# Patient Record
Sex: Female | Born: 1950
Health system: Southern US, Community
[De-identification: ages and names within clinical notes are randomized; demographics above are authoritative.]

## PROBLEM LIST (undated history)

## (undated) DIAGNOSIS — Z8719 Personal history of other diseases of the digestive system: Secondary | ICD-10-CM

## (undated) DIAGNOSIS — C50919 Malignant neoplasm of unspecified site of unspecified female breast: Secondary | ICD-10-CM

## (undated) DIAGNOSIS — Z9011 Acquired absence of right breast and nipple: Secondary | ICD-10-CM

## (undated) DIAGNOSIS — K219 Gastro-esophageal reflux disease without esophagitis: Secondary | ICD-10-CM

## (undated) DIAGNOSIS — M5430 Sciatica, unspecified side: Secondary | ICD-10-CM

## (undated) DIAGNOSIS — G629 Polyneuropathy, unspecified: Secondary | ICD-10-CM

## (undated) DIAGNOSIS — G8929 Other chronic pain: Secondary | ICD-10-CM

## (undated) DIAGNOSIS — E039 Hypothyroidism, unspecified: Secondary | ICD-10-CM

## (undated) DIAGNOSIS — N183 Chronic kidney disease, stage 3 unspecified: Secondary | ICD-10-CM

## (undated) DIAGNOSIS — M545 Low back pain, unspecified: Secondary | ICD-10-CM

## (undated) DIAGNOSIS — M1990 Unspecified osteoarthritis, unspecified site: Secondary | ICD-10-CM

## (undated) DIAGNOSIS — K589 Irritable bowel syndrome without diarrhea: Secondary | ICD-10-CM

## (undated) DIAGNOSIS — G4730 Sleep apnea, unspecified: Secondary | ICD-10-CM

## (undated) DIAGNOSIS — R112 Nausea with vomiting, unspecified: Secondary | ICD-10-CM

## (undated) DIAGNOSIS — Z8489 Family history of other specified conditions: Secondary | ICD-10-CM

## (undated) DIAGNOSIS — Z87442 Personal history of urinary calculi: Secondary | ICD-10-CM

## (undated) DIAGNOSIS — I10 Essential (primary) hypertension: Secondary | ICD-10-CM

## (undated) DIAGNOSIS — K8020 Calculus of gallbladder without cholecystitis without obstruction: Secondary | ICD-10-CM

## (undated) DIAGNOSIS — R001 Bradycardia, unspecified: Secondary | ICD-10-CM

## (undated) DIAGNOSIS — G43909 Migraine, unspecified, not intractable, without status migrainosus: Secondary | ICD-10-CM

## (undated) DIAGNOSIS — Z9889 Other specified postprocedural states: Secondary | ICD-10-CM

## (undated) DIAGNOSIS — I209 Angina pectoris, unspecified: Secondary | ICD-10-CM

## (undated) DIAGNOSIS — E119 Type 2 diabetes mellitus without complications: Secondary | ICD-10-CM

## (undated) HISTORY — PX: MASTECTOMY: SHX3

## (undated) HISTORY — DX: Malignant neoplasm of unspecified site of unspecified female breast: C50.919

## (undated) HISTORY — PX: BACK SURGERY: SHX140

## (undated) HISTORY — DX: Essential (primary) hypertension: I10

## (undated) HISTORY — PX: CARDIAC CATHETERIZATION: SHX172

## (undated) HISTORY — DX: Polyneuropathy, unspecified: G62.9

## (undated) HISTORY — DX: Gastro-esophageal reflux disease without esophagitis: K21.9

## (undated) HISTORY — DX: Chronic kidney disease, stage 3 unspecified: N18.30

## (undated) HISTORY — DX: Hypothyroidism, unspecified: E03.9

## (undated) HISTORY — DX: Unspecified osteoarthritis, unspecified site: M19.90

## (undated) HISTORY — PX: KNEE ARTHROSCOPY: SHX127

---

## 1975-04-12 HISTORY — PX: THYROIDECTOMY: SHX17

## 1988-12-10 HISTORY — PX: KNEE ARTHROSCOPY: SHX127

## 1991-04-12 HISTORY — PX: ABDOMINAL HYSTERECTOMY: SHX81

## 1998-04-11 HISTORY — PX: TOTAL KNEE ARTHROPLASTY: SHX125

## 1998-08-28 ENCOUNTER — Encounter: Payer: Self-pay | Admitting: Orthopedic Surgery

## 1998-08-28 ENCOUNTER — Inpatient Hospital Stay (HOSPITAL_COMMUNITY): Admission: RE | Admit: 1998-08-28 | Discharge: 1998-09-04 | Payer: Self-pay | Admitting: Orthopedic Surgery

## 1998-11-18 ENCOUNTER — Ambulatory Visit (HOSPITAL_BASED_OUTPATIENT_CLINIC_OR_DEPARTMENT_OTHER): Admission: RE | Admit: 1998-11-18 | Discharge: 1998-11-18 | Payer: Self-pay | Admitting: Orthopedic Surgery

## 1999-03-09 ENCOUNTER — Encounter: Payer: Self-pay | Admitting: Family Medicine

## 1999-03-09 ENCOUNTER — Encounter: Admission: RE | Admit: 1999-03-09 | Discharge: 1999-03-09 | Payer: Self-pay | Admitting: Family Medicine

## 1999-04-12 HISTORY — PX: POSTERIOR CERVICAL FUSION/FORAMINOTOMY: SHX5038

## 1999-07-14 ENCOUNTER — Encounter: Payer: Self-pay | Admitting: Orthopedic Surgery

## 1999-07-14 ENCOUNTER — Ambulatory Visit (HOSPITAL_COMMUNITY): Admission: RE | Admit: 1999-07-14 | Discharge: 1999-07-14 | Payer: Self-pay | Admitting: Orthopedic Surgery

## 1999-07-30 ENCOUNTER — Ambulatory Visit (HOSPITAL_COMMUNITY): Admission: RE | Admit: 1999-07-30 | Discharge: 1999-07-30 | Payer: Self-pay | Admitting: Orthopedic Surgery

## 1999-07-30 ENCOUNTER — Encounter: Payer: Self-pay | Admitting: Orthopedic Surgery

## 1999-08-13 ENCOUNTER — Encounter: Payer: Self-pay | Admitting: Orthopedic Surgery

## 1999-08-13 ENCOUNTER — Ambulatory Visit (HOSPITAL_COMMUNITY): Admission: RE | Admit: 1999-08-13 | Discharge: 1999-08-13 | Payer: Self-pay | Admitting: Orthopedic Surgery

## 2000-05-29 ENCOUNTER — Encounter: Admission: RE | Admit: 2000-05-29 | Discharge: 2000-05-29 | Payer: Self-pay | Admitting: Family Medicine

## 2000-05-29 ENCOUNTER — Encounter: Payer: Self-pay | Admitting: Family Medicine

## 2000-06-30 ENCOUNTER — Encounter: Payer: Self-pay | Admitting: Neurosurgery

## 2000-06-30 ENCOUNTER — Ambulatory Visit (HOSPITAL_COMMUNITY): Admission: RE | Admit: 2000-06-30 | Discharge: 2000-06-30 | Payer: Self-pay | Admitting: Neurosurgery

## 2000-07-10 ENCOUNTER — Encounter: Payer: Self-pay | Admitting: Neurosurgery

## 2000-07-10 ENCOUNTER — Ambulatory Visit (HOSPITAL_COMMUNITY): Admission: RE | Admit: 2000-07-10 | Discharge: 2000-07-10 | Payer: Self-pay | Admitting: Neurosurgery

## 2000-08-29 ENCOUNTER — Encounter: Payer: Self-pay | Admitting: Neurosurgery

## 2000-08-31 ENCOUNTER — Encounter: Payer: Self-pay | Admitting: Neurosurgery

## 2000-08-31 ENCOUNTER — Ambulatory Visit (HOSPITAL_COMMUNITY): Admission: RE | Admit: 2000-08-31 | Discharge: 2000-09-01 | Payer: Self-pay | Admitting: Neurosurgery

## 2000-10-03 ENCOUNTER — Encounter: Payer: Self-pay | Admitting: Neurosurgery

## 2000-10-03 ENCOUNTER — Ambulatory Visit (HOSPITAL_COMMUNITY): Admission: RE | Admit: 2000-10-03 | Discharge: 2000-10-03 | Payer: Self-pay | Admitting: Neurosurgery

## 2001-01-05 ENCOUNTER — Encounter: Payer: Self-pay | Admitting: Neurosurgery

## 2001-01-05 ENCOUNTER — Ambulatory Visit (HOSPITAL_COMMUNITY): Admission: RE | Admit: 2001-01-05 | Discharge: 2001-01-05 | Payer: Self-pay | Admitting: Neurosurgery

## 2001-01-18 ENCOUNTER — Encounter: Payer: Self-pay | Admitting: Neurosurgery

## 2001-01-18 ENCOUNTER — Encounter: Admission: RE | Admit: 2001-01-18 | Discharge: 2001-01-18 | Payer: Self-pay | Admitting: Neurosurgery

## 2001-02-01 ENCOUNTER — Ambulatory Visit (HOSPITAL_COMMUNITY): Admission: RE | Admit: 2001-02-01 | Discharge: 2001-02-01 | Payer: Self-pay | Admitting: Neurosurgery

## 2001-04-02 ENCOUNTER — Encounter: Payer: Self-pay | Admitting: *Deleted

## 2001-04-02 ENCOUNTER — Observation Stay (HOSPITAL_COMMUNITY): Admission: EM | Admit: 2001-04-02 | Discharge: 2001-04-03 | Payer: Self-pay | Admitting: *Deleted

## 2001-04-11 HISTORY — PX: SHOULDER ARTHROSCOPY W/ ROTATOR CUFF REPAIR: SHX2400

## 2001-04-11 HISTORY — PX: AXILLARY LYMPH NODE DISSECTION: SHX5229

## 2001-10-26 ENCOUNTER — Encounter: Admission: RE | Admit: 2001-10-26 | Discharge: 2001-10-26 | Payer: Self-pay | Admitting: General Surgery

## 2001-10-26 ENCOUNTER — Encounter: Payer: Self-pay | Admitting: General Surgery

## 2001-10-26 ENCOUNTER — Ambulatory Visit (HOSPITAL_BASED_OUTPATIENT_CLINIC_OR_DEPARTMENT_OTHER): Admission: RE | Admit: 2001-10-26 | Discharge: 2001-10-26 | Payer: Self-pay | Admitting: General Surgery

## 2001-10-26 ENCOUNTER — Encounter (INDEPENDENT_AMBULATORY_CARE_PROVIDER_SITE_OTHER): Payer: Self-pay | Admitting: *Deleted

## 2001-11-07 ENCOUNTER — Encounter: Payer: Self-pay | Admitting: General Surgery

## 2001-11-07 ENCOUNTER — Encounter: Admission: RE | Admit: 2001-11-07 | Discharge: 2001-11-07 | Payer: Self-pay | Admitting: General Surgery

## 2001-12-19 ENCOUNTER — Encounter (INDEPENDENT_AMBULATORY_CARE_PROVIDER_SITE_OTHER): Payer: Self-pay | Admitting: *Deleted

## 2001-12-19 ENCOUNTER — Encounter: Payer: Self-pay | Admitting: General Surgery

## 2001-12-19 ENCOUNTER — Ambulatory Visit (HOSPITAL_COMMUNITY): Admission: RE | Admit: 2001-12-19 | Discharge: 2001-12-19 | Payer: Self-pay | Admitting: Otolaryngology

## 2002-04-25 ENCOUNTER — Encounter: Payer: Self-pay | Admitting: *Deleted

## 2002-04-25 ENCOUNTER — Ambulatory Visit (HOSPITAL_COMMUNITY): Admission: RE | Admit: 2002-04-25 | Discharge: 2002-04-25 | Payer: Self-pay | Admitting: *Deleted

## 2002-05-13 ENCOUNTER — Ambulatory Visit (HOSPITAL_BASED_OUTPATIENT_CLINIC_OR_DEPARTMENT_OTHER): Admission: RE | Admit: 2002-05-13 | Discharge: 2002-05-13 | Payer: Self-pay | Admitting: General Surgery

## 2002-08-20 ENCOUNTER — Ambulatory Visit (HOSPITAL_COMMUNITY): Admission: RE | Admit: 2002-08-20 | Discharge: 2002-08-20 | Payer: Self-pay | Admitting: *Deleted

## 2002-08-20 ENCOUNTER — Encounter: Payer: Self-pay | Admitting: *Deleted

## 2002-11-11 ENCOUNTER — Encounter: Payer: Self-pay | Admitting: *Deleted

## 2002-11-11 ENCOUNTER — Ambulatory Visit (HOSPITAL_COMMUNITY): Admission: RE | Admit: 2002-11-11 | Discharge: 2002-11-11 | Payer: Self-pay | Admitting: *Deleted

## 2003-02-21 ENCOUNTER — Ambulatory Visit (HOSPITAL_COMMUNITY): Admission: RE | Admit: 2003-02-21 | Discharge: 2003-02-21 | Payer: Self-pay | Admitting: Hematology & Oncology

## 2003-04-12 HISTORY — PX: ESOPHAGOGASTRODUODENOSCOPY: SHX1529

## 2003-05-19 ENCOUNTER — Ambulatory Visit (HOSPITAL_COMMUNITY): Admission: RE | Admit: 2003-05-19 | Discharge: 2003-05-19 | Payer: Self-pay | Admitting: Family Medicine

## 2003-05-23 ENCOUNTER — Ambulatory Visit (HOSPITAL_COMMUNITY): Admission: RE | Admit: 2003-05-23 | Discharge: 2003-05-23 | Payer: Self-pay | Admitting: Oncology

## 2003-06-02 ENCOUNTER — Encounter: Admission: RE | Admit: 2003-06-02 | Discharge: 2003-06-02 | Payer: Self-pay | Admitting: Emergency Medicine

## 2003-06-10 LAB — HM COLONOSCOPY

## 2003-06-12 ENCOUNTER — Ambulatory Visit (HOSPITAL_COMMUNITY): Admission: RE | Admit: 2003-06-12 | Discharge: 2003-06-12 | Payer: Self-pay | Admitting: Gastroenterology

## 2003-07-23 ENCOUNTER — Encounter: Admission: RE | Admit: 2003-07-23 | Discharge: 2003-07-23 | Payer: Self-pay | Admitting: Emergency Medicine

## 2003-07-30 ENCOUNTER — Encounter (INDEPENDENT_AMBULATORY_CARE_PROVIDER_SITE_OTHER): Payer: Self-pay | Admitting: *Deleted

## 2003-07-30 ENCOUNTER — Ambulatory Visit (HOSPITAL_COMMUNITY): Admission: RE | Admit: 2003-07-30 | Discharge: 2003-07-30 | Payer: Self-pay | Admitting: Endocrinology

## 2003-09-04 ENCOUNTER — Encounter: Admission: RE | Admit: 2003-09-04 | Discharge: 2003-09-04 | Payer: Self-pay | Admitting: Oncology

## 2003-10-07 ENCOUNTER — Encounter: Admission: RE | Admit: 2003-10-07 | Discharge: 2004-01-05 | Payer: Self-pay | Admitting: Endocrinology

## 2003-12-09 ENCOUNTER — Encounter: Admission: RE | Admit: 2003-12-09 | Discharge: 2003-12-09 | Payer: Self-pay | Admitting: General Surgery

## 2003-12-09 ENCOUNTER — Encounter (INDEPENDENT_AMBULATORY_CARE_PROVIDER_SITE_OTHER): Payer: Self-pay | Admitting: *Deleted

## 2003-12-11 HISTORY — PX: MASTECTOMY WITH AXILLARY LYMPH NODE DISSECTION: SHX5661

## 2003-12-22 ENCOUNTER — Encounter: Admission: RE | Admit: 2003-12-22 | Discharge: 2003-12-22 | Payer: Self-pay | Admitting: General Surgery

## 2003-12-29 ENCOUNTER — Ambulatory Visit (HOSPITAL_COMMUNITY): Admission: RE | Admit: 2003-12-29 | Discharge: 2003-12-30 | Payer: Self-pay | Admitting: General Surgery

## 2003-12-29 ENCOUNTER — Encounter (INDEPENDENT_AMBULATORY_CARE_PROVIDER_SITE_OTHER): Payer: Self-pay | Admitting: *Deleted

## 2004-01-10 ENCOUNTER — Emergency Department (HOSPITAL_COMMUNITY): Admission: EM | Admit: 2004-01-10 | Discharge: 2004-01-10 | Payer: Self-pay

## 2004-02-05 ENCOUNTER — Encounter: Admission: RE | Admit: 2004-02-05 | Discharge: 2004-02-05 | Payer: Self-pay | Admitting: Endocrinology

## 2004-02-18 ENCOUNTER — Ambulatory Visit: Payer: Self-pay | Admitting: Oncology

## 2004-03-24 ENCOUNTER — Ambulatory Visit (HOSPITAL_COMMUNITY): Admission: RE | Admit: 2004-03-24 | Discharge: 2004-03-24 | Payer: Self-pay | Admitting: Oncology

## 2004-03-29 ENCOUNTER — Ambulatory Visit (HOSPITAL_COMMUNITY): Admission: RE | Admit: 2004-03-29 | Discharge: 2004-03-29 | Payer: Self-pay | Admitting: General Surgery

## 2004-04-15 ENCOUNTER — Ambulatory Visit: Payer: Self-pay | Admitting: Oncology

## 2004-04-21 ENCOUNTER — Emergency Department (HOSPITAL_COMMUNITY): Admission: EM | Admit: 2004-04-21 | Discharge: 2004-04-22 | Payer: Self-pay | Admitting: Emergency Medicine

## 2004-04-22 ENCOUNTER — Inpatient Hospital Stay (HOSPITAL_COMMUNITY): Admission: EM | Admit: 2004-04-22 | Discharge: 2004-04-26 | Payer: Self-pay | Admitting: Oncology

## 2004-04-22 ENCOUNTER — Ambulatory Visit: Payer: Self-pay | Admitting: Hematology & Oncology

## 2004-06-02 ENCOUNTER — Ambulatory Visit: Payer: Self-pay | Admitting: Oncology

## 2004-06-09 ENCOUNTER — Ambulatory Visit: Payer: Self-pay | Admitting: Family Medicine

## 2004-06-10 ENCOUNTER — Ambulatory Visit: Payer: Self-pay | Admitting: Oncology

## 2004-07-05 ENCOUNTER — Ambulatory Visit: Admission: RE | Admit: 2004-07-05 | Discharge: 2004-08-25 | Payer: Self-pay | Admitting: Radiation Oncology

## 2004-07-12 ENCOUNTER — Ambulatory Visit: Payer: Self-pay | Admitting: Family Medicine

## 2004-07-28 ENCOUNTER — Ambulatory Visit: Payer: Self-pay | Admitting: Oncology

## 2004-08-11 ENCOUNTER — Ambulatory Visit: Payer: Self-pay | Admitting: Oncology

## 2004-08-25 ENCOUNTER — Ambulatory Visit: Payer: Self-pay | Admitting: Family Medicine

## 2004-08-25 ENCOUNTER — Encounter: Admission: RE | Admit: 2004-08-25 | Discharge: 2004-08-25 | Payer: Self-pay | Admitting: Family Medicine

## 2004-08-30 ENCOUNTER — Ambulatory Visit (HOSPITAL_COMMUNITY): Admission: RE | Admit: 2004-08-30 | Discharge: 2004-08-30 | Payer: Self-pay | Admitting: Oncology

## 2004-10-13 ENCOUNTER — Ambulatory Visit: Payer: Self-pay | Admitting: Family Medicine

## 2004-10-18 ENCOUNTER — Ambulatory Visit: Payer: Self-pay | Admitting: Oncology

## 2004-10-19 ENCOUNTER — Ambulatory Visit (HOSPITAL_COMMUNITY): Admission: RE | Admit: 2004-10-19 | Discharge: 2004-10-19 | Payer: Self-pay | Admitting: Oncology

## 2004-11-25 ENCOUNTER — Ambulatory Visit: Payer: Self-pay | Admitting: Family Medicine

## 2004-11-29 ENCOUNTER — Ambulatory Visit: Payer: Self-pay | Admitting: Family Medicine

## 2004-12-24 ENCOUNTER — Ambulatory Visit: Payer: Self-pay | Admitting: Oncology

## 2004-12-29 ENCOUNTER — Ambulatory Visit (HOSPITAL_COMMUNITY): Admission: RE | Admit: 2004-12-29 | Discharge: 2004-12-29 | Payer: Self-pay | Admitting: Oncology

## 2005-01-24 ENCOUNTER — Ambulatory Visit: Payer: Self-pay | Admitting: Family Medicine

## 2005-01-25 ENCOUNTER — Ambulatory Visit: Payer: Self-pay | Admitting: Oncology

## 2005-01-26 ENCOUNTER — Encounter (HOSPITAL_COMMUNITY): Admission: RE | Admit: 2005-01-26 | Discharge: 2005-04-05 | Payer: Self-pay | Admitting: General Surgery

## 2005-01-28 ENCOUNTER — Ambulatory Visit: Payer: Self-pay | Admitting: Family Medicine

## 2005-02-15 ENCOUNTER — Ambulatory Visit: Payer: Self-pay | Admitting: Oncology

## 2005-03-10 ENCOUNTER — Ambulatory Visit: Payer: Self-pay | Admitting: Family Medicine

## 2005-03-11 ENCOUNTER — Ambulatory Visit: Payer: Self-pay | Admitting: Family Medicine

## 2005-03-11 ENCOUNTER — Ambulatory Visit: Payer: Self-pay | Admitting: Cardiovascular Disease

## 2005-03-17 ENCOUNTER — Ambulatory Visit: Payer: Self-pay | Admitting: Family Medicine

## 2005-03-18 ENCOUNTER — Ambulatory Visit (HOSPITAL_COMMUNITY): Admission: RE | Admit: 2005-03-18 | Discharge: 2005-03-18 | Payer: Self-pay | Admitting: Oncology

## 2005-05-05 ENCOUNTER — Ambulatory Visit: Payer: Self-pay | Admitting: Family Medicine

## 2005-05-24 ENCOUNTER — Ambulatory Visit: Payer: Self-pay | Admitting: Oncology

## 2005-06-08 ENCOUNTER — Ambulatory Visit: Payer: Self-pay | Admitting: Family Medicine

## 2005-06-14 IMAGING — CT CT ABDOMEN W/ CM
1 of 4 series · 11 of 32 positions shown, 17 images · IV contrast (omnipaque)
Comparison: 04/25/02.
COMPARISON: 11/11/02.
COMPARISON: 11/11/02.

CLINICAL DATA: Restaging head and neck cancer.
CT SCAN NECK, CHEST, ABDOMEN, AND PELVIS WITH INTRAVENOUS AND ORAL CONTRAST
TECHNIQUE: 150 cc Omnipaque 300 was utilized.  
CT NECK WITH CONTRAST

[Series 3: cap 5.0 b30f · axial · 0.70mm/px · z∈[+1228,+1738]mm · 11 of 122 slices shown, 17 images]
[im 10/122  soft-tissue]
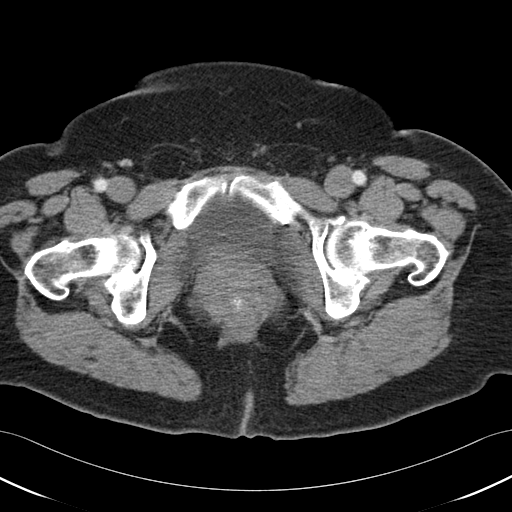
[im 10/122  bone]
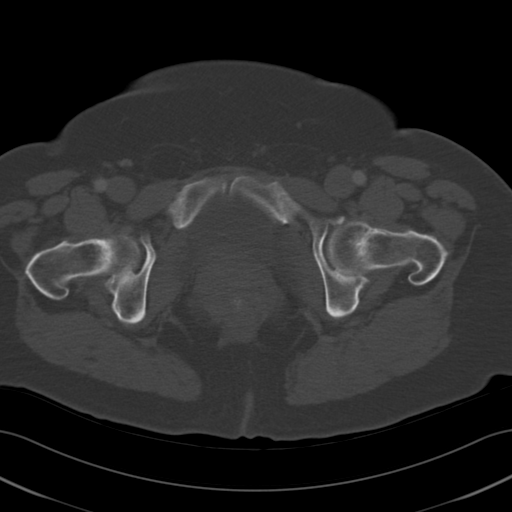
[im 19/122  soft-tissue]
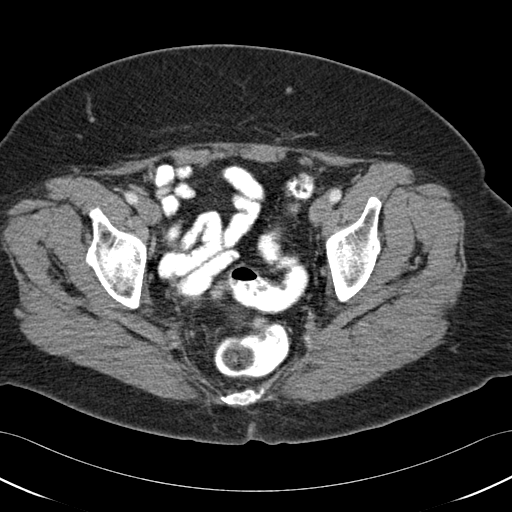
[im 28/122  soft-tissue]
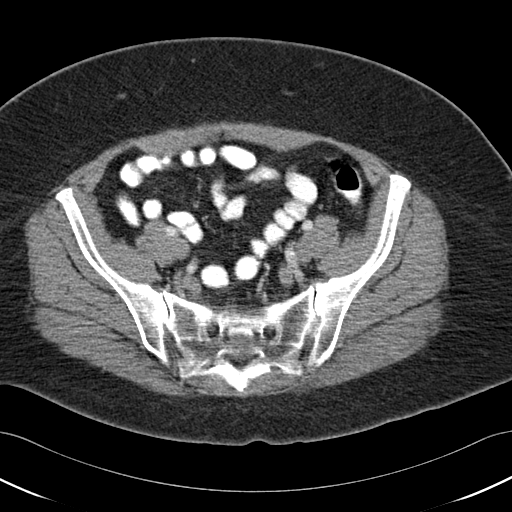
[im 38/122  soft-tissue]
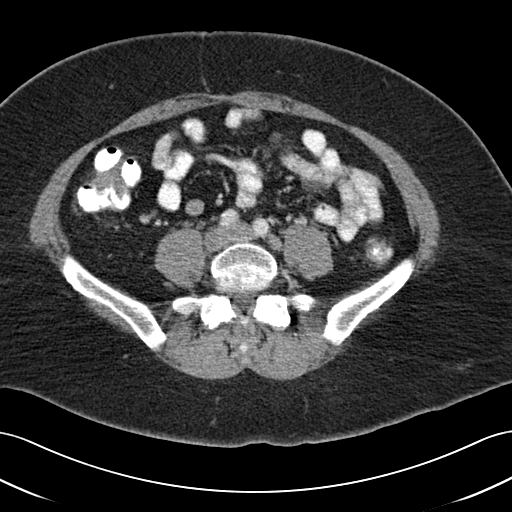
[im 47/122  soft-tissue]
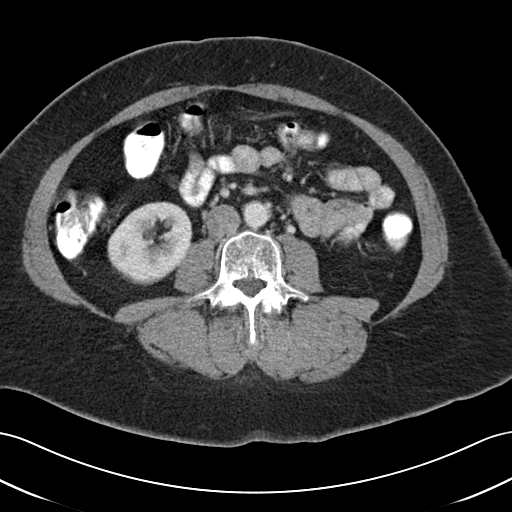
[im 66/122  soft-tissue]
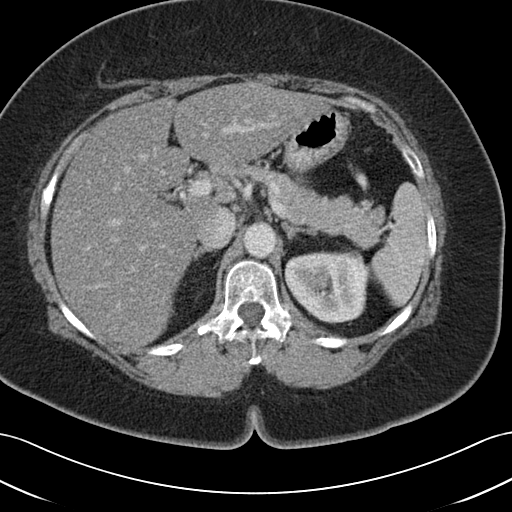
[im 75/122  soft-tissue]
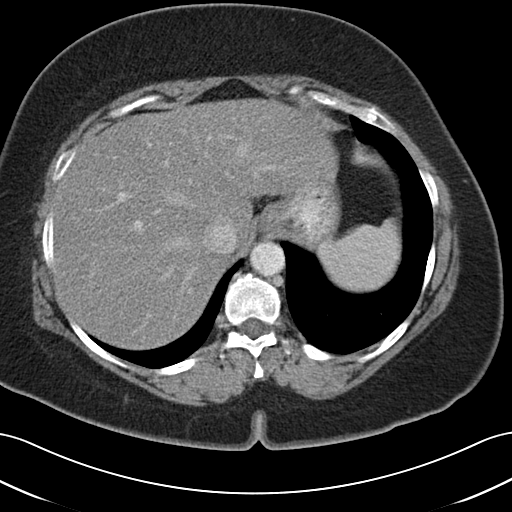
[im 84/122  soft-tissue]
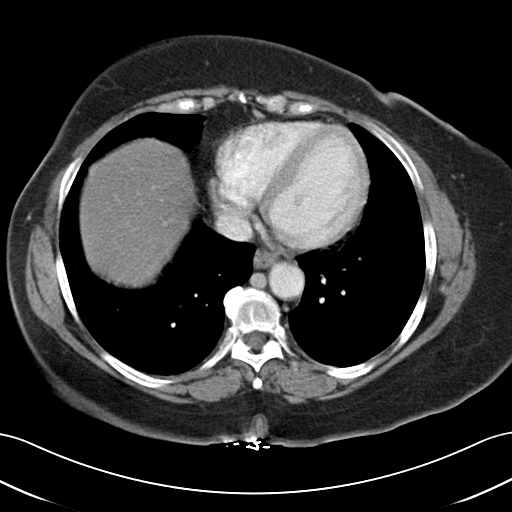
[im 84/122  lung]
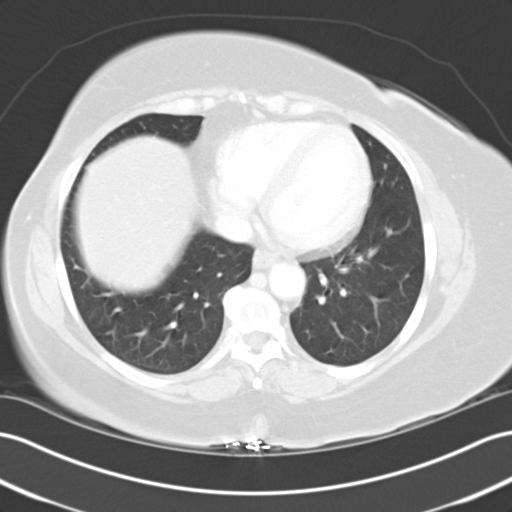
[im 94/122  soft-tissue]
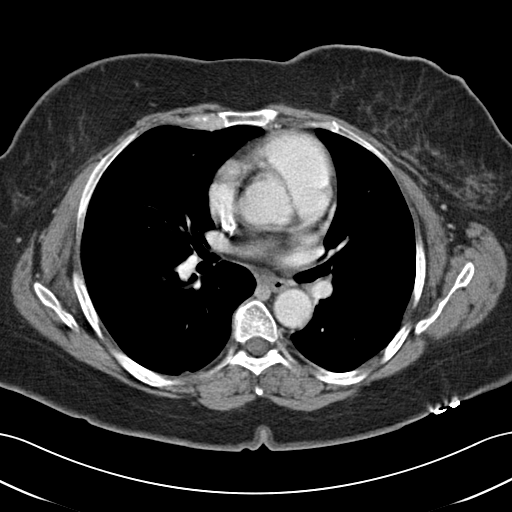
[im 94/122  lung]
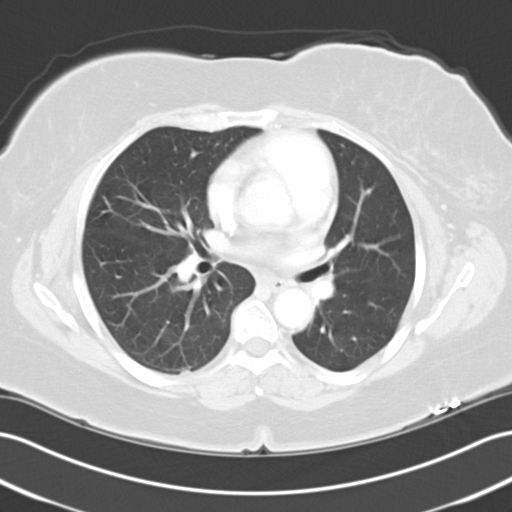
[im 94/122  bone]
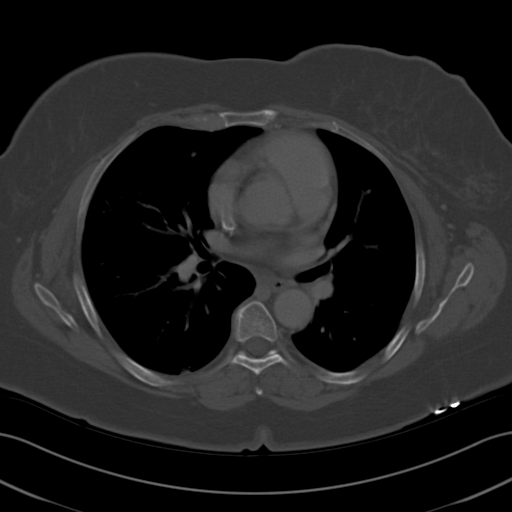
[im 103/122  soft-tissue]
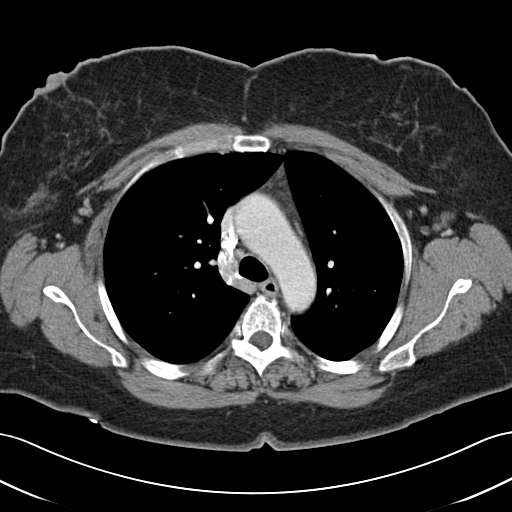
[im 103/122  lung]
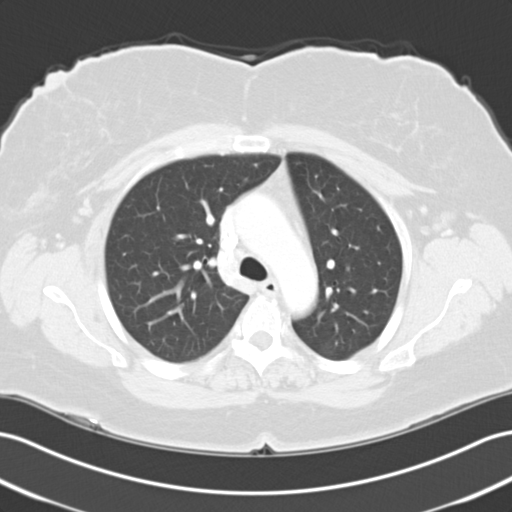
[im 112/122  soft-tissue]
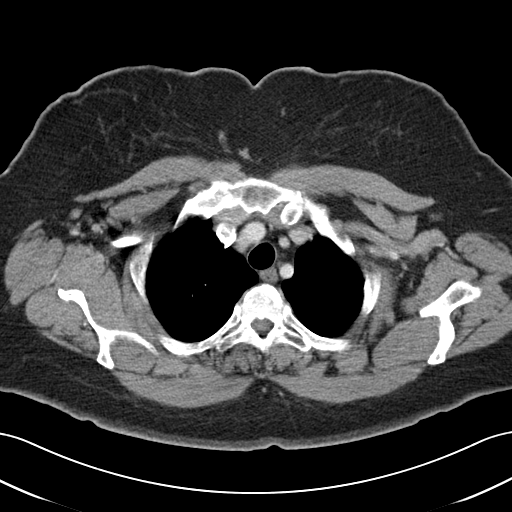
[im 112/122  lung]
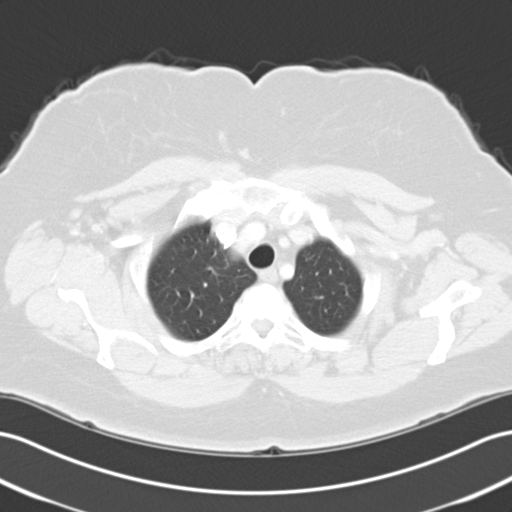

[11 of 32 positions shown; findings below may reference images not displayed]

FINDINGS: Status post fusion at C4-5 level.  Right-sided C3-4 broad-based bony spur with mild right-sided neural foraminal narrowing and spinal stenosis.  Prominent left anterior osteophyte encroaches upon the course of the esophagus.  No bony destructive lesion.  Status post right thyroidectomy.  
  Asymmetric appearance of the glottic region with slight fullness along the left vocal cords.  If the patient's primary malignancy has not been detected and this area has not been directly visualized, one may consider such to exclude vocal cord region as primary malignancy.  Visualized intracranial structures unremarkable.  Symmetric appearance of posterior superior nasopharynx.  Preservation of fat planes in parapharyngeal region.  Parotid glands and submandibular glands unremarkable.  Tongue base symmetrical.  Scattered shotty lymph nodes with the largest solitary lymph node in left submandibular region with maximal transverse dimensions of 1.2 x 0.9 cm.  
IMPRESSION
1.  Asymmetric appearance of glottic region with slight fullness on the left.  If primary malignancy has not been detected, then one may consider direct visualization of this region to determine if this may represent the patient's primary malignancy.
2.  Scattered shotty lymph nodes with the largest in left submandibular region measuring 1.2 x 0.9 cm.
3.  Postoperative changes in cervical spine as described above.  
CT CHEST WITH CONTRAST
Stable appearance of slight pleural thickening in posterior aspect of right lower lung zone extending over 6.2 mm and 3 mm in thickness.  No other focal parenchymal abnormality detected.  Normal size hilar/mediastinal lymph nodes.  Left axillary lymph node measures 1.2 x 0.8 mm and appears minimally more prominent than that on prior exam.  n the right axilla, largest solitary lymph node measures 1.1 x 0.9 cm.  
IMPRESSION
Axillary lymph nodes appear minimally more prominent than on the prior exam.  Otherwise stable examination. 
CT ABDOMEN WITH CONTRAST
Slightly fatty liver without focal lesion identified.  Within the left kidney there are 3 renal calculi, the largest located in mid to upper pole region measuring 6 x 5 mm with 2 smaller calculi in lower pole region.  Kidneys otherwise unremarkable.  Adrenal glands, spleen, pancreas, and abdominal aorta unremarkable.  No evidence of abnormal inflammatory process or fluid collection.  No evidence of mesenteric or paraaortic adenopathy.  
IMPRESSION
1.  Three left renal calculi.
2.  Fatty liver.
3.  No findings of metastatic disease in the abdomen.
CT PELVIS WITH CONTRAST 
No evidence of pelvic adenopathy.  No evidence of abnormal inflammatory process or fluid collection.  Degenerative changes in lower lumbar spine without destructive lesion detected.  
IMPRESSION
1.  No evidence of metastatic disease in the region of the pelvis.
2.  Mild degenerative changes in lumbar spine.

## 2005-07-03 ENCOUNTER — Observation Stay (HOSPITAL_COMMUNITY): Admission: EM | Admit: 2005-07-03 | Discharge: 2005-07-05 | Payer: Self-pay | Admitting: Emergency Medicine

## 2005-07-03 ENCOUNTER — Ambulatory Visit: Payer: Self-pay | Admitting: Internal Medicine

## 2005-07-04 ENCOUNTER — Ambulatory Visit: Payer: Self-pay | Admitting: Cardiology

## 2005-07-04 ENCOUNTER — Encounter: Payer: Self-pay | Admitting: Cardiology

## 2005-07-06 ENCOUNTER — Ambulatory Visit: Payer: Self-pay | Admitting: Family Medicine

## 2005-07-10 HISTORY — PX: OTHER SURGICAL HISTORY: SHX169

## 2005-07-18 ENCOUNTER — Ambulatory Visit: Payer: Self-pay | Admitting: Cardiovascular Disease

## 2005-07-20 ENCOUNTER — Ambulatory Visit: Payer: Self-pay | Admitting: Family Medicine

## 2005-07-20 ENCOUNTER — Ambulatory Visit: Payer: Self-pay | Admitting: Oncology

## 2005-07-21 ENCOUNTER — Encounter: Admission: RE | Admit: 2005-07-21 | Discharge: 2005-07-21 | Payer: Self-pay | Admitting: Endocrinology

## 2005-07-22 ENCOUNTER — Encounter: Admission: RE | Admit: 2005-07-22 | Discharge: 2005-07-22 | Payer: Self-pay | Admitting: Family Medicine

## 2005-07-28 ENCOUNTER — Ambulatory Visit: Payer: Self-pay

## 2005-08-08 ENCOUNTER — Ambulatory Visit: Payer: Self-pay | Admitting: Cardiovascular Disease

## 2005-08-10 ENCOUNTER — Inpatient Hospital Stay (HOSPITAL_BASED_OUTPATIENT_CLINIC_OR_DEPARTMENT_OTHER): Admission: RE | Admit: 2005-08-10 | Discharge: 2005-08-10 | Payer: Self-pay | Admitting: Cardiovascular Disease

## 2005-08-10 ENCOUNTER — Ambulatory Visit: Payer: Self-pay | Admitting: Cardiovascular Disease

## 2005-08-16 ENCOUNTER — Ambulatory Visit: Payer: Self-pay | Admitting: Cardiovascular Disease

## 2005-08-23 ENCOUNTER — Ambulatory Visit: Payer: Self-pay | Admitting: Family Medicine

## 2005-09-06 ENCOUNTER — Ambulatory Visit: Payer: Self-pay | Admitting: Family Medicine

## 2005-09-09 IMAGING — CT CT CHEST W/ CM
1 of 5 series · 10 of 32 positions shown, 16 images · IV contrast (omnipaque)
Comparison: none

CLINICAL DATA: Follow up squamous cell carcinoma with an unknown primary.
CT NECK WITH CONTRAST 05/19/03
TECHNIQUE: Helical transaxial images of the neck, chest, abdomen, and pelvis were obtained with oral contrast and 150 cc of Omnipaque 300 intravenous contrast.  These are compared to the previous examinations dated 02/21/03.  
There has been no significant change in mildly prominent lymph nodes with no abnormally enlarged lymph nodes.  The largest lymph node is a left submandibular jugulodigastric lymph node measuring 1.2 x 1.0 cm in maximum dimensions, representing no significant change.  No mass is seen, and no abnormal glottic fullness is demonstrated at this time.  Again demonstrated is probable surgical absence of the right lobe and isthmus of the thyroid gland.  Currently, there is a suggestion of a 1.1 cm low density nodule in the left lobe.  This is difficult to determine due to streak artifacts in that region.  Cervical spine degenerative changes area again demonstrated as well as anterior screw and plate fixation of the lower cervical spine. 
IMPRESSION
1.  Stable mildly prominent lymph nodes with no definite abnormally enlarged lymph nodes.
2.  No mass demonstrated. 
3.  Possible 1.1 cm left lobe thyroid nodule.  This could be artifactual due to streak artifacts in that region.  This could be better determined with thyroid ultrasound.
4.  Probable surgical absence of the right lobe and isthmus of the thyroid gland. 
5.  Cervical spine degenerative and postoperative changes. 
CT CHEST WITH CONTRAST 05/19/03 
The streak artifacts, seen in the region of the thyroid gland on the neck CT, are not present on the chest CT images.  There is a small area of ill-defined low density in the left lobe of the thyroid gland.  This has a seagull configuration.  There is also a suggestion of a small rounded low density nodule in the left lobe of the thyroid gland.  No definitely abnormally enlarged lymph nodes.  The previously noted mildly prominent bilateral axillary lymph nodes are unchanged.  No lung masses.  Thoracic spine degenerative changes.  
1.  Possible small left lobe thyroid nodules.  Again, this could be better determined with thyroid ultrasound.
2.  Probable surgical absence of the right lobe and isthmus of the thyroid gland.  
3.  Stable mildly prominent bilateral axillary lymph nodes with no definitely abnormally enlarged lymph nodes.
4.  No evidence of metastatic disease in the chest. 
CT ABDOMEN WITH CONTRAST 05/19/03 
Stable diffuse fatty infiltration of the liver.  Stable small left renal parapelvic cysts.  The previously-demonstrated small lower pole left renal calculus is no longer seen.  Two additional small left renal calculi are unchanged.  Unremarkable gallbladder, spleen, pancreas, and adrenal glands.  No gastrointestinal abnormalities or enlarged lymph nodes.  No evidence of bony metastatic disease. 
1.  Stable diffuse fatty infiltration of the liver.
2.  Absence of a previously-demonstrated small lower pole left renal calculus.  Two additional small left renal calculi are unchanged. 
3.  Stable small left renal parapelvic cysts.
4.  No evidence of metastatic disease in the abdomen. 
CT PELVIS WITH CONTRAST 05/19/03 
No masses or enlarged lymph nodes.  Unremarkable bones. 
No acute pelvic abnormality and no evidence of metastatic disease in the pelvis.

[Series 3: cap 5.0 b10f · axial · 0.74mm/px · z∈[-677,-162]mm · 10 of 127 slices shown, 16 images]
[im 12/127  soft-tissue]
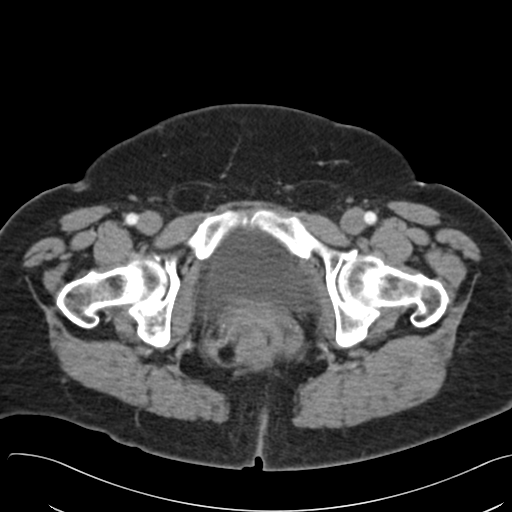
[im 12/127  bone]
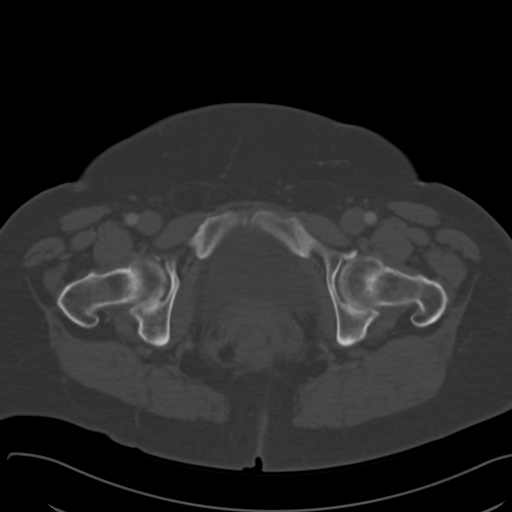
[im 23/127  soft-tissue]
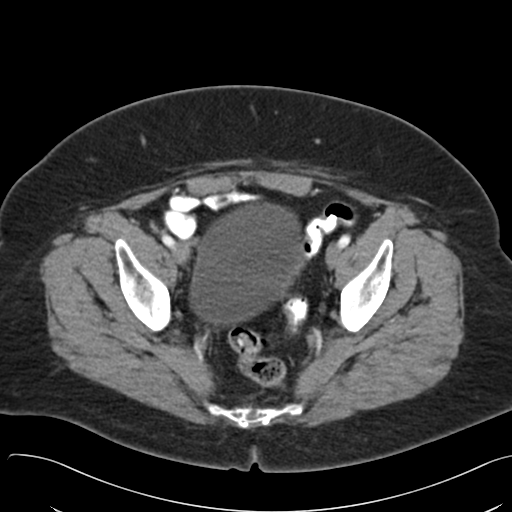
[im 35/127  soft-tissue]
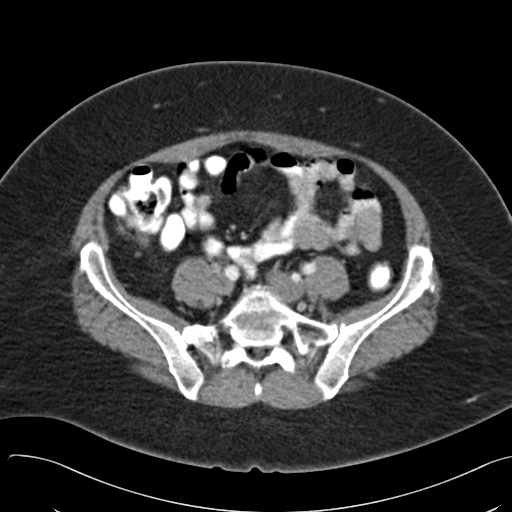
[im 46/127  soft-tissue]
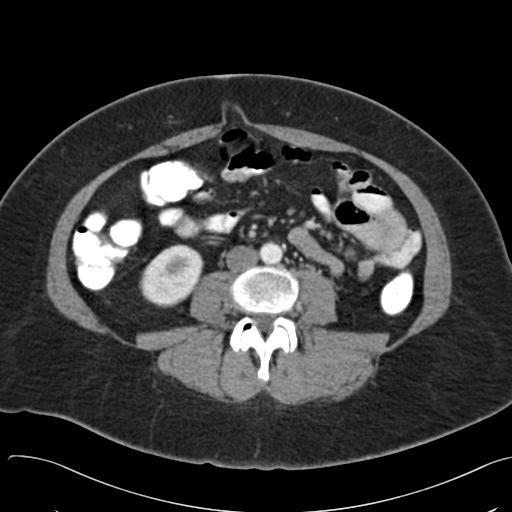
[im 58/127  soft-tissue]
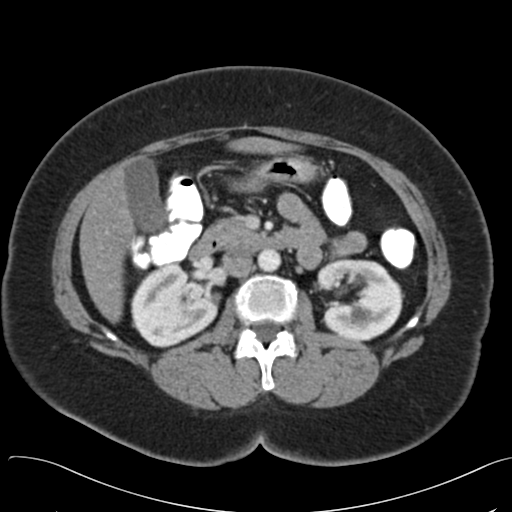
[im 69/127  soft-tissue]
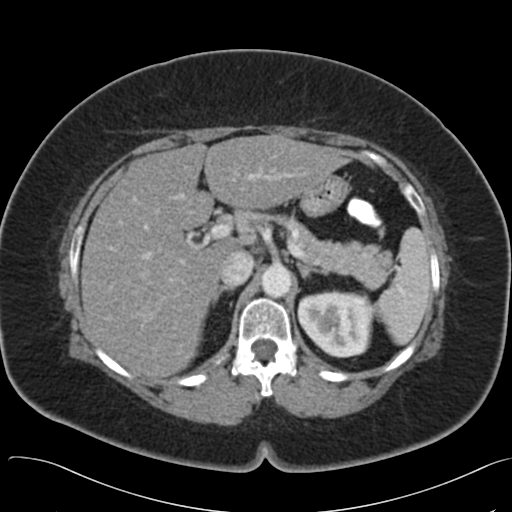
[im 81/127  soft-tissue]
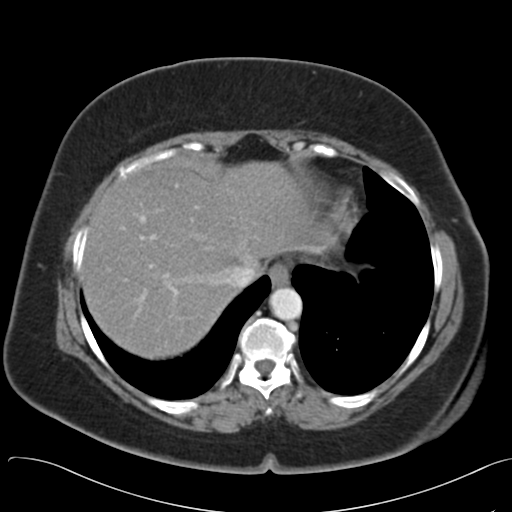
[im 81/127  lung]
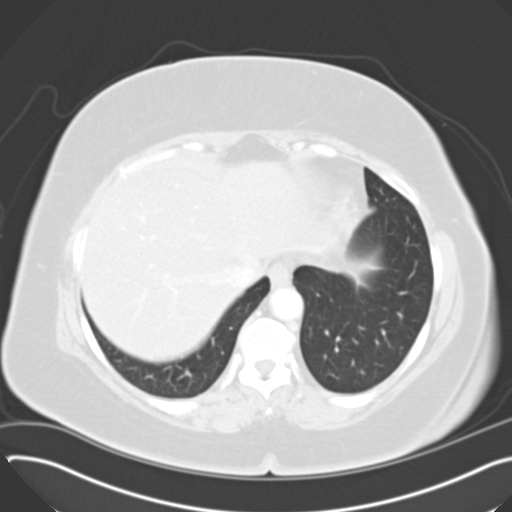
[im 92/127  soft-tissue]
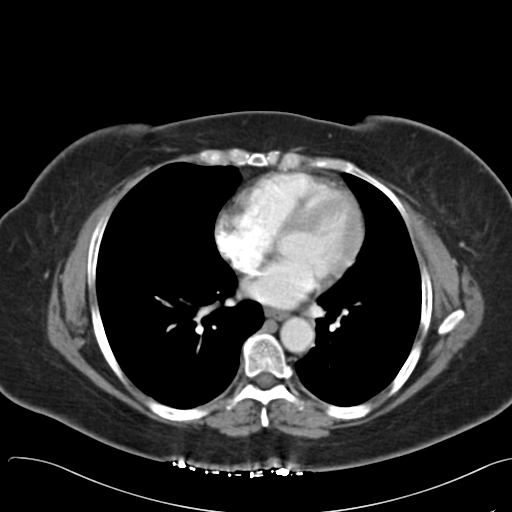
[im 92/127  lung]
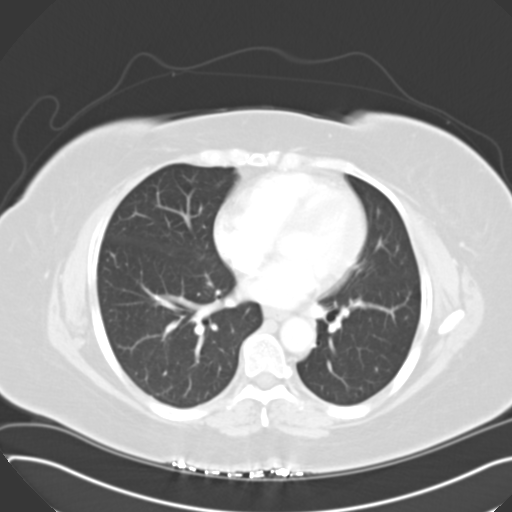
[im 104/127  soft-tissue]
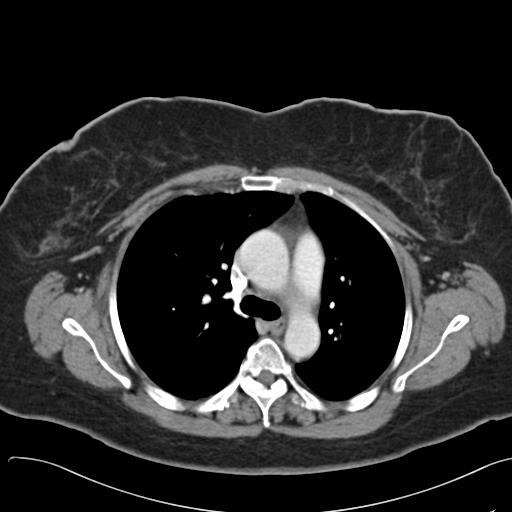
[im 104/127  lung]
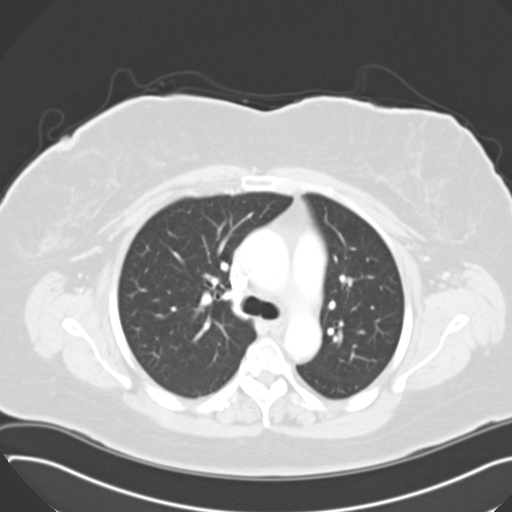
[im 104/127  bone]
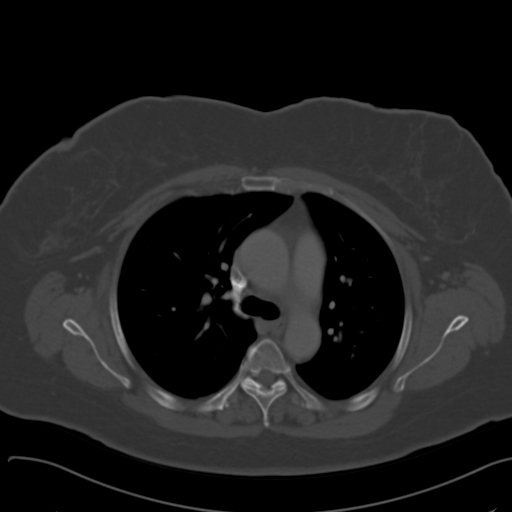
[im 115/127  soft-tissue]
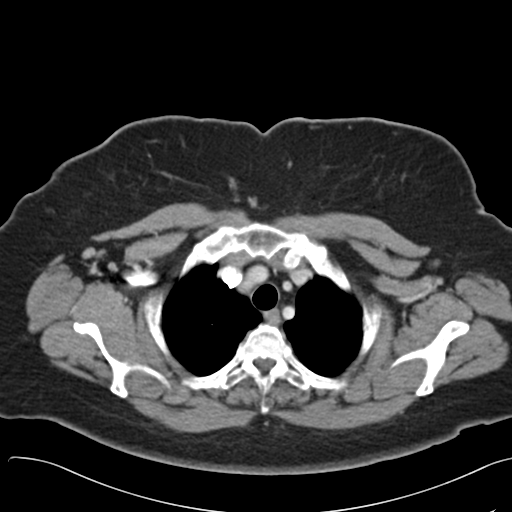
[im 115/127  lung]
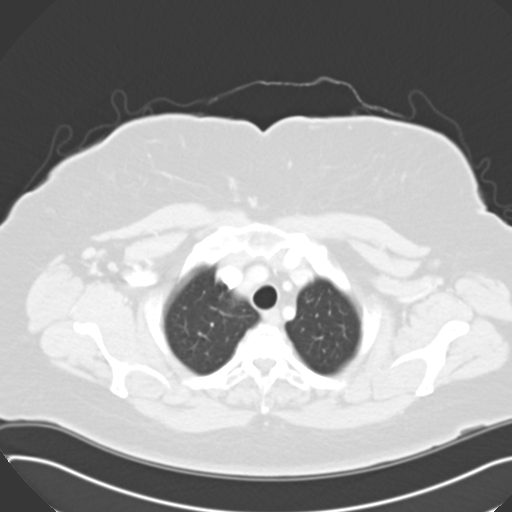

[10 of 32 positions shown; findings below may reference images not displayed]

## 2005-09-13 IMAGING — US US SOFT TISSUE HEAD/NECK
1 series · 14 of 25 positions shown · non-contrast
Comparison: none

CLINICAL DATA: Left thyroid nodule.  The patient is status post right thyroidectomy.
 THYROID ULTRASOUND 
 The right thyroid lobe has been removed.  The left lobe measures 5.4 x 1.8 x 1.9 cm with a normal isthmus.  There is a 1.8 x 1.3 x 1 cm solid nodule in the upper pole of the left lobe.  There is a 4 mm cyst in the lower pole.
 IMPRESSION
 1.  1.8 cm in maximum diameter solid nodule in the upper pole of the left lobe of the thyroid.
 2.  Status post right thyroidectomy.

[Series 1: unknown · 0.09mm/px · 14 of 34 slices shown]
[im 1/34]
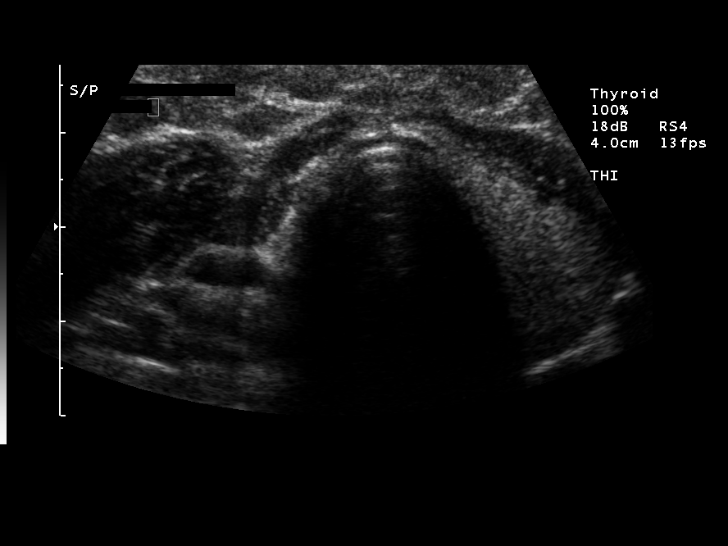
[im 3/34]
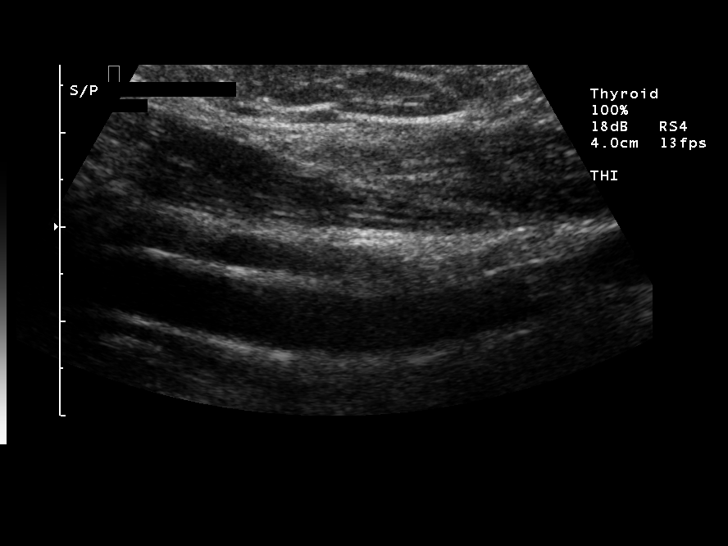
[im 6/34]
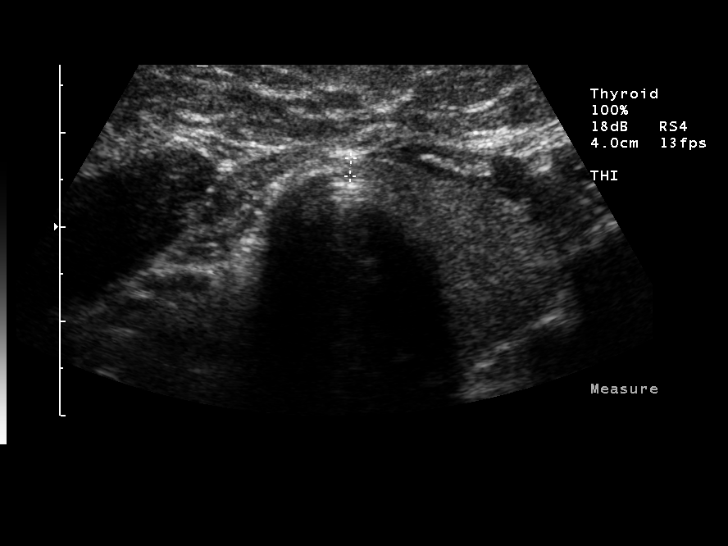
[im 9/34]
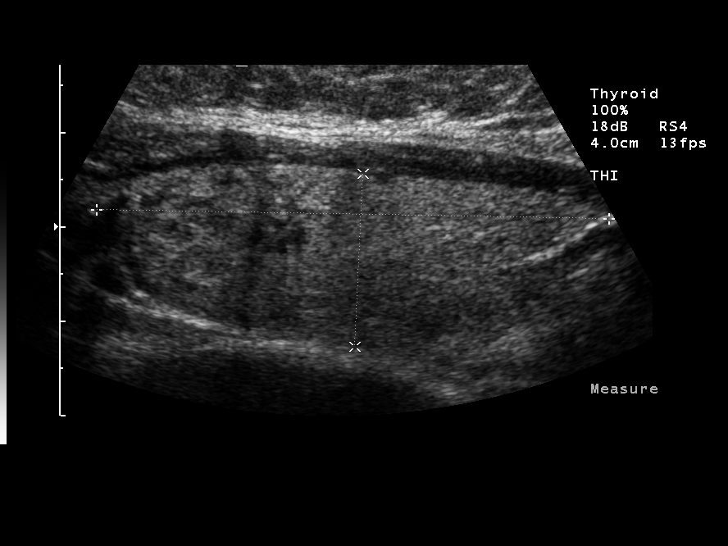
[im 12/34]
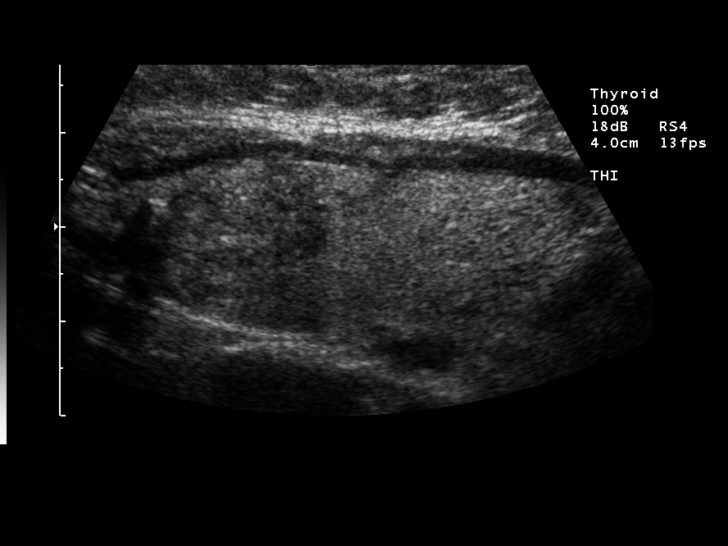
[im 13/34]
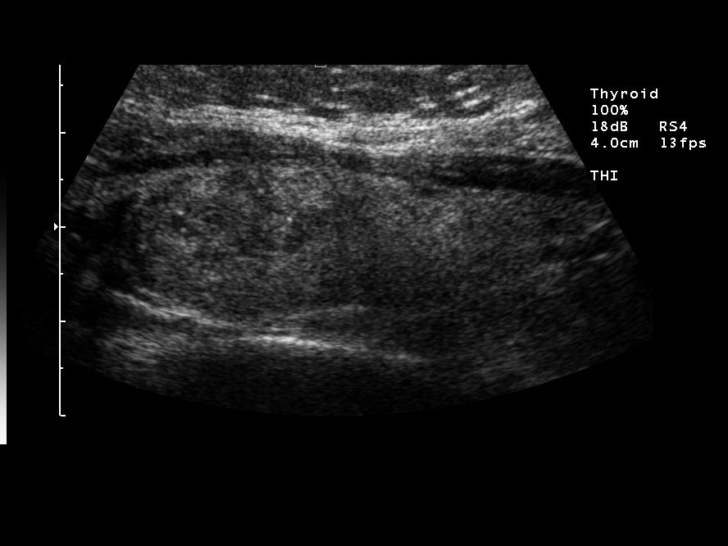
[im 16/34]
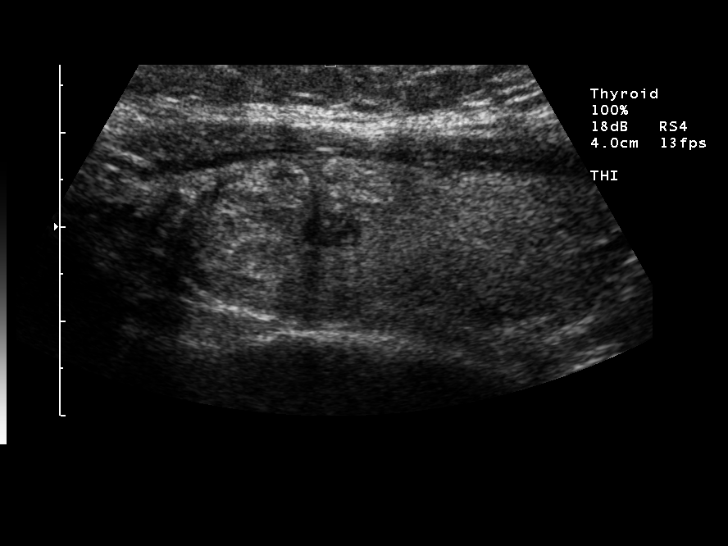
[im 18/34]
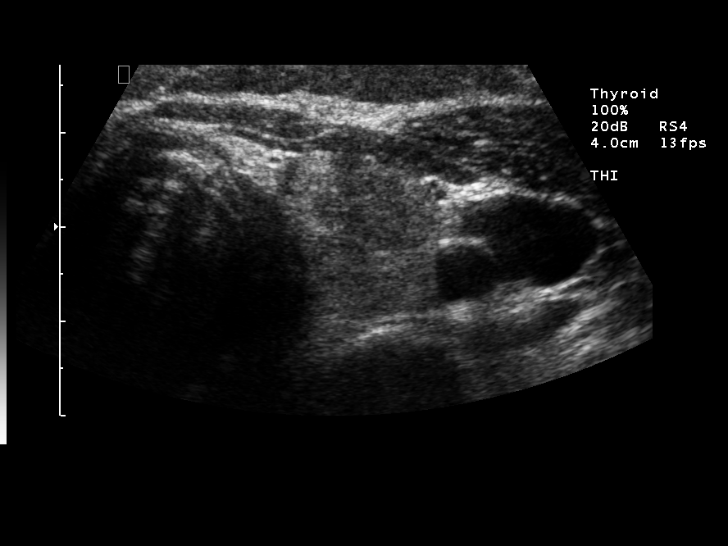
[im 21/34]
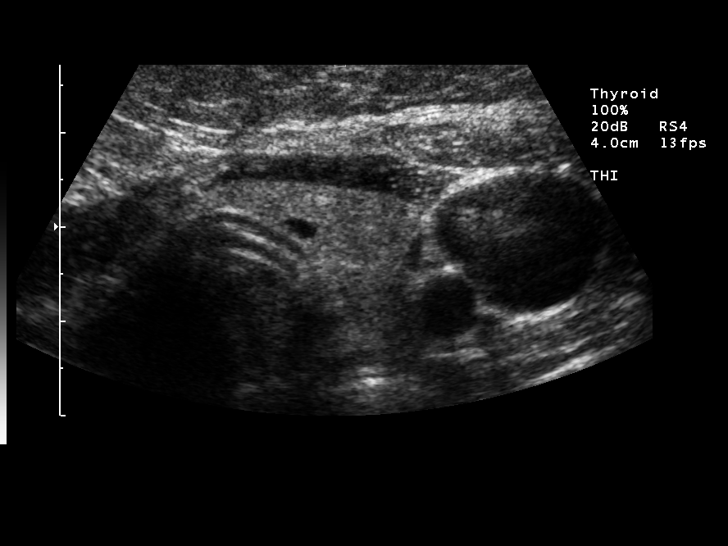
[im 23/34]
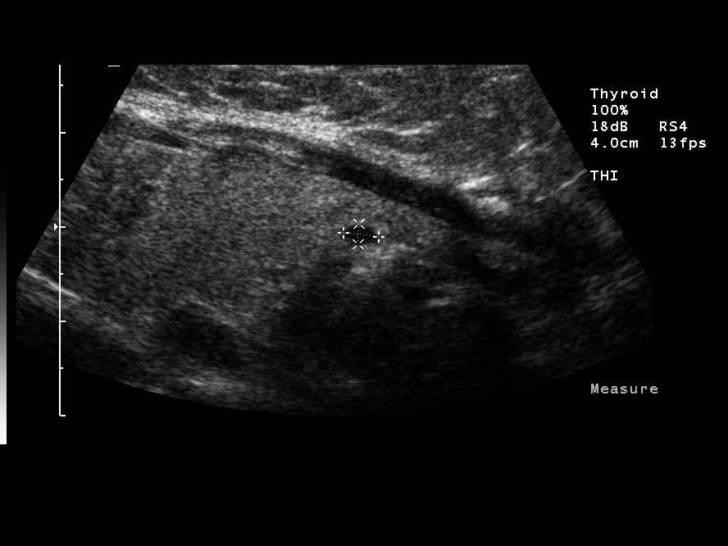
[im 25/34]
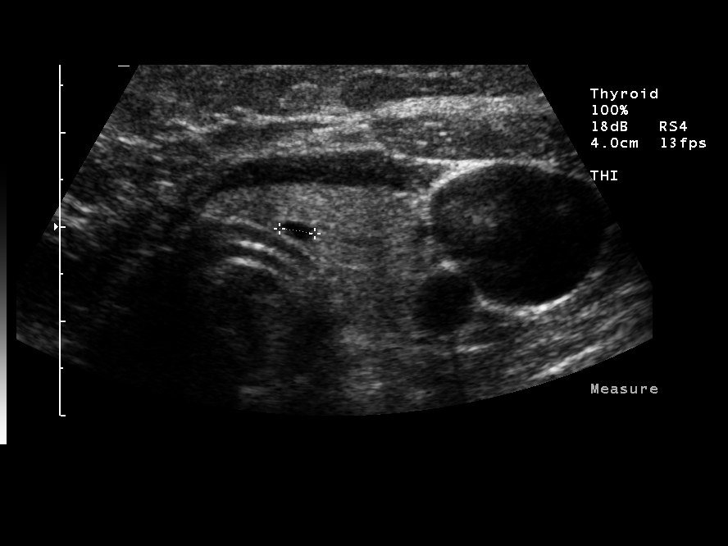
[im 28/34]
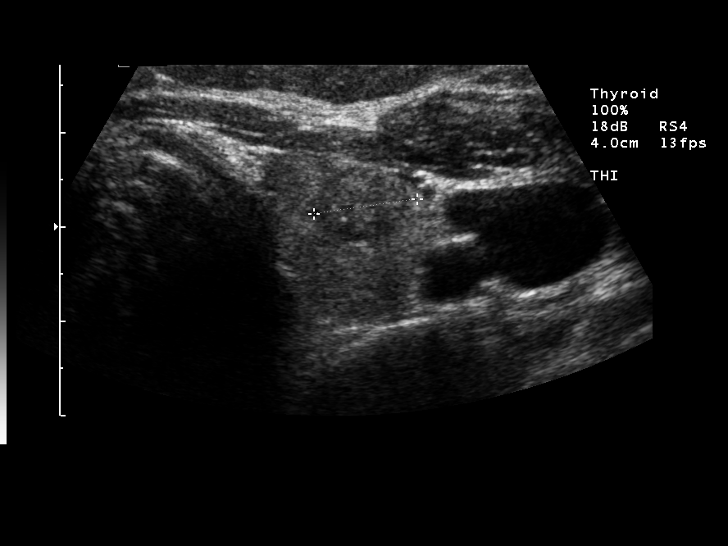
[im 31/34]
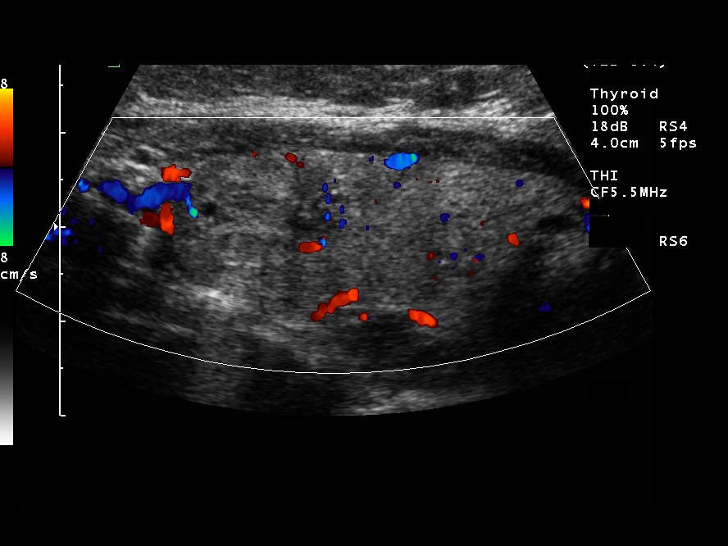
[im 34/34]
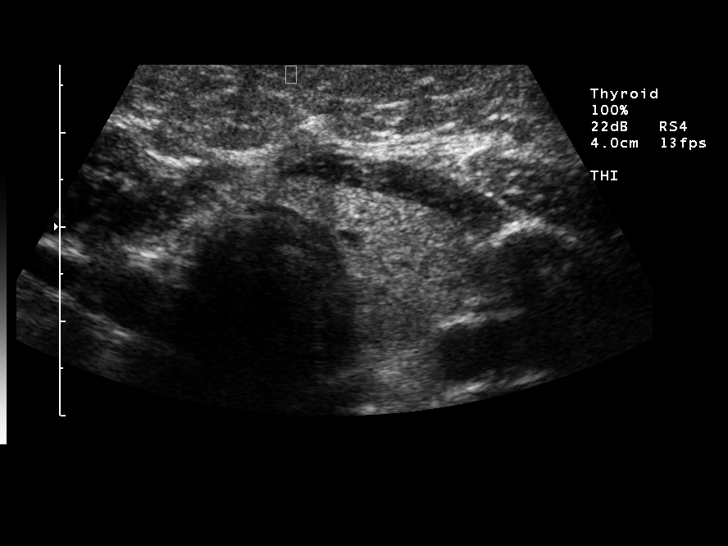

[14 of 25 positions shown; findings below may reference images not displayed]
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
## 2005-09-28 ENCOUNTER — Ambulatory Visit: Payer: Self-pay | Admitting: Family Medicine

## 2005-10-17 ENCOUNTER — Ambulatory Visit: Payer: Self-pay | Admitting: Oncology

## 2005-10-18 LAB — CBC WITH DIFFERENTIAL (CANCER CENTER ONLY)
BASO%: 0.5 % (ref 0.0–2.0)
Eosinophils Absolute: 0.2 10*3/uL (ref 0.0–0.5)
LYMPH#: 2.6 10*3/uL (ref 0.9–3.3)
LYMPH%: 47.8 % (ref 14.0–48.0)
MCV: 92 fL (ref 81–101)
MONO#: 0.3 10*3/uL (ref 0.1–0.9)
NEUT#: 2.4 10*3/uL (ref 1.5–6.5)
Platelets: 233 10*3/uL (ref 145–400)
RBC: 4.57 10*6/uL (ref 3.70–5.32)
RDW: 13.4 % (ref 10.5–14.6)
WBC: 5.5 10*3/uL (ref 3.9–10.0)

## 2005-10-18 LAB — COMPREHENSIVE METABOLIC PANEL
AST: 23 U/L (ref 0–37)
BUN: 11 mg/dL (ref 6–23)
Calcium: 10.1 mg/dL (ref 8.4–10.5)
Chloride: 107 mEq/L (ref 96–112)
Creatinine, Ser: 0.63 mg/dL (ref 0.40–1.20)
Glucose, Bld: 241 mg/dL — ABNORMAL HIGH (ref 70–99)

## 2005-10-18 LAB — CANCER ANTIGEN 27.29: CA 27.29: 16 U/mL (ref 0–39)

## 2005-10-18 LAB — LACTATE DEHYDROGENASE: LDH: 174 U/L (ref 94–250)

## 2005-10-20 ENCOUNTER — Ambulatory Visit (HOSPITAL_COMMUNITY): Admission: RE | Admit: 2005-10-20 | Discharge: 2005-10-20 | Payer: Self-pay | Admitting: Oncology

## 2005-11-01 ENCOUNTER — Emergency Department (HOSPITAL_COMMUNITY): Admission: EM | Admit: 2005-11-01 | Discharge: 2005-11-01 | Payer: Self-pay | Admitting: Emergency Medicine

## 2005-11-13 IMAGING — MR MR CERVICAL SPINE W/O CM
6 series · 48 of 48 positions shown · non-contrast
Comparison: none

CLINICAL DATA: 52 year-old with history of C4-5 fusion with posterior neck and left arm pain.
CERVICAL SPINE MRI WITHOUT CONTRAST.
Multiplanar and multi-sequence imaging was performed. The study is compared with the prior study from [HOSPITAL] from 9559.
The patient has cervical fusion at C4-5 which is correlated with plain films.  There is anterior plate and screw fixation and interbody bone plug with some associated artifact.  I don?t see any complicating features at the operative site.  There is, however, a disk protrusion at C3-4 which may be accounting for the patient?s symptoms.  This is asymmetric to the right.  There is some component of osteophytic ridging.  There is near complete obliteration of the anterior CSF space.  There is mild foraminal encroachment on the right side. No significant signs at C2-3, C5-6, C6-7 or C7-T1.
IMPRESSION 
Cervical fusion at C4-5 without complicating features.  No spinal or foraminal stenosis at this level. 
Broad based disk protrusion and osteophytic ridging at C3-4 with impression on the thecal sac and minimal foraminal encroachment on the right side.
No other significant cervical spine abnormalities.

[Series 2: cervical/loc · axial · 10.0mm · 1.37mm/px · z∈[+10,+174]mm · 5 of 6 slices shown]
[im 1/6]
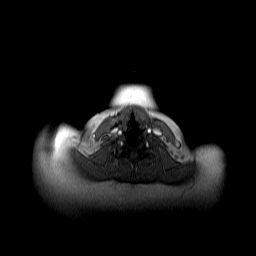
[im 2/6]
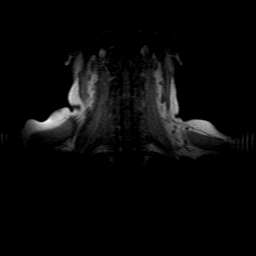
[im 3/6]
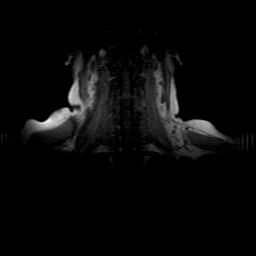
[im 4/6]
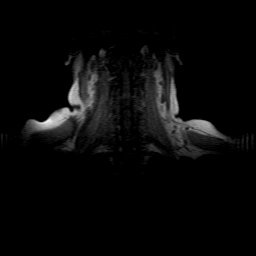
[im 6/6]
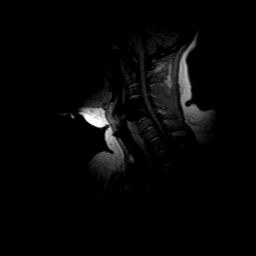

[Series 5: f13: 3:cervical/sag_t1_sw · sagittal · 4.0mm · 1.05mm/px · 7 of 9 slices shown]
[im 1/9]
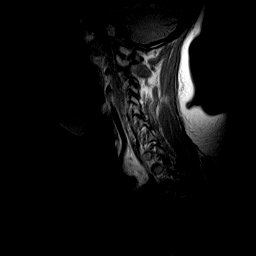
[im 2/9]
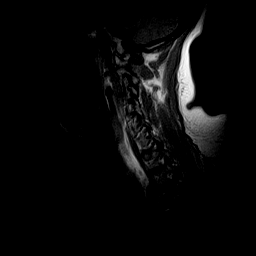
[im 3/9]
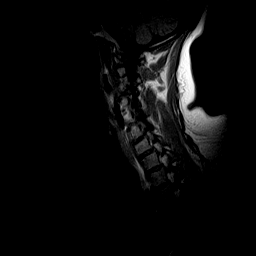
[im 5/9]
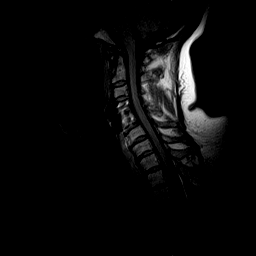
[im 6/9]
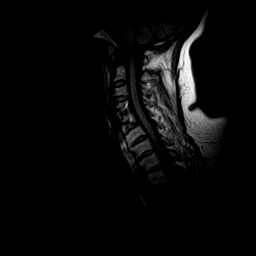
[im 7/9]
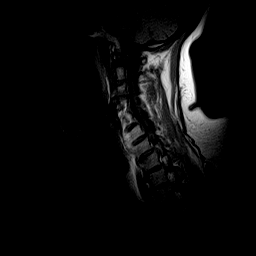
[im 9/9]
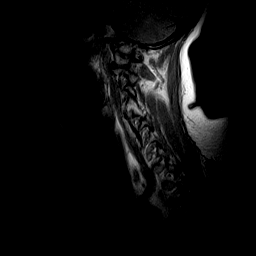

[Series 7: f13: 4:cervical/sag_t2_sw · sagittal · 4.0mm · 1.05mm/px · 8 of 10 slices shown]
[im 1/10]
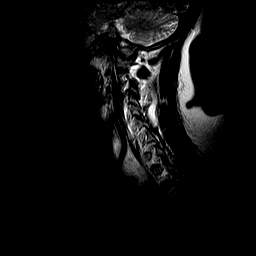
[im 2/10]
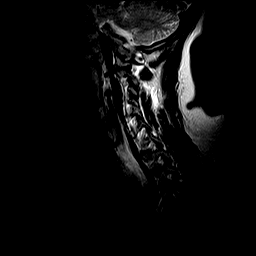
[im 3/10]
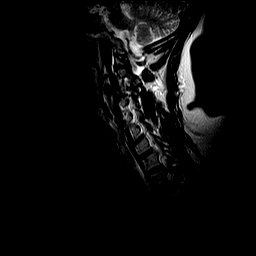
[im 4/10]
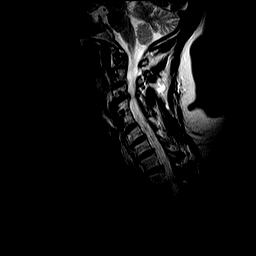
[im 6/10]
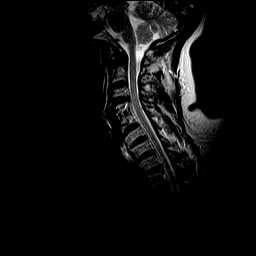
[im 7/10]
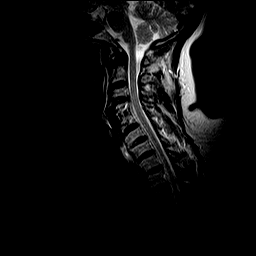
[im 8/10]
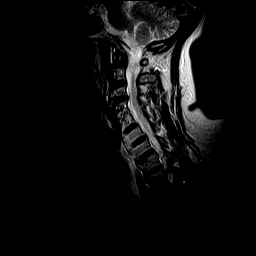
[im 10/10]
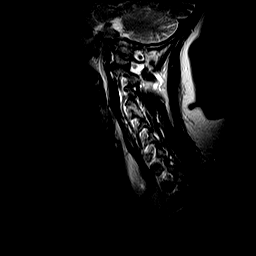

[Series 9: f13: 6:cervical/ciss_sag_ · sagittal · 2.0mm · 0.94mm/px · 11 of 14 slices shown]
[im 1/14]
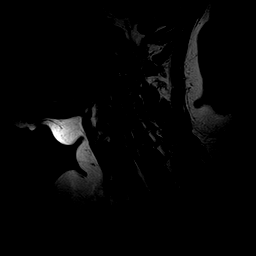
[im 2/14]
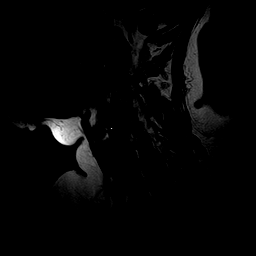
[im 3/14]
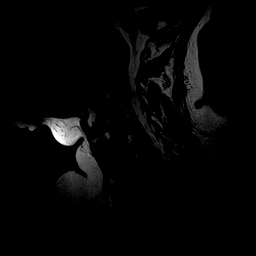
[im 4/14]
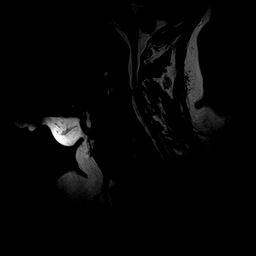
[im 6/14]
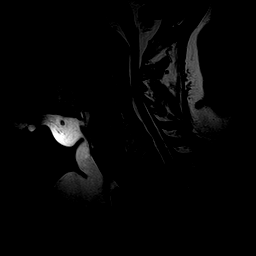
[im 7/14]
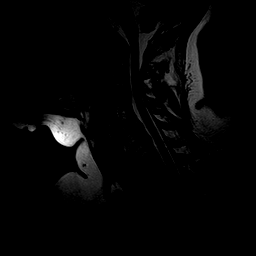
[im 8/14]
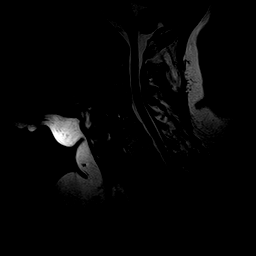
[im 10/14]
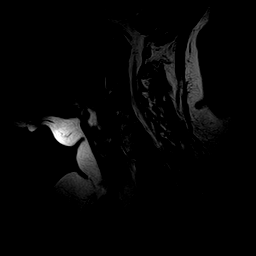
[im 11/14]
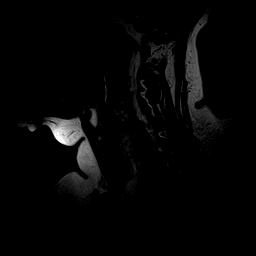
[im 12/14]
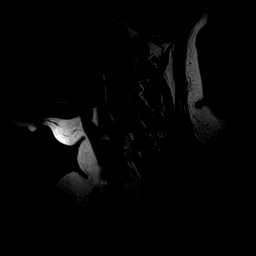
[im 14/14]
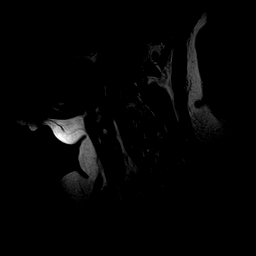

[Series 10: f13: 8:cervical/2d_fl_tra · axial · 3.0mm · 1.05mm/px · z∈[-89,+5]mm · 14 of 17 slices shown]
[im 1/17]
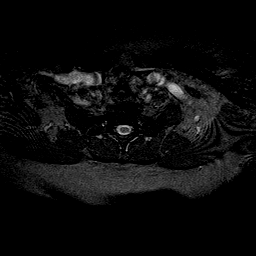
[im 2/17]
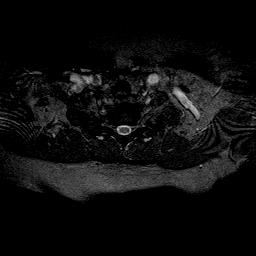
[im 3/17]
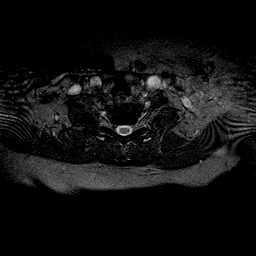
[im 4/17]
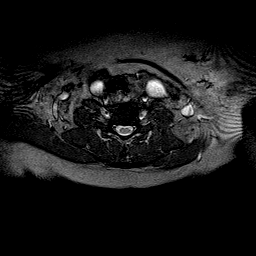
[im 5/17]
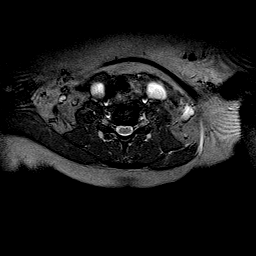
[im 7/17]
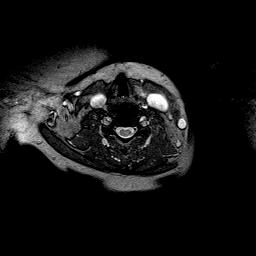
[im 8/17]
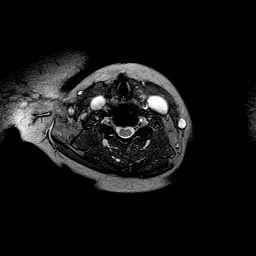
[im 9/17]
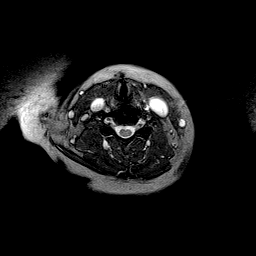
[im 10/17]
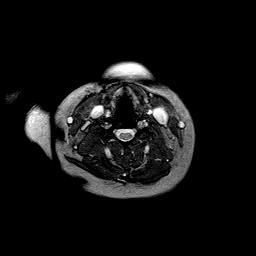
[im 12/17]
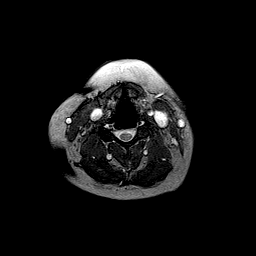
[im 13/17]
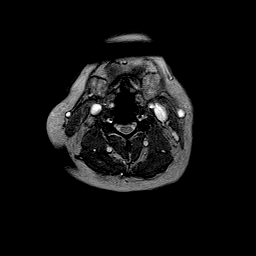
[im 14/17]
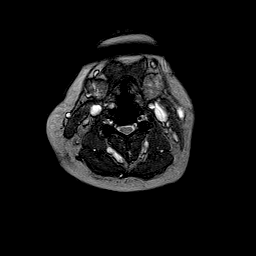
[im 15/17]
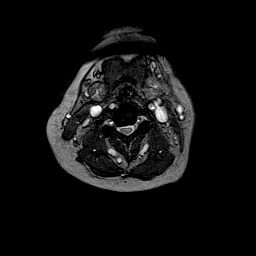
[im 17/17]
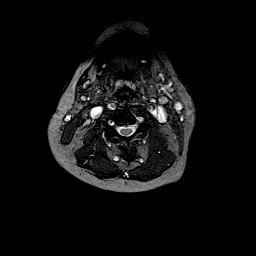

[Series 11: slice display · sagittal · 4.0mm · 1.05mm/px · 3 of 4 slices shown]
[im 1/4]
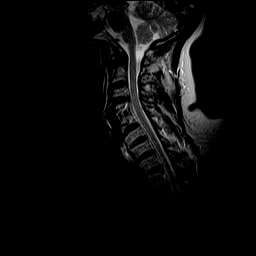
[im 2/4]
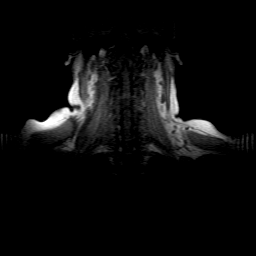
[im 4/4]
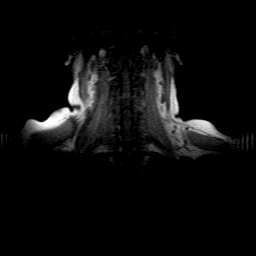

[48 of 48 positions shown; findings below may reference images not displayed]

## 2005-11-20 IMAGING — US US BIOPSY
1 series · 13 of 15 positions shown · non-contrast
Comparison: none

CLINICAL DATA: Patient with a history of prior right thyroidectomy in the 70?s secondary to benign disease as well as history of squamous cell CA of unknown primary diagnosed via axillary lymph node biopsy in 0882.  Thyroid ultrasound performed at [HOSPITAL] on 05/23/2003 revealed a 1.8 x 1.3 x 1.0 cm solid nodule in the upper pole of the left thyroid lobe.   Request is made for fine needle aspiration of this solitary left upper pole thyroid nodule.  
ULTRASOUND GUIDED FINE-NEEDLE ASPIRATION OF LEFT UPPER POLE THYROID NODULE

[Series 1: unknown · 0.09mm/px · 13 of 15 slices shown]
[im 1/15]
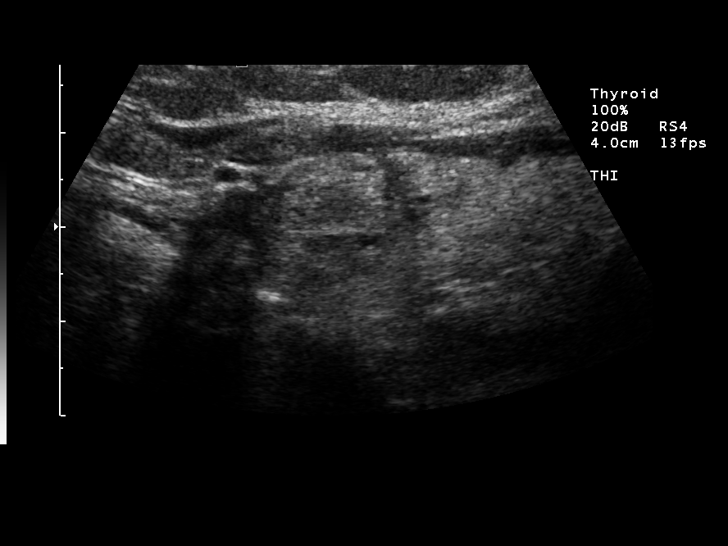
[im 2/15]
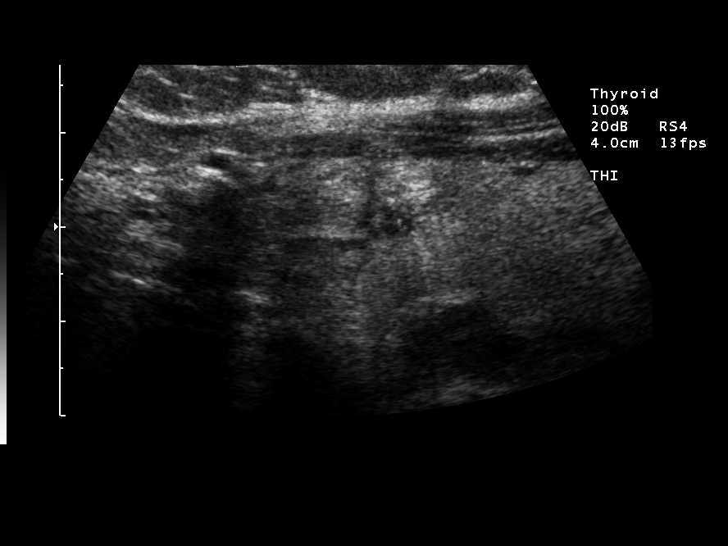
[im 3/15]
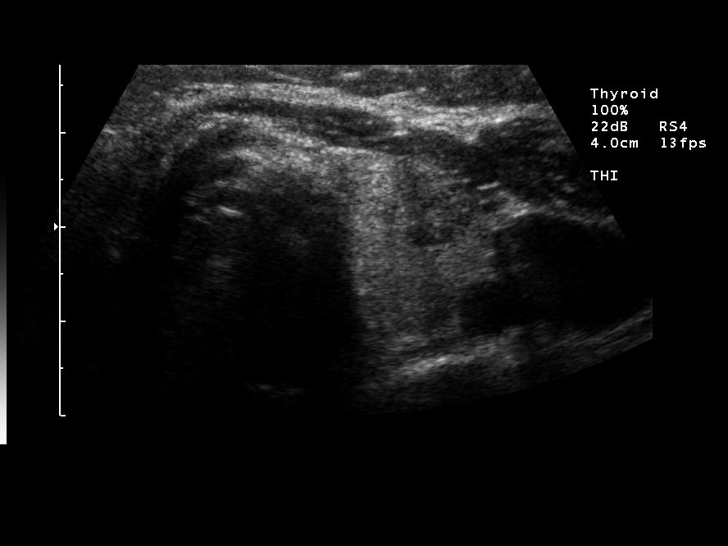
[im 5/15]
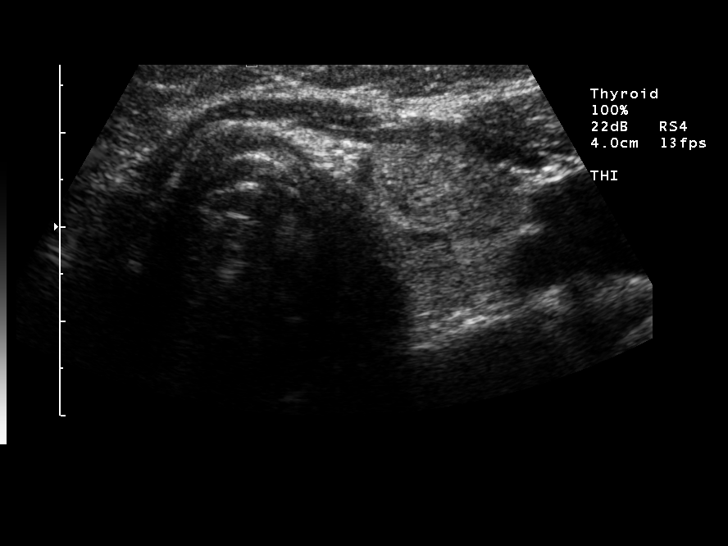
[im 6/15]
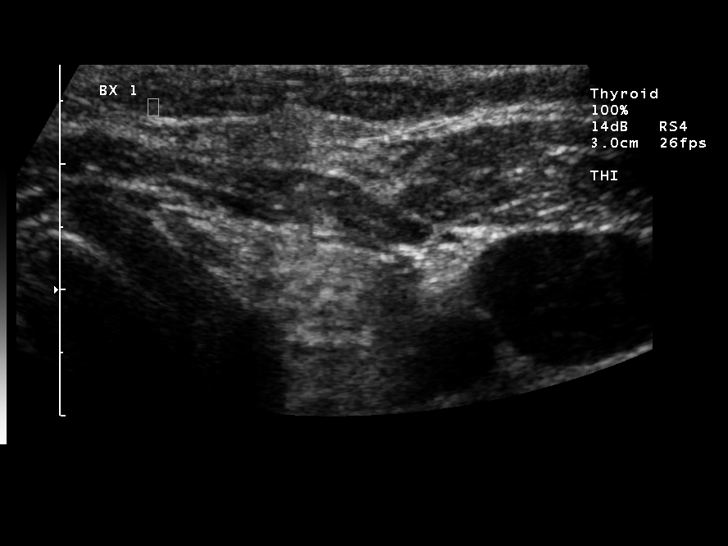
[im 7/15]
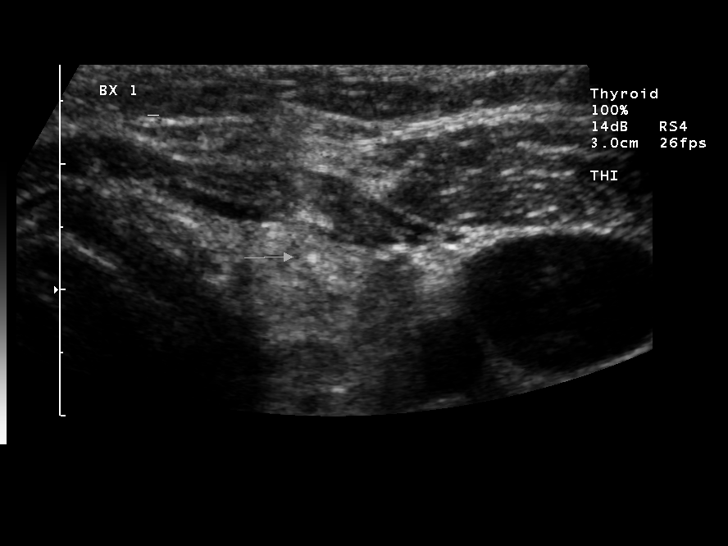
[im 8/15]
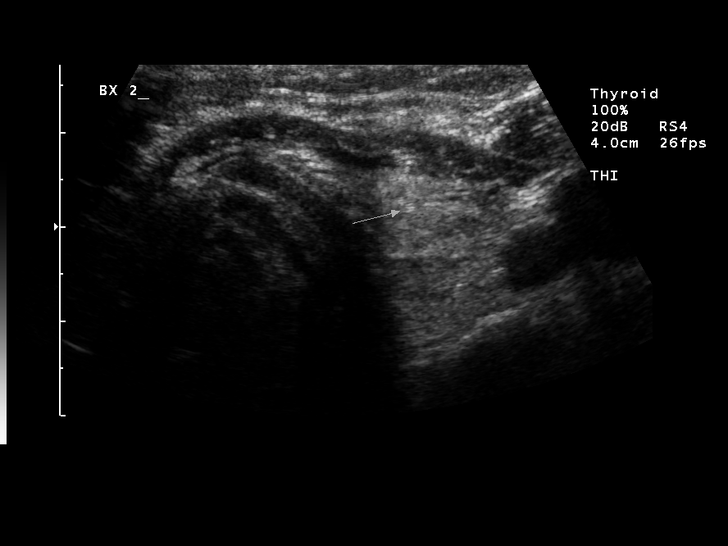
[im 9/15]
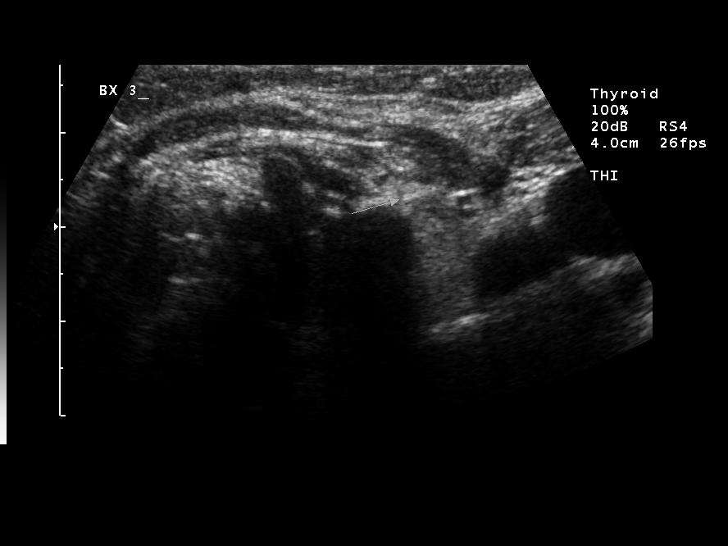
[im 10/15]
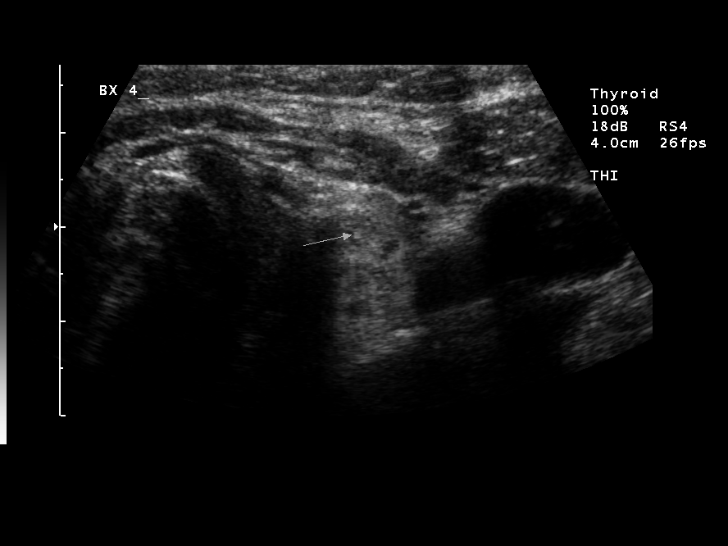
[im 11/15]
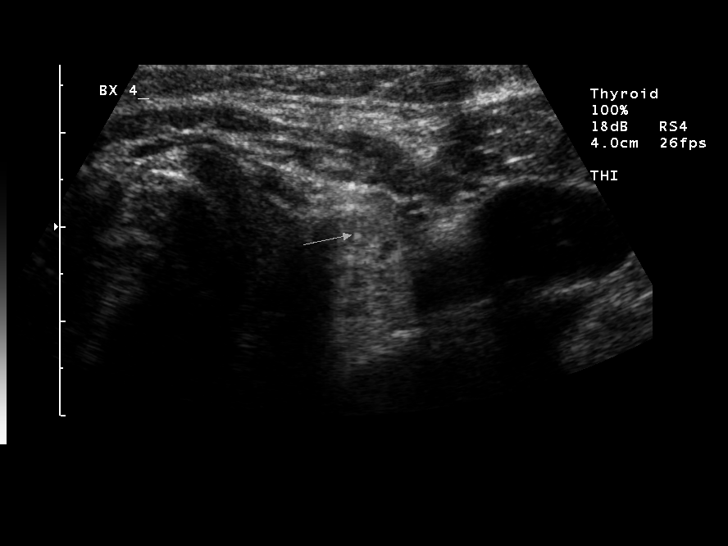
[im 13/15]
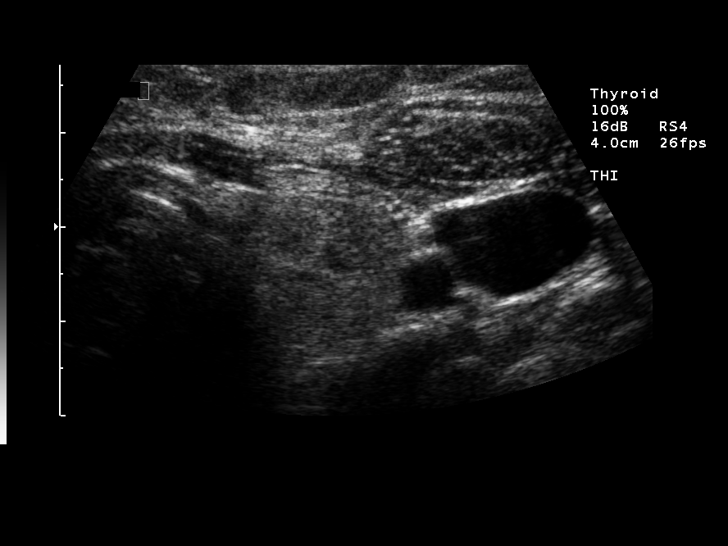
[im 14/15]
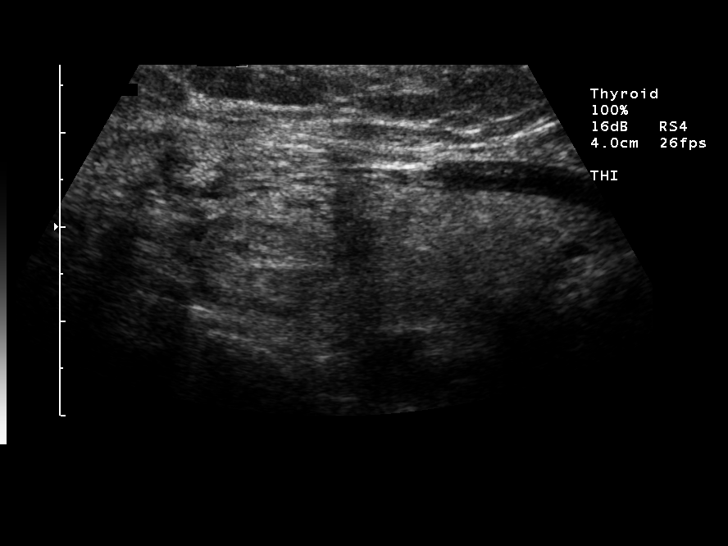
[im 15/15]
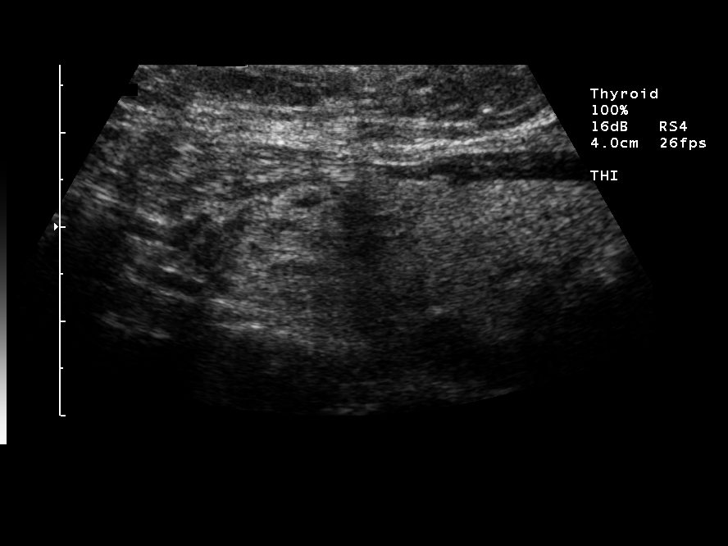

[13 of 15 positions shown; findings below may reference images not displayed]

FINDINGS: An ultrasound-guided thyroid biopsy was thoroughly discussed with the patient and questions were answered.  Risks and benefits of the procedure were also delineated.   Risks specifically discussed included bleeding, bruising, possibility of nondiagnostic aspirate, infection, and risk of injury to adjacent blood vessels and nerves. The patient understands and wishes to proceed.   Verbal and written consent was obtained.  
After the patient was prepped and draped in the normal sterile fashion, 1% lidocaine was used for local anesthesia.  Under direct ultrasound guidance, four passes were then made using 25 gauge hypodermic needles into the solitary left upper pole thyroid lobe nodule.  Ultrasound imaging confirmed appropriate needle placement in the nodule.  The specimens were given to cytology for further analysis.  The patient tolerated the procedure well and there were no immediate complications.  No hematoma was identified post procedure.  The procedure was performed under the direct supervision of Dr. Siqi Michalski. 
IMPRESSION
Successful ultrasound-guided fine needle aspiration of solitary left upper pole thyroid lobe nodule as discussed above.

## 2005-12-01 ENCOUNTER — Ambulatory Visit: Payer: Self-pay | Admitting: Family Medicine

## 2005-12-26 ENCOUNTER — Ambulatory Visit: Payer: Self-pay | Admitting: Family Medicine

## 2005-12-26 IMAGING — NM NM BONE WHOLE BODY
6 series · 6 of 6 positions shown · non-contrast
Comparison: none

CLINICAL DATA: Staging - unknown primary.  Upper rib pain. 
 BONE SCAN
 The patient was injected with 24.1 mCi of 6c88m MDP intravenously, and a total body bone scan was performed.  The activity throughout the bony skeleton is normal.  The kidneys excrete the radionuclide.  Mild photopenia is noted in the right knee secondary to right total knee replacement.  Minimal activity is noted in the left knee most likely secondary to degenerative change. 
 IMPRESSION
 No bone metastasis.  Probable degenerative change in the left knee.

[wb whole body · 2.33mm/px · 1 of 1 slices shown (1 of 6)]
[im 1/1]
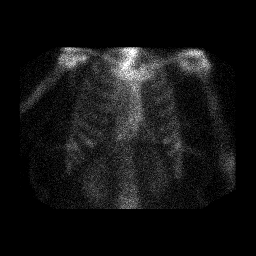

[wb whole body · 2.36mm/px · 1 of 1 slices shown (2 of 6)]
[im 1/1  full-range]
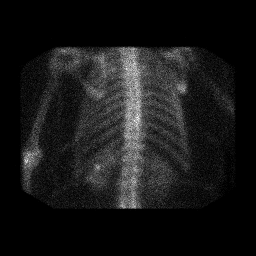

[wb whole body · 2.33mm/px · 1 of 1 slices shown (3 of 6)]
[im 1/1]
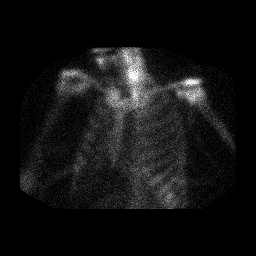

[wb whole body · 1.18mm/px · 1 of 1 slices shown (4 of 6)]
[im 1/1]
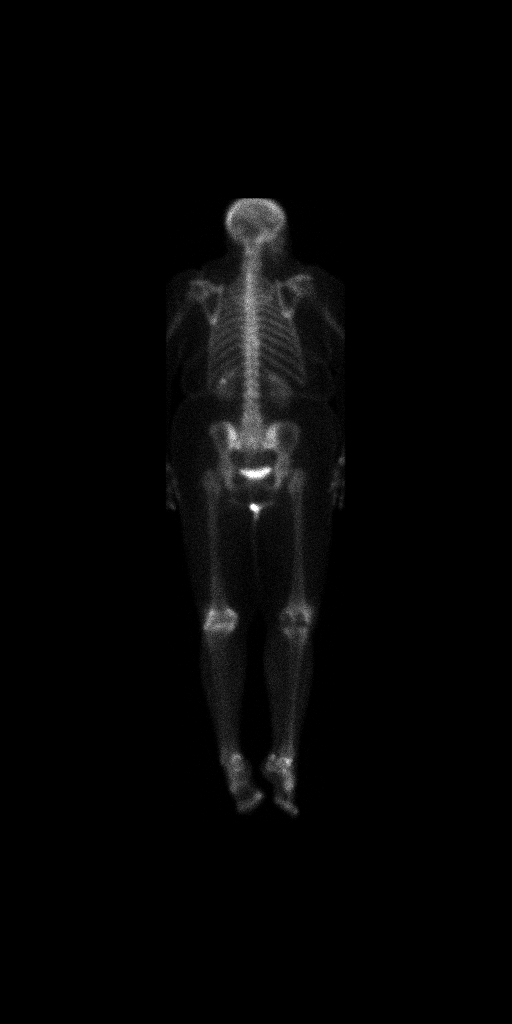

[wb whole body · 2.36mm/px · 1 of 1 slices shown (5 of 6)]
[im 1/1  full-range]
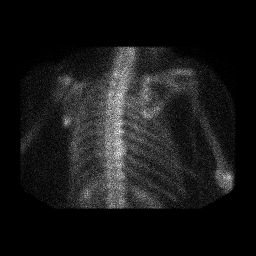

[wb whole body · 1.17mm/px · 1 of 1 slices shown (6 of 6)]
[im 1/1]
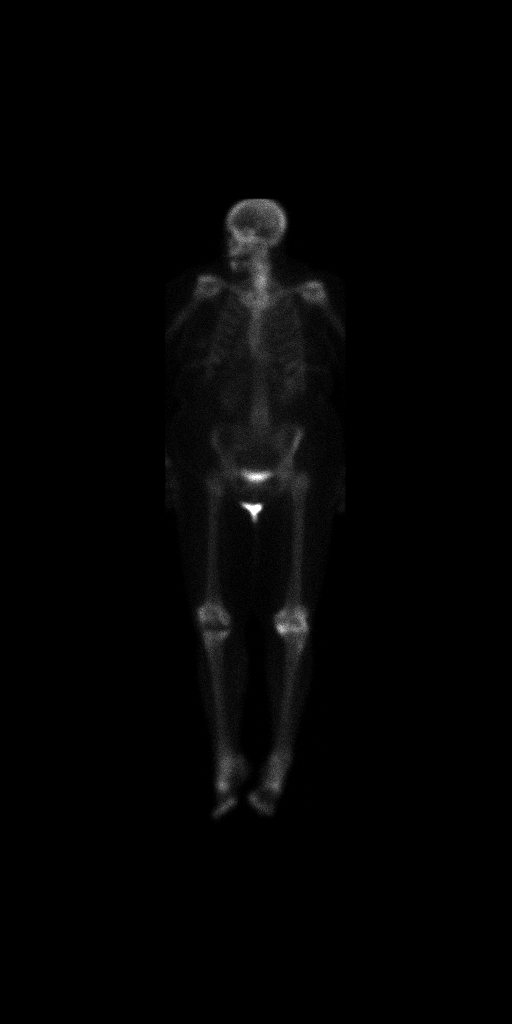

[6 of 6 positions shown; findings below may reference images not displayed]

## 2006-01-05 ENCOUNTER — Ambulatory Visit: Payer: Self-pay | Admitting: Cardiovascular Disease

## 2006-01-17 ENCOUNTER — Encounter: Admission: RE | Admit: 2006-01-17 | Discharge: 2006-01-17 | Payer: Self-pay | Admitting: Orthopedic Surgery

## 2006-02-06 ENCOUNTER — Encounter: Admission: RE | Admit: 2006-02-06 | Discharge: 2006-02-06 | Payer: Self-pay | Admitting: Orthopedic Surgery

## 2006-02-16 ENCOUNTER — Ambulatory Visit: Payer: Self-pay | Admitting: Cardiovascular Disease

## 2006-02-17 ENCOUNTER — Ambulatory Visit: Payer: Self-pay | Admitting: Family Medicine

## 2006-02-22 ENCOUNTER — Ambulatory Visit: Payer: Self-pay | Admitting: Family Medicine

## 2006-02-22 ENCOUNTER — Ambulatory Visit: Payer: Self-pay

## 2006-02-22 ENCOUNTER — Encounter: Payer: Self-pay | Admitting: Cardiology

## 2006-03-24 ENCOUNTER — Ambulatory Visit: Payer: Self-pay | Admitting: Family Medicine

## 2006-04-14 ENCOUNTER — Ambulatory Visit: Payer: Self-pay | Admitting: Oncology

## 2006-04-14 IMAGING — MR MR BREAST*R* WO/W CM
9 of 18 series · 19 of 48 positions shown · IV contrast (20cc Omniscan)
Comparison: none

FINDINGS

MR BREAST BILATERAL WITH/WITHOUT CONTRAST
HISTORY: History of right axillary lymph node biopsy in 2002 showing squamous cell carcinoma.  The
patient underwent chemotherapy at that time.  The patient presents most recently with a right
breast mass, positive for malignancy on core biopsy.

[Series 2: T2 · axial · 3.0mm · 0.78mm/px · 1 of 40 slices shown]
[im 1/40]
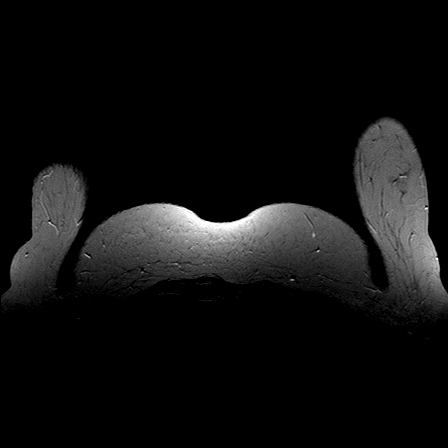

[Series 3: fl3d dynamic pre · axial · non-contrast · 1.8mm · 0.68mm/px · z∈[-77,+79]mm · 3 of 88 slices shown]
[im 1/88]
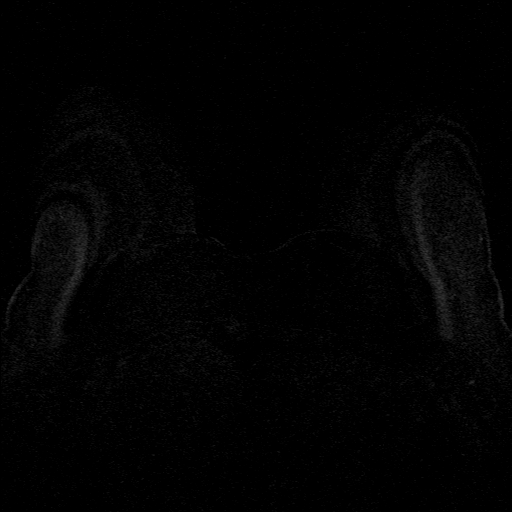
[im 44/88]
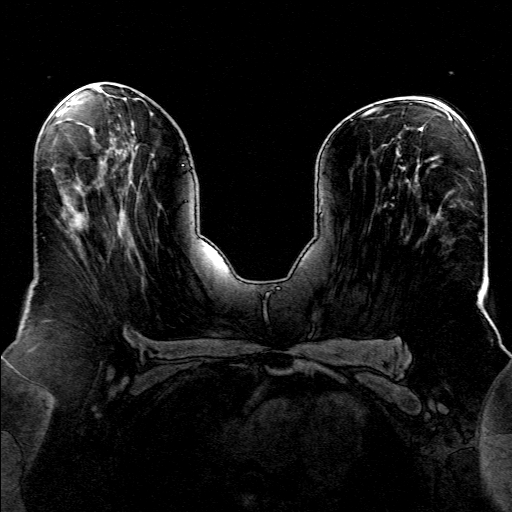
[im 88/88]
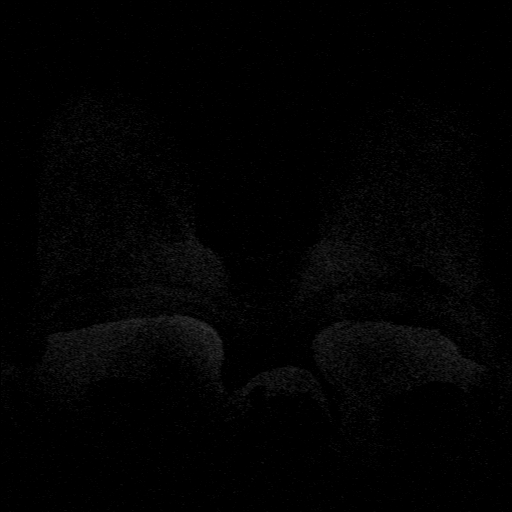

[Series 4: fl3d dynamic (id) · axial · 1.8mm · 0.68mm/px · z∈[-77,+79]mm · 3 of 88 slices shown (1 of 3)]
[im 1/88]
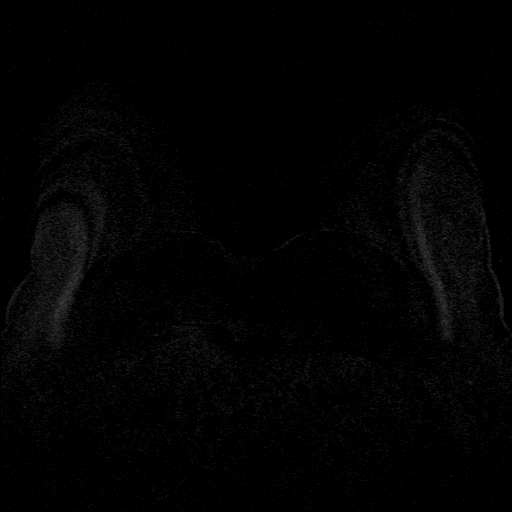
[im 44/88]
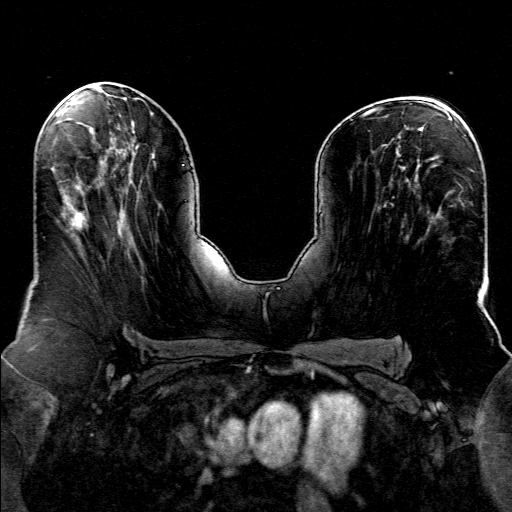
[im 88/88]
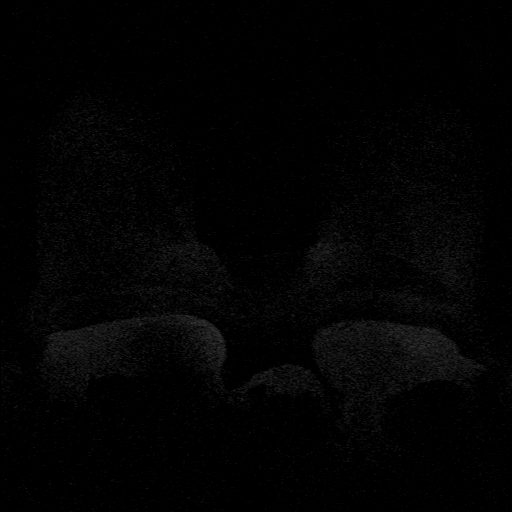

[Series 5: fl3d dynamic (id)_sub · axial · 1.8mm · 0.68mm/px · z∈[-77,+79]mm · 3 of 88 slices shown (1 of 2)]
[im 1/88]
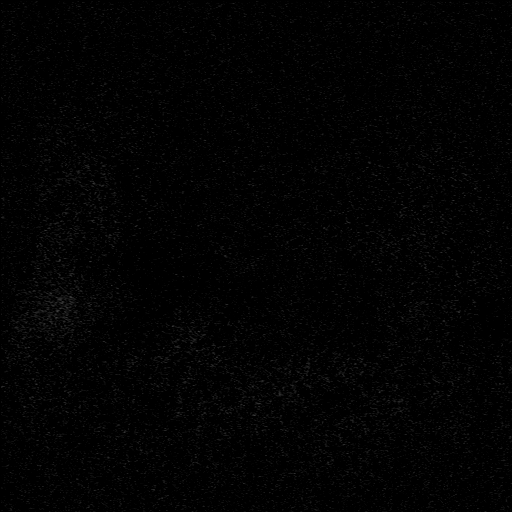
[im 44/88]
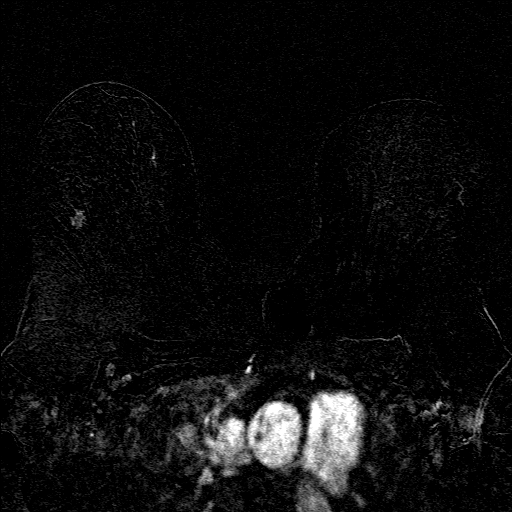
[im 88/88]
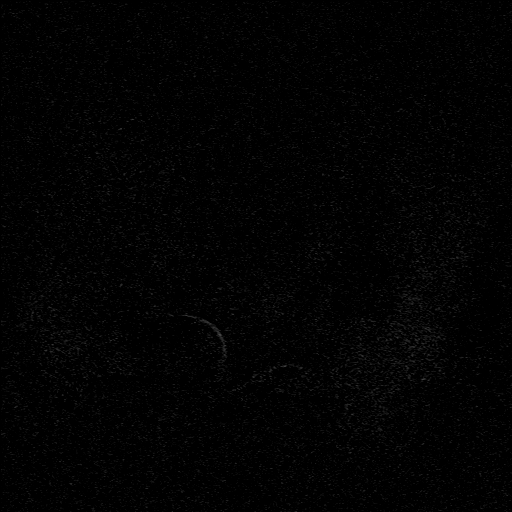

[Series 6: fl3d dynamic (id)_sub_mip_tra · axial · 158.4mm · 0.68mm/px · 1 of 1 slices shown (1 of 2)]
[im 1/1]
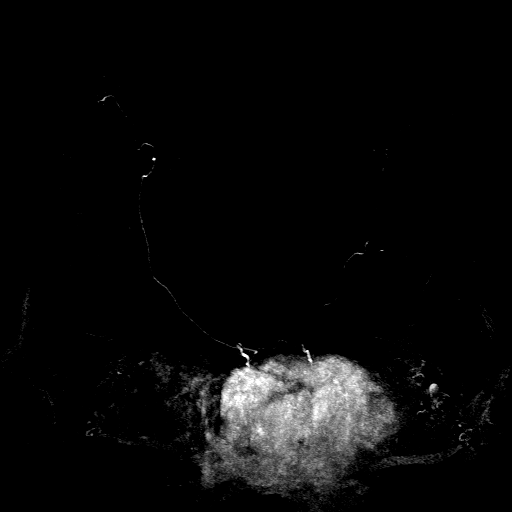

[Series 7: fl3d dynamic (id) · axial · 1.8mm · 0.68mm/px · z∈[-77,+79]mm · 3 of 88 slices shown (2 of 3)]
[im 1/88]
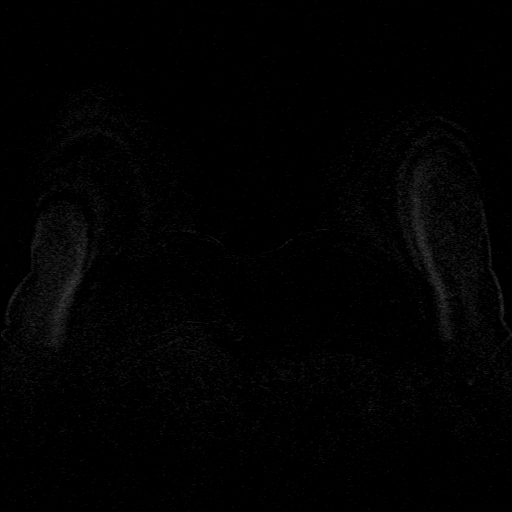
[im 44/88]
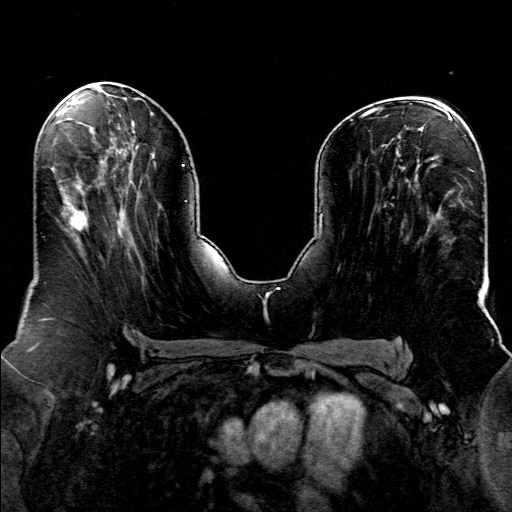
[im 88/88]
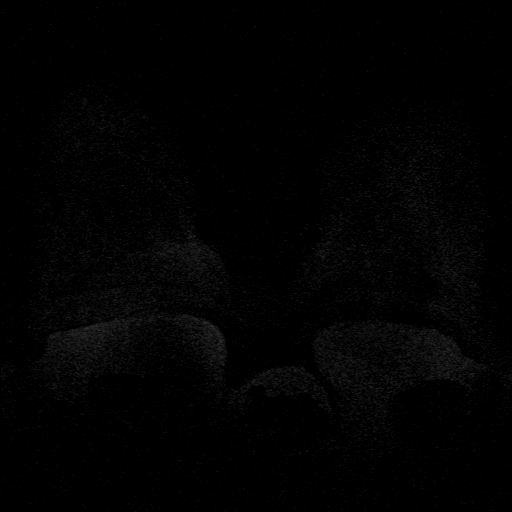

[Series 8: fl3d dynamic (id)_sub · axial · 1.8mm · 0.68mm/px · z∈[-77,+79]mm · 3 of 88 slices shown (2 of 2)]
[im 1/88]
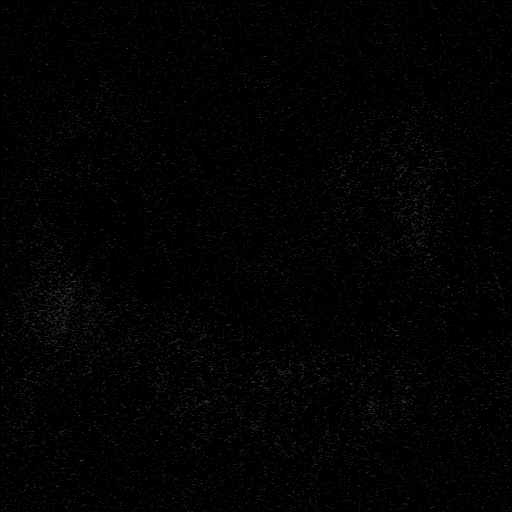
[im 44/88]
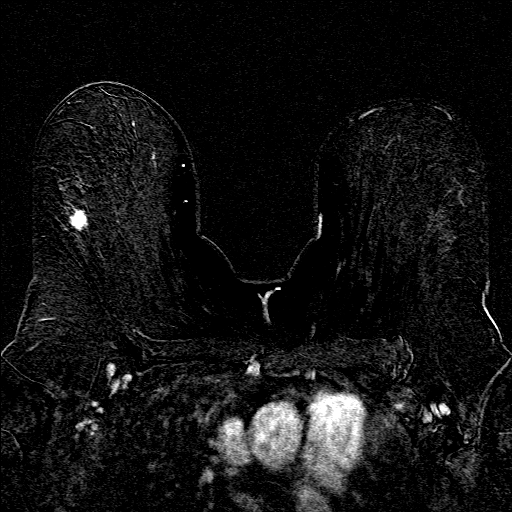
[im 88/88]
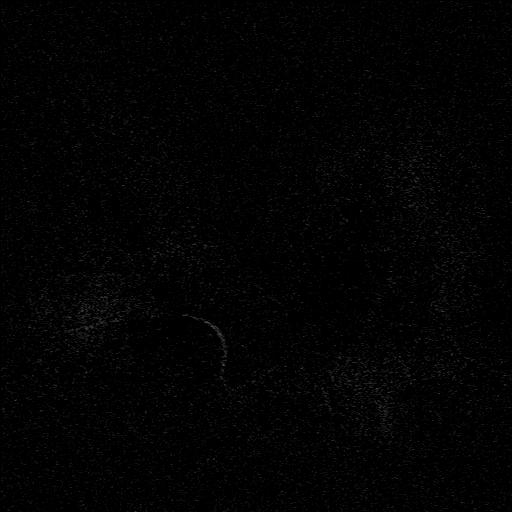

[Series 9: fl3d dynamic (id)_sub_mip_tra · axial · 158.4mm · 0.68mm/px · 1 of 1 slices shown (2 of 2)]
[im 1/1]
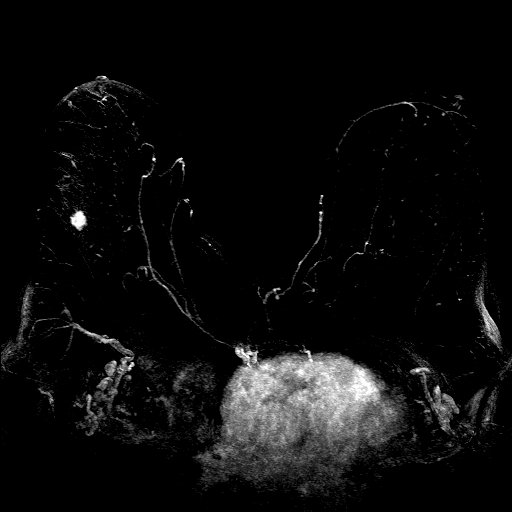

[Series 10: fl3d dynamic (id) · axial · 1.8mm · 0.68mm/px · 1 of 88 slices shown (3 of 3)]
[im 1/88]
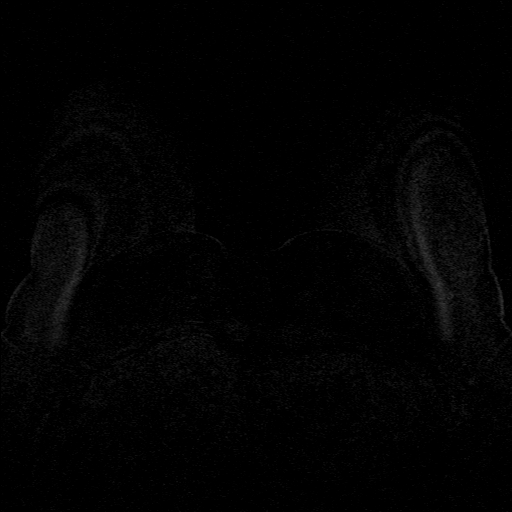

[19 of 48 positions shown; findings below may reference images not displayed]

Axial images are acquired through both breasts prior to and following the administration of 20 cc
of Omniscan. Comparison with recent mammogram from [HOSPITAL] and ultrasound from The
[facility]  Within the upper outer quadrant of the right breast, there is a small
spiculated mass, correlating well with the mammographic appearance. The margins of this lesion are
irregular and there is enhancement following the adminstration of gadolinium.  No other lesions are
seen within either breast to suggest other sites of malignancy.

IMPRESSION

Solitary area of concern within the upper outer quadrant of the right breast.  This is consistent
with malignancy as shown on prior core biopsy.

ASSESSMENT: Known biopsy proven malignancy - BI-RADS 6

Treatment plan.

## 2006-04-17 LAB — COMPREHENSIVE METABOLIC PANEL
ALT: 17 U/L (ref 0–35)
AST: 15 U/L (ref 0–37)
Albumin: 4.3 g/dL (ref 3.5–5.2)
Alkaline Phosphatase: 61 U/L (ref 39–117)
Glucose, Bld: 108 mg/dL — ABNORMAL HIGH (ref 70–99)
Potassium: 4 mEq/L (ref 3.5–5.3)
Sodium: 142 mEq/L (ref 135–145)
Total Bilirubin: 0.5 mg/dL (ref 0.3–1.2)
Total Protein: 7.7 g/dL (ref 6.0–8.3)

## 2006-04-17 LAB — CBC WITH DIFFERENTIAL (CANCER CENTER ONLY)
BASO#: 0 10*3/uL (ref 0.0–0.2)
Eosinophils Absolute: 0.2 10*3/uL (ref 0.0–0.5)
HCT: 41.6 % (ref 34.8–46.6)
HGB: 13.6 g/dL (ref 11.6–15.9)
LYMPH#: 2.4 10*3/uL (ref 0.9–3.3)
LYMPH%: 37.3 % (ref 14.0–48.0)
MCV: 91 fL (ref 81–101)
MONO#: 0.4 10*3/uL (ref 0.1–0.9)
NEUT%: 53.2 % (ref 39.6–80.0)
RBC: 4.55 10*6/uL (ref 3.70–5.32)
WBC: 6.4 10*3/uL (ref 3.9–10.0)

## 2006-04-19 ENCOUNTER — Ambulatory Visit (HOSPITAL_COMMUNITY): Admission: RE | Admit: 2006-04-19 | Discharge: 2006-04-19 | Payer: Self-pay | Admitting: Oncology

## 2006-05-02 ENCOUNTER — Ambulatory Visit: Payer: Self-pay | Admitting: Family Medicine

## 2006-05-03 ENCOUNTER — Encounter: Admission: RE | Admit: 2006-05-03 | Discharge: 2006-05-03 | Payer: Self-pay | Admitting: Family Medicine

## 2006-05-17 ENCOUNTER — Ambulatory Visit: Payer: Self-pay | Admitting: Cardiovascular Disease

## 2006-07-16 IMAGING — CT CT CHEST W/O CM
1 of 3 series · 14 of 29 positions shown, 18 images · non-contrast
Comparison: 05/19/03.

CLINICAL DATA: Squamous cell carcinoma of unknown primary diagnosed in Tuesday October, 2001.  Restaging prior to chemotherapy course.  History of breast cancer. 
Multidetector CT imaging of the chest, abdomen and pelvis was performed following oral contrast in this patient who has no intravenous access.

[Series 5: — · axial · 0.75mm/px · z∈[-511,-61]mm · 14 of 104 slices shown, 18 images]
[im 7/104  mediastinal]
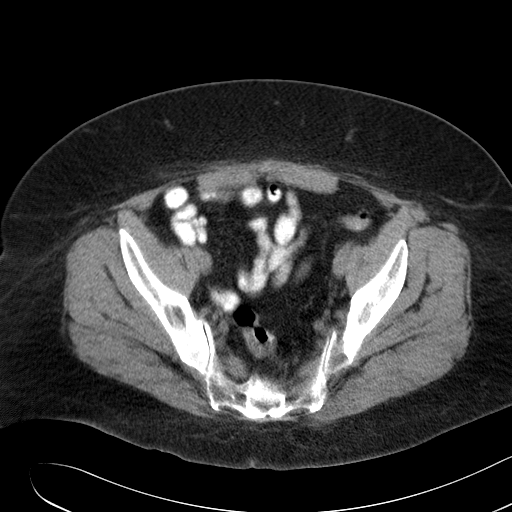
[im 7/104  lung]
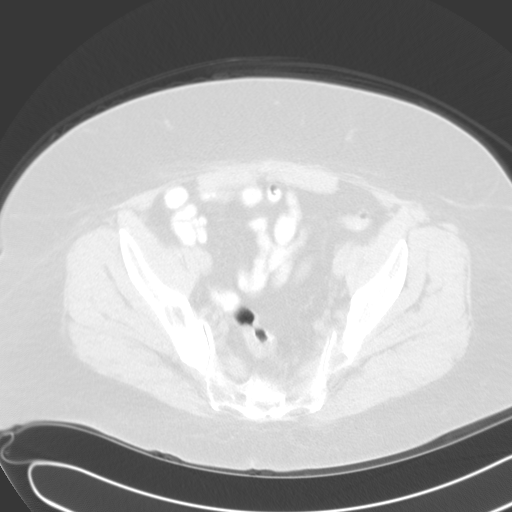
[im 13/104  lung]
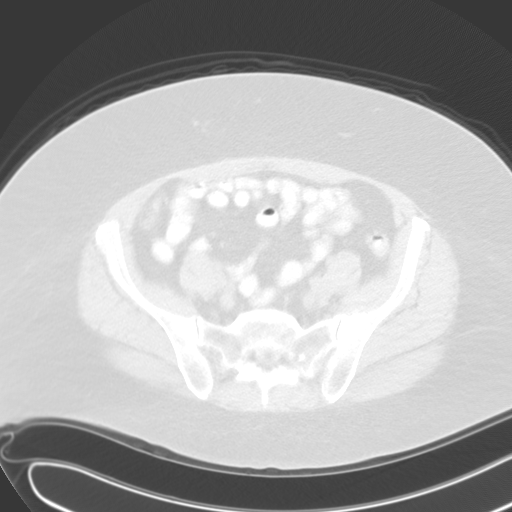
[im 20/104  lung]
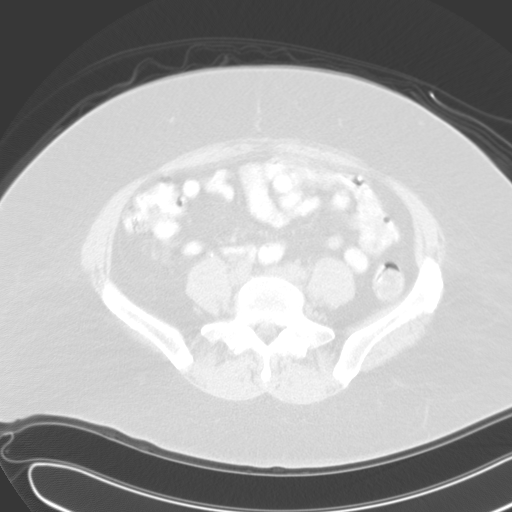
[im 33/104  lung]
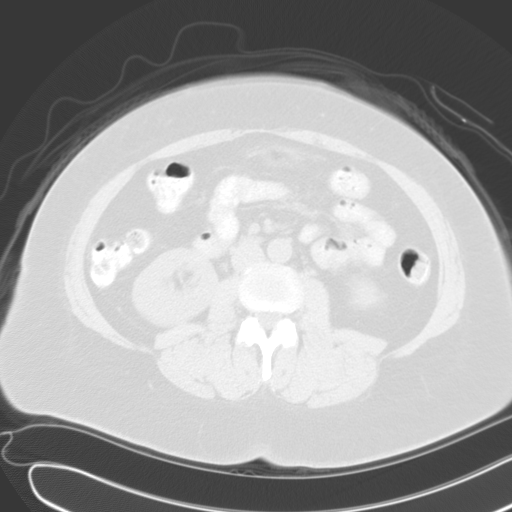
[im 39/104  mediastinal]
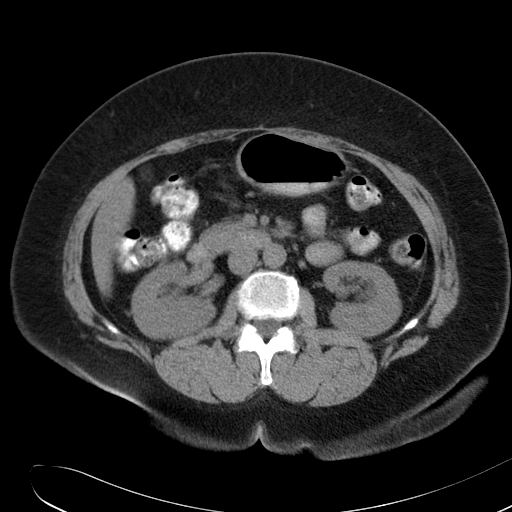
[im 39/104  lung]
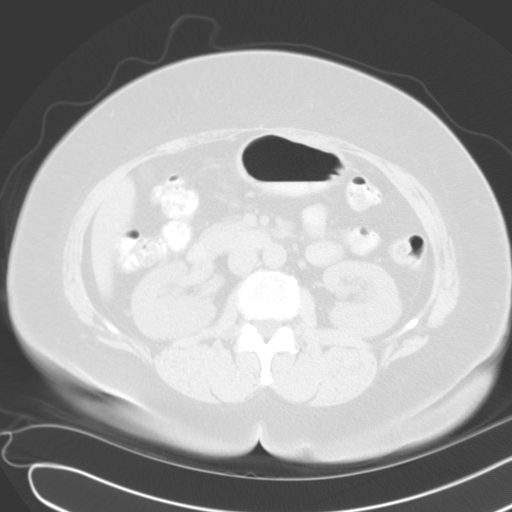
[im 42/104  lung]
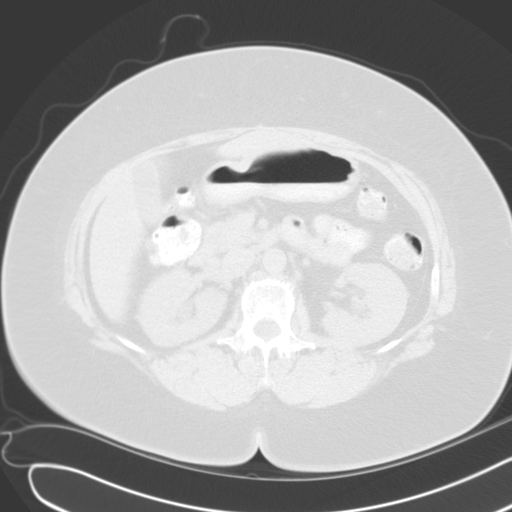
[im 46/104  lung]
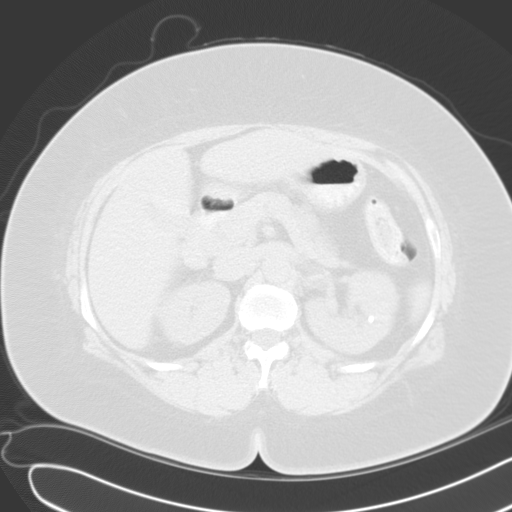
[im 52/104  lung]
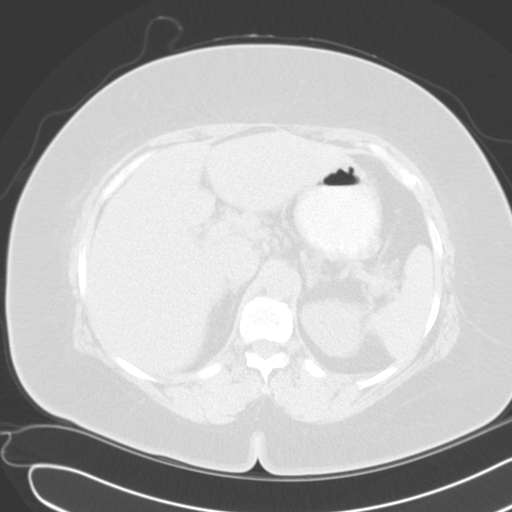
[im 58/104  mediastinal]
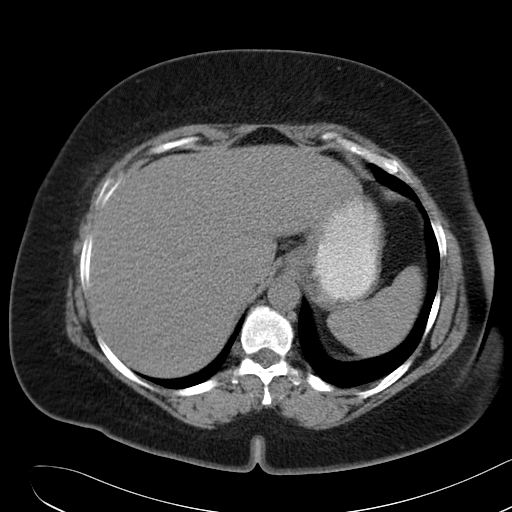
[im 58/104  lung]
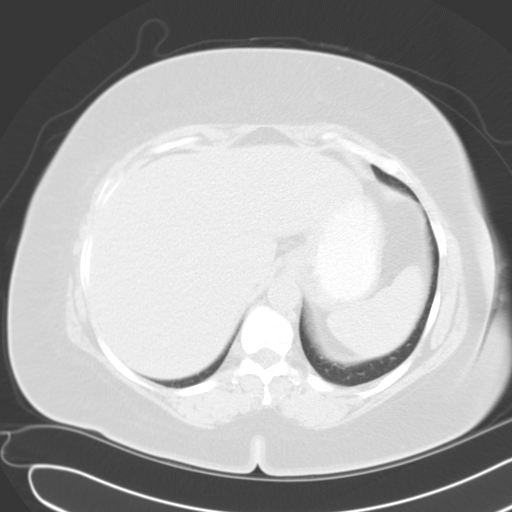
[im 65/104  lung]
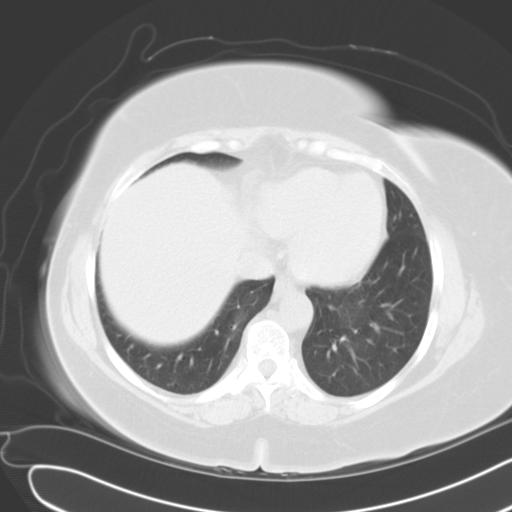
[im 78/104  lung]
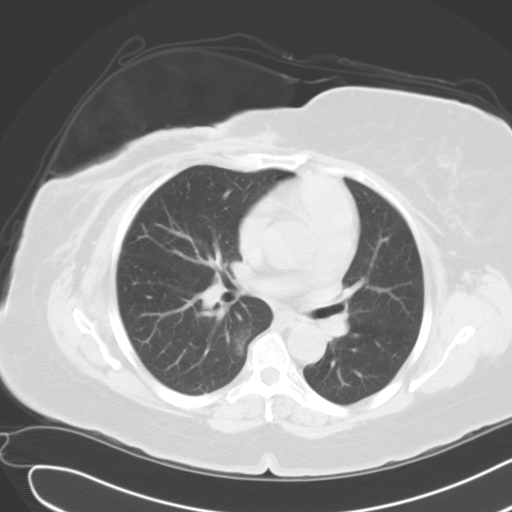
[im 84/104  lung]
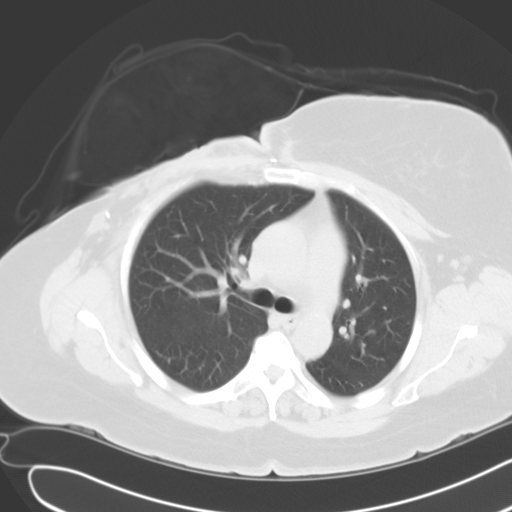
[im 91/104  mediastinal]
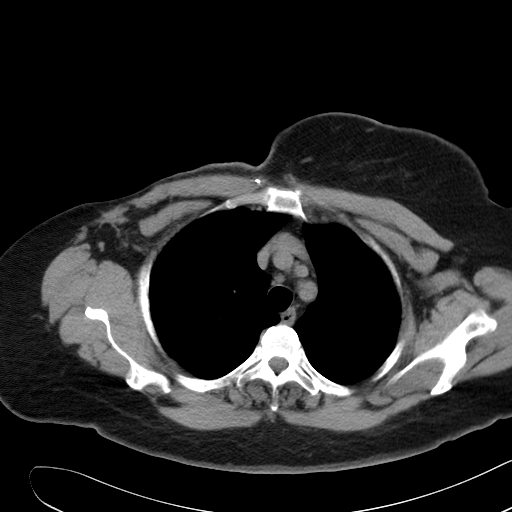
[im 91/104  lung]
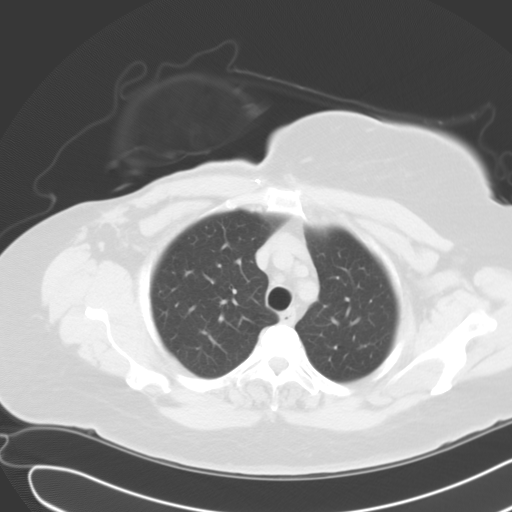
[im 97/104  lung]
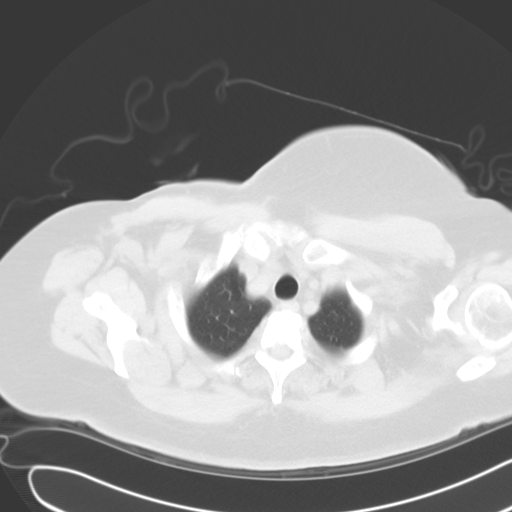

[14 of 29 positions shown; findings below may reference images not displayed]

CT OF CHEST WITHOUT CONTRAST:
The mediastinum has a stable appearance without evidence of adenopathy.  The patient has undergone interval right mastectomy and right axillary node dissection.  Postoperative changes are noted in the right axilla without evidence of adenopathy or suspicious fluid collection.  Small lymph nodes in the left axilla are normal by size criteria.  There is no internal mammary adenopathy.  Previously described small lesion in the thyroid gland appears grossly stable in this unenhanced study.  The lungs are clear.  There is no pleural or pericardial effusion.
IMPRESSION: Interval right mastectomy without apparent complication.  No evidence of metastatic disease. 
CT OF ABDOMEN WITHOUT CONTRAST:
Nonobstructing calculi are present in the upper pole of the right kidney and in the mid and lower pole of the left kidney.  There is no hydronephrosis or perinephric inflammatory change.  The kidneys, adrenal glands, liver, spleen, pancreas, and gallbladder otherwise appear unremarkable as imaged in the unenhanced state.  No enlarged lymph nodes are demonstrated.
IMPRESSION: 1.  No evidence of intraabdominal metastatic disease.  Sensitivity is mildly limited by the lack of IV contrast. 
2.  Bilateral nonobstructing renal calculi. 
CT OF PELVIS WITHOUT CONTRAST:
The uterus is surgically absent.  There are bilateral pelvic phleboliths as well as one in the right ovarian vein anterior to the right psoas muscle on image 85.  No ureteral calculi are demonstrated.  There is no pelvic mass, fluid collection, or inflammatory process.
IMPRESSION: 1.  Stable CT of the pelvis.  No evidence of metastatic disease. 
2.  Bone windows for the entire study were reviewed and demonstrate no suspicious lesions.

## 2006-07-17 IMAGING — PT NM PET TUM IMG SKULL BASE T - THIGH
3 series · 25 of 25 positions shown · non-contrast
Comparison: No prior PET-CT. Unenhanced CT chest abdomen and pelvis performed
yesterday is correlated.

CLINICAL DATA: History of right breast cancer, status post mastectomy November 2003. Restaging.

FDG PET-CT TUMOR IMAGING (SKULL BASE TO THIGHS)  03/25/2004:
Fasting Blood Glucose:  174
TECHNIQUE: 14.4 mCi F-18 FDG was injected intravenously via the right hand vein
.  Full-ring PET imaging was performed from the skull base through the
mid-thighs 63 minutes after injection.  CT data was obtained and used for
attenuation correction and anatomic localization only.  (This was not acquired
as a diagnostic CT examination.)

[Series 1: pet ac · axial · 3.3mm · 4.69mm/px · z∈[-890,-20]mm · 9 of 267 slices shown]
[im 1/267]
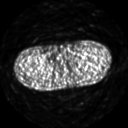
[im 34/267]
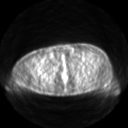
[im 67/267]
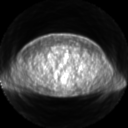
[im 100/267]
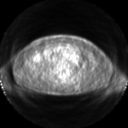
[im 134/267]
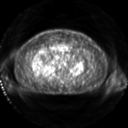
[im 167/267]
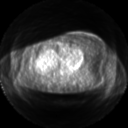
[im 200/267]
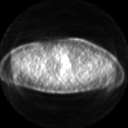
[im 233/267]
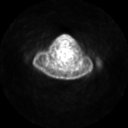
[im 267/267]
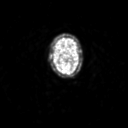

[Series 2: pet nac · axial · 3.3mm · 4.69mm/px · z∈[-890,-20]mm · 8 of 267 slices shown]
[im 1/267]
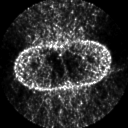
[im 39/267]
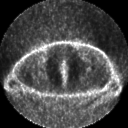
[im 77/267]
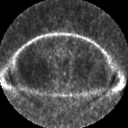
[im 115/267]
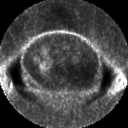
[im 153/267]
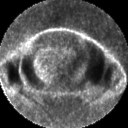
[im 191/267]
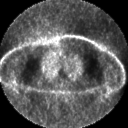
[im 229/267]
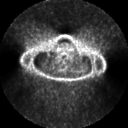
[im 267/267]
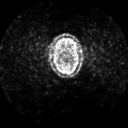

[Series 2: ct images · axial · 3.8mm · 0.98mm/px · z∈[-890,-20]mm · 8 of 267 slices shown]
[im 1/267]
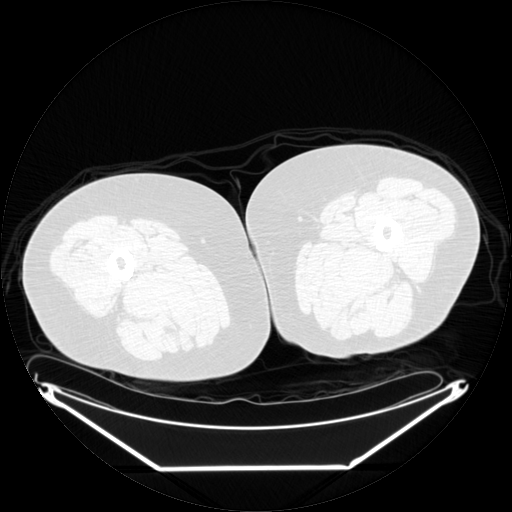
[im 39/267]
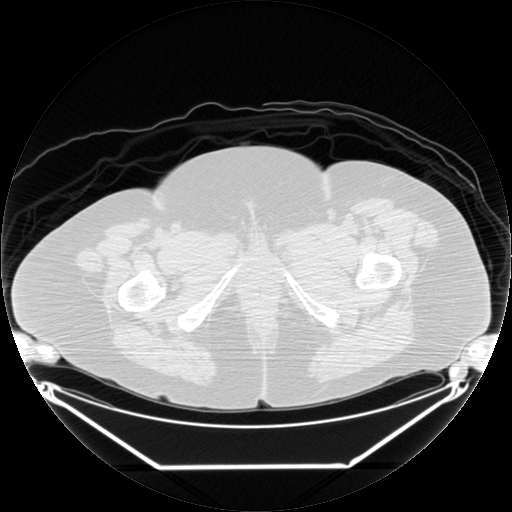
[im 77/267]
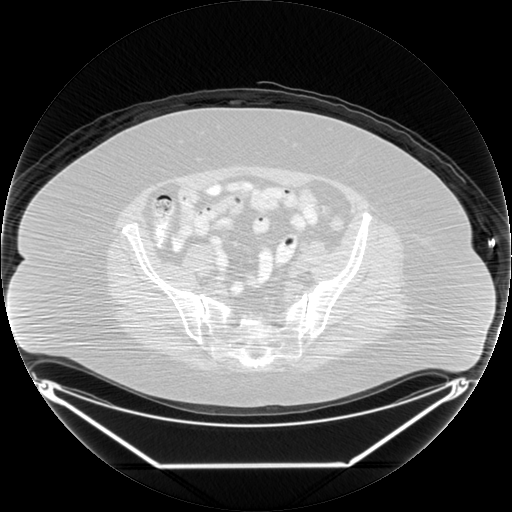
[im 115/267]
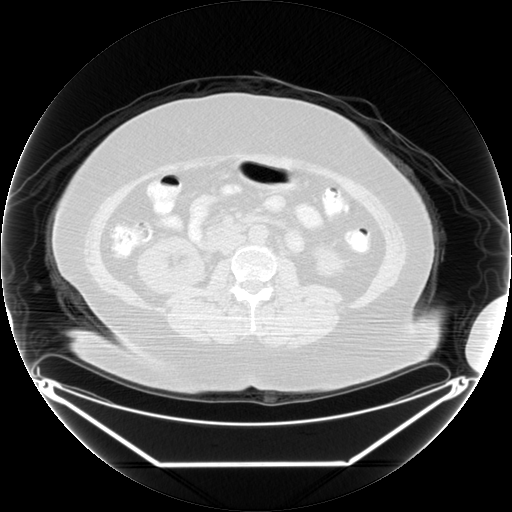
[im 153/267]
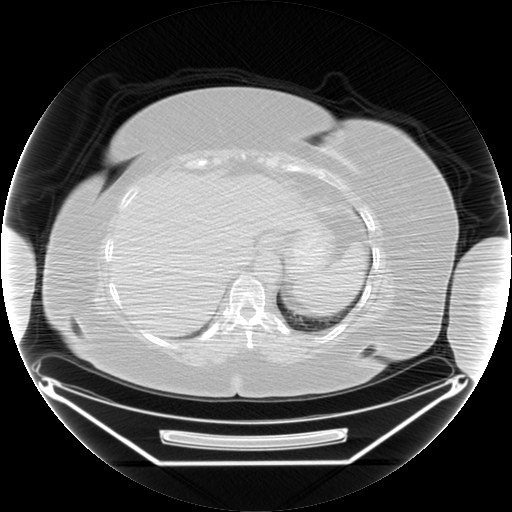
[im 191/267]
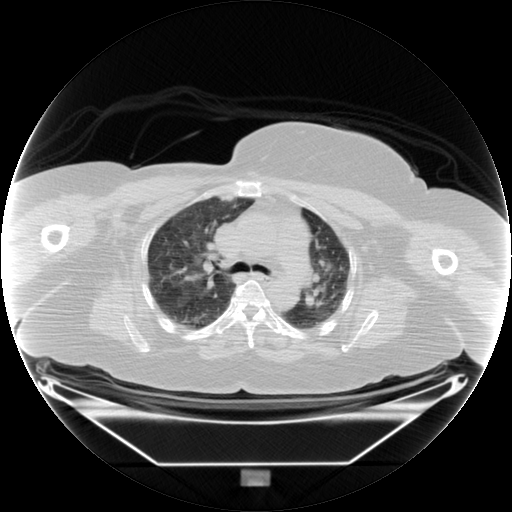
[im 229/267  full-range]
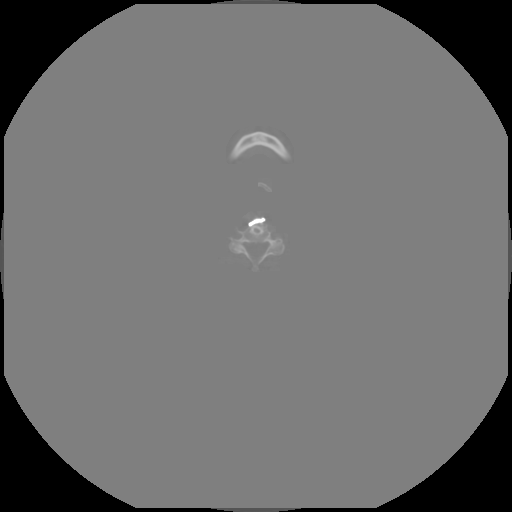
[im 267/267  brain]
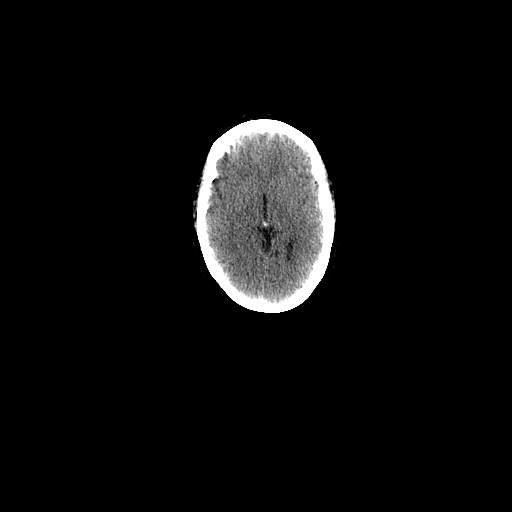

[25 of 25 positions shown; findings below may reference images not displayed]

FINDINGS: No abnormal metabolic activity is identified in the neck, chest,
abdomen or pelvis to suggest metastatic disease. Physiologic activity is present
in the vocal cords symmetrically. There is physiologic activity in the left
ventricle. Physiologic excretion into the urinary tract is noted.
IMPRESSION: No evidence of recurrent or metastatic disease.

## 2006-07-21 IMAGING — CR DG CHEST 1V PORT
1 series · 1 of 1 positions shown · non-contrast
Comparison: CT chest 03/24/04.

CLINICAL DATA: This is a 53-year-old female, with right breast carcinoma. Port-A-Catheter insertion. 
 PORTABLE CHEST ? ONE VIEW:

[view not recorded]
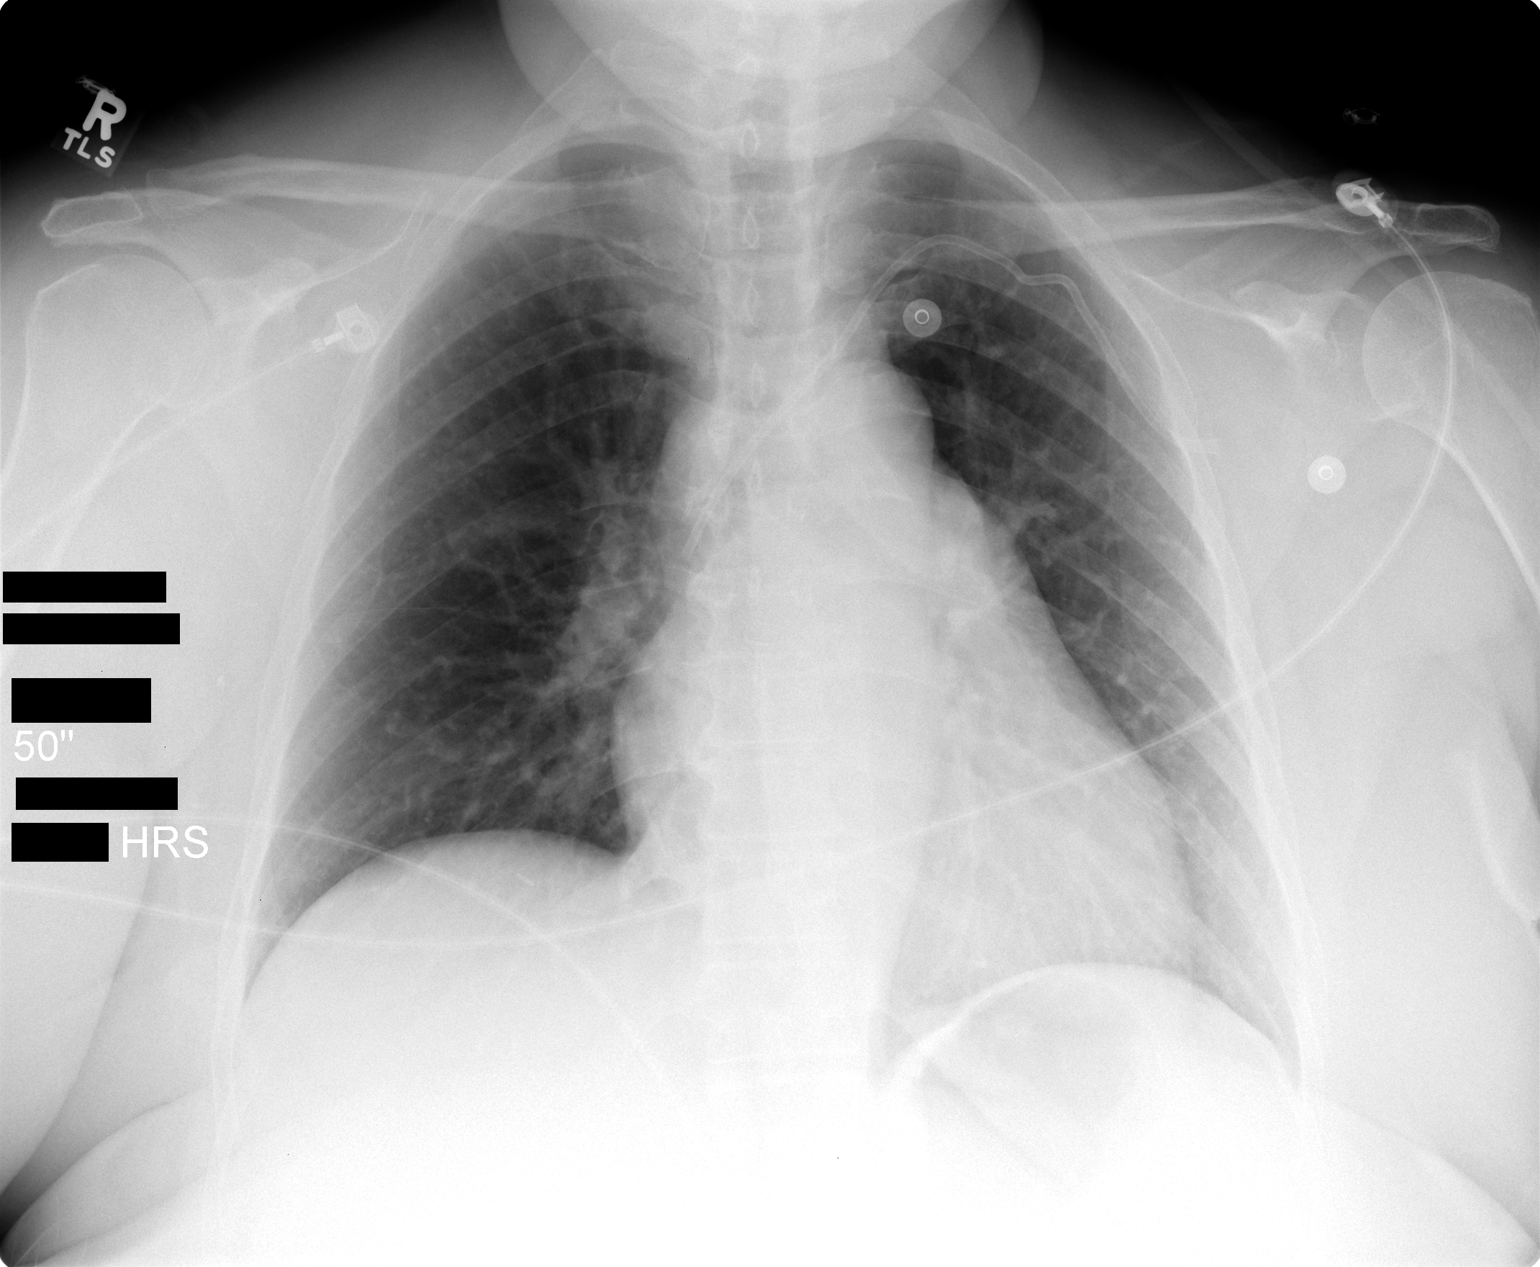

[1 of 1 positions shown; findings below may reference images not displayed]

FINDINGS: Left subclavian Port-A-Catheter tip is in the proximal SVC.  There is slight kinking of the catheter along the first rib and clavicle junction.  The heart is mildly enlarged.  No pneumothorax, acute airspace disease, significant atelectasis or effusion. 
 The patient is status-post right mastectomy.
IMPRESSION: 1.  New left subclavian Port-A-Catheter tip in the proximal SVC.  
 2.  No pneumothorax or acute change.

## 2006-08-13 IMAGING — CT CT ABDOMEN W/O CM
1 of 2 series · 15 of 32 positions shown, 19 images · IV contrast (agent unspecified)
Comparison: 03/24/04 study.

CLINICAL DATA: Lower abdominal pain.
TECHNIQUE: Multidetector helical study performed without IV and without oral contrast with a renal stone protocol.
 CT OF THE ABDOMEN WITHOUT CONTRAST:

[Series 2: abd/pelvis 5.0 b10f · axial · 0.68mm/px · z∈[-297,+138]mm · 15 of 95 slices shown, 19 images]
[im 4/95  soft-tissue]
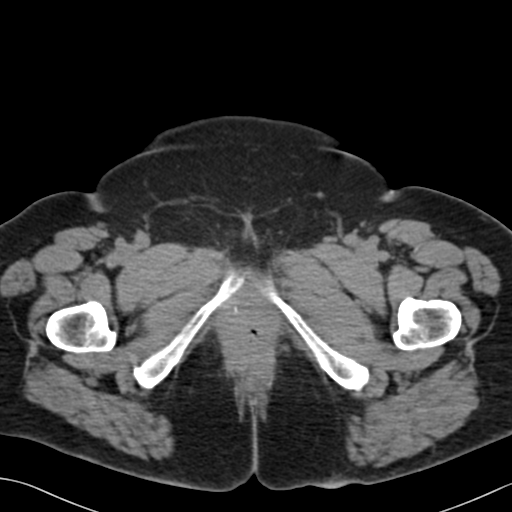
[im 4/95  bone]
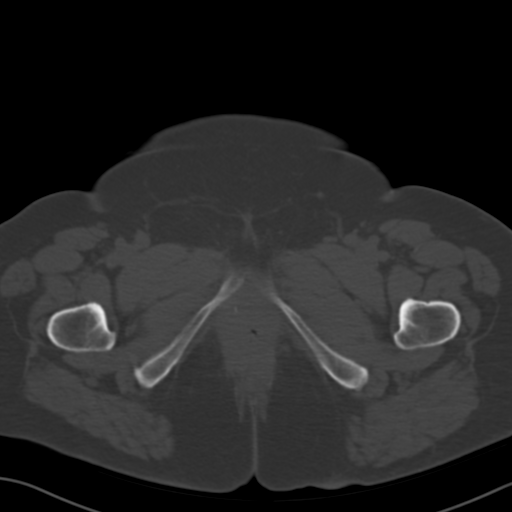
[im 12/95  soft-tissue]
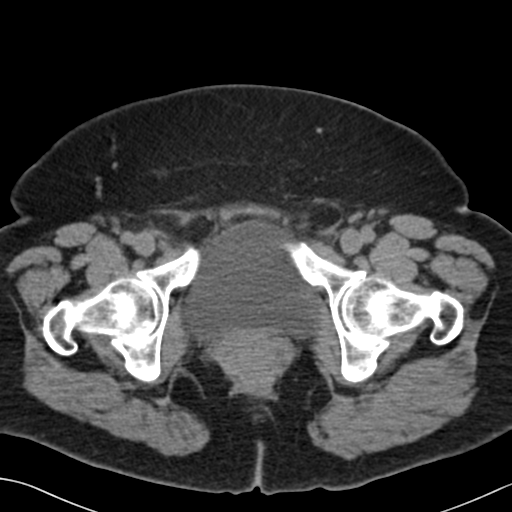
[im 20/95  soft-tissue]
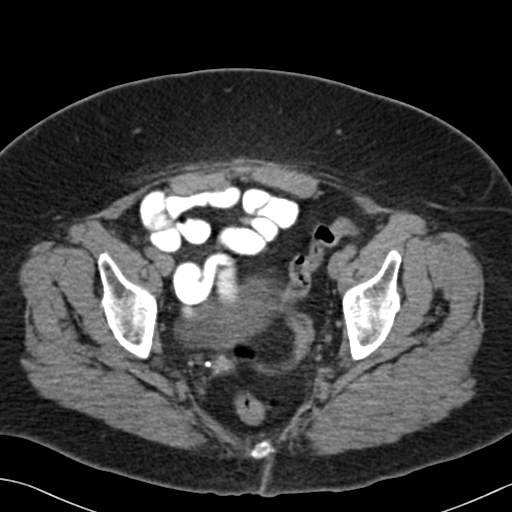
[im 28/95  soft-tissue]
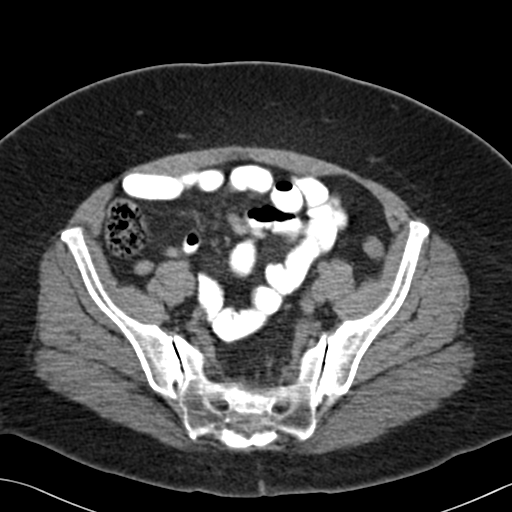
[im 32/95  soft-tissue]
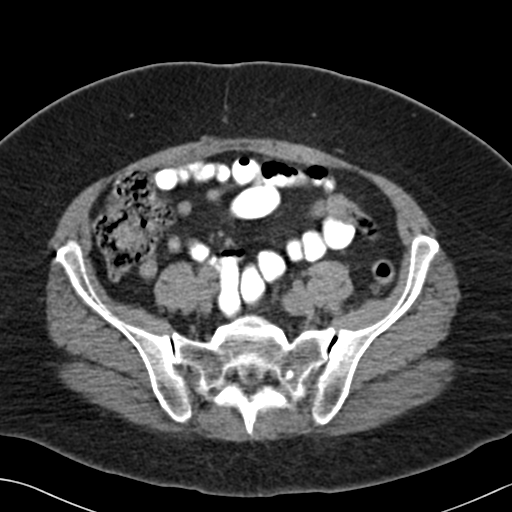
[im 40/95  soft-tissue]
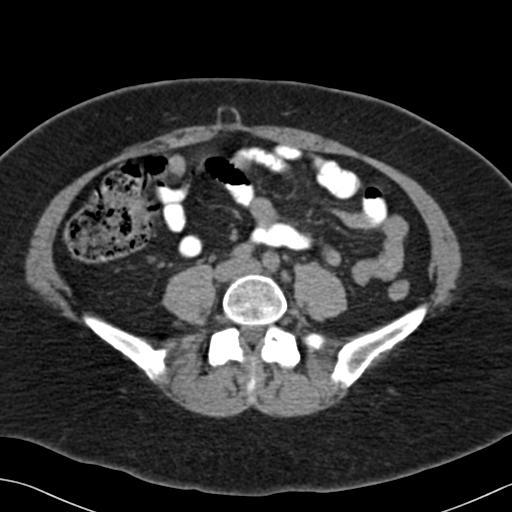
[im 48/95  soft-tissue]
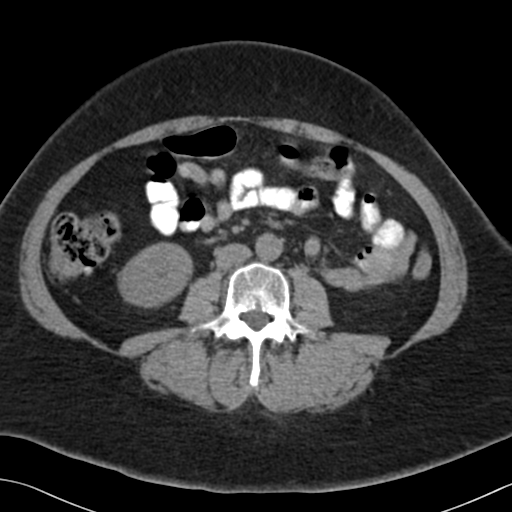
[im 55/95  soft-tissue]
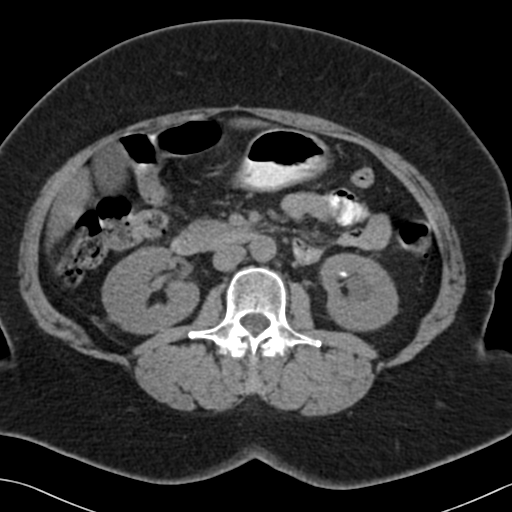
[im 63/95  soft-tissue]
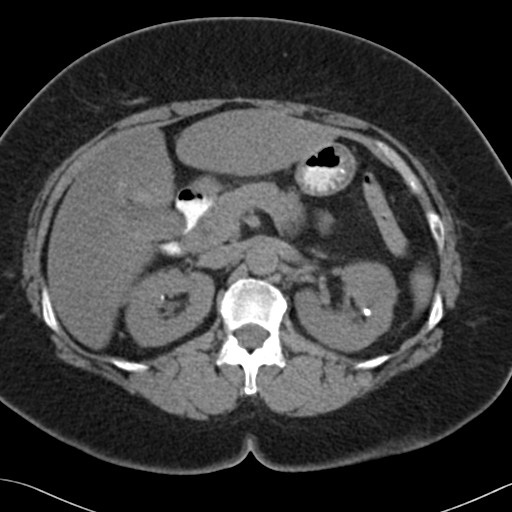
[im 63/95  bone]
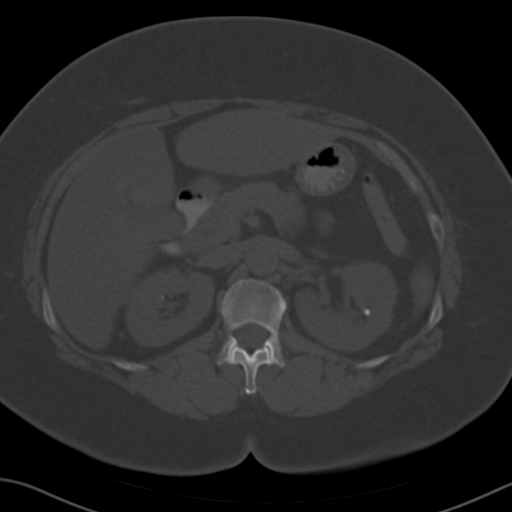
[im 67/95  soft-tissue]
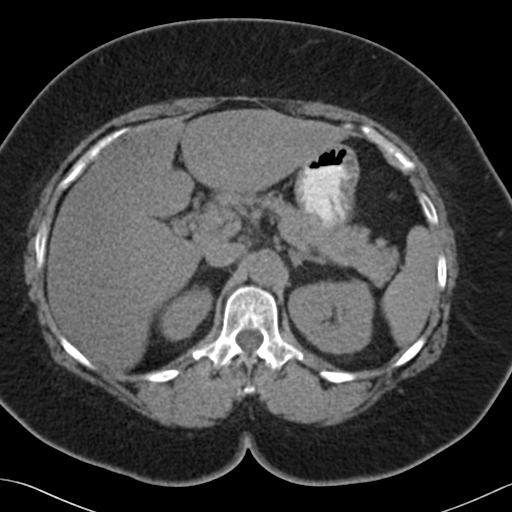
[im 75/95  soft-tissue]
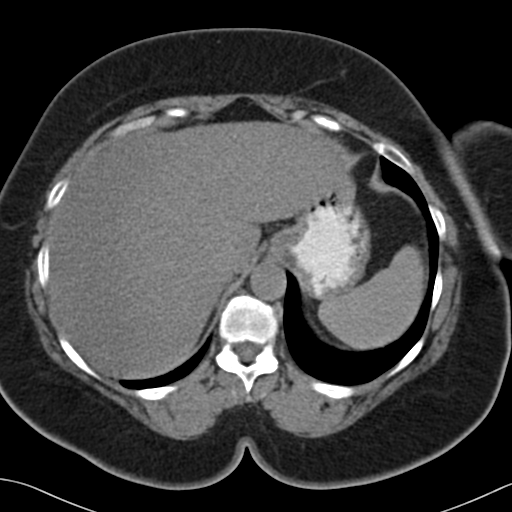
[im 79/95  lung]
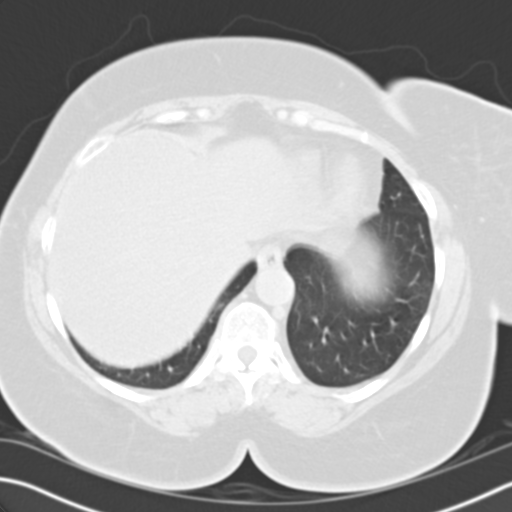
[im 83/95  soft-tissue]
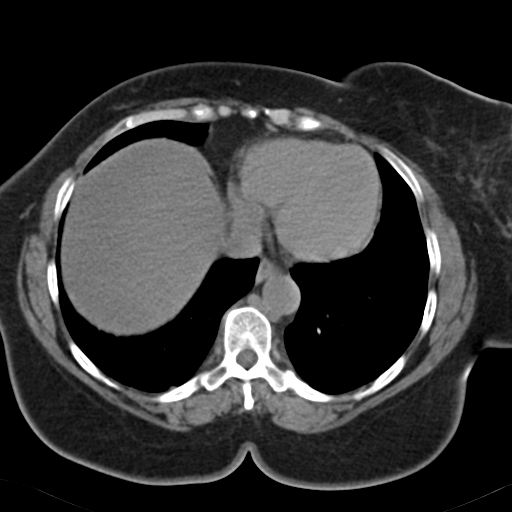
[im 83/95  lung]
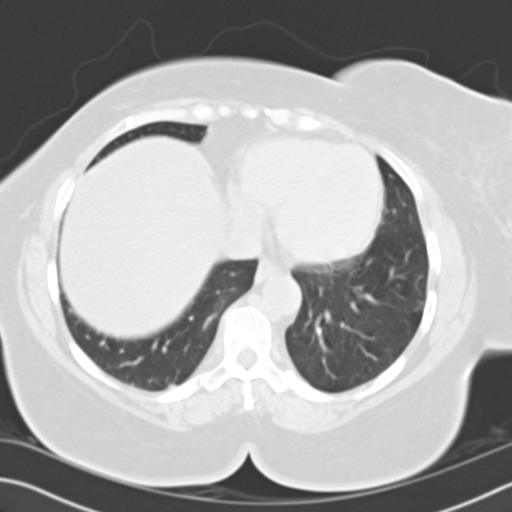
[im 87/95  lung]
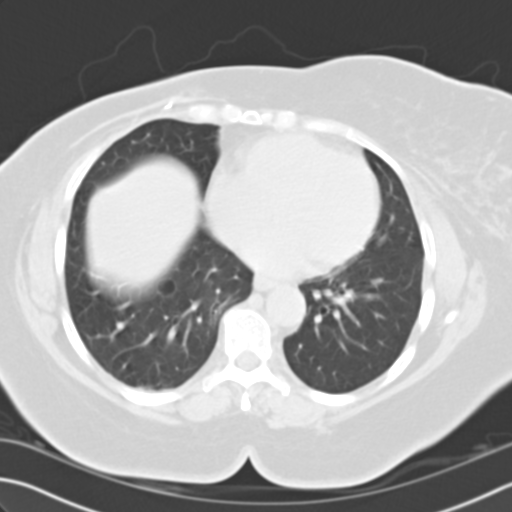
[im 91/95  soft-tissue]
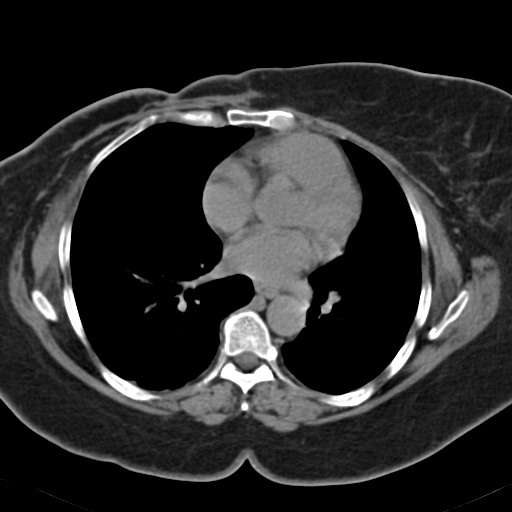
[im 91/95  lung]
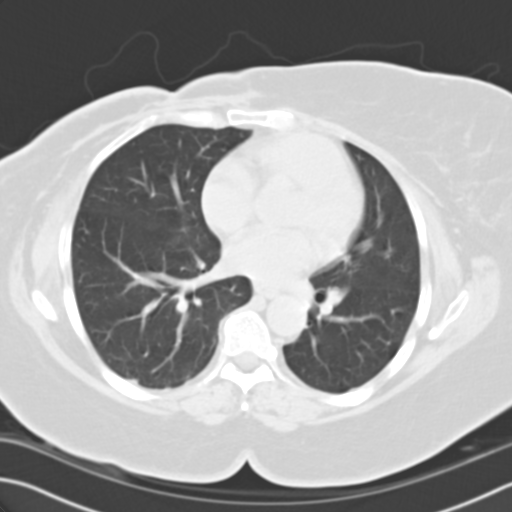

[15 of 32 positions shown; findings below may reference images not displayed]

There has been a previous right mastectomy.  There are bilateral renal calculi present which are unchanged when compared with the previous study.  There is no hydronephrosis.  There is a calcification seen anterior to the right psoas muscle (image # [id number]) consistent with a right ovarian vein phlebolith.  This is unchanged from the previous study.  The liver, spleen, pancreas, and adrenal glands have a normal appearance.  There is no adenopathy.  The abdominal aorta is normal in size.  There has been no significant interval change.
IMPRESSION: Bilateral renal calculi ? stable.  No significant change and no findings to account for the patient?s abdominal pain.
 CT OF THE PELVIS WITHOUT CONTRAST ? STONE PROTOCOL:
 There is no pelvis mass or adenopathy.  There has been a previous hysterectomy.  There is no free pelvic fluid.  The appendix is visualized and has a normal appearance.
IMPRESSION: Negative CT of the abdomen.

## 2006-08-13 IMAGING — CR DG ABDOMEN ACUTE W/ 1V CHEST
4 series · 4 of 4 positions shown · non-contrast
Comparison: Portable chest x-ray of 03/29/04.

CLINICAL DATA: Abdominal and chest pain ? post-chemotherapy.  
 ACUTE ABDOMEN WITH CHEST:

[view not recorded (1 of 4)]
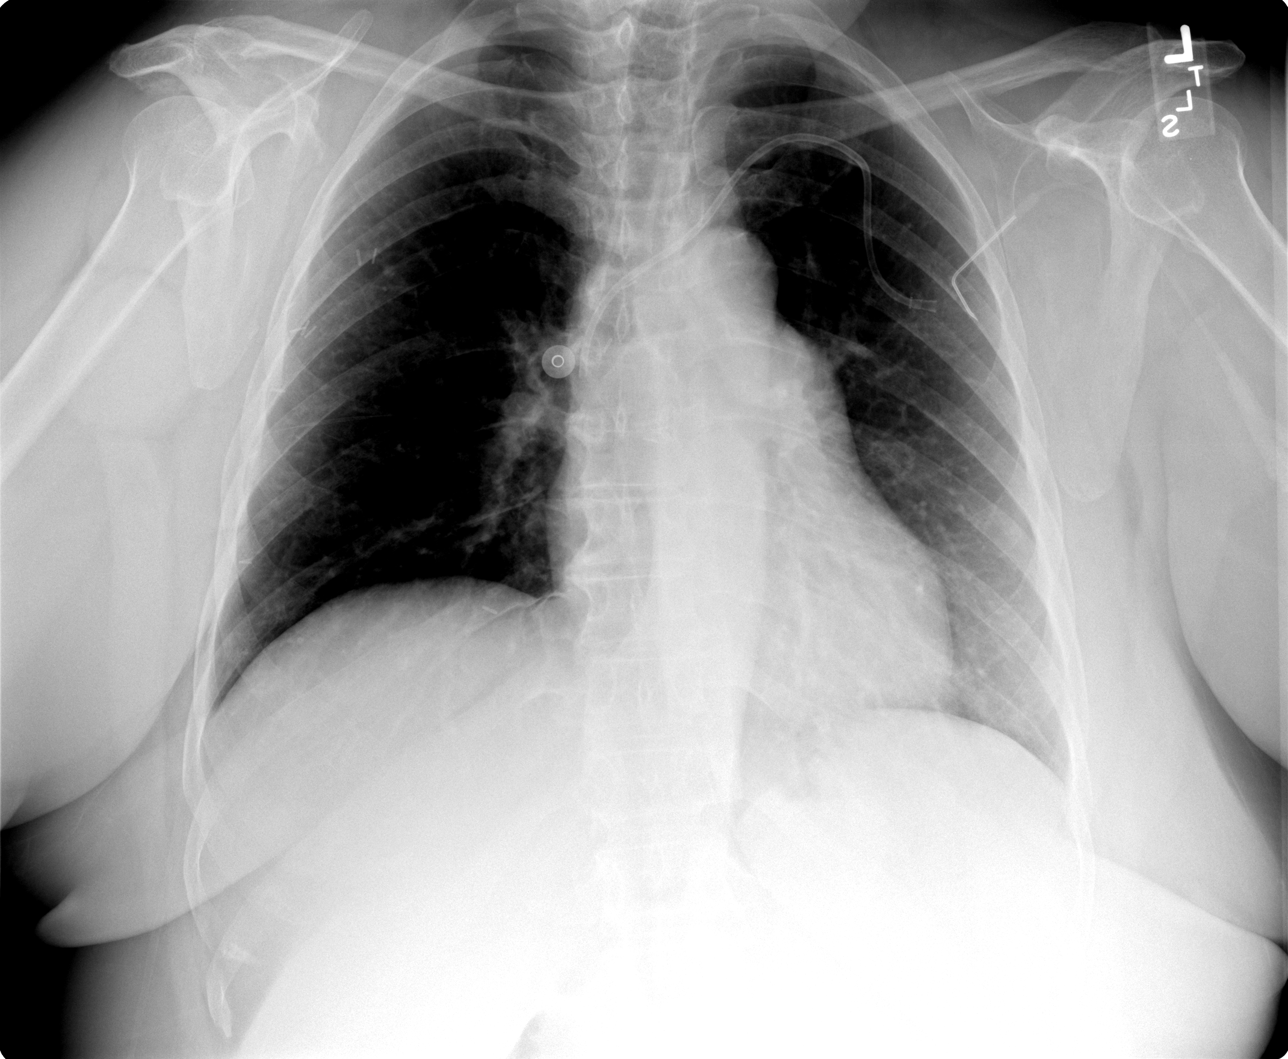

[view not recorded (2 of 4)]
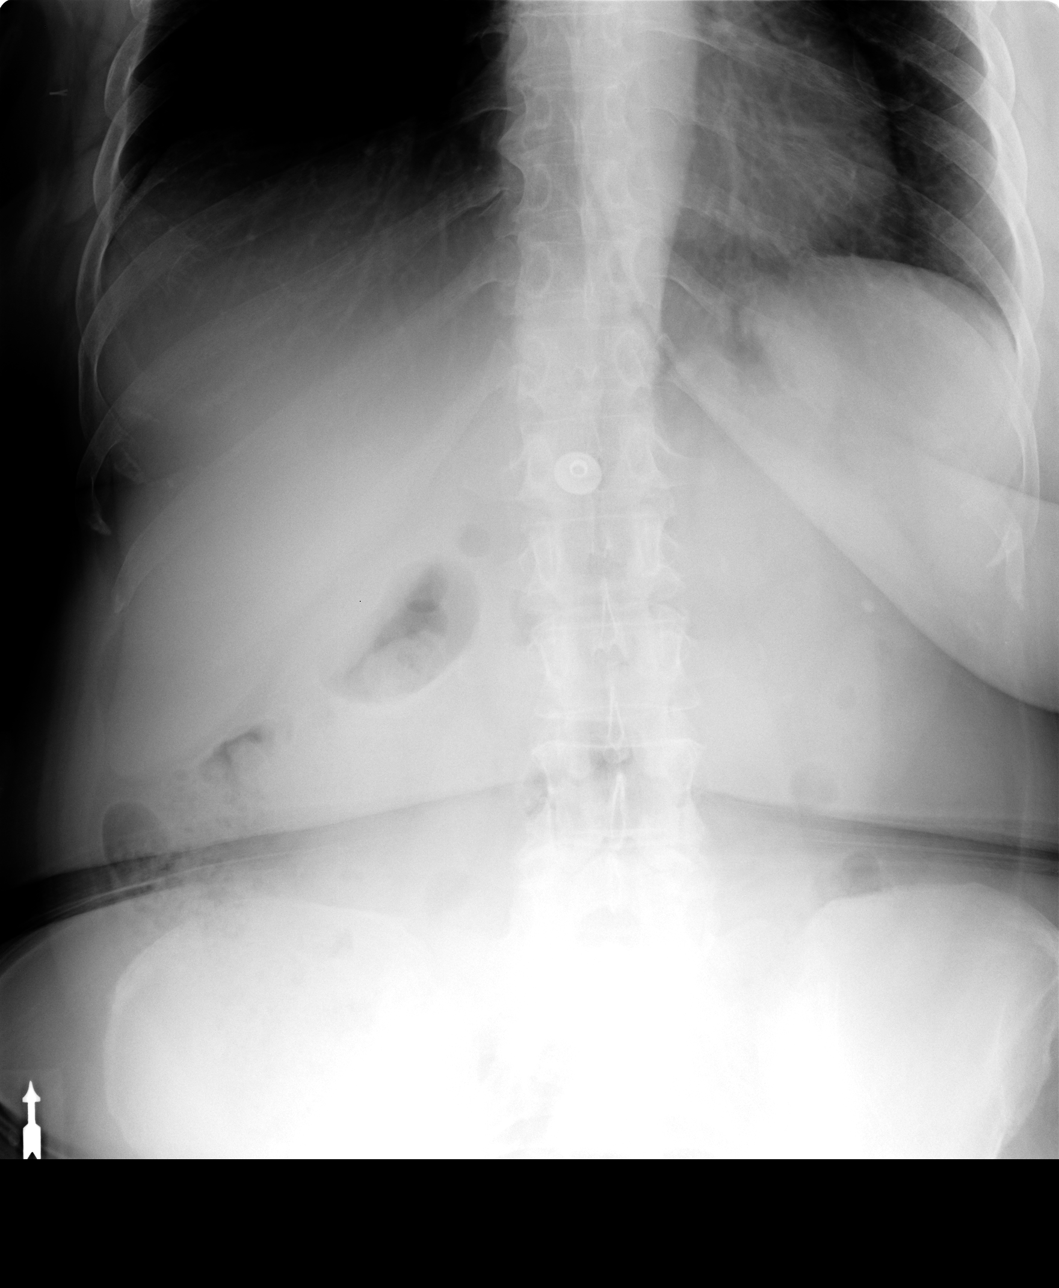

[view not recorded (3 of 4)]
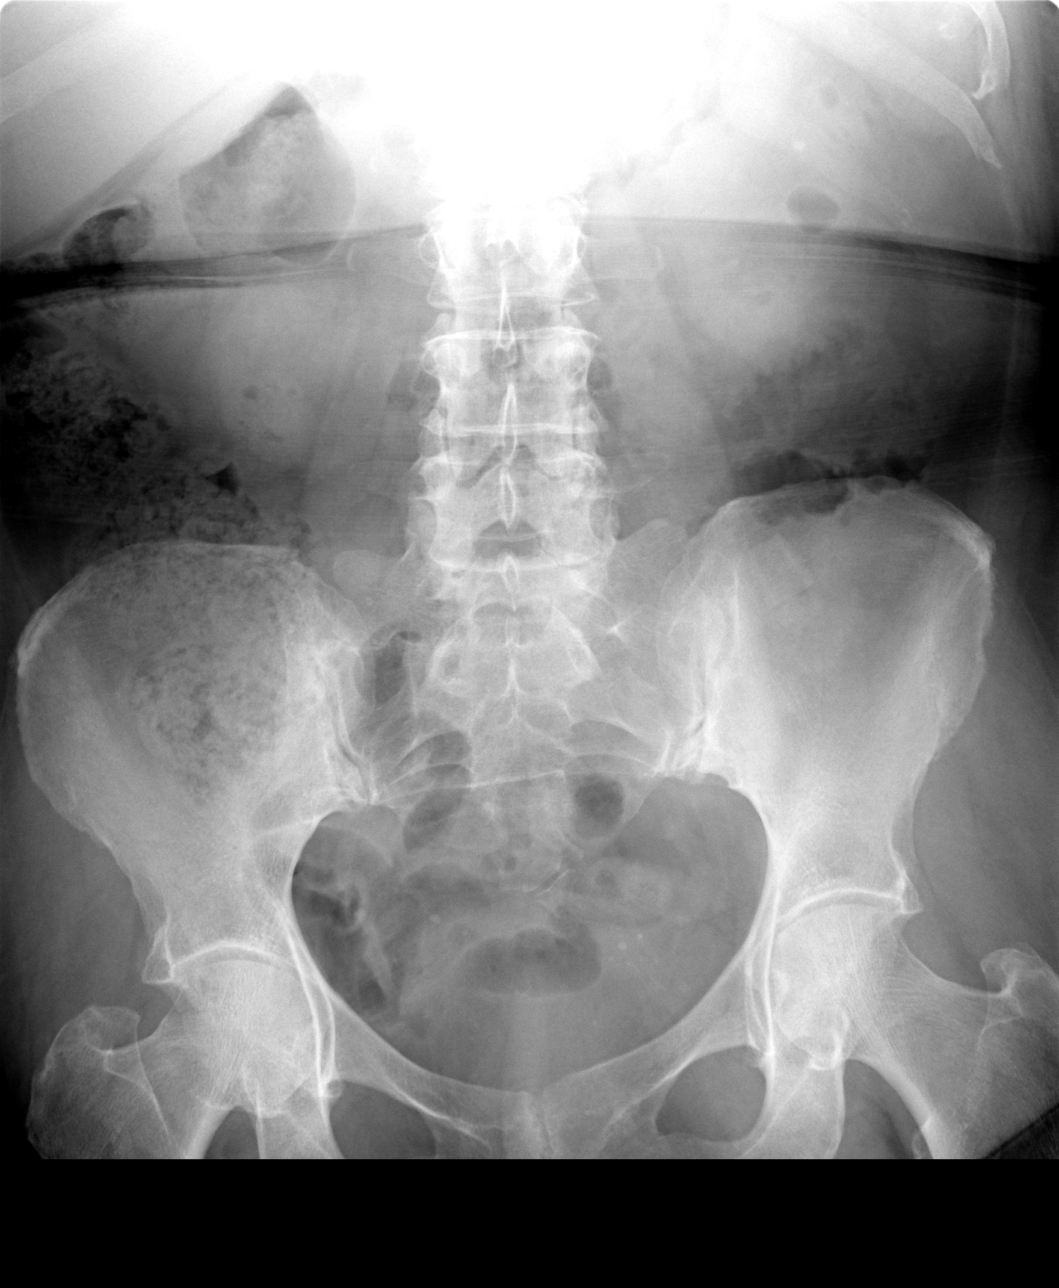

[view not recorded (4 of 4)]
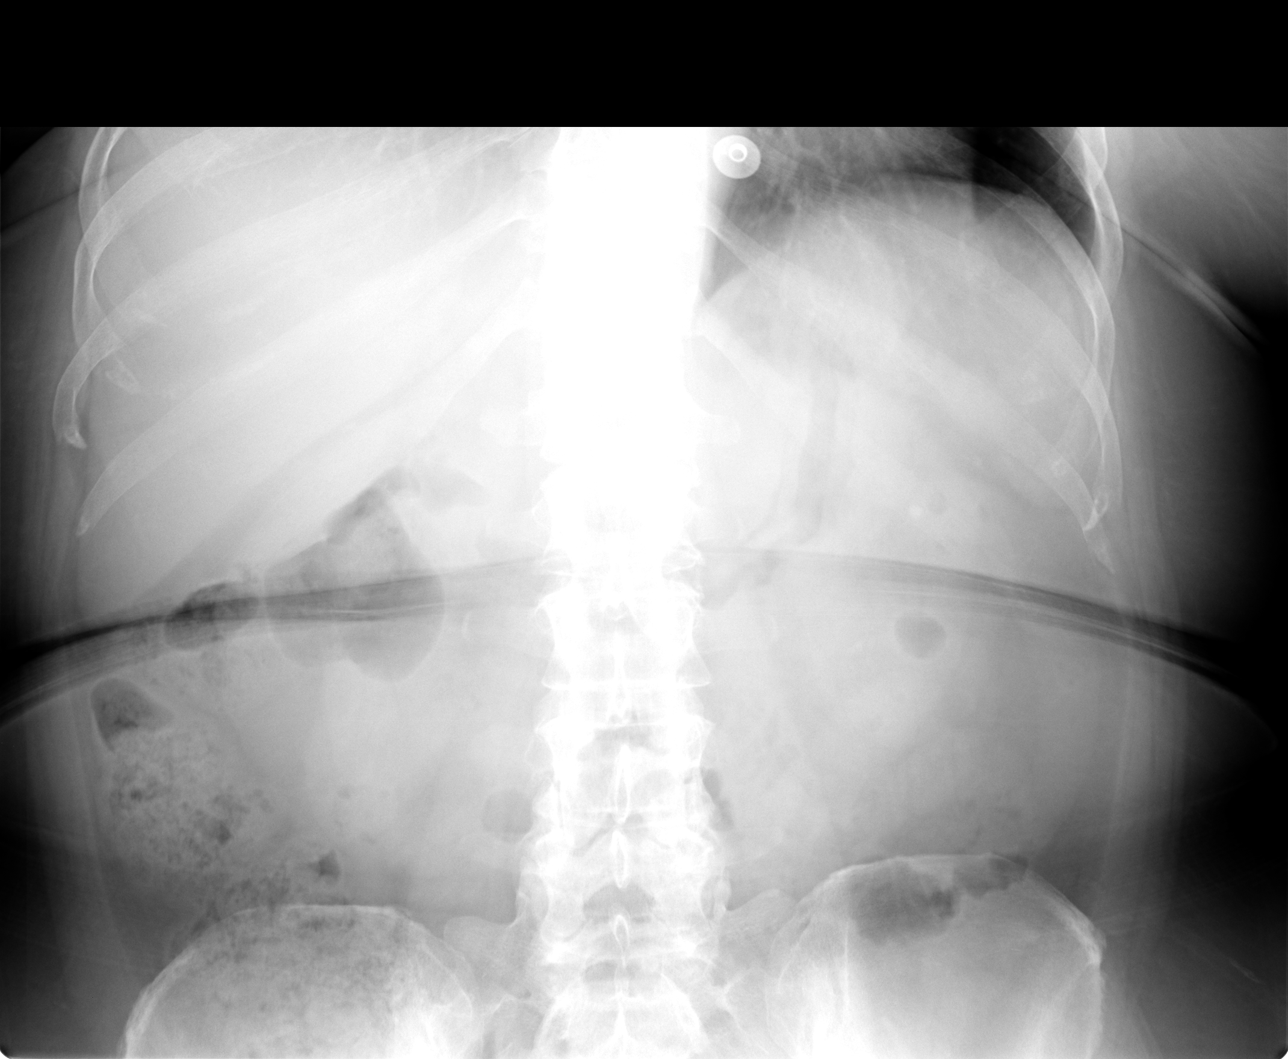

[4 of 4 positions shown; findings below may reference images not displayed]

FINDINGS: Heart and lungs remain normal.  Left subclavian Port-A-Cath unchanged.  Right axillary node dissection.  
 No free air or acute/specific abnormality of the bowel gas pattern.  Psoas margins intact.  There is a 5 mm left renal calculus, but no obvious ureteral calculi.  Osseous structures intact.
IMPRESSION: 1.  No active cardiopulmonary disease. 
 2.  No acute or specific abdominal findings, except for a 5 mm renal calculus.

## 2006-08-15 IMAGING — CR DG CHEST 2V
2 series · 2 of 2 positions shown · non-contrast
Comparison: 03/29/04.

CLINICAL DATA: History of breast carcinoma.
 CHEST-TWO VIEW:

[view not recorded (1 of 2)]
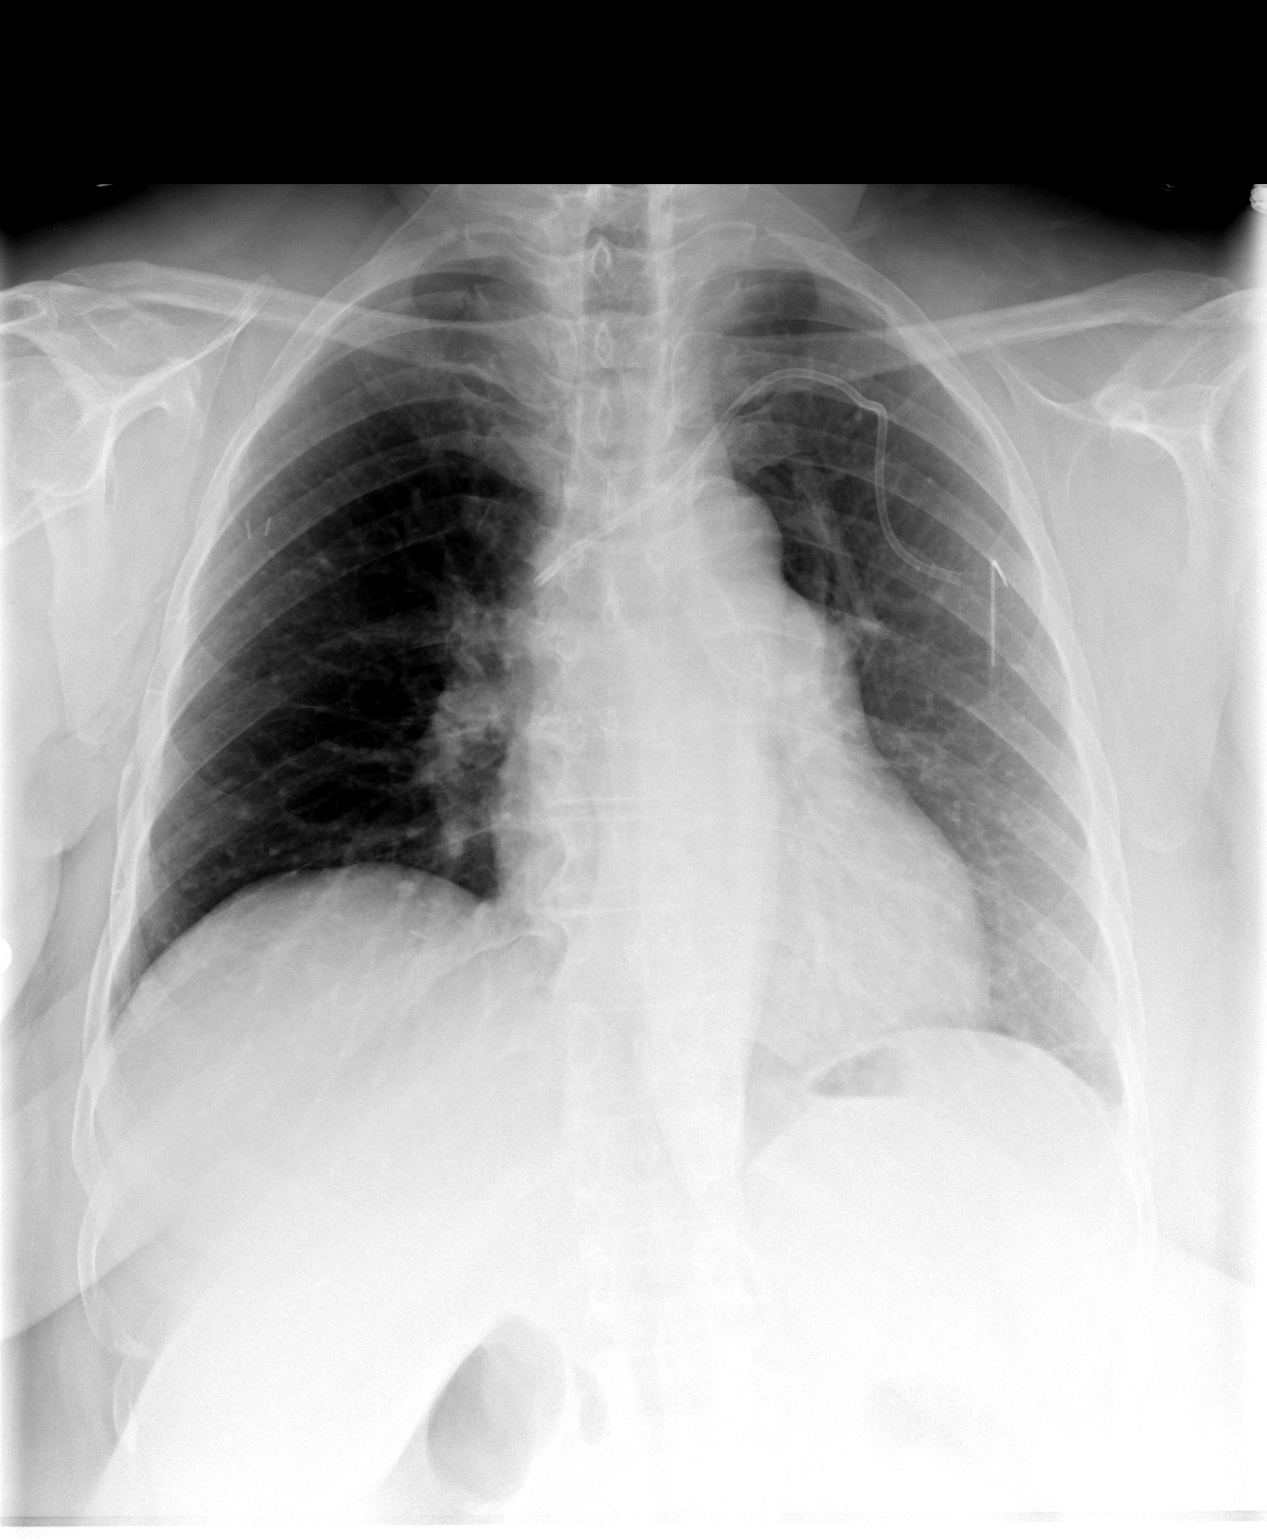

[view not recorded (2 of 2)]
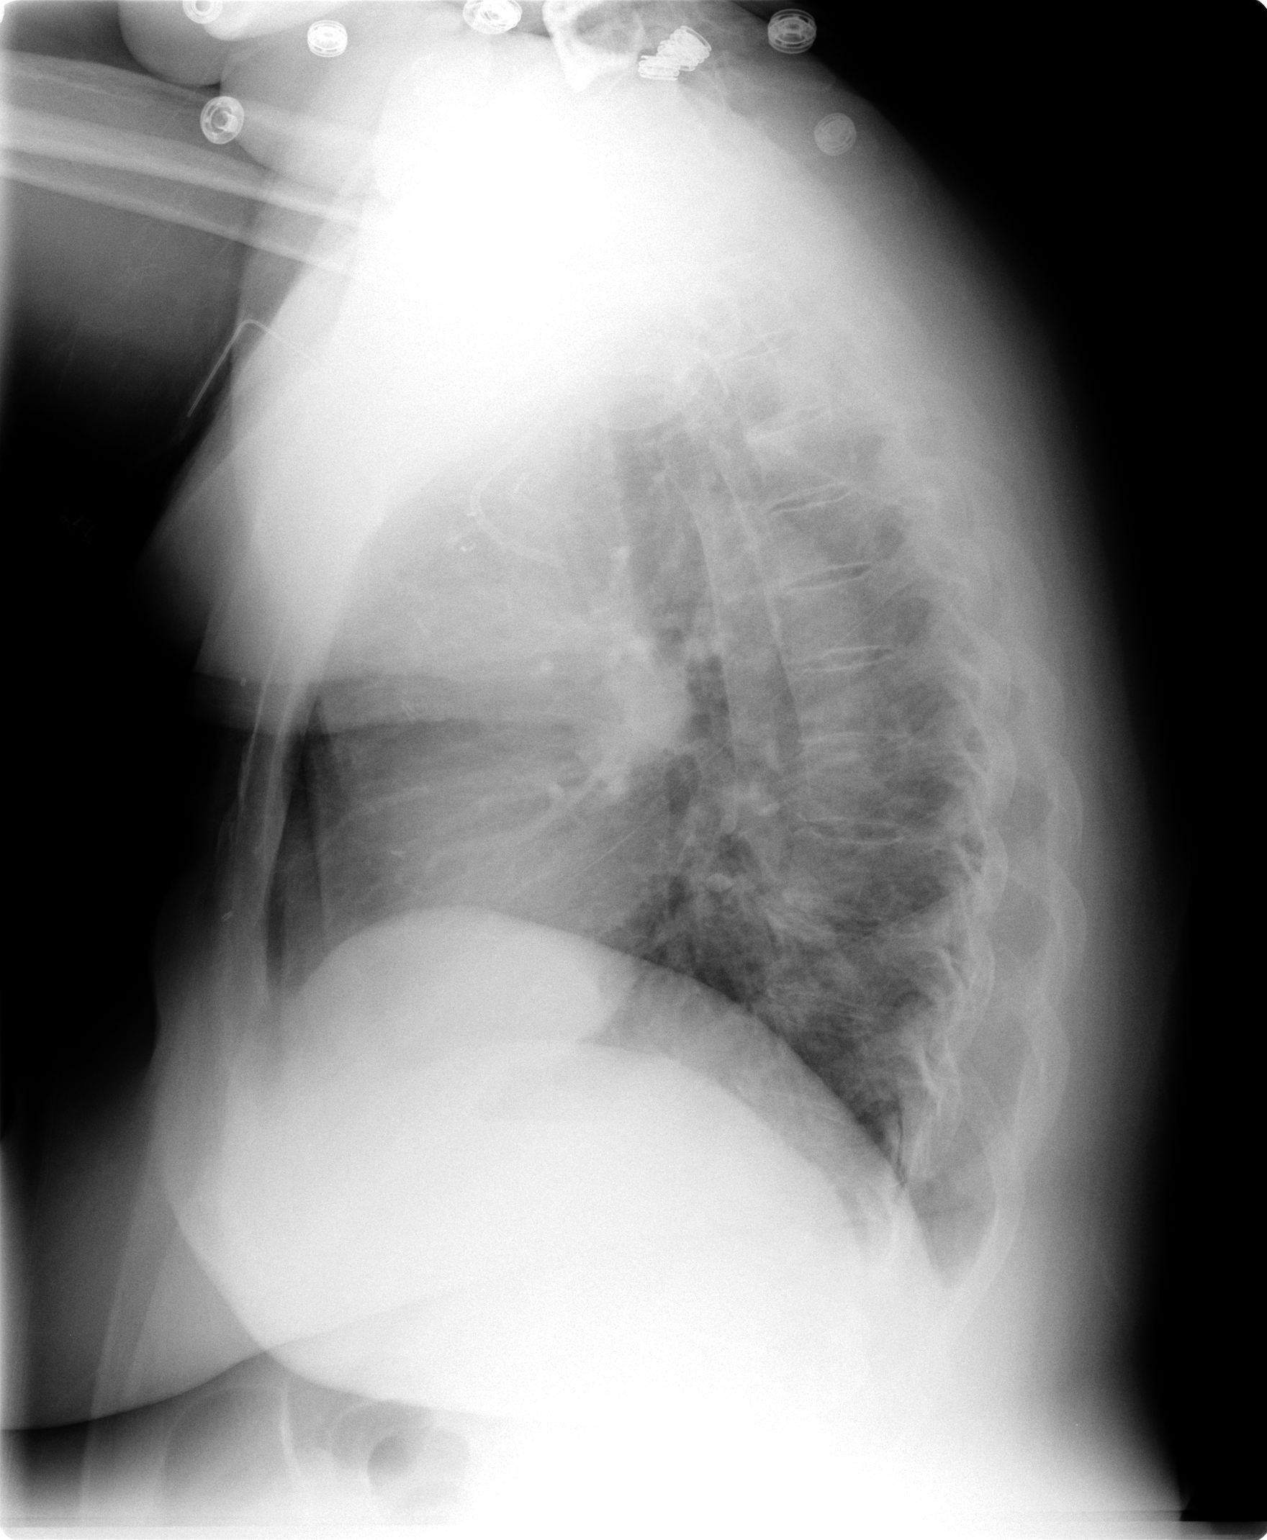

[2 of 2 positions shown; findings below may reference images not displayed]

Left subclavian port-a-catheter remains in place.  The catheter tip is retracted slightly and now lies at the confluence of the brachiocephalic veins. Lungs show no evidence of infiltrate, edema, or nodules.  No pleural effusions identified. Heart size is normal.  Visualized bony thorax is unremarkable.
IMPRESSION: No active disease. Port-a-catheter tip lies at the confluence of the brachiocephalic veins.

## 2006-08-18 IMAGING — RF DG UGI W/ SMALL BOWEL
14 of 24 series · 14 of 24 positions shown · non-contrast
Comparison: none

CLINICAL DATA: Breast cancer.  Abdominal pain in the right abdomen.
 UPPER G.I. WITH SMALL BOWEL FOLLOW THROUGH WITH HIGH DENSITY BARIUM:
 Comparing CT of the abdomen from 04/21/04.

[Series 1: run · 1 of 1 slices shown (1 of 14)]
[im 1/1]
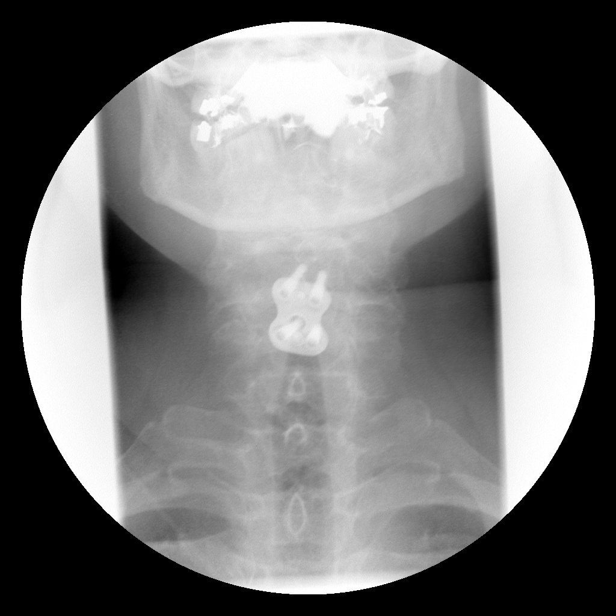

[Series 3: run · 1 of 7 slices shown (2 of 14)]
[im 1/7]
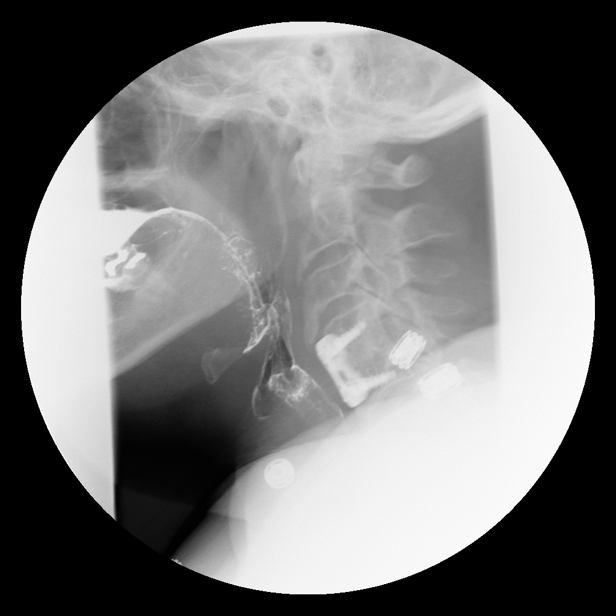

[Series 5: run · 1 of 1 slices shown (3 of 14)]
[im 1/1]
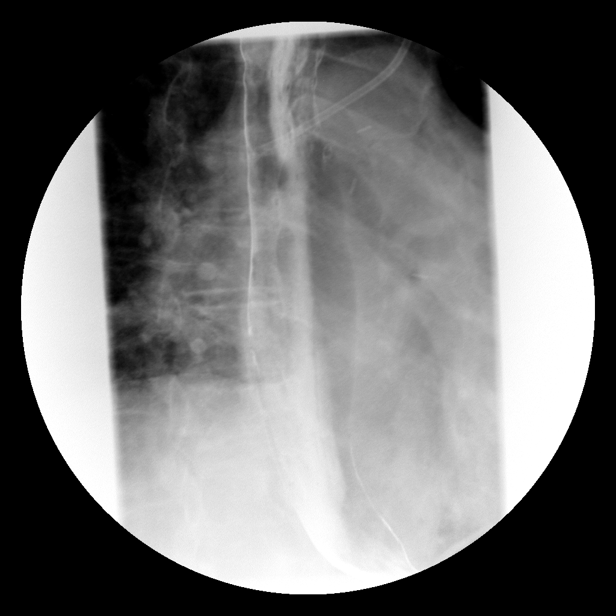

[Series 7: run · 1 of 1 slices shown (4 of 14)]
[im 1/1]
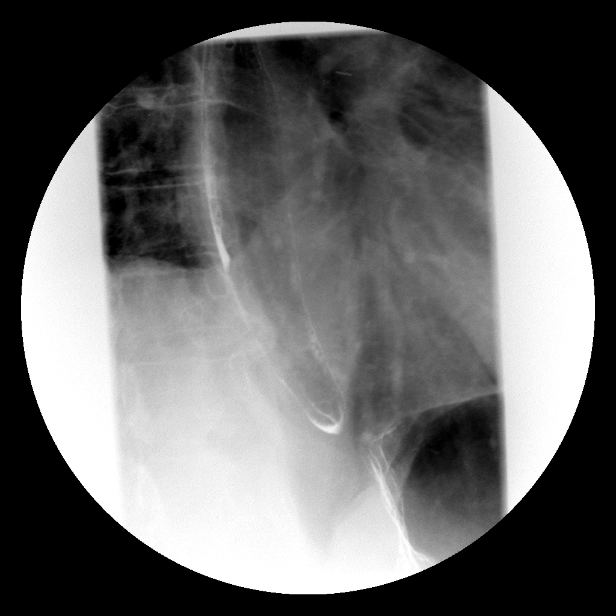

[Series 8: run · 1 of 1 slices shown (5 of 14)]
[im 1/1]
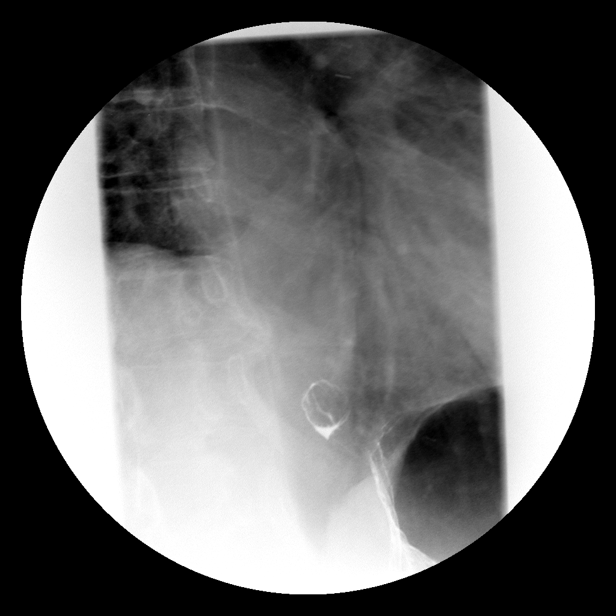

[Series 10: run · 1 of 1 slices shown (6 of 14)]
[im 1/1]
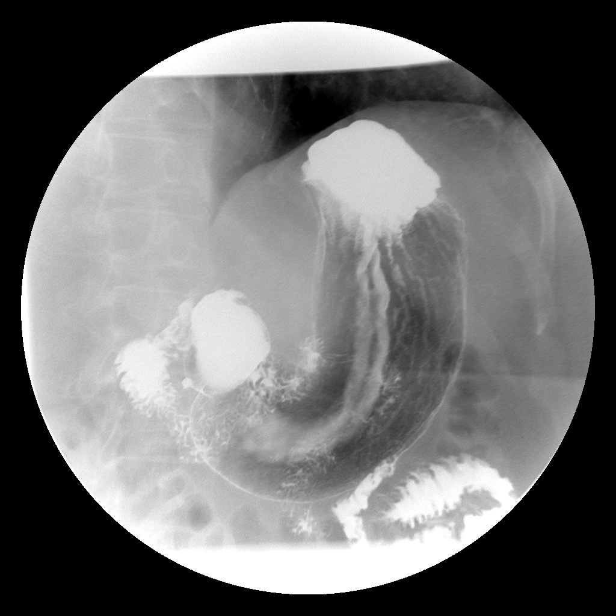

[Series 12: run · 1 of 1 slices shown (7 of 14)]
[im 1/1]
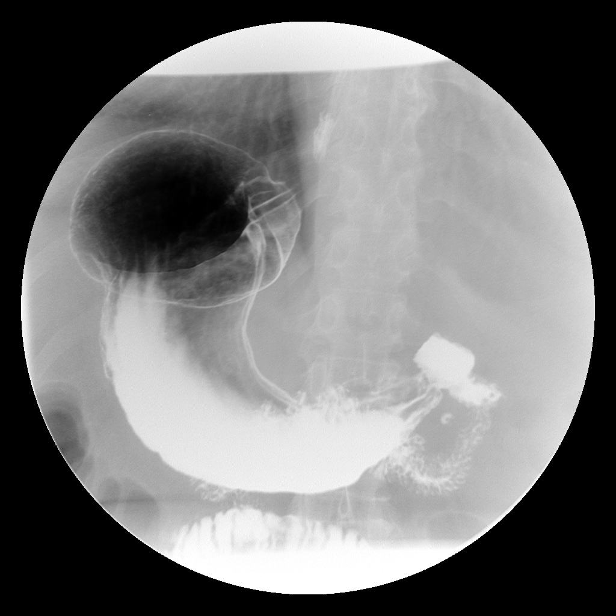

[Series 13: run · 1 of 1 slices shown (8 of 14)]
[im 1/1]
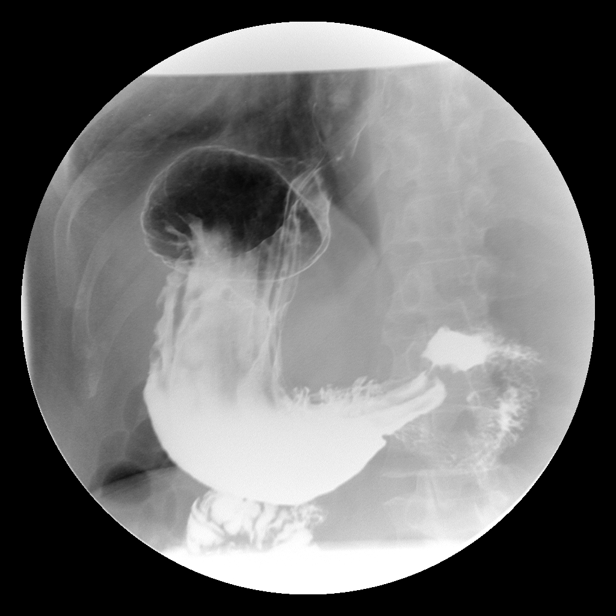

[Series 15: run · 1 of 1 slices shown (9 of 14)]
[im 1/1]
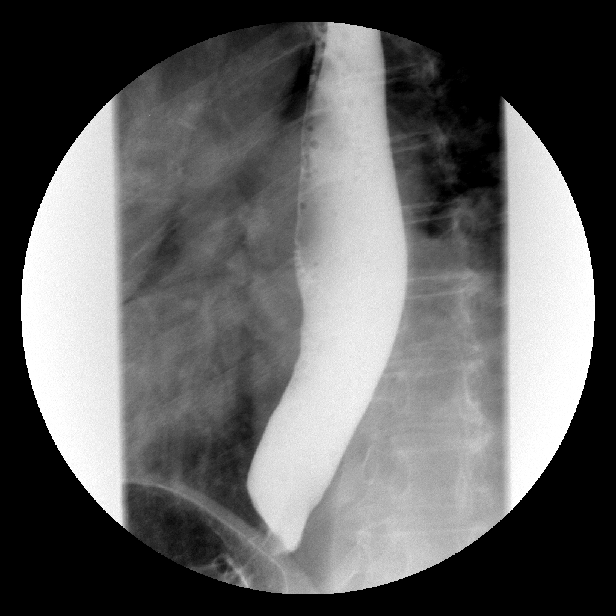

[Series 17: run · 1 of 1 slices shown (10 of 14)]
[im 1/1]
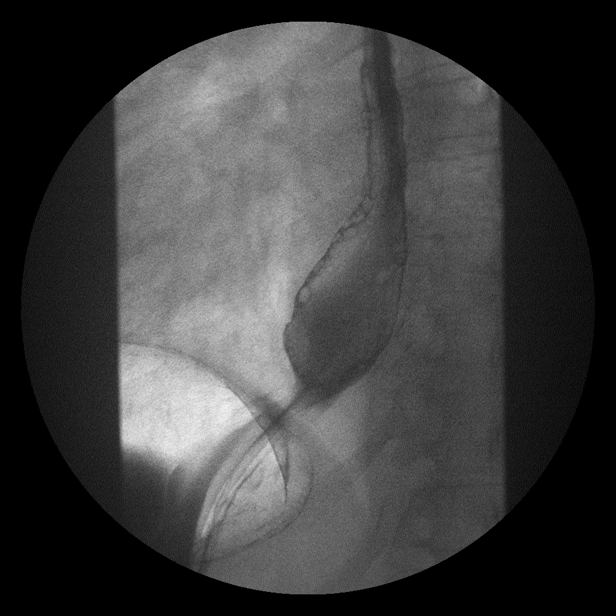

[Series 19: run · 1 of 1 slices shown (11 of 14)]
[im 1/1]
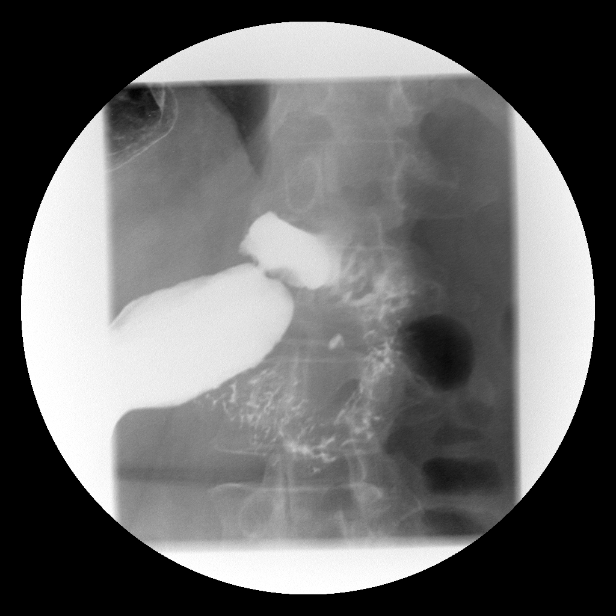

[Series 20: run · 1 of 1 slices shown (12 of 14)]
[im 1/1]
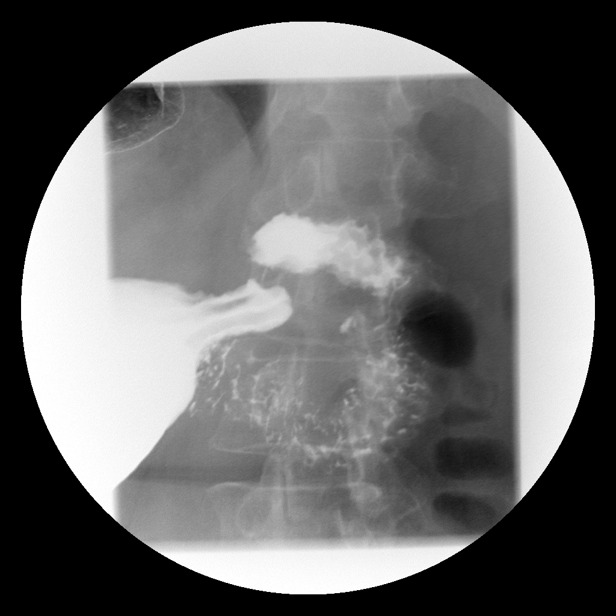

[Series 22: run · 1 of 1 slices shown (13 of 14)]
[im 1/1]
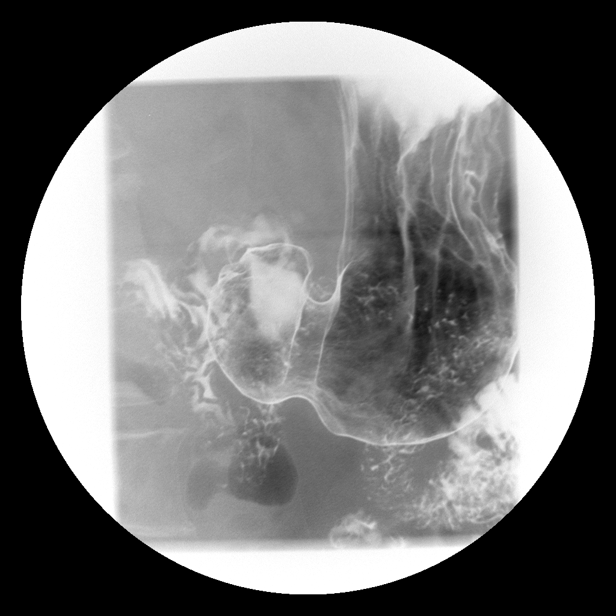

[Series 24: run · 1 of 1 slices shown (14 of 14)]
[im 1/1]
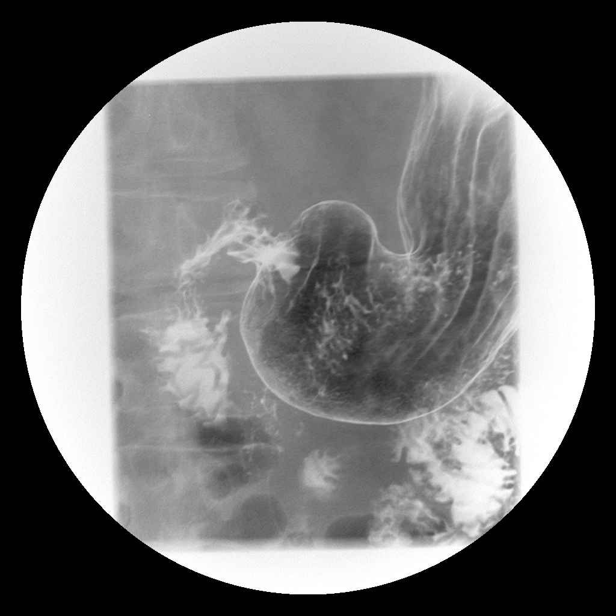

[14 of 24 positions shown; findings below may reference images not displayed]

FINDINGS: Initial KUB demonstrates an unremarkable bowel gas pattern. 
 During the pharyngeal phase of swallowing, there was laryngeal penetration to the level of the cords, secondary to somewhat delayed epiglottic inversion.  Cervical spondylosis is present and anterior cervical discectomy and fusion is noted at C4-5.  
 Primary peristaltic waves of the esophagus were within normal limits.  There is a small hiatal hernia.  A small duodenal diverticulum is evident.  No gastric mass is noted.  There were two instances of gastroesophageal reflux noted during the course of the exam. 
 SMALL BOWEL FOLLOW THROUGH:
 No dilated small bowel is evident.  There is equivocal fold effacement in the terminal ileum, although relatively normal appearing peristalsis is noted in this region.  Cannot exclude mild terminal ileum inflammation.  On the recent CT scan of 04/21/04, no definite stranding in the mesentery around the terminal was identified and the terminal ileum was not distended.  Separation of the terminal ileum from the adjacent bowel is thought to be incidental based on the CT findings rather than representing inflammation.
IMPRESSION: 1.  Hiatal hernia with gastroesophageal reflux disease. 
 2.  Laryngeal penetration with contrast.  A dedicated swallowing function study may be warranted to exclude frank tracheal aspiration with different types of oral boluses. 
 3.  Small duodenal diverticulum. 
 4.  Equivocal fold effacement in the terminal ileum.

## 2006-08-23 ENCOUNTER — Ambulatory Visit: Payer: Self-pay | Admitting: Internal Medicine

## 2006-08-23 DIAGNOSIS — E1140 Type 2 diabetes mellitus with diabetic neuropathy, unspecified: Secondary | ICD-10-CM | POA: Insufficient documentation

## 2006-08-23 DIAGNOSIS — Z8639 Personal history of other endocrine, nutritional and metabolic disease: Secondary | ICD-10-CM

## 2006-08-23 DIAGNOSIS — R51 Headache: Secondary | ICD-10-CM | POA: Insufficient documentation

## 2006-08-23 DIAGNOSIS — R519 Headache, unspecified: Secondary | ICD-10-CM | POA: Insufficient documentation

## 2006-08-23 DIAGNOSIS — Z862 Personal history of diseases of the blood and blood-forming organs and certain disorders involving the immune mechanism: Secondary | ICD-10-CM | POA: Insufficient documentation

## 2006-08-23 DIAGNOSIS — E1149 Type 2 diabetes mellitus with other diabetic neurological complication: Secondary | ICD-10-CM | POA: Insufficient documentation

## 2006-08-23 DIAGNOSIS — I428 Other cardiomyopathies: Secondary | ICD-10-CM | POA: Insufficient documentation

## 2006-08-23 DIAGNOSIS — Z853 Personal history of malignant neoplasm of breast: Secondary | ICD-10-CM | POA: Insufficient documentation

## 2006-08-23 DIAGNOSIS — K7689 Other specified diseases of liver: Secondary | ICD-10-CM | POA: Insufficient documentation

## 2006-08-23 DIAGNOSIS — M5137 Other intervertebral disc degeneration, lumbosacral region: Secondary | ICD-10-CM | POA: Insufficient documentation

## 2006-08-23 DIAGNOSIS — M1990 Unspecified osteoarthritis, unspecified site: Secondary | ICD-10-CM | POA: Insufficient documentation

## 2006-08-23 DIAGNOSIS — E039 Hypothyroidism, unspecified: Secondary | ICD-10-CM | POA: Insufficient documentation

## 2006-08-23 DIAGNOSIS — M5030 Other cervical disc degeneration, unspecified cervical region: Secondary | ICD-10-CM | POA: Insufficient documentation

## 2006-08-23 DIAGNOSIS — Z87442 Personal history of urinary calculi: Secondary | ICD-10-CM | POA: Insufficient documentation

## 2006-08-23 DIAGNOSIS — K219 Gastro-esophageal reflux disease without esophagitis: Secondary | ICD-10-CM | POA: Insufficient documentation

## 2006-08-25 LAB — CONVERTED CEMR LAB
ALT: 22 units/L (ref 0–40)
AST: 23 units/L (ref 0–37)
Albumin: 3.9 g/dL (ref 3.5–5.2)
Alkaline Phosphatase: 52 units/L (ref 39–117)
BUN: 7 mg/dL (ref 6–23)
CO2: 30 meq/L (ref 19–32)
Chloride: 107 meq/L (ref 96–112)
Creatinine, Ser: 0.7 mg/dL (ref 0.4–1.2)
Total Bilirubin: 0.5 mg/dL (ref 0.3–1.2)

## 2006-09-13 ENCOUNTER — Ambulatory Visit: Payer: Self-pay | Admitting: Family Medicine

## 2006-09-29 ENCOUNTER — Encounter: Admission: RE | Admit: 2006-09-29 | Discharge: 2006-09-29 | Payer: Self-pay | Admitting: Orthopedic Surgery

## 2006-10-16 ENCOUNTER — Ambulatory Visit: Payer: Self-pay | Admitting: Oncology

## 2006-10-17 LAB — CBC WITH DIFFERENTIAL (CANCER CENTER ONLY)
BASO#: 0 10*3/uL (ref 0.0–0.2)
BASO%: 0.6 % (ref 0.0–2.0)
EOS%: 3.1 % (ref 0.0–7.0)
HCT: 41.2 % (ref 34.8–46.6)
HGB: 13.5 g/dL (ref 11.6–15.9)
LYMPH#: 2.4 10*3/uL (ref 0.9–3.3)
LYMPH%: 34.6 % (ref 14.0–48.0)
MCHC: 32.8 g/dL (ref 32.0–36.0)
MCV: 88 fL (ref 81–101)
MONO#: 0.4 10*3/uL (ref 0.1–0.9)
NEUT%: 55.6 % (ref 39.6–80.0)
RDW: 13.2 % (ref 10.5–14.6)

## 2006-10-17 LAB — COMPREHENSIVE METABOLIC PANEL
Alkaline Phosphatase: 63 U/L (ref 39–117)
BUN: 14 mg/dL (ref 6–23)
CO2: 22 mEq/L (ref 19–32)
Creatinine, Ser: 0.68 mg/dL (ref 0.40–1.20)
Glucose, Bld: 220 mg/dL — ABNORMAL HIGH (ref 70–99)
Total Bilirubin: 0.5 mg/dL (ref 0.3–1.2)

## 2006-10-17 LAB — LACTATE DEHYDROGENASE: LDH: 168 U/L (ref 94–250)

## 2006-10-17 LAB — CANCER ANTIGEN 27.29: CA 27.29: 17 U/mL (ref 0–39)

## 2006-10-19 ENCOUNTER — Ambulatory Visit (HOSPITAL_COMMUNITY): Admission: RE | Admit: 2006-10-19 | Discharge: 2006-10-19 | Payer: Self-pay | Admitting: Oncology

## 2006-10-19 ENCOUNTER — Ambulatory Visit: Payer: Self-pay | Admitting: Oncology

## 2006-10-20 ENCOUNTER — Encounter: Admission: RE | Admit: 2006-10-20 | Discharge: 2006-10-20 | Payer: Self-pay | Admitting: Orthopedic Surgery

## 2006-10-25 ENCOUNTER — Encounter: Payer: Self-pay | Admitting: Family Medicine

## 2006-11-03 ENCOUNTER — Encounter: Admission: RE | Admit: 2006-11-03 | Discharge: 2006-11-03 | Payer: Self-pay | Admitting: Orthopedic Surgery

## 2006-11-11 ENCOUNTER — Emergency Department (HOSPITAL_COMMUNITY): Admission: EM | Admit: 2006-11-11 | Discharge: 2006-11-11 | Payer: Self-pay | Admitting: Family Medicine

## 2006-12-05 ENCOUNTER — Encounter: Payer: Self-pay | Admitting: Family Medicine

## 2006-12-08 ENCOUNTER — Encounter (INDEPENDENT_AMBULATORY_CARE_PROVIDER_SITE_OTHER): Payer: Self-pay | Admitting: *Deleted

## 2006-12-17 IMAGING — CR DG CERVICAL SPINE COMPLETE 4+V
5 series · 5 of 5 positions shown · non-contrast
Comparison: none

CLINICAL DATA: Cervical spine fusion two years ago.  Some neck pain.
 CERVICAL SPINE:
 Five views of the cervical spine were obtained.  There is anterior fusion at C4-5.  The interbody fusion plug appears to be in good position and there may be a bony fusion at that level.  Anterior metallic fixation plate is in good position, and normal alignment is maintained.  There is diffuse anterior osteophyte formation from C2 to T1.  On oblique views, the foramina are patent.  Left central venous catheter is noted.

[w c-spine lat]
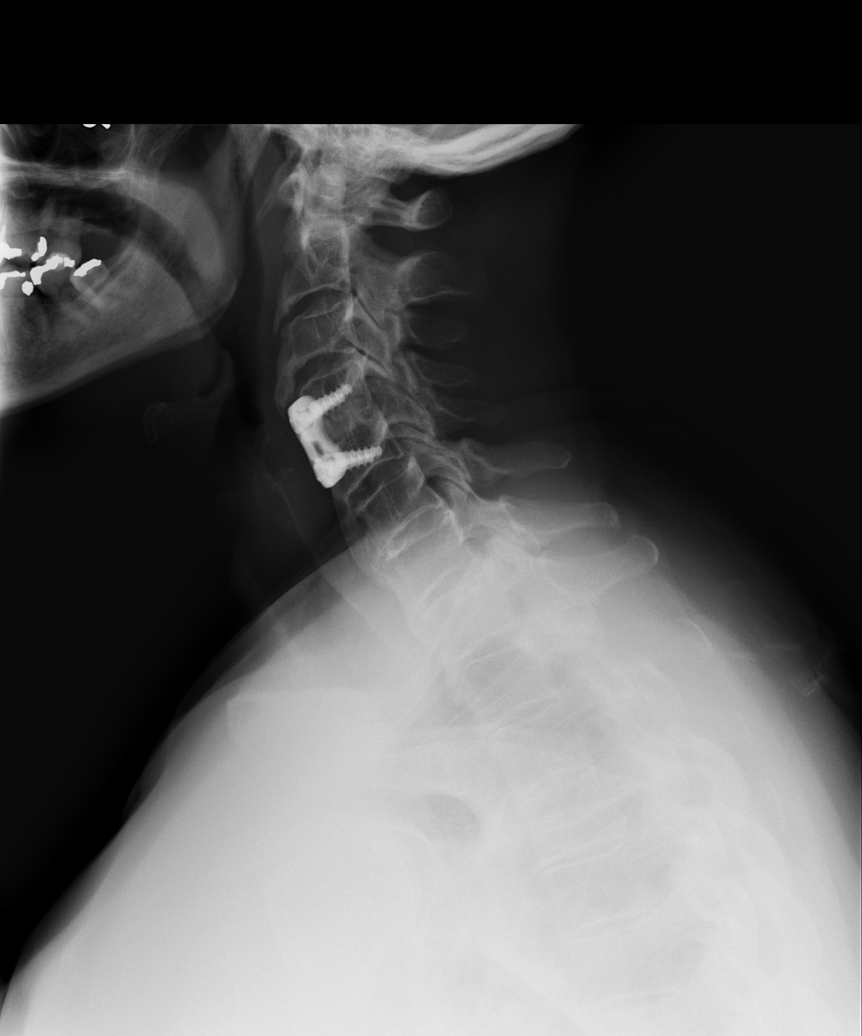

[w c-spine oblique (1 of 2)]
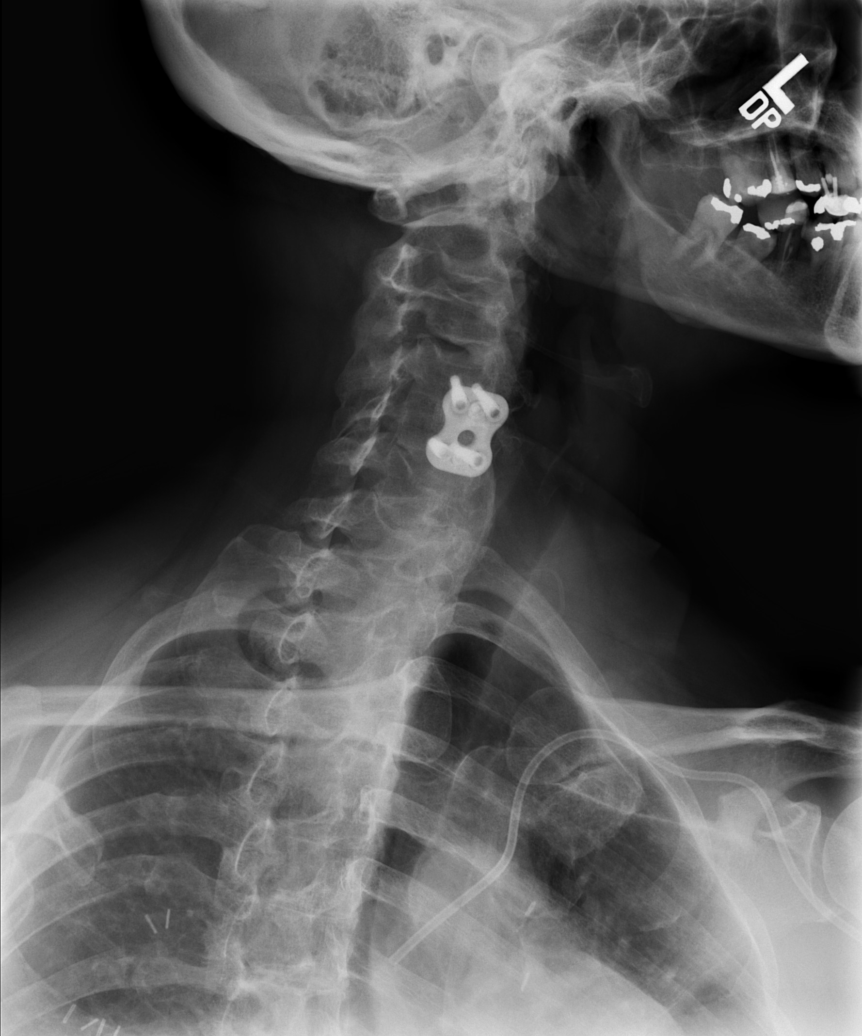

[w c-spine oblique (2 of 2)]
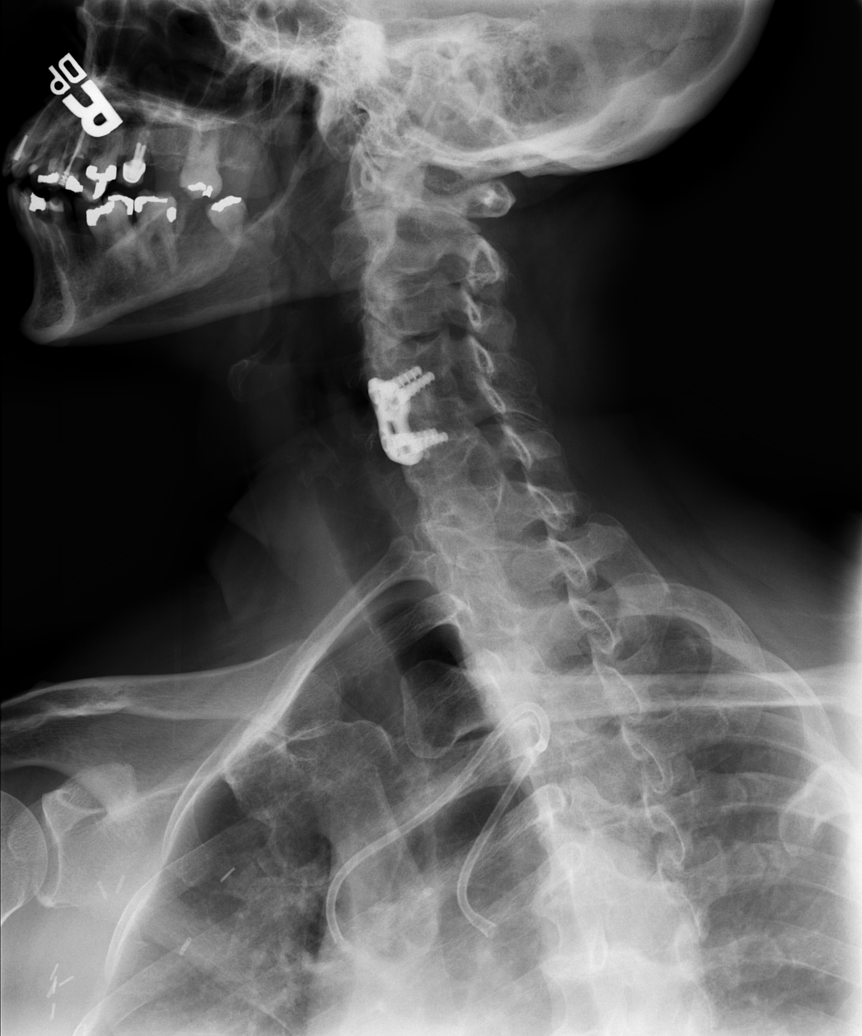

[w c-spine a.p.]
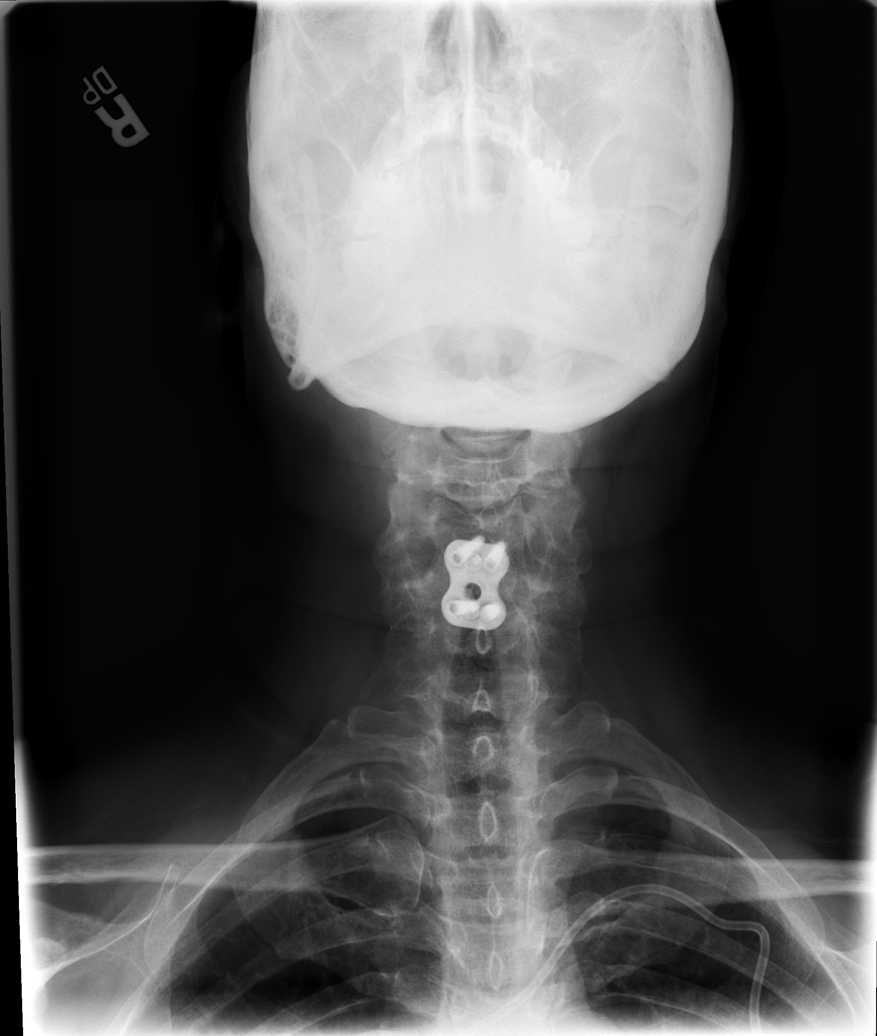

[w c-spine odontoid]
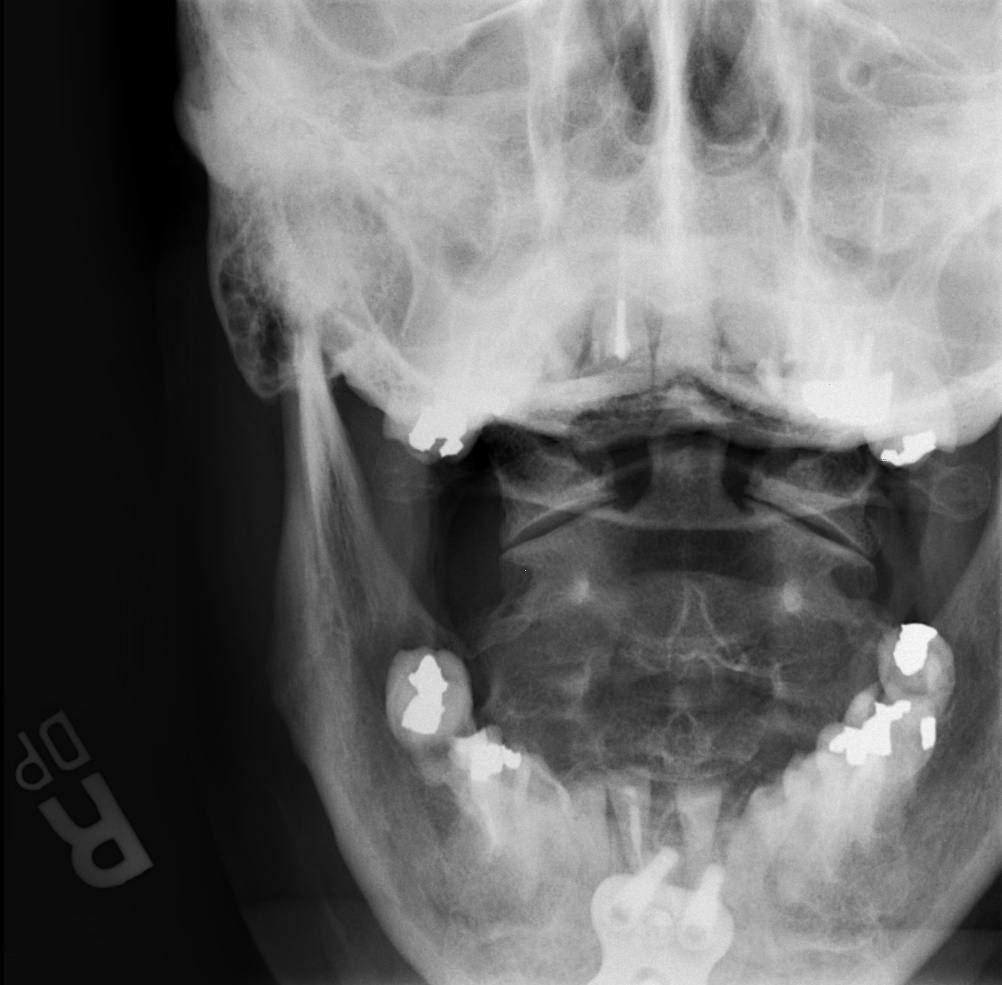

[5 of 5 positions shown; findings below may reference images not displayed]

IMPRESSION: Anterior fusion at C4-5 appears solid.  Normal alignment with diffuse anterior osteophyte formation is noted.

## 2006-12-22 IMAGING — PT NM PET TUM IMG SKULL BASE T - THIGH
5 series · 25 of 25 positions shown · non-contrast
Comparison: Previous PET scan from 03/25/04 revealed no evidence of abnormal FDG activity.

CLINICAL DATA: The patient has a history of right breast cancer with right mastectomy in November 2003.  
 FDG PET-CT TUMOR IMAGING (SKULL BASE TO THIGHS) ? 08/29/04: 
 Fasting Blood Glucose:  164.
TECHNIQUE: 14.7 mCi F18-FDG were administered via the left antecubital fossa.  Full ring PET imaging was performed from the skull base through the mid-thighs 74 minutes after injection.  CT data was obtained and used for attenuation correction and anatomic localization only.  (This was not acquired as a diagnostic CT examination).

[Series 1: pet ac · axial · 3.3mm · 4.69mm/px · z∈[-872,-2]mm · 7 of 267 slices shown]
[im 1/267]
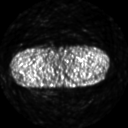
[im 45/267]
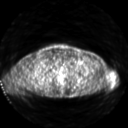
[im 89/267]
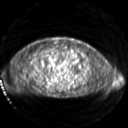
[im 134/267]
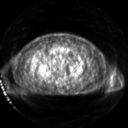
[im 178/267]
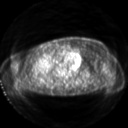
[im 222/267]
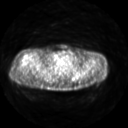
[im 267/267]
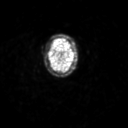

[Series 2: pet nac · axial · 3.3mm · 4.69mm/px · z∈[-872,-2]mm · 8 of 267 slices shown]
[im 1/267]
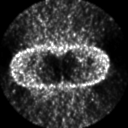
[im 39/267]
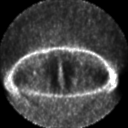
[im 77/267]
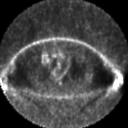
[im 115/267]
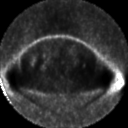
[im 153/267]
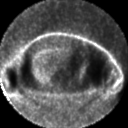
[im 191/267]
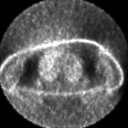
[im 229/267]
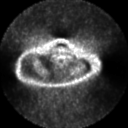
[im 267/267]
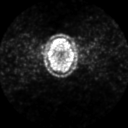

[Series 2: ct images · axial · 3.8mm · 0.98mm/px · z∈[-872,-2]mm · 8 of 267 slices shown]
[im 1/267]
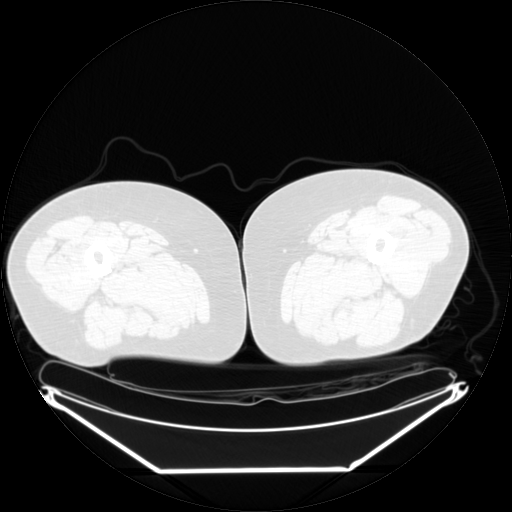
[im 39/267]
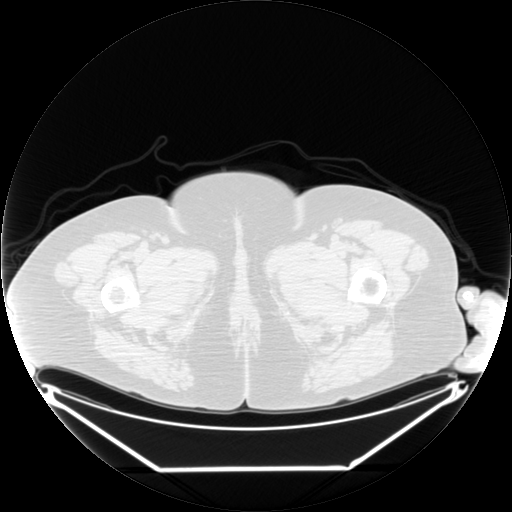
[im 77/267]
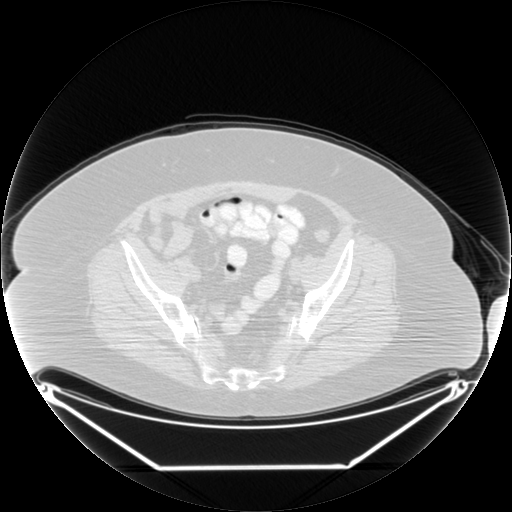
[im 115/267]
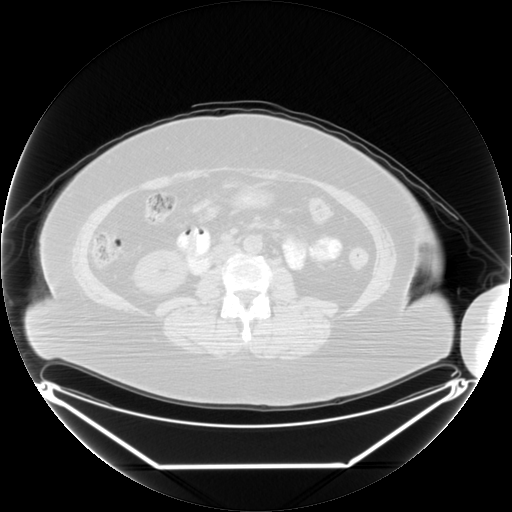
[im 153/267]
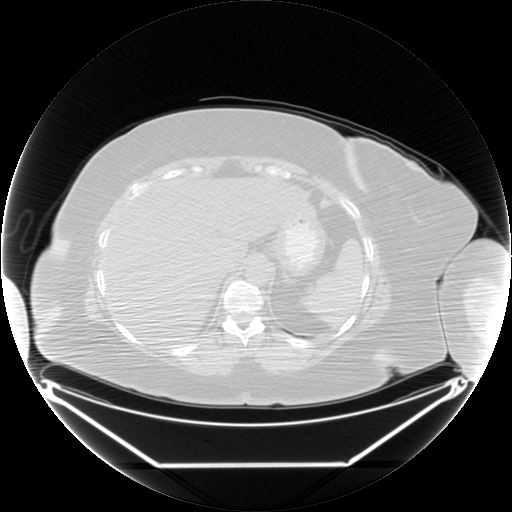
[im 191/267]
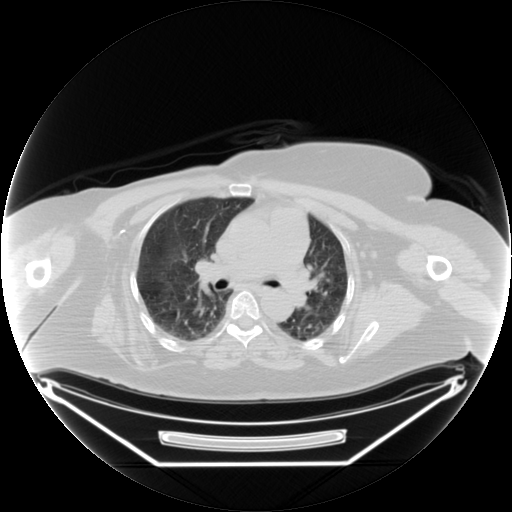
[im 229/267]
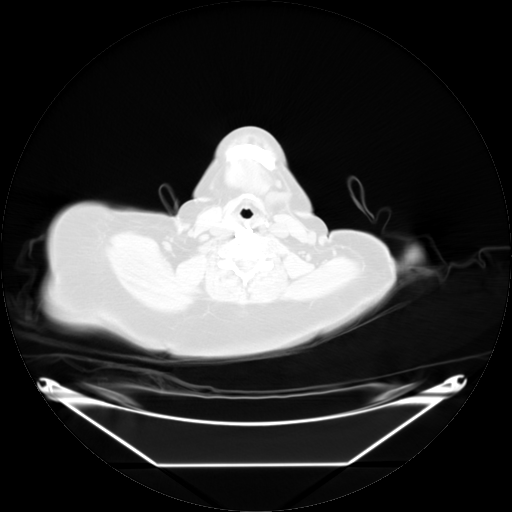
[im 267/267  brain]
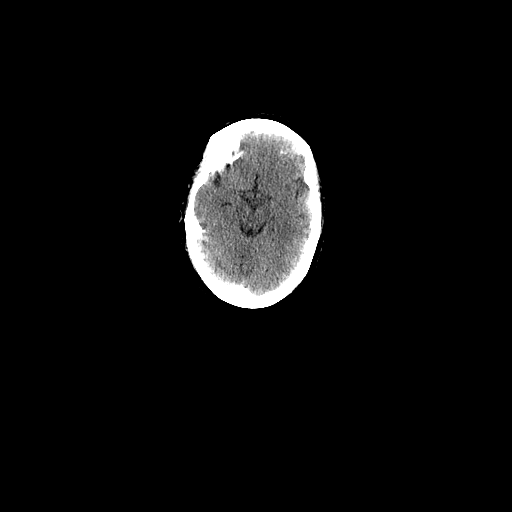

[Series 123: mip · coronal · 3.3mm · 4.69mm/px · 1 of 30 slices shown]
[im 1/30]
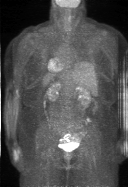

[Series 250: reformatted · axial · 3.8mm · 0.98mm/px · 1 of 1 slices shown]
[im 1/1]
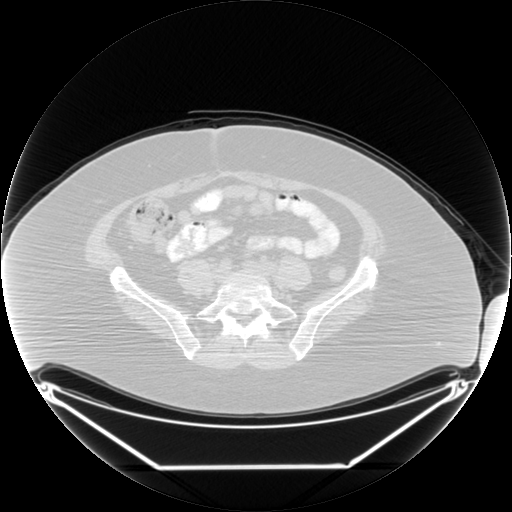

[25 of 25 positions shown; findings below may reference images not displayed]

FINDINGS: No abnormal activity is noted in the neck, chest, abdomen, or pelvis.
IMPRESSION: Stable PET scan.  No areas of abnormal FDG activity.
 There is metabolic activity noted in the gastrointestinal tract.  There is a focal area of prominent activity in the region of the cecum.  This is thought to be within normal limits however, follow-up CT scan and PET scan would be suggested.

## 2006-12-22 IMAGING — CT CT PELVIS W/ CM
1 of 4 series · 13 of 32 positions shown, 19 images · IV contrast (omnipaque)
Comparison: none

CLINICAL DATA: Breast cancer. 
 CT CHEST, ABDOMEN AND PELVIS WITH CONTRAST:
TECHNIQUE: 125 cc of Omnipaque 300.
 Comparison Chest, 03/24/04, abdomen 04/21/04. 
 CT CHEST:
 The right lobe of the thyroid gland is absent.  Postoperative changes in the right axilla and right breast region are noted.  
 A left subclavian port-a-cath device has been placed.  The tip is in the left brachiocephalic vein. Negative abnormal mediastinal or hilar adenopathy.  Subcentimeter short axis diameter nodes are noted.  
 No pneumothoraces or effusions are seen.  
 Mild dependent atelectasis is seen bilaterally.  No pulmonary nodules or masses are seen.

[Series 2: cap 5.0 b40s st · axial · 0.73mm/px · z∈[-584,-64]mm · 13 of 120 slices shown, 19 images]
[im 8/120  soft-tissue]
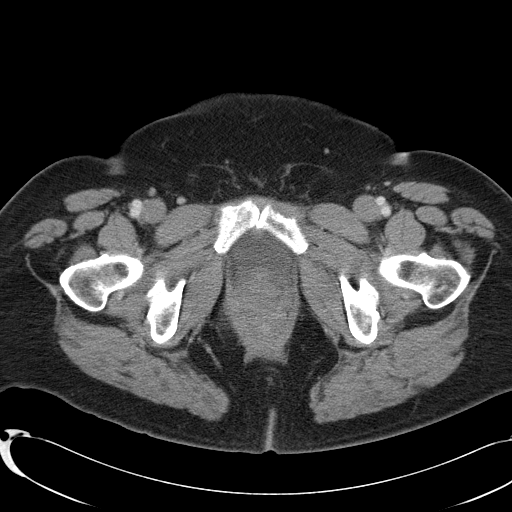
[im 8/120  bone]
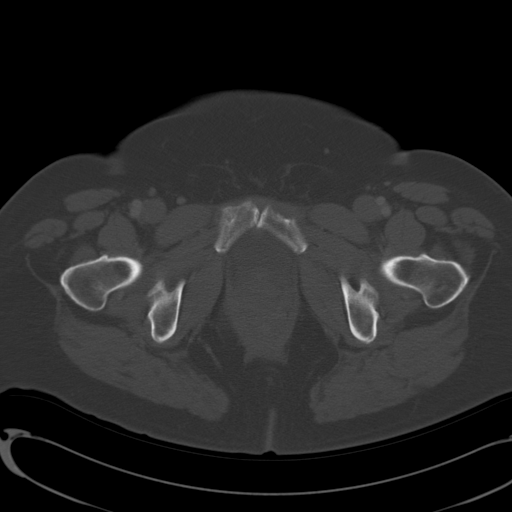
[im 16/120  soft-tissue]
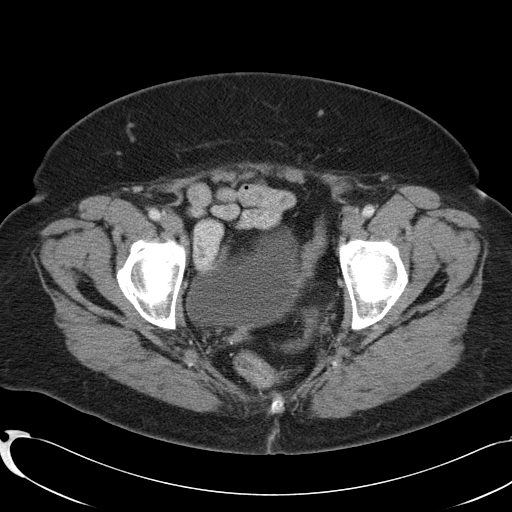
[im 24/120  soft-tissue]
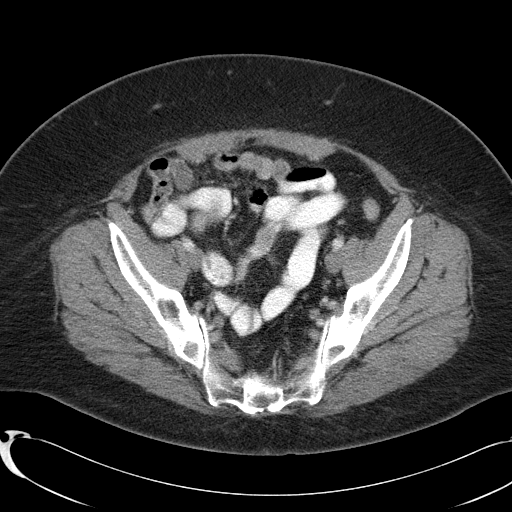
[im 32/120  soft-tissue]
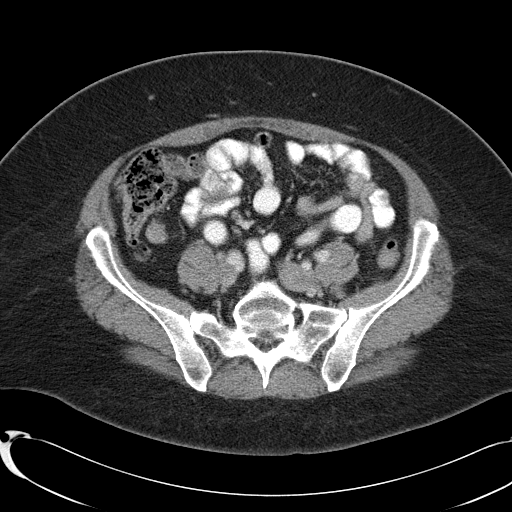
[im 40/120  soft-tissue]
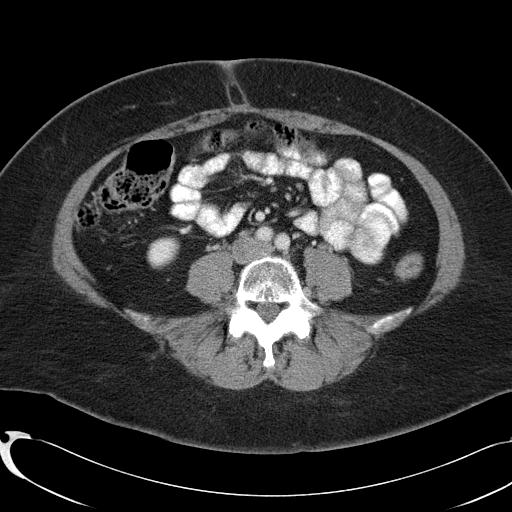
[im 48/120  soft-tissue]
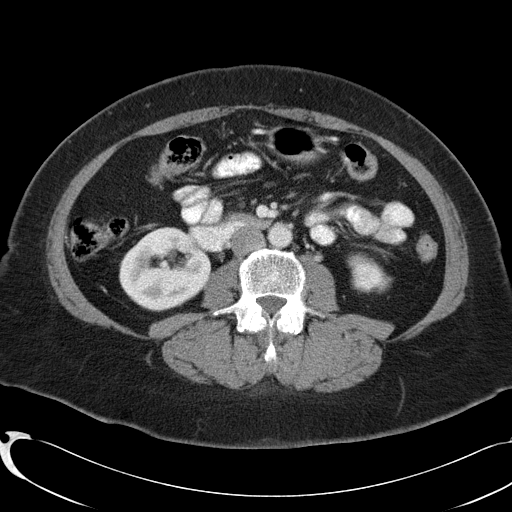
[im 64/120  soft-tissue]
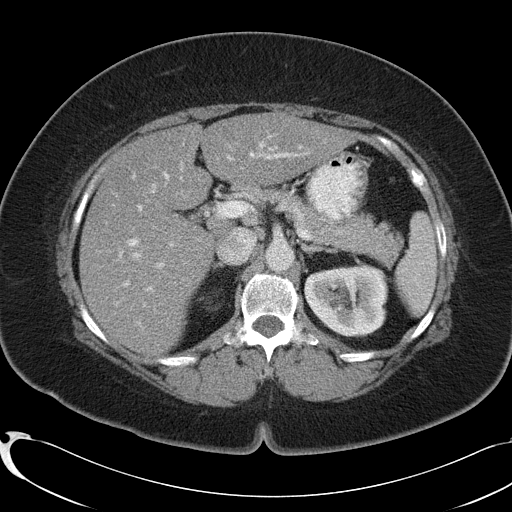
[im 72/120  soft-tissue]
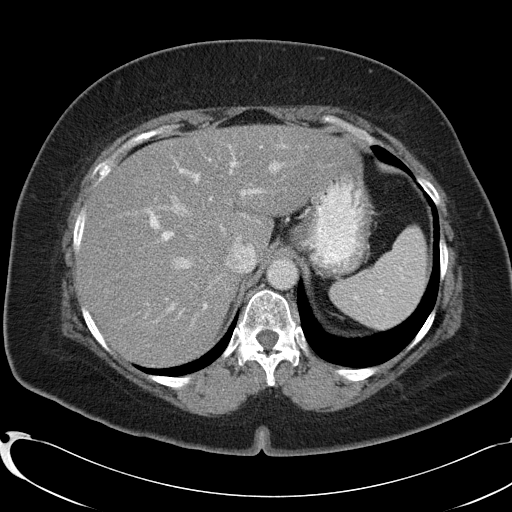
[im 80/120  soft-tissue]
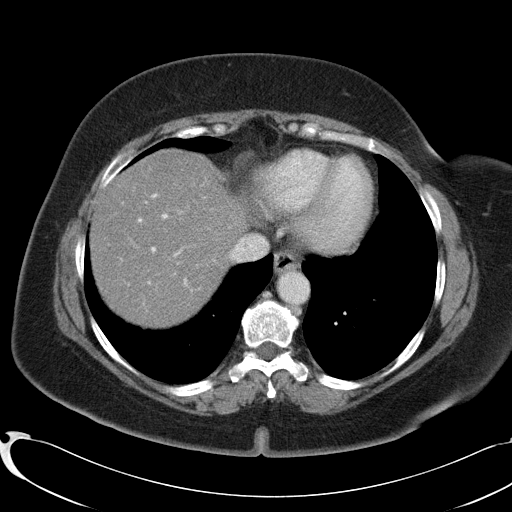
[im 80/120  bone]
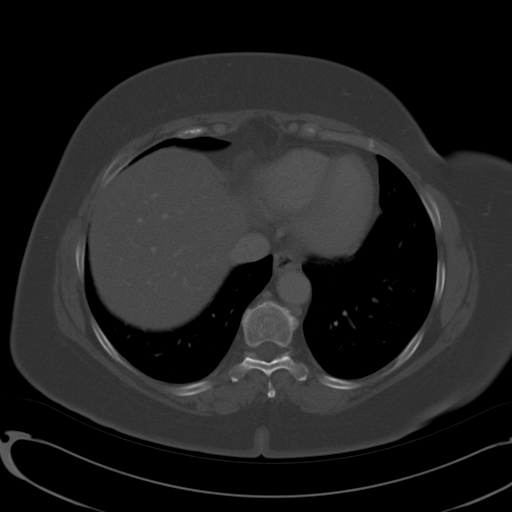
[im 88/120  soft-tissue]
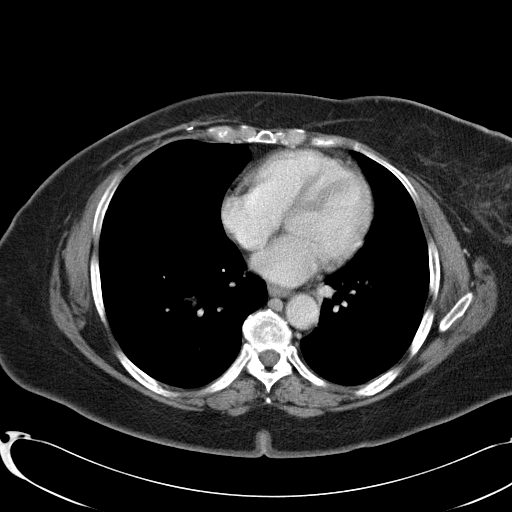
[im 88/120  lung]
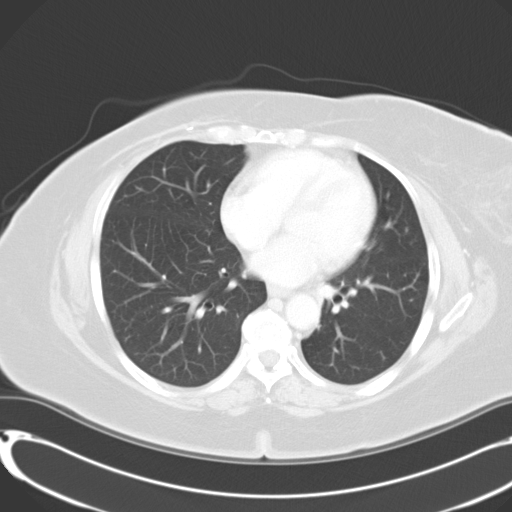
[im 96/120  soft-tissue]
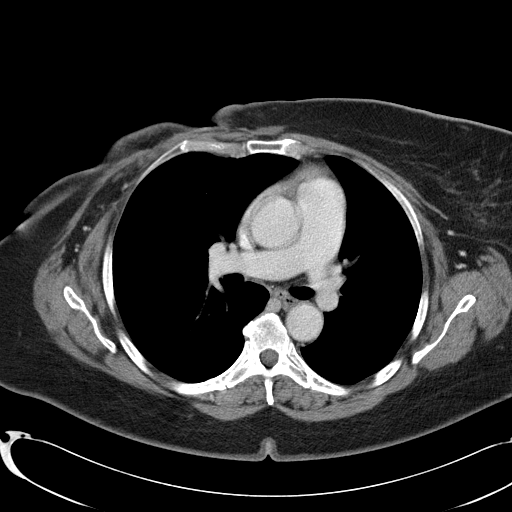
[im 96/120  lung]
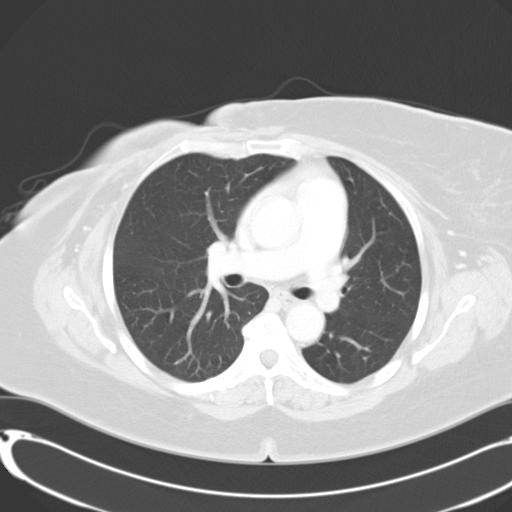
[im 104/120  soft-tissue]
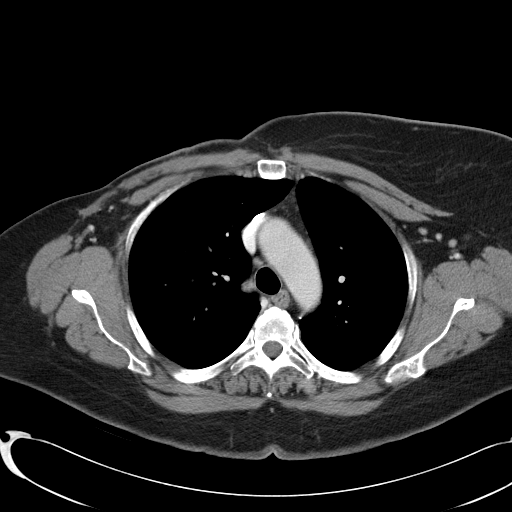
[im 104/120  lung]
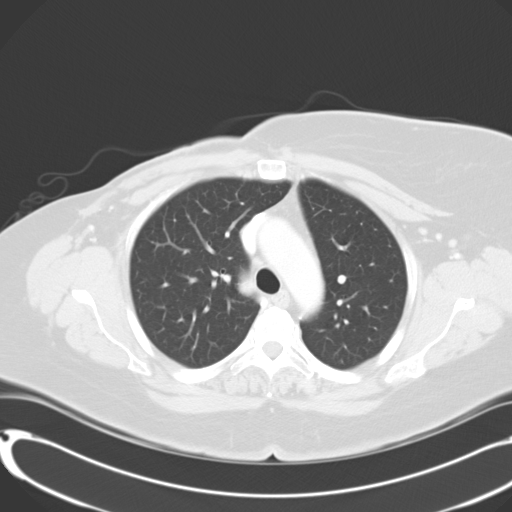
[im 112/120  soft-tissue]
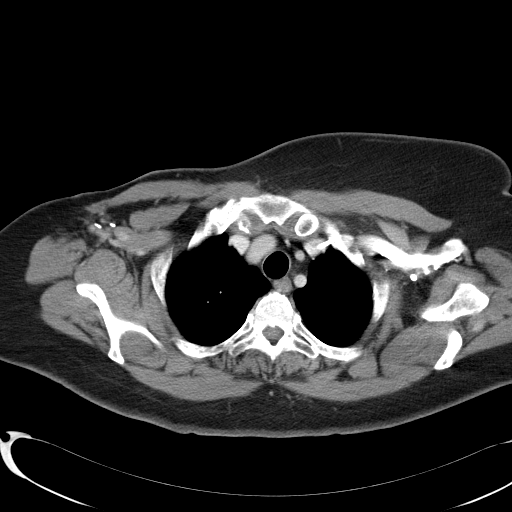
[im 112/120  lung]
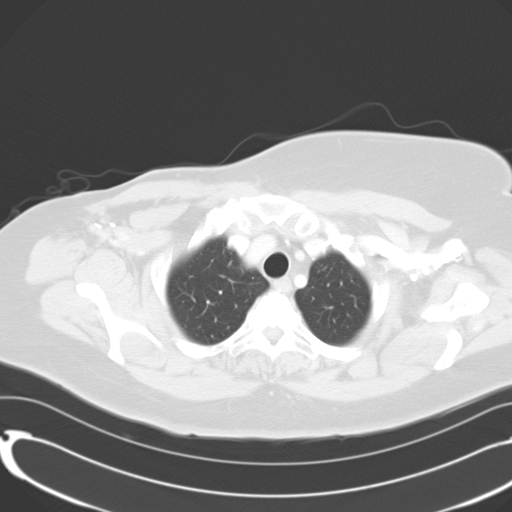

[13 of 32 positions shown; findings below may reference images not displayed]

IMPRESSION: No evidence of metastatic disease. 
 CT ABDOMEN:
 Diffuse fatty infiltration of the liver is present without focal lesion.  The gallbladder, spleen, pancreas, adrenal glands are within normal limits.  Small peripelvic cysts are seen in both kidneys centrally. Negative free fluid.  Negative abnormal adenopathy.  A small umbilical hernia containing only fat is seen.  Non-obstructing calculi in the left kidney are stable.
IMPRESSION: 1.  No evidence of metastatic disease. 
 2.  Nephrolithiasis. 
 3.  Small periumbilical hernia. 
 CT PELVIS:
 The bladder is within normal limits.  The uterus is absent.  Negative free fluid.   Negative abnormal adenopathy.
IMPRESSION: No evidence of metastatic disease.

## 2007-02-10 IMAGING — US US MISC SOFT TISSUE
1 series · 14 of 16 positions shown · non-contrast
Comparison: none

CLINICAL DATA: The patient has a palpable mass in the right axilla.  
 LIMITED ULTRASOUND:
 One of the small palpable masses corresponds to a 13.3 x 3.5 x 6.5 mm relatively sonolucent area thought to represent a lymph node.  The second small palpable area corresponds to a 4 x 4 x 3 mm probable lymph node.

[Series 1: unknown · 0.09mm/px · 14 of 16 slices shown]
[im 1/16]
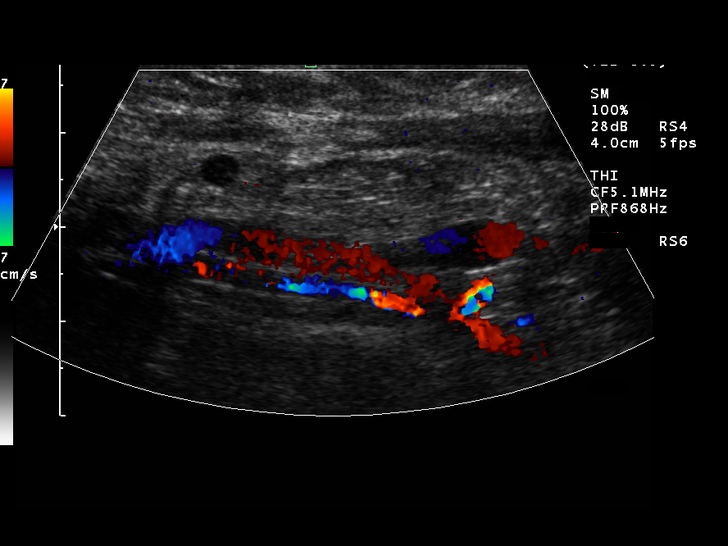
[im 2/16]
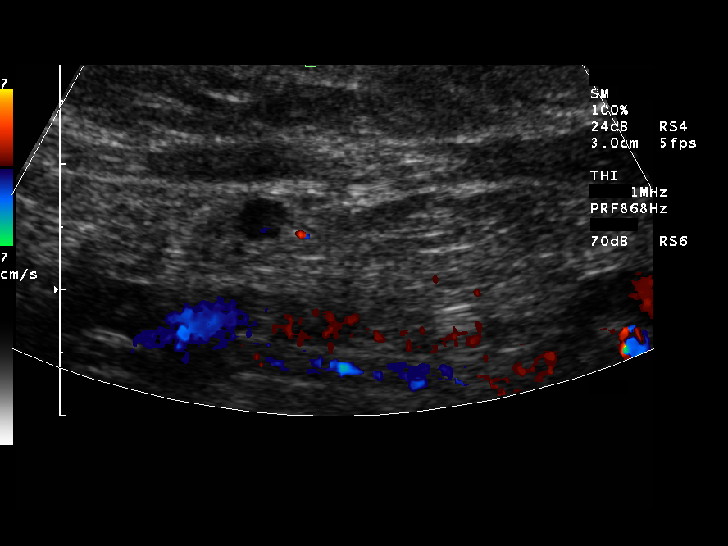
[im 3/16]
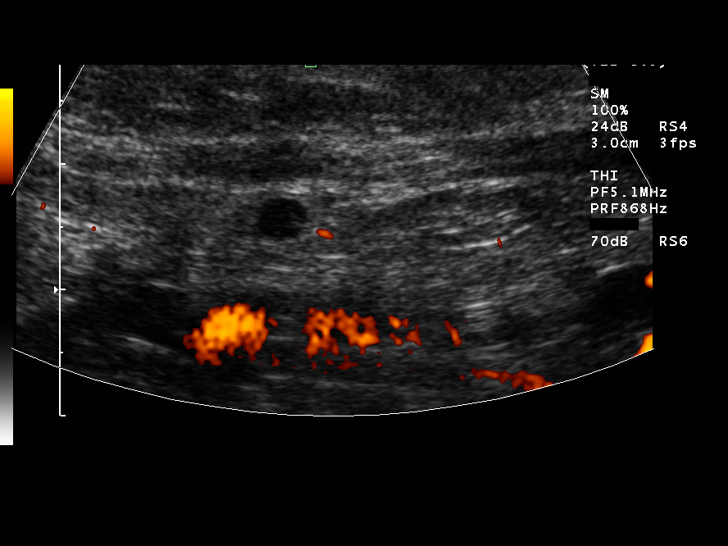
[im 5/16]
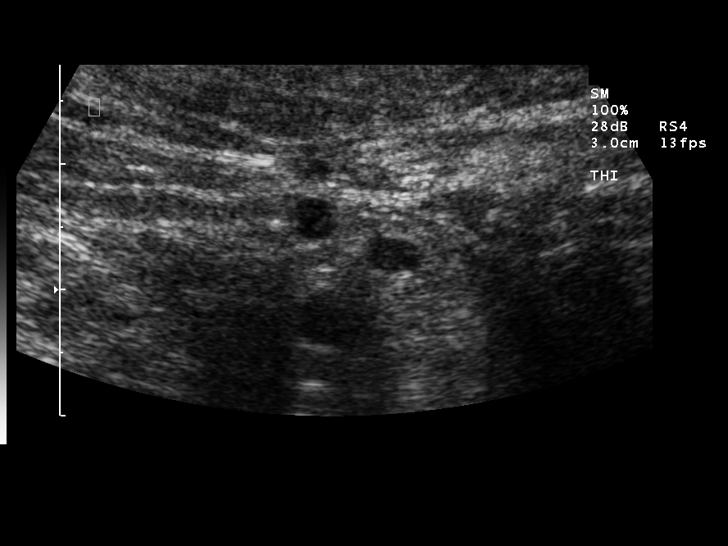
[im 6/16]
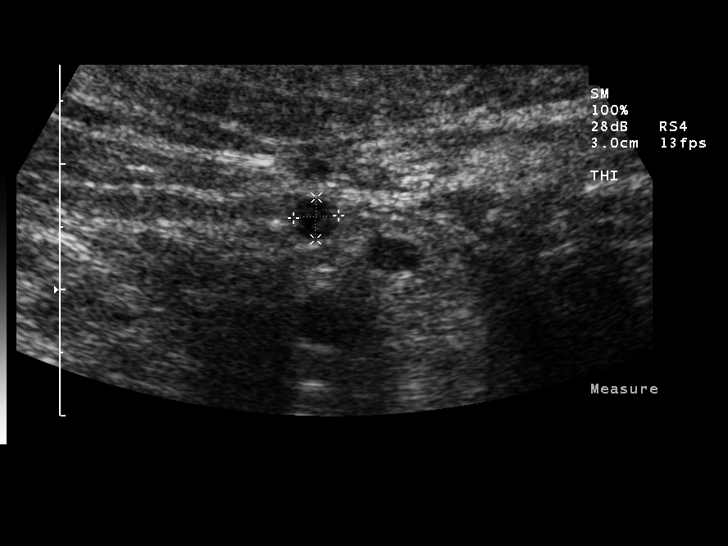
[im 7/16]
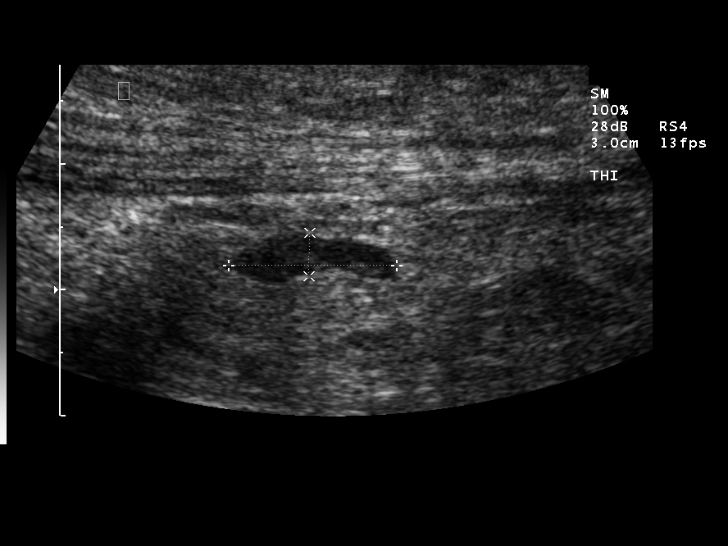
[im 8/16]
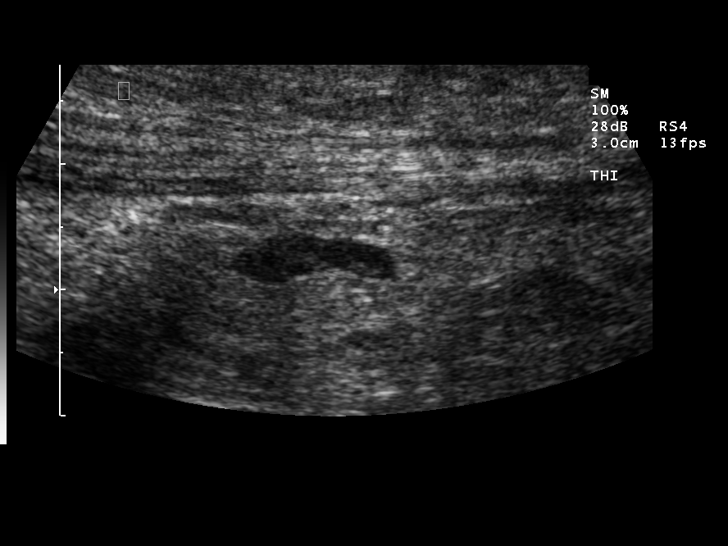
[im 9/16]
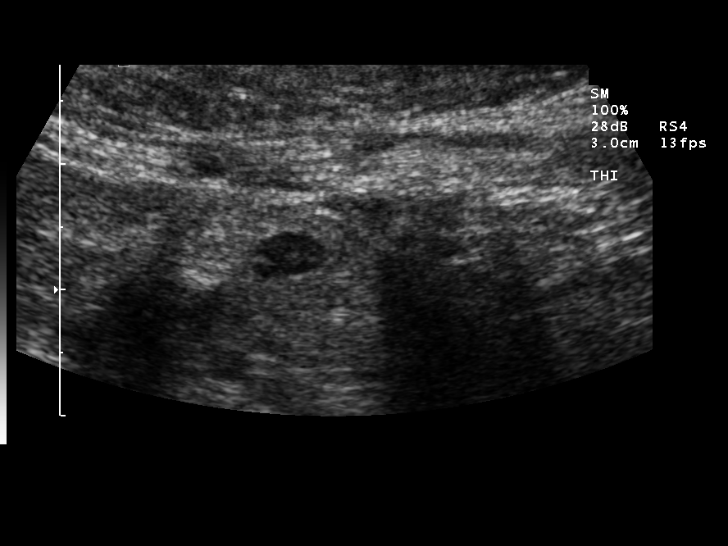
[im 10/16]
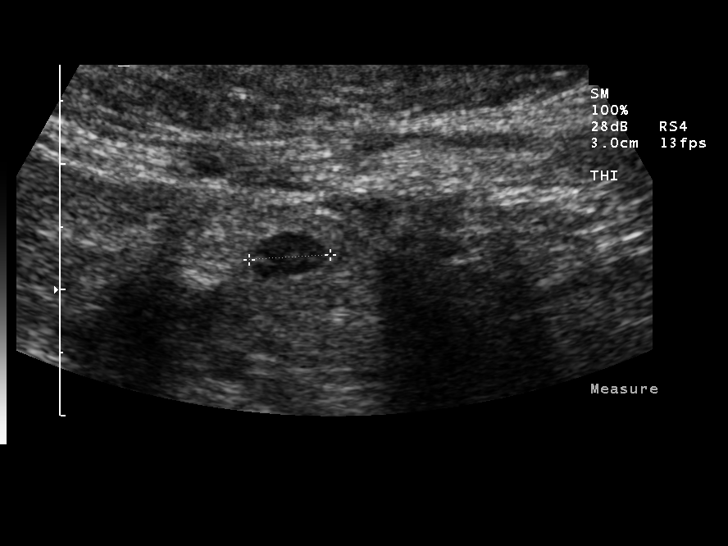
[im 11/16]
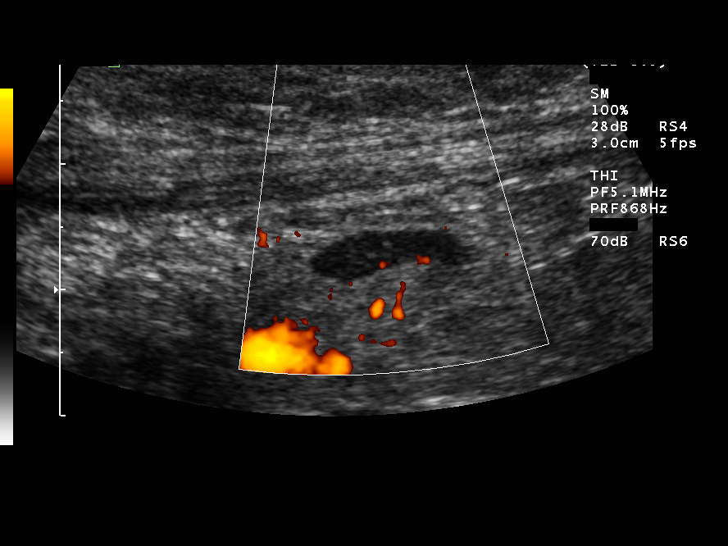
[im 13/16]
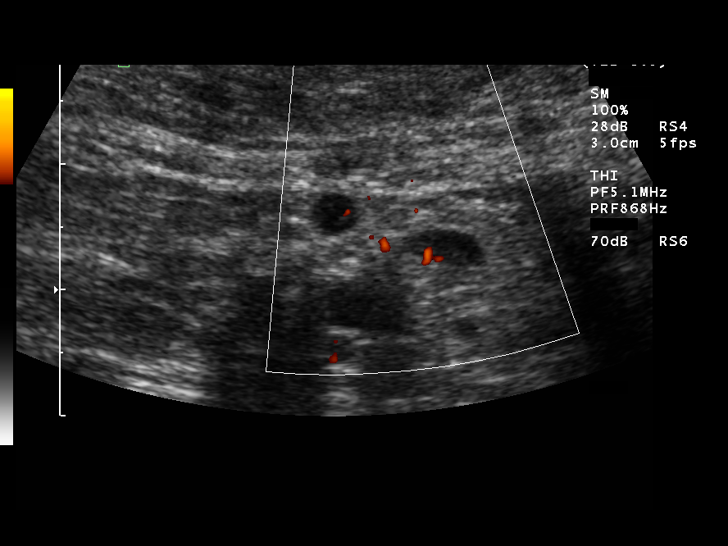
[im 14/16]
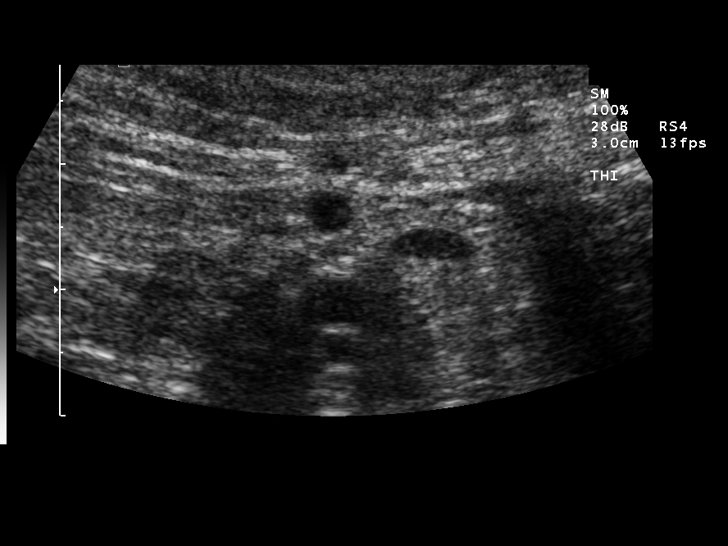
[im 15/16]
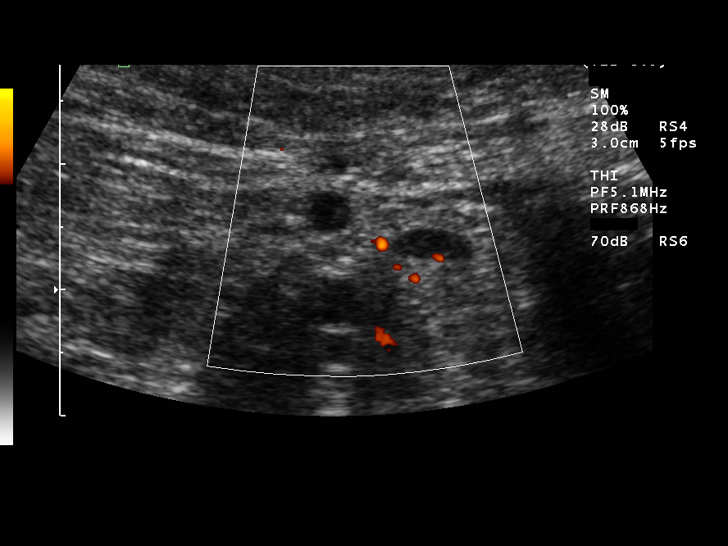
[im 16/16]
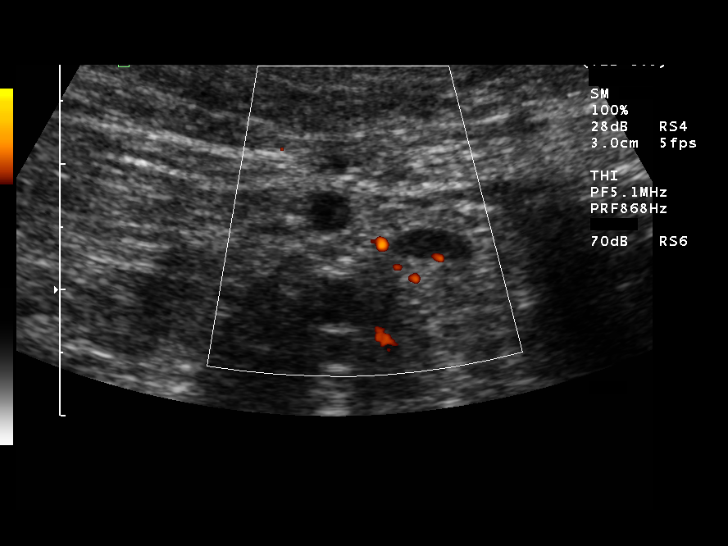

[14 of 16 positions shown; findings below may reference images not displayed]

IMPRESSION: Two small palpable areas in the right axilla are thought to represent small lymph nodes.

## 2007-02-11 ENCOUNTER — Emergency Department (HOSPITAL_COMMUNITY): Admission: EM | Admit: 2007-02-11 | Discharge: 2007-02-11 | Payer: Self-pay | Admitting: Emergency Medicine

## 2007-02-23 ENCOUNTER — Ambulatory Visit: Payer: Self-pay | Admitting: Family Medicine

## 2007-04-11 ENCOUNTER — Ambulatory Visit: Payer: Self-pay | Admitting: Oncology

## 2007-04-16 LAB — CBC WITH DIFFERENTIAL (CANCER CENTER ONLY)
BASO#: 0 10*3/uL (ref 0.0–0.2)
BASO%: 0.5 % (ref 0.0–2.0)
EOS%: 3 % (ref 0.0–7.0)
HCT: 44.3 % (ref 34.8–46.6)
HGB: 14.6 g/dL (ref 11.6–15.9)
LYMPH#: 2.4 10*3/uL (ref 0.9–3.3)
MCH: 28.6 pg (ref 26.0–34.0)
MCHC: 32.9 g/dL (ref 32.0–36.0)
MONO%: 6.4 % (ref 0.0–13.0)
NEUT%: 45.7 % (ref 39.6–80.0)
RDW: 12.8 % (ref 10.5–14.6)

## 2007-04-16 LAB — COMPREHENSIVE METABOLIC PANEL
ALT: 22 U/L (ref 0–35)
AST: 21 U/L (ref 0–37)
CO2: 26 mEq/L (ref 19–32)
Calcium: 10.3 mg/dL (ref 8.4–10.5)
Chloride: 104 mEq/L (ref 96–112)
Creatinine, Ser: 0.77 mg/dL (ref 0.40–1.20)
Sodium: 140 mEq/L (ref 135–145)
Total Protein: 7.5 g/dL (ref 6.0–8.3)

## 2007-04-18 ENCOUNTER — Ambulatory Visit: Payer: Self-pay | Admitting: Oncology

## 2007-04-18 ENCOUNTER — Ambulatory Visit (HOSPITAL_COMMUNITY): Admission: RE | Admit: 2007-04-18 | Discharge: 2007-04-18 | Payer: Self-pay | Admitting: Oncology

## 2007-04-18 ENCOUNTER — Encounter (INDEPENDENT_AMBULATORY_CARE_PROVIDER_SITE_OTHER): Payer: Self-pay | Admitting: *Deleted

## 2007-04-22 IMAGING — CT CT ABDOMEN W/O CM
1 of 2 series · 14 of 32 positions shown, 19 images · IV contrast (agent unspecified)
Comparison: 08/30/2004.

CLINICAL DATA: Breast cancer.    
 CHEST CT WITHOUT CONTRAST:
TECHNIQUE: Multidetector CT imaging of the chest was performed following the standard protocol without IV contrast.
TECHNIQUE: Multidetector CT imaging of the abdomen was performed following the standard protocol without IV contrast. 
 Images of the upper abdomen are limited by noncontrast technique. Within these limitations, the liver, spleen, adrenal glands and pancreas are all unremarkable.  The right kidney is negative.  Within the left renal collecting are multiple small stones measuring up to 6 mm.  No hydronephrosis is noted.  
 No pathologically enlarged retroperitoneal or mesenteric adenopathy.  The visualized bowel loops are negative.  There is no free fluid.  
 Review of bone windows demonstrates no lytic or sclerotic lesions.
TECHNIQUE: Multidetector CT imaging of the pelvis was performed following the standard protocol without IV contrast. 
 No pelvic lymphadenopathy.  No pelvic free fluid.  No inguinal lymph nodes.  Visualized pelvic bowel loops are negative.  

[Series 3: cap 5.0 b40f st · axial · 0.73mm/px · z∈[-412,+134]mm · 14 of 121 slices shown, 19 images]
[im 6/121  soft-tissue]
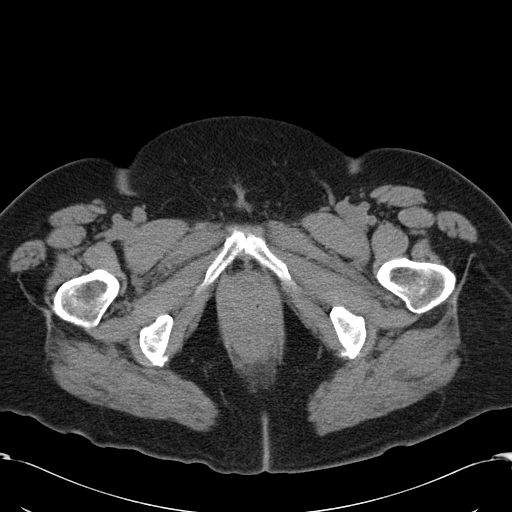
[im 6/121  bone]
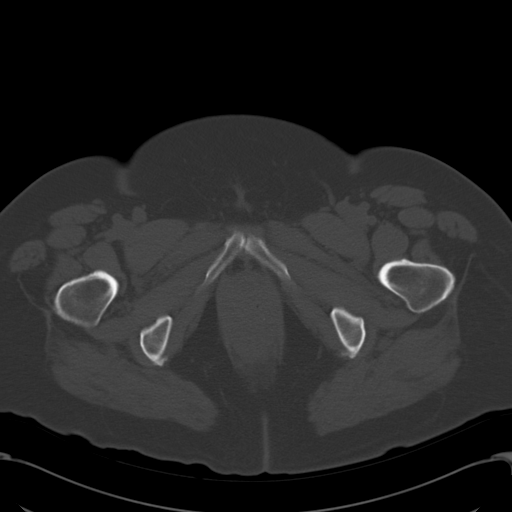
[im 18/121  soft-tissue]
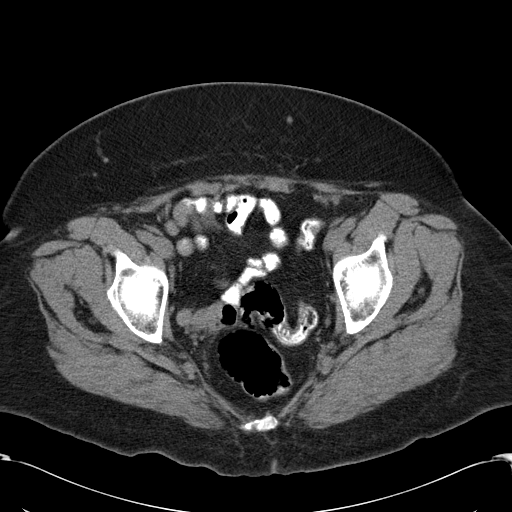
[im 23/121  soft-tissue]
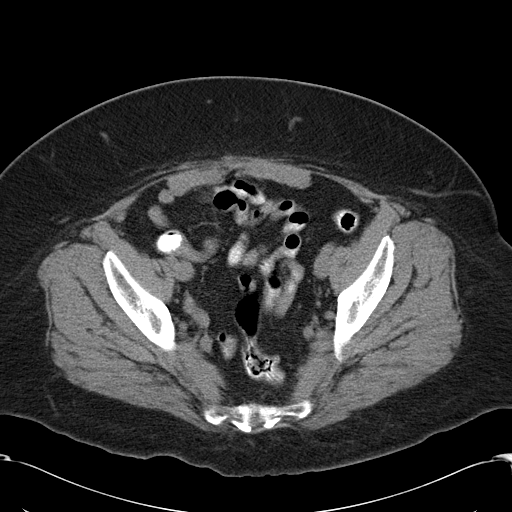
[im 35/121  soft-tissue]
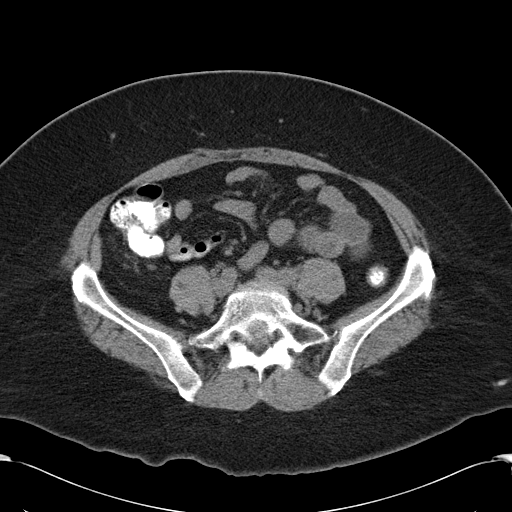
[im 41/121  soft-tissue]
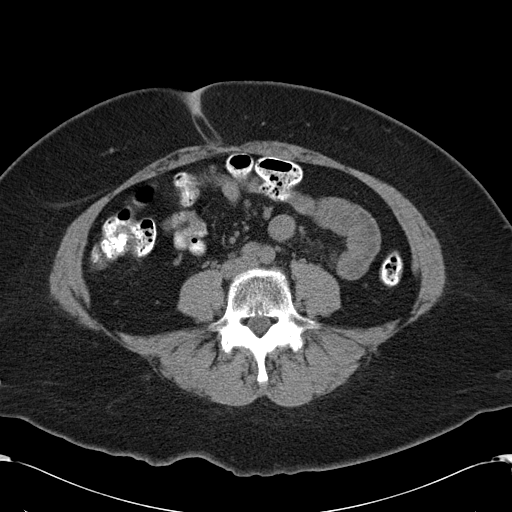
[im 52/121  soft-tissue]
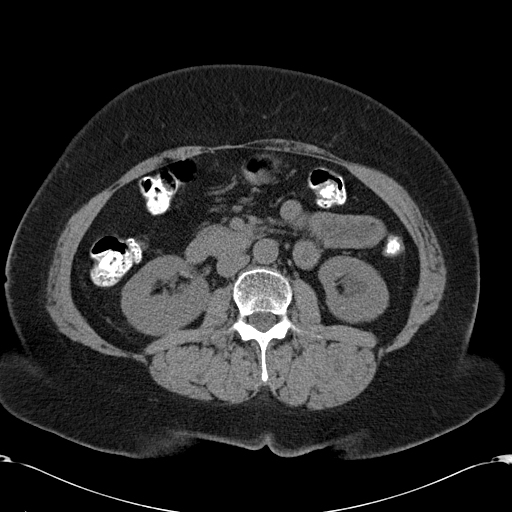
[im 63/121  soft-tissue]
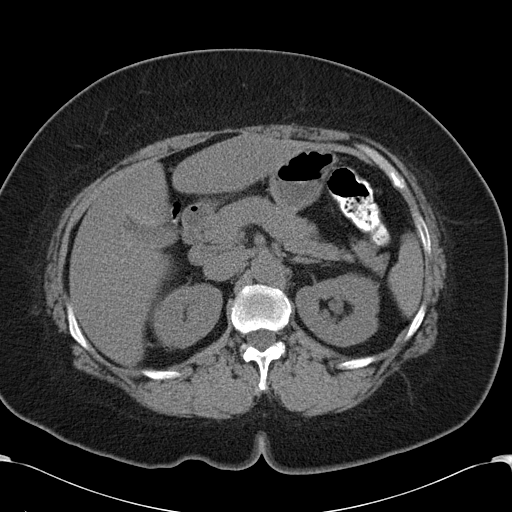
[im 69/121  soft-tissue]
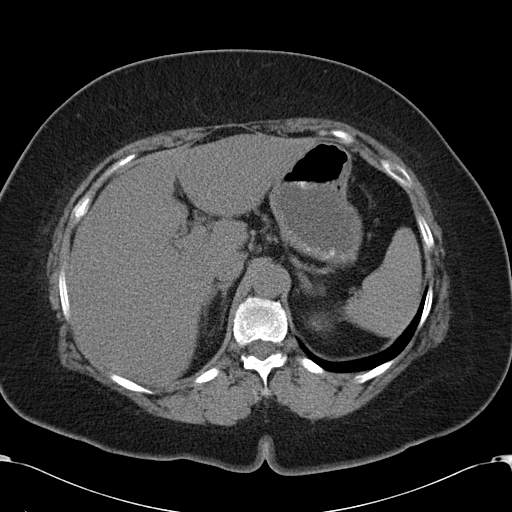
[im 81/121  soft-tissue]
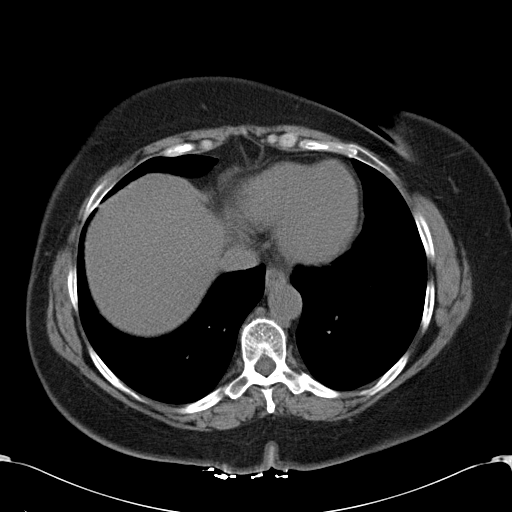
[im 81/121  bone]
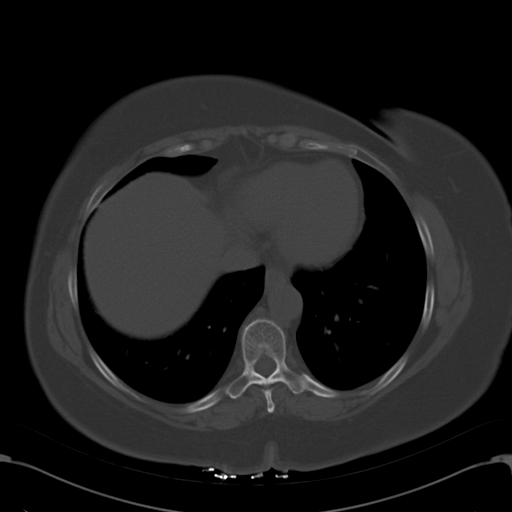
[im 86/121  soft-tissue]
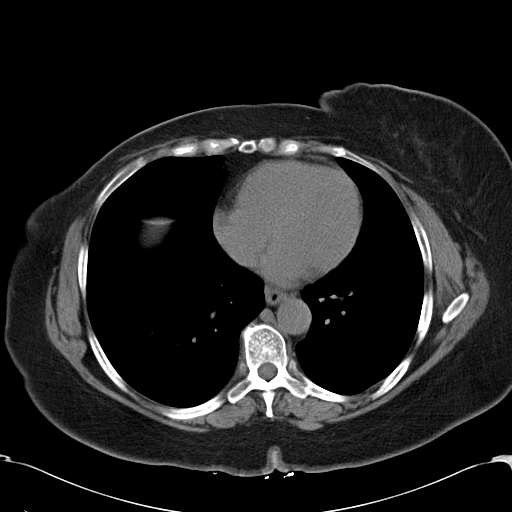
[im 98/121  soft-tissue]
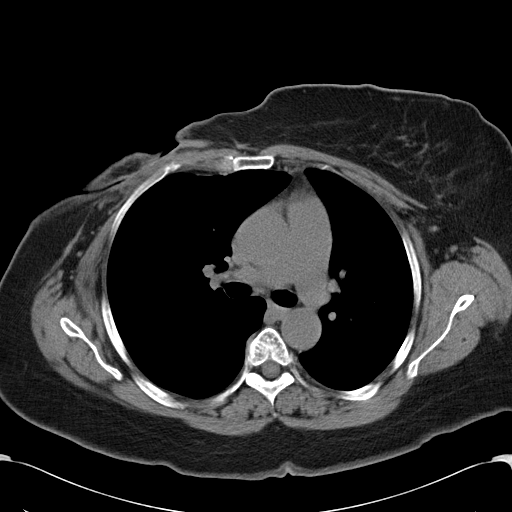
[im 98/121  lung]
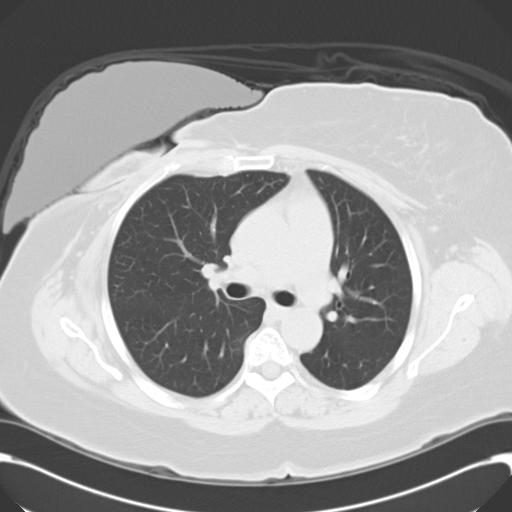
[im 103/121  soft-tissue]
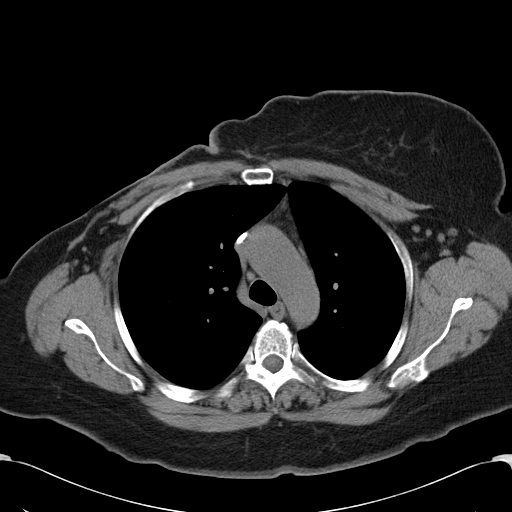
[im 103/121  lung]
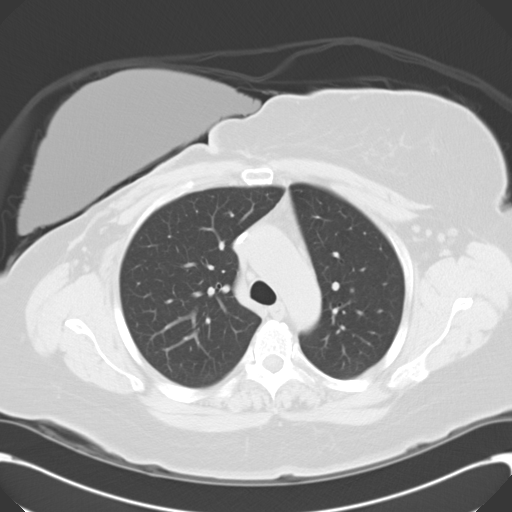
[im 109/121  lung]
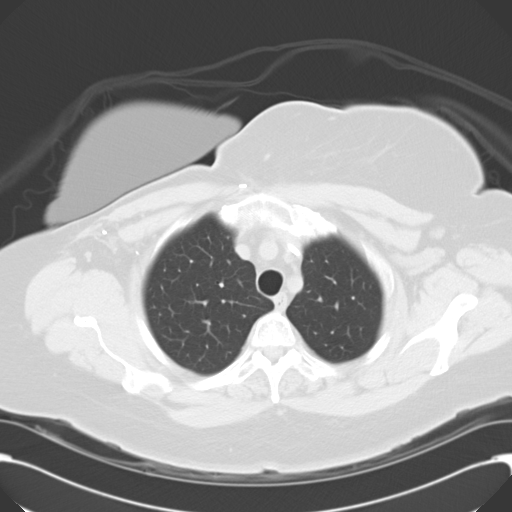
[im 115/121  soft-tissue]
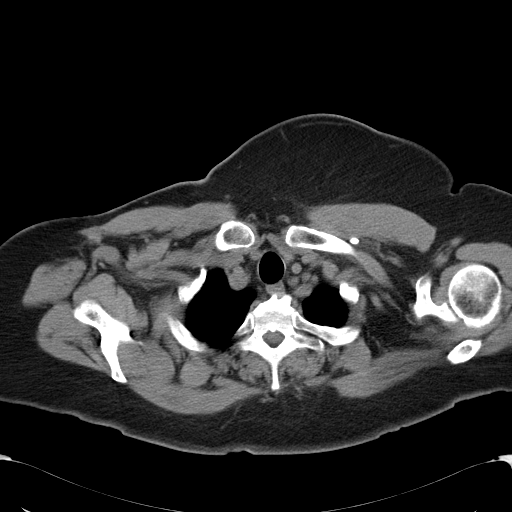
[im 115/121  lung]
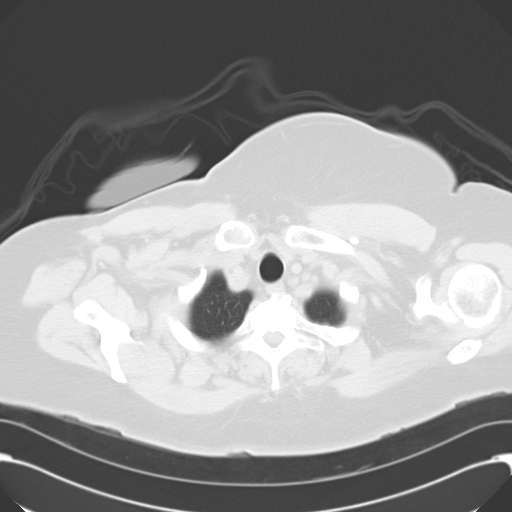

[14 of 32 positions shown; findings below may reference images not displayed]

FINDINGS: No pathologically enlarged axillary lymphadenopathy is noted.  The patient is status post right mastectomy.  
 No hypermetabolic or pathologically enlarged mediastinal or hilar lymphadenopathy.  
 No pleural or pericardial effusions.   
 Tiny nodule within the left upper lobe less than 5 mm (image 19) is stable.
IMPRESSION: 1.  No evidence for residual or recurrent tumor.
 2.  Nonspecific less than 5 mm nodule within the left upper lobe.  Attention on followup recommended.    
 ABDOMEN CT WITHOUT CONTRAST:
IMPRESSION: No evidence for abdominal metastasis. 
 PELVIS CT WITHOUT CONTRAST:
IMPRESSION: No evidence for pelvic metastasis.

## 2007-04-22 IMAGING — PT NM PET TUM IMG SKULL BASE T - THIGH
4 series · 25 of 25 positions shown · non-contrast
Comparison: 

CLINICAL DATA: FDG PET-CT TUMOR IMAGING (SKULL BASE TO THIGHS) ? 12/29/04: 
Fasting Blood Glucose:
TECHNIQUE: --- mCi F18-FDG were administered via --.  Full ring PET imaging was performed from the skull base through the mid-thighs -- minutes after injection.  CT data was obtained and used for attenuation correction and anatomic localization only.  (This was not acquired as a diagnostic CT examination.)

[Series 1: pet ac · axial · 3.3mm · 4.69mm/px · z∈[-870,+0]mm · 8 of 267 slices shown]
[im 1/267]
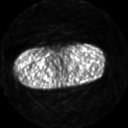
[im 39/267]
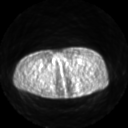
[im 77/267]
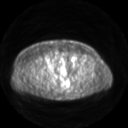
[im 115/267]
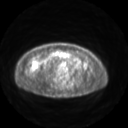
[im 153/267]
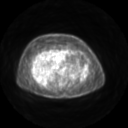
[im 191/267]
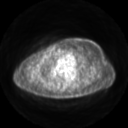
[im 229/267]
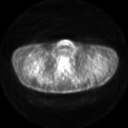
[im 267/267]
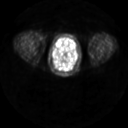

[Series 2: ct images · axial · 3.8mm · 0.98mm/px · z∈[-870,+0]mm · 8 of 264 slices shown]
[im 1/264]
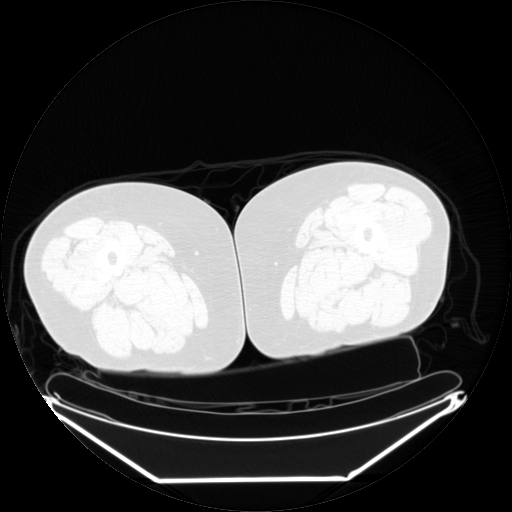
[im 38/264]
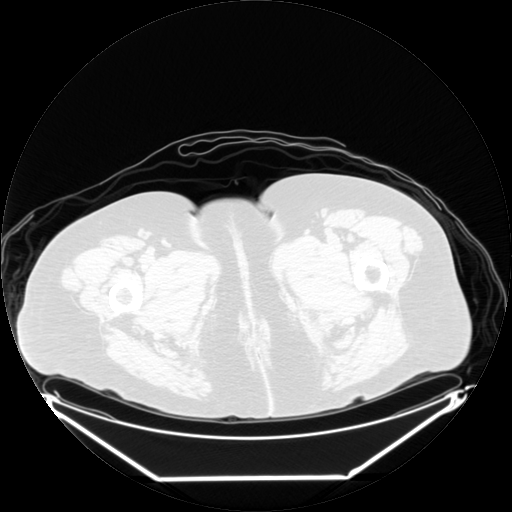
[im 76/264]
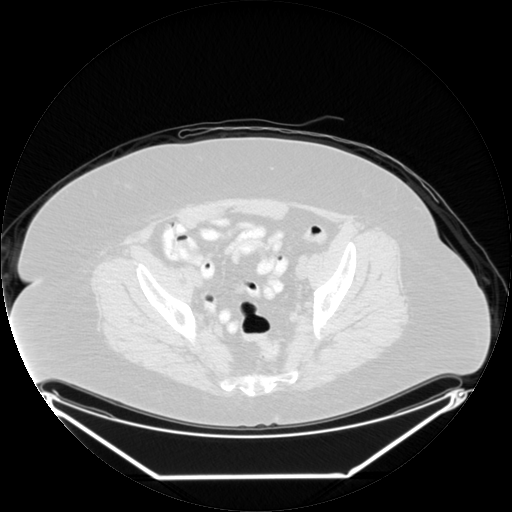
[im 113/264]
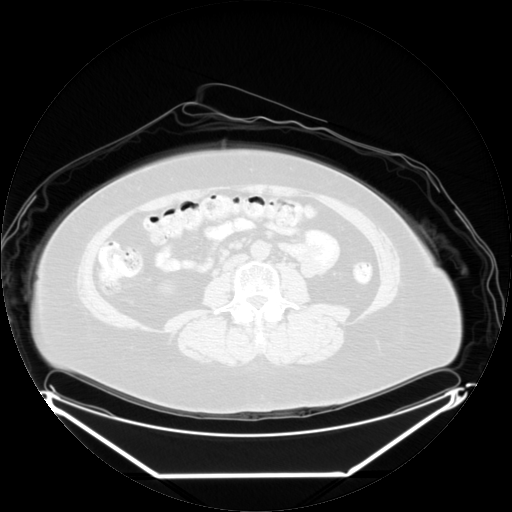
[im 151/264]
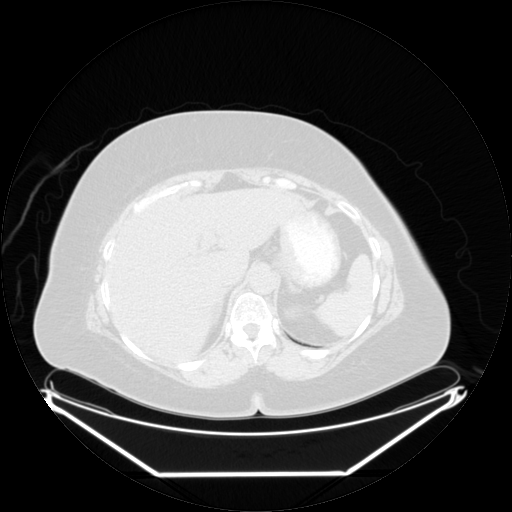
[im 188/264]
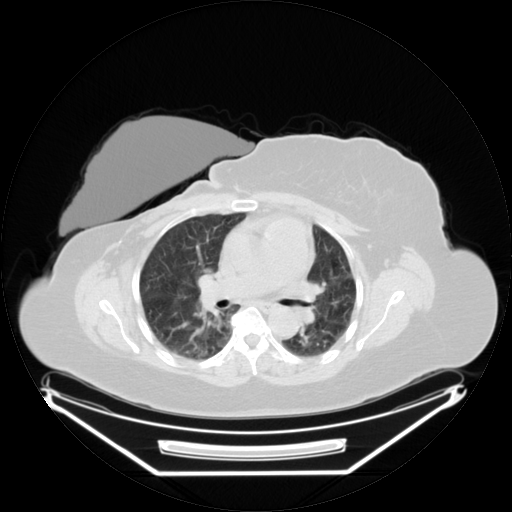
[im 226/264]
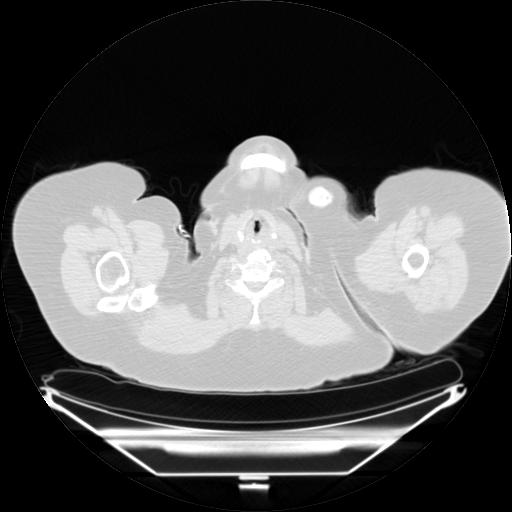
[im 264/264  brain]
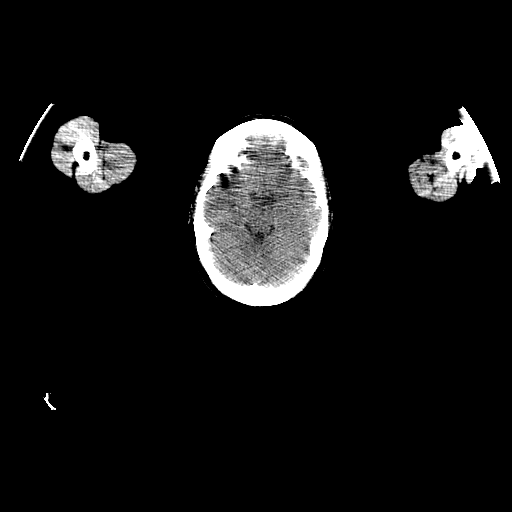

[Series 2: pet nac · axial · 3.3mm · 4.69mm/px · z∈[-870,+0]mm · 8 of 267 slices shown]
[im 1/267]
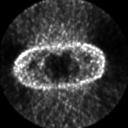
[im 39/267]
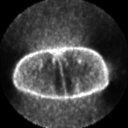
[im 77/267]
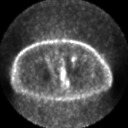
[im 115/267]
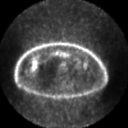
[im 153/267]
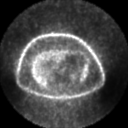
[im 191/267]
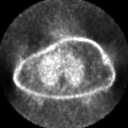
[im 229/267]
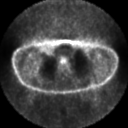
[im 267/267]
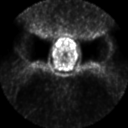

[Series 123: mip · coronal · 3.3mm · 4.69mm/px · 1 of 30 slices shown]
[im 1/30]
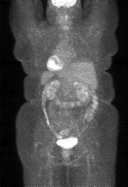

[25 of 25 positions shown; findings below may reference images not displayed]

FINDINGS: No hypermetabolic or pathologically enlarged lymph nodes within the soft tissues of the neck.
No hypermetabolic or pathologically enlarged axillary lymphadenopathy. 
Postsurgical changes from right mastectomy are identified.  
No hypermetabolic mediastinal or hilar lymphadenopathy.  There is no pleural effusion but a small pericardial effusion is noted.  
No hypermetabolic nodules or masses are seen within the lungs.  
No abnormal foci of increased radiotracer uptake are seen within the liver.
No hypermetabolic or pathologically enlarged retroperitoneal or mesenteric lymphadenopathy is identified.  No hypermetabolic or pathologically enlarged pelvic or inguinal lymphadenopathy.
IMPRESSION: No evidence for hypermetabolic residual or recurrent tumor.

## 2007-04-26 ENCOUNTER — Telehealth (INDEPENDENT_AMBULATORY_CARE_PROVIDER_SITE_OTHER): Payer: Self-pay | Admitting: *Deleted

## 2007-04-27 ENCOUNTER — Ambulatory Visit: Payer: Self-pay | Admitting: Cardiology

## 2007-04-27 ENCOUNTER — Ambulatory Visit: Payer: Self-pay | Admitting: Family Medicine

## 2007-04-27 LAB — CONVERTED CEMR LAB
Bilirubin Urine: NEGATIVE
Casts: 0 /lpf
Nitrite: NEGATIVE
Protein, U semiquant: 30
Specific Gravity, Urine: >=1.03
Urine crystals, microscopic: 0 /hpf
WBC, UA: 1 cells/hpf
Yeast, UA: 0

## 2007-04-30 ENCOUNTER — Encounter: Payer: Self-pay | Admitting: Family Medicine

## 2007-05-31 ENCOUNTER — Encounter: Payer: Self-pay | Admitting: Family Medicine

## 2007-07-10 IMAGING — CT NM PET TUM IMG SKULL BASE T - THIGH
1 of 4 series · 2 of 25 positions shown · IV contrast ([ID])
Comparison: 12/29/04.

CLINICAL DATA: Metastatic right breast carcinoma.  Previous chemotherapy.  Left lung nodule seen on chest CT.  
FDG PET-CT TUMOR IMAGING (SKULL BASE TO THIGHS):
Fasting Blood Glucose:  147
TECHNIQUE: 16.2 mCi F-18 FDG were administered via right arm.  Full ring PET imaging was performed from the skull base through the mid-thighs 55 minutes after injection.  CT data was obtained and used for attenuation correction and anatomic localization only.  (This was not acquired as a diagnostic CT examination.)

[Series 2: ct images · axial · 3.8mm · 0.98mm/px · z∈[-124,+0]mm · 2 of 261 slices shown]
[im 223/261  brain]
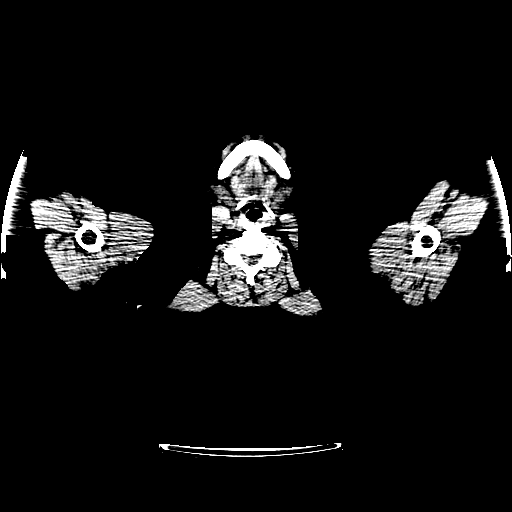
[im 261/261  brain]
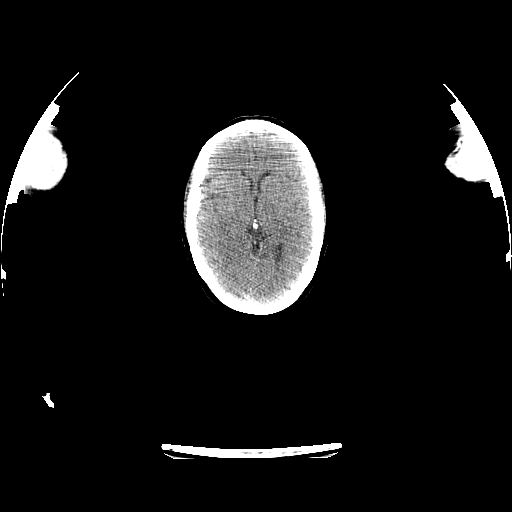

[2 of 25 positions shown; findings below may reference images not displayed]

FINDINGS: No abnormal sites of FDG uptake are identified within the neck, chest, abdomen or pelvis to suggest the presence of metastatic disease.  Post surgical changes are seen from prior right mastectomy.  
No abnormal FDG uptake is noted in the area of the tiny left upper lobe nodule seen on previous CT although this nodule is below size threshold for detection of malignancy by CT.
IMPRESSION: 1.  Negative PET CT scan.  No evidence of malignancy.  
2.  The tiny left lung pulmonary nodule seen on CT is below size threshold for detection of malignancy by PET.  Further follow-up by CT is recommended in six months to confirm continued stability.

## 2007-07-11 LAB — HM DIABETES EYE EXAM: HM Diabetic Eye Exam: NORMAL

## 2007-08-03 ENCOUNTER — Encounter: Payer: Self-pay | Admitting: Family Medicine

## 2007-10-18 ENCOUNTER — Ambulatory Visit: Payer: Self-pay | Admitting: Oncology

## 2007-10-22 LAB — CBC WITH DIFFERENTIAL (CANCER CENTER ONLY)
BASO%: 0.7 % (ref 0.0–2.0)
Eosinophils Absolute: 0.2 10*3/uL (ref 0.0–0.5)
MCH: 28.9 pg (ref 26.0–34.0)
MONO%: 6.9 % (ref 0.0–13.0)
NEUT#: 2.4 10*3/uL (ref 1.5–6.5)
Platelets: 224 10*3/uL (ref 145–400)
RBC: 4.83 10*6/uL (ref 3.70–5.32)
RDW: 12.9 % (ref 10.5–14.6)
WBC: 5.1 10*3/uL (ref 3.9–10.0)

## 2007-10-22 LAB — COMPREHENSIVE METABOLIC PANEL
ALT: 31 U/L (ref 0–35)
AST: 33 U/L (ref 0–37)
Alkaline Phosphatase: 65 U/L (ref 39–117)
Calcium: 9.9 mg/dL (ref 8.4–10.5)
Chloride: 104 mEq/L (ref 96–112)
Creatinine, Ser: 0.65 mg/dL (ref 0.40–1.20)
Potassium: 4.3 mEq/L (ref 3.5–5.3)

## 2007-10-24 ENCOUNTER — Ambulatory Visit (HOSPITAL_COMMUNITY): Admission: RE | Admit: 2007-10-24 | Discharge: 2007-10-24 | Payer: Self-pay | Admitting: Oncology

## 2007-10-24 ENCOUNTER — Encounter: Payer: Self-pay | Admitting: Family Medicine

## 2007-10-24 ENCOUNTER — Ambulatory Visit: Payer: Self-pay | Admitting: Oncology

## 2007-10-25 IMAGING — CT CT ANGIO CHEST
2 of 5 series · 18 of 36 positions shown · IV contrast (120 ML OMNI 300)
Comparison: none

CLINICAL DATA: Chest pain and shortness of breath.  History of right breast cancer.
 CHEST CT ANGIO WITH CONTRAST:
TECHNIQUE: Multidetector CT imaging of the chest was performed during bolus injection of intravenous contrast.  Multiplanar CT angiographic image reconstructions were generated to evaluate the vascular anatomy.
 Contrast:  120 cc Omnipaque 300.

[Series 2: pe · axial · 0.71mm/px · z∈[-272,-25]mm · 17 of 225 slices shown]
[im 14/225  lung]
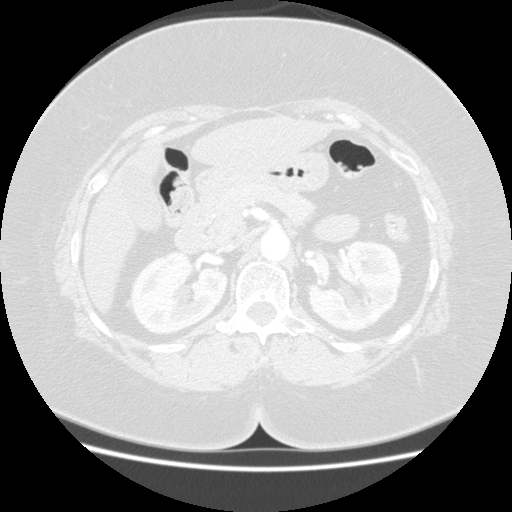
[im 27/225  mediastinal]
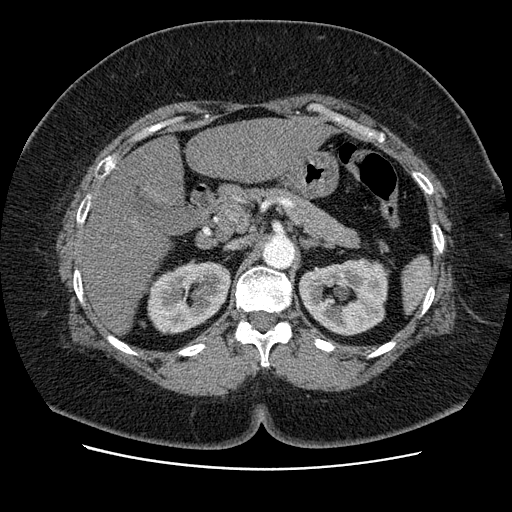
[im 40/225  lung]
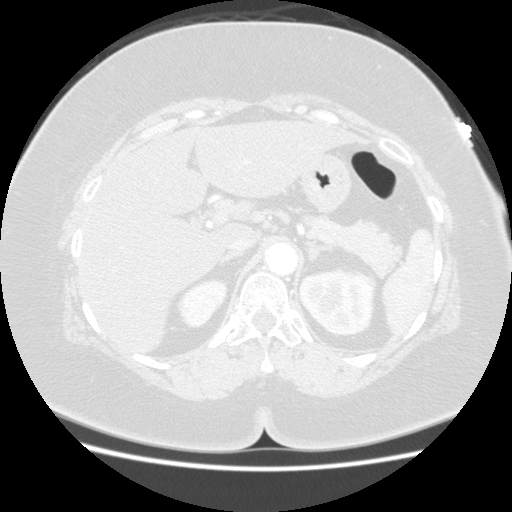
[im 53/225  mediastinal]
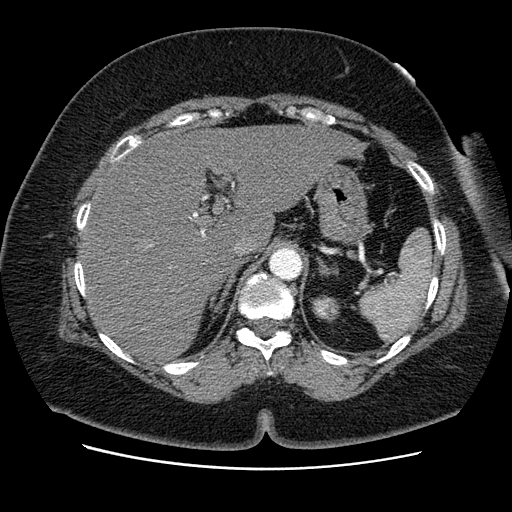
[im 66/225  lung]
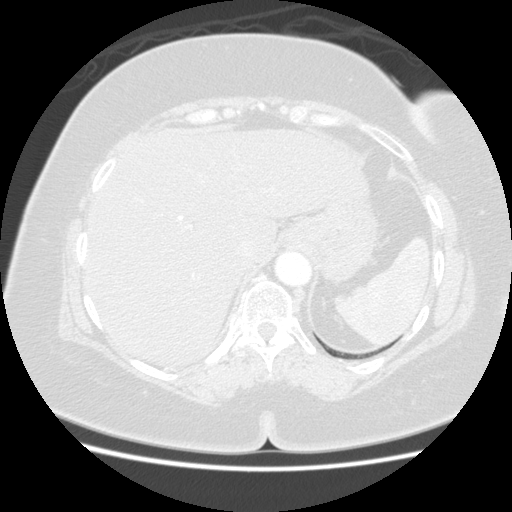
[im 80/225  mediastinal]
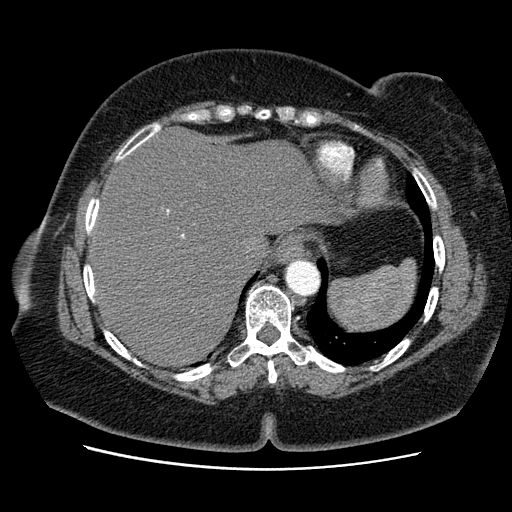
[im 93/225  lung]
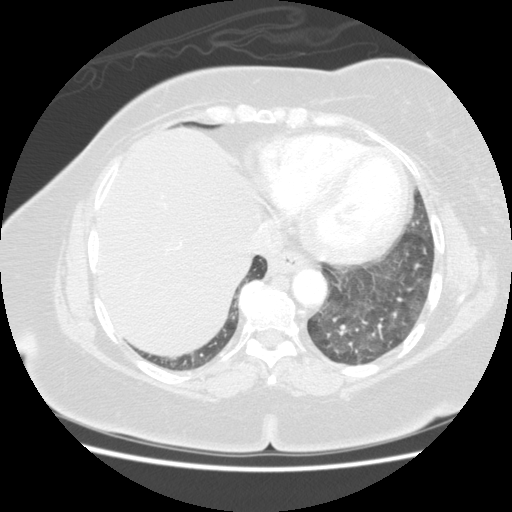
[im 106/225  mediastinal]
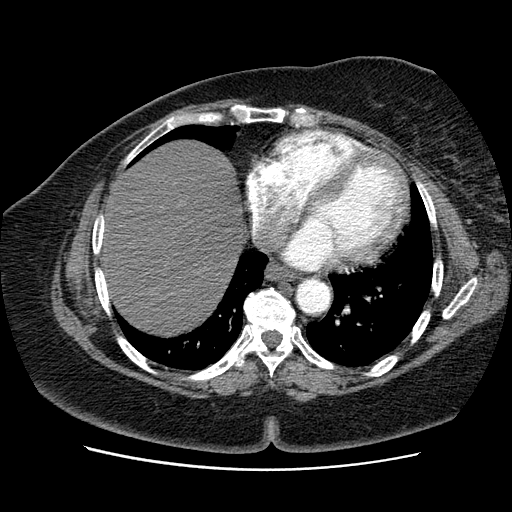
[im 119/225  lung]
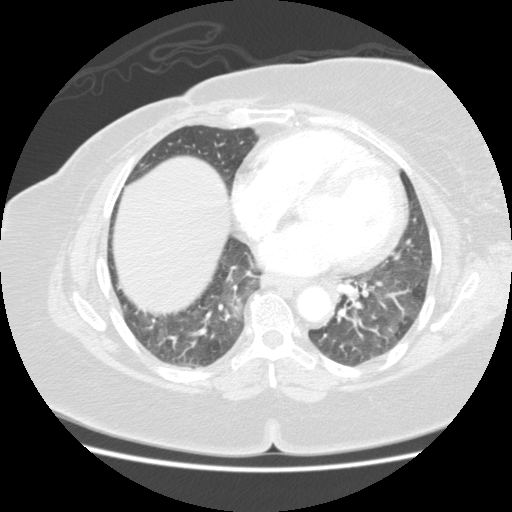
[im 122/225  mediastinal]
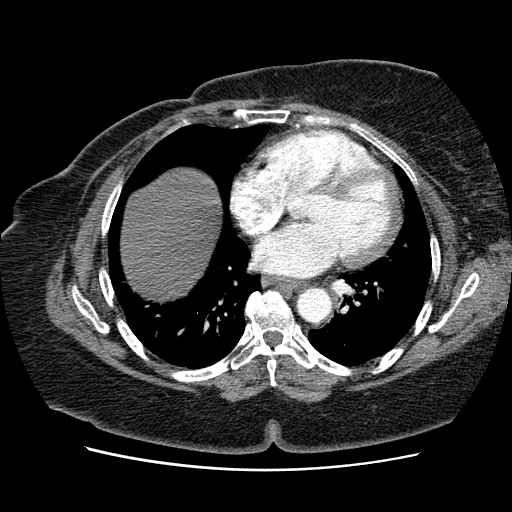
[im 132/225  lung]
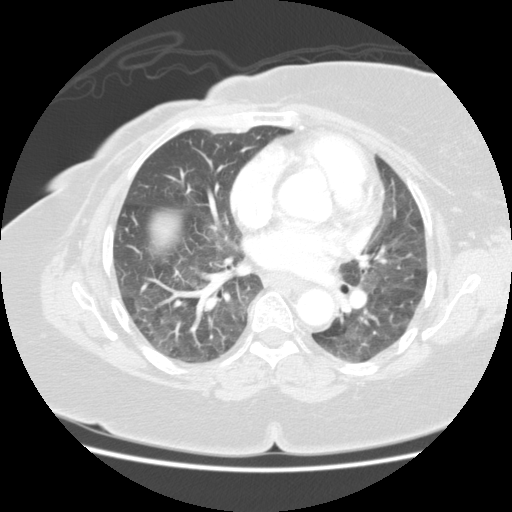
[im 145/225  mediastinal]
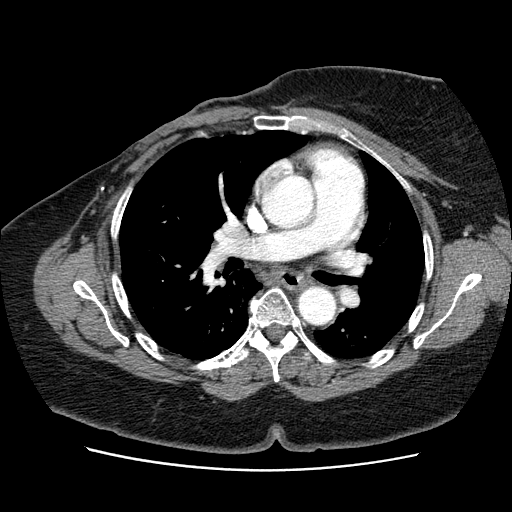
[im 159/225  lung]
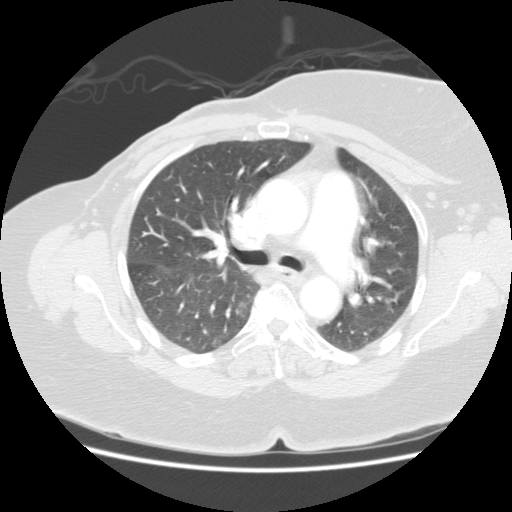
[im 172/225  mediastinal]
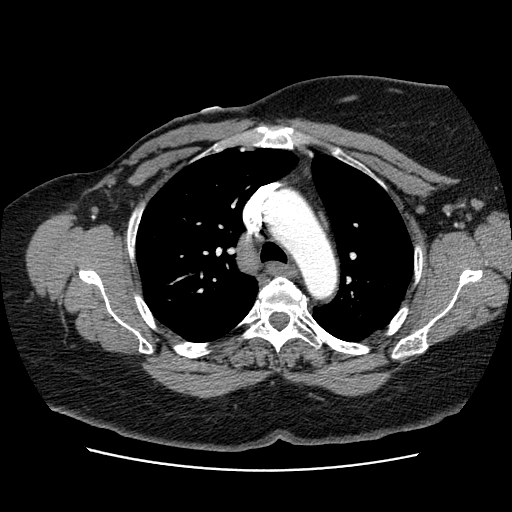
[im 185/225  lung]
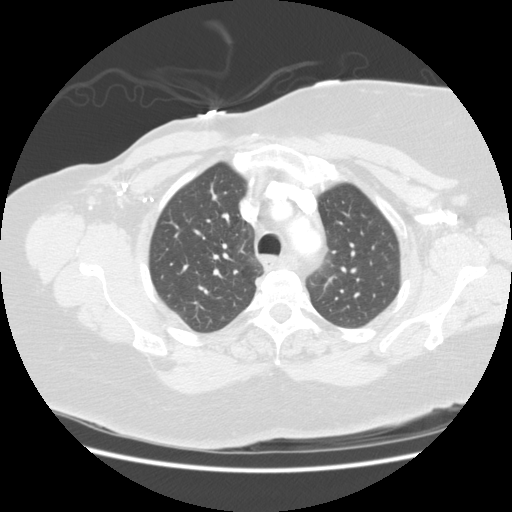
[im 198/225  mediastinal]
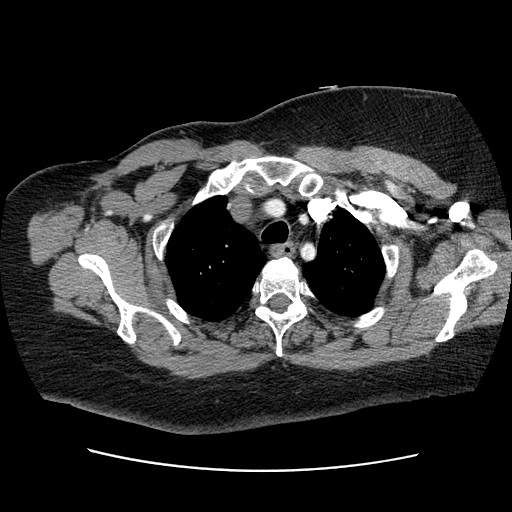
[im 211/225  lung]
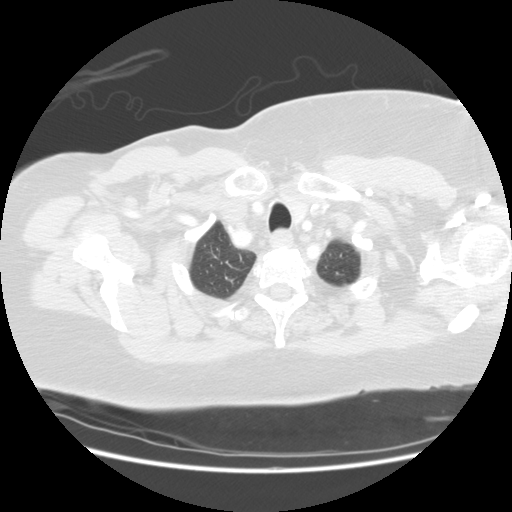

[Series 201: reformatted · coronal · 0.70mm/px · 1 of 116 slices shown]
[im 58/116  mediastinal]
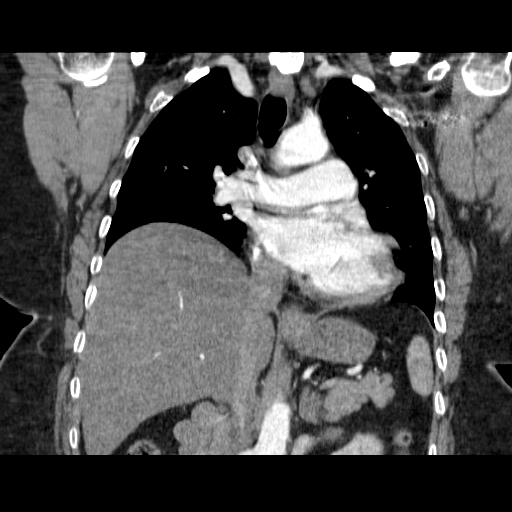

[18 of 36 positions shown; findings below may reference images not displayed]

FINDINGS: Scans were performed following intravenous injection of 120 cc of Omnipaque 300. 
 The scan demonstrates no evidence of pulmonary embolus.   The patient does have a patchy mosaic type pattern of patchy densities in the lower lobes bilaterally and also involving the inferior aspect of the left upper lobe.   The pattern is nonspecific and can be seen with small airway disease, vascular disease, and subtle infiltrative processes.   There is no discrete significant adenopathy.   Heart size is normal.   No effusions.   The visualized portion of the upper abdomen is normal except for a 6.4 mm stone in the mid portion of the left kidney.
IMPRESSION: No evidence of pulmonary embolus.   Subtle patchy haziness in both lower lobes and in the left upper lobe as described.   Does the patient have pulmonary artery hypertension or reactive airways disease?   Is the patient febrile?

## 2007-10-25 IMAGING — CR DG CHEST 1V PORT
1 series · 1 of 1 positions shown · non-contrast
Comparison: Chest CT 03/11/05.

CLINICAL DATA: Chest pain, shortness of breath.  
 PORTABLE CHEST:

[view not recorded]
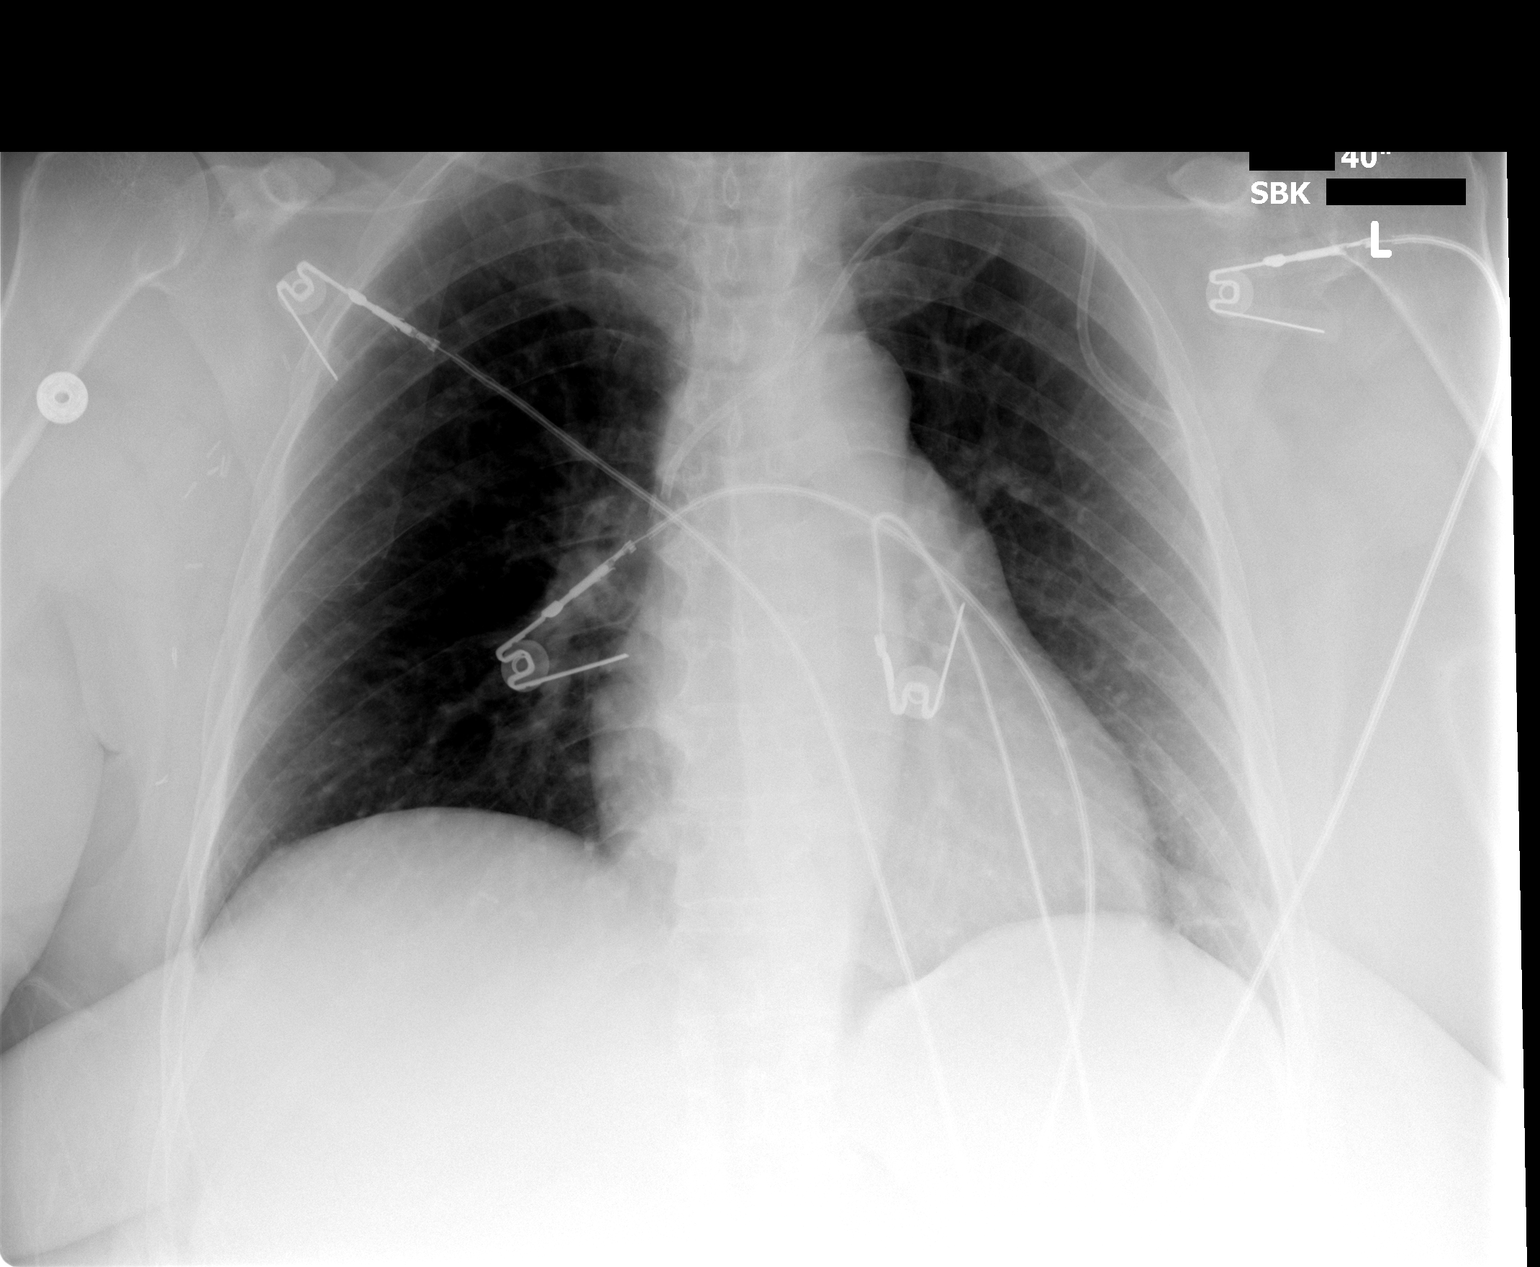

[1 of 1 positions shown; findings below may reference images not displayed]

FINDINGS: Right mastectomy noted.  Left central line tip is in the superior vena cava.  There is faint linear scarring at the left lung base peripherally.
IMPRESSION: No active cardiopulmonary disease is radiographically apparent.

## 2007-10-26 ENCOUNTER — Encounter: Payer: Self-pay | Admitting: Family Medicine

## 2007-10-30 ENCOUNTER — Encounter: Payer: Self-pay | Admitting: Family Medicine

## 2007-11-13 IMAGING — US US ABDOMEN COMPLETE
1 series · 13 of 25 positions shown · non-contrast
Comparison: [REDACTED] limited abdominal ultrasound 04/23/04 and [REDACTED] abdominal CT 03/11/05.

CLINICAL DATA: Right upper abdominal pain and nausea.    
ABDOMEN ULTRASOUND:
TECHNIQUE: Complete abdominal ultrasound examination was performed including evaluation of the liver, gallbladder, bile ducts, pancreas, kidneys, spleen, IVC, and abdominal aorta.  The study is limited due to patient?s body habitus and overlying gastrointestinal gas.

[Series 1: unknown · 13 of 83 slices shown]
[im 1/83]
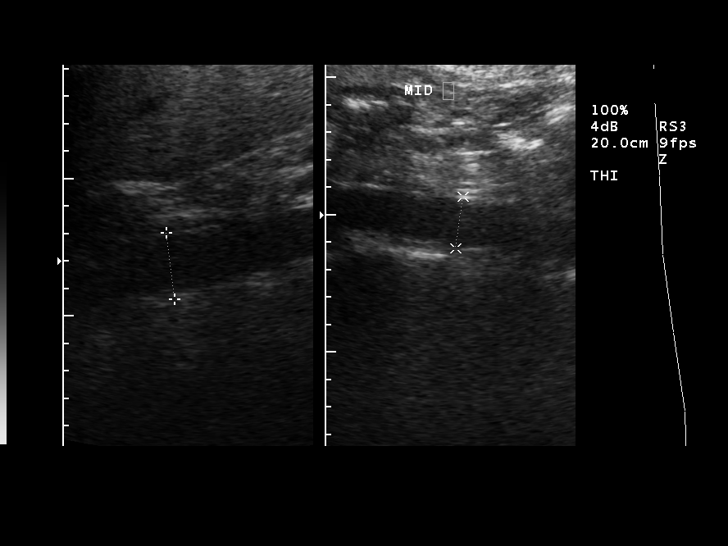
[im 7/83]
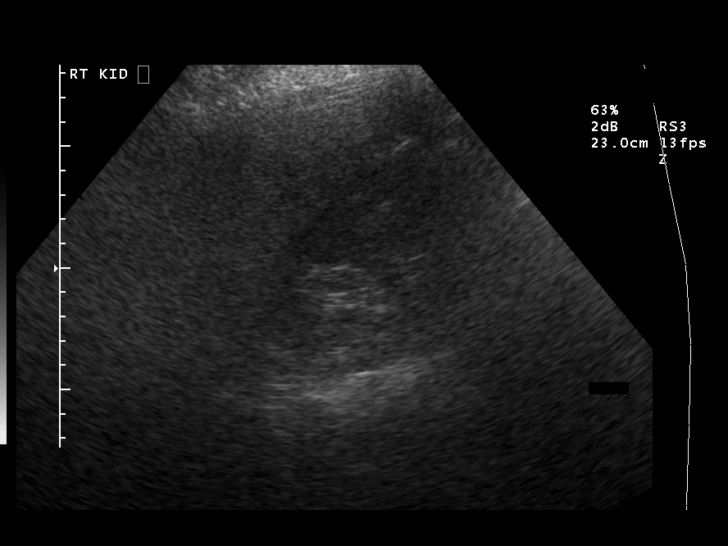
[im 14/83]
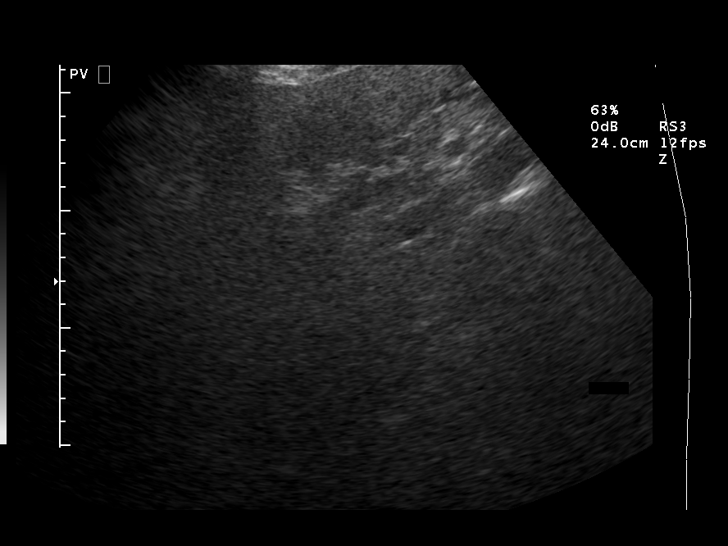
[im 21/83]
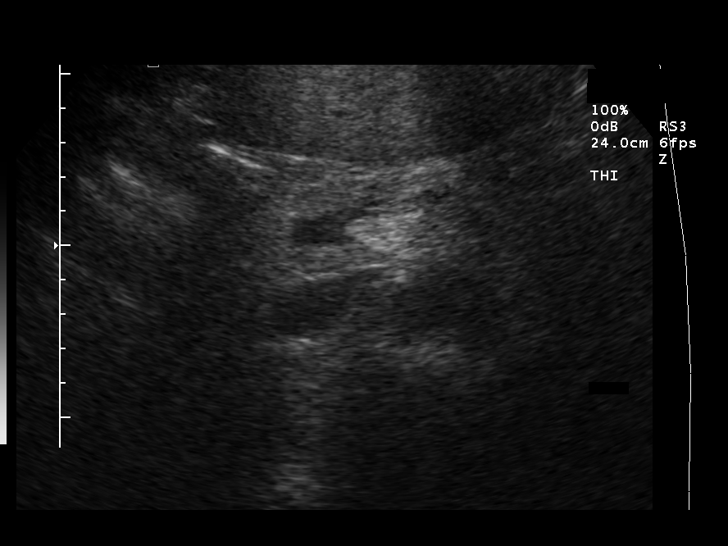
[im 28/83]
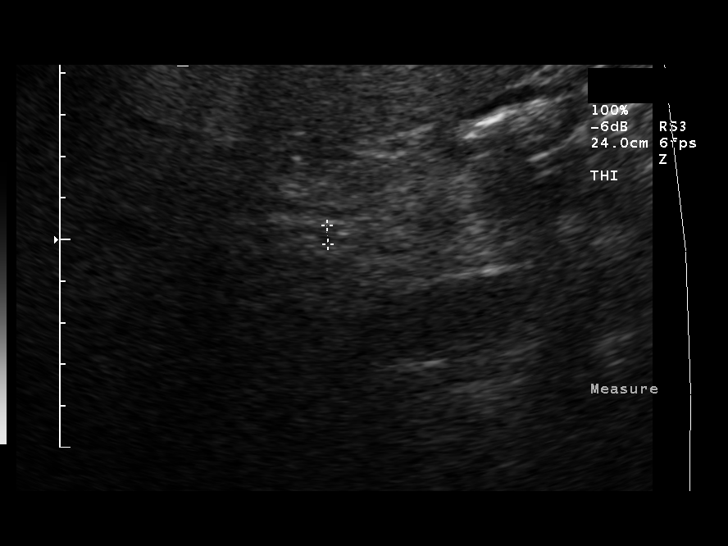
[im 35/83]
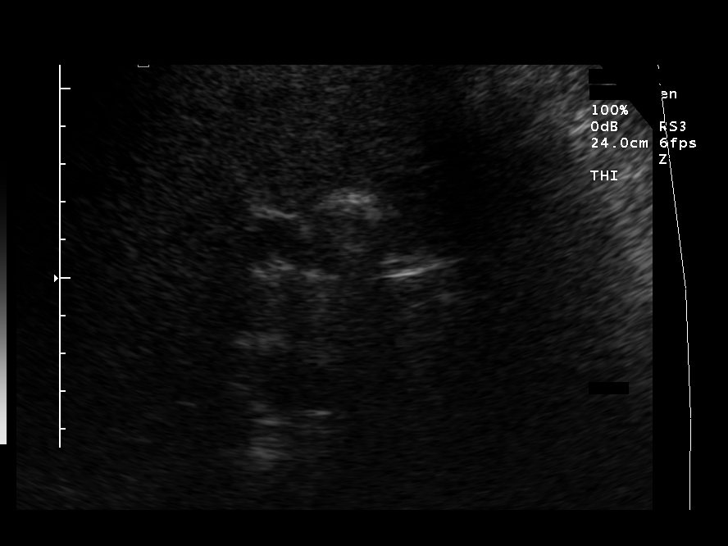
[im 42/83]
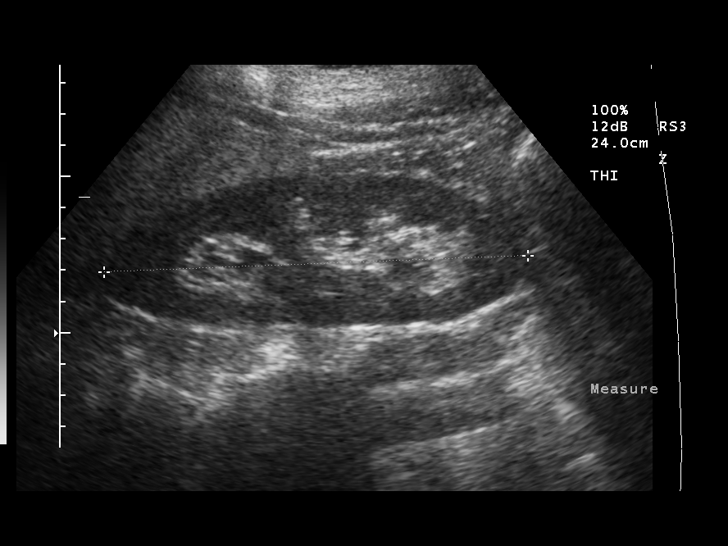
[im 48/83]
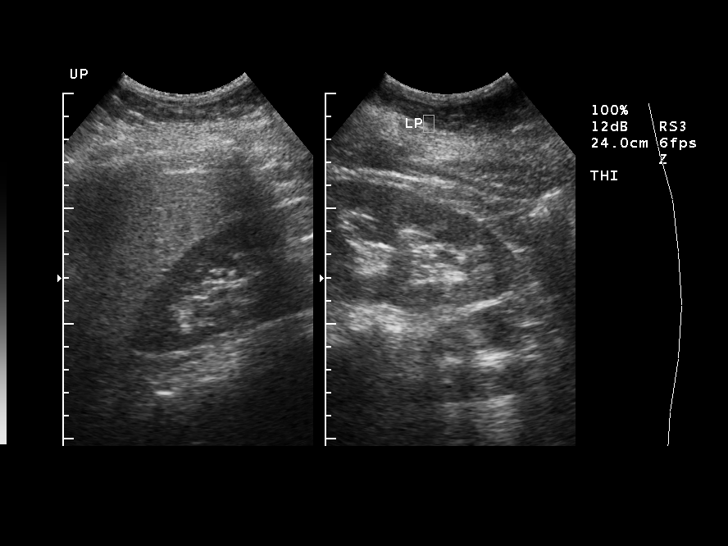
[im 55/83]
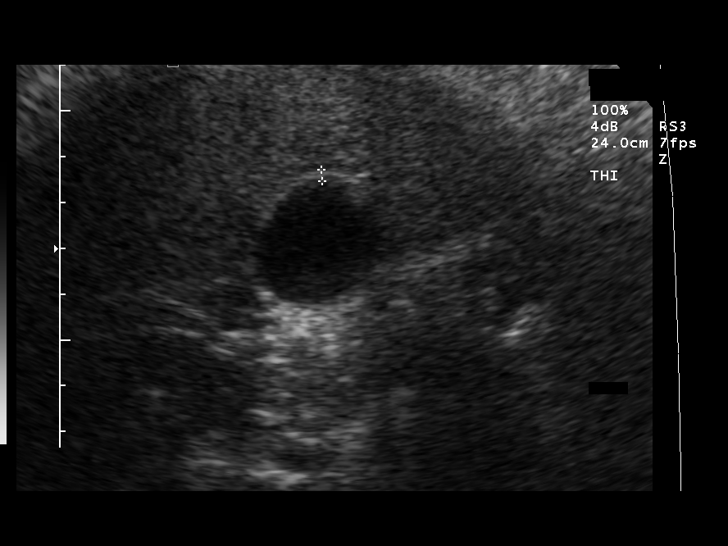
[im 62/83]
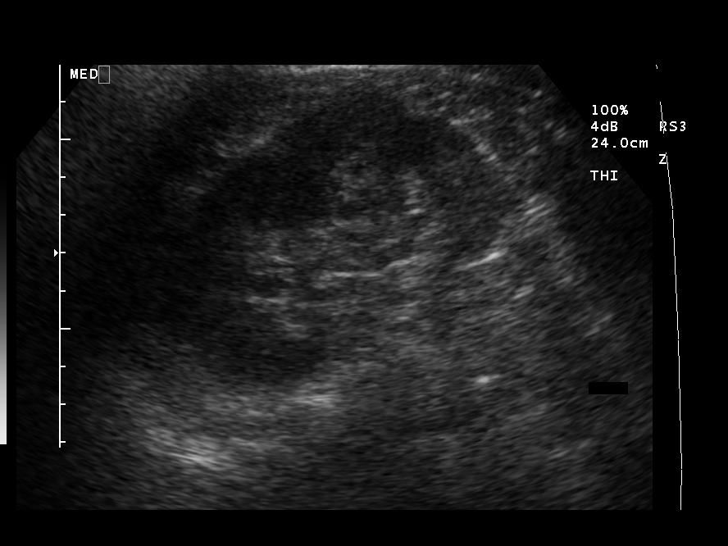
[im 69/83]
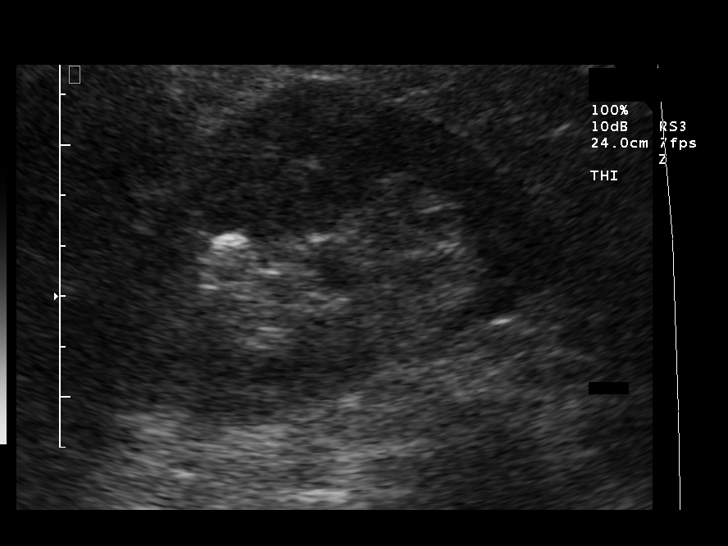
[im 76/83]
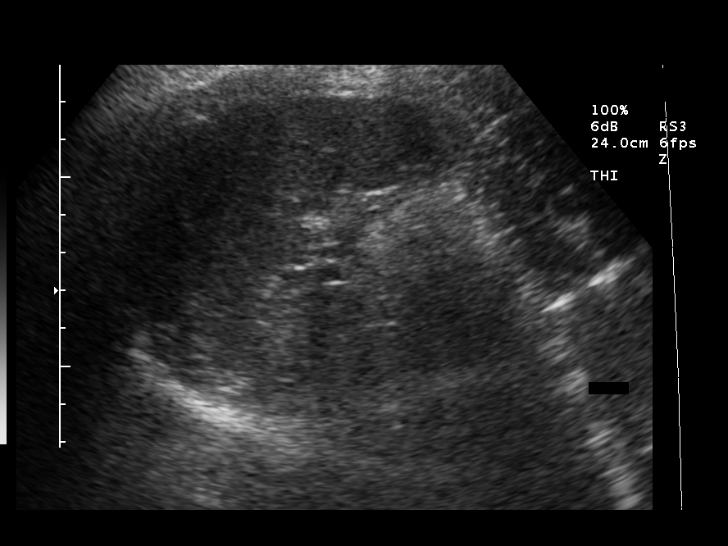
[im 83/83]
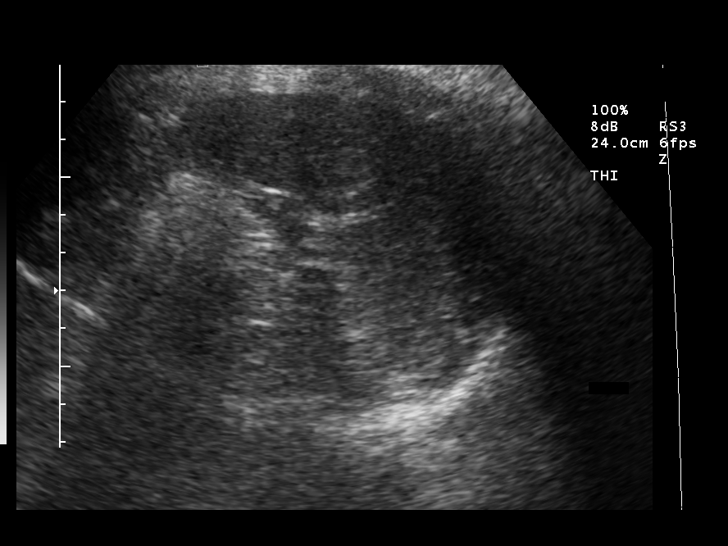

[13 of 25 positions shown; findings below may reference images not displayed]

FINDINGS: The gallbladder is currently sonographically normal without stones nor sludge with normal gallbladder wall thickness 3 mm.  No dilated intrahepatic nor extrahepatic bile ducts are seen with the common bile duct measuring normally at 5 mm.  The liver demonstrates diffuse fatty change with no focal lesions.  Limited assessment of inferior vena cava, pancreas demonstrates no definite abnormality.  Spleen measures 10.2 cm long.  Right kidney measures 13.6 cm long with the left kidney measuring 12 cm long with mild bilateral pelvocaliectasis.  There is no interval change in non-obstructing left renal calculi measuring 8 mm at the midportion and smaller at the lower pole region.  Maximum visualized abdominal aortic diameter is normal proximally at 2.4 cm.
IMPRESSION: 1.  Limitations due to patient?s body habitus and overlying gastrointestinal gas with special limitation of pancreas and inferior vena cava.  
2.   Slight diffuse fatty infiltration of the liver.  
3.  Mild bilateral pelvocaliectasis with non-obstructing left renal calculi unchanged since previous abdominal CT 03/11/05 and [REDACTED] renal ultrasound 04/23/04.
4.  Otherwise no significant abnormality.

## 2008-02-11 IMAGING — CT CT ABDOMEN W/ CM
1 of 3 series · 12 of 32 positions shown, 18 images · IV contrast (omnipaque)
Comparison: none

CLINICAL DATA: 55 year-old female, history of breast cancer status post right mastectomy. Occasional shortness of breath.
 CHEST CT WITH CONTRAST:
TECHNIQUE: Multidetector CT imaging of the chest was performed following the standard protocol during bolus administration of intravenous contrast.
 Contrast:  125 ml Omnipaque 300
TECHNIQUE: Multidetector CT imaging of the abdomen was performed following the standard protocol during bolus administration of intravenous contrast.

[Series 2: c/a 5.0 b40f st · axial · 0.86mm/px · z∈[-410,-50]mm · 12 of 84 slices shown, 18 images]
[im 6/84  soft-tissue]
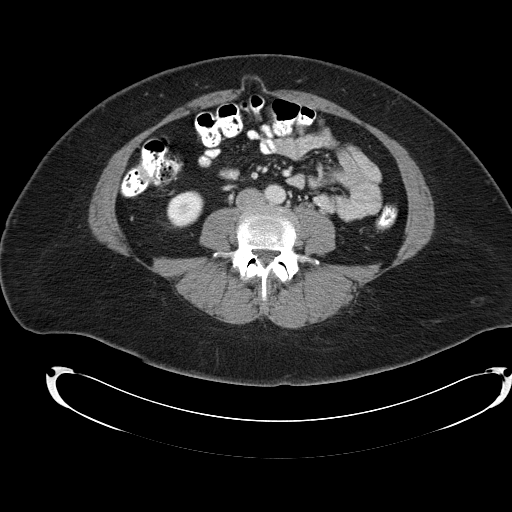
[im 6/84  bone]
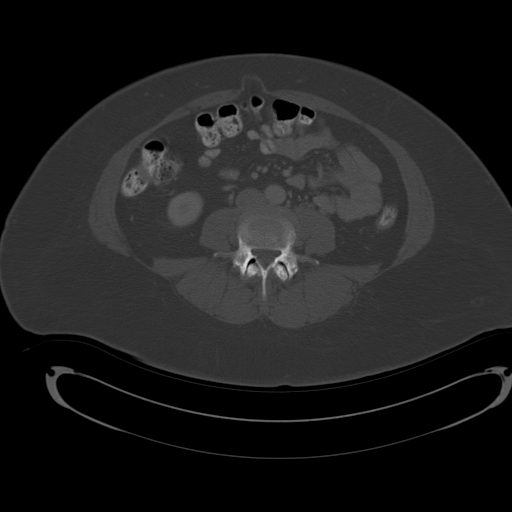
[im 12/84  soft-tissue]
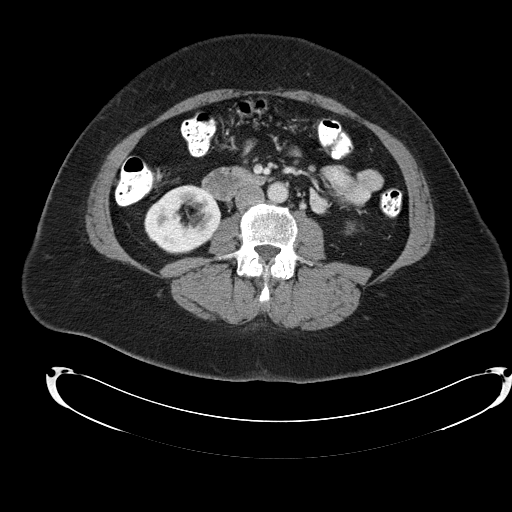
[im 18/84  soft-tissue]
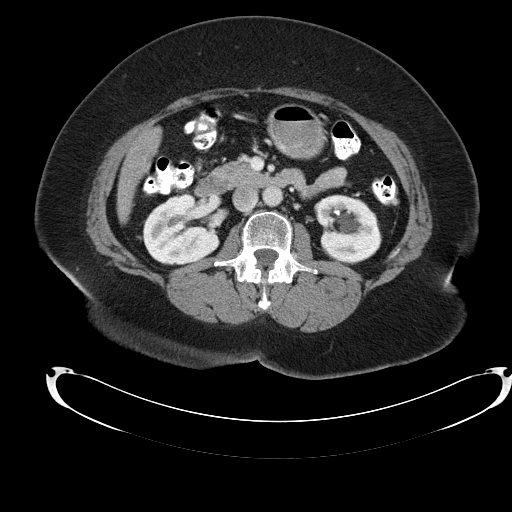
[im 24/84  soft-tissue]
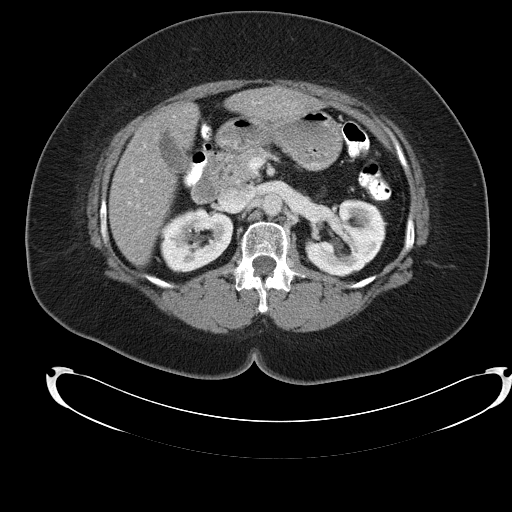
[im 30/84  soft-tissue]
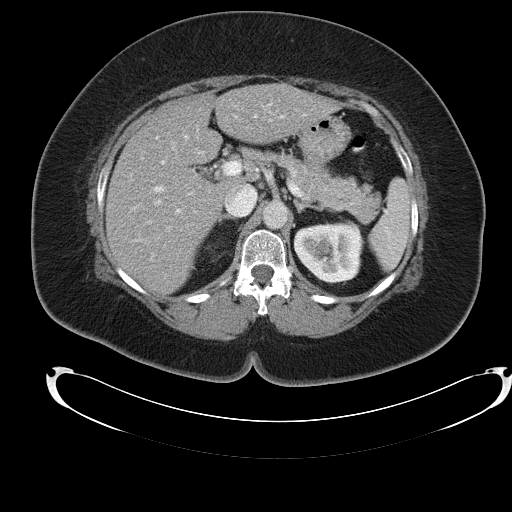
[im 36/84  soft-tissue]
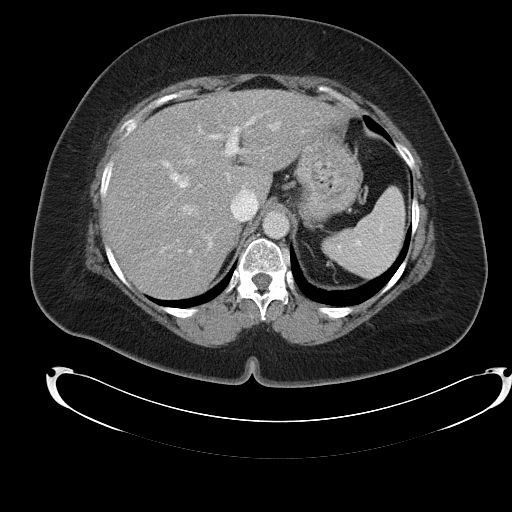
[im 48/84  soft-tissue]
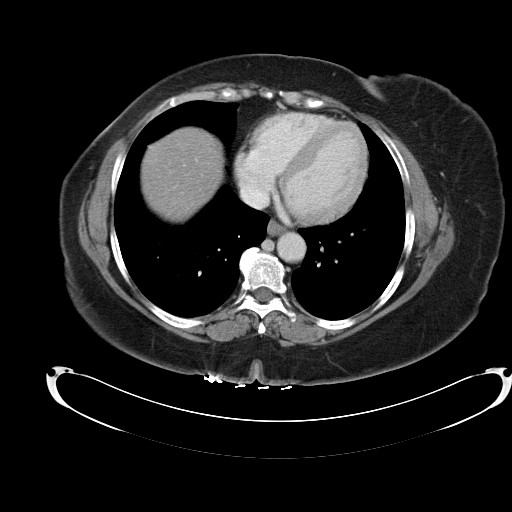
[im 54/84  soft-tissue]
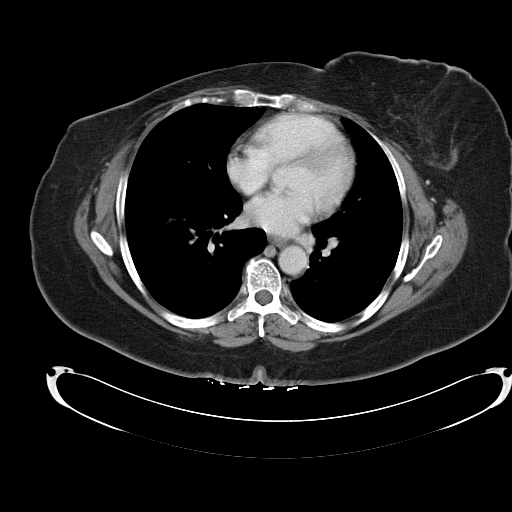
[im 60/84  soft-tissue]
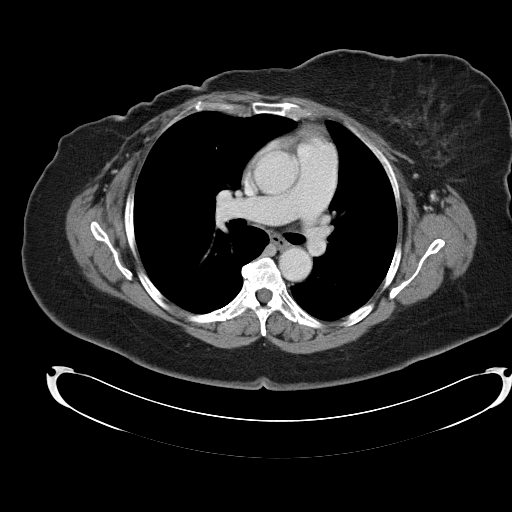
[im 60/84  lung]
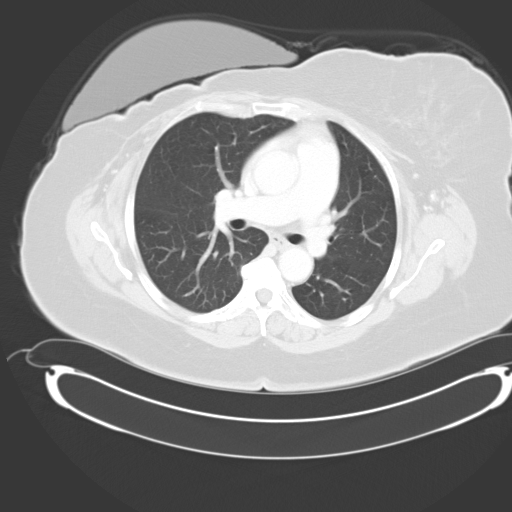
[im 60/84  bone]
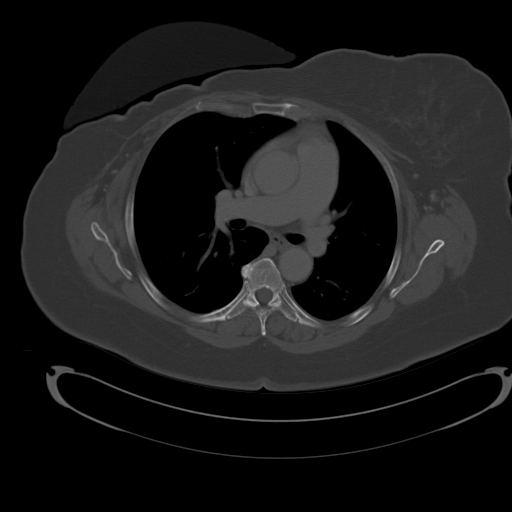
[im 66/84  soft-tissue]
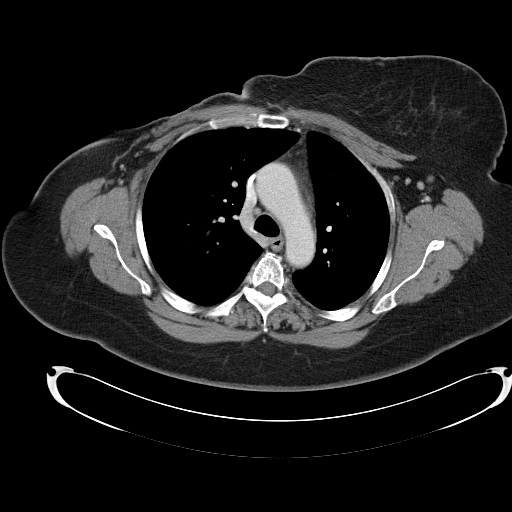
[im 66/84  lung]
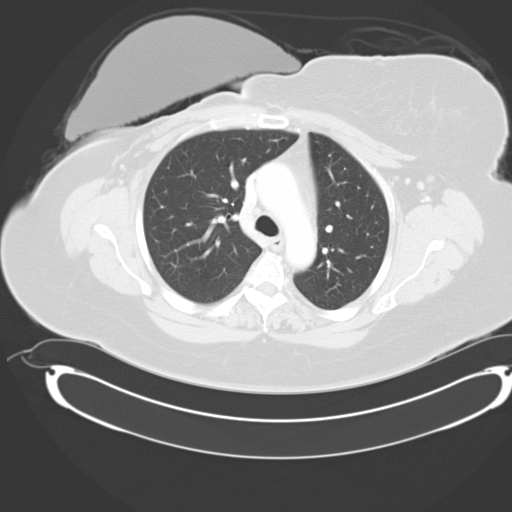
[im 72/84  soft-tissue]
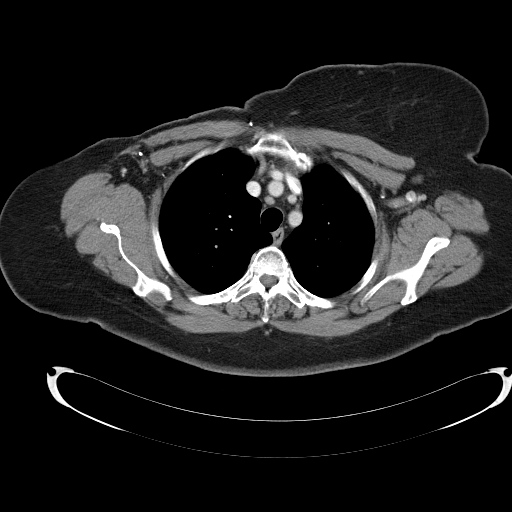
[im 72/84  lung]
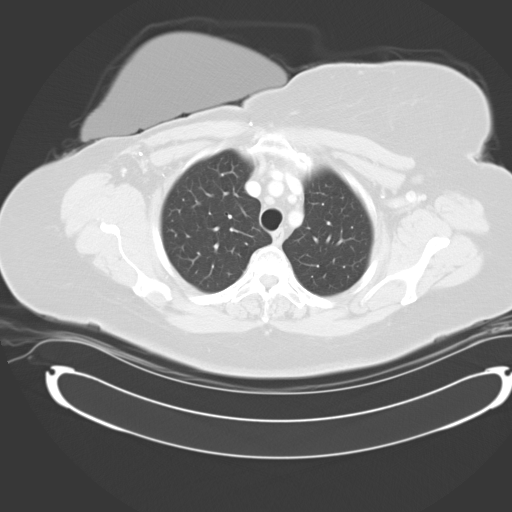
[im 78/84  soft-tissue]
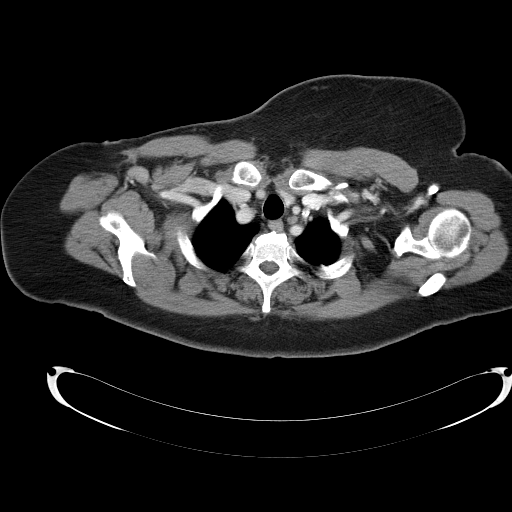
[im 78/84  lung]
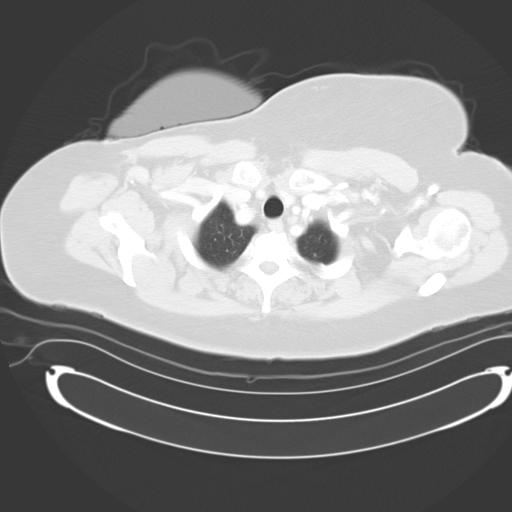

[12 of 32 positions shown; findings below may reference images not displayed]

FINDINGS: There is interval decrease in size of pretracheal lymph node which now measures 6 mm compared to 8 mm on the prior study.  A node at the thoracic inlet on the left is slightly smaller.  No pathologically enlarged lymph nodes are present within the mediastinum or axilla.  Surgical clips are noted within the right axilla following patient?s mastectomy and lymph node dissection.  There is some prominence of the right subclavian vein, stable since prior exam and possibly related to patient?s prior surgery.
 Lung aeration is improved from the prior study.  There are no focal nodules, mass lesions or airspace disease.
IMPRESSION: 1.  Improved aeration of the lungs.
 2.  Decrease in size of mediastinal and left axillary lymph nodes. These are likely reactive in nature.
 ABDOMEN CT WITH CONTRAST:
FINDINGS: The infused appearance of the liver and spleen is normal. Pancreas is unremarkable. Gallbladder and bilateral adrenal glands are within normal limits. Focal calcification of the left kidney at 6 mm is stable.  Additional calcification is evident near the lower pole of the left kidney measuring 5-6 mm. Neither of these is obstructive. No other focal renal lesions are seen.   No significant abdominal lymphadenopathy or free fluid. A small periumbilical hernia is evident without associated bowel.  Visualized large and small bowel is unremarkable.
 Bone windows demonstrate degenerative facet change of the lower lumbar spine.  No focal lytic or blastic lesions are evident.
IMPRESSION: 1.  No evidence for metastatic disease of the abdomen.
 2.  Stable left renal lithiasis without obstruction.

## 2008-02-23 IMAGING — CR DG CHEST 2V
2 series · 2 of 2 positions shown · non-contrast
Comparison: 07/03/05.

CLINICAL DATA: Chest pain for 2 days.  Diabetic.  History of breast cancer. 
 CHEST - 2 VIEW:

[w chest pa]
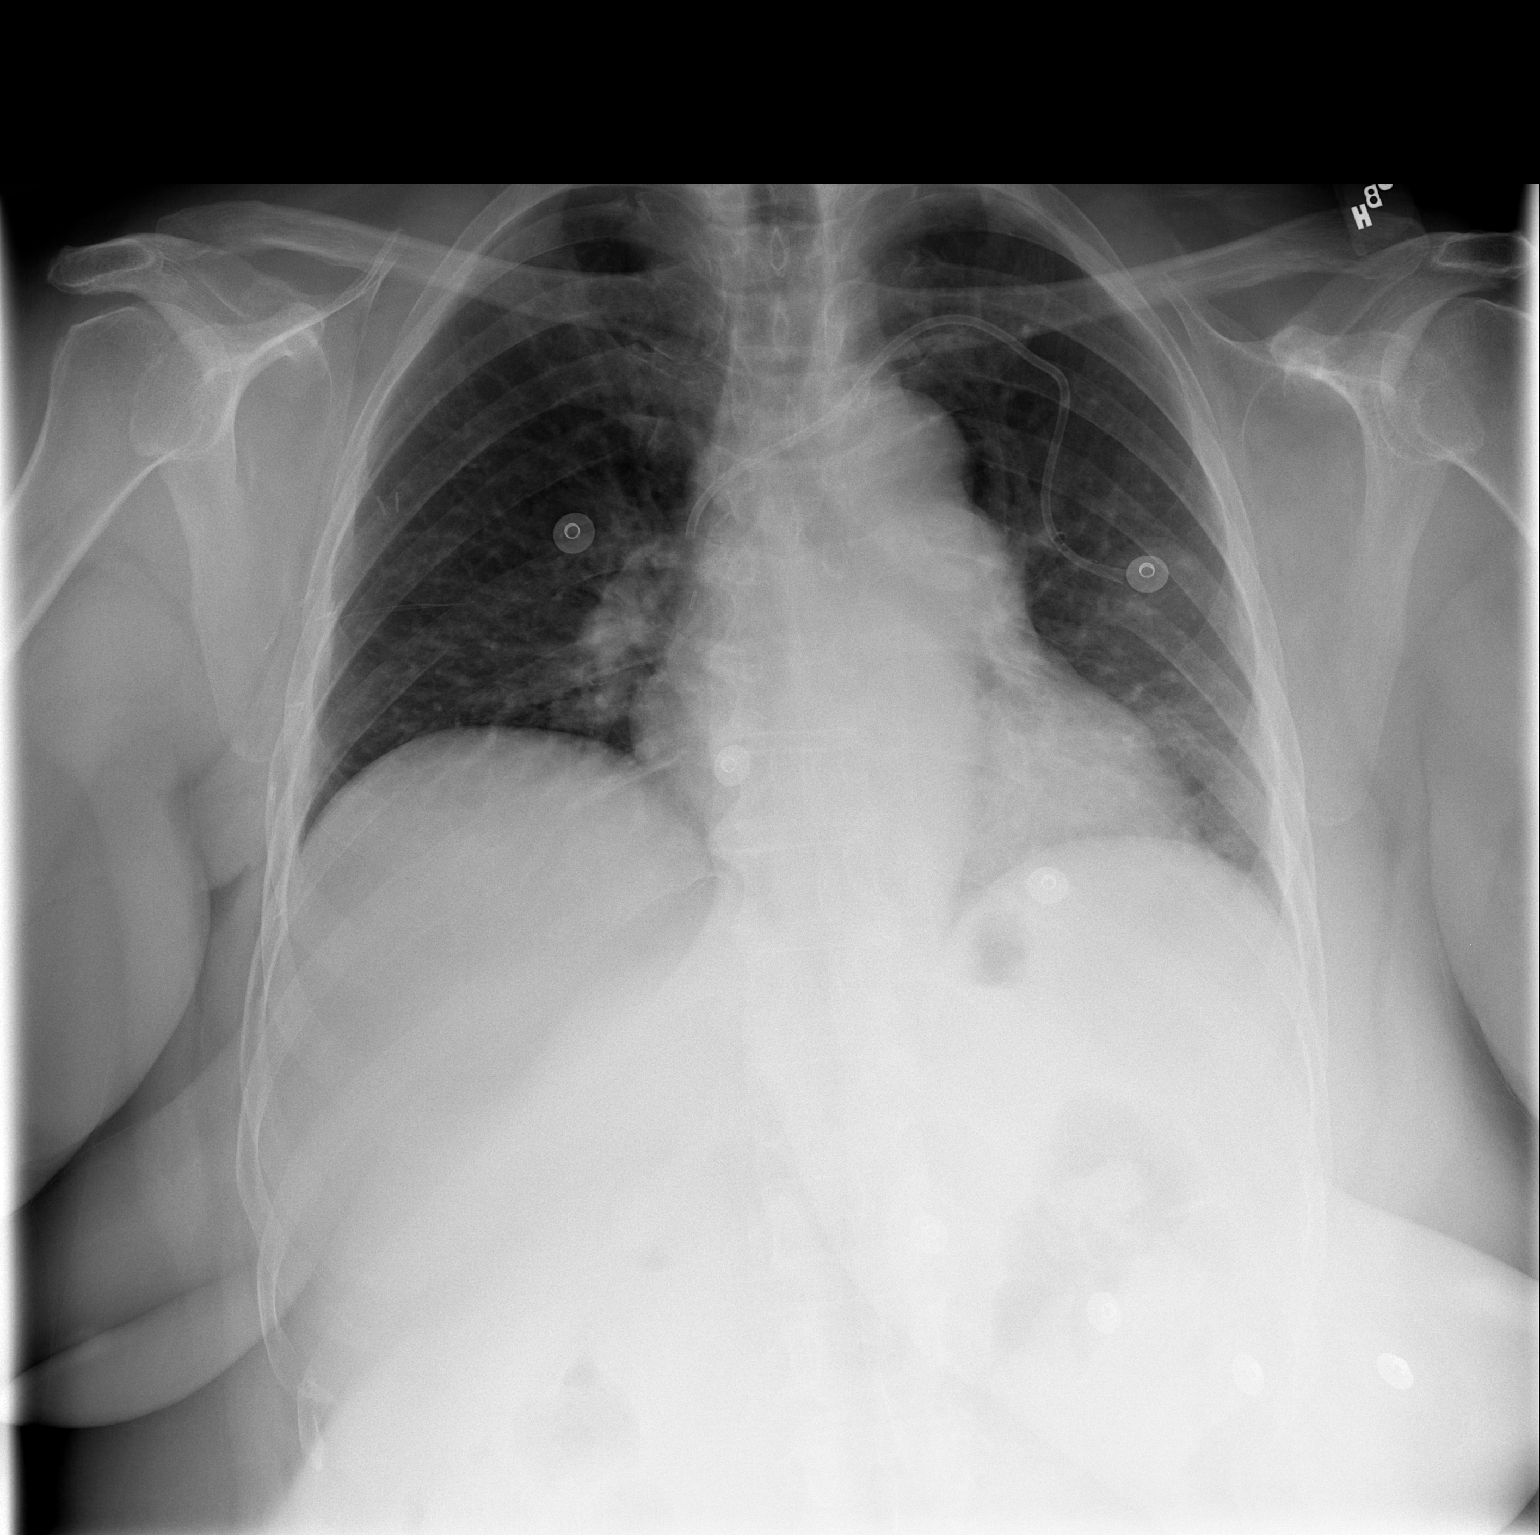

[w chest lat]
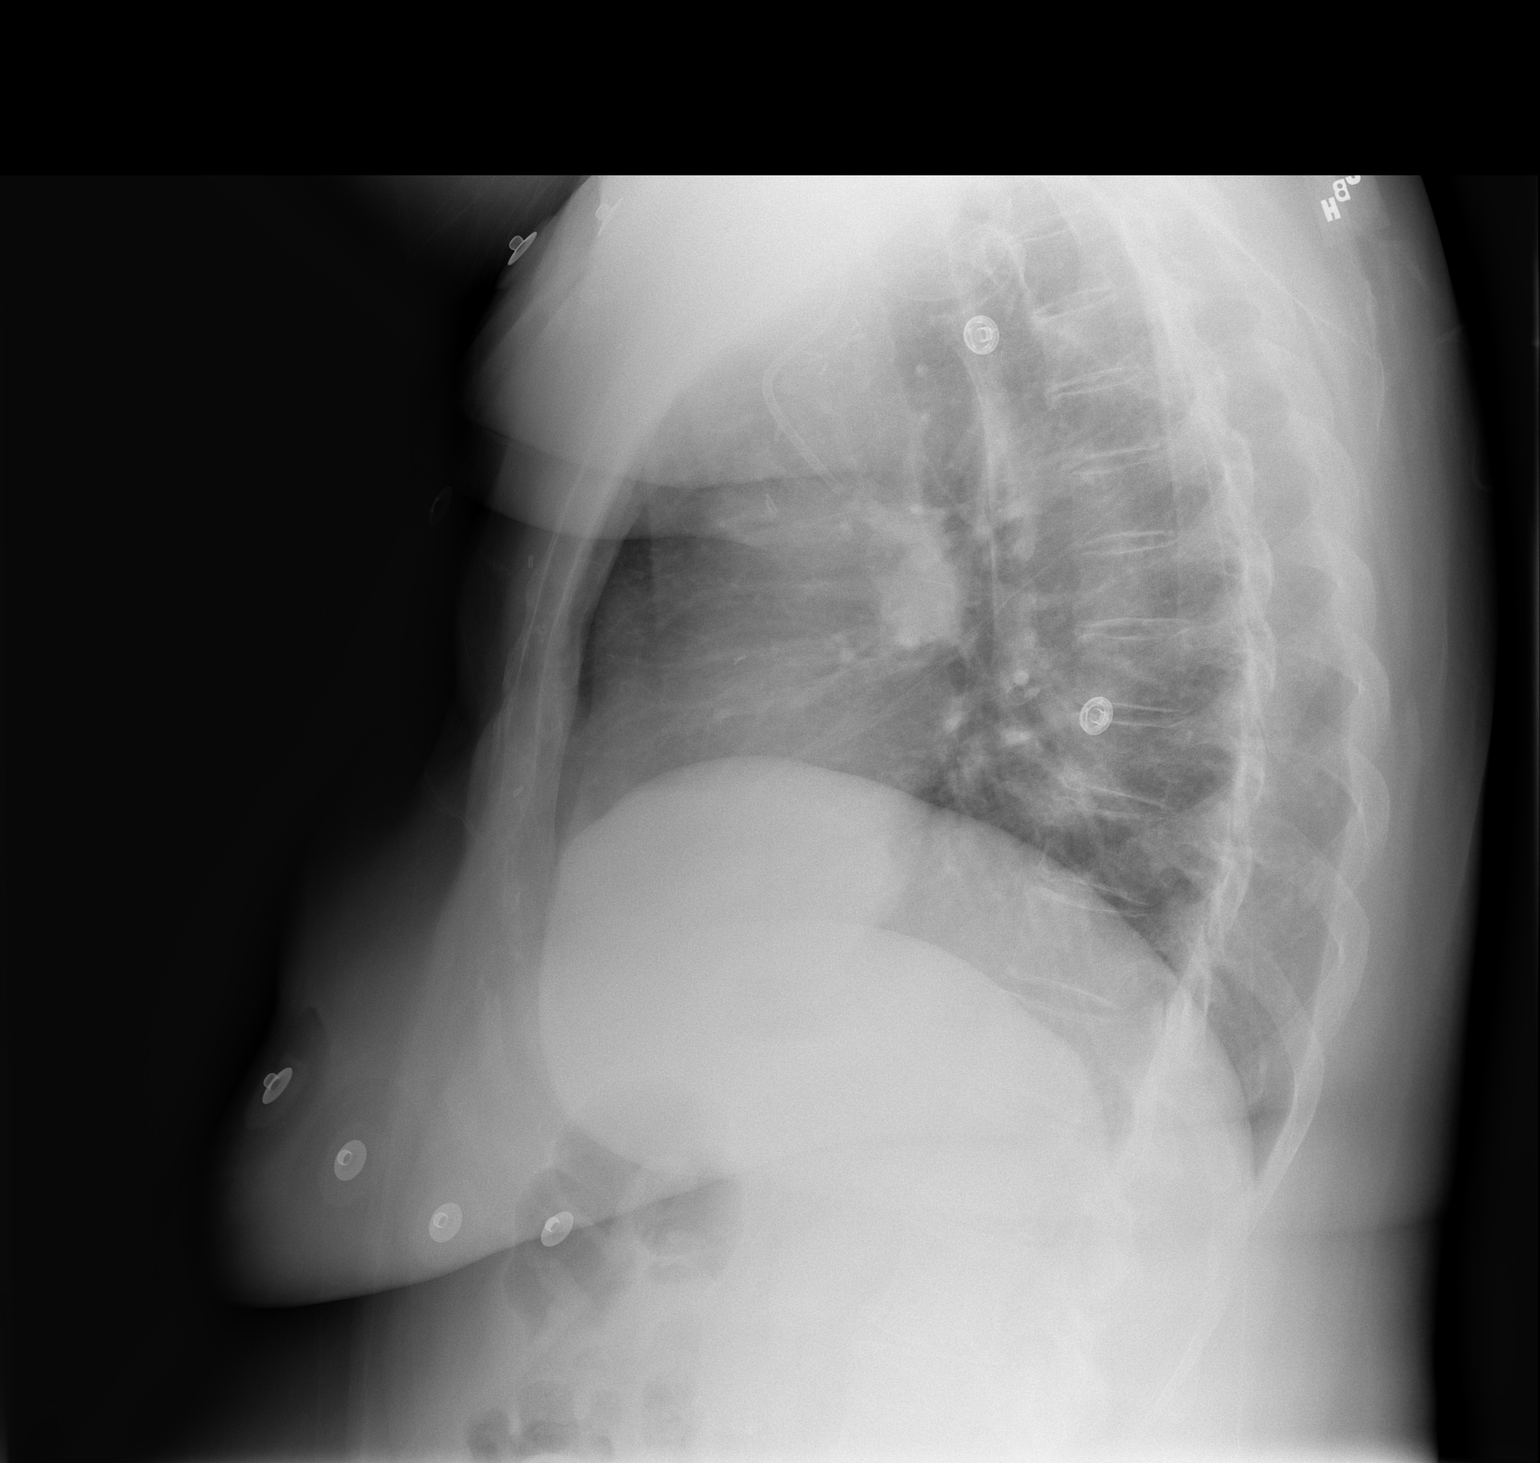

[2 of 2 positions shown; findings below may reference images not displayed]

FINDINGS: Port-a-cath has its tip in the superior vena cava.  Heart size is normal.  The patient has taken a poor inspiration.  It is possible there could be mild basilar atelectasis or infiltrate.  No effusions.  Bony structures are unremarkable. Patient has had previous mastectomy on the right.
IMPRESSION: Poor inspiration versus basilar atelectasis/infiltrate.

## 2008-04-23 ENCOUNTER — Ambulatory Visit: Payer: Self-pay | Admitting: Oncology

## 2008-04-29 ENCOUNTER — Encounter: Payer: Self-pay | Admitting: Family Medicine

## 2008-04-29 ENCOUNTER — Ambulatory Visit: Payer: Self-pay | Admitting: Oncology

## 2008-04-29 ENCOUNTER — Ambulatory Visit (HOSPITAL_COMMUNITY): Admission: RE | Admit: 2008-04-29 | Discharge: 2008-04-29 | Payer: Self-pay | Admitting: Oncology

## 2008-05-01 ENCOUNTER — Encounter: Payer: Self-pay | Admitting: Family Medicine

## 2008-06-30 ENCOUNTER — Ambulatory Visit: Payer: Self-pay | Admitting: Family Medicine

## 2008-07-02 ENCOUNTER — Encounter: Admission: RE | Admit: 2008-07-02 | Discharge: 2008-07-17 | Payer: Self-pay | Admitting: Family Medicine

## 2008-07-02 ENCOUNTER — Encounter: Payer: Self-pay | Admitting: Family Medicine

## 2008-07-02 LAB — CONVERTED CEMR LAB
AST: 32 units/L (ref 0–37)
Hgb A1c MFr Bld: 12.4 % — ABNORMAL HIGH (ref 4.6–6.5)
Microalb Creat Ratio: 25.3 mg/g (ref 0.0–30.0)
Phosphorus: 2.7 mg/dL (ref 2.3–4.6)

## 2008-07-06 ENCOUNTER — Emergency Department (HOSPITAL_COMMUNITY): Admission: EM | Admit: 2008-07-06 | Discharge: 2008-07-06 | Payer: Self-pay | Admitting: Emergency Medicine

## 2008-07-07 ENCOUNTER — Telehealth: Payer: Self-pay | Admitting: Family Medicine

## 2008-07-17 ENCOUNTER — Encounter: Payer: Self-pay | Admitting: Family Medicine

## 2008-08-05 ENCOUNTER — Ambulatory Visit: Payer: Self-pay | Admitting: Family Medicine

## 2008-08-05 DIAGNOSIS — I10 Essential (primary) hypertension: Secondary | ICD-10-CM | POA: Insufficient documentation

## 2008-08-10 IMAGING — CT CT CHEST W/ CM
2 of 5 series · 16 of 46 positions shown, 18 images · IV contrast (omnipaque)
Comparison: 10/20/05.

04/20/06 – DUPLICATE COPY for exam association in [facility] – No change from original report.
CLINICAL DATA: 55-year-old with restaging of breast cancer. 
 CHEST CT WITH CONTRAST:
TECHNIQUE: Multidetector CT imaging of the chest was performed following the standard protocol during bolus administration of intravenous contrast.
 Contrast:  125 cc Omnipaque 300
TECHNIQUE: Multidetector CT imaging of the abdomen was performed following the standard protocol during bolus administration of intravenous contrast.

[Series 2: ca 5.0 b40f · axial · 0.86mm/px · z∈[-414,-39]mm · 13 of 87 slices shown, 15 images]
[im 6/87  soft-tissue]
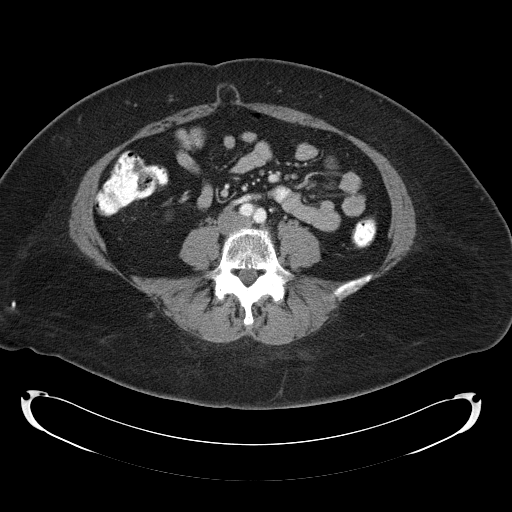
[im 6/87  bone]
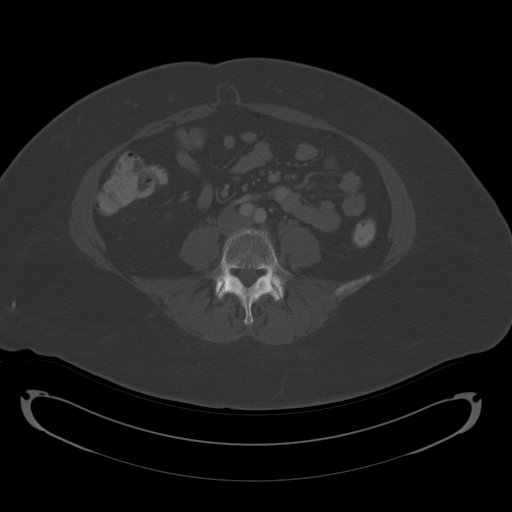
[im 12/87  soft-tissue]
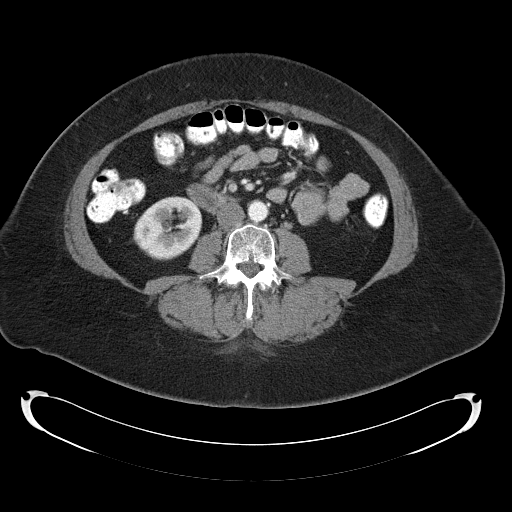
[im 18/87  soft-tissue]
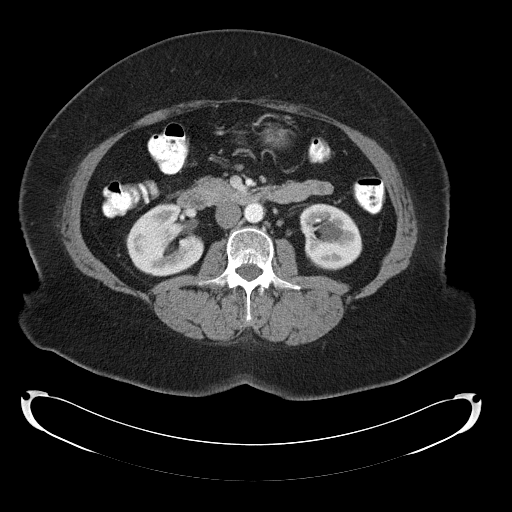
[im 23/87  soft-tissue]
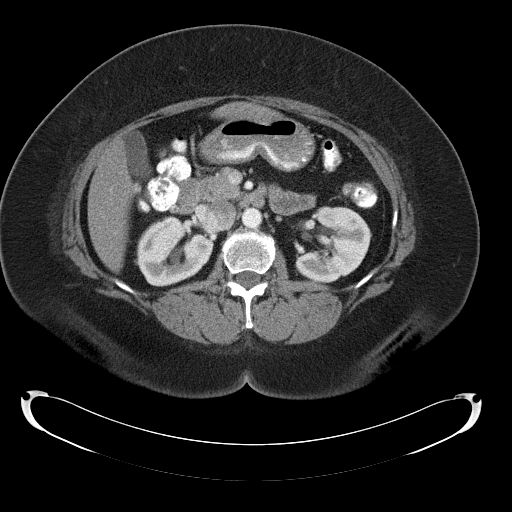
[im 29/87  soft-tissue]
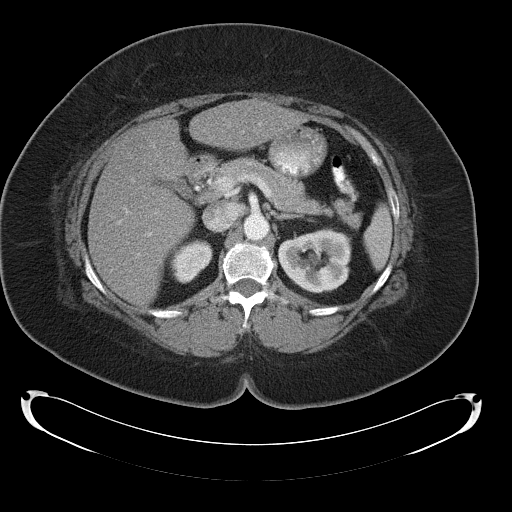
[im 35/87  soft-tissue]
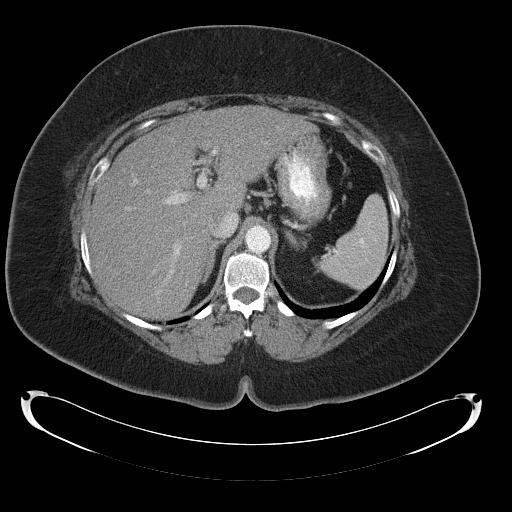
[im 46/87  soft-tissue]
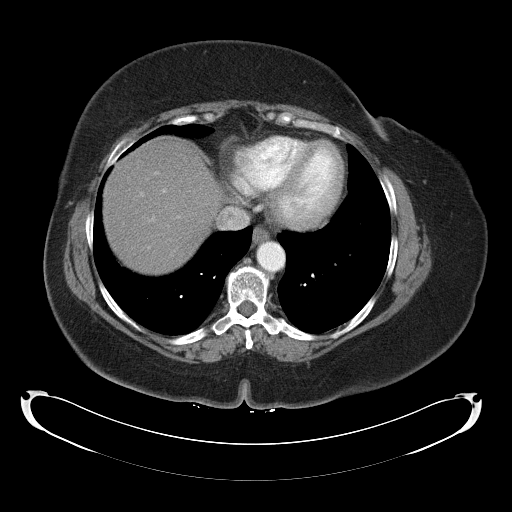
[im 52/87  soft-tissue]
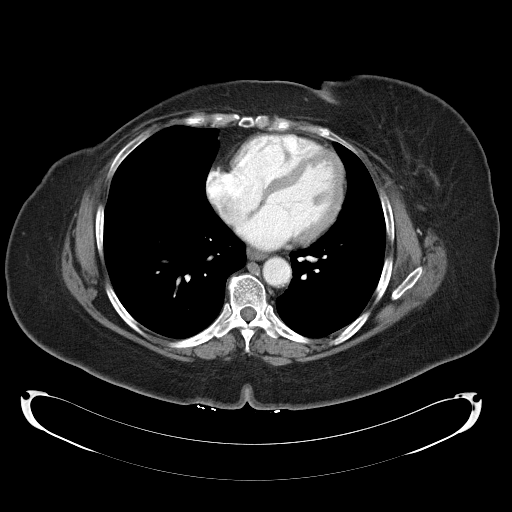
[im 58/87  soft-tissue]
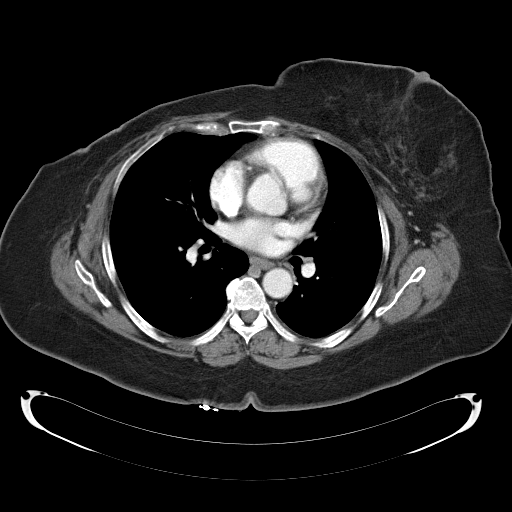
[im 58/87  bone]
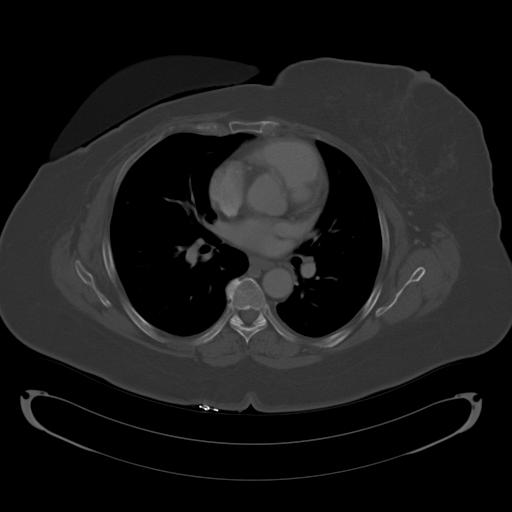
[im 64/87  soft-tissue]
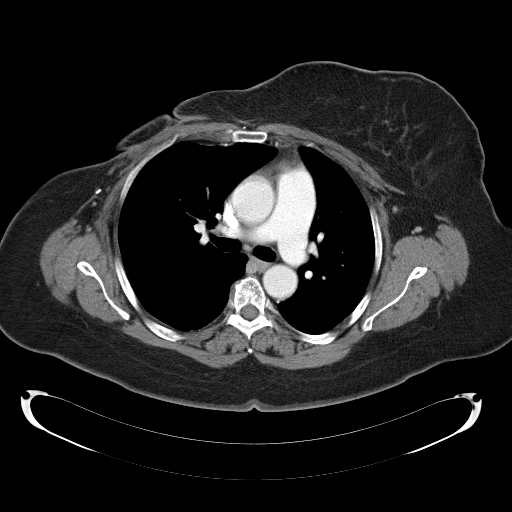
[im 69/87  soft-tissue]
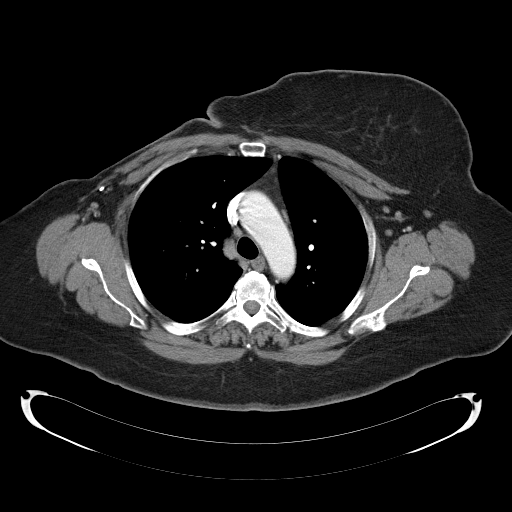
[im 75/87  soft-tissue]
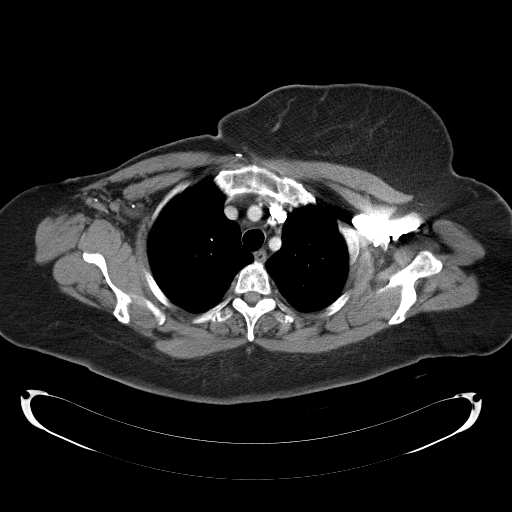
[im 81/87  soft-tissue]
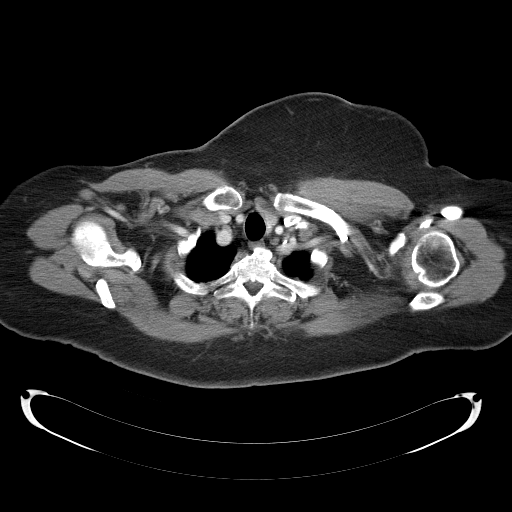

[Series 603: <mpr thick range> · coronal · 0.86mm/px · 3 of 88 slices shown]
[im 30/88  soft-tissue]
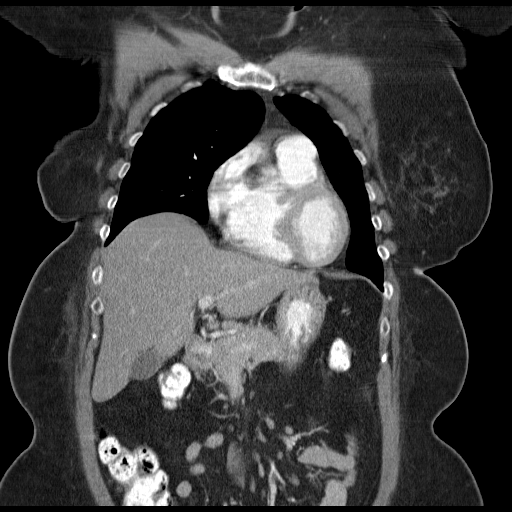
[im 39/88  soft-tissue]
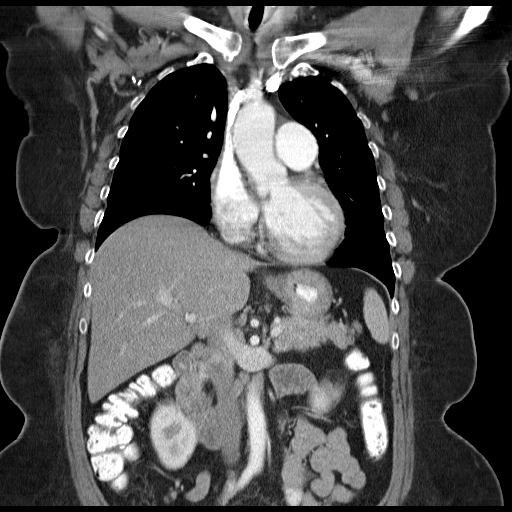
[im 49/88  soft-tissue]
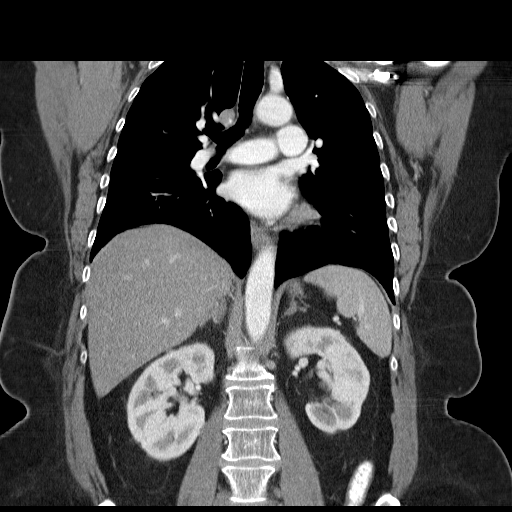

[16 of 46 positions shown; findings below may reference images not displayed]

FINDINGS: Stable postoperative changes consistent with right mastectomy.  Stable small lymph nodes in the left axilla.  No significant adenopathy in the right axilla status-post a dissection.  Again noted is solitary left thyroid tissue.  Index lymph node in the upper mediastinum on sequence 2 image #8 measures 7 x 8 mm between the left common carotid artery and left subclavian artery.  These findings are not changed.  Overall, there is no significant chest lymphadenopathy.  No significant pericardial or pleural fluid.  Trachea and mainstem bronchi are patent.  Stable subcentimeter pulmonary nodule in the left upper lobe on sequence 4 image #19 measures 4 mm.  This small pulmonary nodule has been stable since 03/24/04 and likely benign.  Otherwise, there are no suspicious pulmonary nodules.  No acute bone abnormalities.
IMPRESSION: 1.  Stable chest CT.  No metastatic disease. 
 2.  Status-post right mastectomy.
 3.  Stable subcentimeter nodule in the left upper lung that appears benign based on stability. 
 ABDOMEN CT WITH CONTRAST:
FINDINGS: Low density throughout the liver parenchyma suggestive for fat infiltration.  The gallbladder, portovenous system, spleen, stomach, and duodenum are within normal limits.  Normal appearance of the pancreas, adrenal glands, and bowel structures.  Small ventral hernia containing fat without bowel involvement.  Stable 6 mm calcification in the midpole of the left kidney.  Stable calcification in the lower pole of the left kidney measuring 5 mm.  Patient has parapelvic cysts in the left kidney without hydronephrosis.  Contrast in the colon and the visualized bowel structures are grossly normal without an obstructive pattern.  No acute bone abnormalities.  Patient appears to have two left renal arteries which is a normal variant.
IMPRESSION: 1.  Stable CT of the abdomen without evidence of metastatic disease or acute findings.  
 2.  Stable left renal calcifications and left parapelvic cysts

## 2008-08-12 LAB — CONVERTED CEMR LAB
Calcium: 9.9 mg/dL (ref 8.4–10.5)
Glucose, Bld: 246 mg/dL — ABNORMAL HIGH (ref 70–99)
Phosphorus: 2.5 mg/dL (ref 2.3–4.6)
Potassium: 3.6 meq/L (ref 3.5–5.1)
Sodium: 142 meq/L (ref 135–145)
VLDL: 25.4 mg/dL (ref 0.0–40.0)

## 2008-08-24 IMAGING — CR DG PELVIS 1-2V
1 series · 1 of 1 positions shown · non-contrast
Comparison: None.

CLINICAL DATA: Left hip pain.
 PELVIS ? ONE VIEW:

[t pelvis a.p.]
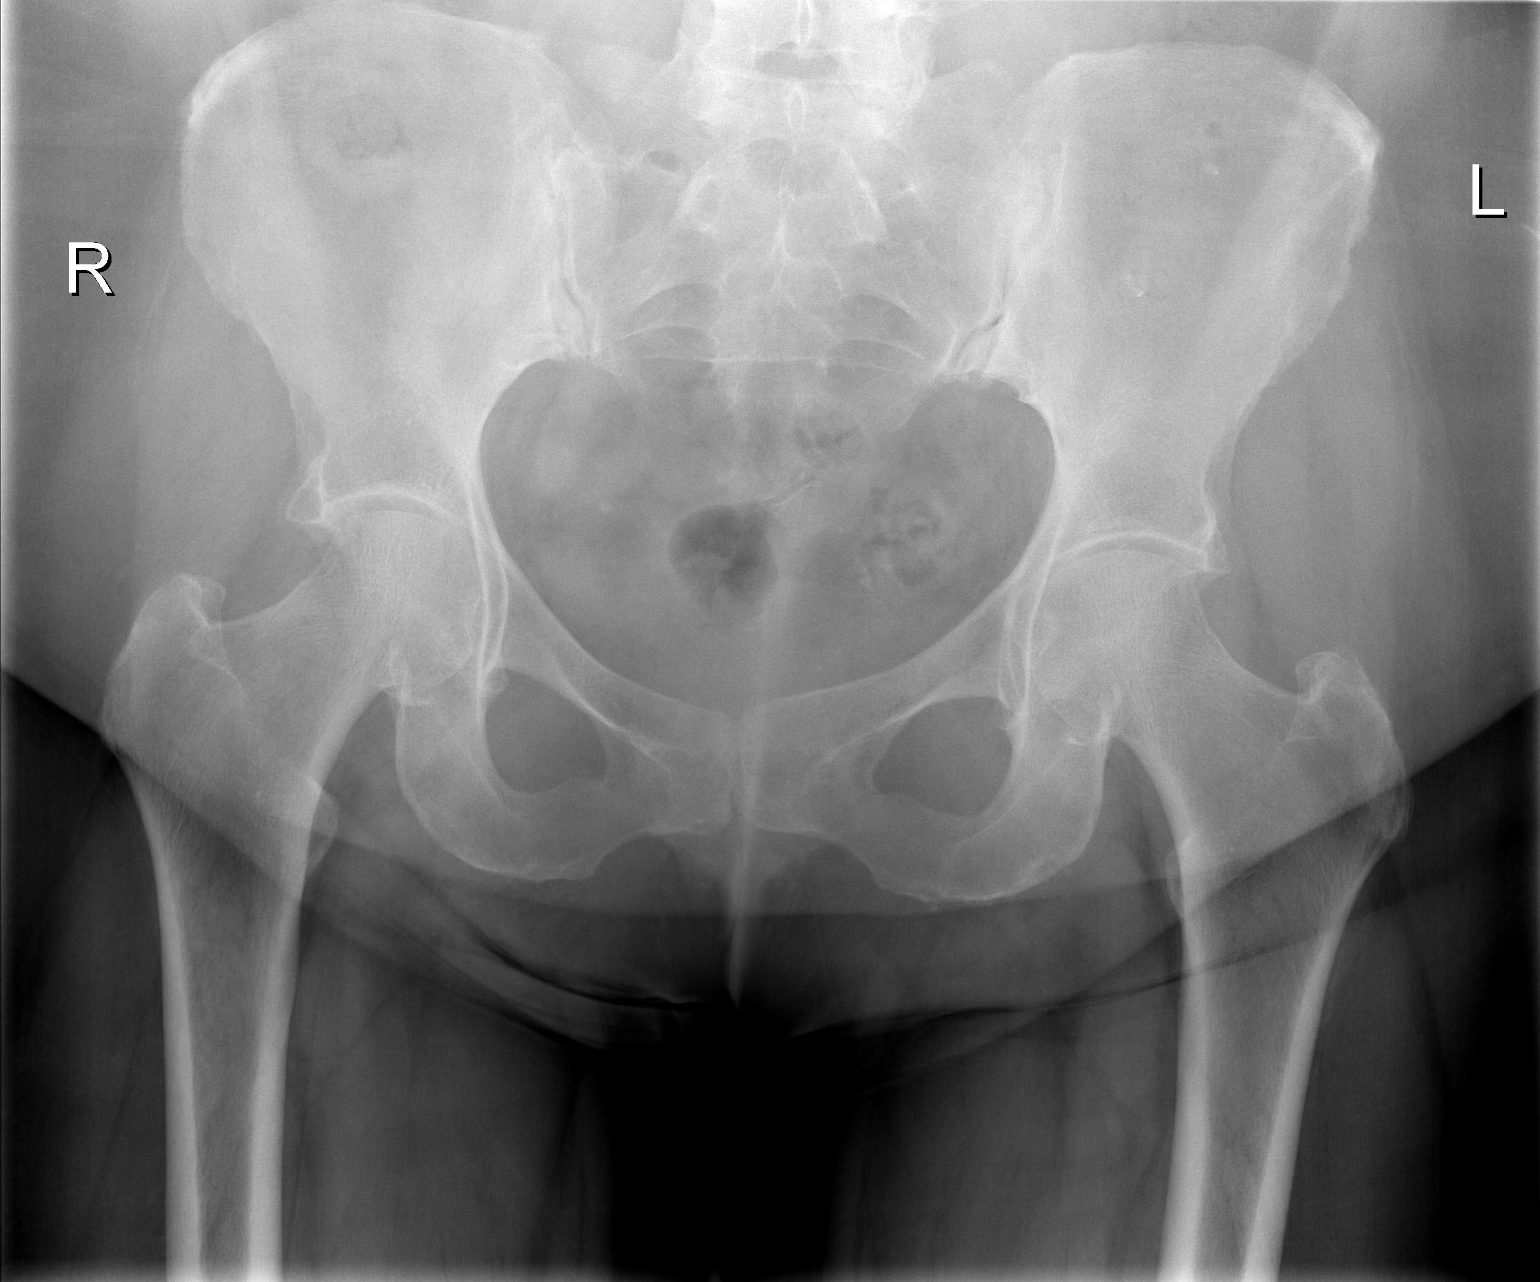

[1 of 1 positions shown; findings below may reference images not displayed]

FINDINGS: Mild bilateral hip joint space narrowing and osteophytosis.  No fracture or dislocation.
IMPRESSION: Mild bilateral hip osteoarthritis.

## 2008-09-16 DIAGNOSIS — G909 Disorder of the autonomic nervous system, unspecified: Secondary | ICD-10-CM | POA: Insufficient documentation

## 2008-10-29 ENCOUNTER — Encounter (INDEPENDENT_AMBULATORY_CARE_PROVIDER_SITE_OTHER): Payer: Self-pay | Admitting: *Deleted

## 2008-12-08 ENCOUNTER — Encounter: Payer: Self-pay | Admitting: Family Medicine

## 2008-12-19 ENCOUNTER — Ambulatory Visit: Payer: Self-pay | Admitting: Oncology

## 2008-12-24 LAB — CANCER ANTIGEN 27.29: CA 27.29: 26 U/mL (ref 0–39)

## 2008-12-24 LAB — CBC WITH DIFFERENTIAL (CANCER CENTER ONLY)
BASO#: 0 10*3/uL (ref 0.0–0.2)
EOS%: 3.1 % (ref 0.0–7.0)
LYMPH%: 37.5 % (ref 14.0–48.0)
MCH: 29.9 pg (ref 26.0–34.0)
MCHC: 32.7 g/dL (ref 32.0–36.0)
MONO%: 5.8 % (ref 0.0–13.0)
NEUT#: 2.6 10*3/uL (ref 1.5–6.5)
Platelets: 243 10*3/uL (ref 145–400)

## 2008-12-24 LAB — CMP (CANCER CENTER ONLY)
ALT(SGPT): 28 U/L (ref 10–47)
AST: 33 U/L (ref 11–38)
Alkaline Phosphatase: 70 U/L (ref 26–84)
CO2: 30 mEq/L (ref 18–33)
Creat: 0.5 mg/dl — ABNORMAL LOW (ref 0.6–1.2)
Sodium: 145 mEq/L (ref 128–145)
Total Bilirubin: 0.6 mg/dl (ref 0.20–1.60)
Total Protein: 7.9 g/dL (ref 6.4–8.1)

## 2009-01-01 ENCOUNTER — Ambulatory Visit: Payer: Self-pay | Admitting: Family Medicine

## 2009-01-02 ENCOUNTER — Encounter: Payer: Self-pay | Admitting: Family Medicine

## 2009-01-14 ENCOUNTER — Encounter: Payer: Self-pay | Admitting: Family Medicine

## 2009-01-27 ENCOUNTER — Ambulatory Visit: Payer: Self-pay | Admitting: Family Medicine

## 2009-02-05 ENCOUNTER — Ambulatory Visit: Payer: Self-pay | Admitting: Family Medicine

## 2009-02-05 LAB — CONVERTED CEMR LAB
Bilirubin Urine: NEGATIVE
Glucose, Urine, Semiquant: >=1000
pH: 6

## 2009-02-06 ENCOUNTER — Telehealth: Payer: Self-pay | Admitting: Family Medicine

## 2009-02-09 IMAGING — CT CT ABDOMEN W/ CM
2 of 5 series · 16 of 46 positions shown, 18 images · IV contrast (omnipaque)
Comparison: Prior examinations 10/20/05 and 04/19/06.

CLINICAL DATA: Breast cancer. Status-post surgery and chemotherapy. History of thyroidectomy. Right-sided chest and abdominal pain. 
 CHEST CT WITH CONTRAST:
TECHNIQUE: Multidetector CT imaging of the chest was performed following the standard protocol during bolus administration of intravenous contrast.
 Contrast: 100 cc Omnipaque 300. Oral contrast was given.
TECHNIQUE: Multidetector CT imaging of the abdomen was performed following the standard protocol during bolus administration of intravenous contrast.
 Contrast:  100 cc Omnipaque 300. Oral contrast was given.
TECHNIQUE: Multidetector CT imaging of the pelvis was performed following the standard protocol during bolus administration of intravenous contrast.

[Series 2: cap 5.0 b40f · axial · 0.79mm/px · z∈[-532,+23]mm · 13 of 127 slices shown, 15 images]
[im 8/127  soft-tissue]
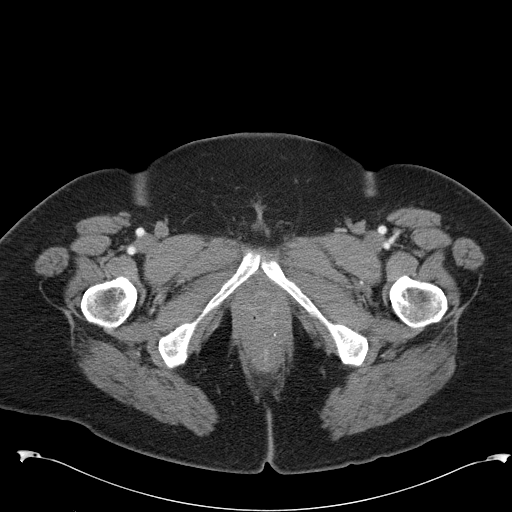
[im 8/127  bone]
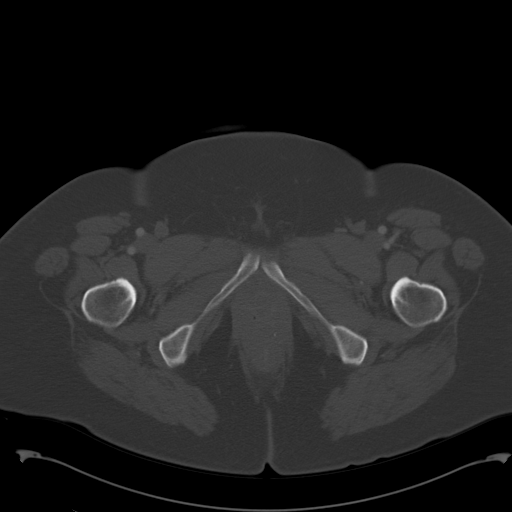
[im 15/127  soft-tissue]
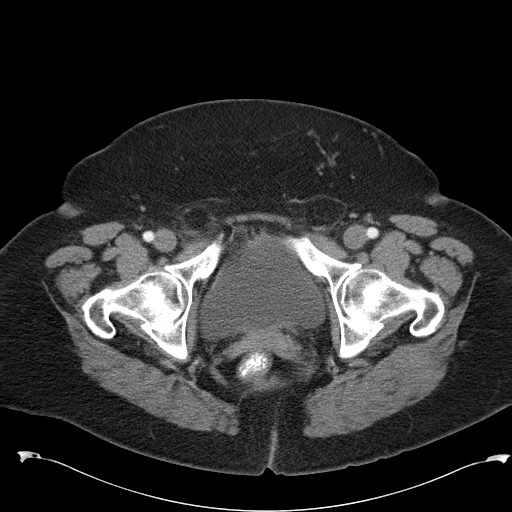
[im 30/127  soft-tissue]
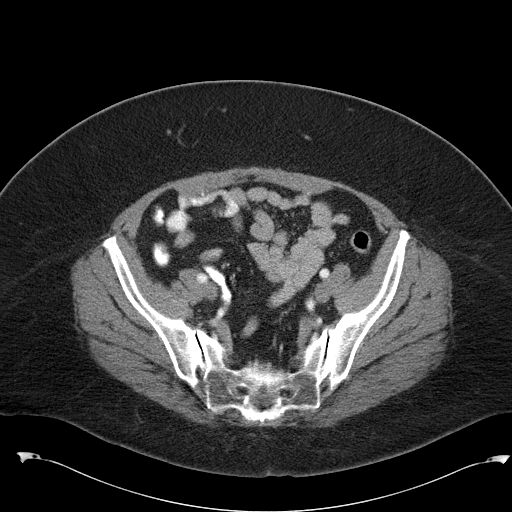
[im 38/127  soft-tissue]
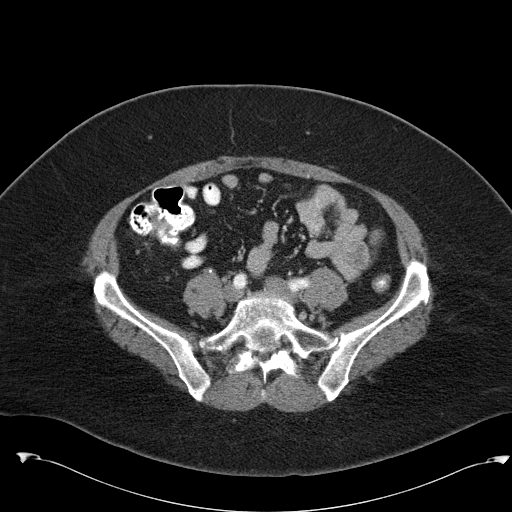
[im 45/127  soft-tissue]
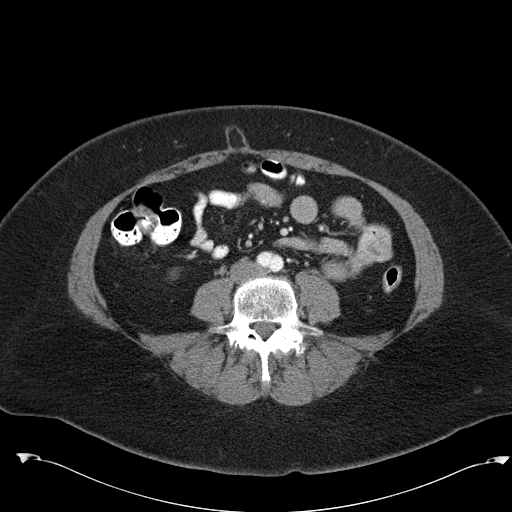
[im 52/127  soft-tissue]
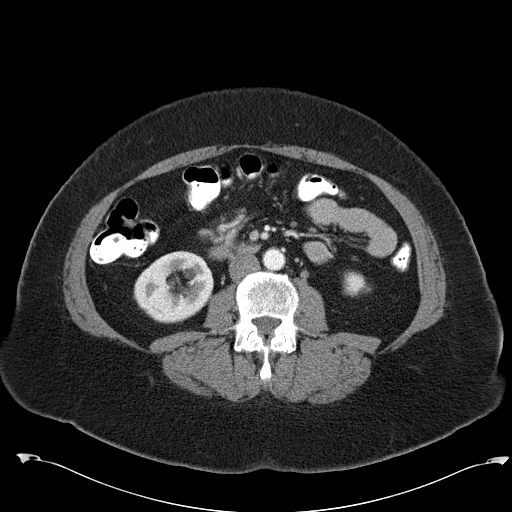
[im 67/127  soft-tissue]
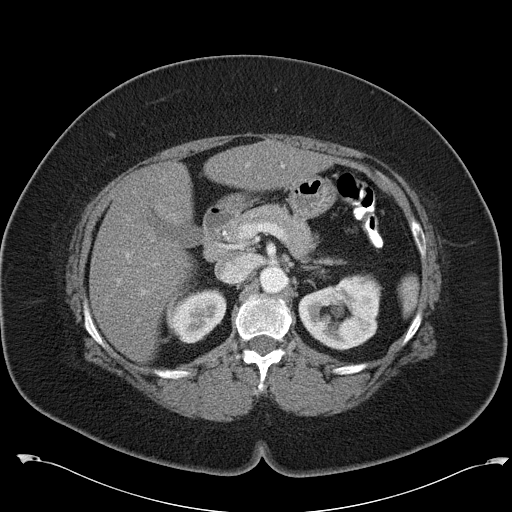
[im 75/127  soft-tissue]
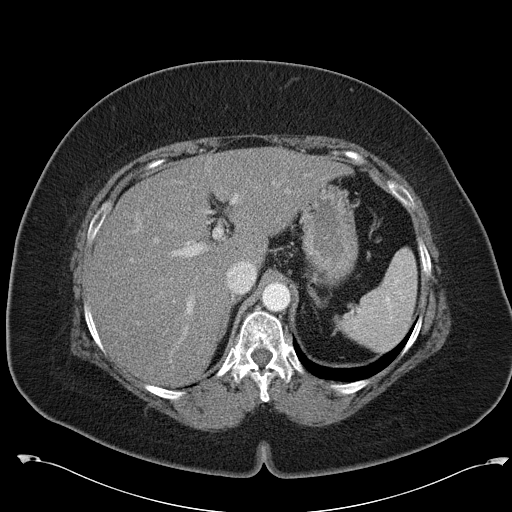
[im 82/127  soft-tissue]
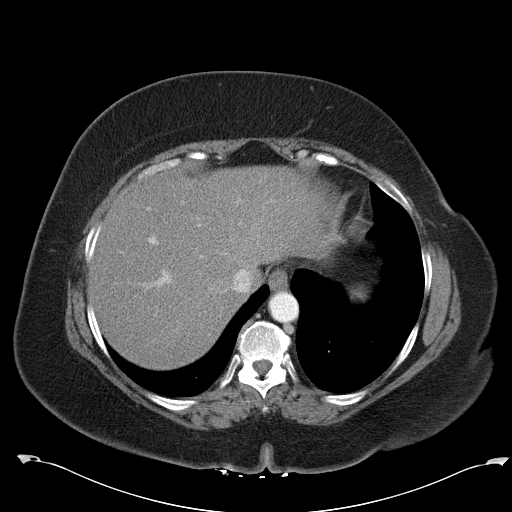
[im 82/127  bone]
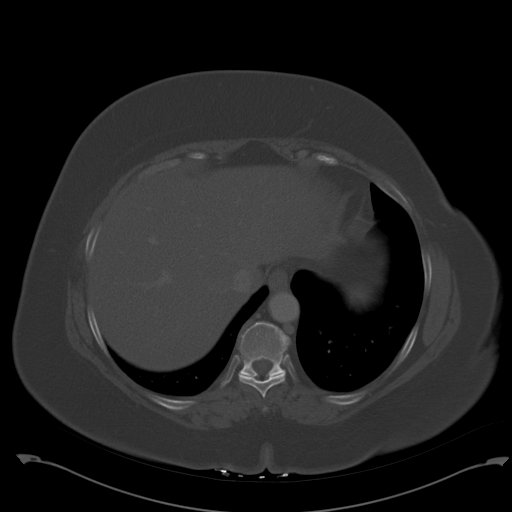
[im 89/127  soft-tissue]
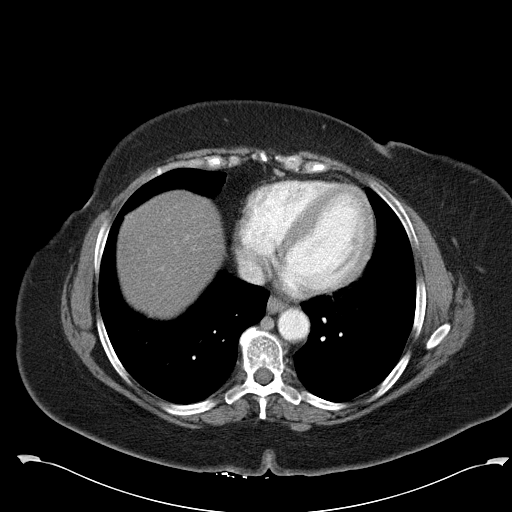
[im 97/127  soft-tissue]
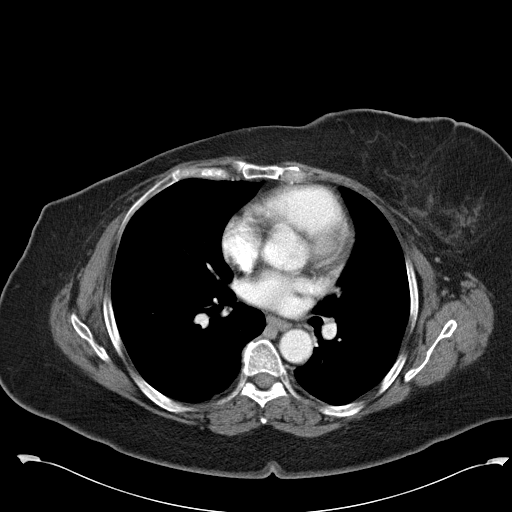
[im 112/127  soft-tissue]
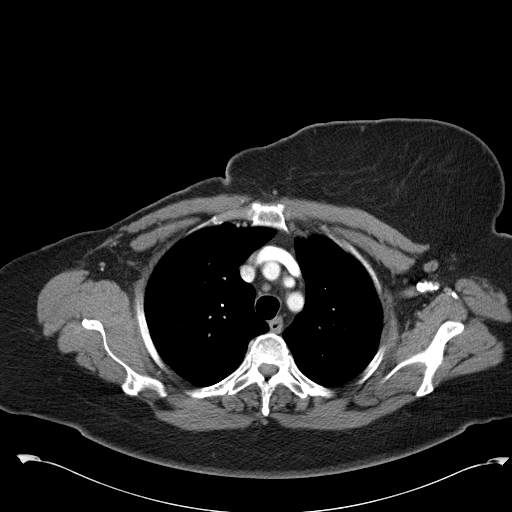
[im 119/127  soft-tissue]
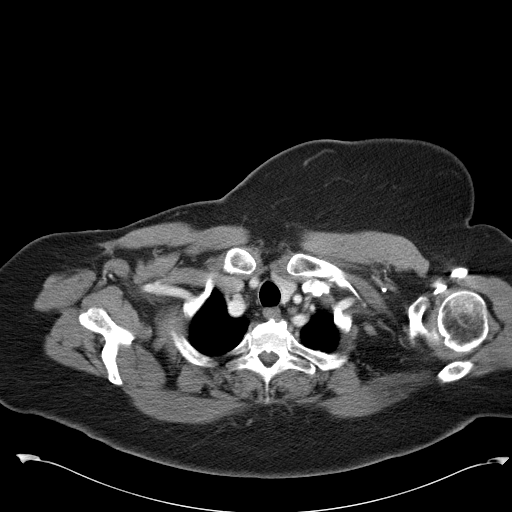

[Series 602: <mpr thick range> · coronal · 1.24mm/px · 3 of 81 slices shown]
[im 27/81  soft-tissue]
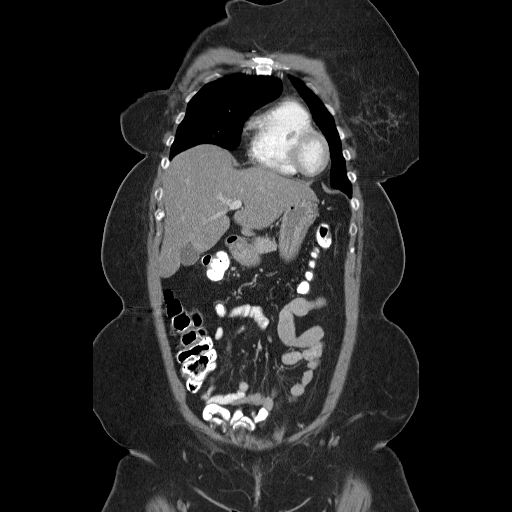
[im 36/81  soft-tissue]
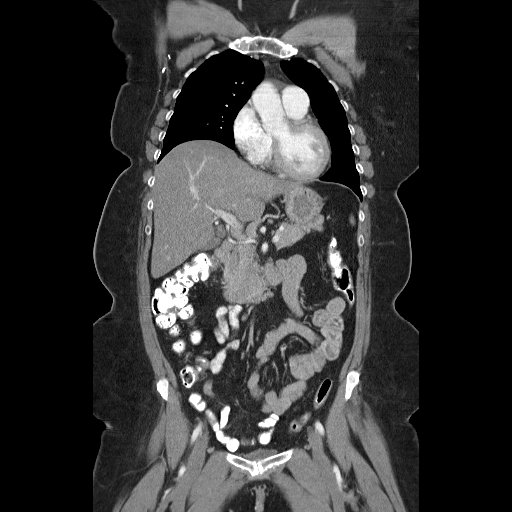
[im 45/81  soft-tissue]
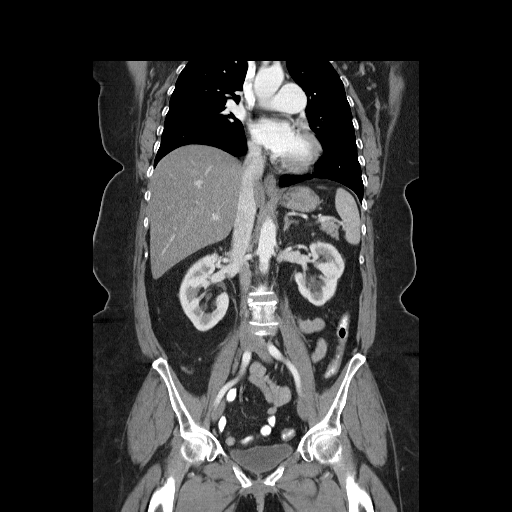

[16 of 46 positions shown; findings below may reference images not displayed]

FINDINGS: There are stable postsurgical changes related to right mastectomy and axillary node dissection. Small lymph nodes in the left axilla appear stable. There are no enlarged axillary, mediastinal, hilar, or internal mammary lymph nodes. There is no pleural or pericardial effusion. The patient is status-post right thyroid resection. 
 A 4 mm left upper lobe nodule on image 21 is unchanged. There are no new or enlarging pulmonary nodules. The lungs are otherwise clear. There are no suspicious osseous lesions.
IMPRESSION: Stable CT of the chest. No evidence of local recurrence or metastatic disease. A tiny left upper lobe nodule is unchanged. 
 ABDOMEN CT WITH CONTRAST:
FINDINGS: There is stable fatty infiltration of the liver. No focal liver lesions are identified. The spleen, pancreas, gallbladder, and adrenal glands appear normal. The kidneys appear stable with two nonobstructing left renal calculi and bilateral parapelvic cysts. There is no hydronephrosis.  Mildly prominent portocaval lymph nodes appear unchanged. There are no suspicious osseous lesions.
IMPRESSION: Stable CT of the abdomen. No evidence of metastatic disease. Left renal calculi are again noted.
 PELVIS CT WITH CONTRAST:
FINDINGS: Small umbilical hernia containing only fat appears stable. There is no pelvic mass, fluid collection, or inflammatory process. No enlarged pelvic lymph nodes are seen.  The patient is status-post hysterectomy. No suspicious osseous lesions are seen.  A small sclerotic lesion in the left aspect of the sacrum on image 94 is unchanged from a pelvic CT done 12/29/04.  There are moderate lower lumbar spine facet degenerative changes.
IMPRESSION: Stable examination. No evidence of metastatic disease.

## 2009-02-11 ENCOUNTER — Telehealth: Payer: Self-pay | Admitting: Family Medicine

## 2009-03-18 ENCOUNTER — Encounter: Admission: RE | Admit: 2009-03-18 | Discharge: 2009-03-18 | Payer: Self-pay | Admitting: Family Medicine

## 2009-03-18 ENCOUNTER — Ambulatory Visit: Payer: Self-pay | Admitting: Family Medicine

## 2009-03-18 DIAGNOSIS — M549 Dorsalgia, unspecified: Secondary | ICD-10-CM | POA: Insufficient documentation

## 2009-03-19 LAB — CONVERTED CEMR LAB
ALT: 28 units/L (ref 0–35)
Albumin: 4.1 g/dL (ref 3.5–5.2)
Alkaline Phosphatase: 63 units/L (ref 39–117)
Bilirubin, Direct: 0.1 mg/dL (ref 0.0–0.3)
Calcium: 10.4 mg/dL (ref 8.4–10.5)
Chloride: 105 meq/L (ref 96–112)
Cholesterol: 183 mg/dL (ref 0–200)
Potassium: 3.8 meq/L (ref 3.5–5.1)
Total Protein: 7.7 g/dL (ref 6.0–8.3)

## 2009-03-23 ENCOUNTER — Encounter: Payer: Self-pay | Admitting: Family Medicine

## 2009-04-24 ENCOUNTER — Encounter: Payer: Self-pay | Admitting: Family Medicine

## 2009-05-01 ENCOUNTER — Encounter: Payer: Self-pay | Admitting: Family Medicine

## 2009-05-08 ENCOUNTER — Encounter (INDEPENDENT_AMBULATORY_CARE_PROVIDER_SITE_OTHER): Payer: Self-pay | Admitting: *Deleted

## 2009-06-04 IMAGING — CR DG HIP COMPLETE 2+V*R*
3 series · 3 of 3 positions shown · non-contrast
Comparison: none

CLINICAL DATA: Right hip pain

RIGHT HIP - 2 VIEW

[t pelvis a.p.]
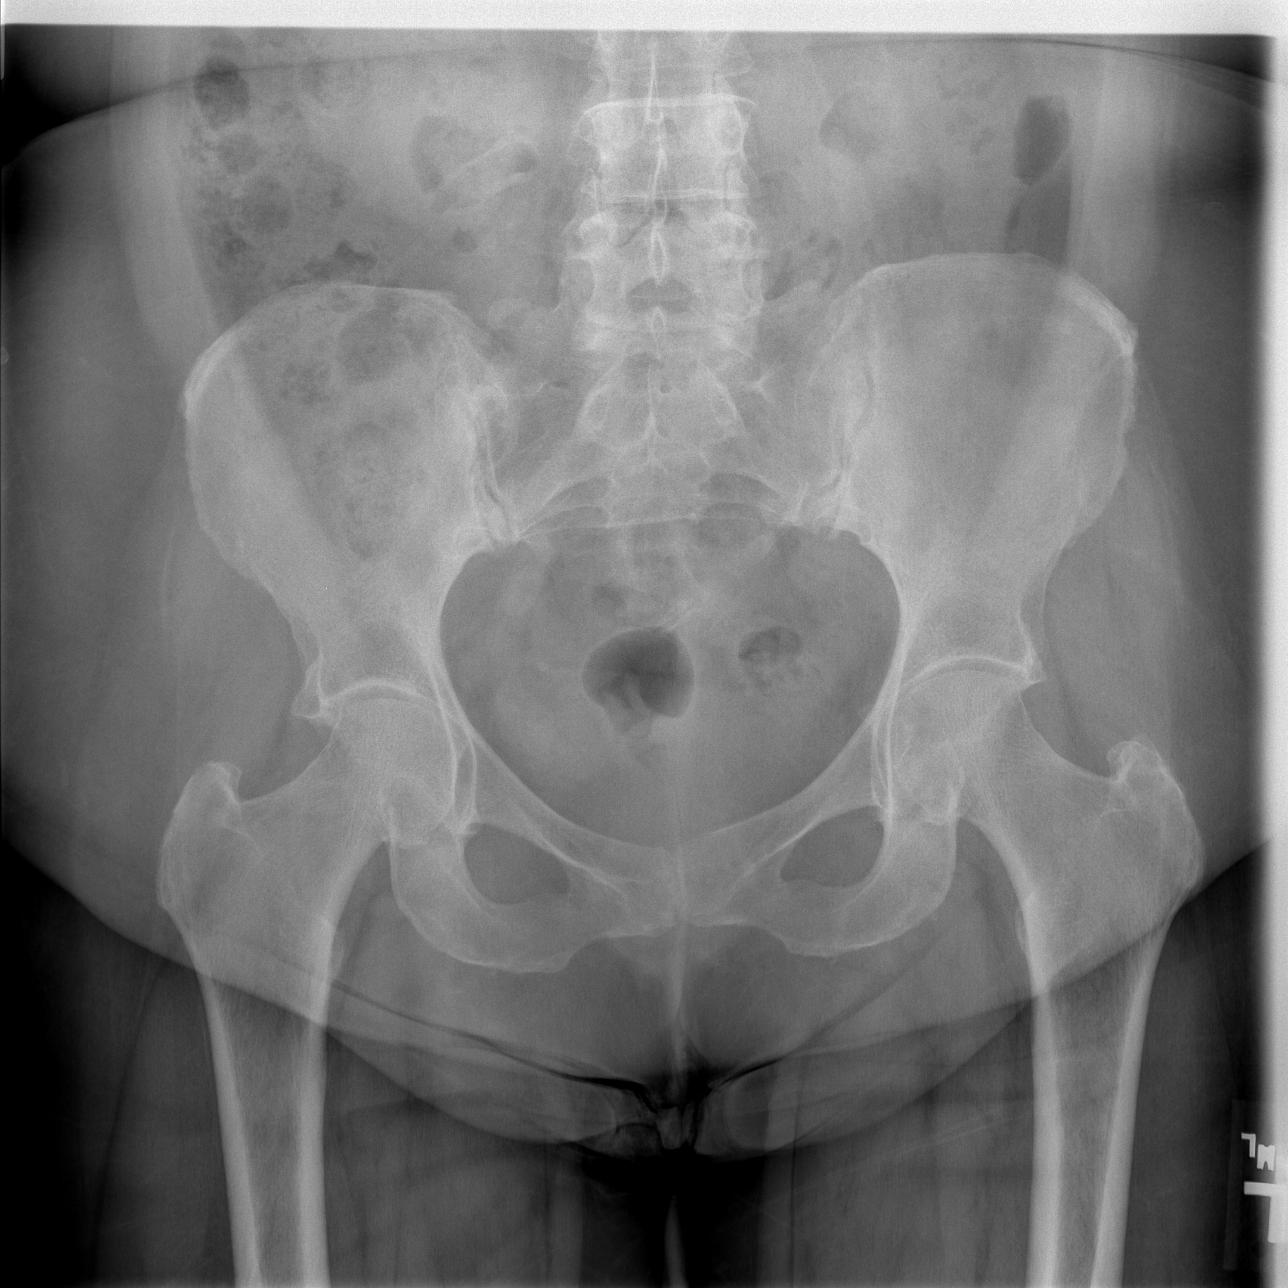

[t hip ap right]
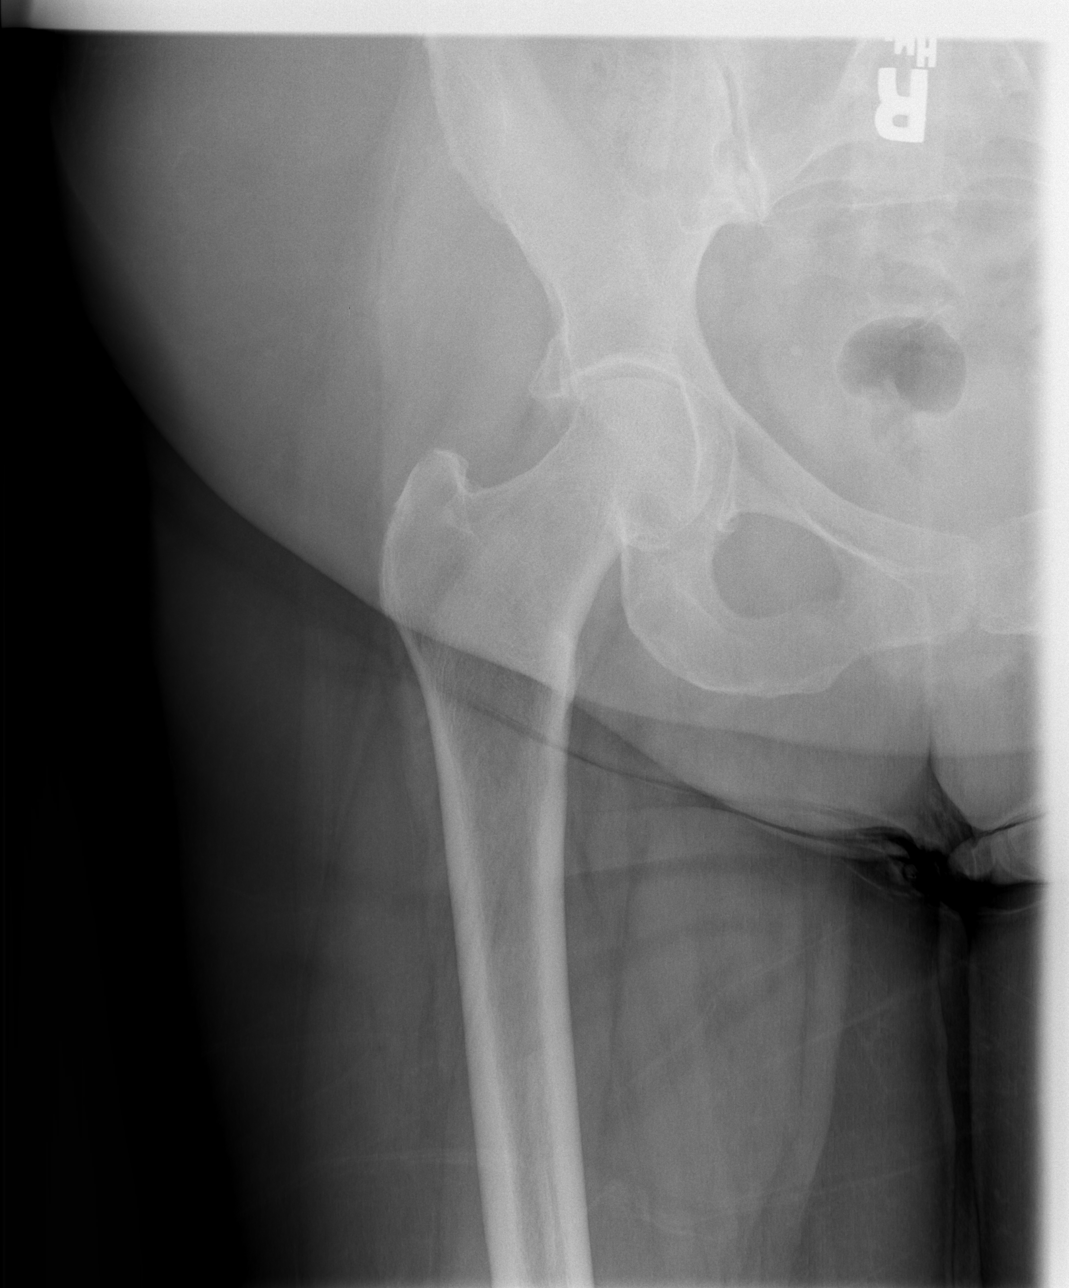

[t hip frog leg right]
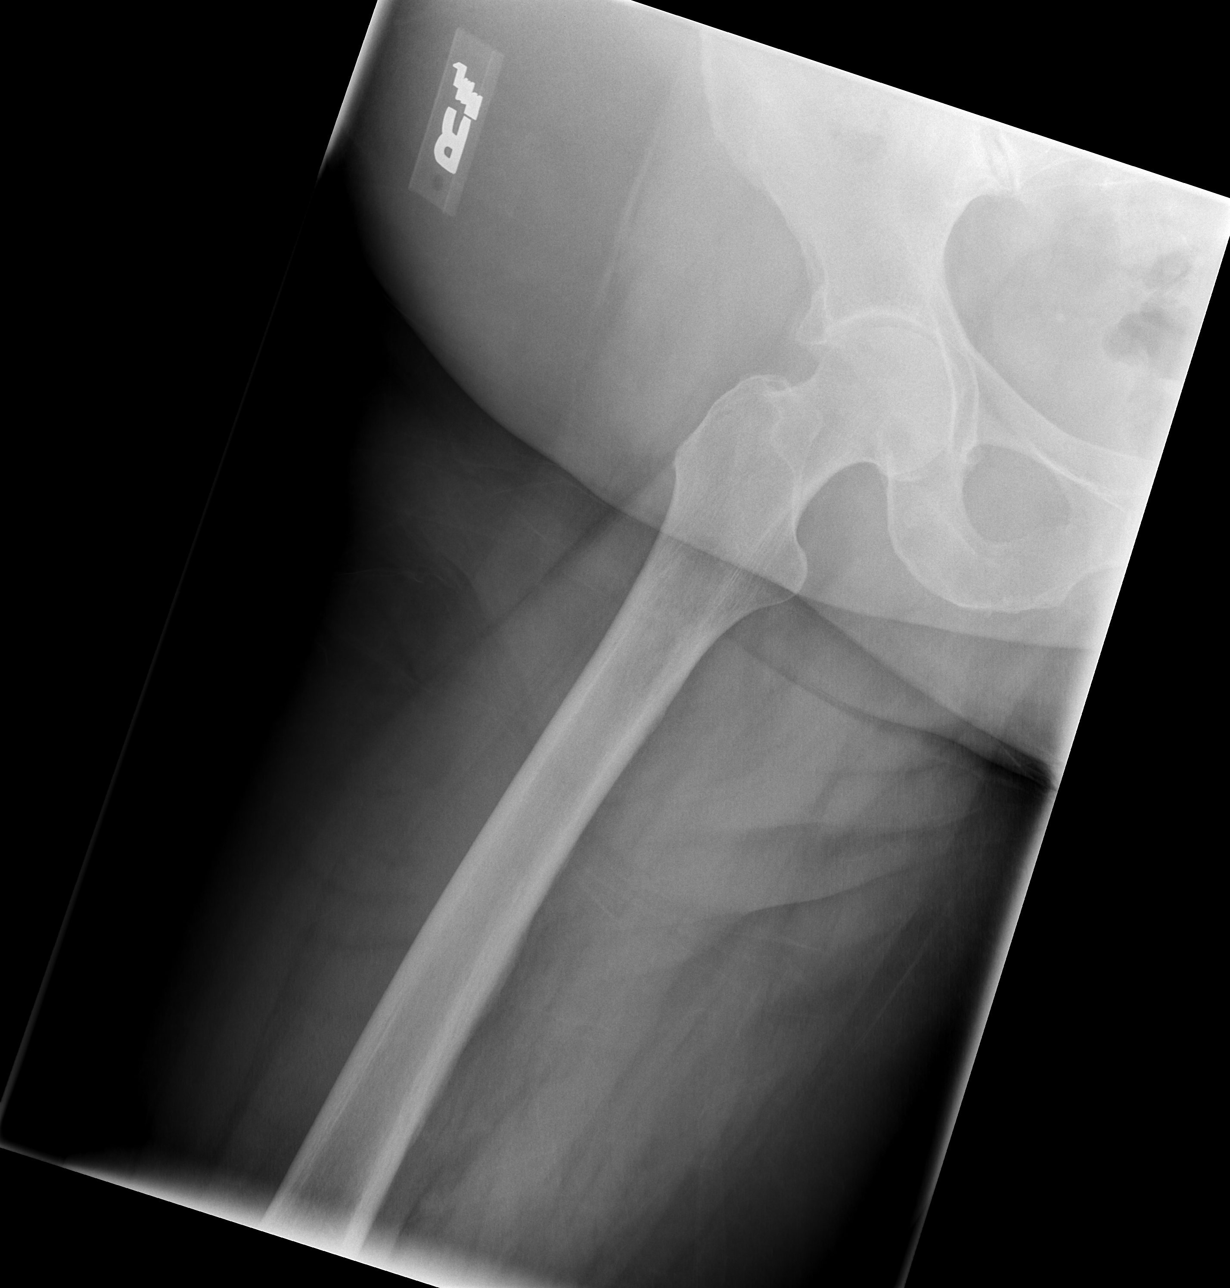

[3 of 3 positions shown; findings below may reference images not displayed]

FINDINGS: Early degenerative changes seen in the hips bilaterally. No acute
bony abnormality. No fracture, subluxation, or dislocation. SI joints are
symmetric.

IMPRESSION

Early degenerative changes. No acute findings.

## 2009-07-09 ENCOUNTER — Ambulatory Visit: Payer: Self-pay | Admitting: Family Medicine

## 2009-08-09 IMAGING — CT CT CHEST W/ CM
2 of 6 series · 17 of 46 positions shown, 19 images · IV contrast (omnipaque)
Comparison: 10/19/2006

CHEST CT WITH CONTRAST

CLINICAL DATA: Breast cancer. Recurrences in 6224 and 5550. Chronic
constipation.
TECHNIQUE: Multidetector CT imaging of the chest, abdomen, and pelvis was
performed following the standard protocol during bolus administration of
intravenous contrast.

Contrast:  100 cc Omnipaque 300

[Series 2: cap 5.0 b40f · axial · 0.80mm/px · z∈[-603,-58]mm · 14 of 125 slices shown, 16 images]
[im 8/125  soft-tissue]
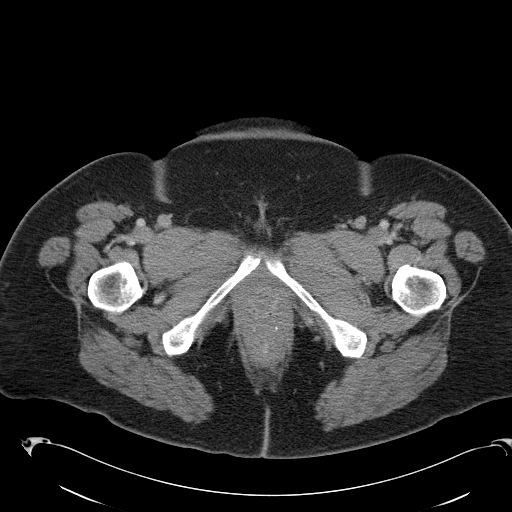
[im 8/125  bone]
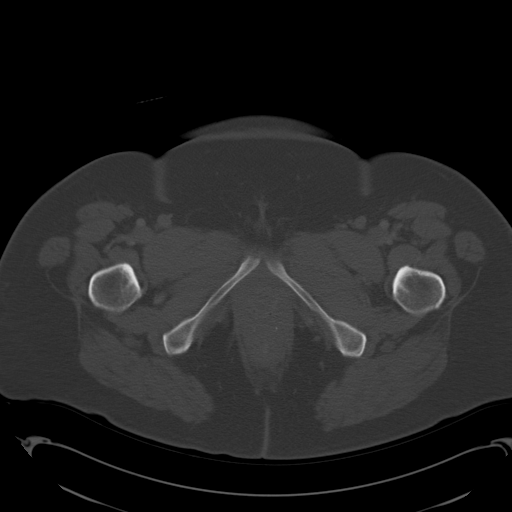
[im 16/125  soft-tissue]
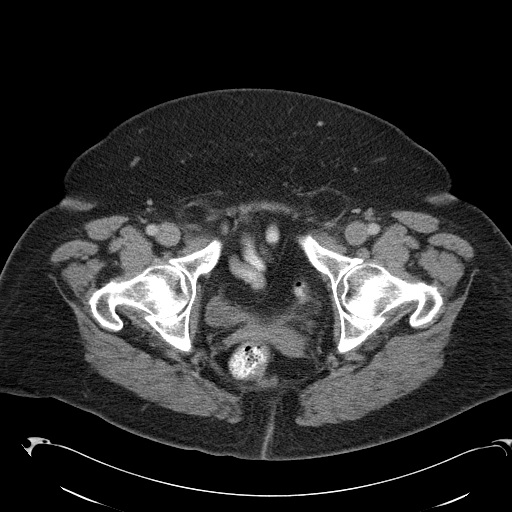
[im 24/125  soft-tissue]
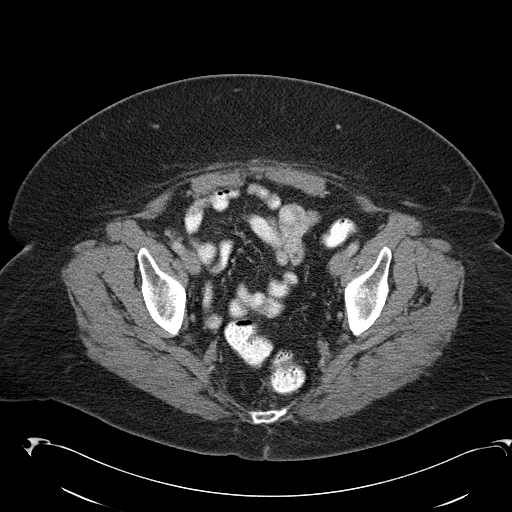
[im 32/125  soft-tissue]
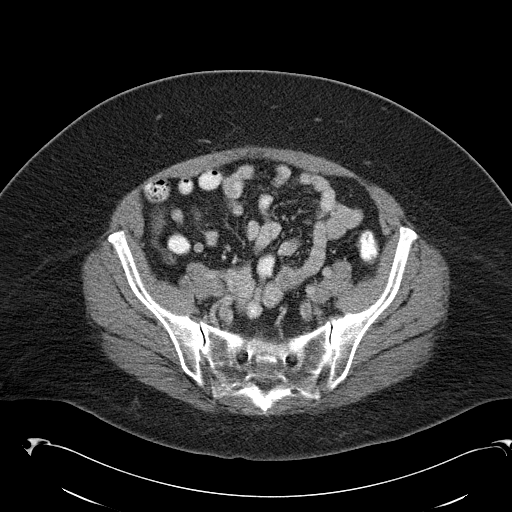
[im 39/125  soft-tissue]
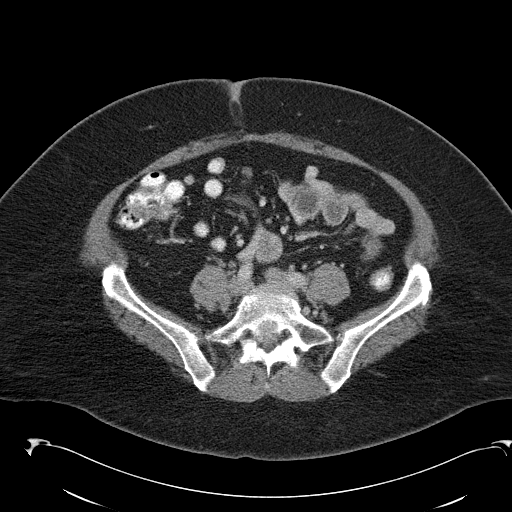
[im 47/125  soft-tissue]
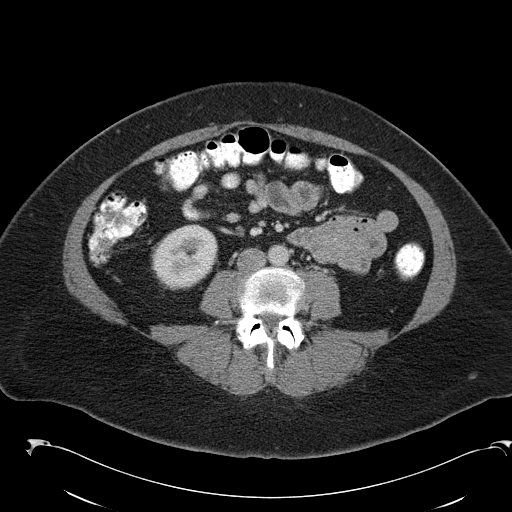
[im 55/125  soft-tissue]
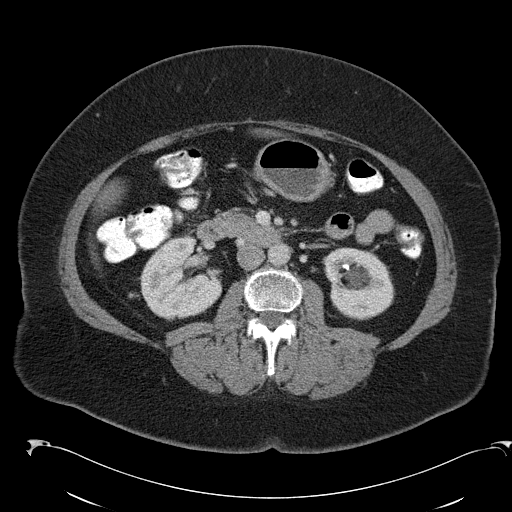
[im 70/125  soft-tissue]
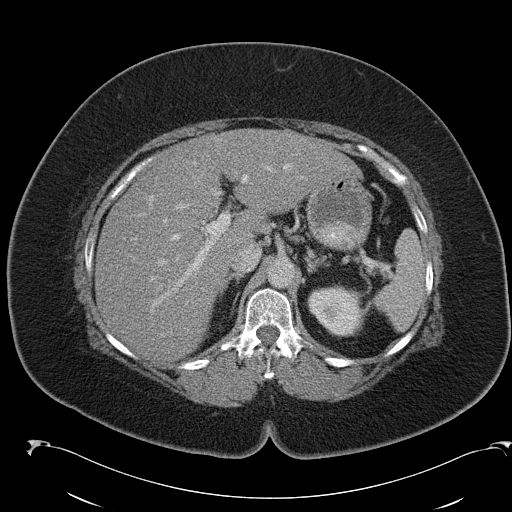
[im 78/125  soft-tissue]
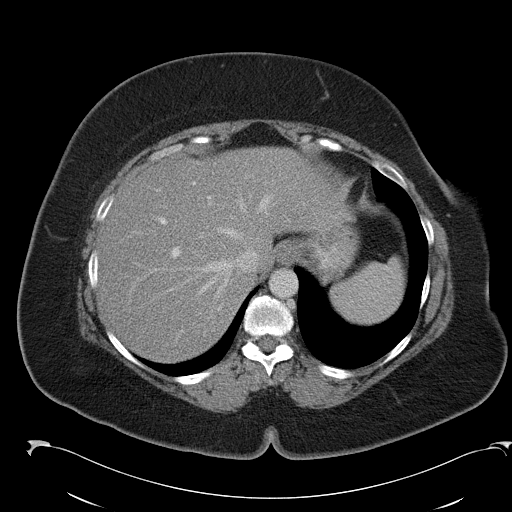
[im 78/125  bone]
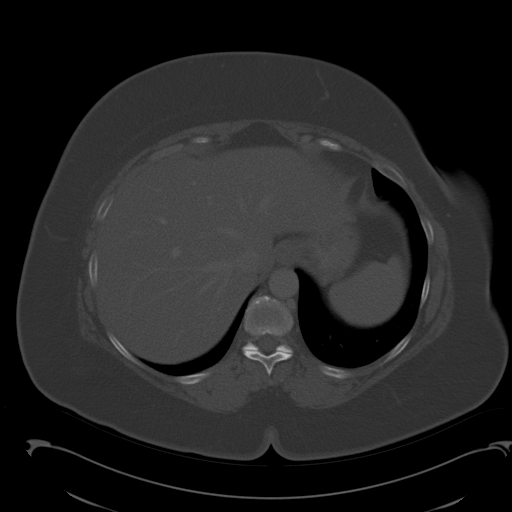
[im 86/125  soft-tissue]
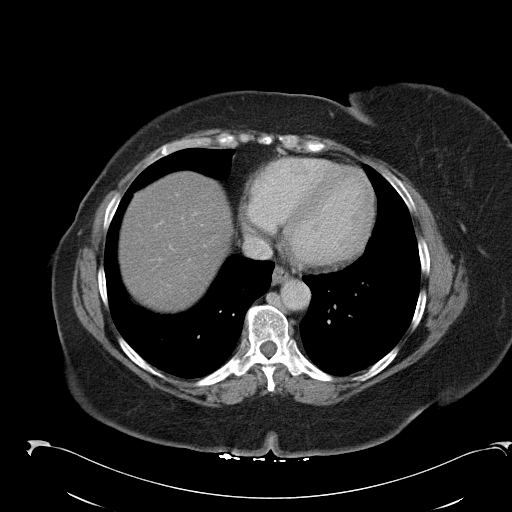
[im 94/125  soft-tissue]
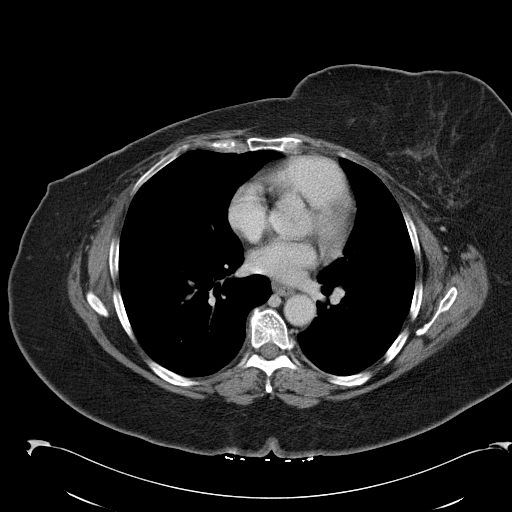
[im 101/125  soft-tissue]
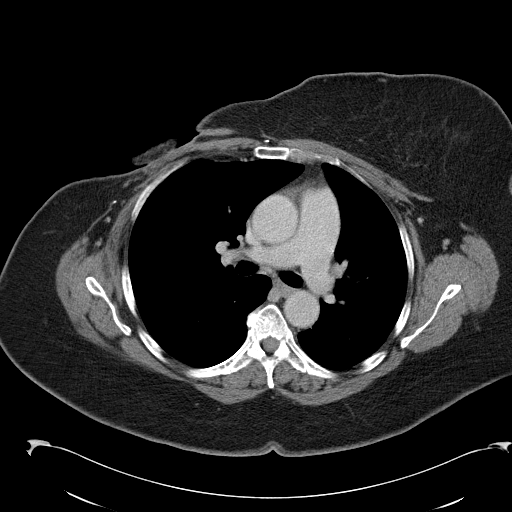
[im 109/125  soft-tissue]
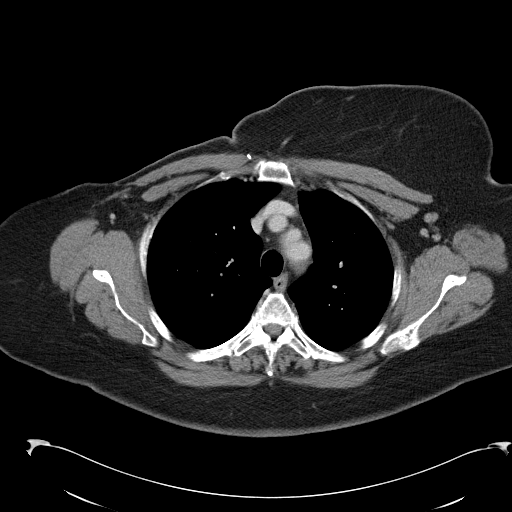
[im 117/125  soft-tissue]
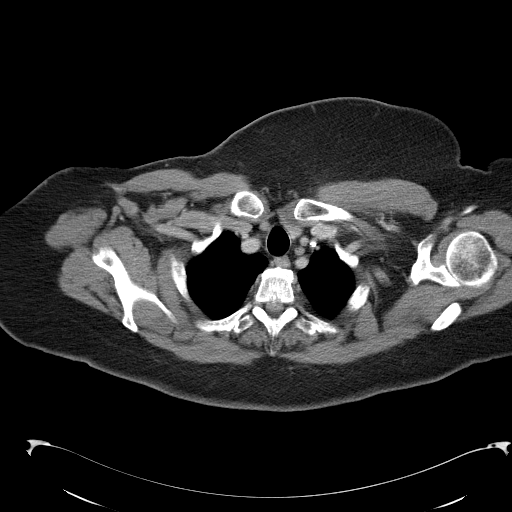

[Series 602: <mpr thick range> · coronal · 1.22mm/px · 3 of 99 slices shown]
[im 33/99  soft-tissue]
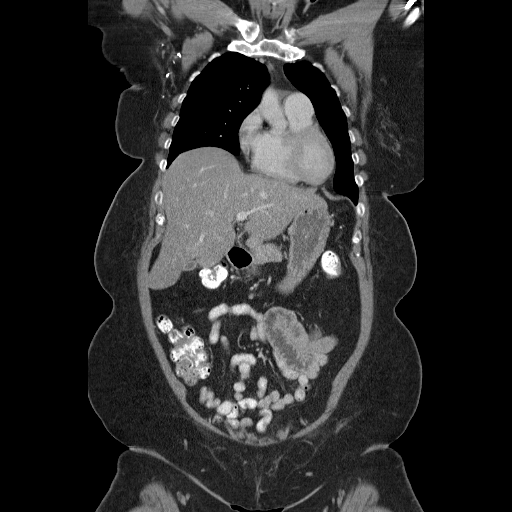
[im 44/99  soft-tissue]
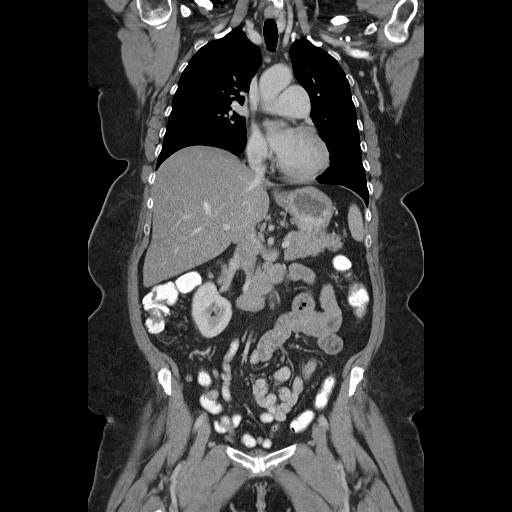
[im 55/99  soft-tissue]
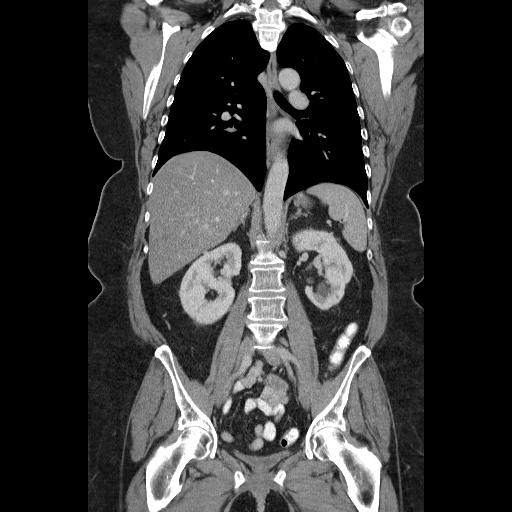

[17 of 46 positions shown; findings below may reference images not displayed]

FINDINGS: Right thyroidectomy noted. Right mastectomy noted.

No pathologically enlarged hilar or mediastinal adenopathy is identified. No
pathologic axillary or subpectoral adenopathy.

A 3 mm nodule in the left upper lobe on image 20 of series 4 is stable for years
and is benign. No new nodule identified.

IMPRESSION

Stable CT appearance of the chest, without findings of malignancy.

ABDOMEN CT WITH CONTRAST
FINDINGS: The liver, spleen, pancreas, and adrenal glands appear unremarkable.
The gallbladder is contracted but otherwise unremarkable. Small porta hepatis
nodes do not appear pathologically enlarged by size criteria. No pathologic
retroperitoneal adenopathy. A small umbilical hernia contains adipose tissue. 

Stable left nephrolithiasis is present. Small parapelvic cysts are present, left
greater than right. No hydronephrosis or hydroureter. No pathologic
retroperitoneal adenopathy.

IMPRESSION

1. Stable CT appearance of the abdomen, with left nephrolithiasis but no
metastatic disease to the abdomen noted.

PELVIS CT WITH CONTRAST
FINDINGS: Small bilateral inguinal hernias containing adipose tissue. Urinary
bladder appears unremarkable. No free pelvic fluid or pelvic mass. No pathologic
pelvic adenopathy is identified.

IMPRESSION

1. No specific findings of metastatic disease to the pelvis.

## 2009-08-10 ENCOUNTER — Ambulatory Visit: Payer: Self-pay | Admitting: Oncology

## 2009-08-14 LAB — CBC WITH DIFFERENTIAL (CANCER CENTER ONLY)
BASO#: 0.1 10*3/uL (ref 0.0–0.2)
BASO%: 1.1 % (ref 0.0–2.0)
EOS%: 2.4 % (ref 0.0–7.0)
HCT: 41.9 % (ref 34.8–46.6)
HGB: 14.5 g/dL (ref 11.6–15.9)
LYMPH#: 2.8 10*3/uL (ref 0.9–3.3)
MCH: 30.3 pg (ref 26.0–34.0)
MCHC: 34.7 g/dL (ref 32.0–36.0)
MONO%: 6.5 % (ref 0.0–13.0)
NEUT#: 3.6 10*3/uL (ref 1.5–6.5)
NEUT%: 50 % (ref 39.6–80.0)
RDW: 11.9 % (ref 10.5–14.6)

## 2009-08-14 LAB — CMP (CANCER CENTER ONLY)
ALT(SGPT): 32 U/L (ref 10–47)
AST: 32 U/L (ref 11–38)
Alkaline Phosphatase: 65 U/L (ref 26–84)
BUN, Bld: 11 mg/dL (ref 7–22)
Chloride: 99 mEq/L (ref 98–108)
Creat: 0.4 mg/dl — ABNORMAL LOW (ref 0.6–1.2)

## 2009-08-18 ENCOUNTER — Encounter: Payer: Self-pay | Admitting: Family Medicine

## 2009-08-18 IMAGING — CT CT PELVIS W/O CM
2 of 4 series · 17 of 46 positions shown, 19 images · non-contrast
Comparison: 04/17/2006

CLINICAL DATA: Right-sided pain. Hematuria. History of right breast cancer.

ABDOMEN CT WITHOUT CONTRAST - URINARY STONE PROTOCOL
TECHNIQUE: Multidetector CT imaging of the abdomen was performed following the
urinary stone protocol.  No oral or intravenous contrast was administered.
TECHNIQUE: Multidetector CT imaging of the pelvis was performed following the

[Series 2: stone_wo 5.0 b10f st · axial · 0.93mm/px · z∈[-428,-32]mm · 14 of 109 slices shown, 16 images]
[im 5/109  soft-tissue]
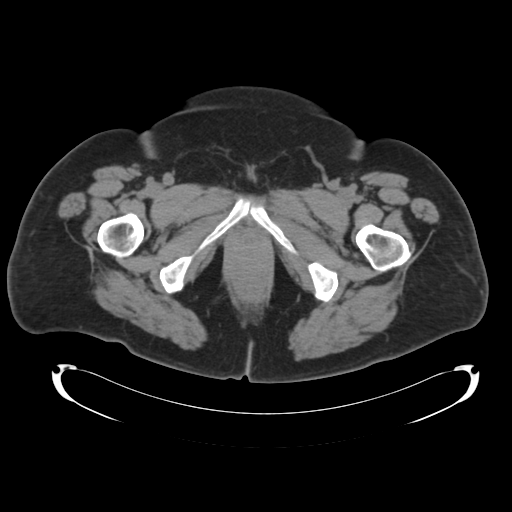
[im 5/109  bone]
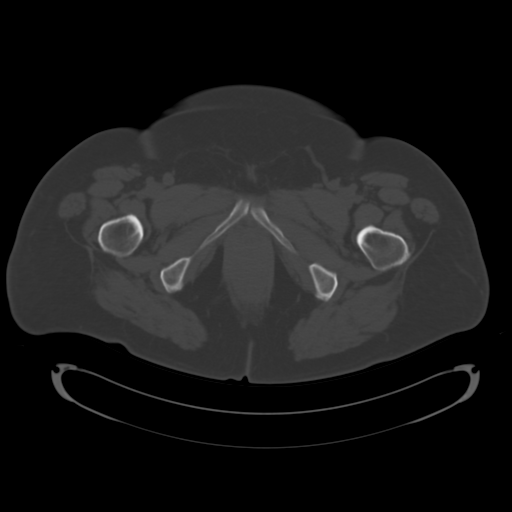
[im 14/109  soft-tissue]
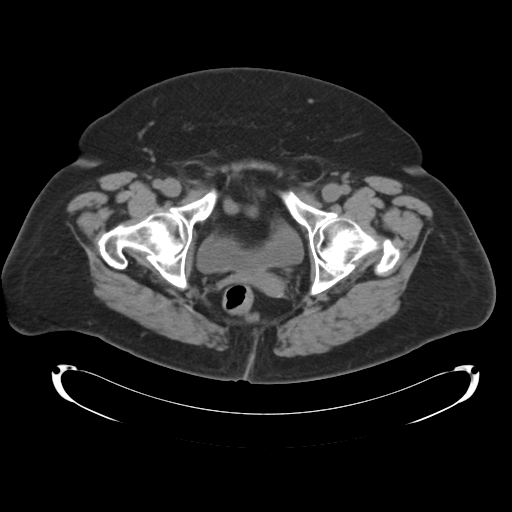
[im 23/109  soft-tissue]
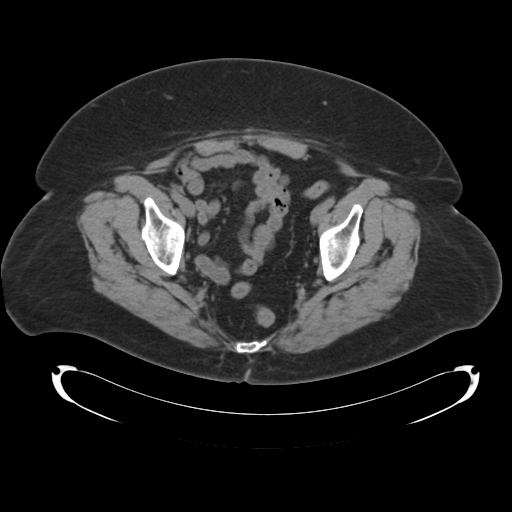
[im 28/109  soft-tissue]
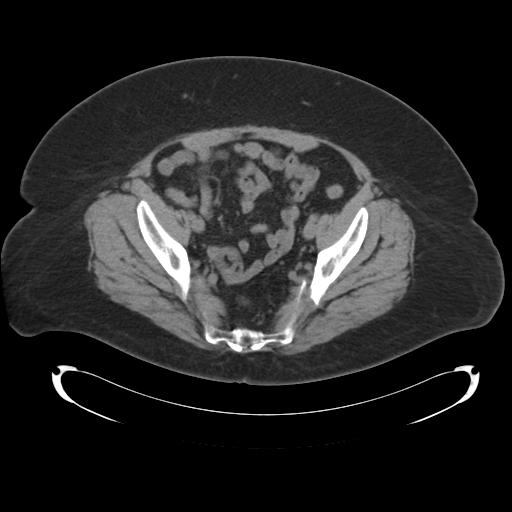
[im 37/109  soft-tissue]
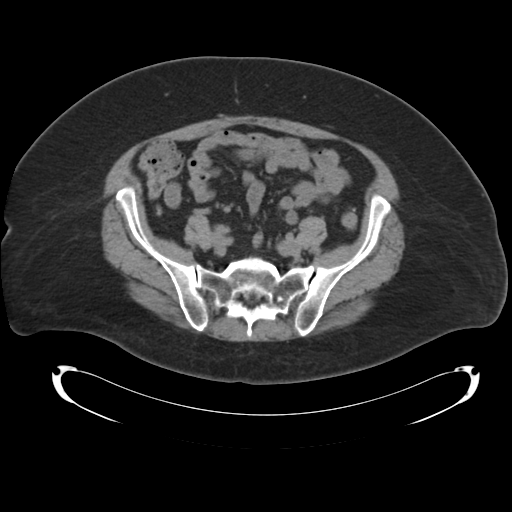
[im 46/109  soft-tissue]
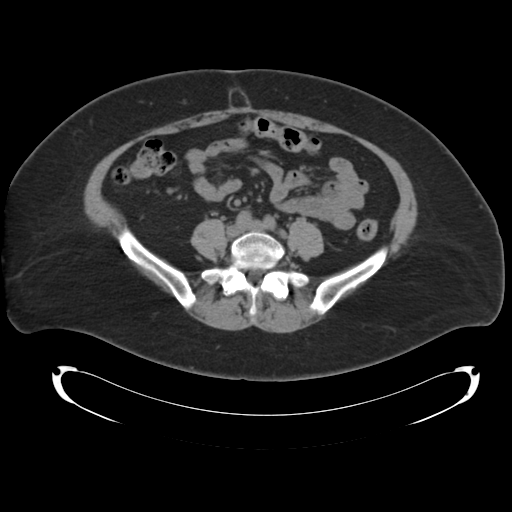
[im 50/109  soft-tissue]
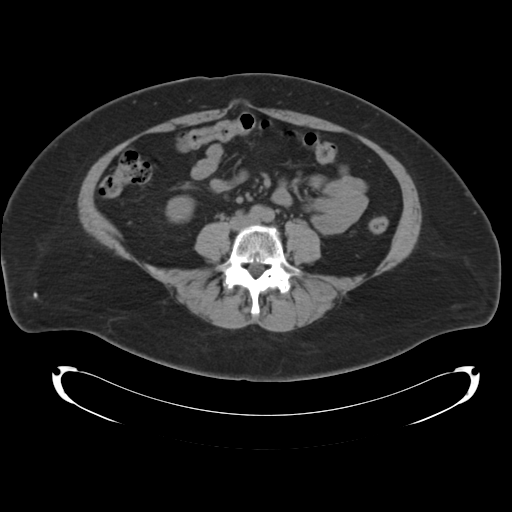
[im 59/109  soft-tissue]
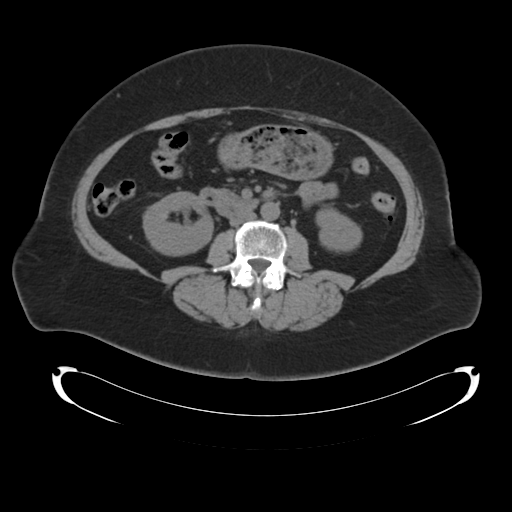
[im 64/109  soft-tissue]
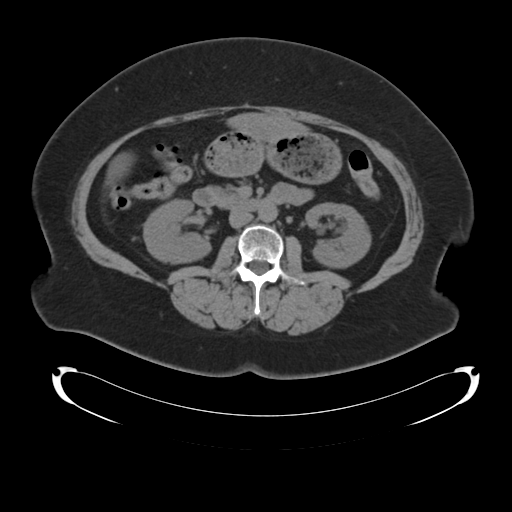
[im 64/109  bone]
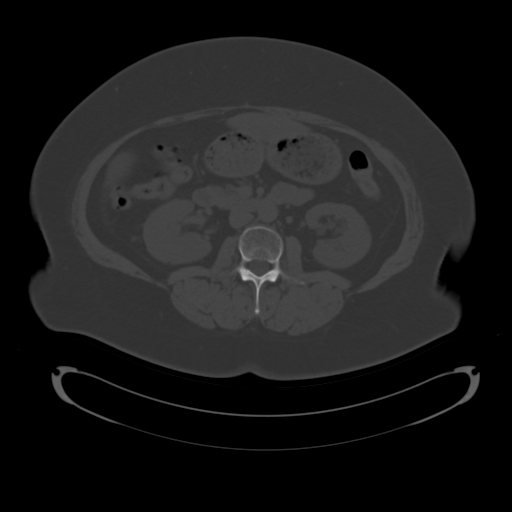
[im 73/109  soft-tissue]
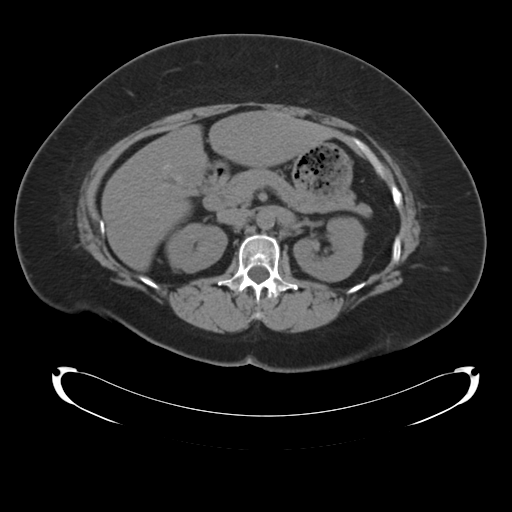
[im 82/109  soft-tissue]
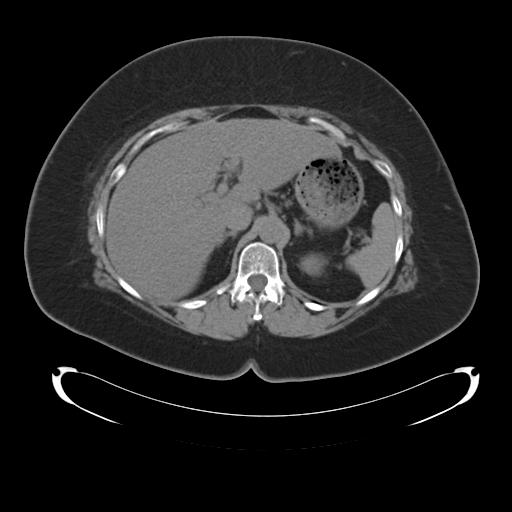
[im 86/109  soft-tissue]
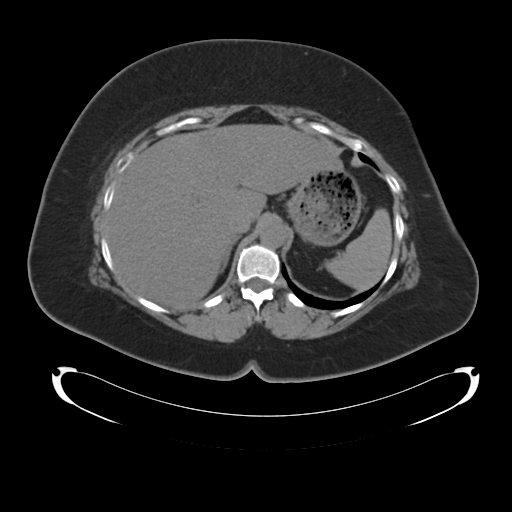
[im 95/109  soft-tissue]
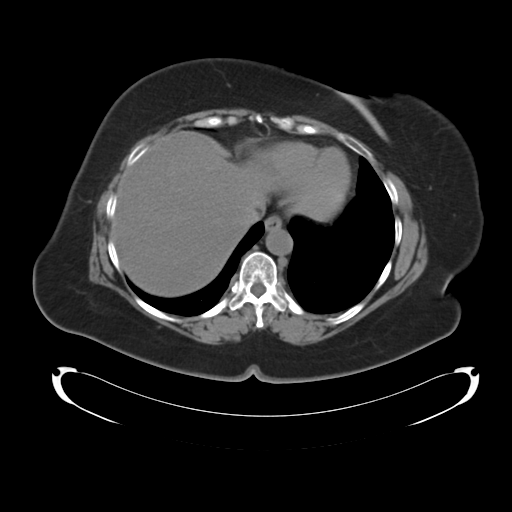
[im 104/109  soft-tissue]
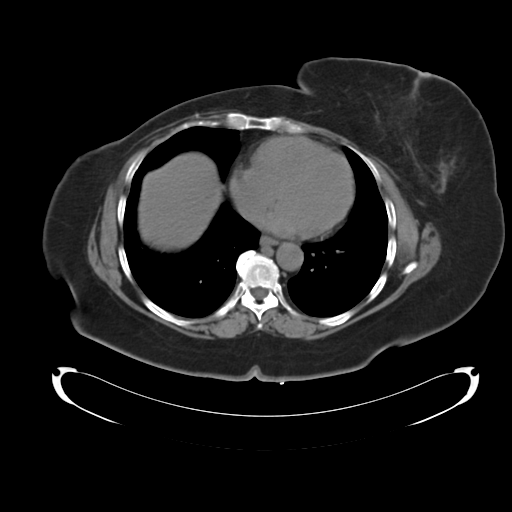

[Series 602: <mpr thick range> · coronal · 0.93mm/px · 3 of 83 slices shown]
[im 28/83  soft-tissue]
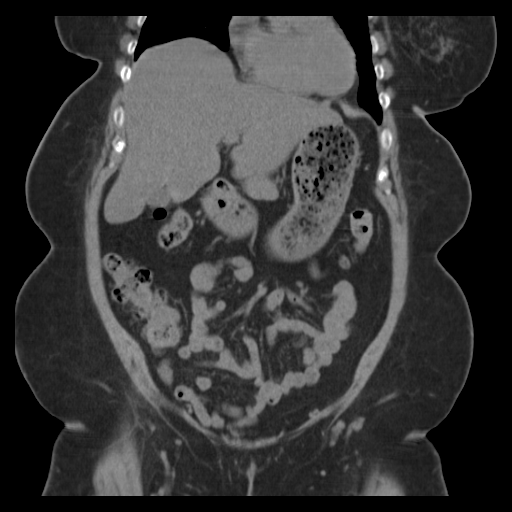
[im 37/83  soft-tissue]
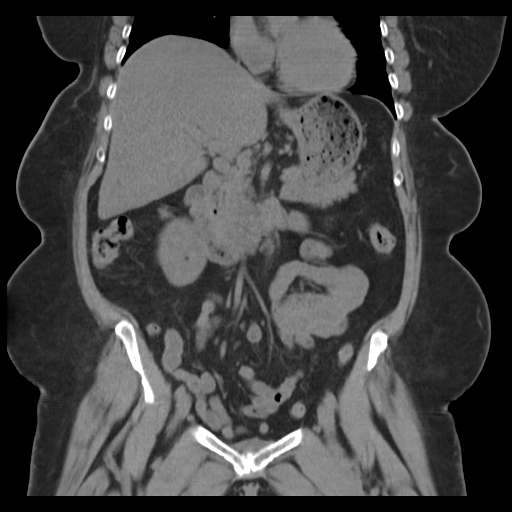
[im 46/83  soft-tissue]
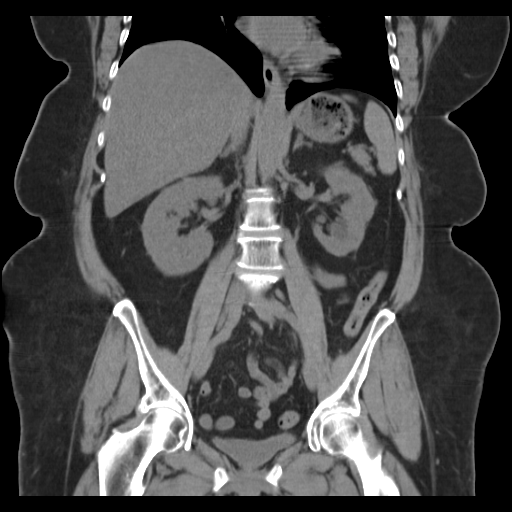

[17 of 46 positions shown; findings below may reference images not displayed]

FINDINGS: Please note that lack of oral and IV contrast limits this exam for
evaluation of entities other than urinary tract calculi.

Clear lung bases. right mastectomy. Normal heart size without pericardial or
pleural effusion.

Moderate fatty infiltration of the liver.

Normal spleen, stomach, pancreas, gallbladder, biliary tract, adrenal glands. 
Left renal calculi measuring 5 mm interpolar and 5 mm lower pole are. No
ureteric calculi. No right-sided urinary tract calculi.

No retroperitoneal or retrocrural adenopathy.

Normal appendix on image 73 and terminal ileum on images 65.

Normal abdominal small bowel loops without ascites.

IMPRESSION

1. No acute abdominal process. 
2. nonobstructive left renal calculi.
3. Moderate fatty infiltration of the liver.

PELVIS CT WITHOUT CONTRAST - URINARY STONE PROTOCOL
FINDINGS: No distal urinary tract calculi. Normal pelvic bowel loops. No pelvic
adenopathy or ascites. Hysterectomy. Normal urinary bladder. Bilateral inguinal
hernias containing fat.

IMPRESSION

1. Hysterectomy but no acute pelvic process.

## 2009-09-21 ENCOUNTER — Ambulatory Visit: Payer: Self-pay | Admitting: Family Medicine

## 2009-09-21 DIAGNOSIS — R109 Unspecified abdominal pain: Secondary | ICD-10-CM | POA: Insufficient documentation

## 2009-09-21 LAB — CONVERTED CEMR LAB: LDL Goal: 100 mg/dL

## 2009-09-21 LAB — HM DIABETES FOOT EXAM

## 2009-09-22 LAB — CONVERTED CEMR LAB
Albumin: 3.8 g/dL (ref 3.5–5.2)
BUN: 11 mg/dL (ref 6–23)
CO2: 25 meq/L (ref 19–32)
Creatinine, Ser: 0.5 mg/dL (ref 0.4–1.2)
GFR calc non Af Amer: 157.68 mL/min (ref >60)
HDL: 49.7 mg/dL (ref >39.00)
Hgb A1c MFr Bld: 11.8 % — ABNORMAL HIGH (ref 4.6–6.5)
LDL Cholesterol: 97 mg/dL (ref 0–99)
Total CHOL/HDL Ratio: 3
Triglycerides: 121 mg/dL (ref 0.0–149.0)

## 2009-09-24 ENCOUNTER — Encounter: Payer: Self-pay | Admitting: Family Medicine

## 2009-10-08 ENCOUNTER — Encounter: Payer: Self-pay | Admitting: Family Medicine

## 2009-11-25 ENCOUNTER — Encounter: Payer: Self-pay | Admitting: Family Medicine

## 2010-02-09 HISTORY — PX: OTHER SURGICAL HISTORY: SHX169

## 2010-02-14 IMAGING — CT CT PELVIS W/ CM
2 of 5 series · 16 of 46 positions shown, 18 images · IV contrast (agent unspecified)
Comparison: 04/18/2007

CT CHEST

CLINICAL DATA: Restaging breast cancer

CT CHEST, ABDOMEN AND PELVIS WITH CONTRAST
TECHNIQUE: Multidetector CT imaging of the chest, abdomen and
pelvis was performed following the standard protocol during bolus
administration of intravenous contrast.
Contrast: 125 ml omni 300

[Series 2: cap xxl 5.0 b40s · axial · 0.74mm/px · z∈[-628,-73]mm · 13 of 125 slices shown, 15 images]
[im 7/125  soft-tissue]
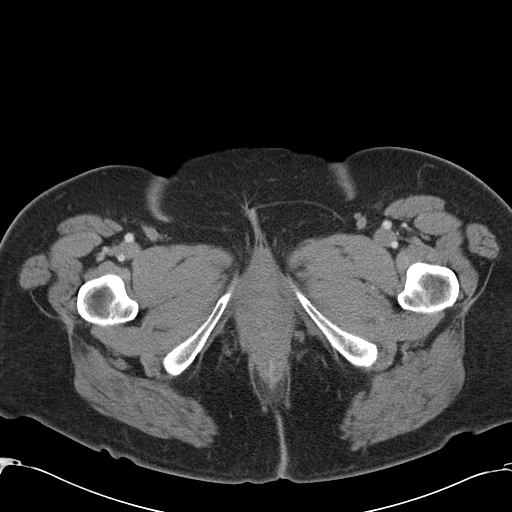
[im 7/125  bone]
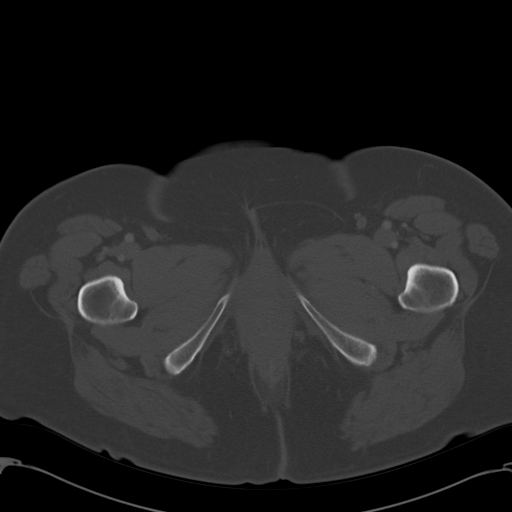
[im 14/125  soft-tissue]
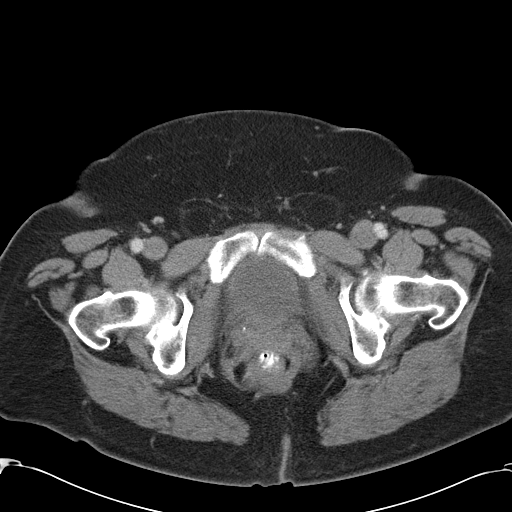
[im 28/125  soft-tissue]
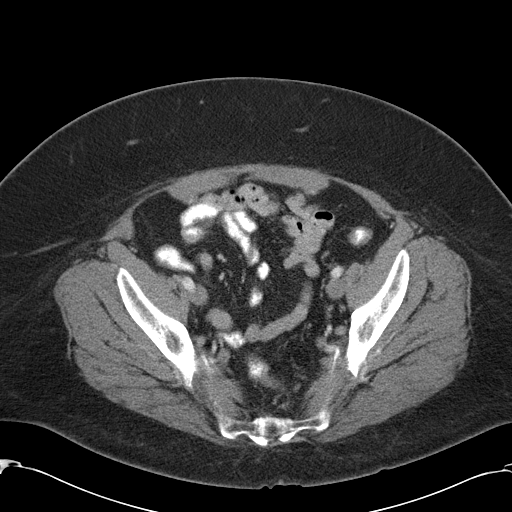
[im 35/125  soft-tissue]
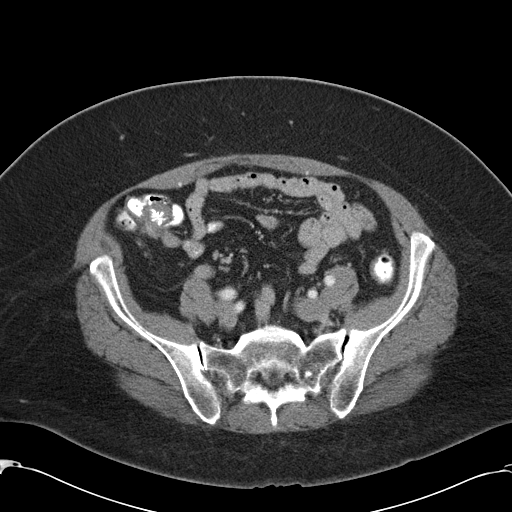
[im 42/125  soft-tissue]
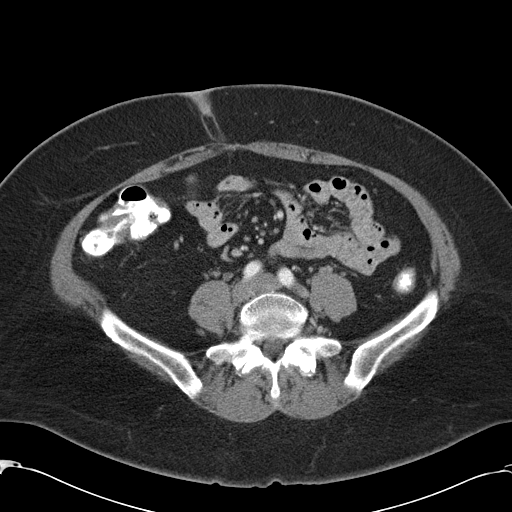
[im 56/125  soft-tissue]
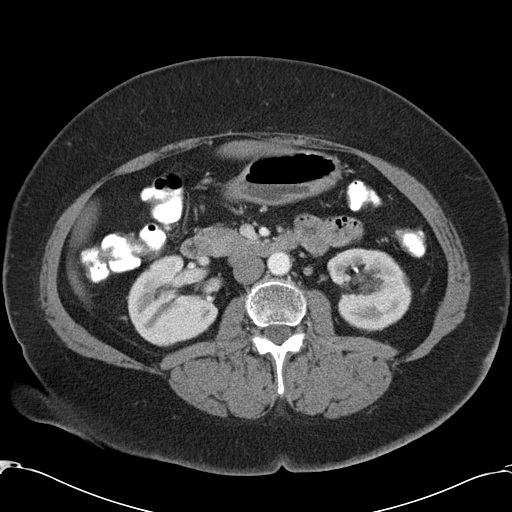
[im 63/125  soft-tissue]
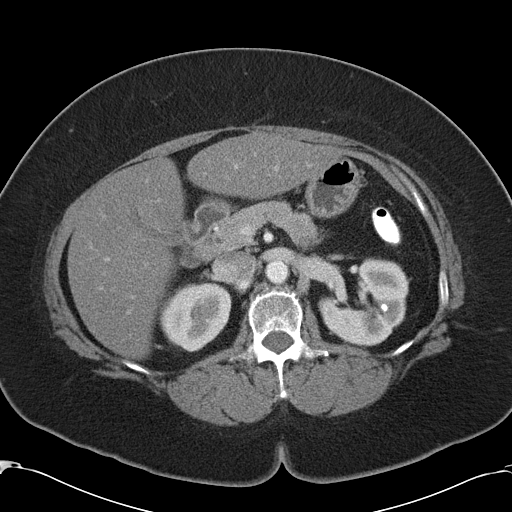
[im 69/125  soft-tissue]
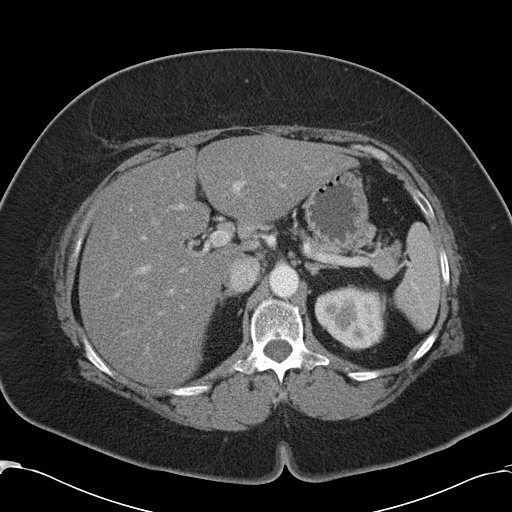
[im 83/125  soft-tissue]
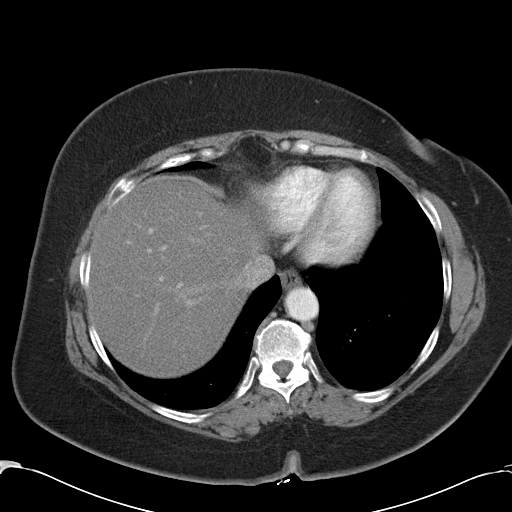
[im 83/125  bone]
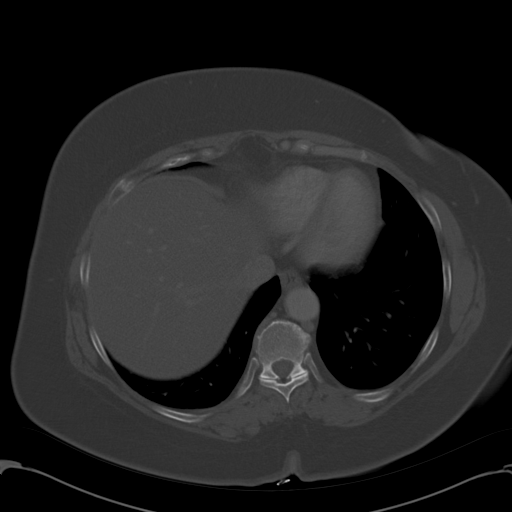
[im 90/125  soft-tissue]
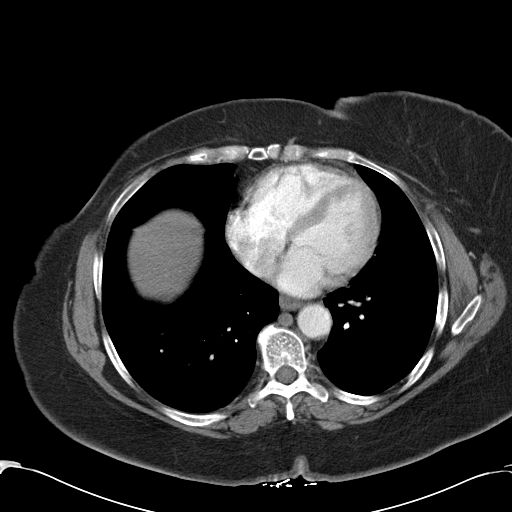
[im 97/125  soft-tissue]
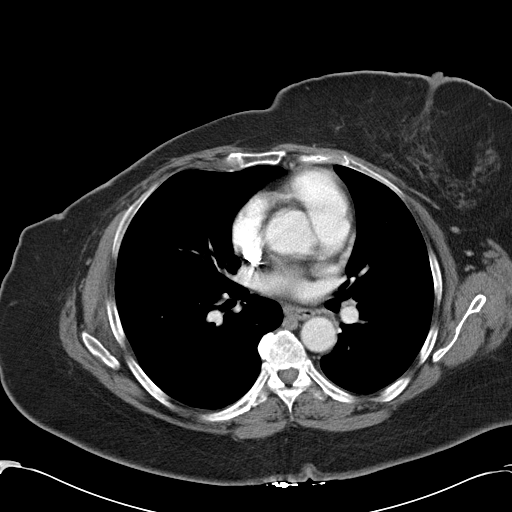
[im 111/125  soft-tissue]
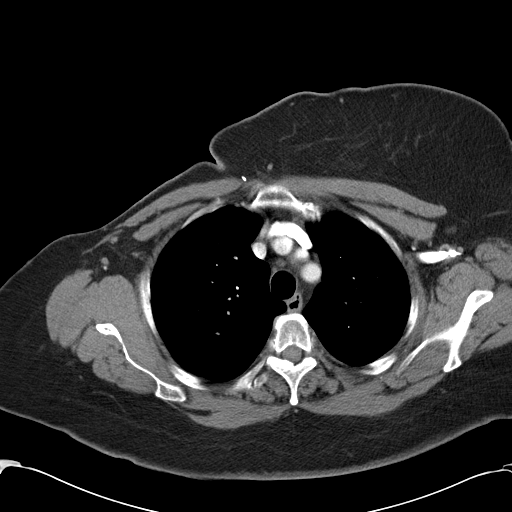
[im 118/125  soft-tissue]
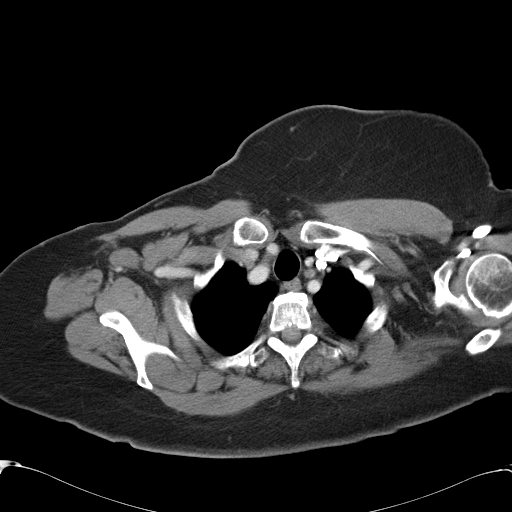

[Series 602: coronal images · coronal · 1.22mm/px · 3 of 100 slices shown]
[im 34/100  soft-tissue]
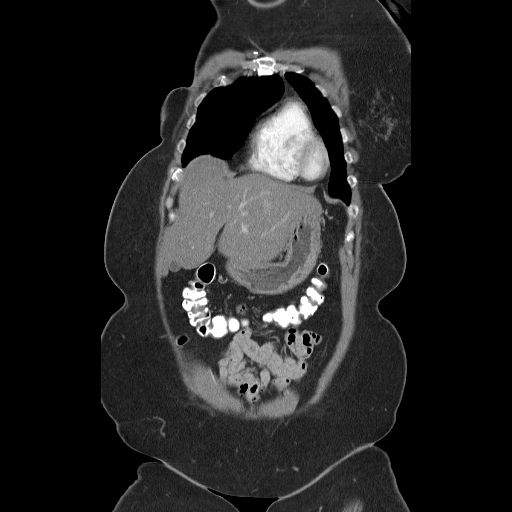
[im 45/100  soft-tissue]
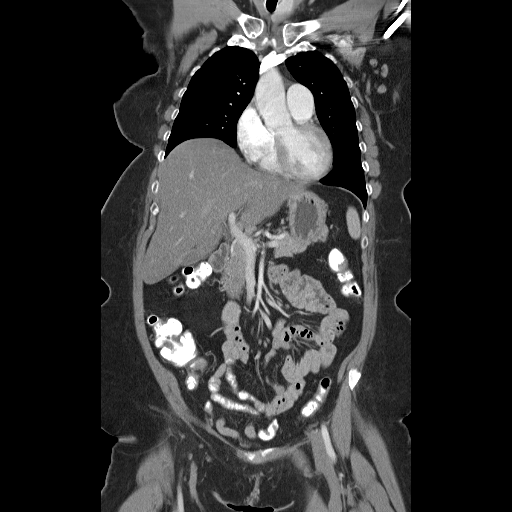
[im 56/100  soft-tissue]
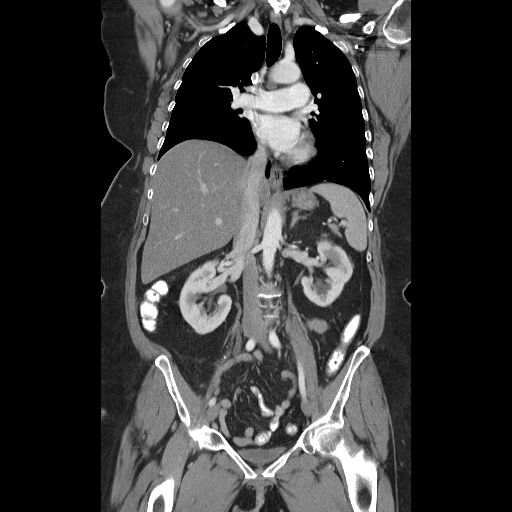

[16 of 46 positions shown; findings below may reference images not displayed]

FINDINGS: The patient is status post right mastectomy.

There is no enlarged axillary lymph nodes.

No enlarged mediastinal or hilar lymph nodes.

There is no pericardial or pleural fluid

The right lung is clear.

The left lung has a small nodule within the left upper lobe which
measures 3.3 mm, image number 19.  This is unchanged when compared
with the previous examination.

The remaining portions of the lung parenchyma are clear.

Review of the visualized osseous structures shows no specific
features to suggest bone metastases.  No lytic or sclerotic lesions
identified
IMPRESSION: 1.  Stable CT chest.  No specific features are identified to
suggest residual or recurrence of tumor

CT ABDOMEN
FINDINGS: Mild diffuse fatty infiltration of the liver.

No focal liver lesions are identified.

Both adrenal glands are normal.

The pancreas is normal.

Spleen is normal.

There is a nonobstructing stone within the lower pole collecting
system of the left kidney measuring 4.7 mm.  Smaller stone is
identified at the also within the lower pole of the left kidney.

There is no enlarged retroperitoneal or small bowel mesenteric
lymph nodes.

A small fat containing umbilical hernia is present.

There is no free fluid or abnormal fluid collections.

Review of the visualized osseous structures is unremarkable.
IMPRESSION: 1.  No evidence for mass or adenopathy.
2.  Fatty infiltration of the liver.
3.  Left renal calculi

CT PELVIS
FINDINGS: Appendix identified and normal.

Visualized colon and small bowel are unremarkable.

No free fluid or abnormal fluid collections.

No significant lymphadenopathy.

Urinary bladder is normal.
IMPRESSION: 1.  Negative for mass or adenopathy.

## 2010-02-22 ENCOUNTER — Telehealth: Payer: Self-pay | Admitting: Family Medicine

## 2010-02-25 ENCOUNTER — Ambulatory Visit: Payer: Self-pay | Admitting: Cardiovascular Disease

## 2010-02-25 ENCOUNTER — Ambulatory Visit: Payer: Self-pay | Admitting: Family Medicine

## 2010-02-25 LAB — CONVERTED CEMR LAB
ALT: 24 units/L (ref 0–35)
AST: 25 units/L (ref 0–37)
BUN: 14 mg/dL (ref 6–23)
Basophils Relative: 1.1 % (ref 0.0–3.0)
Bilirubin Urine: NEGATIVE
Blood in Urine, dipstick: NEGATIVE
Chloride: 101 meq/L (ref 96–112)
Eosinophils Relative: 3.2 % (ref 0.0–5.0)
GFR calc non Af Amer: 111.09 mL/min (ref >60)
HCT: 44.1 % (ref 36.0–46.0)
Hemoglobin: 15.1 g/dL — ABNORMAL HIGH (ref 12.0–15.0)
Ketones, urine, test strip: NEGATIVE
Lipase: 57 units/L (ref 11.0–59.0)
Lymphs Abs: 2.8 10*3/uL (ref 0.7–4.0)
MCV: 90.1 fL (ref 78.0–100.0)
Monocytes Absolute: 0.5 10*3/uL (ref 0.1–1.0)
Monocytes Relative: 7.4 % (ref 3.0–12.0)
Nitrite: NEGATIVE
Potassium: 4.2 meq/L (ref 3.5–5.1)
RBC: 4.89 M/uL (ref 3.87–5.11)
Sodium: 138 meq/L (ref 135–145)
Specific Gravity, Urine: 1.02
TSH: 1.88 microintl units/mL (ref 0.35–5.50)
Total Protein: 7.6 g/dL (ref 6.0–8.3)
WBC: 6.9 10*3/uL (ref 4.5–10.5)
pH: 5

## 2010-05-01 ENCOUNTER — Encounter: Payer: Self-pay | Admitting: Oncology

## 2010-05-02 ENCOUNTER — Encounter: Payer: Self-pay | Admitting: Emergency Medicine

## 2010-05-02 ENCOUNTER — Encounter: Payer: Self-pay | Admitting: Oncology

## 2010-05-11 NOTE — Letter (Signed)
Summary: [facility]  [facility]   Imported By: Bubba Hales 09/28/2009 12:16:04  _____________________________________________________________________  External Attachment:    Type:   Image     Comment:   External Document

## 2010-05-11 NOTE — Letter (Signed)
Summary: [facility]   Imported By: Edmonia James 12/01/2009 09:08:59  _____________________________________________________________________  External Attachment:    Type:   Image     Comment:   External Document

## 2010-05-11 NOTE — Letter (Signed)
Summary: [place]   Imported By: Edmonia James 08/31/2009 08:19:41  _____________________________________________________________________  External Attachment:    Type:   Image     Comment:   External Document  Appended Document: Ursina please let pt know I reviewed oncol note -- and she needs to f/u with me when able with sugar readings to review and also disc colon cancer screening   Appended Document: Goodnight Left message on voicemail  to return call.   Appended Document: [place] Patient Advised. Appointment scheduled

## 2010-05-11 NOTE — Letter (Signed)
Summary: [facility] By: Phillis Knack 05/01/2009 09:35:15  _____________________________________________________________________  External Attachment:    Type:   Image     Comment:   External Document

## 2010-05-11 NOTE — Progress Notes (Signed)
Summary: Rx Glucophage and Lantus  Phone Note Refill Request Call back at [phone] Message from:  [facility] on February 22, 2010 4:37 PM  Refills Requested: Medication #1:  LANTUS 100 UNIT/ML SOLN 70  units inject as directed at bedtime Patient is changing pharmacies. Received refill request form pharmacy for Glucophage 850mg , take one three times a day #270. (DOES NOT MATCH  MED SHEET). Request 2ml and 3 vials of the Lantus     Method Requested: Electronic Initial call taken by: Emelia Salisbury LPN,  November 14, 624THL 4:40 PM  Follow-up for Phone Call        please verify doses with pt due to mismatch and send back to me  also she is way overdue for f/u with me for DM  Follow-up by: Allena Earing MD,  February 22, 2010 4:52 PM  Additional Follow-up for Phone Call Additional follow up Details #1::        Left message for patient to call back. Ozzie Hoyle LPN  November 14, 624THL 5:13 PM   Pt said she is supposed to take the Metformin 850mg  two times a day but since she was seen in 09/2009 pt has only been taking one daily (due to cost) Pt does not ck her blood sugars at home. Pt has been taking Lantus insulin 70 units at bedtime. Pt is out of Metformin and low on insulin. Pt scheduled appt to see Dr Glori Bickers on 02/25/10 at 10am. As a side note pt said last night she had bright red bloody BM. This AM pt had BM with no blood in it. Pt to call back before appt if bleeding again.Ozzie Hoyle LPN  November 15, 624THL 2:34 PM     Additional Follow-up for Phone Call Additional follow up Details #2::    [facility] [address] per request of [facility]. Patient notified as instructed by telephone. Ozzie Hoyle LPN  November 15, 624THL 3:22 PM   New/Updated Medications: LANTUS 100 UNIT/ML SOLN (INSULIN GLARGINE) 70  units inject as directed at bedtime METFORMIN HCL 850 MG  TABS (METFORMIN HCL) one by mouth two  times a day Prescriptions: METFORMIN HCL 850 MG  TABS  (METFORMIN HCL) one by mouth two  times a day  #60 x 3   Entered by:   Ozzie Hoyle LPN   Authorized by:   Allena Earing MD   Signed by:   Ozzie Hoyle LPN on [id number]   Method used:   Faxed to ...       [facility] (retail)       [address]       [place], [place]  [address]       Ph: [phone]       Fax: [phone]   RxID:   [id number] LANTUS 100 UNIT/ML SOLN (INSULIN GLARGINE) 70  units inject as directed at bedtime  #3 months x 0   Entered by:   Ozzie Hoyle LPN   Authorized by:   Allena Earing MD   Signed by:   Ozzie Hoyle LPN on [id number]   Method used:   Faxed to ...       [facility] (retail)       [address]       [place], [place]  [address]       Ph: [phone]  Fax: [phone]   [id number]

## 2010-05-11 NOTE — Miscellaneous (Signed)
Summary: mammogram results   Clinical Lists Changes  Observations: Added new observation of MAMMO DUE: 05/2010 (05/08/2009 15:26) Added new observation of MAMMOGRAM: normal (05/08/2009 15:26)      Preventive Care Screening  Mammogram:    Date:  05/08/2009    Next Due:  05/2010    Results:  normal

## 2010-05-11 NOTE — Assessment & Plan Note (Signed)
Summary: Follow up blood sugars/Colon CA screening/lsr   Vital Signs:  Patient profile:   60 year old female Height:      65 inches Weight:      214.25 pounds BMI:     35.78 Temp:     98 degrees F oral Pulse rate:   68 / minute Pulse rhythm:   regular BP sitting:   124 / 70  (left arm) Cuff size:   large  Vitals Entered By: Ozzie Hoyle LPN (June 13, 624THL [id number] AM) CC: f/u blood sugar readings and discuss colon CA Screening, Lipid Management   History of Present Illness: here for /dm and to get colon ca screen scheduled   last AIC was 11.9 she is noncompliant and not coming for appts as scheduled  says sugars are all high- but forgot her sugar readings --all are over 200 , a few in 180s  there is issue of drinking coke  is still eating fried foods - they have been upsetting her stomach  has been in denial  finally is interested in getting under control   she does can and freeze her own fresh vegetables  decided to quit cokes  her husband sabotages her   going to the [employer] for [facility]ics -- and [facility]   does know what a DM diet is  does not want to go to classes   lots of trouble with her stomach -- lower abd   needs to disc colonosc and colon ca screen  had colonosc 05 Dr Oletta Lamas -- needs to discuss that -- was 5 year follow up   pains in neck and arm -- esp when lying down -- neck and shoulder  knows she has disc dz     Lipid Management History:      Positive NCEP/ATP III risk factors include female age 20 years old or older, diabetes, and hypertension.  Negative NCEP/ATP III risk factors include non-tobacco-user status.    Allergies: 1)  ! Ampicillin (Ampicillin) 2)  ! Latex Exam Gloves (Disposable Gloves)  Review of Systems General:  Complains of fatigue; denies chills, loss of appetite, and malaise. Eyes:  Denies blurring and eye irritation. CV:  Denies chest pain or discomfort, palpitations, shortness of breath with exertion, and  swelling of feet. Resp:  Denies cough, pleuritic, shortness of breath, and wheezing. GI:  Complains of abdominal pain and gas; denies bloody stools, change in bowel habits, indigestion, nausea, and vomiting. GU:  Denies dysuria and urinary frequency. MS:  Complains of joint pain and stiffness; denies muscle aches and cramps. Derm:  Denies itching, lesion(s), poor wound healing, and rash. Neuro:  Complains of numbness and tingling; denies weakness. Psych:  Denies panic attacks, sense of great danger, and suicidal thoughts/plans; mood is more depressed . Endo:  Denies cold intolerance, excessive thirst, excessive urination, and heat intolerance. Heme:  Denies abnormal bruising and bleeding.  Physical Exam  General:  overweight but generally well appearing  Head:  normocephalic, atraumatic, and no abnormalities observed.   Eyes:  vision grossly intact, pupils equal, pupils round, and pupils reactive to light.  no conjunctival pallor, injection or icterus  Ears:  R ear normal and L ear normal.   Mouth:  pharynx pink and moist.   Neck:  supple with full rom and no masses or thyromegally, no JVD or carotid bruit  Chest Wall:  No deformities, masses, or tenderness noted. Lungs:  Normal respiratory effort, chest expands symmetrically. Lungs are  clear to auscultation, no crackles or wheezes. Heart:  Normal rate and regular rhythm. S1 and S2 normal without gallop, murmur, click, rub or other extra sounds. Abdomen:  Bowel sounds positive,abdomen soft and non-tender without masses, organomegaly or hernias noted. no renal bruits  Msk:  No deformity or scoliosis noted of thoracic or lumbar spine.  no acute joint changes  Pulses:  R and L carotid,radial,femoral,dorsalis pedis and posterior tibial pulses are full and equal bilaterally Extremities:  No clubbing, cyanosis, edema, or deformity noted with normal full range of motion of all joints.   Neurologic:  gait normal and DTRs symmetrical and normal.   baseline neuropathy in feet  Skin:  Intact without suspicious lesions or rashes Cervical Nodes:  No lymphadenopathy noted Inguinal Nodes:  No significant adenopathy Psych:  seems down good eye contact and comm skills  Diabetes Management Exam:    Foot Exam (with socks and/or shoes not present):       Sensory-Pinprick/Light touch:          Left medial foot (L-4): normal          Left dorsal foot (L-5): normal          Left lateral foot (S-1): normal          Right medial foot (L-4): normal          Right dorsal foot (L-5): normal          Right lateral foot (S-1): normal       Sensory-Monofilament:          Left foot: diminished          Right foot: diminished       Inspection:          Left foot: normal          Right foot: normal       Nails:          Left foot: normal          Right foot: normal   Impression & Recommendations:  Problem # 1:  ABDOMINAL PAIN, LOWER (ICD-789.09) Assessment New with intermittent diarrhea likely DM gastroparesis  needs colonosc 5 y ref to GI Her updated medication list for this problem includes:    Acetaminophen 500 Mg Caps (Acetaminophen) .[name] Kitchen... 2 tabs hs as needed pain    Motrin Ib 200 Mg Tabs (Ibuprofen) ..... Otc as directed.    Aleve 220 Mg Tabs (Naproxen sodium) ..... Otc as directed.  Orders: Gastroenterology Referral (GI) Prescription Created Electronically [phone])  Problem # 2:  HYPERTENSION (ICD-401.9) Assessment: Unchanged  good control- no changes in medicine labs today Her updated medication list for this problem includes:    Hyzaar 50-12.5 Mg Tabs (Losartan potassium-hctz) .[name] Kitchen... 1 by mouth once daily in am  Orders: Venipuncture [id number]) TLB-Lipid Panel (80061-LIPID) TLB-Renal Function Panel (80069-RENAL) TLB-TSH (Thyroid Stimulating Hormone) (84443-TSH) TLB-ALT (SGPT) (84460-ALT) TLB-AST (SGOT) (84450-SGOT) TLB-A1C / Hgb A1C (Glycohemoglobin) (83036-A1C) Prescription Created Electronically [phone])  BP today:  124/70 Prior BP: 124/74 (07/09/2009)  Labs Reviewed: K+: 3.8 (03/18/2009) Creat: : 0.7 (03/18/2009)   Chol: 183 (03/18/2009)   HDL: 45.70 (03/18/2009)   LDL: 102 (03/18/2009)   TG: 177.0 (03/18/2009)  Problem # 3:  HYPOTHYROIDISM (ICD-244.9) Assessment: Unchanged  check tsh- pt is fatigued and unmotivated  Her updated medication list for this problem includes:    Levothroid 100 Mcg Tabs (Levothyroxine sodium) .[name] Kitchen... 1 by mouth once daily  Orders: Venipuncture [id number]) TLB-Lipid Panel (80061-LIPID) TLB-Renal Function Panel (80069-RENAL) TLB-TSH (  Thyroid Stimulating Hormone) (84443-TSH) TLB-ALT (SGPT) (84460-ALT) TLB-AST (SGOT) (84450-SGOT) TLB-A1C / Hgb A1C (Glycohemoglobin) (83036-A1C) Prescription Created Electronically [phone])  Labs Reviewed: TSH: 0.85 (03/18/2009)    HgBA1c: 11.9 (03/18/2009) Chol: 183 (03/18/2009)   HDL: 45.70 (03/18/2009)   LDL: 102 (03/18/2009)   TG: 177.0 (03/18/2009)  Problem # 4:  DIABETES MELLITUS, TYPE II (ICD-250.00) Assessment: Deteriorated  out of control and pt in denial- disc this in length today she states she is ready to start paying attn and tx the disease is aware this means a big lifestyle change  lab today inc lantus to 70 units  update 2 wk with sugar record two times a day ) emph imp of DM diet and exercise Her updated medication list for this problem includes:    Lantus 100 Unit/ml Soln (Insulin glargine) .[name] KitchenMarland KitchenMarland KitchenMarland Kitchen 70  units inject as directed at bedtime    Metformin Hcl 850 Mg Tabs (Metformin hcl) ..... One by mouth two  times a day    Hyzaar 50-12.5 Mg Tabs (Losartan potassium-hctz) .[name] Kitchen... 1 by mouth once daily in am  Orders: Venipuncture [id number]) TLB-Lipid Panel (80061-LIPID) TLB-Renal Function Panel (80069-RENAL) TLB-TSH (Thyroid Stimulating Hormone) (84443-TSH) TLB-ALT (SGPT) (84460-ALT) TLB-AST (SGOT) (84450-SGOT) TLB-A1C / Hgb A1C (Glycohemoglobin) (83036-A1C) Prescription Created Electronically [phone])  Labs  Reviewed: Creat: 0.7 (03/18/2009)     Last Eye Exam: normal (07/11/2007) Reviewed HgBA1c results: 11.9 (03/18/2009)  12.4 (06/30/2008)  Complete Medication List: 1)  Lantus 100 Unit/ml Soln (Insulin glargine) .... 70  units inject as directed at bedtime 2)  Levothroid 100 Mcg Tabs (Levothyroxine sodium) .[name] Kitchen.. 1 by mouth once daily 3)  Metformin Hcl 850 Mg Tabs (Metformin hcl) .... One by mouth two  times a day 4)  Zantac 75 75 Mg Tabs (Ranitidine hcl) .... One by mouth daily as needed 5)  Nitroquick 0.4 Mg Subl (Nitroglycerin) .... Take sl as directed as needed 6)  One Touch Test Strips  .... To check glucose two times a day and as needed for out of control dm2 250.0 7)  Acetaminophen 500 Mg Caps (Acetaminophen) .... 2 tabs hs as needed pain 8)  Hyzaar 50-12.5 Mg Tabs (Losartan potassium-hctz) .[name] Kitchen.. 1 by mouth once daily in am 9)  Motrin Ib 200 Mg Tabs (Ibuprofen) .... Otc as directed. 10)  Flonase 50 Mcg/act Susp (Fluticasone propionate) .... 2 sprays in each nostril once daily as needed 11)  Aleve 220 Mg Tabs (Naproxen sodium) .... Otc as directed. 12)  Needles For Lantus Insulin  .... To inject once daily as needed  Lipid Assessment/Plan:      Based on NCEP/ATP III, the patient's risk factor category is "history of diabetes".  The patient's lipid goals are as follows: Total cholesterol goal is 200; LDL cholesterol goal is 100; HDL cholesterol goal is 40; Triglyceride goal is 150.     Patient Instructions: 1)  quit cokes today 2)  quit fried foods 3)  follow diabetic diet  4)  exercise 5 days per week -- good job with this so far  5)  increase your lantus to 70 unts once daily  6)  send me some glucose records in 2 weeks -- and check sugar two times a day  7)  I will advise you further then  8)  lab today 9)  we will do GI consult at check out for abdominal pain and to discuss colon cancer screen  10)  follow up with your orthopedic doctor about neck/ shoulder and arm problem 11)   follow  up with me in 3 months please  Prescriptions: NEEDLES FOR LANTUS INSULIN to inject once daily as needed  #3 months x 3   Entered and Authorized by:   Allena Earing MD   Signed by:   Allena Earing MD on 09/21/2009   Method used:   Print then Give to Patient   RxID:   [id number] HYZAAR 50-12.5 MG TABS (LOSARTAN POTASSIUM-HCTZ) 1 by mouth once daily in am  #90 x 3   Entered and Authorized by:   Allena Earing MD   Signed by:   Allena Earing MD on 09/21/2009   Method used:   Electronically to        [facility] [phone]* (retail)       [place], [place]  [address]       Ph: [phone]       Fax: [phone]   RxID:   [id number] LANTUS 100 UNIT/ML SOLN (INSULIN GLARGINE) 70  units inject as directed at bedtime  #3 month x 3   Entered and Authorized by:   Allena Earing MD   Signed by:   Allena Earing MD on 09/21/2009   Method used:   Electronically to        [facility] [phone]* (retail)       [place],   [address]       Ph: [phone]       Fax: [phone]   RxID:   [id number]   Current Allergies (reviewed today): ! AMPICILLIN (AMPICILLIN) ! LATEX EXAM GLOVES (DISPOSABLE GLOVES)

## 2010-05-11 NOTE — Procedures (Signed)
Summary: Colonoscopy by Dr.James Edwards,[facility]  Colonoscopy by Dr.James Edwards,[facility]   Imported By: Virgia Land 10/21/2009 16:37:05  _____________________________________________________________________  External Attachment:    Type:   Image     Comment:   External Document  Appended Document: Colonoscopy by Dr.James Edwards,[facility]     Clinical Lists Changes

## 2010-05-11 NOTE — Assessment & Plan Note (Signed)
Summary: HEAD ACHE FOR A WEEK / LFW   Vital Signs:  Patient profile:   60 year old female Height:      65 inches Weight:      215 pounds BMI:     35.91 Temp:     98.2 degrees F oral Pulse rate:   60 / minute Pulse rhythm:   regular BP sitting:   124 / 74  (left arm) Cuff size:   large  Vitals Entered By: Ozzie Hoyle LPN (March 31, 624THL [id number] AM) CC: Headache for 2 weeks, and cannot hear out of rt ear   History of Present Illness: symptoms of headache for 2 weeks  thinks it is sinus infx congestion and sneezing a lot  cannot hear out of R ear -- it is full feeling and congested  face is full and swollen feeling -  neck and shoulders are sore  no vision changes   cannot blow anything out of her nose -- but is congested  took tylenol and motrin - no help  tried flexeril - did not help   has hx of headaches migraine- has not had one in a while ? what she used to take   no fever little dry cough   sugars have been up and down  not compliant with checks or f/u lately last AIc high- pt failed to f/u for this with her log no motivation lately   Allergies: 1)  ! Ampicillin (Ampicillin) 2)  ! Latex Exam Gloves (Disposable Gloves)  Past History:  Past Medical History: Last updated: 08/05/2008 Breast cancer, hx of Diabetes mellitus, type II GERD Headache Hypothyroidism Osteoarthritis Peripheral neuropathy HTN  cadiomyopathy (from prev chemotx) with EF 40%  cardiol- Dr Johnsie Cancel  Past Surgical History: Last updated: 08/23/2006 Hysterectomy- 1993 Total knee replacement, right- 2000 Mastectomy- 12/2003 Thyroidectomy, right- 1977 Neck Surgery- 2001 Left shoulder surgery- 2003 Hospitalized with CP, dystolic dysfunction- 0000000 Lymph node removal, squamous cell cancer- 2003 Cardiac cath- ? 2005 and in 05/ 2007 Adenosin cardiolyte ( + ) wall motion abnormality- 07/2005 EGD, GERD- 2005 Colonoscopy- 06/2003 Ultrasound abdomen- fatty liver / kidney stones-  07/2005  Family History: Last updated: 09/16/2008 Family History Diabetes 1st degree relative Family History Hypertension Father living- DM and HTN Mother living- HTN 5 Siblings alive and well:  4 brothers- 2 have HTN and 1 has DM, the other okay. 1 sister who has HTN  Social History: Last updated: 09/16/2008 Retired- since 2003 Married- 1 daughter who is alive and well. Never Smoked Alcohol Use - no  Risk Factors: Smoking Status: never (08/23/2006)  Review of Systems General:  Complains of fatigue and malaise; denies chills and fever. Eyes:  Complains of eye irritation; denies blurring and discharge. ENT:  Complains of earache, hoarseness, nasal congestion, postnasal drainage, and sinus pressure; denies sore throat. CV:  Denies chest pain or discomfort and palpitations. Resp:  Complains of cough; denies pleuritic, shortness of breath, sputum productive, and wheezing. GI:  Denies nausea and vomiting. Derm:  Denies itching, lesion(s), poor wound healing, and rash. Neuro:  Complains of headaches; denies numbness and tingling. Psych:  Complains of depression; denies sense of great danger and suicidal thoughts/plans. Endo:  Denies cold intolerance, excessive thirst, excessive urination, and heat intolerance. Heme:  Denies abnormal bruising and bleeding.  Physical Exam  General:  fatigued/ overwt app Head:  normocephalic, atraumatic, and no abnormalities observed.  tender R max and eth sinuses no temple tenderness no rash or skin change  Eyes:  vision  grossly intact, pupils equal, pupils round, and pupils reactive to light.  no nystagmus no conjunctival pallor, injection or icterus  Ears:  TMs are dull bilat , some cerumen Nose:  nares congested  Mouth:  pharynx pink and moist, no erythema, and no exudates.   Neck:  supple with full rom and no masses or thyromegally, no JVD or carotid bruit  Chest Wall:  No deformities, masses, or tenderness noted. Lungs:  Normal  respiratory effort, chest expands symmetrically. Lungs are clear to auscultation, no crackles or wheezes. Heart:  Normal rate and regular rhythm. S1 and S2 normal without gallop, murmur, click, rub or other extra sounds. Extremities:  No clubbing, cyanosis, edema, or deformity noted with normal full range of motion of all joints.   Skin:  Intact without suspicious lesions or rashes Cervical Nodes:  No lymphadenopathy noted Psych:  seems down and fatigued today   Impression & Recommendations:  Problem # 1:  SINUSITIS - ACUTE-NOS (ICD-461.9) Assessment New  with R sided facial pain / headache/ congestion and ETD pt is amp all- ? of pcn will tx with levaquin and update  also flonase for 2 mo for all congestion update if not imp (pt also has hx of migraine) or if rash or vision change  recommend sympt care- see pt instructions  -- water and nasal saline Her updated medication list for this problem includes:    Levaquin 500 Mg Tabs (Levofloxacin) .[name] Kitchen... 1 by mouth once daily for 10 days for sinus infection    Flonase 50 Mcg/act Susp (Fluticasone propionate) .[name] Kitchen... 2 sprays in each nostril once daily  Orders: Prescription Created Electronically [phone])  Problem # 2:  DIABETES MELLITUS, TYPE II (ICD-250.00) Assessment: Deteriorated  pt has been in denial of DM lately with poor diet and sugary drink consumptio and not following up  had long disc about this- is willing to make some changes last AIC over 11 f/u in 2-4 week with two times a day sugar log and may need to adj med  Her updated medication list for this problem includes:    Lantus 100 Unit/ml Soln (Insulin glargine) .[name] KitchenMarland KitchenMarland KitchenMarland Kitchen 60  units inject as directed at bedtime    Metformin Hcl 850 Mg Tabs (Metformin hcl) ..... One by mouth three times a day    Hyzaar 50-12.5 Mg Tabs (Losartan potassium-hctz) .[name] Kitchen... 1 by mouth once daily in am  Labs Reviewed: Creat: 0.7 (03/18/2009)     Last Eye Exam: normal (07/11/2007) Reviewed HgBA1c  results: 11.9 (03/18/2009)  12.4 (06/30/2008)  Complete Medication List: 1)  Lantus 100 Unit/ml Soln (Insulin glargine) .... 60  units inject as directed at bedtime 2)  Levothroid 100 Mcg Tabs (Levothyroxine sodium) .[name] Kitchen.. 1 by mouth once daily 3)  Metformin Hcl 850 Mg Tabs (Metformin hcl) .... One by mouth three times a day 4)  Zantac 75 75 Mg Tabs (Ranitidine hcl) .... One by mouth daily as needed 5)  Nitroquick 0.4 Mg Subl (Nitroglycerin) .... Take sl as directed as needed 6)  One Touch Test Strips  .... To check glucose two times a day and as needed for out of control dm2 250.0 7)  Acetaminophen 500 Mg Caps (Acetaminophen) .... 2 tabs hs as needed pain 8)  Hyzaar 50-12.5 Mg Tabs (Losartan potassium-hctz) .[name] Kitchen.. 1 by mouth once daily in am 9)  Motrin Ib 200 Mg Tabs (Ibuprofen) .... Otc as directed. 10)  Levaquin 500 Mg Tabs (Levofloxacin) .[name] Kitchen.. 1 by mouth once daily for 10 days for sinus infection  11)  Flonase 50 Mcg/act Susp (Fluticasone propionate) .... 2 sprays in each nostril once daily  Patient Instructions: 1)  drink lots of fluids 2)  use warm compress on face  3)  take levaquin for sinus infection and use flonase for congestion (for at least 2 months through allergy season) 4)  if not improved in 10 days let me know  5)  check sugar am fasting and 2 hours after an afternoon or evening meal and keep a log -- follow up in 1 month to discuss a plan for diabetes  6)  try to follow a diabetic diet  7)  stop the sodas , and juice -- drink water and diet drinks  Prescriptions: FLONASE 50 MCG/ACT SUSP (FLUTICASONE PROPIONATE) 2 sprays in each nostril once daily  #1 mdi x 11   Entered and Authorized by:   Allena Earing MD   Signed by:   Allena Earing MD on 07/09/2009   Method used:   Electronically to        [facility] [phone]* (retail)       [place], [place]  [address]       Ph: [phone]       Fax: [phone]   RxID:   [id number] LEVAQUIN 500  MG TABS (LEVOFLOXACIN) 1 by mouth once daily for 10 days for sinus infection  #10 x 0   Entered and Authorized by:   Allena Earing MD   Signed by:   Allena Earing MD on 07/09/2009   Method used:   Electronically to        [facility] [phone]* (retail)       [place], [place]  [address]       Ph: [phone]       Fax: [phone]   RxID:   [id number]   Current Allergies (reviewed today): ! AMPICILLIN (AMPICILLIN) ! LATEX EXAM GLOVES (DISPOSABLE GLOVES)

## 2010-05-11 NOTE — Assessment & Plan Note (Signed)
Summary: SIDE PAIN,STOMACH/CLE   Vital Signs:  Patient profile:   60 year old female Weight:      207.75 pounds Temp:     98.2 degrees F oral Pulse rate:   72 / minute Pulse rhythm:   regular BP sitting:   126 / 72  (left arm) Cuff size:   large  Vitals Entered By: Maudie Mercury Dance CMA Deborra Medina) (February 25, 2010 10:49 AM)  CC: Side and RLQ pain   History of Present Illness: CC: side and R abd pain  4d h/o abd pain.  R side of abdomen, unable to characterize pain.  Pain worse at night.  During day dull achey.  Monday morning at 5am, woke up from stomach pain, got up and had BM with bright red blood.  Since Monday no more blood but pain remaining.  Heating pad eases pain at night.  Taking tylenol, not helping.  Took pain pills for back, didn't help.  + nausea.  + 4lb weight loss since sunday according to patient.    no fever/chill.  appetite ok.  No vomiting.  Normal stools.  + diarrhea 5-6x/day, loose stools.  No change in color, no melena.   This has happened before, usually comes for 1-2 days then goes away.  Happened  ~2006, admitted into hospital then, weren't unable to find cause of this.  Onc Dr. Humphrey Rolls, squamous cell CA unknown primary.  h/o colonoscopy this year, normal.  given pills to put under tongue for stomach, not helping.  h/o kidney stones L side.  Last saw Dr Oletta Lamas   Current Medications (verified): 1)  Lantus 100 Unit/ml Soln (Insulin Glargine) .... 70  Units Inject As Directed At Bedtime 2)  Levothroid 100 Mcg  Tabs (Levothyroxine Sodium) .[name] Kitchen.. 1 By Mouth Once Daily 3)  Metformin Hcl 850 Mg  Tabs (Metformin Hcl) .... One By Mouth Two  Times A Day 4)  Zantac 75 75 Mg  Tabs (Ranitidine Hcl) .... One By Mouth Daily As Needed 5)  Nitroquick 0.4 Mg  Subl (Nitroglycerin) .... Take Sl As Directed As Needed 6)  One Touch Test Strips .... To Check Glucose Two Times A Day and As Needed For Out of Control Dm2 250.0 7)  Acetaminophen 500 Mg  Caps (Acetaminophen) .... 2 Tabs Hs As  Needed Pain 8)  Hyzaar 50-12.5 Mg Tabs (Losartan Potassium-Hctz) .[name] Kitchen.. 1 By Mouth Once Daily in Am 9)  Motrin Ib 200 Mg Tabs (Ibuprofen) .... Otc As Directed. 10)  Flonase 50 Mcg/act Susp (Fluticasone Propionate) .... 2 Sprays in Each Nostril Once Daily As Needed 11)  Aleve 220 Mg Tabs (Naproxen Sodium) .... Otc As Directed. 12)  Needles For Lantus Insulin .... To Inject Once Daily As Needed  Allergies: 1)  ! Ampicillin (Ampicillin) 2)  ! Latex Exam Gloves (Disposable Gloves)  Past History:  Past Medical History: Last updated: 08/05/2008 Breast cancer, hx of Diabetes mellitus, type II GERD Headache Hypothyroidism Osteoarthritis Peripheral neuropathy HTN  cadiomyopathy (from prev chemotx) with EF 40%  cardiol- Dr Johnsie Cancel  Past Surgical History: Last updated: 08/23/2006 Hysterectomy- 1993 Total knee replacement, right- 2000 Mastectomy- 12/2003 Thyroidectomy, right- 1977 Neck Surgery- 2001 Left shoulder surgery- 2003 Hospitalized with CP, dystolic dysfunction- 0000000 Lymph node removal, squamous cell cancer- 2003 Cardiac cath- ? 2005 and in 05/ 2007 Adenosin cardiolyte ( + ) wall motion abnormality- 07/2005 EGD, GERD- 2005 Colonoscopy- 06/2003 Ultrasound abdomen- fatty liver / kidney stones- 07/2005  Social History: Last updated: 09/16/2008 Retired- since 2003 Married- 1 daughter  who is alive and well. Never Smoked Alcohol Use - no  Review of Systems       per HPI  Physical Exam  General:  overweight, initially NAD, then when gets on exam table evident discomfort continuing after exam Mouth:  pharynx pink and moist.   Lungs:  Normal respiratory effort, chest expands symmetrically. Lungs are clear to auscultation, no crackles or wheezes. Heart:  Normal rate and regular rhythm. S1 and S2 normal without gallop, murmur, click, rub or other extra sounds. Abdomen:  NABS, soft, nondistended, tender to palpation diffusily, concentrated BLQ.  mild rebound, no guarding.   No HSM, no murphy sign.  Neg psoas test. Rectal:  + ext hemorrhoids, not inflammed.  No bleeding.  No stool in vault Normal sphincter tone. No rectal masses or tenderness.  guaiac neg. Pulses:  2+ rad pulses Extremities:  no edema Skin:  Intact without suspicious lesions or rashes   Impression & Recommendations:  Problem # 1:  ABDOMINAL PAIN, LOWER (ICD-789.09) with hematochezia 4 d ago, now resovled.  unclear etiology.  check blood work (CBC, abd studies), obtain CT scan given age and h/o CA to eval for colitis, appendicitis, other cause.  guaiac neg.  h/o hospitalization with ? gastritis/hiatal hernia 2006.  don't think acute appy 2/2 duration of illness. ? colitis but no fever.  If all normal, would ask to see GI sooner.  UA ++ glu, o/w negative.  Her updated medication list for this problem includes:    Acetaminophen 500 Mg Caps (Acetaminophen) .[name] Kitchen... 2 tabs hs as needed pain    Motrin Ib 200 Mg Tabs (Ibuprofen) ..... Otc as directed.    Aleve 220 Mg Tabs (Naproxen sodium) ..... Otc as directed.  Orders: Radiology Referral (Radiology) TLB-BMP (Basic Metabolic Panel-BMET) (99991111) TLB-CBC Platelet - w/Differential (85025-CBCD) TLB-Hepatic/Liver Function Pnl (80076-HEPATIC) TLB-TSH (Thyroid Stimulating Hormone) (84443-TSH) TLB-Lipase (83690-LIPASE) UA Dipstick w/o Micro (manual) (81002) Hemoccult Guaiac-1 spec.(in office) (82270)  Complete Medication List: 1)  Lantus 100 Unit/ml Soln (Insulin glargine) .... 70  units inject as directed at bedtime 2)  Levothroid 100 Mcg Tabs (Levothyroxine sodium) .[name] Kitchen.. 1 by mouth once daily 3)  Metformin Hcl 850 Mg Tabs (Metformin hcl) .... One by mouth two  times a day 4)  Zantac 75 75 Mg Tabs (Ranitidine hcl) .... One by mouth daily as needed 5)  Nitroquick 0.4 Mg Subl (Nitroglycerin) .... Take sl as directed as needed 6)  One Touch Test Strips  .... To check glucose two times a day and as needed for out of control dm2 250.0 7)   Acetaminophen 500 Mg Caps (Acetaminophen) .... 2 tabs hs as needed pain 8)  Hyzaar 50-12.5 Mg Tabs (Losartan potassium-hctz) .[name] Kitchen.. 1 by mouth once daily in am 9)  Motrin Ib 200 Mg Tabs (Ibuprofen) .... Otc as directed. 10)  Flonase 50 Mcg/act Susp (Fluticasone propionate) .... 2 sprays in each nostril once daily as needed 11)  Aleve 220 Mg Tabs (Naproxen sodium) .... Otc as directed. 12)  Needles For Lantus Insulin  .... To inject once daily as needed  Patient Instructions: 1)  I'm not sure what is causing your abdominal pain.  Could be inflammation/infection of the colon. 2)  we will set you up with a CT scan, hopefully today. 3)  blood work today as well. 4)  I would like you to call Dr. Oletta Lamas office to set up appointment to see him about this as well.   Orders Added: 1)  Radiology Referral [Radiology] 2)  TLB-BMP (Basic Metabolic Panel-BMET) 123456 3)  TLB-CBC Platelet - w/Differential [85025-CBCD] 4)  TLB-Hepatic/Liver Function Pnl [80076-HEPATIC] 5)  TLB-TSH (Thyroid Stimulating Hormone) [84443-TSH] 6)  TLB-Lipase [83690-LIPASE] 7)  Est. Patient Level IV [id number] 8)  UA Dipstick w/o Micro (manual) [81002] 9)  Hemoccult Guaiac-1 spec.(in office) [82270]    Current Allergies (reviewed today): ! AMPICILLIN (AMPICILLIN) ! LATEX EXAM GLOVES (DISPOSABLE GLOVES)  Laboratory Results   Urine Tests  Date/Time Received: February 25, 2010 10:57 AM  Date/Time Reported: February 25, 2010 10:57 AM   Routine Urinalysis   Color: yellow Appearance: Clear Glucose: 2000 +   (Normal Range: Negative) Bilirubin: negative   (Normal Range: Negative) Ketone: negative   (Normal Range: Negative) Spec. Gravity: 1.020   (Normal Range: 1.003-1.035) Blood: negative   (Normal Range: Negative) pH: 5.0   (Normal Range: 5.0-8.0) Protein: negative   (Normal Range: Negative) Urobilinogen: 0.2   (Normal Range: 0-1) Nitrite: negative   (Normal Range: Negative) Leukocyte Esterace: negative    (Normal Range: Negative)

## 2010-05-20 ENCOUNTER — Encounter: Payer: Self-pay | Admitting: Family Medicine

## 2010-05-20 ENCOUNTER — Ambulatory Visit (INDEPENDENT_AMBULATORY_CARE_PROVIDER_SITE_OTHER): Payer: Medicare HMO | Admitting: Family Medicine

## 2010-05-20 DIAGNOSIS — L089 Local infection of the skin and subcutaneous tissue, unspecified: Secondary | ICD-10-CM

## 2010-05-20 DIAGNOSIS — Z136 Encounter for screening for cardiovascular disorders: Secondary | ICD-10-CM

## 2010-05-20 DIAGNOSIS — E039 Hypothyroidism, unspecified: Secondary | ICD-10-CM

## 2010-05-20 DIAGNOSIS — R079 Chest pain, unspecified: Secondary | ICD-10-CM | POA: Insufficient documentation

## 2010-05-20 DIAGNOSIS — H6120 Impacted cerumen, unspecified ear: Secondary | ICD-10-CM | POA: Insufficient documentation

## 2010-05-27 NOTE — Assessment & Plan Note (Signed)
Summary: sinus infection, chest feels tight/alc   Vital Signs:  Patient profile:   60 year old female Weight:      209.50 pounds Temp:     98.5 degrees F oral Pulse rate:   74 / minute Pulse rhythm:   regular BP sitting:   122 / 78  (left arm) Cuff size:   large  Vitals Entered By: Maudie Mercury Dance CMA (AAMA) (May 20, 2010 3:21 PM) CC: ? Sinus infection/chest tightness/check spot on abd.    History of Present Illness: CC: chest pain  59yo with h/o DM, GERD, HTN, CM from chemo for breast cancer, last taken 2006.  1d h/o chest pain characterized as heaviness on left side, traveled to back.  movement worsened pain.  Hurt all evening.  This morning felbetter but stil had some pain in middle of chest.  At onset cleaning cabinets.  Not relieved by rest.  Also noticed sharp pain when pushing wood in stove with poker.  Pain improved today.  No nausea/vomiting, diaphoresis, SOB.  was also some lightheaded.  maalox didn't help.  reproducible with palpation.  previously seen for this in ER and given muscle relaxant (cyclobenzaprine) which helped.  No family hx CAD at early age.  no smoking.  ALSO ran out of thyroid medicine a few weeks back.  requests refill. ALSO dry nose for last few weeks, bleeding for a few days.  stops quickly.  no fevers/chills, coughing, no congestion, no HA.   ALSO ear wax impaction bilaterally.  tried debrox then mirene neither have helped.   ALSO wants boil on abd checked - self-drained last week, still tender.  Current Medications (verified): 1)  Lantus 100 Unit/ml Soln (Insulin Glargine) .... 70  Units Inject As Directed At Bedtime 2)  Levothroid 100 Mcg  Tabs (Levothyroxine Sodium) .[name] Kitchen.. 1 By Mouth Once Daily 3)  Metformin Hcl 850 Mg  Tabs (Metformin Hcl) .... One By Mouth Two  Times A Day 4)  Zantac 75 75 Mg  Tabs (Ranitidine Hcl) .... One By Mouth Daily As Needed 5)  Nitroquick 0.4 Mg  Subl (Nitroglycerin) .... Take Sl As Directed As Needed 6)  One Touch  Test Strips .... To Check Glucose Two Times A Day and As Needed For Out of Control Dm2 250.0 7)  Acetaminophen 500 Mg  Caps (Acetaminophen) .... 2 Tabs Hs As Needed Pain 8)  Hyzaar 50-12.5 Mg Tabs (Losartan Potassium-Hctz) .[name] Kitchen.. 1 By Mouth Once Daily in Am 9)  Motrin Ib 200 Mg Tabs (Ibuprofen) .... Otc As Directed. 10)  Flonase 50 Mcg/act Susp (Fluticasone Propionate) .... 2 Sprays in Each Nostril Once Daily As Needed 11)  Needles For Lantus Insulin .... To Inject Once Daily As Needed  Allergies: 1)  ! Ampicillin (Ampicillin) 2)  ! Latex Exam Gloves (Disposable Gloves)  Past History:  Past Medical History: Last updated: 08/05/2008 Breast cancer, hx of Diabetes mellitus, type II GERD Headache Hypothyroidism Osteoarthritis Peripheral neuropathy HTN  cadiomyopathy (from prev chemotx) with EF 40%  cardiol- Dr Johnsie Cancel  Family History: Last updated: 09/16/2008 Family History Diabetes 1st degree relative Family History Hypertension Father living- DM and HTN Mother living- HTN 5 Siblings alive and well:  4 brothers- 2 have HTN and 1 has DM, the other okay. 1 sister who has HTN  Social History: Last updated: 09/16/2008 Retired- since 2003 Married- 1 daughter who is alive and well. Never Smoked Alcohol Use - no  Review of Systems       per HPI  Physical Exam  General:  Well-developed,well-nourished,in no acute distress; alert,appropriate and cooperative throughout examination Head:  normocephalic, atraumatic, and no abnormalities observed.   Eyes:  vision grossly intact, pupils equal, pupils round, and pupils reactive to light.  no conjunctival pallor, injection or icterus  Ears:  TMs occluded by cerumen bilaterally Nose:  nares congested, some irritation of turbinates, no evident bleeding Mouth:  pharynx pink and moist.   Neck:  supple with full rom and no masses or thyromegally, no JVD or carotid bruit  Chest Wall:  + tender to palpation bilateral upper costochondral  joints Lungs:  Normal respiratory effort, chest expands symmetrically. Lungs are clear to auscultation, no crackles or wheezes. Heart:  Normal rate and regular rhythm. S1 and S2 normal without gallop, murmur, click, rub or other extra sounds. Abdomen:  Bowel sounds positive,abdomen soft and non-tender without masses, organomegaly or hernias noted. Pulses:  2+ rad pulses Extremities:  no edema Skin:  Intact without suspicious lesions or rashes  left abdomen with residual boil, s/p draining, mild erythema and induration, no fluctuance.    Impression & Recommendations:  Problem # 1:  CHEST PAIN UNSPECIFIED (ICD-786.50) sounds more musculoskeletal in nature, treat accordingly with cyclobenzaprine and NSAIDs.  update if not improving.  however does have risk factors in form of weight, DM.  has h/o CM from chemo with EF 40%.  Refilled SL nitro.  advised to treat as MSK, if not improving to let us know.  if CP returning or worsening to seek urgent medical care.  EKG - NSR 66, LAD, LVH based on avL, no old EKG to compare.  no ST/T changes  Orders: EKG w/ Interpretation (93000)  Problem # 2:  UNSPEC LOCAL INFECTION SKIN&SUBCUTANEOUS TISSUE (ICD-686.9) treat with warm compress, seems to be resolving, advised if coming to head or enlarging to return for further eval, consider course abx.  Problem # 3:  HYPOTHYROIDISM (ICD-244.9) last check 02/2010 wnl on med.  off for 3 wks.  restart, 1/2 pill for 2 wks then back to previous dose where was well controlled.  Her updated medication list for this problem includes:    Levothroid 100 Mcg Tabs (Levothyroxine sodium) .[name] Kitchen... 1 by mouth once daily  Labs Reviewed: TSH: 1.88 (02/25/2010)    HgBA1c: 11.8 (09/21/2009) Chol: 171 (09/21/2009)   HDL: 49.70 (09/21/2009)   LDL: 97 (09/21/2009)   TG: 121.0 (09/21/2009)  Problem # 4:  CERUMEN IMPACTION, BILATERAL (ICD-380.4)  disimpaction performed today.  if not effective, would likely refer to ENT for  vacuum disimpaction  Orders: Cerumen Impaction Removal [id number])  Complete Medication List: 1)  Lantus 100 Unit/ml Soln (Insulin glargine) .... 70  units inject as directed at bedtime 2)  Levothroid 100 Mcg Tabs (Levothyroxine sodium) .[name] Kitchen.. 1 by mouth once daily 3)  Metformin Hcl 850 Mg Tabs (Metformin hcl) .... One by mouth two  times a day 4)  Zantac 75 75 Mg Tabs (Ranitidine hcl) .... One by mouth daily as needed 5)  Nitroquick 0.4 Mg Subl (Nitroglycerin) .... Take sl as directed as needed 6)  One Touch Test Strips  .... To check glucose two times a day and as needed for out of control dm2 250.0 7)  Acetaminophen 500 Mg Caps (Acetaminophen) .... 2 tabs hs as needed pain 8)  Hyzaar 50-12.5 Mg Tabs (Losartan potassium-hctz) .[name] Kitchen.. 1 by mouth once daily in am 9)  Motrin Ib 200 Mg Tabs (Ibuprofen) .... Otc as directed. 10)  Flonase 50 Mcg/act Susp (Fluticasone propionate) .... 2  sprays in each nostril once daily as needed 11)  Needles For Lantus Insulin  .... To inject once daily as needed 12)  Cyclobenzaprine Hcl 10 Mg Tabs (Cyclobenzaprine hcl) .... Take one by mouth three times a day as needed muscle spasm  Patient Instructions: 1)  Refilled thyroid medicine - take 1/2 pill for 1 week while body gets used to medicine again then full dose 118mcg daily 2)  Refilled sublingual nitro. 3)  Start cyclobenzaprine and some advil for chest soreness - sounds like muscle strain versus chostocondritis.  Not sounding cardiac, however, if happens again, try nitro and if used call us.  if worsening, go to ER. 4)  For boil - use warm compresses three times a day.  if redness or swelling worsening, let us know.  if coming to a head, return. 5)  For nosebleed - try vaseline, humidifier, continue nasal saline, hold on nasal steroid. 6)  For ear wax - tried to disimpact today.  if you'd like referral to ENT let us know. Prescriptions: CYCLOBENZAPRINE HCL 10 MG TABS (CYCLOBENZAPRINE HCL) take one by mouth three  times a day as needed muscle spasm  #30 x 0   Entered and Authorized by:   Ria Bush  MD   Signed by:   Ria Bush  MD on 05/20/2010   Method used:   Electronically to        [facility]       [address]       [place], [place]  [address]       Ph: [phone]       Fax: [phone]   [id number] [place] 0.4 MG  SUBL (NITROGLYCERIN) take SL as directed as needed  #1 x 3   Entered and Authorized by:   Ria Bush  MD   Signed by:   Ria Bush  MD on 05/20/2010   Method used:   Electronically to        [facility]       [address]       [place], [place]  [address]       Ph: [phone]       Fax: [phone]   RxID:   [id number] LEVOTHROID 100 MCG  TABS (LEVOTHYROXINE SODIUM) 1 by mouth once daily  #30 x 3   Entered and Authorized by:   Ria Bush  MD   Signed by:   Ria Bush  MD on 05/20/2010   Method used:   Electronically to        [facility]       [address]       [place], [place]  [address]       Ph: [phone]       Fax: [phone]   RxID:   [id number]    Orders Added: 1)  Est. Patient Level IV [id number] 2)  EKG w/ Interpretation [93000] 3)  Cerumen Impaction Removal [id number]    Current Allergies (reviewed today): ! AMPICILLIN (AMPICILLIN) ! LATEX EXAM GLOVES (DISPOSABLE GLOVES)

## 2010-06-11 ENCOUNTER — Other Ambulatory Visit: Payer: Self-pay | Admitting: Oncology

## 2010-06-11 ENCOUNTER — Encounter (HOSPITAL_BASED_OUTPATIENT_CLINIC_OR_DEPARTMENT_OTHER): Payer: Medicare HMO | Admitting: Oncology

## 2010-06-11 DIAGNOSIS — C50419 Malignant neoplasm of upper-outer quadrant of unspecified female breast: Secondary | ICD-10-CM

## 2010-06-11 LAB — CBC WITH DIFFERENTIAL/PLATELET
Basophils Absolute: 0.1 10*3/uL (ref 0.0–0.1)
EOS%: 3.2 % (ref 0.0–7.0)
Eosinophils Absolute: 0.2 10*3/uL (ref 0.0–0.5)
HCT: 41.9 % (ref 34.8–46.6)
HGB: 13.8 g/dL (ref 11.6–15.9)
LYMPH%: 40.7 % (ref 14.0–49.7)
MCH: 29.2 pg (ref 25.1–34.0)
MCV: 88.6 fL (ref 79.5–101.0)
MONO%: 8.5 % (ref 0.0–14.0)
NEUT#: 2.6 10*3/uL (ref 1.5–6.5)
NEUT%: 45.7 % (ref 38.4–76.8)
Platelets: 234 10*3/uL (ref 145–400)

## 2010-06-11 LAB — COMPREHENSIVE METABOLIC PANEL
CO2: 25 mEq/L (ref 19–32)
Calcium: 8.9 mg/dL (ref 8.4–10.5)
Chloride: 102 mEq/L (ref 96–112)
Glucose, Bld: 339 mg/dL — ABNORMAL HIGH (ref 70–99)
Sodium: 139 mEq/L (ref 135–145)
Total Bilirubin: 0.5 mg/dL (ref 0.3–1.2)
Total Protein: 7.5 g/dL (ref 6.0–8.3)

## 2010-06-15 ENCOUNTER — Encounter: Payer: Self-pay | Admitting: Family Medicine

## 2010-06-15 ENCOUNTER — Encounter (HOSPITAL_BASED_OUTPATIENT_CLINIC_OR_DEPARTMENT_OTHER): Payer: Medicare HMO | Admitting: Oncology

## 2010-06-15 DIAGNOSIS — C50419 Malignant neoplasm of upper-outer quadrant of unspecified female breast: Secondary | ICD-10-CM

## 2010-06-29 NOTE — Letter (Signed)
Summary: [facility]   Imported By: Laural Benes 06/25/2010 13:15:23  _____________________________________________________________________  External Attachment:    Type:   Image     Comment:   External Document

## 2010-07-07 LAB — HM MAMMOGRAPHY: HM Mammogram: NORMAL

## 2010-07-22 LAB — POCT CARDIAC MARKERS
CKMB, poc: <1 ng/mL — ABNORMAL LOW (ref 1.0–8.0)
Myoglobin, poc: 60.6 ng/mL (ref 12–200)

## 2010-07-22 LAB — POCT I-STAT, CHEM 8
Creatinine, Ser: 0.6 mg/dL (ref 0.4–1.2)
HCT: 46 % (ref 36.0–46.0)
Hemoglobin: 15.6 g/dL — ABNORMAL HIGH (ref 12.0–15.0)
Potassium: 3.7 mEq/L (ref 3.5–5.1)
Sodium: 137 mEq/L (ref 135–145)

## 2010-07-23 ENCOUNTER — Telehealth: Payer: Self-pay

## 2010-07-23 NOTE — Telephone Encounter (Signed)
Patient notified as instructed by telephone normal mammogram and repeat in 1 yr.. Entered in health maintenance and sent for scanning.

## 2010-07-26 ENCOUNTER — Telehealth: Payer: Self-pay | Admitting: *Deleted

## 2010-07-26 NOTE — Telephone Encounter (Signed)
Patient dropped off form for [facility] to be filled out. Form is on your shelf.

## 2010-07-26 NOTE — Telephone Encounter (Signed)
I need to see her for this- is a disability form Will give you back the form to hold until her appt-thanks

## 2010-07-28 NOTE — Telephone Encounter (Signed)
Patient notified as instructed by telephone. Pt scheduled 30 min appt 08/28/10 at 2:00pm.

## 2010-08-02 ENCOUNTER — Encounter: Payer: Self-pay | Admitting: Family Medicine

## 2010-08-03 ENCOUNTER — Other Ambulatory Visit: Payer: Self-pay | Admitting: *Deleted

## 2010-08-03 ENCOUNTER — Encounter: Payer: Self-pay | Admitting: Family Medicine

## 2010-08-03 ENCOUNTER — Ambulatory Visit (INDEPENDENT_AMBULATORY_CARE_PROVIDER_SITE_OTHER): Payer: Medicare HMO | Admitting: Family Medicine

## 2010-08-03 VITALS — BP 122/64 | HR 76 | Temp 98.2°F | Wt 213.0 lb

## 2010-08-03 DIAGNOSIS — R51 Headache: Secondary | ICD-10-CM

## 2010-08-03 MED ORDER — KETOROLAC TROMETHAMINE 60 MG/2ML IM SOLN
30.0000 mg | Freq: Once | INTRAMUSCULAR | Status: AC
  Administered 2010-08-03: 30 mg via INTRAMUSCULAR

## 2010-08-03 MED ORDER — INSULIN GLARGINE 100 UNIT/ML ~~LOC~~ SOLN: 70.0000 [IU] | Freq: Every day | SUBCUTANEOUS | Status: DC

## 2010-08-03 MED ORDER — CYCLOBENZAPRINE HCL 10 MG PO TABS: 10.0000 mg | ORAL_TABLET | Freq: Two times a day (BID) | ORAL | Status: DC | PRN

## 2010-08-03 NOTE — Progress Notes (Signed)
  Subjective:    Patient ID: [name], female    DOB: Jul 09, 1950, 60 y.o.   MRN: [id number]  HPI CC: HA  3 wk h/o HA.  Initially attributed to allergies.  Pain especially in back of neck, using heating pad.  Rubbing icy hot on neck as well.  Seems to start in temples then travels through head to back of neck.  Pain described as constant ache.  Wakes up in am with pain.  Taking tylenol then ibuprofen then aspirin.  Thinks ibuprofen may be upsetting stomach.  Taking about 8-10 total analgesic pills/day.  Tried cyclobenzaprine as well.  At worse gets 10/10, currently 8/10.  No vision changes, no photo/phonophobia, no v.  + nausea with this along with diarrhea.  Does tend to get slightly lightheaded with HA.  No fevers/chills.  Sunday especially bad, in bed all day.  Has been having headaches since 1967.  These three weeks feels worse because it stays constant (previously has had for 1 wk but then resolved).  Drove self today.  Does have h/o cervical neck surgery in past, told had another disk that needed surgery but then would have stiff neck so declined surgery.    Drinks mostly water. Caffeine - 1 coke/day, chocolate - not too much, fast foods/processed foods - not too much.  Medications and allergies reviewed and updated as above. pmhx reviewed for relevance  Review of Systems Per HPI    Objective:   Physical Exam  Vitals reviewed. Constitutional: She is oriented to person, place, and time. She appears well-developed and well-nourished. No distress.  HENT:  Head: Normocephalic and atraumatic.  Right Ear: External ear normal.  Left Ear: External ear normal.  Nose: Nose normal.  Mouth/Throat: Oropharynx is clear and moist. No oropharyngeal exudate.  Eyes: Conjunctivae and EOM are normal. Pupils are equal, round, and reactive to light. No scleral icterus.  Neck: Normal range of motion. Neck supple.  Cardiovascular: Normal rate, regular rhythm, normal heart sounds and intact  distal pulses.   No murmur heard. Pulmonary/Chest: Effort normal and breath sounds normal. No respiratory distress. She has no wheezes. She has no rales.  Musculoskeletal:       limited neck ROM 2/2 pain Neg spurling bilaterally No midline cervical spine tenderness. + tenderness to bilateral palpation occipital base of skull  Lymphadenopathy:    She has no cervical adenopathy.  Neurological: She is alert and oriented to person, place, and time. No cranial nerve deficit. She exhibits normal muscle tone. Coordination normal.  Skin: Skin is warm and dry. No rash noted.  Psychiatric: She has a normal mood and affect. Her behavior is normal. Judgment and thought content normal.          Assessment & Plan:

## 2010-08-03 NOTE — Patient Instructions (Addendum)
Shot of toradol today to see if we can help pain. Try to keep log of headaches and if any specific thing worsening them. Try to back off pain medicines as overuse can cause headaches as well (no tylenol, ibuprofen, aspirin). Try flexeril 10mg  when you have headache, then try to sleep it off (sent in to pharmacy, sedation precautions). F/u with Dr. Glori Bickers.  Sooner if any worsening pain, fevers.  Alternatively this could be coming from neck. If not improving, may consider daily headache prevention medicine. Nasal saline irrigation to help with nosebleeds and sinus congestion. Good to see you today, call us with quesitons.

## 2010-08-03 NOTE — Telephone Encounter (Signed)
Rx refilled.

## 2010-08-03 NOTE — Assessment & Plan Note (Addendum)
Not necessarily consistent with migraines.  ? TTH vs allergies/sinus (start nasal saline irrigation) vs coming from neck as per pt h/o bulging disk. Discussed MOH, rec back down from analgesics.  Use flexeril as PRN med.  May need daily ppx med if not improving. Reviewed caffeine use, minimal, advised keep headache diary and bring in to next appt with PCP. Neuro nonfocal today. Toradol 30mg  IM given today (pt drove self here).

## 2010-08-06 ENCOUNTER — Encounter: Payer: Self-pay | Admitting: Family Medicine

## 2010-08-18 ENCOUNTER — Encounter: Payer: Self-pay | Admitting: Family Medicine

## 2010-08-18 ENCOUNTER — Ambulatory Visit (INDEPENDENT_AMBULATORY_CARE_PROVIDER_SITE_OTHER): Payer: Medicare HMO | Admitting: Family Medicine

## 2010-08-18 DIAGNOSIS — M5030 Other cervical disc degeneration, unspecified cervical region: Secondary | ICD-10-CM

## 2010-08-18 DIAGNOSIS — I10 Essential (primary) hypertension: Secondary | ICD-10-CM

## 2010-08-18 DIAGNOSIS — E119 Type 2 diabetes mellitus without complications: Secondary | ICD-10-CM

## 2010-08-18 DIAGNOSIS — G609 Hereditary and idiopathic neuropathy, unspecified: Secondary | ICD-10-CM

## 2010-08-18 DIAGNOSIS — E039 Hypothyroidism, unspecified: Secondary | ICD-10-CM

## 2010-08-18 NOTE — Patient Instructions (Addendum)
Please make copy of disability form and give back to patient  Schedule f/u in 2 months with labs prior  Call Dr Windy Carina office about your neck and let us know if you need a referral  Try your best to follow diabetic diet

## 2010-08-18 NOTE — Assessment & Plan Note (Signed)
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

## 2010-08-18 NOTE — Assessment & Plan Note (Signed)
Due to denial and ref to change lifestyle pt has declined f/u for this Managed to convince her to get lab and f/u 2 mo  Disc diet and lifestyle change

## 2010-08-18 NOTE — Progress Notes (Signed)
Subjective:    Patient ID: [name], female    DOB: 1950-10-23, 60 y.o.   MRN: [id number]  HPI Is here to fill out disability paperwork   Is overdue to f/u for her DM and other chronic problems  Still in denial about her DM and eats fried chicken and cokes - has no control  Is agreeable to follow up in the summer   Neuropathy markedly limits her Cannot wear closed toe shoes at all  Cannot stand or walk for more than a few minutes due to pain  Cannot keyboard due to numb and painful fingers  Has tried neurontin- suicidal thoughts and not candidate for lyrica  Is constantly in pain and miserable   This is primarily from chemo Also diabetes  Really hurts to heck sugar in fingers  Tried freestyle- could not get blood out   Is having more neck problems Surgery in the past -- a fusion -- screws in it -- Dr Saintclair Halsted Now having a lot more popping in neck - on and off and more pain  Got a toradol shot from Dr Lynnae Sandhoff  ? Needs to f/u with Dr Saintclair Halsted  Has constant headache from it now   Past Medical History  Diagnosis Date  . GERD (gastroesophageal reflux disease)   . Hypertension   . Breast cancer   . Diabetes mellitus     Type II  . Headache   . Hypothyroidism   . Osteoarthritis   . Peripheral neuropathy   . Cardiomyopathy   . Chest pain 06/2005    Hospitalized, dystolic dysfunction    Past Surgical History  Procedure Date  . Abdominal hysterectomy 1993  . Total knee arthroplasty 2000    Right  . Mastectomy 12/2003  . Thyroidectomy 1977    Right  . Neck surgery 2001  . Shoulder surgery 2003    Left  . Lymph node removal 2003    Squamous cell CA  . Cardiac catheterization ? 2005 / 2007  . Adenosin cardiolite 07/2005    (+) wall motion abnormality  . Esophagogastroduodenoscopy 2005    GERD  . [place] of abdomen 07/2005    Fatty liver / kidney stones  . Ct abdomen and pelvis 02/2010    Same    Family History  Problem Relation Age of Onset  . Hypertension  Mother   . Diabetes Father   . Hypertension Father   . Hypertension Sister   . Diabetes Other     Family Hx. of Diabetes  . Hypertension Other     Family History of HTN  . Hypertension Brother   . Hypertension Brother   . Diabetes Brother     Allergies  Allergen Reactions  . Ampicillin     REACTION: Rash and whelps on her hands when she took it in the hospital  . Latex     REACTION: Patient had a reaction to tape after surgery and they told her she was allergic to Latex    History   Social History  . Marital Status: Married    Spouse Name: N/A    Number of Children: 1  . Years of Education: N/A   Occupational History  . Retired since 2003    Social History Main Topics  . Smoking status: Never Smoker   . Smokeless tobacco: Not on file  . Alcohol Use: No  . Drug Use: No  . Sexually Active: Not on file   Other Topics Concern  . Not on  file   Social History Narrative  . No narrative on file        Review of Systems Review of Systems  Constitutional: Negative for fever, appetite chang and unexpected weight change. pos for fatigue  Eyes: Negative for pain and visual disturbance.  Respiratory: Negative for cough and shortness of breath.   Cardiovascular: Negative.   Gastrointestinal: Negative for nausea, diarrhea and constipation.  Genitourinary: Negative for urgency and dysuria/ pos for frequency Skin: Negative for pallor.  Neurological: Negative for weakness, light-headedness, pos for numbness and ha/ neck pain Hematological: Negative for adenopathy. Does not bruise/bleed easily.  Psychiatric/Behavioral: Negative for dysphoric mood. The patient is not nervous/anxious.          Objective:   Physical Exam  Constitutional: She appears well-developed and well-nourished.       overwt and well appearing   HENT:  Head: Normocephalic and atraumatic.  Eyes: Conjunctivae and EOM are normal. Pupils are equal, round, and reactive to light.  Neck: Neck supple. No  JVD present. Carotid bruit is not present. No thyromegaly present.       Limited rom neck with some lower bony tenderness  Cardiovascular: Normal rate, regular rhythm and normal heart sounds.  Exam reveals no gallop.   Pulmonary/Chest: Effort normal and breath sounds normal. No respiratory distress. She has no wheezes.  Abdominal: Soft. Bowel sounds are normal. She exhibits no distension and no mass. There is no tenderness.  Musculoskeletal: Normal range of motion. She exhibits no edema and no tenderness.  Lymphadenopathy:    She has no cervical adenopathy.  Neurological: She is alert. She has normal strength. A sensory deficit is present. No cranial nerve deficit. Coordination normal. She displays Babinski's sign on the left side.       Decreased monofilament sensation in feet  Hypersensitivity in fingers to temp and sharp  Skin: Skin is warm and dry. No rash noted. No erythema. No pallor.  Psychiatric: She has a normal mood and affect.          Assessment & Plan:

## 2010-08-18 NOTE — Assessment & Plan Note (Signed)
This is more severe with frequent headaches and worsening neck pain  Pt will check into f/u Dr Saintclair Halsted who did he last surgery

## 2010-08-18 NOTE — Assessment & Plan Note (Addendum)
Lab and f/u 2 mo

## 2010-08-27 NOTE — Discharge Summary (Signed)
[name], [name]             ACCOUNT NO.:  [id number]   MEDICAL RECORD NO.:  [id number]          PATIENT TYPE:  INP   LOCATION:  [id number]                         FACILITY:  [facility]   PHYSICIAN:  Gwendolyn Grant, M.D. LHCDATE OF BIRTH:  1950-11-22   DATE OF ADMISSION:  07/03/2005  DATE OF DISCHARGE:  07/05/2005                                 DISCHARGE SUMMARY   ADDENDUM   The patient underwent a 2D echocardiogram which shows a normal left  ventricular ejection fraction of 55% to 60% but did note a grade 2 diastolic  dysfunction.  This diastolic dysfunction may be contributing to the  patient's shortness of breath.  Consider outpatient cardiology referral.  Will defer to the patient's primary care [name].      Melissa S. Inda Castle, NP      Gwendolyn Grant, M.D. [facility]  Electronically Signed    MSO/MEDQ  D:  07/05/2005  T:  07/06/2005  Job:  [id number]   cc:   Marne A. Tower, M.D. [facility]  [address] [place]  [place] [address]

## 2010-08-27 NOTE — Assessment & Plan Note (Signed)
[name] OFFICE NOTE   [name], [name]                    MRN:          [id number]  DATE:05/17/2006                            DOB:          1950/06/04    [name] returns today for follow up.  She is a 60 year old diabetic with  hypertension.  She has had constant atypical chest pain.  She has been  cathed in 2002 and it was normal.  She was cathed at the end of 2007 and  had no coronary disease.  She has had a cardiomyopathy with an EF as low  as 40% in the past; however, it was normal at 55% by her last echo at  the end of 2007.   I did talk to her a bit about some of her medications.  I told her that  from a cardiac perspective, given her history of volume overload, I  would like to get her off Actos.  She will talk to Dr. Dorothea Pratt about this.  She was also noncompliant and stopped her Hyzaar for a while.  Apparently, she had increased coughing, and I do not know if this  represented an elevated LVEDP or some other problem.  She is back on her  Hyzaar.  I explained to her the long-term benefit of this in terms of  her blood pressure, cardiomyopathy and protein-sparing effect with her  diabetes.   REVIEW OF SYSTEMS:  Otherwise remarkable for no PND.  She does get  occasional swelling.  I told her that I would give her a prescription  for Lasix to take in addition to her Hyzaar in case she would be volume  overloaded.  She will try to increase her exercise activity.  She has  not been particularly strict with her diet or salt intake.   MEDICATIONS:  1. Glipizide 10 b.i.d.  2. Lantus as directed.  3. Actos 45 a day, which will hopefully be changed and possibly      substituted with Biada or some other oral agent.  4. Hyzaar 50/12.5.  Samples were given today.  5. Synthroid 100 mcg daily.  6. Zantac.   She has a history of breast cancer with previous right mastectomy.  She  had a CT scan done in  January which was excellent.  Previous ground  glass appearance in the lungs was not commented on, and she had no  metastatic disease.   PHYSICAL EXAMINATION:  GENERAL:  She is overweight.  She is status post  right mastectomy.  VITAL SIGNS:  Blood pressure is 130/70, pulse is 70 and regular.  HEENT:  Otherwise normal.  There is no lymphadenopathy.  There are no  carotid bruits.  LUNGS:  Clear.  HEART:  There is an S1 and an S2 with a soft murmur.  ABDOMEN:  Benign.  LOWER EXTREMITIES:  Intact pulses.  No edema.   IMPRESSION:  Atypical chest pain.  Normal cath recently with probable  costochondritis.  Treat with anti-inflammatories for now.   History of cardiomyopathy with ejection fraction of 40%, improved.  Continue Hyzaar.  Watch volume.  Decrease salt.  P.R.N. Lasix as needed.  If she has to take her Lasix more than 3 times a week, she will call me.  At that point, we would check a BMET and a BNP and see if there is also  a component of diastolic dysfunction.  The patient's hypertension is  well controlled as long as she takes her Hyzaar.  She will follow up  with Dr. Loura Pratt in terms of stopping her Actos and having tighter  sugar control.  I believe her last hemoglobin A1C was in excess of 7.   I will see her back in about 6 months.     [name]. Desiree Cancel, MD, [facility]  Electronically Signed    PCN/MedQ  DD: 05/17/2006  DT: 05/17/2006  Job #: [id number]

## 2010-08-27 NOTE — H&P (Signed)
[name], [name] NO.:  [id number]   MEDICAL RECORD NO.:  [id number]          PATIENT TYPE:  INP   LOCATION:  3729                         FACILITY:  [facility]   PHYSICIAN:  Marletta Lor, M.D. LHCDATE OF BIRTH:  [birth year]   DATE OF ADMISSION:  07/03/2005  DATE OF DISCHARGE:                                HISTORY & PHYSICAL   CHIEF COMPLAINT:  Chest pain.   HISTORY OF PRESENT ILLNESS:  The patient is a 60 year old black female with  a 10-year history of type 2 diabetes.  She was stable until approximately 1  a.m. on the day of admission when she had the onset of chest pain which she  initially felt was indigestion.  She took a Zantac and later developed a  pruritic urticarial rash.  Subsequently she developed more of a sharp, mid  chest pain with radiation to the back.  Due to persistent pain, she  presented to the ED for evaluation.  EKG and initial cardiac markers have  been negative. The patient states that, for the past 2 or 3 weeks, she has  also noted significant dyspnea on exertion.  She was hospitalized in  December 2002 for evaluation of chest pain which was preceded by an abnormal  adenosine stress test.  Cardiac catheterization at that time revealed clean  coronary arteries.  The patient is now admitted for further evaluation and  treatment of her chest pain.   PAST MEDICAL HISTORY:  1.  As mentioned, the patient had a normal heart catheterization by Dr.      Radford Pax in December 2002.  2.  She has a 10-year history of type 2 diabetes.  3.  In 2003, the patient presented with right axillary adenopathy and was      diagnosed with squamous cell carcinoma of unknown primary. In 2005, the      patient underwent a right mastectomy due to squamous cell carcinoma of      the right breast.  The patient has been followed by Dr. Humphrey Rolls and has      received chemotherapy for her squamous cell carcinoma.  4.  The patient had a thyroidectomy in 1975.  5.   She had a right total knee replacement in 2000.  6.  Anterior cervical diskectomy in 2002.  7.  Remote hysterectomy and bilateral salpingo-oophorectomy in 1972.  8.  Gastroesophageal reflux disease.  9.  She was last admitted in January 2006 for gastritis.   Past Medical History is otherwise unremarkable.   FAMILY HISTORY:  Both parents have had some cerebrovascular disease.  Father  also has diabetes.   She is a nonsmoker, gravida 1, para 1, abortus 0.   PHYSICAL EXAMINATION:  VITAL SIGNS: Blood pressure 130/80, pulse rate 72.  GENERAL:  Exam revealed a well-developed, mildly overweight black female in  no acute distress.  She was slightly sedated due to morphine.  HEAD AND NECK:  Revealed normal pupillary responses.  Conjunctivae clear.  ENT unremarkable. The patient did have a scar in the anterior neck area.  No  carotid bruits.  CHEST: Clear.  CARDIOVASCULAR: Exam  revealed normal S1, S2, no murmurs.  BREASTS:  Left breast was normal. The patient had a Port-A-Cath present in  the left anterior chest wall.  She had a right mastectomy, right axilla  clear.  ABDOMEN:  Obese, soft, and nontender, no organomegaly.  EXTREMITIES: No edema, no calf tenderness.  Peripheral pulses were full.   IMPRESSION:  1.  Atypical chest pain.  2.  History of dyspnea on exertion for 2 weeks.  3.  History of squamous cell carcinoma, unknown primary.   DISPOSITION:  1.  The patient will be admitted to the hospital for further evaluation of      her chest pain.  2.  Serial cardiac markers will be monitored.  3.  CT angiogram of the chest will be obtained to exclude a pulmonary      embolism and to further evaluate her chest in view of her history of      carcinoma.           ______________________________  Marletta Lor, M.D. [facility]     PFK/MEDQ  D:  07/03/2005  T:  07/04/2005  Job:  [id number]

## 2010-08-27 NOTE — Op Note (Signed)
NAME:  [name], [name]                       ACCOUNT NO.:  [id number]   MEDICAL RECORD NO.:  [id number]                   PATIENT TYPE:  AMB   LOCATION:  ENDO                                 FACILITY:  [facility]   PHYSICIAN:  James L. Rolla Flatten., M.D.          DATE OF BIRTH:  1950-10-29   DATE OF PROCEDURE:  06/12/2003  DATE OF DISCHARGE:                                 OPERATIVE REPORT   PROCEDURE PERFORMED:  Esophagogastroduodenoscopy.   MEDICATIONS:  Fentanyl 50 mcg, Versed 7 mg  IV, Cetacaine spray.   ENDOSCOPIST:  Joyice Faster. Oletta Lamas, M.D.   INDICATIONS FOR PROCEDURE:  Vague chest discomfort and history of reflux.   DESCRIPTION OF PROCEDURE:  The procedure had been explained to the patient  and consent obtained.  With the patient in left lateral decubitus position,  the Olympus scope was inserted and advanced.  The stomach was entered.  The  pylorus identified and passed.  The duodenum including the bulb and second  portion were seen well and were unremarkable.  The scope was withdrawn back  into the stomach.  The pyloric channel, antrum and body were normal other  than what appeared to be a small lump in the midbody along the greater curve  covered with normal mucosa.  The fundus and cardia were seen well on the  retroflex view and were normal.  The GE junction was widely patent.  There  was a small hiatal hernia consistent with esophageal reflux disease.  The  distal and proximal esophagus were endoscopically normal.  The scope was  withdrawn.  The patient tolerated the procedure well and was maintained on  low flow oxygen and pulse oximeter and monitored throughout the procedure.   ASSESSMENT:  1. Gastroesophageal reflux disease, 530.81.  2. Small submucosal nodule, probable small leiomyoma along the greater     curve.   PLAN:  Will give a trial of Aciphex, a reflux sheet.  See back in the office  in two months.  Will proceed with colonoscopy at this point as  planned.                                               James L. Rolla Flatten., M.D.    Jaynie Bream  D:  06/12/2003  T:  06/12/2003  Job:  [id number]   cc:   Marcy Panning, MD  Fax: [phone]   Harden Mo, M.D.  [address]  [place]  [place] [address]  Fax: [phone]

## 2010-08-27 NOTE — Discharge Summary (Signed)
[name], [name]             ACCOUNT NO.:  [id number]   MEDICAL RECORD NO.:  [id number]          PATIENT TYPE:  INP   LOCATION:  [id number]                         [facility]:  [facility]   PHYSICIAN:  Marcy Panning, MD       DATE OF BIRTH:  1950/10/18   DATE OF ADMISSION:  04/22/2004  DATE OF DISCHARGE:  04/26/2004                                 DISCHARGE SUMMARY   DISCHARGE DIAGNOSES:  1.  Patient with breast cancer, status post mastectomy on gemcitabine and      Navelbine.  2.  Abdominal pain secondary to gastritis and hiatal hernia, resolved.  3.  Esophageal dysmotility on Reglan.  4.  Diabetes.  5.  Hypothyroidism.  6.  Gastrointestinal reflux disease.   PROCEDURES DURING HOSPITALIZATION:  1.  CT scan of abdomen which was essentially negative.  2.  Ultrasound of abdomen which was essentially negative, except for      nephrolithiasis.  3.  Upper gastrointestinal series with small bowel follow through with      preliminary results significant for a small hiatal hernia,      gastrointestinal reflux disease, mild nonspecific esophageal dysmotility      and possible mild enterocolitis.   HISTORY OF PRESENT ILLNESS:  The patient is a 60 year old African American  female with has a history of breast carcinoma, status post mastectomy in  September of 2005 with the final pathology that revealed a 1.1 cm squamous  cell carcinoma, 0/10 lymph nodes positive, grade 3, no LVI, ER/PR negative  and Her2-nu negative.  The patient was begun on systemic chemotherapy with  gemcitabine and carboplatin.  The last dose was given on April 15, 2004.  On April 21, 2004, the patient presented to the emergency room with  abdominal pain which was unrelieved with Pepcid.  CT scan of the abdomen  performed at the time essentially revealed no etiology for her pain.  The  patient was then seen by me on April 22, 2004, in the [facility].  The patient stated that she was very weak and fatigued  with some  nausea.  No fever, vomiting, headaches, visual disturbances, shortness of  breath, chest pain or palpitations.  She did complaint of severe abdominal  pain.  Because of this, she was admitted to [facility] for further  evaluation.   HOSPITALIZATION COURSE:  The patient was admitted to [facility], was made  NPO and begun on IV fluids consisting of D5 normal saline at 125 mL/hr.  She  received complete bowel rest for 24 hours and was NPO.  She continued her  usual medications during her hospitalization.  Protonix was continued at 40  mg IV twice a day.  Notably, the patient's transaminases were slightly  elevated during the hospitalization and it was unclear why.  An ultrasound  obtained of the abdomen revealed no evidence of cause for cause for her  abdominal pain.  However, incidentally she was noted to have  nephrolithiasis.  It was felt that the patient likely had viral gastritis  versus hepatitis.  Amylase and lipase levels were normal.  Over the  subsequently days, the patient's pain improved.  She required less and less  IV morphine.  On the day of discharge, the patient had an upper GI series  with small bowel follow through which essentially revealed small hiatal  hernia with gastrointestinal reflux disease and some mild nonspecific  esophageal dysmotility.  Because the patient is clinically much improved,  she is being discharged to home with her husband today.   DISCHARGE MEDICATIONS:  1.  Protonix 40 mg twice a day.  2.  Maalox 30 mL twice a day.  3.  Dilaudid 2 mg every six hours as needed.  4.  Senna two tablets two times a day as needed.  5.  Glipizide 10 mg b.i.d.  6.  Levothyroxine 1.5 mg daily.  7.  Metformin 850 mg b.i.d.  8.  Nortriptyline 25 mg.  9.  Actos 20 mg.  10. Coumadin 1 mg daily.  11. K-Dur 20 mEq once a day.  12. Carafate 1 g one tablet four times a day.  13. Reglan 10 mg twice a day.   PAIN MANAGEMENT:  The patient is to use  Dilaudid on an as needed basis.   ACTIVITY:  As tolerated.   DIET:  The patient is very clearly instructed to avoid spicy foods, as well  as fatty foods, tomato sauces and caffeinated products.  She is to have a  bland diet which would consist of rice, boiled vegetables and baked chicken  without fat.   WOUND CARE:  Not applicable.   SPECIAL INSTRUCTIONS:  The patient is to call with any fevers, nausea,  vomiting or recurrence of her abdominal pains.   FOLLOWUP:  Followup is arranged for her on April 28, 2004, and on April 29, 2004, the patient will get chemotherapy.   CONDITION ON DISCHARGE:  Improved.      KK/MEDQ  D:  04/26/2004  T:  04/26/2004  Job:  [id number]

## 2010-08-27 NOTE — Op Note (Signed)
NAME:  [name], [name]                       ACCOUNT NO.:  [id number]   MEDICAL RECORD NO.:  [id number]                   PATIENT TYPE:  OIB   LOCATION:  5704                                 FACILITY:  [facility]   PHYSICIAN:  Shellia Carwin, M.D.              DATE OF BIRTH:  August 01, 1950   DATE OF PROCEDURE:  12/29/2003  DATE OF DISCHARGE:                                 OPERATIVE REPORT   PROCEDURE:  Right modified radical mastectomy.   SURGEON:  Shellia Carwin, M.D.   ASSISTANT:  Rudell Cobb. Annamaria Boots, M.D.   ANESTHESIA:  General.   PREOPERATIVE DIAGNOSIS:  Cancer, right breast.   POSTOPERATIVE DIAGNOSIS:  Cancer, right breast.   CLINICAL SUMMARY:  The patient is a 60 year old female with biopsy proven  carcinoma of the right breast who comes in for a mastectomy.  Approximately  two years ago, she had a right axillary mass that was excised by myself  showing squamous carcinoma.  Extensive workup did not reveal any primary.  She was treated for an unknown primary with squamous metastases with  chemotherapy.  She has been followed carefully and has now developed a mass  in the right breast on ultrasound and mammogram.  It was biopsied and shows  a cancer.  She has had discussion regarding therapy and prefers a  mastectomy.   OPERATIVE FINDINGS:  I did not encounter the mass in the breast having gone  widely around it.  The axillary vein and major nerves were identified and  not injured.  There were a few nodes in the axilla that were palpable, but  not grossly malignant.  Final pathology awaiting.   DESCRIPTION OF PROCEDURE:  Under satisfactory general endotracheal  anesthesia the patient was positioned, prepped and draped in a standard  fashion.  A transversely oriented elliptical incision was made from the  midline to the anterior axillary line.  Flaps were developed using cautery  to the clavicle above, midline medially, inferiorly to the abdominal wall  and laterally to the  latissimus muscle.  The breast was quite large and the  patient was quite heavy and the dissection was somewhat tedious, but no  complications were encountered.  The breast was then removed from the  pectoralis muscle using cautery and bleeding was controlled with metal clips  or cautery.  Then the pectoralis major and medial were reflected medially  and identifying the axillary vein, all tissue from the apex of the axilla  out to the thoracodorsal vessels was swept away again using cautery or clips  for hemostasis. The  thoracodorsal nerve and long thoracic were identified and not injured. The  specimen was then removed.  The wound was lavaged with saline. Hemostasis  was obtained and then closed with staples after two Blake drains were  placed.  There were no complications.  The patient was taken to the recovery  room in good condition.  MRL/MEDQ  D:  12/29/2003  T:  12/29/2003  Job:  [id number]   cc:   Marcy Panning, MD  Fax: [phone]   Autumn Patty, M.D.

## 2010-08-27 NOTE — Op Note (Signed)
[place]. [facility]  Patient:    [name], [name] Visit Number: [id number] MRN: [id number]          Service Type: DSU Location: [facility] Attending Physician:  Harlin Rain Dictated by:   Shellia Carwin, M.D. Proc. Date: 10/26/01 Admit Date:  10/26/2001 Discharge Date: 10/26/2001                             Operative Report  PREOPERATIVE DIAGNOSIS:  Mass of right axilla.  POSTOPERATIVE DIAGNOSIS:  Mass of right axilla, probably lymph nodes.  OPERATIVE PROCEDURE:  Excisional biopsy of right axillary mass.  SURGEON:  Shellia Carwin, M.D.  ANESTHESIA:  General.  CLINICAL SUMMARY:  A 60 year old female with a self-found lump in her axilla. She has no symptoms from it.  A mammogram was obtained as well as ultrasound showing no lesions in the breasts.  The radiologist requested biopsy.  OPERATIVE FINDINGS:  There was a firm rounded mass in the lowest most portion of the right axilla, right at the edge of the breast tissue.  It was low encapsulated and was not fluctuant.  It measured approximately 3 cm.  DESCRIPTION OF PROCEDURE:  Under satisfactory general endotracheal anesthesia, the patient was positioned, prepped, and draped in the standard fashion.  The region was approached through a curved lateral breast - axillary incision and carried down through the superficial fascia.  The lesion was easily palpated and finger dissection used to free it.  Bleeders were coagulated or tied with 3-0 Vicryl or metal clips.  The entire specimen was removed without difficulty or complications.  It was sent for study and also hormone receptors if malignant.  The wound meanwhile was lavaged with saline, made hemostatic, and then closed in layers with 3-0 Vicryl and 4-0 nylon.  Sterile absorbent dressings were then applied and the patient went to the recovery room from the operating room in good condition without complication. Dictated by:   Shellia Carwin, M.D. Attending Physician:  Harlin Rain DD:  10/26/01 TD:  10/31/01 Job: [id number]

## 2010-08-27 NOTE — H&P (Signed)
[name], TONI NO.:  [id number]   MEDICAL RECORD NO.:  [id number]          PATIENT TYPE:  INP   LOCATION:  [id number]                         FACILITY:  [facility]   PHYSICIAN:  Marcy Panning, MD       DATE OF BIRTH:  [birth year]   DATE OF ADMISSION:  04/22/2004  DATE OF DISCHARGE:                                HISTORY & PHYSICAL   REASON FOR ADMISSION:  A 60 year old female being admitted with severe  abdominal pain.   HISTORY OF PRESENT ILLNESS:  The patient is a 60 year old African-American  female who originally was diagnosed in 2003 with squamocolumnar of unknown  primary. She initially presented as a right axillary lymphadenopathy.  She  underwent combination chemotherapy consisting of four cycles of carboplatin  and Taxol. She completed this in December 2003. Thereafter she was followed  very closely radiographically and clinically with mammogram and CT scans.  In August 2005, the patient had a screening mammogram performed and was  found to have a new mass in the right breast.  She had a biopsy done by Dr.  Rebekah Chesterfield and the final pathology was potted for an invasive squamous cell  carcinoma.  On December 29, 2003, the patient had a mastectomy with  axillary lymph node dissection.  The final pathology revealed a 1.1 cm  squamous cell carcinoma 0 of 10 lymph nodes positive for metastatic disease,  grade 3, no lymphovascular invasion, ER/PR negative, HER2/neu negative.  She  was thereafter begun on systemic chemotherapy consisting of gemcitabine and  carboplatin. Her last dose was given on April 15, 2004.  The patient did  well for the first week and then subsequently this week began complaining of  severe abdominal pain yesterday unrelieved by Pepcid.  Because of this, the  patient was sent to the emergency room on April 21, 2004.  CT of the  abdomen performed in the emergency room revealed no etiology for the  abdominal pain.  It was felt that she had  gastritis. She was given protonix  40 mg twice a day.  However, in spite of the protonix, the patient continues  to have severe abdominal pain and she is now being admitted for abdominal  pain, occasional nausea and dehydration and she has not been eating or  drinking much. She has not had any fevers, chills, nausea, vomiting,  headaches, visual disturbances, shortness of breath, chest pain,  palpitations, no musculoskeletal discomfort other than her severe abdominal  pain, no dizziness.  No tingling and numbness in her extremities.   PAST MEDICAL HISTORY:  Significant for diabetes, hypothyroidism, history of  gastritis.   CURRENT MEDICATIONS:  1.  Acetaminophen 5 mg.  2.  Cyclobenzaprine 10 mg.  3.  Diazepam 5 mg p.r.n.  4.  Estradiol 1 mg q.d.  5.  Glipizide 10 mg b.i.d.  6.  Levothyroxine 1.5 mg q.d.  7.  Metformin 850 mg b.i.d.  8.  Nortriptyline 25 mg.  9.  Protonix 40 mg q.d.  10. Actos 20 mg.  11. Warfarin sodium 1 mg q.d.   SOCIAL HISTORY:  The patient does not  smoke or drink.   PAST SURGICAL HISTORY:  The patient is status post a mastectomy.   FAMILY HISTORY:  Noncontributory.   PHYSICAL EXAMINATION:  GENERAL:  The patient is a tired appearing female  complaining of severe abdominal pain, inability to lie down.  VITAL SIGNS:  Temperature is 97, pulse 90, respirations 24, blood pressure  143/92, weight 224 pounds.  HEENT:  Oral mucosa is moist, the oropharynx is clear.  No palpable  adenopathy.  LUNGS:  Clear.  CARDIOVASCULAR:  Regular rate and rhythm but tachycardic.  ABDOMEN:  No guarding.  There is epigastric tenderness as well as tenderness  in the right upper quadrant.  No rebound.  EXTREMITIES:  No edema.  NEUROLOGIC:  Alert and oriented. DTR's are +4, motor and sensory is intact.  Strength is symmetrical.   LABORATORY DATA:  WBC 12.2, hemoglobin 13.6, hematocrit 41.6, platelets  139,000.  Electrolytes, sodium 138, potassium 4.0, chloride 103, bicarb 27,   glucose 213, BUN 4, creatinine 0.7, total bilirubin is 0.9, alkaline  phosphatase 127, AST 84, ALT 104.   ASSESSMENT/PLAN:  A 60 year old female with history of squamous cell  carcinoma of the breast status post first cycle of Gemzar and Navelbine now  presenting with acute abdominal pain.  The patient was seen in the emergency  room on April 21, 2004 at which time she was diagnosed with gastritis and  sent home with protonix twice a day.  CT scans performed in the emergency  room were nonrevealing for an acute abdomen.  Liver, spleen, pancreas and  adrenal glands were all normal. There was no adenopathy. Abdominal aorta  size was normal. The patient did have bilateral renal calculi which were  stable.  CT of the pelvis also was unrevealing.  Chest x-ray was without any  evidence of any acute active cardiopulmonary disease and abdominal x-ray did  not reveal any specific abdominal findings that is no free air or any acute  or specific abnormalities of the bowel or gas pattern.  The patient is being  admitted:   1.  For IV hydration.  We will begin IV fluids consisting of D5 normal      saline at 125 mL an hour.  2.  Abdominal pain.  The patient will be n.p.o. at least for the next 24      hours for bowel rest.  She will continue her oral medications; however.      We will continue protonix given IV twice a day. The patient's      transaminases are elevated. This is of unclear etiology. I will obtain      and ultrasound of the right upper quadrant. The likelihood is patient      may have a viral gastroenteritis versus acute hepatitis.  We will      continue to monitor this and if the need arises have GI consult on the      patient.  I will also get amylase and lipase levels. For nausea, she      will receive antinausea medications and supportive care.     [place]   KK/MEDQ  D:  04/22/2004  T:  04/22/2004  Job:  [id number]

## 2010-08-27 NOTE — Op Note (Signed)
NAME:  [name], [name]                       ACCOUNT NO.:  [id number]   MEDICAL RECORD NO.:  [id number]                   PATIENT TYPE:  OIB   LOCATION:  NA                                   FACILITY:  [facility]   PHYSICIAN:  Early Chars. Wilburn Cornelia, M.D.            DATE OF BIRTH:  September 09, 1950   DATE OF PROCEDURE:  12/19/2001  DATE OF DISCHARGE:                                 OPERATIVE REPORT   PREOPERATIVE DIAGNOSES:  1. Metastatic squamous cell carcinoma to the right axilla.  2. Lingual tonsillar hypertrophy.   POSTOPERATIVE DIAGNOSES:  1. Metastatic squamous cell carcinoma to the right axilla.  2. Lingual tonsillar hypertrophy.   PROCEDURE:  Direct laryngoscopy and biopsy of the base of tongue.   ANESTHESIA:  General endotracheal.   SURGEON:  Early Chars. Wilburn Cornelia, M.D.   COMPLICATIONS:  None.   ESTIMATED BLOOD LOSS:  Minimal.   DISPOSITION:  Patient transferred from the operating room to the recovery  room in stable condition.   NOTE:  Shellia Carwin, M.D., of the general surgery service is performing  Port-A-Cath placement, and this will be dictated in a separate operative  procedure.   BRIEF HISTORY:  The patient is a 60 year old black female who was seen by  Dr. Rebekah Chesterfield for a gradually enlarging mass in the right axillary region.  She  underwent excisional biopsy of the mass, and pathology revealed metastatic  squamous cell carcinoma.  The patient has undergone extensive workup to  identify the primary source.  No apparent source for the metastatic disease  has been identified at this time.  She underwent a PET scan, which showed  uptake and enhancement in the base of tongue.  Fiberoptic laryngoscopy  performed in my office showed lingual tonsillar hypertrophy, no evidence of  ulceration, mass, or lesion.  Given the pathology of the metastatic disease,  a head and neck primary source is a possibility, and direct laryngoscopy  with biopsy of the base of tongue was  recommended in order to potentially  identify a primary source for the tumor.  The risks, benefits, and possible  complications of this procedure were discussed in detail with the patient  and her husband.  They understood and concurred with our plan for surgery,  which was scheduled for 12/19/01.   DESCRIPTION OF PROCEDURE:  The patient was brought to the operating room at  [facility] main OR on 12/19/01 and placed in the supine position on  the operating table.  General endotracheal anesthesia was established  without difficulty.  When the patient was adequately anesthetized, her oral  cavity and oropharynx were examined.  There were no loose or broken teeth.  Hard and soft palate were intact.  There was no evidence of mucosal lesion,  mass, or ulceration.  The patient had 1+ cryptic tonsils without evidence of  discharge or erythema.  The oral cavity was normal.  The supraglottis and  base  of tongue appeared normal.  No evidence of mass or infiltrating lesion.  The patient was noted to have bilateral lingual tonsillar hypertrophy as  previously noted.  The remainder of the laryngoscopy was normal, including  the hypopharynx, piriform sinus, supraglottis, and larynx.  No evidence of  mucosal lesion, ulceration, or mass was noted.  Using large cup forceps,  multiple biopsies were taken of the lingual tonsil tissue and base of tongue  bilaterally.  These were sent separately to pathology for gross and  microscopic evaluation.  There was minimal bleeding.  The patient's oral  cavity and oropharynx were suctioned, and an orogastric tube was passed.  The stomach contents were aspirated.  The procedure was completed by Dr.  Magdalene River with placement of a Port-A-Cath, and this will be dictated as  a separate procedure.  At the conclusion, the patient was awakened from her  anesthetic.  She was extubated and was transferred from the operating room  to the recovery room in stable  condition.                                               Early Chars. Wilburn Cornelia, M.D.    DLS/MEDQ  D:  [id number]  T:  12/20/2001  Job:  [id number]   cc:   Shellia Carwin, M.D.  Fax: [phone]   Darrick Grinder. Claris Gladden, M.D.  [address] [place] [address]  Fax: [phone]

## 2010-08-27 NOTE — Op Note (Signed)
   NAME:  [name], [name]                       ACCOUNT NO.:  [id number]   MEDICAL RECORD NO.:  [id number]                   PATIENT TYPE:  AMB   LOCATION:  [place]                                  FACILITY:  [facility]   PHYSICIAN:  Shellia Carwin, M.D.              DATE OF BIRTH:  Aug 21, 1950   DATE OF PROCEDURE:  05/13/2002  DATE OF DISCHARGE:                                 OPERATIVE REPORT   OPERATION PERFORMED:  Removal of indwelling Port-A-Cath venous reservoir  system.   SURGEON:  Shellia Carwin, M.D.   ANESTHESIA:  MAC IV sedation, local 1% Xylocaine.   PREOPERATIVE DIAGNOSIS:  Indwelling Port-A-Cath, post chemotherapy for  metastatic cancer to the right axilla with unknown primary.   POSTOPERATIVE DIAGNOSIS:  Indwelling Port-A-Cath, post chemotherapy for  metastatic cancer to the right axilla with unknown primary.   INDICATIONS FOR PROCEDURE:  The patient had had a Port-A-Cath placed by my  myself in September 2003.  It was used for chemotherapy for the above  diagnosis.  She has completed her course and no longer needs it.   OPERATIVE FINDINGS:  The Port-A-Cath was removed intact and without  complication.   DESCRIPTION OF PROCEDURE:  Under satisfactory intravenous sedation, the  patient was positioned, prepped and draped in a standard fashion.  Approximately 30 cc of 1% Xylocaine was infiltrated throughout for good  analgesia.  The previous incision was reopened and dissection carried down  to the reservoir.  The pocket was opened and the sutures holding it in place  were all cut and removed.  Then the reservoir with the attached catheter was  as removed intact without complication.  Bleeders were coagulated and the  wound closed in layers with 3-0 Vicryl and  Steri-Strips for skin.  Sterile absorbent compressive dressing applied.  No  complications.  Patient to the recovery room in good condition.                                                Shellia Carwin,  M.D.    MRL/MEDQ  D:  05/13/2002  T:  05/13/2002  Job:  [id number]   cc:   Darrick Grinder. Claris Gladden, M.D.  [address] [place] [address]  Fax: [place] Marisue Humble, M.D.  [place]. [place]  [place] [address]  Fax: [phone]

## 2010-08-27 NOTE — Op Note (Signed)
[place]. [facility]  Patient:    [name], [name]                      MRN: [id number] Proc. Date: 08/31/00 Attending:  Grayland Jack., M.D.                           Operative Report  PREOPERATIVE DIAGNOSIS:  Left C5 radiculopathy from cervical spondylosis at C4-5.  PROCEDURE:  Anterior cervical diskectomy and fusion at C4-5 with a 7 mm fibular allograft and a 23 mm Atlantis plate with four 13 mm screws.  SURGEON:  Grayland Jack., M.D.  ASSISTANT:  Faythe Ghee, M.D.  ANESTHESIA:  General endotracheal.  CLINICAL HISTORY:  The patient is a very pleasant 60 year old female who has longstanding neck and left shoulder pain.  It has gotten progressively worse over the last several months.  Preoperative imaging revealed osteophytic spur formation and uncinate hypertrophy compressing the left C5 nerve root.  This is felt to be the etiology of her pain.  She had failed physical therapy and nonsteroidals, anti-inflammatories, and tincture of time.  The patient now presents for a diskectomy.  The risks and benefits of surgery were extensively explained to the patient.  She understands and agrees to proceed.  DESCRIPTION OF PROCEDURE:  The patient was brought into the OR, was induced under general anesthesia, positioned supine, with a shoulder roll and her neck in slight extension in five pounds of Holter traction.  The right side of her neck was prepped and draped in routine sterile fashion.  Preoperative imaging localized a needle mark over the C4-5 vertebral interspace.  Curvilinear incision was made just off the midline to the anterior border of the sternocleidomastoid with a 20 blade scalpel.  The superficial layer of the platysma was dissected out and divided longitudinally.  The avascular plane between the sternocleidomastoid and the strap muscles was divided down to the prevertebral fascia.  Prevertebral fascia was dissected away with Kitners.   A needle was placed in the interspace that was later confirmed to be the C4-5 interspace.  The longus colli was then reflected laterally.  The self-retaining retractor was placed.  Annulotomy was made with an 11 blade scalpel, pituitary rongeurs were used to remove the anterior margin of the annulus.  Using the [facility] and FA curettes, the remainder of the cartilaginous end plate and disk space was removed.  Using a high-speed drill, this was drilled down to the posterior osteophyte.  Then using a 1 and 2 mm Kerrison punch, a large osteophytic spur was removed from underneath the C4 vertebral body central and to the left, as well as some uncinate hypertrophy off the C5 vertebral body, and this as noted to be compressive about the proximal aspect of the C5 nerve root.  After we removed in a piecemeal fashion, the posterior longitudinal ligament was identified, removed in piecemeal fashion, then the C5 nerve root was radically decompressed.  This was explored with an angled nerve hook and noted to have no further compression.  The remainder of the ligament was cleaned off the end plates, and attention was taken to the right side.  The right C5 nerve root was opened up in piecemeal fashion with a 1 and 2 mm Kerrison punch and also explored with a nerve hook and noted to be under no compression.  The end plates were evened off and prepared for arthrodesis. The wound was  copiously irrigated.  A 7 mm fibular allograft was inserted after the interbody spreader had been placed, then the operating microscope was removed from the field, and a 23 mm Atlantis plate was sized, selected, drilled, tapped, and 13 mm screws were inserted at C4 and C5 bilaterally.  Set screws were tightened.  The wound as copiously irrigated. Meticulous hemostasis was maintained.  Postoperative x-ray confirmed good localization of the plate and bone graft.  Then the platysma was approximated with 3-0 interrupted Vicryl, the skin  was closed with running 4-0 subcuticular, benzoin and Steri-Strips were applied.  The patient went to the recovery room in stable condition.  At the end of the case, all needle counts and sponge counts correct. DD:  08/31/00 TD:  08/31/00 Job: [id number]

## 2010-08-27 NOTE — H&P (Signed)
[name]. [facility]  Patient:    Desiree, Pratt Visit Number: [id number] MRN: [id number]          Service Type: MED Location: [id number] Attending Physician:  [name] A Dictated by:   [name], N.P. Admit Date:  04/02/2001   CC:         [name], M.D.   History and Physical  PRIMARY CARE [name]:  [name], M.D. PRIMARY CARDIOLOGIST:  [name], M.D. DATE OF BIRTH:  September 19, 1950.  IMPRESSION (as dictated by Dr. Fransico Him): 1. Symptoms of unstable angina with recent prior abnormal stress in this    60 year old diabetic female. 2. Hypothyroidism, supplements. 3. Diabetes, on Glucovance. 4. Arthritis and disk disease; status post right total knee replacement    surgery in 2000, prior anterior cervical diskectomy, and due for some lower    back surgery for herniated disk in the future (Dr. Saintclair Halsted).  PLAN (as dictated by Dr. Fransico Him): 1. Admit to telemetry, rule out MI with serial cardiac enzymes and daily EKGs. 2. IV heparin and nitroglycerin drip. 3. Oral aspirin, Plavix, and beta blockers. 4. Hold Glucophage portion of Glucovance. 5. Schedule for coronary angiography first case tomorrow morning.  Risks,    potential complications, benefits, and alternatives to the procedure    discussed in detail.  [name] indicates her questions and concerns have    been addressed and is agreeable to proceed. 6. Will recheck platelet count prior to catheterization procedure.  HISTORY OF PRESENT ILLNESS:  [name] is a very pleasant 60 year old female with history of diabetes but no cardiac history.  She was initially evaluated by Dr. Jaci Standard November 22 for low-grade left-sided chest discomfort, which intensified with activity.  No associated symptoms.  She had a stress Cardiolite on December 20 suspicious for anterior apical ischemia with normal LV function.  Three days prior to this admission,  patient felt recurrent chest pain, which resolved with sublingual nitrate.  She states she had more chest discomfort, unrelieved with a total of three sublingual nitrates, and thus presented to the emergency room.  Additional sublingual nitrate seemed to improve discomfort to a 2/10.  She had associated left arm discomfort this morning, though this is exacerbated with movement of that left arm and also with applied pressure to the left shoulder.  She is on IV nitrates, heparin, and is status post Plavix and aspirin.  PAST MEDICAL HISTORY: 1. Hypothyroidism, status post thyroidectomy in 1975.  On supplements. 2. May 2000, right total knee replacement; prior right arthroscopic surgery    in that knee. 3. Hysterectomy with BSO in 1972. 4. May 2002, anterior cervical diskectomy with fusion, C4-C7, also plate and    screw (Dr. Saintclair Halsted). 5. Due for lower back surgery by Dr. Saintclair Halsted for herniated disk repair pending    cardiology clearance.  ALLERGIES:  AMPICILLIN, causing rash.  MEDICATIONS:  Estradiol 1 mg p.o. q.d., Synthroid 0.175 mg p.o. q.d., nortriptyline 25 mg p.o. q.h.s., Paxil 10 mg q.d. p.r.n., Tylenol q.i.d. p.r.n., Glucovance 5/500 mg p.o. q.d.  SOCIAL HISTORY:  The patient is married for 31 years, is employed as a Secretary/administrator.  She has one daughter, age 13, alive and well.  Tobacco and ETOH: Negative.  No recreational drugs.  FAMILY HISTORY:  Both parents have had CVA.  Both parents had their strokes sometime after back surgery.  Her mother is 68, dad is also in his 13s.   Her  dad does have diabetes.  The patient has one sister and four brothers, all alive and well.  REVIEW OF SYSTEMS:  As in HPI/previous medical history.  Otherwise, denies problem with lightheadedness, syncope, near-syncopal episodes.  Does notice some episodic dysphagia when taking large bites of food and some problems with drinking fluids.  She has not mentioned this to Dr. Marisue Humble.  Negative symptoms of  GERD currently.  Denies melena, bright red blood p.r., constipation, or diarrhea.  Negative dysuria nor hematuria.  She does have joint pain, including her right knee and low back, discomfort sounding like sciatica radiating into the left leg.  Occasional headaches.  Problems with depression/anxiety, which is very well-controlled with p.r.n. Paxil.  PHYSICAL EXAMINATION (as performed by Dr. Fransico Him):  VITAL SIGNS:  Blood pressure is 130/68, heart rate 86 and regular.  She is afebrile.  GENERAL:   She is an overweight female in no apparent distress.  Her husband is in attendance.  HEENT:  Good bilateral carotid upstroke without bruit.  NECK:  Negative JVD, no thyromegaly.  CHEST:  Lung sounds clear with good bilateral excursion.  Negative CVA tenderness.  CARDIAC:  Regular rate and rhythm without murmur, rub, nor gallop.  Normal S1 and S2.  ABDOMEN:  Soft, obese, normoactive bowel sounds.  Negative abdominal aortic, renal, or femoral bruit.  Nontender to applied pressure.  No masses, no organomegaly.  EXTREMITIES:  Distal pulses intact.  Negative pedal edema.  NEUROLOGIC:  Cranial nerves II-XII grossly intact.  Alert and oriented x 3.  GENITAL/RECTAL:  Deferred.  LABORATORY DATA:  Sodium 141, potassium 3.9, chloride 111, CO2 25, BUN 7, creatinine 0.6, and glucose 126.  Hemoglobin 13.3, hematocrit of 39.2, WBC 9.4, platelets were clumped and unable to be counted.  Protime of 13, INR of 1.0, PTT of 21.  CK 93, MB fraction 2.6, troponin I is less than 0.01.  EKG revealed NSR without ischemic changes.  Chest x-ray:  Unable to locate at this time. Dictated by:   [name], N.P. Attending Physician:  Rutherford Limerick DD:  04/02/01 TD:  04/03/01 Job: [id number]

## 2010-08-27 NOTE — Op Note (Signed)
NAME:  [name], [name]                       ACCOUNT NO.:  [id number]   MEDICAL RECORD NO.:  [id number]                   PATIENT TYPE:  AMB   LOCATION:  ENDO                                 FACILITY:  [facility]   PHYSICIAN:  James L. Rolla Flatten., M.D.          DATE OF BIRTH:  [birth year]   DATE OF PROCEDURE:  06/12/2003  DATE OF DISCHARGE:                                 OPERATIVE REPORT   PROCEDURE PERFORMED:  Colonoscopy.   ENDOSCOPIST:  Joyice Faster. Edwards, M.D.   MEDICATIONS:  The patient received a total of fentanyl 90 mcg and Versed 9  mg for both procedures.   INSTRUMENT USED:  Pediatric Olympus video colonoscope.   INDICATIONS FOR PROCEDURE:  Rectal bleeding in a woman with strong family  history of colon polyps in her brother.   DESCRIPTION OF PROCEDURE:  The procedure had been explained to the patient  and consent obtained.  With the patient in the left lateral decubitus  position, the pediatric adjustable Olympus video colonoscope was inserted  and advanced.  The prep was excellent and we were able to reach the cecum  without difficulty.  The ileocecal valve and appendiceal orifice were seen.  The scope was withdrawn and the cecum, ascending colon, transverse colon,  splenic flexure, descending and sigmoid colon were seen well upon removal.  No polyps were seen.  The scope was withdrawn down in the rectum and the  rectum was free of polyps.  There were internal hemorrhoids.  Scope  withdrawn.  The patient tolerated the procedure well.   ASSESSMENT:  1. Rectal bleeding 578.1.  Probably due to internal hemorrhoids.  2. Family history of colon polyps, V19.8.   PLAN:  Will give a hemorrhoid instruction sheet.  Recommend repeating colon  in five years.                                               James L. Rolla Flatten., M.D.    Jaynie Bream  D:  06/12/2003  T:  06/12/2003  Job:  [id number]   cc:   Marcy Panning, MD  Fax: [phone]   Harden Mo, M.D.  [address]  [place]  [place] [address]  Fax: [phone]

## 2010-08-27 NOTE — Assessment & Plan Note (Signed)
[facility] OFFICE NOTE   AZHA, SCHERMERHORN                    MRN:          [id number]  DATE:01/05/2006                            DOB:          03-Sep-1950    [name] returns for followup. She is a diabetic who has been exertional  chest pain and shortness of breath. Her heart cath normal right sided  pressures and no evidence of coronary artery disease and low normal  injection fraction of 40%. She has been on Hyzaar, we were to re-check her  heart function in November possibly with cardiac MRI, she does have a right  knee replacement and some screws in her cervical spine, but I do not think  this would be a contrary indication. As a matter of fact, I think she just  had an MRI recently of another body part.   The patient has had previous chemotherapy there she has had an abnormal CAT  scan in the past, __________  some focal infiltrates for lung disease. I  really do not think that her chest pain and dyspnea is cardiac in etiology.  She may have some volume overload from her Actos and we talked a little bit  about this.   I also will give her a prescription for generic Albuterol inhaler to see if  there is any broncho spastic component to her systems.   PHYSICAL EXAMINATION:  GENERAL: She is overweight, she has had her porter-  cath removed since I last saw her.  VITAL SIGNS: Blood pressure 118/67, pulse 65 and regular, weight is stable  at 233.  SKIN: Warm and dry.  Carotids are normal, there is no thyromegaly.  Distal pulses are intact with no edema.   MEDICATIONS:  1. Metformin 850 mg t.i.d.  2. Glipizide 10 mg b.i.d.  3. Lantus at night.  4. Actos 45 mg daily.  5. Protonix.  6. Hyzaar 50 __________  7. Synthroid 100 mcg daily.   IMPRESSION:  Continued chest pain and shortness of breath with exertion, no  coronary artery disease on cath and normal right sided pressures. I question  low normal injection fraction and myocardial myopathy.   The patient will have a followup MRI in November, we will continue her ARB,  she will let me know if the Albuterol helps her symptoms.            ______________________________  Wallis Bamberg Johnsie Cancel, MD, [facility]     PCN/MedQ  DD:  01/05/2006  DT:  01/07/2006  Job #:  [id number]

## 2010-08-27 NOTE — Op Note (Signed)
NAME:  [name], [name]                       ACCOUNT NO.:  [id number]   MEDICAL RECORD NO.:  [id number]                   PATIENT TYPE:  OIB   LOCATION:  NA                                   FACILITY:  [facility]   PHYSICIAN:  Shellia Carwin, M.D.              DATE OF BIRTH:  07-18-1950   DATE OF PROCEDURE:  12/19/2001  DATE OF DISCHARGE:                                 OPERATIVE REPORT   PREOPERATIVE DIAGNOSIS:  Metastatic carcinoma to the right axilla, squamous  cell, site undetermined.   POSTOPERATIVE DIAGNOSIS:  Metastatic carcinoma to the right axilla, squamous  cell, site undetermined.   PROCEDURE:  Insertion of Port-A-Cath following Dr. Victorio Palm ENT  procedure, which is described separately.   SURGEON:  Shellia Carwin, M.D.   ANESTHESIA:  General.   CLINICAL HISTORY:  A 60 year old female who underwent right axillary biopsy  of a large lymph node by excision.  This was approximately one month ago.  It came back as squamous cell carcinoma.  She has had multiple studies, and  no evidence of a primary has been found.  She will require chemotherapy.   OPERATIVE FINDINGS:  After Dr. Wilburn Cornelia performed his endoscopies and  biopsies, I placed the Port-A-Cath.  It went into the vein easily and led  down well.  I had a little difficulty because of some redundant catheter,  but this gradually was taken care of and at the end, there was excellent  blood flow and good position by fluoroscopy.  Chest x-ray to be done.   DESCRIPTION OF PROCEDURE:  Under satisfactory general endotracheal  anesthesia, following Dr. Wilburn Cornelia, I repositioned and prepped the patient  for subclavian insertion and Port-A-Cath placement.  A left subclavian  puncture was made and the J-wire inserted and noted to be in good position  by fluoroscopy.  A transverse incision made along the left chest wall high,  below the clavicle, and the subcu dissected to make a pocket for the  reservoir.  The  catheter was cut to the appropriate length and brought  through the tunnel and hooked to the reservoir.  The system was flushed with  saline.  The reservoir was anchored with two 0 Prolene sutures.  Following  this, the dilator was placed over the J-wire and then withdrawn and then the  introducer placed over the dilator and both over the J-wire.  At this point  fluoroscopy was done to confirm good position and also length of the  catheter.  The dilator and J-wire were removed, leaving the introducer in  place.  The catheter was then inserted through the introducer, which was  peeled away, leaving it in good position.  Then it was noted that there was  a little redundancy of the catheter in the subcu space, and the subclavian  site was extended and the catheter then advanced and the reservoir resutured  to allow good position and no  kinking.  This was evident by excellent blood  flow and infusion as well as fluoroscopy.  Then the system was flushed with concentrated heparin and the wounds closed  in layers with 4-0 Vicryl and Steri-Strips.  Sterile absorbent dressings  were applied.  A chest x-ray will be obtained.  The patient will be  discharged and followed as an outpatient.                                               Shellia Carwin, M.D.    MRL/MEDQ  D:  12/19/2001  T:  12/20/2001  Job:  [id number]   cc:   Shanon Brow L. Wilburn Cornelia, M.D.   Robert C. Claris Gladden, M.D.  [address] [place] [address]  Fax: [phone]

## 2010-08-27 NOTE — Cardiovascular Report (Signed)
[facility]  Patient:    [name], [name] Visit Number: [id number] MRN: [id number]          Service Type: MED Location: [phone] Attending Physician:  Rutherford Limerick Dictated by:   Fransico Him, M.D. Proc. Date: 04/03/01 Admit Date:  04/02/2001 Discharge Date: 04/03/2001   CC:         Fabio Asa, M.D.  Dr. Alroy Dust   Cardiac Catheterization  PROCEDURES PERFORMED: 1. Left heart catheterization. 2. Coronary angiography. 3. Left ventriculography.  OPERATOR:  Fransico Him, M.D.  INDICATIONS:  Chest pain.  COMPLICATIONS:  None.  IV MEDICATIONS:  Versed 1 mg.  IV ACCESS:  Via right femoral artery 6-French sheath.  HISTORY:  This is a 60 year old, obese, black female with a history of diabetes, who has been having some episodic chest pain.  She was evaluated by Dr. Jaci Standard and was found to have an abnormal Cardiolite scan, reportedly with anteroapical ischemia, normal wall motion, and ejection fraction of 56%.  The patient presented to the emergency room with intermittent chest pain over the weekend and now presents for cardiac catheterization.  She ruled out for myocardial infarction by serial cardiac enzymes.  DESCRIPTION OF PROCEDURE:  The patient was brought to the cardiac catheterization laboratory in the fasting nonsedated state.  Informed consent was obtained.  The patient was connected to continuous heart rate and pulse oximetry monitoring with intermittent blood pressure monitoring.  The right groin was prepped and draped in a sterile fashion.  One percent Xylocaine was used for local anesthesia.  Using the modified Seldinger technique, a 6-French sheath was placed in the right femoral artery.  Under fluoroscopic guidance, a 6-French JL-4 catheter was placed into the left coronary artery.  Multiple cine films were taken in the 30 degree RAO and 40 degree LAO views.  This catheter was then exchanged out over a guide  wire for a 6-French JR-4 catheter which was placed in the right coronary artery.  Multiple cine films were taken in a 30 degree RAO and 40 degree LAO view.  This catheter was then exchanged out over a guide wire for a 6-French angled pigtail catheter which was placed in the left ventricular cavity.  Left ventriculography was performed in the 30 degree RAO view using a total of 24 cc of contrast at 12 cc/sec.  The catheter was then pulled back across the aortic valve without any significant gradient.  At the end of the procedure, all catheters and sheaths were removed.  Manual compression was performed until adequate hemostasis was obtained.  The patient was transferred back to the room in stable condition.  RESULTS: 1. Left main coronary artery is widely patent and bifurcates into a left    anterior descending artery and left circumflex artery. 2. The left anterior descending artery is widely patent throughout its course    and gives rise to one large diagonal branch which is widely patent. 3. The left circumflex is widely patent throughout its course. 4. The right coronary artery is widely patent throughout its course and    bifurcates distally into a posterior descending artery and posterolateral    artery, both of which are widely patent. 5. Left ventriculography performed in the 30 degree RAO view using a total of    24 cc of contrast at 12 cc/sec showed normal LV function.  Left ventricular    pressure was 125/18 mmHg.  Aortic pressure was 129/68 mmHg.  Of note, O2  saturations were 97% on room air.  ASSESSMENT: 1. Noncardiac chest pain. 2. Normal coronary arteries. 3. Normal left ventricular function. 4. Diabetes mellitus.  PLAN: 1. Discharge to home later today after bed rest. 2. 2-D echocardiogram to rule out pericardial effusion as etiology of    chest pain. 3. No Glucovance x 48 hours; restart on Friday. 4. Protonix 40 mg a day for GI prophylaxis. 5. Follow up with  Dr. Jaci Standard in two weeks. 6. Follow up with Dr. Alroy Dust in two weeks. Dictated by:   Fransico Him, M.D. Attending Physician:  Rutherford Limerick DD:  04/03/01 TD:  04/03/01 Job: [id number]

## 2010-08-27 NOTE — Discharge Summary (Signed)
NAMEDAVIAN, GARCIARODRIGUE             ACCOUNT NO.:  [id number]   MEDICAL RECORD NO.:  [id number]          PATIENT TYPE:  INP   LOCATION:  3729                         FACILITY:  [facility]   PHYSICIAN:  Melissa S. Inda Castle, NPDATE OF BIRTH:  19-May-1950   DATE OF ADMISSION:  07/03/2005  DATE OF DISCHARGE:  07/05/2005                                 DISCHARGE SUMMARY   DISCHARGE DIAGNOSES:  1.  Atypical chest pain accompanied by dyspnea on exertion.  2.  Hypothyroidism, status post thyroidectomy with depressed TSH.  3.  Uncontrolled diabetes.  4.  Gastroesophageal reflux disease.   HISTORY OF PRESENT ILLNESS:  The patient is a 60 year old African American  female who presented with complaints of chest pain as well as worsening  dyspnea on exertion which started approximately 2 weeks.  The patient was  admitted for further evaluation.   PAST MEDICAL HISTORY:  1.  History of chest pain, 2002, with a negative cardiac catheterization.  2.  Diabetes, type 2, diagnosed 10 years ago.  3.  History of breast cancer followed by Dr. Humphrey Rolls.  4.  Hypothyroidism, status post thyroidectomy in 1975.  5.  History of right knee and shoulder surgeries.  6.  GERD.  7.  History of gastritis.  8.  History of hysterectomy and bilateral salpingo-oophorectomy in 1972.  9.  History of anterior cervical diskectomy.   COURSE OF HOSPITALIZATION:  1.  Atypical chest pain.  The patient was admitted and underwent serial      cardiac enzymes which were negative.  A D-dimer was performed which was      mildly elevated with a value of 0.82.  As a result, the CT chest was      performed which was negative for PE but did notice a subtle, hazy      density bilaterally.  Radiology questioned possible vascular cause such      as pulmonary artery hypertension, small airway disease, or infectious      etiology.  As the patient has been treated with multiple chemotherapy      agents and also describes increasing dyspnea on  exertion accompanied by      2-pillow orthopnea, we will check a 2D echocardiogram prior to discharge      to home today.   1.  Hypothyroidism.  The patient is status post thyroidectomy and is      maintained on thyroid supplements.  Her TSH this admission was low with      value of 0.033.  Her outpatient dosing of levothyroxine was 150 mcg.      This will be decreased to 125 mcg and will need to be repeated in 1      month's time.   1.  Diabetes, type 2.  The patient was noted to have elevated CBGs and a      hemoglobin A1c of 10.7.  Her metformin has been held temporarily      secondary to IV contrast dye; however, she will likely need the addition      of Lantus insulin after discharge.  Will defer to patient's primary  care.   DISCHARGE MEDICATIONS:  1.  Metformin.  The patient was instructed to hold for 48 hours.  2.  Actos 15 mg p.o. daily.  3.  Glipizide ER 10 mg p.o. daily.  4.  Levothyroxine 0.125 mg p.o. daily.  5.  Protonix 40 mg p.o. daily.   PERTINENT LABORATORY DATA AT DISCHARGE:  Cardiac enzymes negative x4.  TSH  0.033.  Hemoglobin A1c 10.7.   FOLLOWUP:  The patient was instructed to follow up with Dr. Loura Pardon on  Monday, July 11, 2005, at 11 a.m.  At this visit, she will need reevaluation  of her glycemic control and possible addition of Lantus insulin.  She will  also need a followup TSH in 1 month's time.  Plan to discharge the patient  home this afternoon if 2D echocardiogram stable.      Melissa S. Inda Castle, NP     MSO/MEDQ  D:  07/04/2005  T:  07/05/2005  Job:  [id number]   cc:   Marne A. Tower, M.D. [facility]  [address] [place]  [place] [address]

## 2010-08-27 NOTE — Assessment & Plan Note (Signed)
[facility] OFFICE NOTE   [name]                    MRN:          [id number]  DATE:02/16/2006                            DOB:          12-Nov-1950    [name] was seen today in followup.  [name] had a heart cath but did not  have coronary disease.  [name] continues to have chest pain that [name] takes  nitro for.  I explained to [name] that since [name] does not have blocked  arteries, [name] should not be taking it.  [name] EF was 40-45%.  We will recheck  it by echo.  [name] may have a nonischemic cardiomyopathy.  [name] is on Hyzaar  50/12.5 for after-load reduction, and this is also helping with proteinuria  for [name] diabetes.  [name] blood sugars have been up and down.   The patient is due to have a repeat CT scan and PET scan in January in  regards to [name] previous cancer.   I told [name] [name] should follow up with Dr. Glori Bickers in regards to other  noncardiac etiologies.  [name] Port-A-Cath has been removed, and I have  reviewed [name] chart.  [name] had a CT scan done in March, 2007 which showed no  evidence of PE but subtle patchy haziness in both lower lobes.  I told [name]  [name] should have a follow-up PET scan as well as CT scan to make sure there  was not any other noncardiac etiology to [name] chest pain.   From our perspective, [name] will have a follow-up to reassess [name] LV function.  [name] medications include Metformin 850 t.i.d., glipizide 10 b.i.d., Lantus at  night, Actos 45 mg a day, Protonix 40 a day, Hyzaar 50/12.5, Synthroid 100  mcg a day, and Lyrica.   PHYSICAL EXAMINATION:  GENERAL:  [name] is overweight.  SKIN:  Warm and dry.  HEENT:  Normal.  NECK:  There is no cervical lymphadenopathy.  BREASTS:  [name] is status post right breast mastectomy with Port-A-Cath on the  left side that has been removed.  LUNGS:  Clear.  There is an S1 and S2 with normal heart sounds.  ABDOMEN:  Obese.  EXTREMITIES:  Pulses are intact with no  edema.  NEURO:  Nonfocal.   IMPRESSION:  Probable noncardiac chest pain, coronary angiography not  showing any significant obstruction.  Continue chest pain.  Follow up CT and  PET scans in January, probable nonischemic cardiomyopathy.  Follow up EF by  echo.  I will see the patient back in three months.  [name] will continue [name]  ACE inhibitor.   ______________________________  Wallis Bamberg. Johnsie Cancel, MD, [facility]   PCN/MedQ  DD: 02/16/2006  DT: 02/16/2006  Job #: [id number]   cc:   Marne A. Glori Bickers, MD

## 2010-08-27 NOTE — Op Note (Signed)
[name], [name]             ACCOUNT NO.:  [id number]   MEDICAL RECORD NO.:  [id number]          PATIENT TYPE:  AMB   LOCATION:  DAY                          FACILITY:  [facility]   PHYSICIAN:  Shellia Carwin, M.D. DATE OF BIRTH:  21-Dec-1950   DATE OF PROCEDURE:  03/29/2004  DATE OF DISCHARGE:                                 OPERATIVE REPORT   OPERATIVE PROCEDURE:  Insertion of Export venous access device, left  subclavian.   SURGEON:  Shellia Carwin, M.D.   ANESTHESIA:  MAC, local 1% Xylocaine, IV sedation.   PREOPERATIVE DIAGNOSIS:  Breast cancer, needs chemotherapy and venous access  for that.   POSTOPERATIVE DIAGNOSIS:  Breast cancer, needs chemotherapy and venous  access for that.   CLINICAL SUMMARY:  This 60 year old lady has recently undergone a mastectomy  for breast cancer and requires chemotherapy.   OPERATIVE FINDINGS:  The left subclavian was punctured on the first attempt,  but I could not get that J-wire to lead in; therefore, I had to remove all  that and do a second puncture, which required several attempts, and the last  one obviously successful.  No evidence of any complication.  There was good  blood flow at the end of the procedure, and x-rays showed it to be in good  position.   OPERATIVE PROCEDURE:  Under satisfactory intravenous sedation, the patient  was positioned, prepped and draped in a standard fashion.  She received 1 gm  Ancef preop.  Of note is the fact that her right breast wound is totally  healed.   After proper positioning, 1% Xylocaine was infiltrated, and the left  subclavian vein punctured on this first attempt.  A subcutaneous pocket was  then made, using the old incision from her previous PAC.  I made a tunnel  between the puncture site, which was extended in the pocket and brought the  catheter through, cut at the appropriate length, and connected it to the  venous reservoir, which was flushed with heparin.   Then the dilator  was placed over the vein without difficulty, and after it  was removed and the introducer placed over it and both over the vein, it was  obvious that something was wrong.  I think that the J-wire just backed out;  therefore, I removed that set and got a second introducer and J-wire.  I  withdrew the catheter from the tunnel so I would not injure it with the  puncher.  I then puncture the left subclavian vein successfully after  several attempts.  The new introducer and J-wire were placed without problem  and confirmed to be in the correct position and the length of the catheter  also judged appropriate.  Then the dilator and introducer were placed over  the J-wire.  The J-wire and dilator withdrawn.  The catheter placed down  into the introducer, which was then peeled away, leaving it in good  position.  This was confirmed by fluoroscopy and aspiration.  The reservoir  was then flushed with concentrated  heparin and anchored in the deep subcu with a 0 Prolene suture.  The wounds  were then closed in layers with 3-0 and 4-0 Vicryl followed by Steri-Strips.  The sterile absorbent dressing was applied, and the patient went to the  recovery room from the operating room in good condition.      MRL/MEDQ  D:  03/29/2004  T:  03/29/2004  Job:  [id number]   cc:   Marcy Panning, MD  Fax: [phone]

## 2010-08-27 NOTE — Cardiovascular Report (Signed)
[name], [name] NO.:  [id number]   MEDICAL RECORD NO.:  [id number]          PATIENT TYPE:  OIB   LOCATION:  1965                         FACILITY:  [facility]   PHYSICIAN:  Jenkins Rouge, M.D.     DATE OF BIRTH:  [birth year]   DATE OF PROCEDURE:  08/10/2005  DATE OF DISCHARGE:  08/10/2005                              CARDIAC CATHETERIZATION   PROCEDURE:  Right heart right and left heart catheterization.   INDICATIONS:  60 year old patient with chest pain and shortness of breath,  abnormal Myoview with apical defect and decreased LV function. Note should  be made that the patient has a history of breast cancer with a Port-A-Cath  in.   Catheterization was done from the right femoral artery and vein.   Right heart catheterization was done due to the patient's dyspnea and  decreased LV function.  Mean right atrial pressure was 11, RV pressure was  36/10, PA pressure was 36 over 15.  Mean pulmonary capillary wedge pressure  was 16.  Aortic pressures 122/64, LV pressure is 124/70.   In short, the patient had reasonable filling pressures with no evidence of  pulmonary hypertension.   CORONARY ARTERIOGRAPHY:   Left main coronary is normal.   Left anterior descending artery is normal.   Circumflex coronary artery is normal.   Right coronary was dominant and normal.   RAO ventriculography showed global hypokinesis with a mild to moderate left  ventricular cavity and ejection fraction was in the 40% range.   There is mild mitral regurgitation.   IMPRESSION:  The patient appears to have a nonischemic cardiomyopathy.  This  is most likely related to her chemotherapy for breast cancer.   Apparently this is still being worked up.  In regards to any further therapy  if she does need further therapy of this will have to be factored into it.  She would not be a candidate for her septum.   It would be nice to get her Port-A-Cath out of her breast cancer does not  require any further adjuvant therapy.   We will see the patient back in the office in follow-up.  I suspect that  given the fact that she is diabetic she would probably benefit from starting  an ARB with possibly low dose hydrochlorothiazide.   Since she did just get contrast and is on metformin, we will wait until  adding this when I see her back in the office.   Aside from treating her with an ARB and a diuretic, I do not think further  therapy is warranted and she will have follow-up MRI or MUGA scan to assess  LV function in 6 months.           ______________________________  Jenkins Rouge, M.D.     PN/MEDQ  D:  08/10/2005  T:  08/10/2005  Job:  [id number]   cc:   Shellia Carwin, M.D.  [address], [address]  [place]  [place] [address]   Marne A. Tower, M.D. [facility]  [address] [place]  [place] [address]

## 2010-10-14 ENCOUNTER — Other Ambulatory Visit (INDEPENDENT_AMBULATORY_CARE_PROVIDER_SITE_OTHER): Payer: Medicare HMO | Admitting: Family Medicine

## 2010-10-14 DIAGNOSIS — I10 Essential (primary) hypertension: Secondary | ICD-10-CM

## 2010-10-14 DIAGNOSIS — E039 Hypothyroidism, unspecified: Secondary | ICD-10-CM

## 2010-10-14 DIAGNOSIS — E119 Type 2 diabetes mellitus without complications: Secondary | ICD-10-CM

## 2010-10-14 LAB — LIPID PANEL
HDL: 46.3 mg/dL (ref >39.00)
LDL Cholesterol: 81 mg/dL (ref 0–99)
Total CHOL/HDL Ratio: 4
VLDL: 38.2 mg/dL (ref 0.0–40.0)

## 2010-10-14 LAB — COMPREHENSIVE METABOLIC PANEL
CO2: 24 mEq/L (ref 19–32)
Creatinine, Ser: 0.7 mg/dL (ref 0.4–1.2)
GFR: 114.68 mL/min (ref >60.00)
Glucose, Bld: 312 mg/dL — ABNORMAL HIGH (ref 70–99)
Sodium: 135 mEq/L (ref 135–145)
Total Bilirubin: 0.5 mg/dL (ref 0.3–1.2)
Total Protein: 7.4 g/dL (ref 6.0–8.3)

## 2010-10-14 LAB — HEMOGLOBIN A1C: Hgb A1c MFr Bld: 13.5 % — ABNORMAL HIGH (ref 4.6–6.5)

## 2010-10-14 LAB — CBC WITH DIFFERENTIAL/PLATELET
Eosinophils Relative: 2.8 % (ref 0.0–5.0)
HCT: 40.5 % (ref 36.0–46.0)
Hemoglobin: 13.9 g/dL (ref 12.0–15.0)
Lymphs Abs: 2.8 10*3/uL (ref 0.7–4.0)
Monocytes Relative: 7.8 % (ref 3.0–12.0)
Neutro Abs: 3.4 10*3/uL (ref 1.4–7.7)
WBC: 7.1 10*3/uL (ref 4.5–10.5)

## 2010-10-14 LAB — TSH: TSH: 1.91 u[IU]/mL (ref 0.35–5.50)

## 2010-10-19 ENCOUNTER — Ambulatory Visit (INDEPENDENT_AMBULATORY_CARE_PROVIDER_SITE_OTHER): Payer: Medicare HMO | Admitting: Family Medicine

## 2010-10-19 ENCOUNTER — Encounter: Payer: Self-pay | Admitting: Family Medicine

## 2010-10-19 DIAGNOSIS — E119 Type 2 diabetes mellitus without complications: Secondary | ICD-10-CM

## 2010-10-19 MED ORDER — INSULIN GLARGINE 100 UNIT/ML ~~LOC~~ SOLN: 100.0000 [IU] | Freq: Every day | SUBCUTANEOUS | Status: DC

## 2010-10-19 NOTE — Patient Instructions (Signed)
Make sure to get your eye doctor appointment  Increase your lantus to 80 units for 1 week/ then 90 units for 1 week and then to 100 units daily  If sugars get too low - back down and let me know Get in line with diabetic diet  Start working towards exercise 5 days per week Send me a list of blood sugars in about 3 weeks  Follow up with me in 3 months

## 2010-10-19 NOTE — Assessment & Plan Note (Signed)
Very much out of control and pt is finally ready and motivated to work on this  Will inc lantus in 10 unit intervals from 69- to 100 units as tolerated with frequent sugar checks Pt will make her own appt with opthy  Disc DM diet - willing to get back to that Very motivated to exercise Will send sugar log in 3 wk  Then will f/u 3 mo

## 2010-10-19 NOTE — Progress Notes (Signed)
Subjective:    Patient ID: [name], female    DOB: October 25, 1950, 60 y.o.   MRN: [id number]  HPI Here for f/u of diabetes  Overall is feeling ok   Stomach problems on and off -- with terrible stomachache and then some diarrhea  Eases up and goes away for at least 2 weeks  No particular triggers  If ? Dairy Really likes ice cream  Loves cheese too   A1c is 13.5  Feet and hands are numb and tingly  Sees foot doctor   Is not drinking coke as much -- may have 2 cokes per week , lots more water  Eating baked instead of fried chicken , avoiding all fried foods  No ice cream in 2 weeks - has it in her freezer -- used to eat 2 scoops twice a week Almost no sweets -- makes them and gives them away  Has done DM teaching with good knowledge base    Checking sugars at home - always over 200  Hard to check because of her fingers (discomfort )  Has a lot of reunions coming up   Also not getting any exercise  Plans to go ahead and start walking  [facility]   Vision is suprisingly not blurry  Has not been for eye exam for a while  Sees Dr Einar Gip    Patient Active Problem List  Diagnoses  . HYPOTHYROIDISM  . DIABETES MELLITUS, TYPE II  . SYNCOPE-CAROTID SINUS  . PERIPHERAL NEUROPATHY  . HYPERTENSION  . CARDIOMYOPATHY  . GERD  . FATTY LIVER DISEASE  . OSTEOARTHRITIS  . DISC DISEASE, CERVICAL  . DISC DISEASE, LUMBAR  . BACK PAIN  . Headache  . ABDOMINAL PAIN, LOWER  . BREAST CANCER, HX OF  . HYPERPARATHYROIDISM, HX OF  . RENAL CALCULUS, HX OF   Past Medical History  Diagnosis Date  . GERD (gastroesophageal reflux disease)   . Hypertension   . Breast cancer   . Diabetes mellitus     Type II  . Headache   . Hypothyroidism   . Osteoarthritis   . Peripheral neuropathy   . Cardiomyopathy   . Chest pain 06/2005    Hospitalized, dystolic dysfunction   Past Surgical History  Procedure Date  . Abdominal hysterectomy 1993  . Total knee  arthroplasty 2000    Right  . Mastectomy 12/2003  . Thyroidectomy 1977    Right  . Neck surgery 2001  . Shoulder surgery 2003    Left  . Lymph node removal 2003    Squamous cell CA  . Cardiac catheterization ? 2005 / 2007  . Adenosin cardiolite 07/2005    (+) wall motion abnormality  . Esophagogastroduodenoscopy 2005    GERD  . [place] of abdomen 07/2005    Fatty liver / kidney stones  . Ct abdomen and pelvis 02/2010    Same   History  Substance Use Topics  . Smoking status: Never Smoker   . Smokeless tobacco: Not on file  . Alcohol Use: No   Family History  Problem Relation Age of Onset  . Hypertension Mother   . Diabetes Father   . Hypertension Father   . Hypertension Sister   . Diabetes Other     Family Hx. of Diabetes  . Hypertension Other     Family History of HTN  . Hypertension Brother   . Hypertension Brother   . Diabetes Brother    Allergies  Allergen Reactions  . Ampicillin     REACTION: Rash and whelps on her hands when she took it in the hospital  . Latex     REACTION: Patient had a reaction to tape after surgery and they told her she was allergic to Latex   Current Outpatient Prescriptions on File Prior to Visit  Medication Sig Dispense Refill  . acetaminophen (TYLENOL) 500 MG tablet Take 2 tablets at night as needed for pain.       . cyclobenzaprine (FLEXERIL) 10 MG tablet Take 1 tablet (10 mg total) by mouth 2 (two) times daily as needed (headache).  40 tablet  0  . glucose blood (ONE TOUCH TEST STRIPS) test strip Check blood sugar two times a day and as needed for out of control DM2.    250.00       . levothyroxine (SYNTHROID, LEVOTHROID) 100 MCG tablet Take 100 mcg by mouth daily.        [name] Kitchen losartan-hydrochlorothiazide (HYZAAR) 50-12.5 MG per tablet Take 1 tablet by mouth daily.        . metFORMIN (GLUCOPHAGE) 850 MG tablet Take 850 mg by mouth 2 (two) times daily with a meal.        . NEEDLE, DISP, 30 G (BD DISP NEEDLES) 30G X 1/2" MISC For Lantus  Insulin.  To inject once daily as needed.       . fluticasone (FLONASE) 50 MCG/ACT nasal spray 2 sprays by Nasal route daily.        [name] Kitchen ibuprofen (ADVIL,MOTRIN) 200 MG tablet Take 200 mg by mouth every 6 (six) hours as needed.        . nitroGLYCERIN (NITROSTAT) 0.4 MG SL tablet Place 0.4 mg under the tongue every 5 (five) minutes as needed.        . ranitidine (ZANTAC) 75 MG tablet Take 75 mg by mouth daily as needed.               Review of Systems Review of Systems  Constitutional: Negative for fever, appetite change, fatigue and unexpected weight change.  Eyes: Negative for pain and visual disturbance.  Respiratory: Negative for cough and shortness of breath.   Cardiovascular: Negative.for cp or sob    Gastrointestinal: pos for occ cramps and diarrhea / no vomiting  Genitourinary: Negative for urgency and frequency.  Skin: Negative for pallor.  Neurological: Negative for weakness, light-headedness,and headaches. baseline pos for numbness and tingling  Hematological: Negative for adenopathy. Does not bruise/bleed easily.  Psychiatric/Behavioral: Negative for dysphoric mood. The patient is not nervous/anxious.         Objective:   Physical Exam  Constitutional: She appears well-developed and well-nourished. No distress.       overwt and well appearing   HENT:  Head: Normocephalic and atraumatic.  Mouth/Throat: Oropharynx is clear and moist.  Eyes: Conjunctivae and EOM are normal. Pupils are equal, round, and reactive to light.  Neck: Neck supple. No JVD present. Carotid bruit is not present. No thyromegaly present.  Cardiovascular: Normal rate, regular rhythm, normal heart sounds and intact distal pulses.   Pulmonary/Chest: Effort normal and breath sounds normal. No respiratory distress. She has no wheezes.  Abdominal: Soft. Bowel sounds are normal. She exhibits no distension, no abdominal bruit and no mass. There is no tenderness.  Musculoskeletal: Normal range of motion. She  exhibits no edema and no tenderness.  Lymphadenopathy:    She has no cervical adenopathy.  Neurological: She is alert. She has normal reflexes. A sensory  deficit is present. Coordination normal.       Feet dec sens to lt touch  Skin: Skin is warm and dry. No rash noted. No erythema. No pallor.  Psychiatric: She has a normal mood and affect.          Assessment & Plan:

## 2010-10-28 IMAGING — CR DG CHEST 1V PORT
1 series · 1 of 1 positions shown · non-contrast
Comparison: 04/30/2007

CLINICAL DATA: Chest pain

PORTABLE CHEST - 1 VIEW

[AP]
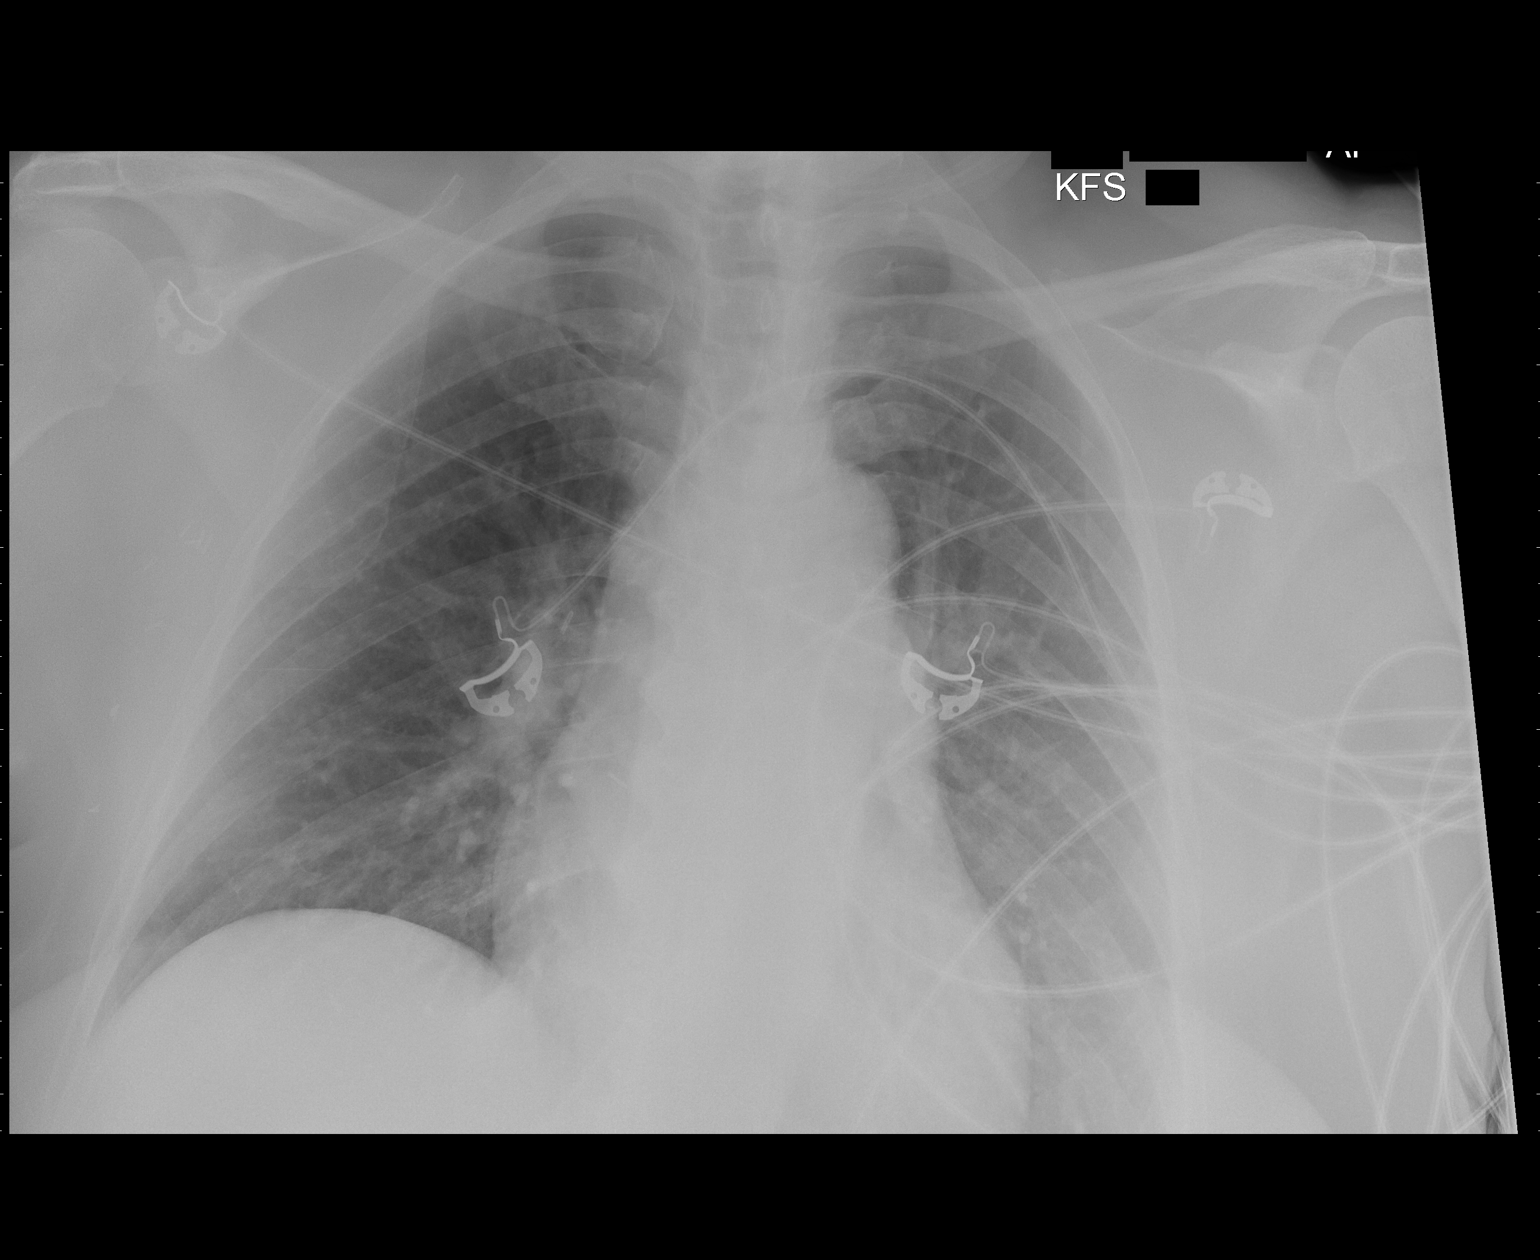

[1 of 1 positions shown; findings below may reference images not displayed]

FINDINGS: The heart size and mediastinal contours are within normal
limits.  Both lungs are clear.  The visualized skeletal structures
are unremarkable.
IMPRESSION: No acute cardiopulmonary abnormalities.

## 2010-11-30 ENCOUNTER — Other Ambulatory Visit: Payer: Self-pay | Admitting: Gastroenterology

## 2010-11-30 DIAGNOSIS — R1031 Right lower quadrant pain: Secondary | ICD-10-CM

## 2010-12-03 ENCOUNTER — Ambulatory Visit
Admission: RE | Admit: 2010-12-03 | Discharge: 2010-12-03 | Disposition: A | Discharge status: Home / Self Care | Payer: Medicare HMO | Source: Ambulatory Visit | Attending: Gastroenterology | Admitting: Gastroenterology

## 2010-12-03 DIAGNOSIS — R1031 Right lower quadrant pain: Secondary | ICD-10-CM

## 2010-12-03 MED ORDER — IOHEXOL 300 MG/ML  SOLN: 125.0000 mL | Freq: Once | INTRAMUSCULAR | Status: AC | PRN

## 2010-12-07 ENCOUNTER — Encounter: Payer: Self-pay | Admitting: Family Medicine

## 2010-12-07 ENCOUNTER — Ambulatory Visit (INDEPENDENT_AMBULATORY_CARE_PROVIDER_SITE_OTHER): Payer: No Typology Code available for payment source | Admitting: Family Medicine

## 2010-12-07 DIAGNOSIS — M25511 Pain in right shoulder: Secondary | ICD-10-CM | POA: Insufficient documentation

## 2010-12-07 DIAGNOSIS — R52 Pain, unspecified: Secondary | ICD-10-CM

## 2010-12-07 DIAGNOSIS — M25519 Pain in unspecified shoulder: Secondary | ICD-10-CM

## 2010-12-07 NOTE — Assessment & Plan Note (Addendum)
Diffuse body aches worse in shoulders, back and hips. ? Component of PMR given age and some stiffness in am, however not completely consistent with this. Check ESR. No vision changes, no temporal pain. Also discussed importance of blood sugar control.

## 2010-12-07 NOTE — Progress Notes (Signed)
  Subjective:    Patient ID: [name], female    DOB: Nov 27, 1950, 60 y.o.   MRN: [id number]  HPI CC: body aches  4d h/o feeling generalized achiness.  Sunday worse pain, stats in neck and bilateral shoulders R>L, travels down to knees.  H/o arthritis in past, previously mostly in back.  Tried tylenol (takes 3 at a time, helped for 3 hours, then returns) and flexeril (helped some, then returns).  advil doesn't seem to help as much.  Resting and staying off feet improves ain.  Arm movement (ie driving or holding arm up) makes pain in shoulder worse.  H/o mastectomy on right side.  Pain worse in am, feels stiff for first hour.  Denies arthritis (swelling, redness, warmth).  No fevers/chills.  No HA, vision changes, temporal pain.  No abd pain, nausea/vomiting.  Denies limb girdle weakness but endorses some weakness right arm 2/2 pain.  No coughing, congestion, shortness of breath.  Lab Results  Component Value Date   CREATININE 0.7 10/14/2010   Lab Results  Component Value Date   HGBA1C 13.5* 10/14/2010  states sugars have been running ok - 180s after meals.  Recently had CT scan by Dr. Oletta Lamas last week for several day h/o right abd pain, comes and goes  H/o rotator cuff surgery on left shoulder.  Review of Systems Per HPI    Objective:   Physical Exam  Nursing note and vitals reviewed. Constitutional: She appears well-developed and well-nourished. No distress.  HENT:  Head: Normocephalic and atraumatic.  Musculoskeletal:       Tender cervical paraspinous mm, no pain midline spine. Neg spurling test. Limited ROM 2/2 pain R>L at shoulder.  Ok passive ROM. Tender throughout right shoulder joint to palpation. Pain with testing of RTC against resistance (int/ext rotation at shoulder as well as empty can test bilaterally, but R>L) Mildly tender with rotation of humeral head in [facility] joint on R. No pain with int/ext rotation at hip.  No pain with full knee flexion/extension. No  edema, erythema or warmth of any joints.  Neurological: She is alert. She has normal strength. No sensory deficit.  Reflex Scores:      Bicep reflexes are 2+ on the right side and 2+ on the left side.      Grip strength intact bilaterally  Skin: Skin is warm and dry. No rash noted.  Psychiatric: She has a normal mood and affect.          Assessment & Plan:

## 2010-12-07 NOTE — Assessment & Plan Note (Signed)
Anticipate RTC tendinopathy, no tear.  Doubt bursitis. Treat with scheduled ibuprofen 400-600mg  2-3times daily for next 5 days, tylenol as needed as well. Update [place] if not improving. Provided with RTC injury handout from [facility] pt advisor.

## 2010-12-07 NOTE — Patient Instructions (Addendum)
Blood work today to rule out other causes of pain. Could be right shoulder rotator cuff tendinitis. Treat with flexeril continued, ibuprofne (advil) 400-600mg  2-3 times daily for next 5-7 days, ice/heat to shoulder, and may use tylenol but only 2 at a time. I also want to ensure your sugar levels get better controlled as too high sugars can make you feel achey as well. stretching exercises for right shoulder provided today.

## 2010-12-15 ENCOUNTER — Other Ambulatory Visit: Payer: Self-pay | Admitting: Gastroenterology

## 2010-12-15 DIAGNOSIS — R1031 Right lower quadrant pain: Secondary | ICD-10-CM

## 2010-12-20 ENCOUNTER — Ambulatory Visit
Admission: RE | Admit: 2010-12-20 | Discharge: 2010-12-20 | Disposition: A | Discharge status: Home / Self Care | Payer: Medicare HMO | Source: Ambulatory Visit | Attending: Gastroenterology | Admitting: Gastroenterology

## 2010-12-20 DIAGNOSIS — R1031 Right lower quadrant pain: Secondary | ICD-10-CM

## 2010-12-27 ENCOUNTER — Encounter: Payer: Self-pay | Admitting: Family Medicine

## 2011-01-14 ENCOUNTER — Other Ambulatory Visit: Payer: Self-pay | Admitting: Family Medicine

## 2011-01-21 ENCOUNTER — Other Ambulatory Visit: Payer: Self-pay | Admitting: *Deleted

## 2011-01-21 MED ORDER — LEVOTHYROXINE SODIUM 100 MCG PO TABS: 100.0000 ug | ORAL_TABLET | Freq: Every day | ORAL | Status: DC

## 2011-01-24 LAB — I-STAT 8, (EC8 V) (CONVERTED LAB)
Acid-Base Excess: 2
Chloride: 105
Glucose, Bld: 220 — ABNORMAL HIGH
Hemoglobin: 17 — ABNORMAL HIGH
Potassium: 4.4
Sodium: 138
pH, Ven: 7.405 — ABNORMAL HIGH

## 2011-01-24 LAB — POCT I-STAT CREATININE: Operator id: 239701

## 2011-01-28 ENCOUNTER — Other Ambulatory Visit: Payer: Self-pay | Admitting: Family Medicine

## 2011-02-01 NOTE — Telephone Encounter (Signed)
Left message for pt to call back. Pt seen 10/19/10  And Lantus increased to 100 units [place] at hs. Left v/m for pt to call back and tell how much Lantus she is taking.

## 2011-02-02 NOTE — Telephone Encounter (Signed)
Left v/m on pts home and cell to call back.

## 2011-02-02 NOTE — Telephone Encounter (Signed)
Spoke with pt and she confirmed she has been taking 90 units of Lantus at hs. Pt said she feels OK but her fasting blood sugars are in the 180s. Pt does not take her blood sugar every day because it hurts too much. Pt said she is trying to drink diet Dr Malachi Bonds instead of all the Cokes she used to drink and is trying to eat more baked chicken than fried. Pt said she will do blood sugar fasting and 2 hr after a meal for 1 week and will send that log in by mail after she completes it. Pt wants Dr Marliss Coots input. Pt can be reached at [phone].

## 2011-02-02 NOTE — Telephone Encounter (Signed)
I do want her to increase her lantus to 100 units once per day  Check sugar bid for 1 week and send me the report as asked -- also if a very high sugar write in what she had most recently eaten- thanks - then I will adv further

## 2011-02-04 NOTE — Telephone Encounter (Signed)
Patient notified as instructed by telephone. 

## 2011-02-19 ENCOUNTER — Encounter (HOSPITAL_COMMUNITY): Payer: Self-pay | Admitting: *Deleted

## 2011-02-19 ENCOUNTER — Emergency Department (HOSPITAL_COMMUNITY)
Admission: EM | Admit: 2011-02-19 | Discharge: 2011-02-19 | Disposition: A | Discharge status: Home / Self Care | Payer: Medicare HMO | Attending: Emergency Medicine | Admitting: Emergency Medicine

## 2011-02-19 ENCOUNTER — Emergency Department (HOSPITAL_COMMUNITY): Payer: Medicare HMO

## 2011-02-19 DIAGNOSIS — M79609 Pain in unspecified limb: Secondary | ICD-10-CM | POA: Insufficient documentation

## 2011-02-19 DIAGNOSIS — M25559 Pain in unspecified hip: Secondary | ICD-10-CM | POA: Insufficient documentation

## 2011-02-19 DIAGNOSIS — E039 Hypothyroidism, unspecified: Secondary | ICD-10-CM | POA: Insufficient documentation

## 2011-02-19 DIAGNOSIS — K219 Gastro-esophageal reflux disease without esophagitis: Secondary | ICD-10-CM | POA: Insufficient documentation

## 2011-02-19 DIAGNOSIS — M542 Cervicalgia: Secondary | ICD-10-CM | POA: Insufficient documentation

## 2011-02-19 DIAGNOSIS — E119 Type 2 diabetes mellitus without complications: Secondary | ICD-10-CM | POA: Insufficient documentation

## 2011-02-19 DIAGNOSIS — M5430 Sciatica, unspecified side: Secondary | ICD-10-CM

## 2011-02-19 DIAGNOSIS — I10 Essential (primary) hypertension: Secondary | ICD-10-CM | POA: Insufficient documentation

## 2011-02-19 LAB — GLUCOSE, CAPILLARY: Glucose-Capillary: 256 mg/dL — ABNORMAL HIGH (ref 70–99)

## 2011-02-19 MED ORDER — ONDANSETRON 4 MG PO TBDP
4.0000 mg | ORAL_TABLET | Freq: Once | ORAL | Status: AC
  Administered 2011-02-19: 4 mg via ORAL
  Filled 2011-02-19: qty 1

## 2011-02-19 MED ORDER — METHYLPREDNISOLONE SODIUM SUCC 125 MG IJ SOLR
125.0000 mg | Freq: Once | INTRAMUSCULAR | Status: AC
  Administered 2011-02-19: 125 mg via INTRAMUSCULAR
  Filled 2011-02-19: qty 2

## 2011-02-19 MED ORDER — HYDROMORPHONE HCL PF 1 MG/ML IJ SOLN
1.0000 mg | Freq: Once | INTRAMUSCULAR | Status: AC
  Administered 2011-02-19: 1 mg via INTRAMUSCULAR
  Filled 2011-02-19: qty 1

## 2011-02-19 MED ORDER — OXYCODONE-ACETAMINOPHEN 5-325 MG PO TABS: 1.0000 | ORAL_TABLET | ORAL | Status: DC | PRN

## 2011-02-19 NOTE — ED Notes (Signed)
Pt is here with right hip and leg pain that feels like a tooth ache.  No injury.   Pt has a disc problem in her back,  Back doesnot hurt

## 2011-02-19 NOTE — ED Provider Notes (Signed)
History     CSN: [id number] Arrival date & time: 02/19/2011 12:12 PM   First MD Initiated Contact with Patient 02/19/11 1542      Chief Complaint  Patient presents with  . Leg Pain    (Consider location/radiation/quality/duration/timing/severity/associated sxs/prior treatment) Patient is a 60 y.o. female presenting with hip pain. History provided by: She complains of right hip pain and pain running down her right leg she also states that she has had some pain in her neck she does have a history of cervical.  Hip Pain This is a new problem. The current episode started 2 days ago. The problem occurs constantly. The problem has not changed since onset.Pertinent negatives include no chest pain, no abdominal pain and no headaches. The symptoms are aggravated by twisting. The symptoms are relieved by nothing. She has tried nothing for the symptoms.    Past Medical History  Diagnosis Date  . GERD (gastroesophageal reflux disease)   . Hypertension   . Breast cancer   . Diabetes mellitus     Type II  . Headache   . Hypothyroidism   . Osteoarthritis   . Peripheral neuropathy   . Cardiomyopathy   . Chest pain 06/2005    Hospitalized, dystolic dysfunction    Past Surgical History  Procedure Date  . Abdominal hysterectomy 1993  . Total knee arthroplasty 2000    Right  . Mastectomy 12/2003  . Thyroidectomy 1977    Right  . Neck surgery 2001  . Shoulder surgery 2003    Left  . Lymph node removal 2003    Squamous cell CA  . Cardiac catheterization ? 2005 / 2007  . Adenosin cardiolite 07/2005    (+) wall motion abnormality  . Esophagogastroduodenoscopy 2005    GERD  . [place] of abdomen 07/2005    Fatty liver / kidney stones  . Ct abdomen and pelvis 02/2010    Same    Family History  Problem Relation Age of Onset  . Hypertension Mother   . Diabetes Father   . Hypertension Father   . Hypertension Sister   . Diabetes Other     Family Hx. of Diabetes  . Hypertension Other       Family History of HTN  . Hypertension Brother   . Hypertension Brother   . Diabetes Brother     History  Substance Use Topics  . Smoking status: Never Smoker   . Smokeless tobacco: Not on file  . Alcohol Use: No    OB History    Grav Para Term Preterm Abortions TAB SAB Ect Mult Living                  Review of Systems  Constitutional: Negative for fatigue.  HENT: Negative for congestion, sinus pressure and ear discharge.   Eyes: Negative for discharge.  Respiratory: Negative for cough.   Cardiovascular: Negative for chest pain.  Gastrointestinal: Negative for abdominal pain and diarrhea.  Genitourinary: Negative for frequency and hematuria.  Musculoskeletal: Positive for back pain.       Pt.. complains of lower back pain pain radiating down her right leg she also has some pain in her right side and  Skin: Negative for rash.  Neurological: Negative for seizures and headaches.  Hematological: Negative.   Psychiatric/Behavioral: Negative for hallucinations.    Allergies  Ampicillin and Latex  Home Medications   Current Outpatient Rx  Name Route Sig Dispense Refill  . FLUTICASONE PROPIONATE 50 MCG/ACT NA SUSP Nasal  2 sprays by Nasal route daily.      . IBUPROFEN 200 MG PO TABS Oral Take 200 mg by mouth every 6 (six) hours as needed.      . INSULIN GLARGINE 100 UNIT/ML [place] SOLN Subcutaneous Inject 50 Units into the skin daily.      . INSULIN GLARGINE 100 UNIT/ML [place] SOLN Subcutaneous Inject 50 Units into the skin at bedtime.      [name] Kitchen LEVOTHYROXINE SODIUM 100 MCG PO TABS Oral Take 1 tablet (100 mcg total) by mouth daily. 30 tablet 6  . LOSARTAN POTASSIUM-HCTZ 50-12.5 MG PO TABS Oral Take 1 tablet by mouth daily.      [name] Kitchen METFORMIN HCL 850 MG PO TABS Oral Take 850 mg by mouth 3 (three) times daily.      [name] Kitchen NITROGLYCERIN 0.4 MG SL SUBL Sublingual Place 0.4 mg under the tongue every 5 (five) minutes as needed.      [name] Kitchen RANITIDINE HCL 75 MG PO TABS Oral Take 75 mg by mouth daily  as needed.      . OXYCODONE-ACETAMINOPHEN 5-325 MG PO TABS Oral Take 1 tablet by mouth every 4 (four) hours as needed for pain. 30 tablet 0    BP 128/85  Pulse 60  Temp(Src) 98 F (36.7 C) (Oral)  Resp 20  SpO2 97%  Physical Exam  Constitutional: She is oriented to person, place, and time. She appears well-developed.  HENT:  Head: Normocephalic and atraumatic.  Eyes: Conjunctivae and EOM are normal. No scleral icterus.  Neck: Neck supple. No thyromegaly present.  Cardiovascular: Normal rate and regular rhythm.  Exam reveals no gallop and no friction rub.   No murmur heard. Pulmonary/Chest: No stridor. She has no wheezes. She has no rales. She exhibits no tenderness.  Abdominal: She exhibits no distension. There is no tenderness. There is no rebound.  Musculoskeletal: She exhibits no edema.       Patient has tenderness to lumbar spine with straight leg raising to right leg neurovascular exam is normal reflexes normal in her lower extremity patient also has mild tenderness to right lateral neck normal upper extremities and  Lymphadenopathy:    She has no cervical adenopathy.  Neurological: She is oriented to person, place, and time. Coordination normal.  Skin: No rash noted. No erythema.  Psychiatric: She has a normal mood and affect. Her behavior is normal.    ED Course  Procedures (including critical care time)  Labs Reviewed  GLUCOSE, CAPILLARY - Abnormal; Notable for the following:    Glucose-Capillary 256 (*)    All other components within normal limits  POCT CBG MONITORING   Dg Cervical Spine Complete  02/19/2011  *RADIOLOGY REPORT*  Clinical Data: Chronic neck pain  CERVICAL SPINE - COMPLETE 4+ VIEW  Comparison: None  Findings: No prevertebral soft tissue swelling.  There is anterior cervical fusion at C4-C5 which is stable.  There is anterior flowing osteophytosis present.  No subluxation.  .  Oblique projections demonstrate no significant neural foraminal narrowing.   Open mouth odontoid view is partially obscured by dental amalgam.  IMPRESSION:  1.  No acute findings in neck. Multilevel disc osteophytic disease.  2.  Stable anterior cervical fusion.  Original Report Authenticated By: Suzy Bouchard, M.D.   Dg Lumbar Spine Complete  02/19/2011  *RADIOLOGY REPORT*  Clinical Data: Neck and low back pain  LUMBAR SPINE - COMPLETE 4+ VIEW  Comparison: None.  Findings: Normal alignment of lumbar vertebral bodies.  There is mild endplate spurring at  L4.  No subluxation.  No pars fracture. Mild facet hypertrophy and L5-S1.  IMPRESSION: No acute findings lumbar spine.  Mild disc osteophytic disease and facet hypertrophy.  Original Report Authenticated By: Suzy Bouchard, M.D.     1. Sciatica     Results for orders placed during the hospital encounter of 02/19/11  GLUCOSE, CAPILLARY      Component Value Range   Glucose-Capillary 256 (*) 70 - 99 (mg/dL)   Comment 1 Documented in Chart     Dg Cervical Spine Complete  02/19/2011  *RADIOLOGY REPORT*  Clinical Data: Chronic neck pain  CERVICAL SPINE - COMPLETE 4+ VIEW  Comparison: None  Findings: No prevertebral soft tissue swelling.  There is anterior cervical fusion at C4-C5 which is stable.  There is anterior flowing osteophytosis present.  No subluxation.  .  Oblique projections demonstrate no significant neural foraminal narrowing.  Open mouth odontoid view is partially obscured by dental amalgam.  IMPRESSION:  1.  No acute findings in neck. Multilevel disc osteophytic disease.  2.  Stable anterior cervical fusion.  Original Report Authenticated By: Suzy Bouchard, M.D.   Dg Lumbar Spine Complete  02/19/2011  *RADIOLOGY REPORT*  Clinical Data: Neck and low back pain  LUMBAR SPINE - COMPLETE 4+ VIEW  Comparison: None.  Findings: Normal alignment of lumbar vertebral bodies.  There is mild endplate spurring at L4.  No subluxation.  No pars fracture. Mild facet hypertrophy and L5-S1.  IMPRESSION: No acute findings  lumbar spine.  Mild disc osteophytic disease and facet hypertrophy.  Original Report Authenticated By: Suzy Bouchard, M.D.      MDM  sciatica        Maudry Diego, MD 02/19/11 [phone]

## 2011-03-17 ENCOUNTER — Telehealth: Payer: Self-pay | Admitting: *Deleted

## 2011-03-17 NOTE — Telephone Encounter (Signed)
left voice message to inform the patient of  the new date and time on 06-16-2011 starting at 9:30am

## 2011-06-16 ENCOUNTER — Other Ambulatory Visit: Payer: Medicare HMO | Admitting: Lab

## 2011-06-16 ENCOUNTER — Ambulatory Visit: Payer: Medicare HMO | Admitting: Family

## 2011-06-21 ENCOUNTER — Other Ambulatory Visit: Payer: Medicare HMO | Admitting: Lab

## 2011-06-21 ENCOUNTER — Ambulatory Visit: Payer: Medicare HMO | Admitting: Family

## 2011-07-10 IMAGING — CR DG THORACIC SPINE 3V
3 series · 3 of 3 positions shown · non-contrast
Comparison: [DATE]

CLINICAL DATA: Back pain, more towards the right.

THORACIC SPINE - 2 VIEW + SWIMMERS

[t t-spine a.p.]
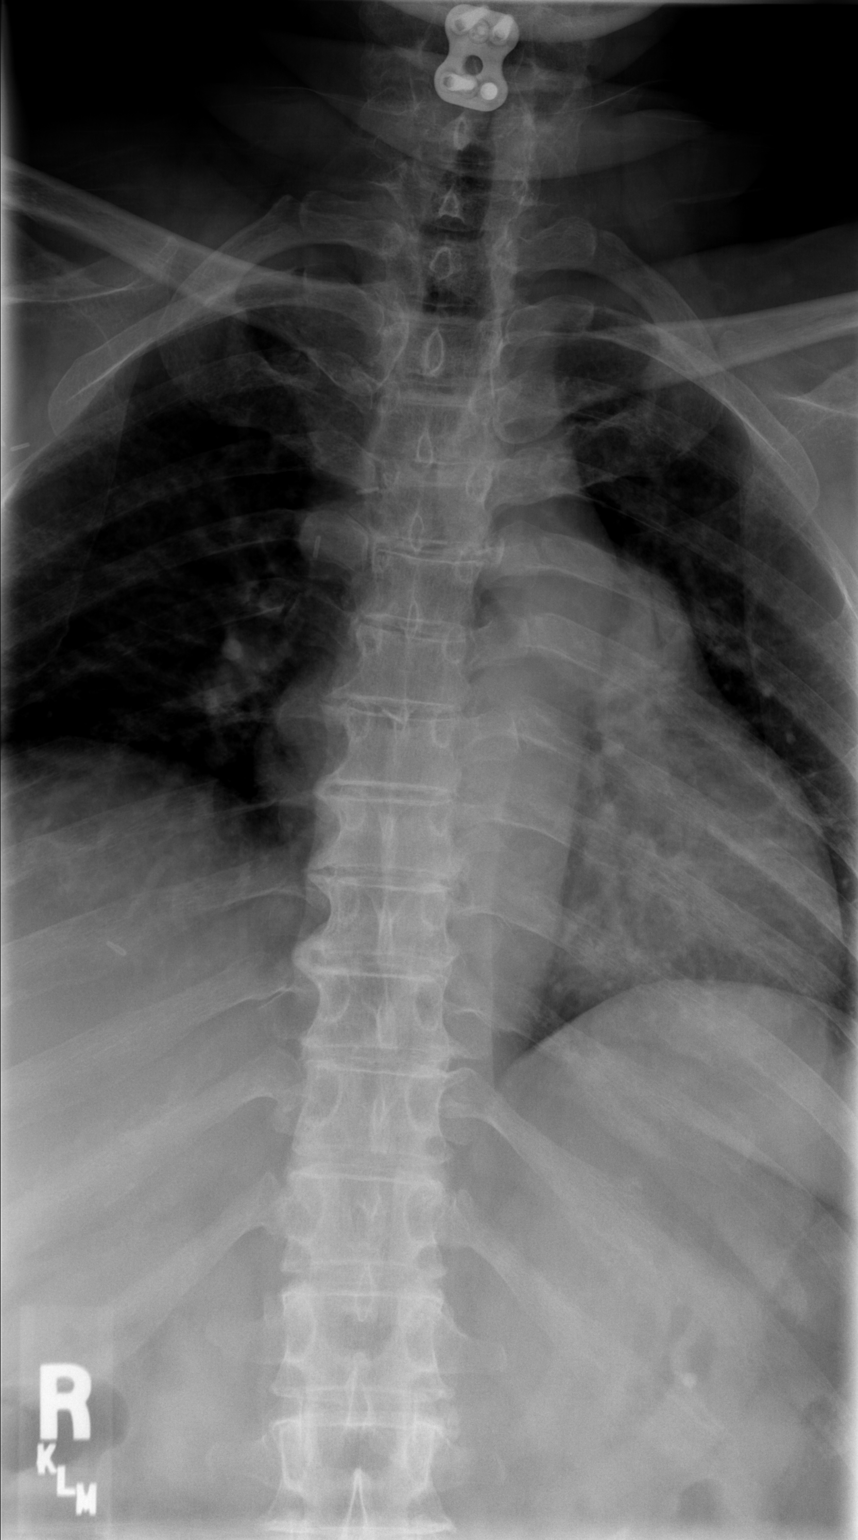

[t t-spine lat *]
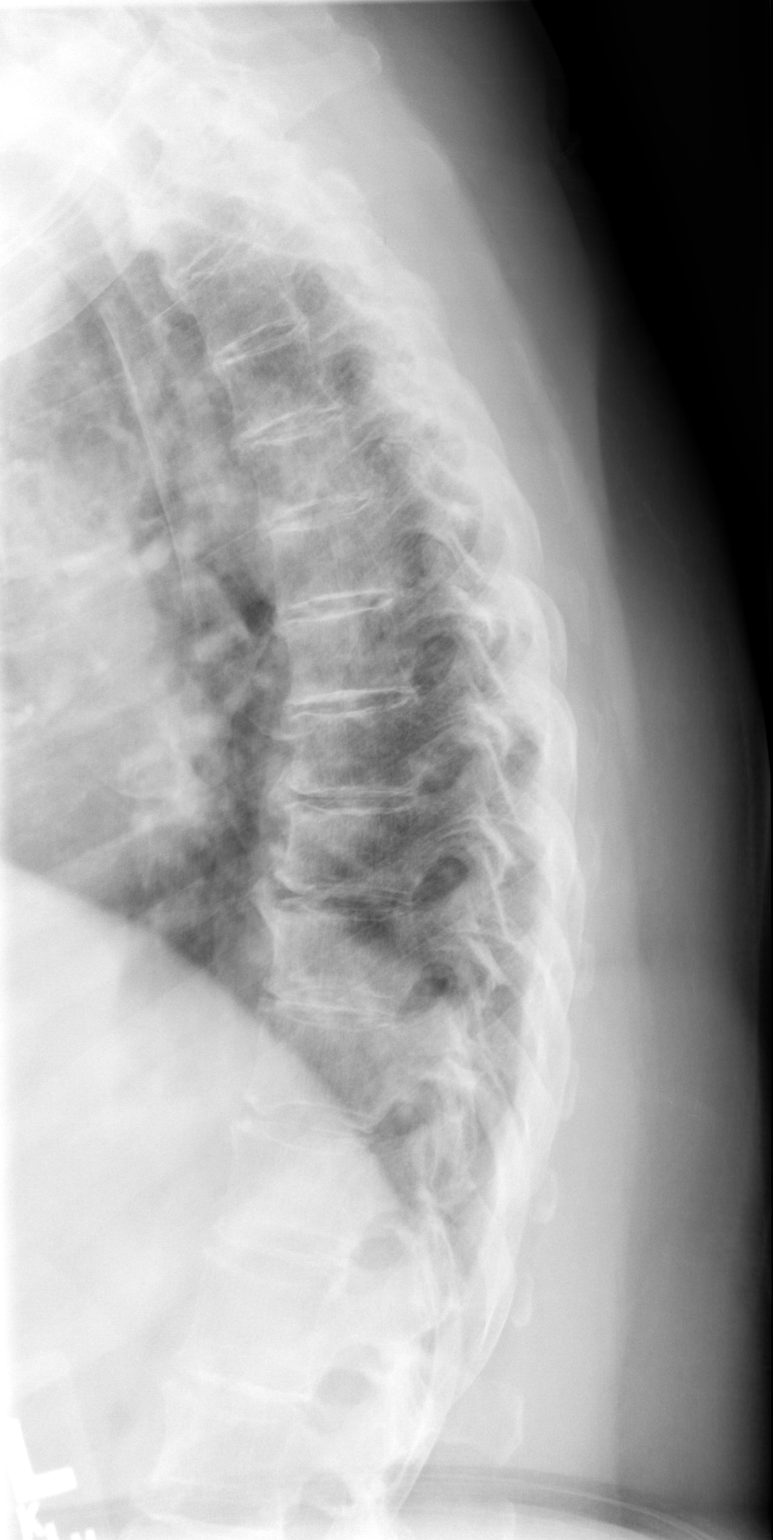

[t swimmers]
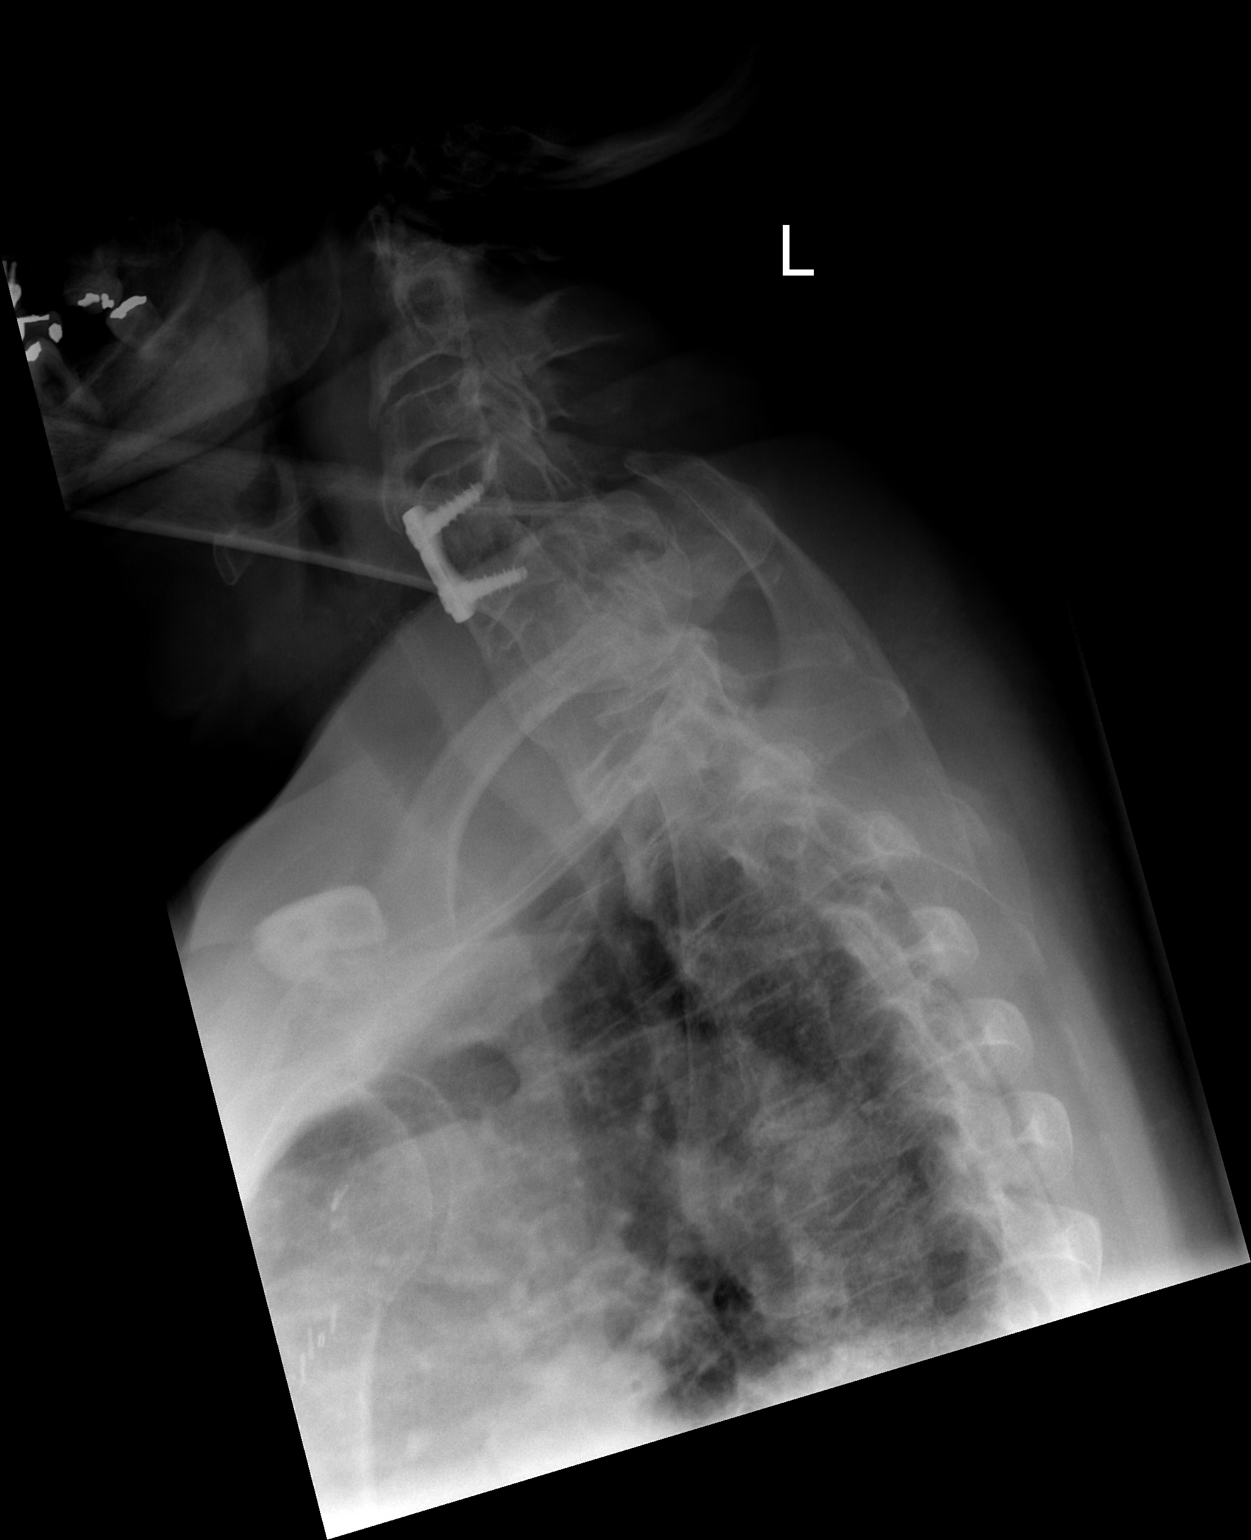

[3 of 3 positions shown; findings below may reference images not displayed]

FINDINGS: Ordinary degenerative changes effect the spine. I
considered the possibility of a compression deformity at T3 but
think this appearance on the lateral view is simply due to
overlapping shadows.  This vertebra appears normal on the frontal
view.  Posteromedial ribs appear normal.
IMPRESSION: Negative except for ordinary degenerative changes.

## 2011-07-10 IMAGING — CR DG CHEST 2V
2 series · 2 of 2 positions shown · non-contrast
Comparison: 07/06/2008

CLINICAL DATA: Chest and back pain worse on the right.  Right
breast cancer.  Squamous cell cancer.

CHEST - 2 VIEW

[w chest pa]
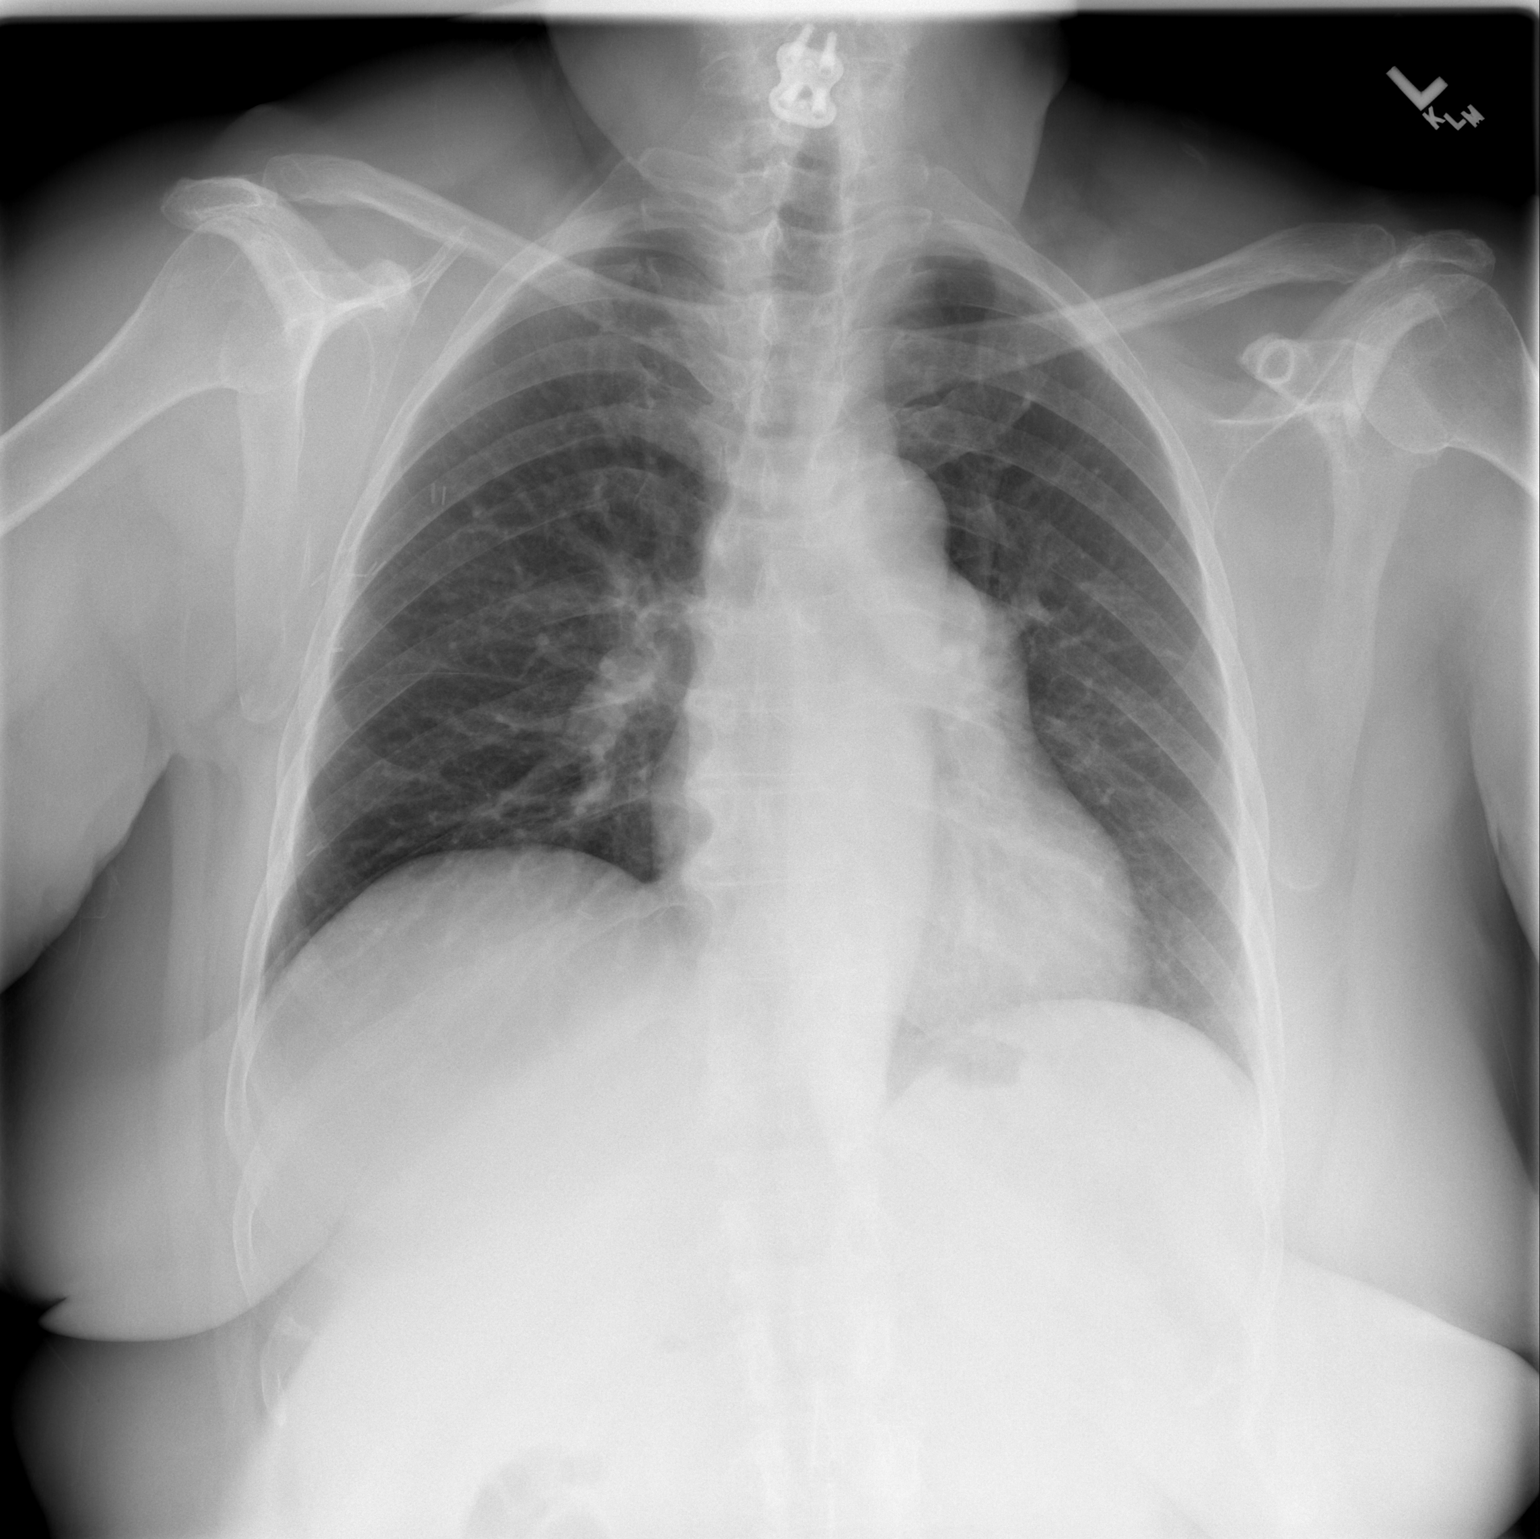

[w chest lat]
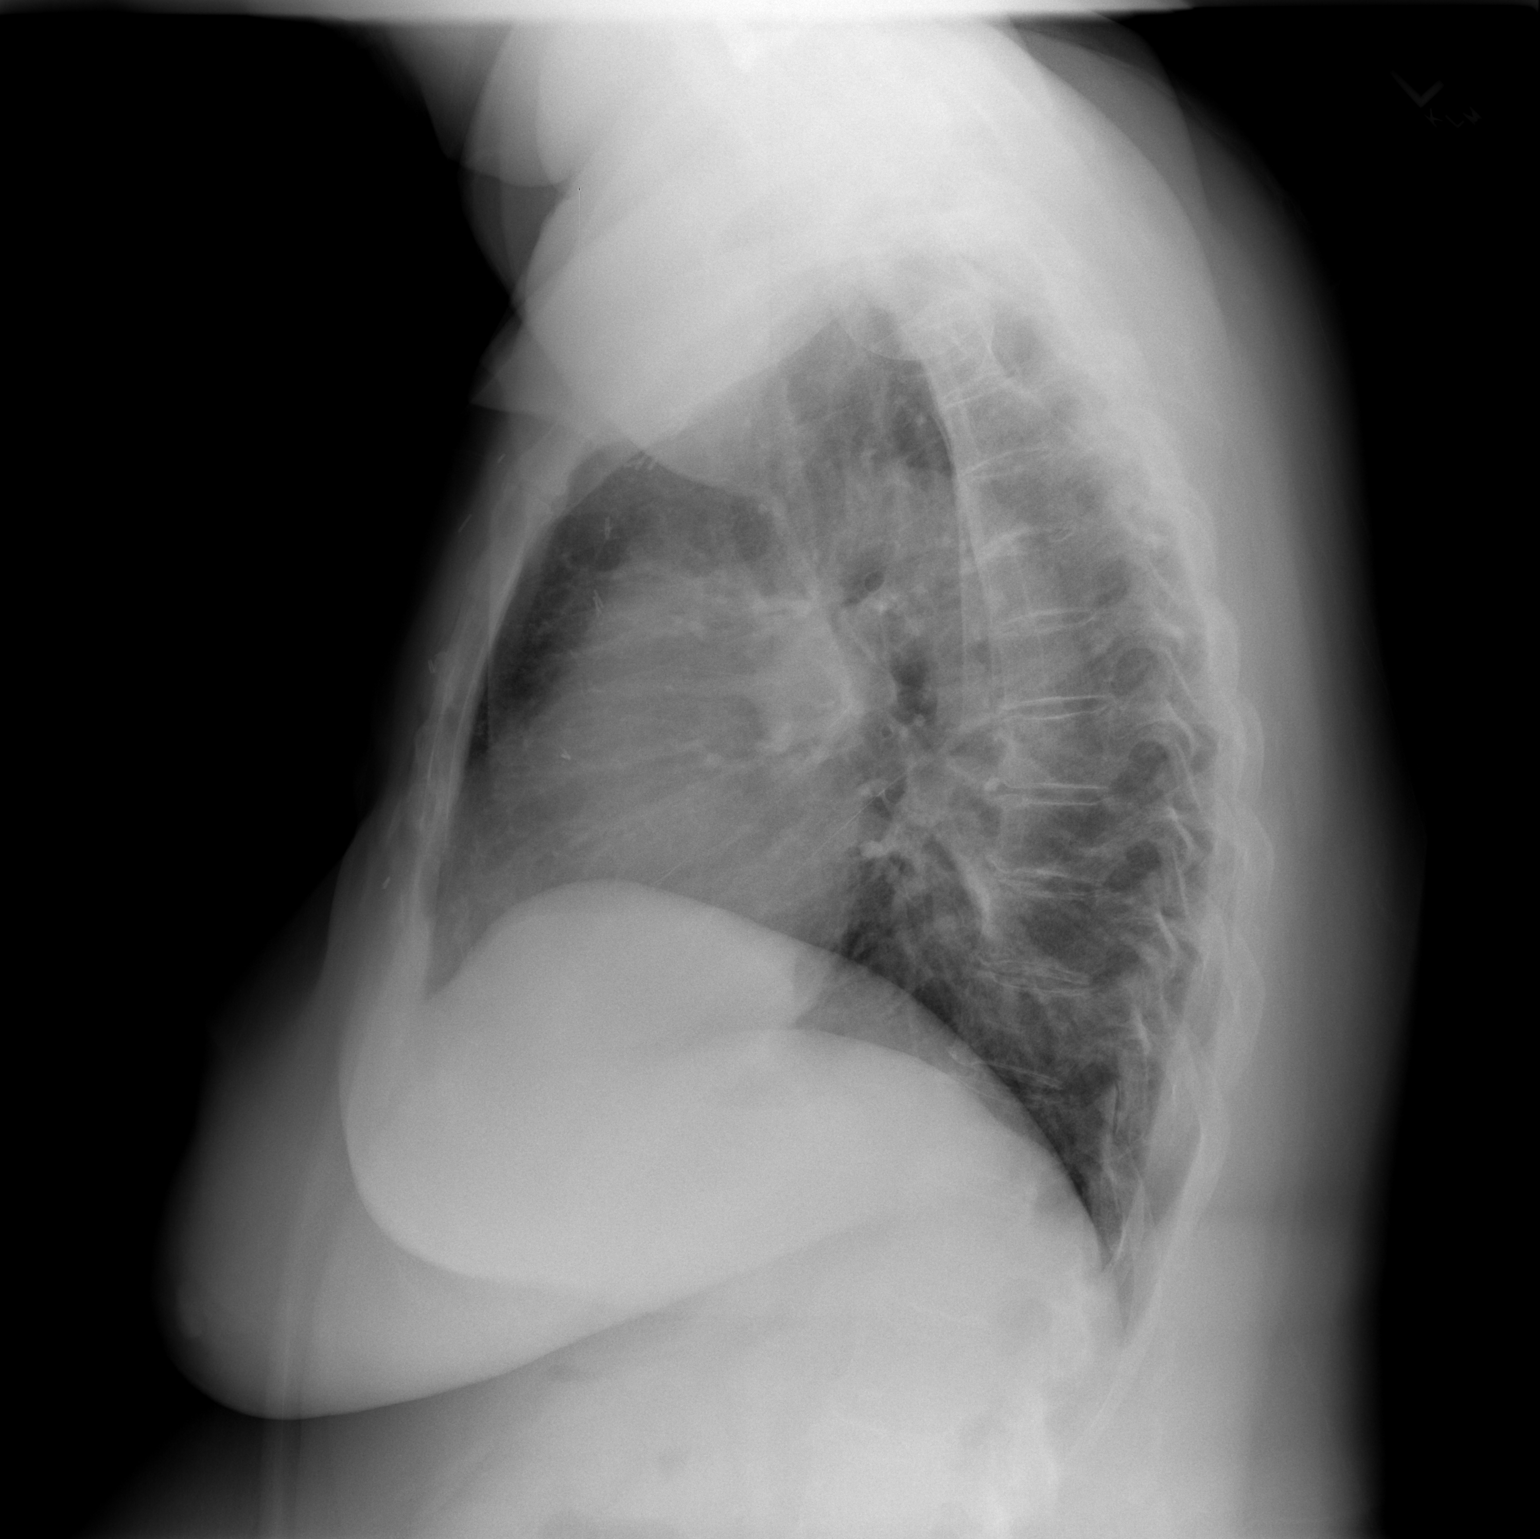

[2 of 2 positions shown; findings below may reference images not displayed]

FINDINGS: Heart size is normal.  The mediastinum is unremarkable.
The lungs are clear.  No effusions.  No bony abnormalities seen in
the chest radiograph.  Surgical clips in the right axillary region.
IMPRESSION: No active disease

## 2011-07-10 IMAGING — CR DG RIBS 2V*R*
2 series · 2 of 2 positions shown · non-contrast
Comparison: Same day

CLINICAL DATA: Posterior pain

RIGHT RIBS - 2 VIEW

[w ribs ap/pa lower right]
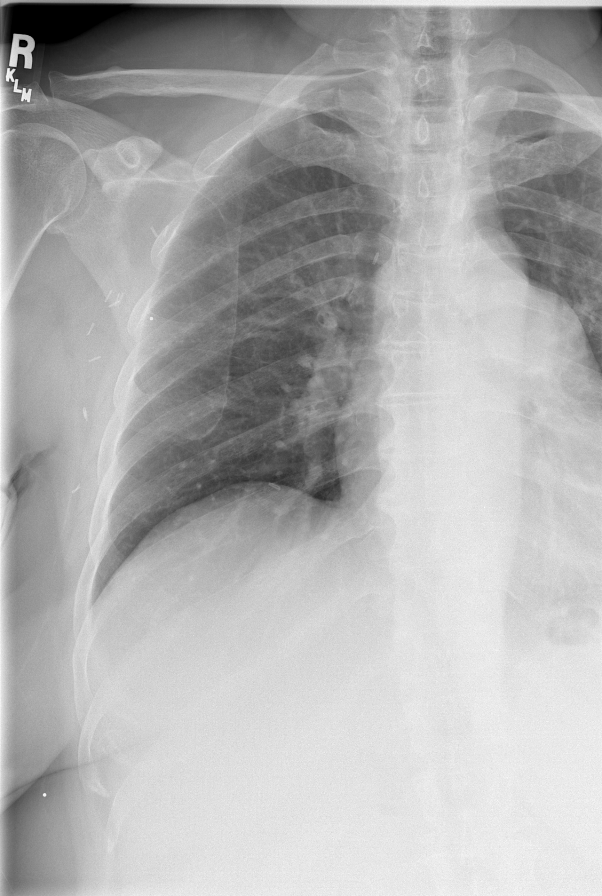

[w ribs oblique right *]
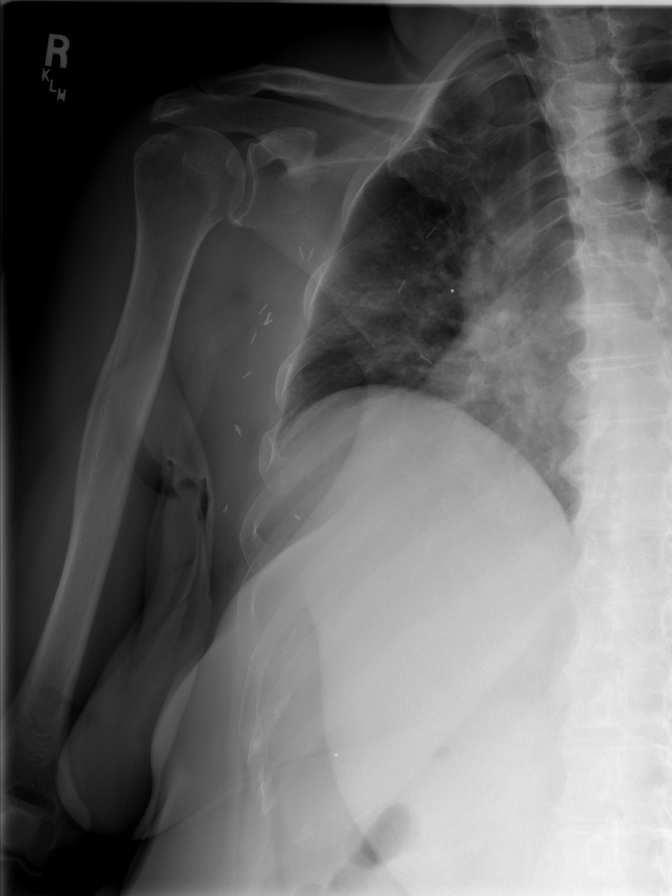

[2 of 2 positions shown; findings below may reference images not displayed]

FINDINGS: Skin marker is in place in the region of concern.  No
evidence of fracture or destructive lesion.  Surgical clips in the
right axilla.
IMPRESSION: No focal or significant finding.

## 2012-02-07 ENCOUNTER — Emergency Department (HOSPITAL_BASED_OUTPATIENT_CLINIC_OR_DEPARTMENT_OTHER): Payer: Medicare Other

## 2012-02-07 ENCOUNTER — Emergency Department (HOSPITAL_BASED_OUTPATIENT_CLINIC_OR_DEPARTMENT_OTHER)
Admission: EM | Admit: 2012-02-07 | Discharge: 2012-02-08 | Disposition: A | Discharge status: Home / Self Care | Payer: Medicare Other | Attending: Emergency Medicine | Admitting: Emergency Medicine

## 2012-02-07 ENCOUNTER — Encounter (HOSPITAL_BASED_OUTPATIENT_CLINIC_OR_DEPARTMENT_OTHER): Payer: Self-pay | Admitting: *Deleted

## 2012-02-07 DIAGNOSIS — Z853 Personal history of malignant neoplasm of breast: Secondary | ICD-10-CM | POA: Insufficient documentation

## 2012-02-07 DIAGNOSIS — Z9010 Acquired absence of unspecified breast and nipple: Secondary | ICD-10-CM | POA: Insufficient documentation

## 2012-02-07 DIAGNOSIS — Z794 Long term (current) use of insulin: Secondary | ICD-10-CM | POA: Insufficient documentation

## 2012-02-07 DIAGNOSIS — E785 Hyperlipidemia, unspecified: Secondary | ICD-10-CM | POA: Insufficient documentation

## 2012-02-07 DIAGNOSIS — E119 Type 2 diabetes mellitus without complications: Secondary | ICD-10-CM | POA: Insufficient documentation

## 2012-02-07 DIAGNOSIS — K219 Gastro-esophageal reflux disease without esophagitis: Secondary | ICD-10-CM | POA: Insufficient documentation

## 2012-02-07 DIAGNOSIS — E0789 Other specified disorders of thyroid: Secondary | ICD-10-CM | POA: Insufficient documentation

## 2012-02-07 DIAGNOSIS — Z8679 Personal history of other diseases of the circulatory system: Secondary | ICD-10-CM | POA: Insufficient documentation

## 2012-02-07 DIAGNOSIS — I10 Essential (primary) hypertension: Secondary | ICD-10-CM | POA: Insufficient documentation

## 2012-02-07 DIAGNOSIS — M1990 Unspecified osteoarthritis, unspecified site: Secondary | ICD-10-CM | POA: Insufficient documentation

## 2012-02-07 DIAGNOSIS — Z79899 Other long term (current) drug therapy: Secondary | ICD-10-CM | POA: Insufficient documentation

## 2012-02-07 DIAGNOSIS — G589 Mononeuropathy, unspecified: Secondary | ICD-10-CM | POA: Insufficient documentation

## 2012-02-07 DIAGNOSIS — Z906 Acquired absence of other parts of urinary tract: Secondary | ICD-10-CM | POA: Insufficient documentation

## 2012-02-07 DIAGNOSIS — M5430 Sciatica, unspecified side: Secondary | ICD-10-CM | POA: Insufficient documentation

## 2012-02-07 LAB — URINALYSIS, ROUTINE W REFLEX MICROSCOPIC
Bilirubin Urine: NEGATIVE
Glucose, UA: >1000 mg/dL — AB
Hgb urine dipstick: NEGATIVE
Specific Gravity, Urine: 1.036 — ABNORMAL HIGH (ref 1.005–1.030)
Urobilinogen, UA: 0.2 mg/dL (ref 0.0–1.0)
pH: 6 (ref 5.0–8.0)

## 2012-02-07 LAB — URINE MICROSCOPIC-ADD ON

## 2012-02-07 LAB — GLUCOSE, CAPILLARY: Glucose-Capillary: 239 mg/dL — ABNORMAL HIGH (ref 70–99)

## 2012-02-07 MED ORDER — HYDROMORPHONE HCL PF 2 MG/ML IJ SOLN
2.0000 mg | Freq: Once | INTRAMUSCULAR | Status: AC
  Administered 2012-02-07: 2 mg via INTRAVENOUS
  Filled 2012-02-07: qty 1

## 2012-02-07 NOTE — ED Provider Notes (Signed)
History     CSN: [id number]  Arrival date & time 02/07/12  2003   First MD Initiated Contact with Patient 02/07/12 2314      Chief Complaint  Patient presents with  . Back Pain    (Consider location/radiation/quality/duration/timing/severity/associated sxs/prior treatment) HPI This is a 61 year old female with a history of sciatica. She states her lower back has been hurting for the past several weeks and has become moderate to severe over the past 3 or 4 days. The pain is in the right paralumbar region radiating to the right side. It is worse with movement. It is improved with rest. It is not been adequately treated with Percocet. She denies any motor or sensory changes. She denies any bowel or bladder changes. She had her back injected last December but states it only gave her relief for 2 days.  Past Medical History  Diagnosis Date  . GERD (gastroesophageal reflux disease)   . Hypertension   . Breast cancer   . Diabetes mellitus     Type II  . Headache   . Hypothyroidism   . Osteoarthritis   . Peripheral neuropathy   . Cardiomyopathy   . Chest pain 06/2005    Hospitalized, dystolic dysfunction    Past Surgical History  Procedure Date  . Abdominal hysterectomy 1993  . Total knee arthroplasty 2000    Right  . Mastectomy 12/2003  . Thyroidectomy 1977    Right  . Neck surgery 2001  . Shoulder surgery 2003    Left  . Lymph node removal 2003    Squamous cell CA  . Cardiac catheterization ? 2005 / 2007  . Adenosin cardiolite 07/2005    (+) wall motion abnormality  . Esophagogastroduodenoscopy 2005    GERD  . [place] of abdomen 07/2005    Fatty liver / kidney stones  . Ct abdomen and pelvis 02/2010    Same    Family History  Problem Relation Age of Onset  . Hypertension Mother   . Diabetes Father   . Hypertension Father   . Hypertension Sister   . Diabetes Other     Family Hx. of Diabetes  . Hypertension Other     Family History of HTN  . Hypertension  Brother   . Hypertension Brother   . Diabetes Brother     History  Substance Use Topics  . Smoking status: Never Smoker   . Smokeless tobacco: Not on file  . Alcohol Use: No    OB History    Grav Para Term Preterm Abortions TAB SAB Ect Mult Living                  Review of Systems  All other systems reviewed and are negative.    Allergies  Dilaudid; Ampicillin; and Latex  Home Medications   Current Outpatient Rx  Name Route Sig Dispense Refill  . CYCLOBENZAPRINE HCL 10 MG PO TABS Oral Take 10 mg by mouth 3 (three) times daily as needed.    . L-METHYLFOLATE-B6-B12 3-35-2 MG PO TABS Oral Take 1 tablet by mouth daily.    . OXYCODONE-ACETAMINOPHEN 5-325 MG PO TABS Oral Take 1 tablet by mouth every 4 (four) hours as needed.    [name] Kitchen FLUTICASONE PROPIONATE 50 MCG/ACT NA SUSP Nasal 2 sprays by Nasal route daily.      . IBUPROFEN 200 MG PO TABS Oral Take 200 mg by mouth every 6 (six) hours as needed.      . INSULIN GLARGINE 100 UNIT/ML  [place] SOLN Subcutaneous Inject 50 Units into the skin daily.      . INSULIN GLARGINE 100 UNIT/ML  SOLN Subcutaneous Inject 50 Units into the skin at bedtime.      [name] Kitchen LEVOTHYROXINE SODIUM 100 MCG PO TABS Oral Take 1 tablet (100 mcg total) by mouth daily. 30 tablet 6  . LOSARTAN POTASSIUM-HCTZ 50-12.5 MG PO TABS Oral Take 1 tablet by mouth daily.      [name] Kitchen METFORMIN HCL 850 MG PO TABS Oral Take 850 mg by mouth 3 (three) times daily.      [name] Kitchen NITROGLYCERIN 0.4 MG SL SUBL Sublingual Place 0.4 mg under the tongue every 5 (five) minutes as needed.      [name] Kitchen RANITIDINE HCL 75 MG PO TABS Oral Take 75 mg by mouth daily as needed.        BP 154/72  Pulse 74  Temp 98.4 F (36.9 C) (Oral)  Resp 18  Wt 203 lb (92.08 kg)  SpO2 99%  Physical Exam General: Well-developed, well-nourished female in no acute distress; appearance consistent with age of record HENT: normocephalic, atraumatic Eyes: pupils equal round and reactive to light; extraocular muscles intact Neck:  supple Heart: regular rate and rhythm Lungs: clear to auscultation bilaterally Abdomen: soft; nondistended; nontender Back: Right paralumbar tenderness; positive straight leg raise on the right at about 30 Extremities: No deformity; limited range of motion at right hip due to pain Neurologic: Awake, alert and oriented; motor function intact in all extremities and symmetric; no facial droop; sensation intact and symmetric Skin: Warm and dry Psychiatric: Normal mood and affect    ED Course  Procedures (including critical care time)     MDM   Nursing notes and vitals signs, including pulse oximetry, reviewed.  Summary of this visit's results, reviewed by myself:  Labs:  Results for orders placed during the hospital encounter of 02/07/12  URINALYSIS, ROUTINE W REFLEX MICROSCOPIC      Component Value Range   Color, Urine YELLOW  YELLOW   APPearance CLOUDY (*) CLEAR   Specific Gravity, Urine 1.036 (*) 1.005 - 1.030   pH 6.0  5.0 - 8.0   Glucose, UA >1000 (*) NEGATIVE mg/dL   Hgb urine dipstick NEGATIVE  NEGATIVE   Bilirubin Urine NEGATIVE  NEGATIVE   Ketones, ur NEGATIVE  NEGATIVE mg/dL   Protein, ur NEGATIVE  NEGATIVE mg/dL   Urobilinogen, UA 0.2  0.0 - 1.0 mg/dL   Nitrite NEGATIVE  NEGATIVE   Leukocytes, UA NEGATIVE  NEGATIVE  URINE MICROSCOPIC-ADD ON      Component Value Range   Squamous Epithelial / LPF FEW (*) RARE   Bacteria, UA RARE  RARE  GLUCOSE, CAPILLARY      Component Value Range   Glucose-Capillary 239 (*) 70 - 99 mg/dL   Comment 1 Documented in Chart      Imaging Studies: Dg Lumbar Spine Complete  02/08/2012  *RADIOLOGY REPORT*  Clinical Data: Severe low back pain for 3 days.  LUMBAR SPINE - COMPLETE 4+ VIEW  Comparison: Plain films lumbar spine 02/19/2011 MRI lumbar spine 03/16/2011.  Findings: The vertebral body height is maintained.  Lower lumbar facet degenerative change is again seen.  Alignment is unremarkable.  Loss of disc space height at L4-5 and  L5-S1 is unchanged.  Two calcifications in the left upper quadrant compatible with nonobstructing renal stones are unchanged.  IMPRESSION:  1.  No acute finding. 2.  No change in the appearance of lower lumbar degenerative disease. 3.  Left  upper quadrant calcifications compatible with nonobstructing renal stones appear stable.   Original Report Authenticated By: Arvid Right. D'ALESSIO, M.D.    1:28 AM Pain improved with IM Dilaudid. Patient became nauseated requiring Zofran ODT. We will add OxyContin to the patient's oxycodone she has been tolerating oxycodone.         Wynetta Fines, MD 02/08/12 [phone]

## 2012-02-07 NOTE — ED Notes (Signed)
MD at bedside. 

## 2012-02-07 NOTE — ED Notes (Signed)
Pt c/o middle lower back pain radiating to her right thigh x4 days. Pt sts she has had this pain before.

## 2012-02-07 NOTE — ED Notes (Signed)
cbg 239

## 2012-02-07 NOTE — ED Notes (Signed)
Patient transported to X-ray 

## 2012-02-08 MED ORDER — OXYCODONE HCL ER 15 MG PO T12A: 15.0000 mg | EXTENDED_RELEASE_TABLET | Freq: Two times a day (BID) | ORAL | Status: DC

## 2012-02-08 MED ORDER — PROMETHAZINE HCL 25 MG PO TABS: 25.0000 mg | ORAL_TABLET | Freq: Four times a day (QID) | ORAL | Status: DC | PRN

## 2012-02-08 MED ORDER — ONDANSETRON 8 MG PO TBDP
8.0000 mg | ORAL_TABLET | Freq: Once | ORAL | Status: AC
  Administered 2012-02-08: 8 mg via ORAL

## 2012-02-08 MED ORDER — ONDANSETRON 8 MG PO TBDP
ORAL_TABLET | ORAL | Status: AC
  Filled 2012-02-08: qty 1

## 2012-02-13 ENCOUNTER — Other Ambulatory Visit: Payer: Self-pay | Admitting: Specialist

## 2012-02-13 DIAGNOSIS — M5410 Radiculopathy, site unspecified: Secondary | ICD-10-CM

## 2012-02-13 DIAGNOSIS — M545 Low back pain, unspecified: Secondary | ICD-10-CM

## 2012-02-17 ENCOUNTER — Ambulatory Visit
Admission: RE | Admit: 2012-02-17 | Discharge: 2012-02-17 | Disposition: A | Discharge status: Home / Self Care | Payer: Medicare Other | Source: Ambulatory Visit | Attending: Specialist | Admitting: Specialist

## 2012-02-17 DIAGNOSIS — M545 Low back pain, unspecified: Secondary | ICD-10-CM

## 2012-02-17 DIAGNOSIS — M5410 Radiculopathy, site unspecified: Secondary | ICD-10-CM

## 2012-03-19 ENCOUNTER — Emergency Department (HOSPITAL_COMMUNITY): Payer: Medicare Other

## 2012-03-19 ENCOUNTER — Observation Stay (HOSPITAL_COMMUNITY)
Admission: EM | Admit: 2012-03-19 | Discharge: 2012-03-20 | Disposition: A | Discharge status: Home / Self Care | Payer: Medicare Other | Attending: Internal Medicine | Admitting: Internal Medicine

## 2012-03-19 ENCOUNTER — Encounter (HOSPITAL_COMMUNITY): Payer: Self-pay | Admitting: *Deleted

## 2012-03-19 DIAGNOSIS — R0602 Shortness of breath: Secondary | ICD-10-CM | POA: Insufficient documentation

## 2012-03-19 DIAGNOSIS — C801 Malignant (primary) neoplasm, unspecified: Secondary | ICD-10-CM | POA: Insufficient documentation

## 2012-03-19 DIAGNOSIS — R52 Pain, unspecified: Secondary | ICD-10-CM

## 2012-03-19 DIAGNOSIS — K219 Gastro-esophageal reflux disease without esophagitis: Secondary | ICD-10-CM

## 2012-03-19 DIAGNOSIS — G909 Disorder of the autonomic nervous system, unspecified: Secondary | ICD-10-CM

## 2012-03-19 DIAGNOSIS — Z79899 Other long term (current) drug therapy: Secondary | ICD-10-CM | POA: Insufficient documentation

## 2012-03-19 DIAGNOSIS — Z853 Personal history of malignant neoplasm of breast: Secondary | ICD-10-CM

## 2012-03-19 DIAGNOSIS — I428 Other cardiomyopathies: Secondary | ICD-10-CM

## 2012-03-19 DIAGNOSIS — G609 Hereditary and idiopathic neuropathy, unspecified: Secondary | ICD-10-CM

## 2012-03-19 DIAGNOSIS — R059 Cough, unspecified: Secondary | ICD-10-CM | POA: Insufficient documentation

## 2012-03-19 DIAGNOSIS — M549 Dorsalgia, unspecified: Secondary | ICD-10-CM

## 2012-03-19 DIAGNOSIS — M1990 Unspecified osteoarthritis, unspecified site: Secondary | ICD-10-CM

## 2012-03-19 DIAGNOSIS — R109 Unspecified abdominal pain: Secondary | ICD-10-CM

## 2012-03-19 DIAGNOSIS — I209 Angina pectoris, unspecified: Secondary | ICD-10-CM | POA: Insufficient documentation

## 2012-03-19 DIAGNOSIS — R079 Chest pain, unspecified: Principal | ICD-10-CM | POA: Insufficient documentation

## 2012-03-19 DIAGNOSIS — E039 Hypothyroidism, unspecified: Secondary | ICD-10-CM | POA: Insufficient documentation

## 2012-03-19 DIAGNOSIS — E1149 Type 2 diabetes mellitus with other diabetic neurological complication: Secondary | ICD-10-CM | POA: Diagnosis present

## 2012-03-19 DIAGNOSIS — Z862 Personal history of diseases of the blood and blood-forming organs and certain disorders involving the immune mechanism: Secondary | ICD-10-CM

## 2012-03-19 DIAGNOSIS — M25511 Pain in right shoulder: Secondary | ICD-10-CM

## 2012-03-19 DIAGNOSIS — Z87442 Personal history of urinary calculi: Secondary | ICD-10-CM

## 2012-03-19 DIAGNOSIS — K7689 Other specified diseases of liver: Secondary | ICD-10-CM

## 2012-03-19 DIAGNOSIS — I10 Essential (primary) hypertension: Secondary | ICD-10-CM | POA: Insufficient documentation

## 2012-03-19 DIAGNOSIS — Z9010 Acquired absence of unspecified breast and nipple: Secondary | ICD-10-CM | POA: Insufficient documentation

## 2012-03-19 DIAGNOSIS — M5137 Other intervertebral disc degeneration, lumbosacral region: Secondary | ICD-10-CM

## 2012-03-19 DIAGNOSIS — E1165 Type 2 diabetes mellitus with hyperglycemia: Secondary | ICD-10-CM | POA: Insufficient documentation

## 2012-03-19 DIAGNOSIS — R51 Headache: Secondary | ICD-10-CM

## 2012-03-19 DIAGNOSIS — Z8639 Personal history of other endocrine, nutritional and metabolic disease: Secondary | ICD-10-CM

## 2012-03-19 DIAGNOSIS — M5030 Other cervical disc degeneration, unspecified cervical region: Secondary | ICD-10-CM

## 2012-03-19 DIAGNOSIS — E119 Type 2 diabetes mellitus without complications: Secondary | ICD-10-CM

## 2012-03-19 DIAGNOSIS — R05 Cough: Secondary | ICD-10-CM | POA: Insufficient documentation

## 2012-03-19 DIAGNOSIS — Z794 Long term (current) use of insulin: Secondary | ICD-10-CM | POA: Insufficient documentation

## 2012-03-19 DIAGNOSIS — IMO0002 Reserved for concepts with insufficient information to code with codable children: Secondary | ICD-10-CM | POA: Insufficient documentation

## 2012-03-19 HISTORY — DX: Migraine, unspecified, not intractable, without status migrainosus: G43.909

## 2012-03-19 HISTORY — DX: Type 2 diabetes mellitus without complications: E11.9

## 2012-03-19 HISTORY — DX: Unspecified osteoarthritis, unspecified site: M19.90

## 2012-03-19 HISTORY — DX: Angina pectoris, unspecified: I20.9

## 2012-03-19 HISTORY — DX: Low back pain: M54.5

## 2012-03-19 HISTORY — DX: Other specified postprocedural states: Z98.890

## 2012-03-19 HISTORY — DX: Other chronic pain: G89.29

## 2012-03-19 HISTORY — DX: Sciatica, unspecified side: M54.30

## 2012-03-19 HISTORY — DX: Personal history of other diseases of the digestive system: Z87.19

## 2012-03-19 HISTORY — DX: Nausea with vomiting, unspecified: R11.2

## 2012-03-19 HISTORY — DX: Low back pain, unspecified: M54.50

## 2012-03-19 LAB — COMPREHENSIVE METABOLIC PANEL
ALT: 20 U/L (ref 0–35)
AST: 22 U/L (ref 0–37)
Alkaline Phosphatase: 64 U/L (ref 39–117)
CO2: 27 mEq/L (ref 19–32)
GFR calc Af Amer: >90 mL/min (ref >90)
GFR calc non Af Amer: >90 mL/min (ref >90)
Glucose, Bld: 187 mg/dL — ABNORMAL HIGH (ref 70–99)
Potassium: 4 mEq/L (ref 3.5–5.1)
Sodium: 138 mEq/L (ref 135–145)
Total Protein: 7.9 g/dL (ref 6.0–8.3)

## 2012-03-19 LAB — CBC WITH DIFFERENTIAL/PLATELET
Basophils Absolute: 0 10*3/uL (ref 0.0–0.1)
Lymphocytes Relative: 43 % (ref 12–46)
Lymphs Abs: 2.7 10*3/uL (ref 0.7–4.0)
Neutrophils Relative %: 46 % (ref 43–77)
Platelets: 251 10*3/uL (ref 150–400)
RBC: 4.86 MIL/uL (ref 3.87–5.11)
RDW: 13.6 % (ref 11.5–15.5)
WBC: 6.2 10*3/uL (ref 4.0–10.5)

## 2012-03-19 MED ORDER — ONDANSETRON HCL 4 MG/2ML IJ SOLN
4.0000 mg | Freq: Once | INTRAMUSCULAR | Status: AC
  Administered 2012-03-20: 4 mg via INTRAVENOUS
  Filled 2012-03-19: qty 2

## 2012-03-19 MED ORDER — ASPIRIN 81 MG PO CHEW
324.0000 mg | CHEWABLE_TABLET | Freq: Once | ORAL | Status: AC
  Administered 2012-03-20: 324 mg via ORAL
  Filled 2012-03-19: qty 4

## 2012-03-19 MED ORDER — MORPHINE SULFATE 4 MG/ML IJ SOLN
4.0000 mg | Freq: Once | INTRAMUSCULAR | Status: AC
  Administered 2012-03-20: 4 mg via INTRAVENOUS
  Filled 2012-03-19: qty 1

## 2012-03-19 NOTE — ED Notes (Signed)
The pt has had mid-chest pain  For 1 hour with sl sob .  She describes the pain as a tightness

## 2012-03-19 NOTE — ED Notes (Signed)
Dr. Plunkett at bedside.  

## 2012-03-20 ENCOUNTER — Encounter (HOSPITAL_COMMUNITY): Payer: Self-pay | Admitting: General Practice

## 2012-03-20 ENCOUNTER — Observation Stay (HOSPITAL_COMMUNITY): Payer: Medicare Other

## 2012-03-20 DIAGNOSIS — E119 Type 2 diabetes mellitus without complications: Secondary | ICD-10-CM

## 2012-03-20 DIAGNOSIS — R072 Precordial pain: Secondary | ICD-10-CM

## 2012-03-20 DIAGNOSIS — R079 Chest pain, unspecified: Secondary | ICD-10-CM | POA: Diagnosis present

## 2012-03-20 DIAGNOSIS — E039 Hypothyroidism, unspecified: Secondary | ICD-10-CM

## 2012-03-20 LAB — CBC
MCH: 29.1 pg (ref 26.0–34.0)
MCV: 89.5 fL (ref 78.0–100.0)
Platelets: 249 10*3/uL (ref 150–400)
RDW: 13.5 % (ref 11.5–15.5)
WBC: 6.3 10*3/uL (ref 4.0–10.5)

## 2012-03-20 LAB — GLUCOSE, CAPILLARY
Glucose-Capillary: 147 mg/dL — ABNORMAL HIGH (ref 70–99)
Glucose-Capillary: 197 mg/dL — ABNORMAL HIGH (ref 70–99)
Glucose-Capillary: 218 mg/dL — ABNORMAL HIGH (ref 70–99)

## 2012-03-20 LAB — LIPID PANEL: HDL: 32 mg/dL — ABNORMAL LOW (ref >39)

## 2012-03-20 LAB — BASIC METABOLIC PANEL
CO2: 27 mEq/L (ref 19–32)
Calcium: 10.6 mg/dL — ABNORMAL HIGH (ref 8.4–10.5)
Creatinine, Ser: 0.57 mg/dL (ref 0.50–1.10)
GFR calc non Af Amer: >90 mL/min (ref >90)

## 2012-03-20 LAB — TSH: TSH: 1.787 u[IU]/mL (ref 0.350–4.500)

## 2012-03-20 LAB — TROPONIN I: Troponin I: <0.3 ng/mL (ref <0.30)

## 2012-03-20 LAB — D-DIMER, QUANTITATIVE: D-Dimer, Quant: 0.93 ug/mL-FEU — ABNORMAL HIGH (ref 0.00–0.48)

## 2012-03-20 MED ORDER — ACETAMINOPHEN 650 MG RE SUPP: 650.0000 mg | Freq: Four times a day (QID) | RECTAL | Status: DC | PRN

## 2012-03-20 MED ORDER — NITROGLYCERIN 0.4 MG/HR TD PT24
0.4000 mg | MEDICATED_PATCH | Freq: Every day | TRANSDERMAL | Status: DC
  Administered 2012-03-20: 0.4 mg via TRANSDERMAL
  Filled 2012-03-20: qty 1

## 2012-03-20 MED ORDER — ASPIRIN EC 325 MG PO TBEC
325.0000 mg | DELAYED_RELEASE_TABLET | Freq: Every day | ORAL | Status: DC
  Administered 2012-03-20: 325 mg via ORAL
  Filled 2012-03-20: qty 1

## 2012-03-20 MED ORDER — OXYCODONE HCL ER 15 MG PO T12A
15.0000 mg | EXTENDED_RELEASE_TABLET | Freq: Two times a day (BID) | ORAL | Status: DC
  Administered 2012-03-20 (×2): 15 mg via ORAL
  Filled 2012-03-20 (×2): qty 1

## 2012-03-20 MED ORDER — ONDANSETRON HCL 4 MG/2ML IJ SOLN: 4.0000 mg | Freq: Four times a day (QID) | INTRAMUSCULAR | Status: DC | PRN

## 2012-03-20 MED ORDER — INSULIN GLARGINE 100 UNIT/ML ~~LOC~~ SOLN
100.0000 [IU] | Freq: Every day | SUBCUTANEOUS | Status: DC
  Administered 2012-03-20: 100 [IU] via SUBCUTANEOUS

## 2012-03-20 MED ORDER — OXYCODONE-ACETAMINOPHEN 10-325 MG PO TABS: 1.0000 | ORAL_TABLET | Freq: Four times a day (QID) | ORAL | Status: DC | PRN

## 2012-03-20 MED ORDER — OXYCODONE-ACETAMINOPHEN 5-325 MG PO TABS: 1.0000 | ORAL_TABLET | ORAL | Status: DC | PRN

## 2012-03-20 MED ORDER — SODIUM CHLORIDE 0.9 % IJ SOLN
3.0000 mL | Freq: Two times a day (BID) | INTRAMUSCULAR | Status: DC
  Administered 2012-03-20: 3 mL via INTRAVENOUS

## 2012-03-20 MED ORDER — LOSARTAN POTASSIUM 50 MG PO TABS
50.0000 mg | ORAL_TABLET | Freq: Every day | ORAL | Status: DC
  Administered 2012-03-20: 50 mg via ORAL
  Filled 2012-03-20: qty 1

## 2012-03-20 MED ORDER — LEVOTHYROXINE SODIUM 100 MCG PO TABS
100.0000 ug | ORAL_TABLET | Freq: Every day | ORAL | Status: DC
  Administered 2012-03-20: 100 ug via ORAL
  Filled 2012-03-20 (×2): qty 1

## 2012-03-20 MED ORDER — OXYCODONE-ACETAMINOPHEN 5-325 MG PO TABS
1.0000 | ORAL_TABLET | ORAL | Status: DC | PRN
  Administered 2012-03-20: 1 via ORAL
  Filled 2012-03-20: qty 1

## 2012-03-20 MED ORDER — ENOXAPARIN SODIUM 40 MG/0.4ML ~~LOC~~ SOLN
40.0000 mg | SUBCUTANEOUS | Status: DC
  Filled 2012-03-20: qty 0.4

## 2012-03-20 MED ORDER — INSULIN ASPART 100 UNIT/ML ~~LOC~~ SOLN
0.0000 [IU] | Freq: Three times a day (TID) | SUBCUTANEOUS | Status: DC
  Administered 2012-03-20: 2 [IU] via SUBCUTANEOUS
  Administered 2012-03-20: 3 [IU] via SUBCUTANEOUS

## 2012-03-20 MED ORDER — ACETAMINOPHEN 325 MG PO TABS: 650.0000 mg | ORAL_TABLET | Freq: Four times a day (QID) | ORAL | Status: DC | PRN

## 2012-03-20 MED ORDER — IOHEXOL 350 MG/ML SOLN
100.0000 mL | Freq: Once | INTRAVENOUS | Status: AC | PRN
  Administered 2012-03-20: 100 mL via INTRAVENOUS

## 2012-03-20 MED ORDER — SODIUM CHLORIDE 0.9 % IV SOLN: INTRAVENOUS | Status: DC

## 2012-03-20 MED ORDER — ONDANSETRON HCL 4 MG PO TABS: 4.0000 mg | ORAL_TABLET | Freq: Four times a day (QID) | ORAL | Status: DC | PRN

## 2012-03-20 NOTE — Progress Notes (Signed)
[facility] PROGRESS NOTE  ASE WIITALA [id number] DOB: 1950/12/01 DOA: 03/19/2012 PCP: Odette Fraction, MD  Assessment/Plan: #1. Chest pain we'll rule out ACS - continues with mild intermittent chest "tightness".  CE neg to date, Tele without event, d-dimer elevated and CT angio chest pending. Continue nitroglycerin paste, aspirin. Will get echo. Cards on board #2. Mild hypercalcemia - patient states she is on vitamin D supplementation. Her charts mention she has had hyperparathyroidism which patient does not recall. PTH and vitamin D levels pending. Calcium level trending down slightly.  Holding HCTZ due to hypercalcemia. Patient does have history of squamous cell carcinoma of unknown primary which has per patient has been under remission and her calcium level status to be closely followed.  #3. Diabetes mellitus type 2 -uncontrolled. A1C 12 last year. Will recheck. continue home medications.  #4. Hypertension -  Fair control. continue home medications except for HCTZ see #2.  #5. Hypothyroidism -  TSH pending continue Synthroid.  #6. History of squamous cell carcinoma of unknown primary under remission per patient   Code Status: full Family Communication: pt at bedside Disposition Plan: home when ready hopefully 24 hours   Consultants:  cards  Procedures:    Antibiotics:  none  HPI/Subjective: Sitting on side of bed reading paper. Denies pain/discomfort. Reports intermittent "pressure that goes to back" during the night. NAD  Objective: Filed Vitals:   03/20/12 0100 03/20/12 0200 03/20/12 0200 03/20/12 0230  BP: 121/57  120/74 136/73  Pulse: 76 95  79  Temp:    98.7 F (37.1 C)  TempSrc:    Oral  Resp:      Height:    5\' 5"  (1.651 m)  Weight:    88.315 kg (194 lb 11.2 oz)  SpO2: 95% 94%  98%   No intake or output data in the 24 hours ending 03/20/12 0815 Filed Weights   03/20/12 0230  Weight: 88.315 kg (194 lb 11.2 oz)    Exam:   General:   Alert oriented x3. NAD  Cardiovascular: RRR No MGR trace LEE PPP  Respiratory: normal effort BSCTAB No wheeze/rhonchi/rales  Abdomen: obese soft +BS non-tender to palpation  Data Reviewed: Basic Metabolic Panel:  Lab [id number] 03/19/12 2216  NA 138 138  K 3.8 4.0  CL 102 100  CO2 27 27  GLUCOSE 145* 187*  BUN 13 13  CREATININE 0.57 0.60  CALCIUM 10.6* 10.9*  MG -- --  PHOS -- --   Liver Function Tests:  Lab 03/19/12 2216  AST 22  ALT 20  ALKPHOS 64  BILITOT 0.5  PROT 7.9  ALBUMIN 3.8   No results found for this basename: LIPASE:5,AMYLASE:5 in the last 168 hours No results found for this basename: AMMONIA:5 in the last 168 hours CBC:  Lab 03/20/12 0253 03/19/12 2216  WBC 6.3 6.2  NEUTROABS -- 2.8  HGB 13.9 14.3  HCT 42.8 43.4  MCV 89.5 89.3  PLT 249 251   Cardiac Enzymes:  Lab 03/20/12 0252 03/19/12 2223  CKTOTAL -- --  CKMB -- --  CKMBINDEX -- --  TROPONINI <0.30 <0.30   BNP (last 3 results) No results found for this basename: PROBNP:3 in the last 8760 hours CBG:  Lab 03/20/12 0715 03/20/12 0217  GLUCAP 197* 147*    No results found for this or any previous visit (from the past 240 hour(s)).   Studies: Dg Chest 2 View  03/19/2012  *RADIOLOGY REPORT*  Clinical Data: Rigors.  Chest pain.  Diabetes.  CHEST - 2 VIEW  Comparison: 03/18/2009.  Findings: Right axillary dissection clips are noted.  The cardiopericardial silhouette appears within normal limits.  No airspace disease.  No effusion.  Trachea midline.  IMPRESSION: No acute cardiopulmonary disease.  No interval change.   Original Report Authenticated By: Dereck Ligas, M.D.     Scheduled Meds:   . [COMPLETED] aspirin  324 mg Oral Once  . aspirin EC  325 mg Oral Daily  . enoxaparin (LOVENOX) injection  40 mg Subcutaneous Q24H  . insulin aspart  0-9 Units Subcutaneous TID WC  . insulin glargine  100 Units Subcutaneous QHS  . levothyroxine  100 mcg Oral QAC breakfast  . losartan  50 mg  Oral Daily  . [COMPLETED]  morphine injection  4 mg Intravenous Once  . nitroGLYCERIN  0.4 mg Transdermal Daily  . [COMPLETED] ondansetron  4 mg Intravenous Once  . OxyCODONE  15 mg Oral Q12H  . sodium chloride  3 mL Intravenous Q12H   Continuous Infusions:   . sodium chloride      Principal Problem:  *Chest pain Active Problems:  HYPOTHYROIDISM  DIABETES MELLITUS, TYPE II  HYPERTENSION    Time spent: 30 minutes    [facility]  If 8PM-8AM, please contact night-coverage at [email or web address], password [facility] 03/20/2012, 8:15 AM  LOS: 1 day

## 2012-03-20 NOTE — ED Provider Notes (Signed)
History     CSN: [id number]  Arrival date & time 03/19/12  2150   First MD Initiated Contact with Patient 03/19/12 2323      Chief Complaint  Patient presents with  . Chest Pain    (Consider location/radiation/quality/duration/timing/severity/associated sxs/prior treatment) Patient is a 61 y.o. female presenting with chest pain. The history is provided by the patient.  Chest Pain The chest pain began 2 days ago. Duration of episode(s) is 1 hour. Chest pain occurs intermittently. The chest pain is improving. Associated with: started while sitting at the computer. At its most intense, the pain is at 7/10. The pain is currently at 1/10. The severity of the pain is mild. The quality of the pain is described as aching, dull and pressure-like. The pain radiates to the left shoulder. Exacerbated by: nothing. Primary symptoms include shortness of breath and cough. Pertinent negatives for primary symptoms include no fever, no wheezing, no palpitations, no abdominal pain, no nausea, no vomiting and no dizziness.  Pertinent negatives for associated symptoms include no weakness. She tried nitroglycerin for the symptoms.  Her past medical history is significant for diabetes, hyperlipidemia and hypertension.  Pertinent negatives for past medical history include no CAD and no MI.  Procedure history is positive for cardiac catheterization.     Past Medical History  Diagnosis Date  . GERD (gastroesophageal reflux disease)   . Hypertension   . Breast cancer   . Diabetes mellitus     Type II  . Headache   . Hypothyroidism   . Osteoarthritis   . Peripheral neuropathy   . Cardiomyopathy   . Chest pain 06/2005    Hospitalized, dystolic dysfunction  . Kidney stones   . Sciatica     Past Surgical History  Procedure Date  . Abdominal hysterectomy 1993  . Total knee arthroplasty 2000    Right  . Mastectomy 12/2003  . Thyroidectomy 1977    Right  . Neck surgery 2001  . Shoulder surgery 2003      Left  . Lymph node removal 2003    Squamous cell CA  . Cardiac catheterization ? 2005 / 2007  . Adenosin cardiolite 07/2005    (+) wall motion abnormality  . Esophagogastroduodenoscopy 2005    GERD  . [place] of abdomen 07/2005    Fatty liver / kidney stones  . Ct abdomen and pelvis 02/2010    Same    Family History  Problem Relation Age of Onset  . Hypertension Mother   . Diabetes Father   . Hypertension Father   . Hypertension Sister   . Diabetes Other     Family Hx. of Diabetes  . Hypertension Other     Family History of HTN  . Hypertension Brother   . Hypertension Brother   . Diabetes Brother     History  Substance Use Topics  . Smoking status: Never Smoker   . Smokeless tobacco: Not on file  . Alcohol Use: No    OB History    Grav Para Term Preterm Abortions TAB SAB Ect Mult Living                  Review of Systems  Constitutional: Negative for fever.  Respiratory: Positive for cough and shortness of breath. Negative for wheezing.   Cardiovascular: Positive for chest pain. Negative for palpitations.  Gastrointestinal: Negative for nausea, vomiting and abdominal pain.  Neurological: Negative for dizziness and weakness.  All other systems reviewed and are negative.  Allergies  Dilaudid; Ampicillin; and Latex  Home Medications   Current Outpatient Rx  Name  Route  Sig  Dispense  Refill  . INSULIN ASPART 100 UNIT/ML [place] SOLN   Subcutaneous   Inject 15 Units into the skin 3 (three) times daily before meals.         . INSULIN GLARGINE 100 UNIT/ML [place] SOLN   Subcutaneous   Inject 100 Units into the skin at bedtime.          [name] Kitchen LEVOTHYROXINE SODIUM 100 MCG PO TABS   Oral   Take 1 tablet (100 mcg total) by mouth daily.   30 tablet   6   . LOSARTAN POTASSIUM-HCTZ 50-12.5 MG PO TABS   Oral   Take 1 tablet by mouth daily.           [name] Kitchen METFORMIN HCL 850 MG PO TABS   Oral   Take 850 mg by mouth 2 (two) times daily with a meal.          .  NITROGLYCERIN 0.4 MG SL SUBL   Sublingual   Place 0.4 mg under the tongue every 5 (five) minutes as needed.           . OXYCODONE HCL ER 15 MG PO T12A   Oral   Take 1 tablet (15 mg total) by mouth every 12 (twelve) hours.   20 tablet   0   . OXYCODONE-ACETAMINOPHEN 5-325 MG PO TABS   Oral   Take 1 tablet by mouth every 4 (four) hours as needed. For pain           BP 138/80  Pulse 82  Temp 98.4 F (36.9 C) (Oral)  Resp 16  SpO2 99%  Physical Exam  Nursing note and vitals reviewed. Constitutional: She is oriented to person, place, and time. She appears well-developed and well-nourished. No distress.  HENT:  Head: Normocephalic and atraumatic.  Mouth/Throat: Oropharynx is clear and moist.  Eyes: Conjunctivae normal and EOM are normal. Pupils are equal, round, and reactive to light.  Neck: Normal range of motion. Neck supple.  Cardiovascular: Normal rate, regular rhythm and intact distal pulses.   No murmur heard. Pulmonary/Chest: Effort normal and breath sounds normal. No respiratory distress. She has no wheezes. She has no rales. She exhibits tenderness.    Abdominal: Soft. She exhibits no distension. There is no tenderness. There is no rebound and no guarding.  Musculoskeletal: Normal range of motion. She exhibits no edema and no tenderness.  Neurological: She is alert and oriented to person, place, and time. She has normal strength. No sensory deficit.  Skin: Skin is warm and dry. No rash noted. No erythema.  Psychiatric: She has a normal mood and affect. Her behavior is normal.    ED Course  Procedures (including critical care time)  Labs Reviewed  COMPREHENSIVE METABOLIC PANEL - Abnormal; Notable for the following:    Glucose, Bld 187 (*)     Calcium 10.9 (*)     All other components within normal limits  CBC WITH DIFFERENTIAL  TROPONIN I   Dg Chest 2 View  03/19/2012  *RADIOLOGY REPORT*  Clinical Data: Rigors.  Chest pain.  Diabetes.  CHEST - 2 VIEW   Comparison: 03/18/2009.  Findings: Right axillary dissection clips are noted.  The cardiopericardial silhouette appears within normal limits.  No airspace disease.  No effusion.  Trachea midline.  IMPRESSION: No acute cardiopulmonary disease.  No interval change.   Original Report Authenticated By: Cay Schillings  Lamke, M.D.      1. Chest pain at rest       MDM   Patient with 2 episodes of chest pain yesterday and today that were not relieved with nitroglycerin. Associated symptoms included shortness of breath, diaphoresis. This did not happen after a meal and was not related to blood sugar. She has a history of diabetes, hypertension, cardiomyopathy and prior history of cancer status post treatment with chemotherapy and remission.  No prior history of PE and symptoms today are not suggestive of PE. Patient was given morphine and is now chest pain-free. She had a catheterization done in 2005 or 2006 with no stents placed. EKG is within normal limits. Chest x-ray is within normal limits. CBC, BMP, troponin are all within normal limits. Will admit patient for cardiac rule out and stress test in a.m.  Was not a candidate for chest pain comments because she is unable to walk on a treadmill.       Blanchie Dessert, MD 03/20/12 [phone]

## 2012-03-20 NOTE — Discharge Summary (Signed)
Physician Discharge Summary  [name] [id number] DOB: January 16, 1951 DOA: 03/19/2012  PCP: Odette Fraction, MD  Admit date: 03/19/2012 Discharge date: 03/20/2012  Time spent: 40 minutes  Recommendations for Outpatient Follow-up:  1. Follow up with PCP in 7-10 days for review of CBG's and evaluation of insulin dosage for optimal DM control 2. Has appointment with Dr. 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 Evening while sitting in front of the computer patient started developing chest pain and mild shortness of breath. She took some nitroglycerin twice sublingual, which improved her discomfort. Patient was brought to the ER. Chest x-ray EKG and cardiac enzymes were unremarkable. Patient was given one dose of morphine in ED for discomfort. Patient admitted for further management.  discomfort.      Hospital Course:  #1.Atypical chest pain. ACS ruled out. CE neg. Tele without event, EKG non-ischemic,  d-dimer elevated and CT angio chest neg for PE.  Chest xray negative for cardiopulmonary disease. Normal coronaries by cath 2007. Echo with EF 55-60%.Pt given nitroglycerin paste, aspirin and home med continued. She was seen by cardiology.  #2. Mild hypercalcemia - patient states she is on vitamin D supplementation. Her charts mention she has had hyperparathyroidism which  patient does not recall. PTH and vitamin D levels pending and will need to followed up on on an OP basis. Calcium level trending down slightly. HCTZ inially held due to hypercalcemia Only mildly hypercalemic so will resume on discharge. Patient does have history of squamous cell carcinoma of unknown primary which has per patient has been under remission and her calcium level status to be closely followed.  #3. Diabetes mellitus type 2 -uncontrolled. A1C 12 last year.  A1c pending. Will need OP follow up. CBG range during this hospitalization 145-210. Seen by diabetic coordinator for further education. .  #4. Hypertension - Fair control. continue home medications.  #5. Hypothyroidism - TSH pending at discharge. Recommend follow up OP basis. Continue  Synthroid at discharge.  #6. History of squamous cell carcinoma of unknown primary under remission per patient      Procedures:    Consultations:  Cardiology  Discharge Exam: Filed Vitals:   03/20/12 0100 03/20/12 0200 03/20/12 0200 03/20/12 0230  BP: 121/57  120/74 136/73  Pulse: 76 95  79  Temp:    98.7 F (37.1 C)  TempSrc:    Oral  Resp:      Height:    5\' 5"  (1.651 m)  Weight:    88.315 kg (194 lb 11.2 oz)  SpO2: 95% 94%  98%    General: awake alert well nourished  Cardiovascular: RRR No MGR trace LEE PPP Respiratory: normal effort BSCTAB No wheeze, rhochi  Discharge Instructions  Discharge Orders    Future Appointments: Provider: Department: Dept Phone: Center:   03/26/2012 11:00 AM Thayer Headings, MD [place]) [phone] [id number]     Future Orders Please Complete  By Expires   Diet - low sodium heart healthy      Increase activity slowly      Discharge instructions      Comments:   Keep a log of CBG's and take to PCP in 7-10 days.   Call MD for:  persistant nausea and vomiting      Call MD for:  difficulty breathing, headache or visual disturbances      Call MD for:  persistant  dizziness or light-headedness          Medication List     As of 03/20/2012  1:21 PM    TAKE these medications         insulin aspart 100 UNIT/ML injection   Commonly known as: novoLOG   Inject 15 Units into the skin 3 (three) times daily before meals.      insulin glargine 100 UNIT/ML injection   Commonly known as: LANTUS   Inject 100 Units into the skin at bedtime.      levothyroxine 100 MCG tablet   Commonly known as: SYNTHROID, LEVOTHROID   Take 1 tablet (100 mcg total) by mouth daily.      losartan-hydrochlorothiazide 50-12.5 MG per tablet   Commonly known as: HYZAAR   Take 1 tablet by mouth daily.      metFORMIN 850 MG tablet   Commonly known as: GLUCOPHAGE   Take 850 mg by mouth 2 (two) times daily with a meal.      nitroGLYCERIN 0.4 MG SL tablet   Commonly known as: NITROSTAT   Place 0.4 mg under the tongue every 5 (five) minutes as needed.      OxyCODONE 15 mg T12a   Commonly known as: OXYCONTIN   Take 1 tablet (15 mg total) by mouth every 12 (twelve) hours.      oxyCODONE-acetaminophen 5-325 MG per tablet   Commonly known as: PERCOCET/ROXICET   Take 1 tablet by mouth every 4 (four) hours as needed. For pain           Follow-up Information    Follow up with [facility] TOM, MD. Schedule an appointment as soon as possible for a visit in 7 days. (review CBG journal for DM control. )    Contact information:   [place] [address] [phone]       Follow up with [facility] E, MD. (has appointment 03/26/12)    Contact information:   [place] [address] [phone]           The results of significant diagnostics from this hospitalization (including imaging, microbiology, ancillary and laboratory) are listed below for reference.    Significant Diagnostic Studies: Dg Chest 2 View  03/19/2012  *RADIOLOGY REPORT*  Clinical Data: Rigors.  Chest pain.  Diabetes.  CHEST - 2 VIEW  Comparison: 03/18/2009.   Findings: Right axillary dissection clips are noted.  The cardiopericardial silhouette appears within normal limits.  No airspace disease.  No effusion.  Trachea midline.  IMPRESSION: No acute cardiopulmonary disease.  No interval change.   Original Report Authenticated By: Dereck Ligas, M.D.    Ct Angio Chest Pe W/cm &/or Wo Cm  03/20/2012  *RADIOLOGY REPORT*  Clinical Data: Chest pain and shortness of breath  CT ANGIOGRAPHY CHEST  Technique:  Multidetector CT imaging of the chest using the standard protocol during bolus administration of intravenous contrast. Multiplanar reconstructed images including MIPs were obtained and reviewed to evaluate the vascular anatomy.  Contrast: 114mL  OMNIPAQUE IOHEXOL 350 MG/ML SOLN  Comparison: 04/29/2008  Findings:  There is no pleural effusion.  No airspace consolidation identified.  No suspicious pulmonary parenchymal nodule or mass noted.  Right paratracheal lymph node measures 9 mm, image 23.  Heart size is normal.  No pericardial effusion.  Main PA measures 4 cm in diameter, image 38.  This is increased in caliber.  There are no abnormal filling defects identified within the main pulmonary artery or its branches to suggest an acute pulmonary embolus. Imaging through the upper abdomen is unremarkable.  Mild spondylosis noted within the thoracic spine.  IMPRESSION:  1.  No acute findings identified.  No evidence for pulmonary embolus. 2.  Increased caliber of the main pulmonary artery which suggest PA hypertension.   Original Report Authenticated By: Kerby Moors, M.D.     Microbiology: No results found for this or any previous visit (from the past 240 hour(s)).   Labs: Basic Metabolic Panel:  Lab [id number] 0253 03/19/12 2216  NA 138 138  K 3.8 4.0  CL 102 100  CO2 27 27  GLUCOSE 145* 187*  BUN 13 13  CREATININE 0.57 0.60  CALCIUM 10.6* 10.9*  MG -- --  PHOS -- --   Liver Function Tests:  Lab 03/19/12 2216  AST 22  ALT 20  ALKPHOS 64  BILITOT  0.5  PROT 7.9  ALBUMIN 3.8   No results found for this basename: LIPASE:5,AMYLASE:5 in the last 168 hours No results found for this basename: AMMONIA:5 in the last 168 hours CBC:  Lab 03/20/12 0253 03/19/12 2216  WBC 6.3 6.2  NEUTROABS -- 2.8  HGB 13.9 14.3  HCT 42.8 43.4  MCV 89.5 89.3  PLT 249 251   Cardiac Enzymes:  Lab 03/20/12 0906 03/20/12 0252 03/19/12 2223  CKTOTAL -- -- --  CKMB -- -- --  CKMBINDEX -- -- --  TROPONINI <0.30 <0.30 <0.30   BNP: BNP (last 3 results) No results found for this basename: PROBNP:3 in the last 8760 hours CBG:  Lab 03/20/12 1123 03/20/12 0715 03/20/12 0217  GLUCAP 218* 197* 147*       Signed:  Dyanne Carrel M  [facility] 03/20/2012, 1:21 PM

## 2012-03-20 NOTE — Discharge Summary (Signed)
Patient seen and examined. Chest pain free and ruled out for ACS. Echo unremarkable. Stable for d/c home with outpt follow up

## 2012-03-20 NOTE — Progress Notes (Signed)
Inpatient Diabetes Program Recommendations  AACE/ADA: New Consensus Statement on Inpatient Glycemic Control (2013)  Target Ranges:  Prepandial:   less than 140 mg/dL      Peak postprandial:   less than 180 mg/dL (1-2 hours)      Critically ill patients:  140 - 180 mg/dL   Reason for Visit: Uncontrolled Dm - Questions regarding steroids 61 year-old female history of hypertension, diabetes mellitus type 2, hypothyroidism and history of squamosal carcinoma of unknown primary and had required right-sided mastectomy with chemotherapy presented to ED on 03/19/12 with complaints of chest pain. Evening while sitting in front of the computer patient started developing chest pain and mild shortness of breath. She took some nitroglycerin twice sublingual, which improved her discomfort. Patient was brought to the ER. Chest x-ray EKG and cardiac enzymes were unremarkable. Patient was given one dose of morphine in ED for discomfort. Patient admitted for further management. Discomfort.  Pt reports being on steroids for back pain over past two weeks.  Checks blood sugars at home 1 - 3 times per day.  Has tried to be careful with diet and increased physical activity by walking more in the house.  Has been to OP Diabetes Education classes and follows up with PCP for diabetes management.    Discussion with pt regarding steroids increasing blood sugars. Also discussed hypoglycemia treatment, blood glucose monitoring, diet and exercise with regards to diabetes.  Instructed pt to f/u with PCP and report blood sugars for adjustments by MD.  Answered questions and gave report to RN.  Pt to f/u with PCP in next few weeks.  Will need updated HgbA1C since last one was on 10/14/2010 - 13.5%.

## 2012-03-20 NOTE — Progress Notes (Signed)
Utilization review completed.  

## 2012-03-20 NOTE — Consult Note (Signed)
CARDIOLOGY CONSULT NOTE  Patient ID: [name], MRN: [id number], DOB/AGE: [birth year] 61 y.o. Admit date: 03/19/2012 Date of Consult: 03/20/2012  Primary Physician: Desiree Fraction, MD Primary Cardiologist: [name]  Chief Complaint: chest pain Reason for Consultation: chest pain  HPI: 61 y.o. female w/ PMHx significant for normal coronaries by cath '02/'07, NICM (EF 40% by cath '07, ? 2/2 chemo, EF 55% by repeat echo '07), chronic atypical chest pain, HTN, DMII, Hypothyroidism, Chronic back pain, GERD, and breast CA (s/p Right mastectomy w/ chemo '05) who presented to [facility] on 03/19/2012 with complaints of chest pain.  Cath 2002 (after anteroapical ischemia noted on stress test) showed normal coronary arteries and LV function. Right/Left Cath 2007 (after stress showed apical defect and decreased LV function) showed normal coronary arteries, global hypokinesis, EF 40%. It was suspected she had NICM r/t chemotherapy. She followed up with DesireeNishan where it was noted she continued to have atypical chest pain that was noncardiac in etiology. Repeat Echo in 2007 showed EF 55%, mild inferior [name] hypokinesis, no significant valvular abnormalities. She reports having occasional chest pain over the past couple of years, but this has increased in frequency over the last couple of months. She describes the chest pain as a substernal chest tightness that moves into her back. It is sometimes preceded by or followed by chills, diaphoresis, nausea, and sob. It can occur at rest or with exertion. No relation to food intake. Worse with deep breath. She has not been very active over the last 6 months due to back pain with radiation down her right leg. Prior to this she was able to walk and do yard work without chest pain. She has had a cough that is worse at night, nonproductive. Sleeps on three pillows, but states this is due to back pain, no sob. Denies DOE, orthopnea, weight gain, LE edema,  palpitations, syncope, or fever. She was scheduled to see Dr. Acie Fredrickson on 12/16 for preop clearance for lumbar surgery, however, she presented to the ED last night with complaints of chest pain.   EKG shows NSR 86bpm, LAD, no acute ST/T changes. CXR is without acute cardiopulmonary findings. Labs are significant for normal troponin x2, DDimer 0.93, glucose 187, Ca 10.9, otherwise unremarkable CMET/CBC. She was admitted by the medicine service for further evaluation. Tele shows NSR without arrhythmias. Due to elevated DDimer a CTA chest has been ordered. Echo is pending. She had recurrent chest pain this morning while walking around in her room that was relieved with rest.   Past Medical History  Diagnosis Date  . GERD (gastroesophageal reflux disease)   . Hypertension   . Hypothyroidism   . Peripheral neuropathy   . Cardiomyopathy   . Chest pain 06/2005    Hospitalized, dystolic dysfunction  . Sciatica   . Type II diabetes mellitus   . PONV (postoperative nausea and vomiting)   . Anginal pain 03/19/2012  . H/O hiatal hernia   . Migraines     "it's been a long time since I've had one" (03/20/2012)  . Osteoarthritis   . Arthritis     "all over; mostly in my back" (03/20/2012)  . Chronic lower back pain     "have 2 herniated discs; going to have to have a fusion" (03/20/2012)  . Kidney stones     "never had OR" (03/20/2012)  . Breast cancer     "right" (03/20/2012)     2007 Cath Right heart catheterization was done due to  the patient's dyspnea and decreased LV function. Mean right atrial pressure was 11, RV pressure was 36/10, PA pressure was 36 over 15. Mean pulmonary capillary wedge pressure was 16. Aortic pressures 122/64, LV pressure is 124/70. In short, the patient had reasonable filling pressures with no evidence of pulmonary hypertension.  CORONARY ARTERIOGRAPHY:  Left main coronary is normal.  Left anterior descending artery is normal.  Circumflex coronary artery is normal.    Right coronary was dominant and normal.  RAO ventriculography showed global hypokinesis with a mild to moderate left ventricular cavity and ejection Pratt was in the 40% range. There is mild mitral regurgitation.  IMPRESSION: The patient appears to have a nonischemic cardiomyopathy. This is most likely related to her chemotherapy for breast cancer. Apparently this is still being worked up. In regards to any further therapy if she does need further therapy of this will have to be factored into it. She would not be a candidate for her septum. It would be nice to get her Port-A-Cath out of her breast cancer does not require any further adjuvant therapy. We will see the patient back in the office in follow-up. I suspect that given the fact that she is diabetic she would probably benefit from starting an ARB with possibly low dose hydrochlorothiazide. Since she did just get contrast and is on metformin, we will wait until adding this when I see her back in the office. Aside from treating her with an ARB and a diuretic, I do not think further therapy is warranted and she will have follow-up MRI or MUGA scan to assess LV function in 6 months.  2007 Echo LEFT VENTRICLE: - There is mild relative hypokinesis of the inferior [name]. - Left ventricular size was normal. - Left ventricular ejection Pratt was estimated to be 55 %. - Left ventricular [name] thickness was normal. AORTIC VALVE: - The aortic valve was trileaflet. - Aortic valve thickness was normal. - There was normal aortic valve leaflet excursion. Doppler interpretation(s): - Transaortic velocity was within the normal range. - There was no evidence for aortic valve stenosis. - There was no significant aortic valvular regurgitation. AORTA: - The aortic root was normal in size. MITRAL VALVE: - The mitral valve was grossly normal. Doppler interpretation(s): - There was trivial mitral valvular regurgitation. LEFT ATRIUM: - Left atrial size  was normal. RIGHT VENTRICLE: - The right ventricle was grossly normal. PULMONIC VALVE: - The pulmonic valve was not well visualized. TRICUSPID VALVE: - The tricuspid valve structure was normal. - Tricuspid leaflet excursion was normal. Doppler interpretation(s): - The transtricuspid velocity was within the normal range. - There was no evidence for tricuspid stenosis. - There was no significant tricuspid valvular regurgitation. RIGHT ATRIUM: - Right atrial size was normal. SYSTEMIC VEINS: - The inferior vena cava was normal. PERICARDIUM: - There was no pericardial effusion. --------------------------------------------------------------- SUMMARY - There is mild relative hypokinesis of the inferior [name]. Left ventricular ejection Pratt was estimated to be 55 %.   Surgical History:  Past Surgical History  Procedure Date  . Abdominal hysterectomy 1993  . Total knee arthroplasty 2000    Right  . Mastectomy with axillary lymph node dissection 12/2003    "right" (03/20/2012)  . Thyroidectomy 1977    Right  . Posterior cervical fusion/foraminotomy 2001  . Shoulder arthroscopy w/ rotator cuff repair 2003    Left  . Cardiac catheterization ? 2005 / 2007  . Adenosin cardiolite 07/2005    (+) [name] motion abnormality  . Esophagogastroduodenoscopy  2005    GERD  . [place] of abdomen 07/2005    Fatty liver / kidney stones  . Ct abdomen and pelvis 02/2010    Same  . Knee arthroscopy 1990's    "right" (03/20/2012)  . Axillary lymph node dissection 2003    squamous cell cancer     Home Meds: Medication Sig  insulin aspart (NOVOLOG) 100 UNIT/ML injection Inject 15 Units into the skin 3 (three) times daily before meals.  insulin glargine (LANTUS) 100 UNIT/ML injection Inject 100 Units into the skin at bedtime.   levothyroxine (SYNTHROID, LEVOTHROID) 100 MCG tablet Take 1 tablet (100 mcg total) by mouth daily.  losartan-hydrochlorothiazide (HYZAAR) 50-12.5 MG per tablet Take 1 tablet  by mouth daily.    metFORMIN (GLUCOPHAGE) 850 MG tablet Take 850 mg by mouth 2 (two) times daily with a meal.   nitroGLYCERIN (NITROSTAT) 0.4 MG SL tablet Place 0.4 mg under the tongue every 5 (five) minutes as needed.    OxyCODONE (OXYCONTIN) 15 mg TB12 Take 1 tablet (15 mg total) by mouth every 12 (twelve) hours.  oxyCODONE-acetaminophen (PERCOCET/ROXICET) 5-325 MG per tablet Take 1 tablet by mouth every 4 (four) hours as needed. For pain    Inpatient Medications:   . [COMPLETED] aspirin  324 mg Oral Once  . aspirin EC  325 mg Oral Daily  . enoxaparin (LOVENOX) injection  40 mg Subcutaneous Q24H  . insulin aspart  0-9 Units Subcutaneous TID WC  . insulin glargine  100 Units Subcutaneous QHS  . levothyroxine  100 mcg Oral QAC breakfast  . losartan  50 mg Oral Daily  . [COMPLETED]  morphine injection  4 mg Intravenous Once  . nitroGLYCERIN  0.4 mg Transdermal Daily  . [COMPLETED] ondansetron  4 mg Intravenous Once  . OxyCODONE  15 mg Oral Q12H  . sodium chloride  3 mL Intravenous Q12H     Allergies:  Allergies  Allergen Reactions  . Dilaudid (Hydromorphone Hcl) Nausea And Vomiting  . Ampicillin Rash    REACTION: Rash and welts on her hands when she took it in the hospital  . Latex Itching, Dermatitis and Rash    REACTION: Patient had a reaction to tape after surgery and they told her she was allergic to Latex 03/20/2012 "if it's on too long it will break me out"    History   Social History  . Marital Status: Married    Spouse Name: N/A    Number of Children: 1  . Years of Education: N/A   Occupational History  . Retired since 2003    Social History Main Topics  . Smoking status: Never Smoker   . Smokeless tobacco: Never Used  . Alcohol Use: No  . Drug Use: No  . Sexually Active: Not Currently   Other Topics Concern  . Not on file   Social History Narrative  . No narrative on file     Family History  Problem Relation Age of Onset  . Hypertension Mother   .  Diabetes Father   . Hypertension Father   . Hypertension Sister   . Diabetes Other     Family Hx. of Diabetes  . Hypertension Other     Family History of HTN  . Hypertension Brother   . Hypertension Brother   . Diabetes Brother      Review of Systems: General: As per HPI Cardiovascular: As per HPI Dermatological: negative for rash Respiratory: As per HPI Urologic: negative for hematuria Abdominal: (+)nausea, vomiting; negative  for diarrhea, bright red blood per rectum, melena, or hematemesis Neurologic: negative for visual changes, syncope, or dizziness All other systems reviewed and are otherwise negative except as noted above.  Labs:  [facility] 03/20/12 0252 03/19/12 2223  TROPONINI <0.30 <0.30   Component Value Date   WBC 6.3 03/20/2012   HGB 13.9 03/20/2012   HCT 42.8 03/20/2012   MCV 89.5 03/20/2012   PLT 249 03/20/2012    Lab 03/20/12 0253 03/19/12 2216  NA 138 --  K 3.8 --  CL 102 --  CO2 27 --  BUN 13 --  CREATININE 0.57 --  CALCIUM 10.6* --  PROT -- 7.9  BILITOT -- 0.5  ALKPHOS -- 64  ALT -- 20  AST -- 22  GLUCOSE 145* --   Component Value Date   CHOL 160 03/20/2012   HDL 32* 03/20/2012   LDLCALC 89 03/20/2012   TRIG 193* 03/20/2012   Component Value Date   DDIMER 0.93* 03/20/2012    Radiology/Studies:   03/19/2012 - CHEST - 2 VIEW Findings: Right axillary dissection clips are noted.  The cardiopericardial silhouette appears within normal limits.  No airspace disease.  No effusion.  Trachea midline.  IMPRESSION: No acute cardiopulmonary disease.  No interval change.      EKG: 03/19/12 @ 2158 -  NSR 86bpm, LAD, no acute ST/T changes  Physical Exam: Blood pressure 136/73, pulse 79, temperature 98.7 F (37.1 C), temperature source Oral, resp. rate 16, height 5\' 5"  ([id number] m), weight 194 lb 11.2 oz (88.315 kg), SpO2 98.00%. General: Pleasant overweight black female in no acute distress. Head: Normocephalic, atraumatic, sclera non-icteric, no  xanthomas, nares are without discharge.  Neck: Supple. Negative for carotid bruits or JVD Lungs: Clear bilaterally to auscultation without wheezes, rales, or rhonchi. Breathing is unlabored. Heart: RRR with S1 S2. No murmurs, rubs, or gallops appreciated. Abdomen: Soft, non-tender, non-distended with normoactive bowel sounds. No rebound/guarding. No obvious abdominal masses. Msk:  Strength and tone appear normal for age. Extremities: No clubbing or cyanosis. No edema.  Distal pedal pulses are intact and equal bilaterally. Neuro: Alert and oriented X 3. Moves all extremities spontaneously. Psych:  Responds to questions appropriately with a normal affect.   Assessment and Plan:  61 y.o. female w/ PMHx significant for normal coronaries by cath '02/'07, NICM (EF 40% by cath '07, ? 2/2 chemo, EF 55% by repeat echo '07), chronic atypical chest pain, HTN, DMII, Hypothyroidism, Chronic back pain, GERD, and breast CA (s/p Right mastectomy w/ chemo '05) who presented to [facility] on 03/19/2012 with complaints of chest pain.  1. Atypical chest pain 2. Elevated DDimer 3. Normal coronaries by cath '02/'07 4. H/o NICM ? 2/2 chemo EF 40%-->55% '07 5. Hypertension 6. Diabetes Mellitus Type 2 7. Hypothyroidism 8. Chronic back pain with plans for lumbar surgery 9. GERD 10. H/o Breast CA s/p right mastectomy/chemo '05  Patient presents with chest pain that has increased in frequency over the last 1-2 months. It is atypical in nature, enzymes normal and EKG nonischemic. She has had two abnormal stress tests in the past with subsequent normal cardiac caths x2. With elevated DDimer agree with CTA chest to r/o PE or other acute causes of chest pain. Check echo to assess EF and Talan Gildner motion. If unremarkable does not need any further cardiac work up and would be ok for lumbar surgery. BP ok on losartan.  Signed, [name], JESSICA PA-C 03/20/2012, 8:10 AM   I have taken a history, reviewed medications,  allergies, PMH, SH, FH, and reviewed ROS and examined the patient.  I agree with the assessment and plan. Her CP is chronic and atypical. As a consequence, she has been exposed with a lot of radiation. With her history of previous cancer, I suspect her risk of a future oncology issue is even higher. If Echo is stable, and the ordered CT is negative, I would not order any further studies to evaluate her CP. She is cleared for surgery with Dr [name].I have discussed all of this with her.  Desiree Gossard C. Verl Blalock, MD, [place] Pager:  [phone]

## 2012-03-20 NOTE — ED Notes (Signed)
Cardiologist at bedside.  

## 2012-03-20 NOTE — Progress Notes (Signed)
Pt provided with dc instructions and education. Pt verbalized understanding. Pt spoke with diabetes coordinator and had all questions answered. Pt aware to keep a close journal of CBG with insulin regimen and report to MD as scheduled. Pt has no questions at this time. IV removed with tip intact. Heart monitor cleaned and returned to front. Pt awaiting daughter to pick her up for DC home. Harnoor Kohles,RN

## 2012-03-20 NOTE — H&P (Signed)
[name] is an 61 y.o. female.   Patient was seen and examined on March 20, 2012. PCP -Dr. Dennard Schaumann of [facility] family practice. Chief Complaint: Chest pain.  HPI:61 year-old female history of hypertension, diabetes mellitus type 2, hypothyroidism and history of squamosal carcinoma of unknown primary and had required right-sided mastectomy with chemotherapy presents with complaints of chest pain. Last evening while sitting in front of the computer patient started developing chest pain and mild shortness of breath. She took some nitroglycerin twice sublingual, which improved her discomfort. Patient was brought to the ER. Chest x-ray EKG and cardiac enzymes were unremarkable. Patient was given one dose of morphine today for discomfort. At this time patient has been admitted for further management. Patient still has mild chest discomfort.  Patient is to see Dr. Acie Fredrickson cardiologist this week for possible stress test as patient is supposed to have low back surgery for chronic low back pain.   Past Medical History  Diagnosis Date  . GERD (gastroesophageal reflux disease)   . Hypertension   . Hypothyroidism   . Peripheral neuropathy   . Cardiomyopathy   . Chest pain 06/2005    Hospitalized, dystolic dysfunction  . Sciatica   . Type II diabetes mellitus   . PONV (postoperative nausea and vomiting)   . Anginal pain 03/19/2012  . H/O hiatal hernia   . Migraines     "it's been a long time since I've had one" (03/20/2012)  . Osteoarthritis   . Arthritis     "all over; mostly in my back" (03/20/2012)  . Chronic lower back pain     "have 2 herniated discs; going to have to have a fusion" (03/20/2012)  . Kidney stones     "never had OR" (03/20/2012)  . Breast cancer     "right" (03/20/2012)    Past Surgical History  Procedure Date  . Abdominal hysterectomy 1993  . Total knee arthroplasty 2000    Right  . Mastectomy with axillary lymph node dissection 12/2003    "right"  (03/20/2012)  . Thyroidectomy 1977    Right  . Posterior cervical fusion/foraminotomy 2001  . Shoulder arthroscopy w/ rotator cuff repair 2003    Left  . Cardiac catheterization ? 2005 / 2007  . Adenosin cardiolite 07/2005    (+) wall motion abnormality  . Esophagogastroduodenoscopy 2005    GERD  . [place] of abdomen 07/2005    Fatty liver / kidney stones  . Ct abdomen and pelvis 02/2010    Same  . Knee arthroscopy 1990's    "right" (03/20/2012)  . Axillary lymph node dissection 2003    squamous cell cancer    Family History  Problem Relation Age of Onset  . Hypertension Mother   . Diabetes Father   . Hypertension Father   . Hypertension Sister   . Diabetes Other     Family Hx. of Diabetes  . Hypertension Other     Family History of HTN  . Hypertension Brother   . Hypertension Brother   . Diabetes Brother    Social History:  reports that she has never smoked. She has never used smokeless tobacco. She reports that she does not drink alcohol or use illicit drugs.  Allergies:  Allergies  Allergen Reactions  . Dilaudid (Hydromorphone Hcl) Nausea And Vomiting  . Ampicillin Rash    REACTION: Rash and welts on her hands when she took it in the hospital  . Latex Itching, Dermatitis and Rash  REACTION: Patient had a reaction to tape after surgery and they told her she was allergic to Latex 03/20/2012 "if it's on too long it will break me out"     (Not in a hospital admission)  Results for orders placed during the hospital encounter of 03/19/12 (from the past 48 hour(s))  CBC WITH DIFFERENTIAL     Status: Normal   Collection Time   03/19/12 10:16 PM      Component Value Range Comment   WBC 6.2  4.0 - 10.5 K/uL    RBC 4.86  3.87 - 5.11 MIL/uL    Hemoglobin 14.3  12.0 - 15.0 g/dL    HCT 43.4  36.0 - 46.0 %    MCV 89.3  78.0 - 100.0 fL    MCH 29.4  26.0 - 34.0 pg    MCHC 32.9  30.0 - 36.0 g/dL    RDW 13.6  11.5 - 15.5 %    Platelets 251  150 - 400 K/uL    Neutrophils  Relative 46  43 - 77 %    Neutro Abs 2.8  1.7 - 7.7 K/uL    Lymphocytes Relative 43  12 - 46 %    Lymphs Abs 2.7  0.7 - 4.0 K/uL    Monocytes Relative 8  3 - 12 %    Monocytes Absolute 0.5  0.1 - 1.0 K/uL    Eosinophils Relative 3  0 - 5 %    Eosinophils Absolute 0.2  0.0 - 0.7 K/uL    Basophils Relative 1  0 - 1 %    Basophils Absolute 0.0  0.0 - 0.1 K/uL   COMPREHENSIVE METABOLIC PANEL     Status: Abnormal   Collection Time   03/19/12 10:16 PM      Component Value Range Comment   Sodium 138  135 - 145 mEq/L    Potassium 4.0  3.5 - 5.1 mEq/L    Chloride 100  96 - 112 mEq/L    CO2 27  19 - 32 mEq/L    Glucose, Bld 187 (*) 70 - 99 mg/dL    BUN 13  6 - 23 mg/dL    Creatinine, Ser 0.60  0.50 - 1.10 mg/dL    Calcium 10.9 (*) 8.4 - 10.5 mg/dL    Total Protein 7.9  6.0 - 8.3 g/dL    Albumin 3.8  3.5 - 5.2 g/dL    AST 22  0 - 37 U/L    ALT 20  0 - 35 U/L    Alkaline Phosphatase 64  39 - 117 U/L    Total Bilirubin 0.5  0.3 - 1.2 mg/dL    GFR calc non Af Amer >90  >90 mL/min    GFR calc Af Amer >90  >90 mL/min   TROPONIN I     Status: Normal   Collection Time   03/19/12 10:23 PM      Component Value Range Comment   Troponin I <0.30  <0.30 ng/mL    Dg Chest 2 View  03/19/2012  *RADIOLOGY REPORT*  Clinical Data: Rigors.  Chest pain.  Diabetes.  CHEST - 2 VIEW  Comparison: 03/18/2009.  Findings: Right axillary dissection clips are noted.  The cardiopericardial silhouette appears within normal limits.  No airspace disease.  No effusion.  Trachea midline.  IMPRESSION: No acute cardiopulmonary disease.  No interval change.   Original Report Authenticated By: Dereck Ligas, M.D.     Review of Systems  Constitutional: Negative.  HENT: Negative.   Eyes: Negative.   Respiratory: Positive for shortness of breath.   Cardiovascular: Positive for chest pain.  Gastrointestinal: Negative.   Genitourinary: Negative.   Musculoskeletal: Negative.   Skin: Negative.   Neurological: Negative.    Endo/Heme/Allergies: Negative.   Psychiatric/Behavioral: Negative.     Blood pressure 121/57, pulse 76, temperature 98.4 F (36.9 C), temperature source Oral, resp. rate 16, SpO2 95.00%. Physical Exam  Constitutional: She is oriented to person, place, and time. She appears well-developed and well-nourished. No distress.  HENT:  Head: Normocephalic and atraumatic.  Right Ear: External ear normal.  Left Ear: External ear normal.  Nose: Nose normal.  Mouth/Throat: Oropharynx is clear and moist. No oropharyngeal exudate.  Eyes: Conjunctivae normal are normal. Pupils are equal, round, and reactive to light. Right eye exhibits no discharge. Left eye exhibits no discharge. No scleral icterus.  Neck: Normal range of motion. Neck supple.  Cardiovascular: Normal rate and regular rhythm.   Respiratory: Effort normal and breath sounds normal. No respiratory distress. She has no wheezes. She has no rales.  GI: Soft. Bowel sounds are normal. She exhibits no distension. There is no tenderness. There is no rebound.  Musculoskeletal: She exhibits no edema and no tenderness.  Neurological: She is alert and oriented to person, place, and time.  Skin: Skin is warm and dry. She is not diaphoretic.     Assessment/Plan #1. Chest pain we'll rule out ACS  - since patient has persistent pain patient will be admitted to step down. Nitroglycerin paste, aspirin, cycle cardiac markers. Check d-dimer. #2. Mild hypercalcemia - patient states she is on vitamin D supplementation. Her charts mention she has had hyperparathyroidism which patient does not recall. At this time have ordered PTH and vitamin D levels and recheck calcium levels in a.m. Holding HCTZ due to hypercalcemia. Patient does have history of squamous cell carcinoma of unknown primary which has per patient has been under remission and her calcium level status to be closely followed. #3. Diabetes mellitus type 2 - continue home medications. #4.  Hypertension - continue home medications except for HCTZ see #2. #5. Hypothyroidism - check TSH continue Synthroid. #6. History of squamous cell carcinoma of unknown primary under remission per patient.  CODE STATUS - full code.  Melecio Cueto N. 03/20/2012, 1:30 AM

## 2012-03-23 LAB — HEMOGLOBIN A1C: Hgb A1c MFr Bld: 11 %

## 2012-03-26 ENCOUNTER — Encounter: Payer: Self-pay | Admitting: Cardiovascular Disease

## 2012-03-26 ENCOUNTER — Encounter (INDEPENDENT_AMBULATORY_CARE_PROVIDER_SITE_OTHER): Payer: Medicare Other | Admitting: Cardiovascular Disease

## 2012-03-26 VITALS — BP 106/68 | HR 77 | Ht 65.0 in | Wt 197.4 lb

## 2012-03-26 DIAGNOSIS — R0789 Other chest pain: Secondary | ICD-10-CM

## 2012-03-26 NOTE — Progress Notes (Signed)
  The patient was scheduled to see me before having back surgery. In the meantime she was admitted to the hospital with episodes of chest pain. She was seen in consultation by Dr. wall.  She has a history of chest pains over the past 10 years. She's had 2 normal heart catheterizations.  During her hospitalization she ruled out for myocardial infarction. She had an echocardiogram that revealed normal left ventricle systolic function.   Her echocardiogram was normal.  Dr. wall was already given cardiac clearance for her upcoming back surgery. She should be at low risk for upcoming surgery given the normal left ventricular systolic function by echo.  We will see her on as-needed basis.  I suggested that she try Mylanta or Maalox when she has another episode of chest pain.

## 2012-03-26 NOTE — Patient Instructions (Addendum)
Good luck with surgery!

## 2012-03-28 LAB — PARATHYROID HORMONE, INTACT (NO CA)

## 2012-04-10 ENCOUNTER — Other Ambulatory Visit (HOSPITAL_COMMUNITY): Payer: Self-pay | Admitting: Specialist

## 2012-04-13 ENCOUNTER — Encounter (HOSPITAL_COMMUNITY): Payer: Self-pay | Admitting: Pharmacy Technician

## 2012-04-16 ENCOUNTER — Encounter (HOSPITAL_COMMUNITY): Payer: Self-pay

## 2012-04-16 ENCOUNTER — Encounter (HOSPITAL_COMMUNITY)
Admission: RE | Admit: 2012-04-16 | Discharge: 2012-04-16 | Disposition: A | Discharge status: Home / Self Care | Payer: Medicare Other | Source: Ambulatory Visit | Attending: Specialist | Admitting: Specialist

## 2012-04-16 HISTORY — DX: Irritable bowel syndrome, unspecified: K58.9

## 2012-04-16 HISTORY — DX: Family history of other specified conditions: Z84.89

## 2012-04-16 LAB — BASIC METABOLIC PANEL
BUN: 19 mg/dL (ref 6–23)
CO2: 29 mEq/L (ref 19–32)
Calcium: 11.2 mg/dL — ABNORMAL HIGH (ref 8.4–10.5)
Chloride: 101 mEq/L (ref 96–112)
Creatinine, Ser: 0.77 mg/dL (ref 0.50–1.10)

## 2012-04-16 LAB — CBC
HCT: 41.5 % (ref 36.0–46.0)
MCHC: 33.7 g/dL (ref 30.0–36.0)
MCV: 88.7 fL (ref 78.0–100.0)
Platelets: 261 10*3/uL (ref 150–400)
RDW: 13.5 % (ref 11.5–15.5)

## 2012-04-16 LAB — URINALYSIS, ROUTINE W REFLEX MICROSCOPIC
Glucose, UA: 100 mg/dL — AB
Specific Gravity, Urine: 1.028 (ref 1.005–1.030)
pH: 5 (ref 5.0–8.0)

## 2012-04-16 LAB — URINE MICROSCOPIC-ADD ON

## 2012-04-16 LAB — ABO/RH: ABO/RH(D): A POS

## 2012-04-16 LAB — SURGICAL PCR SCREEN
MRSA, PCR: NEGATIVE
Staphylococcus aureus: POSITIVE — AB

## 2012-04-16 MED ORDER — CHLORHEXIDINE GLUCONATE 4 % EX LIQD: 60.0000 mL | Freq: Once | CUTANEOUS | Status: DC

## 2012-04-16 NOTE — Pre-Procedure Instructions (Signed)
20 NAYLEAH BARTKIEWICZ  04/16/2012   Your procedure is scheduled on:  Mon, Jan 20 @ 7:30AM   Report to [place] at 5:30 AM.  Call this number if you have problems the morning of surgery: [phone]   Remember:   Do not eat food:After Midnight.  May have clear liquids:until Midnight .  Take these medicines the morning of surgery with A SIP OF WATER: Synthroid(Levothyroxine) and Pain Pill(if needed)   Do not wear jewelry, make-up or nail polish.  Do not wear lotions, powders, or perfumes. You may wear deodorant.  Do not shave 48 hours prior to surgery.   Do not bring valuables to the hospital.  Contacts, dentures or bridgework may not be worn into surgery.  Leave suitcase in the car. After surgery it may be brought to your room.  For patients admitted to the hospital, checkout time is 11:00 AM the day of discharge.   Patients discharged the day of surgery will not be allowed to drive home.    Special Instructions: Shower using CHG 2 nights before surgery and the night before surgery.  If you shower the day of surgery use CHG.  Use special wash - you have one bottle of CHG for all showers.  You should use approximately 1/3 of the bottle for each shower.   Please read over the following fact sheets that you were given: Pain Booklet, Coughing and Deep Breathing, Blood Transfusion Information, MRSA Information and Surgical Site Infection Prevention

## 2012-04-18 NOTE — Progress Notes (Signed)
Notified pt to have Mupirocin RX filled due to +staph.  Pt stated Dr. Otho Ket PA told her of the PCR results & stated she did not need to fill RX due to it "wasn't the bad bacteria".  Explained to pt [facility]'s policy that any one +for Staph or MRSA starts Mupirocin prophalytically.  Encouraged her to get Rx filled.  Pt stated she would.

## 2012-04-25 ENCOUNTER — Telehealth: Payer: Self-pay | Admitting: Cardiovascular Disease

## 2012-04-25 NOTE — Telephone Encounter (Signed)
Dr Otho Ket office wants to know if Dr Verl Blalock will clear [name] for surgery based on the cardiology consult note co-signed by Dr Verl Blalock from [facility] on 03/20/12 without a cardiology clinic appt?

## 2012-04-25 NOTE — Telephone Encounter (Signed)
New problem:    Need cardiac clearance - lumbar fusion. Surgery on 1/20.

## 2012-04-25 NOTE — H&P (Addendum)
[name] is an 62 y.o. female.   Chief Complaint: back pain and right leg pain, numbness and weakness. HPI: She has been experiencing pain in her back and radiation into her right leg since December of 2012.  She relates the pain is severe and it has worsened over the last 3 or 4 months.  She has had epidural steroid injections in attempts of relieving pain with Dr. [name] and the pain at this point radiates down the right leg with feelings of paresthesias, pins and needles, burning sensation to her back.  She is having trouble sleeping because of the pain into her leg.  She reports that she can only really walk 15 or 20 feet before the pain is severe and she needs to find a place to sit.  No bowel or bladder symptoms other than some constipation with her medicines. The pain radiates into the right anterior and lateral thigh and on the right lower back, upper buttock. She says the pain is like a bee sting into the middle of the back to her leg.  No pain with coughing or sneezing.  The pain is worse when standing and walking, sitting also bothers it.  She has trouble grocery shopping.  Pain with stair climbing as well.  On a scale of 1-10 she describes her pain as being a 10.  She is taking oxycodone every 12 hours.  She takes the oxycodone as many as 2 or 3 times a day.  Her primary care physician is Dr. Loura Pardon. MRI study shows  that she has a protruded disk at the L4-5 level and a tiny broad based disk bulge at the L3-4 level.  Two millimeter anterolisthesis was noted at the L4-5 level which creates moderate-to-severe lumbar spinal stenosis and severe right moderately severe left lateral recess stenosis.  She has severe arthrosis changes at L5-S1 with joint narrowing, flavum hypertrophy, severe left foraminal stenosis that could affect the left L5 nerve root.   There is a small broad based protruded disk at 4-5 present.  Overall I think the bigger concern really has to do with the finding of a  spondylolisthesis at the L4-5 level and also spondylolisthesis occurring at the L3-4 level.  The 4-5 level is a larger degree slip than that at 3-4.  Disks are degenerated at both of these segments.  The disk at the L5-S1 level appears narrowed and the L5-S1 level may be a transition type segment.  She has degenerative disk changes also at the L1-2 level. She has findings consistent with the facet syndrome occurring at both the L3-4 and L4-5 level with fluid present within the joint that suggest that there is subluxation of the joint that occurs when she is upright and not well seen on the patient's MRI study in a recumbent position. PLAN:  Pt needs to have decompression at L3-4,L4-5, Gill procedure at the 4-5 level and decompression of the neural foramen at the left L5 level.  Fusion should be performed to prevent the patient's slip from worsening and being the cause of a recurring foraminal entrapment over time.  The risk of surgery including the risk of infection, bleeding neurologic compromise were discussed with Pamala Hurry.  Attempts at epidural steroids really have not been of benefit.  She has had over a year of conservative management here.Pt wishes to proceed.  Past Medical History  Diagnosis Date  . GERD (gastroesophageal reflux disease)   . Hypothyroidism   . Peripheral neuropathy   . Cardiomyopathy   .  Chest pain 06/2005    Hospitalized, dystolic dysfunction  . Sciatica   . Type II diabetes mellitus   . PONV (postoperative nausea and vomiting)   . H/O hiatal hernia   . Migraines     "it's been a long time since I've had one" (03/20/2012)  . Osteoarthritis   . Arthritis     "all over; mostly in my back" (03/20/2012)  . Chronic lower back pain     "have 2 herniated discs; going to have to have a fusion" (03/20/2012)  . Kidney stones     "never had OR" (03/20/2012)  . Breast cancer     "right" (03/20/2012)  . Family history of anesthesia complication     "father has nausea and  vomiting"  . Anginal pain 03/19/2012    saw Dr. Cathie Olden  . Irritable bowel syndrome   . Hypertension     "patient states never had HTN, takes Hyzaar for heart    Past Surgical History  Procedure Date  . Abdominal hysterectomy 1993  . Total knee arthroplasty 2000    Right  . Mastectomy with axillary lymph node dissection 12/2003    "right" (03/20/2012)  . Thyroidectomy 1977    Right  . Posterior cervical fusion/foraminotomy 2001  . Shoulder arthroscopy w/ rotator cuff repair 2003    Left  . Cardiac catheterization ? 2005 / 2007  . Adenosin cardiolite 07/2005    (+) wall motion abnormality  . Esophagogastroduodenoscopy 2005    GERD  . [place] of abdomen 07/2005    Fatty liver / kidney stones  . Ct abdomen and pelvis 02/2010    Same  . Knee arthroscopy 1990's    "right" (03/20/2012)  . Axillary lymph node dissection 2003    squamous cell cancer    Family History  Problem Relation Age of Onset  . Hypertension Mother   . Diabetes Father   . Hypertension Father   . Hypertension Sister   . Diabetes Other     Family Hx. of Diabetes  . Hypertension Other     Family History of HTN  . Hypertension Brother   . Hypertension Brother   . Diabetes Brother    Social History:  reports that she has never smoked. She has never used smokeless tobacco. She reports that she does not drink alcohol or use illicit drugs.  Allergies:  Allergies  Allergen Reactions  . Dilaudid (Hydromorphone Hcl) Nausea And Vomiting  . Ampicillin Rash    REACTION: Rash and welts on her hands when she took it in the hospital  . Latex Itching, Dermatitis and Rash    REACTION: Patient had a reaction to tape after surgery and they told her she was allergic to Latex 03/20/2012 "if it's on too long it will break me out"    No prescriptions prior to admission    No results found for this or any previous visit (from the past 48 hour(s)). No results found.  Review of Systems  Constitutional: Negative.     HENT: Positive for neck pain.   Eyes: Negative.   Respiratory: Negative.   Cardiovascular: Negative.   Gastrointestinal: Negative.   Genitourinary: Negative.   Musculoskeletal: Positive for joint pain.  Skin: Negative.   Neurological: Positive for tingling and focal weakness.  Endo/Heme/Allergies: Negative.   Psychiatric/Behavioral: Negative.     There were no vitals taken for this visit. Physical Exam  Constitutional: She is oriented to person, place, and time. She appears well-developed and well-nourished.  HENT:  Head: Normocephalic and atraumatic.  Eyes: EOM are normal. Pupils are equal, round, and reactive to light.  Neck:       Decreased ROM secondary to previous fusion of C spine  Cardiovascular: Normal rate, regular rhythm, normal heart sounds and intact distal pulses.   Respiratory: Effort normal and breath sounds normal.  GI: Soft. Bowel sounds are normal.  Musculoskeletal:         She has difficulty arising from sitting position.  She is able to forward bend fingertips to about 4-6 inches from below her knee.  She has pain with attempts at extension.  There is weakness in her right EHL and is 4+/5.  Left side is normal.  Sciatic tension tests are negative both sides.  Range of motion of the hips does not cause pain.  Neurological: She is alert and oriented to person, place, and time.  Skin: Skin is warm and dry.  Psychiatric: She has a normal mood and affect.     Assessment/Plan Degenerative spondylolisthesis L3-4 and L4-5, severe bilateral lateral recess stenosis L4-5 and severe foraminal stenosis Left L5-S1  PLAN:  Central decompression at L3-4 and L4-5, Gill procedure at L4-5, decompression of neural foramen Left L5.  TLIF L3-4 and L4-5 with interbody cages, pedicle screw and rod fixation L3-L5 with local and allograft bone graft. Bone marrow aspirate. Pt seen by DR Dennard Schaumann , her PCP, and felt stable to proceed with surgery.  VERNON,SHEILA M 04/25/2012, 10:47  AM

## 2012-04-25 NOTE — Telephone Encounter (Signed)
If she is clear for surgery as my consult note stated.

## 2012-04-26 NOTE — Telephone Encounter (Signed)
LM for [facility]

## 2012-04-26 NOTE — H&P (Addendum)
Patient was seen and examined in the preop holding area. There has been no interval  Change in this patient's exam preop  history and physical exam  Lab tests and images have been examined and reviewed.  The Risks benefits and alternative treatments have been discussed  extensively,questions answered.  The patient has elected to undergo the discussed surgical treatment.   Preoperative studies were reviewed. This patient has a transition level L5-S1 that the radiologist has labeled S1-2. The  levels that are to be fused are the third lowest open disc space and the second lowest open disc space. The radiology reading is labelled one space below the permitted levels due to the transition segment, lowest open disc being labelled L5-S1 by myself.

## 2012-04-27 NOTE — Telephone Encounter (Signed)
Faxed Dr. Winnifred Friar consult noted with clearance to Lorette Ang RN

## 2012-04-29 MED ORDER — VANCOMYCIN HCL IN DEXTROSE 1-5 GM/200ML-% IV SOLN
1000.0000 mg | INTRAVENOUS | Status: AC
  Administered 2012-04-30: 1000 mg via INTRAVENOUS
  Filled 2012-04-29: qty 200

## 2012-04-30 ENCOUNTER — Inpatient Hospital Stay (HOSPITAL_COMMUNITY): Payer: Medicare Other | Admitting: Vascular Surgery

## 2012-04-30 ENCOUNTER — Inpatient Hospital Stay (HOSPITAL_COMMUNITY)
Admission: RE | Admit: 2012-04-30 | Discharge: 2012-05-05 | DRG: 460 | Disposition: A | Discharge status: Skilled Nursing Facility | Payer: Medicare Other | Source: Ambulatory Visit | Attending: Specialist | Admitting: Specialist

## 2012-04-30 ENCOUNTER — Inpatient Hospital Stay (HOSPITAL_COMMUNITY): Payer: Medicare Other

## 2012-04-30 ENCOUNTER — Encounter (HOSPITAL_COMMUNITY): Payer: Self-pay | Admitting: Surgery

## 2012-04-30 ENCOUNTER — Encounter (HOSPITAL_COMMUNITY): Payer: Self-pay | Admitting: Vascular Surgery

## 2012-04-30 ENCOUNTER — Encounter (HOSPITAL_COMMUNITY)
Admission: RE | Disposition: A | Discharge status: Skilled Nursing Facility | Payer: Self-pay | Source: Ambulatory Visit | Attending: Specialist

## 2012-04-30 ENCOUNTER — Encounter (HOSPITAL_COMMUNITY): Payer: Self-pay | Admitting: Specialist

## 2012-04-30 DIAGNOSIS — I10 Essential (primary) hypertension: Secondary | ICD-10-CM | POA: Diagnosis present

## 2012-04-30 DIAGNOSIS — Z96659 Presence of unspecified artificial knee joint: Secondary | ICD-10-CM

## 2012-04-30 DIAGNOSIS — K219 Gastro-esophageal reflux disease without esophagitis: Secondary | ICD-10-CM | POA: Diagnosis present

## 2012-04-30 DIAGNOSIS — K567 Ileus, unspecified: Secondary | ICD-10-CM | POA: Diagnosis not present

## 2012-04-30 DIAGNOSIS — R339 Retention of urine, unspecified: Secondary | ICD-10-CM | POA: Diagnosis not present

## 2012-04-30 DIAGNOSIS — K9189 Other postprocedural complications and disorders of digestive system: Secondary | ICD-10-CM | POA: Diagnosis not present

## 2012-04-30 DIAGNOSIS — M48062 Spinal stenosis, lumbar region with neurogenic claudication: Secondary | ICD-10-CM | POA: Diagnosis present

## 2012-04-30 DIAGNOSIS — D62 Acute posthemorrhagic anemia: Secondary | ICD-10-CM | POA: Diagnosis not present

## 2012-04-30 DIAGNOSIS — M4310 Spondylolisthesis, site unspecified: Principal | ICD-10-CM | POA: Diagnosis present

## 2012-04-30 DIAGNOSIS — M4316 Spondylolisthesis, lumbar region: Secondary | ICD-10-CM | POA: Diagnosis present

## 2012-04-30 DIAGNOSIS — Z853 Personal history of malignant neoplasm of breast: Secondary | ICD-10-CM

## 2012-04-30 DIAGNOSIS — M1990 Unspecified osteoarthritis, unspecified site: Secondary | ICD-10-CM | POA: Diagnosis present

## 2012-04-30 DIAGNOSIS — K560 Paralytic ileus: Secondary | ICD-10-CM | POA: Diagnosis not present

## 2012-04-30 DIAGNOSIS — M549 Dorsalgia, unspecified: Secondary | ICD-10-CM | POA: Diagnosis present

## 2012-04-30 LAB — GLUCOSE, CAPILLARY

## 2012-04-30 SURGERY — POSTERIOR LUMBAR FUSION 2 LEVEL
Anesthesia: General | Site: Spine Lumbar | Wound class: Clean

## 2012-04-30 MED ORDER — LOSARTAN POTASSIUM-HCTZ 50-12.5 MG PO TABS: 1.0000 | ORAL_TABLET | Freq: Every day | ORAL | Status: DC

## 2012-04-30 MED ORDER — VANCOMYCIN HCL IN DEXTROSE 1-5 GM/200ML-% IV SOLN
1000.0000 mg | Freq: Two times a day (BID) | INTRAVENOUS | Status: DC
  Administered 2012-04-30 – 2012-05-02 (×5): 1000 mg via INTRAVENOUS
  Filled 2012-04-30 (×6): qty 200

## 2012-04-30 MED ORDER — DIPHENHYDRAMINE HCL 50 MG/ML IJ SOLN: 12.5000 mg | Freq: Four times a day (QID) | INTRAMUSCULAR | Status: DC | PRN

## 2012-04-30 MED ORDER — ARTIFICIAL TEARS OP OINT
TOPICAL_OINTMENT | OPHTHALMIC | Status: DC | PRN
  Administered 2012-04-30: 1 via OPHTHALMIC

## 2012-04-30 MED ORDER — SENNOSIDES-DOCUSATE SODIUM 8.6-50 MG PO TABS
1.0000 | ORAL_TABLET | Freq: Every evening | ORAL | Status: DC | PRN
  Administered 2012-05-02: 1 via ORAL
  Filled 2012-04-30: qty 1

## 2012-04-30 MED ORDER — VITAMIN D 50 MCG (2000 UT) PO CAPS: 4000.0000 [IU] | ORAL_CAPSULE | Freq: Every day | ORAL | Status: DC

## 2012-04-30 MED ORDER — ZOLPIDEM TARTRATE 5 MG PO TABS: 5.0000 mg | ORAL_TABLET | Freq: Every evening | ORAL | Status: DC | PRN

## 2012-04-30 MED ORDER — 0.9 % SODIUM CHLORIDE (POUR BTL) OPTIME
TOPICAL | Status: DC | PRN
  Administered 2012-04-30: 1000 mL

## 2012-04-30 MED ORDER — MUPIROCIN 2 % EX OINT
TOPICAL_OINTMENT | Freq: Two times a day (BID) | CUTANEOUS | Status: DC
  Administered 2012-04-30: 23:00:00 via NASAL
  Administered 2012-04-30: 1 via NASAL
  Administered 2012-05-01 – 2012-05-02 (×4): via NASAL
  Filled 2012-04-30 (×2): qty 22

## 2012-04-30 MED ORDER — OXYCODONE-ACETAMINOPHEN 5-325 MG PO TABS
1.0000 | ORAL_TABLET | ORAL | Status: DC | PRN
  Administered 2012-05-01 – 2012-05-02 (×6): 2 via ORAL
  Administered 2012-05-04: 1 via ORAL
  Administered 2012-05-04 – 2012-05-05 (×2): 2 via ORAL
  Filled 2012-04-30 (×2): qty 2
  Filled 2012-04-30: qty 1
  Filled 2012-04-30 (×3): qty 2
  Filled 2012-04-30: qty 1
  Filled 2012-04-30 (×3): qty 2

## 2012-04-30 MED ORDER — METHOCARBAMOL 500 MG PO TABS
500.0000 mg | ORAL_TABLET | Freq: Four times a day (QID) | ORAL | Status: DC | PRN
  Administered 2012-05-01 – 2012-05-04 (×5): 500 mg via ORAL
  Filled 2012-04-30 (×6): qty 1

## 2012-04-30 MED ORDER — ACETAMINOPHEN 10 MG/ML IV SOLN
1000.0000 mg | Freq: Once | INTRAVENOUS | Status: AC
  Administered 2012-04-30: 1000 mg via INTRAVENOUS

## 2012-04-30 MED ORDER — PANTOPRAZOLE SODIUM 40 MG IV SOLR
40.0000 mg | Freq: Every day | INTRAVENOUS | Status: DC
Start: 2012-04-30 — End: 2012-05-05
  Administered 2012-04-30 – 2012-05-04 (×5): 40 mg via INTRAVENOUS
  Filled 2012-04-30 (×7): qty 40

## 2012-04-30 MED ORDER — SCOPOLAMINE 1 MG/3DAYS TD PT72
MEDICATED_PATCH | TRANSDERMAL | Status: AC
  Administered 2012-04-30: 1 via TRANSDERMAL
  Filled 2012-04-30: qty 1

## 2012-04-30 MED ORDER — ONDANSETRON HCL 4 MG/2ML IJ SOLN: 4.0000 mg | Freq: Four times a day (QID) | INTRAMUSCULAR | Status: DC | PRN

## 2012-04-30 MED ORDER — MENTHOL 3 MG MT LOZG
1.0000 | LOZENGE | OROMUCOSAL | Status: DC | PRN
  Administered 2012-05-01: 3 mg via ORAL
  Filled 2012-04-30: qty 9

## 2012-04-30 MED ORDER — SODIUM CHLORIDE 0.9 % IJ SOLN: 9.0000 mL | INTRAMUSCULAR | Status: DC | PRN

## 2012-04-30 MED ORDER — ACETAMINOPHEN 10 MG/ML IV SOLN
INTRAVENOUS | Status: AC
  Filled 2012-04-30: qty 100

## 2012-04-30 MED ORDER — LEVOTHYROXINE SODIUM 100 MCG PO TABS
100.0000 ug | ORAL_TABLET | Freq: Every day | ORAL | Status: DC
  Administered 2012-05-01 – 2012-05-05 (×5): 100 ug via ORAL
  Filled 2012-04-30 (×6): qty 1

## 2012-04-30 MED ORDER — ALBUMIN HUMAN 5 % IV SOLN: INTRAVENOUS | Status: DC | PRN

## 2012-04-30 MED ORDER — HYDROMORPHONE HCL PF 1 MG/ML IJ SOLN: 0.2500 mg | INTRAMUSCULAR | Status: DC | PRN

## 2012-04-30 MED ORDER — ACETAMINOPHEN 650 MG RE SUPP: 650.0000 mg | RECTAL | Status: DC | PRN

## 2012-04-30 MED ORDER — DIPHENHYDRAMINE HCL 12.5 MG/5ML PO ELIX: 12.5000 mg | ORAL_SOLUTION | Freq: Four times a day (QID) | ORAL | Status: DC | PRN

## 2012-04-30 MED ORDER — LOSARTAN POTASSIUM 50 MG PO TABS
50.0000 mg | ORAL_TABLET | Freq: Every day | ORAL | Status: DC
  Administered 2012-05-04 – 2012-05-05 (×2): 50 mg via ORAL
  Filled 2012-04-30 (×6): qty 1

## 2012-04-30 MED ORDER — NEOSTIGMINE METHYLSULFATE 1 MG/ML IJ SOLN
INTRAMUSCULAR | Status: DC | PRN
  Administered 2012-04-30: 3 mg via INTRAVENOUS

## 2012-04-30 MED ORDER — MORPHINE SULFATE (PF) 1 MG/ML IV SOLN
INTRAVENOUS | Status: AC
  Filled 2012-04-30: qty 25

## 2012-04-30 MED ORDER — PHENYLEPHRINE HCL 10 MG/ML IJ SOLN
INTRAMUSCULAR | Status: DC | PRN
  Administered 2012-04-30: 120 ug via INTRAVENOUS
  Administered 2012-04-30: 80 ug via INTRAVENOUS
  Administered 2012-04-30: 120 ug via INTRAVENOUS
  Administered 2012-04-30: 80 ug via INTRAVENOUS

## 2012-04-30 MED ORDER — OXYCODONE HCL ER 15 MG PO T12A
15.0000 mg | EXTENDED_RELEASE_TABLET | Freq: Two times a day (BID) | ORAL | Status: DC
  Administered 2012-04-30: 15 mg via ORAL
  Filled 2012-04-30: qty 1

## 2012-04-30 MED ORDER — ROCURONIUM BROMIDE 100 MG/10ML IV SOLN
INTRAVENOUS | Status: DC | PRN
  Administered 2012-04-30: 50 mg via INTRAVENOUS

## 2012-04-30 MED ORDER — OXYCODONE HCL 5 MG/5ML PO SOLN: 5.0000 mg | Freq: Once | ORAL | Status: DC | PRN

## 2012-04-30 MED ORDER — PROPOFOL 10 MG/ML IV BOLUS
INTRAVENOUS | Status: DC | PRN
  Administered 2012-04-30: 180 mg via INTRAVENOUS

## 2012-04-30 MED ORDER — FENTANYL CITRATE 0.05 MG/ML IJ SOLN
INTRAMUSCULAR | Status: DC | PRN
  Administered 2012-04-30: 25 ug via INTRAVENOUS
  Administered 2012-04-30: 100 ug via INTRAVENOUS
  Administered 2012-04-30 (×2): 25 ug via INTRAVENOUS
  Administered 2012-04-30: 150 ug via INTRAVENOUS
  Administered 2012-04-30: 50 ug via INTRAVENOUS

## 2012-04-30 MED ORDER — ACETAMINOPHEN 325 MG PO TABS
650.0000 mg | ORAL_TABLET | ORAL | Status: DC | PRN
  Administered 2012-05-02 – 2012-05-03 (×4): 650 mg via ORAL
  Filled 2012-04-30 (×4): qty 2

## 2012-04-30 MED ORDER — INSULIN GLARGINE 100 UNIT/ML ~~LOC~~ SOLN
80.0000 [IU] | Freq: Every day | SUBCUTANEOUS | Status: DC
  Administered 2012-05-01 – 2012-05-04 (×4): 80 [IU] via SUBCUTANEOUS

## 2012-04-30 MED ORDER — MUPIROCIN 2 % EX OINT
TOPICAL_OINTMENT | CUTANEOUS | Status: AC
  Administered 2012-04-30: 1 via NASAL
  Filled 2012-04-30: qty 22

## 2012-04-30 MED ORDER — GLYCOPYRROLATE 0.2 MG/ML IJ SOLN
INTRAMUSCULAR | Status: DC | PRN
  Administered 2012-04-30: 0.4 mg via INTRAVENOUS

## 2012-04-30 MED ORDER — ALBUMIN HUMAN 5 % IV SOLN
INTRAVENOUS | Status: DC | PRN
  Administered 2012-04-30 (×2): via INTRAVENOUS

## 2012-04-30 MED ORDER — ASPIRIN EC 81 MG PO TBEC
81.0000 mg | DELAYED_RELEASE_TABLET | Freq: Every day | ORAL | Status: DC
  Administered 2012-04-30 – 2012-05-05 (×6): 81 mg via ORAL
  Filled 2012-04-30 (×7): qty 1

## 2012-04-30 MED ORDER — MIDAZOLAM HCL 5 MG/5ML IJ SOLN
INTRAMUSCULAR | Status: DC | PRN
  Administered 2012-04-30: 2 mg via INTRAVENOUS

## 2012-04-30 MED ORDER — VECURONIUM BROMIDE 10 MG IV SOLR
INTRAVENOUS | Status: DC | PRN
  Administered 2012-04-30 (×4): 2 mg via INTRAVENOUS

## 2012-04-30 MED ORDER — MORPHINE SULFATE 2 MG/ML IJ SOLN
1.0000 mg | INTRAMUSCULAR | Status: DC | PRN
  Administered 2012-04-30 (×2): 1 mg via INTRAVENOUS

## 2012-04-30 MED ORDER — ONDANSETRON HCL 4 MG/2ML IJ SOLN
4.0000 mg | INTRAMUSCULAR | Status: DC | PRN
  Administered 2012-05-01: 4 mg via INTRAVENOUS
  Filled 2012-04-30: qty 2

## 2012-04-30 MED ORDER — HYDROCHLOROTHIAZIDE 12.5 MG PO CAPS
12.5000 mg | ORAL_CAPSULE | Freq: Every day | ORAL | Status: DC
  Administered 2012-05-04 – 2012-05-05 (×2): 12.5 mg via ORAL
  Filled 2012-04-30 (×6): qty 1

## 2012-04-30 MED ORDER — LIDOCAINE HCL (CARDIAC) 20 MG/ML IV SOLN
INTRAVENOUS | Status: DC | PRN
  Administered 2012-04-30: 100 mg via INTRAVENOUS

## 2012-04-30 MED ORDER — FLEET ENEMA 7-19 GM/118ML RE ENEM: 1.0000 | ENEMA | Freq: Once | RECTAL | Status: AC | PRN

## 2012-04-30 MED ORDER — VITAMIN D3 25 MCG (1000 UNIT) PO TABS
4000.0000 [IU] | ORAL_TABLET | Freq: Every day | ORAL | Status: DC
  Administered 2012-04-30 – 2012-05-05 (×6): 4000 [IU] via ORAL
  Filled 2012-04-30 (×7): qty 4

## 2012-04-30 MED ORDER — ASPIRIN 81 MG PO TABS: 81.0000 mg | ORAL_TABLET | Freq: Every day | ORAL | Status: DC

## 2012-04-30 MED ORDER — SODIUM CHLORIDE 0.9 % IJ SOLN: 3.0000 mL | INTRAMUSCULAR | Status: DC | PRN

## 2012-04-30 MED ORDER — THROMBIN 20000 UNITS EX SOLR
CUTANEOUS | Status: AC
  Filled 2012-04-30: qty 20000

## 2012-04-30 MED ORDER — SODIUM CHLORIDE 0.9 % IJ SOLN
3.0000 mL | Freq: Two times a day (BID) | INTRAMUSCULAR | Status: DC
  Administered 2012-04-30 – 2012-05-01 (×2): 3 mL via INTRAVENOUS

## 2012-04-30 MED ORDER — THROMBIN 20000 UNITS EX SOLR
CUTANEOUS | Status: DC | PRN
  Administered 2012-04-30: 09:00:00 via TOPICAL

## 2012-04-30 MED ORDER — SODIUM CHLORIDE 0.9 % IV SOLN
INTRAVENOUS | Status: DC | PRN
  Administered 2012-04-30: 11:00:00 via INTRAVENOUS

## 2012-04-30 MED ORDER — BUPIVACAINE-EPINEPHRINE (PF) 0.5% -1:200000 IJ SOLN
INTRAMUSCULAR | Status: AC
  Filled 2012-04-30: qty 10

## 2012-04-30 MED ORDER — BISACODYL 10 MG RE SUPP: 10.0000 mg | Freq: Every day | RECTAL | Status: DC | PRN

## 2012-04-30 MED ORDER — OXYCODONE HCL 5 MG PO TABS: 5.0000 mg | ORAL_TABLET | Freq: Once | ORAL | Status: DC | PRN

## 2012-04-30 MED ORDER — LACTATED RINGERS IV SOLN
INTRAVENOUS | Status: DC | PRN
  Administered 2012-04-30 (×3): via INTRAVENOUS

## 2012-04-30 MED ORDER — HYDROCODONE-ACETAMINOPHEN 5-325 MG PO TABS
1.0000 | ORAL_TABLET | ORAL | Status: DC | PRN
  Administered 2012-05-03 – 2012-05-04 (×2): 1 via ORAL
  Filled 2012-04-30: qty 1

## 2012-04-30 MED ORDER — BUPIVACAINE-EPINEPHRINE 0.5% -1:200000 IJ SOLN
INTRAMUSCULAR | Status: DC | PRN
  Administered 2012-04-30: 10 mL

## 2012-04-30 MED ORDER — PHENOL 1.4 % MT LIQD: 1.0000 | OROMUCOSAL | Status: DC | PRN

## 2012-04-30 MED ORDER — METFORMIN HCL 850 MG PO TABS
850.0000 mg | ORAL_TABLET | Freq: Two times a day (BID) | ORAL | Status: DC
  Administered 2012-05-01 – 2012-05-05 (×9): 850 mg via ORAL
  Filled 2012-04-30 (×11): qty 1

## 2012-04-30 MED ORDER — DOCUSATE SODIUM 100 MG PO CAPS
100.0000 mg | ORAL_CAPSULE | Freq: Two times a day (BID) | ORAL | Status: DC
  Administered 2012-04-30 – 2012-05-05 (×10): 100 mg via ORAL
  Filled 2012-04-30 (×12): qty 1

## 2012-04-30 MED ORDER — MORPHINE SULFATE 2 MG/ML IJ SOLN
INTRAMUSCULAR | Status: AC
  Administered 2012-04-30: 1 mg via INTRAVENOUS
  Filled 2012-04-30: qty 1

## 2012-04-30 MED ORDER — MORPHINE SULFATE (PF) 1 MG/ML IV SOLN
INTRAVENOUS | Status: DC
  Administered 2012-04-30: 19:00:00 via INTRAVENOUS
  Administered 2012-04-30: 13.2 mg via INTRAVENOUS
  Administered 2012-04-30: 14:00:00 via INTRAVENOUS
  Administered 2012-05-01: 0.399 mg via INTRAVENOUS
  Filled 2012-04-30: qty 25

## 2012-04-30 MED ORDER — SODIUM CHLORIDE 0.9 % IV SOLN: 250.0000 mL | INTRAVENOUS | Status: DC

## 2012-04-30 MED ORDER — NALOXONE HCL 0.4 MG/ML IJ SOLN: 0.4000 mg | INTRAMUSCULAR | Status: DC | PRN

## 2012-04-30 MED ORDER — SODIUM CHLORIDE 0.9 % IV SOLN
10.0000 mg | INTRAVENOUS | Status: DC | PRN
  Administered 2012-04-30: 20 ug/min via INTRAVENOUS

## 2012-04-30 MED ORDER — ONDANSETRON HCL 4 MG/2ML IJ SOLN
INTRAMUSCULAR | Status: DC | PRN
  Administered 2012-04-30: 4 mg via INTRAVENOUS

## 2012-04-30 MED ORDER — POTASSIUM CHLORIDE IN NACL 20-0.45 MEQ/L-% IV SOLN
INTRAVENOUS | Status: DC
  Administered 2012-05-02 – 2012-05-04 (×6): via INTRAVENOUS
  Administered 2012-05-05: 1000 mL via INTRAVENOUS
  Administered 2012-05-05: 01:00:00 via INTRAVENOUS
  Filled 2012-04-30 (×17): qty 1000

## 2012-04-30 MED ORDER — INSULIN ASPART 100 UNIT/ML ~~LOC~~ SOLN
0.0000 [IU] | Freq: Three times a day (TID) | SUBCUTANEOUS | Status: DC
  Administered 2012-05-01 (×2): 2 [IU] via SUBCUTANEOUS
  Administered 2012-05-01: 3 [IU] via SUBCUTANEOUS
  Administered 2012-05-02 – 2012-05-04 (×4): 2 [IU] via SUBCUTANEOUS
  Administered 2012-05-04: 3 [IU] via SUBCUTANEOUS
  Administered 2012-05-05: 2 [IU] via SUBCUTANEOUS

## 2012-04-30 MED ORDER — NITROGLYCERIN 0.4 MG SL SUBL: 0.4000 mg | SUBLINGUAL_TABLET | SUBLINGUAL | Status: DC | PRN

## 2012-04-30 MED ORDER — METHOCARBAMOL 100 MG/ML IJ SOLN
500.0000 mg | Freq: Four times a day (QID) | INTRAMUSCULAR | Status: DC | PRN
  Administered 2012-04-30: 500 mg via INTRAVENOUS
  Filled 2012-04-30: qty 5

## 2012-04-30 SURGICAL SUPPLY — 75 items
ADH SKN CLS APL DERMABOND .7 (GAUZE/BANDAGES/DRESSINGS) ×1
ADH SKN CLS LQ APL DERMABOND (GAUZE/BANDAGES/DRESSINGS) ×1
BLADE SURG ROTATE 9660 (MISCELLANEOUS) IMPLANT
BUR ROUND FLUTED 4 SOFT TCH (BURR) ×2 IMPLANT
CAGE CONCORDE BULLET 9X11X23 (Cage) ×4 IMPLANT
CAGE SPNL 5D BLT NOSE 23X9X11 (Cage) IMPLANT
CLOTH BEACON ORANGE TIMEOUT ST (SAFETY) ×2 IMPLANT
CONNECTOR SFX SZ A7 (Orthopedic Implant) ×1 IMPLANT
CORDS BIPOLAR (ELECTRODE) ×2 IMPLANT
COVER MAYO STAND STRL (DRAPES) ×4 IMPLANT
COVER SURGICAL LIGHT HANDLE (MISCELLANEOUS) ×2 IMPLANT
DERMABOND ADHESIVE PROPEN (GAUZE/BANDAGES/DRESSINGS) ×1
DERMABOND ADVANCED (GAUZE/BANDAGES/DRESSINGS) ×1
DERMABOND ADVANCED .7 DNX12 (GAUZE/BANDAGES/DRESSINGS) ×1 IMPLANT
DERMABOND ADVANCED .7 DNX6 (GAUZE/BANDAGES/DRESSINGS) IMPLANT
DRAPE C-ARM 42X72 X-RAY (DRAPES) ×3 IMPLANT
DRAPE SURG 17X23 STRL (DRAPES) ×6 IMPLANT
DRAPE TABLE COVER HEAVY DUTY (DRAPES) ×2 IMPLANT
DRSG MEPILEX BORDER 4X4 (GAUZE/BANDAGES/DRESSINGS) ×1 IMPLANT
DRSG MEPILEX BORDER 4X8 (GAUZE/BANDAGES/DRESSINGS) ×1 IMPLANT
DURAPREP 26ML APPLICATOR (WOUND CARE) ×2 IMPLANT
ELECT BLADE 4.0 EZ CLEAN MEGAD (MISCELLANEOUS) ×2
ELECT BLADE 6.5 EXT (BLADE) ×1 IMPLANT
ELECT CAUTERY BLADE 6.4 (BLADE) ×2 IMPLANT
ELECT REM PT RETURN 9FT ADLT (ELECTROSURGICAL) ×2
ELECTRODE BLDE 4.0 EZ CLN MEGD (MISCELLANEOUS) IMPLANT
ELECTRODE REM PT RTRN 9FT ADLT (ELECTROSURGICAL) ×1 IMPLANT
EVACUATOR 1/8 PVC DRAIN (DRAIN) IMPLANT
GLOVE BIOGEL PI IND STRL 7.5 (GLOVE) ×1 IMPLANT
GLOVE BIOGEL PI INDICATOR 7.5 (GLOVE) ×1
GLOVE ECLIPSE 7.0 STRL STRAW (GLOVE) ×1 IMPLANT
GLOVE ECLIPSE 8.5 STRL (GLOVE) ×2 IMPLANT
GLOVE SURG 8.5 LATEX PF (GLOVE) ×1 IMPLANT
GOWN PREVENTION PLUS LG XLONG (DISPOSABLE) ×1 IMPLANT
GOWN PREVENTION PLUS XXLARGE (GOWN DISPOSABLE) ×2 IMPLANT
GOWN STRL NON-REIN LRG LVL3 (GOWN DISPOSABLE) ×4 IMPLANT
GOWN STRL REIN XL XLG (GOWN DISPOSABLE) ×1 IMPLANT
HEMOSTAT SURGICEL 2X14 (HEMOSTASIS) IMPLANT
KIT BASIN OR (CUSTOM PROCEDURE TRAY) ×2 IMPLANT
KIT ROOM TURNOVER OR (KITS) ×2 IMPLANT
NDL ASP BONE MRW 11GX15 (NEEDLE) IMPLANT
NDL SPNL 18GX3.5 QUINCKE PK (NEEDLE) ×1 IMPLANT
NEEDLE 22X1 1/2 (OR ONLY) (NEEDLE) ×2 IMPLANT
NEEDLE ASP BONE MRW 11GX15 (NEEDLE) IMPLANT
NEEDLE BONE MARROW 8GAX6 (NEEDLE) IMPLANT
NEEDLE SPNL 18GX3.5 QUINCKE PK (NEEDLE) ×2 IMPLANT
NS IRRIG 1000ML POUR BTL (IV SOLUTION) ×2 IMPLANT
PACK LAMINECTOMY ORTHO (CUSTOM PROCEDURE TRAY) ×2 IMPLANT
PAD ARMBOARD 7.5X6 YLW CONV (MISCELLANEOUS) ×4 IMPLANT
PATTIES SURGICAL .75X.75 (GAUZE/BANDAGES/DRESSINGS) ×2 IMPLANT
PATTIES SURGICAL 1X1 (DISPOSABLE) ×2 IMPLANT
ROD EXPEDIUM 6.35X55MM (Rod) ×1 IMPLANT
ROD PREBENT EXPEDIUM 6.35X65MM (Rod) ×2 IMPLANT
SCREW EXP 6.35 POLY 6X40MM (Screw) ×2 IMPLANT
SCREW EXPEDIUM 6.35 7.0 35 (Screw) ×3 IMPLANT
SCREW POLY EXP 6.35 7X40MM (Screw) ×1 IMPLANT
SCREW SET EXPEDIUM 6.35 (Screw) ×6 IMPLANT
SPONGE LAP 18X18 X RAY DECT (DISPOSABLE) ×1 IMPLANT
SPONGE LAP 4X18 X RAY DECT (DISPOSABLE) ×2 IMPLANT
SPONGE SURGIFOAM ABS GEL 100 (HEMOSTASIS) ×2 IMPLANT
SUT VIC AB 0 CT1 27 (SUTURE) ×2
SUT VIC AB 0 CT1 27XBRD ANBCTR (SUTURE) ×1 IMPLANT
SUT VIC AB 1 CTX 36 (SUTURE) ×4
SUT VIC AB 1 CTX36XBRD ANBCTR (SUTURE) ×2 IMPLANT
SUT VIC AB 2-0 CT1 27 (SUTURE) ×2
SUT VIC AB 2-0 CT1 TAPERPNT 27 (SUTURE) ×1 IMPLANT
SUT VICRYL 0 CT 1 36IN (SUTURE) ×4 IMPLANT
SUT VICRYL 4-0 PS2 18IN ABS (SUTURE) ×4 IMPLANT
SYR CONTROL 10ML LL (SYRINGE) ×4 IMPLANT
TOWEL OR 17X24 6PK STRL BLUE (TOWEL DISPOSABLE) ×2 IMPLANT
TOWEL OR 17X26 10 PK STRL BLUE (TOWEL DISPOSABLE) ×2 IMPLANT
TRAY FOLEY BAG SILVER LF 14FR (CATHETERS) ×1 IMPLANT
TRAY FOLEY CATH 14FR (SET/KITS/TRAYS/PACK) ×1 IMPLANT
WATER STERILE IRR 1000ML POUR (IV SOLUTION) ×2 IMPLANT
YANKAUER SUCT BULB TIP NO VENT (SUCTIONS) ×2 IMPLANT

## 2012-04-30 NOTE — Progress Notes (Signed)
Orthopedic Tech Progress Note Patient Details:  [name] [birth year] [id number] Brace order completed by bio-tech vendor Ronalee Belts at 5:15 pm. Patient ID: SHENAE DAU, female   DOB: October 13, 1950, 62 y.o.   MRN: [id number]   Braulio Bosch 04/30/2012, 5:55 PM

## 2012-04-30 NOTE — Anesthesia Preprocedure Evaluation (Addendum)
Anesthesia Evaluation  Patient identified by MRN, date of birth, ID band Patient awake    Reviewed: Allergy & Precautions, H&P , Patient's Chart, lab work & pertinent test results  History of Anesthesia Complications (+) PONV  Airway Mallampati: II  Neck ROM: full    Dental  (+) Teeth Intact and Dental Advisory Given   Pulmonary          Cardiovascular hypertension, Pt. on medications + angina     Neuro/Psych  Headaches,  Neuromuscular disease    GI/Hepatic hiatal hernia, GERD-  Controlled,  Endo/Other  diabetes, Type 2Hypothyroidism obese  Renal/GU      Musculoskeletal  (+) Arthritis -,   Abdominal   Peds  Hematology   Anesthesia Other Findings   Reproductive/Obstetrics                         Anesthesia Physical Anesthesia Plan  ASA: III  Anesthesia Plan: General   Post-op Pain Management:    Induction: Intravenous  Airway Management Planned: Oral ETT  Additional Equipment:   Intra-op Plan:   Post-operative Plan: Extubation in OR  Informed Consent: I have reviewed the patients History and Physical, chart, labs and discussed the procedure including the risks, benefits and alternatives for the proposed anesthesia with the patient or authorized representative who has indicated his/her understanding and acceptance.     Plan Discussed with: Surgeon and CRNA  Anesthesia Plan Comments:         Anesthesia Quick Evaluation

## 2012-04-30 NOTE — Preoperative (Signed)
Beta Blockers   Reason not to administer Beta Blockers:Not Applicable 

## 2012-04-30 NOTE — Progress Notes (Signed)
When asked if Mupirocin was started patient stated "Shelia at Dr. Otho Ket office told me that I did not have to get that prescription filled because it wasn't the bad one that I had and they would give me plenty of antibiotics while in the hospital." Nurse explained to patient that per protocol she needed to get the prescription filled and use the ointment twice a day for 5 days. Patient stated "I thought she knew what she was talking about,.......[name] Kitchenbut I should have known to get it filled." Nurse placed order in EPIC and instructed patient that she needed to use ointment twice a day for the next five days. Patient verbalized understanding. First dose of mupirocin administered and placed on chart. Husband at bedside.

## 2012-04-30 NOTE — Progress Notes (Signed)
9.39mg  of morphine was cleared from pts PCA pump at 1835. Replaced PCA with new syringe.

## 2012-04-30 NOTE — Progress Notes (Signed)
Orthopedic Tech Progress Note Patient Details:  JAVON [name] [birth year] [id number] Called bio-tech on call vendor at 5:14 pm. Patient ID: LADRENA ERICKSEN, female   DOB: Jun 30, 1950, 62 y.o.   MRN: [id number]   Braulio Bosch 04/30/2012, 5:12 PM

## 2012-04-30 NOTE — Transfer of Care (Signed)
Immediate Anesthesia Transfer of Care Note  [name]  Procedure(s) Performed: Procedure(s) (LRB) with comments: POSTERIOR LUMBAR FUSION 2 LEVEL (N/A) - Central Laminectomy L5-S1, Gill Procedure L4-5, Right L3-4 and Right L4-5 TLIF, L3 to L5 segmented fixation with Depuy Expedium Rods and screws, local bone graft, Chronos Bone Extruder with Bone Marrow Aspirate  Patient Location: PACU  Anesthesia Type:General  Level of Consciousness: patient cooperative, lethargic and responds to stimulation  Airway & Oxygen Therapy: Patient Spontanous Breathing and Patient connected to nasal cannula oxygen  Post-op Assessment: Report given to PACU RN and Patient moving all extremities X 4  Post vital signs: Reviewed and stable  Complications: No apparent anesthesia complications

## 2012-04-30 NOTE — Progress Notes (Signed)
ANTIBIOTIC CONSULT NOTE - INITIAL  Pharmacy Consult for Vancomycin Indication: s/p spinal surgery with remaining drain in place  Allergies  Allergen Reactions  . Dilaudid (Hydromorphone Hcl) Nausea And Vomiting  . Ampicillin Rash    REACTION: Rash and welts on her hands when she took it in the hospital  . Latex Itching, Dermatitis and Rash    REACTION: Patient had a reaction to tape after surgery and they told her she was allergic to Latex 03/20/2012 "if it's on too long it will break me out"    Patient Measurements:   Adjusted Body Weight: 91 kg  Vital Signs: Temp: 98 F (36.7 C) (01/20 1641) Temp src: Oral (01/20 0613) BP: 99/55 mmHg (01/20 1641) Pulse Rate: 88  (01/20 1641) Intake/Output from previous day:   Intake/Output from this shift: Total I/O In: 4600 [I.V.:3350; Blood:750; IV Piggyback:500] Out: 2500 [Urine:500; Drains:100; Blood:1900]  Labs: No results found for this basename: WBC:3,HGB:3,PLT:3,LABCREA:3,CREATININE:3 in the last 72 hours The CrCl is unknown because both a height and weight (above a minimum accepted value) are required for this calculation. No results found for this basename: VANCOTROUGH:2,VANCOPEAK:2,VANCORANDOM:2,GENTTROUGH:2,GENTPEAK:2,GENTRANDOM:2,TOBRATROUGH:2,TOBRAPEAK:2,TOBRARND:2,AMIKACINPEAK:2,AMIKACINTROU:2,AMIKACIN:2, in the last 72 hours   Microbiology: Recent Results (from the past 720 hour(s))  SURGICAL PCR SCREEN     Status: Abnormal   Collection Time   04/16/12  3:26 PM      Component Value Range Status Comment   MRSA, PCR NEGATIVE  NEGATIVE Final    Staphylococcus aureus POSITIVE (*) NEGATIVE Final     Medical History: Past Medical History  Diagnosis Date  . GERD (gastroesophageal reflux disease)   . Hypothyroidism   . Peripheral neuropathy   . Cardiomyopathy   . Chest pain 06/2005    Hospitalized, dystolic dysfunction  . Sciatica   . Type II diabetes mellitus   . PONV (postoperative nausea and vomiting)   . H/O  hiatal hernia   . Migraines     "it's been a long time since I've had one" (03/20/2012)  . Osteoarthritis   . Arthritis     "all over; mostly in my back" (03/20/2012)  . Chronic lower back pain     "have 2 herniated discs; going to have to have a fusion" (03/20/2012)  . Kidney stones     "never had OR" (03/20/2012)  . Breast cancer     "right" (03/20/2012)  . Family history of anesthesia complication     "father has nausea and vomiting"  . Anginal pain 03/19/2012    saw Dr. Cathie Olden  . Irritable bowel syndrome   . Hypertension     "patient states never had HTN, takes Hyzaar for heart    Medications:  Prescriptions prior to admission  Medication Sig Dispense Refill  . aspirin 81 MG tablet Take 81 mg by mouth daily.      . Cholecalciferol (VITAMIN D) 2000 UNITS CAPS Take 4,000 Units by mouth daily.      . insulin aspart (NOVOLOG) 100 UNIT/ML injection Inject 15 Units into the skin 3 (three) times daily before meals.      . insulin glargine (LANTUS) 100 UNIT/ML injection Inject 80 Units into the skin at bedtime.       [name] Kitchen levothyroxine (SYNTHROID, LEVOTHROID) 100 MCG tablet Take 1 tablet (100 mcg total) by mouth daily.  30 tablet  6  . losartan-hydrochlorothiazide (HYZAAR) 50-12.5 MG per tablet Take 1 tablet by mouth daily.       . metFORMIN (GLUCOPHAGE) 850 MG tablet Take 850 mg by mouth 2 (  two) times daily with a meal.       . oxyCODONE-acetaminophen (PERCOCET/ROXICET) 5-325 MG per tablet Take 1 tablet by mouth every 4 (four) hours as needed. For pain      . nitroGLYCERIN (NITROSTAT) 0.4 MG SL tablet Place 0.4 mg under the tongue every 5 (five) minutes as needed. Chest pain      . OxyCODONE (OXYCONTIN) 15 mg TB12 Take 1 tablet (15 mg total) by mouth every 12 (twelve) hours.  20 tablet  0   Assessment: 62 y/o F with back pain and right leg pain, numbness and weakness s/p posterior lumbar fusion. Has failed conservative management for over a year. Complicated PMH.  Patient has hemovac  drain that remains in place. Normal renal function. Estimated CrCl>100  Goal of Therapy:  Vanco trough around 15  Plan:  Vancomycin 1g IV q12hrs.  Alford Highland, [employer] 04/30/2012,5:16 PM

## 2012-04-30 NOTE — Anesthesia Procedure Notes (Signed)
Procedure Name: Intubation Date/Time: 04/30/2012 7:44 AM Performed by: Sampson Si E Pre-anesthesia Checklist: Patient identified, Timeout performed, Emergency Drugs available, Suction available and Patient being monitored Patient Re-evaluated:Patient Re-evaluated prior to inductionOxygen Delivery Method: Circle system utilized Preoxygenation: Pre-oxygenation with 100% oxygen Intubation Type: IV induction Ventilation: Mask ventilation without difficulty and Oral airway inserted - appropriate to patient size Laryngoscope Size: Mac and 3 Grade View: Grade I Tube type: Oral Tube size: 7.5 mm Number of attempts: 1 Airway Equipment and Method: Stylet Placement Confirmation: ETT inserted through vocal cords under direct vision,  breath sounds checked- equal and bilateral and positive ETCO2 Secured at: 22 cm Tube secured with: Tape Dental Injury: Teeth and Oropharynx as per pre-operative assessment

## 2012-04-30 NOTE — Anesthesia Postprocedure Evaluation (Signed)
Anesthesia Post Note  [name]  Procedure(s) Performed: Procedure(s) (LRB): POSTERIOR LUMBAR FUSION 2 LEVEL (N/A)  Anesthesia type: General  Patient location: PACU  Post pain: Pain level controlled and Adequate analgesia  Post assessment: Post-op Vital signs reviewed, Patient's Cardiovascular Status Stable, Respiratory Function Stable, Patent Airway and Pain level controlled  Last Vitals:  Filed Vitals:   04/30/12 1315  BP:   Pulse: 80  Temp: 36.4 C  Resp:     Post vital signs: Reviewed and stable  Level of consciousness: awake, alert  and oriented  Complications: No apparent anesthesia complications

## 2012-04-30 NOTE — Brief Op Note (Signed)
04/30/2012  12:54 PM  PATIENT:  [name]  62 y.o. female  PRE-OPERATIVE DIAGNOSIS:  Degenerative spondylolisthesis L3-4, L4-5, Stenosis Bilateral L4-5 and Left L5-S1 Foraminal stenosis  POST-OPERATIVE DIAGNOSIS:  Degenerative spondylolisthesis L3-4, L4-5, Stenosis Bilateral L4-5 and Left L5-S1 Foraminal   PROCEDURE:  Procedure(s) (LRB) with comments: POSTERIOR LUMBAR FUSION 2 LEVEL (N/A) - Central Laminectomy L5-S1, Gill Procedure L4-5, Right L3-4 and Right L4-5 TLIF, L3 to L5 segmented fixation with Depuy Expedium Rods and screws, local bone graft, Chronos Bone Extruder with Bone Marrow Aspirate  SURGEON:  Surgeon(s) and Role:    Jessy Oto, MD - Primary  PHYSICIAN ASSISTANT:Sheila Vernon,PA-C   ANESTHESIA:   local, general and 10cc Marcaine 1/2% with epi Dr. Marcie Bal EBL:  Total I/O In: [id number] [I.V.:2650; Blood:750; IV [id number] Out: 2200 [Urine:300; Blood:1900]  BLOOD ADMINISTERED:700 CC CELLSAVER  DRAINS: (One) Hemovact drain(s) in the right lower lumbar with  Suction Open and Urinary Catheter (Foley)   LOCAL MEDICATIONS USED:  MARCAINE    and Amount: 10 ml  SPECIMEN:  No Specimen  DISPOSITION OF SPECIMEN:  N/A  COUNTS:  YES  TOURNIQUET:  * No tourniquets in log *  DICTATION: .Dragon Dictation  PLAN OF CARE: Admit to inpatient   PATIENT DISPOSITION:  PACU - hemodynamically stable.   Delay start of Pharmacological VTE agent (>24hrs) due to surgical blood loss or risk of bleeding: yes

## 2012-04-30 NOTE — H&P (Signed)
Patient was seen and examined in the preop holding area. There has been no interval  Change in this patient's exam preop  history and physical exam  Lab tests and images have been examined and reviewed.  The Risks benefits and alternative treatments have been discussed  extensively,questions answered.  The patient has elected to undergo the discussed surgical treatment. 

## 2012-04-30 NOTE — Op Note (Addendum)
04/30/2012  1:04 PM  PATIENT:  [name]  62 y.o. female  MRN: [id number]  OPERATIVE REPORT  PRE-OPERATIVE DIAGNOSIS:  Degenerative spondylolisthesis L3-4, L4-5, Stenosis Bilateral L4-5 and Left L5-S1 Foraminal stenosis  POST-OPERATIVE DIAGNOSIS:  Degenerative spondylolisthesis L3-4, L4-5, Stenosis Bilateral L4-5 and Left L5-S1 Foraminal stenosis.  PROCEDURE:  Procedure(s): POSTERIOR LUMBAR FUSION 2 LEVEL Central laminectomy L3-4 and L4-5 with Right L3-4 TLIF and Right L4-5 TLIF Segmental instrumentation 3 levels L3-5 with pedicle screws and rods.    SURGEON:  Jessy Oto, MD     ASSISTANT:  Phillips Hay, PA-C  (Present throughout the entire procedure and necessary for completion of procedure in a timely manner)     ANESTHESIA:  General,local with10cc marcaine 1/2% with epi Dr. Marcie Bal     COMPLICATIONS:  None.     COMPONENTS:Depuy expedium pedicle screws and rods. 24mm cages, lordotic  Depuy Concord cages L3-4 and L4-5. Crosslink A7  PROCEDURE: The patient was met in the holding area, and the appropriate Right L3-4 and L4-5 lumbar levels identified and marked with an "X" and my initials. I had discussion with the patient in the preop holding area regarding  consent form. Patient understands the rationale for the fusion site as the L3-4 and L4-5 segment  For stenosis and degenerative spondylolisthesis.  The patient was then transported to OR and was placed under general anestheticwithout difficulty. The patient received appropriate preoperative antibiotic prophylaxis  vancomycin for a nasal swab positive for MSSA and ampicillin allergy. . Nursing staff inserted a Foley catheter under sterile conditions. The patient was then turned to a prone position using the [place] spine frame. PAS. all pressure points well padded the arms at the side to 90 90. Standard prep with DuraPrep solution draped in the usual manner from the lower dorsal spine the mid sacral segment. Iodine  Vi-Drape was used and the incision was marked. Time-out procedure was called and correct. Skin in the midline between L2and S2 was then infiltrated with Marcaine half percent with 1-200,000 epinephrine total of 10 cc used. Incision was then made  through the skin and subcutaneous layers down to the patient's lumbodorsal fascia and spinous processes. The incision then carried sharply excising the supraspinous ligament and then continuing the lateral aspect of the spinous processes L3 L4-L5. Cobb elevators used to carefully elevate the paralumbar muscles off of the posterior elements using electrocautery carefully drilled bleeding and perform dissection of the muscle tissues of the preserving the facet at L2-3. Continuing the exposure out laterally to expose the transverse process of L3 L4, L5 and the sacrum in the midline and out to the sacra ala bilaterally incision was carried in the midline down to the S2-S3 level area bleeders controlled using electrocautery monopolar electrocautery. Clamps were then placed at the L2-3 level and also at the L4-5 level observed on C-arm fluoroscopy the upper clamp was beneath the L2 spinous processThe lower at L4-5.[name] Kitchen The level marked with the  OR marking pen sterilely. Continued exposure then carried down to the lateral aspect of the L3 transverse process and continued on both sides down to the lumbosacral junction.  Viper retractor was used for the upper part of the incision. Spinous processes of L3 L4-L5 were then resected down to the base the lamina at each segment the lower 50% of the spinous process of L2 was resected and Leksell rongeur used to resect inferior aspect of the lamina on the left side at the L2 level and partially on the right  side at L2. The left inferior articular process of L3 and L4 were resected in order to provide for exposure of the righ t side L3-4 and L4-5 neuroforamen for ease of placement of TLIFs (transforaminal lumbar interbody fusion) essential  portions of the lamina were also resected first beginning with the Leksell rongeur and then resecting using 2 and 3 mm Kerrison.Laminectomy was carried out resecting the central portions of the lamina of L3 and L4 performing foraminotomies on the right side at the L5 level. The inferior articular process L3 and L4 were resected on the right side. The L5 nerve root identified bilaterally and the medial aspect of the L5 pedicle. Superior articular process of L5 was then resected from the right side further decompressing the right L4 nerve and providing for exposure of the area just superior to the L5 pedicle for a placement of cage. Returning to the left side decompression was carried out along the right side recut resecting the superior articular process of L3 overlying the L3 nerve root as it exited at the L3-4 level decompressing the lateral recess along the medial aspect of the pedicle of L3 and resecting the superior to the process of L4 and decompressing the right  L3 neuroforamen. Loupe magnification and headlight were used during this portion procedure. At the L5 level similarly lateral recess and the neuroforamen were resected decompressing the L4 ad L5 nerve roots and foraminotomies was widely performed over the left L3 nerve and L4 nerve roots.C-arm fluoroscopy was then brought into the field and using C-arm fluoroscopy then a hole made into the lateral aspect of the pedicle of L3 observed in the pedicle using ball-tipped nerve hook and hockey stick nerve probe initial entry was determined on fluoroscopy to be good position alignment so that a ball handled probe was then used to probe the left L3 pedicle to a depth of nearly 40 mm observed on C-arm fluoroscopy to be beyond the midpoint of the lumbar vertebra and then position alignment within the left L3 pedicle this was then removed and the pedicle channel probed demonstrating patency no sign of rupture the cortex of the pedicle. Tapping with a 5 mm screw  then 6.0 mm x 40 mm screw was placed on the left side at the L3 level. C-arm fluoroscopy was then brought into the field and using C-arm fluoroscopy then a hole made into the lateral aspect of the pedicle of L4 observed in the pedicle using ball tipped nerve hook and hockey stick nerve probe initial entry was determined on fluoroscopy to be good position alignment so that a ball handled probe was then used to probe the left L4 pedicle to a depth of nearly 35 mm observed on C-arm fluoroscopy to be beyond the midpoint of the lumbar vertebra and then position alignment within the left L4 pedicle this was then removed and the pedicle channel probed demonstrating patency no sign of rupture the cortex of the pedicle. Tapping with a 6 mm screw tap  then 7.0 mm x 35 mm screw was placed on the left side at the L4 level C-arm fluoroscopy was then brought into the field and using C-arm fluoroscopy then a hole made into the lateral aspect of the pedicle of L5 observed in the pedicle using ball tipped nerve hook and hockey stick nerve probe initial entry was determined on fluoroscopy to be good position alignment so that a ball handled probe was then used to probe the left L5 pedicle to a depth of nearly  35 mm observed on C-arm fluoroscopy to be well aligned within the left L5 pedicle, the pedicle channel probed demonstrating patency no sign of rupture the cortex of the pedicle. Tapping with a 6 mm screw then 7.0 mm x 35 mm screw was placed on the left side at the L5 level.  C-arm fluoroscopy was used to localize the hole made in the lateral aspect of the pedicle of L3 on the right localizing the pedicle within the spinal canal with nerve hook and hockey-stick nerve probe carefully passed down the center of the L3 pedicle to a depth of nearly 35 mm. Observed on C-arm fluoroscopy to be in good position alignment channel was probed with a ball-tipped probe ensure patency no sign of cortical disruption. Following tapping with a 5  mm 10 and a 6.0 x 40 mm screw was placed on the right side pedicle at L3.C-arm fluoroscopy was used to localize the hole made in the lateral aspect of the pedicle of L4 on the right localizing the pedicle within the spinal canal with nerve hook and hockey-stick nerve probe carefully passed down the center of the L4 pedicle to a depth of nearly 40 mm. Observed on C-arm fluoroscopy to be in good position alignment channel was probed with a ball-tipped probe ensure patency no sign of cortical disruption. Following tapping with a 5 mm tap and a 7.0 x 40 mm screw was placed on the right side pedicle at L3. C-arm fluoroscopy was used to localize the hole made in the lateral aspect of the pedicle of L5 on the right localizing the pedicle within the spinal canal with nerve hook and hockey-stick nerve probe carefully passed down the center of the L5 pedicle to a depth of nearly 35 mm. Observed on C-arm fluoroscopy to be in good position alignment channel was probed with a ball-tipped probe ensure patency no sign of cortical disruption. Following tapping with a 6 mm tap and a 7.0 x 35 mm screw was placed on the right side pedicle at L5. Attention then turned to placement of the transforaminal lumbar interbody fusion cages. Using a Penfield 4 the lateral aspect of the thecal sac and the inferior aspect of theL4 nerve root on the right side at the L4-5 level was carefully drilled. The thecal sac could then easily be retracted in the posterior lateral aspect of the L4-5 disc was exposed 15 blade scalpel used to incise the osteotome used to resect a small portion of bone off the superior aspect of the posterior superior vertebral body of L5 in order to ease the entry into the L4-5 disc space. A pituitary rongeur was then able to be introduced in the disc space debrided it of degenerative disc material. 7 mm dilator was used to dialatethe L4-5 disc space on the right side attempts were made to dilate further in intrements to 78mm  successfully and using small curettes and the disc space was debrided a minimal degenerative disc present in the endplates debrided to bleeding endplate bone. A 11 mm parallel Concorde cages and careful packed with morcellized bone graft and the been harvested from previous laminotomies additional bone graft was then packed into the intervertebral disc space using the 8 mm trial impacted the graft multiple times. With this then a 11 mm lorditic cage was introdudced into the disc space on the right side in the correct degree convergence and then impacted then subset beneath the posterior aspect of the disc space by about 3 or 4 mm. Bleeding  controlled using bipolar electrocautery thrombin soaked gel cottonoids. Then turned to the right L3-4 level similarly the exposure the posterior lateral aspect this was carried out using a Penfield 4 bipolar electrocautery to control small bleeders present. Derricho retractor used to retract the thecal sac and nerve root, a 15 blade scalpel was used to incise posterior lateral aspect of the disc at the L3-4 disc space.The space was debrided of degenerative disc material using pituitary along root the entire disc space was then debrided of degenerative disc material using pituitary rongeurs curettage down to bleeding bone endplates. Residual disc was resected using pituitary. This space was then carefully sounded to a 11 mm cage trial provided the best fit the lordotic cage was chosen the 11 mm x 23 mm. The intervertebral disc space was then packed with autogenous local bone graft that been harvested from the central laminectomy and the trial was used to pack  the graft using an 8 mm trial cage apparatus. This provided excellent bone graft within the intervertebral disc space at L3-4 so that the permanent 11 mm cage by 23 mm was then packed with local bone graft placed into the intervertebral disc space and impacted into place in the correct degree of convergence. Bleeding  controlled using bipolar electrocautery. The cage was subset beneath the posterior aspect of the about 3-4 mm . Bleeding was hemostasis attention was turned to the left side where on the left side portion of the central laminectomy at the L2-3 and L3-4 levels carefully decompressing thecal sac and the appropriate nerve root L2, L3 and L4 at the L3-4 and L4-5.l Observed on C-arm fluoroscopy to be in good position alignment. With this then the transforaminal lumbar interbody fusion portion of the case was completed bleeders were controlled using bipolar electrocautery thrombin-soaked Gelfoam were appropriate. 3 screws on the left and 3 screws on the right and then each carefully aligned and tightened or loose and a slight amount to allow for placement of rods. The rod was then placed into the pedicle screws on the left extending from L3-5  each of the caps carefully placed loosely tightened. Attention turned to the right side were similarly and then screws were carefully adjusted to allow for a better pattern screws to allow for placement of fixation rod template for the rod was then taken and a precontoured quarter inch titanium rod was placed.  The right L3 rod fastener cap was then tightened 85 pounds similarly on  At the right side disc space at L3-4 and L4-5 screw fasteners compression was obtained on the right side between L3 and  L4. And at L4 and L5 by compressing between the facets  right hand the screw caps tightened to 85 pounds. Similarly this was done on the left side at L3 and L4 screws were slightly compressed and tightened 85 pounds. Returning then to the L4-5 level compression was obtained The screw L5  fasteners caps and the L4 fasteners caps were tightened to 80 foot-pounds tIirrigation was carried out with copious amounts of saline solution this was done throughout the case. Cell Saver was used during the case. A single cross-link for the Depuy system was placed at the L4-5 level measured with the  measuring tool and the appropriate A7 cross-link was then carefully applied to the rods and tightened again to 80 foot-pounds bilaterally using the appropriate torque screwdriver. Center screw on the cross-link was then carefully tightened 80 foot-pounds irrigation was carried out observation showed no dural tear demonstrated no  leakage present. Permanent C-arm images were obtained in AP and lateral plane. Remaining local bone graft was then applied along the left and right lateral posterior lateral region extending from L3 through L5 transverse processes. Excess Gelfoam was then removed the lumbodorsal musculature carefully exam debrided of any devitalized tissue following removal of Vicryl retractors were the bleeders were controlled using electrocautery and the area dorsal lumbar muscle were then approximated in the midline with interrupted #1 Vicryl sutures loose the dorsal fascia was re\re attached to the spinous process of L2 to superiorly and asked to history inferiorly this was done with #1 Vicryl sutures. A Hemovac drain was placed subcutaneously and subcutaneous layers then approximated over the drain using interrupted 0 Vicryl sutures and 2-0 Vicryl sutures. Skin was closed with a running subcutaneous stitch of 4-0 Vicryl Dermabond was applied then MedPlex bandage. All instrument and sponge counts were correct. The patient was then returned to a supine position on her bed reactivated extubated and returned to the recovery room in satisfactory condition. Phillips Hay PA-C for the duties of assistant surgeon throughout this case she assisted loupe magnification retracting delicate neural structures suctioning over the counter neural structures throughout the case bilaterally assisted with the placement of instrumentation. She was present from the beginning of the case to the end of the case. Assisted in positioning the patient having in positioning the arm and legs. She also participated in removal the  patient from the operating table.              Raziel Koenigs E  04/30/2012, 1:04 PM

## 2012-05-01 LAB — CBC
HCT: 29.6 % — ABNORMAL LOW (ref 36.0–46.0)
MCH: 29.3 pg (ref 26.0–34.0)
MCV: 88.4 fL (ref 78.0–100.0)
RBC: 3.35 MIL/uL — ABNORMAL LOW (ref 3.87–5.11)
WBC: 13.4 10*3/uL — ABNORMAL HIGH (ref 4.0–10.5)

## 2012-05-01 LAB — URINALYSIS, ROUTINE W REFLEX MICROSCOPIC
Bilirubin Urine: NEGATIVE
Hgb urine dipstick: NEGATIVE
Protein, ur: NEGATIVE mg/dL
Urobilinogen, UA: 0.2 mg/dL (ref 0.0–1.0)

## 2012-05-01 LAB — URINE MICROSCOPIC-ADD ON

## 2012-05-01 LAB — GLUCOSE, CAPILLARY
Glucose-Capillary: 140 mg/dL — ABNORMAL HIGH (ref 70–99)
Glucose-Capillary: 152 mg/dL — ABNORMAL HIGH (ref 70–99)

## 2012-05-01 LAB — BASIC METABOLIC PANEL
BUN: 13 mg/dL (ref 6–23)
CO2: 22 mEq/L (ref 19–32)
Chloride: 106 mEq/L (ref 96–112)
Creatinine, Ser: 0.59 mg/dL (ref 0.50–1.10)

## 2012-05-01 MED ORDER — OXYCODONE HCL ER 10 MG PO T12A
20.0000 mg | EXTENDED_RELEASE_TABLET | Freq: Two times a day (BID) | ORAL | Status: DC
  Administered 2012-05-01 – 2012-05-05 (×9): 20 mg via ORAL
  Filled 2012-05-01 (×9): qty 2

## 2012-05-01 NOTE — Evaluation (Signed)
Physical Therapy Evaluation Patient Details Name: [name] MRN: [id number] DOB: 1950/05/16 Today's Date: 05/01/2012 Time: [id number] PT Time Calculation (min): 22 min  PT Assessment / Plan / Recommendation Clinical Impression  [name] is a 62 y/o female s/p two level lumbar fusion who presents with decreased mobility due to acute pain, decreased activity tolerance, decreased LE strength and will benefit from skilled PT in the acute setting to maximize independence and allow safe return home with family assist and HHPT.    PT Assessment  Patient needs continued PT services    Follow Up Recommendations  Home health PT;Supervision for mobility/OOB          Equipment Recommendations  None recommended by PT       Frequency Min 6X/week    Precautions / Restrictions Precautions Precautions: Back Precaution Booklet Issued: Yes (comment) Precaution Comments: Pt provded with back precautions handout. Pt  and her dtr educated on back precautions Required Braces or Orthoses: Spinal Brace Spinal Brace: Lumbar corset;Applied in sitting position Restrictions Weight Bearing Restrictions: No   Pertinent Vitals/Pain 8/10 in back      Mobility  Bed Mobility Bed Mobility: Sit to Sidelying Left;Rolling Right Rolling Right: 5: Supervision Rolling Left: 4: Min assist;With rail Left Sidelying to Sit: 3: Mod assist;HOB elevated;With rails Supine to Sit: 3: Mod assist Sitting - Scoot to Edge of Bed: 4: Min assist Sit to Sidelying Left: 3: Mod assist;HOB flat Details for Bed Mobility Assistance: sit to left sidelying without rail with assist for both legs in bed and rolled to supine with supervision after legs placed Transfers Sit to Stand: 3: Mod assist;With armrests;From chair/3-in-1 Stand to Sit: To bed;With upper extremity assist;4: Min assist Details for Transfer Assistance: cues for precautions Ambulation/Gait Ambulation/Gait Assistance: 4: Min assist Ambulation Distance (Feet):  8 Feet Assistive device: Rolling walker Ambulation/Gait Assistance Details: chair to bed with pain in right LE with weight bearing Gait Pattern: Step-to pattern;Decreased stance time - right;Shuffle;Decreased trunk rotation           PT Diagnosis: Difficulty walking;Generalized weakness;Acute pain  PT Problem List: Decreased strength;Decreased activity tolerance;Decreased balance;Decreased mobility;Decreased knowledge of precautions;Pain PT Treatment Interventions: DME instruction;Gait training;Stair training;Functional mobility training;Therapeutic activities;Therapeutic exercise;Patient/family education   PT Goals Acute Rehab PT Goals PT Goal Formulation: With patient Time For Goal Achievement: 05/08/12 Potential to Achieve Goals: Good Pt will go Supine/Side to Sit: with supervision PT Goal: Supine/Side to Sit - Progress: Goal set today Pt will go Sit to Supine/Side: with supervision PT Goal: Sit to Supine/Side - Progress: Goal set today Pt will go Sit to Stand: with supervision PT Goal: Sit to Stand - Progress: Goal set today Pt will go Stand to Sit: with supervision PT Goal: Stand to Sit - Progress: Goal set today Pt will Ambulate: 51 - 150 feet;with rolling walker;with supervision PT Goal: Ambulate - Progress: Goal set today Pt will Go Up / Down Stairs: 3-5 stairs;with rail(s);with min assist PT Goal: Up/Down Stairs - Progress: Goal set today Additional Goals Additional Goal #1: Patient to verbalize back precautions independently. PT Goal: Additional Goal #1 - Progress: Goal set today  Visit Information  Last PT Received On: 05/01/12    Subjective Data  Subjective: I'm not nauseous right now Patient Stated Goal: To improve pain and go home   Prior [place] Lives With: Spouse Available Help at Discharge: Family;Other (Comment) (may be at home alone approx 3 hrs/day) Type of Home: House Home Access: Stairs to enter [employer]  of Steps:  4 Entrance Stairs-Rails: Left Home Layout: One level Bathroom Shower/Tub: Multimedia programmer: Standard Bathroom Accessibility: Yes How Accessible: Accessible via walker Home Adaptive Equipment: Straight cane;Walker - standard Prior Function Level of Independence: Independent Able to Take Stairs?: Yes Driving: Yes Vocation: Retired Corporate investment banker: No difficulties Dominant Hand: Right    Cognition  Overall Cognitive Status: Appears within functional limits for tasks assessed/performed Arousal/Alertness: Awake/alert Orientation Level: Appears intact for tasks assessed Behavior During Session: [facility] for tasks performed    Extremity/Trunk Assessment Right Upper Extremity Assessment RUE ROM/Strength/Tone: [facility] for tasks assessed Left Upper Extremity Assessment LUE ROM/Strength/Tone: WFL for tasks assessed Right Lower Extremity Assessment RLE ROM/Strength/Tone: Deficits RLE ROM/Strength/Tone Deficits: hip flexion 3+/5 with pain on testing; knee extension 4+/5; ankle DF at least 3/5 RLE Sensation: Deficits RLE Sensation Deficits: 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) Left Lower Extremity Assessment LLE ROM/Strength/Tone: Deficits LLE ROM/Strength/Tone Deficits: hip flexion 4-/5 with pain on testing; knee extension 4+/5; ankle DF at least 3/5 LLE Sensation: Deficits LLE Sensation Deficits: 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

## 2012-05-01 NOTE — Progress Notes (Signed)
Utilization review completed.  

## 2012-05-01 NOTE — Evaluation (Signed)
Occupational Therapy Evaluation Patient Details Name: [name] MRN: [id number] DOB: 11-14-1950 Today's Date: 05/01/2012 Time: 1115-1209 OT Time Calculation (min): 54 min  OT Assessment / Plan / Recommendation Clinical Impression       OT Assessment  Patient needs continued OT Services    Follow Up Recommendations  Home health OT    Barriers to Discharge None;Other (comment) (may be home alone approx 3 hrs when dtr returns to work) Pt's dtr plans to take 1 - 2 weeks off to stay with pt after hospital d/c. Pt's husband works night shift. When dtr returns to work, pt will be home alone apporx 3 hrs/day  Equipment Recommendations  3 in 1 bedside comode;Tub/shower seat    Recommendations for Other Services    Frequency  Min 2X/week    Precautions / Restrictions Precautions Precautions: Back Precaution Booklet Issued: Yes (comment) Precaution Comments: Pt provded with back precautions handout. Pt  and her dtr educated on back precautions Required Braces or Orthoses: Spinal Brace Spinal Brace: Applied in sitting position;Lumbar corset Restrictions Weight Bearing Restrictions: No   Pertinent Vitals/Pain     ADL  Eating/Feeding: Performed;Set up Where Assessed - Eating/Feeding: Chair Grooming: Performed;Wash/dry hands;Set up Where Assessed - Grooming: Supported sitting Upper Body Bathing: Simulated;Supervision/safety;Set up Where Assessed - Upper Body Bathing: Supported sitting Lower Body Bathing: Maximal assistance Where Assessed - Lower Body Bathing: Supported sitting Upper Body Dressing: Performed;Supervision/safety;Set up;Other (comment) (total A to donn back brace) Lower Body Dressing: +1 Total assistance Where Assessed - Lower Body Dressing: Supported sitting;Supported standing Toilet Transfer: Simulated;Moderate assistance Toilet Transfer Method: Other (comment) (ambulating from RW level) Toileting - Clothing Manipulation and Hygiene: Maximal  assistance;Performed Equipment Used: Back brace;Rolling walker;Sock aid;Gait belt;Long-handled shoe horn;Long-handled sponge;Reacher Transfers/Ambulation Related to ADLs: Pt required cues for correst hand placement for sit - stand, stand - sit transitions from bed to chair. Pt required cues to keep walker close to her and for reaching back one hand at a time for descent into chair ADL Comments: pt and her dtr provided with education and demo for ADL A/E for use at home, educated on safety and use of shower chair and 3 in 1    OT Diagnosis: Generalized weakness  OT Problem List: Decreased strength;Decreased knowledge of use of DME or AE;Impaired balance (sitting and/or standing);Decreased activity tolerance;Decreased knowledge of precautions;Pain OT Treatment Interventions: Self-care/ADL training;Therapeutic activities;Therapeutic exercise;Neuromuscular education;DME and/or AE instruction;Patient/family education;Balance training   OT Goals Acute Rehab OT Goals OT Goal Formulation: With patient/family Time For Goal Achievement: 05/01/12 Potential to Achieve Goals: Good ADL Goals Pt Will Perform Grooming: Standing at sink;with min assist;with mod assist ADL Goal: Grooming - Progress: Goal set today Pt Will Perform Lower Body Bathing: with mod assist;with adaptive equipment ADL Goal: Lower Body Bathing - Progress: Goal set today Pt Will Perform Lower Body Dressing: with mod assist;with adaptive equipment ADL Goal: Lower Body Dressing - Progress: Goal set today Pt Will Transfer to Toilet: with min assist;with DME;Grab bars ADL Goal: Toilet Transfer - Progress: Goal set today Pt Will Perform Toileting - Clothing Manipulation: with mod assist;with min assist;Standing ADL Goal: Toileting - Clothing Manipulation - Progress: Goal set today Pt Will Perform Toileting - Hygiene: with mod assist;with min assist;Sit to stand from 3-in-1/toilet;Standing at 3-in-1/toilet ADL Goal: Toileting - Hygiene -  Progress: Goal set today Pt Will Perform Tub/Shower Transfer: Shower transfer;with DME;Grab bars ADL Goal: Tub/Shower Transfer - Progress: Goal set today  Visit Information  Last OT Received On: 05/01/12  Subjective Data  Subjective: " I have never felt any pain like this " Patient Stated Goal: To return home   Prior [place] With: Spouse Available Help at Discharge: Family;Other (Comment) (may be at home alone approx 3 hrs/day) Type of Home: House Home Access: Stairs to enter [employer] of Steps: 4 Entrance Stairs-Rails: Left Home Layout: One level Bathroom Shower/Tub: Multimedia programmer: Standard Bathroom Accessibility: Yes How Accessible: Accessible via walker Home Adaptive Equipment: Straight cane;Walker - standard Prior Function Level of Independence: Independent Able to Take Stairs?: Yes Driving: Yes Vocation: Retired Corporate investment banker: No difficulties Dominant Hand: Right         Vision/Perception Vision - Assessment Eye Alignment: Within Designer, television/film set Perception: Within Functional Limits   Cognition  Overall Cognitive Status: Appears within functional limits for tasks assessed/performed Arousal/Alertness: Awake/alert Orientation Level: Appears intact for tasks assessed Behavior During Session: [facility] for tasks performed    Extremity/Trunk Assessment Right Upper Extremity Assessment RUE ROM/Strength/Tone: [facility] for tasks assessed Left Upper Extremity Assessment LUE ROM/Strength/Tone: WFL for tasks assessed     Mobility Bed Mobility Bed Mobility: Rolling Left;Left Sidelying to Sit;Supine to Sit;Sitting - Scoot to Edge of Bed Rolling Left: 4: Min assist;With rail Left Sidelying to Sit: 3: Mod assist;HOB elevated;With rails Supine to Sit: 3: Mod assist Sitting - Scoot to Edge of Bed: 4: Min assist Transfers Transfers: Sit to Stand;Stand to Sit Sit to Stand: 3: Mod  assist Stand to Sit: 3: Mod assist     Shoulder Instructions     Exercise     Balance Balance Balance Assessed: No   End of Session OT - End of Session Equipment Utilized During Treatment: Gait belt;Back brace;Other (comment) (RW) Activity Tolerance: Patient tolerated treatment well;Patient limited by pain Patient left: in chair;with call bell/phone within reach;with family/visitor present;with nursing in room  GO     Britt Bottom 05/01/2012, 12:52 PM

## 2012-05-01 NOTE — Progress Notes (Signed)
Subjective: 1 Day Post-Op Procedure(s) (LRB): POSTERIOR LUMBAR FUSION 2 LEVEL (N/A) Complains of stomach upset, nausea, vomited x1. Patient reports pain as moderate.    Objective:   VITALS:  Temp:  [98 F (36.7 C)-98.7 F (37.1 C)] 98.7 F (37.1 C) (01/21 0600) Pulse Rate:  [77-95] 92  (01/21 0600) Resp:  [13-20] 13  (01/21 1550) BP: (76-99)/(37-58) 96/55 mmHg (01/21 0600) SpO2:  [98 %-100 %] 100 % (01/21 1550)  Neurologically intact ABD soft Sensation intact distally Intact pulses distally Dorsiflexion/Plantar flexion intact Abdomen is soft mild discomfort both lower quadrants. Areas where OR table pads would rest against the sides. Patient indicates that she has a previous diagnosis of diverticulosis Diagnosed in the last 3 month. Also voiding in small amounts since foley d/ced this am.  LABS  Basename 05/01/12 0600  HGB 9.8*  WBC 13.4*  PLT 148*    Basename 05/01/12 0600  NA 138  K 4.2  CL 106  CO2 22  BUN 13  CREATININE 0.59  GLUCOSE 143*   No results found for this basename: LABPT:2,INR:2 in the last 72 hours   Assessment/Plan: 1 Day Post-Op Procedure(s) (LRB): POSTERIOR LUMBAR FUSION 2 LEVEL (N/A) Lower abdomen pain. R/O urinary retension r/o ileus, r/o diverticulosis  Plan: Up with therapy Check bladder scan if greater than 150cc urine then place foley to SD. Will check KUB portable to rule out other cause of abdomenal pain. Recheck CBC, CMET. Urinalysis if foley necessary.  Harris Kistler E 05/01/2012, 4:03 PM

## 2012-05-01 NOTE — Progress Notes (Signed)
Patient has complained of laying in pain all night. Morphine PCA dropped BP extremely low last night, MD made aware and boluses given. Po pain meds given as an alternative to the Morphine. Patient does not have to use the Morphine with fear of her BP dropping, will continue PO meds for pain control this morning and make MD aware of possibility of discontinuing PCA.

## 2012-05-01 NOTE — Progress Notes (Signed)
Bladder scanned performed. 273ml of urine shown. Foley Catheter inserted per Dr. Louanne Skye with return. Will continue to monitor.

## 2012-05-01 NOTE — Progress Notes (Signed)
Subjective: 1 Day Post-Op Procedure(s) (LRB): POSTERIOR LUMBAR FUSION 2 LEVEL (N/A) Awake, alert, Ox4. It hurts. Patient reports pain as moderate.    Objective:   VITALS:  Temp:  [97.6 F (36.4 C)-98.7 F (37.1 C)] 98.7 F (37.1 C) (01/21 0600) Pulse Rate:  [65-95] 92  (01/21 0600) Resp:  [11-20] 20  (01/21 0750) BP: (76-125)/(37-80) 96/55 mmHg (01/21 0600) SpO2:  [96 %-100 %] 99 % (01/21 0750)  Neurologically intact ABD soft Neurovascular intact Sensation intact distally Intact pulses distally Dorsiflexion/Plantar flexion intact Incision: dressing C/D/I Hemovac d/ced Foley out this am. Brace in the room. Decreased BP with pain meds.  LABS  Basename 05/01/12 0600  HGB 9.8*  WBC 13.4*  PLT 148*    Basename 05/01/12 0600  NA 138  K 4.2  CL 106  CO2 22  BUN 13  CREATININE 0.59  GLUCOSE 143*   No results found for this basename: LABPT:2,INR:2 in the last 72 hours   Assessment/Plan: 1 Day Post-Op Procedure(s) (LRB): POSTERIOR LUMBAR FUSION 2 LEVEL (N/A) Peri op acute blood loss anemia  Up with therapy Discharge home with home health when stable. Oxycontin SR to give baseline pain control.  [name] E 05/01/2012, 8:02 AM

## 2012-05-01 NOTE — Plan of Care (Signed)
Problem: Phase II Progression Outcomes Goal: Discharge plan established Recommend HH OT for ADL and ADL mobility safety,  RW, shower chair and 3 in 1 after acute care d/c

## 2012-05-01 NOTE — Progress Notes (Signed)
Pt reports 9/10 back pain this morning and that she did not sleep well last not due to being in pain. Pt requests that OT return later  Mosetta Putt  OT

## 2012-05-02 ENCOUNTER — Inpatient Hospital Stay (HOSPITAL_COMMUNITY): Payer: Medicare Other

## 2012-05-02 LAB — CBC
HCT: 25.1 % — ABNORMAL LOW (ref 36.0–46.0)
MCHC: 33.5 g/dL (ref 30.0–36.0)
MCV: 88.7 fL (ref 78.0–100.0)
Platelets: 157 10*3/uL (ref 150–400)
RDW: 13.7 % (ref 11.5–15.5)

## 2012-05-02 LAB — GLUCOSE, CAPILLARY
Glucose-Capillary: 145 mg/dL — ABNORMAL HIGH (ref 70–99)
Glucose-Capillary: 129 mg/dL — ABNORMAL HIGH (ref 70–99)
Glucose-Capillary: 134 mg/dL — ABNORMAL HIGH (ref 70–99)

## 2012-05-02 LAB — URINE CULTURE: Culture: NO GROWTH

## 2012-05-02 MED ORDER — BISACODYL 10 MG RE SUPP
10.0000 mg | Freq: Once | RECTAL | Status: DC
  Filled 2012-05-02: qty 1

## 2012-05-02 MED ORDER — BETHANECHOL CHLORIDE 25 MG PO TABS
25.0000 mg | ORAL_TABLET | Freq: Three times a day (TID) | ORAL | Status: DC
  Administered 2012-05-02 – 2012-05-05 (×8): 25 mg via ORAL
  Filled 2012-05-02 (×10): qty 1

## 2012-05-02 MED ORDER — SENNA 8.6 MG PO TABS
2.0000 | ORAL_TABLET | Freq: Every day | ORAL | Status: DC
  Administered 2012-05-02 – 2012-05-05 (×4): 17.2 mg via ORAL
  Filled 2012-05-02 (×4): qty 2

## 2012-05-02 MED ORDER — METOCLOPRAMIDE HCL 5 MG/ML IJ SOLN
5.0000 mg | Freq: Four times a day (QID) | INTRAMUSCULAR | Status: DC
  Administered 2012-05-02 – 2012-05-05 (×12): 5 mg via INTRAVENOUS
  Filled 2012-05-02: qty 1
  Filled 2012-05-02 (×2): qty 2
  Filled 2012-05-02 (×7): qty 1
  Filled 2012-05-02: qty 2
  Filled 2012-05-02 (×4): qty 1

## 2012-05-02 MED FILL — Sodium Chloride IV Soln 0.9%: INTRAVENOUS | Qty: 1000 | Status: AC

## 2012-05-02 MED FILL — Sodium Chloride Irrigation Soln 0.9%: Qty: 3000 | Status: AC

## 2012-05-02 MED FILL — Heparin Sodium (Porcine) Inj 1000 Unit/ML: INTRAMUSCULAR | Qty: 30 | Status: AC

## 2012-05-02 NOTE — Progress Notes (Addendum)
Patient ID: [name], female   DOB: 1950-11-18, 62 y.o.   MRN: [id number] Subjective: 2 Days Post-Op Procedure(s) (LRB): POSTERIOR LUMBAR FUSION 2 LEVEL (N/A) Awake, alert, Oriented x 4. Complains of bilateral lower quadrant abdomenal discomfort. Had urinary retension and required foley catheter replaced. Will start urecholine and Trial dc foley on Friday with less narcotic medications. Was treated for diverticulosis In the past, urinary retension not usually due to diverticulosis but narcotics. She is not Showing return of bowel function as yet and this is likely ileus. Patient reports pain as moderate.    Objective:   VITALS:  Temp:  [99 F (37.2 C)-100.5 F (38.1 C)] 100.5 F (38.1 C) (01/22 1249) Pulse Rate:  [85-119] 105  (01/22 1249) Resp:  [16-18] 17  (01/22 1249) BP: (75-104)/(49-61) 103/49 mmHg (01/22 1249) SpO2:  [94 %-100 %] 94 % (01/22 1249)  Neurologically intact ABD soft Sensation intact distally Dorsiflexion/Plantar flexion intact Incision: dressing C/D/I   LABS  Basename 05/02/12 0900 05/01/12 0600  HGB 8.4* 9.8*  WBC 16.0* 13.4*  PLT 157 148*    Basename 05/01/12 0600  NA 138  K 4.2  CL 106  CO2 22  BUN 13  CREATININE 0.59  GLUCOSE 143*   No results found for this basename: LABPT:2,INR:2 in the last 72 hours   Assessment/Plan: 2 Days Post-Op Procedure(s) (LRB): POSTERIOR LUMBAR FUSION 2 LEVEL (N/A) Abdomenal discomfort, history of recent bout of diverticulosis, now discomfort likely ileus will eval with KUB And CXR as she is showing low grade fever and abdomenal discomfort.  Portable CXR and KUB to evaluate abdomen. Up with therapy Discharge home with home health versus  Discharge to SNF Check AM cortisol due to history of previous multiple epidural steroids. AM CBC, CMET Urecholine for retention. Reglan, senna and dulcolax to stimulate bowel functionl. Pulmonary toilet.  Vantasia Pinkney E 05/02/2012, 7:35 PM

## 2012-05-02 NOTE — Progress Notes (Signed)
Agree with PTA.    Elara Cocke, PT [phone]  

## 2012-05-02 NOTE — Progress Notes (Signed)
Pts family again addressed concerns about pts abdominal pain and wanted MD Louanne Skye to consult primary doctor about possibly getting and abdominal xray or CT. Dr Louanne Skye is aware of the family concerns.

## 2012-05-02 NOTE — Progress Notes (Signed)
Pt continues to c/o lower abdominal pain. Describes the pain as a sharp constant ache. Bowel sounds present in 4 quadrants. Pt passing flatus. Abdomen is soft and tender to touch.

## 2012-05-02 NOTE — Progress Notes (Addendum)
Physical Therapy Treatment Patient Details Name: [name] MRN: [id number] DOB: 1950/10/06 Today's Date: 05/02/2012 Time: [id number] PT Time Calculation (min): 26 min  PT Assessment / Plan / Recommendation Comments on Treatment Session  Patient severly limited with mobility and safety this session. Patient is not progressing well and has complaints of radiating pain from back to right leg. Patients husband works and patient has limited assistance at home. At this time I recommend STSNF for rehab as patient unsafe for home due to lmiited mobility and need for at least Mod assistance at this time    Follow Up Recommendations  Home health PT;SNF;Supervision/Assistance - 24 hour (HHPT only if patient can progress to supervision level )     Does the patient have the potential to tolerate intense rehabilitation     Barriers to Discharge        Equipment Recommendations  None recommended by PT    Recommendations for Other Services    Frequency Min 5X/week   Plan Discharge plan needs to be updated;Frequency remains appropriate    Precautions / Restrictions Precautions Precautions: Back Precaution Comments: Patient reeducated on 3/3 back precautions and required max cues to maintain with mobility Required Braces or Orthoses: Spinal Brace Spinal Brace: Lumbar corset;Applied in sitting position   Pertinent Vitals/Pain     Mobility  Bed Mobility Rolling Left: 3: Mod assist;With rail Left Sidelying to Sit: 3: Mod assist;With rails Details for Bed Mobility Assistance: Patient required Mod A to roll hips and position trunk and shoulders into upright positioning. Cues for technique and sequency Transfers Sit to Stand: 3: Mod assist;With upper extremity assist;From bed;From chair/3-in-1 Stand to Sit: 3: Mod assist;With upper extremity assist;To chair/3-in-1 Details for Transfer Assistance: A to initiate stand and to attempt to stand upright. Patient with flexed knees and trunk in  standing. Max cues for technique. Uncontrolled sit to recliner due to pain and fatique Ambulation/Gait Ambulation/Gait Assistance: 3: Mod assist Ambulation Distance (Feet): 6 Feet Assistive device: Rolling walker Ambulation/Gait Assistance Details: Patient able to take small steps at first but became very fatiqued with complaints of increased pain. Patient attempt to sit without chair behind her and Mod A for balance while recliner was bought to patient. Patient with increased flexed patient and unsafe with use of RW Gait Pattern: Step-to pattern;Decreased stride length;Decreased step length - right;Decreased step length - left;Narrow base of support;Trunk flexed Gait velocity: decreased    Exercises     PT Diagnosis:    PT Problem List:   PT Treatment Interventions:     PT Goals Acute Rehab PT Goals PT Goal: Supine/Side to Sit - Progress: Not progressing PT Goal: Sit to Stand - Progress: Not progressing PT Goal: Stand to Sit - Progress: Not progressing PT Goal: Ambulate - Progress: Not progressing Additional Goals PT Goal: Additional Goal #1 - Progress: Not progressing  Visit Information  Last PT Received On: 05/02/12 Assistance Needed: +1    Subjective Data      Cognition  Overall Cognitive Status: Appears within functional limits for tasks assessed/performed Arousal/Alertness: Awake/alert Orientation Level: Appears intact for tasks assessed Behavior During Session: [facility] for tasks performed    Balance     End of Session PT - End of Session Equipment Utilized During Treatment: Gait belt;Back brace Activity Tolerance: Patient limited by fatigue;Patient limited by pain Patient left: in chair;with call bell/phone within reach Nurse Communication: Mobility status   GP     Jacqualyn Posey 05/02/2012, 11:53 AM 05/02/2012 [place], Tonia Brooms  PTA [id number] pager [phone] office

## 2012-05-03 LAB — CBC WITH DIFFERENTIAL/PLATELET
Basophils Absolute: 0 10*3/uL (ref 0.0–0.1)
Eosinophils Relative: 1 % (ref 0–5)
Lymphocytes Relative: 17 % (ref 12–46)
MCV: 87.3 fL (ref 78.0–100.0)
Platelets: 190 10*3/uL (ref 150–400)
RDW: 13.5 % (ref 11.5–15.5)
WBC: 13.5 10*3/uL — ABNORMAL HIGH (ref 4.0–10.5)

## 2012-05-03 LAB — GLUCOSE, CAPILLARY: Glucose-Capillary: 97 mg/dL (ref 70–99)

## 2012-05-03 LAB — COMPREHENSIVE METABOLIC PANEL
Albumin: 2.3 g/dL — ABNORMAL LOW (ref 3.5–5.2)
Alkaline Phosphatase: 58 U/L (ref 39–117)
BUN: 7 mg/dL (ref 6–23)
Calcium: 9 mg/dL (ref 8.4–10.5)
Potassium: 4.1 mEq/L (ref 3.5–5.1)
Total Protein: 5.8 g/dL — ABNORMAL LOW (ref 6.0–8.3)

## 2012-05-03 MED ORDER — MUPIROCIN 2 % EX OINT
TOPICAL_OINTMENT | Freq: Two times a day (BID) | CUTANEOUS | Status: DC
  Administered 2012-05-03 – 2012-05-05 (×5): via TOPICAL
  Filled 2012-05-03: qty 22

## 2012-05-03 MED ORDER — GABAPENTIN 100 MG PO CAPS
100.0000 mg | ORAL_CAPSULE | Freq: Every day | ORAL | Status: DC
  Administered 2012-05-03 – 2012-05-04 (×2): 100 mg via ORAL
  Filled 2012-05-03 (×4): qty 1

## 2012-05-03 MED ORDER — BISACODYL 10 MG RE SUPP
10.0000 mg | Freq: Once | RECTAL | Status: AC
  Administered 2012-05-03: 10 mg via RECTAL

## 2012-05-03 NOTE — Progress Notes (Signed)
Patient examined and lab reviewed with [place] PA-C. 

## 2012-05-03 NOTE — Progress Notes (Signed)
Subjective: 3 Days Post-Op Procedure(s) (LRB): POSTERIOR LUMBAR FUSION 2 LEVEL (N/A) Patient reports pain as moderate. C/o right leg pain when she stands to walk and put weight on the leg.  Back pain tolerable at rest worse with motion as expected.  No numbness or tingling in legs. Abdominal pain is low and intermittent.  Having large amounts of flatus but no BM.  Still with foley. Discussed short term SNF for rehab as she is unsure of home help.  Will get social work consult today.  Wearing brace when out of bed. Fever last pm and blood cultures and urine culture obtained.  Awaiting results. Denies shortness of breath or chest pain and using IS Pressure sore on chin less sore today   Objective: Vital signs in last 24 hours: Temp:  [98.6 F (37 C)-102.7 F (39.3 C)] 98.6 F (37 C) (01/23 0631) Pulse Rate:  [99-107] 99  (01/23 0631) Resp:  [17-18] 18  (01/23 0631) BP: (103-126)/(49-65) 126/55 mmHg (01/23 0631) SpO2:  [94 %-96 %] 96 % (01/23 0631)  Intake/Output from previous day: 01/22 0701 - 01/23 0700 In: 1593.7 [P.O.:200; I.V.:1191.7; IV Piggyback:202] Out: 2100 [Urine:2100] Intake/Output this shift:     Basename 05/02/12 0900 05/01/12 0600  HGB 8.4* 9.8*    Basename 05/02/12 0900 05/01/12 0600  WBC 16.0* 13.4*  RBC 2.83* 3.35*  HCT 25.1* 29.6*  PLT 157 148*    Basename 05/01/12 0600  NA 138  K 4.2  CL 106  CO2 22  BUN 13  CREATININE 0.59  GLUCOSE 143*  CALCIUM 8.2*   No results found for this basename: LABPT:2,INR:2 in the last 72 hours  Neurovascular intact Sensation intact distally Intact pulses distally Dorsiflexion/Plantar flexion intact Incision: no drainage No focal weakness in the lower extremities. Abdomin soft and nontender to palpation with positive bowel sounds.  Assessment/Plan: 3 Days Post-Op Procedure(s) (LRB): POSTERIOR LUMBAR FUSION 2 LEVEL (N/A) Up with therapy Continue foley due to acute urinary retention will try voiding trial  tomorrow. Social work consult for SNF for rehab mupiricin ointment to chin wound Dressing change of back today Recheck Hgb today Dulcolax supp  Today  Will try neurontin for leg pain  [name] M 05/03/2012, 8:55 AM

## 2012-05-03 NOTE — Clinical Social Work Placement (Addendum)
Clinical Social Work Department CLINICAL SOCIAL WORK PLACEMENT NOTE 05/03/2012  Patient:  [name], [name]  Account Number:  [id number] Admit date:  04/30/2012  Clinical Social Worker:  Chrys Racer, LCSW  Date/time:  05/03/2012 12:00 N  Clinical Social Work is seeking post-discharge placement for this patient at the following level of care:   [place]   (*CSW will update this form in Epic as items are completed)   05/02/2012  Patient/family provided with [place] Department of Clinical Social Work's list of facilities offering this level of care within the geographic area requested by the patient (or if unable, by the patient's family).  05/02/2012  Patient/family informed of their freedom to choose among providers that offer the needed level of care, that participate in Medicare, Medicaid or managed care program needed by the patient, have an available bed and are willing to accept the patient.  05/02/2012  Patient/family informed of [facility]' ownership interest in [facility], as well as of the fact that they are under no obligation to receive care at this facility.  PASARR submitted to EDS on 05/03/2012 PASARR number received from EDS on 05/03/2012  FL2 transmitted to all facilities in geographic area requested by pt/family on  05/02/2012 FL2 transmitted to all facilities within larger geographic area on 05/03/2012  Patient informed that his/her managed care company has contracts with or will negotiate with  certain facilities, including the following:     Patient/family informed of bed offers received:  05/03/2012 Patient chooses bed at [facility] Physician recommends and patient chooses bed at  SNF  Patient to be transferred to  on  05/05/12 (JB) Patient to be transferred to facility by Miachel Roux)  The following physician request were entered in Epic:   Additional Comments:   Chrys Racer, [place]

## 2012-05-03 NOTE — Progress Notes (Signed)
Patients temperature was 102.7 at 0218. Gave patient 650 mg of tylenol. Temperature retaken at 0300, 101.7. Notified MD of situation. Blood culture x 2 and urine culture to be obtained. Will continue to monitor.

## 2012-05-03 NOTE — Clinical Social Work Psychosocial (Signed)
Clinical Social Work Department BRIEF PSYCHOSOCIAL ASSESSMENT 05/03/2012  Patient:  RAIZEL, PERNA     Account Number:  [id number]     Admit date:  04/30/2012  Clinical Social Worker:  Layla Maw  Date/Time:  05/03/2012 12:00 N  Referred by:  RN  Date Referred:  05/02/2012 Referred for  SNF Placement   Other Referral:   Interview type:  Patient Other interview type:   Spouse at bedside    PSYCHOSOCIAL DATA Living Status:  HUSBAND Admitted from facility:   Level of care:   Primary support name:  Stjulien,Everett [phone] Primary support relationship to patient:  SPOUSE Degree of support available:   Strong    CURRENT CONCERNS Current Concerns  Post-Acute Placement   Other Concerns:    SOCIAL WORK ASSESSMENT / PLAN CSW was referred to Pt to assist with dc planning. Pt lives with her husband and has an adult daughter. Pt's spouse works and would like Pt to be near his work for rehab. Pt has been evaluated and st-snf is recommended. Pt is agreeable. SNF bed search was started and Pt chose [facility] for dc. SNF ready when Pt is ready. Weekend dc possible if arranged on Friday.   Assessment/plan status:  Psychosocial Support/Ongoing Assessment of Needs Other assessment/ plan:   Information/referral to community resources:   SNF list for [place] and [employer] [facility]    PATIENT'S/FAMILY'S RESPONSE TO PLAN OF CARE: Pt with supportive spouse at bedside. Pt is agreeable though not ready today. Pt did not have any additional questions regarding SNF.   Chrys Racer, [place]

## 2012-05-03 NOTE — Progress Notes (Signed)
OT Cancellation Note  Patient Details Name: [name] MRN: [id number] DOB: 1950-06-27   Cancelled Treatment:     Pt declined OT tx this afternoon stating that she did not feel like it with report of 8/10 back pain and that she is still having trouble with trying to get her bowels to move  Britt Bottom 05/03/2012, 2:52 PM

## 2012-05-03 NOTE — Progress Notes (Signed)
Seen and agreed with above note 05/03/2012 Jacqualyn Posey PTA [id number] pager [phone] office

## 2012-05-03 NOTE — Progress Notes (Signed)
Physical Therapy Treatment Patient Details Name: [name] MRN: [id number] DOB: 05-Dec-1950 Today's Date: 05/03/2012 Time: [id number] PT Time Calculation (min): 25 min  PT Assessment / Plan / Recommendation Comments on Treatment Session  Pt  slowly progressing with increased activity tolerance this session. Able to take a few more steps than last session. However continues to have limited  mobility  due to pain and fatigue.     Follow Up Recommendations  SNF;Supervision/Assistance - 24 hour     Does the patient have the potential to tolerate intense rehabilitation     Barriers to Discharge        Equipment Recommendations       Recommendations for Other Services    Frequency Min 5X/week   Plan Discharge plan remains appropriate;Frequency remains appropriate    Precautions / Restrictions Precautions Precautions: Back Precaution Comments: Pt only able to recall 1/3 precautions. Re educated pt on all precautions.  Required Braces or Orthoses: Spinal Brace Spinal Brace: Lumbar corset;Applied in sitting position   Pertinent Vitals/Pain 9/10 pain. Pt premedicated.    Mobility  Bed Mobility Rolling Left: 3: Mod assist;With rail Left Sidelying to Sit: 3: Mod assist;With rails Supine to Sit: 3: Mod assist Sitting - Scoot to Edge of Bed: 4: Min assist Details for Bed Mobility Assistance: Mod A for log rolling and and positioning to sit EOB. Verbal cues for hand placement to maintain balance in sitting EOB. Transfers Sit to Stand: 3: Mod assist;From elevated surface;With upper extremity assist;From bed Stand to Sit: To chair/3-in-1;With upper extremity assist;4: Min assist Details for Transfer Assistance: Pt able to stand with Mod A and pulling up on walker. Max cueing to attempt to push up. However pt moves hand to walker and pulls up. Cueing for upright posture and safety. Better stand to sit transfer with ability to control descent. Ambulation/Gait Ambulation/Gait  Assistance: 3: Mod assist Ambulation Distance (Feet): 10 Feet Assistive device: Rolling walker Ambulation/Gait Assistance Details: A for safety, upright positioning, and sequence. Max encouragement to continue to take steps. A with  maintaining balance with ambulation. Continues to be limited by pain and fatigue. Gait Pattern: Step-to pattern;Decreased step length - left;Decreased step length - right;Narrow base of support Gait velocity: decreased    Exercises     PT Diagnosis:    PT Problem List:   PT Treatment Interventions:     PT Goals Acute Rehab PT Goals PT Goal: Supine/Side to Sit - Progress: Progressing toward goal PT Goal: Sit to Stand - Progress: Progressing toward goal PT Goal: Stand to Sit - Progress: Progressing toward goal PT Goal: Ambulate - Progress: Progressing toward goal  Visit Information  Last PT Received On: 05/03/12 Assistance Needed: +1    Subjective Data      Cognition  Overall Cognitive Status: Appears within functional limits for tasks assessed/performed Arousal/Alertness: Awake/alert Orientation Level: Appears intact for tasks assessed Behavior During Session: [facility] for tasks performed    Balance     End of Session PT - End of Session Equipment Utilized During Treatment: Gait belt;Back brace Activity Tolerance: Patient tolerated treatment well;Patient limited by fatigue;Patient limited by pain Patient left: in chair;with call bell/phone within reach   GP     Geoffery Spruce 05/03/2012, 11:03 AM

## 2012-05-04 DIAGNOSIS — K9189 Other postprocedural complications and disorders of digestive system: Secondary | ICD-10-CM | POA: Diagnosis not present

## 2012-05-04 DIAGNOSIS — R339 Retention of urine, unspecified: Secondary | ICD-10-CM | POA: Diagnosis not present

## 2012-05-04 DIAGNOSIS — K567 Ileus, unspecified: Secondary | ICD-10-CM | POA: Diagnosis not present

## 2012-05-04 LAB — GLUCOSE, CAPILLARY
Glucose-Capillary: 157 mg/dL — ABNORMAL HIGH (ref 70–99)
Glucose-Capillary: 114 mg/dL — ABNORMAL HIGH (ref 70–99)
Glucose-Capillary: 113 mg/dL — ABNORMAL HIGH (ref 70–99)

## 2012-05-04 LAB — URINE CULTURE: Colony Count: NO GROWTH

## 2012-05-04 LAB — CORTISOL-AM, BLOOD: Cortisol - AM: 18.9 ug/dL (ref 4.3–22.4)

## 2012-05-04 MED ORDER — OXYCODONE-ACETAMINOPHEN 5-325 MG PO TABS: 1.0000 | ORAL_TABLET | ORAL | Status: DC | PRN

## 2012-05-04 MED ORDER — DOCUSATE SODIUM 283 MG RE ENEM
1.0000 | ENEMA | Freq: Every day | RECTAL | Status: DC
  Administered 2012-05-05: 56.6 mg via RECTAL
  Filled 2012-05-04 (×2): qty 1

## 2012-05-04 MED ORDER — MUPIROCIN 2 % EX OINT: TOPICAL_OINTMENT | Freq: Two times a day (BID) | CUTANEOUS | Status: DC

## 2012-05-04 MED ORDER — GABAPENTIN 100 MG PO CAPS: 100.0000 mg | ORAL_CAPSULE | Freq: Every day | ORAL | Status: DC

## 2012-05-04 MED ORDER — OXYCODONE HCL ER 20 MG PO T12A: 20.0000 mg | EXTENDED_RELEASE_TABLET | Freq: Two times a day (BID) | ORAL | Status: DC

## 2012-05-04 MED ORDER — DSS 100 MG PO CAPS: 100.0000 mg | ORAL_CAPSULE | Freq: Two times a day (BID) | ORAL | Status: DC

## 2012-05-04 MED ORDER — SENNOSIDES-DOCUSATE SODIUM 8.6-50 MG PO TABS: 1.0000 | ORAL_TABLET | Freq: Every evening | ORAL | Status: DC | PRN

## 2012-05-04 MED ORDER — METHOCARBAMOL 500 MG PO TABS: 500.0000 mg | ORAL_TABLET | Freq: Four times a day (QID) | ORAL | Status: DC | PRN

## 2012-05-04 MED ORDER — MAGNESIUM CITRATE PO SOLN
1.0000 | Freq: Once | ORAL | Status: DC
  Filled 2012-05-04: qty 296

## 2012-05-04 MED ORDER — BISACODYL 10 MG RE SUPP: 10.0000 mg | Freq: Every day | RECTAL | Status: DC | PRN

## 2012-05-04 NOTE — Progress Notes (Signed)
CARE MANAGEMENT NOTE 05/04/2012  Patient:  [name], [name]   Account Number:  [id number]  Date Initiated:  05/04/2012  Documentation initiated by:  Ricki Miller  Subjective/Objective Assessment:   62 yr old female s/p L3-4, L4-5 fusion     Action/Plan:   Patient is for shortrterm rehab at [facility], social worker is aware.   Anticipated DC Date:  05/07/2012   Anticipated DC Plan:  SKILLED NURSING FACILITY  In-house referral  Clinical Social Worker      DC Planning Services  CM consult      Choice offered to / List presented to:             Status of service:  Completed, signed off Medicare Important Message given?   (If response is "NO", the following Medicare IM given date fields will be blank) Date Medicare IM given:   Date Additional Medicare IM given:    Discharge Disposition:  SKILLED NURSING FACILITY  Per UR Regulation:    If discussed at Long Length of Stay Meetings, dates discussed:    Comments:

## 2012-05-04 NOTE — Discharge Summary (Signed)
Physician Discharge Summary  Patient ID: RAFAELITA GREEVER MRN: [id number] DOB/AGE: 17-Oct-1950 62 y.o.  Admit date: 04/30/2012 Discharge date: 05/04/2012  Admission Diagnoses:  Spondylolisthesis of lumbar region degenerative L3-4 and L4-5, bilateral stenosis at L4-5 and left L5-S1 foraminal stenosis  Discharge Diagnoses:  Principal Problem:  *Spondylolisthesis of lumbar region Active Problems:  Urinary retention  Lumbar stenosis with neurogenic claudication  Acute blood loss anemia  Ileus, postoperative   Past Medical History  Diagnosis Date  . GERD (gastroesophageal reflux disease)   . Hypothyroidism   . Peripheral neuropathy   . Cardiomyopathy   . Chest pain 06/2005    Hospitalized, dystolic dysfunction  . Sciatica   . Type II diabetes mellitus   . PONV (postoperative nausea and vomiting)   . H/O hiatal hernia   . Migraines     "it's been a long time since I've had one" (03/20/2012)  . Osteoarthritis   . Arthritis     "all over; mostly in my back" (03/20/2012)  . Chronic lower back pain     "have 2 herniated discs; going to have to have a fusion" (03/20/2012)  . Kidney stones     "never had OR" (03/20/2012)  . Breast cancer     "right" (03/20/2012)  . Family history of anesthesia complication     "father has nausea and vomiting"  . Anginal pain 03/19/2012    saw Dr. Cathie Olden  . Irritable bowel syndrome   . Hypertension     "patient states never had HTN, takes Hyzaar for heart    Surgeries: Procedure(s): POSTERIOR LUMBAR FUSION 2 LEVEL on 04/30/2012.  Central laminectomy L3-4 and L4-5 with right L3-4 TLIF and right L4-5 TLIF.  Segmental instrumentation L3-L5 with pedicle screws and rods.   Consultants (if any):  none  Discharged Condition: Improved  Hospital Course: JONINA TREGLIA is an 62 y.o. female who was admitted 04/30/2012 with a diagnosis of Spondylolisthesis of lumbar region and went to the operating room on 04/30/2012 and underwent the above named  procedures.    She was given perioperative antibiotics:      Anti-infectives     Start     Dose/Rate Route Frequency Ordered Stop   04/30/12 2030   vancomycin (VANCOCIN) IVPB 1000 mg/200 mL premix  Status:  Discontinued        1,000 mg 200 mL/hr over 60 Minutes Intravenous Every 12 hours 04/30/12 1722 05/03/12 0757   04/30/12 0600   vancomycin (VANCOCIN) IVPB 1000 mg/200 mL premix        1,000 mg 200 mL/hr over 60 Minutes Intravenous On call to O.R. 04/29/12 1431 04/30/12 0800        .Pt developed post op abdominal distention and acute on chronic constipation.  KUB with large amount of air in the large and small bowel.  Eventually clearing with multiple agents including laxatives and suppository.  She was taking a modified carb diet at discharge.  Foley catheter was removed POD 1 and required replacement due to urinary retention.  Urecholine was used.  Voiding trial initiated 05/04/12.  Dressing changed daily with no drainage or signs of cellulitis.  H and H monitored and iron started for post op anemia.  Pt asymptomatic and did not require transfusion. Pt slow with PT and felt to need short term NHP for further rehab.  She agreed on [facility]  Arrangements made for transfer on 05/05/12. Pt was fitted with aspen LSO and learned to don and doff brace which will continue  to be worn any time she is out of bed.  No bending lifting or twisting.   She was given sequential compression devices, early ambulation, and aspirin for DVT prophylaxis.  She benefited maximally from the hospital stay.   Recent vital signs:  Filed Vitals:   05/04/12 0534  BP: 118/64  Pulse: 91  Temp: 98.5 F (36.9 C)  Resp: 18    Recent laboratory studies:  Lab Results  Component Value Date   HGB 8.1* 05/03/2012   HGB 8.4* 05/02/2012   HGB 9.8* 05/01/2012   Lab Results  Component Value Date   WBC 13.5* 05/03/2012   PLT 190 05/03/2012   Lab Results  Component Value Date   INR 0.93 04/16/2012   Lab Results   Component Value Date   NA 130* 05/03/2012   K 4.1 05/03/2012   CL 99 05/03/2012   CO2 22 05/03/2012   BUN 7 05/03/2012   CREATININE 0.47* 05/03/2012   GLUCOSE 122* 05/03/2012    Discharge Medications:     Medication List     As of 05/04/2012  3:15 PM    TAKE these medications         aspirin 81 MG tablet   Take 81 mg by mouth daily.      bisacodyl 10 MG suppository   Commonly known as: DULCOLAX   Place 1 suppository (10 mg total) rectally daily as needed.      DSS 100 MG Caps   Take 100 mg by mouth 2 (two) times daily.      gabapentin 100 MG capsule   Commonly known as: NEURONTIN   Take 1 capsule (100 mg total) by mouth at bedtime.      insulin aspart 100 UNIT/ML injection   Commonly known as: novoLOG   Inject 15 Units into the skin 3 (three) times daily before meals.      insulin glargine 100 UNIT/ML injection   Commonly known as: LANTUS   Inject 80 Units into the skin at bedtime.      levothyroxine 100 MCG tablet   Commonly known as: SYNTHROID, LEVOTHROID   Take 1 tablet (100 mcg total) by mouth daily.      losartan-hydrochlorothiazide 50-12.5 MG per tablet   Commonly known as: HYZAAR   Take 1 tablet by mouth daily.      metFORMIN 850 MG tablet   Commonly known as: GLUCOPHAGE   Take 850 mg by mouth 2 (two) times daily with a meal.      methocarbamol 500 MG tablet   Commonly known as: ROBAXIN   Take 1 tablet (500 mg total) by mouth every 6 (six) hours as needed.      mupirocin ointment 2 %   Commonly known as: BACTROBAN   Apply topically 2 (two) times daily.      nitroGLYCERIN 0.4 MG SL tablet   Commonly known as: NITROSTAT   Place 0.4 mg under the tongue every 5 (five) minutes as needed. Chest pain      OxyCODONE 20 mg T12a   Commonly known as: OXYCONTIN   Take 1 tablet (20 mg total) by mouth every 12 (twelve) hours.      oxyCODONE-acetaminophen 5-325 MG per tablet   Commonly known as: PERCOCET/ROXICET   Take 1 tablet by mouth every 4 (four) hours as  needed. For pain      oxyCODONE-acetaminophen 5-325 MG per tablet   Commonly known as: PERCOCET/ROXICET   Take 1-2 tablets by mouth every 4 (four) hours  as needed.      senna-docusate 8.6-50 MG per tablet   Commonly known as: Senokot-S   Take 1 tablet by mouth at bedtime as needed for constipation.      Vitamin D 2000 UNITS Caps   Take 4,000 Units by mouth daily.         Diagnostic Studies: Dg Chest 1 View  05/02/2012  *RADIOLOGY REPORT*  Clinical Data: Fever and postop from spine surgery.  CHEST - 1 VIEW  Comparison: Chest radiograph 03/19/2012  Findings: Single view of the chest was obtained.  The patient has prominent azygos shadow.  Prominent central vascular structures without frank pulmonary edema. Mild fullness in the left hilar region.  Surgical plate in the lower cervical spine and stable elevation of the right hemidiaphragm. Patient is mildly rotated on the examination.  IMPRESSION: Prominent central vascular structures suggest vascular congestion.   Original Report Authenticated By: Markus Daft, M.D.    Dg Lumbar Spine 2-3 Views  04/30/2012  *RADIOLOGY REPORT*  Clinical Data: Lumbar fusion  LUMBAR SPINE - 2-3 VIEW,DG C-ARM GT 120 MIN  Comparison: MR 02/17/2012  Findings: The four spot intraoperative fluoroscopic spot images document changes of PLIF L4-S1, hardware projecting in expected location.  Graft markers project in the interspaces.  IMPRESSION:  1.  PLIF L4 S1.   Original Report Authenticated By: D. Wallace Going, MD    Dg Abd 1 View  05/02/2012  *RADIOLOGY REPORT*  Clinical Data: Lower abdominal pain and recent lumbar spine fusion.  ABDOMEN - 1 VIEW  Comparison: Intraoperative images 04/30/2012  Findings: Single view of the abdomen demonstrates gas throughout the colon.  There is also gas within the small bowel.  The patient has pedicle screw fixation at L4-S1.  IMPRESSION: Gas-filled small and large bowel loops.  Findings could be associated with a postoperative ileus.    Original Report Authenticated By: Markus Daft, M.D.    Dg C-arm Gt 120 Min  04/30/2012  *RADIOLOGY REPORT*  Clinical Data: Lumbar fusion  LUMBAR SPINE - 2-3 VIEW,DG C-ARM GT 120 MIN  Comparison: MR 02/17/2012  Findings: The four spot intraoperative fluoroscopic spot images document changes of PLIF L4-S1, hardware projecting in expected location.  Graft markers project in the interspaces.  IMPRESSION:  1.  PLIF L4 S1.   Original Report Authenticated By: D. Wallace Going, MD     Disposition: [place] PLAN:  Physical therapy daily for ambulation and gait training.  Brace when out of bed.  No lifting >5 pounds.  No bending or twisting.  OT for ADLs as pt plans to return to her home independent.   Call if there is increasing drainage, fever greater than 101.5, severe head aches, and worsening nausea or light sensitivity. If shortness of breath, bloody cough or chest tightness or pain go to an emergency room. No lifting greater than 10 lbs.  After 5 days may shower and change dressing following bathing with shower.When bathing remove the brace shower and replace brace before getting out of the shower. If drainage, keep dry dressing and do not bathe the incision, use an moisture impervious dressing. Please call and return for scheduled follow up appointment 2 weeks from the time of surgery.     Follow-up Information    Follow up with NITKA,JAMES E, MD. In 2 weeks.   Contact information:   [place] [phone]           Signed: Epimenio Foot 05/04/2012, 3:15 PM  ready for transfer to SNF , ambulatory with PT.

## 2012-05-04 NOTE — Progress Notes (Signed)
Utilization review completed. Kareema Keitt, RN, BSN. 

## 2012-05-04 NOTE — Progress Notes (Signed)
Subjective: 4 Days Post-Op Procedure(s) (LRB): POSTERIOR LUMBAR FUSION 2 LEVEL (N/A) Patient reports pain as moderate.  Leg pain improved still noticeable with weight of right leg. Back pain improved Still complain of abdominal pain and distention.  Worried about diverticulitis.  Still having large amt of flatus but no bm Elevated fevers last night.  WBC trending down and hgb stable. Pt has trouble with constipation at home and abd xray with diffuse and large amt of air in bowel which is most likely source of pain.  However will check WBC and chemistries today  Objective: Vital signs in last 24 hours: Temp:  [98.5 F (36.9 C)-101 F (38.3 C)] 98.5 F (36.9 C) (01/24 0534) Pulse Rate:  [91-110] 91  (01/24 0534) Resp:  [16-18] 18  (01/24 0534) BP: (104-132)/(57-64) 118/64 mmHg (01/24 0534) SpO2:  [97 %-99 %] 97 % (01/24 0534)  Intake/Output from previous day: 01/23 0701 - 01/24 0700 In: 781.3 [I.V.:781.3] Out: 3200 [Urine:3200] Intake/Output this shift:     Basename 05/03/12 0800 05/02/12 0900  HGB 8.1* 8.4*    Basename 05/03/12 0800 05/02/12 0900  WBC 13.5* 16.0*  RBC 2.76* 2.83*  HCT 24.1* 25.1*  PLT 190 157    Basename 05/03/12 0800  NA 130*  K 4.1  CL 99  CO2 22  BUN 7  CREATININE 0.47*  GLUCOSE 122*  CALCIUM 9.0   No results found for this basename: LABPT:2,INR:2 in the last 72 hours  ABD soft Neurovascular intact Sensation intact distally Intact pulses distally Dorsiflexion/Plantar flexion intact Incision: no drainage  Assessment/Plan: 4 Days Post-Op Procedure(s) (LRB): POSTERIOR LUMBAR FUSION 2 LEVEL (N/A) Up with therapy Discharge to SNF when more medically stable. Possibly tomorrow if bed available and bowel function improves  Source of fever most likely bowels.  Urine and blood cultures no growth.  WBC trending down.  Will try mag citrate today and if no results use enema.  Gianni Fuchs M 05/04/2012, 9:48 AM

## 2012-05-04 NOTE — Progress Notes (Signed)
Physical Therapy Treatment Patient Details Name: [name] MRN: [id number] DOB: 06/17/1950 Today's Date: 05/04/2012 Time: [id number] PT Time Calculation (min): 41 min  PT Assessment / Plan / Recommendation Comments on Treatment Session  Pt presents with better mood this session. Increased activity tolerance with long hall ambulation and BSC transfer. No c/o of pain this treatment.     Follow Up Recommendations  Supervision/Assistance - 24 hour;SNF     Does the patient have the potential to tolerate intense rehabilitation     Barriers to Discharge        Equipment Recommendations  None recommended by PT    Recommendations for Other Services    Frequency Min 5X/week   Plan Discharge plan remains appropriate;Frequency remains appropriate    Precautions / Restrictions Precautions Precautions: Back Precaution Comments: Reviwed precautions and provided handout. Required Braces or Orthoses: Spinal Brace Spinal Brace: Applied in sitting position Restrictions Weight Bearing Restrictions: No       Mobility  Bed Mobility Rolling Left: 4: Min assist Left Sidelying to Sit: 4: Min assist Supine to Sit: 4: Min assist Sitting - Scoot to Edge of Bed: 5: Supervision Details for Bed Mobility Assistance: MinA for log rolling and lowering LEs to floor to maintain precautions. Transfers Sit to Stand: 4: Min assist;Without upper extremity assist;From bed;From chair/3-in-1 Stand to Sit: 4: Min assist;With upper extremity assist;To chair/3-in-1 Details for Transfer Assistance: Pt now able to push up from surface with both UEs and transition hands to walker. VCs for upright posture and maintaining precautions.  Ambulation/Gait Ambulation/Gait Assistance: 4: Min assist Ambulation/Gait Assistance Details: Min A for posture and gait sequence. +2 for chair follow. Increased base of support and increased step length with verbal cueing.  Gait Pattern: Step-to pattern;Decreased step length -  right;Decreased step length - left      PT Goals Acute Rehab PT Goals PT Goal: Supine/Side to Sit - Progress: Progressing toward goal PT Goal: Sit to Stand - Progress: Progressing toward goal PT Goal: Stand to Sit - Progress: Progressing toward goal PT Goal: Ambulate - Progress: Progressing toward goal  Visit Information  Last PT Received On: 05/04/12 Assistance Needed: +1 PT/OT Co-Evaluation/Treatment: Yes    Subjective Data      Cognition  Overall Cognitive Status: Appears within functional limits for tasks assessed/performed Arousal/Alertness: Awake/alert Orientation Level: Appears intact for tasks assessed Behavior During Session: [facility] for tasks performed        End of Session PT - End of Session Activity Tolerance: Patient tolerated treatment well Patient left: in chair;with call bell/phone within reach   GP     Geoffery Spruce 05/04/2012, 11:43 AM

## 2012-05-04 NOTE — Progress Notes (Signed)
Seen and agreed 05/04/2012 Jacqualyn Posey PTA [phone] pager [phone] office

## 2012-05-04 NOTE — Progress Notes (Signed)
Occupational Therapy Treatment Patient Details Name: [name] MRN: [id number] DOB: Dec 05, 1950 Today's Date: 05/04/2012 Time: [id number] OT Time Calculation (min): 35 min  OT Assessment / Plan / Recommendation Comments on Treatment Session Pt doing well and making progress. Pt should conitune with OT services to maximize level of function and safety    Follow Up Recommendations  SNF;Other (comment) (pt would benefit more from SNF at this time, pt agrees)    Barriers to Discharge       Equipment Recommendations  3 in 1 bedside comode;Tub/shower seat    Recommendations for Other Services Other (comment) ([facility] not adequate for pt needs at this time,SNF appropriate)  Frequency Min 2X/week   Plan Discharge plan remains appropriate    Precautions / Restrictions Precautions Precautions: Back Precaution Comments: pt able to recall 3/3 back precautions Required Braces or Orthoses: Spinal Brace Spinal Brace: Applied in sitting position Restrictions Weight Bearing Restrictions: No   Pertinent Vitals/Pain     ADL  Lower Body Bathing: Simulated;Moderate assistance Where Assessed - Lower Body Bathing: Unsupported sitting;Supported sit to stand Lower Body Dressing: Moderate assistance;Performed Where Assessed - Lower Body Dressing: Unsupported sitting Toilet Transfer: Performed;Minimal assistance Toilet Transfer Method: Sit to stand Toilet Transfer Equipment: Bedside commode Toileting - Clothing Manipulation and Hygiene: Performed;Minimal assistance;+1 Total assistance;Other (comment) (total A for hygiene) Where Assessed - Toileting Clothing Manipulation and Hygiene: Standing Equipment Used: Back brace;Rolling walker;Sock aid;Gait belt;Reacher    OT Diagnosis:    OT Problem List:   OT Treatment Interventions:     OT Goals ADL Goals ADL Goal: Lower Body Dressing - Progress: Progressing toward goals ADL Goal: Toilet Transfer - Progress: Progressing toward goals ADL Goal:  Toileting - Clothing Manipulation - Progress: Progressing toward goals ADL Goal: Toileting - Hygiene - Progress: Not progressing ADL Goal: Tub/Shower Transfer - Progress: Progressing toward goals  Visit Information  Last OT Received On: 05/04/12 Assistance Needed: +1    Subjective Data  Subjective: " I hope to go to rehab at [facility] tomorrow " Patient Stated Goal: To d/c to SNF for rehab, then home   Prior Functioning       Cognition  Overall Cognitive Status: Appears within functional limits for tasks assessed/performed Arousal/Alertness: Awake/alert Orientation Level: Appears intact for tasks assessed Behavior During Session: [facility] for tasks performed    Mobility  Shoulder Instructions Bed Mobility Bed Mobility: Rolling Left;Left Sidelying to Sit;Supine to Sit;Sitting - Scoot to [employer] of Bed;Sit to Supine Rolling Left: 4: Min assist Left Sidelying to Sit: 4: Min assist Supine to Sit: 4: Min assist Sitting - Scoot to Edge of Bed: 5: Supervision Details for Bed Mobility Assistance: MinA for log rolling and lowering LEs to floor to maintain precautions. Transfers Transfers: Sit to Stand;Stand to Sit Sit to Stand: 4: Min assist;From bed;From chair/3-in-1 Stand to Sit: 4: Min assist;To chair/3-in-1 Details for Transfer Assistance: cues for upright posture after sit - stand       Exercises      Balance Balance Balance Assessed: No   End of Session OT - End of Session Equipment Utilized During Treatment: Gait belt;Back brace;Other (comment) (3 in 1, reacher, sock aid, LH sponge) Activity Tolerance: Patient tolerated treatment well Patient left: in chair;with call bell/phone within reach  GO     Britt Bottom 05/04/2012, 12:02 PM

## 2012-05-05 LAB — GLUCOSE, CAPILLARY
Glucose-Capillary: 121 mg/dL — ABNORMAL HIGH (ref 70–99)
Glucose-Capillary: 98 mg/dL (ref 70–99)

## 2012-05-05 NOTE — Clinical Social Work Note (Signed)
Pt to transfer to [facility] today via [place]. Pt, pt's spouse, and SNF aware of d/c. D/C packet complete with chart copy and signed fl2. CSW signing off as no other CSW needs identified at this time.  Wandra Feinstein, MSW, [place] (Weekends 8:00am-4:30pm)

## 2012-05-07 NOTE — Progress Notes (Signed)
Note examined and lab reviewed with Lonzo Candy.

## 2012-05-09 LAB — CULTURE, BLOOD (ROUTINE X 2): Culture: NO GROWTH

## 2012-06-18 IMAGING — CT CT ABD-PELV W/ CM
2 of 5 series · 17 of 46 positions shown, 19 images · IV contrast (agent unspecified)
Comparison: None.

CLINICAL DATA: Right lower quadrant pain, nausea and diarrhea.

CT ABDOMEN AND PELVIS WITH CONTRAST
TECHNIQUE: Multidetector CT imaging of the abdomen and pelvis was
performed following the standard protocol during bolus
administration of intravenous contrast.
Contrast: 115 ml [id number].

[Series 2: abd/ pel 5mm · axial · 0.77mm/px · z∈[-554,-154]mm · 14 of 90 slices shown, 16 images]
[im 5/90  soft-tissue]
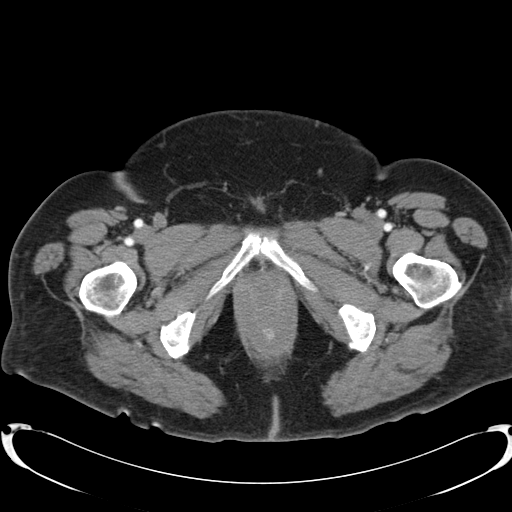
[im 5/90  bone]
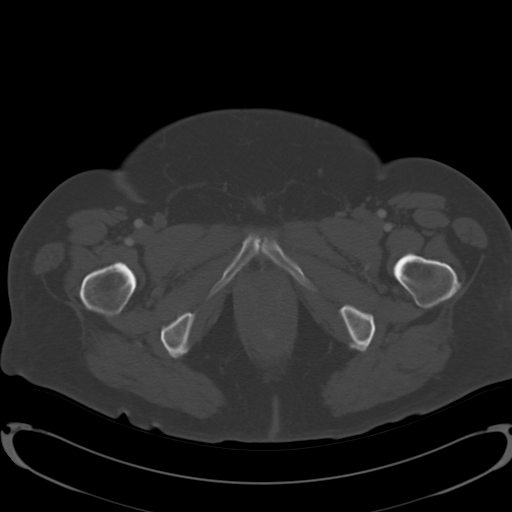
[im 10/90  soft-tissue]
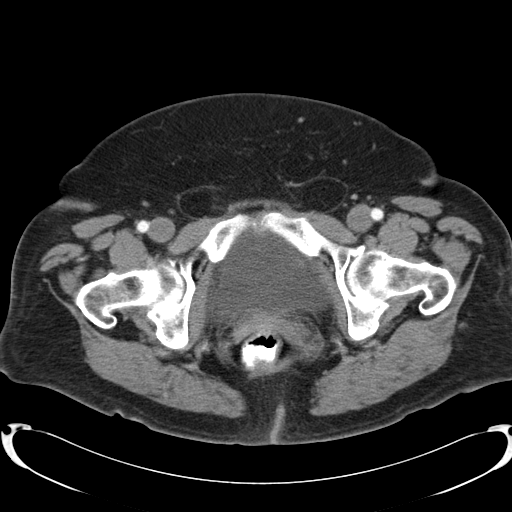
[im 19/90  soft-tissue]
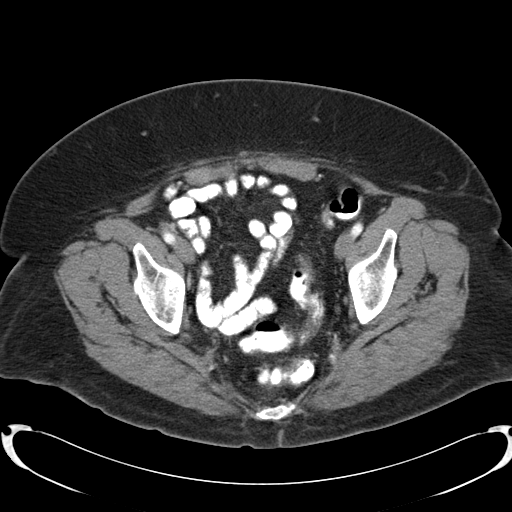
[im 24/90  soft-tissue]
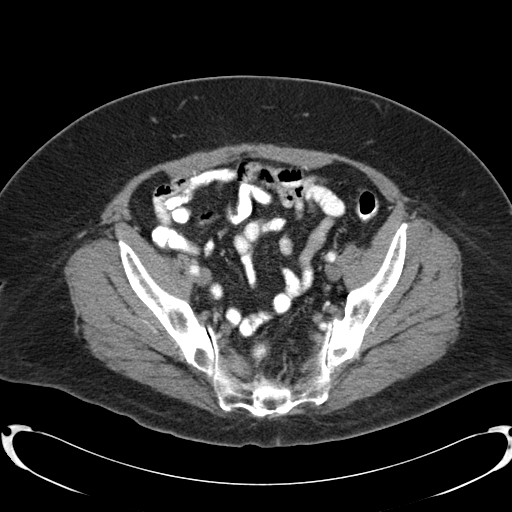
[im 29/90  soft-tissue]
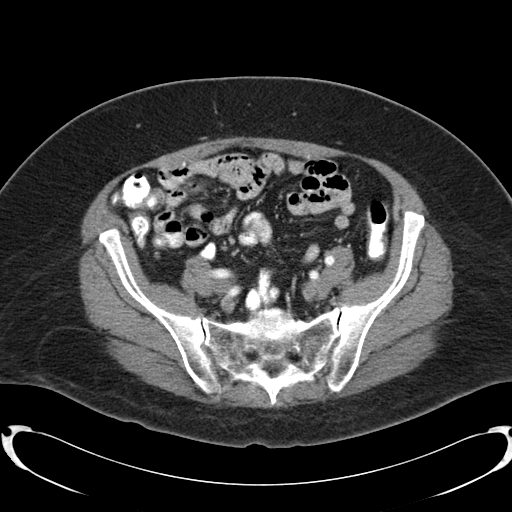
[im 38/90  soft-tissue]
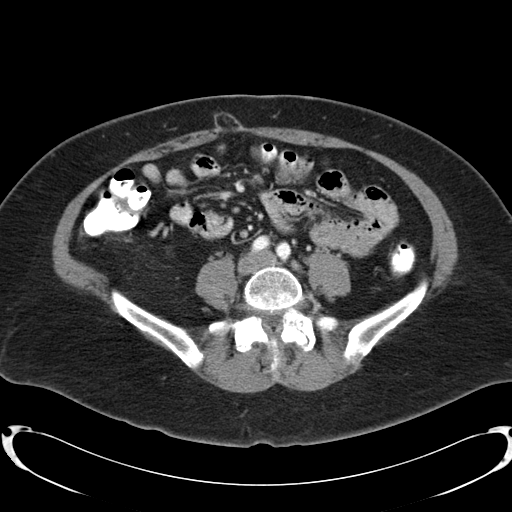
[im 43/90  soft-tissue]
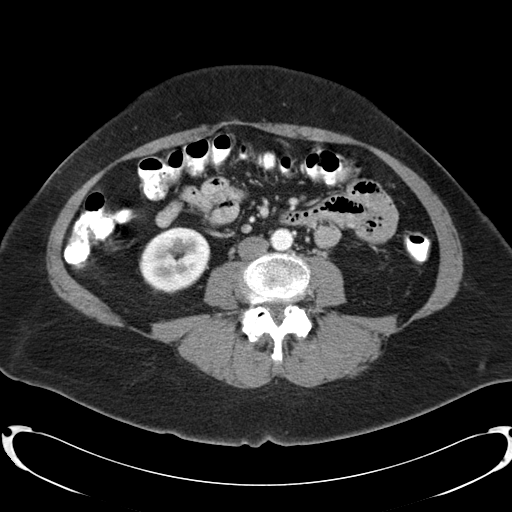
[im 47/90  soft-tissue]
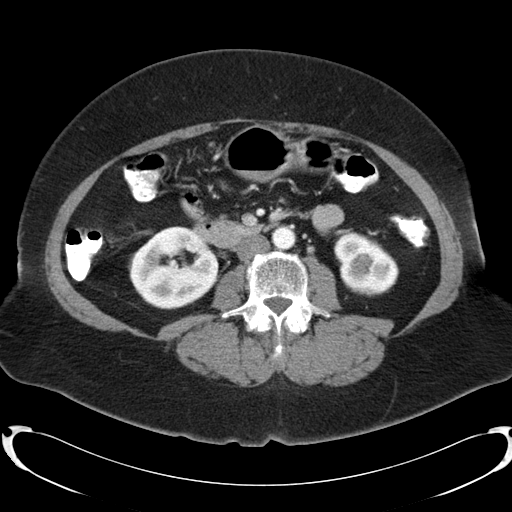
[im 52/90  soft-tissue]
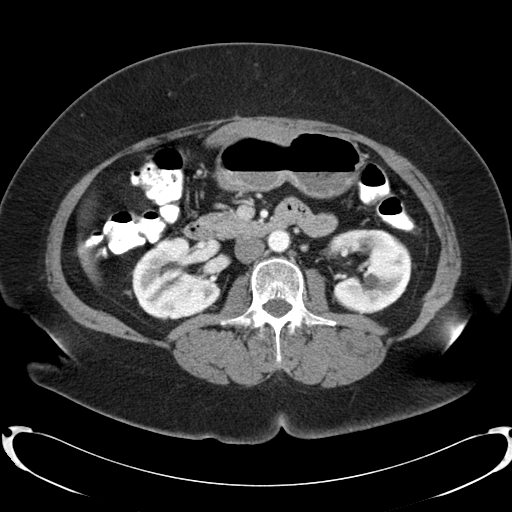
[im 52/90  bone]
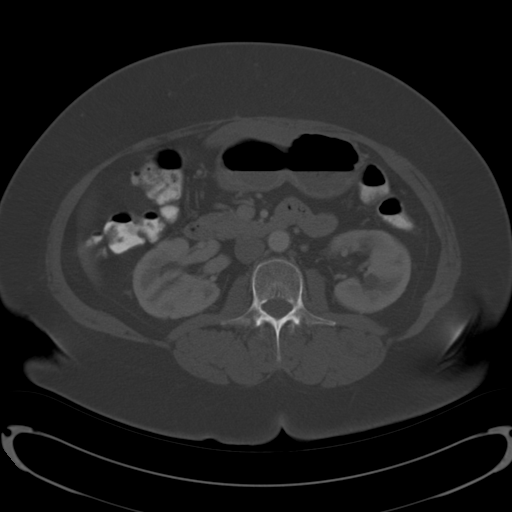
[im 61/90  soft-tissue]
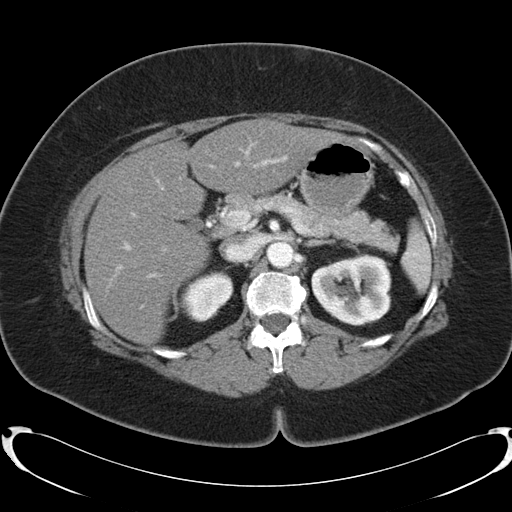
[im 66/90  soft-tissue]
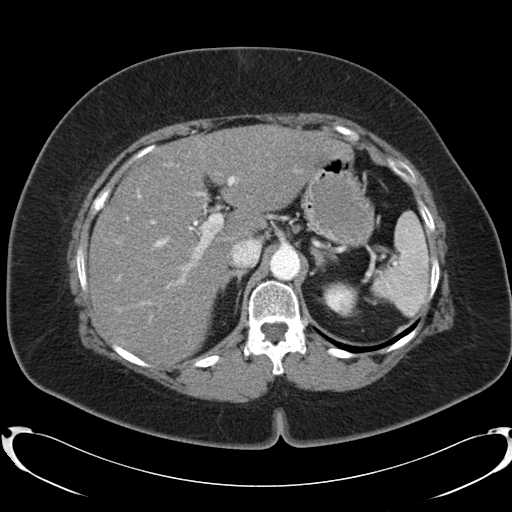
[im 71/90  soft-tissue]
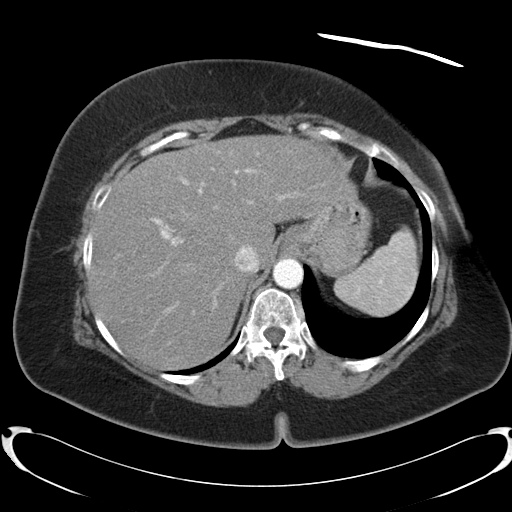
[im 80/90  soft-tissue]
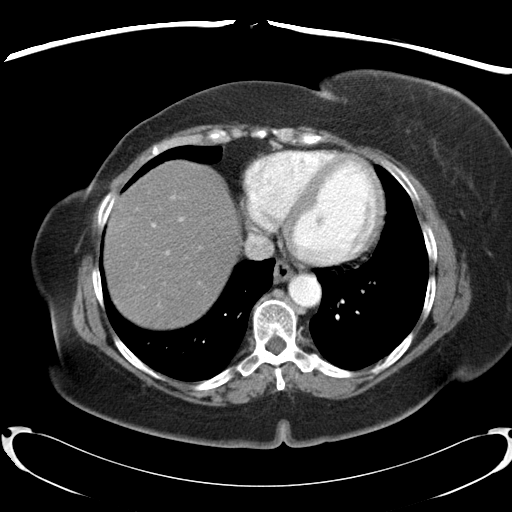
[im 85/90  soft-tissue]
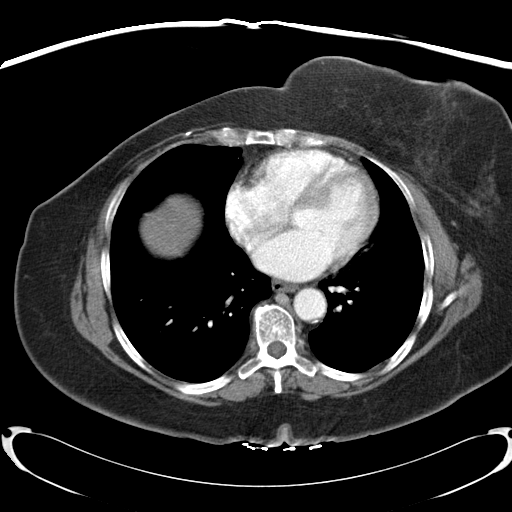

[Series 602: <mpr range> · coronal · 0.90mm/px · 3 of 116 slices shown]
[im 39/116  soft-tissue]
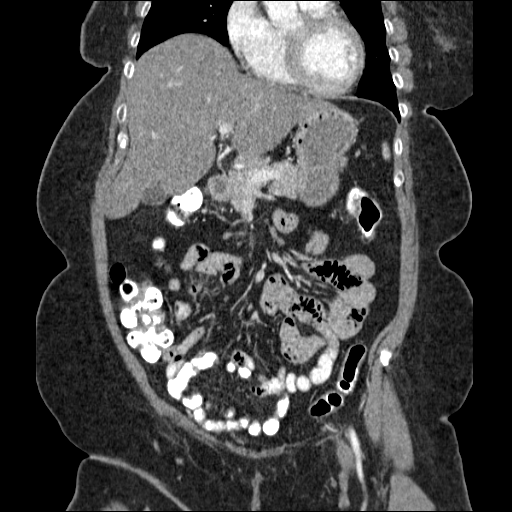
[im 52/116  soft-tissue]
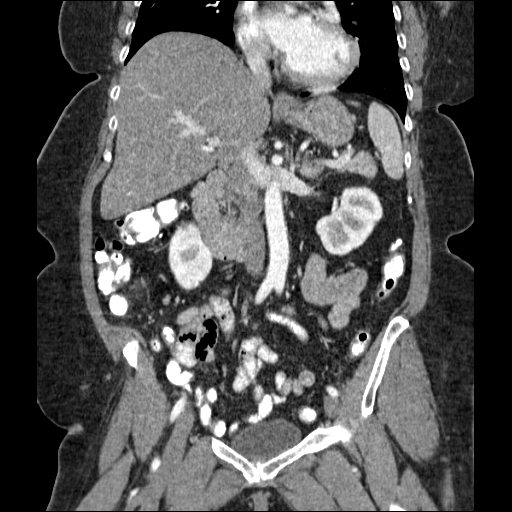
[im 64/116  soft-tissue]
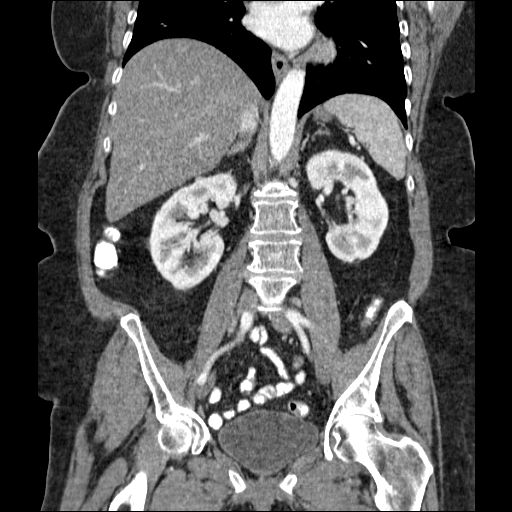

[17 of 46 positions shown; findings below may reference images not displayed]

FINDINGS: The patient is status post right mastectomy.  Lung bases
are clear.  No pleural or pericardial effusion.

There is diffuse low attenuation of the liver consistent with fatty
change.  The gallbladder, adrenal glands, spleen, pancreas and
right kidney appear normal.  Two nonobstructing stones are seen in
the left kidney which are unchanged.  Both stones measure 0.6 cm.
The appendix is well seen and normal in appearance.  The stomach
and small and large bowel are normal in appearance.  Very small fat
containing umbilical hernia is noted.  The patient is status post
hysterectomy.  There is no focal bony abnormality with lower lumbar
spondylosis identified.
IMPRESSION: 1.  No acute finding.  Negative for appendicitis.
2.  Fatty infiltration of the liver.
3.  Two nonobstructing left renal stones are unchanged.
4.  Small fat containing umbilical hernia.
5.  Status post right mastectomy and hysterectomy.

## 2012-08-14 ENCOUNTER — Encounter: Payer: Self-pay | Admitting: Family Medicine

## 2012-08-14 ENCOUNTER — Ambulatory Visit (INDEPENDENT_AMBULATORY_CARE_PROVIDER_SITE_OTHER): Payer: Medicare Other | Admitting: Family Medicine

## 2012-08-14 VITALS — BP 130/70 | HR 84 | Temp 98.4°F | Resp 20 | Wt 194.0 lb

## 2012-08-14 DIAGNOSIS — J019 Acute sinusitis, unspecified: Secondary | ICD-10-CM

## 2012-08-14 MED ORDER — FLUTICASONE PROPIONATE 50 MCG/ACT NA SUSP: 2.0000 | Freq: Every day | NASAL | Status: DC

## 2012-08-14 MED ORDER — LEVOFLOXACIN 500 MG PO TABS: 500.0000 mg | ORAL_TABLET | Freq: Every day | ORAL | Status: DC

## 2012-08-14 NOTE — Progress Notes (Signed)
Subjective:    Patient ID: [name], female    DOB: 1950/07/21, 62 y.o.   MRN: [id number]  HPI  Patient has had bilateral maxillary and frontal sinus pressure for one week. Began with a cold. However now she's having headache, disequilibrium, pressure in her cheeks and her for head, and subjective fevers. She feels sick. She's having postnasal drip and a cough. She denies any nausea vomiting or diarrhea. She has tremendous pressure behind her eyes. She is also sneezing. She's never had a history of allergies prior to today. Past Medical History  Diagnosis Date  . GERD (gastroesophageal reflux disease)   . Hypothyroidism   . Peripheral neuropathy   . Cardiomyopathy   . Chest pain 06/2005    Hospitalized, dystolic dysfunction  . Sciatica   . Type II diabetes mellitus   . PONV (postoperative nausea and vomiting)   . H/O hiatal hernia   . Migraines     "it's been a long time since I've had one" (03/20/2012)  . Osteoarthritis   . Arthritis     "all over; mostly in my back" (03/20/2012)  . Chronic lower back pain     "have 2 herniated discs; going to have to have a fusion" (03/20/2012)  . Kidney stones     "never had OR" (03/20/2012)  . Breast cancer     "right" (03/20/2012)  . Family history of anesthesia complication     "father has nausea and vomiting"  . Anginal pain 03/19/2012    saw Dr. Cathie Olden  . Irritable bowel syndrome   . Hypertension     "patient states never had HTN, takes Hyzaar for heart   Current Outpatient Prescriptions on File Prior to Visit  Medication Sig Dispense Refill  . aspirin 81 MG tablet Take 81 mg by mouth daily.      . Cholecalciferol (VITAMIN D) 2000 UNITS CAPS Take 4,000 Units by mouth daily.      [name] Kitchen docusate sodium 100 MG CAPS Take 100 mg by mouth 2 (two) times daily.  10 capsule    . insulin aspart (NOVOLOG) 100 UNIT/ML injection Inject 15 Units into the skin 3 (three) times daily before meals.      . insulin glargine (LANTUS) 100 UNIT/ML  injection Inject 80 Units into the skin at bedtime.       [name] Kitchen levothyroxine (SYNTHROID, LEVOTHROID) 100 MCG tablet Take 1 tablet (100 mcg total) by mouth daily.  30 tablet  6  . losartan-hydrochlorothiazide (HYZAAR) 50-12.5 MG per tablet Take 1 tablet by mouth daily.       . metFORMIN (GLUCOPHAGE) 850 MG tablet Take 850 mg by mouth 2 (two) times daily with a meal.       . methocarbamol (ROBAXIN) 500 MG tablet Take 1 tablet (500 mg total) by mouth every 6 (six) hours as needed.  30 tablet  0  . nitroGLYCERIN (NITROSTAT) 0.4 MG SL tablet Place 0.4 mg under the tongue every 5 (five) minutes as needed. Chest pain      . OxyCODONE (OXYCONTIN) 20 mg T12A Take 1 tablet (20 mg total) by mouth every 12 (twelve) hours.  60 tablet  0  . oxyCODONE-acetaminophen (PERCOCET/ROXICET) 5-325 MG per tablet Take 1 tablet by mouth every 4 (four) hours as needed. For pain       No current facility-administered medications on file prior to visit.   Allergies  Allergen Reactions  . Dilaudid (Hydromorphone Hcl) Nausea And Vomiting  . Ampicillin Rash    REACTION:  Rash and welts on her hands when she took it in the hospital  . Latex Itching, Dermatitis and Rash    REACTION: Patient had a reaction to tape after surgery and they told her she was allergic to Latex 03/20/2012 "if it's on too long it will break me out"   History   Social History  . Marital Status: Married    Spouse Name: N/A    Number of Children: 1  . Years of Education: N/A   Occupational History  . Retired since 2003    Social History Main Topics  . Smoking status: Never Smoker   . Smokeless tobacco: Never Used  . Alcohol Use: No  . Drug Use: No  . Sexually Active: Not Currently   Other Topics Concern  . Not on file   Social History Narrative  . No narrative on file       Review of Systems  All other systems reviewed and are negative.       Objective:   Physical Exam  Vitals reviewed. HENT:  Head: Normocephalic and  atraumatic.  Right Ear: External ear normal.  Left Ear: External ear normal.  Nose: Mucosal edema and rhinorrhea present. Right sinus exhibits maxillary sinus tenderness and frontal sinus tenderness. Left sinus exhibits maxillary sinus tenderness and frontal sinus tenderness.  Mouth/Throat: Oropharynx is clear and moist.  Cardiovascular: Normal rate, regular rhythm and normal heart sounds.  Exam reveals no gallop.   No murmur heard. Pulmonary/Chest: Effort normal and breath sounds normal. No respiratory distress. She has no wheezes. She has no rales.          Assessment & Plan:  1. Acute rhinosinusitis Patient has an acute sinus infection. She is to take Levaquin 500 mg by mouth daily for 7 days. Also recommended Flonase 2 sprays each nostril inhaled daily. Recommend she continue simply saline nasal spray to facilitate drainage. She's to come in one week if she's no better sooner she is getting worse. - fluticasone (FLONASE) 50 MCG/ACT nasal spray; Place 2 sprays into the nose daily.  Dispense: 16 g; Refill: 6 - levofloxacin (LEVAQUIN) 500 MG tablet; Take 1 tablet (500 mg total) by mouth daily.  Dispense: 7 tablet; Refill: 0

## 2012-08-16 ENCOUNTER — Telehealth: Payer: Self-pay | Admitting: Family Medicine

## 2012-08-16 MED ORDER — BENZONATATE 200 MG PO CAPS: 200.0000 mg | ORAL_CAPSULE | Freq: Three times a day (TID) | ORAL | Status: DC

## 2012-08-16 NOTE — Telephone Encounter (Signed)
Can have tessalon perles 200 mg poq8 hrs prn

## 2012-08-16 NOTE — Telephone Encounter (Signed)
..  Rx filled pt aware

## 2012-08-28 ENCOUNTER — Ambulatory Visit (INDEPENDENT_AMBULATORY_CARE_PROVIDER_SITE_OTHER): Payer: Medicare Other | Admitting: Family Medicine

## 2012-08-28 ENCOUNTER — Encounter: Payer: Self-pay | Admitting: Family Medicine

## 2012-08-28 VITALS — BP 124/80 | HR 64 | Temp 98.3°F | Resp 20 | Ht 64.5 in | Wt 198.0 lb

## 2012-08-28 DIAGNOSIS — J019 Acute sinusitis, unspecified: Secondary | ICD-10-CM

## 2012-08-28 MED ORDER — AMOXICILLIN-POT CLAVULANATE 875-125 MG PO TABS: 1.0000 | ORAL_TABLET | Freq: Two times a day (BID) | ORAL | Status: DC

## 2012-08-28 MED ORDER — PREDNISONE 20 MG PO TABS: ORAL_TABLET | ORAL | Status: DC

## 2012-08-28 NOTE — Progress Notes (Signed)
Subjective:    Patient ID: [name], female    DOB: Jul 27, 1950, 62 y.o.   MRN: [id number]  HPI  08/16/12  Patient has had bilateral maxillary and frontal sinus pressure for one week. Began with a cold. However now she's having headache, disequilibrium, pressure in her cheeks and her for head, and subjective fevers. She feels sick. She's having postnasal drip and a cough. She denies any nausea vomiting or diarrhea. She has tremendous pressure behind her eyes. She is also sneezing. She's never had a history of allergies prior to today.  Therefore, I diagnosed her with: 1. Acute rhinosinusitis Patient has an acute sinus infection. She is to take Levaquin 500 mg by mouth daily for 7 days. Also recommended Flonase 2 sprays each nostril inhaled daily. Recommend she continue simply saline nasal spray to facilitate drainage. She's to come in one week if she's no better sooner she is getting worse. - fluticasone (FLONASE) 50 MCG/ACT nasal spray; Place 2 sprays into the nose daily.  Dispense: 16 g; Refill: 6 - levofloxacin (LEVAQUIN) 500 MG tablet; Take 1 tablet (500 mg total) by mouth daily.  Dispense: 7 tablet; Refill: 0  08/28/12 She is no better. In fact she states she feels worse. She continues to have low-grade fever. She continues to have constant rhinorrhea. She continues to have constant pressure and pain and her bilateral maxillary and frontal sinuses. She continues to have postnasal drip and a cough due to that.  Her sugars have generally been well controlled her diabetes ranging from 130 to 150  Past Medical History  Diagnosis Date  . GERD (gastroesophageal reflux disease)   . Hypothyroidism   . Peripheral neuropathy   . Cardiomyopathy   . Chest pain 06/2005    Hospitalized, dystolic dysfunction  . Sciatica   . Type II diabetes mellitus   . PONV (postoperative nausea and vomiting)   . H/O hiatal hernia   . Migraines     "it's been a long time since I've had one" (03/20/2012)  .  Osteoarthritis   . Arthritis     "all over; mostly in my back" (03/20/2012)  . Chronic lower back pain     "have 2 herniated discs; going to have to have a fusion" (03/20/2012)  . Kidney stones     "never had OR" (03/20/2012)  . Breast cancer     "right" (03/20/2012)  . Family history of anesthesia complication     "father has nausea and vomiting"  . Anginal pain 03/19/2012    saw Dr. Cathie Olden  . Irritable bowel syndrome   . Hypertension     "patient states never had HTN, takes Hyzaar for heart   Current Outpatient Prescriptions on File Prior to Visit  Medication Sig Dispense Refill  . aspirin 81 MG tablet Take 81 mg by mouth daily.      . benzonatate (TESSALON) 200 MG capsule Take 1 capsule (200 mg total) by mouth every 8 (eight) hours.  30 capsule  0  . celecoxib (CELEBREX) 200 MG capsule Take 200 mg by mouth daily.      . Cholecalciferol (VITAMIN D) 2000 UNITS CAPS Take 4,000 Units by mouth daily.      [name] Kitchen docusate sodium 100 MG CAPS Take 100 mg by mouth 2 (two) times daily.  10 capsule    . fluticasone (FLONASE) 50 MCG/ACT nasal spray Place 2 sprays into the nose daily.  16 g  6  . insulin aspart (NOVOLOG) 100 UNIT/ML injection Inject 15  Units into the skin 3 (three) times daily before meals.      . insulin glargine (LANTUS) 100 UNIT/ML injection Inject 80 Units into the skin at bedtime.       [name] Kitchen levofloxacin (LEVAQUIN) 500 MG tablet Take 1 tablet (500 mg total) by mouth daily.  7 tablet  0  . levothyroxine (SYNTHROID, LEVOTHROID) 100 MCG tablet Take 1 tablet (100 mcg total) by mouth daily.  30 tablet  6  . losartan-hydrochlorothiazide (HYZAAR) 50-12.5 MG per tablet Take 1 tablet by mouth daily.       . metFORMIN (GLUCOPHAGE) 850 MG tablet Take 850 mg by mouth 2 (two) times daily with a meal.       . methocarbamol (ROBAXIN) 500 MG tablet Take 1 tablet (500 mg total) by mouth every 6 (six) hours as needed.  30 tablet  0  . nitroGLYCERIN (NITROSTAT) 0.4 MG SL tablet Place 0.4 mg under the  tongue every 5 (five) minutes as needed. Chest pain      . OxyCODONE (OXYCONTIN) 20 mg T12A Take 1 tablet (20 mg total) by mouth every 12 (twelve) hours.  60 tablet  0  . oxyCODONE-acetaminophen (PERCOCET/ROXICET) 5-325 MG per tablet Take 1 tablet by mouth every 4 (four) hours as needed. For pain      . pregabalin (LYRICA) 150 MG capsule Take 150 mg by mouth daily.       No current facility-administered medications on file prior to visit.   Allergies  Allergen Reactions  . Dilaudid (Hydromorphone Hcl) Nausea And Vomiting  . Ampicillin Rash    REACTION: Rash and welts on her hands when she took it in the hospital  . Latex Itching, Dermatitis and Rash    REACTION: Patient had a reaction to tape after surgery and they told her she was allergic to Latex 03/20/2012 "if it's on too long it will break me out"   History   Social History  . Marital Status: Married    Spouse Name: N/A    Number of Children: 1  . Years of Education: N/A   Occupational History  . Retired since 2003    Social History Main Topics  . Smoking status: Never Smoker   . Smokeless tobacco: Never Used  . Alcohol Use: No  . Drug Use: No  . Sexually Active: Not Currently   Other Topics Concern  . Not on file   Social History Narrative  . No narrative on file       Review of Systems  All other systems reviewed and are negative.       Objective:   Physical Exam  Vitals reviewed. HENT:  Head: Normocephalic and atraumatic.  Right Ear: External ear normal.  Left Ear: External ear normal.  Nose: Mucosal edema and rhinorrhea present. Right sinus exhibits maxillary sinus tenderness and frontal sinus tenderness. Left sinus exhibits maxillary sinus tenderness and frontal sinus tenderness.  Mouth/Throat: Oropharynx is clear and moist.  Cardiovascular: Normal rate, regular rhythm and normal heart sounds.  Exam reveals no gallop.   No murmur heard. Pulmonary/Chest: Effort normal and breath sounds normal. No  respiratory distress. She has no wheezes. She has no rales.          Assessment & Plan:  1. Acute rhinosinusitis I discussed the risk and benefits of using prednisone with the patient. She is willing to accept the risk and monitor her sugars very closely. She is to call me if her sugars get higher than 300. We'll begin a  prednisone taper pack coupled with Augmentin 875 mg tablets by mouth twice a day for 10 days. Recheck in 1 week if no better. Consider a CT scan of the sinuses if no better the - predniSONE (DELTASONE) 20 MG tablet; 3 tabs poqday 1-2, 2 tabs poqday 3-4, 1 tab poqday 5-6  Dispense: 12 tablet; Refill: 0 - amoxicillin-clavulanate (AUGMENTIN) 875-125 MG per tablet; Take 1 tablet by mouth 2 (two) times daily.  Dispense: 20 tablet; Refill: 0

## 2012-08-29 ENCOUNTER — Telehealth: Payer: Self-pay | Admitting: Family Medicine

## 2012-08-29 NOTE — Telephone Encounter (Signed)
Pt aware to take 6 units of Novolog

## 2012-08-29 NOTE — Telephone Encounter (Signed)
She should take 6 units of NovoLog for sugars greater than 300.  Check sugars prior to each meal

## 2012-09-19 ENCOUNTER — Telehealth: Payer: Self-pay | Admitting: Family Medicine

## 2012-09-19 DIAGNOSIS — J019 Acute sinusitis, unspecified: Secondary | ICD-10-CM

## 2012-09-20 MED ORDER — FLUTICASONE PROPIONATE 50 MCG/ACT NA SUSP: 2.0000 | Freq: Every day | NASAL | Status: DC

## 2012-09-20 MED ORDER — LOSARTAN POTASSIUM-HCTZ 50-12.5 MG PO TABS: 1.0000 | ORAL_TABLET | Freq: Every day | ORAL | Status: DC

## 2012-09-20 MED ORDER — LEVOTHYROXINE SODIUM 100 MCG PO TABS: 100.0000 ug | ORAL_TABLET | Freq: Every day | ORAL | Status: DC

## 2012-09-20 NOTE — Telephone Encounter (Signed)
Rx Refilled  

## 2012-11-16 ENCOUNTER — Ambulatory Visit (INDEPENDENT_AMBULATORY_CARE_PROVIDER_SITE_OTHER): Payer: Medicare Other | Admitting: Family Medicine

## 2012-11-16 ENCOUNTER — Encounter: Payer: Self-pay | Admitting: Family Medicine

## 2012-11-16 VITALS — BP 122/70 | HR 82 | Temp 98.3°F | Resp 18 | Wt 205.0 lb

## 2012-11-16 DIAGNOSIS — H109 Unspecified conjunctivitis: Secondary | ICD-10-CM

## 2012-11-16 MED ORDER — POLYMYXIN B-TRIMETHOPRIM 10000-0.1 UNIT/ML-% OP SOLN: 1.0000 [drp] | OPHTHALMIC | Status: DC

## 2012-11-16 NOTE — Progress Notes (Signed)
Subjective:    Patient ID: [name], female    DOB: 1950-09-13, 62 y.o.   MRN: [id number]  HPI Patient reports 3 days of pain and swelling in both eyes. Both conjunctiva are injected and red. She denies any photophobia. She denies any blurred vision. She does report mucopurulent discharge from both eyes. She also reports pain and sensitivity in her left upper eyelid. There is no evidence of erythema or cellulitis in the eyelids or periorbital area. She also has a recent head cold. Otherwise she feels fine. Past Medical History  Diagnosis Date  . GERD (gastroesophageal reflux disease)   . Hypothyroidism   . Peripheral neuropathy   . Cardiomyopathy   . Chest pain 06/2005    Hospitalized, dystolic dysfunction  . Sciatica   . Type II diabetes mellitus   . PONV (postoperative nausea and vomiting)   . H/O hiatal hernia   . Migraines     "it's been a long time since I've had one" (03/20/2012)  . Osteoarthritis   . Arthritis     "all over; mostly in my back" (03/20/2012)  . Chronic lower back pain     "have 2 herniated discs; going to have to have a fusion" (03/20/2012)  . Kidney stones     "never had OR" (03/20/2012)  . Breast cancer     "right" (03/20/2012)  . Family history of anesthesia complication     "father has nausea and vomiting"  . Anginal pain 03/19/2012    saw Dr. Cathie Olden  . Irritable bowel syndrome   . Hypertension     "patient states never had HTN, takes Hyzaar for heart   Current Outpatient Prescriptions on File Prior to Visit  Medication Sig Dispense Refill  . aspirin 81 MG tablet Take 81 mg by mouth daily.      . celecoxib (CELEBREX) 200 MG capsule Take 200 mg by mouth daily.      . Cholecalciferol (VITAMIN D) 2000 UNITS CAPS Take 4,000 Units by mouth daily.      [name] Kitchen docusate sodium 100 MG CAPS Take 100 mg by mouth 2 (two) times daily.  10 capsule    . fluticasone (FLONASE) 50 MCG/ACT nasal spray Place 2 sprays into the nose daily.  48 g  1  . insulin aspart  (NOVOLOG) 100 UNIT/ML injection Inject 15 Units into the skin 3 (three) times daily before meals.      . insulin glargine (LANTUS) 100 UNIT/ML injection Inject 80 Units into the skin at bedtime.       [name] Kitchen levothyroxine (SYNTHROID, LEVOTHROID) 100 MCG tablet Take 1 tablet (100 mcg total) by mouth daily.  90 tablet  1  . losartan-hydrochlorothiazide (HYZAAR) 50-12.5 MG per tablet Take 1 tablet by mouth daily.  90 tablet  1  . metFORMIN (GLUCOPHAGE) 850 MG tablet Take 850 mg by mouth 2 (two) times daily with a meal.       . nitroGLYCERIN (NITROSTAT) 0.4 MG SL tablet Place 0.4 mg under the tongue every 5 (five) minutes as needed. Chest pain      . oxyCODONE-acetaminophen (PERCOCET/ROXICET) 5-325 MG per tablet Take 1 tablet by mouth every 4 (four) hours as needed. For pain      . pregabalin (LYRICA) 150 MG capsule Take 150 mg by mouth 2 (two) times daily.        No current facility-administered medications on file prior to visit.   Allergies  Allergen Reactions  . Dilaudid (Hydromorphone Hcl) Nausea And Vomiting  .  Penicillins   . Ampicillin Rash    REACTION: Rash and welts on her hands when she took it in the hospital  . Latex Itching, Dermatitis and Rash    REACTION: Patient had a reaction to tape after surgery and they told her she was allergic to Latex 03/20/2012 "if it's on too long it will break me out"   History   Social History  . Marital Status: Married    Spouse Name: N/A    Number of Children: 1  . Years of Education: N/A   Occupational History  . Retired since 2003    Social History Main Topics  . Smoking status: Never Smoker   . Smokeless tobacco: Never Used  . Alcohol Use: No  . Drug Use: No  . Sexually Active: Not Currently   Other Topics Concern  . Not on file   Social History Narrative  . No narrative on file      Review of Systems  All other systems reviewed and are negative.       Objective:   Physical Exam  Constitutional: She appears  well-developed and well-nourished.  Eyes: Lids are normal. Pupils are equal, round, and reactive to light. No foreign bodies found. Right eye exhibits no exudate and no hordeolum. Left eye exhibits exudate. Left eye exhibits no hordeolum. Right conjunctiva is injected. Right conjunctiva has no hemorrhage. Left conjunctiva is not injected. Left conjunctiva has no hemorrhage. Right eye exhibits normal extraocular motion. Left eye exhibits normal extraocular motion.  Cardiovascular: Normal rate and regular rhythm.   Pulmonary/Chest: Effort normal and breath sounds normal.          Assessment & Plan:  1. Conjunctivitis - trimethoprim-polymyxin b (POLYTRIM) ophthalmic solution; Place 1 drop into both eyes every 4 (four) hours for 7 days.  Patient was given instructions regarding infection precautions. Dispense: 10 mL; Refill: 0

## 2013-03-15 ENCOUNTER — Other Ambulatory Visit: Payer: Self-pay | Admitting: Family Medicine

## 2013-03-19 ENCOUNTER — Other Ambulatory Visit: Payer: Self-pay | Admitting: Family Medicine

## 2013-03-19 ENCOUNTER — Encounter: Payer: Self-pay | Admitting: Family Medicine

## 2013-03-19 DIAGNOSIS — I10 Essential (primary) hypertension: Secondary | ICD-10-CM

## 2013-03-19 DIAGNOSIS — K760 Fatty (change of) liver, not elsewhere classified: Secondary | ICD-10-CM

## 2013-03-19 DIAGNOSIS — E119 Type 2 diabetes mellitus without complications: Secondary | ICD-10-CM

## 2013-03-19 NOTE — Progress Notes (Signed)
Record review show patine has not had a Lipid Panel done recently.  Lipid panel was ordered and patient sent letter to come have this done

## 2013-03-26 IMAGING — CT CT ABD-PELV W/ CM
3 of 5 series · 13 of 36 positions shown, 19 images · IV contrast (READICAT/WATER & [ID] OMNI 300)
Comparison: 02/25/2010

CLINICAL DATA: Right lower quadrant abdominal pain for 3 days.
Diarrhea.  History of renal calculi.  Post total abdominal
hysterectomy.  Squamous cell carcinoma of the skin under the right
axilla with recurrence to the right breast.

CT ABDOMEN AND PELVIS WITH CONTRAST
TECHNIQUE: Multidetector CT imaging of the abdomen and pelvis was
performed following the standard protocol during bolus
administration of intravenous contrast.
Contrast: 125 ml of Omnipaque 300% and oral contrast

[Series 3: routine abdomen · axial · 0.82mm/px · z∈[-373,-48]mm · 7 of 84 slices shown, 12 images]
[im 11/84  soft-tissue]
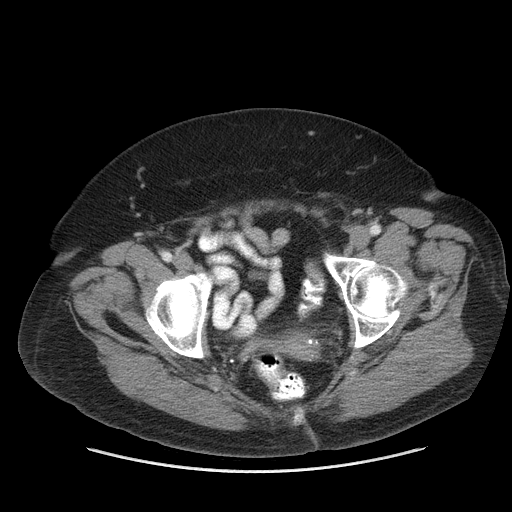
[im 11/84  bone]
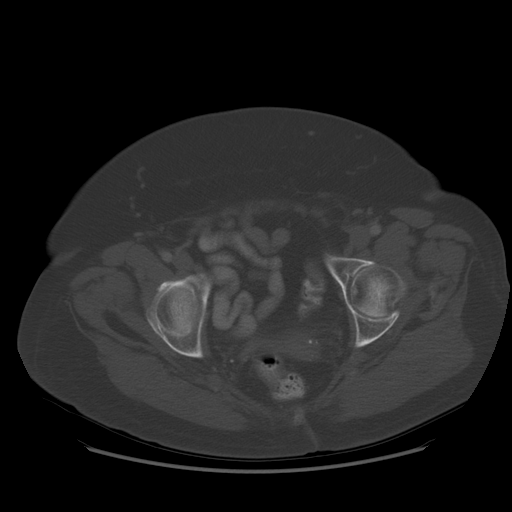
[im 21/84  soft-tissue]
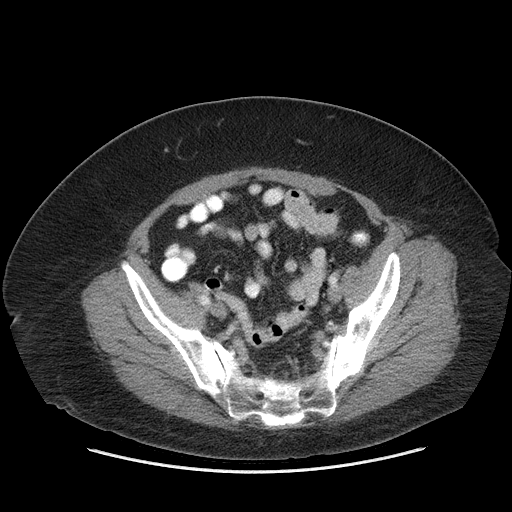
[im 32/84  soft-tissue]
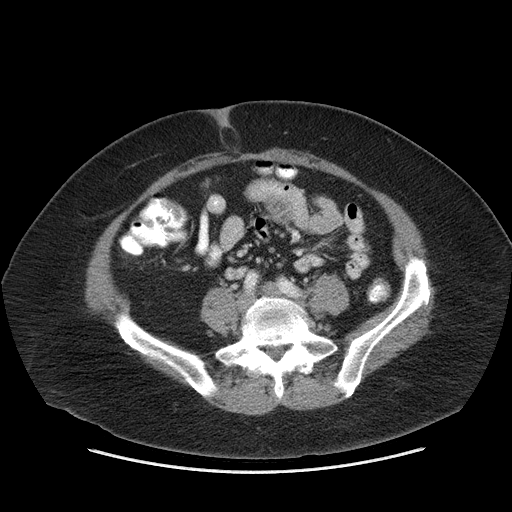
[im 42/84  soft-tissue]
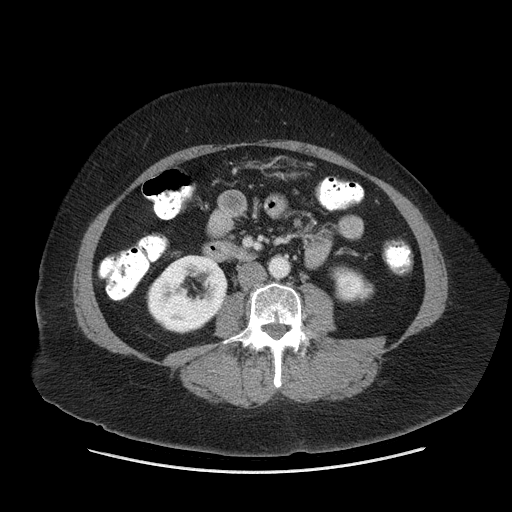
[im 42/84  lung]
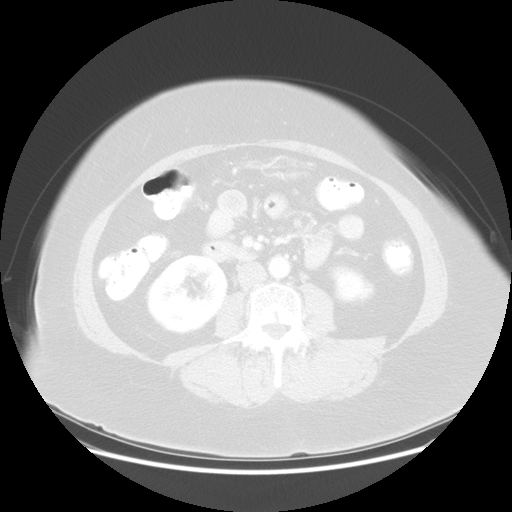
[im 52/84  soft-tissue]
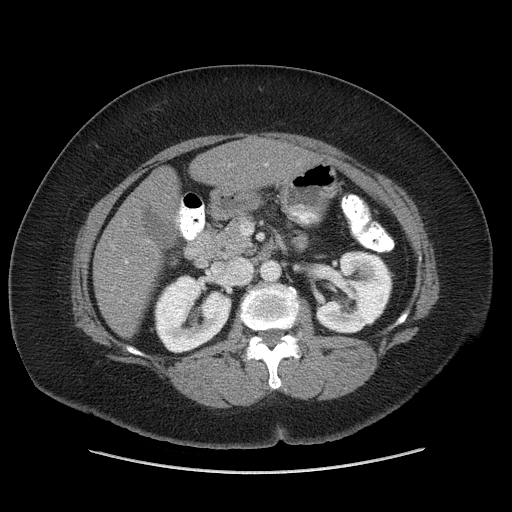
[im 52/84  lung]
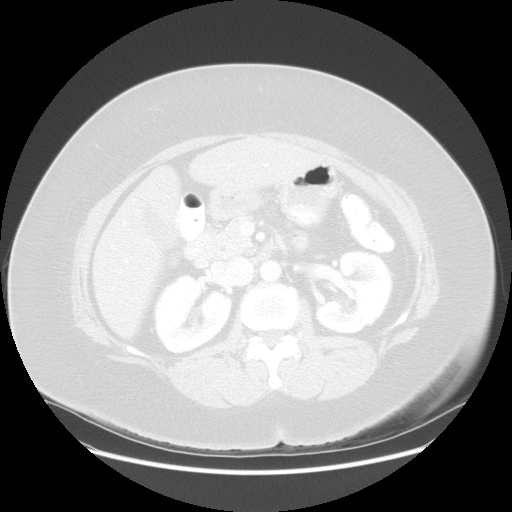
[im 63/84  soft-tissue]
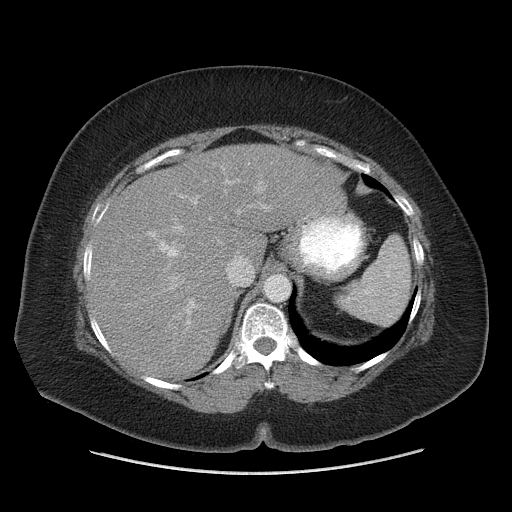
[im 63/84  lung]
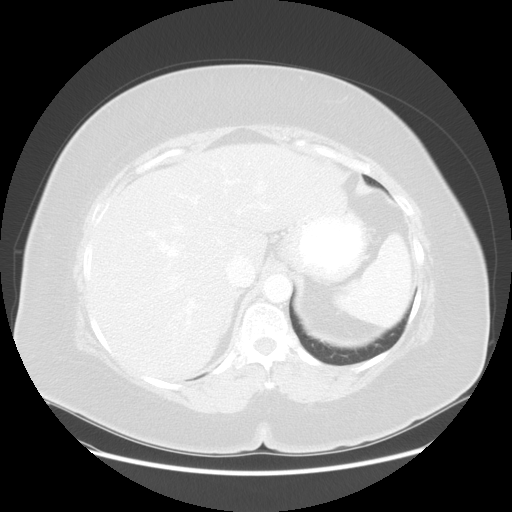
[im 73/84  soft-tissue]
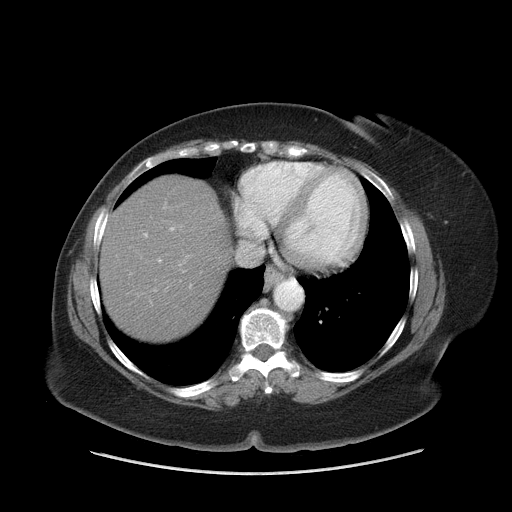
[im 73/84  lung]
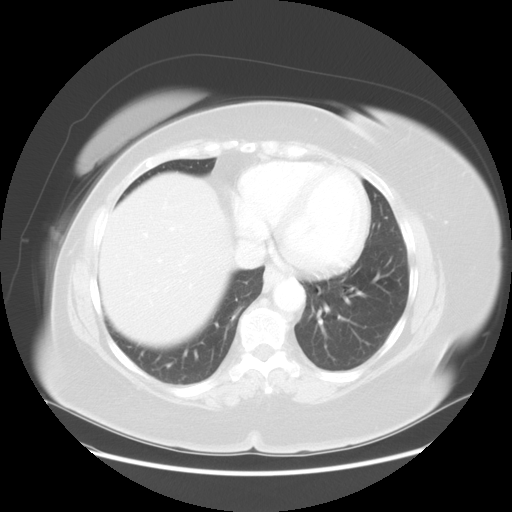

[Series 601: coronal body · coronal · 0.85mm/px · 1 of 134 slices shown, 2 images]
[im 45/134  soft-tissue]
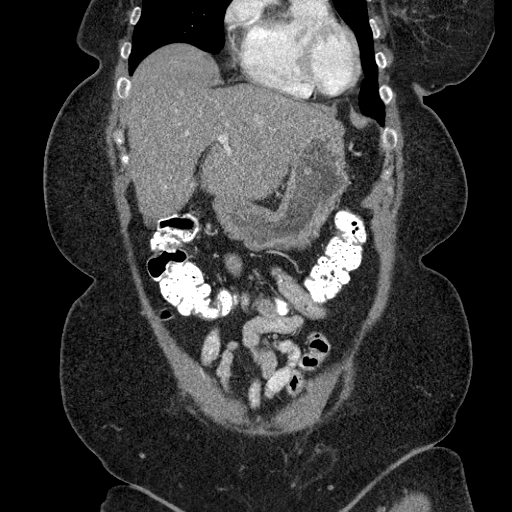
[im 45/134  bone]
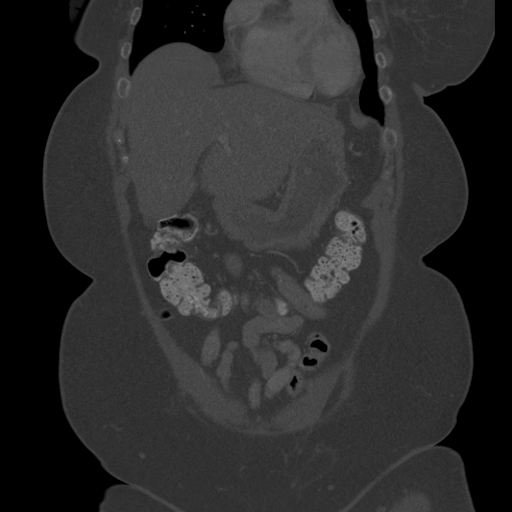

[Series 602: sagittal body · sagittal · 0.85mm/px · 5 of 169 slices shown]
[im 11/169  soft-tissue]
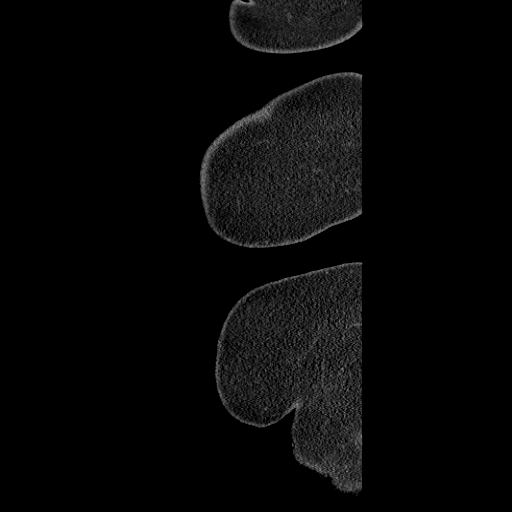
[im 32/169  soft-tissue]
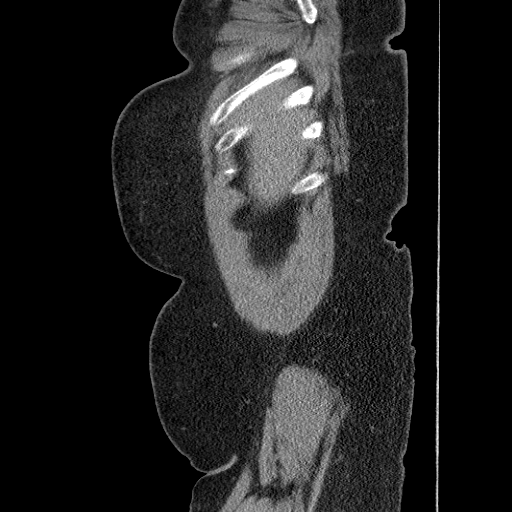
[im 53/169  soft-tissue]
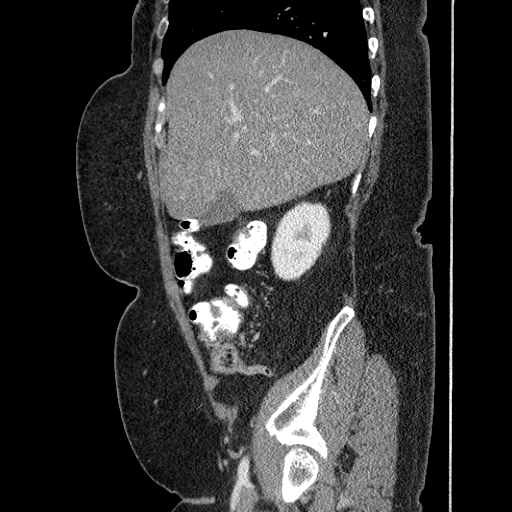
[im 74/169  soft-tissue]
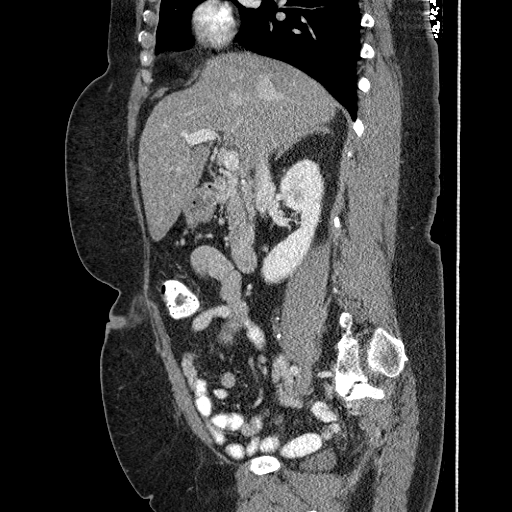
[im 95/169  soft-tissue]
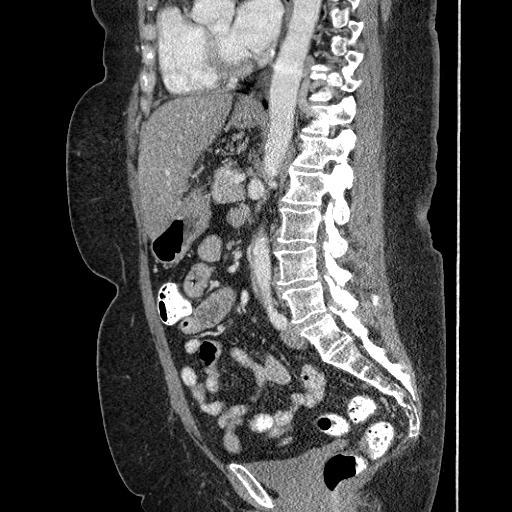

[13 of 36 positions shown; findings below may reference images not displayed]

FINDINGS: The lung bases appear clear. The patient is status post
right mastectomy

Diffuse hepatic steatosis is noted.  No focal hepatic parenchymal
abnormality is seen.  The gallbladder, spleen, pancreas, and
adrenal glands have a normal post contrasted appearance. Two
nonobstructing left renal calculi are again seen and are unchanged
in size and position. The kidneys are otherwise stable with
bilateral symmetric excretion seen.

Adequate bowel contrast was achieved.   No focal small bowel
abnormalities are noted with a normal appearance to the ileocecal
valve.  The appendix is upper limits of normal in size at 6 mm but
no periappendiceal inflammatory change or fluid is seen to suggest
appendicitis.  No focal colonic abnormality is noted.

The pelvis has a normal posthysterectomy appearance.  No intra-
abdominal or pelvic fluid, inflammatory change or pathologic
adenopathy is noted.

A small fat containing umbilical hernia is seen.  Bilateral fat
containing inguinal hernias are identified.  Degenerative bony
changes of the lower thoracic and lumbar spine are seen.  No
worrisome bone lesions are evident.
IMPRESSION: No focal abnormality is seen to explain the patient's right lower
quadrant pain.

Stable nonobstructing left renal calculi. Diffuse hepatic
steatosis.

Bilateral fat containing inguinal hernias and small fat containing
umbilical hernia.

## 2013-04-12 IMAGING — US US ABDOMEN COMPLETE
1 series · 14 of 25 positions shown · non-contrast
Comparison: CT on 12/03/2010

CLINICAL DATA: Right-sided abdominal pain.  Breast cancer.
Diabetes and hypertension.

ABDOMINAL ULTRASOUND COMPLETE

[Series 1: us abdomen complete · 0.37mm/px · 14 of 99 slices shown]
[im 1/99]
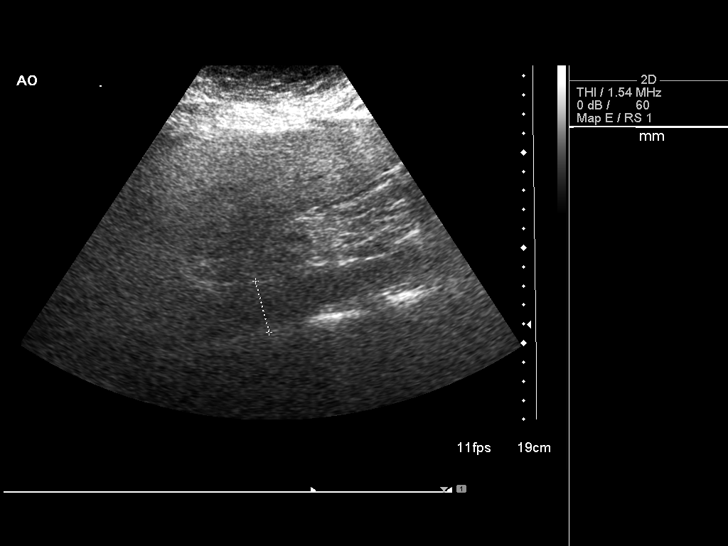
[im 9/99]
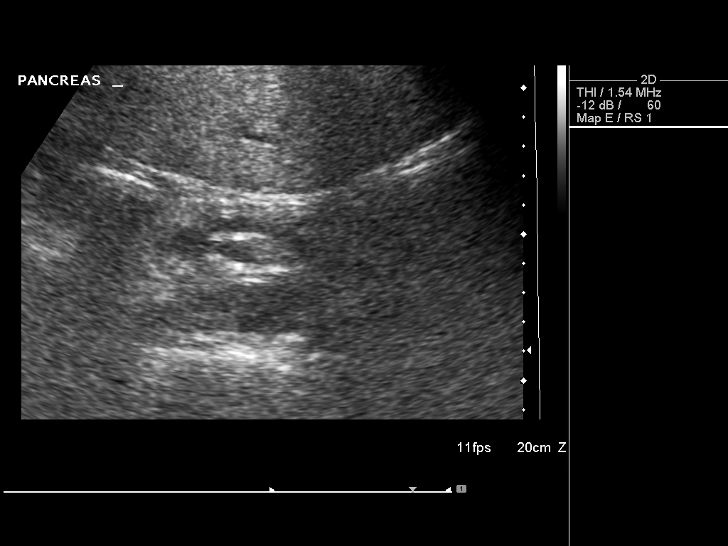
[im 17/99]
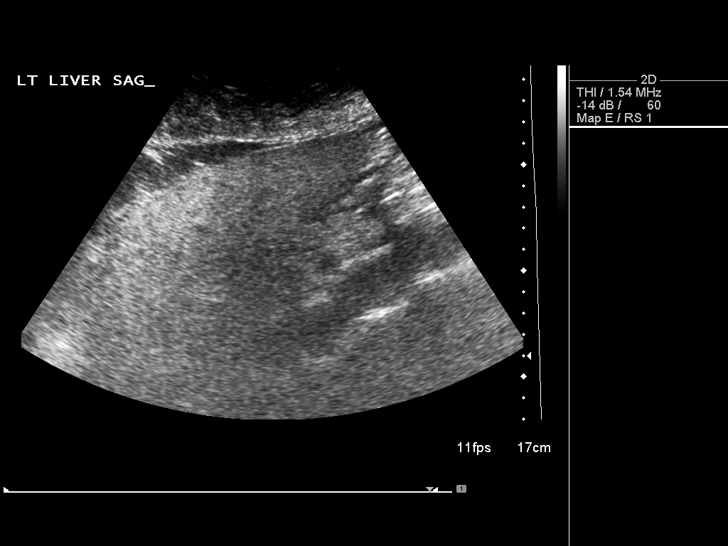
[im 25/99]
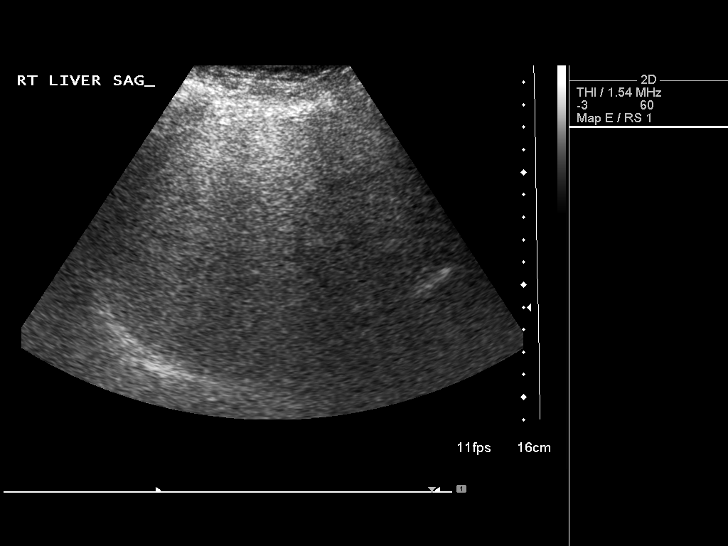
[im 33/99]
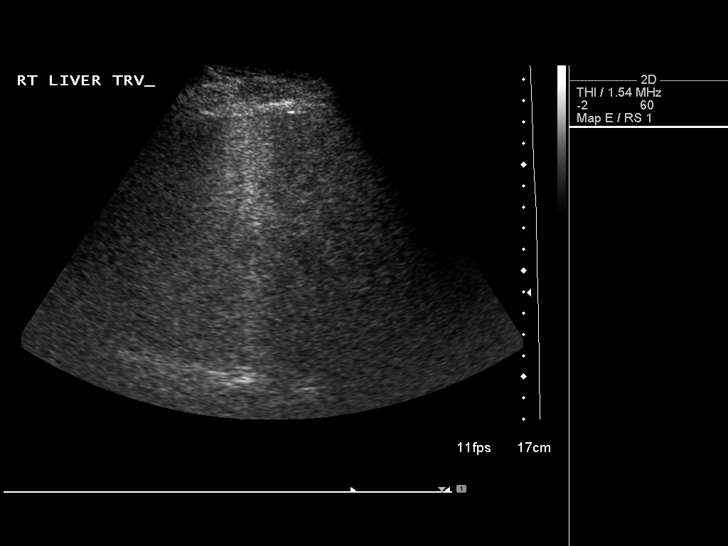
[im 37/99]
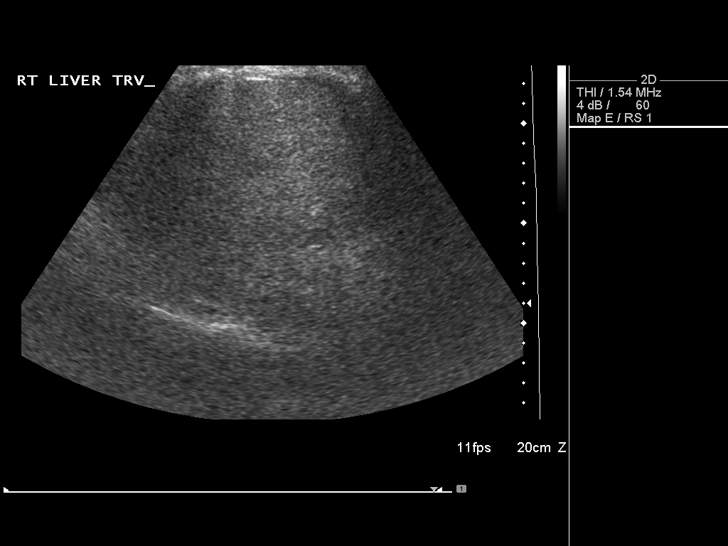
[im 45/99]
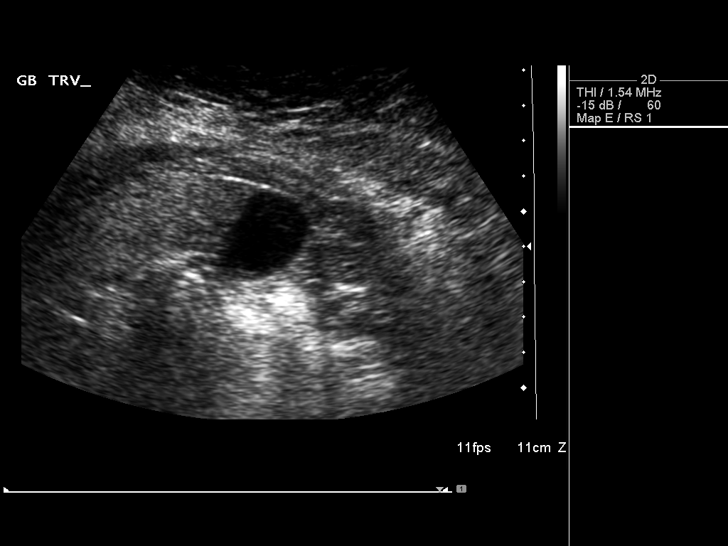
[im 54/99]
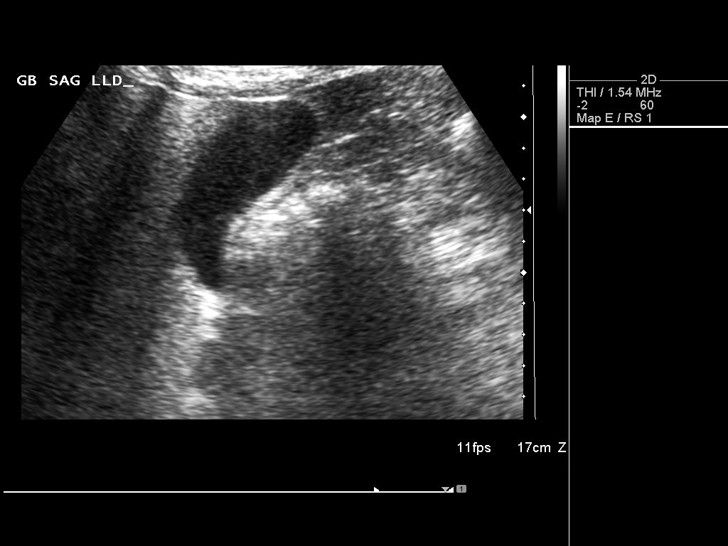
[im 62/99]
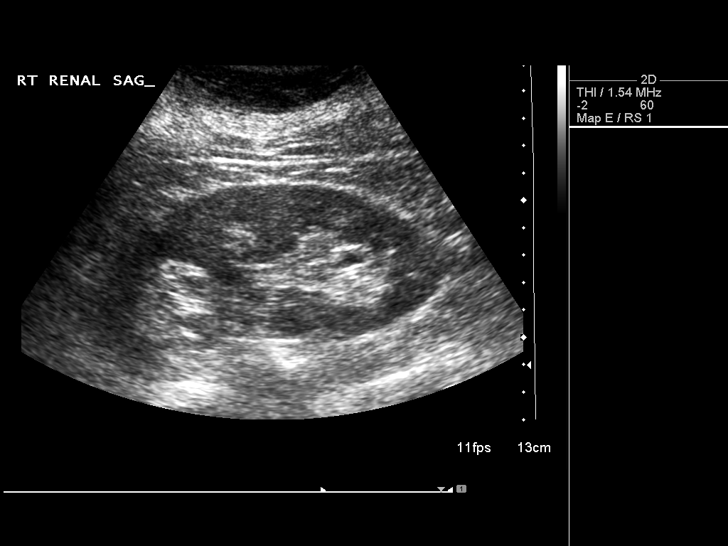
[im 66/99]
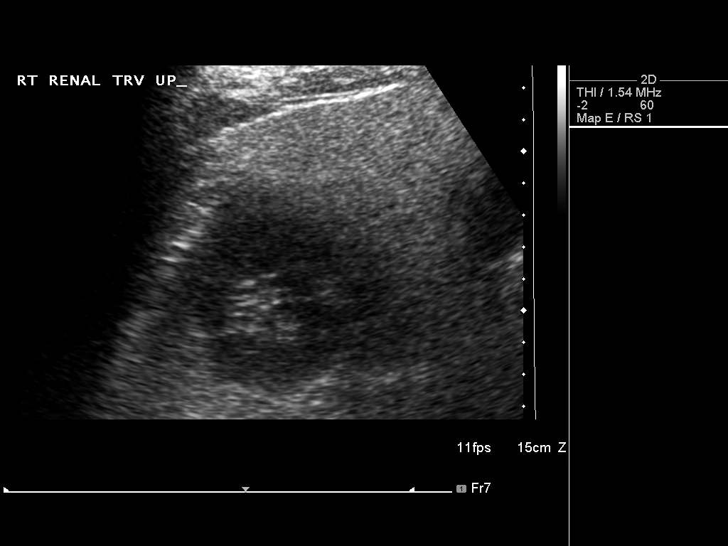
[im 74/99]
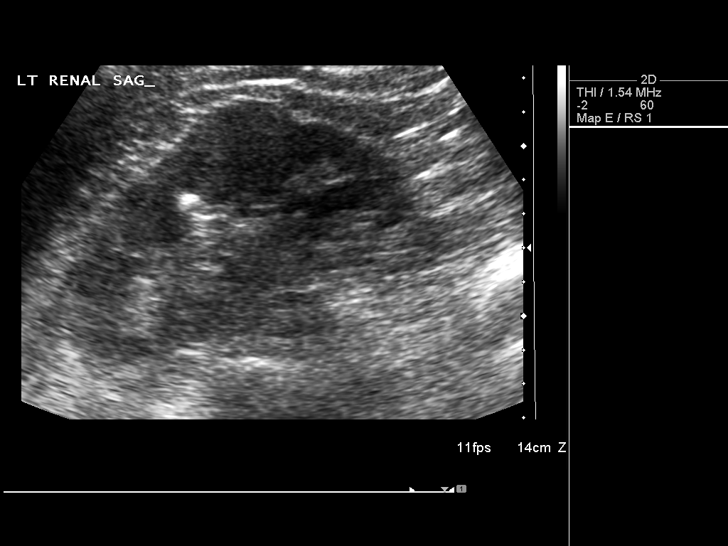
[im 82/99]
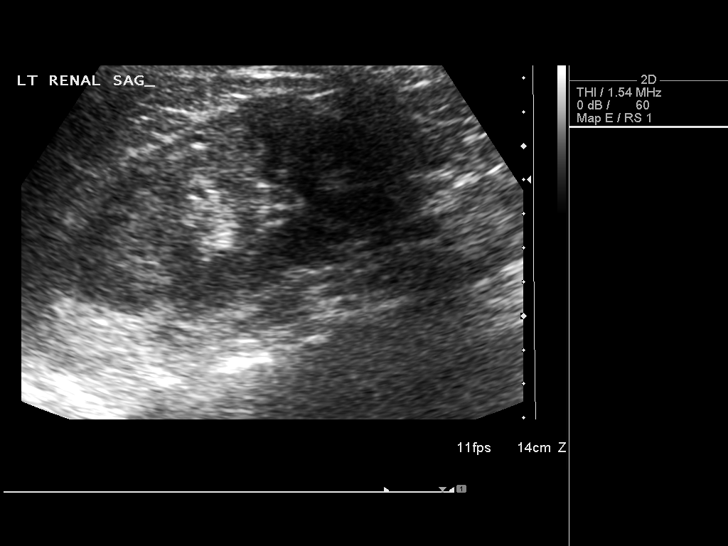
[im 90/99]
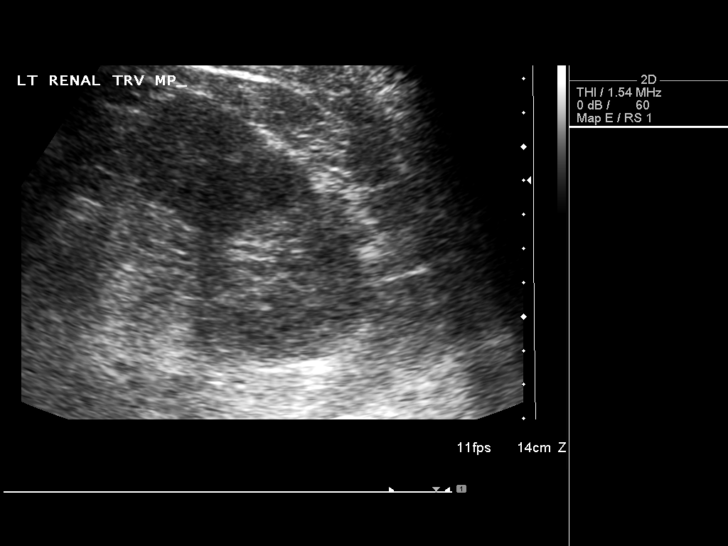
[im 99/99]
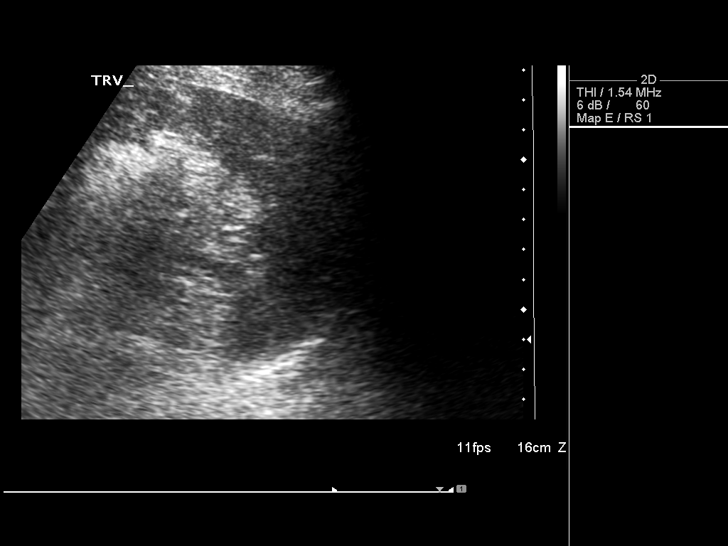

[14 of 25 positions shown; findings below may reference images not displayed]

FINDINGS: Gallbladder:  No gallstones, gallbladder wall thickening, or
pericholecystic fluid.

Common Bile Duct:  Within normal limits in caliber. Measures 5 mm
in diameter.

Liver: Diffusely increased parenchymal echogenicity, consistent
with hepatic steatosis.  No focal liver mass identified.

IVC:  Appears normal.

Pancreas:  No abnormality identified.

Spleen:  Within normal limits in size and echotexture.

Right kidney:  Normal in size and parenchymal echogenicity.  No
evidence of mass or hydronephrosis.

Left kidney:  Normal in size and parenchymal echogenicity.  No
evidence of mass or hydronephrosis.  Incidental note is made of at
least one 1 cm nonobstructing intrarenal calculus.  Incidental note
is also made of a dromedary hump.

Abdominal Aorta:  No aneurysm identified.
IMPRESSION: 1.  No evidence of gallstones, biliary ductal dilatation, or other
acute findings.
2.  Hepatic steatosis.
3.  Nonobstructing left renal calculus.  No evidence of
hydronephrosis.

## 2013-05-22 ENCOUNTER — Other Ambulatory Visit: Payer: Self-pay

## 2013-05-31 ENCOUNTER — Other Ambulatory Visit: Payer: Self-pay

## 2013-06-03 ENCOUNTER — Telehealth: Payer: Self-pay | Admitting: *Deleted

## 2013-06-03 NOTE — Telephone Encounter (Signed)
Received refill request for Lyrica from patient's pharmacy. I called the pt and LM to inform her that it has been refilled but has to be hand delivered to the pharmacy. I informed her that the Rx is ready and will be available at her convenience to pick up.

## 2013-06-12 IMAGING — CR DG CERVICAL SPINE COMPLETE 4+V
6 series · 6 of 6 positions shown · non-contrast
Comparison: None

CLINICAL DATA: Chronic neck pain

CERVICAL SPINE - COMPLETE 4+ VIEW

[w c-spine lat]
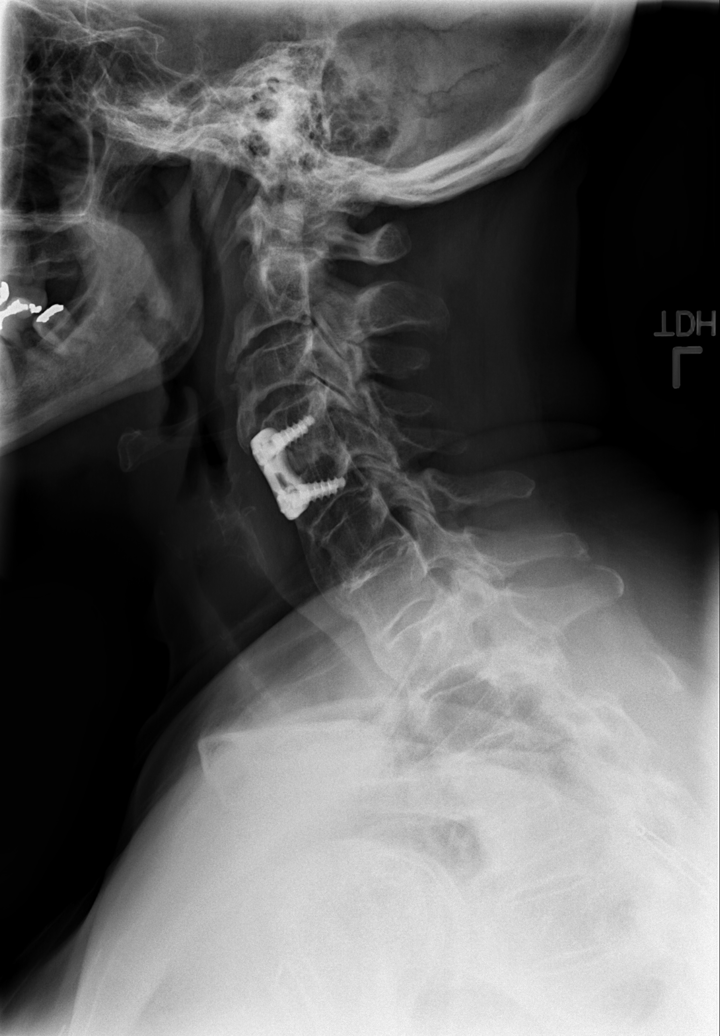

[w c-spine oblique (1 of 2)]
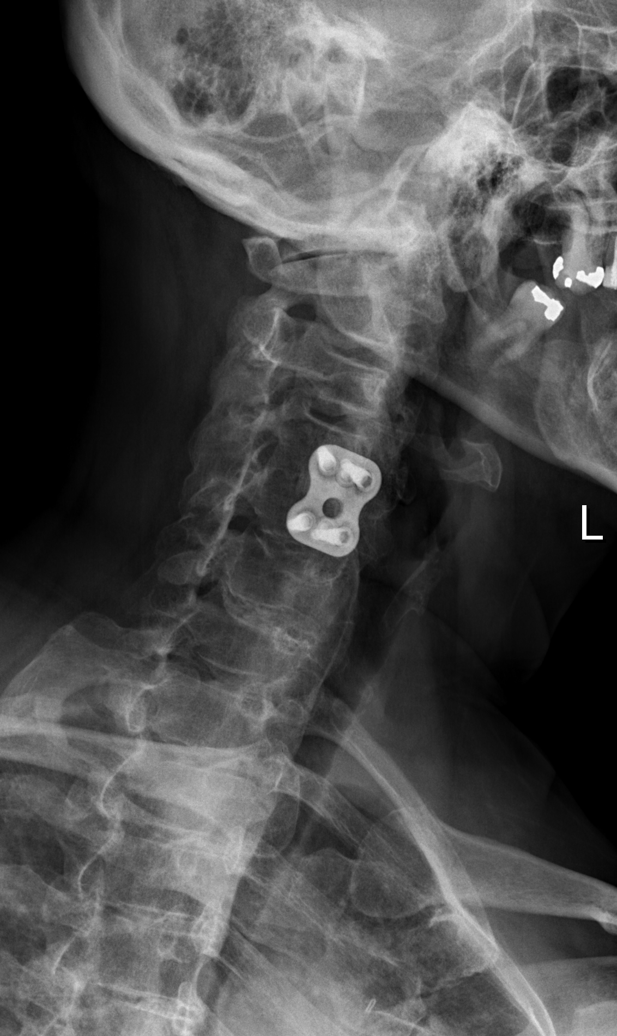

[w c-spine oblique (2 of 2)]
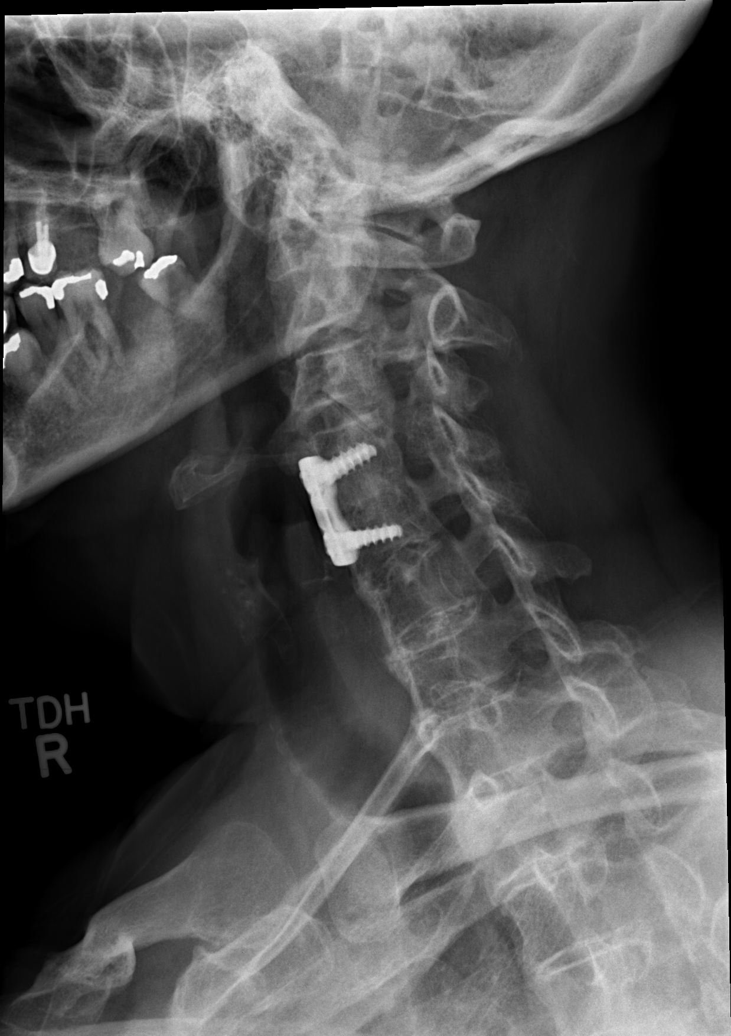

[w c-spine odontoid (1 of 2)]
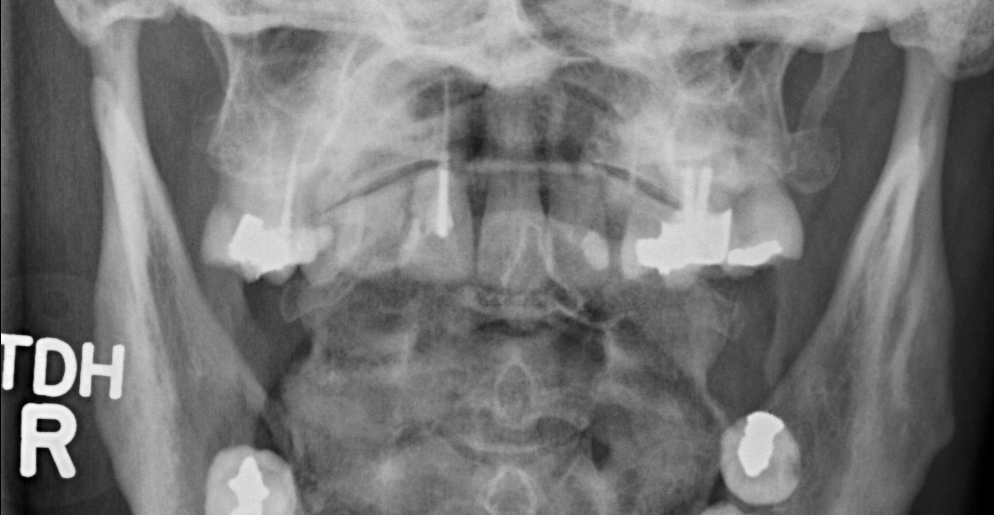

[w c-spine a.p.]
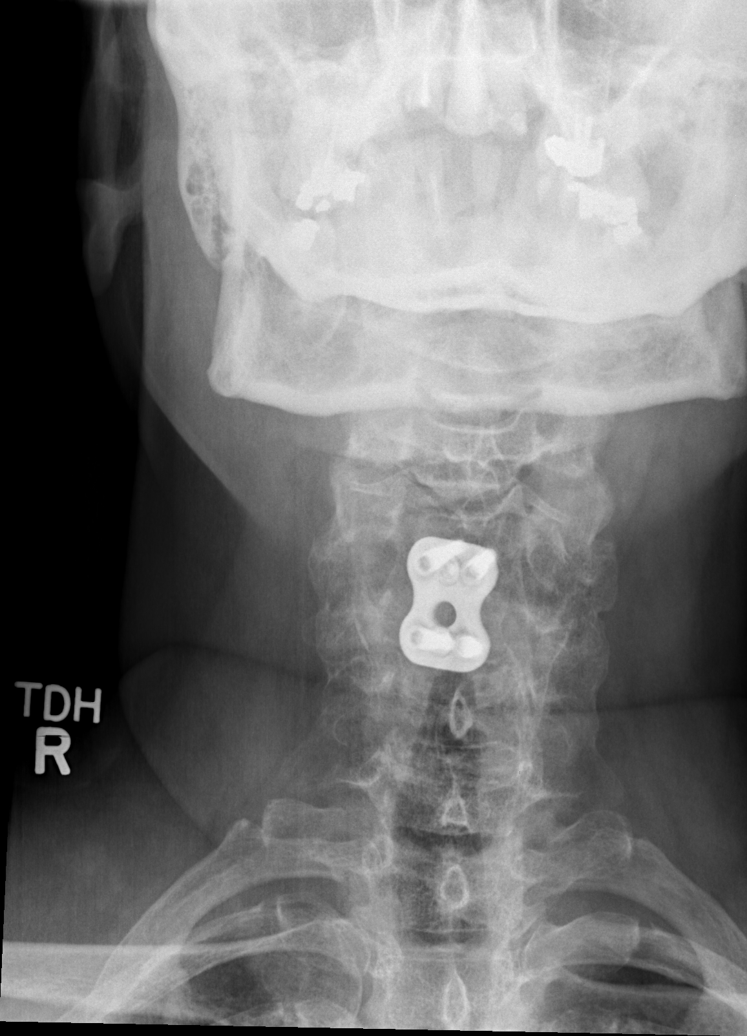

[w c-spine odontoid (2 of 2)]
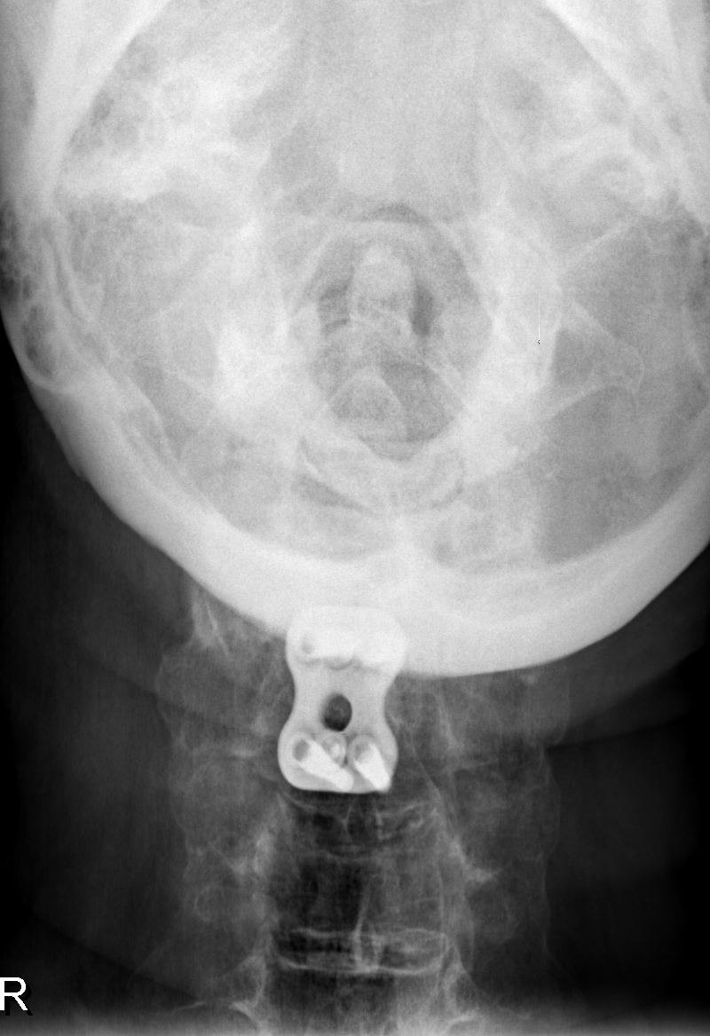

[6 of 6 positions shown; findings below may reference images not displayed]

FINDINGS: No prevertebral soft tissue swelling.  There is anterior
cervical fusion at C4-C5 which is stable.  There is anterior
flowing osteophytosis present.  No subluxation.

.  Oblique projections demonstrate no significant neural foraminal
narrowing.  Open mouth odontoid view is partially obscured by
dental amalgam.
IMPRESSION: 1.  No acute findings in neck. Multilevel disc osteophytic disease.

2.  Stable anterior cervical fusion.

## 2013-06-12 IMAGING — CR DG LUMBAR SPINE COMPLETE 4+V
5 series · 5 of 5 positions shown · non-contrast
Comparison: None.

CLINICAL DATA: Neck and low back pain

LUMBAR SPINE - COMPLETE 4+ VIEW

[t l-spine a.p.]
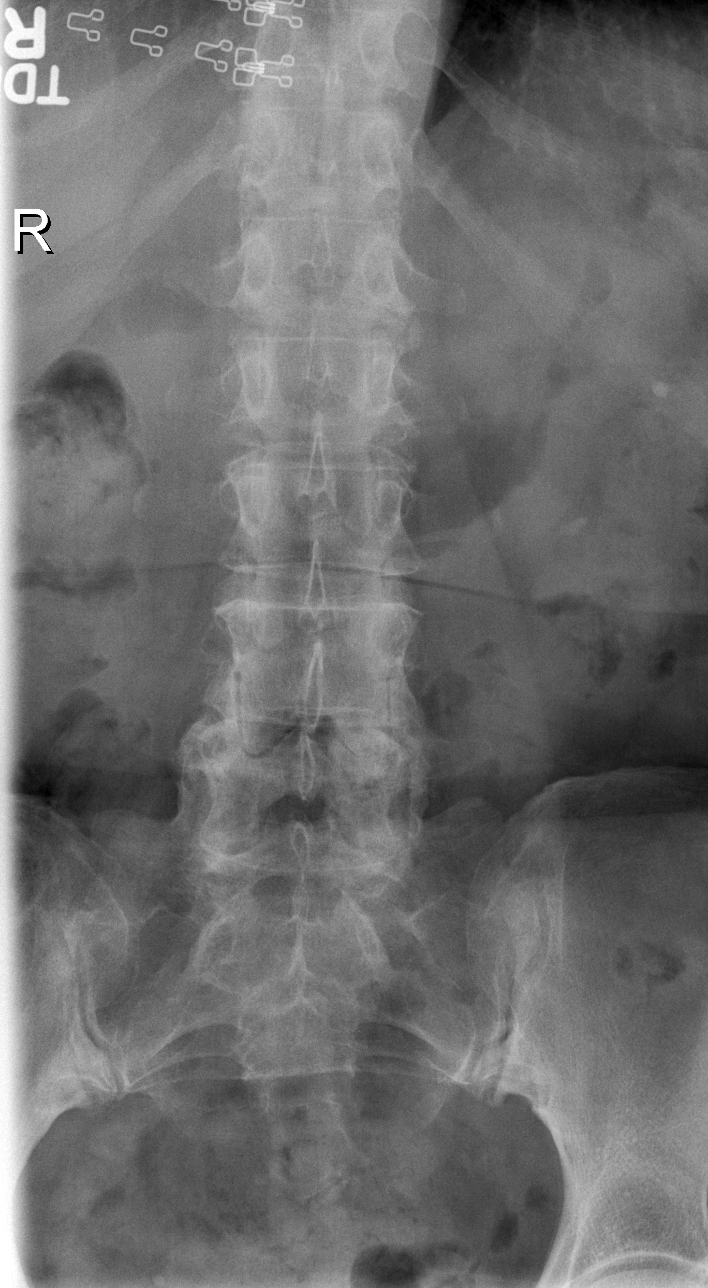

[t l-spine oblique exposure (1 of 2)]
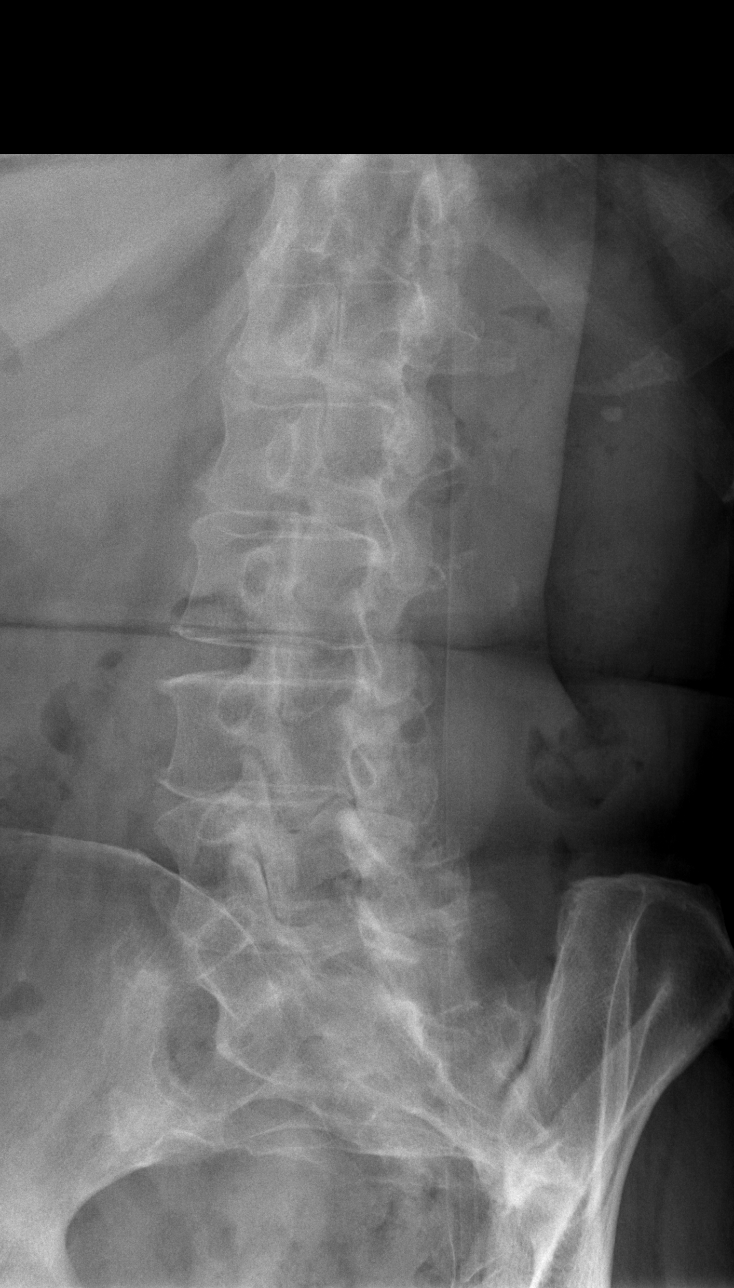

[t l-spine oblique exposure (2 of 2)]
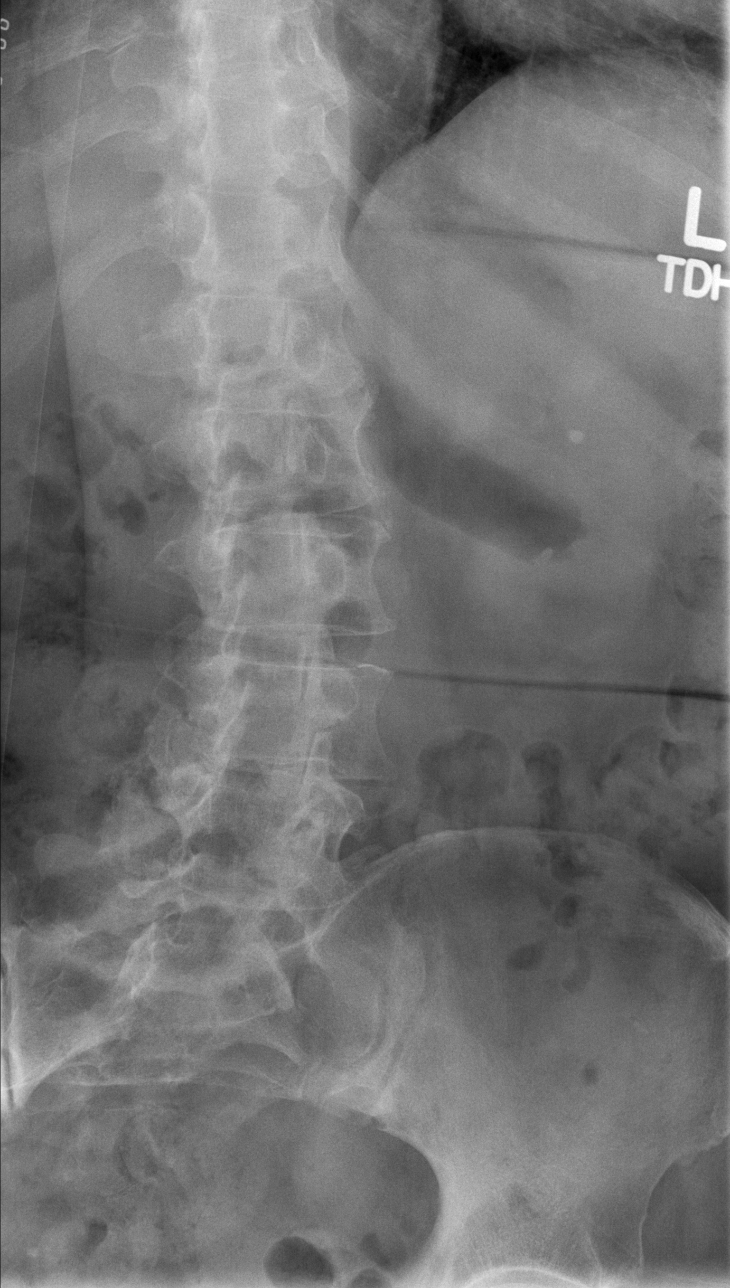

[t l-spine lat *]
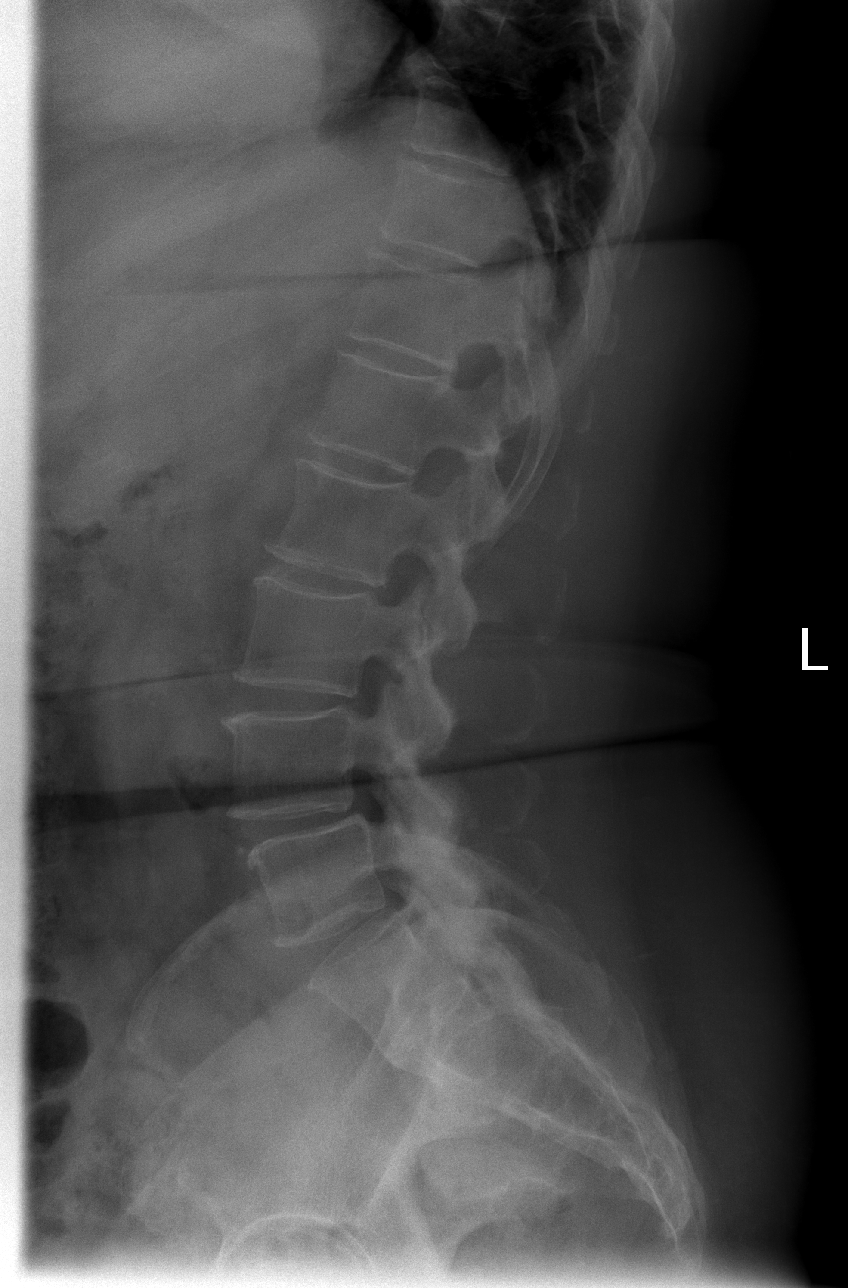

[t l-spine l5-s1 spot *]
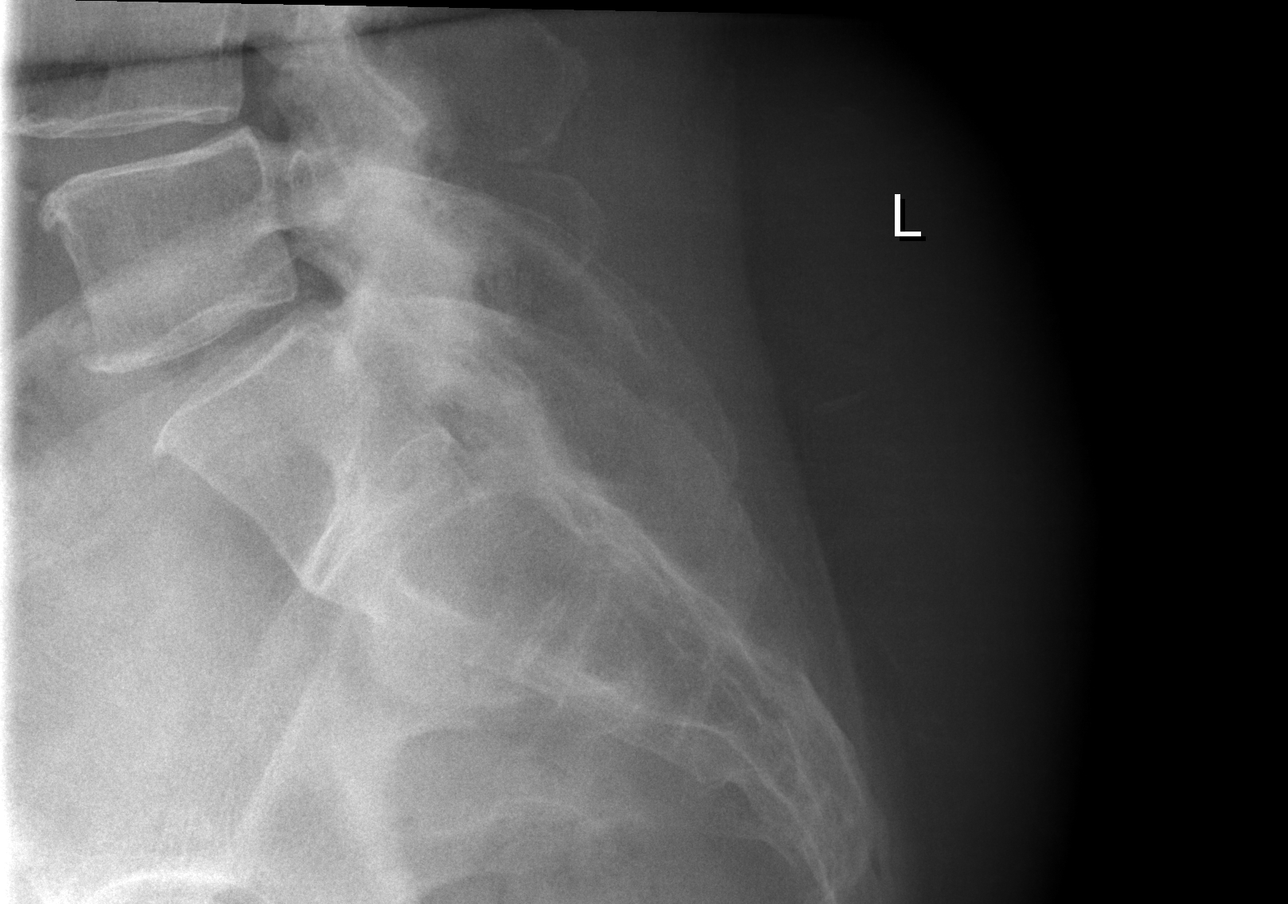

[5 of 5 positions shown; findings below may reference images not displayed]

FINDINGS: Normal alignment of lumbar vertebral bodies.  There is
mild endplate spurring at L4.  No subluxation.  No pars fracture.
Mild facet hypertrophy and L5-S1.
IMPRESSION: No acute findings lumbar spine.

Mild disc osteophytic disease and facet hypertrophy.

## 2013-08-26 ENCOUNTER — Encounter: Payer: Self-pay | Admitting: Family Medicine

## 2013-10-19 ENCOUNTER — Other Ambulatory Visit: Payer: Self-pay | Admitting: Family Medicine

## 2013-10-19 DIAGNOSIS — Z79899 Other long term (current) drug therapy: Secondary | ICD-10-CM

## 2013-10-19 DIAGNOSIS — K760 Fatty (change of) liver, not elsewhere classified: Secondary | ICD-10-CM

## 2013-10-19 DIAGNOSIS — I10 Essential (primary) hypertension: Secondary | ICD-10-CM

## 2013-10-19 DIAGNOSIS — E119 Type 2 diabetes mellitus without complications: Secondary | ICD-10-CM

## 2013-10-25 ENCOUNTER — Other Ambulatory Visit: Payer: Medicare Other

## 2013-10-25 DIAGNOSIS — I10 Essential (primary) hypertension: Secondary | ICD-10-CM

## 2013-10-25 DIAGNOSIS — K760 Fatty (change of) liver, not elsewhere classified: Secondary | ICD-10-CM

## 2013-10-25 DIAGNOSIS — Z79899 Other long term (current) drug therapy: Secondary | ICD-10-CM

## 2013-10-25 DIAGNOSIS — E119 Type 2 diabetes mellitus without complications: Secondary | ICD-10-CM

## 2013-10-25 LAB — LIPID PANEL
Cholesterol: 179 mg/dL (ref 0–200)
HDL: 48 mg/dL (ref >39)
LDL CALC: 94 mg/dL (ref 0–99)
Total CHOL/HDL Ratio: 3.7 Ratio
Triglycerides: 187 mg/dL — ABNORMAL HIGH (ref <150)
VLDL: 37 mg/dL (ref 0–40)

## 2013-10-25 LAB — CBC WITH DIFFERENTIAL/PLATELET
Basophils Absolute: 0.1 10*3/uL (ref 0.0–0.1)
Basophils Relative: 1 % (ref 0–1)
EOS PCT: 4 % (ref 0–5)
Eosinophils Absolute: 0.3 10*3/uL (ref 0.0–0.7)
HCT: 43.3 % (ref 36.0–46.0)
HEMOGLOBIN: 14.5 g/dL (ref 12.0–15.0)
LYMPHS ABS: 2.8 10*3/uL (ref 0.7–4.0)
LYMPHS PCT: 41 % (ref 12–46)
MCH: 29.5 pg (ref 26.0–34.0)
MCHC: 33.5 g/dL (ref 30.0–36.0)
MCV: 88.2 fL (ref 78.0–100.0)
MONOS PCT: 8 % (ref 3–12)
Monocytes Absolute: 0.5 10*3/uL (ref 0.1–1.0)
Neutro Abs: 3.1 10*3/uL (ref 1.7–7.7)
Neutrophils Relative %: 46 % (ref 43–77)
PLATELETS: 235 10*3/uL (ref 150–400)
RBC: 4.91 MIL/uL (ref 3.87–5.11)
RDW: 14.6 % (ref 11.5–15.5)
WBC: 6.8 10*3/uL (ref 4.0–10.5)

## 2013-10-25 LAB — COMPREHENSIVE METABOLIC PANEL
ALT: 17 U/L (ref 0–35)
AST: 14 U/L (ref 0–37)
Albumin: 3.5 g/dL (ref 3.5–5.2)
Alkaline Phosphatase: 65 U/L (ref 39–117)
BUN: 16 mg/dL (ref 6–23)
CALCIUM: 9.8 mg/dL (ref 8.4–10.5)
CHLORIDE: 103 meq/L (ref 96–112)
CO2: 27 meq/L (ref 19–32)
CREATININE: 0.71 mg/dL (ref 0.50–1.10)
GLUCOSE: 313 mg/dL — AB (ref 70–99)
Potassium: 4.6 mEq/L (ref 3.5–5.3)
SODIUM: 138 meq/L (ref 135–145)
TOTAL PROTEIN: 6.9 g/dL (ref 6.0–8.3)
Total Bilirubin: 0.4 mg/dL (ref 0.2–1.2)

## 2013-10-25 LAB — HEMOGLOBIN A1C
HEMOGLOBIN A1C: 13.9 % — AB (ref <5.7)
Mean Plasma Glucose: 352 mg/dL — ABNORMAL HIGH (ref <117)

## 2013-10-29 ENCOUNTER — Ambulatory Visit (INDEPENDENT_AMBULATORY_CARE_PROVIDER_SITE_OTHER): Payer: Commercial Managed Care - HMO | Admitting: Family Medicine

## 2013-10-29 ENCOUNTER — Encounter: Payer: Self-pay | Admitting: Family Medicine

## 2013-10-29 VITALS — BP 136/80 | HR 68 | Temp 97.2°F | Resp 18 | Ht 65.0 in | Wt 196.0 lb

## 2013-10-29 DIAGNOSIS — IMO0001 Reserved for inherently not codable concepts without codable children: Secondary | ICD-10-CM

## 2013-10-29 DIAGNOSIS — E1165 Type 2 diabetes mellitus with hyperglycemia: Principal | ICD-10-CM

## 2013-10-29 MED ORDER — LEVOTHYROXINE SODIUM 100 MCG PO TABS: 100.0000 ug | ORAL_TABLET | Freq: Every day | ORAL | Status: DC

## 2013-10-29 MED ORDER — INSULIN ASPART 100 UNIT/ML ~~LOC~~ SOLN: 15.0000 [IU] | Freq: Three times a day (TID) | SUBCUTANEOUS | Status: DC

## 2013-10-29 MED ORDER — INSULIN GLARGINE 100 UNIT/ML ~~LOC~~ SOLN: 80.0000 [IU] | Freq: Every day | SUBCUTANEOUS | Status: DC

## 2013-10-29 MED ORDER — METFORMIN HCL 850 MG PO TABS: 850.0000 mg | ORAL_TABLET | Freq: Two times a day (BID) | ORAL | Status: DC

## 2013-10-29 MED ORDER — LOSARTAN POTASSIUM-HCTZ 50-12.5 MG PO TABS: 1.0000 | ORAL_TABLET | Freq: Every day | ORAL | Status: DC

## 2013-10-29 NOTE — Progress Notes (Signed)
Subjective:    Patient ID: [name], female    DOB: 12-05-1950, 63 y.o.   MRN: [id number]  HPI Patient begins crying as soon as I walk into the exam room.  She is not taking any of her medications in almost one year. She is not taking any insulin or pills for diabetes. She is now taking blood pressure medication. She's not taking cholesterol medication. Her most recent lab work is listed below: Appointment on 10/25/2013  Component Date Value Ref Range Status  . WBC 10/25/2013 6.8  4.0 - 10.5 K/uL Final  . RBC 10/25/2013 4.91  3.87 - 5.11 MIL/uL Final  . Hemoglobin 10/25/2013 14.5  12.0 - 15.0 g/dL Final  . HCT 10/25/2013 43.3  36.0 - 46.0 % Final  . MCV 10/25/2013 88.2  78.0 - 100.0 fL Final  . MCH 10/25/2013 29.5  26.0 - 34.0 pg Final  . MCHC 10/25/2013 33.5  30.0 - 36.0 g/dL Final  . RDW 10/25/2013 14.6  11.5 - 15.5 % Final  . Platelets 10/25/2013 235  150 - 400 K/uL Final  . Neutrophils Relative % 10/25/2013 46  43 - 77 % Final  . Neutro Abs 10/25/2013 3.1  1.7 - 7.7 K/uL Final  . Lymphocytes Relative 10/25/2013 41  12 - 46 % Final  . Lymphs Abs 10/25/2013 2.8  0.7 - 4.0 K/uL Final  . Monocytes Relative 10/25/2013 8  3 - 12 % Final  . Monocytes Absolute 10/25/2013 0.5  0.1 - 1.0 K/uL Final  . Eosinophils Relative 10/25/2013 4  0 - 5 % Final  . Eosinophils Absolute 10/25/2013 0.3  0.0 - 0.7 K/uL Final  . Basophils Relative 10/25/2013 1  0 - 1 % Final  . Basophils Absolute 10/25/2013 0.1  0.0 - 0.1 K/uL Final  . Smear Review 10/25/2013 Criteria for review not met   Final  . Sodium 10/25/2013 138  135 - 145 mEq/L Final  . Potassium 10/25/2013 4.6  3.5 - 5.3 mEq/L Final  . Chloride 10/25/2013 103  96 - 112 mEq/L Final  . CO2 10/25/2013 27  19 - 32 mEq/L Final  . Glucose, Bld 10/25/2013 313* 70 - 99 mg/dL Final  . BUN 10/25/2013 16  6 - 23 mg/dL Final  . Creat 10/25/2013 0.71  0.50 - 1.10 mg/dL Final  . Total Bilirubin 10/25/2013 0.4  0.2 - 1.2 mg/dL Final  . Alkaline  Phosphatase 10/25/2013 65  39 - 117 U/L Final  . AST 10/25/2013 14  0 - 37 U/L Final  . ALT 10/25/2013 17  0 - 35 U/L Final  . Total Protein 10/25/2013 6.9  6.0 - 8.3 g/dL Final  . Albumin 10/25/2013 3.5  3.5 - 5.2 g/dL Final  . Calcium 10/25/2013 9.8  8.4 - 10.5 mg/dL Final  . Hemoglobin A1C 10/25/2013 13.9* <5.7 % Final   Comment:                                                                                                 According to the ADA Clinical Practice Recommendations for 2011, when  HbA1c is used as a screening test:                                                       >=6.5%   Diagnostic of Diabetes Mellitus                                     (if abnormal result is confirmed)                                                     5.7-6.4%   Increased risk of developing Diabetes Mellitus                                                     References:Diagnosis and Classification of Diabetes Mellitus,Diabetes                          [id number],[id number],82(NFAOZ 1):S62-S69 and Standards of Medical Care in                                  Diabetes - 2011,Diabetes [id number],5784,69 (Suppl 1):S11-S61.                             . Mean Plasma Glucose 10/25/2013 352* <117 mg/dL Final  . Cholesterol 10/25/2013 179  0 - 200 mg/dL Final   Comment: ATP III Classification:                                < 200        mg/dL        Desirable                               200 - 239     mg/dL        Borderline High                               >= 240        mg/dL        High                             . Triglycerides 10/25/2013 187* <150 mg/dL Final  . HDL 10/25/2013 48  >39 mg/dL Final  . Total CHOL/HDL Ratio 10/25/2013 3.7   Final  . VLDL 10/25/2013 37  0 - 40 mg/dL Final  . LDL Cholesterol 10/25/2013 94  0 - 99 mg/dL Final   Comment:  Total Cholesterol/HDL Ratio:CHD Risk                                                 Coronary Heart Disease Risk  Table                                                                 Men       Women                                   1/2 Average Risk              3.4        3.3                                       Average Risk              5.0        4.4                                    2X Average Risk              9.6        7.1                                    3X Average Risk             23.4       11.0                          Use the calculated Patient Ratio above and the CHD Risk table                           to determine the patient's CHD Risk.                          ATP III Classification (LDL):                                < 100        mg/dL         Optimal                               100 - 129     mg/dL         Near or Above Optimal                               130 - 159  mg/dL         Borderline High                               160 - 189     mg/dL         High                                > 190        mg/dL         Very High                              She is extremely tearful and depressed. She is not sleeping. She is trouble concentrating. She reports depression and anhedonia. This all stems with caring for her parents and her chronic medical problems. She is also having an great of difficulty getting her siblings to help her care for her parents. She does not want to take any medication for depression. However the depression coupled with her chronic low back pain and right-sided sciatica and residual disability have contributed to her medical noncompliance. She states that in some ways she has given up taking care of herself. Past Medical History  Diagnosis Date  . GERD (gastroesophageal reflux disease)   . Hypothyroidism   . Peripheral neuropathy   . Cardiomyopathy   . Chest pain 06/2005    Hospitalized, dystolic dysfunction  . Sciatica   . Type II diabetes mellitus   . PONV (postoperative nausea and vomiting)   . H/O hiatal hernia   . Migraines     "it's been a  long time since I've had one" (03/20/2012)  . Osteoarthritis   . Arthritis     "all over; mostly in my back" (03/20/2012)  . Chronic lower back pain     "have 2 herniated discs; going to have to have a fusion" (03/20/2012)  . Kidney stones     "never had OR" (03/20/2012)  . Breast cancer     "right" (03/20/2012)  . Family history of anesthesia complication     "father has nausea and vomiting"  . Anginal pain 03/19/2012    saw Dr. Cathie Olden  . Irritable bowel syndrome   . Hypertension     "patient states never had HTN, takes Hyzaar for heart   Current Outpatient Prescriptions on File Prior to Visit  Medication Sig Dispense Refill  . aspirin 81 MG tablet Take 81 mg by mouth daily.      . Cholecalciferol (VITAMIN D) 2000 UNITS CAPS Take 4,000 Units by mouth daily.      [name] Kitchen docusate sodium 100 MG CAPS Take 100 mg by mouth 2 (two) times daily.  10 capsule    . fluticasone (FLONASE) 50 MCG/ACT nasal spray Place 2 sprays into the nose daily.  48 g  1  . LYRICA 150 MG capsule TAKE ONE CAPSULE BY MOUTH TWICE DAILY   60 capsule  1  . nitroGLYCERIN (NITROSTAT) 0.4 MG SL tablet Place 0.4 mg under the tongue every 5 (five) minutes as needed. Chest pain      . oxyCODONE-acetaminophen (PERCOCET/ROXICET) 5-325 MG per tablet Take 1 tablet by mouth every 4 (four) hours as needed. For pain      . insulin aspart (NOVOLOG) 100 UNIT/ML injection Inject 15 Units into the skin 3 (  three) times daily before meals.      . insulin glargine (LANTUS) 100 UNIT/ML injection Inject 80 Units into the skin at bedtime.       [name] Kitchen levothyroxine (SYNTHROID, LEVOTHROID) 100 MCG tablet Take 1 tablet (100 mcg total) by mouth daily.  90 tablet  1  . losartan-hydrochlorothiazide (HYZAAR) 50-12.5 MG per tablet Take 1 tablet by mouth daily.  90 tablet  1  . metFORMIN (GLUCOPHAGE) 850 MG tablet Take 850 mg by mouth 2 (two) times daily with a meal.        No current facility-administered medications on file prior to visit.   Allergies    Allergen Reactions  . Dilaudid [Hydromorphone Hcl] Nausea And Vomiting  . Penicillins   . Ampicillin Rash    REACTION: Rash and welts on her hands when she took it in the hospital  . Latex Itching, Dermatitis and Rash    REACTION: Patient had a reaction to tape after surgery and they told her she was allergic to Latex 03/20/2012 "if it's on too long it will break me out"   History   Social History  . Marital Status: Married    Spouse Name: N/A    Number of Children: 1  . Years of Education: N/A   Occupational History  . Retired since 2003    Social History Main Topics  . Smoking status: Never Smoker   . Smokeless tobacco: Never Used  . Alcohol Use: No  . Drug Use: No  . Sexual Activity: Not Currently   Other Topics Concern  . Not on file   Social History Narrative  . No narrative on file      Review of Systems  All other systems reviewed and are negative.      Objective:   Physical Exam  Vitals reviewed. Cardiovascular: Normal rate, regular rhythm and normal heart sounds.   Pulmonary/Chest: Effort normal and breath sounds normal. No respiratory distress. She has no wheezes. She has no rales.  Abdominal: Soft. Bowel sounds are normal.  Psychiatric: She exhibits a depressed mood.          Assessment & Plan:  1. Type II or unspecified type diabetes mellitus without mention of complication, uncontrolled At this point, her most pressing medical needed to get control of her diabetes..  To do this we need to control her depression. The patient medication for depression. She is resistant to medication. She is not suicidal. We discussed strategies to help reduce her stress. Also recommended that she have a family meeting to help delegate responsibilities to her siblings. Also recommended that she confided in her husband her fears and concerns to help her manage her stress by having a confidant.  Initially I want patient to resume metformin 850 mg by mouth twice a day.  I want her to start back on her Lantus albeit at a lower dose. Reduce 80 units to 40 units once daily.  Recheck fasting blood sugars and two-hour postprandial sugars in one week and titrate up on her insulin as necessary. Otherwise resume her previous medications. Recheck in one week.

## 2013-10-29 NOTE — Addendum Note (Signed)
Addended by: Shary Decamp B on: 10/29/2013 04:02 PM   Modules accepted: Orders

## 2013-11-08 ENCOUNTER — Telehealth: Payer: Self-pay | Admitting: Family Medicine

## 2013-11-08 MED ORDER — OXYCODONE-ACETAMINOPHEN 5-325 MG PO TABS: 1.0000 | ORAL_TABLET | ORAL | Status: DC | PRN

## 2013-11-08 NOTE — Telephone Encounter (Signed)
The pt has been taking Lantus 40 units QHS and her FBS's are 233,242,241,253,256,204,224. She did take 80 units one night and her FBS was 182. She is taking Metformin 850mg  bid and she takes Novolog 17-18 units qd-bid depending on when she eats as she sleeps in late and sometimes gets busy at night and eats a late dinner. Pt also requesting a refill on her Oxycodone for her back and leg pain.  Per [facility] Increase Lantus to 80 units qhs and check FBS - if FBS is above 130 increase Lantus by 1 units each day until FBS is below 130. Continue Novolog as she is doing now. Call back in a week with FBS readings. Ok to refill Oxycodone.  Pt is aware of all above - rx printed and left up front.

## 2013-11-25 ENCOUNTER — Telehealth: Payer: Self-pay | Admitting: Family Medicine

## 2013-11-25 MED ORDER — PREGABALIN 150 MG PO CAPS: ORAL_CAPSULE | ORAL | Status: DC

## 2013-11-25 MED ORDER — OXYCODONE-ACETAMINOPHEN 5-325 MG PO TABS: 1.0000 | ORAL_TABLET | ORAL | Status: DC | PRN

## 2013-11-25 NOTE — Telephone Encounter (Signed)
Also ok to refill her oxycodone?

## 2013-11-25 NOTE — Telephone Encounter (Signed)
Ok to refill, were these fasting sugars?

## 2013-11-25 NOTE — Telephone Encounter (Signed)
Patient is calling in her sugar numbers [phone]  All of these were last week  Also needs her lyrica  and oxycodone  [phone]

## 2013-11-25 NOTE — Telephone Encounter (Signed)
RX printed, left up front and patient aware to pick up and these were fasting BS

## 2013-11-26 NOTE — Telephone Encounter (Signed)
fastings are too high.  Increase Lantus to 50 units qday and recheck fasting blood sugars and two-hour postprandial sugars in one week and titrate up on her insulin as necessary.

## 2013-11-26 NOTE — Telephone Encounter (Signed)
Pt was taking 80 units qam as we increased it in a phone note. Per [facility] increase Lantus to 45 units BID and call us back in 1 week with FBS and 2hr pp. Pt aware and verbalizes understanding.

## 2013-11-29 ENCOUNTER — Encounter (HOSPITAL_BASED_OUTPATIENT_CLINIC_OR_DEPARTMENT_OTHER): Payer: Self-pay | Admitting: Emergency Medicine

## 2013-11-29 ENCOUNTER — Emergency Department (HOSPITAL_BASED_OUTPATIENT_CLINIC_OR_DEPARTMENT_OTHER)
Admission: EM | Admit: 2013-11-29 | Discharge: 2013-11-29 | Disposition: A | Discharge status: Home / Self Care | Payer: Medicare HMO | Attending: Emergency Medicine | Admitting: Emergency Medicine

## 2013-11-29 ENCOUNTER — Emergency Department (HOSPITAL_BASED_OUTPATIENT_CLINIC_OR_DEPARTMENT_OTHER): Payer: Medicare HMO

## 2013-11-29 DIAGNOSIS — G43909 Migraine, unspecified, not intractable, without status migrainosus: Secondary | ICD-10-CM | POA: Diagnosis not present

## 2013-11-29 DIAGNOSIS — Z7982 Long term (current) use of aspirin: Secondary | ICD-10-CM | POA: Insufficient documentation

## 2013-11-29 DIAGNOSIS — Z853 Personal history of malignant neoplasm of breast: Secondary | ICD-10-CM | POA: Diagnosis not present

## 2013-11-29 DIAGNOSIS — Z87442 Personal history of urinary calculi: Secondary | ICD-10-CM | POA: Diagnosis not present

## 2013-11-29 DIAGNOSIS — M79609 Pain in unspecified limb: Secondary | ICD-10-CM | POA: Diagnosis present

## 2013-11-29 DIAGNOSIS — IMO0002 Reserved for concepts with insufficient information to code with codable children: Secondary | ICD-10-CM | POA: Diagnosis not present

## 2013-11-29 DIAGNOSIS — E119 Type 2 diabetes mellitus without complications: Secondary | ICD-10-CM | POA: Diagnosis not present

## 2013-11-29 DIAGNOSIS — Z91040 Latex allergy status: Secondary | ICD-10-CM | POA: Insufficient documentation

## 2013-11-29 DIAGNOSIS — I10 Essential (primary) hypertension: Secondary | ICD-10-CM | POA: Insufficient documentation

## 2013-11-29 DIAGNOSIS — Z794 Long term (current) use of insulin: Secondary | ICD-10-CM | POA: Insufficient documentation

## 2013-11-29 DIAGNOSIS — M5430 Sciatica, unspecified side: Secondary | ICD-10-CM | POA: Diagnosis not present

## 2013-11-29 DIAGNOSIS — M1990 Unspecified osteoarthritis, unspecified site: Secondary | ICD-10-CM | POA: Diagnosis not present

## 2013-11-29 DIAGNOSIS — Z880 Allergy status to penicillin: Secondary | ICD-10-CM | POA: Diagnosis not present

## 2013-11-29 DIAGNOSIS — E039 Hypothyroidism, unspecified: Secondary | ICD-10-CM | POA: Insufficient documentation

## 2013-11-29 DIAGNOSIS — M5431 Sciatica, right side: Secondary | ICD-10-CM

## 2013-11-29 DIAGNOSIS — Z8719 Personal history of other diseases of the digestive system: Secondary | ICD-10-CM | POA: Diagnosis not present

## 2013-11-29 DIAGNOSIS — G8929 Other chronic pain: Secondary | ICD-10-CM | POA: Diagnosis not present

## 2013-11-29 DIAGNOSIS — Z79899 Other long term (current) drug therapy: Secondary | ICD-10-CM | POA: Insufficient documentation

## 2013-11-29 MED ORDER — MORPHINE SULFATE 4 MG/ML IJ SOLN
6.0000 mg | Freq: Once | INTRAMUSCULAR | Status: AC
  Administered 2013-11-29: 6 mg via INTRAMUSCULAR
  Filled 2013-11-29: qty 2

## 2013-11-29 MED ORDER — OXYCODONE-ACETAMINOPHEN 5-325 MG PO TABS: 1.0000 | ORAL_TABLET | Freq: Four times a day (QID) | ORAL | Status: DC | PRN

## 2013-11-29 MED ORDER — ONDANSETRON 4 MG PO TBDP
4.0000 mg | ORAL_TABLET | Freq: Once | ORAL | Status: AC
  Administered 2013-11-29: 4 mg via ORAL
  Filled 2013-11-29: qty 1

## 2013-11-29 MED ORDER — DIAZEPAM 5 MG PO TABS
5.0000 mg | ORAL_TABLET | Freq: Once | ORAL | Status: AC
  Administered 2013-11-29: 5 mg via ORAL
  Filled 2013-11-29: qty 1

## 2013-11-29 MED ORDER — DIAZEPAM 5 MG PO TABS: 5.0000 mg | ORAL_TABLET | Freq: Four times a day (QID) | ORAL | Status: DC | PRN

## 2013-11-29 NOTE — ED Notes (Signed)
Pain to right hip down right leg for years-worse x 2 weeks with increase pain with weigh bearing-denies injury

## 2013-11-29 NOTE — Discharge Instructions (Signed)
Back Exercises Back exercises help treat and prevent back injuries. The goal of back exercises is to increase the strength of your abdominal and back muscles and the flexibility of your back. These exercises should be started when you no longer have back pain. Back exercises include:  Pelvic Tilt. Lie on your back with your knees bent. Tilt your pelvis until the lower part of your back is against the floor. Hold this position 5 to 10 sec and repeat 5 to 10 times.  Knee to Chest. Pull first 1 knee up against your chest and hold for 20 to 30 seconds, repeat this with the other knee, and then both knees. This may be done with the other leg straight or bent, whichever feels better.  Sit-Ups or Curl-Ups. Bend your knees 90 degrees. Start with tilting your pelvis, and do a partial, slow sit-up, lifting your trunk only 30 to 45 degrees off the floor. Take at least 2 to 3 seconds for each sit-up. Do not do sit-ups with your knees out straight. If partial sit-ups are difficult, simply do the above but with only tightening your abdominal muscles and holding it as directed.  Hip-Lift. Lie on your back with your knees flexed 90 degrees. Push down with your feet and shoulders as you raise your hips a couple inches off the floor; hold for 10 seconds, repeat 5 to 10 times.  Back arches. Lie on your stomach, propping yourself up on bent elbows. Slowly press on your hands, causing an arch in your low back. Repeat 3 to 5 times. Any initial stiffness and discomfort should lessen with repetition over time.  Shoulder-Lifts. Lie face down with arms beside your body. Keep hips and torso pressed to floor as you slowly lift your head and shoulders off the floor. Do not overdo your exercises, especially in the beginning. Exercises may cause you some mild back discomfort which lasts for a few minutes; however, if the pain is more severe, or lasts for more than 15 minutes, do not continue exercises until you see your caregiver.  Improvement with exercise therapy for back problems is slow.  See your caregivers for assistance with developing a proper back exercise program. Document Released: 05/05/2004 Document Revised: 06/20/2011 Document Reviewed: 01/27/2011 Exit[employer] Patient Information 2015 [employer]. This information is not intended to replace advice given to you by your health care provider. Make sure you discuss any questions you have with your health care provider.   Sciatica Sciatica is pain, weakness, numbness, or tingling along the path of the sciatic nerve. The nerve starts in the lower back and runs down the back of each leg. The nerve controls the muscles in the lower leg and in the back of the knee, while also providing sensation to the back of the thigh, lower leg, and the sole of your foot. Sciatica is a symptom of another medical condition. For instance, nerve damage or certain conditions, such as a herniated disk or bone spur on the spine, pinch or put pressure on the sciatic nerve. This causes the pain, weakness, or other sensations normally associated with sciatica. Generally, sciatica only affects one side of the body. CAUSES   Herniated or slipped disc.  Degenerative disk disease.  A pain disorder involving the narrow muscle in the buttocks (piriformis syndrome).  Pelvic injury or fracture.  Pregnancy.  Tumor (rare). SYMPTOMS  Symptoms can vary from mild to very severe. The symptoms usually travel from the low back to the buttocks and down the back   of the leg. Symptoms can include:  Mild tingling or dull aches in the lower back, leg, or hip.  Numbness in the back of the calf or sole of the foot.  Burning sensations in the lower back, leg, or hip.  Sharp pains in the lower back, leg, or hip.  Leg weakness.  Severe back pain inhibiting movement. These symptoms may get worse with coughing, sneezing, laughing, or prolonged sitting or standing. Also, being overweight may worsen  symptoms. DIAGNOSIS  Your caregiver will perform a physical exam to look for common symptoms of sciatica. He or she may ask you to do certain movements or activities that would trigger sciatic nerve pain. Other tests may be performed to find the cause of the sciatica. These may include:  Blood tests.  X-rays.  Imaging tests, such as an MRI or CT scan. TREATMENT  Treatment is directed at the cause of the sciatic pain. Sometimes, treatment is not necessary and the pain and discomfort goes away on its own. If treatment is needed, your caregiver may suggest:  Over-the-counter medicines to relieve pain.  Prescription medicines, such as anti-inflammatory medicine, muscle relaxants, or narcotics.  Applying heat or ice to the painful area.  Steroid injections to lessen pain, irritation, and inflammation around the nerve.  Reducing activity during periods of pain.  Exercising and stretching to strengthen your abdomen and improve flexibility of your spine. Your caregiver may suggest losing weight if the extra weight makes the back pain worse.  Physical therapy.  Surgery to eliminate what is pressing or pinching the nerve, such as a bone spur or part of a herniated disk. HOME CARE INSTRUCTIONS   Only take over-the-counter or prescription medicines for pain or discomfort as directed by your caregiver.  Apply ice to the affected area for 20 minutes, 3-4 times a day for the first 48-72 hours. Then try heat in the same way.  Exercise, stretch, or perform your usual activities if these do not aggravate your pain.  Attend physical therapy sessions as directed by your caregiver.  Keep all follow-up appointments as directed by your caregiver.  Do not wear high heels or shoes that do not provide proper support.  Check your mattress to see if it is too soft. A firm mattress may lessen your pain and discomfort. SEEK IMMEDIATE MEDICAL CARE IF:   You lose control of your bowel or bladder  (incontinence).  You have increasing weakness in the lower back, pelvis, buttocks, or legs.  You have redness or swelling of your back.  You have a burning sensation when you urinate.  You have pain that gets worse when you lie down or awakens you at night.  Your pain is worse than you have experienced in the past.  Your pain is lasting longer than 4 weeks.  You are suddenly losing weight without reason. MAKE SURE YOU:  Understand these instructions.  Will watch your condition.  Will get help right away if you are not doing well or get worse. Document Released: 03/22/2001 Document Revised: 09/27/2011 Document Reviewed: 08/07/2011 Exit[employer] Patient Information 2015 [employer]. This information is not intended to replace advice given to you by your health care provider. Make sure you discuss any questions you have with your health care provider.  

## 2013-11-29 NOTE — ED Provider Notes (Signed)
CSN: [id number]     Arrival date & time 11/29/13  1511 History   First MD Initiated Contact with Patient 11/29/13 1519     Chief Complaint  Patient presents with  . Extremity Pain     (Consider location/radiation/quality/duration/timing/severity/associated sxs/prior Treatment) Patient is a 63 y.o. female presenting with extremity pain. The history is provided by the patient.  Extremity Pain This is a new problem. The current episode started more than 1 week ago. The problem occurs constantly. The problem has been gradually worsening. Pertinent negatives include no chest pain, no abdominal pain and no shortness of breath. The symptoms are aggravated by twisting, bending and walking. Nothing relieves the symptoms. Treatments tried: Oxycodone. The treatment provided no relief.    Past Medical History  Diagnosis Date  . GERD (gastroesophageal reflux disease)   . Hypothyroidism   . Peripheral neuropathy   . Cardiomyopathy   . Chest pain 06/2005    Hospitalized, dystolic dysfunction  . Sciatica   . Type II diabetes mellitus   . PONV (postoperative nausea and vomiting)   . H/O hiatal hernia   . Migraines     "it's been a long time since I've had one" (03/20/2012)  . Osteoarthritis   . Arthritis     "all over; mostly in my back" (03/20/2012)  . Chronic lower back pain     "have 2 herniated discs; going to have to have a fusion" (03/20/2012)  . Kidney stones     "never had OR" (03/20/2012)  . Breast cancer     "right" (03/20/2012)  . Family history of anesthesia complication     "father has nausea and vomiting"  . Anginal pain 03/19/2012    saw Dr. Cathie Olden  . Irritable bowel syndrome   . Hypertension     "patient states never had HTN, takes Hyzaar for heart   Past Surgical History  Procedure Laterality Date  . Abdominal hysterectomy  1993  . Total knee arthroplasty  2000    Right  . Mastectomy with axillary lymph node dissection  12/2003    "right" (03/20/2012)  .  Thyroidectomy  1977    Right  . Posterior cervical fusion/foraminotomy  2001  . Shoulder arthroscopy w/ rotator cuff repair  2003    Left  . Cardiac catheterization  ? 2005 / 2007  . Adenosin cardiolite  07/2005    (+) wall motion abnormality  . Esophagogastroduodenoscopy  2005    GERD  . [place] of abdomen  07/2005    Fatty liver / kidney stones  . Ct abdomen and pelvis  02/2010    Same  . Knee arthroscopy  1990's    "right" (03/20/2012)  . Axillary lymph node dissection  2003    squamous cell cancer  . Back surgery     Family History  Problem Relation Age of Onset  . Hypertension Mother   . Diabetes Father   . Hypertension Father   . Hypertension Sister   . Diabetes Other     Family Hx. of Diabetes  . Hypertension Other     Family History of HTN  . Hypertension Brother   . Hypertension Brother   . Diabetes Brother    History  Substance Use Topics  . Smoking status: Never Smoker   . Smokeless tobacco: Never Used  . Alcohol Use: No   OB History   Grav Para Term Preterm Abortions TAB SAB Ect Mult Living  Review of Systems  Constitutional: Negative for fever and chills.  Respiratory: Negative for cough and shortness of breath.   Cardiovascular: Negative for chest pain and leg swelling.  Gastrointestinal: Negative for vomiting and abdominal pain.  All other systems reviewed and are negative.     Allergies  Dilaudid; Penicillins; Ampicillin; and Latex  Home Medications   Prior to Admission medications   Medication Sig Start Date End Date Taking? Authorizing Provider  aspirin 81 MG tablet Take 81 mg by mouth daily.    Historical Provider, MD  Cholecalciferol (VITAMIN D) 2000 UNITS CAPS Take 4,000 Units by mouth daily.    Historical Provider, MD  docusate sodium 100 MG CAPS Take 100 mg by mouth 2 (two) times daily. 05/04/12   Epimenio Foot, PA-C  fluticasone (FLONASE) 50 MCG/ACT nasal spray Place 2 sprays into the nose daily. 09/20/12   Susy Frizzle, MD  insulin aspart (NOVOLOG) 100 UNIT/ML injection Inject 15 Units into the skin 3 (three) times daily before meals. 10/29/13   Susy Frizzle, MD  insulin glargine (LANTUS) 100 UNIT/ML injection Inject 45 Units into the skin 2 (two) times daily. 10/29/13   Susy Frizzle, MD  levothyroxine (SYNTHROID, LEVOTHROID) 100 MCG tablet Take 1 tablet (100 mcg total) by mouth daily. 10/29/13   Susy Frizzle, MD  losartan-hydrochlorothiazide (HYZAAR) 50-12.5 MG per tablet Take 1 tablet by mouth daily. 10/29/13   Susy Frizzle, MD  metFORMIN (GLUCOPHAGE) 850 MG tablet Take 1 tablet (850 mg total) by mouth 2 (two) times daily with a meal. 10/29/13   Susy Frizzle, MD  nitroGLYCERIN (NITROSTAT) 0.4 MG SL tablet Place 0.4 mg under the tongue every 5 (five) minutes as needed. Chest pain    Historical Provider, MD  oxyCODONE-acetaminophen (PERCOCET/ROXICET) 5-325 MG per tablet Take 1 tablet by mouth every 4 (four) hours as needed. For pain 11/25/13   Susy Frizzle, MD  pregabalin (LYRICA) 150 MG capsule TAKE ONE CAPSULE BY MOUTH TWICE DAILY 11/25/13   Susy Frizzle, MD   BP 177/81  Pulse 73  Temp(Src) 97.9 F (36.6 C) (Oral)  Resp 16  Ht 5\' 5"  (1.651 m)  Wt 200 lb (90.719 kg)  BMI 33.28 kg/m2  SpO2 97% Physical Exam  Nursing note and vitals reviewed. Constitutional: She is oriented to person, place, and time. She appears well-developed and well-nourished. No distress.  HENT:  Head: Normocephalic and atraumatic.  Mouth/Throat: Oropharynx is clear and moist.  Eyes: EOM are normal. Pupils are equal, round, and reactive to light.  Neck: Normal range of motion. Neck supple.  Cardiovascular: Normal rate and regular rhythm.  Exam reveals no friction rub.   No murmur heard. Pulmonary/Chest: Effort normal and breath sounds normal. No respiratory distress. She has no wheezes. She has no rales.  Abdominal: Soft. She exhibits no distension. There is no tenderness. There is no rebound.   Musculoskeletal: Normal range of motion. She exhibits no edema.       Lumbar back: She exhibits tenderness and spasm (R lumbar region). She exhibits no bony tenderness, no swelling and no edema.  Neurological: She is alert and oriented to person, place, and time. No cranial nerve deficit. She exhibits normal muscle tone. Coordination normal.  Skin: No rash noted. She is not diaphoretic.    ED Course  Procedures (including critical care time) Labs Review Labs Reviewed - No data to display  Imaging Review Dg Lumbar Spine Complete  11/29/2013   CLINICAL DATA:  Low back pain with radicular symptoms of pain and numbness in the right lower extremity.  EXAM: LUMBAR SPINE - COMPLETE 4+ VIEW  COMPARISON:  Intraoperative films on 04/30/2012 as well as abdominal film on 05/02/2012.  FINDINGS: Posterior lumbar fusion hardware at the L4-5 and L5-S1 levels appears stable with no evidence of hardware failure or abnormal lucency surrounding hardware. Mild residual anterolisthesis of L4 on L5 noted of approximately 4-5 mm. No evidence of acute fracture. Mild degenerative changes are present at L3-4.  IMPRESSION: Stable appearance post PLIF at L4-5 and L5-S1. Mild anterolisthesis present of L4 on L5.   Electronically Signed   By: Aletta Edouard M.D.   On: 11/29/2013 16:41   Dg Pelvis 1-2 Views  11/29/2013   CLINICAL DATA:  Low back pain, pain and numbness going down RIGHT leg for 2 weeks, RIGHT hip pain, no injury  EXAM: PELVIS - 1-2 VIEW  COMPARISON:  02/11/2007  FINDINGS: Interval L4-S1 fusion.  Osseous demineralization.  Minimal narrowing of the hip joints bilaterally.  SI joints preserved.  No acute fracture, dislocation, or bone destruction.  IMPRESSION: Interval L4-S1 fusion.  No acute abnormalities.   Electronically Signed   By: Lavonia Dana M.D.   On: 11/29/2013 16:35   Dg Knee Complete 4 Views Right  11/29/2013   CLINICAL DATA:  Right knee pain  EXAM: RIGHT KNEE - COMPLETE 4+ VIEW  COMPARISON:  None.   FINDINGS: Total knee arthroplasty anatomically aligned. No breakage or loosening of the hardware. No evidence of fracture or dislocation.  IMPRESSION: Total knee arthroplasty anatomically aligned. No acute bony pathology.   Electronically Signed   By: Maryclare Bean M.D.   On: 11/29/2013 16:33     EKG Interpretation None      MDM   Final diagnoses:  Right sided sciatica    62F here with right leg pain. Has been having chronic right leg pain since her lumbar surgery 2 years ago, but has been worse over the past 2 weeks. Denies any fevers, saddle anesthesia, urinary incontinence or retention, decreased strength. She has some numbness and tingling, but has normal sensory exam here. Normal strength and lower chimney's. Muscle spasm noted in her lumbar region. She has oxycodone, but tries not to take and it does not help when she does. Here with stable vitals. No concerns for cord compression based on story. Does have cancer history, will xray her back, knee, hip.  Xrays ok. Feeling some better after pain meds. Advised to f/u with Spine Surgeon and PCP next week.   Evelina Bucy, MD 11/29/13 [phone]

## 2013-11-30 ENCOUNTER — Emergency Department (HOSPITAL_BASED_OUTPATIENT_CLINIC_OR_DEPARTMENT_OTHER)
Admission: EM | Admit: 2013-11-30 | Discharge: 2013-12-01 | Disposition: A | Discharge status: Home / Self Care | Payer: Medicare HMO | Attending: Emergency Medicine | Admitting: Emergency Medicine

## 2013-11-30 ENCOUNTER — Encounter (HOSPITAL_BASED_OUTPATIENT_CLINIC_OR_DEPARTMENT_OTHER): Payer: Self-pay | Admitting: Emergency Medicine

## 2013-11-30 DIAGNOSIS — Z79899 Other long term (current) drug therapy: Secondary | ICD-10-CM | POA: Insufficient documentation

## 2013-11-30 DIAGNOSIS — K219 Gastro-esophageal reflux disease without esophagitis: Secondary | ICD-10-CM | POA: Diagnosis not present

## 2013-11-30 DIAGNOSIS — M545 Low back pain, unspecified: Secondary | ICD-10-CM | POA: Diagnosis present

## 2013-11-30 DIAGNOSIS — N390 Urinary tract infection, site not specified: Secondary | ICD-10-CM | POA: Insufficient documentation

## 2013-11-30 DIAGNOSIS — G43909 Migraine, unspecified, not intractable, without status migrainosus: Secondary | ICD-10-CM | POA: Insufficient documentation

## 2013-11-30 DIAGNOSIS — E039 Hypothyroidism, unspecified: Secondary | ICD-10-CM | POA: Insufficient documentation

## 2013-11-30 DIAGNOSIS — Z853 Personal history of malignant neoplasm of breast: Secondary | ICD-10-CM | POA: Insufficient documentation

## 2013-11-30 DIAGNOSIS — Z794 Long term (current) use of insulin: Secondary | ICD-10-CM | POA: Insufficient documentation

## 2013-11-30 DIAGNOSIS — Z7982 Long term (current) use of aspirin: Secondary | ICD-10-CM | POA: Insufficient documentation

## 2013-11-30 DIAGNOSIS — M62830 Muscle spasm of back: Secondary | ICD-10-CM

## 2013-11-30 DIAGNOSIS — I10 Essential (primary) hypertension: Secondary | ICD-10-CM | POA: Insufficient documentation

## 2013-11-30 DIAGNOSIS — Z87442 Personal history of urinary calculi: Secondary | ICD-10-CM | POA: Insufficient documentation

## 2013-11-30 DIAGNOSIS — E119 Type 2 diabetes mellitus without complications: Secondary | ICD-10-CM | POA: Insufficient documentation

## 2013-11-30 DIAGNOSIS — Z91040 Latex allergy status: Secondary | ICD-10-CM | POA: Insufficient documentation

## 2013-11-30 DIAGNOSIS — G8929 Other chronic pain: Secondary | ICD-10-CM | POA: Insufficient documentation

## 2013-11-30 DIAGNOSIS — M1990 Unspecified osteoarthritis, unspecified site: Secondary | ICD-10-CM | POA: Diagnosis not present

## 2013-11-30 DIAGNOSIS — IMO0002 Reserved for concepts with insufficient information to code with codable children: Secondary | ICD-10-CM | POA: Insufficient documentation

## 2013-11-30 DIAGNOSIS — M129 Arthropathy, unspecified: Secondary | ICD-10-CM | POA: Insufficient documentation

## 2013-11-30 DIAGNOSIS — Z880 Allergy status to penicillin: Secondary | ICD-10-CM | POA: Diagnosis not present

## 2013-11-30 MED ORDER — DEXAMETHASONE SODIUM PHOSPHATE 10 MG/ML IJ SOLN: 10.0000 mg | Freq: Once | INTRAMUSCULAR | Status: AC

## 2013-11-30 MED ORDER — KETOROLAC TROMETHAMINE 30 MG/ML IJ SOLN
30.0000 mg | Freq: Once | INTRAMUSCULAR | Status: AC
  Administered 2013-12-01: 30 mg via INTRAVENOUS
  Filled 2013-11-30: qty 1

## 2013-11-30 MED ORDER — METHOCARBAMOL 1000 MG/10ML IJ SOLN
1000.0000 mg | Freq: Once | INTRAMUSCULAR | Status: AC
  Administered 2013-12-01: 1000 mg via INTRAVENOUS
  Filled 2013-11-30: qty 10

## 2013-11-30 NOTE — ED Provider Notes (Signed)
CSN: [id number]     Arrival date & time 11/30/13  2237 History   This chart was scribed for Travon Crochet Alfonso Patten, MD, by Neta Ehlers, ED Scribe. This patient was seen in room MH11/MH11 and the patient's care was started at 11:25 PM.  First MD Initiated Contact with Patient 11/30/13 2319     Chief Complaint  Patient presents with  . Back Pain    Patient is a 63 y.o. female presenting with back pain. The history is provided by the patient. No language interpreter was used.  Back Pain Location:  Lumbar spine and sacro-iliac joint Quality:  Aching and cramping Radiates to:  R posterior upper leg Pain severity:  Severe Pain is:  Same all the time Onset quality:  Gradual Duration:  3 weeks Timing:  Constant Progression:  Worsening Chronicity:  Chronic Context: not emotional stress, not falling and not jumping from heights   Relieved by:  Nothing Worsened by:  Nothing tried Ineffective treatments:  Muscle relaxants and ibuprofen Associated symptoms: no abdominal pain, no abdominal swelling, no bladder incontinence, no bowel incontinence, no chest pain, no dysuria, no fever, no headaches, no leg pain, no numbness, no paresthesias, no pelvic pain, no perianal numbness, no tingling, no weakness and no weight loss   Risk factors: obesity    HPI Comments: SHALETTA CORDERO is a 64 y.o. female, with a h/o sciatica and chronic pain, who presents to the Emergency Department complaining of right-sided back pain which radiates down her right leg and which has gradually increased over the past several weeks. Ms. Hagley also endorses right-sided suprapubic pain. She reports she has experienced back pain since she had back surgery 20 months ago in January 2014; her surgery was performed by Dr. Louanne Skye.  She has used Valium, oxycodone, and IBU every six hours without relief.  The pt was treated in the ED yesterday for similar symptoms.  She denies dysuria, difficulty urinating, polyuria, or constipation.  Her last BM was yesterday. She has an appointment with her PCP, Dr. Dennard Schaumann, in two days.  Past Medical History  Diagnosis Date  . GERD (gastroesophageal reflux disease)   . Hypothyroidism   . Peripheral neuropathy   . Cardiomyopathy   . Chest pain 06/2005    Hospitalized, dystolic dysfunction  . Sciatica   . Type II diabetes mellitus   . PONV (postoperative nausea and vomiting)   . H/O hiatal hernia   . Migraines     "it's been a long time since I've had one" (03/20/2012)  . Osteoarthritis   . Arthritis     "all over; mostly in my back" (03/20/2012)  . Chronic lower back pain     "have 2 herniated discs; going to have to have a fusion" (03/20/2012)  . Kidney stones     "never had OR" (03/20/2012)  . Breast cancer     "right" (03/20/2012)  . Family history of anesthesia complication     "father has nausea and vomiting"  . Anginal pain 03/19/2012    saw Dr. Cathie Olden  . Irritable bowel syndrome   . Hypertension     "patient states never had HTN, takes Hyzaar for heart   Past Surgical History  Procedure Laterality Date  . Abdominal hysterectomy  1993  . Total knee arthroplasty  2000    Right  . Mastectomy with axillary lymph node dissection  12/2003    "right" (03/20/2012)  . Thyroidectomy  1977    Right  . Posterior cervical fusion/foraminotomy  2001  . Shoulder arthroscopy w/ rotator cuff repair  2003    Left  . Cardiac catheterization  ? 2005 / 2007  . Adenosin cardiolite  07/2005    (+) wall motion abnormality  . Esophagogastroduodenoscopy  2005    GERD  . [place] of abdomen  07/2005    Fatty liver / kidney stones  . Ct abdomen and pelvis  02/2010    Same  . Knee arthroscopy  1990's    "right" (03/20/2012)  . Axillary lymph node dissection  2003    squamous cell cancer  . Back surgery     Family History  Problem Relation Age of Onset  . Hypertension Mother   . Diabetes Father   . Hypertension Father   . Hypertension Sister   . Diabetes Other     Family Hx.  of Diabetes  . Hypertension Other     Family History of HTN  . Hypertension Brother   . Hypertension Brother   . Diabetes Brother    History  Substance Use Topics  . Smoking status: Never Smoker   . Smokeless tobacco: Never Used  . Alcohol Use: No   No OB history provided.  Review of Systems  Constitutional: Negative for fever and weight loss.  Cardiovascular: Negative for chest pain.  Gastrointestinal: Negative for abdominal pain, constipation and bowel incontinence.  Endocrine: Negative for polyuria.  Genitourinary: Negative for bladder incontinence, dysuria, difficulty urinating and pelvic pain.  Musculoskeletal: Positive for back pain. Negative for gait problem.  Neurological: Negative for tingling, weakness, numbness, headaches and paresthesias.  All other systems reviewed and are negative.   Allergies  Dilaudid; Penicillins; Ampicillin; and Latex  Home Medications   Prior to Admission medications   Medication Sig Start Date End Date Taking? Authorizing Provider  aspirin 81 MG tablet Take 81 mg by mouth daily.    Historical Provider, MD  Cholecalciferol (VITAMIN D) 2000 UNITS CAPS Take 4,000 Units by mouth daily.    Historical Provider, MD  diazepam (VALIUM) 5 MG tablet Take 1 tablet (5 mg total) by mouth every 6 (six) hours as needed for muscle spasms (spasms). 11/29/13   Evelina Bucy, MD  docusate sodium 100 MG CAPS Take 100 mg by mouth 2 (two) times daily. 05/04/12   Epimenio Foot, PA-C  fluticasone (FLONASE) 50 MCG/ACT nasal spray Place 2 sprays into the nose daily. 09/20/12   Susy Frizzle, MD  insulin aspart (NOVOLOG) 100 UNIT/ML injection Inject 15 Units into the skin 3 (three) times daily before meals. 10/29/13   Susy Frizzle, MD  insulin glargine (LANTUS) 100 UNIT/ML injection Inject 45 Units into the skin 2 (two) times daily. 10/29/13   Susy Frizzle, MD  levothyroxine (SYNTHROID, LEVOTHROID) 100 MCG tablet Take 1 tablet (100 mcg total) by mouth daily.  10/29/13   Susy Frizzle, MD  losartan-hydrochlorothiazide (HYZAAR) 50-12.5 MG per tablet Take 1 tablet by mouth daily. 10/29/13   Susy Frizzle, MD  metFORMIN (GLUCOPHAGE) 850 MG tablet Take 1 tablet (850 mg total) by mouth 2 (two) times daily with a meal. 10/29/13   Susy Frizzle, MD  nitroGLYCERIN (NITROSTAT) 0.4 MG SL tablet Place 0.4 mg under the tongue every 5 (five) minutes as needed. Chest pain    Historical Provider, MD  oxyCODONE-acetaminophen (PERCOCET) 5-325 MG per tablet Take 1 tablet by mouth every 6 (six) hours as needed for moderate pain. 11/29/13   Evelina Bucy, MD  oxyCODONE-acetaminophen (PERCOCET/ROXICET) 5-325 MG per tablet Take  1 tablet by mouth every 4 (four) hours as needed. For pain 11/25/13   Susy Frizzle, MD  pregabalin (LYRICA) 150 MG capsule TAKE ONE CAPSULE BY MOUTH TWICE DAILY 11/25/13   Susy Frizzle, MD   Triage Vitals: BP 200/91  Pulse 82  Temp(Src) 98 F (36.7 C) (Oral)  Resp 16  Ht 5\' 5"  (1.651 m)  Wt 200 lb (90.719 kg)  BMI 33.28 kg/m2  SpO2 96%  Physical Exam  Constitutional: She is oriented to person, place, and time. She appears well-developed and well-nourished.  HENT:  Head: Normocephalic and atraumatic.  Mouth/Throat: Oropharynx is clear and moist.  Eyes: Pupils are equal, round, and reactive to light.  Neck: Normal range of motion. Neck supple.  Cardiovascular: Normal rate, regular rhythm and intact distal pulses.  Exam reveals no friction rub.   No murmur heard. Pulmonary/Chest: Effort normal and breath sounds normal. No respiratory distress. She has no wheezes. She has no rales.  Abdominal: Soft. Bowel sounds are normal. There is no tenderness. There is no rebound and no guarding.  Musculoskeletal: Normal range of motion. She exhibits no edema and no tenderness.  No step offs or crepitance or point tenderness of the c t or l spine  Neurological: She is alert and oriented to person, place, and time. She has normal reflexes.  Skin:  Skin is warm and dry.  Psychiatric: She has a normal mood and affect.    ED Course  Procedures (including critical care time)  DIAGNOSTIC STUDIES: Oxygen Saturation is 96% on room air, normal by my interpretation.    COORDINATION OF CARE:  11:35 PM- Discussed treatment plan with patient, and the patient agreed to the plan. The plan includes a UA and NSAIDs.   Labs Review Labs Reviewed - No data to display  Imaging Review Dg Lumbar Spine Complete  11/29/2013   CLINICAL DATA:  Low back pain with radicular symptoms of pain and numbness in the right lower extremity.  EXAM: LUMBAR SPINE - COMPLETE 4+ VIEW  COMPARISON:  Intraoperative films on 04/30/2012 as well as abdominal film on 05/02/2012.  FINDINGS: Posterior lumbar fusion hardware at the L4-5 and L5-S1 levels appears stable with no evidence of hardware failure or abnormal lucency surrounding hardware. Mild residual anterolisthesis of L4 on L5 noted of approximately 4-5 mm. No evidence of acute fracture. Mild degenerative changes are present at L3-4.  IMPRESSION: Stable appearance post PLIF at L4-5 and L5-S1. Mild anterolisthesis present of L4 on L5.   Electronically Signed   By: Aletta Edouard M.D.   On: 11/29/2013 16:41   Dg Pelvis 1-2 Views  11/29/2013   CLINICAL DATA:  Low back pain, pain and numbness going down RIGHT leg for 2 weeks, RIGHT hip pain, no injury  EXAM: PELVIS - 1-2 VIEW  COMPARISON:  02/11/2007  FINDINGS: Interval L4-S1 fusion.  Osseous demineralization.  Minimal narrowing of the hip joints bilaterally.  SI joints preserved.  No acute fracture, dislocation, or bone destruction.  IMPRESSION: Interval L4-S1 fusion.  No acute abnormalities.   Electronically Signed   By: Lavonia Dana M.D.   On: 11/29/2013 16:35   Dg Knee Complete 4 Views Right  11/29/2013   CLINICAL DATA:  Right knee pain  EXAM: RIGHT KNEE - COMPLETE 4+ VIEW  COMPARISON:  None.  FINDINGS: Total knee arthroplasty anatomically aligned. No breakage or loosening  of the hardware. No evidence of fracture or dislocation.  IMPRESSION: Total knee arthroplasty anatomically aligned. No acute bony pathology.  Electronically Signed   By: Maryclare Bean M.D.   On: 11/29/2013 16:33     EKG Interpretation None      MDM   Final diagnoses:  None    Patient has a UTI.  Will add macrobid.  Will also add meloxicam and robaxin to current regimen and have patient follow up Monday with her spine doctor for ongoing testing and treatment  I personally performed the services described in this documentation, which was scribed in my presence. The recorded information has been reviewed and is accurate.     Carlisle Beers, MD 12/01/13 [phone]

## 2013-11-30 NOTE — ED Notes (Signed)
Reports increased pain in back with spasms. Pt tearful in triage. Reports medications from yesterday aren't helping.

## 2013-12-01 ENCOUNTER — Encounter (HOSPITAL_BASED_OUTPATIENT_CLINIC_OR_DEPARTMENT_OTHER): Payer: Self-pay | Admitting: Emergency Medicine

## 2013-12-01 LAB — URINALYSIS, ROUTINE W REFLEX MICROSCOPIC
BILIRUBIN URINE: NEGATIVE
Glucose, UA: >1000 mg/dL — AB
Hgb urine dipstick: NEGATIVE
Ketones, ur: NEGATIVE mg/dL
Nitrite: NEGATIVE
PROTEIN: 100 mg/dL — AB
Specific Gravity, Urine: 1.022 (ref 1.005–1.030)
UROBILINOGEN UA: 1 mg/dL (ref 0.0–1.0)
pH: 7 (ref 5.0–8.0)

## 2013-12-01 LAB — URINE MICROSCOPIC-ADD ON

## 2013-12-01 MED ORDER — MELOXICAM 7.5 MG PO TABS: 7.5000 mg | ORAL_TABLET | Freq: Every day | ORAL | Status: DC

## 2013-12-01 MED ORDER — DEXAMETHASONE SODIUM PHOSPHATE 4 MG/ML IJ SOLN
INTRAMUSCULAR | Status: AC
  Administered 2013-12-01: 10 mg
  Filled 2013-12-01: qty 3

## 2013-12-01 MED ORDER — METHOCARBAMOL 500 MG PO TABS: 500.0000 mg | ORAL_TABLET | Freq: Two times a day (BID) | ORAL | Status: DC

## 2013-12-01 MED ORDER — MORPHINE SULFATE 2 MG/ML IJ SOLN
2.0000 mg | Freq: Once | INTRAMUSCULAR | Status: AC
  Administered 2013-12-01: 2 mg via INTRAVENOUS
  Filled 2013-12-01: qty 1

## 2013-12-01 MED ORDER — NITROFURANTOIN MONOHYD MACRO 100 MG PO CAPS
100.0000 mg | ORAL_CAPSULE | Freq: Once | ORAL | Status: AC
Start: 2013-12-01 — End: 2013-12-01
  Administered 2013-12-01: 100 mg via ORAL
  Filled 2013-12-01: qty 1

## 2013-12-01 MED ORDER — NITROFURANTOIN MONOHYD MACRO 100 MG PO CAPS: 100.0000 mg | ORAL_CAPSULE | Freq: Two times a day (BID) | ORAL | Status: DC

## 2013-12-02 ENCOUNTER — Encounter: Payer: Self-pay | Admitting: Family Medicine

## 2013-12-02 ENCOUNTER — Ambulatory Visit (INDEPENDENT_AMBULATORY_CARE_PROVIDER_SITE_OTHER): Payer: Commercial Managed Care - HMO | Admitting: Family Medicine

## 2013-12-02 VITALS — BP 138/72 | HR 68 | Resp 14 | Ht 65.0 in | Wt 204.0 lb

## 2013-12-02 DIAGNOSIS — M5430 Sciatica, unspecified side: Secondary | ICD-10-CM

## 2013-12-02 DIAGNOSIS — M5431 Sciatica, right side: Secondary | ICD-10-CM

## 2013-12-02 MED ORDER — PREDNISONE 20 MG PO TABS: ORAL_TABLET | ORAL | Status: DC

## 2013-12-02 NOTE — Progress Notes (Signed)
Subjective:    Patient ID: [name], female    DOB: 1951-03-09, 63 y.o.   MRN: [id number]  HPI Patient has a history of chronic low back pain. MRI obtained in 2013 revealed. 1. Increased severe bilateral facet arthritis at L4-5 with new  effusions in both joints. New small broad-based disc protrusion at  L4-5 creates increasingly severe spinal stenosis and increasingly  severe bilateral lateral recess stenosis which could affect either  or both L5 nerves.  2. Persistent left foraminal stenosis at L5-S1 with severe  bilateral facet arthritis, essentially unchanged.  Patient underwent surgery with Dr. Louanne Skye in 2013.  Her symptoms improved. Beginning late last week the patient developed severe low back pain radiating into her right hip down to her right knee and into her right foot. She reports burning and tingling in her right foot. She went to the emergency room on Friday as well as Monday. X-rays at that time of the lumbar spine revealed stable postoperative changes but no acute abnormalities. The patient was diagnosed as sciatica. She was started on soma, toxic him, and Percocet. This is not touching the pain at all. Patient is having a difficult time walking. She denies any symptoms of cauda equina syndrome. Her fasting blood sugars have ranged 130-209. The vast majority are 150-166. She is taking Lantus as directed however she is frequently forgotten her NovoLog. Past Medical History  Diagnosis Date  . GERD (gastroesophageal reflux disease)   . Hypothyroidism   . Peripheral neuropathy   . Cardiomyopathy   . Chest pain 06/2005    Hospitalized, dystolic dysfunction  . Sciatica   . Type II diabetes mellitus   . PONV (postoperative nausea and vomiting)   . H/O hiatal hernia   . Migraines     "it's been a long time since I've had one" (03/20/2012)  . Osteoarthritis   . Arthritis     "all over; mostly in my back" (03/20/2012)  . Chronic lower back pain     "have 2 herniated  discs; going to have to have a fusion" (03/20/2012)  . Kidney stones     "never had OR" (03/20/2012)  . Breast cancer     "right" (03/20/2012)  . Family history of anesthesia complication     "father has nausea and vomiting"  . Anginal pain 03/19/2012    saw Dr. Cathie Olden  . Irritable bowel syndrome   . Hypertension     "patient states never had HTN, takes Hyzaar for heart   Past Surgical History  Procedure Laterality Date  . Abdominal hysterectomy  1993  . Total knee arthroplasty  2000    Right  . Mastectomy with axillary lymph node dissection  12/2003    "right" (03/20/2012)  . Thyroidectomy  1977    Right  . Posterior cervical fusion/foraminotomy  2001  . Shoulder arthroscopy w/ rotator cuff repair  2003    Left  . Cardiac catheterization  ? 2005 / 2007  . Adenosin cardiolite  07/2005    (+) wall motion abnormality  . Esophagogastroduodenoscopy  2005    GERD  . [place] of abdomen  07/2005    Fatty liver / kidney stones  . Ct abdomen and pelvis  02/2010    Same  . Knee arthroscopy  1990's    "right" (03/20/2012)  . Axillary lymph node dissection  2003    squamous cell cancer  . Back surgery     Current Outpatient Prescriptions on File Prior to Visit  Medication Sig Dispense Refill  . aspirin 81 MG tablet Take 81 mg by mouth daily.      . Cholecalciferol (VITAMIN D) 2000 UNITS CAPS Take 4,000 Units by mouth daily.      . diazepam (VALIUM) 5 MG tablet Take 1 tablet (5 mg total) by mouth every 6 (six) hours as needed for muscle spasms (spasms).  20 tablet  0  . docusate sodium 100 MG CAPS Take 100 mg by mouth 2 (two) times daily.  10 capsule    . fluticasone (FLONASE) 50 MCG/ACT nasal spray Place 2 sprays into the nose daily.  48 g  1  . insulin aspart (NOVOLOG) 100 UNIT/ML injection Inject 15 Units into the skin 3 (three) times daily before meals.  10 mL  3  . insulin glargine (LANTUS) 100 UNIT/ML injection Inject 45 Units into the skin 2 (two) times daily.      [name] Kitchen  levothyroxine (SYNTHROID, LEVOTHROID) 100 MCG tablet Take 1 tablet (100 mcg total) by mouth daily.  30 tablet  11  . losartan-hydrochlorothiazide (HYZAAR) 50-12.5 MG per tablet Take 1 tablet by mouth daily.  30 tablet  11  . meloxicam (MOBIC) 7.5 MG tablet Take 1 tablet (7.5 mg total) by mouth daily.  7 tablet  0  . metFORMIN (GLUCOPHAGE) 850 MG tablet Take 1 tablet (850 mg total) by mouth 2 (two) times daily with a meal.  60 tablet  3  . methocarbamol (ROBAXIN) 500 MG tablet Take 1 tablet (500 mg total) by mouth 2 (two) times daily.  20 tablet  0  . nitrofurantoin, macrocrystal-monohydrate, (MACROBID) 100 MG capsule Take 1 capsule (100 mg total) by mouth 2 (two) times daily. X 7 days  14 capsule  0  . nitroGLYCERIN (NITROSTAT) 0.4 MG SL tablet Place 0.4 mg under the tongue every 5 (five) minutes as needed. Chest pain      . oxyCODONE-acetaminophen (PERCOCET) 5-325 MG per tablet Take 1 tablet by mouth every 6 (six) hours as needed for moderate pain.  20 tablet  0  . oxyCODONE-acetaminophen (PERCOCET/ROXICET) 5-325 MG per tablet Take 1 tablet by mouth every 4 (four) hours as needed. For pain  30 tablet  0  . pregabalin (LYRICA) 150 MG capsule TAKE ONE CAPSULE BY MOUTH TWICE DAILY  60 capsule  2   No current facility-administered medications on file prior to visit.   Allergies  Allergen Reactions  . Dilaudid [Hydromorphone Hcl] Nausea And Vomiting  . Penicillins   . Ampicillin Rash    REACTION: Rash and welts on her hands when she took it in the hospital  . Latex Itching, Dermatitis and Rash    REACTION: Patient had a reaction to tape after surgery and they told her she was allergic to Latex 03/20/2012 "if it's on too long it will break me out"   History   Social History  . Marital Status: Married    Spouse Name: N/A    Number of Children: 1  . Years of Education: N/A   Occupational History  . Retired since 2003    Social History Main Topics  . Smoking status: Never Smoker   .  Smokeless tobacco: Never Used  . Alcohol Use: No  . Drug Use: No  . Sexual Activity: Not on file   Other Topics Concern  . Not on file   Social History Narrative  . No narrative on file      Review of Systems  All other systems reviewed and are negative.  Objective:   Physical Exam  Vitals reviewed. Cardiovascular: Normal rate and regular rhythm.   Pulmonary/Chest: Effort normal and breath sounds normal.  Musculoskeletal:       Lumbar back: She exhibits decreased range of motion, tenderness, bony tenderness, pain and spasm.  Neurological: She displays normal reflexes. She exhibits normal muscle tone. Coordination normal.          Assessment & Plan:  Right sided sciatica - Plan: predniSONE (DELTASONE) 20 MG tablet  Abdomen the patient has right-sided sciatica. I recommended she contact her so followup appointment as soon as possible. Meanwhile she is failing conservative measures. Therefore I will start the patient on a prednisone taper pack. She'll take 60 mg a day on days 1 and 2, 40 mg a day on days 3 and 4, 20 mg a day on days 5 and 6.  She can use Percocet and valium as needed for pain. Also add novolog sliding scale  Continue 15 units with meals PLUS sliding scale correction: <150-0 151-200-4 units in addition to standing 15 untis. 201-250-6 [id number] Recheck in 1 week.

## 2013-12-04 ENCOUNTER — Telehealth: Payer: Self-pay | Admitting: *Deleted

## 2013-12-04 DIAGNOSIS — M5441 Lumbago with sciatica, right side: Secondary | ICD-10-CM

## 2013-12-04 NOTE — Telephone Encounter (Signed)
Called pt in reference to referral, pt is needing a referral from [place] to see Dr. Louanne Skye orthopedics for right sided sciatica pain at [place], ?ok to do referral.

## 2013-12-04 NOTE — Telephone Encounter (Signed)
Message copied by Maureen Chatters on Wed Dec 04, 2013  3:28 PM ------      Message from: Devoria Glassing      Created: Tue Dec 03, 2013  4:40 PM       Patient needs referral for her Mcarthur Rossetti to dr Louanne Skye office please call with questions to [phone] ------

## 2013-12-05 NOTE — Telephone Encounter (Signed)
Referral placed.

## 2013-12-05 NOTE — Telephone Encounter (Signed)
ok 

## 2013-12-06 ENCOUNTER — Telehealth: Payer: Self-pay | Admitting: *Deleted

## 2013-12-06 NOTE — Telephone Encounter (Signed)
Submitted [employer] referral thru [facility] for authorization for Dr. Linus Orn orth with authorization number [id number]

## 2013-12-08 ENCOUNTER — Encounter (HOSPITAL_COMMUNITY): Payer: Self-pay | Admitting: Emergency Medicine

## 2013-12-08 ENCOUNTER — Emergency Department (HOSPITAL_COMMUNITY)
Admission: EM | Admit: 2013-12-08 | Discharge: 2013-12-08 | Disposition: A | Discharge status: Home / Self Care | Payer: Medicare HMO | Attending: Emergency Medicine | Admitting: Emergency Medicine

## 2013-12-08 DIAGNOSIS — I10 Essential (primary) hypertension: Secondary | ICD-10-CM | POA: Diagnosis not present

## 2013-12-08 DIAGNOSIS — Z880 Allergy status to penicillin: Secondary | ICD-10-CM | POA: Insufficient documentation

## 2013-12-08 DIAGNOSIS — G8929 Other chronic pain: Secondary | ICD-10-CM | POA: Insufficient documentation

## 2013-12-08 DIAGNOSIS — R52 Pain, unspecified: Secondary | ICD-10-CM | POA: Diagnosis not present

## 2013-12-08 DIAGNOSIS — Z9889 Other specified postprocedural states: Secondary | ICD-10-CM | POA: Diagnosis not present

## 2013-12-08 DIAGNOSIS — G43909 Migraine, unspecified, not intractable, without status migrainosus: Secondary | ICD-10-CM | POA: Diagnosis not present

## 2013-12-08 DIAGNOSIS — Z8719 Personal history of other diseases of the digestive system: Secondary | ICD-10-CM | POA: Diagnosis not present

## 2013-12-08 DIAGNOSIS — Z91040 Latex allergy status: Secondary | ICD-10-CM | POA: Diagnosis not present

## 2013-12-08 DIAGNOSIS — M5430 Sciatica, unspecified side: Secondary | ICD-10-CM | POA: Diagnosis not present

## 2013-12-08 DIAGNOSIS — M545 Low back pain, unspecified: Secondary | ICD-10-CM

## 2013-12-08 DIAGNOSIS — Z853 Personal history of malignant neoplasm of breast: Secondary | ICD-10-CM | POA: Insufficient documentation

## 2013-12-08 DIAGNOSIS — G609 Hereditary and idiopathic neuropathy, unspecified: Secondary | ICD-10-CM | POA: Diagnosis not present

## 2013-12-08 DIAGNOSIS — Z794 Long term (current) use of insulin: Secondary | ICD-10-CM | POA: Diagnosis not present

## 2013-12-08 DIAGNOSIS — Z8673 Personal history of transient ischemic attack (TIA), and cerebral infarction without residual deficits: Secondary | ICD-10-CM | POA: Diagnosis not present

## 2013-12-08 DIAGNOSIS — E039 Hypothyroidism, unspecified: Secondary | ICD-10-CM | POA: Diagnosis not present

## 2013-12-08 DIAGNOSIS — Z7982 Long term (current) use of aspirin: Secondary | ICD-10-CM | POA: Insufficient documentation

## 2013-12-08 DIAGNOSIS — IMO0002 Reserved for concepts with insufficient information to code with codable children: Secondary | ICD-10-CM | POA: Insufficient documentation

## 2013-12-08 DIAGNOSIS — M1990 Unspecified osteoarthritis, unspecified site: Secondary | ICD-10-CM | POA: Insufficient documentation

## 2013-12-08 DIAGNOSIS — M25559 Pain in unspecified hip: Secondary | ICD-10-CM | POA: Diagnosis present

## 2013-12-08 DIAGNOSIS — E119 Type 2 diabetes mellitus without complications: Secondary | ICD-10-CM | POA: Diagnosis not present

## 2013-12-08 DIAGNOSIS — Z87442 Personal history of urinary calculi: Secondary | ICD-10-CM | POA: Diagnosis not present

## 2013-12-08 DIAGNOSIS — Z79899 Other long term (current) drug therapy: Secondary | ICD-10-CM | POA: Diagnosis not present

## 2013-12-08 MED ORDER — FENTANYL CITRATE 0.05 MG/ML IJ SOLN
100.0000 ug | Freq: Once | INTRAMUSCULAR | Status: AC
  Administered 2013-12-08: 100 ug via INTRAMUSCULAR

## 2013-12-08 MED ORDER — OXYCODONE HCL 5 MG PO TABS: 5.0000 mg | ORAL_TABLET | ORAL | Status: DC | PRN

## 2013-12-08 MED ORDER — OXYCODONE HCL 5 MG PO TABS
10.0000 mg | ORAL_TABLET | Freq: Once | ORAL | Status: AC
  Administered 2013-12-08: 10 mg via ORAL
  Filled 2013-12-08: qty 2

## 2013-12-08 MED ORDER — FENTANYL CITRATE 0.05 MG/ML IJ SOLN
100.0000 ug | Freq: Once | INTRAMUSCULAR | Status: DC
  Filled 2013-12-08: qty 2

## 2013-12-08 NOTE — ED Provider Notes (Signed)
CSN: [id number]     Arrival date & time 12/08/13  1834 History   First MD Initiated Contact with Patient 12/08/13 1950    This chart was scribed for NP, Junius Creamer, working with Leota Jacobsen, MD by Terressa Koyanagi, ED Scribe. This patient was seen in room WTR8/WTR8 and the patient's care was started at 8:06 PM.  Chief Complaint  Patient presents with  . Hip Pain    right   The history is provided by the patient.   HPI Comments: [name] is a 63 y.o. female, with a medical Hx noted below and significant for sciatica and chronic pain, who presents to the Emergency Department complaining of chronic, worsening, constant right-sided back pain radiating down to her right hip and leg onset following her back surgery in January 2014 and worsening over the past 3 weeks. Pt reports the pain is so severe that it wakes her up in the morning and has to ambulate with the assistance of a walker. Pt reports that Dr. Louanne Skye performed the surgery and she has a f/u with him in 5 days. Pt reports using 1 tab Percocet every six hours and 1 tab of Prednisone per day without relief. Pt reports no imaging has been done to date. Pt was seen for the same on 11/30/13 at the ED.   Past Medical History  Diagnosis Date  . GERD (gastroesophageal reflux disease)   . Hypothyroidism   . Peripheral neuropathy   . Cardiomyopathy   . Chest pain 06/2005    Hospitalized, dystolic dysfunction  . Sciatica   . Type II diabetes mellitus   . PONV (postoperative nausea and vomiting)   . H/O hiatal hernia   . Migraines     "it's been a long time since I've had one" (03/20/2012)  . Osteoarthritis   . Arthritis     "all over; mostly in my back" (03/20/2012)  . Chronic lower back pain     "have 2 herniated discs; going to have to have a fusion" (03/20/2012)  . Kidney stones     "never had OR" (03/20/2012)  . Breast cancer     "right" (03/20/2012)  . Family history of anesthesia complication     "father has nausea and  vomiting"  . Anginal pain 03/19/2012    saw Dr. Cathie Olden  . Irritable bowel syndrome   . Hypertension     "patient states never had HTN, takes Hyzaar for heart   Past Surgical History  Procedure Laterality Date  . Abdominal hysterectomy  1993  . Total knee arthroplasty  2000    Right  . Mastectomy with axillary lymph node dissection  12/2003    "right" (03/20/2012)  . Thyroidectomy  1977    Right  . Posterior cervical fusion/foraminotomy  2001  . Shoulder arthroscopy w/ rotator cuff repair  2003    Left  . Cardiac catheterization  ? 2005 / 2007  . Adenosin cardiolite  07/2005    (+) wall motion abnormality  . Esophagogastroduodenoscopy  2005    GERD  . [place] of abdomen  07/2005    Fatty liver / kidney stones  . Ct abdomen and pelvis  02/2010    Same  . Knee arthroscopy  1990's    "right" (03/20/2012)  . Axillary lymph node dissection  2003    squamous cell cancer  . Back surgery     Family History  Problem Relation Age of Onset  . Hypertension Mother   . Diabetes  Father   . Hypertension Father   . Hypertension Sister   . Diabetes Other     Family Hx. of Diabetes  . Hypertension Other     Family History of HTN  . Hypertension Brother   . Hypertension Brother   . Diabetes Brother    History  Substance Use Topics  . Smoking status: Never Smoker   . Smokeless tobacco: Never Used  . Alcohol Use: No   OB History   Grav Para Term Preterm Abortions TAB SAB Ect Mult Living                 Review of Systems  Constitutional: Negative for fever and chills.  Genitourinary: Negative for dysuria.  Musculoskeletal: Positive for back pain (right back pain).       Radiating right hip and right leg pain   Neurological: Positive for numbness. Negative for weakness.  Psychiatric/Behavioral: Negative for confusion.  All other systems reviewed and are negative.     Allergies  Dilaudid; Penicillins; Ampicillin; and Latex  Home Medications   Prior to Admission medications    Medication Sig Start Date End Date Taking? Authorizing Provider  aspirin 81 MG tablet Take 81 mg by mouth daily.   Yes Historical Provider, MD  Cholecalciferol (VITAMIN D) 2000 UNITS CAPS Take 4,000 Units by mouth daily.   Yes Historical Provider, MD  docusate sodium 100 MG CAPS Take 100 mg by mouth 2 (two) times daily. 05/04/12  Yes Epimenio Foot, PA-C  fluticasone (FLONASE) 50 MCG/ACT nasal spray Place 2 sprays into the nose daily. 09/20/12  Yes Susy Frizzle, MD  ibuprofen (ADVIL,MOTRIN) 200 MG tablet Take 600 mg by mouth every 4 (four) hours as needed for moderate pain.   Yes Historical Provider, MD  insulin aspart (NOVOLOG) 100 UNIT/ML injection Inject 15 Units into the skin 3 (three) times daily before meals. 10/29/13  Yes Susy Frizzle, MD  insulin glargine (LANTUS) 100 UNIT/ML injection Inject 45 Units into the skin 2 (two) times daily. 10/29/13  Yes Susy Frizzle, MD  levothyroxine (SYNTHROID, LEVOTHROID) 100 MCG tablet Take 1 tablet (100 mcg total) by mouth daily. 10/29/13  Yes Susy Frizzle, MD  losartan-hydrochlorothiazide (HYZAAR) 50-12.5 MG per tablet Take 1 tablet by mouth daily. 10/29/13  Yes Susy Frizzle, MD  metFORMIN (GLUCOPHAGE) 850 MG tablet Take 1 tablet (850 mg total) by mouth 2 (two) times daily with a meal. 10/29/13  Yes Susy Frizzle, MD  nitroGLYCERIN (NITROSTAT) 0.4 MG SL tablet Place 0.4 mg under the tongue every 5 (five) minutes as needed. Chest pain   Yes Historical Provider, MD  oxyCODONE-acetaminophen (PERCOCET) 5-325 MG per tablet Take 1 tablet by mouth every 6 (six) hours as needed for moderate pain. 11/29/13  Yes Evelina Bucy, MD  polyethylene glycol [facility] / GLYCOLAX) packet Take 17 g by mouth daily.   Yes Historical Provider, MD  predniSONE (DELTASONE) 20 MG tablet 3 tabs poqday 1-2, 2 tabs poqday 3-4, 1 tab poqday 5-6 12/02/13  Yes Susy Frizzle, MD  pregabalin (LYRICA) 150 MG capsule Take 150 mg by mouth 2 (two) times daily.   Yes Historical  Provider, MD  oxyCODONE (OXY IR/ROXICODONE) 5 MG immediate release tablet Take 1 tablet (5 mg total) by mouth every 4 (four) hours as needed for severe pain. 12/08/13   Garald Balding, NP   Triage Vitals: BP 179/81  Pulse 81  Temp(Src) 98.5 F (36.9 C) (Oral)  Resp 20  SpO2 97%  Physical Exam  Nursing note and vitals reviewed. Constitutional: She is oriented to person, place, and time. She appears well-developed and well-nourished. No distress.  HENT:  Head: Normocephalic and atraumatic.  Eyes: Conjunctivae are normal. Pupils are equal, round, and reactive to light.  Neck: Normal range of motion. Neck supple.  Cardiovascular: Normal rate and regular rhythm.   Pulmonary/Chest: Effort normal.  Musculoskeletal: Normal range of motion. She exhibits no edema.       Back:  Pain non-reproducible   Neurological: She is alert and oriented to person, place, and time.  Skin: Skin is warm and dry.  Psychiatric: She has a normal mood and affect. Her behavior is normal.    ED Course  Procedures (including critical care time) DIAGNOSTIC STUDIES: Oxygen Saturation is 97% on RA, nl by my interpretation.    COORDINATION OF CARE: 8:12 PM-Discussed treatment plan which includes imaging and increased doses of Percocet with pt at bedside and pt agreed to plan.   Labs Review Labs Reviewed - No data to display  Imaging Review No results found.   EKG Interpretation None      MDM   Final diagnoses:  Acute exacerbation of chronic low back pain   I will give the patient.  An injection of fentanyl in the emergency department to help control her pain.  I will also renew her prescription for oxycodone, so that she can take 5-10 mg every 4-6 hours as needed.  For pain, and also arrange for an MRI of the lumbar spine.  In the next one to 2 days, so that she will have this information.  When she sees Dr. Deno Etienne on Friday   I personally performed the services described in this documentation, which was  scribed in my presence. The recorded information has been reviewed and is accurate.    Garald Balding, NP 12/08/13 2025

## 2013-12-08 NOTE — Discharge Instructions (Signed)
Make sure you keep your appointment with Dr. Louanne Skye as scheduled on Friday

## 2013-12-08 NOTE — ED Notes (Addendum)
Pt c/o right hip pain every since back surgery in 2014. Pt states she has been waiting on Dr to give referral to an orthopedics, states she could have one on Friday but cant wait that long. States she cant walk.  Was seen at [place] and saturday.

## 2013-12-09 ENCOUNTER — Telehealth: Payer: Self-pay | Admitting: *Deleted

## 2013-12-09 NOTE — Telephone Encounter (Signed)
Received fax from [facility] [employer] with authorization number [id number] authorization faxed to Dr. Linus Orn ortho

## 2013-12-15 NOTE — ED Provider Notes (Signed)
Medical screening examination/treatment/procedure(s) were performed by non-physician practitioner and as supervising physician I was immediately available for consultation/collaboration.  Leota Jacobsen, MD 12/15/13 0930

## 2013-12-17 ENCOUNTER — Ambulatory Visit (HOSPITAL_COMMUNITY)
Admission: RE | Admit: 2013-12-17 | Discharge: 2013-12-17 | Disposition: A | Discharge status: Home / Self Care | Payer: Medicare HMO | Source: Ambulatory Visit | Attending: Emergency Medicine | Admitting: Emergency Medicine

## 2013-12-17 DIAGNOSIS — Z981 Arthrodesis status: Secondary | ICD-10-CM | POA: Diagnosis not present

## 2013-12-17 DIAGNOSIS — M5137 Other intervertebral disc degeneration, lumbosacral region: Secondary | ICD-10-CM | POA: Diagnosis not present

## 2013-12-17 DIAGNOSIS — M51379 Other intervertebral disc degeneration, lumbosacral region without mention of lumbar back pain or lower extremity pain: Secondary | ICD-10-CM | POA: Insufficient documentation

## 2013-12-17 DIAGNOSIS — R209 Unspecified disturbances of skin sensation: Secondary | ICD-10-CM | POA: Insufficient documentation

## 2013-12-17 DIAGNOSIS — M48061 Spinal stenosis, lumbar region without neurogenic claudication: Secondary | ICD-10-CM | POA: Insufficient documentation

## 2013-12-31 ENCOUNTER — Telehealth: Payer: Self-pay | Admitting: *Deleted

## 2013-12-31 MED ORDER — INSULIN GLARGINE 100 UNIT/ML SOLOSTAR PEN: 45.0000 [IU] | PEN_INJECTOR | Freq: Two times a day (BID) | SUBCUTANEOUS | Status: DC

## 2013-12-31 MED ORDER — INSULIN ASPART 100 UNIT/ML FLEXPEN: 15.0000 [IU] | PEN_INJECTOR | Freq: Three times a day (TID) | SUBCUTANEOUS | Status: DC

## 2013-12-31 MED ORDER — "PEN NEEDLES 5/16"" 30G X 8 MM MISC": Status: DC

## 2013-12-31 NOTE — Telephone Encounter (Signed)
Received call from pharmacy.   Patient is present in pharmacy and requested to change insulin from vials to pens.   Verbal orders given and prescriptions sent to pharmacy.

## 2014-01-07 ENCOUNTER — Ambulatory Visit (INDEPENDENT_AMBULATORY_CARE_PROVIDER_SITE_OTHER): Payer: Commercial Managed Care - HMO | Admitting: Family Medicine

## 2014-01-07 DIAGNOSIS — Z23 Encounter for immunization: Secondary | ICD-10-CM

## 2014-01-09 ENCOUNTER — Telehealth: Payer: Self-pay | Admitting: Family Medicine

## 2014-01-09 MED ORDER — GABAPENTIN 300 MG PO CAPS: 300.0000 mg | ORAL_CAPSULE | Freq: Three times a day (TID) | ORAL | Status: DC

## 2014-01-09 NOTE — Telephone Encounter (Signed)
DC lyrica and try gabapentin 300 mg potid

## 2014-01-09 NOTE — Telephone Encounter (Signed)
Pt called and Gabapentin RX to pharmacy.  Ley [place] know if any problems with new medication

## 2014-01-09 NOTE — Telephone Encounter (Signed)
Pt called and states that her insurance sent her a letter stating that Gabapentin was a cheaper option then Lyrica. Pt received lyrica from another MD and has never tried Gabapentin but if it is ok could we please try her on this as it would be cheaper for her???

## 2014-01-15 ENCOUNTER — Telehealth: Payer: Self-pay | Admitting: Family Medicine

## 2014-01-15 NOTE — Telephone Encounter (Signed)
Sherry from dr Arvil Persons office calling to see if we have completed the medical clearance form that they faxed over please call her back at [phone]

## 2014-01-15 NOTE — Telephone Encounter (Signed)
Patient is calling to check and see if we ever got an appointment for her with dr Louanne Skye  Please call her back at [phone]

## 2014-01-16 NOTE — Telephone Encounter (Signed)
Paper work has not been filled out yet and pt is aware.

## 2014-01-17 NOTE — Telephone Encounter (Signed)
Surg clearance was denied due to uncontrolled diabetes and pt is aware and appt made for follow up.

## 2014-01-20 ENCOUNTER — Ambulatory Visit (INDEPENDENT_AMBULATORY_CARE_PROVIDER_SITE_OTHER): Payer: Commercial Managed Care - HMO | Admitting: Family Medicine

## 2014-01-20 ENCOUNTER — Encounter: Payer: Self-pay | Admitting: Family Medicine

## 2014-01-20 VITALS — BP 140/64 | HR 74 | Temp 98.7°F | Resp 18 | Ht 65.0 in | Wt 197.0 lb

## 2014-01-20 DIAGNOSIS — E1165 Type 2 diabetes mellitus with hyperglycemia: Secondary | ICD-10-CM

## 2014-01-20 DIAGNOSIS — IMO0002 Reserved for concepts with insufficient information to code with codable children: Secondary | ICD-10-CM

## 2014-01-20 LAB — BASIC METABOLIC PANEL WITH GFR
BUN: 11 mg/dL (ref 6–23)
CALCIUM: 10.1 mg/dL (ref 8.4–10.5)
CO2: 25 mEq/L (ref 19–32)
Chloride: 107 mEq/L (ref 96–112)
Creat: 0.59 mg/dL (ref 0.50–1.10)
Glucose, Bld: 157 mg/dL — ABNORMAL HIGH (ref 70–99)
Potassium: 4.3 mEq/L (ref 3.5–5.3)
SODIUM: 141 meq/L (ref 135–145)

## 2014-01-20 LAB — HEMOGLOBIN A1C
HEMOGLOBIN A1C: 10 % — AB (ref <5.7)
MEAN PLASMA GLUCOSE: 240 mg/dL — AB (ref <117)

## 2014-01-20 NOTE — Progress Notes (Signed)
Subjective:    Patient ID: [name], female    DOB: 1950-05-02, 63 y.o.   MRN: [id number]  HPI Patient is scheduled for upcoming back surgery. She requested surgical clearance which I denied. I denied her surgical clearance because at her last ov in July her hemoglobin A1c was almost 14 and she was having blood sugars in the 300-400 range. I have not heard back from the patient since July regarding her blood sugar control. Therefore, I felt she was too high risk for surgery until we achieve better blood sugar control.  Ideally, I would  like to see her blood sugars in the 200s or less  prior to surgical clearance.  She presents today with four sugar readings over the last 4 days.  88, 130, 166, and 180. These are fasting.  She states that her sugars have typically been 100-230 over the last month. Past Medical History  Diagnosis Date  . GERD (gastroesophageal reflux disease)   . Hypothyroidism   . Peripheral neuropathy   . Cardiomyopathy   . Chest pain 06/2005    Hospitalized, dystolic dysfunction  . Sciatica   . Type II diabetes mellitus   . PONV (postoperative nausea and vomiting)   . H/O hiatal hernia   . Migraines     "it's been a long time since I've had one" (03/20/2012)  . Osteoarthritis   . Arthritis     "all over; mostly in my back" (03/20/2012)  . Chronic lower back pain     "have 2 herniated discs; going to have to have a fusion" (03/20/2012)  . Kidney stones     "never had OR" (03/20/2012)  . Breast cancer     "right" (03/20/2012)  . Family history of anesthesia complication     "father has nausea and vomiting"  . Anginal pain 03/19/2012    saw Dr. Cathie Olden  . Irritable bowel syndrome   . Hypertension     "patient states never had HTN, takes Hyzaar for heart   Past Surgical History  Procedure Laterality Date  . Abdominal hysterectomy  1993  . Total knee arthroplasty  2000    Right  . Mastectomy with axillary lymph node dissection  12/2003    "right"  (03/20/2012)  . Thyroidectomy  1977    Right  . Posterior cervical fusion/foraminotomy  2001  . Shoulder arthroscopy w/ rotator cuff repair  2003    Left  . Cardiac catheterization  ? 2005 / 2007  . Adenosin cardiolite  07/2005    (+) wall motion abnormality  . Esophagogastroduodenoscopy  2005    GERD  . [place] of abdomen  07/2005    Fatty liver / kidney stones  . Ct abdomen and pelvis  02/2010    Same  . Knee arthroscopy  1990's    "right" (03/20/2012)  . Axillary lymph node dissection  2003    squamous cell cancer  . Back surgery     Current Outpatient Prescriptions on File Prior to Visit  Medication Sig Dispense Refill  . aspirin 81 MG tablet Take 81 mg by mouth daily.      . Cholecalciferol (VITAMIN D) 2000 UNITS CAPS Take 4,000 Units by mouth daily.      [name] Kitchen docusate sodium 100 MG CAPS Take 100 mg by mouth 2 (two) times daily.  10 capsule    . fluticasone (FLONASE) 50 MCG/ACT nasal spray Place 2 sprays into the nose daily.  48 g  1  . gabapentin (NEURONTIN)  300 MG capsule Take 1 capsule (300 mg total) by mouth 3 (three) times daily.  90 capsule  3  . ibuprofen (ADVIL,MOTRIN) 200 MG tablet Take 600 mg by mouth every 4 (four) hours as needed for moderate pain.      [name] Kitchen insulin aspart (NOVOLOG) 100 UNIT/ML FlexPen Inject 15 Units into the skin 3 (three) times daily with meals.  5 pen  11  . Insulin Glargine (LANTUS SOLOSTAR) 100 UNIT/ML Solostar Pen Inject 45 Units into the skin 2 (two) times daily.  10 pen  11  . Insulin Pen Needle (PEN NEEDLES 5/16") 30G X 8 MM MISC Use to inject insulin 5x daily.  200 each  11  . levothyroxine (SYNTHROID, LEVOTHROID) 100 MCG tablet Take 1 tablet (100 mcg total) by mouth daily.  30 tablet  11  . losartan-hydrochlorothiazide (HYZAAR) 50-12.5 MG per tablet Take 1 tablet by mouth daily.  30 tablet  11  . metFORMIN (GLUCOPHAGE) 850 MG tablet Take 1 tablet (850 mg total) by mouth 2 (two) times daily with a meal.  60 tablet  3  . nitroGLYCERIN (NITROSTAT)  0.4 MG SL tablet Place 0.4 mg under the tongue every 5 (five) minutes as needed. Chest pain      . oxyCODONE (OXY IR/ROXICODONE) 5 MG immediate release tablet Take 1 tablet (5 mg total) by mouth every 4 (four) hours as needed for severe pain.  18 tablet  0  . polyethylene glycol (MIRALAX / GLYCOLAX) packet Take 17 g by mouth daily.       No current facility-administered medications on file prior to visit.   Allergies  Allergen Reactions  . Dilaudid [Hydromorphone Hcl] Nausea And Vomiting  . Penicillins   . Ampicillin Rash    REACTION: Rash and welts on her hands when she took it in the hospital  . Latex Itching, Dermatitis and Rash    REACTION: Patient had a reaction to tape after surgery and they told her she was allergic to Latex 03/20/2012 "if it's on too long it will break me out"   History   Social History  . Marital Status: Married    Spouse Name: N/A    Number of Children: 1  . Years of Education: N/A   Occupational History  . Retired since 2003    Social History Main Topics  . Smoking status: Never Smoker   . Smokeless tobacco: Never Used  . Alcohol Use: No  . Drug Use: No  . Sexual Activity: Not on file   Other Topics Concern  . Not on file   Social History Narrative  . No narrative on file      Review of Systems  All other systems reviewed and are negative.      Objective:   Physical Exam  Vitals reviewed. Constitutional: She appears well-developed and well-nourished.  Cardiovascular: Normal rate, regular rhythm and normal heart sounds.   Pulmonary/Chest: Effort normal and breath sounds normal. No respiratory distress. She has no wheezes. She has no rales. She exhibits no tenderness.  Abdominal: Soft. Bowel sounds are normal.          Assessment & Plan:  Diabetes mellitus type II, uncontrolled - Plan: Hemoglobin [id number], BASIC METABOLIC PANEL WITH GFR   It sounds as if her sugars would be safe for surgery. I would like to confirm this with a  hemoglobin A1c. If her hemoglobin A1c is less than 10 I will clear the patient for surgery. If her hemoglobin A1c is greater than  10, I question the accuracy of the blood sugar she is reporting. Therefore I would then want to see fasting blood sugars, and two-hour postprandial sugars over the next 3 days to further titrate her insulin.

## 2014-01-24 ENCOUNTER — Telehealth: Payer: Self-pay | Admitting: Family Medicine

## 2014-01-24 ENCOUNTER — Encounter: Payer: Self-pay | Admitting: Family Medicine

## 2014-01-24 NOTE — Telephone Encounter (Signed)
Message copied by Alyson Locket on Fri Jan 24, 2014 11:55 AM ------      Message from: Jenna Luo      Created: Fri Jan 24, 2014 11:53 AM       Patient should increase Lantus to 50 units twice daily. Continue NovoLog 15 units prior to meals. Her sugars are acceptable to be cleared for surgery. And we please contact Dr. Otho Ket office to grant surgical clearance ------

## 2014-01-24 NOTE — Telephone Encounter (Signed)
Pt aware and called placed to Dr. Louanne Skye at [facility] and surg clear faxed to [phone]

## 2014-01-28 ENCOUNTER — Encounter (HOSPITAL_COMMUNITY): Payer: Self-pay | Admitting: Pharmacy Technician

## 2014-01-28 ENCOUNTER — Other Ambulatory Visit (HOSPITAL_COMMUNITY): Payer: Self-pay | Admitting: Specialist

## 2014-01-29 NOTE — H&P (Signed)
[name] is an 63 y.o. female.   Chief Complain:  Right leg pain HPI:  She has undergone previous L4-5, L5-S1, fusion for spondylolisthesis, spinal stenosis and disk protrusions.  The surgery, done over a year and a half ago.  Returns with  more than 4 weeks of increasing severe pain to her back, radiation to her right lower extremity.  She reports that she cannot find a way to get comfortable.  She is sleeping sitting upright to try to decrease the pain.  Taking medicines of oxycodone and morphine.  Morphine extended release every 12 hours and oxycodone for breakthrough pain.  She has not had a specific injury.  No bowel or bladder dysfunction. Increasing pain in the right leg with standing, walking, and even sitting .  Some relief with lying down.  Increased pain with cough and sneeze.  It is in an L4 distribution consistent with disk herniation seen on her MRI study of the right side at L3-4 above her fusion site.  There is a free fragment that extends down along the ventral aspect of the right L4 nerve root.    Past Medical History  Diagnosis Date  . GERD (gastroesophageal reflux disease)   . Hypothyroidism   . Peripheral neuropathy   . Cardiomyopathy   . Chest pain 06/2005    Hospitalized, dystolic dysfunction  . Sciatica   . Type II diabetes mellitus   . PONV (postoperative nausea and vomiting)   . H/O hiatal hernia   . Migraines     "it's been a long time since I've had one" (03/20/2012)  . Osteoarthritis   . Arthritis     "all over; mostly in my back" (03/20/2012)  . Chronic lower back pain     "have 2 herniated discs; going to have to have a fusion" (03/20/2012)  . Kidney stones     "never had OR" (03/20/2012)  . Breast cancer     "right" (03/20/2012)  . Family history of anesthesia complication     "father has nausea and vomiting"  . Anginal pain 03/19/2012    saw Dr. Cathie Olden  . Irritable bowel syndrome   . Hypertension     "patient states never had HTN, takes Hyzaar  for heart    Past Surgical History  Procedure Laterality Date  . Abdominal hysterectomy  1993  . Total knee arthroplasty  2000    Right  . Mastectomy with axillary lymph node dissection  12/2003    "right" (03/20/2012)  . Thyroidectomy  1977    Right  . Posterior cervical fusion/foraminotomy  2001  . Shoulder arthroscopy w/ rotator cuff repair  2003    Left  . Cardiac catheterization  ? 2005 / 2007  . Adenosin cardiolite  07/2005    (+) wall motion abnormality  . Esophagogastroduodenoscopy  2005    GERD  . [place] of abdomen  07/2005    Fatty liver / kidney stones  . Ct abdomen and pelvis  02/2010    Same  . Knee arthroscopy  1990's    "right" (03/20/2012)  . Axillary lymph node dissection  2003    squamous cell cancer  . Back surgery      Family History  Problem Relation Age of Onset  . Hypertension Mother   . Diabetes Father   . Hypertension Father   . Hypertension Sister   . Diabetes Other     Family Hx. of Diabetes  . Hypertension Other     Family History  of HTN  . Hypertension Brother   . Hypertension Brother   . Diabetes Brother    Social History:  reports that she has never smoked. She has never used smokeless tobacco. She reports that she does not drink alcohol or use illicit drugs.  Allergies:  Allergies  Allergen Reactions  . Dilaudid [Hydromorphone Hcl] Nausea And Vomiting  . Ampicillin Rash    REACTION: Rash and welts on her hands when she took it in the hospital  . Latex Itching, Dermatitis and Rash    REACTION: Patient had a reaction to tape after surgery and they told her she was allergic to Latex 03/20/2012 "if it's on too long it will break me out"  . Penicillins Rash    No prescriptions prior to admission    No results found for this or any previous visit (from the past 48 hour(s)). No results found.  Review of Systems  Musculoskeletal: Positive for back pain.  Neurological: Positive for tingling, sensory change and focal weakness.        Right leg  All other systems reviewed and are negative.   There were no vitals taken for this visit. Physical Exam  Constitutional: She is oriented to person, place, and time. She appears well-developed and well-nourished.  HENT:  Head: Normocephalic and atraumatic.  Eyes: EOM are normal. Pupils are equal, round, and reactive to light.  Neck: Neck supple.  Cardiovascular: Normal rate.   Respiratory: Effort normal.  GI: Soft.  Musculoskeletal:  PHYSICAL EXAMINATION:  Her exam shows that she has some very mild weakness in right foot dorsiflexion at 5-/5.  Right knee extension is also weak at 5-/5.  Sciatic tension tests are uncomfortable with positive straight leg raise on the right at 30 degrees short of full extension.  She has difficulty standing and walking due to the narrowing of the lateral recess along the right side at 3-4 and the neural foramen for the 4 root here.   Bilateral feet edema.  No pretibial edema  Neurological: She is alert and oriented to person, place, and time.  Skin: Skin is warm and dry.     Assessment/Plan HNP right L3-4 with radiculopathy and MRI findings of free fragment compressing the right L4 nerve root.  PLAN:  Microdiscectomy right L3-4 above instrumented fusion L3-L5  VERNON,SHEILA M 01/29/2014, 11:15 AM

## 2014-02-03 ENCOUNTER — Encounter (HOSPITAL_COMMUNITY): Payer: Self-pay

## 2014-02-03 ENCOUNTER — Encounter (HOSPITAL_COMMUNITY)
Admission: RE | Admit: 2014-02-03 | Discharge: 2014-02-03 | Disposition: A | Discharge status: Home / Self Care | Payer: Commercial Managed Care - HMO | Source: Ambulatory Visit | Attending: Specialist | Admitting: Specialist

## 2014-02-03 DIAGNOSIS — E039 Hypothyroidism, unspecified: Secondary | ICD-10-CM | POA: Diagnosis not present

## 2014-02-03 DIAGNOSIS — Z981 Arthrodesis status: Secondary | ICD-10-CM | POA: Diagnosis not present

## 2014-02-03 DIAGNOSIS — D649 Anemia, unspecified: Secondary | ICD-10-CM | POA: Diagnosis not present

## 2014-02-03 DIAGNOSIS — Z9011 Acquired absence of right breast and nipple: Secondary | ICD-10-CM | POA: Diagnosis not present

## 2014-02-03 DIAGNOSIS — K219 Gastro-esophageal reflux disease without esophagitis: Secondary | ICD-10-CM | POA: Diagnosis not present

## 2014-02-03 DIAGNOSIS — M4806 Spinal stenosis, lumbar region: Secondary | ICD-10-CM | POA: Diagnosis not present

## 2014-02-03 DIAGNOSIS — E119 Type 2 diabetes mellitus without complications: Secondary | ICD-10-CM | POA: Diagnosis not present

## 2014-02-03 DIAGNOSIS — Z853 Personal history of malignant neoplasm of breast: Secondary | ICD-10-CM | POA: Diagnosis not present

## 2014-02-03 DIAGNOSIS — I10 Essential (primary) hypertension: Secondary | ICD-10-CM | POA: Diagnosis not present

## 2014-02-03 DIAGNOSIS — M5116 Intervertebral disc disorders with radiculopathy, lumbar region: Secondary | ICD-10-CM | POA: Diagnosis present

## 2014-02-03 HISTORY — DX: Acquired absence of right breast and nipple: Z90.11

## 2014-02-03 LAB — COMPREHENSIVE METABOLIC PANEL
ALT: 12 U/L (ref 0–35)
AST: 15 U/L (ref 0–37)
Albumin: 3.5 g/dL (ref 3.5–5.2)
Alkaline Phosphatase: 58 U/L (ref 39–117)
Anion gap: 13 (ref 5–15)
BUN: 14 mg/dL (ref 6–23)
CALCIUM: 10 mg/dL (ref 8.4–10.5)
CO2: 25 mEq/L (ref 19–32)
CREATININE: 0.54 mg/dL (ref 0.50–1.10)
Chloride: 105 mEq/L (ref 96–112)
GFR calc Af Amer: >90 mL/min (ref >90)
Glucose, Bld: 180 mg/dL — ABNORMAL HIGH (ref 70–99)
Potassium: 4 mEq/L (ref 3.7–5.3)
SODIUM: 143 meq/L (ref 137–147)
TOTAL PROTEIN: 7.5 g/dL (ref 6.0–8.3)
Total Bilirubin: 0.3 mg/dL (ref 0.3–1.2)

## 2014-02-03 LAB — CBC
HCT: 42.3 % (ref 36.0–46.0)
Hemoglobin: 13.6 g/dL (ref 12.0–15.0)
MCH: 29.4 pg (ref 26.0–34.0)
MCHC: 32.2 g/dL (ref 30.0–36.0)
MCV: 91.4 fL (ref 78.0–100.0)
PLATELETS: 261 10*3/uL (ref 150–400)
RBC: 4.63 MIL/uL (ref 3.87–5.11)
RDW: 13.9 % (ref 11.5–15.5)
WBC: 5.6 10*3/uL (ref 4.0–10.5)

## 2014-02-03 LAB — SURGICAL PCR SCREEN
MRSA, PCR: NEGATIVE
Staphylococcus aureus: POSITIVE — AB

## 2014-02-03 MED ORDER — VANCOMYCIN HCL IN DEXTROSE 1-5 GM/200ML-% IV SOLN
1000.0000 mg | INTRAVENOUS | Status: AC
  Administered 2014-02-04: 1000 mg via INTRAVENOUS
  Filled 2014-02-03: qty 200

## 2014-02-03 NOTE — Pre-Procedure Instructions (Signed)
[name]  02/03/2014   Your procedure is scheduled on:  Tuesday, Oct. 27th   Report to [facility] Admitting at  11:30 AM.   Call this number if you have problems the morning of surgery: [phone]   Remember:   Do not eat food or drink liquids after midnight Monday.   Take these medicines the morning of surgery with A SIP OF WATER: Gabapentin, Levothyroxine, pain medication if needed.             DO NOT TAKE any diabetes medication the morning of surgery.   Do not wear jewelry, make-up or nail polish.  Do not wear lotions, powders, or perfumes. You may NOT wear deodorant the day of surgery.  Do not shave underarms & legs 48 hours prior to surgery.    Do not bring valuables to the hospital.  [facility] is not responsible for any belongings or valuables.               Contacts, dentures or bridgework may not be worn into surgery.  Leave suitcase in the car. After surgery it may be brought to your room.  For patients admitted to the hospital, discharge time is determined by your treatment team.    Name and phone number of your driver:    Special Instructions: "Preparing for Surgery" instruction sheet.   Please read over the following fact sheets that you were given: Pain Booklet, Coughing and Deep Breathing, MRSA Information and Surgical Site Infection Prevention

## 2014-02-03 NOTE — Progress Notes (Signed)
Anesthesia Chart Review:  Pt is 63 year old female scheduled for R L2-3 microdiscectomy on 02/04/2014 with Dr. Louanne Skye.   PMH: HTN, cardiomyopathy (NICM from chemo), DM, breast cancer, hypothyroidism, GERD  Medications include: ASA, losartan/hctz, nitroglycerin, insulin, metformin, levothyroxine, gabapentin  Preoperative labs reviewed.  Glucose 180  EKG: Sinus bradycardia Moderate voltage criteria for LVH, may be normal variant Nonspecific T wave abnormality  Consult note in EPIC by Jenell Milliner dated 03/20/2012 states: Cath 2002 (after anteroapical ischemia noted on stress test) showed normal coronary arteries and LV function. Right/Left Cath 2007 (after stress showed apical defect and decreased LV function) showed normal coronary arteries, global hypokinesis, EF 40%. It was suspected she had NICM r/t chemotherapy. She followed up with Dr.Nishan where it was noted she continued to have atypical chest pain that was noncardiac in etiology. Repeat Echo in 2007 showed EF 55%, mild inferior wall hypokinesis, no significant valvular abnormalities.  2D echo 03/20/2012:  Left ventricle: The cavity size was normal. Wall thickness was normal. Systolic function was normal. The estimated ejection fraction was in the range of 55% to 60%. Wall motion was normal; there were no regional wall motion abnormalities. Doppler parameters are consistent with abnormal left ventricular relaxation (grade 1 diastolic dysfunction).  If no changes, I anticipate pt can proceed with surgery as scheduled.   Willeen Cass, FNP-BC [facility]/Anesthesiology Phone: [phone] 02/03/2014 4:15 PM

## 2014-02-03 NOTE — Progress Notes (Signed)
Has not seen a cardio MD "in yrs" .[name] She does have neuropathy d/t the chemo she received in 2003 & 2005

## 2014-02-03 NOTE — Progress Notes (Signed)
Pt not contacted regarding positive staph lab result. Please treat with Mupirocin on DOS and make pt aware or result.

## 2014-02-04 ENCOUNTER — Encounter (HOSPITAL_COMMUNITY): Payer: Self-pay | Admitting: *Deleted

## 2014-02-04 ENCOUNTER — Encounter (HOSPITAL_COMMUNITY)
Admission: RE | Disposition: A | Discharge status: Home / Self Care | Payer: Self-pay | Source: Ambulatory Visit | Attending: Specialist

## 2014-02-04 ENCOUNTER — Ambulatory Visit (HOSPITAL_COMMUNITY): Payer: Commercial Managed Care - HMO | Admitting: Anesthesiology

## 2014-02-04 ENCOUNTER — Observation Stay (HOSPITAL_COMMUNITY)
Admission: RE | Admit: 2014-02-04 | Discharge: 2014-02-05 | Disposition: A | Discharge status: Home / Self Care | Payer: Commercial Managed Care - HMO | Source: Ambulatory Visit | Attending: Specialist | Admitting: Specialist

## 2014-02-04 ENCOUNTER — Encounter (HOSPITAL_COMMUNITY): Payer: Commercial Managed Care - HMO | Admitting: Emergency Medicine

## 2014-02-04 ENCOUNTER — Ambulatory Visit (HOSPITAL_COMMUNITY): Payer: Commercial Managed Care - HMO

## 2014-02-04 DIAGNOSIS — Z853 Personal history of malignant neoplasm of breast: Secondary | ICD-10-CM | POA: Insufficient documentation

## 2014-02-04 DIAGNOSIS — Z9011 Acquired absence of right breast and nipple: Secondary | ICD-10-CM | POA: Insufficient documentation

## 2014-02-04 DIAGNOSIS — K219 Gastro-esophageal reflux disease without esophagitis: Secondary | ICD-10-CM | POA: Insufficient documentation

## 2014-02-04 DIAGNOSIS — D649 Anemia, unspecified: Secondary | ICD-10-CM | POA: Insufficient documentation

## 2014-02-04 DIAGNOSIS — M4806 Spinal stenosis, lumbar region: Secondary | ICD-10-CM | POA: Diagnosis not present

## 2014-02-04 DIAGNOSIS — M5116 Intervertebral disc disorders with radiculopathy, lumbar region: Secondary | ICD-10-CM | POA: Diagnosis not present

## 2014-02-04 DIAGNOSIS — M5126 Other intervertebral disc displacement, lumbar region: Secondary | ICD-10-CM | POA: Diagnosis present

## 2014-02-04 DIAGNOSIS — Z981 Arthrodesis status: Secondary | ICD-10-CM | POA: Insufficient documentation

## 2014-02-04 DIAGNOSIS — E119 Type 2 diabetes mellitus without complications: Secondary | ICD-10-CM | POA: Insufficient documentation

## 2014-02-04 DIAGNOSIS — I10 Essential (primary) hypertension: Secondary | ICD-10-CM | POA: Insufficient documentation

## 2014-02-04 DIAGNOSIS — E039 Hypothyroidism, unspecified: Secondary | ICD-10-CM | POA: Insufficient documentation

## 2014-02-04 HISTORY — PX: LUMBAR LAMINECTOMY/DECOMPRESSION MICRODISCECTOMY: SHX5026

## 2014-02-04 LAB — GLUCOSE, CAPILLARY
GLUCOSE-CAPILLARY: 182 mg/dL — AB (ref 70–99)
GLUCOSE-CAPILLARY: 351 mg/dL — AB (ref 70–99)
Glucose-Capillary: 207 mg/dL — ABNORMAL HIGH (ref 70–99)

## 2014-02-04 SURGERY — LUMBAR LAMINECTOMY/DECOMPRESSION MICRODISCECTOMY
Anesthesia: General | Site: Spine Lumbar

## 2014-02-04 MED ORDER — FENTANYL CITRATE 0.05 MG/ML IJ SOLN
INTRAMUSCULAR | Status: DC | PRN
  Administered 2014-02-04: 50 ug via INTRAVENOUS
  Administered 2014-02-04: 100 ug via INTRAVENOUS
  Administered 2014-02-04 (×3): 50 ug via INTRAVENOUS

## 2014-02-04 MED ORDER — HYDRALAZINE HCL 20 MG/ML IJ SOLN
INTRAMUSCULAR | Status: AC
  Filled 2014-02-04: qty 1

## 2014-02-04 MED ORDER — DEXAMETHASONE SODIUM PHOSPHATE 4 MG/ML IJ SOLN
INTRAMUSCULAR | Status: AC
Start: 2014-02-04 — End: 2014-02-05
  Filled 2014-02-04: qty 1

## 2014-02-04 MED ORDER — BUPIVACAINE LIPOSOME 1.3 % IJ SUSP
20.0000 mL | Freq: Once | INTRAMUSCULAR | Status: AC
  Administered 2014-02-04: 20 mL
  Filled 2014-02-04: qty 20

## 2014-02-04 MED ORDER — MORPHINE SULFATE 2 MG/ML IJ SOLN: 1.0000 mg | INTRAMUSCULAR | Status: DC | PRN

## 2014-02-04 MED ORDER — THROMBIN 20000 UNITS EX KIT
PACK | CUTANEOUS | Status: DC | PRN
  Administered 2014-02-04: 15:00:00 via TOPICAL

## 2014-02-04 MED ORDER — CHLORHEXIDINE GLUCONATE 4 % EX LIQD
60.0000 mL | Freq: Once | CUTANEOUS | Status: DC
  Filled 2014-02-04: qty 60

## 2014-02-04 MED ORDER — DOCUSATE SODIUM 100 MG PO CAPS
100.0000 mg | ORAL_CAPSULE | Freq: Two times a day (BID) | ORAL | Status: DC
  Administered 2014-02-04 – 2014-02-05 (×2): 100 mg via ORAL
  Filled 2014-02-04 (×3): qty 1

## 2014-02-04 MED ORDER — DEXAMETHASONE SODIUM PHOSPHATE 4 MG/ML IJ SOLN
4.0000 mg | Freq: Once | INTRAMUSCULAR | Status: AC
  Administered 2014-02-04: 4 mg via INTRAVENOUS

## 2014-02-04 MED ORDER — VANCOMYCIN HCL IN DEXTROSE 1-5 GM/200ML-% IV SOLN
1000.0000 mg | Freq: Two times a day (BID) | INTRAVENOUS | Status: DC
  Administered 2014-02-05: 1000 mg via INTRAVENOUS
  Filled 2014-02-04 (×2): qty 200

## 2014-02-04 MED ORDER — FENTANYL CITRATE 0.05 MG/ML IJ SOLN
INTRAMUSCULAR | Status: AC
  Filled 2014-02-04: qty 5

## 2014-02-04 MED ORDER — CHLORHEXIDINE GLUCONATE 4 % EX LIQD: 60.0000 mL | Freq: Once | CUTANEOUS | Status: DC

## 2014-02-04 MED ORDER — BUPIVACAINE HCL (PF) 0.25 % IJ SOLN
INTRAMUSCULAR | Status: DC | PRN
  Administered 2014-02-04: 20 mL

## 2014-02-04 MED ORDER — FLEET ENEMA 7-19 GM/118ML RE ENEM
1.0000 | ENEMA | Freq: Once | RECTAL | Status: AC | PRN
  Filled 2014-02-04: qty 1

## 2014-02-04 MED ORDER — DEXAMETHASONE SODIUM PHOSPHATE 4 MG/ML IJ SOLN
INTRAMUSCULAR | Status: DC | PRN
  Administered 2014-02-04: 4 mg via INTRAVENOUS

## 2014-02-04 MED ORDER — MIDAZOLAM HCL 5 MG/5ML IJ SOLN
INTRAMUSCULAR | Status: DC | PRN
  Administered 2014-02-04: 2 mg via INTRAVENOUS

## 2014-02-04 MED ORDER — LACTATED RINGERS IV SOLN: INTRAVENOUS | Status: DC

## 2014-02-04 MED ORDER — LOSARTAN POTASSIUM-HCTZ 50-12.5 MG PO TABS: 1.0000 | ORAL_TABLET | Freq: Every day | ORAL | Status: DC

## 2014-02-04 MED ORDER — SODIUM CHLORIDE 0.9 % IV SOLN: INTRAVENOUS | Status: DC

## 2014-02-04 MED ORDER — METOCLOPRAMIDE HCL 5 MG/ML IJ SOLN
INTRAMUSCULAR | Status: AC
  Filled 2014-02-04: qty 2

## 2014-02-04 MED ORDER — BUPIVACAINE-EPINEPHRINE (PF) 0.25% -1:200000 IJ SOLN
INTRAMUSCULAR | Status: AC
  Filled 2014-02-04: qty 30

## 2014-02-04 MED ORDER — PROMETHAZINE HCL 25 MG/ML IJ SOLN
6.2500 mg | Freq: Once | INTRAMUSCULAR | Status: AC
  Administered 2014-02-04: 6.25 mg via INTRAVENOUS

## 2014-02-04 MED ORDER — ACETAMINOPHEN 650 MG RE SUPP: 650.0000 mg | RECTAL | Status: DC | PRN

## 2014-02-04 MED ORDER — DEXAMETHASONE SODIUM PHOSPHATE 4 MG/ML IJ SOLN
INTRAMUSCULAR | Status: AC
  Filled 2014-02-04: qty 1

## 2014-02-04 MED ORDER — ONDANSETRON HCL 4 MG/2ML IJ SOLN: 4.0000 mg | INTRAMUSCULAR | Status: DC | PRN

## 2014-02-04 MED ORDER — PROPOFOL 10 MG/ML IV BOLUS
INTRAVENOUS | Status: DC | PRN
  Administered 2014-02-04: 100 mg via INTRAVENOUS

## 2014-02-04 MED ORDER — ONDANSETRON HCL 4 MG/2ML IJ SOLN
INTRAMUSCULAR | Status: DC | PRN
  Administered 2014-02-04: 4 mg via INTRAVENOUS

## 2014-02-04 MED ORDER — SCOPOLAMINE 1 MG/3DAYS TD PT72
MEDICATED_PATCH | TRANSDERMAL | Status: AC
  Filled 2014-02-04: qty 1

## 2014-02-04 MED ORDER — LOSARTAN POTASSIUM 50 MG PO TABS
50.0000 mg | ORAL_TABLET | Freq: Every day | ORAL | Status: DC
  Administered 2014-02-04 – 2014-02-05 (×2): 50 mg via ORAL
  Filled 2014-02-04 (×2): qty 1

## 2014-02-04 MED ORDER — ONDANSETRON HCL 4 MG/2ML IJ SOLN
4.0000 mg | Freq: Once | INTRAMUSCULAR | Status: AC
  Administered 2014-02-04: 4 mg via INTRAVENOUS

## 2014-02-04 MED ORDER — HYDROCODONE-ACETAMINOPHEN 5-325 MG PO TABS: 1.0000 | ORAL_TABLET | ORAL | Status: DC | PRN

## 2014-02-04 MED ORDER — THROMBIN 20000 UNITS EX SOLR
CUTANEOUS | Status: AC
  Filled 2014-02-04: qty 20000

## 2014-02-04 MED ORDER — SODIUM CHLORIDE 0.9 % IJ SOLN: 3.0000 mL | INTRAMUSCULAR | Status: DC | PRN

## 2014-02-04 MED ORDER — THROMBIN 5000 UNITS EX SOLR
CUTANEOUS | Status: AC
Start: 2014-02-04 — End: 2014-02-04
  Filled 2014-02-04: qty 5000

## 2014-02-04 MED ORDER — SODIUM CHLORIDE 0.9 % IJ SOLN
INTRAMUSCULAR | Status: AC
Start: 2014-02-04 — End: 2014-02-04
  Filled 2014-02-04: qty 10

## 2014-02-04 MED ORDER — MENTHOL 3 MG MT LOZG: 1.0000 | LOZENGE | OROMUCOSAL | Status: DC | PRN

## 2014-02-04 MED ORDER — PHENOL 1.4 % MT LIQD: 1.0000 | OROMUCOSAL | Status: DC | PRN

## 2014-02-04 MED ORDER — LACTATED RINGERS IV SOLN
INTRAVENOUS | Status: DC
  Administered 2014-02-04: 12:00:00 via INTRAVENOUS

## 2014-02-04 MED ORDER — PROMETHAZINE HCL 25 MG/ML IJ SOLN
INTRAMUSCULAR | Status: AC
  Filled 2014-02-04: qty 1

## 2014-02-04 MED ORDER — LEVOTHYROXINE SODIUM 100 MCG PO TABS
100.0000 ug | ORAL_TABLET | Freq: Every day | ORAL | Status: DC
  Administered 2014-02-05: 100 ug via ORAL
  Filled 2014-02-04 (×2): qty 1

## 2014-02-04 MED ORDER — LIDOCAINE HCL (CARDIAC) 20 MG/ML IV SOLN
INTRAVENOUS | Status: DC | PRN
  Administered 2014-02-04: 40 mg via INTRAVENOUS

## 2014-02-04 MED ORDER — ASPIRIN 81 MG PO TABS: 81.0000 mg | ORAL_TABLET | Freq: Every day | ORAL | Status: DC

## 2014-02-04 MED ORDER — PANTOPRAZOLE SODIUM 40 MG IV SOLR
40.0000 mg | Freq: Every day | INTRAVENOUS | Status: DC
  Administered 2014-02-04: 40 mg via INTRAVENOUS
  Filled 2014-02-04 (×2): qty 40

## 2014-02-04 MED ORDER — DIPHENHYDRAMINE HCL 50 MG/ML IJ SOLN
INTRAMUSCULAR | Status: DC | PRN
  Administered 2014-02-04: 10 mg via INTRAVENOUS

## 2014-02-04 MED ORDER — GABAPENTIN 300 MG PO CAPS
300.0000 mg | ORAL_CAPSULE | Freq: Three times a day (TID) | ORAL | Status: DC
  Administered 2014-02-04 – 2014-02-05 (×2): 300 mg via ORAL
  Filled 2014-02-04 (×4): qty 1

## 2014-02-04 MED ORDER — BUPIVACAINE HCL (PF) 0.25 % IJ SOLN
INTRAMUSCULAR | Status: AC
  Filled 2014-02-04: qty 30

## 2014-02-04 MED ORDER — VITAMIN D3 25 MCG (1000 UNIT) PO TABS
4000.0000 [IU] | ORAL_TABLET | Freq: Every day | ORAL | Status: DC
  Administered 2014-02-05: 4000 [IU] via ORAL
  Filled 2014-02-04: qty 4

## 2014-02-04 MED ORDER — MUPIROCIN 2 % EX OINT
TOPICAL_OINTMENT | Freq: Two times a day (BID) | CUTANEOUS | Status: DC
  Administered 2014-02-04 – 2014-02-05 (×2): via NASAL

## 2014-02-04 MED ORDER — EPHEDRINE SULFATE 50 MG/ML IJ SOLN
INTRAMUSCULAR | Status: DC | PRN
  Administered 2014-02-04: 10 mg via INTRAVENOUS

## 2014-02-04 MED ORDER — ALUM & MAG HYDROXIDE-SIMETH 200-200-20 MG/5ML PO SUSP: 30.0000 mL | Freq: Four times a day (QID) | ORAL | Status: DC | PRN

## 2014-02-04 MED ORDER — METHOCARBAMOL 500 MG PO TABS: 500.0000 mg | ORAL_TABLET | Freq: Four times a day (QID) | ORAL | Status: DC | PRN

## 2014-02-04 MED ORDER — GLYCOPYRROLATE 0.2 MG/ML IJ SOLN
INTRAMUSCULAR | Status: AC
  Filled 2014-02-04: qty 3

## 2014-02-04 MED ORDER — SODIUM CHLORIDE 0.9 % IJ SOLN
3.0000 mL | Freq: Two times a day (BID) | INTRAMUSCULAR | Status: DC
  Administered 2014-02-04: 3 mL via INTRAVENOUS

## 2014-02-04 MED ORDER — INSULIN ASPART 100 UNIT/ML ~~LOC~~ SOLN
0.0000 [IU] | SUBCUTANEOUS | Status: DC
  Administered 2014-02-05: 11 [IU] via SUBCUTANEOUS
  Administered 2014-02-05: 15 [IU] via SUBCUTANEOUS
  Administered 2014-02-05: 11 [IU] via SUBCUTANEOUS

## 2014-02-04 MED ORDER — MIDAZOLAM HCL 2 MG/2ML IJ SOLN
INTRAMUSCULAR | Status: AC
  Filled 2014-02-04: qty 2

## 2014-02-04 MED ORDER — ROCURONIUM BROMIDE 100 MG/10ML IV SOLN
INTRAVENOUS | Status: DC | PRN
  Administered 2014-02-04: 30 mg via INTRAVENOUS

## 2014-02-04 MED ORDER — HYDRALAZINE HCL 20 MG/ML IJ SOLN
INTRAMUSCULAR | Status: DC | PRN
  Administered 2014-02-04: 2 mg via INTRAVENOUS

## 2014-02-04 MED ORDER — DOCUSATE SODIUM 100 MG PO CAPS: 100.0000 mg | ORAL_CAPSULE | Freq: Two times a day (BID) | ORAL | Status: DC

## 2014-02-04 MED ORDER — INSULIN GLARGINE 100 UNITS/ML SOLOSTAR PEN: 50.0000 [IU] | PEN_INJECTOR | Freq: Two times a day (BID) | SUBCUTANEOUS | Status: DC

## 2014-02-04 MED ORDER — INSULIN ASPART 100 UNIT/ML ~~LOC~~ SOLN
0.0000 [IU] | Freq: Every day | SUBCUTANEOUS | Status: DC
  Administered 2014-02-04: 5 [IU] via SUBCUTANEOUS

## 2014-02-04 MED ORDER — POLYETHYLENE GLYCOL 3350 17 G PO PACK: 17.0000 g | PACK | Freq: Every day | ORAL | Status: DC | PRN

## 2014-02-04 MED ORDER — GLYCOPYRROLATE 0.2 MG/ML IJ SOLN
INTRAMUSCULAR | Status: DC | PRN
  Administered 2014-02-04: 0.4 mg via INTRAVENOUS

## 2014-02-04 MED ORDER — MUPIROCIN 2 % EX OINT
1.0000 "application " | TOPICAL_OINTMENT | Freq: Once | CUTANEOUS | Status: AC
  Administered 2014-02-04: 1 via TOPICAL
  Filled 2014-02-04: qty 22

## 2014-02-04 MED ORDER — HYDROCHLOROTHIAZIDE 12.5 MG PO CAPS
12.5000 mg | ORAL_CAPSULE | Freq: Every day | ORAL | Status: DC
  Administered 2014-02-04 – 2014-02-05 (×2): 12.5 mg via ORAL
  Filled 2014-02-04 (×2): qty 1

## 2014-02-04 MED ORDER — ONDANSETRON HCL 4 MG/2ML IJ SOLN
INTRAMUSCULAR | Status: AC
  Filled 2014-02-04: qty 2

## 2014-02-04 MED ORDER — METOCLOPRAMIDE HCL 5 MG/ML IJ SOLN
INTRAMUSCULAR | Status: DC | PRN
  Administered 2014-02-04: 5 mg via INTRAVENOUS

## 2014-02-04 MED ORDER — IBUPROFEN 600 MG PO TABS
600.0000 mg | ORAL_TABLET | ORAL | Status: DC | PRN
  Filled 2014-02-04: qty 1

## 2014-02-04 MED ORDER — MORPHINE SULFATE ER 20 MG PO CP24: 20.0000 mg | ORAL_CAPSULE | Freq: Every day | ORAL | Status: DC

## 2014-02-04 MED ORDER — ASPIRIN EC 81 MG PO TBEC
81.0000 mg | DELAYED_RELEASE_TABLET | Freq: Every day | ORAL | Status: DC
  Administered 2014-02-05: 81 mg via ORAL
  Filled 2014-02-04: qty 1

## 2014-02-04 MED ORDER — INSULIN ASPART 100 UNIT/ML ~~LOC~~ SOLN: 0.0000 [IU] | Freq: Three times a day (TID) | SUBCUTANEOUS | Status: DC

## 2014-02-04 MED ORDER — 0.9 % SODIUM CHLORIDE (POUR BTL) OPTIME
TOPICAL | Status: DC | PRN
  Administered 2014-02-04: 1000 mL

## 2014-02-04 MED ORDER — SCOPOLAMINE 1 MG/3DAYS TD PT72
1.0000 | MEDICATED_PATCH | TRANSDERMAL | Status: DC
  Administered 2014-02-04: 1.5 mg via TRANSDERMAL

## 2014-02-04 MED ORDER — ACETAMINOPHEN 325 MG PO TABS: 650.0000 mg | ORAL_TABLET | ORAL | Status: DC | PRN

## 2014-02-04 MED ORDER — NEOSTIGMINE METHYLSULFATE 10 MG/10ML IV SOLN
INTRAVENOUS | Status: DC | PRN
  Administered 2014-02-04: 3 mg via INTRAVENOUS

## 2014-02-04 MED ORDER — LACTATED RINGERS IV SOLN
INTRAVENOUS | Status: DC | PRN
  Administered 2014-02-04 (×2): via INTRAVENOUS

## 2014-02-04 MED ORDER — NITROGLYCERIN 0.4 MG SL SUBL: 0.4000 mg | SUBLINGUAL_TABLET | SUBLINGUAL | Status: DC | PRN

## 2014-02-04 MED ORDER — INSULIN ASPART 100 UNIT/ML FLEXPEN: 15.0000 [IU] | PEN_INJECTOR | Freq: Three times a day (TID) | SUBCUTANEOUS | Status: DC

## 2014-02-04 MED ORDER — BISACODYL 10 MG RE SUPP: 10.0000 mg | Freq: Every day | RECTAL | Status: DC | PRN

## 2014-02-04 MED ORDER — METHOCARBAMOL 1000 MG/10ML IJ SOLN
500.0000 mg | Freq: Four times a day (QID) | INTRAVENOUS | Status: DC | PRN
  Filled 2014-02-04: qty 5

## 2014-02-04 MED ORDER — VITAMIN D 50 MCG (2000 UT) PO CAPS: 4000.0000 [IU] | ORAL_CAPSULE | Freq: Every day | ORAL | Status: DC

## 2014-02-04 MED ORDER — INSULIN ASPART 100 UNIT/ML ~~LOC~~ SOLN: 0.0000 [IU] | SUBCUTANEOUS | Status: DC

## 2014-02-04 MED ORDER — POLYETHYLENE GLYCOL 3350 17 G PO PACK
17.0000 g | PACK | Freq: Every day | ORAL | Status: DC | PRN
  Filled 2014-02-04: qty 1

## 2014-02-04 MED ORDER — DIPHENHYDRAMINE HCL 50 MG/ML IJ SOLN
INTRAMUSCULAR | Status: AC
  Filled 2014-02-04: qty 1

## 2014-02-04 MED ORDER — OXYCODONE-ACETAMINOPHEN 5-325 MG PO TABS
1.0000 | ORAL_TABLET | ORAL | Status: DC | PRN
  Administered 2014-02-04 – 2014-02-05 (×2): 1 via ORAL
  Filled 2014-02-04 (×2): qty 1

## 2014-02-04 SURGICAL SUPPLY — 60 items
0.25% BUPIVICAINE WITH EPI, 30ML IMPLANT
ADH SKN CLS APL DERMABOND .7 (GAUZE/BANDAGES/DRESSINGS) ×1
BUR ROUND FLUTED 4 SOFT TCH (BURR) ×1 IMPLANT
CANISTER SUCTION WELLS/JOHNSON (MISCELLANEOUS) ×1 IMPLANT
CORDS BIPOLAR (ELECTRODE) ×1 IMPLANT
DERMABOND ADVANCED (GAUZE/BANDAGES/DRESSINGS) ×1
DERMABOND ADVANCED .7 DNX12 (GAUZE/BANDAGES/DRESSINGS) ×1 IMPLANT
DRAPE INCISE IOBAN 66X45 STRL (DRAPES) IMPLANT
DRAPE MICROSCOPE LEICA (MISCELLANEOUS) ×2 IMPLANT
DRAPE POUCH INSTRU U-SHP 10X18 (DRAPES) ×1 IMPLANT
DRAPE PROXIMA HALF (DRAPES) IMPLANT
DRAPE SURG 17X23 STRL (DRAPES) ×8 IMPLANT
DRSG MEPILEX BORDER 4X4 (GAUZE/BANDAGES/DRESSINGS) ×2 IMPLANT
DRSG MEPILEX BORDER 4X8 (GAUZE/BANDAGES/DRESSINGS) IMPLANT
DURAPREP 26ML APPLICATOR (WOUND CARE) ×2 IMPLANT
ELECT REM PT RETURN 9FT ADLT (ELECTROSURGICAL) ×2
ELECTRODE REM PT RTRN 9FT ADLT (ELECTROSURGICAL) ×1 IMPLANT
EVACUATOR 1/8 PVC DRAIN (DRAIN) ×1 IMPLANT
GLOVE BIOGEL PI IND STRL 7.0 (GLOVE) IMPLANT
GLOVE BIOGEL PI IND STRL 7.5 (GLOVE) ×1 IMPLANT
GLOVE BIOGEL PI INDICATOR 7.0 (GLOVE) ×1
GLOVE BIOGEL PI INDICATOR 7.5 (GLOVE) ×2
GLOVE ECLIPSE 7.0 STRL STRAW (GLOVE) ×1 IMPLANT
GLOVE ECLIPSE 9.0 STRL (GLOVE) ×1 IMPLANT
GLOVE SS PI 9.0 STRL (GLOVE) ×1 IMPLANT
GLOVE SURG 8.5 LATEX PF (GLOVE) ×1 IMPLANT
GLOVE SURG SS PI 6.5 STRL IVOR (GLOVE) ×1 IMPLANT
GLOVE SURG SS PI 7.0 STRL IVOR (GLOVE) ×3 IMPLANT
GLOVE SURG SS PI 8.5 STRL IVOR (GLOVE) ×1
GLOVE SURG SS PI 8.5 STRL STRW (GLOVE) IMPLANT
GOWN STRL REUS W/ TWL LRG LVL3 (GOWN DISPOSABLE) ×2 IMPLANT
GOWN STRL REUS W/TWL 2XL LVL3 (GOWN DISPOSABLE) ×2 IMPLANT
GOWN STRL REUS W/TWL LRG LVL3 (GOWN DISPOSABLE) ×6
KIT BASIN OR (CUSTOM PROCEDURE TRAY) ×2 IMPLANT
KIT ROOM TURNOVER OR (KITS) ×2 IMPLANT
MANIFOLD NEPTUNE II (INSTRUMENTS) ×1 IMPLANT
NDL SPNL 18GX3.5 QUINCKE PK (NEEDLE) ×2 IMPLANT
NEEDLE 22X1 1/2 (OR ONLY) (NEEDLE) ×2 IMPLANT
NEEDLE SPNL 18GX3.5 QUINCKE PK (NEEDLE) ×4 IMPLANT
NS IRRIG 1000ML POUR BTL (IV SOLUTION) ×2 IMPLANT
PACK LAMINECTOMY ORTHO (CUSTOM PROCEDURE TRAY) ×2 IMPLANT
PAD ARMBOARD 7.5X6 YLW CONV (MISCELLANEOUS) ×4 IMPLANT
PATTIES SURGICAL .5 X.5 (GAUZE/BANDAGES/DRESSINGS) IMPLANT
PATTIES SURGICAL .75X.75 (GAUZE/BANDAGES/DRESSINGS) IMPLANT
PATTIES SURGICAL 1X1 (DISPOSABLE) IMPLANT
SPECIMEN JAR SMALL (MISCELLANEOUS) ×1 IMPLANT
SPONGE LAP 4X18 X RAY DECT (DISPOSABLE) IMPLANT
SPONGE SURGIFOAM ABS GEL 100 (HEMOSTASIS) ×1 IMPLANT
SUT VIC AB 0 CT1 27 (SUTURE)
SUT VIC AB 0 CT1 27XBRD ANBCTR (SUTURE) ×2 IMPLANT
SUT VIC AB 1 CT1 27 (SUTURE) ×2
SUT VIC AB 1 CT1 27XBRD ANBCTR (SUTURE) ×2 IMPLANT
SUT VIC AB 2-0 CT1 27 (SUTURE) ×2
SUT VIC AB 2-0 CT1 TAPERPNT 27 (SUTURE) IMPLANT
SUT VICRYL 0 UR6 27IN ABS (SUTURE) ×1 IMPLANT
SUT VICRYL 4-0 PS2 18IN ABS (SUTURE) ×1 IMPLANT
SYR CONTROL 10ML LL (SYRINGE) ×2 IMPLANT
TOWEL OR 17X24 6PK STRL BLUE (TOWEL DISPOSABLE) ×2 IMPLANT
TOWEL OR 17X26 10 PK STRL BLUE (TOWEL DISPOSABLE) ×2 IMPLANT
YANKAUER SUCT BULB TIP NO VENT (SUCTIONS) ×1 IMPLANT

## 2014-02-04 NOTE — Anesthesia Preprocedure Evaluation (Signed)
Anesthesia Evaluation  Patient identified by MRN, date of birth, ID band Patient awake    Reviewed: Allergy & Precautions, H&P , NPO status , Patient's Chart, lab work & pertinent test results  History of Anesthesia Complications (+) PONV and history of anesthetic complications  Airway Mallampati: II  TM Distance: >3 FB Neck ROM: Full    Dental no notable dental hx. (+) Teeth Intact, Dental Advisory Given   Pulmonary neg pulmonary ROS,  breath sounds clear to auscultation  Pulmonary exam normal       Cardiovascular hypertension, On Medications Rhythm:Regular Rate:Normal     Neuro/Psych  Headaches, negative neurological ROS  negative psych ROS   GI/Hepatic Neg liver ROS, hiatal hernia, GERD-  Medicated and Controlled,  Endo/Other  diabetes, Type 1, Insulin DependentHypothyroidism Morbid obesity  Renal/GU negative Renal ROS  negative genitourinary   Musculoskeletal   Abdominal   Peds  Hematology negative hematology ROS (+) anemia ,   Anesthesia Other Findings   Reproductive/Obstetrics negative OB ROS                             Anesthesia Physical Anesthesia Plan  ASA: III  Anesthesia Plan: General   Post-op Pain Management:    Induction: Intravenous  Airway Management Planned: Oral ETT  Additional Equipment:   Intra-op Plan:   Post-operative Plan: Extubation in OR  Informed Consent: I have reviewed the patients History and Physical, chart, labs and discussed the procedure including the risks, benefits and alternatives for the proposed anesthesia with the patient or authorized representative who has indicated his/her understanding and acceptance.   Dental advisory given  Plan Discussed with: CRNA  Anesthesia Plan Comments:         Anesthesia Quick Evaluation

## 2014-02-04 NOTE — Progress Notes (Signed)
ANTIBIOTIC CONSULT NOTE - INITIAL  Pharmacy Consult for vancomycin Indication: surgical prophylaxis  Allergies  Allergen Reactions  . Dilaudid [Hydromorphone Hcl] Nausea And Vomiting  . Ampicillin Rash    REACTION: Rash and welts on her hands when she took it in the hospital  . Latex Itching, Dermatitis and Rash    REACTION: Patient had a reaction to tape after surgery and they told her she was allergic to Latex 03/20/2012 "if it's on too long it will break me out"  . Penicillins Rash    Patient Measurements:    Weight: 92 kg  Vital Signs: Temp: 98.5 F (36.9 C) (10/27 1829) Temp Source: Oral (10/27 1159) BP: 164/71 mmHg (10/27 1829) Pulse Rate: 67 (10/27 1829) Intake/Output from previous day:   Intake/Output from this shift:    Labs:  Recent Labs  02/03/14 1430  WBC 5.6  HGB 13.6  PLT 261  CREATININE 0.54   The CrCl is unknown because both a height and weight (above a minimum accepted value) are required for this calculation. No results found for this basename: VANCOTROUGH, Corlis Leak, VANCORANDOM, GENTTROUGH, GENTPEAK, GENTRANDOM, TOBRATROUGH, TOBRAPEAK, TOBRARND, AMIKACINPEAK, AMIKACINTROU, AMIKACIN,  in the last 72 hours   Microbiology: Recent Results (from the past 720 hour(s))  SURGICAL PCR SCREEN     Status: Abnormal   Collection Time    02/03/14  2:30 PM      Result Value Ref Range Status   MRSA, PCR NEGATIVE  NEGATIVE Final   Staphylococcus aureus POSITIVE (*) NEGATIVE Final   Comment:            The Xpert SA Assay (FDA     approved for NASAL specimens     in patients over 65 years of age),     is one component of     a comprehensive surveillance     program.  Test performance has     been validated by [employer] for patients greater     than or equal to 68 year old.     It is not intended     to diagnose infection nor to     guide or monitor treatment.    Medical History: Past Medical History  Diagnosis Date  . GERD (gastroesophageal  reflux disease)   . Hypothyroidism   . Peripheral neuropathy   . Cardiomyopathy   . Chest pain 06/2005    Hospitalized, dystolic dysfunction  . Sciatica   . Type II diabetes mellitus   . PONV (postoperative nausea and vomiting)   . H/O hiatal hernia   . Migraines     "it's been a long time since I've had one" (03/20/2012)  . Osteoarthritis   . Arthritis     "all over; mostly in my back" (03/20/2012)  . Chronic lower back pain     "have 2 herniated discs; going to have to have a fusion" (03/20/2012)  . Kidney stones     "never had OR" (03/20/2012)  . Breast cancer     "right" (03/20/2012)  . Family history of anesthesia complication     "father has nausea and vomiting"  . Irritable bowel syndrome   . Hypertension     "patient states never had HTN, takes Hyzaar for heart  . Anginal pain 03/19/2012    saw Dr. Cathie Olden .. she thinks its more related to stomach issues  . History of right mastectomy     Medications:  Prescriptions prior to admission  Medication Sig Dispense  Refill  . acetaminophen (TYLENOL) 325 MG tablet Take 650 mg by mouth every 6 (six) hours as needed for moderate pain.      [name] Kitchen aspirin 81 MG tablet Take 81 mg by mouth daily.      . Cholecalciferol (VITAMIN D) 2000 UNITS CAPS Take 4,000 Units by mouth daily.      [name] Kitchen docusate sodium 100 MG CAPS Take 100 mg by mouth 2 (two) times daily.  10 capsule    . gabapentin (NEURONTIN) 300 MG capsule Take 1 capsule (300 mg total) by mouth 3 (three) times daily.  90 capsule  3  . ibuprofen (ADVIL,MOTRIN) 200 MG tablet Take 600 mg by mouth every 4 (four) hours as needed for moderate pain.      [name] Kitchen insulin aspart (NOVOLOG) 100 UNIT/ML FlexPen Inject 15 Units into the skin 3 (three) times daily with meals.  5 pen  11  . insulin glargine (LANTUS) 100 unit/mL SOPN Inject 50 Units into the skin 2 (two) times daily.      . Insulin Pen Needle (PEN NEEDLES 5/16") 30G X 8 MM MISC Use to inject insulin 5x daily.  200 each  11  .  levothyroxine (SYNTHROID, LEVOTHROID) 100 MCG tablet Take 1 tablet (100 mcg total) by mouth daily.  30 tablet  11  . losartan-hydrochlorothiazide (HYZAAR) 50-12.5 MG per tablet Take 1 tablet by mouth daily.  30 tablet  11  . metFORMIN (GLUCOPHAGE) 850 MG tablet Take 1 tablet (850 mg total) by mouth 2 (two) times daily with a meal.  60 tablet  3  . morphine (KADIAN) 20 MG 24 hr capsule Take 20 mg by mouth daily.       [name] Kitchen oxyCODONE (OXY IR/ROXICODONE) 5 MG immediate release tablet Take 1 tablet (5 mg total) by mouth every 4 (four) hours as needed for severe pain.  18 tablet  0  . polyethylene glycol (MIRALAX / GLYCOLAX) packet Take 17 g by mouth daily as needed (constipation).       . nitroGLYCERIN (NITROSTAT) 0.4 MG SL tablet Place 0.4 mg under the tongue every 5 (five) minutes as needed for chest pain.        Assessment: 64 yo F s/p spinal surgery.  Pharmacy consulted to dose vancomycin for surgical prophylaxis until drain removed.   Wt 92 kg, creat 0.54. Wbc 5.6, AF. Vanc 1 gm given at 1400.   Goal of Therapy:  Vancomycin trough level 10-15 mcg/ml  Plan:  -vancomycin 1 gm IV q12h until drain removed -anticipate short course of therapy Eudelia Bunch, Pharm.D. BP:[phone] 02/04/2014 7:06 PM

## 2014-02-04 NOTE — Progress Notes (Signed)
Pt says no more tingling in lower extremities after PAS off for awhile.

## 2014-02-04 NOTE — Plan of Care (Signed)
Problem: Consults Goal: Diagnosis - Spinal Surgery Outcome: Completed/Met Date Met:  02/04/14 Microdiscectomy

## 2014-02-04 NOTE — Op Note (Signed)
02/04/2014  4:22 PM  PATIENT:  [name]  63 y.o. female  MRN: [id number]  OPERATIVE REPORT  PRE-OPERATIVE DIAGNOSIS:  Right L2-3 Herniated Nucleus Pulposus above L3 to L5 Fusion  POST-OPERATIVE DIAGNOSIS:  Right L2-3 Herniated Nucleus Pulposus above L3 to L5 Fusion, Central stenosis L2-3, right extruded HNP. Right L3 nerve compression.  PROCEDURE:  Procedure(s): RIGHT L2-3 MICRODISCECTOMY, CENTRAL LAMINECTOMY L2-3     SURGEON:  Jessy Oto, MD     ASSISTANT:  Phillips Hay, PA-C  (Present throughout the entire procedure and necessary for completion of procedure in a timely manner)     ANESTHESIA:  General,supplemented with local anesthesia marcaine 1/2% 1:1 exparel 1.3%, total 20 cc. Dr. Marcie Bal.  EBL: 150cc.    COMPLICATIONS:  None.     DRAINS: Hemovac x 1 right lumbar.  PROCEDURE:The patient was met in the holding area, and the appropriate right Lumbar level L2-3 identified and marked with "x" and my initials.The patient was then transported to OR and was placed under general anesthesia without difficulty. The patient received appropriate preoperative antibiotic prophylaxis. The patient after intubation atraumatically was transferred to the operating room table, prone position, Wilson frame, sliding OR table. All pressure points were well padded. The arms in 90-90 well-padded at the elbows. Standard prep with iodophor lower dorsal spine to the mid sacral segment. Draped in the usual manner clear Vi-Drape was used. Time-out procedure was called and correct. 2x 18-gauge spinal needle was then inserted at the expected L2-3 level. C-arm was draped sterilely to the field and used to identify the spinal needles positions. The lower needle was at the lower aspect of the pedicle of L3. Skin superior to this was then infiltrated with Marcaine half percent 1:1 Exparel 1.3% total of 10 cc used. An incision approximately an inch inch and a half in length was then made through skin and  subcutaneous layers in line with the left side of the expected midline just superior to the spinal needle entry point. An incision made into the midline lumbosacral fascia approximately an inch and 1/2 in length .   Smallest dilator was then introduced into the incision site and used to carefully form subperiosteal dissection of the paralumbar muscles off of the posterior lamina of the expected L2-3 level. Intraop cross table lateral xray obtained with clamp on the L2 spinous process verified on the xray. The depth measured off of the dilators at about 70 mm and 70 mm retractors then placed on the scaffolding for the MIS equipment and guided over dilators down to and docking on the posterior aspect of the lamina at the expected L2-3 level. This was sterilely attached to the articulating arm and it's up right which had been attached the OR table sterilely.With the dilators and the retractors at the appropriate level L2-3 the periosteal attachments at L2 level were detached with cobb elevators and the Spinous process of L2 resected centrally over inferior 1/2. High speed burr used to thin the base of the spinous process and lamina bilaterally. Ligamentum flavum noted in the midline.  The operating room microscope sterilely draped brought into the field. Under the operating room microscope, the L2-3  carefully debrided the bone on either lateral aspect of the central laminectomy and high-speed bur used to drill the medial aspect of the inferior articular processes of L2 approximately 20% bilaterally.2 mm Kerrison then used to enter the spinal canal over the superior aspect of the right L2 ligamentum flavum carefully using the Kerrison to  debris the attachment as a curet. Foraminotomy was then performed over the right L3 nerve root. The medial 10% superior articular process of L3 then resected using 2 mm Kerrison. This allowed for identification of the thecal sac. Penfield 4 was then used to carefully mobilize the  thecal sac medially and the L3 nerve root identified within the lateral recess flattened over the posterior aspect of the protruded disc. Carefully the lateral aspect of the L3 nerve root was identified and a Penfield 4 was used to mobilize the nerve medially such that the protruded disc was visible with microscope. Using a Penfield 4 for retraction and a 15 blade scalpel was used to incise the posterior longitudinal ligament within the lateral recess on the right side longitudinally. Disc material was removed using micropituitary rongeurs and nerve hook nerve root and then more easily able to be mobilized medially and retracted using a Derricho retractor. Further foraminotomies was performed over the L2 nerve root the nerve root was noted to be free without further compression. The nerve root able to be retracted along the medial aspect of the L3 pedicle and disc material found to be subligamentous at this level was further resected current pituitary rongeurs.  Ligamentum flavum was further debrided superiorly to the level L2-3 disc. Had a moderate amount of further resection of the L2 lamina inferiorly and medially was performed. With this then the disc space at L2-3 was easily visualized and entry into the disc at the right sided disc herniation was possible using a Penfield 4 intraoperative Micropituitary was used to further debride this material superficially from the posterior aspect of the intervertebral disc is posterior lateral aspect of the disc. Small amount of further disc material was found subligamentous extending laterally from the disc this was removed using micropituitary rongeurs into the right L2 neuroforamen. Ligamentum flavum was debrided and lateral recess along the medial aspect L2-3 facet no further decompression was necessary. Ball tip nerve probe was then able to carefully palpate the neuroforamen for L2 and L3 finding these to be well decompressed. Bleeding was then controlled using  thrombin-soaked Gelfoam small cottonoids. Small amount of bleeding within the soft tissue mass the laminotomy area was controlled using bipolar electrocautery. Irrigation was carried out using copious amounts of irrigant solution. All Gelfoam were then removed. Minimal active bleeding present at the time closure, a medium hemovac drain used to drain the incision site. All instruments sponge counts were correct traction system was then carefully removed carefully rotating retractors with this withdrawal and only bipolar electrocautery of any small bleeders. Lumbodorsal fascia was then carefully approximated with interrupted 0 Vicryl sutures, UR 6 needle deep subcutaneous layers were approximated with interrupted 0 Vicryl sutures on UR 6 the appear subcutaneous layers approximated with interrupted 2-0 Vicryl sutures and the skin closed with a running subcutaneous stitch of 4-0 Vicryl. Dermabond was applied allowed to dry and then Mepilex bandage applied. Patient was then carefully returned to supine position on a stretcher, reactivated and extubated. He was then returned to recovery room in satisfactory condition. Phillips Hay PA-C perform the duties of assistant surgeon during this case. She was present from the beginning of the case to the end of the case assisting in transfer the patient from his stretcher to the OR table and back to the stretcher at the end of the case. Assisted in careful retraction and suction of the laminectomy site delicate neural structures operating under the operating room microscope. She performed closure of the incision from the  fascia to the skin applying the dressing.     NITKA,JAMES E  02/04/2014, 4:22 PM

## 2014-02-04 NOTE — Discharge Instructions (Signed)
    No lifting greater than 10 lbs. Avoid bending, stooping and twisting. Walk in house for first week them may start to get out slowly increasing distance up to one mile by 3 weeks post op. Keep incision dry for 3 days, may use tegaderm or similar water impervious dressing.  

## 2014-02-04 NOTE — Transfer of Care (Signed)
Immediate Anesthesia Transfer of Care Note  [name]  Procedure(s) Performed: Procedure(s): RIGHT L2-3 MICRODISCECTOMY  (N/A)  Patient Location: PACU  Anesthesia Type:General  Level of Consciousness: awake, alert  and oriented  Airway & Oxygen Therapy: Patient Spontanous Breathing and Patient connected to nasal cannula oxygen  Post-op Assessment: Report given to PACU RN and Post -op Vital signs reviewed and stable  Post vital signs: Reviewed and stable  Complications: No apparent anesthesia complications

## 2014-02-04 NOTE — Interval H&P Note (Signed)
History and Physical Interval Note:  02/04/2014 12:25 PM  [name]  has presented today for surgery, with the diagnosis of Right L2-3 Herniated Nucleus Pulposus above L3 to L5 Fusion  The various methods of treatment have been discussed with the patient and family. After consideration of risks, benefits and other options for treatment, the patient has consented to  Procedure(s): RIGHT L2-3 MICRODISCECTOMY  (N/A) as a surgical intervention .  The patient's history has been reviewed, patient examined, no change in status, stable for surgery.  I have reviewed the patient's chart and labs.  Questions were answered to the patient's satisfaction.     [name] E

## 2014-02-04 NOTE — Brief Op Note (Signed)
02/04/2014  4:17 PM  PATIENT:  [name]  63 y.o. female  PRE-OPERATIVE DIAGNOSIS:  Right L2-3 Herniated Nucleus Pulposus above L3 to L5 Fusion  POST-OPERATIVE DIAGNOSIS:  Right L2-3 Herniated Nucleus Pulposus above L3 to L5 Fusion, Central stenosis L2-3.  PROCEDURE:  Procedure(s): RIGHT L2-3 MICRODISCECTOMY  (N/A)  SURGEON:  Surgeon(s) and Role:    * Jessy Oto, MD - Primary  PHYSICIAN ASSISTANT:Sheila Lynnae Sandhoff, PA-C   ANESTHESIA:   local and general, Dr. Marcie Bal  EBL:  Total I/O In: 1000 [I.V.:1000] Out: 250 [Blood:250]  BLOOD ADMINISTERED:none  DRAINS: (One) Hemovact drain(s) in the right lumbar with  Suction Open   LOCAL MEDICATIONS USED:  MARCAINE1/2 1:1 EXPAREL 1.3%   , Amount: 20 ml   SPECIMEN:  No Specimen  DISPOSITION OF SPECIMEN:  N/A  COUNTS:  YES  TOURNIQUET:  * No tourniquets in log *  DICTATION: .Dragon Dictation  PLAN OF CARE: Admit for overnight observation  PATIENT DISPOSITION:  PACU - hemodynamically stable.   Delay start of Pharmacological VTE agent (>24hrs) due to surgical blood loss or risk of bleeding: yes

## 2014-02-04 NOTE — Anesthesia Postprocedure Evaluation (Signed)
  Anesthesia Post-op Note  [name]  Procedure(s) Performed: Procedure(s): RIGHT L2-3 MICRODISCECTOMY  (N/A)  Patient Location: PACU  Anesthesia Type:General  Level of Consciousness: awake and alert   Airway and Oxygen Therapy: Patient Spontanous Breathing  Post-op Pain: none  Post-op Assessment: Post-op Vital signs reviewed, Patient's Cardiovascular Status Stable and Respiratory Function Stable  Post-op Vital Signs: Reviewed  Filed Vitals:   02/04/14 1730  BP: 163/73  Pulse: 76  Temp:   Resp: 20    Complications: No apparent anesthesia complications

## 2014-02-05 DIAGNOSIS — M5116 Intervertebral disc disorders with radiculopathy, lumbar region: Secondary | ICD-10-CM | POA: Diagnosis not present

## 2014-02-05 LAB — CBC
HEMATOCRIT: 38.5 % (ref 36.0–46.0)
Hemoglobin: 12.5 g/dL (ref 12.0–15.0)
MCH: 29.1 pg (ref 26.0–34.0)
MCHC: 32.5 g/dL (ref 30.0–36.0)
MCV: 89.5 fL (ref 78.0–100.0)
PLATELETS: 259 10*3/uL (ref 150–400)
RBC: 4.3 MIL/uL (ref 3.87–5.11)
RDW: 13.9 % (ref 11.5–15.5)
WBC: 9.7 10*3/uL (ref 4.0–10.5)

## 2014-02-05 LAB — GLUCOSE, CAPILLARY
Glucose-Capillary: 344 mg/dL — ABNORMAL HIGH (ref 70–99)
Glucose-Capillary: 294 mg/dL — ABNORMAL HIGH (ref 70–99)
Glucose-Capillary: 257 mg/dL — ABNORMAL HIGH (ref 70–99)

## 2014-02-05 MED ORDER — OXYCODONE-ACETAMINOPHEN 5-325 MG PO TABS: 1.0000 | ORAL_TABLET | ORAL | Status: DC | PRN

## 2014-02-05 MED ORDER — METHOCARBAMOL 500 MG PO TABS: 500.0000 mg | ORAL_TABLET | Freq: Four times a day (QID) | ORAL | Status: DC | PRN

## 2014-02-05 NOTE — Progress Notes (Signed)
Patient alert and oriented, mae's well, voiding adequate amount of urine, swallowing without difficulty, no c/o pain. Patient discharged home with family. Script and discharged instructions given to patient. Patient and family stated understanding of d/c instructions given and has an appointment with MD. Tomma Rakers RN

## 2014-02-05 NOTE — Progress Notes (Signed)
OT Cancellation Note  Patient Details Name: [name] MRN: [id number] DOB: 1950/11/20   Cancelled Treatment:    Reason Eval/Treat Not Completed: OT screened, no needs identified, will sign off. Spoke with pt and she has no OT concerns and has had previous back surgery.  Benito Mccreedy OTR/L [id number] 02/05/2014, 10:27 AM

## 2014-02-05 NOTE — Evaluation (Signed)
Physical Therapy Evaluation Patient Details Name: [name] MRN: [id number] DOB: [birth year] Today's Date: 02/05/2014   History of Present Illness  pt is 63 y.o. female presenting s/p R L2-3 microdiscectomy. Past hx of L4/L5 & L5/S1 fusion for spondylolisthesis, spinal stenosis and disk protrusion Over a year ago. Hx of hypothyroidism, peripheral neuropathy, cardiomyopathy, OA, DM, and HTN  Clinical Impression  Pt currently requires supervision with AD for transfers and ambulation and min guard for stairs with rails. Pt mod I with bed mobility. Pt does not require further acute skilled PT services due to current functional level. D/c plan discussed with pt, pt agreeable. Recommending home with 24 hr supervision at initial d/c.     Follow Up Recommendations Supervision/Assistance - 24 hour    Equipment Recommendations  None recommended by PT    Recommendations for Other Services       Precautions / Restrictions Precautions Precautions: Back Precaution Booklet Issued: Yes (comment) Precaution Comments: precautions reviewed prior to mobility      Mobility  Bed Mobility Overal bed mobility: Needs Assistance Bed Mobility: Rolling;Sidelying to Sit Rolling: Modified independent (Device/Increase time) Sidelying to sit: Modified independent (Device/Increase time)       General bed mobility comments: Extrat time required. No assistance needed. No cues for technique  Transfers Overall transfer level: Modified independent Equipment used: Rolling walker (2 wheeled) Transfers: Sit to/from Stand Sit to Stand: Supervision         General transfer comment: guarding provided initially, able to progress to supervision. No instability or LOB with standing. No reports of lightheadedness or dizziness with standing  Ambulation/Gait Ambulation/Gait assistance: Supervision Ambulation Distance (Feet): 360 Feet Assistive device: Rolling walker (2 wheeled) Gait Pattern/deviations:  Step-through pattern;Decreased stance time - right;Decreased step length - left;Decreased stride length Gait velocity: slow Gait velocity interpretation: Below normal speed for age/gender General Gait Details: Pt without difficulty using rolling walker, no instability or LOB. Pt able to amb 71' without RW using hand hold assist on R. Increased instability without use of RW, recommended that pt use RW for upright mobility until stronger.   Stairs Stairs: Yes Stairs assistance: Min guard Stair Management: One rail Left;Step to pattern;Wheelchair Number of Stairs: 3 General stair comments: Pt able to navigate stairs without LOB or instability. guarding for safety. Leads with L foot for ascend, leads with R foot for descend.  Wheelchair Mobility    Modified Rankin (Stroke Patients Only)       Balance Overall balance assessment: Needs assistance Sitting-balance support: Feet supported Sitting balance-Leahy Scale: Good     Standing balance support: During functional activity;Bilateral upper extremity supported Standing balance-Leahy Scale: Fair                               Pertinent Vitals/Pain Pain Assessment: 0-10 Pain Score: 2  Pain Location: R LE Pain Descriptors / Indicators: Aching Pain Intervention(s): Monitored during session;Limited activity within patient's tolerance    Home Living Family/patient expects to be discharged to:: Private residence Living Arrangements: Spouse/significant other Available Help at Discharge: Family;Available 24 hours/day Type of Home: House Home Access: Stairs to enter Entrance Stairs-Rails: Left Entrance Stairs-Number of Steps: 3 Home Layout: One level Home Equipment: Walker - 2 wheels;Bedside commode;Shower seat      Prior Function Level of Independence: Needs assistance   Gait / Transfers Assistance Needed: unable to stand or ambulate without assistance  ADL's / Homemaking Assistance Needed: assistance required for  cooking and community ADLs. Pt not driving prior to surgery        Hand Dominance        Extremity/Trunk Assessment   Upper Extremity Assessment: Overall WFL for tasks assessed           Lower Extremity Assessment: Overall WFL for tasks assessed      Cervical / Trunk Assessment: Normal  Communication   Communication: No difficulties  Cognition Arousal/Alertness: Awake/alert Behavior During Therapy: WFL for tasks assessed/performed Overall Cognitive Status: Within Functional Limits for tasks assessed                      General Comments General comments (skin integrity, edema, etc.): Pt reports wanting to get away from using an AD with mobility at some point in the future. Pt currently not ready to amb without assist or use of AD, but discussed that this is something she could work on with HHPT, pt receptive. POC and d/c plan discussed with pt. Plan is d/c home with 24 hr suprvision. Pt interested in eventually not having to use an AD, discussed possibility of HHPT if feels like mobiltiy is not progressing on her own with mobility. Do not feel HHPT is currently needed.  Pt reports some pain at incision during ambulation, at worst 2/10. Pt educated on precautions, pt receptive. Pt already knowlegdable about use of RW, no cues or education required.    Exercises        Assessment/Plan    PT Assessment Patent does not need any further PT services  PT Diagnosis Difficulty walking   PT Problem List Decreased strength;Decreased range of motion;Decreased activity tolerance;Decreased balance;Decreased mobility;Decreased knowledge of use of DME  PT Treatment Interventions DME instruction;Gait training;Functional mobility training;Therapeutic activities;Therapeutic exercise;Balance training;Patient/family education   PT Goals (Current goals can be found in the Care Plan section) Acute Rehab PT Goals Patient Stated Goal: to go home PT Goal Formulation: All assessment and  education complete, DC therapy    Frequency     Barriers to discharge        Co-evaluation               End of Session Equipment Utilized During Treatment: Gait belt Activity Tolerance: Patient tolerated treatment well Patient left: in chair;with call bell/phone within reach Nurse Communication: Mobility status;Precautions         Time: [id number] PT Time Calculation (min): 15 min   Charges:         PT G Codes:          Jennalee Greaves 02/05/2014, 9:35 AM Levonne Hubert, SPT

## 2014-02-05 NOTE — Evaluation (Signed)
Assessment reviewed and discussed, at this time Do not feel that HHPT is a necessity as patient will have adequate assist and is mobilizing well, anticipate mobility will continue to improve moving forward. No further acute needs. Alben Deeds, [place] DPT  [phone]

## 2014-02-05 NOTE — Progress Notes (Signed)
Patient ID: [name], female   DOB: Mar 20, 1951, 63 y.o.   MRN: [id number] Subjective: 1 Day Post-Op Procedure(s) (LRB): RIGHT L2-3 MICRODISCECTOMY  (N/A) Took only 2 tablets for pain, drain right lumbar came out this AM. Some weakness right hip flexor otherwise leg pain and knee strength much improved. Patient reports pain as mild.    Objective:   VITALS:  Temp:  [97 F (36.1 C)-99.3 F (37.4 C)] 98.6 F (37 C) (10/28 0424) Pulse Rate:  [60-92] 83 (10/28 0424) Resp:  [13-20] 20 (10/28 0424) BP: (134-186)/(61-80) 134/69 mmHg (10/28 0424) SpO2:  [94 %-100 %] 97 % (10/28 0424)  Neurologically intact ABD soft Neurovascular intact Sensation intact distally Intact pulses distally Dorsiflexion/Plantar flexion intact Incision: no drainage some moderate drainage from drain wound.   LABS  Recent Labs  02/03/14 1430 02/05/14 0622  HGB 13.6 12.5  WBC 5.6 9.7  PLT 261 259    Recent Labs  02/03/14 1430  NA 143  K 4.0  CL 105  CO2 25  BUN 14  CREATININE 0.54  GLUCOSE 180*   No results found for this basename: LABPT, INR,  in the last 72 hours   Assessment/Plan: 1 Day Post-Op Procedure(s) (LRB): RIGHT L2-3 MICRODISCECTOMY  (N/A)  Advance diet Up with therapy Discharge home without [facility] for PT if She does well with PT/OT today.  Alis Sawchuk E 02/05/2014, 7:47 AM

## 2014-02-07 ENCOUNTER — Encounter (HOSPITAL_COMMUNITY): Payer: Self-pay | Admitting: Specialist

## 2014-02-14 ENCOUNTER — Encounter: Payer: Self-pay | Admitting: Family Medicine

## 2014-02-14 ENCOUNTER — Ambulatory Visit
Admission: RE | Admit: 2014-02-14 | Discharge: 2014-02-14 | Disposition: A | Discharge status: Home / Self Care | Payer: Medicare HMO | Source: Ambulatory Visit | Attending: Family Medicine | Admitting: Family Medicine

## 2014-02-14 ENCOUNTER — Other Ambulatory Visit: Payer: Self-pay | Admitting: Family Medicine

## 2014-02-14 ENCOUNTER — Ambulatory Visit (INDEPENDENT_AMBULATORY_CARE_PROVIDER_SITE_OTHER): Payer: Commercial Managed Care - HMO | Admitting: Family Medicine

## 2014-02-14 VITALS — BP 130/76 | HR 64 | Temp 98.5°F | Resp 16 | Ht 64.0 in | Wt 201.0 lb

## 2014-02-14 DIAGNOSIS — R112 Nausea with vomiting, unspecified: Secondary | ICD-10-CM

## 2014-02-14 DIAGNOSIS — R1084 Generalized abdominal pain: Secondary | ICD-10-CM

## 2014-02-14 LAB — CBC W/MCH & 3 PART DIFF
HEMATOCRIT: 41.2 % (ref 36.0–46.0)
HEMOGLOBIN: 14.1 g/dL (ref 12.0–15.0)
LYMPHS PCT: 38 % (ref 12–46)
Lymphs Abs: 2.5 10*3/uL (ref 0.7–4.0)
MCH: 30.3 pg (ref 26.0–34.0)
MCHC: 34.2 g/dL (ref 30.0–36.0)
MCV: 88.6 fL (ref 78.0–100.0)
Neutro Abs: 3.6 10*3/uL (ref 1.7–7.7)
Neutrophils Relative %: 54 % (ref 43–77)
Platelets: 336 10*3/uL (ref 150–400)
RBC: 4.65 MIL/uL (ref 3.87–5.11)
RDW: 13.7 % (ref 11.5–15.5)
WBC mixed population %: 8 % (ref 3–18)
WBC: 6.6 10*3/uL (ref 4.0–10.5)
WBCMIX: 0.5 10*3/uL (ref 0.1–1.8)

## 2014-02-14 LAB — COMPREHENSIVE METABOLIC PANEL
ALK PHOS: 59 U/L (ref 39–117)
ALT: 11 U/L (ref 0–35)
AST: 15 U/L (ref 0–37)
Albumin: 4.1 g/dL (ref 3.5–5.2)
BILIRUBIN TOTAL: 0.5 mg/dL (ref 0.2–1.2)
BUN: 14 mg/dL (ref 6–23)
CO2: 26 mEq/L (ref 19–32)
CREATININE: 0.82 mg/dL (ref 0.50–1.10)
Calcium: 10 mg/dL (ref 8.4–10.5)
Chloride: 104 mEq/L (ref 96–112)
Glucose, Bld: 107 mg/dL — ABNORMAL HIGH (ref 70–99)
Potassium: 4 mEq/L (ref 3.5–5.3)
Sodium: 140 mEq/L (ref 135–145)
TOTAL PROTEIN: 7.7 g/dL (ref 6.0–8.3)

## 2014-02-14 LAB — LIPASE: LIPASE: 24 U/L (ref 0–75)

## 2014-02-14 MED ORDER — PROMETHAZINE HCL 25 MG/ML IJ SOLN
25.0000 mg | Freq: Once | INTRAMUSCULAR | Status: AC
  Administered 2014-02-14: 25 mg via INTRAMUSCULAR

## 2014-02-14 MED ORDER — PROMETHAZINE HCL 25 MG PO TABS: 25.0000 mg | ORAL_TABLET | Freq: Three times a day (TID) | ORAL | Status: DC | PRN

## 2014-02-14 MED ORDER — IOHEXOL 300 MG/ML  SOLN
100.0000 mL | Freq: Once | INTRAMUSCULAR | Status: AC | PRN
  Administered 2014-02-14: 100 mL via INTRAVENOUS

## 2014-02-14 NOTE — Patient Instructions (Signed)
Take nausea pills- Phenergan  CT scan to be done We will call with all results F/U pending results

## 2014-02-14 NOTE — Progress Notes (Signed)
Patient ID: [name], female   DOB: Mar 28, 1951, 63 y.o.   MRN: [id number]   Subjective:    Patient ID: [name], female    DOB: [birth year], 63 y.o.   MRN: [id number]  Patient presents for Stomach Issues patient here with abdominal pain nausea with vomiting worsening over the past 4 weeks. She is status post a recent back surgery. She has diabetes mellitus which is uncontrolled of an A1c at 10%. She states that she had the nausea and abdominal pains before she even had her back surgery they had to give her a lot of IV nausea medicines. Her pain is all over but mostly in the right lower quadrant. She has been constipated and has been taken an herbal laxative as well as me relax with minimal improvement in her pain. Her pain worse is in her lower abdomen when she eats she denies any blood in her bowel movements denies any bilious vomiting which she has not had in the past couple weeks but is severely nauseous every day. She does have a gallbladder.    Review Of Systems:  GEN- denies fatigue, fever, weight loss,weakness, recent illness HEENT- denies eye drainage, change in vision, nasal discharge, CVS- denies chest pain, palpitations RESP- denies SOB, cough, wheeze ABD- + N/V, change in stools, =abd pain GU- denies dysuria, hematuria, dribbling, incontinence MSK- denies joint pain, muscle aches, injury Neuro- denies headache, dizziness, syncope, seizure activity       Objective:    BP 130/76 mmHg  Pulse 64  Temp(Src) 98.5 F (36.9 C) (Oral)  Resp 16  Ht 5\' 4"  (1.626 m)  Wt 201 lb (91.173 kg)  BMI 34.48 kg/m2 GEN- NAD, alert and oriented x3 HEENT- PERRL, EOMI, non injected sclera, pink conjunctiva, MMM, oropharynx clear CVS- RRR, no murmur RESP-CTAB ABD-hypoactive BS heard in all 4 quadrants soft, TTP generalized, worse in RLQ, no rebound, +gaurding , ND EXT- No edema Pulses- Radial 2+        Assessment & Plan:      Problem List Items Addressed This Visit     None    Visit Diagnoses    Generalized abdominal pain    -  Primary    Pain worse in RLQ, rule out appendicitis vs colititis, pain out of proportion for just constipation, CBC in office normal, CMET pendi, given phenergan in office    Relevant Medications       promethazine (PHENERGAN)  tablet       promethazine (PHENERGAN) injection 25 mg (Completed)    Other Relevant Orders       Lipase       CBC w/MCH & 3 Part Diff (Completed)       Comprehensive metabolic panel       CT Abdomen Pelvis W Contrast    Non-intractable vomiting with nausea, vomiting of unspecified type        Relevant Medications       promethazine (PHENERGAN)  tablet       promethazine (PHENERGAN) injection 25 mg (Completed)    Other Relevant Orders       Lipase       CBC w/MCH & 3 Part Diff (Completed)       Comprehensive metabolic panel       CT Abdomen Pelvis W Contrast       Note: This dictation was prepared with Dragon dictation along with smaller phrase technology. Any transcriptional errors that result from this process are unintentional.

## 2014-03-01 NOTE — Discharge Summary (Signed)
Physician Discharge Summary  Patient ID: [name] MRN: [id number] DOB/AGE: 1951-01-05 63 y.o.  Admit date: 02/04/2014 Discharge date: 02/05/2014  Admission Diagnoses:  Principal Problem:   Herniated nucleus pulposus, L2-3 right   Discharge Diagnoses:  Same  Past Medical History  Diagnosis Date  . GERD (gastroesophageal reflux disease)   . Hypothyroidism   . Peripheral neuropathy   . Cardiomyopathy   . Chest pain 06/2005    Hospitalized, dystolic dysfunction  . Sciatica   . Type II diabetes mellitus   . PONV (postoperative nausea and vomiting)   . H/O hiatal hernia   . Migraines     "it's been a long time since I've had one" (03/20/2012)  . Osteoarthritis   . Arthritis     "all over; mostly in my back" (03/20/2012)  . Chronic lower back pain     "have 2 herniated discs; going to have to have a fusion" (03/20/2012)  . Kidney stones     "never had OR" (03/20/2012)  . Breast cancer     "right" (03/20/2012)  . Family history of anesthesia complication     "father has nausea and vomiting"  . Irritable bowel syndrome   . Hypertension     "patient states never had HTN, takes Hyzaar for heart  . Anginal pain 03/19/2012    saw Dr. Cathie Olden .. she thinks its more related to stomach issues  . History of right mastectomy     Surgeries: Procedure(s): RIGHT L2-3 MICRODISCECTOMY  on 02/04/2014   Consultants:    Discharged Condition: Improved  Hospital Course: [name] is an 63 y.o. female who was admitted 02/04/2014 with a chief complaint of No chief complaint on file. , and found to have a diagnosis of Herniated nucleus pulposus, L2-3 right.  They were brought to the operating room on 02/04/2014 and underwent the above named procedures.    They were given perioperative antibiotics:  Anti-infectives    Start     Dose/Rate Route Frequency Ordered Stop   02/05/14 0200  vancomycin (VANCOCIN) IVPB 1000 mg/200 mL premix  Status:  Discontinued     1,000 mg200  mL/hr over 60 Minutes Intravenous Every 12 hours 02/04/14 1904 02/05/14 1331   02/04/14 0600  vancomycin (VANCOCIN) IVPB 1000 mg/200 mL premix     1,000 mg200 mL/hr over 60 Minutes Intravenous On call to O.R. 02/03/14 1449 02/04/14 1453    She recovered in PACU normally and was transferred to [facility] 3C overnight recovery unit where she continued to recover. Hemovac drain fell out of the right lower lumbar area overnight. Her vital signs remained normal and blood sugars were  Stable 150-250. POD#1 dressing changed, no drainage or fluctuance. Minimal weakness right hip flexor and per preop but less. Tolerating post operative discomfort with oral narcotic pain medications. She was sitting at bedside eating breakfast. Discharged home on POD#1 after PT evaluation. Recommended 24 hour assistance by family members. Graduated Mobilization exercises without HHN referral.  They were given sequential compression devices and early ambulation for DVT prophylaxis.  They benefited maximally from their hospital stay and there were no complications.    Recent vital signs:  Filed Vitals:   02/05/14 0424  BP: 134/69  Pulse: 83  Temp: 98.6 F (37 C)  Resp: 20    Recent laboratory studies:  Results for orders placed or performed during the hospital encounter of 02/04/14  Glucose, capillary  Result Value Ref Range   Glucose-Capillary 182 (H) 70 - 99 mg/dL  Glucose, capillary  Result Value Ref Range   Glucose-Capillary 207 (H) 70 - 99 mg/dL   Comment 1 Documented in Chart    Comment 2 Notify RN   CBC  Result Value Ref Range   WBC 9.7 4.0 - 10.5 K/uL   RBC 4.30 3.87 - 5.11 MIL/uL   Hemoglobin 12.5 12.0 - 15.0 g/dL   HCT 38.5 36.0 - 46.0 %   MCV 89.5 78.0 - 100.0 fL   MCH 29.1 26.0 - 34.0 pg   MCHC 32.5 30.0 - 36.0 g/dL   RDW 13.9 11.5 - 15.5 %   Platelets 259 150 - 400 K/uL  Glucose, capillary  Result Value Ref Range   Glucose-Capillary 351 (H) 70 - 99 mg/dL   Comment 1 Notify RN    Comment 2  Documented in Chart   Glucose, capillary  Result Value Ref Range   Glucose-Capillary 344 (H) 70 - 99 mg/dL   Comment 1 Notify RN    Comment 2 Documented in Chart   Glucose, capillary  Result Value Ref Range   Glucose-Capillary 294 (H) 70 - 99 mg/dL   Comment 1 Notify RN    Comment 2 Documented in Chart   Glucose, capillary  Result Value Ref Range   Glucose-Capillary 257 (H) 70 - 99 mg/dL    Discharge Medications:     Medication List    TAKE these medications        acetaminophen 325 MG tablet  Commonly known as:  TYLENOL  Take 650 mg by mouth every 6 (six) hours as needed for moderate pain.     aspirin 81 MG tablet  Take 81 mg by mouth daily.     DSS 100 MG Caps  Take 100 mg by mouth 2 (two) times daily.     gabapentin 300 MG capsule  Commonly known as:  NEURONTIN  Take 1 capsule (300 mg total) by mouth 3 (three) times daily.     ibuprofen 200 MG tablet  Commonly known as:  ADVIL,MOTRIN  Take 600 mg by mouth every 4 (four) hours as needed for moderate pain.     insulin aspart 100 UNIT/ML FlexPen  Commonly known as:  NOVOLOG  Inject 15 Units into the skin 3 (three) times daily with meals.     insulin glargine 100 unit/mL Sopn  Commonly known as:  LANTUS  Inject 50 Units into the skin 2 (two) times daily.     levothyroxine 100 MCG tablet  Commonly known as:  SYNTHROID, LEVOTHROID  Take 1 tablet (100 mcg total) by mouth daily.     losartan-hydrochlorothiazide 50-12.5 MG per tablet  Commonly known as:  HYZAAR  Take 1 tablet by mouth daily.     metFORMIN 850 MG tablet  Commonly known as:  GLUCOPHAGE  Take 1 tablet (850 mg total) by mouth 2 (two) times daily with a meal.     methocarbamol 500 MG tablet  Commonly known as:  ROBAXIN  Take 1 tablet (500 mg total) by mouth every 6 (six) hours as needed for muscle spasms.     morphine 20 MG 24 hr capsule  Commonly known as:  KADIAN  Take 20 mg by mouth daily.     nitroGLYCERIN 0.4 MG SL tablet  Commonly known  as:  NITROSTAT  Place 0.4 mg under the tongue every 5 (five) minutes as needed for chest pain.     oxyCODONE 5 MG immediate release tablet  Commonly known as:  Oxy IR/ROXICODONE  Take 1  tablet (5 mg total) by mouth every 4 (four) hours as needed for severe pain.     oxyCODONE-acetaminophen 5-325 MG per tablet  Commonly known as:  PERCOCET/ROXICET  Take 1-2 tablets by mouth every 4 (four) hours as needed for moderate pain.     Pen Needles 5/16" 30G X 8 MM Misc  Use to inject insulin 5x daily.     polyethylene glycol packet  Commonly known as:  MIRALAX / GLYCOLAX  Take 17 g by mouth daily as needed (constipation).     Vitamin D 2000 UNITS Caps  Take 4,000 Units by mouth daily.        Diagnostic Studies: Dg Lumbar Spine 2-3 Views  02/04/2014   CLINICAL DATA:  RIGHT L2-L3 herniated nucleus pulposis  EXAM: LUMBAR SPINE - 2-3 VIEW  COMPARISON:  Two cross-table lateral intraoperative portable views of the lumbar spine are submitted. No prior exams for comparison.  FINDINGS: Exam is presumptively labeled with the indwelling lumbar hardware being labeled as L3-L5.  Uncertain if ribs are present at what is labeled T12.  This numbering system should be correlated with any prior outside imaging patient may have to determine consistency versus prior studies and accuracy of the surgical level.  Image #1 at 1435 hr: BILATERAL pedicle screws and bars with disc prostheses at what is labeled L3-L5. Metallic probes via dorsal approach project dorsal to the L1-L2 disc space and to the mid L3 levels.  Image #2 at [id number] hr: Metallic probe via posterior approach projects dorsal to the what is labeled as the L2-L3 disc space level. This disc space is narrowed. Orthopedic hardware at L3-L5 again noted.  IMPRESSION: Presumptive labeling of the lumbar spine with indwelling lumbar hardware labeled L3-L5.  This should be correlated with any prior imaging the patient may have as discussed above.  Intraoperative lumbar  localization as above.   Electronically Signed   By: Lavonia Dana M.D.   On: 02/04/2014 16:31   Ct Abdomen Pelvis W Contrast  02/14/2014   CLINICAL DATA:  63 year old female with right lower quadrant pain nausea and vomiting for 3 weeks. Initial encounter. Personal history of breast cancer, hysterectomy, bilateral oophorectomy.  EXAM: CT ABDOMEN AND PELVIS WITH CONTRAST  TECHNIQUE: Multidetector CT imaging of the abdomen and pelvis was performed using the standard protocol following bolus administration of intravenous contrast.  CONTRAST:  129mL OMNIPAQUE IOHEXOL 300 MG/ML  SOLN  COMPARISON:  CT Abdomen and Pelvis 12/03/2010. Lumbar MRI 12/17/2013  FINDINGS: Sequelae of right mastectomy. Stable and negative lung bases. Mild elevation of the right hemidiaphragm. No pericardial or pleural effusion.  Degenerative and postoperative changes re - identified in the lumbar spine. See 12/17/2013 comparison. No acute or suspicious osseous lesion identified.  No pelvic free fluid. Uterus and adnexa surgically absent. Decompressed mildly redundant distal colon. Mildly distended bladder. Small fact containing inguinal hernias are unchanged.  Oral contrast has reached the sigmoid colon. Negative left colon. Negative transverse colon. Mildly redundant but otherwise negative right colon. Normal appendix. Negative terminal ileum. No dilated small bowel. Small fact containing umbilical hernia is unchanged. Negative stomach and duodenum which are largely decompressed.  Mild hepatic steatosis re - identified. No discrete liver lesion. Gallbladder, spleen, pancreas, adrenal glands, and portal venous system are within normal limits. Major arterial structures in the abdomen and pelvis are patent with no significant atherosclerotic plaque.  Chronic left midpole nephrolithiasis is stable. No hydronephrosis or perinephric stranding. Renal enhancement and contrast excretion within normal limits. No abdominal free  fluid. No lymphadenopathy.   IMPRESSION: No acute findings and largely unchanged CT appearance of the abdomen and pelvis since 2012.  Interval postoperative changes to the lumbar spine.   Electronically Signed   By: Lars Pinks M.D.   On: 02/14/2014 14:18    Disposition: 01-Home or Self Care      Discharge Instructions    Call MD / Call 911    Complete by:  As directed   If you experience chest pain or shortness of breath, CALL 911 and be transported to the hospital emergency room.  If you develope a fever above 101 F, pus (white drainage) or increased drainage or redness at the wound, or calf pain, call your surgeon's office.     Constipation Prevention    Complete by:  As directed   Drink plenty of fluids.  Prune juice may be helpful.  You may use a stool softener, such as Colace (over the counter) 100 mg twice a day.  Use MiraLax (over the counter) for constipation as needed.     Diet Carb Modified    Complete by:  As directed      Discharge instructions    Complete by:  As directed   No lifting greater than 10 lbs. Avoid bending, stooping and twisting. Walk in house for first week them may start to get out slowly increasing distance up to 1/2 mile by 3 weeks post op. Keep incision dry for 3 days, may use tegaderm or similar water impervious dressing.     Driving restrictions    Complete by:  As directed   No driving for 4 weeks     Increase activity slowly as tolerated    Complete by:  As directed      Lifting restrictions    Complete by:  As directed   No lifting for 6 weeks           Follow-up Information    Follow up with NITKA,JAMES E, MD In 2 weeks.   Specialty:  Orthopedic Surgery   Contact information:   [address] [place] [address] [phone]        Signed: Jessy Oto 03/01/2014, 9:58 AM

## 2014-03-19 ENCOUNTER — Telehealth: Payer: Self-pay | Admitting: *Deleted

## 2014-03-19 NOTE — Telephone Encounter (Signed)
Submitted [employer] referral thru acuity connect for authorization to Dr. Rosario Jacks ortho, with authorizaton number [id number], faxed paper copy to Dr. Louanne Skye office.

## 2014-04-16 DIAGNOSIS — M961 Postlaminectomy syndrome, not elsewhere classified: Secondary | ICD-10-CM | POA: Diagnosis not present

## 2014-04-16 DIAGNOSIS — M5116 Intervertebral disc disorders with radiculopathy, lumbar region: Secondary | ICD-10-CM | POA: Diagnosis not present

## 2014-04-16 DIAGNOSIS — M4316 Spondylolisthesis, lumbar region: Secondary | ICD-10-CM | POA: Diagnosis not present

## 2014-04-16 DIAGNOSIS — M47817 Spondylosis without myelopathy or radiculopathy, lumbosacral region: Secondary | ICD-10-CM | POA: Diagnosis not present

## 2014-04-23 DIAGNOSIS — M5116 Intervertebral disc disorders with radiculopathy, lumbar region: Secondary | ICD-10-CM | POA: Diagnosis not present

## 2014-04-23 DIAGNOSIS — M961 Postlaminectomy syndrome, not elsewhere classified: Secondary | ICD-10-CM | POA: Diagnosis not present

## 2014-04-23 DIAGNOSIS — M47817 Spondylosis without myelopathy or radiculopathy, lumbosacral region: Secondary | ICD-10-CM | POA: Diagnosis not present

## 2014-04-24 ENCOUNTER — Ambulatory Visit (INDEPENDENT_AMBULATORY_CARE_PROVIDER_SITE_OTHER): Payer: Commercial Managed Care - HMO | Admitting: Family Medicine

## 2014-04-24 ENCOUNTER — Encounter: Payer: Self-pay | Admitting: Family Medicine

## 2014-04-24 VITALS — BP 138/80 | HR 72 | Temp 98.2°F | Resp 18 | Ht 65.0 in | Wt 197.0 lb

## 2014-04-24 DIAGNOSIS — E1165 Type 2 diabetes mellitus with hyperglycemia: Secondary | ICD-10-CM

## 2014-04-24 DIAGNOSIS — IMO0002 Reserved for concepts with insufficient information to code with codable children: Secondary | ICD-10-CM

## 2014-04-24 MED ORDER — INSULIN ASPART 100 UNIT/ML FLEXPEN: 15.0000 [IU] | PEN_INJECTOR | Freq: Three times a day (TID) | SUBCUTANEOUS | Status: DC

## 2014-04-24 MED ORDER — INSULIN GLARGINE 100 UNITS/ML SOLOSTAR PEN
45.0000 [IU] | PEN_INJECTOR | Freq: Two times a day (BID) | SUBCUTANEOUS | Status: DC
Start: 2014-04-24 — End: 2015-04-30

## 2014-04-24 NOTE — Progress Notes (Signed)
Subjective:    Patient ID: [name], female    DOB: Aug 24, 1950, 64 y.o.   MRN: [id number]  HPI Patient is currently on 45 units of Lantus twice daily. She has not been doing the NovoLog 15 units with meals. She also has not been doing sliding scale. Up until a week ago, her fasting blood sugars are typically 160-200. Her 2 hour postprandial sugars are typically 160-200. Recently she received a cortisone shot and since that time her blood sugars have been 300-400. Past Medical History  Diagnosis Date  . GERD (gastroesophageal reflux disease)   . Hypothyroidism   . Peripheral neuropathy   . Cardiomyopathy   . Chest pain 06/2005    Hospitalized, dystolic dysfunction  . Sciatica   . Type II diabetes mellitus   . PONV (postoperative nausea and vomiting)   . H/O hiatal hernia   . Migraines     "it's been a long time since I've had one" (03/20/2012)  . Osteoarthritis   . Arthritis     "all over; mostly in my back" (03/20/2012)  . Chronic lower back pain     "have 2 herniated discs; going to have to have a fusion" (03/20/2012)  . Kidney stones     "never had OR" (03/20/2012)  . Breast cancer     "right" (03/20/2012)  . Family history of anesthesia complication     "father has nausea and vomiting"  . Irritable bowel syndrome   . Hypertension     "patient states never had HTN, takes Hyzaar for heart  . Anginal pain 03/19/2012    saw Dr. Cathie Olden .. she thinks its more related to stomach issues  . History of right mastectomy    Past Surgical History  Procedure Laterality Date  . Abdominal hysterectomy  1993  . Total knee arthroplasty  2000    Right  . Mastectomy with axillary lymph node dissection  12/2003    "right" (03/20/2012)  . Thyroidectomy  1977    Right  . Posterior cervical fusion/foraminotomy  2001  . Shoulder arthroscopy w/ rotator cuff repair  2003    Left  . Cardiac catheterization  ? 2005 / 2007  . Adenosin cardiolite  07/2005    (+) wall motion  abnormality  . Esophagogastroduodenoscopy  2005    GERD  . [place] of abdomen  07/2005    Fatty liver / kidney stones  . Ct abdomen and pelvis  02/2010    Same  . Knee arthroscopy  1990's    "right" (03/20/2012)  . Axillary lymph node dissection  2003    squamous cell cancer  . Back surgery    . Lumbar laminectomy/decompression microdiscectomy N/A 02/04/2014    Procedure: RIGHT L2-3 MICRODISCECTOMY ;  Surgeon: Jessy Oto, MD;  Location: [facility];  Service: Orthopedics;  Laterality: N/A;   Current Outpatient Prescriptions on File Prior to Visit  Medication Sig Dispense Refill  . acetaminophen (TYLENOL) 325 MG tablet Take 650 mg by mouth every 6 (six) hours as needed for moderate pain.    [name] Kitchen aspirin 81 MG tablet Take 81 mg by mouth daily.    . Cholecalciferol (VITAMIN D) 2000 UNITS CAPS Take 4,000 Units by mouth daily.    [name] Kitchen docusate sodium 100 MG CAPS Take 100 mg by mouth 2 (two) times daily. 10 capsule   . gabapentin (NEURONTIN) 300 MG capsule Take 1 capsule (300 mg total) by mouth 3 (three) times daily. 90 capsule 3  . ibuprofen (ADVIL,MOTRIN)  200 MG tablet Take 600 mg by mouth every 4 (four) hours as needed for moderate pain.    . Insulin Pen Needle (PEN NEEDLES 5/16") 30G X 8 MM MISC Use to inject insulin 5x daily. 200 each 11  . levothyroxine (SYNTHROID, LEVOTHROID) 100 MCG tablet Take 1 tablet (100 mcg total) by mouth daily. 30 tablet 11  . losartan-hydrochlorothiazide (HYZAAR) 50-12.5 MG per tablet Take 1 tablet by mouth daily. 30 tablet 11  . metFORMIN (GLUCOPHAGE) 850 MG tablet Take 1 tablet (850 mg total) by mouth 2 (two) times daily with a meal. 60 tablet 3  . methocarbamol (ROBAXIN) 500 MG tablet Take 1 tablet (500 mg total) by mouth every 6 (six) hours as needed for muscle spasms. 40 tablet 1  . morphine (KADIAN) 20 MG 24 hr capsule Take 20 mg by mouth daily.     . nitroGLYCERIN (NITROSTAT) 0.4 MG SL tablet Place 0.4 mg under the tongue every 5 (five) minutes as needed for chest  pain.     [name] Kitchen oxyCODONE (OXY IR/ROXICODONE) 5 MG immediate release tablet Take 1 tablet (5 mg total) by mouth every 4 (four) hours as needed for severe pain. 18 tablet 0  . oxyCODONE-acetaminophen (PERCOCET/ROXICET) 5-325 MG per tablet Take 1-2 tablets by mouth every 4 (four) hours as needed for moderate pain. 60 tablet 0  . polyethylene glycol (MIRALAX / GLYCOLAX) packet Take 17 g by mouth daily as needed (constipation).     . promethazine (PHENERGAN) 25 MG tablet Take 1 tablet (25 mg total) by mouth every 8 (eight) hours as needed for nausea or vomiting. 20 tablet 2   No current facility-administered medications on file prior to visit.   Allergies  Allergen Reactions  . Dilaudid [Hydromorphone Hcl] Nausea And Vomiting  . Ampicillin Rash    REACTION: Rash and welts on her hands when she took it in the hospital  . Latex Itching, Dermatitis and Rash    REACTION: Patient had a reaction to tape after surgery and they told her she was allergic to Latex 03/20/2012 "if it's on too long it will break me out"  . Penicillins Rash   History   Social History  . Marital Status: Married    Spouse Name: N/A    Number of Children: 1  . Years of Education: N/A   Occupational History  . Retired since 2003    Social History Main Topics  . Smoking status: Never Smoker   . Smokeless tobacco: Never Used  . Alcohol Use: No  . Drug Use: No  . Sexual Activity: Not on file   Other Topics Concern  . Not on file   Social History Narrative      Review of Systems  All other systems reviewed and are negative.      Objective:   Physical Exam  Cardiovascular: Normal rate, regular rhythm and normal heart sounds.   Pulmonary/Chest: Effort normal and breath sounds normal.  Abdominal: Soft. Bowel sounds are normal.  Vitals reviewed.         Assessment & Plan:  Diabetes mellitus type II, uncontrolled  Continue Lantus 45 units twice daily. Begin NovoLog 15 units 3 times a day with meals. Also  add a NovoLog correction factor to 15 units with meals based on her blood sugar. She will add 2 units to 15 units for every 50 points greater than 150.  Recheck fasting blood sugar and 2 hour postprandial sugar in one month

## 2014-05-07 DIAGNOSIS — M961 Postlaminectomy syndrome, not elsewhere classified: Secondary | ICD-10-CM | POA: Diagnosis not present

## 2014-05-07 DIAGNOSIS — M47817 Spondylosis without myelopathy or radiculopathy, lumbosacral region: Secondary | ICD-10-CM | POA: Diagnosis not present

## 2014-05-07 DIAGNOSIS — M4316 Spondylolisthesis, lumbar region: Secondary | ICD-10-CM | POA: Diagnosis not present

## 2014-05-07 DIAGNOSIS — M5440 Lumbago with sciatica, unspecified side: Secondary | ICD-10-CM | POA: Diagnosis not present

## 2014-05-07 DIAGNOSIS — M5116 Intervertebral disc disorders with radiculopathy, lumbar region: Secondary | ICD-10-CM | POA: Diagnosis not present

## 2014-05-12 ENCOUNTER — Telehealth: Payer: Self-pay | Admitting: *Deleted

## 2014-05-12 NOTE — Telephone Encounter (Signed)
Received fax from [facility] with authorization to [place] with authorization number [id number] requesting provider Justin Mend MD  Procedure: [phone] Lumbar Spine w/o Dye  Dx: M54.5-low back pain  Number of visits: 1  Start Date: 05/09/14 end date: 11/05/14

## 2014-05-13 ENCOUNTER — Other Ambulatory Visit: Payer: Self-pay | Admitting: Specialist

## 2014-05-13 DIAGNOSIS — M545 Low back pain: Secondary | ICD-10-CM

## 2014-06-02 ENCOUNTER — Ambulatory Visit
Admission: RE | Admit: 2014-06-02 | Discharge: 2014-06-02 | Disposition: A | Discharge status: Home / Self Care | Payer: Commercial Managed Care - HMO | Source: Ambulatory Visit | Attending: Specialist | Admitting: Specialist

## 2014-06-02 DIAGNOSIS — M4806 Spinal stenosis, lumbar region: Secondary | ICD-10-CM | POA: Diagnosis not present

## 2014-06-02 DIAGNOSIS — M545 Low back pain: Secondary | ICD-10-CM

## 2014-06-02 DIAGNOSIS — M5126 Other intervertebral disc displacement, lumbar region: Secondary | ICD-10-CM | POA: Diagnosis not present

## 2014-06-02 DIAGNOSIS — M4316 Spondylolisthesis, lumbar region: Secondary | ICD-10-CM | POA: Diagnosis not present

## 2014-06-02 MED ORDER — GADOBENATE DIMEGLUMINE 529 MG/ML IV SOLN
19.0000 mL | Freq: Once | INTRAVENOUS | Status: AC | PRN
  Administered 2014-06-02: 19 mL via INTRAVENOUS

## 2014-06-05 DIAGNOSIS — M5116 Intervertebral disc disorders with radiculopathy, lumbar region: Secondary | ICD-10-CM | POA: Diagnosis not present

## 2014-06-05 DIAGNOSIS — M4316 Spondylolisthesis, lumbar region: Secondary | ICD-10-CM | POA: Diagnosis not present

## 2014-06-10 IMAGING — MR MR LUMBAR SPINE W/O CM
5 series · 47 of 48 positions shown · non-contrast
Comparison: Radiographs dated 02/07/2012 and lumbar MRI dated
03/16/2011

CLINICAL DATA: Low back pain and right leg pain.  L3 or L4
radiculopathy.

MRI LUMBAR SPINE WITHOUT CONTRAST
TECHNIQUE: Multiplanar and multiecho pulse sequences of the lumbar
spine were obtained without intravenous contrast.

[Series 2: T2 · sagittal · 4.0mm · 0.88mm/px · 6 of 13 slices shown (1 of 2)]
[im 1/13]
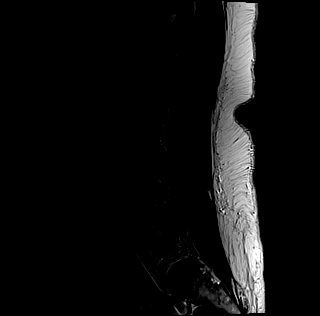
[im 3/13]
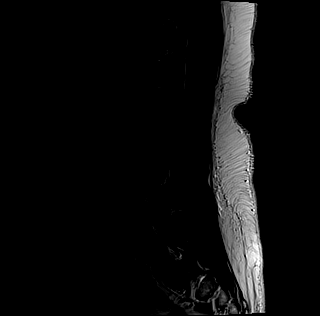
[im 5/13]
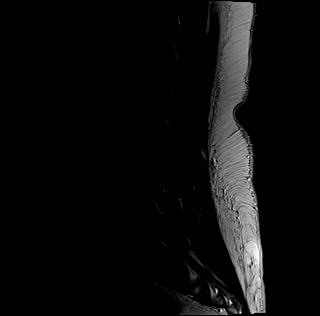
[im 8/13]
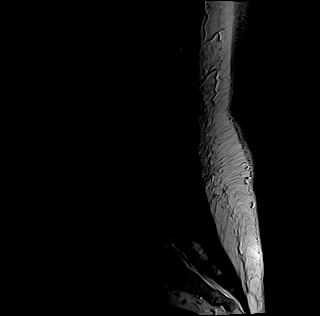
[im 10/13]
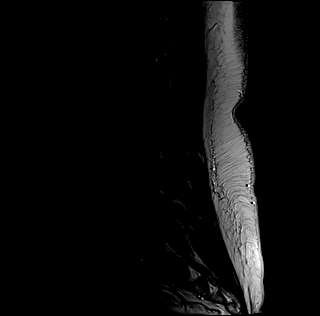
[im 13/13]
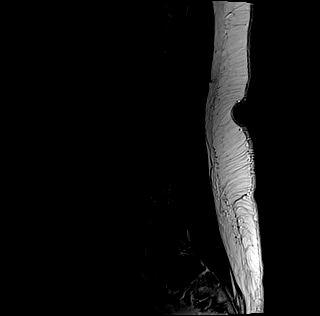

[Series 3: T1 · sagittal · 4.0mm · 0.88mm/px · 5 of 13 slices shown (1 of 2)]
[im 1/13]
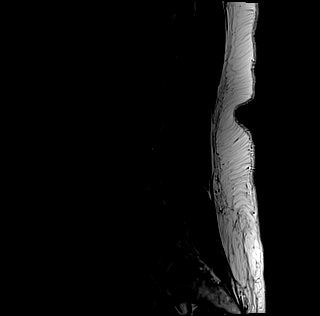
[im 4/13]
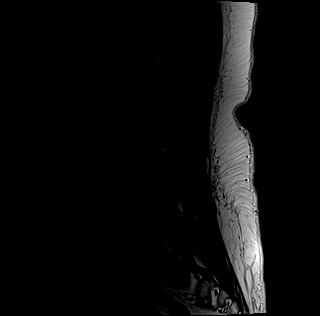
[im 7/13]
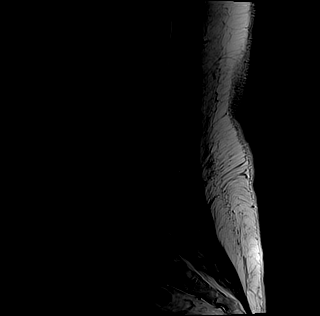
[im 10/13]
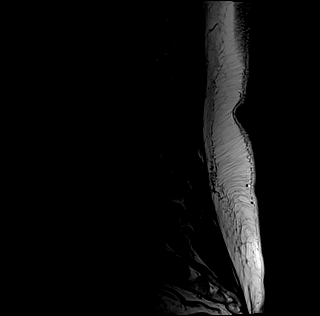
[im 13/13]
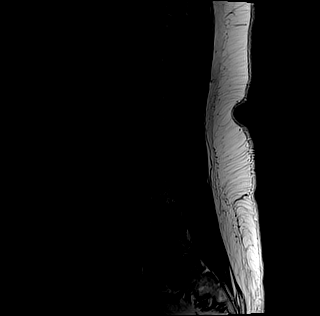

[Series 4: tirm sag · sagittal · 4.0mm · 0.55mm/px · 5 of 13 slices shown]
[im 1/13]
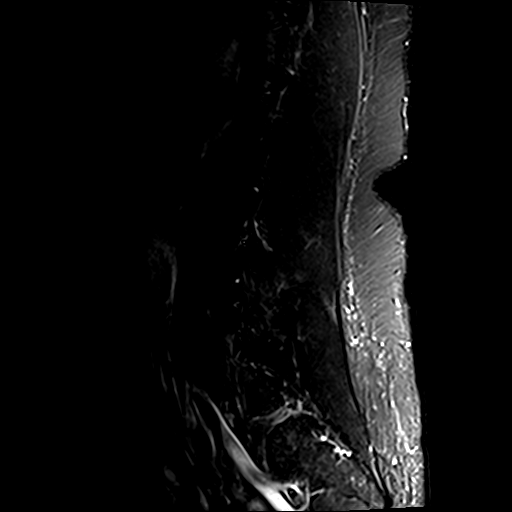
[im 4/13]
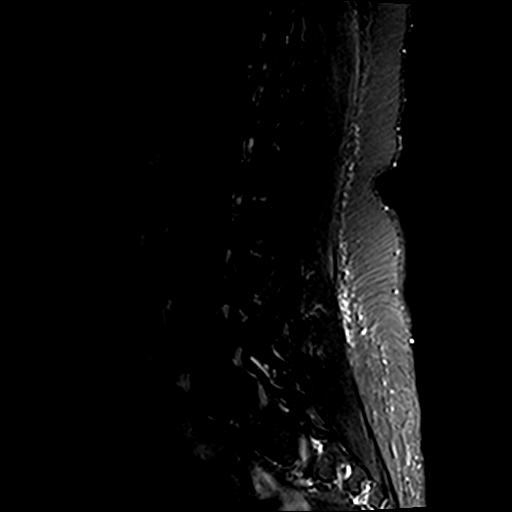
[im 7/13]
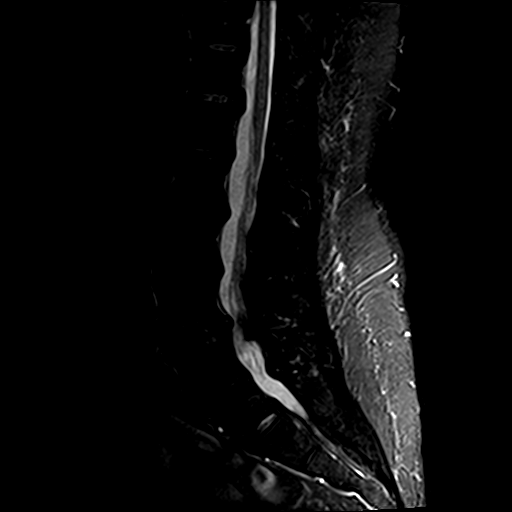
[im 10/13]
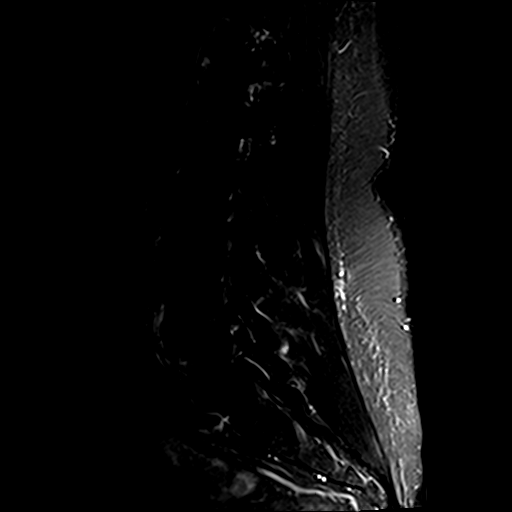
[im 13/13]
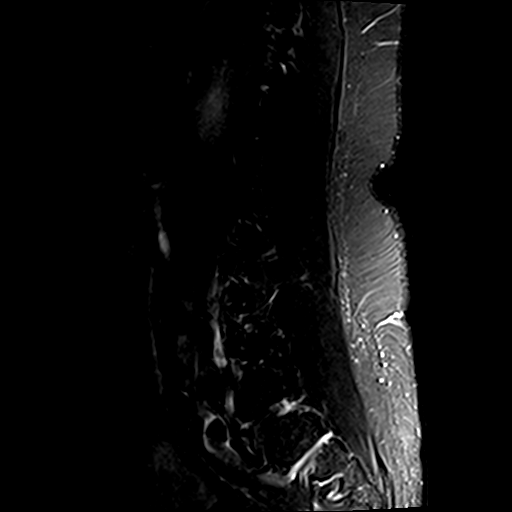

[Series 5: T1 · axial · 4.0mm · 0.70mm/px · z∈[-123,+82]mm · 15 of 38 slices shown (2 of 2)]
[im 1/38]
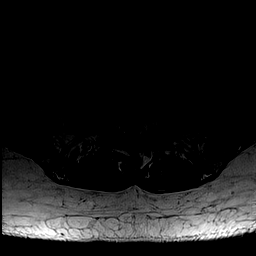
[im 3/38]
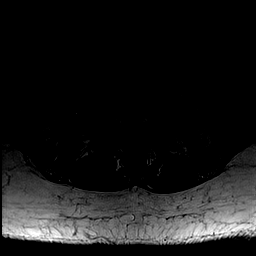
[im 5/38]
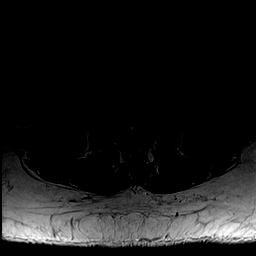
[im 8/38]
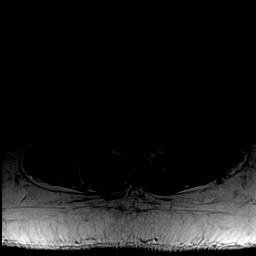
[im 10/38]
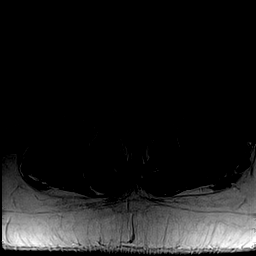
[im 13/38]
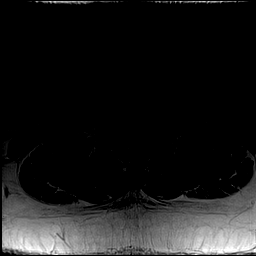
[im 15/38]
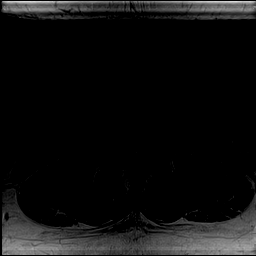
[im 18/38]
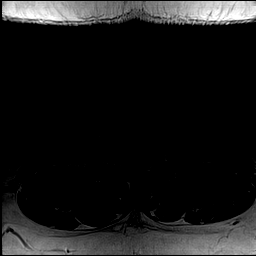
[im 20/38]
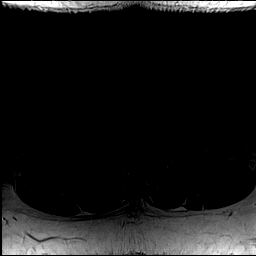
[im 23/38]
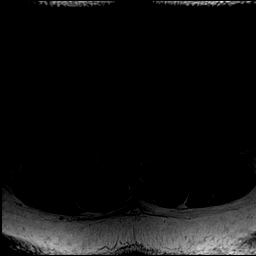
[im 25/38]
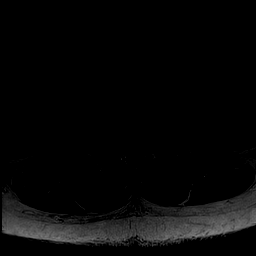
[im 28/38]
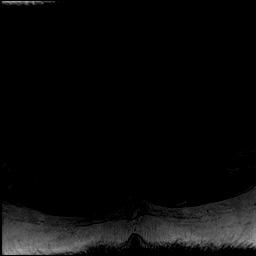
[im 30/38]
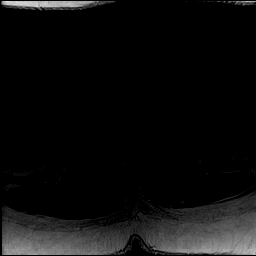
[im 33/38]
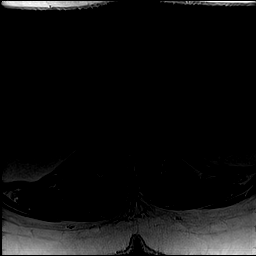
[im 38/38]
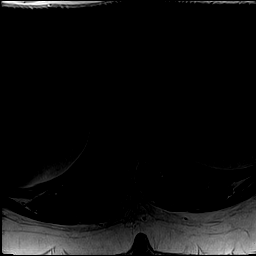

[Series 6: T2 · axial · 4.0mm · 0.70mm/px · z∈[-123,+82]mm · 16 of 38 slices shown (2 of 2)]
[im 1/38]
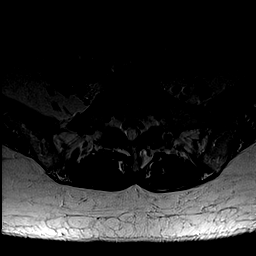
[im 3/38]
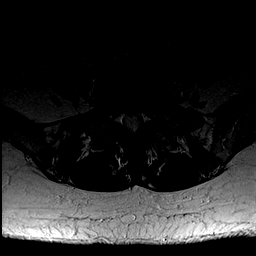
[im 5/38]
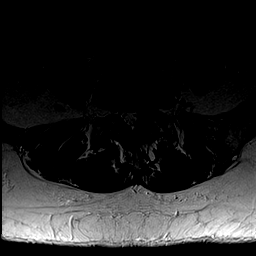
[im 8/38]
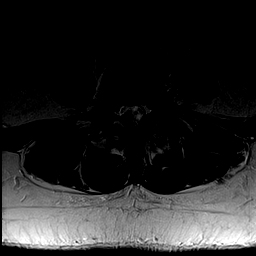
[im 10/38]
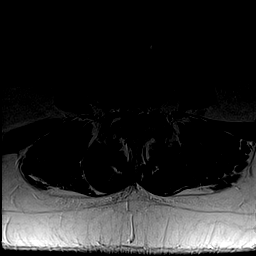
[im 13/38]
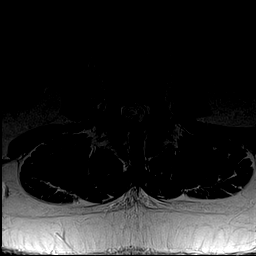
[im 15/38]
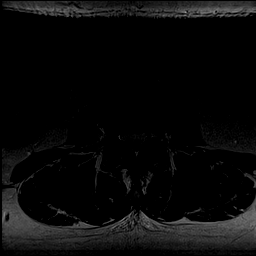
[im 18/38]
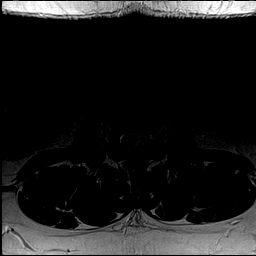
[im 20/38]
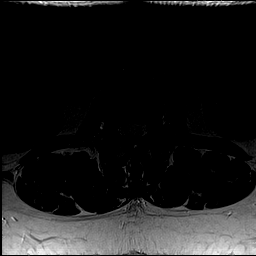
[im 23/38]
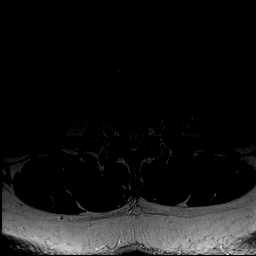
[im 25/38]
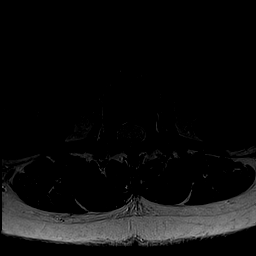
[im 28/38]
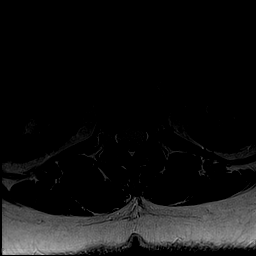
[im 30/38]
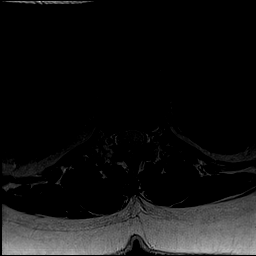
[im 33/38]
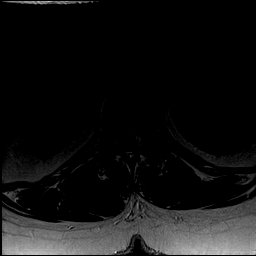
[im 35/38]
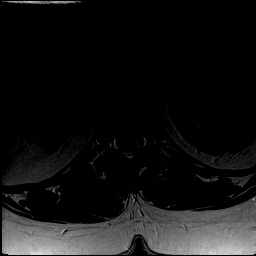
[im 38/38]
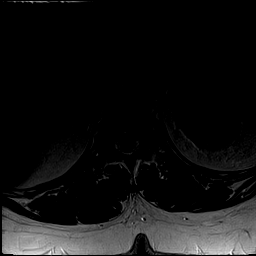

[47 of 48 positions shown; findings below may reference images not displayed]

FINDINGS: Tip of the conus is at L1-2 and appears normal.  Normal
paraspinal soft tissues.

T11-12 through L2-3:  No significant change since the prior study.
Minimal disc bulges at T12-L1 and L2-3 without neural impingement,
stable.

L3-4:  New tiny broad-based disc bulge.  Slight arthritis of both
facet joints, stable.

L4-5:  New broad-based disc protrusion slightly asymmetric to the
right with increased hypertrophy of the ligamenta flava and facet
joints.  2 mm spondylolisthesis.  This creates moderately severe
spinal stenosis and severe right and moderately severe left lateral
recess stenosis.

L5-S1:  Tiny central disc bulge with no neural impingement.  Severe
bilateral facet arthritis and ligamentum flavum hypertrophy with
severe left foraminal stenosis which could affect the left L5
nerve.  This appears essentially unchanged.
IMPRESSION: 1.  Increased severe bilateral facet arthritis at L4-5 with new
effusions in both joints.  New small broad-based disc protrusion at
L4-5 creates increasingly severe spinal stenosis and increasingly
severe bilateral lateral recess stenosis which could affect either
or both L5 nerves.
2.  Persistent left foraminal stenosis at L5-S1 with severe
bilateral facet arthritis, essentially unchanged.

## 2014-06-17 DIAGNOSIS — M25551 Pain in right hip: Secondary | ICD-10-CM | POA: Diagnosis not present

## 2014-06-17 DIAGNOSIS — M1611 Unilateral primary osteoarthritis, right hip: Secondary | ICD-10-CM | POA: Diagnosis not present

## 2014-06-18 ENCOUNTER — Telehealth: Payer: Self-pay | Admitting: *Deleted

## 2014-06-18 NOTE — Telephone Encounter (Signed)
Submitted [employer] referral thru acuity connect for authorization to  Vonzella Nipple, MD with authorization [phone]  Requesting provider: Basil Dess, MD  Treating provider: Leandrew Koyanagi  Number of visits: 1  Start Date: 06/17/14  End Date:12/14/14  Dx: M51.16- Interverbral disc disorder w/radiculopathy,lumbar region       M43.16- Spondylolisthesis,lumbar region  Procedure: [id number]-Drain/inj Joint/Bursa w/o [place]

## 2014-07-01 DIAGNOSIS — M4316 Spondylolisthesis, lumbar region: Secondary | ICD-10-CM | POA: Diagnosis not present

## 2014-07-01 DIAGNOSIS — M5116 Intervertebral disc disorders with radiculopathy, lumbar region: Secondary | ICD-10-CM | POA: Diagnosis not present

## 2014-07-01 DIAGNOSIS — M961 Postlaminectomy syndrome, not elsewhere classified: Secondary | ICD-10-CM | POA: Diagnosis not present

## 2014-07-11 IMAGING — CR DG CHEST 2V
2 series · 2 of 2 positions shown · non-contrast
Comparison: 03/18/2009.

CLINICAL DATA: Rigors.  Chest pain.  Diabetes.

CHEST - 2 VIEW

[w chest pa]
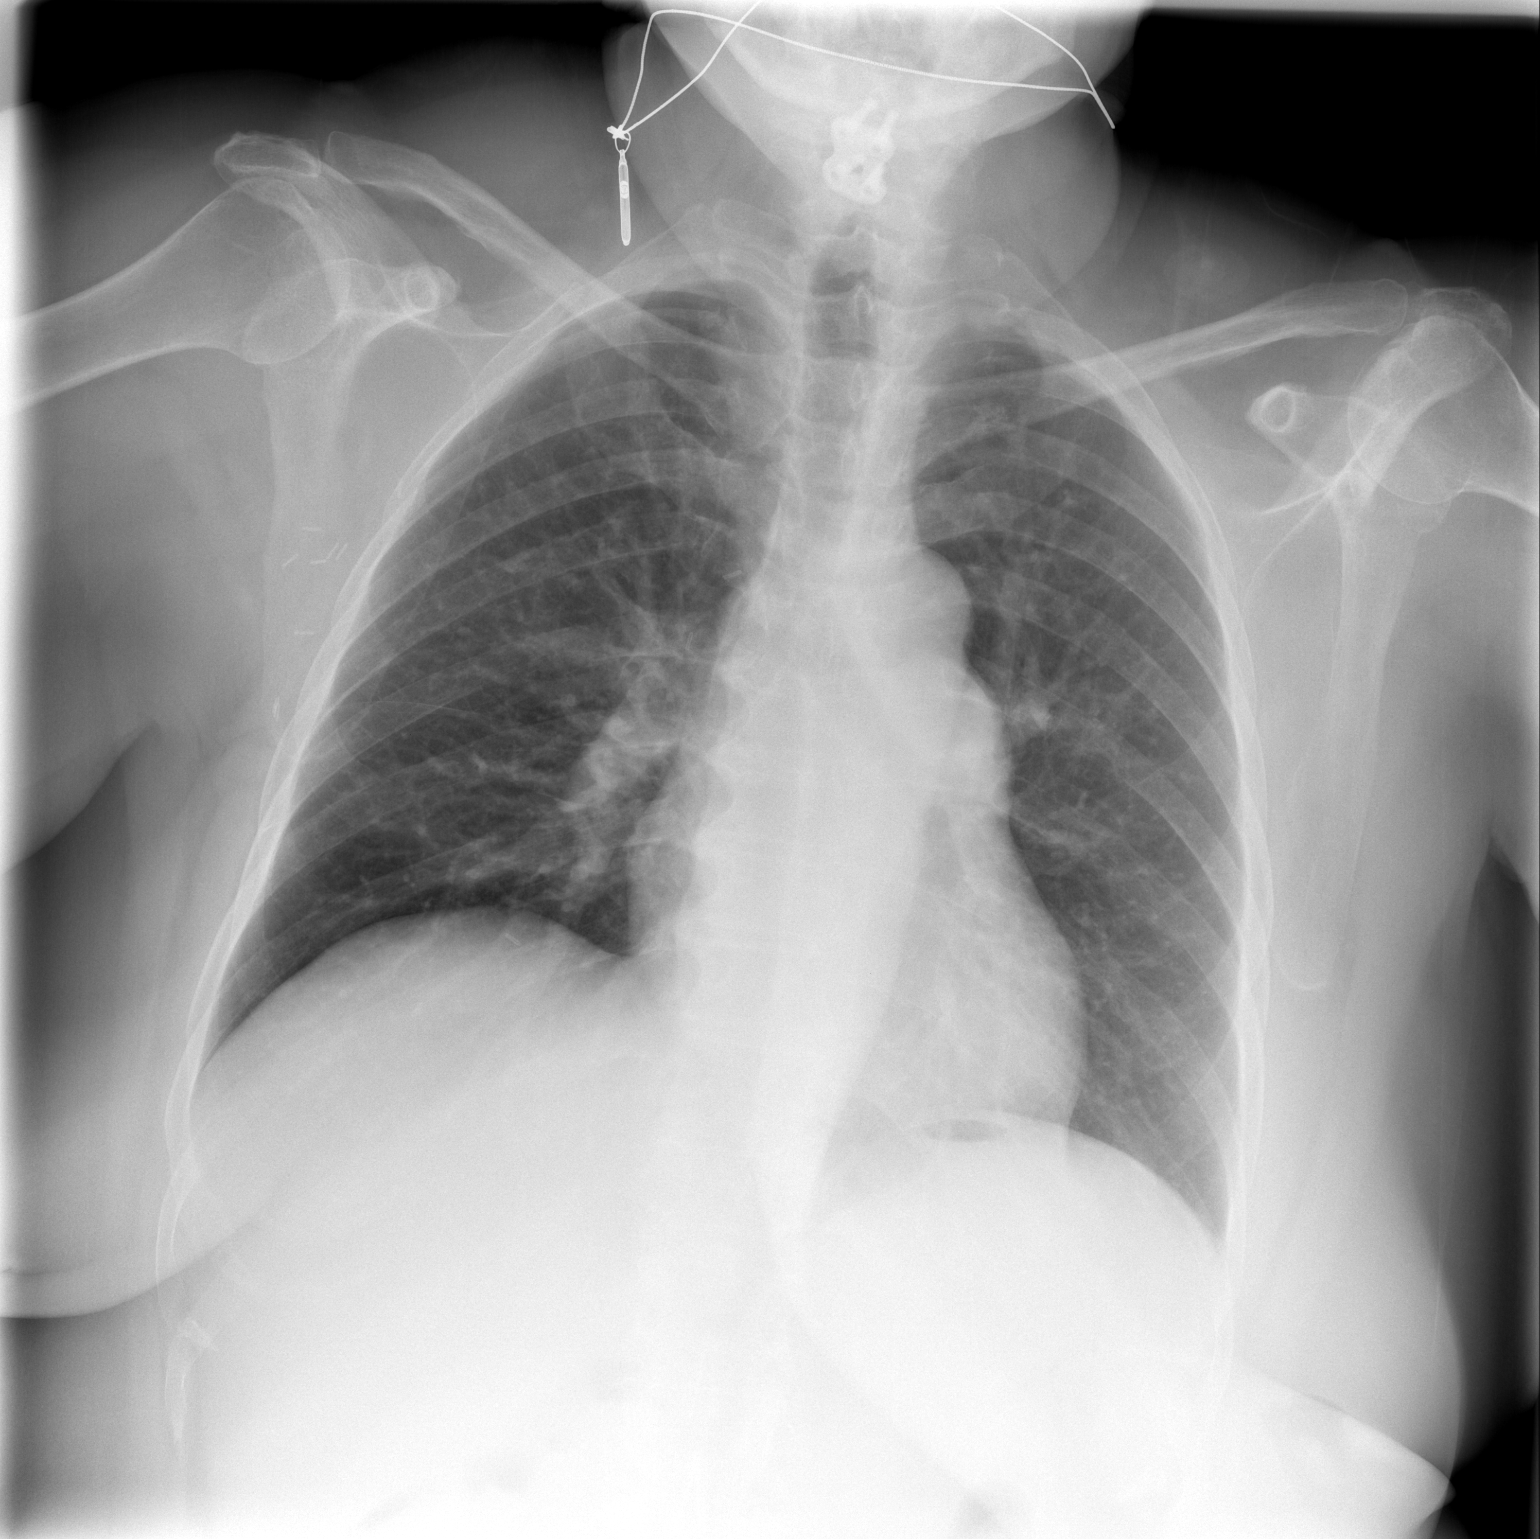

[w chest lat]
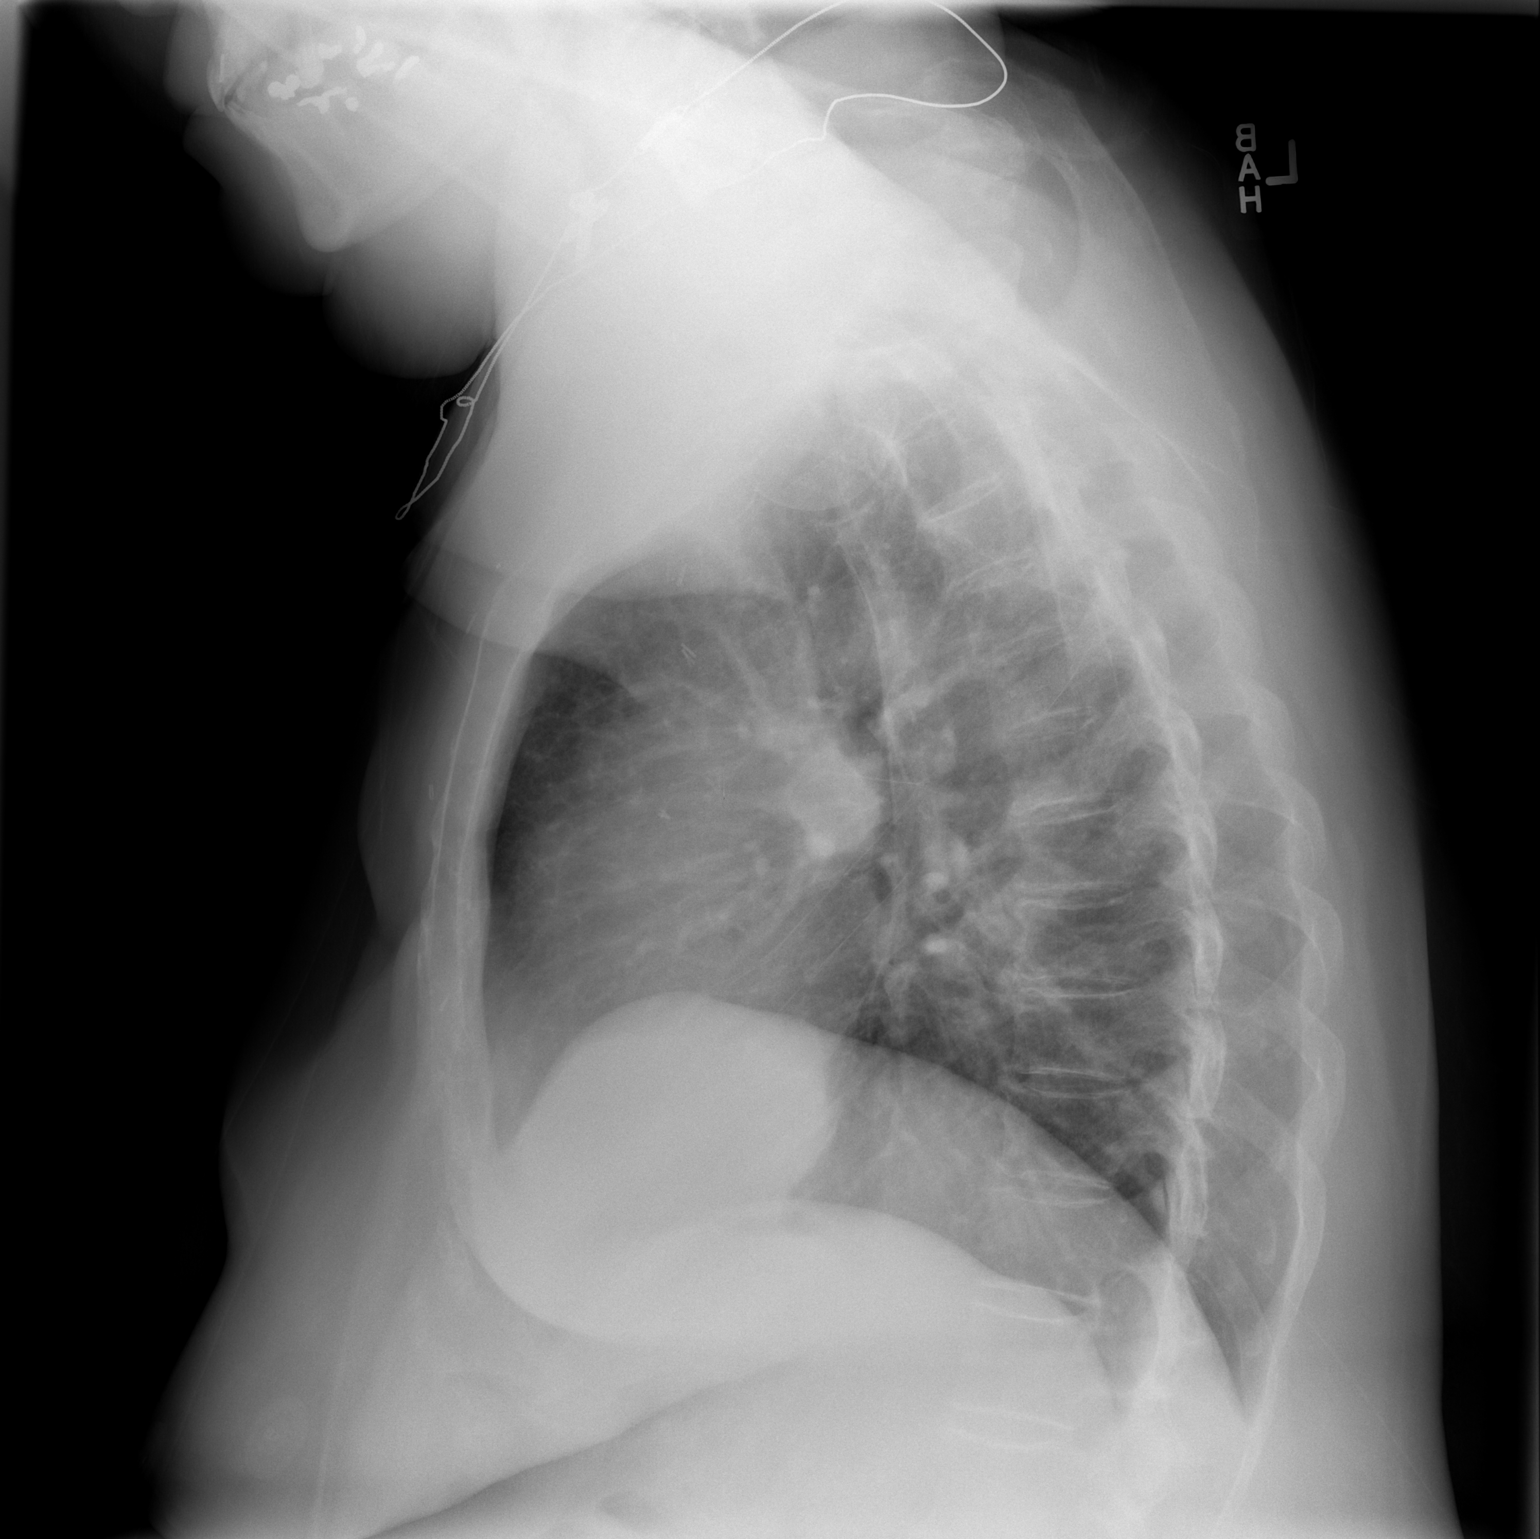

[2 of 2 positions shown; findings below may reference images not displayed]

FINDINGS: Right axillary dissection clips are noted.  The
cardiopericardial silhouette appears within normal limits.  No
airspace disease.  No effusion.  Trachea midline.
IMPRESSION: No acute cardiopulmonary disease.  No interval change.

## 2014-07-12 IMAGING — CT CT ANGIO CHEST
2 of 6 series · 19 of 46 positions shown · IV contrast (omnipaque)
Comparison: 04/29/2008

CLINICAL DATA: Chest pain and shortness of breath

CT ANGIOGRAPHY CHEST
TECHNIQUE: Multidetector CT imaging of the chest using the
standard protocol during bolus administration of intravenous
contrast. Multiplanar reconstructed images including MIPs were
obtained and reviewed to evaluate the vascular anatomy.
Contrast: 100mL OMNIPAQUE IOHEXOL 350 MG/ML SOLN

[Series 6: pulm embolism 1.0 b25f thin · axial · 0.70mm/px · z∈[+1004,+1266]mm · 16 of 288 slices shown]
[im 13/288  lung]
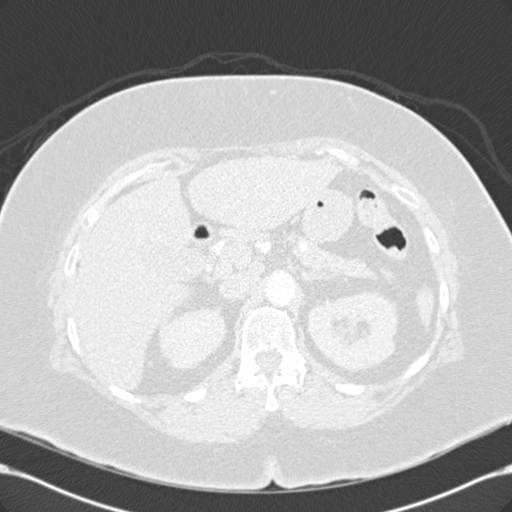
[im 38/288  soft-tissue]
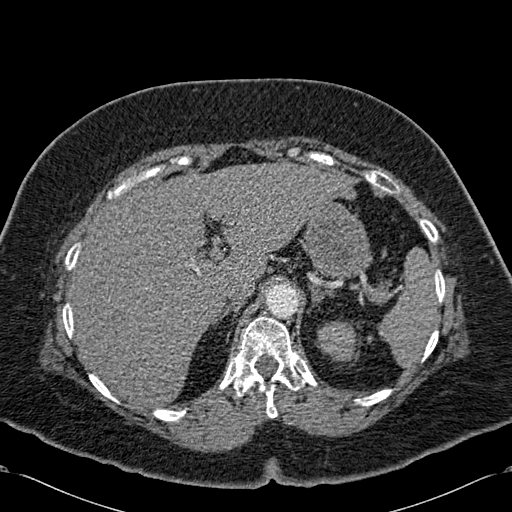
[im 50/288  lung]
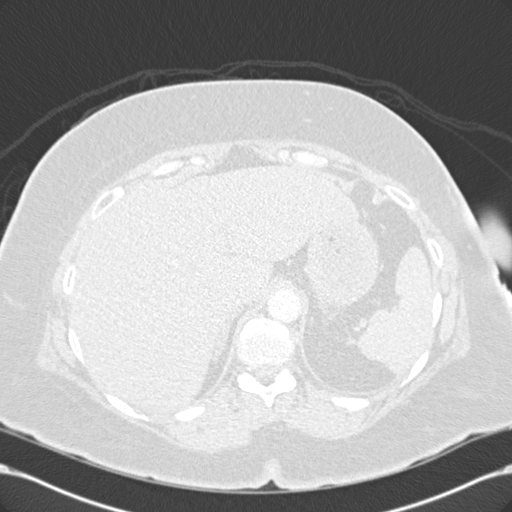
[im 63/288  soft-tissue]
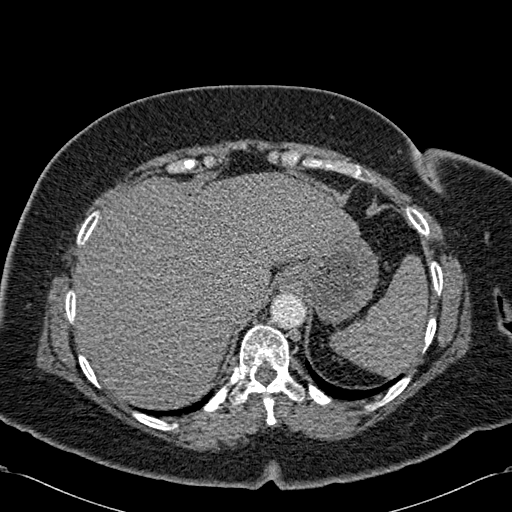
[im 88/288  lung]
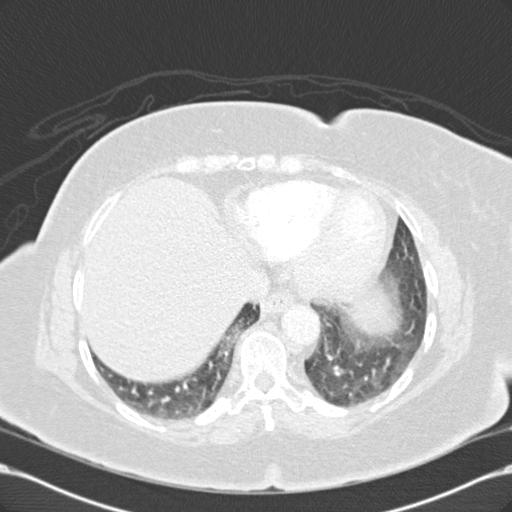
[im 100/288  soft-tissue]
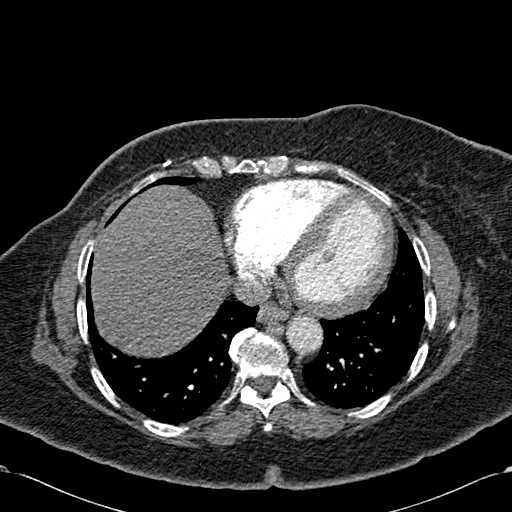
[im 113/288  lung]
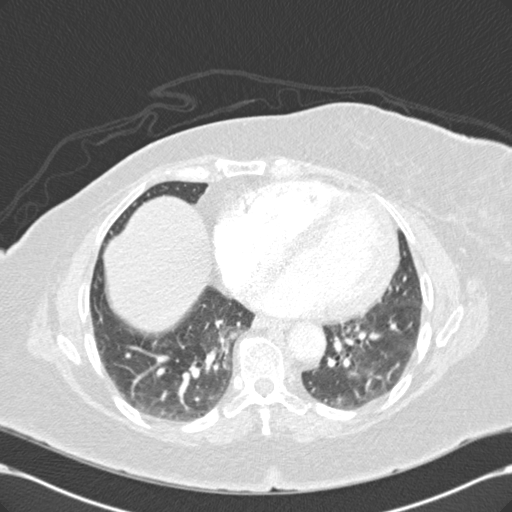
[im 138/288  soft-tissue]
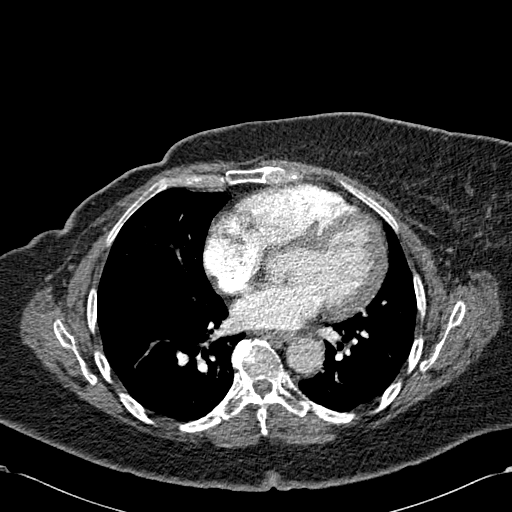
[im 150/288  lung]
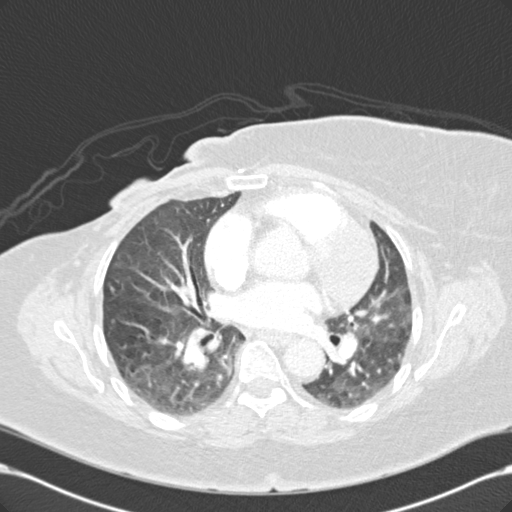
[im 175/288  soft-tissue]
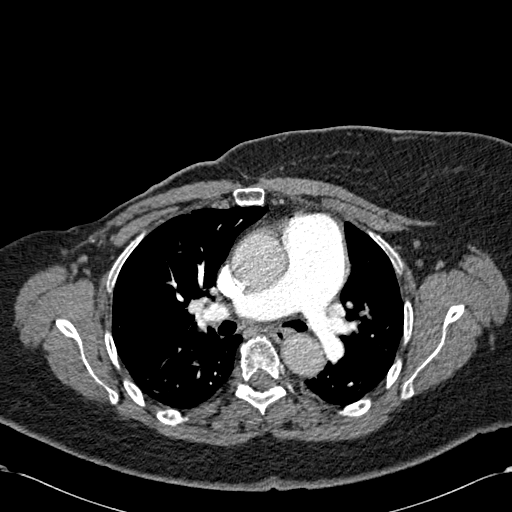
[im 188/288  lung]
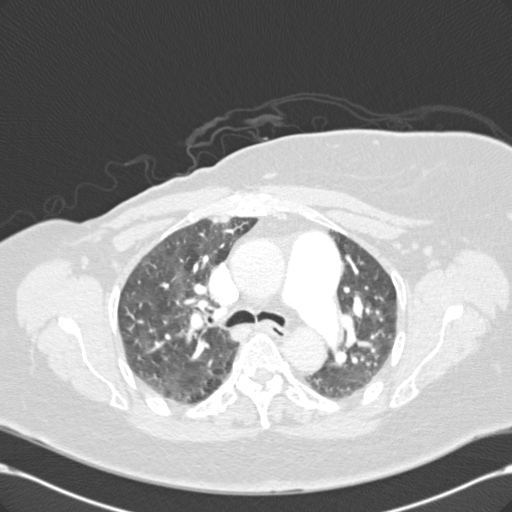
[im 200/288  soft-tissue]
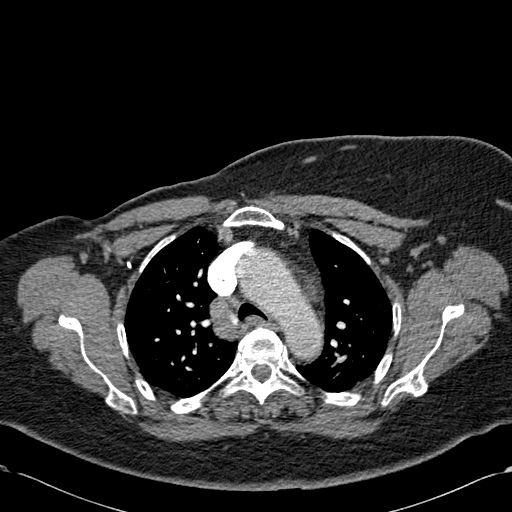
[im 225/288  lung]
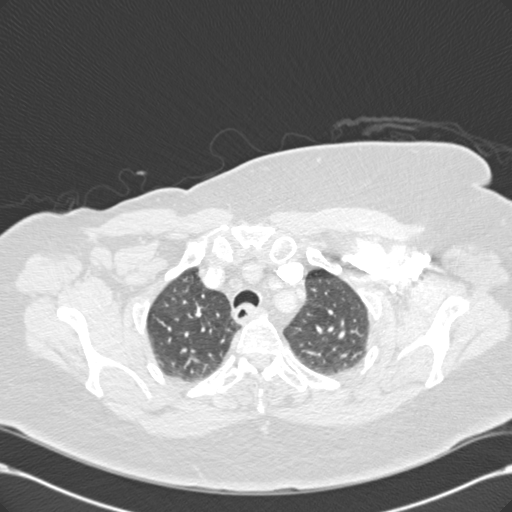
[im 238/288  soft-tissue]
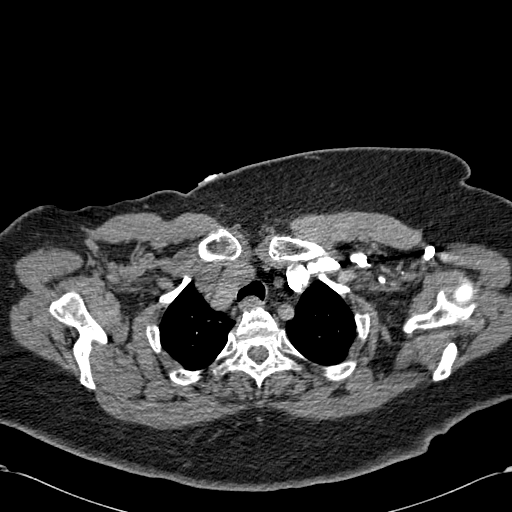
[im 250/288  lung]
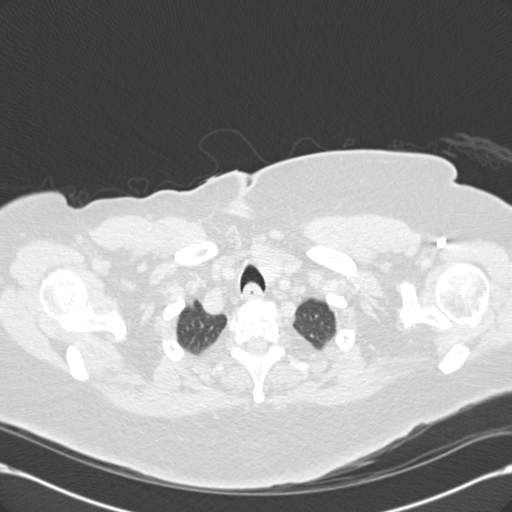
[im 275/288  soft-tissue]
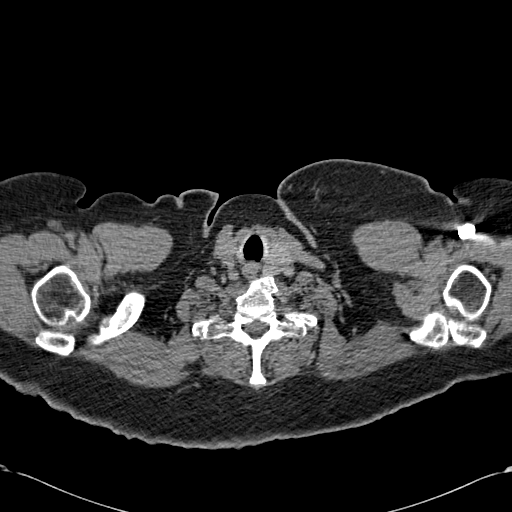

[Series 602: coronal · coronal · 0.70mm/px · 3 of 81 slices shown]
[im 21/81  soft-tissue]
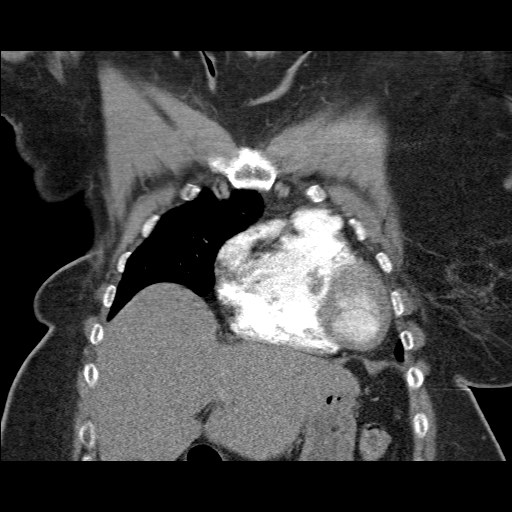
[im 41/81  soft-tissue]
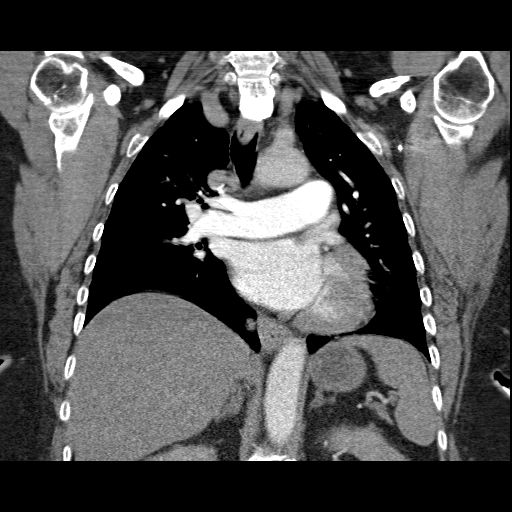
[im 61/81  soft-tissue]
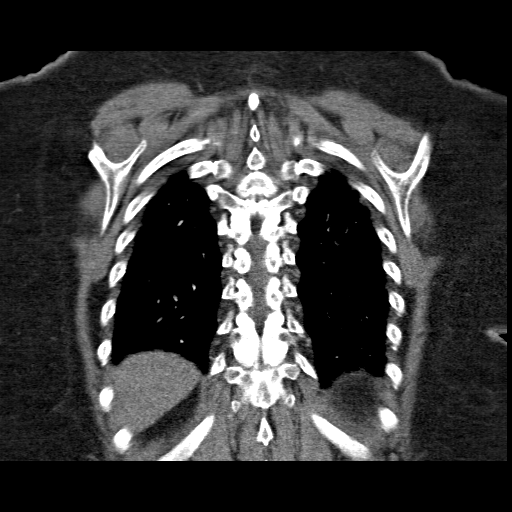

[19 of 46 positions shown; findings below may reference images not displayed]

FINDINGS: There is no pleural effusion.  No airspace consolidation
identified.  No suspicious pulmonary parenchymal nodule or mass
noted.

Right paratracheal lymph node measures 9 mm, image 23.  Heart size
is normal.  No pericardial effusion.  Main PA measures 4 cm in
diameter, image 38.  This is increased in caliber.  There are no
abnormal filling defects identified within the main pulmonary
artery or its branches to suggest an acute pulmonary embolus.
Imaging through the upper abdomen is unremarkable.

Mild spondylosis noted within the thoracic spine.
IMPRESSION: 1.  No acute findings identified.  No evidence for pulmonary
embolus.
2.  Increased caliber of the main pulmonary artery which suggest PA
hypertension.

## 2014-07-18 ENCOUNTER — Other Ambulatory Visit: Payer: Self-pay | Admitting: Family Medicine

## 2014-07-21 ENCOUNTER — Other Ambulatory Visit: Payer: Self-pay

## 2014-07-22 ENCOUNTER — Other Ambulatory Visit: Payer: Self-pay | Admitting: Family Medicine

## 2014-07-22 DIAGNOSIS — N644 Mastodynia: Secondary | ICD-10-CM

## 2014-07-25 ENCOUNTER — Other Ambulatory Visit: Payer: Commercial Managed Care - HMO

## 2014-07-25 ENCOUNTER — Other Ambulatory Visit: Payer: Self-pay | Admitting: Family Medicine

## 2014-07-25 DIAGNOSIS — K760 Fatty (change of) liver, not elsewhere classified: Secondary | ICD-10-CM

## 2014-07-25 DIAGNOSIS — IMO0002 Reserved for concepts with insufficient information to code with codable children: Secondary | ICD-10-CM

## 2014-07-25 DIAGNOSIS — Z0000 Encounter for general adult medical examination without abnormal findings: Secondary | ICD-10-CM

## 2014-07-25 DIAGNOSIS — Z79899 Other long term (current) drug therapy: Secondary | ICD-10-CM

## 2014-07-25 DIAGNOSIS — E039 Hypothyroidism, unspecified: Secondary | ICD-10-CM

## 2014-07-25 DIAGNOSIS — I10 Essential (primary) hypertension: Secondary | ICD-10-CM

## 2014-07-25 DIAGNOSIS — E1165 Type 2 diabetes mellitus with hyperglycemia: Secondary | ICD-10-CM

## 2014-07-28 DIAGNOSIS — I10 Essential (primary) hypertension: Secondary | ICD-10-CM | POA: Diagnosis not present

## 2014-07-28 DIAGNOSIS — E039 Hypothyroidism, unspecified: Secondary | ICD-10-CM | POA: Diagnosis not present

## 2014-07-28 DIAGNOSIS — Z0000 Encounter for general adult medical examination without abnormal findings: Secondary | ICD-10-CM | POA: Diagnosis not present

## 2014-07-28 DIAGNOSIS — E1165 Type 2 diabetes mellitus with hyperglycemia: Secondary | ICD-10-CM | POA: Diagnosis not present

## 2014-07-28 DIAGNOSIS — Z79899 Other long term (current) drug therapy: Secondary | ICD-10-CM | POA: Diagnosis not present

## 2014-07-28 DIAGNOSIS — K760 Fatty (change of) liver, not elsewhere classified: Secondary | ICD-10-CM | POA: Diagnosis not present

## 2014-07-28 DIAGNOSIS — E119 Type 2 diabetes mellitus without complications: Secondary | ICD-10-CM | POA: Diagnosis not present

## 2014-07-29 ENCOUNTER — Encounter: Payer: Self-pay | Admitting: Family Medicine

## 2014-07-29 ENCOUNTER — Encounter (HOSPITAL_COMMUNITY): Payer: Self-pay | Admitting: Emergency Medicine

## 2014-07-29 ENCOUNTER — Ambulatory Visit (INDEPENDENT_AMBULATORY_CARE_PROVIDER_SITE_OTHER): Payer: Commercial Managed Care - HMO | Admitting: Family Medicine

## 2014-07-29 ENCOUNTER — Emergency Department (HOSPITAL_COMMUNITY): Payer: Commercial Managed Care - HMO

## 2014-07-29 ENCOUNTER — Emergency Department (HOSPITAL_COMMUNITY)
Admission: EM | Admit: 2014-07-29 | Discharge: 2014-07-29 | Disposition: A | Discharge status: Home / Self Care | Payer: Commercial Managed Care - HMO | Attending: Emergency Medicine | Admitting: Emergency Medicine

## 2014-07-29 VITALS — BP 110/64 | HR 80 | Temp 98.4°F | Resp 18 | Ht 65.0 in | Wt 204.0 lb

## 2014-07-29 DIAGNOSIS — Z87442 Personal history of urinary calculi: Secondary | ICD-10-CM | POA: Insufficient documentation

## 2014-07-29 DIAGNOSIS — Z880 Allergy status to penicillin: Secondary | ICD-10-CM | POA: Insufficient documentation

## 2014-07-29 DIAGNOSIS — E039 Hypothyroidism, unspecified: Secondary | ICD-10-CM | POA: Insufficient documentation

## 2014-07-29 DIAGNOSIS — R0789 Other chest pain: Secondary | ICD-10-CM

## 2014-07-29 DIAGNOSIS — I10 Essential (primary) hypertension: Secondary | ICD-10-CM | POA: Diagnosis not present

## 2014-07-29 DIAGNOSIS — Z794 Long term (current) use of insulin: Secondary | ICD-10-CM | POA: Diagnosis not present

## 2014-07-29 DIAGNOSIS — Z853 Personal history of malignant neoplasm of breast: Secondary | ICD-10-CM | POA: Diagnosis not present

## 2014-07-29 DIAGNOSIS — M1990 Unspecified osteoarthritis, unspecified site: Secondary | ICD-10-CM | POA: Insufficient documentation

## 2014-07-29 DIAGNOSIS — Z8719 Personal history of other diseases of the digestive system: Secondary | ICD-10-CM | POA: Diagnosis not present

## 2014-07-29 DIAGNOSIS — Z7982 Long term (current) use of aspirin: Secondary | ICD-10-CM | POA: Insufficient documentation

## 2014-07-29 DIAGNOSIS — G43909 Migraine, unspecified, not intractable, without status migrainosus: Secondary | ICD-10-CM | POA: Diagnosis not present

## 2014-07-29 DIAGNOSIS — I209 Angina pectoris, unspecified: Secondary | ICD-10-CM | POA: Insufficient documentation

## 2014-07-29 DIAGNOSIS — G629 Polyneuropathy, unspecified: Secondary | ICD-10-CM | POA: Insufficient documentation

## 2014-07-29 DIAGNOSIS — G8929 Other chronic pain: Secondary | ICD-10-CM | POA: Insufficient documentation

## 2014-07-29 DIAGNOSIS — Z9011 Acquired absence of right breast and nipple: Secondary | ICD-10-CM | POA: Diagnosis not present

## 2014-07-29 DIAGNOSIS — Z791 Long term (current) use of non-steroidal anti-inflammatories (NSAID): Secondary | ICD-10-CM | POA: Diagnosis not present

## 2014-07-29 DIAGNOSIS — R079 Chest pain, unspecified: Secondary | ICD-10-CM | POA: Diagnosis not present

## 2014-07-29 DIAGNOSIS — E119 Type 2 diabetes mellitus without complications: Secondary | ICD-10-CM | POA: Insufficient documentation

## 2014-07-29 DIAGNOSIS — Z91040 Latex allergy status: Secondary | ICD-10-CM | POA: Insufficient documentation

## 2014-07-29 DIAGNOSIS — Z79899 Other long term (current) drug therapy: Secondary | ICD-10-CM | POA: Insufficient documentation

## 2014-07-29 LAB — CBC
HCT: 41.7 % (ref 36.0–46.0)
Hemoglobin: 13.6 g/dL (ref 12.0–15.0)
MCH: 29.1 pg (ref 26.0–34.0)
MCHC: 32.6 g/dL (ref 30.0–36.0)
MCV: 89.3 fL (ref 78.0–100.0)
Platelets: 251 10*3/uL (ref 150–400)
RBC: 4.67 MIL/uL (ref 3.87–5.11)
RDW: 14 % (ref 11.5–15.5)
WBC: 7 10*3/uL (ref 4.0–10.5)

## 2014-07-29 LAB — LIPID PANEL
CHOL/HDL RATIO: 3.9 ratio
CHOLESTEROL: 162 mg/dL (ref 0–200)
HDL: 42 mg/dL — ABNORMAL LOW (ref >=46)
LDL Cholesterol: 87 mg/dL (ref 0–99)
Triglycerides: 163 mg/dL — ABNORMAL HIGH (ref <150)
VLDL: 33 mg/dL (ref 0–40)

## 2014-07-29 LAB — COMPREHENSIVE METABOLIC PANEL
ALT: 24 U/L (ref 0–35)
AST: 28 U/L (ref 0–37)
Albumin: 3.8 g/dL (ref 3.5–5.2)
Alkaline Phosphatase: 55 U/L (ref 39–117)
Anion gap: 9 (ref 5–15)
BILIRUBIN TOTAL: 0.4 mg/dL (ref 0.3–1.2)
BUN: 18 mg/dL (ref 6–23)
CO2: 23 mmol/L (ref 19–32)
Calcium: 10.4 mg/dL (ref 8.4–10.5)
Chloride: 107 mmol/L (ref 96–112)
Creatinine, Ser: 0.64 mg/dL (ref 0.50–1.10)
Glucose, Bld: 151 mg/dL — ABNORMAL HIGH (ref 70–99)
Potassium: 4.3 mmol/L (ref 3.5–5.1)
Sodium: 139 mmol/L (ref 135–145)
Total Protein: 7.4 g/dL (ref 6.0–8.3)

## 2014-07-29 LAB — TSH: TSH: 2.382 u[IU]/mL (ref 0.350–4.500)

## 2014-07-29 LAB — COMPLETE METABOLIC PANEL WITH GFR
ALBUMIN: 3.9 g/dL (ref 3.5–5.2)
ALT: 18 U/L (ref 0–35)
AST: 20 U/L (ref 0–37)
Alkaline Phosphatase: 55 U/L (ref 39–117)
BUN: 15 mg/dL (ref 6–23)
CALCIUM: 10.1 mg/dL (ref 8.4–10.5)
CHLORIDE: 104 meq/L (ref 96–112)
CO2: 27 mEq/L (ref 19–32)
Creat: 0.58 mg/dL (ref 0.50–1.10)
GFR, Est African American: >89 mL/min
GFR, Est Non African American: >89 mL/min
Glucose, Bld: 158 mg/dL — ABNORMAL HIGH (ref 70–99)
POTASSIUM: 4.2 meq/L (ref 3.5–5.3)
Sodium: 138 mEq/L (ref 135–145)
Total Bilirubin: 0.6 mg/dL (ref 0.2–1.2)
Total Protein: 7.4 g/dL (ref 6.0–8.3)

## 2014-07-29 LAB — CBC WITH DIFFERENTIAL/PLATELET
Basophils Absolute: 0.1 10*3/uL (ref 0.0–0.1)
Basophils Relative: 1 % (ref 0–1)
EOS PCT: 4 % (ref 0–5)
Eosinophils Absolute: 0.3 10*3/uL (ref 0.0–0.7)
HCT: 41.1 % (ref 36.0–46.0)
HEMOGLOBIN: 13 g/dL (ref 12.0–15.0)
Lymphocytes Relative: 41 % (ref 12–46)
Lymphs Abs: 2.6 10*3/uL (ref 0.7–4.0)
MCH: 28.4 pg (ref 26.0–34.0)
MCHC: 31.6 g/dL (ref 30.0–36.0)
MCV: 89.9 fL (ref 78.0–100.0)
MONO ABS: 0.4 10*3/uL (ref 0.1–1.0)
MPV: 9.6 fL (ref 8.6–12.4)
Monocytes Relative: 7 % (ref 3–12)
Neutro Abs: 3 10*3/uL (ref 1.7–7.7)
Neutrophils Relative %: 47 % (ref 43–77)
Platelets: 283 10*3/uL (ref 150–400)
RBC: 4.57 MIL/uL (ref 3.87–5.11)
RDW: 14.3 % (ref 11.5–15.5)
WBC: 6.4 10*3/uL (ref 4.0–10.5)

## 2014-07-29 LAB — PROTIME-INR
INR: 1.01 (ref 0.00–1.49)
PROTHROMBIN TIME: 13.4 s (ref 11.6–15.2)

## 2014-07-29 LAB — TROPONIN I

## 2014-07-29 LAB — HEMOGLOBIN A1C
HEMOGLOBIN A1C: 9.5 % — AB (ref <5.7)
Mean Plasma Glucose: 226 mg/dL — ABNORMAL HIGH (ref <117)

## 2014-07-29 LAB — MAGNESIUM: Magnesium: 1.8 mg/dL (ref 1.5–2.5)

## 2014-07-29 MED ORDER — MORPHINE SULFATE 4 MG/ML IJ SOLN: 4.0000 mg | Freq: Once | INTRAMUSCULAR | Status: DC

## 2014-07-29 MED ORDER — NITROGLYCERIN 0.3 MG SL SUBL: 0.4000 mg | SUBLINGUAL_TABLET | SUBLINGUAL | Status: DC | PRN

## 2014-07-29 MED ORDER — ASPIRIN 81 MG PO CHEW: 324.0000 mg | CHEWABLE_TABLET | Freq: Once | ORAL | Status: DC

## 2014-07-29 MED ORDER — MORPHINE SULFATE 4 MG/ML IJ SOLN
4.0000 mg | Freq: Once | INTRAMUSCULAR | Status: AC
  Administered 2014-07-29: 4 mg via INTRAVENOUS
  Filled 2014-07-29: qty 1

## 2014-07-29 MED ORDER — OXYCODONE-ACETAMINOPHEN 5-325 MG PO TABS: 1.0000 | ORAL_TABLET | Freq: Four times a day (QID) | ORAL | Status: DC | PRN

## 2014-07-29 NOTE — Progress Notes (Signed)
Subjective:    Patient ID: [name], female    DOB: 1950/06/27, 64 y.o.   MRN: [id number]  HPI  Patient is a 64 year old African-American female. Please see my last office visit in January. Patient is taking 45 units of Lantus twice daily but that is it. She is noncompliant taking her NovoLog.  When she checks her blood sugar, she is only checking in the morning. Her blood sugars typically 200-250 in the morning. Her most recent lab work as listed below: Appointment on 07/25/2014  Component Date Value Ref Range Status  . WBC 07/28/2014 6.4  4.0 - 10.5 K/uL Final  . RBC 07/28/2014 4.57  3.87 - 5.11 MIL/uL Final  . Hemoglobin 07/28/2014 13.0  12.0 - 15.0 g/dL Final  . HCT 07/28/2014 41.1  36.0 - 46.0 % Final  . MCV 07/28/2014 89.9  78.0 - 100.0 fL Final  . MCH 07/28/2014 28.4  26.0 - 34.0 pg Final  . MCHC 07/28/2014 31.6  30.0 - 36.0 g/dL Final  . RDW 07/28/2014 14.3  11.5 - 15.5 % Final  . Platelets 07/28/2014 283  150 - 400 K/uL Final  . MPV 07/28/2014 9.6  8.6 - 12.4 fL Final  . Neutrophils Relative % 07/28/2014 47  43 - 77 % Final  . Neutro Abs 07/28/2014 3.0  1.7 - 7.7 K/uL Final  . Lymphocytes Relative 07/28/2014 41  12 - 46 % Final  . Lymphs Abs 07/28/2014 2.6  0.7 - 4.0 K/uL Final  . Monocytes Relative 07/28/2014 7  3 - 12 % Final  . Monocytes Absolute 07/28/2014 0.4  0.1 - 1.0 K/uL Final  . Eosinophils Relative 07/28/2014 4  0 - 5 % Final  . Eosinophils Absolute 07/28/2014 0.3  0.0 - 0.7 K/uL Final  . Basophils Relative 07/28/2014 1  0 - 1 % Final  . Basophils Absolute 07/28/2014 0.1  0.0 - 0.1 K/uL Final  . Smear Review 07/28/2014 Criteria for review not met   Final  . Cholesterol 07/28/2014 162  0 - 200 mg/dL Final   Comment: ATP III Classification:       < 200        mg/dL        Desirable      200 - 239     mg/dL        Borderline High      >= 240        mg/dL        High     . Triglycerides 07/28/2014 163* <150 mg/dL Final  . HDL 07/28/2014 42* >=46 mg/dL  Final   ** Please note change in reference range(s). **  . Total CHOL/HDL Ratio 07/28/2014 3.9   Final  . VLDL 07/28/2014 33  0 - 40 mg/dL Final  . LDL Cholesterol 07/28/2014 87  0 - 99 mg/dL Final   Comment:   Total Cholesterol/HDL Ratio:CHD Risk                        Coronary Heart Disease Risk Table                                        Men       Women          1/2 Average Risk  3.4        3.3              Average Risk              5.0        4.4           2X Average Risk              9.6        7.1           3X Average Risk             23.4       11.0 Use the calculated Patient Ratio above and the CHD Risk table  to determine the patient's CHD Risk. ATP III Classification (LDL):       < 100        mg/dL         Optimal      100 - 129     mg/dL         Near or Above Optimal      130 - 159     mg/dL         Borderline High      160 - 189     mg/dL         High       > 190        mg/dL         Very High     . TSH 07/28/2014 2.382  0.350 - 4.500 uIU/mL Final  . Sodium 07/28/2014 138  135 - 145 mEq/L Final  . Potassium 07/28/2014 4.2  3.5 - 5.3 mEq/L Final  . Chloride 07/28/2014 104  96 - 112 mEq/L Final  . CO2 07/28/2014 27  19 - 32 mEq/L Final  . Glucose, Bld 07/28/2014 158* 70 - 99 mg/dL Final  . BUN 07/28/2014 15  6 - 23 mg/dL Final  . Creat 07/28/2014 0.58  0.50 - 1.10 mg/dL Final  . Total Bilirubin 07/28/2014 0.6  0.2 - 1.2 mg/dL Final  . Alkaline Phosphatase 07/28/2014 55  39 - 117 U/L Final  . AST 07/28/2014 20  0 - 37 U/L Final  . ALT 07/28/2014 18  0 - 35 U/L Final  . Total Protein 07/28/2014 7.4  6.0 - 8.3 g/dL Final  . Albumin 07/28/2014 3.9  3.5 - 5.2 g/dL Final  . Calcium 07/28/2014 10.1  8.4 - 10.5 mg/dL Final  . GFR, Est African American 07/28/2014 >89   Final  . GFR, Est Non African American 07/28/2014 >89   Final   Comment:   The estimated GFR is a calculation valid for adults (>=39 years old) that uses the CKD-EPI algorithm to adjust for age  and sex. It is   not to be used for children, pregnant women, hospitalized patients,    patients on dialysis, or with rapidly changing kidney function. According to the NKDEP, eGFR >89 is normal, 60-89 shows mild impairment, 30-59 shows moderate impairment, 15-29 shows severe impairment and <15 is ESRD.     [name] Kitchen Hgb A1c MFr Bld 07/28/2014 9.5* <5.7 % Final   Comment:  According to the ADA Clinical Practice Recommendations for 2011, when HbA1c is used as a screening test:     >=6.5%   Diagnostic of Diabetes Mellitus            (if abnormal result is confirmed)   5.7-6.4%   Increased risk of developing Diabetes Mellitus   References:Diagnosis and Classification of Diabetes Mellitus,Diabetes BBCW,8889,16(XIHWT 1):S62-S69 and Standards of Medical Care in         Diabetes - 2011,Diabetes UUEK,8003,49 (Suppl 1):S11-S61.     . Mean Plasma Glucose 07/28/2014 226* <117 mg/dL Final   The patient is requesting surgical clearance for back surgery. I refuse to provide surgical clearance until we were able to control her blood sugars better. Therefore she is here today for examination. However more concerning today, the patient reports substernal chest tightness that is 8 on a scale of 1-10. It radiates into the center of her back. It has been occurring episodically over the last 2 weeks. It is a tightness and heaviness in the center of her chest. She denies shortness of breath although she did try taking her husband's albuterol recently due to the severity of the tightness. She denies any relationship of the pain to exertion. EKG today in office shows no significant change compared to her EKG from October 2015. There is no overt ischemia or infarction. At 1050 I gave the patient 0.4 mg sublingual nitroglycerin to see if the patient's chest pain would improve. Also gave the patient 4, 81 mg aspirin and ask her to chew these.   Past  Medical History  Diagnosis Date  . GERD (gastroesophageal reflux disease)   . Hypothyroidism   . Peripheral neuropathy   . Cardiomyopathy   . Chest pain 06/2005    Hospitalized, dystolic dysfunction  . Sciatica   . Type II diabetes mellitus   . PONV (postoperative nausea and vomiting)   . H/O hiatal hernia   . Migraines     "it's been a long time since I've had one" (03/20/2012)  . Osteoarthritis   . Arthritis     "all over; mostly in my back" (03/20/2012)  . Chronic lower back pain     "have 2 herniated discs; going to have to have a fusion" (03/20/2012)  . Kidney stones     "never had OR" (03/20/2012)  . Breast cancer     "right" (03/20/2012)  . Family history of anesthesia complication     "father has nausea and vomiting"  . Irritable bowel syndrome   . Hypertension     "patient states never had HTN, takes Hyzaar for heart  . Anginal pain 03/19/2012    saw Dr. Cathie Olden .. she thinks its more related to stomach issues  . History of right mastectomy    Past Surgical History  Procedure Laterality Date  . Abdominal hysterectomy  1993  . Total knee arthroplasty  2000    Right  . Mastectomy with axillary lymph node dissection  12/2003    "right" (03/20/2012)  . Thyroidectomy  1977    Right  . Posterior cervical fusion/foraminotomy  2001  . Shoulder arthroscopy w/ rotator cuff repair  2003    Left  . Cardiac catheterization  ? 2005 / 2007  . Adenosin cardiolite  07/2005    (+) wall motion abnormality  . Esophagogastroduodenoscopy  2005    GERD  . [place] of abdomen  07/2005    Fatty liver / kidney stones  . Ct abdomen and pelvis  02/2010  Same  . Knee arthroscopy  1990's    "right" (03/20/2012)  . Axillary lymph node dissection  2003    squamous cell cancer  . Back surgery    . Lumbar laminectomy/decompression microdiscectomy N/A 02/04/2014    Procedure: RIGHT L2-3 MICRODISCECTOMY ;  Surgeon: Jessy Oto, MD;  Location: [facility];  Service: Orthopedics;  Laterality:  N/A;   Current Outpatient Prescriptions on File Prior to Visit  Medication Sig Dispense Refill  . acetaminophen (TYLENOL) 325 MG tablet Take 650 mg by mouth every 6 (six) hours as needed for moderate pain.    [name] Kitchen aspirin 81 MG tablet Take 81 mg by mouth daily.    . Cholecalciferol (VITAMIN D) 2000 UNITS CAPS Take 4,000 Units by mouth daily.    [name] Kitchen docusate sodium 100 MG CAPS Take 100 mg by mouth 2 (two) times daily. 10 capsule   . gabapentin (NEURONTIN) 300 MG capsule Take 1 capsule (300 mg total) by mouth 3 (three) times daily. 90 capsule 3  . ibuprofen (ADVIL,MOTRIN) 200 MG tablet Take 600 mg by mouth every 4 (four) hours as needed for moderate pain.    [name] Kitchen insulin aspart (NOVOLOG) 100 UNIT/ML FlexPen Inject 15 Units into the skin 3 (three) times daily with meals. 5 pen 11  . insulin glargine (LANTUS) 100 unit/mL SOPN Inject 0.45 mLs (45 Units total) into the skin 2 (two) times daily. 15 mL 5  . Insulin Pen Needle (PEN NEEDLES 5/16") 30G X 8 MM MISC Use to inject insulin 5x daily. 200 each 11  . levothyroxine (SYNTHROID, LEVOTHROID) 100 MCG tablet Take 1 tablet (100 mcg total) by mouth daily. 30 tablet 11  . losartan-hydrochlorothiazide (HYZAAR) 50-12.5 MG per tablet Take 1 tablet by mouth daily. 30 tablet 11  . metFORMIN (GLUCOPHAGE) 850 MG tablet Take 1 tablet (850 mg total) by mouth 2 (two) times daily with a meal. 60 tablet 3  . methocarbamol (ROBAXIN) 500 MG tablet Take 1 tablet (500 mg total) by mouth every 6 (six) hours as needed for muscle spasms. 40 tablet 1  . nitroGLYCERIN (NITROSTAT) 0.4 MG SL tablet Place 0.4 mg under the tongue every 5 (five) minutes as needed for chest pain.     [name] Kitchen oxyCODONE-acetaminophen (PERCOCET/ROXICET) 5-325 MG per tablet Take 1-2 tablets by mouth every 4 (four) hours as needed for moderate pain. 60 tablet 0  . polyethylene glycol (MIRALAX / GLYCOLAX) packet Take 17 g by mouth daily as needed (constipation).     . promethazine (PHENERGAN) 25 MG tablet Take 1 tablet  (25 mg total) by mouth every 8 (eight) hours as needed for nausea or vomiting. 20 tablet 2   No current facility-administered medications on file prior to visit.   Allergies  Allergen Reactions  . Dilaudid [Hydromorphone Hcl] Nausea And Vomiting  . Ampicillin Rash    REACTION: Rash and welts on her hands when she took it in the hospital  . Latex Itching, Dermatitis and Rash    REACTION: Patient had a reaction to tape after surgery and they told her she was allergic to Latex 03/20/2012 "if it's on too long it will break me out"  . Penicillins Rash   History   Social History  . Marital Status: Married    Spouse Name: N/A  . Number of Children: 1  . Years of Education: N/A   Occupational History  . Retired since 2003    Social History Main Topics  . Smoking status: Never Smoker   . Smokeless tobacco:  Never Used  . Alcohol Use: No  . Drug Use: No  . Sexual Activity: Not on file   Other Topics Concern  . Not on file   Social History Narrative   Family History  Problem Relation Age of Onset  . Hypertension Mother   . Diabetes Father   . Hypertension Father   . Hypertension Sister   . Diabetes Other     Family Hx. of Diabetes  . Hypertension Other     Family History of HTN  . Hypertension Brother   . Hypertension Brother   . Diabetes Brother      Review of Systems  All other systems reviewed and are negative.      Objective:   Physical Exam  Constitutional: She appears well-developed. She appears distressed.  Neck: Neck supple. No thyromegaly present.  Cardiovascular: Normal rate, regular rhythm and normal heart sounds.   No murmur heard. Pulmonary/Chest: Effort normal and breath sounds normal. No respiratory distress. She has no wheezes. She has no rales.  Abdominal: Soft. Bowel sounds are normal. She exhibits no distension. There is no tenderness. There is no rebound and no guarding.  Musculoskeletal: She exhibits no edema.  Lymphadenopathy:    She has  no cervical adenopathy.  Skin: Rash noted.  Vitals reviewed.  patient has scars on her right abdomen that look like a recent case of shingles. However her pain was not in this area.        Assessment & Plan:  Other chest pain - Plan: EKG 12-Lead  Patient's EKG today shows no significant change compared her EKG from October 2015. However given her numerous risk factors including diabetes mellitus type 2, hypertension, obesity, and noncompliance with her diabetes, I am concerned about possible ACS versus unstable angina. I recommended the patient go to the hospital today for further evaluation to rule out acute coronary syndrome. I certainly do not feel comfortable giving this patient surgical clearance until we obtain a stress test to further evaluate her heart and also obtain better control of her fasting blood sugars.  By 1105, the patient's chest pain had improved on the nitroglycerin. She has been trying numerous acid reflux medications over the last week with no relief. She is complaining of tightness in the chest. She has numerous cardiovascular risk factors. I recommended going to the emergency room today for cardiac enzymes to rule out acute coronary syndrome and unstable angina. After this is been performed, we can obtain a stress test to clear her from a cardiovascular standpoint for her upcoming surgery and then focus our attention on better managing her blood sugars but in the meantime we need to rule out acute coronary syndrome

## 2014-07-29 NOTE — ED Notes (Signed)
Per EMS - pt from PCP for evaluation of new onset chest pain x5 hours, radiation to back, associated with nausea denies vomiting. Worse with movement and tender on palpation. Pt denies injury. VSS. Alert and Oriented x4.

## 2014-07-29 NOTE — Addendum Note (Signed)
Addended by: Shary Decamp B on: 07/29/2014 01:05 PM   Modules accepted: Orders

## 2014-07-29 NOTE — Discharge Instructions (Signed)
As discussed, your evaluation today has been largely reassuring.  But, it is important that you monitor your condition carefully, and do not hesitate to return to the ED if you develop new, or concerning changes in your condition. ° °Otherwise, please follow-up with your physician for appropriate ongoing care. ° °Chest Pain (Nonspecific) °It is often hard to give a specific diagnosis for the cause of chest pain. There is always a chance that your pain could be related to something serious, such as a heart attack or a blood clot in the lungs. You need to follow up with your health care provider for further evaluation. °CAUSES  °· Heartburn. °· Pneumonia or bronchitis. °· Anxiety or stress. °· Inflammation around your heart (pericarditis) or lung (pleuritis or pleurisy). °· A blood clot in the lung. °· A collapsed lung (pneumothorax). It can develop suddenly on its own (spontaneous pneumothorax) or from trauma to the chest. °· Shingles infection (herpes zoster virus). °The chest wall is composed of bones, muscles, and cartilage. Any of these can be the source of the pain. °· The bones can be bruised by injury. °· The muscles or cartilage can be strained by coughing or overwork. °· The cartilage can be affected by inflammation and become sore (costochondritis). °DIAGNOSIS  °Lab tests or other studies may be needed to find the cause of your pain. Your health care provider may have you take a test called an ambulatory electrocardiogram (ECG). An ECG records your heartbeat patterns over a 24-hour period. You may also have other tests, such as: °· Transthoracic echocardiogram (TTE). During echocardiography, sound waves are used to evaluate how blood flows through your heart. °· Transesophageal echocardiogram (TEE). °· Cardiac monitoring. This allows your health care provider to monitor your heart rate and rhythm in real time. °· Holter monitor. This is a portable device that records your heartbeat and can help diagnose  heart arrhythmias. It allows your health care provider to track your heart activity for several days, if needed. °· Stress tests by exercise or by giving medicine that makes the heart beat faster. °TREATMENT  °· Treatment depends on what may be causing your chest pain. Treatment may include: °¨ Acid blockers for heartburn. °¨ Anti-inflammatory medicine. °¨ Pain medicine for inflammatory conditions. °¨ Antibiotics if an infection is present. °· You may be advised to change lifestyle habits. This includes stopping smoking and avoiding alcohol, caffeine, and chocolate. °· You may be advised to keep your head raised (elevated) when sleeping. This reduces the chance of acid going backward from your stomach into your esophagus. °Most of the time, nonspecific chest pain will improve within 2-3 days with rest and mild pain medicine.  °HOME CARE INSTRUCTIONS  °· If antibiotics were prescribed, take them as directed. Finish them even if you start to feel better. °· For the next few days, avoid physical activities that bring on chest pain. Continue physical activities as directed. °· Do not use any tobacco products, including cigarettes, chewing tobacco, or electronic cigarettes. °· Avoid drinking alcohol. °· Only take medicine as directed by your health care provider. °· Follow your health care provider's suggestions for further testing if your chest pain does not go away. °· Keep any follow-up appointments you made. If you do not go to an appointment, you could develop lasting (chronic) problems with pain. If there is any problem keeping an appointment, call to reschedule. °SEEK MEDICAL CARE IF:  °· Your chest pain does not go away, even after treatment. °· You have   a rash with blisters on your chest. °· You have a fever. °SEEK IMMEDIATE MEDICAL CARE IF:  °· You have increased chest pain or pain that spreads to your arm, neck, jaw, back, or abdomen. °· You have shortness of breath. °· You have an increasing cough, or you  cough up blood. °· You have severe back or abdominal pain. °· You feel nauseous or vomit. °· You have severe weakness. °· You faint. °· You have chills. °This is an emergency. Do not wait to see if the pain will go away. Get medical help at once. Call your local emergency services (911 in [place] Do not drive yourself to the hospital. °MAKE SURE YOU:  °· Understand these instructions. °· Will watch your condition. °· Will get help right away if you are not doing well or get worse. °Document Released: 01/05/2005 Document Revised: 04/02/2013 Document Reviewed: 11/01/2007 °[employer]® Patient Information ©2015 [employer]. This information is not intended to replace advice given to you by your health care provider. Make sure you discuss any questions you have with your health care provider. ° °

## 2014-07-29 NOTE — ED Notes (Signed)
Pt leaving for x-ray.  ?

## 2014-07-29 NOTE — ED Provider Notes (Signed)
CSN: [id number]     Arrival date & time 07/29/14  1208 History   First MD Initiated Contact with Patient 07/29/14 1220     Chief Complaint  Patient presents with  . Chest Pain    HPI  Patient presents with concern of right-sided chest pain. Pain began about 5 hours ago. Initially the pain is right there is dural, but has radiated to the back. Pain is sore, moderate, otherwise nonradiating. There is no exertional or pleuritic pain. There is nausea, but no vomiting. And no diarrhea. Area is tender to palpation. Patient has been generally well recently, but does have a history of chronic back pain.   Past Medical History  Diagnosis Date  . GERD (gastroesophageal reflux disease)   . Hypothyroidism   . Peripheral neuropathy   . Cardiomyopathy   . Chest pain 06/2005    Hospitalized, dystolic dysfunction  . Sciatica   . Type II diabetes mellitus   . PONV (postoperative nausea and vomiting)   . H/O hiatal hernia   . Migraines     "it's been a long time since I've had one" (03/20/2012)  . Osteoarthritis   . Arthritis     "all over; mostly in my back" (03/20/2012)  . Chronic lower back pain     "have 2 herniated discs; going to have to have a fusion" (03/20/2012)  . Kidney stones     "never had OR" (03/20/2012)  . Breast cancer     "right" (03/20/2012)  . Family history of anesthesia complication     "father has nausea and vomiting"  . Irritable bowel syndrome   . Hypertension     "patient states never had HTN, takes Hyzaar for heart  . Anginal pain 03/19/2012    saw Dr. Cathie Olden .. she thinks its more related to stomach issues  . History of right mastectomy    Past Surgical History  Procedure Laterality Date  . Abdominal hysterectomy  1993  . Total knee arthroplasty  2000    Right  . Mastectomy with axillary lymph node dissection  12/2003    "right" (03/20/2012)  . Thyroidectomy  1977    Right  . Posterior cervical fusion/foraminotomy  2001  . Shoulder arthroscopy w/  rotator cuff repair  2003    Left  . Cardiac catheterization  ? 2005 / 2007  . Adenosin cardiolite  07/2005    (+) wall motion abnormality  . Esophagogastroduodenoscopy  2005    GERD  . [place] of abdomen  07/2005    Fatty liver / kidney stones  . Ct abdomen and pelvis  02/2010    Same  . Knee arthroscopy  1990's    "right" (03/20/2012)  . Axillary lymph node dissection  2003    squamous cell cancer  . Back surgery    . Lumbar laminectomy/decompression microdiscectomy N/A 02/04/2014    Procedure: RIGHT L2-3 MICRODISCECTOMY ;  Surgeon: Jessy Oto, MD;  Location: [facility];  Service: Orthopedics;  Laterality: N/A;   Family History  Problem Relation Age of Onset  . Hypertension Mother   . Diabetes Father   . Hypertension Father   . Hypertension Sister   . Diabetes Other     Family Hx. of Diabetes  . Hypertension Other     Family History of HTN  . Hypertension Brother   . Hypertension Brother   . Diabetes Brother    History  Substance Use Topics  . Smoking status: Never Smoker   . Smokeless tobacco:  Never Used  . Alcohol Use: No   OB History    No data available     Review of Systems  Constitutional:       Per HPI, otherwise negative  HENT:       Per HPI, otherwise negative  Respiratory:       Per HPI, otherwise negative  Cardiovascular:       Per HPI, otherwise negative  Gastrointestinal: Negative for vomiting.  Endocrine:       Negative aside from HPI  Genitourinary:       Neg aside from HPI   Musculoskeletal:       Per HPI, otherwise negative  Skin: Negative.   Neurological: Negative for syncope.      Allergies  Dilaudid; Ampicillin; Latex; and Penicillins  Home Medications   Prior to Admission medications   Medication Sig Start Date End Date Taking? Authorizing Provider  acetaminophen (TYLENOL) 325 MG tablet Take 650 mg by mouth every 6 (six) hours as needed for moderate pain.    Historical Provider, MD  aspirin 81 MG tablet Take 81 mg by mouth  daily.    Historical Provider, MD  Cholecalciferol (VITAMIN D) 2000 UNITS CAPS Take 4,000 Units by mouth daily.    Historical Provider, MD  docusate sodium 100 MG CAPS Take 100 mg by mouth 2 (two) times daily. 05/04/12   Phillips Hay, PA-C  gabapentin (NEURONTIN) 300 MG capsule Take 1 capsule (300 mg total) by mouth 3 (three) times daily. 01/09/14   Susy Frizzle, MD  ibuprofen (ADVIL,MOTRIN) 200 MG tablet Take 600 mg by mouth every 4 (four) hours as needed for moderate pain.    Historical Provider, MD  insulin aspart (NOVOLOG) 100 UNIT/ML FlexPen Inject 15 Units into the skin 3 (three) times daily with meals. 04/24/14   Susy Frizzle, MD  insulin glargine (LANTUS) 100 unit/mL SOPN Inject 0.45 mLs (45 Units total) into the skin 2 (two) times daily. 04/24/14   Susy Frizzle, MD  Insulin Pen Needle (PEN NEEDLES 5/16") 30G X 8 MM MISC Use to inject insulin 5x daily. 12/31/13   Susy Frizzle, MD  levothyroxine (SYNTHROID, LEVOTHROID) 100 MCG tablet Take 1 tablet (100 mcg total) by mouth daily. 10/29/13   Susy Frizzle, MD  losartan-hydrochlorothiazide (HYZAAR) 50-12.5 MG per tablet Take 1 tablet by mouth daily. 10/29/13   Susy Frizzle, MD  meloxicam (MOBIC) 15 MG tablet Take 15 mg by mouth daily.  05/07/14   Historical Provider, MD  metFORMIN (GLUCOPHAGE) 850 MG tablet Take 1 tablet (850 mg total) by mouth 2 (two) times daily with a meal. 10/29/13   Susy Frizzle, MD  methocarbamol (ROBAXIN) 500 MG tablet Take 1 tablet (500 mg total) by mouth every 6 (six) hours as needed for muscle spasms. 02/05/14   Jessy Oto, MD  nitroGLYCERIN (NITROSTAT) 0.4 MG SL tablet Place 0.4 mg under the tongue every 5 (five) minutes as needed for chest pain.     Historical Provider, MD  oxyCODONE-acetaminophen (PERCOCET/ROXICET) 5-325 MG per tablet Take 1-2 tablets by mouth every 4 (four) hours as needed for moderate pain. 02/05/14   Jessy Oto, MD  polyethylene glycol [facility] / Floria Raveling) packet Take 17 g  by mouth daily as needed (constipation).     Historical Provider, MD  promethazine (PHENERGAN) 25 MG tablet Take 1 tablet (25 mg total) by mouth every 8 (eight) hours as needed for nausea or vomiting. 02/14/14   Alycia Rossetti, MD  BP 108/67 mmHg  Pulse 70  Temp(Src) 98.4 F (36.9 C) (Oral)  Resp 13  SpO2 97% Physical Exam  Constitutional: She is oriented to person, place, and time. She appears well-developed and well-nourished. No distress.  HENT:  Head: Normocephalic and atraumatic.  Eyes: Conjunctivae and EOM are normal.  Cardiovascular: Normal rate and regular rhythm.   Pulmonary/Chest: Effort normal and breath sounds normal. No stridor. No respiratory distress.  Minimal tenderness to palpation about the right anterior chest wall.  Abdominal: She exhibits no distension.  Musculoskeletal: She exhibits no edema.  Midline scar - vertically oriented wnl  Neurological: She is alert and oriented to person, place, and time. No cranial nerve deficit.  Skin: Skin is warm and dry.  Psychiatric: She has a normal mood and affect.  Nursing note and vitals reviewed.   ED Course  Procedures (including critical care time) Labs Review Labs Reviewed  CBC  COMPREHENSIVE METABOLIC PANEL  MAGNESIUM  PROTIME-INR  TROPONIN I    Imaging Review No results found.   EKG Interpretation   Date/Time:  Tuesday July 29 2014 12:15:54 EDT Ventricular Rate:  72 PR Interval:  185 QRS Duration: 110 QT Interval:  421 QTC Calculation: 461 R Axis:   -12 Text Interpretation:  Sinus rhythm Incomplete left bundle branch block  Probable left ventricular hypertrophy Minimal ST elevation, lateral leads  Sinus rhythm Artifact Non-specific intra-ventricular conduction delay T  wave abnormality Abnormal ekg Confirmed by Carmin Muskrat  MD ([id number]) on  07/29/2014 12:23:44 PM     Cardiac 70 sinus rhythm normal Pulse ox 100% room air normal   3:34 PM Patient ambulatory, in NAD.  We discussed all  findings at length, including reassuring labs, vitals.  MDM   Final diagnoses:  Atypical chest pain   Patient presents with new right-sided chest pain, back pain. Patient is hemodynamically stable, in no distress throughout hours of monitoring here. Patient's evaluation does not suggest either atypical ACS, nor aortic disruption. No evidence for neurovascular compromise per No evidence for infection. Patient had improvement here, was discharged in stable condition to follow up with her primary care physician.   Carmin Muskrat, MD 07/29/14 1535

## 2014-07-29 NOTE — ED Notes (Signed)
Pt received 1 nitro and 324 at PCP, also received 2 nitro en route by EMS. Pain unrelieved with nitro. Pain has remained 8/10 entire time.

## 2014-07-31 DIAGNOSIS — Z853 Personal history of malignant neoplasm of breast: Secondary | ICD-10-CM | POA: Diagnosis not present

## 2014-07-31 DIAGNOSIS — R921 Mammographic calcification found on diagnostic imaging of breast: Secondary | ICD-10-CM | POA: Diagnosis not present

## 2014-07-31 LAB — HM MAMMOGRAPHY: HM Mammogram: NORMAL

## 2014-08-04 ENCOUNTER — Encounter: Payer: Self-pay | Admitting: Family Medicine

## 2014-08-04 ENCOUNTER — Ambulatory Visit (INDEPENDENT_AMBULATORY_CARE_PROVIDER_SITE_OTHER): Payer: Commercial Managed Care - HMO | Admitting: Family Medicine

## 2014-08-04 VITALS — BP 140/80 | HR 68 | Temp 98.5°F | Resp 18 | Ht 65.0 in | Wt 205.0 lb

## 2014-08-04 DIAGNOSIS — E1165 Type 2 diabetes mellitus with hyperglycemia: Secondary | ICD-10-CM

## 2014-08-04 DIAGNOSIS — IMO0002 Reserved for concepts with insufficient information to code with codable children: Secondary | ICD-10-CM

## 2014-08-04 NOTE — Progress Notes (Signed)
Subjective:    Patient ID: [name], female    DOB: [birth year], 64 y.o.   MRN: [id number]  HPI  07/29/14 Patient is a 64 year old African-American female. Please see my last office visit in January. Patient is taking 45 units of Lantus twice daily but that is it. She is noncompliant taking her NovoLog.  When she checks her blood sugar, she is only checking in the morning. Her blood sugars typically 200-250 in the morning. Her most recent lab work as listed below: Admission on 07/29/2014, Discharged on 07/29/2014  Component Date Value Ref Range Status  . WBC 07/29/2014 7.0  4.0 - 10.5 K/uL Final  . RBC 07/29/2014 4.67  3.87 - 5.11 MIL/uL Final  . Hemoglobin 07/29/2014 13.6  12.0 - 15.0 g/dL Final  . HCT 07/29/2014 41.7  36.0 - 46.0 % Final  . MCV 07/29/2014 89.3  78.0 - 100.0 fL Final  . MCH 07/29/2014 29.1  26.0 - 34.0 pg Final  . MCHC 07/29/2014 32.6  30.0 - 36.0 g/dL Final  . RDW 07/29/2014 14.0  11.5 - 15.5 % Final  . Platelets 07/29/2014 251  150 - 400 K/uL Final  . Sodium 07/29/2014 139  135 - 145 mmol/L Final  . Potassium 07/29/2014 4.3  3.5 - 5.1 mmol/L Final  . Chloride 07/29/2014 107  96 - 112 mmol/L Final  . CO2 07/29/2014 23  19 - 32 mmol/L Final  . Glucose, Bld 07/29/2014 151* 70 - 99 mg/dL Final  . BUN 07/29/2014 18  6 - 23 mg/dL Final  . Creatinine, Ser 07/29/2014 0.64  0.50 - 1.10 mg/dL Final  . Calcium 07/29/2014 10.4  8.4 - 10.5 mg/dL Final  . Total Protein 07/29/2014 7.4  6.0 - 8.3 g/dL Final  . Albumin 07/29/2014 3.8  3.5 - 5.2 g/dL Final  . AST 07/29/2014 28  0 - 37 U/L Final  . ALT 07/29/2014 24  0 - 35 U/L Final  . Alkaline Phosphatase 07/29/2014 55  39 - 117 U/L Final  . Total Bilirubin 07/29/2014 0.4  0.3 - 1.2 mg/dL Final  . GFR calc non Af Amer 07/29/2014 >90  >90 mL/min Final  . GFR calc Af Amer 07/29/2014 >90  >90 mL/min Final   Comment: (NOTE) The eGFR has been calculated using the CKD EPI equation. This calculation has not been validated in  all clinical situations. eGFR's persistently <90 mL/min signify possible Chronic Kidney Disease.   . Anion gap 07/29/2014 9  5 - 15 Final  . Magnesium 07/29/2014 1.8  1.5 - 2.5 mg/dL Final  . Prothrombin Time 07/29/2014 13.4  11.6 - 15.2 seconds Final  . INR 07/29/2014 1.01  0.00 - 1.49 Final  . Troponin I 07/29/2014 <0.03  <0.031 ng/mL Final   Comment:        NO INDICATION OF MYOCARDIAL INJURY.   Appointment on 07/25/2014  Component Date Value Ref Range Status  . WBC 07/28/2014 6.4  4.0 - 10.5 K/uL Final  . RBC 07/28/2014 4.57  3.87 - 5.11 MIL/uL Final  . Hemoglobin 07/28/2014 13.0  12.0 - 15.0 g/dL Final  . HCT 07/28/2014 41.1  36.0 - 46.0 % Final  . MCV 07/28/2014 89.9  78.0 - 100.0 fL Final  . MCH 07/28/2014 28.4  26.0 - 34.0 pg Final  . MCHC 07/28/2014 31.6  30.0 - 36.0 g/dL Final  . RDW 07/28/2014 14.3  11.5 - 15.5 % Final  . Platelets 07/28/2014 283  150 - 400 K/uL Final  .  MPV 07/28/2014 9.6  8.6 - 12.4 fL Final  . Neutrophils Relative % 07/28/2014 47  43 - 77 % Final  . Neutro Abs 07/28/2014 3.0  1.7 - 7.7 K/uL Final  . Lymphocytes Relative 07/28/2014 41  12 - 46 % Final  . Lymphs Abs 07/28/2014 2.6  0.7 - 4.0 K/uL Final  . Monocytes Relative 07/28/2014 7  3 - 12 % Final  . Monocytes Absolute 07/28/2014 0.4  0.1 - 1.0 K/uL Final  . Eosinophils Relative 07/28/2014 4  0 - 5 % Final  . Eosinophils Absolute 07/28/2014 0.3  0.0 - 0.7 K/uL Final  . Basophils Relative 07/28/2014 1  0 - 1 % Final  . Basophils Absolute 07/28/2014 0.1  0.0 - 0.1 K/uL Final  . Smear Review 07/28/2014 Criteria for review not met   Final  . Cholesterol 07/28/2014 162  0 - 200 mg/dL Final   Comment: ATP III Classification:       < 200        mg/dL        Desirable      200 - 239     mg/dL        Borderline High      >= 240        mg/dL        High     . Triglycerides 07/28/2014 163* <150 mg/dL Final  . HDL 07/28/2014 42* >=46 mg/dL Final   ** Please note change in reference range(s). **  .  Total CHOL/HDL Ratio 07/28/2014 3.9   Final  . VLDL 07/28/2014 33  0 - 40 mg/dL Final  . LDL Cholesterol 07/28/2014 87  0 - 99 mg/dL Final   Comment:   Total Cholesterol/HDL Ratio:CHD Risk                        Coronary Heart Disease Risk Table                                        Men       Women          1/2 Average Risk              3.4        3.3              Average Risk              5.0        4.4           2X Average Risk              9.6        7.1           3X Average Risk             23.4       11.0 Use the calculated Patient Ratio above and the CHD Risk table  to determine the patient's CHD Risk. ATP III Classification (LDL):       < 100        mg/dL         Optimal      100 - 129     mg/dL         Near or Above Optimal      130 - 159     mg/dL  Borderline High      160 - 189     mg/dL         High       > 190        mg/dL         Very High     . TSH 07/28/2014 2.382  0.350 - 4.500 uIU/mL Final  . Sodium 07/28/2014 138  135 - 145 mEq/L Final  . Potassium 07/28/2014 4.2  3.5 - 5.3 mEq/L Final  . Chloride 07/28/2014 104  96 - 112 mEq/L Final  . CO2 07/28/2014 27  19 - 32 mEq/L Final  . Glucose, Bld 07/28/2014 158* 70 - 99 mg/dL Final  . BUN 07/28/2014 15  6 - 23 mg/dL Final  . Creat 07/28/2014 0.58  0.50 - 1.10 mg/dL Final  . Total Bilirubin 07/28/2014 0.6  0.2 - 1.2 mg/dL Final  . Alkaline Phosphatase 07/28/2014 55  39 - 117 U/L Final  . AST 07/28/2014 20  0 - 37 U/L Final  . ALT 07/28/2014 18  0 - 35 U/L Final  . Total Protein 07/28/2014 7.4  6.0 - 8.3 g/dL Final  . Albumin 07/28/2014 3.9  3.5 - 5.2 g/dL Final  . Calcium 07/28/2014 10.1  8.4 - 10.5 mg/dL Final  . GFR, Est African American 07/28/2014 >89   Final  . GFR, Est Non African American 07/28/2014 >89   Final   Comment:   The estimated GFR is a calculation valid for adults (>=64 years old) that uses the CKD-EPI algorithm to adjust for age and sex. It is   not to be used for children, pregnant women,  hospitalized patients,    patients on dialysis, or with rapidly changing kidney function. According to the NKDEP, eGFR >89 is normal, 60-89 shows mild impairment, 30-59 shows moderate impairment, 15-29 shows severe impairment and <15 is ESRD.     [name] Kitchen Hgb A1c MFr Bld 07/28/2014 9.5* <5.7 % Final   Comment:                                                                        According to the ADA Clinical Practice Recommendations for 2011, when HbA1c is used as a screening test:     >=6.5%   Diagnostic of Diabetes Mellitus            (if abnormal result is confirmed)   5.7-6.4%   Increased risk of developing Diabetes Mellitus   References:Diagnosis and Classification of Diabetes Mellitus,Diabetes TKPT,4656,81(EXNTZ 1):S62-S69 and Standards of Medical Care in         Diabetes - 2011,Diabetes GYFV,4944,96 (Suppl 1):S11-S61.     . Mean Plasma Glucose 07/28/2014 226* <117 mg/dL Final   The patient is requesting surgical clearance for back surgery. I refuse to provide surgical clearance until we were able to control her blood sugars better. Therefore she is here today for examination. However more concerning today, the patient reports substernal chest tightness that is 8 on a scale of 1-10. It radiates into the center of her back. It has been occurring episodically over the last 2 weeks. It is a tightness and heaviness in the center of her chest. She denies shortness of breath although she did try  taking her husband's albuterol recently due to the severity of the tightness. She denies any relationship of the pain to exertion. EKG today in office shows no significant change compared to her EKG from October 2015. There is no overt ischemia or infarction. At 1050 I gave the patient 0.4 mg sublingual nitroglycerin to see if the patient's chest pain would improve. Also gave the patient 4, 81 mg aspirin and ask her to chew these.  AT that time, my plan was:  Patient's EKG today shows no significant  change compared her EKG from October 2015. However given her numerous risk factors including diabetes mellitus type 2, hypertension, obesity, and noncompliance with her diabetes, I am concerned about possible ACS versus unstable angina. I recommended the patient go to the hospital today for further evaluation to rule out acute coronary syndrome. I certainly do not feel comfortable giving this patient surgical clearance until we obtain a stress test to further evaluate her heart and also obtain better control of her fasting blood sugars.  By 1105, the patient's chest pain had improved on the nitroglycerin. She has been trying numerous acid reflux medications over the last week with no relief. She is complaining of tightness in the chest. She has numerous cardiovascular risk factors. I recommended going to the emergency room today for cardiac enzymes to rule out acute coronary syndrome and unstable angina. After this is been performed, we can obtain a stress test to clear her from a cardiovascular standpoint for her upcoming surgery and then focus our attention on better managing her blood sugars but in the meantime we need to rule out acute coronary syndrome  08/04/14 Patient went to the hospital where ACS was ruled out with normal cardiac enzymes and a nml CXR.  Patient had a clear catheterization of her heart in 2002 and 2007. She was seen by her cardiologist in 2013 while in the hospital and was cleared for surgery without recommending any further cardiac workup. The patient would like to avoid any further stress testing on her heart and would like to proceed with her back surgery. She states that she has had this intermittent chest pain for years. Her fasting blood sugar is currently between 164 and 200 and 12 in the morning and between 178 and 218 in the evening on Lantus 45 units twice daily, metformin 1000 by mouth twice a day, and sporadic use of NovoLog. Past Medical History  Diagnosis Date  . GERD  (gastroesophageal reflux disease)   . Hypothyroidism   . Peripheral neuropathy   . Cardiomyopathy   . Chest pain 06/2005    Hospitalized, dystolic dysfunction  . Sciatica   . Type II diabetes mellitus   . PONV (postoperative nausea and vomiting)   . H/O hiatal hernia   . Migraines     "it's been a long time since I've had one" (03/20/2012)  . Osteoarthritis   . Arthritis     "all over; mostly in my back" (03/20/2012)  . Chronic lower back pain     "have 2 herniated discs; going to have to have a fusion" (03/20/2012)  . Kidney stones     "never had OR" (03/20/2012)  . Breast cancer     "right" (03/20/2012)  . Family history of anesthesia complication     "father has nausea and vomiting"  . Irritable bowel syndrome   . Hypertension     "patient states never had HTN, takes Hyzaar for heart  . Anginal pain 03/19/2012  saw Dr. Cathie Olden .. she thinks its more related to stomach issues  . History of right mastectomy    Past Surgical History  Procedure Laterality Date  . Abdominal hysterectomy  1993  . Total knee arthroplasty  2000    Right  . Mastectomy with axillary lymph node dissection  12/2003    "right" (03/20/2012)  . Thyroidectomy  1977    Right  . Posterior cervical fusion/foraminotomy  2001  . Shoulder arthroscopy w/ rotator cuff repair  2003    Left  . Cardiac catheterization  ? 2005 / 2007  . Adenosin cardiolite  07/2005    (+) wall motion abnormality  . Esophagogastroduodenoscopy  2005    GERD  . [place] of abdomen  07/2005    Fatty liver / kidney stones  . Ct abdomen and pelvis  02/2010    Same  . Knee arthroscopy  1990's    "right" (03/20/2012)  . Axillary lymph node dissection  2003    squamous cell cancer  . Back surgery    . Lumbar laminectomy/decompression microdiscectomy N/A 02/04/2014    Procedure: RIGHT L2-3 MICRODISCECTOMY ;  Surgeon: Jessy Oto, MD;  Location: [facility];  Service: Orthopedics;  Laterality: N/A;   Current Outpatient Prescriptions on  File Prior to Visit  Medication Sig Dispense Refill  . aspirin 81 MG tablet Take 81 mg by mouth daily.    . Cholecalciferol (VITAMIN D) 2000 UNITS CAPS Take 4,000 Units by mouth daily.    [name] Kitchen docusate sodium 100 MG CAPS Take 100 mg by mouth 2 (two) times daily. (Patient taking differently: Take 100 mg by mouth 2 (two) times daily as needed (constipation). ) 10 capsule   . gabapentin (NEURONTIN) 300 MG capsule Take 1 capsule (300 mg total) by mouth 3 (three) times daily. (Patient taking differently: Take 300 mg by mouth daily as needed (nerve pain). ) 90 capsule 3  . ibuprofen (ADVIL,MOTRIN) 200 MG tablet Take 600 mg by mouth every 4 (four) hours as needed for moderate pain.    [name] Kitchen insulin aspart (NOVOLOG) 100 UNIT/ML FlexPen Inject 15 Units into the skin 3 (three) times daily with meals. 5 pen 11  . insulin glargine (LANTUS) 100 unit/mL SOPN Inject 0.45 mLs (45 Units total) into the skin 2 (two) times daily. 15 mL 5  . Insulin Pen Needle (PEN NEEDLES 5/16") 30G X 8 MM MISC Use to inject insulin 5x daily. 200 each 11  . levothyroxine (SYNTHROID, LEVOTHROID) 100 MCG tablet Take 1 tablet (100 mcg total) by mouth daily. 30 tablet 11  . losartan-hydrochlorothiazide (HYZAAR) 50-12.5 MG per tablet Take 1 tablet by mouth daily. 30 tablet 11  . meloxicam (MOBIC) 15 MG tablet Take 15 mg by mouth daily.   4  . metFORMIN (GLUCOPHAGE) 850 MG tablet Take 1 tablet (850 mg total) by mouth 2 (two) times daily with a meal. 60 tablet 3  . methocarbamol (ROBAXIN) 500 MG tablet Take 1 tablet (500 mg total) by mouth every 6 (six) hours as needed for muscle spasms. 40 tablet 1  . OVER THE COUNTER MEDICATION Take 20 mLs by mouth daily as needed (acid reflux). Good Sense Anti Acid Solution    . oxyCODONE-acetaminophen (PERCOCET/ROXICET) 5-325 MG per tablet Take 1 tablet by mouth every 6 (six) hours as needed for severe pain. 15 tablet 0  . polyethylene glycol (MIRALAX / GLYCOLAX) packet Take 17 g by mouth daily as needed  (constipation).     . promethazine (PHENERGAN) 25 MG tablet  Take 1 tablet (25 mg total) by mouth every 8 (eight) hours as needed for nausea or vomiting. 20 tablet 2   Current Facility-Administered Medications on File Prior to Visit  Medication Dose Route Frequency Provider Last Rate Last Dose  . nitroGLYCERIN (NITROSTAT) SL tablet 0.3 mg  0.3 mg Sublingual Q5 min PRN Susy Frizzle, MD       Allergies  Allergen Reactions  . Dilaudid [Hydromorphone Hcl] Nausea And Vomiting  . Ampicillin Rash    REACTION: Rash and welts on her hands when she took it in the hospital  . Latex Itching, Dermatitis and Rash    REACTION: Patient had a reaction to tape after surgery and they told her she was allergic to Latex 03/20/2012 "if it's on too long it will break me out"  . Penicillins Rash   History   Social History  . Marital Status: Married    Spouse Name: N/A  . Number of Children: 1  . Years of Education: N/A   Occupational History  . Retired since 2003    Social History Main Topics  . Smoking status: Never Smoker   . Smokeless tobacco: Never Used  . Alcohol Use: No  . Drug Use: No  . Sexual Activity: Not on file   Other Topics Concern  . Not on file   Social History Narrative   Family History  Problem Relation Age of Onset  . Hypertension Mother   . Diabetes Father   . Hypertension Father   . Hypertension Sister   . Diabetes Other     Family Hx. of Diabetes  . Hypertension Other     Family History of HTN  . Hypertension Brother   . Hypertension Brother   . Diabetes Brother      Review of Systems  All other systems reviewed and are negative.      Objective:   Physical Exam  Constitutional: She appears well-developed and well-nourished. No distress.  Neck: Neck supple. No thyromegaly present.  Cardiovascular: Normal rate, regular rhythm and normal heart sounds.   No murmur heard. Pulmonary/Chest: Effort normal and breath sounds normal. No respiratory distress.  She has no wheezes. She has no rales.  Abdominal: Soft. Bowel sounds are normal. She exhibits no distension. There is no tenderness. There is no rebound and no guarding.  Musculoskeletal: She exhibits no edema.  Lymphadenopathy:    She has no cervical adenopathy.  Skin: No rash noted. She is not diaphoretic.  Vitals reviewed.        Assessment & Plan:  Diabetes mellitus type II, uncontrolled patient declines any further stress testing. At the present time I feel that it is very unlikely that this is cardiac in nature given the chronicity of it and a very thorough cardiac workup in the past. Therefore I will bring her medical clearance now that her sugars are less than 200. I would like her to continue Lantus 45 units twice daily, metformin 1000 mg by mouth twice a day and I will replace NovoLog with glyxambi 5/25 by mouth daily to improve compliance and address postprandial blood sugars. Recheck hemoglobin A1c in 3 months. I gave the patient over 6 weeks of samples.

## 2014-08-18 ENCOUNTER — Other Ambulatory Visit (HOSPITAL_COMMUNITY): Payer: Self-pay | Admitting: Specialist

## 2014-08-18 ENCOUNTER — Encounter (HOSPITAL_COMMUNITY): Payer: Self-pay

## 2014-08-18 ENCOUNTER — Encounter (HOSPITAL_COMMUNITY)
Admission: RE | Admit: 2014-08-18 | Discharge: 2014-08-18 | Disposition: A | Discharge status: Home / Self Care | Payer: Commercial Managed Care - HMO | Source: Ambulatory Visit | Attending: Specialist | Admitting: Specialist

## 2014-08-18 DIAGNOSIS — E119 Type 2 diabetes mellitus without complications: Secondary | ICD-10-CM | POA: Diagnosis not present

## 2014-08-18 DIAGNOSIS — Z01812 Encounter for preprocedural laboratory examination: Secondary | ICD-10-CM | POA: Insufficient documentation

## 2014-08-18 DIAGNOSIS — E039 Hypothyroidism, unspecified: Secondary | ICD-10-CM | POA: Diagnosis not present

## 2014-08-18 HISTORY — DX: Personal history of urinary calculi: Z87.442

## 2014-08-18 LAB — BASIC METABOLIC PANEL
ANION GAP: 8 (ref 5–15)
BUN: 14 mg/dL (ref 6–20)
CO2: 21 mmol/L — AB (ref 22–32)
CREATININE: 0.79 mg/dL (ref 0.44–1.00)
Calcium: 10.4 mg/dL — ABNORMAL HIGH (ref 8.9–10.3)
Chloride: 111 mmol/L (ref 101–111)
Glucose, Bld: 170 mg/dL — ABNORMAL HIGH (ref 70–99)
POTASSIUM: 4 mmol/L (ref 3.5–5.1)
SODIUM: 140 mmol/L (ref 135–145)

## 2014-08-18 LAB — CBC
HCT: 42.9 % (ref 36.0–46.0)
Hemoglobin: 13.6 g/dL (ref 12.0–15.0)
MCH: 29.2 pg (ref 26.0–34.0)
MCHC: 31.7 g/dL (ref 30.0–36.0)
MCV: 92.1 fL (ref 78.0–100.0)
Platelets: 268 10*3/uL (ref 150–400)
RBC: 4.66 MIL/uL (ref 3.87–5.11)
RDW: 14.6 % (ref 11.5–15.5)
WBC: 6.9 10*3/uL (ref 4.0–10.5)

## 2014-08-18 LAB — TYPE AND SCREEN
ABO/RH(D): A POS
ANTIBODY SCREEN: NEGATIVE

## 2014-08-18 LAB — SURGICAL PCR SCREEN
MRSA, PCR: NEGATIVE
Staphylococcus aureus: POSITIVE — AB

## 2014-08-18 NOTE — Pre-Procedure Instructions (Addendum)
[name]  08/18/2014   Your procedure is scheduled on:  08/22/14  Report to [facility] short stay admitting at 530 AM.  Call this number if you have problems the morning of surgery: [phone]   Remember:   Do not eat food or drink liquids after midnight.   Take these medicines the morning of surgery with A SIP OF WATER: gabapentin, zantac, levothyroxine,                         robaxin, pain med, nitro if needed    STOP all herbel meds, nsaids (aleve,naproxen,advil,ibuprofen) starting NOW including aspirin, vitamins,meloxicam,  NO diabetic med, or insulin am of surgery   Do not wear jewelry, make-up or nail polish.  Do not wear lotions, powders, or perfumes. You may wear deodorant.  Do not shave 48 hours prior to surgery. Men may shave face and neck.  Do not bring valuables to the hospital.  [facility] is not responsible                  for any belongings or valuables.               Contacts, dentures or bridgework may not be worn into surgery.  Leave suitcase in the car. After surgery it may be brought to your room.  For patients admitted to the hospital, discharge time is determined by your                treatment team.               Patients discharged the day of surgery will not be allowed to drive  home.  Name and phone number of your driver:  Special Instructions:  Special Instructions: [facility] - Preparing for Surgery  Before surgery, you can play an important role.  Because skin is not sterile, your skin needs to be as free of germs as possible.  You can reduce the number of germs on you skin by washing with CHG (chlorahexidine gluconate) soap before surgery.  CHG is an antiseptic cleaner which kills germs and bonds with the skin to continue killing germs even after washing.  Please DO NOT use if you have an allergy to CHG or antibacterial soaps.  If your skin becomes reddened/irritated stop using the CHG and inform your nurse when you arrive at Short  Stay.  Do not shave (including legs and underarms) for at least 48 hours prior to the first CHG shower.  You may shave your face.  Please follow these instructions carefully:   1.  Shower with CHG Soap the night before surgery and the morning of Surgery.  2.  If you choose to wash your hair, wash your hair first as usual with your normal shampoo.  3.  After you shampoo, rinse your hair and body thoroughly to remove the Shampoo.  4.  Use CHG as you would any other liquid soap.  You can apply chg directly  to the skin and wash gently with scrungie or a clean washcloth.  5.  Apply the CHG Soap to your body ONLY FROM THE NECK DOWN.  Do not use on open wounds or open sores.  Avoid contact with your eyes ears, mouth and genitals (private parts).  Wash genitals (private parts)       with your normal soap.  6.  Wash thoroughly, paying special attention to the area where your surgery will be performed.  7.  Thoroughly rinse your body with warm water from the neck down.  8.  DO NOT shower/wash with your normal soap after using and rinsing off the CHG Soap.  9.  Pat yourself dry with a clean towel.            10.  Wear clean pajamas.            11.  Place clean sheets on your bed the night of your first shower and do not sleep with pets.  Day of Surgery  Do not apply any lotions/deodorants the morning of surgery.  Please wear clean clothes to the hospital/surgery center.   Please read over the following fact sheets that you were given: Pain Booklet, Coughing and Deep Breathing, Blood Transfusion Information, MRSA Information and Surgical Site Infection Prevention

## 2014-08-18 NOTE — Progress Notes (Signed)
   08/18/14 1524  OBSTRUCTIVE SLEEP APNEA  Have you ever been diagnosed with sleep apnea through a sleep study? No  Do you snore loudly (loud enough to be heard through closed doors)?  0  Do you often feel tired, fatigued, or sleepy during the daytime? 0  Has anyone observed you stop breathing during your sleep? 0  Do you have, or are you being treated for high blood pressure? 1  BMI more than 35 kg/m2? 1  Age over 66 years old? 1  Neck circumference greater than 40 cm/16 inches? 1 (16.5)  Gender: 0  Obstructive Sleep Apnea Score 4

## 2014-08-19 NOTE — Progress Notes (Signed)
Anesthesia Chart Review:  Pt is 64 year old female scheduled for right L2-3 and right L1-2 transforaminal interbody fusion on 08/22/14 by Dr. Louanne Skye.  PMH: Non-smoker, personal and family history of post-operative N/V, HTN, cardiomyopathy (NICM from chemo), DM2, peripheral neuropathy, breast cancer s/p right mastectomy, thyroidectomy with secondary hypothyroidism, GERD, hiatal hernia, IBS, posterior cervical fusion, right TKA. She is s/p L3-5 PLIF '14 and right L2-3 microdiscectomy 02/04/14. BMI is consistent with obesity.   PCP is Dr. Dennard Schaumann, last visit 08/04/14. He initially saw her on 07/29/14 for follow-up and pre-operative evaluation.  He would not clear her at that time due to non-compliance with her DM medications and due to complaints of chest pain.  She was sent to the ED and ruled out for ACS. He saw her for follow-up on 08/04/14.  By then her fasting glucose levels were 200 or less.  He had reviewed her ED notes and prior cardiac testing. She declined any further stress testing as she had reported chronic intermittent chest pain for years.  Previous caths in 2002 and 2007 showing normal coronaries. She was diagnosed with non-ischemic CM felt related to chemotherapy, but follow-up echo in 2013 showed a normal LVEF.  Subsequently, Dr. Dennard Schaumann medically cleared patient for this procedure.     Medications include: ASA, losartan/hctz, nitroglycerin, Lantus, metformin, levothyroxine, gabapentin, Zantac.  Preoperative labs reviewed. Glucose 170. A1C on 07/28/14 was 9.5.   07/29/14 CXR: No active cardiopulmonary disease.  07/29/14 EKG: SR, non-specific intra-ventricular conduction delay, probable LVH, T wave abnormality--appears non-specific to me.  There is baseline wanderer in limb leads which could effect interpretation.    Consult note in EPIC by Jenell Milliner dated 03/20/2012 states: Cath 2002 (after anteroapical ischemia noted on stress test) showed normal coronary arteries and LV function.  Right/Left Cath 2007 (after stress showed apical defect and decreased LV function) showed normal coronary arteries, global hypokinesis, EF 40%. It was suspected she had NICM r/t chemotherapy. She followed up with Dr.Nishan where it was noted she continued to have atypical chest pain that was noncardiac in etiology. Repeat Echo in 2007 showed EF 55%, mild inferior wall hypokinesis, no significant valvular abnormalities.  2D echo 03/20/2012:  Left ventricle: The cavity size was normal. Wall thickness was normal. Systolic function was normal. The estimated ejection fraction was in the range of 55% to 60%. Wall motion was normal; there were no regional wall motion abnormalities. Doppler parameters are consistent with abnormal left ventricular relaxation (grade 1 diastolic dysfunction).  Dr. Dennard Schaumann has medically cleared patient following her recent visit for chest pain (see his detailed note for specifics) and because she had better DM control.  I have updated anesthesiologist Dr. Ermalene Postin.  If no acute changes, fasting CBG reasonable, and no new/progressive CV symptoms then it is anticipated that she can proceed as planned.  Her assigned anesthesiologist will evaluate her on the day of surgery.  George Hugh [facility] Short Stay Center/Anesthesiology Phone [phone] 08/19/2014 2:34 PM

## 2014-08-21 MED ORDER — VANCOMYCIN HCL IN DEXTROSE 1-5 GM/200ML-% IV SOLN
1000.0000 mg | INTRAVENOUS | Status: AC
  Administered 2014-08-22: 1000 mg via INTRAVENOUS
  Filled 2014-08-21: qty 200

## 2014-08-21 MED ORDER — CHLORHEXIDINE GLUCONATE 4 % EX LIQD
60.0000 mL | Freq: Once | CUTANEOUS | Status: DC
  Filled 2014-08-21: qty 60

## 2014-08-22 ENCOUNTER — Encounter (HOSPITAL_COMMUNITY)
Admission: RE | Disposition: A | Discharge status: Home Health | Payer: Commercial Managed Care - HMO | Source: Ambulatory Visit | Attending: Specialist

## 2014-08-22 ENCOUNTER — Encounter: Payer: Self-pay | Admitting: Family Medicine

## 2014-08-22 ENCOUNTER — Inpatient Hospital Stay (HOSPITAL_COMMUNITY): Payer: Commercial Managed Care - HMO

## 2014-08-22 ENCOUNTER — Inpatient Hospital Stay (HOSPITAL_COMMUNITY): Payer: Commercial Managed Care - HMO | Admitting: Anesthesiology

## 2014-08-22 ENCOUNTER — Encounter (HOSPITAL_COMMUNITY): Payer: Self-pay | Admitting: *Deleted

## 2014-08-22 ENCOUNTER — Inpatient Hospital Stay (HOSPITAL_COMMUNITY)
Admission: RE | Admit: 2014-08-22 | Discharge: 2014-08-25 | DRG: 460 | Disposition: A | Discharge status: Home Health | Payer: Commercial Managed Care - HMO | Source: Ambulatory Visit | Attending: Specialist | Admitting: Specialist

## 2014-08-22 ENCOUNTER — Inpatient Hospital Stay (HOSPITAL_COMMUNITY): Payer: Commercial Managed Care - HMO | Admitting: Vascular Surgery

## 2014-08-22 DIAGNOSIS — K219 Gastro-esophageal reflux disease without esophagitis: Secondary | ICD-10-CM | POA: Diagnosis present

## 2014-08-22 DIAGNOSIS — M961 Postlaminectomy syndrome, not elsewhere classified: Secondary | ICD-10-CM | POA: Diagnosis not present

## 2014-08-22 DIAGNOSIS — M4316 Spondylolisthesis, lumbar region: Secondary | ICD-10-CM | POA: Diagnosis not present

## 2014-08-22 DIAGNOSIS — Z452 Encounter for adjustment and management of vascular access device: Secondary | ICD-10-CM | POA: Diagnosis not present

## 2014-08-22 DIAGNOSIS — Z7982 Long term (current) use of aspirin: Secondary | ICD-10-CM | POA: Diagnosis not present

## 2014-08-22 DIAGNOSIS — M5136 Other intervertebral disc degeneration, lumbar region: Secondary | ICD-10-CM | POA: Diagnosis not present

## 2014-08-22 DIAGNOSIS — M5116 Intervertebral disc disorders with radiculopathy, lumbar region: Secondary | ICD-10-CM | POA: Diagnosis not present

## 2014-08-22 DIAGNOSIS — Z87442 Personal history of urinary calculi: Secondary | ICD-10-CM | POA: Diagnosis not present

## 2014-08-22 DIAGNOSIS — E119 Type 2 diabetes mellitus without complications: Secondary | ICD-10-CM | POA: Diagnosis present

## 2014-08-22 DIAGNOSIS — Z8249 Family history of ischemic heart disease and other diseases of the circulatory system: Secondary | ICD-10-CM | POA: Diagnosis not present

## 2014-08-22 DIAGNOSIS — E039 Hypothyroidism, unspecified: Secondary | ICD-10-CM | POA: Diagnosis present

## 2014-08-22 DIAGNOSIS — M7130 Other bursal cyst, unspecified site: Secondary | ICD-10-CM | POA: Diagnosis present

## 2014-08-22 DIAGNOSIS — Z853 Personal history of malignant neoplasm of breast: Secondary | ICD-10-CM | POA: Diagnosis not present

## 2014-08-22 DIAGNOSIS — M25551 Pain in right hip: Secondary | ICD-10-CM | POA: Diagnosis present

## 2014-08-22 DIAGNOSIS — M5126 Other intervertebral disc displacement, lumbar region: Principal | ICD-10-CM | POA: Diagnosis present

## 2014-08-22 DIAGNOSIS — Z96651 Presence of right artificial knee joint: Secondary | ICD-10-CM | POA: Diagnosis present

## 2014-08-22 DIAGNOSIS — I429 Cardiomyopathy, unspecified: Secondary | ICD-10-CM | POA: Diagnosis not present

## 2014-08-22 DIAGNOSIS — Z794 Long term (current) use of insulin: Secondary | ICD-10-CM

## 2014-08-22 DIAGNOSIS — G8929 Other chronic pain: Secondary | ICD-10-CM | POA: Diagnosis present

## 2014-08-22 DIAGNOSIS — M1990 Unspecified osteoarthritis, unspecified site: Secondary | ICD-10-CM | POA: Diagnosis present

## 2014-08-22 DIAGNOSIS — R0789 Other chest pain: Secondary | ICD-10-CM

## 2014-08-22 DIAGNOSIS — G629 Polyneuropathy, unspecified: Secondary | ICD-10-CM | POA: Diagnosis present

## 2014-08-22 DIAGNOSIS — M47816 Spondylosis without myelopathy or radiculopathy, lumbar region: Secondary | ICD-10-CM

## 2014-08-22 DIAGNOSIS — Z833 Family history of diabetes mellitus: Secondary | ICD-10-CM | POA: Diagnosis not present

## 2014-08-22 DIAGNOSIS — Z9011 Acquired absence of right breast and nipple: Secondary | ICD-10-CM | POA: Diagnosis present

## 2014-08-22 DIAGNOSIS — Z9889 Other specified postprocedural states: Secondary | ICD-10-CM | POA: Diagnosis not present

## 2014-08-22 DIAGNOSIS — Z419 Encounter for procedure for purposes other than remedying health state, unspecified: Secondary | ICD-10-CM

## 2014-08-22 DIAGNOSIS — M47817 Spondylosis without myelopathy or radiculopathy, lumbosacral region: Secondary | ICD-10-CM | POA: Diagnosis not present

## 2014-08-22 HISTORY — PX: LUMBAR FUSION: SHX111

## 2014-08-22 LAB — HEPATIC FUNCTION PANEL
ALBUMIN: 3.8 g/dL (ref 3.5–5.0)
ALT: 19 U/L (ref 14–54)
AST: 25 U/L (ref 15–41)
Alkaline Phosphatase: 52 U/L (ref 38–126)
BILIRUBIN INDIRECT: 0.4 mg/dL (ref 0.3–0.9)
Bilirubin, Direct: 0.2 mg/dL (ref 0.1–0.5)
TOTAL PROTEIN: 7.4 g/dL (ref 6.5–8.1)
Total Bilirubin: 0.6 mg/dL (ref 0.3–1.2)

## 2014-08-22 LAB — GLUCOSE, CAPILLARY
Glucose-Capillary: 136 mg/dL — ABNORMAL HIGH (ref 65–99)
Glucose-Capillary: 211 mg/dL — ABNORMAL HIGH (ref 65–99)

## 2014-08-22 IMAGING — RF DG C-ARM GT 120 MIN
1 series · 4 of 4 positions shown · non-contrast
Comparison: MR 02/17/2012

CLINICAL DATA: Lumbar fusion

LUMBAR SPINE - 2-3 VIEW,DG C-ARM GT 120 MIN

[Series 1: run · 4 of 4 slices shown]
[im 1/4]
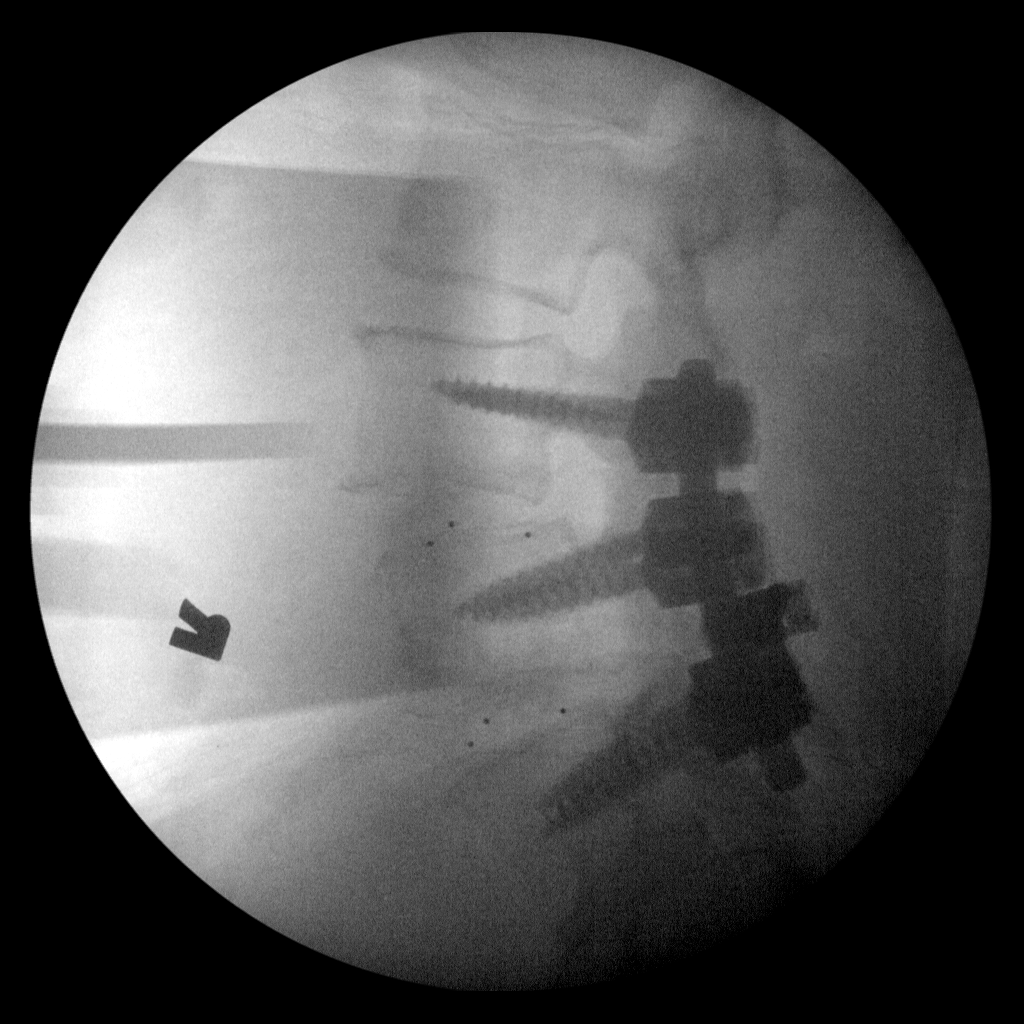
[im 2/4]
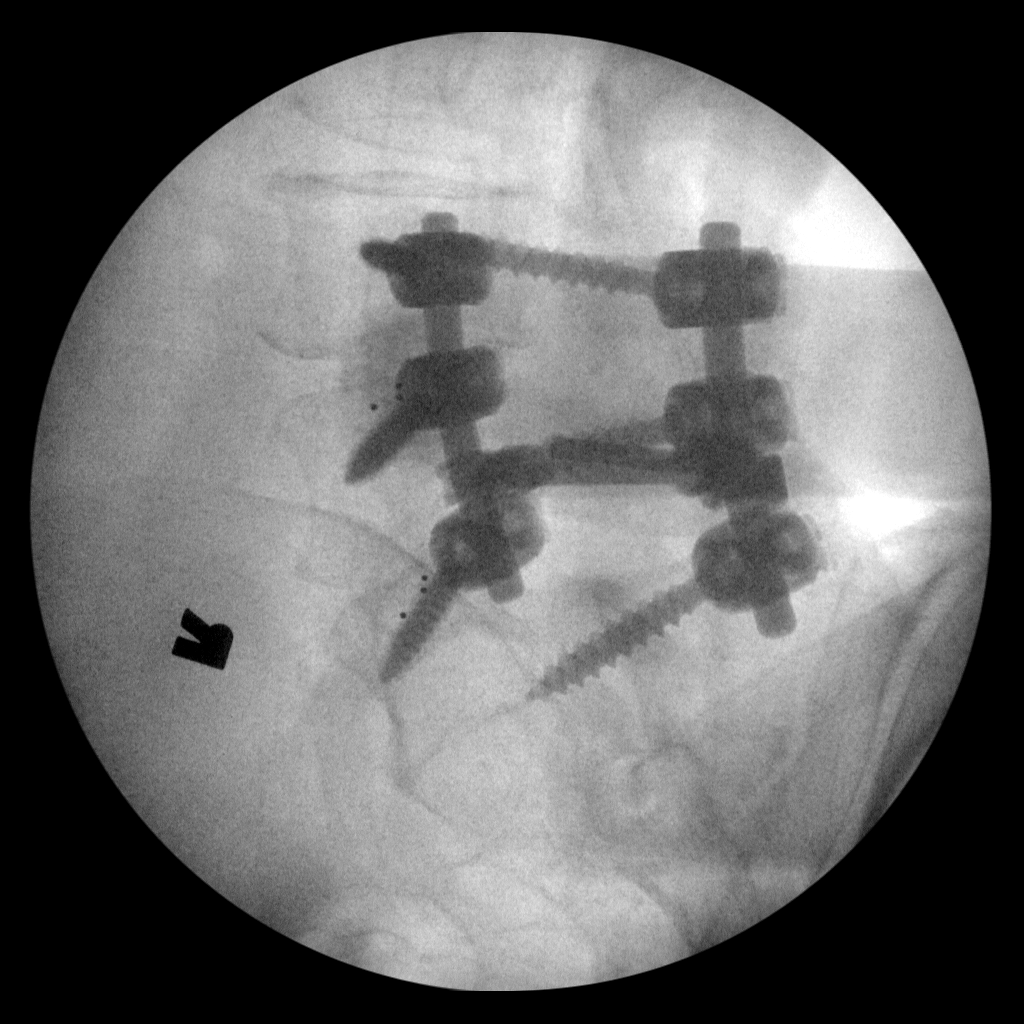
[im 3/4]
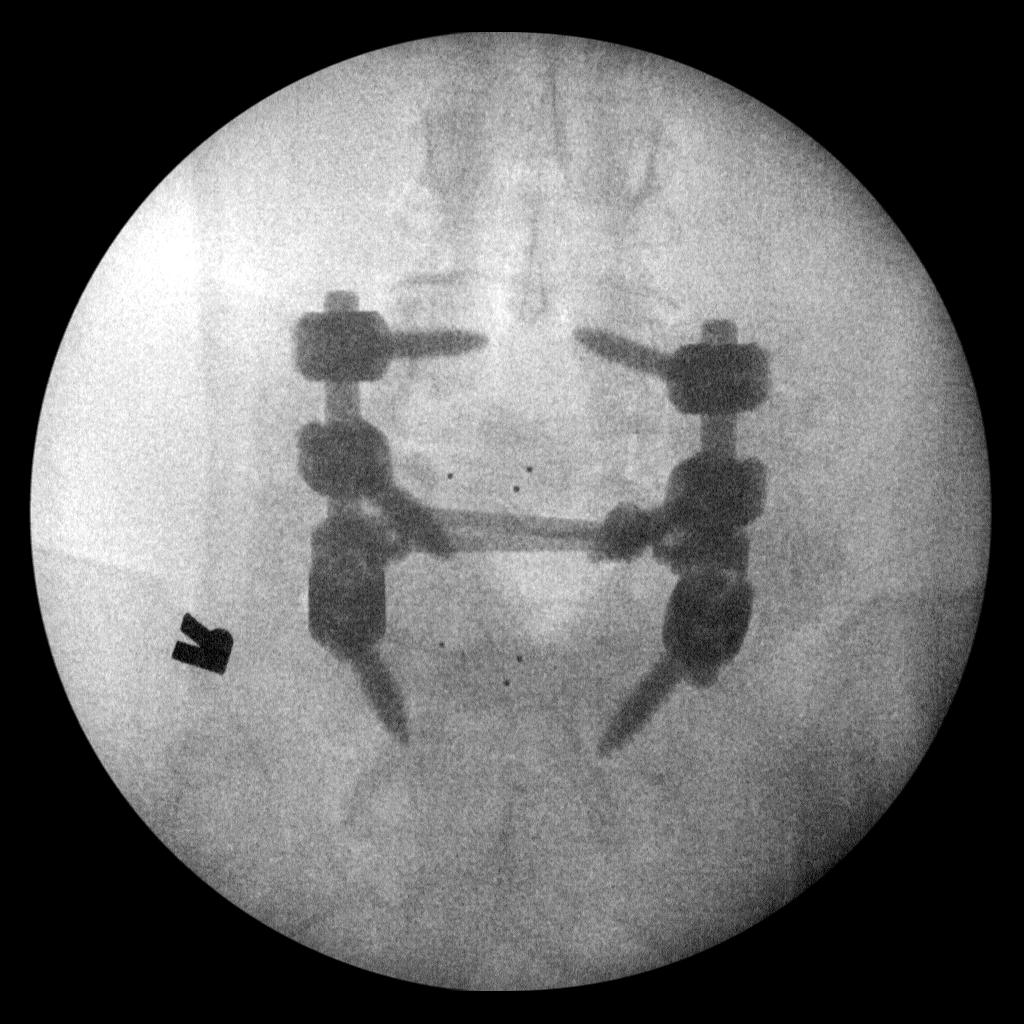
[im 4/4]
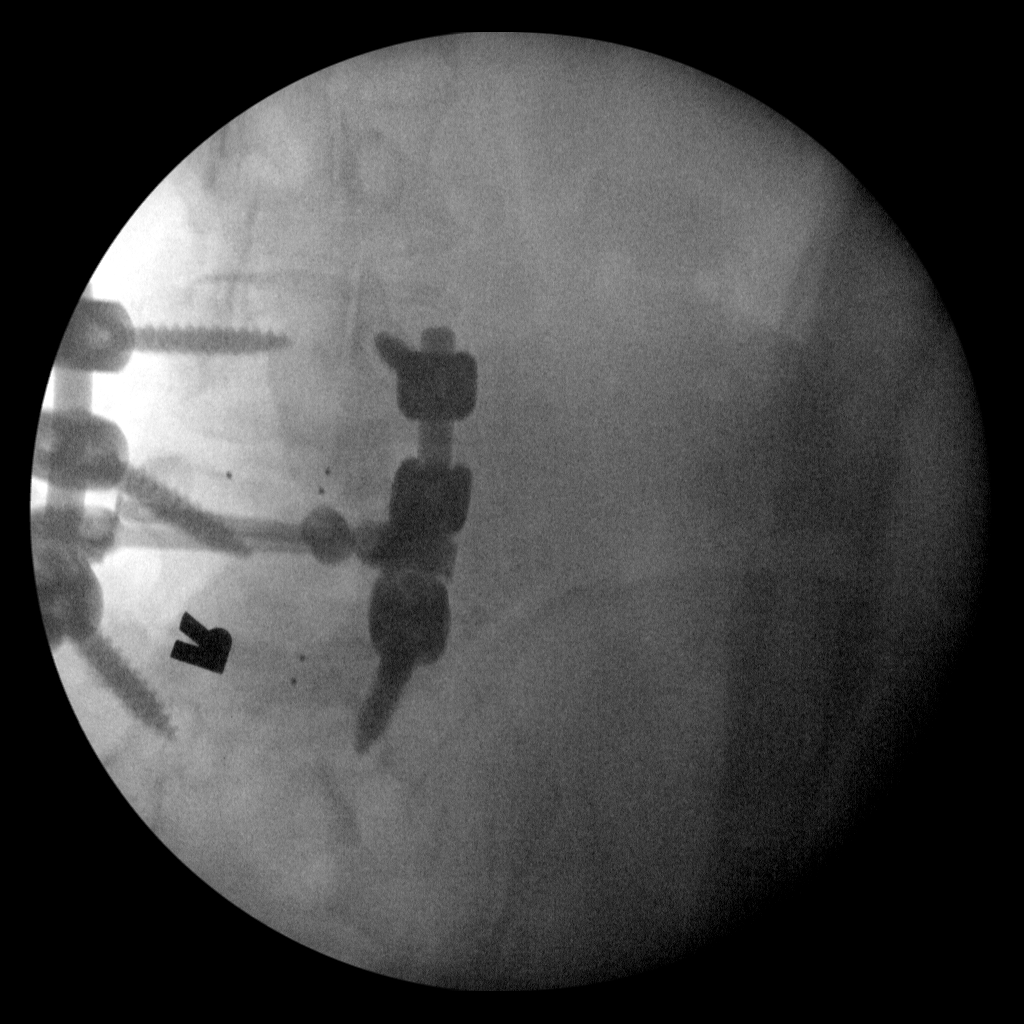

[4 of 4 positions shown; findings below may reference images not displayed]

FINDINGS: The four spot intraoperative fluoroscopic spot images
document changes of PLIF L4-S1, hardware projecting in expected
location.  Graft markers project in the interspaces.
IMPRESSION: 1.  PLIF L4 S1.

## 2014-08-22 SURGERY — FUSION, SPINE, LUMBAR, MINIMALLY INVASIVE
Anesthesia: General

## 2014-08-22 MED ORDER — INSULIN ASPART 100 UNIT/ML ~~LOC~~ SOLN
0.0000 [IU] | Freq: Three times a day (TID) | SUBCUTANEOUS | Status: DC
  Administered 2014-08-25: 3 [IU] via SUBCUTANEOUS

## 2014-08-22 MED ORDER — MIDAZOLAM HCL 2 MG/2ML IJ SOLN
INTRAMUSCULAR | Status: AC
  Filled 2014-08-22: qty 2

## 2014-08-22 MED ORDER — PHENOL 1.4 % MT LIQD: 1.0000 | OROMUCOSAL | Status: DC | PRN

## 2014-08-22 MED ORDER — OXYCODONE-ACETAMINOPHEN 5-325 MG PO TABS
1.0000 | ORAL_TABLET | ORAL | Status: DC | PRN
  Administered 2014-08-23: 1 via ORAL
  Administered 2014-08-23: 2 via ORAL
  Administered 2014-08-23 (×2): 1 via ORAL
  Administered 2014-08-24 – 2014-08-25 (×3): 2 via ORAL
  Filled 2014-08-22: qty 1
  Filled 2014-08-22: qty 2
  Filled 2014-08-22: qty 1
  Filled 2014-08-22: qty 2
  Filled 2014-08-22: qty 1
  Filled 2014-08-22 (×2): qty 2

## 2014-08-22 MED ORDER — HEMOSTATIC AGENTS (NO CHARGE) OPTIME
TOPICAL | Status: DC | PRN
  Administered 2014-08-22: 1 via TOPICAL

## 2014-08-22 MED ORDER — HYDROCODONE-ACETAMINOPHEN 5-325 MG PO TABS: 1.0000 | ORAL_TABLET | ORAL | Status: DC | PRN

## 2014-08-22 MED ORDER — VECURONIUM BROMIDE 10 MG IV SOLR
INTRAVENOUS | Status: AC
  Filled 2014-08-22: qty 10

## 2014-08-22 MED ORDER — MORPHINE SULFATE 2 MG/ML IJ SOLN
INTRAMUSCULAR | Status: AC
  Administered 2014-08-22: 2 mg via INTRAVENOUS
  Filled 2014-08-22: qty 1

## 2014-08-22 MED ORDER — PROPOFOL 10 MG/ML IV BOLUS
INTRAVENOUS | Status: AC
  Filled 2014-08-22: qty 20

## 2014-08-22 MED ORDER — ACETAMINOPHEN 325 MG PO TABS: 650.0000 mg | ORAL_TABLET | Freq: Four times a day (QID) | ORAL | Status: DC | PRN

## 2014-08-22 MED ORDER — HYDROCHLOROTHIAZIDE 12.5 MG PO CAPS
12.5000 mg | ORAL_CAPSULE | Freq: Every day | ORAL | Status: DC
  Administered 2014-08-22 – 2014-08-25 (×4): 12.5 mg via ORAL
  Filled 2014-08-22 (×4): qty 1

## 2014-08-22 MED ORDER — DOCUSATE SODIUM 100 MG PO CAPS
100.0000 mg | ORAL_CAPSULE | Freq: Two times a day (BID) | ORAL | Status: DC
  Administered 2014-08-22 – 2014-08-25 (×6): 100 mg via ORAL
  Filled 2014-08-22 (×6): qty 1

## 2014-08-22 MED ORDER — GABAPENTIN 300 MG PO CAPS
300.0000 mg | ORAL_CAPSULE | Freq: Three times a day (TID) | ORAL | Status: DC
  Administered 2014-08-22 – 2014-08-25 (×10): 300 mg via ORAL
  Filled 2014-08-22 (×10): qty 1

## 2014-08-22 MED ORDER — NEOSTIGMINE METHYLSULFATE 10 MG/10ML IV SOLN
INTRAVENOUS | Status: DC | PRN
  Administered 2014-08-22: 2.5 mg via INTRAVENOUS

## 2014-08-22 MED ORDER — BUPIVACAINE HCL 0.5 % IJ SOLN
INTRAMUSCULAR | Status: DC | PRN
  Administered 2014-08-22: 10 mL

## 2014-08-22 MED ORDER — MENTHOL 3 MG MT LOZG: 1.0000 | LOZENGE | OROMUCOSAL | Status: DC | PRN

## 2014-08-22 MED ORDER — SODIUM CHLORIDE 0.9 % IV SOLN
INTRAVENOUS | Status: DC | PRN
  Administered 2014-08-22: 12:00:00 via INTRAVENOUS

## 2014-08-22 MED ORDER — ARTIFICIAL TEARS OP OINT
TOPICAL_OINTMENT | OPHTHALMIC | Status: AC
  Filled 2014-08-22: qty 3.5

## 2014-08-22 MED ORDER — FAMOTIDINE 20 MG PO TABS
20.0000 mg | ORAL_TABLET | Freq: Two times a day (BID) | ORAL | Status: DC
  Administered 2014-08-22 – 2014-08-25 (×6): 20 mg via ORAL
  Filled 2014-08-22 (×6): qty 1

## 2014-08-22 MED ORDER — LIDOCAINE HCL (CARDIAC) 20 MG/ML IV SOLN
INTRAVENOUS | Status: AC
  Filled 2014-08-22: qty 5

## 2014-08-22 MED ORDER — ONDANSETRON HCL 4 MG/2ML IJ SOLN
INTRAMUSCULAR | Status: DC | PRN
  Administered 2014-08-22 (×2): 4 mg via INTRAVENOUS

## 2014-08-22 MED ORDER — ONDANSETRON HCL 4 MG/2ML IJ SOLN
4.0000 mg | INTRAMUSCULAR | Status: DC | PRN
  Administered 2014-08-23: 4 mg via INTRAVENOUS
  Filled 2014-08-22: qty 2

## 2014-08-22 MED ORDER — PHENYLEPHRINE 40 MCG/ML (10ML) SYRINGE FOR IV PUSH (FOR BLOOD PRESSURE SUPPORT)
PREFILLED_SYRINGE | INTRAVENOUS | Status: AC
  Filled 2014-08-22: qty 30

## 2014-08-22 MED ORDER — SODIUM CHLORIDE 0.9 % IJ SOLN: 3.0000 mL | Freq: Two times a day (BID) | INTRAMUSCULAR | Status: DC

## 2014-08-22 MED ORDER — FLEET ENEMA 7-19 GM/118ML RE ENEM: 1.0000 | ENEMA | Freq: Once | RECTAL | Status: AC | PRN

## 2014-08-22 MED ORDER — DEXTROSE 5 % IV SOLN
INTRAVENOUS | Status: DC | PRN
Start: 2014-08-22 — End: 2014-08-22
  Administered 2014-08-22: 07:00:00 via INTRAVENOUS

## 2014-08-22 MED ORDER — SODIUM CHLORIDE 0.9 % IV SOLN: 250.0000 mL | INTRAVENOUS | Status: DC

## 2014-08-22 MED ORDER — IBUPROFEN 200 MG PO TABS: 600.0000 mg | ORAL_TABLET | ORAL | Status: DC | PRN

## 2014-08-22 MED ORDER — ROCURONIUM BROMIDE 100 MG/10ML IV SOLN
INTRAVENOUS | Status: DC | PRN
  Administered 2014-08-22: 50 mg via INTRAVENOUS

## 2014-08-22 MED ORDER — PROMETHAZINE HCL 25 MG PO TABS: 25.0000 mg | ORAL_TABLET | Freq: Three times a day (TID) | ORAL | Status: DC | PRN

## 2014-08-22 MED ORDER — GLYCOPYRROLATE 0.2 MG/ML IJ SOLN
INTRAMUSCULAR | Status: DC | PRN
  Administered 2014-08-22: 0.3 mg via INTRAVENOUS

## 2014-08-22 MED ORDER — LEVOTHYROXINE SODIUM 100 MCG PO TABS
100.0000 ug | ORAL_TABLET | Freq: Every day | ORAL | Status: DC
Start: 2014-08-23 — End: 2014-08-25
  Administered 2014-08-23 – 2014-08-25 (×3): 100 ug via ORAL
  Filled 2014-08-22 (×3): qty 1

## 2014-08-22 MED ORDER — ALBUMIN HUMAN 5 % IV SOLN
INTRAVENOUS | Status: DC | PRN
  Administered 2014-08-22 (×3): via INTRAVENOUS

## 2014-08-22 MED ORDER — ALUM & MAG HYDROXIDE-SIMETH 200-200-20 MG/5ML PO SUSP
30.0000 mL | Freq: Four times a day (QID) | ORAL | Status: DC | PRN
Start: 2014-08-22 — End: 2014-08-25

## 2014-08-22 MED ORDER — THROMBIN 20000 UNITS EX KIT
PACK | CUTANEOUS | Status: DC | PRN
  Administered 2014-08-22: 09:00:00 via TOPICAL

## 2014-08-22 MED ORDER — LACTATED RINGERS IV SOLN
INTRAVENOUS | Status: DC | PRN
  Administered 2014-08-22 (×3): via INTRAVENOUS

## 2014-08-22 MED ORDER — SODIUM CHLORIDE 0.9 % IV SOLN
10.0000 mg | INTRAVENOUS | Status: DC | PRN
  Administered 2014-08-22: 10 ug/min via INTRAVENOUS

## 2014-08-22 MED ORDER — SCOPOLAMINE 1 MG/3DAYS TD PT72
MEDICATED_PATCH | TRANSDERMAL | Status: AC
  Filled 2014-08-22: qty 1

## 2014-08-22 MED ORDER — ACETAMINOPHEN 650 MG RE SUPP: 650.0000 mg | RECTAL | Status: DC | PRN

## 2014-08-22 MED ORDER — ZOLPIDEM TARTRATE 5 MG PO TABS: 5.0000 mg | ORAL_TABLET | Freq: Every evening | ORAL | Status: DC | PRN

## 2014-08-22 MED ORDER — PROMETHAZINE HCL 25 MG/ML IJ SOLN
INTRAMUSCULAR | Status: AC
  Filled 2014-08-22: qty 1

## 2014-08-22 MED ORDER — 0.9 % SODIUM CHLORIDE (POUR BTL) OPTIME
TOPICAL | Status: DC | PRN
  Administered 2014-08-22: 2000 mL

## 2014-08-22 MED ORDER — MIDAZOLAM HCL 5 MG/5ML IJ SOLN
INTRAMUSCULAR | Status: DC | PRN
  Administered 2014-08-22: 2 mg via INTRAVENOUS

## 2014-08-22 MED ORDER — ASPIRIN EC 81 MG PO TBEC
81.0000 mg | DELAYED_RELEASE_TABLET | Freq: Every day | ORAL | Status: DC
  Administered 2014-08-22 – 2014-08-25 (×4): 81 mg via ORAL
  Filled 2014-08-22 (×4): qty 1

## 2014-08-22 MED ORDER — FENTANYL CITRATE (PF) 100 MCG/2ML IJ SOLN
INTRAMUSCULAR | Status: DC | PRN
  Administered 2014-08-22: 50 ug via INTRAVENOUS
  Administered 2014-08-22 (×2): 100 ug via INTRAVENOUS
  Administered 2014-08-22: 50 ug via INTRAVENOUS
  Administered 2014-08-22: 100 ug via INTRAVENOUS
  Administered 2014-08-22 (×2): 50 ug via INTRAVENOUS

## 2014-08-22 MED ORDER — METHOCARBAMOL 500 MG PO TABS: 500.0000 mg | ORAL_TABLET | Freq: Four times a day (QID) | ORAL | Status: DC | PRN

## 2014-08-22 MED ORDER — POLYETHYLENE GLYCOL 3350 17 G PO PACK: 17.0000 g | PACK | Freq: Every day | ORAL | Status: DC | PRN

## 2014-08-22 MED ORDER — ARTIFICIAL TEARS OP OINT
TOPICAL_OINTMENT | OPHTHALMIC | Status: DC | PRN
  Administered 2014-08-22: 1 via OPHTHALMIC

## 2014-08-22 MED ORDER — SCOPOLAMINE 1 MG/3DAYS TD PT72
MEDICATED_PATCH | TRANSDERMAL | Status: DC | PRN
  Administered 2014-08-22: 1.5 mg via TRANSDERMAL

## 2014-08-22 MED ORDER — THROMBIN 20000 UNITS EX SOLR
CUTANEOUS | Status: AC
  Filled 2014-08-22: qty 20000

## 2014-08-22 MED ORDER — MORPHINE SULFATE 2 MG/ML IJ SOLN
INTRAMUSCULAR | Status: AC
  Filled 2014-08-22: qty 1

## 2014-08-22 MED ORDER — ONDANSETRON HCL 4 MG/2ML IJ SOLN
INTRAMUSCULAR | Status: AC
  Filled 2014-08-22: qty 4

## 2014-08-22 MED ORDER — DEXAMETHASONE SODIUM PHOSPHATE 4 MG/ML IJ SOLN
INTRAMUSCULAR | Status: AC
  Filled 2014-08-22: qty 2

## 2014-08-22 MED ORDER — SODIUM CHLORIDE 0.9 % IJ SOLN: 3.0000 mL | INTRAMUSCULAR | Status: DC | PRN

## 2014-08-22 MED ORDER — BISACODYL 5 MG PO TBEC
5.0000 mg | DELAYED_RELEASE_TABLET | Freq: Every day | ORAL | Status: DC | PRN
  Administered 2014-08-23 – 2014-08-25 (×3): 5 mg via ORAL
  Filled 2014-08-22 (×3): qty 1

## 2014-08-22 MED ORDER — LOSARTAN POTASSIUM 50 MG PO TABS
50.0000 mg | ORAL_TABLET | Freq: Every day | ORAL | Status: DC
  Administered 2014-08-22 – 2014-08-25 (×4): 50 mg via ORAL
  Filled 2014-08-22 (×4): qty 1

## 2014-08-22 MED ORDER — VANCOMYCIN HCL IN DEXTROSE 1-5 GM/200ML-% IV SOLN
1000.0000 mg | Freq: Two times a day (BID) | INTRAVENOUS | Status: DC
  Administered 2014-08-22 – 2014-08-25 (×6): 1000 mg via INTRAVENOUS
  Filled 2014-08-22 (×7): qty 200

## 2014-08-22 MED ORDER — POLYETHYLENE GLYCOL 3350 17 G PO PACK
17.0000 g | PACK | Freq: Every day | ORAL | Status: DC | PRN
  Administered 2014-08-25: 17 g via ORAL
  Filled 2014-08-22: qty 1

## 2014-08-22 MED ORDER — BUPIVACAINE LIPOSOME 1.3 % IJ SUSP
INTRAMUSCULAR | Status: DC | PRN
  Administered 2014-08-22: 10 mL

## 2014-08-22 MED ORDER — NITROGLYCERIN 0.4 MG SL SUBL: 0.4000 mg | SUBLINGUAL_TABLET | SUBLINGUAL | Status: DC | PRN

## 2014-08-22 MED ORDER — BUPIVACAINE LIPOSOME 1.3 % IJ SUSP
20.0000 mL | INTRAMUSCULAR | Status: DC
  Filled 2014-08-22: qty 20

## 2014-08-22 MED ORDER — POTASSIUM CHLORIDE IN NACL 20-0.9 MEQ/L-% IV SOLN
INTRAVENOUS | Status: DC
  Administered 2014-08-22 – 2014-08-23 (×2): via INTRAVENOUS
  Filled 2014-08-22 (×10): qty 1000

## 2014-08-22 MED ORDER — ACETAMINOPHEN 325 MG PO TABS
650.0000 mg | ORAL_TABLET | ORAL | Status: DC | PRN
  Administered 2014-08-23 – 2014-08-24 (×2): 650 mg via ORAL
  Filled 2014-08-22 (×2): qty 2

## 2014-08-22 MED ORDER — FENTANYL CITRATE (PF) 250 MCG/5ML IJ SOLN
INTRAMUSCULAR | Status: AC
  Filled 2014-08-22: qty 5

## 2014-08-22 MED ORDER — INSULIN ASPART 100 UNIT/ML ~~LOC~~ SOLN
4.0000 [IU] | Freq: Three times a day (TID) | SUBCUTANEOUS | Status: DC
  Administered 2014-08-25 (×2): 4 [IU] via SUBCUTANEOUS

## 2014-08-22 MED ORDER — OXYCODONE HCL 5 MG PO TABS
5.0000 mg | ORAL_TABLET | Freq: Once | ORAL | Status: DC | PRN
Start: 2014-08-22 — End: 2014-08-22

## 2014-08-22 MED ORDER — STERILE WATER FOR INJECTION IJ SOLN
INTRAMUSCULAR | Status: AC
  Filled 2014-08-22: qty 10

## 2014-08-22 MED ORDER — LOSARTAN POTASSIUM-HCTZ 50-12.5 MG PO TABS: 1.0000 | ORAL_TABLET | Freq: Every day | ORAL | Status: DC

## 2014-08-22 MED ORDER — EPHEDRINE SULFATE 50 MG/ML IJ SOLN
INTRAMUSCULAR | Status: DC | PRN
  Administered 2014-08-22 (×2): 10 mg via INTRAVENOUS

## 2014-08-22 MED ORDER — METHOCARBAMOL 1000 MG/10ML IJ SOLN
500.0000 mg | Freq: Four times a day (QID) | INTRAVENOUS | Status: DC | PRN
  Administered 2014-08-22: 500 mg via INTRAVENOUS
  Filled 2014-08-22 (×2): qty 5

## 2014-08-22 MED ORDER — INSULIN GLARGINE 100 UNIT/ML ~~LOC~~ SOLN
25.0000 [IU] | Freq: Two times a day (BID) | SUBCUTANEOUS | Status: DC
  Administered 2014-08-22 – 2014-08-25 (×6): 25 [IU] via SUBCUTANEOUS
  Filled 2014-08-22 (×8): qty 0.25

## 2014-08-22 MED ORDER — METHOCARBAMOL 500 MG PO TABS
500.0000 mg | ORAL_TABLET | Freq: Four times a day (QID) | ORAL | Status: DC | PRN
  Administered 2014-08-23 – 2014-08-25 (×5): 500 mg via ORAL
  Filled 2014-08-22 (×6): qty 1

## 2014-08-22 MED ORDER — VECURONIUM BROMIDE 10 MG IV SOLR
INTRAVENOUS | Status: DC | PRN
  Administered 2014-08-22: 2 mg via INTRAVENOUS
  Administered 2014-08-22: 4 mg via INTRAVENOUS
  Administered 2014-08-22: 2 mg via INTRAVENOUS

## 2014-08-22 MED ORDER — HYDROMORPHONE HCL 1 MG/ML IJ SOLN
0.5000 mg | INTRAMUSCULAR | Status: DC | PRN
  Administered 2014-08-22 (×2): 1 mg via INTRAVENOUS
  Filled 2014-08-22 (×2): qty 1

## 2014-08-22 MED ORDER — PROPOFOL 10 MG/ML IV BOLUS
INTRAVENOUS | Status: DC | PRN
  Administered 2014-08-22: 50 mg via INTRAVENOUS
  Administered 2014-08-22: 200 mg via INTRAVENOUS

## 2014-08-22 MED ORDER — EMPAGLIFLOZIN-LINAGLIPTIN 25-5 MG PO TABS
1.0000 | ORAL_TABLET | Freq: Every day | ORAL | Status: DC
  Administered 2014-08-23 – 2014-08-25 (×3): 1 via ORAL
  Filled 2014-08-22 (×2): qty 1

## 2014-08-22 MED ORDER — LIDOCAINE HCL (CARDIAC) 20 MG/ML IV SOLN
INTRAVENOUS | Status: DC | PRN
  Administered 2014-08-22: 100 mg via INTRAVENOUS
  Administered 2014-08-22: 40 mg via INTRAVENOUS

## 2014-08-22 MED ORDER — OXYCODONE HCL 5 MG/5ML PO SOLN: 5.0000 mg | Freq: Once | ORAL | Status: DC | PRN

## 2014-08-22 MED ORDER — METFORMIN HCL 850 MG PO TABS
850.0000 mg | ORAL_TABLET | Freq: Two times a day (BID) | ORAL | Status: DC
  Administered 2014-08-23 – 2014-08-25 (×5): 850 mg via ORAL
  Filled 2014-08-22 (×8): qty 1

## 2014-08-22 MED ORDER — PHENYLEPHRINE HCL 10 MG/ML IJ SOLN
INTRAMUSCULAR | Status: DC | PRN
  Administered 2014-08-22: 80 ug via INTRAVENOUS

## 2014-08-22 MED ORDER — PROMETHAZINE HCL 25 MG/ML IJ SOLN: 6.2500 mg | INTRAMUSCULAR | Status: DC | PRN

## 2014-08-22 MED ORDER — MORPHINE SULFATE 2 MG/ML IJ SOLN
1.0000 mg | INTRAMUSCULAR | Status: DC | PRN
  Administered 2014-08-22 (×4): 2 mg via INTRAVENOUS

## 2014-08-22 MED FILL — Sodium Chloride Irrigation Soln 0.9%: Qty: 3000 | Status: AC

## 2014-08-22 MED FILL — Sodium Chloride IV Soln 0.9%: INTRAVENOUS | Qty: 1000 | Status: AC

## 2014-08-22 MED FILL — Heparin Sodium (Porcine) Inj 1000 Unit/ML: INTRAMUSCULAR | Qty: 30 | Status: AC

## 2014-08-22 SURGICAL SUPPLY — 79 items
ADH SKN CLS APL DERMABOND .7 (GAUZE/BANDAGES/DRESSINGS) ×1
BAG DECANTER FOR FLEXI CONT (MISCELLANEOUS) ×1 IMPLANT
BLADE SURG ROTATE 9660 (MISCELLANEOUS) IMPLANT
BONE CANC CHIPS 20CC PCAN1/4 (Bone Implant) ×2 IMPLANT
BONE MATRIX VIVIGEN 1CC (Bone Implant) ×2 IMPLANT
BUR NEURO DRILL SOFT 3.0X3.8M (BURR) ×1 IMPLANT
BUR RND FLUTED 2.5 (BURR) ×1 IMPLANT
BUR SABER RD CUTTING 3.0 (BURR) IMPLANT
CAGE CONCORDE BULLET 9X8X27 (Cage) ×2 IMPLANT
CAGE CONCORDE BULLET 9X9X27 (Cage) ×1 IMPLANT
CAGE SPNL 5D BLT NOSE 27X9X8X (Cage) IMPLANT
CHIPS CANC BONE 20CC PCAN1/4 (Bone Implant) ×1 IMPLANT
CONNECTOR EXPEDIUM TI 55MM (Connector) ×2 IMPLANT
COVER SURGICAL LIGHT HANDLE (MISCELLANEOUS) ×2 IMPLANT
DERMABOND ADVANCED (GAUZE/BANDAGES/DRESSINGS) ×1
DERMABOND ADVANCED .7 DNX12 (GAUZE/BANDAGES/DRESSINGS) ×1 IMPLANT
DRAPE C-ARM 42X72 X-RAY (DRAPES) ×2 IMPLANT
DRAPE C-ARMOR (DRAPES) ×2 IMPLANT
DRAPE SURG 17X23 STRL (DRAPES) ×8 IMPLANT
DRAPE TABLE COVER HEAVY DUTY (DRAPES) ×2 IMPLANT
DRSG MEPILEX BORDER 4X4 (GAUZE/BANDAGES/DRESSINGS) ×1 IMPLANT
DRSG MEPILEX BORDER 4X8 (GAUZE/BANDAGES/DRESSINGS) ×1 IMPLANT
DURAPREP 26ML APPLICATOR (WOUND CARE) ×2 IMPLANT
ELECT BLADE 6.5 EXT (BLADE) IMPLANT
ELECT CAUTERY BLADE 6.4 (BLADE) ×2 IMPLANT
ELECT REM PT RETURN 9FT ADLT (ELECTROSURGICAL) ×2
ELECTRODE REM PT RTRN 9FT ADLT (ELECTROSURGICAL) ×1 IMPLANT
EVACUATOR 1/8 PVC DRAIN (DRAIN) ×1 IMPLANT
GAUZE SPONGE 4X4 12PLY STRL (GAUZE/BANDAGES/DRESSINGS) ×2 IMPLANT
GLOVE BIOGEL PI IND STRL 8 (GLOVE) ×1 IMPLANT
GLOVE BIOGEL PI INDICATOR 8 (GLOVE) ×1
GLOVE BIOGEL PI ORTHO PRO SZ7 (GLOVE) ×2
GLOVE ECLIPSE 9.0 STRL (GLOVE) ×2 IMPLANT
GLOVE ORTHO TXT STRL SZ7.5 (GLOVE) ×6 IMPLANT
GLOVE PI ORTHO PRO STRL SZ7 (GLOVE) IMPLANT
GLOVE SURG 8.5 LATEX PF (GLOVE) ×2 IMPLANT
GOWN STRL REUS W/ TWL LRG LVL3 (GOWN DISPOSABLE) ×1 IMPLANT
GOWN STRL REUS W/TWL 2XL LVL3 (GOWN DISPOSABLE) ×4 IMPLANT
GOWN STRL REUS W/TWL LRG LVL3 (GOWN DISPOSABLE) ×2
GRAFT BNE CANC CHIPS 1-8 20CC (Bone Implant) IMPLANT
KIT BASIN OR (CUSTOM PROCEDURE TRAY) ×2 IMPLANT
KIT POSITION SURG JACKSON T1 (MISCELLANEOUS) ×2 IMPLANT
KIT ROOM TURNOVER OR (KITS) ×2 IMPLANT
NDL ASP BONE MRW 11GX15 (NEEDLE) IMPLANT
NDL SPNL 18GX3.5 QUINCKE PK (NEEDLE) ×1 IMPLANT
NEEDLE 22X1 1/2 (OR ONLY) (NEEDLE) ×2 IMPLANT
NEEDLE ASP BONE MRW 11GX15 (NEEDLE) IMPLANT
NEEDLE BONE MARROW 8GAX6 (NEEDLE) IMPLANT
NEEDLE SPNL 18GX3.5 QUINCKE PK (NEEDLE) ×2 IMPLANT
NS IRRIG 1000ML POUR BTL (IV SOLUTION) ×2 IMPLANT
PACK LAMINECTOMY ORTHO (CUSTOM PROCEDURE TRAY) ×2 IMPLANT
PACK TRANSFER 300ML (TOM) (MISCELLANEOUS) ×2 IMPLANT
PAD ARMBOARD 7.5X6 YLW CONV (MISCELLANEOUS) ×4 IMPLANT
PATTIES SURGICAL .75X.75 (GAUZE/BANDAGES/DRESSINGS) ×2 IMPLANT
PATTIES SURGICAL 1X1 (DISPOSABLE) ×2 IMPLANT
ROD EXPEDIUM PREBENT 95MM (Rod) ×2 IMPLANT
SCREW SET SINGLE INNER (Screw) ×4 IMPLANT
SCREW VIPER CORT FIX 6.00X30 (Screw) ×1 IMPLANT
SCREW VIPER CORT FIX 6X35 (Screw) ×3 IMPLANT
SPONGE LAP 4X18 X RAY DECT (DISPOSABLE) ×1 IMPLANT
SPONGE SURGIFOAM ABS GEL 100 (HEMOSTASIS) ×2 IMPLANT
STRIP CLOSURE SKIN 1/2X4 (GAUZE/BANDAGES/DRESSINGS) ×1 IMPLANT
SURGIFLO TRUKIT (HEMOSTASIS) ×1 IMPLANT
SUT ETHILON 2 0 PSLX (SUTURE) ×1 IMPLANT
SUT VIC AB 0 CT1 27 (SUTURE) ×2
SUT VIC AB 0 CT1 27XBRD ANBCTR (SUTURE) ×1 IMPLANT
SUT VIC AB 1 CTX 36 (SUTURE) ×4
SUT VIC AB 1 CTX36XBRD ANBCTR (SUTURE) ×2 IMPLANT
SUT VIC AB 2-0 CT1 27 (SUTURE) ×4
SUT VIC AB 2-0 CT1 TAPERPNT 27 (SUTURE) ×1 IMPLANT
SUT VIC AB 3-0 X1 27 (SUTURE) ×3 IMPLANT
SUT VICRYL 0 CT 1 36IN (SUTURE) ×4 IMPLANT
SYR 20CC LL (SYRINGE) ×2 IMPLANT
SYR CONTROL 10ML LL (SYRINGE) ×4 IMPLANT
TOWEL OR 17X24 6PK STRL BLUE (TOWEL DISPOSABLE) ×2 IMPLANT
TOWEL OR 17X26 10 PK STRL BLUE (TOWEL DISPOSABLE) ×2 IMPLANT
TRAY FOLEY CATH 16FRSI W/METER (SET/KITS/TRAYS/PACK) ×2 IMPLANT
WATER STERILE IRR 1000ML POUR (IV SOLUTION) ×1 IMPLANT
YANKAUER SUCT BULB TIP NO VENT (SUCTIONS) ×2 IMPLANT

## 2014-08-22 NOTE — Progress Notes (Signed)
ANTIBIOTIC CONSULT NOTE - INITIAL  Pharmacy Consult for vancomycin Indication: surgical prophylaxis  Allergies  Allergen Reactions  . Adhesive [Tape] Rash  . Dilaudid [Hydromorphone Hcl] Nausea And Vomiting  . Ampicillin Rash    REACTION: Rash and welts on her hands when she took it in the hospital  . Penicillins Rash    Patient Measurements: Height: 5\' 5"  (165.1 cm) Weight: 204 lb (92.534 kg) IBW/kg (Calculated) : 57 Vital Signs: Temp: 97.6 F (36.4 C) (05/13 1657) Temp Source: Oral (05/13 0547) BP: 131/66 mmHg (05/13 1649) Pulse Rate: 72 (05/13 1657) Intake/Output from this shift: Total I/O In: 4500 [I.V.:3350; Blood:400; IV Piggyback:750] Out: 3500 [Urine:2300; Blood:1200]  Labs: No results for input(s): WBC, HGB, PLT, LABCREA, CREATININE in the last 72 hours. Estimated Creatinine Clearance: 78.8 mL/min (by C-G formula based on Cr of 0.79). No results for input(s): VANCOTROUGH, VANCOPEAK, VANCORANDOM, GENTTROUGH, GENTPEAK, GENTRANDOM, TOBRATROUGH, TOBRAPEAK, TOBRARND, AMIKACINPEAK, AMIKACINTROU, AMIKACIN in the last 72 hours.   Microbiology: Recent Results (from the past 720 hour(s))  Surgical pcr screen     Status: Abnormal   Collection Time: 08/18/14  4:43 PM  Result Value Ref Range Status   MRSA, PCR NEGATIVE NEGATIVE Final   Staphylococcus aureus POSITIVE (A) NEGATIVE Final    Comment:        The Xpert SA Assay (FDA approved for NASAL specimens in patients over 44 years of age), is one component of a comprehensive surveillance program.  Test performance has been validated by [facility] for patients greater than or equal to 48 year old. It is not intended to diagnose infection nor to guide or monitor treatment.     Medical History: Past Medical History  Diagnosis Date  . GERD (gastroesophageal reflux disease)   . Hypothyroidism   . Peripheral neuropathy   . Cardiomyopathy   . Chest pain 06/2005    Hospitalized, dystolic dysfunction  . Sciatica    . Type II diabetes mellitus   . PONV (postoperative nausea and vomiting)   . H/O hiatal hernia   . Migraines     "it's been a long time since I've had one" (03/20/2012)  . Osteoarthritis   . Arthritis     "all over; mostly in my back" (03/20/2012)  . Chronic lower back pain     "have 2 herniated discs; going to have to have a fusion" (03/20/2012)  . Breast cancer     "right" (03/20/2012)  . Family history of anesthesia complication     "father has nausea and vomiting"  . Irritable bowel syndrome   . Hypertension     "patient states never had HTN, takes Hyzaar for heart  . History of right mastectomy   . Anginal pain 03/19/2012    saw Dr. Cathie Olden .. she thinks its more related to stomach issues  . History of kidney stones     Medications:  Anti-infectives    Start     Dose/Rate Route Frequency Ordered Stop   08/22/14 0600  vancomycin (VANCOCIN) IVPB 1000 mg/200 mL premix     1,000 mg 200 mL/hr over 60 Minutes Intravenous On call to O.R. 08/21/14 1312 08/22/14 0820     Assessment: 64 year old female s/p lumbar fusion with screw placement and Hemovac drain in R-lumbar placed to receive vancomycin for surgical prophylaxis due to penicillin allergy. Pre-op dose was given at 0720 AM. No cultures done. Afebrile. SCr 0.79 pre-op with estimated CrCl ~ 80 mL/min.   Goal of Therapy:  Vancomycin trough level  10-15 mcg/ml  Plan:  Vancomycin 1g IV every 12 hours- next dose due 1900.  Follow-up renal function, clinical status, and drain removal for duration of therapy.    Brain Hilts 08/22/2014,5:26 PM

## 2014-08-22 NOTE — H&P (Signed)
[name] is an 64 y.o. female.   [name] is seen today for followup of her back.  She is experiencing pain in her right hip and weakness in her right thigh and right knee.  [name] is status post microdiscectomy on the right side at the L2-3 level for a herniated disk above an L3 to L5 fusion.  She was found to have extruded HNP with right L3 nerve compression.  Central stenosis at L2-3.  She underwent decompression centrally at L2-3 with microdiscectomy and that surgery done 02/04/2014.  Postoperatively she did well for a few weeks and then began experiencing worsening pain and discomfort.     She underwent recent MR study.  It demonstrates some spinal stenosis associated with retrolisthesis at L2 on L3 and recurrent disk protrusion into the right lateral recess affecting the right L2-L3 nerve roots.  She also has a synovial cyst off the right side, L2-3 facet, which extends into the neural foramen causing compression posterior to anterior.  This combines for a moderately severe lateral recess and spinal stenosis along the right side at L2-3.  She has severe degenerative disk changes at L2-3 with some retrolisthesis at this level.          Past Medical History  Diagnosis Date  . GERD (gastroesophageal reflux disease)   . Hypothyroidism   . Peripheral neuropathy   . Cardiomyopathy   . Chest pain 06/2005    Hospitalized, dystolic dysfunction  . Sciatica   . Type II diabetes mellitus   . PONV (postoperative nausea and vomiting)   . H/O hiatal hernia   . Migraines     "it's been a long time since I've had one" (03/20/2012)  . Osteoarthritis   . Arthritis     "all over; mostly in my back" (03/20/2012)  . Chronic lower back pain     "have 2 herniated discs; going to have to have a fusion" (03/20/2012)  . Breast cancer     "right" (03/20/2012)  . Family history of anesthesia complication     "father has nausea and vomiting"  . Irritable bowel syndrome   . Hypertension     "patient  states never had HTN, takes Hyzaar for heart  . History of right mastectomy   . Anginal pain 03/19/2012    saw Dr. Cathie Olden .. she thinks its more related to stomach issues  . History of kidney stones     Past Surgical History  Procedure Laterality Date  . Abdominal hysterectomy  1993  . Total knee arthroplasty  2000    Right  . Mastectomy with axillary lymph node dissection  12/2003    "right" (03/20/2012)  . Thyroidectomy  1977    Right  . Posterior cervical fusion/foraminotomy  2001  . Shoulder arthroscopy w/ rotator cuff repair  2003    Left  . Cardiac catheterization  ? 2005 / 2007  . Adenosin cardiolite  07/2005    (+) wall motion abnormality  . Esophagogastroduodenoscopy  2005    GERD  . [place] of abdomen  07/2005    Fatty liver / kidney stones  . Ct abdomen and pelvis  02/2010    Same  . Knee arthroscopy  1990's    "right" (03/20/2012)  . Axillary lymph node dissection  2003    squamous cell cancer  . Back surgery    . Lumbar laminectomy/decompression microdiscectomy N/A 02/04/2014    Procedure: RIGHT L2-3 MICRODISCECTOMY ;  Surgeon: Jessy Oto, MD;  Location: [facility]  OR;  Service: Orthopedics;  Laterality: N/A;    Family History  Problem Relation Age of Onset  . Hypertension Mother   . Diabetes Father   . Hypertension Father   . Hypertension Sister   . Diabetes Other     Family Hx. of Diabetes  . Hypertension Other     Family History of HTN  . Hypertension Brother   . Hypertension Brother   . Diabetes Brother    Social History:  reports that she has never smoked. She has never used smokeless tobacco. She reports that she does not drink alcohol or use illicit drugs.  Allergies:  Allergies  Allergen Reactions  . Adhesive [Tape] Rash  . Dilaudid [Hydromorphone Hcl] Nausea And Vomiting  . Ampicillin Rash    REACTION: Rash and welts on her hands when she took it in the hospital  . Penicillins Rash    Facility-administered medications prior to admission   Medication Dose Route Frequency Provider Last Rate Last Dose  . nitroGLYCERIN (NITROSTAT) SL tablet 0.3 mg  0.3 mg Sublingual Q5 min PRN Susy Frizzle, MD       Medications Prior to Admission  Medication Sig Dispense Refill  . acetaminophen (TYLENOL) 325 MG tablet Take 650 mg by mouth every 6 (six) hours as needed for mild pain.    [name] Kitchen aspirin 81 MG tablet Take 81 mg by mouth daily.    . Cholecalciferol (VITAMIN D) 2000 UNITS CAPS Take 4,000 Units by mouth daily.    [name] Kitchen docusate sodium 100 MG CAPS Take 100 mg by mouth 2 (two) times daily. (Patient taking differently: Take 100 mg by mouth 2 (two) times daily as needed (constipation). ) 10 capsule   . Empagliflozin-Linagliptin 25-5 MG TABS Take 1 tablet by mouth daily.    [name] Kitchen gabapentin (NEURONTIN) 300 MG capsule Take 1 capsule (300 mg total) by mouth 3 (three) times daily. (Patient taking differently: Take 300 mg by mouth daily as needed (nerve pain). ) 90 capsule 3  . ibuprofen (ADVIL,MOTRIN) 200 MG tablet Take 600 mg by mouth every 4 (four) hours as needed for moderate pain.    [name] Kitchen insulin glargine (LANTUS) 100 unit/mL SOPN Inject 0.45 mLs (45 Units total) into the skin 2 (two) times daily. 15 mL 5  . Insulin Pen Needle (PEN NEEDLES 5/16") 30G X 8 MM MISC Use to inject insulin 5x daily. 200 each 11  . levothyroxine (SYNTHROID, LEVOTHROID) 100 MCG tablet Take 1 tablet (100 mcg total) by mouth daily. 30 tablet 11  . losartan-hydrochlorothiazide (HYZAAR) 50-12.5 MG per tablet Take 1 tablet by mouth daily. 30 tablet 11  . metFORMIN (GLUCOPHAGE) 850 MG tablet Take 1 tablet (850 mg total) by mouth 2 (two) times daily with a meal. 60 tablet 3  . methocarbamol (ROBAXIN) 500 MG tablet Take 1 tablet (500 mg total) by mouth every 6 (six) hours as needed for muscle spasms. 40 tablet 1  . OVER THE COUNTER MEDICATION Take 20 mLs by mouth daily as needed (acid reflux). Good Sense Anti Acid Solution    . oxyCODONE-acetaminophen (PERCOCET/ROXICET) 5-325 MG per  tablet Take 1 tablet by mouth every 6 (six) hours as needed for severe pain. 15 tablet 0  . polyethylene glycol (MIRALAX / GLYCOLAX) packet Take 17 g by mouth daily as needed (constipation).     . promethazine (PHENERGAN) 25 MG tablet Take 1 tablet (25 mg total) by mouth every 8 (eight) hours as needed for nausea or vomiting. 20 tablet 2  . ranitidine (  ZANTAC) 150 MG tablet Take 150 mg by mouth as needed for heartburn.    . insulin aspart (NOVOLOG) 100 UNIT/ML FlexPen Inject 15 Units into the skin 3 (three) times daily with meals. (Patient not taking: Reported on 08/18/2014) 5 pen 11  . meloxicam (MOBIC) 15 MG tablet Take 15 mg by mouth daily.   4    Results for orders placed or performed during the hospital encounter of 08/22/14 (from the past 48 hour(s))  Glucose, capillary     Status: Abnormal   Collection Time: 08/22/14  5:52 AM  Result Value Ref Range   Glucose-Capillary 136 (H) 65 - 99 mg/dL   No results found.  Review of Systems  Unable to perform ROS Constitutional: Negative for fever, chills, weight loss, malaise/fatigue and diaphoresis.  HENT: Negative for congestion, ear discharge, ear pain, hearing loss, nosebleeds, sore throat and tinnitus.   Eyes: Negative for blurred vision, double vision, photophobia, pain, discharge and redness.  Respiratory: Negative for cough, hemoptysis, sputum production, shortness of breath, wheezing and stridor.   Cardiovascular: Positive for chest pain. Negative for palpitations, orthopnea, claudication, leg swelling and PND.  Gastrointestinal: Positive for nausea and vomiting. Negative for heartburn, abdominal pain, diarrhea, constipation, blood in stool and melena.  Genitourinary: Negative for dysuria, urgency, frequency, hematuria and flank pain.  Musculoskeletal: Positive for back pain. Negative for myalgias, joint pain, falls and neck pain.  Skin: Negative for itching and rash.  Neurological: Positive for tingling, sensory change, focal weakness  and weakness. Negative for dizziness, tremors, speech change, seizures, loss of consciousness and headaches.  Endo/Heme/Allergies: Negative for environmental allergies and polydipsia. Does not bruise/bleed easily.  Psychiatric/Behavioral: Negative for depression, suicidal ideas, hallucinations, memory loss and substance abuse. The patient is not nervous/anxious and does not have insomnia.     Blood pressure 155/68, pulse 64, temperature 97.9 F (36.6 C), temperature source Oral, height 5\' 5"  (1.651 m), weight 92.534 kg (204 lb), SpO2 99 %. Physical Exam  Constitutional: She is oriented to person, place, and time. She appears well-developed and well-nourished. No distress.  HENT:  Head: Normocephalic and atraumatic.  Right Ear: External ear normal.  Left Ear: External ear normal.  Nose: Nose normal.  Mouth/Throat: Oropharynx is clear and moist.  Eyes: Conjunctivae and EOM are normal. Pupils are equal, round, and reactive to light. Right eye exhibits no discharge. Left eye exhibits no discharge. No scleral icterus.  Neck: Normal range of motion. Neck supple. No JVD present. No tracheal deviation present. No thyromegaly present.  Cardiovascular: Normal rate, regular rhythm, normal heart sounds and intact distal pulses.  Exam reveals no gallop and no friction rub.   No murmur heard. Respiratory: Effort normal and breath sounds normal. No stridor. No respiratory distress. She has no wheezes. She has no rales. She exhibits no tenderness.  GI: Soft. Bowel sounds are normal. She exhibits no distension and no mass. There is no tenderness. There is no rebound and no guarding.  Musculoskeletal: She exhibits tenderness. She exhibits no edema.  Lymphadenopathy:    She has no cervical adenopathy.  Neurological: She is alert and oriented to person, place, and time. She displays abnormal reflex. No cranial nerve deficit. She exhibits abnormal muscle tone. Coordination normal.  Skin: Skin is warm and dry. No  rash noted. She is not diaphoretic. No erythema. No pallor.  Psychiatric: She has a normal mood and affect. Her behavior is normal. Judgment and thought content normal.    PHYSICAL EXAMINATION:  Her clinical exam shows  that she is weak in right hip flexion and is 4/5.  Right knee extension strength is 4-/5.  Foot dorsiflexion and plantar flexion strength are normal.  Sciatic tension tests are negative.     ASSESSMENT:  We had a discussion regarding the results of her study.  She wanted to know about the synovial cyst and wanted to know if she would be having problems with a cauda equina syndrome.  I discussed this with her and showed her the studies.  She has enough room for her nerves to go to bowel and bladder so I doubt that cauda equina is a concern, however, certainly she is at risk of having nerve compression here and these may be irritated.  From my standpoint I think that this is a situation where I think decompression alone was not adequate.  We had discussed considering fusion in her first surgery and she did not find that acceptable.  Today I think that it is reasonable to consider going ahead with the decompression surgery on her back and extending her fusion then up to the L2-3 level.     In reviewing her study, though, there is disk degeneration at L1-2 with disk bulge at this segment as well in light of degeneration at L1-2 the fusion should be taken to the L1-2 level and extended superiorly from L3 to L1.  This would include a TLIF at the L4-5 level, but not necessarily at L3-4.  I think that a posterior lateral fusion would be adequate at this segment.     PLAN:  The risks of surgery including the risk of infection, bleeding and neurologic compromise discussed with [name].  We have planned for right-sided TLIF at the L2-3 level and instrumentation posteriorly with pedicle screws and rods and posterolateral fusion from L1 to L3.  The risks of surgery including infection, bleeding,  neurologic compromise discussed.  We did discuss an additional level and she understands that this would be done in order to improve her chances of not having to return to surgery as she is already showing degeneration at this segment and likelihood of wear is greater than 75% at this adjacent level.  Medications of oxycodone 5/325 renewed.  OWENS,Stanlee Roehrig M 08/22/2014, 6:57 AM

## 2014-08-22 NOTE — Anesthesia Procedure Notes (Signed)
Procedure Name: Intubation Date/Time: 08/22/2014 7:43 AM Performed by: Jacquiline Doe A Pre-anesthesia Checklist: Patient identified, Timeout performed, Emergency Drugs available, Suction available and Patient being monitored Patient Re-evaluated:Patient Re-evaluated prior to inductionOxygen Delivery Method: Circle system utilized Preoxygenation: Pre-oxygenation with 100% oxygen Intubation Type: IV induction and Cricoid Pressure applied Ventilation: Mask ventilation without difficulty and Oral airway inserted - appropriate to patient size Laryngoscope Size: Mac and 4 Grade View: Grade I Tube type: Oral Tube size: 7.5 mm Number of attempts: 1 Airway Equipment and Method: Stylet Placement Confirmation: ETT inserted through vocal cords under direct vision,  breath sounds checked- equal and bilateral and positive ETCO2 Secured at: 22 cm Tube secured with: Tape Dental Injury: Teeth and Oropharynx as per pre-operative assessment

## 2014-08-22 NOTE — Anesthesia Postprocedure Evaluation (Signed)
  Anesthesia Post-op Note  [name]  Procedure(s) Performed: Procedure(s): Right L2-3 and right L1-2 transforaminal lumbar interbody fusion with pedicle screws, rods, sleeves, cages, local bone graft, Vivigen, cancellous chips (N/A)  Patient Location: PACU  Anesthesia Type:General  Level of Consciousness: awake, alert , oriented and patient cooperative  Airway and Oxygen Therapy: Patient Spontanous Breathing and Patient connected to nasal cannula oxygen  Post-op Pain: mild  Post-op Assessment: Post-op Vital signs reviewed, Patient's Cardiovascular Status Stable, Respiratory Function Stable, Patent Airway, No signs of Nausea or vomiting and Pain level controlled  Post-op Vital Signs: Reviewed and stable  Last Vitals:  Filed Vitals:   08/22/14 1645  BP:   Pulse: 70  Temp:   Resp: 11    Complications: No apparent anesthesia complications

## 2014-08-22 NOTE — Op Note (Addendum)
08/22/2014  4:44 PM  PATIENT:  [name]  64 y.o. female  MRN: [id number]  OPERATIVE REPORT  PRE-OPERATIVE DIAGNOSIS:  Recurrent herniated nucleus pulposus and spondylolisthesis L2-3, Degenerative Disc Disease L1-2  POST-OPERATIVE DIAGNOSIS:  Recurrent herniated nucleus pulposus and spondylolisthesis   PROCEDURE:  Procedure(s): Right L2-3 and right L1-2 transforaminal lumbar interbody fusion with pedicle screws, rods, sleeves, cages, local bone graft, Vivigen, cancellous chips    SURGEON:  Jessy Oto, MD     ASSISTANT:  Benjiman Core, PA-C  (Present throughout the entire procedure and necessary for complJames Owensetion of procedure in a timely manner)     ANESTHESIA:  General, supplemented with local marcaine 0.5% 1:1 exparel 1.3% total 30cc, Dr. Ola Spurr.    COMPLICATIONS:  None.  DRAINS: Foley to SD, Hemovac x 1 right lumbar.     COMPONENTS:  Implant Name Type Inv. Item Serial No. Manufacturer Lot No. LRB No. Used  BONE MATRIX VIVIGEN 1CC - [id number] Bone Implant BONE MATRIX VIVIGEN 1CC [id number] [facility]  N/A 1  BONE MATRIX VIVIGEN 1CC - [id number] Bone Implant BONE MATRIX VIVIGEN 1CC [id number] [facility]  N/A 1  BONE CANC CHIPS 20CC - [id number] Bone Implant BONE CANC CHIPS 20CC [id number] [facility]  N/A 1  SCREW VIPER CORT FIX 6.00X30 - [id number] Screw SCREW VIPER CORT FIX 6.00X30  DEPUY SPINE  N/A 1  SCREW VIPER CORT FIX 4.35X21 - [id number] Screw SCREW VIPER CORT FIX 4.35X21  DEPUY SPINE  N/A 3  ROD EXPEDIUM PREBENT 95MM - [id number] Rod ROD EXPEDIUM PREBENT 95MM  DEPUY SPINE  N/A 2  SCREW SET SINGLE INNER - [id number] Screw SCREW SET SINGLE INNER  DEPUY SPINE  N/A 4  CAGE CONCORDE BULLET 9X8X27MM - [id number] Cage CAGE CONCORDE BULLET 9X8X27MM  DEPUY SPINE  N/A 1  CONNECTOR EXPEDIUM 5.5X5.5-V - [id number] Connector CONNECTOR EXPEDIUM 5.5X5.5-V  DEPUY SPINE  N/A 2  CAGE CONCORDE BULLET 9X9X27  - [id number] Cage CAGE CONCORDE BULLET 9X9X27   DEPUY SPINE   N/A 1     PROCEDURE:    The patient was met in the holding area, and the appropriate Right L1-2 and L2-3 lumbar levels identified and marked with an "X" and my initials. I had discussion with the patient in the preop holding area regarding consent form. Patient understands the rationale for the fusion site as the L1-2 and L2-3 segment For stenosis and degenerative spondylolisthesis with recurrent herniated disc right L2-3 above previous fusion L3 to L5  The patient was then transported to OR and was placed under general anestheticwithout difficulty. The patient received appropriate preoperative antibiotic prophylaxis vancomycin for ampicillin allergy. . Nursing staff inserted a Foley catheter under sterile conditions. The patient was then turned to a prone position using the [place] spine frame. PAS. all pressure points well padded the arms at the side to 90 90. Standard prep with DuraPrep solution draped in the usual manner from the lower dorsal spine the mid sacral segment. Iodine Vi-Drape was used and the incision was marked. Time-out procedure was called and correct. Loupe magnification and headlight were used during this portion procedure.  Skin in the midline between L2and L4 was then infiltrated with local marcaine 0.5% 1:1 exparel 1.3% total of 30 cc used. Incision was then made through the skin and subcutaneous layers down to the patient's lumbodorsal fascia and spinous processes. The incision then carried sharply excising the supraspinous ligament and then continuing the lateral aspect of the  spinous processes T12,L1 and L2 and carried lateral over the lateral facets at L2-3 and L4 and L5.Belenda Cruise elevators used to carefully elevate the paralumbar muscles off of the posterior elements using electrocautery carefully drilled bleeding and perform dissection of the muscle tissues of the preserving the facet at T12-L1. Continuing the exposure  out laterally to expose the retained pedicle screws and rods at L3, L4 and  L5. Bleeding controlled using electrocautery monopolar electrocautery.  Viper retractor was used for the upper part of the incision.  C-arm fluoroscopy was then brought into the field and using C-arm fluoroscopy then a hole made into the lateral aspect of the right pedicle of L2 observed in the pedicle using ball-tipped nerve hook and hockey stick nerve probe initial entry was determined on fluoroscopy to be good position alignment so that a ball handled probe was then used to probe the right L2 pedicle to a depth of nearly 35 mm observed on C-arm fluoroscopy to be beyond the midpoint of the lumbar vertebra and then position alignment within the right L2 pedicle this was then removed and the pedicle channel probed demonstrating patency no sign of rupture the cortex of the pedicle. Tapping with a 5 mm screw then 6.0 mm x 35 mm screw was placed on the table for use st the right side at the L2 level following TLIF. C-arm fluoroscopy was then brought into the field and using C-arm fluoroscopy then a hole made into the lateral aspect of the pedicle of L2 observed in the pedicle using ball tipped nerve hook and hockey stick nerve probe initial entry was determined on fluoroscopy to be good position alignment so that a ball handled probe was then used to probe the left L2 pedicle opening to a depth of nearly 30 mm observed on C-arm fluoroscopy to be beyond the midpoint of the lumbar vertebra and then position alignment within the left L2 pedicle this was then removed and the pedicle channel probed demonstrating patency no sign of rupture the cortex of the pedicle. Tapping with a 6 mm screw tap then 6.0 mm x 30 mm screw was placed on the table for use at the left side at the L2 level following TLIF.C-arm fluoroscopy was then brought into the field and using C-arm fluoroscopy then a hole made into the lateral aspect of the right pedicle of L1  observed in the pedicle using ball tipped nerve hook and hockey stick nerve probe initial entry was determined on fluoroscopy to be good position alignment so that a ball handled probe was then used to probe the right L1 pedicle to a depth of nearly 35 mm observed on C-arm fluoroscopy to be well aligned within the right L1 pedicle, the pedicle channel probed demonstrating patency no sign of rupture the cortex of the pedicle. Tapping with a 6 mm screw then 6.0 mm x 35 mm screw was placed  on the table for use at the right side at the L1 level following TLIF. C-arm fluoroscopy was used to localize the hole made in the lateral aspect of the pedicle of L1 on the left localizing the pedicle within the spinal canal with nerve hook and hockey-stick nerve probe carefully passed down the center of the L1 pedicle to a depth of nearly 35 mm. Observed on C-arm fluoroscopy to be in good position alignment channel was probed with a ball-tipped probe ensure patency no sign of cortical disruption. Following tapping with a 5 mm tap and a 15m tap a 6.0  x 35 mm screw was placed  on the table for use st the left side at the L1 level following TLIF.  The right inferior articular process of L1and L2 were resected in order to provide for exposure of the right side L1-2 and L2-3 neuroforamen for ease of placement of TLIFs (transforaminal lumbar interbody fusion) essential portions of the lamina were also resected first beginning with the Leksell rongeur and then resecting using 2 and 3 mm Kerrison the central and right portions of the lamina of L1 and L2 performing foraminotomies on the right side at the L3 level. The inferior articular process L1 and L2 were resected on the right side. The L3 nerve root identified bilaterally and the medial aspect of the L3 pedicle. Superior articular process of L3 was then resected from the right side further decompressing the right L3 nerve and providing for exposure of the area just superior to the  L3 pedicle for a placement of cage. A large disc herniation was found on the right side at the L2-3 level and this was resected. The OR microscope was draped sterilely and brought into the field to allow for freeing up of the right side of the thecal sac at the L2-3 Level elevating the L3 nerve root away from the medial right L3 pedicle, exposing the right L2-3 disc above the right L3 pedicle, Incising the disc and then resecting the disc with micropituitary and then pituitary with teeth.The right side recut resecting the superior articular process of L3 overlying the L2 nerve root as it exited at the L2-3 level decompressing the lateral recess along the medial aspect of the pedicle of L3 and resecting the superior to the process of L3 and decompressing the right L2 neuroforamen.The disc herniation did continue subligamentous along the right side of the Posterior upper vertebral body of L3 ventral to the right L3 nerve root. The posterior longitudinal ligament was incised and the disc herniation material resected with nerve hook and micropituitary. A blunt nerve hook used to explore the spinal canal and ensure Both the right L3 and L2 nerve roots were well decompressed.At the L2 level similarly lateral recess and the neuroforamen were resected decompressing the L1and L2 nerve roots and foraminotomies was widely performed over the right L1 nerve and L2 nerve roots.  Attention then turned to placement of the right transforaminal lumbar interbody fusion cages. Using a Penfield 4 the lateral aspect of the thecal sac and the inferior aspect of the L3 nerve root on the right side at the L2-3 level was carefully drilled. The thecal sac could then easily be retracted in the posterior lateral aspect of the L2-3 disc was exposed 15 blade scalpel used to incise the osteotome used to resect a small portion of bone off the superior aspect of the posterior superior vertebral body of L2 in order to ease the entry into the  L2-3 disc space. A pituitary rongeur was then able to be introduced in the disc space debrided it of degenerative disc material. 7 mm dilator was used to dialate  the L2-3 disc space on the right side attempts were made to dilate further in increments to 58m successfully and using small curettes and the disc space was debrided a minimal degenerative disc present in the endplates debrided to bleeding endplate bone.Trial cage placement within the disc place could only allow for an 8 mm cage. An 8 mm lordotic Concorde cage was carefully packed with morcellized bone graft and the been harvested from previous  laminotomies additional bone graft local morselized, cancellous allograft chips and vivigen was then packed into the intervertebral disc space using the 7 mm trial to impacted the graft multiple times. With this then a 8 mm x 27 mm lorditic cage was introdudced into the disc space on the right side in the correct degree convergence and then impacted then subset beneath the posterior aspect of the disc space by about 3 or 4 mm. Bleeding controlled using bipolar electrocautery thrombin soaked gel cottonoids. Then turned to the right L1-2 level similarly the exposure the posterior lateral aspect this was carried out using a Penfield 4 bipolar electrocautery to control small bleeders present. Derricho retractor used to retract the thecal sac and nerve root, a 15 blade scalpel was used to incise posterior lateral aspect of the disc at the L1-2 disc space.The space was debrided of degenerative disc material using pituitary along root the entire disc space was then debrided of degenerative disc material using pituitary rongeurs curettage down to bleeding bone endplates. Residual disc was resected using pituitary. This space was then carefully sounded to a 9 mm cage trial provided the best fit the lordotic cage was chosen the 9 mm x 27 mm. The intervertebral disc space was then packed with autogenous local bone graft that  been harvested from the central laminectomy, cancellous allograft chips and vivigen and the trial was used to pack the graft using an 41m trial cage. This provided excellent bone graft within the intervertebral disc space at L1-2 so that the permanent 9 mm cage by 27 mm was then packed with local bone graft cancellous allograft chips and vivigen  placed into the intervertebral disc space and impacted into place in the correct degree of convergence. Bleeding controlled using bipolar electrocautery. The cage was subset beneath the posterior aspect of the about 3-4 mm .   Bleeding was hemostasis attention was turned to the placement of the rods and Rod sleeves . The exposed retained hardware bilateral L3 to L5 pedicle screw fasteners and rods. The crosslink was removed at the L4-5 level as there was not enough space between the fasteners at L3-L4 to allow for placement of a connector sleeve so the Crosslink was removed and the connecting sleeve connected to the retained rod at the L4-5 level. With the transforaminal lumbar interbody fusion portion of the case  completed bleeders were controlled using bipolar electrocautery thrombin-soaked Gelfoam were appropriate. 2 pedicle screws on the left and 2  Pedicle screws on the right and then each carefully placed and aligned to allow for placement of rods. The 953mprebent rod was then contoured using a template on the left side and  placed into the pedicle screws on the left extending from L4-5 rod connector sleeve into each of the left L2 and L1 pedicle fastener and caps carefully placed loosely tightened. Attention turned to the right side were similarly screws were carefully adjusted to allow for a better pattern screws to allow for placement of fixation rod template for the rod was then taken and a precontoured quarter inch 9566mitanium rod was placed. The rod placed into the connector sleeve at the L4-5 level and reduced into the right L2 and L1 pedicle  fasteners. Both rod connector sleeve screws were tightened to 80 foot lbs. The right L1 rod fastener cap was then tightened 85 pounds similarly on  At the right side disc space at L1-2 screw fasteners distracted to compress the L2-3 disc Posteriorly and the L2 fastener cap was  torqued to 85 foot lbs, the right side between L1 and L2 then compressed by loosening the cap at the L1 level, using the compressor and tightening the right L1 cap to 85 foot lbs.  Similarly this was done on the left side at L1-2 and L4-5 sleeve screws were compressed and tightened 85 pounds. Irrigation was carried out with copious amounts of saline solution this was done throughout the case. Cell Saver was used during the case.Permanent C-arm images were obtained in AP and lateral and oblique planes. Remaining local bone graft was then applied along the left and right lateral posterior lateral region extending from L1 through L3 posterior facets following decortication of the facets and along the left L1-2 interlaminar area posteriorly. Excess Gelfoam was then removed the lumbodorsal musculature carefully exam debrided of any devitalized tissue following removal of Vicryl retractors were the bleeders were controlled using electrocautery and the area dorsal lumbar muscle were then approximated in the midline with interrupted #1 Vicryl sutures loose the dorsal fascia was reattached to the spinous process of T12, L1 and L2 to superiorly and  inferiorly this was done with #1 Vicryl sutures. The para lumbar muscle approximated over the lower incision site with #1 vicryl A Hemovac drain was placed along the left posterior elements exiting over the right lower lumbar spine. The subcutaneous layers then approximated over the drain using interrupted 0 Vicryl sutures and 2-0 Vicryl sutures. Skin was closed with a running subcutaneous stitch of 4-0 Vicryl Dermabond was applied then MedPlex bandage. All instrument and sponge counts were correct. The  patient was then returned to a supine position on her bed reactivated extubated and returned to the recovery room in satisfactory condition.    Benjiman Core, PA-C perform the duties of assistant surgeon during this case. He was present from the beginning of the case to the end of the case assisting in transfer the patient from his stretcher to the OR table and back to the stretcher at the end of the case. Assisted in careful retraction and suction of the laminectomy site delicate neural structures operating under the operating room microscope. He performed closure of the incision from the fascia to the skin applying the dressing.     NITKA,JAMES E  08/22/2014, 4:44 PM

## 2014-08-22 NOTE — Transfer of Care (Signed)
Immediate Anesthesia Transfer of Care Note  Patient: ADRIHANNA KARON  Procedure(s) Performed: Procedure(s): Right L2-3 and right L1-2 transforaminal lumbar interbody fusion with pedicle screws, rods, sleeves, cages, local bone graft, Vivigen, cancellous chips (N/A)  Patient Location: PACU  Anesthesia Type:General  Level of Consciousness: sedated, patient cooperative and responds to stimulation  Airway & Oxygen Therapy: Patient Spontanous Breathing and Patient connected to face mask oxygen  Post-op Assessment: Report given to RN, Post -op Vital signs reviewed and stable, Patient moving all extremities and Patient moving all extremities X 4  Post vital signs: Reviewed and stable  Last Vitals:  Filed Vitals:   08/22/14 0555  BP: 155/68  Pulse:   Temp:     Complications: No apparent anesthesia complications

## 2014-08-22 NOTE — Brief Op Note (Signed)
08/22/2014  1:51 PM  PATIENT:  [name]  64 y.o. female  PRE-OPERATIVE DIAGNOSIS:  Recurrent herniated nucleus pulposus and spondylolisthesis L2-3, Degenerative Disc Disease L1-2  POST-OPERATIVE DIAGNOSIS:  Recurrent herniated nucleus pulposus and spondylolisthesis   PROCEDURE:  Procedure(s): Right L2-3 and right L1-2 transforaminal lumbar interbody fusion with pedicle screws, rods, sleeves, cages, local bone graft, Vivigen, cancellous chips (N/A)  SURGEON:  Surgeon(s) and Role: Jessy Oto, MD - Primary  PHYSICIAN ASSISTANT: Benjiman Core, PA-C  ANESTHESIA:   local and general Dr. Tamala Julian.  EBL:  Total I/O In: 4100 [I.V.:2950; Blood:400; IV Piggyback:750] Out: 1950 [Urine:750; Blood:1200]  BLOOD ADMINISTERED:250 CC CELLSAVER  DRAINS: (One medium) Hemovact drain(s) in the right lumbar with  Suction Open and Urinary Catheter (Foley)   LOCAL MEDICATIONS USED:  MARCAINE 0.5% 1:1 EXPAREL 1.3% Amount: 30 ml  SPECIMEN:  No Specimen  DISPOSITION OF SPECIMEN:  N/A  COUNTS:  YES  TOURNIQUET:  * No tourniquets in log *  DICTATION: .Dragon Dictation  PLAN OF CARE: Admit to inpatient   PATIENT DISPOSITION:  PACU - hemodynamically stable.   Delay start of Pharmacological VTE agent (>24hrs) due to surgical blood loss or risk of bleeding: yes

## 2014-08-22 NOTE — Anesthesia Preprocedure Evaluation (Signed)
Anesthesia Evaluation  Patient identified by MRN, date of birth, ID band Patient awake    Reviewed: Allergy & Precautions, H&P , NPO status , Patient's Chart, lab work & pertinent test results  History of Anesthesia Complications (+) PONV and history of anesthetic complications  Airway Mallampati: II  TM Distance: >3 FB Neck ROM: Full    Dental no notable dental hx. (+) Teeth Intact, Dental Advisory Given   Pulmonary neg pulmonary ROS,  breath sounds clear to auscultation  Pulmonary exam normal       Cardiovascular hypertension, On Medications +CHF Rhythm:Regular Rate:Normal  2002 and 2007 LHC with normal coronaries. NL EF on 2013 TTE. NICM related presumably to chemotherapy   Neuro/Psych  Headaches, negative neurological ROS  negative psych ROS   GI/Hepatic Neg liver ROS, hiatal hernia, GERD-  Medicated and Controlled,  Endo/Other  diabetes, Type 1, Insulin DependentHypothyroidism Morbid obesity  Renal/GU negative Renal ROS  negative genitourinary   Musculoskeletal   Abdominal   Peds  Hematology negative hematology ROS (+) anemia ,   Anesthesia Other Findings   Reproductive/Obstetrics negative OB ROS                             Anesthesia Physical  Anesthesia Plan  ASA: III  Anesthesia Plan: General   Post-op Pain Management:    Induction: Intravenous  Airway Management Planned: Oral ETT  Additional Equipment:   Intra-op Plan:   Post-operative Plan: Extubation in OR  Informed Consent: I have reviewed the patients History and Physical, chart, labs and discussed the procedure including the risks, benefits and alternatives for the proposed anesthesia with the patient or authorized representative who has indicated his/her understanding and acceptance.   Dental advisory given  Plan Discussed with: CRNA  Anesthesia Plan Comments:         Anesthesia Quick Evaluation

## 2014-08-22 NOTE — Interval H&P Note (Signed)
History and Physical Interval Note:  08/22/2014 7:26 AM  [name]  has presented today for surgery, with the diagnosis of Recurrent herniated nucleus pulposus and spondylolisthesis L2-3, Degenerative Disc Disease L1-2  The various methods of treatment have been discussed with the patient and family. After consideration of risks, benefits and other options for treatment, the patient has consented to  Procedure(s): Right L2-3 and right L1-2 transforaminal lumbar interbody fusion with pedicle screws, rods, sleeves, cages, local bone graft, Vivigen, cancellous chips (N/A) as a surgical intervention .  The patient's history has been reviewed, patient examined, no change in status, stable for surgery.  I have reviewed the patient's chart and labs.  Questions were answered to the patient's satisfaction.     NITKA,JAMES E

## 2014-08-22 NOTE — Discharge Instructions (Addendum)
° ° °  Call if there is increasing drainage, fever greater than 101.5, severe head aches, and worsening nausea or light sensitivity. If shortness of breath, bloody cough or chest tightness or pain go to an emergency room. No lifting greater than 10 lbs. Avoid bending, stooping and twisting. Use brace when sitting and out of bed even to go to bathroom. Walk in house for first 2 weeks then may start to get out slowly increasing distances up to one half mile by 4-6 weeks post op. After 5 days may shower and change dressing following bathing with shower.When bathing remove the brace shower and replace brace before getting out of the shower. If drainage, keep dry dressing and do not bathe the incision, use an moisture impervious dressing. Please call and return for scheduled follow up appointment 2 weeks from the time of surgery.

## 2014-08-23 LAB — BASIC METABOLIC PANEL
Anion gap: 9 (ref 5–15)
BUN: 14 mg/dL (ref 6–20)
CHLORIDE: 107 mmol/L (ref 101–111)
CO2: 22 mmol/L (ref 22–32)
Calcium: 8.7 mg/dL — ABNORMAL LOW (ref 8.9–10.3)
Creatinine, Ser: 0.73 mg/dL (ref 0.44–1.00)
GFR calc non Af Amer: >60 mL/min (ref >60)
Glucose, Bld: 163 mg/dL — ABNORMAL HIGH (ref 65–99)
Potassium: 3.7 mmol/L (ref 3.5–5.1)
Sodium: 138 mmol/L (ref 135–145)

## 2014-08-23 LAB — CBC
HEMATOCRIT: 32 % — AB (ref 36.0–46.0)
Hemoglobin: 10.3 g/dL — ABNORMAL LOW (ref 12.0–15.0)
MCH: 29.3 pg (ref 26.0–34.0)
MCHC: 32.2 g/dL (ref 30.0–36.0)
MCV: 91.2 fL (ref 78.0–100.0)
Platelets: 206 10*3/uL (ref 150–400)
RBC: 3.51 MIL/uL — ABNORMAL LOW (ref 3.87–5.11)
RDW: 14.8 % (ref 11.5–15.5)
WBC: 11.1 10*3/uL — ABNORMAL HIGH (ref 4.0–10.5)

## 2014-08-23 LAB — GLUCOSE, CAPILLARY
GLUCOSE-CAPILLARY: 148 mg/dL — AB (ref 65–99)
Glucose-Capillary: 141 mg/dL — ABNORMAL HIGH (ref 65–99)
Glucose-Capillary: 140 mg/dL — ABNORMAL HIGH (ref 65–99)
Glucose-Capillary: 138 mg/dL — ABNORMAL HIGH (ref 65–99)
Glucose-Capillary: 167 mg/dL — ABNORMAL HIGH (ref 65–99)

## 2014-08-23 LAB — HEMOGLOBIN A1C
Hgb A1c MFr Bld: 8.4 % — ABNORMAL HIGH (ref 4.8–5.6)
Mean Plasma Glucose: 194 mg/dL

## 2014-08-23 MED ORDER — SODIUM CHLORIDE 0.9 % IJ SOLN
10.0000 mL | INTRAMUSCULAR | Status: DC | PRN
  Administered 2014-08-23 – 2014-08-25 (×3): 10 mL
  Filled 2014-08-23 (×3): qty 40

## 2014-08-23 MED ORDER — SODIUM CHLORIDE 0.9 % IJ SOLN
10.0000 mL | Freq: Two times a day (BID) | INTRAMUSCULAR | Status: DC
Start: 2014-08-23 — End: 2014-08-25

## 2014-08-23 NOTE — Evaluation (Signed)
Physical Therapy Evaluation Patient Details Name: [name] MRN: [id number] DOB: 1950-10-25 Today's Date: 08/23/2014   History of Present Illness  Patient is a 64 yo married female with spinal stenosis associated with retrolisthesis at L2 on L3 and recurrent disk protrusion into the right lateral recess affecting the right L2-L3 nerve roots with concomitant synovial cyst off the right side, L2-3 facet, extending into the neural foramen causing compression. h/o microdiscectomy on the right side at the L2-3 level for a herniated disk above an L3 to L5 fusion 01/2014. Now s/p Rt L1-L2, L2-L3 fusion with screws.  Clinical Impression  Patient's pain incr into Rt hip during gait. Needs frequent reminders for back precautions during mobility and self-repositioning.  Therapy will continue to follow to assist with discharge planning and follow up recommendations.     Follow Up Recommendations Home health PT;Outpatient PT;Supervision/Assistance - 24 hour    Equipment Recommendations  None recommended by PT    Recommendations for Other Services OT consult     Precautions / Restrictions Precautions Precautions: Back Precaution Booklet Issued: Yes (comment) Precaution Comments: needs reminders for back precautions during mobility and self repositioning Required Braces or Orthoses: Spinal Brace Spinal Brace: Lumbar corset;Applied in sitting position Restrictions Weight Bearing Restrictions: No      Mobility  Bed Mobility Overal bed mobility: Needs Assistance Bed Mobility: Supine to Sit     Supine to sit: Supervision     General bed mobility comments: with left rail and raised HOB; cues for back precautions  Transfers Overall transfer level: Needs assistance Equipment used: Rolling walker (2 wheeled) Transfers: Sit to/from Stand Sit to Stand: Min guard         General transfer comment: min cues for hand placement for back precautions  Ambulation/Gait Ambulation/Gait  assistance: Min guard Ambulation Distance (Feet): 200 Feet Assistive device: Rolling walker (2 wheeled) Gait Pattern/deviations: Decreased step length - right;Decreased step length - left        Stairs            Wheelchair Mobility    Modified Rankin (Stroke Patients Only)       Balance Overall balance assessment: Needs assistance Sitting-balance support: No upper extremity supported;Feet supported Sitting balance-Leahy Scale: Fair     Standing balance support: Bilateral upper extremity supported Standing balance-Leahy Scale: Fair                               Pertinent Vitals/Pain Pain Assessment: 0-10 Pain Score: 6  Pain Location: back at incision site Pain Descriptors / Indicators: Aching;Sore Pain Intervention(s): Limited activity within patient's tolerance;Monitored during session;Patient requesting pain meds-RN notified (patient's pain incr during walking into Rt hip (tingling))    Home Living Family/patient expects to be discharged to:: Private residence Living Arrangements: Spouse/significant other Available Help at Discharge: Family;Available 24 hours/day Type of Home: House Home Access: Stairs to enter Entrance Stairs-Rails: Left Entrance Stairs-Number of Steps: 3 Home Layout: One level Home Equipment: Walker - 2 wheels;Bedside commode;Shower seat;Cane - single point;Adaptive equipment      Prior Function Level of Independence: Independent               Hand Dominance   Dominant Hand: Right    Extremity/Trunk Assessment   Upper Extremity Assessment: Overall WFL for tasks assessed           Lower Extremity Assessment: Overall WFL for tasks assessed      Cervical / Trunk  Assessment: Normal  Communication   Communication: No difficulties  Cognition Arousal/Alertness: Awake/alert Behavior During Therapy: WFL for tasks assessed/performed Overall Cognitive Status: Within Functional Limits for tasks assessed                       General Comments General comments (skin integrity, edema, etc.): drain at incision site, central line left side of neck    Exercises        Assessment/Plan    PT Assessment Patient needs continued PT services  PT Diagnosis Difficulty walking;Abnormality of gait;Acute pain   PT Problem List Decreased activity tolerance;Decreased balance;Decreased mobility;Decreased knowledge of use of DME;Decreased knowledge of precautions;Pain  PT Treatment Interventions DME instruction;Gait training;Stair training;Functional mobility training;Therapeutic activities;Therapeutic exercise;Balance training;Neuromuscular re-education;Patient/family education   PT Goals (Current goals can be found in the Care Plan section) Acute Rehab PT Goals Patient Stated Goal: return home first of the week PT Goal Formulation: With patient Time For Goal Achievement: 08/30/14 Potential to Achieve Goals: Good    Frequency Min 5X/week   Barriers to discharge        Co-evaluation               End of Session Equipment Utilized During Treatment: Gait belt;Back brace Activity Tolerance: Patient tolerated treatment well;Patient limited by pain Patient left: in chair;with call bell/phone within reach;with family/visitor present Nurse Communication: Mobility status;Patient requests pain meds         Time: 1141-1209 PT Time Calculation (min) (ACUTE ONLY): 28 min   Charges:   PT Evaluation $Initial PT Evaluation Tier I: 1 Procedure PT Treatments $Gait Training: 8-22 mins   PT G CodesMalka So, [place] [id number]  [place] 08/23/2014, 12:33 PM

## 2014-08-23 NOTE — Progress Notes (Signed)
Orthopedic Tech Progress Note Patient Details:  [name] 06-Mar-1951 [id number]  Patient ID: [name], female   DOB: 1950-09-18, 64 y.o.   MRN: [id number] Called in bio-tech brace order; spoke with Harl Favor, Ahlia Lemanski 08/23/2014, 9:28 AM

## 2014-08-23 NOTE — Progress Notes (Signed)
Subjective: 1 Day Post-Op Procedure(s) (LRB): Right L2-3 and right L1-2 transforaminal lumbar interbody fusion with pedicle screws, rods, sleeves, cages, local bone graft, Vivigen, cancellous chips (N/A) Patient reports pain as moderate.  Has been up mobilizing in her room.  Denies leg weakness.  Objective: Vital signs in last 24 hours: Temp:  [97.2 F (36.2 C)-98.7 F (37.1 C)] 98.7 F (37.1 C) (05/14 0501) Pulse Rate:  [59-113] 75 (05/14 0501) Resp:  [11-19] 18 (05/14 0501) BP: (112-175)/(37-72) 131/67 mmHg (05/14 0501) SpO2:  [94 %-100 %] 98 % (05/14 0501) Weight:  [96.7 kg (213 lb 3 oz)] 96.7 kg (213 lb 3 oz) (05/13 1715)  Intake/Output from previous day: 05/13 0701 - 05/14 0700 In: 4620 [P.O.:120; I.V.:3350; Blood:400; IV Piggyback:750] Out: [id number] [Urine:4250; Drains:298; Blood:1200] Intake/Output this shift: Total I/O In: 120 [P.O.:120] Out: -   No results for input(s): HGB in the last 72 hours. No results for input(s): WBC, RBC, HCT, PLT in the last 72 hours. No results for input(s): NA, K, CL, CO2, BUN, CREATININE, GLUCOSE, CALCIUM in the last 72 hours. No results for input(s): LABPT, INR in the last 72 hours.  Dorsiflexion/Plantar flexion intact Incision: dressing C/D/I She has grossly intact sensation and motor in her bilateral LE. She shows great effort   Assessment/Plan: 1 Day Post-Op Procedure(s) (LRB): Right L2-3 and right L1-2 transforaminal lumbar interbody fusion with pedicle screws, rods, sleeves, cages, local bone graft, Vivigen, cancellous chips (N/A) Up with therapy  Has back brace. Awaiting labs  Mcarthur Rossetti 08/23/2014, 9:02 AM

## 2014-08-23 NOTE — Care Management Note (Signed)
Case Management Note  Patient Details  Name: ALPHIA BEHANNA MRN: [id number] Date of Birth: 1951/03/01  Subjective/Objective:  64 yo F underwent R L2-3 and R L1-2 transforaminal lumbar interbody fusion with pedicle, screws, rods, sleeves, cages, local bone graft, Vivigen and cancellous chips.                  Action/Plan: referral received for DME (RW and 3 in 1)   Expected Discharge Date:  08/26/14               Expected Discharge Plan:  Home/Self Care  In-House Referral:     Discharge planning Services  CM Consult  Post Acute Care Choice:    Choice offered to:     DME Arranged:    DME Agency:     HH Arranged:    [place] Agency:     Status of Service:  Completed, signed off  Medicare Important Message Given:    Date Medicare IM Given:    Medicare IM give by:    Date Additional Medicare IM Given:    Additional Medicare Important Message give by:     If discussed at [place] of Stay Meetings, dates discussed:    Additional Comments: met with pt to discuss D/C plan and DME. Pt plans to return home with the support of her husband. She has a rolling walker, 3 in 1 BSC and canes. No D/C needs identified.  Norina Buzzard, RN 08/23/2014, 10:11 AM

## 2014-08-23 NOTE — Progress Notes (Signed)
Occupational Therapy Evaluation Patient Details Name: [name] MRN: [id number] DOB: Jul 14, 1950 Today's Date: 08/23/2014    History of Present Illness Patient is a 63 yo married female with spinal stenosis associated with retrolisthesis at L2 on L3 and recurrent disk protrusion into the right lateral recess affecting the right L2-L3 nerve roots with concomitant synovial cyst off the right side, L2-3 facet, extending into the neural foramen causing compression. h/o microdiscectomy on the right side at the L2-3 level for a herniated disk above an L3 to L5 fusion 01/2014. Now s/p Rt L1-L2, L2-L3 fusion with screws.   Clinical Impression   Patient independent PTA. Patient currently functioning at an overall supervision>steady assist level. Patient will benefit from acute OT to increase overall independence in the areas of ADLs, functional mobility, adherence to back precautions, and overall safety, in order to safely discharge home with assistance from husband and daughter. Patient can independently don/doff lumbar brace. Educated patient on lumbar brace wearing orders, patient verbalized understanding.     Follow Up Recommendations  No OT follow up;Supervision - Intermittent    Equipment Recommendations  None recommended by OT    Recommendations for Other Services  None at this time   Precautions / Restrictions Precautions Precautions: Back Precaution Booklet Issued: Yes (comment) Precaution Comments: reviewed 3/3 back precautions, patient able to verbalize 2/3 independently - needing cues for no arching Required Braces or Orthoses: Spinal Brace Spinal Brace: Lumbar corset;Applied in sitting position Restrictions Weight Bearing Restrictions: No      Mobility Bed Mobility Overal bed mobility: Needs Assistance Bed Mobility: Supine to Sit Supine to sit: Supervision   General bed mobility comments: Patient found seated in recliner upon entering room, please see PT note for more  information   Transfers Overall transfer level: Needs assistance Equipment used: Rolling walker (2 wheeled) Transfers: Sit to/from Stand Sit to Stand: Supervision General transfer comment: Cues for technique, hand placement, safety, and back precautions     Balance Overall balance assessment: Needs assistance Sitting-balance support: No upper extremity supported;Feet supported Sitting balance-Leahy Scale: Fair     Standing balance support: Bilateral upper extremity supported;During functional activity Standing balance-Leahy Scale: Fair    ADL Overall ADL's : Needs assistance/impaired   General ADL Comments: Patient unable to reach BLEs for LB ADLs. Patient required cueing to adhere to back precautions. Patient supervision for functional mobility, cues requried for safety and precautions. throughout session. Patient performed tub/shower transfer on/off tub transfer bench with close supervision. Patient states her mother has a tub transfer bench she can borror once d/c from acute. Patient ambulated <> therapy gym during this OT eval/treat.     Pertinent Vitals/Pain Pain Assessment: 0-10 Pain Score: 10-Worst pain ever Pain Location: back at incision site Pain Descriptors / Indicators: Aching Pain Intervention(s): Monitored during session;Patient requesting pain meds-RN notified;RN gave pain meds during session     Hand Dominance Right   Extremity/Trunk Assessment Upper Extremity Assessment Upper Extremity Assessment: Overall WFL for tasks assessed   Lower Extremity Assessment Lower Extremity Assessment: Overall WFL for tasks assessed   Cervical / Trunk Assessment Cervical / Trunk Assessment: Normal   Communication Communication Communication: No difficulties   Cognition Arousal/Alertness: Awake/alert Behavior During Therapy: WFL for tasks assessed/performed Overall Cognitive Status: Within Functional Limits for tasks assessed             Home Living Family/patient  expects to be discharged to:: Private residence Living Arrangements: Spouse/significant other Available Help at Discharge: Family;Available 24 hours/day Type of  Home: House Home Access: Stairs to enter [employer] of Steps: 3 Entrance Stairs-Rails: Left Home Layout: One level     Bathroom Shower/Tub: Tub/shower unit;Curtain   Biochemist, clinical: [place]: Environmental consultant - 2 wheels;Bedside commode;Shower seat;Cane - single point;Adaptive equipment Adaptive Equipment: Reacher;Sock aid   Prior Functioning/Environment Level of Independence: Independent     OT Diagnosis: Generalized weakness;Acute pain   OT Problem List: Decreased strength;Decreased range of motion;Decreased activity tolerance;Impaired balance (sitting and/or standing);Decreased safety awareness;Decreased knowledge of use of DME or AE;Decreased knowledge of precautions;Pain   OT Treatment/Interventions: Self-care/ADL training;Energy conservation;DME and/or AE instruction;Therapeutic activities;Patient/family education;Balance training    OT Goals(Current goals can be found in the care plan section) Acute Rehab OT Goals Patient Stated Goal: return home first of the week OT Goal Formulation: With patient Time For Goal Achievement: 08/30/14 Potential to Achieve Goals: Good ADL Goals Pt Will Perform Grooming: with modified independence;standing Pt Will Perform Lower Body Bathing: with supervision;with adaptive equipment;sit to/from stand Pt Will Perform Lower Body Dressing: with supervision;with adaptive equipment;sit to/from stand Pt Will Transfer to Toilet: with modified independence;ambulating;bedside commode Pt Will Perform Toileting - Clothing Manipulation and hygiene: with modified independence;sit to/from stand;with adaptive equipment Pt Will Perform Tub/Shower Transfer: Tub transfer;ambulating;tub bench;rolling walker;with modified independence  OT Frequency: Min 2X/week   Barriers to D/C:  None known at this time   End of Session Equipment Utilized During Treatment: Rolling walker;Back brace Nurse Communication: Patient requests pain meds  Activity Tolerance: Patient tolerated treatment well Patient left: in chair;with call bell/phone within reach;with family/visitor present   Time: 1351-1417 OT Time Calculation (min): 26 min Charges:  OT General Charges $OT Visit: 1 Procedure OT Evaluation $Initial OT Evaluation Tier I: 1 Procedure OT Treatments $Self Care/Home Management : 8-22 mins  Rollande Thursby , MS, OTR/L, CLT Pager: [phone]  08/23/2014, 2:35 PM

## 2014-08-24 LAB — HEMOGLOBIN AND HEMATOCRIT, BLOOD
HCT: 32.2 % — ABNORMAL LOW (ref 36.0–46.0)
Hemoglobin: 10.1 g/dL — ABNORMAL LOW (ref 12.0–15.0)

## 2014-08-24 LAB — POCT I-STAT 4, (NA,K, GLUC, HGB,HCT)
GLUCOSE: 174 mg/dL — AB (ref 65–99)
GLUCOSE: 189 mg/dL — AB (ref 65–99)
HCT: 33 % — ABNORMAL LOW (ref 36.0–46.0)
HCT: 35 % — ABNORMAL LOW (ref 36.0–46.0)
HEMOGLOBIN: 11.9 g/dL — AB (ref 12.0–15.0)
Hemoglobin: 11.2 g/dL — ABNORMAL LOW (ref 12.0–15.0)
POTASSIUM: 4.5 mmol/L (ref 3.5–5.1)
Potassium: 4.1 mmol/L (ref 3.5–5.1)
SODIUM: 142 mmol/L (ref 135–145)
Sodium: 142 mmol/L (ref 135–145)

## 2014-08-24 LAB — GLUCOSE, CAPILLARY
GLUCOSE-CAPILLARY: 123 mg/dL — AB (ref 65–99)
GLUCOSE-CAPILLARY: 93 mg/dL (ref 65–99)
Glucose-Capillary: 110 mg/dL — ABNORMAL HIGH (ref 65–99)
Glucose-Capillary: 142 mg/dL — ABNORMAL HIGH (ref 65–99)

## 2014-08-24 IMAGING — CR DG ABDOMEN 1V
1 series · 1 of 1 positions shown · non-contrast
Comparison: Intraoperative images 04/30/2012

CLINICAL DATA: Lower abdominal pain and recent lumbar spine fusion.

ABDOMEN - 1 VIEW

[x abdomen supine]
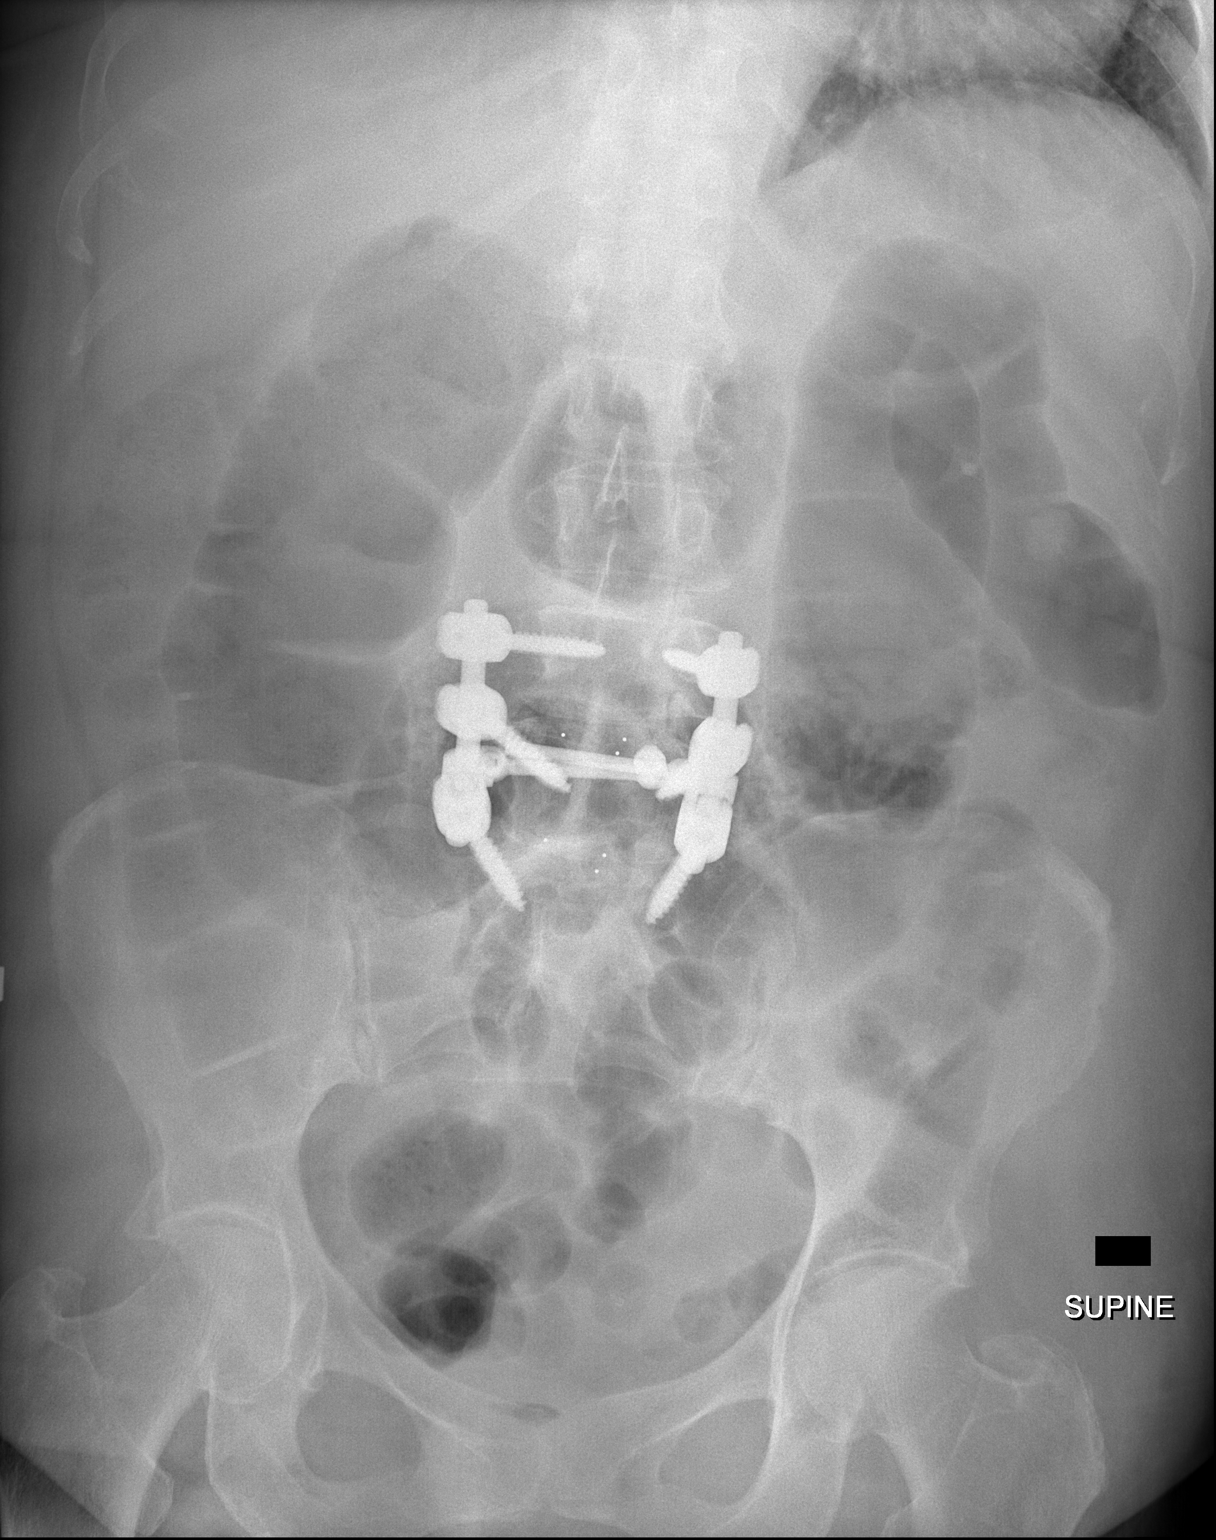

[1 of 1 positions shown; findings below may reference images not displayed]

FINDINGS: Single view of the abdomen demonstrates gas throughout
the colon.  There is also gas within the small bowel.  The patient
has pedicle screw fixation at L4-S1.
IMPRESSION: Gas-filled small and large bowel loops.  Findings could be
associated with a postoperative ileus.

## 2014-08-24 IMAGING — CR DG CHEST 1V
1 series · 1 of 1 positions shown · non-contrast
Comparison: Chest radiograph 03/19/2012

CLINICAL DATA: Fever and postop from spine surgery.

CHEST - 1 VIEW

[x chest ap]
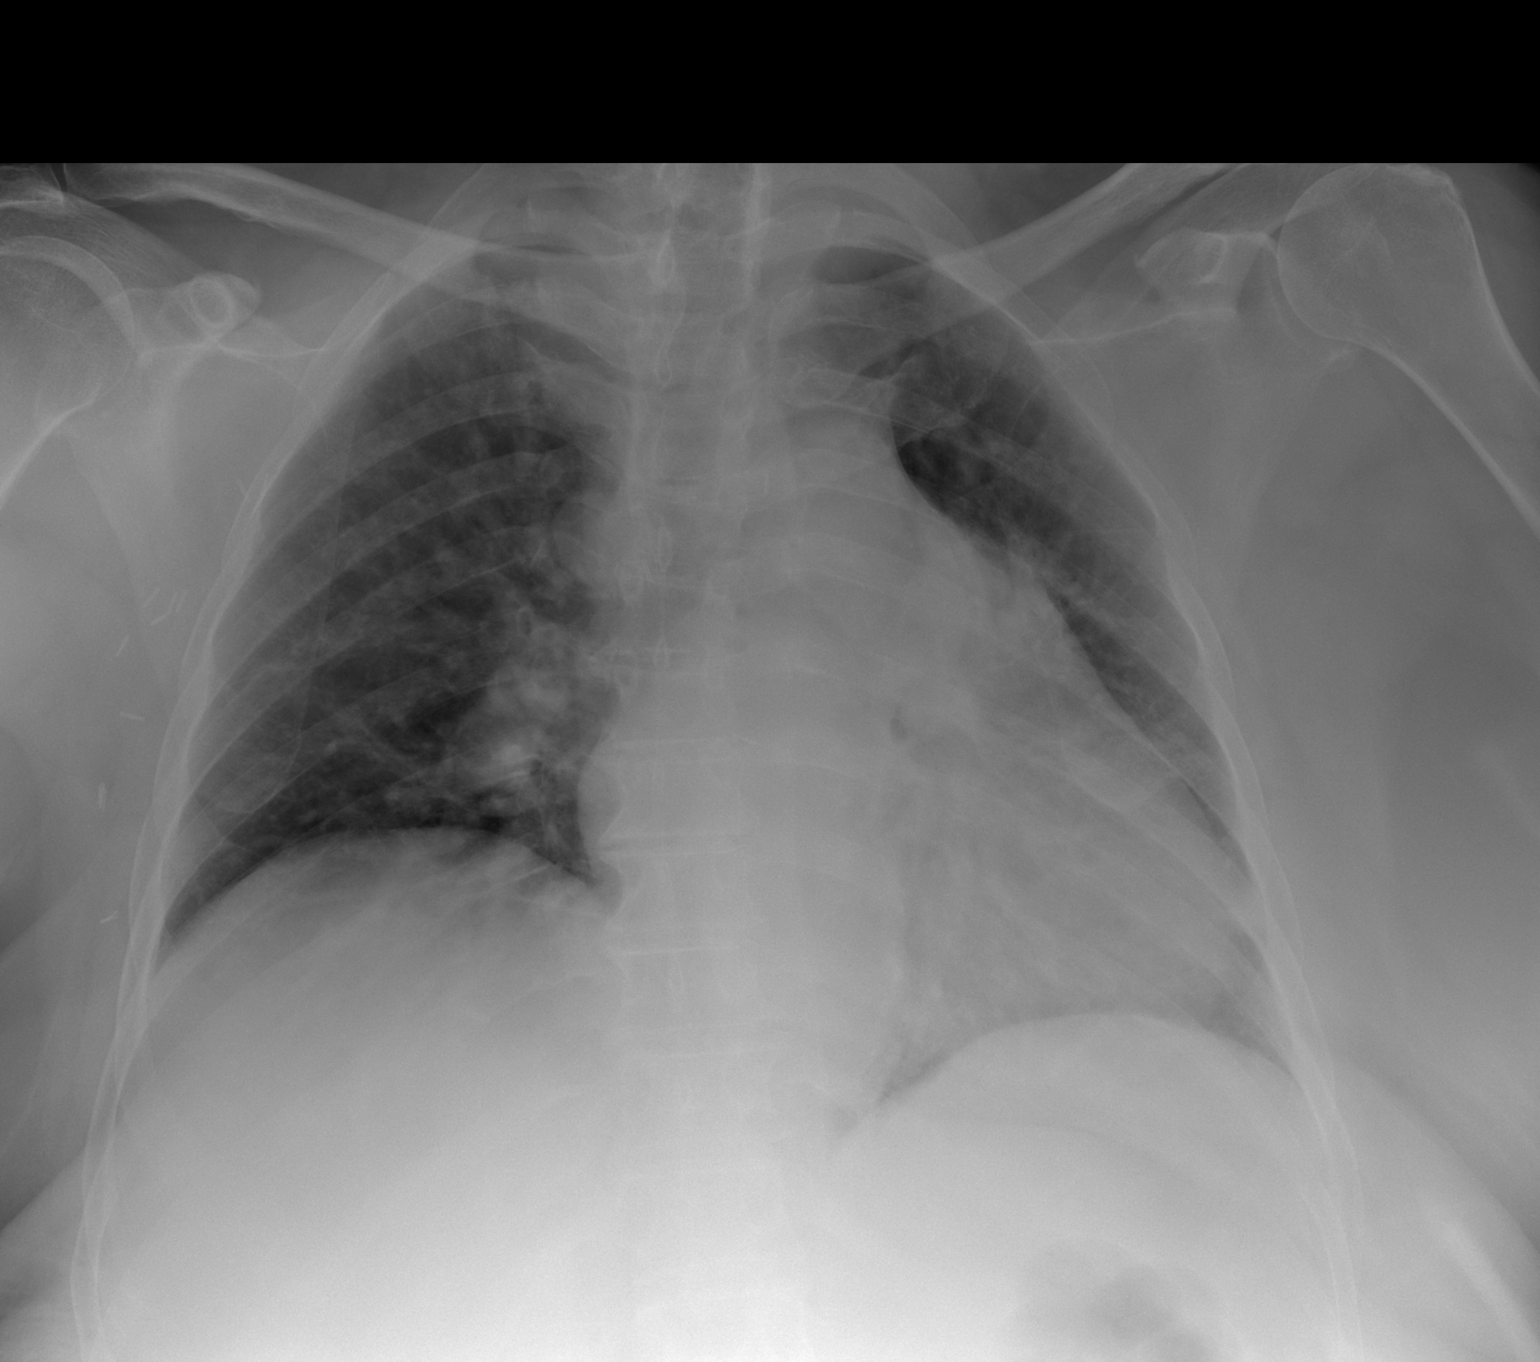

[1 of 1 positions shown; findings below may reference images not displayed]

FINDINGS: Single view of the chest was obtained.  The patient has
prominent azygos shadow.  Prominent central vascular structures
without frank pulmonary edema. Mild fullness in the left hilar
region.  Surgical plate in the lower cervical spine and stable
elevation of the right hemidiaphragm. Patient is mildly rotated on
the examination.
IMPRESSION: Prominent central vascular structures suggest vascular congestion.

## 2014-08-24 MED ORDER — PNEUMOCOCCAL VAC POLYVALENT 25 MCG/0.5ML IJ INJ
0.5000 mL | INJECTION | INTRAMUSCULAR | Status: AC
  Administered 2014-08-25: 0.5 mL via INTRAMUSCULAR
  Filled 2014-08-24: qty 0.5

## 2014-08-24 MED ORDER — LORATADINE 10 MG PO TABS
10.0000 mg | ORAL_TABLET | Freq: Every day | ORAL | Status: DC
  Administered 2014-08-24 – 2014-08-25 (×2): 10 mg via ORAL
  Filled 2014-08-24 (×2): qty 1

## 2014-08-24 NOTE — Progress Notes (Signed)
Physical Therapy Treatment Patient Details Name: [name] MRN: [id number] DOB: Jan 22, 1951 Today's Date: 08/24/2014    History of Present Illness Patient is a 64 yo married female with spinal stenosis associated with retrolisthesis at L2 on L3 and recurrent disk protrusion into the right lateral recess affecting the right L2-L3 nerve roots with concomitant synovial cyst off the right side, L2-3 facet, extending into the neural foramen causing compression. h/o microdiscectomy on the right side at the L2-3 level for a herniated disk above an L3 to L5 fusion 01/2014. Now s/p Rt L1-L2, L2-L3 fusion with screws.    PT Comments    Pt reports that she just feels more tired today. Progressing well with mobility.  Follow Up Recommendations  Home health PT;Supervision/Assistance - 24 hour     Equipment Recommendations  None recommended by PT    Recommendations for Other Services       Precautions / Restrictions Precautions Precautions: Back Precaution Comments: Reviewed 3/3 back precautions. Pt independently recalled 1/3. Required Braces or Orthoses: Spinal Brace Spinal Brace: Lumbar corset;Applied in sitting position    Mobility  Bed Mobility               General bed mobility comments: Pt sitting EOB upon arrival.  Transfers   Equipment used: Rolling walker (2 wheeled)   Sit to Stand: Supervision         General transfer comment: verbal cues for sequencing  Ambulation/Gait Ambulation/Gait assistance: Min guard Ambulation Distance (Feet): 200 Feet Assistive device: Rolling walker (2 wheeled) Gait Pattern/deviations: Decreased stride length;Step-through pattern   Gait velocity interpretation: Below normal speed for age/gender     Stairs            Wheelchair Mobility    Modified Rankin (Stroke Patients Only)       Balance                                    Cognition Arousal/Alertness: Awake/alert Behavior During Therapy: WFL  for tasks assessed/performed Overall Cognitive Status: Within Functional Limits for tasks assessed                      Exercises      General Comments        Pertinent Vitals/Pain Pain Assessment: 0-10 Pain Score: 6  Pain Location: back Pain Intervention(s): Monitored during session;Premedicated before session    Home Living                      Prior Function            PT Goals (current goals can now be found in the care plan section) Acute Rehab PT Goals Patient Stated Goal: return home first of the week PT Goal Formulation: With patient Time For Goal Achievement: 08/30/14 Potential to Achieve Goals: Good Progress towards PT goals: Progressing toward goals    Frequency  Min 5X/week    PT Plan Current plan remains appropriate    Co-evaluation             End of Session Equipment Utilized During Treatment: Gait belt;Back brace Activity Tolerance: Patient tolerated treatment well Patient left: in chair;with call bell/phone within reach     Time: 1041-1057 PT Time Calculation (min) (ACUTE ONLY): 16 min  Charges:  $Gait Training: 8-22 mins  G Codes:      [name] 08/24/2014, 11:00 AM

## 2014-08-24 NOTE — Progress Notes (Signed)
Subjective: 2 Days Post-Op Procedure(s) (LRB): Right L2-3 and right L1-2 transforaminal lumbar interbody fusion with pedicle screws, rods, sleeves, cages, local bone graft, Vivigen, cancellous chips (N/A) Patient reports pain as moderate.  Poor appetite.   Objective: Vital signs in last 24 hours: Temp:  [99 F (37.2 C)-99.1 F (37.3 C)] 99.1 F (37.3 C) (05/15 0550) Pulse Rate:  [74-115] 74 (05/15 0550) Resp:  [18] 18 (05/15 0550) BP: (93-120)/(42-58) 93/42 mmHg (05/15 0550) SpO2:  [92 %-100 %] 92 % (05/15 0550)  Intake/Output from previous day: 05/14 0701 - 05/15 0700 In: 840 [P.O.:840] Out: 600 [Urine:400; Drains:200] Intake/Output this shift: Total I/O In: 240 [P.O.:240] Out: -    Recent Labs  08/23/14 0840  HGB 10.3*    Recent Labs  08/23/14 0840  WBC 11.1*  RBC 3.51*  HCT 32.0*  PLT 206    Recent Labs  08/23/14 0840  NA 138  K 3.7  CL 107  CO2 22  BUN 14  CREATININE 0.73  GLUCOSE 163*  CALCIUM 8.7*   No results for input(s): LABPT, INR in the last 72 hours.  Sensation intact distally Dorsiflexion/Plantar flexion intact Incision: dressing C/D/I Compartment soft Drain in place  Assessment/Plan: 2 Days Post-Op Procedure(s) (LRB): Right L2-3 and right L1-2 transforaminal lumbar interbody fusion with pedicle screws, rods, sleeves, cages, local bone graft, Vivigen, cancellous chips (N/A) Up with therapy  Drain left in place  [place], [name] 08/24/2014, 9:22 AM

## 2014-08-24 NOTE — Progress Notes (Signed)
Occupational Therapy Treatment Patient Details Name: [name] MRN: [id number] DOB: [birth year] Today's Date: 08/24/2014    History of present illness Patient is a 64 yo married female with spinal stenosis associated with retrolisthesis at L2 on L3 and recurrent disk protrusion into the right lateral recess affecting the right L2-L3 nerve roots with concomitant synovial cyst off the right side, L2-3 facet, extending into the neural foramen causing compression. h/o microdiscectomy on the right side at the L2-3 level for a herniated disk above an L3 to L5 fusion 01/2014. Now s/p Rt L1-L2, L2-L3 fusion with screws.   OT comments  Pt is limited by muscle spasms/cramping and reports soft BP today. Pt was asymptomatic during session and able to ambulate to bathroom for toileting, however returned to supine for comfort. Pt will continue to benefit from acute OT per POC.    Follow Up Recommendations  No OT follow up;Supervision - Intermittent    Equipment Recommendations  None recommended by OT    Recommendations for Other Services      Precautions / Restrictions Precautions Precautions: Back Precaution Comments: Reviewed 3/3 back precautions. Pt independently recalled 2/3. Required Braces or Orthoses: Spinal Brace Spinal Brace: Lumbar corset;Applied in sitting position Restrictions Weight Bearing Restrictions: No       Mobility Bed Mobility Overal bed mobility: Needs Assistance Bed Mobility: Sit to Sidelying;Rolling Rolling: Supervision       Sit to sidelying: Min guard General bed mobility comments: Pt sitting EOB when OT returned. Assisted back to supine after session, then positioned on side with pillows bolstered for support. Pt required min guard to return to bed.   Transfers Overall transfer level: Needs assistance Equipment used: Rolling walker (2 wheeled) Transfers: Sit to/from Stand Sit to Stand: Min guard         General transfer comment: Min guard due to  muscle cramping/spasms and pain/discomfort.         ADL Overall ADL's : Needs assistance/impaired                 Upper Body Dressing : Set up;Sitting   Lower Body Dressing: Sit to/from stand;Moderate assistance   Toilet Transfer: Supervision/safety;Ambulation;RW;Comfort height toilet Toilet Transfer Details (indicate cue type and reason): pt limited by cramping Toileting- Clothing Manipulation and Hygiene: Supervision/safety;Sit to/from stand       Functional mobility during ADLs: Min guard;Rolling walker General ADL Comments: Pt limited by cramping in her sides today and back pain. She reports her BP has been low and she got lightheaded with PT earlier. Pt also reports feeling cold and tired today. Pt required min guard/Supervision for functional mobility and continues to need assist for LB ADLs. Pt ambulated to toilet then returned to bed and assisted in positioning pt for comfort.                 Cognition  Arousal/Alertness: Awake/Alert Behavior During Therapy: WFL for tasks assessed/performed Overall Cognitive Status: Within Functional Limits for tasks assessed                                    Pertinent Vitals/ Pain       Pain Assessment: 0-10 Pain Score: 6  Pain Location: sides; back Pain Descriptors / Indicators: Cramping;Aching Pain Intervention(s): Limited activity within patient's tolerance;Monitored during session;Repositioned         Frequency Min 2X/week     Progress Toward Goals  OT Goals(current  goals can now be found in the care plan section)  Progress towards OT goals: Progressing toward goals (slowly)     Plan Discharge plan remains appropriate       End of Session Equipment Utilized During Treatment: Rolling walker;Back brace   Activity Tolerance Patient limited by fatigue;Patient limited by pain   Patient Left in bed;with call bell/phone within reach;with bed alarm set;with family/visitor present;with nursing/sitter  in room   Nurse Communication Other (comment) (pt requesting muscle relaxers)        Time: [id number] OT Time Calculation (min): 24 min  Charges: OT General Charges $OT Visit: 1 Procedure OT Treatments $Self Care/Home Management : 23-37 mins  Juluis Rainier 08/24/2014, 3:37 PM  Cyndie Chime, OTR/L Occupational Therapist [phone] (pager)

## 2014-08-25 ENCOUNTER — Other Ambulatory Visit: Payer: Self-pay | Admitting: Family Medicine

## 2014-08-25 ENCOUNTER — Telehealth: Payer: Self-pay | Admitting: *Deleted

## 2014-08-25 DIAGNOSIS — M4316 Spondylolisthesis, lumbar region: Secondary | ICD-10-CM | POA: Diagnosis not present

## 2014-08-25 DIAGNOSIS — E119 Type 2 diabetes mellitus without complications: Secondary | ICD-10-CM | POA: Diagnosis not present

## 2014-08-25 DIAGNOSIS — I429 Cardiomyopathy, unspecified: Secondary | ICD-10-CM | POA: Diagnosis not present

## 2014-08-25 DIAGNOSIS — E039 Hypothyroidism, unspecified: Secondary | ICD-10-CM | POA: Diagnosis not present

## 2014-08-25 DIAGNOSIS — G629 Polyneuropathy, unspecified: Secondary | ICD-10-CM | POA: Diagnosis not present

## 2014-08-25 DIAGNOSIS — K219 Gastro-esophageal reflux disease without esophagitis: Secondary | ICD-10-CM | POA: Diagnosis not present

## 2014-08-25 DIAGNOSIS — M5126 Other intervertebral disc displacement, lumbar region: Secondary | ICD-10-CM | POA: Diagnosis not present

## 2014-08-25 DIAGNOSIS — M5136 Other intervertebral disc degeneration, lumbar region: Secondary | ICD-10-CM | POA: Diagnosis not present

## 2014-08-25 DIAGNOSIS — M1990 Unspecified osteoarthritis, unspecified site: Secondary | ICD-10-CM | POA: Diagnosis not present

## 2014-08-25 MED ORDER — OXYCODONE-ACETAMINOPHEN 5-325 MG PO TABS: 1.0000 | ORAL_TABLET | ORAL | Status: DC | PRN

## 2014-08-25 MED ORDER — METFORMIN HCL 850 MG PO TABS: 850.0000 mg | ORAL_TABLET | Freq: Two times a day (BID) | ORAL | Status: DC

## 2014-08-25 MED ORDER — TIZANIDINE HCL 2 MG PO TABS: 2.0000 mg | ORAL_TABLET | Freq: Four times a day (QID) | ORAL | Status: DC | PRN

## 2014-08-25 MED ORDER — FERROUS GLUCONATE 324 (38 FE) MG PO TABS
324.0000 mg | ORAL_TABLET | Freq: Two times a day (BID) | ORAL | Status: DC
  Administered 2014-08-25: 324 mg via ORAL
  Filled 2014-08-25 (×3): qty 1

## 2014-08-25 MED ORDER — PROMETHAZINE HCL 25 MG PO TABS: 25.0000 mg | ORAL_TABLET | Freq: Three times a day (TID) | ORAL | Status: DC | PRN

## 2014-08-25 MED ORDER — DOCUSATE SODIUM 100 MG PO CAPS: 100.0000 mg | ORAL_CAPSULE | Freq: Two times a day (BID) | ORAL | Status: DC

## 2014-08-25 MED FILL — Thrombin For Soln 20000 Unit: CUTANEOUS | Qty: 1 | Status: AC

## 2014-08-25 NOTE — Progress Notes (Signed)
Occupational Therapy Treatment Patient Details Name: [name] MRN: [id number] DOB: 1950-09-02 Today's Date: 08/25/2014    History of present illness Patient is a 64 yo female with spinal stenosis associated with retrolisthesis at L2 on L3 and recurrent disk protrusion into the right lateral recess affecting the right L2-L3 nerve roots with concomitant synovial cyst off the right side, L2-3 facet, extending into the neural foramen causing compression. h/o microdiscectomy on the right side at the L2-3 level for a herniated disk above an L3 to L5 fusion 01/2014. Now s/p Rt L1-L2, L2-L3 fusion with screws.   OT comments  Completed education regarding ADL with use of compensatory techniques, AE and DME. Pt able to demonstrate understanding. Husband present for session. Pt ready to D/C home with husband when medically stable.   Follow Up Recommendations  No OT follow up;Supervision - Intermittent    Equipment Recommendations  None recommended by OT    Recommendations for Other Services      Precautions / Restrictions Precautions Precautions: Back Precaution Comments: Pt able to state 3/3 precautions but requires moc vc to followq Required Braces or Orthoses: Spinal Brace Spinal Brace: Lumbar corset;Thoracolumbosacral orthotic       Mobility Bed Mobility Pt verbalized understanding of log roll technique                Transfers Overall transfer level: Needs assistance Equipment used: Rolling walker (2 wheeled) Transfers: Sit to/from [employer] Sit to Stand: Supervision (vc to follow precautions) Stand pivot transfers: Modified independent (Device/Increase time)       General transfer comment: S    Balance Overall balance assessment: Needs assistance   Sitting balance-Leahy Scale: Good     Standing balance support: During functional activity;Single extremity supported Standing balance-Leahy Scale: Fair                     ADL  Overall ADL's : Needs assistance/impaired                             Toileting- Water quality scientist and Hygiene: Supervision/safety Toileting - Clothing Manipulation Details (indicate cue type and reason): Educated pt on use of toilet aid /wipes for [employer]     Functional mobility during ADLs: Supervision/safety;Rolling walker;Cueing for safety General ADL Comments: Completed education regarding compensatory techniques and use of AE for LB ADL with min vc. Husband present for session and able to verbalize and demonstrate understanding. husband able to assist pt as needed while following precautions. Husband also able to assist pt in properly donning back brace.   Discussed home safety and reducing risk of falls. Pt/husband verbalized understanding.                                       Cognition   Behavior During Therapy: WFL for tasks assessed/performed Overall Cognitive Status: Within Functional Limits for tasks assessed                                                 General Comments      Pertinent Vitals/ Pain       Pain Assessment: Faces Pain Score: 3  Faces Pain Scale: Hurts little more Pain Location: back Pain  Descriptors / Indicators: Aching;Burning Pain Intervention(s): Limited activity within patient's tolerance;Monitored during session;Repositioned  Home Living                                          Prior Functioning/Environment              Frequency Min 2X/week     Progress Toward Goals  OT Goals(current goals can now be found in the care plan section)  Progress towards OT goals: Goals met/education completed, patient discharged from OT  Acute Rehab OT Goals Patient Stated Goal: go home OT Goal Formulation: With patient/family Time For Goal Achievement: 08/30/14 Potential to Achieve Goals: Good ADL Goals Pt Will Perform Grooming: with modified independence;standing Pt Will  Perform Lower Body Bathing: with supervision;with adaptive equipment;sit to/from stand Pt Will Perform Lower Body Dressing: with supervision;with adaptive equipment;sit to/from stand Pt Will Transfer to Toilet: with modified independence;ambulating;bedside commode Pt Will Perform Toileting - Clothing Manipulation and hygiene: with modified independence;sit to/from stand;with adaptive equipment Pt Will Perform Tub/Shower Transfer: Tub transfer;ambulating;tub bench;rolling walker;with modified independence  Plan Discharge plan remains appropriate  Goals met/appropriate for D/C   Co-evaluation                 End of Session Equipment Utilized During Treatment: Rolling walker;Back brace   Activity Tolerance Patient tolerated treatment well   Patient Left in chair;with call bell/phone within reach;with family/visitor present   Nurse Communication Mobility status        Time: 1540-1606 OT Time Calculation (min): 26 min  Charges: OT General Charges $OT Visit: 1 Procedure OT Treatments $Self Care/Home Management : 23-37 mins  Isahia Hollerbach,HILLARY 08/25/2014, 4:27 PM  [facility], OTR/L  [phone] 08/25/2014

## 2014-08-25 NOTE — Progress Notes (Signed)
Dr. Louanne Skye called and requested that hemovac be removed. One suture removed. . Easily removed hemovac tubing. Area cleaned and mepilex dressing applied.

## 2014-08-25 NOTE — Care Management Note (Signed)
Case Management Note  Patient Details  Name: [name] MRN: [id number] Date of Birth: Nov 25, 1950  Subjective/Objective:     Patient s/p L1-2, 2-3 fusion               Action/Plan:  Case manager spoke with patient concerning home health needs. Choice offered. Referral called to [place], [facility] Liaison. Patient states she has a rolling walker and 3in1. Has family support at discharge.     Expected Discharge Date:  08/26/14               Expected Discharge Plan:  [place]  In-House Referral:     Discharge planning Services  CM Consult  Post Acute Care Choice:    Choice offered to:  NA  DME Arranged: NA   DME Agency:     HH Arranged:  PT [place]:  [place]  Status of Service: Completed     Medicare Important Message Given:  N/A - LOS <3 / Initial given by admissions Date Medicare IM Given:    Medicare IM give by:    Date Additional Medicare IM Given:    Additional Medicare Important Message give by:     If discussed at [place] of Stay Meetings, dates discussed:    Additional Comments:  Ninfa Meeker, RN 08/25/2014, 4:27 PM

## 2014-08-25 NOTE — Progress Notes (Signed)
L CVC D/C per MD order/cath intact/vaseline pressure gauze applied/pressure x5 min/no bleeding noted.

## 2014-08-25 NOTE — Progress Notes (Addendum)
Patient ID: [name], female   DOB: [birth year], 64 y.o.   MRN: [id number]     Subjective: 3 Days Post-Op Procedure(s) (LRB): Right L2-3 and right L1-2 transforaminal lumbar interbody fusion with pedicle screws, rods, sleeves, cages, local bone graft, Vivigen, cancellous chips (N/A) Awake,alert and oriented x4. Pain is mild. Patient reports pain as mild.    Objective:   VITALS:  Temp:  [99.3 F (37.4 C)-101 F (38.3 C)] 99.3 F (37.4 C) (05/16 0544) Pulse Rate:  [67-86] 82 (05/16 0544) Resp:  [16-18] 16 (05/16 0544) BP: (96-119)/(41-49) 107/44 mmHg (05/16 0544) SpO2:  [93 %-100 %] 93 % (05/16 0544)  Neurologically intact ABD soft Neurovascular intact Sensation intact distally Intact pulses distally Dorsiflexion/Plantar flexion intact Incision: no drainage   LABS  Recent Labs  08/22/14 1056 08/22/14 1247 08/23/14 0840 08/24/14 1334  HGB 11.2* 11.9* 10.3* 10.1*  WBC  --   --  11.1*  --   PLT  --   --  206  --     Recent Labs  08/22/14 1247 08/23/14 0840  NA 142 138  K 4.5 3.7  CL  --  107  CO2  --  22  BUN  --  14  CREATININE  --  0.73  GLUCOSE 189* 163*   No results for input(s): LABPT, INR in the last 72 hours.   Assessment/Plan: 3 Days Post-Op Procedure(s) (LRB): Right L2-3 and right L1-2 transforaminal lumbar interbody fusion with pedicle screws, rods, sleeves, cages, local bone graft, Vivigen, cancellous chips (N/A)  Advance diet Up with therapy Discharge this afternoon if she continues to make good progress with PT/OT Will ask brace shop to extend brace to Thoracic level adding a anterior chest sternal pad. Will check in PM.  NITKA,JAMES E 08/25/2014, 7:44 AM

## 2014-08-25 NOTE — Telephone Encounter (Signed)
Received a fax on 08/22/14 stating that above patient has been admitted to acute care hospital  Hospital: [facility] Date:08/22/14  Dx: M51.16- intervertabral disc disorders w radioculopathy,lumbar region  Admitting physician: Basil Dess, MD  Pending authorization:[id number]

## 2014-08-25 NOTE — Progress Notes (Signed)
Physical Therapy Treatment Patient Details Name: [name] MRN: [id number] DOB: 1950-07-15 Today's Date: 08/25/2014    History of Present Illness Patient is a 64 yo married female with spinal stenosis associated with retrolisthesis at L2 on L3 and recurrent disk protrusion into the right lateral recess affecting the right L2-L3 nerve roots with concomitant synovial cyst off the right side, L2-3 facet, extending into the neural foramen causing compression. h/o microdiscectomy on the right side at the L2-3 level for a herniated disk above an L3 to L5 fusion 01/2014. Now s/p Rt L1-L2, L2-L3 fusion with screws.    PT Comments    Patient progressing well this session and able to complete stair training this afternoon. Patient awaiting [employer] to adjust brace prior to DC home. Patient safe to D/C from a mobility standpoint based on progression towards goals set on PT eval.    Follow Up Recommendations  Home health PT;Supervision/Assistance - 24 hour     Equipment Recommendations  None recommended by PT    Recommendations for Other Services       Precautions / Restrictions Precautions Precautions: Back Precaution Comments: Reviewed 3/3 back precautions. Pt independently recalled 3/3. Required Braces or Orthoses: Spinal Brace Spinal Brace: Lumbar corset;Applied in sitting position    Mobility  Bed Mobility Overal bed mobility: Modified Independent                Transfers Overall transfer level: Modified independent                  Ambulation/Gait Ambulation/Gait assistance: Supervision Ambulation Distance (Feet): 300 Feet   Gait Pattern/deviations: Step-through pattern;Decreased stride length     General Gait Details: Slight off balance but no LOB as patient can tell and readjust as needed   Stairs Stairs: Yes Stairs assistance: Supervision Stair Management: One rail Left;One rail Right;Step to pattern;Forwards Number of Stairs: 5    Wheelchair  Mobility    Modified Rankin (Stroke Patients Only)       Balance                                    Cognition Arousal/Alertness: Awake/alert Behavior During Therapy: WFL for tasks assessed/performed Overall Cognitive Status: Within Functional Limits for tasks assessed                      Exercises      General Comments        Pertinent Vitals/Pain Pain Score: 3  Pain Descriptors / Indicators: Sore Pain Intervention(s): Limited activity within patient's tolerance    Home Living                      Prior Function            PT Goals (current goals can now be found in the care plan section) Progress towards PT goals: Progressing toward goals    Frequency  Min 5X/week    PT Plan Current plan remains appropriate    Co-evaluation             End of Session Equipment Utilized During Treatment: Gait belt;Back brace Activity Tolerance: Patient tolerated treatment well Patient left: in chair;with call bell/phone within reach     Time: 1429-1446 PT Time Calculation (min) (ACUTE ONLY): 17 min  Charges:  $Gait Training: 8-22 mins  G Codes:      Jacqualyn Posey 08/25/2014, 3:20 PM  08/25/2014 Jacqualyn Posey PTA [phone] pager [phone] office

## 2014-08-26 ENCOUNTER — Telehealth: Payer: Self-pay | Admitting: *Deleted

## 2014-08-26 DIAGNOSIS — K589 Irritable bowel syndrome without diarrhea: Secondary | ICD-10-CM | POA: Diagnosis not present

## 2014-08-26 DIAGNOSIS — Z96641 Presence of right artificial hip joint: Secondary | ICD-10-CM | POA: Diagnosis not present

## 2014-08-26 DIAGNOSIS — Z4789 Encounter for other orthopedic aftercare: Secondary | ICD-10-CM | POA: Diagnosis not present

## 2014-08-26 DIAGNOSIS — E1142 Type 2 diabetes mellitus with diabetic polyneuropathy: Secondary | ICD-10-CM | POA: Diagnosis not present

## 2014-08-26 DIAGNOSIS — Z853 Personal history of malignant neoplasm of breast: Secondary | ICD-10-CM | POA: Diagnosis not present

## 2014-08-26 DIAGNOSIS — I10 Essential (primary) hypertension: Secondary | ICD-10-CM | POA: Diagnosis not present

## 2014-08-26 DIAGNOSIS — Z794 Long term (current) use of insulin: Secondary | ICD-10-CM | POA: Diagnosis not present

## 2014-08-26 DIAGNOSIS — K219 Gastro-esophageal reflux disease without esophagitis: Secondary | ICD-10-CM | POA: Diagnosis not present

## 2014-08-26 NOTE — Telephone Encounter (Signed)
Received fax from [facility]charge [facility]post[facility] on May 17   Date of admit:08/22/14  Date of discharge:08/25/14  Level of Care: Inpatient Acute Medical Care  Discharging facility:[facility]  Attending physician:Nitka,James MD  [id number], Cammie Mcgee MD  Discharge Dispostion:Discharged/Transferred to home under care of organized home health services  DX:M51.16- Intervertebral disc disorder w radioculpathy,lumbar region

## 2014-08-27 ENCOUNTER — Encounter (HOSPITAL_COMMUNITY): Payer: Self-pay | Admitting: Specialist

## 2014-08-27 LAB — GLUCOSE, CAPILLARY
GLUCOSE-CAPILLARY: 151 mg/dL — AB (ref 65–99)
GLUCOSE-CAPILLARY: 101 mg/dL — AB (ref 65–99)

## 2014-08-28 DIAGNOSIS — I10 Essential (primary) hypertension: Secondary | ICD-10-CM | POA: Diagnosis not present

## 2014-08-28 DIAGNOSIS — K589 Irritable bowel syndrome without diarrhea: Secondary | ICD-10-CM | POA: Diagnosis not present

## 2014-08-28 DIAGNOSIS — K219 Gastro-esophageal reflux disease without esophagitis: Secondary | ICD-10-CM | POA: Diagnosis not present

## 2014-08-28 DIAGNOSIS — Z794 Long term (current) use of insulin: Secondary | ICD-10-CM | POA: Diagnosis not present

## 2014-08-28 DIAGNOSIS — E1142 Type 2 diabetes mellitus with diabetic polyneuropathy: Secondary | ICD-10-CM | POA: Diagnosis not present

## 2014-08-28 DIAGNOSIS — Z96641 Presence of right artificial hip joint: Secondary | ICD-10-CM | POA: Diagnosis not present

## 2014-08-28 DIAGNOSIS — Z4789 Encounter for other orthopedic aftercare: Secondary | ICD-10-CM | POA: Diagnosis not present

## 2014-08-28 DIAGNOSIS — Z853 Personal history of malignant neoplasm of breast: Secondary | ICD-10-CM | POA: Diagnosis not present

## 2014-08-30 DIAGNOSIS — I10 Essential (primary) hypertension: Secondary | ICD-10-CM | POA: Diagnosis not present

## 2014-08-30 DIAGNOSIS — Z853 Personal history of malignant neoplasm of breast: Secondary | ICD-10-CM | POA: Diagnosis not present

## 2014-08-30 DIAGNOSIS — K589 Irritable bowel syndrome without diarrhea: Secondary | ICD-10-CM | POA: Diagnosis not present

## 2014-08-30 DIAGNOSIS — E1142 Type 2 diabetes mellitus with diabetic polyneuropathy: Secondary | ICD-10-CM | POA: Diagnosis not present

## 2014-08-30 DIAGNOSIS — K219 Gastro-esophageal reflux disease without esophagitis: Secondary | ICD-10-CM | POA: Diagnosis not present

## 2014-08-30 DIAGNOSIS — Z794 Long term (current) use of insulin: Secondary | ICD-10-CM | POA: Diagnosis not present

## 2014-08-30 DIAGNOSIS — Z96641 Presence of right artificial hip joint: Secondary | ICD-10-CM | POA: Diagnosis not present

## 2014-08-30 DIAGNOSIS — Z4789 Encounter for other orthopedic aftercare: Secondary | ICD-10-CM | POA: Diagnosis not present

## 2014-09-01 DIAGNOSIS — Z96641 Presence of right artificial hip joint: Secondary | ICD-10-CM | POA: Diagnosis not present

## 2014-09-01 DIAGNOSIS — Z794 Long term (current) use of insulin: Secondary | ICD-10-CM | POA: Diagnosis not present

## 2014-09-01 DIAGNOSIS — Z4789 Encounter for other orthopedic aftercare: Secondary | ICD-10-CM | POA: Diagnosis not present

## 2014-09-01 DIAGNOSIS — Z853 Personal history of malignant neoplasm of breast: Secondary | ICD-10-CM | POA: Diagnosis not present

## 2014-09-01 DIAGNOSIS — I10 Essential (primary) hypertension: Secondary | ICD-10-CM | POA: Diagnosis not present

## 2014-09-01 DIAGNOSIS — K219 Gastro-esophageal reflux disease without esophagitis: Secondary | ICD-10-CM | POA: Diagnosis not present

## 2014-09-01 DIAGNOSIS — K589 Irritable bowel syndrome without diarrhea: Secondary | ICD-10-CM | POA: Diagnosis not present

## 2014-09-01 DIAGNOSIS — E1142 Type 2 diabetes mellitus with diabetic polyneuropathy: Secondary | ICD-10-CM | POA: Diagnosis not present

## 2014-09-03 DIAGNOSIS — E1142 Type 2 diabetes mellitus with diabetic polyneuropathy: Secondary | ICD-10-CM | POA: Diagnosis not present

## 2014-09-03 DIAGNOSIS — Z4789 Encounter for other orthopedic aftercare: Secondary | ICD-10-CM | POA: Diagnosis not present

## 2014-09-03 DIAGNOSIS — Z853 Personal history of malignant neoplasm of breast: Secondary | ICD-10-CM | POA: Diagnosis not present

## 2014-09-03 DIAGNOSIS — Z96641 Presence of right artificial hip joint: Secondary | ICD-10-CM | POA: Diagnosis not present

## 2014-09-03 DIAGNOSIS — Z794 Long term (current) use of insulin: Secondary | ICD-10-CM | POA: Diagnosis not present

## 2014-09-03 DIAGNOSIS — I10 Essential (primary) hypertension: Secondary | ICD-10-CM | POA: Diagnosis not present

## 2014-09-03 DIAGNOSIS — K219 Gastro-esophageal reflux disease without esophagitis: Secondary | ICD-10-CM | POA: Diagnosis not present

## 2014-09-03 DIAGNOSIS — K589 Irritable bowel syndrome without diarrhea: Secondary | ICD-10-CM | POA: Diagnosis not present

## 2014-09-05 DIAGNOSIS — Z96641 Presence of right artificial hip joint: Secondary | ICD-10-CM | POA: Diagnosis not present

## 2014-09-05 DIAGNOSIS — K219 Gastro-esophageal reflux disease without esophagitis: Secondary | ICD-10-CM | POA: Diagnosis not present

## 2014-09-05 DIAGNOSIS — K589 Irritable bowel syndrome without diarrhea: Secondary | ICD-10-CM | POA: Diagnosis not present

## 2014-09-05 DIAGNOSIS — Z794 Long term (current) use of insulin: Secondary | ICD-10-CM | POA: Diagnosis not present

## 2014-09-05 DIAGNOSIS — E1142 Type 2 diabetes mellitus with diabetic polyneuropathy: Secondary | ICD-10-CM | POA: Diagnosis not present

## 2014-09-05 DIAGNOSIS — I10 Essential (primary) hypertension: Secondary | ICD-10-CM | POA: Diagnosis not present

## 2014-09-05 DIAGNOSIS — Z4789 Encounter for other orthopedic aftercare: Secondary | ICD-10-CM | POA: Diagnosis not present

## 2014-09-05 DIAGNOSIS — Z853 Personal history of malignant neoplasm of breast: Secondary | ICD-10-CM | POA: Diagnosis not present

## 2014-09-09 ENCOUNTER — Telehealth: Payer: Self-pay | Admitting: *Deleted

## 2014-09-09 DIAGNOSIS — K589 Irritable bowel syndrome without diarrhea: Secondary | ICD-10-CM | POA: Diagnosis not present

## 2014-09-09 DIAGNOSIS — I10 Essential (primary) hypertension: Secondary | ICD-10-CM | POA: Diagnosis not present

## 2014-09-09 DIAGNOSIS — Z794 Long term (current) use of insulin: Secondary | ICD-10-CM | POA: Diagnosis not present

## 2014-09-09 DIAGNOSIS — Z853 Personal history of malignant neoplasm of breast: Secondary | ICD-10-CM | POA: Diagnosis not present

## 2014-09-09 DIAGNOSIS — Z96641 Presence of right artificial hip joint: Secondary | ICD-10-CM | POA: Diagnosis not present

## 2014-09-09 DIAGNOSIS — E1142 Type 2 diabetes mellitus with diabetic polyneuropathy: Secondary | ICD-10-CM | POA: Diagnosis not present

## 2014-09-09 DIAGNOSIS — Z4789 Encounter for other orthopedic aftercare: Secondary | ICD-10-CM | POA: Diagnosis not present

## 2014-09-09 DIAGNOSIS — K219 Gastro-esophageal reflux disease without esophagitis: Secondary | ICD-10-CM | POA: Diagnosis not present

## 2014-09-09 NOTE — Telephone Encounter (Signed)
Cut BP med in half and recheck here in 1 week.

## 2014-09-09 NOTE — Discharge Summary (Signed)
Patient ID: [name] MRN: [id number] DOB/AGE: [birth year] 64 y.o.  Admit date: 08/22/2014 Discharge date: 09/09/2014  Admission Diagnoses:  Principal Problem:   HNP (herniated nucleus pulposus), lumbar Active Problems:   Spondylolisthesis of lumbar region   Discharge Diagnoses:  Principal Problem:   HNP (herniated nucleus pulposus), lumbar Active Problems:   Spondylolisthesis of lumbar region  status post Procedure(s): Right L2-3 and right L1-2 transforaminal lumbar interbody fusion with pedicle screws, rods, sleeves, cages, local bone graft, Vivigen, cancellous chips  Past Medical History  Diagnosis Date  . GERD (gastroesophageal reflux disease)   . Hypothyroidism   . Peripheral neuropathy   . Cardiomyopathy   . Chest pain 06/2005    Hospitalized, dystolic dysfunction  . Sciatica   . Type II diabetes mellitus   . PONV (postoperative nausea and vomiting)   . H/O hiatal hernia   . Migraines     "it's been a long time since I've had one" (03/20/2012)  . Osteoarthritis   . Arthritis     "all over; mostly in my back" (03/20/2012)  . Chronic lower back pain     "have 2 herniated discs; going to have to have a fusion" (03/20/2012)  . Breast cancer     "right" (03/20/2012)  . Family history of anesthesia complication     "father has nausea and vomiting"  . Irritable bowel syndrome   . Hypertension     "patient states never had HTN, takes Hyzaar for heart  . History of right mastectomy   . Anginal pain 03/19/2012    saw Dr. Cathie Olden .. she thinks its more related to stomach issues  . History of kidney stones     Surgeries: Procedure(s): Right L2-3 and right L1-2 transforaminal lumbar interbody fusion with pedicle screws, rods, sleeves, cages, local bone graft, Vivigen, cancellous chips on 08/22/2014   Consultants:    Discharged Condition: Improved  Hospital Course: [name] is an 64 y.o. female who was admitted 08/22/2014 for operative treatment of  HNP (herniated nucleus pulposus), lumbar. Patient failed conservative treatments (please see the history and physical for the specifics) and had severe unremitting pain that affects sleep, daily activities and work/hobbies. After pre-op clearance, the patient was taken to the operating room on 08/22/2014 and underwent  Procedure(s): Right L2-3 and right L1-2 transforaminal lumbar interbody fusion with pedicle screws, rods, sleeves, cages, local bone graft, Vivigen, cancellous chips.    Patient was given perioperative antibiotics:  Anti-infectives    Start     Dose/Rate Route Frequency Ordered Stop   08/22/14 1900  vancomycin (VANCOCIN) IVPB 1000 mg/200 mL premix  Status:  Discontinued     1,000 mg 200 mL/hr over 60 Minutes Intravenous Every 12 hours 08/22/14 1731 08/25/14 2039   08/22/14 0600  vancomycin (VANCOCIN) IVPB 1000 mg/200 mL premix     1,000 mg 200 mL/hr over 60 Minutes Intravenous On call to O.R. 08/21/14 1312 08/22/14 0820       Patient was given sequential compression devices and early ambulation to prevent DVT.   Patient benefited maximally from hospital stay and there were no complications. At the time of discharge, the patient was urinating/moving their bowels without difficulty, tolerating a regular diet, pain is controlled with oral pain medications and they have been cleared by PT/OT.   Recent vital signs: No data found.    Recent laboratory studies: No results for input(s): WBC, HGB, HCT, PLT, NA, K, CL, CO2, BUN, CREATININE, GLUCOSE, INR, CALCIUM in the  last 72 hours.  Invalid input(s): PT, 2   Discharge Medications:     Medication List    TAKE these medications        acetaminophen 325 MG tablet  Commonly known as:  TYLENOL  Take 650 mg by mouth every 6 (six) hours as needed for mild pain.     aspirin 81 MG tablet  Take 81 mg by mouth daily.     DSS 100 MG Caps  Take 100 mg by mouth 2 (two) times daily.     docusate sodium 100 MG capsule  Commonly  known as:  COLACE  Take 1 capsule (100 mg total) by mouth 2 (two) times daily.     Empagliflozin-Linagliptin 25-5 MG Tabs  Take 1 tablet by mouth daily.     gabapentin 300 MG capsule  Commonly known as:  NEURONTIN  Take 1 capsule (300 mg total) by mouth 3 (three) times daily.     ibuprofen 200 MG tablet  Commonly known as:  ADVIL,MOTRIN  Take 600 mg by mouth every 4 (four) hours as needed for moderate pain.     insulin aspart 100 UNIT/ML FlexPen  Commonly known as:  NOVOLOG  Inject 15 Units into the skin 3 (three) times daily with meals.     insulin glargine 100 unit/mL Sopn  Commonly known as:  LANTUS  Inject 0.45 mLs (45 Units total) into the skin 2 (two) times daily.     levothyroxine 100 MCG tablet  Commonly known as:  SYNTHROID, LEVOTHROID  Take 1 tablet (100 mcg total) by mouth daily.     losartan-hydrochlorothiazide 50-12.5 MG per tablet  Commonly known as:  HYZAAR  Take 1 tablet by mouth daily.     meloxicam 15 MG tablet  Commonly known as:  MOBIC  Take 15 mg by mouth daily.     metFORMIN 850 MG tablet  Commonly known as:  GLUCOPHAGE  TAKE 1 TABLET BY MOUTH TWICE A DAY WITH MEALS     metFORMIN 850 MG tablet  Commonly known as:  GLUCOPHAGE  Take 1 tablet (850 mg total) by mouth 2 (two) times daily with a meal.     methocarbamol 500 MG tablet  Commonly known as:  ROBAXIN  Take 1 tablet (500 mg total) by mouth every 6 (six) hours as needed for muscle spasms.     OVER THE COUNTER MEDICATION  Take 20 mLs by mouth daily as needed (acid reflux). Good Sense Anti Acid Solution     oxyCODONE-acetaminophen 5-325 MG per tablet  Commonly known as:  PERCOCET/ROXICET  Take 1 tablet by mouth every 6 (six) hours as needed for severe pain.     oxyCODONE-acetaminophen 5-325 MG per tablet  Commonly known as:  PERCOCET/ROXICET  Take 1-2 tablets by mouth every 4 (four) hours as needed for moderate pain.     Pen Needles 5/16" 30G X 8 MM Misc  Use to inject insulin 5x daily.      polyethylene glycol packet  Commonly known as:  MIRALAX / GLYCOLAX  Take 17 g by mouth daily as needed (constipation).     promethazine 25 MG tablet  Commonly known as:  PHENERGAN  Take 1 tablet (25 mg total) by mouth every 8 (eight) hours as needed for nausea or vomiting.     promethazine 25 MG tablet  Commonly known as:  PHENERGAN  Take 1 tablet (25 mg total) by mouth every 8 (eight) hours as needed for nausea or vomiting.  ranitidine 150 MG tablet  Commonly known as:  ZANTAC  Take 150 mg by mouth as needed for heartburn.     tiZANidine 2 MG tablet  Commonly known as:  ZANAFLEX  Take 1 tablet (2 mg total) by mouth every 6 (six) hours as needed for muscle spasms.     Vitamin D 2000 UNITS Caps  Take 4,000 Units by mouth daily.        Diagnostic Studies: Dg Lumbar Spine Complete  08/22/2014   CLINICAL DATA:  Hardware extension, right L2-3 and right L1-2 transforaminal lumbar interbody fusion with pedicle screws, rods, CT's, cages and local bone graft,Vivigen, cancellous chips.  EXAM: DG C-ARM GT 120 MIN; LUMBAR SPINE - COMPLETE 4+ VIEW  FLUOROSCOPY TIME:  Radiation Exposure Index (as provided by the fluoroscopic device):  If the device does not provide the exposure index:  Fluoroscopy Time (in minutes and seconds):  1 minutes 22 seconds  Number of Acquired Images:  4  COMPARISON:  04/30/2012, 02/04/2014, 04/30/2012 and MRI 06/02/2014  FINDINGS: Vertebral body level labeling will reflect the above history, although note that prior exams suggest transitional anatomy as labeling has varied.  Exam demonstrates no change in posterior fusion hardware L3 through L5 with extension of posterior fusion hardware at the L1 and L2 levels intact. Continued mild spondylosis of the lumbar spine and continued subtle grade 1 anterolisthesis of L3 on L4 unchanged.  IMPRESSION: Findings compatible with recent postsurgical superior extension of posterior fusion hardware which is intact.    Electronically Signed   By: Marin Olp M.D.   On: 08/22/2014 14:14   Dg Chest Port 1 View  08/22/2014   CLINICAL DATA:  Interval placement of left IJ line.  EXAM: PORTABLE CHEST - 1 VIEW  COMPARISON:  Two-view chest x-ray 07/29/2014  FINDINGS: A left IJ line is in place. The tip is at the junction of the innominate vein and superior vena cava. There is no pneumothorax. Patient is rotated to the right. Lung volumes are low. Borderline cardiomegaly is present. Increased interstitial markings suggests edema or atelectasis.  IMPRESSION: 1. Interval placement of left IJ line without pneumothorax. 2. Decreased lung volumes with borderline cardiomegaly. 3. Increased diffuse interstitial markings suggesting edema or atelectasis.   Electronically Signed   By: San Morelle M.D.   On: 08/22/2014 16:11   Dg C-arm Gt 120 Min  08/22/2014   CLINICAL DATA:  Hardware extension, right L2-3 and right L1-2 transforaminal lumbar interbody fusion with pedicle screws, rods, CT's, cages and local bone graft,Vivigen, cancellous chips.  EXAM: DG C-ARM GT 120 MIN; LUMBAR SPINE - COMPLETE 4+ VIEW  FLUOROSCOPY TIME:  Radiation Exposure Index (as provided by the fluoroscopic device):  If the device does not provide the exposure index:  Fluoroscopy Time (in minutes and seconds):  1 minutes 22 seconds  Number of Acquired Images:  4  COMPARISON:  04/30/2012, 02/04/2014, 04/30/2012 and MRI 06/02/2014  FINDINGS: Vertebral body level labeling will reflect the above history, although note that prior exams suggest transitional anatomy as labeling has varied.  Exam demonstrates no change in posterior fusion hardware L3 through L5 with extension of posterior fusion hardware at the L1 and L2 levels intact. Continued mild spondylosis of the lumbar spine and continued subtle grade 1 anterolisthesis of L3 on L4 unchanged.  IMPRESSION: Findings compatible with recent postsurgical superior extension of posterior fusion hardware which is intact.    Electronically Signed   By: Marin Olp M.D.   On: 08/22/2014 14:14  Discharge Instructions    Call MD / Call 911    Complete by:  As directed   If you experience chest pain or shortness of breath, CALL 911 and be transported to the hospital emergency room.  If you develope a fever above 101 F, pus (white drainage) or increased drainage or redness at the wound, or calf pain, call your surgeon's office.     Constipation Prevention    Complete by:  As directed   Drink plenty of fluids.  Prune juice may be helpful.  You may use a stool softener, such as Colace (over the counter) 100 mg twice a day.  Use MiraLax (over the counter) for constipation as needed.     Diet - low sodium heart healthy    Complete by:  As directed      Discharge instructions    Complete by:  As directed   Call if there is increasing drainage, fever greater than 101.5, severe head aches, and worsening nausea or light sensitivity. If shortness of breath, bloody cough or chest tightness or pain go to an emergency room. No lifting greater than 10 lbs. Avoid bending, stooping and twisting. Use brace when sitting and out of bed even to go to bathroom. Walk in house for first 2 weeks then may start to get out slowly increasing distances up to one half mile by 4-6 weeks post op. After 5 days may shower and change dressing following bathing with shower.When bathing remove the brace shower and replace brace before getting out of the shower. If drainage, keep dry dressing and do not bathe the incision, use an moisture impervious dressing. Please call and return for scheduled follow up appointment 2 weeks from the time of surgery.     Driving restrictions    Complete by:  As directed   No driving for 4 weeks     Increase activity slowly as tolerated    Complete by:  As directed      Lifting restrictions    Complete by:  As directed   No lifting for 6 weeks           Follow-up Information    Follow up with  NITKA,Nolie Bignell E, MD In 2 weeks.   Specialty:  Orthopedic Surgery   Contact information:   [place] [address] [phone]       Follow up with [facility].   Why:  Someone from [facility] will contact you concerning start date and time for therapy.   Contact information:   [address] [place] [address] [phone]       Discharge Plan:  discharge to home  Disposition:     Signed: Lanae Crumbly  09/09/2014, 2:57 PM

## 2014-09-09 NOTE — Telephone Encounter (Signed)
Amy from [facility] called stating that she came to visit pt and she has c/o dizziness and lightheadness, she took pt's bp which read 108/64 and then decreased to 96/54 pt is taking losartan HCTZ and states has she has lost weight since surgery..   Wants to know if you want to change her BP medications?  Please call pt if there are any changes.

## 2014-09-10 DIAGNOSIS — M4316 Spondylolisthesis, lumbar region: Secondary | ICD-10-CM | POA: Diagnosis not present

## 2014-09-10 DIAGNOSIS — M47817 Spondylosis without myelopathy or radiculopathy, lumbosacral region: Secondary | ICD-10-CM | POA: Diagnosis not present

## 2014-09-10 DIAGNOSIS — M5116 Intervertebral disc disorders with radiculopathy, lumbar region: Secondary | ICD-10-CM | POA: Diagnosis not present

## 2014-09-10 NOTE — Telephone Encounter (Signed)
LMOVM all information in note below

## 2014-09-11 DIAGNOSIS — K589 Irritable bowel syndrome without diarrhea: Secondary | ICD-10-CM | POA: Diagnosis not present

## 2014-09-11 DIAGNOSIS — Z96641 Presence of right artificial hip joint: Secondary | ICD-10-CM | POA: Diagnosis not present

## 2014-09-11 DIAGNOSIS — K219 Gastro-esophageal reflux disease without esophagitis: Secondary | ICD-10-CM | POA: Diagnosis not present

## 2014-09-11 DIAGNOSIS — Z4789 Encounter for other orthopedic aftercare: Secondary | ICD-10-CM | POA: Diagnosis not present

## 2014-09-11 DIAGNOSIS — Z853 Personal history of malignant neoplasm of breast: Secondary | ICD-10-CM | POA: Diagnosis not present

## 2014-09-11 DIAGNOSIS — E1142 Type 2 diabetes mellitus with diabetic polyneuropathy: Secondary | ICD-10-CM | POA: Diagnosis not present

## 2014-09-11 DIAGNOSIS — I10 Essential (primary) hypertension: Secondary | ICD-10-CM | POA: Diagnosis not present

## 2014-09-11 DIAGNOSIS — Z794 Long term (current) use of insulin: Secondary | ICD-10-CM | POA: Diagnosis not present

## 2014-09-13 ENCOUNTER — Emergency Department (HOSPITAL_COMMUNITY)
Admission: EM | Admit: 2014-09-13 | Discharge: 2014-09-14 | Disposition: A | Discharge status: Home / Self Care | Payer: Commercial Managed Care - HMO | Attending: Emergency Medicine | Admitting: Emergency Medicine

## 2014-09-13 ENCOUNTER — Emergency Department (HOSPITAL_COMMUNITY): Payer: Commercial Managed Care - HMO

## 2014-09-13 ENCOUNTER — Encounter (HOSPITAL_COMMUNITY): Payer: Self-pay

## 2014-09-13 DIAGNOSIS — Z8669 Personal history of other diseases of the nervous system and sense organs: Secondary | ICD-10-CM | POA: Diagnosis not present

## 2014-09-13 DIAGNOSIS — G43909 Migraine, unspecified, not intractable, without status migrainosus: Secondary | ICD-10-CM | POA: Insufficient documentation

## 2014-09-13 DIAGNOSIS — E039 Hypothyroidism, unspecified: Secondary | ICD-10-CM | POA: Diagnosis not present

## 2014-09-13 DIAGNOSIS — Z87442 Personal history of urinary calculi: Secondary | ICD-10-CM | POA: Diagnosis not present

## 2014-09-13 DIAGNOSIS — M1990 Unspecified osteoarthritis, unspecified site: Secondary | ICD-10-CM | POA: Insufficient documentation

## 2014-09-13 DIAGNOSIS — E119 Type 2 diabetes mellitus without complications: Secondary | ICD-10-CM | POA: Diagnosis not present

## 2014-09-13 DIAGNOSIS — M5430 Sciatica, unspecified side: Secondary | ICD-10-CM | POA: Insufficient documentation

## 2014-09-13 DIAGNOSIS — Z880 Allergy status to penicillin: Secondary | ICD-10-CM | POA: Diagnosis not present

## 2014-09-13 DIAGNOSIS — Z794 Long term (current) use of insulin: Secondary | ICD-10-CM | POA: Insufficient documentation

## 2014-09-13 DIAGNOSIS — K219 Gastro-esophageal reflux disease without esophagitis: Secondary | ICD-10-CM | POA: Diagnosis not present

## 2014-09-13 DIAGNOSIS — N390 Urinary tract infection, site not specified: Secondary | ICD-10-CM | POA: Diagnosis not present

## 2014-09-13 DIAGNOSIS — I10 Essential (primary) hypertension: Secondary | ICD-10-CM | POA: Insufficient documentation

## 2014-09-13 DIAGNOSIS — Z853 Personal history of malignant neoplasm of breast: Secondary | ICD-10-CM | POA: Insufficient documentation

## 2014-09-13 DIAGNOSIS — Z79899 Other long term (current) drug therapy: Secondary | ICD-10-CM | POA: Diagnosis not present

## 2014-09-13 DIAGNOSIS — G8929 Other chronic pain: Secondary | ICD-10-CM | POA: Diagnosis not present

## 2014-09-13 DIAGNOSIS — I209 Angina pectoris, unspecified: Secondary | ICD-10-CM | POA: Insufficient documentation

## 2014-09-13 DIAGNOSIS — Z7982 Long term (current) use of aspirin: Secondary | ICD-10-CM | POA: Insufficient documentation

## 2014-09-13 DIAGNOSIS — R1032 Left lower quadrant pain: Secondary | ICD-10-CM | POA: Diagnosis present

## 2014-09-13 LAB — CBC WITH DIFFERENTIAL/PLATELET
Basophils Absolute: 0.1 10*3/uL (ref 0.0–0.1)
Basophils Relative: 1 % (ref 0–1)
EOS ABS: 0.2 10*3/uL (ref 0.0–0.7)
EOS PCT: 4 % (ref 0–5)
HCT: 38.7 % (ref 36.0–46.0)
Hemoglobin: 12 g/dL (ref 12.0–15.0)
Lymphocytes Relative: 29 % (ref 12–46)
Lymphs Abs: 2 10*3/uL (ref 0.7–4.0)
MCH: 28.4 pg (ref 26.0–34.0)
MCHC: 31 g/dL (ref 30.0–36.0)
MCV: 91.7 fL (ref 78.0–100.0)
MONO ABS: 0.5 10*3/uL (ref 0.1–1.0)
Monocytes Relative: 7 % (ref 3–12)
NEUTROS ABS: 4.2 10*3/uL (ref 1.7–7.7)
NEUTROS PCT: 61 % (ref 43–77)
Platelets: 387 10*3/uL (ref 150–400)
RBC: 4.22 MIL/uL (ref 3.87–5.11)
RDW: 14.1 % (ref 11.5–15.5)
WBC: 7 10*3/uL (ref 4.0–10.5)

## 2014-09-13 LAB — COMPREHENSIVE METABOLIC PANEL
ALK PHOS: 74 U/L (ref 38–126)
ALT: 14 U/L (ref 14–54)
ANION GAP: 9 (ref 5–15)
AST: 22 U/L (ref 15–41)
Albumin: 4 g/dL (ref 3.5–5.0)
BUN: 17 mg/dL (ref 6–20)
CHLORIDE: 101 mmol/L (ref 101–111)
CO2: 26 mmol/L (ref 22–32)
Calcium: 10.2 mg/dL (ref 8.9–10.3)
Creatinine, Ser: 1.13 mg/dL — ABNORMAL HIGH (ref 0.44–1.00)
GFR calc Af Amer: 58 mL/min — ABNORMAL LOW (ref >60)
GFR calc non Af Amer: 50 mL/min — ABNORMAL LOW (ref >60)
Glucose, Bld: 150 mg/dL — ABNORMAL HIGH (ref 65–99)
Potassium: 4.2 mmol/L (ref 3.5–5.1)
Sodium: 136 mmol/L (ref 135–145)
Total Bilirubin: 0.4 mg/dL (ref 0.3–1.2)
Total Protein: 8.5 g/dL — ABNORMAL HIGH (ref 6.5–8.1)

## 2014-09-13 LAB — I-STAT CG4 LACTIC ACID, ED: LACTIC ACID, VENOUS: 0.56 mmol/L (ref 0.5–2.0)

## 2014-09-13 LAB — URINALYSIS, ROUTINE W REFLEX MICROSCOPIC
Bilirubin Urine: NEGATIVE
Hgb urine dipstick: NEGATIVE
KETONES UR: NEGATIVE mg/dL
Nitrite: NEGATIVE
PH: 6 (ref 5.0–8.0)
PROTEIN: NEGATIVE mg/dL
Specific Gravity, Urine: 1.035 — ABNORMAL HIGH (ref 1.005–1.030)
UROBILINOGEN UA: 0.2 mg/dL (ref 0.0–1.0)

## 2014-09-13 LAB — URINE MICROSCOPIC-ADD ON

## 2014-09-13 LAB — LIPASE, BLOOD: Lipase: 25 U/L (ref 22–51)

## 2014-09-13 MED ORDER — CEFTRIAXONE SODIUM 1 G IJ SOLR
1.0000 g | Freq: Once | INTRAMUSCULAR | Status: AC
  Administered 2014-09-13: 1 g via INTRAVENOUS
  Filled 2014-09-13: qty 10

## 2014-09-13 MED ORDER — IOHEXOL 300 MG/ML  SOLN
100.0000 mL | Freq: Once | INTRAMUSCULAR | Status: AC | PRN
Start: 2014-09-13 — End: 2014-09-13
  Administered 2014-09-13: 100 mL via INTRAVENOUS

## 2014-09-13 MED ORDER — SODIUM CHLORIDE 0.9 % IV SOLN
Freq: Once | INTRAVENOUS | Status: AC
  Administered 2014-09-13: 22:00:00 via INTRAVENOUS

## 2014-09-13 MED ORDER — ONDANSETRON HCL 4 MG/2ML IJ SOLN
4.0000 mg | Freq: Once | INTRAMUSCULAR | Status: AC
  Administered 2014-09-13: 4 mg via INTRAVENOUS
  Filled 2014-09-13: qty 2

## 2014-09-13 MED ORDER — IOHEXOL 300 MG/ML  SOLN: 25.0000 mL | Freq: Once | INTRAMUSCULAR | Status: AC | PRN

## 2014-09-13 MED ORDER — MORPHINE SULFATE 4 MG/ML IJ SOLN
4.0000 mg | Freq: Once | INTRAMUSCULAR | Status: AC
  Administered 2014-09-13: 4 mg via INTRAVENOUS
  Filled 2014-09-13: qty 1

## 2014-09-13 NOTE — ED Notes (Signed)
S/p 08-22-14 lumbar spinal fusion with screws/rods.  Onset 2:30pm LLQ abd pain woke pt up.  Pain radiates across lower abd to right side.  Pt has had urinary hesitancy for the past 2 weeks.  Pt vomited x 1 last week, did have vomiting prior to surgery.  Surgical incision pain 3/10.

## 2014-09-13 NOTE — ED Notes (Signed)
Pt took Tylenol @ around 5pm, then took Oxycodone @ 6pm with no relief.

## 2014-09-13 NOTE — ED Provider Notes (Signed)
CSN: [id number]     Arrival date & time 09/13/14  1833 History   First MD Initiated Contact with Patient 09/13/14 2104     Chief Complaint  Patient presents with  . Abdominal Pain     (Consider location/radiation/quality/duration/timing/severity/associated sxs/prior Treatment) HPI Comments: Patient presents with left flank and left lower abdominal pain onset this morning around 2 AM. It woke her up from sleep. The pain comes and goes but is severe at times and associated with chills. She's had urinary frequency and hesitancy for the past 2 weeks. She had vomiting 4 days ago but none since. Notably patient underwent lumbar spinal fusion on May 13 that was uncomplicated. She denies any bladder or bowel incontinence. She denies any focal weakness, numbness or tingling. She denies any drainage or bleeding from her incision. She does report a history of kidney stones. She also has remote history of breast cancer. She denies any chest pain or shortness of breath. She denies any headache. He denies pain with urination but does have frequency and urgency. She denies any blood in her urine.  The history is provided by the patient.    Past Medical History  Diagnosis Date  . GERD (gastroesophageal reflux disease)   . Hypothyroidism   . Peripheral neuropathy   . Cardiomyopathy   . Chest pain 06/2005    Hospitalized, dystolic dysfunction  . Sciatica   . Type II diabetes mellitus   . PONV (postoperative nausea and vomiting)   . H/O hiatal hernia   . Migraines     "it's been a long time since I've had one" (03/20/2012)  . Osteoarthritis   . Arthritis     "all over; mostly in my back" (03/20/2012)  . Chronic lower back pain     "have 2 herniated discs; going to have to have a fusion" (03/20/2012)  . Breast cancer     "right" (03/20/2012)  . Family history of anesthesia complication     "father has nausea and vomiting"  . Irritable bowel syndrome   . Hypertension     "patient states never had  HTN, takes Hyzaar for heart  . History of right mastectomy   . Anginal pain 03/19/2012    saw Dr. Cathie Olden .. she thinks its more related to stomach issues  . History of kidney stones    Past Surgical History  Procedure Laterality Date  . Abdominal hysterectomy  1993  . Total knee arthroplasty  2000    Right  . Mastectomy with axillary lymph node dissection  12/2003    "right" (03/20/2012)  . Thyroidectomy  1977    Right  . Posterior cervical fusion/foraminotomy  2001  . Shoulder arthroscopy w/ rotator cuff repair  2003    Left  . Cardiac catheterization  ? 2005 / 2007  . Adenosin cardiolite  07/2005    (+) wall motion abnormality  . Esophagogastroduodenoscopy  2005    GERD  . [place] of abdomen  07/2005    Fatty liver / kidney stones  . Ct abdomen and pelvis  02/2010    Same  . Knee arthroscopy  1990's    "right" (03/20/2012)  . Axillary lymph node dissection  2003    squamous cell cancer  . Back surgery    . Lumbar laminectomy/decompression microdiscectomy N/A 02/04/2014    Procedure: RIGHT L2-3 MICRODISCECTOMY ;  Surgeon: Jessy Oto, MD;  Location: [facility];  Service: Orthopedics;  Laterality: N/A;  . Lumbar fusion N/A 08/22/2014  Procedure: Right L2-3 and right L1-2 transforaminal lumbar interbody fusion with pedicle screws, rods, sleeves, cages, local bone graft, Vivigen, cancellous chips;  Surgeon: Jessy Oto, MD;  Location: [facility];  Service: Orthopedics;  Laterality: N/A;   Family History  Problem Relation Age of Onset  . Hypertension Mother   . Diabetes Father   . Hypertension Father   . Hypertension Sister   . Diabetes Other     Family Hx. of Diabetes  . Hypertension Other     Family History of HTN  . Hypertension Brother   . Hypertension Brother   . Diabetes Brother    History  Substance Use Topics  . Smoking status: Never Smoker   . Smokeless tobacco: Never Used  . Alcohol Use: No   OB History    No data available     Review of Systems   Constitutional: Positive for chills, activity change and appetite change. Negative for fever.  HENT: Negative for congestion and rhinorrhea.   Respiratory: Negative for cough, chest tightness and shortness of breath.   Cardiovascular: Negative for chest pain.  Gastrointestinal: Positive for nausea and abdominal pain. Negative for vomiting.  Genitourinary: Positive for urgency. Negative for dysuria, vaginal bleeding and vaginal discharge.  Musculoskeletal: Positive for back pain. Negative for myalgias and arthralgias.  Skin: Negative for rash.  Neurological: Negative for dizziness, weakness and headaches.  A complete 10 system review of systems was obtained and all systems are negative except as noted in the HPI and PMH.      Allergies  Dilaudid; Adhesive; Ampicillin; and Penicillins  Home Medications   Prior to Admission medications   Medication Sig Start Date End Date Taking? Authorizing Provider  acetaminophen (TYLENOL) 325 MG tablet Take 650 mg by mouth every 6 (six) hours as needed for mild pain.   Yes Historical Provider, MD  aspirin 81 MG tablet Take 81 mg by mouth daily.   Yes Historical Provider, MD  Cholecalciferol (VITAMIN D) 2000 UNITS CAPS Take 4,000 Units by mouth daily.   Yes Historical Provider, MD  Empagliflozin-Linagliptin 25-5 MG TABS Take 1 tablet by mouth daily.   Yes Historical Provider, MD  gabapentin (NEURONTIN) 300 MG capsule Take 1 capsule (300 mg total) by mouth 3 (three) times daily. Patient taking differently: Take 300 mg by mouth daily as needed (nerve pain).  01/09/14  Yes Susy Frizzle, MD  ibuprofen (ADVIL,MOTRIN) 200 MG tablet Take 400 mg by mouth every 4 (four) hours as needed for moderate pain.    Yes Historical Provider, MD  insulin aspart (NOVOLOG) 100 UNIT/ML FlexPen Inject 15 Units into the skin 3 (three) times daily with meals. 04/24/14  Yes Susy Frizzle, MD  insulin glargine (LANTUS) 100 unit/mL SOPN Inject 0.45 mLs (45 Units total) into  the skin 2 (two) times daily. 04/24/14  Yes Susy Frizzle, MD  levothyroxine (SYNTHROID, LEVOTHROID) 100 MCG tablet Take 1 tablet (100 mcg total) by mouth daily. 10/29/13  Yes Susy Frizzle, MD  losartan-hydrochlorothiazide (HYZAAR) 50-12.5 MG per tablet Take 1 tablet by mouth daily. Patient taking differently: Take 0.5 tablets by mouth daily.  10/29/13  Yes Susy Frizzle, MD  metFORMIN (GLUCOPHAGE) 850 MG tablet Take 1 tablet (850 mg total) by mouth 2 (two) times daily with a meal. 08/25/14  Yes Jessy Oto, MD  methocarbamol (ROBAXIN) 500 MG tablet Take 1 tablet (500 mg total) by mouth every 6 (six) hours as needed for muscle spasms. 02/05/14  Yes Daleen Bo  Louanne Skye, MD  oxyCODONE-acetaminophen (PERCOCET) 10-325 MG per tablet Take 1 tablet by mouth every 6 (six) hours as needed. 09/03/14  Yes Historical Provider, MD  polyethylene glycol (MIRALAX / GLYCOLAX) packet Take 17 g by mouth daily as needed (constipation).    Yes Historical Provider, MD  promethazine (PHENERGAN) 25 MG tablet Take 1 tablet (25 mg total) by mouth every 8 (eight) hours as needed for nausea or vomiting. 08/25/14  Yes Jessy Oto, MD  ranitidine (ZANTAC) 150 MG tablet Take 150 mg by mouth as needed for heartburn.   Yes Historical Provider, MD  Simethicone (ALKA-SELTZER ANTI-GAS PO) Take 2 tablets by mouth as needed (for heartburn).   Yes Historical Provider, MD  docusate sodium (COLACE) 100 MG capsule Take 1 capsule (100 mg total) by mouth 2 (two) times daily. 08/25/14   Jessy Oto, MD  docusate sodium 100 MG CAPS Take 100 mg by mouth 2 (two) times daily. Patient taking differently: Take 100 mg by mouth 2 (two) times daily as needed (constipation).  05/04/12   Phillips Hay, PA-C  Insulin Pen Needle (PEN NEEDLES 5/16") 30G X 8 MM MISC Use to inject insulin 5x daily. 12/31/13   Susy Frizzle, MD  metFORMIN (GLUCOPHAGE) 850 MG tablet TAKE 1 TABLET BY MOUTH TWICE A DAY WITH MEALS 08/25/14   Susy Frizzle, MD   oxyCODONE-acetaminophen (PERCOCET/ROXICET) 5-325 MG per tablet Take 1-2 tablets by mouth every 4 (four) hours as needed for moderate pain. 08/25/14   Jessy Oto, MD  promethazine (PHENERGAN) 25 MG tablet Take 1 tablet (25 mg total) by mouth every 8 (eight) hours as needed for nausea or vomiting. 02/14/14   Alycia Rossetti, MD  tiZANidine (ZANAFLEX) 2 MG tablet Take 1 tablet (2 mg total) by mouth every 6 (six) hours as needed for muscle spasms. 08/25/14   Jessy Oto, MD   BP 135/65 mmHg  Pulse 66  Temp(Src) 99.1 F (37.3 C) (Rectal)  Resp 11  Ht 5\' 5"  (1.651 m)  SpO2 95% Physical Exam  Constitutional: She is oriented to person, place, and time. She appears well-developed and well-nourished. No distress.  uncomfortable  HENT:  Head: Normocephalic and atraumatic.  Mouth/Throat: Oropharynx is clear and moist. No oropharyngeal exudate.  Eyes: Conjunctivae and EOM are normal. Pupils are equal, round, and reactive to light.  Neck: Normal range of motion. Neck supple.  No meningismus.  Cardiovascular: Normal rate, regular rhythm, normal heart sounds and intact distal pulses.   No murmur heard. Pulmonary/Chest: Effort normal and breath sounds normal. No respiratory distress.  Abdominal: Soft. There is tenderness. There is no rebound and no guarding.  TTP LLQ and flank  Musculoskeletal: Normal range of motion. She exhibits no edema or tenderness.  Midline incision appropriately healing. No erythema or drainage. No CVA tenderness  5/5 strength in bilateral lower extremities. Ankle plantar and dorsiflexion intact. Great toe extension intact bilaterally. +2 DP and PT pulses. Normal gait.   Neurological: She is alert and oriented to person, place, and time. No cranial nerve deficit. She exhibits normal muscle tone. Coordination normal.  No ataxia on finger to nose bilaterally. No pronator drift. 5/5 strength throughout. CN 2-12 intact. Negative Romberg. Equal grip strength. Sensation intact.  Gait is normal.   Skin: Skin is warm.  Psychiatric: She has a normal mood and affect. Her behavior is normal.  Nursing note and vitals reviewed.   ED Course  Procedures (including critical care time) Labs Review Labs Reviewed  COMPREHENSIVE  METABOLIC PANEL - Abnormal; Notable for the following:    Glucose, Bld 150 (*)    Creatinine, Ser 1.13 (*)    Total Protein 8.5 (*)    GFR calc non Af Amer 50 (*)    GFR calc Af Amer 58 (*)    All other components within normal limits  URINALYSIS, ROUTINE W REFLEX MICROSCOPIC (NOT AT [facility]) - Abnormal; Notable for the following:    Specific Gravity, Urine 1.035 (*)    Glucose, UA >1000 (*)    Leukocytes, UA SMALL (*)    All other components within normal limits  URINE MICROSCOPIC-ADD ON - Abnormal; Notable for the following:    Squamous Epithelial / LPF FEW (*)    All other components within normal limits  URINE CULTURE  CBC WITH DIFFERENTIAL/PLATELET  LIPASE, BLOOD  I-STAT CG4 LACTIC ACID, ED  I-STAT CG4 LACTIC ACID, ED    Imaging Review No results found.   EKG Interpretation None      MDM   Final diagnoses:  None   1 day of left flank and lower abdominal pain with urinary urgency and frequency. No focal weakness, numbness or tingling. Recent back surgery in May 13.  Urinalysis shows white blood cells with small leukocytes. Also has yeast.  Lactate normal. No leukocytosis.  She feels more comfortable after pain and nausea medication. MAXIMUM TEMPERATURE 99.1. Her back incision appears clean and intact. She has no strength or motor deficits in her leg. Patient's pain is flank and lower left quadrant of her abdomen. Doubt coming from her back.  We'll obtain CT scan to evaluate for possible kidney stone versus pyelonephritis versus other pathology. Pending at time of sign out to Dr. Sharol Given.    Ezequiel Essex, MD 09/14/14 [phone]

## 2014-09-14 DIAGNOSIS — I10 Essential (primary) hypertension: Secondary | ICD-10-CM | POA: Diagnosis not present

## 2014-09-14 DIAGNOSIS — E119 Type 2 diabetes mellitus without complications: Secondary | ICD-10-CM | POA: Diagnosis not present

## 2014-09-14 DIAGNOSIS — K219 Gastro-esophageal reflux disease without esophagitis: Secondary | ICD-10-CM | POA: Diagnosis not present

## 2014-09-14 DIAGNOSIS — N281 Cyst of kidney, acquired: Secondary | ICD-10-CM | POA: Diagnosis not present

## 2014-09-14 DIAGNOSIS — E039 Hypothyroidism, unspecified: Secondary | ICD-10-CM | POA: Diagnosis not present

## 2014-09-14 DIAGNOSIS — N390 Urinary tract infection, site not specified: Secondary | ICD-10-CM | POA: Diagnosis not present

## 2014-09-14 DIAGNOSIS — M1990 Unspecified osteoarthritis, unspecified site: Secondary | ICD-10-CM | POA: Diagnosis not present

## 2014-09-14 DIAGNOSIS — G8929 Other chronic pain: Secondary | ICD-10-CM | POA: Diagnosis not present

## 2014-09-14 DIAGNOSIS — K4090 Unilateral inguinal hernia, without obstruction or gangrene, not specified as recurrent: Secondary | ICD-10-CM | POA: Diagnosis not present

## 2014-09-14 DIAGNOSIS — N200 Calculus of kidney: Secondary | ICD-10-CM | POA: Diagnosis not present

## 2014-09-14 DIAGNOSIS — G43909 Migraine, unspecified, not intractable, without status migrainosus: Secondary | ICD-10-CM | POA: Diagnosis not present

## 2014-09-14 DIAGNOSIS — K429 Umbilical hernia without obstruction or gangrene: Secondary | ICD-10-CM | POA: Diagnosis not present

## 2014-09-14 DIAGNOSIS — M5430 Sciatica, unspecified side: Secondary | ICD-10-CM | POA: Diagnosis not present

## 2014-09-14 LAB — I-STAT CG4 LACTIC ACID, ED: Lactic Acid, Venous: 1.22 mmol/L (ref 0.5–2.0)

## 2014-09-14 MED ORDER — MORPHINE SULFATE 4 MG/ML IJ SOLN
4.0000 mg | Freq: Once | INTRAMUSCULAR | Status: AC
Start: 2014-09-14 — End: 2014-09-14
  Administered 2014-09-14: 4 mg via INTRAVENOUS
  Filled 2014-09-14: qty 1

## 2014-09-14 MED ORDER — CEPHALEXIN 500 MG PO CAPS: 500.0000 mg | ORAL_CAPSULE | Freq: Four times a day (QID) | ORAL | Status: DC

## 2014-09-14 MED ORDER — FLUCONAZOLE 100 MG PO TABS
150.0000 mg | ORAL_TABLET | Freq: Once | ORAL | Status: AC
  Administered 2014-09-14: 150 mg via ORAL
  Filled 2014-09-14: qty 2

## 2014-09-14 MED ORDER — ONDANSETRON HCL 4 MG/2ML IJ SOLN
4.0000 mg | Freq: Once | INTRAMUSCULAR | Status: AC
  Administered 2014-09-14: 4 mg via INTRAVENOUS
  Filled 2014-09-14: qty 2

## 2014-09-14 MED ORDER — ONDANSETRON HCL 4 MG PO TABS: 4.0000 mg | ORAL_TABLET | Freq: Four times a day (QID) | ORAL | Status: DC

## 2014-09-14 NOTE — Discharge Instructions (Signed)
Urinary Tract Infection Take the antibiotics as prescribed. Follow up with your doctor. Return to the ED if you develop new or worsening symptoms Urinary tract infections (UTIs) can develop anywhere along your urinary tract. Your urinary tract is your body's drainage system for removing wastes and extra water. Your urinary tract includes two kidneys, two ureters, a bladder, and a urethra. Your kidneys are a pair of bean-shaped organs. Each kidney is about the size of your fist. They are located below your ribs, one on each side of your spine. CAUSES Infections are caused by microbes, which are microscopic organisms, including fungi, viruses, and bacteria. These organisms are so small that they can only be seen through a microscope. Bacteria are the microbes that most commonly cause UTIs. SYMPTOMS  Symptoms of UTIs may vary by age and gender of the patient and by the location of the infection. Symptoms in young women typically include a frequent and intense urge to urinate and a painful, burning feeling in the bladder or urethra during urination. Older women and men are more likely to be tired, shaky, and weak and have muscle aches and abdominal pain. A fever may mean the infection is in your kidneys. Other symptoms of a kidney infection include pain in your back or sides below the ribs, nausea, and vomiting. DIAGNOSIS To diagnose a UTI, your caregiver will ask you about your symptoms. Your caregiver also will ask to provide a urine sample. The urine sample will be tested for bacteria and white blood cells. White blood cells are made by your body to help fight infection. TREATMENT  Typically, UTIs can be treated with medication. Because most UTIs are caused by a bacterial infection, they usually can be treated with the use of antibiotics. The choice of antibiotic and length of treatment depend on your symptoms and the type of bacteria causing your infection. HOME CARE INSTRUCTIONS  If you were prescribed  antibiotics, take them exactly as your caregiver instructs you. Finish the medication even if you feel better after you have only taken some of the medication.  Drink enough water and fluids to keep your urine clear or pale yellow.  Avoid caffeine, tea, and carbonated beverages. They tend to irritate your bladder.  Empty your bladder often. Avoid holding urine for long periods of time.  Empty your bladder before and after sexual intercourse.  After a bowel movement, women should cleanse from front to back. Use each tissue only once. SEEK MEDICAL CARE IF:   You have back pain.  You develop a fever.  Your symptoms do not begin to resolve within 3 days. SEEK IMMEDIATE MEDICAL CARE IF:   You have severe back pain or lower abdominal pain.  You develop chills.  You have nausea or vomiting.  You have continued burning or discomfort with urination. MAKE SURE YOU:   Understand these instructions.  Will watch your condition.  Will get help right away if you are not doing well or get worse. Document Released: 01/05/2005 Document Revised: 09/27/2011 Document Reviewed: 05/06/2011 [facility] Patient Information 2015 [place], [place]. This information is not intended to replace advice given to you by your health care provider. Make sure you discuss any questions you have with your health care provider.

## 2014-09-14 NOTE — ED Provider Notes (Signed)
Care assumed from Dr Melanee Left.  Pt with left flank pain since this morning, also with back surgery last month.  Awaiting CT scan for possible kidney stone, urine concerning for infection, has received rocephin.  Should be stable for d/c after scan if no acute findings.  Results for orders placed or performed during the hospital encounter of 09/13/14  CBC with Differential  Result Value Ref Range   WBC 7.0 4.0 - 10.5 K/uL   RBC 4.22 3.87 - 5.11 MIL/uL   Hemoglobin 12.0 12.0 - 15.0 g/dL   HCT 38.7 36.0 - 46.0 %   MCV 91.7 78.0 - 100.0 fL   MCH 28.4 26.0 - 34.0 pg   MCHC 31.0 30.0 - 36.0 g/dL   RDW 14.1 11.5 - 15.5 %   Platelets 387 150 - 400 K/uL   Neutrophils Relative % 61 43 - 77 %   Neutro Abs 4.2 1.7 - 7.7 K/uL   Lymphocytes Relative 29 12 - 46 %   Lymphs Abs 2.0 0.7 - 4.0 K/uL   Monocytes Relative 7 3 - 12 %   Monocytes Absolute 0.5 0.1 - 1.0 K/uL   Eosinophils Relative 4 0 - 5 %   Eosinophils Absolute 0.2 0.0 - 0.7 K/uL   Basophils Relative 1 0 - 1 %   Basophils Absolute 0.1 0.0 - 0.1 K/uL  Comprehensive metabolic panel  Result Value Ref Range   Sodium 136 135 - 145 mmol/L   Potassium 4.2 3.5 - 5.1 mmol/L   Chloride 101 101 - 111 mmol/L   CO2 26 22 - 32 mmol/L   Glucose, Bld 150 (H) 65 - 99 mg/dL   BUN 17 6 - 20 mg/dL   Creatinine, Ser 1.13 (H) 0.44 - 1.00 mg/dL   Calcium 10.2 8.9 - 10.3 mg/dL   Total Protein 8.5 (H) 6.5 - 8.1 g/dL   Albumin 4.0 3.5 - 5.0 g/dL   AST 22 15 - 41 U/L   ALT 14 14 - 54 U/L   Alkaline Phosphatase 74 38 - 126 U/L   Total Bilirubin 0.4 0.3 - 1.2 mg/dL   GFR calc non Af Amer 50 (L) >60 mL/min   GFR calc Af Amer 58 (L) >60 mL/min   Anion gap 9 5 - 15  Lipase, blood  Result Value Ref Range   Lipase 25 22 - 51 U/L  Urinalysis, Routine w reflex microscopic  Result Value Ref Range   Color, Urine YELLOW YELLOW   APPearance CLEAR CLEAR   Specific Gravity, Urine 1.035 (H) 1.005 - 1.030   pH 6.0 5.0 - 8.0   Glucose, UA >1000 (A) NEGATIVE mg/dL   Hgb urine dipstick NEGATIVE NEGATIVE   Bilirubin Urine NEGATIVE NEGATIVE   Ketones, ur NEGATIVE NEGATIVE mg/dL   Protein, ur NEGATIVE NEGATIVE mg/dL   Urobilinogen, UA 0.2 0.0 - 1.0 mg/dL   Nitrite NEGATIVE NEGATIVE   Leukocytes, UA SMALL (A) NEGATIVE  Urine microscopic-add on  Result Value Ref Range   Squamous Epithelial / LPF FEW (A) RARE   WBC, UA 21-50 <3 WBC/hpf   Urine-Other FEW YEAST   I-Stat CG4 Lactic Acid, ED  Result Value Ref Range   Lactic Acid, Venous 0.56 0.5 - 2.0 mmol/L  I-Stat CG4 Lactic Acid, ED  Result Value Ref Range   Lactic Acid, Venous 1.22 0.5 - 2.0 mmol/L   Dg Lumbar Spine Complete  08/22/2014   CLINICAL DATA:  Hardware extension, right L2-3 and right L1-2 transforaminal lumbar interbody fusion with pedicle  screws, rods, CT's, cages and local bone graft,Vivigen, cancellous chips.  EXAM: DG C-ARM GT 120 MIN; LUMBAR SPINE - COMPLETE 4+ VIEW  FLUOROSCOPY TIME:  Radiation Exposure Index (as provided by the fluoroscopic device):  If the device does not provide the exposure index:  Fluoroscopy Time (in minutes and seconds):  1 minutes 22 seconds  Number of Acquired Images:  4  COMPARISON:  04/30/2012, 02/04/2014, 04/30/2012 and MRI 06/02/2014  FINDINGS: Vertebral body level labeling will reflect the above history, although note that prior exams suggest transitional anatomy as labeling has varied.  Exam demonstrates no change in posterior fusion hardware L3 through L5 with extension of posterior fusion hardware at the L1 and L2 levels intact. Continued mild spondylosis of the lumbar spine and continued subtle grade 1 anterolisthesis of L3 on L4 unchanged.  IMPRESSION: Findings compatible with recent postsurgical superior extension of posterior fusion hardware which is intact.   Electronically Signed   By: Marin Olp M.D.   On: 08/22/2014 14:14   Ct Abdomen Pelvis W Contrast  09/14/2014   CLINICAL DATA:  Subacute onset of left lower quadrant abdominal pain and vomiting.  Constipation. Initial encounter.  EXAM: CT ABDOMEN AND PELVIS WITH CONTRAST  TECHNIQUE: Multidetector CT imaging of the abdomen and pelvis was performed using the standard protocol following bolus administration of intravenous contrast.  CONTRAST:  167mL OMNIPAQUE IOHEXOL 300 MG/ML  SOLN  COMPARISON:  CT of the abdomen and pelvis performed 02/14/2014, and lumbar spine MRI performed 06/02/2014  FINDINGS: The visualized lung bases are clear. The patient is status post right-sided mastectomy. Mild soft tissue inflammation is noted along the anterior mid chest wall.  A small calcified granuloma is noted at the left hepatic lobe. The liver and spleen are otherwise unremarkable. Mildly increased attenuation dependently within the gallbladder may reflect tiny stones. The pancreas and adrenal glands are unremarkable.  A 3 mm nonobstructing stone is noted at the interpole region of the right kidney. A 7 mm parenchymal calcification is noted at the left kidney. An apparent 1.7 cm parapelvic cyst is noted at the lower pole of the left kidney. Mild nonspecific perinephric stranding is noted bilaterally. There is no evidence of hydronephrosis. No obstructing ureteral stones are seen.  No free fluid is identified. The small bowel is unremarkable in appearance. The stomach is within normal limits. No acute vascular abnormalities are seen. Minimal calcification is noted along the abdominal aorta.  A small umbilical hernia is seen, containing only fat.  The appendix is normal in caliber, without evidence for appendicitis. A single diverticulum is noted along the descending colon. The colon is unremarkable in appearance.  The bladder is mildly distended and grossly unremarkable. The patient is status post hysterectomy. No suspicious adnexal masses are seen. No inguinal lymphadenopathy is seen. Small to moderate bilateral inguinal hernias are seen, containing only fat.  Postoperative soft tissue stranding is noted along the mid lower  back.  No acute osseous abnormalities are identified. Transitional lumbosacral anatomy is again noted. The patient is status post lumbar sacral spinal fusion at L2-S1, with underlying decompression. Mild grade 1 retrolisthesis of L3 on L4 and grade 1 anterolisthesis of L4 on L5 is stable from prior studies.  IMPRESSION: 1. No acute abnormality seen to explain the patient's symptoms. 2. Mild soft tissue inflammation noted along the anterior mid chest wall. 3. 3 mm nonobstructing stone at the interpole region of the right kidney. Small left parapelvic renal cyst again noted. 4. Small umbilical hernia, containing only  fat. 5. Small to moderate bilateral inguinal hernias, containing only fat. 6. Postoperative change at the lumbar spine.   Electronically Signed   By: Garald Balding M.D.   On: 09/14/2014 01:10   Dg Chest Port 1 View  08/22/2014   CLINICAL DATA:  Interval placement of left IJ line.  EXAM: PORTABLE CHEST - 1 VIEW  COMPARISON:  Two-view chest x-ray 07/29/2014  FINDINGS: A left IJ line is in place. The tip is at the junction of the innominate vein and superior vena cava. There is no pneumothorax. Patient is rotated to the right. Lung volumes are low. Borderline cardiomegaly is present. Increased interstitial markings suggests edema or atelectasis.  IMPRESSION: 1. Interval placement of left IJ line without pneumothorax. 2. Decreased lung volumes with borderline cardiomegaly. 3. Increased diffuse interstitial markings suggesting edema or atelectasis.   Electronically Signed   By: San Morelle M.D.   On: 08/22/2014 16:11   Dg C-arm Gt 120 Min  08/22/2014   CLINICAL DATA:  Hardware extension, right L2-3 and right L1-2 transforaminal lumbar interbody fusion with pedicle screws, rods, CT's, cages and local bone graft,Vivigen, cancellous chips.  EXAM: DG C-ARM GT 120 MIN; LUMBAR SPINE - COMPLETE 4+ VIEW  FLUOROSCOPY TIME:  Radiation Exposure Index (as provided by the fluoroscopic device):  If the  device does not provide the exposure index:  Fluoroscopy Time (in minutes and seconds):  1 minutes 22 seconds  Number of Acquired Images:  4  COMPARISON:  04/30/2012, 02/04/2014, 04/30/2012 and MRI 06/02/2014  FINDINGS: Vertebral body level labeling will reflect the above history, although note that prior exams suggest transitional anatomy as labeling has varied.  Exam demonstrates no change in posterior fusion hardware L3 through L5 with extension of posterior fusion hardware at the L1 and L2 levels intact. Continued mild spondylosis of the lumbar spine and continued subtle grade 1 anterolisthesis of L3 on L4 unchanged.  IMPRESSION: Findings compatible with recent postsurgical superior extension of posterior fusion hardware which is intact.   Electronically Signed   By: Marin Olp M.D.   On: 08/22/2014 14:14      Linton Flemings, MD 09/14/14 [phone]

## 2014-09-15 LAB — URINE CULTURE

## 2014-09-16 ENCOUNTER — Ambulatory Visit (INDEPENDENT_AMBULATORY_CARE_PROVIDER_SITE_OTHER): Payer: Commercial Managed Care - HMO | Admitting: Family Medicine

## 2014-09-16 VITALS — BP 136/70 | HR 88 | Temp 98.4°F | Resp 16 | Ht 65.0 in | Wt 203.0 lb

## 2014-09-16 DIAGNOSIS — T402X5A Adverse effect of other opioids, initial encounter: Secondary | ICD-10-CM

## 2014-09-16 DIAGNOSIS — K5909 Other constipation: Secondary | ICD-10-CM

## 2014-09-16 DIAGNOSIS — N3000 Acute cystitis without hematuria: Secondary | ICD-10-CM

## 2014-09-16 DIAGNOSIS — K5903 Drug induced constipation: Secondary | ICD-10-CM

## 2014-09-16 DIAGNOSIS — B379 Candidiasis, unspecified: Secondary | ICD-10-CM

## 2014-09-16 MED ORDER — FLUCONAZOLE 150 MG PO TABS: 150.0000 mg | ORAL_TABLET | Freq: Once | ORAL | Status: DC

## 2014-09-16 MED ORDER — NALOXEGOL OXALATE 25 MG PO TABS: 25.0000 mg | ORAL_TABLET | Freq: Every day | ORAL | Status: DC

## 2014-09-16 MED ORDER — EMPAGLIFLOZIN-LINAGLIPTIN 25-5 MG PO TABS: 1.0000 | ORAL_TABLET | Freq: Every day | ORAL | Status: DC

## 2014-09-16 MED ORDER — FLUCONAZOLE 150 MG PO TABS: ORAL_TABLET | ORAL | Status: DC

## 2014-09-16 NOTE — Patient Instructions (Signed)
Complete antibiotics Take Movantik- take 1 tablet daily for next week- to get bowels moving Take yeast pill as prescribed F/U with Dr. Dennard Schaumann as scheduled

## 2014-09-16 NOTE — Progress Notes (Signed)
Patient ID: [name], female   DOB: May 08, 1950, 64 y.o.   MRN: [id number]   Subjective:    Patient ID: [name], female    DOB: 1951-02-01, 64 y.o.   MRN: [id number]  Patient presents for L Side Pain  patient here with left-sided abdominal pain. It actually started around Friday evening. It was quite severe in the left side she has history of kidney stones therefore she was worried about this. She was seen in the emergency room CT scan was done which was fairly unremarkable there was no sign of pyelonephritis no sign of any stone on that side there was no colitis. She is actually being constipated is not had a bowel movement in 4 days she is status post back surgery and is on high-dose pain medications. On further evaluation her urinalysis showed yeast as well as possible infection therefore she is currently on antibodies and thinks that has helped. She denies any fever no nausea vomiting      Review Of Systems:  GEN- denies fatigue, fever, weight loss,weakness, recent illness HEENT- denies eye drainage, change in vision, nasal discharge, CVS- denies chest pain, palpitations RESP- denies SOB, cough, wheeze ABD- denies N/V, change in stools, abd pain GU- denies dysuria, hematuria, dribbling, incontinence MSK- denies joint pain, muscle aches, injury Neuro- denies headache, dizziness, syncope, seizure activity       Objective:    BP 136/70 mmHg  Pulse 88  Temp(Src) 98.4 F (36.9 C) (Oral)  Resp 16  Ht 5\' 5"  (1.651 m)  Wt 203 lb (92.08 kg)  BMI 33.78 kg/m2 GEN- NAD, alert and oriented x3 HEENT- PERRL, EOMI, non injected sclera, pink conjunctiva, MMM, oropharynx clear Neck- Supple, no thyromegaly CVS- RRR, no murmur RESP-CTAB ABD-NABS,soft,TTP suprapubic region, LLQ/LUQ, no rebound,ND Skin- Lumbar incision- d/c/i  EXT- No edema Pulses- Radial 2+        Assessment & Plan:      Problem List Items Addressed This Visit    None    Visit Diagnoses    Yeast  infection    -  Primary    Diflucan given    Relevant Medications    fluconazole (DIFLUCAN) 150 MG tablet    Constipation due to opioid therapy        Given Movantik for constipation due to high dose opiods, likely cause of abd pain as well    Acute cystitis without hematuria        complete antibiotics       Note: This dictation was prepared with Dragon dictation along with smaller phrase technology. Any transcriptional errors that result from this process are unintentional.

## 2014-09-17 ENCOUNTER — Telehealth: Payer: Self-pay | Admitting: *Deleted

## 2014-09-17 NOTE — Telephone Encounter (Signed)
Submitted [facility] referral thru [facility] for authorization on May 31 for Dr. Basil Dess, MD with authorization [id number]  Requesting provider:Warren Pickard,MD  Treating provider: Larae Grooms  Number of visits:6  Start Date:09/10/14  End Date:03/09/15  Dx: M43.16-Spondylolisthesis, lumbar region       M51.16- Intervertebral disc disorders w radiculopathy, lumber region       M96.1-Postlaminectomy syndrome, not else where classified  Copy has been faxed to [facility] for records

## 2014-10-02 DIAGNOSIS — M4316 Spondylolisthesis, lumbar region: Secondary | ICD-10-CM | POA: Diagnosis not present

## 2014-10-10 DIAGNOSIS — Z780 Asymptomatic menopausal state: Secondary | ICD-10-CM | POA: Diagnosis not present

## 2014-10-17 ENCOUNTER — Other Ambulatory Visit: Payer: Self-pay | Admitting: Surgery

## 2014-10-17 DIAGNOSIS — M545 Low back pain: Secondary | ICD-10-CM

## 2014-10-21 ENCOUNTER — Ambulatory Visit
Admission: RE | Admit: 2014-10-21 | Discharge: 2014-10-21 | Disposition: A | Discharge status: Home / Self Care | Payer: Commercial Managed Care - HMO | Source: Ambulatory Visit | Attending: Surgery | Admitting: Surgery

## 2014-10-21 DIAGNOSIS — M545 Low back pain: Secondary | ICD-10-CM

## 2014-10-21 DIAGNOSIS — Z981 Arthrodesis status: Secondary | ICD-10-CM | POA: Diagnosis not present

## 2014-11-04 ENCOUNTER — Ambulatory Visit (INDEPENDENT_AMBULATORY_CARE_PROVIDER_SITE_OTHER): Payer: Commercial Managed Care - HMO | Admitting: Family Medicine

## 2014-11-04 ENCOUNTER — Encounter: Payer: Self-pay | Admitting: Family Medicine

## 2014-11-04 VITALS — BP 120/68 | HR 84 | Temp 98.3°F | Resp 18 | Ht 65.0 in

## 2014-11-04 DIAGNOSIS — IMO0002 Reserved for concepts with insufficient information to code with codable children: Secondary | ICD-10-CM

## 2014-11-04 DIAGNOSIS — E1165 Type 2 diabetes mellitus with hyperglycemia: Secondary | ICD-10-CM

## 2014-11-04 DIAGNOSIS — E1141 Type 2 diabetes mellitus with diabetic mononeuropathy: Secondary | ICD-10-CM | POA: Diagnosis not present

## 2014-11-04 DIAGNOSIS — E119 Type 2 diabetes mellitus without complications: Secondary | ICD-10-CM | POA: Diagnosis not present

## 2014-11-04 LAB — HEMOGLOBIN A1C
HEMOGLOBIN A1C: 7.5 % — AB (ref <5.7)
Mean Plasma Glucose: 169 mg/dL — ABNORMAL HIGH (ref <117)

## 2014-11-04 NOTE — Progress Notes (Signed)
Subjective:    Patient ID: [name], female    DOB: [birth year], 64 y.o.   MRN: [id number]  HPI  Patient is here today for follow-up of her diabetes mellitus. She states her fasting blood sugars are typically less than 120 and her two-hour postprandial sugars are typically less than 160 at night. She denies any hypoglycemic episodes. She does complain severe pain in her lower back and radiating burning stinging pain down her left leg. She recently had back surgery. She also complains of burning stinging pain in both of her feet consistent with diabetic neuropathy. Past Medical History  Diagnosis Date  . GERD (gastroesophageal reflux disease)   . Hypothyroidism   . Peripheral neuropathy   . Cardiomyopathy   . Chest pain 06/2005    Hospitalized, dystolic dysfunction  . Sciatica   . Type II diabetes mellitus   . PONV (postoperative nausea and vomiting)   . H/O hiatal hernia   . Migraines     "it's been a long time since I've had one" (03/20/2012)  . Osteoarthritis   . Arthritis     "all over; mostly in my back" (03/20/2012)  . Chronic lower back pain     "have 2 herniated discs; going to have to have a fusion" (03/20/2012)  . Breast cancer     "right" (03/20/2012)  . Family history of anesthesia complication     "father has nausea and vomiting"  . Irritable bowel syndrome   . Hypertension     "patient states never had HTN, takes Hyzaar for heart  . History of right mastectomy   . Anginal pain 03/19/2012    saw Dr. Cathie Olden .. she thinks its more related to stomach issues  . History of kidney stones    Current Outpatient Prescriptions on File Prior to Visit  Medication Sig Dispense Refill  . acetaminophen (TYLENOL) 325 MG tablet Take 650 mg by mouth every 6 (six) hours as needed for mild pain.    [name] Kitchen aspirin 81 MG tablet Take 81 mg by mouth daily.    . Empagliflozin-Linagliptin 25-5 MG TABS Take 1 tablet by mouth daily. (Patient taking differently: Take 1 tablet by mouth  daily. Glyxambi) 30 tablet 3  . gabapentin (NEURONTIN) 300 MG capsule Take 1 capsule (300 mg total) by mouth 3 (three) times daily. (Patient taking differently: Take 300 mg by mouth daily as needed (nerve pain). ) 90 capsule 3  . ibuprofen (ADVIL,MOTRIN) 200 MG tablet Take 400 mg by mouth every 4 (four) hours as needed for moderate pain.     [name] Kitchen insulin aspart (NOVOLOG) 100 UNIT/ML FlexPen Inject 15 Units into the skin 3 (three) times daily with meals. 5 pen 11  . insulin glargine (LANTUS) 100 unit/mL SOPN Inject 0.45 mLs (45 Units total) into the skin 2 (two) times daily. 15 mL 5  . Insulin Pen Needle (PEN NEEDLES 5/16") 30G X 8 MM MISC Use to inject insulin 5x daily. 200 each 11  . levothyroxine (SYNTHROID, LEVOTHROID) 100 MCG tablet Take 1 tablet (100 mcg total) by mouth daily. 30 tablet 11  . losartan-hydrochlorothiazide (HYZAAR) 50-12.5 MG per tablet Take 1 tablet by mouth daily. (Patient taking differently: Take 0.5 tablets by mouth daily. ) 30 tablet 11  . metFORMIN (GLUCOPHAGE) 850 MG tablet Take 1 tablet (850 mg total) by mouth 2 (two) times daily with a meal. 60 tablet 3  . naloxegol oxalate (MOVANTIK) 25 MG TABS tablet Take 1 tablet (25 mg total) by mouth daily.  9 tablet 0  . ondansetron (ZOFRAN) 4 MG tablet Take 1 tablet (4 mg total) by mouth every 6 (six) hours. 12 tablet 0  . polyethylene glycol (MIRALAX / GLYCOLAX) packet Take 17 g by mouth daily as needed (constipation).     . promethazine (PHENERGAN) 25 MG tablet Take 1 tablet (25 mg total) by mouth every 8 (eight) hours as needed for nausea or vomiting. 30 tablet 0  . ranitidine (ZANTAC) 150 MG tablet Take 150 mg by mouth as needed for heartburn.    . Simethicone (ALKA-SELTZER ANTI-GAS PO) Take 2 tablets by mouth as needed (for heartburn).    [name] Kitchen tiZANidine (ZANAFLEX) 2 MG tablet Take 1 tablet (2 mg total) by mouth every 6 (six) hours as needed for muscle spasms. 50 tablet 0  . methocarbamol (ROBAXIN) 500 MG tablet Take 1 tablet (500 mg  total) by mouth every 6 (six) hours as needed for muscle spasms. (Patient not taking: Reported on 11/04/2014) 40 tablet 1   No current facility-administered medications on file prior to visit.   Past Surgical History  Procedure Laterality Date  . Abdominal hysterectomy  1993  . Total knee arthroplasty  2000    Right  . Mastectomy with axillary lymph node dissection  12/2003    "right" (03/20/2012)  . Thyroidectomy  1977    Right  . Posterior cervical fusion/foraminotomy  2001  . Shoulder arthroscopy w/ rotator cuff repair  2003    Left  . Cardiac catheterization  ? 2005 / 2007  . Adenosin cardiolite  07/2005    (+) wall motion abnormality  . Esophagogastroduodenoscopy  2005    GERD  . [place] of abdomen  07/2005    Fatty liver / kidney stones  . Ct abdomen and pelvis  02/2010    Same  . Knee arthroscopy  1990's    "right" (03/20/2012)  . Axillary lymph node dissection  2003    squamous cell cancer  . Back surgery    . Lumbar laminectomy/decompression microdiscectomy N/A 02/04/2014    Procedure: RIGHT L2-3 MICRODISCECTOMY ;  Surgeon: Jessy Oto, MD;  Location: [facility];  Service: Orthopedics;  Laterality: N/A;  . Lumbar fusion N/A 08/22/2014    Procedure: Right L2-3 and right L1-2 transforaminal lumbar interbody fusion with pedicle screws, rods, sleeves, cages, local bone graft, Vivigen, cancellous chips;  Surgeon: Jessy Oto, MD;  Location: [facility];  Service: Orthopedics;  Laterality: N/A;   Allergies  Allergen Reactions  . Dilaudid [Hydromorphone Hcl] Nausea And Vomiting  . Adhesive [Tape] Rash  . Ampicillin Rash    REACTION: Rash and welts on her hands when she took it in the hospital  . Penicillins Rash   History   Social History  . Marital Status: Married    Spouse Name: N/A  . Number of Children: 1  . Years of Education: N/A   Occupational History  . Retired since 2003    Social History Main Topics  . Smoking status: Never Smoker   . Smokeless tobacco: Never  Used  . Alcohol Use: No  . Drug Use: No  . Sexual Activity: Not on file   Other Topics Concern  . Not on file   Social History Narrative       Review of Systems  All other systems reviewed and are negative.      Objective:   Physical Exam  Cardiovascular: Normal rate, regular rhythm and normal heart sounds.   No murmur heard. Pulmonary/Chest: Effort normal and  breath sounds normal. No respiratory distress. She has no wheezes. She has no rales.  Abdominal: Soft. Bowel sounds are normal. She exhibits no distension. There is no tenderness. There is no rebound and no guarding.  Musculoskeletal: She exhibits no edema.  Vitals reviewed.         Assessment & Plan:  Diabetes mellitus type II, uncontrolled - Plan: Hemoglobin A1c  Diabetic mononeuropathy associated with type 2 diabetes mellitus  Patient's reported blood sugars sound well controlled. Her blood pressure today is well controlled. I will check a hemoglobin A1c. Goal hemoglobin A1c is less than 7. I discussed with the patient discontinuing gabapentin and replacing that with Lyrica or Cymbalta or combination of both.  She will consider.

## 2014-11-06 ENCOUNTER — Other Ambulatory Visit: Payer: Self-pay | Admitting: Surgery

## 2014-11-06 DIAGNOSIS — M545 Low back pain: Secondary | ICD-10-CM

## 2014-11-16 ENCOUNTER — Emergency Department (HOSPITAL_COMMUNITY): Payer: Commercial Managed Care - HMO

## 2014-11-16 ENCOUNTER — Encounter (HOSPITAL_COMMUNITY): Payer: Self-pay

## 2014-11-16 ENCOUNTER — Observation Stay (HOSPITAL_COMMUNITY)
Admission: EM | Admit: 2014-11-16 | Discharge: 2014-11-18 | Disposition: A | Discharge status: Home / Self Care | Payer: Commercial Managed Care - HMO | Attending: Internal Medicine | Admitting: Internal Medicine

## 2014-11-16 DIAGNOSIS — E119 Type 2 diabetes mellitus without complications: Secondary | ICD-10-CM | POA: Insufficient documentation

## 2014-11-16 DIAGNOSIS — R1013 Epigastric pain: Secondary | ICD-10-CM | POA: Diagnosis not present

## 2014-11-16 DIAGNOSIS — G43909 Migraine, unspecified, not intractable, without status migrainosus: Secondary | ICD-10-CM | POA: Diagnosis not present

## 2014-11-16 DIAGNOSIS — Z9011 Acquired absence of right breast and nipple: Secondary | ICD-10-CM | POA: Insufficient documentation

## 2014-11-16 DIAGNOSIS — Z7983 Long term (current) use of bisphosphonates: Secondary | ICD-10-CM | POA: Diagnosis not present

## 2014-11-16 DIAGNOSIS — R079 Chest pain, unspecified: Principal | ICD-10-CM | POA: Insufficient documentation

## 2014-11-16 DIAGNOSIS — Z87442 Personal history of urinary calculi: Secondary | ICD-10-CM | POA: Diagnosis not present

## 2014-11-16 DIAGNOSIS — Z9889 Other specified postprocedural states: Secondary | ICD-10-CM | POA: Diagnosis not present

## 2014-11-16 DIAGNOSIS — G8929 Other chronic pain: Secondary | ICD-10-CM | POA: Insufficient documentation

## 2014-11-16 DIAGNOSIS — I209 Angina pectoris, unspecified: Secondary | ICD-10-CM | POA: Insufficient documentation

## 2014-11-16 DIAGNOSIS — R0789 Other chest pain: Secondary | ICD-10-CM | POA: Diagnosis not present

## 2014-11-16 DIAGNOSIS — J9811 Atelectasis: Secondary | ICD-10-CM | POA: Diagnosis not present

## 2014-11-16 DIAGNOSIS — K219 Gastro-esophageal reflux disease without esophagitis: Secondary | ICD-10-CM | POA: Diagnosis not present

## 2014-11-16 DIAGNOSIS — I10 Essential (primary) hypertension: Secondary | ICD-10-CM | POA: Insufficient documentation

## 2014-11-16 DIAGNOSIS — Z853 Personal history of malignant neoplasm of breast: Secondary | ICD-10-CM | POA: Diagnosis not present

## 2014-11-16 DIAGNOSIS — R0602 Shortness of breath: Secondary | ICD-10-CM | POA: Diagnosis not present

## 2014-11-16 DIAGNOSIS — Z880 Allergy status to penicillin: Secondary | ICD-10-CM | POA: Insufficient documentation

## 2014-11-16 DIAGNOSIS — E1140 Type 2 diabetes mellitus with diabetic neuropathy, unspecified: Secondary | ICD-10-CM

## 2014-11-16 DIAGNOSIS — M159 Polyosteoarthritis, unspecified: Secondary | ICD-10-CM | POA: Diagnosis not present

## 2014-11-16 DIAGNOSIS — Z7982 Long term (current) use of aspirin: Secondary | ICD-10-CM | POA: Insufficient documentation

## 2014-11-16 DIAGNOSIS — E039 Hypothyroidism, unspecified: Secondary | ICD-10-CM | POA: Insufficient documentation

## 2014-11-16 DIAGNOSIS — I5032 Chronic diastolic (congestive) heart failure: Secondary | ICD-10-CM | POA: Diagnosis present

## 2014-11-16 DIAGNOSIS — Z794 Long term (current) use of insulin: Secondary | ICD-10-CM | POA: Diagnosis not present

## 2014-11-16 LAB — CBC
HCT: 42.7 % (ref 36.0–46.0)
Hemoglobin: 13.4 g/dL (ref 12.0–15.0)
MCH: 27.1 pg (ref 26.0–34.0)
MCHC: 31.4 g/dL (ref 30.0–36.0)
MCV: 86.4 fL (ref 78.0–100.0)
Platelets: 278 10*3/uL (ref 150–400)
RBC: 4.94 MIL/uL (ref 3.87–5.11)
RDW: 15.1 % (ref 11.5–15.5)
WBC: 7.6 10*3/uL (ref 4.0–10.5)

## 2014-11-16 LAB — BASIC METABOLIC PANEL
Anion gap: 10 (ref 5–15)
BUN: 14 mg/dL (ref 6–20)
CO2: 26 mmol/L (ref 22–32)
Calcium: 9.9 mg/dL (ref 8.9–10.3)
Chloride: 105 mmol/L (ref 101–111)
Creatinine, Ser: 0.9 mg/dL (ref 0.44–1.00)
GFR calc Af Amer: >60 mL/min (ref >60)
GFR calc non Af Amer: >60 mL/min (ref >60)
Glucose, Bld: 242 mg/dL — ABNORMAL HIGH (ref 65–99)
Potassium: 4.4 mmol/L (ref 3.5–5.1)
Sodium: 141 mmol/L (ref 135–145)

## 2014-11-16 LAB — I-STAT TROPONIN, ED: Troponin i, poc: 0 ng/mL (ref 0.00–0.08)

## 2014-11-16 LAB — D-DIMER, QUANTITATIVE: D-Dimer, Quant: 1.75 ug/mL-FEU — ABNORMAL HIGH (ref 0.00–0.48)

## 2014-11-16 MED ORDER — IOHEXOL 350 MG/ML SOLN
80.0000 mL | Freq: Once | INTRAVENOUS | Status: AC | PRN
  Administered 2014-11-16: 80 mL via INTRAVENOUS

## 2014-11-16 MED ORDER — NITROGLYCERIN 0.4 MG SL SUBL
0.4000 mg | SUBLINGUAL_TABLET | SUBLINGUAL | Status: AC | PRN
  Administered 2014-11-16 (×3): 0.4 mg via SUBLINGUAL
  Filled 2014-11-16: qty 1

## 2014-11-16 MED ORDER — MORPHINE SULFATE 4 MG/ML IJ SOLN
4.0000 mg | Freq: Once | INTRAMUSCULAR | Status: AC
  Administered 2014-11-17: 4 mg via INTRAVENOUS
  Filled 2014-11-16: qty 1

## 2014-11-16 MED ORDER — ASPIRIN 81 MG PO CHEW
324.0000 mg | CHEWABLE_TABLET | Freq: Once | ORAL | Status: AC
  Administered 2014-11-16: 324 mg via ORAL
  Filled 2014-11-16: qty 4

## 2014-11-16 MED ORDER — MORPHINE SULFATE 4 MG/ML IJ SOLN
4.0000 mg | Freq: Once | INTRAMUSCULAR | Status: AC
  Administered 2014-11-16: 4 mg via INTRAVENOUS
  Filled 2014-11-16: qty 1

## 2014-11-16 NOTE — ED Notes (Signed)
Patient transported to CT with transporter

## 2014-11-16 NOTE — ED Notes (Signed)
RN called CT to inform pt now has sufficient IV access

## 2014-11-16 NOTE — ED Provider Notes (Signed)
CSN: [id number]     Arrival date & time 11/16/14  1912 History   First MD Initiated Contact with Patient 11/16/14 1945     Chief Complaint  Patient presents with  . Chest Pain     (Consider location/radiation/quality/duration/timing/severity/associated sxs/prior Treatment) HPI  64 year old female presents with severe mid chest pain that started around 12 PM. Is constant and seems to be getting worse. It is both a pressure and sharp. Worse with inspiration. She states it feels like she had pulled a muscle except that she did not have any trauma or anything that could've caused a pulled muscle. It feels like is taking her breath away. No diaphoresis, nausea, or vomiting. Pain is 10/10. No prior history of coronary disease and no prior history of pain like this. No leg swelling or leg pain. No coughing or hemoptysis.  Past Medical History  Diagnosis Date  . GERD (gastroesophageal reflux disease)   . Hypothyroidism   . Peripheral neuropathy   . Cardiomyopathy   . Chest pain 06/2005    Hospitalized, dystolic dysfunction  . Sciatica   . Type II diabetes mellitus   . PONV (postoperative nausea and vomiting)   . H/O hiatal hernia   . Migraines     "it's been a long time since I've had one" (03/20/2012)  . Osteoarthritis   . Arthritis     "all over; mostly in my back" (03/20/2012)  . Chronic lower back pain     "have 2 herniated discs; going to have to have a fusion" (03/20/2012)  . Breast cancer     "right" (03/20/2012)  . Family history of anesthesia complication     "father has nausea and vomiting"  . Irritable bowel syndrome   . Hypertension     "patient states never had HTN, takes Hyzaar for heart  . History of right mastectomy   . Anginal pain 03/19/2012    saw Dr. Cathie Olden .. she thinks its more related to stomach issues  . History of kidney stones    Past Surgical History  Procedure Laterality Date  . Abdominal hysterectomy  1993  . Total knee arthroplasty  2000    Right   . Mastectomy with axillary lymph node dissection  12/2003    "right" (03/20/2012)  . Thyroidectomy  1977    Right  . Posterior cervical fusion/foraminotomy  2001  . Shoulder arthroscopy w/ rotator cuff repair  2003    Left  . Cardiac catheterization  ? 2005 / 2007  . Adenosin cardiolite  07/2005    (+) wall motion abnormality  . Esophagogastroduodenoscopy  2005    GERD  . [place] of abdomen  07/2005    Fatty liver / kidney stones  . Ct abdomen and pelvis  02/2010    Same  . Knee arthroscopy  1990's    "right" (03/20/2012)  . Axillary lymph node dissection  2003    squamous cell cancer  . Back surgery    . Lumbar laminectomy/decompression microdiscectomy N/A 02/04/2014    Procedure: RIGHT L2-3 MICRODISCECTOMY ;  Surgeon: Jessy Oto, MD;  Location: [facility];  Service: Orthopedics;  Laterality: N/A;  . Lumbar fusion N/A 08/22/2014    Procedure: Right L2-3 and right L1-2 transforaminal lumbar interbody fusion with pedicle screws, rods, sleeves, cages, local bone graft, Vivigen, cancellous chips;  Surgeon: Jessy Oto, MD;  Location: [facility];  Service: Orthopedics;  Laterality: N/A;   Family History  Problem Relation Age of Onset  . Hypertension Mother   .  Diabetes Father   . Hypertension Father   . Hypertension Sister   . Diabetes Other     Family Hx. of Diabetes  . Hypertension Other     Family History of HTN  . Hypertension Brother   . Hypertension Brother   . Diabetes Brother    History  Substance Use Topics  . Smoking status: Never Smoker   . Smokeless tobacco: Never Used  . Alcohol Use: No   OB History    No data available     Review of Systems  Constitutional: Negative for fever.  Respiratory: Positive for shortness of breath. Negative for cough.   Cardiovascular: Positive for chest pain. Negative for leg swelling.  Gastrointestinal: Negative for nausea, vomiting and abdominal pain.  Musculoskeletal: Negative for back pain.  All other systems reviewed and are  negative.     Allergies  Dilaudid; Adhesive; Ampicillin; and Penicillins  Home Medications   Prior to Admission medications   Medication Sig Start Date End Date Taking? Authorizing Provider  alendronate (FOSAMAX) 70 MG tablet Take 70 mg by mouth every Monday. 11/04/14  Yes Historical Provider, MD  aspirin 81 MG tablet Take 81 mg by mouth daily.   Yes Historical Provider, MD  Cholecalciferol (VITAMIN D3) 5000 UNITS CAPS Take 5,000 Units by mouth 2 (two) times daily.   Yes Historical Provider, MD  Empagliflozin-Linagliptin 25-5 MG TABS Take 1 tablet by mouth daily. Patient taking differently: Take 1 tablet by mouth daily. Glyxambi 09/16/14  Yes Alycia Rossetti, MD  gabapentin (NEURONTIN) 300 MG capsule Take 1 capsule (300 mg total) by mouth 3 (three) times daily. 01/09/14  Yes Susy Frizzle, MD  HYDROcodone-acetaminophen ([place]ath) 7.5-325 MG per tablet TAKE 1 TO 2 TABLETS BY MOUTH EVERY 4 TO 6 HOURS AS NEEDED FOR PAIN MAXIMUM 8 TABLETS PER DAY 09/19/14  Yes Historical Provider, MD  insulin glargine (LANTUS) 100 unit/mL SOPN Inject 0.45 mLs (45 Units total) into the skin 2 (two) times daily. 04/24/14  Yes Susy Frizzle, MD  levothyroxine (SYNTHROID, LEVOTHROID) 100 MCG tablet Take 1 tablet (100 mcg total) by mouth daily. 10/29/13  Yes Susy Frizzle, MD  losartan-hydrochlorothiazide (HYZAAR) 50-12.5 MG per tablet Take 1 tablet by mouth daily. 10/29/13  Yes Susy Frizzle, MD  metFORMIN (GLUCOPHAGE) 850 MG tablet Take 1 tablet (850 mg total) by mouth 2 (two) times daily with a meal. 08/25/14  Yes Jessy Oto, MD  Misc Natural Products (LAXATIVE FORMULA) TABS Take 1 tablet by mouth as needed (for BM).   Yes Historical Provider, MD  insulin aspart (NOVOLOG) 100 UNIT/ML FlexPen Inject 15 Units into the skin 3 (three) times daily with meals. Patient not taking: Reported on 11/16/2014 04/24/14   Susy Frizzle, MD  Insulin Pen Needle (PEN NEEDLES 5/16") 30G X 8 MM MISC Use to inject insulin 5x  daily. 12/31/13   Susy Frizzle, MD  methocarbamol (ROBAXIN) 500 MG tablet Take 1 tablet (500 mg total) by mouth every 6 (six) hours as needed for muscle spasms. Patient not taking: Reported on 11/04/2014 02/05/14   Jessy Oto, MD  naloxegol oxalate (MOVANTIK) 25 MG TABS tablet Take 1 tablet (25 mg total) by mouth daily. 09/16/14   Alycia Rossetti, MD  ondansetron (ZOFRAN) 4 MG tablet Take 1 tablet (4 mg total) by mouth every 6 (six) hours. 09/14/14   Ezequiel Essex, MD  promethazine (PHENERGAN) 25 MG tablet Take 1 tablet (25 mg total) by mouth every 8 (eight) hours  as needed for nausea or vomiting. 08/25/14   Jessy Oto, MD  tiZANidine (ZANAFLEX) 2 MG tablet Take 1 tablet (2 mg total) by mouth every 6 (six) hours as needed for muscle spasms. 08/25/14   Jessy Oto, MD   BP 131/69 mmHg  Pulse 78  Temp(Src) 98.3 F (36.8 C) (Oral)  Resp 26  Ht 5\' 5"  (1.651 m)  Wt 191 lb 9.6 oz (86.909 kg)  BMI 31.88 kg/m2  SpO2 97% Physical Exam  Constitutional: She is oriented to person, place, and time. She appears well-developed and well-nourished.  HENT:  Head: Normocephalic and atraumatic.  Right Ear: External ear normal.  Left Ear: External ear normal.  Nose: Nose normal.  Eyes: Right eye exhibits no discharge. Left eye exhibits no discharge.  Cardiovascular: Normal rate, regular rhythm and normal heart sounds.   Pulmonary/Chest: Effort normal and breath sounds normal. She exhibits tenderness (mid-sternal).  Abdominal: Soft. She exhibits no distension. There is no tenderness.  Musculoskeletal: She exhibits no edema or tenderness.  Neurological: She is alert and oriented to person, place, and time.  Skin: Skin is warm and dry.  Nursing note and vitals reviewed.   ED Course  Procedures (including critical care time) Labs Review Labs Reviewed  BASIC METABOLIC PANEL - Abnormal; Notable for the following:    Glucose, Bld 242 (*)    All other components within normal limits  D-DIMER,  QUANTITATIVE (NOT AT [facility]) - Abnormal; Notable for the following:    D-Dimer, Quant 1.75 (*)    All other components within normal limits  CBC  I-STAT TROPOININ, ED    Imaging Review Dg Chest 2 View  11/16/2014   CLINICAL DATA:  Patient with shortness of breath and chest pain for 1 day.  EXAM: CHEST  2 VIEW  COMPARISON:  Chest radiograph 08/22/2014  FINDINGS: Anterior cervical spinal fusion hardware. Stable cardiac and mediastinal contours. Tortuosity of the thoracic aorta. Elevation right hemidiaphragm. Right basilar atelectasis. No pulmonary consolidation. No pleural effusion or pneumothorax. Thoracic spine degenerative changes.  IMPRESSION: No acute cardiopulmonary process.   Electronically Signed   By: Lovey Newcomer M.D.   On: 11/16/2014 20:14     EKG Interpretation   Date/Time:  Sunday November 16 2014 19:13:26 EDT Ventricular Rate:  82 PR Interval:  162 QRS Duration: 90 QT Interval:  376 QTC Calculation: 439 R Axis:   -20 Text Interpretation:  Sinus rhythm with Fusion complexes Left ventricular  hypertrophy with repolarization abnormality Abnormal ECG no significant  change since April 2016 Confirmed by Regenia Skeeter  MD, Vietta Bonifield ([id number]) on  11/16/2014 7:45:40 PM      MDM   Final diagnoses:  Chest pain, unspecified chest pain type    Patient with midsternal chest pain that seems like it is most likely chest wall in etiology. However patient's pain is very difficult to control despite IV morphine. Nitroglycerin did no relief at all. Patient does not have acute EKG changes but there is concern for possible coronary source. Consulted hospitalist, Dr. Posey Pronto, given continued pain will need stepdown.     Sherwood Gambler, MD 11/17/14 [phone]

## 2014-11-16 NOTE — ED Notes (Signed)
Pt took Hydrocodone x 1 @ 2pm.

## 2014-11-16 NOTE — ED Notes (Signed)
Patient transported to X-ray 

## 2014-11-16 NOTE — ED Notes (Signed)
Pt reports onset 12noon mid chest pain, constant, worsening and pain when taking deep breaths.

## 2014-11-17 ENCOUNTER — Observation Stay (HOSPITAL_COMMUNITY): Payer: Commercial Managed Care - HMO

## 2014-11-17 ENCOUNTER — Encounter (HOSPITAL_COMMUNITY): Payer: Self-pay | Admitting: Radiology

## 2014-11-17 ENCOUNTER — Observation Stay (HOSPITAL_BASED_OUTPATIENT_CLINIC_OR_DEPARTMENT_OTHER): Payer: Commercial Managed Care - HMO

## 2014-11-17 DIAGNOSIS — E118 Type 2 diabetes mellitus with unspecified complications: Secondary | ICD-10-CM

## 2014-11-17 DIAGNOSIS — G8929 Other chronic pain: Secondary | ICD-10-CM | POA: Diagnosis not present

## 2014-11-17 DIAGNOSIS — K8020 Calculus of gallbladder without cholecystitis without obstruction: Secondary | ICD-10-CM | POA: Diagnosis not present

## 2014-11-17 DIAGNOSIS — R079 Chest pain, unspecified: Principal | ICD-10-CM

## 2014-11-17 DIAGNOSIS — G43909 Migraine, unspecified, not intractable, without status migrainosus: Secondary | ICD-10-CM | POA: Diagnosis not present

## 2014-11-17 DIAGNOSIS — I10 Essential (primary) hypertension: Secondary | ICD-10-CM | POA: Diagnosis not present

## 2014-11-17 DIAGNOSIS — E039 Hypothyroidism, unspecified: Secondary | ICD-10-CM

## 2014-11-17 DIAGNOSIS — K429 Umbilical hernia without obstruction or gangrene: Secondary | ICD-10-CM | POA: Diagnosis not present

## 2014-11-17 DIAGNOSIS — N200 Calculus of kidney: Secondary | ICD-10-CM | POA: Diagnosis not present

## 2014-11-17 DIAGNOSIS — M159 Polyosteoarthritis, unspecified: Secondary | ICD-10-CM | POA: Diagnosis not present

## 2014-11-17 DIAGNOSIS — I209 Angina pectoris, unspecified: Secondary | ICD-10-CM | POA: Diagnosis not present

## 2014-11-17 DIAGNOSIS — R1013 Epigastric pain: Secondary | ICD-10-CM

## 2014-11-17 DIAGNOSIS — G629 Polyneuropathy, unspecified: Secondary | ICD-10-CM

## 2014-11-17 DIAGNOSIS — E119 Type 2 diabetes mellitus without complications: Secondary | ICD-10-CM | POA: Diagnosis not present

## 2014-11-17 DIAGNOSIS — K219 Gastro-esophageal reflux disease without esophagitis: Secondary | ICD-10-CM | POA: Diagnosis not present

## 2014-11-17 DIAGNOSIS — I5032 Chronic diastolic (congestive) heart failure: Secondary | ICD-10-CM

## 2014-11-17 LAB — CBG MONITORING, ED
GLUCOSE-CAPILLARY: 169 mg/dL — AB (ref 65–99)
GLUCOSE-CAPILLARY: 120 mg/dL — AB (ref 65–99)
Glucose-Capillary: 100 mg/dL — ABNORMAL HIGH (ref 65–99)

## 2014-11-17 LAB — COMPREHENSIVE METABOLIC PANEL
ALBUMIN: 3.4 g/dL — AB (ref 3.5–5.0)
ALT: 15 U/L (ref 14–54)
ANION GAP: 10 (ref 5–15)
AST: 19 U/L (ref 15–41)
Alkaline Phosphatase: 56 U/L (ref 38–126)
BILIRUBIN TOTAL: 0.4 mg/dL (ref 0.3–1.2)
BUN: 11 mg/dL (ref 6–20)
CALCIUM: 9.1 mg/dL (ref 8.9–10.3)
CO2: 23 mmol/L (ref 22–32)
CREATININE: 0.75 mg/dL (ref 0.44–1.00)
Chloride: 105 mmol/L (ref 101–111)
GFR calc Af Amer: >60 mL/min (ref >60)
Glucose, Bld: 167 mg/dL — ABNORMAL HIGH (ref 65–99)
Potassium: 3.8 mmol/L (ref 3.5–5.1)
Sodium: 138 mmol/L (ref 135–145)
Total Protein: 6.7 g/dL (ref 6.5–8.1)

## 2014-11-17 LAB — CBC WITH DIFFERENTIAL/PLATELET
BASOS PCT: 1 % (ref 0–1)
Basophils Absolute: 0 10*3/uL (ref 0.0–0.1)
Eosinophils Absolute: 0.1 10*3/uL (ref 0.0–0.7)
Eosinophils Relative: 2 % (ref 0–5)
HCT: 39 % (ref 36.0–46.0)
HEMOGLOBIN: 12.1 g/dL (ref 12.0–15.0)
LYMPHS PCT: 31 % (ref 12–46)
Lymphs Abs: 2.5 10*3/uL (ref 0.7–4.0)
MCH: 26.9 pg (ref 26.0–34.0)
MCHC: 31 g/dL (ref 30.0–36.0)
MCV: 86.9 fL (ref 78.0–100.0)
Monocytes Absolute: 0.8 10*3/uL (ref 0.1–1.0)
Monocytes Relative: 9 % (ref 3–12)
Neutro Abs: 4.6 10*3/uL (ref 1.7–7.7)
Neutrophils Relative %: 57 % (ref 43–77)
PLATELETS: 254 10*3/uL (ref 150–400)
RBC: 4.49 MIL/uL (ref 3.87–5.11)
RDW: 15.2 % (ref 11.5–15.5)
WBC: 8.1 10*3/uL (ref 4.0–10.5)

## 2014-11-17 LAB — MRSA PCR SCREENING: MRSA by PCR: NEGATIVE

## 2014-11-17 LAB — LIPASE, BLOOD: Lipase: 21 U/L — ABNORMAL LOW (ref 22–51)

## 2014-11-17 LAB — TROPONIN I
Troponin I: <0.03 ng/mL (ref <0.031)
Troponin I: <0.03 ng/mL (ref <0.031)
Troponin I: <0.03 ng/mL (ref <0.031)

## 2014-11-17 LAB — GLUCOSE, CAPILLARY
GLUCOSE-CAPILLARY: 132 mg/dL — AB (ref 65–99)
Glucose-Capillary: 148 mg/dL — ABNORMAL HIGH (ref 65–99)

## 2014-11-17 LAB — PROTIME-INR
INR: 1.09 (ref 0.00–1.49)
PROTHROMBIN TIME: 14.3 s (ref 11.6–15.2)

## 2014-11-17 LAB — BRAIN NATRIURETIC PEPTIDE: B Natriuretic Peptide: 39.7 pg/mL (ref 0.0–100.0)

## 2014-11-17 MED ORDER — IOHEXOL 300 MG/ML  SOLN
25.0000 mL | INTRAMUSCULAR | Status: AC
  Administered 2014-11-17 (×2): 25 mL via ORAL

## 2014-11-17 MED ORDER — ONDANSETRON HCL 4 MG/2ML IJ SOLN: 4.0000 mg | Freq: Four times a day (QID) | INTRAMUSCULAR | Status: DC | PRN

## 2014-11-17 MED ORDER — INSULIN GLARGINE 100 UNIT/ML ~~LOC~~ SOLN
45.0000 [IU] | Freq: Two times a day (BID) | SUBCUTANEOUS | Status: DC
  Administered 2014-11-17 – 2014-11-18 (×3): 45 [IU] via SUBCUTANEOUS
  Filled 2014-11-17 (×7): qty 0.45

## 2014-11-17 MED ORDER — LOSARTAN POTASSIUM 50 MG PO TABS
50.0000 mg | ORAL_TABLET | Freq: Every day | ORAL | Status: DC
  Administered 2014-11-17 – 2014-11-18 (×2): 50 mg via ORAL
  Filled 2014-11-17 (×2): qty 1

## 2014-11-17 MED ORDER — ACETAMINOPHEN 325 MG PO TABS
650.0000 mg | ORAL_TABLET | ORAL | Status: DC | PRN
Start: 2014-11-17 — End: 2014-11-18

## 2014-11-17 MED ORDER — ENOXAPARIN SODIUM 40 MG/0.4ML ~~LOC~~ SOLN
40.0000 mg | SUBCUTANEOUS | Status: DC
Start: 2014-11-17 — End: 2014-11-18
  Administered 2014-11-17 – 2014-11-18 (×2): 40 mg via SUBCUTANEOUS
  Filled 2014-11-17 (×2): qty 0.4

## 2014-11-17 MED ORDER — CYCLOBENZAPRINE HCL 10 MG PO TABS
10.0000 mg | ORAL_TABLET | Freq: Three times a day (TID) | ORAL | Status: DC | PRN
  Administered 2014-11-17 – 2014-11-18 (×3): 10 mg via ORAL
  Filled 2014-11-17 (×3): qty 1

## 2014-11-17 MED ORDER — HYDROCHLOROTHIAZIDE 12.5 MG PO CAPS
12.5000 mg | ORAL_CAPSULE | Freq: Every day | ORAL | Status: DC
  Administered 2014-11-17 – 2014-11-18 (×2): 12.5 mg via ORAL
  Filled 2014-11-17 (×2): qty 1

## 2014-11-17 MED ORDER — IOHEXOL 300 MG/ML  SOLN: 100.0000 mL | Freq: Once | INTRAMUSCULAR | Status: DC | PRN

## 2014-11-17 MED ORDER — INSULIN ASPART 100 UNIT/ML ~~LOC~~ SOLN
0.0000 [IU] | Freq: Four times a day (QID) | SUBCUTANEOUS | Status: DC
  Administered 2014-11-17: 3 [IU] via SUBCUTANEOUS
  Filled 2014-11-17: qty 1

## 2014-11-17 MED ORDER — LEVOTHYROXINE SODIUM 100 MCG PO TABS
100.0000 ug | ORAL_TABLET | Freq: Every day | ORAL | Status: DC
Start: 2014-11-17 — End: 2014-11-18
  Administered 2014-11-17 – 2014-11-18 (×2): 100 ug via ORAL
  Filled 2014-11-17 (×3): qty 1

## 2014-11-17 MED ORDER — HYDROMORPHONE HCL 1 MG/ML IJ SOLN: 0.5000 mg | INTRAMUSCULAR | Status: DC | PRN

## 2014-11-17 MED ORDER — LOSARTAN POTASSIUM-HCTZ 50-12.5 MG PO TABS: 1.0000 | ORAL_TABLET | Freq: Every day | ORAL | Status: DC

## 2014-11-17 MED ORDER — KETOROLAC TROMETHAMINE 30 MG/ML IJ SOLN
30.0000 mg | Freq: Four times a day (QID) | INTRAMUSCULAR | Status: DC | PRN
  Administered 2014-11-17 – 2014-11-18 (×4): 30 mg via INTRAVENOUS
  Filled 2014-11-17 (×4): qty 1

## 2014-11-17 MED ORDER — GABAPENTIN 300 MG PO CAPS
300.0000 mg | ORAL_CAPSULE | Freq: Three times a day (TID) | ORAL | Status: DC
  Administered 2014-11-17 – 2014-11-18 (×4): 300 mg via ORAL
  Filled 2014-11-17 (×4): qty 1

## 2014-11-17 MED ORDER — ASPIRIN EC 81 MG PO TBEC
81.0000 mg | DELAYED_RELEASE_TABLET | Freq: Every day | ORAL | Status: DC
  Administered 2014-11-17 – 2014-11-18 (×2): 81 mg via ORAL
  Filled 2014-11-17 (×2): qty 1

## 2014-11-17 NOTE — ED Notes (Signed)
Called pharmacy.  Lantus has not arrived.  ECHO at bedside.

## 2014-11-17 NOTE — Progress Notes (Signed)
Echocardiogram 2D Echocardiogram has been performed.  Tresa Res 11/17/2014, 12:42 PM

## 2014-11-17 NOTE — ED Notes (Signed)
Patient CBG was 100 Nurse was informed.

## 2014-11-17 NOTE — H&P (Signed)
[facility] History and Physical  [name]  MRN: [id number]  DOB: 03/18/1951  DOS: the patient was seen and examined on 11/17/2014 PCP: Desiree Fraction, MD  Referring physician: Dr. Verta Pratt Chief Complaint: Chest pain  HPI: DEMII Pratt is a 64 y.o. female with Past medical history of GERD, hypothyroidism, peripheral neuropathy, cardiomyopathy, diabetes mellitus type 2, essential hypertension, chronic diastolic dysfunction. The patient is presenting with complaints of chest pain. The pain is located substernally and feels like sharp and stabbing pain. She also initially had some heaviness. This is associated with shortness of breath and nausea and vomiting as well as diaphoresis. She denies any fever or chills denies any cough denies any diarrhea or constipation denies any burning urination. Denies any recent hospitalization or leg swelling or leg cramps. She mentions even slightest movement makes her pain worse. The pain also gets worse with taking a deep breath. The pain also gets worse with palpating on it. She mentions he had similar pain in the past which was felt to be muscle related but not as severe as current.  The patient is coming from home.  At her baseline ambulates without any support And is independent for most of her ADL manages her medication on her own.  Review of Systems: as mentioned in the history of present illness.  A comprehensive review of the other systems is negative.  Past Medical History  Diagnosis Date  . GERD (gastroesophageal reflux disease)   . Hypothyroidism   . Peripheral neuropathy   . Cardiomyopathy   . Chest pain 06/2005    Hospitalized, dystolic dysfunction  . Sciatica   . Type II diabetes mellitus   . PONV (postoperative nausea and vomiting)   . H/O hiatal hernia   . Migraines     "it's been a long time since I've had one" (03/20/2012)  . Osteoarthritis   . Arthritis     "all over; mostly in my back"  (03/20/2012)  . Chronic lower back pain     "have 2 herniated discs; going to have to have a fusion" (03/20/2012)  . Breast cancer     "right" (03/20/2012)  . Family history of anesthesia complication     "father has nausea and vomiting"  . Irritable bowel syndrome   . Hypertension     "patient states never had HTN, takes Hyzaar for heart  . History of right mastectomy   . Anginal pain 03/19/2012    saw Dr. Cathie Pratt .. she thinks its more related to stomach issues  . History of kidney stones    Past Surgical History  Procedure Laterality Date  . Abdominal hysterectomy  1993  . Total knee arthroplasty  2000    Right  . Mastectomy with axillary lymph node dissection  12/2003    "right" (03/20/2012)  . Thyroidectomy  1977    Right  . Posterior cervical fusion/foraminotomy  2001  . Shoulder arthroscopy w/ rotator cuff repair  2003    Left  . Cardiac catheterization  ? 2005 / 2007  . Adenosin cardiolite  07/2005    (+) wall motion abnormality  . Esophagogastroduodenoscopy  2005    GERD  . [place] of abdomen  07/2005    Fatty liver / kidney stones  . Ct abdomen and pelvis  02/2010    Same  . Knee arthroscopy  1990's    "right" (03/20/2012)  . Axillary lymph node dissection  2003    squamous cell cancer  . Back surgery    .  Lumbar laminectomy/decompression microdiscectomy N/A 02/04/2014    Procedure: RIGHT L2-3 MICRODISCECTOMY ;  Surgeon: Desiree Oto, MD;  Location: [facility];  Service: Orthopedics;  Laterality: N/A;  . Lumbar fusion N/A 08/22/2014    Procedure: Right L2-3 and right L1-2 transforaminal lumbar interbody fusion with pedicle screws, rods, sleeves, cages, local bone graft, Vivigen, cancellous chips;  Surgeon: Desiree Oto, MD;  Location: [facility];  Service: Orthopedics;  Laterality: N/A;   Social History:  reports that she has never smoked. She has never used smokeless tobacco. She reports that she does not drink alcohol or use illicit drugs.  Allergies  Allergen Reactions    . Dilaudid [[name]] Nausea And Vomiting  . Adhesive [Tape] Rash  . Ampicillin Rash    REACTION: Rash and welts on her hands when she took it in the hospital  . Penicillins Rash    Family History  Problem Relation Age of Onset  . Hypertension Mother   . Diabetes Father   . Hypertension Father   . Hypertension Sister   . Diabetes Other     Family Hx. of Diabetes  . Hypertension Other     Family History of HTN  . Hypertension Brother   . Hypertension Brother   . Diabetes Brother     Prior to Admission medications   Medication Sig Start Date End Date Taking? Authorizing Provider  alendronate (FOSAMAX) 70 MG tablet Take 70 mg by mouth every Monday. 11/04/14  Yes Historical Provider, MD  aspirin 81 MG tablet Take 81 mg by mouth daily.   Yes Historical Provider, MD  Cholecalciferol (VITAMIN D3) 5000 UNITS CAPS Take 5,000 Units by mouth 2 (two) times daily.   Yes Historical Provider, MD  Empagliflozin-Linagliptin 25-5 MG TABS Take 1 tablet by mouth daily. Patient taking differently: Take 1 tablet by mouth daily. Glyxambi 09/16/14  Yes Desiree Rossetti, MD  gabapentin (NEURONTIN) 300 MG capsule Take 1 capsule (300 mg total) by mouth 3 (three) times daily. 01/09/14  Yes Desiree Frizzle, MD  HYDROcodone-acetaminophen ([place]) 7.5-325 MG per tablet TAKE 1 TO 2 TABLETS BY MOUTH EVERY 4 TO 6 HOURS AS NEEDED FOR PAIN MAXIMUM 8 TABLETS PER DAY 09/19/14  Yes Historical Provider, MD  insulin glargine (LANTUS) 100 unit/mL SOPN Inject 0.45 mLs (45 Units total) into the skin 2 (two) times daily. 04/24/14  Yes Desiree Frizzle, MD  levothyroxine (SYNTHROID, LEVOTHROID) 100 MCG tablet Take 1 tablet (100 mcg total) by mouth daily. 10/29/13  Yes Desiree Frizzle, MD  losartan-hydrochlorothiazide (HYZAAR) 50-12.5 MG per tablet Take 1 tablet by mouth daily. 10/29/13  Yes Desiree Frizzle, MD  metFORMIN (GLUCOPHAGE) 850 MG tablet Take 1 tablet (850 mg total) by mouth 2 (two) times daily with a meal.  08/25/14  Yes Desiree Oto, MD  Misc Natural Products (LAXATIVE FORMULA) TABS Take 1 tablet by mouth as needed (for BM).   Yes Historical Provider, MD  insulin aspart (NOVOLOG) 100 UNIT/ML FlexPen Inject 15 Units into the skin 3 (three) times daily with meals. Patient not taking: Reported on 11/16/2014 04/24/14   Desiree Frizzle, MD  Insulin Pen Needle (PEN NEEDLES 5/16") 30G X 8 MM MISC Use to inject insulin 5x daily. 12/31/13   Desiree Frizzle, MD  methocarbamol (ROBAXIN) 500 MG tablet Take 1 tablet (500 mg total) by mouth every 6 (six) hours as needed for muscle spasms. Patient not taking: Reported on 11/04/2014 02/05/14   Desiree Oto, MD  naloxegol oxalate (  MOVANTIK) 25 MG TABS tablet Take 1 tablet (25 mg total) by mouth daily. 09/16/14   Desiree Rossetti, MD  ondansetron (ZOFRAN) 4 MG tablet Take 1 tablet (4 mg total) by mouth every 6 (six) hours. 09/14/14   Ezequiel Essex, MD  promethazine (PHENERGAN) 25 MG tablet Take 1 tablet (25 mg total) by mouth every 8 (eight) hours as needed for nausea or vomiting. 08/25/14   Desiree Oto, MD    Physical Exam: Filed Vitals:   11/17/14 0145 11/17/14 0230 11/17/14 0300 11/17/14 0330  BP: 150/66 130/52 127/66 122/48  Pulse: 66 66 64 60  Temp:      TempSrc:      Resp: 22 20 18 18   Height:      Weight:      SpO2: 99% 99% 98% 99%    General: Alert, Awake and Oriented to Time, Place and Person. Appear in marked distress Eyes: PERRL ENT: Oral Mucosa clear moist. Neck: no JVD Cardiovascular: S1 and S2 Present, no Murmur, Peripheral Pulses Present Respiratory: Bilateral Air entry equal and Decreased,  Clear to Auscultation, no Crackles, no wheezes Abdomen: Bowel Sound present, Soft and non tender Skin: no Rash Extremities: no Pedal edema, no calf tenderness Neurologic: Grossly no focal neuro deficit.  Labs on Admission:  CBC:  Recent Labs Lab 11/16/14 1929 11/17/14 0317  WBC 7.6 8.1  NEUTROABS  --  4.6  HGB 13.4 12.1  HCT 42.7 39.0  MCV  86.4 86.9  PLT 278 254    CMP     Component Value Date/Time   NA 138 11/17/2014 0317   NA 135 08/14/2009 1400   K 3.8 11/17/2014 0317   K 4.1 08/14/2009 1400   CL 105 11/17/2014 0317   CL 99 08/14/2009 1400   CO2 23 11/17/2014 0317   CO2 27 08/14/2009 1400   GLUCOSE 167* 11/17/2014 0317   GLUCOSE 235* 08/14/2009 1400   BUN 11 11/17/2014 0317   BUN 11 08/14/2009 1400   CREATININE 0.75 11/17/2014 0317   CREATININE 0.58 07/28/2014 1030   CALCIUM 9.1 11/17/2014 0317   CALCIUM 10.2 08/14/2009 1400   PROT 6.7 11/17/2014 0317   PROT 7.1 08/14/2009 1400   ALBUMIN 3.4* 11/17/2014 0317   AST 19 11/17/2014 0317   AST 32 08/14/2009 1400   ALT 15 11/17/2014 0317   ALT 32 08/14/2009 1400   ALKPHOS 56 11/17/2014 0317   ALKPHOS 65 08/14/2009 1400   BILITOT 0.4 11/17/2014 0317   BILITOT 0.70 08/14/2009 1400   GFRNONAA >60 11/17/2014 0317   GFRNONAA >89 07/28/2014 1030   GFRAA >60 11/17/2014 0317   GFRAA >89 07/28/2014 1030    No results for input(s): LIPASE, AMYLASE in the last 168 hours.   Recent Labs Lab 11/17/14 0317  TROPONINI <0.03   BNP (last 3 results) No results for input(s): BNP in the last 8760 hours.  ProBNP (last 3 results) No results for input(s): PROBNP in the last 8760 hours.   Radiological Exams on Admission: Dg Chest 2 View  11/16/2014   CLINICAL DATA:  Patient with shortness of breath and chest pain for 1 day.  EXAM: CHEST  2 VIEW  COMPARISON:  Chest radiograph 08/22/2014  FINDINGS: Anterior cervical spinal fusion hardware. Stable cardiac and mediastinal contours. Tortuosity of the thoracic aorta. Elevation right hemidiaphragm. Right basilar atelectasis. No pulmonary consolidation. No pleural effusion or pneumothorax. Thoracic spine degenerative changes.  IMPRESSION: No acute cardiopulmonary process.   Electronically Signed   By: Dian Situ  Rosana Hoes M.D.   On: 11/16/2014 20:14   Ct Angio Chest Pe W/cm &/or Wo Cm  11/17/2014   CLINICAL DATA:  Acute onset of  generalized chest pain. Initial encounter.  EXAM: CT ANGIOGRAPHY CHEST WITH CONTRAST  TECHNIQUE: Multidetector CT imaging of the chest was performed using the standard protocol during bolus administration of intravenous contrast. Multiplanar CT image reconstructions and MIPs were obtained to evaluate the vascular anatomy.  CONTRAST:  20mL OMNIPAQUE IOHEXOL 350 MG/ML SOLN  COMPARISON:  Chest radiograph performed earlier today at 8:04 p.m.  FINDINGS: There is no evidence of pulmonary embolus.  Mild bibasilar atelectasis is noted. Slight interstitial prominence is seen, nonspecific in appearance. No pleural effusion or pneumothorax is identified. No masses are identified; no abnormal focal contrast enhancement is seen.  The mediastinum is unremarkable in appearance. No mediastinal lymphadenopathy is seen. No pericardial effusion is identified. The great vessels are grossly unremarkable in appearance. No axillary lymphadenopathy is seen. There is absence of the right thyroid lobe.  The patient is status post right-sided mastectomy. Postoperative change is noted about the right axilla.  The visualized portions of the liver and spleen are unremarkable.  No acute osseous abnormalities are seen. Anterior bridging osteophytes are seen along the upper thoracic spine.  Review of the MIP images confirms the above findings.  IMPRESSION: 1. No evidence of pulmonary embolus. 2. Mild bibasilar atelectasis noted. Slight interstitial prominence is nonspecific. Lungs otherwise clear.   Electronically Signed   By: Garald Balding M.D.   On: 11/17/2014 00:28   EKG: Independently reviewed. normal sinus rhythm, nonspecific ST and T waves changes.  Assessment/Plan Principal Problem:   Chest pain Active Problems:   Hypothyroidism   Peripheral neuropathy   Essential hypertension   GERD   Type 2 diabetes mellitus   1. Chest pain The patient is presenting with complaints of chest pain. The pain appears to be musculoskeletal in  nature although ACS cannot be ruled out. We will monitor serial troponins for the patient echo program in the morning. CT scan is negative for any acute abnormality. I will use Toradol as well as Dilaudid as needed as well as Flexeril for pain control.  2. Hypothyroidism. Continue home Synthroid.  3. Diabetes mellitus type 2. Placing her on sliding scale insulin.  4. Essential hypertension. Continuing home medications.  5. Peripheral neuropathy. Continue gabapentin.  Advance goals of care discussion: Full code   DVT Prophylaxis: subcutaneous Heparin Nutrition: Nothing by mouth except medications  Family Communication: family was present at bedside, opportunity was given to ask question and all questions were answered satisfactorily at the time of interview. Disposition: Admitted as observation, step-down unit.  Author: Berle Mull, MD [facility] Pager: [phone] 11/17/2014  If 7PM-7AM, please contact night-coverage [email or web address] Password [email or web address]

## 2014-11-17 NOTE — ED Notes (Signed)
Patel, MD at bedside.  

## 2014-11-17 NOTE — Progress Notes (Signed)
   Follow Up Note  Pt admitted earlier this morning.  Seen in the emergency room while awaiting floor transfer. Complains of ongoing continuous times several days sharp midepigastric pain with radiation to her back. No associated nausea or vomiting. No cc shortness of breath.  Exam: CV: Regular rate and rhythm, S1-S2 Lungs: Clear auscultation bilaterally although inspiration causes some pain Abd: Midepigastric focal tenderness with deep palpation Ext: Trace pitting edema  Present on Admission:  . pain: Initially described as chest, although I think that this is more midepigastric. Would be certainly consistent with a pancreatitis although lipase level normal and patient has no previous history of pancreatitis. CT scan of chest ruled out pulmonary embolus. Cycling troponins. We'll go ahead and check abdominal CT and have advanced diet. Echocardiogram unrevealing  . Hypothyroidism: Continue Synthroid  . Essential hypertension: Blood pressure stable  . GERD: Continue PPI  . Chronic diastolic heart failure: Euvolemic Await CT results. Anticipate possible discharge tomorrow

## 2014-11-17 NOTE — ED Notes (Signed)
Called Echo lab to see when pt would go for her echo.  They stated they would get to her as soon as they could.

## 2014-11-18 DIAGNOSIS — I10 Essential (primary) hypertension: Secondary | ICD-10-CM | POA: Diagnosis not present

## 2014-11-18 DIAGNOSIS — I5032 Chronic diastolic (congestive) heart failure: Secondary | ICD-10-CM | POA: Diagnosis not present

## 2014-11-18 DIAGNOSIS — R079 Chest pain, unspecified: Secondary | ICD-10-CM | POA: Diagnosis not present

## 2014-11-18 DIAGNOSIS — R1013 Epigastric pain: Secondary | ICD-10-CM | POA: Diagnosis not present

## 2014-11-18 DIAGNOSIS — M940 Chondrocostal junction syndrome [Tietze]: Secondary | ICD-10-CM

## 2014-11-18 DIAGNOSIS — K219 Gastro-esophageal reflux disease without esophagitis: Secondary | ICD-10-CM

## 2014-11-18 DIAGNOSIS — I209 Angina pectoris, unspecified: Secondary | ICD-10-CM | POA: Diagnosis not present

## 2014-11-18 DIAGNOSIS — G8929 Other chronic pain: Secondary | ICD-10-CM | POA: Diagnosis not present

## 2014-11-18 DIAGNOSIS — E039 Hypothyroidism, unspecified: Secondary | ICD-10-CM | POA: Diagnosis not present

## 2014-11-18 DIAGNOSIS — G43909 Migraine, unspecified, not intractable, without status migrainosus: Secondary | ICD-10-CM | POA: Diagnosis not present

## 2014-11-18 DIAGNOSIS — E119 Type 2 diabetes mellitus without complications: Secondary | ICD-10-CM | POA: Diagnosis not present

## 2014-11-18 DIAGNOSIS — M159 Polyosteoarthritis, unspecified: Secondary | ICD-10-CM | POA: Diagnosis not present

## 2014-11-18 LAB — HEMOGLOBIN A1C
Hgb A1c MFr Bld: 8.4 % — ABNORMAL HIGH (ref 4.8–5.6)
MEAN PLASMA GLUCOSE: 194 mg/dL

## 2014-11-18 LAB — CBC
HCT: 40.6 % (ref 36.0–46.0)
HEMOGLOBIN: 12.9 g/dL (ref 12.0–15.0)
MCH: 27.5 pg (ref 26.0–34.0)
MCHC: 31.8 g/dL (ref 30.0–36.0)
MCV: 86.6 fL (ref 78.0–100.0)
Platelets: 251 10*3/uL (ref 150–400)
RBC: 4.69 MIL/uL (ref 3.87–5.11)
RDW: 15.3 % (ref 11.5–15.5)
WBC: 6.5 10*3/uL (ref 4.0–10.5)

## 2014-11-18 LAB — COMPREHENSIVE METABOLIC PANEL
ALT: 15 U/L (ref 14–54)
ANION GAP: 8 (ref 5–15)
AST: 21 U/L (ref 15–41)
Albumin: 3.2 g/dL — ABNORMAL LOW (ref 3.5–5.0)
Alkaline Phosphatase: 53 U/L (ref 38–126)
BILIRUBIN TOTAL: 0.6 mg/dL (ref 0.3–1.2)
BUN: 16 mg/dL (ref 6–20)
CALCIUM: 9.2 mg/dL (ref 8.9–10.3)
CO2: 24 mmol/L (ref 22–32)
CREATININE: 0.88 mg/dL (ref 0.44–1.00)
Chloride: 106 mmol/L (ref 101–111)
GFR calc Af Amer: >60 mL/min (ref >60)
GFR calc non Af Amer: >60 mL/min (ref >60)
GLUCOSE: 154 mg/dL — AB (ref 65–99)
POTASSIUM: 4.1 mmol/L (ref 3.5–5.1)
Sodium: 138 mmol/L (ref 135–145)
TOTAL PROTEIN: 6.6 g/dL (ref 6.5–8.1)

## 2014-11-18 LAB — GLUCOSE, CAPILLARY
Glucose-Capillary: 135 mg/dL — ABNORMAL HIGH (ref 65–99)
Glucose-Capillary: 146 mg/dL — ABNORMAL HIGH (ref 65–99)

## 2014-11-18 MED ORDER — IBUPROFEN 800 MG PO TABS: ORAL_TABLET | ORAL | Status: DC

## 2014-11-18 MED ORDER — TIZANIDINE HCL 2 MG PO TABS
2.0000 mg | ORAL_TABLET | Freq: Four times a day (QID) | ORAL | Status: DC | PRN
Start: 2014-11-18 — End: 2015-05-27

## 2014-11-18 NOTE — Discharge Instructions (Addendum)
Costochondritis Costochondritis is a condition in which the tissue (cartilage) that connects your ribs with your breastbone (sternum) becomes irritated. It causes pain in the chest and rib area. It usually goes away on its own over time. HOME CARE  Avoid activities that wear you out.  Do not strain your ribs. Avoid activities that use your:  Chest.  Belly.  Side muscles.  Put ice on the area for the first 2 days after the pain starts.  Put ice in a plastic bag.  Place a towel between your skin and the bag.  Leave the ice on for 20 minutes, 2-3 times a day.  Only take medicine as told by your doctor. GET HELP IF:  You have redness or puffiness (swelling) in the rib area.  Your pain does not go away with rest or medicine. GET HELP RIGHT AWAY IF:   Your pain gets worse.  You are very uncomfortable.  You have trouble breathing.   You cough up blood.  You start sweating or throwing up (vomiting).  You have a fever or lasting symptoms for more than 2-3 days.  You have a fever and your symptoms suddenly get worse. MAKE SURE YOU:   Understand these instructions.  Will watch your condition.  Will get help right away if you are not doing well or get worse. Document Released: 09/14/2007 Document Revised: 11/28/2012 Document Reviewed: 10/30/2012 [facility] Patient Information 2015 [place], [place]. This information is not intended to replace advice given to you by your health care provider. Make sure you discuss any questions you have with your health care provider.    Chest Wall Pain Chest wall pain is pain felt in or around the chest bones and muscles. It may take up to 6 weeks to get better. It may take longer if you are active. Chest wall pain can happen on its own. Other times, things like germs, injury, coughing, or exercise can cause the pain. HOME CARE   Avoid activities that make you tired or cause pain. Try not to use your chest, belly (abdominal), or side  muscles. Do not use heavy weights.  Put ice on the sore area.  Put ice in a plastic bag.  Place a towel between your skin and the bag.  Leave the ice on for 15-20 minutes for the first 2 days.  Only take medicine as told by your doctor. GET HELP RIGHT AWAY IF:   You have more pain or are very uncomfortable.  You have a fever.  Your chest pain gets worse.  You have new problems.  You feel sick to your stomach (nauseous) or throw up (vomit).  You start to sweat or feel lightheaded.  You have a cough with mucus (phlegm).  You cough up blood. MAKE SURE YOU:   Understand these instructions.  Will watch your condition.  Will get help right away if you are not doing well or get worse. Document Released: 09/14/2007 Document Revised: 06/20/2011 Document Reviewed: 11/22/2010 [facility] Patient Information 2015 [place], [place]. This information is not intended to replace advice given to you by your health care provider. Make sure you discuss any questions you have with your health care provider.

## 2014-11-18 NOTE — Discharge Summary (Addendum)
Discharge Summary  [name] [id number] DOB: [birth year]  PCP: Odette Fraction, MD  Admit date: 11/16/2014 Discharge date: 11/18/2014  Time spent: 25 minutes  Recommendations for Outpatient Follow-up:  1. New medication: Motrin 800 mg by mouth 3 times a day 2 days 2. Medication: Zanaflex 2 mg by mouth 3 times a day when necessary 3. Patient will follow-up with her PCP in the next few weeks  Discharge Diagnoses:  Active Hospital Problems   Diagnosis Date Noted  . Chest pain 03/20/2012  . Type 2 diabetes mellitus 11/17/2014  . Chronic diastolic heart failure [id number]  . Midepigastric pain   . Essential hypertension 08/05/2008  . Peripheral neuropathy 08/23/2006  . Hypothyroidism 08/23/2006  . GERD 08/23/2006    Resolved Hospital Problems   Diagnosis Date Noted Date Resolved  No resolved problems to display.    Discharge Condition: Improved, being discharged home  Diet recommendation: Low-sodium  Filed Weights   11/16/14 1919 11/18/14 0400  Weight: 86.909 kg (191 lb 9.6 oz) 85.866 kg (189 lb 4.8 oz)    History of present illness:  64 year old female with past medical history of hypothyroidism, hypertension and diastolic heart failure admitted for complaints of chest pain. Chest pain described as mid epigastric to mid sternal radiation to her back. CT scan of chest ruled out dissection and pulmonary embolus.  Hospital Course:  Principal Problem:   Chest pain/midepigastric pain felt to be costochondritis: Lipase level checked found be normal. CT of abdomen and pelvis unrevealing. Enzymes 3 negative. Echocardiogram unrevealing. Suspect this may be more costochondritis given focal point tenderness and pain in this isolated midsternal/midepigastric area with deep inspiration or movement. By hospital day 2, patient feeling somewhat better. Patient had some relief with Toradol and muscle relaxers. Discharged home on when necessary muscle asked her +2 days of  scheduled high-dose ibuprofen Active Problems:   Hypothyroidism: Continue Synthroid   Peripheral neuropathy: Continue Neurontin   Essential hypertension   GERD   Type 2 diabetes mellitus   Chronic diastolic heart failure: Euvolemic    Procedures:  Echocardiogram done 8/8: Grade 1 diastolic dysfunction  Consultations:  None  Discharge Exam: BP 132/58 mmHg  Pulse 63  Temp(Src) 98.7 F (37.1 C) (Oral)  Resp 16  Ht 5\' 5"  (1.651 m)  Wt 85.866 kg (189 lb 4.8 oz)  BMI 31.50 kg/m2  SpO2 98%  General: Alert and oriented 3, no acute distress Cardiovascular: Regular rate and rhythm, S1-S2 Respiratory: Clear to auscultation bilaterally  Discharge Instructions You were cared for by a hospitalist during your hospital stay. If you have any questions about your discharge medications or the care you received while you were in the hospital after you are discharged, you can call the unit and asked to speak with the hospitalist on call if the hospitalist that took care of you is not available. Once you are discharged, your primary care physician will handle any further medical issues. Please note that NO REFILLS for any discharge medications will be authorized once you are discharged, as it is imperative that you return to your primary care physician (or establish a relationship with a primary care physician if you do not have one) for your aftercare needs so that they can reassess your need for medications and monitor your lab values.  Discharge Instructions    Diet - low sodium heart healthy    Complete by:  As directed      Increase activity slowly    Complete by:  As directed  Medication List    STOP taking these medications        methocarbamol 500 MG tablet  Commonly known as:  ROBAXIN  Replaced by:  tiZANidine 2 MG tablet      TAKE these medications        alendronate 70 MG tablet  Commonly known as:  FOSAMAX  Take 70 mg by mouth every Monday.     aspirin  81 MG tablet  Take 81 mg by mouth daily.     Empagliflozin-Linagliptin 25-5 MG Tabs  Take 1 tablet by mouth daily.     gabapentin 300 MG capsule  Commonly known as:  NEURONTIN  Take 1 capsule (300 mg total) by mouth 3 (three) times daily.     HYDROcodone-acetaminophen 7.5-325 MG per tablet  Commonly known as:  NORCO  TAKE 1 TO 2 TABLETS BY MOUTH EVERY 4 TO 6 HOURS AS NEEDED FOR PAIN MAXIMUM 8 TABLETS PER DAY     ibuprofen 800 MG tablet  Commonly known as:  ADVIL,MOTRIN  Take 800 mg every 8 hours for 2 days.     insulin aspart 100 UNIT/ML FlexPen  Commonly known as:  NOVOLOG  Inject 15 Units into the skin 3 (three) times daily with meals.     insulin glargine 100 unit/mL Sopn  Commonly known as:  LANTUS  Inject 0.45 mLs (45 Units total) into the skin 2 (two) times daily.     LAXATIVE FORMULA Tabs  Take 1 tablet by mouth as needed (for BM).     levothyroxine 100 MCG tablet  Commonly known as:  SYNTHROID, LEVOTHROID  Take 1 tablet (100 mcg total) by mouth daily.     losartan-hydrochlorothiazide 50-12.5 MG per tablet  Commonly known as:  HYZAAR  Take 1 tablet by mouth daily.     metFORMIN 850 MG tablet  Commonly known as:  GLUCOPHAGE  Take 1 tablet (850 mg total) by mouth 2 (two) times daily with a meal.     naloxegol oxalate 25 MG Tabs tablet  Commonly known as:  MOVANTIK  Take 1 tablet (25 mg total) by mouth daily.     ondansetron 4 MG tablet  Commonly known as:  ZOFRAN  Take 1 tablet (4 mg total) by mouth every 6 (six) hours.     Pen Needles 5/16" 30G X 8 MM Misc  Use to inject insulin 5x daily.     promethazine 25 MG tablet  Commonly known as:  PHENERGAN  Take 1 tablet (25 mg total) by mouth every 8 (eight) hours as needed for nausea or vomiting.     tiZANidine 2 MG tablet  Commonly known as:  ZANAFLEX  Take 1 tablet (2 mg total) by mouth every 6 (six) hours as needed.     Vitamin D3 5000 UNITS Caps  Take 5,000 Units by mouth 2 (two) times daily.        Allergies  Allergen Reactions  . Dilaudid [Hydromorphone Hcl] Nausea And Vomiting  . Adhesive [Tape] Rash  . Ampicillin Rash    REACTION: Rash and welts on her hands when she took it in the hospital  . Penicillins Rash       Follow-up Information    Follow up with Odette Fraction, MD.   Specialty:  Family Medicine   Contact information:   [address] [place] [address] [phone]        The results of significant diagnostics from this hospitalization (including imaging, microbiology, ancillary and laboratory) are  listed below for reference.    Significant Diagnostic Studies: Dg Chest 2 View  11/16/2014   CLINICAL DATA:  Patient with shortness of breath and chest pain for 1 day.  EXAM: CHEST  2 VIEW  COMPARISON:  Chest radiograph 08/22/2014  FINDINGS: Anterior cervical spinal fusion hardware. Stable cardiac and mediastinal contours. Tortuosity of the thoracic aorta. Elevation right hemidiaphragm. Right basilar atelectasis. No pulmonary consolidation. No pleural effusion or pneumothorax. Thoracic spine degenerative changes.  IMPRESSION: No acute cardiopulmonary process.   Electronically Signed   By: Lovey Newcomer M.D.   On: 11/16/2014 20:14   Ct Angio Chest Pe W/cm &/or Wo Cm  11/17/2014   CLINICAL DATA:  Acute onset of generalized chest pain. Initial encounter.  EXAM: CT ANGIOGRAPHY CHEST WITH CONTRAST  TECHNIQUE: Multidetector CT imaging of the chest was performed using the standard protocol during bolus administration of intravenous contrast. Multiplanar CT image reconstructions and MIPs were obtained to evaluate the vascular anatomy.  CONTRAST:  59mL OMNIPAQUE IOHEXOL 350 MG/ML SOLN  COMPARISON:  Chest radiograph performed earlier today at 8:04 p.m.  FINDINGS: There is no evidence of pulmonary embolus.  Mild bibasilar atelectasis is noted. Slight interstitial prominence is seen, nonspecific in appearance. No pleural effusion or pneumothorax is identified. No masses  are identified; no abnormal focal contrast enhancement is seen.  The mediastinum is unremarkable in appearance. No mediastinal lymphadenopathy is seen. No pericardial effusion is identified. The great vessels are grossly unremarkable in appearance. No axillary lymphadenopathy is seen. There is absence of the right thyroid lobe.  The patient is status post right-sided mastectomy. Postoperative change is noted about the right axilla.  The visualized portions of the liver and spleen are unremarkable.  No acute osseous abnormalities are seen. Anterior bridging osteophytes are seen along the upper thoracic spine.  Review of the MIP images confirms the above findings.  IMPRESSION: 1. No evidence of pulmonary embolus. 2. Mild bibasilar atelectasis noted. Slight interstitial prominence is nonspecific. Lungs otherwise clear.   Electronically Signed   By: Garald Balding M.D.   On: 11/17/2014 00:28   Ct Lumbar Spine Wo Contrast  10/21/2014   CLINICAL DATA:  Low back and bilateral leg pain (worse on the left) and right leg numbness. History of back surgery x3, most recently 2 months ago. History of right-sided breast cancer. Question L2 compression fracture.  EXAM: CT LUMBAR SPINE WITHOUT CONTRAST  TECHNIQUE: Multidetector CT imaging of the lumbar spine was performed without intravenous contrast administration. Multiplanar CT image reconstructions were also generated.  COMPARISON:  Abdominal pelvic CT 09/14/2014, intraoperative radiographs 08/22/2014 and lumbar MRI 06/02/2014.  FINDINGS: There is transitional lumbosacral anatomy. In keeping with prior cross-sectional imaging, the transitional lumbosacral segment is assigned S1. Labeling has varied on some other prior studies. Patient has undergone pedicle screw and interbody fusion from L2 through S1. There is a mild superior endplate compression deformity at L2 resulting in less than 20% loss of vertebral body height. Both L2 pedicle screws protrude through the superior  endplate of L2. The right L3 pedicle screw also protrudes through the superior endplate. The pre-existing lower hardware is unchanged and intact. Solid interbody fusion is present from L3-4 through L5-S1. No definite solid interbody fusion seen at L2-3.  No significant disc space findings at T12-L1 or L1-2.  L2-3: Status post right laminectomy and PLIF. No residual spinal stenosis or nerve root encroachment.  L3-4: Interval right laminectomy and discectomy with interbody fusion. There is adequate decompression of the spinal canal. No  significant foraminal compromise or nerve root encroachment.  L4-5: Stable remote postsurgical changes with laminectomy and solid interbody fusion. The spinal canal and neural foramina are well-decompressed.  L5-S1: Stable remote postsurgical changes with laminectomy and solid interbody fusion. Posterior osteophytes on the right are unchanged without resulting foraminal compromise.  S1-2: Vestigial disc space with bilateral facet hypertrophy. No spinal stenosis. Bilateral sacroiliac degenerative changes noted.  IMPRESSION: 1. Transitional lumbosacral anatomy. The numbering for this examination is in keeping with prior cross-sectional studies, assigning the transitional segment S1. Fusion has been performed from L2 through S1. 2. Mild superior endplate compression deformity at L2. Both L2 pedicle screws protrude through the superior endplate of L2. The right L3 pedicle screw also protrudes through the superior endplate. No solid interbody fusion identified at L2-3. 3. Interbody fusion appears solid from L3-4 through L5-S1. 4. No recurrent spinal stenosis or nerve root encroachment.   Electronically Signed   By: Richardean Sale M.D.   On: 10/21/2014 15:14   Ct Abdomen Pelvis W Contrast  11/17/2014   CLINICAL DATA:  64 year old female with sharp mid epigastric pain  EXAM: CT ABDOMEN AND PELVIS WITH CONTRAST  TECHNIQUE: Multidetector CT imaging of the abdomen and pelvis was performed  using the standard protocol following bolus administration of intravenous contrast.  CONTRAST:  80 mL Omnipaque 300 administered intravenously  COMPARISON:  Recent CT abdomen/ pelvis 09/14/2014  FINDINGS: Lower Chest: The lung bases are clear. Visualized cardiac structures are within normal limits for size. No pericardial effusion. Unremarkable visualized distal thoracic esophagus.  Abdomen: Unremarkable CT appearance of the stomach, duodenum, spleen, adrenal glands and pancreas. Normal hepatic contour and morphology. No discrete hepatic lesion. High attenuation material layers dependently within the gallbladder lumen consistent with sludge and small stones. No definite gallbladder wall thickening or pericholecystic fluid.  Punctate nonobstructing stone in the interpolar right kidney. 6 mm nonobstructing stone in the interpolar left kidney. No evidence of hydronephrosis involving either kidney. Parapelvic renal cysts in the lower pole of the left kidney.  There are a few small colonic diverticula but no evidence of active diverticulitis. Normal appendix in the right lower quadrant. Unremarkable terminal ileum. No focal bowel wall thickening or evidence of obstruction. No free fluid or suspicious adenopathy.  Pelvis: Surgical changes of prior hysterectomy. Small fat containing umbilical hernia and small bilateral fat containing inguinal hernias.  Bones/Soft Tissues: No acute fracture or aggressive appearing lytic or blastic osseous lesion. Multilevel lumbar laminectomies and posterior spinal fusion. Stable compression deformity of the superior endplate of L1.  Vascular: No significant atherosclerotic vascular disease, aneurysmal dilatation or acute abnormality.  IMPRESSION: 1. No acute abnormality in the abdomen or pelvis to explain the patient's clinical symptoms. 2. Sludge and small stones layer within the gallbladder lumen. No evidence of gallbladder wall thickening or pericholecystic fluid to suggest acute  cholecystitis. 3. Bilateral nonobstructing nephrolithiasis. 4. Small fat containing umbilical and bilateral inguinal hernias. 5. Multilevel degenerative disc disease status post extensive prior surgical intervention.   Electronically Signed   By: Jacqulynn Cadet M.D.   On: 11/17/2014 21:26    Microbiology: Recent Results (from the past 240 hour(s))  MRSA PCR Screening     Status: None   Collection Time: 11/17/14  4:06 PM  Result Value Ref Range Status   MRSA by PCR NEGATIVE NEGATIVE Final    Comment:        The GeneXpert MRSA Assay (FDA approved for NASAL specimens only), is one component of a comprehensive MRSA colonization surveillance  program. It is not intended to diagnose MRSA infection nor to guide or monitor treatment for MRSA infections.      Labs: Basic Metabolic Panel:  Recent Labs Lab 11/16/14 1929 11/17/14 0317 11/18/14 1105  NA 141 138 138  K 4.4 3.8 4.1  CL 105 105 106  CO2 26 23 24   GLUCOSE 242* 167* 154*  BUN 14 11 16   CREATININE 0.90 0.75 0.88  CALCIUM 9.9 9.1 9.2   Liver Function Tests:  Recent Labs Lab 11/17/14 0317 11/18/14 1105  AST 19 21  ALT 15 15  ALKPHOS 56 53  BILITOT 0.4 0.6  PROT 6.7 6.6  ALBUMIN 3.4* 3.2*    Recent Labs Lab 11/17/14 0720  LIPASE 21*   No results for input(s): AMMONIA in the last 168 hours. CBC:  Recent Labs Lab 11/16/14 1929 11/17/14 0317 11/18/14 0438  WBC 7.6 8.1 6.5  NEUTROABS  --  4.6  --   HGB 13.4 12.1 12.9  HCT 42.7 39.0 40.6  MCV 86.4 86.9 86.6  PLT 278 254 251   Cardiac Enzymes:  Recent Labs Lab 11/17/14 0317 11/17/14 0720 11/17/14 1520  TROPONINI <0.03 <0.03 <0.03   BNP: BNP (last 3 results)  Recent Labs  11/17/14 0840  BNP 39.7    ProBNP (last 3 results) No results for input(s): PROBNP in the last 8760 hours.  CBG:  Recent Labs Lab 11/17/14 1319 11/17/14 1640 11/17/14 2139 11/18/14 0728 11/18/14 1117  GLUCAP 100* 148* 132* 135* 146*        Signed:  Juddson Cobern K  [facility] 11/18/2014, 7:29 PM

## 2014-11-20 ENCOUNTER — Telehealth: Payer: Self-pay | Admitting: *Deleted

## 2014-11-20 NOTE — Telephone Encounter (Signed)
Received fax from [facility] on 11/20/14 pt has been discharged from hospital  Date of admit: 11/16/14  Date of discharge: 11/18/14  Level of Care:observation  Discharging facility:[facility]   Attending physician:Patel,Pranav,MD  UQ:[id number]  Discharge Dispostion: Discharged to home,self care  DX: R07.9-Chest pain,unspecified

## 2014-11-20 NOTE — Telephone Encounter (Signed)
Received fax from [facility] care management [facility] stating that pt has been admitted to acute care hospital on 11/17/14  Hospital: [facility] Date: 11/16/14  Dx: R07.9- chest pain,unspecified  Admitting physician: Diamantina [facility] Patel,MD  Pending authorization: [phone]

## 2014-11-28 DIAGNOSIS — M5416 Radiculopathy, lumbar region: Secondary | ICD-10-CM | POA: Diagnosis not present

## 2014-11-28 DIAGNOSIS — M7741 Metatarsalgia, right foot: Secondary | ICD-10-CM | POA: Diagnosis not present

## 2014-12-06 ENCOUNTER — Other Ambulatory Visit: Payer: Self-pay | Admitting: Family Medicine

## 2014-12-16 ENCOUNTER — Encounter: Payer: Self-pay | Admitting: Family Medicine

## 2014-12-19 ENCOUNTER — Ambulatory Visit (INDEPENDENT_AMBULATORY_CARE_PROVIDER_SITE_OTHER): Payer: Commercial Managed Care - HMO | Admitting: Family Medicine

## 2014-12-19 ENCOUNTER — Encounter: Payer: Self-pay | Admitting: Family Medicine

## 2014-12-19 VITALS — BP 136/82 | HR 88 | Temp 98.3°F | Resp 18 | Ht 65.0 in | Wt 191.0 lb

## 2014-12-19 DIAGNOSIS — N3281 Overactive bladder: Secondary | ICD-10-CM

## 2014-12-19 NOTE — Progress Notes (Signed)
Subjective:    Patient ID: [name], female    DOB: 1950-11-02, 64 y.o.   MRN: [id number]  HPI Patient is a noncompliant diabetic. However she assures me that her sugars have been running in the 150s recently.  However over the last 3 months, she reports polyuria. She reports a sudden urge to urinate. Patient states that she has to  Urinate every 1-2 hours. She denies any dysuria or hematuria. She reports urge incontinence on occasion where she will not have time to make it to the bathroom. Past Medical History  Diagnosis Date  . GERD (gastroesophageal reflux disease)   . Hypothyroidism   . Peripheral neuropathy   . Cardiomyopathy   . Chest pain 06/2005    Hospitalized, dystolic dysfunction  . Sciatica   . Type II diabetes mellitus   . PONV (postoperative nausea and vomiting)   . H/O hiatal hernia   . Migraines     "it's been a long time since I've had one" (03/20/2012)  . Osteoarthritis   . Arthritis     "all over; mostly in my back" (03/20/2012)  . Chronic lower back pain     "have 2 herniated discs; going to have to have a fusion" (03/20/2012)  . Breast cancer     "right" (03/20/2012)  . Family history of anesthesia complication     "father has nausea and vomiting"  . Irritable bowel syndrome   . Hypertension     "patient states never had HTN, takes Hyzaar for heart  . History of right mastectomy   . Anginal pain 03/19/2012    saw Dr. Cathie Olden .. she thinks its more related to stomach issues  . History of kidney stones    Past Surgical History  Procedure Laterality Date  . Abdominal hysterectomy  1993  . Total knee arthroplasty  2000    Right  . Mastectomy with axillary lymph node dissection  12/2003    "right" (03/20/2012)  . Thyroidectomy  1977    Right  . Posterior cervical fusion/foraminotomy  2001  . Shoulder arthroscopy w/ rotator cuff repair  2003    Left  . Cardiac catheterization  ? 2005 / 2007  . Adenosin cardiolite  07/2005    (+) wall motion  abnormality  . Esophagogastroduodenoscopy  2005    GERD  . [place] of abdomen  07/2005    Fatty liver / kidney stones  . Ct abdomen and pelvis  02/2010    Same  . Knee arthroscopy  1990's    "right" (03/20/2012)  . Axillary lymph node dissection  2003    squamous cell cancer  . Back surgery    . Lumbar laminectomy/decompression microdiscectomy N/A 02/04/2014    Procedure: RIGHT L2-3 MICRODISCECTOMY ;  Surgeon: Jessy Oto, MD;  Location: [facility];  Service: Orthopedics;  Laterality: N/A;  . Lumbar fusion N/A 08/22/2014    Procedure: Right L2-3 and right L1-2 transforaminal lumbar interbody fusion with pedicle screws, rods, sleeves, cages, local bone graft, Vivigen, cancellous chips;  Surgeon: Jessy Oto, MD;  Location: [facility];  Service: Orthopedics;  Laterality: N/A;   Current Outpatient Prescriptions on File Prior to Visit  Medication Sig Dispense Refill  . alendronate (FOSAMAX) 70 MG tablet Take 70 mg by mouth every Monday.  3  . aspirin 81 MG tablet Take 81 mg by mouth daily.    . Cholecalciferol (VITAMIN D3) 5000 UNITS CAPS Take 5,000 Units by mouth 2 (two) times daily.    [name] Kitchen  Empagliflozin-Linagliptin 25-5 MG TABS Take 1 tablet by mouth daily. (Patient taking differently: Take 1 tablet by mouth daily. Glyxambi) 30 tablet 3  . gabapentin (NEURONTIN) 300 MG capsule Take 1 capsule (300 mg total) by mouth 3 (three) times daily. 90 capsule 3  . HYDROcodone-acetaminophen (NORCO) 7.5-325 MG per tablet TAKE 1 TO 2 TABLETS BY MOUTH EVERY 4 TO 6 HOURS AS NEEDED FOR PAIN MAXIMUM 8 TABLETS PER DAY  0  . ibuprofen (ADVIL,MOTRIN) 800 MG tablet Take 800 mg every 8 hours for 2 days. 30 tablet 0  . insulin aspart (NOVOLOG) 100 UNIT/ML FlexPen Inject 15 Units into the skin 3 (three) times daily with meals. 5 pen 11  . insulin glargine (LANTUS) 100 unit/mL SOPN Inject 0.45 mLs (45 Units total) into the skin 2 (two) times daily. 15 mL 5  . Insulin Pen Needle (PEN NEEDLES 5/16") 30G X 8 MM MISC Use to inject  insulin 5x daily. 200 each 11  . levothyroxine (SYNTHROID, LEVOTHROID) 100 MCG tablet Take 1 tablet (100 mcg total) by mouth daily. 30 tablet 11  . losartan-hydrochlorothiazide (HYZAAR) 50-12.5 MG per tablet Take 1 tablet by mouth daily. 30 tablet 11  . metFORMIN (GLUCOPHAGE) 850 MG tablet TAKE 1 TABLET BY MOUTH TWICE A DAY WITH MEALS 60 tablet 2  . Misc Natural Products (LAXATIVE FORMULA) TABS Take 1 tablet by mouth as needed (for BM).    . naloxegol oxalate (MOVANTIK) 25 MG TABS tablet Take 1 tablet (25 mg total) by mouth daily. 9 tablet 0  . ondansetron (ZOFRAN) 4 MG tablet Take 1 tablet (4 mg total) by mouth every 6 (six) hours. 12 tablet 0  . promethazine (PHENERGAN) 25 MG tablet Take 1 tablet (25 mg total) by mouth every 8 (eight) hours as needed for nausea or vomiting. 30 tablet 0  . tiZANidine (ZANAFLEX) 2 MG tablet Take 1 tablet (2 mg total) by mouth every 6 (six) hours as needed. 30 tablet 0   No current facility-administered medications on file prior to visit.   Allergies  Allergen Reactions  . Dilaudid [Hydromorphone Hcl] Nausea And Vomiting  . Adhesive [Tape] Rash  . Ampicillin Rash    REACTION: Rash and welts on her hands when she took it in the hospital  . Penicillins Rash   Social History   Social History  . Marital Status: Married    Spouse Name: N/A  . Number of Children: 1  . Years of Education: N/A   Occupational History  . Retired since 2003    Social History Main Topics  . Smoking status: Never Smoker   . Smokeless tobacco: Never Used  . Alcohol Use: No  . Drug Use: No  . Sexual Activity: Not on file   Other Topics Concern  . Not on file   Social History Narrative      Review of Systems  All other systems reviewed and are negative.      Objective:   Physical Exam  Cardiovascular: Normal rate, regular rhythm and normal heart sounds.   Pulmonary/Chest: Effort normal and breath sounds normal. No respiratory distress. She has no wheezes. She has  no rales.  Abdominal: Soft. Bowel sounds are normal.  Vitals reviewed.         Assessment & Plan:  OAB (overactive bladder)  Begin Vesicare 5 mg by mouth daily and increase to 10 mg by mouth daily in one week if not sufficient. Recheck in 3 weeks

## 2014-12-22 DIAGNOSIS — M961 Postlaminectomy syndrome, not elsewhere classified: Secondary | ICD-10-CM | POA: Diagnosis not present

## 2014-12-22 DIAGNOSIS — M5416 Radiculopathy, lumbar region: Secondary | ICD-10-CM | POA: Diagnosis not present

## 2014-12-23 ENCOUNTER — Other Ambulatory Visit: Payer: Self-pay | Admitting: Family Medicine

## 2014-12-24 NOTE — Telephone Encounter (Signed)
Refill appropriate and filled per protocol. 

## 2014-12-26 DIAGNOSIS — M961 Postlaminectomy syndrome, not elsewhere classified: Secondary | ICD-10-CM | POA: Diagnosis not present

## 2014-12-26 DIAGNOSIS — M5416 Radiculopathy, lumbar region: Secondary | ICD-10-CM | POA: Diagnosis not present

## 2015-01-14 DIAGNOSIS — H5210 Myopia, unspecified eye: Secondary | ICD-10-CM | POA: Diagnosis not present

## 2015-01-14 LAB — HM DIABETES EYE EXAM

## 2015-01-19 ENCOUNTER — Encounter: Payer: Self-pay | Admitting: Family Medicine

## 2015-01-19 ENCOUNTER — Ambulatory Visit (INDEPENDENT_AMBULATORY_CARE_PROVIDER_SITE_OTHER): Payer: Commercial Managed Care - HMO | Admitting: Family Medicine

## 2015-01-19 VITALS — BP 132/90 | HR 68 | Temp 98.1°F | Resp 18 | Wt 195.0 lb

## 2015-01-19 DIAGNOSIS — H6122 Impacted cerumen, left ear: Secondary | ICD-10-CM

## 2015-01-19 DIAGNOSIS — E0841 Diabetes mellitus due to underlying condition with diabetic mononeuropathy: Secondary | ICD-10-CM

## 2015-01-19 MED ORDER — PREGABALIN 100 MG PO CAPS: 100.0000 mg | ORAL_CAPSULE | Freq: Two times a day (BID) | ORAL | Status: DC

## 2015-01-19 NOTE — Progress Notes (Signed)
Subjective:    Patient ID: [name], female    DOB: July 06, 1950, 64 y.o.   MRN: [id number]  HPI  patient has a cerumen impaction in her left ear. She is requesting irrigation and lavage to clear this. She also reports burning stinging pain in both feet. She has numbness in both feet. She has long-standing neuropathy secondary to diabetes. Her insurance posterior to try gabapentin 300 mg by mouth 3 times a day and it was completely ineffective. Past Medical History  Diagnosis Date  . GERD (gastroesophageal reflux disease)   . Hypothyroidism   . Peripheral neuropathy   . Cardiomyopathy   . Chest pain 06/2005    Hospitalized, dystolic dysfunction  . Sciatica   . Type II diabetes mellitus   . PONV (postoperative nausea and vomiting)   . H/O hiatal hernia   . Migraines     "it's been a long time since I've had one" (03/20/2012)  . Osteoarthritis   . Arthritis     "all over; mostly in my back" (03/20/2012)  . Chronic lower back pain     "have 2 herniated discs; going to have to have a fusion" (03/20/2012)  . Breast cancer     "right" (03/20/2012)  . Family history of anesthesia complication     "father has nausea and vomiting"  . Irritable bowel syndrome   . Hypertension     "patient states never had HTN, takes Hyzaar for heart  . History of right mastectomy   . Anginal pain 03/19/2012    saw Dr. Cathie Olden .. she thinks its more related to stomach issues  . History of kidney stones    Past Surgical History  Procedure Laterality Date  . Abdominal hysterectomy  1993  . Total knee arthroplasty  2000    Right  . Mastectomy with axillary lymph node dissection  12/2003    "right" (03/20/2012)  . Thyroidectomy  1977    Right  . Posterior cervical fusion/foraminotomy  2001  . Shoulder arthroscopy w/ rotator cuff repair  2003    Left  . Cardiac catheterization  ? 2005 / 2007  . Adenosin cardiolite  07/2005    (+) wall motion abnormality  . Esophagogastroduodenoscopy  2005      GERD  . [place] of abdomen  07/2005    Fatty liver / kidney stones  . Ct abdomen and pelvis  02/2010    Same  . Knee arthroscopy  1990's    "right" (03/20/2012)  . Axillary lymph node dissection  2003    squamous cell cancer  . Back surgery    . Lumbar laminectomy/decompression microdiscectomy N/A 02/04/2014    Procedure: RIGHT L2-3 MICRODISCECTOMY ;  Surgeon: Jessy Oto, MD;  Location: [facility];  Service: Orthopedics;  Laterality: N/A;  . Lumbar fusion N/A 08/22/2014    Procedure: Right L2-3 and right L1-2 transforaminal lumbar interbody fusion with pedicle screws, rods, sleeves, cages, local bone graft, Vivigen, cancellous chips;  Surgeon: Jessy Oto, MD;  Location: [facility];  Service: Orthopedics;  Laterality: N/A;   Current Outpatient Prescriptions on File Prior to Visit  Medication Sig Dispense Refill  . alendronate (FOSAMAX) 70 MG tablet Take 70 mg by mouth every Monday.  3  . aspirin 81 MG tablet Take 81 mg by mouth daily.    . Cholecalciferol (VITAMIN D3) 5000 UNITS CAPS Take 5,000 Units by mouth 2 (two) times daily.    . Empagliflozin-Linagliptin 25-5 MG TABS Take 1 tablet by mouth  daily. (Patient taking differently: Take 1 tablet by mouth daily. Glyxambi) 30 tablet 3  . gabapentin (NEURONTIN) 300 MG capsule Take 1 capsule (300 mg total) by mouth 3 (three) times daily. 90 capsule 3  . HYDROcodone-acetaminophen (NORCO) 7.5-325 MG per tablet TAKE 1 TO 2 TABLETS BY MOUTH EVERY 4 TO 6 HOURS AS NEEDED FOR PAIN MAXIMUM 8 TABLETS PER DAY  0  . ibuprofen (ADVIL,MOTRIN) 800 MG tablet Take 800 mg every 8 hours for 2 days. 30 tablet 0  . insulin aspart (NOVOLOG) 100 UNIT/ML FlexPen Inject 15 Units into the skin 3 (three) times daily with meals. 5 pen 11  . insulin glargine (LANTUS) 100 unit/mL SOPN Inject 0.45 mLs (45 Units total) into the skin 2 (two) times daily. 15 mL 5  . Insulin Pen Needle (PEN NEEDLES 5/16") 30G X 8 MM MISC Use to inject insulin 5x daily. 200 each 11  . levothyroxine  (SYNTHROID, LEVOTHROID) 100 MCG tablet Take 1 tablet (100 mcg total) by mouth daily. 30 tablet 11  . losartan-hydrochlorothiazide (HYZAAR) 50-12.5 MG per tablet Take 1 tablet by mouth daily. 30 tablet 11  . metFORMIN (GLUCOPHAGE) 850 MG tablet TAKE 1 TABLET BY MOUTH TWICE A DAY WITH MEALS 60 tablet 2  . Misc Natural Products (LAXATIVE FORMULA) TABS Take 1 tablet by mouth as needed (for BM).    . naloxegol oxalate (MOVANTIK) 25 MG TABS tablet Take 1 tablet (25 mg total) by mouth daily. 9 tablet 0  . ondansetron (ZOFRAN) 4 MG tablet Take 1 tablet (4 mg total) by mouth every 6 (six) hours. 12 tablet 0  . promethazine (PHENERGAN) 25 MG tablet TAKE 1 TABLET (25 MG TOTAL) BY MOUTH EVERY 8 (EIGHT) HOURS AS NEEDED FOR NAUSEA OR VOMITING. 20 tablet 2  . tiZANidine (ZANAFLEX) 2 MG tablet Take 1 tablet (2 mg total) by mouth every 6 (six) hours as needed. 30 tablet 0   No current facility-administered medications on file prior to visit.   Allergies  Allergen Reactions  . Dilaudid [Hydromorphone Hcl] Nausea And Vomiting  . Adhesive [Tape] Rash  . Ampicillin Rash    REACTION: Rash and welts on her hands when she took it in the hospital  . Penicillins Rash   Social History   Social History  . Marital Status: Married    Spouse Name: N/A  . Number of Children: 1  . Years of Education: N/A   Occupational History  . Retired since 2003    Social History Main Topics  . Smoking status: Never Smoker   . Smokeless tobacco: Never Used  . Alcohol Use: No  . Drug Use: No  . Sexual Activity: Not on file   Other Topics Concern  . Not on file   Social History Narrative     Review of Systems  All other systems reviewed and are negative.      Objective:   Physical Exam  Constitutional: She appears well-developed and well-nourished.  Cardiovascular: Normal rate, regular rhythm and normal heart sounds.   Pulmonary/Chest: Effort normal and breath sounds normal. No respiratory distress. She has no  wheezes. She has no rales.  Vitals reviewed.    patient has a cerumen impaction in her left ear. She also has numbness in both feet and burning and stinging pain in both feet      Assessment & Plan:  Diabetic mononeuropathy associated with diabetes mellitus due to underlying condition ([place]) - Plan: pregabalin (LYRICA) 100 MG capsule  Cerumen impaction, left  cerumen impaction was removed with irrigation and lavage. Discontinue gabapentin and begin Lyrica 100 mg by mouth twice a day. Check fasting blood sugars and two-hour postprandial sugars and allow me to see the values in one week so that I can titrate her medications to achieve control of her diabetes. Patient is extremely sweet but also very noncompliant I do not believe she's checking her sugars at all

## 2015-01-29 ENCOUNTER — Ambulatory Visit (INDEPENDENT_AMBULATORY_CARE_PROVIDER_SITE_OTHER): Payer: Commercial Managed Care - HMO | Admitting: Family Medicine

## 2015-01-29 ENCOUNTER — Encounter: Payer: Self-pay | Admitting: Family Medicine

## 2015-01-29 VITALS — BP 142/60 | HR 60 | Temp 98.4°F | Resp 18 | Ht 65.0 in | Wt 196.0 lb

## 2015-01-29 DIAGNOSIS — J312 Chronic pharyngitis: Secondary | ICD-10-CM

## 2015-01-29 NOTE — Progress Notes (Signed)
Subjective:    Patient ID: [name], female    DOB: [birth year], 64 y.o.   MRN: [id number]  HPI  Patient reports 5 weeks of pain in the left side of her throat. The pain is located around the level of the cricoid cartilage. She complains of pain around her vocal cords. It hurts terribly when she swallows. This began in early September and has persisted even though she is tried ibuprofen, warm saltwater gargles, gargling peroxide, etc. She denies any acid reflux. She denies any hemoptysis. She does complain of hoarseness. The more she tries to talk the worst the pain becomes. She denies any hematemesis nausea or vomiting. She denies any weight loss. Her fasting blood sugars are still greater than 200 and she is only taking toujeo 45 units twice a day Past Medical History  Diagnosis Date  . GERD (gastroesophageal reflux disease)   . Hypothyroidism   . Peripheral neuropathy ([place])   . Cardiomyopathy   . Chest pain 06/2005    Hospitalized, dystolic dysfunction  . Sciatica   . Type II diabetes mellitus ([place])   . PONV (postoperative nausea and vomiting)   . H/O hiatal hernia   . Migraines     "it's been a long time since I've had one" (03/20/2012)  . Osteoarthritis   . Arthritis     "all over; mostly in my back" (03/20/2012)  . Chronic lower back pain     "have 2 herniated discs; going to have to have a fusion" (03/20/2012)  . Breast cancer ([place])     "right" (03/20/2012)  . Family history of anesthesia complication     "father has nausea and vomiting"  . Irritable bowel syndrome   . Hypertension     "patient states never had HTN, takes Hyzaar for heart  . History of right mastectomy   . Anginal pain ([place]) 03/19/2012    saw Dr. Cathie Olden .. she thinks its more related to stomach issues  . History of kidney stones    Past Surgical History  Procedure Laterality Date  . Abdominal hysterectomy  1993  . Total knee arthroplasty  2000    Right  . Mastectomy with axillary lymph node  dissection  12/2003    "right" (03/20/2012)  . Thyroidectomy  1977    Right  . Posterior cervical fusion/foraminotomy  2001  . Shoulder arthroscopy w/ rotator cuff repair  2003    Left  . Cardiac catheterization  ? 2005 / 2007  . Adenosin cardiolite  07/2005    (+) wall motion abnormality  . Esophagogastroduodenoscopy  2005    GERD  . [place] of abdomen  07/2005    Fatty liver / kidney stones  . Ct abdomen and pelvis  02/2010    Same  . Knee arthroscopy  1990's    "right" (03/20/2012)  . Axillary lymph node dissection  2003    squamous cell cancer  . Back surgery    . Lumbar laminectomy/decompression microdiscectomy N/A 02/04/2014    Procedure: RIGHT L2-3 MICRODISCECTOMY ;  Surgeon: Jessy Oto, MD;  Location: [facility];  Service: Orthopedics;  Laterality: N/A;  . Lumbar fusion N/A 08/22/2014    Procedure: Right L2-3 and right L1-2 transforaminal lumbar interbody fusion with pedicle screws, rods, sleeves, cages, local bone graft, Vivigen, cancellous chips;  Surgeon: Jessy Oto, MD;  Location: [facility];  Service: Orthopedics;  Laterality: N/A;   Current Outpatient Prescriptions on File Prior to Visit  Medication Sig Dispense Refill  .  alendronate (FOSAMAX) 70 MG tablet Take 70 mg by mouth every Monday.  3  . aspirin 81 MG tablet Take 81 mg by mouth daily.    . Cholecalciferol (VITAMIN D3) 5000 UNITS CAPS Take 5,000 Units by mouth 2 (two) times daily.    . Empagliflozin-Linagliptin 25-5 MG TABS Take 1 tablet by mouth daily. (Patient taking differently: Take 1 tablet by mouth daily. Glyxambi) 30 tablet 3  . gabapentin (NEURONTIN) 300 MG capsule Take 1 capsule (300 mg total) by mouth 3 (three) times daily. 90 capsule 3  . HYDROcodone-acetaminophen (NORCO) 7.5-325 MG per tablet TAKE 1 TO 2 TABLETS BY MOUTH EVERY 4 TO 6 HOURS AS NEEDED FOR PAIN MAXIMUM 8 TABLETS PER DAY  0  . ibuprofen (ADVIL,MOTRIN) 800 MG tablet Take 800 mg every 8 hours for 2 days. 30 tablet 0  . insulin aspart (NOVOLOG) 100  UNIT/ML FlexPen Inject 15 Units into the skin 3 (three) times daily with meals. 5 pen 11  . insulin glargine (LANTUS) 100 unit/mL SOPN Inject 0.45 mLs (45 Units total) into the skin 2 (two) times daily. 15 mL 5  . Insulin Pen Needle (PEN NEEDLES 5/16") 30G X 8 MM MISC Use to inject insulin 5x daily. 200 each 11  . levothyroxine (SYNTHROID, LEVOTHROID) 100 MCG tablet Take 1 tablet (100 mcg total) by mouth daily. 30 tablet 11  . losartan-hydrochlorothiazide (HYZAAR) 50-12.5 MG per tablet Take 1 tablet by mouth daily. 30 tablet 11  . metFORMIN (GLUCOPHAGE) 850 MG tablet TAKE 1 TABLET BY MOUTH TWICE A DAY WITH MEALS 60 tablet 2  . Misc Natural Products (LAXATIVE FORMULA) TABS Take 1 tablet by mouth as needed (for BM).    . naloxegol oxalate (MOVANTIK) 25 MG TABS tablet Take 1 tablet (25 mg total) by mouth daily. 9 tablet 0  . ondansetron (ZOFRAN) 4 MG tablet Take 1 tablet (4 mg total) by mouth every 6 (six) hours. 12 tablet 0  . pregabalin (LYRICA) 100 MG capsule Take 1 capsule (100 mg total) by mouth 2 (two) times daily. 60 capsule 5  . promethazine (PHENERGAN) 25 MG tablet TAKE 1 TABLET (25 MG TOTAL) BY MOUTH EVERY 8 (EIGHT) HOURS AS NEEDED FOR NAUSEA OR VOMITING. 20 tablet 2  . tiZANidine (ZANAFLEX) 2 MG tablet Take 1 tablet (2 mg total) by mouth every 6 (six) hours as needed. 30 tablet 0   No current facility-administered medications on file prior to visit.   Allergies  Allergen Reactions  . Dilaudid [Hydromorphone Hcl] Nausea And Vomiting  . Adhesive [Tape] Rash  . Ampicillin Rash    REACTION: Rash and welts on her hands when she took it in the hospital  . Penicillins Rash   Social History   Social History  . Marital Status: Married    Spouse Name: N/A  . Number of Children: 1  . Years of Education: N/A   Occupational History  . Retired since 2003    Social History Main Topics  . Smoking status: Never Smoker   . Smokeless tobacco: Never Used  . Alcohol Use: No  . Drug Use: No    . Sexual Activity: Not on file   Other Topics Concern  . Not on file   Social History Narrative     Review of Systems  All other systems reviewed and are negative.      Objective:   Physical Exam  HENT:  Right Ear: External ear normal.  Left Ear: External ear normal.  Nose: Nose normal.  Mouth/Throat: Oropharynx is clear and moist. No oropharyngeal exudate.  Eyes: Conjunctivae are normal.  Neck: Neck supple. No JVD present. No thyromegaly present.  Cardiovascular: Normal rate, regular rhythm and normal heart sounds.   Pulmonary/Chest: Effort normal and breath sounds normal.  Lymphadenopathy:    She has no cervical adenopathy.          Assessment & Plan:  Chronic sore throat - Plan: Ambulatory referral to ENT  Examination of the oropharynx reveals a large 5 mm canker sore on the left side of her tongue. This is rubbing chronically against a fractured tooth. It is likely an irritated area because of the fractured tooth. I recommended she follow-up with a dentist to correct this problem. However the this is not the source of her chronic sore throat which appears to be further down in the larynx near the vocal cords. Therefore I will refer the patient to an ENT physician for direct laryngoscopy. Also recommended she increase her insulin to 60 units twice a day and recheck fasting blood sugars and two-hour postprandial sugars in one week

## 2015-02-04 ENCOUNTER — Telehealth: Payer: Self-pay | Admitting: *Deleted

## 2015-02-04 NOTE — Telephone Encounter (Signed)
Submitted humana referral thru acuity connect for authorization on 02/03/15 to Dr. Kasandra Knudsen Teoh,MD with authoriztion [id number]  Requesting provider: Flonnie Hailstone  Treating provider: Tamsen Roers Teoh,MD  Number of visits:6  Start Date:02/10/15  End Date:08/09/15  Dx:J31.2- Chronic pharyngitis  Copy has been faxed to Dr. Benjamine Mola office

## 2015-02-05 ENCOUNTER — Other Ambulatory Visit: Payer: Self-pay | Admitting: Family Medicine

## 2015-02-05 NOTE — Telephone Encounter (Signed)
Refill appropriate and filled per protocol. 

## 2015-02-10 DIAGNOSIS — K219 Gastro-esophageal reflux disease without esophagitis: Secondary | ICD-10-CM | POA: Diagnosis not present

## 2015-02-10 DIAGNOSIS — H9209 Otalgia, unspecified ear: Secondary | ICD-10-CM | POA: Diagnosis not present

## 2015-02-10 DIAGNOSIS — R070 Pain in throat: Secondary | ICD-10-CM | POA: Diagnosis not present

## 2015-03-27 DIAGNOSIS — M4316 Spondylolisthesis, lumbar region: Secondary | ICD-10-CM | POA: Diagnosis not present

## 2015-03-27 DIAGNOSIS — M7741 Metatarsalgia, right foot: Secondary | ICD-10-CM | POA: Diagnosis not present

## 2015-03-27 DIAGNOSIS — M5416 Radiculopathy, lumbar region: Secondary | ICD-10-CM | POA: Diagnosis not present

## 2015-03-27 DIAGNOSIS — M961 Postlaminectomy syndrome, not elsewhere classified: Secondary | ICD-10-CM | POA: Diagnosis not present

## 2015-04-10 ENCOUNTER — Ambulatory Visit (INDEPENDENT_AMBULATORY_CARE_PROVIDER_SITE_OTHER): Payer: Commercial Managed Care - HMO | Admitting: Family Medicine

## 2015-04-10 ENCOUNTER — Encounter: Payer: Self-pay | Admitting: Family Medicine

## 2015-04-10 VITALS — BP 138/64 | HR 82 | Temp 97.8°F | Resp 16 | Ht 65.0 in | Wt 197.0 lb

## 2015-04-10 DIAGNOSIS — L0390 Cellulitis, unspecified: Secondary | ICD-10-CM | POA: Diagnosis not present

## 2015-04-10 DIAGNOSIS — B9562 Methicillin resistant Staphylococcus aureus infection as the cause of diseases classified elsewhere: Secondary | ICD-10-CM | POA: Diagnosis not present

## 2015-04-10 MED ORDER — SULFAMETHOXAZOLE-TRIMETHOPRIM 800-160 MG PO TABS: 1.0000 | ORAL_TABLET | Freq: Two times a day (BID) | ORAL | Status: DC

## 2015-04-10 NOTE — Patient Instructions (Signed)
Take antibiotics as prescribed Keep clean and dry Stop neosporin Come in Tuesday if not improved

## 2015-04-10 NOTE — Progress Notes (Signed)
   Subjective:    Patient ID: [name], female    DOB: Aug 31, 1950, 64 y.o.   MRN: [id number]  Patient presents for Open Area to R side of Breast  patient here with an area draining into her right breast. This is the same side that she's had breast cancer in. She's had a mastectomy as well as lymph node removal on this right side. She states for the past month the wound continues to open up and then will seem to close up. Last week there was water blister and she took a pen and popped it but since then it has not healed she was also using Neosporin. She's not had a fever no chills she does not have any pain in the area. She has noticed more redness around where the previous blister was. History of MRSA   Review Of Systems:  GEN- denies fatigue, fever, weight loss,weakness, recent illness HEENT- denies eye drainage, change in vision, nasal discharge, CVS- denies chest pain, palpitations RESP- denies SOB, cough, wheeze ABD- denies N/V, change in stools, abd pain Neuro- denies headache, dizziness, syncope, seizure activity       Objective:    BP 138/64 mmHg  Pulse 82  Temp(Src) 97.8 F (36.6 C) (Oral)  Resp 16  Ht 5\' 5"  (1.651 m)  Wt 197 lb (89.359 kg)  BMI 32.78 kg/m2 GEN- NAD, alert and oriented x3 Skin- edge of Right mastectomy scar- dime size scab with mininal serosangiouness drainage and erythema 2-3cm surrounding, no flutance, NT, no nodules  No nodes in axilla         Assessment & Plan:      Problem List Items Addressed This Visit    None    Visit Diagnoses    MRSA cellulitis    -  Primary    Based on history, possible MRSA infection as spontanous blisters, other differential underlying cancer recurrence though not palpable,. Will treat with Bactrim, discontinue the Neosporin. She will keep clean with soap and water and keep bandage. If there is no improvement to that she will return on Tuesday for recheck and then I will consider getting an ultrasound on this  side is this is the side she has had cancer on twice.        Note: This dictation was prepared with Dragon dictation along with smaller phrase technology. Any transcriptional errors that result from this process are unintentional.

## 2015-04-15 ENCOUNTER — Telehealth: Payer: Self-pay | Admitting: *Deleted

## 2015-04-15 NOTE — Telephone Encounter (Signed)
Submitted humana referral thru acuity connect for authorization on 03/27/15 to Dr. Larae Grooms with authorization [id number]  Requesting provider: Flonnie Hailstone  Treating provider; Larae Grooms  Number of visits:6  Start Date:03/27/15  End Date:09/23/15  Dx: M43.16- Spondylolisthesis,lumbar region       M51.16- Intervertebral disc disorder wi radiculopathy,lumbar region       M96.1- post laminectomy syndrome, not elsewhere classified

## 2015-04-16 ENCOUNTER — Telehealth: Payer: Self-pay | Admitting: *Deleted

## 2015-04-16 NOTE — Telephone Encounter (Signed)
Received call from patient. Patient saw MD on 04/10/15 for possible MRSA cellulitis on R side of R breast.   States that she has (2) new areas of redness on the R axilla that she feels may be new beginnings of cellulitis. Denies pain or tenderness to touch. Denies open areas or drainage. Denies fever or warmth to touch.   Reports that she is only half way through ABTx with Bactrim DS.    MD please advise.

## 2015-04-16 NOTE — Telephone Encounter (Signed)
Ok to F/U Monday?  MD please advise.

## 2015-04-16 NOTE — Telephone Encounter (Signed)
Dr. D wanted her to come back if not better.

## 2015-04-17 ENCOUNTER — Other Ambulatory Visit: Payer: Self-pay | Admitting: Family Medicine

## 2015-04-17 ENCOUNTER — Ambulatory Visit
Admission: RE | Admit: 2015-04-17 | Discharge: 2015-04-17 | Disposition: A | Discharge status: Home / Self Care | Payer: Commercial Managed Care - HMO | Source: Ambulatory Visit | Attending: Family Medicine | Admitting: Family Medicine

## 2015-04-17 ENCOUNTER — Ambulatory Visit (INDEPENDENT_AMBULATORY_CARE_PROVIDER_SITE_OTHER): Payer: Commercial Managed Care - HMO | Admitting: Family Medicine

## 2015-04-17 ENCOUNTER — Ambulatory Visit: Payer: Self-pay | Admitting: Family Medicine

## 2015-04-17 ENCOUNTER — Encounter: Payer: Self-pay | Admitting: Family Medicine

## 2015-04-17 VITALS — BP 136/82 | HR 88 | Temp 97.8°F | Resp 14 | Ht 65.0 in | Wt 197.0 lb

## 2015-04-17 DIAGNOSIS — B9562 Methicillin resistant Staphylococcus aureus infection as the cause of diseases classified elsewhere: Secondary | ICD-10-CM

## 2015-04-17 DIAGNOSIS — L0390 Cellulitis, unspecified: Secondary | ICD-10-CM

## 2015-04-17 DIAGNOSIS — N611 Abscess of the breast and nipple: Secondary | ICD-10-CM

## 2015-04-17 DIAGNOSIS — N644 Mastodynia: Secondary | ICD-10-CM

## 2015-04-17 DIAGNOSIS — N6489 Other specified disorders of breast: Secondary | ICD-10-CM | POA: Diagnosis not present

## 2015-04-17 LAB — CBC W/MCH & 3 PART DIFF
HCT: 47.3 % — ABNORMAL HIGH (ref 36.0–46.0)
Hemoglobin: 14.3 g/dL (ref 12.0–15.0)
LYMPHS ABS: 2.5 10*3/uL (ref 0.7–4.0)
LYMPHS PCT: 41 % (ref 12–46)
MCH: 29 pg (ref 26.0–34.0)
MCHC: 30.2 g/dL (ref 30.0–36.0)
MCV: 95.9 fL (ref 78.0–100.0)
NEUTROS ABS: 2.9 10*3/uL (ref 1.7–7.7)
NEUTROS PCT: 47 % (ref 43–77)
Platelets: 278 10*3/uL (ref 150–400)
RBC: 4.93 MIL/uL (ref 3.87–5.11)
RDW: 16.3 % — AB (ref 11.5–15.5)
WBC: 6.2 10*3/uL (ref 4.0–10.5)
WBCMIX: 0.7 10*3/uL (ref 0.1–1.8)
WBCMIXPER: 12 % (ref 3–18)

## 2015-04-17 MED ORDER — CLINDAMYCIN HCL 300 MG PO CAPS: 300.0000 mg | ORAL_CAPSULE | Freq: Three times a day (TID) | ORAL | Status: DC

## 2015-04-17 NOTE — Telephone Encounter (Signed)
Call placed to patient and patient made aware.   Appointment scheduled.  

## 2015-04-17 NOTE — Patient Instructions (Addendum)
Stop the gabapentin We will call with [place] results Continue wash with betadine Stop the bactrim Start clindamycin

## 2015-04-17 NOTE — Telephone Encounter (Signed)
Schedule for today

## 2015-04-19 NOTE — Progress Notes (Signed)
Patient ID: [name], female   DOB: Jan 30, 1951, 65 y.o.   MRN: [id number]   Subjective:    Patient ID: [name], female    DOB: Mar 07, 1951, 65 y.o.   MRN: [id number]  Patient presents for F/U Cellulitis   Pt here for intermin f/u on cellulitis and ulceration of skin beneath the right axilla over previous breast tissue. She was started on Bactrim but had more sores come up, like small ulceration since being on antibiotics, no fever, no chills, no pain. No drainage and no blisters this time.    CBC done in house- wnl    Review Of Systems:  GEN- denies fatigue, fever, weight loss,weakness, recent illness HEENT- denies eye drainage, change in vision, nasal discharge, CVS- denies chest pain, palpitations RESP- denies SOB, cough, wheeze ABD- denies N/V, change in stools, abd pain GU- denies dysuria, hematuria, dribbling, incontinence MSK- denies joint pain, muscle aches, injury Neuro- denies headache, dizziness, syncope, seizure activity       Objective:    BP 136/82 mmHg  Pulse 88  Temp(Src) 97.8 F (36.6 C) (Oral)  Resp 14  Ht 5\' 5"  (1.651 m)  Wt 197 lb (89.359 kg)  BMI 32.78 kg/m2 GEN- NAD, alert and oriented x3  Skin- edge of Right mastectomy scar- multiple dime size scabs with shallow ulcerations  erythema 2-3cm surrounding each, no flutance, NT, no nodules, no blisters   No nodes in axilla         Assessment & Plan:      Problem List Items Addressed This Visit    None    Visit Diagnoses    MRSA cellulitis    -  Primary    Change to clindamycin, US obtained this afternoon to r/o small absccess that I am not seeing or mass, [place] negative. If not improved Biopsy to be done She has more lesions of unknown cause, history of MRSA, no blisters or boils look like they come up as red spots and start to ulcerate    Relevant Orders    CBC w/[facility] & 3 Part Diff (Completed)    US BREAST LTD UNI RIGHT INC AXILLA (Completed)    Breast abscess        Relevant  Orders    CBC w/[facility] & 3 Part Diff (Completed)    US BREAST LTD UNI RIGHT INC AXILLA (Completed)       Note: This dictation was prepared with Dragon dictation along with smaller phrase technology. Any transcriptional errors that result from this process are unintentional.

## 2015-04-30 ENCOUNTER — Other Ambulatory Visit: Payer: Self-pay | Admitting: Family Medicine

## 2015-05-27 ENCOUNTER — Ambulatory Visit (INDEPENDENT_AMBULATORY_CARE_PROVIDER_SITE_OTHER): Payer: Commercial Managed Care - HMO | Admitting: Family Medicine

## 2015-05-27 ENCOUNTER — Encounter: Payer: Self-pay | Admitting: Family Medicine

## 2015-05-27 VITALS — BP 140/82 | HR 78 | Temp 99.0°F | Resp 16 | Ht 65.0 in | Wt 200.0 lb

## 2015-05-27 DIAGNOSIS — L0390 Cellulitis, unspecified: Secondary | ICD-10-CM

## 2015-05-27 DIAGNOSIS — I10 Essential (primary) hypertension: Secondary | ICD-10-CM | POA: Diagnosis not present

## 2015-05-27 DIAGNOSIS — G990 Autonomic neuropathy in diseases classified elsewhere: Secondary | ICD-10-CM | POA: Diagnosis not present

## 2015-05-27 DIAGNOSIS — B9562 Methicillin resistant Staphylococcus aureus infection as the cause of diseases classified elsewhere: Secondary | ICD-10-CM

## 2015-05-27 DIAGNOSIS — E039 Hypothyroidism, unspecified: Secondary | ICD-10-CM

## 2015-05-27 DIAGNOSIS — E1143 Type 2 diabetes mellitus with diabetic autonomic (poly)neuropathy: Secondary | ICD-10-CM | POA: Diagnosis not present

## 2015-05-27 LAB — CBC WITH DIFFERENTIAL/PLATELET
Basophils Absolute: 0.1 10*3/uL (ref 0.0–0.1)
Basophils Relative: 1 % (ref 0–1)
Eosinophils Absolute: 0.2 10*3/uL (ref 0.0–0.7)
Eosinophils Relative: 3 % (ref 0–5)
HCT: 42.9 % (ref 36.0–46.0)
HEMOGLOBIN: 14 g/dL (ref 12.0–15.0)
Lymphocytes Relative: 33 % (ref 12–46)
Lymphs Abs: 2.1 10*3/uL (ref 0.7–4.0)
MCH: 29 pg (ref 26.0–34.0)
MCHC: 32.6 g/dL (ref 30.0–36.0)
MCV: 89 fL (ref 78.0–100.0)
MONO ABS: 0.4 10*3/uL (ref 0.1–1.0)
MONOS PCT: 7 % (ref 3–12)
MPV: 9.1 fL (ref 8.6–12.4)
NEUTROS PCT: 56 % (ref 43–77)
Neutro Abs: 3.5 10*3/uL (ref 1.7–7.7)
Platelets: 298 10*3/uL (ref 150–400)
RBC: 4.82 MIL/uL (ref 3.87–5.11)
RDW: 14.7 % (ref 11.5–15.5)
WBC: 6.3 10*3/uL (ref 4.0–10.5)

## 2015-05-27 LAB — T3, FREE: T3 FREE: 2.9 pg/mL (ref 2.3–4.2)

## 2015-05-27 LAB — TSH: TSH: 2.78 mIU/L

## 2015-05-27 LAB — HEMOGLOBIN A1C
Hgb A1c MFr Bld: 11.7 % — ABNORMAL HIGH (ref <5.7)
Mean Plasma Glucose: 289 mg/dL — ABNORMAL HIGH (ref <117)

## 2015-05-27 LAB — T4, FREE: FREE T4: 1 ng/dL (ref 0.8–1.8)

## 2015-05-27 MED ORDER — PREGABALIN 100 MG PO CAPS: 100.0000 mg | ORAL_CAPSULE | Freq: Two times a day (BID) | ORAL | Status: DC

## 2015-05-27 MED ORDER — ONDANSETRON HCL 4 MG PO TABS: 4.0000 mg | ORAL_TABLET | Freq: Four times a day (QID) | ORAL | Status: DC

## 2015-05-27 MED ORDER — FLUTICASONE PROPIONATE 50 MCG/ACT NA SUSP: 2.0000 | Freq: Every day | NASAL | Status: DC

## 2015-05-27 MED ORDER — ALENDRONATE SODIUM 70 MG PO TABS: 70.0000 mg | ORAL_TABLET | ORAL | Status: DC

## 2015-05-27 NOTE — Patient Instructions (Addendum)
Bactrim take 1 tablet twice a day for 5 days  We will call with labs Referral to dermatology F/U 2 months

## 2015-05-27 NOTE — Assessment & Plan Note (Signed)
Blood pressure looks okay  continue the losartan hydrochlorothiazide

## 2015-05-27 NOTE — Assessment & Plan Note (Signed)
Recheck thyroid function studies. 

## 2015-05-27 NOTE — Assessment & Plan Note (Signed)
She is in denial with regards to her diabetes mellitus she is not taking care of herself or taking her medicine as prescribed. She is asked to supposed to be on long-acting insulin as well as short acting with meals she is not on the  NovoLog. We'll recheck her A1c today she will continue her Lantus as well as the metformin 850 mg twice a day  Think her uncontrolled diabetes is contributing to a lot of her generalized issues including her neuropathy or recurrent skin infections or fatigue weight issues  Recommend that she check her blood sugar 3 times a day

## 2015-05-27 NOTE — Assessment & Plan Note (Signed)
Restart Lyrica 100 mg twice a day she has failed gabapentin

## 2015-05-27 NOTE — Progress Notes (Signed)
Patient ID: [name], female   DOB: 1951/04/08, 65 y.o.   MRN: [id number]    Subjective:    Patient ID: [name], female    DOB: Aug 15, 1950, 65 y.o.   MRN: [id number]  Patient presents for Neuropathy and MRSA Infection  patient to follow-up. She was treated for presumptive MRSA cellulitis January 2017 of her right breast she had recurrent ulcerations that were, but had mild drainage. She was treated with Bactrim initially,  then transitioned to clindamycin. She was sent for ultrasound of the breast to rule out other abscess she has had breast cancer as well and the ultrasound was normal. Last night she noticed redness pain and a blister in the same region is previous infection  His history of diabetes mellitus which is been uncontrolled she also has diabetic neuropathy. She was on gabapentin last A1c was 8.4% in August 2016. Was taken off of gabapentin in October per her primary care provider's notes and she was started on Lyrica 100 mg twice a day   However she states that her insurance would not pay for the Lyrica and she went back on the gabapentin even that was not helping and then it started causing her to have hallucinations. She states she does not know why she is not taking care of her diabetes since his something about it. She has had family members that have died from diabetes complications.  Hypothyroidism, taking Synthroid every morning   Review Of Systems:  GEN- denies fatigue, fever, weight loss,weakness, recent illness HEENT- denies eye drainage, change in vision, nasal discharge, CVS- denies chest pain, palpitations RESP- denies SOB, cough, wheeze ABD- denies N/V, change in stools, abd pain GU- denies dysuria, hematuria, dribbling, incontinence MSK- + joint pain, muscle aches, injury Neuro- denies headache, dizziness, syncope, seizure activity       Objective:    BP 140/82 mmHg  Pulse 78  Temp(Src) 99 F (37.2 C) (Oral)  Resp 16  Ht 5\' 5"  (1.651 m)   Wt 200 lb (90.719 kg)  BMI 33.28 kg/m2 GEN- NAD, alert and oriented x3 HEENT- PERRL, EOMI, non injected sclera, pink conjunctiva, MMM, oropharynx clear Neck- Supple, no thyromegaly CVS- RRR, no murmur RESP-CTAB Skin- edge of Right mastectomy scar- healed scab with erythema surrounding, dime size deflated blister, TTP, no induration  No nodes in axilla  EXT- No edema- callus feet bilat         Assessment & Plan:      Problem List Items Addressed This Visit    Hypothyroidism     Recheck thyroid function studies      Relevant Orders   TSH   T3, free   T4, free   Essential hypertension     Blood pressure looks okay  continue the losartan hydrochlorothiazide      Diabetic neuropathy (HCC)     Restart Lyrica 100 mg twice a day she has failed gabapentin      Diabetes with neurologic complications ([place]) - Primary     She is in denial with regards to her diabetes mellitus she is not taking care of herself or taking her medicine as prescribed. She is asked to supposed to be on long-acting insulin as well as short acting with meals she is not on the  NovoLog. We'll recheck her A1c today she will continue her Lantus as well as the metformin 850 mg twice a day  Think her uncontrolled diabetes is contributing to a lot of her generalized issues including her  neuropathy or recurrent skin infections or fatigue weight issues  Recommend that she check her blood sugar 3 times a day      Relevant Orders   CBC with Differential/Platelet   Comprehensive metabolic panel   Hemoglobin A1c   Lipid panel    Other Visit Diagnoses    MRSA cellulitis         presumptive recurrence of MRSA sialitis I do not know that this is due to friction in the same region of the chest wall, [place] neg, she does have uncontrolled DM  Which makes clearing up infections more difficult. Bactrim DS  1 PO bid X 5 DAYS, as this IS  A mild infection also advised her not to unroofed the blister. I'm guessing her to  dermatology to have this evaluated she may need biopsy as this occurred 3 times in the same spot       Note: This dictation was prepared with Dragon dictation along with smaller phrase technology. Any transcriptional errors that result from this process are unintentional.

## 2015-05-28 LAB — LIPID PANEL
CHOL/HDL RATIO: 4.2 ratio (ref <=5.0)
Cholesterol: 201 mg/dL — ABNORMAL HIGH (ref 125–200)
HDL: 48 mg/dL (ref >=46)
LDL CALC: 122 mg/dL (ref <130)
Triglycerides: 157 mg/dL — ABNORMAL HIGH (ref <150)
VLDL: 31 mg/dL — AB (ref <30)

## 2015-05-28 LAB — COMPREHENSIVE METABOLIC PANEL
ALBUMIN: 4.1 g/dL (ref 3.6–5.1)
ALK PHOS: 61 U/L (ref 33–130)
ALT: 17 U/L (ref 6–29)
AST: 19 U/L (ref 10–35)
BILIRUBIN TOTAL: 0.4 mg/dL (ref 0.2–1.2)
BUN: 13 mg/dL (ref 7–25)
CALCIUM: 10.5 mg/dL — AB (ref 8.6–10.4)
CHLORIDE: 105 mmol/L (ref 98–110)
CO2: 25 mmol/L (ref 20–31)
Creat: 0.74 mg/dL (ref 0.50–0.99)
GLUCOSE: 194 mg/dL — AB (ref 70–99)
Potassium: 4.1 mmol/L (ref 3.5–5.3)
Sodium: 138 mmol/L (ref 135–146)
TOTAL PROTEIN: 7.9 g/dL (ref 6.1–8.1)

## 2015-05-29 ENCOUNTER — Other Ambulatory Visit: Payer: Self-pay | Admitting: *Deleted

## 2015-05-29 ENCOUNTER — Telehealth: Payer: Self-pay | Admitting: *Deleted

## 2015-05-29 MED ORDER — INSULIN ASPART 100 UNIT/ML FLEXPEN: 10.0000 [IU] | PEN_INJECTOR | Freq: Three times a day (TID) | SUBCUTANEOUS | Status: DC

## 2015-05-29 MED ORDER — ATORVASTATIN CALCIUM 10 MG PO TABS: 10.0000 mg | ORAL_TABLET | Freq: Every day | ORAL | Status: DC

## 2015-05-29 NOTE — Telephone Encounter (Signed)
Submitted [facility] referral thru [facility] for authorization to [facility] with authorization [phone]  Requesting provider: Lonell Grandchild [place],MD  Treating provider:Frederick Lupton,MD  Number of visits: 6  Start Date:06/05/15  End Date: 12/02/15  Dx: L03.90- Cellulitis,unspecified       B95.62- Methicillin resis staph infct causing diseases classed elswhr

## 2015-06-05 DIAGNOSIS — L309 Dermatitis, unspecified: Secondary | ICD-10-CM | POA: Diagnosis not present

## 2015-06-12 ENCOUNTER — Ambulatory Visit (INDEPENDENT_AMBULATORY_CARE_PROVIDER_SITE_OTHER): Payer: Commercial Managed Care - HMO | Admitting: Family Medicine

## 2015-06-12 ENCOUNTER — Encounter: Payer: Self-pay | Admitting: Family Medicine

## 2015-06-12 VITALS — BP 142/78 | HR 82 | Temp 97.8°F | Resp 16 | Ht 65.0 in | Wt 199.0 lb

## 2015-06-12 DIAGNOSIS — E1143 Type 2 diabetes mellitus with diabetic autonomic (poly)neuropathy: Secondary | ICD-10-CM | POA: Diagnosis not present

## 2015-06-12 DIAGNOSIS — E669 Obesity, unspecified: Secondary | ICD-10-CM

## 2015-06-12 NOTE — Patient Instructions (Signed)
No canned fruits  Pull out your nutrition information Continue the sliding scale insulin Continue the lantus twice a day  F/U Week of May 15th   Diabetes Mellitus and Food It is important for you to manage your blood sugar (glucose) level. Your blood glucose level can be greatly affected by what you eat. Eating healthier foods in the appropriate amounts throughout the day at about the same time each day will help you control your blood glucose level. It can also help slow or prevent worsening of your diabetes mellitus. Healthy eating may even help you improve the level of your blood pressure and reach or maintain a healthy weight.  General recommendations for healthful eating and cooking habits include:  Eating meals and snacks regularly. Avoid going long periods of time without eating to lose weight.  Eating a diet that consists mainly of plant-based foods, such as fruits, vegetables, nuts, legumes, and whole grains.  Using low-heat cooking methods, such as baking, instead of high-heat cooking methods, such as deep frying. Work with your dietitian to make sure you understand how to use the Nutrition Facts information on food labels. HOW CAN FOOD AFFECT ME? Carbohydrates Carbohydrates affect your blood glucose level more than any other type of food. Your dietitian will help you determine how many carbohydrates to eat at each meal and teach you how to count carbohydrates. Counting carbohydrates is important to keep your blood glucose at a healthy level, especially if you are using insulin or taking certain medicines for diabetes mellitus. Alcohol Alcohol can cause sudden decreases in blood glucose (hypoglycemia), especially if you use insulin or take certain medicines for diabetes mellitus. Hypoglycemia can be a life-threatening condition. Symptoms of hypoglycemia (sleepiness, dizziness, and disorientation) are similar to symptoms of having too much alcohol.  If your health care provider has  given you approval to drink alcohol, do so in moderation and use the following guidelines:  Women should not have more than one drink per day, and men should not have more than two drinks per day. One drink is equal to:  12 oz of beer.  5 oz of wine.  1 oz of hard liquor.  Do not drink on an empty stomach.  Keep yourself hydrated. Have water, diet soda, or unsweetened iced tea.  Regular soda, juice, and other mixers might contain a lot of carbohydrates and should be counted. WHAT FOODS ARE NOT RECOMMENDED? As you make food choices, it is important to remember that all foods are not the same. Some foods have fewer nutrients per serving than other foods, even though they might have the same number of calories or carbohydrates. It is difficult to get your body what it needs when you eat foods with fewer nutrients. Examples of foods that you should avoid that are high in calories and carbohydrates but low in nutrients include:  Trans fats (most processed foods list trans fats on the Nutrition Facts label).  Regular soda.  Juice.  Candy.  Sweets, such as cake, pie, doughnuts, and cookies.  Fried foods. WHAT FOODS CAN I EAT? Eat nutrient-rich foods, which will nourish your body and keep you healthy. The food you should eat also will depend on several factors, including:  The calories you need.  The medicines you take.  Your weight.  Your blood glucose level.  Your blood pressure level.  Your cholesterol level. You should eat a variety of foods, including:  Protein.  Lean cuts of meat.  Proteins low in saturated fats, such as  fish, egg whites, and beans. Avoid processed meats.  Fruits and vegetables.  Fruits and vegetables that may help control blood glucose levels, such as apples, mangoes, and yams.  Dairy products.  Choose fat-free or low-fat dairy products, such as milk, yogurt, and cheese.  Grains, bread, pasta, and rice.  Choose whole grain products, such  as multigrain bread, whole oats, and brown rice. These foods may help control blood pressure.  Fats.  Foods containing healthful fats, such as nuts, avocado, olive oil, canola oil, and fish. DOES EVERYONE WITH DIABETES MELLITUS HAVE THE SAME MEAL PLAN? Because every person with diabetes mellitus is different, there is not one meal plan that works for everyone. It is very important that you meet with a dietitian who will help you create a meal plan that is just right for you.   This information is not intended to replace advice given to you by your health care provider. Make sure you discuss any questions you have with your health care provider.   Document Released: 12/23/2004 Document Revised: 04/18/2014 Document Reviewed: 02/22/2013 Elsevier Interactive Patient Education [employer].

## 2015-06-12 NOTE — Assessment & Plan Note (Signed)
approx 15 minutes spent with pt > 50% on counseling on nutrition/DM

## 2015-06-12 NOTE — Progress Notes (Signed)
Patient ID: [name], female   DOB: September 24, 1950, 65 y.o.   MRN: [id number]    Subjective:    Patient ID: [name], female    DOB: July 25, 1950, 65 y.o.   MRN: [id number]  Patient presents for F/U Pt here for intermin f/u on DM, A1C showed uncontrolled diabetic status at 11.7%.  She was started back on Novolog with meal and continued on Lantus 45units BID and MTF. I advised 10 units with each meals but she started back on her previous SSI which was 15 +SSI   150-200 - 2 units,   201-250- 4 units  251-300- 6 units  301-350- 8 units  350+- 10 units  Her CBG  fastomng  111-205, few meal time readings range  107-156 , she had 1 episode where she felt hypoglycemic around 107, she did not have any lunch Discussed mealtimes, she has been trying to eat breakfast and lunch, often she would skip these or eat canned peaches or cottage cheese only.  - She was seen by dermatology for the reoccuring rash on breast- unknown cause  Review Of Systems:  GEN- denies fatigue, fever, weight loss,weakness, recent illness HEENT- denies eye drainage, change in vision, nasal discharge, CVS- denies chest pain, palpitations RESP- denies SOB, cough, wheeze ABD- denies N/V, change in stools, abd pain Neuro- denies headache, dizziness, syncope, seizure activity       Objective:    BP 142/78 mmHg  Pulse 82  Temp(Src) 97.8 F (36.6 C) (Oral)  Resp 16  Ht 5\' 5"  (1.651 m)  Wt 199 lb (90.266 kg)  BMI 33.12 kg/m2 GEN- NAD, alert and oriented x3 Skin- no open area or erythema at previous infection site on right chest wall        Assessment & Plan:      Problem List Items Addressed This Visit    Obesity   Diabetes with neurologic complications ([place]) - Primary      Note: This dictation was prepared with Dragon dictation along with smaller phrase technology. Any transcriptional errors that result from this process are unintentional.

## 2015-06-12 NOTE — Assessment & Plan Note (Signed)
Uncontrolled diabetic state but CBG much improved. Discussed importance of regular meals  She declines going back to diabetic nutritonist, discussed no canned fruits/SODA, junk food, avoid white carbs Continue lantus 45units BID and SSI RECHECK A1c in May

## 2015-06-23 ENCOUNTER — Ambulatory Visit (INDEPENDENT_AMBULATORY_CARE_PROVIDER_SITE_OTHER): Payer: Commercial Managed Care - HMO | Admitting: Family Medicine

## 2015-06-23 ENCOUNTER — Encounter: Payer: Self-pay | Admitting: Family Medicine

## 2015-06-23 VITALS — BP 132/78 | HR 64 | Temp 98.1°F | Resp 18 | Ht 65.0 in | Wt 202.0 lb

## 2015-06-23 DIAGNOSIS — N200 Calculus of kidney: Secondary | ICD-10-CM | POA: Diagnosis not present

## 2015-06-23 DIAGNOSIS — K5909 Other constipation: Secondary | ICD-10-CM

## 2015-06-23 DIAGNOSIS — R109 Unspecified abdominal pain: Secondary | ICD-10-CM | POA: Diagnosis not present

## 2015-06-23 DIAGNOSIS — K5900 Constipation, unspecified: Secondary | ICD-10-CM | POA: Diagnosis not present

## 2015-06-23 LAB — URINALYSIS, ROUTINE W REFLEX MICROSCOPIC
BILIRUBIN URINE: NEGATIVE
Ketones, ur: NEGATIVE
Leukocytes, UA: NEGATIVE
Nitrite: NEGATIVE
PH: 7 (ref 5.0–8.0)
SPECIFIC GRAVITY, URINE: 1.025 (ref 1.001–1.035)

## 2015-06-23 LAB — URINALYSIS, MICROSCOPIC ONLY
CASTS: NONE SEEN [LPF]
CRYSTALS: NONE SEEN [HPF]
Yeast: NONE SEEN [HPF]

## 2015-06-23 MED ORDER — TAMSULOSIN HCL 0.4 MG PO CAPS: 0.4000 mg | ORAL_CAPSULE | Freq: Every day | ORAL | Status: DC

## 2015-06-23 MED ORDER — LUBIPROSTONE 24 MCG PO CAPS: 24.0000 ug | ORAL_CAPSULE | Freq: Two times a day (BID) | ORAL | Status: DC

## 2015-06-23 NOTE — Patient Instructions (Signed)
Take the flomax every day Restart your pain medication Take the amitiza to see if this helps your bowels  Call if pain not better by Friday  F/U as previous

## 2015-06-23 NOTE — Assessment & Plan Note (Signed)
Chronic constipation, mix of opiod induced as well, her pain could be bowel related also possiblity of kidney stone Will give Amitiza BID - GIVEN SAMPLES, to see if this helps, call this week if not improved and KUB will be done

## 2015-06-23 NOTE — Progress Notes (Signed)
Patient ID: [name], female   DOB: 1951/01/05, 65 y.o.   MRN: [id number]   Subjective:    Patient ID: [name], female    DOB: Aug 29, 1950, 65 y.o.   MRN: [id number]  Patient presents for L Sided Pain and GI Pain Patient here with left flank pain and constipation. Sunday she started with left flank pain. She states it feels like her previous kidney stones. She did have a CT scan done in August 2016 which showed nonobstructing stones bilaterally. She denies any hematuria denies any dysuria. Actually states that she was not urinating as much when she stopped drinking enough water. She's also had problems with her bowels of the past couple weeks. She's been taking milk of magnesia and other over-the-counter supplements which has not helped very much. Last BM 4 days ago. She is on pain medication chronically therefore she tried to decrease the use of this over the past week to see if it will help with her bowels. She denies any nausea vomiting no fever.  Noted on review of her chart actually gave her Movantik back in August when she presented with similar findings she took the samples but otherwise did not continue any medication for her bowels  Review Of Systems:  GEN- denies fatigue, fever, weight loss,weakness, recent illness HEENT- denies eye drainage, change in vision, nasal discharge, CVS- denies chest pain, palpitations RESP- denies SOB, cough, wheeze ABD- denies N/V, +change in stools, +abd pain GU- denies dysuria, hematuria, dribbling, incontinence MSK- denies joint pain, muscle aches, injury Neuro- denies headache, dizziness, syncope, seizure activity       Objective:    BP 132/78 mmHg  Pulse 64  Temp(Src) 98.1 F (36.7 C) (Oral)  Resp 18  Ht 5\' 5"  (1.651 m)  Wt 202 lb (91.627 kg)  BMI 33.61 kg/m2 GEN- NAD, alert and oriented x3 HEENT- PERRL, EOMI, non injected sclera, pink conjunctiva, MMM, oropharynx clear CVS- RRR, no murmur RESP-CTAB ABD-NABS,soft,mild  TTP, Left side and TTP Left CVA/Flank, no guarding, no rebound  EXT- No edema Pulses- Radial, DP- 2+        Assessment & Plan:      Problem List Items Addressed This Visit    Chronic constipation    Chronic constipation, mix of opiod induced as well, her pain could be bowel related also possiblity of kidney stone Will give Amitiza BID - GIVEN SAMPLES, to see if this helps, call this week if not improved and KUB will be done       Other Visit Diagnoses    Kidney stone    -  Primary    known kidney stones, will give Flomax, some blood in urine, also overwhelming glucose and protein, she has been skipping her meal time insulin, discussed compliance with medications. Plan for imaging if no improvement She can use pain meds with the bowel medication    Relevant Orders    Urinalysis, Routine w reflex microscopic (not at [facility]) (Completed)       Note: This dictation was prepared with Dragon dictation along with smaller phrase technology. Any transcriptional errors that result from this process are unintentional.

## 2015-07-15 ENCOUNTER — Other Ambulatory Visit: Payer: Self-pay | Admitting: Family Medicine

## 2015-07-19 ENCOUNTER — Other Ambulatory Visit: Payer: Self-pay | Admitting: Family Medicine

## 2015-07-21 ENCOUNTER — Ambulatory Visit (INDEPENDENT_AMBULATORY_CARE_PROVIDER_SITE_OTHER): Payer: Commercial Managed Care - HMO | Admitting: Family Medicine

## 2015-07-21 ENCOUNTER — Encounter: Payer: Self-pay | Admitting: Family Medicine

## 2015-07-21 VITALS — BP 138/74 | HR 72 | Temp 98.1°F | Resp 18 | Ht 65.0 in | Wt 205.0 lb

## 2015-07-21 DIAGNOSIS — L600 Ingrowing nail: Secondary | ICD-10-CM

## 2015-07-21 DIAGNOSIS — IMO0002 Reserved for concepts with insufficient information to code with codable children: Secondary | ICD-10-CM

## 2015-07-21 DIAGNOSIS — E1143 Type 2 diabetes mellitus with diabetic autonomic (poly)neuropathy: Secondary | ICD-10-CM

## 2015-07-21 DIAGNOSIS — S61209A Unspecified open wound of unspecified finger without damage to nail, initial encounter: Secondary | ICD-10-CM | POA: Diagnosis not present

## 2015-07-21 MED ORDER — PREGABALIN 150 MG PO CAPS: 150.0000 mg | ORAL_CAPSULE | Freq: Two times a day (BID) | ORAL | Status: DC

## 2015-07-21 NOTE — Progress Notes (Signed)
Patient ID: [name], female   DOB: [birth year], 65 y.o.   MRN: [id number]   Subjective:    Patient ID: [name], female    DOB: 08-09-1950, 65 y.o.   MRN: [id number]  Patient presents for L Great Toe Pain and Medication Management Patient because her toenail was lifted up on the left side. She states it is fallen off before she is seen podiatry in the past but she needs a new referral. When she went to remove the Band-Aid the entire nail came off there was no discharge or was no bleeding she denies any pain. She does continue to have significant diabetic nerve pain. She states her blood sugars are coming down this morning it was 113 but she did not bring her meter with her. She's currently on Lyrica 100 mg twice a day she did not tolerate gabapentin.    Review Of Systems:  GEN- denies fatigue, fever, weight loss,weakness, recent illness HEENT- denies eye drainage, change in vision, nasal discharge, CVS- denies chest pain, palpitations RESP- denies SOB, cough, wheeze ABD- denies N/V, change in stools, abd pain GU- denies dysuria, hematuria, dribbling, incontinence MSK- +joint pain, muscle aches, injury Neuro- denies headache, dizziness, syncope, seizure activity       Objective:    BP 138/74 mmHg  Pulse 72  Temp(Src) 98.1 F (36.7 C) (Oral)  Resp 18  Ht 5\' 5"  (1.651 m)  Wt 205 lb (92.987 kg)  BMI 34.11 kg/m2 GEN- NAD, alert and oriented x3 Ext Left foot- Great toenail missing, nail plate thickened keratic skin, discoloration noted, NT  Right foot ingrown great toenail, thick nails, hammer toes  Pedal edema bilat  Pulse- 2+         Assessment & Plan:      Problem List Items Addressed This Visit    Diabetic neuropathy ([place])   Diabetes with neurologic complications ([place]) - Primary    Her diabetes is slowly improving. She will continue her Lantus 45 units twice a day and her sliding scale insulin and continue with metformin. I will refer her to podiatry  for further foot care. She will also continue continue the Lyrica which I will increase 250 mg twice a day to help control her diabetic neuropathy symptoms       Other Visit Diagnoses    Ingrown nail        Referral to podiatry for nail treatment, ? if the nail that fell off has some fungus on nailbed based on apperance. I would be cautious about terbinafine in her    Nail avulsion           Note: This dictation was prepared with Dragon dictation along with smaller phrase technology. Any transcriptional errors that result from this process are unintentional.

## 2015-07-21 NOTE — Patient Instructions (Signed)
Stay active  Referral to podiatry Lyrica increased to 150mg  twice a day  F/U Cancel appointments for April, CHANGE to [facility] May

## 2015-07-21 NOTE — Assessment & Plan Note (Signed)
Her diabetes is slowly improving. She will continue her Lantus 45 units twice a day and her sliding scale insulin and continue with metformin. I will refer her to podiatry for further foot care. She will also continue continue the Lyrica which I will increase 250 mg twice a day to help control her diabetic neuropathy symptoms

## 2015-07-27 ENCOUNTER — Ambulatory Visit: Payer: Commercial Managed Care - HMO | Admitting: Family Medicine

## 2015-08-13 ENCOUNTER — Ambulatory Visit (INDEPENDENT_AMBULATORY_CARE_PROVIDER_SITE_OTHER): Payer: Commercial Managed Care - HMO

## 2015-08-13 ENCOUNTER — Ambulatory Visit (INDEPENDENT_AMBULATORY_CARE_PROVIDER_SITE_OTHER): Payer: Commercial Managed Care - HMO | Admitting: Podiatry

## 2015-08-13 DIAGNOSIS — I739 Peripheral vascular disease, unspecified: Secondary | ICD-10-CM | POA: Diagnosis not present

## 2015-08-13 DIAGNOSIS — E119 Type 2 diabetes mellitus without complications: Secondary | ICD-10-CM

## 2015-08-13 DIAGNOSIS — E1142 Type 2 diabetes mellitus with diabetic polyneuropathy: Secondary | ICD-10-CM

## 2015-08-13 DIAGNOSIS — B351 Tinea unguium: Secondary | ICD-10-CM | POA: Diagnosis not present

## 2015-08-13 DIAGNOSIS — Z853 Personal history of malignant neoplasm of breast: Secondary | ICD-10-CM | POA: Diagnosis not present

## 2015-08-13 DIAGNOSIS — Z1231 Encounter for screening mammogram for malignant neoplasm of breast: Secondary | ICD-10-CM | POA: Diagnosis not present

## 2015-08-13 DIAGNOSIS — M79676 Pain in unspecified toe(s): Secondary | ICD-10-CM

## 2015-08-13 DIAGNOSIS — Z0189 Encounter for other specified special examinations: Secondary | ICD-10-CM

## 2015-08-13 LAB — HM MAMMOGRAPHY: HM Mammogram: NORMAL (ref 0–4)

## 2015-08-13 NOTE — Progress Notes (Signed)
   Subjective:    Patient ID: [name], female    DOB: [birth year], 65 y.o.   MRN: [id number]  HPI: She presents today with a many year history of diabetes which is poorly controlled. She relates that her last A1c was around 11 or 12. At this point she's concerned about her thick toenails and the pain in her feet. She denies any trauma but states that the pain seems to be progressing she also states that she is starting to lose her balance become unsteady. She's noticed weakness in her legs.    Review of Systems  Constitutional: Positive for activity change.  HENT: Positive for tinnitus.   Gastrointestinal: Positive for nausea, abdominal pain, constipation and abdominal distention.  Musculoskeletal: Positive for myalgias, back pain and arthralgias.  Neurological: Positive for headaches.  All other systems reviewed and are negative.      Objective:   Physical Exam: Vital signs are stable she is alert and oriented 3 pulses are palpable bilateral. Capillary fill time a bit delayed but feet are warm to the touch. Neurologic sensorium is moderately diminished per Semmes-Weinstein monofilament and deep tendon reflexes are intact bilateral. Muscle strength is normal bilateral. Orthopedic evaluation demonstrates mild pes planus mild flexible hammertoe deformities and HAV deformities but are asymptomatic. Cutaneous evaluation demonstrates thick yellow dystrophic clinically mycotic and painful toenails bilateral.    Assessment & Plan:  Severe diabetic peripheral neuropathy with thick yellow dystrophic clinically mycotic nails.  Plan: Debridement of toenails 1 through 5 bilateral cover service secondary to pain. And when he see about getting her into a pair of diabetic shoes.

## 2015-08-14 ENCOUNTER — Ambulatory Visit: Payer: Commercial Managed Care - HMO | Admitting: Family Medicine

## 2015-08-20 ENCOUNTER — Other Ambulatory Visit: Payer: Self-pay | Admitting: *Deleted

## 2015-08-20 MED ORDER — METFORMIN HCL 850 MG PO TABS: 850.0000 mg | ORAL_TABLET | Freq: Two times a day (BID) | ORAL | Status: DC

## 2015-08-20 MED ORDER — MELOXICAM 15 MG PO TABS
ORAL_TABLET | ORAL | Status: DC
Start: 2015-08-20 — End: 2016-02-05

## 2015-08-20 MED ORDER — LANCET DEVICES MISC: Status: DC

## 2015-08-20 MED ORDER — LANCETS MISC: Status: DC

## 2015-08-20 MED ORDER — BLOOD GLUCOSE TEST VI STRP: ORAL_STRIP | Status: DC

## 2015-08-20 MED ORDER — FLUTICASONE PROPIONATE 50 MCG/ACT NA SUSP: 2.0000 | Freq: Every day | NASAL | Status: DC

## 2015-08-20 MED ORDER — BLOOD GLUCOSE SYSTEM PAK KIT: PACK | Status: DC

## 2015-08-20 MED ORDER — TRUE METRIX LEVEL 2 NORMAL VI SOLN: Status: DC

## 2015-08-20 MED ORDER — ONDANSETRON HCL 4 MG PO TABS: 4.0000 mg | ORAL_TABLET | Freq: Four times a day (QID) | ORAL | Status: DC

## 2015-08-20 MED ORDER — ATORVASTATIN CALCIUM 10 MG PO TABS: 10.0000 mg | ORAL_TABLET | Freq: Every day | ORAL | Status: DC

## 2015-08-20 MED ORDER — ALENDRONATE SODIUM 70 MG PO TABS: 70.0000 mg | ORAL_TABLET | ORAL | Status: DC

## 2015-08-20 NOTE — Telephone Encounter (Signed)
Received fax requesting refill on routine medications.   Refill appropriate and filled per protocol.

## 2015-08-20 NOTE — Telephone Encounter (Signed)
Received fax requesting refill on DM supplies.   Refill appropriate and filled per protocol.  

## 2015-08-29 ENCOUNTER — Encounter (HOSPITAL_COMMUNITY): Payer: Self-pay

## 2015-08-29 DIAGNOSIS — M1990 Unspecified osteoarthritis, unspecified site: Secondary | ICD-10-CM | POA: Insufficient documentation

## 2015-08-29 DIAGNOSIS — Z853 Personal history of malignant neoplasm of breast: Secondary | ICD-10-CM | POA: Insufficient documentation

## 2015-08-29 DIAGNOSIS — R0602 Shortness of breath: Secondary | ICD-10-CM | POA: Diagnosis not present

## 2015-08-29 DIAGNOSIS — E039 Hypothyroidism, unspecified: Secondary | ICD-10-CM | POA: Insufficient documentation

## 2015-08-29 DIAGNOSIS — Z7982 Long term (current) use of aspirin: Secondary | ICD-10-CM | POA: Insufficient documentation

## 2015-08-29 DIAGNOSIS — I10 Essential (primary) hypertension: Secondary | ICD-10-CM | POA: Diagnosis not present

## 2015-08-29 DIAGNOSIS — R0789 Other chest pain: Secondary | ICD-10-CM | POA: Diagnosis not present

## 2015-08-29 DIAGNOSIS — Z794 Long term (current) use of insulin: Secondary | ICD-10-CM | POA: Diagnosis not present

## 2015-08-29 DIAGNOSIS — E1140 Type 2 diabetes mellitus with diabetic neuropathy, unspecified: Secondary | ICD-10-CM | POA: Diagnosis not present

## 2015-08-29 DIAGNOSIS — Z79899 Other long term (current) drug therapy: Secondary | ICD-10-CM | POA: Insufficient documentation

## 2015-08-29 NOTE — ED Notes (Signed)
Onset 8:30p chest pain.  Pt took muscle relaxer at 9pm with no relief.  Short of breath when taking deep breaths.  NO sweating, nausea.

## 2015-08-30 ENCOUNTER — Emergency Department (HOSPITAL_COMMUNITY): Payer: Commercial Managed Care - HMO

## 2015-08-30 ENCOUNTER — Emergency Department (HOSPITAL_COMMUNITY)
Admission: EM | Admit: 2015-08-30 | Discharge: 2015-08-30 | Disposition: A | Discharge status: Home / Self Care | Payer: Commercial Managed Care - HMO | Attending: Emergency Medicine | Admitting: Emergency Medicine

## 2015-08-30 DIAGNOSIS — R079 Chest pain, unspecified: Secondary | ICD-10-CM

## 2015-08-30 DIAGNOSIS — R0602 Shortness of breath: Secondary | ICD-10-CM | POA: Diagnosis not present

## 2015-08-30 LAB — I-STAT TROPONIN, ED
TROPONIN I, POC: 0.02 ng/mL (ref 0.00–0.08)
TROPONIN I, POC: 0.01 ng/mL (ref 0.00–0.08)
Troponin i, poc: 0.02 ng/mL (ref 0.00–0.08)

## 2015-08-30 LAB — CBC
HEMATOCRIT: 44.9 % (ref 36.0–46.0)
HEMOGLOBIN: 14.4 g/dL (ref 12.0–15.0)
MCH: 28.7 pg (ref 26.0–34.0)
MCHC: 32.1 g/dL (ref 30.0–36.0)
MCV: 89.6 fL (ref 78.0–100.0)
Platelets: 266 10*3/uL (ref 150–400)
RBC: 5.01 MIL/uL (ref 3.87–5.11)
RDW: 13.7 % (ref 11.5–15.5)
WBC: 7.9 10*3/uL (ref 4.0–10.5)

## 2015-08-30 LAB — BASIC METABOLIC PANEL
ANION GAP: 12 (ref 5–15)
BUN: 17 mg/dL (ref 6–20)
CHLORIDE: 105 mmol/L (ref 101–111)
CO2: 25 mmol/L (ref 22–32)
Calcium: 11.2 mg/dL — ABNORMAL HIGH (ref 8.9–10.3)
Creatinine, Ser: 0.93 mg/dL (ref 0.44–1.00)
GFR calc non Af Amer: >60 mL/min (ref >60)
Glucose, Bld: 147 mg/dL — ABNORMAL HIGH (ref 65–99)
POTASSIUM: 4 mmol/L (ref 3.5–5.1)
SODIUM: 142 mmol/L (ref 135–145)

## 2015-08-30 LAB — BRAIN NATRIURETIC PEPTIDE: B NATRIURETIC PEPTIDE 5: 21.9 pg/mL (ref 0.0–100.0)

## 2015-08-30 LAB — D-DIMER, QUANTITATIVE (NOT AT ARMC): D DIMER QUANT: 1.05 ug{FEU}/mL — AB (ref 0.00–0.50)

## 2015-08-30 MED ORDER — IOPAMIDOL (ISOVUE-370) INJECTION 76%
INTRAVENOUS | Status: AC
  Filled 2015-08-30: qty 100

## 2015-08-30 MED ORDER — DOCUSATE SODIUM 100 MG PO CAPS: 100.0000 mg | ORAL_CAPSULE | Freq: Two times a day (BID) | ORAL | Status: DC

## 2015-08-30 MED ORDER — FENTANYL CITRATE (PF) 100 MCG/2ML IJ SOLN
50.0000 ug | Freq: Once | INTRAMUSCULAR | Status: AC
  Administered 2015-08-30: 50 ug via INTRAVENOUS
  Filled 2015-08-30: qty 2

## 2015-08-30 MED ORDER — OXYCODONE-ACETAMINOPHEN 5-325 MG PO TABS
1.0000 | ORAL_TABLET | Freq: Once | ORAL | Status: AC
  Administered 2015-08-30: 1 via ORAL
  Filled 2015-08-30: qty 1

## 2015-08-30 MED ORDER — OXYCODONE-ACETAMINOPHEN 5-325 MG PO TABS: 1.0000 | ORAL_TABLET | Freq: Once | ORAL | Status: DC

## 2015-08-30 MED ORDER — OXYCODONE-ACETAMINOPHEN 5-325 MG PO TABS: 1.0000 | ORAL_TABLET | Freq: Four times a day (QID) | ORAL | Status: DC | PRN

## 2015-08-30 MED ORDER — ONDANSETRON 4 MG PO TBDP
4.0000 mg | ORAL_TABLET | Freq: Once | ORAL | Status: AC
  Administered 2015-08-30: 4 mg via ORAL
  Filled 2015-08-30: qty 1

## 2015-08-30 MED ORDER — MORPHINE SULFATE (PF) 4 MG/ML IV SOLN
4.0000 mg | Freq: Once | INTRAVENOUS | Status: AC
Start: 2015-08-30 — End: 2015-08-30
  Administered 2015-08-30: 4 mg via INTRAVENOUS
  Filled 2015-08-30: qty 1

## 2015-08-30 MED ORDER — ONDANSETRON 4 MG PO TBDP: 4.0000 mg | ORAL_TABLET | Freq: Three times a day (TID) | ORAL | Status: DC | PRN

## 2015-08-30 NOTE — ED Notes (Signed)
Pt is difficult stick and new troponin to be drawn by lab.

## 2015-08-30 NOTE — ED Provider Notes (Signed)
By signing my name below, I, [name], attest that this documentation has been prepared under the direction and in the presence of [place], DO. Electronically Signed: [name], ED Scribe. 08/30/2015. 2:28 AM.  TIME SEEN: 2:14 AM   CHIEF COMPLAINT: Chest pain  HPI:  HPI Comments: [name] is a 65 y.o. female with a history of GERD, hypothyroidism, peripheral neuropathy, cardiomyopathy, HTN, HLD, type II DM, right breast cancer (in 2003 and 2005) who presents to the Emergency Department complaining of constant, severe, tight, left-sided chest pain radiating to her neck and epigastric abdomen that began 6 hours ago, at 8 PM, while patient was sitting at rest. She also complains of associated SOB, nausea, and lightheadedness. She notes unchanged bilateral ankle swelling that has been ongoing for "a while." Patient denies any injury to her neck, chest, back; she also denies any recent heavy lifting. She states she had similar symptoms about 2 years ago and was treated with a muscle relaxant at that time. However, patient reports she had taken a muscle relaxant at 9 PM, about 5 hours ago, with no relief. She states her pain is worse with movement. Nonexertional or pleuritic. Patient denies a history of smoking. She denies a history of MI, CHF, PE, or DVT. Patient states she did have a cardiac stress test done "a while ago." She denies diaphoresis, fever, or cough.  PCP: Odette Fraction, MD   ROS: See HPI Constitutional: no fever  Eyes: no drainage  ENT: no runny nose   Cardiovascular: chest pain  Resp: SOB  GI: no vomiting GU: no dysuria Integumentary: no rash  Allergy: no hives  Musculoskeletal: no leg swelling  Neurological: no slurred speech ROS otherwise negative  PAST MEDICAL HISTORY/PAST SURGICAL HISTORY:  Past Medical History  Diagnosis Date  . GERD (gastroesophageal reflux disease)   . Hypothyroidism   . Peripheral neuropathy ([place])   . Cardiomyopathy   .  Chest pain 06/2005    Hospitalized, dystolic dysfunction  . Sciatica   . Type II diabetes mellitus ([place])   . PONV (postoperative nausea and vomiting)   . H/O hiatal hernia   . Migraines     "it's been a long time since I've had one" (03/20/2012)  . Osteoarthritis   . Arthritis     "all over; mostly in my back" (03/20/2012)  . Chronic lower back pain     "have 2 herniated discs; going to have to have a fusion" (03/20/2012)  . Breast cancer ([place])     "right" (03/20/2012)  . Family history of anesthesia complication     "father has nausea and vomiting"  . Irritable bowel syndrome   . Hypertension     "patient states never had HTN, takes Hyzaar for heart  . History of right mastectomy   . Anginal pain ([place]) 03/19/2012    saw Dr. Cathie Olden .. she thinks its more related to stomach issues  . History of kidney stones     MEDICATIONS:  Prior to Admission medications   Medication Sig Start Date End Date Taking? Authorizing Provider  alendronate (FOSAMAX) 70 MG tablet Take 1 tablet (70 mg total) by mouth every Monday. Reported on 05/27/2015 08/20/15   Susy Frizzle, MD  aspirin 81 MG tablet Take 81 mg by mouth daily.    Historical Provider, MD  atorvastatin (LIPITOR) 10 MG tablet Take 1 tablet (10 mg total) by mouth daily. 08/20/15   Susy Frizzle, MD  Blood Glucose Calibration (TRUE METRIX  LEVEL 2) Normal SOLN Use as directed. 08/20/15   Warren T Pickard, MD  Blood Glucose Monitoring Suppl (BLOOD GLUCOSE SYSTEM PAK) KIT Use as directed to monitor FSBS 3x daily. Dx: E11.9. Please dispense [employer] True Metrix. 08/20/15   Warren T Pickard, MD  Cholecalciferol (VITAMIN D3) 5000 UNITS CAPS Take 5,000 Units by mouth 2 (two) times daily.    Historical Provider, MD  fluticasone (FLONASE) 50 MCG/ACT nasal spray Place 2 sprays into both nostrils daily. 08/20/15   Warren T Pickard, MD  Glucose Blood (BLOOD GLUCOSE TEST STRIPS) STRP Use as directed to monitor FSBS 3x daily. Dx: E11.9. Please dispense  [employer] True Metrix. 08/20/15   Warren T Pickard, MD  HYDROcodone-acetaminophen (NORCO) 7.5-325 MG per tablet TAKE 1 TO 2 TABLETS BY MOUTH EVERY 4 TO 6 HOURS AS NEEDED FOR PAIN MAXIMUM 8 TABLETS PER DAY 09/19/14   Historical Provider, MD  insulin aspart (NOVOLOG) 100 UNIT/ML FlexPen Inject 10 Units into the skin 3 (three) times daily with meals. 05/29/15   Kawanta F [place], MD  Insulin Pen Needle (PEN NEEDLES 5/16") 30G X 8 MM MISC Use to inject insulin 5x daily. 12/31/13   Warren T Pickard, MD  Lancet Devices MISC Use as directed to monitor FSBS 3x daily. Dx: E11.9. Please dispense TruePlus Ultra Thin. 08/20/15   Warren T Pickard, MD  Lancets MISC Use as directed to monitor FSBS 3x daily. Dx: E11.9. Please dispense TruePlus Ultra Thin. 08/20/15   Warren T Pickard, MD  LANTUS SOLOSTAR 100 UNIT/ML Solostar Pen INJECT 45 UNITS INTO THE SKIN 2 (TWO) TIMES DAILY. 07/15/15   Kawanta F [place], MD  levothyroxine (SYNTHROID, LEVOTHROID) 100 MCG tablet Take 1 tablet (100 mcg total) by mouth daily. 10/29/13   Warren T Pickard, MD  losartan-hydrochlorothiazide (HYZAAR) 50-12.5 MG per tablet Take 1 tablet by mouth daily. 10/29/13   Warren T Pickard, MD  lubiprostone (AMITIZA) 24 MCG capsule Take 1 capsule (24 mcg total) by mouth 2 (two) times daily with a meal. 06/23/15   Kawanta F [place], MD  LYRICA 100 MG capsule Take 100 mg by mouth 2 (two) times daily. 05/27/15   Historical Provider, MD  meloxicam (MOBIC) 15 MG tablet 1 TABLET DAILY AS NEEDED 08/20/15   Warren T Pickard, MD  metFORMIN (GLUCOPHAGE) 850 MG tablet Take 1 tablet (850 mg total) by mouth 2 (two) times daily with a meal. 08/20/15   Warren T Pickard, MD  Misc Natural Products (LAXATIVE FORMULA) TABS Take 1 tablet by mouth as needed (for BM).    Historical Provider, MD  ondansetron (ZOFRAN) 4 MG tablet Take 1 tablet (4 mg total) by mouth every 6 (six) hours. 08/20/15   Warren T Pickard, MD  pregabalin (LYRICA) 150 MG capsule Take 1 capsule (150 mg total) by mouth 2  (two) times daily. 07/21/15   Kawanta F [place], MD  tamsulosin (FLOMAX) 0.4 MG CAPS capsule Take 1 capsule (0.4 mg total) by mouth daily. 06/23/15   Kawanta F , MD    ALLERGIES:  Allergies  Allergen Reactions  . Dilaudid [Hydromorphone Hcl] Nausea And Vomiting  . Adhesive [Tape] Rash  . Ampicillin Rash    REACTION: Rash and welts on her hands when she took it in the hospital  . Penicillins Rash    SOCIAL HISTORY:  Social History  Substance Use Topics  . Smoking status: Never Smoker   . Smokeless tobacco: Never Used  . Alcohol Use: No    FAMILY HISTORY: Family History  Problem Relation   Age of Onset  . Hypertension Mother   . Diabetes Father   . Hypertension Father   . Hypertension Sister   . Diabetes Other     Family Hx. of Diabetes  . Hypertension Other     Family History of HTN  . Hypertension Brother   . Hypertension Brother   . Diabetes Brother     EXAM: BP 179/76 mmHg  Pulse 87  Temp(Src) 99.1 F (37.3 C) (Oral)  Resp 16  SpO2 96% CONSTITUTIONAL: Alert and oriented and responds appropriately to questions. Elderly, appears uncomfortable, afebrile, repeat temperature 98.1 HEAD: Normocephalic EYES: Conjunctivae clear, PERRL ENT: normal nose; no rhinorrhea; moist mucous membranes NECK: Supple, no meningismus, no LAD; tender to palpation over her left trapezius muscle with some tightening, no JVD, no midline spinal tenderness or step-off or deformity CARD: RRR; S1 and S2 appreciated; no murmurs, no clicks, no rubs, no gallops CHEST:  Tender to palpation over the left chest wall without crepitus, ecchymosis or deformity. No flail chest. No rash. Status post right-sided mastectomy. RESP: Normal chest excursion without splinting or tachypnea; breath sounds clear and equal bilaterally; no wheezes, no rhonchi, no rales, no hypoxia or respiratory distress, speaking full sentences ABD/GI: Normal bowel sounds; non-distended; soft, tender to palpation in the epigastric  region, no rebound, no guarding, no peritoneal signs BACK:  The back appears normal and is non-tender to palpation, there is no CVA tenderness EXT: Normal ROM in all joints; non-tender to palpation; no edema; normal capillary refill; no cyanosis, no calf tenderness or swelling; 2+ radial and DP pulses bilaterally, extremity is warm and well-perfused, compartment are soft SKIN: Normal color for age and race; warm; no rash NEURO: Moves all extremities equally, sensation to light touch intact diffusely, cranial nerves II through XII intact PSYCH: The patient's mood and manner are appropriate. Grooming and personal hygiene are appropriate.  MEDICAL DECISION MAKING: Patient here with what seems to be atypical chest pain. She does have risk factors for ACS as well as pulmonary embolus the pain seems to be worse with palpation and likely musculoskeletal. First troponin is negative and symptoms started at 8 PM. Will repeat a second troponin and 3 hours from the first. Her EKG shows no ischemic abnormality.  Will obtain d dimer.  Will give pain meds.  Her chest x-ray is clear. She does not appear volume overloaded on exam. Denies any infectious symptoms.  ED PROGRESS: 4:50 AM  Pt's second troponin is negative. D-dimer elevated. Will proceed with CT scan to rule out pulmonary embolus.   7:30 AM  Pt is stable. She is still awaiting her CT scan given high acuity in the emergency department. We'll repeat a third troponin which will be almost 12 hours after onset of symptoms. I feel if there troponin negative, CT unremarkable, patient can be discharged. My suspicion is that this is musculoskeletal in nature. Will discharge home with pain medication. Patient has been updated with this plan. She does have a PCP for follow-up.  Signed out to Dr. Vanita Panda to follow up on third troponin, CT scan.       EKG Interpretation  Date/Time:  Saturday Aug 29 2015 23:16:55 EDT Ventricular Rate:  77 PR Interval:  178 QRS  Duration: 92 QT Interval:  382 QTC Calculation: 432 R Axis:   -29 Text Interpretation:  Normal sinus rhythm Left ventricular hypertrophy Abnormal ECG No significant change since last tracing Confirmed by Doriana Mazurkiewicz,  DO, Zanyiah Posten ([id number]) on 08/30/2015 2:00:41 AM  I personally performed the services described in this documentation, which was scribed in my presence. The recorded information has been reviewed and is accurate.   Kristen N Ward, DO 08/30/15 0732 

## 2015-08-30 NOTE — Discharge Instructions (Signed)

## 2015-08-30 NOTE — ED Notes (Signed)
Pt strateas she understgands instructions. Home via w/c with family.

## 2015-08-30 NOTE — ED Notes (Signed)
Pt returns from CT.

## 2015-08-30 NOTE — ED Notes (Signed)
Pt up to bedpan for approx 200 cc urine

## 2015-08-31 ENCOUNTER — Emergency Department (HOSPITAL_BASED_OUTPATIENT_CLINIC_OR_DEPARTMENT_OTHER)
Admission: EM | Admit: 2015-08-31 | Discharge: 2015-08-31 | Disposition: A | Discharge status: Home / Self Care | Payer: Commercial Managed Care - HMO | Attending: Emergency Medicine | Admitting: Emergency Medicine

## 2015-08-31 ENCOUNTER — Encounter (HOSPITAL_BASED_OUTPATIENT_CLINIC_OR_DEPARTMENT_OTHER): Payer: Self-pay | Admitting: *Deleted

## 2015-08-31 ENCOUNTER — Telehealth: Payer: Self-pay | Admitting: Family Medicine

## 2015-08-31 DIAGNOSIS — M1990 Unspecified osteoarthritis, unspecified site: Secondary | ICD-10-CM | POA: Diagnosis not present

## 2015-08-31 DIAGNOSIS — R079 Chest pain, unspecified: Secondary | ICD-10-CM | POA: Diagnosis not present

## 2015-08-31 DIAGNOSIS — E039 Hypothyroidism, unspecified: Secondary | ICD-10-CM | POA: Diagnosis not present

## 2015-08-31 DIAGNOSIS — Z853 Personal history of malignant neoplasm of breast: Secondary | ICD-10-CM | POA: Insufficient documentation

## 2015-08-31 DIAGNOSIS — Z7984 Long term (current) use of oral hypoglycemic drugs: Secondary | ICD-10-CM | POA: Diagnosis not present

## 2015-08-31 DIAGNOSIS — I10 Essential (primary) hypertension: Secondary | ICD-10-CM | POA: Diagnosis not present

## 2015-08-31 DIAGNOSIS — Z79899 Other long term (current) drug therapy: Secondary | ICD-10-CM | POA: Diagnosis not present

## 2015-08-31 DIAGNOSIS — Z7982 Long term (current) use of aspirin: Secondary | ICD-10-CM | POA: Insufficient documentation

## 2015-08-31 DIAGNOSIS — Z794 Long term (current) use of insulin: Secondary | ICD-10-CM | POA: Diagnosis not present

## 2015-08-31 DIAGNOSIS — R0789 Other chest pain: Secondary | ICD-10-CM | POA: Diagnosis not present

## 2015-08-31 DIAGNOSIS — E119 Type 2 diabetes mellitus without complications: Secondary | ICD-10-CM | POA: Insufficient documentation

## 2015-08-31 LAB — CBC WITH DIFFERENTIAL/PLATELET
BASOS ABS: 0.1 10*3/uL (ref 0.0–0.1)
BASOS PCT: 1 %
Eosinophils Absolute: 0.3 10*3/uL (ref 0.0–0.7)
Eosinophils Relative: 3 %
HEMATOCRIT: 42.3 % (ref 36.0–46.0)
HEMOGLOBIN: 13.5 g/dL (ref 12.0–15.0)
LYMPHS PCT: 29 %
Lymphs Abs: 2.5 10*3/uL (ref 0.7–4.0)
MCH: 29.2 pg (ref 26.0–34.0)
MCHC: 31.9 g/dL (ref 30.0–36.0)
MCV: 91.6 fL (ref 78.0–100.0)
MONO ABS: 0.7 10*3/uL (ref 0.1–1.0)
Monocytes Relative: 8 %
NEUTROS ABS: 5.1 10*3/uL (ref 1.7–7.7)
NEUTROS PCT: 59 %
Platelets: 251 10*3/uL (ref 150–400)
RBC: 4.62 MIL/uL (ref 3.87–5.11)
RDW: 13.9 % (ref 11.5–15.5)
WBC: 8.6 10*3/uL (ref 4.0–10.5)

## 2015-08-31 LAB — BASIC METABOLIC PANEL
ANION GAP: 7 (ref 5–15)
BUN: 16 mg/dL (ref 6–20)
CHLORIDE: 106 mmol/L (ref 101–111)
CO2: 26 mmol/L (ref 22–32)
Calcium: 9.7 mg/dL (ref 8.9–10.3)
Creatinine, Ser: 0.76 mg/dL (ref 0.44–1.00)
GFR calc non Af Amer: >60 mL/min (ref >60)
GLUCOSE: 208 mg/dL — AB (ref 65–99)
POTASSIUM: 3.9 mmol/L (ref 3.5–5.1)
Sodium: 139 mmol/L (ref 135–145)

## 2015-08-31 LAB — TROPONIN I

## 2015-08-31 MED ORDER — DEXAMETHASONE SODIUM PHOSPHATE 10 MG/ML IJ SOLN
10.0000 mg | Freq: Once | INTRAMUSCULAR | Status: AC
  Administered 2015-08-31: 10 mg via INTRAVENOUS

## 2015-08-31 MED ORDER — DIAZEPAM 5 MG PO TABS
5.0000 mg | ORAL_TABLET | Freq: Once | ORAL | Status: AC
  Administered 2015-08-31: 5 mg via ORAL
  Filled 2015-08-31: qty 1

## 2015-08-31 MED ORDER — NAPROXEN 250 MG PO TABS
500.0000 mg | ORAL_TABLET | Freq: Once | ORAL | Status: AC
  Administered 2015-08-31: 500 mg via ORAL
  Filled 2015-08-31: qty 2

## 2015-08-31 MED ORDER — DEXAMETHASONE SODIUM PHOSPHATE 10 MG/ML IJ SOLN
10.0000 mg | Freq: Once | INTRAMUSCULAR | Status: DC
  Filled 2015-08-31: qty 1

## 2015-08-31 MED ORDER — NAPROXEN 500 MG PO TABS: 500.0000 mg | ORAL_TABLET | Freq: Two times a day (BID) | ORAL | Status: DC

## 2015-08-31 MED ORDER — DIAZEPAM 2 MG PO TABS: 2.0000 mg | ORAL_TABLET | Freq: Two times a day (BID) | ORAL | Status: DC

## 2015-08-31 NOTE — ED Provider Notes (Signed)
CSN: [id number]     Arrival date & time 08/31/15  1552 History   First MD Initiated Contact with Patient 08/31/15 1733     Chief Complaint  Patient presents with  . Chest Pain   HPI   [name] is a 65 y.o. female PMH significant for DM, HTN, OA, GERD, right breast cancer (in 2003 and 2005) presenting with a 2 day history of constant, severe, tight, left-sided chest pain that radiates to her neck and epigastric abdomen. Her pain is worse with movement and is nonexertional and isn't pleuritic. She denies a history of MI, CHF, PE, or DVT. She denies any fevers, chills, cough, N/V. She was seen on 5/21 for the same symptoms, had a negative ACS (troponin x 3), PE (CTA chest) workup.   Past Medical History  Diagnosis Date  . GERD (gastroesophageal reflux disease)   . Hypothyroidism   . Peripheral neuropathy ([place])   . Cardiomyopathy   . Chest pain 06/2005    Hospitalized, dystolic dysfunction  . Sciatica   . Type II diabetes mellitus ([place])   . PONV (postoperative nausea and vomiting)   . H/O hiatal hernia   . Migraines     "it's been a long time since I've had one" (03/20/2012)  . Osteoarthritis   . Arthritis     "all over; mostly in my back" (03/20/2012)  . Chronic lower back pain     "have 2 herniated discs; going to have to have a fusion" (03/20/2012)  . Breast cancer ([place])     "right" (03/20/2012)  . Family history of anesthesia complication     "father has nausea and vomiting"  . Irritable bowel syndrome   . Hypertension     "patient states never had HTN, takes Hyzaar for heart  . History of right mastectomy   . Anginal pain ([place]) 03/19/2012    saw Dr. Cathie Olden .. she thinks its more related to stomach issues  . History of kidney stones    Past Surgical History  Procedure Laterality Date  . Abdominal hysterectomy  1993  . Total knee arthroplasty  2000    Right  . Mastectomy with axillary lymph node dissection  12/2003    "right" (03/20/2012)  . Thyroidectomy  1977     Right  . Posterior cervical fusion/foraminotomy  2001  . Shoulder arthroscopy w/ rotator cuff repair  2003    Left  . Cardiac catheterization  ? 2005 / 2007  . Adenosin cardiolite  07/2005    (+) wall motion abnormality  . Esophagogastroduodenoscopy  2005    GERD  . [place] of abdomen  07/2005    Fatty liver / kidney stones  . Ct abdomen and pelvis  02/2010    Same  . Knee arthroscopy  1990's    "right" (03/20/2012)  . Axillary lymph node dissection  2003    squamous cell cancer  . Back surgery    . Lumbar laminectomy/decompression microdiscectomy N/A 02/04/2014    Procedure: RIGHT L2-3 MICRODISCECTOMY ;  Surgeon: Jessy Oto, MD;  Location: [facility];  Service: Orthopedics;  Laterality: N/A;  . Lumbar fusion N/A 08/22/2014    Procedure: Right L2-3 and right L1-2 transforaminal lumbar interbody fusion with pedicle screws, rods, sleeves, cages, local bone graft, Vivigen, cancellous chips;  Surgeon: Jessy Oto, MD;  Location: [facility];  Service: Orthopedics;  Laterality: N/A;   Family History  Problem Relation Age of Onset  . Hypertension Mother   . Diabetes Father   .  Hypertension Father   . Hypertension Sister   . Diabetes Other     Family Hx. of Diabetes  . Hypertension Other     Family History of HTN  . Hypertension Brother   . Hypertension Brother   . Diabetes Brother    Social History  Substance Use Topics  . Smoking status: Never Smoker   . Smokeless tobacco: Never Used  . Alcohol Use: No   OB History    No data available     Review of Systems  Ten systems are reviewed and are negative for acute change except as noted in the HPI  Allergies  Dilaudid; Adhesive; Ampicillin; and Penicillins  Home Medications   Prior to Admission medications   Medication Sig Start Date End Date Taking? Authorizing Provider  alendronate (FOSAMAX) 70 MG tablet Take 1 tablet (70 mg total) by mouth every Monday. Reported on 05/27/2015 08/20/15   Susy Frizzle, MD  aspirin 81 MG  tablet Take 81 mg by mouth daily.    Historical Provider, MD  atorvastatin (LIPITOR) 10 MG tablet Take 1 tablet (10 mg total) by mouth daily. 08/20/15   Susy Frizzle, MD  Blood Glucose Calibration (TRUE METRIX LEVEL 2) Normal SOLN Use as directed. 08/20/15   Susy Frizzle, MD  Blood Glucose Monitoring Suppl (BLOOD GLUCOSE SYSTEM PAK) KIT Use as directed to monitor FSBS 3x daily. Dx: E11.9. Please dispense [employer] True Metrix. 08/20/15   Susy Frizzle, MD  Cholecalciferol (VITAMIN D3) 5000 UNITS CAPS Take 5,000 Units by mouth 2 (two) times daily.    Historical Provider, MD  docusate sodium (COLACE) 100 MG capsule Take 1 capsule (100 mg total) by mouth every 12 (twelve) hours. 08/30/15   Kristen N Ward, DO  fluticasone (FLONASE) 50 MCG/ACT nasal spray Place 2 sprays into both nostrils daily. 08/20/15   Susy Frizzle, MD  Glucose Blood (BLOOD GLUCOSE TEST STRIPS) STRP Use as directed to monitor FSBS 3x daily. Dx: E11.9. Please dispense [employer] True Metrix. 08/20/15   Susy Frizzle, MD  GLYXAMBI 25-5 MG TABS Take 1 tablet by mouth daily. 08/27/15   Historical Provider, MD  HYDROcodone-acetaminophen (NORCO) 7.5-325 MG per tablet TAKE 1 TO 2 TABLETS BY MOUTH EVERY 4 TO 6 HOURS AS NEEDED FOR PAIN MAXIMUM 8 TABLETS PER DAY 09/19/14   Historical Provider, MD  insulin aspart (NOVOLOG) 100 UNIT/ML FlexPen Inject 10 Units into the skin 3 (three) times daily with meals. 05/29/15   Alycia Rossetti, MD  Insulin Pen Needle (PEN NEEDLES 5/16") 30G X 8 MM MISC Use to inject insulin 5x daily. 12/31/13   Susy Frizzle, MD  Lancet Devices MISC Use as directed to monitor FSBS 3x daily. Dx: E11.9. Please dispense TruePlus Ultra Thin. 08/20/15   Susy Frizzle, MD  Lancets MISC Use as directed to monitor FSBS 3x daily. Dx: E11.9. Please dispense TruePlus Ultra Thin. 08/20/15   Susy Frizzle, MD  LANTUS SOLOSTAR 100 UNIT/ML Solostar Pen INJECT 45 UNITS INTO THE SKIN 2 (TWO) TIMES DAILY. 07/15/15   Alycia Rossetti, MD   levothyroxine (SYNTHROID, LEVOTHROID) 100 MCG tablet Take 1 tablet (100 mcg total) by mouth daily. 10/29/13   Susy Frizzle, MD  losartan-hydrochlorothiazide (HYZAAR) 50-12.5 MG per tablet Take 1 tablet by mouth daily. 10/29/13   Susy Frizzle, MD  lubiprostone (AMITIZA) 24 MCG capsule Take 1 capsule (24 mcg total) by mouth 2 (two) times daily with a meal. 06/23/15   Alycia Rossetti,  MD  meloxicam (MOBIC) 15 MG tablet 1 TABLET DAILY AS NEEDED 08/20/15   Susy Frizzle, MD  metFORMIN (GLUCOPHAGE) 850 MG tablet Take 1 tablet (850 mg total) by mouth 2 (two) times daily with a meal. 08/20/15   Susy Frizzle, MD  Misc Natural Products (LAXATIVE FORMULA) TABS Take 1 tablet by mouth as needed (for BM).    Historical Provider, MD  ondansetron (ZOFRAN ODT) 4 MG disintegrating tablet Take 1 tablet (4 mg total) by mouth every 8 (eight) hours as needed for nausea or vomiting. 08/30/15   Kristen N Ward, DO  ondansetron (ZOFRAN) 4 MG tablet Take 1 tablet (4 mg total) by mouth every 6 (six) hours. 08/20/15   Susy Frizzle, MD  oxyCODONE-acetaminophen (PERCOCET/ROXICET) 5-325 MG tablet Take 1-2 tablets by mouth every 6 (six) hours as needed. 08/30/15   Kristen N Ward, DO  pregabalin (LYRICA) 150 MG capsule Take 1 capsule (150 mg total) by mouth 2 (two) times daily. 07/21/15   Alycia Rossetti, MD  tamsulosin (FLOMAX) 0.4 MG CAPS capsule Take 1 capsule (0.4 mg total) by mouth daily. 06/23/15   Alycia Rossetti, MD   BP 118/64 mmHg  Pulse 72  Temp(Src) 98.7 F (37.1 C) (Oral)  Resp 14  Ht '5\' 5"'$  (1.651 m)  Wt 92.987 kg  BMI 34.11 kg/m2  SpO2 95% Physical Exam  Constitutional: She appears well-developed and well-nourished. No distress.  HENT:  Head: Normocephalic and atraumatic.  Mouth/Throat: Oropharynx is clear and moist. No oropharyngeal exudate.  Eyes: Conjunctivae are normal. Pupils are equal, round, and reactive to light. Right eye exhibits no discharge. Left eye exhibits no discharge. No scleral  icterus.  Neck: No tracheal deviation present.  Cardiovascular: Normal rate, regular rhythm, normal heart sounds and intact distal pulses.  Exam reveals no gallop and no friction rub.   No murmur heard. Pulmonary/Chest: Effort normal and breath sounds normal. No respiratory distress. She has no wheezes. She has no rales. She exhibits no tenderness.  Abdominal: Soft. Bowel sounds are normal. She exhibits no distension and no mass. There is tenderness. There is no rebound and no guarding.  Mild, distractible epigastric tenderness.   Musculoskeletal: She exhibits no edema.  Lymphadenopathy:    She has no cervical adenopathy.  Neurological: She is alert. Coordination normal.  Skin: Skin is warm and dry. No rash noted. She is not diaphoretic. No erythema.  Psychiatric: She has a normal mood and affect. Her behavior is normal.  Nursing note and vitals reviewed.   ED Course  Procedures  Labs Review Labs Reviewed  BASIC METABOLIC PANEL - Abnormal; Notable for the following:    Glucose, Bld 208 (*)    All other components within normal limits  TROPONIN I  CBC WITH DIFFERENTIAL/PLATELET     EKG Interpretation   Date/Time:  Monday Aug 31 2015 15:59:51 EDT Ventricular Rate:  76 PR Interval:  160 QRS Duration: 88 QT Interval:  388 QTC Calculation: 436 R Axis:   -21 Text Interpretation:  Normal sinus rhythm Left ventricular hypertrophy  Abnormal ECG No significant change since last tracing Confirmed by NGUYEN,  EMILY ([id number]) on 08/31/2015 5:49:38 PM      MDM   Final diagnoses:  Chest pain, unspecified chest pain type   Patient was given percocet yesterday at discharge. CP most likely MSK in nature, given repeat troponin is negative. CBC, BMP unremarkable. Patient may be safely discharged home. Discussed reasons for return. Patient to follow-up with primary care provider  within one week. Patient in understanding and agreement with the plan.   Harlan Lions, PA-C 09/11/15  1834  Harvel Quale, MD 09/12/15 [phone]

## 2015-08-31 NOTE — ED Notes (Signed)
Pt placed on cardiac, bp and pulse ox monitoring.  Belongings in pt belongings bag.

## 2015-08-31 NOTE — Telephone Encounter (Signed)
Patient is calling to talk with you regarding going to er, they told her to follow up with dr pickard, but she says she cant because her chest hurts so bad, would like to know what she should do  [phone]

## 2015-08-31 NOTE — Discharge Instructions (Signed)
Ms. ALIYANNAH CORRIE,  Nice meeting you! Please follow-up with your primary care provider. Stop taking the percocet. Return to the emergency department if you develop chest pain, shortness of breath, nausea/vomiting, inability to walk, new/worsening symptoms. Feel better soon!  S. Wendie Simmer, PA-C

## 2015-08-31 NOTE — ED Notes (Addendum)
Chest, back and neck pain x 2 days. She was seen at [facility] for same and diagnosed with a pulled muscle in her chest. Pain is no better. Her MD was called this am and wanted to see her Thursday but she could not wait.

## 2015-09-01 NOTE — Telephone Encounter (Signed)
Spoke to pt on 08/31/15 and appt made for f/u on 09/03/15. Pt instructed to take her pain med and if it gets worse to go to the ER. Pt verbalized understanding

## 2015-09-02 ENCOUNTER — Other Ambulatory Visit: Payer: Self-pay | Admitting: Family Medicine

## 2015-09-02 NOTE — Telephone Encounter (Signed)
Refill appropriate and filled per protocol. 

## 2015-09-03 ENCOUNTER — Encounter: Payer: Self-pay | Admitting: Family Medicine

## 2015-09-03 ENCOUNTER — Ambulatory Visit (INDEPENDENT_AMBULATORY_CARE_PROVIDER_SITE_OTHER): Payer: Commercial Managed Care - HMO | Admitting: Family Medicine

## 2015-09-03 VITALS — BP 170/90 | HR 80 | Temp 97.8°F | Resp 16 | Ht 65.0 in | Wt 200.0 lb

## 2015-09-03 DIAGNOSIS — E1143 Type 2 diabetes mellitus with diabetic autonomic (poly)neuropathy: Secondary | ICD-10-CM | POA: Diagnosis not present

## 2015-09-03 DIAGNOSIS — R0789 Other chest pain: Secondary | ICD-10-CM

## 2015-09-03 DIAGNOSIS — Z09 Encounter for follow-up examination after completed treatment for conditions other than malignant neoplasm: Secondary | ICD-10-CM | POA: Diagnosis not present

## 2015-09-03 DIAGNOSIS — I10 Essential (primary) hypertension: Secondary | ICD-10-CM

## 2015-09-03 MED ORDER — DIAZEPAM 10 MG PO TABS: 10.0000 mg | ORAL_TABLET | Freq: Three times a day (TID) | ORAL | Status: DC | PRN

## 2015-09-03 MED ORDER — PANTOPRAZOLE SODIUM 40 MG PO TBEC: 40.0000 mg | DELAYED_RELEASE_TABLET | Freq: Every day | ORAL | Status: DC

## 2015-09-03 MED ORDER — DICLOFENAC SODIUM 75 MG PO TBEC: 75.0000 mg | DELAYED_RELEASE_TABLET | Freq: Two times a day (BID) | ORAL | Status: DC

## 2015-09-03 MED ORDER — AMLODIPINE BESYLATE 10 MG PO TABS: 10.0000 mg | ORAL_TABLET | Freq: Every day | ORAL | Status: DC

## 2015-09-03 NOTE — Progress Notes (Signed)
Subjective:    Patient ID: [name], female    DOB: 08/29/1950, 65 y.o.   MRN: [id number]  HPI Patient has a past medical history of nonischemic cardiomyopathy with an ejection fraction of 40-45% in 2007. Follow-up echocardiograms have revealed improvement in her most recent echocardiogram last year revealed an ejection fraction of 50-55%. Patient had a cardiac catheterization in 2007 that showed normal coronary arteries. Last week, she reports abdominal bloating, heartburn, reflux type symptoms. She was taking frequent Alka-Seltzer's. Suddenly over the weekend, she developed severe 10 out of 10 chest pain over the left sternal border. She went to emergency room her d-dimer was slightly elevated. CT scan of the chest revealed no pulmonary embolism. Troponins were negative 3 and EKGs were normal. She was discharged home with a diagnosis of chest wall pain. The pain returned the following day and she went to an urgent care where they diagnosed her with chest wall pain and she was started on Valium and an anti-inflammatory. She continues to have pain that is reproducible at the left sternal border consistent with costochondritis. She also reports mild pleurisy. She reports some shortness of breath with activity. The pain radiates into her back. She is also continuing to have heartburn. Her blood pressure significantly elevated today.  I reviewed her EKG from 5/23 which revealed no ischemic changes. Furthermore there is no ST segment changes consistent with pericarditis. Past Medical History  Diagnosis Date  . GERD (gastroesophageal reflux disease)   . Hypothyroidism   . Peripheral neuropathy ([place])   . Cardiomyopathy   . Chest pain 06/2005    Hospitalized, dystolic dysfunction  . Sciatica   . Type II diabetes mellitus ([place])   . PONV (postoperative nausea and vomiting)   . H/O hiatal hernia   . Migraines     "it's been a long time since I've had one" (03/20/2012)  . Osteoarthritis   .  Arthritis     "all over; mostly in my back" (03/20/2012)  . Chronic lower back pain     "have 2 herniated discs; going to have to have a fusion" (03/20/2012)  . Breast cancer ([place])     "right" (03/20/2012)  . Family history of anesthesia complication     "father has nausea and vomiting"  . Irritable bowel syndrome   . Hypertension     "patient states never had HTN, takes Hyzaar for heart  . History of right mastectomy   . Anginal pain ([place]) 03/19/2012    saw Dr. Cathie Olden .. she thinks its more related to stomach issues  . History of kidney stones    Past Surgical History  Procedure Laterality Date  . Abdominal hysterectomy  1993  . Total knee arthroplasty  2000    Right  . Mastectomy with axillary lymph node dissection  12/2003    "right" (03/20/2012)  . Thyroidectomy  1977    Right  . Posterior cervical fusion/foraminotomy  2001  . Shoulder arthroscopy w/ rotator cuff repair  2003    Left  . Cardiac catheterization  ? 2005 / 2007  . Adenosin cardiolite  07/2005    (+) wall motion abnormality  . Esophagogastroduodenoscopy  2005    GERD  . [place] of abdomen  07/2005    Fatty liver / kidney stones  . Ct abdomen and pelvis  02/2010    Same  . Knee arthroscopy  1990's    "right" (03/20/2012)  . Axillary lymph node dissection  2003    squamous  cell cancer  . Back surgery    . Lumbar laminectomy/decompression microdiscectomy N/A 02/04/2014    Procedure: RIGHT L2-3 MICRODISCECTOMY ;  Surgeon: Jessy Oto, MD;  Location: [facility];  Service: Orthopedics;  Laterality: N/A;  . Lumbar fusion N/A 08/22/2014    Procedure: Right L2-3 and right L1-2 transforaminal lumbar interbody fusion with pedicle screws, rods, sleeves, cages, local bone graft, Vivigen, cancellous chips;  Surgeon: Jessy Oto, MD;  Location: [facility];  Service: Orthopedics;  Laterality: N/A;   Current Outpatient Prescriptions on File Prior to Visit  Medication Sig Dispense Refill  . alendronate (FOSAMAX) 70 MG tablet Take 1  tablet (70 mg total) by mouth every Monday. Reported on 05/27/2015 12 tablet 3  . aspirin 81 MG tablet Take 81 mg by mouth daily.    [name] Kitchen atorvastatin (LIPITOR) 10 MG tablet Take 1 tablet (10 mg total) by mouth daily. 90 tablet 3  . Blood Glucose Calibration (TRUE METRIX LEVEL 2) Normal SOLN Use as directed. 3 each 3  . Blood Glucose Monitoring Suppl (BLOOD GLUCOSE SYSTEM PAK) KIT Use as directed to monitor FSBS 3x daily. Dx: E11.9. Please dispense [employer] True Metrix. 1 each 1  . Cholecalciferol (VITAMIN D3) 5000 UNITS CAPS Take 5,000 Units by mouth 2 (two) times daily.    [name] Kitchen docusate sodium (COLACE) 100 MG capsule Take 1 capsule (100 mg total) by mouth every 12 (twelve) hours. 60 capsule 0  . fluticasone (FLONASE) 50 MCG/ACT nasal spray Place 2 sprays into both nostrils daily. 48 g 3  . Glucose Blood (BLOOD GLUCOSE TEST STRIPS) STRP Use as directed to monitor FSBS 3x daily. Dx: E11.9. Please dispense [employer] True Metrix. 300 each 3  . GLYXAMBI 25-5 MG TABS Take 1 tablet by mouth daily.    [name] Kitchen HYDROcodone-acetaminophen (NORCO) 7.5-325 MG per tablet TAKE 1 TO 2 TABLETS BY MOUTH EVERY 4 TO 6 HOURS AS NEEDED FOR PAIN MAXIMUM 8 TABLETS PER DAY  0  . insulin aspart (NOVOLOG) 100 UNIT/ML FlexPen Inject 10 Units into the skin 3 (three) times daily with meals. 5 pen 11  . Insulin Pen Needle (PEN NEEDLES 5/16") 30G X 8 MM MISC Use to inject insulin 5x daily. 200 each 11  . Lancet Devices MISC Use as directed to monitor FSBS 3x daily. Dx: E11.9. Please dispense TruePlus Ultra Thin. 1 each 1  . Lancets MISC Use as directed to monitor FSBS 3x daily. Dx: E11.9. Please dispense TruePlus Ultra Thin. 300 each 3  . LANTUS SOLOSTAR 100 UNIT/ML Solostar Pen INJECT 45 UNITS INTO THE SKIN 2 (TWO) TIMES DAILY. 30 pen 1  . levothyroxine (SYNTHROID, LEVOTHROID) 100 MCG tablet TAKE 1 TABLET EVERY DAY 30 tablet 1  . losartan-hydrochlorothiazide (HYZAAR) 50-12.5 MG tablet TAKE 1 TABLET EVERY DAY 30 tablet 1  . lubiprostone (AMITIZA)  24 MCG capsule Take 1 capsule (24 mcg total) by mouth 2 (two) times daily with a meal. 6 capsule 0  . meloxicam (MOBIC) 15 MG tablet 1 TABLET DAILY AS NEEDED 90 tablet 4  . metFORMIN (GLUCOPHAGE) 850 MG tablet Take 1 tablet (850 mg total) by mouth 2 (two) times daily with a meal. 180 tablet 3  . Misc Natural Products (LAXATIVE FORMULA) TABS Take 1 tablet by mouth as needed (for BM).    . naproxen (NAPROSYN) 500 MG tablet Take 1 tablet (500 mg total) by mouth 2 (two) times daily. 30 tablet 0  . ondansetron (ZOFRAN ODT) 4 MG disintegrating tablet Take 1 tablet (4 mg total)  by mouth every 8 (eight) hours as needed for nausea or vomiting. 20 tablet 0  . ondansetron (ZOFRAN) 4 MG tablet Take 1 tablet (4 mg total) by mouth every 6 (six) hours. 36 tablet 3  . pregabalin (LYRICA) 150 MG capsule Take 1 capsule (150 mg total) by mouth 2 (two) times daily. 60 capsule 2  . tamsulosin (FLOMAX) 0.4 MG CAPS capsule Take 1 capsule (0.4 mg total) by mouth daily. 30 capsule 0   No current facility-administered medications on file prior to visit.   Allergies  Allergen Reactions  . Dilaudid [Hydromorphone Hcl] Nausea And Vomiting  . Adhesive [Tape] Rash  . Ampicillin Rash    REACTION: Rash and welts on her hands when she took it in the hospital  . Penicillins Rash   Social History   Social History  . Marital Status: Married    Spouse Name: N/A  . Number of Children: 1  . Years of Education: N/A   Occupational History  . Retired since 2003    Social History Main Topics  . Smoking status: Never Smoker   . Smokeless tobacco: Never Used  . Alcohol Use: No  . Drug Use: No  . Sexual Activity: Not on file   Other Topics Concern  . Not on file   Social History Narrative      Review of Systems  All other systems reviewed and are negative.      Objective:   Physical Exam  Constitutional: She appears well-developed and well-nourished. No distress.  Neck: No JVD present.  Cardiovascular:  Normal rate, regular rhythm and normal heart sounds.   Pulmonary/Chest: Effort normal. She has no wheezes. She has no rales. She exhibits tenderness.  Abdominal: Soft. Bowel sounds are normal. She exhibits no distension. There is no tenderness. There is no rebound and no guarding.  Musculoskeletal: She exhibits no edema.  Skin: She is not diaphoretic.  Vitals reviewed.         Assessment & Plan:  Hospital discharge follow-up - Plan: Ambulatory referral to Cardiology  Other chest pain - Plan: Ambulatory referral to Cardiology  Type 2 diabetes mellitus with diabetic autonomic neuropathy, without long-term current use of insulin ([place])  Essential hypertension  Most likely this represents chest wall pain and the reproducible nature at the costochondral junction is consistent with costochondritis. I recommended that she use diclofenac 75 mg by mouth twice a day with Valium 5-10 mg every 8 hours as needed for muscle pain. GI sources are also a possibility. Therefore I will start the patient on protonix 40 mg by mouth daily and consider an EGD if pain persists. The pain does not sound like esophageal spasm. Her blood pressure is extremely high today and I will add amlodipine 10 mg by mouth daily and recheck the patient the first of next week to make sure her blood pressure is better. I will also consult cardiology given her age and numerous risk factors. She may need a stress test particularly given the shortness of breath and dyspnea on exertion with her chest pain which seems a bit extreme for costochondritis.

## 2015-09-08 ENCOUNTER — Ambulatory Visit (INDEPENDENT_AMBULATORY_CARE_PROVIDER_SITE_OTHER): Payer: Commercial Managed Care - HMO | Admitting: Family Medicine

## 2015-09-08 ENCOUNTER — Encounter: Payer: Self-pay | Admitting: Family Medicine

## 2015-09-08 VITALS — BP 122/70 | HR 96 | Temp 98.4°F | Resp 18 | Wt 200.0 lb

## 2015-09-08 DIAGNOSIS — M2550 Pain in unspecified joint: Secondary | ICD-10-CM

## 2015-09-08 MED ORDER — DOXYCYCLINE HYCLATE 100 MG PO TABS: 100.0000 mg | ORAL_TABLET | Freq: Two times a day (BID) | ORAL | Status: DC

## 2015-09-08 NOTE — Progress Notes (Signed)
Subjective:    Patient ID: [name], female    DOB: 11-Apr-1951, 65 y.o.   MRN: [id number]  HPI 09/03/15 Patient has a past medical history of nonischemic cardiomyopathy with an ejection fraction of 40-45% in 2007. Follow-up echocardiograms have revealed improvement in her most recent echocardiogram last year revealed an ejection fraction of 50-55%. Patient had a cardiac catheterization in 2007 that showed normal coronary arteries. Last week, she reports abdominal bloating, heartburn, reflux type symptoms. She was taking frequent Alka-Seltzer's. Suddenly over the weekend, she developed severe 10 out of 10 chest pain over the left sternal border. She went to emergency room her d-dimer was slightly elevated. CT scan of the chest revealed no pulmonary embolism. Troponins were negative 3 and EKGs were normal. She was discharged home with a diagnosis of chest wall pain. The pain returned the following day and she went to an urgent care where they diagnosed her with chest wall pain and she was started on Valium and an anti-inflammatory. She continues to have pain that is reproducible at the left sternal border consistent with costochondritis. She also reports mild pleurisy. She reports some shortness of breath with activity. The pain radiates into her back. She is also continuing to have heartburn. Her blood pressure significantly elevated today.  I reviewed her EKG from 5/23 which revealed no ischemic changes. Furthermore there is no ST segment changes consistent with pericarditis.  At that time, my plan was: Most likely this represents chest wall pain and the reproducible nature at the costochondral junction is consistent with costochondritis. I recommended that she use diclofenac 75 mg by mouth twice a day with Valium 5-10 mg every 8 hours as needed for muscle pain. GI sources are also a possibility. Therefore I will start the patient on protonix 40 mg by mouth daily and consider an EGD if pain  persists. The pain does not sound like esophageal spasm. Her blood pressure is extremely high today and I will add amlodipine 10 mg by mouth daily and recheck the patient the first of next week to make sure her blood pressure is better. I will also consult cardiology given her age and numerous risk factors. She may need a stress test particularly given the shortness of breath and dyspnea on exertion with her chest pain which seems a bit extreme for costochondritis.  09/08/15 Thankfully the patient's chest pain is better. Her blood pressure today is much better. However she looks much worse. She is now reporting diffuse arthralgias in her lower back, in both of her hips and thighs.  She reports weakness. She is having a difficult time even getting up out of a chair. Her legs are not strong enough to push herself to a stand. She also reports diffuse arthralgias in all of the joints of her hands including the DIP joints, the PIP joints, and MCP joints. There is no active synovitis. There is no erythema or warmth or swelling in the joints however she has subjective pain there. Her pain is extremely widespread and she is extremely fatigued and tired. Symptoms really started in earnest approximately 1 week ago [place] in [place] at that point. They denied any tick bite. Of note she also recently started Lipitor for hyperlipidemia. Her weight is unchanged and she is afebrile. Past Medical History  Diagnosis Date  . GERD (gastroesophageal reflux disease)   . Hypothyroidism   . Peripheral neuropathy ([place])   . Cardiomyopathy   .  Chest pain 06/2005    Hospitalized, dystolic dysfunction  . Sciatica   . Type II diabetes mellitus ([place])   . PONV (postoperative nausea and vomiting)   . H/O hiatal hernia   . Migraines     "it's been a long time since I've had one" (03/20/2012)  . Osteoarthritis   . Arthritis     "all over; mostly in my back" (03/20/2012)  . Chronic lower back  pain     "have 2 herniated discs; going to have to have a fusion" (03/20/2012)  . Breast cancer ([place])     "right" (03/20/2012)  . Family history of anesthesia complication     "father has nausea and vomiting"  . Irritable bowel syndrome   . Hypertension     "patient states never had HTN, takes Hyzaar for heart  . History of right mastectomy   . Anginal pain ([place]) 03/19/2012    saw Dr. Cathie Olden .. she thinks its more related to stomach issues  . History of kidney stones    Past Surgical History  Procedure Laterality Date  . Abdominal hysterectomy  1993  . Total knee arthroplasty  2000    Right  . Mastectomy with axillary lymph node dissection  12/2003    "right" (03/20/2012)  . Thyroidectomy  1977    Right  . Posterior cervical fusion/foraminotomy  2001  . Shoulder arthroscopy w/ rotator cuff repair  2003    Left  . Cardiac catheterization  ? 2005 / 2007  . Adenosin cardiolite  07/2005    (+) wall motion abnormality  . Esophagogastroduodenoscopy  2005    GERD  . [place] of abdomen  07/2005    Fatty liver / kidney stones  . Ct abdomen and pelvis  02/2010    Same  . Knee arthroscopy  1990's    "right" (03/20/2012)  . Axillary lymph node dissection  2003    squamous cell cancer  . Back surgery    . Lumbar laminectomy/decompression microdiscectomy N/A 02/04/2014    Procedure: RIGHT L2-3 MICRODISCECTOMY ;  Surgeon: Jessy Oto, MD;  Location: [facility];  Service: Orthopedics;  Laterality: N/A;  . Lumbar fusion N/A 08/22/2014    Procedure: Right L2-3 and right L1-2 transforaminal lumbar interbody fusion with pedicle screws, rods, sleeves, cages, local bone graft, Vivigen, cancellous chips;  Surgeon: Jessy Oto, MD;  Location: [facility];  Service: Orthopedics;  Laterality: N/A;   Current Outpatient Prescriptions on File Prior to Visit  Medication Sig Dispense Refill  . alendronate (FOSAMAX) 70 MG tablet Take 1 tablet (70 mg total) by mouth every Monday. Reported on 05/27/2015 12 tablet 3    . amLODipine (NORVASC) 10 MG tablet Take 1 tablet (10 mg total) by mouth daily. 90 tablet 3  . aspirin 81 MG tablet Take 81 mg by mouth daily.    [name] Kitchen atorvastatin (LIPITOR) 10 MG tablet Take 1 tablet (10 mg total) by mouth daily. 90 tablet 3  . Blood Glucose Calibration (TRUE METRIX LEVEL 2) Normal SOLN Use as directed. 3 each 3  . Blood Glucose Monitoring Suppl (BLOOD GLUCOSE SYSTEM PAK) KIT Use as directed to monitor FSBS 3x daily. Dx: E11.9. Please dispense [employer] True Metrix. 1 each 1  . Cholecalciferol (VITAMIN D3) 5000 UNITS CAPS Take 5,000 Units by mouth 2 (two) times daily.    . diazepam (VALIUM) 10 MG tablet Take 1 tablet (10 mg total) by mouth every 8 (eight) hours as needed (for muscle pain). 30 tablet 1  .  diclofenac (VOLTAREN) 75 MG EC tablet Take 1 tablet (75 mg total) by mouth 2 (two) times daily. 30 tablet 0  . docusate sodium (COLACE) 100 MG capsule Take 1 capsule (100 mg total) by mouth every 12 (twelve) hours. 60 capsule 0  . fluticasone (FLONASE) 50 MCG/ACT nasal spray Place 2 sprays into both nostrils daily. 48 g 3  . Glucose Blood (BLOOD GLUCOSE TEST STRIPS) STRP Use as directed to monitor FSBS 3x daily. Dx: E11.9. Please dispense [employer] True Metrix. 300 each 3  . GLYXAMBI 25-5 MG TABS Take 1 tablet by mouth daily.    [name] Kitchen HYDROcodone-acetaminophen (NORCO) 7.5-325 MG per tablet TAKE 1 TO 2 TABLETS BY MOUTH EVERY 4 TO 6 HOURS AS NEEDED FOR PAIN MAXIMUM 8 TABLETS PER DAY  0  . insulin aspart (NOVOLOG) 100 UNIT/ML FlexPen Inject 10 Units into the skin 3 (three) times daily with meals. 5 pen 11  . Insulin Pen Needle (PEN NEEDLES 5/16") 30G X 8 MM MISC Use to inject insulin 5x daily. 200 each 11  . Lancet Devices MISC Use as directed to monitor FSBS 3x daily. Dx: E11.9. Please dispense TruePlus Ultra Thin. 1 each 1  . Lancets MISC Use as directed to monitor FSBS 3x daily. Dx: E11.9. Please dispense TruePlus Ultra Thin. 300 each 3  . LANTUS SOLOSTAR 100 UNIT/ML Solostar Pen INJECT 45  UNITS INTO THE SKIN 2 (TWO) TIMES DAILY. 30 pen 1  . levothyroxine (SYNTHROID, LEVOTHROID) 100 MCG tablet TAKE 1 TABLET EVERY DAY 30 tablet 1  . losartan-hydrochlorothiazide (HYZAAR) 50-12.5 MG tablet TAKE 1 TABLET EVERY DAY 30 tablet 1  . lubiprostone (AMITIZA) 24 MCG capsule Take 1 capsule (24 mcg total) by mouth 2 (two) times daily with a meal. 6 capsule 0  . meloxicam (MOBIC) 15 MG tablet 1 TABLET DAILY AS NEEDED 90 tablet 4  . metFORMIN (GLUCOPHAGE) 850 MG tablet Take 1 tablet (850 mg total) by mouth 2 (two) times daily with a meal. 180 tablet 3  . Misc Natural Products (LAXATIVE FORMULA) TABS Take 1 tablet by mouth as needed (for BM).    . naproxen (NAPROSYN) 500 MG tablet Take 1 tablet (500 mg total) by mouth 2 (two) times daily. 30 tablet 0  . ondansetron (ZOFRAN ODT) 4 MG disintegrating tablet Take 1 tablet (4 mg total) by mouth every 8 (eight) hours as needed for nausea or vomiting. 20 tablet 0  . ondansetron (ZOFRAN) 4 MG tablet Take 1 tablet (4 mg total) by mouth every 6 (six) hours. 36 tablet 3  . oxyCODONE-acetaminophen (PERCOCET/ROXICET) 5-325 MG tablet 1 tablet every 6 (six) hours as needed for moderate pain.     . pantoprazole (PROTONIX) 40 MG tablet Take 1 tablet (40 mg total) by mouth daily. 30 tablet 3  . pregabalin (LYRICA) 150 MG capsule Take 1 capsule (150 mg total) by mouth 2 (two) times daily. 60 capsule 2  . tamsulosin (FLOMAX) 0.4 MG CAPS capsule Take 1 capsule (0.4 mg total) by mouth daily. 30 capsule 0   No current facility-administered medications on file prior to visit.   Allergies  Allergen Reactions  . Dilaudid [Hydromorphone Hcl] Nausea And Vomiting  . Adhesive [Tape] Rash  . Ampicillin Rash    REACTION: Rash and welts on her hands when she took it in the hospital  . Penicillins Rash   Social History   Social History  . Marital Status: Married    Spouse Name: N/A  . Number of Children: 1  . Years  of Education: N/A   Occupational History  . Retired  since 2003    Social History Main Topics  . Smoking status: Never Smoker   . Smokeless tobacco: Never Used  . Alcohol Use: No  . Drug Use: No  . Sexual Activity: Not on file   Other Topics Concern  . Not on file   Social History Narrative      Review of Systems  All other systems reviewed and are negative.      Objective:   Physical Exam  Constitutional: She appears well-developed and well-nourished. No distress.  Neck: No JVD present.  Cardiovascular: Normal rate, regular rhythm and normal heart sounds.   Pulmonary/Chest: Effort normal. She has no wheezes. She has no rales. She exhibits tenderness.  Abdominal: Soft. Bowel sounds are normal. She exhibits no distension. There is no tenderness. There is no rebound and no guarding.  Musculoskeletal: She exhibits no edema.  Skin: She is not diaphoretic.  Vitals reviewed.         Assessment & Plan:  Polyarthralgia - Plan: B. burgdorfi antibodies by WB, [place] mtn spotted fvr abs pnl(IgG+IgM), Sedimentation rate, ANA, CK, doxycycline (VIBRA-TABS) 100 MG tablet, Rheumatoid factor  This appears to be some type of systemic illness. I no longer believe that the patient only is suffering from costochondritis. In any respect, her chest wall pain has gotten better however she is now reporting diffuse myalgias and arthralgias all over her body. Given the time year, I'm concerned about tickborne illnesses like Lyme disease. I will cover her with doxycycline 100 mg by mouth twice a day for 10 days while awaiting tickborne titers.  Check a sedimentation rate, and ANA, and rheumatoid factor to evaluate for autoimmune diseases. I'm also concerned about statin-induced myopathy. I will have the patient discontinue Lipitor and I will check a CK level.

## 2015-09-09 ENCOUNTER — Other Ambulatory Visit: Payer: Commercial Managed Care - HMO

## 2015-09-09 ENCOUNTER — Encounter: Payer: Self-pay | Admitting: Family Medicine

## 2015-09-14 ENCOUNTER — Other Ambulatory Visit: Payer: Commercial Managed Care - HMO

## 2015-09-14 DIAGNOSIS — M2550 Pain in unspecified joint: Secondary | ICD-10-CM | POA: Diagnosis not present

## 2015-09-15 LAB — SEDIMENTATION RATE: SED RATE: 25 mm/h (ref 0–30)

## 2015-09-15 LAB — CK: CK TOTAL: 60 U/L (ref 7–177)

## 2015-09-15 LAB — RHEUMATOID FACTOR

## 2015-09-16 LAB — ANTI-NUCLEAR AB-TITER (ANA TITER): ANA Titer 1: 1:40 {titer} — ABNORMAL HIGH

## 2015-09-16 LAB — ANA: ANA: POSITIVE — AB

## 2015-09-17 LAB — LYME ABY, WSTRN BLT IGG & IGM W/BANDS
B BURGDORFERI IGG ABS (IB): NEGATIVE
B burgdorferi IgM Abs (IB): NEGATIVE
LYME DISEASE 18 KD IGG: NONREACTIVE
LYME DISEASE 23 KD IGG: NONREACTIVE
LYME DISEASE 28 KD IGG: NONREACTIVE
LYME DISEASE 30 KD IGG: NONREACTIVE
LYME DISEASE 39 KD IGG: NONREACTIVE
LYME DISEASE 41 KD IGM: NONREACTIVE
Lyme Disease 23 kD IgM: NONREACTIVE
Lyme Disease 39 kD IgM: NONREACTIVE
Lyme Disease 41 kD IgG: NONREACTIVE
Lyme Disease 45 kD IgG: NONREACTIVE
Lyme Disease 58 kD IgG: NONREACTIVE
Lyme Disease 66 kD IgG: NONREACTIVE
Lyme Disease 93 kD IgG: NONREACTIVE

## 2015-09-17 LAB — ROCKY MTN SPOTTED FVR ABS PNL(IGG+IGM)
RMSF IGG: NOT DETECTED
RMSF IgM: NOT DETECTED

## 2015-09-18 ENCOUNTER — Ambulatory Visit (INDEPENDENT_AMBULATORY_CARE_PROVIDER_SITE_OTHER): Payer: Commercial Managed Care - HMO | Admitting: Family Medicine

## 2015-09-18 ENCOUNTER — Telehealth: Payer: Self-pay | Admitting: Family Medicine

## 2015-09-18 ENCOUNTER — Encounter: Payer: Self-pay | Admitting: Family Medicine

## 2015-09-18 VITALS — BP 160/84 | HR 68 | Temp 97.8°F | Resp 18 | Ht 65.0 in | Wt 202.0 lb

## 2015-09-18 DIAGNOSIS — R42 Dizziness and giddiness: Secondary | ICD-10-CM | POA: Diagnosis not present

## 2015-09-18 DIAGNOSIS — M2550 Pain in unspecified joint: Secondary | ICD-10-CM | POA: Diagnosis not present

## 2015-09-18 DIAGNOSIS — R531 Weakness: Secondary | ICD-10-CM

## 2015-09-18 NOTE — Telephone Encounter (Signed)
Received fax from [facility] Auth# [id number] Treating Provider Dr. Tyson Dense # of Visits 6 Start Date 08/13/15 End Date 02/09/16 CPT 99499 DX E11.43 and L60.0

## 2015-09-18 NOTE — Progress Notes (Signed)
 Subjective:    Patient ID: [name], female    DOB: 03/24/1951, 64 y.o.   MRN: [id number]  HPI 09/03/15 Patient has a past medical history of nonischemic cardiomyopathy with an ejection fraction of 40-45% in 2007. Follow-up echocardiograms have revealed improvement in her most recent echocardiogram last year revealed an ejection fraction of 50-55%. Patient had a cardiac catheterization in 2007 that showed normal coronary arteries. Last week, she reports abdominal bloating, heartburn, reflux type symptoms. She was taking frequent Alka-Seltzer's. Suddenly over the weekend, she developed severe 10 out of 10 chest pain over the left sternal border. She went to emergency room her d-dimer was slightly elevated. CT scan of the chest revealed no pulmonary embolism. Troponins were negative 3 and EKGs were normal. She was discharged home with a diagnosis of chest wall pain. The pain returned the following day and she went to an urgent care where they diagnosed her with chest wall pain and she was started on Valium and an anti-inflammatory. She continues to have pain that is reproducible at the left sternal border consistent with costochondritis. She also reports mild pleurisy. She reports some shortness of breath with activity. The pain radiates into her back. She is also continuing to have heartburn. Her blood pressure significantly elevated today.  I reviewed her EKG from 5/23 which revealed no ischemic changes. Furthermore there is no ST segment changes consistent with pericarditis.  At that time, my plan was: Most likely this represents chest wall pain and the reproducible nature at the costochondral junction is consistent with costochondritis. I recommended that she use diclofenac 75 mg by mouth twice a day with Valium 5-10 mg every 8 hours as needed for muscle pain. GI sources are also a possibility. Therefore I will start the patient on protonix 40 mg by mouth daily and consider an EGD if pain  persists. The pain does not sound like esophageal spasm. Her blood pressure is extremely high today and I will add amlodipine 10 mg by mouth daily and recheck the patient the first of next week to make sure her blood pressure is better. I will also consult cardiology given her age and numerous risk factors. She may need a stress test particularly given the shortness of breath and dyspnea on exertion with her chest pain which seems a bit extreme for costochondritis.  09/08/15 Thankfully the patient's chest pain is better. Her blood pressure today is much better. However she looks much worse. She is now reporting diffuse arthralgias in her lower back, in both of her hips and thighs.  She reports weakness. She is having a difficult time even getting up out of a chair. Her legs are not strong enough to push herself to a stand. She also reports diffuse arthralgias in all of the joints of her hands including the DIP joints, the PIP joints, and MCP joints. There is no active synovitis. There is no erythema or warmth or swelling in the joints however she has subjective pain there. Her pain is extremely widespread and she is extremely fatigued and tired. Symptoms really started in earnest approximately 1 week ago [place] in [place] at that point. They denied any tick bite. Of note she also recently started Lipitor for hyperlipidemia. Her weight is unchanged and she is afebrile.  At that time, my plan was: This appears to be some type of systemic illness. I no longer believe that the patient only is suffering from costochondritis. In   any respect, her chest wall pain has gotten better however she is now reporting diffuse myalgias and arthralgias all over her body. Given the time year, I'm concerned about tickborne illnesses like Lyme disease. I will cover her with doxycycline 100 mg by mouth twice a day for 10 days while awaiting tickborne titers.  Check a sedimentation rate, and ANA, and  rheumatoid factor to evaluate for autoimmune diseases. I'm also concerned about statin-induced myopathy. I will have the patient discontinue Lipitor and I will check a CK level.  09/18/15 No visits with results within 1 Week(s) from this visit. Latest known visit with results is:  Abstract on 09/09/2015  Component Date Value Ref Range Status  . HM Mammogram 08/13/2015 Self Reported Normal  0-4 Bi-Rad, Self Reported Normal Final   Labs returned significant only for a faintly positive ANA in a speckled pattern that disappeared after a 1-40 dilution. Patient states that she is doing much better than she was last time after stopping Lipitor and starting the doxycycline. Lab work shows no evidence of [place] spotted fever or Lyme disease. The muscle weakness in the muscle pains particularly in her hips and thighs and legs have improved dramatically and have gone from a 8 on a scale of 10 down to a 2 or 3 on a scale of 10. Her strength is much better. Last time she was having to walk into the clinic using a walker due to weakness. She cannot even stand up out of a chair due to weakness. Now she is able to stand up without assistance. The strength in her legs is much better. Her biggest concern at this time is persistent dizziness. She still feels unsteady on her feet. She is not sure if that is due to the weakness in her legs or just plain dizziness. She has normal sensation in her legs and there is no evidence of paralysis today on her exam. She denies any vertigo. Muscle strength is 5 over 5 equal and symmetric in both legs. She has no muscle weakness in her arms. There is no obvious neurologic deficit although she does feel unsteady when she is walking. While the patient is here today I performed a diabetic foot exam. She has diminished sensation to 10 g monofilament in 2 out of the 4 locations checked primarily on the ball of the foot on the plantar surface of the first MTP joint as well as on the tips  of the great toe. She has pre-ulcerative calluses forming on the medial aspect of the first MTP joints bilaterally. She does have neuropathy.  Past Medical History  Diagnosis Date  . GERD (gastroesophageal reflux disease)   . Hypothyroidism   . Peripheral neuropathy (HCC)   . Cardiomyopathy   . Chest pain 06/2005    Hospitalized, dystolic dysfunction  . Sciatica   . Type II diabetes mellitus (HCC)   . PONV (postoperative nausea and vomiting)   . H/O hiatal hernia   . Migraines     "it's been a long time since I've had one" (03/20/2012)  . Osteoarthritis   . Arthritis     "all over; mostly in my back" (03/20/2012)  . Chronic lower back pain     "have 2 herniated discs; going to have to have a fusion" (03/20/2012)  . Breast cancer (HCC)     "right" (03/20/2012)  . Family history of anesthesia complication     "father has nausea and vomiting"  . Irritable bowel syndrome   .   Hypertension     "patient states never had HTN, takes Hyzaar for heart  . History of right mastectomy   . Anginal pain ([place]) 03/19/2012    saw Dr. Cathie Olden .. she thinks its more related to stomach issues  . History of kidney stones    Past Surgical History  Procedure Laterality Date  . Abdominal hysterectomy  1993  . Total knee arthroplasty  2000    Right  . Mastectomy with axillary lymph node dissection  12/2003    "right" (03/20/2012)  . Thyroidectomy  1977    Right  . Posterior cervical fusion/foraminotomy  2001  . Shoulder arthroscopy w/ rotator cuff repair  2003    Left  . Cardiac catheterization  ? 2005 / 2007  . Adenosin cardiolite  07/2005    (+) wall motion abnormality  . Esophagogastroduodenoscopy  2005    GERD  . [place] of abdomen  07/2005    Fatty liver / kidney stones  . Ct abdomen and pelvis  02/2010    Same  . Knee arthroscopy  1990's    "right" (03/20/2012)  . Axillary lymph node dissection  2003    squamous cell cancer  . Back surgery    . Lumbar laminectomy/decompression  microdiscectomy N/A 02/04/2014    Procedure: RIGHT L2-3 MICRODISCECTOMY ;  Surgeon: Jessy Oto, MD;  Location: [facility];  Service: Orthopedics;  Laterality: N/A;  . Lumbar fusion N/A 08/22/2014    Procedure: Right L2-3 and right L1-2 transforaminal lumbar interbody fusion with pedicle screws, rods, sleeves, cages, local bone graft, Vivigen, cancellous chips;  Surgeon: Jessy Oto, MD;  Location: [facility];  Service: Orthopedics;  Laterality: N/A;   Current Outpatient Prescriptions on File Prior to Visit  Medication Sig Dispense Refill  . alendronate (FOSAMAX) 70 MG tablet Take 1 tablet (70 mg total) by mouth every Monday. Reported on 05/27/2015 12 tablet 3  . amLODipine (NORVASC) 10 MG tablet Take 1 tablet (10 mg total) by mouth daily. 90 tablet 3  . aspirin 81 MG tablet Take 81 mg by mouth daily.    [name] Kitchen atorvastatin (LIPITOR) 10 MG tablet Take 1 tablet (10 mg total) by mouth daily. 90 tablet 3  . Blood Glucose Calibration (TRUE METRIX LEVEL 2) Normal SOLN Use as directed. 3 each 3  . Blood Glucose Monitoring Suppl (BLOOD GLUCOSE SYSTEM PAK) KIT Use as directed to monitor FSBS 3x daily. Dx: E11.9. Please dispense [employer] True Metrix. 1 each 1  . Cholecalciferol (VITAMIN D3) 5000 UNITS CAPS Take 5,000 Units by mouth 2 (two) times daily.    . diazepam (VALIUM) 10 MG tablet Take 1 tablet (10 mg total) by mouth every 8 (eight) hours as needed (for muscle pain). 30 tablet 1  . diclofenac (VOLTAREN) 75 MG EC tablet Take 1 tablet (75 mg total) by mouth 2 (two) times daily. 30 tablet 0  . docusate sodium (COLACE) 100 MG capsule Take 1 capsule (100 mg total) by mouth every 12 (twelve) hours. 60 capsule 0  . doxycycline (VIBRA-TABS) 100 MG tablet Take 1 tablet (100 mg total) by mouth 2 (two) times daily. 20 tablet 0  . fluticasone (FLONASE) 50 MCG/ACT nasal spray Place 2 sprays into both nostrils daily. 48 g 3  . Glucose Blood (BLOOD GLUCOSE TEST STRIPS) STRP Use as directed to monitor FSBS 3x daily. Dx: E11.9.  Please dispense [employer] True Metrix. 300 each 3  . GLYXAMBI 25-5 MG TABS Take 1 tablet by mouth daily.    [name] Kitchen  HYDROcodone-acetaminophen (NORCO) 7.5-325 MG per tablet TAKE 1 TO 2 TABLETS BY MOUTH EVERY 4 TO 6 HOURS AS NEEDED FOR PAIN MAXIMUM 8 TABLETS PER DAY  0  . insulin aspart (NOVOLOG) 100 UNIT/ML FlexPen Inject 10 Units into the skin 3 (three) times daily with meals. 5 pen 11  . Insulin Pen Needle (PEN NEEDLES 5/16") 30G X 8 MM MISC Use to inject insulin 5x daily. 200 each 11  . Lancet Devices MISC Use as directed to monitor FSBS 3x daily. Dx: E11.9. Please dispense TruePlus Ultra Thin. 1 each 1  . Lancets MISC Use as directed to monitor FSBS 3x daily. Dx: E11.9. Please dispense TruePlus Ultra Thin. 300 each 3  . LANTUS SOLOSTAR 100 UNIT/ML Solostar Pen INJECT 45 UNITS INTO THE SKIN 2 (TWO) TIMES DAILY. 30 pen 1  . levothyroxine (SYNTHROID, LEVOTHROID) 100 MCG tablet TAKE 1 TABLET EVERY DAY 30 tablet 1  . losartan-hydrochlorothiazide (HYZAAR) 50-12.5 MG tablet TAKE 1 TABLET EVERY DAY 30 tablet 1  . lubiprostone (AMITIZA) 24 MCG capsule Take 1 capsule (24 mcg total) by mouth 2 (two) times daily with a meal. 6 capsule 0  . meloxicam (MOBIC) 15 MG tablet 1 TABLET DAILY AS NEEDED 90 tablet 4  . metFORMIN (GLUCOPHAGE) 850 MG tablet Take 1 tablet (850 mg total) by mouth 2 (two) times daily with a meal. 180 tablet 3  . Misc Natural Products (LAXATIVE FORMULA) TABS Take 1 tablet by mouth as needed (for BM).    . naproxen (NAPROSYN) 500 MG tablet Take 1 tablet (500 mg total) by mouth 2 (two) times daily. 30 tablet 0  . ondansetron (ZOFRAN ODT) 4 MG disintegrating tablet Take 1 tablet (4 mg total) by mouth every 8 (eight) hours as needed for nausea or vomiting. 20 tablet 0  . ondansetron (ZOFRAN) 4 MG tablet Take 1 tablet (4 mg total) by mouth every 6 (six) hours. 36 tablet 3  . oxyCODONE-acetaminophen (PERCOCET/ROXICET) 5-325 MG tablet 1 tablet every 6 (six) hours as needed for moderate pain.     .  pantoprazole (PROTONIX) 40 MG tablet Take 1 tablet (40 mg total) by mouth daily. 30 tablet 3  . pregabalin (LYRICA) 150 MG capsule Take 1 capsule (150 mg total) by mouth 2 (two) times daily. 60 capsule 2  . tamsulosin (FLOMAX) 0.4 MG CAPS capsule Take 1 capsule (0.4 mg total) by mouth daily. 30 capsule 0   No current facility-administered medications on file prior to visit.   Allergies  Allergen Reactions  . Dilaudid [Hydromorphone Hcl] Nausea And Vomiting  . Adhesive [Tape] Rash  . Ampicillin Rash    REACTION: Rash and welts on her hands when she took it in the hospital  . Penicillins Rash   Social History   Social History  . Marital Status: Married    Spouse Name: N/A  . Number of Children: 1  . Years of Education: N/A   Occupational History  . Retired since 2003    Social History Main Topics  . Smoking status: Never Smoker   . Smokeless tobacco: Never Used  . Alcohol Use: No  . Drug Use: No  . Sexual Activity: Not on file   Other Topics Concern  . Not on file   Social History Narrative      Review of Systems  All other systems reviewed and are negative.      Objective:   Physical Exam  Constitutional: She appears well-developed and well-nourished. No distress.  Neck: No JVD present.  Cardiovascular: Normal rate, regular rhythm and normal heart sounds.   Pulmonary/Chest: Effort normal. She has no wheezes. She has no rales. She exhibits tenderness.  Abdominal: Soft. Bowel sounds are normal. She exhibits no distension. There is no tenderness. There is no rebound and no guarding.  Musculoskeletal: She exhibits no edema.  Skin: She is not diaphoretic.  Vitals reviewed.         Assessment & Plan:  Polyarthralgia  Weakness  Dizziness Patient seems to be improving. Her blood pressure today is elevated the last time it was perfect somewhat have the patient check her blood pressure frequently at home and recheck next week and if persistently high I will  increase medication at that point. Her weakness is improving after stopping the statins. Her joint pain is improving after stopping her statin. Her muscle pains are improving after stopping her statin. Therefore I believe the positive ANA is likely a false positive. If symptoms worsen, I would consider a referral to a rheumatologist for an evaluation for lupus however I believe this is unlikely given her normal sedimentation rate. Another possibility given her costochondritis and her leg weakness and muscle pains may have been some kind of post viral syndrome along the lines of a Guillain-Barr. However the patient is much better today there is no evidence of paralysis on her exam or numbness on her exam suspect this is highly unlikely. I will recheck her next week. She's been off the statin now for 9 days and her symptoms are much better. Hopefully in one week her symptoms be completely better. At that time we will discuss her blood sugars and her blood pressure

## 2015-09-25 ENCOUNTER — Ambulatory Visit (INDEPENDENT_AMBULATORY_CARE_PROVIDER_SITE_OTHER): Payer: Commercial Managed Care - HMO | Admitting: Family Medicine

## 2015-09-25 VITALS — BP 142/78 | HR 82 | Temp 97.8°F | Resp 18 | Wt 200.0 lb

## 2015-09-25 DIAGNOSIS — I10 Essential (primary) hypertension: Secondary | ICD-10-CM | POA: Diagnosis not present

## 2015-09-25 DIAGNOSIS — R5383 Other fatigue: Secondary | ICD-10-CM | POA: Diagnosis not present

## 2015-09-25 DIAGNOSIS — E1100 Type 2 diabetes mellitus with hyperosmolarity without nonketotic hyperglycemic-hyperosmolar coma (NKHHC): Secondary | ICD-10-CM

## 2015-09-25 DIAGNOSIS — R42 Dizziness and giddiness: Secondary | ICD-10-CM

## 2015-09-25 MED ORDER — INSULIN NPH ISOPHANE & REGULAR (70-30) 100 UNIT/ML ~~LOC~~ SUSP: SUBCUTANEOUS | Status: DC

## 2015-09-25 NOTE — Progress Notes (Signed)
 Subjective:    Patient ID: [name], female    DOB: 05/14/1950, 65 y.o.   MRN: [id number]  HPI 09/03/15 Patient has a past medical history of nonischemic cardiomyopathy with an ejection fraction of 40-45% in 2007. Follow-up echocardiograms have revealed improvement in her most recent echocardiogram last year revealed an ejection fraction of 50-55%. Patient had a cardiac catheterization in 2007 that showed normal coronary arteries. Last week, she reports abdominal bloating, heartburn, reflux type symptoms. She was taking frequent Alka-Seltzer's. Suddenly over the weekend, she developed severe 10 out of 10 chest pain over the left sternal border. She went to emergency room her d-dimer was slightly elevated. CT scan of the chest revealed no pulmonary embolism. Troponins were negative 3 and EKGs were normal. She was discharged home with a diagnosis of chest wall pain. The pain returned the following day and she went to an urgent care where they diagnosed her with chest wall pain and she was started on Valium and an anti-inflammatory. She continues to have pain that is reproducible at the left sternal border consistent with costochondritis. She also reports mild pleurisy. She reports some shortness of breath with activity. The pain radiates into her back. She is also continuing to have heartburn. Her blood pressure significantly elevated today.  I reviewed her EKG from 5/23 which revealed no ischemic changes. Furthermore there is no ST segment changes consistent with pericarditis.  At that time, my plan was: Most likely this represents chest wall pain and the reproducible nature at the costochondral junction is consistent with costochondritis. I recommended that she use diclofenac 75 mg by mouth twice a day with Valium 5-10 mg every 8 hours as needed for muscle pain. GI sources are also a possibility. Therefore I will start the patient on protonix 40 mg by mouth daily and consider an EGD if pain  persists. The pain does not sound like esophageal spasm. Her blood pressure is extremely high today and I will add amlodipine 10 mg by mouth daily and recheck the patient the first of next week to make sure her blood pressure is better. I will also consult cardiology given her age and numerous risk factors. She may need a stress test particularly given the shortness of breath and dyspnea on exertion with her chest pain which seems a bit extreme for costochondritis.  09/08/15 Thankfully the patient's chest pain is better. Her blood pressure today is much better. However she looks much worse. She is now reporting diffuse arthralgias in her lower back, in both of her hips and thighs.  She reports weakness. She is having a difficult time even getting up out of a chair. Her legs are not strong enough to push herself to a stand. She also reports diffuse arthralgias in all of the joints of her hands including the DIP joints, the PIP joints, and MCP joints. There is no active synovitis. There is no erythema or warmth or swelling in the joints however she has subjective pain there. Her pain is extremely widespread and she is extremely fatigued and tired. Symptoms really started in earnest approximately 1 week ago [place] were [place] in trees at that point. They denied any tick bite. Of note she also recently started Lipitor for hyperlipidemia. Her weight is unchanged and she is afebrile.  At that time, my plan was: This appears to be some type of systemic illness. I no longer believe that the patient only is suffering from costochondritis. In   any respect, her chest wall pain has gotten better however she is now reporting diffuse myalgias and arthralgias all over her body. Given the time year, I'm concerned about tickborne illnesses like Lyme disease. I will cover her with doxycycline 100 mg by mouth twice a day for 10 days while awaiting tickborne titers.  Check a sedimentation rate, and ANA, and  rheumatoid factor to evaluate for autoimmune diseases. I'm also concerned about statin-induced myopathy. I will have the patient discontinue Lipitor and I will check a CK level.  09/18/15 No visits with results within 1 Week(s) from this visit. Latest known visit with results is:  Abstract on 09/09/2015  Component Date Value Ref Range Status  . HM Mammogram 08/13/2015 Self Reported Normal  0-4 Bi-Rad, Self Reported Normal Final   Labs returned significant only for a faintly positive ANA in a speckled pattern that disappeared after a 1-40 dilution. Patient states that she is doing much better than she was last time after stopping Lipitor and starting the doxycycline. Lab work shows no evidence of [facility] spotted fever or Lyme disease. The muscle weakness in the muscle pains particularly in her hips and thighs and legs have improved dramatically and have gone from a 8 on a scale of 10 down to a 2 or 3 on a scale of 10. Her strength is much better. Last time she was having to walk into the clinic using a walker due to weakness. She cannot even stand up out of a chair due to weakness. Now she is able to stand up without assistance. The strength in her legs is much better. Her biggest concern at this time is persistent dizziness. She still feels unsteady on her feet. She is not sure if that is due to the weakness in her legs or just plain dizziness. She has normal sensation in her legs and there is no evidence of paralysis today on her exam. She denies any vertigo. Muscle strength is 5 over 5 equal and symmetric in both legs. She has no muscle weakness in her arms. There is no obvious neurologic deficit although she does feel unsteady when she is walking. While the patient is here today I performed a diabetic foot exam. She has diminished sensation to 10 g monofilament in 2 out of the 4 locations checked primarily on the ball of the foot on the plantar surface of the first MTP joint as well as on the tips  of the great toe. She has pre-ulcerative calluses forming on the medial aspect of the first MTP joints bilaterally. She does have neuropathy.  At that time, my plan was: Patient seems to be improving. Her blood pressure today is elevated.  The last time it was perfect, so I will have the patient check her blood pressure frequently at home and recheck next week and if persistently high I will increase medication at that point. Her weakness is improving after stopping the statins. Her joint pain is improving after stopping her statin. Her muscle pains are improving after stopping her statin. Therefore I believe the positive ANA is likely a false positive. If symptoms worsen, I would consider a referral to a rheumatologist for an evaluation for lupus however I believe this is unlikely given her normal sedimentation rate. Another possibility given her costochondritis and her leg weakness and muscle pains may have been some kind of post viral syndrome along the lines of a Guillain-Barr. However the patient is much better today there is no evidence of paralysis  on her exam or numbness on her exam suspect this is highly unlikely. I will recheck her next week. She's been off the statin now for 9 days and her symptoms are much better. Hopefully in one week her symptoms be completely better. At that time we will discuss her blood sugars and her blood pressure  09/25/15 Her blood pressure today is much better. Her blood pressure at home is in keeping with a number check today. She is between 130 and 140/60-70. I believe these blood pressure values are acceptable. Her fasting blood sugars however are 170-200. She is not checking postprandial sugars. She admits that she's not taking her Lantus regularly. On days that she takes it she takes 45 units twice a day. However she will frequently skip days because she cannot afford the Lantus. Now that she is in the donut hole it's going to cost her $700. She is also not taking the  glyxambi regularly because it is causing her just as much. Therefore her sugars fluctuate wildly depending on whether she has or hasn't taken her medication. The joint pains and muscle pains have stopped. However she continues to complain of severe dizziness. She is having trouble with a cane due to her disequilibrium. 6 months ago she did not have to walk with a cane. This is not improving. She denies any vertigo. She denies any unilateral muscle weakness slurred speech, or facial droop but the dizziness is certainly abnormal for this patient. Given her history of hypertension and uncontrolled diabetes she is certainly at higher risk for stroke and possibly this could cause her dizziness if it was in the posterior circulation.  Past Medical History  Diagnosis Date  . GERD (gastroesophageal reflux disease)   . Hypothyroidism   . Peripheral neuropathy ([place])   . Cardiomyopathy   . Chest pain 06/2005    Hospitalized, dystolic dysfunction  . Sciatica   . Type II diabetes mellitus ([place])   . PONV (postoperative nausea and vomiting)   . H/O hiatal hernia   . Migraines     "it's been a long time since I've had one" (03/20/2012)  . Osteoarthritis   . Arthritis     "all over; mostly in my back" (03/20/2012)  . Chronic lower back pain     "have 2 herniated discs; going to have to have a fusion" (03/20/2012)  . Breast cancer ([place])     "right" (03/20/2012)  . Family history of anesthesia complication     "father has nausea and vomiting"  . Irritable bowel syndrome   . Hypertension     "patient states never had HTN, takes Hyzaar for heart  . History of right mastectomy   . Anginal pain ([place]) 03/19/2012    saw Dr. Cathie Olden .. she thinks its more related to stomach issues  . History of kidney stones    Past Surgical History  Procedure Laterality Date  . Abdominal hysterectomy  1993  . Total knee arthroplasty  2000    Right  . Mastectomy with axillary lymph node dissection  12/2003    "right"  (03/20/2012)  . Thyroidectomy  1977    Right  . Posterior cervical fusion/foraminotomy  2001  . Shoulder arthroscopy w/ rotator cuff repair  2003    Left  . Cardiac catheterization  ? 2005 / 2007  . Adenosin cardiolite  07/2005    (+) wall motion abnormality  . Esophagogastroduodenoscopy  2005    GERD  . [place] of abdomen  07/2005  Fatty liver / kidney stones  . Ct abdomen and pelvis  02/2010    Same  . Knee arthroscopy  1990's    "right" (03/20/2012)  . Axillary lymph node dissection  2003    squamous cell cancer  . Back surgery    . Lumbar laminectomy/decompression microdiscectomy N/A 02/04/2014    Procedure: RIGHT L2-3 MICRODISCECTOMY ;  Surgeon: Jessy Oto, MD;  Location: [place];  Service: Orthopedics;  Laterality: N/A;  . Lumbar fusion N/A 08/22/2014    Procedure: Right L2-3 and right L1-2 transforaminal lumbar interbody fusion with pedicle screws, rods, sleeves, cages, local bone graft, Vivigen, cancellous chips;  Surgeon: Jessy Oto, MD;  Location: [place];  Service: Orthopedics;  Laterality: N/A;   Current Outpatient Prescriptions on File Prior to Visit  Medication Sig Dispense Refill  . alendronate (FOSAMAX) 70 MG tablet Take 1 tablet (70 mg total) by mouth every Monday. Reported on 05/27/2015 12 tablet 3  . amLODipine (NORVASC) 10 MG tablet Take 1 tablet (10 mg total) by mouth daily. 90 tablet 3  . aspirin 81 MG tablet Take 81 mg by mouth daily.    [name] Kitchen atorvastatin (LIPITOR) 10 MG tablet Take 1 tablet (10 mg total) by mouth daily. 90 tablet 3  . Blood Glucose Calibration (TRUE METRIX LEVEL 2) Normal SOLN Use as directed. 3 each 3  . Blood Glucose Monitoring Suppl (BLOOD GLUCOSE SYSTEM PAK) KIT Use as directed to monitor FSBS 3x daily. Dx: E11.9. Please dispense [employer] True Metrix. 1 each 1  . Cholecalciferol (VITAMIN D3) 5000 UNITS CAPS Take 5,000 Units by mouth 2 (two) times daily.    . diazepam (VALIUM) 10 MG tablet Take 1 tablet (10 mg total) by mouth every 8 (eight) hours  as needed (for muscle pain). 30 tablet 1  . diclofenac (VOLTAREN) 75 MG EC tablet Take 1 tablet (75 mg total) by mouth 2 (two) times daily. 30 tablet 0  . docusate sodium (COLACE) 100 MG capsule Take 1 capsule (100 mg total) by mouth every 12 (twelve) hours. 60 capsule 0  . doxycycline (VIBRA-TABS) 100 MG tablet Take 1 tablet (100 mg total) by mouth 2 (two) times daily. 20 tablet 0  . fluticasone (FLONASE) 50 MCG/ACT nasal spray Place 2 sprays into both nostrils daily. 48 g 3  . Glucose Blood (BLOOD GLUCOSE TEST STRIPS) STRP Use as directed to monitor FSBS 3x daily. Dx: E11.9. Please dispense [employer] True Metrix. 300 each 3  . GLYXAMBI 25-5 MG TABS Take 1 tablet by mouth daily.    [name] Kitchen HYDROcodone-acetaminophen (NORCO) 7.5-325 MG per tablet TAKE 1 TO 2 TABLETS BY MOUTH EVERY 4 TO 6 HOURS AS NEEDED FOR PAIN MAXIMUM 8 TABLETS PER DAY  0  . insulin aspart (NOVOLOG) 100 UNIT/ML FlexPen Inject 10 Units into the skin 3 (three) times daily with meals. 5 pen 11  . Insulin Pen Needle (PEN NEEDLES 5/16") 30G X 8 MM MISC Use to inject insulin 5x daily. 200 each 11  . Lancet Devices MISC Use as directed to monitor FSBS 3x daily. Dx: E11.9. Please dispense TruePlus Ultra Thin. 1 each 1  . Lancets MISC Use as directed to monitor FSBS 3x daily. Dx: E11.9. Please dispense TruePlus Ultra Thin. 300 each 3  . LANTUS SOLOSTAR 100 UNIT/ML Solostar Pen INJECT 45 UNITS INTO THE SKIN 2 (TWO) TIMES DAILY. 30 pen 1  . levothyroxine (SYNTHROID, LEVOTHROID) 100 MCG tablet TAKE 1 TABLET EVERY DAY 30 tablet 1  . losartan-hydrochlorothiazide (HYZAAR) 50-12.5 MG  tablet TAKE 1 TABLET EVERY DAY 30 tablet 1  . lubiprostone (AMITIZA) 24 MCG capsule Take 1 capsule (24 mcg total) by mouth 2 (two) times daily with a meal. 6 capsule 0  . meloxicam (MOBIC) 15 MG tablet 1 TABLET DAILY AS NEEDED 90 tablet 4  . metFORMIN (GLUCOPHAGE) 850 MG tablet Take 1 tablet (850 mg total) by mouth 2 (two) times daily with a meal. 180 tablet 3  . Misc Natural  Products (LAXATIVE FORMULA) TABS Take 1 tablet by mouth as needed (for BM).    . naproxen (NAPROSYN) 500 MG tablet Take 1 tablet (500 mg total) by mouth 2 (two) times daily. 30 tablet 0  . ondansetron (ZOFRAN ODT) 4 MG disintegrating tablet Take 1 tablet (4 mg total) by mouth every 8 (eight) hours as needed for nausea or vomiting. 20 tablet 0  . ondansetron (ZOFRAN) 4 MG tablet Take 1 tablet (4 mg total) by mouth every 6 (six) hours. 36 tablet 3  . oxyCODONE-acetaminophen (PERCOCET/ROXICET) 5-325 MG tablet 1 tablet every 6 (six) hours as needed for moderate pain.     . pantoprazole (PROTONIX) 40 MG tablet Take 1 tablet (40 mg total) by mouth daily. 30 tablet 3  . pregabalin (LYRICA) 150 MG capsule Take 1 capsule (150 mg total) by mouth 2 (two) times daily. 60 capsule 2  . tamsulosin (FLOMAX) 0.4 MG CAPS capsule Take 1 capsule (0.4 mg total) by mouth daily. 30 capsule 0   No current facility-administered medications on file prior to visit.   Allergies  Allergen Reactions  . Dilaudid [Hydromorphone Hcl] Nausea And Vomiting  . Adhesive [Tape] Rash  . Ampicillin Rash    REACTION: Rash and welts on her hands when she took it in the hospital  . Penicillins Rash   Social History   Social History  . Marital Status: Married    Spouse Name: N/A  . Number of Children: 1  . Years of Education: N/A   Occupational History  . Retired since 2003    Social History Main Topics  . Smoking status: Never Smoker   . Smokeless tobacco: Never Used  . Alcohol Use: No  . Drug Use: No  . Sexual Activity: Not on file   Other Topics Concern  . Not on file   Social History Narrative      Review of Systems  All other systems reviewed and are negative.      Objective:   Physical Exam  Constitutional: She is oriented to person, place, and time. She appears well-developed and well-nourished. No distress.  Neck: No JVD present.  Cardiovascular: Normal rate, regular rhythm and normal heart sounds.    Pulmonary/Chest: Effort normal. She has no wheezes. She has no rales. She exhibits tenderness.  Abdominal: Soft. Bowel sounds are normal. She exhibits no distension. There is no tenderness. There is no rebound and no guarding.  Musculoskeletal: She exhibits no edema.  Neurological: She is alert and oriented to person, place, and time. She has normal reflexes. She displays normal reflexes. No cranial nerve deficit. She exhibits normal muscle tone. Coordination abnormal.  Skin: She is not diaphoretic.  Vitals reviewed.         Assessment & Plan:   Uncontrolled type 2 diabetes mellitus with hyperosmolarity without coma, without long-term current use of insulin (HCC) - Plan: insulin NPH-regular Human (NOVOLIN 70/30) (70-30) 100 UNIT/ML injection, MR Brain W Wo Contrast  Essential hypertension - Plan: MR Brain W Wo Contrast  Severe dizziness - Plan:  MR Brain W Wo Contrast  Other fatigue  I'm willing to except the blood pressure. However her sugars are not well controlled nor will they be on the current regimen and she cannot afford that. Therefore we are going to discontinue lantus and glyxambi and replace with novolin 70/30 60 units in the morning and 30 units with supper and recheck fasting blood sugars and two-hour postprandial sugars in one week so that we can titrate further. I'm going to schedule the patient for an MRI of the brain to rule out a stroke in the posterior circulation which could possibly explain her abnormal coordination and dizziness.

## 2015-10-07 ENCOUNTER — Other Ambulatory Visit: Payer: Self-pay | Admitting: Family Medicine

## 2015-10-07 ENCOUNTER — Ambulatory Visit
Admission: RE | Admit: 2015-10-07 | Discharge: 2015-10-07 | Disposition: A | Discharge status: Home / Self Care | Payer: Commercial Managed Care - HMO | Source: Ambulatory Visit | Attending: Family Medicine | Admitting: Family Medicine

## 2015-10-07 DIAGNOSIS — I10 Essential (primary) hypertension: Secondary | ICD-10-CM

## 2015-10-07 DIAGNOSIS — R42 Dizziness and giddiness: Secondary | ICD-10-CM

## 2015-10-07 DIAGNOSIS — E1100 Type 2 diabetes mellitus with hyperosmolarity without nonketotic hyperglycemic-hyperosmolar coma (NKHHC): Secondary | ICD-10-CM

## 2015-10-07 MED ORDER — GADOBENATE DIMEGLUMINE 529 MG/ML IV SOLN
19.0000 mL | Freq: Once | INTRAVENOUS | Status: AC | PRN
  Administered 2015-10-07: 19 mL via INTRAVENOUS

## 2015-10-08 ENCOUNTER — Other Ambulatory Visit: Payer: Self-pay | Admitting: Family Medicine

## 2015-10-08 MED ORDER — CEFDINIR 300 MG PO CAPS: 300.0000 mg | ORAL_CAPSULE | Freq: Two times a day (BID) | ORAL | Status: DC

## 2015-10-14 ENCOUNTER — Telehealth: Payer: Self-pay | Admitting: Family Medicine

## 2015-10-14 DIAGNOSIS — M4316 Spondylolisthesis, lumbar region: Secondary | ICD-10-CM | POA: Diagnosis not present

## 2015-10-14 NOTE — Telephone Encounter (Signed)
Pt at Ortho for routine follow ups for back pain.  Needs [employer] referral.  Submitted and approved Auth # [id number] 6 visits 10/14/15-04/11/16.  Ortho office given referral information

## 2015-10-26 NOTE — Progress Notes (Signed)
Cardiology Office Note   Date:  10/27/2015   ID:  [name] 06/08/1950, MRN [id number]  PCP:  Odette Fraction, MD  Cardiologist:  [name], MD    Chief Complaint  Patient presents with  . New Patient (Initial Visit)    chest pain     History of Present Illness: [name] is a 65 y.o. female who presents today for cardiology evaluation.   Hx HTN, DM uncontrolled.  She says the beginning of June, she had an episode of body aches that were severe and all over her body. She eventually was taken off of her Lipitor, which has significantly helped her symptoms. She does say that she gets some amount of chest pain when she exerts herself. The pain is in the center for chest and relieved when she rests. She also says that she has a different pain in her chest that occurs most of the time. She is getting some lower extremity edema at times. She also says that she snores at night and stops breathing when she sleeps at times.   Today, she denies symptoms of palpitations, shortness of breath, orthopnea, PND, claudication, dizziness, presyncope, syncope, bleeding, or neurologic sequela. The patient is tolerating medications without difficulties and is otherwise without complaint today.    Past Medical History  Diagnosis Date  . GERD (gastroesophageal reflux disease)   . Hypothyroidism   . Peripheral neuropathy ([place])   . Cardiomyopathy   . Chest pain 06/2005    Hospitalized, dystolic dysfunction  . Sciatica   . Type II diabetes mellitus ([place])   . PONV (postoperative nausea and vomiting)   . H/O hiatal hernia   . Migraines     "it's been a long time since I've had one" (03/20/2012)  . Osteoarthritis   . Arthritis     "all over; mostly in my back" (03/20/2012)  . Chronic lower back pain     "have 2 herniated discs; going to have to have a fusion" (03/20/2012)  . Breast cancer ([place])     "right" (03/20/2012)  . Family history of anesthesia complication       "father has nausea and vomiting"  . Irritable bowel syndrome   . Hypertension     "patient states never had HTN, takes Hyzaar for heart  . History of right mastectomy   . Anginal pain ([place]) 03/19/2012    saw Dr. Cathie Olden .. she thinks its more related to stomach issues  . History of kidney stones    Past Surgical History  Procedure Laterality Date  . Abdominal hysterectomy  1993  . Total knee arthroplasty  2000    Right  . Mastectomy with axillary lymph node dissection  12/2003    "right" (03/20/2012)  . Thyroidectomy  1977    Right  . Posterior cervical fusion/foraminotomy  2001  . Shoulder arthroscopy w/ rotator cuff repair  2003    Left  . Cardiac catheterization  ? 2005 / 2007  . Adenosin cardiolite  07/2005    (+) wall motion abnormality  . Esophagogastroduodenoscopy  2005    GERD  . [place] of abdomen  07/2005    Fatty liver / kidney stones  . Ct abdomen and pelvis  02/2010    Same  . Knee arthroscopy  1990's    "right" (03/20/2012)  . Axillary lymph node dissection  2003    squamous cell cancer  . Back surgery    . Lumbar laminectomy/decompression microdiscectomy N/A 02/04/2014  Procedure: RIGHT L2-3 MICRODISCECTOMY ;  Surgeon: Jessy Oto, MD;  Location: [facility];  Service: Orthopedics;  Laterality: N/A;  . Lumbar fusion N/A 08/22/2014    Procedure: Right L2-3 and right L1-2 transforaminal lumbar interbody fusion with pedicle screws, rods, sleeves, cages, local bone graft, Vivigen, cancellous chips;  Surgeon: Jessy Oto, MD;  Location: [facility];  Service: Orthopedics;  Laterality: N/A;     Current Outpatient Prescriptions  Medication Sig Dispense Refill  . acetaminophen (TYLENOL) 325 MG tablet Take 650 mg by mouth every 6 (six) hours as needed.    [name] Kitchen alendronate (FOSAMAX) 70 MG tablet Take 1 tablet (70 mg total) by mouth every Monday. Reported on 05/27/2015 12 tablet 3  . aspirin 81 MG tablet Take 81 mg by mouth daily.    . Blood Glucose Calibration (TRUE METRIX LEVEL  2) Normal SOLN Use as directed. 3 each 3  . Blood Glucose Monitoring Suppl (BLOOD GLUCOSE SYSTEM PAK) KIT Use as directed to monitor FSBS 3x daily. Dx: E11.9. Please dispense [employer] True Metrix. 1 each 1  . cefdinir (OMNICEF) 300 MG capsule Take 1 capsule (300 mg total) by mouth 2 (two) times daily. 14 capsule 0  . Cholecalciferol (VITAMIN D3) 5000 UNITS CAPS Take 5,000 Units by mouth 2 (two) times daily.    . diazepam (VALIUM) 10 MG tablet Take 1 tablet (10 mg total) by mouth every 8 (eight) hours as needed (for muscle pain). 30 tablet 1  . diclofenac (VOLTAREN) 75 MG EC tablet Take 1 tablet (75 mg total) by mouth 2 (two) times daily. 30 tablet 0  . docusate sodium (COLACE) 100 MG capsule Take 1 capsule (100 mg total) by mouth every 12 (twelve) hours. 60 capsule 0  . doxycycline (VIBRA-TABS) 100 MG tablet Take 1 tablet (100 mg total) by mouth 2 (two) times daily. 20 tablet 0  . fluticasone (FLONASE) 50 MCG/ACT nasal spray Place 2 sprays into both nostrils daily. 48 g 3  . Glucose Blood (BLOOD GLUCOSE TEST STRIPS) STRP Use as directed to monitor FSBS 3x daily. Dx: E11.9. Please dispense [employer] True Metrix. 300 each 3  . GLYXAMBI 25-5 MG TABS Take 1 tablet by mouth daily.    [name] Kitchen HYDROcodone-acetaminophen (NORCO) 7.5-325 MG per tablet TAKE 1 TO 2 TABLETS BY MOUTH EVERY 4 TO 6 HOURS AS NEEDED FOR PAIN MAXIMUM 8 TABLETS PER DAY  0  . insulin aspart (NOVOLOG) 100 UNIT/ML FlexPen Inject 10 Units into the skin 3 (three) times daily with meals. 5 pen 11  . insulin NPH-regular Human (NOVOLIN 70/30) (70-30) 100 UNIT/ML injection Inject 60 units with breakfast, Inject 30 units with dinner 30 mL 11  . Insulin Pen Needle (PEN NEEDLES 5/16") 30G X 8 MM MISC Use to inject insulin 5x daily. 200 each 11  . Lancet Devices MISC Use as directed to monitor FSBS 3x daily. Dx: E11.9. Please dispense TruePlus Ultra Thin. 1 each 1  . Lancets MISC Use as directed to monitor FSBS 3x daily. Dx: E11.9. Please dispense TruePlus  Ultra Thin. 300 each 3  . LANTUS SOLOSTAR 100 UNIT/ML Solostar Pen INJECT 45 UNITS INTO THE SKIN 2 (TWO) TIMES DAILY. 30 pen 1  . levothyroxine (SYNTHROID, LEVOTHROID) 100 MCG tablet TAKE 1 TABLET EVERY DAY 30 tablet 1  . losartan-hydrochlorothiazide (HYZAAR) 50-12.5 MG tablet TAKE 1 TABLET EVERY DAY 30 tablet 1  . lubiprostone (AMITIZA) 24 MCG capsule Take 1 capsule (24 mcg total) by mouth 2 (two) times daily with a meal. 6  capsule 0  . meloxicam (MOBIC) 15 MG tablet 1 TABLET DAILY AS NEEDED 90 tablet 4  . metFORMIN (GLUCOPHAGE) 850 MG tablet Take 1 tablet (850 mg total) by mouth 2 (two) times daily with a meal. 180 tablet 3  . Misc Natural Products (LAXATIVE FORMULA) TABS Take 1 tablet by mouth as needed (for BM).    . naproxen (NAPROSYN) 500 MG tablet Take 1 tablet (500 mg total) by mouth 2 (two) times daily. 30 tablet 0  . ondansetron (ZOFRAN ODT) 4 MG disintegrating tablet Take 1 tablet (4 mg total) by mouth every 8 (eight) hours as needed for nausea or vomiting. 20 tablet 0  . ondansetron (ZOFRAN) 4 MG tablet Take 1 tablet (4 mg total) by mouth every 6 (six) hours. 36 tablet 3  . oxyCODONE-acetaminophen (PERCOCET/ROXICET) 5-325 MG tablet 1 tablet every 6 (six) hours as needed for moderate pain.     . pantoprazole (PROTONIX) 40 MG tablet Take 1 tablet (40 mg total) by mouth daily. 30 tablet 3  . pregabalin (LYRICA) 150 MG capsule Take 1 capsule (150 mg total) by mouth 2 (two) times daily. 60 capsule 2  . tamsulosin (FLOMAX) 0.4 MG CAPS capsule Take 1 capsule (0.4 mg total) by mouth daily. 30 capsule 0   No current facility-administered medications for this visit.    Allergies:   Dilaudid; Adhesive; Ampicillin; and Penicillins   Social History:  The patient  reports that she has never smoked. She has never used smokeless tobacco. She reports that she does not drink alcohol or use illicit drugs.   Family History:  The patient's family history includes Diabetes in her brother, father, and  other; Hypertension in her brother, brother, father, mother, other, and sister.    ROS:  Please see the history of present illness.   Otherwise, review of systems is positive for Chest pain, fatigue, cough, dyspnea on exertion, abdominal pain, constipation, nausea vomiting, joint swelling, back pain, muscle pain, dizziness, headaches.   All other systems are reviewed and negative.    PHYSICAL EXAM: VS:  BP 142/80 mmHg  Pulse 58  Ht '5\' 5"'  (1.651 m)  Wt 201 lb 12.8 oz (91.536 kg)  BMI 33.58 kg/m2 , BMI Body mass index is 33.58 kg/(m^2). GEN: Well nourished, well developed, in no acute distress HEENT: normal Neck: no JVD, carotid bruits, or masses Cardiac: RRR; no murmurs, rubs, or gallops,no edema  Respiratory:  clear to auscultation bilaterally, normal work of breathing GI: soft, nontender, nondistended, + BS MS: no deformity or atrophy Skin: warm and dry Neuro:  Strength and sensation are intact Psych: euthymic mood, full affect  EKG:  EKG is not ordered today.  Recent Labs: 05/27/2015: ALT 17; TSH 2.78 08/30/2015: B Natriuretic Peptide 21.9 08/31/2015: BUN 16; Creatinine, Ser 0.76; Hemoglobin 13.5; Platelets 251; Potassium 3.9; Sodium 139    Lipid Panel     Component Value Date/Time   CHOL 201* 05/27/2015 0933   TRIG 157* 05/27/2015 0933   HDL 48 05/27/2015 0933   CHOLHDL 4.2 05/27/2015 0933   VLDL 31* 05/27/2015 0933   LDLCALC 122 05/27/2015 0933     Wt Readings from Last 3 Encounters:  10/27/15 201 lb 12.8 oz (91.536 kg)  09/25/15 200 lb (90.719 kg)  09/18/15 202 lb (91.627 kg)      Other studies Reviewed: Additional studies/ records that were reviewed today include: TTE 11/17/14  Review of the above records today demonstrates:  Left ventricle: The cavity size was normal. Systolic function was  normal. The estimated ejection fraction was in the range of 50%  to 55%. Wall motion was normal; there were no regional wall  motion abnormalities. Doppler parameters  are consistent with  abnormal left ventricular relaxation (grade 1 diastolic  dysfunction). - Aortic valve: There was trivial regurgitation. - Mitral valve: There was mild regurgitation    ASSESSMENT AND PLAN:  1.  Chest pain:  currently, she has symptoms that could be consistent with cardiac chest pain. She has pain in the center of her chest with exertion that is relieved with rest. Due to her symptoms, we'll order for a Lexiscan Myoview to determine if she does have cardiac causes. She also is having lower extremity edema and this Malaina Mortellaro give [place] an ejection fraction which could include Levsin to the cause of her edema.   2. Snoring : Currently her husband says that she snores and stops breathing at night. We'll order a sleep study.     Current medicines are reviewed at length with the patient today.   The patient does not have concerns regarding her medicines.  The following changes were made today:  none  Labs/ tests ordered today include:  Orders Placed This Encounter  Procedures  . Myocardial Perfusion Imaging  . Split night study     Disposition:   FU with Camdyn Beske pending stress testing  Signed, Laurabelle Gorczyca Meredith Leeds, MD  10/27/2015 10:51 AM     [facility] [address] [place] [address] [phone] (office) [phone] (fax)

## 2015-10-27 ENCOUNTER — Encounter: Payer: Self-pay | Admitting: Family Medicine

## 2015-10-27 ENCOUNTER — Encounter (INDEPENDENT_AMBULATORY_CARE_PROVIDER_SITE_OTHER): Payer: Self-pay

## 2015-10-27 ENCOUNTER — Ambulatory Visit (INDEPENDENT_AMBULATORY_CARE_PROVIDER_SITE_OTHER): Payer: Commercial Managed Care - HMO | Admitting: Cardiology

## 2015-10-27 ENCOUNTER — Encounter: Payer: Self-pay | Admitting: Cardiology

## 2015-10-27 ENCOUNTER — Ambulatory Visit (INDEPENDENT_AMBULATORY_CARE_PROVIDER_SITE_OTHER): Payer: Commercial Managed Care - HMO | Admitting: Family Medicine

## 2015-10-27 VITALS — BP 138/88 | Temp 98.3°F | Wt 202.0 lb

## 2015-10-27 VITALS — BP 142/80 | HR 58 | Ht 65.0 in | Wt 201.8 lb

## 2015-10-27 DIAGNOSIS — H9312 Tinnitus, left ear: Secondary | ICD-10-CM | POA: Diagnosis not present

## 2015-10-27 DIAGNOSIS — R42 Dizziness and giddiness: Secondary | ICD-10-CM | POA: Diagnosis not present

## 2015-10-27 DIAGNOSIS — R079 Chest pain, unspecified: Secondary | ICD-10-CM

## 2015-10-27 DIAGNOSIS — R0683 Snoring: Secondary | ICD-10-CM

## 2015-10-27 NOTE — Patient Instructions (Addendum)
Medication Instructions:  Your physician recommends that you continue on your current medications as directed. Please refer to the Current Medication list given to you today.  --- If you need a refill on your cardiac medications before your next appointment, please call your pharmacy. ---  Labwork: None ordered  Testing/Procedures: Your physician has requested that you have a lexiscan myoview. For further information please visit [email or web address]. Please follow instruction sheet, as given.  Your physician has recommended that you have a sleep study. This test records several body functions during sleep, including: brain activity, eye movement, oxygen and carbon dioxide blood levels, heart rate and rhythm, breathing rate and rhythm, the flow of air through your mouth and nose, snoring, body muscle movements, and chest and belly movement.  Follow-Up: To be determined once lexiscan testing is reviewed by the physician.  We will call you with the results.  Thank you for choosing [facility]!!    Any Other Special Instructions Will Be Listed Below (If Applicable).  Pharmacologic Stress Electrocardiogram A pharmacologic stress electrocardiogram is a heart (cardiac) test that uses nuclear imaging to evaluate the blood supply to your heart. This test may also be called a pharmacologic stress electrocardiography. Pharmacologic means that a medicine is used to increase your heart rate and blood pressure.  This stress test is done to find areas of poor blood flow to the heart by determining the extent of coronary artery disease (CAD). Some people exercise on a treadmill, which naturally increases the blood flow to the heart. For those people unable to exercise on a treadmill, a medicine is used. This medicine stimulates your heart and will cause your heart to beat harder and more quickly, as if you were exercising.  Pharmacologic stress tests can help determine:  The adequacy of blood flow to  your heart during increased levels of activity in order to clear you for discharge home.  The extent of coronary artery blockage caused by CAD.  Your prognosis if you have suffered a heart attack.  The effectiveness of cardiac procedures done, such as an angioplasty, which can increase the circulation in your coronary arteries.  Causes of chest pain or pressure. LET [facility] CARE PROVIDER KNOW ABOUT:  Any allergies you have.  All medicines you are taking, including vitamins, herbs, eye drops, creams, and over-the-counter medicines.  Previous problems you or members of your family have had with the use of anesthetics.  Any blood disorders you have.  Previous surgeries you have had.  Medical conditions you have.  Possibility of pregnancy, if this applies.  If you are currently breastfeeding. RISKS AND COMPLICATIONS Generally, this is a safe procedure. However, as with any procedure, complications can occur. Possible complications include:  You develop pain or pressure in the following areas:  Chest.  Jaw or neck.  Between your shoulder blades.  Radiating down your left arm.  Headache.  Dizziness or light-headedness.  Shortness of breath.  Increased or irregular heartbeat.  Low blood pressure.  Nausea or vomiting.  Flushing.  Redness going up the arm and slight pain during injection of medicine.  Heart attack (rare). BEFORE THE PROCEDURE   Avoid all forms of caffeine for 24 hours before your test or as directed by your health care provider. This includes coffee, tea (even decaffeinated tea), caffeinated sodas, chocolate, cocoa, and certain pain medicines.  Follow your health care provider's instructions regarding eating and drinking before the test.  Take your medicines as directed at regular times with water unless instructed  otherwise. Exceptions may include:  If you have diabetes, ask how you are to take your insulin or pills. It is common to adjust  insulin dosing the morning of the test.  If you are taking beta-blocker medicines, it is important to talk to your health care provider about these medicines well before the date of your test. Taking beta-blocker medicines may interfere with the test. In some cases, these medicines need to be changed or stopped 24 hours or more before the test.  If you wear a nitroglycerin patch, it may need to be removed prior to the test. Ask your health care provider if the patch should be removed before the test.  If you use an inhaler for any breathing condition, bring it with you to the test.  If you are an outpatient, bring a snack so you can eat right after the stress phase of the test.  Do not smoke for 4 hours prior to the test or as directed by your health care provider.  Do not apply lotions, powders, creams, or oils on your chest prior to the test.  Wear comfortable shoes and clothing. Let your health care provider know if you were unable to complete or follow the preparations for your test. PROCEDURE   Multiple patches (electrodes) will be put on your chest. If needed, small areas of your chest may be shaved to get better contact with the electrodes. Once the electrodes are attached to your body, multiple wires will be attached to the electrodes, and your heart rate will be monitored.  An IV access will be started. A nuclear trace (isotope) is given. The isotope may be given intravenously, or it may be swallowed. Nuclear refers to several types of radioactive isotopes, and the nuclear isotope lights up the arteries so that the nuclear images are clear. The isotope is absorbed by your body. This results in low radiation exposure.  A resting nuclear image is taken to show how your heart functions at rest.  A medicine is given through the IV access.  A second scan is done about 1 hour after the medicine injection and determines how your heart functions under stress.  During this stress phase,  you will be connected to an electrocardiogram machine. Your blood pressure and oxygen levels will be monitored. AFTER THE PROCEDURE   Your heart rate and blood pressure will be monitored after the test.  You may return to your normal schedule, including diet,activities, and medicines, unless your health care provider tells you otherwise.   This information is not intended to replace advice given to you by your health care provider. Make sure you discuss any questions you have with your health care provider.   Document Released: 08/14/2008 Document Revised: 04/02/2013 Document Reviewed: 12/03/2012 Elsevier Interactive Patient Education [employer].

## 2015-10-27 NOTE — Progress Notes (Signed)
Subjective:    Patient ID: [name], female    DOB: [birth year], 65 y.o.   MRN: [id number]  HPI 09/03/15 Patient has a past medical history of nonischemic cardiomyopathy with an ejection fraction of 40-45% in 2007. Follow-up echocardiograms have revealed improvement in her most recent echocardiogram last year revealed an ejection fraction of 50-55%. Patient had a cardiac catheterization in 2007 that showed normal coronary arteries. Last week, she reports abdominal bloating, heartburn, reflux type symptoms. She was taking frequent Alka-Seltzer's. Suddenly over the weekend, she developed severe 10 out of 10 chest pain over the left sternal border. She went to emergency room her d-dimer was slightly elevated. CT scan of the chest revealed no pulmonary embolism. Troponins were negative 3 and EKGs were normal. She was discharged home with a diagnosis of chest wall pain. The pain returned the following day and she went to an urgent care where they diagnosed her with chest wall pain and she was started on Valium and an anti-inflammatory. She continues to have pain that is reproducible at the left sternal border consistent with costochondritis. She also reports mild pleurisy. She reports some shortness of breath with activity. The pain radiates into her back. She is also continuing to have heartburn. Her blood pressure significantly elevated today.  I reviewed her EKG from 5/23 which revealed no ischemic changes. Furthermore there is no ST segment changes consistent with pericarditis.  At that time, my plan was: Most likely this represents chest wall pain and the reproducible nature at the costochondral junction is consistent with costochondritis. I recommended that she use diclofenac 75 mg by mouth twice a day with Valium 5-10 mg every 8 hours as needed for muscle pain. GI sources are also a possibility. Therefore I will start the patient on protonix 40 mg by mouth daily and consider an EGD if pain  persists. The pain does not sound like esophageal spasm. Her blood pressure is extremely high today and I will add amlodipine 10 mg by mouth daily and recheck the patient the first of next week to make sure her blood pressure is better. I will also consult cardiology given her age and numerous risk factors. She may need a stress test particularly given the shortness of breath and dyspnea on exertion with her chest pain which seems a bit extreme for costochondritis.  09/08/15 Thankfully the patient's chest pain is better. Her blood pressure today is much better. However she looks much worse. She is now reporting diffuse arthralgias in her lower back, in both of her hips and thighs.  She reports weakness. She is having a difficult time even getting up out of a chair. Her legs are not strong enough to push herself to a stand. She also reports diffuse arthralgias in all of the joints of her hands including the DIP joints, the PIP joints, and MCP joints. There is no active synovitis. There is no erythema or warmth or swelling in the joints however she has subjective pain there. Her pain is extremely widespread and she is extremely fatigued and tired. Symptoms really started in earnest approximately 1 week ago [place] in [place] at that point. They denied any tick bite. Of note she also recently started Lipitor for hyperlipidemia. Her weight is unchanged and she is afebrile.  At that time, my plan was: This appears to be some type of systemic illness. I no longer believe that the patient only is suffering from costochondritis. In  any respect, her chest wall pain has gotten better however she is now reporting diffuse myalgias and arthralgias all over her body. Given the time year, I'm concerned about tickborne illnesses like Lyme disease. I will cover her with doxycycline 100 mg by mouth twice a day for 10 days while awaiting tickborne titers.  Check a sedimentation rate, and ANA, and  rheumatoid factor to evaluate for autoimmune diseases. I'm also concerned about statin-induced myopathy. I will have the patient discontinue Lipitor and I will check a CK level.  09/18/15 No visits with results within 1 Week(s) from this visit. Latest known visit with results is:  Abstract on 09/09/2015  Component Date Value Ref Range Status  . HM Mammogram 08/13/2015 Self Reported Normal  0-4 Bi-Rad, Self Reported Normal Final   Labs returned significant only for a faintly positive ANA in a speckled pattern that disappeared after a 1-40 dilution. Patient states that she is doing much better than she was last time after stopping Lipitor and starting the doxycycline. Lab work shows no evidence of [facility] spotted fever or Lyme disease. The muscle weakness in the muscle pains particularly in her hips and thighs and legs have improved dramatically and have gone from a 8 on a scale of 10 down to a 2 or 3 on a scale of 10. Her strength is much better. Last time she was having to walk into the clinic using a walker due to weakness. She cannot even stand up out of a chair due to weakness. Now she is able to stand up without assistance. The strength in her legs is much better. Her biggest concern at this time is persistent dizziness. She still feels unsteady on her feet. She is not sure if that is due to the weakness in her legs or just plain dizziness. She has normal sensation in her legs and there is no evidence of paralysis today on her exam. She denies any vertigo. Muscle strength is 5 over 5 equal and symmetric in both legs. She has no muscle weakness in her arms. There is no obvious neurologic deficit although she does feel unsteady when she is walking. While the patient is here today I performed a diabetic foot exam. She has diminished sensation to 10 g monofilament in 2 out of the 4 locations checked primarily on the ball of the foot on the plantar surface of the first MTP joint as well as on the tips  of the great toe. She has pre-ulcerative calluses forming on the medial aspect of the first MTP joints bilaterally. She does have neuropathy.  At that time, my plan was: Patient seems to be improving. Her blood pressure today is elevated.  The last time it was perfect, so I will have the patient check her blood pressure frequently at home and recheck next week and if persistently high I will increase medication at that point. Her weakness is improving after stopping the statins. Her joint pain is improving after stopping her statin. Her muscle pains are improving after stopping her statin. Therefore I believe the positive ANA is likely a false positive. If symptoms worsen, I would consider a referral to a rheumatologist for an evaluation for lupus however I believe this is unlikely given her normal sedimentation rate. Another possibility given her costochondritis and her leg weakness and muscle pains may have been some kind of post viral syndrome along the lines of a Guillain-Barr. However the patient is much better today there is no evidence of paralysis  on her exam or numbness on her exam suspect this is highly unlikely. I will recheck her next week. She's been off the statin now for 9 days and her symptoms are much better. Hopefully in one week her symptoms be completely better. At that time we will discuss her blood sugars and her blood pressure  09/25/15 Her blood pressure today is much better. Her blood pressure at home is in keeping with a number check today. She is between 130 and 140/60-70. I believe these blood pressure values are acceptable. Her fasting blood sugars however are 170-200. She is not checking postprandial sugars. She admits that she's not taking her Lantus regularly. On days that she takes it she takes 45 units twice a day. However she will frequently skip days because she cannot afford the Lantus. Now that she is in the donut hole it's going to cost her $700. She is also not taking the  glyxambi regularly because it is causing her just as much. Therefore her sugars fluctuate wildly depending on whether she has or hasn't taken her medication. The joint pains and muscle pains have stopped. However she continues to complain of severe dizziness. She is having trouble with a cane due to her disequilibrium. 6 months ago she did not have to walk with a cane. This is not improving. She denies any vertigo. She denies any unilateral muscle weakness slurred speech, or facial droop but the dizziness is certainly abnormal for this patient. Given her history of hypertension and uncontrolled diabetes she is certainly at higher risk for stroke and possibly this could cause her dizziness if it was in the posterior circulation.  At that time, my plan was:  I'm willing to except the blood pressure. However her sugars are not well controlled nor will they be on the current regimen and she cannot afford that. Therefore we are going to discontinue lantus and glyxambi and replace with novolin 70/30 60 units in the morning and 30 units with supper and recheck fasting blood sugars and two-hour postprandial sugars in one week so that we can titrate further. I'm going to schedule the patient for an MRI of the brain to rule out a stroke in the posterior circulation which could possibly explain her abnormal coordination and dizziness.  10/27/15 MRI revealed chronic microvascular changes consistent with age but no acute abnormality and no stroke in the posterior circulation. However they did see effusion in the left mastoid. She was placed on Omnicef 300 mg by mouth twice a day by my partner in my absence. Patient has not seen any benefit regarding her dizziness. She continues to have dizziness upon standing. Some of the dizziness sounds even vertigo-like in nature. She is also now starting to report a constant hissing sound in her left ear which sounds like she may be experiencing tinnitus. She does have underlying hearing  loss in that ear which is chronic. This raises the concern for Mnire's disease.  Past Medical History  Diagnosis Date  . GERD (gastroesophageal reflux disease)   . Hypothyroidism   . Peripheral neuropathy (HCC)   . Cardiomyopathy   . Chest pain 06/2005    Hospitalized, dystolic dysfunction  . Sciatica   . Type II diabetes mellitus (HCC)   . PONV (postoperative nausea and vomiting)   . H/O hiatal hernia   . Migraines     "it's been a long time since I've had one" (03/20/2012)  . Osteoarthritis   . Arthritis     "all over;  mostly in my back" (03/20/2012)  . Chronic lower back pain     "have 2 herniated discs; going to have to have a fusion" (03/20/2012)  . Breast cancer ([place])     "right" (03/20/2012)  . Family history of anesthesia complication     "father has nausea and vomiting"  . Irritable bowel syndrome   . Hypertension     "patient states never had HTN, takes Hyzaar for heart  . History of right mastectomy   . Anginal pain ([place]) 03/19/2012    saw Dr. Cathie Olden .. she thinks its more related to stomach issues  . History of kidney stones    Past Surgical History  Procedure Laterality Date  . Abdominal hysterectomy  1993  . Total knee arthroplasty  2000    Right  . Mastectomy with axillary lymph node dissection  12/2003    "right" (03/20/2012)  . Thyroidectomy  1977    Right  . Posterior cervical fusion/foraminotomy  2001  . Shoulder arthroscopy w/ rotator cuff repair  2003    Left  . Cardiac catheterization  ? 2005 / 2007  . Adenosin cardiolite  07/2005    (+) wall motion abnormality  . Esophagogastroduodenoscopy  2005    GERD  . [place] of abdomen  07/2005    Fatty liver / kidney stones  . Ct abdomen and pelvis  02/2010    Same  . Knee arthroscopy  1990's    "right" (03/20/2012)  . Axillary lymph node dissection  2003    squamous cell cancer  . Back surgery    . Lumbar laminectomy/decompression microdiscectomy N/A 02/04/2014    Procedure: RIGHT L2-3  MICRODISCECTOMY ;  Surgeon: Jessy Oto, MD;  Location: [facility];  Service: Orthopedics;  Laterality: N/A;  . Lumbar fusion N/A 08/22/2014    Procedure: Right L2-3 and right L1-2 transforaminal lumbar interbody fusion with pedicle screws, rods, sleeves, cages, local bone graft, Vivigen, cancellous chips;  Surgeon: Jessy Oto, MD;  Location: [facility];  Service: Orthopedics;  Laterality: N/A;   Current Outpatient Prescriptions on File Prior to Visit  Medication Sig Dispense Refill  . acetaminophen (TYLENOL) 325 MG tablet Take 650 mg by mouth every 6 (six) hours as needed.    [name] Kitchen aspirin 81 MG tablet Take 81 mg by mouth daily.    . Blood Glucose Calibration (TRUE METRIX LEVEL 2) Normal SOLN Use as directed. 3 each 3  . Blood Glucose Monitoring Suppl (BLOOD GLUCOSE SYSTEM PAK) KIT Use as directed to monitor FSBS 3x daily. Dx: E11.9. Please dispense [employer] True Metrix. 1 each 1  . cefdinir (OMNICEF) 300 MG capsule Take 1 capsule (300 mg total) by mouth 2 (two) times daily. 14 capsule 0  . Cholecalciferol (VITAMIN D3) 5000 UNITS CAPS Take 5,000 Units by mouth 2 (two) times daily.    . diazepam (VALIUM) 10 MG tablet Take 1 tablet (10 mg total) by mouth every 8 (eight) hours as needed (for muscle pain). 30 tablet 1  . fluticasone (FLONASE) 50 MCG/ACT nasal spray Place 2 sprays into both nostrils daily. 48 g 3  . Glucose Blood (BLOOD GLUCOSE TEST STRIPS) STRP Use as directed to monitor FSBS 3x daily. Dx: E11.9. Please dispense [employer] True Metrix. 300 each 3  . GLYXAMBI 25-5 MG TABS Take 1 tablet by mouth daily.    . insulin NPH-regular Human (NOVOLIN 70/30) (70-30) 100 UNIT/ML injection Inject 60 units with breakfast, Inject 30 units with dinner 30 mL 11  . Insulin Pen  Needle (PEN NEEDLES 5/16") 30G X 8 MM MISC Use to inject insulin 5x daily. 200 each 11  . Lancet Devices MISC Use as directed to monitor FSBS 3x daily. Dx: E11.9. Please dispense TruePlus Ultra Thin. 1 each 1  . Lancets MISC Use as directed to  monitor FSBS 3x daily. Dx: E11.9. Please dispense TruePlus Ultra Thin. 300 each 3  . levothyroxine (SYNTHROID, LEVOTHROID) 100 MCG tablet TAKE 1 TABLET EVERY DAY 30 tablet 1  . losartan-hydrochlorothiazide (HYZAAR) 50-12.5 MG tablet TAKE 1 TABLET EVERY DAY 30 tablet 1  . meloxicam (MOBIC) 15 MG tablet 1 TABLET DAILY AS NEEDED 90 tablet 4  . metFORMIN (GLUCOPHAGE) 850 MG tablet Take 1 tablet (850 mg total) by mouth 2 (two) times daily with a meal. 180 tablet 3  . Misc Natural Products (LAXATIVE FORMULA) TABS Take 1 tablet by mouth as needed (for BM).    . ondansetron (ZOFRAN ODT) 4 MG disintegrating tablet Take 1 tablet (4 mg total) by mouth every 8 (eight) hours as needed for nausea or vomiting. 20 tablet 0  . ondansetron (ZOFRAN) 4 MG tablet Take 1 tablet (4 mg total) by mouth every 6 (six) hours. 36 tablet 3  . oxyCODONE-acetaminophen (PERCOCET/ROXICET) 5-325 MG tablet 1 tablet every 6 (six) hours as needed for moderate pain.     . pantoprazole (PROTONIX) 40 MG tablet Take 1 tablet (40 mg total) by mouth daily. 30 tablet 3  . pregabalin (LYRICA) 150 MG capsule Take 1 capsule (150 mg total) by mouth 2 (two) times daily. 60 capsule 2  . tamsulosin (FLOMAX) 0.4 MG CAPS capsule Take 1 capsule (0.4 mg total) by mouth daily. 30 capsule 0   No current facility-administered medications on file prior to visit.   Allergies  Allergen Reactions  . Dilaudid [Hydromorphone Hcl] Nausea And Vomiting  . Lipitor [Atorvastatin] Other (See Comments)    Body & Muscle Aches  . Adhesive [Tape] Rash  . Ampicillin Rash    REACTION: Rash and welts on her hands when she took it in the hospital  . Penicillins Rash   Social History   Social History  . Marital Status: Married    Spouse Name: N/A  . Number of Children: 1  . Years of Education: N/A   Occupational History  . Retired since 2003    Social History Main Topics  . Smoking status: Never Smoker   . Smokeless tobacco: Never Used  . Alcohol Use: No    . Drug Use: No  . Sexual Activity: Not on file   Other Topics Concern  . Not on file   Social History Narrative      Review of Systems  All other systems reviewed and are negative.      Objective:   Physical Exam  Constitutional: She is oriented to person, place, and time. She appears well-developed and well-nourished. No distress.  Neck: No JVD present.  Cardiovascular: Normal rate, regular rhythm and normal heart sounds.   Pulmonary/Chest: Effort normal. She has no wheezes. She has no rales. She exhibits tenderness.  Abdominal: Soft. Bowel sounds are normal. She exhibits no distension. There is no tenderness. There is no rebound and no guarding.  Musculoskeletal: She exhibits no edema.  Neurological: She is alert and oriented to person, place, and time. She has normal reflexes. No cranial nerve deficit. She exhibits normal muscle tone. Coordination abnormal.  Skin: She is not diaphoretic.  Vitals reviewed.         Assessment & Plan:  Severe dizziness - Plan: Ambulatory referral to ENT  Tinnitus, left - Plan: Ambulatory referral to ENT There is no evidence of an ear infection on her exam. Tympanic membranes are normal in appearance. There is no middle ear effusion I can appreciate on her exam. There is no tenderness to palpation over the mastoid process. I do not believe that the fluid seen on the MRI as a reflection of any underlying infectious process. Furthermore I do not think further antibiotics would be beneficial to this patient. Given the vertigo, the tinnitus, and hearing loss, I will consult ENT regarding possible Mnire's disease.

## 2015-10-29 ENCOUNTER — Encounter (INDEPENDENT_AMBULATORY_CARE_PROVIDER_SITE_OTHER): Payer: Self-pay

## 2015-10-29 ENCOUNTER — Ambulatory Visit (HOSPITAL_COMMUNITY): Payer: Commercial Managed Care - HMO | Attending: Cardiology

## 2015-10-29 DIAGNOSIS — I10 Essential (primary) hypertension: Secondary | ICD-10-CM | POA: Diagnosis not present

## 2015-10-29 DIAGNOSIS — R079 Chest pain, unspecified: Secondary | ICD-10-CM | POA: Diagnosis not present

## 2015-10-29 DIAGNOSIS — E109 Type 1 diabetes mellitus without complications: Secondary | ICD-10-CM | POA: Insufficient documentation

## 2015-10-29 DIAGNOSIS — R9439 Abnormal result of other cardiovascular function study: Secondary | ICD-10-CM | POA: Insufficient documentation

## 2015-10-29 LAB — MYOCARDIAL PERFUSION IMAGING
CHL CUP NUCLEAR SDS: 8
CHL CUP NUCLEAR SRS: 7
CHL CUP NUCLEAR SSS: 15
LV dias vol: 113 mL (ref 46–106)
LV sys vol: 48 mL
Peak HR: 87 {beats}/min
RATE: 0.28
Rest HR: 61 {beats}/min
TID: 0.89

## 2015-10-29 MED ORDER — TECHNETIUM TC 99M TETROFOSMIN IV KIT
11.0000 | PACK | Freq: Once | INTRAVENOUS | Status: AC | PRN
  Administered 2015-10-29: 11 via INTRAVENOUS
  Filled 2015-10-29: qty 11

## 2015-10-29 MED ORDER — TECHNETIUM TC 99M TETROFOSMIN IV KIT
33.0000 | PACK | Freq: Once | INTRAVENOUS | Status: AC | PRN
  Administered 2015-10-29: 33 via INTRAVENOUS
  Filled 2015-10-29: qty 33

## 2015-10-29 MED ORDER — AMINOPHYLLINE 25 MG/ML IV SOLN
225.0000 mg | Freq: Once | INTRAVENOUS | Status: AC
  Administered 2015-10-29: 225 mg via INTRAVENOUS

## 2015-10-29 MED ORDER — REGADENOSON 0.4 MG/5ML IV SOLN
0.4000 mg | Freq: Once | INTRAVENOUS | Status: AC
  Administered 2015-10-29: 0.4 mg via INTRAVENOUS

## 2015-11-04 DIAGNOSIS — M461 Sacroiliitis, not elsewhere classified: Secondary | ICD-10-CM | POA: Diagnosis not present

## 2015-11-04 DIAGNOSIS — M545 Low back pain: Secondary | ICD-10-CM | POA: Diagnosis not present

## 2015-11-12 ENCOUNTER — Telehealth: Payer: Self-pay | Admitting: Family Medicine

## 2015-11-12 NOTE — Telephone Encounter (Signed)
[facility] referral for ENT Dr Benjamine Mola for 6 visits 11/01/15 - 04/29/16 Dx  H93.12  Auth # [id number]

## 2015-11-19 DIAGNOSIS — M461 Sacroiliitis, not elsewhere classified: Secondary | ICD-10-CM | POA: Diagnosis not present

## 2015-11-26 ENCOUNTER — Ambulatory Visit: Payer: Commercial Managed Care - HMO | Admitting: Podiatry

## 2015-12-10 ENCOUNTER — Ambulatory Visit: Payer: Commercial Managed Care - HMO | Admitting: Family Medicine

## 2015-12-16 ENCOUNTER — Encounter (HOSPITAL_BASED_OUTPATIENT_CLINIC_OR_DEPARTMENT_OTHER): Payer: Commercial Managed Care - HMO

## 2015-12-29 DIAGNOSIS — H9209 Otalgia, unspecified ear: Secondary | ICD-10-CM | POA: Diagnosis not present

## 2015-12-29 DIAGNOSIS — R42 Dizziness and giddiness: Secondary | ICD-10-CM | POA: Diagnosis not present

## 2015-12-29 DIAGNOSIS — H903 Sensorineural hearing loss, bilateral: Secondary | ICD-10-CM | POA: Diagnosis not present

## 2015-12-31 DIAGNOSIS — M4812 Ankylosing hyperostosis [Forestier], cervical region: Secondary | ICD-10-CM | POA: Diagnosis not present

## 2015-12-31 DIAGNOSIS — M5412 Radiculopathy, cervical region: Secondary | ICD-10-CM | POA: Diagnosis not present

## 2015-12-31 DIAGNOSIS — M4322 Fusion of spine, cervical region: Secondary | ICD-10-CM | POA: Diagnosis not present

## 2016-01-01 ENCOUNTER — Telehealth: Payer: Self-pay | Admitting: Family Medicine

## 2016-01-01 NOTE — Telephone Encounter (Signed)
Received fax from [facility] back Auth# [id number] Treating Provider Dr Druscilla Brownie # of Visits 6 Start Date 01/01/2016 End Date 06/29/2016 CPT 99499 DX B95.62

## 2016-01-01 NOTE — Telephone Encounter (Signed)
Patient is calling to get a referral to [facility] asap for mrsa, she has seen dr pickard for this before

## 2016-01-01 NOTE — Telephone Encounter (Signed)
Tried to call pt on home number no answer and no vm, left message on cell number about referral and to call Dr. Allyson Sabal to get an appt today or Monday.

## 2016-01-02 ENCOUNTER — Emergency Department (HOSPITAL_BASED_OUTPATIENT_CLINIC_OR_DEPARTMENT_OTHER)
Admission: EM | Admit: 2016-01-02 | Discharge: 2016-01-02 | Disposition: A | Discharge status: Home / Self Care | Payer: Commercial Managed Care - HMO | Attending: Emergency Medicine | Admitting: Emergency Medicine

## 2016-01-02 ENCOUNTER — Encounter (HOSPITAL_BASED_OUTPATIENT_CLINIC_OR_DEPARTMENT_OTHER): Payer: Self-pay | Admitting: Emergency Medicine

## 2016-01-02 DIAGNOSIS — Z7984 Long term (current) use of oral hypoglycemic drugs: Secondary | ICD-10-CM | POA: Insufficient documentation

## 2016-01-02 DIAGNOSIS — I5032 Chronic diastolic (congestive) heart failure: Secondary | ICD-10-CM | POA: Insufficient documentation

## 2016-01-02 DIAGNOSIS — L089 Local infection of the skin and subcutaneous tissue, unspecified: Secondary | ICD-10-CM

## 2016-01-02 DIAGNOSIS — L98491 Non-pressure chronic ulcer of skin of other sites limited to breakdown of skin: Secondary | ICD-10-CM

## 2016-01-02 DIAGNOSIS — I110 Hypertensive heart disease with heart failure: Secondary | ICD-10-CM | POA: Insufficient documentation

## 2016-01-02 DIAGNOSIS — Z794 Long term (current) use of insulin: Secondary | ICD-10-CM | POA: Insufficient documentation

## 2016-01-02 DIAGNOSIS — L98499 Non-pressure chronic ulcer of skin of other sites with unspecified severity: Secondary | ICD-10-CM | POA: Diagnosis not present

## 2016-01-02 DIAGNOSIS — E039 Hypothyroidism, unspecified: Secondary | ICD-10-CM | POA: Diagnosis not present

## 2016-01-02 DIAGNOSIS — Z79899 Other long term (current) drug therapy: Secondary | ICD-10-CM | POA: Insufficient documentation

## 2016-01-02 DIAGNOSIS — T148XXA Other injury of unspecified body region, initial encounter: Secondary | ICD-10-CM

## 2016-01-02 DIAGNOSIS — E119 Type 2 diabetes mellitus without complications: Secondary | ICD-10-CM | POA: Diagnosis not present

## 2016-01-02 DIAGNOSIS — Z7982 Long term (current) use of aspirin: Secondary | ICD-10-CM | POA: Insufficient documentation

## 2016-01-02 DIAGNOSIS — Z853 Personal history of malignant neoplasm of breast: Secondary | ICD-10-CM | POA: Diagnosis not present

## 2016-01-02 MED ORDER — SULFAMETHOXAZOLE-TRIMETHOPRIM 800-160 MG PO TABS: 1.0000 | ORAL_TABLET | Freq: Two times a day (BID) | ORAL | 0 refills | Status: AC

## 2016-01-02 NOTE — Telephone Encounter (Signed)
Referral completed by Larene Beach - no return call from pt.

## 2016-01-02 NOTE — ED Triage Notes (Addendum)
Pt has 3 red wounds under R arm that have been there since Tuesday. Pt states she has had these before and tx with abx. Pt is diabetic and BS 233 at home this morning.

## 2016-01-02 NOTE — ED Provider Notes (Signed)
[facility] DEPT [facility] Provider Note   CSN: [id number] Arrival date & time: 01/02/16  1144     History   Chief Complaint Chief Complaint  Patient presents with  . Wound Infection    HPI [name] is a 65 y.o. female.  HPI Patient has had 3 erosive-type lesions develop on her right upper body. One is on the underside of her right arm which lies adjacent to the 2 that are on her chest wall. They are only slightly painful. There is some surrounding redness. Patient has had this happen to other times before. They have improved and resolved with antibiotics in the past. She has not had fever or chills. He reports due to having been put on steroids more recently she does have an elevation in her blood sugar and they've been running in the mid 200s. Past Medical History:  Diagnosis Date  . Anginal pain ([place]) 03/19/2012   saw Dr. Cathie Olden .. she thinks its more related to stomach issues  . Arthritis    "all over; mostly in my back" (03/20/2012)  . Breast cancer ([place])    "right" (03/20/2012)  . Cardiomyopathy   . Chest pain 06/2005   Hospitalized, dystolic dysfunction  . Chronic lower back pain    "have 2 herniated discs; going to have to have a fusion" (03/20/2012)  . Family history of anesthesia complication    "father has nausea and vomiting"  . GERD (gastroesophageal reflux disease)   . H/O hiatal hernia   . History of kidney stones   . History of right mastectomy   . Hypertension    "patient states never had HTN, takes Hyzaar for heart  . Hypothyroidism   . Irritable bowel syndrome   . Migraines    "it's been a long time since I've had one" (03/20/2012)  . Osteoarthritis   . Peripheral neuropathy ([place])   . PONV (postoperative nausea and vomiting)   . Sciatica   . Type II diabetes mellitus [facility])     Patient Active Problem List   Diagnosis Date Noted  . Chronic constipation 06/23/2015  . Obesity 06/12/2015  . Chronic diastolic heart failure ([place]) 11/17/2014    . HNP (herniated nucleus pulposus), lumbar 08/22/2014    Class: Chronic  . Herniated nucleus pulposus, L2-3 right 02/04/2014    Class: Acute  . Urinary retention 05/04/2012    Class: Temporary  . Spondylolisthesis of lumbar region 04/30/2012    Class: Chronic  . Lumbar stenosis with neurogenic claudication 04/30/2012    Class: Chronic  . Chest pain 03/20/2012  . Right shoulder pain 12/07/2010  . ABDOMINAL PAIN, LOWER 09/21/2009  . BACK PAIN 03/18/2009  . SYNCOPE-CAROTID SINUS 09/16/2008  . Essential hypertension 08/05/2008  . Hypothyroidism 08/23/2006  . Diabetes with neurologic complications ([place]) 35/00/9381  . Diabetic neuropathy ([place]) 08/23/2006  . CARDIOMYOPATHY 08/23/2006  . GERD 08/23/2006  . FATTY LIVER DISEASE 08/23/2006  . OSTEOARTHRITIS 08/23/2006  . [place] DISEASE, CERVICAL 08/23/2006  . [employer] DISEASE, LUMBAR 08/23/2006  . Headache(784.0) 08/23/2006  . BREAST CANCER, HX OF 08/23/2006  . HYPERPARATHYROIDISM, HX OF 08/23/2006  . RENAL CALCULUS, HX OF 08/23/2006    Past Surgical History:  Procedure Laterality Date  . ABDOMINAL HYSTERECTOMY  1993  . adenosin cardiolite  07/2005   (+) wall motion abnormality  . AXILLARY LYMPH NODE DISSECTION  2003   squamous cell cancer  . BACK SURGERY    . CARDIAC CATHETERIZATION  ? 2005 / 2007  . CT abdomen and  pelvis  02/2010   Same  . ESOPHAGOGASTRODUODENOSCOPY  2005   GERD  . KNEE ARTHROSCOPY  1990's   "right" (03/20/2012)  . LUMBAR FUSION N/A 08/22/2014   Procedure: Right L2-3 and right L1-2 transforaminal lumbar interbody fusion with pedicle screws, rods, sleeves, cages, local bone graft, Vivigen, cancellous chips;  Surgeon: Jessy Oto, MD;  Location: [facility];  Service: Orthopedics;  Laterality: N/A;  . LUMBAR LAMINECTOMY/DECOMPRESSION MICRODISCECTOMY N/A 02/04/2014   Procedure: RIGHT L2-3 MICRODISCECTOMY ;  Surgeon: Jessy Oto, MD;  Location: [facility];  Service: Orthopedics;  Laterality: N/A;  . MASTECTOMY WITH AXILLARY  LYMPH NODE DISSECTION  12/2003   "right" (03/20/2012)  . POSTERIOR CERVICAL FUSION/FORAMINOTOMY  2001  . SHOULDER ARTHROSCOPY W/ ROTATOR CUFF REPAIR  2003   Left  . THYROIDECTOMY  1977   Right  . TOTAL KNEE ARTHROPLASTY  2000   Right  . [place] of abdomen  07/2005   Fatty liver / kidney stones    OB History    No data available       Home Medications    Prior to Admission medications   Medication Sig Start Date End Date Taking? Authorizing Provider  acetaminophen (TYLENOL) 325 MG tablet Take 650 mg by mouth every 6 (six) hours as needed.    Historical Provider, MD  aspirin 81 MG tablet Take 81 mg by mouth daily.    Historical Provider, MD  Blood Glucose Calibration (TRUE METRIX LEVEL 2) Normal SOLN Use as directed. 08/20/15   Susy Frizzle, MD  Blood Glucose Monitoring Suppl (BLOOD GLUCOSE SYSTEM PAK) KIT Use as directed to monitor FSBS 3x daily. Dx: E11.9. Please dispense [employer] True Metrix. 08/20/15   Susy Frizzle, MD  cefdinir (OMNICEF) 300 MG capsule Take 1 capsule (300 mg total) by mouth 2 (two) times daily. 10/08/15   Susy Frizzle, MD  Cholecalciferol (VITAMIN D3) 5000 UNITS CAPS Take 5,000 Units by mouth 2 (two) times daily.    Historical Provider, MD  diazepam (VALIUM) 10 MG tablet Take 1 tablet (10 mg total) by mouth every 8 (eight) hours as needed (for muscle pain). 09/03/15   Susy Frizzle, MD  fluticasone (FLONASE) 50 MCG/ACT nasal spray Place 2 sprays into both nostrils daily. 08/20/15   Susy Frizzle, MD  Glucose Blood (BLOOD GLUCOSE TEST STRIPS) STRP Use as directed to monitor FSBS 3x daily. Dx: E11.9. Please dispense [employer] True Metrix. 08/20/15   Susy Frizzle, MD  GLYXAMBI 25-5 MG TABS Take 1 tablet by mouth daily. 08/27/15   Historical Provider, MD  insulin NPH-regular Human (NOVOLIN 70/30) (70-30) 100 UNIT/ML injection Inject 60 units with breakfast, Inject 30 units with dinner 09/25/15   Susy Frizzle, MD  Insulin Pen Needle (PEN NEEDLES 5/16") 30G X 8  MM MISC Use to inject insulin 5x daily. 12/31/13   Susy Frizzle, MD  Lancet Devices MISC Use as directed to monitor FSBS 3x daily. Dx: E11.9. Please dispense TruePlus Ultra Thin. 08/20/15   Susy Frizzle, MD  Lancets MISC Use as directed to monitor FSBS 3x daily. Dx: E11.9. Please dispense TruePlus Ultra Thin. 08/20/15   Susy Frizzle, MD  levothyroxine (SYNTHROID, LEVOTHROID) 100 MCG tablet TAKE 1 TABLET EVERY DAY 09/02/15   Susy Frizzle, MD  losartan-hydrochlorothiazide [facility]) 50-12.5 MG tablet TAKE 1 TABLET EVERY DAY 09/02/15   Susy Frizzle, MD  meloxicam (MOBIC) 15 MG tablet 1 TABLET DAILY AS NEEDED 08/20/15   Susy Frizzle, MD  metFORMIN (GLUCOPHAGE) 850 MG tablet Take 1 tablet (850 mg total) by mouth 2 (two) times daily with a meal. 08/20/15   Susy Frizzle, MD  Misc Natural Products (LAXATIVE FORMULA) TABS Take 1 tablet by mouth as needed (for BM).    Historical Provider, MD  ondansetron (ZOFRAN ODT) 4 MG disintegrating tablet Take 1 tablet (4 mg total) by mouth every 8 (eight) hours as needed for nausea or vomiting. 08/30/15   Kristen N Ward, DO  ondansetron (ZOFRAN) 4 MG tablet Take 1 tablet (4 mg total) by mouth every 6 (six) hours. 08/20/15   Susy Frizzle, MD  oxyCODONE-acetaminophen (PERCOCET/ROXICET) 5-325 MG tablet 1 tablet every 6 (six) hours as needed for moderate pain.  08/30/15   Historical Provider, MD  pantoprazole (PROTONIX) 40 MG tablet Take 1 tablet (40 mg total) by mouth daily. 09/03/15   Susy Frizzle, MD  pregabalin (LYRICA) 150 MG capsule Take 1 capsule (150 mg total) by mouth 2 (two) times daily. 07/21/15   Alycia Rossetti, MD  sulfamethoxazole-trimethoprim (BACTRIM DS,SEPTRA DS) 800-160 MG tablet Take 1 tablet by mouth 2 (two) times daily. 01/02/16 01/09/16  Charlesetta Shanks, MD  tamsulosin (FLOMAX) 0.4 MG CAPS capsule Take 1 capsule (0.4 mg total) by mouth daily. 06/23/15   Alycia Rossetti, MD    Family History Family History  Problem Relation Age of  Onset  . Hypertension Mother   . Diabetes Father   . Hypertension Father   . Hypertension Sister   . Hypertension Brother   . Hypertension Brother   . Diabetes Brother   . Diabetes Other     Family Hx. of Diabetes  . Hypertension Other     Family History of HTN    Social History Social History  Substance Use Topics  . Smoking status: Never Smoker  . Smokeless tobacco: Never Used  . Alcohol use No     Allergies   Dilaudid [hydromorphone hcl]; Lipitor [atorvastatin]; Adhesive [tape]; Ampicillin; and Penicillins   Review of Systems Review of Systems 10 Systems reviewed and are negative for acute change except as noted in the HPI.   Physical Exam Updated Vital Signs BP 157/77 (BP Location: Left Arm)   Pulse 61   Temp 97.6 F (36.4 C) (Oral)   Resp 18   Ht 5' 4.5" (1.638 m)   Wt 197 lb (89.4 kg)   SpO2 97%   BMI 33.29 kg/m   Physical Exam  Constitutional: She is oriented to person, place, and time.  Patient is alert and nontoxic. She is in no acute distress. Patient is morbidly obese.  HENT:  Head: Normocephalic and atraumatic.  Eyes: Right eye exhibits no discharge. Left eye exhibits no discharge.  Cardiovascular: Normal rate, regular rhythm and intact distal pulses.   Pulmonary/Chest: Effort normal and breath sounds normal.  Patient has had prior mastectomy of the right chest wall. She has multiple well-healed scars. The patient does have a looser skin flap that has 2 oval ulcerations on it. One of these ulcerations is approximately 1 cm and has a yellow, fibrinous base. There is approximate half a centimeter surrounding erythema. The other ulceration is also proximally 1 cm and has a dryer dark eschar base. Minimal surrounding erythema. The tissue fold itself does not have paniculitis or diffuse cellulitis. Patient has an opposing lesion on the medial aspect of her upper arm. This lesion is approximately 1-1/2 cm oval with again an eroded, fibrinous base.  Surrounding this is approximately 1 cm  rim of erythema with some induration. Slightly more tender. No abscess, fluctuance or active drainage.  Abdominal: Soft.  Musculoskeletal: Normal range of motion.  Patient has normal range of motion of her right upper extremity. No difficulty elevating her arm and shoulder.  Neurological: She is alert and oriented to person, place, and time. She exhibits normal muscle tone.  Skin: Skin is warm and dry.  Psychiatric: She has a normal mood and affect.     ED Treatments / Results  Labs (all labs ordered are listed, but only abnormal results are displayed) Labs Reviewed - No data to display  EKG  EKG Interpretation None       Radiology No results found.  Procedures Procedures (including critical care time)  Medications Ordered in ED Medications - No data to display   Initial Impression / Assessment and Plan / ED Course  I have reviewed the triage vital signs and the nursing notes.  Pertinent labs & imaging results that were available during my care of the patient were reviewed by me and considered in my medical decision making (see chart for details).  Clinical Course    Final Clinical Impressions(s) / ED Diagnoses   Final diagnoses:  Wound infection ([place])  Skin ulcer, limited to breakdown of skin ([place])   The patient has had 2 similar episodes. She reports that they have improved with treatment with antibiotics. She has no associated constitutional symptoms right now. Findings are limited without diffuse cellulitis or associated abscess or panniculitis. Patient was seen by dermatology for this once before, she reports however by the time she got to see the dermatologist it had improved and was nearly gone, so didn't do any further testing. She reports she was told to come back and see them if it came back again. She tried to go to the dermatologist but she states she was told that she couldn't be seen that day and needed a referral from  her family doctor. Patient be treated with Bactrim for suspected MRSA-type skin lesions. At this time, she is clinically well without signs of bacteremia or diffuse infectious process. New Prescriptions New Prescriptions   SULFAMETHOXAZOLE-TRIMETHOPRIM (BACTRIM DS,SEPTRA DS) 800-160 MG TABLET    Take 1 tablet by mouth 2 (two) times daily.     Charlesetta Shanks, MD 01/02/16 1500

## 2016-01-05 ENCOUNTER — Telehealth: Payer: Self-pay | Admitting: Family Medicine

## 2016-01-05 ENCOUNTER — Other Ambulatory Visit (INDEPENDENT_AMBULATORY_CARE_PROVIDER_SITE_OTHER): Payer: Self-pay | Admitting: Specialist

## 2016-01-05 DIAGNOSIS — M542 Cervicalgia: Secondary | ICD-10-CM

## 2016-01-05 NOTE — Telephone Encounter (Signed)
Called patient on her cell she is aware of this appt.

## 2016-01-05 NOTE — Telephone Encounter (Signed)
Patient called stating she needed a referral to dermatologist. I see a previous phone note where we started the referral through [facility]. Patient states they won't let her make an appointment that we needed to make the appt I have made her one for Thursday 01/07/16 at 9:10 with [place] Black who she has seen in the past.  I have tried calling patient to inform her of the appt however no answer.

## 2016-01-07 DIAGNOSIS — L98499 Non-pressure chronic ulcer of skin of other sites with unspecified severity: Secondary | ICD-10-CM | POA: Diagnosis not present

## 2016-01-07 DIAGNOSIS — L309 Dermatitis, unspecified: Secondary | ICD-10-CM | POA: Diagnosis not present

## 2016-01-11 ENCOUNTER — Ambulatory Visit (INDEPENDENT_AMBULATORY_CARE_PROVIDER_SITE_OTHER): Payer: Self-pay | Admitting: Specialist

## 2016-01-21 ENCOUNTER — Other Ambulatory Visit: Payer: Commercial Managed Care - HMO

## 2016-02-01 ENCOUNTER — Ambulatory Visit
Admission: RE | Admit: 2016-02-01 | Discharge: 2016-02-01 | Disposition: A | Discharge status: Home / Self Care | Payer: Commercial Managed Care - HMO | Source: Ambulatory Visit | Attending: Specialist | Admitting: Specialist

## 2016-02-01 DIAGNOSIS — H2513 Age-related nuclear cataract, bilateral: Secondary | ICD-10-CM | POA: Diagnosis not present

## 2016-02-01 DIAGNOSIS — H524 Presbyopia: Secondary | ICD-10-CM | POA: Diagnosis not present

## 2016-02-01 DIAGNOSIS — M542 Cervicalgia: Secondary | ICD-10-CM

## 2016-02-01 DIAGNOSIS — M47812 Spondylosis without myelopathy or radiculopathy, cervical region: Secondary | ICD-10-CM | POA: Diagnosis not present

## 2016-02-01 MED ORDER — GADOBENATE DIMEGLUMINE 529 MG/ML IV SOLN
20.0000 mL | Freq: Once | INTRAVENOUS | Status: AC | PRN
  Administered 2016-02-01: 20 mL via INTRAVENOUS

## 2016-02-02 ENCOUNTER — Telehealth: Payer: Self-pay | Admitting: Family Medicine

## 2016-02-02 NOTE — Telephone Encounter (Signed)
Pt needs follow up eye appt for diabetic eye care.  Craig Staggers # [id number] for 6 visits with Dr Karl [place], [place]. from 02/01/16 - 07/30/16  Dx E11.43

## 2016-02-05 ENCOUNTER — Encounter (INDEPENDENT_AMBULATORY_CARE_PROVIDER_SITE_OTHER): Payer: Self-pay | Admitting: Specialist

## 2016-02-05 ENCOUNTER — Ambulatory Visit (INDEPENDENT_AMBULATORY_CARE_PROVIDER_SITE_OTHER): Payer: Commercial Managed Care - HMO | Admitting: Specialist

## 2016-02-05 VITALS — BP 161/89 | HR 80 | Ht 65.0 in | Wt 190.0 lb

## 2016-02-05 DIAGNOSIS — M4722 Other spondylosis with radiculopathy, cervical region: Secondary | ICD-10-CM | POA: Diagnosis not present

## 2016-02-05 DIAGNOSIS — M961 Postlaminectomy syndrome, not elsewhere classified: Secondary | ICD-10-CM

## 2016-02-05 DIAGNOSIS — M50021 Cervical disc disorder at C4-C5 level with myelopathy: Secondary | ICD-10-CM

## 2016-02-05 DIAGNOSIS — M5417 Radiculopathy, lumbosacral region: Secondary | ICD-10-CM | POA: Diagnosis not present

## 2016-02-05 DIAGNOSIS — M5416 Radiculopathy, lumbar region: Secondary | ICD-10-CM

## 2016-02-05 MED ORDER — TRAMADOL HCL 50 MG PO TABS: 50.0000 mg | ORAL_TABLET | Freq: Four times a day (QID) | ORAL | 1 refills | Status: DC | PRN

## 2016-02-05 MED ORDER — BACLOFEN 10 MG PO TABS: 10.0000 mg | ORAL_TABLET | Freq: Two times a day (BID) | ORAL | 0 refills | Status: DC

## 2016-02-05 NOTE — Patient Instructions (Signed)
Avoid over head use of arms and overhead lifting. No lifting more than 10-15 lbs. You are totally disability. May try part time sedentary work. Ice or moist heat is okay for pain. Try tramadol for pain. Try baclofen for muscle spasm Obtain the EMG/NCS to rule out nerve problem outside of the neck Return appt in 4 weeks. Go to PT for coordination and balance and LE strengthening.

## 2016-02-05 NOTE — Progress Notes (Signed)
Office Visit Note   [name]           Date of Birth: Jul 19, 1950           MRN: [id number] Visit Date: 02/05/2016              Requested by: Susy Frizzle, MD [address] [facility] [place], [place] [address] PCP: Odette Fraction, MD   Assessment & Plan: Visit Diagnoses:  1. Other spondylosis with radiculopathy, cervical region   2. Cervical disc disorder at C4-C5 level with myelopathy   3. Lumbar post-laminectomy syndrome   4. Lumbar back pain with radiculopathy affecting right lower extremity     Plan: Avoid over head use of arms and overhead lifting. No lifting more than 10-15 lbs. You are totally disability. May try part time sedentary work. Ice or moist heat is okay for pain. Try tramadol for pain. Try baclofen for muscle spasm Obtain the EMG/NCS to rule out nerve problem outside of the neck Return appt in 4 weeks. Go to PT for coordination and balance and LE strengthening.    Follow-Up Instructions: Return in about 4 weeks (around 03/04/2016) for Review EMG/NCV assess medcation changes..   Orders:  Orders Placed This Encounter  Procedures  . Hand/Upper Extremity Injection/Arthrocentesis  . Large Joint Injection/Arthrocentesis  . Ambulatory referral to Physical Medicine Rehab   Meds ordered this encounter  Medications  . traMADol (ULTRAM) 50 MG tablet    Sig: Take 1 tablet (50 mg total) by mouth every 6 (six) hours as needed for moderate pain.    Dispense:  40 tablet    Refill:  1  . baclofen (LIORESAL) 10 MG tablet    Sig: Take 1 tablet (10 mg total) by mouth 2 (two) times daily.    Dispense:  60 each    Refill:  0      Procedures: No procedures performed   Clinical Data: Findings:  MRI spondylosis changes at most levels, fusion C4-5, auto fusion C5-6 through C7-T1. There is an area of myelomalacia right side at C4-5. No cord compression, Fusion C4-5 appears solid.     Subjective: Chief Complaint  Patient presents with  .  Neck - Follow-up    Patient comes in today to follow up on MRI Cervical Spine. Patient states still has trouble with ROM. Also has some "fluttering" in her ear, causing her to be dizzy and off balance.She is presently on medications of tylenol, 81 mg aspirin and meloxicam 15 mg. All of these have been associated with tinnitus. Pain in the right leg from hip down into the toes. Pain is in the middle of her back and in the right thigh and anterior shin, starts in the right buttock.Bowel and bladder are functioning normally.Pain into the right arm, neck pain is greater than the right arm. Radiates into the right ulnar digits.MRI was performed 4 days ago.Walking with pain right leg.Previous surgery by Dr. Saintclair Halsted C4-5 fusion in 2001,    Review of Systems  Constitutional: Positive for appetite change and fatigue. Negative for activity change, chills, diaphoresis, fever and unexpected weight change.  HENT: Positive for congestion, ear pain, hearing loss, rhinorrhea, sinus pressure and tinnitus. Negative for dental problem, drooling, ear discharge, mouth sores, nosebleeds, postnasal drip, sneezing, sore throat, trouble swallowing and voice change.   Eyes: Positive for visual disturbance. Negative for photophobia, pain, discharge, redness and itching.  Respiratory: Positive for cough, chest tightness, shortness of breath and wheezing. Negative for apnea,  choking and stridor.   Cardiovascular: Negative for chest pain, palpitations and leg swelling.  Gastrointestinal: Negative for abdominal distention, abdominal pain, constipation, diarrhea, nausea and vomiting.  Endocrine: Positive for cold intolerance. Negative for heat intolerance.  Genitourinary: Positive for difficulty urinating and hematuria. Negative for decreased urine volume, dyspareunia, dysuria, enuresis, flank pain, frequency, genital sores, menstrual problem, pelvic pain and urgency.  Musculoskeletal: Positive for arthralgias, back pain, gait  problem, joint swelling, neck pain and neck stiffness.  Skin: Negative.   Allergic/Immunologic: Negative for environmental allergies, food allergies and immunocompromised state.  Neurological: Positive for weakness, light-headedness, numbness and headaches. Negative for dizziness, tremors, seizures, syncope and speech difficulty.  Hematological: Negative.   Psychiatric/Behavioral: Positive for sleep disturbance. Negative for agitation, behavioral problems, confusion, decreased concentration, hallucinations, self-injury and suicidal ideas. The patient is not nervous/anxious and is not hyperactive.      Objective: Vital Signs: BP (!) 161/89   Pulse 80   Ht 5\' 5"  (1.651 m)   Wt 190 lb (86.2 kg)   BMI 31.62 kg/m   Physical Exam  Constitutional: She is oriented to person, place, and time. She appears well-developed and well-nourished.  HENT:  Head: Normocephalic and atraumatic.  Eyes: EOM are normal. Pupils are equal, round, and reactive to light.  Neck: No JVD present. No tracheal deviation present. No thyromegaly present.  Pulmonary/Chest: Effort normal and breath sounds normal. She has no wheezes.  Abdominal: Soft. Bowel sounds are normal.  Musculoskeletal: She exhibits tenderness.  Lymphadenopathy:    She has no cervical adenopathy.  Neurological: She is alert and oriented to person, place, and time. She exhibits abnormal muscle tone. Coordination abnormal.  Skin: Skin is warm and dry.  Psychiatric: She has a normal mood and affect. Her behavior is normal. Judgment and thought content normal.    Back Exam   Tenderness  The patient is experiencing tenderness in the cervical and lumbar.  Range of Motion  Extension: abnormal  Flexion: abnormal  Lateral Bend Right: abnormal  Lateral Bend Left: abnormal  Rotation Right: abnormal  Rotation Left: abnormal   Tests  Straight leg raise right: negative Straight leg raise left: negative  Reflexes  Patellar:  Hyporeflexic Achilles: Hyporeflexic Biceps: Hyporeflexic Babinski's sign: normal   Other  Sensation: decreased Gait: abnormal   Comments:  Lumbar fusion L2 to S1, Cervical fusion C4-5 auto fusion C5-T1.      Specialty Comments:  No specialty comments available.  Imaging: No results found.   PMFS History: Patient Active Problem List   Diagnosis Date Noted  . HNP (herniated nucleus pulposus), lumbar 08/22/2014    Priority: High    Class: Chronic  . Herniated nucleus pulposus, L2-3 right 02/04/2014    Priority: High    Class: Acute  . Lumbar stenosis with neurogenic claudication 04/30/2012    Priority: High    Class: Chronic  . Chronic constipation 06/23/2015  . Obesity 06/12/2015  . Chronic diastolic heart failure ([place]) 11/17/2014  . Urinary retention 05/04/2012    Class: Temporary  . Spondylolisthesis of lumbar region 04/30/2012    Class: Chronic  . Chest pain 03/20/2012  . Right shoulder pain 12/07/2010  . ABDOMINAL PAIN, LOWER 09/21/2009  . BACK PAIN 03/18/2009  . SYNCOPE-CAROTID SINUS 09/16/2008  . Essential hypertension 08/05/2008  . Hypothyroidism 08/23/2006  . Diabetes with neurologic complications ([place]) 90/30/0923  . Diabetic neuropathy ([place]) 08/23/2006  . CARDIOMYOPATHY 08/23/2006  . GERD 08/23/2006  . FATTY LIVER DISEASE 08/23/2006  . OSTEOARTHRITIS 08/23/2006  . [place]  DISEASE, CERVICAL 08/23/2006  . [place] DISEASE, LUMBAR 08/23/2006  . Headache(784.0) 08/23/2006  . BREAST CANCER, HX OF 08/23/2006  . HYPERPARATHYROIDISM, HX OF 08/23/2006  . RENAL CALCULUS, HX OF 08/23/2006   Past Medical History:  Diagnosis Date  . Anginal pain ([place]) 03/19/2012   saw Dr. Cathie Olden .. she thinks its more related to stomach issues  . Arthritis    "all over; mostly in my back" (03/20/2012)  . Breast cancer ([place])    "right" (03/20/2012)  . Cardiomyopathy   . Chest pain 06/2005   Hospitalized, dystolic dysfunction  . Chronic lower back pain    "have 2 herniated  discs; going to have to have a fusion" (03/20/2012)  . Family history of anesthesia complication    "father has nausea and vomiting"  . GERD (gastroesophageal reflux disease)   . H/O hiatal hernia   . History of kidney stones   . History of right mastectomy   . Hypertension    "patient states never had HTN, takes Hyzaar for heart  . Hypothyroidism   . Irritable bowel syndrome   . Migraines    "it's been a long time since I've had one" (03/20/2012)  . Osteoarthritis   . Peripheral neuropathy ([place])   . PONV (postoperative nausea and vomiting)   . Sciatica   . Type II diabetes mellitus ([facility])     Family History  Problem Relation Age of Onset  . Hypertension Mother   . Diabetes Father   . Hypertension Father   . Hypertension Sister   . Hypertension Brother   . Hypertension Brother   . Diabetes Brother   . Diabetes Other     Family Hx. of Diabetes  . Hypertension Other     Family History of HTN    Past Surgical History:  Procedure Laterality Date  . ABDOMINAL HYSTERECTOMY  1993  . adenosin cardiolite  07/2005   (+) wall motion abnormality  . AXILLARY LYMPH NODE DISSECTION  2003   squamous cell cancer  . BACK SURGERY    . CARDIAC CATHETERIZATION  ? 2005 / 2007  . CT abdomen and pelvis  02/2010   Same  . ESOPHAGOGASTRODUODENOSCOPY  2005   GERD  . KNEE ARTHROSCOPY  1990's   "right" (03/20/2012)  . LUMBAR FUSION N/A 08/22/2014   Procedure: Right L2-3 and right L1-2 transforaminal lumbar interbody fusion with pedicle screws, rods, sleeves, cages, local bone graft, Vivigen, cancellous chips;  Surgeon: Jessy Oto, MD;  Location: [facility];  Service: Orthopedics;  Laterality: N/A;  . LUMBAR LAMINECTOMY/DECOMPRESSION MICRODISCECTOMY N/A 02/04/2014   Procedure: RIGHT L2-3 MICRODISCECTOMY ;  Surgeon: Jessy Oto, MD;  Location: [facility];  Service: Orthopedics;  Laterality: N/A;  . MASTECTOMY WITH AXILLARY LYMPH NODE DISSECTION  12/2003   "right" (03/20/2012)  . POSTERIOR CERVICAL  FUSION/FORAMINOTOMY  2001  . SHOULDER ARTHROSCOPY W/ ROTATOR CUFF REPAIR  2003   Left  . THYROIDECTOMY  1977   Right  . TOTAL KNEE ARTHROPLASTY  2000   Right  . [place] of abdomen  07/2005   Fatty liver / kidney stones   Social History   Occupational History  . Retired since 2003    Social History Main Topics  . Smoking status: Never Smoker  . Smokeless tobacco: Never Used  . Alcohol use No  . Drug use: No  . Sexual activity: Not on file

## 2016-02-09 ENCOUNTER — Ambulatory Visit (INDEPENDENT_AMBULATORY_CARE_PROVIDER_SITE_OTHER): Payer: Commercial Managed Care - HMO | Admitting: Family Medicine

## 2016-02-09 DIAGNOSIS — Z23 Encounter for immunization: Secondary | ICD-10-CM

## 2016-02-11 ENCOUNTER — Ambulatory Visit: Payer: Commercial Managed Care - HMO | Attending: Specialist | Admitting: Physical Therapy

## 2016-02-11 DIAGNOSIS — R2689 Other abnormalities of gait and mobility: Secondary | ICD-10-CM | POA: Diagnosis not present

## 2016-02-11 DIAGNOSIS — G8929 Other chronic pain: Secondary | ICD-10-CM

## 2016-02-11 DIAGNOSIS — M5441 Lumbago with sciatica, right side: Secondary | ICD-10-CM | POA: Insufficient documentation

## 2016-02-11 DIAGNOSIS — R2681 Unsteadiness on feet: Secondary | ICD-10-CM

## 2016-02-11 DIAGNOSIS — M542 Cervicalgia: Secondary | ICD-10-CM | POA: Diagnosis not present

## 2016-02-11 DIAGNOSIS — M6281 Muscle weakness (generalized): Secondary | ICD-10-CM | POA: Diagnosis not present

## 2016-02-11 NOTE — Therapy (Signed)
[place], [place], [address] Phone: [phone]   Fax:  [phone]  Physical Therapy Evaluation  Patient Details  Name: [name] MRN: [id number] Date of Birth: [birth year] Referring Provider: Serafina Mitchell MD  Encounter Date: 02/11/2016      PT End of Session - 02/11/16 1512    Visit Number 1   Number of Visits 17   Date for PT Re-Evaluation 04/07/16   Authorization Type Medicare: Kx mod by 15th visit:  progress note by 10th visit   PT Start Time 1415   PT Stop Time 1508   PT Time Calculation (min) 53 min   Activity Tolerance Patient tolerated treatment well   Behavior During Therapy [facility] for tasks assessed/performed      Past Medical History:  Diagnosis Date  . Anginal pain ([place]) 03/19/2012   saw Dr. Cathie Olden .. she thinks its more related to stomach issues  . Arthritis    "all over; mostly in my back" (03/20/2012)  . Breast cancer ([place])    "right" (03/20/2012)  . Cardiomyopathy   . Chest pain 06/2005   Hospitalized, dystolic dysfunction  . Chronic lower back pain    "have 2 herniated discs; going to have to have a fusion" (03/20/2012)  . Family history of anesthesia complication    "father has nausea and vomiting"  . GERD (gastroesophageal reflux disease)   . H/O hiatal hernia   . History of kidney stones   . History of right mastectomy   . Hypertension    "patient states never had HTN, takes Hyzaar for heart  . Hypothyroidism   . Irritable bowel syndrome   . Migraines    "it's been a long time since I've had one" (03/20/2012)  . Osteoarthritis   . Peripheral neuropathy ([place])   . PONV (postoperative nausea and vomiting)   . Sciatica   . Type II diabetes mellitus ([place])     Past Surgical History:  Procedure Laterality Date  . ABDOMINAL HYSTERECTOMY  1993  . adenosin cardiolite  07/2005   (+) wall motion abnormality  . AXILLARY LYMPH NODE DISSECTION  2003   squamous cell  cancer  . BACK SURGERY    . CARDIAC CATHETERIZATION  ? 2005 / 2007  . CT abdomen and pelvis  02/2010   Same  . ESOPHAGOGASTRODUODENOSCOPY  2005   GERD  . KNEE ARTHROSCOPY  1990's   "right" (03/20/2012)  . LUMBAR FUSION N/A 08/22/2014   Procedure: Right L2-3 and right L1-2 transforaminal lumbar interbody fusion with pedicle screws, rods, sleeves, cages, local bone graft, Vivigen, cancellous chips;  Surgeon: Jessy Oto, MD;  Location: [facility];  Service: Orthopedics;  Laterality: N/A;  . LUMBAR LAMINECTOMY/DECOMPRESSION MICRODISCECTOMY N/A 02/04/2014   Procedure: RIGHT L2-3 MICRODISCECTOMY ;  Surgeon: Jessy Oto, MD;  Location: [facility];  Service: Orthopedics;  Laterality: N/A;  . MASTECTOMY WITH AXILLARY LYMPH NODE DISSECTION  12/2003   "right" (03/20/2012)  . POSTERIOR CERVICAL FUSION/FORAMINOTOMY  2001  . SHOULDER ARTHROSCOPY W/ ROTATOR CUFF REPAIR  2003   Left  . THYROIDECTOMY  1977   Right  . TOTAL KNEE ARTHROPLASTY  2000   Right  . [place] of abdomen  07/2005   Fatty liver / kidney stones    There were no vitals filed for this visit.       Subjective Assessment - 02/11/16 1421    Subjective pt is a 65 y.o with CC of chronic neck and low back  pain. neck pain started last summer with insidious onset with pt reporting sensations in her L ear with referral of pain into bil upper shoulder region, with occasional N/T into the R arm and hand.  pt reports having a cervical fusion in 2001 but is unable to say what levels. Low back pain started again about 3-4 years ago with no MOI with referral of pain and N/T to the RLE into the toes. pt reports feeling weak and having diffiuclty with balance. has a cane/ walker but doesnb't really use it much except for the last couple of weeks using the walker more in the house.    Pertinent History hx of cervical fusion, lumbar fusion L1-L3, hx of cervical fusion (pt unable to state what levels ) from previous imagine fusion at T1-C5   Limitations  Sitting;Lifting;Standing;Walking;House hold activities   How long can you sit comfortably? couple hours   How long can you stand comfortably? 10 min   How long can you walk comfortably? 20 walking (pushing her self)   Diagnostic tests MRI neck   Patient Stated Goals standing longer, decrease pain, decreased referred pain, improve balance/ strength. be able to have better posture   Currently in Pain? Yes   Pain Score 5    Pain Location Neck   Pain Orientation Right;Left   Pain Descriptors / Indicators Dull;Aching;Pins and needles   Pain Type Chronic pain   Pain Radiating Towards n/t to the R arm/ hand   Pain Onset More than a month ago   Pain Frequency Constant   Aggravating Factors  looking to the L and R, looking down,    Pain Relieving Factors tramadol,  ice/ heat   Multiple Pain Sites Yes   Pain Score 8  10/10 after 2-3 min   Pain Location Back   Pain Orientation Mid;Lower;Right   Pain Descriptors / Indicators Sharp;Tightness;Throbbing;Pins and needles  feels like it is going to break   Pain Type Chronic pain   Pain Radiating Towards referral to the RLE into the toes   Pain Onset More than a month ago   Pain Frequency Constant   Aggravating Factors  standing, walking, prolonged sitting, sitting up straight   Pain Relieving Factors tramadol, heat            [facility] PT Assessment - 02/11/16 1431      Assessment   Medical Diagnosis neck and low back pain   Referring Provider Serafina Mitchell MD   Onset Date/Surgical Date --  Neck last summer, low back 3-4 years ago   Hand Dominance Right   Next MD Visit 4 weeks   Prior Therapy yes  low back     Precautions   Precautions None     Restrictions   Weight Bearing Restrictions No     Balance Screen   Has the patient fallen in the past 6 months No   Has the patient had a decrease in activity level because of a fear of falling?  No   Is the patient reluctant to leave their home because of a fear of falling?  No     Home  [place] residence   Living Arrangements Spouse/significant other   Available Help at Discharge Available PRN/intermittently   Type of [place] to enter   Entrance Stairs-Number of Steps [place] One level   [place] - 2 wheels;Cane - single  point  back brace     Prior Function   Level of Independence Independent;Independent with basic ADLs   Vocation Retired   Teacher, music, Lexicographer,      Cognition   Overall Cognitive Status Within Functional Limits for tasks assessed     Observation/Other Assessments   Focus on Therapeutic Outcomes (FOTO)  63% limited  predicted 55% limited     Posture/Postural Control   Posture/Postural Control Postural limitations   Postural Limitations Rounded Shoulders;Forward head     AROM   Cervical Flexion 20  ERP   Cervical Extension 20   Cervical - Right Side Bend 18  ERP   Cervical - Left Side Bend 18   Cervical - Right Rotation 39  ERP   Cervical - Left Rotation 39  ERP   Lumbar Flexion 56  ER   Lumbar Extension 6   Lumbar - Right Side Bend 6   Lumbar - Left Side Bend 6   Lumbar - Right Rotation 10%   Lumbar - Left Rotation 25%  pain during motion     Strength   Right/Left Hip Right;Left   Right Hip Flexion 3+/5   Right Hip Extension 3-/5   Right Hip ABduction 3-/5   Right Hip ADduction 3/5   Left Hip Flexion 3+/5   Left Hip Extension 3+/5   Left Hip ABduction 3-/5   Left Hip ADduction 4-/5   Right/Left Knee Right;Left   Right Knee Flexion 4-/5   Right Knee Extension 4-/5   Left Knee Flexion 4/5   Left Knee Extension 4+/5     Palpation   Palpation comment tightness in bil upper traps and levator scapulae with multiple trigger points, tightness in the SCM with referral to bil ears, and  tightness with pain in the sub-occipitals.  significant tightness in bil lumbar parapspinals and R QL      Special Tests     Special Tests Cervical;Lumbar   Cervical Tests Dictraction;Vertebral Artery Test;Spurling's;other   Lumbar Tests Slump Test;Straight Leg Raise;Prone Knee Bend Test     Spurling's   Findings Positive   Side Right   Comment performed gently: reproduced sx into R arm     Distraction Test   Findngs Positive   side Right   Comment performed gently: reported decreased pain in R arm     Vertebral Artery Test    Findings Not Tested     other    Findings Positive  ULNT (Median nerve )   Side Right     Slump test   Findings Positive   Side Right     Prone Knee Bend Test   Findings Positive   Side --  bil for tightness     Straight Leg Raise   Findings Positive   Side  Right   Comment only RLE radicular sx with R SLR     Ambulation/Gait   Ambulation/Gait Yes   Gait Pattern Step-through pattern;Decreased stride length;Trendelenburg;Antalgic;Lateral hip instability;Lateral trunk lean to right;Decreased trunk rotation;Trunk flexed;Wide base of support  bilateral toe out gait                           PT Education - 02/11/16 1511    Education provided Yes   Education Details evaluation findings, POC, goals, HEP with form and rationale, anatomy of the neck and Quadratus lumborum and effects and causes of activation due to weakness working on hiking the hip.    Person(s)  Educated Patient   Methods Explanation;Verbal cues;Handout   Comprehension Verbalized understanding;Verbal cues required;Tactile cues required          PT Short Term Goals - 02/11/16 1519      PT SHORT TERM GOAL #1   Title pt will be I with inital HEP (03/13/2016)   Time 4   Period Weeks   Status New     PT SHORT TERM GOAL #2   Title she will be able to verbalize and demo proper posture and lifting/ carrying mechanics to prevent and reduce neck and low back pain (03/13/2016)   Time 4   Period Weeks   Status New     PT SHORT TERM GOAL #3   Title she will demonstrate reduce  muscle spasm in bil upper traps and R quadratus lumborum to decrease pain to </= 6/10 in neck /back and improve cervical and trunk mobility (03/12/2016)   Time 4   Period Weeks   Status New     PT SHORT TERM GOAL #4   Title she will improve cervical mobility by >/= 5 degrees in all planes with </= 5/10 pain to assist with functional progression (03/12/2016)   Time 4   Period Weeks   Status New     PT SHORT TERM GOAL #5   Title pt will improve lumbar flexion/ extension by >/= 5 degrees and R rotation by to >/= 15% to assist with function with </= 5/10 pain (03/12/2016)   Time 4   Period Weeks   Status New     Additional Short Term Goals   Additional Short Term Goals Yes     PT SHORT TERM GOAL #6   Title assess [place] balance and make goals accordingly   Time 2   Period Weeks   Status New           PT Long Term Goals - 02/11/16 1524      PT LONG TERM GOAL #1   Title pt will be I with all HEP given as of last visit (04/07/2016)   Time 8   Period Weeks   Status New     PT LONG TERM GOAL #2   Title pt will increase cervical flexion / rotation by >/= 15 degrees with </= 2/10 pain to assist with safety during driving and ADLS (92/02/9416)   Time 8   Period Weeks   Status New     PT LONG TERM GOAL #3   Title pt will increase bil LE strength to >/= 4/5 with </= 2/10 pain for prolong walking/ standing ADLS and promote safety (04/07/2016)   Time 8   Period Weeks   Status New     PT LONG TERM GOAL #4   Title pt will be able to walk/ stand for >/=30 min with </= 2/10 pain utilizing </= LRAD for safety to promote functional endurance required or ADLs (04/07/2016)   Time 8   Period Weeks   Status New     PT LONG TERM GOAL #5   Title pt will increase her FOTO score to </=55% limited to demonstrate improvement in function at discharge (04/07/2016)   Time 8   Period Weeks   Status New               Plan - 02/11/16 1513    Clinical Impression Statement Mrs Mcavoy  presents to OPPT as high complexity evaluation due to involved PMhx, fluctuating/ evolving symptoms and evaluation findings regarding chronic low back and  neck pain. She demonstrates significant limitations with cervical and lumbar AROM secondary to pain and muscle tightness. weakness in bil LE with R>L. pt report history of feeling unstable but time constraints limited tested; plan to assess balance next visit. cervical special testing cluster demonstrates high probabiliyt of radiculopathy. Palpation revealed significant muscle tightness and pain in both neck and back. She would benefit from physcial therpay to decrease neck/ low back pain, reduce muscle tightness, increase strength/ balance, and maximize her function by addressing the deficits listed.    Rehab Potential Good   PT Frequency 3x / week   PT Duration 8 weeks   PT Treatment/Interventions ADLs/Self Care Home Management;Cryotherapy;Electrical Stimulation;Iontophoresis 4mg /ml Dexamethasone;Moist Heat;Ultrasound;Gait training;Therapeutic activities;Therapeutic exercise;Dry needling;Taping;Patient/family education;Manual techniques;Balance training;Neuromuscular re-education;Passive range of motion;Stair training;Functional mobility training;DME Instruction   PT Next Visit Plan assess review HEP, assess BERG Balance,  manual to calm down tightness, stretching, bil hip strengthening, posture education   PT Home Exercise Plan clam shells, upper trap stretch, chin tucks (in supine), QL stretching, pelvic tilts,    Consulted and Agree with Plan of Care Patient      Patient will benefit from skilled therapeutic intervention in order to improve the following deficits and impairments:  Abnormal gait, Decreased activity tolerance, Decreased balance, Decreased endurance, Pain, Hypomobility, Decreased strength, Increased fascial restricitons, Difficulty walking, Decreased range of motion, Improper body mechanics, Postural dysfunction, Decreased  mobility  Visit Diagnosis: Chronic bilateral low back pain with right-sided sciatica - Plan: PT plan of care cert/re-cert  Muscle weakness (generalized) - Plan: PT plan of care cert/re-cert  Cervicalgia - Plan: PT plan of care cert/re-cert  Other abnormalities of gait and mobility - Plan: PT plan of care cert/re-cert  Unsteadiness on feet - Plan: PT plan of care cert/re-cert      G-Codes - 95/18/84 1535    Functional Assessment Tool Used clinical judgement/ FOTO    Functional Limitation Mobility: Walking and moving around   Mobility: Walking and Moving Around Current Status [phone]) At least 60 percent but less than 80 percent impaired, limited or restricted   Mobility: Walking and Moving Around Goal Status [phone]) At least 40 percent but less than 60 percent impaired, limited or restricted       Problem List Patient Active Problem List   Diagnosis Date Noted  . Chronic constipation 06/23/2015  . Obesity 06/12/2015  . Chronic diastolic heart failure ([place]) 11/17/2014  . HNP (herniated nucleus pulposus), lumbar 08/22/2014    Class: Chronic  . Herniated nucleus pulposus, L2-3 right 02/04/2014    Class: Acute  . Urinary retention 05/04/2012    Class: Temporary  . Spondylolisthesis of lumbar region 04/30/2012    Class: Chronic  . Lumbar stenosis with neurogenic claudication 04/30/2012    Class: Chronic  . Chest pain 03/20/2012  . Right shoulder pain 12/07/2010  . ABDOMINAL PAIN, LOWER 09/21/2009  . BACK PAIN 03/18/2009  . SYNCOPE-CAROTID SINUS 09/16/2008  . Essential hypertension 08/05/2008  . Hypothyroidism 08/23/2006  . Diabetes with neurologic complications ([place]) 10/93/2355  . Diabetic neuropathy ([place]) 08/23/2006  . CARDIOMYOPATHY 08/23/2006  . GERD 08/23/2006  . FATTY LIVER DISEASE 08/23/2006  . OSTEOARTHRITIS 08/23/2006  . [place] DISEASE, CERVICAL 08/23/2006  . [place] DISEASE, LUMBAR 08/23/2006  . Headache(784.0) 08/23/2006  . BREAST CANCER, HX OF 08/23/2006  .  HYPERPARATHYROIDISM, HX OF 08/23/2006  . RENAL CALCULUS, HX OF 08/23/2006   Starr [place] PT, DPT, LAT, ATC  02/11/16  3:39 PM      [facility]  [place], [place], [address] Phone: [phone]   Fax:  [phone]  Name: [name] MRN: [id number] Date of Birth: Aug 19, 1950

## 2016-02-17 ENCOUNTER — Ambulatory Visit: Payer: Commercial Managed Care - HMO | Admitting: Physical Therapy

## 2016-02-17 DIAGNOSIS — M542 Cervicalgia: Secondary | ICD-10-CM | POA: Diagnosis not present

## 2016-02-17 DIAGNOSIS — R2681 Unsteadiness on feet: Secondary | ICD-10-CM

## 2016-02-17 DIAGNOSIS — M5441 Lumbago with sciatica, right side: Secondary | ICD-10-CM | POA: Diagnosis not present

## 2016-02-17 DIAGNOSIS — M6281 Muscle weakness (generalized): Secondary | ICD-10-CM | POA: Diagnosis not present

## 2016-02-17 DIAGNOSIS — R2689 Other abnormalities of gait and mobility: Secondary | ICD-10-CM

## 2016-02-17 DIAGNOSIS — G8929 Other chronic pain: Secondary | ICD-10-CM | POA: Diagnosis not present

## 2016-02-17 NOTE — Therapy (Signed)
[place], [place], [address] Phone: [phone]   Fax:  [phone]  Physical Therapy Treatment  Patient Details  Name: [name] MRN: [id number] Date of Birth: July 16, 1950 Referring Provider: Serafina Mitchell MD  Encounter Date: 02/17/2016      PT End of Session - 02/17/16 1316    Visit Number 2   Number of Visits 17   Date for PT Re-Evaluation 04/07/16   Authorization Type Medicare: Kx mod by 15th visit:  progress note by 10th visit   PT Start Time 1145   PT Stop Time 1245   PT Time Calculation (min) 60 min      Past Medical History:  Diagnosis Date  . Anginal pain ([place]) 03/19/2012   saw Dr. Cathie Olden .. she thinks its more related to stomach issues  . Arthritis    "all over; mostly in my back" (03/20/2012)  . Breast cancer ([place])    "right" (03/20/2012)  . Cardiomyopathy   . Chest pain 06/2005   Hospitalized, dystolic dysfunction  . Chronic lower back pain    "have 2 herniated discs; going to have to have a fusion" (03/20/2012)  . Family history of anesthesia complication    "father has nausea and vomiting"  . GERD (gastroesophageal reflux disease)   . H/O hiatal hernia   . History of kidney stones   . History of right mastectomy   . Hypertension    "patient states never had HTN, takes Hyzaar for heart  . Hypothyroidism   . Irritable bowel syndrome   . Migraines    "it's been a long time since I've had one" (03/20/2012)  . Osteoarthritis   . Peripheral neuropathy ([place])   . PONV (postoperative nausea and vomiting)   . Sciatica   . Type II diabetes mellitus ([place])     Past Surgical History:  Procedure Laterality Date  . ABDOMINAL HYSTERECTOMY  1993  . adenosin cardiolite  07/2005   (+) wall motion abnormality  . AXILLARY LYMPH NODE DISSECTION  2003   squamous cell cancer  . BACK SURGERY    . CARDIAC CATHETERIZATION  ? 2005 / 2007  . CT abdomen and pelvis  02/2010   Same  .  ESOPHAGOGASTRODUODENOSCOPY  2005   GERD  . KNEE ARTHROSCOPY  1990's   "right" (03/20/2012)  . LUMBAR FUSION N/A 08/22/2014   Procedure: Right L2-3 and right L1-2 transforaminal lumbar interbody fusion with pedicle screws, rods, sleeves, cages, local bone graft, Vivigen, cancellous chips;  Surgeon: Jessy Oto, MD;  Location: [facility];  Service: Orthopedics;  Laterality: N/A;  . LUMBAR LAMINECTOMY/DECOMPRESSION MICRODISCECTOMY N/A 02/04/2014   Procedure: RIGHT L2-3 MICRODISCECTOMY ;  Surgeon: Jessy Oto, MD;  Location: [facility];  Service: Orthopedics;  Laterality: N/A;  . MASTECTOMY WITH AXILLARY LYMPH NODE DISSECTION  12/2003   "right" (03/20/2012)  . POSTERIOR CERVICAL FUSION/FORAMINOTOMY  2001  . SHOULDER ARTHROSCOPY W/ ROTATOR CUFF REPAIR  2003   Left  . THYROIDECTOMY  1977   Right  . TOTAL KNEE ARTHROPLASTY  2000   Right  . [place] of abdomen  07/2005   Fatty liver / kidney stones    There were no vitals filed for this visit.      Subjective Assessment - 02/17/16 1149    Subjective I get a little dizzy when I bend over.    Currently in Pain? Yes   Pain Score 2    Pain Location Neck   Pain Score  4   Pain Location Back                         [facility] Adult PT Treatment/Exercise - 02/17/16 0001      Standardized Balance Assessment   Standardized Balance Assessment Berg Balance Test     Berg Balance Test   Sit to Stand Able to stand  independently using hands   Standing Unsupported Able to stand safely 2 minutes   Sitting with Back Unsupported but Feet Supported on Floor or Stool Able to sit safely and securely 2 minutes   Stand to Sit Controls descent by using hands   Transfers Able to transfer safely, definite need of hands   Standing Unsupported with Eyes Closed Able to stand 10 seconds safely   Standing Ubsupported with Feet Together Able to place feet together independently and stand for 1 minute with supervision   From Standing, Reach Forward with  Outstretched Arm Can reach forward >12 cm safely (5")   From Standing Position, Pick up Object from Floor Able to pick up shoe, needs supervision   From Standing Position, Turn to Look Behind Over each Shoulder Looks behind one side only/other side shows less weight shift   Turn 360 Degrees Able to turn 360 degrees safely but slowly   Standing Unsupported, Alternately Place Feet on Step/Stool Able to stand independently and complete 8 steps >20 seconds   Standing Unsupported, One Foot in Front Able to plae foot ahead of the other independently and hold 30 seconds   Standing on One Leg Tries to lift leg/unable to hold 3 seconds but remains standing independently   Total Score 42     Exercises   Exercises Neck;Lumbar     Lumbar Exercises: Standing   Other Standing Lumbar Exercises QL stretch-reviewed at wall per HEP however pt has increased pain. Tried standing side bends which were tolerated better x 5 each way with  and without UE      Modalities   Modalities Moist Heat     Moist Heat Therapy   Number Minutes Moist Heat 15 Minutes   Moist Heat Location Lumbar Spine  extra pillow case     Manual Therapy   Manual Therapy Passive ROM   Passive ROM knee to chest, hip IR/ER      Neck Exercises: Stretches   Upper Trapezius Stretch 3 reps;10 seconds                  PT Short Term Goals - 02/11/16 1519      PT SHORT TERM GOAL #1   Title pt will be I with inital HEP (03/13/2016)   Time 4   Period Weeks   Status New     PT SHORT TERM GOAL #2   Title she will be able to verbalize and demo proper posture and lifting/ carrying mechanics to prevent and reduce neck and low back pain (03/13/2016)   Time 4   Period Weeks   Status New     PT SHORT TERM GOAL #3   Title she will demonstrate reduce muscle spasm in bil upper traps and R quadratus lumborum to decrease pain to </= 6/10 in neck /back and improve cervical and trunk mobility (03/12/2016)   Time 4   Period Weeks   Status  New     PT SHORT TERM GOAL #4   Title she will improve cervical mobility by >/= 5 degrees in all planes with </= 5/10  pain to assist with functional progression (03/12/2016)   Time 4   Period Weeks   Status New     PT SHORT TERM GOAL #5   Title pt will improve lumbar flexion/ extension by >/= 5 degrees and R rotation by to >/= 15% to assist with function with </= 5/10 pain (03/12/2016)   Time 4   Period Weeks   Status New     Additional Short Term Goals   Additional Short Term Goals Yes     PT SHORT TERM GOAL #6   Title assess Berg balance and make goals accordingly   Time 2   Period Weeks   Status New           PT Long Term Goals - 02/11/16 1524      PT LONG TERM GOAL #1   Title pt will be I with all HEP given as of last visit (04/07/2016)   Time 8   Period Weeks   Status New     PT LONG TERM GOAL #2   Title pt will increase cervical flexion / rotation by >/= 15 degrees with </= 2/10 pain to assist with safety during driving and ADLS (11/21/8869)   Time 8   Period Weeks   Status New     PT LONG TERM GOAL #3   Title pt will increase bil LE strength to >/= 4/5 with </= 2/10 pain for prolong walking/ standing ADLS and promote safety (04/07/2016)   Time 8   Period Weeks   Status New     PT LONG TERM GOAL #4   Title pt will be able to walk/ stand for >/=30 min with </= 2/10 pain utilizing </= LRAD for safety to promote functional endurance required or ADLs (04/07/2016)   Time 8   Period Weeks   Status New     PT LONG TERM GOAL #5   Title pt will increase her FOTO score to </=55% limited to demonstrate improvement in function at discharge (04/07/2016)   Time 8   Period Weeks   Status New               Plan - 02/17/16 1233    Clinical Impression Statement BERG 42/56. Pt uses cane outside of Home.  Suggested standing side bends instead of QL stretch at wall due to  increased pain and unsteadiness. Increased pain in lumbar due to time spent standing for  BERG. MHP at end of session.    PT Next Visit Plan assess review HEP,balance,  manual to calm down tightness, stretching, bil hip strengthening, posture education; pt has physioball and would like to know exercises.    PT Home Exercise Plan clam shells, upper trap stretch, chin tucks (in supine), QL stretching, pelvic tilts,       Patient will benefit from skilled therapeutic intervention in order to improve the following deficits and impairments:  Abnormal gait, Decreased activity tolerance, Decreased balance, Decreased endurance, Pain, Hypomobility, Decreased strength, Increased fascial restricitons, Difficulty walking, Decreased range of motion, Improper body mechanics, Postural dysfunction, Decreased mobility  Visit Diagnosis: Chronic bilateral low back pain with right-sided sciatica  Muscle weakness (generalized)  Cervicalgia  Other abnormalities of gait and mobility  Unsteadiness on feet     Problem List Patient Active Problem List   Diagnosis Date Noted  . Chronic constipation 06/23/2015  . Obesity 06/12/2015  . Chronic diastolic heart failure ([place]) 11/17/2014  . HNP (herniated nucleus pulposus), lumbar 08/22/2014    Class: Chronic  .  Herniated nucleus pulposus, L2-3 right 02/04/2014    Class: Acute  . Urinary retention 05/04/2012    Class: Temporary  . Spondylolisthesis of lumbar region 04/30/2012    Class: Chronic  . Lumbar stenosis with neurogenic claudication 04/30/2012    Class: Chronic  . Chest pain 03/20/2012  . Right shoulder pain 12/07/2010  . ABDOMINAL PAIN, LOWER 09/21/2009  . BACK PAIN 03/18/2009  . SYNCOPE-CAROTID SINUS 09/16/2008  . Essential hypertension 08/05/2008  . Hypothyroidism 08/23/2006  . Diabetes with neurologic complications ([place]) 16/01/9603  . Diabetic neuropathy ([place]) 08/23/2006  . CARDIOMYOPATHY 08/23/2006  . GERD 08/23/2006  . FATTY LIVER DISEASE 08/23/2006  . OSTEOARTHRITIS 08/23/2006  . [place] DISEASE, CERVICAL 08/23/2006  . [place]  DISEASE, LUMBAR 08/23/2006  . Headache(784.0) 08/23/2006  . BREAST CANCER, HX OF 08/23/2006  . HYPERPARATHYROIDISM, HX OF 08/23/2006  . RENAL CALCULUS, HX OF 08/23/2006    Dorene Ar, PTA 02/17/2016, 2:50 PM  [facility] [address] [place], [place], [address] Phone: [phone]   Fax:  [phone]  Name: [name] MRN: [id number] Date of Birth: Oct 28, 1950

## 2016-02-18 ENCOUNTER — Ambulatory Visit: Payer: Commercial Managed Care - HMO | Admitting: Physical Therapy

## 2016-02-18 DIAGNOSIS — G8929 Other chronic pain: Secondary | ICD-10-CM | POA: Diagnosis not present

## 2016-02-18 DIAGNOSIS — M6281 Muscle weakness (generalized): Secondary | ICD-10-CM | POA: Diagnosis not present

## 2016-02-18 DIAGNOSIS — R2689 Other abnormalities of gait and mobility: Secondary | ICD-10-CM

## 2016-02-18 DIAGNOSIS — M542 Cervicalgia: Secondary | ICD-10-CM

## 2016-02-18 DIAGNOSIS — R2681 Unsteadiness on feet: Secondary | ICD-10-CM | POA: Diagnosis not present

## 2016-02-18 DIAGNOSIS — M5441 Lumbago with sciatica, right side: Principal | ICD-10-CM

## 2016-02-18 NOTE — Therapy (Signed)
[place], [place], [address] Phone: [phone]   Fax:  [phone]  Physical Therapy Treatment  Patient Details  Name: [name] MRN: [id number] Date of Birth: [birth year] Referring Provider: Serafina Mitchell MD  Encounter Date: 02/18/2016      PT End of Session - 02/18/16 1510    Visit Number 3   Number of Visits 17   Date for PT Re-Evaluation 04/07/16   Authorization Type Medicare: Kx mod by 15th visit:  progress note by 10th visit   PT Start Time 0300   PT Stop Time 0400   PT Time Calculation (min) 60 min      Past Medical History:  Diagnosis Date  . Anginal pain ([place]) 03/19/2012   saw Dr. Cathie Olden .. she thinks its more related to stomach issues  . Arthritis    "all over; mostly in my back" (03/20/2012)  . Breast cancer ([place])    "right" (03/20/2012)  . Cardiomyopathy   . Chest pain 06/2005   Hospitalized, dystolic dysfunction  . Chronic lower back pain    "have 2 herniated discs; going to have to have a fusion" (03/20/2012)  . Family history of anesthesia complication    "father has nausea and vomiting"  . GERD (gastroesophageal reflux disease)   . H/O hiatal hernia   . History of kidney stones   . History of right mastectomy   . Hypertension    "patient states never had HTN, takes Hyzaar for heart  . Hypothyroidism   . Irritable bowel syndrome   . Migraines    "it's been a long time since I've had one" (03/20/2012)  . Osteoarthritis   . Peripheral neuropathy ([place])   . PONV (postoperative nausea and vomiting)   . Sciatica   . Type II diabetes mellitus ([place])     Past Surgical History:  Procedure Laterality Date  . ABDOMINAL HYSTERECTOMY  1993  . adenosin cardiolite  07/2005   (+) wall motion abnormality  . AXILLARY LYMPH NODE DISSECTION  2003   squamous cell cancer  . BACK SURGERY    . CARDIAC CATHETERIZATION  ? 2005 / 2007  . CT abdomen and pelvis  02/2010   Same  .  ESOPHAGOGASTRODUODENOSCOPY  2005   GERD  . KNEE ARTHROSCOPY  1990's   "right" (03/20/2012)  . LUMBAR FUSION N/A 08/22/2014   Procedure: Right L2-3 and right L1-2 transforaminal lumbar interbody fusion with pedicle screws, rods, sleeves, cages, local bone graft, Vivigen, cancellous chips;  Surgeon: Jessy Oto, MD;  Location: [facility];  Service: Orthopedics;  Laterality: N/A;  . LUMBAR LAMINECTOMY/DECOMPRESSION MICRODISCECTOMY N/A 02/04/2014   Procedure: RIGHT L2-3 MICRODISCECTOMY ;  Surgeon: Jessy Oto, MD;  Location: [facility];  Service: Orthopedics;  Laterality: N/A;  . MASTECTOMY WITH AXILLARY LYMPH NODE DISSECTION  12/2003   "right" (03/20/2012)  . POSTERIOR CERVICAL FUSION/FORAMINOTOMY  2001  . SHOULDER ARTHROSCOPY W/ ROTATOR CUFF REPAIR  2003   Left  . THYROIDECTOMY  1977   Right  . TOTAL KNEE ARTHROPLASTY  2000   Right  . [place] of abdomen  07/2005   Fatty liver / kidney stones    There were no vitals filed for this visit.      Subjective Assessment - 02/18/16 1512    Subjective I was a little sore but I feel pretty good. My back is the worst.   Currently in Pain? Yes   Pain Score 2    Pain Location  Neck   Pain Orientation Right;Left   Pain Score 3   Pain Location Back   Pain Orientation Mid;Lower;Right                         [facility] Adult PT Treatment/Exercise - 02/18/16 0001      Lumbar Exercises: Stretches   Lower Trunk Rotation 5 reps;10 seconds     Lumbar Exercises: Aerobic   Stationary Bike Nustep L3 x 5 minutes     Lumbar Exercises: Supine   Clam Limitations x 40 reps green band      Lumbar Exercises: Sidelying   Other Sidelying Lumbar Exercises Side lying QL stretch with towel roll during soft tissue      Moist Heat Therapy   Number Minutes Moist Heat 15 Minutes   Moist Heat Location Lumbar Spine;Cervical     Manual Therapy   Manual Therapy Soft tissue mobilization   Soft tissue mobilization L QL in sidelying, right upper trap, levator,  passive upper trap and levator stretch                  PT Short Term Goals - 02/11/16 1519      PT SHORT TERM GOAL #1   Title pt will be I with inital HEP (03/13/2016)   Time 4   Period Weeks   Status New     PT SHORT TERM GOAL #2   Title she will be able to verbalize and demo proper posture and lifting/ carrying mechanics to prevent and reduce neck and low back pain (03/13/2016)   Time 4   Period Weeks   Status New     PT SHORT TERM GOAL #3   Title she will demonstrate reduce muscle spasm in bil upper traps and R quadratus lumborum to decrease pain to </= 6/10 in neck /back and improve cervical and trunk mobility (03/12/2016)   Time 4   Period Weeks   Status New     PT SHORT TERM GOAL #4   Title she will improve cervical mobility by >/= 5 degrees in all planes with </= 5/10 pain to assist with functional progression (03/12/2016)   Time 4   Period Weeks   Status New     PT SHORT TERM GOAL #5   Title pt will improve lumbar flexion/ extension by >/= 5 degrees and R rotation by to >/= 15% to assist with function with </= 5/10 pain (03/12/2016)   Time 4   Period Weeks   Status New     Additional Short Term Goals   Additional Short Term Goals Yes     PT SHORT TERM GOAL #6   Title assess Berg balance and make goals accordingly   Time 2   Period Weeks   Status New           PT Long Term Goals - 02/11/16 1524      PT LONG TERM GOAL #1   Title pt will be I with all HEP given as of last visit (04/07/2016)   Time 8   Period Weeks   Status New     PT LONG TERM GOAL #2   Title pt will increase cervical flexion / rotation by >/= 15 degrees with </= 2/10 pain to assist with safety during driving and ADLS (73/71/0626)   Time 8   Period Weeks   Status New     PT LONG TERM GOAL #3   Title pt will increase bil  LE strength to >/= 4/5 with </= 2/10 pain for prolong walking/ standing ADLS and promote safety (04/07/2016)   Time 8   Period Weeks   Status New     PT  LONG TERM GOAL #4   Title pt will be able to walk/ stand for >/=30 min with </= 2/10 pain utilizing </= LRAD for safety to promote functional endurance required or ADLs (04/07/2016)   Time 8   Period Weeks   Status New     PT LONG TERM GOAL #5   Title pt will increase her FOTO score to </=55% limited to demonstrate improvement in function at discharge (04/07/2016)   Time 8   Period Weeks   Status New               Plan - 02/18/16 1603    Clinical Impression Statement Focues Manual soft tissue to right upper trap and left QL Less tenderness post session. HMP neck and back for soreness at end of treatment.    PT Next Visit Plan assess review HEP,balance,  manual to calm down tightness, stretching, bil hip strengthening, posture education; pt has physioball and would like to know exercises.    PT Home Exercise Plan clam shells, upper trap stretch, chin tucks (in supine), QL stretching, pelvic tilts,    Consulted and Agree with Plan of Care Patient      Patient will benefit from skilled therapeutic intervention in order to improve the following deficits and impairments:  Abnormal gait, Decreased activity tolerance, Decreased balance, Decreased endurance, Pain, Hypomobility, Decreased strength, Increased fascial restricitons, Difficulty walking, Decreased range of motion, Improper body mechanics, Postural dysfunction, Decreased mobility  Visit Diagnosis: Chronic bilateral low back pain with right-sided sciatica  Muscle weakness (generalized)  Unsteadiness on feet  Cervicalgia  Other abnormalities of gait and mobility     Problem List Patient Active Problem List   Diagnosis Date Noted  . Chronic constipation 06/23/2015  . Obesity 06/12/2015  . Chronic diastolic heart failure ([place]) 11/17/2014  . HNP (herniated nucleus pulposus), lumbar 08/22/2014    Class: Chronic  . Herniated nucleus pulposus, L2-3 right 02/04/2014    Class: Acute  . Urinary retention 05/04/2012     Class: Temporary  . Spondylolisthesis of lumbar region 04/30/2012    Class: Chronic  . Lumbar stenosis with neurogenic claudication 04/30/2012    Class: Chronic  . Chest pain 03/20/2012  . Right shoulder pain 12/07/2010  . ABDOMINAL PAIN, LOWER 09/21/2009  . BACK PAIN 03/18/2009  . SYNCOPE-CAROTID SINUS 09/16/2008  . Essential hypertension 08/05/2008  . Hypothyroidism 08/23/2006  . Diabetes with neurologic complications ([place]) 83/33/8329  . Diabetic neuropathy ([place]) 08/23/2006  . CARDIOMYOPATHY 08/23/2006  . GERD 08/23/2006  . FATTY LIVER DISEASE 08/23/2006  . OSTEOARTHRITIS 08/23/2006  . [place] DISEASE, CERVICAL 08/23/2006  . [place] DISEASE, LUMBAR 08/23/2006  . Headache(784.0) 08/23/2006  . BREAST CANCER, HX OF 08/23/2006  . HYPERPARATHYROIDISM, HX OF 08/23/2006  . RENAL CALCULUS, HX OF 08/23/2006    Dorene Ar, PTA 02/18/2016, 5:34 PM  [place], [place], [address] Phone: [phone]   Fax:  [phone]  Name: [name] MRN: [id number] Date of Birth: [birth year]

## 2016-02-19 ENCOUNTER — Encounter (INDEPENDENT_AMBULATORY_CARE_PROVIDER_SITE_OTHER): Payer: Commercial Managed Care - HMO | Admitting: Physical Medicine and Rehabilitation

## 2016-02-19 ENCOUNTER — Telehealth (INDEPENDENT_AMBULATORY_CARE_PROVIDER_SITE_OTHER): Payer: Self-pay | Admitting: Physical Medicine and Rehabilitation

## 2016-02-19 NOTE — Telephone Encounter (Signed)
Pt did not have [employer] authorization. She is also going to try and get it since PCP will not answer phone. But also try on our end. Please reschedule pts appt.

## 2016-02-19 NOTE — Telephone Encounter (Signed)
Per referral no auth required per website. Ref# [id number]. I called patient and scheduled her for 02/26/16 at 945.

## 2016-02-22 ENCOUNTER — Ambulatory Visit: Payer: Commercial Managed Care - HMO | Admitting: Physical Therapy

## 2016-02-22 DIAGNOSIS — M5441 Lumbago with sciatica, right side: Secondary | ICD-10-CM | POA: Diagnosis not present

## 2016-02-22 DIAGNOSIS — R2681 Unsteadiness on feet: Secondary | ICD-10-CM

## 2016-02-22 DIAGNOSIS — R2689 Other abnormalities of gait and mobility: Secondary | ICD-10-CM | POA: Diagnosis not present

## 2016-02-22 DIAGNOSIS — M6281 Muscle weakness (generalized): Secondary | ICD-10-CM

## 2016-02-22 DIAGNOSIS — M542 Cervicalgia: Secondary | ICD-10-CM | POA: Diagnosis not present

## 2016-02-22 DIAGNOSIS — G8929 Other chronic pain: Secondary | ICD-10-CM | POA: Diagnosis not present

## 2016-02-22 NOTE — Therapy (Signed)
[place], [place], [address] Phone: [phone]   Fax:  [phone]  Physical Therapy Treatment  Patient Details  Name: [name] MRN: [id number] Date of Birth: [birth year] Referring Provider: Serafina Mitchell MD  Encounter Date: 02/22/2016    Past Medical History:  Diagnosis Date  . Anginal pain ([place]) 03/19/2012   saw Dr. Cathie Olden .. she thinks its more related to stomach issues  . Arthritis    "all over; mostly in my back" (03/20/2012)  . Breast cancer ([place])    "right" (03/20/2012)  . Cardiomyopathy   . Chest pain 06/2005   Hospitalized, dystolic dysfunction  . Chronic lower back pain    "have 2 herniated discs; going to have to have a fusion" (03/20/2012)  . Family history of anesthesia complication    "father has nausea and vomiting"  . GERD (gastroesophageal reflux disease)   . H/O hiatal hernia   . History of kidney stones   . History of right mastectomy   . Hypertension    "patient states never had HTN, takes Hyzaar for heart  . Hypothyroidism   . Irritable bowel syndrome   . Migraines    "it's been a long time since I've had one" (03/20/2012)  . Osteoarthritis   . Peripheral neuropathy ([place])   . PONV (postoperative nausea and vomiting)   . Sciatica   . Type II diabetes mellitus ([place])     Past Surgical History:  Procedure Laterality Date  . ABDOMINAL HYSTERECTOMY  1993  . adenosin cardiolite  07/2005   (+) wall motion abnormality  . AXILLARY LYMPH NODE DISSECTION  2003   squamous cell cancer  . BACK SURGERY    . CARDIAC CATHETERIZATION  ? 2005 / 2007  . CT abdomen and pelvis  02/2010   Same  . ESOPHAGOGASTRODUODENOSCOPY  2005   GERD  . KNEE ARTHROSCOPY  1990's   "right" (03/20/2012)  . LUMBAR FUSION N/A 08/22/2014   Procedure: Right L2-3 and right L1-2 transforaminal lumbar interbody fusion with pedicle screws, rods, sleeves, cages, local bone graft, Vivigen, cancellous  chips;  Surgeon: Jessy Oto, MD;  Location: [facility];  Service: Orthopedics;  Laterality: N/A;  . LUMBAR LAMINECTOMY/DECOMPRESSION MICRODISCECTOMY N/A 02/04/2014   Procedure: RIGHT L2-3 MICRODISCECTOMY ;  Surgeon: Jessy Oto, MD;  Location: [facility];  Service: Orthopedics;  Laterality: N/A;  . MASTECTOMY WITH AXILLARY LYMPH NODE DISSECTION  12/2003   "right" (03/20/2012)  . POSTERIOR CERVICAL FUSION/FORAMINOTOMY  2001  . SHOULDER ARTHROSCOPY W/ ROTATOR CUFF REPAIR  2003   Left  . THYROIDECTOMY  1977   Right  . TOTAL KNEE ARTHROPLASTY  2000   Right  . [place] of abdomen  07/2005   Fatty liver / kidney stones    There were no vitals filed for this visit.      Subjective Assessment - 02/22/16 1339    Subjective "I feel like I am getting a little better"    Currently in Pain? Yes   Pain Score 2    Pain Location Neck   Pain Orientation Right;Left   Pain Descriptors / Indicators Aching   Pain Type Chronic pain   Pain Onset More than a month ago   Pain Frequency Intermittent   Aggravating Factors  looking to the L and R, looking down   Pain Relieving Factors tramadol, ice/ heat   Pain Score 2   Pain Location Back   Aggravating Factors  prolonged standing, walking  Pain Relieving Factors tramadol, heat                         [facility] Adult PT Treatment/Exercise - 02/22/16 1344      Lumbar Exercises: Stretches   Active Hamstring Stretch 2 reps;30 seconds  performed bil   Lower Trunk Rotation 3 reps;20 seconds     Lumbar Exercises: Aerobic   Stationary Bike Nustep L3 x 7 minutes for endurance (placed spacer medial to heel to promote better alignment)     Lumbar Exercises: Standing   Other Standing Lumbar Exercises standing QL stretch holding on to chair      Lumbar Exercises: Sidelying   Clam 15 reps;1 second  x 2 sets; performed bil     Moist Heat Therapy   Number Minutes Moist Heat 10 Minutes   Moist Heat Location Lumbar Spine     Manual Therapy   Manual  Therapy Myofascial release   Soft tissue mobilization L QL in sidelying, right upper trap, levator, passive upper trap and levator stretch   Myofascial Release fascial rolling/ stretching of the R lower lumbar paraspinals     Neck Exercises: Stretches   Upper Trapezius Stretch 2 reps;30 seconds                  PT Short Term Goals - 02/22/16 1756      PT SHORT TERM GOAL #1   Title pt will be I with inital HEP (03/13/2016)   Time 4   Period Weeks   Status On-going     PT SHORT TERM GOAL #2   Title she will be able to verbalize and demo proper posture and lifting/ carrying mechanics to prevent and reduce neck and low back pain (03/13/2016)   Time 4   Period Weeks   Status On-going     PT SHORT TERM GOAL #3   Title she will demonstrate reduce muscle spasm in bil upper traps and R quadratus lumborum to decrease pain to </= 6/10 in neck /back and improve cervical and trunk mobility (03/12/2016)   Time 4   Period Weeks   Status On-going     PT SHORT TERM GOAL #4   Period Weeks   Status On-going     PT SHORT TERM GOAL #5   Title pt will improve lumbar flexion/ extension by >/= 5 degrees and R rotation by to >/= 15% to assist with function with </= 5/10 pain (03/12/2016)   Time 4   Period Weeks   Status On-going     PT SHORT TERM GOAL #6   Title assess Berg balance and make goals accordingly   Time 2   Period Weeks   Status Achieved           PT Long Term Goals - 02/22/16 1757      PT LONG TERM GOAL #1   Title pt will be I with all HEP given as of last visit (04/07/2016)   Time 8   Period Weeks   Status On-going     PT LONG TERM GOAL #2   Title pt will increase cervical flexion / rotation by >/= 15 degrees with </= 2/10 pain to assist with safety during driving and ADLS (60/45/4098)   Period Weeks   Status On-going     PT LONG TERM GOAL #3   Title pt will increase bil LE strength to >/= 4/5 with </= 2/10 pain for prolong walking/ standing ADLS and promote  safety (04/07/2016)   Time 8   Period Weeks   Status On-going     PT LONG TERM GOAL #4   Title pt will be able to walk/ stand for >/=30 min with </= 2/10 pain utilizing </= LRAD for safety to promote functional endurance required or ADLs (04/07/2016)   Time 8   Period Weeks   Status On-going     PT LONG TERM GOAL #5   Title pt will increase her FOTO score to </=55% limited to demonstrate improvement in function at discharge (04/07/2016)   Time 8   Period Weeks   Status Unable to assess     Additional Long Term Goals   Additional Long Term Goals Yes     PT LONG TERM GOAL #6   Title She will increase Her BERG balance by >/= 50/56 for safety during walking/ standing activities with LRAD    Time 7   Period Weeks   Status New               Plan - 02/22/16 1755    Clinical Impression Statement Mrs. Livolsi reports decreased tightness in the low back and shoulders today. She was able to do exercises given with no report of increased pain or tightness. continued MHP post session.    PT Next Visit Plan balance,  manual to calm down tightness, stretching, bil hip strengthening, posture education; pt has physioball and would like to know exercises.    Consulted and Agree with Plan of Care Patient      Patient will benefit from skilled therapeutic intervention in order to improve the following deficits and impairments:  Abnormal gait, Decreased activity tolerance, Decreased balance, Decreased endurance, Pain, Hypomobility, Decreased strength, Increased fascial restricitons, Difficulty walking, Decreased range of motion, Improper body mechanics, Postural dysfunction, Decreased mobility  Visit Diagnosis: Chronic bilateral low back pain with right-sided sciatica  Muscle weakness (generalized)  Cervicalgia  Other abnormalities of gait and mobility  Unsteadiness on feet     Problem List Patient Active Problem List   Diagnosis Date Noted  . Chronic constipation 06/23/2015   . Obesity 06/12/2015  . Chronic diastolic heart failure ([place]) 11/17/2014  . HNP (herniated nucleus pulposus), lumbar 08/22/2014    Class: Chronic  . Herniated nucleus pulposus, L2-3 right 02/04/2014    Class: Acute  . Urinary retention 05/04/2012    Class: Temporary  . Spondylolisthesis of lumbar region 04/30/2012    Class: Chronic  . Lumbar stenosis with neurogenic claudication 04/30/2012    Class: Chronic  . Chest pain 03/20/2012  . Right shoulder pain 12/07/2010  . ABDOMINAL PAIN, LOWER 09/21/2009  . BACK PAIN 03/18/2009  . SYNCOPE-CAROTID SINUS 09/16/2008  . Essential hypertension 08/05/2008  . Hypothyroidism 08/23/2006  . Diabetes with neurologic complications ([place]) 93/26/7124  . Diabetic neuropathy ([place]) 08/23/2006  . CARDIOMYOPATHY 08/23/2006  . GERD 08/23/2006  . FATTY LIVER DISEASE 08/23/2006  . OSTEOARTHRITIS 08/23/2006  . [place] DISEASE, CERVICAL 08/23/2006  . [place] DISEASE, LUMBAR 08/23/2006  . Headache(784.0) 08/23/2006  . BREAST CANCER, HX OF 08/23/2006  . HYPERPARATHYROIDISM, HX OF 08/23/2006  . RENAL CALCULUS, HX OF 08/23/2006    Starr Lake PT, DPT, LAT, ATC  02/22/16  6:00 PM      [facility] [address] [place], [place], [address] Phone: [phone]   Fax:  [phone]  Name: [name] MRN: [id number] Date of Birth: [birth year]

## 2016-02-24 ENCOUNTER — Ambulatory Visit: Payer: Commercial Managed Care - HMO | Admitting: Physical Therapy

## 2016-02-24 DIAGNOSIS — M542 Cervicalgia: Secondary | ICD-10-CM | POA: Diagnosis not present

## 2016-02-24 DIAGNOSIS — M6281 Muscle weakness (generalized): Secondary | ICD-10-CM | POA: Diagnosis not present

## 2016-02-24 DIAGNOSIS — M5441 Lumbago with sciatica, right side: Principal | ICD-10-CM

## 2016-02-24 DIAGNOSIS — R2689 Other abnormalities of gait and mobility: Secondary | ICD-10-CM

## 2016-02-24 DIAGNOSIS — R2681 Unsteadiness on feet: Secondary | ICD-10-CM | POA: Diagnosis not present

## 2016-02-24 DIAGNOSIS — G8929 Other chronic pain: Secondary | ICD-10-CM

## 2016-02-24 NOTE — Therapy (Signed)
[place], [place], [address] Phone: [phone]   Fax:  [phone]  Physical Therapy Treatment  Patient Details  Name: [name] MRN: [id number] Date of Birth: 13-Feb-1951 Referring Provider: Serafina Mitchell MD  Encounter Date: 02/24/2016      PT End of Session - 02/24/16 6283    Visit Number 4   Number of Visits 17   Date for PT Re-Evaluation 04/07/16   Authorization Type Medicare: Kx mod by 15th visit:  progress note by 10th visit   PT Start Time 1147   PT Stop Time 1239   PT Time Calculation (min) 52 min   Activity Tolerance Patient tolerated treatment well   Behavior During Therapy [facility] for tasks assessed/performed      Past Medical History:  Diagnosis Date  . Anginal pain ([place]) 03/19/2012   saw Dr. Cathie Olden .. she thinks its more related to stomach issues  . Arthritis    "all over; mostly in my back" (03/20/2012)  . Breast cancer ([place])    "right" (03/20/2012)  . Cardiomyopathy   . Chest pain 06/2005   Hospitalized, dystolic dysfunction  . Chronic lower back pain    "have 2 herniated discs; going to have to have a fusion" (03/20/2012)  . Family history of anesthesia complication    "father has nausea and vomiting"  . GERD (gastroesophageal reflux disease)   . H/O hiatal hernia   . History of kidney stones   . History of right mastectomy   . Hypertension    "patient states never had HTN, takes Hyzaar for heart  . Hypothyroidism   . Irritable bowel syndrome   . Migraines    "it's been a long time since I've had one" (03/20/2012)  . Osteoarthritis   . Peripheral neuropathy ([place])   . PONV (postoperative nausea and vomiting)   . Sciatica   . Type II diabetes mellitus ([place])     Past Surgical History:  Procedure Laterality Date  . ABDOMINAL HYSTERECTOMY  1993  . adenosin cardiolite  07/2005   (+) wall motion abnormality  . AXILLARY LYMPH NODE DISSECTION  2003   squamous cell  cancer  . BACK SURGERY    . CARDIAC CATHETERIZATION  ? 2005 / 2007  . CT abdomen and pelvis  02/2010   Same  . ESOPHAGOGASTRODUODENOSCOPY  2005   GERD  . KNEE ARTHROSCOPY  1990's   "right" (03/20/2012)  . LUMBAR FUSION N/A 08/22/2014   Procedure: Right L2-3 and right L1-2 transforaminal lumbar interbody fusion with pedicle screws, rods, sleeves, cages, local bone graft, Vivigen, cancellous chips;  Surgeon: Jessy Oto, MD;  Location: [facility];  Service: Orthopedics;  Laterality: N/A;  . LUMBAR LAMINECTOMY/DECOMPRESSION MICRODISCECTOMY N/A 02/04/2014   Procedure: RIGHT L2-3 MICRODISCECTOMY ;  Surgeon: Jessy Oto, MD;  Location: [facility];  Service: Orthopedics;  Laterality: N/A;  . MASTECTOMY WITH AXILLARY LYMPH NODE DISSECTION  12/2003   "right" (03/20/2012)  . POSTERIOR CERVICAL FUSION/FORAMINOTOMY  2001  . SHOULDER ARTHROSCOPY W/ ROTATOR CUFF REPAIR  2003   Left  . THYROIDECTOMY  1977   Right  . TOTAL KNEE ARTHROPLASTY  2000   Right  . [place] of abdomen  07/2005   Fatty liver / kidney stones    There were no vitals filed for this visit.      Subjective Assessment - 02/24/16 1150    Subjective "I am feeling pretty good today, most of the soreness is in the neck  today"    Currently in Pain? Yes   Pain Score 5    Pain Orientation Right;Left   Pain Descriptors / Indicators Aching   Pain Type Other (Comment)   Pain Onset More than a month ago   Pain Frequency Intermittent   Pain Score 2   Pain Location Back   Pain Type Chronic pain   Pain Frequency Constant                         [facility] Adult PT Treatment/Exercise - 02/24/16 0001      Neck Exercises: Supine   Neck Retraction 5 reps;10 secs     Lumbar Exercises: Stretches   Lower Trunk Rotation 3 reps;20 seconds     Lumbar Exercises: Aerobic   Stationary Bike Nustep L4 x 8 minutes for endurance (placed spacer medial to heel to promote better alignment)  using UE/LE     Moist Heat Therapy   Number  Minutes Moist Heat 10 Minutes   Moist Heat Location Cervical     Manual Therapy   Manual therapy comments manual trigger point release in the R upper trap x 2/ levator scap x 2, scalenes x 2   Myofascial Release fascial rolling/ stretching of the R upper trap/ levator scap and Scalenes     Neck Exercises: Stretches   Upper Trapezius Stretch 2 reps;30 seconds   Levator Stretch 2 reps;30 seconds          Trigger Point Dry Needling - 02/24/16 1237    Consent Given? Yes   Education Handout Provided Yes   Muscles Treated Upper Body Upper trapezius   Upper Trapezius Response Twitch reponse elicited;Palpable increased muscle length              PT Education - 02/24/16 1240    Education Details anatomy of the muscles in regard to formation of trigger points, referral of muscle pain and benefits of DN, what to expect and after care. DN handout provided.    Person(s) Educated Patient   Methods Explanation;Verbal cues;Handout   Comprehension Verbalized understanding;Verbal cues required          PT Short Term Goals - 02/22/16 1756      PT SHORT TERM GOAL #1   Title pt will be I with inital HEP (03/13/2016)   Time 4   Period Weeks   Status On-going     PT SHORT TERM GOAL #2   Title she will be able to verbalize and demo proper posture and lifting/ carrying mechanics to prevent and reduce neck and low back pain (03/13/2016)   Time 4   Period Weeks   Status On-going     PT SHORT TERM GOAL #3   Title she will demonstrate reduce muscle spasm in bil upper traps and R quadratus lumborum to decrease pain to </= 6/10 in neck /back and improve cervical and trunk mobility (03/12/2016)   Time 4   Period Weeks   Status On-going     PT SHORT TERM GOAL #4   Period Weeks   Status On-going     PT SHORT TERM GOAL #5   Title pt will improve lumbar flexion/ extension by >/= 5 degrees and R rotation by to >/= 15% to assist with function with </= 5/10 pain (03/12/2016)   Time 4   Period  Weeks   Status On-going     PT SHORT TERM GOAL #6   Title assess Berg balance and make goals  accordingly   Time 2   Period Weeks   Status Achieved           PT Long Term Goals - 02/22/16 1757      PT LONG TERM GOAL #1   Title pt will be I with all HEP given as of last visit (04/07/2016)   Time 8   Period Weeks   Status On-going     PT LONG TERM GOAL #2   Title pt will increase cervical flexion / rotation by >/= 15 degrees with </= 2/10 pain to assist with safety during driving and ADLS (16/94/5038)   Period Weeks   Status On-going     PT LONG TERM GOAL #3   Title pt will increase bil LE strength to >/= 4/5 with </= 2/10 pain for prolong walking/ standing ADLS and promote safety (04/07/2016)   Time 8   Period Weeks   Status On-going     PT LONG TERM GOAL #4   Title pt will be able to walk/ stand for >/=30 min with </= 2/10 pain utilizing </= LRAD for safety to promote functional endurance required or ADLs (04/07/2016)   Time 8   Period Weeks   Status On-going     PT LONG TERM GOAL #5   Title pt will increase her FOTO score to </=55% limited to demonstrate improvement in function at discharge (04/07/2016)   Time 8   Period Weeks   Status Unable to assess     Additional Long Term Goals   Additional Long Term Goals Yes     PT LONG TERM GOAL #6   Title She will increase Her BERG balance by >/= 50/56 for safety during walking/ standing activities with LRAD    Time 7   Period Weeks   Status New               Plan - 02/24/16 1242    Clinical Impression Statement Mrs. Arlen reports back still has 2/10 pain but has increased soreness in the neck today. focused on the neck today with manual techniques. DN was performed on the R upper trap followed with IASTM and myofascial techniques. continued MHP to calm down soreness from DN and decrease muscle tension.    PT Next Visit Plan balance,  manual to calm down tightness, stretching, bil hip strengthening, posture  education; pt has physioball and would like to know exercises.    PT Home Exercise Plan clam shells, upper trap stretch, chin tucks (in supine), QL stretching, pelvic tilts,    Consulted and Agree with Plan of Care Patient      Patient will benefit from skilled therapeutic intervention in order to improve the following deficits and impairments:  Abnormal gait, Decreased activity tolerance, Decreased balance, Decreased endurance, Pain, Hypomobility, Decreased strength, Increased fascial restricitons, Difficulty walking, Decreased range of motion, Improper body mechanics, Postural dysfunction, Decreased mobility  Visit Diagnosis: Chronic bilateral low back pain with right-sided sciatica  Muscle weakness (generalized)  Cervicalgia  Other abnormalities of gait and mobility  Unsteadiness on feet     Problem List Patient Active Problem List   Diagnosis Date Noted  . Chronic constipation 06/23/2015  . Obesity 06/12/2015  . Chronic diastolic heart failure ([place]) 11/17/2014  . HNP (herniated nucleus pulposus), lumbar 08/22/2014    Class: Chronic  . Herniated nucleus pulposus, L2-3 right 02/04/2014    Class: Acute  . Urinary retention 05/04/2012    Class: Temporary  . Spondylolisthesis of lumbar region 04/30/2012  Class: Chronic  . Lumbar stenosis with neurogenic claudication 04/30/2012    Class: Chronic  . Chest pain 03/20/2012  . Right shoulder pain 12/07/2010  . ABDOMINAL PAIN, LOWER 09/21/2009  . BACK PAIN 03/18/2009  . SYNCOPE-CAROTID SINUS 09/16/2008  . Essential hypertension 08/05/2008  . Hypothyroidism 08/23/2006  . Diabetes with neurologic complications ([place]) 26/71/2458  . Diabetic neuropathy ([place]) 08/23/2006  . CARDIOMYOPATHY 08/23/2006  . GERD 08/23/2006  . FATTY LIVER DISEASE 08/23/2006  . OSTEOARTHRITIS 08/23/2006  . [place] DISEASE, CERVICAL 08/23/2006  . [place] DISEASE, LUMBAR 08/23/2006  . Headache(784.0) 08/23/2006  . BREAST CANCER, HX OF 08/23/2006  .  HYPERPARATHYROIDISM, HX OF 08/23/2006  . RENAL CALCULUS, HX OF 08/23/2006   Starr Lake PT, DPT, LAT, ATC  02/24/16  12:52 PM      [facility] [address] [place], [place], [address] Phone: [phone]   Fax:  [phone]  Name: LAQUESHA HOLCOMB MRN: [id number] Date of Birth: 08/22/1950

## 2016-02-26 ENCOUNTER — Ambulatory Visit: Payer: Commercial Managed Care - HMO | Admitting: Physical Therapy

## 2016-02-26 ENCOUNTER — Ambulatory Visit (INDEPENDENT_AMBULATORY_CARE_PROVIDER_SITE_OTHER): Payer: Commercial Managed Care - HMO | Admitting: Physical Medicine and Rehabilitation

## 2016-02-26 ENCOUNTER — Encounter (INDEPENDENT_AMBULATORY_CARE_PROVIDER_SITE_OTHER): Payer: Self-pay | Admitting: Physical Medicine and Rehabilitation

## 2016-02-26 DIAGNOSIS — R2681 Unsteadiness on feet: Secondary | ICD-10-CM | POA: Diagnosis not present

## 2016-02-26 DIAGNOSIS — R202 Paresthesia of skin: Secondary | ICD-10-CM | POA: Diagnosis not present

## 2016-02-26 DIAGNOSIS — M5441 Lumbago with sciatica, right side: Principal | ICD-10-CM

## 2016-02-26 DIAGNOSIS — M542 Cervicalgia: Secondary | ICD-10-CM

## 2016-02-26 DIAGNOSIS — G8929 Other chronic pain: Secondary | ICD-10-CM

## 2016-02-26 DIAGNOSIS — M6281 Muscle weakness (generalized): Secondary | ICD-10-CM

## 2016-02-26 DIAGNOSIS — R2689 Other abnormalities of gait and mobility: Secondary | ICD-10-CM | POA: Diagnosis not present

## 2016-02-26 NOTE — Procedures (Signed)
EMG & NCV Findings: Evaluation of the right median motor nerve showed prolonged distal onset latency (4.3 ms) and decreased conduction velocity (Elbow-Wrist, 45 m/s).  The right median (across palm) sensory nerve showed prolonged distal peak latency (Wrist, 5.0 ms), reduced amplitude (4.9 V), and prolonged distal peak latency (Palm, 2.9 ms).  The right ulnar sensory nerve showed reduced amplitude (11.1 V).  All remaining nerves (as indicated in the following tables) were within normal limits.    Needle evaluation of the right anterior tibialis muscle showed increased insertional activity and diminished recruitment.  The right Fibularis Longus muscle showed slightly increased spontaneous activity and diminished recruitment.  The right vastus medialis muscle showed increased insertional activity.  All remaining muscles (as indicated in the following table) showed no evidence of electrical instability.    Impression: The above electrodiagnostic study is ABNORMAL and reveals evidence of a moderate right median nerve entrapment at the wrist (carpal tunnel syndrome) affecting sensory and motor components. There is also evidence of mild chronic L4 and L5 radiculopathy on the right.  There is no evidence of active radiculopathy.  There is no significant electrodiagnostic evidence of any other focal nerve entrapment or generalized peripheral neuropathy in the right upper extremity.  Recommendations: 1.  Follow-up with referring physician. 2.  Continue current management of symptoms. 3.  Suggest use of resting splint at night-time and as needed during the day.    Nerve Conduction Studies Anti Sensory Summary Table   Stim Site NR Peak (ms) Norm Peak (ms) P-T Amp (V) Norm P-T Amp Site1 Site2 Delta-P (ms) Dist (cm) Vel (m/s) Norm Vel (m/s)  Right Median Acr Palm Anti Sensory (2nd Digit)  33.1C  Wrist    *5.0 <3.6 *4.9 >10 Wrist Palm 2.1 0.0    Palm    *2.9 <2.0 4.6         Right Radial Anti Sensory  (Base 1st Digit)  32.2C  Wrist    2.3 <3.1 9.4  Wrist Base 1st Digit 2.3 0.0    Right Ulnar Anti Sensory (5th Digit)  32.9C  Wrist    3.5 <3.7 *11.1 >15.0 Wrist 5th Digit 3.5 14.0 40 >38   Motor Summary Table   Stim Site NR Onset (ms) Norm Onset (ms) O-P Amp (mV) Norm O-P Amp Site1 Site2 Delta-0 (ms) Dist (cm) Vel (m/s) Norm Vel (m/s)  Right Median Motor (Abd Poll Brev)  32.4C  Wrist    *4.3 <4.2 5.8 >5 Elbow Wrist 5.2 23.5 *45 >50  Elbow    9.5  3.6         Right Ulnar Motor (Abd Dig Min)  32.3C  Wrist    3.5 <4.2 4.8 >3 B Elbow Wrist 3.8 20.0 53 >53  B Elbow    7.3  4.7  A Elbow B Elbow 1.4 9.0 64 >53  A Elbow    8.7  4.5          EMG   Side Muscle Nerve Root Ins Act Fibs Psw Amp Dur Poly Recrt Int Fraser Din Comment  Right AntTibialis Dp Br Peron L4-5 *CRD Nml Nml Nml Nml 0 *Reduced Nml   Right Fibularis Longus  Sup Br Peron L5-S1 Nml *1+ *1+ Nml Nml 0 *Reduced Nml   Right MedGastroc Tibial S1-2 Nml Nml Nml Nml Nml 0 Nml Nml   Right VastusMed Femoral L2-4 *Incr Nml Nml Nml Nml 0 Nml Nml   Right BicepsFemS Sciatic L5-S1 Nml Nml Nml Nml Nml 0 Nml Nml  Nerve Conduction Studies Anti Sensory Left/Right Comparison   Stim Site L Lat (ms) R Lat (ms) L-R Lat (ms) L Amp (V) R Amp (V) L-R Amp (%) Site1 Site2 L Vel (m/s) R Vel (m/s) L-R Vel (m/s)  Median Acr Palm Anti Sensory (2nd Digit)  33.1C  Wrist  *5.0   *4.9  Wrist Palm     Palm  *2.9   4.6        Radial Anti Sensory (Base 1st Digit)  32.2C  Wrist  2.3   9.4  Wrist Base 1st Digit     Ulnar Anti Sensory (5th Digit)  32.9C  Wrist  3.5   *11.1  Wrist 5th Digit  40    Motor Left/Right Comparison   Stim Site L Lat (ms) R Lat (ms) L-R Lat (ms) L Amp (mV) R Amp (mV) L-R Amp (%) Site1 Site2 L Vel (m/s) R Vel (m/s) L-R Vel (m/s)  Median Motor (Abd Poll Brev)  32.4C  Wrist  *4.3   5.8  Elbow Wrist  *45   Elbow  9.5   3.6        Ulnar Motor (Abd Dig Min)  32.3C  Wrist  3.5   4.8  B Elbow Wrist  53   B Elbow  7.3   4.7  A Elbow  B Elbow  64   A Elbow  8.7   4.5

## 2016-02-26 NOTE — Therapy (Signed)
[place], [place], [address] Phone: [phone]   Fax:  [phone]  Physical Therapy Treatment  Patient Details  Name: [name] MRN: [id number] Date of Birth: 12-11-1950 Referring Provider: Serafina Mitchell MD  Encounter Date: 02/26/2016      PT End of Session - 02/26/16 9476    Visit Number 5   Number of Visits 17   Date for PT Re-Evaluation 04/07/16   Authorization Type Medicare: Kx mod by 15th visit:  progress note by 10th visit   PT Start Time 1147   PT Stop Time 1230   PT Time Calculation (min) 43 min      Past Medical History:  Diagnosis Date  . Anginal pain ([facility]) 03/19/2012   saw Dr. Cathie Olden .. she thinks its more related to stomach issues  . Arthritis    "all over; mostly in my back" (03/20/2012)  . Breast cancer ([place])    "right" (03/20/2012)  . Cardiomyopathy   . Chest pain 06/2005   Hospitalized, dystolic dysfunction  . Chronic lower back pain    "have 2 herniated discs; going to have to have a fusion" (03/20/2012)  . Family history of anesthesia complication    "father has nausea and vomiting"  . GERD (gastroesophageal reflux disease)   . H/O hiatal hernia   . History of kidney stones   . History of right mastectomy   . Hypertension    "patient states never had HTN, takes Hyzaar for heart  . Hypothyroidism   . Irritable bowel syndrome   . Migraines    "it's been a long time since I've had one" (03/20/2012)  . Osteoarthritis   . Peripheral neuropathy ([facility])   . PONV (postoperative nausea and vomiting)   . Sciatica   . Type II diabetes mellitus ([facility])     Past Surgical History:  Procedure Laterality Date  . ABDOMINAL HYSTERECTOMY  1993  . adenosin cardiolite  07/2005   (+) wall motion abnormality  . AXILLARY LYMPH NODE DISSECTION  2003   squamous cell cancer  . BACK SURGERY    . CARDIAC CATHETERIZATION  ? 2005 / 2007  . CT abdomen and pelvis  02/2010   Same  .  ESOPHAGOGASTRODUODENOSCOPY  2005   GERD  . KNEE ARTHROSCOPY  1990's   "right" (03/20/2012)  . LUMBAR FUSION N/A 08/22/2014   Procedure: Right L2-3 and right L1-2 transforaminal lumbar interbody fusion with pedicle screws, rods, sleeves, cages, local bone graft, Vivigen, cancellous chips;  Surgeon: Jessy Oto, MD;  Location: [facility];  Service: Orthopedics;  Laterality: N/A;  . LUMBAR LAMINECTOMY/DECOMPRESSION MICRODISCECTOMY N/A 02/04/2014   Procedure: RIGHT L2-3 MICRODISCECTOMY ;  Surgeon: Jessy Oto, MD;  Location: [facility];  Service: Orthopedics;  Laterality: N/A;  . MASTECTOMY WITH AXILLARY LYMPH NODE DISSECTION  12/2003   "right" (03/20/2012)  . POSTERIOR CERVICAL FUSION/FORAMINOTOMY  2001  . SHOULDER ARTHROSCOPY W/ ROTATOR CUFF REPAIR  2003   Left  . THYROIDECTOMY  1977   Right  . TOTAL KNEE ARTHROPLASTY  2000   Right  . [place] of abdomen  07/2005   Fatty liver / kidney stones    There were no vitals filed for this visit.      Subjective Assessment - 02/26/16 1149    Subjective I just had a nerve test. I have moderate nerve damage in my right leg.    Currently in Pain? Yes   Pain Score 1    Pain  Location Neck   Pain Score 5   Pain Location Back   Pain Orientation Mid;Lower;Right   Pain Radiating Towards Referral to RLE into the toes   Aggravating Factors  prolonged standing    Pain Relieving Factors tramadol, heat, keep moving                         [facility] Adult PT Treatment/Exercise - 02/26/16 0001      Lumbar Exercises: Standing   Other Standing Lumbar Exercises standing rhomboid/ back stretch 3 x 20 sec.- at sink worked best.      Charity fundraiser Therapy   Number Minutes Moist Heat 15 Minutes   Moist Heat Location Lumbar Spine     Electrical Stimulation   Electrical Stimulation Location Lumbar   Electrical Stimulation Action IFC x 15 minutes   Electrical Stimulation Parameters 39ma   Electrical  Stimulation Goals Pain     Manual Therapy   Myofascial Release fascial rolling/stretching of lumbar paraspinals as well as IASTM           Trigger Point Dry Needling - 02/26/16 1209    Consent Given? Yes   Education Handout Provided --  given previously   Muscles Treated Upper Body Longissimus   Longissimus Response Twitch response elicited;Palpable increased muscle length  R lumbar multifidus region    The above listed Dry Needling was performed by Carlus Pavlov PT, DPT             PT Short Term Goals - 02/22/16 1756      PT SHORT TERM GOAL #1   Title pt will be I with inital HEP (03/13/2016)   Time 4   Period Weeks   Status On-going     PT SHORT TERM GOAL #2   Title she will be able to verbalize and demo proper posture and lifting/ carrying mechanics to prevent and reduce neck and low back pain (03/13/2016)   Time 4   Period Weeks   Status On-going     PT SHORT TERM GOAL #3   Title she will demonstrate reduce muscle spasm in bil upper traps and R quadratus lumborum to decrease pain to </= 6/10 in neck /back and improve cervical and trunk mobility (03/12/2016)   Time 4   Period Weeks   Status On-going     PT SHORT TERM GOAL #4   Period Weeks   Status On-going     PT SHORT TERM GOAL #5   Title pt will improve lumbar flexion/ extension by >/= 5 degrees and R rotation by to >/= 15% to assist with function with </= 5/10 pain (03/12/2016)   Time 4   Period Weeks   Status On-going     PT SHORT TERM GOAL #6   Title assess Berg balance and make goals accordingly   Time 2   Period Weeks   Status Achieved           PT Long Term Goals - 02/22/16 1757      PT LONG TERM GOAL #1   Title pt will be I with all HEP given as of last visit (04/07/2016)   Time 8   Period Weeks   Status On-going     PT LONG TERM GOAL #2   Title pt will increase cervical flexion / rotation by >/= 15 degrees with </= 2/10 pain to assist with safety during driving and ADLS (68/34/1962)  Period Weeks   Status On-going     PT LONG TERM GOAL #3   Title pt will increase bil LE strength to >/= 4/5 with </= 2/10 pain for prolong walking/ standing ADLS and promote safety (04/07/2016)   Time 8   Period Weeks   Status On-going     PT LONG TERM GOAL #4   Title pt will be able to walk/ stand for >/=30 min with </= 2/10 pain utilizing </= LRAD for safety to promote functional endurance required or ADLs (04/07/2016)   Time 8   Period Weeks   Status On-going     PT LONG TERM GOAL #5   Title pt will increase her FOTO score to </=55% limited to demonstrate improvement in function at discharge (04/07/2016)   Time 8   Period Weeks   Status Unable to assess     Additional Long Term Goals   Additional Long Term Goals Yes     PT LONG TERM GOAL #6   Title She will increase Her BERG balance by >/= 50/56 for safety during walking/ standing activities with LRAD    Time 7   Period Weeks   Status New               Plan - 02/26/16 1241    Clinical Impression Statement Pt reports neck pain nearly resolved after DN last treatment. She would like to try DN on her lower back. Carlus Pavlov PT. DPT performed DN on right lumbar multifidus and PTA follwed with IFC, IASTM and myofascial techniques. Continued HMP to calm down soreness from DN and decrease muscle tension. Instructed pt in back stretch at pole and then sink for home. Encouraged her to use heat to lumbar spine arnd reminded her she may be sore. Pt reports feeling a little "woozy" at end of treatment. She denied to sit, rest, or have water. Her husband was present at end of session and she left with him.    PT Next Visit Plan Assess benefit of lumbar DN; balance,  manual to calm down tightness, stretching, bil hip strengthening, posture education; pt has physioball and would like to know exercises.    PT Home Exercise Plan clam shells, upper trap stretch, chin tucks (in supine), QL stretching, pelvic tilts,       Patient will  benefit from skilled therapeutic intervention in order to improve the following deficits and impairments:  Abnormal gait, Decreased activity tolerance, Decreased balance, Decreased endurance, Pain, Hypomobility, Decreased strength, Increased fascial restricitons, Difficulty walking, Decreased range of motion, Improper body mechanics, Postural dysfunction, Decreased mobility  Visit Diagnosis: No diagnosis found.     Problem List Patient Active Problem List   Diagnosis Date Noted  . Chronic constipation 06/23/2015  . Obesity 06/12/2015  . Chronic diastolic heart failure ([facility]) 11/17/2014  . HNP (herniated nucleus pulposus), lumbar 08/22/2014    Class: Chronic  . Herniated nucleus pulposus, L2-3 right 02/04/2014    Class: Acute  . Urinary retention 05/04/2012    Class: Temporary  . Spondylolisthesis of lumbar region 04/30/2012    Class: Chronic  . Lumbar stenosis with neurogenic claudication 04/30/2012    Class: Chronic  . Chest pain 03/20/2012  . Right shoulder pain 12/07/2010  . ABDOMINAL PAIN, LOWER 09/21/2009  . BACK PAIN 03/18/2009  . SYNCOPE-CAROTID SINUS 09/16/2008  . Essential hypertension 08/05/2008  . Hypothyroidism 08/23/2006  . Diabetes with neurologic complications ([facility]) 95/63/8756  . Diabetic neuropathy ([facility]) 08/23/2006  . CARDIOMYOPATHY 08/23/2006  . GERD 08/23/2006  .  FATTY LIVER DISEASE 08/23/2006  . OSTEOARTHRITIS 08/23/2006  . [place] DISEASE, CERVICAL 08/23/2006  . [place] DISEASE, LUMBAR 08/23/2006  . Headache(784.0) 08/23/2006  . BREAST CANCER, HX OF 08/23/2006  . HYPERPARATHYROIDISM, HX OF 08/23/2006  . RENAL CALCULUS, HX OF 08/23/2006    Dorene Ar, PTA 02/26/2016, 12:45 PM  [place], [place], [address] Phone: [phone]   Fax:  [phone]  Name: [name] MRN: [id number] Date of Birth: Apr 14, 1950

## 2016-02-26 NOTE — Progress Notes (Signed)
[name] - 65 y.o. female MRN [id number]  Date of birth: 07-Mar-1951  Office Visit Note: Visit Date: 02/26/2016 PCP: Odette Fraction, MD Referred by: Susy Frizzle, MD  Subjective: Chief Complaint  Patient presents with  . Right Hand - Pain, Weakness  . Right Arm - Pain, Weakness  . Right Leg - Weakness, Pain, Numbness  . Right Foot - Pain, Weakness, Numbness   HPI: [name] is a 65 year old female here today for right arm and leg nerve study. She is a long-term patient of Dr. Louanne Skye. He specifically requested right upper extremity electrodiagnostic study to rule out anything not related to the cervical spine. She is having numbness tingling in the medial portion of the hands she has pain in the shoulder as well. She does have worsening symptoms at night and with driving. Her leg pain on the right is with her back pain. She has difficulty ambulating at times. We have seen her on a few occasions for lumbar spine injections which have been temporarily beneficial. She has had prior spine surgery. She has not had prior electrodiagnostic studies.   Weakness  The current episode started more than 1 month ago. The problem occurs constantly. The problem has been unchanged. Associated symptoms include weakness.    Review of Systems  Neurological: Positive for weakness.   Otherwise per HPI.  Assessment & Plan: Visit Diagnoses:  1. Paresthesia of skin   2. Chronic right-sided low back pain with right-sided sciatica   3. Cervicalgia     Plan: Findings:  Please see the specific assessment below but there was moderate median neuropathy at the wrist on the right as well as chronic L5 and possibly L4 radiculopathy. There was no active radiculopathy.    Meds & Orders: No orders of the defined types were placed in this encounter.   Orders Placed This Encounter  Procedures  . NCV with EMG (electromyography)    Follow-up: Return for Dr. Louanne Skye has regularly scheduled.    Procedures: No procedures performed  EMG & NCV Findings: Evaluation of the right median motor nerve showed prolonged distal onset latency (4.3 ms) and decreased conduction velocity (Elbow-Wrist, 45 m/s).  The right median (across palm) sensory nerve showed prolonged distal peak latency (Wrist, 5.0 ms), reduced amplitude (4.9 V), and prolonged distal peak latency (Palm, 2.9 ms).  The right ulnar sensory nerve showed reduced amplitude (11.1 V).  All remaining nerves (as indicated in the following tables) were within normal limits.    Needle evaluation of the right anterior tibialis muscle showed increased insertional activity and diminished recruitment.  The right Fibularis Longus muscle showed slightly increased spontaneous activity and diminished recruitment.  The right vastus medialis muscle showed increased insertional activity.  All remaining muscles (as indicated in the following table) showed no evidence of electrical instability.    Impression: The above electrodiagnostic study is ABNORMAL and reveals evidence of a moderate right median nerve entrapment at the wrist (carpal tunnel syndrome) affecting sensory and motor components. There is also evidence of mild chronic L4 and L5 radiculopathy on the right.  There is no evidence of active radiculopathy.  There is no significant electrodiagnostic evidence of any other focal nerve entrapment or generalized peripheral neuropathy in the right upper extremity.  Recommendations: 1.  Follow-up with referring physician. 2.  Continue current management of symptoms. 3.  Suggest use of resting splint at night-time and as needed during the day.    Nerve Conduction Studies Anti Sensory Summary Table  Stim Site NR Peak (ms) Norm Peak (ms) P-T Amp (V) Norm P-T Amp Site1 Site2 Delta-P (ms) Dist (cm) Vel (m/s) Norm Vel (m/s)  Right Median Acr Palm Anti Sensory (2nd Digit)  33.1C  Wrist    *5.0 <3.6 *4.9 >10 Wrist Palm 2.1 0.0    Palm    *2.9  <2.0 4.6         Right Radial Anti Sensory (Base 1st Digit)  32.2C  Wrist    2.3 <3.1 9.4  Wrist Base 1st Digit 2.3 0.0    Right Ulnar Anti Sensory (5th Digit)  32.9C  Wrist    3.5 <3.7 *11.1 >15.0 Wrist 5th Digit 3.5 14.0 40 >38   Motor Summary Table   Stim Site NR Onset (ms) Norm Onset (ms) O-P Amp (mV) Norm O-P Amp Site1 Site2 Delta-0 (ms) Dist (cm) Vel (m/s) Norm Vel (m/s)  Right Median Motor (Abd Poll Brev)  32.4C  Wrist    *4.3 <4.2 5.8 >5 Elbow Wrist 5.2 23.5 *45 >50  Elbow    9.5  3.6         Right Ulnar Motor (Abd Dig Min)  32.3C  Wrist    3.5 <4.2 4.8 >3 B Elbow Wrist 3.8 20.0 53 >53  B Elbow    7.3  4.7  A Elbow B Elbow 1.4 9.0 64 >53  A Elbow    8.7  4.5          EMG   Side Muscle Nerve Root Ins Act Fibs Psw Amp Dur Poly Recrt Int Fraser Din Comment  Right AntTibialis Dp Br Peron L4-5 *CRD Nml Nml Nml Nml 0 *Reduced Nml   Right Fibularis Longus  Sup Br Peron L5-S1 Nml *1+ *1+ Nml Nml 0 *Reduced Nml   Right MedGastroc Tibial S1-2 Nml Nml Nml Nml Nml 0 Nml Nml   Right VastusMed Femoral L2-4 *Incr Nml Nml Nml Nml 0 Nml Nml   Right BicepsFemS Sciatic L5-S1 Nml Nml Nml Nml Nml 0 Nml Nml     Nerve Conduction Studies Anti Sensory Left/Right Comparison   Stim Site L Lat (ms) R Lat (ms) L-R Lat (ms) L Amp (V) R Amp (V) L-R Amp (%) Site1 Site2 L Vel (m/s) R Vel (m/s) L-R Vel (m/s)  Median Acr Palm Anti Sensory (2nd Digit)  33.1C  Wrist  *5.0   *4.9  Wrist Palm     Palm  *2.9   4.6        Radial Anti Sensory (Base 1st Digit)  32.2C  Wrist  2.3   9.4  Wrist Base 1st Digit     Ulnar Anti Sensory (5th Digit)  32.9C  Wrist  3.5   *11.1  Wrist 5th Digit  40    Motor Left/Right Comparison   Stim Site L Lat (ms) R Lat (ms) L-R Lat (ms) L Amp (mV) R Amp (mV) L-R Amp (%) Site1 Site2 L Vel (m/s) R Vel (m/s) L-R Vel (m/s)  Median Motor (Abd Poll Brev)  32.4C  Wrist  *4.3   5.8  Elbow Wrist  *45   Elbow  9.5   3.6        Ulnar Motor (Abd Dig Min)  32.3C  Wrist  3.5   4.8  B  Elbow Wrist  53   B Elbow  7.3   4.7  A Elbow B Elbow  64   A Elbow  8.7   4.5  Clinical History: No specialty comments available.  She reports that she has never smoked. She has never used smokeless tobacco.   Recent Labs  05/27/15 0933  HGBA1C 11.7*    Objective:  VS:  HT:    WT:   BMI:     BP:   HR: bpm  TEMP: ( )  RESP:  Physical Exam  Constitutional: She is oriented to person, place, and time.  Musculoskeletal:  The patient has no atrophy in the FDI or APB musculature. There is some osteoarthritic changes. There is no swelling. No color changes. No allodynia.  Neurological: She is alert and oriented to person, place, and time. She displays no atrophy and no tremor. A sensory deficit is present.  She had mild decreased sensation to light touch in the median nerve distribution on the right. She had no other dermatomal findings in the arm or leg. She had a negative Hoffmann's test in the upper extremities and no clonus in the lower extremities.    Ortho Exam Imaging: No results found.  Past Medical/Family/Surgical/Social History: Medications & Allergies reviewed per EMR Patient Active Problem List   Diagnosis Date Noted  . Chronic constipation 06/23/2015  . Obesity 06/12/2015  . Chronic diastolic heart failure ([facility]) 11/17/2014  . HNP (herniated nucleus pulposus), lumbar 08/22/2014    Class: Chronic  . Herniated nucleus pulposus, L2-3 right 02/04/2014    Class: Acute  . Urinary retention 05/04/2012    Class: Temporary  . Spondylolisthesis of lumbar region 04/30/2012    Class: Chronic  . Lumbar stenosis with neurogenic claudication 04/30/2012    Class: Chronic  . Chest pain 03/20/2012  . Right shoulder pain 12/07/2010  . ABDOMINAL PAIN, LOWER 09/21/2009  . BACK PAIN 03/18/2009  . SYNCOPE-CAROTID SINUS 09/16/2008  . Essential hypertension 08/05/2008  . Hypothyroidism 08/23/2006  . Diabetes with neurologic complications ([facility]) 47/82/9562  .  Diabetic neuropathy ([facility]) 08/23/2006  . CARDIOMYOPATHY 08/23/2006  . GERD 08/23/2006  . FATTY LIVER DISEASE 08/23/2006  . OSTEOARTHRITIS 08/23/2006  . [place] DISEASE, CERVICAL 08/23/2006  . [place] DISEASE, LUMBAR 08/23/2006  . Headache(784.0) 08/23/2006  . BREAST CANCER, HX OF 08/23/2006  . HYPERPARATHYROIDISM, HX OF 08/23/2006  . RENAL CALCULUS, HX OF 08/23/2006   Past Medical History:  Diagnosis Date  . Anginal pain ([facility]) 03/19/2012   saw Dr. Cathie Olden .. she thinks its more related to stomach issues  . Arthritis    "all over; mostly in my back" (03/20/2012)  . Breast cancer ([facility])    "right" (03/20/2012)  . Cardiomyopathy   . Chest pain 06/2005   Hospitalized, dystolic dysfunction  . Chronic lower back pain    "have 2 herniated discs; going to have to have a fusion" (03/20/2012)  . Family history of anesthesia complication    "father has nausea and vomiting"  . GERD (gastroesophageal reflux disease)   . H/O hiatal hernia   . History of kidney stones   . History of right mastectomy   . Hypertension    "patient states never had HTN, takes Hyzaar for heart  . Hypothyroidism   . Irritable bowel syndrome   . Migraines    "it's been a long time since I've had one" (03/20/2012)  . Osteoarthritis   . Peripheral neuropathy ([facility])   . PONV (postoperative nausea and vomiting)   . Sciatica   . Type II diabetes mellitus ([facility])    Family History  Problem Relation Age of Onset  . Hypertension Mother   . Diabetes Father   .  Hypertension Father   . Hypertension Sister   . Hypertension Brother   . Hypertension Brother   . Diabetes Brother   . Diabetes Other     Family Hx. of Diabetes  . Hypertension Other     Family History of HTN   Past Surgical History:  Procedure Laterality Date  . ABDOMINAL HYSTERECTOMY  1993  . adenosin cardiolite  07/2005   (+) wall motion abnormality  . AXILLARY LYMPH NODE DISSECTION  2003   squamous cell cancer  . BACK SURGERY    . CARDIAC  CATHETERIZATION  ? 2005 / 2007  . CT abdomen and pelvis  02/2010   Same  . ESOPHAGOGASTRODUODENOSCOPY  2005   GERD  . KNEE ARTHROSCOPY  1990's   "right" (03/20/2012)  . LUMBAR FUSION N/A 08/22/2014   Procedure: Right L2-3 and right L1-2 transforaminal lumbar interbody fusion with pedicle screws, rods, sleeves, cages, local bone graft, Vivigen, cancellous chips;  Surgeon: Jessy Oto, MD;  Location: [facility];  Service: Orthopedics;  Laterality: N/A;  . LUMBAR LAMINECTOMY/DECOMPRESSION MICRODISCECTOMY N/A 02/04/2014   Procedure: RIGHT L2-3 MICRODISCECTOMY ;  Surgeon: Jessy Oto, MD;  Location: [facility];  Service: Orthopedics;  Laterality: N/A;  . MASTECTOMY WITH AXILLARY LYMPH NODE DISSECTION  12/2003   "right" (03/20/2012)  . POSTERIOR CERVICAL FUSION/FORAMINOTOMY  2001  . SHOULDER ARTHROSCOPY W/ ROTATOR CUFF REPAIR  2003   Left  . THYROIDECTOMY  1977   Right  . TOTAL KNEE ARTHROPLASTY  2000   Right  . [place] of abdomen  07/2005   Fatty liver / kidney stones   Social History   Occupational History  . Retired since 2003    Social History Main Topics  . Smoking status: Never Smoker  . Smokeless tobacco: Never Used  . Alcohol use No  . Drug use: No  . Sexual activity: Not on file

## 2016-02-29 ENCOUNTER — Ambulatory Visit: Payer: Commercial Managed Care - HMO | Admitting: Physical Therapy

## 2016-02-29 ENCOUNTER — Telehealth: Payer: Self-pay | Admitting: Family Medicine

## 2016-02-29 DIAGNOSIS — R2681 Unsteadiness on feet: Secondary | ICD-10-CM

## 2016-02-29 DIAGNOSIS — G8929 Other chronic pain: Secondary | ICD-10-CM | POA: Diagnosis not present

## 2016-02-29 DIAGNOSIS — M542 Cervicalgia: Secondary | ICD-10-CM | POA: Diagnosis not present

## 2016-02-29 DIAGNOSIS — M6281 Muscle weakness (generalized): Secondary | ICD-10-CM

## 2016-02-29 DIAGNOSIS — M5441 Lumbago with sciatica, right side: Principal | ICD-10-CM

## 2016-02-29 DIAGNOSIS — R2689 Other abnormalities of gait and mobility: Secondary | ICD-10-CM

## 2016-02-29 NOTE — Telephone Encounter (Signed)
Rec'd [employer] auth req by Dr Louanne Skye for Dr. Magnus Sinning for 6 visits 02/26/16 - 08/27/16  Dx R20.2 Parasthesia of skin

## 2016-02-29 NOTE — Patient Instructions (Addendum)
OK to use ball supine for bridges, low bach stretches, 1 x a day 3-10 X each Abdominal isometrics, 1 x a day 10 x each/  Both and single arms on ball.in hooklying.

## 2016-02-29 NOTE — Therapy (Signed)
[place], [place], [address] Phone: [phone]   Fax:  [phone]  Physical Therapy Treatment  Patient Details  Name: [name] MRN: [id number] Date of Birth: Aug 13, 1950 Referring Provider: Serafina Mitchell MD  Encounter Date: 02/29/2016      PT End of Session - 02/29/16 1529    Visit Number 6   Number of Visits 17   Date for PT Re-Evaluation 04/07/16   PT Start Time 6712   PT Stop Time 1525   PT Time Calculation (min) 68 min   Activity Tolerance Patient tolerated treatment well      Past Medical History:  Diagnosis Date  . Anginal pain ([place]) 03/19/2012   saw Dr. Cathie Olden .. she thinks its more related to stomach issues  . Arthritis    "all over; mostly in my back" (03/20/2012)  . Breast cancer ([place])    "right" (03/20/2012)  . Cardiomyopathy   . Chest pain 06/2005   Hospitalized, dystolic dysfunction  . Chronic lower back pain    "have 2 herniated discs; going to have to have a fusion" (03/20/2012)  . Family history of anesthesia complication    "father has nausea and vomiting"  . GERD (gastroesophageal reflux disease)   . H/O hiatal hernia   . History of kidney stones   . History of right mastectomy   . Hypertension    "patient states never had HTN, takes Hyzaar for heart  . Hypothyroidism   . Irritable bowel syndrome   . Migraines    "it's been a long time since I've had one" (03/20/2012)  . Osteoarthritis   . Peripheral neuropathy ([place])   . PONV (postoperative nausea and vomiting)   . Sciatica   . Type II diabetes mellitus ([place])     Past Surgical History:  Procedure Laterality Date  . ABDOMINAL HYSTERECTOMY  1993  . adenosin cardiolite  07/2005   (+) wall motion abnormality  . AXILLARY LYMPH NODE DISSECTION  2003   squamous cell cancer  . BACK SURGERY    . CARDIAC CATHETERIZATION  ? 2005 / 2007  . CT abdomen and pelvis  02/2010   Same  . ESOPHAGOGASTRODUODENOSCOPY   2005   GERD  . KNEE ARTHROSCOPY  1990's   "right" (03/20/2012)  . LUMBAR FUSION N/A 08/22/2014   Procedure: Right L2-3 and right L1-2 transforaminal lumbar interbody fusion with pedicle screws, rods, sleeves, cages, local bone graft, Vivigen, cancellous chips;  Surgeon: Jessy Oto, MD;  Location: [facility];  Service: Orthopedics;  Laterality: N/A;  . LUMBAR LAMINECTOMY/DECOMPRESSION MICRODISCECTOMY N/A 02/04/2014   Procedure: RIGHT L2-3 MICRODISCECTOMY ;  Surgeon: Jessy Oto, MD;  Location: [facility];  Service: Orthopedics;  Laterality: N/A;  . MASTECTOMY WITH AXILLARY LYMPH NODE DISSECTION  12/2003   "right" (03/20/2012)  . POSTERIOR CERVICAL FUSION/FORAMINOTOMY  2001  . SHOULDER ARTHROSCOPY W/ ROTATOR CUFF REPAIR  2003   Left  . THYROIDECTOMY  1977   Right  . TOTAL KNEE ARTHROPLASTY  2000   Right  . [place] of abdomen  07/2005   Fatty liver / kidney stones    There were no vitals filed for this visit.      Subjective Assessment - 02/29/16 1424    Subjective 4/10 low back  .  It helped a lot.  Neck bothered right lateral neck, ($/10) She did her stretches and it started to feel better.   Currently in Pain? Yes   Pain Location Neck  Pain Orientation Right;Left   Pain Descriptors / Indicators Aching   Pain Type Chronic pain   Pain Frequency Intermittent   Aggravating Factors  crochet ,  sitting without support   Pain Relieving Factors stretches,    Pain Score 4   Pain Location Back   Pain Orientation Lower   Pain Descriptors / Indicators Throbbing;Sore;Numbness   Pain Type Chronic pain   Aggravating Factors  the more I walk on it the worse it gets   Pain Relieving Factors prop foot up                         [facility] Adult PT Treatment/Exercise - 02/29/16 0001      Transfers   Comments supine to sit- needs cues to roll fully to her side prior to pushing up.      Neck Exercises: Supine   Neck Retraction 5 reps     Lumbar Exercises: Stretches   Lower Trunk  Rotation 2 reps   Lower Trunk Rotation Limitations legs on ball,  increased pain with this so stopped.    Pelvic Tilt 5 reps   Quad Stretch Limitations Quadratus lumborum stretch cues for stretch away from the right, vs stretching both sides.      Lumbar Exercises: Seated   Hip Flexion on Ball Limitations ON ball side to side  10 x small cautious motions, pelvic rock to find neutral, with cued, min assist,  Bounce in neutral increased spasm in neck, then as she moved to table increased spasm in Right hip.  better with manual and gentle stretches     Lumbar Exercises: Supine   Clam 10 reps  2 sets   Clam Limitations Green band issued for home.   Bent Knee Raise Limitations 3 reps each, alternating   Bridge 10 reps   Bridge Limitations legs over ball, some pain   Straight Leg Raise 5 reps  right and left   Large Ball Oblique Isometric Limitations 10, both and single hands 10 x each     Moist Heat Therapy   Number Minutes Moist Heat 10 Minutes   Moist Heat Location Lumbar Spine;Cervical     Manual Therapy   Manual therapy comments supine anterior chest stretch to decrease tpper trap spasm from sitting on ball,  light triggerpoint release to lateral /posterior hip muscles to decrease hip spasm   Passive ROM piriformis to decrease spasp,  Hip IR stretch 2 reps, gentle stretches.                PT Education - 02/29/16 1528    Education provided Yes   Education Details supine ball   Person(s) Educated Patient   Methods Explanation;Demonstration   Comprehension Verbalized understanding;Returned demonstration          PT Short Term Goals - 02/29/16 1533      PT SHORT TERM GOAL #1   Title pt will be I with inital HEP (03/13/2016)   Baseline independent with all except needed cues for QL stretches   Time 4   Period Weeks   Status On-going     PT SHORT TERM GOAL #2   Title she will be able to verbalize and demo proper posture and lifting/ carrying mechanics to prevent and  reduce neck and low back pain (03/13/2016)   Time 4   Period Weeks   Status Unable to assess     PT SHORT TERM GOAL #3   Title she will demonstrate  reduce muscle spasm in bil upper traps and R quadratus lumborum to decrease pain to </= 6/10 in neck /back and improve cervical and trunk mobility (03/12/2016)   Baseline spasm intermittant  up to 8/10   Time 4   Period Weeks   Status On-going     PT SHORT TERM GOAL #4   Title she will improve cervical mobility by >/= 5 degrees in all planes with </= 5/10 pain to assist with functional progression (03/12/2016)   Time 4   Period Weeks   Status Unable to assess     PT SHORT TERM GOAL #5   Title pt will improve lumbar flexion/ extension by >/= 5 degrees and R rotation by to >/= 15% to assist with function with </= 5/10 pain (03/12/2016)   Time 4   Period Weeks   Status Unable to assess     PT SHORT TERM GOAL #6   Title assess Berg balance and make goals accordingly   Time 2   Period Weeks   Status Achieved           PT Long Term Goals - 02/22/16 1757      PT LONG TERM GOAL #1   Title pt will be I with all HEP given as of last visit (04/07/2016)   Time 8   Period Weeks   Status On-going     PT LONG TERM GOAL #2   Title pt will increase cervical flexion / rotation by >/= 15 degrees with </= 2/10 pain to assist with safety during driving and ADLS (01/01/3006)   Period Weeks   Status On-going     PT LONG TERM GOAL #3   Title pt will increase bil LE strength to >/= 4/5 with </= 2/10 pain for prolong walking/ standing ADLS and promote safety (04/07/2016)   Time 8   Period Weeks   Status On-going     PT LONG TERM GOAL #4   Title pt will be able to walk/ stand for >/=30 min with </= 2/10 pain utilizing </= LRAD for safety to promote functional endurance required or ADLs (04/07/2016)   Time 8   Period Weeks   Status On-going     PT LONG TERM GOAL #5   Title pt will increase her FOTO score to </=55% limited to demonstrate  improvement in function at discharge (04/07/2016)   Time 8   Period Weeks   Status Unable to assess     Additional Long Term Goals   Additional Long Term Goals Yes     PT LONG TERM GOAL #6   Title She will increase Her BERG balance by >/= 50/56 for safety during walking/ standing activities with LRAD    Time 7   Period Weeks   Status New               Plan - 02/29/16 1530    Clinical Impression Statement pain improved with DN to 4/10 after last session,  Ball exercises supine tolerated well,  sitting her spasm returmed in neck and then right hip.  Spasm was resolved at the end oof the session.  Patient is not ready for sitting ball exercises.   Patient is able to manage her pain with stretching at home.    PT Next Visit Plan balance,  manual to calm down tightness, stretching, bil hip strengthening, posture education; issue handout for supine ball exercises?  Measure - goals STG   PT Home Exercise Plan clam shells, upper trap stretch,  chin tucks (in supine), QL stretching, pelvic tilts, 11/20:  supine abdominal isometrics, hip stretches into flexion, bridges.   Consulted and Agree with Plan of Care Patient      Patient will benefit from skilled therapeutic intervention in order to improve the following deficits and impairments:  Abnormal gait, Decreased activity tolerance, Decreased balance, Decreased endurance, Pain, Hypomobility, Decreased strength, Increased fascial restricitons, Difficulty walking, Decreased range of motion, Improper body mechanics, Postural dysfunction, Decreased mobility  Visit Diagnosis: Chronic bilateral low back pain with right-sided sciatica  Muscle weakness (generalized)  Cervicalgia  Other abnormalities of gait and mobility  Unsteadiness on feet     Problem List Patient Active Problem List   Diagnosis Date Noted  . Chronic constipation 06/23/2015  . Obesity 06/12/2015  . Chronic diastolic heart failure ([place]) 11/17/2014  . HNP  (herniated nucleus pulposus), lumbar 08/22/2014    Class: Chronic  . Herniated nucleus pulposus, L2-3 right 02/04/2014    Class: Acute  . Urinary retention 05/04/2012    Class: Temporary  . Spondylolisthesis of lumbar region 04/30/2012    Class: Chronic  . Lumbar stenosis with neurogenic claudication 04/30/2012    Class: Chronic  . Chest pain 03/20/2012  . Right shoulder pain 12/07/2010  . ABDOMINAL PAIN, LOWER 09/21/2009  . BACK PAIN 03/18/2009  . SYNCOPE-CAROTID SINUS 09/16/2008  . Essential hypertension 08/05/2008  . Hypothyroidism 08/23/2006  . Diabetes with neurologic complications ([place]) 41/32/4401  . Diabetic neuropathy ([place]) 08/23/2006  . CARDIOMYOPATHY 08/23/2006  . GERD 08/23/2006  . FATTY LIVER DISEASE 08/23/2006  . OSTEOARTHRITIS 08/23/2006  . [place] DISEASE, CERVICAL 08/23/2006  . [place] DISEASE, LUMBAR 08/23/2006  . Headache(784.0) 08/23/2006  . BREAST CANCER, HX OF 08/23/2006  . HYPERPARATHYROIDISM, HX OF 08/23/2006  . RENAL CALCULUS, HX OF 08/23/2006    HARRIS,KAREN PTA 02/29/2016, 3:36 PM  [facility] [address] [place], [place], [address] Phone: [phone]   Fax:  [phone]  Name: [name] MRN: [id number] Date of Birth: September 12, 1950

## 2016-03-02 ENCOUNTER — Encounter: Payer: Self-pay | Admitting: Physical Therapy

## 2016-03-02 ENCOUNTER — Ambulatory Visit: Payer: Commercial Managed Care - HMO | Admitting: Physical Therapy

## 2016-03-02 DIAGNOSIS — G8929 Other chronic pain: Secondary | ICD-10-CM

## 2016-03-02 DIAGNOSIS — M542 Cervicalgia: Secondary | ICD-10-CM | POA: Diagnosis not present

## 2016-03-02 DIAGNOSIS — R2689 Other abnormalities of gait and mobility: Secondary | ICD-10-CM | POA: Diagnosis not present

## 2016-03-02 DIAGNOSIS — R2681 Unsteadiness on feet: Secondary | ICD-10-CM

## 2016-03-02 DIAGNOSIS — M6281 Muscle weakness (generalized): Secondary | ICD-10-CM | POA: Diagnosis not present

## 2016-03-02 DIAGNOSIS — M5441 Lumbago with sciatica, right side: Secondary | ICD-10-CM | POA: Diagnosis not present

## 2016-03-02 NOTE — Therapy (Signed)
[place], [place], [address] Phone: [phone]   Fax:  [phone]  Physical Therapy Treatment  Patient Details  Name: [name] MRN: [id number] Date of Birth: [birth year] Referring Provider: Serafina Mitchell MD  Encounter Date: 03/02/2016      PT End of Session - 03/02/16 1458    Visit Number 7   Number of Visits 17   Date for PT Re-Evaluation 04/07/16   Authorization Type Medicare: Kx mod by 15th visit:  progress note by 10th visit   PT Start Time 1420   PT Stop Time 1500   PT Time Calculation (min) 40 min   Activity Tolerance Patient tolerated treatment well   Behavior During Therapy [facility] for tasks assessed/performed      Past Medical History:  Diagnosis Date  . Anginal pain ([place]) 03/19/2012   saw Dr. Cathie Olden .. she thinks its more related to stomach issues  . Arthritis    "all over; mostly in my back" (03/20/2012)  . Breast cancer ([place])    "right" (03/20/2012)  . Cardiomyopathy   . Chest pain 06/2005   Hospitalized, dystolic dysfunction  . Chronic lower back pain    "have 2 herniated discs; going to have to have a fusion" (03/20/2012)  . Family history of anesthesia complication    "father has nausea and vomiting"  . GERD (gastroesophageal reflux disease)   . H/O hiatal hernia   . History of kidney stones   . History of right mastectomy   . Hypertension    "patient states never had HTN, takes Hyzaar for heart  . Hypothyroidism   . Irritable bowel syndrome   . Migraines    "it's been a long time since I've had one" (03/20/2012)  . Osteoarthritis   . Peripheral neuropathy ([place])   . PONV (postoperative nausea and vomiting)   . Sciatica   . Type II diabetes mellitus ([place])     Past Surgical History:  Procedure Laterality Date  . ABDOMINAL HYSTERECTOMY  1993  . adenosin cardiolite  07/2005   (+) wall motion abnormality  . AXILLARY LYMPH NODE DISSECTION  2003   squamous cell  cancer  . BACK SURGERY    . CARDIAC CATHETERIZATION  ? 2005 / 2007  . CT abdomen and pelvis  02/2010   Same  . ESOPHAGOGASTRODUODENOSCOPY  2005   GERD  . KNEE ARTHROSCOPY  1990's   "right" (03/20/2012)  . LUMBAR FUSION N/A 08/22/2014   Procedure: Right L2-3 and right L1-2 transforaminal lumbar interbody fusion with pedicle screws, rods, sleeves, cages, local bone graft, Vivigen, cancellous chips;  Surgeon: Jessy Oto, MD;  Location: [facility];  Service: Orthopedics;  Laterality: N/A;  . LUMBAR LAMINECTOMY/DECOMPRESSION MICRODISCECTOMY N/A 02/04/2014   Procedure: RIGHT L2-3 MICRODISCECTOMY ;  Surgeon: Jessy Oto, MD;  Location: [facility];  Service: Orthopedics;  Laterality: N/A;  . MASTECTOMY WITH AXILLARY LYMPH NODE DISSECTION  12/2003   "right" (03/20/2012)  . POSTERIOR CERVICAL FUSION/FORAMINOTOMY  2001  . SHOULDER ARTHROSCOPY W/ ROTATOR CUFF REPAIR  2003   Left  . THYROIDECTOMY  1977   Right  . TOTAL KNEE ARTHROPLASTY  2000   Right  . [place] of abdomen  07/2005   Fatty liver / kidney stones    There were no vitals filed for this visit.      Subjective Assessment - 03/02/16 1448    Subjective pt reporting 4/10 pain in low back upon arrival to therapy today. Pt reporting  headache for the last 3 days.    Pertinent History hx of cervical fusion, lumbar fusion L1-L3, hx of cervical fusion (pt unable to state what levels ) from previous imagine fusion at T1-C5   Limitations Sitting;Lifting;Standing;Walking;House hold activities   How long can you sit comfortably? couple hours   How long can you stand comfortably? 10 min   How long can you walk comfortably? 20 walking (pushing her self)   Diagnostic tests MRI neck   Patient Stated Goals standing longer, decrease pain, decreased referred pain, improve balance/ strength. be able to have better posture   Currently in Pain? Yes   Pain Score 4    Pain Location Back   Pain Orientation Right;Lower   Pain Descriptors / Indicators Aching    Pain Type Chronic pain   Pain Onset More than a month ago   Pain Frequency Intermittent   Aggravating Factors  crochet, sitting without support   Pain Relieving Factors lying down, pain meds   Multiple Pain Sites Yes   Pain Score 2   Pain Location Neck   Pain Orientation Right   Pain Descriptors / Indicators Aching   Pain Type Chronic pain   Pain Frequency Intermittent   Pain Relieving Factors stretching, pain meds                         [facility] Adult PT Treatment/Exercise - 03/02/16 0001      Transfers   Comments supine to sit, pt instructed in log rolling     Lumbar Exercises: Stretches   Lower Trunk Rotation 5 reps;10 seconds   Pelvic Tilt 5 reps;10 seconds  2 sets     Lumbar Exercises: Aerobic   Stationary Bike nustep level 3 8 minutes     Lumbar Exercises: Supine   Clam --  2 sets   Bent Knee Raise Limitations 3 reps each, alternating   Bridge 10 reps   Other Supine Lumbar Exercises supine marching x 20 , hamstring stretches x 3 holding 30 seconds each on the R LE     Moist Heat Therapy   Number Minutes Moist Heat 10 Minutes   Moist Heat Location Lumbar Spine     Manual Therapy   Soft tissue mobilization lumbar paraspinals on right, R hip and piriformis and IT band                PT Education - 03/02/16 1703    Education provided Yes   Education Details HEP, stretching and posture importance   Person(s) Educated Patient   Methods Explanation;Demonstration   Comprehension Verbalized understanding;Returned demonstration          PT Short Term Goals - 03/02/16 1500      PT SHORT TERM GOAL #1   Title pt will be I with inital HEP (03/13/2016)   Baseline independent with all except needed cues for QL stretches   Period Weeks   Status On-going     PT SHORT TERM GOAL #2   Title she will be able to verbalize and demo proper posture and lifting/ carrying mechanics to prevent and reduce neck and low back pain (03/13/2016)   Time 4    Period Weeks   Status Unable to assess     PT SHORT TERM GOAL #3   Title she will demonstrate reduce muscle spasm in bil upper traps and R quadratus lumborum to decrease pain to </= 6/10 in neck /back and improve cervical and trunk mobility (  03/12/2016)   Baseline spasm intermittant  up to 8/10   Time 4   Period Weeks   Status On-going     PT SHORT TERM GOAL #4   Title she will improve cervical mobility by >/= 5 degrees in all planes with </= 5/10 pain to assist with functional progression (03/12/2016)   Time 4   Period Weeks   Status Unable to assess     PT SHORT TERM GOAL #5   Title pt will improve lumbar flexion/ extension by >/= 5 degrees and R rotation by to >/= 15% to assist with function with </= 5/10 pain (03/12/2016)   Status Unable to assess     PT SHORT TERM GOAL #6   Title assess Berg balance and make goals accordingly   Time 2   Period Weeks   Status Achieved           PT Long Term Goals - 03/02/16 1509      PT LONG TERM GOAL #1   Title pt will be I with all HEP given as of last visit (04/07/2016)   Time 8   Period Weeks   Status On-going     PT LONG TERM GOAL #2   Title pt will increase cervical flexion / rotation by >/= 15 degrees with </= 2/10 pain to assist with safety during driving and ADLS (62/69/4854)   Time 8   Period Weeks   Status On-going     PT LONG TERM GOAL #3   Title pt will increase bil LE strength to >/= 4/5 with </= 2/10 pain for prolong walking/ standing ADLS and promote safety (04/07/2016)   Time 8   Period Weeks   Status On-going     PT LONG TERM GOAL #4   Title pt will be able to walk/ stand for >/=30 min with </= 2/10 pain utilizing </= LRAD for safety to promote functional endurance required or ADLs (04/07/2016)   Time 8   Period Weeks     PT LONG TERM GOAL #5   Title pt will increase her FOTO score to </=55% limited to demonstrate improvement in function at discharge (04/07/2016)   Period Weeks   Status Unable to assess                Plan - 03/02/16 1704    Clinical Impression Statement Pt arriving today complaining of low back pain on the right and a headache/cervical pain. Pt reporting worse pain in her low back and tolerted treatment well with no ball exercises performed today. pt instructed to continue her HEP with sidelying clam shells added. Pt also discussed dry needling again in upper trap for next week. Skilled PT needed to progress pt toward her goals set and decrease pts pain which will improve her quality of life and function.    Rehab Potential Good   PT Frequency 3x / week   PT Duration 8 weeks   PT Treatment/Interventions ADLs/Self Care Home Management;Cryotherapy;Electrical Stimulation;Iontophoresis 4mg /ml Dexamethasone;Moist Heat;Ultrasound;Gait training;Therapeutic activities;Therapeutic exercise;Dry needling;Taping;Patient/family education;Manual techniques;Balance training;Neuromuscular re-education;Passive range of motion;Stair training;Functional mobility training;DME Instruction   PT Next Visit Plan manual to calm down tightness, stretching, bil hip strengthening, posture education   PT Home Exercise Plan clam shells, upper trap stretch, chin tucks (in supine), QL stretching, pelvic tilts, 11/20:  supine abdominal isometrics, hip stretches into flexion, bridges, Dry Needling   Consulted and Agree with Plan of Care Patient      Patient will benefit from skilled therapeutic intervention  in order to improve the following deficits and impairments:  Abnormal gait, Decreased activity tolerance, Decreased balance, Decreased endurance, Pain, Hypomobility, Decreased strength, Increased fascial restricitons, Difficulty walking, Decreased range of motion, Improper body mechanics, Postural dysfunction, Decreased mobility  Visit Diagnosis: Chronic bilateral low back pain with right-sided sciatica  Muscle weakness (generalized)  Cervicalgia  Other abnormalities of gait and  mobility  Unsteadiness on feet     Problem List Patient Active Problem List   Diagnosis Date Noted  . Chronic constipation 06/23/2015  . Obesity 06/12/2015  . Chronic diastolic heart failure ([place]) 11/17/2014  . HNP (herniated nucleus pulposus), lumbar 08/22/2014    Class: Chronic  . Herniated nucleus pulposus, L2-3 right 02/04/2014    Class: Acute  . Urinary retention 05/04/2012    Class: Temporary  . Spondylolisthesis of lumbar region 04/30/2012    Class: Chronic  . Lumbar stenosis with neurogenic claudication 04/30/2012    Class: Chronic  . Chest pain 03/20/2012  . Right shoulder pain 12/07/2010  . ABDOMINAL PAIN, LOWER 09/21/2009  . BACK PAIN 03/18/2009  . SYNCOPE-CAROTID SINUS 09/16/2008  . Essential hypertension 08/05/2008  . Hypothyroidism 08/23/2006  . Diabetes with neurologic complications ([place]) 50/53/9767  . Diabetic neuropathy ([place]) 08/23/2006  . CARDIOMYOPATHY 08/23/2006  . GERD 08/23/2006  . FATTY LIVER DISEASE 08/23/2006  . OSTEOARTHRITIS 08/23/2006  . [place] DISEASE, CERVICAL 08/23/2006  . [place] DISEASE, LUMBAR 08/23/2006  . Headache(784.0) 08/23/2006  . BREAST CANCER, HX OF 08/23/2006  . HYPERPARATHYROIDISM, HX OF 08/23/2006  . RENAL CALCULUS, HX OF 08/23/2006    Oretha Caprice, MPT 03/02/2016, 5:10 PM  [facility] [address] [place], [place], [address] Phone: [phone]   Fax:  [phone]  Name: [name] MRN: [id number] Date of Birth: 07/28/1950

## 2016-03-07 ENCOUNTER — Ambulatory Visit: Payer: Commercial Managed Care - HMO | Admitting: Physical Therapy

## 2016-03-07 DIAGNOSIS — R2681 Unsteadiness on feet: Secondary | ICD-10-CM

## 2016-03-07 DIAGNOSIS — M542 Cervicalgia: Secondary | ICD-10-CM

## 2016-03-07 DIAGNOSIS — G8929 Other chronic pain: Secondary | ICD-10-CM | POA: Diagnosis not present

## 2016-03-07 DIAGNOSIS — M5441 Lumbago with sciatica, right side: Secondary | ICD-10-CM | POA: Diagnosis not present

## 2016-03-07 DIAGNOSIS — R2689 Other abnormalities of gait and mobility: Secondary | ICD-10-CM

## 2016-03-07 DIAGNOSIS — M6281 Muscle weakness (generalized): Secondary | ICD-10-CM | POA: Diagnosis not present

## 2016-03-07 NOTE — Therapy (Signed)
[place], [place], [address] Phone: [phone]   Fax:  [phone]  Physical Therapy Treatment  Patient Details  Name: [name] MRN: [id number] Date of Birth: 01-Aug-1950 Referring Provider: Serafina Mitchell MD  Encounter Date: 03/07/2016      PT End of Session - 03/07/16 1842    Visit Number 8   Number of Visits 17   Date for PT Re-Evaluation 04/07/16   PT Start Time 1148   PT Stop Time 1250   PT Time Calculation (min) 62 min   Activity Tolerance Patient limited by lethargy   Behavior During Therapy [facility] for tasks assessed/performed      Past Medical History:  Diagnosis Date  . Anginal pain ([place]) 03/19/2012   saw Dr. Cathie Olden .. she thinks its more related to stomach issues  . Arthritis    "all over; mostly in my back" (03/20/2012)  . Breast cancer ([place])    "right" (03/20/2012)  . Cardiomyopathy   . Chest pain 06/2005   Hospitalized, dystolic dysfunction  . Chronic lower back pain    "have 2 herniated discs; going to have to have a fusion" (03/20/2012)  . Family history of anesthesia complication    "father has nausea and vomiting"  . GERD (gastroesophageal reflux disease)   . H/O hiatal hernia   . History of kidney stones   . History of right mastectomy   . Hypertension    "patient states never had HTN, takes Hyzaar for heart  . Hypothyroidism   . Irritable bowel syndrome   . Migraines    "it's been a long time since I've had one" (03/20/2012)  . Osteoarthritis   . Peripheral neuropathy ([place])   . PONV (postoperative nausea and vomiting)   . Sciatica   . Type II diabetes mellitus ([place])     Past Surgical History:  Procedure Laterality Date  . ABDOMINAL HYSTERECTOMY  1993  . adenosin cardiolite  07/2005   (+) wall motion abnormality  . AXILLARY LYMPH NODE DISSECTION  2003   squamous cell cancer  . BACK SURGERY    . CARDIAC CATHETERIZATION  ? 2005 / 2007  . CT abdomen and  pelvis  02/2010   Same  . ESOPHAGOGASTRODUODENOSCOPY  2005   GERD  . KNEE ARTHROSCOPY  1990's   "right" (03/20/2012)  . LUMBAR FUSION N/A 08/22/2014   Procedure: Right L2-3 and right L1-2 transforaminal lumbar interbody fusion with pedicle screws, rods, sleeves, cages, local bone graft, Vivigen, cancellous chips;  Surgeon: Jessy Oto, MD;  Location: [facility];  Service: Orthopedics;  Laterality: N/A;  . LUMBAR LAMINECTOMY/DECOMPRESSION MICRODISCECTOMY N/A 02/04/2014   Procedure: RIGHT L2-3 MICRODISCECTOMY ;  Surgeon: Jessy Oto, MD;  Location: [facility];  Service: Orthopedics;  Laterality: N/A;  . MASTECTOMY WITH AXILLARY LYMPH NODE DISSECTION  12/2003   "right" (03/20/2012)  . POSTERIOR CERVICAL FUSION/FORAMINOTOMY  2001  . SHOULDER ARTHROSCOPY W/ ROTATOR CUFF REPAIR  2003   Left  . THYROIDECTOMY  1977   Right  . TOTAL KNEE ARTHROPLASTY  2000   Right  . [place] of abdomen  07/2005   Fatty liver / kidney stones    There were no vitals filed for this visit.      Subjective Assessment - 03/07/16 1150    Subjective I feel like I hurt from arthritis.  Sat I could not get out of bed.   I hurt all over.   Pertinent History hx of cervical fusion,  lumbar fusion L1-L3, hx of cervical fusion (pt unable to state what levels ) from previous imagine fusion at T1-C5   Currently in Pain? Yes   Pain Score 9    Pain Location --  whole body   Pain Descriptors / Indicators --  arthritis soreness   Pain Type Chronic pain   Pain Frequency Intermittent   Aggravating Factors  Walking at [employer], cold weather   Pain Relieving Factors Pain meds, lying dowm                          [facility] Adult PT Treatment/Exercise - 03/07/16 0001      Neck Exercises: Machines for Strengthening   Other Machines for Strengthening supine scapular stabilization : 10 x each, yellow band ER, narrow grip yellow band.     Neck Exercises: Supine   Neck Retraction 5 reps   Other Supine Exercise scapular  retraction     Lumbar Exercises: Aerobic   Stationary Bike Nustep L3 X 5 minutes     Lumbar Exercises: Supine   Ab Set 5 reps   Bent Knee Raise 10 reps   Bridge 10 reps   Bridge Limitations small lifts     Lumbar Exercises: Sidelying   Clam 10 reps  10 x both, min assist for hip position     Shoulder Exercises: Supine   External Rotation 10 reps   Theraband Level (Shoulder External Rotation) Level 1 (Yellow)   Flexion 10 reps   Theraband Level (Shoulder Flexion) Level 1 (Yellow)   Flexion Limitations Narrow grip      Moist Heat Therapy   Number Minutes Moist Heat 10 Minutes   Moist Heat Location Hip;Cervical     Manual Therapy   Manual therapy comments instrument assist IT band and right hip lateral, Instrument assist and manual, tissue softened. light pressure                   PT Short Term Goals - 03/07/16 1845      PT SHORT TERM GOAL #1   Title pt will be I with inital HEP (03/13/2016)   Time 4   Period Weeks   Status Unable to assess     PT SHORT TERM GOAL #2   Time 4   Period Weeks   Status Unable to assess     PT SHORT TERM GOAL #3   Title she will demonstrate reduce muscle spasm in bil upper traps and R quadratus lumborum to decrease pain to </= 6/10 in neck /back and improve cervical and trunk mobility (03/12/2016)   Baseline spasm intermittant up to 8-9/10  (Less frequent)   Time 4   Period Weeks   Status On-going     PT SHORT TERM GOAL #4   Title she will improve cervical mobility by >/= 5 degrees in all planes with </= 5/10 pain to assist with functional progression (03/12/2016)   Time 4   Period Weeks   Status Unable to assess     PT SHORT TERM GOAL #5   Title pt will improve lumbar flexion/ extension by >/= 5 degrees and R rotation by to >/= 15% to assist with function with </= 5/10 pain (03/12/2016)   Time 4   Period Weeks   Status Unable to assess     PT SHORT TERM GOAL #6   Title assess Berg balance and make goals accordingly    Time 2   Period Weeks  Status Achieved           PT Long Term Goals - 03/02/16 1509      PT LONG TERM GOAL #1   Title pt will be I with all HEP given as of last visit (04/07/2016)   Time 8   Period Weeks   Status On-going     PT LONG TERM GOAL #2   Title pt will increase cervical flexion / rotation by >/= 15 degrees with </= 2/10 pain to assist with safety during driving and ADLS (93/23/5573)   Time 8   Period Weeks   Status On-going     PT LONG TERM GOAL #3   Title pt will increase bil LE strength to >/= 4/5 with </= 2/10 pain for prolong walking/ standing ADLS and promote safety (04/07/2016)   Time 8   Period Weeks   Status On-going     PT LONG TERM GOAL #4   Title pt will be able to walk/ stand for >/=30 min with </= 2/10 pain utilizing </= LRAD for safety to promote functional endurance required or ADLs (04/07/2016)   Time 8   Period Weeks     PT LONG TERM GOAL #5   Title pt will increase her FOTO score to </=55% limited to demonstrate improvement in function at discharge (04/07/2016)   Period Weeks   Status Unable to assess               Plan - 03/07/16 1843    Clinical Impression Statement Patient is sore all over today. stabilization was focus of today's session.  Pain decreased to 7/10 after exercises.  No new goals met or assessed.   PT Next Visit Plan Balance exercise? manual to calm down tightness, stretching, bil hip strengthening, posture education,  Measure Neck ROM , check specific goals. Update HEp as needed   PT Home Exercise Plan clam shells, upper trap stretch, chin tucks (in supine), QL stretching, pelvic tilts, 11/20:  supine abdominal isometrics, hip stretches into flexion, bridges, Dry Needling   Consulted and Agree with Plan of Care Patient      Patient will benefit from skilled therapeutic intervention in order to improve the following deficits and impairments:  Abnormal gait, Decreased activity tolerance, Decreased balance, Decreased  endurance, Pain, Hypomobility, Decreased strength, Increased fascial restricitons, Difficulty walking, Decreased range of motion, Improper body mechanics, Postural dysfunction, Decreased mobility  Visit Diagnosis: Chronic bilateral low back pain with right-sided sciatica  Muscle weakness (generalized)  Cervicalgia  Other abnormalities of gait and mobility  Unsteadiness on feet     Problem List Patient Active Problem List   Diagnosis Date Noted  . Chronic constipation 06/23/2015  . Obesity 06/12/2015  . Chronic diastolic heart failure ([place]) 11/17/2014  . HNP (herniated nucleus pulposus), lumbar 08/22/2014    Class: Chronic  . Herniated nucleus pulposus, L2-3 right 02/04/2014    Class: Acute  . Urinary retention 05/04/2012    Class: Temporary  . Spondylolisthesis of lumbar region 04/30/2012    Class: Chronic  . Lumbar stenosis with neurogenic claudication 04/30/2012    Class: Chronic  . Chest pain 03/20/2012  . Right shoulder pain 12/07/2010  . ABDOMINAL PAIN, LOWER 09/21/2009  . BACK PAIN 03/18/2009  . SYNCOPE-CAROTID SINUS 09/16/2008  . Essential hypertension 08/05/2008  . Hypothyroidism 08/23/2006  . Diabetes with neurologic complications ([place]) 22/05/5425  . Diabetic neuropathy ([place]) 08/23/2006  . CARDIOMYOPATHY 08/23/2006  . GERD 08/23/2006  . FATTY LIVER DISEASE 08/23/2006  . OSTEOARTHRITIS 08/23/2006  .  [place] DISEASE, CERVICAL 08/23/2006  . [place] DISEASE, LUMBAR 08/23/2006  . Headache(784.0) 08/23/2006  . BREAST CANCER, HX OF 08/23/2006  . HYPERPARATHYROIDISM, HX OF 08/23/2006  . RENAL CALCULUS, HX OF 08/23/2006    Chalon Zobrist PTA 03/07/2016, 6:48 PM  [facility] [address] [place], [place], [address] Phone: [phone]   Fax:  [phone]  Name: [name] MRN: [id number] Date of Birth: 04/18/1950

## 2016-03-09 ENCOUNTER — Ambulatory Visit: Payer: Commercial Managed Care - HMO | Admitting: Physical Therapy

## 2016-03-09 DIAGNOSIS — G8929 Other chronic pain: Secondary | ICD-10-CM

## 2016-03-09 DIAGNOSIS — M542 Cervicalgia: Secondary | ICD-10-CM

## 2016-03-09 DIAGNOSIS — R2689 Other abnormalities of gait and mobility: Secondary | ICD-10-CM

## 2016-03-09 DIAGNOSIS — R2681 Unsteadiness on feet: Secondary | ICD-10-CM

## 2016-03-09 DIAGNOSIS — M5441 Lumbago with sciatica, right side: Principal | ICD-10-CM

## 2016-03-09 DIAGNOSIS — M6281 Muscle weakness (generalized): Secondary | ICD-10-CM | POA: Diagnosis not present

## 2016-03-09 NOTE — Therapy (Signed)
[place], [place], [address] Phone: [phone]   Fax:  [phone]  Physical Therapy Treatment  Patient Details  Name: [name] MRN: [id number] Date of Birth: 02/01/1951 Referring Provider: Serafina Mitchell MD  Encounter Date: 03/09/2016      PT End of Session - 03/09/16 1224    Visit Number 9   Number of Visits 17   Date for PT Re-Evaluation 04/07/16   Authorization Type Medicare: Kx mod by 15th visit:  progress note by 10th visit   PT Start Time 1155   PT Stop Time 1245   PT Time Calculation (min) 50 min      Past Medical History:  Diagnosis Date  . Anginal pain ([place]) 03/19/2012   saw Dr. Cathie Olden .. she thinks its more related to stomach issues  . Arthritis    "all over; mostly in my back" (03/20/2012)  . Breast cancer ([place])    "right" (03/20/2012)  . Cardiomyopathy   . Chest pain 06/2005   Hospitalized, dystolic dysfunction  . Chronic lower back pain    "have 2 herniated discs; going to have to have a fusion" (03/20/2012)  . Family history of anesthesia complication    "father has nausea and vomiting"  . GERD (gastroesophageal reflux disease)   . H/O hiatal hernia   . History of kidney stones   . History of right mastectomy   . Hypertension    "patient states never had HTN, takes Hyzaar for heart  . Hypothyroidism   . Irritable bowel syndrome   . Migraines    "it's been a long time since I've had one" (03/20/2012)  . Osteoarthritis   . Peripheral neuropathy ([place])   . PONV (postoperative nausea and vomiting)   . Sciatica   . Type II diabetes mellitus ([place])     Past Surgical History:  Procedure Laterality Date  . ABDOMINAL HYSTERECTOMY  1993  . adenosin cardiolite  07/2005   (+) wall motion abnormality  . AXILLARY LYMPH NODE DISSECTION  2003   squamous cell cancer  . BACK SURGERY    . CARDIAC CATHETERIZATION  ? 2005 / 2007  . CT abdomen and pelvis  02/2010   Same  .  ESOPHAGOGASTRODUODENOSCOPY  2005   GERD  . KNEE ARTHROSCOPY  1990's   "right" (03/20/2012)  . LUMBAR FUSION N/A 08/22/2014   Procedure: Right L2-3 and right L1-2 transforaminal lumbar interbody fusion with pedicle screws, rods, sleeves, cages, local bone graft, Vivigen, cancellous chips;  Surgeon: Jessy Oto, MD;  Location: [facility];  Service: Orthopedics;  Laterality: N/A;  . LUMBAR LAMINECTOMY/DECOMPRESSION MICRODISCECTOMY N/A 02/04/2014   Procedure: RIGHT L2-3 MICRODISCECTOMY ;  Surgeon: Jessy Oto, MD;  Location: [facility];  Service: Orthopedics;  Laterality: N/A;  . MASTECTOMY WITH AXILLARY LYMPH NODE DISSECTION  12/2003   "right" (03/20/2012)  . POSTERIOR CERVICAL FUSION/FORAMINOTOMY  2001  . SHOULDER ARTHROSCOPY W/ ROTATOR CUFF REPAIR  2003   Left  . THYROIDECTOMY  1977   Right  . TOTAL KNEE ARTHROPLASTY  2000   Right  . [place] of abdomen  07/2005   Fatty liver / kidney stones    There were no vitals filed for this visit.      Subjective Assessment - 03/09/16 1223    Subjective I back really only hurts when i am up for awhile and I can feel the pins in my back when I go to lay down.    Currently in  Pain? Yes   Pain Score 4    Pain Location Back   Pain Descriptors / Indicators Aching                         [facility] Adult PT Treatment/Exercise - 03/09/16 0001      Lumbar Exercises: Standing   Other Standing Lumbar Exercises standing 3 way hip with ab set and posture cues x 10 each bilateral      Lumbar Exercises: Seated   Sit to Stand 10 reps   Sit to Stand Limitations from foam pad to elevate seat no UE support      Lumbar Exercises: Supine   Bridge 10 reps   Bridge Limitations small lifts with feet on ball- increased pain so discontinued      Moist Heat Therapy   Number Minutes Moist Heat 10 Minutes   Moist Heat Location Lumbar Spine  sidelying      Electrical Stimulation   Electrical Stimulation Location Lumbar   Electrical Stimulation Action IFC  x 15 min   Electrical Stimulation Parameters 24ma   Electrical Stimulation Goals Pain                  PT Short Term Goals - 03/07/16 1845      PT SHORT TERM GOAL #1   Title pt will be I with inital HEP (03/13/2016)   Time 4   Period Weeks   Status Unable to assess     PT SHORT TERM GOAL #2   Time 4   Period Weeks   Status Unable to assess     PT SHORT TERM GOAL #3   Title she will demonstrate reduce muscle spasm in bil upper traps and R quadratus lumborum to decrease pain to </= 6/10 in neck /back and improve cervical and trunk mobility (03/12/2016)   Baseline spasm intermittant up to 8-9/10  (Less frequent)   Time 4   Period Weeks   Status On-going     PT SHORT TERM GOAL #4   Title she will improve cervical mobility by >/= 5 degrees in all planes with </= 5/10 pain to assist with functional progression (03/12/2016)   Time 4   Period Weeks   Status Unable to assess     PT SHORT TERM GOAL #5   Title pt will improve lumbar flexion/ extension by >/= 5 degrees and R rotation by to >/= 15% to assist with function with </= 5/10 pain (03/12/2016)   Time 4   Period Weeks   Status Unable to assess     PT SHORT TERM GOAL #6   Title assess Berg balance and make goals accordingly   Time 2   Period Weeks   Status Achieved           PT Long Term Goals - 03/02/16 1509      PT LONG TERM GOAL #1   Title pt will be I with all HEP given as of last visit (04/07/2016)   Time 8   Period Weeks   Status On-going     PT LONG TERM GOAL #2   Title pt will increase cervical flexion / rotation by >/= 15 degrees with </= 2/10 pain to assist with safety during driving and ADLS (52/77/8242)   Time 8   Period Weeks   Status On-going     PT LONG TERM GOAL #3   Title pt will increase bil LE strength to >/= 4/5 with </=  2/10 pain for prolong walking/ standing ADLS and promote safety (04/07/2016)   Time 8   Period Weeks   Status On-going     PT LONG TERM GOAL #4   Title pt  will be able to walk/ stand for >/=30 min with </= 2/10 pain utilizing </= LRAD for safety to promote functional endurance required or ADLs (04/07/2016)   Time 8   Period Weeks     PT LONG TERM GOAL #5   Title pt will increase her FOTO score to </=55% limited to demonstrate improvement in function at discharge (04/07/2016)   Period Weeks   Status Unable to assess               Plan - 03/09/16 1327    Clinical Impression Statement Patient started late due to scheduling. She reports pain mostly with activity on feet. We began standing 3 way hip incorporating core bracing. Increased back pain during last set of standing therex. She c/ o difficulty getting up from low seats so we worked on sit-stands from elevated seat without UE use. Repeated IFC for low back pain. Pt reports feeling better at end of treatment.    PT Next Visit Plan manual to calm down tightness, stretching, bil hip strengthening, posture education,  Measure Neck ROM , check specific goals. Update HEp as needed   PT Home Exercise Plan clam shells, upper trap stretch, chin tucks (in supine), QL stretching, pelvic tilts, 11/20:  supine abdominal isometrics, hip stretches into flexion, bridges, Dry Needling      Patient will benefit from skilled therapeutic intervention in order to improve the following deficits and impairments:  Abnormal gait, Decreased activity tolerance, Decreased balance, Decreased endurance, Pain, Hypomobility, Decreased strength, Increased fascial restricitons, Difficulty walking, Decreased range of motion, Improper body mechanics, Postural dysfunction, Decreased mobility  Visit Diagnosis: Chronic bilateral low back pain with right-sided sciatica  Muscle weakness (generalized)  Cervicalgia  Other abnormalities of gait and mobility  Unsteadiness on feet     Problem List Patient Active Problem List   Diagnosis Date Noted  . Chronic constipation 06/23/2015  . Obesity 06/12/2015  . Chronic  diastolic heart failure ([place]) 11/17/2014  . HNP (herniated nucleus pulposus), lumbar 08/22/2014    Class: Chronic  . Herniated nucleus pulposus, L2-3 right 02/04/2014    Class: Acute  . Urinary retention 05/04/2012    Class: Temporary  . Spondylolisthesis of lumbar region 04/30/2012    Class: Chronic  . Lumbar stenosis with neurogenic claudication 04/30/2012    Class: Chronic  . Chest pain 03/20/2012  . Right shoulder pain 12/07/2010  . ABDOMINAL PAIN, LOWER 09/21/2009  . BACK PAIN 03/18/2009  . SYNCOPE-CAROTID SINUS 09/16/2008  . Essential hypertension 08/05/2008  . Hypothyroidism 08/23/2006  . Diabetes with neurologic complications ([place]) 25/42/7062  . Diabetic neuropathy ([place]) 08/23/2006  . CARDIOMYOPATHY 08/23/2006  . GERD 08/23/2006  . FATTY LIVER DISEASE 08/23/2006  . OSTEOARTHRITIS 08/23/2006  . [place] DISEASE, CERVICAL 08/23/2006  . [place] DISEASE, LUMBAR 08/23/2006  . Headache(784.0) 08/23/2006  . BREAST CANCER, HX OF 08/23/2006  . HYPERPARATHYROIDISM, HX OF 08/23/2006  . RENAL CALCULUS, HX OF 08/23/2006    Dorene Ar, PTA 03/09/2016, 1:50 PM  [place], [place], [address] Phone: [phone]   Fax:  [phone]  Name: MAURIAH MCMILLEN MRN: [id number] Date of Birth: 03-04-1951

## 2016-03-11 ENCOUNTER — Ambulatory Visit: Payer: Commercial Managed Care - HMO | Attending: Specialist | Admitting: Physical Therapy

## 2016-03-11 DIAGNOSIS — M542 Cervicalgia: Secondary | ICD-10-CM | POA: Diagnosis not present

## 2016-03-11 DIAGNOSIS — R2689 Other abnormalities of gait and mobility: Secondary | ICD-10-CM | POA: Diagnosis not present

## 2016-03-11 DIAGNOSIS — M6281 Muscle weakness (generalized): Secondary | ICD-10-CM | POA: Insufficient documentation

## 2016-03-11 DIAGNOSIS — R2681 Unsteadiness on feet: Secondary | ICD-10-CM | POA: Insufficient documentation

## 2016-03-11 DIAGNOSIS — M5441 Lumbago with sciatica, right side: Secondary | ICD-10-CM | POA: Insufficient documentation

## 2016-03-11 DIAGNOSIS — G8929 Other chronic pain: Secondary | ICD-10-CM | POA: Diagnosis not present

## 2016-03-11 NOTE — Therapy (Signed)
[place], [place], [address] Phone: [phone]   Fax:  [phone]  Physical Therapy Treatment  Patient Details  Name: [name] MRN: [id number] Date of Birth: October 01, 1950 Referring Provider: Serafina Mitchell MD  Encounter Date: 03/11/2016      PT End of Session - 03/11/16 1151    Visit Number 10   Number of Visits 17   Date for PT Re-Evaluation 04/07/16   Authorization Type Medicare: Kx mod by 15th visit:  progress note by 10th visit   PT Start Time 1139   PT Stop Time 1245   PT Time Calculation (min) 66 min      Past Medical History:  Diagnosis Date  . Anginal pain ([place]) 03/19/2012   saw Dr. Cathie Olden .. she thinks its more related to stomach issues  . Arthritis    "all over; mostly in my back" (03/20/2012)  . Breast cancer ([place])    "right" (03/20/2012)  . Cardiomyopathy   . Chest pain 06/2005   Hospitalized, dystolic dysfunction  . Chronic lower back pain    "have 2 herniated discs; going to have to have a fusion" (03/20/2012)  . Family history of anesthesia complication    "father has nausea and vomiting"  . GERD (gastroesophageal reflux disease)   . H/O hiatal hernia   . History of kidney stones   . History of right mastectomy   . Hypertension    "patient states never had HTN, takes Hyzaar for heart  . Hypothyroidism   . Irritable bowel syndrome   . Migraines    "it's been a long time since I've had one" (03/20/2012)  . Osteoarthritis   . Peripheral neuropathy ([place])   . PONV (postoperative nausea and vomiting)   . Sciatica   . Type II diabetes mellitus ([place])     Past Surgical History:  Procedure Laterality Date  . ABDOMINAL HYSTERECTOMY  1993  . adenosin cardiolite  07/2005   (+) wall motion abnormality  . AXILLARY LYMPH NODE DISSECTION  2003   squamous cell cancer  . BACK SURGERY    . CARDIAC CATHETERIZATION  ? 2005 / 2007  . CT abdomen and pelvis  02/2010   Same  .  ESOPHAGOGASTRODUODENOSCOPY  2005   GERD  . KNEE ARTHROSCOPY  1990's   "right" (03/20/2012)  . LUMBAR FUSION N/A 08/22/2014   Procedure: Right L2-3 and right L1-2 transforaminal lumbar interbody fusion with pedicle screws, rods, sleeves, cages, local bone graft, Vivigen, cancellous chips;  Surgeon: Jessy Oto, MD;  Location: [facility];  Service: Orthopedics;  Laterality: N/A;  . LUMBAR LAMINECTOMY/DECOMPRESSION MICRODISCECTOMY N/A 02/04/2014   Procedure: RIGHT L2-3 MICRODISCECTOMY ;  Surgeon: Jessy Oto, MD;  Location: [facility];  Service: Orthopedics;  Laterality: N/A;  . MASTECTOMY WITH AXILLARY LYMPH NODE DISSECTION  12/2003   "right" (03/20/2012)  . POSTERIOR CERVICAL FUSION/FORAMINOTOMY  2001  . SHOULDER ARTHROSCOPY W/ ROTATOR CUFF REPAIR  2003   Left  . THYROIDECTOMY  1977   Right  . TOTAL KNEE ARTHROPLASTY  2000   Right  . [place] of abdomen  07/2005   Fatty liver / kidney stones    There were no vitals filed for this visit.      Subjective Assessment - 03/11/16 1239    Subjective It hurts to lay on my couch, I can hardly walk when I get up.    Currently in Pain? Yes   Pain Score 4    Pain  Location Back   Pain Orientation Right;Left;Lower   Pain Score 6   Pain Location Neck   Pain Orientation Left   Aggravating Factors  lying flat without support, prolonged walking    Pain Relieving Factors stretching, dry needling, pain meds             [facility] PT Assessment - 03/11/16 0001      AROM   Cervical Flexion 26  40 after TPDN   Cervical Extension 26   Cervical - Right Side Bend 20  38 after TPDN   Cervical - Left Side Bend 25  30 after TPDN   Cervical - Right Rotation 38  42 after TPDN   Cervical - Left Rotation 38  ERP                     [facility] Adult PT Treatment/Exercise - 03/11/16 0001      Lumbar Exercises: Aerobic   Stationary Bike Nustep L3 X 7 minutes     Moist Heat Therapy   Number Minutes Moist Heat 15 Minutes   Moist Heat Location Lumbar  Spine     Manual Therapy   Myofascial Release fascial rolling/stretching of bilateral upper traps and left glute medius   as well as IASTM      Neck Exercises: Stretches   Upper Trapezius Stretch 3 reps;20 seconds          Trigger Point Dry Needling - 03/11/16 1159    Consent Given? Yes   Muscles Treated Upper Body Upper trapezius  left   Muscles Treated Lower Body --  medius,upper, right   Upper Trapezius Response Twitch reponse elicited;Palpable increased muscle length                PT Short Term Goals - 03/07/16 1845      PT SHORT TERM GOAL #1   Title pt will be I with inital HEP (03/13/2016)   Time 4   Period Weeks   Status Unable to assess     PT SHORT TERM GOAL #2   Time 4   Period Weeks   Status Unable to assess     PT SHORT TERM GOAL #3   Title she will demonstrate reduce muscle spasm in bil upper traps and R quadratus lumborum to decrease pain to </= 6/10 in neck /back and improve cervical and trunk mobility (03/12/2016)   Baseline spasm intermittant up to 8-9/10  (Less frequent)   Time 4   Period Weeks   Status On-going     PT SHORT TERM GOAL #4   Title she will improve cervical mobility by >/= 5 degrees in all planes with </= 5/10 pain to assist with functional progression (03/12/2016)   Time 4   Period Weeks   Status Unable to assess     PT SHORT TERM GOAL #5   Title pt will improve lumbar flexion/ extension by >/= 5 degrees and R rotation by to >/= 15% to assist with function with </= 5/10 pain (03/12/2016)   Time 4   Period Weeks   Status Unable to assess     PT SHORT TERM GOAL #6   Title assess Berg balance and make goals accordingly   Time 2   Period Weeks   Status Achieved           PT Long Term Goals - 03/02/16 1509      PT LONG TERM GOAL #1   Title pt will be I with all  HEP given as of last visit (04/07/2016)   Time 8   Period Weeks   Status On-going     PT LONG TERM GOAL #2   Title pt will increase cervical flexion /  rotation by >/= 15 degrees with </= 2/10 pain to assist with safety during driving and ADLS (98/92/1194)   Time 8   Period Weeks   Status On-going     PT LONG TERM GOAL #3   Title pt will increase bil LE strength to >/= 4/5 with </= 2/10 pain for prolong walking/ standing ADLS and promote safety (04/07/2016)   Time 8   Period Weeks   Status On-going     PT LONG TERM GOAL #4   Title pt will be able to walk/ stand for >/=30 min with </= 2/10 pain utilizing </= LRAD for safety to promote functional endurance required or ADLs (04/07/2016)   Time 8   Period Weeks     PT LONG TERM GOAL #5   Title pt will increase her FOTO score to </=55% limited to demonstrate improvement in function at discharge (04/07/2016)   Period Weeks   Status Unable to assess               Plan - 03/11/16 1243    Clinical Impression Statement Patient reports continued limitations with standing and walking due to neck and back pain. Perfromed TPDN to left glute medius and bilateral upper traps followed by myofascial release. Upon supine positioning after TPDN, pt able to lie more flat without c/o neck pain. AROM improved in cervical spine post session- see objective measures.    PT Next Visit Plan assess response  TPDN;CHECK GOALS ; manua/TPDN to calm down tightness, neck,trunk, hip stretching, GENTLE bil hip/knee strengthening/core, posture education,   Update HEp as needed   PT Home Exercise Plan clam shells, upper trap stretch, chin tucks (in supine), QL stretching, pelvic tilts, 11/20:  supine abdominal isometrics, hip stretches into flexion, bridges, Dry Needling   Consulted and Agree with Plan of Care Patient      Patient will benefit from skilled therapeutic intervention in order to improve the following deficits and impairments:  Abnormal gait, Decreased activity tolerance, Decreased balance, Decreased endurance, Pain, Hypomobility, Decreased strength, Increased fascial restricitons, Difficulty walking,  Decreased range of motion, Improper body mechanics, Postural dysfunction, Decreased mobility  Visit Diagnosis: Chronic bilateral low back pain with right-sided sciatica  Muscle weakness (generalized)  Cervicalgia  Unsteadiness on feet  Other abnormalities of gait and mobility     Problem List Patient Active Problem List   Diagnosis Date Noted  . Chronic constipation 06/23/2015  . Obesity 06/12/2015  . Chronic diastolic heart failure ([place]) 11/17/2014  . HNP (herniated nucleus pulposus), lumbar 08/22/2014    Class: Chronic  . Herniated nucleus pulposus, L2-3 right 02/04/2014    Class: Acute  . Urinary retention 05/04/2012    Class: Temporary  . Spondylolisthesis of lumbar region 04/30/2012    Class: Chronic  . Lumbar stenosis with neurogenic claudication 04/30/2012    Class: Chronic  . Chest pain 03/20/2012  . Right shoulder pain 12/07/2010  . ABDOMINAL PAIN, LOWER 09/21/2009  . BACK PAIN 03/18/2009  . SYNCOPE-CAROTID SINUS 09/16/2008  . Essential hypertension 08/05/2008  . Hypothyroidism 08/23/2006  . Diabetes with neurologic complications ([facility]) 17/40/8144  . Diabetic neuropathy ([place]) 08/23/2006  . CARDIOMYOPATHY 08/23/2006  . GERD 08/23/2006  . FATTY LIVER DISEASE 08/23/2006  . OSTEOARTHRITIS 08/23/2006  . [place] DISEASE, CERVICAL 08/23/2006  .  [place] DISEASE, LUMBAR 08/23/2006  . Headache(784.0) 08/23/2006  . BREAST CANCER, HX OF 08/23/2006  . HYPERPARATHYROIDISM, HX OF 08/23/2006  . RENAL CALCULUS, HX OF 08/23/2006    Dorene Ar, PTA 03/11/2016, 1:00 PM  [facility] [address] [place], [place], [address] Phone: [phone]   Fax:  [phone]  Name: [name] MRN: [id number] Date of Birth: [birth year]

## 2016-03-14 ENCOUNTER — Ambulatory Visit: Payer: Commercial Managed Care - HMO | Admitting: Physical Therapy

## 2016-03-14 DIAGNOSIS — R2681 Unsteadiness on feet: Secondary | ICD-10-CM

## 2016-03-14 DIAGNOSIS — R2689 Other abnormalities of gait and mobility: Secondary | ICD-10-CM | POA: Diagnosis not present

## 2016-03-14 DIAGNOSIS — G8929 Other chronic pain: Secondary | ICD-10-CM | POA: Diagnosis not present

## 2016-03-14 DIAGNOSIS — M5441 Lumbago with sciatica, right side: Principal | ICD-10-CM

## 2016-03-14 DIAGNOSIS — M6281 Muscle weakness (generalized): Secondary | ICD-10-CM | POA: Diagnosis not present

## 2016-03-14 DIAGNOSIS — M542 Cervicalgia: Secondary | ICD-10-CM | POA: Diagnosis not present

## 2016-03-14 NOTE — Patient Instructions (Signed)
Bridge    Lie back, legs bent. Inhale, pressing hips up. Keeping ribs in, lengthen lower back. Exhale, rolling down along spine from top. Repeat 10 times. Do 2 sessions per day.  http://pm.exer.us/55   Copyright  VHI. All rights reserved.   

## 2016-03-14 NOTE — Therapy (Signed)
[place], [place], [address] Phone: [phone]   Fax:  [phone]  Physical Therapy Treatment  Patient Details  Name: [name] MRN: [id number] Date of Birth: December 26, 1950 Referring Provider: Serafina Mitchell MD  Encounter Date: 03/14/2016      PT End of Session - 03/14/16 1147    Visit Number 11   Number of Visits 17   Date for PT Re-Evaluation 04/07/16   Authorization Type Medicare: Kx mod by 15th visit:  progress note by 10th visit   PT Start Time 1147   PT Stop Time 1245   PT Time Calculation (min) 58 min      Past Medical History:  Diagnosis Date  . Anginal pain ([place]) 03/19/2012   saw Dr. Cathie Olden .. she thinks its more related to stomach issues  . Arthritis    "all over; mostly in my back" (03/20/2012)  . Breast cancer ([place])    "right" (03/20/2012)  . Cardiomyopathy   . Chest pain 06/2005   Hospitalized, dystolic dysfunction  . Chronic lower back pain    "have 2 herniated discs; going to have to have a fusion" (03/20/2012)  . Family history of anesthesia complication    "father has nausea and vomiting"  . GERD (gastroesophageal reflux disease)   . H/O hiatal hernia   . History of kidney stones   . History of right mastectomy   . Hypertension    "patient states never had HTN, takes Hyzaar for heart  . Hypothyroidism   . Irritable bowel syndrome   . Migraines    "it's been a long time since I've had one" (03/20/2012)  . Osteoarthritis   . Peripheral neuropathy ([place])   . PONV (postoperative nausea and vomiting)   . Sciatica   . Type II diabetes mellitus ([place])     Past Surgical History:  Procedure Laterality Date  . ABDOMINAL HYSTERECTOMY  1993  . adenosin cardiolite  07/2005   (+) wall motion abnormality  . AXILLARY LYMPH NODE DISSECTION  2003   squamous cell cancer  . BACK SURGERY    . CARDIAC CATHETERIZATION  ? 2005 / 2007  . CT abdomen and pelvis  02/2010   Same  .  ESOPHAGOGASTRODUODENOSCOPY  2005   GERD  . KNEE ARTHROSCOPY  1990's   "right" (03/20/2012)  . LUMBAR FUSION N/A 08/22/2014   Procedure: Right L2-3 and right L1-2 transforaminal lumbar interbody fusion with pedicle screws, rods, sleeves, cages, local bone graft, Vivigen, cancellous chips;  Surgeon: Jessy Oto, MD;  Location: [facility];  Service: Orthopedics;  Laterality: N/A;  . LUMBAR LAMINECTOMY/DECOMPRESSION MICRODISCECTOMY N/A 02/04/2014   Procedure: RIGHT L2-3 MICRODISCECTOMY ;  Surgeon: Jessy Oto, MD;  Location: [facility];  Service: Orthopedics;  Laterality: N/A;  . MASTECTOMY WITH AXILLARY LYMPH NODE DISSECTION  12/2003   "right" (03/20/2012)  . POSTERIOR CERVICAL FUSION/FORAMINOTOMY  2001  . SHOULDER ARTHROSCOPY W/ ROTATOR CUFF REPAIR  2003   Left  . THYROIDECTOMY  1977   Right  . TOTAL KNEE ARTHROPLASTY  2000   Right  . [place] of abdomen  07/2005   Fatty liver / kidney stones    There were no vitals filed for this visit.      Subjective Assessment - 03/14/16 1151    Subjective I have been in pain this weekend.    Currently in Pain? Yes   Pain Score 7    Pain Location Back   Pain Orientation Lower;Mid  Pain Descriptors / Indicators --  constant pain   Aggravating Factors  standing   Pain Relieving Factors pain meds, lying down    Pain Score 8   Pain Location Neck   Pain Orientation Posterior  base of skull    Pain Descriptors / Indicators --  constant pain             [facility] PT Assessment - 03/14/16 0001      AROM   Cervical Flexion 30   Cervical Extension 30   Cervical - Right Side Bend 30   Cervical - Left Side Bend 22                     [facility] Adult PT Treatment/Exercise - 03/14/16 0001      Neck Exercises: Supine   Neck Retraction 5 reps   Neck Retraction Limitations requires 3 pillows , increased pain with retracton, nods     Lumbar Exercises: Aerobic   Stationary Bike Nustep L3 X 7 minutes     Moist Heat Therapy   Number Minutes  Moist Heat 15 Minutes   Moist Heat Location Lumbar Spine     Manual Therapy   Manual therapy comments attempted soft tissue work to sub occipitals, not tolerated, trial of tennis balls for sub occipital release tolerated for 2 minutes , no change in pain                 PT Education - 03/14/16 1207    Education provided Yes   Education Details HEP recap-bridge   Person(s) Educated Patient   Methods Explanation;Handout   Comprehension Verbalized understanding          PT Short Term Goals - 03/07/16 1845      PT SHORT TERM GOAL #1   Title pt will be I with inital HEP (03/13/2016)   Time 4   Period Weeks   Status Unable to assess     PT SHORT TERM GOAL #2   Time 4   Period Weeks   Status Unable to assess     PT SHORT TERM GOAL #3   Title she will demonstrate reduce muscle spasm in bil upper traps and R quadratus lumborum to decrease pain to </= 6/10 in neck /back and improve cervical and trunk mobility (03/12/2016)   Baseline spasm intermittant up to 8-9/10  (Less frequent)   Time 4   Period Weeks   Status On-going     PT SHORT TERM GOAL #4   Title she will improve cervical mobility by >/= 5 degrees in all planes with </= 5/10 pain to assist with functional progression (03/12/2016)   Time 4   Period Weeks   Status Unable to assess     PT SHORT TERM GOAL #5   Title pt will improve lumbar flexion/ extension by >/= 5 degrees and R rotation by to >/= 15% to assist with function with </= 5/10 pain (03/12/2016)   Time 4   Period Weeks   Status Unable to assess     PT SHORT TERM GOAL #6   Title assess Berg balance and make goals accordingly   Time 2   Period Weeks   Status Achieved           PT Long Term Goals - 03/02/16 1509      PT LONG TERM GOAL #1   Title pt will be I with all HEP given as of last visit (04/07/2016)   Time 8  Period Weeks   Status On-going     PT LONG TERM GOAL #2   Title pt will increase cervical flexion / rotation by >/= 15  degrees with </= 2/10 pain to assist with safety during driving and ADLS (02/77/4128)   Time 8   Period Weeks   Status On-going     PT LONG TERM GOAL #3   Title pt will increase bil LE strength to >/= 4/5 with </= 2/10 pain for prolong walking/ standing ADLS and promote safety (04/07/2016)   Time 8   Period Weeks   Status On-going     PT LONG TERM GOAL #4   Title pt will be able to walk/ stand for >/=30 min with </= 2/10 pain utilizing </= LRAD for safety to promote functional endurance required or ADLs (04/07/2016)   Time 8   Period Weeks     PT LONG TERM GOAL #5   Title pt will increase her FOTO score to </=55% limited to demonstrate improvement in function at discharge (04/07/2016)   Period Weeks   Status Unable to assess               Plan - 03/14/16 1154    Clinical Impression Statement Pt reports relief in hip and left side of neck after dry needling however her headache in base of skull has intensified and her mid low back pain has also increased over the weekend. She has taken over the counter and prescription meds without relief. Heat pad relieved posterior neck pain while applied and returned when heat removed.     PT Next Visit Plan CHECK GOALS; suboccipital TPDN? supine chin tucks, did she try tennis balls at home? gentle; core/hip strength; posuture    PT Home Exercise Plan clam shells, upper trap stretch, chin tucks (in supine), QL stretching, pelvic tilts, 11/20:  supine abdominal isometrics, hip stretches into flexion, bridges, Dry Needling   Consulted and Agree with Plan of Care Patient      Patient will benefit from skilled therapeutic intervention in order to improve the following deficits and impairments:  Abnormal gait, Decreased activity tolerance, Decreased balance, Decreased endurance, Pain, Hypomobility, Decreased strength, Increased fascial restricitons, Difficulty walking, Decreased range of motion, Improper body mechanics, Postural dysfunction,  Decreased mobility  Visit Diagnosis: Chronic bilateral low back pain with right-sided sciatica  Muscle weakness (generalized)  Cervicalgia  Unsteadiness on feet  Other abnormalities of gait and mobility     Problem List Patient Active Problem List   Diagnosis Date Noted  . Chronic constipation 06/23/2015  . Obesity 06/12/2015  . Chronic diastolic heart failure ([place]) 11/17/2014  . HNP (herniated nucleus pulposus), lumbar 08/22/2014    Class: Chronic  . Herniated nucleus pulposus, L2-3 right 02/04/2014    Class: Acute  . Urinary retention 05/04/2012    Class: Temporary  . Spondylolisthesis of lumbar region 04/30/2012    Class: Chronic  . Lumbar stenosis with neurogenic claudication 04/30/2012    Class: Chronic  . Chest pain 03/20/2012  . Right shoulder pain 12/07/2010  . ABDOMINAL PAIN, LOWER 09/21/2009  . BACK PAIN 03/18/2009  . SYNCOPE-CAROTID SINUS 09/16/2008  . Essential hypertension 08/05/2008  . Hypothyroidism 08/23/2006  . Diabetes with neurologic complications ([place]) 78/67/6720  . Diabetic neuropathy ([place]) 08/23/2006  . CARDIOMYOPATHY 08/23/2006  . GERD 08/23/2006  . FATTY LIVER DISEASE 08/23/2006  . OSTEOARTHRITIS 08/23/2006  . [place] DISEASE, CERVICAL 08/23/2006  . [place] DISEASE, LUMBAR 08/23/2006  . Headache(784.0) 08/23/2006  . BREAST CANCER, HX OF  08/23/2006  . HYPERPARATHYROIDISM, HX OF 08/23/2006  . RENAL CALCULUS, HX OF 08/23/2006    Dorene Ar, PTA 03/14/2016, 2:56 PM  [facility] [address] [place], [place], [address] Phone: [phone]   Fax:  [phone]  Name: [name] MRN: [id number] Date of Birth: [birth year]

## 2016-03-16 ENCOUNTER — Ambulatory Visit: Payer: Commercial Managed Care - HMO | Admitting: Physical Therapy

## 2016-03-16 DIAGNOSIS — M542 Cervicalgia: Secondary | ICD-10-CM

## 2016-03-16 DIAGNOSIS — G8929 Other chronic pain: Secondary | ICD-10-CM | POA: Diagnosis not present

## 2016-03-16 DIAGNOSIS — R2689 Other abnormalities of gait and mobility: Secondary | ICD-10-CM | POA: Diagnosis not present

## 2016-03-16 DIAGNOSIS — M6281 Muscle weakness (generalized): Secondary | ICD-10-CM | POA: Diagnosis not present

## 2016-03-16 DIAGNOSIS — R2681 Unsteadiness on feet: Secondary | ICD-10-CM

## 2016-03-16 DIAGNOSIS — M5441 Lumbago with sciatica, right side: Principal | ICD-10-CM

## 2016-03-16 NOTE — Therapy (Signed)
[place], [place], [address] Phone: [phone]   Fax:  [phone]  Physical Therapy Treatment  Patient Details  Name: [name] MRN: [id number] Date of Birth: April 13, 1950 Referring Provider: Serafina Mitchell MD  Encounter Date: 03/16/2016      PT End of Session - 03/16/16 1259    Visit Number 12   Number of Visits 17   Date for PT Re-Evaluation 04/07/16   Authorization Type Medicare: Kx mod by 15th visit:  progress note by 10th visit   PT Start Time 1145   PT Stop Time 1237   PT Time Calculation (min) 52 min   Activity Tolerance Patient tolerated treatment well   Behavior During Therapy [facility] for tasks assessed/performed      Past Medical History:  Diagnosis Date  . Anginal pain ([place]) 03/19/2012   saw Dr. Cathie Olden .. she thinks its more related to stomach issues  . Arthritis    "all over; mostly in my back" (03/20/2012)  . Breast cancer ([place])    "right" (03/20/2012)  . Cardiomyopathy   . Chest pain 06/2005   Hospitalized, dystolic dysfunction  . Chronic lower back pain    "have 2 herniated discs; going to have to have a fusion" (03/20/2012)  . Family history of anesthesia complication    "father has nausea and vomiting"  . GERD (gastroesophageal reflux disease)   . H/O hiatal hernia   . History of kidney stones   . History of right mastectomy   . Hypertension    "patient states never had HTN, takes Hyzaar for heart  . Hypothyroidism   . Irritable bowel syndrome   . Migraines    "it's been a long time since I've had one" (03/20/2012)  . Osteoarthritis   . Peripheral neuropathy ([place])   . PONV (postoperative nausea and vomiting)   . Sciatica   . Type II diabetes mellitus ([place])     Past Surgical History:  Procedure Laterality Date  . ABDOMINAL HYSTERECTOMY  1993  . adenosin cardiolite  07/2005   (+) wall motion abnormality  . AXILLARY LYMPH NODE DISSECTION  2003   squamous cell  cancer  . BACK SURGERY    . CARDIAC CATHETERIZATION  ? 2005 / 2007  . CT abdomen and pelvis  02/2010   Same  . ESOPHAGOGASTRODUODENOSCOPY  2005   GERD  . KNEE ARTHROSCOPY  1990's   "right" (03/20/2012)  . LUMBAR FUSION N/A 08/22/2014   Procedure: Right L2-3 and right L1-2 transforaminal lumbar interbody fusion with pedicle screws, rods, sleeves, cages, local bone graft, Vivigen, cancellous chips;  Surgeon: Jessy Oto, MD;  Location: [place];  Service: Orthopedics;  Laterality: N/A;  . LUMBAR LAMINECTOMY/DECOMPRESSION MICRODISCECTOMY N/A 02/04/2014   Procedure: RIGHT L2-3 MICRODISCECTOMY ;  Surgeon: Jessy Oto, MD;  Location: [place];  Service: Orthopedics;  Laterality: N/A;  . MASTECTOMY WITH AXILLARY LYMPH NODE DISSECTION  12/2003   "right" (03/20/2012)  . POSTERIOR CERVICAL FUSION/FORAMINOTOMY  2001  . SHOULDER ARTHROSCOPY W/ ROTATOR CUFF REPAIR  2003   Left  . THYROIDECTOMY  1977   Right  . TOTAL KNEE ARTHROPLASTY  2000   Right  . [place] of abdomen  07/2005   Fatty liver / kidney stones    There were no vitals filed for this visit.      Subjective Assessment - 03/16/16 1151    Subjective (P)  "I havent been able to get sleep fore more then 3 hours  due to my neck pain"   Currently in Pain? (P)  Yes   Pain Score (P)  3    Pain Location (P)  Back   Pain Orientation (P)  Lower;Mid   Pain Type (P)  Chronic pain   Pain Onset (P)  More than a month ago   Pain Frequency (P)  Intermittent   Pain Score (P)  9   Pain Location (P)  Neck   Pain Type (P)  Chronic pain   Pain Onset (P)  More than a month ago            [facility] PT Assessment - 03/16/16 0001      AROM   Cervical Flexion 34   Cervical Extension 30   Cervical - Right Side Bend 30   Cervical - Left Side Bend 25   Cervical - Right Rotation 40   Cervical - Left Rotation 32                     [facility] Adult PT Treatment/Exercise - 03/16/16 0001      Moist Heat Therapy   Number Minutes Moist Heat 10  Minutes   Moist Heat Location Lumbar Spine;Cervical     Manual Therapy   Manual Therapy Joint mobilization   Joint Mobilization grade 3 PA C1-C5, Grade 3 PA T1-T7    Soft tissue mobilization IASTM over bil sub-occipitals, and bil cervical paraspinals   Myofascial Release fascial rolling/ stretching on bil cervical paraspinals and sub-occipitals          Trigger Point Dry Needling - 03/16/16 1255    Consent Given? Yes   Education Handout Provided Yes  given peviously   Muscles Treated Upper Body Suboccipitals muscle group   SubOccipitals Response Twitch response elicited;Palpable increased muscle length   Longissimus Response Twitch response elicited;Palpable increased muscle length  cervical multifidus at C3-5 bil                PT Short Term Goals - 03/16/16 1302      PT SHORT TERM GOAL #1   Title pt will be I with inital HEP (03/13/2016)   Time 4   Period Weeks   Status Partially Met     PT SHORT TERM GOAL #2   Title she will be able to verbalize and demo proper posture and lifting/ carrying mechanics to prevent and reduce neck and low back pain (03/13/2016)   Time 4   Period Weeks   Status On-going     PT SHORT TERM GOAL #3   Title she will demonstrate reduce muscle spasm in bil upper traps and R quadratus lumborum to decrease pain to </= 6/10 in neck /back and improve cervical and trunk mobility (03/12/2016)   Period Weeks   Status Partially Met     PT SHORT TERM GOAL #4   Title she will improve cervical mobility by >/= 5 degrees in all planes with </= 5/10 pain to assist with functional progression (03/12/2016)   Time 4   Period Weeks     PT SHORT TERM GOAL #5   Title pt will improve lumbar flexion/ extension by >/= 5 degrees and R rotation by to >/= 15% to assist with function with </= 5/10 pain (03/12/2016)   Time 4   Period Weeks   Status Partially Met           PT Long Term Goals - 03/16/16 1304      PT LONG TERM GOAL #1  Title pt will be I  with all HEP given as of last visit (04/07/2016)   Time 8   Period Weeks   Status On-going     PT LONG TERM GOAL #2   Title pt will increase cervical flexion / rotation by >/= 15 degrees with </= 2/10 pain to assist with safety during driving and ADLS (66/59/9357)   Time 8   Period Weeks   Status On-going     PT LONG TERM GOAL #3   Title pt will increase bil LE strength to >/= 4/5 with </= 2/10 pain for prolong walking/ standing ADLS and promote safety (04/07/2016)   Time 8   Period Weeks   Status On-going     PT LONG TERM GOAL #4   Title pt will be able to walk/ stand for >/=30 min with </= 2/10 pain utilizing </= LRAD for safety to promote functional endurance required or ADLs (04/07/2016)   Time 8   Period Weeks   Status On-going     PT LONG TERM GOAL #5   Title pt will increase her FOTO score to </=55% limited to demonstrate improvement in function at discharge (04/07/2016)   Time 8   Period Weeks   Status Unable to assess     PT LONG TERM GOAL #6   Title She will increase Her BERG balance by >/= 50/56 for safety during walking/ standing activities with LRAD    Time 7   Period Weeks   Status Unable to assess               Plan - 03/16/16 1259    Clinical Impression Statement today she reports 9/10 pain in the neck. Focused on manual today for the neck to calm down HA and muscle soreness. DN was performed on bil sub-occipitals and cervical paraspinals at C3-C5. perofrmed cervical mobs and soft tissue techniques which she reported relief of tightness and pain as well as reduced HA. continued MHP to calm down soreness from DN which she reported pain at 5/10 from inital 9/10.    PT Next Visit Plan CHECK GOALS; suboccipital TPDN? supine chin tucks, did she try tennis balls at home? gentle; core/hip strength; posuture       Patient will benefit from skilled therapeutic intervention in order to improve the following deficits and impairments:  Abnormal gait, Decreased  activity tolerance, Decreased balance, Decreased endurance, Pain, Hypomobility, Decreased strength, Increased fascial restricitons, Difficulty walking, Decreased range of motion, Improper body mechanics, Postural dysfunction, Decreased mobility  Visit Diagnosis: Chronic bilateral low back pain with right-sided sciatica  Muscle weakness (generalized)  Cervicalgia  Unsteadiness on feet  Other abnormalities of gait and mobility     Problem List Patient Active Problem List   Diagnosis Date Noted  . Chronic constipation 06/23/2015  . Obesity 06/12/2015  . Chronic diastolic heart failure ([place]) 11/17/2014  . HNP (herniated nucleus pulposus), lumbar 08/22/2014    Class: Chronic  . Herniated nucleus pulposus, L2-3 right 02/04/2014    Class: Acute  . Urinary retention 05/04/2012    Class: Temporary  . Spondylolisthesis of lumbar region 04/30/2012    Class: Chronic  . Lumbar stenosis with neurogenic claudication 04/30/2012    Class: Chronic  . Chest pain 03/20/2012  . Right shoulder pain 12/07/2010  . ABDOMINAL PAIN, LOWER 09/21/2009  . BACK PAIN 03/18/2009  . SYNCOPE-CAROTID SINUS 09/16/2008  . Essential hypertension 08/05/2008  . Hypothyroidism 08/23/2006  . Diabetes with neurologic complications ([place]) 01/77/9390  . Diabetic neuropathy (  [place]) 08/23/2006  . CARDIOMYOPATHY 08/23/2006  . GERD 08/23/2006  . FATTY LIVER DISEASE 08/23/2006  . OSTEOARTHRITIS 08/23/2006  . [place] DISEASE, CERVICAL 08/23/2006  . [place] DISEASE, LUMBAR 08/23/2006  . Headache(784.0) 08/23/2006  . BREAST CANCER, HX OF 08/23/2006  . HYPERPARATHYROIDISM, HX OF 08/23/2006  . RENAL CALCULUS, HX OF 08/23/2006   [place] PT, DPT, LAT, ATC  03/16/16  1:07 PM      [facility] [address] [place], [place], [address] Phone: [phone]   Fax:  [phone]  Name: [name] MRN: [id number] Date of Birth: 1950/09/23

## 2016-03-18 ENCOUNTER — Ambulatory Visit (INDEPENDENT_AMBULATORY_CARE_PROVIDER_SITE_OTHER): Payer: Commercial Managed Care - HMO | Admitting: Specialist

## 2016-03-18 ENCOUNTER — Encounter (INDEPENDENT_AMBULATORY_CARE_PROVIDER_SITE_OTHER): Payer: Self-pay | Admitting: Specialist

## 2016-03-18 VITALS — BP 187/88 | HR 64 | Ht 65.0 in | Wt 193.0 lb

## 2016-03-18 DIAGNOSIS — Z981 Arthrodesis status: Secondary | ICD-10-CM | POA: Diagnosis not present

## 2016-03-18 DIAGNOSIS — M5416 Radiculopathy, lumbar region: Secondary | ICD-10-CM

## 2016-03-18 DIAGNOSIS — M4802 Spinal stenosis, cervical region: Secondary | ICD-10-CM | POA: Diagnosis not present

## 2016-03-18 DIAGNOSIS — M25531 Pain in right wrist: Secondary | ICD-10-CM

## 2016-03-18 DIAGNOSIS — G5601 Carpal tunnel syndrome, right upper limb: Secondary | ICD-10-CM | POA: Diagnosis not present

## 2016-03-18 NOTE — Patient Instructions (Addendum)
    Use night splint right wrist for 2 weeks than as needed. Take vitamin B complex tablet daily Take Ufish oil or creel oil. Avoid bending, stooping and avoid lifting weights greater than 10 lbs. Avoid prolong standing and walking. Avoid frequent bending and stooping  No lifting greater than 10 lbs. May use ice or moist heat for pain. Weight loss is of benefit. Handicap license is approved.

## 2016-03-18 NOTE — Progress Notes (Addendum)
Office Visit Note   [name]           Date of Birth: [birth year]           MRN: [id number] Visit Date: 03/18/2016              Requested by: Susy Frizzle, MD [address] [facility] [place], [place] [address] PCP: Odette Fraction, MD   Assessment & Plan: Visit Diagnoses:  1. Right lumbar radiculopathy   2. Pain in right wrist   3. Carpal tunnel syndrome, right upper limb   4. History of lumbar spinal fusion   5. Spinal stenosis of cervical region     Plan: Use night splint right wrist for 2 weeks than as needed. Take vitamin B complex tablet daily Take fish oil or creel oil. Avoid bending, stooping and avoid lifting weights greater than 10 lbs. Avoid prolong standing and walking. Avoid frequent bending and stooping  No lifting greater than 10 lbs. May use ice or moist heat for pain. Weight loss is of benefit. Handicap license is approved.   Follow-Up Instructions: Return in about 4 weeks (around 04/15/2016). To reassess carpal tunnel treatment. Do not let numbness in hand become constant before return appointment, call if this is happening.   Orders:  No orders of the defined types were placed in this encounter.  No orders of the defined types were placed in this encounter.     Procedures: No procedures performed   Clinical Data: Findings:  EMG/NCV right arm and right leg. Right arm with moderate CTS changes no motor involvement. Right leg with L4 and L5 radiculopathy that is chronic. MRI with moderate stenosis at C3-4 with right greater than left anterior thecal sac compression moderate biforamenal narrowing, cord signal change at the right C4-5 site of previous ACDF. No cord compression or nerve root compression at C4-5.    Subjective: Chief Complaint  Patient presents with  . Neck - Pain  . Lower Back - Pain    Patient returns to follow up with her emg/ncs. States she has went to physical therapy 3 x a week for a month. States this has  helped some. Patient does complain with keeping headache for the past 3-4 weeks. Also complains with her back pain radiating into right leg and at times into left hip.Complains of right hand numbness, increased symptoms of numbness and paresthesias into the right hand, all fingers of the right hand. Hand triggering of a finger in the past. Dr. Saintclair Halsted did ACDF in 2001 C4-5. MRI recently with DDD at the C3-4 level with moderate stenosis, no cord signal changes. Complains of some off balance and coordination. No Lermittee's symptoms. Increase clumsiness with the right hand.     Review of Systems  Constitutional: Negative.   HENT: Positive for congestion and rhinorrhea.   Eyes: Negative.   Respiratory: Positive for cough and wheezing.   Cardiovascular: Negative.   Gastrointestinal: Negative.   Genitourinary: Negative.   Musculoskeletal: Positive for back pain, gait problem, neck pain and neck stiffness.  Skin: Negative.   Neurological: Positive for headaches.  Hematological: Negative.   Psychiatric/Behavioral: Negative.      Objective: Vital Signs: BP (!) 187/88   Pulse 64   Ht 5\' 5"  (1.651 m)   Wt 193 lb (87.5 kg)   BMI 32.12 kg/m   Physical Exam  Constitutional: She is oriented to person, place, and time. She appears well-developed and well-nourished.  HENT:  Head: Normocephalic and  atraumatic.  Eyes: EOM are normal. Pupils are equal, round, and reactive to light.  Neck: Neck supple.  Pulmonary/Chest: Effort normal and breath sounds normal.  Abdominal: Soft. Bowel sounds are normal.  Musculoskeletal: She exhibits tenderness. She exhibits no deformity.  Neurological: She is alert and oriented to person, place, and time.  Skin: Skin is warm and dry.  Psychiatric: She has a normal mood and affect. Her behavior is normal. Judgment and thought content normal.    Back Exam   Tenderness  The patient is experiencing tenderness in the lumbar and cervical.  Range of Motion    Extension: abnormal  Flexion: abnormal  Lateral Bend Right: abnormal  Lateral Bend Left: abnormal  Rotation Right: abnormal  Rotation Left: abnormal   Muscle Strength  Right Quadriceps:  4/5  Left Quadriceps:  5/5  Right Hamstrings:  5/5  Left Hamstrings:  5/5   Tests  Straight leg raise right: negative Straight leg raise left: negative  Reflexes  Patellar: abnormal Achilles: normal Biceps: normal Babinski's sign: normal   Other  Toe Walk: abnormal Heel Walk: abnormal Sensation: normal Gait: abnormal  Erythema: no back redness Scars: present   Right Hand Exam  Right hand exam is normal.  Tenderness  The patient is experiencing tenderness in the radial area.  Range of Motion   Wrist  Extension: normal  Flexion: normal  Pronation: normal  Supination: normal   Tests  Phalen's Sign: positive Tinel's Sign (Medial Nerve): positive  Other  Erythema: absent Scars: absent      Specialty Comments:  No specialty comments available.  Imaging: No results found.   PMFS History: Patient Active Problem List   Diagnosis Date Noted  . HNP (herniated nucleus pulposus), lumbar 08/22/2014    Priority: High    Class: Chronic  . Herniated nucleus pulposus, L2-3 right 02/04/2014    Priority: High    Class: Acute  . Lumbar stenosis with neurogenic claudication 04/30/2012    Priority: High    Class: Chronic  . Chronic constipation 06/23/2015  . Obesity 06/12/2015  . Chronic diastolic heart failure ([place]) 11/17/2014  . Urinary retention 05/04/2012    Class: Temporary  . Spondylolisthesis of lumbar region 04/30/2012    Class: Chronic  . Chest pain 03/20/2012  . Right shoulder pain 12/07/2010  . ABDOMINAL PAIN, LOWER 09/21/2009  . BACK PAIN 03/18/2009  . SYNCOPE-CAROTID SINUS 09/16/2008  . Essential hypertension 08/05/2008  . Hypothyroidism 08/23/2006  . Diabetes with neurologic complications ([place]) 74/82/7078  . Diabetic neuropathy ([place]) 08/23/2006  .  CARDIOMYOPATHY 08/23/2006  . GERD 08/23/2006  . FATTY LIVER DISEASE 08/23/2006  . OSTEOARTHRITIS 08/23/2006  . [place] DISEASE, CERVICAL 08/23/2006  . [place] DISEASE, LUMBAR 08/23/2006  . Headache(784.0) 08/23/2006  . BREAST CANCER, HX OF 08/23/2006  . HYPERPARATHYROIDISM, HX OF 08/23/2006  . RENAL CALCULUS, HX OF 08/23/2006   Past Medical History:  Diagnosis Date  . Anginal pain ([place]) 03/19/2012   saw Dr. Cathie Olden .. she thinks its more related to stomach issues  . Arthritis    "all over; mostly in my back" (03/20/2012)  . Breast cancer ([place])    "right" (03/20/2012)  . Cardiomyopathy   . Chest pain 06/2005   Hospitalized, dystolic dysfunction  . Chronic lower back pain    "have 2 herniated discs; going to have to have a fusion" (03/20/2012)  . Family history of anesthesia complication    "father has nausea and vomiting"  . GERD (gastroesophageal reflux disease)   . H/O hiatal  hernia   . History of kidney stones   . History of right mastectomy   . Hypertension    "patient states never had HTN, takes Hyzaar for heart  . Hypothyroidism   . Irritable bowel syndrome   . Migraines    "it's been a long time since I've had one" (03/20/2012)  . Osteoarthritis   . Peripheral neuropathy ([place])   . PONV (postoperative nausea and vomiting)   . Sciatica   . Type II diabetes mellitus ([facility])     Family History  Problem Relation Age of Onset  . Hypertension Mother   . Diabetes Father   . Hypertension Father   . Hypertension Sister   . Hypertension Brother   . Hypertension Brother   . Diabetes Brother   . Diabetes Other     Family Hx. of Diabetes  . Hypertension Other     Family History of HTN    Past Surgical History:  Procedure Laterality Date  . ABDOMINAL HYSTERECTOMY  1993  . adenosin cardiolite  07/2005   (+) wall motion abnormality  . AXILLARY LYMPH NODE DISSECTION  2003   squamous cell cancer  . BACK SURGERY    . CARDIAC CATHETERIZATION  ? 2005 / 2007  . CT abdomen and  pelvis  02/2010   Same  . ESOPHAGOGASTRODUODENOSCOPY  2005   GERD  . KNEE ARTHROSCOPY  1990's   "right" (03/20/2012)  . LUMBAR FUSION N/A 08/22/2014   Procedure: Right L2-3 and right L1-2 transforaminal lumbar interbody fusion with pedicle screws, rods, sleeves, cages, local bone graft, Vivigen, cancellous chips;  Surgeon: Jessy Oto, MD;  Location: [place];  Service: Orthopedics;  Laterality: N/A;  . LUMBAR LAMINECTOMY/DECOMPRESSION MICRODISCECTOMY N/A 02/04/2014   Procedure: RIGHT L2-3 MICRODISCECTOMY ;  Surgeon: Jessy Oto, MD;  Location: [facility];  Service: Orthopedics;  Laterality: N/A;  . MASTECTOMY WITH AXILLARY LYMPH NODE DISSECTION  12/2003   "right" (03/20/2012)  . POSTERIOR CERVICAL FUSION/FORAMINOTOMY  2001  . SHOULDER ARTHROSCOPY W/ ROTATOR CUFF REPAIR  2003   Left  . THYROIDECTOMY  1977   Right  . TOTAL KNEE ARTHROPLASTY  2000   Right  . [place] of abdomen  07/2005   Fatty liver / kidney stones   Social History   Occupational History  . Retired since 2003    Social History Main Topics  . Smoking status: Never Smoker  . Smokeless tobacco: Never Used  . Alcohol use No  . Drug use: No  . Sexual activity: Not on file

## 2016-03-22 IMAGING — CR DG LUMBAR SPINE COMPLETE 4+V
5 series · 5 of 5 positions shown · non-contrast
Comparison: Intraoperative films on 04/30/2012 as well as abdominal
film on 05/02/2012.

CLINICAL DATA: Low back pain with radicular symptoms of pain and
numbness in the right lower extremity.

EXAM:
LUMBAR SPINE - COMPLETE 4+ VIEW

[t l-spine a.p.]
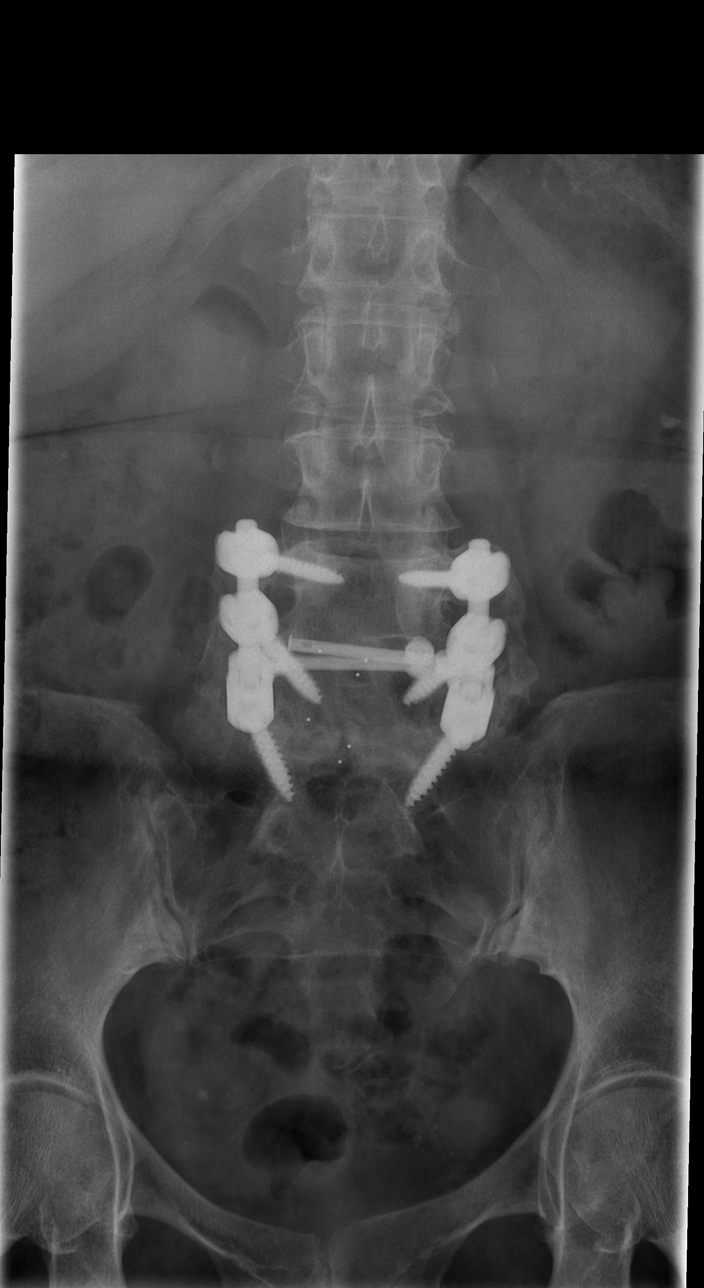

[t l-spine oblique exposure (1 of 2)]
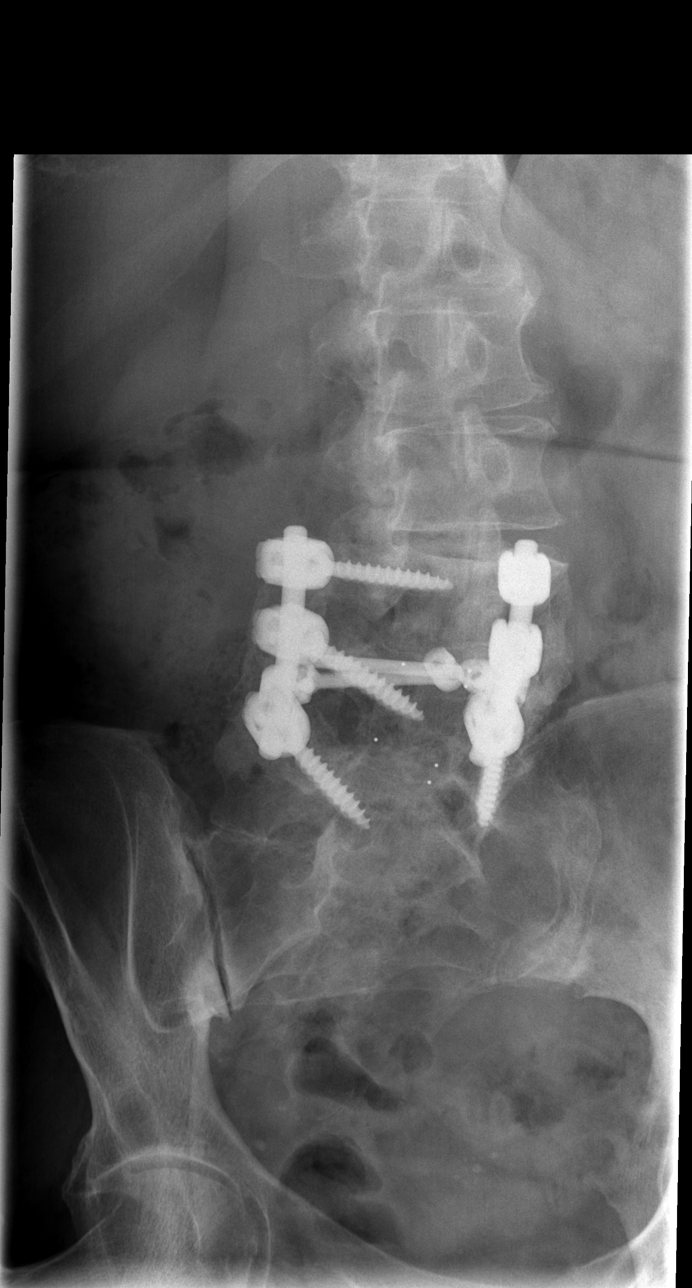

[t l-spine oblique exposure (2 of 2)]
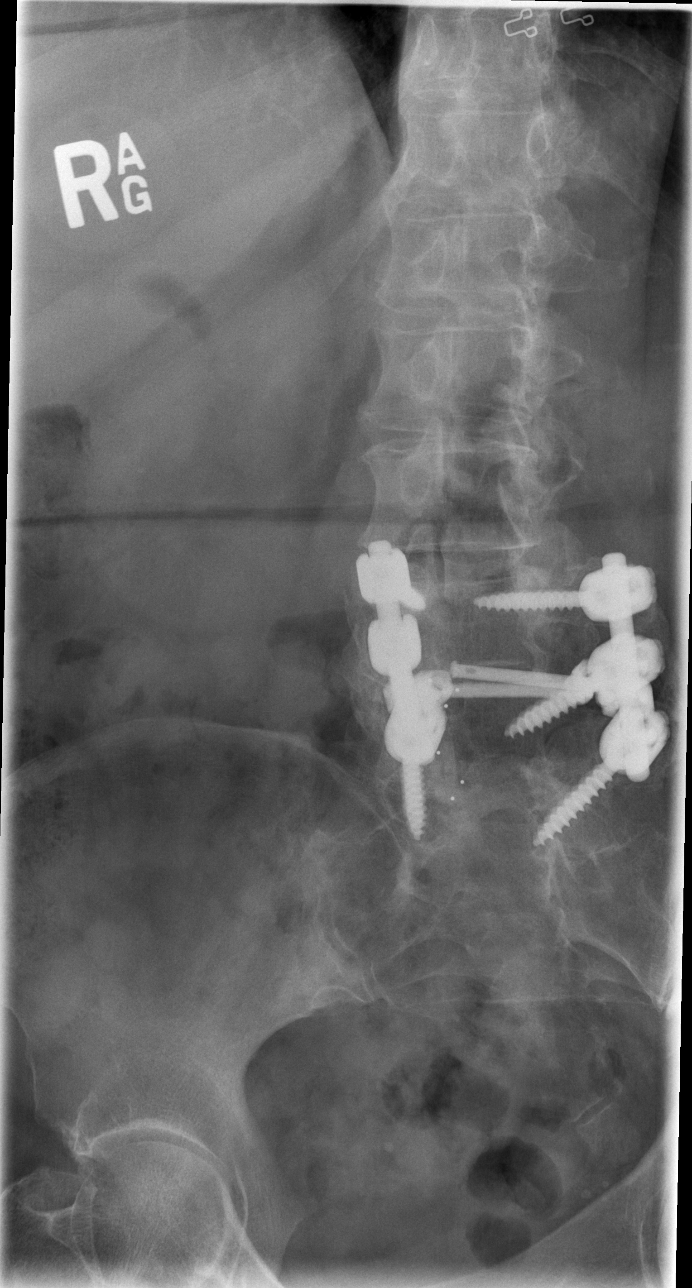

[t l-spine lat]
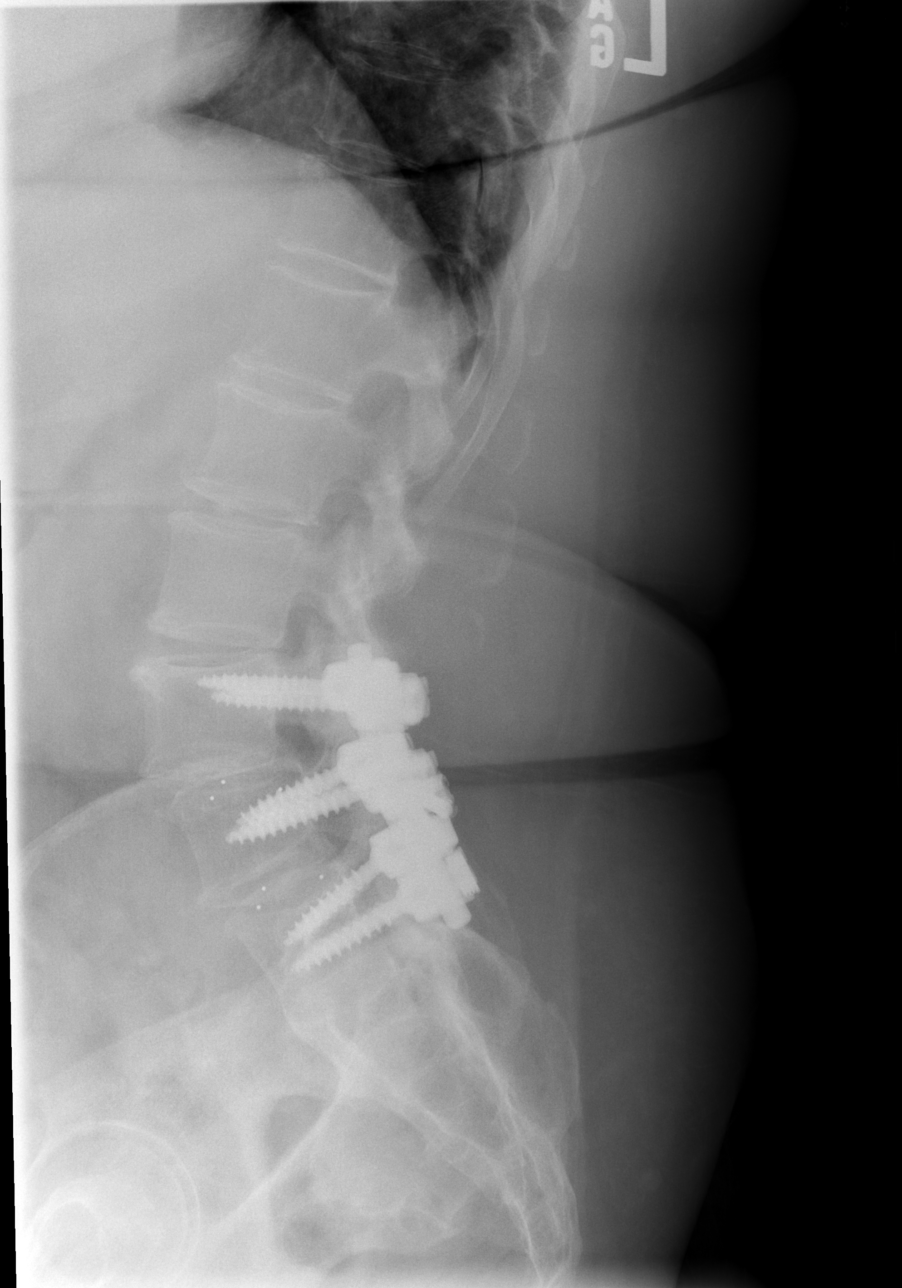

[t l-spine l5-s1 spot]
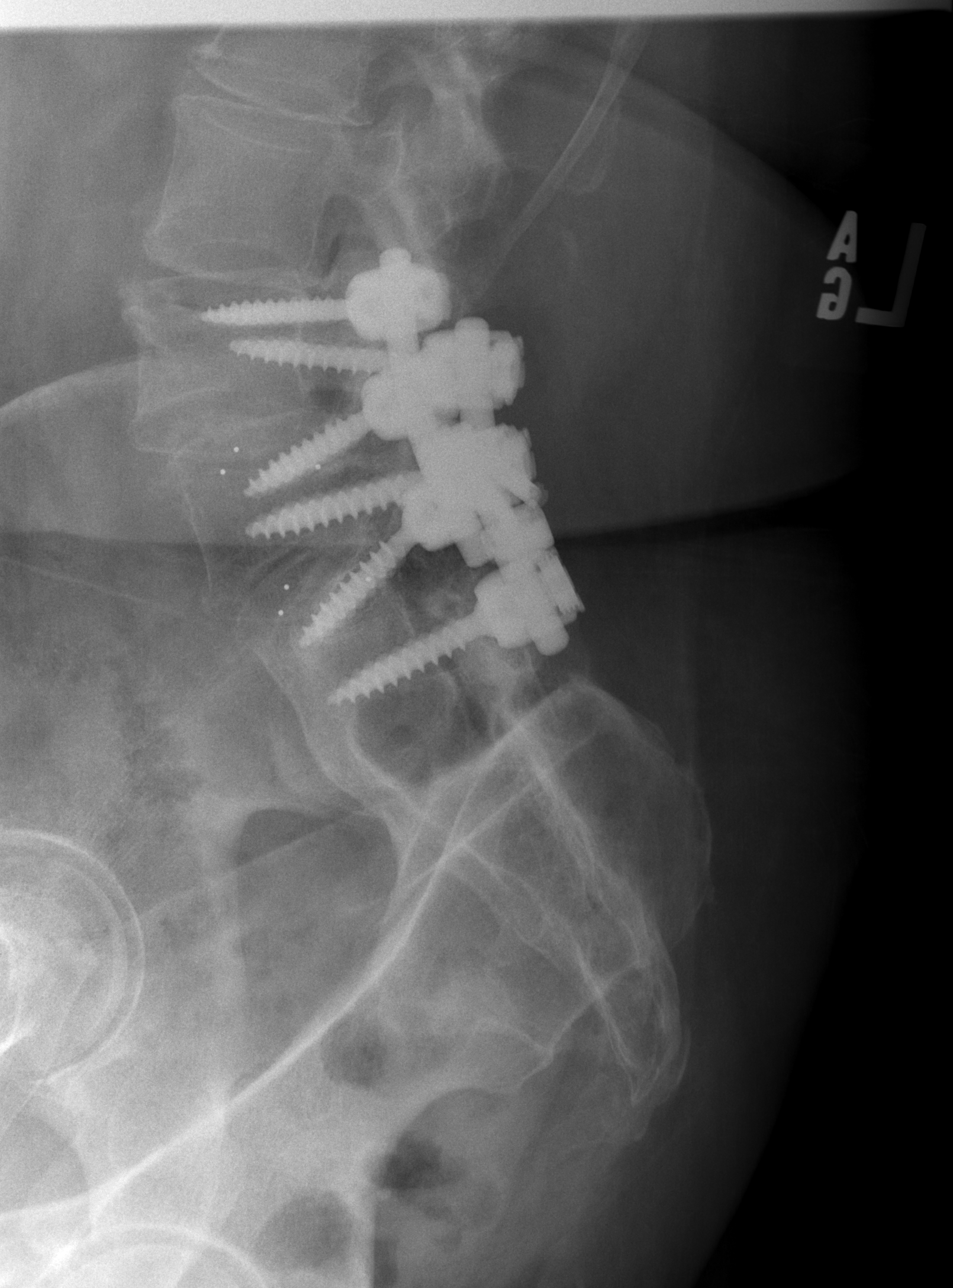

[5 of 5 positions shown; findings below may reference images not displayed]

FINDINGS: Posterior lumbar fusion hardware at the L4-5 and L5-S1 levels
appears stable with no evidence of hardware failure or abnormal
lucency surrounding hardware. Mild residual anterolisthesis of L4 on
L5 noted of approximately 4-5 mm. No evidence of acute fracture.
Mild degenerative changes are present at L3-4.
IMPRESSION: Stable appearance post PLIF at L4-5 and L5-S1. Mild anterolisthesis
present of L4 on L5.

## 2016-03-22 IMAGING — CR DG KNEE COMPLETE 4+V*R*
4 series · 4 of 4 positions shown · non-contrast
Comparison: None.

CLINICAL DATA: Right knee pain

EXAM:
RIGHT KNEE - COMPLETE 4+ VIEW

[t knee ap right]
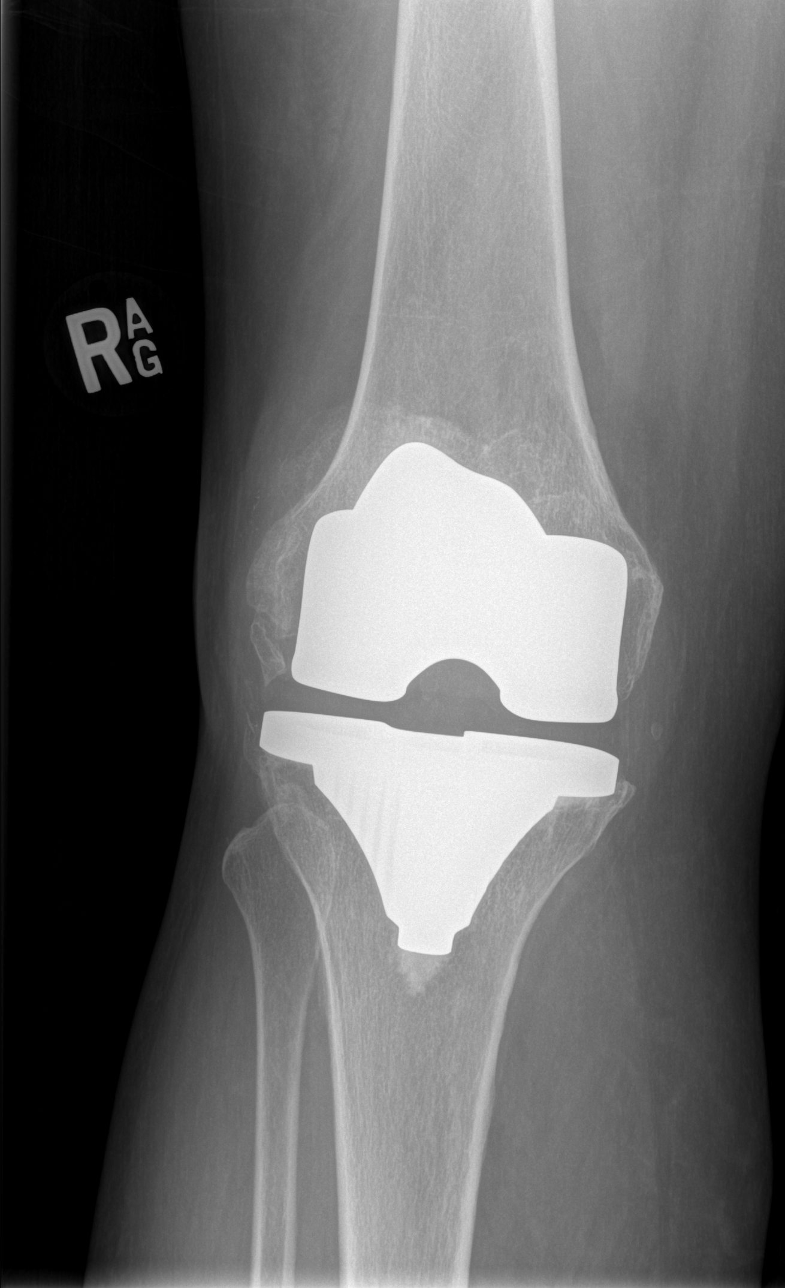

[t knee oblique right (1 of 2)]
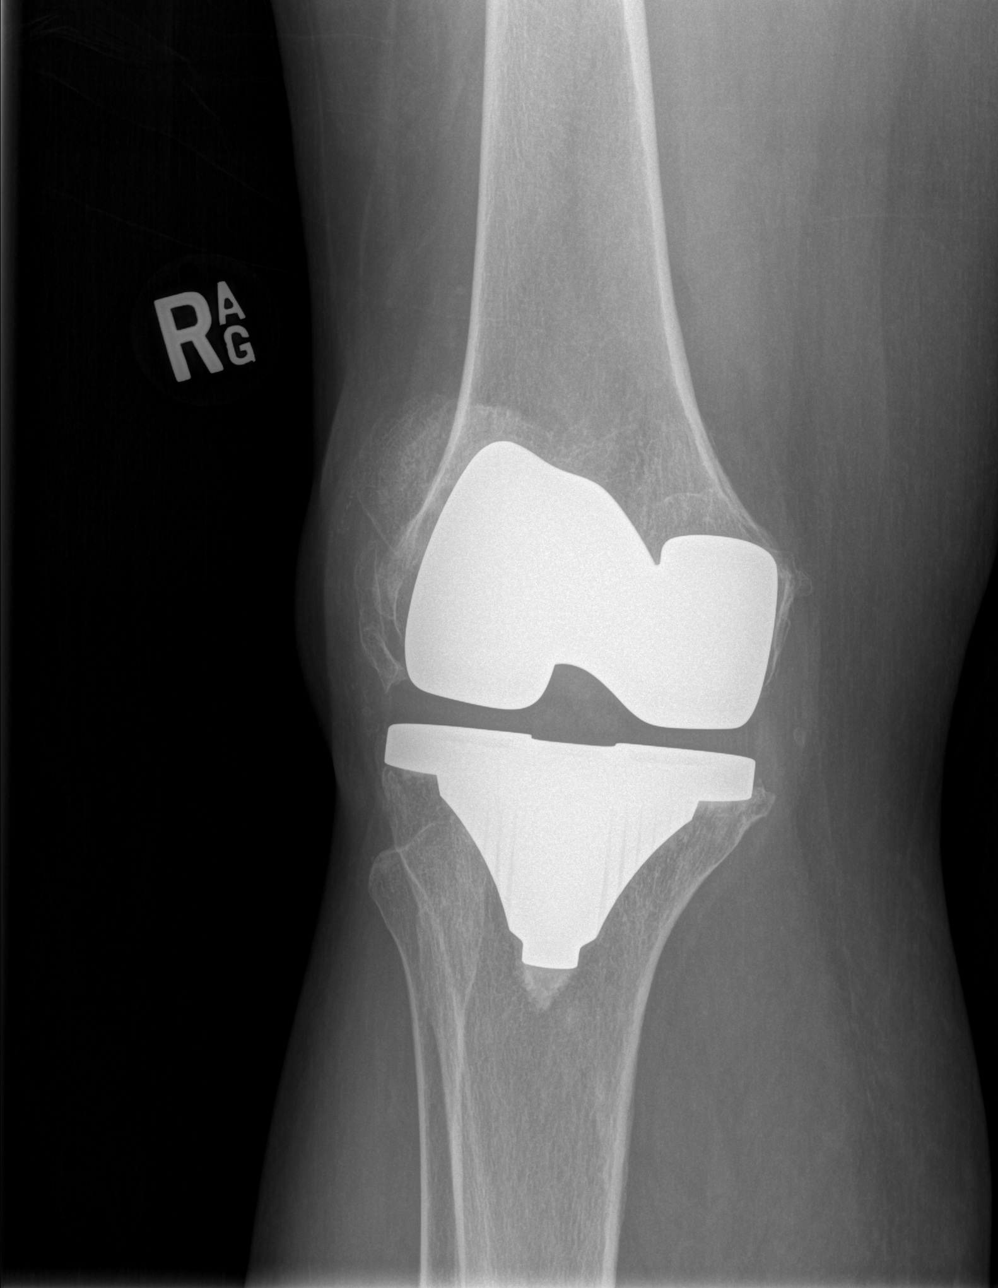

[t knee oblique right (2 of 2)]
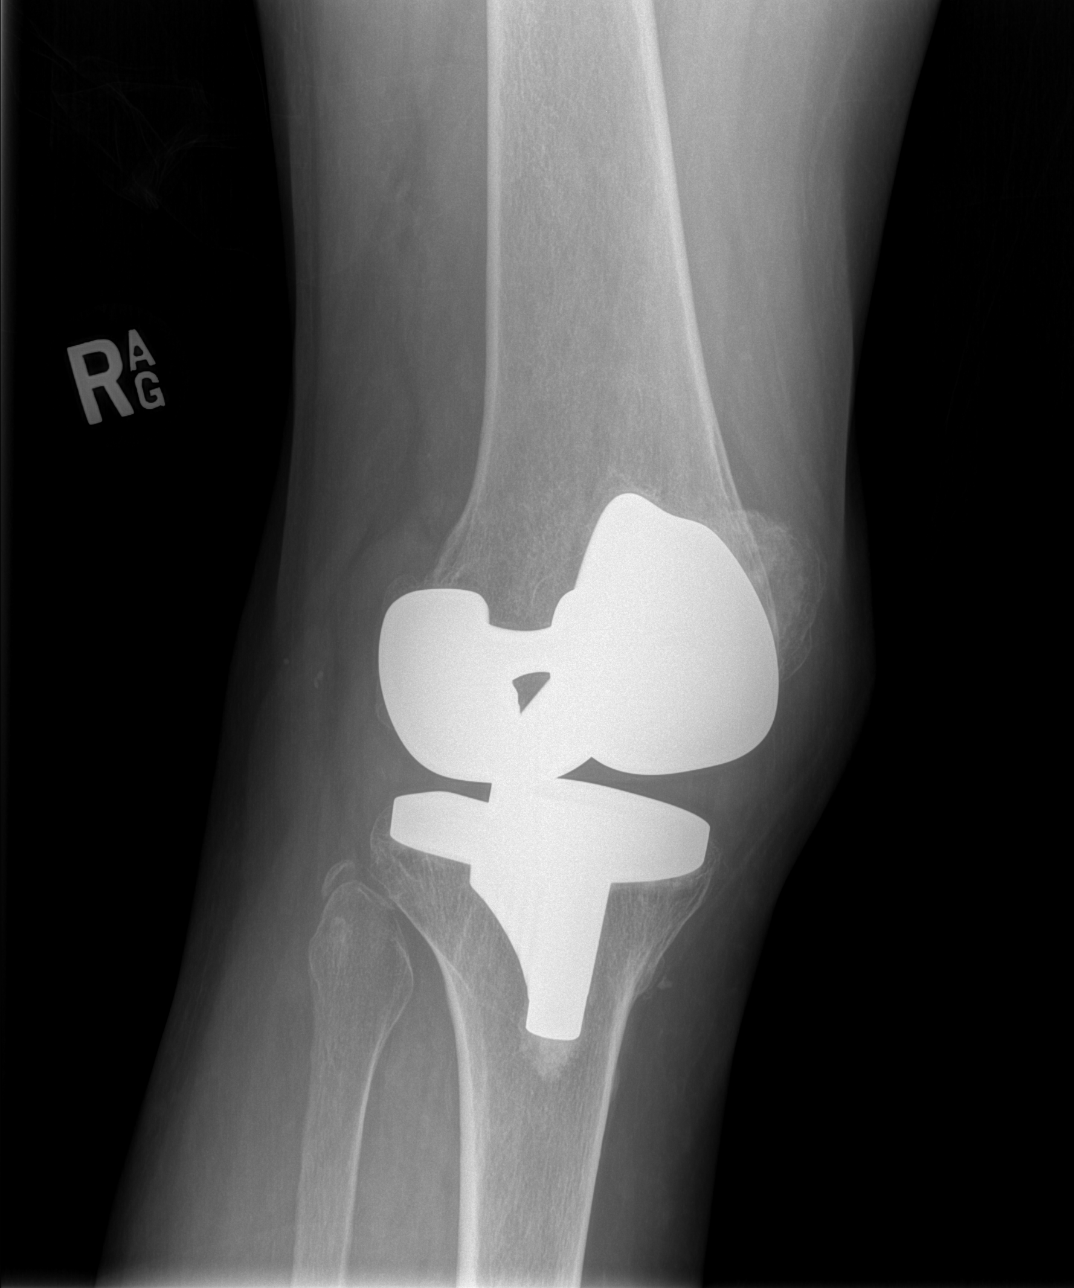

[t knee lat right]
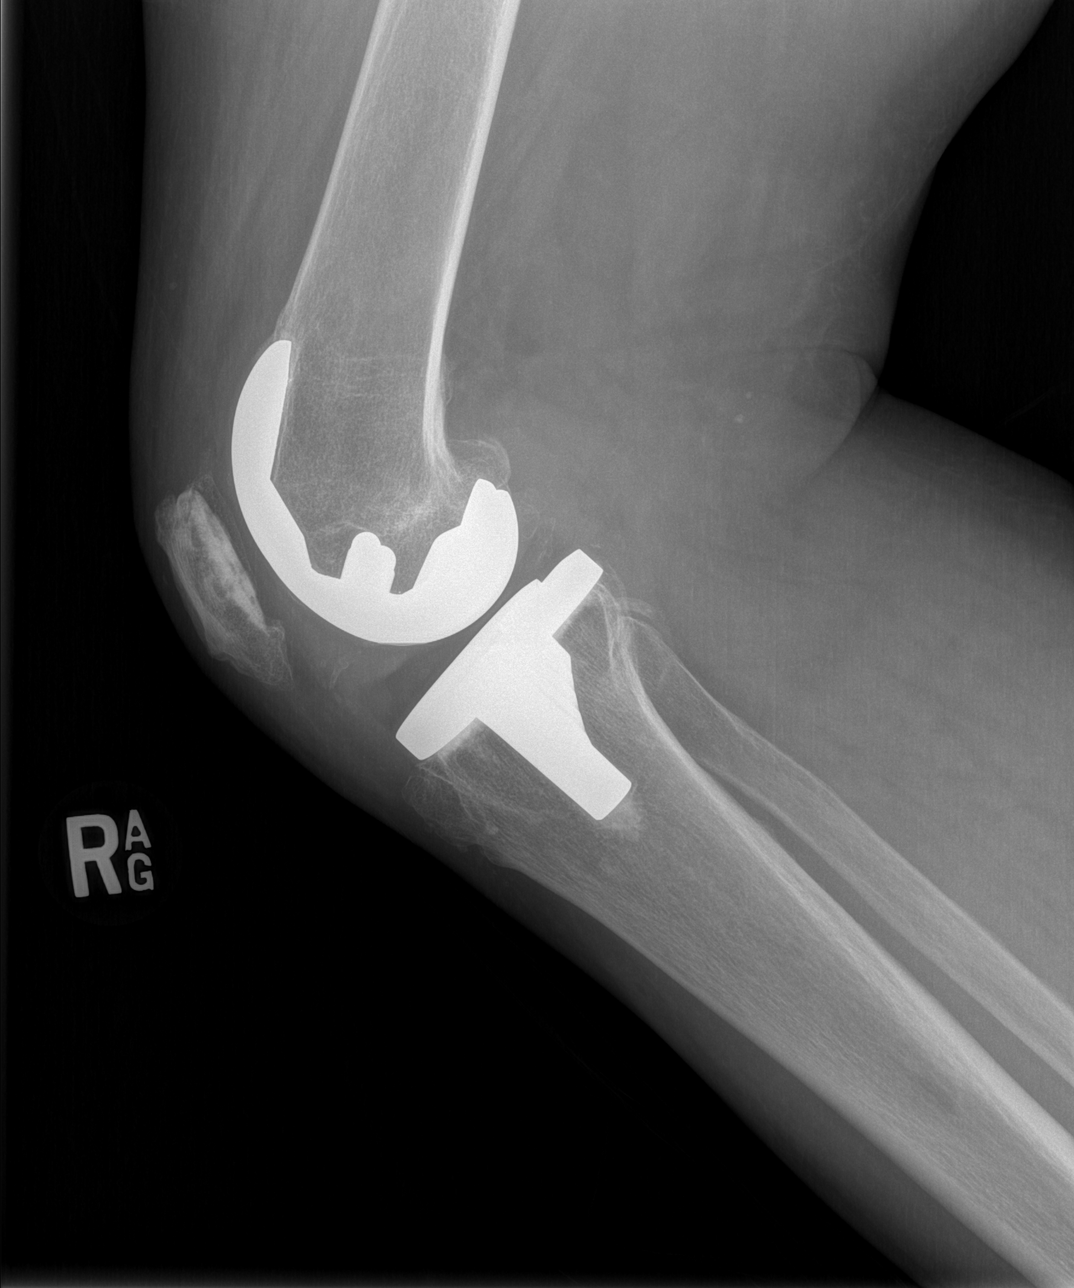

[4 of 4 positions shown; findings below may reference images not displayed]

FINDINGS: Total knee arthroplasty anatomically aligned. No breakage or
loosening of the hardware. No evidence of fracture or dislocation.
IMPRESSION: Total knee arthroplasty anatomically aligned. No acute bony
pathology.

## 2016-03-23 ENCOUNTER — Ambulatory Visit: Payer: Commercial Managed Care - HMO | Admitting: Physical Therapy

## 2016-03-23 DIAGNOSIS — R2681 Unsteadiness on feet: Secondary | ICD-10-CM | POA: Diagnosis not present

## 2016-03-23 DIAGNOSIS — M6281 Muscle weakness (generalized): Secondary | ICD-10-CM | POA: Diagnosis not present

## 2016-03-23 DIAGNOSIS — M5441 Lumbago with sciatica, right side: Secondary | ICD-10-CM | POA: Diagnosis not present

## 2016-03-23 DIAGNOSIS — M542 Cervicalgia: Secondary | ICD-10-CM | POA: Diagnosis not present

## 2016-03-23 DIAGNOSIS — G8929 Other chronic pain: Secondary | ICD-10-CM | POA: Diagnosis not present

## 2016-03-23 DIAGNOSIS — R2689 Other abnormalities of gait and mobility: Secondary | ICD-10-CM | POA: Diagnosis not present

## 2016-03-23 NOTE — Therapy (Signed)
[place], [place], [address] Phone: [phone]   Fax:  [phone]  Physical Therapy Treatment  Patient Details  Name: [name] MRN: [id number] Date of Birth: 1951/01/08 Referring Provider: Serafina Mitchell MD  Encounter Date: 03/23/2016      PT End of Session - 03/23/16 1806    Visit Number 13   Number of Visits 17   Date for PT Re-Evaluation 04/07/16   Authorization Type Medicare: Kx mod by 15th visit:  progress note by 23th visit   PT Start Time 1500   PT Stop Time 1553   PT Time Calculation (min) 53 min   Activity Tolerance Patient tolerated treatment well   Behavior During Therapy [facility] for tasks assessed/performed      Past Medical History:  Diagnosis Date  . Anginal pain ([place]) 03/19/2012   saw Dr. Cathie Olden .. she thinks its more related to stomach issues  . Arthritis    "all over; mostly in my back" (03/20/2012)  . Breast cancer ([place])    "right" (03/20/2012)  . Cardiomyopathy   . Chest pain 06/2005   Hospitalized, dystolic dysfunction  . Chronic lower back pain    "have 2 herniated discs; going to have to have a fusion" (03/20/2012)  . Family history of anesthesia complication    "father has nausea and vomiting"  . GERD (gastroesophageal reflux disease)   . H/O hiatal hernia   . History of kidney stones   . History of right mastectomy   . Hypertension    "patient states never had HTN, takes Hyzaar for heart  . Hypothyroidism   . Irritable bowel syndrome   . Migraines    "it's been a long time since I've had one" (03/20/2012)  . Osteoarthritis   . Peripheral neuropathy ([place])   . PONV (postoperative nausea and vomiting)   . Sciatica   . Type II diabetes mellitus ([place])     Past Surgical History:  Procedure Laterality Date  . ABDOMINAL HYSTERECTOMY  1993  . adenosin cardiolite  07/2005   (+) wall motion abnormality  . AXILLARY LYMPH NODE DISSECTION  2003   squamous cell  cancer  . BACK SURGERY    . CARDIAC CATHETERIZATION  ? 2005 / 2007  . CT abdomen and pelvis  02/2010   Same  . ESOPHAGOGASTRODUODENOSCOPY  2005   GERD  . KNEE ARTHROSCOPY  1990's   "right" (03/20/2012)  . LUMBAR FUSION N/A 08/22/2014   Procedure: Right L2-3 and right L1-2 transforaminal lumbar interbody fusion with pedicle screws, rods, sleeves, cages, local bone graft, Vivigen, cancellous chips;  Surgeon: Jessy Oto, MD;  Location: [place];  Service: Orthopedics;  Laterality: N/A;  . LUMBAR LAMINECTOMY/DECOMPRESSION MICRODISCECTOMY N/A 02/04/2014   Procedure: RIGHT L2-3 MICRODISCECTOMY ;  Surgeon: Jessy Oto, MD;  Location: [place];  Service: Orthopedics;  Laterality: N/A;  . MASTECTOMY WITH AXILLARY LYMPH NODE DISSECTION  12/2003   "right" (03/20/2012)  . POSTERIOR CERVICAL FUSION/FORAMINOTOMY  2001  . SHOULDER ARTHROSCOPY W/ ROTATOR CUFF REPAIR  2003   Left  . THYROIDECTOMY  1977   Right  . TOTAL KNEE ARTHROPLASTY  2000   Right  . [place] of abdomen  07/2005   Fatty liver / kidney stones    There were no vitals filed for this visit.      Subjective Assessment - 03/23/16 1504    Subjective "I saw md and he stated my BP was high and it was the  reason why I was having increased pain and tightness" today pt reports she really wants to focus on her back.   Currently in Pain? Yes   Pain Score 8   with standing/ walking   Pain Location Back   Pain Orientation Lower   Pain Type Chronic pain   Pain Onset More than a month ago   Pain Frequency Intermittent   Aggravating Factors  prolonged standing/ walking   Pain Relieving Factors pain meds, lying down   Pain Score 2   Pain Location Neck   Pain Orientation Posterior   Pain Type Chronic pain   Pain Onset More than a month ago   Pain Frequency Intermittent   Aggravating Factors  lying down,    Pain Relieving Factors stretching, dry needling, pain meds                         [facility] Adult PT Treatment/Exercise -  03/23/16 0001      Lumbar Exercises: Stretches   Lower Trunk Rotation --  2 x 10   Pelvic Tilt 5 reps;10 seconds     Lumbar Exercises: Aerobic   Stationary Bike Nustep L5 X 5 minutes     Moist Heat Therapy   Number Minutes Moist Heat 10 Minutes   Moist Heat Location Lumbar Spine     Electrical Stimulation   Electrical Stimulation Location Lumbar   Electrical Stimulation Action IFC   Electrical Stimulation Parameters 10 min,      Manual Therapy   Soft tissue mobilization IASTM over bil lumbar parapsinals   Myofascial Release fascial rolling/ stretching on bil lumbar paraspinal           Trigger Point Dry Needling - 03/23/16 1522    Consent Given? Yes   Education Handout Provided Yes   Muscles Treated Lower Body Gluteus minimus   Longissimus Response Twitch response elicited;Palpable increased muscle length  bil paraspinals/ multifidus x L2-L5    Gluteus Minimus Response Twitch response elicited;Palpable increased muscle length  glute medius bil                PT Short Term Goals - 03/16/16 1302      PT SHORT TERM GOAL #1   Title pt will be I with inital HEP (03/13/2016)   Time 4   Period Weeks   Status Partially Met     PT SHORT TERM GOAL #2   Title she will be able to verbalize and demo proper posture and lifting/ carrying mechanics to prevent and reduce neck and low back pain (03/13/2016)   Time 4   Period Weeks   Status On-going     PT SHORT TERM GOAL #3   Title she will demonstrate reduce muscle spasm in bil upper traps and R quadratus lumborum to decrease pain to </= 6/10 in neck /back and improve cervical and trunk mobility (03/12/2016)   Period Weeks   Status Partially Met     PT SHORT TERM GOAL #4   Title she will improve cervical mobility by >/= 5 degrees in all planes with </= 5/10 pain to assist with functional progression (03/12/2016)   Time 4   Period Weeks     PT SHORT TERM GOAL #5   Title pt will improve lumbar flexion/ extension by >/=  5 degrees and R rotation by to >/= 15% to assist with function with </= 5/10 pain (03/12/2016)   Time 4   Period Weeks   Status  Partially Met           PT Long Term Goals - 03/16/16 1304      PT LONG TERM GOAL #1   Title pt will be I with all HEP given as of last visit (04/07/2016)   Time 8   Period Weeks   Status On-going     PT LONG TERM GOAL #2   Title pt will increase cervical flexion / rotation by >/= 15 degrees with </= 2/10 pain to assist with safety during driving and ADLS (54/62/7035)   Time 8   Period Weeks   Status On-going     PT LONG TERM GOAL #3   Title pt will increase bil LE strength to >/= 4/5 with </= 2/10 pain for prolong walking/ standing ADLS and promote safety (04/07/2016)   Time 8   Period Weeks   Status On-going     PT LONG TERM GOAL #4   Title pt will be able to walk/ stand for >/=30 min with </= 2/10 pain utilizing </= LRAD for safety to promote functional endurance required or ADLs (04/07/2016)   Time 8   Period Weeks   Status On-going     PT LONG TERM GOAL #5   Title pt will increase her FOTO score to </=55% limited to demonstrate improvement in function at discharge (04/07/2016)   Time 8   Period Weeks   Status Unable to assess     PT LONG TERM GOAL #6   Title She will increase Her BERG balance by >/= 50/56 for safety during walking/ standing activities with LRAD    Time 7   Period Weeks   Status Unable to assess               Plan - 04/15/2016 1802    Clinical Impression Statement pt reports decreased pain inthe head and neck which her MD says its related to elevated BP. focused on the low back per pt request. DN was performed on bil lumbar parapsinals and bil glute med. following soft tissue work utilized e-stim to calm down pain. post session she reported pain dropped to 3/10 in the low back.    PT Next Visit Plan assess response to DN of the lumbar spine supine chin tucks, gentle; core/hip strength; posture training, modalities  PRN   Consulted and Agree with Plan of Care Patient      Patient will benefit from skilled therapeutic intervention in order to improve the following deficits and impairments:  Abnormal gait, Decreased activity tolerance, Decreased balance, Decreased endurance, Pain, Hypomobility, Decreased strength, Increased fascial restricitons, Difficulty walking, Decreased range of motion, Improper body mechanics, Postural dysfunction, Decreased mobility  Visit Diagnosis: Chronic bilateral low back pain with right-sided sciatica  Muscle weakness (generalized)  Cervicalgia  Unsteadiness on feet  Other abnormalities of gait and mobility       G-Codes - 2016-04-15 1805    Functional Assessment Tool Used clinical judgement   Functional Limitation Mobility: Walking and moving around   Mobility: Walking and Moving Around Current Status [phone]) At least 40 percent but less than 60 percent impaired, limited or restricted   Mobility: Walking and Moving Around Goal Status [phone] At least 40 percent but less than 60 percent impaired, limited or restricted      Problem List Patient Active Problem List   Diagnosis Date Noted  . Chronic constipation 06/23/2015  . Obesity 06/12/2015  . Chronic diastolic heart failure ([place]) 11/17/2014  . HNP (herniated nucleus pulposus), lumbar  08/22/2014    Class: Chronic  . Herniated nucleus pulposus, L2-3 right 02/04/2014    Class: Acute  . Urinary retention 05/04/2012    Class: Temporary  . Spondylolisthesis of lumbar region 04/30/2012    Class: Chronic  . Lumbar stenosis with neurogenic claudication 04/30/2012    Class: Chronic  . Chest pain 03/20/2012  . Right shoulder pain 12/07/2010  . ABDOMINAL PAIN, LOWER 09/21/2009  . BACK PAIN 03/18/2009  . SYNCOPE-CAROTID SINUS 09/16/2008  . Essential hypertension 08/05/2008  . Hypothyroidism 08/23/2006  . Diabetes with neurologic complications ([place]) 48/30/1599  . Diabetic neuropathy ([place]) 08/23/2006  .  CARDIOMYOPATHY 08/23/2006  . GERD 08/23/2006  . FATTY LIVER DISEASE 08/23/2006  . OSTEOARTHRITIS 08/23/2006  . [place] DISEASE, CERVICAL 08/23/2006  . [place] DISEASE, LUMBAR 08/23/2006  . Headache(784.0) 08/23/2006  . BREAST CANCER, HX OF 08/23/2006  . HYPERPARATHYROIDISM, HX OF 08/23/2006  . RENAL CALCULUS, HX OF 08/23/2006   Starr Lake PT, DPT, LAT, ATC  03/23/16  6:07 PM      [facility] [address] [place], [place], [address] Phone: [phone]   Fax:  [phone]  Name: [name] MRN: [id number] Date of Birth: 02-Sep-1950

## 2016-03-28 ENCOUNTER — Ambulatory Visit: Payer: Commercial Managed Care - HMO | Admitting: Physical Therapy

## 2016-03-28 DIAGNOSIS — R2689 Other abnormalities of gait and mobility: Secondary | ICD-10-CM | POA: Diagnosis not present

## 2016-03-28 DIAGNOSIS — M6281 Muscle weakness (generalized): Secondary | ICD-10-CM

## 2016-03-28 DIAGNOSIS — M542 Cervicalgia: Secondary | ICD-10-CM | POA: Diagnosis not present

## 2016-03-28 DIAGNOSIS — M5441 Lumbago with sciatica, right side: Principal | ICD-10-CM

## 2016-03-28 DIAGNOSIS — G8929 Other chronic pain: Secondary | ICD-10-CM | POA: Diagnosis not present

## 2016-03-28 DIAGNOSIS — R2681 Unsteadiness on feet: Secondary | ICD-10-CM | POA: Diagnosis not present

## 2016-03-28 NOTE — Therapy (Signed)
[place], [place], [address] Phone: [phone]   Fax:  [phone]  Physical Therapy Treatment  Patient Details  Name: [name] MRN: [id number] Date of Birth: 1951-01-25 Referring Provider: Serafina Mitchell MD  Encounter Date: 03/28/2016      PT End of Session - 03/28/16 1635    Visit Number 14   Number of Visits 17   Date for PT Re-Evaluation 04/07/16   Authorization Type Medicare: Kx mod by 15th visit:  progress note by 23th visit   PT Start Time 1546   PT Stop Time 1635   PT Time Calculation (min) 49 min   Activity Tolerance Patient tolerated treatment well   Behavior During Therapy [facility] for tasks assessed/performed      Past Medical History:  Diagnosis Date  . Anginal pain ([place]) 03/19/2012   saw Dr. Cathie Olden .. she thinks its more related to stomach issues  . Arthritis    "all over; mostly in my back" (03/20/2012)  . Breast cancer ([place])    "right" (03/20/2012)  . Cardiomyopathy   . Chest pain 06/2005   Hospitalized, dystolic dysfunction  . Chronic lower back pain    "have 2 herniated discs; going to have to have a fusion" (03/20/2012)  . Family history of anesthesia complication    "father has nausea and vomiting"  . GERD (gastroesophageal reflux disease)   . H/O hiatal hernia   . History of kidney stones   . History of right mastectomy   . Hypertension    "patient states never had HTN, takes Hyzaar for heart  . Hypothyroidism   . Irritable bowel syndrome   . Migraines    "it's been a long time since I've had one" (03/20/2012)  . Osteoarthritis   . Peripheral neuropathy ([place])   . PONV (postoperative nausea and vomiting)   . Sciatica   . Type II diabetes mellitus ([place])     Past Surgical History:  Procedure Laterality Date  . ABDOMINAL HYSTERECTOMY  1993  . adenosin cardiolite  07/2005   (+) wall motion abnormality  . AXILLARY LYMPH NODE DISSECTION  2003   squamous cell  cancer  . BACK SURGERY    . CARDIAC CATHETERIZATION  ? 2005 / 2007  . CT abdomen and pelvis  02/2010   Same  . ESOPHAGOGASTRODUODENOSCOPY  2005   GERD  . KNEE ARTHROSCOPY  1990's   "right" (03/20/2012)  . LUMBAR FUSION N/A 08/22/2014   Procedure: Right L2-3 and right L1-2 transforaminal lumbar interbody fusion with pedicle screws, rods, sleeves, cages, local bone graft, Vivigen, cancellous chips;  Surgeon: Jessy Oto, MD;  Location: [place];  Service: Orthopedics;  Laterality: N/A;  . LUMBAR LAMINECTOMY/DECOMPRESSION MICRODISCECTOMY N/A 02/04/2014   Procedure: RIGHT L2-3 MICRODISCECTOMY ;  Surgeon: Jessy Oto, MD;  Location: [place];  Service: Orthopedics;  Laterality: N/A;  . MASTECTOMY WITH AXILLARY LYMPH NODE DISSECTION  12/2003   "right" (03/20/2012)  . POSTERIOR CERVICAL FUSION/FORAMINOTOMY  2001  . SHOULDER ARTHROSCOPY W/ ROTATOR CUFF REPAIR  2003   Left  . THYROIDECTOMY  1977   Right  . TOTAL KNEE ARTHROPLASTY  2000   Right  . [place] of abdomen  07/2005   Fatty liver / kidney stones    There were no vitals filed for this visit.      Subjective Assessment - 03/28/16 1551    Subjective (P)  "I am feeling the most pain in my low back that occurs  as soon as I stand up. last session helped but it didn't last"    Currently in Pain? (P)  Yes   Pain Score (P)  7   sitting 1/10,    Pain Location (P)  Back   Pain Orientation (P)  Lower;Right   Pain Type (P)  Chronic pain   Pain Onset (P)  More than a month ago   Pain Frequency (P)  Intermittent   Aggravating Factors  (P)  standing, walking    Pain Score (P)  1   Pain Orientation (P)  Posterior   Pain Type (P)  Chronic pain   Pain Onset (P)  More than a month ago   Pain Frequency (P)  Intermittent                         [facility] Adult PT Treatment/Exercise - 03/28/16 0001      Lumbar Exercises: Seated   Sit to Stand 10 reps  with education on proper form using nose over toes     Knee/Hip Exercises:  Stretches   Hip Flexor Stretch 3 reps;30 seconds  PNF technque with 10 sec contraction     Knee/Hip Exercises: Seated   Other Seated Knee/Hip Exercises hamstring curls 2 x 15  with yellow theraband     Manual Therapy   Manual therapy comments manual trigger point release of the R glute med x 4   Myofascial Release DTM rolling tennis ball over the glute med                PT Education - 03/28/16 1627    Education provided Yes   Education Details updated HEP for hip flexor stretching and hamstring curls utilizing proper form and treatment rationale.  anatmoy and biomechanics of hip and effects of muscles that could cause a rotation   Person(s) Educated Patient   Methods Explanation;Verbal cues;Handout   Comprehension Verbalized understanding;Verbal cues required          PT Short Term Goals - 03/16/16 1302      PT SHORT TERM GOAL #1   Title pt will be I with inital HEP (03/13/2016)   Time 4   Period Weeks   Status Partially Met     PT SHORT TERM GOAL #2   Title she will be able to verbalize and demo proper posture and lifting/ carrying mechanics to prevent and reduce neck and low back pain (03/13/2016)   Time 4   Period Weeks   Status On-going     PT SHORT TERM GOAL #3   Title she will demonstrate reduce muscle spasm in bil upper traps and R quadratus lumborum to decrease pain to </= 6/10 in neck /back and improve cervical and trunk mobility (03/12/2016)   Period Weeks   Status Partially Met     PT SHORT TERM GOAL #4   Title she will improve cervical mobility by >/= 5 degrees in all planes with </= 5/10 pain to assist with functional progression (03/12/2016)   Time 4   Period Weeks     PT SHORT TERM GOAL #5   Title pt will improve lumbar flexion/ extension by >/= 5 degrees and R rotation by to >/= 15% to assist with function with </= 5/10 pain (03/12/2016)   Time 4   Period Weeks   Status Partially Met           PT Long Term Goals - 03/16/16 1304  PT  LONG TERM GOAL #1   Title pt will be I with all HEP given as of last visit (04/07/2016)   Time 8   Period Weeks   Status On-going     PT LONG TERM GOAL #2   Title pt will increase cervical flexion / rotation by >/= 15 degrees with </= 2/10 pain to assist with safety during driving and ADLS (00/93/8182)   Time 8   Period Weeks   Status On-going     PT LONG TERM GOAL #3   Title pt will increase bil LE strength to >/= 4/5 with </= 2/10 pain for prolong walking/ standing ADLS and promote safety (04/07/2016)   Time 8   Period Weeks   Status On-going     PT LONG TERM GOAL #4   Title pt will be able to walk/ stand for >/=30 min with </= 2/10 pain utilizing </= LRAD for safety to promote functional endurance required or ADLs (04/07/2016)   Time 8   Period Weeks   Status On-going     PT LONG TERM GOAL #5   Title pt will increase her FOTO score to </=55% limited to demonstrate improvement in function at discharge (04/07/2016)   Time 8   Period Weeks   Status Unable to assess     PT LONG TERM GOAL #6   Title She will increase Her BERG balance by >/= 50/56 for safety during walking/ standing activities with LRAD    Time 7   Period Weeks   Status Unable to assess               Plan - 03/28/16 1636    Clinical Impression Statement pt continues to report low back pain that starts after any standing or walking. Further assessment revaled R PSIS pain and significant tightness of R hip flexors with reproduciton of symptoms with stretching in the low back. perofmed MET of R hip flexors and activation hamstrings which she reproted relief of pain with standing. pt declined MHP today.  end of session she reported pain dropped to 4/10 with standing/ walking.   PT Next Visit Plan hip flexor stretching and hamstring strengthening on the R, MET techniques for R anterior innominate rotation, gentle; core/hip strength; posture training, modalities PRN   PT Home Exercise Plan clam shells, upper  trap stretch, chin tucks (in supine), QL stretching, pelvic tilts, 11/20:  supine abdominal isometrics, hip stretches into flexion, bridges, Dry Needling, hip flexor stretching, and hamstring curls.       Patient will benefit from skilled therapeutic intervention in order to improve the following deficits and impairments:  Abnormal gait, Decreased activity tolerance, Decreased balance, Decreased endurance, Pain, Hypomobility, Decreased strength, Increased fascial restricitons, Difficulty walking, Decreased range of motion, Improper body mechanics, Postural dysfunction, Decreased mobility  Visit Diagnosis: Chronic bilateral low back pain with right-sided sciatica  Muscle weakness (generalized)  Cervicalgia  Unsteadiness on feet  Other abnormalities of gait and mobility     Problem List Patient Active Problem List   Diagnosis Date Noted  . Chronic constipation 06/23/2015  . Obesity 06/12/2015  . Chronic diastolic heart failure ([place]) 11/17/2014  . HNP (herniated nucleus pulposus), lumbar 08/22/2014    Class: Chronic  . Herniated nucleus pulposus, L2-3 right 02/04/2014    Class: Acute  . Urinary retention 05/04/2012    Class: Temporary  . Spondylolisthesis of lumbar region 04/30/2012    Class: Chronic  . Lumbar stenosis with neurogenic claudication 04/30/2012    Class:  Chronic  . Chest pain 03/20/2012  . Right shoulder pain 12/07/2010  . ABDOMINAL PAIN, LOWER 09/21/2009  . BACK PAIN 03/18/2009  . SYNCOPE-CAROTID SINUS 09/16/2008  . Essential hypertension 08/05/2008  . Hypothyroidism 08/23/2006  . Diabetes with neurologic complications ([place]) 02/11/1116  . Diabetic neuropathy ([place]) 08/23/2006  . CARDIOMYOPATHY 08/23/2006  . GERD 08/23/2006  . FATTY LIVER DISEASE 08/23/2006  . OSTEOARTHRITIS 08/23/2006  . [place] DISEASE, CERVICAL 08/23/2006  . [place] DISEASE, LUMBAR 08/23/2006  . Headache(784.0) 08/23/2006  . BREAST CANCER, HX OF 08/23/2006  . HYPERPARATHYROIDISM, HX OF  08/23/2006  . RENAL CALCULUS, HX OF 08/23/2006   Starr Lake PT, DPT, LAT, ATC  03/28/16  5:10 PM      [facility] [address] [place], [place], [address] Phone: [phone]   Fax:  [phone]  Name: [name] MRN: [id number] Date of Birth: 1950-08-09

## 2016-03-30 ENCOUNTER — Ambulatory Visit: Payer: Commercial Managed Care - HMO | Admitting: Physical Therapy

## 2016-03-30 DIAGNOSIS — M5441 Lumbago with sciatica, right side: Secondary | ICD-10-CM | POA: Diagnosis not present

## 2016-03-30 DIAGNOSIS — R2689 Other abnormalities of gait and mobility: Secondary | ICD-10-CM

## 2016-03-30 DIAGNOSIS — M6281 Muscle weakness (generalized): Secondary | ICD-10-CM

## 2016-03-30 DIAGNOSIS — R2681 Unsteadiness on feet: Secondary | ICD-10-CM

## 2016-03-30 DIAGNOSIS — G8929 Other chronic pain: Secondary | ICD-10-CM

## 2016-03-30 DIAGNOSIS — M542 Cervicalgia: Secondary | ICD-10-CM

## 2016-03-30 NOTE — Therapy (Signed)
[place], [place], [address] Phone: [phone]   Fax:  [phone]  Physical Therapy Treatment  Patient Details  Name: [name] MRN: [id number] Date of Birth: [birth year] Referring Provider: Serafina Mitchell MD  Encounter Date: 03/30/2016      PT End of Session - 03/30/16 1249    Visit Number 15   Number of Visits 17   Date for PT Re-Evaluation 04/07/16   Authorization Type Medicare: KX MOD;  progress note by 23th visit   PT Start Time 1146   PT Stop Time 1234   PT Time Calculation (min) 48 min   Activity Tolerance Patient tolerated treatment well   Behavior During Therapy [facility] for tasks assessed/performed      Past Medical History:  Diagnosis Date  . Anginal pain ([place]) 03/19/2012   saw Dr. Cathie Olden .. she thinks its more related to stomach issues  . Arthritis    "all over; mostly in my back" (03/20/2012)  . Breast cancer ([place])    "right" (03/20/2012)  . Cardiomyopathy   . Chest pain 06/2005   Hospitalized, dystolic dysfunction  . Chronic lower back pain    "have 2 herniated discs; going to have to have a fusion" (03/20/2012)  . Family history of anesthesia complication    "father has nausea and vomiting"  . GERD (gastroesophageal reflux disease)   . H/O hiatal hernia   . History of kidney stones   . History of right mastectomy   . Hypertension    "patient states never had HTN, takes Hyzaar for heart  . Hypothyroidism   . Irritable bowel syndrome   . Migraines    "it's been a long time since I've had one" (03/20/2012)  . Osteoarthritis   . Peripheral neuropathy ([place])   . PONV (postoperative nausea and vomiting)   . Sciatica   . Type II diabetes mellitus ([place])     Past Surgical History:  Procedure Laterality Date  . ABDOMINAL HYSTERECTOMY  1993  . adenosin cardiolite  07/2005   (+) wall motion abnormality  . AXILLARY LYMPH NODE DISSECTION  2003   squamous cell cancer  . BACK  SURGERY    . CARDIAC CATHETERIZATION  ? 2005 / 2007  . CT abdomen and pelvis  02/2010   Same  . ESOPHAGOGASTRODUODENOSCOPY  2005   GERD  . KNEE ARTHROSCOPY  1990's   "right" (03/20/2012)  . LUMBAR FUSION N/A 08/22/2014   Procedure: Right L2-3 and right L1-2 transforaminal lumbar interbody fusion with pedicle screws, rods, sleeves, cages, local bone graft, Vivigen, cancellous chips;  Surgeon: Jessy Oto, MD;  Location: [place];  Service: Orthopedics;  Laterality: N/A;  . LUMBAR LAMINECTOMY/DECOMPRESSION MICRODISCECTOMY N/A 02/04/2014   Procedure: RIGHT L2-3 MICRODISCECTOMY ;  Surgeon: Jessy Oto, MD;  Location: [place];  Service: Orthopedics;  Laterality: N/A;  . MASTECTOMY WITH AXILLARY LYMPH NODE DISSECTION  12/2003   "right" (03/20/2012)  . POSTERIOR CERVICAL FUSION/FORAMINOTOMY  2001  . SHOULDER ARTHROSCOPY W/ ROTATOR CUFF REPAIR  2003   Left  . THYROIDECTOMY  1977   Right  . TOTAL KNEE ARTHROPLASTY  2000   Right  . [place] of abdomen  07/2005   Fatty liver / kidney stones    There were no vitals filed for this visit.      Subjective Assessment - 03/30/16 1155    Subjective "I was able to do more standing and cooking without having increased pain in the low back"  Currently in Pain? Yes   Pain Score 3    Pain Location Back   Pain Orientation Lower   Pain Type Chronic pain   Pain Onset More than a month ago   Pain Frequency Intermittent   Aggravating Factors  prolonged standing/ walking   Pain Relieving Factors pain meds, lying down.                          [place] Adult PT Treatment/Exercise - 03/30/16 0001      Lumbar Exercises: Stretches   Lower Trunk Rotation --  2 x 10   Pelvic Tilt --  2 x 10 with verbal/ tactile cues on form     Lumbar Exercises: Aerobic   Stationary Bike Nustep L5 X 5 minutes  LE     Lumbar Exercises: Seated   Sit to Stand 10 reps     Lumbar Exercises: Supine   Bridge 10 reps  2 sets with heels   Bridge Limitations  utilized phyisoball to facilitate hamstring involvement     Knee/Hip Exercises: Stretches   Hip Flexor Stretch 4 reps;30 seconds  contract/ relax with 10 sec contraction     Knee/Hip Exercises: Seated   Other Seated Knee/Hip Exercises hamstring curls 3 x 15  green theraband     Manual Therapy   Manual therapy comments manual trigger point release of the glute med x 3 and R lumbar parapsinals x 3   Soft tissue mobilization IASTM over bil lumbar parapsinals                  PT Short Term Goals - 03/16/16 1302      PT SHORT TERM GOAL #1   Title pt will be I with inital HEP (03/13/2016)   Time 4   Period Weeks   Status Partially Met     PT SHORT TERM GOAL #2   Title she will be able to verbalize and demo proper posture and lifting/ carrying mechanics to prevent and reduce neck and low back pain (03/13/2016)   Time 4   Period Weeks   Status On-going     PT SHORT TERM GOAL #3   Title she will demonstrate reduce muscle spasm in bil upper traps and R quadratus lumborum to decrease pain to </= 6/10 in neck /back and improve cervical and trunk mobility (03/12/2016)   Period Weeks   Status Partially Met     PT SHORT TERM GOAL #4   Title she will improve cervical mobility by >/= 5 degrees in all planes with </= 5/10 pain to assist with functional progression (03/12/2016)   Time 4   Period Weeks     PT SHORT TERM GOAL #5   Title pt will improve lumbar flexion/ extension by >/= 5 degrees and R rotation by to >/= 15% to assist with function with </= 5/10 pain (03/12/2016)   Time 4   Period Weeks   Status Partially Met           PT Long Term Goals - 03/16/16 1304      PT LONG TERM GOAL #1   Title pt will be I with all HEP given as of last visit (04/07/2016)   Time 8   Period Weeks   Status On-going     PT LONG TERM GOAL #2   Title pt will increase cervical flexion / rotation by >/= 15 degrees with </= 2/10 pain to assist with safety during driving  and ADLS  (46/96/2952)   Time 8   Period Weeks   Status On-going     PT LONG TERM GOAL #3   Title pt will increase bil LE strength to >/= 4/5 with </= 2/10 pain for prolong walking/ standing ADLS and promote safety (04/07/2016)   Time 8   Period Weeks   Status On-going     PT LONG TERM GOAL #4   Title pt will be able to walk/ stand for >/=30 min with </= 2/10 pain utilizing </= LRAD for safety to promote functional endurance required or ADLs (04/07/2016)   Time 8   Period Weeks   Status On-going     PT LONG TERM GOAL #5   Title pt will increase her FOTO score to </=55% limited to demonstrate improvement in function at discharge (04/07/2016)   Time 8   Period Weeks   Status Unable to assess     PT LONG TERM GOAL #6   Title She will increase Her BERG balance by >/= 50/56 for safety during walking/ standing activities with LRAD    Time 7   Period Weeks   Status Unable to assess               Plan - 03/30/16 1247    Clinical Impression Statement Mrs. Lobb reports significant improvement in pain with anterior rotation innominate treatment. continued METs for hamstring activation on the R. she completed all exercises with intermittent report of soreness. post session she declined modalities reporting pain dropped to 1/10.   PT Next Visit Plan hip flexor stretching and hamstring strengthening on the R, MET techniques for R anterior innominate rotation, gentle; core/hip strength; posture training, modalities PRN   Consulted and Agree with Plan of Care Patient      Patient will benefit from skilled therapeutic intervention in order to improve the following deficits and impairments:  Abnormal gait, Decreased activity tolerance, Decreased balance, Decreased endurance, Pain, Hypomobility, Decreased strength, Increased fascial restricitons, Difficulty walking, Decreased range of motion, Improper body mechanics, Postural dysfunction, Decreased mobility  Visit Diagnosis: Chronic bilateral  low back pain with right-sided sciatica  Muscle weakness (generalized)  Cervicalgia  Unsteadiness on feet  Other abnormalities of gait and mobility     Problem List Patient Active Problem List   Diagnosis Date Noted  . Chronic constipation 06/23/2015  . Obesity 06/12/2015  . Chronic diastolic heart failure ([place]) 11/17/2014  . HNP (herniated nucleus pulposus), lumbar 08/22/2014    Class: Chronic  . Herniated nucleus pulposus, L2-3 right 02/04/2014    Class: Acute  . Urinary retention 05/04/2012    Class: Temporary  . Spondylolisthesis of lumbar region 04/30/2012    Class: Chronic  . Lumbar stenosis with neurogenic claudication 04/30/2012    Class: Chronic  . Chest pain 03/20/2012  . Right shoulder pain 12/07/2010  . ABDOMINAL PAIN, LOWER 09/21/2009  . BACK PAIN 03/18/2009  . SYNCOPE-CAROTID SINUS 09/16/2008  . Essential hypertension 08/05/2008  . Hypothyroidism 08/23/2006  . Diabetes with neurologic complications ([place]) 84/13/2440  . Diabetic neuropathy ([place]) 08/23/2006  . CARDIOMYOPATHY 08/23/2006  . GERD 08/23/2006  . FATTY LIVER DISEASE 08/23/2006  . OSTEOARTHRITIS 08/23/2006  . [place] DISEASE, CERVICAL 08/23/2006  . [place] DISEASE, LUMBAR 08/23/2006  . Headache(784.0) 08/23/2006  . BREAST CANCER, HX OF 08/23/2006  . HYPERPARATHYROIDISM, HX OF 08/23/2006  . RENAL CALCULUS, HX OF 08/23/2006   Starr Lake PT, DPT, LAT, ATC  03/30/16  12:51 PM      [facility] [address] [place], [place], [address] Phone: [phone]   Fax:  [phone]  Name: DYMOND GUTT MRN: [id number] Date of Birth: April 05, 1951

## 2016-04-07 ENCOUNTER — Ambulatory Visit: Payer: Commercial Managed Care - HMO | Admitting: Physical Therapy

## 2016-04-07 DIAGNOSIS — G8929 Other chronic pain: Secondary | ICD-10-CM

## 2016-04-07 DIAGNOSIS — M542 Cervicalgia: Secondary | ICD-10-CM | POA: Diagnosis not present

## 2016-04-07 DIAGNOSIS — M6281 Muscle weakness (generalized): Secondary | ICD-10-CM | POA: Diagnosis not present

## 2016-04-07 DIAGNOSIS — M5441 Lumbago with sciatica, right side: Secondary | ICD-10-CM | POA: Diagnosis not present

## 2016-04-07 DIAGNOSIS — R2681 Unsteadiness on feet: Secondary | ICD-10-CM | POA: Diagnosis not present

## 2016-04-07 DIAGNOSIS — R2689 Other abnormalities of gait and mobility: Secondary | ICD-10-CM

## 2016-04-07 NOTE — Patient Instructions (Signed)

## 2016-04-07 NOTE — Therapy (Addendum)
[place], [place], [address] Phone: [phone]   Fax:  [phone]  Physical Therapy Treatment / Discharge Note  Patient Details  Name: [name] MRN: [id number] Date of Birth: 12/15/1950 Referring Provider: Serafina Mitchell MD  Encounter Date: 04/07/2016      PT End of Session - 04/07/16 1310    Visit Number 16   Number of Visits 17   Date for PT Re-Evaluation 04/07/16   Authorization Type Medicare: KX MOD;  progress note by 23th visit   PT Start Time 1156   PT Stop Time 1237   PT Time Calculation (min) 41 min      Past Medical History:  Diagnosis Date  . Anginal pain ([place]) 03/19/2012   saw Dr. Cathie Olden .. she thinks its more related to stomach issues  . Arthritis    "all over; mostly in my back" (03/20/2012)  . Breast cancer ([place])    "right" (03/20/2012)  . Cardiomyopathy   . Chest pain 06/2005   Hospitalized, dystolic dysfunction  . Chronic lower back pain    "have 2 herniated discs; going to have to have a fusion" (03/20/2012)  . Family history of anesthesia complication    "father has nausea and vomiting"  . GERD (gastroesophageal reflux disease)   . H/O hiatal hernia   . History of kidney stones   . History of right mastectomy   . Hypertension    "patient states never had HTN, takes Hyzaar for heart  . Hypothyroidism   . Irritable bowel syndrome   . Migraines    "it's been a long time since I've had one" (03/20/2012)  . Osteoarthritis   . Peripheral neuropathy ([place])   . PONV (postoperative nausea and vomiting)   . Sciatica   . Type II diabetes mellitus ([place])     Past Surgical History:  Procedure Laterality Date  . ABDOMINAL HYSTERECTOMY  1993  . adenosin cardiolite  07/2005   (+) wall motion abnormality  . AXILLARY LYMPH NODE DISSECTION  2003   squamous cell cancer  . BACK SURGERY    . CARDIAC CATHETERIZATION  ? 2005 / 2007  . CT abdomen and pelvis  02/2010   Same  .  ESOPHAGOGASTRODUODENOSCOPY  2005   GERD  . KNEE ARTHROSCOPY  1990's   "right" (03/20/2012)  . LUMBAR FUSION N/A 08/22/2014   Procedure: Right L2-3 and right L1-2 transforaminal lumbar interbody fusion with pedicle screws, rods, sleeves, cages, local bone graft, Vivigen, cancellous chips;  Surgeon: Jessy Oto, MD;  Location: [facility];  Service: Orthopedics;  Laterality: N/A;  . LUMBAR LAMINECTOMY/DECOMPRESSION MICRODISCECTOMY N/A 02/04/2014   Procedure: RIGHT L2-3 MICRODISCECTOMY ;  Surgeon: Jessy Oto, MD;  Location: [facility];  Service: Orthopedics;  Laterality: N/A;  . MASTECTOMY WITH AXILLARY LYMPH NODE DISSECTION  12/2003   "right" (03/20/2012)  . POSTERIOR CERVICAL FUSION/FORAMINOTOMY  2001  . SHOULDER ARTHROSCOPY W/ ROTATOR CUFF REPAIR  2003   Left  . THYROIDECTOMY  1977   Right  . TOTAL KNEE ARTHROPLASTY  2000   Right  . [place] of abdomen  07/2005   Fatty liver / kidney stones    There were no vitals filed for this visit.      Subjective Assessment - 04/07/16 1200    Currently in Pain? Yes   Pain Score 3    Pain Location Back   Pain Orientation Lower   Pain Descriptors / Indicators Dull;Aching   Aggravating Factors  prolonged standing and walking    Pain Relieving Factors pain meds, exercise    Pain Score 3   Pain Location Neck   Pain Orientation Posterior   Pain Descriptors / Indicators Sore;Throbbing   Aggravating Factors  looking down    Pain Relieving Factors self trigger point release             [facility] PT Assessment - 04/07/16 0001      Observation/Other Assessments   Focus on Therapeutic Outcomes (FOTO)  50% limited     AROM   Cervical Flexion 32   Cervical Extension 35   Cervical - Right Side Bend 30   Cervical - Left Side Bend 25   Cervical - Right Rotation 40   Cervical - Left Rotation 40     Strength   Right Hip Flexion 4-/5   Right Hip ABduction 3/5   Left Hip Flexion 4-/5                     [facility] Adult PT Treatment/Exercise -  04/07/16 0001      Berg Balance Test   Sit to Stand Able to stand without using hands and stabilize independently   Standing Unsupported Able to stand safely 2 minutes   Sitting with Back Unsupported but Feet Supported on Floor or Stool Able to sit safely and securely 2 minutes   Stand to Sit Sits safely with minimal use of hands   Transfers Able to transfer safely, minor use of hands   Standing Unsupported with Eyes Closed Able to stand 10 seconds safely   Standing Ubsupported with Feet Together Able to place feet together independently and stand 1 minute safely   From Standing, Reach Forward with Outstretched Arm Can reach forward >12 cm safely (5")   From Standing Position, Pick up Object from Floor Able to pick up shoe, needs supervision   From Standing Position, Turn to Look Behind Over each Shoulder Looks behind one side only/other side shows less weight shift   Turn 360 Degrees Able to turn 360 degrees safely one side only in 4 seconds or less   Standing Unsupported, Alternately Place Feet on Step/Stool Able to stand independently and complete 8 steps >20 seconds   Standing Unsupported, One Foot in Front Able to plae foot ahead of the other independently and hold 30 seconds   Standing on One Leg Tries to lift leg/unable to hold 3 seconds but remains standing independently   Total Score 47     Neck Exercises: Supine   Neck Retraction 10 reps   Neck Retraction Limitations 2 pillows     Lumbar Exercises: Stretches   Lower Trunk Rotation --  2 x 10   Pelvic Tilt --  2 x 10 with verbal/ tactile cues on form     Lumbar Exercises: Supine   Clam 20 reps   Clam Limitations green band      Knee/Hip Exercises: Stretches   Hip Flexor Stretch 4 reps;30 seconds  contract/ relax with 10 sec contraction     Knee/Hip Exercises: Seated   Other Seated Knee/Hip Exercises hamstring curls 3 x 15  green theraband                PT Education - 04/07/16 1316    Education provided  Yes   Education Details Posture and Water engineer) Educated Patient   Methods Explanation;Handout   Comprehension Verbalized understanding  PT Short Term Goals - 04/07/16 1206      PT SHORT TERM GOAL #1   Title pt will be I with inital HEP (03/13/2016)   Time 4   Period Weeks   Status Achieved     PT SHORT TERM GOAL #2   Title she will be able to verbalize and demo proper posture and lifting/ carrying mechanics to prevent and reduce neck and low back pain (03/13/2016)   Baseline posture /body mechanics handout given, reviewed hip hinge    Time 4   Period Weeks   Status Partially Met     PT SHORT TERM GOAL #3   Title she will demonstrate reduce muscle spasm in bil upper traps and R quadratus lumborum to decrease pain to </= 6/10 in neck /back and improve cervical and trunk mobility (03/12/2016)   Baseline up to 6-7/10 yesterday in neck, happening less frequent in neck, no more back spasm.    Time 4   Period Weeks   Status Partially Met     PT SHORT TERM GOAL #4   Title she will improve cervical mobility by >/= 5 degrees in all planes with </= 5/10 pain to assist with functional progression (03/12/2016)   Baseline see objective measures    Time 4   Period Weeks   Status Partially Met     PT SHORT TERM GOAL #5   Title pt will improve lumbar flexion/ extension by >/= 5 degrees and R rotation by to >/= 15% to assist with function with </= 5/10 pain (03/12/2016)   Time 4   Period Weeks   Status Unable to assess     PT SHORT TERM GOAL #6   Title assess Berg balance and make goals accordingly   Time 2   Period Weeks   Status Achieved           PT Long Term Goals - 04/07/16 1305      PT LONG TERM GOAL #1   Title pt will be I with all HEP given as of last visit (04/07/2016)   Time 8   Period Weeks   Status Achieved     PT LONG TERM GOAL #2   Title pt will increase cervical flexion / rotation by >/= 15 degrees with </= 2/10 pain to assist  with safety during driving and ADLS (22/29/7989)   Time 8   Period Weeks   Status Partially Met     PT LONG TERM GOAL #3   Title pt will increase bil LE strength to >/= 4/5 with </= 2/10 pain for prolong walking/ standing ADLS and promote safety (04/07/2016)   Baseline grossly 3+/5 to 4-/5    Time 8   Period Weeks   Status Not Met     PT LONG TERM GOAL #4   Title pt will be able to walk/ stand for >/=30 min with </= 2/10 pain utilizing </= LRAD for safety to promote functional endurance required or ADLs (04/07/2016)   Baseline 10 minutes stand 15 minutes walk   Time 8   Period Weeks   Status Not Met     PT LONG TERM GOAL #5   Title pt will increase her FOTO score to </=55% limited to demonstrate improvement in function at discharge (04/07/2016)   Baseline 50% limited   Time 8   Period Weeks   Status Achieved     PT LONG TERM GOAL #6   Title She will increase Her BERG balance by >/= 50/56  for safety during walking/ standing activities with LRAD    Baseline 47/56   Time 7   Period Weeks   Status Not Met               G-Codes - 04-27-2016 1200    Functional Assessment Tool Used clinical judgement   Functional Limitation Mobility: Walking and moving around   Mobility: Walking and Moving Around Goal Status [phone]) At least 40 percent but less than 60 percent impaired, limited or restricted   Mobility: Walking and Moving Around Discharge Status [phone]) At least 20 percent but less than 40 percent impaired, limited or restricted           Plan - 04/07/16 1203    Clinical Impression Statement Able to stand most of the day on Christmas for cooking and serving. Had four rest breaks. Able to stand 10 minuts at a time (improved from 2-3 minutes max). her hip strength has slightly improved. Encouraged her to continue HEP. Self trigger pint release has helped a great deal in managing her neck pain. Her AROM has improved in all planes except for rotation which worsened and then  returned to baseline at 40 degrees bilateral. Her BERG has improved from 42 to 47/56. She requests for this to be her last treatment. See goals met/partially met.    PT Next Visit Plan discharge today    PT Home Exercise Plan clam shells, upper trap stretch, chin tucks (in supine), QL stretching, pelvic tilts, 11/20:  supine abdominal isometrics, hip stretches into flexion, bridges, Dry Needling, hip flexor stretching, and hamstring curls.       Patient will benefit from skilled therapeutic intervention in order to improve the following deficits and impairments:  Abnormal gait, Decreased activity tolerance, Decreased balance, Decreased endurance, Pain, Hypomobility, Decreased strength, Increased fascial restricitons, Difficulty walking, Decreased range of motion, Improper body mechanics, Postural dysfunction, Decreased mobility  Visit Diagnosis: Chronic bilateral low back pain with right-sided sciatica  Muscle weakness (generalized)  Cervicalgia  Other abnormalities of gait and mobility     Problem List Patient Active Problem List   Diagnosis Date Noted  . Chronic constipation 06/23/2015  . Obesity 06/12/2015  . Chronic diastolic heart failure ([place]) 11/17/2014  . HNP (herniated nucleus pulposus), lumbar 08/22/2014    Class: Chronic  . Herniated nucleus pulposus, L2-3 right 02/04/2014    Class: Acute  . Urinary retention 05/04/2012    Class: Temporary  . Spondylolisthesis of lumbar region 04/30/2012    Class: Chronic  . Lumbar stenosis with neurogenic claudication 04/30/2012    Class: Chronic  . Chest pain 03/20/2012  . Right shoulder pain 12/07/2010  . ABDOMINAL PAIN, LOWER 09/21/2009  . BACK PAIN 03/18/2009  . SYNCOPE-CAROTID SINUS 09/16/2008  . Essential hypertension 08/05/2008  . Hypothyroidism 08/23/2006  . Diabetes with neurologic complications ([place]) 95/12/3265  . Diabetic neuropathy ([place]) 08/23/2006  . CARDIOMYOPATHY 08/23/2006  . GERD 08/23/2006  . FATTY LIVER  DISEASE 08/23/2006  . OSTEOARTHRITIS 08/23/2006  . [place] DISEASE, CERVICAL 08/23/2006  . [place] DISEASE, LUMBAR 08/23/2006  . Headache(784.0) 08/23/2006  . BREAST CANCER, HX OF 08/23/2006  . HYPERPARATHYROIDISM, HX OF 08/23/2006  . RENAL CALCULUS, HX OF 08/23/2006    Dorene Ar, PTA 04/07/2016, 1:17 PM  [place], [place], [address] Phone: [phone]   Fax:  [phone]  Name: NAVEH RICKLES MRN: [id number] Date of Birth: [birth year]   PHYSICAL THERAPY DISCHARGE SUMMARY  Visits from Start of  Care: 16  Current functional level related to goals / functional outcomes: See goals   Remaining deficits: Intermittent tightness and pain R low back, and in bil upper traps. Limited standing time although it has improved. Fluctuation in Limited cervical mobility depending on muscle tightness and pain.    Education / Equipment: HEP, posture, manual trigger point release,   Theraband  Plan: Patient agrees to discharge.  Patient goals were partially met. Patient is being discharged due to being pleased with the current functional level.  ?????    Kristoffer Leamon PT, DPT, LAT, ATC  04/08/16  12:04 PM

## 2016-04-08 ENCOUNTER — Ambulatory Visit: Payer: Commercial Managed Care - HMO | Admitting: Physical Therapy

## 2016-04-08 DIAGNOSIS — R2689 Other abnormalities of gait and mobility: Secondary | ICD-10-CM | POA: Diagnosis not present

## 2016-04-08 DIAGNOSIS — M6281 Muscle weakness (generalized): Secondary | ICD-10-CM | POA: Diagnosis not present

## 2016-04-08 DIAGNOSIS — M542 Cervicalgia: Secondary | ICD-10-CM | POA: Diagnosis not present

## 2016-04-08 DIAGNOSIS — G8929 Other chronic pain: Secondary | ICD-10-CM | POA: Diagnosis not present

## 2016-04-08 DIAGNOSIS — M5441 Lumbago with sciatica, right side: Secondary | ICD-10-CM | POA: Diagnosis not present

## 2016-04-08 DIAGNOSIS — R2681 Unsteadiness on feet: Secondary | ICD-10-CM | POA: Diagnosis not present

## 2016-04-09 IMAGING — MR MR LUMBAR SPINE W/O CM
1 series · 18 of 39 positions shown · non-contrast
Comparison: Lumbar MRI 02/17/2012.  Lumbar radiographs 11/29/2013.

CLINICAL DATA: 63-year-old female with low back pain radiating to
the right hip and lower extremity with numbness. Initial encounter.

EXAM:
MRI LUMBAR SPINE WITHOUT CONTRAST
TECHNIQUE: Multiplanar, multisequence MR imaging of the lumbar spine was
performed. No intravenous contrast was administered.

[Series 7: T1 · axial · 4.0mm · 0.39mm/px · z∈[-28,+176]mm · 18 of 39 slices shown]
[im 1/39]
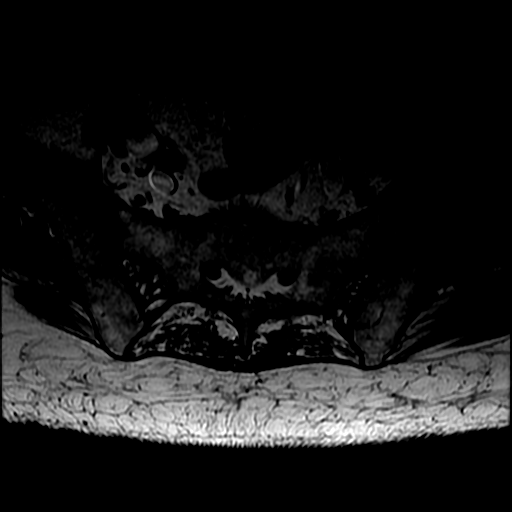
[im 2/39]
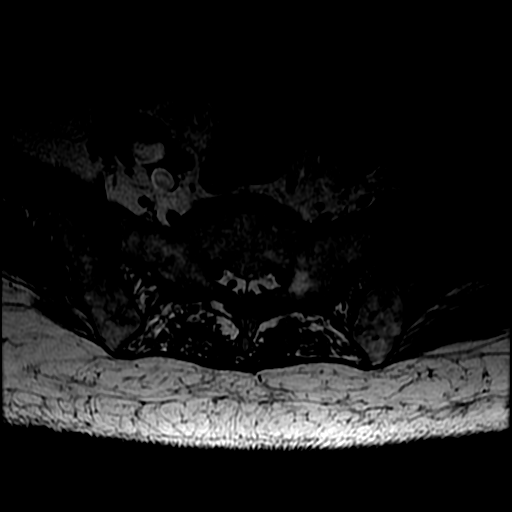
[im 3/39]
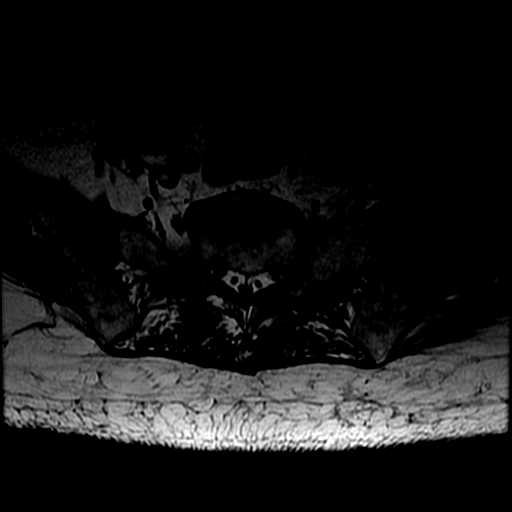
[im 4/39]
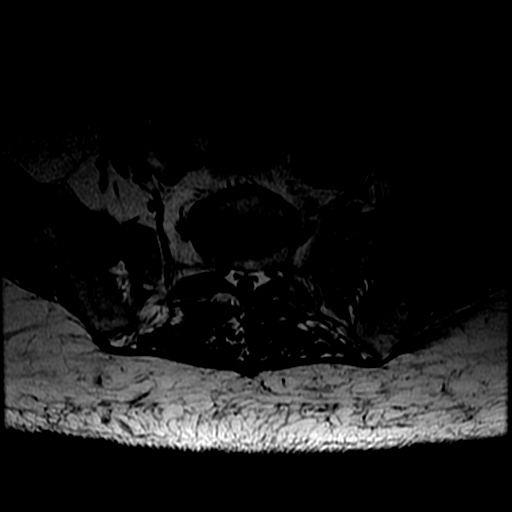
[im 5/39]
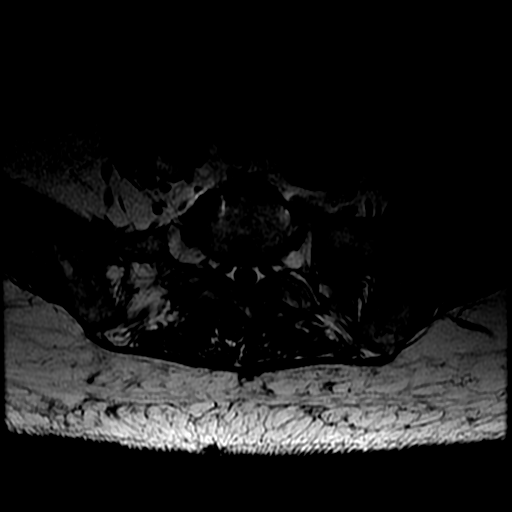
[im 6/39]
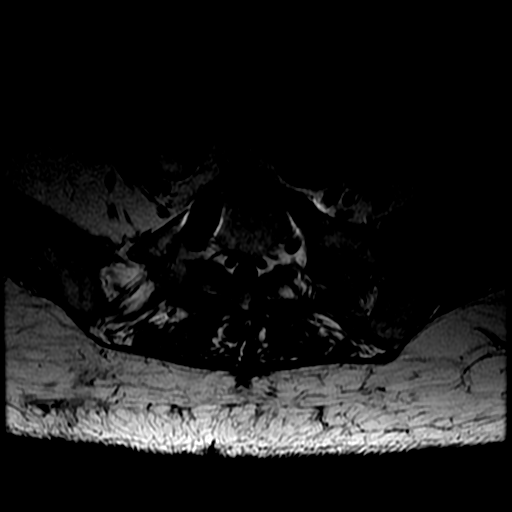
[im 7/39]
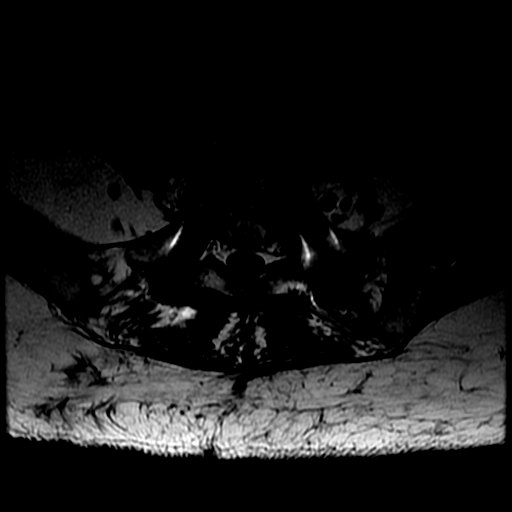
[im 8/39]
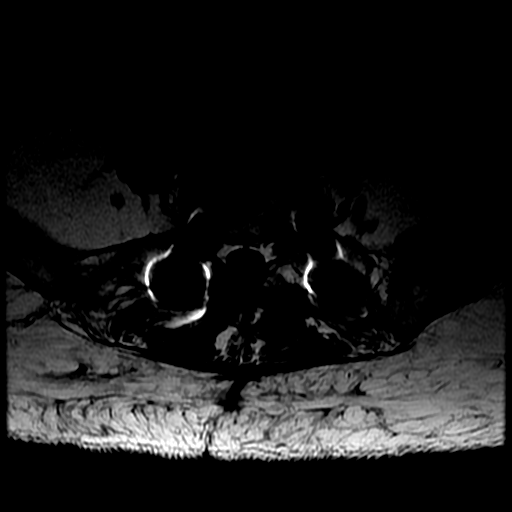
[im 9/39]
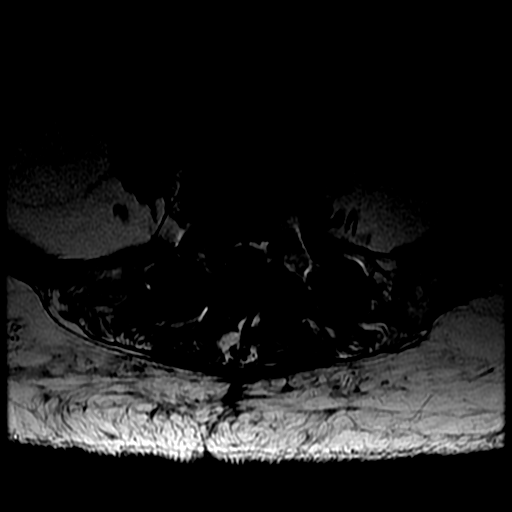
[im 10/39]
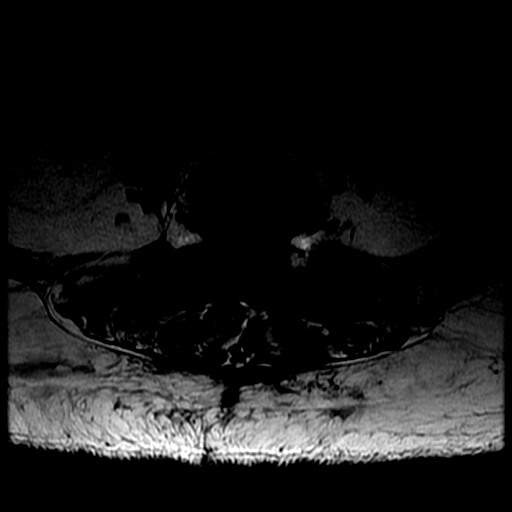
[im 13/39]
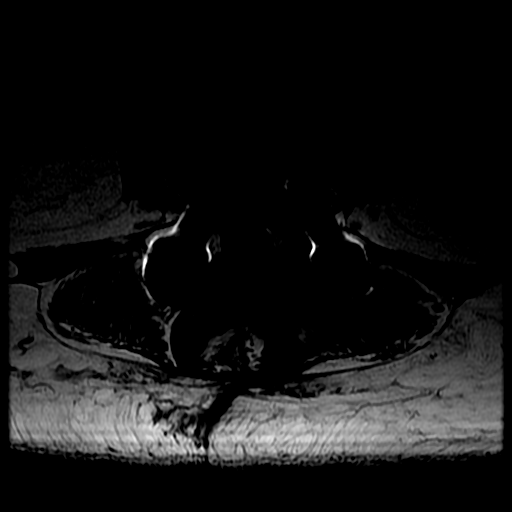
[im 18/39]
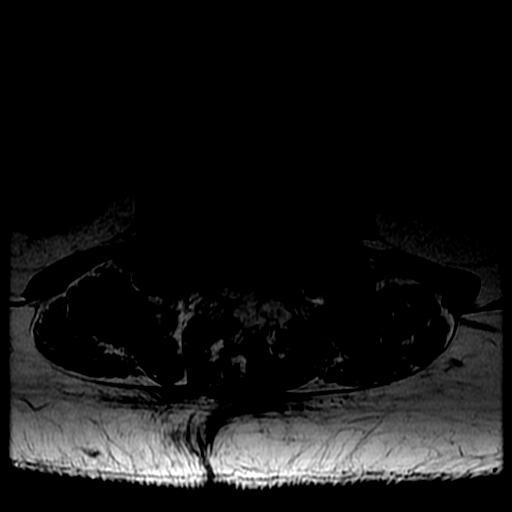
[im 20/39]
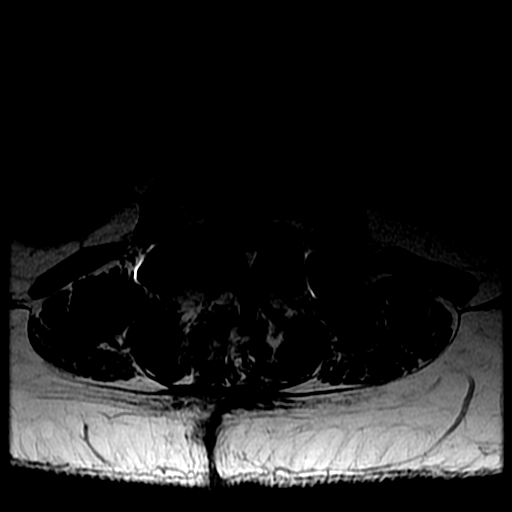
[im 22/39]
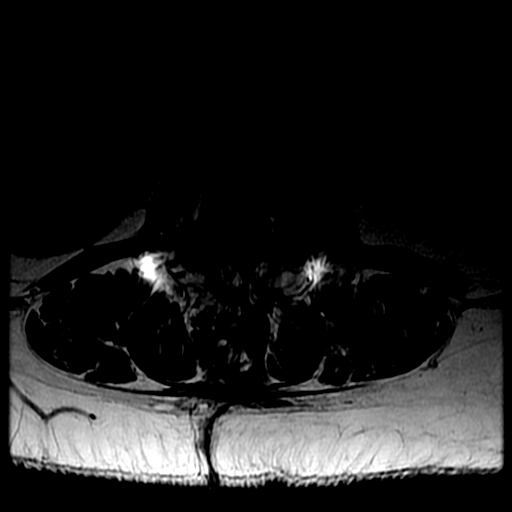
[im 27/39]
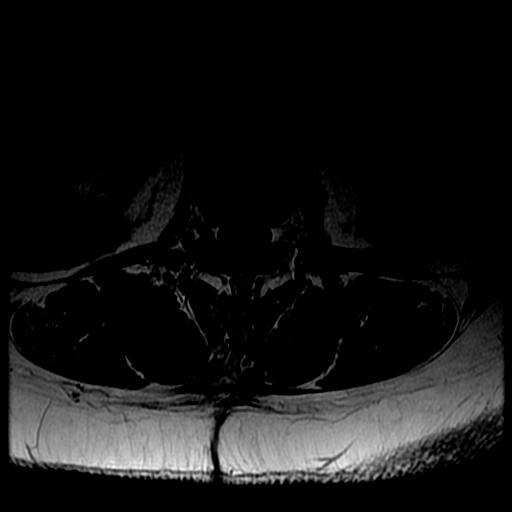
[im 32/39]
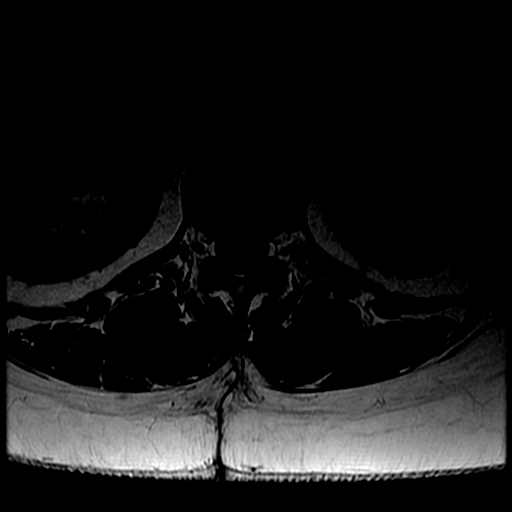
[im 33/39]
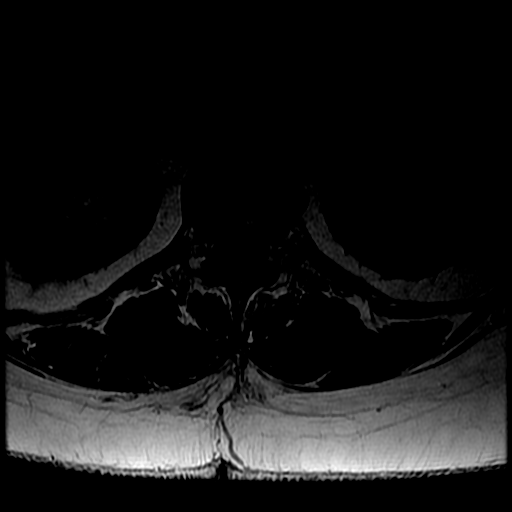
[im 37/39]
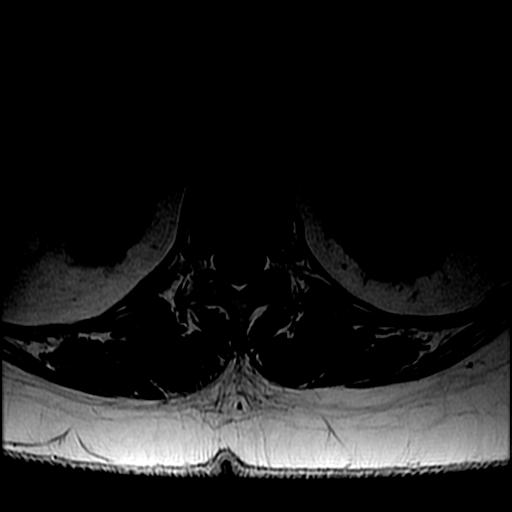

[18 of 39 positions shown; findings below may reference images not displayed]

FINDINGS: Same numbering system as in 8307. Since that time, placement of
posterior and interbody fusion hardware at L4-L5 and L5-S1. Mild
associated hardware susceptibility artifact today. Chronic Mild
spondylolisthesis from L3-L4 (retrolisthesis through L5-S1.
Increased focal lordosis at L3-L4, stable from recent radiographs.
No marrow edema or evidence of acute osseous abnormality.

Visualized lower thoracic spinal cord is normal with conus medularis
at L1-L2.

Postoperative changes to the posterior paraspinal soft tissues.
Small laminectomy space fluid collections occasionally noted (the
largest on the right at L5, series 6, image 27), without mass effect
on the thecal sac most resemble postoperative seromas.

Stable visualized abdominal viscera.

T12-L1:  Stable small central disc protrusion.  No stenosis.

L1-L2:  Negative.

L2-L3: Mildly increased broad-based right paracentral disc
protrusion. Chronic facet hypertrophy appears mildly increased. No
significant spinal stenosis. Mild right lateral recess stenosis is
not significantly changed. No foraminal stenosis.

L3-L4: Increased circumferential disc bulge with new superimposed
caudal disc extrusion best seen on series 6 images 21-23. Severe
facet and ligament flavum hypertrophy is increased. There is a new
small probable synovial cyst just caudal to the disc space and to
the right of midline measuring 4 mm diameter (series 6, image 23).
Combined, there is moderate to severe spinal stenosis with severe
right lateral recess stenosis (descending right L4 nerve root
level). There is also new moderate to severe right L3 and mild left
L3 foraminal stenosis.

L4-L5: Sequelae of decompression and fusion with improved thecal sac
patency. No stenosis identified.

L5-S1: Sequelae of decompression and fusion with improved thecal sac
patency. No stenosis identified.

S1-S2: Vestigial disc space. Mild facet hypertrophy, otherwise
negative.
IMPRESSION: 1. L4-L5 and L5-S1 fusion and decompression since [DATE]. Moderate to severe adjacent segment disease at L3-L4 with
multifactorial severe right lateral recess stenosis, right L3
foraminal stenosis, and moderate to severe overall spinal stenosis.
Caudal subarticular disc extrusion on the right contributes to these
findings.
3. Stable to mild progression of chronic disc and facet degeneration
at L2-L3.

## 2016-04-12 ENCOUNTER — Ambulatory Visit: Payer: Commercial Managed Care - HMO | Admitting: Physical Therapy

## 2016-04-14 ENCOUNTER — Ambulatory Visit: Payer: Commercial Managed Care - HMO | Admitting: Physical Therapy

## 2016-05-01 ENCOUNTER — Other Ambulatory Visit (INDEPENDENT_AMBULATORY_CARE_PROVIDER_SITE_OTHER): Payer: Self-pay | Admitting: Specialist

## 2016-05-03 ENCOUNTER — Other Ambulatory Visit: Payer: Self-pay | Admitting: Family Medicine

## 2016-05-04 DIAGNOSIS — H2512 Age-related nuclear cataract, left eye: Secondary | ICD-10-CM | POA: Diagnosis not present

## 2016-05-04 DIAGNOSIS — H2511 Age-related nuclear cataract, right eye: Secondary | ICD-10-CM | POA: Diagnosis not present

## 2016-05-04 DIAGNOSIS — H524 Presbyopia: Secondary | ICD-10-CM | POA: Diagnosis not present

## 2016-05-04 DIAGNOSIS — H25813 Combined forms of age-related cataract, bilateral: Secondary | ICD-10-CM | POA: Diagnosis not present

## 2016-05-24 ENCOUNTER — Telehealth: Payer: Self-pay | Admitting: Family Medicine

## 2016-05-24 NOTE — Telephone Encounter (Signed)
Patient is calling to see if there are samples of insulin she can get  [phone]

## 2016-05-25 NOTE — Telephone Encounter (Signed)
Requesting Samples of toujeo - samples in fridge and pt aware to pick up

## 2016-05-28 IMAGING — CR DG LUMBAR SPINE 2-3V
2 series · 2 of 2 positions shown · non-contrast
Comparison: Two cross-table lateral intraoperative portable views
of the lumbar spine are submitted. No prior exams for comparison.

CLINICAL DATA: RIGHT L2-L3 herniated nucleus pulposis

EXAM:
LUMBAR SPINE - 2-3 VIEW

[lateral (1 of 2)]
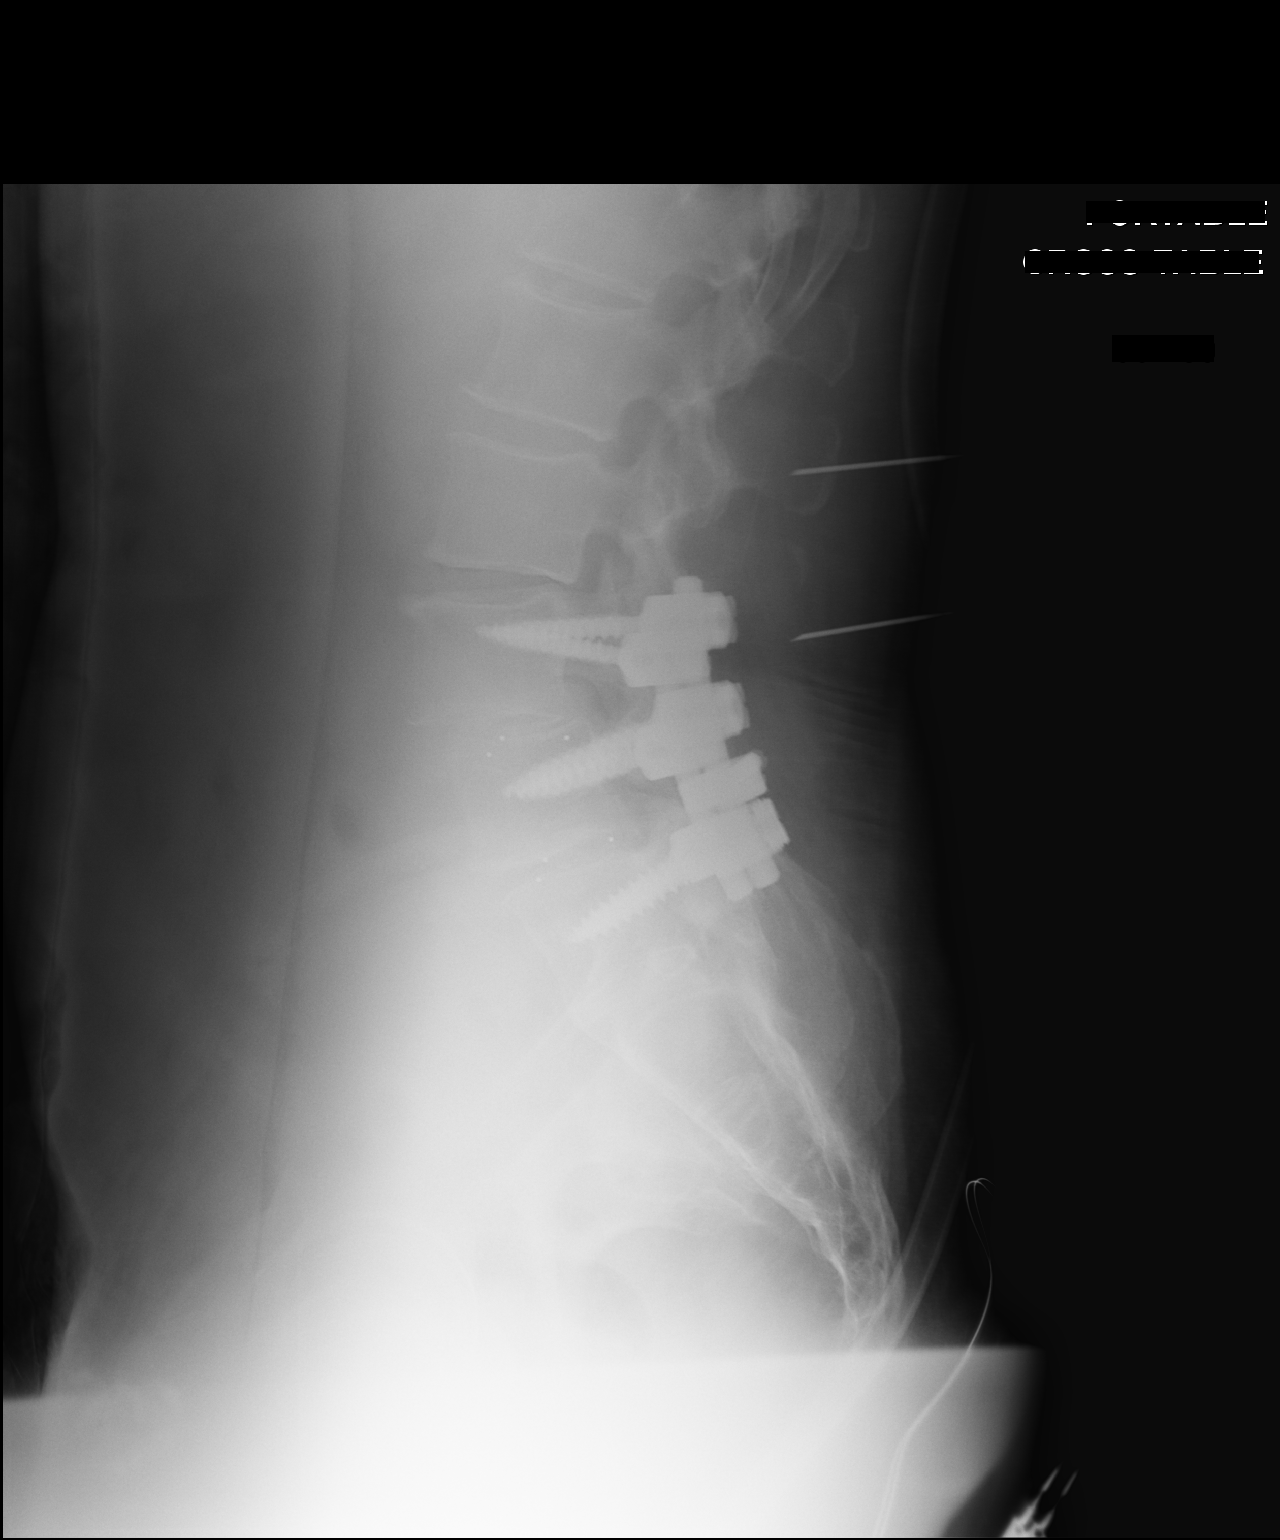

[lateral (2 of 2)]
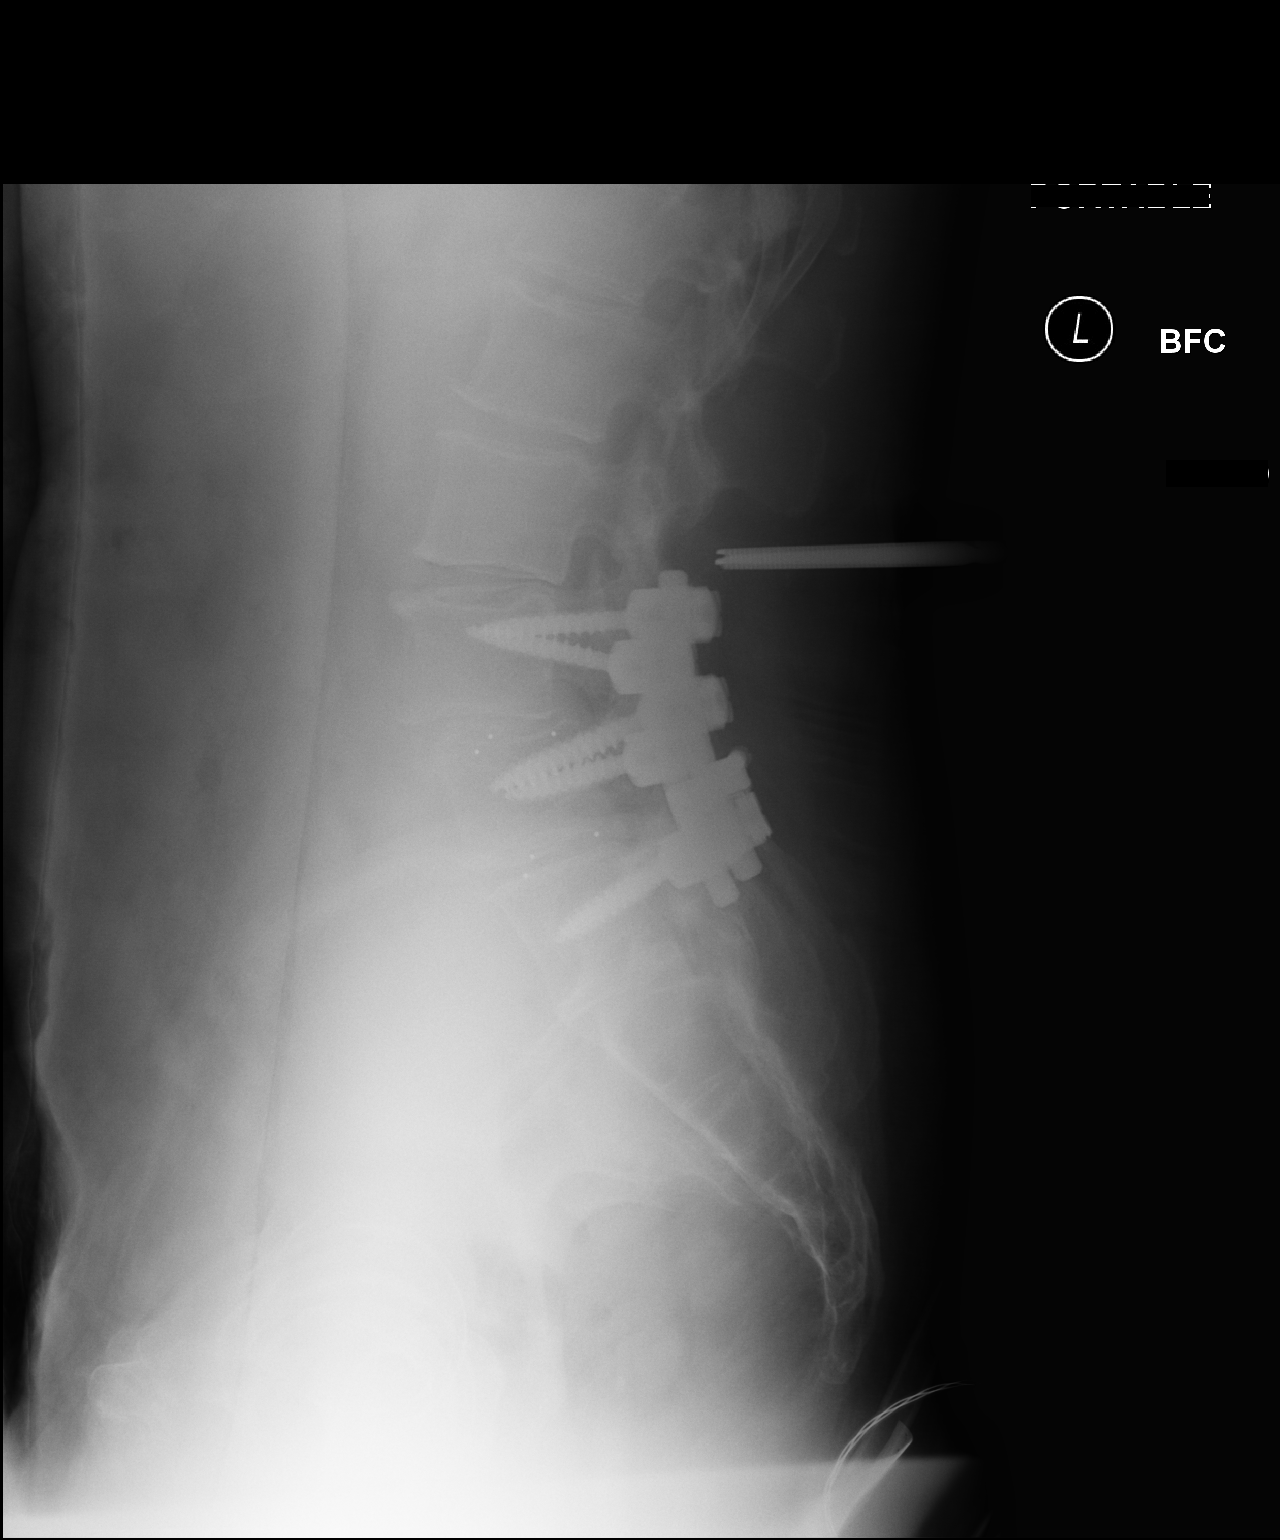

[2 of 2 positions shown; findings below may reference images not displayed]

FINDINGS: Exam is presumptively labeled with the indwelling lumbar hardware
being labeled as L3-L5.

Uncertain if ribs are present at what is labeled T12.

This numbering system should be correlated with any prior outside
imaging patient may have to determine consistency versus prior
studies and accuracy of the surgical level.

Image #1 at 3719 hr: BILATERAL pedicle screws and bars with disc
prostheses at what is labeled L3-L5. Metallic probes via dorsal
approach project dorsal to the L1-L2 disc space and to the mid L3
levels.

Image #2 at 9446 hr: Metallic probe via posterior approach projects
dorsal to the what is labeled as the L2-L3 disc space level. This
disc space is narrowed. Orthopedic hardware at L3-L5 again noted.
IMPRESSION: Presumptive labeling of the lumbar spine with indwelling lumbar
hardware labeled L3-L5.

This should be correlated with any prior imaging the patient may
have as discussed above.

Intraoperative lumbar localization as above.

## 2016-06-07 IMAGING — CT CT ABD-PELV W/ CM
1 of 5 series · 13 of 36 positions shown, 18 images · IV contrast (30CC OMNI 300 & [ID] OMNI 300)
Comparison: CT Abdomen and Pelvis 12/03/2010. Lumbar MRI 12/17/2013

CLINICAL DATA: 63-year-old female with right lower quadrant pain
nausea and vomiting for 3 weeks. Initial encounter. Personal history
of breast cancer, hysterectomy, bilateral oophorectomy.

EXAM:
CT ABDOMEN AND PELVIS WITH CONTRAST
TECHNIQUE: Multidetector CT imaging of the abdomen and pelvis was performed
using the standard protocol following bolus administration of
intravenous contrast.
CONTRAST:  100mL OMNIPAQUE IOHEXOL 300 MG/ML  SOLN

[Series 3: abd/pelvis with · axial · 0.78mm/px · z∈[-440,-30]mm · 13 of 94 slices shown, 18 images]
[im 6/94  soft-tissue]
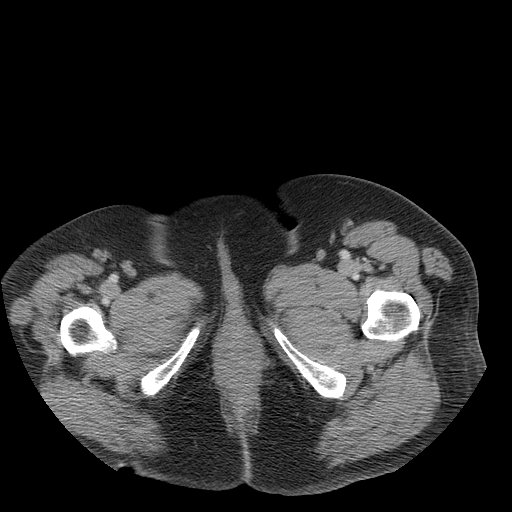
[im 6/94  bone]
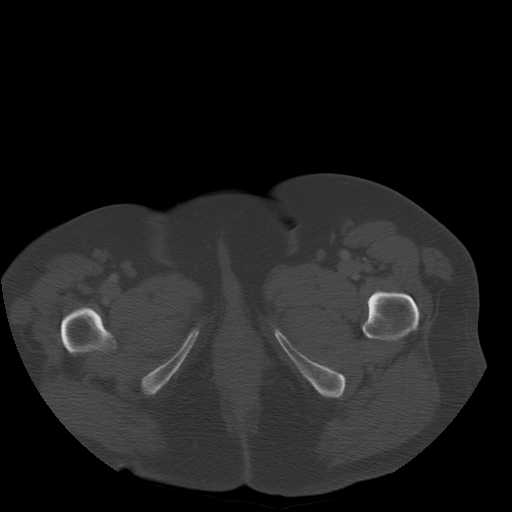
[im 16/94  soft-tissue]
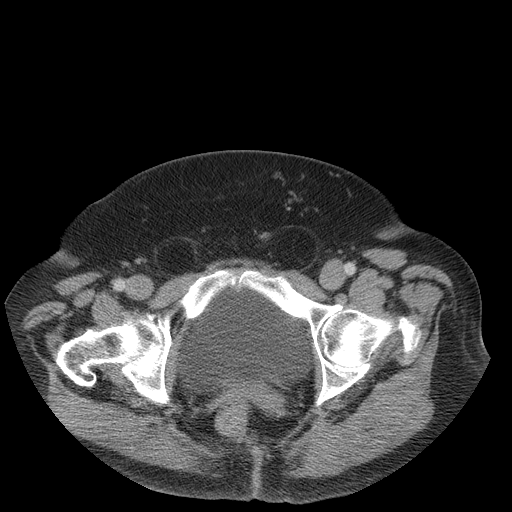
[im 21/94  soft-tissue]
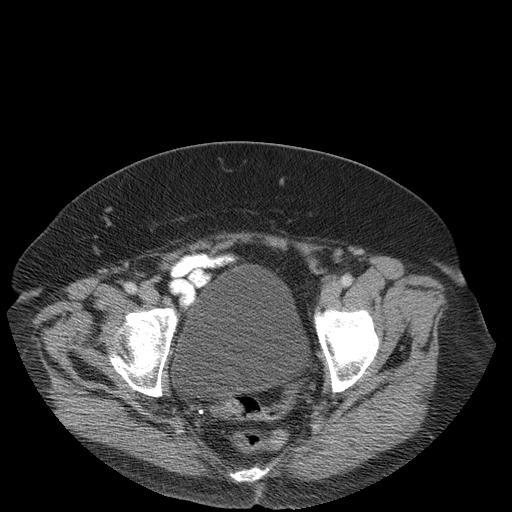
[im 26/94  soft-tissue]
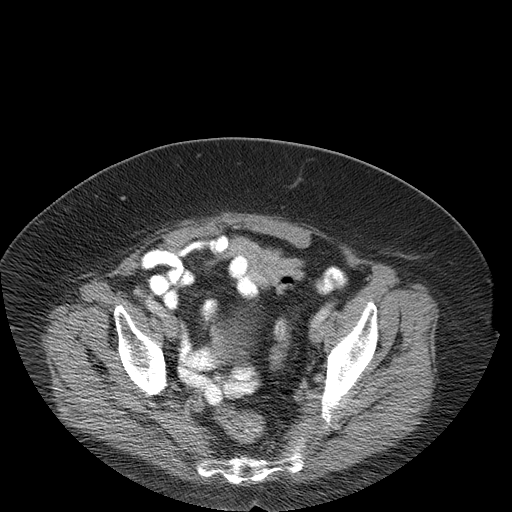
[im 37/94  soft-tissue]
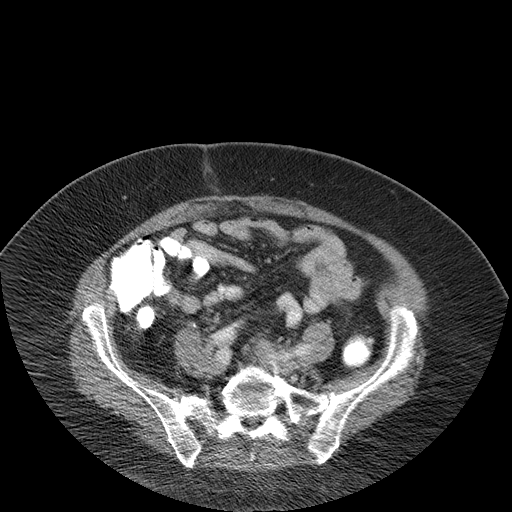
[im 42/94  soft-tissue]
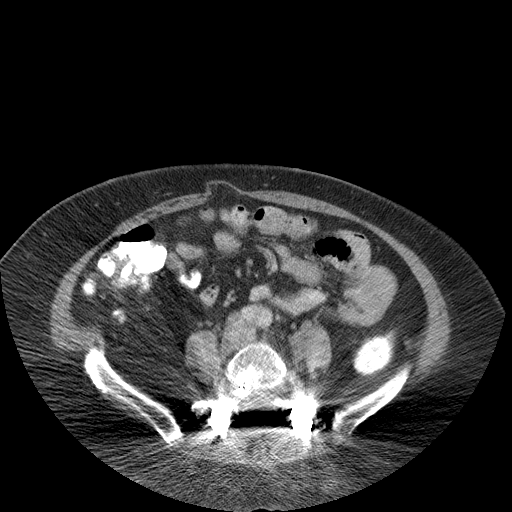
[im 52/94  soft-tissue]
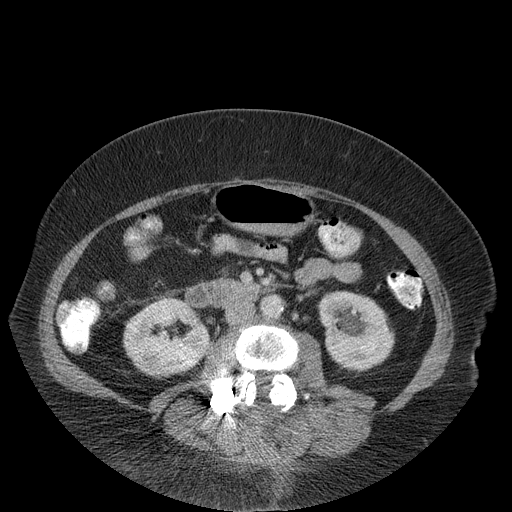
[im 57/94  soft-tissue]
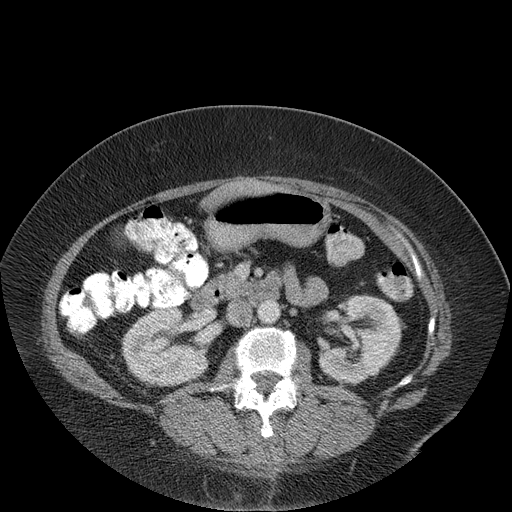
[im 68/94  soft-tissue]
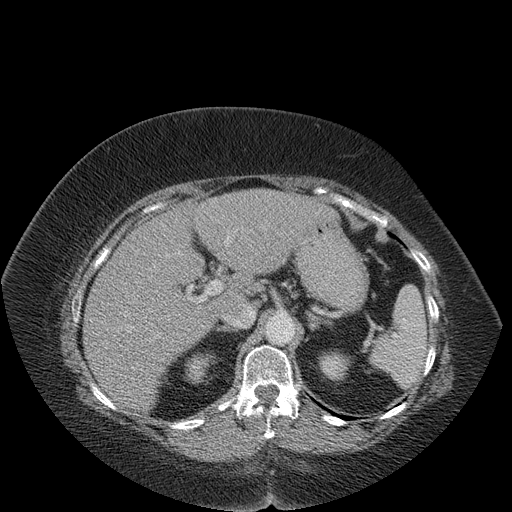
[im 68/94  bone]
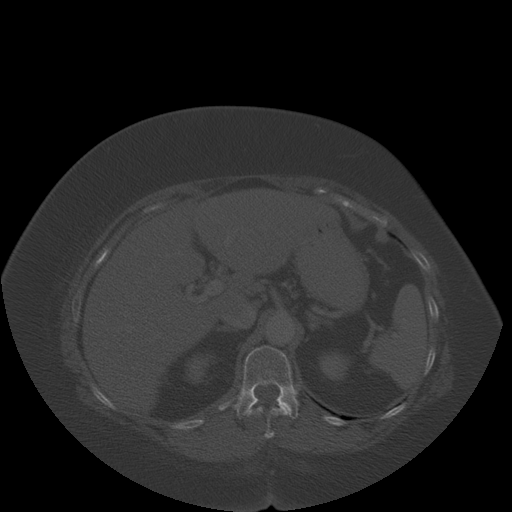
[im 73/94  soft-tissue]
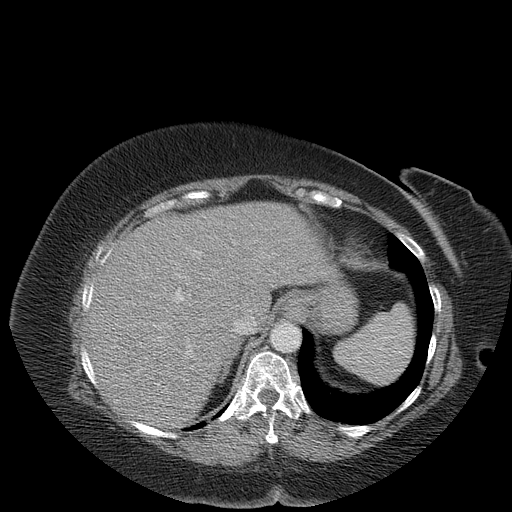
[im 73/94  lung]
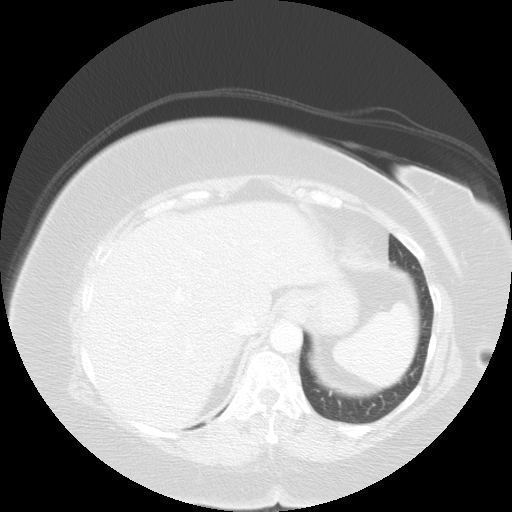
[im 78/94  soft-tissue]
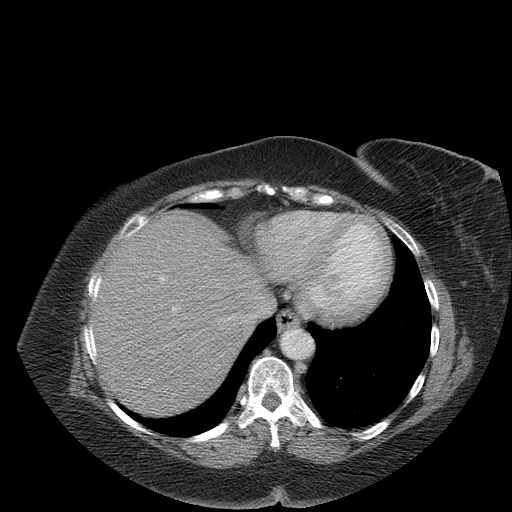
[im 78/94  lung]
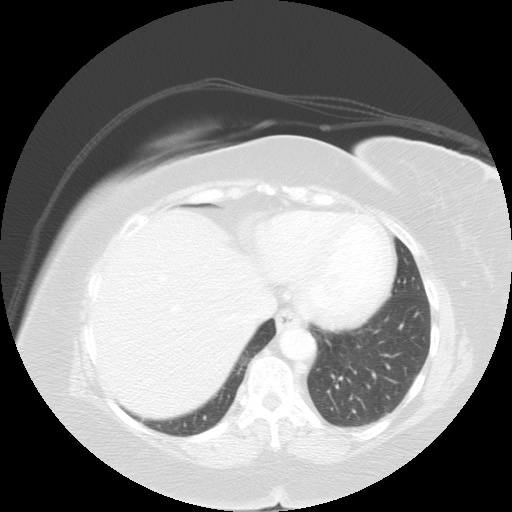
[im 83/94  lung]
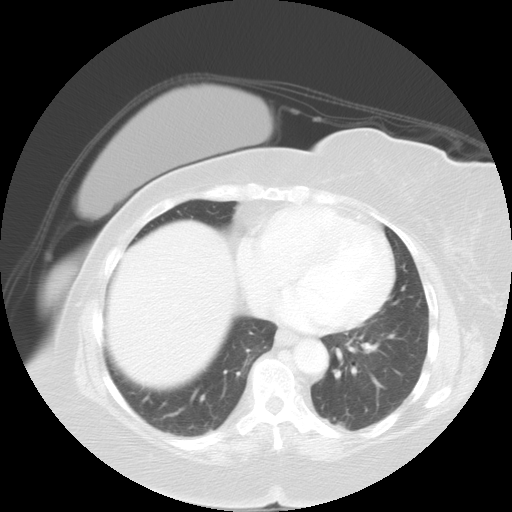
[im 88/94  soft-tissue]
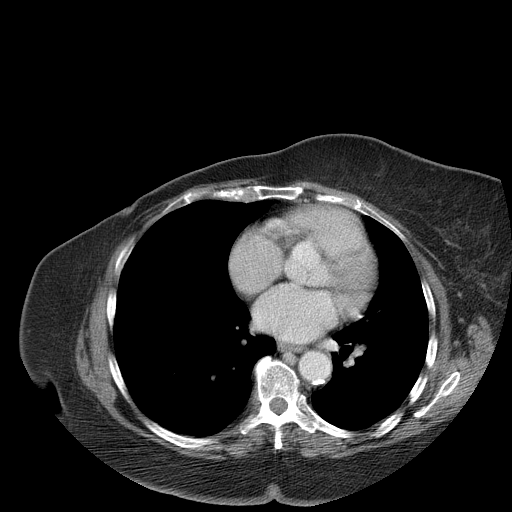
[im 88/94  lung]
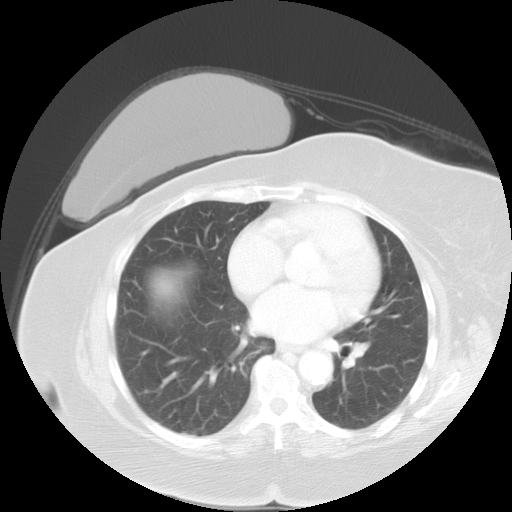

[13 of 36 positions shown; findings below may reference images not displayed]

FINDINGS: Sequelae of right mastectomy. Stable and negative lung bases. Mild
elevation of the right hemidiaphragm. No pericardial or pleural
effusion.

Degenerative and postoperative changes re - identified in the lumbar
spine. See 12/17/2013 comparison. No acute or suspicious osseous
lesion identified.

No pelvic free fluid. Uterus and adnexa surgically absent.
Decompressed mildly redundant distal colon. Mildly distended
bladder. Small fact containing inguinal hernias are unchanged.

Oral contrast has reached the sigmoid colon. Negative left colon.
Negative transverse colon. Mildly redundant but otherwise negative
right colon. Normal appendix. Negative terminal ileum. No dilated
small bowel. Small fact containing umbilical hernia is unchanged.
Negative stomach and duodenum which are largely decompressed.

Mild hepatic steatosis re - identified. No discrete liver lesion.
Gallbladder, spleen, pancreas, adrenal glands, and portal venous
system are within normal limits. Major arterial structures in the
abdomen and pelvis are patent with no significant atherosclerotic
plaque.

Chronic left midpole nephrolithiasis is stable. No hydronephrosis or
perinephric stranding. Renal enhancement and contrast excretion
within normal limits. No abdominal free fluid. No lymphadenopathy.
IMPRESSION: No acute findings and largely unchanged CT appearance of the abdomen
and pelvis since 9599.

Interval postoperative changes to the lumbar spine.

## 2016-06-15 ENCOUNTER — Other Ambulatory Visit: Payer: Self-pay | Admitting: Family Medicine

## 2016-06-15 ENCOUNTER — Encounter: Payer: Self-pay | Admitting: Family Medicine

## 2016-06-15 DIAGNOSIS — H2512 Age-related nuclear cataract, left eye: Secondary | ICD-10-CM | POA: Diagnosis not present

## 2016-06-15 DIAGNOSIS — H25812 Combined forms of age-related cataract, left eye: Secondary | ICD-10-CM | POA: Diagnosis not present

## 2016-06-15 NOTE — Telephone Encounter (Signed)
Received refill request from pharmacy and per medication list pt not taking a lot of meds that are on their list. Pt needs office visit before refills to determine what meds she is actually taking and what needs to be refilled, it has been over 6 months since last ov and labs. Will fax back to pharm and send pt letter.

## 2016-06-16 ENCOUNTER — Ambulatory Visit (INDEPENDENT_AMBULATORY_CARE_PROVIDER_SITE_OTHER): Payer: Commercial Managed Care - HMO | Admitting: Specialist

## 2016-06-30 ENCOUNTER — Ambulatory Visit (INDEPENDENT_AMBULATORY_CARE_PROVIDER_SITE_OTHER): Payer: Commercial Managed Care - HMO | Admitting: Family Medicine

## 2016-06-30 ENCOUNTER — Encounter: Payer: Self-pay | Admitting: Family Medicine

## 2016-06-30 VITALS — BP 130/80 | HR 60 | Temp 98.4°F | Resp 16 | Ht 65.0 in | Wt 196.0 lb

## 2016-06-30 DIAGNOSIS — E1143 Type 2 diabetes mellitus with diabetic autonomic (poly)neuropathy: Secondary | ICD-10-CM | POA: Diagnosis not present

## 2016-06-30 DIAGNOSIS — E039 Hypothyroidism, unspecified: Secondary | ICD-10-CM

## 2016-06-30 DIAGNOSIS — I5032 Chronic diastolic (congestive) heart failure: Secondary | ICD-10-CM | POA: Diagnosis not present

## 2016-06-30 DIAGNOSIS — I10 Essential (primary) hypertension: Secondary | ICD-10-CM

## 2016-06-30 NOTE — Progress Notes (Signed)
Subjective:    Patient ID: [name], female    DOB: 10-17-1950, 66 y.o.   MRN: [id number]  HPI  09/25/15 Her blood pressure today is much better. Her blood pressure at home is in keeping with a number check today. She is between 130 and 140/60-70. I believe these blood pressure values are acceptable. Her fasting blood sugars however are 170-200. She is not checking postprandial sugars. She admits that she's not taking her Lantus regularly. On days that she takes it she takes 45 units twice a day. However she will frequently skip days because she cannot afford the Lantus. Now that she is in the donut hole it's going to cost her $700. She is also not taking the glyxambi regularly because it is causing her just as much. Therefore her sugars fluctuate wildly depending on whether she has or hasn't taken her medication. The joint pains and muscle pains have stopped. However she continues to complain of severe dizziness. She is having trouble with a cane due to her disequilibrium. 6 months ago she did not have to walk with a cane. This is not improving. She denies any vertigo. She denies any unilateral muscle weakness slurred speech, or facial droop but the dizziness is certainly abnormal for this patient. Given her history of hypertension and uncontrolled diabetes she is certainly at higher risk for stroke and possibly this could cause her dizziness if it was in the posterior circulation.  At that time, my plan was:  I'm willing to except the blood pressure. However her sugars are not well controlled nor will they be on the current regimen and she cannot afford that. Therefore we are going to discontinue lantus and glyxambi and replace with novolin 70/30 60 units in the morning and 30 units with supper and recheck fasting blood sugars and two-hour postprandial sugars in one week so that we can titrate further. I'm going to schedule the patient for an MRI of the brain to rule out a stroke in the  posterior circulation which could possibly explain her abnormal coordination and dizziness.  06/30/16 Patient has not been seen since July of last year. As is her pattern, she has discontinued many of her medications and is not managing her blood sugars.  She is taking metformin and toujeo 45 units bid.  She quit glyxambi.  When she does check her sugars it is over 240.  She denies any hypoglycemia Past Medical History:  Diagnosis Date  . Anginal pain ([place]) 03/19/2012   saw Dr. Cathie Olden .. she thinks its more related to stomach issues  . Arthritis    "all over; mostly in my back" (03/20/2012)  . Breast cancer ([place])    "right" (03/20/2012)  . Cardiomyopathy   . Chest pain 06/2005   Hospitalized, dystolic dysfunction  . Chronic lower back pain    "have 2 herniated discs; going to have to have a fusion" (03/20/2012)  . Family history of anesthesia complication    "father has nausea and vomiting"  . GERD (gastroesophageal reflux disease)   . H/O hiatal hernia   . History of kidney stones   . History of right mastectomy   . Hypertension    "patient states never had HTN, takes Hyzaar for heart  . Hypothyroidism   . Irritable bowel syndrome   . Migraines    "it's been a long time since I've had one" (03/20/2012)  . Osteoarthritis   . Peripheral neuropathy ([place])   . PONV (postoperative nausea and  vomiting)   . Sciatica   . Type II diabetes mellitus ([place])    Past Surgical History:  Procedure Laterality Date  . ABDOMINAL HYSTERECTOMY  1993  . adenosin cardiolite  07/2005   (+) wall motion abnormality  . AXILLARY LYMPH NODE DISSECTION  2003   squamous cell cancer  . BACK SURGERY    . CARDIAC CATHETERIZATION  ? 2005 / 2007  . CT abdomen and pelvis  02/2010   Same  . ESOPHAGOGASTRODUODENOSCOPY  2005   GERD  . KNEE ARTHROSCOPY  1990's   "right" (03/20/2012)  . LUMBAR FUSION N/A 08/22/2014   Procedure: Right L2-3 and right L1-2 transforaminal lumbar interbody fusion with pedicle  screws, rods, sleeves, cages, local bone graft, Vivigen, cancellous chips;  Surgeon: Jessy Oto, MD;  Location: [facility];  Service: Orthopedics;  Laterality: N/A;  . LUMBAR LAMINECTOMY/DECOMPRESSION MICRODISCECTOMY N/A 02/04/2014   Procedure: RIGHT L2-3 MICRODISCECTOMY ;  Surgeon: Jessy Oto, MD;  Location: [facility];  Service: Orthopedics;  Laterality: N/A;  . MASTECTOMY WITH AXILLARY LYMPH NODE DISSECTION  12/2003   "right" (03/20/2012)  . POSTERIOR CERVICAL FUSION/FORAMINOTOMY  2001  . SHOULDER ARTHROSCOPY W/ ROTATOR CUFF REPAIR  2003   Left  . THYROIDECTOMY  1977   Right  . TOTAL KNEE ARTHROPLASTY  2000   Right  . [place] of abdomen  07/2005   Fatty liver / kidney stones   Current Outpatient Prescriptions on File Prior to Visit  Medication Sig Dispense Refill  . levothyroxine (SYNTHROID, LEVOTHROID) 100 MCG tablet TAKE 1 TABLET EVERY DAY 30 tablet 5  . aspirin 81 MG tablet Take 81 mg by mouth daily.    . Blood Glucose Calibration (TRUE METRIX LEVEL 2) Normal SOLN Use as directed. (Patient not taking: Reported on 03/18/2016) 3 each 3  . Blood Glucose Monitoring Suppl (BLOOD GLUCOSE SYSTEM PAK) KIT Use as directed to monitor FSBS 3x daily. Dx: E11.9. Please dispense [employer] True Metrix. (Patient not taking: Reported on 03/18/2016) 1 each 1  . Cholecalciferol (VITAMIN D3) 5000 UNITS CAPS Take 5,000 Units by mouth 2 (two) times daily.    . fluticasone (FLONASE) 50 MCG/ACT nasal spray Place 2 sprays into both nostrils daily. 48 g 3  . Glucose Blood (BLOOD GLUCOSE TEST STRIPS) STRP Use as directed to monitor FSBS 3x daily. Dx: E11.9. Please dispense [employer] True Metrix. 300 each 3  . GLYXAMBI 25-5 MG TABS Take 1 tablet by mouth daily.    . insulin NPH-regular Human (NOVOLIN 70/30) (70-30) 100 UNIT/ML injection Inject 60 units with breakfast, Inject 30 units with dinner (Patient not taking: Reported on 03/18/2016) 30 mL 11  . Insulin Pen Needle (PEN NEEDLES 5/16") 30G X 8 MM MISC Use to inject insulin 5x  daily. 200 each 11  . Lancet Devices MISC Use as directed to monitor FSBS 3x daily. Dx: E11.9. Please dispense TruePlus Ultra Thin. 1 each 1  . Lancets MISC Use as directed to monitor FSBS 3x daily. Dx: E11.9. Please dispense TruePlus Ultra Thin. 300 each 3  . losartan-hydrochlorothiazide (HYZAAR) 50-12.5 MG tablet TAKE 1 TABLET EVERY DAY (Patient not taking: Reported on 03/18/2016) 30 tablet 1  . metFORMIN (GLUCOPHAGE) 850 MG tablet Take 1 tablet (850 mg total) by mouth 2 (two) times daily with a meal. 180 tablet 3  . Misc Natural Products (LAXATIVE FORMULA) TABS Take 1 tablet by mouth as needed (for BM).    . ondansetron (ZOFRAN ODT) 4 MG disintegrating tablet Take 1 tablet (4 mg total)  by mouth every 8 (eight) hours as needed for nausea or vomiting. 20 tablet 0  . ondansetron (ZOFRAN) 4 MG tablet Take 1 tablet (4 mg total) by mouth every 6 (six) hours. (Patient not taking: Reported on 03/18/2016) 36 tablet 3  . oxyCODONE-acetaminophen (PERCOCET/ROXICET) 5-325 MG tablet 1 tablet every 6 (six) hours as needed for moderate pain.     . pantoprazole (PROTONIX) 40 MG tablet Take 1 tablet (40 mg total) by mouth daily. (Patient not taking: Reported on 03/18/2016) 30 tablet 3  . pregabalin (LYRICA) 150 MG capsule Take 1 capsule (150 mg total) by mouth 2 (two) times daily. 60 capsule 2  . tamsulosin (FLOMAX) 0.4 MG CAPS capsule Take 1 capsule (0.4 mg total) by mouth daily. (Patient not taking: Reported on 03/18/2016) 30 capsule 0  . traMADol (ULTRAM) 50 MG tablet Take 1 tablet (50 mg total) by mouth every 6 (six) hours as needed for moderate pain. (Patient not taking: Reported on 03/18/2016) 40 tablet 1   No current facility-administered medications on file prior to visit.    Allergies  Allergen Reactions  . Dilaudid [Hydromorphone Hcl] Nausea And Vomiting  . Lipitor [Atorvastatin] Other (See Comments)    Body & Muscle Aches  . Adhesive [Tape] Rash  . Ampicillin Rash    REACTION: Rash and welts on her  hands when she took it in the hospital  . Penicillins Rash   Social History   Social History  . Marital status: Married    Spouse name: N/A  . Number of children: 1  . Years of education: N/A   Occupational History  . Retired since 2003    Social History Main Topics  . Smoking status: Never Smoker  . Smokeless tobacco: Never Used  . Alcohol use No  . Drug use: No  . Sexual activity: Not on file   Other Topics Concern  . Not on file   Social History Narrative  . No narrative on file      Review of Systems  All other systems reviewed and are negative.      Objective:   Physical Exam  Constitutional: She is oriented to person, place, and time. She appears well-developed and well-nourished. No distress.  Neck: No JVD present.  Cardiovascular: Normal rate, regular rhythm and normal heart sounds.   Pulmonary/Chest: Effort normal. She has no wheezes. She has no rales. She exhibits no tenderness.  Abdominal: Soft. Bowel sounds are normal. She exhibits no distension. There is no tenderness. There is no rebound and no guarding.  Musculoskeletal: She exhibits no edema.  Neurological: She is alert and oriented to person, place, and time. She has normal reflexes. No cranial nerve deficit. She exhibits normal muscle tone. Coordination normal.  Skin: She is not diaphoretic.  Vitals reviewed.         Assessment & Plan:   Essential hypertension  Chronic diastolic heart failure (HCC)  Hypothyroidism, unspecified type  Type 2 diabetes mellitus with diabetic autonomic neuropathy, without long-term current use of insulin (HCC) A very sweet lady been extremely noncompliant.  The only reason she is even here today is because we would not refill her medications. Her blood pressures well controlled. I will obtain baseline labs including an A1c, fasting lipid panel, TSH, CMP, and urine microalbumin to determine how controlled her conditions are. Although her blood pressures well  controlled, I am not sure that her cholesterol is at goal. I am almost certain her O5F is not at goal. Her A1c is  less than 9, I would resume glyxambi. This patient is not going to perform mealtime insulin or carb counting. She is to noncompliant and does not check her sugars regularly enough to do this safely

## 2016-07-01 LAB — COMPLETE METABOLIC PANEL WITH GFR
ALT: 14 U/L (ref 6–29)
AST: 16 U/L (ref 10–35)
Albumin: 3.9 g/dL (ref 3.6–5.1)
Alkaline Phosphatase: 60 U/L (ref 33–130)
BILIRUBIN TOTAL: 0.4 mg/dL (ref 0.2–1.2)
BUN: 17 mg/dL (ref 7–25)
CO2: 22 mmol/L (ref 20–31)
CREATININE: 0.8 mg/dL (ref 0.50–0.99)
Calcium: 10.4 mg/dL (ref 8.6–10.4)
Chloride: 102 mmol/L (ref 98–110)
GFR, EST AFRICAN AMERICAN: 88 mL/min (ref >=60)
GFR, EST NON AFRICAN AMERICAN: 77 mL/min (ref >=60)
GLUCOSE: 307 mg/dL — AB (ref 70–99)
POTASSIUM: 4 mmol/L (ref 3.5–5.3)
SODIUM: 137 mmol/L (ref 135–146)
Total Protein: 7.4 g/dL (ref 6.1–8.1)

## 2016-07-01 LAB — CBC WITH DIFFERENTIAL/PLATELET
BASOS ABS: 67 {cells}/uL (ref 0–200)
Basophils Relative: 1 %
EOS PCT: 2 %
Eosinophils Absolute: 134 cells/uL (ref 15–500)
HCT: 46.1 % — ABNORMAL HIGH (ref 35.0–45.0)
Hemoglobin: 14.8 g/dL (ref 12.0–15.0)
Lymphocytes Relative: 31 %
Lymphs Abs: 2077 cells/uL (ref 850–3900)
MCH: 29.3 pg (ref 27.0–33.0)
MCHC: 32.1 g/dL (ref 32.0–36.0)
MCV: 91.3 fL (ref 80.0–100.0)
MONOS PCT: 10 %
MPV: 10 fL (ref 7.5–12.5)
Monocytes Absolute: 670 cells/uL (ref 200–950)
NEUTROS PCT: 56 %
Neutro Abs: 3752 cells/uL (ref 1500–7800)
PLATELETS: 271 10*3/uL (ref 140–400)
RBC: 5.05 MIL/uL (ref 3.80–5.10)
RDW: 14 % (ref 11.0–15.0)
WBC: 6.7 10*3/uL (ref 3.8–10.8)

## 2016-07-01 LAB — LIPID PANEL
CHOLESTEROL: 191 mg/dL (ref <200)
HDL: 45 mg/dL — ABNORMAL LOW (ref >50)
LDL CALC: 97 mg/dL (ref <100)
Total CHOL/HDL Ratio: 4.2 Ratio (ref <5.0)
Triglycerides: 245 mg/dL — ABNORMAL HIGH (ref <150)
VLDL: 49 mg/dL — ABNORMAL HIGH (ref <30)

## 2016-07-01 LAB — TSH: TSH: 2.31 m[IU]/L

## 2016-07-01 LAB — HEMOGLOBIN A1C
HEMOGLOBIN A1C: 11.6 % — AB (ref <5.7)
MEAN PLASMA GLUCOSE: 286 mg/dL

## 2016-07-01 LAB — MICROALBUMIN, URINE: Microalb, Ur: 38.3 mg/dL

## 2016-07-04 ENCOUNTER — Other Ambulatory Visit: Payer: Self-pay | Admitting: Family Medicine

## 2016-07-04 DIAGNOSIS — H25811 Combined forms of age-related cataract, right eye: Secondary | ICD-10-CM | POA: Diagnosis not present

## 2016-07-04 DIAGNOSIS — H2511 Age-related nuclear cataract, right eye: Secondary | ICD-10-CM | POA: Diagnosis not present

## 2016-07-05 DIAGNOSIS — Z961 Presence of intraocular lens: Secondary | ICD-10-CM | POA: Diagnosis not present

## 2016-07-07 ENCOUNTER — Other Ambulatory Visit: Payer: Self-pay | Admitting: Family Medicine

## 2016-07-07 DIAGNOSIS — E119 Type 2 diabetes mellitus without complications: Secondary | ICD-10-CM

## 2016-07-07 MED ORDER — INSULIN ASPART 100 UNIT/ML FLEXPEN: 7.0000 [IU] | PEN_INJECTOR | Freq: Three times a day (TID) | SUBCUTANEOUS | 2 refills | Status: DC

## 2016-07-12 ENCOUNTER — Encounter: Payer: Self-pay | Admitting: Family Medicine

## 2016-08-02 ENCOUNTER — Ambulatory Visit: Payer: Commercial Managed Care - HMO | Admitting: Endocrinology

## 2016-08-03 ENCOUNTER — Ambulatory Visit (INDEPENDENT_AMBULATORY_CARE_PROVIDER_SITE_OTHER): Payer: Commercial Managed Care - HMO | Admitting: Specialist

## 2016-08-09 ENCOUNTER — Encounter: Payer: Self-pay | Admitting: Family Medicine

## 2016-08-10 ENCOUNTER — Encounter: Payer: Self-pay | Admitting: Endocrinology

## 2016-08-10 ENCOUNTER — Ambulatory Visit (INDEPENDENT_AMBULATORY_CARE_PROVIDER_SITE_OTHER): Payer: Commercial Managed Care - HMO | Admitting: Endocrinology

## 2016-08-10 VITALS — BP 140/78 | HR 70 | Ht 65.0 in | Wt 198.0 lb

## 2016-08-10 DIAGNOSIS — E1142 Type 2 diabetes mellitus with diabetic polyneuropathy: Secondary | ICD-10-CM

## 2016-08-10 DIAGNOSIS — Z794 Long term (current) use of insulin: Secondary | ICD-10-CM | POA: Diagnosis not present

## 2016-08-10 DIAGNOSIS — I10 Essential (primary) hypertension: Secondary | ICD-10-CM

## 2016-08-10 MED ORDER — INSULIN ASPART 100 UNIT/ML FLEXPEN: 15.0000 [IU] | PEN_INJECTOR | Freq: Three times a day (TID) | SUBCUTANEOUS | 2 refills | Status: DC

## 2016-08-10 NOTE — Progress Notes (Signed)
Subjective:    Patient ID: [name], female    DOB: 1950-09-09, 66 y.o.   MRN: [id number]  HPI pt is referred by Dr Dennard Schaumann, for diabetes.  Pt states DM was dx'ed in 1986; she has moderate neuropathy of the lower extremities, and assoc pain; she is unaware of any associated chronic complications; she has been on insulin since soon after dx; pt says her diet and exercise are not good; she has never had GDM, pancreatitis, pancreatic surgery, severe hypoglycemia or DKA.  She takes multiple daily injections, and metformin.  She says cbg's are all in the 200's.    Past Medical History:  Diagnosis Date  . Anginal pain ([place]) 03/19/2012   saw Dr. Cathie Olden .. she thinks its more related to stomach issues  . Arthritis    "all over; mostly in my back" (03/20/2012)  . Breast cancer ([place])    "right" (03/20/2012)  . Cardiomyopathy   . Chest pain 06/2005   Hospitalized, dystolic dysfunction  . Chronic lower back pain    "have 2 herniated discs; going to have to have a fusion" (03/20/2012)  . Family history of anesthesia complication    "father has nausea and vomiting"  . GERD (gastroesophageal reflux disease)   . H/O hiatal hernia   . History of kidney stones   . History of right mastectomy   . Hypertension    "patient states never had HTN, takes Hyzaar for heart  . Hypothyroidism   . Irritable bowel syndrome   . Migraines    "it's been a long time since I've had one" (03/20/2012)  . Osteoarthritis   . Peripheral neuropathy   . PONV (postoperative nausea and vomiting)   . Sciatica   . Type II diabetes mellitus ([place])     Past Surgical History:  Procedure Laterality Date  . ABDOMINAL HYSTERECTOMY  1993  . adenosin cardiolite  07/2005   (+) wall motion abnormality  . AXILLARY LYMPH NODE DISSECTION  2003   squamous cell cancer  . BACK SURGERY    . CARDIAC CATHETERIZATION  ? 2005 / 2007  . CT abdomen and pelvis  02/2010   Same  . ESOPHAGOGASTRODUODENOSCOPY  2005   GERD  . KNEE  ARTHROSCOPY  1990's   "right" (03/20/2012)  . LUMBAR FUSION N/A 08/22/2014   Procedure: Right L2-3 and right L1-2 transforaminal lumbar interbody fusion with pedicle screws, rods, sleeves, cages, local bone graft, Vivigen, cancellous chips;  Surgeon: Jessy Oto, MD;  Location: [facility];  Service: Orthopedics;  Laterality: N/A;  . LUMBAR LAMINECTOMY/DECOMPRESSION MICRODISCECTOMY N/A 02/04/2014   Procedure: RIGHT L2-3 MICRODISCECTOMY ;  Surgeon: Jessy Oto, MD;  Location: [place];  Service: Orthopedics;  Laterality: N/A;  . MASTECTOMY WITH AXILLARY LYMPH NODE DISSECTION  12/2003   "right" (03/20/2012)  . POSTERIOR CERVICAL FUSION/FORAMINOTOMY  2001  . SHOULDER ARTHROSCOPY W/ ROTATOR CUFF REPAIR  2003   Left  . THYROIDECTOMY  1977   Right  . TOTAL KNEE ARTHROPLASTY  2000   Right  . [place] of abdomen  07/2005   Fatty liver / kidney stones    Social History   Social History  . Marital status: Married    Spouse name: N/A  . Number of children: 1  . Years of education: N/A   Occupational History  . Retired since 2003    Social History Main Topics  . Smoking status: Never Smoker  . Smokeless tobacco: Never Used  . Alcohol use No  .  Drug use: No  . Sexual activity: Not on file   Other Topics Concern  . Not on file   Social History Narrative  . No narrative on file    Current Outpatient Prescriptions on File Prior to Visit  Medication Sig Dispense Refill  . aspirin 81 MG tablet Take 81 mg by mouth daily.    . Blood Glucose Calibration (TRUE METRIX LEVEL 2) Normal SOLN Use as directed. 3 each 3  . Blood Glucose Monitoring Suppl (BLOOD GLUCOSE SYSTEM PAK) KIT Use as directed to monitor FSBS 3x daily. Dx: E11.9. Please dispense [employer] True Metrix. 1 each 1  . Cholecalciferol (VITAMIN D3) 5000 UNITS CAPS Take 5,000 Units by mouth 2 (two) times daily.    . fluticasone (FLONASE) 50 MCG/ACT nasal spray Place 2 sprays into both nostrils daily. 48 g 3  . gabapentin (NEURONTIN) 300 MG  capsule Take 300 mg by mouth 3 (three) times daily.    . Glucose Blood (BLOOD GLUCOSE TEST STRIPS) STRP Use as directed to monitor FSBS 3x daily. Dx: E11.9. Please dispense [employer] True Metrix. 300 each 3  . Insulin Glargine (TOUJEO SOLOSTAR) 300 UNIT/ML SOPN Inject 45 Units into the skin 2 (two) times daily.    . Insulin Pen Needle (PEN NEEDLES 5/16") 30G X 8 MM MISC Use to inject insulin 5x daily. 200 each 11  . Lancet Devices MISC Use as directed to monitor FSBS 3x daily. Dx: E11.9. Please dispense TruePlus Ultra Thin. 1 each 1  . Lancets MISC Use as directed to monitor FSBS 3x daily. Dx: E11.9. Please dispense TruePlus Ultra Thin. 300 each 3  . levothyroxine (SYNTHROID, LEVOTHROID) 100 MCG tablet TAKE 1 TABLET EVERY DAY 30 tablet 5  . losartan-hydrochlorothiazide (HYZAAR) 50-12.5 MG tablet TAKE 1 TABLET EVERY DAY 30 tablet 1  . metFORMIN (GLUCOPHAGE) 850 MG tablet Take 1 tablet (850 mg total) by mouth 2 (two) times daily with a meal. 180 tablet 3  . Misc Natural Products (LAXATIVE FORMULA) TABS Take 1 tablet by mouth as needed (for BM).    . ondansetron (ZOFRAN ODT) 4 MG disintegrating tablet Take 1 tablet (4 mg total) by mouth every 8 (eight) hours as needed for nausea or vomiting. 20 tablet 0  . pantoprazole (PROTONIX) 40 MG tablet Take 1 tablet (40 mg total) by mouth daily. (Patient taking differently: Take 40 mg by mouth daily as needed. ) 30 tablet 3  . oxyCODONE-acetaminophen (PERCOCET/ROXICET) 5-325 MG tablet 1 tablet every 6 (six) hours as needed for moderate pain.     . traMADol (ULTRAM) 50 MG tablet Take 1 tablet (50 mg total) by mouth every 6 (six) hours as needed for moderate pain. (Patient not taking: Reported on 08/10/2016) 40 tablet 1   No current facility-administered medications on file prior to visit.     Allergies  Allergen Reactions  . Dilaudid [Hydromorphone Hcl] Nausea And Vomiting  . Lipitor [Atorvastatin] Other (See Comments)    Body & Muscle Aches  . Adhesive [Tape]  Rash  . Ampicillin Rash    REACTION: Rash and welts on her hands when she took it in the hospital  . Penicillins Rash    Family History  Problem Relation Age of Onset  . Hypertension Mother   . Diabetes Father   . Hypertension Father   . Hypertension Sister   . Hypertension Brother   . Hypertension Brother   . Diabetes Brother   . Diabetes Other     Family Hx. of Diabetes  .  Hypertension Other     Family History of HTN   BP 140/78   Pulse 70   Ht '5\' 5"'  (1.651 m)   Wt 198 lb (89.8 kg)   SpO2 95%   BMI 32.95 kg/m   Review of Systems denies weight loss, blurry vision, headache, chest pain, sob, n/v, muscle cramps, excessive diaphoresis, memory loss, cold intolerance, rhinorrhea, and easy bruising.  She has chronic low back pain and frequent urination.     Objective:   Physical Exam VS: see vs page GEN: no distress HEAD: head: no deformity eyes: no periorbital swelling, no proptosis.  external nose and ears are normal mouth: no lesion seen NECK: supple, thyroid is not enlarged CHEST WALL: no deformity LUNGS: clear to auscultation CV: reg rate and rhythm, no murmur ABD: abdomen is soft, nontender.  no hepatosplenomegaly.  not distended.  no hernia MUSCULOSKELETAL: muscle bulk and strength are grossly normal.  no obvious joint swelling.  gait is steady with a cane.  Old healed surgical scar at the lower back EXTEMITIES: no deformity.  no ulcer on the feet.  feet are of normal color and temp.  1+ bilat leg edema PULSES: dorsalis pedis intact bilat.  no carotid bruit NEURO:  cn 2-12 grossly intact.   readily moves all 4's.  sensation is intact to touch on the feet, but decreased from normal SKIN:  Normal texture and temperature.  No rash or suspicious lesion is visible.   NODES:  None palpable at the neck PSYCH: alert, well-oriented.  Does not appear anxious nor depressed.  Lab Results  Component Value Date   HGBA1C 11.6 (H) 06/30/2016   I personally reviewed  electrocardiogram tracing (08/31/15): Indication: chest pain Impression: NSR.  No MI.  LVH. Compared to 2016: fusion complexes are resolved  I have reviewed outside records, and summarized: Pt was noted to have elevated a1c, and referred here.  She was also noted to have HTN, med noncompliance, and unsteadiness on feet.       Assessment & Plan:  Insulin-requiring type 2 DM, with polyneuropathy: severe exacerbation.    HTN: control is improved h/o med noncompliance: she may not be a candidate for ongoing multiple daily injections--we'll follow this.  Patient Instructions  good diet and exercise significantly improve the control of your diabetes.  please let me know if you wish to be referred to a dietician.  high blood sugar is very risky to your health.  you should see an eye doctor and dentist every year.  It is very important to get all recommended vaccinations.  Controlling your blood pressure and cholesterol drastically reduces the damage diabetes does to your body.  Those who smoke should quit.  Please discuss these with your doctor.  check your blood sugar twice a day.  vary the time of day when you check, between before the 3 meals, and at bedtime.  also check if you have symptoms of your blood sugar being too high or too low.  please keep a record of the readings and bring it to your next appointment here (or you can bring the meter itself).  You can write it on any piece of paper.  please call us sooner if your blood sugar goes below 70, or if you have a lot of readings over 200.   For now, please continue the same toujeo and metformin, and: Increase the novolog to 15 units 3 times a day (just before each meal).  Please call us next week, to tell  us how the blood sugar is doing.  Please come back for a follow-up appointment in 4 weeks.

## 2016-08-10 NOTE — Patient Instructions (Addendum)
good diet and exercise significantly improve the control of your diabetes.  please let me know if you wish to be referred to a dietician.  high blood sugar is very risky to your health.  you should see an eye doctor and dentist every year.  It is very important to get all recommended vaccinations.  Controlling your blood pressure and cholesterol drastically reduces the damage diabetes does to your body.  Those who smoke should quit.  Please discuss these with your doctor.  check your blood sugar twice a day.  vary the time of day when you check, between before the 3 meals, and at bedtime.  also check if you have symptoms of your blood sugar being too high or too low.  please keep a record of the readings and bring it to your next appointment here (or you can bring the meter itself).  You can write it on any piece of paper.  please call us sooner if your blood sugar goes below 70, or if you have a lot of readings over 200.   For now, please continue the same toujeo and metformin, and: Increase the novolog to 15 units 3 times a day (just before each meal).  Please call us next week, to tell us how the blood sugar is doing.  Please come back for a follow-up appointment in 4 weeks.

## 2016-08-22 DIAGNOSIS — Z1231 Encounter for screening mammogram for malignant neoplasm of breast: Secondary | ICD-10-CM | POA: Diagnosis not present

## 2016-08-22 LAB — HM MAMMOGRAPHY

## 2016-08-29 ENCOUNTER — Encounter: Payer: Self-pay | Admitting: Family Medicine

## 2016-08-31 ENCOUNTER — Encounter: Payer: Self-pay | Admitting: Family Medicine

## 2016-09-07 ENCOUNTER — Telehealth: Payer: Self-pay | Admitting: Endocrinology

## 2016-09-07 ENCOUNTER — Encounter: Payer: Self-pay | Admitting: Endocrinology

## 2016-09-07 ENCOUNTER — Ambulatory Visit (INDEPENDENT_AMBULATORY_CARE_PROVIDER_SITE_OTHER): Payer: Medicare HMO | Admitting: Endocrinology

## 2016-09-07 VITALS — BP 140/82 | HR 61 | Ht 65.0 in | Wt 202.0 lb

## 2016-09-07 DIAGNOSIS — Z794 Long term (current) use of insulin: Secondary | ICD-10-CM | POA: Diagnosis not present

## 2016-09-07 DIAGNOSIS — E1142 Type 2 diabetes mellitus with diabetic polyneuropathy: Secondary | ICD-10-CM | POA: Diagnosis not present

## 2016-09-07 LAB — POCT GLYCOSYLATED HEMOGLOBIN (HGB A1C): HEMOGLOBIN A1C: 11.3

## 2016-09-07 MED ORDER — INSULIN GLARGINE 300 UNIT/ML ~~LOC~~ SOPN: 140.0000 [IU] | PEN_INJECTOR | SUBCUTANEOUS | 11 refills | Status: DC

## 2016-09-07 NOTE — Telephone Encounter (Signed)
error 

## 2016-09-07 NOTE — Progress Notes (Signed)
Subjective:    Patient ID: [name], female    DOB: Apr 01, 1951, 66 y.o.   MRN: [id number]  HPI Pt returns for f/u of diabetes mellitus: DM type: Insulin-requiring type 2 DM, with: Dx'ed: 2637 Complications: painful polyneuropathy Therapy: insulin since soon after dx. GDM: never DKA: never Severe hypoglycemia: never Pancreatitis: never Other: she takes multiple daily injections Interval history: Pt says her diet is poor.  She often skips breakfast.  She never misses the insulin, except when she misses a meal.  She therefore averages 2 doses per day.  no cbg record, but states cbg's vary from 127-200's.   Past Medical History:  Diagnosis Date  . Anginal pain ([place]) 03/19/2012   saw Dr. Cathie Olden .. she thinks its more related to stomach issues  . Arthritis    "all over; mostly in my back" (03/20/2012)  . Breast cancer ([place])    "right" (03/20/2012)  . Cardiomyopathy   . Chest pain 06/2005   Hospitalized, dystolic dysfunction  . Chronic lower back pain    "have 2 herniated discs; going to have to have a fusion" (03/20/2012)  . Family history of anesthesia complication    "father has nausea and vomiting"  . GERD (gastroesophageal reflux disease)   . H/O hiatal hernia   . History of kidney stones   . History of right mastectomy   . Hypertension    "patient states never had HTN, takes Hyzaar for heart  . Hypothyroidism   . Irritable bowel syndrome   . Migraines    "it's been a long time since I've had one" (03/20/2012)  . Osteoarthritis   . Peripheral neuropathy   . PONV (postoperative nausea and vomiting)   . Sciatica   . Type II diabetes mellitus ([place])     Past Surgical History:  Procedure Laterality Date  . ABDOMINAL HYSTERECTOMY  1993  . adenosin cardiolite  07/2005   (+) wall motion abnormality  . AXILLARY LYMPH NODE DISSECTION  2003   squamous cell cancer  . BACK SURGERY    . CARDIAC CATHETERIZATION  ? 2005 / 2007  . CT abdomen and pelvis  02/2010   Same   . ESOPHAGOGASTRODUODENOSCOPY  2005   GERD  . KNEE ARTHROSCOPY  1990's   "right" (03/20/2012)  . LUMBAR FUSION N/A 08/22/2014   Procedure: Right L2-3 and right L1-2 transforaminal lumbar interbody fusion with pedicle screws, rods, sleeves, cages, local bone graft, Vivigen, cancellous chips;  Surgeon: Jessy Oto, MD;  Location: [facility];  Service: Orthopedics;  Laterality: N/A;  . LUMBAR LAMINECTOMY/DECOMPRESSION MICRODISCECTOMY N/A 02/04/2014   Procedure: RIGHT L2-3 MICRODISCECTOMY ;  Surgeon: Jessy Oto, MD;  Location: [facility];  Service: Orthopedics;  Laterality: N/A;  . MASTECTOMY WITH AXILLARY LYMPH NODE DISSECTION  12/2003   "right" (03/20/2012)  . POSTERIOR CERVICAL FUSION/FORAMINOTOMY  2001  . SHOULDER ARTHROSCOPY W/ ROTATOR CUFF REPAIR  2003   Left  . THYROIDECTOMY  1977   Right  . TOTAL KNEE ARTHROPLASTY  2000   Right  . [place] of abdomen  07/2005   Fatty liver / kidney stones    Social History   Social History  . Marital status: Married    Spouse name: N/A  . Number of children: 1  . Years of education: N/A   Occupational History  . Retired since 2003    Social History Main Topics  . Smoking status: Never Smoker  . Smokeless tobacco: Never Used  . Alcohol use No  .  Drug use: No  . Sexual activity: Not on file   Other Topics Concern  . Not on file   Social History Narrative  . No narrative on file    Current Outpatient Prescriptions on File Prior to Visit  Medication Sig Dispense Refill  . aspirin 81 MG tablet Take 81 mg by mouth daily.    . Blood Glucose Calibration (TRUE METRIX LEVEL 2) Normal SOLN Use as directed. 3 each 3  . Blood Glucose Monitoring Suppl (BLOOD GLUCOSE SYSTEM PAK) KIT Use as directed to monitor FSBS 3x daily. Dx: E11.9. Please dispense [employer] True Metrix. 1 each 1  . Cholecalciferol (VITAMIN D3) 5000 UNITS CAPS Take 5,000 Units by mouth 2 (two) times daily.    . fluticasone (FLONASE) 50 MCG/ACT nasal spray Place 2 sprays into both  nostrils daily. 48 g 3  . gabapentin (NEURONTIN) 300 MG capsule Take 300 mg by mouth 3 (three) times daily.    . Glucose Blood (BLOOD GLUCOSE TEST STRIPS) STRP Use as directed to monitor FSBS 3x daily. Dx: E11.9. Please dispense [employer] True Metrix. 300 each 3  . Insulin Pen Needle (PEN NEEDLES 5/16") 30G X 8 MM MISC Use to inject insulin 5x daily. 200 each 11  . Lancet Devices MISC Use as directed to monitor FSBS 3x daily. Dx: E11.9. Please dispense TruePlus Ultra Thin. 1 each 1  . Lancets MISC Use as directed to monitor FSBS 3x daily. Dx: E11.9. Please dispense TruePlus Ultra Thin. 300 each 3  . levothyroxine (SYNTHROID, LEVOTHROID) 100 MCG tablet TAKE 1 TABLET EVERY DAY 30 tablet 5  . losartan-hydrochlorothiazide (HYZAAR) 50-12.5 MG tablet TAKE 1 TABLET EVERY DAY 30 tablet 1  . metFORMIN (GLUCOPHAGE) 850 MG tablet Take 1 tablet (850 mg total) by mouth 2 (two) times daily with a meal. 180 tablet 3  . Misc Natural Products (LAXATIVE FORMULA) TABS Take 1 tablet by mouth as needed (for BM).    . ondansetron (ZOFRAN ODT) 4 MG disintegrating tablet Take 1 tablet (4 mg total) by mouth every 8 (eight) hours as needed for nausea or vomiting. 20 tablet 0  . oxyCODONE-acetaminophen (PERCOCET/ROXICET) 5-325 MG tablet 1 tablet every 6 (six) hours as needed for moderate pain.     . pantoprazole (PROTONIX) 40 MG tablet Take 1 tablet (40 mg total) by mouth daily. (Patient taking differently: Take 40 mg by mouth daily as needed. ) 30 tablet 3  . traMADol (ULTRAM) 50 MG tablet Take 1 tablet (50 mg total) by mouth every 6 (six) hours as needed for moderate pain. 40 tablet 1   No current facility-administered medications on file prior to visit.     Allergies  Allergen Reactions  . Dilaudid [Hydromorphone Hcl] Nausea And Vomiting  . Lipitor [Atorvastatin] Other (See Comments)    Body & Muscle Aches  . Adhesive [Tape] Rash  . Ampicillin Rash    REACTION: Rash and welts on her hands when she took it in the  hospital  . Penicillins Rash    Family History  Problem Relation Age of Onset  . Hypertension Mother   . Diabetes Father   . Hypertension Father   . Hypertension Sister   . Hypertension Brother   . Hypertension Brother   . Diabetes Brother   . Diabetes Other        Family Hx. of Diabetes  . Hypertension Other        Family History of HTN    BP 140/82   Pulse 61  Ht _0  (1.651 m)   Wt 202 lb (91.6 kg)   SpO2 96%   BMI 33.61 kg/m   Review of Systems She denies hypoglycemia.     Objective:   Physical Exam VITAL SIGNS:  See vs page GENERAL: no distress Pulses: dorsalis pedis intact bilat.   MSK: no deformity of the feet CV: 1+ bilat leg edema Skin:  no ulcer on the feet.  normal color and temp on the feet.  Neuro: sensation is intact to touch on the feet, but decreased from normal.     A1c=11.3%    Assessment & Plan:  Insulin-requiring type 2 DM, with polyneuropathy: she declines to continue multiple daily injections.   Patient Instructions  check your blood sugar twice a day.  vary the time of day when you check, between before the 3 meals, and at bedtime.  also check if you have symptoms of your blood sugar being too high or too low.  please keep a record of the readings and bring it to your next appointment here (or you can bring the meter itself).  You can write it on any piece of paper.  please call us sooner if your blood sugar goes below 70, or if you have a lot of readings over 200.   Please stop taking the novolog, and: Increase the toujeo to 140 units each morning.  On this type of insulin schedule, you should eat meals on a regular schedule.  If a meal is missed or significantly delayed, your blood sugar could go low.  Please come back for a follow-up appointment in 6 weeks.

## 2016-09-07 NOTE — Patient Instructions (Addendum)
check your blood sugar twice a day.  vary the time of day when you check, between before the 3 meals, and at bedtime.  also check if you have symptoms of your blood sugar being too high or too low.  please keep a record of the readings and bring it to your next appointment here (or you can bring the meter itself).  You can write it on any piece of paper.  please call us sooner if your blood sugar goes below 70, or if you have a lot of readings over 200.   Please stop taking the novolog, and: Increase the toujeo to 140 units each morning.  On this type of insulin schedule, you should eat meals on a regular schedule.  If a meal is missed or significantly delayed, your blood sugar could go low.  Please come back for a follow-up appointment in 6 weeks.

## 2016-09-12 ENCOUNTER — Telehealth: Payer: Self-pay | Admitting: Endocrinology

## 2016-09-12 ENCOUNTER — Other Ambulatory Visit: Payer: Self-pay

## 2016-09-12 MED ORDER — INSULIN GLARGINE 300 UNIT/ML ~~LOC~~ SOPN: 140.0000 [IU] | PEN_INJECTOR | SUBCUTANEOUS | 2 refills | Status: DC

## 2016-09-12 NOTE — Telephone Encounter (Signed)
Patient is asking that a 30 day of Insulin Glargine (TOUJEO SOLOSTAR) 300 UNIT/ML SOPN [[id number]] be sent to [facility] on [address]. She wants to cancel the [place] mail order for the cost. She has to buy 30 days at a time. Please cancel and resubmit to [facility]

## 2016-09-12 NOTE — Telephone Encounter (Signed)
Submitted for 30 day.

## 2016-09-15 ENCOUNTER — Ambulatory Visit (INDEPENDENT_AMBULATORY_CARE_PROVIDER_SITE_OTHER): Payer: Self-pay | Admitting: Specialist

## 2016-09-15 ENCOUNTER — Encounter (INDEPENDENT_AMBULATORY_CARE_PROVIDER_SITE_OTHER): Payer: Self-pay

## 2016-09-16 ENCOUNTER — Encounter: Payer: Self-pay | Admitting: Dietician

## 2016-09-16 ENCOUNTER — Encounter: Payer: Medicare HMO | Attending: Endocrinology | Admitting: Dietician

## 2016-09-16 DIAGNOSIS — Z713 Dietary counseling and surveillance: Secondary | ICD-10-CM | POA: Diagnosis not present

## 2016-09-16 DIAGNOSIS — Z794 Long term (current) use of insulin: Secondary | ICD-10-CM | POA: Insufficient documentation

## 2016-09-16 DIAGNOSIS — E1142 Type 2 diabetes mellitus with diabetic polyneuropathy: Secondary | ICD-10-CM | POA: Insufficient documentation

## 2016-09-16 NOTE — Progress Notes (Signed)
Diabetes Self-Management Education  Visit Type: First/Initial  Appt. Start Time: 1050 Appt. End Time: 1210  09/16/2016  [name], identified by name and date of birth, is a 66 y.o. female with a diagnosis of Diabetes: Type 2. Other hx includes GERD, HTN, hypothyroidism, and history of breast cancer.  She has struggled with A1C over 11%.  Her am blood sugar readings are over 253.  She was instructed by MD to increase her Toujeo to 140 units each am and only increased this to 50 units because she was fearful to increase it to 140 units.  She continues to take the Metformin.  She is also possibly stretching the Toujeo due to expense.  She is not checking her blood sugar more than one time per day due to increased pain from neuropathy in her hands.  Showed her the [place] and provided pamphlet.  Patient lives with her husband.  Patient does the shopping and cooking.  She is retired from [employer].  ASSESSMENT  Height 5\' 5"  (1.651 m), weight 198 lb (89.8 kg). Body mass index is 32.95 kg/m.      Diabetes Self-Management Education - 09/16/16 1114      Visit Information   Visit Type First/Initial     Initial Visit   Diabetes Type Type 2   Are you currently following a meal plan? No   Are you taking your medications as prescribed? Yes   Date Diagnosed 1988     Health Coping   How would you rate your overall health? Fair     Psychosocial Assessment   Patient Belief/Attitude about Diabetes Denial   Self-care barriers None   Other persons present Patient   Patient Concerns Nutrition/Meal planning;Glycemic Control   Special Needs None   Preferred Learning Style No preference indicated   Learning Readiness Ready   How often do you need to have someone help you when you read instructions, pamphlets, or other written materials from your doctor or pharmacy? 1 - Never   What is the last grade level you completed in school? 12th grade     Pre-Education Assessment   Patient understands  the diabetes disease and treatment process. Needs Review   Patient understands incorporating nutritional management into lifestyle. Needs Review   Patient undertands incorporating physical activity into lifestyle. Needs Review   Patient understands using medications safely. Needs Review   Patient understands monitoring blood glucose, interpreting and using results Needs Review   Patient understands prevention, detection, and treatment of acute complications. Needs Review   Patient understands prevention, detection, and treatment of chronic complications. Needs Review   Patient understands how to develop strategies to address psychosocial issues. Needs Review   Patient understands how to develop strategies to promote health/change behavior. Needs Review     Complications   Last HgB A1C per patient/outside source 11.3 %  09/07/16   How often do you check your blood sugar? 1-2 times/day   Fasting Blood glucose range (mg/dL) >200   Number of hypoglycemic episodes per month 0   Number of hyperglycemic episodes per week 21   Can you tell when your blood sugar is high? No   Have you had a dilated eye exam in the past 12 months? Yes   Have you had a dental exam in the past 12 months? No   Are you checking your feet? Yes   How many days per week are you checking your feet? 7     Dietary Intake   Breakfast often  skips OR protein shake OR V-8 juice OR banana  Never liked breakfast   Snack (morning) none   Lunch Tomato with mayo OR hotdog without bread OR tuna salad with crackers OR popcorn and juice   Snack (afternoon) occasional fruit   Dinner meat and vegetable (baked chicken and turnip greens)  5-6   Snack (evening) usually none   Beverage(s) regular lemonade or water, occasional unsweetened tea with splenda, 12-20 ounces regular coke daily     Exercise   Exercise Type ADL's;Light (walking / raking leaves)  had been walking for about a month but feet are swelling   How many days per week  to you exercise? 1   How many minutes per day do you exercise? 15   Total minutes per week of exercise 15     Patient Education   Previous Diabetes Education Yes (please comment)  a class a while ago   Nutrition management  Role of diet in the treatment of diabetes and the relationship between the three main macronutrients and blood glucose level;Meal options for control of blood glucose level and chronic complications.;Information on hints to eating out and maintain blood glucose control.;Food label reading, portion sizes and measuring food.   Physical activity and exercise  Role of exercise on diabetes management, blood pressure control and cardiac health.   Medications Reviewed patients medication for diabetes, action, purpose, timing of dose and side effects.   Monitoring Purpose and frequency of SMBG.;Identified appropriate SMBG and/or A1C goals.;Daily foot exams;Yearly dilated eye exam   Chronic complications Dental care   Psychosocial adjustment Worked with patient to identify barriers to care and solutions;Role of stress on diabetes;Identified and addressed patients feelings and concerns about diabetes   Personal strategies to promote health Lifestyle issues that need to be addressed for better diabetes care     Individualized Goals (developed by patient)   Nutrition General guidelines for healthy choices and portions discussed   Physical Activity Exercise 3-5 times per week;15 minutes per day   Medications take my medication as prescribed   Monitoring  test my blood glucose as discussed   Problem Solving insulin cost   Reducing Risk do foot checks daily;examine blood glucose patterns   Health Coping discuss diabetes with (comment)  MD/RD     Post-Education Assessment   Patient understands the diabetes disease and treatment process. Needs Review   Patient understands incorporating nutritional management into lifestyle. Needs Review   Patient undertands incorporating physical  activity into lifestyle. Demonstrates understanding / competency   Patient understands using medications safely. Demonstrates understanding / competency   Patient understands monitoring blood glucose, interpreting and using results Demonstrates understanding / competency   Patient understands prevention, detection, and treatment of acute complications. Demonstrates understanding / competency   Patient understands prevention, detection, and treatment of chronic complications. Demonstrates understanding / competency   Patient understands how to develop strategies to address psychosocial issues. Needs Review   Patient understands how to develop strategies to promote health/change behavior. Needs Review     Outcomes   Expected Outcomes Demonstrated interest in learning. Expect positive outcomes   Future DMSE 4-6 wks   Program Status Completed      Individualized Plan for Diabetes Self-Management Training:   Learning Objective:  Patient will have a greater understanding of diabetes self-management. Patient education plan is to attend individual and/or group sessions per assessed needs and concerns.   Plan:   Patient Instructions  Avoid added salt and processed meat (sausage, bacon, ham,  lunch meat).  Choose fresh or frozen vegetables without salt.  Choose canned vegetables without salt or rinse them off.  Choose fresh meat. Read the labels for carbohydrates and sodium.   Increase intake of non starchy vegetables. Choose whole grains, dried beans, sweet potatoes. Rethink what you drink.  Aim for 2-3 Carb Choices per meal (30-45 grams) +/- 1 either way  Aim for 0-1 Carbs per snack if hungry  Include protein in moderation with your meals and snacks Consider  increasing your activity level by walking for 5-10 minutes daily as tolerated.  Increase slowly. Consider checking BG at alternate times per day as directed by MD  Consider taking medication as directed by MD.  If morning blood sugar  remains high, continue to increase the Toujeo.  Dr. Loanne Drilling wrote for 140 units each am.  Consider taking your Multivitamin daily and or a dissolvable vitamin B-12.       Expected Outcomes:  Demonstrated interest in learning. Expect positive outcomes  Education material provided: Living Well with Diabetes, Food label handouts, A1C conversion sheet, Meal plan card, My Plate and Snack sheet, breakfast ideas  If problems or questions, patient to contact team via:  Phone  Future DSME appointment: 4-6 wks

## 2016-09-16 NOTE — Patient Instructions (Signed)
Avoid added salt and processed meat (sausage, bacon, ham, lunch meat).  Choose fresh or frozen vegetables without salt.  Choose canned vegetables without salt or rinse them off.  Choose fresh meat. Read the labels for carbohydrates and sodium.   Increase intake of non starchy vegetables. Choose whole grains, dried beans, sweet potatoes. Rethink what you drink.  Aim for 2-3 Carb Choices per meal (30-45 grams) +/- 1 either way  Aim for 0-1 Carbs per snack if hungry  Include protein in moderation with your meals and snacks Consider  increasing your activity level by walking for 5-10 minutes daily as tolerated.  Increase slowly. Consider checking BG at alternate times per day as directed by MD  Consider taking medication as directed by MD.  If morning blood sugar remains high, continue to increase the Toujeo.  Dr. Loanne Drilling wrote for 140 units each am.  Consider taking your Multivitamin daily and or a dissolvable vitamin B-12.

## 2016-09-20 ENCOUNTER — Other Ambulatory Visit: Payer: Self-pay | Admitting: Family Medicine

## 2016-09-20 ENCOUNTER — Ambulatory Visit (INDEPENDENT_AMBULATORY_CARE_PROVIDER_SITE_OTHER): Payer: Medicare HMO | Admitting: Podiatry

## 2016-09-20 ENCOUNTER — Encounter: Payer: Self-pay | Admitting: Podiatry

## 2016-09-20 DIAGNOSIS — M7752 Other enthesopathy of left foot: Secondary | ICD-10-CM | POA: Diagnosis not present

## 2016-09-20 DIAGNOSIS — M778 Other enthesopathies, not elsewhere classified: Secondary | ICD-10-CM

## 2016-09-20 DIAGNOSIS — I739 Peripheral vascular disease, unspecified: Secondary | ICD-10-CM

## 2016-09-20 DIAGNOSIS — E1142 Type 2 diabetes mellitus with diabetic polyneuropathy: Secondary | ICD-10-CM

## 2016-09-20 DIAGNOSIS — B351 Tinea unguium: Secondary | ICD-10-CM

## 2016-09-20 DIAGNOSIS — M779 Enthesopathy, unspecified: Secondary | ICD-10-CM

## 2016-09-20 DIAGNOSIS — M79676 Pain in unspecified toe(s): Secondary | ICD-10-CM

## 2016-09-20 NOTE — Telephone Encounter (Signed)
Pt needs refill on Gabapentin, levothyroxine, metformin, and the losartan [facility] [address]. Pt needs to know if we can give her toujeo.

## 2016-09-20 NOTE — Progress Notes (Signed)
She presents today chief complaint of painful elongated toenails and painful joint.  Objective: Toenails are long thick L dystrophic with mycotic. She has pain on range of motion metatarsophalangeal joint.  Assessment: Painless onychomycosis and diabetes.. Capsulitis.  Plan: Injected the area today with Kenalog and local anesthetic. I also debrided toenails.

## 2016-09-21 MED ORDER — LEVOTHYROXINE SODIUM 100 MCG PO TABS: 100.0000 ug | ORAL_TABLET | Freq: Every day | ORAL | 5 refills | Status: DC

## 2016-09-21 MED ORDER — LOSARTAN POTASSIUM-HCTZ 50-12.5 MG PO TABS: 1.0000 | ORAL_TABLET | Freq: Every day | ORAL | 1 refills | Status: DC

## 2016-09-21 MED ORDER — METFORMIN HCL 850 MG PO TABS: 850.0000 mg | ORAL_TABLET | Freq: Two times a day (BID) | ORAL | 3 refills | Status: DC

## 2016-09-21 MED ORDER — GABAPENTIN 300 MG PO CAPS: 300.0000 mg | ORAL_CAPSULE | Freq: Three times a day (TID) | ORAL | 3 refills | Status: DC

## 2016-09-21 NOTE — Telephone Encounter (Signed)
Prescription sent to pharmacy.   Sample placed in fridge.   Call placed to patient and patient made aware per VM.

## 2016-09-22 ENCOUNTER — Telehealth: Payer: Self-pay | Admitting: Family Medicine

## 2016-09-22 NOTE — Telephone Encounter (Signed)
PT NEEDS YOU TO CALL HER IN REGARDS TO B/P MEDS, STATES SHE HAS TWO DIFFERENT KINDS AND NEEDS TO KNOW IF SHE NEEDS TO TAKE BOTH OR JUST ONE. ALSO PLEASE CALL IN TOUJEO TO [facility] ON [address]. IF PT IS NOT HOME PLEASE LEAVE MESSAGE WITH HUSBAND.

## 2016-09-23 IMAGING — MR MR LUMBAR SPINE WO/W CM
8 series · 48 of 48 positions shown · IV contrast (multihance)
Comparison: CT abdomen and pelvis 02/14/2014. MRI lumbar spine
12/17/2013. Intraoperative spine film 02/04/2014

CLINICAL DATA: Low back, RIGHT hip and leg pain of months duration.
Status Post recent surgery Giorgi. Subsequent encounter.

EXAM:
MRI LUMBAR SPINE WITHOUT AND WITH CONTRAST
TECHNIQUE: Multiplanar and multiecho pulse sequences of the lumbar spine were
obtained without and with intravenous contrast.
CONTRAST:  19mL MULTIHANCE GADOBENATE DIMEGLUMINE 529 MG/ML IV SOLN
BUN and creatinine were obtained on site at [HOSPITAL] at
[HOSPITAL].
Results:  BUN 25 mg/dL,  Creatinine 0.7 mg/dL.

[Series 3: T2 · sagittal · 4.0mm · 0.88mm/px · 3 of 13 slices shown (1 of 2)]
[im 1/13]
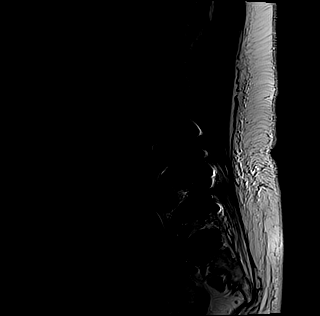
[im 7/13]
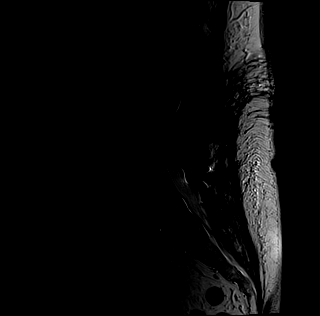
[im 13/13]
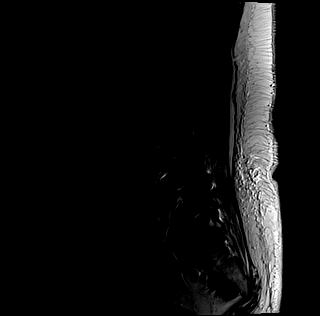

[Series 4: tirm sag · sagittal · 4.0mm · 0.55mm/px · 3 of 13 slices shown]
[im 1/13]
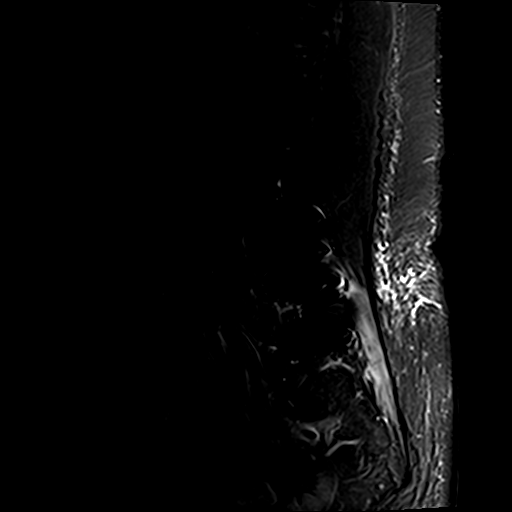
[im 7/13]
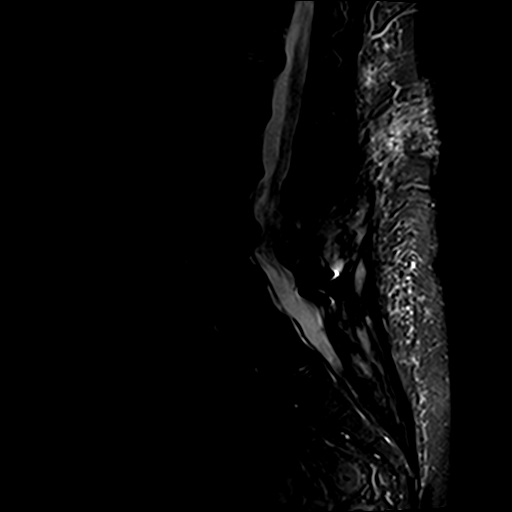
[im 13/13]
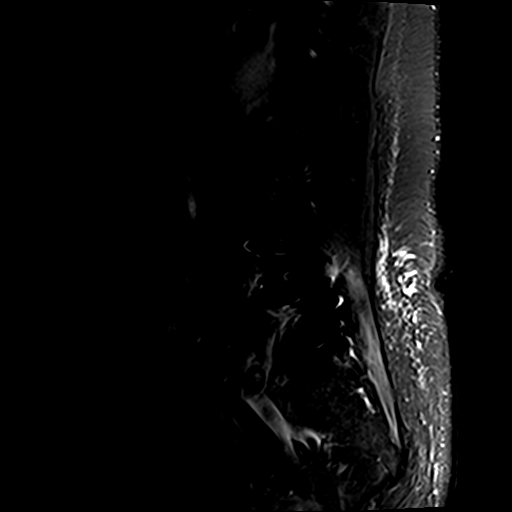

[Series 5: T1 · sagittal · 4.0mm · 0.88mm/px · 4 of 13 slices shown (1 of 2)]
[im 1/13]
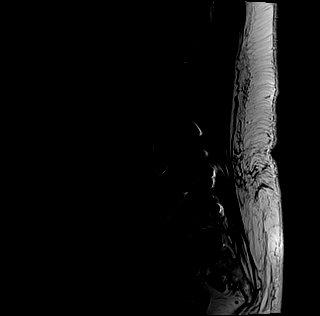
[im 5/13]
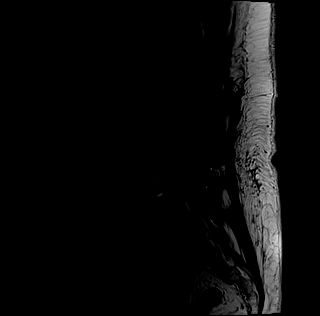
[im 9/13]
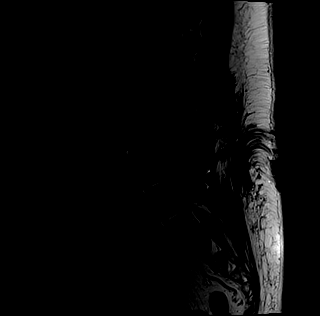
[im 13/13]
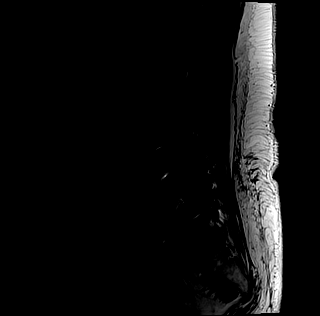

[Series 6: T1 · axial · 4.0mm · 0.78mm/px · z∈[-26,+162]mm · 10 of 34 slices shown (2 of 2)]
[im 1/34]
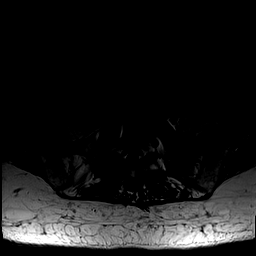
[im 4/34]
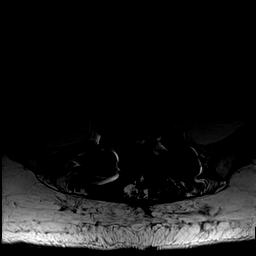
[im 8/34]
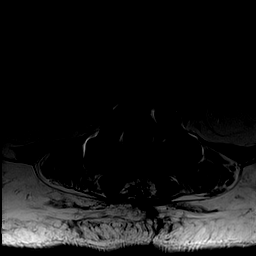
[im 12/34]
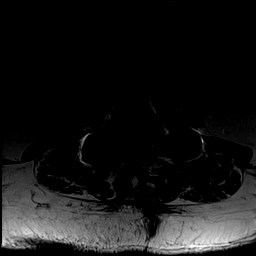
[im 15/34]
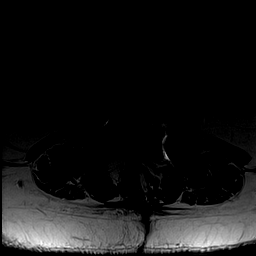
[im 19/34]
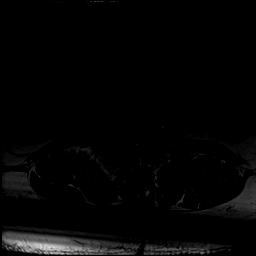
[im 23/34]
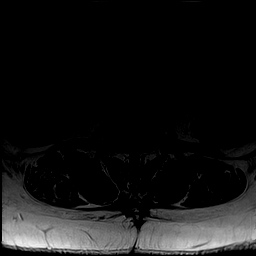
[im 26/34]
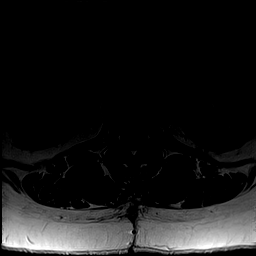
[im 30/34]
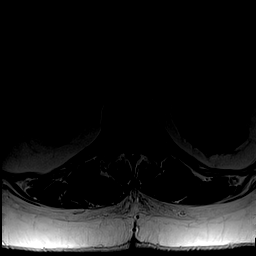
[im 34/34]
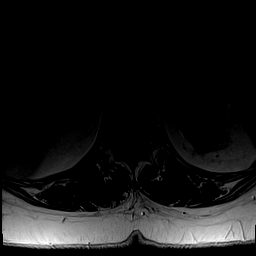

[Series 7: T2 · axial · 4.0mm · 0.78mm/px · z∈[-26,+162]mm · 10 of 34 slices shown (2 of 2)]
[im 1/34]
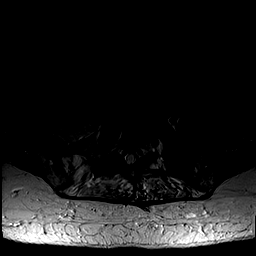
[im 4/34]
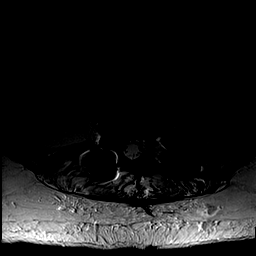
[im 8/34]
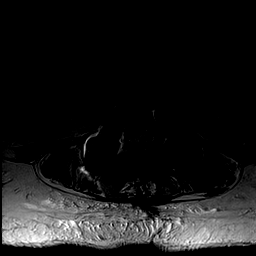
[im 12/34]
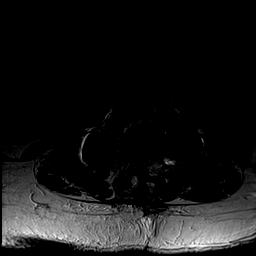
[im 15/34]
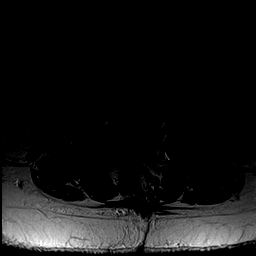
[im 19/34]
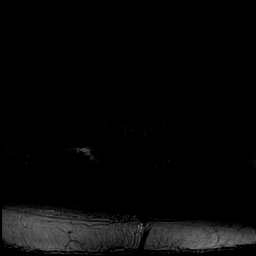
[im 23/34]
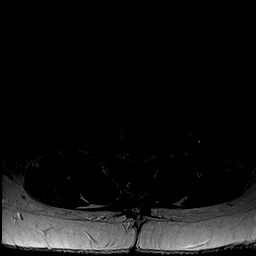
[im 26/34]
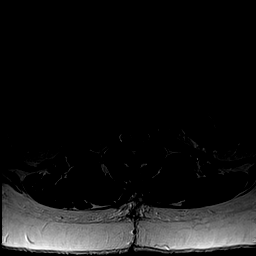
[im 30/34]
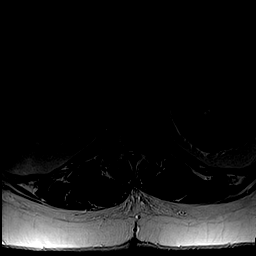
[im 34/34]
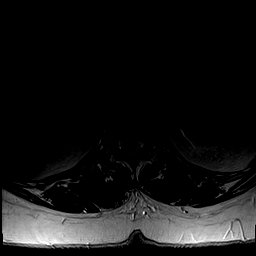

[Series 8: T1 fat-sat · sagittal · 4.0mm · 0.88mm/px · 4 of 13 slices shown]
[im 1/13]
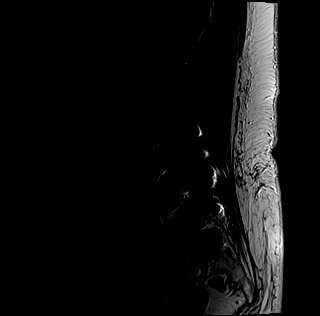
[im 5/13]
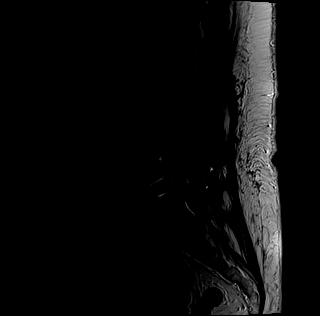
[im 9/13]
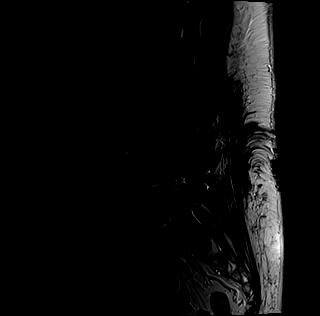
[im 13/13]
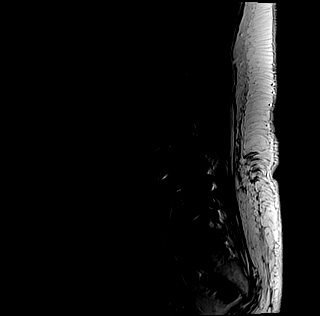

[Series 9: T1 fat-sat post-contrast · sagittal · 4.0mm · 0.88mm/px · 4 of 13 slices shown]
[im 1/13]
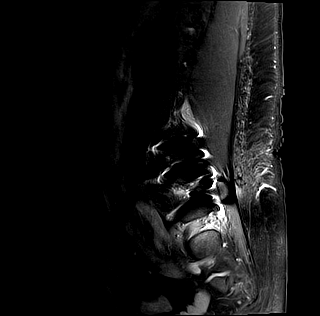
[im 5/13]
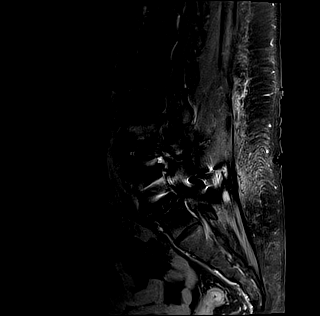
[im 9/13]
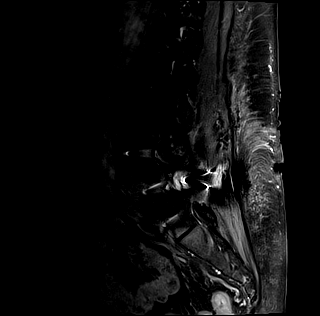
[im 13/13]
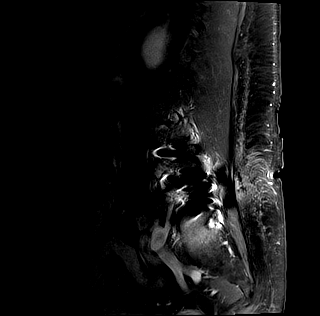

[Series 10: T1 post-contrast · axial · 4.0mm · 0.78mm/px · z∈[-26,+162]mm · 10 of 34 slices shown]
[im 1/34]
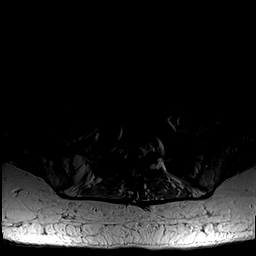
[im 4/34]
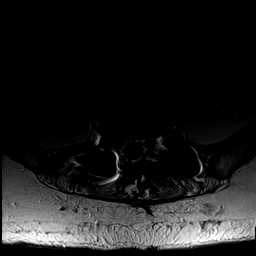
[im 8/34]
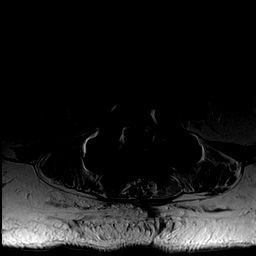
[im 12/34]
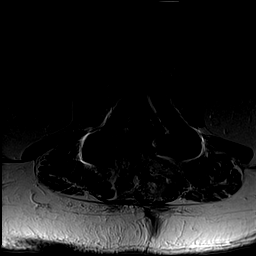
[im 15/34]
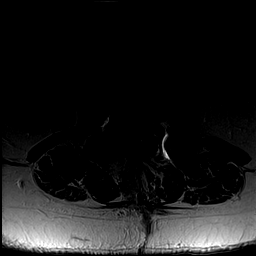
[im 19/34]
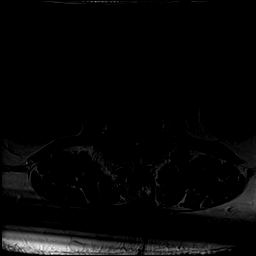
[im 23/34]
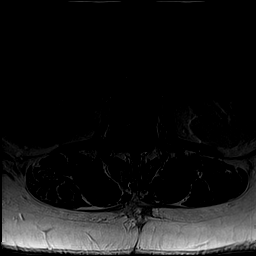
[im 26/34]
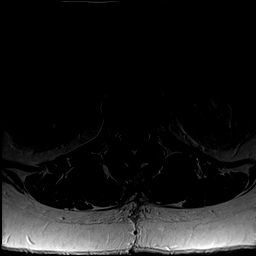
[im 30/34]
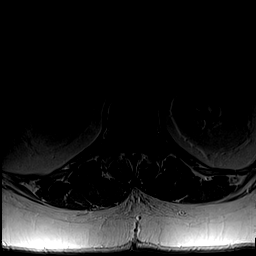
[im 34/34]
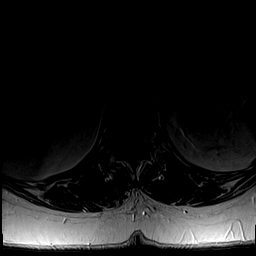

[48 of 48 positions shown; findings below may reference images not displayed]

FINDINGS: COMMENT ON TRANSITIONAL ANATOMY: THE OPERATIVE NOTE FROM 02/04/2014
REFERS TO THE RECENT SURGICAL LEVEL AS L2-L3. ON ALL PREVIOUS MR
STUDIES, THIS LEVEL IS LABELED AS L3-L4. THE NUMBERING SCHEME USED
ON PREVIOUS MR LORELAIE BE CONTINUED ON TODAY'S EXAMINATION. CAREFUL
ADHERENCE TO THIS NUMBERING SCHEME IS RECOMMENDED PRIOR TO SURGICAL
INTERVENTION.

Since the previous MRI, the patient has undergone laminectomy and
discectomy at L3-4. Today's examination demonstrates recurrent disc
extrusion at L3-4 central and to the RIGHT. This is accompanied by 4
mm of retrolisthesis and focal exaggerated lordosis. Despite the
central laminectomy, there is severe spinal stenosis at this level
exacerbated by posterior element hypertrophy, including a 6 x 10 mm
RIGHT-sided synovial cyst which is new/ increased from priors. RIGHT
greater than LEFT L4 nerve root impingement is noted. Suspected
foraminal narrowing on the RIGHT, although obscured by
susceptibility from the adjacent pedicle screw, with probable RIGHT
L3 nerve root impingement. Mild peridiscal enhancement without
features concerning for postoperative infection. No concerning
paravertebral collections.

Stable and unchanged findings of L4 through S1 posterior and
interbody fusion. No residual impingement at these levels. Minor
disc pathology at L2-L3 is stable.
IMPRESSION: Transitional anatomy.  See discussion above.

Adjacent segment disease, with recurrent disc extrusion at L3-4
central and to the RIGHT, even larger than the original disc
herniation identified on the previous MR. This, combined with
posterior element hypertrophy and 4 mm retrolisthesis, results in
significant spinal stenosis and RIGHT greater than LEFT L4 and L3
nerve root impingement.

## 2016-09-23 MED ORDER — INSULIN GLARGINE 300 UNIT/ML ~~LOC~~ SOPN: 140.0000 [IU] | PEN_INJECTOR | SUBCUTANEOUS | 2 refills | Status: DC

## 2016-09-23 NOTE — Telephone Encounter (Signed)
LMTRC and Toujeo sent to [facility]

## 2016-09-28 ENCOUNTER — Encounter (HOSPITAL_BASED_OUTPATIENT_CLINIC_OR_DEPARTMENT_OTHER): Payer: Self-pay

## 2016-09-28 ENCOUNTER — Emergency Department (HOSPITAL_BASED_OUTPATIENT_CLINIC_OR_DEPARTMENT_OTHER)
Admission: EM | Admit: 2016-09-28 | Discharge: 2016-09-29 | Disposition: A | Discharge status: Home / Self Care | Payer: Medicare HMO | Attending: Emergency Medicine | Admitting: Emergency Medicine

## 2016-09-28 DIAGNOSIS — E119 Type 2 diabetes mellitus without complications: Secondary | ICD-10-CM | POA: Diagnosis not present

## 2016-09-28 DIAGNOSIS — I10 Essential (primary) hypertension: Secondary | ICD-10-CM | POA: Insufficient documentation

## 2016-09-28 DIAGNOSIS — G8929 Other chronic pain: Secondary | ICD-10-CM | POA: Insufficient documentation

## 2016-09-28 DIAGNOSIS — M542 Cervicalgia: Secondary | ICD-10-CM | POA: Diagnosis not present

## 2016-09-28 DIAGNOSIS — Z7982 Long term (current) use of aspirin: Secondary | ICD-10-CM | POA: Diagnosis not present

## 2016-09-28 DIAGNOSIS — Z7984 Long term (current) use of oral hypoglycemic drugs: Secondary | ICD-10-CM | POA: Insufficient documentation

## 2016-09-28 DIAGNOSIS — Z79899 Other long term (current) drug therapy: Secondary | ICD-10-CM | POA: Diagnosis not present

## 2016-09-28 DIAGNOSIS — R51 Headache: Secondary | ICD-10-CM | POA: Diagnosis present

## 2016-09-28 MED ORDER — BUPIVACAINE-EPINEPHRINE (PF) 0.5% -1:200000 IJ SOLN
1.8000 mL | Freq: Once | INTRAMUSCULAR | Status: AC
  Administered 2016-09-28: 1.8 mL
  Filled 2016-09-28: qty 1.8

## 2016-09-28 NOTE — ED Provider Notes (Addendum)
[facility] Provider Note: Desiree Spurling, MD, FACEP  CSN: [id number] MRN: [id number] ARRIVAL: 09/28/16 at 2104 ROOM: [place]  Headache   HISTORY OF PRESENT ILLNESS  [name] is a 66 y.o. female with a 2 to three-week history of pain in her neck that radiates up to her head. She states it started with a sensation of muscle spasms in the side of her neck on the left. It has subsequently spread to involve most of her head and posterior neck. Pain is worse with palpation or movement of her neck. She also has a sensation that her ears are stuffed up. She has chronic tinnitus of the left ear which has not acutely changed. She has also noticed edema of her lower extremities recently. She also complains of feeling off balance. She rates her pain as a 10 out of 10.  Consultation with the [facility] state controlled substances database reveals the patient has received two prescriptions for tramadol and one prescription for hydrocodone in the past year.   Past Medical History:  Diagnosis Date  . Anginal pain ([place]) 03/19/2012   saw Dr. Cathie Olden .. she thinks its more related to stomach issues  . Arthritis    "all over; mostly in my back" (03/20/2012)  . Breast cancer ([place])    "right" (03/20/2012)  . Cardiomyopathy   . Chest pain 06/2005   Hospitalized, dystolic dysfunction  . Chronic lower back pain    "have 2 herniated discs; going to have to have a fusion" (03/20/2012)  . Family history of anesthesia complication    "father has nausea and vomiting"  . GERD (gastroesophageal reflux disease)   . H/O hiatal hernia   . History of kidney stones   . History of right mastectomy   . Hypertension    "patient states never had HTN, takes Hyzaar for heart  . Hypothyroidism   . Irritable bowel syndrome   . Migraines    "it's been a long time since I've had one" (03/20/2012)  . Osteoarthritis   . Peripheral neuropathy   . PONV (postoperative nausea and  vomiting)   . Sciatica   . Type II diabetes mellitus ([place])     Past Surgical History:  Procedure Laterality Date  . ABDOMINAL HYSTERECTOMY  1993  . adenosin cardiolite  07/2005   (+) wall motion abnormality  . AXILLARY LYMPH NODE DISSECTION  2003   squamous cell cancer  . BACK SURGERY    . CARDIAC CATHETERIZATION  ? 2005 / 2007  . CT abdomen and pelvis  02/2010   Same  . ESOPHAGOGASTRODUODENOSCOPY  2005   GERD  . KNEE ARTHROSCOPY  1990's   "right" (03/20/2012)  . LUMBAR FUSION N/A 08/22/2014   Procedure: Right L2-3 and right L1-2 transforaminal lumbar interbody fusion with pedicle screws, rods, sleeves, cages, local bone graft, Vivigen, cancellous chips;  Surgeon: Jessy Oto, MD;  Location: [facility];  Service: Orthopedics;  Laterality: N/A;  . LUMBAR LAMINECTOMY/DECOMPRESSION MICRODISCECTOMY N/A 02/04/2014   Procedure: RIGHT L2-3 MICRODISCECTOMY ;  Surgeon: Jessy Oto, MD;  Location: [facility];  Service: Orthopedics;  Laterality: N/A;  . MASTECTOMY WITH AXILLARY LYMPH NODE DISSECTION  12/2003   "right" (03/20/2012)  . POSTERIOR CERVICAL FUSION/FORAMINOTOMY  2001  . SHOULDER ARTHROSCOPY W/ ROTATOR CUFF REPAIR  2003   Left  . THYROIDECTOMY  1977   Right  . TOTAL KNEE ARTHROPLASTY  2000   Right  . [place] of abdomen  07/2005  Fatty liver / kidney stones    Family History  Problem Relation Age of Onset  . Hypertension Mother   . Diabetes Father   . Hypertension Father   . Hypertension Sister   . Hypertension Brother   . Hypertension Brother   . Diabetes Brother   . Diabetes Other        Family Hx. of Diabetes  . Hypertension Other        Family History of HTN    Social History  Substance Use Topics  . Smoking status: Never Smoker  . Smokeless tobacco: Never Used  . Alcohol use No    Prior to Admission medications   Medication Sig Start Date End Date Taking? Authorizing Provider  aspirin 81 MG tablet Take 81 mg by mouth daily.    [provider]  Blood  Glucose Calibration (TRUE METRIX LEVEL 2) Normal SOLN Use as directed. 08/20/15   Susy Frizzle, MD  Blood Glucose Monitoring Suppl (BLOOD GLUCOSE SYSTEM PAK) KIT Use as directed to monitor FSBS 3x daily. Dx: E11.9. Please dispense [employer] True Metrix. 08/20/15   Susy Frizzle, MD  Cholecalciferol (VITAMIN D3) 5000 UNITS CAPS Take 5,000 Units by mouth 2 (two) times daily.    [provider]  fluticasone (FLONASE) 50 MCG/ACT nasal spray Place 2 sprays into both nostrils daily. 08/20/15   Susy Frizzle, MD  gabapentin (NEURONTIN) 300 MG capsule Take 1 capsule (300 mg total) by mouth 3 (three) times daily. 09/21/16   Susy Frizzle, MD  Glucose Blood (BLOOD GLUCOSE TEST STRIPS) STRP Use as directed to monitor FSBS 3x daily. Dx: E11.9. Please dispense [employer] True Metrix. 08/20/15   Susy Frizzle, MD  Insulin Glargine (TOUJEO SOLOSTAR) 300 UNIT/ML SOPN Inject 140 Units into the skin every morning. 09/23/16   Susy Frizzle, MD  Insulin Pen Needle (PEN NEEDLES 5/16") 30G X 8 MM MISC Use to inject insulin 5x daily. 12/31/13   Susy Frizzle, MD  Lancet Devices MISC Use as directed to monitor FSBS 3x daily. Dx: E11.9. Please dispense TruePlus Ultra Thin. 08/20/15   Susy Frizzle, MD  Lancets MISC Use as directed to monitor FSBS 3x daily. Dx: E11.9. Please dispense TruePlus Ultra Thin. 08/20/15   Susy Frizzle, MD  levothyroxine (SYNTHROID, LEVOTHROID) 100 MCG tablet Take 1 tablet (100 mcg total) by mouth daily. 09/21/16   Susy Frizzle, MD  losartan-hydrochlorothiazide (HYZAAR) 50-12.5 MG tablet Take 1 tablet by mouth daily. 09/21/16   Susy Frizzle, MD  metFORMIN (GLUCOPHAGE) 850 MG tablet Take 1 tablet (850 mg total) by mouth 2 (two) times daily with a meal. 09/21/16   Pickard, Cammie Mcgee, MD  Misc Natural Products (LAXATIVE FORMULA) TABS Take 1 tablet by mouth as needed (for BM).    [provider]  ondansetron (ZOFRAN ODT) 4 MG disintegrating tablet Take 1 tablet  (4 mg total) by mouth every 8 (eight) hours as needed for nausea or vomiting. Patient not taking: Reported on 09/16/2016 08/30/15   Ward, Delice Bison, DO  oxyCODONE-acetaminophen (PERCOCET/ROXICET) 5-325 MG tablet 1 tablet every 6 (six) hours as needed for moderate pain.  08/30/15   [provider]  pantoprazole (PROTONIX) 40 MG tablet Take 1 tablet (40 mg total) by mouth daily. Patient not taking: Reported on 09/16/2016 09/03/15   Susy Frizzle, MD  traMADol (ULTRAM) 50 MG tablet Take 1 tablet (50 mg total) by mouth every 6 (six) hours as needed for moderate pain. Patient  not taking: Reported on 09/16/2016 02/05/16   Jessy Oto, MD    Allergies Dilaudid [hydromorphone hcl]; Lipitor [atorvastatin]; Adhesive [tape]; Ampicillin; and Penicillins   REVIEW OF SYSTEMS  Negative except as noted here or in the History of Present Illness.   PHYSICAL EXAMINATION  Initial Vital Signs Blood pressure (!) 169/75, pulse 64, temperature 98.2 F (36.8 C), temperature source Oral, resp. rate 18, height 5' 5" (1.651 m), weight 88.5 kg (195 lb), SpO2 99 %.  Examination General: Well-developed, well-nourished female in no acute distress; appearance consistent with age of record HENT: normocephalic; atraumatic; tenderness of the scalp bilaterally; TMs normal Eyes: pupils equal, round and reactive to light; extraocular muscles intact Neck: supple; soft tissue tenderness of the posterior neck bilaterally with palpable muscle tightness Heart: regular rate and rhythm Lungs: clear to auscultation bilaterally Abdomen: soft; nondistended; nontenderbowel sounds present Extremities: No deformity; full range of motion; pulses normal; 2+ pitting edema of the lower legs Neurologic: Awake, alert and oriented; motor function intact in all extremities and symmetric; no facial droop Skin: Warm and dry Psychiatric: Normal mood and affect   RESULTS  Summary of this visit's results, reviewed by myself:   EKG  Interpretation  Date/Time:    Ventricular Rate:    PR Interval:    QRS Duration:   QT Interval:    QTC Calculation:   R Axis:     Text Interpretation:        Laboratory Studies: No results found for this or any previous visit (from the past 24 hour(s)). Imaging Studies: No results found.  ED COURSE  Nursing notes and initial vitals signs, including pulse oximetry, reviewed.  Vitals:   09/28/16 2113 09/28/16 2344  BP: (!) 169/75 (!) 157/60  Pulse: 64 64  Resp: 18 15  Temp: 98.2 F (36.8 C)   TempSrc: Oral   SpO2: 99% 97%  Weight: 88.5 kg (195 lb)   Height: 5' 5" (1.651 m)    12:21 AM  No relief with paracervical blocks. We'll treat for what I suspect is musculoskeletal pain. She does have a history of degenerative disease of the neck and is status post surgery.  PROCEDURES   PARACERVICAL BLOCK The patient's neck was prepped with chlorhexidine. 1.8 milliliters of 0.5% proparacaine with epinephrine were injected into the soft tissue of the neck bilaterally. The injection sites were approximately 1.5 centimeter above and lateral to the vertebra prominens. The patient tolerated this well and there were no immediate complications.  ED DIAGNOSES     ICD-10-CM   1. Bilateral posterior neck pain M54.2        Leeya Rusconi, MD 09/29/16 0024    Shanon Rosser, MD 09/29/16 [phone]

## 2016-09-28 NOTE — ED Notes (Signed)
MD is with pt. Will attempt vs shortly.

## 2016-09-28 NOTE — ED Triage Notes (Signed)
C/o HA, bilat ear fullness, neck pain, "off balance" x 2 weeks-NAD-presents to triage in w/c

## 2016-09-29 MED ORDER — HYDROCODONE-ACETAMINOPHEN 5-325 MG PO TABS
1.0000 | ORAL_TABLET | Freq: Once | ORAL | Status: AC
  Administered 2016-09-29: 1 via ORAL
  Filled 2016-09-29: qty 1

## 2016-09-29 MED ORDER — HYDROCODONE-ACETAMINOPHEN 5-325 MG PO TABS: 1.0000 | ORAL_TABLET | Freq: Four times a day (QID) | ORAL | 0 refills | Status: DC | PRN

## 2016-09-29 MED ORDER — CYCLOBENZAPRINE HCL 10 MG PO TABS
10.0000 mg | ORAL_TABLET | Freq: Once | ORAL | Status: AC
  Administered 2016-09-29: 10 mg via ORAL
  Filled 2016-09-29: qty 1

## 2016-09-29 MED ORDER — CYCLOBENZAPRINE HCL 10 MG PO TABS: 10.0000 mg | ORAL_TABLET | Freq: Three times a day (TID) | ORAL | 0 refills | Status: DC | PRN

## 2016-09-30 ENCOUNTER — Ambulatory Visit (INDEPENDENT_AMBULATORY_CARE_PROVIDER_SITE_OTHER): Payer: Medicare HMO | Admitting: Family Medicine

## 2016-09-30 ENCOUNTER — Encounter: Payer: Self-pay | Admitting: Family Medicine

## 2016-09-30 VITALS — BP 130/68 | HR 68 | Temp 98.2°F | Resp 18 | Ht 65.0 in | Wt 206.0 lb

## 2016-09-30 DIAGNOSIS — R51 Headache: Secondary | ICD-10-CM | POA: Diagnosis not present

## 2016-09-30 DIAGNOSIS — M7989 Other specified soft tissue disorders: Secondary | ICD-10-CM

## 2016-09-30 DIAGNOSIS — R519 Headache, unspecified: Secondary | ICD-10-CM

## 2016-09-30 MED ORDER — DICLOFENAC SODIUM 75 MG PO TBEC: 75.0000 mg | DELAYED_RELEASE_TABLET | Freq: Two times a day (BID) | ORAL | 0 refills | Status: DC

## 2016-09-30 MED ORDER — FUROSEMIDE 40 MG PO TABS: 40.0000 mg | ORAL_TABLET | Freq: Every day | ORAL | 3 refills | Status: DC

## 2016-09-30 NOTE — Telephone Encounter (Signed)
Pt seen in office today.

## 2016-09-30 NOTE — Progress Notes (Signed)
Subjective:    Patient ID: [name], female    DOB: Nov 06, 1950, 66 y.o.   MRN: [id number]  HPI Patient has a history of medical noncompliance. Was off her blood pressure medication which had a diuretic in it. Was seen in the emergency room with a occipital headache and neck stiffness a few days ago. Patient had no imaging or lab work at that time. She was treated for musculoskeletal headache. Her headache is some better but she continues to have a pressure-like sensation in her head. She rates it on a scale of 10 is a 4. She also reports decreased hearing in her left ear. After we rinsed out the cerumen impaction that I observed on exam, her hearing improved. She also presents with significant swelling in both legs. She has +1 pitting edema on the dorsum of her foot and on her lower legs up to her knees. Her weight is up 10 pounds. As I said earlier she is off hydrochlorothiazide. She denies any chest pain shortness of breath or dyspnea on exertion Past Medical History:  Diagnosis Date  . Anginal pain ([place]) 03/19/2012   saw Dr. Cathie Olden .. she thinks its more related to stomach issues  . Arthritis    "all over; mostly in my back" (03/20/2012)  . Breast cancer ([place])    "right" (03/20/2012)  . Cardiomyopathy   . Chest pain 06/2005   Hospitalized, dystolic dysfunction  . Chronic lower back pain    "have 2 herniated discs; going to have to have a fusion" (03/20/2012)  . Family history of anesthesia complication    "father has nausea and vomiting"  . GERD (gastroesophageal reflux disease)   . H/O hiatal hernia   . History of kidney stones   . History of right mastectomy   . Hypertension    "patient states never had HTN, takes Hyzaar for heart  . Hypothyroidism   . Irritable bowel syndrome   . Migraines    "it's been a long time since I've had one" (03/20/2012)  . Osteoarthritis   . Peripheral neuropathy   . PONV (postoperative nausea and vomiting)   . Sciatica   . Type II  diabetes mellitus ([place])    Past Surgical History:  Procedure Laterality Date  . ABDOMINAL HYSTERECTOMY  1993  . adenosin cardiolite  07/2005   (+) wall motion abnormality  . AXILLARY LYMPH NODE DISSECTION  2003   squamous cell cancer  . BACK SURGERY    . CARDIAC CATHETERIZATION  ? 2005 / 2007  . CT abdomen and pelvis  02/2010   Same  . ESOPHAGOGASTRODUODENOSCOPY  2005   GERD  . KNEE ARTHROSCOPY  1990's   "right" (03/20/2012)  . LUMBAR FUSION N/A 08/22/2014   Procedure: Right L2-3 and right L1-2 transforaminal lumbar interbody fusion with pedicle screws, rods, sleeves, cages, local bone graft, Vivigen, cancellous chips;  Surgeon: Jessy Oto, MD;  Location: [facility];  Service: Orthopedics;  Laterality: N/A;  . LUMBAR LAMINECTOMY/DECOMPRESSION MICRODISCECTOMY N/A 02/04/2014   Procedure: RIGHT L2-3 MICRODISCECTOMY ;  Surgeon: Jessy Oto, MD;  Location: [facility];  Service: Orthopedics;  Laterality: N/A;  . MASTECTOMY WITH AXILLARY LYMPH NODE DISSECTION  12/2003   "right" (03/20/2012)  . POSTERIOR CERVICAL FUSION/FORAMINOTOMY  2001  . SHOULDER ARTHROSCOPY W/ ROTATOR CUFF REPAIR  2003   Left  . THYROIDECTOMY  1977   Right  . TOTAL KNEE ARTHROPLASTY  2000   Right  . [place] of abdomen  07/2005  Fatty liver / kidney stones   Current Outpatient Prescriptions on File Prior to Visit  Medication Sig Dispense Refill  . aspirin 81 MG tablet Take 81 mg by mouth daily.    . Blood Glucose Calibration (TRUE METRIX LEVEL 2) Normal SOLN Use as directed. 3 each 3  . Blood Glucose Monitoring Suppl (BLOOD GLUCOSE SYSTEM PAK) KIT Use as directed to monitor FSBS 3x daily. Dx: E11.9. Please dispense [employer] True Metrix. 1 each 1  . Cholecalciferol (VITAMIN D3) 5000 UNITS CAPS Take 5,000 Units by mouth 2 (two) times daily.    . cyclobenzaprine (FLEXERIL) 10 MG tablet Take 1 tablet (10 mg total) by mouth 3 (three) times daily as needed for muscle spasms. 20 tablet 0  . fluticasone (FLONASE) 50 MCG/ACT nasal  spray Place 2 sprays into both nostrils daily. 48 g 3  . gabapentin (NEURONTIN) 300 MG capsule Take 1 capsule (300 mg total) by mouth 3 (three) times daily. 90 capsule 3  . Glucose Blood (BLOOD GLUCOSE TEST STRIPS) STRP Use as directed to monitor FSBS 3x daily. Dx: E11.9. Please dispense [employer] True Metrix. 300 each 3  . HYDROcodone-acetaminophen (NORCO) 5-325 MG tablet Take 1 tablet by mouth every 6 (six) hours as needed (for pain). 20 tablet 0  . Insulin Glargine (TOUJEO SOLOSTAR) 300 UNIT/ML SOPN Inject 140 Units into the skin every morning. 9 pen 2  . Insulin Pen Needle (PEN NEEDLES 5/16") 30G X 8 MM MISC Use to inject insulin 5x daily. 200 each 11  . Lancet Devices MISC Use as directed to monitor FSBS 3x daily. Dx: E11.9. Please dispense TruePlus Ultra Thin. 1 each 1  . Lancets MISC Use as directed to monitor FSBS 3x daily. Dx: E11.9. Please dispense TruePlus Ultra Thin. 300 each 3  . levothyroxine (SYNTHROID, LEVOTHROID) 100 MCG tablet Take 1 tablet (100 mcg total) by mouth daily. 30 tablet 5  . losartan-hydrochlorothiazide (HYZAAR) 50-12.5 MG tablet Take 1 tablet by mouth daily. 30 tablet 1  . metFORMIN (GLUCOPHAGE) 850 MG tablet Take 1 tablet (850 mg total) by mouth 2 (two) times daily with a meal. 180 tablet 3  . Misc Natural Products (LAXATIVE FORMULA) TABS Take 1 tablet by mouth as needed (for BM).     No current facility-administered medications on file prior to visit.    Allergies  Allergen Reactions  . Dilaudid [Hydromorphone Hcl] Nausea And Vomiting  . Lipitor [Atorvastatin] Other (See Comments)    Body & Muscle Aches  . Adhesive [Tape] Rash  . Ampicillin Rash    REACTION: Rash and welts on her hands when she took it in the hospital  . Penicillins Rash   Social History   Social History  . Marital status: Married    Spouse name: N/A  . Number of children: 1  . Years of education: N/A   Occupational History  . Retired since 2003    Social History Main Topics  .  Smoking status: Never Smoker  . Smokeless tobacco: Never Used  . Alcohol use No  . Drug use: No  . Sexual activity: Not on file   Other Topics Concern  . Not on file   Social History Narrative  . No narrative on file      Review of Systems  All other systems reviewed and are negative.      Objective:   Physical Exam  Constitutional: She is oriented to person, place, and time.  Neck: Neck supple. No JVD present. No thyromegaly present.  Cardiovascular:  Normal rate, regular rhythm and normal heart sounds.   No murmur heard. Pulmonary/Chest: Effort normal and breath sounds normal. No respiratory distress. She has no wheezes. She has no rales.  Musculoskeletal: She exhibits edema.  Neurological: She is alert and oriented to person, place, and time. She has normal reflexes. She displays normal reflexes. No cranial nerve deficit. She exhibits normal muscle tone. Coordination normal.  Vitals reviewed.         Assessment & Plan:  Leg swelling - Plan: COMPLETE METABOLIC PANEL WITH GFR, Brain natriuretic peptide  Acute nonintractable headache, unspecified headache type  I'll check the patient's renal function and liver function tests with a CMP. I will evaluate for heart failure with a BNP. Patient is not taking her hydrochlorothiazide/losartan. I asked her to continue to hold this because I will start her on Lasix 40 mg a day.  Recheck her swelling as well as her renal function and potassium next week.  Begin diclofenac 75 mg by mouth twice a day for headache and reassess next week

## 2016-10-03 ENCOUNTER — Other Ambulatory Visit: Payer: Self-pay

## 2016-10-03 ENCOUNTER — Other Ambulatory Visit: Payer: Medicare HMO

## 2016-10-03 DIAGNOSIS — M7989 Other specified soft tissue disorders: Secondary | ICD-10-CM | POA: Diagnosis not present

## 2016-10-04 LAB — BRAIN NATRIURETIC PEPTIDE: Brain Natriuretic Peptide: 43.9 pg/mL (ref <100)

## 2016-10-04 LAB — COMPLETE METABOLIC PANEL WITH GFR
ALBUMIN: 3.6 g/dL (ref 3.6–5.1)
ALK PHOS: 52 U/L (ref 33–130)
ALT: 15 U/L (ref 6–29)
AST: 18 U/L (ref 10–35)
BILIRUBIN TOTAL: 0.4 mg/dL (ref 0.2–1.2)
BUN: 17 mg/dL (ref 7–25)
CO2: 24 mmol/L (ref 20–31)
Calcium: 10.4 mg/dL (ref 8.6–10.4)
Chloride: 104 mmol/L (ref 98–110)
Creat: 0.79 mg/dL (ref 0.50–0.99)
GFR, EST AFRICAN AMERICAN: 89 mL/min (ref >=60)
GFR, EST NON AFRICAN AMERICAN: 77 mL/min (ref >=60)
Glucose, Bld: 243 mg/dL — ABNORMAL HIGH (ref 70–99)
POTASSIUM: 4.6 mmol/L (ref 3.5–5.3)
SODIUM: 137 mmol/L (ref 135–146)
TOTAL PROTEIN: 6.7 g/dL (ref 6.1–8.1)

## 2016-10-07 ENCOUNTER — Encounter: Payer: Self-pay | Admitting: Family Medicine

## 2016-10-07 ENCOUNTER — Ambulatory Visit (INDEPENDENT_AMBULATORY_CARE_PROVIDER_SITE_OTHER): Payer: Medicare HMO | Admitting: Family Medicine

## 2016-10-07 VITALS — BP 128/70 | HR 70 | Temp 98.4°F | Resp 16 | Ht 65.0 in | Wt 204.0 lb

## 2016-10-07 DIAGNOSIS — E1142 Type 2 diabetes mellitus with diabetic polyneuropathy: Secondary | ICD-10-CM

## 2016-10-07 DIAGNOSIS — Z794 Long term (current) use of insulin: Secondary | ICD-10-CM | POA: Diagnosis not present

## 2016-10-07 DIAGNOSIS — R609 Edema, unspecified: Secondary | ICD-10-CM

## 2016-10-07 NOTE — Patient Instructions (Addendum)
Go  55 units twice a day for [place] Increase the lasix to 40mg  twice a day for 5 days, take with potassium  F/U Next Thursday Friday with Dr. Dennard Schaumann

## 2016-10-07 NOTE — Progress Notes (Signed)
   Subjective:    Patient ID: [name], female    DOB: 1951-01-18, 66 y.o.   MRN: [id number]  Patient presents for Follow-up (1 week- edema)   Seen last week, For peripheral edema. She was started on Lasix she had not been taking her losartan HCTZ she could not give a specific reason why she states she wasn't taking it. No difficulty breathing and no chest pain. She's here for recheck on her lower extremity edema.  Her scale at home her weight has gone down 2lbs at home  DM- followed by Dr. Loanne Drilling, last A1C 11.6% in our records, advised to takee 7units plus sliding scale Novolog with meals and told to take 140mg  of Tujeo, sates CBG has been down to 120's, but forgot to take her insulin last night  , this morning 187 She has actually only been taking 50units twice a day     Had Stress test last year EF 57%,    Review Of Systems:  GEN- denies fatigue, fever, weight loss,weakness, recent illness HEENT- denies eye drainage, change in vision, nasal discharge, CVS- denies chest pain, palpitations RESP- denies SOB, cough, wheeze ABD- denies N/V, change in stools, abd pain GU- denies dysuria, hematuria, dribbling, incontinence MSK- denies joint pain, muscle aches, injury Neuro- denies headache, dizziness, syncope, seizure activity       Objective:    BP 128/70   Pulse 70   Temp 98.4 F (36.9 C) (Oral)   Resp 16   Ht 5\' 5"  (1.651 m)   Wt 204 lb (92.5 kg)   SpO2 98%   BMI 33.95 kg/m  GEN- NAD, alert and oriented x3 HEENT- PERRL, EOMI, non injected sclera, pink conjunctiva, MMM, oropharynx clear Neck- Supple, no JVD  CVS- RRR, no murmur RESP-CTAB ABD-NABS,soft,NT,ND EXT- 1+ edema Pulses- Radial, DP- 2+        Assessment & Plan:      Problem List Items Addressed This Visit    Diabetes with neurologic complications ([place])    Diabetes is uncontrolled but she is noncompliant with her regimen. Discussed with her today since she is only been taking 50 units twice a day  and her blood sugar still quite elevated to increase the 55 units twice a day. She's to follow-up with her endocrinologist as well as their recommendations for further control. She will continue her sliding scale insulin as well.       Other Visit Diagnoses    Peripheral edema    -  Primary   mild improvement, incrase lasix to 40mg  BID with potassium, recheck in 1 week with PCP, will need repeat BMET, with uncontrolled DM. Discussed sodium intake/diet Plan a role in her fluid retention as well.       Note: This dictation was prepared with Dragon dictation along with smaller phrase technology. Any transcriptional errors that result from this process are unintentional.

## 2016-10-10 ENCOUNTER — Encounter: Payer: Self-pay | Admitting: Family Medicine

## 2016-10-10 NOTE — Assessment & Plan Note (Signed)
Diabetes is uncontrolled but she is noncompliant with her regimen. Discussed with her today since she is only been taking 50 units twice a day and her blood sugar still quite elevated to increase the 55 units twice a day. She's to follow-up with her endocrinologist as well as their recommendations for further control. She will continue her sliding scale insulin as well.

## 2016-10-14 ENCOUNTER — Encounter: Payer: Self-pay | Admitting: Family Medicine

## 2016-10-14 ENCOUNTER — Ambulatory Visit (INDEPENDENT_AMBULATORY_CARE_PROVIDER_SITE_OTHER): Payer: Medicare HMO | Admitting: Family Medicine

## 2016-10-14 VITALS — BP 128/70 | HR 80 | Temp 98.4°F | Resp 16 | Ht 65.0 in | Wt 203.0 lb

## 2016-10-14 DIAGNOSIS — M7989 Other specified soft tissue disorders: Secondary | ICD-10-CM | POA: Diagnosis not present

## 2016-10-14 LAB — BASIC METABOLIC PANEL WITH GFR
BUN: 22 mg/dL (ref 7–25)
CHLORIDE: 103 mmol/L (ref 98–110)
CO2: 22 mmol/L (ref 20–31)
Calcium: 10.2 mg/dL (ref 8.6–10.4)
Creat: 0.87 mg/dL (ref 0.50–0.99)
GFR, Est African American: 79 mL/min (ref >=60)
GFR, Est Non African American: 69 mL/min (ref >=60)
Glucose, Bld: 253 mg/dL — ABNORMAL HIGH (ref 70–99)
POTASSIUM: 4.4 mmol/L (ref 3.5–5.3)
Sodium: 138 mmol/L (ref 135–146)

## 2016-10-14 NOTE — Progress Notes (Signed)
Subjective:    Patient ID: [name], female    DOB: 1950/09/07, 66 y.o.   MRN: [id number]  HPI  09/30/16 Patient has a history of medical noncompliance. Was off her blood pressure medication which had a diuretic in it. Was seen in the emergency room with a occipital headache and neck stiffness a few days ago. Patient had no imaging or lab work at that time. She was treated for musculoskeletal headache. Her headache is some better but she continues to have a pressure-like sensation in her head. She rates it on a scale of 10 is a 4. She also reports decreased hearing in her left ear. After we rinsed out the cerumen impaction that I observed on exam, her hearing improved. She also presents with significant swelling in both legs. She has +1 pitting edema on the dorsum of her foot and on her lower legs up to her knees. Her weight is up 10 pounds. As I said earlier she is off hydrochlorothiazide. She denies any chest pain shortness of breath or dyspnea on exertion.  At that time, my plan was: I'll check the patient's renal function and liver function tests with a CMP. I will evaluate for heart failure with a BNP. Patient is not taking her hydrochlorothiazide/losartan. I asked her to continue to hold this because I will start her on Lasix 40 mg a day.  Recheck her swelling as well as her renal function and potassium next week.  Begin diclofenac 75 mg by mouth twice a day for headache and reassess next week  10/14/16 Patient saw my partner who increased her Lasix to 40 mg a day. She is not taking Hyzaar. Fortunately, her weight has improved by 3 pounds. The pitting edema has also improved in both legs. She is not wearing any compression stockings. She has varicose veins in both legs suggesting chronic venous insufficiency. Her blood sugars average between 120 and 200 in the morning fasting. The majority are 140-150. She is currently taking 55 units of Toujeo.   Past Medical History:  Diagnosis Date  .  Anginal pain ([place]) 03/19/2012   saw Dr. Cathie Olden .. she thinks its more related to stomach issues  . Arthritis    "all over; mostly in my back" (03/20/2012)  . Breast cancer ([place])    "right" (03/20/2012)  . Cardiomyopathy   . Chest pain 06/2005   Hospitalized, dystolic dysfunction  . Chronic lower back pain    "have 2 herniated discs; going to have to have a fusion" (03/20/2012)  . Family history of anesthesia complication    "father has nausea and vomiting"  . GERD (gastroesophageal reflux disease)   . H/O hiatal hernia   . History of kidney stones   . History of right mastectomy   . Hypertension    "patient states never had HTN, takes Hyzaar for heart  . Hypothyroidism   . Irritable bowel syndrome   . Migraines    "it's been a long time since I've had one" (03/20/2012)  . Osteoarthritis   . Peripheral neuropathy   . PONV (postoperative nausea and vomiting)   . Sciatica   . Type II diabetes mellitus ([place])    Past Surgical History:  Procedure Laterality Date  . ABDOMINAL HYSTERECTOMY  1993  . adenosin cardiolite  07/2005   (+) wall motion abnormality  . AXILLARY LYMPH NODE DISSECTION  2003   squamous cell cancer  . BACK SURGERY    . CARDIAC CATHETERIZATION  ? 2005 /  2007  . CT abdomen and pelvis  02/2010   Same  . ESOPHAGOGASTRODUODENOSCOPY  2005   GERD  . KNEE ARTHROSCOPY  1990's   "right" (03/20/2012)  . LUMBAR FUSION N/A 08/22/2014   Procedure: Right L2-3 and right L1-2 transforaminal lumbar interbody fusion with pedicle screws, rods, sleeves, cages, local bone graft, Vivigen, cancellous chips;  Surgeon: Jessy Oto, MD;  Location: [facility];  Service: Orthopedics;  Laterality: N/A;  . LUMBAR LAMINECTOMY/DECOMPRESSION MICRODISCECTOMY N/A 02/04/2014   Procedure: RIGHT L2-3 MICRODISCECTOMY ;  Surgeon: Jessy Oto, MD;  Location: [facility];  Service: Orthopedics;  Laterality: N/A;  . MASTECTOMY WITH AXILLARY LYMPH NODE DISSECTION  12/2003   "right" (03/20/2012)  . POSTERIOR  CERVICAL FUSION/FORAMINOTOMY  2001  . SHOULDER ARTHROSCOPY W/ ROTATOR CUFF REPAIR  2003   Left  . THYROIDECTOMY  1977   Right  . TOTAL KNEE ARTHROPLASTY  2000   Right  . [place] of abdomen  07/2005   Fatty liver / kidney stones   Current Outpatient Prescriptions on File Prior to Visit  Medication Sig Dispense Refill  . aspirin 81 MG tablet Take 81 mg by mouth daily.    . Blood Glucose Calibration (TRUE METRIX LEVEL 2) Normal SOLN Use as directed. 3 each 3  . Blood Glucose Monitoring Suppl (BLOOD GLUCOSE SYSTEM PAK) KIT Use as directed to monitor FSBS 3x daily. Dx: E11.9. Please dispense [employer] True Metrix. 1 each 1  . Cholecalciferol (VITAMIN D3) 5000 UNITS CAPS Take 5,000 Units by mouth 2 (two) times daily.    . cyclobenzaprine (FLEXERIL) 10 MG tablet Take 1 tablet (10 mg total) by mouth 3 (three) times daily as needed for muscle spasms. 20 tablet 0  . diclofenac (VOLTAREN) 75 MG EC tablet Take 1 tablet (75 mg total) by mouth 2 (two) times daily. 30 tablet 0  . fluticasone (FLONASE) 50 MCG/ACT nasal spray Place 2 sprays into both nostrils daily. 48 g 3  . furosemide (LASIX) 40 MG tablet Take 1 tablet (40 mg total) by mouth daily. 30 tablet 3  . gabapentin (NEURONTIN) 300 MG capsule Take 1 capsule (300 mg total) by mouth 3 (three) times daily. 90 capsule 3  . Glucose Blood (BLOOD GLUCOSE TEST STRIPS) STRP Use as directed to monitor FSBS 3x daily. Dx: E11.9. Please dispense [employer] True Metrix. 300 each 3  . HYDROcodone-acetaminophen (NORCO) 5-325 MG tablet Take 1 tablet by mouth every 6 (six) hours as needed (for pain). 20 tablet 0  . Insulin Glargine (TOUJEO SOLOSTAR) 300 UNIT/ML SOPN Inject 140 Units into the skin every morning. 9 pen 2  . Insulin Pen Needle (PEN NEEDLES 5/16") 30G X 8 MM MISC Use to inject insulin 5x daily. 200 each 11  . Lancet Devices MISC Use as directed to monitor FSBS 3x daily. Dx: E11.9. Please dispense TruePlus Ultra Thin. 1 each 1  . Lancets MISC Use as directed to  monitor FSBS 3x daily. Dx: E11.9. Please dispense TruePlus Ultra Thin. 300 each 3  . levothyroxine (SYNTHROID, LEVOTHROID) 100 MCG tablet Take 1 tablet (100 mcg total) by mouth daily. 30 tablet 5  . metFORMIN (GLUCOPHAGE) 850 MG tablet Take 1 tablet (850 mg total) by mouth 2 (two) times daily with a meal. 180 tablet 3  . Misc Natural Products (LAXATIVE FORMULA) TABS Take 1 tablet by mouth as needed (for BM).    . NOVOLOG FLEXPEN 100 UNIT/ML FlexPen Sliding scale    . losartan-hydrochlorothiazide (HYZAAR) 50-12.5 MG tablet Take 1 tablet by  mouth daily. (Patient not taking: Reported on 10/07/2016) 30 tablet 1   No current facility-administered medications on file prior to visit.    Allergies  Allergen Reactions  . Dilaudid [Hydromorphone Hcl] Nausea And Vomiting  . Lipitor [Atorvastatin] Other (See Comments)    Body & Muscle Aches  . Adhesive [Tape] Rash  . Ampicillin Rash    REACTION: Rash and welts on her hands when she took it in the hospital  . Penicillins Rash   Social History   Social History  . Marital status: Married    Spouse name: N/A  . Number of children: 1  . Years of education: N/A   Occupational History  . Retired since 2003    Social History Main Topics  . Smoking status: Never Smoker  . Smokeless tobacco: Never Used  . Alcohol use No  . Drug use: No  . Sexual activity: Not on file   Other Topics Concern  . Not on file   Social History Narrative  . No narrative on file      Review of Systems  All other systems reviewed and are negative.      Objective:   Physical Exam  Constitutional: She is oriented to person, place, and time.  Neck: Neck supple. No JVD present. No thyromegaly present.  Cardiovascular: Normal rate, regular rhythm and normal heart sounds.   No murmur heard. Pulmonary/Chest: Effort normal and breath sounds normal. No respiratory distress. She has no wheezes. She has no rales.  Musculoskeletal: She exhibits edema.  Neurological:  She is alert and oriented to person, place, and time. She has normal reflexes. No cranial nerve deficit. She exhibits normal muscle tone. Coordination normal.  Vitals reviewed.         Assessment & Plan:  Leg swelling - Plan: BASIC METABOLIC PANEL WITH GFR  The swelling is better. I will her to decrease Lasix to 40 mg a day to avoid renal insufficiency and begin wearing compression hose to treat her chronic venous insufficiency and varicose veins is likely the cause of the leg swelling. I will check a BMP to monitor her renal function and potassium after diuresis. If her renal function is stable, I would like to start the patient back on losartan alone  in addition to the Lasix for renal protection. I will avoid the hydrochlorothiazide.  I advised the patient to increase her insulin to 65 units a day and follow-up with her endocrinologist regarding her diabetes

## 2016-10-18 ENCOUNTER — Encounter: Payer: Self-pay | Admitting: Family Medicine

## 2016-10-18 ENCOUNTER — Other Ambulatory Visit: Payer: Self-pay | Admitting: Family Medicine

## 2016-10-18 MED ORDER — LOSARTAN POTASSIUM 100 MG PO TABS: 100.0000 mg | ORAL_TABLET | Freq: Every day | ORAL | 3 refills | Status: DC

## 2016-10-19 ENCOUNTER — Encounter: Payer: Self-pay | Admitting: Endocrinology

## 2016-10-19 ENCOUNTER — Ambulatory Visit (INDEPENDENT_AMBULATORY_CARE_PROVIDER_SITE_OTHER): Payer: Medicare HMO | Admitting: Endocrinology

## 2016-10-19 VITALS — BP 132/82 | HR 69 | Ht 65.0 in | Wt 201.0 lb

## 2016-10-19 DIAGNOSIS — E1142 Type 2 diabetes mellitus with diabetic polyneuropathy: Secondary | ICD-10-CM

## 2016-10-19 DIAGNOSIS — Z794 Long term (current) use of insulin: Secondary | ICD-10-CM | POA: Diagnosis not present

## 2016-10-19 MED ORDER — INSULIN GLARGINE 300 UNIT/ML ~~LOC~~ SOPN: 160.0000 [IU] | PEN_INJECTOR | SUBCUTANEOUS | 2 refills | Status: DC

## 2016-10-19 NOTE — Patient Instructions (Addendum)
check your blood sugar twice a day.  vary the time of day when you check, between before the 3 meals, and at bedtime.  also check if you have symptoms of your blood sugar being too high or too low.  please keep a record of the readings and bring it to your next appointment here (or you can bring the meter itself).  You can write it on any piece of paper.  please call us sooner if your blood sugar goes below 70, or if you have a lot of readings over 200.   Please stop taking the novolog, and: Increase the toujeo to 160 units each morning.   On this type of insulin schedule, you should eat meals on a regular schedule.  If a meal is missed or significantly delayed, your blood sugar could go low.   Please come back for a follow-up appointment in 6 weeks.

## 2016-10-19 NOTE — Progress Notes (Signed)
Subjective:    Patient ID: [name], female    DOB: 25-Jan-1951, 66 y.o.   MRN: [id number]  HPI Pt returns for f/u of diabetes mellitus: DM type: Insulin-requiring type 2 Dx'ed: 5465 Complications: painful polyneuropathy Therapy: insulin since soon after dx. GDM: never DKA: never Severe hypoglycemia: never Pancreatitis: never.   Other: she takes QD insulin, after poor results with multiple daily injections.   Interval history: She takes toujeo, 65 units BID, and SS-novolog (averages approx 30 units total per day).  no cbg record, but states cbg's are in the low-100's.   Past Medical History:  Diagnosis Date  . Anginal pain ([place]) 03/19/2012   saw Dr. Cathie Olden .. she thinks its more related to stomach issues  . Arthritis    "all over; mostly in my back" (03/20/2012)  . Breast cancer ([place])    "right" (03/20/2012)  . Cardiomyopathy   . Chest pain 06/2005   Hospitalized, dystolic dysfunction  . Chronic lower back pain    "have 2 herniated discs; going to have to have a fusion" (03/20/2012)  . Family history of anesthesia complication    "father has nausea and vomiting"  . GERD (gastroesophageal reflux disease)   . H/O hiatal hernia   . History of kidney stones   . History of right mastectomy   . Hypertension    "patient states never had HTN, takes Hyzaar for heart  . Hypothyroidism   . Irritable bowel syndrome   . Migraines    "it's been a long time since I've had one" (03/20/2012)  . Osteoarthritis   . Peripheral neuropathy   . PONV (postoperative nausea and vomiting)   . Sciatica   . Type II diabetes mellitus ([place])     Past Surgical History:  Procedure Laterality Date  . ABDOMINAL HYSTERECTOMY  1993  . adenosin cardiolite  07/2005   (+) wall motion abnormality  . AXILLARY LYMPH NODE DISSECTION  2003   squamous cell cancer  . BACK SURGERY    . CARDIAC CATHETERIZATION  ? 2005 / 2007  . CT abdomen and pelvis  02/2010   Same  . ESOPHAGOGASTRODUODENOSCOPY   2005   GERD  . KNEE ARTHROSCOPY  1990's   "right" (03/20/2012)  . LUMBAR FUSION N/A 08/22/2014   Procedure: Right L2-3 and right L1-2 transforaminal lumbar interbody fusion with pedicle screws, rods, sleeves, cages, local bone graft, Vivigen, cancellous chips;  Surgeon: Jessy Oto, MD;  Location: [facility];  Service: Orthopedics;  Laterality: N/A;  . LUMBAR LAMINECTOMY/DECOMPRESSION MICRODISCECTOMY N/A 02/04/2014   Procedure: RIGHT L2-3 MICRODISCECTOMY ;  Surgeon: Jessy Oto, MD;  Location: [facility];  Service: Orthopedics;  Laterality: N/A;  . MASTECTOMY WITH AXILLARY LYMPH NODE DISSECTION  12/2003   "right" (03/20/2012)  . POSTERIOR CERVICAL FUSION/FORAMINOTOMY  2001  . SHOULDER ARTHROSCOPY W/ ROTATOR CUFF REPAIR  2003   Left  . THYROIDECTOMY  1977   Right  . TOTAL KNEE ARTHROPLASTY  2000   Right  . [place] of abdomen  07/2005   Fatty liver / kidney stones    Social History   Social History  . Marital status: Married    Spouse name: N/A  . Number of children: 1  . Years of education: N/A   Occupational History  . Retired since 2003    Social History Main Topics  . Smoking status: Never Smoker  . Smokeless tobacco: Never Used  . Alcohol use No  . Drug use: No  . Sexual activity:  Not on file   Other Topics Concern  . Not on file   Social History Narrative  . No narrative on file    Current Outpatient Prescriptions on File Prior to Visit  Medication Sig Dispense Refill  . aspirin 81 MG tablet Take 81 mg by mouth daily.    . Blood Glucose Calibration (TRUE METRIX LEVEL 2) Normal SOLN Use as directed. 3 each 3  . Blood Glucose Monitoring Suppl (BLOOD GLUCOSE SYSTEM PAK) KIT Use as directed to monitor FSBS 3x daily. Dx: E11.9. Please dispense [employer] True Metrix. 1 each 1  . Cholecalciferol (VITAMIN D3) 5000 UNITS CAPS Take 5,000 Units by mouth 2 (two) times daily.    . cyclobenzaprine (FLEXERIL) 10 MG tablet Take 1 tablet (10 mg total) by mouth 3 (three) times daily as needed  for muscle spasms. 20 tablet 0  . diclofenac (VOLTAREN) 75 MG EC tablet Take 1 tablet (75 mg total) by mouth 2 (two) times daily. 30 tablet 0  . fluticasone (FLONASE) 50 MCG/ACT nasal spray Place 2 sprays into both nostrils daily. 48 g 3  . furosemide (LASIX) 40 MG tablet Take 1 tablet (40 mg total) by mouth daily. 30 tablet 3  . gabapentin (NEURONTIN) 300 MG capsule Take 1 capsule (300 mg total) by mouth 3 (three) times daily. 90 capsule 3  . Glucose Blood (BLOOD GLUCOSE TEST STRIPS) STRP Use as directed to monitor FSBS 3x daily. Dx: E11.9. Please dispense [employer] True Metrix. 300 each 3  . HYDROcodone-acetaminophen (NORCO) 5-325 MG tablet Take 1 tablet by mouth every 6 (six) hours as needed (for pain). 20 tablet 0  . Insulin Pen Needle (PEN NEEDLES 5/16") 30G X 8 MM MISC Use to inject insulin 5x daily. 200 each 11  . Lancet Devices MISC Use as directed to monitor FSBS 3x daily. Dx: E11.9. Please dispense TruePlus Ultra Thin. 1 each 1  . Lancets MISC Use as directed to monitor FSBS 3x daily. Dx: E11.9. Please dispense TruePlus Ultra Thin. 300 each 3  . levothyroxine (SYNTHROID, LEVOTHROID) 100 MCG tablet Take 1 tablet (100 mcg total) by mouth daily. 30 tablet 5  . losartan (COZAAR) 100 MG tablet Take 1 tablet (100 mg total) by mouth daily. 90 tablet 3  . metFORMIN (GLUCOPHAGE) 850 MG tablet Take 1 tablet (850 mg total) by mouth 2 (two) times daily with a meal. 180 tablet 3  . Misc Natural Products (LAXATIVE FORMULA) TABS Take 1 tablet by mouth as needed (for BM).     No current facility-administered medications on file prior to visit.     Allergies  Allergen Reactions  . Dilaudid [Hydromorphone Hcl] Nausea And Vomiting  . Lipitor [Atorvastatin] Other (See Comments)    Body & Muscle Aches  . Adhesive [Tape] Rash  . Ampicillin Rash    REACTION: Rash and welts on her hands when she took it in the hospital  . Penicillins Rash    Family History  Problem Relation Age of Onset  . Hypertension  Mother   . Diabetes Father   . Hypertension Father   . Hypertension Sister   . Hypertension Brother   . Hypertension Brother   . Diabetes Brother   . Diabetes Other        Family Hx. of Diabetes  . Hypertension Other        Family History of HTN    BP 132/82   Pulse 69   Ht '5\' 5"'  (1.651 m)   Wt 201 lb (91.2 kg)  SpO2 95%   BMI 33.45 kg/m   Review of Systems She denies hypoglycemia.      Objective:   Physical Exam VITAL SIGNS:  See vs page GENERAL: no distress Pulses: dorsalis pedis intact bilat.   MSK: no deformity of the feet CV: 1+ bilat leg edema Skin:  no ulcer on the feet.  normal color and temp on the feet.  Neuro: sensation is intact to touch on the feet, but decreased from normal.   Lab Results  Component Value Date   HGBA1C 11.3 09/07/2016      Assessment & Plan:  Insulin-requiring type 2 DM, with polyneuropathy: she needs increased rx.    Patient Instructions  check your blood sugar twice a day.  vary the time of day when you check, between before the 3 meals, and at bedtime.  also check if you have symptoms of your blood sugar being too high or too low.  please keep a record of the readings and bring it to your next appointment here (or you can bring the meter itself).  You can write it on any piece of paper.  please call us sooner if your blood sugar goes below 70, or if you have a lot of readings over 200.   Please stop taking the novolog, and: Increase the toujeo to 160 units each morning.   On this type of insulin schedule, you should eat meals on a regular schedule.  If a meal is missed or significantly delayed, your blood sugar could go low.   Please come back for a follow-up appointment in 6 weeks.

## 2016-10-20 ENCOUNTER — Other Ambulatory Visit: Payer: Self-pay | Admitting: *Deleted

## 2016-10-25 ENCOUNTER — Encounter: Payer: Self-pay | Admitting: Family Medicine

## 2016-10-28 ENCOUNTER — Ambulatory Visit: Payer: Medicare HMO | Admitting: Dietician

## 2016-11-15 ENCOUNTER — Other Ambulatory Visit: Payer: Self-pay | Admitting: Family Medicine

## 2016-11-19 IMAGING — CR DG CHEST 2V
2 series · 2 of 2 positions shown · non-contrast
Comparison: 03/19/2012

CLINICAL DATA: Chest pain

EXAM:
CHEST  2 VIEW

[chest pa]
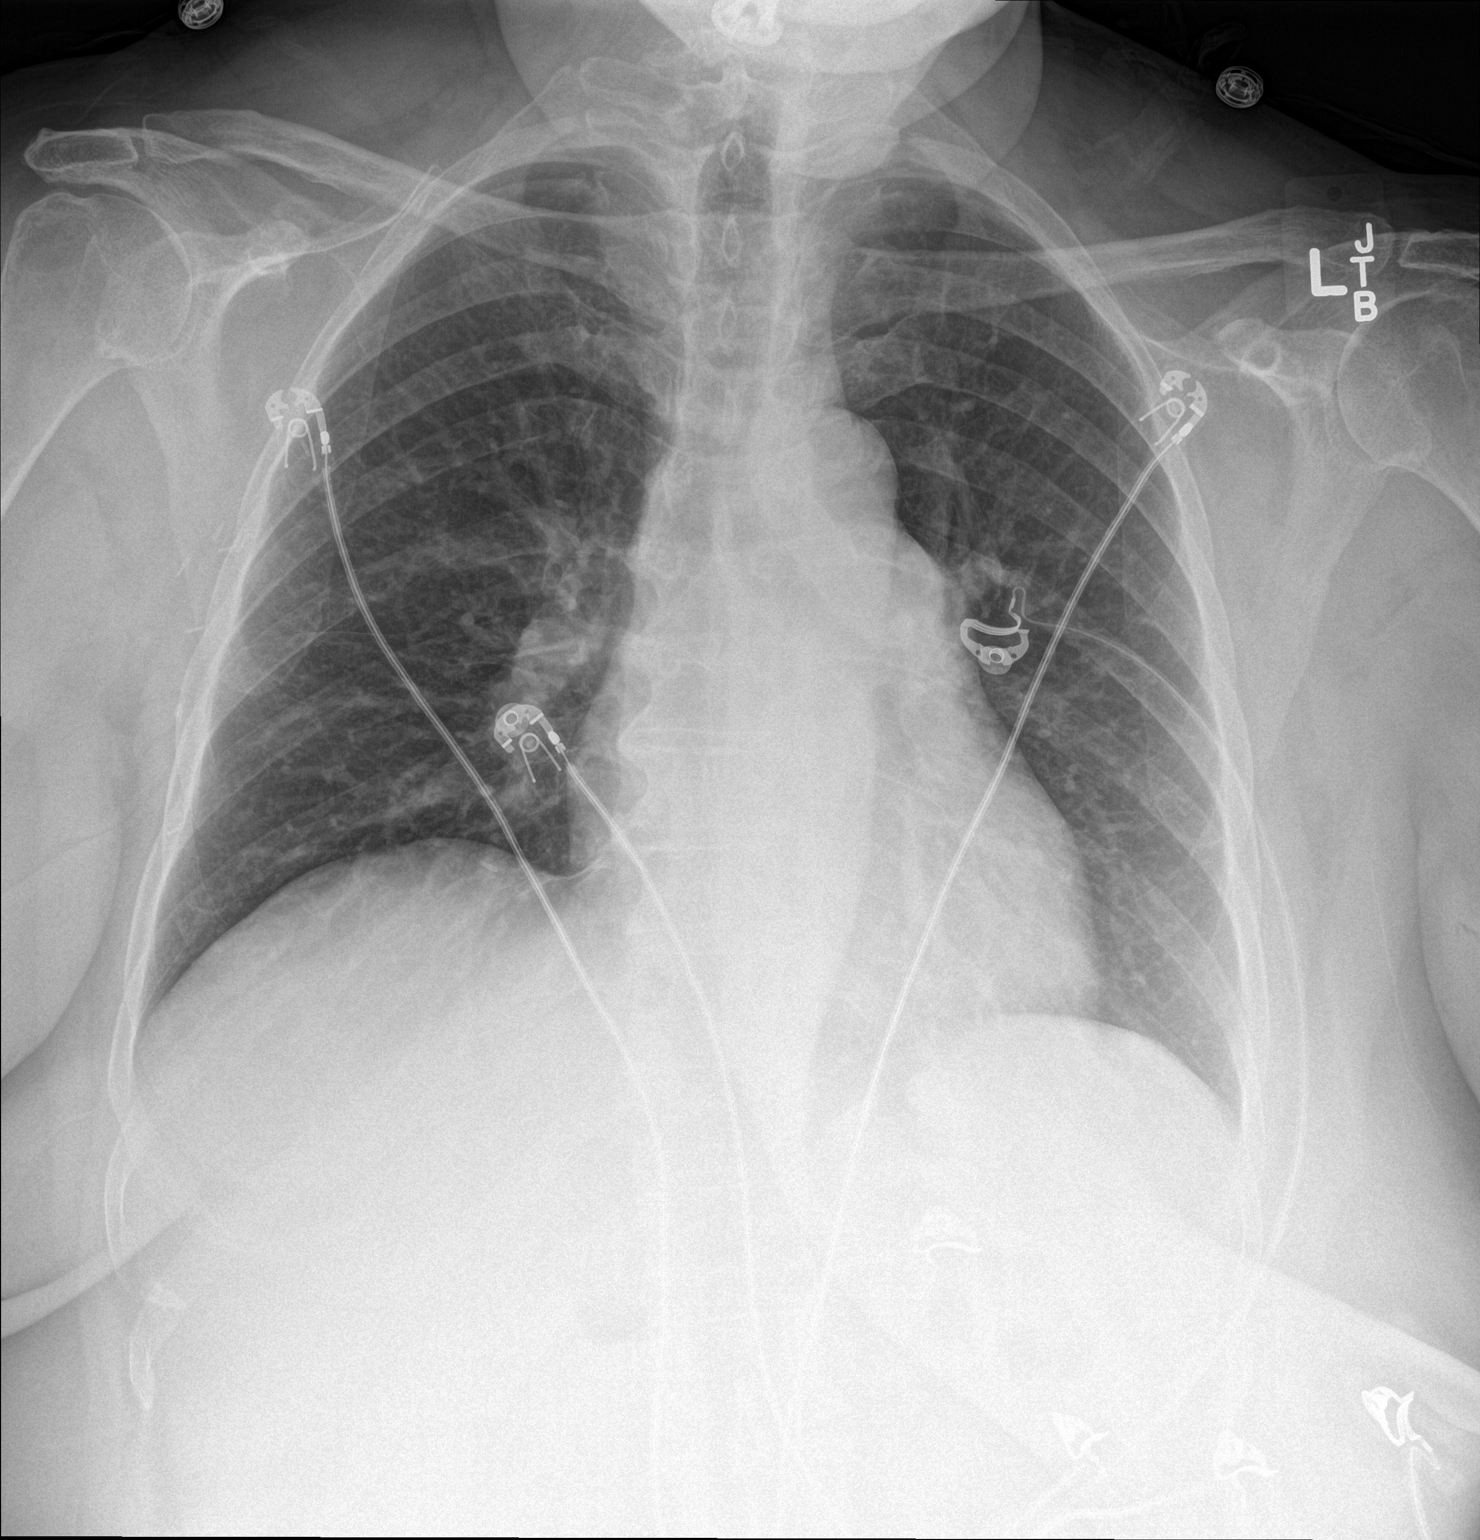

[chest lat]
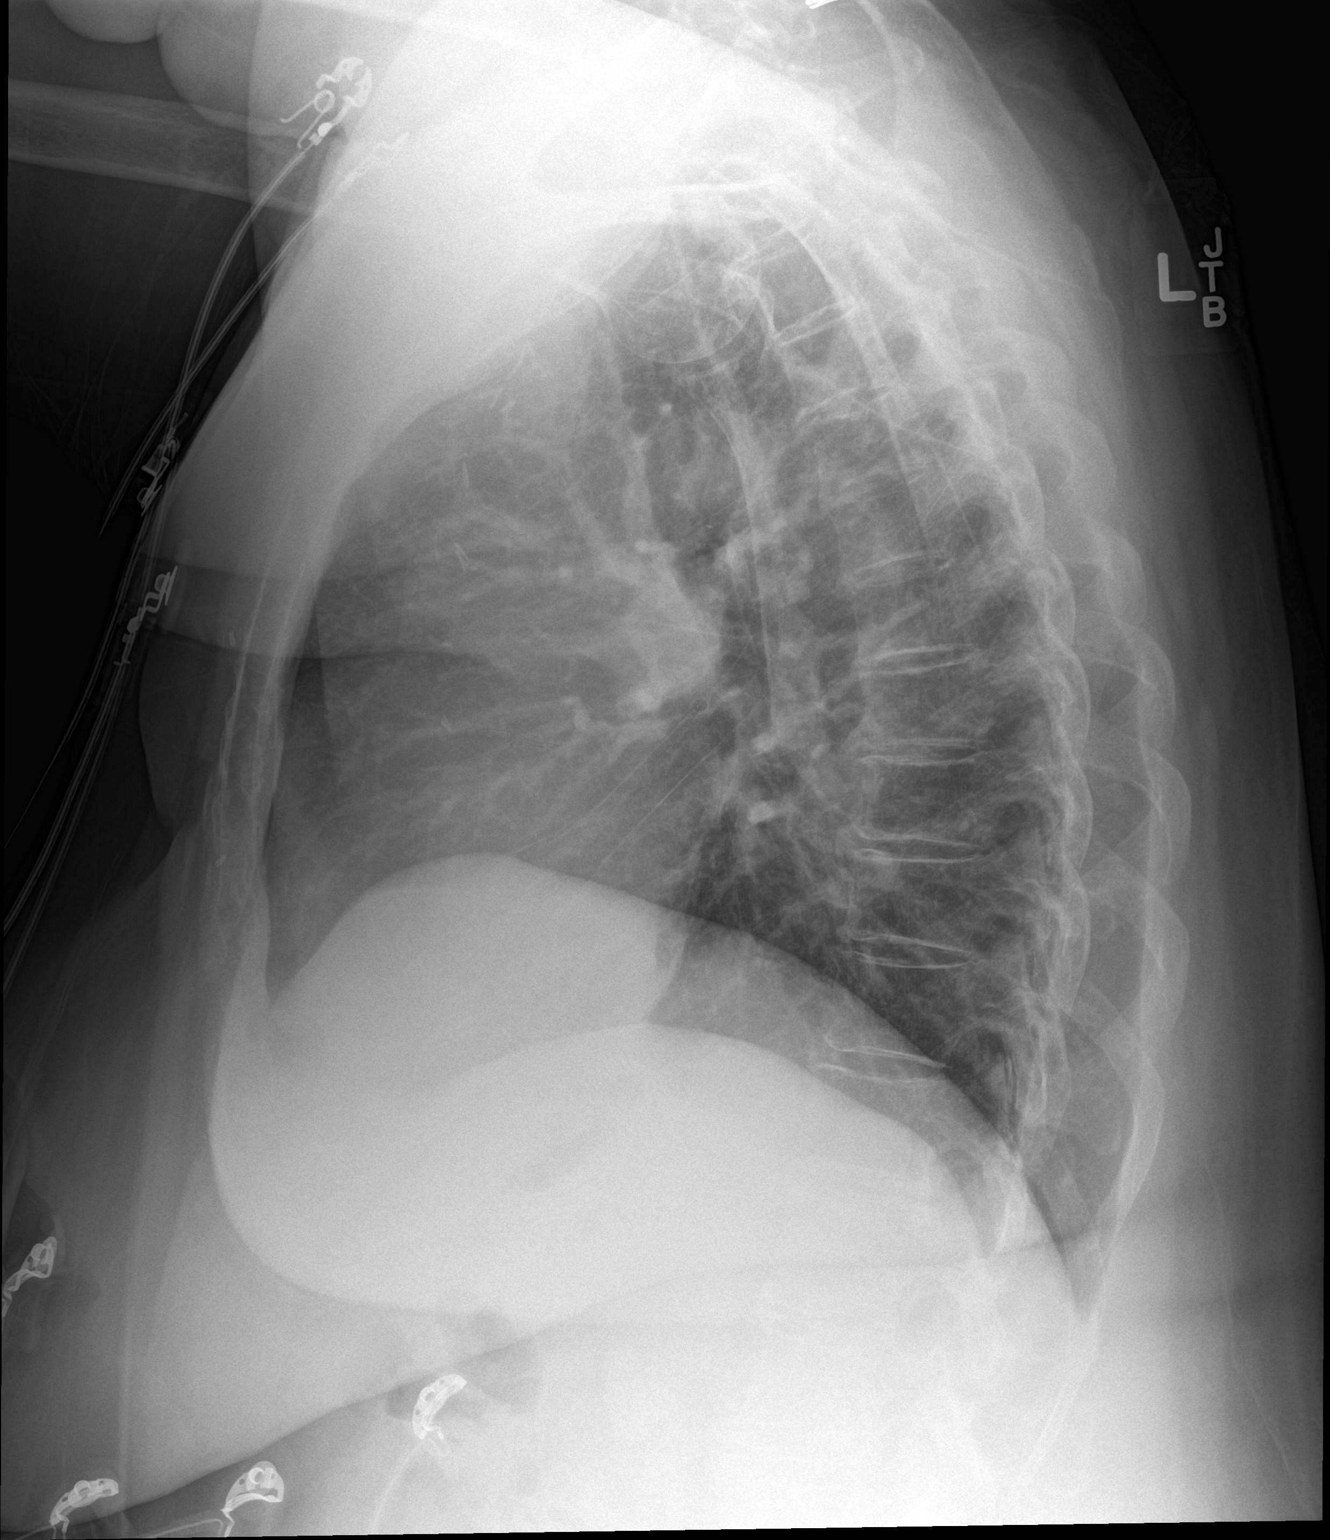

[2 of 2 positions shown; findings below may reference images not displayed]

FINDINGS: The heart size and mediastinal contours are within normal limits.
Both lungs are clear. The visualized skeletal structures are
unremarkable.
IMPRESSION: No active cardiopulmonary disease.

## 2016-11-22 ENCOUNTER — Telehealth: Payer: Self-pay | Admitting: Family Medicine

## 2016-11-22 NOTE — Telephone Encounter (Signed)
Patient left vm asking for samples of  Insulin Glargine (TOUJEO SOLOSTAR) 300 UNIT/ML SOPN  CB# [phone]

## 2016-11-23 NOTE — Telephone Encounter (Signed)
Spoke with patient samples have been left for her she is aware

## 2016-12-01 ENCOUNTER — Ambulatory Visit: Payer: Medicare HMO | Admitting: Endocrinology

## 2016-12-13 IMAGING — RF DG LUMBAR SPINE COMPLETE 4+V
1 series · 4 of 4 positions shown · non-contrast
Comparison: 04/30/2012, 02/04/2014, 04/30/2012 and MRI 06/02/2014

CLINICAL DATA: Hardware extension, right L2-3 and right L1-2
transforaminal lumbar interbody fusion with pedicle screws, rods,
CT's, cages and local bone graft,Vivigen, cancellous chips.

EXAM:
DG C-ARM GT 120 MIN; LUMBAR SPINE - COMPLETE 4+ VIEW
FLUOROSCOPY TIME:  Radiation Exposure Index (as provided by the
fluoroscopic device):
If the device does not provide the exposure index:
Fluoroscopy Time (in minutes and seconds):  1 minutes 22 seconds
Number of Acquired Images:  4

[Series 1: run · 4 of 4 slices shown]
[im 1/4]
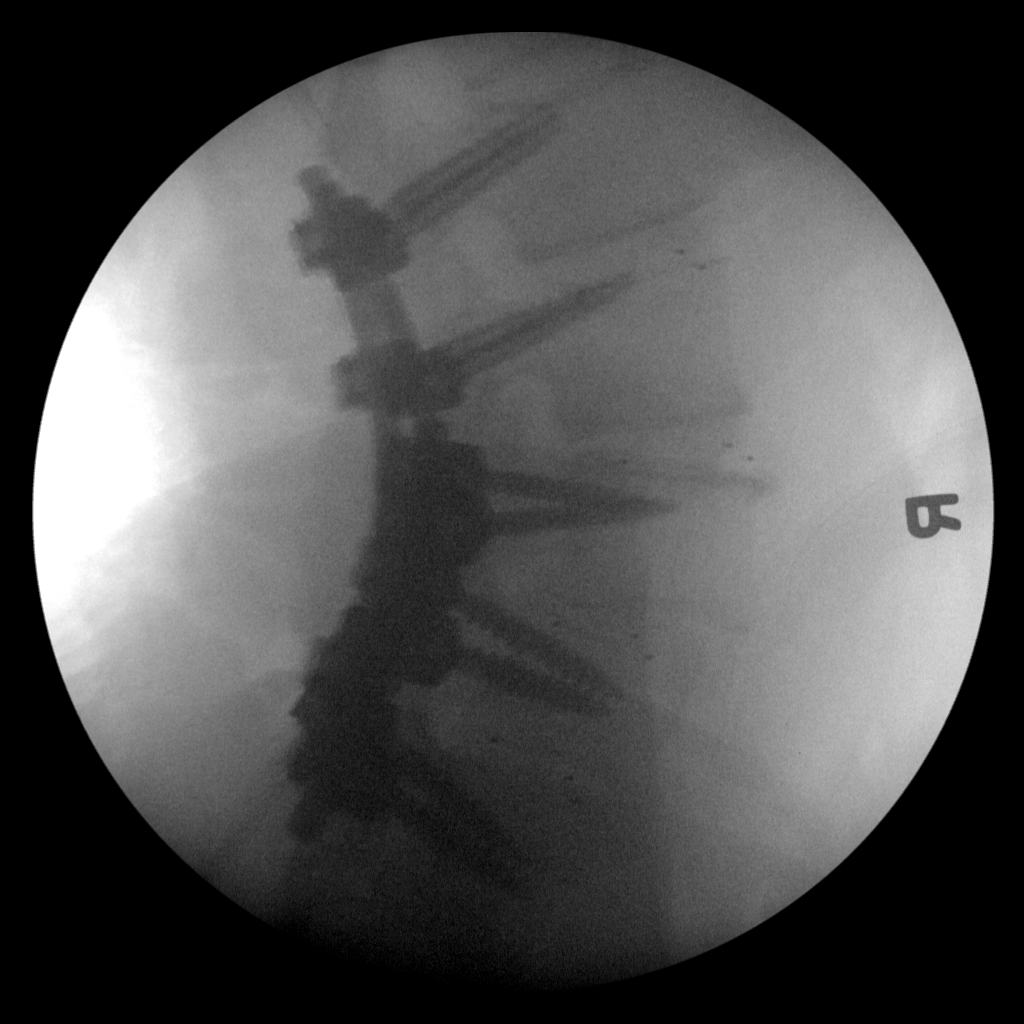
[im 2/4]
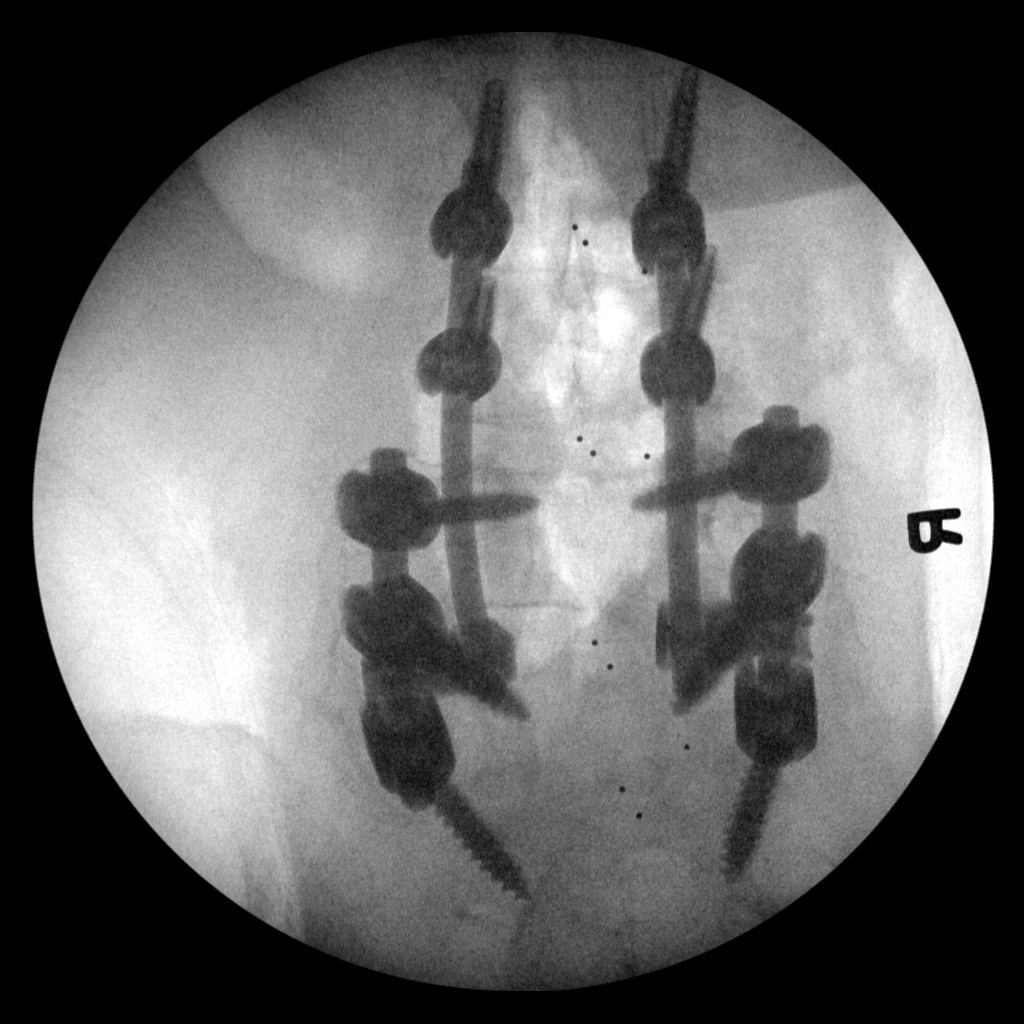
[im 3/4]
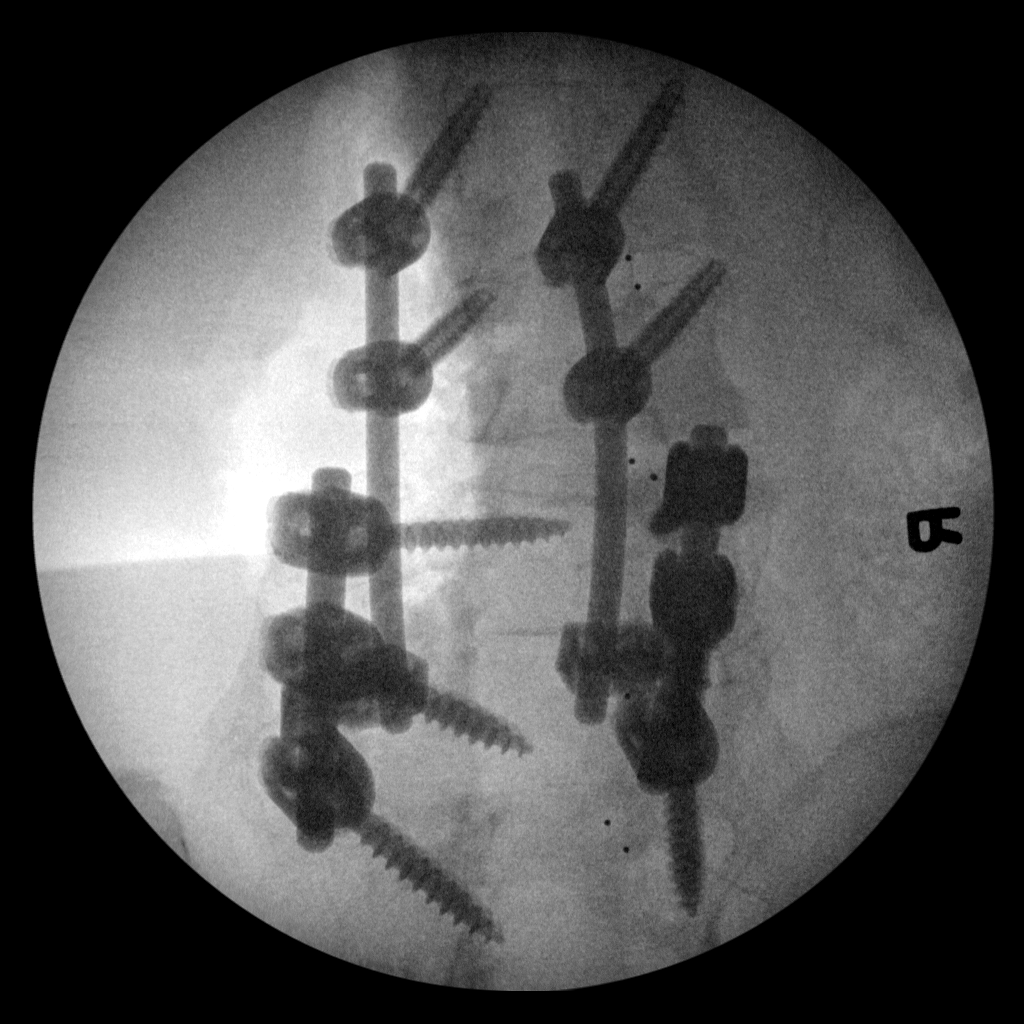
[im 4/4]
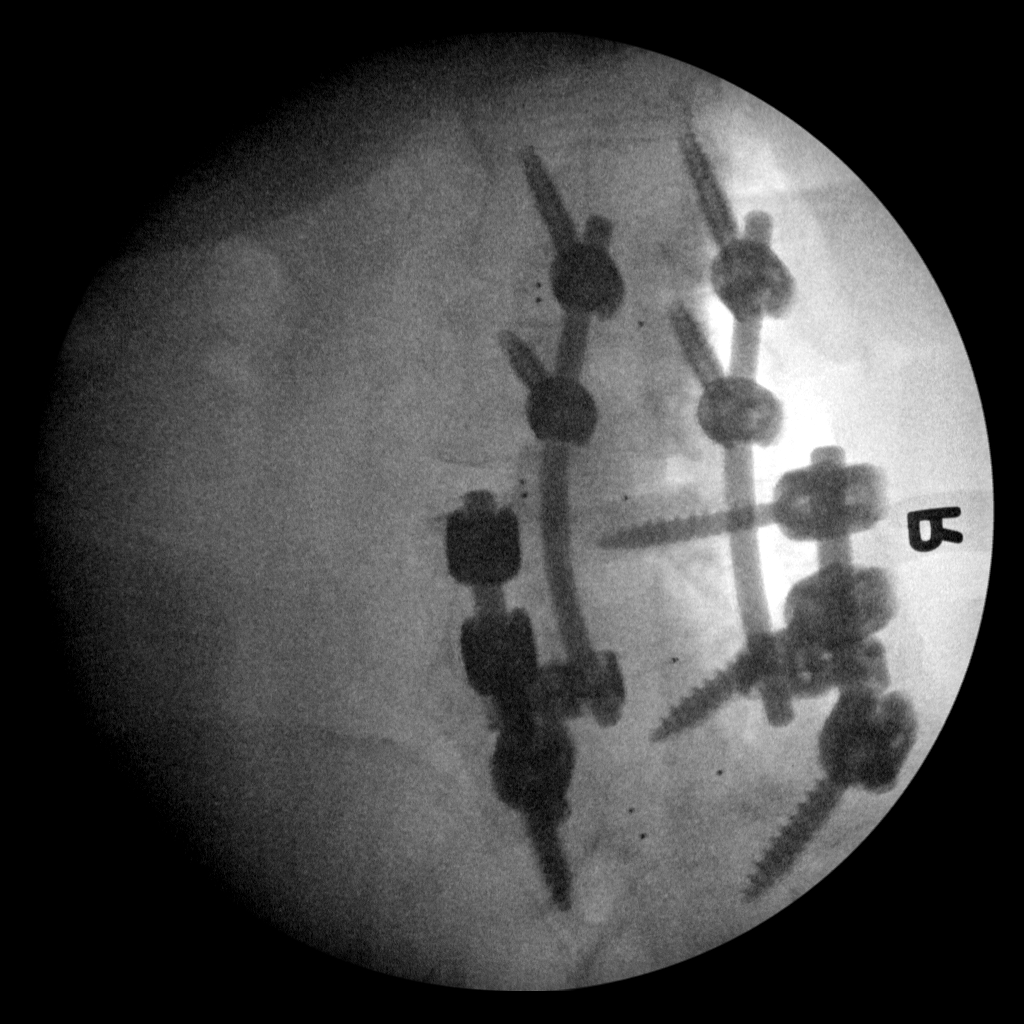

[4 of 4 positions shown; findings below may reference images not displayed]

FINDINGS: Vertebral body level labeling will reflect the above history,
although note that prior exams suggest transitional anatomy as
labeling has varied.

Exam demonstrates no change in posterior fusion hardware L3 through
L5 with extension of posterior fusion hardware at the L1 and L2
levels intact. Continued mild spondylosis of the lumbar spine and
continued subtle grade 1 anterolisthesis of L3 on L4 unchanged.
IMPRESSION: Findings compatible with recent postsurgical superior extension of
posterior fusion hardware which is intact.

## 2016-12-19 ENCOUNTER — Encounter: Payer: Self-pay | Admitting: Family Medicine

## 2016-12-19 ENCOUNTER — Ambulatory Visit (INDEPENDENT_AMBULATORY_CARE_PROVIDER_SITE_OTHER): Payer: Medicare HMO | Admitting: Family Medicine

## 2016-12-19 VITALS — BP 130/84 | HR 63 | Temp 98.3°F | Resp 12 | Ht 65.0 in | Wt 202.0 lb

## 2016-12-19 DIAGNOSIS — H8113 Benign paroxysmal vertigo, bilateral: Secondary | ICD-10-CM

## 2016-12-19 DIAGNOSIS — M5030 Other cervical disc degeneration, unspecified cervical region: Secondary | ICD-10-CM

## 2016-12-19 DIAGNOSIS — E039 Hypothyroidism, unspecified: Secondary | ICD-10-CM

## 2016-12-19 DIAGNOSIS — E1142 Type 2 diabetes mellitus with diabetic polyneuropathy: Secondary | ICD-10-CM | POA: Diagnosis not present

## 2016-12-19 DIAGNOSIS — Z794 Long term (current) use of insulin: Secondary | ICD-10-CM

## 2016-12-19 DIAGNOSIS — R42 Dizziness and giddiness: Secondary | ICD-10-CM

## 2016-12-19 MED ORDER — NAPROXEN SODIUM 220 MG PO TABS: 220.0000 mg | ORAL_TABLET | Freq: Two times a day (BID) | ORAL | 0 refills | Status: DC

## 2016-12-19 MED ORDER — MECLIZINE HCL 12.5 MG PO TABS: 12.5000 mg | ORAL_TABLET | Freq: Three times a day (TID) | ORAL | 0 refills | Status: DC | PRN

## 2016-12-19 NOTE — Patient Instructions (Addendum)
Referral to endocrinology  Referral to Dr. Louanne Skye  Meclizine for dizziness, ENT  F/U Dr. Dennard Schaumann

## 2016-12-19 NOTE — Assessment & Plan Note (Signed)
Diabetes is uncontrolled very compliant with her medications. She wants to get a different endocrinologist. Also Asked him to go back on her sliding scale which was not working for her before. I will refer her to a different endocrinologist that they go ahead and check her N0U metabolic panel today she's been having increased dizzy spells. But admitted to think her dizziness is more positional vertigo she was evaluated last year for dizziness and same symptoms with balance issues. She had MRI of the brain obtained which was benign. We discussed going to her nose and throat versus physical therapy but her symptoms finally resolved. I will give her some meclizine today. At the end of this as she was leaving state that she does occasionally get some ringing in her ears as well. I will go ahead and set her up with ear nose and throat as well.

## 2016-12-19 NOTE — Progress Notes (Signed)
Subjective:    Patient ID: [name], female    DOB: 12-Sep-1950, 66 y.o.   MRN: [id number]  Patient presents for pain all over body (arms , legs, went on trip last weekend not sure if thisis the cause); sharp neckpain (going on couple of months); and off balance (going on couple of months )   Went on a long ride to [place], last weekend, when she came back was hurting all over, she couldn't sleep, felt pain and soreness all over. That has improved. Feels off balance and dizzy for the past month or so. No falls due to dizziness. She has to hold on to people.   Neck pain- with right ear pain for months on and off. Also gets headaches and stiffinesss in neck. She does get tingling fingertips told it was from her neuropathy, also history of carpal tunnel.  Reviewd chart had MRI of C spine in 2017, history of fusion   MRI Dec 2017- IMPRESSION: 1. Cervical spondylosis and degenerative disc disease causing prominent impingement at C3-4 ; and mild impingement at C2-3, C6-7, and T2- 3, as detailed above. 2. Prior fusion at the C4-5 level. DISH causes flowing fused anterior osteophytes at C5-C6-C7-T1. 3. A 5 by 2 by 2 mm abnormal focus of increased T2 signal in the right anterior cervical cord at C4-5, probably from myelomalacia. No abnormal enhancement in this vicinity.   Take tylenol for pain with minimal improvement   DM- followed by Dr. Loanne Pratt, states uncontrolled, told to take 160 units of insulin , but CBG are still 200's, she has stopped going to Dr. Loanne Pratt she did not like what he was doing , last A1C 11.3   Has not taken her insulin today    Review Of Systems:  GEN- denies fatigue, fever, weight loss,weakness, recent illness HEENT- denies eye drainage, change in vision, nasal discharge, CVS- denies chest pain, palpitations RESP- denies SOB, cough, wheeze ABD- denies N/V, change in stools, abd pain GU- denies dysuria, hematuria, dribbling, incontinence MSK-  +joint pain, muscle aches, injury Neuro- denies headache, +dizziness, syncope, seizure activity       Objective:    BP 130/84   Pulse 63   Temp 98.3 F (36.8 C) (Oral)   Resp 12   Ht 5\' 5"  (1.651 m)   Wt 202 lb (91.6 kg)   SpO2 98%   BMI 33.61 kg/m  GEN- NAD, alert and oriented x3 HEENT- PERRL, EOMI, non injected sclera, pink conjunctiva, MMM, oropharynx clear, TM clear bilat , no effusion  Neck- Supple, mild TTP neck no spasm, decreased ROM CVS- RRR, no murmur RESP-CTAB NEURO- CNII-XII in tact, dizzy after sitting up, no nystagmus, equivical rhomberg, moving extremeites equally, gait slow, holds onto wall as needed  EXT- No edema Pulses- Radial 2+        Assessment & Plan:      Problem List Items Addressed This Visit      Unprioritized   Diabetic neuropathy ([place]) - Primary   Hypothyroidism    We checked thyroid function studies      Relevant Orders   TSH   T3, free   T4, free   Diabetes with neurologic complications ([place])    Diabetes is uncontrolled very compliant with her medications. She wants to get a different endocrinologist. Also Asked him to go back on her sliding scale which was not working for her before. I will refer her to a different endocrinologist that they go ahead and check her  G4F metabolic panel today she's been having increased dizzy spells. But admitted to think her dizziness is more positional vertigo she was evaluated last year for dizziness and same symptoms with balance issues. She had MRI of the brain obtained which was benign. We discussed going to her nose and throat versus physical therapy but her symptoms finally resolved. I will give her some meclizine today. At the end of this as she was leaving state that she does occasionally get some ringing in her ears as well. I will go ahead and set her up with ear nose and throat as well.      Relevant Orders   Hemoglobin U0T   Basic metabolic panel   CBC with Differential/Platelet   DDD  (degenerative disc disease), cervical    I'll refer her back to her surgeon with regards to her neck pain due to degenerative disc disease she is taking Aleve twice a day which helps for pain. She is already on gabapentin because of her diabetic neuropathy. MRI was done last in October 2017      Relevant Medications   naproxen sodium (ANAPROX) 220 MG tablet    Other Visit Diagnoses    Dizziness       Benign paroxysmal positional vertigo due to bilateral vestibular disorder          Note: This dictation was prepared with Dragon dictation along with smaller phrase technology. Any transcriptional errors that result from this process are unintentional.

## 2016-12-19 NOTE — Assessment & Plan Note (Signed)
I'll refer her back to her surgeon with regards to her neck pain due to degenerative disc disease she is taking Aleve twice a day which helps for pain. She is already on gabapentin because of her diabetic neuropathy. MRI was done last in October 2017

## 2016-12-19 NOTE — Assessment & Plan Note (Signed)
We checked thyroid function studies

## 2016-12-20 LAB — CBC WITH DIFFERENTIAL/PLATELET
BASOS PCT: 0.9 %
Basophils Absolute: 59 cells/uL (ref 0–200)
EOS PCT: 2.6 %
Eosinophils Absolute: 172 cells/uL (ref 15–500)
HCT: 43.6 % (ref 35.0–45.0)
HEMOGLOBIN: 14.3 g/dL (ref 11.7–15.5)
Lymphs Abs: 2620 cells/uL (ref 850–3900)
MCH: 29 pg (ref 27.0–33.0)
MCHC: 32.8 g/dL (ref 32.0–36.0)
MCV: 88.4 fL (ref 80.0–100.0)
MONOS PCT: 5.8 %
MPV: 10.2 fL (ref 7.5–12.5)
NEUTROS ABS: 3366 {cells}/uL (ref 1500–7800)
Neutrophils Relative %: 51 %
Platelets: 270 10*3/uL (ref 140–400)
RBC: 4.93 10*6/uL (ref 3.80–5.10)
RDW: 13.1 % (ref 11.0–15.0)
Total Lymphocyte: 39.7 %
WBC mixed population: 383 cells/uL (ref 200–950)
WBC: 6.6 10*3/uL (ref 3.8–10.8)

## 2016-12-20 LAB — BASIC METABOLIC PANEL WITH GFR
BUN: 21 mg/dL (ref 7–25)
CALCIUM: 10.4 mg/dL (ref 8.6–10.4)
CHLORIDE: 106 mmol/L (ref 98–110)
CO2: 23 mmol/L (ref 20–32)
Creat: 0.76 mg/dL (ref 0.50–0.99)
GFR, EST NON AFRICAN AMERICAN: 81 mL/min/{1.73_m2} (ref >=60)
GFR, Est African American: 93 mL/min/{1.73_m2} (ref >=60)
Glucose, Bld: 270 mg/dL — ABNORMAL HIGH (ref 65–99)
POTASSIUM: 4.5 mmol/L (ref 3.5–5.3)
SODIUM: 138 mmol/L (ref 135–146)

## 2016-12-20 LAB — HEMOGLOBIN A1C
EAG (MMOL/L): 12.2 (calc)
Hgb A1c MFr Bld: 9.3 % of total Hgb — ABNORMAL HIGH (ref <5.7)
MEAN PLASMA GLUCOSE: 220 (calc)

## 2016-12-20 LAB — T3, FREE: T3 FREE: 2.8 pg/mL (ref 2.3–4.2)

## 2016-12-20 LAB — T4, FREE: FREE T4: 1.1 ng/dL (ref 0.8–1.8)

## 2016-12-20 LAB — TSH: TSH: 2.23 m[IU]/L (ref 0.40–4.50)

## 2016-12-21 ENCOUNTER — Ambulatory Visit: Payer: Medicare HMO | Admitting: Podiatry

## 2016-12-23 ENCOUNTER — Encounter: Payer: Self-pay | Admitting: Family Medicine

## 2016-12-27 ENCOUNTER — Encounter: Payer: Self-pay | Admitting: Podiatry

## 2016-12-27 ENCOUNTER — Ambulatory Visit (INDEPENDENT_AMBULATORY_CARE_PROVIDER_SITE_OTHER): Payer: Medicare HMO | Admitting: Podiatry

## 2016-12-27 DIAGNOSIS — B351 Tinea unguium: Secondary | ICD-10-CM

## 2016-12-27 DIAGNOSIS — E1142 Type 2 diabetes mellitus with diabetic polyneuropathy: Secondary | ICD-10-CM

## 2016-12-27 DIAGNOSIS — M79676 Pain in unspecified toe(s): Secondary | ICD-10-CM | POA: Diagnosis not present

## 2016-12-27 NOTE — Patient Instructions (Signed)

## 2016-12-27 NOTE — Progress Notes (Signed)
Patient ID: [name], female   DOB: 01/19/1951, 66 y.o.   MRN: [id number]   Subjective: Patient presents for a scheduled visit complaining that her toenails are elongated and thickened and are uncomfortable  When walking wearing shoes in requesting nail debridement Patient is diabetic and denies history of foot ulceration or claudication Complains of burning, tenderness and shooting pain persistently on and off weightbearing Patient has a history of chemotherapy 2 further exacerbating symptoms of neuropathy Patient denies smoking history  Objective: Patient states approximately 5 feet 5 inches weighs approximately 207 pounds Peripheral pitting edema bilaterally DP and PT pulses 2/4 bilaterally Capillary reflex within normal limits bilaterally Sensation to 10 g monofilament wire intact 3/8 right and 4/8 left Vibratory sensation nonreactive bilaterally Ankle reflexes weakly reactive bilaterally No open skin lesions bilaterally Pap smear growth bilaterally Toenails elongated, discolored, hypertrophic and tender to direct palpation 6-10 HAV left Hammertoe second left Manual motor testing dorsi flexion, plantar flexion 5/5 bilaterally  Assessment: Diabetic with painful peripheral neuropathy Symptomatic mycotic toenails 6-10  Plan: Debridement of toenails 6-10 mechanically and electrically without any bleeding  Reappoint 3 months

## 2017-01-02 DIAGNOSIS — M26623 Arthralgia of bilateral temporomandibular joint: Secondary | ICD-10-CM | POA: Diagnosis not present

## 2017-01-02 DIAGNOSIS — K219 Gastro-esophageal reflux disease without esophagitis: Secondary | ICD-10-CM | POA: Diagnosis not present

## 2017-01-02 DIAGNOSIS — R070 Pain in throat: Secondary | ICD-10-CM | POA: Diagnosis not present

## 2017-01-02 DIAGNOSIS — H9113 Presbycusis, bilateral: Secondary | ICD-10-CM | POA: Diagnosis not present

## 2017-01-02 DIAGNOSIS — R42 Dizziness and giddiness: Secondary | ICD-10-CM | POA: Diagnosis not present

## 2017-01-05 IMAGING — CT CT ABD-PELV W/ CM
2 of 5 series · 15 of 46 positions shown, 17 images · IV contrast (omnipaque)
Comparison: CT of the abdomen and pelvis performed 02/14/2014, and
lumbar spine MRI performed 06/02/2014

CLINICAL DATA: Subacute onset of left lower quadrant abdominal pain
and vomiting. Constipation. Initial encounter.

EXAM:
CT ABDOMEN AND PELVIS WITH CONTRAST
TECHNIQUE: Multidetector CT imaging of the abdomen and pelvis was performed
using the standard protocol following bolus administration of
intravenous contrast.
CONTRAST:  100mL OMNIPAQUE IOHEXOL 300 MG/ML  SOLN

[Series 2: abd/ pelvis 5.0 i30f 1 · axial · 0.76mm/px · z∈[+857,+1302]mm · 12 of 101 slices shown, 14 images]
[im 6/101  soft-tissue]
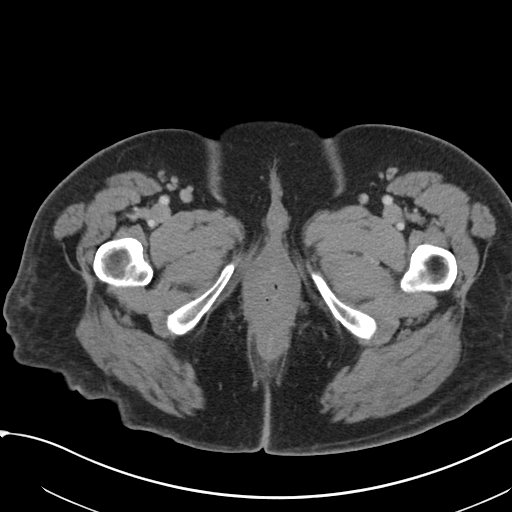
[im 6/101  bone]
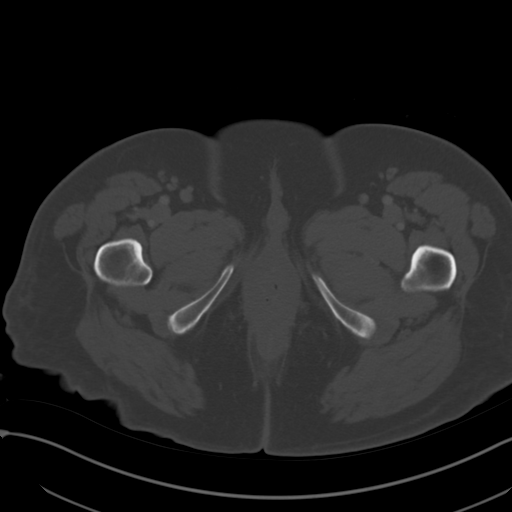
[im 17/101  soft-tissue]
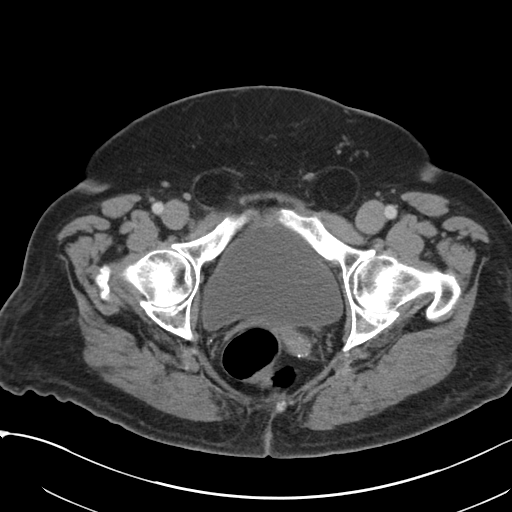
[im 23/101  soft-tissue]
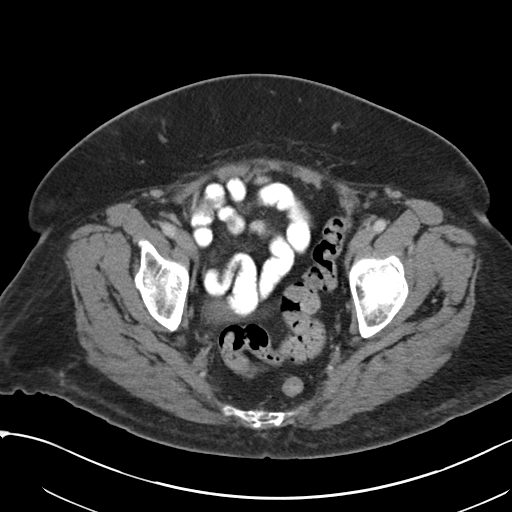
[im 28/101  soft-tissue]
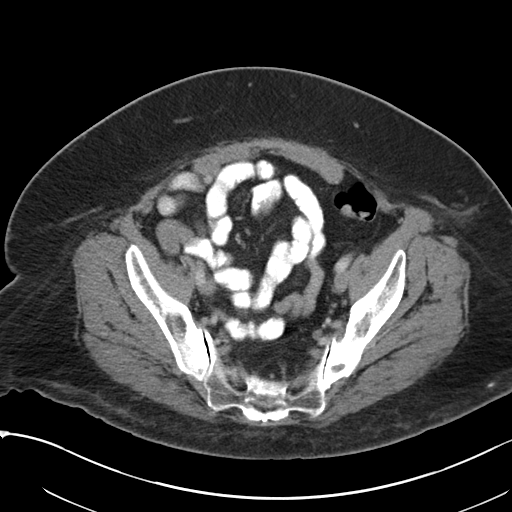
[im 39/101  soft-tissue]
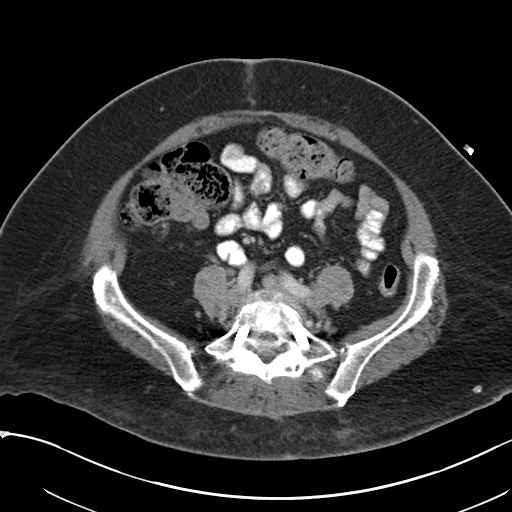
[im 45/101  soft-tissue]
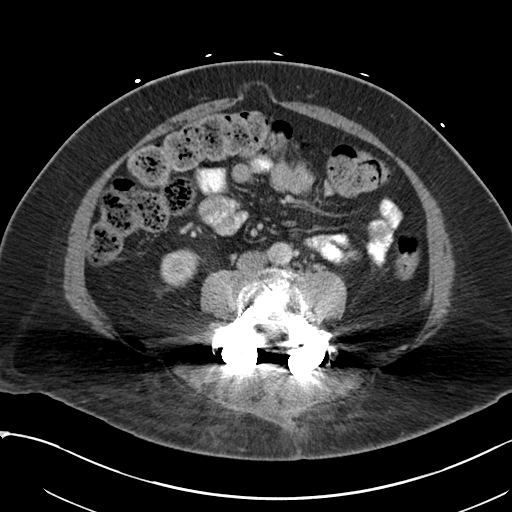
[im 56/101  soft-tissue]
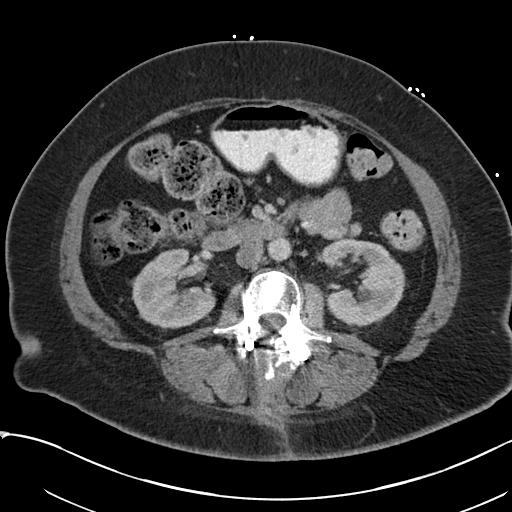
[im 62/101  soft-tissue]
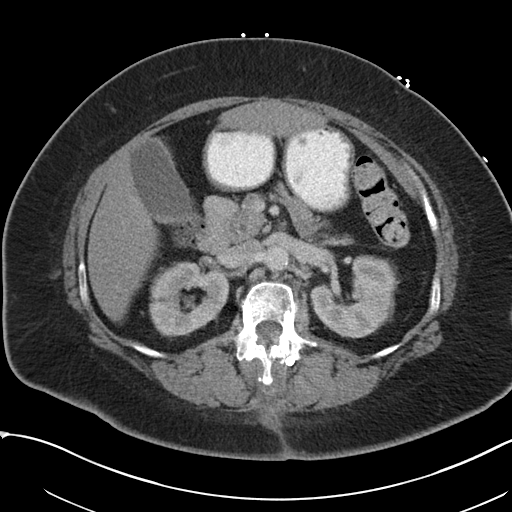
[im 73/101  soft-tissue]
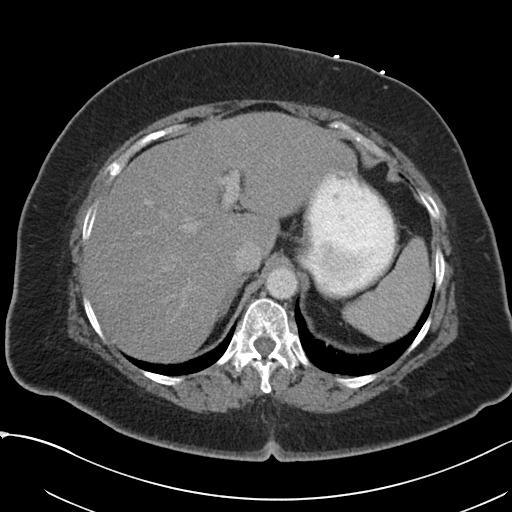
[im 73/101  bone]
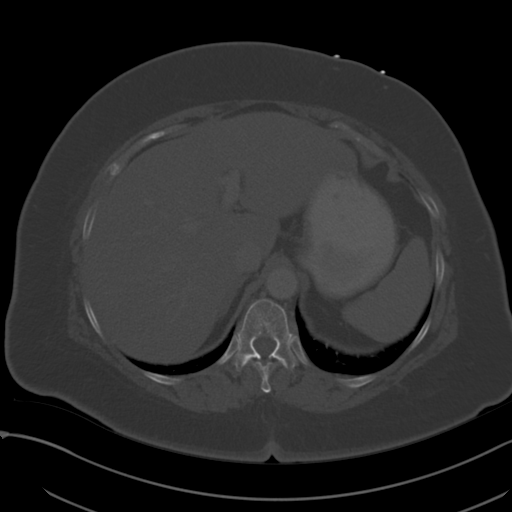
[im 78/101  soft-tissue]
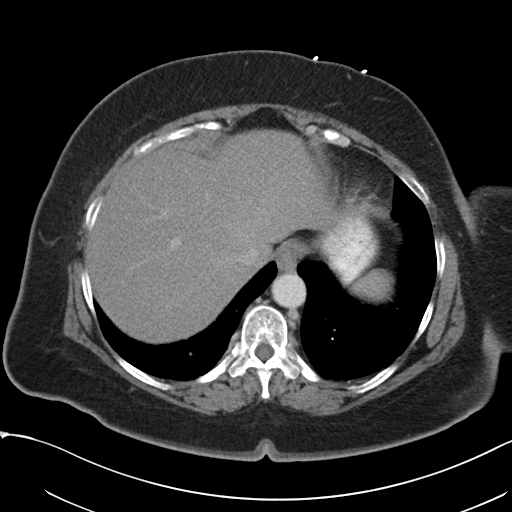
[im 84/101  soft-tissue]
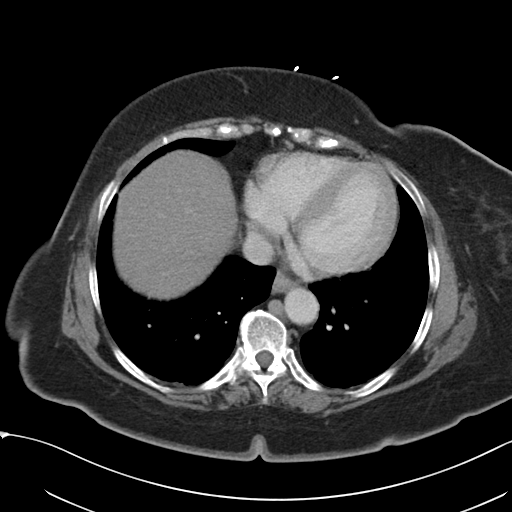
[im 95/101  soft-tissue]
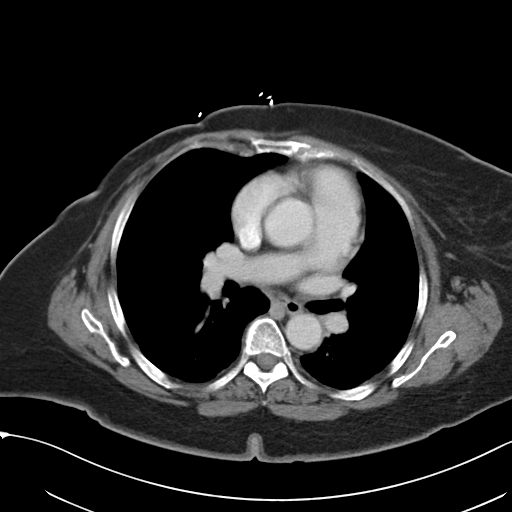

[Series 5: coronals · coronal · 0.76mm/px · 3 of 144 slices shown]
[im 48/144  soft-tissue]
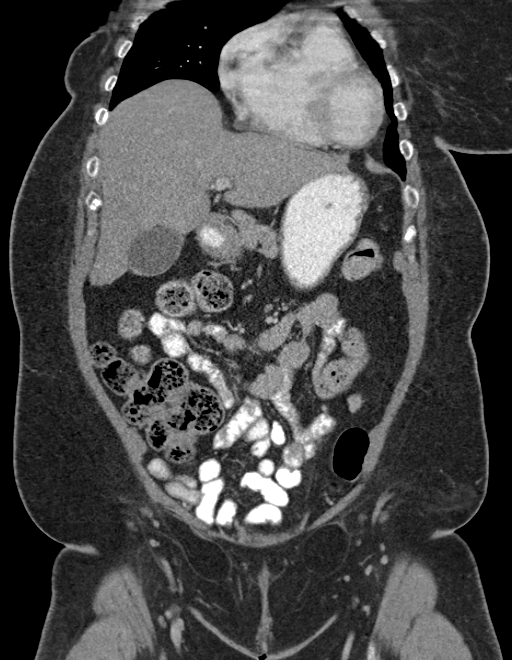
[im 64/144  soft-tissue]
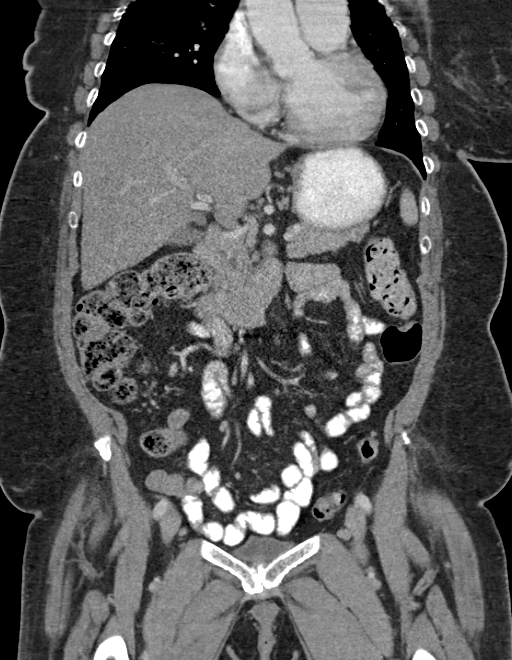
[im 80/144  soft-tissue]
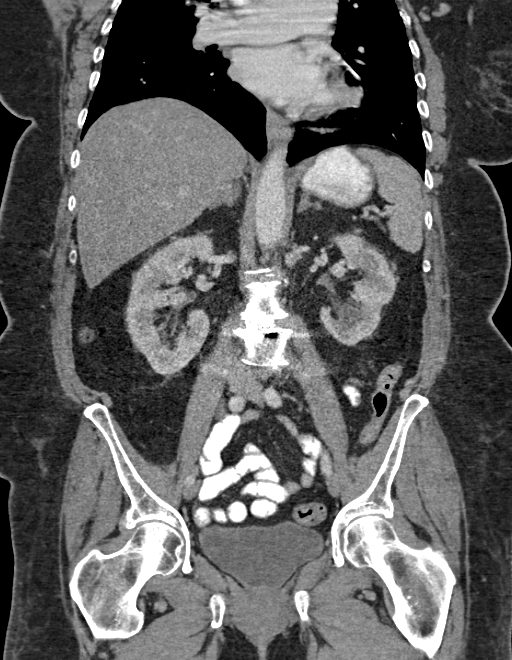

[15 of 46 positions shown; findings below may reference images not displayed]

FINDINGS: The visualized lung bases are clear. The patient is status post
right-sided mastectomy. Mild soft tissue inflammation is noted along
the anterior mid chest wall.

A small calcified granuloma is noted at the left hepatic lobe. The
liver and spleen are otherwise unremarkable. Mildly increased
attenuation dependently within the gallbladder may reflect tiny
stones. The pancreas and adrenal glands are unremarkable.

A 3 mm nonobstructing stone is noted at the interpole region of the
right kidney. A 7 mm parenchymal calcification is noted at the left
kidney. An apparent 1.7 cm parapelvic cyst is noted at the lower
pole of the left kidney. Mild nonspecific perinephric stranding is
noted bilaterally. There is no evidence of hydronephrosis. No
obstructing ureteral stones are seen.

No free fluid is identified. The small bowel is unremarkable in
appearance. The stomach is within normal limits. No acute vascular
abnormalities are seen. Minimal calcification is noted along the
abdominal aorta.

A small umbilical hernia is seen, containing only fat.

The appendix is normal in caliber, without evidence for
appendicitis. A single diverticulum is noted along the descending
colon. The colon is unremarkable in appearance.

The bladder is mildly distended and grossly unremarkable. The
patient is status post hysterectomy. No suspicious adnexal masses
are seen. No inguinal lymphadenopathy is seen. Small to moderate
bilateral inguinal hernias are seen, containing only fat.

Postoperative soft tissue stranding is noted along the mid lower
back.

No acute osseous abnormalities are identified. Transitional
lumbosacral anatomy is again noted. The patient is status post
lumbar sacral spinal fusion at L2-S1, with underlying decompression.
Mild grade 1 retrolisthesis of L3 on L4 and grade 1 anterolisthesis
of L4 on L5 is stable from prior studies.
IMPRESSION: 1. No acute abnormality seen to explain the patient's symptoms.
2. Mild soft tissue inflammation noted along the anterior mid chest
wall.
3. 3 mm nonobstructing stone at the interpole region of the right
kidney. Small left parapelvic renal cyst again noted.
4. Small umbilical hernia, containing only fat.
5. Small to moderate bilateral inguinal hernias, containing only
fat.
6. Postoperative change at the lumbar spine.

## 2017-01-10 ENCOUNTER — Ambulatory Visit (INDEPENDENT_AMBULATORY_CARE_PROVIDER_SITE_OTHER): Payer: Medicare HMO | Admitting: *Deleted

## 2017-01-10 DIAGNOSIS — Z23 Encounter for immunization: Secondary | ICD-10-CM | POA: Diagnosis not present

## 2017-01-24 ENCOUNTER — Encounter (INDEPENDENT_AMBULATORY_CARE_PROVIDER_SITE_OTHER): Payer: Self-pay | Admitting: Specialist

## 2017-01-24 ENCOUNTER — Ambulatory Visit (INDEPENDENT_AMBULATORY_CARE_PROVIDER_SITE_OTHER): Payer: Medicare HMO

## 2017-01-24 ENCOUNTER — Ambulatory Visit (INDEPENDENT_AMBULATORY_CARE_PROVIDER_SITE_OTHER): Payer: Medicare HMO | Admitting: Specialist

## 2017-01-24 VITALS — BP 157/85 | HR 69 | Ht 65.0 in | Wt 209.0 lb

## 2017-01-24 DIAGNOSIS — M4722 Other spondylosis with radiculopathy, cervical region: Secondary | ICD-10-CM | POA: Diagnosis not present

## 2017-01-24 DIAGNOSIS — M4322 Fusion of spine, cervical region: Secondary | ICD-10-CM

## 2017-01-24 DIAGNOSIS — E039 Hypothyroidism, unspecified: Secondary | ICD-10-CM | POA: Diagnosis not present

## 2017-01-24 DIAGNOSIS — M542 Cervicalgia: Secondary | ICD-10-CM

## 2017-01-24 DIAGNOSIS — I10 Essential (primary) hypertension: Secondary | ICD-10-CM | POA: Diagnosis not present

## 2017-01-24 DIAGNOSIS — M25511 Pain in right shoulder: Secondary | ICD-10-CM

## 2017-01-24 DIAGNOSIS — M7541 Impingement syndrome of right shoulder: Secondary | ICD-10-CM

## 2017-01-24 DIAGNOSIS — G8929 Other chronic pain: Secondary | ICD-10-CM

## 2017-01-24 DIAGNOSIS — E1165 Type 2 diabetes mellitus with hyperglycemia: Secondary | ICD-10-CM | POA: Diagnosis not present

## 2017-01-24 MED ORDER — DICLOFENAC-MISOPROSTOL 50-0.2 MG PO TBEC: 1.0000 | DELAYED_RELEASE_TABLET | Freq: Three times a day (TID) | ORAL | 1 refills | Status: DC

## 2017-01-24 NOTE — Progress Notes (Signed)
Office Visit Note   [name]           Date of Birth: 11/11/1950           MRN: [id number] Visit Date: 01/24/2017              Requested by: Susy Frizzle, MD [address] [place], [place] [address] PCP: Susy Frizzle, MD   Assessment & Plan: Visit Diagnoses:  1. Cervicalgia   2. Chronic right shoulder pain     Plan: Pt is ordered for your shoulder and your neck. Avoid overhead lifting and overhead use of the arms. Do not lift greater than 10 lbs. Tylenol ES one every 6-8 hours for pain and inflamation. Call if you are having right knee pain and need to consider a cortisone injection into the right knee. Continue with PT for another 2-3 weeks, then a home exercise program. Avoid overhead lifting and overhead use of the arms. Do not lift greater than 5 lbs. Adjust head rest in vehicle to prevent hyperextension if rear ended. Take extra precautions to avoid falling, including use of a cane if you feel weak. Fall Prevention and Home Safety Falls cause injuries and can affect all age groups. It is possible to use preventive measures to significantly decrease the likelihood of falls. There are many simple measures which can make your home safer and prevent falls. OUTDOORS  Repair cracks and edges of walkways and driveways.  Remove high doorway thresholds.  Trim shrubbery on the main path into your home.  Have good outside lighting.  Clear walkways of tools, rocks, debris, and clutter.  Check that handrails are not broken and are securely fastened. Both sides of steps should have handrails.  Have leaves, snow, and ice cleared regularly.  Use sand or salt on walkways during winter months.  In the garage, clean up grease or oil spills. BATHROOM  Install night lights.  Install grab bars by the toilet and in the tub and shower.  Use non-skid mats or decals in the tub or shower.  Place a plastic non-slip stool in the shower to sit on, if  needed.  Keep floors dry and clean up all water on the floor immediately.  Remove soap buildup in the tub or shower on a regular basis.  Secure bath mats with non-slip, double-sided rug tape.  Remove throw rugs and tripping hazards from the floors. BEDROOMS  Install night lights.  Make sure a bedside light is easy to reach.  Do not use oversized bedding.  Keep a telephone by your bedside.  Have a firm chair with side arms to use for getting dressed.  Remove throw rugs and tripping hazards from the floor. KITCHEN  Keep handles on pots and pans turned toward the center of the stove. Use back burners when possible.  Clean up spills quickly and allow time for drying.  Avoid walking on wet floors.  Avoid hot utensils and knives.  Position shelves so they are not too high or low.  Place commonly used objects within easy reach.  If necessary, use a sturdy step stool with a grab bar when reaching.  Keep electrical cables out of the way.  Do not use floor polish or wax that makes floors slippery. If you must use wax, use non-skid floor wax.  Remove throw rugs and tripping hazards from the floor. STAIRWAYS  Never leave objects on stairs.  Place handrails on both sides of stairways and use  them. Fix any loose handrails. Make sure handrails on both sides of the stairways are as long as the stairs.  Check carpeting to make sure it is firmly attached along stairs. Make repairs to worn or loose carpet promptly.  Avoid placing throw rugs at the top or bottom of stairways, or properly secure the rug with carpet tape to prevent slippage. Get rid of throw rugs, if possible.  Have an electrician put in a light switch at the top and bottom of the stairs. OTHER FALL PREVENTION TIPS  Wear low-heel or rubber-soled shoes that are supportive and fit well. Wear closed toe shoes.  When using a stepladder, make sure it is fully opened and both spreaders are firmly locked. Do not climb a  closed stepladder.  Add color or contrast paint or tape to grab bars and handrails in your home. Place contrasting color strips on first and last steps.  Learn and use mobility aids as needed. Install an electrical emergency response system.  Turn on lights to avoid dark areas. Replace light bulbs that burn out immediately. Get light switches that glow.  Arrange furniture to create clear pathways. Keep furniture in the same place.  Firmly attach carpet with non-skid or double-sided tape.  Eliminate uneven floor surfaces.  Select a carpet pattern that does not visually hide the edge of steps.  Be aware of all pets. OTHER HOME SAFETY TIPS  Set the water temperature for 120 F (48.8 C).  Keep emergency numbers on or near the telephone.  Keep smoke detectors on every level of the home and near sleeping areas. Document Released: 03/18/2002 Document Revised: 09/27/2011 Document Reviewed: 06/17/2011 [facility] Patient Information 2014 [place]. Follow-Up Instructions: No Follow-up on file.   Orders:  Orders Placed This Encounter  Procedures  . XR Cervical Spine 2 or 3 views  . XR Shoulder Right   No orders of the defined types were placed in this encounter.     Procedures: No procedures performed   Clinical Data: No additional findings.   Subjective: Chief Complaint  Patient presents with  . Neck - Pain  . Right Shoulder - Pain    66 year old female with right shoulder pain especially at night with lying on the right side. She also has pain radiating into the right hand all the fingers and into the right lateral upper arm and top of the left hand. She is dropping items with the right hand over the several months but not all the time. Her writing is changing and she is using more abbreviations. Feeling like something is pulling on the arm. Raising the right arm with pain in the right shoulder    Review of Systems  HENT: Positive for rhinorrhea, sinus pain,  sinus pressure, sneezing and voice change.   Eyes: Negative.  Negative for photophobia, pain, discharge, redness, itching and visual disturbance.  Respiratory: Positive for cough, shortness of breath and wheezing. Negative for chest tightness.   Cardiovascular: Negative.  Negative for chest pain, palpitations and leg swelling.  Gastrointestinal: Negative.  Negative for abdominal distention, abdominal pain, anal bleeding, blood in stool, constipation, diarrhea, nausea and vomiting.  Endocrine: Negative.  Negative for cold intolerance.  Genitourinary: Positive for enuresis. Negative for difficulty urinating, dyspareunia and dysuria.  Musculoskeletal: Positive for neck pain and neck stiffness. Negative for back pain.  Skin: Negative.   Allergic/Immunologic: Negative.  Negative for environmental allergies.  Neurological: Positive for weakness and numbness.  Hematological: Negative.  Negative for adenopathy. Does not  bruise/bleed easily.  Psychiatric/Behavioral: Negative.      Objective: Vital Signs: BP (!) 157/85 (BP Location: Left Arm, Patient Position: Sitting)   Pulse 69   Ht 5\' 5"  (1.651 m)   Wt 209 lb (94.8 kg)   BMI 34.78 kg/m   Physical Exam  Constitutional: She is oriented to person, place, and time. She appears well-developed and well-nourished.  HENT:  Head: Normocephalic and atraumatic.  Eyes: Pupils are equal, round, and reactive to light. EOM are normal.  Neck: Normal range of motion. Neck supple.  Pulmonary/Chest: Effort normal and breath sounds normal.  Abdominal: Soft. Bowel sounds are normal.  Neurological: She is alert and oriented to person, place, and time.  Skin: Skin is warm and dry.  Psychiatric: She has a normal mood and affect. Her behavior is normal. Judgment and thought content normal.    Back Exam   Tenderness  The patient is experiencing tenderness in the cervical.  Range of Motion  Extension:  60 abnormal  Flexion: 70  Lateral Bend Right:  50  abnormal  Lateral Bend Left:  70 abnormal  Rotation Right:  50 abnormal  Rotation Left:  70 abnormal   Muscle Strength  Right Quadriceps:  5/5  Left Quadriceps:  5/5  Right Hamstrings:  5/5  Left Hamstrings:  5/5   Tests  Straight leg raise right: negative Straight leg raise left: negative  Reflexes  Biceps: 2/4 Babinski's sign: normal   Other  Toe Walk: normal Heel Walk: normal Sensation: normal Gait: normal  Erythema: no back redness Scars: present  Comments:  Weak in right triceps, right finger extensors. 4/5   Right Shoulder Exam   Tenderness  The patient is experiencing tenderness in the biceps tendon and acromion.  Range of Motion  Active Abduction:  140 abnormal  Passive Abduction:  130 abnormal  Extension: 40  Forward Flexion: 150  External Rotation: 70  Internal Rotation 0 degrees: T5  Internal Rotation 90 degrees: 70   Muscle Strength  Abduction: 4/5  Internal Rotation: 5/5  External Rotation: 5/5  Supraspinatus: 4/5  Subscapularis: 5/5  Biceps: 5/5   Tests  Apprehension: positive Cross Arm: negative Drop Arm: negative Hawkin's test: negative Impingement: positive Sulcus: absent  Other  Erythema: absent Scars: absent Sensation: normal Pulse: present      Specialty Comments:  No specialty comments available.  Imaging: No results found.   PMFS History: Patient Active Problem List   Diagnosis Date Noted  . HNP (herniated nucleus pulposus), lumbar 08/22/2014    Priority: High    Class: Chronic  . Herniated nucleus pulposus, L2-3 right 02/04/2014    Priority: High    Class: Acute  . Lumbar stenosis with neurogenic claudication 04/30/2012    Priority: High    Class: Chronic  . Chronic constipation 06/23/2015  . Obesity 06/12/2015  . Chronic diastolic heart failure ([place]) 11/17/2014  . Urinary retention 05/04/2012    Class: Temporary  . Spondylolisthesis of lumbar region 04/30/2012    Class: Chronic  . Chest pain  03/20/2012  . Right shoulder pain 12/07/2010  . ABDOMINAL PAIN, LOWER 09/21/2009  . BACK PAIN 03/18/2009  . SYNCOPE-CAROTID SINUS 09/16/2008  . Essential hypertension 08/05/2008  . Hypothyroidism 08/23/2006  . Diabetes with neurologic complications ([place]) 33/00/7622  . Diabetic neuropathy ([place]) 08/23/2006  . CARDIOMYOPATHY 08/23/2006  . GERD 08/23/2006  . OSTEOARTHRITIS 08/23/2006  . DDD (degenerative disc disease), cervical 08/23/2006  . [place] DISEASE, LUMBAR 08/23/2006  . Headache(784.0) 08/23/2006  . BREAST CANCER,  HX OF 08/23/2006  . HYPERPARATHYROIDISM, HX OF 08/23/2006  . RENAL CALCULUS, HX OF 08/23/2006   Past Medical History:  Diagnosis Date  . Anginal pain ([place]) 03/19/2012   saw Dr. Cathie Olden .. she thinks its more related to stomach issues  . Arthritis    "all over; mostly in my back" (03/20/2012)  . Breast cancer ([place])    "right" (03/20/2012)  . Cardiomyopathy   . Chest pain 06/2005   Hospitalized, dystolic dysfunction  . Chronic lower back pain    "have 2 herniated discs; going to have to have a fusion" (03/20/2012)  . Family history of anesthesia complication    "father has nausea and vomiting"  . GERD (gastroesophageal reflux disease)   . H/O hiatal hernia   . History of kidney stones   . History of right mastectomy   . Hypertension    "patient states never had HTN, takes Hyzaar for heart  . Hypothyroidism   . Irritable bowel syndrome   . Migraines    "it's been a long time since I've had one" (03/20/2012)  . Osteoarthritis   . Peripheral neuropathy   . PONV (postoperative nausea and vomiting)   . Sciatica   . Type II diabetes mellitus ([facility])     Family History  Problem Relation Age of Onset  . Hypertension Mother   . Diabetes Father   . Hypertension Father   . Hypertension Sister   . Hypertension Brother   . Hypertension Brother   . Diabetes Brother   . Diabetes Other        Family Hx. of Diabetes  . Hypertension Other        Family History of HTN     Past Surgical History:  Procedure Laterality Date  . ABDOMINAL HYSTERECTOMY  1993  . adenosin cardiolite  07/2005   (+) wall motion abnormality  . AXILLARY LYMPH NODE DISSECTION  2003   squamous cell cancer  . BACK SURGERY    . CARDIAC CATHETERIZATION  ? 2005 / 2007  . CT abdomen and pelvis  02/2010   Same  . ESOPHAGOGASTRODUODENOSCOPY  2005   GERD  . KNEE ARTHROSCOPY  1990's   "right" (03/20/2012)  . LUMBAR FUSION N/A 08/22/2014   Procedure: Right L2-3 and right L1-2 transforaminal lumbar interbody fusion with pedicle screws, rods, sleeves, cages, local bone graft, Vivigen, cancellous chips;  Surgeon: Jessy Oto, MD;  Location: [facility];  Service: Orthopedics;  Laterality: N/A;  . LUMBAR LAMINECTOMY/DECOMPRESSION MICRODISCECTOMY N/A 02/04/2014   Procedure: RIGHT L2-3 MICRODISCECTOMY ;  Surgeon: Jessy Oto, MD;  Location: [facility];  Service: Orthopedics;  Laterality: N/A;  . MASTECTOMY WITH AXILLARY LYMPH NODE DISSECTION  12/2003   "right" (03/20/2012)  . POSTERIOR CERVICAL FUSION/FORAMINOTOMY  2001  . SHOULDER ARTHROSCOPY W/ ROTATOR CUFF REPAIR  2003   Left  . THYROIDECTOMY  1977   Right  . TOTAL KNEE ARTHROPLASTY  2000   Right  . [place] of abdomen  07/2005   Fatty liver / kidney stones   Social History   Occupational History  . Retired since 2003    Social History Main Topics  . Smoking status: Never Smoker  . Smokeless tobacco: Never Used  . Alcohol use No  . Drug use: No  . Sexual activity: Not on file

## 2017-01-24 NOTE — Patient Instructions (Addendum)
Avoid overhead lifting and overhead use of the arms. Do not lift greater than 10 lbs. Tylenol ES one every 6-8 hours for pain and inflamation. Call if you are having right knee pain and need to consider a cortisone injection into the right knee. Continue with PT for another 2-3 weeks, then a home exercise program. Avoid overhead lifting and overhead use of the arms. Do not lift greater than 5 lbs. Adjust head rest in vehicle to prevent hyperextension if rear ended. Take extra precautions to avoid falling, including use of a cane if you feel weak. Fall Prevention and Home Safety Falls cause injuries and can affect all age groups. It is possible to use preventive measures to significantly decrease the likelihood of falls. There are many simple measures which can make your home safer and prevent falls. OUTDOORS  Repair cracks and edges of walkways and driveways.  Remove high doorway thresholds.  Trim shrubbery on the main path into your home.  Have good outside lighting.  Clear walkways of tools, rocks, debris, and clutter.  Check that handrails are not broken and are securely fastened. Both sides of steps should have handrails.  Have leaves, snow, and ice cleared regularly.  Use sand or salt on walkways during winter months.  In the garage, clean up grease or oil spills. BATHROOM  Install night lights.  Install grab bars by the toilet and in the tub and shower.  Use non-skid mats or decals in the tub or shower.  Place a plastic non-slip stool in the shower to sit on, if needed.  Keep floors dry and clean up all water on the floor immediately.  Remove soap buildup in the tub or shower on a regular basis.  Secure bath mats with non-slip, double-sided rug tape.  Remove throw rugs and tripping hazards from the floors. BEDROOMS  Install night lights.  Make sure a bedside light is easy to reach.  Do not use oversized bedding.  Keep a telephone by your bedside.  Have a  firm chair with side arms to use for getting dressed.  Remove throw rugs and tripping hazards from the floor. KITCHEN  Keep handles on pots and pans turned toward the center of the stove. Use back burners when possible.  Clean up spills quickly and allow time for drying.  Avoid walking on wet floors.  Avoid hot utensils and knives.  Position shelves so they are not too high or low.  Place commonly used objects within easy reach.  If necessary, use a sturdy step stool with a grab bar when reaching.  Keep electrical cables out of the way.  Do not use floor polish or wax that makes floors slippery. If you must use wax, use non-skid floor wax.  Remove throw rugs and tripping hazards from the floor. STAIRWAYS  Never leave objects on stairs.  Place handrails on both sides of stairways and use them. Fix any loose handrails. Make sure handrails on both sides of the stairways are as long as the stairs.  Check carpeting to make sure it is firmly attached along stairs. Make repairs to worn or loose carpet promptly.  Avoid placing throw rugs at the top or bottom of stairways, or properly secure the rug with carpet tape to prevent slippage. Get rid of throw rugs, if possible.  Have an electrician put in a light switch at the top and bottom of the stairs. OTHER FALL PREVENTION TIPS  Wear low-heel or rubber-soled shoes that are supportive and fit well.  Wear closed toe shoes.  When using a stepladder, make sure it is fully opened and both spreaders are firmly locked. Do not climb a closed stepladder.  Add color or contrast paint or tape to grab bars and handrails in your home. Place contrasting color strips on first and last steps.  Learn and use mobility aids as needed. Install an electrical emergency response system.  Turn on lights to avoid dark areas. Replace light bulbs that burn out immediately. Get light switches that glow.  Arrange furniture to create clear pathways. Keep  furniture in the same place.  Firmly attach carpet with non-skid or double-sided tape.  Eliminate uneven floor surfaces.  Select a carpet pattern that does not visually hide the edge of steps.  Be aware of all pets. OTHER HOME SAFETY TIPS  Set the water temperature for 120 F (48.8 C).  Keep emergency numbers on or near the telephone.  Keep smoke detectors on every level of the home and near sleeping areas. Document Released: 03/18/2002 Document Revised: 09/27/2011 Document Reviewed: 06/17/2011 [facility] Patient Information 2014 [place].

## 2017-02-09 DIAGNOSIS — H524 Presbyopia: Secondary | ICD-10-CM | POA: Diagnosis not present

## 2017-02-09 DIAGNOSIS — H26493 Other secondary cataract, bilateral: Secondary | ICD-10-CM | POA: Diagnosis not present

## 2017-02-09 DIAGNOSIS — E119 Type 2 diabetes mellitus without complications: Secondary | ICD-10-CM | POA: Diagnosis not present

## 2017-02-11 IMAGING — CT CT L SPINE W/O CM
1 of 5 series · 6 of 14 positions shown, 8 images · non-contrast
Comparison: Abdominal pelvic CT 09/14/2014, intraoperative
radiographs 08/22/2014 and lumbar MRI 06/02/2014.

CLINICAL DATA: Low back and bilateral leg pain (worse on the left)
and right leg numbness. History of back surgery x3, most recently 2
months ago. History of right-sided breast cancer. Question L2
compression fracture.

EXAM:
CT LUMBAR SPINE WITHOUT CONTRAST
TECHNIQUE: Multidetector CT imaging of the lumbar spine was performed without
intravenous contrast administration. Multiplanar CT image
reconstructions were also generated.

[Series 3: l spine soft · axial · 0.27mm/px · z∈[-286,-124]mm · 6 of 76 slices shown, 8 images]
[im 11/76  soft-tissue]
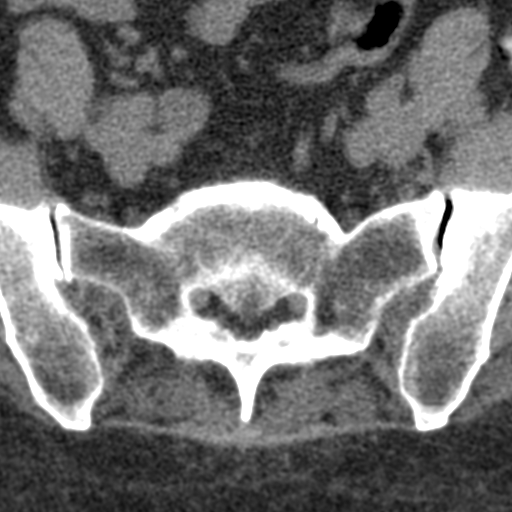
[im 11/76  bone]
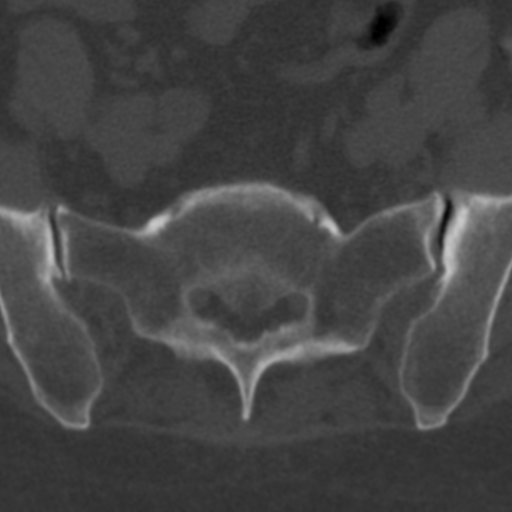
[im 22/76  bone]
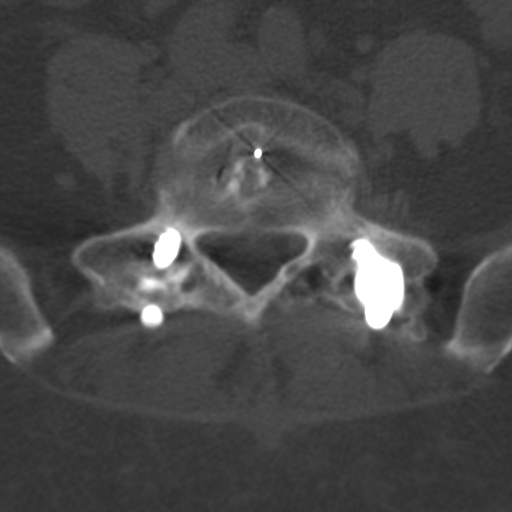
[im 33/76  bone]
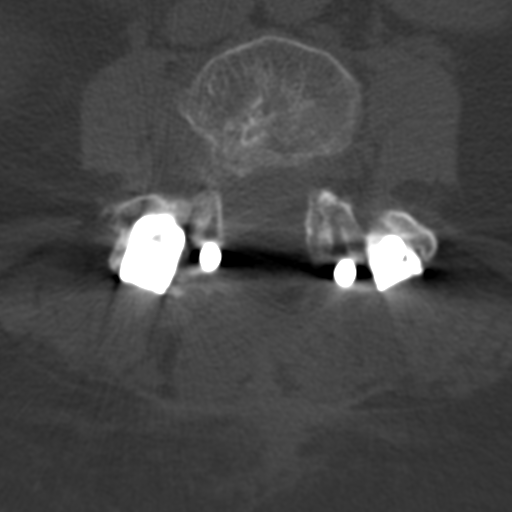
[im 43/76  bone]
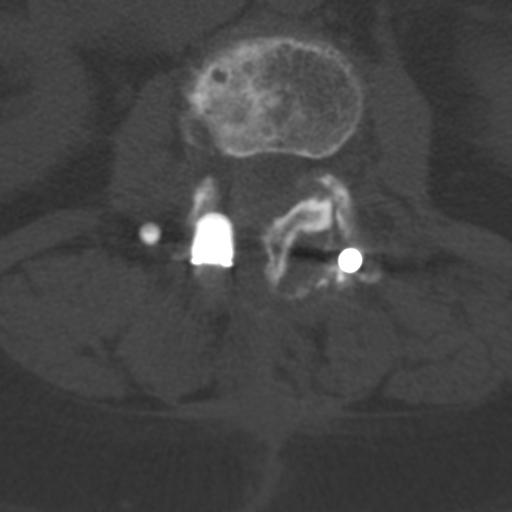
[im 54/76  soft-tissue]
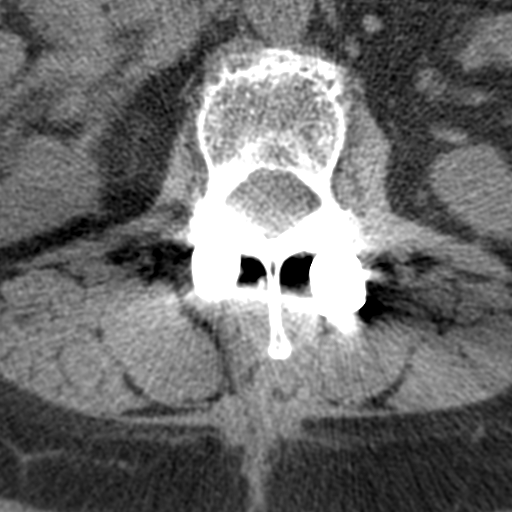
[im 54/76  bone]
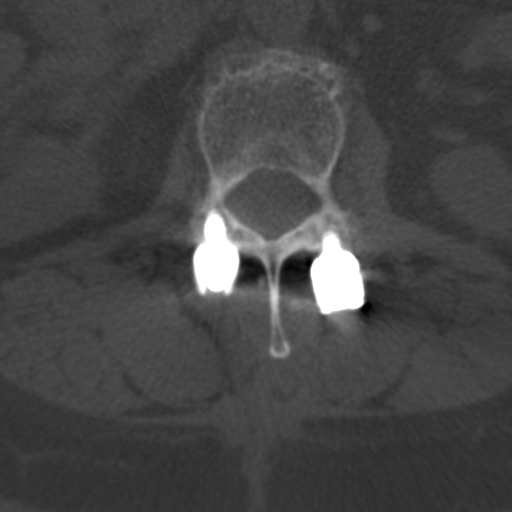
[im 65/76  bone]
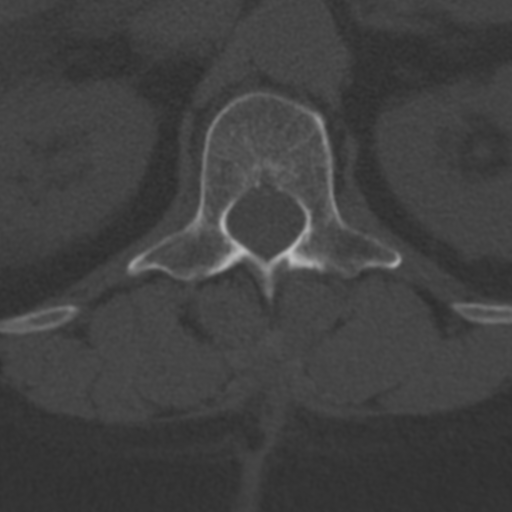

[6 of 14 positions shown; findings below may reference images not displayed]

FINDINGS: There is transitional lumbosacral anatomy. In keeping with prior
cross-sectional imaging, the transitional lumbosacral segment is
assigned S1. Labeling has varied on some other prior studies.
Patient has undergone pedicle screw and interbody fusion from L2
through S1. There is a mild superior endplate compression deformity
at L2 resulting in less than 20% loss of vertebral body height. Both
L2 pedicle screws protrude through the superior endplate of L2. The
right L3 pedicle screw also protrudes through the superior endplate.
The pre-existing lower hardware is unchanged and intact. Solid
interbody fusion is present from L3-4 through L5-S1. No definite
solid interbody fusion seen at L2-3.

No significant disc space findings at T12-L1 or L1-2.

L2-3: Status post right laminectomy and PLIF. No residual spinal
stenosis or nerve root encroachment.

L3-4: Interval right laminectomy and discectomy with interbody
fusion. There is adequate decompression of the spinal canal. No
significant foraminal compromise or nerve root encroachment.

L4-5: Stable remote postsurgical changes with laminectomy and solid
interbody fusion. The spinal canal and neural foramina are
well-decompressed.

L5-S1: Stable remote postsurgical changes with laminectomy and solid
interbody fusion. Posterior osteophytes on the right are unchanged
without resulting foraminal compromise.

S1-2: Vestigial disc space with bilateral facet hypertrophy. No
spinal stenosis. Bilateral sacroiliac degenerative changes noted.
IMPRESSION: 1. Transitional lumbosacral anatomy. The numbering for this
examination is in keeping with prior cross-sectional studies,
assigning the transitional segment S1. Fusion has been performed
from L2 through S1.
2. Mild superior endplate compression deformity at L2. Both L2
pedicle screws protrude through the superior endplate of L2. The
right L3 pedicle screw also protrudes through the superior endplate.
No solid interbody fusion identified at L2-3.
3. Interbody fusion appears solid from L3-4 through L5-S1.
4. No recurrent spinal stenosis or nerve root encroachment.

## 2017-02-17 DIAGNOSIS — H26491 Other secondary cataract, right eye: Secondary | ICD-10-CM | POA: Diagnosis not present

## 2017-02-17 DIAGNOSIS — Z961 Presence of intraocular lens: Secondary | ICD-10-CM | POA: Diagnosis not present

## 2017-02-27 ENCOUNTER — Ambulatory Visit (INDEPENDENT_AMBULATORY_CARE_PROVIDER_SITE_OTHER): Payer: Medicare HMO | Admitting: Specialist

## 2017-02-27 ENCOUNTER — Encounter (INDEPENDENT_AMBULATORY_CARE_PROVIDER_SITE_OTHER): Payer: Self-pay | Admitting: Specialist

## 2017-02-27 VITALS — BP 151/85 | HR 76 | Ht 65.0 in | Wt 209.0 lb

## 2017-02-27 DIAGNOSIS — M7581 Other shoulder lesions, right shoulder: Secondary | ICD-10-CM

## 2017-02-27 DIAGNOSIS — M47812 Spondylosis without myelopathy or radiculopathy, cervical region: Secondary | ICD-10-CM

## 2017-02-27 DIAGNOSIS — M778 Other enthesopathies, not elsewhere classified: Secondary | ICD-10-CM

## 2017-02-27 MED ORDER — METHYLPREDNISOLONE ACETATE 40 MG/ML IJ SUSP
40.0000 mg | INTRAMUSCULAR | Status: AC | PRN
  Administered 2017-02-27: 40 mg via INTRA_ARTICULAR

## 2017-02-27 MED ORDER — BUPIVACAINE HCL 0.25 % IJ SOLN
4.0000 mL | INTRAMUSCULAR | Status: AC | PRN
  Administered 2017-02-27: 4 mL via INTRA_ARTICULAR

## 2017-02-27 NOTE — Patient Instructions (Signed)
Avoid overhead lifting and overhead use of the arms. Do not lift greater than 10 lbs. Tylenol ES one every 6-8 hours for pain and inflamation. Call if you are having right knee pain and need to consider a cortisone injection into the right knee. Continue with a home exercise program.

## 2017-02-27 NOTE — Progress Notes (Signed)
Office Visit Note   [name]           Date of Birth: 1950-05-11           MRN: [id number] Visit Date: 02/27/2017              Requested by: Susy Frizzle, MD [address] [place], [place] [address] PCP: Susy Frizzle, MD   Assessment & Plan: Visit Diagnoses:  1. Right shoulder tendonitis   2. Spondylosis without myelopathy or radiculopathy, cervical region     Plan: Avoid overhead lifting and overhead use of the arms. Do not lift greater than 10 lbs. Tylenol ES one every 6-8 hours for pain and inflamation. Call if you are having right knee pain and need to consider a cortisone injection into the right knee. Continue with a home exercise program.  Follow-Up Instructions: Return in about 6 weeks (around 04/10/2017).   Orders:  Orders Placed This Encounter  Procedures  . Large Joint Inj   No orders of the defined types were placed in this encounter.     Procedures: Large Joint Inj: R subacromial bursa on 02/27/2017 5:00 PM Indications: pain Details: 25 G 1.5 in needle, posterior approach  Arthrogram: No  Medications: 40 mg methylPREDNISolone acetate 40 MG/ML; 4 mL bupivacaine 0.25 % Outcome: tolerated well, no immediate complications  bandaid applied.  Procedure, treatment alternatives, risks and benefits explained, specific risks discussed. Consent was given by the patient. Immediately prior to procedure a time out was called to verify the correct patient, procedure, equipment, support staff and site/side marked as required. Patient was prepped and draped in the usual sterile fashion.       Clinical Data: No additional findings.   Subjective: Chief Complaint  Patient presents with  . Neck - Follow-up  . Right Shoulder - Follow-up    66 year old right female seen last month with shoulder pain and was started on exercises and she has pain mainly when she goes to bed at night. Not an all day every day thing. She does drop a  lot of items. No bowel or bladder difficulties. She has urgency to pass her water, has to get there fast. She is experiencing tingling in the right hand and is dropping items with the right hand especially in the kitchen.     Review of Systems  Constitutional: Positive for activity change, appetite change and unexpected weight change. Negative for chills, diaphoresis, fatigue and fever.  HENT: Negative for congestion, nosebleeds, postnasal drip, rhinorrhea, sinus pressure, sinus pain and sneezing.   Eyes: Positive for visual disturbance. Negative for pain, discharge, redness and itching.  Respiratory: Positive for cough and wheezing. Negative for shortness of breath.   Cardiovascular: Negative.   Gastrointestinal: Positive for nausea. Negative for abdominal distention, abdominal pain, anal bleeding, blood in stool, constipation and diarrhea.  Endocrine: Negative.   Genitourinary: Positive for difficulty urinating and urgency. Negative for dyspareunia, dysuria, enuresis, flank pain and frequency.  Musculoskeletal: Positive for arthralgias, back pain, gait problem, neck pain and neck stiffness.  Skin: Negative.  Negative for color change, pallor, rash and wound.  Allergic/Immunologic: Negative.   Neurological: Positive for weakness and numbness. Negative for dizziness, facial asymmetry and headaches.  Hematological: Negative.   Psychiatric/Behavioral: Negative.  Negative for agitation, behavioral problems, confusion, decreased concentration, dysphoric mood, hallucinations, self-injury, sleep disturbance and suicidal ideas. The patient is not nervous/anxious and is not hyperactive.      Objective: Vital Signs:  BP (!) 151/85 (BP Location: Left Arm, Patient Position: Sitting)   Pulse 76   Ht 5\' 5"  (1.651 m)   Wt 209 lb (94.8 kg)   BMI 34.78 kg/m   Physical Exam  Constitutional: She is oriented to person, place, and time. She appears well-developed and well-nourished.  HENT:  Head:  Normocephalic and atraumatic.  Eyes: EOM are normal. Pupils are equal, round, and reactive to light.  Neck: Normal range of motion. Neck supple.  Pulmonary/Chest: Effort normal and breath sounds normal.  Abdominal: Soft. Bowel sounds are normal.  Neurological: She is alert and oriented to person, place, and time.  Skin: Skin is warm and dry.  Psychiatric: She has a normal mood and affect. Her behavior is normal. Judgment and thought content normal.    Back Exam   Tenderness  The patient is experiencing tenderness in the cervical.  Range of Motion  Extension:  40 abnormal  Flexion: 40  Lateral bend right: 30  Lateral bend left: 30  Rotation right: 40  Rotation left: 40   Muscle Strength  The patient has normal back strength. Right Quadriceps:  5/5  Left Quadriceps:  5/5  Right Hamstrings:  5/5  Left Hamstrings:  5/5   Tests  Straight leg raise right: negative Straight leg raise left: negative  Reflexes  Patellar:  Hyporeflexic normal Achilles:  Hyporeflexic normal Babinski's sign: normal   Other  Toe walk: normal Heel walk: normal Sensation: normal Gait: normal  Erythema: no back redness Scars: absent   Right Shoulder Exam  Right shoulder exam is normal.  Tenderness  The patient is experiencing tenderness in the biceps tendon and acromion.  Range of Motion  Active abduction: 150  Passive abduction: 150  External rotation: 70  Forward flexion: 90   Muscle Strength  Abduction: 4/5  Internal rotation: 5/5  External rotation: 5/5  Supraspinatus: 4/5  Subscapularis: 5/5  Biceps: 5/5   Tests  Impingement: positive  Other  Erythema: absent Scars: absent Sensation: normal Pulse: present   Left Shoulder Exam  Left shoulder exam is normal.      Specialty Comments:  No specialty comments available.  Imaging: No results found.   PMFS History: Patient Active Problem List   Diagnosis Date Noted  . HNP (herniated nucleus pulposus), lumbar  08/22/2014    Priority: High    Class: Chronic  . Herniated nucleus pulposus, L2-3 right 02/04/2014    Priority: High    Class: Acute  . Lumbar stenosis with neurogenic claudication 04/30/2012    Priority: High    Class: Chronic  . Chronic constipation 06/23/2015  . Obesity 06/12/2015  . Chronic diastolic heart failure ([place]) 11/17/2014  . Urinary retention 05/04/2012    Class: Temporary  . Spondylolisthesis of lumbar region 04/30/2012    Class: Chronic  . Chest pain 03/20/2012  . Right shoulder pain 12/07/2010  . ABDOMINAL PAIN, LOWER 09/21/2009  . BACK PAIN 03/18/2009  . SYNCOPE-CAROTID SINUS 09/16/2008  . Essential hypertension 08/05/2008  . Hypothyroidism 08/23/2006  . Diabetes with neurologic complications ([place]) 24/40/1027  . Diabetic neuropathy ([place]) 08/23/2006  . CARDIOMYOPATHY 08/23/2006  . GERD 08/23/2006  . OSTEOARTHRITIS 08/23/2006  . DDD (degenerative disc disease), cervical 08/23/2006  . [place] DISEASE, LUMBAR 08/23/2006  . Headache(784.0) 08/23/2006  . BREAST CANCER, HX OF 08/23/2006  . HYPERPARATHYROIDISM, HX OF 08/23/2006  . RENAL CALCULUS, HX OF 08/23/2006   Past Medical History:  Diagnosis Date  . Anginal pain ([place]) 03/19/2012   saw Dr. Cathie Olden .[name] Kitchen  she thinks its more related to stomach issues  . Arthritis    "all over; mostly in my back" (03/20/2012)  . Breast cancer ([place])    "right" (03/20/2012)  . Cardiomyopathy   . Chest pain 06/2005   Hospitalized, dystolic dysfunction  . Chronic lower back pain    "have 2 herniated discs; going to have to have a fusion" (03/20/2012)  . Family history of anesthesia complication    "father has nausea and vomiting"  . GERD (gastroesophageal reflux disease)   . H/O hiatal hernia   . History of kidney stones   . History of right mastectomy   . Hypertension    "patient states never had HTN, takes Hyzaar for heart  . Hypothyroidism   . Irritable bowel syndrome   . Migraines    "it's been a long time since I've had  one" (03/20/2012)  . Osteoarthritis   . Peripheral neuropathy   . PONV (postoperative nausea and vomiting)   . Sciatica   . Type II diabetes mellitus ([facility])     Family History  Problem Relation Age of Onset  . Hypertension Mother   . Diabetes Father   . Hypertension Father   . Hypertension Sister   . Hypertension Brother   . Hypertension Brother   . Diabetes Brother   . Diabetes Other        Family Hx. of Diabetes  . Hypertension Other        Family History of HTN    Past Surgical History:  Procedure Laterality Date  . ABDOMINAL HYSTERECTOMY  1993  . adenosin cardiolite  07/2005   (+) wall motion abnormality  . AXILLARY LYMPH NODE DISSECTION  2003   squamous cell cancer  . BACK SURGERY    . CARDIAC CATHETERIZATION  ? 2005 / 2007  . CT abdomen and pelvis  02/2010   Same  . ESOPHAGOGASTRODUODENOSCOPY  2005   GERD  . KNEE ARTHROSCOPY  1990's   "right" (03/20/2012)  . MASTECTOMY WITH AXILLARY LYMPH NODE DISSECTION  12/2003   "right" (03/20/2012)  . POSTERIOR CERVICAL FUSION/FORAMINOTOMY  2001  . POSTERIOR LUMBAR FUSION 2 LEVEL N/A 04/30/2012   Performed by Jessy Oto, MD at [facility] OR  . Right L2-3 and right L1-2 transforaminal lumbar interbody fusion with pedicle screws, rods, sleeves, cages, local bone graft, Vivigen, cancellous chips N/A 08/22/2014   Performed by Jessy Oto, MD at [facility] OR  . RIGHT L2-3 MICRODISCECTOMY N/A 02/04/2014   Performed by Jessy Oto, MD at [facility]  2003   Left  . THYROIDECTOMY  1977   Right  . TOTAL KNEE ARTHROPLASTY  2000   Right  . [place] of abdomen  07/2005   Fatty liver / kidney stones   Social History   Occupational History  . Occupation: Retired since 2003  Tobacco Use  . Smoking status: Never Smoker  . Smokeless tobacco: Never Used  Substance and Sexual Activity  . Alcohol use: No  . Drug use: No  . Sexual activity: Not on file

## 2017-03-04 ENCOUNTER — Emergency Department (HOSPITAL_BASED_OUTPATIENT_CLINIC_OR_DEPARTMENT_OTHER): Payer: Medicare HMO

## 2017-03-04 ENCOUNTER — Observation Stay (HOSPITAL_BASED_OUTPATIENT_CLINIC_OR_DEPARTMENT_OTHER)
Admission: EM | Admit: 2017-03-04 | Discharge: 2017-03-07 | Disposition: A | Discharge status: Home / Self Care | Payer: Medicare HMO | Attending: Internal Medicine | Admitting: Internal Medicine

## 2017-03-04 ENCOUNTER — Other Ambulatory Visit: Payer: Self-pay

## 2017-03-04 ENCOUNTER — Encounter (HOSPITAL_BASED_OUTPATIENT_CLINIC_OR_DEPARTMENT_OTHER): Payer: Self-pay | Admitting: Emergency Medicine

## 2017-03-04 DIAGNOSIS — Z7984 Long term (current) use of oral hypoglycemic drugs: Secondary | ICD-10-CM | POA: Insufficient documentation

## 2017-03-04 DIAGNOSIS — R748 Abnormal levels of other serum enzymes: Secondary | ICD-10-CM | POA: Insufficient documentation

## 2017-03-04 DIAGNOSIS — E1122 Type 2 diabetes mellitus with diabetic chronic kidney disease: Secondary | ICD-10-CM

## 2017-03-04 DIAGNOSIS — R778 Other specified abnormalities of plasma proteins: Secondary | ICD-10-CM

## 2017-03-04 DIAGNOSIS — Z96651 Presence of right artificial knee joint: Secondary | ICD-10-CM | POA: Insufficient documentation

## 2017-03-04 DIAGNOSIS — R079 Chest pain, unspecified: Secondary | ICD-10-CM | POA: Diagnosis not present

## 2017-03-04 DIAGNOSIS — I5032 Chronic diastolic (congestive) heart failure: Secondary | ICD-10-CM | POA: Diagnosis not present

## 2017-03-04 DIAGNOSIS — E119 Type 2 diabetes mellitus without complications: Secondary | ICD-10-CM | POA: Insufficient documentation

## 2017-03-04 DIAGNOSIS — Z7982 Long term (current) use of aspirin: Secondary | ICD-10-CM | POA: Diagnosis not present

## 2017-03-04 DIAGNOSIS — Z853 Personal history of malignant neoplasm of breast: Secondary | ICD-10-CM | POA: Insufficient documentation

## 2017-03-04 DIAGNOSIS — R7989 Other specified abnormal findings of blood chemistry: Secondary | ICD-10-CM | POA: Diagnosis not present

## 2017-03-04 DIAGNOSIS — IMO0002 Reserved for concepts with insufficient information to code with codable children: Secondary | ICD-10-CM

## 2017-03-04 DIAGNOSIS — E1165 Type 2 diabetes mellitus with hyperglycemia: Secondary | ICD-10-CM

## 2017-03-04 DIAGNOSIS — E1149 Type 2 diabetes mellitus with other diabetic neurological complication: Secondary | ICD-10-CM | POA: Diagnosis present

## 2017-03-04 DIAGNOSIS — I110 Hypertensive heart disease with heart failure: Secondary | ICD-10-CM | POA: Insufficient documentation

## 2017-03-04 DIAGNOSIS — R0789 Other chest pain: Secondary | ICD-10-CM | POA: Diagnosis not present

## 2017-03-04 DIAGNOSIS — R600 Localized edema: Secondary | ICD-10-CM

## 2017-03-04 DIAGNOSIS — E118 Type 2 diabetes mellitus with unspecified complications: Secondary | ICD-10-CM

## 2017-03-04 DIAGNOSIS — R791 Abnormal coagulation profile: Secondary | ICD-10-CM | POA: Diagnosis not present

## 2017-03-04 DIAGNOSIS — E039 Hypothyroidism, unspecified: Secondary | ICD-10-CM | POA: Diagnosis not present

## 2017-03-04 DIAGNOSIS — R0602 Shortness of breath: Secondary | ICD-10-CM | POA: Diagnosis not present

## 2017-03-04 LAB — COMPREHENSIVE METABOLIC PANEL
ALBUMIN: 4 g/dL (ref 3.5–5.0)
ALK PHOS: 67 U/L (ref 38–126)
ALT: 28 U/L (ref 14–54)
AST: 29 U/L (ref 15–41)
Anion gap: 7 (ref 5–15)
BUN: 20 mg/dL (ref 6–20)
CHLORIDE: 102 mmol/L (ref 101–111)
CO2: 28 mmol/L (ref 22–32)
Calcium: 10.7 mg/dL — ABNORMAL HIGH (ref 8.9–10.3)
Creatinine, Ser: 0.79 mg/dL (ref 0.44–1.00)
GFR calc non Af Amer: >60 mL/min (ref >60)
Glucose, Bld: 264 mg/dL — ABNORMAL HIGH (ref 65–99)
POTASSIUM: 4.4 mmol/L (ref 3.5–5.1)
Sodium: 137 mmol/L (ref 135–145)
Total Bilirubin: 0.5 mg/dL (ref 0.3–1.2)
Total Protein: 8.1 g/dL (ref 6.5–8.1)

## 2017-03-04 LAB — BRAIN NATRIURETIC PEPTIDE: B Natriuretic Peptide: 42.9 pg/mL (ref 0.0–100.0)

## 2017-03-04 LAB — LIPASE, BLOOD: LIPASE: 45 U/L (ref 11–51)

## 2017-03-04 LAB — CBC
HEMATOCRIT: 46 % (ref 36.0–46.0)
Hemoglobin: 15 g/dL (ref 12.0–15.0)
MCH: 29 pg (ref 26.0–34.0)
MCHC: 32.6 g/dL (ref 30.0–36.0)
MCV: 88.8 fL (ref 78.0–100.0)
PLATELETS: 239 10*3/uL (ref 150–400)
RBC: 5.18 MIL/uL — ABNORMAL HIGH (ref 3.87–5.11)
RDW: 13.9 % (ref 11.5–15.5)
WBC: 7.1 10*3/uL (ref 4.0–10.5)

## 2017-03-04 LAB — TROPONIN I
TROPONIN I: 0.03 ng/mL — AB (ref <0.03)
Troponin I: <0.03 ng/mL (ref <0.03)

## 2017-03-04 LAB — D-DIMER, QUANTITATIVE (NOT AT ARMC): D DIMER QUANT: 0.84 ug{FEU}/mL — AB (ref 0.00–0.50)

## 2017-03-04 MED ORDER — IOPAMIDOL (ISOVUE-370) INJECTION 76%: 100.0000 mL | Freq: Once | INTRAVENOUS | Status: DC | PRN

## 2017-03-04 MED ORDER — MORPHINE SULFATE (PF) 4 MG/ML IV SOLN
4.0000 mg | Freq: Once | INTRAVENOUS | Status: AC
  Administered 2017-03-04: 2 mg via INTRAVENOUS
  Filled 2017-03-04: qty 1

## 2017-03-04 MED ORDER — ASPIRIN 81 MG PO CHEW
324.0000 mg | CHEWABLE_TABLET | Freq: Once | ORAL | Status: AC
  Administered 2017-03-04: 324 mg via ORAL
  Filled 2017-03-04: qty 4

## 2017-03-04 MED ORDER — ONDANSETRON HCL 4 MG/2ML IJ SOLN
4.0000 mg | Freq: Once | INTRAMUSCULAR | Status: AC
  Administered 2017-03-04: 4 mg via INTRAVENOUS
  Filled 2017-03-04: qty 2

## 2017-03-04 MED ORDER — NITROGLYCERIN 0.4 MG SL SUBL
0.4000 mg | SUBLINGUAL_TABLET | SUBLINGUAL | Status: DC | PRN
  Administered 2017-03-04 (×3): 0.4 mg via SUBLINGUAL
  Filled 2017-03-04: qty 1

## 2017-03-04 NOTE — ED Notes (Addendum)
EDP at [facility]. Pt updated on results and admit/ transfer plan. Discussed IV access including EJ, IO and central line.

## 2017-03-04 NOTE — ED Notes (Signed)
Report received. Care assumed. Previously attempted IV x3, unsuccessful.

## 2017-03-04 NOTE — ED Notes (Signed)
EDP at [facility] with [place], unsuccessful x2

## 2017-03-04 NOTE — ED Notes (Signed)
Pain decreased, "feel better", blood to lab, pending results, pt ready for [place], [place] aware, ambulatory to b/r with steady gait. Supervised.

## 2017-03-04 NOTE — ED Notes (Signed)
Attempted piv left hand. Flash obtained but unable to thread catheter

## 2017-03-04 NOTE — ED Notes (Signed)
Up to b/r with family.

## 2017-03-04 NOTE — ED Notes (Addendum)
Attempted other IV x3, unsuccessful (fragile veins blow/ infiltrate).

## 2017-03-04 NOTE — ED Notes (Signed)
ED Provider at bedside. 

## 2017-03-04 NOTE — ED Notes (Signed)
EDP into room to establish new IV.

## 2017-03-04 NOTE — ED Notes (Signed)
Pt on cardiac monitor and auto VS 

## 2017-03-04 NOTE — ED Notes (Addendum)
Dr. Billy Fischer aware of troponin of 0.03

## 2017-03-04 NOTE — ED Notes (Signed)
CT to assess IV.

## 2017-03-04 NOTE — ED Triage Notes (Signed)
L side chest pain x 2 weeks radiating to her back with nausea and SOB. Sent from [facility].

## 2017-03-04 NOTE — ED Notes (Signed)
Resting comfortably in stretcher, no changes, CP 4/10, (denies: sob, nausea or other sx), IVF KVO to L FA, assessing patency, EDP aware, NSR/SB on monitor, HR 60, VSS, BP elevated. Family x2 at [facility].

## 2017-03-04 NOTE — ED Notes (Signed)
Attempted IV to LAC, unsuccessful, tol well . Pleasant, chatty with nurse

## 2017-03-04 NOTE — ED Provider Notes (Signed)
[facility] EMERGENCY DEPARTMENT Provider Note   CSN: [id number] Arrival date & time: 03/04/17  1709     History   Chief Complaint Chief Complaint  Patient presents with  . Chest Pain    HPI [name] is a 66 y.o. female.  HPI   A few weeks of chest pain, began end of October, feels like an elephant sitting on her chest.  When laying down it improves.  Worse with moving, just getting up and walking makes it worse.  Has some resting pain but it is not as severe.  10/10 pain today, and now.  Radiates to the back and pain down the right arm.  If walking has some shortness of breath.  Nausea, no vomiting.  Cough at night, no fever.  Left leg swelling more than right, for about 3 weeks.  Hx of cath 2005?  Past Medical History:  Diagnosis Date  . Anginal pain ([place]) 03/19/2012   saw Dr. Cathie Olden .. she thinks its more related to stomach issues  . Arthritis    "all over; mostly in my back" (03/20/2012)  . Breast cancer ([place])    "right" (03/20/2012)  . Cardiomyopathy   . Chest pain 06/2005   Hospitalized, dystolic dysfunction  . Chronic lower back pain    "have 2 herniated discs; going to have to have a fusion" (03/20/2012)  . Family history of anesthesia complication    "father has nausea and vomiting"  . GERD (gastroesophageal reflux disease)   . H/O hiatal hernia   . History of kidney stones   . History of right mastectomy   . Hypertension    "patient states never had HTN, takes Hyzaar for heart  . Hypothyroidism   . Irritable bowel syndrome   . Migraines    "it's been a long time since I've had one" (03/20/2012)  . Osteoarthritis   . Peripheral neuropathy   . PONV (postoperative nausea and vomiting)   . Sciatica   . Type II diabetes mellitus [facility])     Patient Active Problem List   Diagnosis Date Noted  . Chronic constipation 06/23/2015  . Obesity 06/12/2015  . Chronic diastolic heart failure ([place]) 11/17/2014  . HNP (herniated nucleus pulposus),  lumbar 08/22/2014    Class: Chronic  . Herniated nucleus pulposus, L2-3 right 02/04/2014    Class: Acute  . Urinary retention 05/04/2012    Class: Temporary  . Spondylolisthesis of lumbar region 04/30/2012    Class: Chronic  . Lumbar stenosis with neurogenic claudication 04/30/2012    Class: Chronic  . Chest pain 03/20/2012  . Right shoulder pain 12/07/2010  . ABDOMINAL PAIN, LOWER 09/21/2009  . BACK PAIN 03/18/2009  . SYNCOPE-CAROTID SINUS 09/16/2008  . Essential hypertension 08/05/2008  . Hypothyroidism 08/23/2006  . Diabetes with neurologic complications ([place]) 76/54/6503  . Diabetic neuropathy ([place]) 08/23/2006  . CARDIOMYOPATHY 08/23/2006  . GERD 08/23/2006  . OSTEOARTHRITIS 08/23/2006  . DDD (degenerative disc disease), cervical 08/23/2006  . [place] DISEASE, LUMBAR 08/23/2006  . Headache(784.0) 08/23/2006  . BREAST CANCER, HX OF 08/23/2006  . HYPERPARATHYROIDISM, HX OF 08/23/2006  . RENAL CALCULUS, HX OF 08/23/2006    Past Surgical History:  Procedure Laterality Date  . ABDOMINAL HYSTERECTOMY  1993  . adenosin cardiolite  07/2005   (+) wall motion abnormality  . AXILLARY LYMPH NODE DISSECTION  2003   squamous cell cancer  . BACK SURGERY    . CARDIAC CATHETERIZATION  ? 2005 / 2007  . CT abdomen and  pelvis  02/2010   Same  . ESOPHAGOGASTRODUODENOSCOPY  2005   GERD  . KNEE ARTHROSCOPY  1990's   "right" (03/20/2012)  . LUMBAR FUSION N/A 08/22/2014   Procedure: Right L2-3 and right L1-2 transforaminal lumbar interbody fusion with pedicle screws, rods, sleeves, cages, local bone graft, Vivigen, cancellous chips;  Surgeon: Jessy Oto, MD;  Location: [facility];  Service: Orthopedics;  Laterality: N/A;  . LUMBAR LAMINECTOMY/DECOMPRESSION MICRODISCECTOMY N/A 02/04/2014   Procedure: RIGHT L2-3 MICRODISCECTOMY ;  Surgeon: Jessy Oto, MD;  Location: [facility];  Service: Orthopedics;  Laterality: N/A;  . MASTECTOMY WITH AXILLARY LYMPH NODE DISSECTION  12/2003   "right" (03/20/2012)   . POSTERIOR CERVICAL FUSION/FORAMINOTOMY  2001  . SHOULDER ARTHROSCOPY W/ ROTATOR CUFF REPAIR  2003   Left  . THYROIDECTOMY  1977   Right  . TOTAL KNEE ARTHROPLASTY  2000   Right  . [place] of abdomen  07/2005   Fatty liver / kidney stones    OB History    No data available       Home Medications    Prior to Admission medications   Medication Sig Start Date End Date Taking? Authorizing Provider  aspirin 81 MG tablet Take 81 mg by mouth daily.    [provider]  Blood Glucose Calibration (TRUE METRIX LEVEL 2) Normal SOLN Use as directed. 08/20/15   Susy Frizzle, MD  Blood Glucose Monitoring Suppl (BLOOD GLUCOSE SYSTEM PAK) KIT Use as directed to monitor FSBS 3x daily. Dx: E11.9. Please dispense [employer] True Metrix. 08/20/15   Susy Frizzle, MD  Cholecalciferol (VITAMIN D3) 5000 UNITS CAPS Take 5,000 Units by mouth 2 (two) times daily.    [provider]  cyclobenzaprine (FLEXERIL) 10 MG tablet Take 1 tablet (10 mg total) by mouth 3 (three) times daily as needed for muscle spasms. 09/29/16   Molpus, John, MD  Diclofenac-Misoprostol 50-0.2 MG TBEC Take 1 tablet by mouth 3 (three) times daily after meals. 01/24/17   Jessy Oto, MD  fluticasone (FLONASE) 50 MCG/ACT nasal spray Place 2 sprays into both nostrils daily. 08/20/15   Susy Frizzle, MD  furosemide (LASIX) 40 MG tablet Take 1 tablet (40 mg total) by mouth daily. Patient taking differently: Take 40 mg by mouth daily. Prn 09/30/16   Susy Frizzle, MD  gabapentin (NEURONTIN) 300 MG capsule Take 1 capsule (300 mg total) by mouth 3 (three) times daily. 09/21/16   Susy Frizzle, MD  Glucose Blood (BLOOD GLUCOSE TEST STRIPS) STRP Use as directed to monitor FSBS 3x daily. Dx: E11.9. Please dispense [employer] True Metrix. 08/20/15   Susy Frizzle, MD  Insulin Glargine (TOUJEO SOLOSTAR) 300 UNIT/ML SOPN Inject 160 Units into the skin every morning. 10/19/16   Renato Shin, MD  Insulin Pen Needle (PEN  NEEDLES 5/16") 30G X 8 MM MISC Use to inject insulin 5x daily. 12/31/13   Susy Frizzle, MD  Lancet Devices MISC Use as directed to monitor FSBS 3x daily. Dx: E11.9. Please dispense TruePlus Ultra Thin. 08/20/15   Susy Frizzle, MD  Lancets MISC Use as directed to monitor FSBS 3x daily. Dx: E11.9. Please dispense TruePlus Ultra Thin. 08/20/15   Susy Frizzle, MD  levothyroxine (SYNTHROID, LEVOTHROID) 100 MCG tablet Take 1 tablet (100 mcg total) by mouth daily. 09/21/16   Susy Frizzle, MD  losartan-hydrochlorothiazide (HYZAAR) 50-12.5 MG tablet TAKE 1 TABLET BY MOUTH DAILY 11/15/16   Susy Frizzle, MD  meclizine (ANTIVERT) 12.5  MG tablet Take 1 tablet (12.5 mg total) by mouth 3 (three) times daily as needed for dizziness. 12/19/16   Alycia Rossetti, MD  metFORMIN (GLUCOPHAGE) 850 MG tablet Take 1 tablet (850 mg total) by mouth 2 (two) times daily with a meal. 09/21/16   Pickard, Cammie Mcgee, MD  Misc Natural Products (LAXATIVE FORMULA) TABS Take 1 tablet by mouth as needed (for BM).    [provider]  naproxen sodium (ANAPROX) 220 MG tablet Take 1 tablet (220 mg total) by mouth 2 (two) times daily with a meal. 12/19/16   Yamhill, Modena Nunnery, MD    Family History Family History  Problem Relation Age of Onset  . Hypertension Mother   . Diabetes Father   . Hypertension Father   . Hypertension Sister   . Hypertension Brother   . Hypertension Brother   . Diabetes Brother   . Diabetes Other        Family Hx. of Diabetes  . Hypertension Other        Family History of HTN    Social History Social History   Tobacco Use  . Smoking status: Never Smoker  . Smokeless tobacco: Never Used  Substance Use Topics  . Alcohol use: No  . Drug use: No     Allergies   Dilaudid [hydromorphone hcl]; Lipitor [atorvastatin]; Adhesive [tape]; Ampicillin; and Penicillins   Review of Systems Review of Systems  Constitutional: Positive for fatigue. Negative for fever.  HENT: Negative  for sore throat.   Eyes: Negative for visual disturbance.  Respiratory: Positive for shortness of breath. Negative for cough.   Cardiovascular: Positive for chest pain and leg swelling.  Gastrointestinal: Positive for nausea. Negative for abdominal pain, diarrhea and vomiting.  Genitourinary: Negative for difficulty urinating.  Musculoskeletal: Negative for back pain and neck pain.  Skin: Negative for rash.  Neurological: Negative for syncope and headaches.     Physical Exam Updated Vital Signs BP (!) 197/100   Pulse 62   Temp 98.7 F (37.1 C) (Oral)   Resp 20   Ht '5\' 5"'  (1.651 m)   Wt 94.8 kg (209 lb)   SpO2 96%   BMI 34.78 kg/m   Physical Exam  Constitutional: She is oriented to person, place, and time. She appears well-developed and well-nourished. No distress.  HENT:  Head: Normocephalic and atraumatic.  Eyes: Conjunctivae and EOM are normal.  Neck: Normal range of motion.  Cardiovascular: Normal rate, regular rhythm, normal heart sounds and intact distal pulses. Exam reveals no gallop and no friction rub.  No murmur heard. Pulmonary/Chest: Effort normal and breath sounds normal. No respiratory distress. She has no wheezes. She has no rales.  Abdominal: Soft. She exhibits no distension. There is no tenderness. There is no guarding.  Musculoskeletal: She exhibits no edema.       Right lower leg: She exhibits tenderness.  Neurological: She is alert and oriented to person, place, and time.  Skin: Skin is warm and dry. No rash noted. She is not diaphoretic. No erythema.  Nursing note and vitals reviewed.    ED Treatments / Results  Labs (all labs ordered are listed, but only abnormal results are displayed) Labs Reviewed  CBC - Abnormal; Notable for the following components:      Result Value   RBC 5.18 (*)    All other components within normal limits  D-DIMER, QUANTITATIVE (NOT AT [facility]) - Abnormal; Notable for the following components:   D-Dimer, Quant 0.84 (*)  All other components within normal limits  COMPREHENSIVE METABOLIC PANEL - Abnormal; Notable for the following components:   Glucose, Bld 264 (*)    Calcium 10.7 (*)    All other components within normal limits  TROPONIN I - Abnormal; Notable for the following components:   Troponin I 0.03 (*)    All other components within normal limits  TROPONIN I  BRAIN NATRIURETIC PEPTIDE  LIPASE, BLOOD    EKG  EKG Interpretation  Date/Time:  Saturday March 04 2017 17:14:17 EST Ventricular Rate:  66 PR Interval:  182 QRS Duration: 94 QT Interval:  398 QTC Calculation: 417 R Axis:   8 Text Interpretation:  Normal sinus rhythm Minimal voltage criteria for LVH, may be normal variant Borderline ECG No significant change since last tracing Confirmed by Gareth Morgan [phone]) on 03/04/2017 5:37:19 PM       Radiology Dg Chest 2 View  Result Date: 03/04/2017 CLINICAL DATA:  66 year old female with chest pain. EXAM: CHEST  2 VIEW COMPARISON:  08/30/2015 FINDINGS: The heart size and mediastinal contours are within normal limits. Both lungs are clear. The visualized skeletal structures are unremarkable. IMPRESSION: No active cardiopulmonary disease. Electronically Signed   By: Kristopher Oppenheim M.D.   On: 03/04/2017 18:24    Procedures .Critical Care Performed by: Gareth Morgan, MD Authorized by: Gareth Morgan, MD   Comments:     CRITICAL CARE: Chest pain, elevated troponin, transfer to Cardiac center Performed by: Tennis Must   Total critical care time: 30 minutes  Critical care time was exclusive of separately billable procedures and treating other patients.  Critical care was necessary to treat or prevent imminent or life-threatening deterioration.  Critical care was time spent personally by me on the following activities: development of treatment plan with patient and/or surrogate as well as nursing, discussions with consultants, evaluation of patient's response to  treatment, examination of patient, obtaining history from patient or surrogate, ordering and performing treatments and interventions, ordering and review of laboratory studies, ordering and review of radiographic studies, pulse oximetry and re-evaluation of patient's condition.    (including critical care time)  Angiocath insertion Performed by: Tennis Must  Consent: Verbal consent obtained. Risks and benefits: risks, benefits and alternatives were discussed Time out: Immediately prior to procedure a "time out" was called to verify the correct patient, procedure, equipment, support staff and site/side marked as required.  Preparation: Patient was prepped and draped in the usual sterile fashion.  Vein Location: left upper arm, lateral  Ultrasound Guided  Gauge: 20g  Normal blood return and flush without difficulty Patient tolerance: Patient tolerated the procedure well with no immediate complications.  Several attempts made prior to this with PIV working temporarily and then no longer flushing or drawing back.     Medications Ordered in ED Medications  nitroGLYCERIN (NITROSTAT) SL tablet 0.4 mg (0.4 mg Sublingual Given 03/04/17 1948)  iopamidol (ISOVUE-370) 76 % injection 100 mL (not administered)  aspirin chewable tablet 324 mg (324 mg Oral Given 03/04/17 1937)  morphine 4 MG/ML injection 4 mg (2 mg Intravenous Given 03/04/17 2320)  ondansetron (ZOFRAN) injection 4 mg (4 mg Intravenous Given 03/04/17 2320)  acetaminophen (TYLENOL) tablet 1,000 mg (1,000 mg Oral Given 03/05/17 0110)     Initial Impression / Assessment and Plan / ED Course  I have reviewed the triage vital signs and the nursing notes.  Pertinent labs & imaging results that were available during my care of the patient were reviewed by me and  considered in my medical decision making (see chart for details).     66 year old female with a history of breast cancer in remission, hypertension, hyperlipidemia,  diabetes presents with concern for chest pressure and shortness of breath.  EKG shows no acute findings.  Chest x-ray shows no sign of pneumonia or pneumothorax.  D-dimer was ordered and elevated, however unable to complete DVT study at time ordered, and due to difficult IV access, we were able to obtain a CT angiogram while in the emergency department.  Initial troponin was negative, however repeat troponin was mildly elevated at 0.03.  Patient's history is most concerning for anginal symptoms.  Her chest pain was initially improved with nitroglycerin, however persistent and mild.  Given morphine however IV infiltrated and slowly improved. Reports 2/10 CP prior to transfer.  Discussed with Dr. Hal Hope, who will admit the patient to [facility] for further chest pain evaluation.  At this time, I feel she has a reliable IV just prior to departure with [facility] which may be used for CT angiogram at [facility], and DVT studies may come be completed tomorrow. Did not order anticoagulation at this time as we await CT angio and continue to monitor trop.   [place] IV completed as transport team arrived.  Pt transferred in stable condition.   Final Clinical Impressions(s) / ED Diagnoses   Final diagnoses:  Chest pain with high risk for cardiac etiology  Elevated troponin  Elevated d-dimer    ED Discharge Orders    None       Gareth Morgan, MD 03/05/17 [phone]

## 2017-03-04 NOTE — ED Notes (Signed)
Alert, NAD, calm, interactive, resps e/u, speaking in clear complete sentences, no dyspnea noted, skin W&D, VSS, BP elevated, c/o central CP, 10/10, (denies: sob, nausea, dizziness or visual changes). [facility] x3 at [facility].

## 2017-03-05 ENCOUNTER — Observation Stay (HOSPITAL_COMMUNITY): Payer: Medicare HMO

## 2017-03-05 ENCOUNTER — Encounter (HOSPITAL_COMMUNITY): Payer: Self-pay | Admitting: Family Medicine

## 2017-03-05 ENCOUNTER — Observation Stay (HOSPITAL_BASED_OUTPATIENT_CLINIC_OR_DEPARTMENT_OTHER): Payer: Medicare HMO

## 2017-03-05 DIAGNOSIS — R7989 Other specified abnormal findings of blood chemistry: Secondary | ICD-10-CM | POA: Diagnosis present

## 2017-03-05 DIAGNOSIS — E039 Hypothyroidism, unspecified: Secondary | ICD-10-CM | POA: Diagnosis not present

## 2017-03-05 DIAGNOSIS — R079 Chest pain, unspecified: Secondary | ICD-10-CM

## 2017-03-05 DIAGNOSIS — E1142 Type 2 diabetes mellitus with diabetic polyneuropathy: Secondary | ICD-10-CM | POA: Diagnosis not present

## 2017-03-05 DIAGNOSIS — Z794 Long term (current) use of insulin: Secondary | ICD-10-CM | POA: Diagnosis not present

## 2017-03-05 DIAGNOSIS — E119 Type 2 diabetes mellitus without complications: Secondary | ICD-10-CM | POA: Diagnosis not present

## 2017-03-05 DIAGNOSIS — Z853 Personal history of malignant neoplasm of breast: Secondary | ICD-10-CM | POA: Diagnosis not present

## 2017-03-05 DIAGNOSIS — I5032 Chronic diastolic (congestive) heart failure: Secondary | ICD-10-CM | POA: Diagnosis not present

## 2017-03-05 DIAGNOSIS — R0789 Other chest pain: Secondary | ICD-10-CM

## 2017-03-05 DIAGNOSIS — R748 Abnormal levels of other serum enzymes: Secondary | ICD-10-CM

## 2017-03-05 DIAGNOSIS — Z96651 Presence of right artificial knee joint: Secondary | ICD-10-CM | POA: Diagnosis not present

## 2017-03-05 DIAGNOSIS — I110 Hypertensive heart disease with heart failure: Secondary | ICD-10-CM | POA: Diagnosis not present

## 2017-03-05 DIAGNOSIS — R778 Other specified abnormalities of plasma proteins: Secondary | ICD-10-CM

## 2017-03-05 LAB — ECHOCARDIOGRAM COMPLETE
CHL CUP DOP CALC LVOT VTI: 17.4 cm
CHL CUP MV DEC (S): 264
CHL CUP PV REG GRAD DIAS: 7 mmHg
CHL CUP REG VEL DIAS: 136 cm/s
CHL CUP RV SYS PRESS: 16 mmHg
E decel time: 264 msec
EERAT: 4.52
FS: 32 % (ref 28–44)
HEIGHTINCHES: 64 in
IV/PV OW: 1.29
LA ID, A-P, ES: 41 mm
LA diam end sys: 41 mm
LA diam index: 2.11 cm/m2
LA vol A4C: 39.1 ml
LA vol index: 20.5 mL/m2
LAVOL: 39.7 mL
LV TDI E'LATERAL: 9.97
LVEEAVG: 4.52
LVEEMED: 4.52
LVELAT: 9.97 cm/s
LVOT area: 3.46 cm2
LVOT diameter: 21 mm
LVOTPV: 86.5 cm/s
LVOTSV: 60 mL
MV pk A vel: 68.4 m/s
MVPKEVEL: 45.1 m/s
PW: 8.81 mm — AB (ref 0.6–1.1)
RV LATERAL S' VELOCITY: 9.73 cm/s
Reg peak vel: 181 cm/s
TAPSE: 16.6 mm
TDI e' medial: 5.46
TRMAXVEL: 181 cm/s
WEIGHTICAEL: 3124.8 [oz_av]

## 2017-03-05 LAB — GLUCOSE, CAPILLARY
GLUCOSE-CAPILLARY: 205 mg/dL — AB (ref 65–99)
GLUCOSE-CAPILLARY: 150 mg/dL — AB (ref 65–99)
Glucose-Capillary: 161 mg/dL — ABNORMAL HIGH (ref 65–99)
Glucose-Capillary: 180 mg/dL — ABNORMAL HIGH (ref 65–99)
Glucose-Capillary: 141 mg/dL — ABNORMAL HIGH (ref 65–99)

## 2017-03-05 LAB — CBC
HEMATOCRIT: 43.8 % (ref 36.0–46.0)
HEMOGLOBIN: 14.3 g/dL (ref 12.0–15.0)
MCH: 29.2 pg (ref 26.0–34.0)
MCHC: 32.6 g/dL (ref 30.0–36.0)
MCV: 89.4 fL (ref 78.0–100.0)
Platelets: 241 10*3/uL (ref 150–400)
RBC: 4.9 MIL/uL (ref 3.87–5.11)
RDW: 13.8 % (ref 11.5–15.5)
WBC: 9.2 10*3/uL (ref 4.0–10.5)

## 2017-03-05 LAB — BASIC METABOLIC PANEL
ANION GAP: 8 (ref 5–15)
BUN: 15 mg/dL (ref 6–20)
CALCIUM: 9.9 mg/dL (ref 8.9–10.3)
CO2: 26 mmol/L (ref 22–32)
Chloride: 103 mmol/L (ref 101–111)
Creatinine, Ser: 0.76 mg/dL (ref 0.44–1.00)
GFR calc Af Amer: >60 mL/min (ref >60)
GLUCOSE: 206 mg/dL — AB (ref 65–99)
Potassium: 3.5 mmol/L (ref 3.5–5.1)
Sodium: 137 mmol/L (ref 135–145)

## 2017-03-05 LAB — TROPONIN I: Troponin I: <0.03 ng/mL (ref <0.03)

## 2017-03-05 LAB — MRSA PCR SCREENING: MRSA BY PCR: NEGATIVE

## 2017-03-05 MED ORDER — INSULIN ASPART 100 UNIT/ML ~~LOC~~ SOLN
0.0000 [IU] | Freq: Three times a day (TID) | SUBCUTANEOUS | Status: DC
Start: 2017-03-05 — End: 2017-03-07
  Administered 2017-03-05: 3 [IU] via SUBCUTANEOUS
  Administered 2017-03-05: 5 [IU] via SUBCUTANEOUS
  Administered 2017-03-05: 2 [IU] via SUBCUTANEOUS

## 2017-03-05 MED ORDER — LOSARTAN POTASSIUM-HCTZ 50-12.5 MG PO TABS: 1.0000 | ORAL_TABLET | Freq: Every day | ORAL | Status: DC

## 2017-03-05 MED ORDER — IOPAMIDOL (ISOVUE-370) INJECTION 76%
INTRAVENOUS | Status: AC
  Administered 2017-03-05: 100 mL
  Filled 2017-03-05: qty 100

## 2017-03-05 MED ORDER — ACETAMINOPHEN 325 MG PO TABS
650.0000 mg | ORAL_TABLET | ORAL | Status: DC | PRN
  Administered 2017-03-06 – 2017-03-07 (×2): 650 mg via ORAL
  Filled 2017-03-05 (×2): qty 2

## 2017-03-05 MED ORDER — INSULIN GLARGINE 100 UNIT/ML ~~LOC~~ SOLN
140.0000 [IU] | Freq: Every day | SUBCUTANEOUS | Status: DC
  Filled 2017-03-05: qty 1.4

## 2017-03-05 MED ORDER — NITROGLYCERIN 0.4 MG SL SUBL
0.4000 mg | SUBLINGUAL_TABLET | SUBLINGUAL | Status: DC | PRN
  Administered 2017-03-05: 0.4 mg via SUBLINGUAL
  Filled 2017-03-05: qty 1

## 2017-03-05 MED ORDER — MORPHINE SULFATE (PF) 2 MG/ML IV SOLN
2.0000 mg | INTRAVENOUS | Status: DC | PRN
  Administered 2017-03-05 (×2): 2 mg via INTRAVENOUS
  Filled 2017-03-05 (×2): qty 1

## 2017-03-05 MED ORDER — INSULIN GLARGINE 100 UNIT/ML ~~LOC~~ SOLN
80.0000 [IU] | Freq: Every day | SUBCUTANEOUS | Status: DC
  Administered 2017-03-05 – 2017-03-07 (×3): 80 [IU] via SUBCUTANEOUS
  Filled 2017-03-05 (×3): qty 0.8

## 2017-03-05 MED ORDER — METOPROLOL TARTRATE 5 MG/5ML IV SOLN: 5.0000 mg | INTRAVENOUS | Status: DC | PRN

## 2017-03-05 MED ORDER — ACETAMINOPHEN 500 MG PO TABS
1000.0000 mg | ORAL_TABLET | Freq: Once | ORAL | Status: AC
  Administered 2017-03-05: 1000 mg via ORAL
  Filled 2017-03-05: qty 2

## 2017-03-05 MED ORDER — LOSARTAN POTASSIUM 50 MG PO TABS
50.0000 mg | ORAL_TABLET | Freq: Every day | ORAL | Status: DC
  Administered 2017-03-05 – 2017-03-07 (×3): 50 mg via ORAL
  Filled 2017-03-05 (×3): qty 1

## 2017-03-05 MED ORDER — ASPIRIN EC 81 MG PO TBEC
81.0000 mg | DELAYED_RELEASE_TABLET | Freq: Every day | ORAL | Status: DC
  Administered 2017-03-05 – 2017-03-07 (×3): 81 mg via ORAL
  Filled 2017-03-05 (×3): qty 1

## 2017-03-05 MED ORDER — FLUTICASONE PROPIONATE 50 MCG/ACT NA SUSP
2.0000 | Freq: Every day | NASAL | Status: DC
  Administered 2017-03-05: 2 via NASAL
  Filled 2017-03-05: qty 16

## 2017-03-05 MED ORDER — ONDANSETRON HCL 4 MG/2ML IJ SOLN: 4.0000 mg | Freq: Four times a day (QID) | INTRAMUSCULAR | Status: DC | PRN

## 2017-03-05 MED ORDER — ASPIRIN 81 MG PO TABS: 81.0000 mg | ORAL_TABLET | Freq: Every day | ORAL | Status: DC

## 2017-03-05 MED ORDER — MORPHINE SULFATE (PF) 2 MG/ML IV SOLN
2.0000 mg | INTRAVENOUS | Status: DC | PRN
  Administered 2017-03-06: 4 mg via INTRAVENOUS
  Administered 2017-03-07: 2 mg via INTRAVENOUS
  Filled 2017-03-05: qty 2
  Filled 2017-03-05: qty 1

## 2017-03-05 MED ORDER — ENOXAPARIN SODIUM 40 MG/0.4ML ~~LOC~~ SOLN
40.0000 mg | SUBCUTANEOUS | Status: DC
  Administered 2017-03-05 – 2017-03-07 (×3): 40 mg via SUBCUTANEOUS
  Filled 2017-03-05 (×3): qty 0.4

## 2017-03-05 MED ORDER — VITAMIN D 1000 UNITS PO TABS
5000.0000 [IU] | ORAL_TABLET | Freq: Two times a day (BID) | ORAL | Status: DC
  Administered 2017-03-05 – 2017-03-07 (×5): 5000 [IU] via ORAL
  Filled 2017-03-05 (×5): qty 5

## 2017-03-05 MED ORDER — MECLIZINE HCL 25 MG PO TABS
12.5000 mg | ORAL_TABLET | Freq: Three times a day (TID) | ORAL | Status: DC | PRN
  Filled 2017-03-05: qty 1

## 2017-03-05 MED ORDER — GABAPENTIN 300 MG PO CAPS
300.0000 mg | ORAL_CAPSULE | Freq: Three times a day (TID) | ORAL | Status: DC
  Administered 2017-03-05 – 2017-03-07 (×8): 300 mg via ORAL
  Filled 2017-03-05 (×8): qty 1

## 2017-03-05 MED ORDER — HYDROCHLOROTHIAZIDE 12.5 MG PO CAPS
12.5000 mg | ORAL_CAPSULE | Freq: Every day | ORAL | Status: DC
  Administered 2017-03-05 – 2017-03-07 (×3): 12.5 mg via ORAL
  Filled 2017-03-05 (×3): qty 1

## 2017-03-05 MED ORDER — INSULIN ASPART 100 UNIT/ML ~~LOC~~ SOLN: 0.0000 [IU] | Freq: Every day | SUBCUTANEOUS | Status: DC

## 2017-03-05 MED ORDER — ONDANSETRON HCL 4 MG/2ML IJ SOLN
4.0000 mg | Freq: Four times a day (QID) | INTRAMUSCULAR | Status: DC | PRN
  Administered 2017-03-05 – 2017-03-07 (×4): 4 mg via INTRAVENOUS
  Filled 2017-03-05 (×4): qty 2

## 2017-03-05 MED ORDER — LEVOTHYROXINE SODIUM 100 MCG PO TABS
100.0000 ug | ORAL_TABLET | Freq: Every day | ORAL | Status: DC
  Administered 2017-03-05 – 2017-03-07 (×3): 100 ug via ORAL
  Filled 2017-03-05 (×3): qty 1

## 2017-03-05 MED ORDER — CYCLOBENZAPRINE HCL 10 MG PO TABS: 10.0000 mg | ORAL_TABLET | Freq: Three times a day (TID) | ORAL | Status: DC | PRN

## 2017-03-05 MED ORDER — INSULIN GLARGINE 100 UNIT/ML ~~LOC~~ SOLN: 80.0000 [IU] | Freq: Every day | SUBCUTANEOUS | Status: DC

## 2017-03-05 MED ORDER — ACETAMINOPHEN 325 MG PO TABS: 650.0000 mg | ORAL_TABLET | ORAL | Status: DC | PRN

## 2017-03-05 NOTE — Progress Notes (Signed)
  Echocardiogram 2D Echocardiogram has been performed.  [name] 03/05/2017, 3:29 PM

## 2017-03-05 NOTE — H&P (Signed)
History and Physical    MASHA ORBACH OHY:[id number] DOB: 12-Jul-1950 DOA: 03/04/2017  PCP: Susy Frizzle, MD   Patient coming from: Home, by way of [facility]   Chief Complaint: Chest pain   HPI: KELVIN BURPEE is a 66 y.o. female with medical history significant for hypertension, insulin-dependent diabetes mellitus, breast cancer status post mastectomy and in remission, hypothyroidism, and chronic diastolic CHF, now presenting to the emergency department for evaluation of chest pain.  The patient reports that she has been experiencing central chest discomfort for more than a month now, described as pressure-like, constant, worse with movements, radiating to the right arm, and associated with mild dyspnea and nausea, but no vomiting.  Some leg swelling for the past few weeks, right more so than left.  No fevers or chills.  No significant cough.  She was evaluated at an urgent care for these complaints and directed to the ED. No personal hx of VTE, though daughter had DVT while on hormonal contraception.   [facility] ED Course: Upon arrival to the ED, patient is found to be afebrile, saturating well on room air, hypertensive, but with vitals otherwise stable.  EKG features normal sinus rhythm and chest x-ray is negative for acute cardiopulmonary disease.  Chemistry panel is notable for a serum glucose of 264.  CBC is unremarkable, initial troponin is undetectable, and BNP is normal.  D-dimer is elevated to a value of 0.84 and second troponin is at the borderline of elevation at 0.03.  Patient was treated with 324 mg of aspirin and nitroglycerin in the ED.  She reported some improvement in the chest discomfort with nitroglycerin.  CTA cannot be performed in the emergency department due to poor IV access.  Admission to [facility] was arranged for further evaluation and management of chest pain with unremarkable EKG, normal chest x-ray, slightly elevated troponin, and positive  d-dimer.  Review of Systems:  All other systems reviewed and apart from HPI, are negative.  Past Medical History:  Diagnosis Date  . Anginal pain ([place]) 03/19/2012   saw Dr. Cathie Olden .. she thinks its more related to stomach issues  . Arthritis    "all over; mostly in my back" (03/20/2012)  . Breast cancer ([place])    "right" (03/20/2012)  . Cardiomyopathy   . Chest pain 06/2005   Hospitalized, dystolic dysfunction  . Chronic lower back pain    "have 2 herniated discs; going to have to have a fusion" (03/20/2012)  . Family history of anesthesia complication    "father has nausea and vomiting"  . GERD (gastroesophageal reflux disease)   . H/O hiatal hernia   . History of kidney stones   . History of right mastectomy   . Hypertension    "patient states never had HTN, takes Hyzaar for heart  . Hypothyroidism   . Irritable bowel syndrome   . Migraines    "it's been a long time since I've had one" (03/20/2012)  . Osteoarthritis   . Peripheral neuropathy   . PONV (postoperative nausea and vomiting)   . Sciatica   . Type II diabetes mellitus ([place])     Past Surgical History:  Procedure Laterality Date  . ABDOMINAL HYSTERECTOMY  1993  . adenosin cardiolite  07/2005   (+) wall motion abnormality  . AXILLARY LYMPH NODE DISSECTION  2003   squamous cell cancer  . BACK SURGERY    . CARDIAC CATHETERIZATION  ? 2005 / 2007  . CT abdomen  and pelvis  02/2010   Same  . ESOPHAGOGASTRODUODENOSCOPY  2005   GERD  . KNEE ARTHROSCOPY  1990's   "right" (03/20/2012)  . LUMBAR FUSION N/A 08/22/2014   Procedure: Right L2-3 and right L1-2 transforaminal lumbar interbody fusion with pedicle screws, rods, sleeves, cages, local bone graft, Vivigen, cancellous chips;  Surgeon: Jessy Oto, MD;  Location: [place];  Service: Orthopedics;  Laterality: N/A;  . LUMBAR LAMINECTOMY/DECOMPRESSION MICRODISCECTOMY N/A 02/04/2014   Procedure: RIGHT L2-3 MICRODISCECTOMY ;  Surgeon: Jessy Oto, MD;  Location: [place];  Service: Orthopedics;  Laterality: N/A;  . MASTECTOMY WITH AXILLARY LYMPH NODE DISSECTION  12/2003   "right" (03/20/2012)  . POSTERIOR CERVICAL FUSION/FORAMINOTOMY  2001  . SHOULDER ARTHROSCOPY W/ ROTATOR CUFF REPAIR  2003   Left  . THYROIDECTOMY  1977   Right  . TOTAL KNEE ARTHROPLASTY  2000   Right  . [place] of abdomen  07/2005   Fatty liver / kidney stones     reports that  has never smoked. she has never used smokeless tobacco. She reports that she does not drink alcohol or use drugs.  Allergies  Allergen Reactions  . Dilaudid [Hydromorphone Hcl] Nausea And Vomiting  . Lipitor [Atorvastatin] Other (See Comments)    Body & Muscle Aches  . Adhesive [Tape] Rash  . Ampicillin Rash    REACTION: Rash and welts on her hands when she took it in the hospital  . Penicillins Rash    Family History  Problem Relation Age of Onset  . Hypertension Mother   . Diabetes Father   . Hypertension Father   . Hypertension Sister   . Hypertension Brother   . Hypertension Brother   . Diabetes Brother   . Diabetes Other        Family Hx. of Diabetes  . Hypertension Other        Family History of HTN  . Deep vein thrombosis Daughter      Prior to Admission medications   Medication Sig Start Date End Date Taking? Authorizing Provider  aspirin 81 MG tablet Take 81 mg by mouth daily.    [provider]  Blood Glucose Calibration (TRUE METRIX LEVEL 2) Normal SOLN Use as directed. 08/20/15   Susy Frizzle, MD  Blood Glucose Monitoring Suppl (BLOOD GLUCOSE SYSTEM PAK) KIT Use as directed to monitor FSBS 3x daily. Dx: E11.9. Please dispense [employer] True Metrix. 08/20/15   Susy Frizzle, MD  Cholecalciferol (VITAMIN D3) 5000 UNITS CAPS Take 5,000 Units by mouth 2 (two) times daily.    [provider]  cyclobenzaprine (FLEXERIL) 10 MG tablet Take 1 tablet (10 mg total) by mouth 3 (three) times daily as needed for muscle spasms. 09/29/16   Molpus, John, MD    Diclofenac-Misoprostol 50-0.2 MG TBEC Take 1 tablet by mouth 3 (three) times daily after meals. 01/24/17   Jessy Oto, MD  fluticasone (FLONASE) 50 MCG/ACT nasal spray Place 2 sprays into both nostrils daily. 08/20/15   Susy Frizzle, MD  furosemide (LASIX) 40 MG tablet Take 1 tablet (40 mg total) by mouth daily. Patient taking differently: Take 40 mg by mouth daily. Prn 09/30/16   Susy Frizzle, MD  gabapentin (NEURONTIN) 300 MG capsule Take 1 capsule (300 mg total) by mouth 3 (three) times daily. 09/21/16   Susy Frizzle, MD  Glucose Blood (BLOOD GLUCOSE TEST STRIPS) STRP Use as directed to monitor FSBS 3x daily. Dx: E11.9. Please dispense [employer] True Metrix.  08/20/15   Susy Frizzle, MD  Insulin Glargine (TOUJEO SOLOSTAR) 300 UNIT/ML SOPN Inject 160 Units into the skin every morning. 10/19/16   Renato Shin, MD  Insulin Pen Needle (PEN NEEDLES 5/16") 30G X 8 MM MISC Use to inject insulin 5x daily. 12/31/13   Susy Frizzle, MD  Lancet Devices MISC Use as directed to monitor FSBS 3x daily. Dx: E11.9. Please dispense TruePlus Ultra Thin. 08/20/15   Susy Frizzle, MD  Lancets MISC Use as directed to monitor FSBS 3x daily. Dx: E11.9. Please dispense TruePlus Ultra Thin. 08/20/15   Susy Frizzle, MD  levothyroxine (SYNTHROID, LEVOTHROID) 100 MCG tablet Take 1 tablet (100 mcg total) by mouth daily. 09/21/16   Susy Frizzle, MD  losartan-hydrochlorothiazide (HYZAAR) 50-12.5 MG tablet TAKE 1 TABLET BY MOUTH DAILY 11/15/16   Susy Frizzle, MD  meclizine (ANTIVERT) 12.5 MG tablet Take 1 tablet (12.5 mg total) by mouth 3 (three) times daily as needed for dizziness. 12/19/16   Alycia Rossetti, MD  metFORMIN (GLUCOPHAGE) 850 MG tablet Take 1 tablet (850 mg total) by mouth 2 (two) times daily with a meal. 09/21/16   Pickard, Cammie Mcgee, MD  Misc Natural Products (LAXATIVE FORMULA) TABS Take 1 tablet by mouth as needed (for BM).    [provider]  naproxen sodium (ANAPROX)  220 MG tablet Take 1 tablet (220 mg total) by mouth 2 (two) times daily with a meal. 12/19/16   Alycia Rossetti, MD    Physical Exam: Vitals:   03/04/17 2350 03/05/17 0020 03/05/17 0045 03/05/17 0146  BP:    (!) 174/94  Pulse: 62   66  Resp: _0 Temp:    (!) 97.4 F (36.3 C)  TempSrc:    Oral  SpO2: 96%   99%  Weight:    88.6 kg (195 lb 4.8 oz)  Height:    _1  (1.626 m)      Constitutional: NAD, calm, comfortable Eyes: PERTLA, lids and conjunctivae normal ENMT: Mucous membranes are moist. Posterior pharynx clear of any exudate or lesions.   Neck: normal, supple, no masses, no thyromegaly Respiratory: clear to auscultation bilaterally, no wheezing, no crackles. Normal respiratory effort.    Cardiovascular: S1 & S2 heard, regular rate and rhythm. Trace pretibial edema, left >right.  No significant JVD. Abdomen: No distension, no tenderness, no masses palpated. Bowel sounds normal.  Musculoskeletal: no clubbing / cyanosis. No joint deformity upper and lower extremities.   Skin: no significant rashes, lesions, ulcers. Warm, dry, well-perfused. Neurologic: CN 2-12 grossly intact. Sensation intact. Strength 5/5 in all 4 limbs.  Psychiatric:  Alert and oriented x 3. Pleasant, cooperative.     Labs on Admission: I have personally reviewed following labs and imaging studies  CBC: Recent Labs  Lab 03/04/17 2005  WBC 7.1  HGB 15.0  HCT 46.0  MCV 88.8  PLT 165   Basic Metabolic Panel: Recent Labs  Lab 03/04/17 2005  NA 137  K 4.4  CL 102  CO2 28  GLUCOSE 264*  BUN 20  CREATININE 0.79  CALCIUM 10.7*   GFR: Estimated Creatinine Clearance: 72.6 mL/min (by C-G formula based on SCr of 0.79 mg/dL). Liver Function Tests: Recent Labs  Lab 03/04/17 2005  AST 29  ALT 28  ALKPHOS 67  BILITOT 0.5  PROT 8.1  ALBUMIN 4.0   Recent Labs  Lab 03/04/17 2005  LIPASE 45   No results for input(s): AMMONIA in the last  168 hours. Coagulation Profile: No results  for input(s): INR, PROTIME in the last 168 hours. Cardiac Enzymes: Recent Labs  Lab 03/04/17 2005 03/04/17 2240  TROPONINI <0.03 0.03*   BNP (last 3 results) No results for input(s): PROBNP in the last 8760 hours. HbA1C: No results for input(s): HGBA1C in the last 72 hours. CBG: Recent Labs  Lab 03/05/17 0227  GLUCAP 161*   Lipid Profile: No results for input(s): CHOL, HDL, LDLCALC, TRIG, CHOLHDL, LDLDIRECT in the last 72 hours. Thyroid Function Tests: No results for input(s): TSH, T4TOTAL, FREET4, T3FREE, THYROIDAB in the last 72 hours. Anemia Panel: No results for input(s): VITAMINB12, FOLATE, FERRITIN, TIBC, IRON, RETICCTPCT in the last 72 hours. Urine analysis:    Component Value Date/Time   COLORURINE YELLOW 06/23/2015 1106   APPEARANCEUR CLEAR 06/23/2015 1106   LABSPEC 1.025 06/23/2015 1106   PHURINE 7.0 06/23/2015 1106   GLUCOSEU 2+ (A) 06/23/2015 1106   HGBUR TRACE (A) 06/23/2015 1106   HGBUR negative 02/25/2010 1034   BILIRUBINUR NEGATIVE 06/23/2015 1106   KETONESUR NEGATIVE 06/23/2015 1106   PROTEINUR 3+ (A) 06/23/2015 1106   UROBILINOGEN 0.2 09/13/2014 1725   NITRITE NEGATIVE 06/23/2015 1106   LEUKOCYTESUR NEGATIVE 06/23/2015 1106   Sepsis Labs: _0 (procalcitonin:4,lacticidven:4) )No results found for this or any previous visit (from the past 240 hour(s)).   Radiological Exams on Admission: Dg Chest 2 View  Result Date: 03/04/2017 CLINICAL DATA:  66 year old female with chest pain. EXAM: CHEST  2 VIEW COMPARISON:  08/30/2015 FINDINGS: The heart size and mediastinal contours are within normal limits. Both lungs are clear. The visualized skeletal structures are unremarkable. IMPRESSION: No active cardiopulmonary disease. Electronically Signed   By: Kristopher Oppenheim M.D.   On: 03/04/2017 18:24    EKG: Independently reviewed. Normal sinus rhythm.   Assessment/Plan  1. Chest pain  - Pt presents with 1 month of chest pain, noted to have unremarkable  CXR and EKG, slight elevation in troponin, and positive d-dimer  - She was treated in ED with ASA 324 mg - Plan to continue cardiac monitoring, trend troponin, obtain CTA chest and BLE venous dopplers, continue ASA and ARB, continue prn NTG, repeat EKG    2. Insulin-dependent DM  - A1c was 9.3% in September 2018  - Managed at home with Toujeo 160 units qD and metformin - Check CBG's, start Lantus 140 units qD with Novolog correctional    3. Hypertension  - BP elevated in ED  - Continue losartan-HCTZ, use prn beta-blocker   4. Chronic diastolic CHF  - Appears well-compensated  - Continue ARB, follow daily wt and I/O's   5. Hypothyroidism  - Thyroid studies wnl in September 2018 - Continue Synthroid    DVT prophylaxis: Lovenox  Code Status: Full  Family Communication: Discussed with patient Disposition Plan: Observe on SDU Consults called: None Admission status: Observation    Vianne Bulls, MD [facility] Pager [phone]  If 7PM-7AM, please contact night-coverage [email or web address] Password [email or web address]  03/05/2017, 3:04 AM

## 2017-03-05 NOTE — ED Notes (Signed)
EDP at [facility]. Family at [facility]. [facility] arrives to [facility]. No changes.

## 2017-03-05 NOTE — ED Notes (Signed)
EDP remains at [facility], no changes.

## 2017-03-05 NOTE — Progress Notes (Signed)
[facility] TEAM 1 - Stepdown/ICU TEAM  [name]  EXH:[id number] DOB: 02/25/1951 DOA: 03/04/2017 PCP: Susy Frizzle, MD    Brief Narrative:  66 y.o. female with a history of HTN, DM, breast cancer status post mastectomy, hypothyroidism, and chronic diastolic CHF who presented to the ED w/ atypical CP for more than a month (pressure-like, constant, worse with movements, radiating to the right arm, and associated with mild dyspnea and nausea).    In the ED EKG noted normal sinus rhythm and chest x-ray was negative for acute cardiopulmonary disease.  Initial troponin was undetectable.  D-dimer was elevated at 0.84 and second troponin was borderline at 0.03.  CTa was not able to be done due to "poor IV access."  Subjective: Pt is seen for a f/u visit.    Assessment & Plan:  Chest pain  No PE on CTa chest - normal myoview July 2017 - Cards to repeat myoview  DM A1c 9.12 December 2016   HTN  Chronic diastolic CHF  Hypothyroidism   DVT prophylaxis: lovenox  Code Status: FULL CODE Family Communication: no family present at time of exam  Disposition Plan:   Consultants:  Cardiology   Procedures: none  Antimicrobials:  none  Objective: Blood pressure (!) 147/70, pulse (!) 57, temperature (!) 97.4 F (36.3 C), temperature source Oral, resp. rate 19, height 5\' 4"  (1.626 m), weight 88.6 kg (195 lb 4.8 oz), SpO2 100 %.  Intake/Output Summary (Last 24 hours) at 03/05/2017 1121 Last data filed at 03/05/2017 1025 Gross per 24 hour  Intake 500 ml  Output 500 ml  Net 0 ml   Filed Weights   03/04/17 1718 03/05/17 0146  Weight: 94.8 kg (209 lb) 88.6 kg (195 lb 4.8 oz)    Examination: Pt was seen for a f/u visit.    CBC: Recent Labs  Lab 03/04/17 2005 03/05/17 0437  WBC 7.1 9.2  HGB 15.0 14.3  HCT 46.0 43.8  MCV 88.8 89.4  PLT 239 381   Basic Metabolic Panel: Recent Labs  Lab 03/04/17 2005 03/05/17 0437  NA 137 137  K 4.4 3.5  CL 102 103  CO2 28 26   GLUCOSE 264* 206*  BUN 20 15  CREATININE 0.79 0.76  CALCIUM 10.7* 9.9   GFR: Estimated Creatinine Clearance: 72.6 mL/min (by C-G formula based on SCr of 0.76 mg/dL).  Liver Function Tests: Recent Labs  Lab 03/04/17 2005  AST 29  ALT 28  ALKPHOS 67  BILITOT 0.5  PROT 8.1  ALBUMIN 4.0   Recent Labs  Lab 03/04/17 2005  LIPASE 45    Cardiac Enzymes: Recent Labs  Lab 03/04/17 2005 03/04/17 2240 03/05/17 0437 03/05/17 0757  TROPONINI <0.03 0.03* <0.03 <0.03    HbA1C: Hemoglobin A1C  Date/Time Value Ref Range Status  09/07/2016 11:23 AM 11.3  Final   Hgb A1c MFr Bld  Date/Time Value Ref Range Status  12/19/2016 12:25 PM 9.3 (H) <5.7 % of total Hgb Final    Comment:    For someone without known diabetes, a hemoglobin A1c value of 6.5% or greater indicates that they may have  diabetes and this should be confirmed with a follow-up  test. . For someone with known diabetes, a value <7% indicates  that their diabetes is well controlled and a value  greater than or equal to 7% indicates suboptimal  control. A1c targets should be individualized based on  duration of diabetes, age, comorbid conditions, and  other considerations. . Currently,  no consensus exists regarding use of hemoglobin A1c for diagnosis of diabetes for children. [name] Kitchen   06/30/2016 09:44 AM 11.6 (H) <5.7 % Final    Comment:      For someone without known diabetes, a hemoglobin A1c value of 6.5% or greater indicates that they may have diabetes and this should be confirmed with a follow-up test.   For someone with known diabetes, a value <7% indicates that their diabetes is well controlled and a value greater than or equal to 7% indicates suboptimal control. A1c targets should be individualized based on duration of diabetes, age, comorbid conditions, and other considerations.   Currently, no consensus exists for use of hemoglobin A1c for diagnosis of diabetes for children.        CBG: Recent Labs  Lab 03/05/17 0227 03/05/17 0833  GLUCAP 161* 180*    Recent Results (from the past 240 hour(s))  MRSA PCR Screening     Status: None   Collection Time: 03/05/17  2:26 AM  Result Value Ref Range Status   MRSA by PCR NEGATIVE NEGATIVE Final    Comment:        The GeneXpert MRSA Assay (FDA approved for NASAL specimens only), is one component of a comprehensive MRSA colonization surveillance program. It is not intended to diagnose MRSA infection nor to guide or monitor treatment for MRSA infections.      Scheduled Meds: . aspirin EC  81 mg Oral Daily  . cholecalciferol  5,000 Units Oral BID  . enoxaparin (LOVENOX) injection  40 mg Subcutaneous Q24H  . fluticasone  2 spray Each Nare Daily  . gabapentin  300 mg Oral TID  . hydrochlorothiazide  12.5 mg Oral Daily  . insulin aspart  0-15 Units Subcutaneous TID WC  . insulin aspart  0-5 Units Subcutaneous QHS  . insulin glargine  80 Units Subcutaneous Daily  . levothyroxine  100 mcg Oral QAC breakfast  . losartan  50 mg Oral Daily     LOS: 0 days   Time spent: No Charge  Cherene Altes, MD [facility] Office  [phone] Pager - Text Page per Amion as per below:  On-Call/Text Page:      [email or web address]      password [email or web address]  If 7PM-7AM, please contact night-coverage [email or web address] Password [facility] 03/05/2017, 11:21 AM

## 2017-03-05 NOTE — ED Notes (Signed)
No changes. EDP, [facility] and Family at [facility].

## 2017-03-05 NOTE — Consult Note (Signed)
CARDIOLOGY CONSULT NOTE       Patient ID: [name] MRN: [id number] DOB/AGE: Feb 05, 1951 66 y.o.  Admit date: 03/04/2017 Referring Physician: Dorothy Puffer Primary Physician: Susy Frizzle, MD Primary Cardiologist: Curt Bears Reason for Consultation: Chest Pain  Principal Problem:   Chest pain Active Problems:   Hypothyroidism   Diabetes with neurologic complications ([place])   Chronic diastolic heart failure (HCC)   Positive D dimer   Elevated d-dimer   Elevated troponin   HPI:  [name] is a 66 y.o. female with medical history significant for hypertension, insulin-dependent diabetes mellitus, breast cancer status post mastectomy and in remission, hypothyroidism, and chronic diastolic CHF, now presenting to the emergency department for evaluation of chest pain.  The patient reports that she has been experiencing central chest discomfort for more than a month now, described as pressure-like, constant, worse with movements, radiating to the right arm, and associated with mild dyspnea and nausea, but no vomiting she was transferred from [facility] unable to start IV At [facility] CTA negative for PE. Still complaining of central chest pain at rest this am Despite prolonged pain troponin negative and no ECG changes. Seen by Dr Curt Bears for palpitations and chest pain 10/29/15 echo normal and myovue normal     ROS All other systems reviewed and negative except as noted above  Past Medical History:  Diagnosis Date  . Anginal pain ([place]) 03/19/2012   saw Dr. Cathie Olden .. she thinks its more related to stomach issues  . Arthritis    "all over; mostly in my back" (03/20/2012)  . Breast cancer ([place])    "right" (03/20/2012)  . Cardiomyopathy   . Chest pain 06/2005   Hospitalized, dystolic dysfunction  . Chronic lower back pain    "have 2 herniated discs; going to have to have a fusion" (03/20/2012)  . Family history of anesthesia complication    "father has nausea and vomiting"  . GERD  (gastroesophageal reflux disease)   . H/O hiatal hernia   . History of kidney stones   . History of right mastectomy   . Hypertension    "patient states never had HTN, takes Hyzaar for heart  . Hypothyroidism   . Irritable bowel syndrome   . Migraines    "it's been a long time since I've had one" (03/20/2012)  . Osteoarthritis   . Peripheral neuropathy   . PONV (postoperative nausea and vomiting)   . Sciatica   . Type II diabetes mellitus (HCC)     Family History  Problem Relation Age of Onset  . Hypertension Mother   . Diabetes Father   . Hypertension Father   . Hypertension Sister   . Hypertension Brother   . Hypertension Brother   . Diabetes Brother   . Diabetes Other        Family Hx. of Diabetes  . Hypertension Other        Family History of HTN  . Deep vein thrombosis Daughter     Social History   Socioeconomic History  . Marital status: Married    Spouse name: Not on file  . Number of children: 1  . Years of education: Not on file  . Highest education level: Not on file  Social Needs  . Financial resource strain: Not on file  . Food insecurity - worry: Not on file  . Food insecurity - inability: Not on file  . Transportation needs - medical: Not on file  . Transportation needs - non-medical: Not on  file  Occupational History  . Occupation: Retired since 2003  Tobacco Use  . Smoking status: Never Smoker  . Smokeless tobacco: Never Used  Substance and Sexual Activity  . Alcohol use: No  . Drug use: No  . Sexual activity: Not on file  Other Topics Concern  . Not on file  Social History Narrative  . Not on file    Past Surgical History:  Procedure Laterality Date  . ABDOMINAL HYSTERECTOMY  1993  . adenosin cardiolite  07/2005   (+) wall motion abnormality  . AXILLARY LYMPH NODE DISSECTION  2003   squamous cell cancer  . BACK SURGERY    . CARDIAC CATHETERIZATION  ? 2005 / 2007  . CT abdomen and pelvis  02/2010   Same  .  ESOPHAGOGASTRODUODENOSCOPY  2005   GERD  . KNEE ARTHROSCOPY  1990's   "right" (03/20/2012)  . LUMBAR FUSION N/A 08/22/2014   Procedure: Right L2-3 and right L1-2 transforaminal lumbar interbody fusion with pedicle screws, rods, sleeves, cages, local bone graft, Vivigen, cancellous chips;  Surgeon: Jessy Oto, MD;  Location: [place];  Service: Orthopedics;  Laterality: N/A;  . LUMBAR LAMINECTOMY/DECOMPRESSION MICRODISCECTOMY N/A 02/04/2014   Procedure: RIGHT L2-3 MICRODISCECTOMY ;  Surgeon: Jessy Oto, MD;  Location: [place];  Service: Orthopedics;  Laterality: N/A;  . MASTECTOMY WITH AXILLARY LYMPH NODE DISSECTION  12/2003   "right" (03/20/2012)  . POSTERIOR CERVICAL FUSION/FORAMINOTOMY  2001  . SHOULDER ARTHROSCOPY W/ ROTATOR CUFF REPAIR  2003   Left  . THYROIDECTOMY  1977   Right  . TOTAL KNEE ARTHROPLASTY  2000   Right  . [place] of abdomen  07/2005   Fatty liver / kidney stones     . aspirin EC  81 mg Oral Daily  . cholecalciferol  5,000 Units Oral BID  . enoxaparin (LOVENOX) injection  40 mg Subcutaneous Q24H  . fluticasone  2 spray Each Nare Daily  . gabapentin  300 mg Oral TID  . hydrochlorothiazide  12.5 mg Oral Daily  . insulin aspart  0-15 Units Subcutaneous TID WC  . insulin aspart  0-5 Units Subcutaneous QHS  . insulin glargine  140 Units Subcutaneous Daily  . levothyroxine  100 mcg Oral QAC breakfast  . losartan  50 mg Oral Daily     Physical Exam: Blood pressure (!) 147/70, pulse (!) 57, temperature (!) 97.4 F (36.3 C), temperature source Oral, resp. rate 19, height 5\' 4"  (1.626 m), weight 195 lb 4.8 oz (88.6 kg), SpO2 100 %.   Affect appropriate Obese black female  HEENT: normal Neck supple with no adenopathy JVP normal no bruits no thyromegaly Lungs clear with no wheezing and good diaphragmatic motion Heart:  S1/S2 no murmur, no rub, gallop or click PMI normal Abdomen: benighn, BS positve, no tenderness, no AAA no bruit.  No HSM or HJR Distal pulses  intact with no bruits No edema Neuro non-focal Skin warm and dry No muscular weakness   Labs:   Lab Results  Component Value Date   WBC 9.2 03/05/2017   HGB 14.3 03/05/2017   HCT 43.8 03/05/2017   MCV 89.4 03/05/2017   PLT 241 03/05/2017    Recent Labs  Lab 03/04/17 2005 03/05/17 0437  NA 137 137  K 4.4 3.5  CL 102 103  CO2 28 26  BUN 20 15  CREATININE 0.79 0.76  CALCIUM 10.7* 9.9  PROT 8.1  --   BILITOT 0.5  --   ALKPHOS 67  --  ALT 28  --   AST 29  --   GLUCOSE 264* 206*   Lab Results  Component Value Date   CKTOTAL 60 09/14/2015   TROPONINI <0.03 03/05/2017    Lab Results  Component Value Date   CHOL 191 06/30/2016   CHOL 201 (H) 05/27/2015   CHOL 162 07/28/2014   Lab Results  Component Value Date   HDL 45 (L) 06/30/2016   HDL 48 05/27/2015   HDL 42 (L) 07/28/2014   Lab Results  Component Value Date   LDLCALC 97 06/30/2016   LDLCALC 122 05/27/2015   LDLCALC 87 07/28/2014   Lab Results  Component Value Date   TRIG 245 (H) 06/30/2016   TRIG 157 (H) 05/27/2015   TRIG 163 (H) 07/28/2014   Lab Results  Component Value Date   CHOLHDL 4.2 06/30/2016   CHOLHDL 4.2 05/27/2015   CHOLHDL 3.9 07/28/2014   No results found for: LDLDIRECT    Radiology: Dg Chest 2 View  Result Date: 03/04/2017 CLINICAL DATA:  66 year old female with chest pain. EXAM: CHEST  2 VIEW COMPARISON:  08/30/2015 FINDINGS: The heart size and mediastinal contours are within normal limits. Both lungs are clear. The visualized skeletal structures are unremarkable. IMPRESSION: No active cardiopulmonary disease. Electronically Signed   By: Kristopher Oppenheim M.D.   On: 03/04/2017 18:24   Ct Angio Chest Pe W Or Wo Contrast  Result Date: 03/05/2017 CLINICAL DATA:  Chest patent.  Positive D-dimer. EXAM: CT ANGIOGRAPHY CHEST WITH CONTRAST TECHNIQUE: Multidetector CT imaging of the chest was performed using the standard protocol during bolus administration of intravenous contrast.  Multiplanar CT image reconstructions and MIPs were obtained to evaluate the vascular anatomy. CONTRAST:  100 mL Omnipaque 300 COMPARISON:  08/30/2015 FINDINGS: Cardiovascular: Good opacification of the central and segmental pulmonary artery's. No focal filling defects. No evidence of significant pulmonary embolus. Small amount of gas in the main pulmonary artery likely results from intravenous injection. Normal heart size. No pericardial effusion. Normal caliber thoracic aorta with scattered calcification. Mediastinum/Nodes: Esophagus is decompressed. No significant lymphadenopathy. Lungs/Pleura: Motion artifact limits examination. There is mild atelectasis in the lung bases. No airspace disease or consolidation. Airways are patent. No pleural effusions. No pneumothorax. Upper Abdomen: No acute abnormality. Musculoskeletal: No chest wall abnormality. No acute or significant osseous findings. Review of the MIP images confirms the above findings. IMPRESSION: 1. No evidence of significant pulmonary embolus. 2. Atelectasis in the lung bases.  No focal consolidation. Aortic Atherosclerosis (ICD10-I70.0). Electronically Signed   By: Lucienne Capers M.D.   On: 03/05/2017 04:28    EKG: SR LAD no acute ST changes    ASSESSMENT AND PLAN:  Chest Pain: atypical r/o no ECG changes Normal myovue 2017 Long standing IDDM have ordered lexiscan myovue for today  IDDM:  Discussed low carb diet.  Target hemoglobin A1c is 6.5 or less.  Continue current medications.  HTN:  Well controlled.  Continue current medications and low sodium Dash type diet.    Thyroid:  On replacement TSH normal in September     Signed: Jenkins Rouge 03/05/2017, 8:46 AM

## 2017-03-06 ENCOUNTER — Observation Stay (HOSPITAL_BASED_OUTPATIENT_CLINIC_OR_DEPARTMENT_OTHER): Payer: Medicare HMO

## 2017-03-06 DIAGNOSIS — R7989 Other specified abnormal findings of blood chemistry: Secondary | ICD-10-CM | POA: Diagnosis not present

## 2017-03-06 DIAGNOSIS — R0789 Other chest pain: Secondary | ICD-10-CM | POA: Diagnosis not present

## 2017-03-06 DIAGNOSIS — R079 Chest pain, unspecified: Secondary | ICD-10-CM

## 2017-03-06 DIAGNOSIS — I5032 Chronic diastolic (congestive) heart failure: Secondary | ICD-10-CM | POA: Diagnosis not present

## 2017-03-06 DIAGNOSIS — Z794 Long term (current) use of insulin: Secondary | ICD-10-CM | POA: Diagnosis not present

## 2017-03-06 DIAGNOSIS — Z853 Personal history of malignant neoplasm of breast: Secondary | ICD-10-CM | POA: Diagnosis not present

## 2017-03-06 DIAGNOSIS — E1142 Type 2 diabetes mellitus with diabetic polyneuropathy: Secondary | ICD-10-CM | POA: Diagnosis not present

## 2017-03-06 DIAGNOSIS — E039 Hypothyroidism, unspecified: Secondary | ICD-10-CM | POA: Diagnosis not present

## 2017-03-06 DIAGNOSIS — Z96651 Presence of right artificial knee joint: Secondary | ICD-10-CM | POA: Diagnosis not present

## 2017-03-06 DIAGNOSIS — E119 Type 2 diabetes mellitus without complications: Secondary | ICD-10-CM | POA: Diagnosis not present

## 2017-03-06 DIAGNOSIS — I110 Hypertensive heart disease with heart failure: Secondary | ICD-10-CM | POA: Diagnosis not present

## 2017-03-06 DIAGNOSIS — R748 Abnormal levels of other serum enzymes: Secondary | ICD-10-CM | POA: Diagnosis not present

## 2017-03-06 LAB — GLUCOSE, CAPILLARY
Glucose-Capillary: 95 mg/dL (ref 65–99)
Glucose-Capillary: 106 mg/dL — ABNORMAL HIGH (ref 65–99)
Glucose-Capillary: 74 mg/dL (ref 65–99)
Glucose-Capillary: 159 mg/dL — ABNORMAL HIGH (ref 65–99)

## 2017-03-06 LAB — NM MYOCAR MULTI W/SPECT W/WALL MOTION / EF
CHL CUP MPHR: 152 {beats}/min
CSEPEDS: 0 s
CSEPHR: 57 %
CSEPPHR: 87 {beats}/min
Estimated workload: 1 METS
Exercise duration (min): 0 min
RPE: 0
Rest HR: 62 {beats}/min

## 2017-03-06 MED ORDER — REGADENOSON 0.4 MG/5ML IV SOLN
INTRAVENOUS | Status: AC
  Administered 2017-03-06: 0.4 mg via INTRAVENOUS
  Filled 2017-03-06: qty 5

## 2017-03-06 MED ORDER — BACLOFEN 10 MG PO TABS: 10.0000 mg | ORAL_TABLET | Freq: Three times a day (TID) | ORAL | Status: DC | PRN

## 2017-03-06 MED ORDER — TECHNETIUM TC 99M TETROFOSMIN IV KIT
10.0000 | PACK | Freq: Once | INTRAVENOUS | Status: AC | PRN
  Administered 2017-03-06: 10 via INTRAVENOUS

## 2017-03-06 MED ORDER — MORPHINE SULFATE (PF) 4 MG/ML IV SOLN
INTRAVENOUS | Status: AC
  Filled 2017-03-06: qty 1

## 2017-03-06 MED ORDER — REGADENOSON 0.4 MG/5ML IV SOLN
0.4000 mg | Freq: Once | INTRAVENOUS | Status: AC
  Administered 2017-03-06: 0.4 mg via INTRAVENOUS
  Filled 2017-03-06: qty 5

## 2017-03-06 MED ORDER — AMINOPHYLLINE 25 MG/ML IV SOLN
INTRAVENOUS | Status: AC
  Administered 2017-03-06: 75 mg via INTRAVENOUS
  Filled 2017-03-06: qty 10

## 2017-03-06 MED ORDER — MORPHINE SULFATE (PF) 2 MG/ML IV SOLN
2.0000 mg | Freq: Once | INTRAVENOUS | Status: AC
  Administered 2017-03-06: 2 mg via INTRAVENOUS

## 2017-03-06 MED ORDER — NITROGLYCERIN 0.4 MG SL SUBL
0.4000 mg | SUBLINGUAL_TABLET | Freq: Once | SUBLINGUAL | Status: AC
Start: 2017-03-06 — End: 2017-03-06
  Administered 2017-03-06: 0.4 mg via SUBLINGUAL

## 2017-03-06 MED ORDER — NITROGLYCERIN 0.4 MG SL SUBL
SUBLINGUAL_TABLET | SUBLINGUAL | Status: AC
  Administered 2017-03-06: 0.4 mg via SUBLINGUAL
  Filled 2017-03-06: qty 1

## 2017-03-06 MED ORDER — POLYETHYLENE GLYCOL 3350 17 G PO PACK
17.0000 g | PACK | Freq: Every day | ORAL | Status: DC
  Administered 2017-03-06 – 2017-03-07 (×2): 17 g via ORAL
  Filled 2017-03-06 (×2): qty 1

## 2017-03-06 MED ORDER — MORPHINE SULFATE (PF) 2 MG/ML IV SOLN: 2.0000 mg | Freq: Once | INTRAVENOUS | Status: DC

## 2017-03-06 MED ORDER — AMINOPHYLLINE 25 MG/ML IV (NUC MED)
75.0000 mg | Freq: Once | INTRAVENOUS | Status: AC
  Administered 2017-03-06: 75 mg via INTRAVENOUS

## 2017-03-06 MED ORDER — GI COCKTAIL ~~LOC~~
30.0000 mL | Freq: Three times a day (TID) | ORAL | Status: DC | PRN
  Administered 2017-03-06: 30 mL via ORAL
  Filled 2017-03-06: qty 30

## 2017-03-06 MED ORDER — TECHNETIUM TC 99M TETROFOSMIN IV KIT
30.0000 | PACK | Freq: Once | INTRAVENOUS | Status: AC | PRN
  Administered 2017-03-06: 30 via INTRAVENOUS

## 2017-03-06 NOTE — Progress Notes (Signed)
[facility] TEAM 1 - Stepdown/ICU TEAM  MARILU RYLANDER  JEH:[id number] DOB: 1951/03/19 DOA: 03/04/2017 PCP: Susy Frizzle, MD    Brief Narrative:  66 y.o. female with a history of HTN, DM, breast cancer status post mastectomy, hypothyroidism, and chronic diastolic CHF who presented to the ED w/ atypical CP for more than a month (pressure-like, constant, worse with movements, radiating to the right arm, and associated with mild dyspnea and nausea).    In the ED EKG noted normal sinus rhythm and chest x-ray was negative for acute cardiopulmonary disease.  Initial troponin was undetectable.  D-dimer was elevated at 0.84 and second troponin was borderline at 0.03.  CTa was not able to be done due to "poor IV access."  Subjective: Chest pain persists with patient having an acute exacerbation during her nuclear medicine stress test.  She reports it is subsiding now.  She denies shortness of breath fevers chills nausea or vomiting.  She tells me she is anxious to be discharged home.  Assessment & Plan:  Chest pain - atypical  No PE on CTa chest - normal myoview July 2017 - Cards repeated myoview today w/ results pending -TTE this admit notes EF 55-60% w/ no WMA but w/ mild LVH - perhaps this is all musculoskeletal in origin - if inpt eval unrevealing will need ongoing outpt eval   LE edema - elevated d-dimer  Venous duplex pending to r/o DVT   DM A1c 9.12 December 2016 - CBG well controlled   HTN BP quite variable - present elevation likely due to pain - follow w/o change in med tx for now   Chronic diastolic CHF No comment on DD on TTE this admit - no evidence of gross overload on exam   Hypothyroidism  Cont home synthroid dose  DVT prophylaxis: lovenox  Code Status: FULL CODE Family Communication: no family present at time of exam  Disposition Plan: awaiting results of LE venous duplex and NM stress test to determine disposition   Consultants:  Cardiology   Antimicrobials:    none  Objective: Blood pressure (!) 151/68, pulse (!) 59, temperature 98.7 F (37.1 C), temperature source Oral, resp. rate 14, height 5\' 4"  (1.626 m), weight 87.8 kg (193 lb 9.6 oz), SpO2 92 %.  Intake/Output Summary (Last 24 hours) at 03/06/2017 1157 Last data filed at 03/06/2017 0813 Gross per 24 hour  Intake 0 ml  Output -  Net 0 ml   Filed Weights   03/04/17 1718 03/05/17 0146 03/06/17 0611  Weight: 94.8 kg (209 lb) 88.6 kg (195 lb 4.8 oz) 87.8 kg (193 lb 9.6 oz)    Examination: General: No acute respiratory distress Lungs: Clear to auscultation bilaterally without wheezes or crackles Cardiovascular: Regular rate and rhythm without murmur gallop or rub normal S1 and S2 Abdomen: Nontender, nondistended, soft, bowel sounds positive, no rebound, no ascites, no appreciable mass Extremities: No significant cyanosis, clubbing, or edema bilateral lower extremities   CBC: Recent Labs  Lab 03/04/17 2005 03/05/17 0437  WBC 7.1 9.2  HGB 15.0 14.3  HCT 46.0 43.8  MCV 88.8 89.4  PLT 239 378   Basic Metabolic Panel: Recent Labs  Lab 03/04/17 2005 03/05/17 0437  NA 137 137  K 4.4 3.5  CL 102 103  CO2 28 26  GLUCOSE 264* 206*  BUN 20 15  CREATININE 0.79 0.76  CALCIUM 10.7* 9.9   GFR: Estimated Creatinine Clearance: 72.1 mL/min (by C-G formula based on SCr of 0.76 mg/dL).  Liver Function Tests: Recent Labs  Lab 03/04/17 2005  AST 29  ALT 28  ALKPHOS 67  BILITOT 0.5  PROT 8.1  ALBUMIN 4.0   Recent Labs  Lab 03/04/17 2005  LIPASE 45    Cardiac Enzymes: Recent Labs  Lab 03/04/17 2005 03/04/17 2240 03/05/17 0437 03/05/17 0757 03/05/17 1627  TROPONINI <0.03 0.03* <0.03 <0.03 <0.03    HbA1C: Hemoglobin A1C  Date/Time Value Ref Range Status  09/07/2016 11:23 AM 11.3  Final   Hgb A1c MFr Bld  Date/Time Value Ref Range Status  12/19/2016 12:25 PM 9.3 (H) <5.7 % of total Hgb Final    Comment:    For someone without known diabetes, a hemoglobin  A1c value of 6.5% or greater indicates that they may have  diabetes and this should be confirmed with a follow-up  test. . For someone with known diabetes, a value <7% indicates  that their diabetes is well controlled and a value  greater than or equal to 7% indicates suboptimal  control. A1c targets should be individualized based on  duration of diabetes, age, comorbid conditions, and  other considerations. . Currently, no consensus exists regarding use of hemoglobin A1c for diagnosis of diabetes for children. [name] Kitchen   06/30/2016 09:44 AM 11.6 (H) <5.7 % Final    Comment:      For someone without known diabetes, a hemoglobin A1c value of 6.5% or greater indicates that they may have diabetes and this should be confirmed with a follow-up test.   For someone with known diabetes, a value <7% indicates that their diabetes is well controlled and a value greater than or equal to 7% indicates suboptimal control. A1c targets should be individualized based on duration of diabetes, age, comorbid conditions, and other considerations.   Currently, no consensus exists for use of hemoglobin A1c for diagnosis of diabetes for children.       CBG: Recent Labs  Lab 03/05/17 1128 03/05/17 1649 03/05/17 2230 03/06/17 0747 03/06/17 1048  GLUCAP 205* 150* 141* 95 106*    Recent Results (from the past 240 hour(s))  MRSA PCR Screening     Status: None   Collection Time: 03/05/17  2:26 AM  Result Value Ref Range Status   MRSA by PCR NEGATIVE NEGATIVE Final    Comment:        The GeneXpert MRSA Assay (FDA approved for NASAL specimens only), is one component of a comprehensive MRSA colonization surveillance program. It is not intended to diagnose MRSA infection nor to guide or monitor treatment for MRSA infections.      Scheduled Meds: . aspirin EC  81 mg Oral Daily  . cholecalciferol  5,000 Units Oral BID  . enoxaparin (LOVENOX) injection  40 mg Subcutaneous Q24H  . fluticasone   2 spray Each Nare Daily  . gabapentin  300 mg Oral TID  . hydrochlorothiazide  12.5 mg Oral Daily  . insulin aspart  0-15 Units Subcutaneous TID WC  . insulin aspart  0-5 Units Subcutaneous QHS  . insulin glargine  80 Units Subcutaneous Daily  . levothyroxine  100 mcg Oral QAC breakfast  . losartan  50 mg Oral Daily     LOS: 0 days   Cherene Altes, MD [facility] Office  [phone] Pager - Text Page per Amion as per below:  On-Call/Text Page:      [email or web address]      password [email or web address]  If 7PM-7AM, please contact night-coverage [email or web address] Password [facility] 03/06/2017, 11:57 AM

## 2017-03-06 NOTE — Progress Notes (Signed)
Patient had 5/10 chest pressure with Lexiscan. 2/10 chest pressure prior to starting. Serita Butcher NP present. VS stable. No diaphoresis.  Given Aminopylline 75mg , nitro 0.4mg  sl, Morphine 2mg  IV twice. Chest pressure remained unchanged except at 1246 also throught to her back. No EKG changes. I talked to her nurse on 6700 and let her know about the pain and meds. Chest pressure now 3/10.

## 2017-03-06 NOTE — Progress Notes (Signed)
Progress Note  Patient Name: [name] Date of Encounter: 03/06/2017  Primary Cardiologist: [place] hurting "all over", today mostly bothered by pain in her right arm.. Nuclear stress test was not performed yesterday due to eating breakfast.  Inpatient Medications    Scheduled Meds: . aspirin EC  81 mg Oral Daily  . cholecalciferol  5,000 Units Oral BID  . enoxaparin (LOVENOX) injection  40 mg Subcutaneous Q24H  . fluticasone  2 spray Each Nare Daily  . gabapentin  300 mg Oral TID  . hydrochlorothiazide  12.5 mg Oral Daily  . insulin aspart  0-15 Units Subcutaneous TID WC  . insulin aspart  0-5 Units Subcutaneous QHS  . insulin glargine  80 Units Subcutaneous Daily  . levothyroxine  100 mcg Oral QAC breakfast  . losartan  50 mg Oral Daily   Continuous Infusions:  PRN Meds: acetaminophen, cyclobenzaprine, iopamidol, meclizine, metoprolol tartrate, morphine injection, nitroGLYCERIN, ondansetron (ZOFRAN) IV   Vital Signs    Vitals:   03/05/17 2059 03/06/17 0011 03/06/17 0611 03/06/17 0751  BP: 137/69 121/61 121/76 132/88  Pulse: (!) 58 60 (!) 54 64  Resp: 16 18 14    Temp: 98.2 F (36.8 C) 98 F (36.7 C) 98.1 F (36.7 C) 98.3 F (36.8 C)  TempSrc: Oral Oral Oral Oral  SpO2: 97% 98% 94% 96%  Weight:   193 lb 9.6 oz (87.8 kg)   Height:        Intake/Output Summary (Last 24 hours) at 03/06/2017 0904 Last data filed at 03/06/2017 0813 Gross per 24 hour  Intake 0 ml  Output 500 ml  Net -500 ml   Filed Weights   03/04/17 1718 03/05/17 0146 03/06/17 0611  Weight: 209 lb (94.8 kg) 195 lb 4.8 oz (88.6 kg) 193 lb 9.6 oz (87.8 kg)    Telemetry    Sinus rhythm, mild sinus bradycardia at night- Personally Reviewed  ECG    Normal sinus rhythm, normal tracing- Personally Reviewed  Physical Exam  Overweight/mildly obese GEN: No acute distress.   Neck: No JVD Cardiac: RRR, no murmurs, rubs, or gallops.  Respiratory: Clear to  auscultation bilaterally. GI: Soft, nontender, non-distended  MS: No edema; No deformity. Neuro:  Nonfocal  Psych: Normal affect   Labs    Chemistry Recent Labs  Lab 03/04/17 2005 03/05/17 0437  NA 137 137  K 4.4 3.5  CL 102 103  CO2 28 26  GLUCOSE 264* 206*  BUN 20 15  CREATININE 0.79 0.76  CALCIUM 10.7* 9.9  PROT 8.1  --   ALBUMIN 4.0  --   AST 29  --   ALT 28  --   ALKPHOS 67  --   BILITOT 0.5  --   GFRNONAA >60 >60  GFRAA >60 >60  ANIONGAP 7 8     Hematology Recent Labs  Lab 03/04/17 2005 03/05/17 0437  WBC 7.1 9.2  RBC 5.18* 4.90  HGB 15.0 14.3  HCT 46.0 43.8  MCV 88.8 89.4  MCH 29.0 29.2  MCHC 32.6 32.6  RDW 13.9 13.8  PLT 239 241    Cardiac Enzymes Recent Labs  Lab 03/04/17 2240 03/05/17 0437 03/05/17 0757 03/05/17 1627  TROPONINI 0.03* <0.03 <0.03 <0.03   No results for input(s): TROPIPOC in the last 168 hours.   BNP Recent Labs  Lab 03/04/17 2005  BNP 42.9     DDimer  Recent Labs  Lab 03/04/17 2005  DDIMER 0.84*  Radiology    Dg Chest 2 View  Result Date: 03/04/2017 CLINICAL DATA:  66 year old female with chest pain. EXAM: CHEST  2 VIEW COMPARISON:  08/30/2015 FINDINGS: The heart size and mediastinal contours are within normal limits. Both lungs are clear. The visualized skeletal structures are unremarkable. IMPRESSION: No active cardiopulmonary disease. Electronically Signed   By: Kristopher Oppenheim M.D.   On: 03/04/2017 18:24   Ct Angio Chest Pe W Or Wo Contrast  Result Date: 03/05/2017 CLINICAL DATA:  Chest patent.  Positive D-dimer. EXAM: CT ANGIOGRAPHY CHEST WITH CONTRAST TECHNIQUE: Multidetector CT imaging of the chest was performed using the standard protocol during bolus administration of intravenous contrast. Multiplanar CT image reconstructions and MIPs were obtained to evaluate the vascular anatomy. CONTRAST:  100 mL Omnipaque 300 COMPARISON:  08/30/2015 FINDINGS: Cardiovascular: Good opacification of the central and  segmental pulmonary artery's. No focal filling defects. No evidence of significant pulmonary embolus. Small amount of gas in the main pulmonary artery likely results from intravenous injection. Normal heart size. No pericardial effusion. Normal caliber thoracic aorta with scattered calcification. Mediastinum/Nodes: Esophagus is decompressed. No significant lymphadenopathy. Lungs/Pleura: Motion artifact limits examination. There is mild atelectasis in the lung bases. No airspace disease or consolidation. Airways are patent. No pleural effusions. No pneumothorax. Upper Abdomen: No acute abnormality. Musculoskeletal: No chest wall abnormality. No acute or significant osseous findings. Review of the MIP images confirms the above findings. IMPRESSION: 1. No evidence of significant pulmonary embolus. 2. Atelectasis in the lung bases.  No focal consolidation. Aortic Atherosclerosis (ICD10-I70.0). Electronically Signed   By: Lucienne Capers M.D.   On: 03/05/2017 04:28    Cardiac Studies   - Left ventricle: The cavity size was normal. Wall thickness was   increased in a pattern of mild LVH. Systolic function was normal.   The estimated ejection fraction was in the range of 55% to 60%.   Wall motion was normal; there were no regional wall motion   abnormalities. - Aortic valve: There was trivial regurgitation. - Mitral valve: Calcified annulus. Mildly thickened leaflets . - Left atrium: The atrium was mildly dilated. - Atrial septum: No defect or patent foramen ovale was identified.  Patient Profile     66 y.o. female with DM, HTN, chronic diastolic HF with persistent and varying types of chest pain, normal ECG, echo, CT angio and biomarkers, waiting for stress test today.  Assessment & Plan    Low level of suspicion for acute coronary sd or for a cardiac cause of her symptoms. If stress test is normal, do not plan additional inpatient cardiac evaluation. Note minimal scattered atherosclerotic  calcification in the descending thoracic aorta, no visible calcification in the coronaries.  For questions or updates, please contact [place] Please consult [email or web address] for contact info under Cardiology/STEMI.      Signed, Sanda Klein, MD  03/06/2017, 9:04 AM

## 2017-03-06 NOTE — Progress Notes (Signed)
Reviewed the nuclear study. The perfusion images are not of optimal quality, but are interpretable. There are no reversible defects (i.e. no ischemia). The lateral/inferolateral perfusion abnormalities described in the report are mild and are worse on rest, rather than stress images. This is consistent with artifact. There are no corresponding regional wall motion abnormalities on gated images (or on the echo). The reported area of hypokinesia is in the inferior wall, different from the perfusion "defects". The nuclear study wall motion and reported low LVEF are impacted by arrhythmia (PVCs noted during the study). They are less reliable than the echo findings (which showed normal wall motion and EF 55-60%): gated reconstructed motion versus live echo images.  All in all I believe this is a low risk study. Direct comparison with 2017 shows little change. No plan for further cardiac evaluation.  Sanda Klein, MD, [facility] [phone] office [phone] pager

## 2017-03-06 NOTE — Progress Notes (Addendum)
lexiscan myoview completed.  + chest pressure.up to 9/44.  no complications. Her pain in chest did not improve with aminophylline  So NTG given,  Bp was elevated.  NTG with no relief so morphine 2 mg IV given..  Pain is like it is at home and goes to back.  CTA of chest yesterday without acute dissection.  We repeated the 2 mg morphine.  She is more comfortable but not relieved.  Dr. Sallyanne Kuster is aware.

## 2017-03-07 ENCOUNTER — Observation Stay (HOSPITAL_BASED_OUTPATIENT_CLINIC_OR_DEPARTMENT_OTHER): Payer: Medicare HMO

## 2017-03-07 ENCOUNTER — Other Ambulatory Visit: Payer: Self-pay | Admitting: Family Medicine

## 2017-03-07 DIAGNOSIS — M7989 Other specified soft tissue disorders: Secondary | ICD-10-CM

## 2017-03-07 DIAGNOSIS — R7989 Other specified abnormal findings of blood chemistry: Secondary | ICD-10-CM

## 2017-03-07 DIAGNOSIS — E118 Type 2 diabetes mellitus with unspecified complications: Secondary | ICD-10-CM

## 2017-03-07 DIAGNOSIS — E1165 Type 2 diabetes mellitus with hyperglycemia: Secondary | ICD-10-CM | POA: Diagnosis not present

## 2017-03-07 DIAGNOSIS — E1122 Type 2 diabetes mellitus with diabetic chronic kidney disease: Secondary | ICD-10-CM

## 2017-03-07 DIAGNOSIS — I5032 Chronic diastolic (congestive) heart failure: Secondary | ICD-10-CM | POA: Diagnosis not present

## 2017-03-07 DIAGNOSIS — R600 Localized edema: Secondary | ICD-10-CM | POA: Diagnosis not present

## 2017-03-07 DIAGNOSIS — M79609 Pain in unspecified limb: Secondary | ICD-10-CM | POA: Diagnosis not present

## 2017-03-07 DIAGNOSIS — E7849 Other hyperlipidemia: Secondary | ICD-10-CM

## 2017-03-07 DIAGNOSIS — E038 Other specified hypothyroidism: Secondary | ICD-10-CM | POA: Diagnosis not present

## 2017-03-07 DIAGNOSIS — I10 Essential (primary) hypertension: Secondary | ICD-10-CM

## 2017-03-07 DIAGNOSIS — IMO0002 Reserved for concepts with insufficient information to code with codable children: Secondary | ICD-10-CM

## 2017-03-07 LAB — GLUCOSE, CAPILLARY
GLUCOSE-CAPILLARY: 112 mg/dL — AB (ref 65–99)
Glucose-Capillary: 72 mg/dL (ref 65–99)
Glucose-Capillary: 139 mg/dL — ABNORMAL HIGH (ref 65–99)

## 2017-03-07 LAB — BASIC METABOLIC PANEL
ANION GAP: 8 (ref 5–15)
BUN: 17 mg/dL (ref 6–20)
CHLORIDE: 102 mmol/L (ref 101–111)
CO2: 28 mmol/L (ref 22–32)
Calcium: 10.3 mg/dL (ref 8.9–10.3)
Creatinine, Ser: 0.91 mg/dL (ref 0.44–1.00)
GFR calc Af Amer: >60 mL/min (ref >60)
GLUCOSE: 77 mg/dL (ref 65–99)
POTASSIUM: 3.7 mmol/L (ref 3.5–5.1)
Sodium: 138 mmol/L (ref 135–145)

## 2017-03-07 MED ORDER — GABAPENTIN 300 MG PO CAPS: 300.0000 mg | ORAL_CAPSULE | Freq: Three times a day (TID) | ORAL | 3 refills | Status: DC

## 2017-03-07 MED ORDER — HYDROCHLOROTHIAZIDE 12.5 MG PO CAPS: 12.5000 mg | ORAL_CAPSULE | Freq: Every day | ORAL | 0 refills | Status: DC

## 2017-03-07 MED ORDER — LOSARTAN POTASSIUM 50 MG PO TABS: 50.0000 mg | ORAL_TABLET | Freq: Every day | ORAL | 0 refills | Status: DC

## 2017-03-07 MED ORDER — INSULIN GLARGINE 300 UNIT/ML ~~LOC~~ SOPN: 80.0000 [IU] | PEN_INJECTOR | Freq: Every day | SUBCUTANEOUS | 0 refills | Status: DC

## 2017-03-07 NOTE — Progress Notes (Signed)
Preliminary results by tech - Venous Duplex Lower Ext. Completed. Negative for deep and superficial vein thrombosis. Velinda Wrobel, BS, RDMS, RVT  

## 2017-03-07 NOTE — Care Management Obs Status (Signed)
[facility] NOTIFICATION   Patient Details  Name: [name] MRN: [id number] Date of Birth: [birth year]   Medicare Observation Status Notification Given:  Yes    Carles Collet, RN 03/07/2017, 8:44 AM

## 2017-03-07 NOTE — Progress Notes (Signed)
Discharge instructions reviewed with pt. Pt has no questions at this time. Prescriptions given to pt. IV d/c. Pt denies any pain and stated she is ready for d/c.

## 2017-03-07 NOTE — Discharge Summary (Signed)
Physician Discharge Summary  [name] ZDG:[id number] DOB: 1950-08-22 DOA: 03/04/2017  PCP: Susy Frizzle, MD  Admit date: 03/04/2017 Discharge date: 03/07/2017  Time spent: 35 minutes  Recommendations for Outpatient Follow-up:  Atypical Chest Pain -does not appear to be cardiac in nature. Per cardiology's note no further cardiology workup recommended. -Patient has not controlled her diabetes or cholesterol. Has been counseled on sequela of continued uncontrolled diabetes and cholesterol to include MI, stroke -See follow-up recommendations below   LE edema -elevated d-dimer -11/27 lower extremity Doppler negative for DVT/SVT   Diabetes Type 2 uncontrolled with complication -September 9518 Hemoglobin A1c= 9.3  -schedule follow-up appointment with Dr. Jenna Luo in 1-2 weeks uncontrolled diabetes, HTN, chronic diastolic CHF   HLD -Lipid panel not within ADA/AHA guidelines -unfortunately patient had been on Lipitor but per Dr. Allegra Lai cardiology note 10/27/2015 patient Lipitor discontinued secondary to body aches and pains.  -Given patient's continued CP and risk factors may want to retry on Crestor will leave up to cardiologist and PCP to decide when/if to restart    Essential HTN     Chronic diastolic CHF No comment on DD on TTE this admit - no evidence of gross overload on exam  -schedule follow-up in 4-6 weeks Dr. Allegra Lai Cardiology chronic diastolic CHF,Essential HTN   Hypothyroidism  Cont home synthroid dose    Discharge Diagnoses:  Principal Problem:   Chest pain Active Problems:   Hypothyroidism   Diabetes with neurologic complications ([place])   Chronic diastolic heart failure ([facility])   Positive D dimer   Elevated d-dimer   Elevated troponin    Discharge Condition: stable  Diet recommendation: heart healthy/American diabetic Association  Filed Weights   03/05/17 0146 03/06/17 0611 03/07/17 0412  Weight: 195 lb 4.8 oz (88.6 kg) 193  lb 9.6 oz (87.8 kg) 192 lb 12.8 oz (87.5 kg)    History of present illness:  66 y.o. female with a history of HTN, DM, breast cancer status post mastectomy, hypothyroidism, and chronic diastolic CHF who presented to the ED w/ atypical CP for more than a month (pressure-like, constant, worse with movements, radiating to the right arm, and associated with mild dyspnea and nausea).     In the ED EKG noted normal sinus rhythm and chest x-ray was negative for acute cardiopulmonary disease.  Initial troponin was undetectable.  D-dimer was elevated at 0.84 and second troponin was borderline at 0.03.  CTa was not able to be done due to "poor IV access." Patient with atypical chest pain seen by cardiology deemed no further workup required. Will follow up with cardiology see recommendations above.   Procedures 11/25 Echocardiogram:Left ventricle: mild LVH. -LVEF =-55% to 60%. Negative WMA   Consultations: Cardiology    Discharge Exam: Vitals:   03/07/17 0747 03/07/17 1000 03/07/17 1118 03/07/17 1400  BP: 137/70 134/80 136/83 (!) 144/72  Pulse: (!) 51 72 98 92  Resp:   18 18  Temp: 97.6 F (36.4 C) 97.8 F (36.6 C) 98 F (36.7 C) 98 F (36.7 C)  TempSrc: Oral Oral Oral Oral  SpO2: 95% 95% 95% 95%  Weight:      Height:        General: a/O 4, negative acute respiratory distress Cardiovascular: regular rhythm and rate, negative murmurs rubs or gallops, normal S1/S2 Respiratory: clear to auscultation bilateral  Discharge Instructions   Allergies as of 03/07/2017      Reactions   Dilaudid [hydromorphone Hcl] Nausea And Vomiting  Lipitor [atorvastatin] Other (See Comments)   Body & Muscle Aches   Adhesive [tape] Rash   Ampicillin Rash   REACTION: Rash and welts on her hands when she took it in the hospital   Penicillins Rash      Medication List    STOP taking these medications   furosemide 40 MG tablet Commonly known as:  LASIX   losartan-hydrochlorothiazide 50-12.5 MG  tablet Commonly known as:  HYZAAR   naproxen sodium 220 MG tablet Commonly known as:  ALEVE   polyethylene glycol packet Commonly known as:  MIRALAX / GLYCOLAX     TAKE these medications   acetaminophen 500 MG tablet Commonly known as:  TYLENOL Take 500 mg by mouth every 4 (four) hours as needed for mild pain or headache.   aspirin 81 MG tablet Take 81 mg by mouth daily.   baclofen 10 MG tablet Commonly known as:  LIORESAL Take 10 mg by mouth 3 (three) times daily as needed for muscle spasms.   Blood Glucose System Pak Kit Use as directed to monitor FSBS 3x daily. Dx: E11.9. Please dispense [employer] True Metrix.   BLOOD GLUCOSE TEST STRIPS Strp Use as directed to monitor FSBS 3x daily. Dx: E11.9. Please dispense [employer] True Metrix.   cyclobenzaprine 10 MG tablet Commonly known as:  FLEXERIL Take 1 tablet (10 mg total) by mouth 3 (three) times daily as needed for muscle spasms.   Diclofenac-Misoprostol 50-0.2 MG Tbec Take 1 tablet by mouth 3 (three) times daily after meals.   fluticasone 50 MCG/ACT nasal spray Commonly known as:  FLONASE Place 2 sprays into both nostrils daily.   gabapentin 300 MG capsule Commonly known as:  NEURONTIN Take 1 capsule (300 mg total) by mouth 3 (three) times daily.   hydrochlorothiazide 12.5 MG capsule Commonly known as:  MICROZIDE Take 1 capsule (12.5 mg total) by mouth daily. Start taking on:  03/08/2017   Insulin Glargine 300 UNIT/ML Sopn Commonly known as:  TOUJEO SOLOSTAR Inject 80 Units into the skin at bedtime.   Lancet Devices Misc Use as directed to monitor FSBS 3x daily. Dx: E11.9. Please dispense TruePlus Ultra Thin.   Lancets Misc Use as directed to monitor FSBS 3x daily. Dx: E11.9. Please dispense TruePlus Ultra Thin.   LAXATIVE FORMULA Tabs Take 1 tablet by mouth as needed (for BM).   levothyroxine 100 MCG tablet Commonly known as:  SYNTHROID, LEVOTHROID Take 1 tablet (100 mcg total) by mouth daily.   losartan  50 MG tablet Commonly known as:  COZAAR Take 1 tablet (50 mg total) by mouth daily. Start taking on:  03/08/2017   meclizine 12.5 MG tablet Commonly known as:  ANTIVERT Take 1 tablet (12.5 mg total) by mouth 3 (three) times daily as needed for dizziness.   metFORMIN 850 MG tablet Commonly known as:  GLUCOPHAGE Take 1 tablet (850 mg total) by mouth 2 (two) times daily with a meal.   Pen Needles 5/16" 30G X 8 MM Misc Use to inject insulin 5x daily.   ranitidine 150 MG capsule Commonly known as:  ZANTAC Take 150 mg by mouth 2 (two) times daily.   TRUE METRIX LEVEL 2 Normal Soln Use as directed.   Vitamin D3 5000 units Caps Take 5,000 Units by mouth 2 (two) times daily.      Allergies  Allergen Reactions  . Dilaudid [Hydromorphone Hcl] Nausea And Vomiting  . Lipitor [Atorvastatin] Other (See Comments)    Body & Muscle Aches  . Adhesive [Tape]  Rash  . Ampicillin Rash    REACTION: Rash and welts on her hands when she took it in the hospital  . Penicillins Rash   Follow-up Information    Susy Frizzle, MD. Go on 03/17/2017.   Specialty:  Family Medicine Why:  at 4:00pm Contact information: [address] [place] [address] [phone]        [name], MD. Go on 04/26/2017.   Specialty:  Cardiology Why:  at 9:45am Contact information: [place] [address] [phone]            The results of significant diagnostics from this hospitalization (including imaging, microbiology, ancillary and laboratory) are listed below for reference.    Significant Diagnostic Studies: Dg Chest 2 View  Result Date: 03/04/2017 CLINICAL DATA:  66 year old female with chest pain. EXAM: CHEST  2 VIEW COMPARISON:  08/30/2015 FINDINGS: The heart size and mediastinal contours are within normal limits. Both lungs are clear. The visualized skeletal structures are unremarkable. IMPRESSION: No active cardiopulmonary disease.  Electronically Signed   By: Kristopher Oppenheim M.D.   On: 03/04/2017 18:24   Ct Angio Chest Pe W Or Wo Contrast  Result Date: 03/05/2017 CLINICAL DATA:  Chest patent.  Positive D-dimer. EXAM: CT ANGIOGRAPHY CHEST WITH CONTRAST TECHNIQUE: Multidetector CT imaging of the chest was performed using the standard protocol during bolus administration of intravenous contrast. Multiplanar CT image reconstructions and MIPs were obtained to evaluate the vascular anatomy. CONTRAST:  100 mL Omnipaque 300 COMPARISON:  08/30/2015 FINDINGS: Cardiovascular: Good opacification of the central and segmental pulmonary artery's. No focal filling defects. No evidence of significant pulmonary embolus. Small amount of gas in the main pulmonary artery likely results from intravenous injection. Normal heart size. No pericardial effusion. Normal caliber thoracic aorta with scattered calcification. Mediastinum/Nodes: Esophagus is decompressed. No significant lymphadenopathy. Lungs/Pleura: Motion artifact limits examination. There is mild atelectasis in the lung bases. No airspace disease or consolidation. Airways are patent. No pleural effusions. No pneumothorax. Upper Abdomen: No acute abnormality. Musculoskeletal: No chest wall abnormality. No acute or significant osseous findings. Review of the MIP images confirms the above findings. IMPRESSION: 1. No evidence of significant pulmonary embolus. 2. Atelectasis in the lung bases.  No focal consolidation. Aortic Atherosclerosis (ICD10-I70.0). Electronically Signed   By: Lucienne Capers M.D.   On: 03/05/2017 04:28   Nm Myocar Multi W/spect W/wall Motion / Ef  Result Date: 03/06/2017 CLINICAL DATA:  66 year old with chest pain. Low probability for coronary artery disease. EXAM: MYOCARDIAL IMAGING WITH SPECT (REST AND PHARMACOLOGIC-STRESS) GATED LEFT VENTRICULAR WALL MOTION STUDY LEFT VENTRICULAR EJECTION FRACTION TECHNIQUE: Standard myocardial SPECT imaging was performed after resting  intravenous injection of 10 mCi Tc-47mtetrofosmin. Subsequently, intravenous infusion of Lexiscan was performed under the supervision of the Cardiology staff. At peak effect of the drug, 30 mCi Tc-929metrofosmin was injected intravenously and standard myocardial SPECT imaging was performed. Quantitative gated imaging was also performed to evaluate left ventricular wall motion, and estimate left ventricular ejection fraction. COMPARISON:  None. FINDINGS: Perfusion: No decreased activity in the left ventricle on stress imaging to suggest reversible ischemia. Decreased uptake along the lateral wall and inferolateral wall on both the rest and stress images. Wall Motion: Mild diffuse hypokinesia. Mild to moderate hypokinesia along the inferior wall. Left Ventricular Ejection Fraction: 47 % End diastolic volume 94 ml End systolic volume 50 ml IMPRESSION: 1. No reversible ischemia. Difficult to exclude fixed defects and infarcts involving the  lateral and inferolateral wall. 2. Mild hypokinesia, particularly along the inferior wall. 3. Left ventricular ejection fraction is 47%. 4. Non invasive risk stratification*: Intermediate *2012 Appropriate Use Criteria for Coronary Revascularization Focused Update: J Am Coll Cardiol. 5329;92(4):268-341. http://content.airportbarriers.com.aspx?articleid=[email or web address] Electronically Signed   By: Markus Daft M.D.   On: 03/06/2017 17:08    Microbiology: Recent Results (from the past 240 hour(s))  MRSA PCR Screening     Status: None   Collection Time: 03/05/17  2:26 AM  Result Value Ref Range Status   MRSA by PCR NEGATIVE NEGATIVE Final    Comment:        The GeneXpert MRSA Assay (FDA approved for NASAL specimens only), is one component of a comprehensive MRSA colonization surveillance program. It is not intended to diagnose MRSA infection nor to guide or monitor treatment for MRSA infections.      Labs: Basic Metabolic Panel: Recent Labs  Lab 03/04/17 2005  03/05/17 0437 03/07/17 0536  NA 137 137 138  K 4.4 3.5 3.7  CL 102 103 102  CO2 '28 26 28  ' GLUCOSE 264* 206* 77  BUN '20 15 17  ' CREATININE 0.79 0.76 0.91  CALCIUM 10.7* 9.9 10.3   Liver Function Tests: Recent Labs  Lab 03/04/17 2005  AST 29  ALT 28  ALKPHOS 67  BILITOT 0.5  PROT 8.1  ALBUMIN 4.0   Recent Labs  Lab 03/04/17 2005  LIPASE 45   No results for input(s): AMMONIA in the last 168 hours. CBC: Recent Labs  Lab 03/04/17 2005 03/05/17 0437  WBC 7.1 9.2  HGB 15.0 14.3  HCT 46.0 43.8  MCV 88.8 89.4  PLT 239 241   Cardiac Enzymes: Recent Labs  Lab 03/04/17 2005 03/04/17 2240 03/05/17 0437 03/05/17 0757 03/05/17 1627  TROPONINI <0.03 0.03* <0.03 <0.03 <0.03   BNP: BNP (last 3 results) Recent Labs    10/03/16 1419 03/04/17 2005  BNP 43.9 42.9    ProBNP (last 3 results) No results for input(s): PROBNP in the last 8760 hours.  CBG: Recent Labs  Lab 03/06/17 1612 03/06/17 2141 03/07/17 0746 03/07/17 1115 03/07/17 1635  GLUCAP 74 159* 72 139* 112*       Signed:  Dia Crawford, MD [facility] [phone] pager

## 2017-03-09 IMAGING — DX DG CHEST 2V
2 series · 2 of 2 positions shown · non-contrast
Comparison: Chest radiograph 08/22/2014

CLINICAL DATA: Patient with shortness of breath and chest pain for
1 day.

EXAM:
CHEST  2 VIEW

[w chest lat]
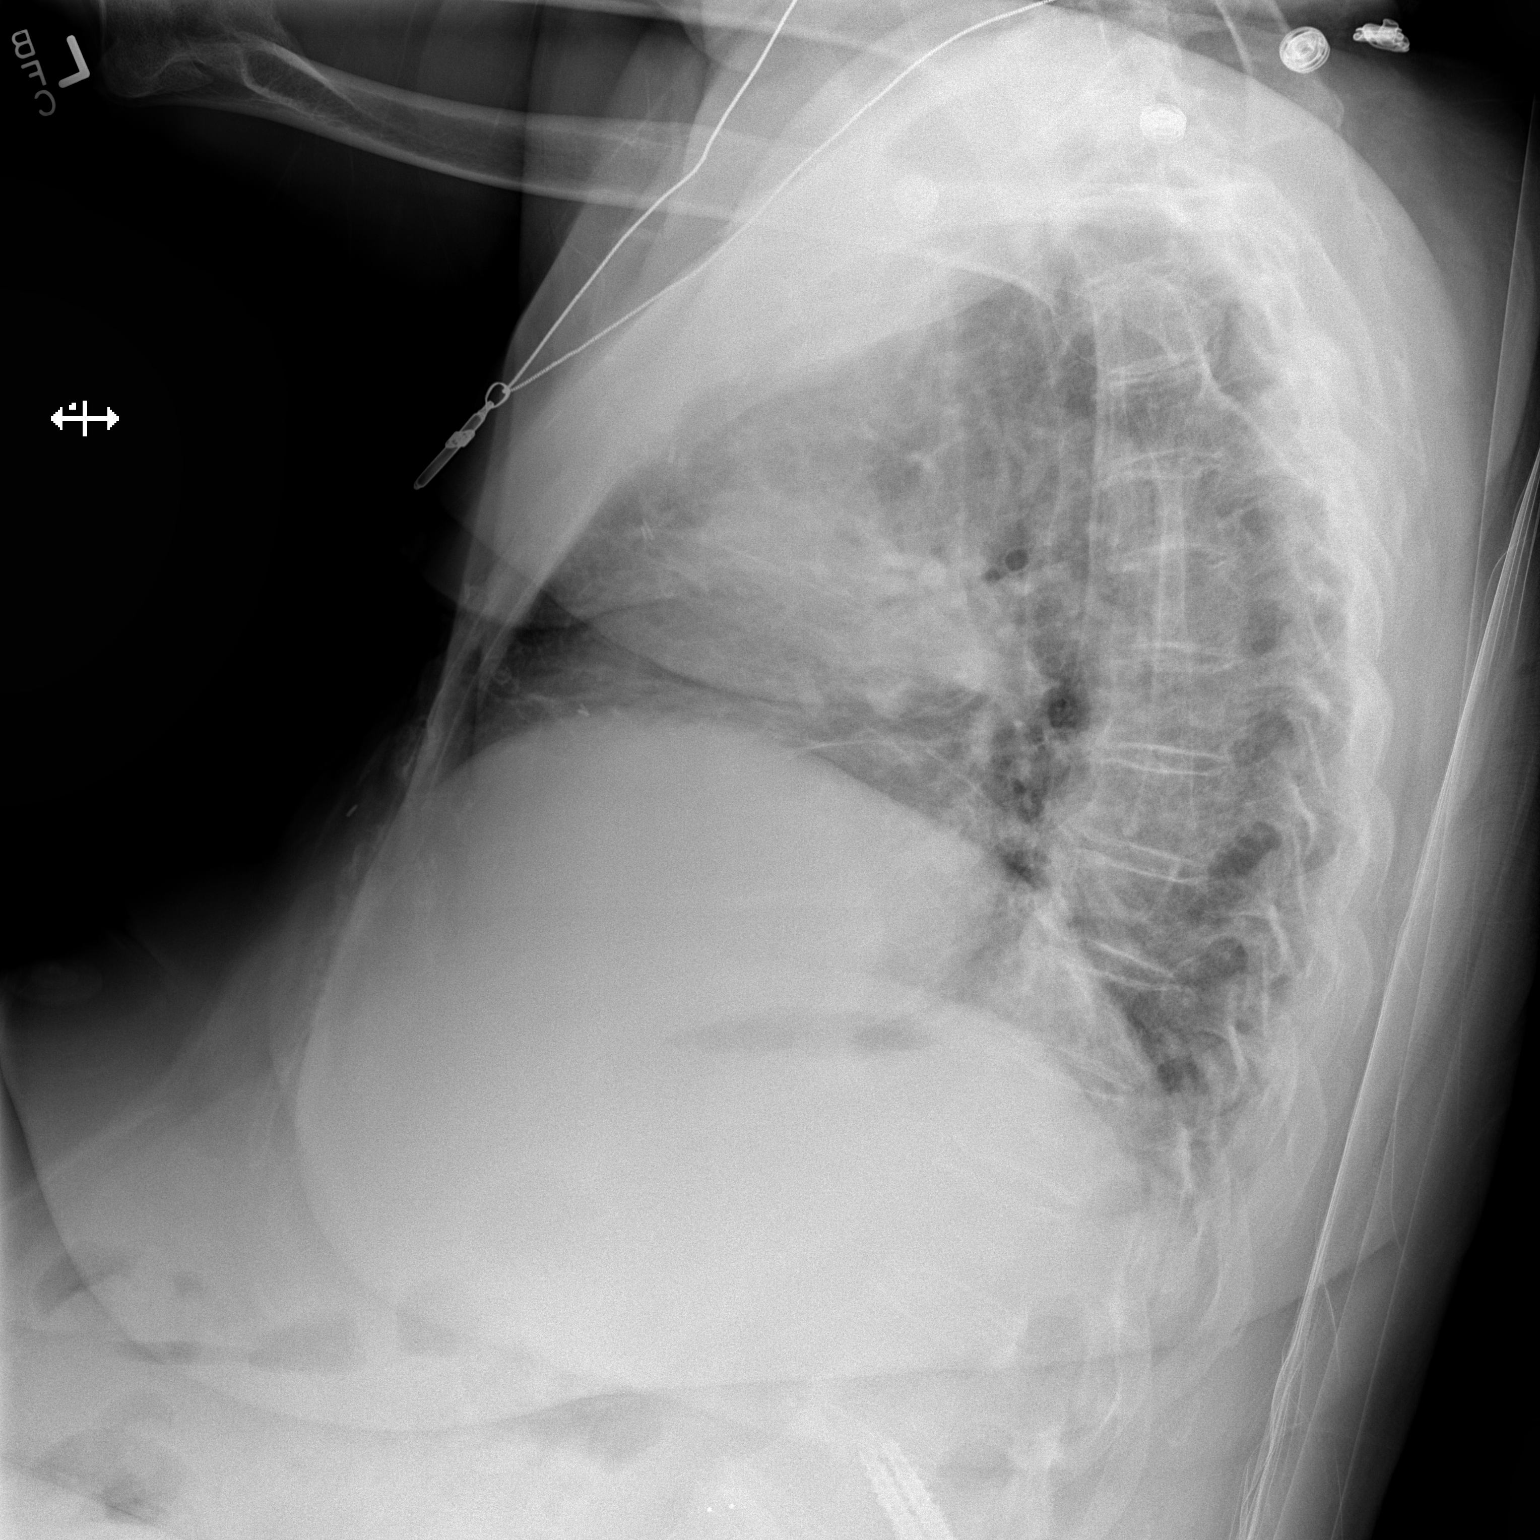

[x chest ap]
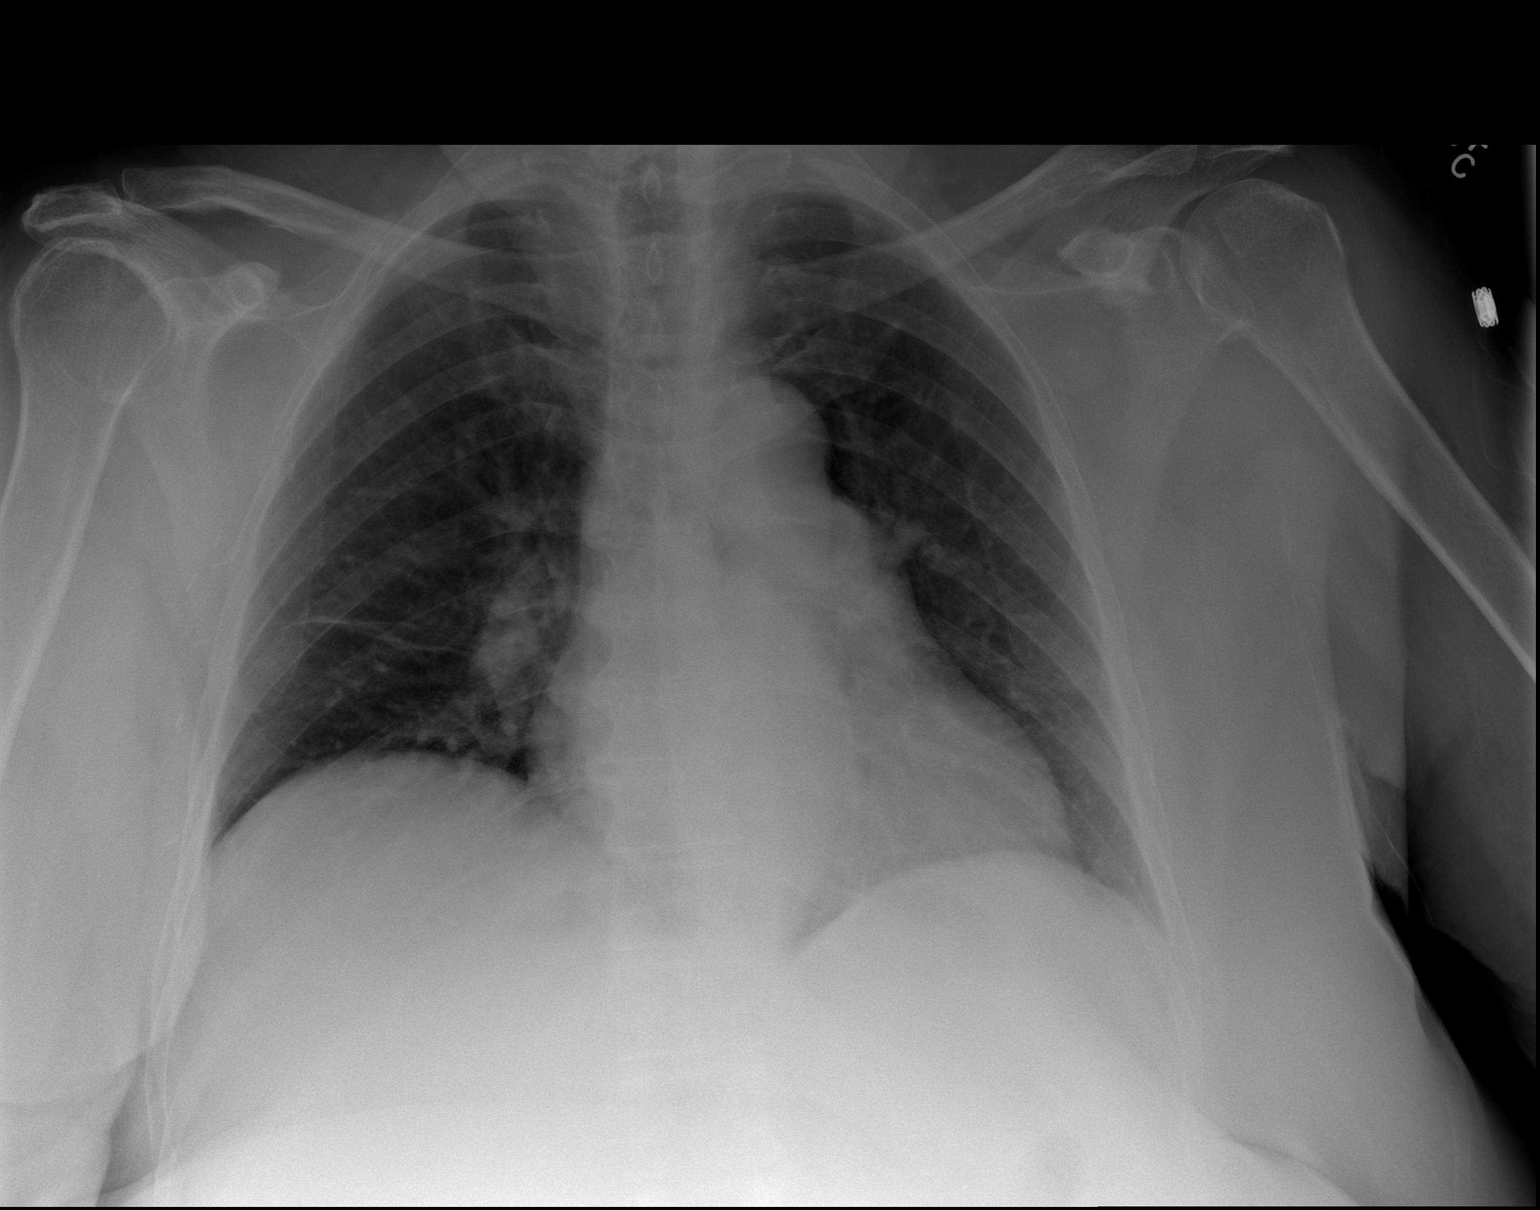

[2 of 2 positions shown; findings below may reference images not displayed]

FINDINGS: Anterior cervical spinal fusion hardware. Stable cardiac and
mediastinal contours. Tortuosity of the thoracic aorta. Elevation
right hemidiaphragm. Right basilar atelectasis. No pulmonary
consolidation. No pleural effusion or pneumothorax. Thoracic spine
degenerative changes.
IMPRESSION: No acute cardiopulmonary process.

## 2017-03-09 IMAGING — CT CT ANGIO CHEST
2 of 9 series · 17 of 46 positions shown · IV contrast (Omni 300)
Comparison: Chest radiograph performed earlier today at [DATE] p.m.

CLINICAL DATA: Acute onset of generalized chest pain. Initial
encounter.

EXAM:
CT ANGIOGRAPHY CHEST WITH CONTRAST
TECHNIQUE: Multidetector CT imaging of the chest was performed using the
standard protocol during bolus administration of intravenous
contrast. Multiplanar CT image reconstructions and MIPs were
obtained to evaluate the vascular anatomy.
CONTRAST:  80mL OMNIPAQUE IOHEXOL 350 MG/ML SOLN

[Series 5: thins · axial · 0.66mm/px · z∈[-763,-552]mm · 14 of 239 slices shown]
[im 14/239  lung]
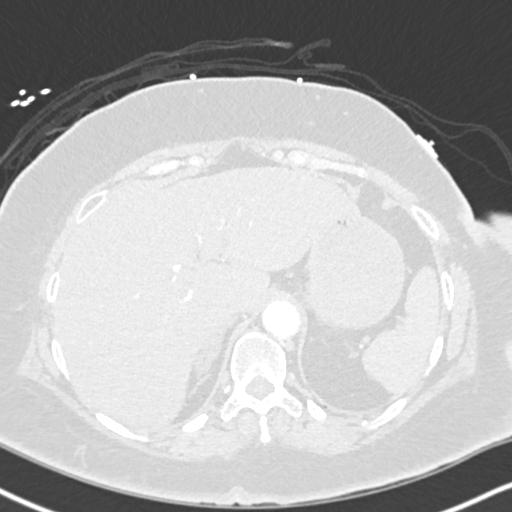
[im 27/239  soft-tissue]
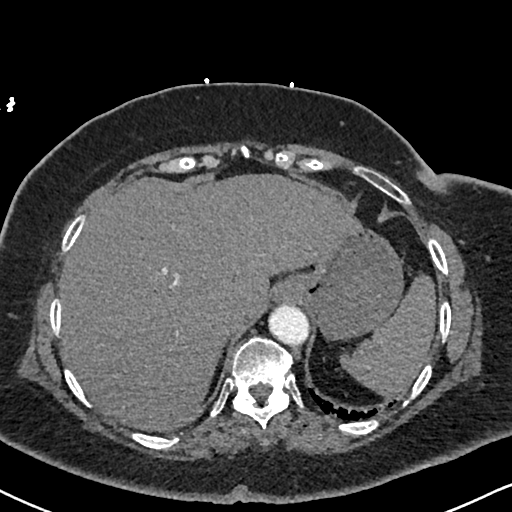
[im 53/239  lung]
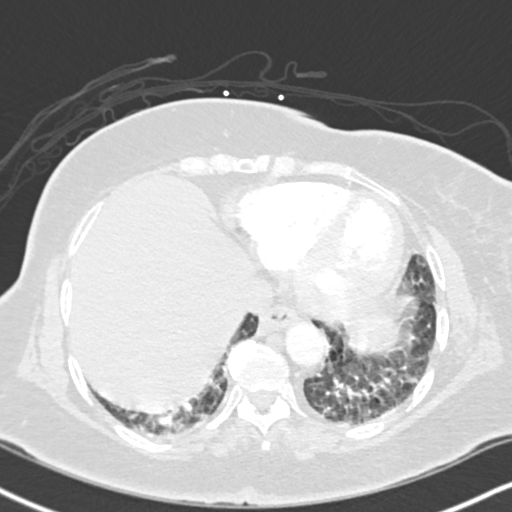
[im 67/239  soft-tissue]
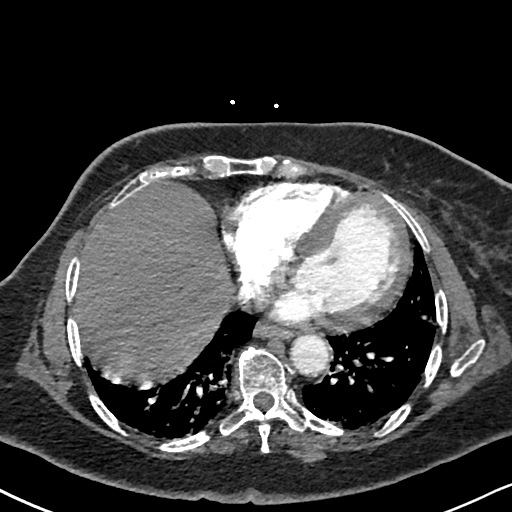
[im 80/239  lung]
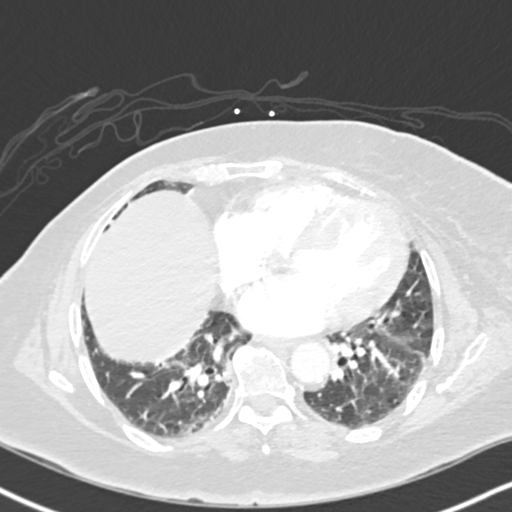
[im 93/239  soft-tissue]
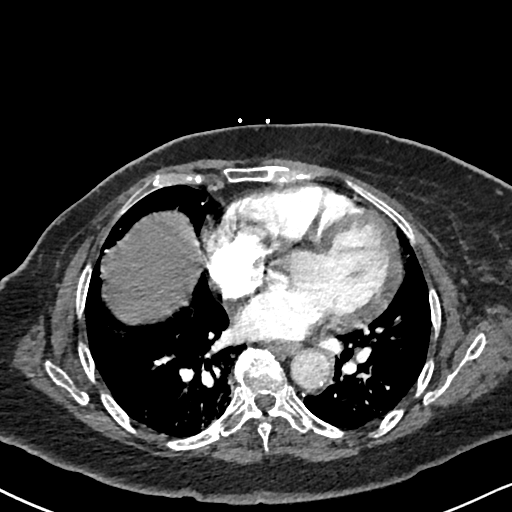
[im 106/239  lung]
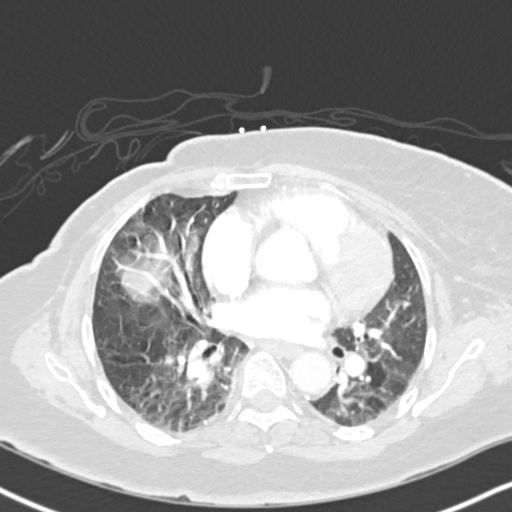
[im 133/239  soft-tissue]
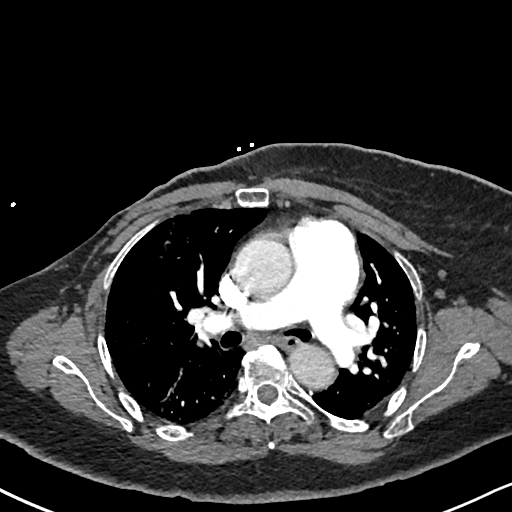
[im 146/239  lung]
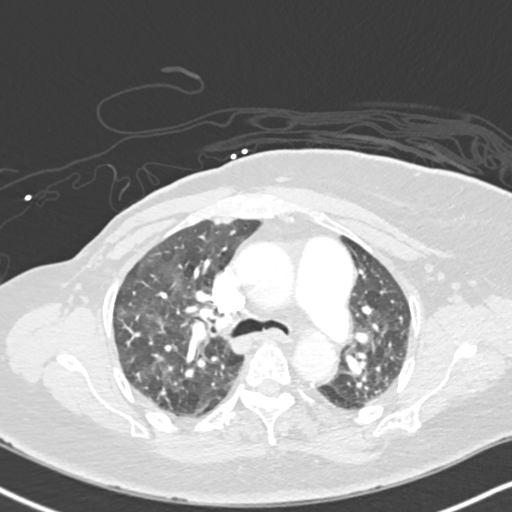
[im 159/239  soft-tissue]
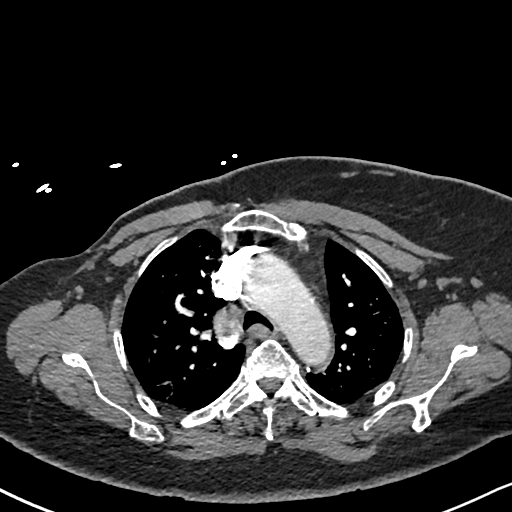
[im 172/239  lung]
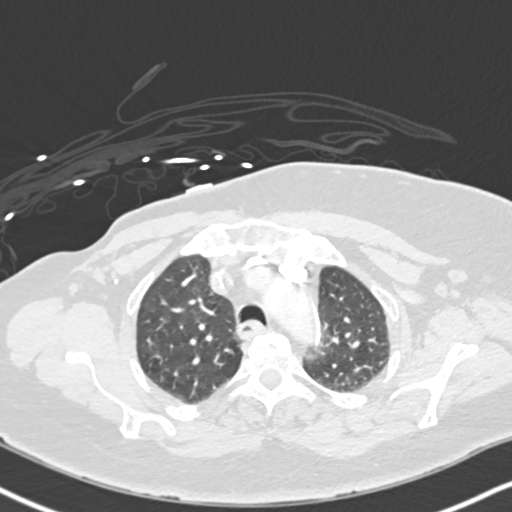
[im 186/239  soft-tissue]
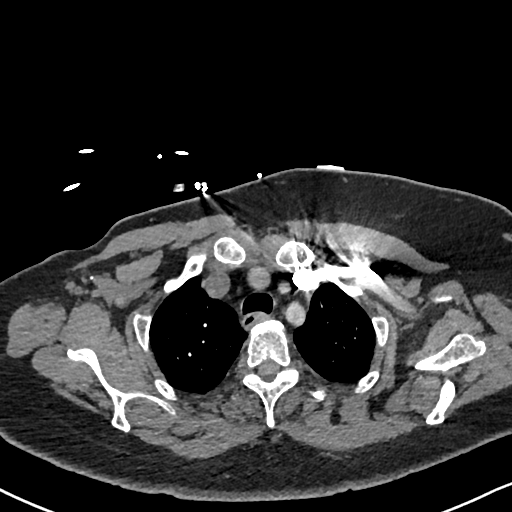
[im 212/239  lung]
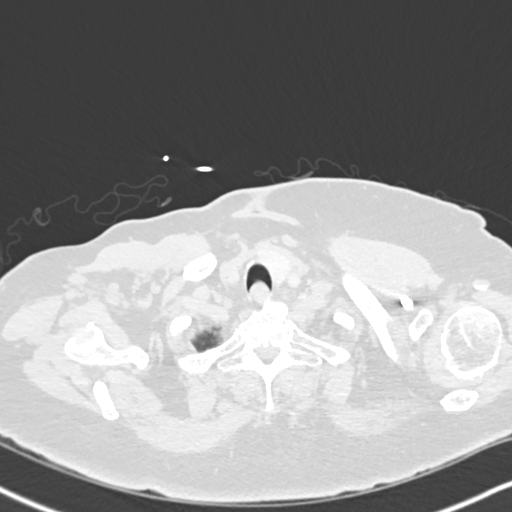
[im 225/239  soft-tissue]
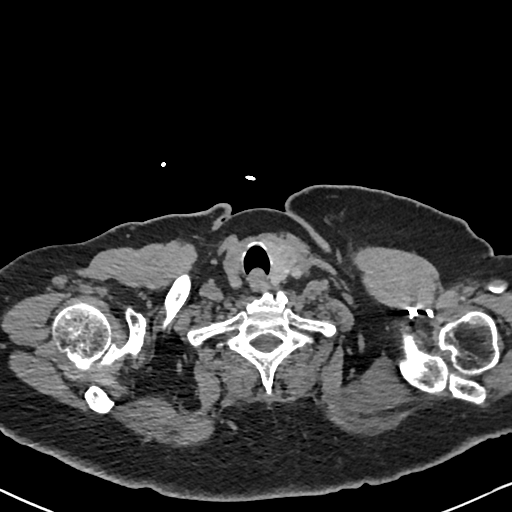

[Series 7: coronal mpr · coronal · 0.44mm/px · 3 of 104 slices shown]
[im 26/104  soft-tissue]
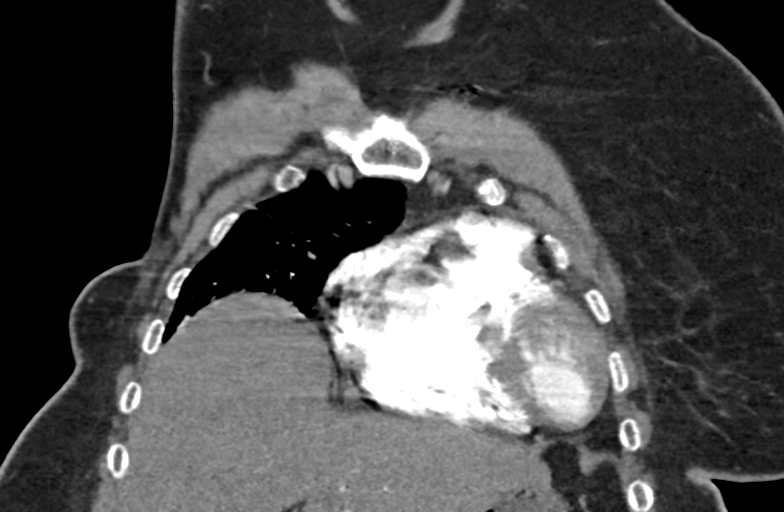
[im 52/104  soft-tissue]
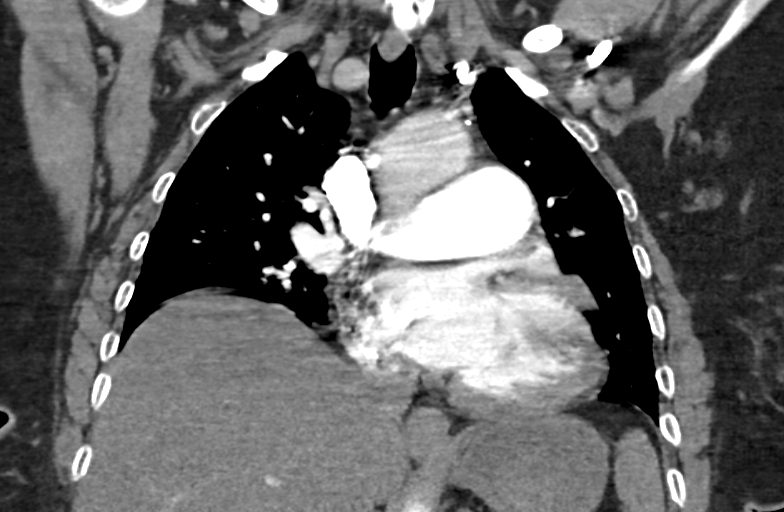
[im 78/104  soft-tissue]
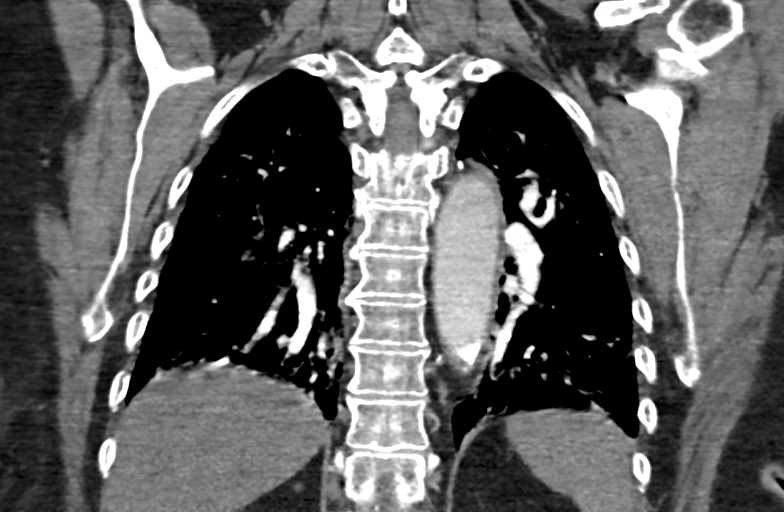

[17 of 46 positions shown; findings below may reference images not displayed]

FINDINGS: There is no evidence of pulmonary embolus.

Mild bibasilar atelectasis is noted. Slight interstitial prominence
is seen, nonspecific in appearance. No pleural effusion or
pneumothorax is identified. No masses are identified; no abnormal
focal contrast enhancement is seen.

The mediastinum is unremarkable in appearance. No mediastinal
lymphadenopathy is seen. No pericardial effusion is identified. The
great vessels are grossly unremarkable in appearance. No axillary
lymphadenopathy is seen. There is absence of the right thyroid lobe.

The patient is status post right-sided mastectomy. Postoperative
change is noted about the right axilla.

The visualized portions of the liver and spleen are unremarkable.

No acute osseous abnormalities are seen. Anterior bridging
osteophytes are seen along the upper thoracic spine.

Review of the MIP images confirms the above findings.
IMPRESSION: 1. No evidence of pulmonary embolus.
2. Mild bibasilar atelectasis noted. Slight interstitial prominence
is nonspecific. Lungs otherwise clear.

## 2017-03-10 IMAGING — CT CT ABD-PELV W/ CM
2 of 5 series · 3 of 46 positions shown, 4 images · IV contrast (Iodine)
Comparison: Recent CT abdomen/ pelvis 09/14/2014

CLINICAL DATA: 64-year-old female with sharp mid epigastric pain

EXAM:
CT ABDOMEN AND PELVIS WITH CONTRAST
TECHNIQUE: Multidetector CT imaging of the abdomen and pelvis was performed
using the standard protocol following bolus administration of
intravenous contrast.
CONTRAST:  80 mL Omnipaque 300 administered intravenously

[Series 205: sag · sagittal · 0.45mm/px · 1 of 196 slices shown]
[im 66/196  soft-tissue]
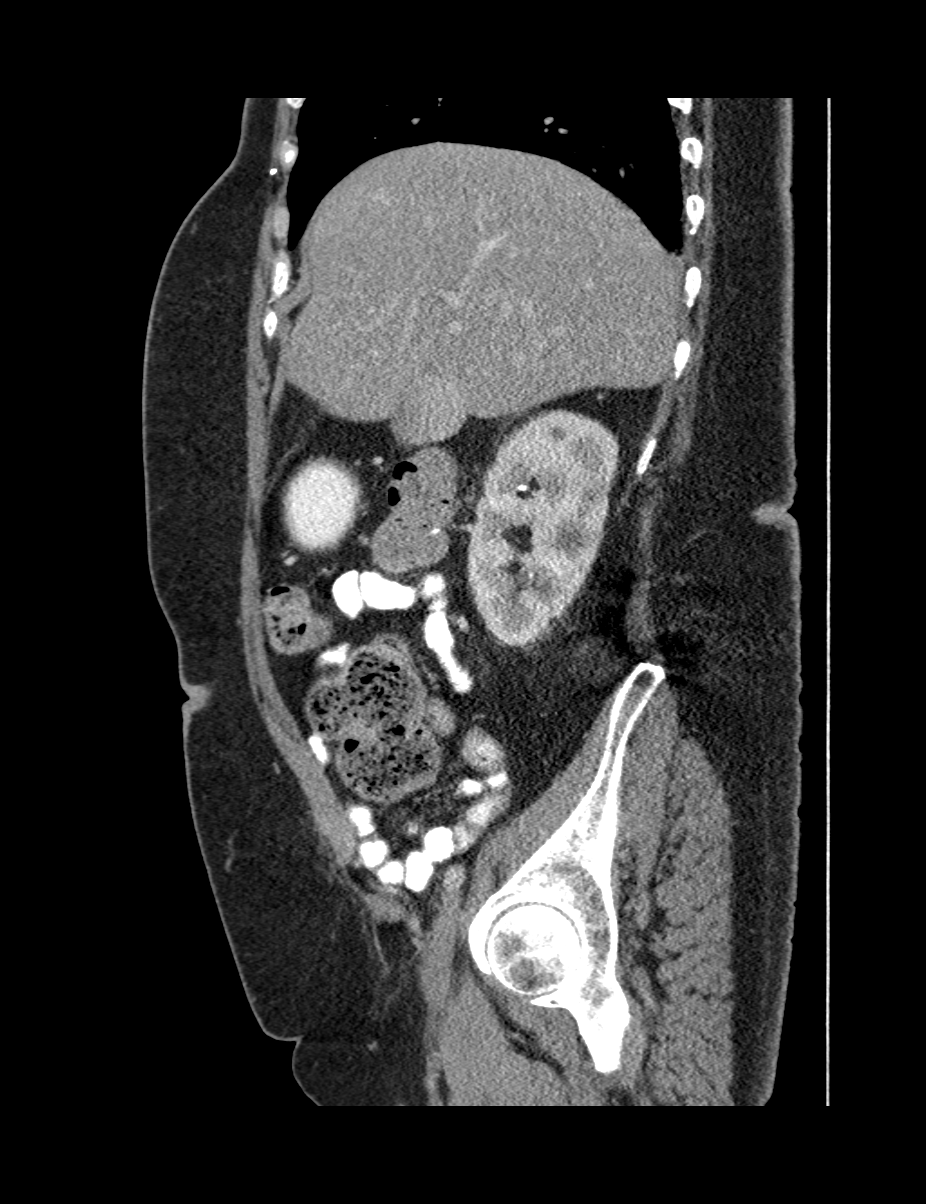

[Series 206: cor1 · coronal · 0.45mm/px · 2 of 153 slices shown, 3 images]
[im 51/153  soft-tissue]
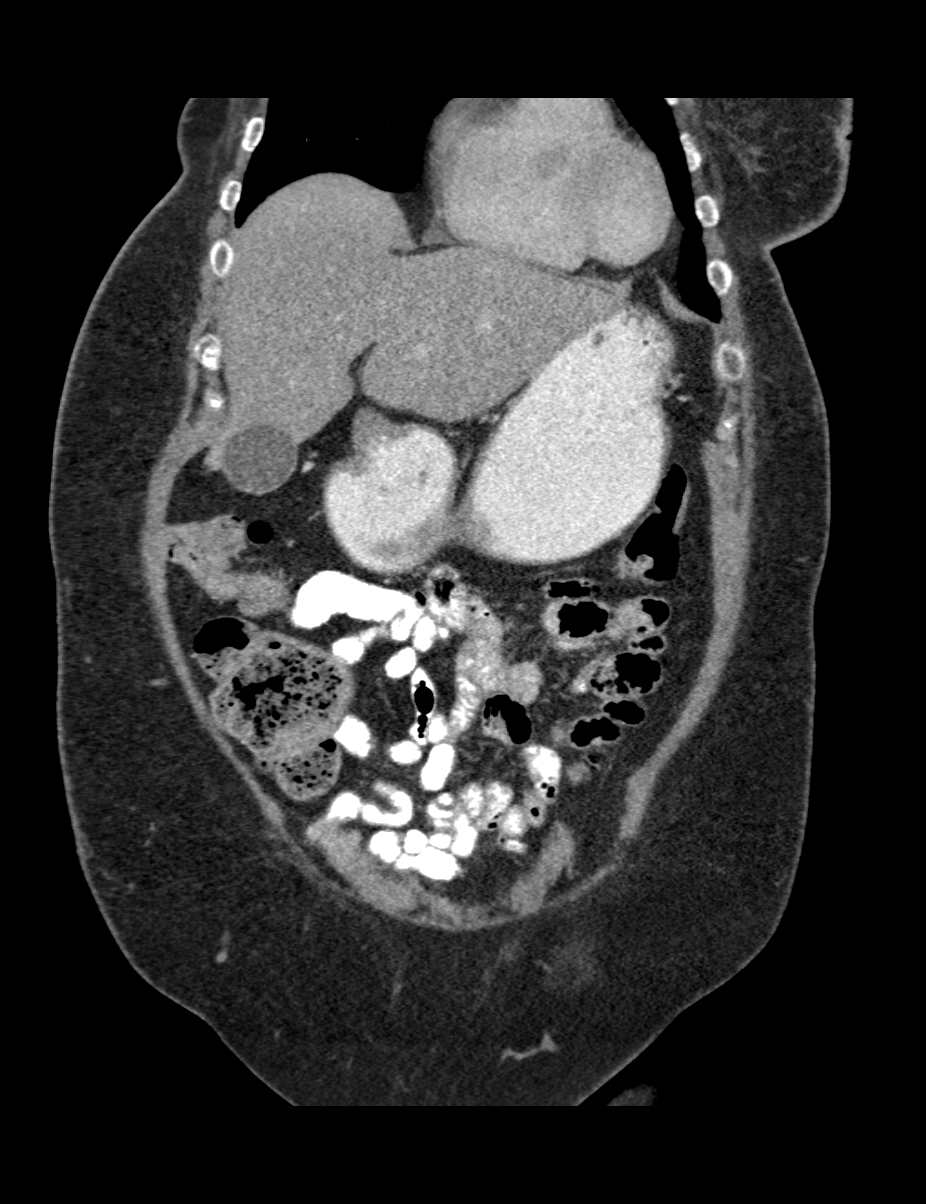
[im 51/153  bone]
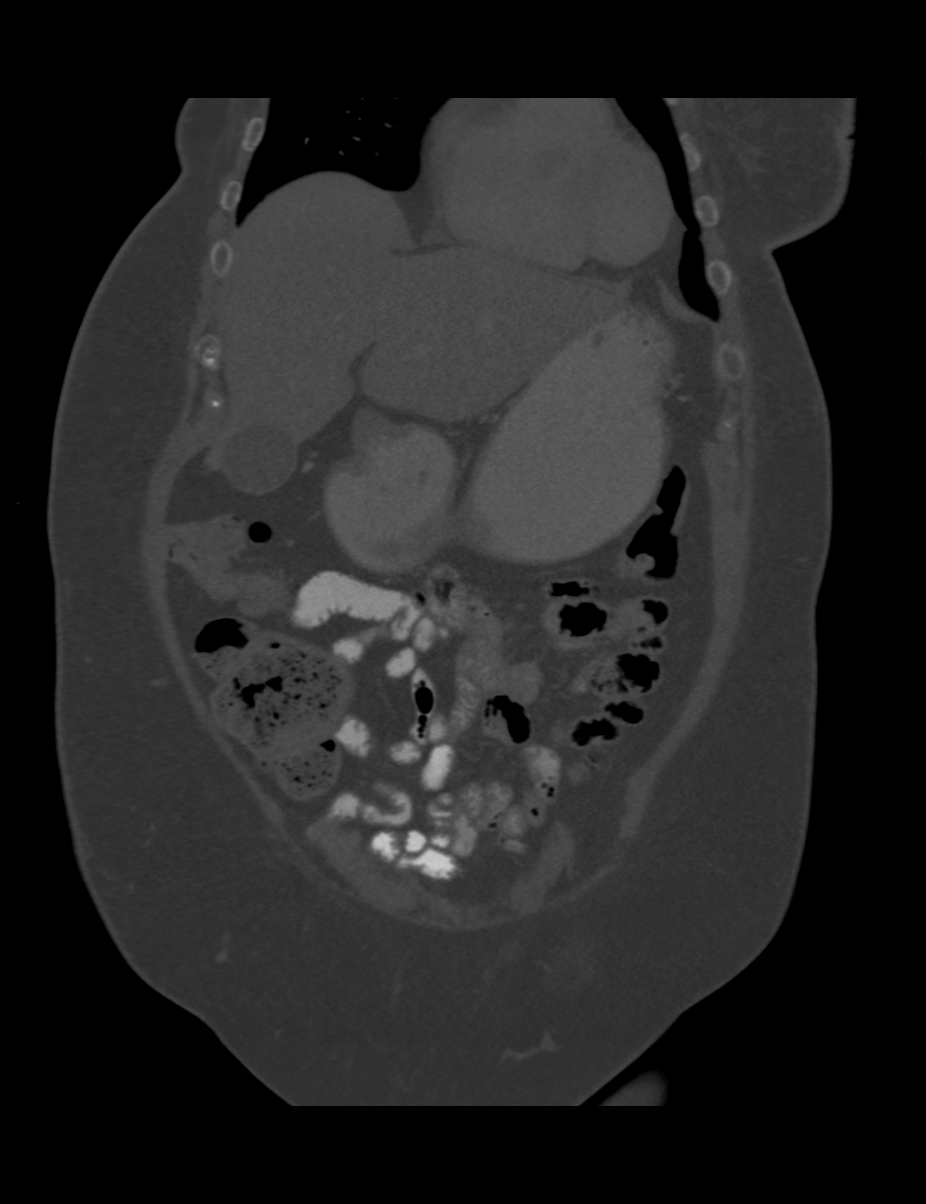
[im 119/153  soft-tissue]
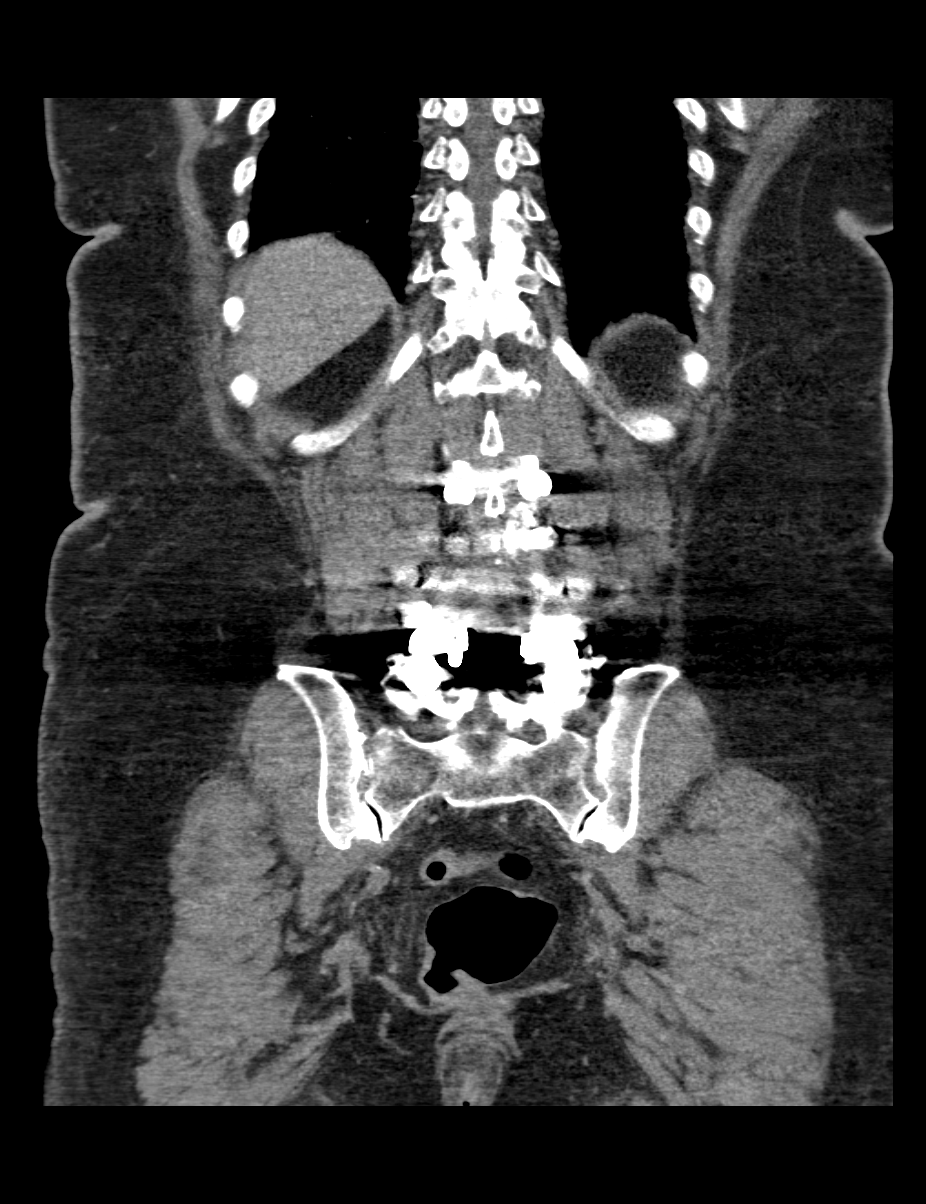

[3 of 46 positions shown; findings below may reference images not displayed]

FINDINGS: Lower Chest: The lung bases are clear. Visualized cardiac structures
are within normal limits for size. No pericardial effusion.
Unremarkable visualized distal thoracic esophagus.

Abdomen: Unremarkable CT appearance of the stomach, duodenum,
spleen, adrenal glands and pancreas. Normal hepatic contour and
morphology. No discrete hepatic lesion. High attenuation material
layers dependently within the gallbladder lumen consistent with
sludge and small stones. No definite gallbladder wall thickening or
pericholecystic fluid.

Punctate nonobstructing stone in the interpolar right kidney. 6 mm
nonobstructing stone in the interpolar left kidney. No evidence of
hydronephrosis involving either kidney. Parapelvic renal cysts in
the lower pole of the left kidney.

There are a few small colonic diverticula but no evidence of active
diverticulitis. Normal appendix in the right lower quadrant.
Unremarkable terminal ileum. No focal bowel wall thickening or
evidence of obstruction. No free fluid or suspicious adenopathy.

Pelvis: Surgical changes of prior hysterectomy. Small fat containing
umbilical hernia and small bilateral fat containing inguinal
hernias.

Bones/Soft Tissues: No acute fracture or aggressive appearing lytic
or blastic osseous lesion. Multilevel lumbar laminectomies and
posterior spinal fusion. Stable compression deformity of the
superior endplate of L1.

Vascular: No significant atherosclerotic vascular disease,
aneurysmal dilatation or acute abnormality.
IMPRESSION: 1. No acute abnormality in the abdomen or pelvis to explain the
patient's clinical symptoms.
2. Sludge and small stones layer within the gallbladder lumen. No
evidence of gallbladder wall thickening or pericholecystic fluid to
suggest acute cholecystitis.
3. Bilateral nonobstructing nephrolithiasis.
4. Small fat containing umbilical and bilateral inguinal hernias.
5. Multilevel degenerative disc disease status post extensive prior
surgical intervention.

## 2017-03-17 ENCOUNTER — Encounter: Payer: Self-pay | Admitting: Family Medicine

## 2017-03-17 ENCOUNTER — Ambulatory Visit: Payer: Medicare HMO | Admitting: Family Medicine

## 2017-03-17 VITALS — BP 170/70 | HR 60 | Temp 98.2°F | Resp 16 | Ht 65.0 in | Wt 200.0 lb

## 2017-03-17 DIAGNOSIS — E1142 Type 2 diabetes mellitus with diabetic polyneuropathy: Secondary | ICD-10-CM

## 2017-03-17 DIAGNOSIS — Z09 Encounter for follow-up examination after completed treatment for conditions other than malignant neoplasm: Secondary | ICD-10-CM

## 2017-03-17 DIAGNOSIS — I10 Essential (primary) hypertension: Secondary | ICD-10-CM | POA: Diagnosis not present

## 2017-03-17 DIAGNOSIS — Z794 Long term (current) use of insulin: Secondary | ICD-10-CM | POA: Diagnosis not present

## 2017-03-17 MED ORDER — HYDROCHLOROTHIAZIDE 12.5 MG PO CAPS: 12.5000 mg | ORAL_CAPSULE | Freq: Every day | ORAL | 0 refills | Status: DC

## 2017-03-22 NOTE — Progress Notes (Signed)
Subjective:    Patient ID: [name], female    DOB: 06-28-1950, 66 y.o.   MRN: [id number]  HPI Patient was recently admitted to the hospital.  I have copied relevant portions of the discharge summary below for my reference:  Admit date: 03/04/2017 Discharge date: 03/07/2017  Time spent: 35 minutes  Recommendations for Outpatient Follow-up:  Atypical Chest Pain -does not appear to be cardiac in nature. Per cardiology's note no further cardiology workup recommended. -Patient has not controlled her diabetes or cholesterol. Has been counseled on sequela of continued uncontrolled diabetes and cholesterol to include MI, stroke -See follow-up recommendations below  LE edema -elevated d-dimer -11/27 lower extremity Doppler negative for DVT/SVT  Diabetes Type 2 uncontrolled with complication -September 1950 Hemoglobin A1c= 9.3  -schedule follow-up appointment with Dr. Jenna Luo in 1-2 weeks uncontrolled diabetes, HTN, chronic diastolic CHF  HLD -Lipid panel not within ADA/AHA guidelines -unfortunately patient had been on Lipitor but per Dr. Allegra Lai cardiology note 10/27/2015 patient Lipitor discontinued secondary to body aches and pains.  -Given patient's continued CP and risk factors may want to retry on Crestor will leave up to cardiologist and PCP to decide when/if to restart   Essential HTN   Chronic diastolic CHF No comment on DD on TTE this admit- no evidence of gross overload on exam -schedule follow-up in 4-6 weeks Dr. Allegra Lai Cardiology chronic diastolic CHF,Essential HTN   Hypothyroidism Cont home synthroid dose    Discharge Diagnoses:  Principal Problem:   Chest pain Active Problems:   Hypothyroidism   Diabetes with neurologic complications ([place])   Chronic diastolic heart failure (HCC)   Positive D dimer   Elevated d-dimer   Elevated troponin    Discharge Condition: stable  Diet recommendation: heart  healthy/American diabetic Association       Filed Weights   03/05/17 0146 03/06/17 0611 03/07/17 0412  Weight: 195 lb 4.8 oz (88.6 kg) 193 lb 9.6 oz (87.8 kg) 192 lb 12.8 oz (87.5 kg)    History of present illness:  66 y.o.femalewith a history of HTN, DM, breast cancer status post mastectomy, hypothyroidism, and chronic diastolic CHF who presented to the ED w/ atypical CP for more than a month (pressure-like, constant, worse with movements, radiating to the right arm, and associated with mild dyspnea and nausea).   In the ED EKG noted normal sinus rhythm and chest x-ray was negative for acute cardiopulmonary disease. Initial troponin was undetectable. D-dimer was elevated at 0.84 and second troponin was borderline at 0.03. CTa was not able to be done due to "poor IV access." Patient with atypical chest pain seen by cardiology deemed no further workup required. Will follow up with cardiology see recommendations above.   Procedures 11/25 Echocardiogram:Left ventricle: mild LVH. -LVEF =-55% to 60%. Negative WMA  Consultations: Cardiology    Discharge Exam:       Vitals:   03/07/17 0747 03/07/17 1000 03/07/17 1118 03/07/17 1400  BP: 137/70 134/80 136/83 (!) 144/72  Pulse: (!) 51 72 98 92  Resp:   18 18  Temp: 97.6 F (36.4 C) 97.8 F (36.6 C) 98 F (36.7 C) 98 F (36.7 C)  TempSrc: Oral Oral Oral Oral  SpO2: 95% 95% 95% 95%  Weight:      Height:        General: a/O 4, negative acute respiratory distress Cardiovascular: regular rhythm and rate, negative murmurs rubs or gallops, normal S1/S2 Respiratory: clear to auscultation bilateral  Discharge Instructions  Allergies as of 03/07/2017      Reactions   Dilaudid [hydromorphone Hcl] Nausea And Vomiting   Lipitor [atorvastatin] Other (See Comments)   Body & Muscle Aches   Adhesive [tape] Rash   Ampicillin Rash   REACTION: Rash and welts on her hands when she took it in the  hospital   Penicillins Rash              Medication List     STOP taking these medications   furosemide 40 MG tablet Commonly known as:  LASIX   losartan-hydrochlorothiazide 50-12.5 MG tablet Commonly known as:  HYZAAR   naproxen sodium 220 MG tablet Commonly known as:  ALEVE   polyethylene glycol packet Commonly known as:  MIRALAX / GLYCOLAX     TAKE these medications   acetaminophen 500 MG tablet Commonly known as:  TYLENOL Take 500 mg by mouth every 4 (four) hours as needed for mild pain or headache.   aspirin 81 MG tablet Take 81 mg by mouth daily.   baclofen 10 MG tablet Commonly known as:  LIORESAL Take 10 mg by mouth 3 (three) times daily as needed for muscle spasms.   Blood Glucose System Pak Kit Use as directed to monitor FSBS 3x daily. Dx: E11.9. Please dispense [employer] True Metrix.   BLOOD GLUCOSE TEST STRIPS Strp Use as directed to monitor FSBS 3x daily. Dx: E11.9. Please dispense [employer] True Metrix.   cyclobenzaprine 10 MG tablet Commonly known as:  FLEXERIL Take 1 tablet (10 mg total) by mouth 3 (three) times daily as needed for muscle spasms.   Diclofenac-Misoprostol 50-0.2 MG Tbec Take 1 tablet by mouth 3 (three) times daily after meals.   fluticasone 50 MCG/ACT nasal spray Commonly known as:  FLONASE Place 2 sprays into both nostrils daily.   gabapentin 300 MG capsule Commonly known as:  NEURONTIN Take 1 capsule (300 mg total) by mouth 3 (three) times daily.   hydrochlorothiazide 12.5 MG capsule Commonly known as:  MICROZIDE Take 1 capsule (12.5 mg total) by mouth daily. Start taking on:  03/08/2017   Insulin Glargine 300 UNIT/ML Sopn Commonly known as:  TOUJEO SOLOSTAR Inject 80 Units into the skin at bedtime.   Lancet Devices Misc Use as directed to monitor FSBS 3x daily. Dx: E11.9. Please dispense TruePlus Ultra Thin.   Lancets Misc Use as directed to monitor FSBS 3x daily. Dx: E11.9. Please dispense TruePlus  Ultra Thin.   LAXATIVE FORMULA Tabs Take 1 tablet by mouth as needed (for BM).   levothyroxine 100 MCG tablet Commonly known as:  SYNTHROID, LEVOTHROID Take 1 tablet (100 mcg total) by mouth daily.   losartan 50 MG tablet Commonly known as:  COZAAR Take 1 tablet (50 mg total) by mouth daily. Start taking on:  03/08/2017   meclizine 12.5 MG tablet Commonly known as:  ANTIVERT Take 1 tablet (12.5 mg total) by mouth 3 (three) times daily as needed for dizziness.   metFORMIN 850 MG tablet Commonly known as:  GLUCOPHAGE Take 1 tablet (850 mg total) by mouth 2 (two) times daily with a meal.   Pen Needles 5/16" 30G X 8 MM Misc Use to inject insulin 5x daily.   ranitidine 150 MG capsule Commonly known as:  ZANTAC Take 150 mg by mouth 2 (two) times daily.   TRUE METRIX LEVEL 2 Normal Soln Use as directed.   Vitamin D3 5000 units Caps Take 5,000 Units by mouth 2 (two) times daily.     Patient is  here today for follow-up.  She has seen 2 separate endocrinologist due to my inability to control her blood sugar secondary to noncompliance.  She was seeing Dr. Bubba Camp and Dr. Loanne Drilling at different points in the past.  She has been noncompliant with her medication.  She readily admits this.  She is not checking her blood sugars.  Over the last few weeks, she has resumed her metformin along with her Toujeo.  She is taking 80 units a day.  However she is only checked her blood sugars the last 2 days.  Her fasting blood sugars have been between 101 130.  Her 2-hour postprandial sugars are less than 160.  However I only have 4 readings to evaluate.  Still, with compliance with her medication, it seems that her sugars will be controlled if this trend continues.  Her blood pressure today is extremely high however she is not taking hydrochlorothiazide.  She is taking losartan.  She is not sure why she is not taking hydrochlorothiazide. Current Outpatient Medications on File Prior to Visit    Medication Sig Dispense Refill  . acetaminophen (TYLENOL) 500 MG tablet Take 500 mg by mouth every 4 (four) hours as needed for mild pain or headache.    [name] Kitchen aspirin 81 MG tablet Take 81 mg by mouth daily.    . baclofen (LIORESAL) 10 MG tablet Take 10 mg by mouth 3 (three) times daily as needed for muscle spasms.    . Blood Glucose Calibration (TRUE METRIX LEVEL 2) Normal SOLN Use as directed. 3 each 3  . Blood Glucose Monitoring Suppl (BLOOD GLUCOSE SYSTEM PAK) KIT Use as directed to monitor FSBS 3x daily. Dx: E11.9. Please dispense [employer] True Metrix. 1 each 1  . Cholecalciferol (VITAMIN D3) 5000 UNITS CAPS Take 5,000 Units by mouth 2 (two) times daily.    . cyclobenzaprine (FLEXERIL) 10 MG tablet Take 1 tablet (10 mg total) by mouth 3 (three) times daily as needed for muscle spasms. 20 tablet 0  . diclofenac (CATAFLAM) 50 MG tablet Take 50 mg by mouth 3 (three) times daily.    . Diclofenac-Misoprostol 50-0.2 MG TBEC Take 1 tablet by mouth 3 (three) times daily after meals. 60 tablet 1  . fluticasone (FLONASE) 50 MCG/ACT nasal spray Place 2 sprays into both nostrils daily. 48 g 3  . gabapentin (NEURONTIN) 300 MG capsule Take 1 capsule (300 mg total) by mouth 3 (three) times daily. 90 capsule 3  . Glucose Blood (BLOOD GLUCOSE TEST STRIPS) STRP Use as directed to monitor FSBS 3x daily. Dx: E11.9. Please dispense [employer] True Metrix. 300 each 3  . Insulin Glargine (TOUJEO SOLOSTAR) 300 UNIT/ML SOPN Inject 80 Units into the skin at bedtime. 3 mL 0  . Insulin Pen Needle (PEN NEEDLES 5/16") 30G X 8 MM MISC Use to inject insulin 5x daily. 200 each 11  . Lancet Devices MISC Use as directed to monitor FSBS 3x daily. Dx: E11.9. Please dispense TruePlus Ultra Thin. 1 each 1  . Lancets MISC Use as directed to monitor FSBS 3x daily. Dx: E11.9. Please dispense TruePlus Ultra Thin. 300 each 3  . levothyroxine (SYNTHROID, LEVOTHROID) 100 MCG tablet Take 1 tablet (100 mcg total) by mouth daily. 30 tablet 5  .  losartan (COZAAR) 50 MG tablet Take 1 tablet (50 mg total) by mouth daily. 30 tablet 0  . meclizine (ANTIVERT) 12.5 MG tablet Take 1 tablet (12.5 mg total) by mouth 3 (three) times daily as needed for dizziness. 30 tablet 0  .  metFORMIN (GLUCOPHAGE) 850 MG tablet Take 1 tablet (850 mg total) by mouth 2 (two) times daily with a meal. 180 tablet 3  . Misc Natural Products (LAXATIVE FORMULA) TABS Take 1 tablet by mouth as needed (for BM).    . ranitidine (ZANTAC) 150 MG capsule Take 150 mg by mouth 2 (two) times daily.     No current facility-administered medications on file prior to visit.     Allergies  Allergen Reactions  . Dilaudid [Hydromorphone Hcl] Nausea And Vomiting  . Lipitor [Atorvastatin] Other (See Comments)    Body & Muscle Aches  . Adhesive [Tape] Rash  . Ampicillin Rash    REACTION: Rash and welts on her hands when she took it in the hospital  . Penicillins Rash   Social History   Socioeconomic History  . Marital status: Married    Spouse name: Not on file  . Number of children: 1  . Years of education: Not on file  . Highest education level: Not on file  Social Needs  . Financial resource strain: Not on file  . Food insecurity - worry: Not on file  . Food insecurity - inability: Not on file  . Transportation needs - medical: Not on file  . Transportation needs - non-medical: Not on file  Occupational History  . Occupation: Retired since 2003  Tobacco Use  . Smoking status: Never Smoker  . Smokeless tobacco: Never Used  Substance and Sexual Activity  . Alcohol use: No  . Drug use: No  . Sexual activity: Not on file  Other Topics Concern  . Not on file  Social History Narrative  . Not on file   Review of Systems  All other systems reviewed and are negative.    Review of Systems  All other systems reviewed and are negative.      Objective:   Physical Exam  Constitutional: She is oriented to person, place, and time. She appears well-developed and  well-nourished. No distress.  Neck: Neck supple. No JVD present. No thyromegaly present.  Cardiovascular: Normal rate, regular rhythm and normal heart sounds. Exam reveals no gallop and no friction rub.  No murmur heard. Pulmonary/Chest: Effort normal and breath sounds normal. No respiratory distress. She has no wheezes. She has no rales. She exhibits no tenderness.  Abdominal: Soft. Bowel sounds are normal. She exhibits no distension and no mass. There is no tenderness. There is no rebound and no guarding.  Lymphadenopathy:    She has no cervical adenopathy.  Neurological: She is alert and oriented to person, place, and time.  Skin: She is not diaphoretic.  Vitals reviewed.         Assessment & Plan:  Type 2 diabetes mellitus with diabetic polyneuropathy, with long-term current use of insulin [facility])  Essential hypertension  Hospital discharge follow-up  Patient had a stress test while in the hospital.  I have copied the results below: IMPRESSION: 1. No reversible ischemia. Difficult to exclude fixed defects and infarcts involving the lateral and inferolateral wall.  2. Mild hypokinesia, particularly along the inferior wall.  3. Left ventricular ejection fraction is 47%.  4. Non invasive risk stratification*:   Patient is concerned about the cause of the mild hypokinesis noted along the inferior wall and the suppressed ejection fraction.  I have recommended compliance with her medical therapy to prevent this from worsening.  First and foremost, we need to gain control of her blood pressure.  I want her to resume hydrochlorothiazide 25 mg a  day and recheck blood pressure in 2 weeks.  Second we need to ensure that her sugars are under good control.  Essentially we will start from scratch.  I will again try to manage her blood sugars after having a long discussion with the patient stressing the need for compliance and follow-up.  She is to check her fasting blood sugar and 2-hour  postprandial sugars for the next week and provide the readings for me to ensure adequate control.  Her goal fasting blood sugars are between 80 and 130.  Her goal 2-hour postprandial sugars are between 80 and 160.  If she maintains these readings, I would make no further changes in her diabetic medication.  I would recommend repeat hemoglobin A1c in 3 months.

## 2017-03-27 ENCOUNTER — Ambulatory Visit: Payer: Medicare HMO | Admitting: Podiatry

## 2017-03-27 DIAGNOSIS — E0843 Diabetes mellitus due to underlying condition with diabetic autonomic (poly)neuropathy: Secondary | ICD-10-CM | POA: Diagnosis not present

## 2017-03-27 DIAGNOSIS — M79676 Pain in unspecified toe(s): Secondary | ICD-10-CM

## 2017-03-27 DIAGNOSIS — B351 Tinea unguium: Secondary | ICD-10-CM

## 2017-03-27 DIAGNOSIS — L989 Disorder of the skin and subcutaneous tissue, unspecified: Secondary | ICD-10-CM | POA: Diagnosis not present

## 2017-03-28 ENCOUNTER — Ambulatory Visit: Payer: Medicare HMO

## 2017-03-28 ENCOUNTER — Ambulatory Visit: Payer: Medicare HMO | Admitting: Podiatry

## 2017-03-28 ENCOUNTER — Telehealth: Payer: Self-pay

## 2017-03-28 VITALS — BP 150/82 | HR 63

## 2017-03-28 DIAGNOSIS — Z0130 Encounter for examination of blood pressure without abnormal findings: Secondary | ICD-10-CM

## 2017-03-28 NOTE — Telephone Encounter (Signed)
Too high, up losartan to 100 mg a day

## 2017-03-28 NOTE — Telephone Encounter (Signed)
Patient was in office for a blood pressure check. It was 150/82 patient did not have her readings with her in office but states it has been running high at home with top number being 170 and lowest top 150's  since starting new blood pressure medicine.Pulse and oxygen levels were normal. Patient is sch for an office visit on 03/31/2017

## 2017-03-28 NOTE — Telephone Encounter (Signed)
Spoke with patient she is  aware to take 2 (50mg ) losartan for a total of 100mg .

## 2017-03-29 NOTE — Progress Notes (Signed)
    Subjective: Patient is a 66 y.o. female presenting to the office today with a chief complaint of painful callus lesions to the bilateral feet that have been present for several weeks.  Patient also complains of elongated, thickened nails that cause pain while ambulating in shoes. Patient is unable to trim their own nails. Patient presents today for further treatment and evaluation.  Past Medical History:  Diagnosis Date  . Anginal pain ([place]) 03/19/2012   saw Dr. Cathie Olden .. she thinks its more related to stomach issues  . Arthritis    "all over; mostly in my back" (03/20/2012)  . Breast cancer ([place])    "right" (03/20/2012)  . Cardiomyopathy   . Chest pain 06/2005   Hospitalized, dystolic dysfunction  . Chronic lower back pain    "have 2 herniated discs; going to have to have a fusion" (03/20/2012)  . Family history of anesthesia complication    "father has nausea and vomiting"  . GERD (gastroesophageal reflux disease)   . H/O hiatal hernia   . History of kidney stones   . History of right mastectomy   . Hypertension    "patient states never had HTN, takes Hyzaar for heart  . Hypothyroidism   . Irritable bowel syndrome   . Migraines    "it's been a long time since I've had one" (03/20/2012)  . Osteoarthritis   . Peripheral neuropathy   . PONV (postoperative nausea and vomiting)   . Sciatica   . Type II diabetes mellitus (H[facility])     Objective:  Physical Exam General: Alert and oriented x3 in no acute distress  Dermatology: Hyperkeratotic lesion present on the bilateral feet x 3. Pain on palpation with a central nucleated core noted. Skin is warm, dry and supple bilateral lower extremities. Negative for open lesions or macerations. Nails are tender, long, thickened and dystrophic with subungual debris, consistent with onychomycosis, 1-5 bilateral. No signs of infection noted.  Vascular: Palpable pedal pulses bilaterally. No edema or erythema noted. Capillary refill within  normal limits.  Neurological: Epicritic and protective threshold grossly intact bilaterally.   Musculoskeletal Exam: Pain on palpation at the keratotic lesion noted. Range of motion within normal limits bilateral. Muscle strength 5/5 in all groups bilateral.  Assessment: 1. Onychodystrophic nails 1-5 bilateral with hyperkeratosis of nails.  2. Onychomycosis of nail due to dermatophyte bilateral 3. Pre-ulcerative calluses x 3 to the bilateral feet   Plan of Care:  #1 Patient evaluated. #2 Excisional debridement of keratoic lesion using a chisel blade was performed without incident.  #3 Dressed with light dressing. #4 Mechanical debridement of nails 1-5 bilaterally performed using a nail nipper. Filed with dremel without incident.  #5 Patient is to return to the clinic in 3 months with Dr. Amalia Hailey.   Edrick Kins, DPM [facility]  Dr. Edrick Kins, [address]                                        [place], [place] [address]                Office [phone]  Fax [phone]

## 2017-03-31 ENCOUNTER — Other Ambulatory Visit: Payer: Self-pay

## 2017-03-31 ENCOUNTER — Ambulatory Visit: Payer: Medicare HMO | Admitting: Family Medicine

## 2017-03-31 ENCOUNTER — Encounter: Payer: Self-pay | Admitting: Family Medicine

## 2017-03-31 VITALS — BP 134/72 | HR 64 | Temp 97.6°F | Resp 18 | Wt 200.0 lb

## 2017-03-31 DIAGNOSIS — Z794 Long term (current) use of insulin: Secondary | ICD-10-CM | POA: Diagnosis not present

## 2017-03-31 DIAGNOSIS — I10 Essential (primary) hypertension: Secondary | ICD-10-CM

## 2017-03-31 DIAGNOSIS — R0789 Other chest pain: Secondary | ICD-10-CM

## 2017-03-31 DIAGNOSIS — E1142 Type 2 diabetes mellitus with diabetic polyneuropathy: Secondary | ICD-10-CM | POA: Diagnosis not present

## 2017-03-31 NOTE — Progress Notes (Signed)
Subjective:    Patient ID: [name], female    DOB: 06-28-1950, 66 y.o.   MRN: [id number]  HPI Patient was recently admitted to the hospital.  I have copied relevant portions of the discharge summary below for my reference:  Admit date: 03/04/2017 Discharge date: 03/07/2017  Time spent: 35 minutes  Recommendations for Outpatient Follow-up:  Atypical Chest Pain -does not appear to be cardiac in nature. Per cardiology's note no further cardiology workup recommended. -Patient has not controlled her diabetes or cholesterol. Has been counseled on sequela of continued uncontrolled diabetes and cholesterol to include MI, stroke -See follow-up recommendations below  LE edema -elevated d-dimer -11/27 lower extremity Doppler negative for DVT/SVT  Diabetes Type 2 uncontrolled with complication -September 1950 Hemoglobin A1c= 9.3  -schedule follow-up appointment with Dr. Jenna Luo in 1-2 weeks uncontrolled diabetes, HTN, chronic diastolic CHF  HLD -Lipid panel not within ADA/AHA guidelines -unfortunately patient had been on Lipitor but per Dr. Allegra Lai cardiology note 10/27/2015 patient Lipitor discontinued secondary to body aches and pains.  -Given patient's continued CP and risk factors may want to retry on Crestor will leave up to cardiologist and PCP to decide when/if to restart   Essential HTN   Chronic diastolic CHF No comment on DD on TTE this admit- no evidence of gross overload on exam -schedule follow-up in 4-6 weeks Dr. Allegra Lai Cardiology chronic diastolic CHF,Essential HTN   Hypothyroidism Cont home synthroid dose    Discharge Diagnoses:  Principal Problem:   Chest pain Active Problems:   Hypothyroidism   Diabetes with neurologic complications ([place])   Chronic diastolic heart failure (HCC)   Positive D dimer   Elevated d-dimer   Elevated troponin    Discharge Condition: stable  Diet recommendation: heart  healthy/American diabetic Association       Filed Weights   03/05/17 0146 03/06/17 0611 03/07/17 0412  Weight: 195 lb 4.8 oz (88.6 kg) 193 lb 9.6 oz (87.8 kg) 192 lb 12.8 oz (87.5 kg)    History of present illness:  66 y.o.femalewith a history of HTN, DM, breast cancer status post mastectomy, hypothyroidism, and chronic diastolic CHF who presented to the ED w/ atypical CP for more than a month (pressure-like, constant, worse with movements, radiating to the right arm, and associated with mild dyspnea and nausea).   In the ED EKG noted normal sinus rhythm and chest x-ray was negative for acute cardiopulmonary disease. Initial troponin was undetectable. D-dimer was elevated at 0.84 and second troponin was borderline at 0.03. CTa was not able to be done due to "poor IV access." Patient with atypical chest pain seen by cardiology deemed no further workup required. Will follow up with cardiology see recommendations above.   Procedures 11/25 Echocardiogram:Left ventricle: mild LVH. -LVEF =-55% to 60%. Negative WMA  Consultations: Cardiology    Discharge Exam:       Vitals:   03/07/17 0747 03/07/17 1000 03/07/17 1118 03/07/17 1400  BP: 137/70 134/80 136/83 (!) 144/72  Pulse: (!) 51 72 98 92  Resp:   18 18  Temp: 97.6 F (36.4 C) 97.8 F (36.6 C) 98 F (36.7 C) 98 F (36.7 C)  TempSrc: Oral Oral Oral Oral  SpO2: 95% 95% 95% 95%  Weight:      Height:        General: a/O 4, negative acute respiratory distress Cardiovascular: regular rhythm and rate, negative murmurs rubs or gallops, normal S1/S2 Respiratory: clear to auscultation bilateral  Discharge Instructions  Allergies as of 03/07/2017      Reactions   Dilaudid [hydromorphone Hcl] Nausea And Vomiting   Lipitor [atorvastatin] Other (See Comments)   Body & Muscle Aches   Adhesive [tape] Rash   Ampicillin Rash   REACTION: Rash and welts on her hands when she took it in the  hospital   Penicillins Rash              Medication List     STOP taking these medications   furosemide 40 MG tablet Commonly known as:  LASIX   losartan-hydrochlorothiazide 50-12.5 MG tablet Commonly known as:  HYZAAR   naproxen sodium 220 MG tablet Commonly known as:  ALEVE   polyethylene glycol packet Commonly known as:  MIRALAX / GLYCOLAX     TAKE these medications   acetaminophen 500 MG tablet Commonly known as:  TYLENOL Take 500 mg by mouth every 4 (four) hours as needed for mild pain or headache.   aspirin 81 MG tablet Take 81 mg by mouth daily.   baclofen 10 MG tablet Commonly known as:  LIORESAL Take 10 mg by mouth 3 (three) times daily as needed for muscle spasms.   Blood Glucose System Pak Kit Use as directed to monitor FSBS 3x daily. Dx: E11.9. Please dispense [employer] True Metrix.   BLOOD GLUCOSE TEST STRIPS Strp Use as directed to monitor FSBS 3x daily. Dx: E11.9. Please dispense [employer] True Metrix.   cyclobenzaprine 10 MG tablet Commonly known as:  FLEXERIL Take 1 tablet (10 mg total) by mouth 3 (three) times daily as needed for muscle spasms.   Diclofenac-Misoprostol 50-0.2 MG Tbec Take 1 tablet by mouth 3 (three) times daily after meals.   fluticasone 50 MCG/ACT nasal spray Commonly known as:  FLONASE Place 2 sprays into both nostrils daily.   gabapentin 300 MG capsule Commonly known as:  NEURONTIN Take 1 capsule (300 mg total) by mouth 3 (three) times daily.   hydrochlorothiazide 12.5 MG capsule Commonly known as:  MICROZIDE Take 1 capsule (12.5 mg total) by mouth daily. Start taking on:  03/08/2017   Insulin Glargine 300 UNIT/ML Sopn Commonly known as:  TOUJEO SOLOSTAR Inject 80 Units into the skin at bedtime.   Lancet Devices Misc Use as directed to monitor FSBS 3x daily. Dx: E11.9. Please dispense TruePlus Ultra Thin.   Lancets Misc Use as directed to monitor FSBS 3x daily. Dx: E11.9. Please dispense TruePlus  Ultra Thin.   LAXATIVE FORMULA Tabs Take 1 tablet by mouth as needed (for BM).   levothyroxine 100 MCG tablet Commonly known as:  SYNTHROID, LEVOTHROID Take 1 tablet (100 mcg total) by mouth daily.   losartan 50 MG tablet Commonly known as:  COZAAR Take 1 tablet (50 mg total) by mouth daily. Start taking on:  03/08/2017   meclizine 12.5 MG tablet Commonly known as:  ANTIVERT Take 1 tablet (12.5 mg total) by mouth 3 (three) times daily as needed for dizziness.   metFORMIN 850 MG tablet Commonly known as:  GLUCOPHAGE Take 1 tablet (850 mg total) by mouth 2 (two) times daily with a meal.   Pen Needles 5/16" 30G X 8 MM Misc Use to inject insulin 5x daily.   ranitidine 150 MG capsule Commonly known as:  ZANTAC Take 150 mg by mouth 2 (two) times daily.   TRUE METRIX LEVEL 2 Normal Soln Use as directed.   Vitamin D3 5000 units Caps Take 5,000 Units by mouth 2 (two) times daily.     Patient is  here today for follow-up.  She has seen 2 separate endocrinologist due to my inability to control her blood sugar secondary to noncompliance.  She was seeing Dr. Bubba Camp and Dr. Loanne Drilling at different points in the past.  She has been noncompliant with her medication.  She readily admits this.  She is not checking her blood sugars.  Over the last few weeks, she has resumed her metformin along with her Toujeo.  She is taking 80 units a day.  However she is only checked her blood sugars the last 2 days.  Her fasting blood sugars have been between 101 130.  Her 2-hour postprandial sugars are less than 160.  However I only have 4 readings to evaluate.  Still, with compliance with her medication, it seems that her sugars will be controlled if this trend continues.  Her blood pressure today is extremely high however she is not taking hydrochlorothiazide.  She is taking losartan.  She is not sure why she is not taking hydrochlorothiazide.  At that time, my plan was: Patient had a stress test while  in the hospital.  I have copied the results below: IMPRESSION: 1. No reversible ischemia. Difficult to exclude fixed defects and infarcts involving the lateral and inferolateral wall.  2. Mild hypokinesia, particularly along the inferior wall.  3. Left ventricular ejection fraction is 47%.  4. Non invasive risk stratification*:   Patient is concerned about the cause of the mild hypokinesis noted along the inferior wall and the suppressed ejection fraction.  I have recommended compliance with her medical therapy to prevent this from worsening.  First and foremost, we need to gain control of her blood pressure.  I want her to resume hydrochlorothiazide 25 mg a day and recheck blood pressure in 2 weeks.  Second we need to ensure that her sugars are under good control.  Essentially we will start from scratch.  I will again try to manage her blood sugars after having a long discussion with the patient stressing the need for compliance and follow-up.  She is to check her fasting blood sugar and 2-hour postprandial sugars for the next week and provide the readings for me to ensure adequate control.  Her goal fasting blood sugars are between 80 and 130.  Her goal 2-hour postprandial sugars are between 80 and 160.  If she maintains these readings, I would make no further changes in her diabetic medication.  I would recommend repeat hemoglobin A1c in 3 months.  03/31/17 Patient is here today for follow-up. Patient has resumed hydrochlorothiazide. She call me back shortly thereafter and reported blood pressures that were elevated. Therefore I increased her losartan 100 mg a day as well. Her blood pressure today is well controlled at 134/72. She is currently toujeo 80 units a day. Her fasting blood sugars are averaging between 116 and 170. Her two-hour postprandial sugars are between 120 and 200. Sugars are much better than not yet at goal. She continues to endorse substernal chest pain and pressure. Pain is  exacerbated by pressure on the costochondral junction. However the pain occurs at random. She described as an intense weight in the center of her chest. Food does not make it worse. She denies any heartburn. Extensive workup in the hospital was negative. Costochondritis is on the differential diagnosis as well  Current Outpatient Medications on File Prior to Visit  Medication Sig Dispense Refill  . acetaminophen (TYLENOL) 500 MG tablet Take 500 mg by mouth every 4 (four) hours as needed for  mild pain or headache.    [name] Kitchen aspirin 81 MG tablet Take 81 mg by mouth daily.    . baclofen (LIORESAL) 10 MG tablet Take 10 mg by mouth 3 (three) times daily as needed for muscle spasms.    . Blood Glucose Calibration (TRUE METRIX LEVEL 2) Normal SOLN Use as directed. 3 each 3  . Blood Glucose Monitoring Suppl (BLOOD GLUCOSE SYSTEM PAK) KIT Use as directed to monitor FSBS 3x daily. Dx: E11.9. Please dispense [employer] True Metrix. 1 each 1  . Cholecalciferol (VITAMIN D3) 5000 UNITS CAPS Take 5,000 Units by mouth 2 (two) times daily.    . cyclobenzaprine (FLEXERIL) 10 MG tablet Take 1 tablet (10 mg total) by mouth 3 (three) times daily as needed for muscle spasms. 20 tablet 0  . diclofenac (CATAFLAM) 50 MG tablet Take 50 mg by mouth 3 (three) times daily.    . Diclofenac-Misoprostol 50-0.2 MG TBEC Take 1 tablet by mouth 3 (three) times daily after meals. 60 tablet 1  . fluticasone (FLONASE) 50 MCG/ACT nasal spray Place 2 sprays into both nostrils daily. 48 g 3  . gabapentin (NEURONTIN) 300 MG capsule Take 1 capsule (300 mg total) by mouth 3 (three) times daily. 90 capsule 3  . Glucose Blood (BLOOD GLUCOSE TEST STRIPS) STRP Use as directed to monitor FSBS 3x daily. Dx: E11.9. Please dispense [employer] True Metrix. 300 each 3  . hydrochlorothiazide (MICROZIDE) 12.5 MG capsule Take 1 capsule (12.5 mg total) by mouth daily. 30 capsule 0  . Insulin Glargine (TOUJEO SOLOSTAR) 300 UNIT/ML SOPN Inject 80 Units into the skin at  bedtime. 3 mL 0  . Insulin Pen Needle (PEN NEEDLES 5/16") 30G X 8 MM MISC Use to inject insulin 5x daily. 200 each 11  . Lancet Devices MISC Use as directed to monitor FSBS 3x daily. Dx: E11.9. Please dispense TruePlus Ultra Thin. 1 each 1  . Lancets MISC Use as directed to monitor FSBS 3x daily. Dx: E11.9. Please dispense TruePlus Ultra Thin. 300 each 3  . levothyroxine (SYNTHROID, LEVOTHROID) 100 MCG tablet Take 1 tablet (100 mcg total) by mouth daily. 30 tablet 5  . losartan (COZAAR) 50 MG tablet Take 1 tablet (50 mg total) by mouth daily. 30 tablet 0  . meclizine (ANTIVERT) 12.5 MG tablet Take 1 tablet (12.5 mg total) by mouth 3 (three) times daily as needed for dizziness. 30 tablet 0  . metFORMIN (GLUCOPHAGE) 850 MG tablet Take 1 tablet (850 mg total) by mouth 2 (two) times daily with a meal. 180 tablet 3  . Misc Natural Products (LAXATIVE FORMULA) TABS Take 1 tablet by mouth as needed (for BM).    . ranitidine (ZANTAC) 150 MG capsule Take 150 mg by mouth 2 (two) times daily.     No current facility-administered medications on file prior to visit.     Allergies  Allergen Reactions  . Dilaudid [Hydromorphone Hcl] Nausea And Vomiting  . Lipitor [Atorvastatin] Other (See Comments)    Body & Muscle Aches  . Adhesive [Tape] Rash  . Ampicillin Rash    REACTION: Rash and welts on her hands when she took it in the hospital  . Penicillins Rash   Social History   Socioeconomic History  . Marital status: Married    Spouse name: Not on file  . Number of children: 1  . Years of education: Not on file  . Highest education level: Not on file  Social Needs  . Financial resource strain: Not on file  .  Food insecurity - worry: Not on file  . Food insecurity - inability: Not on file  . Transportation needs - medical: Not on file  . Transportation needs - non-medical: Not on file  Occupational History  . Occupation: Retired since 2003  Tobacco Use  . Smoking status: Never Smoker  .  Smokeless tobacco: Never Used  Substance and Sexual Activity  . Alcohol use: No  . Drug use: No  . Sexual activity: Not on file  Other Topics Concern  . Not on file  Social History Narrative  . Not on file   Review of Systems  All other systems reviewed and are negative.    Review of Systems  All other systems reviewed and are negative.      Objective:   Physical Exam  Constitutional: She is oriented to person, place, and time. She appears well-developed and well-nourished. No distress.  Neck: Neck supple. No JVD present. No thyromegaly present.  Cardiovascular: Normal rate, regular rhythm and normal heart sounds. Exam reveals no gallop and no friction rub.  No murmur heard. Pulmonary/Chest: Effort normal and breath sounds normal. No respiratory distress. She has no wheezes. She has no rales. She exhibits no tenderness.  Abdominal: Soft. Bowel sounds are normal. She exhibits no distension and no mass. There is no tenderness. There is no rebound and no guarding.  Lymphadenopathy:    She has no cervical adenopathy.  Neurological: She is alert and oriented to person, place, and time.  Skin: She is not diaphoretic.  Vitals reviewed.         Assessment & Plan:  Type 2 diabetes mellitus with diabetic polyneuropathy, with long-term current use of insulin (HCC)  Essential hypertension  Atypical chest pain  I am satisfied with the patient's blood pressure. I will make no further changes in her blood pressure medication. Blood sugar is much better but still not yet at goal. I will increase her insulin and switch her toujeo to 50 units bid and recheck sugars in one week. Her persistent chest pain is atypical. Differential diagnosis includes costochondritis versus a gastrointestinal source. Given her age and the persistence of her chest pain, I have recommended a GI consultation for EGD to rule out gastrointestinal pathology. If GI consultation is negative, I would then diagnosed  her with musculoskeletal chest wall pain.

## 2017-04-05 DIAGNOSIS — G43909 Migraine, unspecified, not intractable, without status migrainosus: Secondary | ICD-10-CM | POA: Insufficient documentation

## 2017-04-05 DIAGNOSIS — M1990 Unspecified osteoarthritis, unspecified site: Secondary | ICD-10-CM | POA: Insufficient documentation

## 2017-04-05 DIAGNOSIS — R112 Nausea with vomiting, unspecified: Secondary | ICD-10-CM | POA: Insufficient documentation

## 2017-04-05 DIAGNOSIS — C50919 Malignant neoplasm of unspecified site of unspecified female breast: Secondary | ICD-10-CM | POA: Insufficient documentation

## 2017-04-05 DIAGNOSIS — Z9889 Other specified postprocedural states: Secondary | ICD-10-CM | POA: Insufficient documentation

## 2017-04-05 DIAGNOSIS — G8929 Other chronic pain: Secondary | ICD-10-CM | POA: Insufficient documentation

## 2017-04-05 DIAGNOSIS — K589 Irritable bowel syndrome without diarrhea: Secondary | ICD-10-CM | POA: Insufficient documentation

## 2017-04-05 DIAGNOSIS — K219 Gastro-esophageal reflux disease without esophagitis: Secondary | ICD-10-CM | POA: Insufficient documentation

## 2017-04-05 DIAGNOSIS — M545 Low back pain, unspecified: Secondary | ICD-10-CM | POA: Insufficient documentation

## 2017-04-05 DIAGNOSIS — G629 Polyneuropathy, unspecified: Secondary | ICD-10-CM | POA: Insufficient documentation

## 2017-04-05 DIAGNOSIS — M5430 Sciatica, unspecified side: Secondary | ICD-10-CM | POA: Insufficient documentation

## 2017-04-05 DIAGNOSIS — Z8489 Family history of other specified conditions: Secondary | ICD-10-CM | POA: Insufficient documentation

## 2017-04-05 DIAGNOSIS — Z8719 Personal history of other diseases of the digestive system: Secondary | ICD-10-CM | POA: Insufficient documentation

## 2017-04-05 DIAGNOSIS — Z9011 Acquired absence of right breast and nipple: Secondary | ICD-10-CM | POA: Insufficient documentation

## 2017-04-05 DIAGNOSIS — E1159 Type 2 diabetes mellitus with other circulatory complications: Secondary | ICD-10-CM | POA: Insufficient documentation

## 2017-04-05 DIAGNOSIS — Z87442 Personal history of urinary calculi: Secondary | ICD-10-CM | POA: Insufficient documentation

## 2017-04-05 DIAGNOSIS — I10 Essential (primary) hypertension: Secondary | ICD-10-CM | POA: Insufficient documentation

## 2017-04-12 ENCOUNTER — Encounter (INDEPENDENT_AMBULATORY_CARE_PROVIDER_SITE_OTHER): Payer: Self-pay | Admitting: Specialist

## 2017-04-12 ENCOUNTER — Ambulatory Visit (INDEPENDENT_AMBULATORY_CARE_PROVIDER_SITE_OTHER): Payer: Medicare HMO | Admitting: Specialist

## 2017-04-12 ENCOUNTER — Ambulatory Visit (INDEPENDENT_AMBULATORY_CARE_PROVIDER_SITE_OTHER): Payer: Medicare HMO

## 2017-04-12 VITALS — BP 131/85 | HR 62 | Ht 65.0 in | Wt 209.0 lb

## 2017-04-12 DIAGNOSIS — M8588 Other specified disorders of bone density and structure, other site: Secondary | ICD-10-CM

## 2017-04-12 DIAGNOSIS — G8929 Other chronic pain: Secondary | ICD-10-CM | POA: Diagnosis not present

## 2017-04-12 DIAGNOSIS — M5441 Lumbago with sciatica, right side: Secondary | ICD-10-CM

## 2017-04-12 DIAGNOSIS — S32010S Wedge compression fracture of first lumbar vertebra, sequela: Secondary | ICD-10-CM | POA: Diagnosis not present

## 2017-04-12 DIAGNOSIS — M25561 Pain in right knee: Secondary | ICD-10-CM | POA: Diagnosis not present

## 2017-04-12 MED ORDER — CALCIUM CARB-CHOLECALCIFEROL 600-800 MG-UNIT PO CHEW: 1.0000 | CHEWABLE_TABLET | Freq: Two times a day (BID) | ORAL | 6 refills | Status: DC

## 2017-04-12 NOTE — Patient Instructions (Signed)
Avoid bending, stooping and avoid lifting weights greater than 10 lbs. Avoid prolong standing and walking. Avoid frequent bending and stooping  No lifting greater than 10 lbs. May use ice or moist heat for pain. Weight loss is of benefit. Handicap license is approved.  

## 2017-04-12 NOTE — Progress Notes (Signed)
Office Visit Note   [name]           Date of Birth: 12-12-1950           MRN: [id number] Visit Date: 04/12/2017              Requested by: Susy Frizzle, MD [address] [place], [place] [address] PCP: Susy Frizzle, MD   Assessment & Plan: Visit Diagnoses:  1. Chronic right-sided low back pain with right-sided sciatica   2. Osteopenia of lumbar spine   3. Compression fracture of L1 lumbar vertebra, sequela     Plan: Avoid bending, stooping and avoid lifting weights greater than 10 lbs. Avoid prolong standing and walking. Avoid frequent bending and stooping  No lifting greater than 10 lbs. May use ice or moist heat for pain. Weight loss is of benefit. Handicap license is approved.  Follow-Up Instructions: Return in about 4 weeks (around 05/10/2017).   Orders:  Orders Placed This Encounter  Procedures  . XR Lumbar Spine Complete W/Bend  . DG BONE DENSITY (DXA)   Meds ordered this encounter  Medications  . Calcium Carb-Cholecalciferol (CALTRATE 600+D3 SOFT) 600-800 MG-UNIT CHEW    Sig: Chew 1 tablet by mouth 2 (two) times daily after a meal.    Dispense:  60 tablet    Refill:  6      Procedures: No procedures performed   Clinical Data: No additional findings.   Subjective: Chief Complaint  Patient presents with  . Neck - Follow-up  . Right Shoulder - Follow-up    Had right shoulder injection on 02/27/2017 and said it has helped her.    67 year old female with persisting low back pain and radiation into the hips, buttocks and thighs. She has difficulty reaching her shoes and socks especially on the right side.  She is able to shower and uses assist devices to help her reach the feet. The pain improves with sitting down. Bowel and bladder are fine. She can only walk in her house and to the mail box and up the road a little ways with a walking stick but not a block.  Sitting improves the pain. She has trouble even standing to  read literature in church. Has to sit even when preparing food. Has tingling in the right leg and thigh  Anterolateral and anterior thigh into the right knee, occasionally into the right anterior shin.    Review of Systems  Constitutional: Negative.   HENT: Negative.   Eyes: Negative.   Respiratory: Negative.   Cardiovascular: Negative.   Gastrointestinal: Negative.   Endocrine: Negative.   Genitourinary: Negative.   Musculoskeletal: Negative.   Skin: Negative.   Allergic/Immunologic: Negative.   Neurological: Negative.   Hematological: Negative.   Psychiatric/Behavioral: Negative.      Objective: Vital Signs: BP 131/85 (BP Location: Left Arm, Patient Position: Sitting)   Pulse 62   Ht 5\' 5"  (1.651 m)   Wt 209 lb (94.8 kg)   BMI 34.78 kg/m   Physical Exam  Constitutional: She is oriented to person, place, and time. She appears well-developed and well-nourished.  HENT:  Head: Normocephalic and atraumatic.  Eyes: EOM are normal. Pupils are equal, round, and reactive to light.  Neck: Normal range of motion. Neck supple.  Pulmonary/Chest: Effort normal and breath sounds normal.  Abdominal: Soft. Bowel sounds are normal.  Musculoskeletal: Normal range of motion.  Neurological: She is alert and oriented to person, place, and  time.  Skin: Skin is warm and dry.  Psychiatric: She has a normal mood and affect. Her behavior is normal. Judgment and thought content normal.    Back Exam   Tenderness  The patient is experiencing tenderness in the lumbar.  Range of Motion  Extension: normal  Flexion: normal  Lateral bend right: normal  Lateral bend left: normal  Rotation right: normal  Rotation left: normal   Muscle Strength  Right Quadriceps:  4/5  Left Quadriceps:  5/5  Right Hamstrings:  5/5  Left Hamstrings:  5/5   Tests  Straight leg raise right: negative Straight leg raise left: negative  Reflexes  Patellar: normal Achilles: normal Biceps:  normal Babinski's sign: normal   Other  Toe walk: normal Heel walk: normal Sensation: decreased Gait: abnormal  Erythema: no back redness Scars: absent      Specialty Comments:  No specialty comments available.  Imaging: No results found.   PMFS History: Patient Active Problem List   Diagnosis Date Noted  . HNP (herniated nucleus pulposus), lumbar 08/22/2014    Priority: High    Class: Chronic  . Herniated nucleus pulposus, L2-3 right 02/04/2014    Priority: High    Class: Acute  . Lumbar stenosis with neurogenic claudication 04/30/2012    Priority: High    Class: Chronic  . Type II diabetes mellitus ([facility])   . Sciatica   . PONV (postoperative nausea and vomiting)   . Peripheral neuropathy   . Osteoarthritis   . Migraines   . Irritable bowel syndrome   . Hypertension   . History of right mastectomy   . History of kidney stones   . H/O hiatal hernia   . GERD (gastroesophageal reflux disease)   . Family history of anesthesia complication   . Chronic lower back pain   . Breast cancer ([facility])   . Arthritis   . Diabetes mellitus type 2, uncontrolled, with complications ([facility])   . Lower extremity edema   . Positive D dimer 03/05/2017  . Elevated d-dimer   . Elevated troponin   . Chronic constipation 06/23/2015  . Obesity 06/12/2015  . Chronic diastolic heart failure ([facility]) 11/17/2014  . Urinary retention 05/04/2012    Class: Temporary  . Spondylolisthesis of lumbar region 04/30/2012    Class: Chronic  . Chest pain 03/20/2012  . Anginal pain ([facility]) 03/19/2012  . Right shoulder pain 12/07/2010  . ABDOMINAL PAIN, LOWER 09/21/2009  . BACK PAIN 03/18/2009  . SYNCOPE-CAROTID SINUS 09/16/2008  . Essential hypertension 08/05/2008  . Hypothyroidism 08/23/2006  . Diabetes with neurologic complications ([facility]) 16/01/9603  . Diabetic neuropathy ([facility]) 08/23/2006  . CARDIOMYOPATHY 08/23/2006  . GERD 08/23/2006  . OSTEOARTHRITIS 08/23/2006  . DDD (degenerative disc  disease), cervical 08/23/2006  . [place] DISEASE, LUMBAR 08/23/2006  . Headache(784.0) 08/23/2006  . BREAST CANCER, HX OF 08/23/2006  . HYPERPARATHYROIDISM, HX OF 08/23/2006  . RENAL CALCULUS, HX OF 08/23/2006   Past Medical History:  Diagnosis Date  . Anginal pain ([facility]) 03/19/2012   saw Dr. Cathie Olden .. she thinks its more related to stomach issues  . Arthritis    "all over; mostly in my back" (03/20/2012)  . Breast cancer ([facility])    "right" (03/20/2012)  . Cardiomyopathy   . Chest pain 06/2005   Hospitalized, dystolic dysfunction  . Chronic lower back pain    "have 2 herniated discs; going to have to have a fusion" (03/20/2012)  . Family history of anesthesia complication    "father has nausea  and vomiting"  . GERD (gastroesophageal reflux disease)   . H/O hiatal hernia   . History of kidney stones   . History of right mastectomy   . Hypertension    "patient states never had HTN, takes Hyzaar for heart  . Hypothyroidism   . Irritable bowel syndrome   . Migraines    "it's been a long time since I've had one" (03/20/2012)  . Osteoarthritis   . Peripheral neuropathy   . PONV (postoperative nausea and vomiting)   . Sciatica   . Type II diabetes mellitus ([facility])     Family History  Problem Relation Age of Onset  . Hypertension Mother   . Diabetes Father   . Hypertension Father   . Hypertension Sister   . Hypertension Brother   . Hypertension Brother   . Diabetes Brother   . Diabetes Other        Family Hx. of Diabetes  . Hypertension Other        Family History of HTN  . Deep vein thrombosis Daughter     Past Surgical History:  Procedure Laterality Date  . ABDOMINAL HYSTERECTOMY  1993  . adenosin cardiolite  07/2005   (+) wall motion abnormality  . AXILLARY LYMPH NODE DISSECTION  2003   squamous cell cancer  . BACK SURGERY    . CARDIAC CATHETERIZATION  ? 2005 / 2007  . CT abdomen and pelvis  02/2010   Same  . ESOPHAGOGASTRODUODENOSCOPY  2005   GERD  . KNEE  ARTHROSCOPY  1990's   "right" (03/20/2012)  . LUMBAR FUSION N/A 08/22/2014   Procedure: Right L2-3 and right L1-2 transforaminal lumbar interbody fusion with pedicle screws, rods, sleeves, cages, local bone graft, Vivigen, cancellous chips;  Surgeon: Jessy Oto, MD;  Location: [facility];  Service: Orthopedics;  Laterality: N/A;  . LUMBAR LAMINECTOMY/DECOMPRESSION MICRODISCECTOMY N/A 02/04/2014   Procedure: RIGHT L2-3 MICRODISCECTOMY ;  Surgeon: Jessy Oto, MD;  Location: [facility];  Service: Orthopedics;  Laterality: N/A;  . MASTECTOMY WITH AXILLARY LYMPH NODE DISSECTION  12/2003   "right" (03/20/2012)  . POSTERIOR CERVICAL FUSION/FORAMINOTOMY  2001  . SHOULDER ARTHROSCOPY W/ ROTATOR CUFF REPAIR  2003   Left  . THYROIDECTOMY  1977   Right  . TOTAL KNEE ARTHROPLASTY  2000   Right  . [place] of abdomen  07/2005   Fatty liver / kidney stones   Social History   Occupational History  . Occupation: Retired since 2003  Tobacco Use  . Smoking status: Never Smoker  . Smokeless tobacco: Never Used  Substance and Sexual Activity  . Alcohol use: No  . Drug use: No  . Sexual activity: Not on file

## 2017-04-26 ENCOUNTER — Ambulatory Visit: Payer: Medicare HMO | Admitting: Cardiology

## 2017-04-26 ENCOUNTER — Encounter: Payer: Self-pay | Admitting: Cardiology

## 2017-04-26 VITALS — BP 140/82 | HR 76 | Ht 64.0 in | Wt 198.0 lb

## 2017-04-26 DIAGNOSIS — R079 Chest pain, unspecified: Secondary | ICD-10-CM | POA: Diagnosis not present

## 2017-04-26 DIAGNOSIS — I10 Essential (primary) hypertension: Secondary | ICD-10-CM

## 2017-04-26 NOTE — Progress Notes (Signed)
Cardiology Office Note   Date:  04/26/2017   ID:  Desiree, Pratt 11/18/1950, MRN [id number]  PCP:  Susy Frizzle, MD  Cardiologist:  [name], MD    Chief Complaint  Patient presents with  . Follow-up    Chest Pain     History of Present Illness: [name] is a 67 y.o. female who presents today for cardiology evaluation.   Hx HTN, DM uncontrolled.  She says the beginning of June, she had an episode of body aches that were severe and all over her body. She eventually was taken off of her Lipitor, which has significantly helped her symptoms. She does say that she gets some amount of chest pain when she exerts herself. The pain is in the center for chest and relieved when she rests. She also says that she has a different pain in her chest that occurs most of the time. She is getting some lower extremity edema at times. She also says that she snores at night and stops breathing when she sleeps at times.  Today, denies symptoms of palpitations, shortness of breath, orthopnea, PND, lower extremity edema, claudication, dizziness, presyncope, syncope, bleeding, or neurologic sequela. The patient is tolerating medications without difficulties.  She is continued to have chest pain.  The pain  occurs both at rest and with stress.  It is a burning, squeezing pain in the center of her chest.  She is currently on Zantac.  She has had a stress test in the past that was read as low risk.   Past Medical History:  Diagnosis Date  . Anginal pain ([facility]) 03/19/2012   saw Dr. Cathie Olden .. she thinks its more related to stomach issues  . Arthritis    "all over; mostly in my back" (03/20/2012)  . Breast cancer ([facility])    "right" (03/20/2012)  . Cardiomyopathy   . Chest pain 06/2005   Hospitalized, dystolic dysfunction  . Chronic lower back pain    "have 2 herniated discs; going to have to have a fusion" (03/20/2012)  . Family history of anesthesia complication    "father has  nausea and vomiting"  . GERD (gastroesophageal reflux disease)   . H/O hiatal hernia   . History of kidney stones   . History of right mastectomy   . Hypertension    "patient states never had HTN, takes Hyzaar for heart  . Hypothyroidism   . Irritable bowel syndrome   . Migraines    "it's been a long time since I've had one" (03/20/2012)  . Osteoarthritis   . Peripheral neuropathy   . PONV (postoperative nausea and vomiting)   . Sciatica   . Type II diabetes mellitus ([facility])    Past Surgical History:  Procedure Laterality Date  . ABDOMINAL HYSTERECTOMY  1993  . adenosin cardiolite  07/2005   (+) wall motion abnormality  . AXILLARY LYMPH NODE DISSECTION  2003   squamous cell cancer  . BACK SURGERY    . CARDIAC CATHETERIZATION  ? 2005 / 2007  . CT abdomen and pelvis  02/2010   Same  . ESOPHAGOGASTRODUODENOSCOPY  2005   GERD  . KNEE ARTHROSCOPY  1990's   "right" (03/20/2012)  . LUMBAR FUSION N/A 08/22/2014   Procedure: Right L2-3 and right L1-2 transforaminal lumbar interbody fusion with pedicle screws, rods, sleeves, cages, local bone graft, Vivigen, cancellous chips;  Surgeon: Jessy Oto, MD;  Location: [facility];  Service: Orthopedics;  Laterality: N/A;  .  LUMBAR LAMINECTOMY/DECOMPRESSION MICRODISCECTOMY N/A 02/04/2014   Procedure: RIGHT L2-3 MICRODISCECTOMY ;  Surgeon: Jessy Oto, MD;  Location: [facility];  Service: Orthopedics;  Laterality: N/A;  . MASTECTOMY WITH AXILLARY LYMPH NODE DISSECTION  12/2003   "right" (03/20/2012)  . POSTERIOR CERVICAL FUSION/FORAMINOTOMY  2001  . SHOULDER ARTHROSCOPY W/ ROTATOR CUFF REPAIR  2003   Left  . THYROIDECTOMY  1977   Right  . TOTAL KNEE ARTHROPLASTY  2000   Right  . [place] of abdomen  07/2005   Fatty liver / kidney stones     Current Outpatient Medications  Medication Sig Dispense Refill  . acetaminophen (TYLENOL) 500 MG tablet Take 500 mg by mouth every 4 (four) hours as needed for mild pain or headache.    [name] Kitchen aspirin 81 MG tablet  Take 81 mg by mouth daily.    . Calcium Carb-Cholecalciferol (CALTRATE 600+D3 SOFT) 600-800 MG-UNIT CHEW Chew 1 tablet by mouth 2 (two) times daily after a meal. 60 tablet 6  . fluticasone (FLONASE) 50 MCG/ACT nasal spray Place 2 sprays into both nostrils daily. 48 g 3  . gabapentin (NEURONTIN) 300 MG capsule Take 1 capsule (300 mg total) by mouth 3 (three) times daily. 90 capsule 3  . hydrochlorothiazide (MICROZIDE) 12.5 MG capsule Take 1 capsule (12.5 mg total) by mouth daily. 30 capsule 0  . Insulin Glargine (TOUJEO SOLOSTAR) 300 UNIT/ML SOPN Inject 80 Units into the skin at bedtime. 3 mL 0  . levothyroxine (SYNTHROID, LEVOTHROID) 100 MCG tablet Take 1 tablet (100 mcg total) by mouth daily. 30 tablet 5  . losartan (COZAAR) 50 MG tablet Take 1 tablet (50 mg total) by mouth daily. 30 tablet 0  . meclizine (ANTIVERT) 12.5 MG tablet Take 1 tablet (12.5 mg total) by mouth 3 (three) times daily as needed for dizziness. 30 tablet 0  . metFORMIN (GLUCOPHAGE) 850 MG tablet Take 1 tablet (850 mg total) by mouth 2 (two) times daily with a meal. 180 tablet 3  . Misc Natural Products (LAXATIVE FORMULA) TABS Take 1 tablet by mouth as needed (for BM).    . ranitidine (ZANTAC) 150 MG capsule Take 150 mg by mouth 2 (two) times daily.     No current facility-administered medications for this visit.     Allergies:   Dilaudid [hydromorphone hcl]; Lipitor [atorvastatin]; Adhesive [tape]; Ampicillin; and Penicillins   Social History:  The patient  reports that  has never smoked. she has never used smokeless tobacco. She reports that she does not drink alcohol or use drugs.   Family History:  The patient's family history includes Deep vein thrombosis in her daughter; Diabetes in her brother, father, and other; Hypertension in her brother, brother, father, mother, other, and sister.    ROS:  Please see the history of present illness.   Otherwise, review of systems is positive for fatigue, chest pain, leg swelling,  chest pressure, abdominal pain, constipation, nausea, balance problems, back pain, muscle pain, dizziness, headaches.   All other systems are reviewed and negative.   PHYSICAL EXAM: VS:  BP 140/82   Pulse 76   Ht 5\' 4"  (1.626 m)   Wt 198 lb (89.8 kg)   BMI 33.99 kg/m  , BMI Body mass index is 33.99 kg/m. GEN: Well nourished, well developed, in no acute distress  HEENT: normal  Neck: no JVD, carotid bruits, or masses Cardiac: RRR; no murmurs, rubs, or gallops,no edema  Respiratory:  clear to auscultation bilaterally, normal work of breathing GI: soft, nontender, nondistended, +  BS MS: no deformity or atrophy  Skin: warm and dry Neuro:  Strength and sensation are intact Psych: euthymic mood, full affect  EKG:  EKG is not ordered today. Personal review of the ekg ordered 03/06/17 shows SR, rate 53. LVH by voltage   Recent Labs: 12/19/2016: TSH 2.23 03/04/2017: ALT 28; B Natriuretic Peptide 42.9 03/05/2017: Hemoglobin 14.3; Platelets 241 03/07/2017: BUN 17; Creatinine, Ser 0.91; Potassium 3.7; Sodium 138    Lipid Panel     Component Value Date/Time   CHOL 191 06/30/2016 0944   TRIG 245 (H) 06/30/2016 0944   HDL 45 (L) 06/30/2016 0944   CHOLHDL 4.2 06/30/2016 0944   VLDL 49 (H) 06/30/2016 0944   LDLCALC 97 06/30/2016 0944     Wt Readings from Last 3 Encounters:  04/26/17 198 lb (89.8 kg)  04/12/17 209 lb (94.8 kg)  03/31/17 200 lb (90.7 kg)      Other studies Reviewed: Additional studies/ records that were reviewed today include: TTE 03/05/17 Review of the above records today demonstrates:  - Left ventricle: The cavity size was normal. Wall thickness was   increased in a pattern of mild LVH. Systolic function was normal.   The estimated ejection fraction was in the range of 55% to 60%.   Wall motion was normal; there were no regional wall motion   abnormalities. - Aortic valve: There was trivial regurgitation. - Mitral valve: Calcified annulus. Mildly thickened  leaflets . - Left atrium: The atrium was mildly dilated. - Atrial septum: No defect or patent foramen ovale was identified.  Myoview 10/29/15  Nuclear stress EF: 57%.  There was no ST segment deviation noted during stress.  Defect 1: There is a medium defect of moderate severity present in the mid anterolateral and apex location.  Defect 2: There is a medium defect of moderate severity present in the basal inferolateral location.  This is a low risk study.  The left ventricular ejection fraction is normal (55-65%).   Low risk stress nuclear study with probable breast attenuation and thinning of the basal inferior lateral wall; no ischemia; EF 57 with normal wall motion.  ASSESSMENT AND PLAN:  1.  Chest pain: Had Myoview that was read as low risk with defects likely due to breast attenuation.  Continue to have chest pain.  I did discuss with her the possibility of this being GERD.  She Keldon Lassen try to take Tums to see if this helps with her pain.  Should she not get relief, we Rafia Shedden possibly plan for coronary CT scan.  2. Snoring : During has improved.  She does not wish to have a sleep study performed at this time.  3.  Hypertension: High normal today.  No changes.  Current medicines are reviewed at length with the patient today.   The patient does not have concerns regarding her medicines.  The following changes were made today:  none  Labs/ tests ordered today include:  No orders of the defined types were placed in this encounter.    Disposition:   FU with Chase Arnall as needed  Signed, Evelyn Moch Meredith Leeds, MD  04/26/2017 10:25 AM     [address] [id number] [phone] (office) [phone] (fax)

## 2017-04-26 NOTE — Patient Instructions (Signed)
Medication Instructions:  Your physician recommends that you continue on your current medications as directed. Please refer to the Current Medication list given to you today.  * If you need a refill on your cardiac medications before your next appointment, please call your pharmacy.   Labwork: None ordered  Testing/Procedures: None ordered  Follow-Up: No follow up is needed at this time with Dr. Camnitz.  He will see you on an as needed basis.   Thank you for choosing [facility]!!   Petra Sargeant, RN [phone]     

## 2017-04-27 DIAGNOSIS — H04123 Dry eye syndrome of bilateral lacrimal glands: Secondary | ICD-10-CM | POA: Diagnosis not present

## 2017-05-08 ENCOUNTER — Telehealth: Payer: Self-pay | Admitting: Family Medicine

## 2017-05-08 NOTE — Telephone Encounter (Signed)
465 is patients blood sugar at the moment she would like to know if we have any toujeo in the office that she can get. She hasn't been able to go and get this.  CB# [phone]

## 2017-05-09 NOTE — Telephone Encounter (Signed)
D/T pt having a deductible on her ins all insulins are Tier 3 and around $600 until she meets her deductible however the ins person suggested Novolin and she can get it at [facility] for $25. Please advise.

## 2017-05-09 NOTE — Telephone Encounter (Signed)
Switch from toujeo 80 units to novolin 70/30: 45 units qam and 25 units qpm  Would need to uptitrate based on her sugars in 1 week AT OV.

## 2017-05-10 MED ORDER — BD SWAB SINGLE USE REGULAR PADS: MEDICATED_PAD | 3 refills | Status: DC

## 2017-05-10 MED ORDER — TRUE METRIX LEVEL 2 NORMAL VI SOLN: 3 refills | Status: DC

## 2017-05-10 MED ORDER — RANITIDINE HCL 150 MG PO CAPS: 150.0000 mg | ORAL_CAPSULE | Freq: Two times a day (BID) | ORAL | 3 refills | Status: DC

## 2017-05-10 MED ORDER — INSULIN NPH ISOPHANE & REGULAR (70-30) 100 UNIT/ML ~~LOC~~ SUSP: SUBCUTANEOUS | 11 refills | Status: DC

## 2017-05-10 MED ORDER — TRUEPLUS LANCETS 33G MISC: 3 refills | Status: DC

## 2017-05-10 MED ORDER — FLUTICASONE PROPIONATE 50 MCG/ACT NA SUSP: 2.0000 | Freq: Every day | NASAL | 3 refills | Status: DC

## 2017-05-10 MED ORDER — LOSARTAN POTASSIUM 50 MG PO TABS: 50.0000 mg | ORAL_TABLET | Freq: Every day | ORAL | 3 refills | Status: DC

## 2017-05-10 MED ORDER — GLUCOSE BLOOD VI STRP: ORAL_STRIP | 12 refills | Status: DC

## 2017-05-10 MED ORDER — TRUE METRIX AIR GLUCOSE METER W/DEVICE KIT: 1.0000 | PACK | Freq: Three times a day (TID) | 0 refills | Status: DC

## 2017-05-10 NOTE — Addendum Note (Signed)
Addended by: Shary Decamp B on: 05/10/2017 11:51 AM   Modules accepted: Orders

## 2017-05-10 NOTE — Telephone Encounter (Signed)
Patient aware of results and of providers recommendations and apt made

## 2017-05-11 ENCOUNTER — Encounter (INDEPENDENT_AMBULATORY_CARE_PROVIDER_SITE_OTHER): Payer: Self-pay | Admitting: Specialist

## 2017-05-11 ENCOUNTER — Ambulatory Visit (INDEPENDENT_AMBULATORY_CARE_PROVIDER_SITE_OTHER): Payer: Medicare HMO | Admitting: Specialist

## 2017-05-11 VITALS — BP 166/68 | HR 57 | Ht 65.0 in | Wt 209.0 lb

## 2017-05-11 DIAGNOSIS — M5134 Other intervertebral disc degeneration, thoracic region: Secondary | ICD-10-CM | POA: Diagnosis not present

## 2017-05-11 DIAGNOSIS — M7061 Trochanteric bursitis, right hip: Secondary | ICD-10-CM | POA: Diagnosis not present

## 2017-05-11 DIAGNOSIS — Z981 Arthrodesis status: Secondary | ICD-10-CM

## 2017-05-11 DIAGNOSIS — S32010G Wedge compression fracture of first lumbar vertebra, subsequent encounter for fracture with delayed healing: Secondary | ICD-10-CM

## 2017-05-11 DIAGNOSIS — M4807 Spinal stenosis, lumbosacral region: Secondary | ICD-10-CM

## 2017-05-11 NOTE — Progress Notes (Addendum)
Office Visit Note   [name]           Date of Birth: [birth year]           MRN: [id number] Visit Date: 05/11/2017              Requested by: Susy Frizzle, MD [address] [place], [place] [address] PCP: Susy Frizzle, MD   Assessment & Plan: Visit Diagnoses:  1. Closed compression fracture of L1 lumbar vertebra with delayed healing, subsequent encounter   2. Spinal stenosis of lumbosacral region   3. Other intervertebral disc degeneration, thoracic region   4. History of lumbar spinal fusion   5. Greater trochanteric bursitis of right hip     Plan:Avoid frequent bending and stooping  No lifting greater than 10 lbs. May use ice or moist heat for pain. Weight loss is of benefit. Handicap license is approved. MRIs of the lumbar spine and thoracic spine are ordered. Referral for evaluation for possible L1 bone cementing to prevent further compression of the vertebrae Bone density test is ordered. Follow-Up Instructions: Return in about 3 weeks (around 06/01/2017).   Orders:  Orders Placed This Encounter  Procedures  . MR Lumbar Spine W Wo Contrast  . MR Thoracic Spine w/o contrast  . Ambulatory referral to Interventional Radiology   No orders of the defined types were placed in this encounter.     Procedures: No procedures performed   Clinical Data: No additional findings.   Subjective: Chief Complaint  Patient presents with  . Lower Back - Follow-up    66 year old female with persisting Mid and lower back pain. History of diabetes. Pain is severe and is relieved with lying down. She is status post L1 to S1 fusion for DDD, multiple disc Herniations and foramenal stenosis with spondylolisthesis. No bowel or bladder difficulty.     Review of Systems  Constitutional: Negative.   HENT: Negative.   Eyes: Negative.   Respiratory: Negative.   Cardiovascular: Negative.   Gastrointestinal: Negative.   Endocrine: Negative.     Genitourinary: Negative.   Musculoskeletal: Negative.   Skin: Negative.   Allergic/Immunologic: Negative.   Neurological: Negative.   Hematological: Negative.   Psychiatric/Behavioral: Negative.      Objective: Vital Signs: BP (!) 166/68 (BP Location: Left Arm, Patient Position: Sitting)   Pulse (!) 57   Ht 5\' 5"  (1.651 m)   Wt 209 lb (94.8 kg)   BMI 34.78 kg/m   Physical Exam  Constitutional: She is oriented to person, place, and time. She appears well-developed and well-nourished.  HENT:  Head: Normocephalic and atraumatic.  Eyes: EOM are normal. Pupils are equal, round, and reactive to light.  Neck: Normal range of motion. Neck supple.  Pulmonary/Chest: Effort normal and breath sounds normal.  Abdominal: Soft. Bowel sounds are normal.  Neurological: She is alert and oriented to person, place, and time.  Skin: Skin is warm and dry.  Psychiatric: She has a normal mood and affect. Her behavior is normal. Judgment and thought content normal.    Back Exam   Tenderness  The patient is experiencing tenderness in the lumbar and thoracic.  Range of Motion  Extension: abnormal  Flexion: abnormal  Lateral bend right: abnormal  Lateral bend left: abnormal  Rotation right: abnormal  Rotation left: abnormal   Muscle Strength  Right Quadriceps:  5/5  Left Quadriceps:  5/5  Right Hamstrings:  5/5  Left Hamstrings:  5/5  Reflexes  Patellar: Hyporeflexic Achilles: Hyporeflexic Babinski's sign: normal   Other  Toe walk: normal Heel walk: normal Sensation: normal Gait: abnormal  Erythema: no back redness Scars: present      Specialty Comments:  No specialty comments available.  Imaging: No results found.   PMFS History: Patient Active Problem List   Diagnosis Date Noted  . HNP (herniated nucleus pulposus), lumbar 08/22/2014    Priority: High    Class: Chronic  . Herniated nucleus pulposus, L2-3 right 02/04/2014    Priority: High    Class: Acute  .  Lumbar stenosis with neurogenic claudication 04/30/2012    Priority: High    Class: Chronic  . Type II diabetes mellitus ([place])   . Sciatica   . PONV (postoperative nausea and vomiting)   . Peripheral neuropathy   . Osteoarthritis   . Migraines   . Irritable bowel syndrome   . Hypertension   . History of right mastectomy   . History of kidney stones   . H/O hiatal hernia   . GERD (gastroesophageal reflux disease)   . Family history of anesthesia complication   . Chronic lower back pain   . Breast cancer ([facility])   . Arthritis   . Diabetes mellitus type 2, uncontrolled, with complications ([place])   . Lower extremity edema   . Positive D dimer 03/05/2017  . Elevated d-dimer   . Elevated troponin   . Chronic constipation 06/23/2015  . Obesity 06/12/2015  . Chronic diastolic heart failure ([place]) 11/17/2014  . Urinary retention 05/04/2012    Class: Temporary  . Spondylolisthesis of lumbar region 04/30/2012    Class: Chronic  . Chest pain 03/20/2012  . Anginal pain ([place]) 03/19/2012  . Right shoulder pain 12/07/2010  . ABDOMINAL PAIN, LOWER 09/21/2009  . BACK PAIN 03/18/2009  . SYNCOPE-CAROTID SINUS 09/16/2008  . Essential hypertension 08/05/2008  . Hypothyroidism 08/23/2006  . Diabetes with neurologic complications ([place]) 94/85/4627  . Diabetic neuropathy ([place]) 08/23/2006  . CARDIOMYOPATHY 08/23/2006  . GERD 08/23/2006  . OSTEOARTHRITIS 08/23/2006  . DDD (degenerative disc disease), cervical 08/23/2006  . [place] DISEASE, LUMBAR 08/23/2006  . Headache(784.0) 08/23/2006  . BREAST CANCER, HX OF 08/23/2006  . HYPERPARATHYROIDISM, HX OF 08/23/2006  . RENAL CALCULUS, HX OF 08/23/2006   Past Medical History:  Diagnosis Date  . Anginal pain ([place]) 03/19/2012   saw Dr. Cathie Olden .. she thinks its more related to stomach issues  . Arthritis    "all over; mostly in my back" (03/20/2012)  . Breast cancer ([facility])    "right" (03/20/2012)  . Cardiomyopathy   . Chest pain 06/2005    Hospitalized, dystolic dysfunction  . Chronic lower back pain    "have 2 herniated discs; going to have to have a fusion" (03/20/2012)  . Family history of anesthesia complication    "father has nausea and vomiting"  . GERD (gastroesophageal reflux disease)   . H/O hiatal hernia   . History of kidney stones   . History of right mastectomy   . Hypertension    "patient states never had HTN, takes Hyzaar for heart  . Hypothyroidism   . Irritable bowel syndrome   . Migraines    "it's been a long time since I've had one" (03/20/2012)  . Osteoarthritis   . Peripheral neuropathy   . PONV (postoperative nausea and vomiting)   . Sciatica   . Type II diabetes mellitus ([facility])     Family History  Problem Relation Age of Onset  .  Hypertension Mother   . Diabetes Father   . Hypertension Father   . Hypertension Sister   . Hypertension Brother   . Hypertension Brother   . Diabetes Brother   . Diabetes Other        Family Hx. of Diabetes  . Hypertension Other        Family History of HTN  . Deep vein thrombosis Daughter     Past Surgical History:  Procedure Laterality Date  . ABDOMINAL HYSTERECTOMY  1993  . adenosin cardiolite  07/2005   (+) wall motion abnormality  . AXILLARY LYMPH NODE DISSECTION  2003   squamous cell cancer  . BACK SURGERY    . CARDIAC CATHETERIZATION  ? 2005 / 2007  . CT abdomen and pelvis  02/2010   Same  . ESOPHAGOGASTRODUODENOSCOPY  2005   GERD  . KNEE ARTHROSCOPY  1990's   "right" (03/20/2012)  . LUMBAR FUSION N/A 08/22/2014   Procedure: Right L2-3 and right L1-2 transforaminal lumbar interbody fusion with pedicle screws, rods, sleeves, cages, local bone graft, Vivigen, cancellous chips;  Surgeon: Jessy Oto, MD;  Location: [facility];  Service: Orthopedics;  Laterality: N/A;  . LUMBAR LAMINECTOMY/DECOMPRESSION MICRODISCECTOMY N/A 02/04/2014   Procedure: RIGHT L2-3 MICRODISCECTOMY ;  Surgeon: Jessy Oto, MD;  Location: [facility];  Service: Orthopedics;   Laterality: N/A;  . MASTECTOMY WITH AXILLARY LYMPH NODE DISSECTION  12/2003   "right" (03/20/2012)  . POSTERIOR CERVICAL FUSION/FORAMINOTOMY  2001  . SHOULDER ARTHROSCOPY W/ ROTATOR CUFF REPAIR  2003   Left  . THYROIDECTOMY  1977   Right  . TOTAL KNEE ARTHROPLASTY  2000   Right  . [place] of abdomen  07/2005   Fatty liver / kidney stones   Social History   Occupational History  . Occupation: Retired since 2003  Tobacco Use  . Smoking status: Never Smoker  . Smokeless tobacco: Never Used  Substance and Sexual Activity  . Alcohol use: No  . Drug use: No  . Sexual activity: Not on file

## 2017-05-11 NOTE — Patient Instructions (Addendum)
Avoid frequent bending and stooping  No lifting greater than 10 lbs. May use ice or moist heat for pain. Weight loss is of benefit. Handicap license is approved. MRIs of the lumbar spine and thoracic spine are ordered. Referral for evaluation for possible L1 bone cementing to prevent further compression of the vertebrae Bone density test is ordered.

## 2017-05-16 ENCOUNTER — Telehealth (HOSPITAL_COMMUNITY): Payer: Self-pay

## 2017-05-16 NOTE — Telephone Encounter (Signed)
Called pt to set up consult with Dr. Estanislado Pandy. MRI has been ordered but not scheduled. Left message for pt to return call. AW

## 2017-05-17 ENCOUNTER — Other Ambulatory Visit: Payer: Self-pay | Admitting: Family Medicine

## 2017-05-17 MED ORDER — BD SWAB SINGLE USE REGULAR PADS: MEDICATED_PAD | 3 refills | Status: DC

## 2017-05-17 MED ORDER — TRUE METRIX LEVEL 2 NORMAL VI SOLN: 3 refills | Status: DC

## 2017-05-17 MED ORDER — LEVOTHYROXINE SODIUM 100 MCG PO TABS: 100.0000 ug | ORAL_TABLET | Freq: Every day | ORAL | 3 refills | Status: DC

## 2017-05-18 ENCOUNTER — Ambulatory Visit (INDEPENDENT_AMBULATORY_CARE_PROVIDER_SITE_OTHER): Payer: Medicare HMO | Admitting: Family Medicine

## 2017-05-18 ENCOUNTER — Encounter: Payer: Self-pay | Admitting: Family Medicine

## 2017-05-18 VITALS — BP 164/80 | HR 60 | Temp 98.1°F | Resp 18 | Ht 65.0 in | Wt 205.0 lb

## 2017-05-18 DIAGNOSIS — Z794 Long term (current) use of insulin: Secondary | ICD-10-CM

## 2017-05-18 DIAGNOSIS — E1142 Type 2 diabetes mellitus with diabetic polyneuropathy: Secondary | ICD-10-CM | POA: Diagnosis not present

## 2017-05-18 DIAGNOSIS — I10 Essential (primary) hypertension: Secondary | ICD-10-CM

## 2017-05-18 MED ORDER — RANITIDINE HCL 150 MG PO TABS: 150.0000 mg | ORAL_TABLET | Freq: Two times a day (BID) | ORAL | 3 refills | Status: DC

## 2017-05-18 MED ORDER — HYDROCHLOROTHIAZIDE 12.5 MG PO CAPS: 12.5000 mg | ORAL_CAPSULE | Freq: Every day | ORAL | 0 refills | Status: DC

## 2017-05-18 MED ORDER — HYDROCHLOROTHIAZIDE 25 MG PO TABS: 25.0000 mg | ORAL_TABLET | Freq: Every day | ORAL | 3 refills | Status: DC

## 2017-05-18 NOTE — Progress Notes (Signed)
Subjective:    Patient ID: [name], female    DOB: 01-Jul-1950, 67 y.o.   MRN: [id number]  HPI Patient called 1/29 and stated she could no longer afford toujeo 80 units a day and wanted to switch to generic 70/30.  I recommended 45 units in the am and 25 units in the pm.  Here today to titrate insulin dose.  After her last visit, I asked the patient to resume hydrochlorothiazide which she has stopped.  She did resume the hydrochlorothiazide but then discontinued losartan for reasons I am uncertain of.  As a result her blood pressure is elevated today.  She denies any chest pain shortness of breath or dyspnea on exertion.  She is taking her insulin as prescribed however she skips meals frequently.  She is only checked for sugar values.  Each of these readings were in the morning before she ate breakfast.  They are as follows: 260, 210, 170, 160.  Current Outpatient Medications on File Prior to Visit  Medication Sig Dispense Refill  . acetaminophen (TYLENOL) 500 MG tablet Take 500 mg by mouth every 4 (four) hours as needed for mild pain or headache.    . Alcohol Swabs (B-D SINGLE USE SWABS REGULAR) PADS Use with insulin injections 300 each 3  . aspirin 81 MG tablet Take 81 mg by mouth daily.    . Blood Glucose Calibration (TRUE METRIX LEVEL 2) Normal SOLN Use as directed 3 each 3  . Blood Glucose Monitoring Suppl (TRUE METRIX AIR GLUCOSE METER) w/Device KIT 1 kit by Does not apply route 3 (three) times daily. 1 kit 0  . Calcium Carb-Cholecalciferol (CALTRATE 600+D3 SOFT) 600-800 MG-UNIT CHEW Chew 1 tablet by mouth 2 (two) times daily after a meal. 60 tablet 6  . fluticasone (FLONASE) 50 MCG/ACT nasal spray Place 2 sprays into both nostrils daily. 48 g 3  . gabapentin (NEURONTIN) 300 MG capsule Take 1 capsule (300 mg total) by mouth 3 (three) times daily. 90 capsule 3  . glucose blood (TRUE METRIX BLOOD GLUCOSE TEST) test strip Use as instructed 100 each 12  . hydrochlorothiazide  (MICROZIDE) 12.5 MG capsule Take 1 capsule (12.5 mg total) by mouth daily. 30 capsule 0  . insulin NPH-regular Human (NOVOLIN 70/30 RELION) (70-30) 100 UNIT/ML injection 45 u qam & 25 u qpm 10 mL 11  . levothyroxine (SYNTHROID, LEVOTHROID) 100 MCG tablet Take 1 tablet (100 mcg total) by mouth daily. 90 tablet 3  . losartan (COZAAR) 50 MG tablet Take 1 tablet (50 mg total) by mouth daily. 90 tablet 3  . meclizine (ANTIVERT) 12.5 MG tablet Take 1 tablet (12.5 mg total) by mouth 3 (three) times daily as needed for dizziness. 30 tablet 0  . metFORMIN (GLUCOPHAGE) 850 MG tablet Take 1 tablet (850 mg total) by mouth 2 (two) times daily with a meal. 180 tablet 3  . Misc Natural Products (LAXATIVE FORMULA) TABS Take 1 tablet by mouth as needed (for BM).    . ranitidine (ZANTAC) 150 MG capsule Take 1 capsule (150 mg total) by mouth 2 (two) times daily. 90 capsule 3  . TRUEPLUS LANCETS 33G MISC Use as directed 300 each 3   No current facility-administered medications on file prior to visit.     Allergies  Allergen Reactions  . Dilaudid [Hydromorphone Hcl] Nausea And Vomiting  . Lipitor [Atorvastatin] Other (See Comments)    Body & Muscle Aches  . Adhesive [Tape] Rash  . Ampicillin Rash    REACTION:  Rash and welts on her hands when she took it in the hospital  . Penicillins Rash   Social History   Socioeconomic History  . Marital status: Married    Spouse name: Not on file  . Number of children: 1  . Years of education: Not on file  . Highest education level: Not on file  Social Needs  . Financial resource strain: Not on file  . Food insecurity - worry: Not on file  . Food insecurity - inability: Not on file  . Transportation needs - medical: Not on file  . Transportation needs - non-medical: Not on file  Occupational History  . Occupation: Retired since 2003  Tobacco Use  . Smoking status: Never Smoker  . Smokeless tobacco: Never Used  Substance and Sexual Activity  . Alcohol use: No   . Drug use: No  . Sexual activity: Not on file  Other Topics Concern  . Not on file  Social History Narrative  . Not on file   Review of Systems  All other systems reviewed and are negative.    Review of Systems  All other systems reviewed and are negative.      Objective:   Physical Exam  Constitutional: She is oriented to person, place, and time. She appears well-developed and well-nourished. No distress.  Neck: Neck supple. No JVD present. No thyromegaly present.  Cardiovascular: Normal rate, regular rhythm and normal heart sounds. Exam reveals no gallop and no friction rub.  No murmur heard. Pulmonary/Chest: Effort normal and breath sounds normal. No respiratory distress. She has no wheezes. She has no rales. She exhibits no tenderness.  Abdominal: Soft. Bowel sounds are normal. She exhibits no distension and no mass. There is no tenderness. There is no rebound and no guarding.  Lymphadenopathy:    She has no cervical adenopathy.  Neurological: She is alert and oriented to person, place, and time.  Skin: She is not diaphoretic.  Vitals reviewed.         Assessment & Plan:  Type 2 diabetes mellitus with diabetic polyneuropathy, with long-term current use of insulin ([place])  Essential hypertension  Patient is instructed to continue hydrochlorothiazide 12.5 mg a day.  However she is instructed to resume losartan 50 mg a day.  I would like to recheck her blood pressure next week.  I have recommended that she take her insulin as prescribed 45 units in the morning and 25 unit.  I want her to check her blood sugar before every meal breakfast, lunch, dinner, and record the values that I can see the sugar readings in 1 week when.  For random sugars are not sufficient to make safe accurate adjustments in her insulin dose.  I also explained her the dangers and risk of hypoglycemia if she does not eat regularly.  I encouraged the patient to eat 3 regular meals per day to be compliant  with her insulin.  Over the last few visits, the patient seems increasingly more confused.  I am concerned that she may be showing signs of possible dementia.  I will perform a Mini-Mental status exam at her next visit.

## 2017-05-23 ENCOUNTER — Other Ambulatory Visit (INDEPENDENT_AMBULATORY_CARE_PROVIDER_SITE_OTHER): Payer: Self-pay | Admitting: Specialist

## 2017-05-23 ENCOUNTER — Telehealth (INDEPENDENT_AMBULATORY_CARE_PROVIDER_SITE_OTHER): Payer: Self-pay | Admitting: Specialist

## 2017-05-23 DIAGNOSIS — M4726 Other spondylosis with radiculopathy, lumbar region: Secondary | ICD-10-CM

## 2017-05-23 NOTE — Telephone Encounter (Signed)
[facility]a from the [facility] called stating that the code for the bone density test was incorrect and was needing to be fixed and resent over. CB # [id number] [phone]: [id number]

## 2017-05-23 NOTE — Telephone Encounter (Signed)
Order changed and [facility] has made aware

## 2017-05-23 NOTE — Telephone Encounter (Signed)
I don't see an order for a Bone density for this patient, can you look behind me and check the code if there needs to be a correction

## 2017-05-25 ENCOUNTER — Encounter: Payer: Self-pay | Admitting: Family Medicine

## 2017-05-25 ENCOUNTER — Ambulatory Visit (INDEPENDENT_AMBULATORY_CARE_PROVIDER_SITE_OTHER): Payer: Medicare HMO | Admitting: Family Medicine

## 2017-05-25 VITALS — BP 162/80 | HR 58 | Temp 97.8°F | Resp 18 | Ht 63.0 in | Wt 205.0 lb

## 2017-05-25 DIAGNOSIS — R413 Other amnesia: Secondary | ICD-10-CM | POA: Diagnosis not present

## 2017-05-25 DIAGNOSIS — Z794 Long term (current) use of insulin: Secondary | ICD-10-CM | POA: Diagnosis not present

## 2017-05-25 DIAGNOSIS — I10 Essential (primary) hypertension: Secondary | ICD-10-CM

## 2017-05-25 DIAGNOSIS — E1142 Type 2 diabetes mellitus with diabetic polyneuropathy: Secondary | ICD-10-CM | POA: Diagnosis not present

## 2017-05-25 NOTE — Progress Notes (Signed)
Subjective:    Patient ID: [name], female    DOB: [birth year], 67 y.o.   MRN: [id number]  HPI  05/18/17 Patient called 1/29 and stated she could no longer afford toujeo 80 units a day and wanted to switch to generic 70/30.  I recommended 45 units in the am and 25 units in the pm.  Here today to titrate insulin dose.  After her last visit, I asked the patient to resume hydrochlorothiazide which she has stopped.  She did resume the hydrochlorothiazide but then discontinued losartan for reasons I am uncertain of.  As a result her blood pressure is elevated today.  She denies any chest pain shortness of breath or dyspnea on exertion.  She is taking her insulin as prescribed however she skips meals frequently.  She is only checked for sugar values.  Each of these readings were in the morning before she ate breakfast.  They are as follows: 260, 210, 170, 160.  At that time, my plan was:  Patient is instructed to continue hydrochlorothiazide 12.5 mg a day.  However she is instructed to resume losartan 50 mg a day.  I would like to recheck her blood pressure next week.  I have recommended that she take her insulin as prescribed 45 units in the morning and 25 unit.  I want her to check her blood sugar before every meal breakfast, lunch, dinner, and record the values that I can see the sugar readings in 1 week when.  For random sugars are not sufficient to make safe accurate adjustments in her insulin dose.  I also explained her the dangers and risk of hypoglycemia if she does not eat regularly.  I encouraged the patient to eat 3 regular meals per day to be compliant with her insulin.  Over the last few visits, the patient seems increasingly more confused.  I am concerned that she may be showing signs of possible dementia.  I will perform a Mini-Mental status exam at her next visit.  05/25/17 Patient presents today with sugar readings just as I have asked her to do.  Her blood sugars in the morning are  averaging between 140 and 170.  Her blood sugars at lunchtime are averaging between 170 and 200.  Her blood sugars in the evening are 200-250.  She is taking 45 units in the morning and 25 units in the evening after dinner.  Her blood pressure today is no better than it was last time.  Patient has still not started taking her blood pressure medication.  She states that she forgets to take the pill.  I performed a Mini-Mental status exam today which she scored 30 out of 30 on.  I was quite surprised by her performance on the Mini-Mental status exam as I was concerned that she may have some underlying dementia.  I do still feel that she has some underlying memory problems.  At the present time, she is administering her own medication.  Occasionally her husband will ask her if she is taken it but he is not directly supervising the medication.  Current Outpatient Medications on File Prior to Visit  Medication Sig Dispense Refill  . acetaminophen (TYLENOL) 500 MG tablet Take 500 mg by mouth every 4 (four) hours as needed for mild pain or headache.    . Alcohol Swabs (B-D SINGLE USE SWABS REGULAR) PADS Use with insulin injections 300 each 3  . aspirin 81 MG tablet Take 81 mg by mouth daily.    [name] Kitchen  Blood Glucose Calibration (TRUE METRIX LEVEL 2) Normal SOLN Use as directed 3 each 3  . Blood Glucose Monitoring Suppl (TRUE METRIX AIR GLUCOSE METER) w/Device KIT 1 kit by Does not apply route 3 (three) times daily. 1 kit 0  . Calcium Carb-Cholecalciferol (CALTRATE 600+D3 SOFT) 600-800 MG-UNIT CHEW Chew 1 tablet by mouth 2 (two) times daily after a meal. 60 tablet 6  . fluticasone (FLONASE) 50 MCG/ACT nasal spray Place 2 sprays into both nostrils daily. 48 g 3  . gabapentin (NEURONTIN) 300 MG capsule Take 1 capsule (300 mg total) by mouth 3 (three) times daily. 90 capsule 3  . glucose blood (TRUE METRIX BLOOD GLUCOSE TEST) test strip Use as instructed 100 each 12  . hydrochlorothiazide (MICROZIDE) 12.5 MG capsule  Take 1 capsule (12.5 mg total) by mouth daily. 30 capsule 0  . insulin NPH-regular Human (NOVOLIN 70/30 RELION) (70-30) 100 UNIT/ML injection 45 u qam & 25 u qpm 10 mL 11  . levothyroxine (SYNTHROID, LEVOTHROID) 100 MCG tablet Take 1 tablet (100 mcg total) by mouth daily. 90 tablet 3  . losartan (COZAAR) 50 MG tablet Take 1 tablet (50 mg total) by mouth daily. 90 tablet 3  . meclizine (ANTIVERT) 12.5 MG tablet Take 1 tablet (12.5 mg total) by mouth 3 (three) times daily as needed for dizziness. 30 tablet 0  . metFORMIN (GLUCOPHAGE) 850 MG tablet Take 1 tablet (850 mg total) by mouth 2 (two) times daily with a meal. 180 tablet 3  . Misc Natural Products (LAXATIVE FORMULA) TABS Take 1 tablet by mouth as needed (for BM).    . ranitidine (ZANTAC) 150 MG tablet Take 1 tablet (150 mg total) by mouth 2 (two) times daily. 180 tablet 3  . TRUEPLUS LANCETS 33G MISC Use as directed 300 each 3   No current facility-administered medications on file prior to visit.     Allergies  Allergen Reactions  . Dilaudid [Hydromorphone Hcl] Nausea And Vomiting  . Lipitor [Atorvastatin] Other (See Comments)    Body & Muscle Aches  . Adhesive [Tape] Rash  . Ampicillin Rash    REACTION: Rash and welts on her hands when she took it in the hospital  . Penicillins Rash   Social History   Socioeconomic History  . Marital status: Married    Spouse name: Not on file  . Number of children: 1  . Years of education: Not on file  . Highest education level: Not on file  Social Needs  . Financial resource strain: Not on file  . Food insecurity - worry: Not on file  . Food insecurity - inability: Not on file  . Transportation needs - medical: Not on file  . Transportation needs - non-medical: Not on file  Occupational History  . Occupation: Retired since 2003  Tobacco Use  . Smoking status: Never Smoker  . Smokeless tobacco: Never Used  Substance and Sexual Activity  . Alcohol use: No  . Drug use: No  . Sexual  activity: Not on file  Other Topics Concern  . Not on file  Social History Narrative  . Not on file   Review of Systems  All other systems reviewed and are negative.    Review of Systems  All other systems reviewed and are negative.      Objective:   Physical Exam  Constitutional: She is oriented to person, place, and time. She appears well-developed and well-nourished. No distress.  Neck: Neck supple. No JVD present. No thyromegaly present.  Cardiovascular: Normal rate, regular rhythm and normal heart sounds. Exam reveals no gallop and no friction rub.  No murmur heard. Pulmonary/Chest: Effort normal and breath sounds normal. No respiratory distress. She has no wheezes. She has no rales. She exhibits no tenderness.  Abdominal: Soft. Bowel sounds are normal. She exhibits no distension and no mass. There is no tenderness. There is no rebound and no guarding.  Lymphadenopathy:    She has no cervical adenopathy.  Neurological: She is alert and oriented to person, place, and time.  Skin: She is not diaphoretic.  Vitals reviewed.         Assessment & Plan:  Type 2 diabetes mellitus with diabetic polyneuropathy, with long-term current use of insulin ([facility])  Essential hypertension  Memory loss Spent 25 minutes today with the patient and her husband.  Despite her performance on the Mini-Mental status exam which she scored 30 out of 30 I feel that she has some mild short-term memory loss.  I suspect that given her medical history she is likely suffered some small strokes and may have some underlying vascular dementia.  Therefore I want to work hard to reduce her risk factors.  Increase her insulin to 55 units in the morning.  Increase the evening dose to 30 units before supper.  Recheck 3 times daily sugars in 1 week.  Also gave strategies to help her remember to take her medication for her blood pressure and also recommended strongly that her husband administer the medicine to her or  at least supervise her take her medication because of the memory problems

## 2017-05-28 ENCOUNTER — Ambulatory Visit
Admission: RE | Admit: 2017-05-28 | Discharge: 2017-05-28 | Disposition: A | Discharge status: Home / Self Care | Payer: Medicare HMO | Source: Ambulatory Visit | Attending: Specialist | Admitting: Specialist

## 2017-05-28 DIAGNOSIS — M4807 Spinal stenosis, lumbosacral region: Secondary | ICD-10-CM

## 2017-05-28 DIAGNOSIS — M5114 Intervertebral disc disorders with radiculopathy, thoracic region: Secondary | ICD-10-CM | POA: Diagnosis not present

## 2017-05-28 DIAGNOSIS — M5115 Intervertebral disc disorders with radiculopathy, thoracolumbar region: Secondary | ICD-10-CM | POA: Diagnosis not present

## 2017-05-28 DIAGNOSIS — M5134 Other intervertebral disc degeneration, thoracic region: Secondary | ICD-10-CM

## 2017-05-28 MED ORDER — GADOBENATE DIMEGLUMINE 529 MG/ML IV SOLN
10.0000 mL | Freq: Once | INTRAVENOUS | Status: AC | PRN
  Administered 2017-05-28: 10 mL via INTRAVENOUS

## 2017-05-31 ENCOUNTER — Telehealth (INDEPENDENT_AMBULATORY_CARE_PROVIDER_SITE_OTHER): Payer: Self-pay | Admitting: Specialist

## 2017-05-31 NOTE — Telephone Encounter (Signed)
Dr.Sanjeev Deveshwar office called and he cant acces through pedicle. Please call office to discuss pt care @ [phone]

## 2017-05-31 NOTE — Telephone Encounter (Signed)
[name] office called and he cant acces through pedicle. Please call office to discuss pt care.

## 2017-06-01 ENCOUNTER — Encounter: Payer: Self-pay | Admitting: Family Medicine

## 2017-06-01 ENCOUNTER — Ambulatory Visit (INDEPENDENT_AMBULATORY_CARE_PROVIDER_SITE_OTHER): Payer: Medicare HMO | Admitting: Family Medicine

## 2017-06-01 VITALS — BP 126/78 | HR 65 | Temp 97.5°F | Resp 18 | Ht 65.0 in | Wt 208.0 lb

## 2017-06-01 DIAGNOSIS — Z794 Long term (current) use of insulin: Secondary | ICD-10-CM

## 2017-06-01 DIAGNOSIS — E1142 Type 2 diabetes mellitus with diabetic polyneuropathy: Secondary | ICD-10-CM

## 2017-06-01 DIAGNOSIS — I10 Essential (primary) hypertension: Secondary | ICD-10-CM | POA: Diagnosis not present

## 2017-06-01 NOTE — Progress Notes (Signed)
Subjective:    Patient ID: [name], female    DOB: 1950/09/26, 67 y.o.   MRN: [id number]  HPI  05/18/17 Patient called 1/29 and stated she could no longer afford toujeo 80 units a day and wanted to switch to generic 70/30.  I recommended 45 units in the am and 25 units in the pm.  Here today to titrate insulin dose.  After her last visit, I asked the patient to resume hydrochlorothiazide which she has stopped.  She did resume the hydrochlorothiazide but then discontinued losartan for reasons I am uncertain of.  As a result her blood pressure is elevated today.  She denies any chest pain shortness of breath or dyspnea on exertion.  She is taking her insulin as prescribed however she skips meals frequently.  She is only checked for sugar values.  Each of these readings were in the morning before she ate breakfast.  They are as follows: 260, 210, 170, 160.  At that time, my plan was:  Patient is instructed to continue hydrochlorothiazide 12.5 mg a day.  However she is instructed to resume losartan 50 mg a day.  I would like to recheck her blood pressure next week.  I have recommended that she take her insulin as prescribed 45 units in the morning and 25 unit.  I want her to check her blood sugar before every meal breakfast, lunch, dinner, and record the values that I can see the sugar readings in 1 week when.  For random sugars are not sufficient to make safe accurate adjustments in her insulin dose.  I also explained her the dangers and risk of hypoglycemia if she does not eat regularly.  I encouraged the patient to eat 3 regular meals per day to be compliant with her insulin.  Over the last few visits, the patient seems increasingly more confused.  I am concerned that she may be showing signs of possible dementia.  I will perform a Mini-Mental status exam at her next visit.  05/25/17 Patient presents today with sugar readings just as I have asked her to do.  Her blood sugars in the morning are  averaging between 140 and 170.  Her blood sugars at lunchtime are averaging between 170 and 200.  Her blood sugars in the evening are 200-250.  She is taking 45 units in the morning and 25 units in the evening after dinner.  Her blood pressure today is no better than it was last time.  Patient has still not started taking her blood pressure medication.  She states that she forgets to take the pill.  I performed a Mini-Mental status exam today which she scored 30 out of 30 on.  I was quite surprised by her performance on the Mini-Mental status exam as I was concerned that she may have some underlying dementia.  I do still feel that she has some underlying memory problems.  At the present time, she is administering her own medication.  Occasionally her husband will ask her if she is taken it but he is not directly supervising the medication.  At that time, my plan was: Spent 25 minutes today with the patient and her husband.  Despite her performance on the Mini-Mental status exam which she scored 30 out of 30 I feel that she has some mild short-term memory loss.  I suspect that given her medical history she is likely suffered some small strokes and may have some underlying vascular dementia.  Therefore I want to  work hard to reduce her risk factors.  Increase her insulin to 55 units in the morning.  Increase the evening dose to 30 units before supper.  Recheck 3 times daily sugars in 1 week.  Also gave strategies to help her remember to take her medication for her blood pressure and also recommended strongly that her husband administer the medicine to her or at least supervise her take her medication because of the memory problems    06/01/17 Blood pressure is much better now that she is taking both of her medications.  Blood pressure today is 126/78.  I continue to encourage compliance.  She is currently on 55 units of 70/30 in the morning and 30 units of 70/30 in the afternoon.  Fasting blood sugars are 130-100  and.  Lunchtime sugars are 200-250.  Dinner sugars are 200-250.  There is no episodes of hypoglycemia Current Outpatient Medications on File Prior to Visit  Medication Sig Dispense Refill  . acetaminophen (TYLENOL) 500 MG tablet Take 500 mg by mouth every 4 (four) hours as needed for mild pain or headache.    . Alcohol Swabs (B-D SINGLE USE SWABS REGULAR) PADS Use with insulin injections 300 each 3  . aspirin 81 MG tablet Take 81 mg by mouth daily.    . Blood Glucose Calibration (TRUE METRIX LEVEL 2) Normal SOLN Use as directed 3 each 3  . Blood Glucose Monitoring Suppl (TRUE METRIX AIR GLUCOSE METER) w/Device KIT 1 kit by Does not apply route 3 (three) times daily. 1 kit 0  . Calcium Carb-Cholecalciferol (CALTRATE 600+D3 SOFT) 600-800 MG-UNIT CHEW Chew 1 tablet by mouth 2 (two) times daily after a meal. 60 tablet 6  . fluticasone (FLONASE) 50 MCG/ACT nasal spray Place 2 sprays into both nostrils daily. 48 g 3  . gabapentin (NEURONTIN) 300 MG capsule Take 1 capsule (300 mg total) by mouth 3 (three) times daily. 90 capsule 3  . glucose blood (TRUE METRIX BLOOD GLUCOSE TEST) test strip Use as instructed 100 each 12  . hydrochlorothiazide (MICROZIDE) 12.5 MG capsule Take 1 capsule (12.5 mg total) by mouth daily. 30 capsule 0  . insulin NPH-regular Human (NOVOLIN 70/30 RELION) (70-30) 100 UNIT/ML injection 45 u qam & 25 u qpm 10 mL 11  . levothyroxine (SYNTHROID, LEVOTHROID) 100 MCG tablet Take 1 tablet (100 mcg total) by mouth daily. 90 tablet 3  . losartan (COZAAR) 50 MG tablet Take 1 tablet (50 mg total) by mouth daily. 90 tablet 3  . meclizine (ANTIVERT) 12.5 MG tablet Take 1 tablet (12.5 mg total) by mouth 3 (three) times daily as needed for dizziness. 30 tablet 0  . metFORMIN (GLUCOPHAGE) 850 MG tablet Take 1 tablet (850 mg total) by mouth 2 (two) times daily with a meal. 180 tablet 3  . Misc Natural Products (LAXATIVE FORMULA) TABS Take 1 tablet by mouth as needed (for BM).    . ranitidine  (ZANTAC) 150 MG tablet Take 1 tablet (150 mg total) by mouth 2 (two) times daily. 180 tablet 3  . TRUEPLUS LANCETS 33G MISC Use as directed 300 each 3   No current facility-administered medications on file prior to visit.     Allergies  Allergen Reactions  . Dilaudid [Hydromorphone Hcl] Nausea And Vomiting  . Lipitor [Atorvastatin] Other (See Comments)    Body & Muscle Aches  . Adhesive [Tape] Rash  . Ampicillin Rash    REACTION: Rash and welts on her hands when she took it in the hospital  .  Penicillins Rash   Social History   Socioeconomic History  . Marital status: Married    Spouse name: Not on file  . Number of children: 1  . Years of education: Not on file  . Highest education level: Not on file  Social Needs  . Financial resource strain: Not on file  . Food insecurity - worry: Not on file  . Food insecurity - inability: Not on file  . Transportation needs - medical: Not on file  . Transportation needs - non-medical: Not on file  Occupational History  . Occupation: Retired since 2003  Tobacco Use  . Smoking status: Never Smoker  . Smokeless tobacco: Never Used  Substance and Sexual Activity  . Alcohol use: No  . Drug use: No  . Sexual activity: Not on file  Other Topics Concern  . Not on file  Social History Narrative  . Not on file   Review of Systems  All other systems reviewed and are negative.    Review of Systems  All other systems reviewed and are negative.      Objective:   Physical Exam  Constitutional: She is oriented to person, place, and time. She appears well-developed and well-nourished. No distress.  Neck: Neck supple. No JVD present. No thyromegaly present.  Cardiovascular: Normal rate, regular rhythm and normal heart sounds. Exam reveals no gallop and no friction rub.  No murmur heard. Pulmonary/Chest: Effort normal and breath sounds normal. No respiratory distress. She has no wheezes. She has no rales. She exhibits no tenderness.    Abdominal: Soft. Bowel sounds are normal. She exhibits no distension and no mass. There is no tenderness. There is no rebound and no guarding.  Lymphadenopathy:    She has no cervical adenopathy.  Neurological: She is alert and oriented to person, place, and time.  Skin: She is not diaphoretic.  Vitals reviewed.         Assessment & Plan:  Increase insulin 70/30-65 units in the morning to better manage her lunch and suppertime sugars.  Continue 30 units in the evening to avoid nocturnal hypoglycemia.  Recheck sugars in 1 week to make further titrations in insulin dosage.  Blood pressure is now well controlled.  No further change in medication.  Continue to encourage compliance

## 2017-06-06 ENCOUNTER — Telehealth (INDEPENDENT_AMBULATORY_CARE_PROVIDER_SITE_OTHER): Payer: Self-pay | Admitting: Specialist

## 2017-06-06 NOTE — Telephone Encounter (Signed)
Patient called inquiring about the bone density test she is supposed to be having, she has a follow up with Dr. Louanne Skye next week and needs to know whats going on and if she needs to come in for her appt or reschedule. Please advise # [phone]

## 2017-06-07 ENCOUNTER — Other Ambulatory Visit (INDEPENDENT_AMBULATORY_CARE_PROVIDER_SITE_OTHER): Payer: Self-pay | Admitting: Specialist

## 2017-06-07 ENCOUNTER — Ambulatory Visit (INDEPENDENT_AMBULATORY_CARE_PROVIDER_SITE_OTHER): Payer: Medicare HMO | Admitting: Specialist

## 2017-06-07 DIAGNOSIS — S32018A Other fracture of first lumbar vertebra, initial encounter for closed fracture: Secondary | ICD-10-CM

## 2017-06-07 NOTE — Telephone Encounter (Signed)
Were you able to get patient scheduled for Bone Density test?

## 2017-06-09 NOTE — Telephone Encounter (Signed)
[facility]uled with [facility] on 06/13/17

## 2017-06-09 NOTE — Telephone Encounter (Signed)
Do you think the results will be in by her appt with Dr. Louanne Skye that same day @ 1015?

## 2017-06-12 ENCOUNTER — Other Ambulatory Visit (INDEPENDENT_AMBULATORY_CARE_PROVIDER_SITE_OTHER): Payer: Self-pay | Admitting: Specialist

## 2017-06-12 ENCOUNTER — Telehealth (INDEPENDENT_AMBULATORY_CARE_PROVIDER_SITE_OTHER): Payer: Self-pay | Admitting: Specialist

## 2017-06-12 ENCOUNTER — Telehealth (INDEPENDENT_AMBULATORY_CARE_PROVIDER_SITE_OTHER): Payer: Self-pay

## 2017-06-12 ENCOUNTER — Other Ambulatory Visit: Payer: Self-pay

## 2017-06-12 DIAGNOSIS — S32018A Other fracture of first lumbar vertebra, initial encounter for closed fracture: Secondary | ICD-10-CM

## 2017-06-12 NOTE — Telephone Encounter (Signed)
Patient called stating that she can not have her bone density in the morning because she was told by them that they need a referral for her to have the examination.  CB#[phone].  Thank you.

## 2017-06-12 NOTE — Telephone Encounter (Signed)
Cherish with the [facility] called stating that they sent a request through Epic for Dr. Louanne Skye to sign a referral for patient.  Patient has appt. 06/13/17 @8 :30am.  Cb# is [phone].  Please advise.  Thank You.

## 2017-06-12 NOTE — Telephone Encounter (Signed)
They marked order as stat and will try to get scan read before appt

## 2017-06-12 NOTE — Telephone Encounter (Signed)
I called and advised [place] that Dr. Louanne Skye will sign off on order as soon as he completes clinic this evening

## 2017-06-12 NOTE — Telephone Encounter (Signed)
I called and advised patient that we have the request from [facility] and that Dr. Louanne Skye will sign it off after he sees patients so it will be there in time for her appt

## 2017-06-12 NOTE — Telephone Encounter (Signed)
Her appt for Bone Density is scheduled at 900am, I dont believe it will be ready to read before her appt. sorry

## 2017-06-12 NOTE — Telephone Encounter (Signed)
Then the procedure is not going to be of benefit. Will need to discuss with her at next office visit.

## 2017-06-13 ENCOUNTER — Ambulatory Visit
Admission: RE | Admit: 2017-06-13 | Discharge: 2017-06-13 | Disposition: A | Discharge status: Home / Self Care | Payer: Medicare HMO | Source: Ambulatory Visit | Attending: Specialist | Admitting: Specialist

## 2017-06-13 ENCOUNTER — Ambulatory Visit (INDEPENDENT_AMBULATORY_CARE_PROVIDER_SITE_OTHER): Payer: Medicare HMO | Admitting: Specialist

## 2017-06-13 ENCOUNTER — Encounter (INDEPENDENT_AMBULATORY_CARE_PROVIDER_SITE_OTHER): Payer: Self-pay | Admitting: Specialist

## 2017-06-13 VITALS — BP 162/73 | HR 70 | Ht 64.0 in | Wt 203.0 lb

## 2017-06-13 DIAGNOSIS — M5126 Other intervertebral disc displacement, lumbar region: Secondary | ICD-10-CM

## 2017-06-13 DIAGNOSIS — S32000A Wedge compression fracture of unspecified lumbar vertebra, initial encounter for closed fracture: Secondary | ICD-10-CM

## 2017-06-13 DIAGNOSIS — M4326 Fusion of spine, lumbar region: Secondary | ICD-10-CM | POA: Diagnosis not present

## 2017-06-13 DIAGNOSIS — M5136 Other intervertebral disc degeneration, lumbar region: Secondary | ICD-10-CM | POA: Diagnosis not present

## 2017-06-13 DIAGNOSIS — M5116 Intervertebral disc disorders with radiculopathy, lumbar region: Secondary | ICD-10-CM

## 2017-06-13 DIAGNOSIS — M85851 Other specified disorders of bone density and structure, right thigh: Secondary | ICD-10-CM | POA: Diagnosis not present

## 2017-06-13 DIAGNOSIS — M810 Age-related osteoporosis without current pathological fracture: Secondary | ICD-10-CM

## 2017-06-13 DIAGNOSIS — S32018A Other fracture of first lumbar vertebra, initial encounter for closed fracture: Secondary | ICD-10-CM

## 2017-06-13 DIAGNOSIS — Z780 Asymptomatic menopausal state: Secondary | ICD-10-CM

## 2017-06-13 DIAGNOSIS — M8580 Other specified disorders of bone density and structure, unspecified site: Secondary | ICD-10-CM

## 2017-06-13 MED ORDER — HYDROCODONE-ACETAMINOPHEN 5-325 MG PO TABS: 1.0000 | ORAL_TABLET | Freq: Four times a day (QID) | ORAL | 0 refills | Status: DC | PRN

## 2017-06-13 NOTE — Telephone Encounter (Signed)
appt today @ 1015

## 2017-06-13 NOTE — Patient Instructions (Addendum)
Avoid frequent bending and stooping  No lifting greater than 10 lbs. May use ice or moist heat for pain. Weight loss is of benefit. Handicap license is approved.  Referral to Dr. [name] for pain associated with disc herniation L1-2, T12-L1 above L2 to S1 Referral to Endocrinology, Dr. Elayne Snare to assist in treatment of osteopenia to  decrease risk of  Hardware failure or adjacent level bone failure, compression fractures with extension of the fusion from L2-S1 to T10 to S1.

## 2017-06-13 NOTE — Progress Notes (Signed)
Office Visit Note   [name]           Date of Birth: 10/22/1950           MRN: [id number] Visit Date: 06/13/2017              Requested by: Susy Frizzle, MD [address] [place], [place] [address] PCP: Susy Frizzle, MD   Assessment & Plan: Visit Diagnoses:  1. Herniation of nucleus pulposus of lumbar intervertebral disc with sciatica   2. Degenerative disc disease, lumbar   3. Fusion of spine of lumbar region   4. Closed compression fracture of body of lumbar vertebra (HCC)     Plan: Avoid frequent bending and stooping  No lifting greater than 10 lbs. May use ice or moist heat for pain. Weight loss is of benefit. Handicap license is approved.  Referral to Dr. [name] for pain associated with disc herniation L1-2, T12-L1 above L2 to S1 Referral to Endocrinology, Dr. Elayne Snare to assist in treatment of osteopenia to  decrease risk of  Hardware failure or adjacent level bone failure, compression fractures with extension of the fusion from L2-S1 to T10 to S1. Follow-Up Instructions: No Follow-up on file.   Orders:  No orders of the defined types were placed in this encounter.  No orders of the defined types were placed in this encounter.     Procedures: No procedures performed   Clinical Data: No additional findings.   Subjective: Chief Complaint  Patient presents with  . Lower Back - Follow-up    HPI  Review of Systems   Objective: Vital Signs: BP (!) 162/73 (BP Location: Left Arm, Patient Position: Sitting)   Pulse 70   Ht 5\' 4"  (1.626 m)   Wt 203 lb (92.1 kg)   BMI 34.84 kg/m   Physical Exam  Ortho Exam  Specialty Comments:  No specialty comments available.  Imaging: Dg Bone Density  Result Date: 06/13/2017 EXAM: DUAL X-RAY ABSORPTIOMETRY (DXA) FOR BONE MINERAL DENSITY IMPRESSION: Referring Physician:  Jessy Oto PATIENT: Name: Desiree, Pratt Patient ID: [id number] Birth Date: 12-Dec-1950 Height:      64.0 in. Sex: Female Measured: 06/13/2017 Weight:     203.2 lbs. Indications: Bilateral Ovariectomy (65.51), Breast Cancer History, Estrogen Deficient, Gabapentin, Hysterectomy, Insulin for Diabetes, Levothyroxine, osteoarthiritis, Postmenopausal Fractures:  None Treatments: Calcium (E943.0), Vitamin D (E933.5) ASSESSMENT: The BMD measured at Femur Total Right is 0.783 g/cm2 with a T-score of -1.8. This patient is considered osteopenic according to [facility]) criteria. Lumbar spine was not utilized due to surgical hardware. Site Region Measured Date Measured Age YA BMD Significant CHANGE T-score DualFemur Total Right 06/13/2017 68.7 -1.6 0.783 g/cm2 Left Forearm Radius 33% 06/13/2017 68.7 -0.9 0.804 g/cm2 World Health Organization [facility]) criteria for post-menopausal, Caucasian Women: Normal       T-score at or above -1 SD Osteopenia   T-score between -1 and -2.5 SD Osteoporosis T-score at or below -2.5 SD RECOMMENDATION: [place] recommends that FDA-approved medical therapies be considered in postmenopausal women and men age 73 or older with a: 1. Hip or vertebral (clinical or morphometric) fracture. 2. T-score of less than or equal to -2.5 at the spine or hip. 3. Ten-year fracture probability by FRAX of 3% or greater for hip fracture or 20% or greater for major osteoporotic fracture. All treatment decisions require clinical judgment and consideration of individual patient factors, including patient preferences, co-morbidities, previous drug use,  risk factors not captured in the FRAX model (e.g. falls, vitamin D deficiency, increased bone turnover, interval significant decline in bone density) and possible under- or over-estimation of fracture risk by FRAX. All patients should ensure an adequate intake of dietary calcium (1200 mg/d) and vitamin D (800 IU daily) unless contraindicated. FOLLOW-UP: People with diagnosed cases of osteoporosis or at high risk for fracture  should have regular bone mineral density tests. For patients eligible for Medicare, routine testing is allowed once every 2 years. The testing frequency can be increased to one year for patients who have rapidly progressing disease, those who are receiving or discontinuing medical therapy to restore bone mass, or have additional risk factors. I have reviewed this report and agree with the above findings. [facility] FRAX* 10-year Probability of Fracture Based on femoral neck BMD: DualFemur (Right) Major Osteoporotic Fracture: 4.4% Hip Fracture:                0.7% Population:                  [place] ([place]) Risk Factors:                None *FRAX is a Materials engineer of the [employer] of [employer] for Metabolic Bone Disease, a World Pharmacologist (WHO) [facility]. ASSESSMENT: The probability of a major osteoporotic fracture is 4.4 % within the next ten years. The probability of hip fracture is  0.7  % within the next 10 years. Electronically Signed   By: Lovey Newcomer M.D.   On: 06/13/2017 09:28     PMFS History: Patient Active Problem List   Diagnosis Date Noted  . HNP (herniated nucleus pulposus), lumbar 08/22/2014    Priority: High    Class: Chronic  . Herniated nucleus pulposus, L2-3 right 02/04/2014    Priority: High    Class: Acute  . Lumbar stenosis with neurogenic claudication 04/30/2012    Priority: High    Class: Chronic  . Type II diabetes mellitus ([place])   . Sciatica   . PONV (postoperative nausea and vomiting)   . Peripheral neuropathy   . Osteoarthritis   . Migraines   . Irritable bowel syndrome   . Hypertension   . History of right mastectomy   . History of kidney stones   . H/O hiatal hernia   . GERD (gastroesophageal reflux disease)   . Family history of anesthesia complication   . Chronic lower back pain   . Breast cancer ([place])   . Arthritis   . Diabetes mellitus type 2, uncontrolled, with complications ([place])   . Lower extremity  edema   . Positive D dimer 03/05/2017  . Elevated d-dimer   . Elevated troponin   . Chronic constipation 06/23/2015  . Obesity 06/12/2015  . Chronic diastolic heart failure ([place]) 11/17/2014  . Urinary retention 05/04/2012    Class: Temporary  . Spondylolisthesis of lumbar region 04/30/2012    Class: Chronic  . Chest pain 03/20/2012  . Anginal pain ([place]) 03/19/2012  . Right shoulder pain 12/07/2010  . ABDOMINAL PAIN, LOWER 09/21/2009  . BACK PAIN 03/18/2009  . SYNCOPE-CAROTID SINUS 09/16/2008  . Essential hypertension 08/05/2008  . Hypothyroidism 08/23/2006  . Diabetes with neurologic complications ([place]) 40/98/1191  . Diabetic neuropathy ([place]) 08/23/2006  . CARDIOMYOPATHY 08/23/2006  . GERD 08/23/2006  . OSTEOARTHRITIS 08/23/2006  . DDD (degenerative disc disease), cervical 08/23/2006  . [place] DISEASE, LUMBAR 08/23/2006  . Headache(784.0) 08/23/2006  . BREAST CANCER, HX  OF 08/23/2006  . HYPERPARATHYROIDISM, HX OF 08/23/2006  . RENAL CALCULUS, HX OF 08/23/2006   Past Medical History:  Diagnosis Date  . Anginal pain ([place]) 03/19/2012   saw Dr. Cathie Olden .. she thinks its more related to stomach issues  . Arthritis    "all over; mostly in my back" (03/20/2012)  . Breast cancer ([place])    "right" (03/20/2012)  . Cardiomyopathy   . Chest pain 06/2005   Hospitalized, dystolic dysfunction  . Chronic lower back pain    "have 2 herniated discs; going to have to have a fusion" (03/20/2012)  . Family history of anesthesia complication    "father has nausea and vomiting"  . GERD (gastroesophageal reflux disease)   . H/O hiatal hernia   . History of kidney stones   . History of right mastectomy   . Hypertension    "patient states never had HTN, takes Hyzaar for heart  . Hypothyroidism   . Irritable bowel syndrome   . Migraines    "it's been a long time since I've had one" (03/20/2012)  . Osteoarthritis   . Peripheral neuropathy   . PONV (postoperative nausea and vomiting)   .  Sciatica   . Type II diabetes mellitus (HCC)     Family History  Problem Relation Age of Onset  . Hypertension Mother   . Diabetes Father   . Hypertension Father   . Hypertension Sister   . Hypertension Brother   . Hypertension Brother   . Diabetes Brother   . Diabetes Other        Family Hx. of Diabetes  . Hypertension Other        Family History of HTN  . Deep vein thrombosis Daughter     Past Surgical History:  Procedure Laterality Date  . ABDOMINAL HYSTERECTOMY  1993  . adenosin cardiolite  07/2005   (+) wall motion abnormality  . AXILLARY LYMPH NODE DISSECTION  2003   squamous cell cancer  . BACK SURGERY    . CARDIAC CATHETERIZATION  ? 2005 / 2007  . CT abdomen and pelvis  02/2010   Same  . ESOPHAGOGASTRODUODENOSCOPY  2005   GERD  . KNEE ARTHROSCOPY  1990's   "right" (03/20/2012)  . LUMBAR FUSION N/A 08/22/2014   Procedure: Right L2-3 and right L1-2 transforaminal lumbar interbody fusion with pedicle screws, rods, sleeves, cages, local bone graft, Vivigen, cancellous chips;  Surgeon: Jessy Oto, MD;  Location: [facility];  Service: Orthopedics;  Laterality: N/A;  . LUMBAR LAMINECTOMY/DECOMPRESSION MICRODISCECTOMY N/A 02/04/2014   Procedure: RIGHT L2-3 MICRODISCECTOMY ;  Surgeon: Jessy Oto, MD;  Location: [facility];  Service: Orthopedics;  Laterality: N/A;  . MASTECTOMY WITH AXILLARY LYMPH NODE DISSECTION  12/2003   "right" (03/20/2012)  . POSTERIOR CERVICAL FUSION/FORAMINOTOMY  2001  . SHOULDER ARTHROSCOPY W/ ROTATOR CUFF REPAIR  2003   Left  . THYROIDECTOMY  1977   Right  . TOTAL KNEE ARTHROPLASTY  2000   Right  . [place] of abdomen  07/2005   Fatty liver / kidney stones   Social History   Occupational History  . Occupation: Retired since 2003  Tobacco Use  . Smoking status: Never Smoker  . Smokeless tobacco: Never Used  Substance and Sexual Activity  . Alcohol use: No  . Drug use: No  . Sexual activity: Not on file

## 2017-06-14 ENCOUNTER — Other Ambulatory Visit: Payer: Self-pay

## 2017-06-14 MED ORDER — GABAPENTIN 300 MG PO CAPS: 300.0000 mg | ORAL_CAPSULE | Freq: Three times a day (TID) | ORAL | 3 refills | Status: DC

## 2017-06-19 DIAGNOSIS — L080 Pyoderma: Secondary | ICD-10-CM | POA: Diagnosis not present

## 2017-06-20 ENCOUNTER — Ambulatory Visit (INDEPENDENT_AMBULATORY_CARE_PROVIDER_SITE_OTHER): Payer: Medicare HMO | Admitting: Family Medicine

## 2017-06-20 ENCOUNTER — Encounter: Payer: Self-pay | Admitting: Family Medicine

## 2017-06-20 VITALS — BP 130/60 | HR 78 | Temp 97.9°F | Resp 14 | Ht 65.0 in | Wt 205.0 lb

## 2017-06-20 DIAGNOSIS — L988 Other specified disorders of the skin and subcutaneous tissue: Secondary | ICD-10-CM

## 2017-06-20 DIAGNOSIS — N649 Disorder of breast, unspecified: Secondary | ICD-10-CM

## 2017-06-20 DIAGNOSIS — L98498 Non-pressure chronic ulcer of skin of other sites with other specified severity: Secondary | ICD-10-CM | POA: Diagnosis not present

## 2017-06-20 NOTE — Progress Notes (Signed)
Subjective:    Patient ID: [name], female    DOB: 08/03/1950, 67 y.o.   MRN: [id number]  HPI Patient has seen my partner in the past for this for possible cellulitis in the breast.  She was even referred to dermatology who is seen her for this condition in the past.  Patient states that she will develop blisters only on the right breast.  These blisters will then rupture forming a ulcer/crater with exposed granulation tissue and raised rolled edges.  They look extremely painful however the patient states that they cause very little pain.  There is no association with fever.  Currently there is a lesion on the fat/breast tissue in the axillary line on the right side.  It is elliptical in shape and approximately 1.5 cm x 4 cm.  It is approximately 1/2 cm deep.  She has seen a dermatologist who recommended mupirocin and dressing changes until healed however they have not provided her a diagnosis.  This is been recurrent for more than a year and the patient is unsure of the diagnosis. Past Medical History:  Diagnosis Date  . Anginal pain ([place]) 03/19/2012   saw Dr. Cathie Olden .. she thinks its more related to stomach issues  . Arthritis    "all over; mostly in my back" (03/20/2012)  . Breast cancer ([place])    "right" (03/20/2012)  . Cardiomyopathy   . Chest pain 06/2005   Hospitalized, dystolic dysfunction  . Chronic lower back pain    "have 2 herniated discs; going to have to have a fusion" (03/20/2012)  . Family history of anesthesia complication    "father has nausea and vomiting"  . GERD (gastroesophageal reflux disease)   . H/O hiatal hernia   . History of kidney stones   . History of right mastectomy   . Hypertension    "patient states never had HTN, takes Hyzaar for heart  . Hypothyroidism   . Irritable bowel syndrome   . Migraines    "it's been a long time since I've had one" (03/20/2012)  . Osteoarthritis   . Peripheral neuropathy   . PONV (postoperative nausea and  vomiting)   . Sciatica   . Type II diabetes mellitus ([place])    Past Surgical History:  Procedure Laterality Date  . ABDOMINAL HYSTERECTOMY  1993  . adenosin cardiolite  07/2005   (+) wall motion abnormality  . AXILLARY LYMPH NODE DISSECTION  2003   squamous cell cancer  . BACK SURGERY    . CARDIAC CATHETERIZATION  ? 2005 / 2007  . CT abdomen and pelvis  02/2010   Same  . ESOPHAGOGASTRODUODENOSCOPY  2005   GERD  . KNEE ARTHROSCOPY  1990's   "right" (03/20/2012)  . LUMBAR FUSION N/A 08/22/2014   Procedure: Right L2-3 and right L1-2 transforaminal lumbar interbody fusion with pedicle screws, rods, sleeves, cages, local bone graft, Vivigen, cancellous chips;  Surgeon: Jessy Oto, MD;  Location: [facility];  Service: Orthopedics;  Laterality: N/A;  . LUMBAR LAMINECTOMY/DECOMPRESSION MICRODISCECTOMY N/A 02/04/2014   Procedure: RIGHT L2-3 MICRODISCECTOMY ;  Surgeon: Jessy Oto, MD;  Location: [facility];  Service: Orthopedics;  Laterality: N/A;  . MASTECTOMY WITH AXILLARY LYMPH NODE DISSECTION  12/2003   "right" (03/20/2012)  . POSTERIOR CERVICAL FUSION/FORAMINOTOMY  2001  . SHOULDER ARTHROSCOPY W/ ROTATOR CUFF REPAIR  2003   Left  . THYROIDECTOMY  1977   Right  . TOTAL KNEE ARTHROPLASTY  2000   Right  . [place] of  abdomen  07/2005   Fatty liver / kidney stones   Current Outpatient Medications on File Prior to Visit  Medication Sig Dispense Refill  . acetaminophen (TYLENOL) 500 MG tablet Take 500 mg by mouth every 4 (four) hours as needed for mild pain or headache.    . Alcohol Swabs (B-D SINGLE USE SWABS REGULAR) PADS Use with insulin injections 300 each 3  . aspirin 81 MG tablet Take 81 mg by mouth daily.    . Blood Glucose Calibration (TRUE METRIX LEVEL 2) Normal SOLN Use as directed 3 each 3  . Blood Glucose Monitoring Suppl (TRUE METRIX AIR GLUCOSE METER) w/Device KIT 1 kit by Does not apply route 3 (three) times daily. 1 kit 0  . Calcium Carb-Cholecalciferol (CALTRATE 600+D3 SOFT)  600-800 MG-UNIT CHEW Chew 1 tablet by mouth 2 (two) times daily after a meal. 60 tablet 6  . fluticasone (FLONASE) 50 MCG/ACT nasal spray Place 2 sprays into both nostrils daily. 48 g 3  . gabapentin (NEURONTIN) 300 MG capsule Take 1 capsule (300 mg total) by mouth 3 (three) times daily. 90 capsule 3  . glucose blood (TRUE METRIX BLOOD GLUCOSE TEST) test strip Use as instructed 100 each 12  . hydrochlorothiazide (MICROZIDE) 12.5 MG capsule Take 1 capsule (12.5 mg total) by mouth daily. 30 capsule 0  . HYDROcodone-acetaminophen (NORCO/VICODIN) 5-325 MG tablet Take 1 tablet by mouth every 6 (six) hours as needed for moderate pain. 30 tablet 0  . insulin NPH-regular Human (NOVOLIN 70/30 RELION) (70-30) 100 UNIT/ML injection 45 u qam & 25 u qpm (Patient taking differently: 65 u qam & 30 u qpm) 10 mL 11  . levothyroxine (SYNTHROID, LEVOTHROID) 100 MCG tablet Take 1 tablet (100 mcg total) by mouth daily. 90 tablet 3  . losartan (COZAAR) 50 MG tablet Take 1 tablet (50 mg total) by mouth daily. 90 tablet 3  . meclizine (ANTIVERT) 12.5 MG tablet Take 1 tablet (12.5 mg total) by mouth 3 (three) times daily as needed for dizziness. 30 tablet 0  . metFORMIN (GLUCOPHAGE) 850 MG tablet Take 1 tablet (850 mg total) by mouth 2 (two) times daily with a meal. 180 tablet 3  . Misc Natural Products (LAXATIVE FORMULA) TABS Take 1 tablet by mouth as needed (for BM).    . ranitidine (ZANTAC) 150 MG tablet Take 1 tablet (150 mg total) by mouth 2 (two) times daily. 180 tablet 3  . TRUEPLUS LANCETS 33G MISC Use as directed 300 each 3   No current facility-administered medications on file prior to visit.    Allergies  Allergen Reactions  . Dilaudid [Hydromorphone Hcl] Nausea And Vomiting  . Lipitor [Atorvastatin] Other (See Comments)    Body & Muscle Aches  . Adhesive [Tape] Rash  . Ampicillin Rash    REACTION: Rash and welts on her hands when she took it in the hospital  . Penicillins Rash   Social History    Socioeconomic History  . Marital status: Married    Spouse name: Not on file  . Number of children: 1  . Years of education: Not on file  . Highest education level: Not on file  Social Needs  . Financial resource strain: Not on file  . Food insecurity - worry: Not on file  . Food insecurity - inability: Not on file  . Transportation needs - medical: Not on file  . Transportation needs - non-medical: Not on file  Occupational History  . Occupation: Retired since 2003  Tobacco Use  .  Smoking status: Never Smoker  . Smokeless tobacco: Never Used  Substance and Sexual Activity  . Alcohol use: No  . Drug use: No  . Sexual activity: Not on file  Other Topics Concern  . Not on file  Social History Narrative  . Not on file      Review of Systems  All other systems reviewed and are negative.      Objective:   Physical Exam  Constitutional: She appears well-developed and well-nourished.  Cardiovascular: Normal rate, regular rhythm and normal heart sounds.  Pulmonary/Chest: Effort normal and breath sounds normal.    Skin: Lesion noted. There is erythema.  Vitals reviewed.         Assessment & Plan:  Skin lesion of breast - Plan: Pathology  I am concerned that the patient continues to have recurrent lesions on the right breast.  Differential diagnosis includes sweet syndrome versus Paget's disease.  Pyoderma gangrenosum is also on the differential diagnosis.  Therefore I have recommended a punch biopsy.  The area was anesthetized with 0.1% lidocaine with epinephrine.  A 5 mm punch biopsy was performed using sterile technique.  The wound was closed with 1 simple interrupted 3-0 Ethilon suture.  Await the biopsy results to determine future treatment decisions.

## 2017-06-23 LAB — TISSUE SPECIMEN

## 2017-06-23 LAB — PATHOLOGY

## 2017-06-26 ENCOUNTER — Ambulatory Visit (INDEPENDENT_AMBULATORY_CARE_PROVIDER_SITE_OTHER): Payer: Medicare HMO | Admitting: Family Medicine

## 2017-06-26 ENCOUNTER — Ambulatory Visit: Payer: Medicare HMO | Admitting: Podiatry

## 2017-06-26 VITALS — BP 190/80 | HR 68 | Temp 98.1°F | Resp 16 | Ht 65.0 in | Wt 205.0 lb

## 2017-06-26 DIAGNOSIS — I96 Gangrene, not elsewhere classified: Secondary | ICD-10-CM | POA: Diagnosis not present

## 2017-06-26 MED ORDER — LOSARTAN POTASSIUM 50 MG PO TABS: 50.0000 mg | ORAL_TABLET | Freq: Every day | ORAL | 3 refills | Status: DC

## 2017-06-26 MED ORDER — HYDROCHLOROTHIAZIDE 12.5 MG PO CAPS: 12.5000 mg | ORAL_CAPSULE | Freq: Every day | ORAL | 0 refills | Status: DC

## 2017-06-27 ENCOUNTER — Ambulatory Visit: Payer: Medicare HMO | Admitting: Family Medicine

## 2017-06-27 NOTE — Progress Notes (Signed)
Subjective:    Patient ID: [name], female    DOB: June 23, 1950, 67 y.o.   MRN: [id number]  HPI  06/20/17 Patient has seen my partner in the past for this for possible cellulitis in the breast.  She was even referred to dermatology who is seen her for this condition in the past.  Patient states that she will develop blisters only on the right breast.  These blisters will then rupture forming a ulcer/crater with exposed granulation tissue and raised rolled edges.  They look extremely painful however the patient states that they cause very little pain.  There is no association with fever.  Currently there is a lesion on the fat/breast tissue in the axillary line on the right side.  It is elliptical in shape and approximately 1.5 cm x 4 cm.  It is approximately 1/2 cm deep.  She has seen a dermatologist who recommended mupirocin and dressing changes until healed however they have not provided her a diagnosis.  This is been recurrent for more than a year and the patient is unsure of the diagnosis.  At that time, my plan was: I am concerned that the patient continues to have recurrent lesions on the right breast.  Differential diagnosis includes sweet syndrome versus Paget's disease.  Pyoderma gangrenosum is also on the differential diagnosis.  Therefore I have recommended a punch biopsy.  The area was anesthetized with 0.1% lidocaine with epinephrine.  A 5 mm punch biopsy was performed using sterile technique.  The wound was closed with 1 simple interrupted 3-0 Ethilon suture.  Await the biopsy results to determine future treatment decisions.  06/27/17 Patient is here today for suture removal and to discuss the results of the biopsy.  Please see previous office visits this year.  Patient's blood pressure is well controlled when she takes her blood pressure medication.  Today her blood pressure is extremely high, 190/80.  However the patient admits that she has not taken any of her medication the last 3  days.  She states that she has been too busy.  Compliance has been an issue for this patient and continues to be an issue for this patient.  She denies any chest pain shortness of breath dyspnea on exertion headache blurry vision or change in urine output.  One suture is removed today without difficulty.  Punch biopsy revealed "extensive skin necrosis".  This raises the question of what can cause extensive skin necrosis in a recurrent fashion isolated to a previous surgical site.  Patient underwent a mastectomy in 2005.  However only in the last year has she developed these recurrent ulcerative, necrotic lesions on the skin overlying the right chest.  I cannot think of another cause of recurrent skin necrosis isolated to one particular body part unless that body part has compromised blood flow.  I am concerned that the skin flap has compromised blood flow causing these necrotic lesions.  Today on exam, the previous ulcer has extended.  It is now 2 cm x 6 cm.  It has a dry black eschar in the center.  There is no pain.  It is numb to the touch Past Medical History:  Diagnosis Date  . Anginal pain ([place]) 03/19/2012   saw Dr. Cathie Olden .. she thinks its more related to stomach issues  . Arthritis    "all over; mostly in my back" (03/20/2012)  . Breast cancer ([place])    "right" (03/20/2012)  . Cardiomyopathy   . Chest pain 06/2005  Hospitalized, dystolic dysfunction  . Chronic lower back pain    "have 2 herniated discs; going to have to have a fusion" (03/20/2012)  . Family history of anesthesia complication    "father has nausea and vomiting"  . GERD (gastroesophageal reflux disease)   . H/O hiatal hernia   . History of kidney stones   . History of right mastectomy   . Hypertension    "patient states never had HTN, takes Hyzaar for heart  . Hypothyroidism   . Irritable bowel syndrome   . Migraines    "it's been a long time since I've had one" (03/20/2012)  . Osteoarthritis   . Peripheral neuropathy    . PONV (postoperative nausea and vomiting)   . Sciatica   . Type II diabetes mellitus ([place])    Past Surgical History:  Procedure Laterality Date  . ABDOMINAL HYSTERECTOMY  1993  . adenosin cardiolite  07/2005   (+) wall motion abnormality  . AXILLARY LYMPH NODE DISSECTION  2003   squamous cell cancer  . BACK SURGERY    . CARDIAC CATHETERIZATION  ? 2005 / 2007  . CT abdomen and pelvis  02/2010   Same  . ESOPHAGOGASTRODUODENOSCOPY  2005   GERD  . KNEE ARTHROSCOPY  1990's   "right" (03/20/2012)  . LUMBAR FUSION N/A 08/22/2014   Procedure: Right L2-3 and right L1-2 transforaminal lumbar interbody fusion with pedicle screws, rods, sleeves, cages, local bone graft, Vivigen, cancellous chips;  Surgeon: Jessy Oto, MD;  Location: [facility];  Service: Orthopedics;  Laterality: N/A;  . LUMBAR LAMINECTOMY/DECOMPRESSION MICRODISCECTOMY N/A 02/04/2014   Procedure: RIGHT L2-3 MICRODISCECTOMY ;  Surgeon: Jessy Oto, MD;  Location: [facility];  Service: Orthopedics;  Laterality: N/A;  . MASTECTOMY WITH AXILLARY LYMPH NODE DISSECTION  12/2003   "right" (03/20/2012)  . POSTERIOR CERVICAL FUSION/FORAMINOTOMY  2001  . SHOULDER ARTHROSCOPY W/ ROTATOR CUFF REPAIR  2003   Left  . THYROIDECTOMY  1977   Right  . TOTAL KNEE ARTHROPLASTY  2000   Right  . [place] of abdomen  07/2005   Fatty liver / kidney stones   Current Outpatient Medications on File Prior to Visit  Medication Sig Dispense Refill  . acetaminophen (TYLENOL) 500 MG tablet Take 500 mg by mouth every 4 (four) hours as needed for mild pain or headache.    . Alcohol Swabs (B-D SINGLE USE SWABS REGULAR) PADS Use with insulin injections 300 each 3  . aspirin 81 MG tablet Take 81 mg by mouth daily.    . Blood Glucose Calibration (TRUE METRIX LEVEL 2) Normal SOLN Use as directed 3 each 3  . Blood Glucose Monitoring Suppl (TRUE METRIX AIR GLUCOSE METER) w/Device KIT 1 kit by Does not apply route 3 (three) times daily. 1 kit 0  . Calcium  Carb-Cholecalciferol (CALTRATE 600+D3 SOFT) 600-800 MG-UNIT CHEW Chew 1 tablet by mouth 2 (two) times daily after a meal. 60 tablet 6  . fluticasone (FLONASE) 50 MCG/ACT nasal spray Place 2 sprays into both nostrils daily. 48 g 3  . gabapentin (NEURONTIN) 300 MG capsule Take 1 capsule (300 mg total) by mouth 3 (three) times daily. 90 capsule 3  . glucose blood (TRUE METRIX BLOOD GLUCOSE TEST) test strip Use as instructed 100 each 12  . HYDROcodone-acetaminophen (NORCO/VICODIN) 5-325 MG tablet Take 1 tablet by mouth every 6 (six) hours as needed for moderate pain. 30 tablet 0  . insulin NPH-regular Human (NOVOLIN 70/30 RELION) (70-30) 100 UNIT/ML injection 45 u qam &  25 u qpm (Patient taking differently: 65 u qam & 30 u qpm) 10 mL 11  . levothyroxine (SYNTHROID, LEVOTHROID) 100 MCG tablet Take 1 tablet (100 mcg total) by mouth daily. 90 tablet 3  . meclizine (ANTIVERT) 12.5 MG tablet Take 1 tablet (12.5 mg total) by mouth 3 (three) times daily as needed for dizziness. 30 tablet 0  . metFORMIN (GLUCOPHAGE) 850 MG tablet Take 1 tablet (850 mg total) by mouth 2 (two) times daily with a meal. 180 tablet 3  . Misc Natural Products (LAXATIVE FORMULA) TABS Take 1 tablet by mouth as needed (for BM).    . ranitidine (ZANTAC) 150 MG tablet Take 1 tablet (150 mg total) by mouth 2 (two) times daily. 180 tablet 3  . TRUEPLUS LANCETS 33G MISC Use as directed 300 each 3   No current facility-administered medications on file prior to visit.    Allergies  Allergen Reactions  . Dilaudid [Hydromorphone Hcl] Nausea And Vomiting  . Lipitor [Atorvastatin] Other (See Comments)    Body & Muscle Aches  . Adhesive [Tape] Rash  . Ampicillin Rash    REACTION: Rash and welts on her hands when she took it in the hospital  . Penicillins Rash   Social History   Socioeconomic History  . Marital status: Married    Spouse name: Not on file  . Number of children: 1  . Years of education: Not on file  . Highest education  level: Not on file  Social Needs  . Financial resource strain: Not on file  . Food insecurity - worry: Not on file  . Food insecurity - inability: Not on file  . Transportation needs - medical: Not on file  . Transportation needs - non-medical: Not on file  Occupational History  . Occupation: Retired since 2003  Tobacco Use  . Smoking status: Never Smoker  . Smokeless tobacco: Never Used  Substance and Sexual Activity  . Alcohol use: No  . Drug use: No  . Sexual activity: Not on file  Other Topics Concern  . Not on file  Social History Narrative  . Not on file      Review of Systems  All other systems reviewed and are negative.      Objective:   Physical Exam  Constitutional: She appears well-developed and well-nourished.  Cardiovascular: Normal rate, regular rhythm and normal heart sounds.  Pulmonary/Chest: Effort normal and breath sounds normal.    Skin: Lesion noted. There is erythema.  Vitals reviewed.         Assessment & Plan:  Skin necrosis [facility]) Patient's previous breast surgeon has retired.  I will consult general surgery for 2 reasons #1, the active lesion appears to require debridement to allow healing.  #2 I am concerned that the reason she continues to have these recurrent lesions on the right breast could be compromised blood flow to the skin overlying her previous right mastectomy.  I am not sure if there is any surgical intervention that can be performed that will improve blood flow to that area or if we will continue to have to treat these wounds  as they arise similar to the way one would treat a burn.

## 2017-07-04 ENCOUNTER — Other Ambulatory Visit: Payer: Self-pay | Admitting: Surgery

## 2017-07-04 ENCOUNTER — Ambulatory Visit: Payer: Medicare HMO | Admitting: Podiatry

## 2017-07-04 DIAGNOSIS — L905 Scar conditions and fibrosis of skin: Secondary | ICD-10-CM | POA: Diagnosis not present

## 2017-07-04 DIAGNOSIS — L989 Disorder of the skin and subcutaneous tissue, unspecified: Secondary | ICD-10-CM | POA: Diagnosis not present

## 2017-07-06 ENCOUNTER — Telehealth: Payer: Self-pay | Admitting: Family Medicine

## 2017-07-06 DIAGNOSIS — Z794 Long term (current) use of insulin: Principal | ICD-10-CM

## 2017-07-06 DIAGNOSIS — E119 Type 2 diabetes mellitus without complications: Secondary | ICD-10-CM

## 2017-07-06 NOTE — Telephone Encounter (Signed)
Patient called stating that she self scheduled herself at [facility] for tomorrow with Dr.Wagoner. States she has seen them in the past for her diabetic foot exams. They are now asking for a referral from our office. May we have a podiatry referral.Advised patient that Dr.Pickard was out until Monday and that I would be forwarding this message to Dr.Denton Please advise?

## 2017-07-07 ENCOUNTER — Ambulatory Visit: Payer: Medicare HMO | Admitting: Podiatry

## 2017-07-07 ENCOUNTER — Other Ambulatory Visit: Payer: Self-pay | Admitting: Family Medicine

## 2017-07-07 ENCOUNTER — Ambulatory Visit (INDEPENDENT_AMBULATORY_CARE_PROVIDER_SITE_OTHER): Payer: Medicare HMO

## 2017-07-07 ENCOUNTER — Encounter: Payer: Self-pay | Admitting: Podiatry

## 2017-07-07 DIAGNOSIS — M7751 Other enthesopathy of right foot: Secondary | ICD-10-CM | POA: Diagnosis not present

## 2017-07-07 DIAGNOSIS — M7752 Other enthesopathy of left foot: Secondary | ICD-10-CM | POA: Diagnosis not present

## 2017-07-07 DIAGNOSIS — M779 Enthesopathy, unspecified: Secondary | ICD-10-CM

## 2017-07-07 DIAGNOSIS — Q828 Other specified congenital malformations of skin: Secondary | ICD-10-CM | POA: Diagnosis not present

## 2017-07-07 DIAGNOSIS — B351 Tinea unguium: Secondary | ICD-10-CM | POA: Diagnosis not present

## 2017-07-07 DIAGNOSIS — M7750 Other enthesopathy of unspecified foot: Secondary | ICD-10-CM | POA: Diagnosis not present

## 2017-07-07 DIAGNOSIS — E0843 Diabetes mellitus due to underlying condition with diabetic autonomic (poly)neuropathy: Secondary | ICD-10-CM

## 2017-07-07 DIAGNOSIS — M19071 Primary osteoarthritis, right ankle and foot: Secondary | ICD-10-CM

## 2017-07-07 DIAGNOSIS — M79676 Pain in unspecified toe(s): Secondary | ICD-10-CM | POA: Diagnosis not present

## 2017-07-07 DIAGNOSIS — M19072 Primary osteoarthritis, left ankle and foot: Secondary | ICD-10-CM | POA: Diagnosis not present

## 2017-07-07 MED ORDER — HYDROCHLOROTHIAZIDE 12.5 MG PO CAPS: 12.5000 mg | ORAL_CAPSULE | Freq: Every day | ORAL | 3 refills | Status: DC

## 2017-07-07 MED ORDER — HYDROCHLOROTHIAZIDE 12.5 MG PO CAPS: 12.5000 mg | ORAL_CAPSULE | Freq: Every day | ORAL | 0 refills | Status: DC

## 2017-07-07 NOTE — Progress Notes (Signed)
Subjective: 67 year old female presents the office today for concerns of thick, painful, elongated toenails that she could not trim herself as well as for calluses to her left feet.  She states that overall she gets pain to her foot which is been ongoing for several months and that is why she originally presented with.  She continues to get pain most of the top of the foot she also describes a sharp pain as well.  She does take gabapentin for neuropathy.  She denies any recent injury or trauma to her feet. Denies any systemic complaints such as fevers, chills, nausea, vomiting. No acute changes since last appointment, and no other complaints at this time.   Objective: AAO x3, NAD DP/PT pulses palpable bilaterally, CRT less than 3 seconds Sensation decreased with SWMF.  Nails are hypertrophic, dystrophic, discolored, elongated x10.  There is tenderness nails 1-5 bilaterally.  No surrounding edema, erythema.  There is hyperkeratotic lesions on the left foot on the second digit as well as submetatarsal 1 without any underlying ulceration drainage or any signs of infection. There is mild possible spurring present the dorsal medial aspect left midfoot there is is where she gets some tenderness.  There is no specific area pinpoint bony tenderness or pain to vibratory sensation.  There is no erythema or increase in warmth but there is chronic ankle and foot swelling bilaterally. Hammertoes present.  No open lesions or pre-ulcerative lesions.  No pain with calf compression, swelling, warmth, erythema  Assessment: 67 year old female with symptomatic onychomycosis, hyperkeratotic lesions, chronic foot pain  Plan: -All treatment options discussed with the patient including all alternatives, risks, complications.  -Nails are sharply debrided x10 without any complications or bleeding -Hyperkeratotic lesion Sharpy debrided x2 without any complications or bleeding -X-rays were obtained and reviewed.  Mild  arthritic changes are present.  There is no evidence of acute fracture.  We discussed diabetic shoes and paperwork was completed today for precertification of this.  I will have her come back and see Betha for molding.  Also we discussed journaling strength and stretching for her feet.  Continue gabapentin for neuropathy. -RTC 3 months or sooner if needed. She has no further questions today.  -Patient encouraged to call the office with any questions, concerns, change in symptoms.     [name] DPM

## 2017-07-07 NOTE — Telephone Encounter (Signed)
Order placed

## 2017-07-11 ENCOUNTER — Ambulatory Visit (INDEPENDENT_AMBULATORY_CARE_PROVIDER_SITE_OTHER): Payer: Medicare HMO | Admitting: Physical Medicine and Rehabilitation

## 2017-07-11 ENCOUNTER — Encounter (INDEPENDENT_AMBULATORY_CARE_PROVIDER_SITE_OTHER): Payer: Self-pay | Admitting: Physical Medicine and Rehabilitation

## 2017-07-11 ENCOUNTER — Ambulatory Visit (INDEPENDENT_AMBULATORY_CARE_PROVIDER_SITE_OTHER): Payer: Medicare HMO

## 2017-07-11 VITALS — BP 172/82 | HR 53 | Temp 98.2°F

## 2017-07-11 DIAGNOSIS — M5416 Radiculopathy, lumbar region: Secondary | ICD-10-CM | POA: Diagnosis not present

## 2017-07-11 DIAGNOSIS — M961 Postlaminectomy syndrome, not elsewhere classified: Secondary | ICD-10-CM

## 2017-07-11 MED ORDER — METHYLPREDNISOLONE ACETATE 80 MG/ML IJ SUSP
80.0000 mg | Freq: Once | INTRAMUSCULAR | Status: AC
  Administered 2017-07-11: 80 mg

## 2017-07-11 NOTE — Progress Notes (Signed)
.  Numeric Pain Rating Scale and Functional Assessment Average Pain 9   In the last MONTH (on 0-10 scale) has pain interfered with the following?  1. General activity like being  able to carry out your everyday physical activities such as walking, climbing stairs, carrying groceries, or moving a chair?  Rating(8)   +Driver, -BT, -Dye Allergies.  

## 2017-07-11 NOTE — Patient Instructions (Signed)

## 2017-07-17 NOTE — Procedures (Signed)
Lumbar Epidural Steroid Injection - Interlaminar Approach with Fluoroscopic Guidance  [name]      Date of Birth: 1950-05-23 MRN: [id number] PCP: Susy Frizzle, MD      Visit Date: 07/11/2017   Universal Protocol:     Consent Given By: the patient  Position: PRONE  Additional Comments: Vital signs were monitored before and after the procedure. Patient was prepped and draped in the usual sterile fashion. The correct patient, procedure, and site was verified.   Injection Procedure Details:  Procedure Site One Meds Administered:  Meds ordered this encounter  Medications  . methylPREDNISolone acetate (DEPO-MEDROL) injection 80 mg     Laterality: Left  Location/Site:  L1-L2  Needle size: 20 G  Needle type: Tuohy  Needle Placement: Paramedian epidural  Findings:   -Comments: Excellent flow of contrast into the epidural space.  Procedure Details: Using a paramedian approach from the side mentioned above, the region overlying the inferior lamina was localized under fluoroscopic visualization and the soft tissues overlying this structure were infiltrated with 4 ml. of 1% Lidocaine without Epinephrine. The Tuohy needle was inserted into the epidural space using a paramedian approach.   The epidural space was localized using loss of resistance along with lateral and bi-planar fluoroscopic views.  After negative aspirate for air, blood, and CSF, a 2 ml. volume of Isovue-250 was injected into the epidural space and the flow of contrast was observed. Radiographs were obtained for documentation purposes.    The injectate was administered into the level noted above.   Additional Comments:  The patient tolerated the procedure well Dressing: Band-Aid    Post-procedure details: Patient was observed during the procedure. Post-procedure instructions were reviewed.  Patient left the clinic in stable condition.

## 2017-07-17 NOTE — Progress Notes (Signed)
[name] - 67 y.o. female MRN [id number]  Date of birth: 26-Mar-1951  Office Visit Note: Visit Date: 07/11/2017 PCP: Susy Frizzle, MD Referred by: Susy Frizzle, MD  Subjective: Chief Complaint  Patient presents with  . Lower Back - Pain   HPI: [name] is a 67 year old female with prior lumbar fusion from L2 to the sacrum.  She continues to have worsening low back and left radicular leg pain.  She comes in today at the request of Dr. Louanne Skye for diagnostic left interlaminar injection above the level of fusion.   ROS Otherwise per HPI.  Assessment & Plan: Visit Diagnoses:  1. Lumbar radiculopathy   2. Post laminectomy syndrome     Plan: No additional findings.   Meds & Orders:  Meds ordered this encounter  Medications  . methylPREDNISolone acetate (DEPO-MEDROL) injection 80 mg    Orders Placed This Encounter  Procedures  . XR C-ARM NO REPORT  . Epidural Steroid injection    Follow-up: Return for Dr. Louanne Skye.   Procedures: No procedures performed  Lumbar Epidural Steroid Injection - Interlaminar Approach with Fluoroscopic Guidance  [name]      Date of Birth: 09/15/1950 MRN: [id number] PCP: Susy Frizzle, MD      Visit Date: 07/11/2017   Universal Protocol:     Consent Given By: the patient  Position: PRONE  Additional Comments: Vital signs were monitored before and after the procedure. Patient was prepped and draped in the usual sterile fashion. The correct patient, procedure, and site was verified.   Injection Procedure Details:  Procedure Site One Meds Administered:  Meds ordered this encounter  Medications  . methylPREDNISolone acetate (DEPO-MEDROL) injection 80 mg     Laterality: Left  Location/Site:  L1-L2  Needle size: 20 G  Needle type: Tuohy  Needle Placement: Paramedian epidural  Findings:   -Comments: Excellent flow of contrast into the epidural space.  Procedure Details: Using a  paramedian approach from the side mentioned above, the region overlying the inferior lamina was localized under fluoroscopic visualization and the soft tissues overlying this structure were infiltrated with 4 ml. of 1% Lidocaine without Epinephrine. The Tuohy needle was inserted into the epidural space using a paramedian approach.   The epidural space was localized using loss of resistance along with lateral and bi-planar fluoroscopic views.  After negative aspirate for air, blood, and CSF, a 2 ml. volume of Isovue-250 was injected into the epidural space and the flow of contrast was observed. Radiographs were obtained for documentation purposes.    The injectate was administered into the level noted above.   Additional Comments:  The patient tolerated the procedure well Dressing: Band-Aid    Post-procedure details: Patient was observed during the procedure. Post-procedure instructions were reviewed.  Patient left the clinic in stable condition.   Clinical History: MRI THORACIC SPINE WITHOUT CONTRAST  MRI LUMBAR SPINE WITH AND WITHOUT CONTRAST  TECHNIQUE: Multiplanar and multiecho pulse sequences of the thoracic spine were obtained without intravenous contrast. Multiplanar and multiecho pulse sequences of the lumbar spine were obtained with and without intravenous contrast.  CONTRAST:  10 mL MultiHance intravenous contrast.  Creatinine was obtained on site at [facility] at [address]  Results: Creatinine 0.9 mg/dL.  COMPARISON:  CTA chest dated March 05, 2017. Lumbar spine x-rays dated October 14, 2015. CT lumbar spine dated October 21, 2014. MRI lumbar spine dated June 02, 2014.  FINDINGS: MRI THORACIC SPINE FINDINGS  Alignment:  Physiologic.  Vertebrae: No fracture, evidence of discitis, or bone lesion. Prior C4-C5 ACDF seen on localizer sequence.  Cord:  Normal signal and morphology.  Paraspinal and other soft tissues:  Negative.  Disc levels:  T1-T2: Tiny central disc protrusion.  No stenosis.  T2-T3: Tiny central disc protrusion.  No stenosis.  T3-T4 and T4-T5: Negative.  T5-T6: Tiny central disc protrusion.  No stenosis.  T6-T7: Tiny left paracentral disc protrusion.  No stenosis.  T7-T8: Tiny central and right paracentral disc protrusion. No stenosis.  T8-T9 to T11-T12: Negative.  MRI LUMBAR SPINE FINDINGS  Segmentation: Transitional lumbosacral anatomy with lumbarization of S1.  Alignment: New trace retrolisthesis at L1-L2. Unchanged trace retrolisthesis at L2-L3. Unchanged trace anterolisthesis at L4-L5 and L5-S1.  Vertebrae: Prior L2-S1 posterior fusion with interbody disc spacers. Unchanged mild superior endplate compression deformity of L2. Both L2 pedicle screws are again noted to protrude through the is superior endplate of L2. The right L3 pedicle screw also protrudes through the superior endplate. This is unchanged. No acute fracture, evidence of discitis, or focal bone lesion.  Conus medullaris and cauda equina: Conus extends to the L1-L2 level. Conus and cauda equina appear normal. No abnormal enhancement.  Paraspinal and other soft tissues: Negative.  Disc levels:  T12-L1: Central disc extrusion now migrating more inferiorly, up to 8 mm. No stenosis.  L1-L2: New central disc protrusion and right paracentral disc osteophyte complex. Mild bilateral facet arthropathy with ligamentum flavum hypertrophy. Mild central spinal canal stenosis. No neuroforaminal stenosis.  L2-L3: Prior right laminectomy and posterior fusion. Unchanged mild right neuroforaminal stenosis. No spinal canal or left neuroforaminal stenosis.  L3-L4: Prior right laminectomy and posterior fusion. Unchanged mild right neuroforaminal stenosis. No spinal canal or left neuroforaminal stenosis.  L4-L5: Prior posterior decompression and fusion. No residual stenosis.  L5-S1:  Prior posterior decompression and fusion. No residual stenosis.  S1-S2: Vestigial disc space with mild bilateral facet arthropathy. No stenosis.  IMPRESSION: MR THORACIC SPINE:  1. Minimal multilevel degenerative disc disease. No spinal canal or neuroforaminal stenosis.  MR LUMBAR SPINE:  1. Transitional lumbosacral anatomy. Increasing with prior studies, the transitional segment is labeled S1. 2. Prior L2-S1 fusion with unchanged bilateral L2 and right L3 pedicle screws protruding through their respective superior endplates. 3. Unchanged mild superior endplate compression deformity of L2. 4. New mild central spinal canal stenosis at L1-L2 due to central disc protrusion and right paracentral disc osteophyte complex. No nerve impingement. 5. Unchanged mild right neuroforaminal stenosis at L2-L3 and L3-L4.   Electronically Signed   By: Titus Dubin M.D.   On: 05/28/2017 14:41   She reports that she has never smoked. She has never used smokeless tobacco.  Recent Labs    09/07/16 1123 12/19/16 1225  HGBA1C 11.3 9.3*    Objective:  VS:  HT:    WT:   BMI:     BP:(!) 172/82  HR:(!) 53bpm  TEMP:98.2 F (36.8 C)( )  RESP:98 % Physical Exam  Musculoskeletal:  Patient ambulates with aid.  She does have good distal strength.  She is very stiff in the lumbar spine.    Ortho Exam Imaging: No results found.  Past Medical/Family/Surgical/Social History: Medications & Allergies reviewed per EMR, new medications updated. Patient Active Problem List   Diagnosis Date Noted  . Type II diabetes mellitus ([place])   . Sciatica   . PONV (postoperative nausea and vomiting)   . Peripheral neuropathy   . Osteoarthritis   . Migraines   . Irritable  bowel syndrome   . Hypertension   . History of right mastectomy   . History of kidney stones   . H/O hiatal hernia   . GERD (gastroesophageal reflux disease)   . Family history of anesthesia complication   . Chronic lower  back pain   . Breast cancer ([place])   . Arthritis   . Diabetes mellitus type 2, uncontrolled, with complications ([place])   . Lower extremity edema   . Positive D dimer 03/05/2017  . Elevated d-dimer   . Elevated troponin   . Chronic constipation 06/23/2015  . Obesity 06/12/2015  . Chronic diastolic heart failure ([place]) 11/17/2014  . HNP (herniated nucleus pulposus), lumbar 08/22/2014    Class: Chronic  . Herniated nucleus pulposus, L2-3 right 02/04/2014    Class: Acute  . Urinary retention 05/04/2012    Class: Temporary  . Spondylolisthesis of lumbar region 04/30/2012    Class: Chronic  . Lumbar stenosis with neurogenic claudication 04/30/2012    Class: Chronic  . Chest pain 03/20/2012  . Anginal pain ([place]) 03/19/2012  . Right shoulder pain 12/07/2010  . ABDOMINAL PAIN, LOWER 09/21/2009  . BACK PAIN 03/18/2009  . SYNCOPE-CAROTID SINUS 09/16/2008  . Essential hypertension 08/05/2008  . Hypothyroidism 08/23/2006  . Diabetes with neurologic complications ([place]) 63/04/6008  . Diabetic neuropathy ([place]) 08/23/2006  . CARDIOMYOPATHY 08/23/2006  . GERD 08/23/2006  . OSTEOARTHRITIS 08/23/2006  . DDD (degenerative disc disease), cervical 08/23/2006  . [place] DISEASE, LUMBAR 08/23/2006  . Headache(784.0) 08/23/2006  . BREAST CANCER, HX OF 08/23/2006  . HYPERPARATHYROIDISM, HX OF 08/23/2006  . RENAL CALCULUS, HX OF 08/23/2006   Past Medical History:  Diagnosis Date  . Anginal pain ([place]) 03/19/2012   saw Dr. Cathie Olden .. she thinks its more related to stomach issues  . Arthritis    "all over; mostly in my back" (03/20/2012)  . Breast cancer ([place])    "right" (03/20/2012)  . Cardiomyopathy   . Chest pain 06/2005   Hospitalized, dystolic dysfunction  . Chronic lower back pain    "have 2 herniated discs; going to have to have a fusion" (03/20/2012)  . Family history of anesthesia complication    "father has nausea and vomiting"  . GERD (gastroesophageal reflux disease)   . H/O hiatal  hernia   . History of kidney stones   . History of right mastectomy   . Hypertension    "patient states never had HTN, takes Hyzaar for heart  . Hypothyroidism   . Irritable bowel syndrome   . Migraines    "it's been a long time since I've had one" (03/20/2012)  . Osteoarthritis   . Peripheral neuropathy   . PONV (postoperative nausea and vomiting)   . Sciatica   . Type II diabetes mellitus ([facility])    Family History  Problem Relation Age of Onset  . Hypertension Mother   . Diabetes Father   . Hypertension Father   . Hypertension Sister   . Hypertension Brother   . Hypertension Brother   . Diabetes Brother   . Diabetes Other        Family Hx. of Diabetes  . Hypertension Other        Family History of HTN  . Deep vein thrombosis Daughter    Past Surgical History:  Procedure Laterality Date  . ABDOMINAL HYSTERECTOMY  1993  . adenosin cardiolite  07/2005   (+) wall motion abnormality  . AXILLARY LYMPH NODE DISSECTION  2003   squamous cell cancer  .  BACK SURGERY    . CARDIAC CATHETERIZATION  ? 2005 / 2007  . CT abdomen and pelvis  02/2010   Same  . ESOPHAGOGASTRODUODENOSCOPY  2005   GERD  . KNEE ARTHROSCOPY  1990's   "right" (03/20/2012)  . LUMBAR FUSION N/A 08/22/2014   Procedure: Right L2-3 and right L1-2 transforaminal lumbar interbody fusion with pedicle screws, rods, sleeves, cages, local bone graft, Vivigen, cancellous chips;  Surgeon: Jessy Oto, MD;  Location: [facility];  Service: Orthopedics;  Laterality: N/A;  . LUMBAR LAMINECTOMY/DECOMPRESSION MICRODISCECTOMY N/A 02/04/2014   Procedure: RIGHT L2-3 MICRODISCECTOMY ;  Surgeon: Jessy Oto, MD;  Location: [facility];  Service: Orthopedics;  Laterality: N/A;  . MASTECTOMY WITH AXILLARY LYMPH NODE DISSECTION  12/2003   "right" (03/20/2012)  . POSTERIOR CERVICAL FUSION/FORAMINOTOMY  2001  . SHOULDER ARTHROSCOPY W/ ROTATOR CUFF REPAIR  2003   Left  . THYROIDECTOMY  1977   Right  . TOTAL KNEE ARTHROPLASTY  2000   Right   . [place] of abdomen  07/2005   Fatty liver / kidney stones   Social History   Occupational History  . Occupation: Retired since 2003  Tobacco Use  . Smoking status: Never Smoker  . Smokeless tobacco: Never Used  Substance and Sexual Activity  . Alcohol use: No  . Drug use: No  . Sexual activity: Not on file

## 2017-07-18 ENCOUNTER — Other Ambulatory Visit: Payer: Medicare HMO

## 2017-07-20 ENCOUNTER — Ambulatory Visit (INDEPENDENT_AMBULATORY_CARE_PROVIDER_SITE_OTHER): Payer: Medicare HMO | Admitting: Specialist

## 2017-07-20 ENCOUNTER — Encounter (INDEPENDENT_AMBULATORY_CARE_PROVIDER_SITE_OTHER): Payer: Self-pay | Admitting: Specialist

## 2017-07-20 VITALS — BP 134/68 | HR 68 | Ht 65.0 in | Wt 205.0 lb

## 2017-07-20 DIAGNOSIS — M5136 Other intervertebral disc degeneration, lumbar region: Secondary | ICD-10-CM

## 2017-07-20 DIAGNOSIS — M47812 Spondylosis without myelopathy or radiculopathy, cervical region: Secondary | ICD-10-CM

## 2017-07-20 DIAGNOSIS — M5126 Other intervertebral disc displacement, lumbar region: Secondary | ICD-10-CM

## 2017-07-20 MED ORDER — DICLOFENAC SODIUM 75 MG PO TBEC: 75.0000 mg | DELAYED_RELEASE_TABLET | Freq: Two times a day (BID) | ORAL | 3 refills | Status: DC

## 2017-07-20 MED ORDER — HYDROCODONE-ACETAMINOPHEN 5-325 MG PO TABS: 1.0000 | ORAL_TABLET | Freq: Four times a day (QID) | ORAL | 0 refills | Status: DC | PRN

## 2017-07-20 NOTE — Patient Instructions (Addendum)
Avoid frequent bending and stooping  No lifting greater than 10 lbs. May use ice or moist heat for pain. Weight loss is of benefit. Handicap license is approved. Trial of diclofenac 75 mg po BID for arthritis pain. Continue with hydrocodone intermittantly Back brace half days as needed.   PT for cervical arthritis.

## 2017-07-20 NOTE — Progress Notes (Signed)
Office Visit Note   [name]           Date of Birth: March 02, 1951           MRN: [id number] Visit Date: 07/20/2017              Requested by: Susy Frizzle, MD [address] [place], [place] [address] PCP: Susy Frizzle, MD   Assessment & Plan: Visit Diagnoses:  1. Spondylosis without myelopathy or radiculopathy, cervical region   2. Herniation of lumbar intervertebral disc   3. Other intervertebral disc degeneration, lumbar region     Plan: Avoid frequent bending and stooping  No lifting greater than 10 lbs. May use ice or moist heat for pain. Weight loss is of benefit. Handicap license is approved. Trial of diclofenac 75 mg po BID for arthritis pain. Continue with hydrocodone intermittantly Back brace half days as needed.   PT for cervical arthritis.  Follow-Up Instructions: Return in about 6 weeks (around 08/31/2017).   Orders:  Orders Placed This Encounter  Procedures  . Ambulatory referral to Physical Therapy   Meds ordered this encounter  Medications  . diclofenac (VOLTAREN) 75 MG EC tablet    Sig: Take 1 tablet (75 mg total) by mouth 2 (two) times daily.    Dispense:  60 tablet    Refill:  3  . HYDROcodone-acetaminophen (NORCO/VICODIN) 5-325 MG tablet    Sig: Take 1 tablet by mouth every 6 (six) hours as needed for moderate pain.    Dispense:  30 tablet    Refill:  0      Procedures: No procedures performed   Clinical Data: No additional findings.   Subjective: No chief complaint on file.   67 year old female post L2 to S1 fusion with transition segment S1-S2 or L5-S1 in which case the fusion would be from L1-L5 but in either case she experienced about one day of significant relief of pain with an ESI at the level immediately above her fusion L1-2 or T12-L1 depending on the labeling of the transition segment. Her pain improved for about one day by more than 50%. But the pain recurred on the next day. She also  experiences numbness on the right side lateral thigh and calf and has night pain in this area.    Review of Systems  Constitutional: Negative.   HENT: Negative.   Eyes: Negative.   Respiratory: Negative.   Cardiovascular: Negative.   Gastrointestinal: Negative.   Endocrine: Negative.   Genitourinary: Negative.   Musculoskeletal: Negative.   Skin: Negative.   Allergic/Immunologic: Negative.   Neurological: Negative.   Hematological: Negative.   Psychiatric/Behavioral: Negative.      Objective: Vital Signs: BP 134/68   Pulse 68   Ht 5\' 5"  (1.651 m)   Wt 205 lb (93 kg)   BMI 34.11 kg/m   Physical Exam  Constitutional: She is oriented to person, place, and time. She appears well-developed and well-nourished.  HENT:  Head: Normocephalic and atraumatic.  Eyes: Pupils are equal, round, and reactive to light. EOM are normal.  Neck: Normal range of motion. Neck supple.  Pulmonary/Chest: Effort normal and breath sounds normal.  Abdominal: Soft. Bowel sounds are normal.  Neurological: She is alert and oriented to person, place, and time.  Skin: Skin is warm and dry.  Psychiatric: She has a normal mood and affect. Her behavior is normal. Judgment and thought content normal.    Back Exam   Tenderness  The patient is experiencing tenderness in the lumbar.  Range of Motion  Extension: abnormal  Flexion: abnormal  Lateral bend right: abnormal  Lateral bend left: abnormal  Rotation right: abnormal  Rotation left: abnormal   Muscle Strength  Right Quadriceps:  5/5  Left Quadriceps:  5/5  Right Hamstrings:  5/5  Left Hamstrings:  5/5   Tests  Straight leg raise right: negative Straight leg raise left: negative  Reflexes  Patellar: abnormal Achilles: abnormal Babinski's sign: normal   Other  Toe walk: normal Heel walk: normal Sensation: normal Gait: normal  Erythema: no back redness Scars: absent      Specialty Comments:  No specialty comments  available.  Imaging: No results found.   PMFS History: Patient Active Problem List   Diagnosis Date Noted  . HNP (herniated nucleus pulposus), lumbar 08/22/2014    Priority: High    Class: Chronic  . Herniated nucleus pulposus, L2-3 right 02/04/2014    Priority: High    Class: Acute  . Lumbar stenosis with neurogenic claudication 04/30/2012    Priority: High    Class: Chronic  . Type II diabetes mellitus ([place])   . Sciatica   . PONV (postoperative nausea and vomiting)   . Peripheral neuropathy   . Osteoarthritis   . Migraines   . Irritable bowel syndrome   . Hypertension   . History of right mastectomy   . History of kidney stones   . H/O hiatal hernia   . GERD (gastroesophageal reflux disease)   . Family history of anesthesia complication   . Chronic lower back pain   . Breast cancer ([place])   . Arthritis   . Diabetes mellitus type 2, uncontrolled, with complications ([place])   . Lower extremity edema   . Positive D dimer 03/05/2017  . Elevated d-dimer   . Elevated troponin   . Chronic constipation 06/23/2015  . Obesity 06/12/2015  . Chronic diastolic heart failure ([employer]) 11/17/2014  . Urinary retention 05/04/2012    Class: Temporary  . Spondylolisthesis of lumbar region 04/30/2012    Class: Chronic  . Chest pain 03/20/2012  . Anginal pain ([place]) 03/19/2012  . Right shoulder pain 12/07/2010  . ABDOMINAL PAIN, LOWER 09/21/2009  . BACK PAIN 03/18/2009  . SYNCOPE-CAROTID SINUS 09/16/2008  . Essential hypertension 08/05/2008  . Hypothyroidism 08/23/2006  . Diabetes with neurologic complications ([place]) 41/93/7902  . Diabetic neuropathy ([place]) 08/23/2006  . CARDIOMYOPATHY 08/23/2006  . GERD 08/23/2006  . OSTEOARTHRITIS 08/23/2006  . DDD (degenerative disc disease), cervical 08/23/2006  . [place] DISEASE, LUMBAR 08/23/2006  . Headache(784.0) 08/23/2006  . BREAST CANCER, HX OF 08/23/2006  . HYPERPARATHYROIDISM, HX OF 08/23/2006  . RENAL CALCULUS, HX OF 08/23/2006   Past  Medical History:  Diagnosis Date  . Anginal pain ([place]) 03/19/2012   saw Dr. Cathie Olden .. she thinks its more related to stomach issues  . Arthritis    "all over; mostly in my back" (03/20/2012)  . Breast cancer ([place])    "right" (03/20/2012)  . Cardiomyopathy   . Chest pain 06/2005   Hospitalized, dystolic dysfunction  . Chronic lower back pain    "have 2 herniated discs; going to have to have a fusion" (03/20/2012)  . Family history of anesthesia complication    "father has nausea and vomiting"  . GERD (gastroesophageal reflux disease)   . H/O hiatal hernia   . History of kidney stones   . History of right mastectomy   . Hypertension    "patient states  never had HTN, takes Hyzaar for heart  . Hypothyroidism   . Irritable bowel syndrome   . Migraines    "it's been a long time since I've had one" (03/20/2012)  . Osteoarthritis   . Peripheral neuropathy   . PONV (postoperative nausea and vomiting)   . Sciatica   . Type II diabetes mellitus ([facility])     Family History  Problem Relation Age of Onset  . Hypertension Mother   . Diabetes Father   . Hypertension Father   . Hypertension Sister   . Hypertension Brother   . Hypertension Brother   . Diabetes Brother   . Diabetes Other        Family Hx. of Diabetes  . Hypertension Other        Family History of HTN  . Deep vein thrombosis Daughter     Past Surgical History:  Procedure Laterality Date  . ABDOMINAL HYSTERECTOMY  1993  . adenosin cardiolite  07/2005   (+) wall motion abnormality  . AXILLARY LYMPH NODE DISSECTION  2003   squamous cell cancer  . BACK SURGERY    . CARDIAC CATHETERIZATION  ? 2005 / 2007  . CT abdomen and pelvis  02/2010   Same  . ESOPHAGOGASTRODUODENOSCOPY  2005   GERD  . KNEE ARTHROSCOPY  1990's   "right" (03/20/2012)  . LUMBAR FUSION N/A 08/22/2014   Procedure: Right L2-3 and right L1-2 transforaminal lumbar interbody fusion with pedicle screws, rods, sleeves, cages, local bone graft, Vivigen,  cancellous chips;  Surgeon: Jessy Oto, MD;  Location: [facility];  Service: Orthopedics;  Laterality: N/A;  . LUMBAR LAMINECTOMY/DECOMPRESSION MICRODISCECTOMY N/A 02/04/2014   Procedure: RIGHT L2-3 MICRODISCECTOMY ;  Surgeon: Jessy Oto, MD;  Location: [facility];  Service: Orthopedics;  Laterality: N/A;  . MASTECTOMY WITH AXILLARY LYMPH NODE DISSECTION  12/2003   "right" (03/20/2012)  . POSTERIOR CERVICAL FUSION/FORAMINOTOMY  2001  . SHOULDER ARTHROSCOPY W/ ROTATOR CUFF REPAIR  2003   Left  . THYROIDECTOMY  1977   Right  . TOTAL KNEE ARTHROPLASTY  2000   Right  . [place] of abdomen  07/2005   Fatty liver / kidney stones   Social History   Occupational History  . Occupation: Retired since 2003  Tobacco Use  . Smoking status: Never Smoker  . Smokeless tobacco: Never Used  Substance and Sexual Activity  . Alcohol use: No  . Drug use: No  . Sexual activity: Not on file

## 2017-08-04 ENCOUNTER — Encounter: Payer: Medicare HMO | Admitting: Podiatry

## 2017-08-04 ENCOUNTER — Encounter: Payer: Self-pay | Admitting: Podiatry

## 2017-08-06 NOTE — Progress Notes (Signed)
Patient states that she does not know why she is here she does not want to be seen.  Patient left without being seen her today.  Splint was apparently made for evaluation of a Nurse, mental health.

## 2017-08-15 ENCOUNTER — Ambulatory Visit: Payer: Medicare HMO

## 2017-08-15 DIAGNOSIS — E1142 Type 2 diabetes mellitus with diabetic polyneuropathy: Secondary | ICD-10-CM | POA: Diagnosis not present

## 2017-08-15 DIAGNOSIS — M2041 Other hammer toe(s) (acquired), right foot: Secondary | ICD-10-CM | POA: Diagnosis not present

## 2017-08-15 DIAGNOSIS — M2042 Other hammer toe(s) (acquired), left foot: Secondary | ICD-10-CM | POA: Diagnosis not present

## 2017-08-16 ENCOUNTER — Emergency Department (HOSPITAL_BASED_OUTPATIENT_CLINIC_OR_DEPARTMENT_OTHER)
Admission: EM | Admit: 2017-08-16 | Discharge: 2017-08-17 | Disposition: A | Discharge status: Home / Self Care | Payer: Medicare HMO | Attending: Emergency Medicine | Admitting: Emergency Medicine

## 2017-08-16 ENCOUNTER — Ambulatory Visit (INDEPENDENT_AMBULATORY_CARE_PROVIDER_SITE_OTHER): Payer: Medicare HMO | Admitting: Family Medicine

## 2017-08-16 ENCOUNTER — Emergency Department (HOSPITAL_BASED_OUTPATIENT_CLINIC_OR_DEPARTMENT_OTHER): Payer: Medicare HMO

## 2017-08-16 ENCOUNTER — Encounter (HOSPITAL_BASED_OUTPATIENT_CLINIC_OR_DEPARTMENT_OTHER): Payer: Self-pay

## 2017-08-16 ENCOUNTER — Other Ambulatory Visit: Payer: Self-pay

## 2017-08-16 VITALS — Temp 98.0°F | Wt 202.0 lb

## 2017-08-16 DIAGNOSIS — R2689 Other abnormalities of gait and mobility: Secondary | ICD-10-CM

## 2017-08-16 DIAGNOSIS — R51 Headache: Secondary | ICD-10-CM | POA: Insufficient documentation

## 2017-08-16 DIAGNOSIS — Z853 Personal history of malignant neoplasm of breast: Secondary | ICD-10-CM | POA: Diagnosis not present

## 2017-08-16 DIAGNOSIS — R42 Dizziness and giddiness: Secondary | ICD-10-CM | POA: Insufficient documentation

## 2017-08-16 DIAGNOSIS — Z7982 Long term (current) use of aspirin: Secondary | ICD-10-CM | POA: Insufficient documentation

## 2017-08-16 DIAGNOSIS — E1140 Type 2 diabetes mellitus with diabetic neuropathy, unspecified: Secondary | ICD-10-CM | POA: Insufficient documentation

## 2017-08-16 DIAGNOSIS — I10 Essential (primary) hypertension: Secondary | ICD-10-CM | POA: Diagnosis not present

## 2017-08-16 DIAGNOSIS — R519 Headache, unspecified: Secondary | ICD-10-CM

## 2017-08-16 DIAGNOSIS — Z794 Long term (current) use of insulin: Secondary | ICD-10-CM | POA: Insufficient documentation

## 2017-08-16 DIAGNOSIS — M542 Cervicalgia: Secondary | ICD-10-CM | POA: Diagnosis not present

## 2017-08-16 DIAGNOSIS — E039 Hypothyroidism, unspecified: Secondary | ICD-10-CM | POA: Insufficient documentation

## 2017-08-16 DIAGNOSIS — Z91040 Latex allergy status: Secondary | ICD-10-CM | POA: Diagnosis not present

## 2017-08-16 DIAGNOSIS — Z79899 Other long term (current) drug therapy: Secondary | ICD-10-CM | POA: Diagnosis not present

## 2017-08-16 LAB — CBC
HCT: 47.3 % — ABNORMAL HIGH (ref 36.0–46.0)
Hemoglobin: 16.1 g/dL — ABNORMAL HIGH (ref 12.0–15.0)
MCH: 30.1 pg (ref 26.0–34.0)
MCHC: 34 g/dL (ref 30.0–36.0)
MCV: 88.4 fL (ref 78.0–100.0)
PLATELETS: 243 10*3/uL (ref 150–400)
RBC: 5.35 MIL/uL — AB (ref 3.87–5.11)
RDW: 14.5 % (ref 11.5–15.5)
WBC: 7.9 10*3/uL (ref 4.0–10.5)

## 2017-08-16 LAB — BASIC METABOLIC PANEL
Anion gap: 14 (ref 5–15)
BUN: 42 mg/dL — AB (ref 6–20)
CALCIUM: 11.4 mg/dL — AB (ref 8.9–10.3)
CO2: 23 mmol/L (ref 22–32)
CREATININE: 1.05 mg/dL — AB (ref 0.44–1.00)
Chloride: 104 mmol/L (ref 101–111)
GFR calc non Af Amer: 53 mL/min — ABNORMAL LOW (ref >60)
Glucose, Bld: 189 mg/dL — ABNORMAL HIGH (ref 65–99)
Potassium: 3.7 mmol/L (ref 3.5–5.1)
SODIUM: 141 mmol/L (ref 135–145)

## 2017-08-16 LAB — URINALYSIS, MICROSCOPIC (REFLEX)

## 2017-08-16 LAB — URINALYSIS, ROUTINE W REFLEX MICROSCOPIC
Bilirubin Urine: NEGATIVE
GLUCOSE, UA: 250 mg/dL — AB
Ketones, ur: NEGATIVE mg/dL
Nitrite: NEGATIVE
PH: 6 (ref 5.0–8.0)
PROTEIN: 100 mg/dL — AB
Specific Gravity, Urine: 1.025 (ref 1.005–1.030)

## 2017-08-16 NOTE — ED Notes (Signed)
IV attempted x1 without success

## 2017-08-16 NOTE — Patient Instructions (Signed)
Go to ER (you can go to [facility]) for work up.

## 2017-08-16 NOTE — Progress Notes (Signed)
Patient ID: [name], female    DOB: 12/10/1950, 67 y.o.   MRN: [id number]  PCP: Susy Frizzle, MD  Chief Complaint  Patient presents with  . Dizziness    Patient in today with c/o dizziness, pressure in head, neck pain to right side of neck, and decreased hearing.    Subjective:   [name] is a 67 y.o. female,  past medical history of cardiomyopathy, diastolic heart failure, uncontrolled diabetes, GERD, migraine, headaches, severe degenerative disc disease and chronic back pain, presents to clinic with chief complaint of 3 weeks of dizziness with loss of balance.  Dizziness has been constant, described as "dizzy headed" she does not feel like room or objects around her are spinning but she does feel like at any moment she will lose her balance and fall over.  She also describes it as very unsteady she has to hold onto things to be able to walk which is new for her.  She does typically walk with a cane due to history of chronic back pain and severe arthritis of multiple joints, but she is never had an issue with her balance or coordination.  She denies any past history of stroke.  Dizziness is worsened with any movement of her body or head, also but it seems to worsen on its own such as when she is standing in the kitchen making dinner she feels like she is about to fall over even when she is not moving at all.  When going from sitting to standing she feels also lightheaded.  These worsening episodes and persistent vertigo are not associated with palpitations, chest pain, shortness of breath, diaphoresis.  She has had similar symptoms in the past but not as bad not as constant it was treated with a pill which made it feel better.  She feels like if she could just throw up she would feel much better.  Dizziness is associated with decreased hearing bilaterally but her hearing is worse in her right ear than in her left.  When her symptoms began 3 weeks ago she also complains of a  severe headache which is described as a pressure, "pumped up head with air" and she feels like she just needs to "pop it and let it go down" headache is severe and constant.  She also complains also of neck pain but she cannot say if this is new or not she has chronic neck and back pain.  She denies fever, chills.  No new or worsening shortness of breath with exertion, lower extremity edema.  She is a poorly controlled diabetic and states that her sugars have been high 100s with her fasting morning sugars, 175-190, and around lunch they are in the 200s.  She denies any change to her appetite, urination, weight.  Patient had meclizine low-dose prescribed last year, September 2018.  She had spinning sensation but did not have similar balance issues that she is having currently.  Patient Active Problem List   Diagnosis Date Noted  . Type II diabetes mellitus ([place])   . Sciatica   . PONV (postoperative nausea and vomiting)   . Peripheral neuropathy   . Osteoarthritis   . Migraines   . Irritable bowel syndrome   . Hypertension   . History of right mastectomy   . History of kidney stones   . H/O hiatal hernia   . GERD (gastroesophageal reflux disease)   . Family history of anesthesia complication   . Chronic lower  back pain   . Breast cancer ([place])   . Arthritis   . Diabetes mellitus type 2, uncontrolled, with complications ([place])   . Lower extremity edema   . Positive D dimer 03/05/2017  . Elevated d-dimer   . Elevated troponin   . Chronic constipation 06/23/2015  . Obesity 06/12/2015  . Chronic diastolic heart failure ([place]) 11/17/2014  . HNP (herniated nucleus pulposus), lumbar 08/22/2014    Class: Chronic  . Herniated nucleus pulposus, L2-3 right 02/04/2014    Class: Acute  . Urinary retention 05/04/2012    Class: Temporary  . Spondylolisthesis of lumbar region 04/30/2012    Class: Chronic  . Lumbar stenosis with neurogenic claudication 04/30/2012    Class: Chronic  . Chest pain  03/20/2012  . Anginal pain ([place]) 03/19/2012  . Right shoulder pain 12/07/2010  . ABDOMINAL PAIN, LOWER 09/21/2009  . BACK PAIN 03/18/2009  . SYNCOPE-CAROTID SINUS 09/16/2008  . Essential hypertension 08/05/2008  . Hypothyroidism 08/23/2006  . Diabetes with neurologic complications ([place]) 29/47/6546  . Diabetic neuropathy ([employer]) 08/23/2006  . CARDIOMYOPATHY 08/23/2006  . GERD 08/23/2006  . OSTEOARTHRITIS 08/23/2006  . DDD (degenerative disc disease), cervical 08/23/2006  . [place] DISEASE, LUMBAR 08/23/2006  . Headache(784.0) 08/23/2006  . BREAST CANCER, HX OF 08/23/2006  . HYPERPARATHYROIDISM, HX OF 08/23/2006  . RENAL CALCULUS, HX OF 08/23/2006     Prior to Admission medications   Medication Sig Start Date End Date Taking? Authorizing Provider  acetaminophen (TYLENOL) 500 MG tablet Take 500 mg by mouth every 4 (four) hours as needed for mild pain or headache.   Yes [provider]  Alcohol Swabs (B-D SINGLE USE SWABS REGULAR) PADS Use with insulin injections 05/17/17  Yes Susy Frizzle, MD  aspirin 81 MG tablet Take 81 mg by mouth daily.   Yes [provider]  Blood Glucose Calibration (TRUE METRIX LEVEL 2) Normal SOLN Use as directed 05/17/17  Yes Susy Frizzle, MD  Blood Glucose Monitoring Suppl (TRUE METRIX AIR GLUCOSE METER) w/Device KIT 1 kit by Does not apply route 3 (three) times daily. 05/10/17  Yes Susy Frizzle, MD  Calcium Carb-Cholecalciferol (CALTRATE 600+D3 SOFT) 600-800 MG-UNIT CHEW Chew 1 tablet by mouth 2 (two) times daily after a meal. 04/12/17  Yes Jessy Oto, MD  diclofenac (VOLTAREN) 75 MG EC tablet Take 1 tablet (75 mg total) by mouth 2 (two) times daily. 07/20/17  Yes Jessy Oto, MD  fluticasone (FLONASE) 50 MCG/ACT nasal spray Place 2 sprays into both nostrils daily. 05/10/17  Yes Susy Frizzle, MD  gabapentin (NEURONTIN) 300 MG capsule Take 1 capsule (300 mg total) by mouth 3 (three) times daily. 06/14/17  Yes Susy Frizzle, MD    glucose blood (TRUE METRIX BLOOD GLUCOSE TEST) test strip Use as instructed 05/10/17  Yes Susy Frizzle, MD  hydrochlorothiazide (MICROZIDE) 12.5 MG capsule Take 1 capsule (12.5 mg total) by mouth daily. 07/07/17  Yes Susy Frizzle, MD  HYDROcodone-acetaminophen (NORCO/VICODIN) 5-325 MG tablet Take 1 tablet by mouth every 6 (six) hours as needed for moderate pain. 07/20/17  Yes Jessy Oto, MD  insulin NPH-regular Human (NOVOLIN 70/30 RELION) (70-30) 100 UNIT/ML injection 45 u qam & 25 u qpm Patient taking differently: 65 u qam & 30 u qpm 05/10/17  Yes Susy Frizzle, MD  levothyroxine (SYNTHROID, LEVOTHROID) 100 MCG tablet Take 1 tablet (100 mcg total) by mouth daily. 05/17/17  Yes Susy Frizzle, MD  losartan (COZAAR) 50 MG tablet  Take 1 tablet (50 mg total) by mouth daily. 06/26/17  Yes Susy Frizzle, MD  meclizine (ANTIVERT) 12.5 MG tablet Take 1 tablet (12.5 mg total) by mouth 3 (three) times daily as needed for dizziness. 12/19/16  Yes [place], Modena Nunnery, MD  metFORMIN (GLUCOPHAGE) 850 MG tablet Take 1 tablet (850 mg total) by mouth 2 (two) times daily with a meal. 09/21/16  Yes [place], Cammie Mcgee, MD  Misc Natural Products (LAXATIVE FORMULA) TABS Take 1 tablet by mouth as needed (for BM).   Yes [provider]  ranitidine (ZANTAC) 150 MG tablet Take 1 tablet (150 mg total) by mouth 2 (two) times daily. 05/18/17  Yes Susy Frizzle, MD  TRUEPLUS LANCETS 33G MISC Use as directed 05/10/17  Yes Susy Frizzle, MD     Allergies  Allergen Reactions  . Dilaudid [Hydromorphone Hcl] Nausea And Vomiting  . Lipitor [Atorvastatin] Other (See Comments)    Body & Muscle Aches  . Adhesive [Tape] Rash  . Ampicillin Rash    REACTION: Rash and welts on her hands when she took it in the hospital  . Penicillins Rash     Family History  Problem Relation Age of Onset  . Hypertension Mother   . Diabetes Father   . Hypertension Father   . Hypertension Sister   . Hypertension  Brother   . Hypertension Brother   . Diabetes Brother   . Diabetes Other        Family Hx. of Diabetes  . Hypertension Other        Family History of HTN  . Deep vein thrombosis Daughter      Social History   Socioeconomic History  . Marital status: Married    Spouse name: Not on file  . Number of children: 1  . Years of education: Not on file  . Highest education level: Not on file  Occupational History  . Occupation: Retired since 2003  Social Needs  . Financial resource strain: Not on file  . Food insecurity:    Worry: Not on file    Inability: Not on file  . Transportation needs:    Medical: Not on file    Non-medical: Not on file  Tobacco Use  . Smoking status: Never Smoker  . Smokeless tobacco: Never Used  Substance and Sexual Activity  . Alcohol use: No  . Drug use: No  . Sexual activity: Not on file  Lifestyle  . Physical activity:    Days per week: Not on file    Minutes per session: Not on file  . Stress: Not on file  Relationships  . Social connections:    Talks on phone: Not on file    Gets together: Not on file    Attends religious service: Not on file    Active member of club or organization: Not on file    Attends meetings of clubs or organizations: Not on file    Relationship status: Not on file  . Intimate partner violence:    Fear of current or ex partner: Not on file    Emotionally abused: Not on file    Physically abused: Not on file    Forced sexual activity: Not on file  Other Topics Concern  . Not on file  Social History Narrative  . Not on file     Review of Systems  Constitutional: Negative for activity change, appetite change, chills, diaphoresis, fatigue, fever and unexpected weight change.  HENT: Positive for sinus  pressure and sinus pain. Negative for congestion, dental problem, ear discharge, ear pain, facial swelling, hearing loss, postnasal drip, rhinorrhea, sneezing, sore throat, tinnitus, trouble swallowing and voice  change.   Eyes: Negative.  Negative for visual disturbance.  Respiratory: Negative.   Cardiovascular: Negative.   Gastrointestinal: Negative.   Endocrine: Negative.   Genitourinary: Negative.   Musculoskeletal: Positive for arthralgias, back pain, gait problem, myalgias and neck pain. Negative for joint swelling.  Skin: Negative.   Allergic/Immunologic: Negative.   Neurological: Positive for dizziness, light-headedness and headaches. Negative for tremors, seizures, syncope, facial asymmetry, speech difficulty, weakness and numbness.  Hematological: Negative.   Psychiatric/Behavioral: Negative.  Negative for confusion.  All other systems reviewed and are negative.      Objective:    Vitals:   08/16/17 1432  Temp: 98 F (36.7 C)  TempSrc: Oral  SpO2: 98%  Weight: 202 lb (91.6 kg)    08/16/17 2:32 PM  08/16/17 2:33 PM  08/16/17 2:34 PM      BP  150/90  144/88  122/80   BP Location  LeftArm  LeftArm  LeftArm   Patient Position  Supine  Sitting  Standing   Cuff Size  Normal  Normal  Normal   Pulse  64  69  79   Temp  61F(36.7C)     Temp src  Oral     SpO2  98%     Weight  202lb(91.6kg)         Physical Exam  Constitutional: She is oriented to person, place, and time. She appears well-developed and well-nourished. No distress.  Obese female, appears stated age, appears uncomfortable, sits in a chair holding onto it with eyes closed, no acute distress, nontoxic-appearing  HENT:  Head: Normocephalic and atraumatic.  Right Ear: Tympanic membrane, external ear and ear canal normal. No drainage, swelling or tenderness. No mastoid tenderness. Tympanic membrane is not injected, not scarred, not perforated, not erythematous, not retracted and not bulging. No middle ear effusion. No hemotympanum. Decreased hearing is noted.  Left Ear: External ear normal. No drainage, swelling or tenderness. Decreased hearing is noted.  Nose: No epistaxis. Right sinus exhibits  maxillary sinus tenderness and frontal sinus tenderness. Left sinus exhibits maxillary sinus tenderness and frontal sinus tenderness.  Mouth/Throat: Uvula is midline and oropharynx is clear and moist. Mucous membranes are not pale, dry and not cyanotic. No oropharyngeal exudate. No tonsillar exudate.  Left TM occluded with brown shiny cerumen, visible external auditory canal normal in appearance  Eyes: Pupils are equal, round, and reactive to light. Conjunctivae, EOM and lids are normal. Right eye exhibits no chemosis and no discharge. Left eye exhibits no chemosis and no discharge. Right eye exhibits normal extraocular motion and no nystagmus. Left eye exhibits normal extraocular motion and no nystagmus. Right pupil is round and reactive. Left pupil is round and reactive. Pupils are equal.  Neck: Trachea normal and phonation normal. Muscular tenderness present. No edema and no erythema present. No thyromegaly present.  Cardiovascular: Regular rhythm and normal heart sounds. PMI is not displaced. Exam reveals no gallop and no friction rub.  No murmur heard. Pulses:      Radial pulses are 2+ on the right side, and 2+ on the left side.  Lower extremity ankle and pedal edema 1+, worse on the left than the right  Pulmonary/Chest: Effort normal. No stridor. No respiratory distress. She has wheezes. She has no rales. She exhibits no tenderness.  Abdominal: Soft. Bowel sounds are normal. She  exhibits no distension and no mass. There is no tenderness. There is no guarding.  Lymphadenopathy:    She has no cervical adenopathy.  Neurological: She is alert and oriented to person, place, and time. She displays no atrophy and no tremor. No sensory deficit. She exhibits normal muscle tone. She displays no seizure activity. Gait abnormal. Coordination normal.  Cranial nerve II, III, IV, VI grossly intact V: normal sensation, difficult to assess bite strength (poor effort vs poor dentition) VII symmetrical eyebrow  raise, eyelid close, pucker and mildly asymmetrical smile with decreased tone on the left side of her face VIII hearing diminished bilaterally (cerumen impaction on left, patient to become more dizzy when attempting to irrigate left ear) IX, X, XI, XII: grossly intact -tongue midline Normal finger-nose-finger, rapid alternating hand movements Normal sensation to light touch in all extremities 5 out of 5 symmetrical grip strength bilaterally, 4 out of 5 strength bilaterally with dorsiflexion, plantarflexion and flexion extension of the knees, may be secondary to pain Patient unable to do Romberg test, no pronator drift Unable to do heel-to-shin, walking on heels or toes or tandem gait test Unsteady gait patient has to hold onto objects in the room to build to walk (At baseline uses a cane which she does not have with her today.)  Skin: Skin is warm. Capillary refill takes less than 2 seconds. No rash noted. She is not diaphoretic. No erythema. No pallor.  Psychiatric: She has a normal mood and affect. Her behavior is normal. Judgment and thought content normal.  Nursing note and vitals reviewed.    Vitals:   08/16/17 1432  Temp: 98 F (36.7 C)  TempSrc: Oral  SpO2: 98%  Weight: 202 lb (91.6 kg)         Assessment & Plan:      ICD-10-CM   1. Type 2 diabetes mellitus with diabetic polyneuropathy, with long-term current use of insulin (HCC) E11.42 Glucose, fingerstick (stat)   Z79.4   2. Vertigo R42   3. Nonintractable headache, unspecified chronicity pattern, unspecified headache type R51   4. Loss of balance R26.89      3 weeks of persistent vertigo with difficulty ambulating and with balance, she has been having to hold onto things to be able to walk, with standing even after getting steady in the kitchen to be able to cook she is feeling like she is going to fall over and has to hold onto the counter and calls her husband to help make sure she does not fall.  He describes it as  being dizzy headed with an associated severe headache that she describes as "pumped up head with a air" associated with some decreased hearing, worse in the right than left.    She does also have lightheaded episodes when standing up, orthostatic here in clinic 384 systolic lying and 665 standing, no hypotension but she is more symptomatic with positional changes.  Attempted cerumen removal on the left with irrigation patient could not tolerate due to vertigo.  She cannot ambulate in the room independently she does have to hold onto me or hold onto furniture.  She is unable to get on and off the exam table without holding onto everything, closing her eyes for prolonged period of time.  She is unable to do many aspects of neuro testing, included unable to do heel-to-shin, gait testing, heel-toe.  She cannot stand and hold arms out for Romberg with eyes open, with holding pt she sways with closing eyes, no  pronator drift.  No dysmetria she is able to do finger-to-nose testing and rapid alternating movements of her hands, coordination seems presevered.  Cranial nerves, oculomotor trochlear and abducens normal, trigeminal normal, she does have symmetrical eyebrow raise but slightly asymmetrical smile with decreased tone on the left side of her face She has cerumen impaction in left ear, unable to remove.   Hearing decreased b/l with finger run, worse on right per pt.   DDx: vertigo, vestibular dysfunction, CVA Cannot rule out central vertigo with history and exam With HA, abnormal neuro exam, change to gait and pt high risk, sent to ER for workup Pt wishes to go [facility] for work up, I did explain that if she has similar neuro exam and negative CT, they may need to transfer her to [facility] for MRI, but that will depend on eval and results, etc.    Gave report to Dr. Verner Chol at [place]. 4:20 PM   Delsa Grana, PA-C 08/16/17 3:21 PM

## 2017-08-16 NOTE — ED Provider Notes (Signed)
[facility] EMERGENCY DEPARTMENT Provider Note   CSN: [id number] Arrival date & time: 08/16/17  1634     History   Chief Complaint Chief Complaint  Patient presents with  . Dizziness    HPI [name] is a 67 y.o. female who presents with headache and dizziness.  Past medical history significant for history of breast cancer, chronic low back pain, hypertension, history of migraine headaches, history of vertigo, diabetes, thyroid disease.  She states for the past month she has had gradually worsening dizziness and headache.  She takes Vicodin, Excedrin, Tylenol for her headaches with temporary relief.  The pain is over the front and the back of the head.  There is no acute onset.  It does not feel similar to migraines that she had in the past.  At the same time she has had gradually worsening dizziness.  She feels like she is spinning and feels off balance when walking.  She has been having to hold onto things while she walks to the house.  She has not had any falls or injuries due to this.  She reports associated right sided neck pain. She denies unilateral weakness, acute vision change, numbness or tingling.  She was seen by primary care provider today was referred to the ED to rule out CNS pathology.  They attempted to irrigate her ears but were unsuccessful.  HPI  Past Medical History:  Diagnosis Date  . Anginal pain ([place]) 03/19/2012   saw Dr. Cathie Olden .. she thinks its more related to stomach issues  . Arthritis    "all over; mostly in my back" (03/20/2012)  . Breast cancer ([place])    "right" (03/20/2012)  . Cardiomyopathy   . Chest pain 06/2005   Hospitalized, dystolic dysfunction  . Chronic lower back pain    "have 2 herniated discs; going to have to have a fusion" (03/20/2012)  . Family history of anesthesia complication    "father has nausea and vomiting"  . GERD (gastroesophageal reflux disease)   . H/O hiatal hernia   . History of kidney stones   . History  of right mastectomy   . Hypertension    "patient states never had HTN, takes Hyzaar for heart  . Hypothyroidism   . Irritable bowel syndrome   . Migraines    "it's been a long time since I've had one" (03/20/2012)  . Osteoarthritis   . Peripheral neuropathy   . PONV (postoperative nausea and vomiting)   . Sciatica   . Type II diabetes mellitus [facility])     Patient Active Problem List   Diagnosis Date Noted  . Type II diabetes mellitus ([place])   . Sciatica   . PONV (postoperative nausea and vomiting)   . Peripheral neuropathy   . Osteoarthritis   . Migraines   . Irritable bowel syndrome   . Hypertension   . History of right mastectomy   . History of kidney stones   . H/O hiatal hernia   . GERD (gastroesophageal reflux disease)   . Family history of anesthesia complication   . Chronic lower back pain   . Breast cancer ([place])   . Arthritis   . Diabetes mellitus type 2, uncontrolled, with complications ([place])   . Lower extremity edema   . Positive D dimer 03/05/2017  . Elevated d-dimer   . Elevated troponin   . Chronic constipation 06/23/2015  . Obesity 06/12/2015  . Chronic diastolic heart failure ([place]) 11/17/2014  . HNP (herniated nucleus pulposus),  lumbar 08/22/2014    Class: Chronic  . Herniated nucleus pulposus, L2-3 right 02/04/2014    Class: Acute  . Urinary retention 05/04/2012    Class: Temporary  . Spondylolisthesis of lumbar region 04/30/2012    Class: Chronic  . Lumbar stenosis with neurogenic claudication 04/30/2012    Class: Chronic  . Chest pain 03/20/2012  . Anginal pain ([place]) 03/19/2012  . Right shoulder pain 12/07/2010  . ABDOMINAL PAIN, LOWER 09/21/2009  . BACK PAIN 03/18/2009  . SYNCOPE-CAROTID SINUS 09/16/2008  . Essential hypertension 08/05/2008  . Hypothyroidism 08/23/2006  . Diabetes with neurologic complications ([place]) 32/91/9166  . Diabetic neuropathy ([place]) 08/23/2006  . CARDIOMYOPATHY 08/23/2006  . GERD 08/23/2006  . OSTEOARTHRITIS  08/23/2006  . DDD (degenerative disc disease), cervical 08/23/2006  . [place] DISEASE, LUMBAR 08/23/2006  . Headache(784.0) 08/23/2006  . BREAST CANCER, HX OF 08/23/2006  . HYPERPARATHYROIDISM, HX OF 08/23/2006  . RENAL CALCULUS, HX OF 08/23/2006    Past Surgical History:  Procedure Laterality Date  . ABDOMINAL HYSTERECTOMY  1993  . adenosin cardiolite  07/2005   (+) wall motion abnormality  . AXILLARY LYMPH NODE DISSECTION  2003   squamous cell cancer  . BACK SURGERY    . CARDIAC CATHETERIZATION  ? 2005 / 2007  . CT abdomen and pelvis  02/2010   Same  . ESOPHAGOGASTRODUODENOSCOPY  2005   GERD  . KNEE ARTHROSCOPY  1990's   "right" (03/20/2012)  . LUMBAR FUSION N/A 08/22/2014   Procedure: Right L2-3 and right L1-2 transforaminal lumbar interbody fusion with pedicle screws, rods, sleeves, cages, local bone graft, Vivigen, cancellous chips;  Surgeon: Jessy Oto, MD;  Location: [facility];  Service: Orthopedics;  Laterality: N/A;  . LUMBAR LAMINECTOMY/DECOMPRESSION MICRODISCECTOMY N/A 02/04/2014   Procedure: RIGHT L2-3 MICRODISCECTOMY ;  Surgeon: Jessy Oto, MD;  Location: [facility];  Service: Orthopedics;  Laterality: N/A;  . MASTECTOMY WITH AXILLARY LYMPH NODE DISSECTION  12/2003   "right" (03/20/2012)  . POSTERIOR CERVICAL FUSION/FORAMINOTOMY  2001  . SHOULDER ARTHROSCOPY W/ ROTATOR CUFF REPAIR  2003   Left  . THYROIDECTOMY  1977   Right  . TOTAL KNEE ARTHROPLASTY  2000   Right  . [place] of abdomen  07/2005   Fatty liver / kidney stones     OB History   None      Home Medications    Prior to Admission medications   Medication Sig Start Date End Date Taking? Authorizing Provider  acetaminophen (TYLENOL) 500 MG tablet Take 500 mg by mouth every 4 (four) hours as needed for mild pain or headache.    [provider]  Alcohol Swabs (B-D SINGLE USE SWABS REGULAR) PADS Use with insulin injections 05/17/17   Susy Frizzle, MD  aspirin 81 MG tablet Take 81 mg by mouth  daily.    [provider]  Blood Glucose Calibration (TRUE METRIX LEVEL 2) Normal SOLN Use as directed 05/17/17   Susy Frizzle, MD  Blood Glucose Monitoring Suppl (TRUE METRIX AIR GLUCOSE METER) w/Device KIT 1 kit by Does not apply route 3 (three) times daily. 05/10/17   Susy Frizzle, MD  Calcium Carb-Cholecalciferol (CALTRATE 600+D3 SOFT) 600-800 MG-UNIT CHEW Chew 1 tablet by mouth 2 (two) times daily after a meal. 04/12/17   Jessy Oto, MD  diclofenac (VOLTAREN) 75 MG EC tablet Take 1 tablet (75 mg total) by mouth 2 (two) times daily. 07/20/17   Jessy Oto, MD  fluticasone (FLONASE) 50 MCG/ACT nasal spray  Place 2 sprays into both nostrils daily. 05/10/17   Susy Frizzle, MD  gabapentin (NEURONTIN) 300 MG capsule Take 1 capsule (300 mg total) by mouth 3 (three) times daily. 06/14/17   Susy Frizzle, MD  glucose blood (TRUE METRIX BLOOD GLUCOSE TEST) test strip Use as instructed 05/10/17   Susy Frizzle, MD  hydrochlorothiazide (MICROZIDE) 12.5 MG capsule Take 1 capsule (12.5 mg total) by mouth daily. 07/07/17   Susy Frizzle, MD  HYDROcodone-acetaminophen (NORCO/VICODIN) 5-325 MG tablet Take 1 tablet by mouth every 6 (six) hours as needed for moderate pain. 07/20/17   Jessy Oto, MD  insulin NPH-regular Human (NOVOLIN 70/30 RELION) (70-30) 100 UNIT/ML injection 45 u qam & 25 u qpm Patient taking differently: 65 u qam & 30 u qpm 05/10/17   Susy Frizzle, MD  levothyroxine (SYNTHROID, LEVOTHROID) 100 MCG tablet Take 1 tablet (100 mcg total) by mouth daily. 05/17/17   Susy Frizzle, MD  losartan (COZAAR) 50 MG tablet Take 1 tablet (50 mg total) by mouth daily. 06/26/17   Susy Frizzle, MD  meclizine (ANTIVERT) 12.5 MG tablet Take 1 tablet (12.5 mg total) by mouth 3 (three) times daily as needed for dizziness. 12/19/16   Alycia Rossetti, MD  metFORMIN (GLUCOPHAGE) 850 MG tablet Take 1 tablet (850 mg total) by mouth 2 (two) times daily with a meal. 09/21/16    Pickard, Cammie Mcgee, MD  Misc Natural Products (LAXATIVE FORMULA) TABS Take 1 tablet by mouth as needed (for BM).    [provider]  ranitidine (ZANTAC) 150 MG tablet Take 1 tablet (150 mg total) by mouth 2 (two) times daily. 05/18/17   Susy Frizzle, MD  TRUEPLUS LANCETS 33G MISC Use as directed 05/10/17   Susy Frizzle, MD    Family History Family History  Problem Relation Age of Onset  . Hypertension Mother   . Diabetes Father   . Hypertension Father   . Hypertension Sister   . Hypertension Brother   . Hypertension Brother   . Diabetes Brother   . Diabetes Other        Family Hx. of Diabetes  . Hypertension Other        Family History of HTN  . Deep vein thrombosis Daughter     Social History Social History   Tobacco Use  . Smoking status: Never Smoker  . Smokeless tobacco: Never Used  Substance Use Topics  . Alcohol use: No  . Drug use: No     Allergies   Dilaudid [hydromorphone hcl]; Lipitor [atorvastatin]; Adhesive [tape]; Ampicillin; Latex; and Penicillins   Review of Systems Review of Systems  Constitutional: Negative for fever.  Cardiovascular: Negative for chest pain.  Musculoskeletal: Positive for gait problem and neck pain.  Neurological: Positive for dizziness and headaches. Negative for syncope, weakness, light-headedness and numbness.  All other systems reviewed and are negative.    Physical Exam Updated Vital Signs BP (!) 157/62 (BP Location: Left Arm)   Pulse 62   Temp 98.1 F (36.7 C) (Oral)   Resp 14   SpO2 98%   Physical Exam  Constitutional: She is oriented to person, place, and time. She appears well-developed and well-nourished. No distress.  Calm, cooperative  HENT:  Head: Normocephalic and atraumatic.  Right Ear: Tympanic membrane normal.  Unable to visualize left TM due to cerumen  Eyes: Pupils are equal, round, and reactive to light. Conjunctivae are normal. Right eye exhibits no discharge. Left eye exhibits  no  discharge. No scleral icterus.  Neck: Normal range of motion.  Cardiovascular: Normal rate and regular rhythm.  Pulmonary/Chest: Effort normal and breath sounds normal. No respiratory distress.  Abdominal: She exhibits no distension.  Neurological: She is alert and oriented to person, place, and time.  Mental Status:  Alert, oriented, thought content appropriate, able to give a coherent history. Speech fluent without evidence of aphasia. Able to follow 2 step commands without difficulty.  Cranial Nerves:  II:  Peripheral visual fields grossly normal, pupils equal, round, reactive to light III,IV, VI: ptosis not present, extra-ocular motions intact bilaterally  V,VII: smile symmetric, facial light touch sensation equal VIII: hearing grossly normal to voice  X: uvula elevates symmetrically  XI: bilateral shoulder shrug symmetric and strong XII: midline tongue extension without fassiculations Motor:  Normal tone. 5/5 in upper and lower extremities bilaterally including strong and equal grip strength and dorsiflexion/plantar flexion Sensory: Pinprick and light touch normal in all extremities.  Cerebellar: normal finger-to-nose with bilateral upper extremities Gait: Off balance - reaching and grabbing things to hold on to CV: distal pulses palpable throughout    Skin: Skin is warm and dry.  Psychiatric: She has a normal mood and affect. Her behavior is normal.  Nursing note and vitals reviewed.    ED Treatments / Results  Labs (all labs ordered are listed, but only abnormal results are displayed) Labs Reviewed  URINALYSIS, ROUTINE W REFLEX MICROSCOPIC - Abnormal; Notable for the following components:      Result Value   Glucose, UA 250 (*)    Hgb urine dipstick SMALL (*)    Protein, ur 100 (*)    Leukocytes, UA MODERATE (*)    All other components within normal limits  URINALYSIS, MICROSCOPIC (REFLEX) - Abnormal; Notable for the following components:   Bacteria, UA FEW (*)    All  other components within normal limits  BASIC METABOLIC PANEL - Abnormal; Notable for the following components:   Glucose, Bld 189 (*)    BUN 42 (*)    Creatinine, Ser 1.05 (*)    Calcium 11.4 (*)    GFR calc non Af Amer 53 (*)    All other components within normal limits  CBC - Abnormal; Notable for the following components:   RBC 5.35 (*)    Hemoglobin 16.1 (*)    HCT 47.3 (*)    All other components within normal limits    EKG EKG Interpretation  Date/Time:  Wednesday Aug 16 2017 17:56:24 EDT Ventricular Rate:  65 PR Interval:    QRS Duration: 103 QT Interval:  407 QTC Calculation: 424 R Axis:   -11 Text Interpretation:  Normal sinus rhythm Left ventricular hypertrophy no significant change since Nov 2018 Confirmed by Sherwood Gambler [phone]) on 08/16/2017 6:03:42 PM   Radiology Ct Head Wo Contrast  Result Date: 08/16/2017 CLINICAL DATA:  Acute headache EXAM: CT HEAD WITHOUT CONTRAST TECHNIQUE: Contiguous axial images were obtained from the base of the skull through the vertex without intravenous contrast. COMPARISON:  None. FINDINGS: Brain: No mass lesion, intraparenchymal hemorrhage or extra-axial collection. No evidence of acute cortical infarct. Normal appearance of the brain parenchyma and extra axial spaces for age. Vascular: No hyperdense vessel or unexpected vascular calcification. Skull: Normal visualized skull base, calvarium and extracranial soft tissues. Sinuses/Orbits: No sinus fluid levels or advanced mucosal thickening. No mastoid effusion. Normal orbits. IMPRESSION: Normal head CT. Electronically Signed   By: Ulyses Jarred M.D.   On: 08/16/2017 20:04  Procedures Procedures (including critical care time)  Medications Ordered in ED Medications - No data to display   Initial Impression / Assessment and Plan / ED Course  I have reviewed the triage vital signs and the nursing notes.  Pertinent labs & imaging results that were available during my care of the  patient were reviewed by me and considered in my medical decision making (see chart for details).  67 year old female presents with headache, dizziness, gait disturbance for approximately one month. Her vitals are stable. Her neurologic exam is concerning due to her gait disturbance. She does not have dysmetria but is very unsteady on her feet. Unclear if this is due to vertigo or not. Labs are overall unremarkable. She appears to have a small degree of hemoconcentration. UA does not show obvious signs of infection although she has moderate leukocytes, few bacteria, 11-20 WBC. CT head is negative. Shared visit with Dr. Regenia Skeeter. We think she will need a MRI and MRA of the head and neck to r/o a stroke. Discussed with Dr. Francia Greaves who will accept the patient at [facility].  Final Clinical Impressions(s) / ED Diagnoses   Final diagnoses:  Dizziness  Nonintractable episodic headache, unspecified headache type    ED Discharge Orders    None       Recardo Evangelist, PA-C 08/16/17 2124    Sherwood Gambler, MD 08/22/17 [phone]

## 2017-08-16 NOTE — ED Triage Notes (Signed)
C/o dizziness, fullness in ears, HA x 3-4 weeks-NAD-presents to triage in w/c

## 2017-08-16 NOTE — ED Notes (Addendum)
Per Regenia Skeeter MD verbal order to POV to [place] for MRI , verbal understanding from pt and family, IV removed per policy d/t POV

## 2017-08-16 NOTE — ED Notes (Signed)
Attempted two IV sticks without success, EDP aware

## 2017-08-16 NOTE — ED Notes (Signed)
Patient to CT via wheelchair.

## 2017-08-16 NOTE — ED Notes (Addendum)
Dr Regenia Skeeter gave this Rn verbal orders for MRI and MRA. Pt in waiting room and MRI aware that pt is here.

## 2017-08-17 ENCOUNTER — Emergency Department (HOSPITAL_COMMUNITY): Payer: Medicare HMO

## 2017-08-17 DIAGNOSIS — R51 Headache: Secondary | ICD-10-CM | POA: Diagnosis not present

## 2017-08-17 DIAGNOSIS — R42 Dizziness and giddiness: Secondary | ICD-10-CM | POA: Diagnosis not present

## 2017-08-17 MED ORDER — GADOBENATE DIMEGLUMINE 529 MG/ML IV SOLN
20.0000 mL | Freq: Once | INTRAVENOUS | Status: AC | PRN
  Administered 2017-08-17: 20 mL via INTRAVENOUS

## 2017-08-17 NOTE — ED Provider Notes (Signed)
Patient sent from [facility] emergency department for MRI evaluation.  Patient reports a one-month history of difficulty walking, and balance.  Patient has undergone MRI and no acute pathology is noted.  She has evidence of microvascular disease but no stroke, normal neck and brain vasculature.  Patient referred back to her primary care doctor, will also recommend outpatient neurology follow-up.   Orpah Greek, MD 08/17/17 [phone]

## 2017-08-17 NOTE — Discharge Instructions (Addendum)
Schedule follow-up with your primary care doctor.  Also call one of the listed neurology groups to schedule a follow-up appointment for further outpatient work-up.

## 2017-08-18 ENCOUNTER — Telehealth: Payer: Self-pay | Admitting: Family Medicine

## 2017-08-18 MED ORDER — MECLIZINE HCL 12.5 MG PO TABS: ORAL_TABLET | ORAL | 1 refills | Status: DC

## 2017-08-18 MED ORDER — CEPHALEXIN 500 MG PO CAPS: 500.0000 mg | ORAL_CAPSULE | Freq: Three times a day (TID) | ORAL | 0 refills | Status: DC

## 2017-08-18 NOTE — Telephone Encounter (Signed)
Patient calling to see if she can get a referral to dr Jeneen Rinks [place]  For her ear issues  [phone]

## 2017-08-18 NOTE — Telephone Encounter (Signed)
[facility] on home phone

## 2017-08-18 NOTE — Telephone Encounter (Signed)
Per Verline Lema - ok to send in Meclizine prn dizziness at 12.5mg  1-2 tab po tid and keflex 500mg  tid x 5 days to treat UTI as seen on urine done by hospital. F/U apt made for next week and informed pt to use debrox bid until apt. Will hold off on referral until seen in ov to see if wax is the cause of dizziness. Pt verbalizes understanding and meds sent to pharm.

## 2017-08-18 NOTE — Telephone Encounter (Signed)
LMTRC

## 2017-08-21 ENCOUNTER — Ambulatory Visit: Payer: Medicare HMO | Admitting: Podiatry

## 2017-08-21 ENCOUNTER — Encounter: Payer: Self-pay | Admitting: Podiatry

## 2017-08-21 DIAGNOSIS — L600 Ingrowing nail: Secondary | ICD-10-CM | POA: Diagnosis not present

## 2017-08-21 MED ORDER — DOXYCYCLINE HYCLATE 100 MG PO TABS: 100.0000 mg | ORAL_TABLET | Freq: Two times a day (BID) | ORAL | 0 refills | Status: DC

## 2017-08-21 NOTE — Patient Instructions (Addendum)

## 2017-08-22 ENCOUNTER — Other Ambulatory Visit: Payer: Self-pay

## 2017-08-22 ENCOUNTER — Ambulatory Visit (INDEPENDENT_AMBULATORY_CARE_PROVIDER_SITE_OTHER): Payer: Medicare HMO | Admitting: Family Medicine

## 2017-08-22 ENCOUNTER — Encounter: Payer: Self-pay | Admitting: Family Medicine

## 2017-08-22 VITALS — BP 140/78 | HR 65 | Temp 97.9°F | Ht 65.0 in | Wt 206.2 lb

## 2017-08-22 DIAGNOSIS — R8290 Unspecified abnormal findings in urine: Secondary | ICD-10-CM | POA: Diagnosis not present

## 2017-08-22 DIAGNOSIS — H6122 Impacted cerumen, left ear: Secondary | ICD-10-CM

## 2017-08-22 DIAGNOSIS — L600 Ingrowing nail: Secondary | ICD-10-CM | POA: Insufficient documentation

## 2017-08-22 DIAGNOSIS — D582 Other hemoglobinopathies: Secondary | ICD-10-CM | POA: Diagnosis not present

## 2017-08-22 DIAGNOSIS — R799 Abnormal finding of blood chemistry, unspecified: Secondary | ICD-10-CM

## 2017-08-22 DIAGNOSIS — H6092 Unspecified otitis externa, left ear: Secondary | ICD-10-CM | POA: Diagnosis not present

## 2017-08-22 DIAGNOSIS — R42 Dizziness and giddiness: Secondary | ICD-10-CM | POA: Diagnosis not present

## 2017-08-22 DIAGNOSIS — J309 Allergic rhinitis, unspecified: Secondary | ICD-10-CM

## 2017-08-22 LAB — URINALYSIS, ROUTINE W REFLEX MICROSCOPIC
BILIRUBIN URINE: NEGATIVE
Bacteria, UA: NONE SEEN /HPF
Hgb urine dipstick: NEGATIVE
KETONES UR: NEGATIVE
LEUKOCYTES UA: NEGATIVE
NITRITE: NEGATIVE
RBC / HPF: NONE SEEN /HPF (ref 0–2)
SPECIFIC GRAVITY, URINE: 1.015 (ref 1.001–1.03)
pH: 7 (ref 5.0–8.0)

## 2017-08-22 LAB — MICROSCOPIC MESSAGE

## 2017-08-22 MED ORDER — FLUTICASONE PROPIONATE 50 MCG/ACT NA SUSP: 2.0000 | Freq: Every day | NASAL | 6 refills | Status: DC

## 2017-08-22 MED ORDER — NEOMYCIN-POLYMYXIN-HC 3.5-10000-1 OT SOLN: 4.0000 [drp] | Freq: Four times a day (QID) | OTIC | 0 refills | Status: AC

## 2017-08-22 NOTE — Progress Notes (Signed)
Subjective: 67 year old female presents the office with concerns of a painful ingrown toenails which is been ongoing for last several days and is been worse for the last 2 days.  She is the area is very painful with pressure and she has been using red oil which is been helping.  She denies any drainage or pus coming from the area.  She has noticed some mild swelling to the toenail site.  No other concerns. Denies any systemic complaints such as fevers, chills, nausea, vomiting. No acute changes since last appointment, and no other complaints at this time.   Objective: AAO x3, NAD DP/PT pulses palpable 2/4 bilaterally, CRT less than 3 seconds Nails in general hypertrophic, dystrophic, discolored yellow to brown discoloration.  On the left hallux toenail on the medial aspect there is incurvation of the skin there is localized edema to the nail corner but there is no erythema, ascending cellulitis.  There is no fluctuation or crepitation or malodor. No open lesions or pre-ulcerative lesions.  No pain with calf compression, swelling, warmth, erythema  Assessment: Ingrown toenail left medial hallux; onychomycosis  Plan: -All treatment options discussed with the patient including all alternatives, risks, complications.  -At this time, the patient is requesting partial nail removal with chemical matricectomy to the symptomatic portion of the nail.  We discussed alternatives of this.  Risks and complications were discussed with the patient for which they understand and written consent was obtained. Under sterile conditions a total of 3 mL of a mixture of 2% lidocaine plain and 0.5% Marcaine plain was infiltrated in a hallux block fashion. Once anesthetized, the skin was prepped in sterile fashion. A tourniquet was then applied. Next the medial aspect of hallux nail border was then sharply excised making sure to remove the entire offending nail border. Once the nails were ensured to be removed area was  debrided and the underlying skin was intact. There is no purulence identified in the procedure. Next phenol was then applied under standard conditions and copiously irrigated. Silvadene was applied. A dry sterile dressing was applied. After application of the dressing the tourniquet was removed and there is found to be an immediate capillary refill time to the digit. The patient tolerated the procedure well any complications. Post procedure instructions were discussed the patient for which he verbally understood. Follow-up in one week for nail check or sooner if any problems are to arise. Discussed signs/symptoms of infection and directed to call the office immediately should any occur or go directly to the emergency room. In the meantime, encouraged to call the office with any questions, concerns, changes symptoms. -Rx doxycycline  -As a courtesy I debrided her right hallux toenail without any complications. -Patient encouraged to call the office with any questions, concerns, change in symptoms.   [name] DPM

## 2017-08-22 NOTE — Progress Notes (Signed)
Patient ID: [name], female    DOB: 1951/03/24, 67 y.o.   MRN: [id number]  PCP: Susy Frizzle, MD  Chief Complaint  Patient presents with  . ears stopped up    Subjective:   [name] is a 67 y.o. female, returns to office with continued vertigo, worsening left ear feeling "stopped up" after initial visit last week and ER work-up 08/16/17-08/17/17. ER records have been reviewed at length by myself, I have also consulted with ER physician Dr. Verner Chol.  Last week patient presented with a severe headache described as pressure, with vertigo symptoms that had all been ongoing for several weeks and worsening, with inability to walk independently or maintain balance standing in the middle of the room.  She did not have any ataxia or dysmetria however balance was compromised, patient's past medical history complicated enough that she was sent to the ER to rule out more serious pathology such as CVA.  CT head without contrast was negative for mass lesions, and a parenchymal hemorrhage, sinuses without fluid levels or advanced mucosal thickening, vasculature normal per age, overall normal head CT.  MRI of head was negative, no acute intracranial abnormality, pertinent for nonspecific patchy hyperintense flaring which is stated most likely chronic small vessel ischemic disease, mild in nature.  MRA of head, normal intracranial MRA.  MRA neck showed short segment mild stenosis, approximated at 35%, of left internal carotid artery, no other high-grade or critical flow limiting stenosis in neck.   ER note said lab work was unremarkable however she did have elevated glucose 189, BUN was 42, more than doubled from her most recent labs of 17, serum creatinine 1.05, last lab was 0.91, hemoglobin and hematocrit elevated, agree with ER note of likely hemoconcentration, likely secondary to mild dehydration.  Urinalysis in the ER was pertinent for moderate leuks, small blood, glucose, microscopy few  bacteria present, 6-10 red blood cells, 11-20 white blood cells, but many squamous epithelials so it was not a very clean sample.  No urine culture was done.  He was discharged from the ER the next morning without any medications, ER suggested neurology referral. I reviewed lab results and ER documentation on May 9 and 10 and we attempted to contact the patient, treated vertigo with meclizine trial.  Patient was asked to come in to give a new urine sample to run a culture on but she was unable to come in on Friday, so she was covered with Keflex for over the weekend in case of possible UTI causing vertigo imbalance issues.  Also last week's visit she had cerumen impaction on the left, was unable to successfully disimpact in clinic because she could not tolerate irrigation, her vertigo was so severe she could not even open her eyes or move her head.  She was instructed to use Debrox drops twice a day and return this week for recheck of vertigo and for cerumen removal. She returns this morning, has been using Debrox drops, has been taking meclizine and Keflex.  Her left ear she states is more stopped up, and she does have new pain and worsening ringing in her ear.  Pain is sore and tender, moderate in nature, she believes it is worse with the Debrox drops.  She denies any instrumentation of her left ear.  Her hearing is overall decreased on the left.  Pain, decreased hearing, sensation of being stopped up is fairly constant, tinnitus is intermittent.   She believes her vertigo is and  not any better with the meclizine.  She did try 2 pills at once, 25 mg the first day and decreased it to taking 12.5 mg to 3 times a day.  Since she did not feel much better she has been taking 25 mg 2-3 times a day but she states that she cannot really tell if her her vertigo or balance is any better.  She still feels very off-balance, feels like she will still fall over when she is not even moving.  Vertigo does not seem any worse.   She still feels somewhat nauseous with the vertigo however movements of her head and body and going from sitting to standing the balance, dizziness and lightheadedness has improved somewhat from last week.  He denies any visual changes, fever, neck pain, vomiting, syncope.  Her headache is also improved from last week however not resolved.  Despite the severity and number of symptoms she does continue to be fairly active, she has been going to other doctors appointments, she has been working at her church helping with a food drive, for to 8 hours a day.  She states that she overall feels more tired and worse, but her ability to do activity throughout the day sounds improved from where she was last week.  Last week she also could not open her eyes or move her head, today she is more alert, able to walk in the room without grabbing onto furniture, is able to look around without worsening headache or vertigo.  She notes that when taking some medicines for her headache one day while working at church for approximately 8 hours, the only thing she had to drink was some small sips of water to take her medicine and otherwise she did not drink anything else.  Normally drinks lots of water but has not been drinking very much lately. She denies any other new symptoms, no swelling around her ear, no redness or drainage, no shortness of breath, chest pain, palpitations, abdominal pain, vomiting, near syncope, orthopnea, PND, lower extremity edema.  She has unchanged seasonal allergies and nasal congestion, no medicines started to treat this.  She denies any urinary or vaginal symptoms but has not been urinating as frequently due to decreased p.o. fluids   Office visit 08/16/17 HPI  "[name] is a 67 y.o. female,  past medical history of cardiomyopathy, diastolic heart failure, uncontrolled diabetes, GERD, migraine, headaches, severe degenerative disc disease and chronic back pain, presents to clinic with chief  complaint of 3 weeks of dizziness with loss of balance.  Dizziness has been constant, described as "dizzy headed" she does not feel like room or objects around her are spinning but she does feel like at any moment she will lose her balance and fall over.  She also describes it as very unsteady she has to hold onto things to be able to walk which is new for her.  She does typically walk with a cane due to history of chronic back pain and severe arthritis of multiple joints, but she is never had an issue with her balance or coordination.  She denies any past history of stroke.  Dizziness is worsened with any movement of her body or head, also but it seems to worsen on its own such as when she is standing in the kitchen making dinner she feels like she is about to fall over even when she is not moving at all.  When going from sitting to standing she feels also lightheaded.  These worsening episodes and persistent vertigo are not associated with palpitations, chest pain, shortness of breath, diaphoresis.  She has had similar symptoms in the past but not as bad not as constant it was treated with a pill which made it feel better.  She feels like if she could just throw up she would feel much better.  Dizziness is associated with decreased hearing bilaterally but her hearing is worse in her right ear than in her left.  When her symptoms began 3 weeks ago she also complains of a severe headache which is described as a pressure, "pumped up head with air" and she feels like she just needs to "pop it and let it go down" headache is severe and constant.  She also complains also of neck pain but she cannot say if this is new or not she has chronic neck and back pain.  She denies fever, chills.  No new or worsening shortness of breath with exertion, lower extremity edema.  She is a poorly controlled diabetic and states that her sugars have been high 100s with her fasting morning sugars, 175-190, and around lunch they are in the  200s.  She denies any change to her appetite, urination, weight.  Patient had meclizine low-dose prescribed last year, September 2018.  She had spinning sensation but did not have similar balance issues that she is having currently."      Patient Active Problem List   Diagnosis Date Noted  . Ingrown toenail 08/22/2017  . Type II diabetes mellitus ([place])   . Sciatica   . PONV (postoperative nausea and vomiting)   . Peripheral neuropathy   . Osteoarthritis   . Migraines   . Irritable bowel syndrome   . Hypertension   . History of right mastectomy   . History of kidney stones   . H/O hiatal hernia   . GERD (gastroesophageal reflux disease)   . Family history of anesthesia complication   . Chronic lower back pain   . Breast cancer ([place])   . Arthritis   . Diabetes mellitus type 2, uncontrolled, with complications ([place])   . Lower extremity edema   . Positive D dimer 03/05/2017  . Elevated d-dimer   . Elevated troponin   . Chronic constipation 06/23/2015  . Obesity 06/12/2015  . Chronic diastolic heart failure ([place]) 11/17/2014  . HNP (herniated nucleus pulposus), lumbar 08/22/2014    Class: Chronic  . Herniated nucleus pulposus, L2-3 right 02/04/2014    Class: Acute  . Urinary retention 05/04/2012    Class: Temporary  . Spondylolisthesis of lumbar region 04/30/2012    Class: Chronic  . Lumbar stenosis with neurogenic claudication 04/30/2012    Class: Chronic  . Chest pain 03/20/2012  . Anginal pain ([place]) 03/19/2012  . Right shoulder pain 12/07/2010  . ABDOMINAL PAIN, LOWER 09/21/2009  . BACK PAIN 03/18/2009  . SYNCOPE-CAROTID SINUS 09/16/2008  . Essential hypertension 08/05/2008  . Hypothyroidism 08/23/2006  . Diabetes with neurologic complications ([place]) 16/01/9603  . Diabetic neuropathy ([place]) 08/23/2006  . CARDIOMYOPATHY 08/23/2006  . GERD 08/23/2006  . OSTEOARTHRITIS 08/23/2006  . DDD (degenerative disc disease), cervical 08/23/2006  . [place] DISEASE, LUMBAR  08/23/2006  . Headache(784.0) 08/23/2006  . BREAST CANCER, HX OF 08/23/2006  . HYPERPARATHYROIDISM, HX OF 08/23/2006  . RENAL CALCULUS, HX OF 08/23/2006     Prior to Admission medications   Medication Sig Start Date End Date Taking? Authorizing Provider  acetaminophen (TYLENOL) 500 MG tablet Take 500 mg by mouth every  4 (four) hours as needed for mild pain or headache.   Yes [provider]  Alcohol Swabs (B-D SINGLE USE SWABS REGULAR) PADS Use with insulin injections 05/17/17  Yes Susy Frizzle, MD  aspirin 81 MG tablet Take 81 mg by mouth daily.   Yes [provider]  Blood Glucose Calibration (TRUE METRIX LEVEL 2) Normal SOLN Use as directed 05/17/17  Yes Susy Frizzle, MD  Blood Glucose Monitoring Suppl (TRUE METRIX AIR GLUCOSE METER) w/Device KIT 1 kit by Does not apply route 3 (three) times daily. 05/10/17  Yes Susy Frizzle, MD  Calcium Carb-Cholecalciferol (CALTRATE 600+D3 SOFT) 600-800 MG-UNIT CHEW Chew 1 tablet by mouth 2 (two) times daily after a meal. 04/12/17  Yes Jessy Oto, MD  cephALEXin (KEFLEX) 500 MG capsule Take 1 capsule (500 mg total) by mouth 3 (three) times daily. 08/18/17  Yes Delsa Grana, PA-C  diclofenac (VOLTAREN) 75 MG EC tablet Take 1 tablet (75 mg total) by mouth 2 (two) times daily. 07/20/17  Yes Jessy Oto, MD  doxycycline (VIBRA-TABS) 100 MG tablet Take 1 tablet (100 mg total) by mouth 2 (two) times daily. 08/21/17  Yes [name], DPM  fluticasone (FLONASE) 50 MCG/ACT nasal spray Place 2 sprays into both nostrils daily. 05/10/17  Yes Susy Frizzle, MD  gabapentin (NEURONTIN) 300 MG capsule Take 1 capsule (300 mg total) by mouth 3 (three) times daily. 06/14/17  Yes Susy Frizzle, MD  glucose blood (TRUE METRIX BLOOD GLUCOSE TEST) test strip Use as instructed 05/10/17  Yes Susy Frizzle, MD  hydrochlorothiazide (MICROZIDE) 12.5 MG capsule Take 1 capsule (12.5 mg total) by mouth daily. 07/07/17  Yes Susy Frizzle,  MD  HYDROcodone-acetaminophen (NORCO/VICODIN) 5-325 MG tablet Take 1 tablet by mouth every 6 (six) hours as needed for moderate pain. 07/20/17  Yes Jessy Oto, MD  insulin NPH-regular Human (NOVOLIN 70/30 RELION) (70-30) 100 UNIT/ML injection 45 u qam & 25 u qpm Patient taking differently: 65 u qam & 30 u qpm 05/10/17  Yes Susy Frizzle, MD  levothyroxine (SYNTHROID, LEVOTHROID) 100 MCG tablet Take 1 tablet (100 mcg total) by mouth daily. 05/17/17  Yes Susy Frizzle, MD  losartan (COZAAR) 50 MG tablet Take 1 tablet (50 mg total) by mouth daily. 06/26/17  Yes Susy Frizzle, MD  meclizine (ANTIVERT) 12.5 MG tablet 1-2 tab po tid prn dizziness 08/18/17  Yes Delsa Grana, PA-C  metFORMIN (GLUCOPHAGE) 850 MG tablet Take 1 tablet (850 mg total) by mouth 2 (two) times daily with a meal. 09/21/16  Yes Pickard, Cammie Mcgee, MD  Misc Natural Products (LAXATIVE FORMULA) TABS Take 1 tablet by mouth as needed (for BM).   Yes [provider]  ranitidine (ZANTAC) 150 MG tablet Take 1 tablet (150 mg total) by mouth 2 (two) times daily. 05/18/17  Yes Susy Frizzle, MD  TRUEPLUS LANCETS 33G MISC Use as directed 05/10/17  Yes Susy Frizzle, MD     Allergies  Allergen Reactions  . Dilaudid [Hydromorphone Hcl] Nausea And Vomiting  . Lipitor [Atorvastatin] Other (See Comments)    Body & Muscle Aches  . Adhesive [Tape] Rash  . Ampicillin Rash    REACTION: Rash and welts on her hands when she took it in the hospital  . Latex Itching, Dermatitis and Rash    REACTION: Patient had a reaction to tape after surgery and they told her she was allergic to Latex 03/20/2012 "if it's on too long  it will break me out"  . Penicillins Rash     Family History  Problem Relation Age of Onset  . Hypertension Mother   . Diabetes Father   . Hypertension Father   . Hypertension Sister   . Hypertension Brother   . Hypertension Brother   . Diabetes Brother   . Diabetes Other        Family Hx. of Diabetes   . Hypertension Other        Family History of HTN  . Deep vein thrombosis Daughter      Social History   Socioeconomic History  . Marital status: Married    Spouse name: Not on file  . Number of children: 1  . Years of education: Not on file  . Highest education level: Not on file  Occupational History  . Occupation: Retired since 2003  Social Needs  . Financial resource strain: Not on file  . Food insecurity:    Worry: Not on file    Inability: Not on file  . Transportation needs:    Medical: Not on file    Non-medical: Not on file  Tobacco Use  . Smoking status: Never Smoker  . Smokeless tobacco: Never Used  Substance and Sexual Activity  . Alcohol use: No  . Drug use: No  . Sexual activity: Not on file  Lifestyle  . Physical activity:    Days per week: Not on file    Minutes per session: Not on file  . Stress: Not on file  Relationships  . Social connections:    Talks on phone: Not on file    Gets together: Not on file    Attends religious service: Not on file    Active member of club or organization: Not on file    Attends meetings of clubs or organizations: Not on file    Relationship status: Not on file  . Intimate partner violence:    Fear of current or ex partner: Not on file    Emotionally abused: Not on file    Physically abused: Not on file    Forced sexual activity: Not on file  Other Topics Concern  . Not on file  Social History Narrative  . Not on file     Review of Systems  Constitutional: Positive for fatigue. Negative for activity change, appetite change, chills, diaphoresis, fever and unexpected weight change.  HENT: Positive for congestion, ear pain, postnasal drip, rhinorrhea and sinus pressure. Negative for dental problem, drooling, ear discharge, facial swelling, mouth sores, nosebleeds, sinus pain, sneezing, sore throat, tinnitus, trouble swallowing and voice change.   Eyes: Negative.   Respiratory: Negative.   Cardiovascular:  Negative.   Gastrointestinal: Negative.   Endocrine: Negative.   Genitourinary: Positive for decreased urine volume. Negative for difficulty urinating, dyspareunia, dysuria, enuresis, flank pain, frequency, genital sores, hematuria, pelvic pain, urgency, vaginal bleeding, vaginal discharge and vaginal pain.  Musculoskeletal: Negative.   Skin: Negative.  Negative for color change, pallor and rash.  Neurological: Negative for tremors, syncope, facial asymmetry, speech difficulty, weakness, light-headedness and numbness.  Hematological: Negative.   Psychiatric/Behavioral: Negative.   All other systems reviewed and are negative.      Objective:    Vitals:   08/22/17 1005  BP: 140/78  Pulse: 65  Temp: 97.9 F (36.6 C)  TempSrc: Oral  SpO2: 94%  Weight: 206 lb 3.2 oz (93.5 kg)  Height: '5\' 5"'$  (1.651 m)      Physical Exam  Constitutional: She  is oriented to person, place, and time. She appears well-developed and well-nourished.  Non-toxic appearance. No distress.  Well-appearing female, appears stated age, no acute distress, seated in chair upright with eyes open  HENT:  Head: Normocephalic and atraumatic. Head is without right periorbital erythema and without left periorbital erythema.  Right Ear: Tympanic membrane, external ear and ear canal normal. No mastoid tenderness. Tympanic membrane is not injected and not scarred. No hemotympanum.  Left Ear: There is tenderness. No drainage. No mastoid tenderness. Decreased hearing is noted.  Mouth/Throat: Uvula is midline, oropharynx is clear and moist and mucous membranes are normal. No oropharyngeal exudate.  Left ear with soft yellow cerumen occluding most of canal and occluding TM, patient tragus and pinna tenderness to palpation, visible portions of external auditory canal erythematous and very mildly edematous Decreased hearing on the left  Eyes: Pupils are equal, round, and reactive to light. Conjunctivae, EOM and lids are normal.    Neck: Normal range of motion and phonation normal. Neck supple. No tracheal deviation present.  Cardiovascular: Normal rate, regular rhythm, normal heart sounds, intact distal pulses and normal pulses. Exam reveals no gallop and no friction rub.  No murmur heard. Pulses:      Radial pulses are 2+ on the right side, and 2+ on the left side.       Posterior tibial pulses are 2+ on the right side, and 2+ on the left side.  Nonpitting mild edema in bilateral lower extremities  Pulmonary/Chest: Effort normal and breath sounds normal. No stridor. No respiratory distress. She has no wheezes. She has no rhonchi. She has no rales. She exhibits no tenderness.  Abdominal: Soft. Normal appearance and bowel sounds are normal. She exhibits no distension and no mass. There is no tenderness. There is no guarding.  Musculoskeletal: Normal range of motion. She exhibits no edema or deformity.  Lymphadenopathy:    She has no cervical adenopathy.  Neurological: She is alert and oriented to person, place, and time. She has normal strength. No cranial nerve deficit or sensory deficit. She exhibits normal muscle tone. She displays a negative Romberg sign. Coordination and gait normal.  No pronator drift Negative Romberg, improved balance standing independently however still mild sway when doing Romberg Cranial nerves II through XII grossly intact, normal EOMs, no nystagmus, PERRLA Patient able to slowly ambulate across the room and pivot and return without loss of balance or holding onto furniture, of a slow and mildly antalgic gait secondary to chronic back pain   Skin: Skin is warm, dry and intact. Capillary refill takes less than 2 seconds. No rash noted. She is not diaphoretic. No pallor.  Psychiatric: She has a normal mood and affect. Her speech is normal and behavior is normal.  Nursing note and vitals reviewed.     Procedure: Cerumen Disimpaction  Gentle ear lavage with warm water and hydrogen peroxide  performed on the left ear. There were no complications and following the disimpaction the tympanic membrane was visible on the left. Tympanic membranes are intact following the procedure but erythematous and injected.  Auditory canals, left inflamed, erythematous and edematous, had pain with irrigation fluids.  After cerumen removal patient noted improved hearing on the left, decreased sensation of feeling blocked.  Pain from the procedure resolved.  Patient did not experience any acute vertigo episodes.   Cerumen was removed with irrigation and with curette with direct visualization About half of the left tympanic membrane was translucent with visible landmarks however no cone of  light, remaining area of tympanic membrane very erythematous continuing to external auditory canal which is very erythematous and edematous.       Assessment & Plan:      ICD-10-CM   1. Vertigo T26 COMPLETE METABOLIC PANEL WITH GFR    Urinalysis, Routine w reflex microscopic    Urine Culture    CBC with Differential  2. Otitis externa of left ear, unspecified chronicity, unspecified type H60.92 neomycin-polymyxin-hydrocortisone (CORTISPORIN) OTIC solution  3. Impacted cerumen of left ear H61.22   4. Allergic rhinitis, unspecified seasonality, unspecified trigger J30.9 fluticasone (FLONASE) 50 MCG/ACT nasal spray  5. Elevated BUN Z12.4 COMPLETE METABOLIC PANEL WITH GFR  6. Elevated hemoglobin ([facility]) D58.2 CBC with Differential  7. Abnormal urinalysis R82.90 Urinalysis, Routine w reflex microscopic    Urine Culture    Patient is a 67 year old female, complicated medical history, follow-up after ER visit and office visit last week for vertigo with loss of balance and severe headache.  ER work-up was pertinent for negative CT, MRA and MRI of head, brain and neck.  She was given meclizine trial, covered with Keflex, and encouraged to use Debrox drops and return to the office today for cerumen removal and follow-up.   Patient reports worsening ear pain, and unimproved other symptoms.  She does seem to be much less symptomatic than she was, she is able to open her eyes move her head and neck around without severe vertigo episodes, she is able to stand up and walk across the room and back without loss of balance, or pausing because of lightheadedness or dizziness.  Was able to successfully disimpact cerumen in her left, did appear to have otitis externa some minimally and inflamed and erythematous tympanic membrane, will cover with eardrops.  She continues to appear to have allergic rhinitis and seasonal allergies, has not been treating these, feel that this may be contributory towards all of her symptoms including headache, head pressure, dizziness, blocked ears.  We will start treatment with Claritin which she has at home, and Flonase.  Has been taking Flonase occasionally, instructed her how to take it, proper administration, encouraged to do daily for at least 2 weeks.  Patient is to continue with meclizine trial, while treating ear and allergies. I reviewed with her some of her normal lab values which may be from mild dehydration and hemoconcentration.  She is encouraged to drink increase clear, low sugar drinks for the next several days.  She did not wish to get her blood drawn today because she states she is just a hard stick that she did not want to be poked 8 times a day.  Encouraged her to drink low-calorie Gatorade just prior to coming, she believes she will come in for lab work tomorrow or the next day.  She had elevated BUN, 42, hope to see come back down to baseline with improved hydration. ER urinalysis was abnormal but it was not a very clean-catch.  Not run a culture on it.  She has been treated with antibiotics but it reordering UA and urine culture to rule out any possible UTI that would be contributing towards her loss of balance for 3 to 4 weeks.  Patient is to return in 1 to 2 weeks if her symptoms do not  significantly improved.  We have discussed returning to neurology or going back to ENT for more specialized work-up.  At this time her headache and vertigo seem improved and she continues to have urine no symptoms so I did  discuss with her that I would probably like her to go back to ENT.    Delsa Grana, PA-C 08/22/17 11:03 AM

## 2017-08-22 NOTE — Patient Instructions (Addendum)
For dizziness - continue Meclizine as needed.   For left ear - do antibiotic/steroid drops for 5-7 days   For allergies  - start taking claritin daily, and add flonase daily during spring to summer.    This will help with headaches, ear symptoms and dizziness.     Follow up if your dizziness does not improve in the next week.    Return to do repeat labs after drinking more fluids daily - clear fluids low in sugar.  Orders Placed This Encounter  Procedures  . Urine Culture  . COMPLETE METABOLIC PANEL WITH GFR    Standing Status:   Future    Standing Expiration Date:   08/23/2018  . Urinalysis, Routine w reflex microscopic  . CBC with Differential     Meds ordered this encounter  Medications  . neomycin-polymyxin-hydrocortisone (CORTISPORIN) OTIC solution    Sig: Place 4 drops into the left ear 4 (four) times daily for 7 days.    Dispense:  10 mL    Refill:  0    Order Specific Question:   Supervising Provider    Answer:   Alycia Rossetti [[id number]]  . fluticasone (FLONASE) 50 MCG/ACT nasal spray    Sig: Place 2 sprays into both nostrils daily.    Dispense:  16 g    Refill:  6    Order Specific Question:   Supervising Provider    Answer:   Alycia Rossetti [[id number]]      Allergic Rhinitis, Adult Allergic rhinitis is an allergic reaction that affects the mucous membrane inside the nose. It causes sneezing, a runny or stuffy nose, and the feeling of mucus going down the back of the throat (postnasal drip). Allergic rhinitis can be mild to severe. There are two types of allergic rhinitis:  Seasonal. This type is also called hay fever. It happens only during certain seasons.  Perennial. This type can happen at any time of the year.  What are the causes? This condition happens when the body's defense system (immune system) responds to certain harmless substances called allergens as though they were germs.  Seasonal allergic rhinitis is triggered by pollen, which can come  from grasses, trees, and weeds. Perennial allergic rhinitis may be caused by:  House dust mites.  Pet dander.  Mold spores.  What are the signs or symptoms? Symptoms of this condition include:  Sneezing.  Runny or stuffy nose (nasal congestion).  Postnasal drip.  Itchy nose.  Tearing of the eyes.  Trouble sleeping.  Daytime sleepiness.  How is this diagnosed? This condition may be diagnosed based on:  Your medical history.  A physical exam.  Tests to check for related conditions, such as: ? Asthma. ? Pink eye. ? Ear infection. ? Upper respiratory infection.  Tests to find out which allergens trigger your symptoms. These may include skin or blood tests.  How is this treated? There is no cure for this condition, but treatment can help control symptoms. Treatment may include:  Taking medicines that block allergy symptoms, such as antihistamines. Medicine may be given as a shot, nasal spray, or pill.  Avoiding the allergen.  Desensitization. This treatment involves getting ongoing shots until your body becomes less sensitive to the allergen. This treatment may be done if other treatments do not help.  If taking medicine and avoiding the allergen does not work, new, stronger medicines may be prescribed.  Follow these instructions at home:  Find out what you are allergic to. Common allergens include  smoke, dust, and pollen.  Avoid the things you are allergic to. These are some things you can do to help avoid allergens: ? Replace carpet with wood, tile, or vinyl flooring. Carpet can trap dander and dust. ? Do not smoke. Do not allow smoking in your home. ? Change your heating and air conditioning filter at least once a month. ? During allergy season:  Keep windows closed as much as possible.  Plan outdoor activities when pollen counts are lowest. This is usually during the evening hours.  When coming indoors, change clothing and shower before sitting on  furniture or bedding.  Take over-the-counter and prescription medicines only as told by your health care provider.  Keep all follow-up visits as told by your health care provider. This is important. Contact a health care provider if:  You have a fever.  You develop a persistent cough.  You make whistling sounds when you breathe (you wheeze).  Your symptoms interfere with your normal daily activities. Get help right away if:  You have shortness of breath. Summary  This condition can be managed by taking medicines as directed and avoiding allergens.  Contact your health care provider if you develop a persistent cough or fever.  During allergy season, keep windows closed as much as possible. This information is not intended to replace advice given to you by your health care provider. Make sure you discuss any questions you have with your health care provider. Document Released: 12/21/2000 Document Revised: 05/05/2016 Document Reviewed: 05/05/2016 Elsevier Interactive Patient Education  2018 [employer].    Eustachian Tube Dysfunction The eustachian tube connects the middle ear to the back of the nose. It regulates air pressure in the middle ear by allowing air to move between the ear and nose. It also helps to drain fluid from the middle ear space. When the eustachian tube does not function properly, air pressure, fluid, or both can build up in the middle ear. Eustachian tube dysfunction can affect one or both ears. What are the causes? This condition happens when the eustachian tube becomes blocked or cannot open normally. This may result from:  Ear infections.  Colds and other upper respiratory infections.  Allergies.  Irritation, such as from cigarette smoke or acid from the stomach coming up into the esophagus (gastroesophageal reflux).  Sudden changes in air pressure, such as from descending in an airplane.  Abnormal growths in the nose or throat, such as nasal  polyps, tumors, or enlarged tissue at the back of the throat (adenoids).  What increases the risk? This condition may be more likely to develop in people who smoke and people who are overweight. Eustachian tube dysfunction may also be more likely to develop in children, especially children who have:  Certain birth defects of the mouth, such as cleft palate.  Large tonsils and adenoids.  What are the signs or symptoms? Symptoms of this condition may include:  A feeling of fullness in the ear.  Ear pain.  Clicking or popping noises in the ear.  Ringing in the ear.  Hearing loss.  Loss of balance.  Symptoms may get worse when the air pressure around you changes, such as when you travel to an area of high elevation or fly on an airplane. How is this diagnosed? This condition may be diagnosed based on:  Your symptoms.  A physical exam of your ear, nose, and throat.  Tests, such as those that measure: ? The movement of your eardrum (tympanogram). ? Your hearing (  audiometry).  How is this treated? Treatment depends on the cause and severity of your condition. If your symptoms are mild, you may be able to relieve your symptoms by moving air into ("popping") your ears. If you have symptoms of fluid in your ears, treatment may include:  Decongestants.  Antihistamines.  Nasal sprays or ear drops that contain medicines that reduce swelling (steroids).  In some cases, you may need to have a procedure to drain the fluid in your eardrum (myringotomy). In this procedure, a small tube is placed in the eardrum to:  Drain the fluid.  Restore the air in the middle ear space.  Follow these instructions at home:  Take over-the-counter and prescription medicines only as told by your health care provider.  Use techniques to help pop your ears as recommended by your health care provider. These may include: ? Chewing gum. ? Yawning. ? Frequent, forceful swallowing. ? Closing your  mouth, holding your nose closed, and gently blowing as if you are trying to blow air out of your nose.  Do not do any of the following until your health care provider approves: ? Travel to high altitudes. ? Fly in airplanes. ? Work in a Pension scheme manager or room. ? Scuba dive.  Keep your ears dry. Dry your ears completely after showering or bathing.  Do not smoke.  Keep all follow-up visits as told by your health care provider. This is important. Contact a health care provider if:  Your symptoms do not go away after treatment.  Your symptoms come back after treatment.  You are unable to pop your ears.  You have: ? A fever. ? Pain in your ear. ? Pain in your head or neck. ? Fluid draining from your ear.  Your hearing suddenly changes.  You become very dizzy.  You lose your balance. This information is not intended to replace advice given to you by your health care provider. Make sure you discuss any questions you have with your health care provider. Document Released: 04/24/2015 Document Revised: 09/03/2015 Document Reviewed: 04/16/2014 Elsevier Interactive Patient Education  2018 [place], Adult The ears produce a substance called earwax that helps keep bacteria out of the ear and protects the skin in the ear canal. Occasionally, earwax can build up in the ear and cause discomfort or hearing loss. What increases the risk? This condition is more likely to develop in people who:  Are female.  Are elderly.  Naturally produce more earwax.  Clean their ears often with cotton swabs.  Use earplugs often.  Use in-ear headphones often.  Wear hearing aids.  Have narrow ear canals.  Have earwax that is overly thick or sticky.  Have eczema.  Are dehydrated.  Have excess hair in the ear canal.  What are the signs or symptoms? Symptoms of this condition include:  Reduced or muffled hearing.  A feeling of fullness in the ear or feeling that  the ear is plugged.  Fluid coming from the ear.  Ear pain.  Ear itch.  Ringing in the ear.  Coughing.  An obvious piece of earwax that can be seen inside the ear canal.  How is this diagnosed? This condition may be diagnosed based on:  Your symptoms.  Your medical history.  An ear exam. During the exam, your health care provider will look into your ear with an instrument called an otoscope.  You may have tests, including a hearing test. How is this treated? This condition may be  treated by:  Using ear drops to soften the earwax.  Having the earwax removed by a health care provider. The health care provider may: ? Flush the ear with water. ? Use an instrument that has a loop on the end (curette). ? Use a suction device.  Surgery to remove the wax buildup. This may be done in severe cases.  Follow these instructions at home:  Take over-the-counter and prescription medicines only as told by your health care provider.  Do not put any objects, including cotton swabs, into your ear. You can clean the opening of your ear canal with a washcloth or facial tissue.  Follow instructions from your health care provider about cleaning your ears. Do not over-clean your ears.  Drink enough fluid to keep your urine clear or pale yellow. This will help to thin the earwax.  Keep all follow-up visits as told by your health care provider. If earwax builds up in your ears often or if you use hearing aids, consider seeing your health care provider for routine, preventive ear cleanings. Ask your health care provider how often you should schedule your cleanings.  If you have hearing aids, clean them according to instructions from the manufacturer and your health care provider. Contact a health care provider if:  You have ear pain.  You develop a fever.  You have blood, pus, or other fluid coming from your ear.  You have hearing loss.  You have ringing in your ears that does not go  away.  Your symptoms do not improve with treatment.  You feel like the room is spinning (vertigo). Summary  Earwax can build up in the ear and cause discomfort or hearing loss.  The most common symptoms of this condition include reduced or muffled hearing and a feeling of fullness in the ear or feeling that the ear is plugged.  This condition may be diagnosed based on your symptoms, your medical history, and an ear exam.  This condition may be treated by using ear drops to soften the earwax or by having the earwax removed by a health care provider.  Do not put any objects, including cotton swabs, into your ear. You can clean the opening of your ear canal with a washcloth or facial tissue. This information is not intended to replace advice given to you by your health care provider. Make sure you discuss any questions you have with your health care provider. Document Released: 05/05/2004 Document Revised: 06/08/2016 Document Reviewed: 06/08/2016 Elsevier Interactive Patient Education  [employer].

## 2017-08-23 DIAGNOSIS — Z853 Personal history of malignant neoplasm of breast: Secondary | ICD-10-CM | POA: Diagnosis not present

## 2017-08-23 DIAGNOSIS — Z1231 Encounter for screening mammogram for malignant neoplasm of breast: Secondary | ICD-10-CM | POA: Diagnosis not present

## 2017-08-23 LAB — URINE CULTURE
MICRO NUMBER: 90586180
RESULT: NO GROWTH
SPECIMEN QUALITY:: ADEQUATE

## 2017-08-23 LAB — HM MAMMOGRAPHY

## 2017-08-24 NOTE — Progress Notes (Signed)
Urine culture is negative.   She should be finished with the antibiotics and does not need any thing else at this time.  She does need to continue to drink more fluids throughout the day.

## 2017-08-29 ENCOUNTER — Ambulatory Visit (INDEPENDENT_AMBULATORY_CARE_PROVIDER_SITE_OTHER): Payer: Medicare HMO | Admitting: Podiatry

## 2017-08-29 ENCOUNTER — Other Ambulatory Visit: Payer: Self-pay | Admitting: Endocrinology

## 2017-08-29 ENCOUNTER — Encounter: Payer: Self-pay | Admitting: Podiatry

## 2017-08-29 DIAGNOSIS — Z9889 Other specified postprocedural states: Secondary | ICD-10-CM

## 2017-08-29 DIAGNOSIS — L600 Ingrowing nail: Secondary | ICD-10-CM

## 2017-08-29 NOTE — Patient Instructions (Signed)

## 2017-08-30 NOTE — Progress Notes (Signed)
Subjective: [name] is a 67 y.o.  female returns to office today for follow up evaluation after having LEFT Hallux medial partial permanent nail avulsion performed. Patient has been soaking using epsom salts and applying topical antibiotic covered with bandaid daily. Patient denies fevers, chills, nausea, vomiting. Denies any calf pain, chest pain, SOB.   Objective:  Vitals: Reviewed  General: Well developed, nourished, in no acute distress, alert and oriented x3   Dermatology: Skin is warm, dry and supple bilateral. LEFT hallux nail border appears to be clean, dry, with mild granular tissue and surrounding scab. There is no surrounding erythema, edema, drainage/purulence. The remaining nails appear unremarkable at this time. There are no other lesions or other signs of infection present.  Neurovascular status: Intact. No lower extremity swelling; No pain with calf compression bilateral.  Musculoskeletal: Decreased tenderness to palpation of the medial hallux nail fold. Muscular strength within normal limits bilateral.   Assesement and Plan: S/p partial nail avulsion, doing well.   -Continue soaking in epsom salts twice a day followed by antibiotic ointment and a band-aid. Can leave uncovered at night. Continue this until completely healed.  -If the area has not healed in 2 weeks, call the office for follow-up appointment, or sooner if any problems arise.  -Monitor for any signs/symptoms of infection. Call the office immediately if any occur or go directly to the emergency room. Call with any questions/concerns.  Celesta Gentile, DPM

## 2017-08-31 ENCOUNTER — Ambulatory Visit (INDEPENDENT_AMBULATORY_CARE_PROVIDER_SITE_OTHER): Payer: Medicare HMO | Admitting: Specialist

## 2017-09-01 ENCOUNTER — Encounter: Payer: Self-pay | Admitting: *Deleted

## 2017-09-07 ENCOUNTER — Encounter (INDEPENDENT_AMBULATORY_CARE_PROVIDER_SITE_OTHER): Payer: Self-pay | Admitting: *Deleted

## 2017-09-21 ENCOUNTER — Other Ambulatory Visit: Payer: Self-pay | Admitting: Family Medicine

## 2017-09-26 ENCOUNTER — Ambulatory Visit: Payer: Medicare HMO

## 2017-09-28 ENCOUNTER — Telehealth (INDEPENDENT_AMBULATORY_CARE_PROVIDER_SITE_OTHER): Payer: Self-pay | Admitting: Radiology

## 2017-09-28 ENCOUNTER — Ambulatory Visit: Payer: Medicare HMO | Attending: Specialist | Admitting: Physical Therapy

## 2017-09-28 ENCOUNTER — Ambulatory Visit: Payer: Medicare HMO | Admitting: Physical Therapy

## 2017-09-28 ENCOUNTER — Other Ambulatory Visit (INDEPENDENT_AMBULATORY_CARE_PROVIDER_SITE_OTHER): Payer: Self-pay | Admitting: *Deleted

## 2017-09-28 DIAGNOSIS — M542 Cervicalgia: Secondary | ICD-10-CM | POA: Insufficient documentation

## 2017-09-28 DIAGNOSIS — M545 Low back pain, unspecified: Secondary | ICD-10-CM

## 2017-09-28 DIAGNOSIS — M5386 Other specified dorsopathies, lumbar region: Secondary | ICD-10-CM | POA: Insufficient documentation

## 2017-09-28 DIAGNOSIS — R293 Abnormal posture: Secondary | ICD-10-CM | POA: Insufficient documentation

## 2017-09-28 DIAGNOSIS — G8929 Other chronic pain: Secondary | ICD-10-CM | POA: Insufficient documentation

## 2017-09-28 DIAGNOSIS — M6281 Muscle weakness (generalized): Secondary | ICD-10-CM | POA: Insufficient documentation

## 2017-09-28 NOTE — Therapy (Signed)
[facility] [place], [place], [address] Phone: [phone]   Fax:  [phone]  Patient Details  Name: [name] MRN: [id number] Date of Birth: November 10, 1950 Referring Provider:  Jessy Oto, MD  Encounter Date: 09/28/2017  Patient arrived almost 20 minutes late, stating "I was waiting out the storm". When she was taken back to exam room, PT discussed limited time today and offered options of doing partial evaluation today/other part next session verus completely rescheduling; patient chose to start evaluation process today.   She reports that it is her low back bothering her and for which she was hoping to be seen for at physical therapy. This PT was only able to find MD referral for cervical region in Epic; asked front desk staff to double check, and they were also only able to locate MD referral for cervical spine.   Patient declines PT for her neck as she is more concerned with her back. Education provided that PT cannot see her without appropriate MD referral for lumbar spine, and patient was advised to call her MD and reschedule PT evaluation as able.   Deniece Ree PT, DPT, [place]  Supplemental Physical Therapist [place]   Pager [place] [facility] [address] [place], [place], [address] Phone: [phone]   Fax:  [phone]

## 2017-09-28 NOTE — Telephone Encounter (Signed)
Patient wants to know if she should be doing PT for her back also,  She states that she has been going for her neck.  Please advise.

## 2017-09-29 ENCOUNTER — Other Ambulatory Visit: Payer: Self-pay

## 2017-09-29 ENCOUNTER — Ambulatory Visit: Payer: Medicare HMO | Admitting: Physical Therapy

## 2017-09-29 ENCOUNTER — Encounter: Payer: Self-pay | Admitting: Physical Therapy

## 2017-09-29 DIAGNOSIS — R293 Abnormal posture: Secondary | ICD-10-CM | POA: Diagnosis not present

## 2017-09-29 DIAGNOSIS — G8929 Other chronic pain: Secondary | ICD-10-CM | POA: Diagnosis not present

## 2017-09-29 DIAGNOSIS — M6281 Muscle weakness (generalized): Secondary | ICD-10-CM | POA: Diagnosis not present

## 2017-09-29 DIAGNOSIS — M5386 Other specified dorsopathies, lumbar region: Secondary | ICD-10-CM | POA: Diagnosis not present

## 2017-09-29 DIAGNOSIS — M545 Low back pain, unspecified: Secondary | ICD-10-CM

## 2017-09-29 DIAGNOSIS — M542 Cervicalgia: Secondary | ICD-10-CM | POA: Diagnosis not present

## 2017-09-29 NOTE — Therapy (Addendum)
[place], [place], [address] Phone: [phone]   Fax:  [phone]  Physical Therapy Evaluation  Patient Details  Name: [name] MRN: [id number] Date of Birth: Apr 11, 1951 Referring Provider: Jessy Oto, MD   Encounter Date: 09/29/2017  PT End of Session - 09/29/17 0858    Visit Number  1    Number of Visits  13    Date for PT Re-Evaluation  11/10/17    PT Start Time  6063    PT Stop Time  0935    PT Time Calculation (min)  48 min    Activity Tolerance  Patient tolerated treatment well    Behavior During Therapy  [facility] for tasks assessed/performed       Past Medical History:  Diagnosis Date  . Anginal pain ([place]) 03/19/2012   saw Dr. Cathie Olden .. she thinks its more related to stomach issues  . Arthritis    "all over; mostly in my back" (03/20/2012)  . Breast cancer ([place])    "right" (03/20/2012)  . Cardiomyopathy   . Chest pain 06/2005   Hospitalized, dystolic dysfunction  . Chronic lower back pain    "have 2 herniated discs; going to have to have a fusion" (03/20/2012)  . Family history of anesthesia complication    "father has nausea and vomiting"  . GERD (gastroesophageal reflux disease)   . H/O hiatal hernia   . History of kidney stones   . History of right mastectomy   . Hypertension    "patient states never had HTN, takes Hyzaar for heart  . Hypothyroidism   . Irritable bowel syndrome   . Migraines    "it's been a long time since I've had one" (03/20/2012)  . Osteoarthritis   . Peripheral neuropathy   . PONV (postoperative nausea and vomiting)   . Sciatica   . Type II diabetes mellitus ([place])     Past Surgical History:  Procedure Laterality Date  . ABDOMINAL HYSTERECTOMY  1993  . adenosin cardiolite  07/2005   (+) wall motion abnormality  . AXILLARY LYMPH NODE DISSECTION  2003   squamous cell cancer  . BACK SURGERY    . CARDIAC CATHETERIZATION  ? 2005 / 2007  . CT  abdomen and pelvis  02/2010   Same  . ESOPHAGOGASTRODUODENOSCOPY  2005   GERD  . KNEE ARTHROSCOPY  1990's   "right" (03/20/2012)  . LUMBAR FUSION N/A 08/22/2014   Procedure: Right L2-3 and right L1-2 transforaminal lumbar interbody fusion with pedicle screws, rods, sleeves, cages, local bone graft, Vivigen, cancellous chips;  Surgeon: Jessy Oto, MD;  Location: [place];  Service: Orthopedics;  Laterality: N/A;  . LUMBAR LAMINECTOMY/DECOMPRESSION MICRODISCECTOMY N/A 02/04/2014   Procedure: RIGHT L2-3 MICRODISCECTOMY ;  Surgeon: Jessy Oto, MD;  Location: [place];  Service: Orthopedics;  Laterality: N/A;  . MASTECTOMY WITH AXILLARY LYMPH NODE DISSECTION  12/2003   "right" (03/20/2012)  . POSTERIOR CERVICAL FUSION/FORAMINOTOMY  2001  . SHOULDER ARTHROSCOPY W/ ROTATOR CUFF REPAIR  2003   Left  . THYROIDECTOMY  1977   Right  . TOTAL KNEE ARTHROPLASTY  2000   Right  . [place] of abdomen  07/2005   Fatty liver / kidney stones    There were no vitals filed for this visit.   Subjective Assessment - 09/29/17 0903    Subjective  Pt is here for low back pain that has been persisting since her last surgery (2016 or 2017). Physician  is talking about her having another surgery. Pain stretches across her low back. She reports occasional sudden bouts of weakness in her legs when ascending/descending stairs and standing up from chair. Pt is somewhat incontinent.     Limitations  Standing;Walking;Lifting;House hold activities    How long can you sit comfortably?  no limit    How long can you stand comfortably?  approximately 10 min    How long can you walk comfortably?  5-10 minutes    Diagnostic tests  x-ray and MRI    Patient Stated Goals  be able to stand long enough cook a meal, crochet    Currently in Pain?  Yes    Pain Score  4     Pain Location  Back    Pain Orientation  Lower    Pain Descriptors / Indicators  Aching    Pain Type  Chronic pain    Pain Radiating Towards  Radiates into RLE all  the way to foot    Pain Onset  More than a month ago    Pain Frequency  Constant    Aggravating Factors   standing, walking    Pain Relieving Factors  sitting, laying down    Effect of Pain on Daily Activities  cannot perform household activities         [facility] PT Assessment - 09/29/17 0001      Assessment   Medical Diagnosis  spondylosis    Referring Provider  Jessy Oto, MD    Onset Date/Surgical Date  -- 2016 or 2017 (last surgery)    Hand Dominance  Right    Next MD Visit  -- has not made appointment    Prior Therapy  yes      Precautions   Precautions  -- not lifting above 20-25 lbs, no lifting overhead, no vacuum      Balance Screen   Has the patient fallen in the past 6 months  No    Has the patient had a decrease in activity level because of a fear of falling?   No    Is the patient reluctant to leave their home because of a fear of falling?   No      Home Film/video editor residence    Living Arrangements  Spouse/significant other    Available Help at Discharge  Family    Type of [place] to enter    Entrance Stairs-Number of Steps  [place]  One level    [place] - 2 wheels;Cane - single point;Cane - quad;Grab bars - tub/shower      Prior Function   Level of Independence  Needs assistance with ADLs;Needs assistance with homemaking    Meal Prep  Moderate    Vacuuming  Moderate      Cognition   Overall Cognitive Status  Within Functional Limits for tasks assessed      Observation/Other Assessments   Focus on Therapeutic Outcomes (FOTO)   60% limited 52% limited      Posture/Postural Control   Posture/Postural Control  Postural limitations    Postural Limitations  Rounded Shoulders;Forward head;Flexed trunk;Decreased lumbar lordosis      ROM / Strength   AROM / PROM / Strength  AROM;Strength      AROM   Lumbar Flexion  40    Lumbar Extension  3     Lumbar - Right Side Bend  8    Lumbar - Left Side Bend  10      Strength   Right Hip Flexion  4-/5    Left Hip Flexion  4-/5      Palpation   Palpation comment  Pt tender to palpation over lumbar SPs      Special Tests    Special Tests  Hip Special Tests      Thomas Test    Findings  Positive    Side  Right;Left    Comments  pt could not tolerate on R compared to L      Ambulation/Gait   Ambulation/Gait  Yes    Gait Pattern  Antalgic;Step-through pattern;Trendelenburg Pt wall-walking when entering gym. Very guarded gait pattern                Objective measurements completed on examination: See above findings.              PT Education - 09/29/17 0939    Education Details  examination findings, POC, HEP    Person(s) Educated  Patient    Methods  Explanation;Handout    Comprehension  Verbalized understanding       PT Short Term Goals - 09/29/17 0959      PT SHORT TERM GOAL #1   Title  Pt will be I with HEP    Time  3    Period  Weeks    Status  New    Target Date  10/20/17      PT SHORT TERM GOAL #2   Title  Pt will be able to demonstrate and verbalize proper posture in sitting and standing to decrease low back pain to </= 2/10    Time  3    Period  Weeks    Status  New    Target Date  10/20/17      PT SHORT TERM GOAL #3   Title  Pt will verbalize pain control mechanisms to utilize for low back pain to improve quality of life    Time  3    Period  Weeks    Status  New    Target Date  10/20/17        PT Long Term Goals - 09/29/17 1005      PT LONG TERM GOAL #1   Title  Pt will be I with HEP to ensure progress in physical therapy    Time  6    Period  Weeks    Status  New    Target Date  11/10/17      PT LONG TERM GOAL #2   Title  Pt will increase lumbar flexion ROM to >/= 60 degrees, lumbar extension >/= 20 degrees, lumbar sidebending >/= 20 degrees    Time  6    Period  Weeks    Status  New    Target Date  11/10/17       PT LONG TERM GOAL #3   Title  Pt will increase hip strength to >/= 4+/5 to decrease pain and improve functional mobility in the home and community.     Time  6    Period  Weeks    Status  New    Target Date  11/10/17      PT LONG TERM GOAL #4   Title  Pt will be able to stand for >/= 30 minutes with </= 2/10 pain to be able  to stand in kitchen and cook meals.    Time  6    Period  Weeks    Status  New    Target Date  11/10/17      PT LONG TERM GOAL #5   Title  Pt will increase her FOTO score </= 52% limited to improve functional mobility and quality of life    Time  6    Period  Weeks    Status  New    Target Date  11/10/17             Plan - 09/29/17 0940    Clinical Impression Statement  Pt is a 67 yo female who presents to [place] [facility] with diagnosis of lumbar spondylosis per physician referral. Her low back pain has been persisting since her last surgery in 2016 (lumbar fusion). Upon examinatoin, she demonstrates significant limitations in lumbar ROM and hip flexor flexibility. She also presents with decreased lumbar lordosis and thoracic kyphosis. Her physician has directed her to not lift >20-25 lbs and no lifting overhead. Pt would benefit from OPPT services to address ROM limitations, strength deficits, pain, and functional mobility.     History and Personal Factors relevant to plan of care:  multiple comorbidities, age, lumbar fusion surgery    Clinical Presentation  Evolving    Clinical Presentation due to:  increased pain, decreased ROM, muscle spasm    Clinical Decision Making  Moderate    Rehab Potential  Good    PT Frequency  2x / week    PT Duration  6 weeks    PT Treatment/Interventions  ADLs/Self Care Home Management;Electrical Stimulation;Traction;Moist Heat;Ultrasound;DME Instruction;Gait training;Stair training;Functional mobility training;Therapeutic activities;Therapeutic exercise;Balance training;Neuromuscular re-education;Patient/family education;Manual  techniques;Passive range of motion;Dry needling;Energy conservation;Taping    PT Next Visit Plan  Review/update HEP, finish assessing hip strength, assess SI joint (pt reporting pain in sacrum), glute strengthening, core strengthening and activation    PT Home Exercise Plan  lower trunk rotations, pelvic tilts, hip flexor stretch, glute sets    Consulted and Agree with Plan of Care  Patient       Patient will benefit from skilled therapeutic intervention in order to improve the following deficits and impairments:  Abnormal gait, Pain, Decreased mobility, Postural dysfunction, Decreased activity tolerance, Decreased endurance, Decreased range of motion, Decreased strength, Decreased balance, Difficulty walking, Impaired flexibility, Hypomobility  Visit Diagnosis: Chronic midline low back pain without sciatica  Decreased ROM of lumbar spine  Muscle weakness (generalized)  Abnormal posture     Problem List Patient Active Problem List   Diagnosis Date Noted  . Ingrown toenail 08/22/2017  . Type II diabetes mellitus ([place])   . Sciatica   . PONV (postoperative nausea and vomiting)   . Peripheral neuropathy   . Osteoarthritis   . Migraines   . Irritable bowel syndrome   . Hypertension   . History of right mastectomy   . History of kidney stones   . H/O hiatal hernia   . GERD (gastroesophageal reflux disease)   . Family history of anesthesia complication   . Chronic lower back pain   . Breast cancer ([place])   . Arthritis   . Diabetes mellitus type 2, uncontrolled, with complications ([place])   . Lower extremity edema   . Positive D dimer 03/05/2017  . Elevated d-dimer   . Elevated troponin   . Chronic constipation 06/23/2015  . Obesity 06/12/2015  . Chronic diastolic heart failure ([place]) 11/17/2014  . HNP (herniated nucleus pulposus), lumbar  08/22/2014    Class: Chronic  . Herniated nucleus pulposus, L2-3 right 02/04/2014    Class: Acute  . Urinary retention 05/04/2012     Class: Temporary  . Spondylolisthesis of lumbar region 04/30/2012    Class: Chronic  . Lumbar stenosis with neurogenic claudication 04/30/2012    Class: Chronic  . Chest pain 03/20/2012  . Anginal pain ([place]) 03/19/2012  . Right shoulder pain 12/07/2010  . ABDOMINAL PAIN, LOWER 09/21/2009  . BACK PAIN 03/18/2009  . SYNCOPE-CAROTID SINUS 09/16/2008  . Essential hypertension 08/05/2008  . Hypothyroidism 08/23/2006  . Diabetes with neurologic complications ([place]) 01/30/1172  . Diabetic neuropathy ([place]) 08/23/2006  . CARDIOMYOPATHY 08/23/2006  . GERD 08/23/2006  . OSTEOARTHRITIS 08/23/2006  . DDD (degenerative disc disease), cervical 08/23/2006  . [place] DISEASE, LUMBAR 08/23/2006  . Headache(784.0) 08/23/2006  . BREAST CANCER, HX OF 08/23/2006  . HYPERPARATHYROIDISM, HX OF 08/23/2006  . RENAL CALCULUS, HX OF 08/23/2006   Worthy Flank, SPT 09/29/17 1:12 PM   [facility] [address] [place], [place], [address] Phone: [phone]   Fax:  [phone]  Name: DESHANTI ADCOX MRN: [id number] Date of Birth: Feb 07, 1951

## 2017-10-06 ENCOUNTER — Ambulatory Visit (INDEPENDENT_AMBULATORY_CARE_PROVIDER_SITE_OTHER): Payer: Medicare HMO | Admitting: Podiatry

## 2017-10-06 ENCOUNTER — Encounter: Payer: Self-pay | Admitting: Podiatry

## 2017-10-06 DIAGNOSIS — M79676 Pain in unspecified toe(s): Secondary | ICD-10-CM | POA: Diagnosis not present

## 2017-10-06 DIAGNOSIS — E0843 Diabetes mellitus due to underlying condition with diabetic autonomic (poly)neuropathy: Secondary | ICD-10-CM | POA: Diagnosis not present

## 2017-10-06 DIAGNOSIS — B351 Tinea unguium: Secondary | ICD-10-CM

## 2017-10-07 NOTE — Progress Notes (Signed)
Subjective: 67 y.o. returns the office today for painful, elongated, thickened toenails which she cannot trim herself.  She states that the left hallux nail is doing well and has no pain since I last saw her denies any drainage or pus.  Denies any redness or drainage around the nails. Denies any acute changes since last appointment and no new complaints today. Denies any systemic complaints such as fevers, chills, nausea, vomiting.   PCP: Susy Frizzle, MD  Objective: AAO 3, NAD DP/PT pulses palpable, CRT less than 3 seconds Nails hypertrophic, dystrophic, elongated, brittle, discolored 10. There is tenderness overlying the nails 1-5 bilaterally. There is no surrounding erythema or drainage along the nail sites.  There was some dried blood in the left hallux toenail removal procedure but there is no bleeding identified today no open sores or signs of infection. No open lesions or pre-ulcerative lesions are identified. No other areas of tenderness bilateral lower extremities. No overlying edema, erythema, increased warmth. No pain with calf compression, swelling, warmth, erythema.  Assessment: Patient presents with symptomatic onychomycosis  Plan: -Treatment options including alternatives, risks, complications were discussed -Nails sharply debrided 10 without complication/bleeding. -Discussed daily foot inspection. If there are any changes, to call the office immediately.  -Follow-up in 3 months or sooner if any problems are to arise. In the meantime, encouraged to call the office with any questions, concerns, changes symptoms.  Celesta Gentile, DPM

## 2017-10-10 ENCOUNTER — Ambulatory Visit: Payer: Medicare HMO | Attending: Specialist | Admitting: Physical Therapy

## 2017-10-10 ENCOUNTER — Encounter: Payer: Self-pay | Admitting: Physical Therapy

## 2017-10-10 DIAGNOSIS — M5386 Other specified dorsopathies, lumbar region: Secondary | ICD-10-CM | POA: Diagnosis not present

## 2017-10-10 DIAGNOSIS — R293 Abnormal posture: Secondary | ICD-10-CM

## 2017-10-10 DIAGNOSIS — M6281 Muscle weakness (generalized): Secondary | ICD-10-CM

## 2017-10-10 DIAGNOSIS — G8929 Other chronic pain: Secondary | ICD-10-CM

## 2017-10-10 DIAGNOSIS — M5441 Lumbago with sciatica, right side: Secondary | ICD-10-CM | POA: Insufficient documentation

## 2017-10-10 DIAGNOSIS — M545 Low back pain, unspecified: Secondary | ICD-10-CM

## 2017-10-10 NOTE — Therapy (Signed)
[place], [place], [address] Phone: [phone]   Fax:  [phone]  Physical Therapy Treatment  Patient Details  Name: [name] MRN: [id number] Date of Birth: 03-31-1951 Referring Provider: Jessy Oto, MD   Encounter Date: 10/10/2017  PT End of Session - 10/10/17 0905    Visit Number  2    Number of Visits  13    Date for PT Re-Evaluation  11/10/17    PT Start Time  0800    PT Stop Time  0900    PT Time Calculation (min)  60 min       Past Medical History:  Diagnosis Date  . Anginal pain ([place]) 03/19/2012   saw Dr. Cathie Olden .. she thinks its more related to stomach issues  . Arthritis    "all over; mostly in my back" (03/20/2012)  . Breast cancer ([place])    "right" (03/20/2012)  . Cardiomyopathy   . Chest pain 06/2005   Hospitalized, dystolic dysfunction  . Chronic lower back pain    "have 2 herniated discs; going to have to have a fusion" (03/20/2012)  . Family history of anesthesia complication    "father has nausea and vomiting"  . GERD (gastroesophageal reflux disease)   . H/O hiatal hernia   . History of kidney stones   . History of right mastectomy   . Hypertension    "patient states never had HTN, takes Hyzaar for heart  . Hypothyroidism   . Irritable bowel syndrome   . Migraines    "it's been a long time since I've had one" (03/20/2012)  . Osteoarthritis   . Peripheral neuropathy   . PONV (postoperative nausea and vomiting)   . Sciatica   . Type II diabetes mellitus ([place])     Past Surgical History:  Procedure Laterality Date  . ABDOMINAL HYSTERECTOMY  1993  . adenosin cardiolite  07/2005   (+) wall motion abnormality  . AXILLARY LYMPH NODE DISSECTION  2003   squamous cell cancer  . BACK SURGERY    . CARDIAC CATHETERIZATION  ? 2005 / 2007  . CT abdomen and pelvis  02/2010   Same  . ESOPHAGOGASTRODUODENOSCOPY  2005   GERD  . KNEE ARTHROSCOPY  1990's   "right"  (03/20/2012)  . LUMBAR FUSION N/A 08/22/2014   Procedure: Right L2-3 and right L1-2 transforaminal lumbar interbody fusion with pedicle screws, rods, sleeves, cages, local bone graft, Vivigen, cancellous chips;  Surgeon: Jessy Oto, MD;  Location: [place];  Service: Orthopedics;  Laterality: N/A;  . LUMBAR LAMINECTOMY/DECOMPRESSION MICRODISCECTOMY N/A 02/04/2014   Procedure: RIGHT L2-3 MICRODISCECTOMY ;  Surgeon: Jessy Oto, MD;  Location: [place];  Service: Orthopedics;  Laterality: N/A;  . MASTECTOMY WITH AXILLARY LYMPH NODE DISSECTION  12/2003   "right" (03/20/2012)  . POSTERIOR CERVICAL FUSION/FORAMINOTOMY  2001  . SHOULDER ARTHROSCOPY W/ ROTATOR CUFF REPAIR  2003   Left  . THYROIDECTOMY  1977   Right  . TOTAL KNEE ARTHROPLASTY  2000   Right  . [place] of abdomen  07/2005   Fatty liver / kidney stones    There were no vitals filed for this visit.  Subjective Assessment - 10/10/17 0805    Subjective  A little bit of pain in the low back. Better in the mornings.     Currently in Pain?  Yes    Pain Score  3     Pain Location  Back    Pain  Orientation  Lower    Pain Descriptors / Indicators  Aching    Pain Radiating Towards  RLE     Aggravating Factors   standing, walking     Pain Relieving Factors  sitting, laying down         [facility] PT Assessment - 10/10/17 0001      ROM / Strength   AROM / PROM / Strength  Strength      Strength   Strength Assessment Site  Hip    Right/Left Hip  Right;Left    Right Hip ABduction  4/5    Left Hip ABduction  4-/5                   [facility] Adult PT Treatment/Exercise - 10/10/17 0001      Lumbar Exercises: Stretches   Passive Hamstring Stretch  3 reps;20 seconds    Single Knee to Chest Stretch  3 reps;20 seconds    Hip Flexor Stretch  3 reps;20 seconds      Lumbar Exercises: Supine   Ab Set  10 reps    Pelvic Tilt  10 reps    Glut Set  10 reps    Bent Knee Raise  10 reps    Bridge  10 reps      Lumbar Exercises:  Sidelying   Clam  10 reps    Hip Abduction  10 reps      Modalities   Modalities  Moist Heat      Moist Heat Therapy   Number Minutes Moist Heat  15 Minutes    Moist Heat Location  Lumbar Spine               PT Short Term Goals - 09/29/17 0959      PT SHORT TERM GOAL #1   Title  Pt will be I with HEP    Time  3    Period  Weeks    Status  New    Target Date  10/20/17      PT SHORT TERM GOAL #2   Title  Pt will be able to demonstrate and verbalize proper posture in sitting and standing to decrease low back pain to </= 2/10    Time  3    Period  Weeks    Status  New    Target Date  10/20/17      PT SHORT TERM GOAL #3   Title  Pt will verbalize pain control mechanisms to utilize for low back pain to improve quality of life    Time  3    Period  Weeks    Status  New    Target Date  10/20/17        PT Long Term Goals - 09/29/17 1005      PT LONG TERM GOAL #1   Title  Pt will be I with HEP to ensure progress in physical therapy    Time  6    Period  Weeks    Status  New    Target Date  11/10/17      PT LONG TERM GOAL #2   Title  Pt will increase lumbar flexion ROM to >/= 60 degrees, lumbar extension >/= 20 degrees, lumbar sidebending >/= 20 degrees    Time  6    Period  Weeks    Status  New    Target Date  11/10/17      PT LONG TERM GOAL #3   Title  Pt will increase hip strength to >/= 4+/5 to decrease pain and improve functional mobility in the home and community.     Time  6    Period  Weeks    Status  New    Target Date  11/10/17      PT LONG TERM GOAL #4   Title  Pt will be able to stand for >/= 30 minutes with </= 2/10 pain to be able to stand in kitchen and cook meals.    Time  6    Period  Weeks    Status  New    Target Date  11/10/17      PT LONG TERM GOAL #5   Title  Pt will increase her FOTO score </= 52% limited to improve functional mobility and quality of life    Time  6    Period  Weeks    Status  New    Target Date  11/10/17             Plan - 10/10/17 0905    Clinical Impression Statement  Pt is independent with initial HEP. Continued with gentle core and gluteal strength. Pain at mid sacrum and into RLE. Some increased pain with supine core. She prefers sidelying. Will assess response to this treatment prior to updating HEP.     PT Next Visit Plan  Review/update HEP-assess tolerance to first treatment, finish assessing hip strength, assess SI joint (pt reporting pain in sacrum), glute strengthening, core strengthening and activation    PT Home Exercise Plan  lower trunk rotations, pelvic tilts, hip flexor stretch, glute sets    Consulted and Agree with Plan of Care  Patient       Patient will benefit from skilled therapeutic intervention in order to improve the following deficits and impairments:  Abnormal gait, Pain, Decreased mobility, Postural dysfunction, Decreased activity tolerance, Decreased endurance, Decreased range of motion, Decreased strength, Decreased balance, Difficulty walking, Impaired flexibility, Hypomobility  Visit Diagnosis: Chronic midline low back pain without sciatica  Decreased ROM of lumbar spine  Muscle weakness (generalized)  Abnormal posture     Problem List Patient Active Problem List   Diagnosis Date Noted  . Ingrown toenail 08/22/2017  . Type II diabetes mellitus ([place])   . Sciatica   . PONV (postoperative nausea and vomiting)   . Peripheral neuropathy   . Osteoarthritis   . Migraines   . Irritable bowel syndrome   . Hypertension   . History of right mastectomy   . History of kidney stones   . H/O hiatal hernia   . GERD (gastroesophageal reflux disease)   . Family history of anesthesia complication   . Chronic lower back pain   . Breast cancer ([place])   . Arthritis   . Diabetes mellitus type 2, uncontrolled, with complications ([place])   . Lower extremity edema   . Positive D dimer 03/05/2017  . Elevated d-dimer   . Elevated troponin   . Chronic  constipation 06/23/2015  . Obesity 06/12/2015  . Chronic diastolic heart failure ([place]) 11/17/2014  . HNP (herniated nucleus pulposus), lumbar 08/22/2014    Class: Chronic  . Herniated nucleus pulposus, L2-3 right 02/04/2014    Class: Acute  . Urinary retention 05/04/2012    Class: Temporary  . Spondylolisthesis of lumbar region 04/30/2012    Class: Chronic  . Lumbar stenosis with neurogenic claudication 04/30/2012    Class: Chronic  . Chest pain 03/20/2012  . Anginal pain ([place]) 03/19/2012  .  Right shoulder pain 12/07/2010  . ABDOMINAL PAIN, LOWER 09/21/2009  . BACK PAIN 03/18/2009  . SYNCOPE-CAROTID SINUS 09/16/2008  . Essential hypertension 08/05/2008  . Hypothyroidism 08/23/2006  . Diabetes with neurologic complications ([place]) 76/80/8811  . Diabetic neuropathy ([place]) 08/23/2006  . CARDIOMYOPATHY 08/23/2006  . GERD 08/23/2006  . OSTEOARTHRITIS 08/23/2006  . DDD (degenerative disc disease), cervical 08/23/2006  . [place] DISEASE, LUMBAR 08/23/2006  . Headache(784.0) 08/23/2006  . BREAST CANCER, HX OF 08/23/2006  . HYPERPARATHYROIDISM, HX OF 08/23/2006  . RENAL CALCULUS, HX OF 08/23/2006    Dorene Ar, PTA 10/10/2017, 9:11 AM  [place], [place], [address] Phone: [phone]   Fax:  [phone]  Name: [name] MRN: [id number] Date of Birth: 07/23/1950

## 2017-10-19 ENCOUNTER — Ambulatory Visit: Payer: Medicare HMO | Admitting: Physical Therapy

## 2017-10-19 DIAGNOSIS — M6281 Muscle weakness (generalized): Secondary | ICD-10-CM | POA: Diagnosis not present

## 2017-10-19 DIAGNOSIS — M5441 Lumbago with sciatica, right side: Secondary | ICD-10-CM

## 2017-10-19 DIAGNOSIS — M5386 Other specified dorsopathies, lumbar region: Secondary | ICD-10-CM | POA: Diagnosis not present

## 2017-10-19 DIAGNOSIS — R293 Abnormal posture: Secondary | ICD-10-CM

## 2017-10-19 DIAGNOSIS — G8929 Other chronic pain: Secondary | ICD-10-CM

## 2017-10-19 DIAGNOSIS — M545 Low back pain, unspecified: Secondary | ICD-10-CM

## 2017-10-19 NOTE — Therapy (Addendum)
[facility] [place], [place], [address] Phone: [phone]   Fax:  [phone]  Physical Therapy Treatment  Patient Details  Name: [name] MRN: [id number] Date of Birth: [birth year] Referring Provider: Jessy Oto, MD   Encounter Date: 10/19/2017  PT End of Session - 10/19/17 1113    Visit Number  3    Number of Visits  13    Date for PT Re-Evaluation  11/10/17    PT Start Time  1019    PT Stop Time  1115    PT Time Calculation (min)  56 min    Activity Tolerance  Patient tolerated treatment well    Behavior During Therapy  [facility] for tasks assessed/performed       Past Medical History:  Diagnosis Date  . Anginal pain ([place]) 03/19/2012   saw Dr. Cathie Olden .. she thinks its more related to stomach issues  . Arthritis    "all over; mostly in my back" (03/20/2012)  . Breast cancer ([place])    "right" (03/20/2012)  . Cardiomyopathy   . Chest pain 06/2005   Hospitalized, dystolic dysfunction  . Chronic lower back pain    "have 2 herniated discs; going to have to have a fusion" (03/20/2012)  . Family history of anesthesia complication    "father has nausea and vomiting"  . GERD (gastroesophageal reflux disease)   . H/O hiatal hernia   . History of kidney stones   . History of right mastectomy   . Hypertension    "patient states never had HTN, takes Hyzaar for heart  . Hypothyroidism   . Irritable bowel syndrome   . Migraines    "it's been a long time since I've had one" (03/20/2012)  . Osteoarthritis   . Peripheral neuropathy   . PONV (postoperative nausea and vomiting)   . Sciatica   . Type II diabetes mellitus ([place])     Past Surgical History:  Procedure Laterality Date  . ABDOMINAL HYSTERECTOMY  1993  . adenosin cardiolite  07/2005   (+) wall motion abnormality  . AXILLARY LYMPH NODE DISSECTION  2003   squamous cell cancer  . BACK SURGERY    . CARDIAC CATHETERIZATION  ? 2005 / 2007  . CT  abdomen and pelvis  02/2010   Same  . ESOPHAGOGASTRODUODENOSCOPY  2005   GERD  . KNEE ARTHROSCOPY  1990's   "right" (03/20/2012)  . LUMBAR FUSION N/A 08/22/2014   Procedure: Right L2-3 and right L1-2 transforaminal lumbar interbody fusion with pedicle screws, rods, sleeves, cages, local bone graft, Vivigen, cancellous chips;  Surgeon: Jessy Oto, MD;  Location: [facility];  Service: Orthopedics;  Laterality: N/A;  . LUMBAR LAMINECTOMY/DECOMPRESSION MICRODISCECTOMY N/A 02/04/2014   Procedure: RIGHT L2-3 MICRODISCECTOMY ;  Surgeon: Jessy Oto, MD;  Location: [facility];  Service: Orthopedics;  Laterality: N/A;  . MASTECTOMY WITH AXILLARY LYMPH NODE DISSECTION  12/2003   "right" (03/20/2012)  . POSTERIOR CERVICAL FUSION/FORAMINOTOMY  2001  . SHOULDER ARTHROSCOPY W/ ROTATOR CUFF REPAIR  2003   Left  . THYROIDECTOMY  1977   Right  . TOTAL KNEE ARTHROPLASTY  2000   Right  . [place] of abdomen  07/2005   Fatty liver / kidney stones    There were no vitals filed for this visit.  Subjective Assessment - 10/19/17 1022    Subjective  "I feel terrible. I am in lot of pain."    Currently in Pain?  Yes  Pain Score  9     Pain Location  Back    Pain Orientation  Lower    Pain Descriptors / Indicators  -- steady pain    Pain Type  Chronic pain    Pain Onset  More than a month ago    Pain Frequency  Constant                       [facility] Adult PT Treatment/Exercise - 10/19/17 0001      Lumbar Exercises: Stretches   Active Hamstring Stretch  2 reps;30 seconds;Right;Left seated in chair using stool      Lumbar Exercises: Supine   Pelvic Tilt  20 reps 5 sec hold    Glut Set  10 reps;5 seconds    Straight Leg Raise  5 reps exercise stopped due to pain    Other Supine Lumbar Exercises  lower trunk rotations 1 x 10 bilaterally      Modalities   Modalities  Electrical Stimulation;Moist Heat      Moist Heat Therapy   Number Minutes Moist Heat  15 Minutes    Moist Heat Location   Lumbar Spine      Electrical Stimulation   Electrical Stimulation Location  lumbar spine    Electrical Stimulation Action  IFC    Electrical Stimulation Parameters  15 min, intensity 18    Electrical Stimulation Goals  Pain      Manual Therapy   Manual Therapy  Muscle Energy Technique    Muscle Energy Technique  supine R hip flexion and L hip extension for R SIJ 5 sec hold x 5; 3 sets completed             PT Education - 10/19/17 1112    Education Details  HEP, e-stim    Person(s) Educated  Patient    Methods  Explanation    Comprehension  Verbalized understanding       PT Short Term Goals - 09/29/17 0959      PT SHORT TERM GOAL #1   Title  Pt will be I with HEP    Time  3    Period  Weeks    Status  New    Target Date  10/20/17      PT SHORT TERM GOAL #2   Title  Pt will be able to demonstrate and verbalize proper posture in sitting and standing to decrease low back pain to </= 2/10    Time  3    Period  Weeks    Status  New    Target Date  10/20/17      PT SHORT TERM GOAL #3   Title  Pt will verbalize pain control mechanisms to utilize for low back pain to improve quality of life    Time  3    Period  Weeks    Status  New    Target Date  10/20/17        PT Long Term Goals - 09/29/17 1005      PT LONG TERM GOAL #1   Title  Pt will be I with HEP to ensure progress in physical therapy    Time  6    Period  Weeks    Status  New    Target Date  11/10/17      PT LONG TERM GOAL #2   Title  Pt will increase lumbar flexion ROM to >/= 60 degrees, lumbar extension >/= 20 degrees,  lumbar sidebending >/= 20 degrees    Time  6    Period  Weeks    Status  New    Target Date  11/10/17      PT LONG TERM GOAL #3   Title  Pt will increase hip strength to >/= 4+/5 to decrease pain and improve functional mobility in the home and community.     Time  6    Period  Weeks    Status  New    Target Date  11/10/17      PT LONG TERM GOAL #4   Title  Pt will be able  to stand for >/= 30 minutes with </= 2/10 pain to be able to stand in kitchen and cook meals.    Time  6    Period  Weeks    Status  New    Target Date  11/10/17      PT LONG TERM GOAL #5   Title  Pt will increase her FOTO score </= 52% limited to improve functional mobility and quality of life    Time  6    Period  Weeks    Status  New    Target Date  11/10/17            Plan - 10/19/17 1114    Clinical Impression Statement  Pt in significant pain this session. Treatment focused on continued hip and core strengthening in addition to muscle energy technique. Pt reports decreased pain in back at end of session; however, the pain remained the same in the R thigh.     PT Treatment/Interventions  ADLs/Self Care Home Management;Electrical Stimulation;Traction;Moist Heat;Ultrasound;DME Instruction;Gait training;Stair training;Functional mobility training;Therapeutic activities;Therapeutic exercise;Balance training;Neuromuscular re-education;Patient/family education;Manual techniques;Passive range of motion;Dry needling;Energy conservation;Taping    PT Next Visit Plan  Review/update HEP-assess tolerance to first treatment, assess SI joint (pt reporting pain in sacrum), glute strengthening, core strengthening and activation, MET prn, e-stim IFC prn    PT Home Exercise Plan  lower trunk rotations, pelvic tilts, hip flexor stretch, glute sets    Consulted and Agree with Plan of Care  Patient       Patient will benefit from skilled therapeutic intervention in order to improve the following deficits and impairments:  Abnormal gait, Pain, Decreased mobility, Postural dysfunction, Decreased activity tolerance, Decreased endurance, Decreased range of motion, Decreased strength, Decreased balance, Difficulty walking, Impaired flexibility, Hypomobility  Visit Diagnosis: Chronic midline low back pain without sciatica  Decreased ROM of lumbar spine  Muscle weakness (generalized)  Abnormal  posture  Chronic bilateral low back pain with right-sided sciatica     Problem List Patient Active Problem List   Diagnosis Date Noted  . Ingrown toenail 08/22/2017  . Type II diabetes mellitus ([place])   . Sciatica   . PONV (postoperative nausea and vomiting)   . Peripheral neuropathy   . Osteoarthritis   . Migraines   . Irritable bowel syndrome   . Hypertension   . History of right mastectomy   . History of kidney stones   . H/O hiatal hernia   . GERD (gastroesophageal reflux disease)   . Family history of anesthesia complication   . Chronic lower back pain   . Breast cancer ([place])   . Arthritis   . Diabetes mellitus type 2, uncontrolled, with complications ([place])   . Lower extremity edema   . Positive D dimer 03/05/2017  . Elevated d-dimer   . Elevated troponin   . Chronic constipation 06/23/2015  .  Obesity 06/12/2015  . Chronic diastolic heart failure ([place]) 11/17/2014  . HNP (herniated nucleus pulposus), lumbar 08/22/2014    Class: Chronic  . Herniated nucleus pulposus, L2-3 right 02/04/2014    Class: Acute  . Urinary retention 05/04/2012    Class: Temporary  . Spondylolisthesis of lumbar region 04/30/2012    Class: Chronic  . Lumbar stenosis with neurogenic claudication 04/30/2012    Class: Chronic  . Chest pain 03/20/2012  . Anginal pain ([place]) 03/19/2012  . Right shoulder pain 12/07/2010  . ABDOMINAL PAIN, LOWER 09/21/2009  . BACK PAIN 03/18/2009  . SYNCOPE-CAROTID SINUS 09/16/2008  . Essential hypertension 08/05/2008  . Hypothyroidism 08/23/2006  . Diabetes with neurologic complications ([place]) 45/99/7741  . Diabetic neuropathy ([place]) 08/23/2006  . CARDIOMYOPATHY 08/23/2006  . GERD 08/23/2006  . OSTEOARTHRITIS 08/23/2006  . DDD (degenerative disc disease), cervical 08/23/2006  . [place] DISEASE, LUMBAR 08/23/2006  . Headache(784.0) 08/23/2006  . BREAST CANCER, HX OF 08/23/2006  . HYPERPARATHYROIDISM, HX OF 08/23/2006  . RENAL CALCULUS, HX OF 08/23/2006    Worthy Flank, SPT 10/19/17 11:42 AM   Hessie Diener, PTA 10/19/17 11:49 AM Phone: [phone] Fax: [facility] [address] [place], [place], [address] Phone: [phone]   Fax:  [phone]  Name: [name] MRN: [id number] Date of Birth: 04/12/1950

## 2017-10-23 ENCOUNTER — Ambulatory Visit: Payer: Medicare HMO | Admitting: Physical Therapy

## 2017-10-23 DIAGNOSIS — M5441 Lumbago with sciatica, right side: Secondary | ICD-10-CM

## 2017-10-23 DIAGNOSIS — M545 Low back pain, unspecified: Secondary | ICD-10-CM

## 2017-10-23 DIAGNOSIS — G8929 Other chronic pain: Secondary | ICD-10-CM | POA: Diagnosis not present

## 2017-10-23 DIAGNOSIS — M6281 Muscle weakness (generalized): Secondary | ICD-10-CM | POA: Diagnosis not present

## 2017-10-23 DIAGNOSIS — M5386 Other specified dorsopathies, lumbar region: Secondary | ICD-10-CM

## 2017-10-23 DIAGNOSIS — R293 Abnormal posture: Secondary | ICD-10-CM | POA: Diagnosis not present

## 2017-10-23 NOTE — Patient Instructions (Signed)
Hip Flexor Stretch    Lying on back near edge of bed, bend one leg, foot flat. Hang other leg over edge, relaxed, thigh resting entirely on bed for __1__ minutes. Repeat __1__ times. Do _2___ sessions per day. Advanced Exercise: Bend knee back keeping thigh in contact with bed.

## 2017-10-23 NOTE — Therapy (Signed)
[facility], [place], [address] Phone: [phone]   Fax:  [phone]  Physical Therapy Treatment  Patient Details  Name: [name] MRN: [id number] Date of Birth: Dec 11, 1950 Referring Provider: Jessy Oto, MD   Encounter Date: 10/23/2017  PT End of Session - 10/23/17 1016    Visit Number  4    Number of Visits  13    Date for PT Re-Evaluation  11/10/17    PT Start Time  2248    PT Stop Time  1114    PT Time Calculation (min)  59 min       Past Medical History:  Diagnosis Date  . Anginal pain ([place]) 03/19/2012   saw Dr. Cathie Olden .. she thinks its more related to stomach issues  . Arthritis    "all over; mostly in my back" (03/20/2012)  . Breast cancer ([place])    "right" (03/20/2012)  . Cardiomyopathy   . Chest pain 06/2005   Hospitalized, dystolic dysfunction  . Chronic lower back pain    "have 2 herniated discs; going to have to have a fusion" (03/20/2012)  . Family history of anesthesia complication    "father has nausea and vomiting"  . GERD (gastroesophageal reflux disease)   . H/O hiatal hernia   . History of kidney stones   . History of right mastectomy   . Hypertension    "patient states never had HTN, takes Hyzaar for heart  . Hypothyroidism   . Irritable bowel syndrome   . Migraines    "it's been a long time since I've had one" (03/20/2012)  . Osteoarthritis   . Peripheral neuropathy   . PONV (postoperative nausea and vomiting)   . Sciatica   . Type II diabetes mellitus ([place])     Past Surgical History:  Procedure Laterality Date  . ABDOMINAL HYSTERECTOMY  1993  . adenosin cardiolite  07/2005   (+) wall motion abnormality  . AXILLARY LYMPH NODE DISSECTION  2003   squamous cell cancer  . BACK SURGERY    . CARDIAC CATHETERIZATION  ? 2005 / 2007  . CT abdomen and pelvis  02/2010   Same  . ESOPHAGOGASTRODUODENOSCOPY  2005   GERD  . KNEE ARTHROSCOPY  1990's   "right"  (03/20/2012)  . LUMBAR FUSION N/A 08/22/2014   Procedure: Right L2-3 and right L1-2 transforaminal lumbar interbody fusion with pedicle screws, rods, sleeves, cages, local bone graft, Vivigen, cancellous chips;  Surgeon: Jessy Oto, MD;  Location: [facility];  Service: Orthopedics;  Laterality: N/A;  . LUMBAR LAMINECTOMY/DECOMPRESSION MICRODISCECTOMY N/A 02/04/2014   Procedure: RIGHT L2-3 MICRODISCECTOMY ;  Surgeon: Jessy Oto, MD;  Location: [facility];  Service: Orthopedics;  Laterality: N/A;  . MASTECTOMY WITH AXILLARY LYMPH NODE DISSECTION  12/2003   "right" (03/20/2012)  . POSTERIOR CERVICAL FUSION/FORAMINOTOMY  2001  . SHOULDER ARTHROSCOPY W/ ROTATOR CUFF REPAIR  2003   Left  . THYROIDECTOMY  1977   Right  . TOTAL KNEE ARTHROPLASTY  2000   Right  . [place] of abdomen  07/2005   Fatty liver / kidney stones    There were no vitals filed for this visit.  Subjective Assessment - 10/23/17 1027    Subjective  I feel  better today. Saturday was terrible. I had to do alot of walking and navigating through a crowd.     Currently in Pain?  Yes    Pain Score  4     Pain  Location  Back    Pain Orientation  Lower;Mid    Pain Descriptors / Indicators  Aching;Dull    Pain Radiating Towards  Right lateral upper leg     Aggravating Factors   standing and walking     Pain Relieving Factors  sitting, laying down                       [facility] Adult PT Treatment/Exercise - 10/23/17 0001      Lumbar Exercises: Stretches   Lower Trunk Rotation  10 seconds 10 reps    Hip Flexor Stretch  3 reps;20 seconds passively in sidelying, then supine with STW      Lumbar Exercises: Supine   Pelvic Tilt  20 reps 5 sec hold    Bridge  10 reps    Other Supine Lumbar Exercises  ball squeeze with abdominal draw in       Lumbar Exercises: Sidelying   Clam  20 reps;Left;Both      Theme park manager  lumbar spine    Electrical Stimulation Action  IFC x 15 min     Electrical Stimulation Parameters  intensity 29 ma    Electrical Stimulation Goals  Pain      Manual Therapy   Manual therapy comments  massage roller to bilateral thighs during hip flexor stretches              PT Education - 10/23/17 1059    Education Details  HEP; use of rolling pin to quads, HMP  of muscle soreness in quads    Person(s) Educated  Patient    Methods  Explanation;Handout    Comprehension  Verbalized understanding       PT Short Term Goals - 09/29/17 0959      PT SHORT TERM GOAL #1   Title  Pt will be I with HEP    Time  3    Period  Weeks    Status  New    Target Date  10/20/17      PT SHORT TERM GOAL #2   Title  Pt will be able to demonstrate and verbalize proper posture in sitting and standing to decrease low back pain to </= 2/10    Time  3    Period  Weeks    Status  New    Target Date  10/20/17      PT SHORT TERM GOAL #3   Title  Pt will verbalize pain control mechanisms to utilize for low back pain to improve quality of life    Time  3    Period  Weeks    Status  New    Target Date  10/20/17        PT Long Term Goals - 09/29/17 1005      PT LONG TERM GOAL #1   Title  Pt will be I with HEP to ensure progress in physical therapy    Time  6    Period  Weeks    Status  New    Target Date  11/10/17      PT LONG TERM GOAL #2   Title  Pt will increase lumbar flexion ROM to >/= 60 degrees, lumbar extension >/= 20 degrees, lumbar sidebending >/= 20 degrees    Time  6    Period  Weeks    Status  New    Target Date  11/10/17      PT  LONG TERM GOAL #3   Title  Pt will increase hip strength to >/= 4+/5 to decrease pain and improve functional mobility in the home and community.     Time  6    Period  Weeks    Status  New    Target Date  11/10/17      PT LONG TERM GOAL #4   Title  Pt will be able to stand for >/= 30 minutes with </= 2/10 pain to be able to stand in kitchen and cook meals.    Time  6    Period  Weeks    Status  New     Target Date  11/10/17      PT LONG TERM GOAL #5   Title  Pt will increase her FOTO score </= 52% limited to improve functional mobility and quality of life    Time  6    Period  Weeks    Status  New    Target Date  11/10/17            Plan - 10/23/17 1107    Clinical Impression Statement  Less pain today however she reports severe pain this past weekend with standing and walking. Focused stretching hip flexors and quads today with soft tissue work. Instructed pt in hp flexor stretch at home as well as self massage with rolling pin to thighs. She has not performed her initial hip flexor stretch as often as the other home exercises. No increased pain upon standing. Repeated IFC per pt request. Pt continues to be mid sacrum.     PT Next Visit Plan  Review/update HEP-assess tolerance to first treatment, assess SI joint (pt reporting pain in sacrum), glute strengthening, core strengthening and activation, MET prn, e-stim IFC prn    PT Home Exercise Plan  lower trunk rotations, pelvic tilts, hip flexor stretch, glute sets, reinfoced hip flexor stetch and added knee flexion during.     Consulted and Agree with Plan of Care  Patient       Patient will benefit from skilled therapeutic intervention in order to improve the following deficits and impairments:  Abnormal gait, Pain, Decreased mobility, Postural dysfunction, Decreased activity tolerance, Decreased endurance, Decreased range of motion, Decreased strength, Decreased balance, Difficulty walking, Impaired flexibility, Hypomobility  Visit Diagnosis: Chronic midline low back pain without sciatica  Decreased ROM of lumbar spine  Chronic bilateral low back pain with right-sided sciatica  Muscle weakness (generalized)     Problem List Patient Active Problem List   Diagnosis Date Noted  . Ingrown toenail 08/22/2017  . Type II diabetes mellitus ([place])   . Sciatica   . PONV (postoperative nausea and vomiting)   . Peripheral  neuropathy   . Osteoarthritis   . Migraines   . Irritable bowel syndrome   . Hypertension   . History of right mastectomy   . History of kidney stones   . H/O hiatal hernia   . GERD (gastroesophageal reflux disease)   . Family history of anesthesia complication   . Chronic lower back pain   . Breast cancer ([place])   . Arthritis   . Diabetes mellitus type 2, uncontrolled, with complications ([place])   . Lower extremity edema   . Positive D dimer 03/05/2017  . Elevated d-dimer   . Elevated troponin   . Chronic constipation 06/23/2015  . Obesity 06/12/2015  . Chronic diastolic heart failure ([place]) 11/17/2014  . HNP (herniated nucleus pulposus), lumbar 08/22/2014  Class: Chronic  . Herniated nucleus pulposus, L2-3 right 02/04/2014    Class: Acute  . Urinary retention 05/04/2012    Class: Temporary  . Spondylolisthesis of lumbar region 04/30/2012    Class: Chronic  . Lumbar stenosis with neurogenic claudication 04/30/2012    Class: Chronic  . Chest pain 03/20/2012  . Anginal pain ([place]) 03/19/2012  . Right shoulder pain 12/07/2010  . ABDOMINAL PAIN, LOWER 09/21/2009  . BACK PAIN 03/18/2009  . SYNCOPE-CAROTID SINUS 09/16/2008  . Essential hypertension 08/05/2008  . Hypothyroidism 08/23/2006  . Diabetes with neurologic complications ([place]) 90/56/4698  . Diabetic neuropathy ([place]) 08/23/2006  . CARDIOMYOPATHY 08/23/2006  . GERD 08/23/2006  . OSTEOARTHRITIS 08/23/2006  . DDD (degenerative disc disease), cervical 08/23/2006  . [place] DISEASE, LUMBAR 08/23/2006  . Headache(784.0) 08/23/2006  . BREAST CANCER, HX OF 08/23/2006  . HYPERPARATHYROIDISM, HX OF 08/23/2006  . RENAL CALCULUS, HX OF 08/23/2006    Dorene Ar, PTA 10/23/2017, 11:11 AM  [facility] [place], [place], [address] Phone: [phone]   Fax:  [phone]  Name: [name] MRN: [id number] Date of Birth: 07/12/1950

## 2017-10-25 ENCOUNTER — Ambulatory Visit: Payer: Medicare HMO | Admitting: Physical Therapy

## 2017-10-25 DIAGNOSIS — M5441 Lumbago with sciatica, right side: Secondary | ICD-10-CM | POA: Diagnosis not present

## 2017-10-25 DIAGNOSIS — R293 Abnormal posture: Secondary | ICD-10-CM

## 2017-10-25 DIAGNOSIS — M545 Low back pain: Principal | ICD-10-CM

## 2017-10-25 DIAGNOSIS — G8929 Other chronic pain: Secondary | ICD-10-CM | POA: Diagnosis not present

## 2017-10-25 DIAGNOSIS — M6281 Muscle weakness (generalized): Secondary | ICD-10-CM | POA: Diagnosis not present

## 2017-10-25 DIAGNOSIS — M5386 Other specified dorsopathies, lumbar region: Secondary | ICD-10-CM | POA: Diagnosis not present

## 2017-10-25 NOTE — Therapy (Signed)
[place], [place], [address] Phone: [phone]   Fax:  [phone]  Physical Therapy Treatment  Patient Details  Name: [name] MRN: [id number] Date of Birth: 03-01-1951 Referring Provider: Jessy Oto, MD   Encounter Date: 10/25/2017  PT End of Session - 10/25/17 1514    Visit Number  5    Number of Visits  13    Date for PT Re-Evaluation  11/10/17    PT Start Time  6599    PT Stop Time  1504    PT Time Calculation (min)  48 min    Activity Tolerance  Patient limited by pain    Behavior During Therapy  [facility] for tasks assessed/performed       Past Medical History:  Diagnosis Date  . Anginal pain ([place]) 03/19/2012   saw Dr. Cathie Olden .. she thinks its more related to stomach issues  . Arthritis    "all over; mostly in my back" (03/20/2012)  . Breast cancer ([place])    "right" (03/20/2012)  . Cardiomyopathy   . Chest pain 06/2005   Hospitalized, dystolic dysfunction  . Chronic lower back pain    "have 2 herniated discs; going to have to have a fusion" (03/20/2012)  . Family history of anesthesia complication    "father has nausea and vomiting"  . GERD (gastroesophageal reflux disease)   . H/O hiatal hernia   . History of kidney stones   . History of right mastectomy   . Hypertension    "patient states never had HTN, takes Hyzaar for heart  . Hypothyroidism   . Irritable bowel syndrome   . Migraines    "it's been a long time since I've had one" (03/20/2012)  . Osteoarthritis   . Peripheral neuropathy   . PONV (postoperative nausea and vomiting)   . Sciatica   . Type II diabetes mellitus ([place])     Past Surgical History:  Procedure Laterality Date  . ABDOMINAL HYSTERECTOMY  1993  . adenosin cardiolite  07/2005   (+) wall motion abnormality  . AXILLARY LYMPH NODE DISSECTION  2003   squamous cell cancer  . BACK SURGERY    . CARDIAC CATHETERIZATION  ? 2005 / 2007  . CT abdomen and  pelvis  02/2010   Same  . ESOPHAGOGASTRODUODENOSCOPY  2005   GERD  . KNEE ARTHROSCOPY  1990's   "right" (03/20/2012)  . LUMBAR FUSION N/A 08/22/2014   Procedure: Right L2-3 and right L1-2 transforaminal lumbar interbody fusion with pedicle screws, rods, sleeves, cages, local bone graft, Vivigen, cancellous chips;  Surgeon: Jessy Oto, MD;  Location: [place];  Service: Orthopedics;  Laterality: N/A;  . LUMBAR LAMINECTOMY/DECOMPRESSION MICRODISCECTOMY N/A 02/04/2014   Procedure: RIGHT L2-3 MICRODISCECTOMY ;  Surgeon: Jessy Oto, MD;  Location: [place];  Service: Orthopedics;  Laterality: N/A;  . MASTECTOMY WITH AXILLARY LYMPH NODE DISSECTION  12/2003   "right" (03/20/2012)  . POSTERIOR CERVICAL FUSION/FORAMINOTOMY  2001  . SHOULDER ARTHROSCOPY W/ ROTATOR CUFF REPAIR  2003   Left  . THYROIDECTOMY  1977   Right  . TOTAL KNEE ARTHROPLASTY  2000   Right  . [place] of abdomen  07/2005   Fatty liver / kidney stones    There were no vitals filed for this visit.  Subjective Assessment - 10/25/17 1419    Subjective  "I feel fine today. I'm good as long as I don't have to walk."    Currently in Pain?  Yes    Pain Score  5     Pain Location  Back    Pain Orientation  Lower    Pain Descriptors / Indicators  Aching;Dull    Pain Type  Chronic pain    Pain Onset  More than a month ago    Pain Frequency  Constant    Aggravating Factors   walking         [facility] PT Assessment - 10/25/17 0001      AROM   Right/Left Ankle  Right;Left    Right Ankle Dorsiflexion  0    Left Ankle Dorsiflexion  0      Special Tests   Other special tests  negative R slump test                   [facility] Adult PT Treatment/Exercise - 10/25/17 0001      Lumbar Exercises: Stretches   Passive Hamstring Stretch  2 reps;30 seconds seated    Hip Flexor Stretch  Left;Right;2 reps;30 seconds standing at counter      Lumbar Exercises: Seated   Other Seated Lumbar Exercises  seated physioball roll outs with  MTPr in between sets      Lumbar Exercises: Supine   Other Supine Lumbar Exercises  R Hamstring eccentrics x 5    Other Supine Lumbar Exercises  ball squeeze 5 sec x 10      Knee/Hip Exercises: Standing   Hip Abduction  2 sets;10 reps;Both;Stengthening;AROM standing at counter. cues for limiting ROM due to pain      Manual Therapy   Soft tissue mobilization  MTPr R lumbar paraspinals    Muscle Energy Technique  supine hip adduction knees bent 5 sec x 5               PT Short Term Goals - 09/29/17 0959      PT SHORT TERM GOAL #1   Title  Pt will be I with HEP    Time  3    Period  Weeks    Status  New    Target Date  10/20/17      PT SHORT TERM GOAL #2   Title  Pt will be able to demonstrate and verbalize proper posture in sitting and standing to decrease low back pain to </= 2/10    Time  3    Period  Weeks    Status  New    Target Date  10/20/17      PT SHORT TERM GOAL #3   Title  Pt will verbalize pain control mechanisms to utilize for low back pain to improve quality of life    Time  3    Period  Weeks    Status  New    Target Date  10/20/17        PT Long Term Goals - 09/29/17 1005      PT LONG TERM GOAL #1   Title  Pt will be I with HEP to ensure progress in physical therapy    Time  6    Period  Weeks    Status  New    Target Date  11/10/17      PT LONG TERM GOAL #2   Title  Pt will increase lumbar flexion ROM to >/= 60 degrees, lumbar extension >/= 20 degrees, lumbar sidebending >/= 20 degrees    Time  6    Period  Weeks    Status  New  Target Date  11/10/17      PT LONG TERM GOAL #3   Title  Pt will increase hip strength to >/= 4+/5 to decrease pain and improve functional mobility in the home and community.     Time  6    Period  Weeks    Status  New    Target Date  11/10/17      PT LONG TERM GOAL #4   Title  Pt will be able to stand for >/= 30 minutes with </= 2/10 pain to be able to stand in kitchen and cook meals.    Time  6     Period  Weeks    Status  New    Target Date  11/10/17      PT LONG TERM GOAL #5   Title  Pt will increase her FOTO score </= 52% limited to improve functional mobility and quality of life    Time  6    Period  Weeks    Status  New    Target Date  11/10/17            Plan - 10/25/17 1515    Clinical Impression Statement  Treatment today focused on hip strengthening, MTPr to R lumbar paraspinals, and MET. Pt reports that she has been tripping over her own feet recently. Measured ankle DF AROM; pt only able to achieve neutral bilaterally. Additionally, pt's R ASIS appears to be more inferior compared to the L based on palpation. Therefore, focused on stretching of R hip flexor and activation of R hamstrings. Also performed hip adduction MET to address possible R SI inflare.     PT Treatment/Interventions  ADLs/Self Care Home Management;Electrical Stimulation;Traction;Moist Heat;Ultrasound;DME Instruction;Gait training;Stair training;Functional mobility training;Therapeutic activities;Therapeutic exercise;Balance training;Neuromuscular re-education;Patient/family education;Manual techniques;Passive range of motion;Dry needling;Energy conservation;Taping    PT Next Visit Plan  Review/update HEP-assess tolerance to first treatment, assess SI joint (pt reporting pain in sacrum), glute strengthening, core strengthening and activation, MET prn, e-stim IFC prn    PT Home Exercise Plan  lower trunk rotations, pelvic tilts, hip flexor stretch, glute sets, reinfoced hip flexor stetch and added knee flexion during.     Consulted and Agree with Plan of Care  Patient       Patient will benefit from skilled therapeutic intervention in order to improve the following deficits and impairments:  Abnormal gait, Pain, Decreased mobility, Postural dysfunction, Decreased activity tolerance, Decreased endurance, Decreased range of motion, Decreased strength, Decreased balance, Difficulty walking, Impaired  flexibility, Hypomobility  Visit Diagnosis: Chronic midline low back pain without sciatica  Decreased ROM of lumbar spine  Chronic bilateral low back pain with right-sided sciatica  Muscle weakness (generalized)  Abnormal posture     Problem List Patient Active Problem List   Diagnosis Date Noted  . Ingrown toenail 08/22/2017  . Type II diabetes mellitus ([place])   . Sciatica   . PONV (postoperative nausea and vomiting)   . Peripheral neuropathy   . Osteoarthritis   . Migraines   . Irritable bowel syndrome   . Hypertension   . History of right mastectomy   . History of kidney stones   . H/O hiatal hernia   . GERD (gastroesophageal reflux disease)   . Family history of anesthesia complication   . Chronic lower back pain   . Breast cancer ([place])   . Arthritis   . Diabetes mellitus type 2, uncontrolled, with complications ([place])   . Lower extremity edema   . Positive  D dimer 03/05/2017  . Elevated d-dimer   . Elevated troponin   . Chronic constipation 06/23/2015  . Obesity 06/12/2015  . Chronic diastolic heart failure ([place]) 11/17/2014  . HNP (herniated nucleus pulposus), lumbar 08/22/2014    Class: Chronic  . Herniated nucleus pulposus, L2-3 right 02/04/2014    Class: Acute  . Urinary retention 05/04/2012    Class: Temporary  . Spondylolisthesis of lumbar region 04/30/2012    Class: Chronic  . Lumbar stenosis with neurogenic claudication 04/30/2012    Class: Chronic  . Chest pain 03/20/2012  . Anginal pain ([place]) 03/19/2012  . Right shoulder pain 12/07/2010  . ABDOMINAL PAIN, LOWER 09/21/2009  . BACK PAIN 03/18/2009  . SYNCOPE-CAROTID SINUS 09/16/2008  . Essential hypertension 08/05/2008  . Hypothyroidism 08/23/2006  . Diabetes with neurologic complications ([place]) 09/92/7800  . Diabetic neuropathy ([place]) 08/23/2006  . CARDIOMYOPATHY 08/23/2006  . GERD 08/23/2006  . OSTEOARTHRITIS 08/23/2006  . DDD (degenerative disc disease), cervical 08/23/2006  . [place]  DISEASE, LUMBAR 08/23/2006  . Headache(784.0) 08/23/2006  . BREAST CANCER, HX OF 08/23/2006  . HYPERPARATHYROIDISM, HX OF 08/23/2006  . RENAL CALCULUS, HX OF 08/23/2006   Worthy Flank, SPT 10/25/17 3:22 PM   [facility] [address] [place], [place], [address] Phone: [phone]   Fax:  [phone]  Name: [name] MRN: [id number] Date of Birth: [birth year]

## 2017-10-30 ENCOUNTER — Ambulatory Visit: Payer: Medicare HMO | Admitting: Physical Therapy

## 2017-10-30 ENCOUNTER — Encounter: Payer: Self-pay | Admitting: Physical Therapy

## 2017-10-30 DIAGNOSIS — M5441 Lumbago with sciatica, right side: Secondary | ICD-10-CM | POA: Diagnosis not present

## 2017-10-30 DIAGNOSIS — M545 Low back pain, unspecified: Secondary | ICD-10-CM

## 2017-10-30 DIAGNOSIS — M5386 Other specified dorsopathies, lumbar region: Secondary | ICD-10-CM

## 2017-10-30 DIAGNOSIS — G8929 Other chronic pain: Secondary | ICD-10-CM

## 2017-10-30 DIAGNOSIS — R293 Abnormal posture: Secondary | ICD-10-CM | POA: Diagnosis not present

## 2017-10-30 DIAGNOSIS — M6281 Muscle weakness (generalized): Secondary | ICD-10-CM | POA: Diagnosis not present

## 2017-10-30 NOTE — Therapy (Signed)
[place], [place], [address] Phone: [phone]   Fax:  [phone]  Physical Therapy Treatment  Patient Details  Name: [name] MRN: [id number] Date of Birth: [birth year] Referring Provider: Jessy Oto, MD   Encounter Date: 10/30/2017  PT End of Session - 10/30/17 1019    Visit Number  6    Number of Visits  13    Date for PT Re-Evaluation  11/10/17    PT Start Time  1016    PT Stop Time  1108    PT Time Calculation (min)  52 min       Past Medical History:  Diagnosis Date  . Anginal pain ([place]) 03/19/2012   saw Dr. Cathie Olden .. she thinks its more related to stomach issues  . Arthritis    "all over; mostly in my back" (03/20/2012)  . Breast cancer ([place])    "right" (03/20/2012)  . Cardiomyopathy   . Chest pain 06/2005   Hospitalized, dystolic dysfunction  . Chronic lower back pain    "have 2 herniated discs; going to have to have a fusion" (03/20/2012)  . Family history of anesthesia complication    "father has nausea and vomiting"  . GERD (gastroesophageal reflux disease)   . H/O hiatal hernia   . History of kidney stones   . History of right mastectomy   . Hypertension    "patient states never had HTN, takes Hyzaar for heart  . Hypothyroidism   . Irritable bowel syndrome   . Migraines    "it's been a long time since I've had one" (03/20/2012)  . Osteoarthritis   . Peripheral neuropathy   . PONV (postoperative nausea and vomiting)   . Sciatica   . Type II diabetes mellitus ([place])     Past Surgical History:  Procedure Laterality Date  . ABDOMINAL HYSTERECTOMY  1993  . adenosin cardiolite  07/2005   (+) wall motion abnormality  . AXILLARY LYMPH NODE DISSECTION  2003   squamous cell cancer  . BACK SURGERY    . CARDIAC CATHETERIZATION  ? 2005 / 2007  . CT abdomen and pelvis  02/2010   Same  . ESOPHAGOGASTRODUODENOSCOPY  2005   GERD  . KNEE ARTHROSCOPY  1990's   "right"  (03/20/2012)  . LUMBAR FUSION N/A 08/22/2014   Procedure: Right L2-3 and right L1-2 transforaminal lumbar interbody fusion with pedicle screws, rods, sleeves, cages, local bone graft, Vivigen, cancellous chips;  Surgeon: Jessy Oto, MD;  Location: [place];  Service: Orthopedics;  Laterality: N/A;  . LUMBAR LAMINECTOMY/DECOMPRESSION MICRODISCECTOMY N/A 02/04/2014   Procedure: RIGHT L2-3 MICRODISCECTOMY ;  Surgeon: Jessy Oto, MD;  Location: [place];  Service: Orthopedics;  Laterality: N/A;  . MASTECTOMY WITH AXILLARY LYMPH NODE DISSECTION  12/2003   "right" (03/20/2012)  . POSTERIOR CERVICAL FUSION/FORAMINOTOMY  2001  . SHOULDER ARTHROSCOPY W/ ROTATOR CUFF REPAIR  2003   Left  . THYROIDECTOMY  1977   Right  . TOTAL KNEE ARTHROPLASTY  2000   Right  . [place] of abdomen  07/2005   Fatty liver / kidney stones    There were no vitals filed for this visit.  Subjective Assessment - 10/30/17 1017    Subjective  Had some right hip pain last night, could not sleep .Used a heating pad.     Currently in Pain?  Yes    Pain Score  4     Pain Location  Back  Pain Orientation  Lower    Pain Descriptors / Indicators  Aching;Dull                       [facility] Adult PT Treatment/Exercise - 10/30/17 0001      Lumbar Exercises: Stretches   Passive Hamstring Stretch  2 reps;30 seconds seated    Lower Trunk Rotation  10 seconds 10 reps      Lumbar Exercises: Supine   Clam  20 reps green    Bridge  10 reps    Bridge with clamshell  10 reps    Other Supine Lumbar Exercises  bridge with feet on ball, supine hamstring curls with feet on ball       Lumbar Exercises: Sidelying   Clam  Both;Right;20 reps    Other Sidelying Lumbar Exercises  reverse clam       Modalities   Modalities  Cryotherapy      Cryotherapy   Number Minutes Cryotherapy  10 Minutes    Cryotherapy Location  Lumbar Spine    Type of Cryotherapy  Ice pack      Manual Therapy   Manual therapy comments  right hip  flexor massage roller. , LAD bilateral Right A/P hip  mobs grade III , ITB/glute with massage roller in side lying               PT Short Term Goals - 09/29/17 0959      PT SHORT TERM GOAL #1   Title  Pt will be I with HEP    Time  3    Period  Weeks    Status  New    Target Date  10/20/17      PT SHORT TERM GOAL #2   Title  Pt will be able to demonstrate and verbalize proper posture in sitting and standing to decrease low back pain to </= 2/10    Time  3    Period  Weeks    Status  New    Target Date  10/20/17      PT SHORT TERM GOAL #3   Title  Pt will verbalize pain control mechanisms to utilize for low back pain to improve quality of life    Time  3    Period  Weeks    Status  New    Target Date  10/20/17        PT Long Term Goals - 09/29/17 1005      PT LONG TERM GOAL #1   Title  Pt will be I with HEP to ensure progress in physical therapy    Time  6    Period  Weeks    Status  New    Target Date  11/10/17      PT LONG TERM GOAL #2   Title  Pt will increase lumbar flexion ROM to >/= 60 degrees, lumbar extension >/= 20 degrees, lumbar sidebending >/= 20 degrees    Time  6    Period  Weeks    Status  New    Target Date  11/10/17      PT LONG TERM GOAL #3   Title  Pt will increase hip strength to >/= 4+/5 to decrease pain and improve functional mobility in the home and community.     Time  6    Period  Weeks    Status  New    Target Date  11/10/17      PT  LONG TERM GOAL #4   Title  Pt will be able to stand for >/= 30 minutes with </= 2/10 pain to be able to stand in kitchen and cook meals.    Time  6    Period  Weeks    Status  New    Target Date  11/10/17      PT LONG TERM GOAL #5   Title  Pt will increase her FOTO score </= 52% limited to improve functional mobility and quality of life    Time  6    Period  Weeks    Status  New    Target Date  11/10/17            Plan - 10/30/17 1100    Clinical Impression Statement  Pt reports  increased right lateral hip pain last night. Worked on hip mobility and strength. Continues with tight hip flexors and quads. She continues with soreness and pain at right/mid low back. Trial of ice in sitting at end of session. Pt reports feeling fine at end  of session.     PT Next Visit Plan  CHECK GOALS Review/update HEP-, assess SI joint (pt reporting pain in sacrum), glute strengthening, core strengthening and activation, MET prn, e-stim IFC prn    PT Home Exercise Plan  lower trunk rotations, pelvic tilts, hip flexor stretch, glute sets, reinfoced hip flexor stetch and added knee flexion during.     Consulted and Agree with Plan of Care  Patient       Patient will benefit from skilled therapeutic intervention in order to improve the following deficits and impairments:  Abnormal gait, Pain, Decreased mobility, Postural dysfunction, Decreased activity tolerance, Decreased endurance, Decreased range of motion, Decreased strength, Decreased balance, Difficulty walking, Impaired flexibility, Hypomobility  Visit Diagnosis: Chronic midline low back pain without sciatica  Decreased ROM of lumbar spine  Chronic bilateral low back pain with right-sided sciatica  Muscle weakness (generalized)  Abnormal posture     Problem List Patient Active Problem List   Diagnosis Date Noted  . Ingrown toenail 08/22/2017  . Type II diabetes mellitus ([place])   . Sciatica   . PONV (postoperative nausea and vomiting)   . Peripheral neuropathy   . Osteoarthritis   . Migraines   . Irritable bowel syndrome   . Hypertension   . History of right mastectomy   . History of kidney stones   . H/O hiatal hernia   . GERD (gastroesophageal reflux disease)   . Family history of anesthesia complication   . Chronic lower back pain   . Breast cancer ([place])   . Arthritis   . Diabetes mellitus type 2, uncontrolled, with complications ([place])   . Lower extremity edema   . Positive D dimer 03/05/2017  . Elevated  d-dimer   . Elevated troponin   . Chronic constipation 06/23/2015  . Obesity 06/12/2015  . Chronic diastolic heart failure ([place]) 11/17/2014  . HNP (herniated nucleus pulposus), lumbar 08/22/2014    Class: Chronic  . Herniated nucleus pulposus, L2-3 right 02/04/2014    Class: Acute  . Urinary retention 05/04/2012    Class: Temporary  . Spondylolisthesis of lumbar region 04/30/2012    Class: Chronic  . Lumbar stenosis with neurogenic claudication 04/30/2012    Class: Chronic  . Chest pain 03/20/2012  . Anginal pain ([place]) 03/19/2012  . Right shoulder pain 12/07/2010  . ABDOMINAL PAIN, LOWER 09/21/2009  . BACK PAIN 03/18/2009  . SYNCOPE-CAROTID SINUS 09/16/2008  .  Essential hypertension 08/05/2008  . Hypothyroidism 08/23/2006  . Diabetes with neurologic complications ([place]) 59/74/1638  . Diabetic neuropathy ([place]) 08/23/2006  . CARDIOMYOPATHY 08/23/2006  . GERD 08/23/2006  . OSTEOARTHRITIS 08/23/2006  . DDD (degenerative disc disease), cervical 08/23/2006  . [place] DISEASE, LUMBAR 08/23/2006  . Headache(784.0) 08/23/2006  . BREAST CANCER, HX OF 08/23/2006  . HYPERPARATHYROIDISM, HX OF 08/23/2006  . RENAL CALCULUS, HX OF 08/23/2006    Dorene Ar, PTA 10/30/2017, 11:15 AM  [place], [place], [address] Phone: [phone]   Fax:  [phone]  Name: VALISHA HESLIN MRN: [id number] Date of Birth: 06-Feb-1951

## 2017-11-01 ENCOUNTER — Encounter: Payer: Self-pay | Admitting: Physical Therapy

## 2017-11-01 ENCOUNTER — Ambulatory Visit: Payer: Medicare HMO | Admitting: Physical Therapy

## 2017-11-01 DIAGNOSIS — G8929 Other chronic pain: Secondary | ICD-10-CM | POA: Diagnosis not present

## 2017-11-01 DIAGNOSIS — M545 Low back pain, unspecified: Secondary | ICD-10-CM

## 2017-11-01 DIAGNOSIS — R293 Abnormal posture: Secondary | ICD-10-CM

## 2017-11-01 DIAGNOSIS — M5441 Lumbago with sciatica, right side: Secondary | ICD-10-CM | POA: Diagnosis not present

## 2017-11-01 DIAGNOSIS — M5386 Other specified dorsopathies, lumbar region: Secondary | ICD-10-CM | POA: Diagnosis not present

## 2017-11-01 DIAGNOSIS — M6281 Muscle weakness (generalized): Secondary | ICD-10-CM

## 2017-11-01 NOTE — Therapy (Addendum)
[place], [place], [address] Phone: [phone]   Fax:  [phone]  Physical Therapy Treatment / Discharge Summary  Patient Details  Name: [name] MRN: [id number] Date of Birth: [birth year] Referring Provider: Jessy Oto, MD   Encounter Date: 11/01/2017  PT End of Session - 11/01/17 1156    Visit Number  7    Number of Visits  13    Date for PT Re-Evaluation  11/10/17    PT Start Time  1100    PT Stop Time  1145    PT Time Calculation (min)  45 min    Activity Tolerance  Patient tolerated treatment well    Behavior During Therapy  [facility] for tasks assessed/performed       Past Medical History:  Diagnosis Date  . Anginal pain ([place]) 03/19/2012   saw Dr. Cathie Olden .. she thinks its more related to stomach issues  . Arthritis    "all over; mostly in my back" (03/20/2012)  . Breast cancer ([place])    "right" (03/20/2012)  . Cardiomyopathy   . Chest pain 06/2005   Hospitalized, dystolic dysfunction  . Chronic lower back pain    "have 2 herniated discs; going to have to have a fusion" (03/20/2012)  . Family history of anesthesia complication    "father has nausea and vomiting"  . GERD (gastroesophageal reflux disease)   . H/O hiatal hernia   . History of kidney stones   . History of right mastectomy   . Hypertension    "patient states never had HTN, takes Hyzaar for heart  . Hypothyroidism   . Irritable bowel syndrome   . Migraines    "it's been a long time since I've had one" (03/20/2012)  . Osteoarthritis   . Peripheral neuropathy   . PONV (postoperative nausea and vomiting)   . Sciatica   . Type II diabetes mellitus ([place])     Past Surgical History:  Procedure Laterality Date  . ABDOMINAL HYSTERECTOMY  1993  . adenosin cardiolite  07/2005   (+) wall motion abnormality  . AXILLARY LYMPH NODE DISSECTION  2003   squamous cell cancer  . BACK SURGERY    . CARDIAC CATHETERIZATION  ?  2005 / 2007  . CT abdomen and pelvis  02/2010   Same  . ESOPHAGOGASTRODUODENOSCOPY  2005   GERD  . KNEE ARTHROSCOPY  1990's   "right" (03/20/2012)  . LUMBAR FUSION N/A 08/22/2014   Procedure: Right L2-3 and right L1-2 transforaminal lumbar interbody fusion with pedicle screws, rods, sleeves, cages, local bone graft, Vivigen, cancellous chips;  Surgeon: Jessy Oto, MD;  Location: [facility];  Service: Orthopedics;  Laterality: N/A;  . LUMBAR LAMINECTOMY/DECOMPRESSION MICRODISCECTOMY N/A 02/04/2014   Procedure: RIGHT L2-3 MICRODISCECTOMY ;  Surgeon: Jessy Oto, MD;  Location: [facility];  Service: Orthopedics;  Laterality: N/A;  . MASTECTOMY WITH AXILLARY LYMPH NODE DISSECTION  12/2003   "right" (03/20/2012)  . POSTERIOR CERVICAL FUSION/FORAMINOTOMY  2001  . SHOULDER ARTHROSCOPY W/ ROTATOR CUFF REPAIR  2003   Left  . THYROIDECTOMY  1977   Right  . TOTAL KNEE ARTHROPLASTY  2000   Right  . [place] of abdomen  07/2005   Fatty liver / kidney stones    There were no vitals filed for this visit.  Subjective Assessment - 11/01/17 1111    Subjective  "I feel pretty good today. I couldn't sleep last night due to pain in my feet. I  have neuropathy."    Currently in Pain?  Yes    Pain Score  3     Pain Location  Back    Pain Orientation  Lower    Pain Descriptors / Indicators  Aching    Pain Type  Chronic pain    Pain Onset  More than a month ago    Pain Frequency  Constant                       [facility] Adult PT Treatment/Exercise - 11/01/17 0001      Self-Care   Self-Care  -- trialed SI belt, pt reports decreased pain in standing      Lumbar Exercises: Aerobic   Nustep  L3 x 5 min UE and LE      Lumbar Exercises: Standing   Other Standing Lumbar Exercises  postural training while standing at counter with SI belt      Manual Therapy   Joint Mobilization  LAD R hip, grade 5 manip    Soft tissue mobilization  MTPr R hip flexor, tennis ball trigger point release R piriformis              PT Education - 11/01/17 1156    Education Details  SI belt and its use for support, MTPr with tennis ball.     Person(s) Educated  Patient    Methods  Explanation    Comprehension  Verbalized understanding       PT Short Term Goals - 11/01/17 1121      PT SHORT TERM GOAL #1   Title  Pt will be I with HEP    Status  On-going      PT SHORT TERM GOAL #2   Title  Pt will be able to demonstrate and verbalize proper posture in sitting and standing to decrease low back pain to </= 2/10    Status  On-going more likely due to pain, verbalizes proper posture but pain still 3/10      PT SHORT TERM GOAL #3   Title  Pt will verbalize pain control mechanisms to utilize for low back pain to improve quality of life    Status  Achieved        PT Long Term Goals - 09/29/17 1005      PT LONG TERM GOAL #1   Title  Pt will be I with HEP to ensure progress in physical therapy    Time  6    Period  Weeks    Status  New    Target Date  11/10/17      PT LONG TERM GOAL #2   Title  Pt will increase lumbar flexion ROM to >/= 60 degrees, lumbar extension >/= 20 degrees, lumbar sidebending >/= 20 degrees    Time  6    Period  Weeks    Status  New    Target Date  11/10/17      PT LONG TERM GOAL #3   Title  Pt will increase hip strength to >/= 4+/5 to decrease pain and improve functional mobility in the home and community.     Time  6    Period  Weeks    Status  New    Target Date  11/10/17      PT LONG TERM GOAL #4   Title  Pt will be able to stand for >/= 30 minutes with </= 2/10 pain to be able to stand in kitchen  and cook meals.    Time  6    Period  Weeks    Status  New    Target Date  11/10/17      PT LONG TERM GOAL #5   Title  Pt will increase her FOTO score </= 52% limited to improve functional mobility and quality of life    Time  6    Period  Weeks    Status  New    Target Date  11/10/17            Plan - 11/01/17 1158    Clinical Impression  Statement  Pt reports decreased pain in back today. She states she performed some deep cleaning in her home yesterday, but she had to take frequent breaks. Today's treatment focused on MTPr of R hip flexor and R piriformis. Pt instructed how to perform self-MTPr of piriformis with tennis ball. Trialed an SI belt on the pt, she reports decreased pain in standing and not much change when walking; however, at end of session when belt was removed, pt reported increased pain when walking as compared to when she was wearing the belt. Plan on reviewing posture next session, especially standing posture.     PT Treatment/Interventions  ADLs/Self Care Home Management;Electrical Stimulation;Traction;Moist Heat;Ultrasound;DME Instruction;Gait training;Stair training;Functional mobility training;Therapeutic activities;Therapeutic exercise;Balance training;Neuromuscular re-education;Patient/family education;Manual techniques;Passive range of motion;Dry needling;Energy conservation;Taping    PT Next Visit Plan  Review/update HEP-, assess SI joint (pt reporting pain in sacrum), glute strengthening, core strengthening and activation, MET prn, e-stim IFC prn, posture review    PT Home Exercise Plan  lower trunk rotations, pelvic tilts, hip flexor stretch, glute sets, reinfoced hip flexor stetch and added knee flexion during.     Consulted and Agree with Plan of Care  Patient       Patient will benefit from skilled therapeutic intervention in order to improve the following deficits and impairments:  Abnormal gait, Pain, Decreased mobility, Postural dysfunction, Decreased activity tolerance, Decreased endurance, Decreased range of motion, Decreased strength, Decreased balance, Difficulty walking, Impaired flexibility, Hypomobility  Visit Diagnosis: Chronic midline low back pain without sciatica  Decreased ROM of lumbar spine  Chronic bilateral low back pain with right-sided sciatica  Muscle weakness  (generalized)  Abnormal posture     Problem List Patient Active Problem List   Diagnosis Date Noted  . Ingrown toenail 08/22/2017  . Type II diabetes mellitus ([place])   . Sciatica   . PONV (postoperative nausea and vomiting)   . Peripheral neuropathy   . Osteoarthritis   . Migraines   . Irritable bowel syndrome   . Hypertension   . History of right mastectomy   . History of kidney stones   . H/O hiatal hernia   . GERD (gastroesophageal reflux disease)   . Family history of anesthesia complication   . Chronic lower back pain   . Breast cancer ([place])   . Arthritis   . Diabetes mellitus type 2, uncontrolled, with complications ([place])   . Lower extremity edema   . Positive D dimer 03/05/2017  . Elevated d-dimer   . Elevated troponin   . Chronic constipation 06/23/2015  . Obesity 06/12/2015  . Chronic diastolic heart failure ([place]) 11/17/2014  . HNP (herniated nucleus pulposus), lumbar 08/22/2014    Class: Chronic  . Herniated nucleus pulposus, L2-3 right 02/04/2014    Class: Acute  . Urinary retention 05/04/2012    Class: Temporary  . Spondylolisthesis of lumbar region 04/30/2012    Class:  Chronic  . Lumbar stenosis with neurogenic claudication 04/30/2012    Class: Chronic  . Chest pain 03/20/2012  . Anginal pain ([place]) 03/19/2012  . Right shoulder pain 12/07/2010  . ABDOMINAL PAIN, LOWER 09/21/2009  . BACK PAIN 03/18/2009  . SYNCOPE-CAROTID SINUS 09/16/2008  . Essential hypertension 08/05/2008  . Hypothyroidism 08/23/2006  . Diabetes with neurologic complications ([place]) 09/81/1914  . Diabetic neuropathy ([place]) 08/23/2006  . CARDIOMYOPATHY 08/23/2006  . GERD 08/23/2006  . OSTEOARTHRITIS 08/23/2006  . DDD (degenerative disc disease), cervical 08/23/2006  . [place] DISEASE, LUMBAR 08/23/2006  . Headache(784.0) 08/23/2006  . BREAST CANCER, HX OF 08/23/2006  . HYPERPARATHYROIDISM, HX OF 08/23/2006  . RENAL CALCULUS, HX OF 08/23/2006   Worthy Flank, SPT 11/01/17  12:06 PM   [facility] [address] [place], [place], [address] Phone: [phone]   Fax:  [phone]  Name: [name] MRN: [id number] Date of Birth: [birth year]       PHYSICAL THERAPY DISCHARGE SUMMARY  Visits from Start of Care: 7  Current functional level related to goals / functional outcomes: See goals   Remaining deficits: unknown   Education / Equipment:  HEP Plan: Patient agrees to discharge.  Patient goals were partially met. Patient is being discharged due to not returning since the last visit.  ?????         Kristoffer Leamon PT, DPT, LAT, ATC  01/02/18  3:33 PM

## 2017-12-21 IMAGING — CT CT ANGIO CHEST
2 of 9 series · 19 of 46 positions shown · IV contrast (OMNI)
Comparison: Chest radiographs dated 08/30/2015

CLINICAL DATA: Chest pain, shortness of breath, history of right
mastectomy

EXAM:
CT ANGIOGRAPHY CHEST WITH CONTRAST
TECHNIQUE: Multidetector CT imaging of the chest was performed using the
standard protocol during bolus administration of intravenous
contrast. Multiplanar CT image reconstructions and MIPs were
obtained to evaluate the vascular anatomy.
CONTRAST:  100 mL Isovue 370 IV

[Series 5: thins · axial · 0.85mm/px · z∈[-134,+118]mm · 16 of 284 slices shown]
[im 16/284  lung]
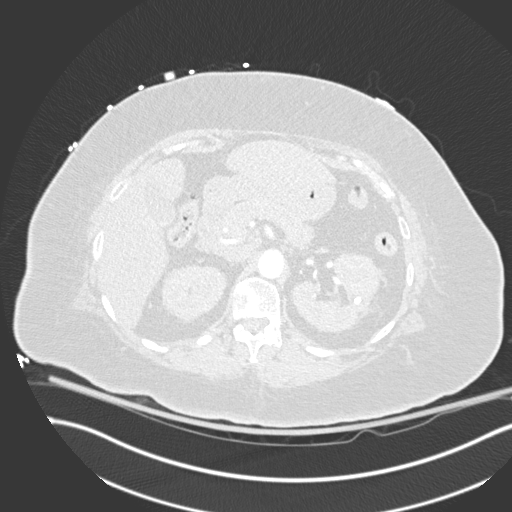
[im 32/284  soft-tissue]
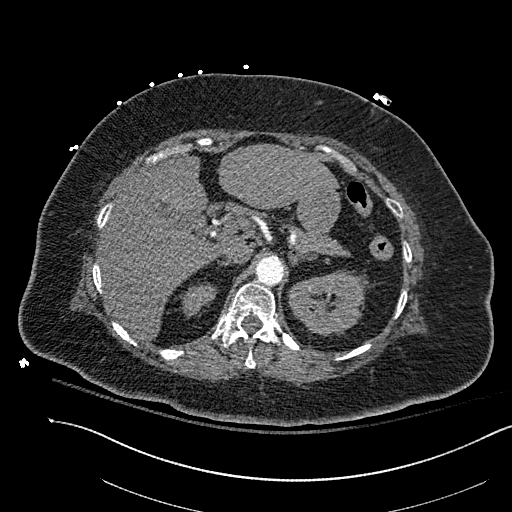
[im 48/284  lung]
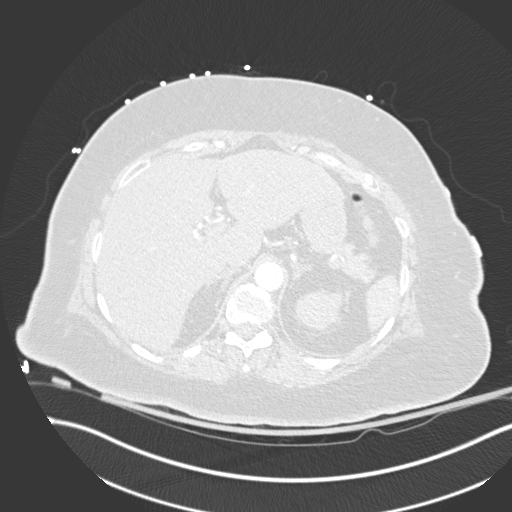
[im 63/284  soft-tissue]
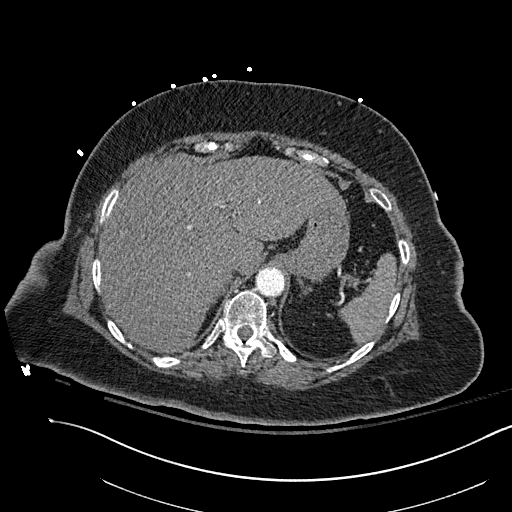
[im 79/284  lung]
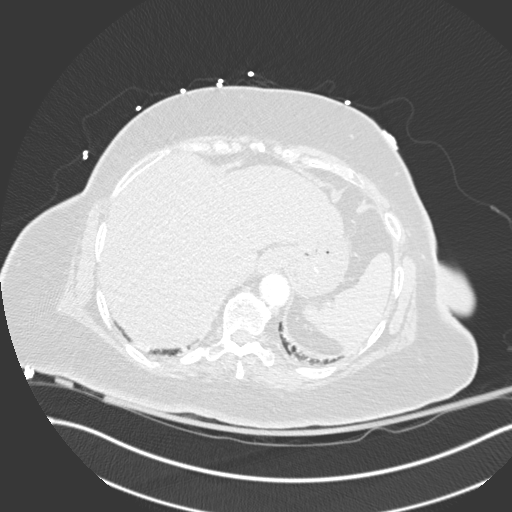
[im 95/284  soft-tissue]
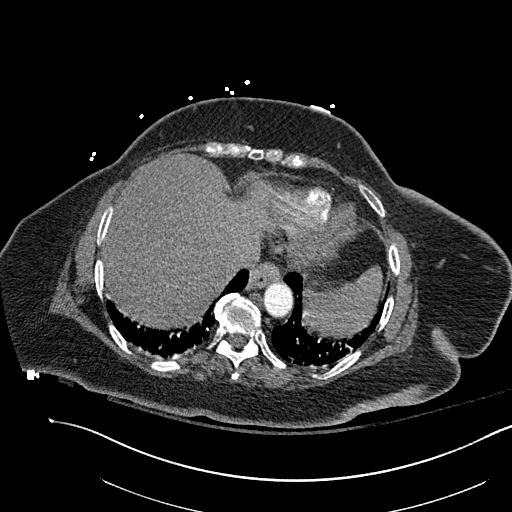
[im 111/284  lung]
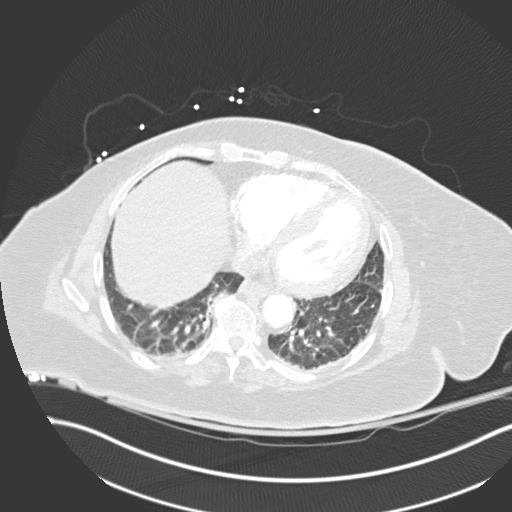
[im 126/284  soft-tissue]
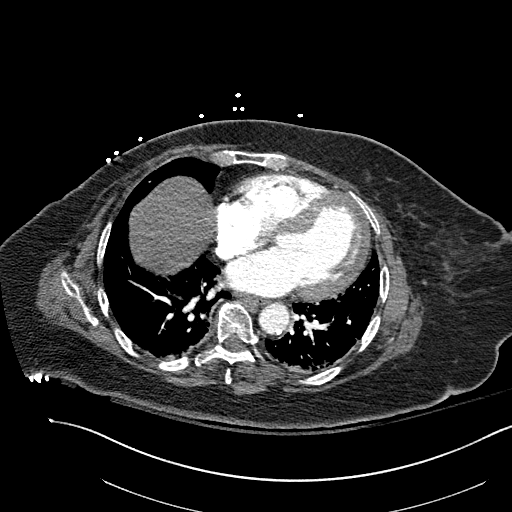
[im 158/284  lung]
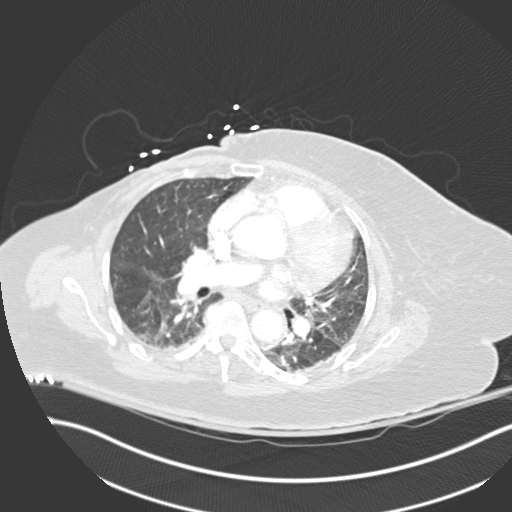
[im 173/284  soft-tissue]
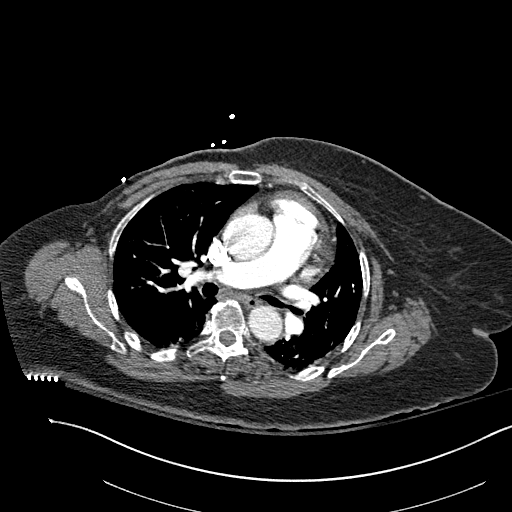
[im 189/284  lung]
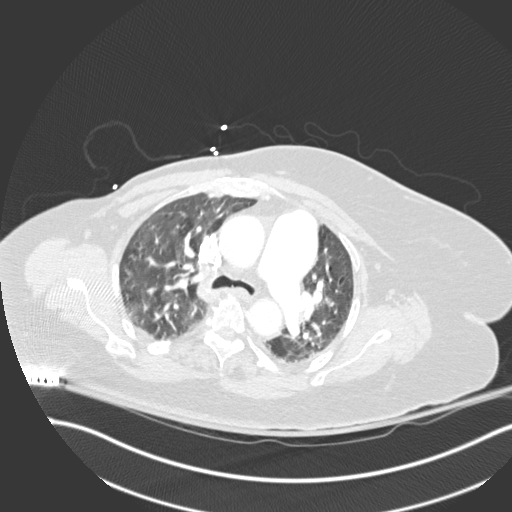
[im 205/284  soft-tissue]
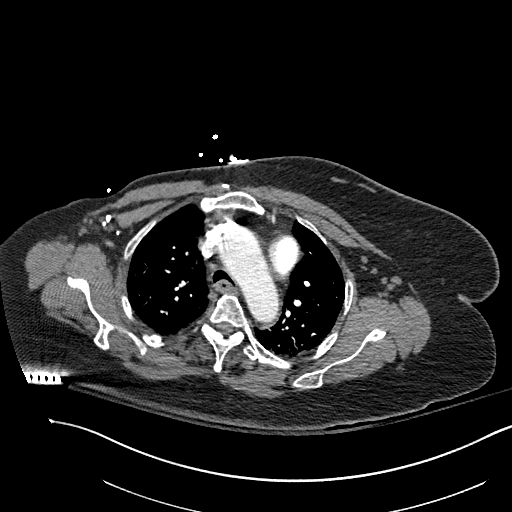
[im 221/284  lung]
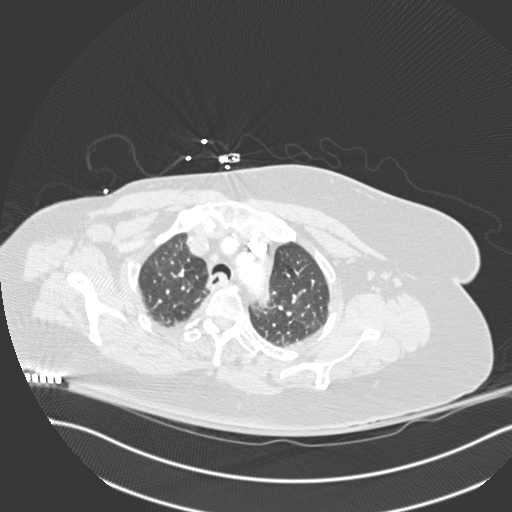
[im 236/284  soft-tissue]
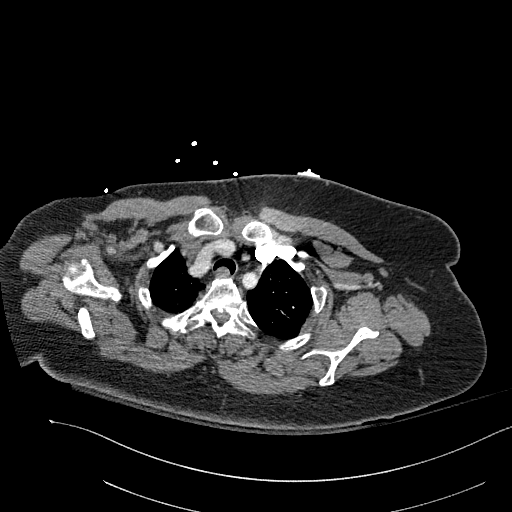
[im 252/284  lung]
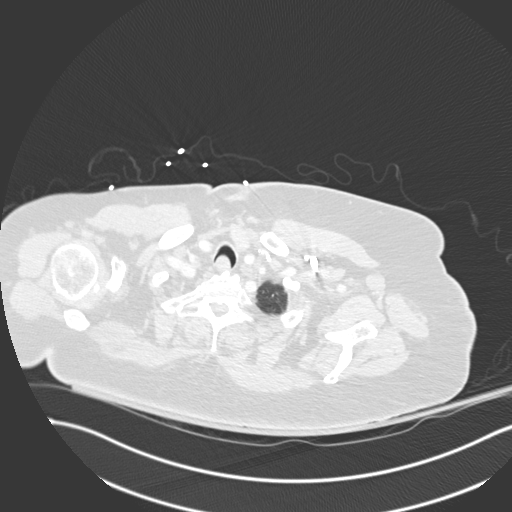
[im 268/284  soft-tissue]
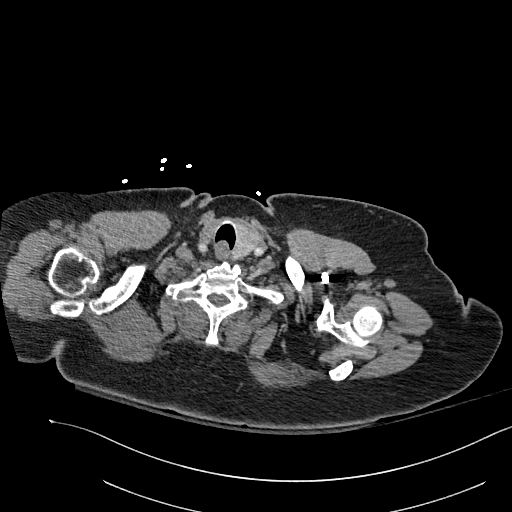

[Series 7: coronal mpr · coronal · 0.59mm/px · 3 of 151 slices shown]
[im 38/151  soft-tissue]
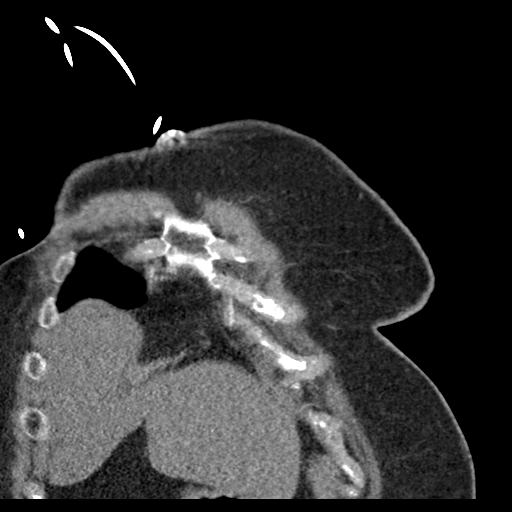
[im 76/151  soft-tissue]
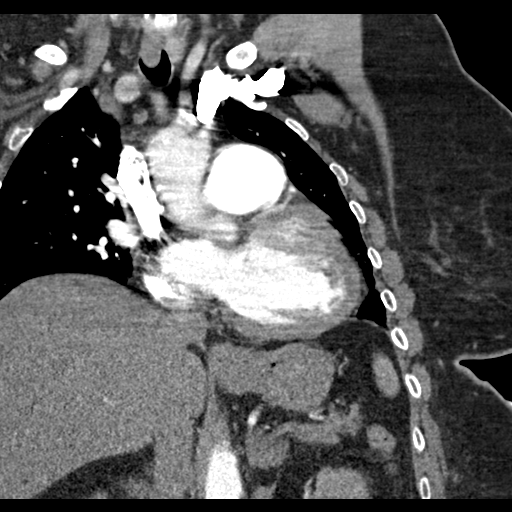
[im 113/151  soft-tissue]
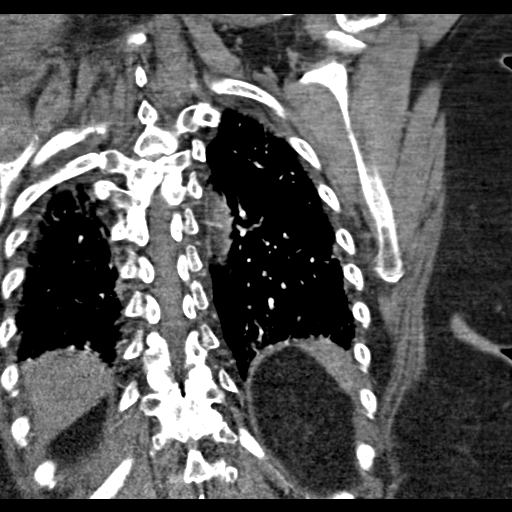

[19 of 46 positions shown; findings below may reference images not displayed]

FINDINGS: No evidence of pulmonary embolism.

Mediastinum/Nodes: Heart is top-normal size. No pericardial
effusion.

No evidence of thoracic aortic aneurysm.

Enlargement of the main pulmonary artery, measuring 4.0 cm, raising
the possibility of pulmonary arterial hypertension.

No suspicious mediastinal lymphadenopathy.

Right thyroid is surgically absent.  Left thyroid is unremarkable.

Lungs/Pleura: Evaluation of the lung parenchyma is constrained by
respiratory motion.

Scattered ground-glass opacity/mosaic attenuation, suggesting air
trapping. Superimposed lower lobe atelectasis.

No focal consolidation.

No pleural effusion or pneumothorax.

Upper abdomen: Visualized upper abdomen is notable for moderate
hepatic steatosis.

Musculoskeletal: Status post right mastectomy.

Degenerative changes of the visualized thoracolumbar spine.

Review of the MIP images confirms the above findings.
IMPRESSION: No evidence of pulmonary embolism.

Enlargement of the main pulmonary artery global raising the
possibility of pulmonary arterial hypertension.

Status post right mastectomy. No findings to suggest metastatic
disease.

Moderate hepatic steatosis.

Additional ancillary findings as above.

## 2017-12-21 IMAGING — DX DG CHEST 2V
2 series · 2 of 2 positions shown · non-contrast
Comparison: 11/16/2014

CLINICAL DATA: Chest pain, onset at [DATE] p.m.. History of diabetes
and hypertension. Shortness of breath.

EXAM:
CHEST  2 VIEW

[chest lat]
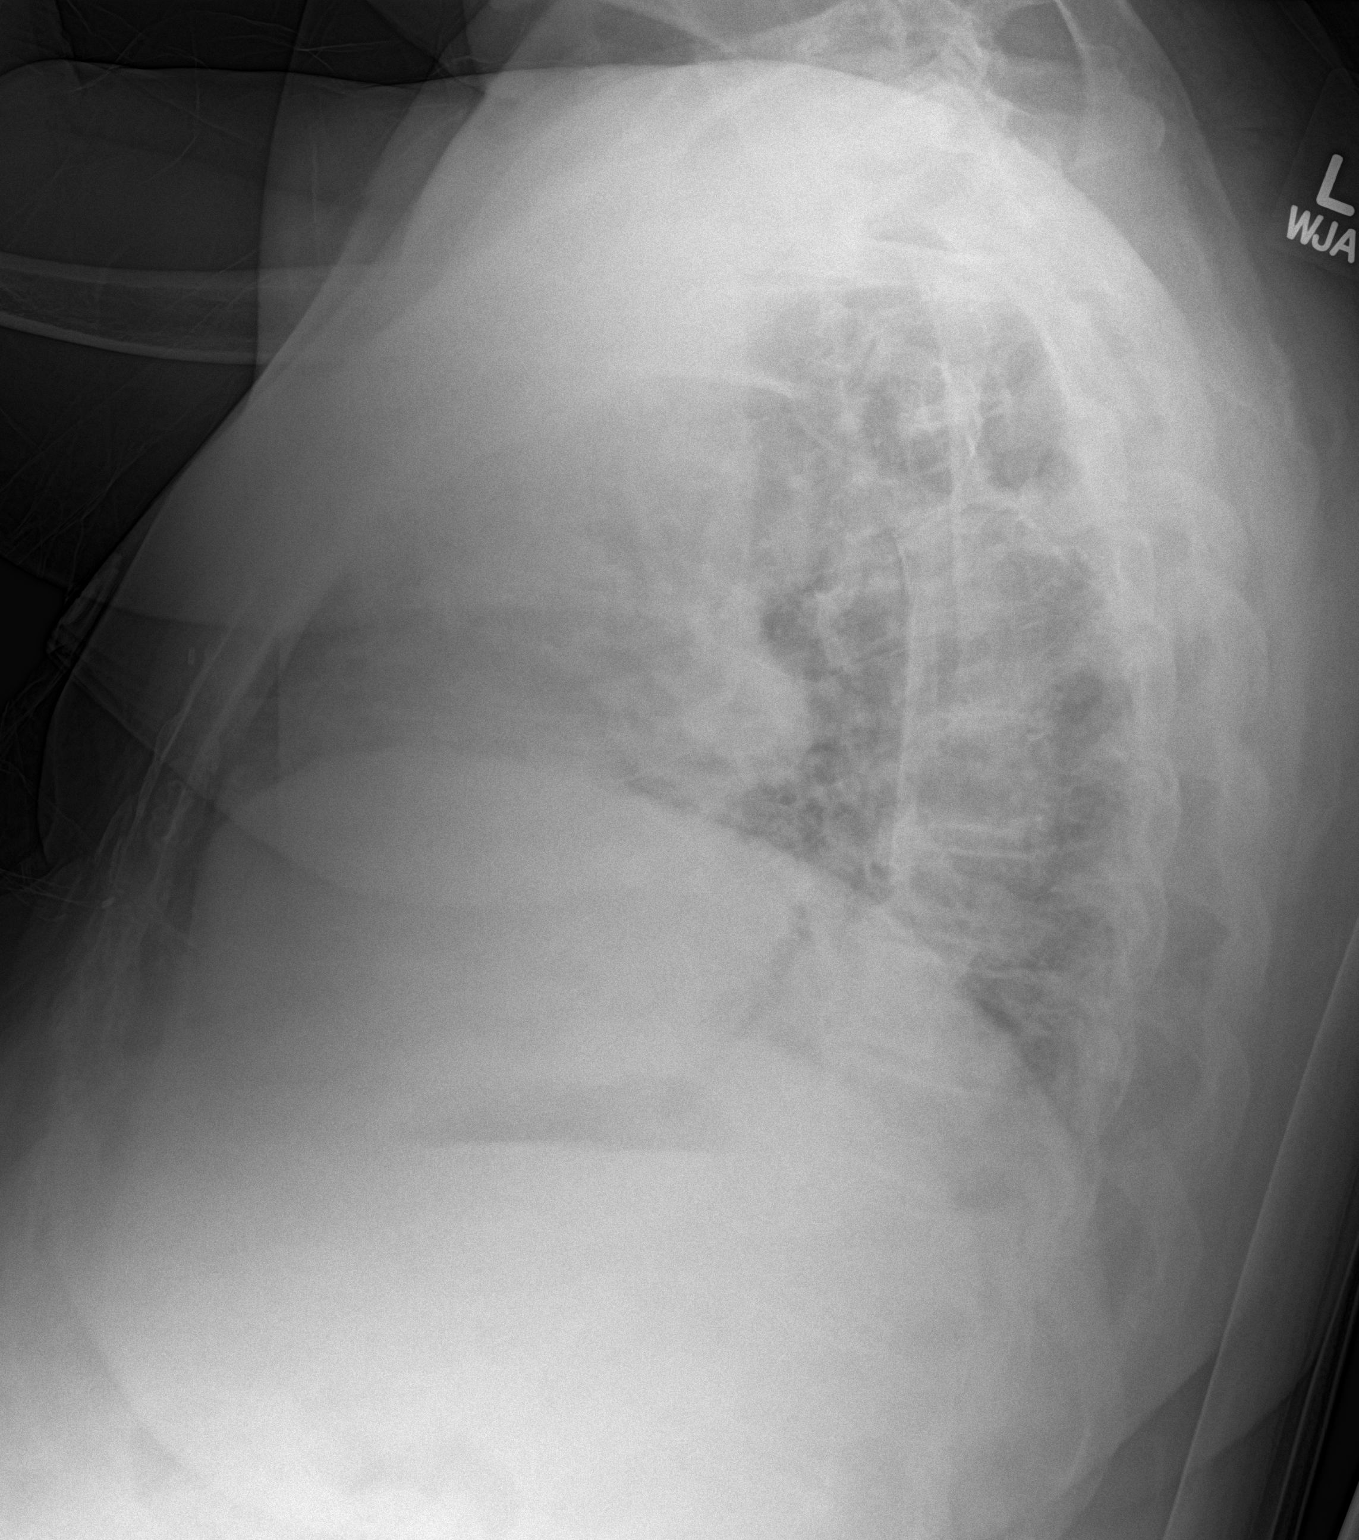

[chest ap]
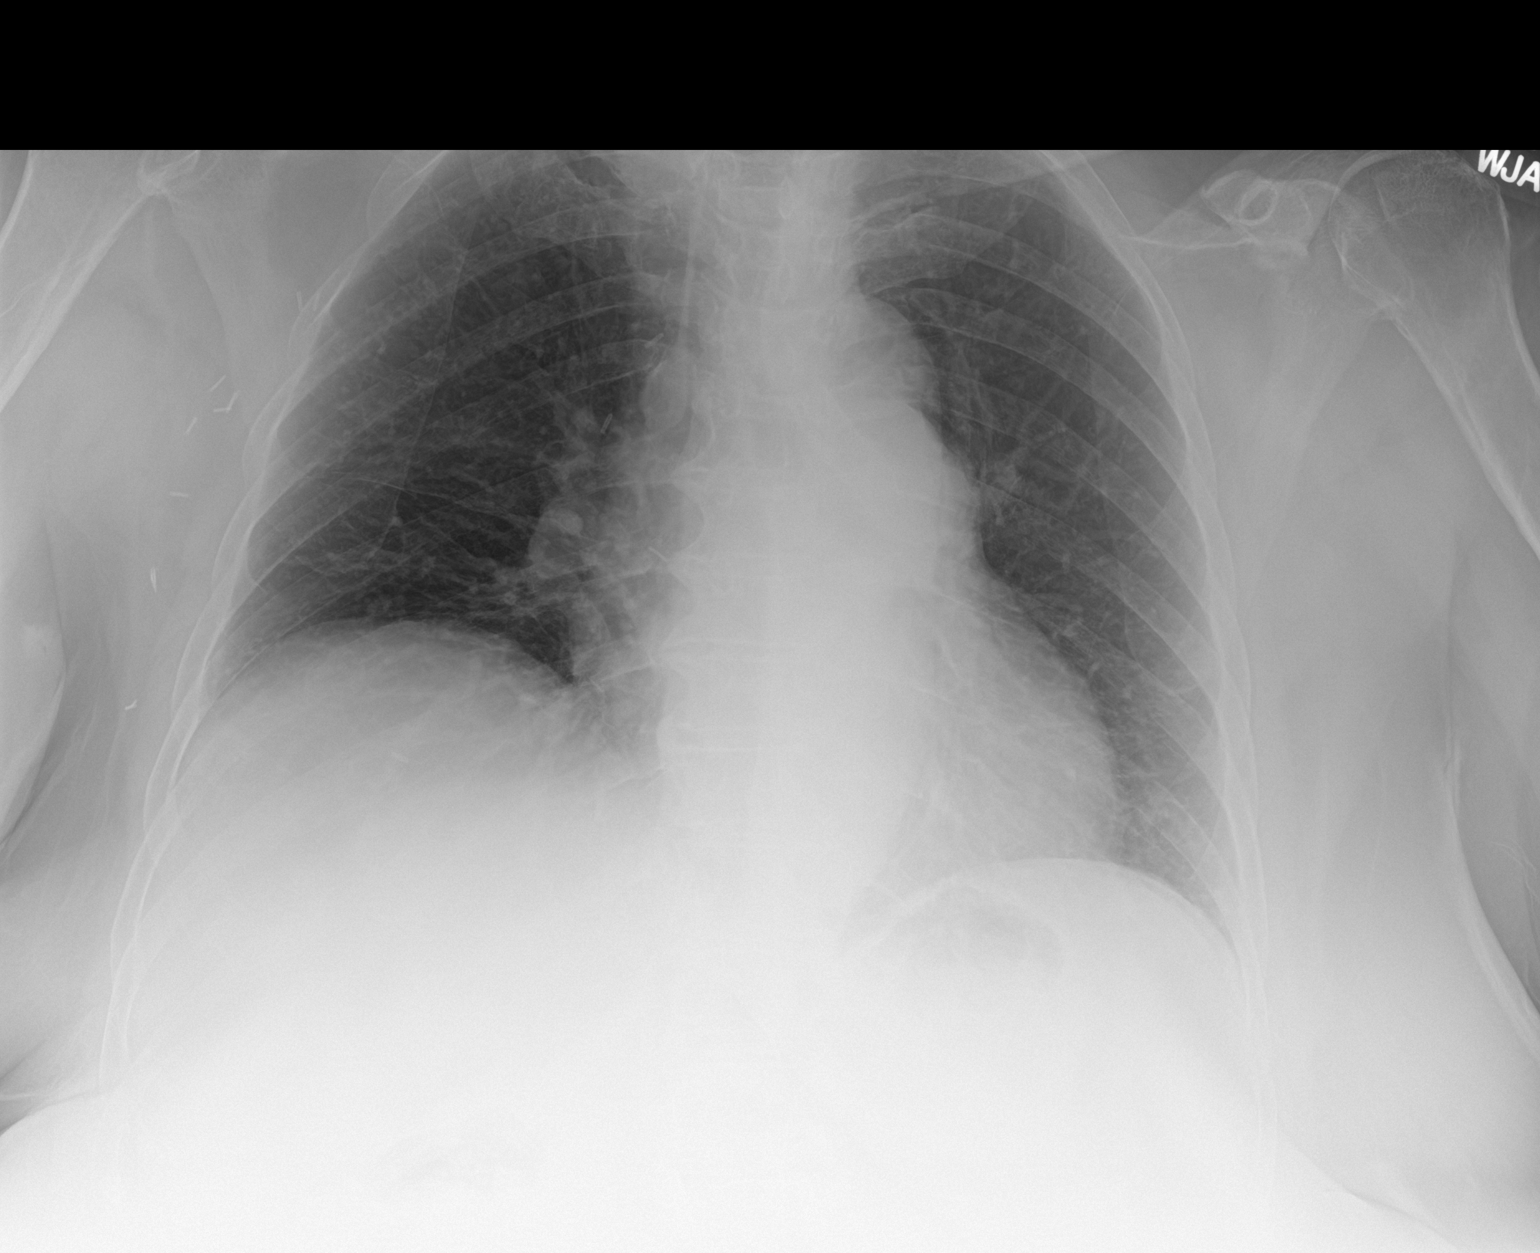

[2 of 2 positions shown; findings below may reference images not displayed]

FINDINGS: Normal heart size and pulmonary vascularity. No focal airspace
disease or consolidation in the lungs. No blunting of costophrenic
angles. No pneumothorax. Mediastinal contours appear intact.
Postoperative changes consistent with right mastectomy and surgical
clips in the right axilla. Degenerative changes in the spine and
shoulders.
IMPRESSION: No active cardiopulmonary disease.

## 2017-12-25 ENCOUNTER — Emergency Department (HOSPITAL_BASED_OUTPATIENT_CLINIC_OR_DEPARTMENT_OTHER)
Admission: EM | Admit: 2017-12-25 | Discharge: 2017-12-26 | Disposition: A | Discharge status: Home / Self Care | Payer: Medicare HMO | Attending: Emergency Medicine | Admitting: Emergency Medicine

## 2017-12-25 ENCOUNTER — Emergency Department (HOSPITAL_BASED_OUTPATIENT_CLINIC_OR_DEPARTMENT_OTHER): Payer: Medicare HMO

## 2017-12-25 ENCOUNTER — Encounter (HOSPITAL_BASED_OUTPATIENT_CLINIC_OR_DEPARTMENT_OTHER): Payer: Self-pay

## 2017-12-25 ENCOUNTER — Other Ambulatory Visit: Payer: Self-pay

## 2017-12-25 DIAGNOSIS — R110 Nausea: Secondary | ICD-10-CM | POA: Insufficient documentation

## 2017-12-25 DIAGNOSIS — R0789 Other chest pain: Secondary | ICD-10-CM | POA: Diagnosis not present

## 2017-12-25 DIAGNOSIS — Z853 Personal history of malignant neoplasm of breast: Secondary | ICD-10-CM | POA: Diagnosis not present

## 2017-12-25 DIAGNOSIS — Z96651 Presence of right artificial knee joint: Secondary | ICD-10-CM | POA: Diagnosis not present

## 2017-12-25 DIAGNOSIS — K8050 Calculus of bile duct without cholangitis or cholecystitis without obstruction: Secondary | ICD-10-CM | POA: Diagnosis not present

## 2017-12-25 DIAGNOSIS — I110 Hypertensive heart disease with heart failure: Secondary | ICD-10-CM | POA: Diagnosis not present

## 2017-12-25 DIAGNOSIS — I5032 Chronic diastolic (congestive) heart failure: Secondary | ICD-10-CM | POA: Insufficient documentation

## 2017-12-25 DIAGNOSIS — E1140 Type 2 diabetes mellitus with diabetic neuropathy, unspecified: Secondary | ICD-10-CM | POA: Insufficient documentation

## 2017-12-25 DIAGNOSIS — Z79899 Other long term (current) drug therapy: Secondary | ICD-10-CM | POA: Diagnosis not present

## 2017-12-25 DIAGNOSIS — N281 Cyst of kidney, acquired: Secondary | ICD-10-CM | POA: Diagnosis not present

## 2017-12-25 DIAGNOSIS — E039 Hypothyroidism, unspecified: Secondary | ICD-10-CM | POA: Diagnosis not present

## 2017-12-25 DIAGNOSIS — Z794 Long term (current) use of insulin: Secondary | ICD-10-CM | POA: Diagnosis not present

## 2017-12-25 DIAGNOSIS — Z91040 Latex allergy status: Secondary | ICD-10-CM | POA: Insufficient documentation

## 2017-12-25 DIAGNOSIS — R1011 Right upper quadrant pain: Secondary | ICD-10-CM | POA: Diagnosis not present

## 2017-12-25 DIAGNOSIS — R079 Chest pain, unspecified: Secondary | ICD-10-CM | POA: Insufficient documentation

## 2017-12-25 DIAGNOSIS — R109 Unspecified abdominal pain: Secondary | ICD-10-CM | POA: Diagnosis present

## 2017-12-25 DIAGNOSIS — K8020 Calculus of gallbladder without cholecystitis without obstruction: Secondary | ICD-10-CM | POA: Diagnosis not present

## 2017-12-25 LAB — URINALYSIS, ROUTINE W REFLEX MICROSCOPIC
Bilirubin Urine: NEGATIVE
Ketones, ur: NEGATIVE mg/dL
Leukocytes, UA: NEGATIVE
Nitrite: NEGATIVE
PH: 7 (ref 5.0–8.0)
Protein, ur: 100 mg/dL — AB
SPECIFIC GRAVITY, URINE: 1.015 (ref 1.005–1.030)

## 2017-12-25 LAB — COMPREHENSIVE METABOLIC PANEL
ALK PHOS: 55 U/L (ref 38–126)
ALT: 22 U/L (ref 0–44)
AST: 24 U/L (ref 15–41)
Albumin: 3.6 g/dL (ref 3.5–5.0)
Anion gap: 10 (ref 5–15)
BUN: 19 mg/dL (ref 8–23)
CHLORIDE: 99 mmol/L (ref 98–111)
CO2: 30 mmol/L (ref 22–32)
CREATININE: 1.02 mg/dL — AB (ref 0.44–1.00)
Calcium: 10.8 mg/dL — ABNORMAL HIGH (ref 8.9–10.3)
GFR calc Af Amer: >60 mL/min (ref >60)
GFR calc non Af Amer: 55 mL/min — ABNORMAL LOW (ref >60)
GLUCOSE: 261 mg/dL — AB (ref 70–99)
Potassium: 3.8 mmol/L (ref 3.5–5.1)
SODIUM: 139 mmol/L (ref 135–145)
Total Bilirubin: 0.3 mg/dL (ref 0.3–1.2)
Total Protein: 7.4 g/dL (ref 6.5–8.1)

## 2017-12-25 LAB — URINALYSIS, MICROSCOPIC (REFLEX)

## 2017-12-25 LAB — CBC WITH DIFFERENTIAL/PLATELET
BASOS PCT: 1 %
Basophils Absolute: 0 10*3/uL (ref 0.0–0.1)
EOS ABS: 0.2 10*3/uL (ref 0.0–0.7)
Eosinophils Relative: 3 %
HCT: 42.7 % (ref 36.0–46.0)
HEMOGLOBIN: 14 g/dL (ref 12.0–15.0)
Lymphocytes Relative: 34 %
Lymphs Abs: 2.5 10*3/uL (ref 0.7–4.0)
MCH: 29.5 pg (ref 26.0–34.0)
MCHC: 32.8 g/dL (ref 30.0–36.0)
MCV: 90.1 fL (ref 78.0–100.0)
Monocytes Absolute: 0.5 10*3/uL (ref 0.1–1.0)
Monocytes Relative: 7 %
NEUTROS PCT: 55 %
Neutro Abs: 4.1 10*3/uL (ref 1.7–7.7)
Platelets: 243 10*3/uL (ref 150–400)
RBC: 4.74 MIL/uL (ref 3.87–5.11)
RDW: 13.3 % (ref 11.5–15.5)
WBC: 7.4 10*3/uL (ref 4.0–10.5)

## 2017-12-25 LAB — LIPASE, BLOOD: Lipase: 45 U/L (ref 11–51)

## 2017-12-25 LAB — TROPONIN I
TROPONIN I: 0.03 ng/mL — AB (ref <0.03)
Troponin I: <0.03 ng/mL (ref <0.03)

## 2017-12-25 MED ORDER — INSULIN ASPART PROT & ASPART (70-30 MIX) 100 UNIT/ML ~~LOC~~ SUSP
SUBCUTANEOUS | Status: AC
  Filled 2017-12-25: qty 10

## 2017-12-25 MED ORDER — FAMOTIDINE IN NACL 20-0.9 MG/50ML-% IV SOLN
20.0000 mg | Freq: Once | INTRAVENOUS | Status: AC
  Administered 2017-12-25: 20 mg via INTRAVENOUS
  Filled 2017-12-25: qty 50

## 2017-12-25 MED ORDER — PANTOPRAZOLE SODIUM 40 MG PO TBEC
40.0000 mg | DELAYED_RELEASE_TABLET | Freq: Once | ORAL | Status: AC
  Administered 2017-12-25: 40 mg via ORAL
  Filled 2017-12-25: qty 1

## 2017-12-25 MED ORDER — IOPAMIDOL (ISOVUE-300) INJECTION 61%
100.0000 mL | Freq: Once | INTRAVENOUS | Status: AC | PRN
  Administered 2017-12-25: 100 mL via INTRAVENOUS

## 2017-12-25 MED ORDER — INSULIN ASPART PROT & ASPART (70-30 MIX) 100 UNIT/ML ~~LOC~~ SUSP
30.0000 [IU] | Freq: Once | SUBCUTANEOUS | Status: AC
  Administered 2017-12-25: 30 [IU] via SUBCUTANEOUS

## 2017-12-25 MED ORDER — SODIUM CHLORIDE 0.9 % IV BOLUS
1000.0000 mL | Freq: Once | INTRAVENOUS | Status: AC
  Administered 2017-12-25: 1000 mL via INTRAVENOUS

## 2017-12-25 MED ORDER — HYDROCODONE-ACETAMINOPHEN 5-325 MG PO TABS: 1.0000 | ORAL_TABLET | Freq: Four times a day (QID) | ORAL | 0 refills | Status: DC | PRN

## 2017-12-25 MED ORDER — ESOMEPRAZOLE MAGNESIUM 40 MG PO CPDR: 40.0000 mg | DELAYED_RELEASE_CAPSULE | Freq: Every day | ORAL | 0 refills | Status: DC

## 2017-12-25 MED ORDER — GI COCKTAIL ~~LOC~~
30.0000 mL | Freq: Once | ORAL | Status: AC
  Administered 2017-12-25: 30 mL via ORAL
  Filled 2017-12-25: qty 30

## 2017-12-25 MED ORDER — FENTANYL CITRATE (PF) 100 MCG/2ML IJ SOLN
50.0000 ug | Freq: Once | INTRAMUSCULAR | Status: AC
  Administered 2017-12-25: 50 ug via INTRAVENOUS
  Filled 2017-12-25: qty 2

## 2017-12-25 NOTE — ED Triage Notes (Signed)
C/o CP x 1 week-NAD-to triage in w/c

## 2017-12-25 NOTE — ED Notes (Signed)
Attempted IV stick x 1. Unsuccessful- EDP at bedside to attempt due to previous hx.

## 2017-12-25 NOTE — Discharge Instructions (Addendum)
You have gallstones. Take tylenol for pain.  Follow-up with surgeon if your symptoms persist.  Take vicodin for severe pain only if Tylenol does not help. Please ensure that you do not take more than 4000mg  of acetaminophen (Tylenol) and a 24-hour period.  See cardiology if you have persistent chest pain.  Regarding your likely gastritis/esophagitis, you have been provided with information regarding certain foods that may worsen your symptoms.  Follow-up with gastroenterology for further evaluation. You need to also take nexium daily.  Regarding your constipation, you were noted to have elevated calcium level.  Please stop taking any calcium-containing products including Tums or Maalox.  Please stop taking Pepto-Bismol which can also constipate you.  Please note that the Vicodin above may also constipate you.  Return to ER if you have worse chest pain, abdominal pain, vomiting or you develop fever.

## 2017-12-25 NOTE — ED Notes (Signed)
Pt made aware of the need of urine sample

## 2017-12-25 NOTE — ED Provider Notes (Signed)
[facility] EMERGENCY DEPARTMENT Provider Note   CSN: [id number] Arrival date & time: 12/25/17  1901     History   Chief Complaint Chief Complaint  Patient presents with  . Chest Pain    HPI [name] is a 67 y.o. female hx of cardiomyopathy, GERD, HTN, IBS, here presenting with abdominal pain, nausea, chest pain.  Patient states that she has been having intermittent chest pain in the epigastrium for the last week or so.  Patient states that it is worse after she eats and worse at night.  He also has a nonproductive cough but no fever.  Patient states that often times the last all day and it is a burning sensation in her chest. Of note, patient was admitted to the hospital about a year ago and had a CTA that showed no PE and was seen by cardiology and was noted to have a normal Myoview recently and likely had non-cardiac chest pain no further cardiac work-up was recommended.  Patient states that the abdominal pain and chest pain got worse over the last week so came here for evaluation.  She states that she felt nauseated and had no vomiting.  Patient denies any urinary symptoms.  The history is provided by the patient.    Past Medical History:  Diagnosis Date  . Anginal pain ([place]) 03/19/2012   saw Dr. Cathie Olden .. she thinks its more related to stomach issues  . Arthritis    "all over; mostly in my back" (03/20/2012)  . Breast cancer ([place])    "right" (03/20/2012)  . Cardiomyopathy   . Chest pain 06/2005   Hospitalized, dystolic dysfunction  . Chronic lower back pain    "have 2 herniated discs; going to have to have a fusion" (03/20/2012)  . Family history of anesthesia complication    "father has nausea and vomiting"  . GERD (gastroesophageal reflux disease)   . H/O hiatal hernia   . History of kidney stones   . History of right mastectomy   . Hypertension    "patient states never had HTN, takes Hyzaar for heart  . Hypothyroidism   . Irritable bowel syndrome    . Migraines    "it's been a long time since I've had one" (03/20/2012)  . Osteoarthritis   . Peripheral neuropathy   . PONV (postoperative nausea and vomiting)   . Sciatica   . Type II diabetes mellitus [facility])     Patient Active Problem List   Diagnosis Date Noted  . Ingrown toenail 08/22/2017  . Type II diabetes mellitus ([place])   . Sciatica   . PONV (postoperative nausea and vomiting)   . Peripheral neuropathy   . Osteoarthritis   . Migraines   . Irritable bowel syndrome   . Hypertension   . History of right mastectomy   . History of kidney stones   . H/O hiatal hernia   . GERD (gastroesophageal reflux disease)   . Family history of anesthesia complication   . Chronic lower back pain   . Breast cancer ([place])   . Arthritis   . Diabetes mellitus type 2, uncontrolled, with complications ([place])   . Lower extremity edema   . Positive D dimer 03/05/2017  . Elevated d-dimer   . Elevated troponin   . Chronic constipation 06/23/2015  . Obesity 06/12/2015  . Chronic diastolic heart failure ([place]) 11/17/2014  . HNP (herniated nucleus pulposus), lumbar 08/22/2014    Class: Chronic  . Herniated nucleus pulposus, L2-3 right  02/04/2014    Class: Acute  . Urinary retention 05/04/2012    Class: Temporary  . Spondylolisthesis of lumbar region 04/30/2012    Class: Chronic  . Lumbar stenosis with neurogenic claudication 04/30/2012    Class: Chronic  . Chest pain 03/20/2012  . Anginal pain ([place]) 03/19/2012  . Right shoulder pain 12/07/2010  . ABDOMINAL PAIN, LOWER 09/21/2009  . BACK PAIN 03/18/2009  . SYNCOPE-CAROTID SINUS 09/16/2008  . Essential hypertension 08/05/2008  . Hypothyroidism 08/23/2006  . Diabetes with neurologic complications ([place]) 13/11/6576  . Diabetic neuropathy ([place]) 08/23/2006  . CARDIOMYOPATHY 08/23/2006  . GERD 08/23/2006  . OSTEOARTHRITIS 08/23/2006  . DDD (degenerative disc disease), cervical 08/23/2006  . [place] DISEASE, LUMBAR 08/23/2006  . Headache(784.0)  08/23/2006  . BREAST CANCER, HX OF 08/23/2006  . HYPERPARATHYROIDISM, HX OF 08/23/2006  . RENAL CALCULUS, HX OF 08/23/2006    Past Surgical History:  Procedure Laterality Date  . ABDOMINAL HYSTERECTOMY  1993  . adenosin cardiolite  07/2005   (+) wall motion abnormality  . AXILLARY LYMPH NODE DISSECTION  2003   squamous cell cancer  . BACK SURGERY    . CARDIAC CATHETERIZATION  ? 2005 / 2007  . CT abdomen and pelvis  02/2010   Same  . ESOPHAGOGASTRODUODENOSCOPY  2005   GERD  . KNEE ARTHROSCOPY  1990's   "right" (03/20/2012)  . LUMBAR FUSION N/A 08/22/2014   Procedure: Right L2-3 and right L1-2 transforaminal lumbar interbody fusion with pedicle screws, rods, sleeves, cages, local bone graft, Vivigen, cancellous chips;  Surgeon: Jessy Oto, MD;  Location: [facility];  Service: Orthopedics;  Laterality: N/A;  . LUMBAR LAMINECTOMY/DECOMPRESSION MICRODISCECTOMY N/A 02/04/2014   Procedure: RIGHT L2-3 MICRODISCECTOMY ;  Surgeon: Jessy Oto, MD;  Location: [employer];  Service: Orthopedics;  Laterality: N/A;  . MASTECTOMY WITH AXILLARY LYMPH NODE DISSECTION  12/2003   "right" (03/20/2012)  . POSTERIOR CERVICAL FUSION/FORAMINOTOMY  2001  . SHOULDER ARTHROSCOPY W/ ROTATOR CUFF REPAIR  2003   Left  . THYROIDECTOMY  1977   Right  . TOTAL KNEE ARTHROPLASTY  2000   Right  . [place] of abdomen  07/2005   Fatty liver / kidney stones     OB History   None      Home Medications    Prior to Admission medications   Medication Sig Start Date End Date Taking? Authorizing Provider  acetaminophen (TYLENOL) 500 MG tablet Take 500 mg by mouth every 4 (four) hours as needed for mild pain or headache.    [provider]  Alcohol Swabs (B-D SINGLE USE SWABS REGULAR) PADS Use with insulin injections 05/17/17   Susy Frizzle, MD  aspirin 81 MG tablet Take 81 mg by mouth daily.    [provider]  Blood Glucose Calibration (TRUE METRIX LEVEL 2) Normal SOLN Use as directed 05/17/17    Susy Frizzle, MD  Blood Glucose Monitoring Suppl (TRUE METRIX AIR GLUCOSE METER) w/Device KIT 1 kit by Does not apply route 3 (three) times daily. 05/10/17   Susy Frizzle, MD  Calcium Carb-Cholecalciferol (CALTRATE 600+D3 SOFT) 600-800 MG-UNIT CHEW Chew 1 tablet by mouth 2 (two) times daily after a meal. 04/12/17   Jessy Oto, MD  cephALEXin (KEFLEX) 500 MG capsule Take 1 capsule (500 mg total) by mouth 3 (three) times daily. 08/18/17   Delsa Grana, PA-C  diclofenac (VOLTAREN) 75 MG EC tablet Take 1 tablet (75 mg total) by mouth 2 (two) times daily. 07/20/17   Louanne Skye,  Daleen Bo, MD  doxycycline (VIBRA-TABS) 100 MG tablet Take 1 tablet (100 mg total) by mouth 2 (two) times daily. 08/21/17   [name], DPM  esomeprazole (NEXIUM) 40 MG capsule Take 1 capsule (40 mg total) by mouth daily. 12/25/17   Drenda Freeze, MD  fluticasone (FLONASE) 50 MCG/ACT nasal spray Place 2 sprays into both nostrils daily. 05/10/17   Susy Frizzle, MD  fluticasone (FLONASE) 50 MCG/ACT nasal spray Place 2 sprays into both nostrils daily. 08/22/17   Delsa Grana, PA-C  gabapentin (NEURONTIN) 300 MG capsule Take 1 capsule (300 mg total) by mouth 3 (three) times daily. 06/14/17   Susy Frizzle, MD  glucose blood (TRUE METRIX BLOOD GLUCOSE TEST) test strip Use as instructed 05/10/17   Susy Frizzle, MD  hydrochlorothiazide (HYDRODIURIL) 12.5 MG tablet TAKE 1 TABLET BY MOUTH EVERY DAY 09/21/17   Susy Frizzle, MD  hydrochlorothiazide (MICROZIDE) 12.5 MG capsule Take 1 capsule (12.5 mg total) by mouth daily. 07/07/17   Susy Frizzle, MD  HYDROcodone-acetaminophen (NORCO/VICODIN) 5-325 MG tablet Take 1 tablet by mouth every 6 (six) hours as needed for moderate pain. 12/25/17   Drenda Freeze, MD  insulin NPH-regular Human (NOVOLIN 70/30 RELION) (70-30) 100 UNIT/ML injection 45 u qam & 25 u qpm Patient taking differently: 65 u qam & 30 u qpm 05/10/17   Susy Frizzle, MD  levothyroxine (SYNTHROID,  LEVOTHROID) 100 MCG tablet Take 1 tablet (100 mcg total) by mouth daily. 05/17/17   Susy Frizzle, MD  losartan (COZAAR) 50 MG tablet Take 1 tablet (50 mg total) by mouth daily. 06/26/17   Susy Frizzle, MD  meclizine (ANTIVERT) 12.5 MG tablet 1-2 tab po tid prn dizziness 08/18/17   Delsa Grana, PA-C  metFORMIN (GLUCOPHAGE) 850 MG tablet Take 1 tablet (850 mg total) by mouth 2 (two) times daily with a meal. 09/21/16   Pickard, Cammie Mcgee, MD  Misc Natural Products (LAXATIVE FORMULA) TABS Take 1 tablet by mouth as needed (for BM).    [provider]  ranitidine (ZANTAC) 150 MG tablet Take 1 tablet (150 mg total) by mouth 2 (two) times daily. 05/18/17   Susy Frizzle, MD  TOUJEO SOLOSTAR 300 UNIT/ML SOPN INJECT 160 UNITS INTO THE SKIN EVERY MORNING. 08/29/17   Renato Shin, MD  TRUEPLUS LANCETS 33G MISC Use as directed 05/10/17   Susy Frizzle, MD    Family History Family History  Problem Relation Age of Onset  . Hypertension Mother   . Diabetes Father   . Hypertension Father   . Hypertension Sister   . Hypertension Brother   . Hypertension Brother   . Diabetes Brother   . Diabetes Other        Family Hx. of Diabetes  . Hypertension Other        Family History of HTN  . Deep vein thrombosis Daughter     Social History Social History   Tobacco Use  . Smoking status: Never Smoker  . Smokeless tobacco: Never Used  Substance Use Topics  . Alcohol use: No  . Drug use: No     Allergies   Dilaudid [hydromorphone hcl]; Lipitor [atorvastatin]; Adhesive [tape]; Ampicillin; Latex; and Penicillins   Review of Systems Review of Systems  Cardiovascular: Positive for chest pain.  Gastrointestinal: Positive for abdominal pain and nausea.  All other systems reviewed and are negative.    Physical Exam Updated Vital Signs BP (!) 173/77 (BP Location: Left Arm)  Pulse 61   Temp 98.2 F (36.8 C) (Oral)   Resp 16   Ht '5\' 4"'  (1.626 m)   Wt 93.9 kg   SpO2 98%   BMI  35.53 kg/m   Physical Exam  Constitutional: She is oriented to person, place, and time.  Slightly uncomfortable   HENT:  Head: Normocephalic.  MM slightly dry   Eyes: Pupils are equal, round, and reactive to light.  Neck: Normal range of motion.  Cardiovascular: Normal rate, regular rhythm, intact distal pulses and normal pulses.  Pulmonary/Chest: Effort normal and breath sounds normal.  Abdominal: Soft. Bowel sounds are normal.  + epigastric tenderness, mild RUQ tenderness   Musculoskeletal: Normal range of motion.       Right lower leg: Normal.       Left lower leg: Normal.  Neurological: She is alert and oriented to person, place, and time.  Skin: Capillary refill takes less than 2 seconds.  Psychiatric: She has a normal mood and affect. Her behavior is normal.  Nursing note and vitals reviewed.    ED Treatments / Results  Labs (all labs ordered are listed, but only abnormal results are displayed) Labs Reviewed  COMPREHENSIVE METABOLIC PANEL - Abnormal; Notable for the following components:      Result Value   Glucose, Bld 261 (*)    Creatinine, Ser 1.02 (*)    Calcium 10.8 (*)    GFR calc non Af Amer 55 (*)    All other components within normal limits  URINALYSIS, ROUTINE W REFLEX MICROSCOPIC - Abnormal; Notable for the following components:   Glucose, UA >=500 (*)    Hgb urine dipstick TRACE (*)    Protein, ur 100 (*)    All other components within normal limits  URINALYSIS, MICROSCOPIC (REFLEX) - Abnormal; Notable for the following components:   Bacteria, UA FEW (*)    All other components within normal limits  TROPONIN I - Abnormal; Notable for the following components:   Troponin I 0.03 (*)    All other components within normal limits  TROPONIN I  CBC WITH DIFFERENTIAL/PLATELET  LIPASE, BLOOD    EKG EKG Interpretation  Date/Time:  Monday December 25 2017 19:08:50 EDT Ventricular Rate:  63 PR Interval:  196 QRS Duration: 92 QT Interval:  408 QTC  Calculation: 417 R Axis:   -8 Text Interpretation:  Normal sinus rhythm Cannot rule out Anterior infarct , age undetermined Abnormal ECG No significant change since last tracing Confirmed by Wandra Arthurs [phone]) on 12/25/2017 8:07:36 PM   Radiology Dg Chest 2 View  Result Date: 12/25/2017 CLINICAL DATA:  Central to left-sided chest pain EXAM: CHEST - 2 VIEW COMPARISON:  CXR 03/04/2017 FINDINGS: The heart size and mediastinal contours are within normal limits. Both lungs are clear. Surgical clips project over the right axilla and chest wall. The visualized skeletal structures are unremarkable. IMPRESSION: No active cardiopulmonary disease. Electronically Signed   By: Ashley Royalty M.D.   On: 12/25/2017 19:37   Ct Abdomen Pelvis W Contrast  Result Date: 12/25/2017 CLINICAL DATA:  Abdominal pain with nausea EXAM: CT ABDOMEN AND PELVIS WITH CONTRAST TECHNIQUE: Multidetector CT imaging of the abdomen and pelvis was performed using the standard protocol following bolus administration of intravenous contrast. CONTRAST:  16m ISOVUE-300 IOPAMIDOL (ISOVUE-300) INJECTION 61% COMPARISON:  CT 11/2014, lumbar CT 10/21/2014 FINDINGS: Lower chest: Lung bases demonstrate no acute consolidation or effusion. The heart size is within normal limits. Partially visualized right mastectomy changes Hepatobiliary: Steatosis. Small amount  of layering stone blood or punctate stones in the gallbladder. No biliary dilatation. Pancreas: Unremarkable. No pancreatic ductal dilatation or surrounding inflammatory changes. Spleen: Normal in size without focal abnormality. Adrenals/Urinary Tract: Adrenal glands are normal. No hydronephrosis. 6 mm calcification mid left kidney with adjacent cortical thinning and scarring. 3 mm stone mid right kidney. Parapelvic cysts in the lower left kidney. Bladder is normal Stomach/Bowel: Stomach is nonenlarged. No dilated small bowel. Negative appendix. No colon wall thickening. Vascular/Lymphatic: Aortic  atherosclerosis, mild. No aneurysm. No significantly enlarged lymph nodes Reproductive: Status post hysterectomy. No adnexal masses. Other: No free air or free fluid.  Small fat in the umbilical region Musculoskeletal: Transitional anatomy of the lumbar spine. Posterior stabilization rods and fixating screws L2 through S1 assuming S1 transitional segment. Superior endplate breach of the fixating screws at L2 with chronic superior endplate deformity. IMPRESSION: 1. No CT evidence for acute intra-abdominal or pelvic abnormality. 2. Steatosis 3. Punctate stones or small amount of layering sludge in the gallbladder 4. Intrarenal calculi bilaterally Electronically Signed   By: Donavan Foil M.D.   On: 12/25/2017 22:30    Procedures Procedures (including critical care time)  Angiocath insertion Performed by: Wandra Arthurs  Consent: Verbal consent obtained. Risks and benefits: risks, benefits and alternatives were discussed Time out: Immediately prior to procedure a "time out" was called to verify the correct patient, procedure, equipment, support staff and site/side marked as required.  Preparation: Patient was prepped and draped in the usual sterile fashion.  Vein Location: L antecube  Ultrasound Guided  Gauge: 20 long   Normal blood return and flush without difficulty Patient tolerance: Patient tolerated the procedure well with no immediate complications.     Medications Ordered in ED Medications  gi cocktail (Maalox,Lidocaine,Donnatal) (has no administration in time range)  famotidine (PEPCID) IVPB 20 mg premix ( Intravenous Stopped 12/25/17 2115)  sodium chloride 0.9 % bolus 1,000 mL (0 mLs Intravenous Stopped 12/25/17 2221)  pantoprazole (PROTONIX) EC tablet 40 mg (40 mg Oral Given 12/25/17 2034)  iopamidol (ISOVUE-300) 61 % injection 100 mL (100 mLs Intravenous Contrast Given 12/25/17 2151)  fentaNYL (SUBLIMAZE) injection 50 mcg (50 mcg Intravenous Given 12/25/17 2312)     Initial  Impression / Assessment and Plan / ED Course  I have reviewed the triage vital signs and the nursing notes.  Pertinent labs & imaging results that were available during my care of the patient were reviewed by me and considered in my medical decision making (see chart for details).    [name] is a 67 y.o. female here with epigastric pain, burning chest pain. I think likely gastritis vs gastric ulcer vs reflux. Had previous myoview that was unremarkable and was admitted for chest pain a year ago and had seen cardiology and no further cardiac workup for recommended. Will get labs, lipase, UA, CT ab/pel. Will give pepcid, protonix, IVF and reassess.   11:13 PM CT showed sludge. LFTs nl, WBC nl. Repeat trop is 0.03. Plan to get another troponin and is stable, can follow up with cardiology and with surgery for biliary colic.   12:02 AM Signed out to Dr. Leonette Monarch to follow up RUQ [place] and repeat trop. If trop stable at 0.03 anticipate dc home with surgery and cardiology follow up.    Final Clinical Impressions(s) / ED Diagnoses   Final diagnoses:  Biliary colic    ED Discharge Orders         Ordered    HYDROcodone-acetaminophen (NORCO/VICODIN) 5-325  MG tablet  Every 6 hours PRN     12/25/17 2310    esomeprazole (NEXIUM) 40 MG capsule  Daily,   Status:  Discontinued     12/25/17 2310    esomeprazole (NEXIUM) 40 MG capsule  Daily     12/25/17 2313           Drenda Freeze, MD 12/26/17 0003

## 2017-12-26 DIAGNOSIS — K8020 Calculus of gallbladder without cholecystitis without obstruction: Secondary | ICD-10-CM | POA: Diagnosis not present

## 2017-12-26 LAB — TROPONIN I: Troponin I: 0.03 ng/mL (ref <0.03)

## 2017-12-26 MED ORDER — CALCIUM CARBONATE ANTACID 500 MG PO CHEW
1.0000 | CHEWABLE_TABLET | Freq: Once | ORAL | Status: AC
  Administered 2017-12-26: 200 mg via ORAL
  Filled 2017-12-26: qty 1

## 2017-12-26 MED ORDER — LIDOCAINE VISCOUS HCL 2 % MT SOLN: 15.0000 mL | Freq: Four times a day (QID) | OROMUCOSAL | 0 refills | Status: DC | PRN

## 2017-12-26 NOTE — ED Provider Notes (Signed)
I assumed care of this patient from Dr. Darl Householder at 0000.  Please see their note for further details of Hx, PE.  Briefly patient is a 67 y.o. female who presented with persistent chest, epigastric, and RUQ pain.  Work-up notable for stable EKG with mildly elevated troponin.  Plan for repeat troponin.  If stable, unlikely cardiac etiology.  Symptoms appear to be mostly GI related.  Thought to be possible biliary colic given CT scan.  Pending right upper quadrant ultrasound to assess for acute cholecystitis.  Labs are grossly reassuring without leukocytosis, biliary obstruction or evidence of pancreatitis.  Current plan is to follow-up ultrasound and repeat troponin.  Ultrasound positive for cholelithiasis without pericholecystic fluid or wall thickening.  It mention Percell Miller sign however on my repeat exam, patient did not have any Murphy sign suspicious for acute cholecystitis.  Upon further discussion with the patient, it appears that she has symptoms concerning for esophagitis with esophageal spasms and gastritis.  Patient endorses intake of acidic foods, hot temperature foods.  On review of records, patient is also been on doxycycline and NSAIDs in recent months which may be contributing to her symptomatology.  Discussed extensively change in diet.  Given additional resources.  Patient was prescribed Nexium by Dr. Darl Householder.  She also reports history of constipation, stating that she takes Tums frequently for her GI symptoms with minimal relief.  She also states that she takes Pepto-Bismol for stomach pains.  Labs today were notable for mild hypercalcemia at 10.8.  Instructed to stop taking any calcium-containing medications as well as Pepto-Bismol.  Also given instructions regarding dietary changes.  Patient troponin was stable at 0.03.  Upon review of records, patient was admitted November 2018 for mildly elevated troponins at this level and was evaluated by cardiology.  She had a Lexiscan in 2017 which was low  risk.  Again I have low suspicion for cardiac etiology of the patient's epigastric and chest pain.  Patient was able to tolerate oral intake in the emergency department.   Recommended GI follow-up regarding her likely gastritis/esophagitis.  Patient also given follow-up for surgery regarding cholelithiasis.  She was also recommended to follow-up with cardiology.  Also recommended close follow-up with her primary care provider for repeat can panel to assess elevated calcium level.  The patient is safe for discharge with strict return precautions.   Disposition: Discharge  Condition: Good  I have discussed the results, Dx and Tx plan with the patient and husband who expressed understanding and agree(s) with the plan. Discharge instructions discussed at great length. The patient and husband were given strict return precautions who verbalized understanding of the instructions. No further questions at time of discharge.    ED Discharge Orders         Ordered    lidocaine (XYLOCAINE) 2 % solution  Every 6 hours PRN     12/26/17 0338    HYDROcodone-acetaminophen (NORCO/VICODIN) 5-325 MG tablet  Every 6 hours PRN     12/25/17 2310    esomeprazole (NEXIUM) 40 MG capsule  Daily,   Status:  Discontinued     12/25/17 2310    esomeprazole (NEXIUM) 40 MG capsule  Daily     12/25/17 2313           Follow Up: Surgery, [facility] [address] [place] [address] [phone]   to follow up for gallstones  [place] [id number] [phone]  Gastroenterology, [facility] [place] [address] [phone]  Schedule an appointment as soon as possible for a visit  For close follow up to assess for likely esophagitis.gastritis  Susy Frizzle, MD [address] [place] [address] [phone]  Schedule an appointment as soon as  possible for a visit  in 1-2 weeks, For close follow up to assess for elevated calcium levels        Cardama, Grayce Sessions, MD 12/26/17 [phone]

## 2017-12-26 NOTE — ED Notes (Signed)
Pt discharged to home with family. NAD.  

## 2018-01-01 ENCOUNTER — Encounter: Payer: Self-pay | Admitting: Family Medicine

## 2018-01-01 ENCOUNTER — Ambulatory Visit (INDEPENDENT_AMBULATORY_CARE_PROVIDER_SITE_OTHER): Payer: Medicare HMO | Admitting: Family Medicine

## 2018-01-01 DIAGNOSIS — Z23 Encounter for immunization: Secondary | ICD-10-CM

## 2018-01-01 MED ORDER — AMLODIPINE BESYLATE 10 MG PO TABS: 10.0000 mg | ORAL_TABLET | Freq: Every day | ORAL | 3 refills | Status: DC

## 2018-01-01 MED ORDER — LOSARTAN POTASSIUM 50 MG PO TABS: 50.0000 mg | ORAL_TABLET | Freq: Every day | ORAL | 3 refills | Status: DC

## 2018-01-01 NOTE — Progress Notes (Signed)
 Subjective:    Patient ID: [name], female    DOB: 07/03/1950, 67 y.o.   MRN: [id number]  HPI  05/18/17 Patient called 1/29 and stated she could no longer afford toujeo 80 units a day and wanted to switch to generic 70/30.  I recommended 45 units in the am and 25 units in the pm.  Here today to titrate insulin dose.  After her last visit, I asked the patient to resume hydrochlorothiazide which she has stopped.  She did resume the hydrochlorothiazide but then discontinued losartan for reasons I am uncertain of.  As a result her blood pressure is elevated today.  She denies any chest pain shortness of breath or dyspnea on exertion.  She is taking her insulin as prescribed however she skips meals frequently.  She is only checked for sugar values.  Each of these readings were in the morning before she ate breakfast.  They are as follows: 260, 210, 170, 160.  At that time, my plan was:  Patient is instructed to continue hydrochlorothiazide 12.5 mg a day.  However she is instructed to resume losartan 50 mg a day.  I would like to recheck her blood pressure next week.  I have recommended that she take her insulin as prescribed 45 units in the morning and 25 unit.  I want her to check her blood sugar before every meal breakfast, lunch, dinner, and record the values that I can see the sugar readings in 1 week when.  For random sugars are not sufficient to make safe accurate adjustments in her insulin dose.  I also explained her the dangers and risk of hypoglycemia if she does not eat regularly.  I encouraged the patient to eat 3 regular meals per day to be compliant with her insulin.  Over the last few visits, the patient seems increasingly more confused.  I am concerned that she may be showing signs of possible dementia.  I will perform a Mini-Mental status exam at her next visit.  05/25/17 Patient presents today with sugar readings just as I have asked her to do.  Her blood sugars in the morning are  averaging between 140 and 170.  Her blood sugars at lunchtime are averaging between 170 and 200.  Her blood sugars in the evening are 200-250.  She is taking 45 units in the morning and 25 units in the evening after dinner.  Her blood pressure today is no better than it was last time.  Patient has still not started taking her blood pressure medication.  She states that she forgets to take the pill.  I performed a Mini-Mental status exam today which she scored 30 out of 30 on.  I was quite surprised by her performance on the Mini-Mental status exam as I was concerned that she may have some underlying dementia.  I do still feel that she has some underlying memory problems.  At the present time, she is administering her own medication.  Occasionally her husband will ask her if she is taken it but he is not directly supervising the medication.  At that time, my plan was: Spent 25 minutes today with the patient and her husband.  Despite her performance on the Mini-Mental status exam which she scored 30 out of 30 I feel that she has some mild short-term memory loss.  I suspect that given her medical history she is likely suffered some small strokes and may have some underlying vascular dementia.  Therefore I want to   work hard to reduce her risk factors.  Increase her insulin to 55 units in the morning.  Increase the evening dose to 30 units before supper.  Recheck 3 times daily sugars in 1 week.  Also gave strategies to help her remember to take her medication for her blood pressure and also recommended strongly that her husband administer the medicine to her or at least supervise her take her medication because of the memory problems    06/01/17 Blood pressure is much better now that she is taking both of her medications.  Blood pressure today is 126/78.  I continue to encourage compliance.  She is currently on 55 units of 70/30 in the morning and 30 units of 70/30 in the afternoon.  Fasting blood sugars are 130-100  and.  Lunchtime sugars are 200-250.  Dinner sugars are 200-250.  There is no episodes of hypoglycemia.  At that time, my plan was: Increase insulin 70/30-65 units in the morning to better manage her lunch and suppertime sugars.  Continue 30 units in the evening to avoid nocturnal hypoglycemia.  Recheck sugars in 1 week to make further titrations in insulin dosage.  Blood pressure is now well controlled.  No further change in medication.  Continue to encourage compliance  01/01/18 Patient was recently in the hospital with substernal chest pain/epigastric chest pain.  Cardiac enzymes were negative x2.  Patient had a CT scan of the abdomen and pelvis that showed small gallstones and layering sludge within the gallbladder.  Right upper quadrant ultrasound also confirmed gallstones but no evidence of cholecystitis.  She was discharged home on Nexium for acid reflux along with viscous lidocaine and was instructed to schedule a follow-up with general surgery.  It is important to note that the patient was seen by cardiology in November 2018 and had a Lexiscan Myoview performed which revealed no evidence of reversible ischemia and was similar to her previous stress test from 2017.  Cardiology at that time recommended no further cardiac work-up.  The patient's history today is very confusing.  She states that she is having substernal chest pain that is triggered by activity and it improves with rest.  Symptoms do not sound like gallstones.  She is already scheduled appointment to see the general surgeon.  She also reports nausea.  She denies any vomiting.  She denies any shortness of breath.  She denies any left arm pain.  She has no right upper quadrant tenderness.  She denies any association of the pain with food.  However viscous lidocaine did seem to help the pain when she was took it in the emergency room.  She was also found to have elevated calcium levels.  I reviewed the patient's medication list with her.  It  is very confusing.  Patient has not been taking her blood pressure medication at all.  She has not been taking hydrochlorothiazide.  She has not been taking losartan.  She is also usually not taking her insulin.  When she does take insulin she takes 65 units in the morning and 30 units in the evening.  She frequently forgets to take her medication.  I do not believe that this is noncompliance however I am concerned about dementia.  Patient had an MRI of the brain performed in May which showed chronic small vessel ischemic changes consistent with possible underlying vascular dementia.  Patient is administering her own medication and based on our conversation today, she cannot recall any of the names of the medicine she   is taking and is not certain of whether or not she is taking them although she is adamant that she has not taken her blood pressure medication in more than a week.  She is also not checking her sugars Current Outpatient Medications on File Prior to Visit  Medication Sig Dispense Refill  . acetaminophen (TYLENOL) 500 MG tablet Take 500 mg by mouth every 4 (four) hours as needed for mild pain or headache.    . Alcohol Swabs (B-D SINGLE USE SWABS REGULAR) PADS Use with insulin injections 300 each 3  . aspirin 81 MG tablet Take 81 mg by mouth daily.    . Blood Glucose Calibration (TRUE METRIX LEVEL 2) Normal SOLN Use as directed 3 each 3  . Blood Glucose Monitoring Suppl (TRUE METRIX AIR GLUCOSE METER) w/Device KIT 1 kit by Does not apply route 3 (three) times daily. 1 kit 0  . Calcium Carb-Cholecalciferol (CALTRATE 600+D3 SOFT) 600-800 MG-UNIT CHEW Chew 1 tablet by mouth 2 (two) times daily after a meal. 60 tablet 6  . diclofenac (VOLTAREN) 75 MG EC tablet Take 1 tablet (75 mg total) by mouth 2 (two) times daily. 60 tablet 3  . esomeprazole (NEXIUM) 40 MG capsule Take 1 capsule (40 mg total) by mouth daily. 30 capsule 0  . fluticasone (FLONASE) 50 MCG/ACT nasal spray Place 2 sprays into both  nostrils daily. 48 g 3  . gabapentin (NEURONTIN) 300 MG capsule Take 1 capsule (300 mg total) by mouth 3 (three) times daily. 90 capsule 3  . glucose blood (TRUE METRIX BLOOD GLUCOSE TEST) test strip Use as instructed 100 each 12  . hydrochlorothiazide (HYDRODIURIL) 12.5 MG tablet TAKE 1 TABLET BY MOUTH EVERY DAY 30 tablet 0  . hydrochlorothiazide (MICROZIDE) 12.5 MG capsule Take 1 capsule (12.5 mg total) by mouth daily. 90 capsule 3  . HYDROcodone-acetaminophen (NORCO/VICODIN) 5-325 MG tablet Take 1 tablet by mouth every 6 (six) hours as needed for moderate pain. 10 tablet 0  . insulin NPH-regular Human (NOVOLIN 70/30 RELION) (70-30) 100 UNIT/ML injection 45 u qam & 25 u qpm (Patient taking differently: 65 u qam & 30 u qpm) 10 mL 11  . levothyroxine (SYNTHROID, LEVOTHROID) 100 MCG tablet Take 1 tablet (100 mcg total) by mouth daily. 90 tablet 3  . lidocaine (XYLOCAINE) 2 % solution Use as directed 15 mLs in the mouth or throat every 6 (six) hours as needed for mouth pain. 100 mL 0  . meclizine (ANTIVERT) 12.5 MG tablet 1-2 tab po tid prn dizziness 60 tablet 1  . metFORMIN (GLUCOPHAGE) 850 MG tablet Take 1 tablet (850 mg total) by mouth 2 (two) times daily with a meal. 180 tablet 3  . Misc Natural Products (LAXATIVE FORMULA) TABS Take 1 tablet by mouth as needed (for BM).    . TOUJEO SOLOSTAR 300 UNIT/ML SOPN INJECT 160 UNITS INTO THE SKIN EVERY MORNING. 18 pen 1  . TRUEPLUS LANCETS 33G MISC Use as directed 300 each 3   No current facility-administered medications on file prior to visit.     Allergies  Allergen Reactions  . Dilaudid [Hydromorphone Hcl] Nausea And Vomiting  . Lipitor [Atorvastatin] Other (See Comments)    Body & Muscle Aches  . Adhesive [Tape] Rash  . Ampicillin Rash    REACTION: Rash and welts on her hands when she took it in the hospital  . Latex Itching, Dermatitis and Rash    REACTION: Patient had a reaction to tape after surgery and they told her she  was allergic to  Latex 03/20/2012 "if it's on too long it will break me out"  . Penicillins Rash   Social History   Socioeconomic History  . Marital status: Married    Spouse name: Not on file  . Number of children: 1  . Years of education: Not on file  . Highest education level: Not on file  Occupational History  . Occupation: Retired since 2003  Social Needs  . Financial resource strain: Not on file  . Food insecurity:    Worry: Not on file    Inability: Not on file  . Transportation needs:    Medical: Not on file    Non-medical: Not on file  Tobacco Use  . Smoking status: Never Smoker  . Smokeless tobacco: Never Used  Substance and Sexual Activity  . Alcohol use: No  . Drug use: No  . Sexual activity: Not on file  Lifestyle  . Physical activity:    Days per week: Not on file    Minutes per session: Not on file  . Stress: Not on file  Relationships  . Social connections:    Talks on phone: Not on file    Gets together: Not on file    Attends religious service: Not on file    Active member of club or organization: Not on file    Attends meetings of clubs or organizations: Not on file    Relationship status: Not on file  . Intimate partner violence:    Fear of current or ex partner: Not on file    Emotionally abused: Not on file    Physically abused: Not on file    Forced sexual activity: Not on file  Other Topics Concern  . Not on file  Social History Narrative  . Not on file   Review of Systems  All other systems reviewed and are negative.    Review of Systems  All other systems reviewed and are negative.      Objective:   Physical Exam  Constitutional: She is oriented to person, place, and time. She appears well-developed and well-nourished. No distress.  Neck: Neck supple. No JVD present. No thyromegaly present.  Cardiovascular: Normal rate, regular rhythm and normal heart sounds. Exam reveals no gallop and no friction rub.  No murmur heard. Pulmonary/Chest:  Effort normal and breath sounds normal. No respiratory distress. She has no wheezes. She has no rales. She exhibits no tenderness.  Abdominal: Soft. Bowel sounds are normal. She exhibits no distension and no mass. There is no tenderness. There is no rebound and no guarding.  Lymphadenopathy:    She has no cervical adenopathy.  Neurological: She is alert and oriented to person, place, and time.  Skin: She is not diaphoretic.  Vitals reviewed.         Assessment & Plan:  Hospital discharge follow-up Medication noncompliance secondary to probable vascular dementia Hypertensive urgency Uncontrolled diabetes mellitus type 2 Cholelithiasis Chest pain   Based on the history I am obtaining from the patient today, her symptoms do not sound convincing for cholelithiasis.  Symptoms are more consistent with angina given chest pain triggered by activity that improves with rest however the patient had a negative stress test in November 2018 for similar symptoms.  Therefore I am going to focus on blood pressure control.  I was very clear with the husband, he must administer her medicines.  I do not believe that the patient can safely administer her own medications secondary to probable  underlying vascular dementia.  I want him to discontinue hydrochlorothiazide due to hypercalcemia.  I want him to start losartan 50 mg a day and amlodipine 10 mg a day immediately and recheck blood pressure on Friday here.  She is to return to the hospital immediately if chest pain worsens.  I want her husband also to administer her insulin 70/30, 65 units in the morning and 30 units in the evening.  I personally went through her medication list with the husband.  I removed diclofenac.  I removed calcium.  I remove hydrochlorothiazide.  I removed meclizine.  At the present time, the patient is to be taking losartan, amlodipine, levothyroxine, metformin, aspirin, NPH/7030 65 units in the morning and 30 units in the evening, and  Nexium.  She has an appointment to see general surgery Friday however I am not certain that her chest pain is due to gallstones.  Therefore I will wait to see how she responds to bring her blood pressure down.  Continue Nexium for possible GERD.  If chest pain returns or worsens, she is to go to the hospital.  I am concerned that her hypercalcemia could be due to hyperparathyroidism.  Discontinue calcium.  Discontinue hydrochlorothiazide even though the patient does not sound like she is been taking it.  Check PTH.  If PTH is elevated, we may need to consult general surgery for possible hyperparathyroidism.  Recheck blood pressure on Friday.

## 2018-01-03 LAB — CBC WITH DIFFERENTIAL/PLATELET
Basophils Absolute: 83 cells/uL (ref 0–200)
Basophils Relative: 1.1 %
Eosinophils Absolute: 255 cells/uL (ref 15–500)
Eosinophils Relative: 3.4 %
HCT: 42.5 % (ref 35.0–45.0)
Hemoglobin: 14.1 g/dL (ref 11.7–15.5)
Lymphs Abs: 2850 cells/uL (ref 850–3900)
MCH: 29.2 pg (ref 27.0–33.0)
MCHC: 33.2 g/dL (ref 32.0–36.0)
MCV: 88 fL (ref 80.0–100.0)
MPV: 10.1 fL (ref 7.5–12.5)
Monocytes Relative: 7 %
Neutro Abs: 3788 cells/uL (ref 1500–7800)
Neutrophils Relative %: 50.5 %
Platelets: 311 10*3/uL (ref 140–400)
RBC: 4.83 10*6/uL (ref 3.80–5.10)
RDW: 12.8 % (ref 11.0–15.0)
Total Lymphocyte: 38 %
WBC mixed population: 525 cells/uL (ref 200–950)
WBC: 7.5 10*3/uL (ref 3.8–10.8)

## 2018-01-03 LAB — COMPLETE METABOLIC PANEL WITH GFR
AG Ratio: 1.2 (calc) (ref 1.0–2.5)
ALKALINE PHOSPHATASE (APISO): 57 U/L (ref 33–130)
ALT: 16 U/L (ref 6–29)
AST: 23 U/L (ref 10–35)
Albumin: 4 g/dL (ref 3.6–5.1)
BUN / CREAT RATIO: 16 (calc) (ref 6–22)
BUN: 17 mg/dL (ref 7–25)
CO2: 24 mmol/L (ref 20–32)
Calcium: 10.9 mg/dL — ABNORMAL HIGH (ref 8.6–10.4)
Chloride: 106 mmol/L (ref 98–110)
Creat: 1.07 mg/dL — ABNORMAL HIGH (ref 0.50–0.99)
GFR, Est African American: 61 mL/min/{1.73_m2} (ref >=60)
GFR, Est Non African American: 53 mL/min/{1.73_m2} — ABNORMAL LOW (ref >=60)
GLUCOSE: 167 mg/dL — AB (ref 65–139)
Globulin: 3.4 g/dL (calc) (ref 1.9–3.7)
Potassium: 3.8 mmol/L (ref 3.5–5.3)
SODIUM: 140 mmol/L (ref 135–146)
Total Bilirubin: 0.3 mg/dL (ref 0.2–1.2)
Total Protein: 7.4 g/dL (ref 6.1–8.1)

## 2018-01-03 LAB — PARATHYROID HORMONE, INTACT (NO CA): PTH: 79 pg/mL — ABNORMAL HIGH (ref 14–64)

## 2018-01-04 ENCOUNTER — Other Ambulatory Visit: Payer: Self-pay | Admitting: Family Medicine

## 2018-01-04 DIAGNOSIS — IMO0002 Reserved for concepts with insufficient information to code with codable children: Secondary | ICD-10-CM

## 2018-01-04 DIAGNOSIS — E213 Hyperparathyroidism, unspecified: Secondary | ICD-10-CM

## 2018-01-04 DIAGNOSIS — E118 Type 2 diabetes mellitus with unspecified complications: Principal | ICD-10-CM

## 2018-01-04 DIAGNOSIS — E1165 Type 2 diabetes mellitus with hyperglycemia: Secondary | ICD-10-CM

## 2018-01-05 ENCOUNTER — Ambulatory Visit: Payer: Medicare HMO | Admitting: Family Medicine

## 2018-01-05 ENCOUNTER — Ambulatory Visit (INDEPENDENT_AMBULATORY_CARE_PROVIDER_SITE_OTHER): Payer: Medicare HMO | Admitting: Family Medicine

## 2018-01-05 ENCOUNTER — Encounter: Payer: Self-pay | Admitting: Family Medicine

## 2018-01-05 VITALS — BP 142/70 | HR 68 | Temp 98.4°F | Resp 16 | Ht 65.0 in | Wt 206.0 lb

## 2018-01-05 DIAGNOSIS — I10 Essential (primary) hypertension: Secondary | ICD-10-CM | POA: Diagnosis not present

## 2018-01-05 DIAGNOSIS — E213 Hyperparathyroidism, unspecified: Secondary | ICD-10-CM

## 2018-01-05 NOTE — Progress Notes (Signed)
 Subjective:    Patient ID: [name], female    DOB: 10/12/1950, 67 y.o.   MRN: [id number]  HPI  05/18/17 Patient called 1/29 and stated she could no longer afford toujeo 80 units a day and wanted to switch to generic 70/30.  I recommended 45 units in the am and 25 units in the pm.  Here today to titrate insulin dose.  After her last visit, I asked the patient to resume hydrochlorothiazide which she has stopped.  She did resume the hydrochlorothiazide but then discontinued losartan for reasons I am uncertain of.  As a result her blood pressure is elevated today.  She denies any chest pain shortness of breath or dyspnea on exertion.  She is taking her insulin as prescribed however she skips meals frequently.  She is only checked for sugar values.  Each of these readings were in the morning before she ate breakfast.  They are as follows: 260, 210, 170, 160.  At that time, my plan was:  Patient is instructed to continue hydrochlorothiazide 12.5 mg a day.  However she is instructed to resume losartan 50 mg a day.  I would like to recheck her blood pressure next week.  I have recommended that she take her insulin as prescribed 45 units in the morning and 25 unit.  I want her to check her blood sugar before every meal breakfast, lunch, dinner, and record the values that I can see the sugar readings in 1 week when.  For random sugars are not sufficient to make safe accurate adjustments in her insulin dose.  I also explained her the dangers and risk of hypoglycemia if she does not eat regularly.  I encouraged the patient to eat 3 regular meals per day to be compliant with her insulin.  Over the last few visits, the patient seems increasingly more confused.  I am concerned that she may be showing signs of possible dementia.  I will perform a Mini-Mental status exam at her next visit.  05/25/17 Patient presents today with sugar readings just as I have asked her to do.  Her blood sugars in the morning are  averaging between 140 and 170.  Her blood sugars at lunchtime are averaging between 170 and 200.  Her blood sugars in the evening are 200-250.  She is taking 45 units in the morning and 25 units in the evening after dinner.  Her blood pressure today is no better than it was last time.  Patient has still not started taking her blood pressure medication.  She states that she forgets to take the pill.  I performed a Mini-Mental status exam today which she scored 30 out of 30 on.  I was quite surprised by her performance on the Mini-Mental status exam as I was concerned that she may have some underlying dementia.  I do still feel that she has some underlying memory problems.  At the present time, she is administering her own medication.  Occasionally her husband will ask her if she is taken it but he is not directly supervising the medication.  At that time, my plan was: Spent 25 minutes today with the patient and her husband.  Despite her performance on the Mini-Mental status exam which she scored 30 out of 30 I feel that she has some mild short-term memory loss.  I suspect that given her medical history she is likely suffered some small strokes and may have some underlying vascular dementia.  Therefore I want to   work hard to reduce her risk factors.  Increase her insulin to 55 units in the morning.  Increase the evening dose to 30 units before supper.  Recheck 3 times daily sugars in 1 week.  Also gave strategies to help her remember to take her medication for her blood pressure and also recommended strongly that her husband administer the medicine to her or at least supervise her take her medication because of the memory problems    06/01/17 Blood pressure is much better now that she is taking both of her medications.  Blood pressure today is 126/78.  I continue to encourage compliance.  She is currently on 55 units of 70/30 in the morning and 30 units of 70/30 in the afternoon.  Fasting blood sugars are 130-100  and.  Lunchtime sugars are 200-250.  Dinner sugars are 200-250.  There is no episodes of hypoglycemia.  At that time, my plan was: Increase insulin 70/30-65 units in the morning to better manage her lunch and suppertime sugars.  Continue 30 units in the evening to avoid nocturnal hypoglycemia.  Recheck sugars in 1 week to make further titrations in insulin dosage.  Blood pressure is now well controlled.  No further change in medication.  Continue to encourage compliance  01/01/18 Patient was recently in the hospital with substernal chest pain/epigastric chest pain.  Cardiac enzymes were negative x2.  Patient had a CT scan of the abdomen and pelvis that showed small gallstones and layering sludge within the gallbladder.  Right upper quadrant ultrasound also confirmed gallstones but no evidence of cholecystitis.  She was discharged home on Nexium for acid reflux along with viscous lidocaine and was instructed to schedule a follow-up with general surgery.  It is important to note that the patient was seen by cardiology in November 2018 and had a Lexiscan Myoview performed which revealed no evidence of reversible ischemia and was similar to her previous stress test from 2017.  Cardiology at that time recommended no further cardiac work-up.  The patient's history today is very confusing.  She states that she is having substernal chest pain that is triggered by activity and it improves with rest.  Symptoms do not sound like gallstones.  She is already scheduled appointment to see the general surgeon.  She also reports nausea.  She denies any vomiting.  She denies any shortness of breath.  She denies any left arm pain.  She has no right upper quadrant tenderness.  She denies any association of the pain with food.  However viscous lidocaine did seem to help the pain when she was took it in the emergency room.  She was also found to have elevated calcium levels.  I reviewed the patient's medication list with her.  It  is very confusing.  Patient has not been taking her blood pressure medication at all.  She has not been taking hydrochlorothiazide.  She has not been taking losartan.  She is also usually not taking her insulin.  When she does take insulin she takes 65 units in the morning and 30 units in the evening.  She frequently forgets to take her medication.  I do not believe that this is noncompliance however I am concerned about dementia.  Patient had an MRI of the brain performed in May which showed chronic small vessel ischemic changes consistent with possible underlying vascular dementia.  Patient is administering her own medication and based on our conversation today, she cannot recall any of the names of the medicine she   is taking and is not certain of whether or not she is taking them although she is adamant that she has not taken her blood pressure medication in more than a week.  She is also not checking her sugars Current Outpatient Medications on File Prior to Visit  Medication Sig Dispense Refill  . amLODipine (NORVASC) 10 MG tablet Take 1 tablet (10 mg total) by mouth daily. 90 tablet 3  . aspirin 81 MG tablet Take 81 mg by mouth daily.    . Blood Glucose Calibration (TRUE METRIX LEVEL 2) Normal SOLN Use as directed 3 each 3  . Blood Glucose Monitoring Suppl (TRUE METRIX AIR GLUCOSE METER) w/Device KIT 1 kit by Does not apply route 3 (three) times daily. 1 kit 0  . esomeprazole (NEXIUM) 40 MG capsule Take 1 capsule (40 mg total) by mouth daily. 30 capsule 0  . fluticasone (FLONASE) 50 MCG/ACT nasal spray Place 2 sprays into both nostrils daily. 48 g 3  . gabapentin (NEURONTIN) 300 MG capsule Take 1 capsule (300 mg total) by mouth 3 (three) times daily. 90 capsule 3  . glucose blood (TRUE METRIX BLOOD GLUCOSE TEST) test strip Use as instructed 100 each 12  . HYDROcodone-acetaminophen (NORCO/VICODIN) 5-325 MG tablet Take 1 tablet by mouth every 6 (six) hours as needed for moderate pain. 10 tablet 0  .  insulin NPH-regular Human (NOVOLIN 70/30 RELION) (70-30) 100 UNIT/ML injection 45 u qam & 25 u qpm (Patient taking differently: 65 u qam & 30 u qpm) 10 mL 11  . levothyroxine (SYNTHROID, LEVOTHROID) 100 MCG tablet Take 1 tablet (100 mcg total) by mouth daily. 90 tablet 3  . lidocaine (XYLOCAINE) 2 % solution Use as directed 15 mLs in the mouth or throat every 6 (six) hours as needed for mouth pain. 100 mL 0  . losartan (COZAAR) 50 MG tablet Take 1 tablet (50 mg total) by mouth daily. 90 tablet 3  . metFORMIN (GLUCOPHAGE) 850 MG tablet Take 1 tablet (850 mg total) by mouth 2 (two) times daily with a meal. 180 tablet 3  . Misc Natural Products (LAXATIVE FORMULA) TABS Take 1 tablet by mouth as needed (for BM).    . TRUEPLUS LANCETS 33G MISC Use as directed 300 each 3   No current facility-administered medications on file prior to visit.     Allergies  Allergen Reactions  . Dilaudid [Hydromorphone Hcl] Nausea And Vomiting  . Lipitor [Atorvastatin] Other (See Comments)    Body & Muscle Aches  . Adhesive [Tape] Rash  . Ampicillin Rash    REACTION: Rash and welts on her hands when she took it in the hospital  . Latex Itching, Dermatitis and Rash    REACTION: Patient had a reaction to tape after surgery and they told her she was allergic to Latex 03/20/2012 "if it's on too long it will break me out"  . Penicillins Rash   Social History   Socioeconomic History  . Marital status: Married    Spouse name: Not on file  . Number of children: 1  . Years of education: Not on file  . Highest education level: Not on file  Occupational History  . Occupation: Retired since 2003  Social Needs  . Financial resource strain: Not on file  . Food insecurity:    Worry: Not on file    Inability: Not on file  . Transportation needs:    Medical: Not on file    Non-medical: Not on file  Tobacco Use  .  Smoking status: Never Smoker  . Smokeless tobacco: Never Used  Substance and Sexual Activity  .  Alcohol use: No  . Drug use: No  . Sexual activity: Not on file  Lifestyle  . Physical activity:    Days per week: Not on file    Minutes per session: Not on file  . Stress: Not on file  Relationships  . Social connections:    Talks on phone: Not on file    Gets together: Not on file    Attends religious service: Not on file    Active member of club or organization: Not on file    Attends meetings of clubs or organizations: Not on file    Relationship status: Not on file  . Intimate partner violence:    Fear of current or ex partner: Not on file    Emotionally abused: Not on file    Physically abused: Not on file    Forced sexual activity: Not on file  Other Topics Concern  . Not on file  Social History Narrative  . Not on file   Review of Systems  All other systems reviewed and are negative.    Review of Systems  All other systems reviewed and are negative.      Objective:   Physical Exam  Constitutional: She is oriented to person, place, and time. She appears well-developed and well-nourished. No distress.  Neck: Neck supple. No JVD present. No thyromegaly present.  Cardiovascular: Normal rate, regular rhythm and normal heart sounds. Exam reveals no gallop and no friction rub.  No murmur heard. Pulmonary/Chest: Effort normal and breath sounds normal. No respiratory distress. She has no wheezes. She has no rales. She exhibits no tenderness.  Abdominal: Soft. Bowel sounds are normal. She exhibits no distension and no mass. There is no tenderness. There is no rebound and no guarding.  Lymphadenopathy:    She has no cervical adenopathy.  Neurological: She is alert and oriented to person, place, and time.  Skin: She is not diaphoretic.  Vitals reviewed.         Assessment & Plan:  Hyperparathyroidism ([place])  Hypercalcemia  Essential hypertension  Pressure is now much better at 142/70.  The patient has an appointment today to see the surgeon to discuss her  gallstones.  She continues to have atypical substernal chest pain now unrelated to activity with no shortness of breath.  I am not convinced that this is her gallbladder.  If the surgeon agrees, I would recommend with a GI consultation for possible EGD.  However I would appreciate the patient seeing the surgeon today to discuss her hyperparathyroidism.  PTH was greater than 70 despite her elevated calcium level.  I have recommended discontinuation of calcium and hydrochlorothiazide and then repeat a BMP in 1 month.  If calcium levels remain elevated, she may require surgical excision of her parathyroid glands.  Recheck the patient in 1 month.  Thankfully blood pressure is much better today

## 2018-01-08 ENCOUNTER — Encounter: Payer: Self-pay | Admitting: Podiatry

## 2018-01-08 ENCOUNTER — Ambulatory Visit: Payer: Medicare HMO | Admitting: Podiatry

## 2018-01-08 DIAGNOSIS — M79676 Pain in unspecified toe(s): Secondary | ICD-10-CM | POA: Diagnosis not present

## 2018-01-08 DIAGNOSIS — B351 Tinea unguium: Secondary | ICD-10-CM | POA: Diagnosis not present

## 2018-01-08 DIAGNOSIS — E0843 Diabetes mellitus due to underlying condition with diabetic autonomic (poly)neuropathy: Secondary | ICD-10-CM | POA: Diagnosis not present

## 2018-01-08 NOTE — Progress Notes (Signed)
Subjective: 67 y.o. returns the office today for painful, elongated, thickened toenails which she cannot trim herself.  She states that she gets some pain to the ball of her feet. She cannot wear her diabetic shoes due to them being "heavy".  Denies any redness or drainage around the nails. Denies any acute changes since last appointment and no new complaints today. Denies any systemic complaints such as fevers, chills, nausea, vomiting.   PCP: Susy Frizzle, MD  Objective: AAO 3, NAD DP/PT pulses palpable, CRT less than 3 seconds Nails hypertrophic, dystrophic, elongated, brittle, discolored 10. There is tenderness overlying the nails 1-5 bilaterally. There is no surrounding erythema or drainage along the nail sites.  There was some dried blood in the left hallux toenail removal procedure but there is no bleeding identified today no open sores or signs of infection. Prominent metatarsal heads b/l. Subjectively there is tenderness sub metatarsal 1-5 bilaterally.  No open lesions or pre-ulcerative lesions are identified. No other areas of tenderness bilateral lower extremities. No overlying edema, erythema, increased warmth. No pain with calf compression, swelling, warmth, erythema.  Assessment: Patient presents with symptomatic onychomycosis; metatarsalgia  Plan: -Treatment options including alternatives, risks, complications were discussed -Nails sharply debrided 10 without complication/bleeding. -Discussed with her to put the diabetic inserts or bring him into the office and I can evaluate him and may be offload. -Discussed daily foot inspection. If there are any changes, to call the office immediately.  -Follow-up in 3 months or sooner if any problems are to arise. In the meantime, encouraged to call the office with any questions, concerns, changes symptoms.  Celesta Gentile, DPM

## 2018-01-09 ENCOUNTER — Ambulatory Visit: Payer: Self-pay | Admitting: Surgery

## 2018-01-09 DIAGNOSIS — K8020 Calculus of gallbladder without cholecystitis without obstruction: Secondary | ICD-10-CM | POA: Diagnosis not present

## 2018-01-09 NOTE — H&P (Signed)
[name] Documented: 01/09/2018 10:56 AM Location: [facility] Patient #: [id number] DOB: 04/05/1951 Married / Language: English / Race: Black or African American Female  History of Present Illness Desiree Moores [name] MD; 01/09/2018 12:16 PM) Patient words: CLINICAL DATA: Chest pain for 1 week. Kept her up last night. Right upper quadrant pain. CT demonstrated gallstones.  EXAM: ULTRASOUND ABDOMEN LIMITED RIGHT UPPER QUADRANT  COMPARISON: CT abdomen and pelvis 12/25/2017  FINDINGS: Gallbladder:  Multiple small stones layering in the dependent gallbladder. No gallbladder wall thickening or edema. Murphy's sign is positive.  Common bile duct:  Diameter: 4 mm, normal  Liver:  Diffusely increased parenchymal echotexture consistent with diffuse fatty infiltration. Some focal sparing adjacent to the gallbladder fossa.  IMPRESSION: Cholelithiasis with positive Murphy's sign. These findings are consistent with acute cholecystitis in the appropriate clinical setting. Diffuse fatty infiltration of the liver.   Electronically Signed By: [name] M.D. On: 12/26/2017 00:17             CLINICAL DATA: Chest pain for 1 week. Kept her up last night. Right upper quadrant pain. CT demonstrated gallstones.  EXAM: ULTRASOUND ABDOMEN LIMITED RIGHT UPPER QUADRANT  COMPARISON: CT abdomen and pelvis 12/25/2017  FINDINGS: Gallbladder:  Multiple small stones layering in the dependent gallbladder. No gallbladder wall thickening or edema. Murphy's sign is positive.  Common bile duct:  Diameter: 4 mm, normal  Liver:  Diffusely increased parenchymal echotexture consistent with diffuse fatty infiltration. Some focal sparing adjacent to the gallbladder fossa.  IMPRESSION: Cholelithiasis with positive Murphy's sign. These findings are consistent with acute cholecystitis in the appropriate clinical setting. Diffuse fatty infiltration of the  liver.   Electronically Signed By: [name] M.D. On: 12/26/2017 00:17   Results for [name], [name] (MRN [id number]) as of 01/09/2018 12:14 Ref. Range 01/02/2018 16:06 Sodium Latest Ref Range: 135 - 146 mmol/L 140 Potassium Latest Ref Range: 3.5 - 5.3 mmol/L 3.8 Chloride Latest Ref Range: 98 - 110 mmol/L 106 CO2 Latest Ref Range: 20 - 32 mmol/L 24 Glucose Latest Ref Range: 65 - 139 mg/dL 167 (H) BUN Latest Ref Range: 7 - 25 mg/dL 17 Creatinine Latest Ref Range: 0.50 - 0.99 mg/dL 1.07 (H) Calcium Latest Ref Range: 8.6 - 10.4 mg/dL 10.9 (H) BUN/Creatinine Ratio Latest Ref Range: 6 - 22 (calc) 16 AG Ratio Latest Ref Range: 1.0 - 2.5 (calc) 1.2 AST Latest Ref Range: 10 - 35 U/L 23 ALT Latest Ref Range: 6 - 29 U/L 16 Total Protein Latest Ref Range: 6.1 - 8.1 g/dL 7.4 Total Bilirubin Latest Ref Range: 0.2 - 1.2 mg/dL 0.3 GFR, Est Non African American Latest Ref Range: > OR = 60 mL/min/1.76m2 53 (L) GFR, Est African American Latest Ref Range: > OR = 60 mL/min/1.8m2 61 Alkaline phosphatase (APISO) Latest Ref Range: 33 - 130 U/L 57 Globulin Latest Ref Range: 1.9 - 3.7 g/dL (calc) 3.4 WBC Latest Ref Range: 3.8 - 10.8 Thousand/uL 7.5 WBC mixed population Latest Ref Range: 200 - 950 cells/uL 525 RBC Latest Ref Range: 3.80 - 5.10 Million/uL 4.83 Hemoglobin Latest Ref Range: 11.7 - 15.5 g/dL 14.1 HCT Latest Ref Range: 35.0 - 45.0 % 42.5 MCV Latest Ref Range: 80.0 - 100.0 fL 88.0 MCH Latest Ref Range: 27.0 - 33.0 pg 29.2 MCHC Latest Ref Range: 32.0 - 36.0 g/dL 33.2 RDW Latest Ref Range: 11.0 - 15.0 % 12.8 Platelets Latest Ref Range: 140 - 400 Thousand/uL 311 MPV Latest Ref Range: 7.5 - 12.5 fL 10.1 Neutrophils Latest Units: %  50.5 Monocytes Relative Latest Units: % 7.0 Eosinophil Latest Units: % 3.4 Basophil Latest Units: % 1.1 NEUT# Latest Ref Range: 1,500 - 7,800 cells/uL 3,788 Lymphocyte # Latest Ref Range: 850 - 3,900 cells/uL 2,850 Total Lymphocyte Latest Units: %  38.0 Eosinophils Absolute Latest Ref Range: 15 - 500 cells/uL 255 Basophils Absolute Latest Ref Range: 0 - 200 cells/uL 83 PTH, Intact Latest Ref Range: 14 - 64 pg/mL 79 (H) Albumin MSPROF Latest Ref Range: 3.6 - 5.1 g/dL 4.0.  The patient is a 68 year old female.   Allergies [name]; 01/09/2018 10:56 AM) Dilaudid *ANALGESICS - OPIOID* Lipitor *ANTIHYPERLIPIDEMICS* Adhesive Tape Ampicillin *PENICILLINS* Penicillins Allergies Reconciled  Medication History [name]; 01/09/2018 10:56 AM) MetFORMIN HCl (850MG  Tablet, Oral) Active. Tylenol (325MG  Tablet, Oral) Active. Aspirin (81MG  Tablet, Oral) Active. Calcium (Oral) Specific strength unknown - Active. Flonase (50MCG/ACT Suspension, Nasal) Active. Neurontin (300MG  Capsule, Oral) Active. HydroCHLOROthiazide (12.5MG  Tablet, Oral) Active. Norco (5-325MG  Tablet, Oral) Active. NovoLOG (100UNIT/ML Solution, Subcutaneous) Active. Levothyroxine Sodium (100MCG Tablet, Oral) Active. Losartan Potassium-HCTZ (50-12.5MG  Tablet, Oral) Active. Medications Reconciled    Vitals [name]; 01/09/2018 10:57 AM) 01/09/2018 10:56 AM Weight: 206.5 lb Height: 65in Body Surface Area: 2.01 m Body Mass Index: 34.36 kg/m  Temp.: 98.40F(Oral)  Pulse: 75 (Regular)  BP: 130/82 (Sitting, Left Arm, Standard)      Physical Exam (Desiree Perdomo A. [name] MD; 01/09/2018 12:16 PM)  General Mental Status-Alert. General Appearance-Consistent with stated age. Hydration-Well hydrated. Voice-Normal.  Head and Neck Head-normocephalic, atraumatic with no lesions or palpable masses.  Chest and Lung Exam Chest and lung exam reveals -quiet, even and easy respiratory effort with no use of accessory muscles and on auscultation, normal breath sounds, no adventitious sounds and normal vocal resonance. Inspection Chest Wall - Normal. Back - normal.  Cardiovascular Cardiovascular examination reveals -on  palpation PMI is normal in location and amplitude, no palpable S3 or S4. Normal cardiac borders., normal heart sounds, regular rate and rhythm with no murmurs, carotid auscultation reveals no bruits and normal pedal pulses bilaterally.  Abdomen Inspection Inspection of the abdomen reveals - No Hernias. Skin - Scar - no surgical scars. Palpation/Percussion Palpation and Percussion of the abdomen reveal - Soft, Non Tender, No Rebound tenderness, No Rigidity (guarding) and No hepatosplenomegaly. Auscultation Auscultation of the abdomen reveals - Bowel sounds normal.  Neurologic Neurologic evaluation reveals -alert and oriented x 3 with no impairment of recent or remote memory. Mental Status-Normal.  Musculoskeletal Normal Exam - Left-Upper Extremity Strength Normal and Lower Extremity Strength Normal. Normal Exam - Right-Upper Extremity Strength Normal, Lower Extremity Weakness.    Assessment & Plan (Rashaunda Rahl A. Torey Reinard MD; 01/09/2018 12:17 PM)  SYMPTOMATIC CHOLELITHIASIS (K80.20) Impression: Patient with pain and a large gallstone in the gallbladder. She had a history of multiple attacks and he seemed to be worsening. Recommend laparoscopic cholecystectomy with possible cholangiogram. The procedure has been discussed with the patient. Risks of laparoscopic cholecystectomy include bleeding, infection, bile duct injury, leak, death, open surgery, diarrhea, other surgery, organ injury, blood vessel injury, DVT, and additional care.  Current Plans You are being scheduled for surgery- Our schedulers will call you.  You should hear from our office's scheduling department within 5 working days about the location, date, and time of surgery. We try to make accommodations for patient's preferences in scheduling surgery, but sometimes the OR schedule or the surgeon's schedule prevents [place] from making those accommodations.  If you have not heard from our office [phone]) in 5 working  days, call  the office and ask for your surgeon's nurse.  If you have other questions about your diagnosis, plan, or surgery, call the office and ask for your surgeon's nurse.  Pt Education - Pamphlet Given - Laparoscopic Gallbladder Surgery: discussed with patient and provided information. Written instructions provided The anatomy & physiology of hepatobiliary & pancreatic function was discussed. The pathophysiology of gallbladder dysfunction was discussed. Natural history risks without surgery was discussed. I feel the risks of no intervention will lead to serious problems that outweigh the operative risks; therefore, I recommended cholecystectomy to remove the pathology. I explained laparoscopic techniques with possible need for an open approach. Probable cholangiogram to evaluate the bilary tract was explained as well.  Risks such as bleeding, infection, abscess, leak, injury to other organs, need for further treatment, heart attack, death, and other risks were discussed. I noted a good likelihood this will help address the problem. Possibility that this will not correct all abdominal symptoms was explained. Goals of post-operative recovery were discussed as well. We will work to minimize complications. An educational handout further explaining the pathology and treatment options was given as well. Questions were answered. The patient expresses understanding & wishes to proceed with surgery.  Pt Education - [facility] Laparosopic Post Op HCI (Gross) Pt Education - [facility] Good Bowel Health (Gross) Pt Education - Laparoscopic Cholecystectomy: gallbladder  HYPERCALCEMIA (E83.52) Impression: Mildly elevated PTH  Calcium levels are in the 10 range  She is being referred to endocrinology. She could have primary hyperparathyroidism and may benefit from sestamibi study and/or CT angiogram of the neck for further evaluation. This can be dealt with later. She is asymptomatic otherwise.

## 2018-01-09 NOTE — H&P (View-Only) (Signed)
[name] Documented: 01/09/2018 10:56 AM Location: [facility] Patient #: [id number] DOB: Feb 23, 1951 Married / Language: English / Race: Black or African American Female  History of Present Illness [name] A. Desiree Sandler MD; 01/09/2018 12:16 PM) Patient words: CLINICAL DATA: Chest pain for 1 week. Kept her up last night. Right upper quadrant pain. CT demonstrated gallstones.  EXAM: ULTRASOUND ABDOMEN LIMITED RIGHT UPPER QUADRANT  COMPARISON: CT abdomen and pelvis 12/25/2017  FINDINGS: Gallbladder:  Multiple small stones layering in the dependent gallbladder. No gallbladder wall thickening or edema. Murphy's sign is positive.  Common bile duct:  Diameter: 4 mm, normal  Liver:  Diffusely increased parenchymal echotexture consistent with diffuse fatty infiltration. Some focal sparing adjacent to the gallbladder fossa.  IMPRESSION: Cholelithiasis with positive Murphy's sign. These findings are consistent with acute cholecystitis in the appropriate clinical setting. Diffuse fatty infiltration of the liver.   Electronically Signed By: [name] M.D. On: 12/26/2017 00:17             CLINICAL DATA: Chest pain for 1 week. Kept her up last night. Right upper quadrant pain. CT demonstrated gallstones.  EXAM: ULTRASOUND ABDOMEN LIMITED RIGHT UPPER QUADRANT  COMPARISON: CT abdomen and pelvis 12/25/2017  FINDINGS: Gallbladder:  Multiple small stones layering in the dependent gallbladder. No gallbladder wall thickening or edema. Murphy's sign is positive.  Common bile duct:  Diameter: 4 mm, normal  Liver:  Diffusely increased parenchymal echotexture consistent with diffuse fatty infiltration. Some focal sparing adjacent to the gallbladder fossa.  IMPRESSION: Cholelithiasis with positive Murphy's sign. These findings are consistent with acute cholecystitis in the appropriate clinical setting. Diffuse fatty infiltration of the  liver.   Electronically Signed By: [name] M.D. On: 12/26/2017 00:17   Results for [name], [name] (MRN [id number]) as of 01/09/2018 12:14 Ref. Range 01/02/2018 16:06 Sodium Latest Ref Range: 135 - 146 mmol/L 140 Potassium Latest Ref Range: 3.5 - 5.3 mmol/L 3.8 Chloride Latest Ref Range: 98 - 110 mmol/L 106 CO2 Latest Ref Range: 20 - 32 mmol/L 24 Glucose Latest Ref Range: 65 - 139 mg/dL 167 (H) BUN Latest Ref Range: 7 - 25 mg/dL 17 Creatinine Latest Ref Range: 0.50 - 0.99 mg/dL 1.07 (H) Calcium Latest Ref Range: 8.6 - 10.4 mg/dL 10.9 (H) BUN/Creatinine Ratio Latest Ref Range: 6 - 22 (calc) 16 AG Ratio Latest Ref Range: 1.0 - 2.5 (calc) 1.2 AST Latest Ref Range: 10 - 35 U/L 23 ALT Latest Ref Range: 6 - 29 U/L 16 Total Protein Latest Ref Range: 6.1 - 8.1 g/dL 7.4 Total Bilirubin Latest Ref Range: 0.2 - 1.2 mg/dL 0.3 GFR, Est Non African American Latest Ref Range: > OR = 60 mL/min/1.70m2 53 (L) GFR, Est African American Latest Ref Range: > OR = 60 mL/min/1.20m2 61 Alkaline phosphatase (APISO) Latest Ref Range: 33 - 130 U/L 57 Globulin Latest Ref Range: 1.9 - 3.7 g/dL (calc) 3.4 WBC Latest Ref Range: 3.8 - 10.8 Thousand/uL 7.5 WBC mixed population Latest Ref Range: 200 - 950 cells/uL 525 RBC Latest Ref Range: 3.80 - 5.10 Million/uL 4.83 Hemoglobin Latest Ref Range: 11.7 - 15.5 g/dL 14.1 HCT Latest Ref Range: 35.0 - 45.0 % 42.5 MCV Latest Ref Range: 80.0 - 100.0 fL 88.0 MCH Latest Ref Range: 27.0 - 33.0 pg 29.2 MCHC Latest Ref Range: 32.0 - 36.0 g/dL 33.2 RDW Latest Ref Range: 11.0 - 15.0 % 12.8 Platelets Latest Ref Range: 140 - 400 Thousand/uL 311 MPV Latest Ref Range: 7.5 - 12.5 fL 10.1 Neutrophils Latest Units: %  50.5 Monocytes Relative Latest Units: % 7.0 Eosinophil Latest Units: % 3.4 Basophil Latest Units: % 1.1 NEUT# Latest Ref Range: 1,500 - 7,800 cells/uL 3,788 Lymphocyte # Latest Ref Range: 850 - 3,900 cells/uL 2,850 Total Lymphocyte Latest Units: %  38.0 Eosinophils Absolute Latest Ref Range: 15 - 500 cells/uL 255 Basophils Absolute Latest Ref Range: 0 - 200 cells/uL 83 PTH, Intact Latest Ref Range: 14 - 64 pg/mL 79 (H) Albumin MSPROF Latest Ref Range: 3.6 - 5.1 g/dL 4.0.  The patient is a 67 year old female.   Allergies Sabino Gasser; 01/09/2018 10:56 AM) Dilaudid *ANALGESICS - OPIOID* Lipitor *ANTIHYPERLIPIDEMICS* Adhesive Tape Ampicillin *PENICILLINS* Penicillins Allergies Reconciled  Medication History Sabino Gasser; 01/09/2018 10:56 AM) MetFORMIN HCl (850MG  Tablet, Oral) Active. Tylenol (325MG  Tablet, Oral) Active. Aspirin (81MG  Tablet, Oral) Active. Calcium (Oral) Specific strength unknown - Active. Flonase (50MCG/ACT Suspension, Nasal) Active. Neurontin (300MG  Capsule, Oral) Active. HydroCHLOROthiazide (12.5MG  Tablet, Oral) Active. Norco (5-325MG  Tablet, Oral) Active. NovoLOG (100UNIT/ML Solution, Subcutaneous) Active. Levothyroxine Sodium (100MCG Tablet, Oral) Active. Losartan Potassium-HCTZ (50-12.5MG  Tablet, Oral) Active. Medications Reconciled    Vitals Sabino Gasser; 01/09/2018 10:57 AM) 01/09/2018 10:56 AM Weight: 206.5 lb Height: 65in Body Surface Area: 2.01 m Body Mass Index: 34.36 kg/m  Temp.: 98.67F(Oral)  Pulse: 75 (Regular)  BP: 130/82 (Sitting, Left Arm, Standard)      Physical Exam (Desiree Crass A. Feliza Diven MD; 01/09/2018 12:16 PM)  General Mental Status-Alert. General Appearance-Consistent with stated age. Hydration-Well hydrated. Voice-Normal.  Head and Neck Head-normocephalic, atraumatic with no lesions or palpable masses.  Chest and Lung Exam Chest and lung exam reveals -quiet, even and easy respiratory effort with no use of accessory muscles and on auscultation, normal breath sounds, no adventitious sounds and normal vocal resonance. Inspection Chest Wall - Normal. Back - normal.  Cardiovascular Cardiovascular examination reveals -on  palpation PMI is normal in location and amplitude, no palpable S3 or S4. Normal cardiac borders., normal heart sounds, regular rate and rhythm with no murmurs, carotid auscultation reveals no bruits and normal pedal pulses bilaterally.  Abdomen Inspection Inspection of the abdomen reveals - No Hernias. Skin - Scar - no surgical scars. Palpation/Percussion Palpation and Percussion of the abdomen reveal - Soft, Non Tender, No Rebound tenderness, No Rigidity (guarding) and No hepatosplenomegaly. Auscultation Auscultation of the abdomen reveals - Bowel sounds normal.  Neurologic Neurologic evaluation reveals -alert and oriented x 3 with no impairment of recent or remote memory. Mental Status-Normal.  Musculoskeletal Normal Exam - Left-Upper Extremity Strength Normal and Lower Extremity Strength Normal. Normal Exam - Right-Upper Extremity Strength Normal, Lower Extremity Weakness.    Assessment & Plan (Roylee Chaffin A. Ladiamond Gallina MD; 01/09/2018 12:17 PM)  SYMPTOMATIC CHOLELITHIASIS (K80.20) Impression: Patient with pain and a large gallstone in the gallbladder. She had a history of multiple attacks and he seemed to be worsening. Recommend laparoscopic cholecystectomy with possible cholangiogram. The procedure has been discussed with the patient. Risks of laparoscopic cholecystectomy include bleeding, infection, bile duct injury, leak, death, open surgery, diarrhea, other surgery, organ injury, blood vessel injury, DVT, and additional care.  Current Plans You are being scheduled for surgery- Our schedulers will call you.  You should hear from our office's scheduling department within 5 working days about the location, date, and time of surgery. We try to make accommodations for patient's preferences in scheduling surgery, but sometimes the OR schedule or the surgeon's schedule prevents [place] from making those accommodations.  If you have not heard from our office [phone]) in 5 working  days, call  the office and ask for your surgeon's nurse.  If you have other questions about your diagnosis, plan, or surgery, call the office and ask for your surgeon's nurse.  Pt Education - Pamphlet Given - Laparoscopic Gallbladder Surgery: discussed with patient and provided information. Written instructions provided The anatomy & physiology of hepatobiliary & pancreatic function was discussed. The pathophysiology of gallbladder dysfunction was discussed. Natural history risks without surgery was discussed. I feel the risks of no intervention will lead to serious problems that outweigh the operative risks; therefore, I recommended cholecystectomy to remove the pathology. I explained laparoscopic techniques with possible need for an open approach. Probable cholangiogram to evaluate the bilary tract was explained as well.  Risks such as bleeding, infection, abscess, leak, injury to other organs, need for further treatment, heart attack, death, and other risks were discussed. I noted a good likelihood this will help address the problem. Possibility that this will not correct all abdominal symptoms was explained. Goals of post-operative recovery were discussed as well. We will work to minimize complications. An educational handout further explaining the pathology and treatment options was given as well. Questions were answered. The patient expresses understanding & wishes to proceed with surgery.  Pt Education - [facility] Laparosopic Post Op HCI (Gross) Pt Education - [facility] Good Bowel Health (Gross) Pt Education - Laparoscopic Cholecystectomy: gallbladder  HYPERCALCEMIA (E83.52) Impression: Mildly elevated PTH  Calcium levels are in the 10 range  She is being referred to endocrinology. She could have primary hyperparathyroidism and may benefit from sestamibi study and/or CT angiogram of the neck for further evaluation. This can be dealt with later. She is asymptomatic otherwise.

## 2018-01-18 NOTE — Pre-Procedure Instructions (Signed)
[name]  01/18/2018      [id number] [place], [place] - [place] [address] [place] [address] Phone: [phone] Fax: [phone]  [facility] - [address], [place] [address] Phone: [phone] Fax: [phone]  [place], [place] - [name] [place] [address] Phone: [phone] Fax: [phone]    Your procedure is scheduled on January 24, 2018.  Report to [facility] Admitting at 830 AM.  Call this number if you have problems the morning of surgery:  [phone]   Remember:  Do not eat or drink after midnight.  You may drink clear liquids until 730 AM-3 hours prior to procedure start time.  Clear liquids allowed are:   Water, Juice (non-citric and without pulp), Carbonated beverages, Clear Tea, Black Coffee only, Plain Jell-O only, Gatorade and Plain Popsicles only- NO MILK PRODUCTS    Take these medicines the morning of surgery with A SIP OF WATER  Amlodipine (norvasc) Esomeprazole (nexium) Gabapentin (neurontin) Levothyroxine (synthroid) Tylenol-if needed flonase-if needed Hydrocodone-acetaminophen-if needed  Follow your surgeon's instructions on when to hold/resume aspirin.  If no instructions were given call the office to determine how they would like to you take aspirin  7 days prior to surgery STOP taking any Aspirin (unless otherwise instructed by your surgeon), Aleve, Naproxen, Ibuprofen, Motrin, Advil, Goody's, BC's, all herbal medications, fish oil, and all vitamins  WHAT DO I DO ABOUT MY DIABETES MEDICATION?  [name] Kitchen Do not take oral diabetes medicines (pills) the morning of surgery-metformin (glucophage).  . THE NIGHT BEFORE SURGERY, take 24 units of Novolin 70/30 insulin-70% of your normal dose.       . THE MORNING OF SURGERY, take  no insulin.  . The day of surgery, do not take other diabetes injectables, including Byetta (exenatide), Bydureon (exenatide ER), Victoza (liraglutide), or Trulicity (dulaglutide).  Reviewed and Endorsed by [facility] Patient Education Committee, August 2015  How to Manage Your Diabetes Before and After Surgery  Why is it important to control my blood sugar before and after surgery? . Improving blood sugar levels before and after surgery helps healing and can limit problems. . A way of improving blood sugar control is eating a healthy diet by: o  Eating less sugar and carbohydrates o  Increasing activity/exercise o  Talking with your doctor about reaching your blood sugar goals . High blood sugars (greater than 180 mg/dL) can raise your risk of infections and slow your recovery, so you will need to focus on controlling your diabetes during the weeks before surgery. . Make sure that the doctor who takes care of your diabetes knows about your planned surgery including the date and location.  How do I manage my blood sugar before surgery? . Check your blood sugar at least 4 times a day, starting 2 days before surgery, to make sure that the level is not too high or low. o Check your blood sugar the morning of your surgery when you wake up and every 2 hours until you get to the Short Stay unit. . If your blood sugar is less than 70 mg/dL, you will need to treat for low blood sugar: o Do not take insulin. o Treat a low blood sugar (less than 70 mg/dL) with  cup of clear juice (cranberry or apple), 4 glucose tablets,  OR glucose gel. Recheck blood sugar in 15 minutes after treatment (to make sure it is greater than 70 mg/dL). If your blood sugar is not greater than 70 mg/dL on recheck, call [phone] o  for further instructions. . Report your blood sugar to the short stay nurse when you get to Short Stay.  . If you are admitted to the hospital after surgery: o Your blood sugar will be  checked by the staff and you will probably be given insulin after surgery (instead of oral diabetes medicines) to make sure you have good blood sugar levels. o The goal for blood sugar control after surgery is 80-180 mg/dL.   [facility]- Preparing For Surgery  Before surgery, you can play an important role. Because skin is not sterile, your skin needs to be as free of germs as possible. You can reduce the number of germs on your skin by washing with CHG (chlorahexidine gluconate) Soap before surgery.  CHG is an antiseptic cleaner which kills germs and bonds with the skin to continue killing germs even after washing.    Oral Hygiene is also important to reduce your risk of infection.  Remember - BRUSH YOUR TEETH THE MORNING OF SURGERY WITH YOUR REGULAR TOOTHPASTE  Please do not use if you have an allergy to CHG or antibacterial soaps. If your skin becomes reddened/irritated stop using the CHG.  Do not shave (including legs and underarms) for at least 48 hours prior to first CHG shower. It is OK to shave your face.  Please follow these instructions carefully.   1. Shower the NIGHT BEFORE SURGERY and the MORNING OF SURGERY with CHG.   2. If you chose to wash your hair, wash your hair first as usual with your normal shampoo.  3. After you shampoo, rinse your hair and body thoroughly to remove the shampoo.  4. Use CHG as you would any other liquid soap. You can apply CHG directly to the skin and wash gently with a scrungie or a clean washcloth.   5. Apply the CHG Soap to your body ONLY FROM THE NECK DOWN.  Do not use on open wounds or open sores. Avoid contact with your eyes, ears, mouth and genitals (private parts). Wash Face and genitals (private parts)  with your normal soap.  6. Wash thoroughly, paying special attention to the area where your surgery will be performed.  7. Thoroughly rinse your body with warm water from the neck down.  8. DO NOT shower/wash with your normal soap after  using and rinsing off the CHG Soap.  9. Pat yourself dry with a CLEAN TOWEL.  10. Wear CLEAN PAJAMAS to bed the night before surgery, wear comfortable clothes the morning of surgery  11. Place CLEAN SHEETS on your bed the night of your first shower and DO NOT SLEEP WITH PETS.  Day of Surgery:  Do not apply any deodorants/lotions.  Please wear clean clothes to the hospital/surgery center.   Remember to brush your teeth WITH YOUR REGULAR TOOTHPASTE.              Do not wear jewelry, make-up or nail polish.  Do not wear lotions, powders, or perfumes, or deodorant.  Do not shave 48 hours prior to surgery.    Do not bring valuables to the hospital.   [facility] is not responsible for any belongings or valuables.  Contacts, dentures or bridgework may not be worn into surgery.  Leave your suitcase in the car.  After surgery  it may be brought to your room.  For patients admitted to the hospital, discharge time will be determined by your treatment team.  Patients discharged the day of surgery will not be allowed to drive home.   Please read over the following fact sheets that you were given. Coughing and Deep Breathing and Surgical Site Infection Prevention

## 2018-01-18 NOTE — Progress Notes (Addendum)
PCP: Jenna Luo, MD  Cardiologist: previously saw Dr. John Giovanni one presently  EKG: 12/27/17 in EPIC  Stress test: 10/2015 in EPIC  ECHO: 03/05/17 in EPIC  Cardiac Cath: 2005/2007?  Chest x-ray: 12/15/17 in Epic  Patient on ASA 81 mg-no instructions given, pt advised to call Dr. Josetta Huddle office after PAT appointment to get instructions

## 2018-01-19 ENCOUNTER — Encounter (HOSPITAL_COMMUNITY)
Admission: RE | Admit: 2018-01-19 | Discharge: 2018-01-19 | Disposition: A | Discharge status: Home / Self Care | Payer: Medicare HMO | Source: Ambulatory Visit | Attending: Surgery | Admitting: Surgery

## 2018-01-19 ENCOUNTER — Other Ambulatory Visit: Payer: Self-pay

## 2018-01-19 ENCOUNTER — Encounter (HOSPITAL_COMMUNITY): Payer: Self-pay

## 2018-01-19 DIAGNOSIS — Z79890 Hormone replacement therapy (postmenopausal): Secondary | ICD-10-CM | POA: Diagnosis not present

## 2018-01-19 DIAGNOSIS — I10 Essential (primary) hypertension: Secondary | ICD-10-CM | POA: Diagnosis not present

## 2018-01-19 DIAGNOSIS — Z01812 Encounter for preprocedural laboratory examination: Secondary | ICD-10-CM | POA: Diagnosis not present

## 2018-01-19 DIAGNOSIS — K8080 Other cholelithiasis without obstruction: Secondary | ICD-10-CM | POA: Insufficient documentation

## 2018-01-19 DIAGNOSIS — I429 Cardiomyopathy, unspecified: Secondary | ICD-10-CM | POA: Diagnosis not present

## 2018-01-19 DIAGNOSIS — Z7982 Long term (current) use of aspirin: Secondary | ICD-10-CM | POA: Diagnosis not present

## 2018-01-19 DIAGNOSIS — E1165 Type 2 diabetes mellitus with hyperglycemia: Secondary | ICD-10-CM | POA: Insufficient documentation

## 2018-01-19 DIAGNOSIS — Z79891 Long term (current) use of opiate analgesic: Secondary | ICD-10-CM | POA: Insufficient documentation

## 2018-01-19 DIAGNOSIS — Z9011 Acquired absence of right breast and nipple: Secondary | ICD-10-CM | POA: Insufficient documentation

## 2018-01-19 DIAGNOSIS — Z90710 Acquired absence of both cervix and uterus: Secondary | ICD-10-CM | POA: Insufficient documentation

## 2018-01-19 DIAGNOSIS — Z9889 Other specified postprocedural states: Secondary | ICD-10-CM | POA: Insufficient documentation

## 2018-01-19 DIAGNOSIS — Z79899 Other long term (current) drug therapy: Secondary | ICD-10-CM | POA: Diagnosis not present

## 2018-01-19 DIAGNOSIS — Z794 Long term (current) use of insulin: Secondary | ICD-10-CM | POA: Diagnosis not present

## 2018-01-19 DIAGNOSIS — E213 Hyperparathyroidism, unspecified: Secondary | ICD-10-CM | POA: Diagnosis not present

## 2018-01-19 HISTORY — DX: Calculus of gallbladder without cholecystitis without obstruction: K80.20

## 2018-01-19 LAB — GLUCOSE, CAPILLARY: Glucose-Capillary: 189 mg/dL — ABNORMAL HIGH (ref 70–99)

## 2018-01-19 LAB — COMPREHENSIVE METABOLIC PANEL
ALT: 28 U/L (ref 0–44)
ANION GAP: 8 (ref 5–15)
AST: 33 U/L (ref 15–41)
Albumin: 3.8 g/dL (ref 3.5–5.0)
Alkaline Phosphatase: 53 U/L (ref 38–126)
BUN: 18 mg/dL (ref 8–23)
CALCIUM: 10.8 mg/dL — AB (ref 8.9–10.3)
CHLORIDE: 110 mmol/L (ref 98–111)
CO2: 22 mmol/L (ref 22–32)
Creatinine, Ser: 0.85 mg/dL (ref 0.44–1.00)
Glucose, Bld: 197 mg/dL — ABNORMAL HIGH (ref 70–99)
Potassium: 3.9 mmol/L (ref 3.5–5.1)
SODIUM: 140 mmol/L (ref 135–145)
Total Bilirubin: 0.7 mg/dL (ref 0.3–1.2)
Total Protein: 7.7 g/dL (ref 6.5–8.1)

## 2018-01-19 LAB — HEMOGLOBIN A1C
HEMOGLOBIN A1C: 10.4 % — AB (ref 4.8–5.6)
Mean Plasma Glucose: 251.78 mg/dL

## 2018-01-19 LAB — CBC WITH DIFFERENTIAL/PLATELET
ABS IMMATURE GRANULOCYTES: 0.01 10*3/uL (ref 0.00–0.07)
BASOS PCT: 1 %
Basophils Absolute: 0.1 10*3/uL (ref 0.0–0.1)
EOS ABS: 0.3 10*3/uL (ref 0.0–0.5)
EOS PCT: 4 %
HCT: 43.3 % (ref 36.0–46.0)
Hemoglobin: 13.6 g/dL (ref 12.0–15.0)
Immature Granulocytes: 0 %
Lymphocytes Relative: 31 %
Lymphs Abs: 2.3 10*3/uL (ref 0.7–4.0)
MCH: 28.8 pg (ref 26.0–34.0)
MCHC: 31.4 g/dL (ref 30.0–36.0)
MCV: 91.7 fL (ref 80.0–100.0)
MONOS PCT: 7 %
Monocytes Absolute: 0.5 10*3/uL (ref 0.1–1.0)
NEUTROS ABS: 4.1 10*3/uL (ref 1.7–7.7)
NRBC: 0 % (ref 0.0–0.2)
Neutrophils Relative %: 57 %
PLATELETS: 298 10*3/uL (ref 150–400)
RBC: 4.72 MIL/uL (ref 3.87–5.11)
RDW: 13.5 % (ref 11.5–15.5)
WBC: 7.3 10*3/uL (ref 4.0–10.5)

## 2018-01-22 NOTE — Progress Notes (Signed)
Anesthesia Chart Review:   Case:  [id number] Date/Time:  01/24/18 1015   Procedure:  LAPAROSCOPIC CHOLECYSTECTOMY WITH INTRAOPERATIVE CHOLANGIOGRAM (N/A )   Anesthesia type:  General   Pre-op diagnosis:  GALLSTONES   Location:  [place] OR ROOM 09 / [place]   Surgeon:  Erroll Luna, MD      DISCUSSION:  - Pt is a 67 year old female with hx cardiomyopathy (2007; resolved by 2009; no cardiomyopathy on echo 03/05/17), HTN, DM, new hyperparathyroidism.   - Has initial appointment with endocrinology 02/27/18 for uncontrolled DM and hyperparathyroidism.   - I notified Hadelyne in Dr. Josetta Huddle office of pt's uncontrolled DM (a1c 10.4)   VS: BP (!) 143/65   Pulse 73   Temp 36.9 C   Resp 18   Ht 5' 4" (1.626 m)   Wt 92.3 kg   SpO2 97%   BMI 34.93 kg/m    PROVIDERS: - PCP is Susy Frizzle, MD - Used to see cardiologist Allegra Lai, MD. Last office visit 04/26/17.  PRN f/u recommended.    LABS:  - HbA1c 10.4, glucose 189. Pt has been referred to endocrinology for uncontrolled DM.   - Calcium 10.8 is consistent with recent prior results.  Per Dr. Josetta Huddle H&P, pt has been referred to endocrinology for hyperparathyroid work up.     (all labs ordered are listed, but only abnormal results are displayed)  Labs Reviewed  GLUCOSE, CAPILLARY - Abnormal; Notable for the following components:      Result Value   Glucose-Capillary 189 (*)    All other components within normal limits  HEMOGLOBIN A1C - Abnormal; Notable for the following components:   Hgb A1c MFr Bld 10.4 (*)    All other components within normal limits  COMPREHENSIVE METABOLIC PANEL - Abnormal; Notable for the following components:   Glucose, Bld 197 (*)    Calcium 10.8 (*)    All other components within normal limits  CBC WITH DIFFERENTIAL/PLATELET     IMAGES:  CXR 12/25/17: No active cardiopulmonary disease.   EKG 12/25/17: NSR. Cannot rule out Anterior infarct, age undetermined. Appears stable compared with  prior EKG 08/16/17.    CV:  Echo 03/05/17:  - Left ventricle: The cavity size was normal. Wall thickness was increased in a pattern of mild LVH. Systolic function was normal. The estimated ejection fraction was in the range of 55% to 60%. Wall motion was normal; there were no regional wall motion abnormalities. - Aortic valve: There was trivial regurgitation. - Mitral valve: Calcified annulus. Mildly thickened leaflets . - Left atrium: The atrium was mildly dilated. - Atrial septum: No defect or patent foramen ovale was identified.  Nuclear stress test 10/29/15:   Nuclear stress EF: 57%.  There was no ST segment deviation noted during stress.  Defect 1: There is a medium defect of moderate severity present in the mid anterolateral and apex location.  Defect 2: There is a medium defect of moderate severity present in the basal inferolateral location.  This is a low risk study.  The left ventricular ejection fraction is normal (55-65%). - Low risk stress nuclear study with probable breast attenuation and thinning of the basal inferior lateral wall; no ischemia; EF 57 with normal wall motion.   Past Medical History:  Diagnosis Date  . Anginal pain ([place]) 03/19/2012   saw Dr. Cathie Olden .. she thinks its more related to stomach issues  . Arthritis    "all over; mostly in my back" (03/20/2012)  .  Breast cancer ([place])    "right" (03/20/2012)  . Cardiomyopathy   . Chest pain 06/2005   Hospitalized, dystolic dysfunction  . Chronic lower back pain    "have 2 herniated discs; going to have to have a fusion" (03/20/2012)  . Family history of anesthesia complication    "father has nausea and vomiting"  . Gallstones   . GERD (gastroesophageal reflux disease)   . H/O hiatal hernia   . History of kidney stones   . History of right mastectomy   . Hypertension    "patient states never had HTN, takes Hyzaar for heart  . Hypothyroidism   . Irritable bowel syndrome   . Migraines    "it's been  a long time since I've had one" (03/20/2012)  . Osteoarthritis   . Peripheral neuropathy   . PONV (postoperative nausea and vomiting)   . Sciatica   . Type II diabetes mellitus ([place])     Past Surgical History:  Procedure Laterality Date  . ABDOMINAL HYSTERECTOMY  1993  . adenosin cardiolite  07/2005   (+) wall motion abnormality  . AXILLARY LYMPH NODE DISSECTION  2003   squamous cell cancer  . BACK SURGERY    . CARDIAC CATHETERIZATION  ? 2005 / 2007  . CT abdomen and pelvis  02/2010   Same  . ESOPHAGOGASTRODUODENOSCOPY  2005   GERD  . KNEE ARTHROSCOPY  1990's   "right" (03/20/2012)  . LUMBAR FUSION N/A 08/22/2014   Procedure: Right L2-3 and right L1-2 transforaminal lumbar interbody fusion with pedicle screws, rods, sleeves, cages, local bone graft, Vivigen, cancellous chips;  Surgeon: Jessy Oto, MD;  Location: [facility];  Service: Orthopedics;  Laterality: N/A;  . LUMBAR LAMINECTOMY/DECOMPRESSION MICRODISCECTOMY N/A 02/04/2014   Procedure: RIGHT L2-3 MICRODISCECTOMY ;  Surgeon: Jessy Oto, MD;  Location: [facility];  Service: Orthopedics;  Laterality: N/A;  . MASTECTOMY WITH AXILLARY LYMPH NODE DISSECTION  12/2003   "right" (03/20/2012)  . POSTERIOR CERVICAL FUSION/FORAMINOTOMY  2001  . SHOULDER ARTHROSCOPY W/ ROTATOR CUFF REPAIR  2003   Left  . THYROIDECTOMY  1977   Right  . TOTAL KNEE ARTHROPLASTY  2000   Right  . [place] of abdomen  07/2005   Fatty liver / kidney stones    MEDICATIONS: . acetaminophen (TYLENOL) 500 MG tablet  . amLODipine (NORVASC) 10 MG tablet  . aspirin 81 MG tablet  . Blood Glucose Calibration (TRUE METRIX LEVEL 2) Normal SOLN  . Blood Glucose Monitoring Suppl (TRUE METRIX AIR GLUCOSE METER) w/Device KIT  . esomeprazole (NEXIUM) 40 MG capsule  . fluticasone (FLONASE) 50 MCG/ACT nasal spray  . gabapentin (NEURONTIN) 300 MG capsule  . glucose blood (TRUE METRIX BLOOD GLUCOSE TEST) test strip  . HYDROcodone-acetaminophen (NORCO/VICODIN) 5-325 MG tablet   . insulin NPH-regular Human (NOVOLIN 70/30 RELION) (70-30) 100 UNIT/ML injection  . levothyroxine (SYNTHROID, LEVOTHROID) 100 MCG tablet  . lidocaine (XYLOCAINE) 2 % solution  . losartan (COZAAR) 50 MG tablet  . metFORMIN (GLUCOPHAGE) 850 MG tablet  . polyethylene glycol (MIRALAX / GLYCOLAX) packet  . TRUEPLUS LANCETS 33G MISC   No current facility-administered medications for this encounter.     If glucose acceptable day of surgery, I anticipate pt can proceed with surgery as scheduled.  Willeen Cass, FNP-BC [facility]/Anesthesiology Phone: [phone] 01/22/2018 11:41 AM

## 2018-01-24 ENCOUNTER — Encounter (HOSPITAL_COMMUNITY)
Admission: RE | Disposition: A | Discharge status: Home / Self Care | Payer: Self-pay | Source: Ambulatory Visit | Attending: Surgery

## 2018-01-24 ENCOUNTER — Ambulatory Visit (HOSPITAL_COMMUNITY): Payer: Medicare HMO | Admitting: Certified Registered Nurse Anesthetist

## 2018-01-24 ENCOUNTER — Ambulatory Visit (HOSPITAL_COMMUNITY)
Admission: RE | Admit: 2018-01-24 | Discharge: 2018-01-24 | Disposition: A | Discharge status: Home / Self Care | Payer: Medicare HMO | Source: Ambulatory Visit | Attending: Surgery | Admitting: Surgery

## 2018-01-24 ENCOUNTER — Ambulatory Visit (HOSPITAL_COMMUNITY): Payer: Medicare HMO | Admitting: Emergency Medicine

## 2018-01-24 ENCOUNTER — Encounter (HOSPITAL_COMMUNITY): Payer: Self-pay

## 2018-01-24 ENCOUNTER — Other Ambulatory Visit: Payer: Self-pay

## 2018-01-24 DIAGNOSIS — Z794 Long term (current) use of insulin: Secondary | ICD-10-CM | POA: Diagnosis not present

## 2018-01-24 DIAGNOSIS — K8020 Calculus of gallbladder without cholecystitis without obstruction: Secondary | ICD-10-CM | POA: Diagnosis not present

## 2018-01-24 DIAGNOSIS — I209 Angina pectoris, unspecified: Secondary | ICD-10-CM | POA: Insufficient documentation

## 2018-01-24 DIAGNOSIS — I10 Essential (primary) hypertension: Secondary | ICD-10-CM | POA: Insufficient documentation

## 2018-01-24 DIAGNOSIS — Z7982 Long term (current) use of aspirin: Secondary | ICD-10-CM | POA: Insufficient documentation

## 2018-01-24 DIAGNOSIS — Z79899 Other long term (current) drug therapy: Secondary | ICD-10-CM | POA: Insufficient documentation

## 2018-01-24 DIAGNOSIS — I5032 Chronic diastolic (congestive) heart failure: Secondary | ICD-10-CM | POA: Diagnosis not present

## 2018-01-24 DIAGNOSIS — Z880 Allergy status to penicillin: Secondary | ICD-10-CM | POA: Insufficient documentation

## 2018-01-24 DIAGNOSIS — E669 Obesity, unspecified: Secondary | ICD-10-CM | POA: Diagnosis not present

## 2018-01-24 DIAGNOSIS — K828 Other specified diseases of gallbladder: Secondary | ICD-10-CM | POA: Insufficient documentation

## 2018-01-24 DIAGNOSIS — I110 Hypertensive heart disease with heart failure: Secondary | ICD-10-CM | POA: Diagnosis not present

## 2018-01-24 DIAGNOSIS — K8010 Calculus of gallbladder with chronic cholecystitis without obstruction: Secondary | ICD-10-CM | POA: Diagnosis not present

## 2018-01-24 DIAGNOSIS — Z91048 Other nonmedicinal substance allergy status: Secondary | ICD-10-CM | POA: Insufficient documentation

## 2018-01-24 DIAGNOSIS — Z888 Allergy status to other drugs, medicaments and biological substances status: Secondary | ICD-10-CM | POA: Insufficient documentation

## 2018-01-24 DIAGNOSIS — Z6834 Body mass index (BMI) 34.0-34.9, adult: Secondary | ICD-10-CM | POA: Diagnosis not present

## 2018-01-24 DIAGNOSIS — E119 Type 2 diabetes mellitus without complications: Secondary | ICD-10-CM | POA: Insufficient documentation

## 2018-01-24 DIAGNOSIS — E039 Hypothyroidism, unspecified: Secondary | ICD-10-CM | POA: Diagnosis not present

## 2018-01-24 HISTORY — PX: CHOLECYSTECTOMY: SHX55

## 2018-01-24 LAB — GLUCOSE, CAPILLARY
GLUCOSE-CAPILLARY: 127 mg/dL — AB (ref 70–99)
GLUCOSE-CAPILLARY: 165 mg/dL — AB (ref 70–99)

## 2018-01-24 SURGERY — LAPAROSCOPIC CHOLECYSTECTOMY
Anesthesia: General | Site: Abdomen

## 2018-01-24 MED ORDER — PROMETHAZINE HCL 25 MG/ML IJ SOLN
INTRAMUSCULAR | Status: AC
  Administered 2018-01-24: 12.5 mg via INTRAVENOUS
  Filled 2018-01-24: qty 1

## 2018-01-24 MED ORDER — MIDAZOLAM HCL 2 MG/2ML IJ SOLN
INTRAMUSCULAR | Status: DC | PRN
  Administered 2018-01-24: 2 mg via INTRAVENOUS

## 2018-01-24 MED ORDER — SODIUM CHLORIDE 0.9 % IV SOLN
INTRAVENOUS | Status: DC | PRN
  Administered 2018-01-24: 20 ug/min via INTRAVENOUS

## 2018-01-24 MED ORDER — 0.9 % SODIUM CHLORIDE (POUR BTL) OPTIME
TOPICAL | Status: DC | PRN
  Administered 2018-01-24: 1000 mL

## 2018-01-24 MED ORDER — LIDOCAINE 2% (20 MG/ML) 5 ML SYRINGE
INTRAMUSCULAR | Status: DC | PRN
  Administered 2018-01-24: 100 mg via INTRAVENOUS

## 2018-01-24 MED ORDER — FENTANYL CITRATE (PF) 250 MCG/5ML IJ SOLN
INTRAMUSCULAR | Status: AC
  Filled 2018-01-24: qty 5

## 2018-01-24 MED ORDER — EPHEDRINE SULFATE 50 MG/ML IJ SOLN
INTRAMUSCULAR | Status: DC | PRN
  Administered 2018-01-24: 5 mg via INTRAVENOUS

## 2018-01-24 MED ORDER — FENTANYL CITRATE (PF) 100 MCG/2ML IJ SOLN
25.0000 ug | INTRAMUSCULAR | Status: DC | PRN
  Administered 2018-01-24: 50 ug via INTRAVENOUS

## 2018-01-24 MED ORDER — MEPERIDINE HCL 50 MG/ML IJ SOLN: 6.2500 mg | INTRAMUSCULAR | Status: DC | PRN

## 2018-01-24 MED ORDER — ONDANSETRON HCL 4 MG/2ML IJ SOLN
INTRAMUSCULAR | Status: AC
  Filled 2018-01-24: qty 2

## 2018-01-24 MED ORDER — PHENYLEPHRINE 40 MCG/ML (10ML) SYRINGE FOR IV PUSH (FOR BLOOD PRESSURE SUPPORT)
PREFILLED_SYRINGE | INTRAVENOUS | Status: DC | PRN
  Administered 2018-01-24: 80 ug via INTRAVENOUS

## 2018-01-24 MED ORDER — SUGAMMADEX SODIUM 200 MG/2ML IV SOLN
INTRAVENOUS | Status: DC | PRN
  Administered 2018-01-24: 375 mg via INTRAVENOUS

## 2018-01-24 MED ORDER — GABAPENTIN 300 MG PO CAPS
300.0000 mg | ORAL_CAPSULE | ORAL | Status: AC
  Administered 2018-01-24: 300 mg via ORAL
  Filled 2018-01-24: qty 1

## 2018-01-24 MED ORDER — SCOPOLAMINE 1 MG/3DAYS TD PT72
MEDICATED_PATCH | TRANSDERMAL | Status: DC | PRN
  Administered 2018-01-24: 1 via TRANSDERMAL

## 2018-01-24 MED ORDER — ACETAMINOPHEN 10 MG/ML IV SOLN: 1000.0000 mg | Freq: Once | INTRAVENOUS | Status: DC | PRN

## 2018-01-24 MED ORDER — DEXAMETHASONE SODIUM PHOSPHATE 10 MG/ML IJ SOLN
INTRAMUSCULAR | Status: AC
  Filled 2018-01-24: qty 1

## 2018-01-24 MED ORDER — ACETAMINOPHEN 500 MG PO TABS
1000.0000 mg | ORAL_TABLET | ORAL | Status: AC
  Administered 2018-01-24: 1000 mg via ORAL
  Filled 2018-01-24: qty 2

## 2018-01-24 MED ORDER — PROPOFOL 10 MG/ML IV BOLUS
INTRAVENOUS | Status: DC | PRN
  Administered 2018-01-24: 150 mg via INTRAVENOUS

## 2018-01-24 MED ORDER — SODIUM CHLORIDE 0.9 % IR SOLN
Status: DC | PRN
  Administered 2018-01-24 (×2): 1000 mL

## 2018-01-24 MED ORDER — OXYCODONE HCL 5 MG PO TABS: 5.0000 mg | ORAL_TABLET | Freq: Four times a day (QID) | ORAL | 0 refills | Status: DC | PRN

## 2018-01-24 MED ORDER — DEXAMETHASONE SODIUM PHOSPHATE 10 MG/ML IJ SOLN
INTRAMUSCULAR | Status: DC | PRN
  Administered 2018-01-24: 10 mg via INTRAVENOUS

## 2018-01-24 MED ORDER — ROCURONIUM BROMIDE 50 MG/5ML IV SOSY
PREFILLED_SYRINGE | INTRAVENOUS | Status: AC
  Filled 2018-01-24: qty 5

## 2018-01-24 MED ORDER — LACTATED RINGERS IV SOLN
INTRAVENOUS | Status: DC
  Administered 2018-01-24: 10:00:00 via INTRAVENOUS

## 2018-01-24 MED ORDER — ROCURONIUM BROMIDE 10 MG/ML (PF) SYRINGE
PREFILLED_SYRINGE | INTRAVENOUS | Status: DC | PRN
  Administered 2018-01-24: 20 mg via INTRAVENOUS
  Administered 2018-01-24: 40 mg via INTRAVENOUS

## 2018-01-24 MED ORDER — PROPOFOL 500 MG/50ML IV EMUL
INTRAVENOUS | Status: DC | PRN
  Administered 2018-01-24: 25 ug/kg/min via INTRAVENOUS

## 2018-01-24 MED ORDER — HEMOSTATIC AGENTS (NO CHARGE) OPTIME
TOPICAL | Status: DC | PRN
  Administered 2018-01-24: 1 via TOPICAL

## 2018-01-24 MED ORDER — CHLORHEXIDINE GLUCONATE CLOTH 2 % EX PADS: 6.0000 | MEDICATED_PAD | Freq: Once | CUTANEOUS | Status: DC

## 2018-01-24 MED ORDER — FENTANYL CITRATE (PF) 100 MCG/2ML IJ SOLN
INTRAMUSCULAR | Status: AC
  Administered 2018-01-24: 50 ug via INTRAVENOUS
  Filled 2018-01-24: qty 2

## 2018-01-24 MED ORDER — LIDOCAINE 2% (20 MG/ML) 5 ML SYRINGE
INTRAMUSCULAR | Status: AC
  Filled 2018-01-24: qty 5

## 2018-01-24 MED ORDER — FENTANYL CITRATE (PF) 250 MCG/5ML IJ SOLN
INTRAMUSCULAR | Status: DC | PRN
  Administered 2018-01-24: 100 ug via INTRAVENOUS
  Administered 2018-01-24: 50 ug via INTRAVENOUS

## 2018-01-24 MED ORDER — MIDAZOLAM HCL 2 MG/2ML IJ SOLN
INTRAMUSCULAR | Status: AC
  Filled 2018-01-24: qty 2

## 2018-01-24 MED ORDER — ONDANSETRON HCL 4 MG/2ML IJ SOLN
INTRAMUSCULAR | Status: DC | PRN
  Administered 2018-01-24: 4 mg via INTRAVENOUS

## 2018-01-24 MED ORDER — IOPAMIDOL (ISOVUE-300) INJECTION 61%
INTRAVENOUS | Status: AC
  Filled 2018-01-24: qty 50

## 2018-01-24 MED ORDER — BUPIVACAINE-EPINEPHRINE 0.25% -1:200000 IJ SOLN
INTRAMUSCULAR | Status: DC | PRN
  Administered 2018-01-24: 12 mL

## 2018-01-24 MED ORDER — BUPIVACAINE-EPINEPHRINE (PF) 0.25% -1:200000 IJ SOLN
INTRAMUSCULAR | Status: AC
  Filled 2018-01-24: qty 30

## 2018-01-24 MED ORDER — CLINDAMYCIN PHOSPHATE 900 MG/50ML IV SOLN
900.0000 mg | INTRAVENOUS | Status: AC
  Administered 2018-01-24: 900 mg via INTRAVENOUS
  Filled 2018-01-24: qty 50

## 2018-01-24 MED ORDER — CELECOXIB 200 MG PO CAPS
200.0000 mg | ORAL_CAPSULE | ORAL | Status: AC
  Administered 2018-01-24: 200 mg via ORAL
  Filled 2018-01-24: qty 1

## 2018-01-24 MED ORDER — PROMETHAZINE HCL 25 MG/ML IJ SOLN
6.2500 mg | INTRAMUSCULAR | Status: DC | PRN
  Administered 2018-01-24: 12.5 mg via INTRAVENOUS

## 2018-01-24 MED ORDER — IBUPROFEN 800 MG PO TABS: 800.0000 mg | ORAL_TABLET | Freq: Three times a day (TID) | ORAL | 0 refills | Status: DC | PRN

## 2018-01-24 SURGICAL SUPPLY — 46 items
ADH SKN CLS APL DERMABOND .7 (GAUZE/BANDAGES/DRESSINGS) ×2
APPLIER CLIP ROT 10 11.4 M/L (STAPLE) ×3
APR CLP MED LRG 11.4X10 (STAPLE) ×2
BAG SPEC RTRVL 10 TROC 200 (ENDOMECHANICALS) ×2
BLADE CLIPPER SURG (BLADE) IMPLANT
CANISTER SUCT 3000ML PPV (MISCELLANEOUS) ×3 IMPLANT
CHLORAPREP W/TINT 26ML (MISCELLANEOUS) ×3 IMPLANT
CLIP APPLIE ROT 10 11.4 M/L (STAPLE) ×2 IMPLANT
COVER MAYO STAND STRL (DRAPES) ×1 IMPLANT
COVER SURGICAL LIGHT HANDLE (MISCELLANEOUS) ×3 IMPLANT
COVER WAND RF STERILE (DRAPES) ×1 IMPLANT
DERMABOND ADVANCED (GAUZE/BANDAGES/DRESSINGS) ×1
DERMABOND ADVANCED .7 DNX12 (GAUZE/BANDAGES/DRESSINGS) ×2 IMPLANT
DRAPE C-ARM 42X72 X-RAY (DRAPES) ×1 IMPLANT
DRAPE WARM FLUID 44X44 (DRAPE) ×1 IMPLANT
ELECT REM PT RETURN 9FT ADLT (ELECTROSURGICAL) ×3
ELECTRODE REM PT RTRN 9FT ADLT (ELECTROSURGICAL) ×2 IMPLANT
GLOVE BIO SURGEON STRL SZ8 (GLOVE) ×1 IMPLANT
GLOVE BIOGEL PI IND STRL 8 (GLOVE) ×2 IMPLANT
GLOVE BIOGEL PI INDICATOR 8 (GLOVE) ×1
GOWN STRL REUS W/ TWL LRG LVL3 (GOWN DISPOSABLE) ×4 IMPLANT
GOWN STRL REUS W/ TWL XL LVL3 (GOWN DISPOSABLE) ×3 IMPLANT
GOWN STRL REUS W/TWL LRG LVL3 (GOWN DISPOSABLE) ×6
GOWN STRL REUS W/TWL XL LVL3 (GOWN DISPOSABLE) ×6
HEMOSTAT SNOW SURGICEL 2X4 (HEMOSTASIS) ×2 IMPLANT
KIT BASIN OR (CUSTOM PROCEDURE TRAY) ×3 IMPLANT
KIT TURNOVER KIT B (KITS) ×3 IMPLANT
NS IRRIG 1000ML POUR BTL (IV SOLUTION) ×3 IMPLANT
PAD ARMBOARD 7.5X6 YLW CONV (MISCELLANEOUS) ×3 IMPLANT
POUCH RETRIEVAL ECOSAC 10 (ENDOMECHANICALS) ×2 IMPLANT
POUCH RETRIEVAL ECOSAC 10MM (ENDOMECHANICALS) ×1
SCISSORS LAP 5X35 DISP (ENDOMECHANICALS) ×3 IMPLANT
SET CHOLANGIOGRAPH 5 50 .035 (SET/KITS/TRAYS/PACK) ×1 IMPLANT
SET IRRIG TUBING LAPAROSCOPIC (IRRIGATION / IRRIGATOR) ×3 IMPLANT
SLEEVE ENDOPATH XCEL 5M (ENDOMECHANICALS) ×3 IMPLANT
SPECIMEN JAR SMALL (MISCELLANEOUS) ×3 IMPLANT
SUT MNCRL AB 4-0 PS2 18 (SUTURE) ×3 IMPLANT
SUT VICRYL 0 UR6 27IN ABS (SUTURE) ×2 IMPLANT
TOWEL OR 17X24 6PK STRL BLUE (TOWEL DISPOSABLE) ×3 IMPLANT
TOWEL OR 17X26 10 PK STRL BLUE (TOWEL DISPOSABLE) ×3 IMPLANT
TRAY LAPAROSCOPIC MC (CUSTOM PROCEDURE TRAY) ×3 IMPLANT
TROCAR XCEL BLUNT TIP 100MML (ENDOMECHANICALS) ×3 IMPLANT
TROCAR XCEL NON-BLD 11X100MML (ENDOMECHANICALS) ×3 IMPLANT
TROCAR XCEL NON-BLD 5MMX100MML (ENDOMECHANICALS) ×3 IMPLANT
TUBING INSUFFLATION (TUBING) ×3 IMPLANT
WATER STERILE IRR 1000ML POUR (IV SOLUTION) ×3 IMPLANT

## 2018-01-24 NOTE — Discharge Instructions (Signed)
[facility] ______[facility] °LAPAROSCOPIC SURGERY: POST OP INSTRUCTIONS °Always review your discharge instruction sheet given to you by the facility where your surgery was performed. °IF YOU HAVE DISABILITY OR FAMILY LEAVE FORMS, YOU MUST BRING THEM TO THE OFFICE FOR PROCESSING.   °DO NOT GIVE THEM TO YOUR DOCTOR. ° °1. A prescription for pain medication may be given to you upon discharge.  Take your pain medication as prescribed, if needed.  If narcotic pain medicine is not needed, then you may take acetaminophen (Tylenol) or ibuprofen (Advil) as needed. °2. Take your usually prescribed medications unless otherwise directed. °3. If you need a refill on your pain medication, please contact your pharmacy.  They will contact our office to request authorization. Prescriptions will not be filled after 5pm or on week-ends. °4. You should follow a light diet the first few days after arrival home, such as soup and crackers, etc.  Be sure to include lots of fluids daily. °5. Most patients will experience some swelling and bruising in the area of the incisions.  Ice packs will help.  Swelling and bruising can take several days to resolve.  °6. It is common to experience some constipation if taking pain medication after surgery.  Increasing fluid intake and taking a stool softener (such as Colace) will usually help or prevent this problem from occurring.  A mild laxative (Milk of Magnesia or Miralax) should be taken according to package instructions if there are no bowel movements after 48 hours. °7. Unless discharge instructions indicate otherwise, you may remove your bandages 24-48 hours after surgery, and you may shower at that time.  You may have steri-strips (small skin tapes) in place directly over the incision.  These strips should be left on the skin for 7-10 days.  If your surgeon used skin glue on the incision, you may shower in 24 hours.  The glue will flake off over the next 2-3 weeks.  Any sutures or  staples will be removed at the office during your follow-up visit. °8. ACTIVITIES:  You may resume regular (light) daily activities beginning the next day--such as daily self-care, walking, climbing stairs--gradually increasing activities as tolerated.  You may have sexual intercourse when it is comfortable.  Refrain from any heavy lifting or straining until approved by your doctor. °a. You may drive when you are no longer taking prescription pain medication, you can comfortably wear a seatbelt, and you can safely maneuver your car and apply brakes. °b. RETURN TO WORK:  __________________________________________________________ °9. You should see your doctor in the office for a follow-up appointment approximately 2-3 weeks after your surgery.  Make sure that you call for this appointment within a day or two after you arrive home to insure a convenient appointment time. °10. OTHER INSTRUCTIONS: __________________________________________________________________________________________________________________________ __________________________________________________________________________________________________________________________ °WHEN TO CALL YOUR DOCTOR: °1. Fever over 101.0 °2. Inability to urinate °3. Continued bleeding from incision. °4. Increased pain, redness, or drainage from the incision. °5. Increasing abdominal pain ° °The clinic staff is available to answer your questions during regular business hours.  Please don’t hesitate to call and ask to speak to one of the nurses for clinical concerns.  If you have a medical emergency, go to the nearest emergency room or call 911.  A surgeon from [facility] is always on call at the hospital. °[address], [address], [place], [place]  [address] ? [address] [address], [place],    [address] °[phone] ? [phone] ? FAX [phone] °Web site:   [email or web address] °

## 2018-01-24 NOTE — Transfer of Care (Signed)
Immediate Anesthesia Transfer of Care Note  [name]  Procedure(s) Performed: LAPAROSCOPIC CHOLECYSTECTOMY (N/A Abdomen)  Patient Location: PACU  Anesthesia Type:General  Level of Consciousness: drowsy and patient cooperative  Airway & Oxygen Therapy: Patient Spontanous Breathing and Patient connected to nasal cannula oxygen  Post-op Assessment: Report given to RN and Post -op Vital signs reviewed and stable  Post vital signs: Reviewed and stable  Last Vitals:  Vitals Value Taken Time  BP    Temp    Pulse 75 01/24/2018 11:49 AM  Resp 18 01/24/2018 11:49 AM  SpO2 99 % 01/24/2018 11:49 AM  Vitals shown include unvalidated device data.  Last Pain:  Vitals:   01/24/18 0926  TempSrc:   PainSc: 5       Patients Stated Pain Goal: 3 (26/08/88 3584)  Complications: No apparent anesthesia complications

## 2018-01-24 NOTE — Op Note (Addendum)
Laparoscopic Cholecystectomy  Indications: This patient presents with symptomatic gallbladder disease and will undergo laparoscopic cholecystectomy. The procedure has been discussed with the patient. Operative and non operative treatments have been discussed. Risks of surgery include bleeding, infection,  Common bile duct injury,  Injury to the stomach,liver, colon,small intestine, abdominal wall,  Diaphragm,  Major blood vessels,  And the need for an open procedure.  Other risks include worsening of medical problems, death,  DVT and pulmonary embolism, and cardiovascular events.   Medical options have also been discussed. The patient has been informed of long term expectations of surgery and non surgical options,  The patient agrees to proceed.    Pre-operative Diagnosis: Calculus of gallbladder without mention of cholecystitis or obstruction  Post-operative Diagnosis: Same  Surgeon: Joyice Faster Keenan Dimitrov   Assistants: OR staff  Anesthesia: General endotracheal anesthesia and Local anesthesia 0.25.% bupivacaine, with epinephrine  ASA Class: 3  Procedure Details  The patient was seen again in the Holding Room. The risks, benefits, complications, treatment options, and expected outcomes were discussed with the patient. The possibilities of reaction to medication, pulmonary aspiration, perforation of viscus, bleeding, recurrent infection, finding a normal gallbladder, the need for additional procedures, failure to diagnose a condition, the possible need to convert to an open procedure, and creating a complication requiring transfusion or operation were discussed with the patient. The patient and/or family concurred with the proposed plan, giving informed consent. The site of surgery properly noted/marked. The patient was taken to Operating Room, identified as [name] and the procedure verified as Laparoscopic Cholecystectomy with Intraoperative Cholangiograms. A Time Out was held and the above  information confirmed.  Prior to the induction of general anesthesia, antibiotic prophylaxis was administered. General endotracheal anesthesia was then administered and tolerated well. After the induction, the abdomen was prepped in the usual sterile fashion. The patient was positioned in the supine position with the left arm comfortably tucked, along with some reverse Trendelenburg.  Local anesthetic agent was injected into the skin near the umbilicus and an incision made. The midline fascia was incised and the Hasson technique was used to introduce a 12 mm port under direct vision. It was secured with a figure of eight Vicryl suture placed in the usual fashion. Pneumoperitoneum was then created with CO2 and tolerated well without any adverse changes in the patient's vital signs. Additional trocars were introduced under direct vision with an 11 mm trocar in the epigastrium and two 5 mm trocars in the right upper quadrant. All skin incisions were infiltrated with a local anesthetic agent before making the incision and placing the trocars.   The patient had some midline adhesions taken down with sharp dissection without injury to viscera and good hemostasis.   The gallbladder was identified, the fundus grasped and retracted cephalad. Adhesions were lysed bluntly and with the electrocautery where indicated, taking care not to injure any adjacent organs or viscus. The infundibulum was grasped and retracted laterally, exposing the peritoneum overlying the triangle of Calot. This was then divided and exposed in a blunt fashion. The cystic duct was clearly identified and bluntly dissected circumferentially. The junctions of the gallbladder, cystic duct and common bile duct were clearly identified prior to the division of any linear structure.   The cystic duct was very small and the critical view obtained.  Her liver function and U/S were not supportive of a CBD stone.  IOC was not performed.   The cystic  duct was then  ligated with surgical  clips  on the patient side and  clipped on the gallbladder side and divided. The cystic artery was identified, dissected free, ligated with clips and divided as well. Posterior cystic artery clipped and divided.  The gallbladder was dissected from the liver bed in retrograde fashion with the electrocautery. The gallbladder was removed. The liver bed was irrigated and inspected. Hemostasis was achieved with the electrocautery. Copious irrigation was utilized and was repeatedly aspirated until clear all particulate matter. Hemostasis was achieved with no signs  Of bleeding or bile leakage.  Pneumoperitoneum was completely reduced after viewing removal of the trocars under direct vision. The wound was thoroughly irrigated and the fascia was then closed with a figure of eight suture; the skin was then closed with 4  O   monocryl  and a sterile dressing was applied.  Instrument, sponge, and needle counts were correct at closure and at the conclusion of the case.   Findings:  Cholelithiasis  Estimated Blood Loss: less than 50 mL         Drains: none          Total IV Fluids: per anesthesia record          Specimens: Gallbladder           Complications: None; patient tolerated the procedure well.         Disposition: PACU - hemodynamically stable.         Condition: stable

## 2018-01-24 NOTE — Anesthesia Procedure Notes (Signed)
Procedure Name: Intubation Date/Time: 01/24/2018 10:24 AM Performed by: Bryson Corona, CRNA Pre-anesthesia Checklist: Patient identified, Emergency Drugs available, Suction available and Patient being monitored Patient Re-evaluated:Patient Re-evaluated prior to induction Oxygen Delivery Method: Circle System Utilized Preoxygenation: Pre-oxygenation with 100% oxygen Induction Type: IV induction Ventilation: Mask ventilation without difficulty Laryngoscope Size: Mac and 3 Grade View: Grade I Tube type: Oral Number of attempts: 1 Airway Equipment and Method: Stylet Placement Confirmation: ETT inserted through vocal cords under direct vision,  positive ETCO2 and breath sounds checked- equal and bilateral Secured at: 21 cm Tube secured with: Tape Dental Injury: Teeth and Oropharynx as per pre-operative assessment

## 2018-01-24 NOTE — Anesthesia Preprocedure Evaluation (Signed)
Anesthesia Evaluation  Patient identified by MRN, date of birth, ID band Patient awake    Reviewed: Allergy & Precautions, NPO status , Patient's Chart, lab work & pertinent test results  History of Anesthesia Complications (+) PONV  Airway Mallampati: I       Dental no notable dental hx. (+) Teeth Intact   Pulmonary    Pulmonary exam normal breath sounds clear to auscultation       Cardiovascular hypertension, Pt. on medications + angina Normal cardiovascular exam Rhythm:Regular Rate:Normal     Neuro/Psych    GI/Hepatic GERD  Medicated,  Endo/Other  diabetes, Insulin Dependent, Oral Hypoglycemic AgentsHypothyroidism   Renal/GU      Musculoskeletal   Abdominal (+) + obese,   Peds  Hematology   Anesthesia Other Findings  encounter: Pre-Admission Testing 58 from 01/19/2018 in [facility]READMISSION TEST[facility]       Signed                Anesthesia Chart Review:          Case:  [id number]   Date/Time:  01/24/18 1015        Procedure:  LAPAROSCOPIC CHOLECYSTECTOMY WITH INTRAOPERATIVE CHOLANGIOGRAM (N/A )        Anesthesia type:  General        Pre-op diagnosis:  GALLSTONES        Location:  [facility] OR ROOM 09 / [facility]        Surgeon:  Erroll Luna, MD                DISCUSSION:     - Pt is a 68 year old female with hx cardiomyopathy (2007; resolved by 2009; no cardiomyopathy on echo 03/05/17), HTN, DM, new hyperparathyroidism.      - Has initial appointment with endocrinology 02/27/18 for uncontrolled DM and hyperparathyroidism.      - I notified Hadelyne in Dr. Josetta Huddle office of pt's uncontrolled DM (a1c 10.4)        VS: BP (!) 143/65   Pulse 73   Temp 36.9 C   Resp 18   Ht '5\' 4"'  (1.626 m)   Wt 92.3 kg   SpO2 97%   BMI 34.93 kg/m         PROVIDERS:  - PCP is Susy Frizzle, MD  - Used to see cardiologist Allegra Lai, MD. Last office visit 04/26/17.  PRN f/u recommended.         LABS:   - HbA1c 10.4, glucose 189. Pt has been referred to endocrinology for uncontrolled DM.      - Calcium 10.8 is consistent with recent prior results.  Per Dr. Josetta Huddle H&P, pt has been referred to endocrinology for hyperparathyroid work up.          (all labs ordered are listed, but only abnormal results are displayed)           Labs Reviewed    GLUCOSE, CAPILLARY - Abnormal; Notable for the following components:        Result   Value        Glucose-Capillary   189 (*)            All other components within normal limits    HEMOGLOBIN A1C - Abnormal; Notable for the following components:        Hgb A1c MFr Bld   10.4 (*)            All other components within normal limits  COMPREHENSIVE METABOLIC PANEL - Abnormal; Notable for the following components:        Glucose, Bld   197 (*)            Calcium   10.8 (*)            All other components within normal limits    CBC WITH DIFFERENTIAL/PLATELET             IMAGES:     CXR 12/25/17: No active cardiopulmonary disease.        EKG 12/25/17: NSR. Cannot rule out Anterior infarct, age undetermined. Appears stable compared with prior EKG 08/16/17.         CV:     Echo 03/05/17:   - Left ventricle: The cavity size was normal. Wall thickness was increased in a pattern of mild LVH. Systolic function was normal. The estimated ejection fraction was in the range of 55% to 60%. Wall motion was normal; there were no regional wall motion abnormalities.  - Aortic valve: There was trivial regurgitation.  - Mitral valve: Calcified annulus. Mildly thickened leaflets .  - Left atrium: The atrium was mildly dilated.  - Atrial septum: No defect or patent foramen ovale was identified.     Nuclear stress test 10/29/15:   .Nuclear stress EF: 57%.   .There was no ST segment  deviation noted during stress.   .Defect 1: There is a medium defect of moderate severity present in the mid anterolateral and apex location.   .Defect 2: There is a medium defect of moderate severity present in the basal inferolateral location.   .This is a low risk study.   .The left ventricular ejection fraction is normal (55-65%).   - Low risk stress nuclear study with probable breast attenuation and thinning of the basal inferior lateral wall; no ischemia; EF 57 with normal wall motion.             Past Medical History:    Diagnosis   Date    .   Anginal pain ([place])   03/19/2012        saw Dr. Cathie Olden .. she thinks its more related to stomach issues    .   Arthritis            "all over; mostly in my back" (03/20/2012)    .   Breast cancer ([place])            "right" (03/20/2012)    .   Cardiomyopathy        .   Chest pain   06/2005        Hospitalized, dystolic dysfunction    .   Chronic lower back pain            "have 2 herniated discs; going to have to have a fusion" (03/20/2012)    .   Family history of anesthesia complication            "father has nausea and vomiting"    .   Gallstones        .   GERD (gastroesophageal reflux disease)        .   H/O hiatal hernia        .   History of kidney stones        .   History of right mastectomy        .   Hypertension            "patient states never had  HTN, takes Hyzaar for heart    .   Hypothyroidism        .   Irritable bowel syndrome        .   Migraines            "it's been a long time since I've had one" (03/20/2012)    .   Osteoarthritis        .   Peripheral neuropathy        .   PONV (postoperative nausea and vomiting)        .   Sciatica        .   Type II diabetes mellitus ([place])                    Past Surgical History:    Procedure    Laterality   Date    .   ABDOMINAL HYSTERECTOMY       1993    .   adenosin cardiolite       07/2005        (+) wall motion abnormality    .   AXILLARY LYMPH NODE DISSECTION       2003        squamous cell cancer    .   BACK SURGERY            .   CARDIAC CATHETERIZATION       ? 2005 / 2007    .   CT abdomen and pelvis       02/2010        Same    .   ESOPHAGOGASTRODUODENOSCOPY       2005        GERD    .   KNEE ARTHROSCOPY       1990's        "right" (03/20/2012)    .   LUMBAR FUSION   N/A   08/22/2014        Procedure: Right L2-3 and right L1-2 transforaminal lumbar interbody fusion with pedicle screws, rods, sleeves, cages, local bone graft, Vivigen, cancellous chips;  Surgeon: Jessy Oto, MD;  Location: [facility];  Service: Orthopedics;  Laterality: N/A;    .   LUMBAR LAMINECTOMY/DECOMPRESSION MICRODISCECTOMY   N/A   02/04/2014        Procedure: RIGHT L2-3 MICRODISCECTOMY ;  Surgeon: Jessy Oto, MD;  Location: [facility];  Service: Orthopedics;  Laterality: N/A;    .   MASTECTOMY WITH AXILLARY LYMPH NODE DISSECTION       12/2003        "right" (03/20/2012)    .   POSTERIOR CERVICAL FUSION/FORAMINOTOMY       2001    .   SHOULDER ARTHROSCOPY W/ ROTATOR CUFF REPAIR       2003        Left    .   THYROIDECTOMY       1977        Right    .   TOTAL KNEE ARTHROPLASTY       2000        Right    .   [place] of abdomen       07/2005        Fatty liver / kidney stones          MEDICATIONS:   .   acetaminophen (TYLENOL) 500 MG tablet    .   amLODipine (NORVASC) 10 MG  tablet    .   aspirin 81 MG tablet    .   Blood Glucose Calibration (TRUE METRIX LEVEL 2) Normal SOLN    .   Blood Glucose Monitoring Suppl (TRUE METRIX AIR GLUCOSE METER) w/Device KIT    .   esomeprazole (NEXIUM) 40 MG capsule     .   fluticasone (FLONASE) 50 MCG/ACT nasal spray    .   gabapentin (NEURONTIN) 300 MG capsule    .   glucose blood (TRUE METRIX BLOOD GLUCOSE TEST) test strip    .   HYDROcodone-acetaminophen (NORCO/VICODIN) 5-325 MG tablet    .   insulin NPH-regular Human (NOVOLIN 70/30 RELION) (70-30) 100 UNIT/ML injection    .   levothyroxine (SYNTHROID, LEVOTHROID) 100 MCG tablet    .   lidocaine (XYLOCAINE) 2 % solution    .   losartan (COZAAR) 50 MG tablet    .   metFORMIN (GLUCOPHAGE) 850 MG tablet    .   polyethylene glycol (MIRALAX / GLYCOLAX) packet    .   TRUEPLUS LANCETS 33G MISC        No current facility-administered medications for this encounter.           If glucose acceptable day of surgery, I anticipate pt can proceed with surgery as scheduled.     Willeen Cass, FNP-BC  [facility]/Anesthesiology  Phone: [phone]  01/22/2018 11:41 AM                                    Sheria Lang, RN Registered Nurse Progress Notes Addendum 01/18/2018 12:01 PM    Edit Note     Reproductive/Obstetrics                             Anesthesia Physical Anesthesia Plan  ASA: III  Anesthesia Plan: General   Post-op Pain Management:    Induction: Intravenous  PONV Risk Score and Plan: 4 or greater and Ondansetron, Dexamethasone and Treatment may vary due to age or medical condition  Airway Management Planned: Oral ETT  Additional Equipment:   Intra-op Plan:   Post-operative Plan: Extubation in OR  Informed Consent: I have reviewed the patients History and Physical, chart, labs and discussed the procedure including the risks, benefits and alternatives for the proposed anesthesia with the patient or authorized representative who has indicated his/her understanding and acceptance.   Dental advisory given  Plan Discussed with: CRNA and Surgeon  Anesthesia  Plan Comments:         Anesthesia Quick Evaluation

## 2018-01-24 NOTE — Interval H&P Note (Signed)
History and Physical Interval Note:  01/24/2018 10:03 AM  [name]  has presented today for surgery, with the diagnosis of GALLSTONES  The various methods of treatment have been discussed with the patient and family. After consideration of risks, benefits and other options for treatment, the patient has consented to  Procedure(s): LAPAROSCOPIC CHOLECYSTECTOMY WITH INTRAOPERATIVE CHOLANGIOGRAM (N/A) as a surgical intervention .  The patient's history has been reviewed, patient examined, no change in status, stable for surgery.  I have reviewed the patient's chart and labs.  Questions were answered to the patient's satisfaction.     [place]

## 2018-01-25 ENCOUNTER — Encounter (HOSPITAL_COMMUNITY): Payer: Self-pay | Admitting: Surgery

## 2018-01-28 IMAGING — MR MR HEAD W/O CM
8 series · 45 of 48 positions shown · non-contrast
Comparison: None.

CLINICAL DATA: Dizziness and gait disturbance over several weeks
duration. Personal history of breast cancer. Technologist unable to
gain venous access.

EXAM:
MRI HEAD WITHOUT CONTRAST
TECHNIQUE: Multiplanar, multiecho pulse sequences of the brain and surrounding
structures were obtained without intravenous contrast.

[Series 2: T1 · sagittal · 5.0mm · 0.45mm/px · 2 of 21 slices shown]
[im 1/21]
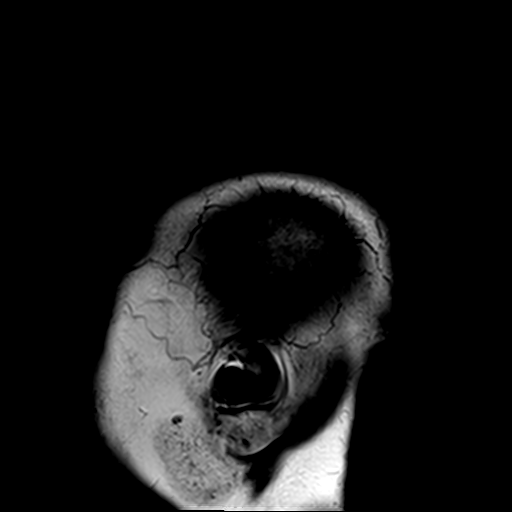
[im 21/21]
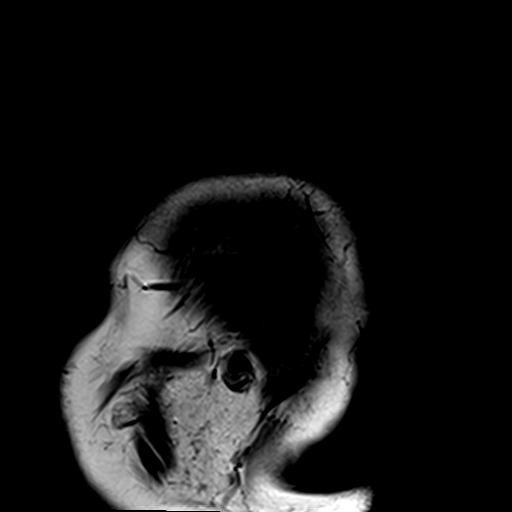

[Series 3: DWI · axial · 3.0mm · 1.80mm/px · z∈[-60,+86]mm · 12 of 100 slices shown (1 of 2)]
[im 1/100]
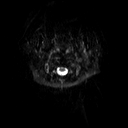
[im 10/100]
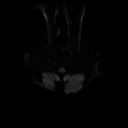
[im 19/100]
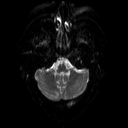
[im 28/100]
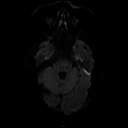
[im 37/100]
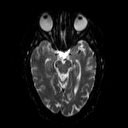
[im 46/100]
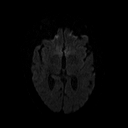
[im 55/100]
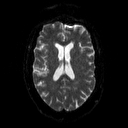
[im 64/100]
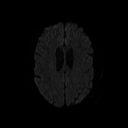
[im 73/100]
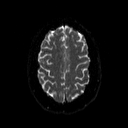
[im 82/100]
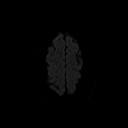
[im 91/100]
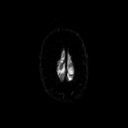
[im 100/100]
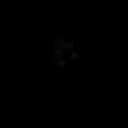

[Series 4: DWI · axial · 3.0mm · 1.80mm/px · z∈[-60,+86]mm · 6 of 50 slices shown (2 of 2)]
[im 1/50]
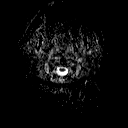
[im 10/50]
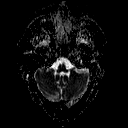
[im 20/50]
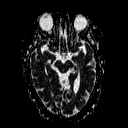
[im 30/50]
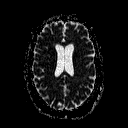
[im 40/50]
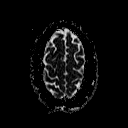
[im 50/50]
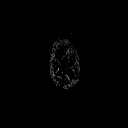

[Series 5: T2 · axial · 5.0mm · 0.51mm/px · z∈[-59,+83]mm · 3 of 22 slices shown (1 of 2)]
[im 1/22]
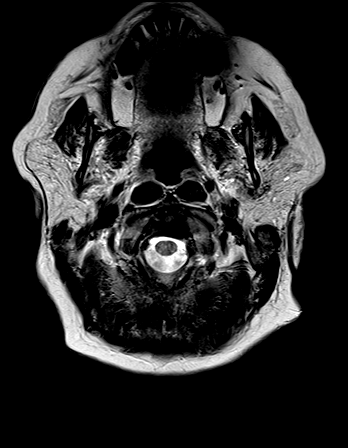
[im 11/22]
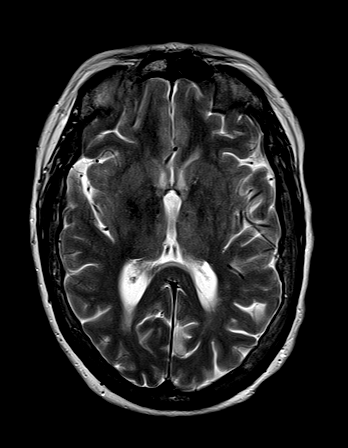
[im 22/22]
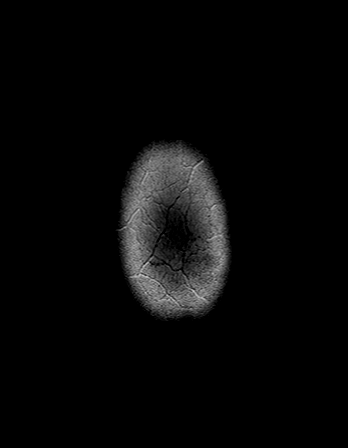

[Series 6: FLAIR · axial · 5.0mm · 0.45mm/px · z∈[-58,+84]mm · 3 of 22 slices shown]
[im 1/22]
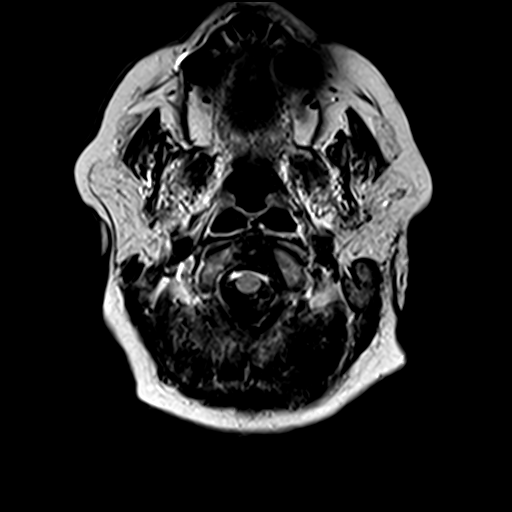
[im 11/22]
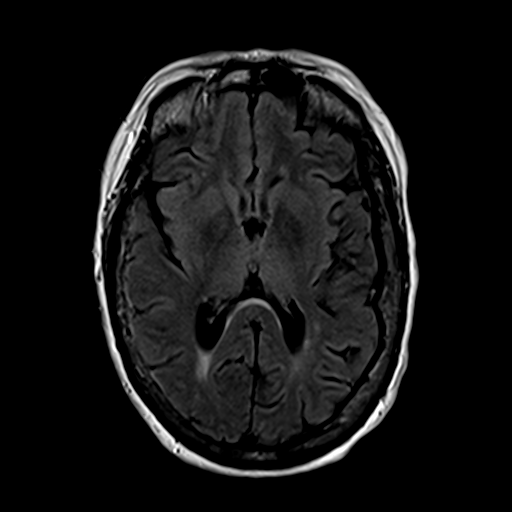
[im 22/22]
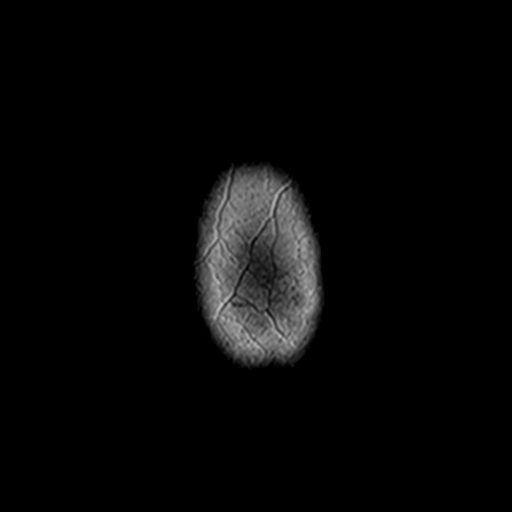

[Series 7: t1_mpr_tra · axial · 2.0mm · 0.45mm/px · z∈[-59,+83]mm · 8 of 72 slices shown]
[im 1/72]
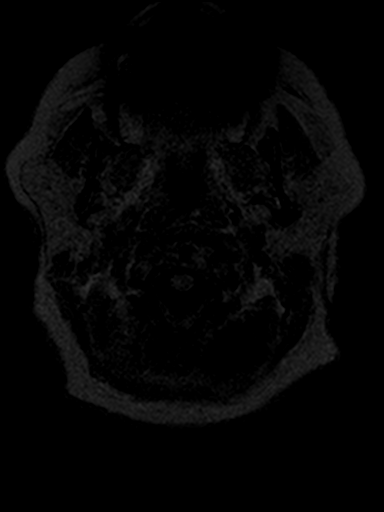
[im 9/72]
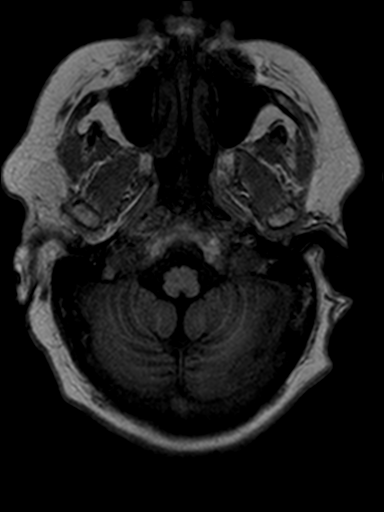
[im 18/72]
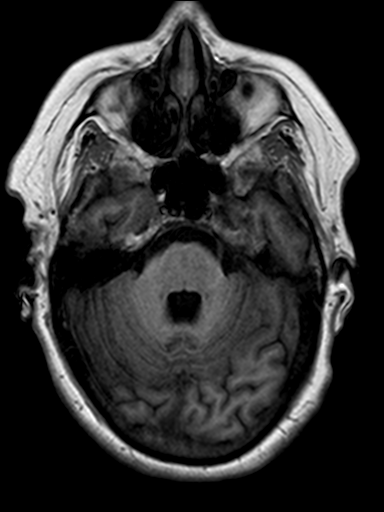
[im 27/72]
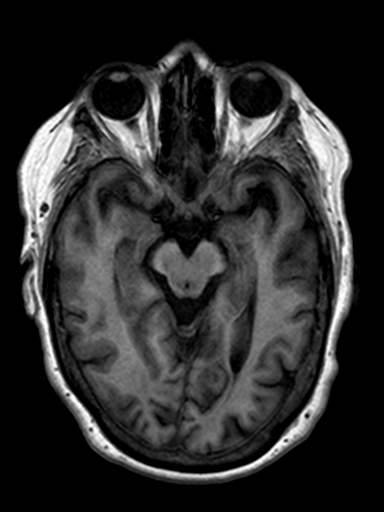
[im 45/72]
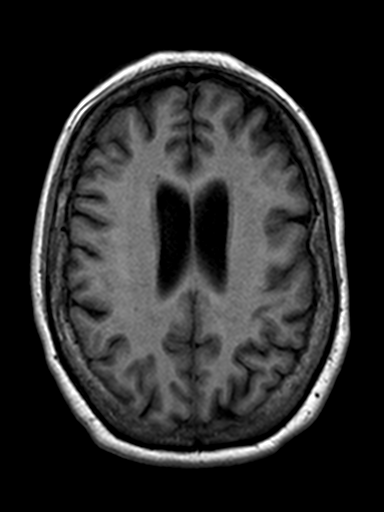
[im 54/72]
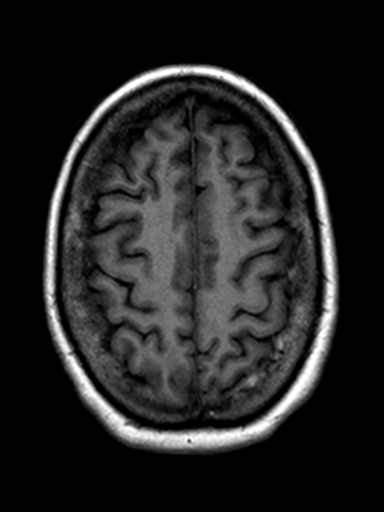
[im 63/72]
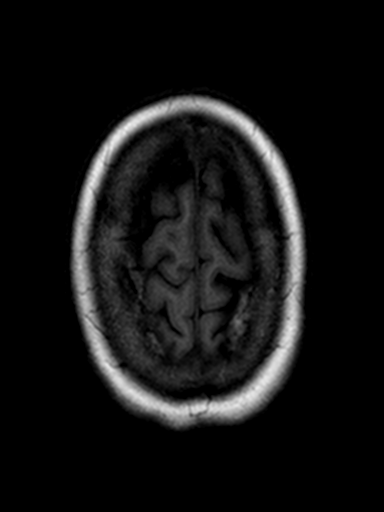
[im 72/72]
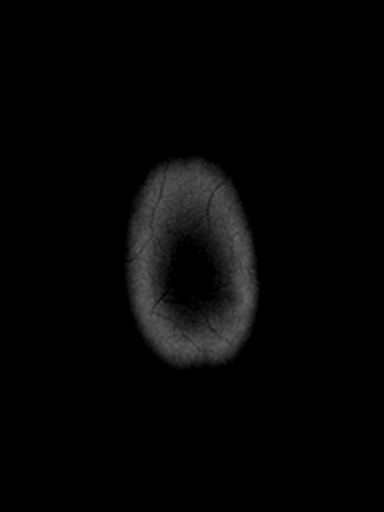

[Series 8: T2 · coronal · 5.0mm · 0.45mm/px · 3 of 25 slices shown (2 of 2)]
[im 1/25]
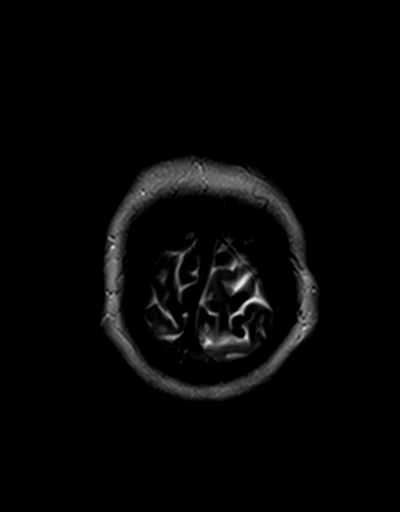
[im 13/25]
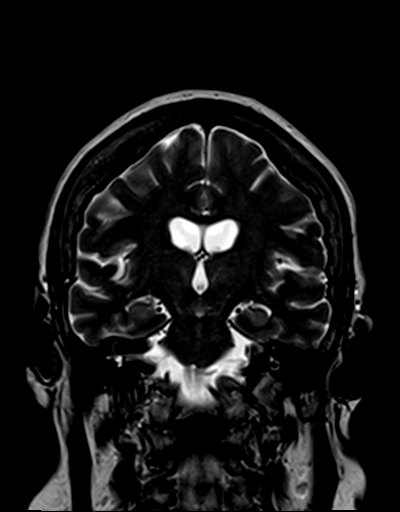
[im 25/25]
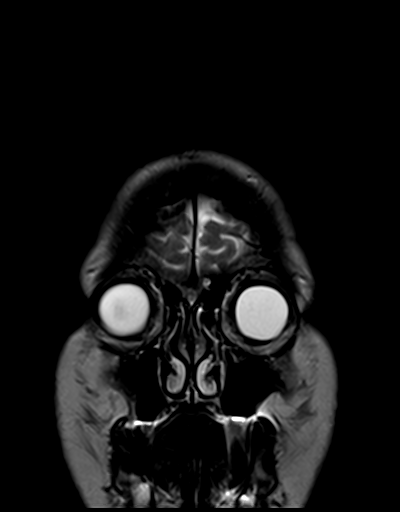

[Series 10: swi_images · axial · 2.0mm · 0.90mm/px · z∈[-66,+92]mm · 8 of 80 slices shown]
[im 1/80]
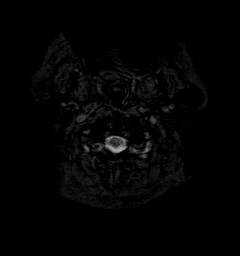
[im 9/80]
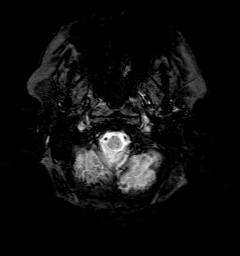
[im 27/80]
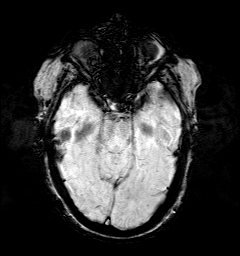
[im 36/80]
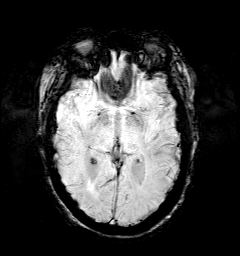
[im 44/80]
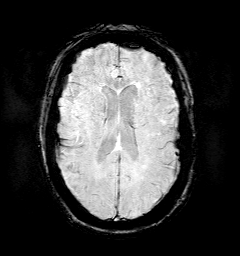
[im 53/80]
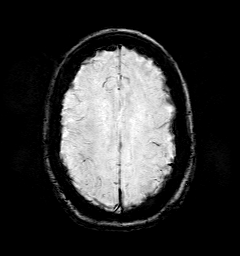
[im 71/80]
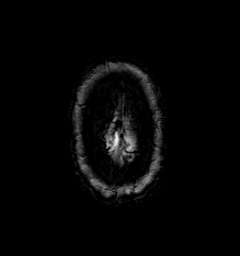
[im 80/80]
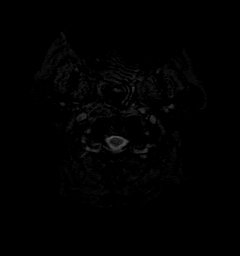

[45 of 48 positions shown; findings below may reference images not displayed]

FINDINGS: Diffusion imaging does not show any acute or subacute infarction.
There chronic small-vessel ischemic changes affecting the pons. No
focal cerebellar abnormality. Cerebral hemispheres show mild chronic
small-vessel disease of the deep white matter. No cortical or large
vessel territory infarction. No mass lesion, hemorrhage,
hydrocephalus or extra-axial collection. No pituitary mass. No fluid
in the sinuses. There may be a small mastoid effusion on the left.
Major vessels at the base of the brain show flow.
IMPRESSION: No acute or reversible finding. No specific cause of the presenting
symptoms is identified. Mild chronic small-vessel disease of the
pons in the cerebral hemispheric white matter. Small mastoid
effusion on the left.

## 2018-01-30 NOTE — Anesthesia Postprocedure Evaluation (Signed)
Anesthesia Post Note  [name]  Procedure(s) Performed: LAPAROSCOPIC CHOLECYSTECTOMY (N/A Abdomen)     Patient location during evaluation: PACU Anesthesia Type: General Level of consciousness: awake Pain management: pain level controlled Vital Signs Assessment: post-procedure vital signs reviewed and stable Respiratory status: spontaneous breathing Cardiovascular status: stable Postop Assessment: no apparent nausea or vomiting Anesthetic complications: no    Last Vitals:  Vitals:   01/24/18 1330 01/24/18 1348  BP: (!) 107/59 (!) 149/76  Pulse: 63 79  Resp: 17 18  Temp:    SpO2: 94% 94%    Last Pain:  Vitals:   01/24/18 1348  TempSrc:   PainSc: 0-No pain   Pain Goal: Patients Stated Pain Goal: 3 (01/24/18 0926)               Huston Foley

## 2018-02-05 ENCOUNTER — Other Ambulatory Visit: Payer: Self-pay

## 2018-02-05 ENCOUNTER — Ambulatory Visit (INDEPENDENT_AMBULATORY_CARE_PROVIDER_SITE_OTHER): Payer: Medicare HMO | Admitting: Family Medicine

## 2018-02-05 ENCOUNTER — Encounter: Payer: Self-pay | Admitting: Family Medicine

## 2018-02-05 VITALS — BP 142/76 | HR 76 | Temp 98.6°F | Resp 14 | Ht 65.0 in | Wt 204.0 lb

## 2018-02-05 DIAGNOSIS — I10 Essential (primary) hypertension: Secondary | ICD-10-CM

## 2018-02-05 DIAGNOSIS — R5382 Chronic fatigue, unspecified: Secondary | ICD-10-CM | POA: Diagnosis not present

## 2018-02-05 DIAGNOSIS — E213 Hyperparathyroidism, unspecified: Secondary | ICD-10-CM

## 2018-02-05 DIAGNOSIS — E1165 Type 2 diabetes mellitus with hyperglycemia: Secondary | ICD-10-CM | POA: Diagnosis not present

## 2018-02-05 DIAGNOSIS — R9430 Abnormal result of cardiovascular function study, unspecified: Secondary | ICD-10-CM | POA: Diagnosis not present

## 2018-02-05 DIAGNOSIS — R0789 Other chest pain: Secondary | ICD-10-CM | POA: Diagnosis not present

## 2018-02-05 DIAGNOSIS — E118 Type 2 diabetes mellitus with unspecified complications: Secondary | ICD-10-CM | POA: Diagnosis not present

## 2018-02-05 DIAGNOSIS — IMO0002 Reserved for concepts with insufficient information to code with codable children: Secondary | ICD-10-CM

## 2018-02-05 MED ORDER — GABAPENTIN 300 MG PO CAPS: 300.0000 mg | ORAL_CAPSULE | Freq: Three times a day (TID) | ORAL | 3 refills | Status: DC

## 2018-02-05 MED ORDER — MOMETASONE FUROATE 0.1 % EX CREA: 1.0000 "application " | TOPICAL_CREAM | Freq: Every day | CUTANEOUS | 0 refills | Status: DC

## 2018-02-05 NOTE — Progress Notes (Signed)
 Subjective:    Patient ID: [name], female    DOB: 09/24/1950, 67 y.o.   MRN: [id number]  HPI  05/18/17 Patient called 1/29 and stated she could no longer afford toujeo 80 units a day and wanted to switch to generic 70/30.  I recommended 45 units in the am and 25 units in the pm.  Here today to titrate insulin dose.  After her last visit, I asked the patient to resume hydrochlorothiazide which she has stopped.  She did resume the hydrochlorothiazide but then discontinued losartan for reasons I am uncertain of.  As a result her blood pressure is elevated today.  She denies any chest pain shortness of breath or dyspnea on exertion.  She is taking her insulin as prescribed however she skips meals frequently.  She is only checked for sugar values.  Each of these readings were in the morning before she ate breakfast.  They are as follows: 260, 210, 170, 160.  At that time, my plan was:  Patient is instructed to continue hydrochlorothiazide 12.5 mg a day.  However she is instructed to resume losartan 50 mg a day.  I would like to recheck her blood pressure next week.  I have recommended that she take her insulin as prescribed 45 units in the morning and 25 unit.  I want her to check her blood sugar before every meal breakfast, lunch, dinner, and record the values that I can see the sugar readings in 1 week when.  For random sugars are not sufficient to make safe accurate adjustments in her insulin dose.  I also explained her the dangers and risk of hypoglycemia if she does not eat regularly.  I encouraged the patient to eat 3 regular meals per day to be compliant with her insulin.  Over the last few visits, the patient seems increasingly more confused.  I am concerned that she may be showing signs of possible dementia.  I will perform a Mini-Mental status exam at her next visit.  05/25/17 Patient presents today with sugar readings just as I have asked her to do.  Her blood sugars in the morning are  averaging between 140 and 170.  Her blood sugars at lunchtime are averaging between 170 and 200.  Her blood sugars in the evening are 200-250.  She is taking 45 units in the morning and 25 units in the evening after dinner.  Her blood pressure today is no better than it was last time.  Patient has still not started taking her blood pressure medication.  She states that she forgets to take the pill.  I performed a Mini-Mental status exam today which she scored 30 out of 30 on.  I was quite surprised by her performance on the Mini-Mental status exam as I was concerned that she may have some underlying dementia.  I do still feel that she has some underlying memory problems.  At the present time, she is administering her own medication.  Occasionally her husband will ask her if she is taken it but he is not directly supervising the medication.  At that time, my plan was: Spent 25 minutes today with the patient and her husband.  Despite her performance on the Mini-Mental status exam which she scored 30 out of 30 I feel that she has some mild short-term memory loss.  I suspect that given her medical history she is likely suffered some small strokes and may have some underlying vascular dementia.  Therefore I want to   work hard to reduce her risk factors.  Increase her insulin to 55 units in the morning.  Increase the evening dose to 30 units before supper.  Recheck 3 times daily sugars in 1 week.  Also gave strategies to help her remember to take her medication for her blood pressure and also recommended strongly that her husband administer the medicine to her or at least supervise her take her medication because of the memory problems    06/01/17 Blood pressure is much better now that she is taking both of her medications.  Blood pressure today is 126/78.  I continue to encourage compliance.  She is currently on 55 units of 70/30 in the morning and 30 units of 70/30 in the afternoon.  Fasting blood sugars are 130-100  and.  Lunchtime sugars are 200-250.  Dinner sugars are 200-250.  There is no episodes of hypoglycemia.  At that time, my plan was: Increase insulin 70/30-65 units in the morning to better manage her lunch and suppertime sugars.  Continue 30 units in the evening to avoid nocturnal hypoglycemia.  Recheck sugars in 1 week to make further titrations in insulin dosage.  Blood pressure is now well controlled.  No further change in medication.  Continue to encourage compliance  01/01/18 Patient was recently in the hospital with substernal chest pain/epigastric chest pain.  Cardiac enzymes were negative x2.  Patient had a CT scan of the abdomen and pelvis that showed small gallstones and layering sludge within the gallbladder.  Right upper quadrant ultrasound also confirmed gallstones but no evidence of cholecystitis.  She was discharged home on Nexium for acid reflux along with viscous lidocaine and was instructed to schedule a follow-up with general surgery.  It is important to note that the patient was seen by cardiology in November 2018 and had a Lexiscan Myoview performed which revealed no evidence of reversible ischemia and was similar to her previous stress test from 2017.  Cardiology at that time recommended no further cardiac work-up.  The patient's history today is very confusing.  She states that she is having substernal chest pain that is triggered by activity and it improves with rest.  Symptoms do not sound like gallstones.  She is already scheduled appointment to see the general surgeon.  She also reports nausea.  She denies any vomiting.  She denies any shortness of breath.  She denies any left arm pain.  She has no right upper quadrant tenderness.  She denies any association of the pain with food.  However viscous lidocaine did seem to help the pain when she was took it in the emergency room.  She was also found to have elevated calcium levels.  I reviewed the patient's medication list with her.  It  is very confusing.  Patient has not been taking her blood pressure medication at all.  She has not been taking hydrochlorothiazide.  She has not been taking losartan.  She is also usually not taking her insulin.  When she does take insulin she takes 65 units in the morning and 30 units in the evening.  She frequently forgets to take her medication.  I do not believe that this is noncompliance however I am concerned about dementia.  Patient had an MRI of the brain performed in May which showed chronic small vessel ischemic changes consistent with possible underlying vascular dementia.  Patient is administering her own medication and based on our conversation today, she cannot recall any of the names of the medicine she   is taking and is not certain of whether or not she is taking them although she is adamant that she has not taken her blood pressure medication in more than a week.  She is also not checking her sugars.  At that time, my plan was: Pressure is now much better at 142/70.  The patient has an appointment today to see the surgeon to discuss her gallstones.  She continues to have atypical substernal chest pain now unrelated to activity with no shortness of breath.  I am not convinced that this is her gallbladder.  If the surgeon agrees, I would recommend with a GI consultation for possible EGD.  However I would appreciate the patient seeing the surgeon today to discuss her hyperparathyroidism.  PTH was greater than 70 despite her elevated calcium level.  I have recommended discontinuation of calcium and hydrochlorothiazide and then repeat a BMP in 1 month.  If calcium levels remain elevated, she may require surgical excision of her parathyroid glands.  Recheck the patient in 1 month.  Thankfully blood pressure is much better today   02/05/18 Several issues today to be discussed. 1) hyperparathyroidism.  PTH was greater than 70 despite hypercalcemia.  She is here today to recheck her calcium level.  I  have scheduled her an appointment to meet with an endocrinologist, Dr. Nida to discuss treatment options for hyperparathyroidism.  She has met with a general surgeon and he removed her gallbladder since I last saw the patient.  He deferred management of her hyperparathyroidism until after the surgery and recommended an endocrinology consult.  2) hypertension.  Patient is now compliant with her blood pressure medication.  Her husband is administering her medicine.  Her blood pressure today is more appropriate 142/76  3) diabetes.  Patient has not been checking her sugars or taking her insulin appropriately.  Her last hemoglobin A1c in October was greater than 10.  This indicates to me that her blood sugars have been averaging in the 250s to 300s which is not with the patient is reporting to me.  I believe this is likely due to to noncompliance however I have recommended an endocrinologist begin managing her diabetes to try to affect better control as I have been unsuccessful in counseling and motivating the patient to control her diabetes.  She has an appointment to see Dr. Nida in November  4) atypical chest pain.  Please see the discussion in the office visit from September 2019.  Cardiology did not believe her chest pain was cardiac in nature and have released her from further work-up.  The patient's gallbladder was recently removed.  However she continues to report substernal chest pain.  She states that she is fine in the morning.  However when she stands up, the pain begins.  If she lies down the pain goes away.  It radiates from her sternum into her back.  She is taking Nexium although she seems confused when I asked her about this.  She does report a morning cough.  She denies any reflux.  I have recommended a GI consultation and EGD  5) patient continues to report chronic fatigue.  She is in denial that her elevated blood sugars and uncontrolled hypertension could be causing her fatigue.  On her  stress test from last year, her ejection fraction was 47%.  I question if her ventricular function may have declined further due to her poorly controlled diabetes and her poorly controlled hypertension.  She does report shortness of breath with   activity.  A CBC recently ruled out anemia.  No one has checked her thyroid in over a year. Current Outpatient Medications on File Prior to Visit  Medication Sig Dispense Refill  . acetaminophen (TYLENOL) 500 MG tablet Take 1,000 mg by mouth every 6 (six) hours as needed for moderate pain or headache.    [name] Kitchen amLODipine (NORVASC) 10 MG tablet Take 1 tablet (10 mg total) by mouth daily. 90 tablet 3  . aspirin 81 MG tablet Take 81 mg by mouth daily.    . Blood Glucose Calibration (TRUE METRIX LEVEL 2) Normal SOLN Use as directed 3 each 3  . Blood Glucose Monitoring Suppl (TRUE METRIX AIR GLUCOSE METER) w/Device KIT 1 kit by Does not apply route 3 (three) times daily. 1 kit 0  . esomeprazole (NEXIUM) 40 MG capsule Take 1 capsule (40 mg total) by mouth daily. 30 capsule 0  . fluticasone (FLONASE) 50 MCG/ACT nasal spray Place 2 sprays into both nostrils daily. (Patient taking differently: Place 2 sprays into both nostrils daily as needed for allergies. ) 48 g 3  . gabapentin (NEURONTIN) 300 MG capsule Take 1 capsule (300 mg total) by mouth 3 (three) times daily. 90 capsule 3  . glucose blood (TRUE METRIX BLOOD GLUCOSE TEST) test strip Use as instructed 100 each 12  . ibuprofen (ADVIL,MOTRIN) 800 MG tablet Take 1 tablet (800 mg total) by mouth every 8 (eight) hours as needed. 30 tablet 0  . insulin NPH-regular Human (NOVOLIN 70/30 RELION) (70-30) 100 UNIT/ML injection 45 u qam & 25 u qpm (Patient taking differently: Inject 30-65 Units into the skin See admin instructions. Inject 65 units SQ in the morning and inject 30 units SQ in the evening) 10 mL 11  . levothyroxine (SYNTHROID, LEVOTHROID) 100 MCG tablet Take 1 tablet (100 mcg total) by mouth daily. 90 tablet 3  .  lidocaine (XYLOCAINE) 2 % solution Use as directed 15 mLs in the mouth or throat every 6 (six) hours as needed for mouth pain. 100 mL 0  . losartan (COZAAR) 50 MG tablet Take 1 tablet (50 mg total) by mouth daily. 90 tablet 3  . metFORMIN (GLUCOPHAGE) 850 MG tablet Take 1 tablet (850 mg total) by mouth 2 (two) times daily with a meal. 180 tablet 3  . OMEPRAZOLE PO     . oxyCODONE (OXY IR/ROXICODONE) 5 MG immediate release tablet Take 1 tablet (5 mg total) by mouth every 6 (six) hours as needed for severe pain. 10 tablet 0  . polyethylene glycol (MIRALAX / GLYCOLAX) packet Take 17 g by mouth daily as needed for moderate constipation.    . TRUEPLUS LANCETS 33G MISC Use as directed 300 each 3   No current facility-administered medications on file prior to visit.     Allergies  Allergen Reactions  . Dilaudid [Hydromorphone Hcl] Nausea And Vomiting  . Lipitor [Atorvastatin] Other (See Comments)    Body & Muscle Aches  . Adhesive [Tape] Rash  . Ampicillin Rash and Other (See Comments)    REACTION: Rash and welts on her hands when she took it in the hospital  . Latex Itching, Dermatitis, Rash and Other (See Comments)    REACTION: Patient had a reaction to tape after surgery and they told her she was allergic to Latex 03/20/2012 "if it's on too long it will break me out"  . Penicillins Rash and Other (See Comments)    Has patient had a PCN reaction causing immediate rash, facial/tongue/throat swelling, SOB or lightheadedness  with hypotension: No Has patient had a PCN reaction causing severe rash involving mucus membranes or skin necrosis: No Has patient had a PCN reaction that required hospitalization: Yes - in hospital receiving treatment Has patient had a PCN reaction occurring within the last 10 years: No If all of the above answers are "NO", then may proceed with Cephalosporin use.    Social History   Socioeconomic History  . Marital status: Married    Spouse name: Not on file  .  Number of children: 1  . Years of education: Not on file  . Highest education level: Not on file  Occupational History  . Occupation: Retired since 2003  Social Needs  . Financial resource strain: Not on file  . Food insecurity:    Worry: Not on file    Inability: Not on file  . Transportation needs:    Medical: Not on file    Non-medical: Not on file  Tobacco Use  . Smoking status: Never Smoker  . Smokeless tobacco: Never Used  Substance and Sexual Activity  . Alcohol use: No  . Drug use: No  . Sexual activity: Not on file  Lifestyle  . Physical activity:    Days per week: Not on file    Minutes per session: Not on file  . Stress: Not on file  Relationships  . Social connections:    Talks on phone: Not on file    Gets together: Not on file    Attends religious service: Not on file    Active member of club or organization: Not on file    Attends meetings of clubs or organizations: Not on file    Relationship status: Not on file  . Intimate partner violence:    Fear of current or ex partner: Not on file    Emotionally abused: Not on file    Physically abused: Not on file    Forced sexual activity: Not on file  Other Topics Concern  . Not on file  Social History Narrative  . Not on file   Review of Systems  All other systems reviewed and are negative.    Review of Systems  All other systems reviewed and are negative.      Objective:   Physical Exam  Constitutional: She is oriented to person, place, and time. She appears well-developed and well-nourished. No distress.  Neck: Neck supple. No JVD present. No thyromegaly present.  Cardiovascular: Normal rate, regular rhythm and normal heart sounds. Exam reveals no gallop and no friction rub.  No murmur heard. Pulmonary/Chest: Effort normal and breath sounds normal. No respiratory distress. She has no wheezes. She has no rales. She exhibits no tenderness.  Abdominal: Soft. Bowel sounds are normal. She exhibits no  distension and no mass. There is no tenderness. There is no rebound and no guarding.  Lymphadenopathy:    She has no cervical adenopathy.  Neurological: She is alert and oriented to person, place, and time.  Skin: She is not diaphoretic.  Vitals reviewed.         Assessment & Plan:  Hyperparathyroidism ([place]) - Plan: COMPLETE METABOLIC PANEL WITH GFR, Ambulatory referral to Endocrinology  Hypercalcemia  Essential hypertension  Diabetes mellitus type 2, uncontrolled, with complications ([facility])  Chronic fatigue - Plan: TSH, ECHOCARDIOGRAM COMPLETE  Atypical chest pain - Plan: ECHOCARDIOGRAM COMPLETE, Ambulatory referral to Gastroenterology  Ejection fraction < 50% - Plan: ECHOCARDIOGRAM COMPLETE  With regards to her hyperparathyroidism, I will check a MP today to monitor  her calcium level.  She has a follow-up appointment scheduled with her endocrinologist.  I believe she will likely require surgery to remove the parathyroid glands but I will defer management to endocrinology.  Regarding her diabetes, I have been unsuccessful in managing her blood sugars.  I recommended a consultation with endocrinology to try to affect better control.  Her blood pressure is now acceptable now that she is compliant with her medication.  I will make no changes at this time.  Regarding her fatigue, I will check her TSH to evaluate for hypothyroidism.  I will repeat an echocardiogram of the heart to ensure that her left ventricular function has not declined further given her poorly controlled diabetes and hypertension due to medical noncompliance.  If her EF has declined further, she will likely need follow-up with cardiology and we will need to maximize her medical therapy for left ventricular dysfunction.  However I believe her fatigue is due to medical noncompliance and her uncontrolled blood pressure and her uncontrolled diabetes.  Regarding her atypical chest pain, Myoview was normal, CT scan revealed no  cause, gallbladder has been removed.  Therefore I will consult GI for an EGD

## 2018-02-06 ENCOUNTER — Other Ambulatory Visit: Payer: Self-pay | Admitting: Family Medicine

## 2018-02-06 DIAGNOSIS — Z79899 Other long term (current) drug therapy: Secondary | ICD-10-CM

## 2018-02-06 DIAGNOSIS — R5382 Chronic fatigue, unspecified: Secondary | ICD-10-CM | POA: Diagnosis not present

## 2018-02-06 DIAGNOSIS — E119 Type 2 diabetes mellitus without complications: Secondary | ICD-10-CM

## 2018-02-06 DIAGNOSIS — I10 Essential (primary) hypertension: Secondary | ICD-10-CM

## 2018-02-06 DIAGNOSIS — K760 Fatty (change of) liver, not elsewhere classified: Secondary | ICD-10-CM | POA: Diagnosis not present

## 2018-02-06 LAB — COMPLETE METABOLIC PANEL WITH GFR
AG Ratio: 1.2 (calc) (ref 1.0–2.5)
ALKALINE PHOSPHATASE (APISO): 54 U/L (ref 33–130)
ALT: 18 U/L (ref 6–29)
AST: 21 U/L (ref 10–35)
Albumin: 3.8 g/dL (ref 3.6–5.1)
BUN: 16 mg/dL (ref 7–25)
CO2: 23 mmol/L (ref 20–32)
CREATININE: 0.86 mg/dL (ref 0.50–0.99)
Calcium: 10.5 mg/dL — ABNORMAL HIGH (ref 8.6–10.4)
Chloride: 107 mmol/L (ref 98–110)
GFR, Est African American: 80 mL/min/{1.73_m2} (ref >=60)
GFR, Est Non African American: 69 mL/min/{1.73_m2} (ref >=60)
GLUCOSE: 215 mg/dL — AB (ref 65–139)
Globulin: 3.3 g/dL (calc) (ref 1.9–3.7)
Potassium: 4.3 mmol/L (ref 3.5–5.3)
SODIUM: 140 mmol/L (ref 135–146)
Total Bilirubin: 0.4 mg/dL (ref 0.2–1.2)
Total Protein: 7.1 g/dL (ref 6.1–8.1)

## 2018-02-06 LAB — TSH: TSH: 0.31 mIU/L — ABNORMAL LOW (ref 0.40–4.50)

## 2018-02-07 ENCOUNTER — Encounter: Payer: Self-pay | Admitting: Internal Medicine

## 2018-02-07 LAB — LIPID PANEL
Cholesterol: 179 mg/dL (ref <200)
HDL: 44 mg/dL — AB (ref >50)
LDL Cholesterol (Calc): 98 mg/dL (calc)
Non-HDL Cholesterol (Calc): 135 mg/dL (calc) — ABNORMAL HIGH (ref <130)
TRIGLYCERIDES: 259 mg/dL — AB (ref <150)
Total CHOL/HDL Ratio: 4.1 (calc) (ref <5.0)

## 2018-02-07 LAB — T3: T3, Total: 106 ng/dL (ref 76–181)

## 2018-02-07 LAB — T4, FREE: FREE T4: 1.3 ng/dL (ref 0.8–1.8)

## 2018-02-09 ENCOUNTER — Ambulatory Visit (HOSPITAL_COMMUNITY): Payer: Medicare HMO | Attending: Cardiovascular Disease

## 2018-02-09 ENCOUNTER — Other Ambulatory Visit: Payer: Self-pay

## 2018-02-09 DIAGNOSIS — R5382 Chronic fatigue, unspecified: Secondary | ICD-10-CM | POA: Diagnosis not present

## 2018-02-09 DIAGNOSIS — R0789 Other chest pain: Secondary | ICD-10-CM

## 2018-02-09 DIAGNOSIS — R9430 Abnormal result of cardiovascular function study, unspecified: Secondary | ICD-10-CM | POA: Diagnosis not present

## 2018-02-19 IMAGING — NM NM MISC PROCEDURE
3 series · 18 of 18 positions shown · non-contrast
Comparison: none

[Series 1: wbr_r-proj_st rest_(id)_sa · 6.5mm · 6.51mm/px · 6 of 64 frames shown]
[frame 6/64]
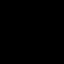
[frame 16/64]
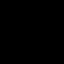
[frame 27/64]
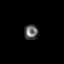
[frame 38/64]
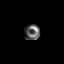
[frame 48/64]
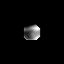
[frame 59/64]
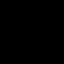

[Series 1: wbr_s-proj_st stress_(id)_sa · 6.5mm · 6.51mm/px · 6 of 512 frames shown (1 of 2)]
[frame 43/512]
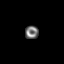
[frame 128/512]
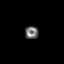
[frame 214/512]
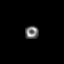
[frame 299/512]
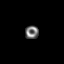
[frame 384/512]
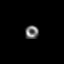
[frame 470/512]
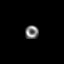

[Series 1: wbr_s-proj_st stress_(id)_sa · 6.5mm · 6.51mm/px · 6 of 64 frames shown (2 of 2)]
[frame 6/64]
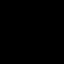
[frame 16/64]
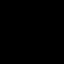
[frame 27/64]
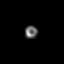
[frame 38/64]
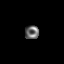
[frame 48/64]
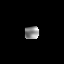
[frame 59/64]
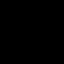

[18 of 18 positions shown; findings below may reference images not displayed]

Canned report from images found in remote index.

Refer to host system for actual result text.

## 2018-02-27 ENCOUNTER — Encounter: Payer: Self-pay | Admitting: "Endocrinology

## 2018-02-27 ENCOUNTER — Ambulatory Visit: Payer: Medicare HMO | Admitting: "Endocrinology

## 2018-02-27 ENCOUNTER — Other Ambulatory Visit: Payer: Self-pay | Admitting: Family Medicine

## 2018-02-27 ENCOUNTER — Encounter: Payer: Medicare HMO | Attending: Family Medicine | Admitting: Nutrition

## 2018-02-27 VITALS — BP 132/75 | HR 61 | Ht 65.0 in | Wt 205.0 lb

## 2018-02-27 DIAGNOSIS — I10 Essential (primary) hypertension: Secondary | ICD-10-CM

## 2018-02-27 DIAGNOSIS — E1159 Type 2 diabetes mellitus with other circulatory complications: Secondary | ICD-10-CM | POA: Diagnosis not present

## 2018-02-27 DIAGNOSIS — Z713 Dietary counseling and surveillance: Secondary | ICD-10-CM | POA: Insufficient documentation

## 2018-02-27 DIAGNOSIS — E890 Postprocedural hypothyroidism: Secondary | ICD-10-CM

## 2018-02-27 DIAGNOSIS — E118 Type 2 diabetes mellitus with unspecified complications: Secondary | ICD-10-CM

## 2018-02-27 DIAGNOSIS — E1165 Type 2 diabetes mellitus with hyperglycemia: Secondary | ICD-10-CM

## 2018-02-27 DIAGNOSIS — IMO0002 Reserved for concepts with insufficient information to code with codable children: Secondary | ICD-10-CM

## 2018-02-27 NOTE — Patient Instructions (Signed)

## 2018-02-27 NOTE — Progress Notes (Signed)
Endocrinology Consult Note       02/27/2018, 12:16 PM   Subjective:    Patient ID: [name], female    DOB: [birth year]  ANGELISA WINTHROP is being seen in consultation for management of currently uncontrolled symptomatic diabetes requested by  Susy Frizzle, MD.   Past Medical History:  Diagnosis Date  . Anginal pain ([place]) 03/19/2012   saw Dr. Cathie Olden .. she thinks its more related to stomach issues  . Arthritis    "all over; mostly in my back" (03/20/2012)  . Breast cancer ([place])    "right" (03/20/2012)  . Cardiomyopathy   . Chest pain 06/2005   Hospitalized, dystolic dysfunction  . Chronic lower back pain    "have 2 herniated discs; going to have to have a fusion" (03/20/2012)  . Family history of anesthesia complication    "father has nausea and vomiting"  . Gallstones   . GERD (gastroesophageal reflux disease)   . H/O hiatal hernia   . History of kidney stones   . History of right mastectomy   . Hypertension    "patient states never had HTN, takes Hyzaar for heart  . Hypothyroidism   . Irritable bowel syndrome   . Migraines    "it's been a long time since I've had one" (03/20/2012)  . Osteoarthritis   . Peripheral neuropathy   . PONV (postoperative nausea and vomiting)   . Sciatica   . Type II diabetes mellitus ([place])    Past Surgical History:  Procedure Laterality Date  . ABDOMINAL HYSTERECTOMY  1993  . adenosin cardiolite  07/2005   (+) wall motion abnormality  . AXILLARY LYMPH NODE DISSECTION  2003   squamous cell cancer  . BACK SURGERY    . CARDIAC CATHETERIZATION  ? 2005 / 2007  . CHOLECYSTECTOMY N/A 01/24/2018   Procedure: LAPAROSCOPIC CHOLECYSTECTOMY;  Surgeon: Erroll Luna, MD;  Location: [facility];  Service: General;  Laterality: N/A;  . CT abdomen and pelvis  02/2010   Same  . ESOPHAGOGASTRODUODENOSCOPY  2005   GERD  . KNEE ARTHROSCOPY  1990's   "right"  (03/20/2012)  . LUMBAR FUSION N/A 08/22/2014   Procedure: Right L2-3 and right L1-2 transforaminal lumbar interbody fusion with pedicle screws, rods, sleeves, cages, local bone graft, Vivigen, cancellous chips;  Surgeon: Jessy Oto, MD;  Location: [facility];  Service: Orthopedics;  Laterality: N/A;  . LUMBAR LAMINECTOMY/DECOMPRESSION MICRODISCECTOMY N/A 02/04/2014   Procedure: RIGHT L2-3 MICRODISCECTOMY ;  Surgeon: Jessy Oto, MD;  Location: [facility];  Service: Orthopedics;  Laterality: N/A;  . MASTECTOMY WITH AXILLARY LYMPH NODE DISSECTION  12/2003   "right" (03/20/2012)  . POSTERIOR CERVICAL FUSION/FORAMINOTOMY  2001  . SHOULDER ARTHROSCOPY W/ ROTATOR CUFF REPAIR  2003   Left  . THYROIDECTOMY  1977   Right  . TOTAL KNEE ARTHROPLASTY  2000   Right  . [place] of abdomen  07/2005   Fatty liver / kidney stones   Social History   Socioeconomic History  . Marital status: Married    Spouse name: Not on file  . Number of children: 1  . Years of education:  Not on file  . Highest education level: Not on file  Occupational History  . Occupation: Retired since 2003  Social Needs  . Financial resource strain: Not on file  . Food insecurity:    Worry: Not on file    Inability: Not on file  . Transportation needs:    Medical: Not on file    Non-medical: Not on file  Tobacco Use  . Smoking status: Never Smoker  . Smokeless tobacco: Never Used  Substance and Sexual Activity  . Alcohol use: No  . Drug use: No  . Sexual activity: Not on file  Lifestyle  . Physical activity:    Days per week: Not on file    Minutes per session: Not on file  . Stress: Not on file  Relationships  . Social connections:    Talks on phone: Not on file    Gets together: Not on file    Attends religious service: Not on file    Active member of club or organization: Not on file    Attends meetings of clubs or organizations: Not on file    Relationship status: Not on file  Other Topics Concern  . Not on file   Social History Narrative  . Not on file   Outpatient Encounter Medications as of 02/27/2018  Medication Sig  . insulin NPH-regular Human (70-30) 100 UNIT/ML injection Inject 40 Units into the skin 2 (two) times daily before a meal.  . acetaminophen (TYLENOL) 500 MG tablet Take 1,000 mg by mouth every 6 (six) hours as needed for moderate pain or headache.  [name] Kitchen amLODipine (NORVASC) 10 MG tablet Take 1 tablet (10 mg total) by mouth daily.  [name] Kitchen aspirin 81 MG tablet Take 81 mg by mouth daily.  . Blood Glucose Calibration (TRUE METRIX LEVEL 2) Normal SOLN Use as directed  . Blood Glucose Monitoring Suppl (TRUE METRIX AIR GLUCOSE METER) w/Device KIT 1 kit by Does not apply route 3 (three) times daily.  [name] Kitchen esomeprazole (NEXIUM) 40 MG capsule Take 1 capsule (40 mg total) by mouth daily.  . fluticasone (FLONASE) 50 MCG/ACT nasal spray Place 2 sprays into both nostrils daily. (Patient taking differently: Place 2 sprays into both nostrils daily as needed for allergies. )  . gabapentin (NEURONTIN) 300 MG capsule Take 1 capsule (300 mg total) by mouth 3 (three) times daily.  [name] Kitchen glucose blood (TRUE METRIX BLOOD GLUCOSE TEST) test strip Use as instructed  . ibuprofen (ADVIL,MOTRIN) 800 MG tablet Take 1 tablet (800 mg total) by mouth every 8 (eight) hours as needed.  [name] Kitchen levothyroxine (SYNTHROID, LEVOTHROID) 100 MCG tablet Take 1 tablet (100 mcg total) by mouth daily.  [name] Kitchen lidocaine (XYLOCAINE) 2 % solution Use as directed 15 mLs in the mouth or throat every 6 (six) hours as needed for mouth pain.  [name] Kitchen losartan (COZAAR) 50 MG tablet Take 1 tablet (50 mg total) by mouth daily.  . metFORMIN (GLUCOPHAGE) 850 MG tablet Take 1 tablet (850 mg total) by mouth 2 (two) times daily with a meal.  . mometasone (ELOCON) 0.1 % cream Apply 1 application topically daily.  [name] Kitchen oxyCODONE (OXY IR/ROXICODONE) 5 MG immediate release tablet Take 1 tablet (5 mg total) by mouth every 6 (six) hours as needed for severe pain.  . polyethylene glycol  (MIRALAX / GLYCOLAX) packet Take 17 g by mouth daily as needed for moderate constipation.  . TRUEPLUS LANCETS 33G MISC Use as directed  . [DISCONTINUED] insulin NPH-regular Human (NOVOLIN 70/30 RELION) (70-30) 100 UNIT/ML  injection 45 u qam & 25 u qpm (Patient taking differently: Inject 30-65 Units into the skin See admin instructions. Inject 65 units SQ in the morning and inject 30 units SQ in the evening)   No facility-administered encounter medications on file as of 02/27/2018.     ALLERGIES: Allergies  Allergen Reactions  . Dilaudid [Hydromorphone Hcl] Nausea And Vomiting  . Lipitor [Atorvastatin] Other (See Comments)    Body & Muscle Aches  . Adhesive [Tape] Rash  . Ampicillin Rash and Other (See Comments)    REACTION: Rash and welts on her hands when she took it in the hospital  . Latex Itching, Dermatitis, Rash and Other (See Comments)    REACTION: Patient had a reaction to tape after surgery and they told her she was allergic to Latex 03/20/2012 "if it's on too long it will break me out"  . Penicillins Rash and Other (See Comments)    Has patient had a PCN reaction causing immediate rash, facial/tongue/throat swelling, SOB or lightheadedness with hypotension: No Has patient had a PCN reaction causing severe rash involving mucus membranes or skin necrosis: No Has patient had a PCN reaction that required hospitalization: Yes - in hospital receiving treatment Has patient had a PCN reaction occurring within the last 10 years: No If all of the above answers are "NO", then may proceed with Cephalosporin use.     VACCINATION STATUS: Immunization History  Administered Date(s) Administered  . Influenza Split 01/31/2011  . Influenza Whole 02/23/2007  . Influenza, High Dose Seasonal PF 01/10/2017  . Influenza,inj,Quad PF,6+ Mos 01/07/2014, 02/09/2016, 01/01/2018  . Influenza-Unspecified 01/10/2012  . Pneumococcal Polysaccharide-23 08/25/2014  . Tdap 04/20/2011    Diabetes  She  presents for her initial diabetic visit. She has type 2 diabetes mellitus. Onset time: She was diagnosed at approximate age of 28 years. Her disease course has been worsening. There are no hypoglycemic associated symptoms. Pertinent negatives for hypoglycemia include no confusion, headaches, pallor or seizures. Associated symptoms include blurred vision, fatigue, foot ulcerations and polyuria. Pertinent negatives for diabetes include no chest pain, no polydipsia and no polyphagia. There are no hypoglycemic complications. Symptoms are worsening. Diabetic complications include retinopathy. Risk factors for coronary artery disease include diabetes mellitus, hypertension, obesity, sedentary lifestyle and post-menopausal. Current diabetic treatment includes insulin injections (She is currently on Novolin 70/30 65 units a.m., 35 units p.m. along with metformin 850 mg p.o. twice daily.). Her weight is fluctuating minimally. She is following a generally unhealthy diet. When asked about meal planning, she reported none. She has not had a previous visit with a dietitian. She never participates in exercise. (She did not bring any meter nor logs to review.  Her most recent A1c was 10.4% on January 19, 2018.) An ACE inhibitor/angiotensin II receptor blocker is being taken. Eye exam is current.  Hypertension  This is a chronic problem. The current episode started more than 1 year ago. Associated symptoms include blurred vision. Pertinent negatives include no chest pain, headaches, palpitations or shortness of breath. Risk factors for coronary artery disease include diabetes mellitus, obesity and sedentary lifestyle. Past treatments include angiotensin blockers. Hypertensive end-organ damage includes retinopathy. Identifiable causes of hypertension include a thyroid problem.  Thyroid Problem  Presents for initial visit. Onset time: She underwent what appears to be total thyroidectomy in her 30s for reportedly benign goiter.   Has taken thyroid hormone ever since, currently levothyroxine 100 mcg p.o. nightly. Symptoms include fatigue. Patient reports no cold intolerance, diarrhea, heat intolerance or  palpitations. Past treatments include levothyroxine. Prior procedures include thyroidectomy.      Review of Systems  Constitutional: Positive for fatigue. Negative for chills, fever and unexpected weight change.  HENT: Negative for trouble swallowing and voice change.   Eyes: Positive for blurred vision. Negative for visual disturbance.  Respiratory: Negative for cough, shortness of breath and wheezing.   Cardiovascular: Negative for chest pain, palpitations and leg swelling.  Gastrointestinal: Negative for diarrhea, nausea and vomiting.  Endocrine: Positive for polyuria. Negative for cold intolerance, heat intolerance, polydipsia and polyphagia.  Musculoskeletal: Negative for arthralgias and myalgias.  Skin: Negative for color change, pallor, rash and wound.  Neurological: Negative for seizures and headaches.  Psychiatric/Behavioral: Negative for confusion and suicidal ideas.    Objective:    BP 132/75   Pulse 61   Ht '5\' 5"'$  (1.651 m)   Wt 205 lb (93 kg)   BMI 34.11 kg/m   Wt Readings from Last 3 Encounters:  02/27/18 205 lb (93 kg)  02/05/18 204 lb (92.5 kg)  01/24/18 203 lb (92.1 kg)     Physical Exam  Constitutional: She is oriented to person, place, and time. She appears well-developed.  HENT:  Head: Normocephalic and atraumatic.  Eyes: EOM are normal.  Neck: Normal range of motion. Neck supple. No tracheal deviation present. No thyromegaly present.  Post thyroidectomy scar on anterior lower neck.  Palpable thyroid.  Cardiovascular: Normal rate and regular rhythm.  Pulmonary/Chest: Effort normal and breath sounds normal.  Abdominal: Soft. Bowel sounds are normal. There is no tenderness. There is no guarding.  Musculoskeletal: Normal range of motion. She exhibits edema.  Neurological: She is  alert and oriented to person, place, and time. She has normal reflexes. No cranial nerve deficit. Coordination normal.  Skin: Skin is warm and dry. No rash noted. No erythema. No pallor.  Psychiatric: She has a normal mood and affect. Judgment normal.    CMP ( most recent) CMP     Component Value Date/Time   NA 140 02/05/2018 1108   NA 135 08/14/2009 1400   K 4.3 02/05/2018 1108   K 4.1 08/14/2009 1400   CL 107 02/05/2018 1108   CL 99 08/14/2009 1400   CO2 23 02/05/2018 1108   CO2 27 08/14/2009 1400   GLUCOSE 215 (H) 02/05/2018 1108   GLUCOSE 235 (H) 08/14/2009 1400   BUN 16 02/05/2018 1108   BUN 11 08/14/2009 1400   CREATININE 0.86 02/05/2018 1108   CALCIUM 10.5 (H) 02/05/2018 1108   CALCIUM 10.2 08/14/2009 1400   PROT 7.1 02/05/2018 1108   PROT 7.1 08/14/2009 1400   ALBUMIN 3.8 01/19/2018 1123   ALBUMIN 3.9 08/14/2009 1400   AST 21 02/05/2018 1108   AST 32 08/14/2009 1400   ALT 18 02/05/2018 1108   ALT 32 08/14/2009 1400   ALKPHOS 53 01/19/2018 1123   ALKPHOS 65 08/14/2009 1400   BILITOT 0.4 02/05/2018 1108   BILITOT 0.70 08/14/2009 1400   GFRNONAA 69 02/05/2018 1108   GFRAA 80 02/05/2018 1108    Diabetic Labs (most recent): Lab Results  Component Value Date   HGBA1C 10.4 (H) 01/19/2018   HGBA1C 9.3 (H) 12/19/2016   HGBA1C 11.3 09/07/2016     Lipid Panel ( most recent) Lipid Panel     Component Value Date/Time   CHOL 179 02/06/2018 1508   TRIG 259 (H) 02/06/2018 1508   HDL 44 (L) 02/06/2018 1508   CHOLHDL 4.1 02/06/2018 1508   VLDL 49 (H) 06/30/2016 3825  [place] 98 02/06/2018 1508      Lab Results  Component Value Date   TSH 0.31 (L) 02/05/2018   TSH 2.23 12/19/2016   TSH 2.31 06/30/2016   TSH 2.78 05/27/2015   TSH 2.382 07/28/2014   TSH 1.787 03/20/2012   TSH 1.91 10/14/2010   TSH 1.88 02/25/2010   TSH 0.69 09/21/2009   TSH 0.85 03/18/2009   FREET4 1.3 02/06/2018   FREET4 1.1 12/19/2016   FREET4 1.0 05/27/2015           Assessment  & Plan:   1. DM type 2 causing vascular disease ([place])  - YENY SCHMOLL has currently uncontrolled symptomatic type 2 DM since 67 years of age,  with most recent A1c of 10.4 %. Recent labs reviewed.  -her diabetes is complicated by retinopathy, obesity/sedentary life, and she remains at a high risk for more acute and chronic complications which include CAD, CVA, CKD, retinopathy, and neuropathy. These are all discussed in detail with her.  - I have counseled her on diet management and weight loss, by adopting a carbohydrate restricted/protein rich diet.  - Suggestion is made for her to avoid simple carbohydrates  from her diet including Cakes, Sweet Desserts, Ice Cream, Soda (diet and regular), Sweet Tea, Candies, Chips, Cookies, Store Bought Juices, Alcohol in Excess of  1-2 drinks a day, Artificial Sweeteners,  Coffee Creamer, and "Sugar-free" Products. This will help patient to have more stable blood glucose profile and potentially avoid unintended weight gain.  - I encouraged her to switch to  unprocessed or minimally processed complex starch and increased protein intake (animal or plant source), fruits, and vegetables.  - she is advised to stick to a routine mealtimes to eat 3 meals  a day and avoid unnecessary snacks ( to snack only to correct hypoglycemia).   - she will be scheduled with Jearld Fenton, RDN, CDE for individualized diabetes education.  - I have approached her with the following individualized plan to manage diabetes and patient agrees:   -Based on her current and prevailing glycemic burden, she may require insulin analogs for intensive treatment, however she reports that she has some supplies of Novolin 70/30 to use before switching.   -She is advised to readjust her insulin Novolin 70/30 to 40 units daily with breakfast and 40 units daily with supper for pre-meal blood glucose readings above 90 mg/dL,  associated with strict monitoring of glucose 4 times a day-before  meals and at bedtime. - she is warned not to take insulin without proper monitoring per orders. - she is encouraged to call clinic for blood glucose levels less than 70 or above 300 mg /dl. - I will continue metformin 850 mg p.o. twice daily, therapeutically suitable for patient . -If her glycemic profile is not controlled she will be considered for basal/bolus insulin. - Patient specific target  A1c;  LDL, HDL, Triglycerides, and  Waist Circumference were discussed in detail.  2) BP/HTN:  her blood pressure is  controlled to target.   she is advised to continue her current medications including losartan 50 mg p.o. daily with breakfast . 3) Lipids/HPL:   Review of her recent lipid panel showed uncontrolled  LDL at 98.  she has documented adverse reactions to Lipitor.  She will be considered for low-dose Niaspan on next visit.    4)  Weight/Diet:  Body mass index is 34.11 kg/m.  - clearly complicating her diabetes care.  I discussed with her the fact that loss of  5 - 10% of her  current body weight will have the most impact on her diabetes management.  CDE Consult will be initiated . Exercise, and detailed carbohydrates information provided  -  detailed on discharge instructions.  5) hypothyroidism-postsurgical   -She gives a remote past history of thyroidectomy for reportedly benign goiter.  She will have surveillance thyroid/neck ultrasound.  Her most recent thyroid function tests are consistent with appropriate replacement.   She is advised to continue levothyroxine 100 mcg p.o. every morning for now.  - We discussed about correct intake of levothyroxine, at fasting, with water, separated by at least 30 minutes from breakfast, and separated by more than 4 hours from calcium, iron, multivitamins, acid reflux medications (PPIs). -Patient is made aware of the fact that thyroid hormone replacement is needed for life, dose to be adjusted by periodic monitoring of thyroid function tests.  6) Chronic  Care/Health Maintenance:  -she  is on ARB and Statin medications and  is encouraged to initiate and continue to follow up with Ophthalmology, Dentist,  Podiatrist at least yearly or according to recommendations, and advised to  stay away from smoking. I have recommended yearly flu vaccine and pneumonia vaccine at least every 5 years; moderate intensity exercise for up to 150 minutes weekly; and  sleep for at least 7 hours a day.  - I advised patient to maintain close follow up with Susy Frizzle, MD for primary care needs.  - Time spent with the patient: 45 minutes, of which >50% was spent in obtaining information about her symptoms, reviewing her previous labs, evaluations, and treatments, counseling her about her currently uncontrolled, complicated type 2 diabetes, postsurgical hypothyroidism and developing plans for long term treatment based on the latest recommendations.  [name] participated in the discussions, expressed understanding, and voiced agreement with the above plans.  All questions were answered to her satisfaction. she is encouraged to contact clinic should she have any questions or concerns prior to her return visit.  Follow up plan: - Return in about 2 weeks (around 03/13/2018) for Follow up with Meter and Logs Only - no Labs.  Glade Lloyd, MD [facility] [address] [place], [place] [address] Phone: [phone]  Fax: [phone]    02/27/2018, 12:16 PM  This note was partially dictated with voice recognition software. Similar sounding words can be transcribed inadequately or may not  be corrected upon review.

## 2018-02-28 ENCOUNTER — Other Ambulatory Visit: Payer: Self-pay

## 2018-02-28 MED ORDER — METFORMIN HCL 850 MG PO TABS: 850.0000 mg | ORAL_TABLET | Freq: Two times a day (BID) | ORAL | 0 refills | Status: DC

## 2018-03-02 ENCOUNTER — Ambulatory Visit: Payer: Medicare HMO | Admitting: Internal Medicine

## 2018-03-02 ENCOUNTER — Encounter: Payer: Self-pay | Admitting: Internal Medicine

## 2018-03-02 VITALS — BP 150/70 | HR 72 | Ht 64.0 in | Wt 210.1 lb

## 2018-03-02 DIAGNOSIS — K219 Gastro-esophageal reflux disease without esophagitis: Secondary | ICD-10-CM

## 2018-03-02 DIAGNOSIS — R0789 Other chest pain: Secondary | ICD-10-CM | POA: Diagnosis not present

## 2018-03-02 DIAGNOSIS — M940 Chondrocostal junction syndrome [Tietze]: Secondary | ICD-10-CM | POA: Diagnosis not present

## 2018-03-02 NOTE — Patient Instructions (Signed)
Please follow up as needed 

## 2018-03-02 NOTE — Progress Notes (Signed)
HISTORY OF PRESENT ILLNESS:  [name] is a 67 y.o. female with multiple medical problems as listed below.  She is sent today by Dr. Dennard Schaumann regarding chronic chest pain.  Patient has had long-standing noncardiac chest pain with multiple emergency room visits.  4 weeks ago she underwent cholecystectomy for gallstones as a possible cause for chest pain.  Unfortunately, her problems with pain have continued.  Previous GI care with Dr. Laurence Spates of [place].  I have obtained outside records and reviewed those records.  Colonoscopy in 2011 revealed diverticulosis.  Upper endoscopy in 2012 was normal.  Review of most recent blood work from October 2019 shows normal CBC and normal liver test.  Review of CT scan from September 2019 shows no acute abnormalities.  The patient tells me that her problems with chest pain are essentially daily.  She describes this as an ache which typically last 5 or 10 minutes.  The discomfort is most predictably reproduced or exacerbated by physical activity such as housework.  There is no change with meals.  No nocturnal component.  She does have mild nausea.  For reflux she has been on Nexium over the past month.  This helps.  No dysphasia.  Patient cannot describe any relieving factors.  It may occur at rest as well.  Seems to favor the middle of her chest just left of center.  This constantly worries her.  REVIEW OF SYSTEMS:  All non-GI ROS negative unless otherwise stated in the HPI except for anxiety, arthritis, back pain, headaches  Past Medical History:  Diagnosis Date  . Anginal pain ([place]) 03/19/2012   saw Dr. Cathie Olden .. she thinks its more related to stomach issues  . Arthritis    "all over; mostly in my back" (03/20/2012)  . Breast cancer ([place])    "right" (03/20/2012)  . Cardiomyopathy   . Chest pain 06/2005   Hospitalized, dystolic dysfunction  . Chronic lower back pain    "have 2 herniated discs; going to have to have a fusion" (03/20/2012)  . Family  history of anesthesia complication    "father has nausea and vomiting"  . Gallstones   . GERD (gastroesophageal reflux disease)   . H/O hiatal hernia   . History of kidney stones   . History of right mastectomy   . Hypertension    "patient states never had HTN, takes Hyzaar for heart  . Hypothyroidism   . Irritable bowel syndrome   . Migraines    "it's been a long time since I've had one" (03/20/2012)  . Osteoarthritis   . Peripheral neuropathy   . PONV (postoperative nausea and vomiting)   . Sciatica   . Type II diabetes mellitus ([place])     Past Surgical History:  Procedure Laterality Date  . ABDOMINAL HYSTERECTOMY  1993  . adenosin cardiolite  07/2005   (+) wall motion abnormality  . AXILLARY LYMPH NODE DISSECTION  2003   squamous cell cancer  . BACK SURGERY    . CARDIAC CATHETERIZATION  ? 2005 / 2007  . CHOLECYSTECTOMY N/A 01/24/2018   Procedure: LAPAROSCOPIC CHOLECYSTECTOMY;  Surgeon: Erroll Luna, MD;  Location: [facility];  Service: General;  Laterality: N/A;  . CT abdomen and pelvis  02/2010   Same  . ESOPHAGOGASTRODUODENOSCOPY  2005   GERD  . KNEE ARTHROSCOPY  1990's   "right" (03/20/2012)  . LUMBAR FUSION N/A 08/22/2014   Procedure: Right L2-3 and right L1-2 transforaminal lumbar interbody fusion with pedicle screws, rods, sleeves, cages,  local bone graft, Vivigen, cancellous chips;  Surgeon: Jessy Oto, MD;  Location: [facility];  Service: Orthopedics;  Laterality: N/A;  . LUMBAR LAMINECTOMY/DECOMPRESSION MICRODISCECTOMY N/A 02/04/2014   Procedure: RIGHT L2-3 MICRODISCECTOMY ;  Surgeon: Jessy Oto, MD;  Location: [facility];  Service: Orthopedics;  Laterality: N/A;  . MASTECTOMY WITH AXILLARY LYMPH NODE DISSECTION  12/2003   "right" (03/20/2012)  . POSTERIOR CERVICAL FUSION/FORAMINOTOMY  2001  . SHOULDER ARTHROSCOPY W/ ROTATOR CUFF REPAIR  2003   Left  . THYROIDECTOMY  1977   Right  . TOTAL KNEE ARTHROPLASTY  2000   Right  . [place] of abdomen  07/2005   Fatty liver /  kidney stones    Social History [name]  reports that she has never smoked. She has never used smokeless tobacco. She reports that she does not drink alcohol or use drugs.  family history includes Breast cancer in her maternal aunt; Deep vein thrombosis in her daughter; Diabetes in her brother, brother, father, and other; Hypertension in her brother, brother, father, mother, other, and sister.  Allergies  Allergen Reactions  . Dilaudid [Hydromorphone Hcl] Nausea And Vomiting  . Lipitor [Atorvastatin] Other (See Comments)    Body & Muscle Aches  . Adhesive [Tape] Rash  . Ampicillin Rash and Other (See Comments)    REACTION: Rash and welts on her hands when she took it in the hospital  . Latex Itching, Dermatitis, Rash and Other (See Comments)    REACTION: Patient had a reaction to tape after surgery and they told her she was allergic to Latex 03/20/2012 "if it's on too long it will break me out"  . Penicillins Rash and Other (See Comments)    Has patient had a PCN reaction causing immediate rash, facial/tongue/throat swelling, SOB or lightheadedness with hypotension: No Has patient had a PCN reaction causing severe rash involving mucus membranes or skin necrosis: No Has patient had a PCN reaction that required hospitalization: Yes - in hospital receiving treatment Has patient had a PCN reaction occurring within the last 10 years: No If all of the above answers are "NO", then may proceed with Cephalosporin use.        PHYSICAL EXAMINATION: Vital signs: BP (!) 150/70 (BP Location: Left Arm, Patient Position: Sitting, Cuff Size: Normal)   Pulse 72   Ht 5\' 4"  (1.626 m) Comment: pt stated height  Wt 210 lb 2 oz (95.3 kg)   BMI 36.07 kg/m   Constitutional: Pleasant, obese, generally well-appearing, no acute distress Psychiatric: alert and oriented x3, cooperative Eyes: extraocular movements intact, anicteric, conjunctiva pink Mouth: oral pharynx moist, no lesions Neck:  supple no lymphadenopathy Cardiovascular: heart regular rate and rhythm, no murmur Chest wall: Patient has chest wall tenderness with palpation in the region troubles her.  This is reminiscent of her discomfort Lungs: clear to auscultation bilaterally Abdomen: soft, nontender, nondistended, no obvious ascites, no peritoneal signs, normal bowel sounds, no organomegaly Rectal: Omitted Extremities: no, cyanosis, or lower extremity edema bilaterally Skin: no lesions on visible extremities Neuro: No focal deficits.  Cranial nerves intact  ASSESSMENT:  1.  Costochondritis/musculoskeletal chest wall pain 2.  GERD without alarm features. 3.  Previous GI evaluations (endoscopic) as described 4.  Multiple medical problems  PLAN:  1.  Reassurance 2.  Symptomatic therapies.  Can discuss with PCP 3.  Reflux precautions including weight loss to assist with management of chronic GERD 4.  Continue PPI.  Lowest dose to control symptoms recommended 5.  Return  to the care of Dr. Dennard Schaumann  45 minutes spent face-to-face with the patient.  Greater than 50% of the time used for counseling regarding her chronic and concerning to her chest pain

## 2018-03-13 ENCOUNTER — Encounter: Payer: Self-pay | Admitting: Nutrition

## 2018-03-13 ENCOUNTER — Encounter: Payer: Self-pay | Admitting: "Endocrinology

## 2018-03-13 ENCOUNTER — Encounter: Payer: Medicare HMO | Attending: Family Medicine | Admitting: Nutrition

## 2018-03-13 ENCOUNTER — Ambulatory Visit (INDEPENDENT_AMBULATORY_CARE_PROVIDER_SITE_OTHER): Payer: Medicare HMO | Admitting: "Endocrinology

## 2018-03-13 VITALS — BP 148/72 | HR 70 | Ht 64.0 in | Wt 213.0 lb

## 2018-03-13 DIAGNOSIS — E1159 Type 2 diabetes mellitus with other circulatory complications: Secondary | ICD-10-CM

## 2018-03-13 DIAGNOSIS — E1165 Type 2 diabetes mellitus with hyperglycemia: Secondary | ICD-10-CM | POA: Insufficient documentation

## 2018-03-13 DIAGNOSIS — E890 Postprocedural hypothyroidism: Secondary | ICD-10-CM

## 2018-03-13 DIAGNOSIS — I10 Essential (primary) hypertension: Secondary | ICD-10-CM

## 2018-03-13 DIAGNOSIS — E669 Obesity, unspecified: Secondary | ICD-10-CM | POA: Insufficient documentation

## 2018-03-13 DIAGNOSIS — E118 Type 2 diabetes mellitus with unspecified complications: Secondary | ICD-10-CM | POA: Diagnosis not present

## 2018-03-13 DIAGNOSIS — IMO0002 Reserved for concepts with insufficient information to code with codable children: Secondary | ICD-10-CM

## 2018-03-13 NOTE — Patient Instructions (Signed)
Goals 1 Follow My Plate 2. Eat three meals a day at timed discussed 3. Avoid snacks Cut out sodas, sweets and junk food Walk 15 minutes a day Test BS 4 times per day

## 2018-03-13 NOTE — Progress Notes (Signed)
Endocrinology follow-up Note       03/13/2018, 10:23 AM   Subjective:    Patient ID: [name], female    DOB: December 09, 1950.  [name] is being seen in follow-up for management of currently uncontrolled,complicated symptomatic type 2 diabetes and associated hypertension, hypothyroidism. PMD :  Susy Frizzle, MD.   Past Medical History:  Diagnosis Date  . Anginal pain ([place]) 03/19/2012   saw Dr. Cathie Olden .. she thinks its more related to stomach issues  . Arthritis    "all over; mostly in my back" (03/20/2012)  . Breast cancer ([place])    "right" (03/20/2012)  . Cardiomyopathy   . Chest pain 06/2005   Hospitalized, dystolic dysfunction  . Chronic lower back pain    "have 2 herniated discs; going to have to have a fusion" (03/20/2012)  . Family history of anesthesia complication    "father has nausea and vomiting"  . Gallstones   . GERD (gastroesophageal reflux disease)   . H/O hiatal hernia   . History of kidney stones   . History of right mastectomy   . Hypertension    "patient states never had HTN, takes Hyzaar for heart  . Hypothyroidism   . Irritable bowel syndrome   . Migraines    "it's been a long time since I've had one" (03/20/2012)  . Osteoarthritis   . Peripheral neuropathy   . PONV (postoperative nausea and vomiting)   . Sciatica   . Type II diabetes mellitus ([place])    Past Surgical History:  Procedure Laterality Date  . ABDOMINAL HYSTERECTOMY  1993  . adenosin cardiolite  07/2005   (+) wall motion abnormality  . AXILLARY LYMPH NODE DISSECTION  2003   squamous cell cancer  . BACK SURGERY    . CARDIAC CATHETERIZATION  ? 2005 / 2007  . CHOLECYSTECTOMY N/A 01/24/2018   Procedure: LAPAROSCOPIC CHOLECYSTECTOMY;  Surgeon: Erroll Luna, MD;  Location: [facility];  Service: General;  Laterality: N/A;  . CT abdomen and pelvis  02/2010   Same  . ESOPHAGOGASTRODUODENOSCOPY   2005   GERD  . KNEE ARTHROSCOPY  1990's   "right" (03/20/2012)  . LUMBAR FUSION N/A 08/22/2014   Procedure: Right L2-3 and right L1-2 transforaminal lumbar interbody fusion with pedicle screws, rods, sleeves, cages, local bone graft, Vivigen, cancellous chips;  Surgeon: Jessy Oto, MD;  Location: [facility];  Service: Orthopedics;  Laterality: N/A;  . LUMBAR LAMINECTOMY/DECOMPRESSION MICRODISCECTOMY N/A 02/04/2014   Procedure: RIGHT L2-3 MICRODISCECTOMY ;  Surgeon: Jessy Oto, MD;  Location: [facility];  Service: Orthopedics;  Laterality: N/A;  . MASTECTOMY WITH AXILLARY LYMPH NODE DISSECTION  12/2003   "right" (03/20/2012)  . POSTERIOR CERVICAL FUSION/FORAMINOTOMY  2001  . SHOULDER ARTHROSCOPY W/ ROTATOR CUFF REPAIR  2003   Left  . THYROIDECTOMY  1977   Right  . TOTAL KNEE ARTHROPLASTY  2000   Right  . [place] of abdomen  07/2005   Fatty liver / kidney stones   Social History   Socioeconomic History  . Marital status: Married    Spouse name: Not on file  . Number of children:  1  . Years of education: Not on file  . Highest education level: Not on file  Occupational History  . Occupation: Retired since 2003  Social Needs  . Financial resource strain: Not on file  . Food insecurity:    Worry: Not on file    Inability: Not on file  . Transportation needs:    Medical: Not on file    Non-medical: Not on file  Tobacco Use  . Smoking status: Never Smoker  . Smokeless tobacco: Never Used  Substance and Sexual Activity  . Alcohol use: No  . Drug use: No  . Sexual activity: Not on file  Lifestyle  . Physical activity:    Days per week: Not on file    Minutes per session: Not on file  . Stress: Not on file  Relationships  . Social connections:    Talks on phone: Not on file    Gets together: Not on file    Attends religious service: Not on file    Active member of club or organization: Not on file    Attends meetings of clubs or organizations: Not on file    Relationship status:  Not on file  Other Topics Concern  . Not on file  Social History Narrative  . Not on file   Outpatient Encounter Medications as of 03/13/2018  Medication Sig  . acetaminophen (TYLENOL) 500 MG tablet Take 1,000 mg by mouth every 6 (six) hours as needed for moderate pain or headache.  [name] Kitchen amLODipine (NORVASC) 10 MG tablet Take 1 tablet (10 mg total) by mouth daily.  [name] Kitchen aspirin 81 MG tablet Take 81 mg by mouth daily.  . Blood Glucose Calibration (TRUE METRIX LEVEL 2) Normal SOLN Use as directed  . Blood Glucose Monitoring Suppl (TRUE METRIX AIR GLUCOSE METER) w/Device KIT 1 kit by Does not apply route 3 (three) times daily.  [name] Kitchen esomeprazole (NEXIUM) 40 MG capsule Take 1 capsule (40 mg total) by mouth daily.  . fluticasone (FLONASE) 50 MCG/ACT nasal spray Place 2 sprays into both nostrils daily. (Patient taking differently: Place 2 sprays into both nostrils daily as needed for allergies. )  . gabapentin (NEURONTIN) 300 MG capsule TAKE 1 CAPSULE BY MOUTH THREE TIMES A DAY  . glucose blood (TRUE METRIX BLOOD GLUCOSE TEST) test strip Use as instructed  . ibuprofen (ADVIL,MOTRIN) 800 MG tablet Take 1 tablet (800 mg total) by mouth every 8 (eight) hours as needed.  . insulin NPH-regular Human (70-30) 100 UNIT/ML injection Inject 40 Units into the skin 2 (two) times daily before a meal.  . levothyroxine (SYNTHROID, LEVOTHROID) 100 MCG tablet Take 1 tablet (100 mcg total) by mouth daily.  [name] Kitchen lidocaine (XYLOCAINE) 2 % solution Use as directed 15 mLs in the mouth or throat every 6 (six) hours as needed for mouth pain.  [name] Kitchen losartan (COZAAR) 50 MG tablet Take 1 tablet (50 mg total) by mouth daily.  . metFORMIN (GLUCOPHAGE) 850 MG tablet Take 1 tablet (850 mg total) by mouth 2 (two) times daily with a meal. (Patient taking differently: Take 850 mg by mouth as needed. )  . mometasone (ELOCON) 0.1 % cream Apply 1 application topically daily.  [name] Kitchen oxyCODONE (OXY IR/ROXICODONE) 5 MG immediate release tablet Take 1 tablet  (5 mg total) by mouth every 6 (six) hours as needed for severe pain. (Patient not taking: Reported on 03/02/2018)  . polyethylene glycol (MIRALAX / GLYCOLAX) packet Take 17 g by mouth daily as needed for moderate constipation.  [name] Kitchen  TRUEPLUS LANCETS 33G MISC Use as directed   No facility-administered encounter medications on file as of 03/13/2018.     ALLERGIES: Allergies  Allergen Reactions  . Dilaudid [Hydromorphone Hcl] Nausea And Vomiting  . Lipitor [Atorvastatin] Other (See Comments)    Body & Muscle Aches  . Adhesive [Tape] Rash  . Ampicillin Rash and Other (See Comments)    REACTION: Rash and welts on her hands when she took it in the hospital  . Latex Itching, Dermatitis, Rash and Other (See Comments)    REACTION: Patient had a reaction to tape after surgery and they told her she was allergic to Latex 03/20/2012 "if it's on too long it will break me out"  . Penicillins Rash and Other (See Comments)    Has patient had a PCN reaction causing immediate rash, facial/tongue/throat swelling, SOB or lightheadedness with hypotension: No Has patient had a PCN reaction causing severe rash involving mucus membranes or skin necrosis: No Has patient had a PCN reaction that required hospitalization: Yes - in hospital receiving treatment Has patient had a PCN reaction occurring within the last 10 years: No If all of the above answers are "NO", then may proceed with Cephalosporin use.     VACCINATION STATUS: Immunization History  Administered Date(s) Administered  . Influenza Split 01/31/2011  . Influenza Whole 02/23/2007  . Influenza, High Dose Seasonal PF 01/10/2017  . Influenza,inj,Quad PF,6+ Mos 01/07/2014, 02/09/2016, 01/01/2018  . Influenza-Unspecified 01/10/2012  . Pneumococcal Polysaccharide-23 08/25/2014  . Tdap 04/20/2011    Diabetes  She presents for her follow-up diabetic visit. She has type 2 diabetes mellitus. Onset time: She was diagnosed at approximate age of 7 years.  Her disease course has been improving. There are no hypoglycemic associated symptoms. Pertinent negatives for hypoglycemia include no confusion, headaches, pallor or seizures. Associated symptoms include blurred vision, fatigue and foot ulcerations. Pertinent negatives for diabetes include no chest pain, no polydipsia, no polyphagia and no polyuria. There are no hypoglycemic complications. Symptoms are improving. Diabetic complications include retinopathy. Risk factors for coronary artery disease include diabetes mellitus, hypertension, obesity, sedentary lifestyle and post-menopausal. Current diabetic treatment includes insulin injections (She is currently on Novolin 70/30 65 units a.m., 35 units p.m. along with metformin 850 mg p.o. twice daily.). Her weight is increasing steadily. She is following a generally unhealthy diet. When asked about meal planning, she reported none. She has not had a previous visit with a dietitian. She never participates in exercise. Her breakfast blood glucose range is generally 140-180 mg/dl. Her lunch blood glucose range is generally 140-180 mg/dl. Her dinner blood glucose range is generally 140-180 mg/dl. Her bedtime blood glucose range is generally 140-180 mg/dl. Her overall blood glucose range is 140-180 mg/dl. (She returns with significantly improved glycemic profile, no hypoglycemia.   Her most recent A1c was 10.4% on January 19, 2018.) An ACE inhibitor/angiotensin II receptor blocker is being taken. Eye exam is current.  Hypertension  This is a chronic problem. The current episode started more than 1 year ago. Associated symptoms include blurred vision. Pertinent negatives include no chest pain, headaches, palpitations or shortness of breath. Risk factors for coronary artery disease include diabetes mellitus, obesity and sedentary lifestyle. Past treatments include angiotensin blockers. Hypertensive end-organ damage includes retinopathy. Identifiable causes of hypertension  include a thyroid problem.  Thyroid Problem  Presents for initial visit. Onset time: She underwent what appears to be total thyroidectomy in her 21s for reportedly benign goiter.  Has taken thyroid hormone ever since, currently  levothyroxine 100 mcg p.o. nightly. Symptoms include fatigue. Patient reports no cold intolerance, diarrhea, heat intolerance or palpitations. Past treatments include levothyroxine. Prior procedures include thyroidectomy.    Review of Systems  Constitutional: Positive for fatigue. Negative for chills, fever and unexpected weight change.  HENT: Negative for trouble swallowing and voice change.   Eyes: Positive for blurred vision. Negative for visual disturbance.  Respiratory: Negative for cough, shortness of breath and wheezing.   Cardiovascular: Negative for chest pain, palpitations and leg swelling.  Gastrointestinal: Negative for diarrhea, nausea and vomiting.  Endocrine: Negative for cold intolerance, heat intolerance, polydipsia, polyphagia and polyuria.  Musculoskeletal: Negative for arthralgias and myalgias.  Skin: Negative for color change, pallor, rash and wound.  Neurological: Negative for seizures and headaches.  Psychiatric/Behavioral: Negative for confusion and suicidal ideas.    Objective:    BP (!) 148/72   Pulse 70   Ht _0  (1.626 m)   Wt 213 lb (96.6 kg)   BMI 36.56 kg/m   Wt Readings from Last 3 Encounters:  03/13/18 213 lb (96.6 kg)  03/02/18 210 lb 2 oz (95.3 kg)  02/27/18 205 lb (93 kg)     Physical Exam  Constitutional: She is oriented to person, place, and time. She appears well-developed.  HENT:  Head: Normocephalic and atraumatic.  Eyes: EOM are normal.  Neck: Normal range of motion. Neck supple. No tracheal deviation present. No thyromegaly present.  Post thyroidectomy scar on anterior lower neck.  Palpable thyroid.  Cardiovascular: Normal rate and regular rhythm.  Pulmonary/Chest: Effort normal and breath sounds normal.   Abdominal: Soft. Bowel sounds are normal. There is no tenderness. There is no guarding.  Musculoskeletal: Normal range of motion. She exhibits edema.  Neurological: She is alert and oriented to person, place, and time. She has normal reflexes. No cranial nerve deficit. Coordination normal.  Skin: Skin is warm and dry. No rash noted. No erythema. No pallor.  Psychiatric: She has a normal mood and affect. Judgment normal.    CMP ( most recent) CMP     Component Value Date/Time   NA 140 02/05/2018 1108   NA 135 08/14/2009 1400   K 4.3 02/05/2018 1108   K 4.1 08/14/2009 1400   CL 107 02/05/2018 1108   CL 99 08/14/2009 1400   CO2 23 02/05/2018 1108   CO2 27 08/14/2009 1400   GLUCOSE 215 (H) 02/05/2018 1108   GLUCOSE 235 (H) 08/14/2009 1400   BUN 16 02/05/2018 1108   BUN 11 08/14/2009 1400   CREATININE 0.86 02/05/2018 1108   CALCIUM 10.5 (H) 02/05/2018 1108   CALCIUM 10.2 08/14/2009 1400   PROT 7.1 02/05/2018 1108   PROT 7.1 08/14/2009 1400   ALBUMIN 3.8 01/19/2018 1123   ALBUMIN 3.9 08/14/2009 1400   AST 21 02/05/2018 1108   AST 32 08/14/2009 1400   ALT 18 02/05/2018 1108   ALT 32 08/14/2009 1400   ALKPHOS 53 01/19/2018 1123   ALKPHOS 65 08/14/2009 1400   BILITOT 0.4 02/05/2018 1108   BILITOT 0.70 08/14/2009 1400   GFRNONAA 69 02/05/2018 1108   GFRAA 80 02/05/2018 1108    Diabetic Labs (most recent): Lab Results  Component Value Date   HGBA1C 10.4 (H) 01/19/2018   HGBA1C 9.3 (H) 12/19/2016   HGBA1C 11.3 09/07/2016     Lipid Panel ( most recent) Lipid Panel     Component Value Date/Time   CHOL 179 02/06/2018 1508   TRIG 259 (H) 02/06/2018 1508   HDL 44 (  L) 02/06/2018 1508   CHOLHDL 4.1 02/06/2018 1508   VLDL 49 (H) 06/30/2016 0944   LDLCALC 98 02/06/2018 1508      Results for ALAZAE, CRYMES (MRN [id number]) as of 03/13/2018 10:25  Ref. Range 12/19/2016 12:25 02/05/2018 11:08 02/06/2018 15:08  TSH Latest Ref Range: 0.40 - 4.50 mIU/L 2.23 0.31 (L)    Triiodothyronine,Free,Serum Latest Ref Range: 2.3 - 4.2 pg/mL 2.8    Triiodothyronine (T3) Latest Ref Range: 76 - 181 ng/dL   106  T4,Free(Direct) Latest Ref Range: 0.8 - 1.8 ng/dL 1.1  1.3      Assessment & Plan:   1. DM type 2 causing vascular disease ([place])  - DONYELL CARRELL has currently uncontrolled symptomatic type 2 DM since 67 years of age,  with most recent A1c of 10.4 %.   Recent labs reviewed. -She returns with improved glycemic profile on readjusted premixed insulin Novolin 70/30.  -her diabetes is complicated by retinopathy, obesity/sedentary life, and she remains at a high risk for more acute and chronic complications which include CAD, CVA, CKD, retinopathy, and neuropathy. These are all discussed in detail with her.  - I have counseled her on diet management and weight loss, by adopting a carbohydrate restricted/protein rich diet.  -  Suggestion is made for her to avoid simple carbohydrates  from her diet including Cakes, Sweet Desserts / Pastries, Ice Cream, Soda (diet and regular), Sweet Tea, Candies, Chips, Cookies, Store Bought Juices, Alcohol in Excess of  1-2 drinks a day, Artificial Sweeteners, and "Sugar-free" Products. This will help patient to have stable blood glucose profile and potentially avoid unintended weight gain.   - I encouraged her to switch to  unprocessed or minimally processed complex starch and increased protein intake (animal or plant source), fruits, and vegetables.  - she is advised to stick to a routine mealtimes to eat 3 meals  a day and avoid unnecessary snacks ( to snack only to correct hypoglycemia).   - she is following with Jearld Fenton, RDN, CDE for individualized diabetes education.  - I have approached her with the following individualized plan to manage diabetes and patient agrees:   -Based on her presentation with near target glycemic profile, she will be continued on her current insulin regimen  -She is advised to continue   Novolin 70/30  40 units daily with breakfast and 40 units daily with supper for pre-meal blood glucose readings above 90 mg/dL,  associated with strict monitoring of glucose 2 times a day-before breakfast and before supper and at any other time as needed.  - she is warned not to take insulin without proper monitoring per orders. - she is encouraged to call clinic for blood glucose levels less than 70 or above 300 mg /dl. - I will continue metformin 850 mg p.o. twice daily, therapeutically suitable for patient . -If her glycemic profile is not controlled optimally , she will be considered for basal/bolus insulin. - Patient specific target  A1c;  LDL, HDL, Triglycerides, and  Waist Circumference were discussed in detail.  2) BP/HTN:  her blood pressure is  controlled to target.   she is advised to continue her current medications including losartan 50 mg p.o. daily with breakfast . 3) Lipids/HPL:   Review of her recent lipid panel showed uncontrolled  LDL at 98.  she has documented adverse reactions to Lipitor.  She will be considered for low-dose Niaspan on next visit.    4)  Weight/Diet:  Body mass index is  36.56 kg/m.  - clearly complicating her diabetes care.  I discussed with her the fact that loss of 5 - 10% of her  current body weight will have the most impact on her diabetes management.  CDE Consult will be initiated . Exercise, and detailed carbohydrates information provided  -  detailed on discharge instructions.  5) hypothyroidism-postsurgical   -She gives a remote past history of thyroidectomy for reportedly benign goiter.  She will have surveillance thyroid/neck ultrasound.  Her most recent thyroid function tests are consistent with appropriate replacement.   She is advised to continue levothyroxine 100 mcg p.o. every morning for now.  - We discussed about correct intake of levothyroxine, at fasting, with water, separated by at least 30 minutes from breakfast, and separated by more  than 4 hours from calcium, iron, multivitamins, acid reflux medications (PPIs). -Patient is made aware of the fact that thyroid hormone replacement is needed for life, dose to be adjusted by periodic monitoring of thyroid function tests.  6) Chronic Care/Health Maintenance:  -she  is on ARB and Statin medications and  is encouraged to initiate and continue to follow up with Ophthalmology, Dentist,  Podiatrist at least yearly or according to recommendations, and advised to  stay away from smoking. I have recommended yearly flu vaccine and pneumonia vaccine at least every 5 years; moderate intensity exercise for up to 150 minutes weekly; and  sleep for at least 7 hours a day.  - I advised patient to maintain close follow up with Susy Frizzle, MD for primary care needs.  - Time spent with the patient: 25 min, of which >50% was spent in reviewing her blood glucose logs , discussing her hypo- and hyper-glycemic episodes, reviewing her current and  previous labs and insulin doses and developing a plan to avoid hypo- and hyper-glycemia. Please refer to Patient Instructions for Blood Glucose Monitoring and Insulin/Medications Dosing Guide"  in media tab for additional information. [name] participated in the discussions, expressed understanding, and voiced agreement with the above plans.  All questions were answered to her satisfaction. she is encouraged to contact clinic should she have any questions or concerns prior to her return visit.  Follow up plan: - Return in about 9 weeks (around 05/15/2018) for Meter, and Logs, Thyroid / Neck Ultrasound.  Glade Lloyd, MD [facility] [address] [place], [place] [address] Phone: [phone]  Fax: [phone]    03/13/2018, 10:23 AM  This note was partially dictated with voice recognition software. Similar sounding words can be transcribed inadequately or may not  be corrected upon  review.

## 2018-03-13 NOTE — Progress Notes (Signed)
  Medical Nutrition Therapy:  Appt start time: 1000 end time:  1030.   Assessment:  Primary concerns today: Dm Type 2. Walk in from Dr. Dorris Fetch. Will do safety questions at next visit..   Lab Results  Component Value Date   HGBA1C 10.4 (H) 01/19/2018   Wt Readings from Last 3 Encounters:  03/13/18 213 lb (96.6 kg)  03/02/18 210 lb 2 oz (95.3 kg)  02/27/18 205 lb (93 kg)   Ht Readings from Last 3 Encounters:  03/13/18 5\' 4"  (1.626 m)  03/02/18 5\' 4"  (1.626 m)  02/27/18 5\' 5"  (1.651 m)    CMP Latest Ref Rng & Units 02/05/2018 01/19/2018 01/02/2018  Glucose 65 - 139 mg/dL 215(H) 197(H) 167(H)  BUN 7 - 25 mg/dL 16 18 17   Creatinine 0.50 - 0.99 mg/dL 0.86 0.85 1.07(H)  Sodium 135 - 146 mmol/L 140 140 140  Potassium 3.5 - 5.3 mmol/L 4.3 3.9 3.8  Chloride 98 - 110 mmol/L 107 110 106  CO2 20 - 32 mmol/L 23 22 24   Calcium 8.6 - 10.4 mg/dL 10.5(H) 10.8(H) 10.9(H)  Total Protein 6.1 - 8.1 g/dL 7.1 7.7 7.4  Total Bilirubin 0.2 - 1.2 mg/dL 0.4 0.7 0.3  Alkaline Phos 38 - 126 U/L - 53 -  AST 10 - 35 U/L 21 33 23  ALT 6 - 29 U/L 18 28 16     Preferred Learning Style:    No preference indicated   Learning Readiness:     Ready  Change in progress   MEDICATIONS:    DIETARY INTAKE:    Eats 2 meals per day  Usual physical activity: ADL  Estimated energy needs: 1200  calories 135 g carbohydrates 90 g protein 33 g fat  Progress Towards Goal(s):  In progress.   Nutritional Diagnosis:  NB-1.1 Food and nutrition-related knowledge deficit As related to Diabetes Type 2.  As evidenced by A1c.    Intervention:  Nutrition and Diabetes education provided on My Plate, CHO counting, meal planning, portion sizes, timing of meals, avoiding snacks between meals unless having a low blood sugar, target ranges for A1C and blood sugars, signs/symptoms and treatment of hyper/hypoglycemia, monitoring blood sugars, taking medications as prescribed, benefits of exercising 30 minutes per day and  prevention of complications of DM. [name] Kitchen Goals 1 Follow My Plate 2. Eat three meals a day at timed discussed 3. Avoid snacks Cut out sodas, sweets and junk food Walk 15 minutes a day Test BS 4 times per day   Teaching Method Utilized:  Visual Auditory Hands on  Handouts given during visit include:  The Plate Method   Meal Plan Card   Barriers to learning/adherence to lifestyle change: none  Demonstrated degree of understanding via:  Teach Back   Monitoring/Evaluation:  Dietary intake, exercise, , and body weight in 2 week(s).

## 2018-03-13 NOTE — Patient Instructions (Signed)
Goals 1. Increase walking and start chair yoga 2. Increase fresh fruits and vegetables. 3. Drink more water Cut out diet sodas

## 2018-03-13 NOTE — Patient Instructions (Signed)

## 2018-03-13 NOTE — Progress Notes (Signed)
Diabetes Self-Management Education  Visit Type:  f/u  Appt. Start Time: 1050 Appt. End Time: 1210  03/13/2018  [name], identified by name and date of birth, is a 67 y.o. female with a diagnosis of Diabetes:  . Saw Dr. Dorris Fetch today. BS are running 100-200's.  Taking 40 units of 70/30 twice a day; before breakfast and before supper. Metformin 850 mg BID.  Hasn't been walking. Frustrated due to not losing weight and gaining weight. Not physically active due to back issues. Feeling better. Struggles to eat 3 meals per day.  Drinking more water. Cutting out other liquids . Trying not to snack between meals. BS are improving.   Patient lives with her husband.  Patient does the shopping and cooking.  She is retired from [employer].  ASSESSMENT  Wt Readings from Last 3 Encounters:  03/13/18 213 lb (96.6 kg)  03/02/18 210 lb 2 oz (95.3 kg)  02/27/18 205 lb (93 kg)   Ht Readings from Last 3 Encounters:  03/13/18 5\' 4"  (1.626 m)  03/02/18 5\' 4"  (1.626 m)  02/27/18 5\' 5"  (1.651 m)   There is no height or weight on file to calculate BMI.  CMP Latest Ref Rng & Units 02/05/2018 01/19/2018 01/02/2018  Glucose 65 - 139 mg/dL 215(H) 197(H) 167(H)  BUN 7 - 25 mg/dL 16 18 17   Creatinine 0.50 - 0.99 mg/dL 0.86 0.85 1.07(H)  Sodium 135 - 146 mmol/L 140 140 140  Potassium 3.5 - 5.3 mmol/L 4.3 3.9 3.8  Chloride 98 - 110 mmol/L 107 110 106  CO2 20 - 32 mmol/L 23 22 24   Calcium 8.6 - 10.4 mg/dL 10.5(H) 10.8(H) 10.9(H)  Total Protein 6.1 - 8.1 g/dL 7.1 7.7 7.4  Total Bilirubin 0.2 - 1.2 mg/dL 0.4 0.7 0.3  Alkaline Phos 38 - 126 U/L - 53 -  AST 10 - 35 U/L 21 33 23  ALT 6 - 29 U/L 18 28 16       Individualized Plan for Diabetes Self-Management Training:   Learning Objective:  Patient will have a greater understanding of diabetes self-management. Patient education plan is to attend individual and/or group sessions per assessed needs and concerns.   Plan:   Goals 1. Increase walking  and start chair yoga 2. Increase fresh fruits and vegetables. 3. Drink more water Cut out diet sodas   Expected Outcomes:     Education material provided: Living Well with Diabetes, Food label handouts, A1C conversion sheet, Meal plan card, My Plate and Snack sheet, breakfast ideas  If problems or questions, patient to contact team via:  Phone  Future DSME appointment:  3 months

## 2018-03-23 ENCOUNTER — Ambulatory Visit (HOSPITAL_COMMUNITY)
Admission: RE | Admit: 2018-03-23 | Discharge: 2018-03-23 | Disposition: A | Discharge status: Home / Self Care | Payer: Medicare HMO | Source: Ambulatory Visit | Attending: "Endocrinology | Admitting: "Endocrinology

## 2018-03-23 DIAGNOSIS — E041 Nontoxic single thyroid nodule: Secondary | ICD-10-CM | POA: Insufficient documentation

## 2018-03-23 DIAGNOSIS — E890 Postprocedural hypothyroidism: Secondary | ICD-10-CM | POA: Insufficient documentation

## 2018-04-02 ENCOUNTER — Encounter: Payer: Self-pay | Admitting: Podiatry

## 2018-04-09 ENCOUNTER — Ambulatory Visit: Payer: Medicare HMO | Admitting: Podiatry

## 2018-04-16 ENCOUNTER — Encounter: Payer: Self-pay | Admitting: Podiatry

## 2018-04-16 ENCOUNTER — Ambulatory Visit (INDEPENDENT_AMBULATORY_CARE_PROVIDER_SITE_OTHER): Payer: Medicare Other | Admitting: Podiatry

## 2018-04-16 ENCOUNTER — Telehealth: Payer: Self-pay | Admitting: *Deleted

## 2018-04-16 ENCOUNTER — Ambulatory Visit (INDEPENDENT_AMBULATORY_CARE_PROVIDER_SITE_OTHER): Payer: Medicare Other

## 2018-04-16 DIAGNOSIS — M79676 Pain in unspecified toe(s): Secondary | ICD-10-CM | POA: Diagnosis not present

## 2018-04-16 DIAGNOSIS — G629 Polyneuropathy, unspecified: Secondary | ICD-10-CM

## 2018-04-16 DIAGNOSIS — B351 Tinea unguium: Secondary | ICD-10-CM

## 2018-04-16 DIAGNOSIS — E0843 Diabetes mellitus due to underlying condition with diabetic autonomic (poly)neuropathy: Secondary | ICD-10-CM | POA: Diagnosis not present

## 2018-04-16 DIAGNOSIS — I739 Peripheral vascular disease, unspecified: Secondary | ICD-10-CM

## 2018-04-16 DIAGNOSIS — L600 Ingrowing nail: Secondary | ICD-10-CM

## 2018-04-16 NOTE — Telephone Encounter (Signed)
-----   Message from [name], DPM sent at 04/16/2018  4:13 PM EST ----- Can you order arterial studies and venous duplex? She has chronic swelling and discoloration and decreased pulses. Thanks.

## 2018-04-16 NOTE — Telephone Encounter (Signed)
Faxed orders to [facility]. 

## 2018-04-17 DIAGNOSIS — H16223 Keratoconjunctivitis sicca, not specified as Sjogren's, bilateral: Secondary | ICD-10-CM | POA: Diagnosis not present

## 2018-04-17 DIAGNOSIS — E119 Type 2 diabetes mellitus without complications: Secondary | ICD-10-CM | POA: Diagnosis not present

## 2018-04-17 DIAGNOSIS — Z961 Presence of intraocular lens: Secondary | ICD-10-CM | POA: Diagnosis not present

## 2018-04-17 DIAGNOSIS — H26492 Other secondary cataract, left eye: Secondary | ICD-10-CM | POA: Diagnosis not present

## 2018-04-17 DIAGNOSIS — H35372 Puckering of macula, left eye: Secondary | ICD-10-CM | POA: Diagnosis not present

## 2018-04-20 ENCOUNTER — Ambulatory Visit (HOSPITAL_COMMUNITY)
Admission: RE | Admit: 2018-04-20 | Discharge: 2018-04-20 | Disposition: A | Discharge status: Home / Self Care | Payer: Medicare Other | Source: Ambulatory Visit | Attending: Cardiovascular Disease | Admitting: Cardiovascular Disease

## 2018-04-20 ENCOUNTER — Encounter (HOSPITAL_COMMUNITY): Payer: Self-pay

## 2018-04-20 ENCOUNTER — Other Ambulatory Visit: Payer: Self-pay | Admitting: Podiatry

## 2018-04-20 DIAGNOSIS — M79606 Pain in leg, unspecified: Secondary | ICD-10-CM

## 2018-04-24 NOTE — Progress Notes (Signed)
Subjective: 68 y.o. returns the office today for painful, elongated, thickened toenails which she cannot trim herself.  S she states that she is doing well however she is having some discomfort.  Her feet.  She denies any recent injury or trauma.  She states that she is burning to her feet she cannot wear closed in shoe.  She also is getting swelling to her feet.  She gets some cramping to her legs at times.  Denies any redness or drainage around the nails. Denies any acute changes since last appointment and no new complaints today. Denies any systemic complaints such as fevers, chills, nausea, vomiting.   PCP: Susy Frizzle, MD  Objective: AAO 3, NAD DP/PT pulses palpable, CRT less than 3 seconds Nails hypertrophic, dystrophic, elongated, brittle, discolored 10. There is tenderness overlying the nails 1-5 bilaterally. There is no surrounding erythema or drainage along the nail sites.  Prominent metatarsal heads b/l. Subjectively there is tenderness sub metatarsal 1-5 bilaterally. General therapy there is no area of pinpoint tenderness there is some edema bilaterally. No open lesions or pre-ulcerative lesions are identified. No pain with calf compression, swelling, warmth, erythema.  Assessment: Patient presents with symptomatic onychomycosis; neuropathy  Plan: -Treatment options including alternatives, risks, complications were discussed -Nails sharply debrided 10 without complication/bleeding. -Most of her symptoms are coming from neuropathy.  She is already on medication for this.  She is on gabapentin. Check circulation studies.  I ordered her ABIs as well as venous duplex.  Also ordered a compound cream to help discomfort through [employer].  Also discussed physical therapy but we will await the results of the arterial studies.-Discussed daily foot inspection. If there are any changes, to call the office immediately.  -Follow-up in 3 months or sooner if any problems are to  arise. In the meantime, encouraged to call the office with any questions, concerns, changes symptoms.  Celesta Gentile, DPM

## 2018-04-25 DIAGNOSIS — E113292 Type 2 diabetes mellitus with mild nonproliferative diabetic retinopathy without macular edema, left eye: Secondary | ICD-10-CM | POA: Diagnosis not present

## 2018-04-25 DIAGNOSIS — Z961 Presence of intraocular lens: Secondary | ICD-10-CM | POA: Diagnosis not present

## 2018-04-25 DIAGNOSIS — H35372 Puckering of macula, left eye: Secondary | ICD-10-CM | POA: Diagnosis not present

## 2018-04-25 DIAGNOSIS — H26492 Other secondary cataract, left eye: Secondary | ICD-10-CM | POA: Diagnosis not present

## 2018-04-25 DIAGNOSIS — H43811 Vitreous degeneration, right eye: Secondary | ICD-10-CM | POA: Diagnosis not present

## 2018-04-26 ENCOUNTER — Telehealth: Payer: Self-pay | Admitting: *Deleted

## 2018-04-26 NOTE — Telephone Encounter (Signed)
-----   Message from [name], DPM sent at 04/24/2018  7:03 AM EST ----- Val- please let her know that the venous duplex was negative. Thanks.

## 2018-04-26 NOTE — Telephone Encounter (Signed)
Left message informing pt's husband, Everette states pt will be home after 4:00pm. I asked if I could call pt on the mobile phone, Everette states pt will not have mobile phone on. I told Everette I would call after 4:00pm today or tomorrow morning and he stated that will be fine.

## 2018-04-27 ENCOUNTER — Ambulatory Visit (HOSPITAL_COMMUNITY)
Admission: RE | Admit: 2018-04-27 | Discharge: 2018-04-27 | Disposition: A | Discharge status: Home / Self Care | Payer: Medicare Other | Source: Ambulatory Visit | Attending: Internal Medicine | Admitting: Internal Medicine

## 2018-04-27 DIAGNOSIS — I739 Peripheral vascular disease, unspecified: Secondary | ICD-10-CM

## 2018-05-01 ENCOUNTER — Telehealth: Payer: Self-pay | Admitting: *Deleted

## 2018-05-01 NOTE — Telephone Encounter (Signed)
-----   Message from [name], DPM sent at 04/30/2018  7:19 AM EST ----- Val- please let her know that the blood flow is normal. Thanks.

## 2018-05-01 NOTE — Telephone Encounter (Signed)
I informed pt of Dr. Leigh Aurora review of results.

## 2018-05-15 ENCOUNTER — Ambulatory Visit (INDEPENDENT_AMBULATORY_CARE_PROVIDER_SITE_OTHER): Payer: Medicare Other | Admitting: Family Medicine

## 2018-05-15 ENCOUNTER — Ambulatory Visit: Payer: Medicare HMO | Admitting: Nutrition

## 2018-05-15 ENCOUNTER — Ambulatory Visit: Payer: Medicare HMO | Admitting: "Endocrinology

## 2018-05-15 ENCOUNTER — Encounter: Payer: Self-pay | Admitting: Family Medicine

## 2018-05-15 VITALS — BP 164/88 | HR 70 | Temp 98.2°F | Resp 16 | Ht 65.0 in | Wt 209.0 lb

## 2018-05-15 DIAGNOSIS — H6123 Impacted cerumen, bilateral: Secondary | ICD-10-CM | POA: Diagnosis not present

## 2018-05-15 NOTE — Progress Notes (Signed)
Subjective:    Patient ID: [name], female    DOB: 1950/08/29, 68 y.o.   MRN: [id number]  HPI  Patient reports 2 days of hearing loss.  She denies any otalgia.  She denies any vertigo.  She denies any headaches or fever.  She denies any sinus pain.  Examination reveals bilateral cerumen impactions.  I completely remove the cerumen from her right auditory canal using alligator forcep.  I was able to remove 90% of the wax from her left ear using a curette.  After removing the wax, her hearing returned to normal/her baseline.  Her blood pressure is elevated however the patient admits she is not taking any of her blood pressure medication.  Compliance has been an issue with this patient.  She is also not following up with her endocrinologist.  Her recent thyroid ultrasound revealed a 2.9 cm mass in the left upper pole of her thyroid gland.  A needle biopsy was recommended.  However she missed her recent appointment with her endocrinologist to discuss this.  I strongly suggested and emphasized the need for her to be compliant with her medical doctors.  Eventually something bad is going to happen because the patient is not seriously addressing her diabetes or hypertension.  Current Outpatient Medications on File Prior to Visit  Medication Sig Dispense Refill  . acetaminophen (TYLENOL) 500 MG tablet Take 1,000 mg by mouth every 6 (six) hours as needed for moderate pain or headache.    [name] Kitchen amLODipine (NORVASC) 10 MG tablet Take 1 tablet (10 mg total) by mouth daily. 90 tablet 3  . aspirin 81 MG tablet Take 81 mg by mouth daily.    . Blood Glucose Calibration (TRUE METRIX LEVEL 2) Normal SOLN Use as directed 3 each 3  . Blood Glucose Monitoring Suppl (TRUE METRIX AIR GLUCOSE METER) w/Device KIT 1 kit by Does not apply route 3 (three) times daily. 1 kit 0  . esomeprazole (NEXIUM) 40 MG capsule Take 1 capsule (40 mg total) by mouth daily. 30 capsule 0  . fluticasone (FLONASE) 50 MCG/ACT nasal spray  Place 2 sprays into both nostrils daily. (Patient taking differently: Place 2 sprays into both nostrils daily as needed for allergies. ) 48 g 3  . gabapentin (NEURONTIN) 300 MG capsule TAKE 1 CAPSULE BY MOUTH THREE TIMES A DAY 270 capsule 2  . glucose blood (TRUE METRIX BLOOD GLUCOSE TEST) test strip Use as instructed 100 each 12  . ibuprofen (ADVIL,MOTRIN) 800 MG tablet Take 1 tablet (800 mg total) by mouth every 8 (eight) hours as needed. 30 tablet 0  . insulin NPH-regular Human (70-30) 100 UNIT/ML injection Inject 40 Units into the skin 2 (two) times daily before a meal.    . levothyroxine (SYNTHROID, LEVOTHROID) 100 MCG tablet Take 1 tablet (100 mcg total) by mouth daily. 90 tablet 3  . lidocaine (XYLOCAINE) 2 % solution Use as directed 15 mLs in the mouth or throat every 6 (six) hours as needed for mouth pain. 100 mL 0  . losartan (COZAAR) 50 MG tablet Take 1 tablet (50 mg total) by mouth daily. 90 tablet 3  . metFORMIN (GLUCOPHAGE) 850 MG tablet Take 1 tablet (850 mg total) by mouth 2 (two) times daily with a meal. (Patient taking differently: Take 850 mg by mouth as needed. ) 180 tablet 0  . mometasone (ELOCON) 0.1 % cream Apply 1 application topically daily. 45 g 0  . NON FORMULARY [facility]  PAIN CREAMS- #17    .  oxyCODONE (OXY IR/ROXICODONE) 5 MG immediate release tablet Take 1 tablet (5 mg total) by mouth every 6 (six) hours as needed for severe pain. (Patient not taking: Reported on 03/02/2018) 10 tablet 0  . polyethylene glycol (MIRALAX / GLYCOLAX) packet Take 17 g by mouth daily as needed for moderate constipation.    . TRUEPLUS LANCETS 33G MISC Use as directed 300 each 3   No current facility-administered medications on file prior to visit.     Allergies  Allergen Reactions  . Dilaudid [Hydromorphone Hcl] Nausea And Vomiting  . Lipitor [Atorvastatin] Other (See Comments)    Body & Muscle Aches  . Adhesive [Tape] Rash  . Ampicillin Rash and Other (See Comments)     REACTION: Rash and welts on her hands when she took it in the hospital  . Latex Itching, Dermatitis, Rash and Other (See Comments)    REACTION: Patient had a reaction to tape after surgery and they told her she was allergic to Latex 03/20/2012 "if it's on too long it will break me out"  . Penicillins Rash and Other (See Comments)    Has patient had a PCN reaction causing immediate rash, facial/tongue/throat swelling, SOB or lightheadedness with hypotension: No Has patient had a PCN reaction causing severe rash involving mucus membranes or skin necrosis: No Has patient had a PCN reaction that required hospitalization: Yes - in hospital receiving treatment Has patient had a PCN reaction occurring within the last 10 years: No If all of the above answers are "NO", then may proceed with Cephalosporin use.    Social History   Socioeconomic History  . Marital status: Married    Spouse name: Not on file  . Number of children: 1  . Years of education: Not on file  . Highest education level: Not on file  Occupational History  . Occupation: Retired since 2003  Social Needs  . Financial resource strain: Not on file  . Food insecurity:    Worry: Not on file    Inability: Not on file  . Transportation needs:    Medical: Not on file    Non-medical: Not on file  Tobacco Use  . Smoking status: Never Smoker  . Smokeless tobacco: Never Used  Substance and Sexual Activity  . Alcohol use: No  . Drug use: No  . Sexual activity: Not on file  Lifestyle  . Physical activity:    Days per week: Not on file    Minutes per session: Not on file  . Stress: Not on file  Relationships  . Social connections:    Talks on phone: Not on file    Gets together: Not on file    Attends religious service: Not on file    Active member of club or organization: Not on file    Attends meetings of clubs or organizations: Not on file    Relationship status: Not on file  . Intimate partner violence:    Fear of  current or ex partner: Not on file    Emotionally abused: Not on file    Physically abused: Not on file    Forced sexual activity: Not on file  Other Topics Concern  . Not on file  Social History Narrative  . Not on file   Review of Systems  All other systems reviewed and are negative.    Review of Systems  All other systems reviewed and are negative.      Objective:   Physical Exam  Constitutional: She is  oriented to person, place, and time. She appears well-developed and well-nourished. No distress.  Neck: Neck supple. No JVD present. No thyromegaly present.  Cardiovascular: Normal rate, regular rhythm and normal heart sounds. Exam reveals no gallop and no friction rub.  No murmur heard. Pulmonary/Chest: Effort normal and breath sounds normal. No respiratory distress. She has no wheezes. She has no rales. She exhibits no tenderness.  Abdominal: Soft. Bowel sounds are normal. She exhibits no distension and no mass. There is no abdominal tenderness. There is no rebound and no guarding.  Lymphadenopathy:    She has no cervical adenopathy.  Neurological: She is alert and oriented to person, place, and time.  Skin: She is not diaphoretic.  Vitals reviewed.  Bilateral cerumen impaction removed easily with forceps and curette       Assessment & Plan:  Bilateral cerumen impaction, medical noncompliance.  Cerumen impaction was removed easily using a combination of forceps and a curette.  Hearing return to the patient's baseline thereafter.  Encourage the patient to resume her blood pressure medication which has worked previously.  Also stressed the patient needs to be compliant with follow-up with her endocrinologist to better manage her diabetes and to follow-up on the nodule in her thyroid gland that requires a biopsy.  Patient states that she will schedule an appoint with endocrinologist to discuss further

## 2018-05-21 ENCOUNTER — Encounter: Payer: Self-pay | Admitting: Podiatry

## 2018-05-21 ENCOUNTER — Ambulatory Visit (INDEPENDENT_AMBULATORY_CARE_PROVIDER_SITE_OTHER): Payer: Medicare HMO | Admitting: Podiatry

## 2018-05-21 DIAGNOSIS — L989 Disorder of the skin and subcutaneous tissue, unspecified: Secondary | ICD-10-CM

## 2018-05-21 DIAGNOSIS — E0843 Diabetes mellitus due to underlying condition with diabetic autonomic (poly)neuropathy: Secondary | ICD-10-CM | POA: Diagnosis not present

## 2018-05-22 NOTE — Progress Notes (Signed)
Subjective: 68 year old female presents the office today for follow-up evaluation of neuropathy as well as discoloration to the skin on the right side.  She is been getting some swelling to both of her legs.  She states that because of the swelling she did take some of her husband's Lasix a couple of times but was not helpful.  She states that she has been tolerating the gabapentin well without any side effects and her symptoms have improved some.  She also presents to discuss arterial studies.   Denies any systemic complaints such as fevers, chills, nausea, vomiting. No acute changes since last appointment, and no other complaints at this time.   Objective: AAO x3, NAD DP/PT pulses palpable bilaterally, CRT less than 3 seconds Overall there is no area pinpoint tenderness identified bilaterally.  There is chronic bilateral lower extremity edema which is pitting.  There is some hyperpigmentation at the medial ankle which is been a chronic issue as well there is no skin breakdown identified.  This appears to be a hemosiderin pigmented area.  No open lesions or pre-ulcerative lesions.  No pain with calf compression, swelling, warmth, erythema  Assessment: 68 year old female with neuropathy; bilateral lower extremity edema  Plan: -All treatment options discussed with the patient including all alternatives, risks, complications.  -We discussed venous reflux study and I encouraged compression socks.  However also on her follow-up with her primary care physician in regards to the pitting edema bilaterally.  Advised her not to take her husband's Lasix or other medications and Lasix prescribed for her. -Patient encouraged to call the office with any questions, concerns, change in symptoms.    [name] DPM

## 2018-05-25 IMAGING — MR MR CERVICAL SPINE WO/W CM
4 of 8 series · 21 of 48 positions shown · IV contrast (20ml multihance)
Comparison: 07/23/2003

CLINICAL DATA: Neck and back pain with right arm pain weakness over
the past 3 years. History of cervical spine surgery. Diffuse
idiopathic skeletal hyperostosis. Right sided weakness.

EXAM:
MRI CERVICAL SPINE WITHOUT AND WITH CONTRAST
TECHNIQUE: Multiplanar and multiecho pulse sequences of the cervical spine, to
include the craniocervical junction and cervicothoracic junction,
were obtained without and with intravenous contrast.
CONTRAST:  20mL MULTIHANCE GADOBENATE DIMEGLUMINE 529 MG/ML IV SOLN

[Series 4: T1 · sagittal · 3.0mm · 0.41mm/px · 3 of 13 slices shown]
[im 1/13]
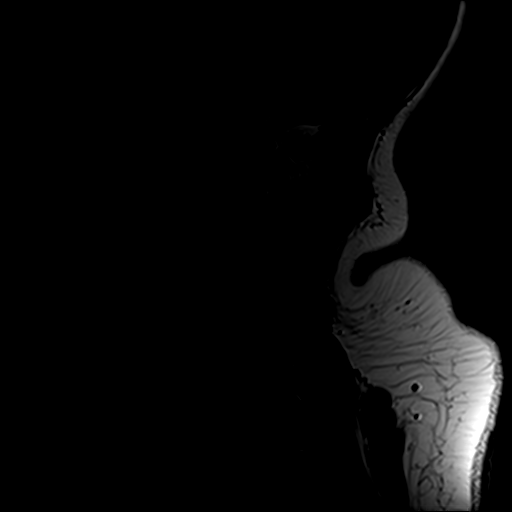
[im 9/13]
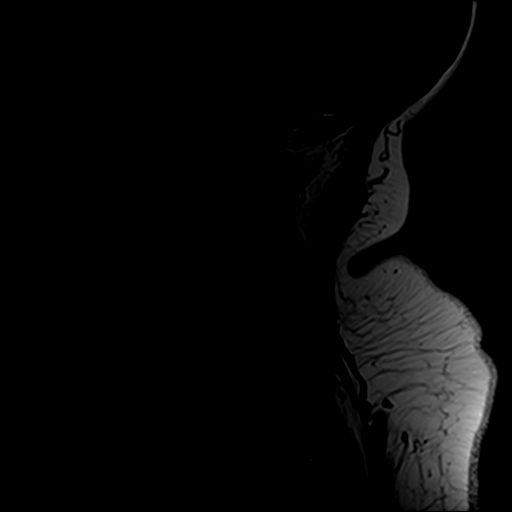
[im 13/13]
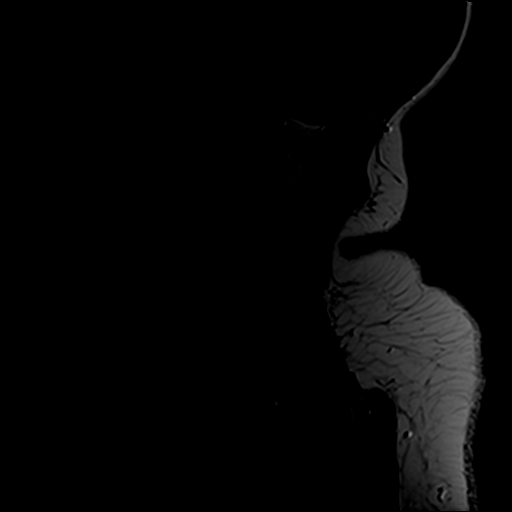

[Series 5: T2 · axial · 3.0mm · 0.39mm/px · z∈[-35,+66]mm · 8 of 28 slices shown (1 of 3)]
[im 1/28]
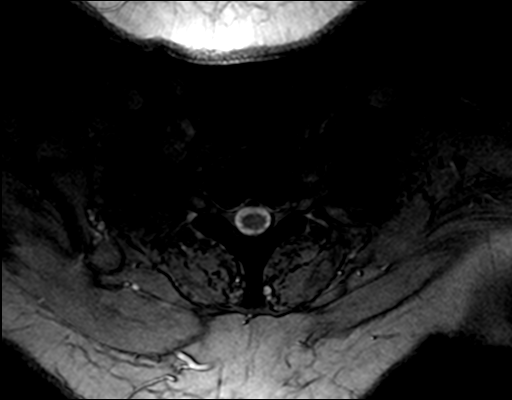
[im 4/28]
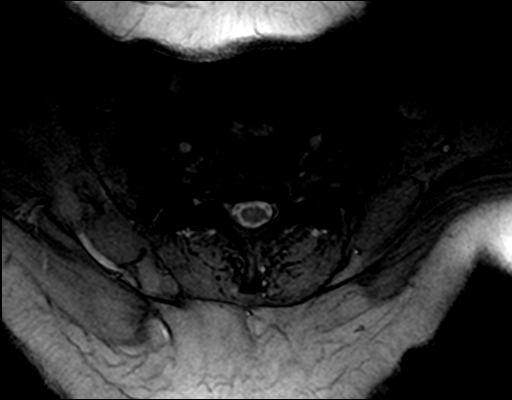
[im 8/28]
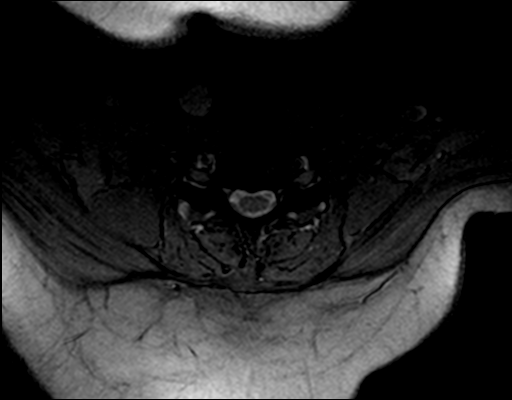
[im 12/28]
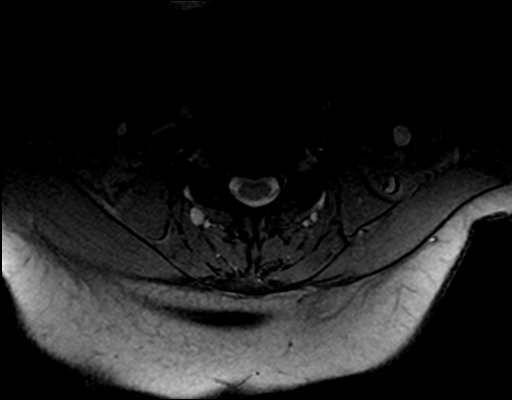
[im 16/28]
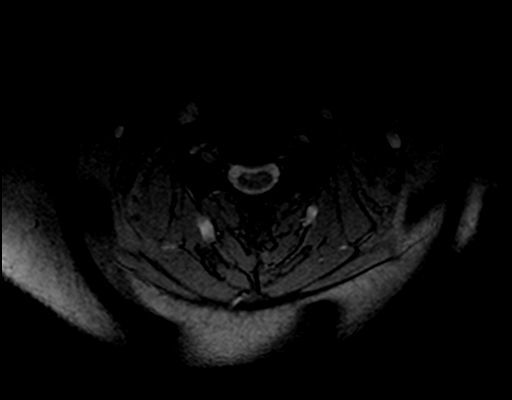
[im 20/28]
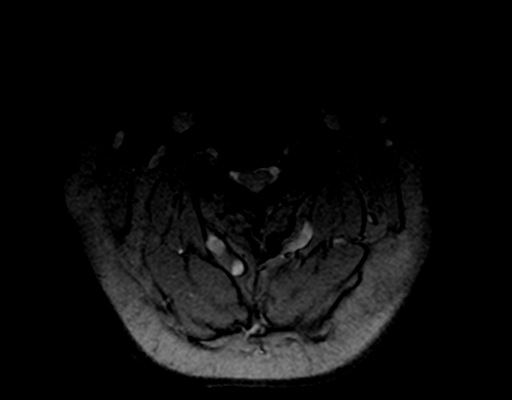
[im 24/28]
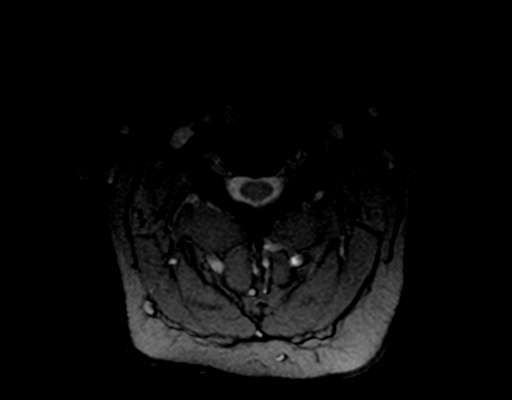
[im 28/28]
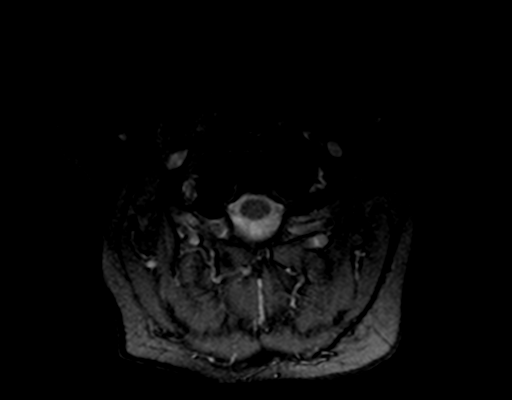

[Series 6: T2 · axial · 3.0mm · 0.39mm/px · z∈[-36,+51]mm · 7 of 28 slices shown (2 of 3)]
[im 1/28]
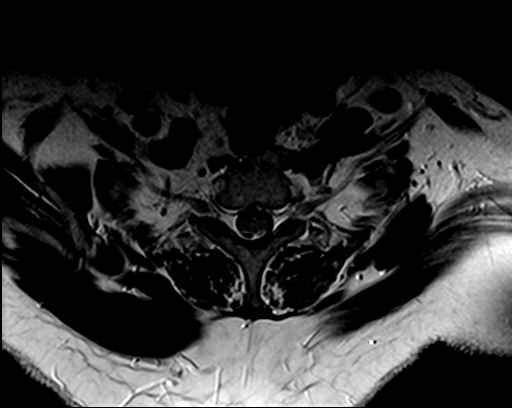
[im 4/28]
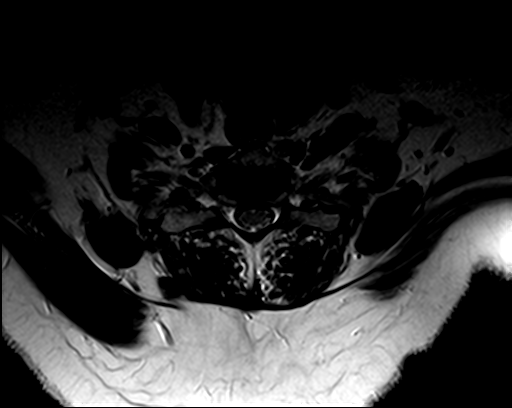
[im 8/28]
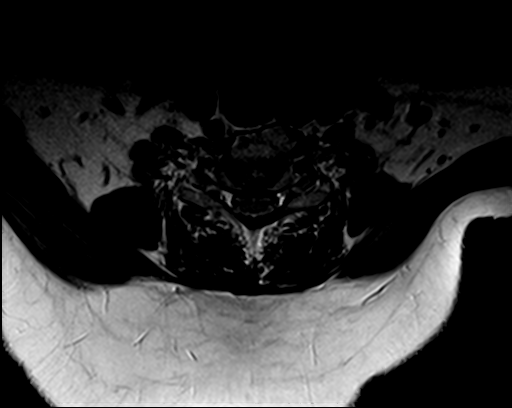
[im 12/28]
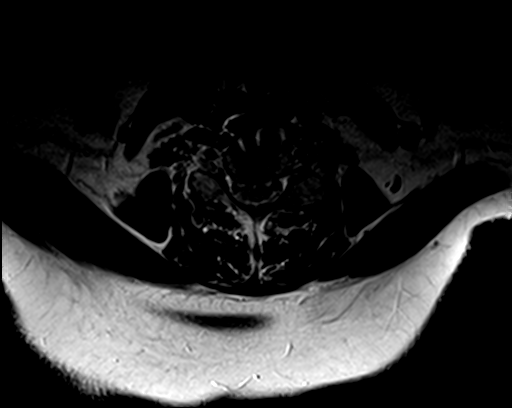
[im 16/28]
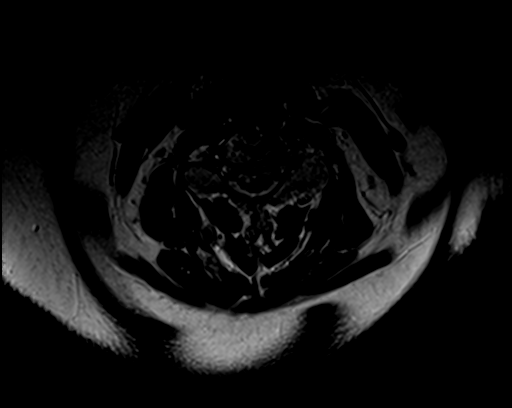
[im 20/28]
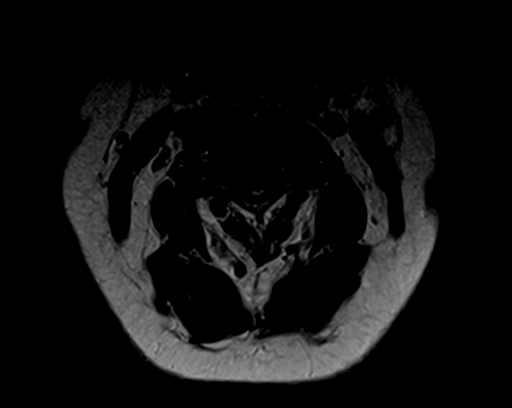
[im 24/28]
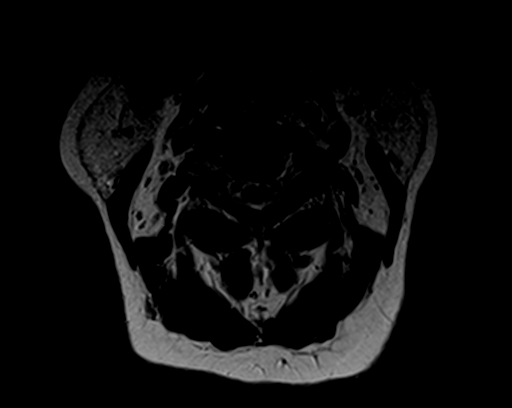

[Series 8: T2 · sagittal · 3.0mm · 0.41mm/px · 3 of 13 slices shown (3 of 3)]
[im 1/13]
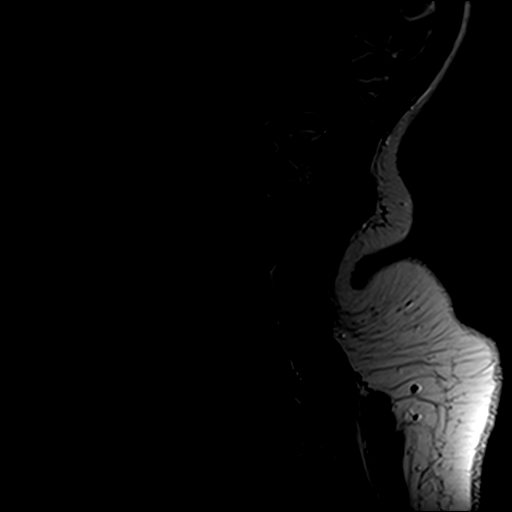
[im 9/13]
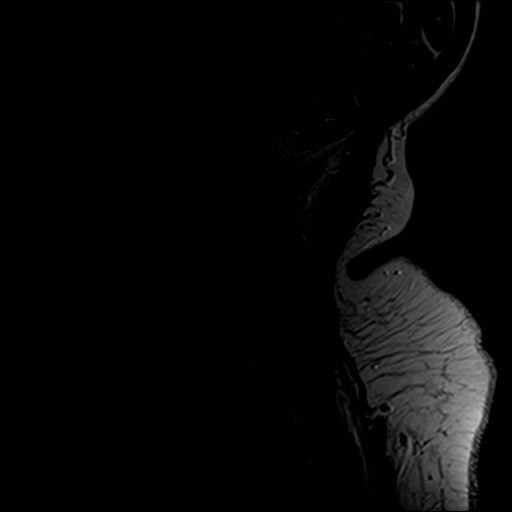
[im 13/13]
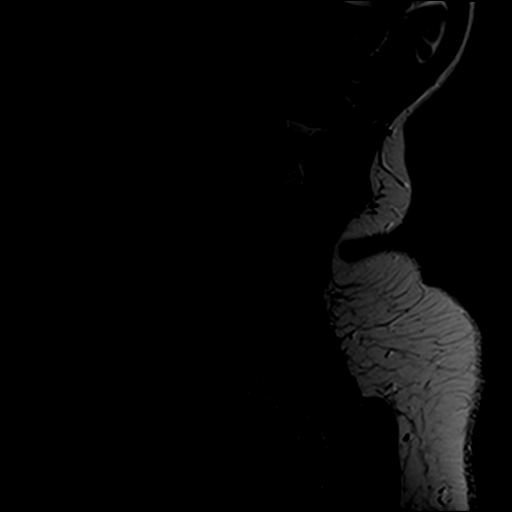

[21 of 48 positions shown; findings below may reference images not displayed]

FINDINGS: Alignment: 1.5 mm degenerative retrolisthesis at C3-4.

Vertebrae: ACDF at C4-5 with solid interbody fusion. There appears
to be solid bridging anterior osteophyte at C5-6, C6-7, and C7-T1.
Mild degenerative facet edema on the right at C3-4

Cord: 0.5 by 0.2 by 0.2 cm abnormal focus of increased T2 signal in
the right anterior cervical cord at the C4-5 level, without abnormal
enhancement I do not see this lesion on the prior exam from 1559. No
other cord lesions or abnormal cord enhancement identified.

Posterior Fossa, vertebral arteries, paraspinal tissues: Right
thyroidectomy. Otherwise unremarkable.

Disc levels:

C2-3: Mild central narrowing of the thecal sac and mild bilateral
foraminal stenosis due to left eccentric disc bulge, facet
arthropathy, and uncinate spurring .

C3-4: Prominent right and moderate left foraminal stenosis with
moderate central narrowing of the thecal sac due to disc bulge,
right greater than left uncinate spurring, and facet arthropathy.
Significantly worsened from prior at this level, possibly
attributable to adjacent segment disease.

C4-5:  No impingement.  Fused level.

C5-6: Borderline bilateral foraminal stenosis due to facet
arthropathy.

C6-7: Mild left and borderline right foraminal stenosis due to facet
and uncinate spurring.

C7-T1: Borderline left foraminal stenosis due to facet and uncinate
spurring.

T1-2: No impingement. Minimal disc bulge. This level is only
included on the parasagittal images.

T2-3: Mild central narrowing of the thecal sac due to central disc
protrusion. This level is only included on the parasagittal images.
IMPRESSION: 1. Cervical spondylosis and degenerative disc disease causing
prominent impingement at C3-4 ; and mild impingement at C2-3, C6-7,
and T2- 3, as detailed above.
2. Prior fusion at the C4-5 level. DISH causes flowing fused
anterior osteophytes at C5-C6-C7-T1.
3. A 5 by 2 by 2 mm abnormal focus of increased T2 signal in the
right anterior cervical cord at C4-5, probably from myelomalacia. No
abnormal enhancement in this vicinity.

## 2018-06-01 DIAGNOSIS — E890 Postprocedural hypothyroidism: Secondary | ICD-10-CM | POA: Diagnosis not present

## 2018-06-01 DIAGNOSIS — E1159 Type 2 diabetes mellitus with other circulatory complications: Secondary | ICD-10-CM | POA: Diagnosis not present

## 2018-06-02 LAB — HEMOGLOBIN A1C
Hgb A1c MFr Bld: 9.8 % of total Hgb — ABNORMAL HIGH (ref <5.7)
Mean Plasma Glucose: 235 (calc)
eAG (mmol/L): 13 (calc)

## 2018-06-02 LAB — COMPLETE METABOLIC PANEL WITH GFR
AG Ratio: 1.1 (calc) (ref 1.0–2.5)
ALT: 15 U/L (ref 6–29)
AST: 15 U/L (ref 10–35)
Albumin: 3.6 g/dL (ref 3.6–5.1)
Alkaline phosphatase (APISO): 57 U/L (ref 37–153)
BUN/Creatinine Ratio: 20 (calc) (ref 6–22)
BUN: 24 mg/dL (ref 7–25)
CO2: 25 mmol/L (ref 20–32)
Calcium: 10.8 mg/dL — ABNORMAL HIGH (ref 8.6–10.4)
Chloride: 106 mmol/L (ref 98–110)
Creat: 1.22 mg/dL — ABNORMAL HIGH (ref 0.50–0.99)
GFR, Est African American: 52 mL/min/{1.73_m2} — ABNORMAL LOW (ref >=60)
GFR, Est Non African American: 45 mL/min/{1.73_m2} — ABNORMAL LOW (ref >=60)
Globulin: 3.2 g/dL (calc) (ref 1.9–3.7)
Glucose, Bld: 216 mg/dL — ABNORMAL HIGH (ref 65–99)
Potassium: 4.6 mmol/L (ref 3.5–5.3)
Sodium: 138 mmol/L (ref 135–146)
Total Bilirubin: 0.3 mg/dL (ref 0.2–1.2)
Total Protein: 6.8 g/dL (ref 6.1–8.1)

## 2018-06-02 LAB — LIPID PANEL
CHOL/HDL RATIO: 4.3 (calc) (ref <5.0)
Cholesterol: 184 mg/dL (ref <200)
HDL: 43 mg/dL — ABNORMAL LOW (ref >=50)
LDL Cholesterol (Calc): 96 mg/dL (calc)
Non-HDL Cholesterol (Calc): 141 mg/dL (calc) — ABNORMAL HIGH (ref <130)
TRIGLYCERIDES: 338 mg/dL — AB (ref <150)

## 2018-06-02 LAB — MICROALBUMIN / CREATININE URINE RATIO
Creatinine, Urine: 126 mg/dL (ref 20–275)
Microalb Creat Ratio: 1498 mcg/mg creat — ABNORMAL HIGH (ref <30)
Microalb, Ur: 188.7 mg/dL

## 2018-06-02 LAB — T4, FREE: Free T4: 1.1 ng/dL (ref 0.8–1.8)

## 2018-06-02 LAB — VITAMIN D 25 HYDROXY (VIT D DEFICIENCY, FRACTURES): Vit D, 25-Hydroxy: 17 ng/mL — ABNORMAL LOW (ref 30–100)

## 2018-06-02 LAB — TSH: TSH: 1.37 mIU/L (ref 0.40–4.50)

## 2018-06-04 ENCOUNTER — Ambulatory Visit: Payer: Medicare Other | Admitting: Nutrition

## 2018-06-04 ENCOUNTER — Ambulatory Visit: Payer: Medicare Other | Admitting: "Endocrinology

## 2018-06-11 ENCOUNTER — Encounter: Payer: Self-pay | Admitting: "Endocrinology

## 2018-06-11 ENCOUNTER — Ambulatory Visit (INDEPENDENT_AMBULATORY_CARE_PROVIDER_SITE_OTHER): Payer: Medicare Other | Admitting: "Endocrinology

## 2018-06-11 VITALS — BP 157/101 | HR 67 | Resp 12 | Ht 64.0 in | Wt 210.2 lb

## 2018-06-11 DIAGNOSIS — E890 Postprocedural hypothyroidism: Secondary | ICD-10-CM | POA: Diagnosis not present

## 2018-06-11 DIAGNOSIS — E559 Vitamin D deficiency, unspecified: Secondary | ICD-10-CM

## 2018-06-11 DIAGNOSIS — E042 Nontoxic multinodular goiter: Secondary | ICD-10-CM

## 2018-06-11 DIAGNOSIS — E1159 Type 2 diabetes mellitus with other circulatory complications: Secondary | ICD-10-CM | POA: Diagnosis not present

## 2018-06-11 DIAGNOSIS — I10 Essential (primary) hypertension: Secondary | ICD-10-CM | POA: Diagnosis not present

## 2018-06-11 MED ORDER — VITAMIN D3 125 MCG (5000 UT) PO CAPS: 5000.0000 [IU] | ORAL_CAPSULE | Freq: Every day | ORAL | 0 refills | Status: DC

## 2018-06-11 NOTE — Patient Instructions (Signed)

## 2018-06-11 NOTE — Progress Notes (Signed)
Endocrinology follow-up Note       06/11/2018, 12:19 PM   Subjective:    Patient ID: [name], female    DOB: [birth year].  [name] is being seen in follow-up for management of currently uncontrolled,complicated symptomatic type 2 diabetes and associated hypertension, hypothyroidism. PMD :  Susy Frizzle, MD.   Past Medical History:  Diagnosis Date  . Anginal pain ([place]) 03/19/2012   saw Dr. Cathie Olden .. she thinks its more related to stomach issues  . Arthritis    "all over; mostly in my back" (03/20/2012)  . Breast cancer ([place])    "right" (03/20/2012)  . Cardiomyopathy   . Chest pain 06/2005   Hospitalized, dystolic dysfunction  . Chronic lower back pain    "have 2 herniated discs; going to have to have a fusion" (03/20/2012)  . Family history of anesthesia complication    "father has nausea and vomiting"  . Gallstones   . GERD (gastroesophageal reflux disease)   . H/O hiatal hernia   . History of kidney stones   . History of right mastectomy   . Hypertension    "patient states never had HTN, takes Hyzaar for heart  . Hypothyroidism   . Irritable bowel syndrome   . Migraines    "it's been a long time since I've had one" (03/20/2012)  . Osteoarthritis   . Peripheral neuropathy   . PONV (postoperative nausea and vomiting)   . Sciatica   . Type II diabetes mellitus ([place])    Past Surgical History:  Procedure Laterality Date  . ABDOMINAL HYSTERECTOMY  1993  . adenosin cardiolite  07/2005   (+) wall motion abnormality  . AXILLARY LYMPH NODE DISSECTION  2003   squamous cell cancer  . BACK SURGERY    . CARDIAC CATHETERIZATION  ? 2005 / 2007  . CHOLECYSTECTOMY N/A 01/24/2018   Procedure: LAPAROSCOPIC CHOLECYSTECTOMY;  Surgeon: Erroll Luna, MD;  Location: [facility];  Service: General;  Laterality: N/A;  . CT abdomen and pelvis  02/2010   Same  . ESOPHAGOGASTRODUODENOSCOPY   2005   GERD  . KNEE ARTHROSCOPY  1990's   "right" (03/20/2012)  . LUMBAR FUSION N/A 08/22/2014   Procedure: Right L2-3 and right L1-2 transforaminal lumbar interbody fusion with pedicle screws, rods, sleeves, cages, local bone graft, Vivigen, cancellous chips;  Surgeon: Jessy Oto, MD;  Location: [facility];  Service: Orthopedics;  Laterality: N/A;  . LUMBAR LAMINECTOMY/DECOMPRESSION MICRODISCECTOMY N/A 02/04/2014   Procedure: RIGHT L2-3 MICRODISCECTOMY ;  Surgeon: Jessy Oto, MD;  Location: [facility];  Service: Orthopedics;  Laterality: N/A;  . MASTECTOMY WITH AXILLARY LYMPH NODE DISSECTION  12/2003   "right" (03/20/2012)  . POSTERIOR CERVICAL FUSION/FORAMINOTOMY  2001  . SHOULDER ARTHROSCOPY W/ ROTATOR CUFF REPAIR  2003   Left  . THYROIDECTOMY  1977   Right  . TOTAL KNEE ARTHROPLASTY  2000   Right  . [place] of abdomen  07/2005   Fatty liver / kidney stones   Social History   Socioeconomic History  . Marital status: Married    Spouse name: Not on file  . Number of children:  1  . Years of education: Not on file  . Highest education level: Not on file  Occupational History  . Occupation: Retired since 2003  Social Needs  . Financial resource strain: Not on file  . Food insecurity:    Worry: Not on file    Inability: Not on file  . Transportation needs:    Medical: Not on file    Non-medical: Not on file  Tobacco Use  . Smoking status: Never Smoker  . Smokeless tobacco: Never Used  Substance and Sexual Activity  . Alcohol use: No  . Drug use: No  . Sexual activity: Not on file  Lifestyle  . Physical activity:    Days per week: Not on file    Minutes per session: Not on file  . Stress: Not on file  Relationships  . Social connections:    Talks on phone: Not on file    Gets together: Not on file    Attends religious service: Not on file    Active member of club or organization: Not on file    Attends meetings of clubs or organizations: Not on file    Relationship status:  Not on file  Other Topics Concern  . Not on file  Social History Narrative  . Not on file   Outpatient Encounter Medications as of 06/11/2018  Medication Sig  . acetaminophen (TYLENOL) 500 MG tablet Take 1,000 mg by mouth every 6 (six) hours as needed for moderate pain or headache.  [name] Kitchen amLODipine (NORVASC) 10 MG tablet Take 1 tablet (10 mg total) by mouth daily.  [name] Kitchen aspirin 81 MG tablet Take 81 mg by mouth daily.  . Blood Glucose Calibration (TRUE METRIX LEVEL 2) Normal SOLN Use as directed  . Blood Glucose Monitoring Suppl (TRUE METRIX AIR GLUCOSE METER) w/Device KIT 1 kit by Does not apply route 3 (three) times daily.  [name] Kitchen esomeprazole (NEXIUM) 40 MG capsule Take 1 capsule (40 mg total) by mouth daily.  . fluticasone (FLONASE) 50 MCG/ACT nasal spray Place 2 sprays into both nostrils daily. (Patient taking differently: Place 2 sprays into both nostrils daily as needed for allergies. )  . gabapentin (NEURONTIN) 300 MG capsule TAKE 1 CAPSULE BY MOUTH THREE TIMES A DAY  . glucose blood (TRUE METRIX BLOOD GLUCOSE TEST) test strip Use as instructed  . ibuprofen (ADVIL,MOTRIN) 800 MG tablet Take 1 tablet (800 mg total) by mouth every 8 (eight) hours as needed.  . insulin NPH-regular Human (70-30) 100 UNIT/ML injection Inject 50 Units into the skin 2 (two) times daily before a meal.  . levothyroxine (SYNTHROID, LEVOTHROID) 100 MCG tablet Take 1 tablet (100 mcg total) by mouth daily.  [name] Kitchen lidocaine (XYLOCAINE) 2 % solution Use as directed 15 mLs in the mouth or throat every 6 (six) hours as needed for mouth pain.  [name] Kitchen losartan (COZAAR) 50 MG tablet Take 1 tablet (50 mg total) by mouth daily.  . metFORMIN (GLUCOPHAGE) 850 MG tablet Take 1 tablet (850 mg total) by mouth 2 (two) times daily with a meal. (Patient taking differently: Take 850 mg by mouth as needed. )  . mometasone (ELOCON) 0.1 % cream Apply 1 application topically daily.  . NON FORMULARY [facility]  PAIN CREAMS- #17  . oxyCODONE (OXY  IR/ROXICODONE) 5 MG immediate release tablet Take 1 tablet (5 mg total) by mouth every 6 (six) hours as needed for severe pain.  . polyethylene glycol (MIRALAX / GLYCOLAX) packet Take 17 g by mouth daily as needed  for moderate constipation.  . TRUEPLUS LANCETS 33G MISC Use as directed  . Cholecalciferol (VITAMIN D3) 125 MCG (5000 UT) CAPS Take 1 capsule (5,000 Units total) by mouth daily.   No facility-administered encounter medications on file as of 06/11/2018.     ALLERGIES: Allergies  Allergen Reactions  . Dilaudid [Hydromorphone Hcl] Nausea And Vomiting  . Lipitor [Atorvastatin] Other (See Comments)    Body & Muscle Aches  . Adhesive [Tape] Rash  . Ampicillin Rash and Other (See Comments)    REACTION: Rash and welts on her hands when she took it in the hospital  . Latex Itching, Dermatitis, Rash and Other (See Comments)    REACTION: Patient had a reaction to tape after surgery and they told her she was allergic to Latex 03/20/2012 "if it's on too long it will break me out"  . Penicillins Rash and Other (See Comments)    Has patient had a PCN reaction causing immediate rash, facial/tongue/throat swelling, SOB or lightheadedness with hypotension: No Has patient had a PCN reaction causing severe rash involving mucus membranes or skin necrosis: No Has patient had a PCN reaction that required hospitalization: Yes - in hospital receiving treatment Has patient had a PCN reaction occurring within the last 10 years: No If all of the above answers are "NO", then may proceed with Cephalosporin use.     VACCINATION STATUS: Immunization History  Administered Date(s) Administered  . Influenza Split 01/31/2011  . Influenza Whole 02/23/2007  . Influenza, High Dose Seasonal PF 01/10/2017  . Influenza,inj,Quad PF,6+ Mos 01/07/2014, 02/09/2016, 01/01/2018  . Influenza-Unspecified 01/10/2012  . Pneumococcal Polysaccharide-23 08/25/2014  . Tdap 04/20/2011    Diabetes  She presents for her  follow-up diabetic visit. She has type 2 diabetes mellitus. Onset time: She was diagnosed at approximate age of 9 years. Her disease course has been fluctuating. There are no hypoglycemic associated symptoms. Pertinent negatives for hypoglycemia include no confusion, headaches, pallor or seizures. Associated symptoms include blurred vision, foot ulcerations, polydipsia and polyuria. Pertinent negatives for diabetes include no chest pain, no fatigue and no polyphagia. There are no hypoglycemic complications. Symptoms are worsening. Diabetic complications include retinopathy. Risk factors for coronary artery disease include diabetes mellitus, hypertension, obesity, sedentary lifestyle and post-menopausal. Current diabetic treatment includes insulin injections (She is currently on Novolin 70/30 65 units a.m., 35 units p.m. along with metformin 850 mg p.o. twice daily.). Her weight is fluctuating minimally. She is following a generally unhealthy diet. When asked about meal planning, she reported none. She has not had a previous visit with a dietitian. She never participates in exercise. Her breakfast blood glucose range is generally 180-200 mg/dl. Her dinner blood glucose range is generally 180-200 mg/dl. Her overall blood glucose range is 180-200 mg/dl. (She returns with slight improvement in her A1c to 9.8%, she missed at least a third of her insulin injection opportunities.  She denies any hypoglycemic episodes.    ) An ACE inhibitor/angiotensin II receptor blocker is being taken. Eye exam is current.  Hypertension  This is a chronic problem. The current episode started more than 1 year ago. The problem is uncontrolled. Associated symptoms include blurred vision. Pertinent negatives include no chest pain, headaches, palpitations or shortness of breath. Risk factors for coronary artery disease include diabetes mellitus, obesity, sedentary lifestyle, dyslipidemia and family history. Past treatments include  angiotensin blockers. Hypertensive end-organ damage includes retinopathy. Identifiable causes of hypertension include a thyroid problem.  Thyroid Problem  Presents for follow-up visit. Onset time: She  underwent what appears to be total thyroidectomy in her 4s for reportedly benign goiter.  Has taken thyroid hormone ever since, currently levothyroxine 100 mcg p.o. nightly. Patient reports no cold intolerance, diarrhea, fatigue, heat intolerance or palpitations. The symptoms have been stable. Past treatments include levothyroxine. Prior procedures include thyroidectomy.    Review of Systems  Constitutional: Negative for chills, fatigue, fever and unexpected weight change.  HENT: Negative for trouble swallowing and voice change.   Eyes: Positive for blurred vision. Negative for visual disturbance.  Respiratory: Negative for cough, shortness of breath and wheezing.   Cardiovascular: Negative for chest pain, palpitations and leg swelling.  Gastrointestinal: Negative for diarrhea, nausea and vomiting.  Endocrine: Positive for polydipsia and polyuria. Negative for cold intolerance, heat intolerance and polyphagia.  Musculoskeletal: Negative for arthralgias and myalgias.  Skin: Negative for color change, pallor, rash and wound.  Neurological: Negative for seizures and headaches.  Psychiatric/Behavioral: Negative for confusion and suicidal ideas.    Objective:    BP (!) 157/101   Pulse 67   Resp 12   Ht '5\' 4"'  (1.626 m)   Wt 210 lb 3.2 oz (95.3 kg)   SpO2 94% Comment: room air  BMI 36.08 kg/m   Wt Readings from Last 3 Encounters:  06/11/18 210 lb 3.2 oz (95.3 kg)  05/15/18 209 lb (94.8 kg)  03/13/18 213 lb (96.6 kg)     Physical Exam Constitutional:      Appearance: She is well-developed.  HENT:     Head: Normocephalic and atraumatic.  Neck:     Musculoskeletal: Normal range of motion and neck supple.     Thyroid: No thyromegaly.     Trachea: No tracheal deviation.     Comments:  Post thyroidectomy scar on anterior lower neck.  Palpable thyroid. Cardiovascular:     Rate and Rhythm: Normal rate and regular rhythm.  Pulmonary:     Effort: Pulmonary effort is normal.     Breath sounds: Normal breath sounds.  Abdominal:     General: Bowel sounds are normal.     Palpations: Abdomen is soft.     Tenderness: There is no abdominal tenderness. There is no guarding.  Musculoskeletal: Normal range of motion.  Skin:    General: Skin is warm and dry.     Coloration: Skin is not pale.     Findings: No erythema or rash.  Neurological:     Mental Status: She is alert and oriented to person, place, and time.     Cranial Nerves: No cranial nerve deficit.     Coordination: Coordination normal.     Deep Tendon Reflexes: Reflexes are normal and symmetric.  Psychiatric:        Judgment: Judgment normal.     CMP ( most recent) CMP     Component Value Date/Time   NA 138 06/01/2018 1525   NA 135 08/14/2009 1400   K 4.6 06/01/2018 1525   K 4.1 08/14/2009 1400   CL 106 06/01/2018 1525   CL 99 08/14/2009 1400   CO2 25 06/01/2018 1525   CO2 27 08/14/2009 1400   GLUCOSE 216 (H) 06/01/2018 1525   GLUCOSE 235 (H) 08/14/2009 1400   BUN 24 06/01/2018 1525   BUN 11 08/14/2009 1400   CREATININE 1.22 (H) 06/01/2018 1525   CALCIUM 10.8 (H) 06/01/2018 1525   CALCIUM 10.2 08/14/2009 1400   PROT 6.8 06/01/2018 1525   PROT 7.1 08/14/2009 1400   ALBUMIN 3.8 01/19/2018 1123   ALBUMIN 3.9  08/14/2009 1400   AST 15 06/01/2018 1525   AST 32 08/14/2009 1400   ALT 15 06/01/2018 1525   ALT 32 08/14/2009 1400   ALKPHOS 53 01/19/2018 1123   ALKPHOS 65 08/14/2009 1400   BILITOT 0.3 06/01/2018 1525   BILITOT 0.70 08/14/2009 1400   GFRNONAA 45 (L) 06/01/2018 1525   GFRAA 52 (L) 06/01/2018 1525    Diabetic Labs (most recent): Lab Results  Component Value Date   HGBA1C 9.8 (H) 06/01/2018   HGBA1C 10.4 (H) 01/19/2018   HGBA1C 9.3 (H) 12/19/2016     Lipid Panel ( most recent) Lipid  Panel     Component Value Date/Time   CHOL 184 06/01/2018 1525   TRIG 338 (H) 06/01/2018 1525   HDL 43 (L) 06/01/2018 1525   CHOLHDL 4.3 06/01/2018 1525   VLDL 49 (H) 06/30/2016 0944   LDLCALC 96 06/01/2018 1525    Results for SEBA, MADOLE (MRN [id number]) as of 06/11/2018 11:57  Ref. Range 02/06/2018 15:08 06/01/2018 15:25  TSH Latest Ref Range: 0.40 - 4.50 mIU/L  1.37  Triiodothyronine (T3) Latest Ref Range: 76 - 181 ng/dL 106   T4,Free(Direct) Latest Ref Range: 0.8 - 1.8 ng/dL 1.3 1.1     Assessment & Plan:   1. DM type 2 causing vascular disease ([place])  - AKELIA HUSTED has currently uncontrolled symptomatic type 2 DM since 68 years of age,  with most recent A1c of 9.8%.     Recent labs reviewed. -She returns with likely improved glycemic profile, no documented or reported hypoglycemia.  She remains unengaged, missed a third of her insulin injection opportunities.    -her diabetes is complicated by retinopathy, obesity/sedentary life, and she remains at a high risk for more acute and chronic complications which include CAD, CVA, CKD, retinopathy, and neuropathy. These are all discussed in detail with her.  - I have counseled her on diet management and weight loss, by adopting a carbohydrate restricted/protein rich diet.  - Patient admits there is a room for improvement in her diet and drink choices. -  Suggestion is made for her to avoid simple carbohydrates  from her diet including Cakes, Sweet Desserts / Pastries, Ice Cream, Soda (diet and regular), Sweet Tea, Candies, Chips, Cookies, Store Bought Juices, Alcohol in Excess of  1-2 drinks a day, Artificial Sweeteners, and "Sugar-free" Products. This will help patient to have stable blood glucose profile and potentially avoid unintended weight gain.   - I encouraged her to switch to  unprocessed or minimally processed complex starch and increased protein intake (animal or plant source), fruits, and vegetables.  - she is  advised to stick to a routine mealtimes to eat 3 meals  a day and avoid unnecessary snacks ( to snack only to correct hypoglycemia).   - she is following with Jearld Fenton, RDN, CDE for individualized diabetes education.  - I have approached her with the following individualized plan to manage diabetes and patient agrees:   -She will continue to benefit from simplified treatment regimen.   -She is advised to increase her Novolin 70/30 to 50  units daily with breakfast and 50 units daily with supper for pre-meal blood glucose readings above 90 mg/dL,  associated with strict monitoring of glucose 2 times a day-before breakfast and before supper and at any other time as needed.  - she is warned not to take insulin without proper monitoring per orders. - she is encouraged to call clinic for blood glucose levels less than  70 or above 300 mg /dl. -She is advised to continue metformin 850 mg p.o. twice daily, therapeutically suitable for patient . -If her glycemic profile is not controlled optimally , she will be considered for basal/bolus insulin. - Patient specific target  A1c;  LDL, HDL, Triglycerides, and  Waist Circumference were discussed in detail.  2) BP/HTN:  her blood pressure is not controlled to target.    she is advised to continue her current medications including losartan 50 mg p.o. daily with breakfast . 3) Lipids/HPL:   Review of her recent lipid panel showed uncontrolled  LDL at 98.  she has documented adverse reactions to Lipitor.  She will be considered for low-dose Niaspan on next visit.    4)  Weight/Diet:  Body mass index is 36.08 kg/m.  - clearly complicating her diabetes care.  I discussed with her the fact that loss of 5 - 10% of her  current body weight will have the most impact on her diabetes management.  CDE Consult will be initiated . Exercise, and detailed carbohydrates information provided  -  detailed on discharge instructions.  5)  hypothyroidism-postsurgical   -She gives a remote past history of thyroidectomy for reportedly benign goiter.  She will have surveillance thyroid/neck ultrasound.  Her most recent thyroid function tests are consistent with appropriate replacement.   She is advised to continue levothyroxine 100 mcg p.o. every morning for now.  - We discussed about correct intake of levothyroxine, at fasting, with water, separated by at least 30 minutes from breakfast, and separated by more than 4 hours from calcium, iron, multivitamins, acid reflux medications (PPIs). -Patient is made aware of the fact that thyroid hormone replacement is needed for life, dose to be adjusted by periodic monitoring of thyroid function tests.  6) nodular goiter. -Her most recent thyroid/neck ultrasound revealed 2.9 cm nodule on the left lobe of her thyroid -She has surgically absent right thyroid lobe.  She is approached for fine-needle aspiration of 2.9 cm nodule on the left lobe to rule out malignancy.  7) vitamin D deficiency -I discussed and initiated vitamin D3 5000 units daily for the next 90 days. 8) Chronic Care/Health Maintenance:  -she  is on ARB and Statin medications and  is encouraged to initiate and continue to follow up with Ophthalmology, Dentist,  Podiatrist at least yearly or according to recommendations, and advised to  stay away from smoking. I have recommended yearly flu vaccine and pneumonia vaccine at least every 5 years; moderate intensity exercise for up to 150 minutes weekly; and  sleep for at least 7 hours a day.  - I advised patient to maintain close follow up with Susy Frizzle, MD for primary care needs.  - Time spent with the patient: 25 min, of which >50% was spent in reviewing her blood glucose logs , discussing her hypoglycemia and hyperglycemia episodes, reviewing her current and  previous labs / studies and medications  doses and developing a plan to avoid hypoglycemia and hyperglycemia. Please  refer to Patient Instructions for Blood Glucose Monitoring and Insulin/Medications Dosing Guide"  in media tab for additional information. [name] participated in the discussions, expressed understanding, and voiced agreement with the above plans.  All questions were answered to her satisfaction. she is encouraged to contact clinic should she have any questions or concerns prior to her return visit.   Follow up plan: - Return in about 3 months (around 09/11/2018) for Follow up with Biopsy Results, Follow up with  Pre-visit Labs, Meter, and Logs.  Glade Lloyd, MD [facility] [address] [place], [place] [address] Phone: [phone]  Fax: [phone]    06/11/2018, 12:19 PM  This note was partially dictated with voice recognition software. Similar sounding words can be transcribed inadequately or may not  be corrected upon review.

## 2018-06-27 ENCOUNTER — Ambulatory Visit (HOSPITAL_COMMUNITY): Payer: Medicare Other

## 2018-07-02 ENCOUNTER — Telehealth: Payer: Self-pay

## 2018-07-02 NOTE — Telephone Encounter (Signed)
She can take over-the-counter Tylenol 500 mg p.o. as needed, not more than 3 times a day.

## 2018-07-02 NOTE — Telephone Encounter (Signed)
[name] is calling stating that her neck is hurting and its hard to turn her head, her thyroid biopsy has been canceled at this time due to the COVID-19 she is asking if there is something she can do at home for the pain, please advise?

## 2018-07-03 NOTE — Telephone Encounter (Signed)
Tried to reach patient back and got no answer nor machine

## 2018-08-03 ENCOUNTER — Ambulatory Visit (INDEPENDENT_AMBULATORY_CARE_PROVIDER_SITE_OTHER): Payer: Medicare Other | Admitting: Specialist

## 2018-08-16 ENCOUNTER — Other Ambulatory Visit: Payer: Self-pay

## 2018-08-16 ENCOUNTER — Encounter: Payer: Self-pay | Admitting: Family Medicine

## 2018-08-16 ENCOUNTER — Ambulatory Visit (INDEPENDENT_AMBULATORY_CARE_PROVIDER_SITE_OTHER): Payer: Medicare Other | Admitting: Family Medicine

## 2018-08-16 VITALS — BP 126/68 | HR 72 | Temp 98.6°F | Resp 16 | Ht 65.0 in | Wt 210.0 lb

## 2018-08-16 DIAGNOSIS — N644 Mastodynia: Secondary | ICD-10-CM | POA: Diagnosis not present

## 2018-08-16 NOTE — Progress Notes (Signed)
Subjective:    Patient ID: [name], female    DOB: 1950-10-09, 68 y.o.   MRN: [id number]  HPI  Patient presents today requesting a referral to a new oncologist.  When asked why she states that she is having pain in her right breast.  She was diagnosed with breast cancer in the right side in 2003.  She had a recurrence in 2005.  At that time she underwent quotation mark chemotherapy" and a right mastectomy.  She has been cancer free per her report since 2005 and had a mammogram as recently of May 2019 that was normal.  She states that she is having shooting stinging pain in the right breast.  It begins in the outer quadrant of the right breast roughly at 7 or 8:00.  It radiates around the breast sometimes into the axilla sometimes towards the nipple.  The pain is sharp and intense.  There are no exacerbating or alleviating factors.  It comes and goes without reason.  She denies any pleurisy.  She denies any cough.  She denies any hemoptysis.  Taking a deep breath in her coughing does not exacerbate the pain.  Food does not exacerbate the pain.  She states that she has had the pain off and on for months. Past Medical History:  Diagnosis Date  . Anginal pain ([place]) 03/19/2012   saw Dr. Cathie Olden .. she thinks its more related to stomach issues  . Arthritis    "all over; mostly in my back" (03/20/2012)  . Breast cancer ([place])    "right" (03/20/2012)  . Cardiomyopathy   . Chest pain 06/2005   Hospitalized, dystolic dysfunction  . Chronic lower back pain    "have 2 herniated discs; going to have to have a fusion" (03/20/2012)  . Family history of anesthesia complication    "father has nausea and vomiting"  . Gallstones   . GERD (gastroesophageal reflux disease)   . H/O hiatal hernia   . History of kidney stones   . History of right mastectomy   . Hypertension    "patient states never had HTN, takes Hyzaar for heart  . Hypothyroidism   . Irritable bowel syndrome   . Migraines    "it's  been a long time since I've had one" (03/20/2012)  . Osteoarthritis   . Peripheral neuropathy   . PONV (postoperative nausea and vomiting)   . Sciatica   . Type II diabetes mellitus ([place])    Past Surgical History:  Procedure Laterality Date  . ABDOMINAL HYSTERECTOMY  1993  . adenosin cardiolite  07/2005   (+) wall motion abnormality  . AXILLARY LYMPH NODE DISSECTION  2003   squamous cell cancer  . BACK SURGERY    . CARDIAC CATHETERIZATION  ? 2005 / 2007  . CHOLECYSTECTOMY N/A 01/24/2018   Procedure: LAPAROSCOPIC CHOLECYSTECTOMY;  Surgeon: Erroll Luna, MD;  Location: [place];  Service: General;  Laterality: N/A;  . CT abdomen and pelvis  02/2010   Same  . ESOPHAGOGASTRODUODENOSCOPY  2005   GERD  . KNEE ARTHROSCOPY  1990's   "right" (03/20/2012)  . LUMBAR FUSION N/A 08/22/2014   Procedure: Right L2-3 and right L1-2 transforaminal lumbar interbody fusion with pedicle screws, rods, sleeves, cages, local bone graft, Vivigen, cancellous chips;  Surgeon: Jessy Oto, MD;  Location: [facility];  Service: Orthopedics;  Laterality: N/A;  . LUMBAR LAMINECTOMY/DECOMPRESSION MICRODISCECTOMY N/A 02/04/2014   Procedure: RIGHT L2-3 MICRODISCECTOMY ;  Surgeon: Jessy Oto, MD;  Location: [facility]  OR;  Service: Orthopedics;  Laterality: N/A;  . MASTECTOMY WITH AXILLARY LYMPH NODE DISSECTION  12/2003   "right" (03/20/2012)  . POSTERIOR CERVICAL FUSION/FORAMINOTOMY  2001  . SHOULDER ARTHROSCOPY W/ ROTATOR CUFF REPAIR  2003   Left  . THYROIDECTOMY  1977   Right  . TOTAL KNEE ARTHROPLASTY  2000   Right  . US of abdomen  07/2005   Fatty liver / kidney stones   Current Outpatient Medications on File Prior to Visit  Medication Sig Dispense Refill  . acetaminophen (TYLENOL) 500 MG tablet Take 1,000 mg by mouth every 6 (six) hours as needed for moderate pain or headache.    . amLODipine (NORVASC) 10 MG tablet Take 1 tablet (10 mg total) by mouth daily. 90 tablet 3  . aspirin 81 MG tablet Take 81 mg by  mouth daily.    . Blood Glucose Calibration (TRUE METRIX LEVEL 2) Normal SOLN Use as directed 3 each 3  . Blood Glucose Monitoring Suppl (TRUE METRIX AIR GLUCOSE METER) w/Device KIT 1 kit by Does not apply route 3 (three) times daily. 1 kit 0  . Cholecalciferol (VITAMIN D3) 125 MCG (5000 UT) CAPS Take 1 capsule (5,000 Units total) by mouth daily. 90 capsule 0  . esomeprazole (NEXIUM) 40 MG capsule Take 1 capsule (40 mg total) by mouth daily. 30 capsule 0  . fluticasone (FLONASE) 50 MCG/ACT nasal spray Place 2 sprays into both nostrils daily. (Patient taking differently: Place 2 sprays into both nostrils daily as needed for allergies. ) 48 g 3  . gabapentin (NEURONTIN) 300 MG capsule TAKE 1 CAPSULE BY MOUTH THREE TIMES A DAY 270 capsule 2  . glucose blood (TRUE METRIX BLOOD GLUCOSE TEST) test strip Use as instructed 100 each 12  . ibuprofen (ADVIL,MOTRIN) 800 MG tablet Take 1 tablet (800 mg total) by mouth every 8 (eight) hours as needed. 30 tablet 0  . insulin NPH-regular Human (70-30) 100 UNIT/ML injection Inject 50 Units into the skin 2 (two) times daily before a meal.    . levothyroxine (SYNTHROID, LEVOTHROID) 100 MCG tablet Take 1 tablet (100 mcg total) by mouth daily. 90 tablet 3  . lidocaine (XYLOCAINE) 2 % solution Use as directed 15 mLs in the mouth or throat every 6 (six) hours as needed for mouth pain. 100 mL 0  . losartan (COZAAR) 50 MG tablet Take 1 tablet (50 mg total) by mouth daily. 90 tablet 3  . metFORMIN (GLUCOPHAGE) 850 MG tablet Take 1 tablet (850 mg total) by mouth 2 (two) times daily with a meal. (Patient taking differently: Take 850 mg by mouth as needed. ) 180 tablet 0  . mometasone (ELOCON) 0.1 % cream Apply 1 application topically daily. 45 g 0  . NON FORMULARY [facility]  PAIN CREAMS- #17    . oxyCODONE (OXY IR/ROXICODONE) 5 MG immediate release tablet Take 1 tablet (5 mg total) by mouth every 6 (six) hours as needed for severe pain. 10 tablet 0  . polyethylene  glycol (MIRALAX / GLYCOLAX) packet Take 17 g by mouth daily as needed for moderate constipation.    . TRUEPLUS LANCETS 33G MISC Use as directed 300 each 3   No current facility-administered medications on file prior to visit.    Allergies  Allergen Reactions  . Dilaudid [Hydromorphone Hcl] Nausea And Vomiting  . Lipitor [Atorvastatin] Other (See Comments)    Body & Muscle Aches  . Adhesive [Tape] Rash  . Ampicillin Rash and Other (See Comments)      REACTION: Rash and welts on her hands when she took it in the hospital  . Latex Itching, Dermatitis, Rash and Other (See Comments)    REACTION: Patient had a reaction to tape after surgery and they told her she was allergic to Latex 03/20/2012 "if it's on too long it will break me out"  . Penicillins Rash and Other (See Comments)    Has patient had a PCN reaction causing immediate rash, facial/tongue/throat swelling, SOB or lightheadedness with hypotension: No Has patient had a PCN reaction causing severe rash involving mucus membranes or skin necrosis: No Has patient had a PCN reaction that required hospitalization: Yes - in hospital receiving treatment Has patient had a PCN reaction occurring within the last 10 years: No If all of the above answers are "NO", then may proceed with Cephalosporin use.    Social History   Socioeconomic History  . Marital status: Married    Spouse name: Not on file  . Number of children: 1  . Years of education: Not on file  . Highest education level: Not on file  Occupational History  . Occupation: Retired since 2003  Social Needs  . Financial resource strain: Not on file  . Food insecurity:    Worry: Not on file    Inability: Not on file  . Transportation needs:    Medical: Not on file    Non-medical: Not on file  Tobacco Use  . Smoking status: Never Smoker  . Smokeless tobacco: Never Used  Substance and Sexual Activity  . Alcohol use: No  . Drug use: No  . Sexual activity: Not on file   Lifestyle  . Physical activity:    Days per week: Not on file    Minutes per session: Not on file  . Stress: Not on file  Relationships  . Social connections:    Talks on phone: Not on file    Gets together: Not on file    Attends religious service: Not on file    Active member of club or organization: Not on file    Attends meetings of clubs or organizations: Not on file    Relationship status: Not on file  . Intimate partner violence:    Fear of current or ex partner: Not on file    Emotionally abused: Not on file    Physically abused: Not on file    Forced sexual activity: Not on file  Other Topics Concern  . Not on file  Social History Narrative  . Not on file     Review of Systems  All other systems reviewed and are negative.      Objective:   Physical Exam Vitals signs reviewed.  Constitutional:      Appearance: Normal appearance. She is normal weight.  Cardiovascular:     Rate and Rhythm: Normal rate and regular rhythm.  Pulmonary:     Effort: Pulmonary effort is normal. No respiratory distress.     Breath sounds: Normal breath sounds. No stridor. No wheezing, rhonchi or rales.  Chest:     Chest wall: No tenderness.    Neurological:     Mental Status: She is alert.           Assessment & Plan:  Diffuse non-cyclical breast pain - Plan: US BREAST COMPLETE UNI RIGHT INC AXILLA, MM Digital Diagnostic Unilat R  Exam does not reveal any palpable abnormality to explain the patient's pain.  However the pain does seem to radiate along the surgical scar  suggest underlying nerve damage as a potential cause of her pain.  However while performing her exam, she also occasionally complains of pain in her left breast suggesting underlying mastalgia.  I believe the most appropriate course of action would be to obtain a diagnostic mammogram of the right breast as well as an ultrasound of the right breast given that this is the worst location for her pain to rule out  underlying malignancy.  If the mammogram and ultrasound are completely normal, I would focus on treating neuropathic pain with possible topical lidocaine patches or switching gabapentin to Lyrica.  I explained to the patient that I do not feel a referral to an oncologist is necessary at this time unless there is evidence of cancer recurrence

## 2018-08-17 ENCOUNTER — Other Ambulatory Visit: Payer: Self-pay | Admitting: Family Medicine

## 2018-08-17 DIAGNOSIS — N644 Mastodynia: Secondary | ICD-10-CM

## 2018-09-04 ENCOUNTER — Ambulatory Visit
Admission: RE | Admit: 2018-09-04 | Discharge: 2018-09-04 | Disposition: A | Discharge status: Home / Self Care | Payer: Medicare Other | Source: Ambulatory Visit | Attending: Family Medicine | Admitting: Family Medicine

## 2018-09-04 ENCOUNTER — Ambulatory Visit: Payer: Medicare Other

## 2018-09-04 ENCOUNTER — Other Ambulatory Visit: Payer: Self-pay

## 2018-09-04 DIAGNOSIS — N644 Mastodynia: Secondary | ICD-10-CM | POA: Diagnosis not present

## 2018-09-08 ENCOUNTER — Telehealth: Payer: Self-pay | Admitting: Internal Medicine

## 2018-09-08 DIAGNOSIS — Z20822 Contact with and (suspected) exposure to covid-19: Secondary | ICD-10-CM

## 2018-09-08 NOTE — Telephone Encounter (Signed)
Left message requesting the patient to call [facility] back at [phone] regarding accompanying her mother to her [facility] on 09/06/18.  Mentioned that this RN had already spoken to her mother about this issue.

## 2018-09-08 NOTE — Addendum Note (Signed)
Addended by: Carlisle Beers on: 09/08/2018 04:38 PM   Modules accepted: Orders

## 2018-09-08 NOTE — Telephone Encounter (Signed)
covid testing scheduled 08/30/18 at 1245 at [facility]

## 2018-09-09 ENCOUNTER — Other Ambulatory Visit: Payer: Medicare Other

## 2018-09-09 ENCOUNTER — Other Ambulatory Visit: Payer: Self-pay | Admitting: Hematology

## 2018-09-09 DIAGNOSIS — K760 Fatty (change of) liver, not elsewhere classified: Secondary | ICD-10-CM

## 2018-09-09 DIAGNOSIS — Z20822 Contact with and (suspected) exposure to covid-19: Secondary | ICD-10-CM

## 2018-09-09 DIAGNOSIS — I10 Essential (primary) hypertension: Secondary | ICD-10-CM

## 2018-09-09 DIAGNOSIS — Z79899 Other long term (current) drug therapy: Secondary | ICD-10-CM

## 2018-09-09 DIAGNOSIS — E119 Type 2 diabetes mellitus without complications: Secondary | ICD-10-CM

## 2018-09-09 NOTE — Progress Notes (Signed)
Reorder COVID screen

## 2018-09-10 LAB — NOVEL CORONAVIRUS, NAA: SARS-CoV-2, NAA: NOT DETECTED

## 2018-09-11 ENCOUNTER — Ambulatory Visit: Payer: Medicare Other | Admitting: "Endocrinology

## 2018-09-12 ENCOUNTER — Encounter (HOSPITAL_COMMUNITY): Payer: Self-pay

## 2018-09-12 ENCOUNTER — Ambulatory Visit (HOSPITAL_COMMUNITY)
Admission: RE | Admit: 2018-09-12 | Discharge: 2018-09-12 | Disposition: A | Discharge status: Home / Self Care | Payer: Medicare Other | Source: Ambulatory Visit | Attending: "Endocrinology | Admitting: "Endocrinology

## 2018-09-12 ENCOUNTER — Other Ambulatory Visit: Payer: Self-pay

## 2018-09-12 DIAGNOSIS — E041 Nontoxic single thyroid nodule: Secondary | ICD-10-CM | POA: Diagnosis not present

## 2018-09-12 DIAGNOSIS — E042 Nontoxic multinodular goiter: Secondary | ICD-10-CM

## 2018-09-12 MED ORDER — LIDOCAINE HCL (PF) 2 % IJ SOLN
INTRAMUSCULAR | Status: AC
  Filled 2018-09-12: qty 10

## 2018-09-12 NOTE — Procedures (Signed)
PreOperative Dx: LEFT thyroid nodule Postoperative Dx: LEFT thyroid nodule Procedure:   US guided FNA of LEFT thyroid nodule Radiologist:  Thornton Papas Anesthesia:  2 ml of 2% lidocaine Specimen:  FNA x 5  EBL:   < 1 ml Complications: None

## 2018-10-02 ENCOUNTER — Ambulatory Visit: Payer: Medicare Other | Admitting: "Endocrinology

## 2018-10-02 DIAGNOSIS — E1159 Type 2 diabetes mellitus with other circulatory complications: Secondary | ICD-10-CM | POA: Diagnosis not present

## 2018-10-02 DIAGNOSIS — E890 Postprocedural hypothyroidism: Secondary | ICD-10-CM | POA: Diagnosis not present

## 2018-10-03 LAB — COMPLETE METABOLIC PANEL WITH GFR
AG RATIO: 1.1 (calc) (ref 1.0–2.5)
ALKALINE PHOSPHATASE (APISO): 54 U/L (ref 37–153)
ALT: 13 U/L (ref 6–29)
AST: 18 U/L (ref 10–35)
Albumin: 3.7 g/dL (ref 3.6–5.1)
BUN/Creatinine Ratio: 20 (calc) (ref 6–22)
BUN: 25 mg/dL (ref 7–25)
CO2: 25 mmol/L (ref 20–32)
Calcium: 11.1 mg/dL — ABNORMAL HIGH (ref 8.6–10.4)
Chloride: 106 mmol/L (ref 98–110)
Creat: 1.26 mg/dL — ABNORMAL HIGH (ref 0.60–0.93)
GFR, EST AFRICAN AMERICAN: 50 mL/min/{1.73_m2} — AB (ref >=60)
GFR, Est Non African American: 43 mL/min/{1.73_m2} — ABNORMAL LOW (ref >=60)
Globulin: 3.3 g/dL (calc) (ref 1.9–3.7)
Glucose, Bld: 191 mg/dL — ABNORMAL HIGH (ref 65–99)
Potassium: 4.5 mmol/L (ref 3.5–5.3)
Sodium: 140 mmol/L (ref 135–146)
Total Bilirubin: 0.4 mg/dL (ref 0.2–1.2)
Total Protein: 7 g/dL (ref 6.1–8.1)

## 2018-10-03 LAB — HEMOGLOBIN A1C
EAG (MMOL/L): 11.9 (calc)
Hgb A1c MFr Bld: 9.1 % of total Hgb — ABNORMAL HIGH (ref <5.7)
Mean Plasma Glucose: 214 (calc)

## 2018-10-03 LAB — T4, FREE: Free T4: 1 ng/dL (ref 0.8–1.8)

## 2018-10-03 LAB — VITAMIN D 25 HYDROXY (VIT D DEFICIENCY, FRACTURES): Vit D, 25-Hydroxy: 17 ng/mL — ABNORMAL LOW (ref 30–100)

## 2018-10-03 LAB — PTH, INTACT AND CALCIUM
Calcium: 11.1 mg/dL — ABNORMAL HIGH (ref 8.6–10.4)
PTH: 56 pg/mL (ref 14–64)

## 2018-10-03 LAB — TSH: TSH: 1.31 mIU/L (ref 0.40–4.50)

## 2018-10-04 ENCOUNTER — Other Ambulatory Visit: Payer: Self-pay

## 2018-10-04 ENCOUNTER — Encounter: Payer: Self-pay | Admitting: "Endocrinology

## 2018-10-04 ENCOUNTER — Ambulatory Visit (INDEPENDENT_AMBULATORY_CARE_PROVIDER_SITE_OTHER): Payer: Medicare Other | Admitting: "Endocrinology

## 2018-10-04 VITALS — BP 146/85 | HR 61 | Ht 65.0 in | Wt 216.0 lb

## 2018-10-04 DIAGNOSIS — E559 Vitamin D deficiency, unspecified: Secondary | ICD-10-CM | POA: Diagnosis not present

## 2018-10-04 DIAGNOSIS — I10 Essential (primary) hypertension: Secondary | ICD-10-CM | POA: Diagnosis not present

## 2018-10-04 DIAGNOSIS — E890 Postprocedural hypothyroidism: Secondary | ICD-10-CM

## 2018-10-04 DIAGNOSIS — E042 Nontoxic multinodular goiter: Secondary | ICD-10-CM | POA: Diagnosis not present

## 2018-10-04 DIAGNOSIS — E1159 Type 2 diabetes mellitus with other circulatory complications: Secondary | ICD-10-CM

## 2018-10-04 NOTE — Progress Notes (Signed)
Endocrinology follow-up Note       10/04/2018, 10:30 AM   Subjective:    Patient ID: [name], female    DOB: [birth year].  [name] is being seen in follow-up for management of currently uncontrolled,complicated symptomatic type 2 diabetes and associated hypertension, hypothyroidism. PMD :  Susy Frizzle, MD.   Past Medical History:  Diagnosis Date  . Anginal pain ([place]) 03/19/2012   saw Dr. Cathie Olden .. she thinks its more related to stomach issues  . Arthritis    "all over; mostly in my back" (03/20/2012)  . Breast cancer ([place])    "right" (03/20/2012)  . Cardiomyopathy   . Chest pain 06/2005   Hospitalized, dystolic dysfunction  . Chronic lower back pain    "have 2 herniated discs; going to have to have a fusion" (03/20/2012)  . Family history of anesthesia complication    "father has nausea and vomiting"  . Gallstones   . GERD (gastroesophageal reflux disease)   . H/O hiatal hernia   . History of kidney stones   . History of right mastectomy   . Hypertension    "patient states never had HTN, takes Hyzaar for heart  . Hypothyroidism   . Irritable bowel syndrome   . Migraines    "it's been a long time since I've had one" (03/20/2012)  . Osteoarthritis   . Peripheral neuropathy   . PONV (postoperative nausea and vomiting)   . Sciatica   . Type II diabetes mellitus ([place])    Past Surgical History:  Procedure Laterality Date  . ABDOMINAL HYSTERECTOMY  1993  . adenosin cardiolite  07/2005   (+) wall motion abnormality  . AXILLARY LYMPH NODE DISSECTION  2003   squamous cell cancer  . BACK SURGERY    . CARDIAC CATHETERIZATION  ? 2005 / 2007  . CHOLECYSTECTOMY N/A 01/24/2018   Procedure: LAPAROSCOPIC CHOLECYSTECTOMY;  Surgeon: Erroll Luna, MD;  Location: [facility];  Service: General;  Laterality: N/A;  . CT abdomen and pelvis  02/2010   Same  .  ESOPHAGOGASTRODUODENOSCOPY  2005   GERD  . KNEE ARTHROSCOPY  1990's   "right" (03/20/2012)  . LUMBAR FUSION N/A 08/22/2014   Procedure: Right L2-3 and right L1-2 transforaminal lumbar interbody fusion with pedicle screws, rods, sleeves, cages, local bone graft, Vivigen, cancellous chips;  Surgeon: Jessy Oto, MD;  Location: [facility];  Service: Orthopedics;  Laterality: N/A;  . LUMBAR LAMINECTOMY/DECOMPRESSION MICRODISCECTOMY N/A 02/04/2014   Procedure: RIGHT L2-3 MICRODISCECTOMY ;  Surgeon: Jessy Oto, MD;  Location: [facility];  Service: Orthopedics;  Laterality: N/A;  . MASTECTOMY Right   . MASTECTOMY WITH AXILLARY LYMPH NODE DISSECTION  12/2003   "right" (03/20/2012)  . POSTERIOR CERVICAL FUSION/FORAMINOTOMY  2001  . SHOULDER ARTHROSCOPY W/ ROTATOR CUFF REPAIR  2003   Left  . THYROIDECTOMY  1977   Right  . TOTAL KNEE ARTHROPLASTY  2000   Right  . [place] of abdomen  07/2005   Fatty liver / kidney stones   Social History   Socioeconomic History  . Marital status: Married    Spouse name: Not on file  .  Number of children: 1  . Years of education: Not on file  . Highest education level: Not on file  Occupational History  . Occupation: Retired since 2003  Social Needs  . Financial resource strain: Not on file  . Food insecurity    Worry: Not on file    Inability: Not on file  . Transportation needs    Medical: Not on file    Non-medical: Not on file  Tobacco Use  . Smoking status: Never Smoker  . Smokeless tobacco: Never Used  Substance and Sexual Activity  . Alcohol use: No  . Drug use: No  . Sexual activity: Not on file  Lifestyle  . Physical activity    Days per week: Not on file    Minutes per session: Not on file  . Stress: Not on file  Relationships  . Social Herbalist on phone: Not on file    Gets together: Not on file    Attends religious service: Not on file    Active member of club or organization: Not on file    Attends meetings of clubs or  organizations: Not on file    Relationship status: Not on file  Other Topics Concern  . Not on file  Social History Narrative  . Not on file   Outpatient Encounter Medications as of 10/04/2018  Medication Sig  . acetaminophen (TYLENOL) 500 MG tablet Take 1,000 mg by mouth every 6 (six) hours as needed for moderate pain or headache.  [name] Kitchen amLODipine (NORVASC) 10 MG tablet Take 1 tablet (10 mg total) by mouth daily.  [name] Kitchen aspirin 81 MG tablet Take 81 mg by mouth daily.  . Blood Glucose Calibration (TRUE METRIX LEVEL 2) Normal SOLN Use as directed  . Blood Glucose Monitoring Suppl (TRUE METRIX AIR GLUCOSE METER) w/Device KIT 1 kit by Does not apply route 3 (three) times daily.  . fluticasone (FLONASE) 50 MCG/ACT nasal spray Place 2 sprays into both nostrils daily. (Patient taking differently: Place 2 sprays into both nostrils daily as needed for allergies. )  . gabapentin (NEURONTIN) 300 MG capsule TAKE 1 CAPSULE BY MOUTH THREE TIMES A DAY  . glucose blood (TRUE METRIX BLOOD GLUCOSE TEST) test strip Use as instructed  . ibuprofen (ADVIL,MOTRIN) 800 MG tablet Take 1 tablet (800 mg total) by mouth every 8 (eight) hours as needed.  . insulin NPH-regular Human (70-30) 100 UNIT/ML injection Inject 50 Units into the skin 2 (two) times daily before a meal.  . levothyroxine (SYNTHROID, LEVOTHROID) 100 MCG tablet Take 1 tablet (100 mcg total) by mouth daily.  [name] Kitchen lidocaine (XYLOCAINE) 2 % solution Use as directed 15 mLs in the mouth or throat every 6 (six) hours as needed for mouth pain.  [name] Kitchen losartan (COZAAR) 50 MG tablet Take 1 tablet (50 mg total) by mouth daily.  . metFORMIN (GLUCOPHAGE) 850 MG tablet Take 1 tablet (850 mg total) by mouth 2 (two) times daily with a meal. (Patient taking differently: Take 850 mg by mouth as needed. )  . mometasone (ELOCON) 0.1 % cream Apply 1 application topically daily.  . NON FORMULARY [facility]  PAIN CREAMS- #17  . oxyCODONE (OXY IR/ROXICODONE) 5 MG immediate  release tablet Take 1 tablet (5 mg total) by mouth every 6 (six) hours as needed for severe pain.  . polyethylene glycol (MIRALAX / GLYCOLAX) packet Take 17 g by mouth daily as needed for moderate constipation.  . TRUEPLUS LANCETS 33G MISC Use as directed  . [  DISCONTINUED] Cholecalciferol (VITAMIN D3) 125 MCG (5000 UT) CAPS Take 1 capsule (5,000 Units total) by mouth daily.  . [DISCONTINUED] esomeprazole (NEXIUM) 40 MG capsule Take 1 capsule (40 mg total) by mouth daily.   No facility-administered encounter medications on file as of 10/04/2018.     ALLERGIES: Allergies  Allergen Reactions  . Dilaudid [Hydromorphone Hcl] Nausea And Vomiting  . Lipitor [Atorvastatin] Other (See Comments)    Body & Muscle Aches  . Adhesive [Tape] Rash  . Ampicillin Rash and Other (See Comments)    REACTION: Rash and welts on her hands when she took it in the hospital  . Latex Itching, Dermatitis, Rash and Other (See Comments)    REACTION: Patient had a reaction to tape after surgery and they told her she was allergic to Latex 03/20/2012 "if it's on too long it will break me out"  . Penicillins Rash and Other (See Comments)    Has patient had a PCN reaction causing immediate rash, facial/tongue/throat swelling, SOB or lightheadedness with hypotension: No Has patient had a PCN reaction causing severe rash involving mucus membranes or skin necrosis: No Has patient had a PCN reaction that required hospitalization: Yes - in hospital receiving treatment Has patient had a PCN reaction occurring within the last 10 years: No If all of the above answers are "NO", then may proceed with Cephalosporin use.     VACCINATION STATUS: Immunization History  Administered Date(s) Administered  . Influenza Split 01/31/2011  . Influenza Whole 02/23/2007  . Influenza, High Dose Seasonal PF 01/10/2017  . Influenza,inj,Quad PF,6+ Mos 01/07/2014, 02/09/2016, 01/01/2018  . Influenza-Unspecified 01/10/2012  . Pneumococcal  Polysaccharide-23 08/25/2014  . Tdap 04/20/2011    Diabetes She presents for her follow-up diabetic visit. She has type 2 diabetes mellitus. Onset time: She was diagnosed at approximate age of 19 years. Her disease course has been improving. There are no hypoglycemic associated symptoms. Pertinent negatives for hypoglycemia include no confusion, headaches, pallor or seizures. Associated symptoms include blurred vision, foot ulcerations, polydipsia and polyuria. Pertinent negatives for diabetes include no chest pain, no fatigue and no polyphagia. There are no hypoglycemic complications. Symptoms are improving. Diabetic complications include retinopathy. Risk factors for coronary artery disease include diabetes mellitus, hypertension, obesity, sedentary lifestyle and post-menopausal. Current diabetic treatment includes insulin injections (She is currently on Novolin 70/30 65 units a.m., 35 units p.m. along with metformin 850 mg p.o. twice daily.). Her weight is increasing steadily. She is following a generally unhealthy diet. When asked about meal planning, she reported none. She has not had a previous visit with a dietitian. She never participates in exercise. (She did not bring her logs nor meter to review today.  Her A1c is 9.1%, slightly improving from 9.8%.     She denies any hypoglycemic episodes.    ) An ACE inhibitor/angiotensin II receptor blocker is being taken. Eye exam is current.  Hypertension This is a chronic problem. The current episode started more than 1 year ago. The problem is uncontrolled. Associated symptoms include blurred vision. Pertinent negatives include no chest pain, headaches, palpitations or shortness of breath. Risk factors for coronary artery disease include diabetes mellitus, obesity, sedentary lifestyle, dyslipidemia and family history. Past treatments include angiotensin blockers. Hypertensive end-organ damage includes retinopathy. Identifiable causes of hypertension  include a thyroid problem.  Thyroid Problem Presents for follow-up visit. Onset time: She underwent what appears to be total thyroidectomy in her 57s for reportedly benign goiter.  Has taken thyroid hormone ever since, currently levothyroxine  100 mcg p.o. nightly. Patient reports no cold intolerance, diarrhea, fatigue, heat intolerance or palpitations. The symptoms have been stable. Past treatments include levothyroxine. Prior procedures include thyroidectomy.    Review of Systems  Constitutional: Negative for chills, fatigue, fever and unexpected weight change.  HENT: Negative for trouble swallowing and voice change.   Eyes: Positive for blurred vision. Negative for visual disturbance.  Respiratory: Negative for cough, shortness of breath and wheezing.   Cardiovascular: Negative for chest pain, palpitations and leg swelling.  Gastrointestinal: Negative for diarrhea, nausea and vomiting.  Endocrine: Positive for polydipsia and polyuria. Negative for cold intolerance, heat intolerance and polyphagia.  Musculoskeletal: Negative for arthralgias and myalgias.  Skin: Negative for color change, pallor, rash and wound.  Neurological: Negative for seizures and headaches.  Psychiatric/Behavioral: Negative for confusion and suicidal ideas.    Objective:    BP (!) 146/85   Pulse 61   Ht '5\' 5"'  (1.651 m)   Wt 216 lb (98 kg)   BMI 35.94 kg/m   Wt Readings from Last 3 Encounters:  10/04/18 216 lb (98 kg)  08/16/18 210 lb (95.3 kg)  06/11/18 210 lb 3.2 oz (95.3 kg)     Physical Exam Constitutional:      Appearance: She is well-developed.  HENT:     Head: Normocephalic and atraumatic.  Neck:     Musculoskeletal: Normal range of motion and neck supple.     Thyroid: No thyromegaly.     Trachea: No tracheal deviation.     Comments: Post thyroidectomy scar on anterior lower neck.  Palpable thyroid. Pulmonary:     Effort: Pulmonary effort is normal.  Abdominal:     Tenderness: There is no  abdominal tenderness. There is no guarding.  Musculoskeletal: Normal range of motion.  Skin:    General: Skin is warm and dry.     Coloration: Skin is not pale.     Findings: No erythema or rash.  Neurological:     Mental Status: She is alert and oriented to person, place, and time.     Cranial Nerves: No cranial nerve deficit.     Coordination: Coordination normal.     Deep Tendon Reflexes: Reflexes are normal and symmetric.     Recent Results (from the past 2160 hour(s))  Novel Coronavirus, NAA ([facility])     Status: None   Collection Time: 09/09/18 10:09 AM  Result Value Ref Range   SARS-CoV-2, NAA Not Detected Not Detected    Comment: This test was developed and its performance characteristics determined by [employer]. This test has not been FDA cleared or approved. This test has been authorized by FDA under an Emergency Use Authorization (EUA). This test is only authorized for the duration of time the declaration that circumstances exist justifying the authorization of the emergency use of in vitro diagnostic tests for detection of SARS-CoV-2 virus and/or diagnosis of COVID-19 infection under section 564(b)(1) of the Act, 21 U.S.C. 496PRF-1(M)(3), unless the authorization is terminated or revoked sooner. When diagnostic testing is negative, the possibility of a false negative result should be considered in the context of a patient's recent exposures and the presence of clinical signs and symptoms consistent with COVID-19. An individual without symptoms of COVID-19 and who is not shedding SARS-CoV-2 virus would expect to have a negative (not detected) result in this assay.   PTH, intact and calcium     Status: Abnormal   Collection Time: 10/02/18 11:59 AM  Result Value Ref Range  PTH 56 14 - 64 pg/mL    Comment: . Interpretive Guide    Intact PTH           Calcium ------------------    ----------           ------- Normal Parathyroid    Normal                Normal Hypoparathyroidism    Low or Low Normal    Low Hyperparathyroidism    Primary            Normal or High       High    Secondary          High                 Normal or Low    Tertiary           High                 High Non-Parathyroid    Hypercalcemia      Low or Low Normal    High .    Calcium 11.1 (H) 8.6 - 10.4 mg/dL  T4, free     Status: None   Collection Time: 10/02/18 11:59 AM  Result Value Ref Range   Free T4 1.0 0.8 - 1.8 ng/dL  Hemoglobin A1c     Status: Abnormal   Collection Time: 10/02/18 11:59 AM  Result Value Ref Range   Hgb A1c MFr Bld 9.1 (H) <5.7 % of total Hgb    Comment: For someone without known diabetes, a hemoglobin A1c value of 6.5% or greater indicates that they may have  diabetes and this should be confirmed with a follow-up  test. . For someone with known diabetes, a value <7% indicates  that their diabetes is well controlled and a value  greater than or equal to 7% indicates suboptimal  control. A1c targets should be individualized based on  duration of diabetes, age, comorbid conditions, and  other considerations. . Currently, no consensus exists regarding use of hemoglobin A1c for diagnosis of diabetes for children. .    Mean Plasma Glucose 214 (calc)   eAG (mmol/L) 11.9 (calc)  COMPLETE METABOLIC PANEL WITH GFR     Status: Abnormal   Collection Time: 10/02/18 11:59 AM  Result Value Ref Range   Glucose, Bld 191 (H) 65 - 99 mg/dL    Comment: .            Fasting reference interval . For someone without known diabetes, a glucose value >125 mg/dL indicates that they may have diabetes and this should be confirmed with a follow-up test. .    BUN 25 7 - 25 mg/dL   Creat 1.26 (H) 0.60 - 0.93 mg/dL    Comment: For patients >25 years of age, the reference limit for Creatinine is approximately 13% higher for people identified as African-American. .    GFR, Est Non African American 43 (L) > OR = 60 mL/min/1.6m   GFR, Est African  American 50 (L) > OR = 60 mL/min/1.725m  BUN/Creatinine Ratio 20 6 - 22 (calc)   Sodium 140 135 - 146 mmol/L   Potassium 4.5 3.5 - 5.3 mmol/L   Chloride 106 98 - 110 mmol/L   CO2 25 20 - 32 mmol/L   Calcium 11.1 (H) 8.6 - 10.4 mg/dL   Total Protein 7.0 6.1 - 8.1 g/dL   Albumin 3.7 3.6 - 5.1 g/dL   Globulin 3.3  1.9 - 3.7 g/dL (calc)   AG Ratio 1.1 1.0 - 2.5 (calc)   Total Bilirubin 0.4 0.2 - 1.2 mg/dL   Alkaline phosphatase (APISO) 54 37 - 153 U/L   AST 18 10 - 35 U/L   ALT 13 6 - 29 U/L  TSH     Status: None   Collection Time: 10/02/18 11:59 AM  Result Value Ref Range   TSH 1.31 0.40 - 4.50 mIU/L  VITAMIN D 25 Hydroxy (Vit-D Deficiency, Fractures)     Status: Abnormal   Collection Time: 10/02/18 11:59 AM  Result Value Ref Range   Vit D, 25-Hydroxy 17 (L) 30 - 100 ng/mL    Comment: Vitamin D Status         25-OH Vitamin D: . Deficiency:                    <20 ng/mL Insufficiency:             20 - 29 ng/mL Optimal:                 > or = 30 ng/mL . For 25-OH Vitamin D testing on patients on  D2-supplementation and patients for whom quantitation  of D2 and D3 fractions is required, the [facility](TM) 25-OH VIT D, (D2,D3), LC/MS/MS is recommended: order  code [phone] (patients >30yr). See Note 1 . Note 1 . For additional information, please refer to  http://education.[facility].com/faq/FAQ199  (This link is being provided for informational/ educational purposes only.)      Diabetic Labs (most recent): Lab Results  Component Value Date   HGBA1C 9.1 (H) 10/02/2018   HGBA1C 9.8 (H) 06/01/2018   HGBA1C 10.4 (H) 01/19/2018     Lipid Panel ( most recent) Lipid Panel     Component Value Date/Time   CHOL 184 06/01/2018 1525   TRIG 338 (H) 06/01/2018 1525   HDL 43 (L) 06/01/2018 1525   CHOLHDL 4.3 06/01/2018 1525   VLDL 49 (H) 06/30/2016 0944   LDLCALC 96 06/01/2018 1525      Assessment & Plan:   1. DM type 2 causing vascular disease ([facility]  - BDe Burrshas currently uncontrolled symptomatic type 2 DM since 68years of age. -He did not bring her logs or meter, however her A1c is 9.1% improving from 10.4%.   Recent labs, biopsy results reviewed.   -her diabetes is complicated by retinopathy, obesity/sedentary life, and she remains at a high risk for more acute and chronic complications which include CAD, CVA, CKD, retinopathy, and neuropathy. These are all discussed in detail with her.  - I have counseled her on diet management and weight loss, by adopting a carbohydrate restricted/protein rich diet.  - she  admits there is a room for improvement in her diet and drink choices. -  Suggestion is made for her to avoid simple carbohydrates  from her diet including Cakes, Sweet Desserts / Pastries, Ice Cream, Soda (diet and regular), Sweet Tea, Candies, Chips, Cookies, Sweet Pastries,  Store Bought Juices, Alcohol in Excess of  1-2 drinks a day, Artificial Sweeteners, Coffee Creamer, and "Sugar-free" Products. This will help patient to have stable blood glucose profile and potentially avoid unintended weight gain.   - I encouraged her to switch to  unprocessed or minimally processed complex starch and increased protein intake (animal or plant source), fruits, and vegetables.  - she is advised to stick to a routine mealtimes to eat 3 meals  a day and avoid  unnecessary snacks ( to snack only to correct hypoglycemia).   - she is following with Jearld Fenton, RDN, CDE for individualized diabetes education.  - I have approached her with the following individualized plan to manage diabetes and patient agrees:   -She will continue to benefit from her current simplified insulin treatment regimen.    -She is advised to continue Novolin 70/30 50  units daily with breakfast and 50 units daily with supper for pre-meal blood glucose readings above 90 mg/dL,  associated with strict monitoring of glucose 2 times a day-before breakfast and before  supper and at any other time as needed.  - she is warned not to take insulin without proper monitoring per orders. - she is encouraged to call clinic for blood glucose levels less than 70 or above 300 mg /dl. -She is advised to continue metformin 850 mg p.o. once daily, therapeutically suitable for patient . -If her glycemic profile is not controlled optimally , she will be considered for basal/bolus insulin. - Patient specific target  A1c;  LDL, HDL, Triglycerides, and  Waist Circumference were discussed in detail.  2) BP/HTN:  her blood pressure is not controlled to target.  She missed her blood pressure medications this morning.      she is advised to continue her current medications including losartan 50 mg p.o. daily with breakfast .  3) Lipids/HPL:   Review of her recent lipid panel showed uncontrolled  LDL at 98.  she has documented adverse reactions to Lipitor.  She will be considered for low-dose Niaspan on next visit.    4)  Weight/Diet:  Body mass index is 35.94 kg/m.  - clearly complicating her diabetes care.  I discussed with her the fact that loss of 5 - 10% of her  current body weight will have the most impact on her diabetes management.  CDE Consult will be initiated . Exercise, and detailed carbohydrates information provided  -  detailed on discharge instructions.  5) hypothyroidism-postsurgical   -She gives a remote past history of thyroidectomy for reportedly benign goiter.  She will have surveillance thyroid/neck ultrasound.  Her most recent thyroid function tests are consistent with appropriate replacement.   She is advised to continue levothyroxine 100 mcg p.o. every morning for now.  - We discussed about correct intake of levothyroxine, at fasting, with water, separated by at least 30 minutes from breakfast, and separated by more than 4 hours from calcium, iron, multivitamins, acid reflux medications (PPIs). -Patient is made aware of the fact that thyroid hormone  replacement is needed for life, dose to be adjusted by periodic monitoring of thyroid function tests.  6) nodular goiter. -Her most recent thyroid/neck ultrasound revealed 2.9 cm nodule on the left lobe of her thyroid -She has surgically absent right thyroid lobe.  Biopsy of 2.9 cm nodule on the left lobe is benign.    She would not need any surgical intervention at this time.     7) vitamin D deficiency -She is advised to continue vitamin D3 5000 units daily for the next 90 days.  8) Chronic Care/Health Maintenance:  -she  is on ARB and Statin medications and  is encouraged to initiate and continue to follow up with Ophthalmology, Dentist,  Podiatrist at least yearly or according to recommendations, and advised to  stay away from smoking. I have recommended yearly flu vaccine and pneumonia vaccine at least every 5 years; moderate intensity exercise for up to 150 minutes weekly; and  sleep for at  least 7 hours a day.  - I advised patient to maintain close follow up with Susy Frizzle, MD for primary care needs. - Time spent with the patient: 25 min, of which >50% was spent in reviewing her blood glucose logs , discussing her hypoglycemia and hyperglycemia episodes, reviewing her current and  previous labs / studies and medications  doses and developing a plan to avoid hypoglycemia and hyperglycemia. Please refer to Patient Instructions for Blood Glucose Monitoring and Insulin/Medications Dosing Guide"  in media tab for additional information. Please  also refer to " Patient Self Inventory" in the Media  tab for reviewed elements of pertinent patient history.  [name] participated in the discussions, expressed understanding, and voiced agreement with the above plans.  All questions were answered to her satisfaction. she is encouraged to contact clinic should she have any questions or concerns prior to her return visit.   Follow up plan: - Return in about 4 months (around  02/03/2019) for Follow up with Pre-visit Labs, Meter, and Logs.  Glade Lloyd, MD [facility] [address] [place], [place] [address] Phone: [phone]  Fax: [phone]    10/04/2018, 10:30 AM  This note was partially dictated with voice recognition software. Similar sounding words can be transcribed inadequately or may not  be corrected upon review.

## 2018-10-04 NOTE — Patient Instructions (Signed)

## 2018-10-18 ENCOUNTER — Ambulatory Visit (INDEPENDENT_AMBULATORY_CARE_PROVIDER_SITE_OTHER): Payer: Medicare Other | Admitting: Family Medicine

## 2018-10-18 ENCOUNTER — Other Ambulatory Visit: Payer: Self-pay

## 2018-10-18 VITALS — BP 160/80 | HR 66 | Temp 98.8°F | Resp 18 | Ht 65.0 in | Wt 215.0 lb

## 2018-10-18 DIAGNOSIS — M7989 Other specified soft tissue disorders: Secondary | ICD-10-CM

## 2018-10-18 DIAGNOSIS — E213 Hyperparathyroidism, unspecified: Secondary | ICD-10-CM

## 2018-10-18 DIAGNOSIS — M542 Cervicalgia: Secondary | ICD-10-CM

## 2018-10-18 DIAGNOSIS — R112 Nausea with vomiting, unspecified: Secondary | ICD-10-CM

## 2018-10-18 MED ORDER — LEVOTHYROXINE SODIUM 100 MCG PO TABS: 100.0000 ug | ORAL_TABLET | Freq: Every day | ORAL | 3 refills | Status: DC

## 2018-10-18 MED ORDER — TIZANIDINE HCL 4 MG PO TABS: 4.0000 mg | ORAL_TABLET | Freq: Four times a day (QID) | ORAL | 0 refills | Status: DC | PRN

## 2018-10-18 MED ORDER — METOCLOPRAMIDE HCL 10 MG PO TABS: 10.0000 mg | ORAL_TABLET | Freq: Three times a day (TID) | ORAL | 0 refills | Status: DC

## 2018-10-18 MED ORDER — FUROSEMIDE 40 MG PO TABS: 40.0000 mg | ORAL_TABLET | Freq: Every day | ORAL | 3 refills | Status: DC | PRN

## 2018-10-18 NOTE — Progress Notes (Signed)
Subjective:    Patient ID: [name], female    DOB: [birth year], 68 y.o.   MRN: [id number]  HPI  05/18/17 Patient called 1/29 and stated she could no longer afford toujeo 80 units a day and wanted to switch to generic 70/30.  I recommended 45 units in the am and 25 units in the pm.  Here today to titrate insulin dose.  After her last visit, I asked the patient to resume hydrochlorothiazide which she has stopped.  She did resume the hydrochlorothiazide but then discontinued losartan for reasons I am uncertain of.  As a result her blood pressure is elevated today.  She denies any chest pain shortness of breath or dyspnea on exertion.  She is taking her insulin as prescribed however she skips meals frequently.  She is only checked for sugar values.  Each of these readings were in the morning before she ate breakfast.  They are as follows: 260, 210, 170, 160.  At that time, my plan was:  Patient is instructed to continue hydrochlorothiazide 12.5 mg a day.  However she is instructed to resume losartan 50 mg a day.  I would like to recheck her blood pressure next week.  I have recommended that she take her insulin as prescribed 45 units in the morning and 25 unit.  I want her to check her blood sugar before every meal breakfast, lunch, dinner, and record the values that I can see the sugar readings in 1 week when.  For random sugars are not sufficient to make safe accurate adjustments in her insulin dose.  I also explained her the dangers and risk of hypoglycemia if she does not eat regularly.  I encouraged the patient to eat 3 regular meals per day to be compliant with her insulin.  Over the last few visits, the patient seems increasingly more confused.  I am concerned that she may be showing signs of possible dementia.  I will perform a Mini-Mental status exam at her next visit.  05/25/17 Patient presents today with sugar readings just as I have asked her to do.  Her blood sugars in the morning  are averaging between 140 and 170.  Her blood sugars at lunchtime are averaging between 170 and 200.  Her blood sugars in the evening are 200-250.  She is taking 45 units in the morning and 25 units in the evening after dinner.  Her blood pressure today is no better than it was last time.  Patient has still not started taking her blood pressure medication.  She states that she forgets to take the pill.  I performed a Mini-Mental status exam today which she scored 30 out of 30 on.  I was quite surprised by her performance on the Mini-Mental status exam as I was concerned that she may have some underlying dementia.  I do still feel that she has some underlying memory problems.  At the present time, she is administering her own medication.  Occasionally her husband will ask her if she is taken it but he is not directly supervising the medication.  At that time, my plan was: Spent 25 minutes today with the patient and her husband.  Despite her performance on the Mini-Mental status exam which she scored 30 out of 30 I feel that she has some mild short-term memory loss.  I suspect that given her medical history she is likely suffered some small strokes and may have some underlying vascular dementia.  Therefore I want to  work hard to reduce her risk factors.  Increase her insulin to 55 units in the morning.  Increase the evening dose to 30 units before supper.  Recheck 3 times daily sugars in 1 week.  Also gave strategies to help her remember to take her medication for her blood pressure and also recommended strongly that her husband administer the medicine to her or at least supervise her take her medication because of the memory problems    06/01/17 Blood pressure is much better now that she is taking both of her medications.  Blood pressure today is 126/78.  I continue to encourage compliance.  She is currently on 55 units of 70/30 in the morning and 30 units of 70/30 in the afternoon.  Fasting blood sugars are  130-100 and.  Lunchtime sugars are 200-250.  Dinner sugars are 200-250.  There is no episodes of hypoglycemia.  At that time, my plan was: Increase insulin 70/30-65 units in the morning to better manage her lunch and suppertime sugars.  Continue 30 units in the evening to avoid nocturnal hypoglycemia.  Recheck sugars in 1 week to make further titrations in insulin dosage.  Blood pressure is now well controlled.  No further change in medication.  Continue to encourage compliance  01/01/18 Patient was recently in the hospital with substernal chest pain/epigastric chest pain.  Cardiac enzymes were negative x2.  Patient had a CT scan of the abdomen and pelvis that showed small gallstones and layering sludge within the gallbladder.  Right upper quadrant ultrasound also confirmed gallstones but no evidence of cholecystitis.  She was discharged home on Nexium for acid reflux along with viscous lidocaine and was instructed to schedule a follow-up with general surgery.  It is important to note that the patient was seen by cardiology in November 2018 and had a [place] Myoview performed which revealed no evidence of reversible ischemia and was similar to her previous stress test from 2017.  Cardiology at that time recommended no further cardiac work-up.  The patient's history today is very confusing.  She states that she is having substernal chest pain that is triggered by activity and it improves with rest.  Symptoms do not sound like gallstones.  She is already scheduled appointment to see the general surgeon.  She also reports nausea.  She denies any vomiting.  She denies any shortness of breath.  She denies any left arm pain.  She has no right upper quadrant tenderness.  She denies any association of the pain with food.  However viscous lidocaine did seem to help the pain when she was took it in the emergency room.  She was also found to have elevated calcium levels.  I reviewed the patient's medication list with  her.  It is very confusing.  Patient has not been taking her blood pressure medication at all.  She has not been taking hydrochlorothiazide.  She has not been taking losartan.  She is also usually not taking her insulin.  When she does take insulin she takes 65 units in the morning and 30 units in the evening.  She frequently forgets to take her medication.  I do not believe that this is noncompliance however I am concerned about dementia.  Patient had an MRI of the brain performed in May which showed chronic small vessel ischemic changes consistent with possible underlying vascular dementia.  Patient is administering her own medication and based on our conversation today, she cannot recall any of the names of the medicine she  is taking and is not certain of whether or not she is taking them although she is adamant that she has not taken her blood pressure medication in more than a week.  She is also not checking her sugars.  At that time, my plan was: Pressure is now much better at 142/70.  The patient has an appointment today to see the surgeon to discuss her gallstones.  She continues to have atypical substernal chest pain now unrelated to activity with no shortness of breath.  I am not convinced that this is her gallbladder.  If the surgeon agrees, I would recommend with a GI consultation for possible EGD.  However I would appreciate the patient seeing the surgeon today to discuss her hyperparathyroidism.  PTH was greater than 70 despite her elevated calcium level.  I have recommended discontinuation of calcium and hydrochlorothiazide and then repeat a BMP in 1 month.  If calcium levels remain elevated, she may require surgical excision of her parathyroid glands.  Recheck the patient in 1 month.  Thankfully blood pressure is much better today   02/05/18 Several issues today to be discussed. 1) hyperparathyroidism.  PTH was greater than 70 despite hypercalcemia.  She is here today to recheck her calcium  level.  I have scheduled her an appointment to meet with an endocrinologist, Dr. Dorris Fetch to discuss treatment options for hyperparathyroidism.  She has met with a general surgeon and he removed her gallbladder since I last saw the patient.  He deferred management of her hyperparathyroidism until after the surgery and recommended an endocrinology consult.  2) hypertension.  Patient is now compliant with her blood pressure medication.  Her husband is administering her medicine.  Her blood pressure today is more appropriate 142/76  3) diabetes.  Patient has not been checking her sugars or taking her insulin appropriately.  Her last hemoglobin A1c in October was greater than 10.  This indicates to me that her blood sugars have been averaging in the 250s to 300s which is not with the patient is reporting to me.  I believe this is likely due to to noncompliance however I have recommended an endocrinologist begin managing her diabetes to try to affect better control as I have been unsuccessful in counseling and motivating the patient to control her diabetes.  She has an appointment to see Dr. Dorris Fetch in November  4) atypical chest pain.  Please see the discussion in the office visit from September 2019.  Cardiology did not believe her chest pain was cardiac in nature and have released her from further work-up.  The patient's gallbladder was recently removed.  However she continues to report substernal chest pain.  She states that she is fine in the morning.  However when she stands up, the pain begins.  If she lies down the pain goes away.  It radiates from her sternum into her back.  She is taking Nexium although she seems confused when I asked her about this.  She does report a morning cough.  She denies any reflux.  I have recommended a GI consultation and EGD  5) patient continues to report chronic fatigue.  She is in denial that her elevated blood sugars and uncontrolled hypertension could be causing her fatigue.   On her stress test from last year, her ejection fraction was 47%.  I question if her ventricular function may have declined further due to her poorly controlled diabetes and her poorly controlled hypertension.  She does report shortness of breath with  activity.  A CBC recently ruled out anemia.  No one has checked her thyroid in over a year.  10/18/18 Patient is now seeing endocrinology for her diabetes.  She presents today with several concerns.  First she reports pain in her neck.  This is been going on for several months.  In fact has a history of a fusion at C4-C5.  Her MRI was last obtained of her neck approximately 3 years ago which showed degenerative disc disease at several levels as well as arthritic changes but no significant normalities.  The patient reports right anterior neck pain that occurs in spasms almost like cramps on a daily basis.  The pains will come and go in her sternocleidomastoid muscle.  She reports pain with flexion and extension as well as rotation.  She is tried stretching her neck but this does not help prevent or relax the muscle spasms she gets in her neck.  Next issue is swelling in her legs.  She is on amlodipine and gabapentin.  She has trace to +1 pitting edema in both legs right slightly greater than left up to her mid shin.  She denies any shortness of breath or chest pain.  Third issue is nausea and vomiting.  She states this is been going on for several months.  She wakes up feeling bloated and nauseated even before she eats.  After she eats she reports early satiety and feeling very nauseated.  Over the last few weeks she is also vomited clear yellow fluid on occasion.  She feels nauseated all throughout the day.  She denies any fevers or chills or diarrhea.  She has a longstanding history of uncontrolled diabetes secondary to noncompliance.  Fourth issue is her hyperparathyroidism.  I reviewed the most recent labs from her endocrinologist in June which showed a mildly  elevated calcium level of 11.1.  At the present time she has not had surgery to remove her parathyroid glands.  She is not on any specific treatment Current Outpatient Medications on File Prior to Visit  Medication Sig Dispense Refill   acetaminophen (TYLENOL) 500 MG tablet Take 1,000 mg by mouth every 6 (six) hours as needed for moderate pain or headache.     amLODipine (NORVASC) 10 MG tablet Take 1 tablet (10 mg total) by mouth daily. 90 tablet 3   aspirin 81 MG tablet Take 81 mg by mouth daily.     Blood Glucose Calibration (TRUE METRIX LEVEL 2) Normal SOLN Use as directed 3 each 3   Blood Glucose Monitoring Suppl (TRUE METRIX AIR GLUCOSE METER) w/Device KIT 1 kit by Does not apply route 3 (three) times daily. 1 kit 0   fluticasone (FLONASE) 50 MCG/ACT nasal spray Place 2 sprays into both nostrils daily. (Patient taking differently: Place 2 sprays into both nostrils daily as needed for allergies. ) 48 g 3   gabapentin (NEURONTIN) 300 MG capsule TAKE 1 CAPSULE BY MOUTH THREE TIMES A DAY 270 capsule 2   glucose blood (TRUE METRIX BLOOD GLUCOSE TEST) test strip Use as instructed 100 each 12   ibuprofen (ADVIL,MOTRIN) 800 MG tablet Take 1 tablet (800 mg total) by mouth every 8 (eight) hours as needed. 30 tablet 0   insulin NPH-regular Human (70-30) 100 UNIT/ML injection Inject 50 Units into the skin 2 (two) times daily before a meal.     lidocaine (XYLOCAINE) 2 % solution Use as directed 15 mLs in the mouth or throat every 6 (six) hours as needed for mouth pain.  100 mL 0   losartan (COZAAR) 50 MG tablet Take 1 tablet (50 mg total) by mouth daily. 90 tablet 3   metFORMIN (GLUCOPHAGE) 850 MG tablet Take 1 tablet (850 mg total) by mouth 2 (two) times daily with a meal. (Patient taking differently: Take 850 mg by mouth as needed. ) 180 tablet 0   mometasone (ELOCON) 0.1 % cream Apply 1 application topically daily. 45 g 0   NON FORMULARY [facility]  PAIN CREAMS- #17      oxyCODONE (OXY IR/ROXICODONE) 5 MG immediate release tablet Take 1 tablet (5 mg total) by mouth every 6 (six) hours as needed for severe pain. 10 tablet 0   polyethylene glycol (MIRALAX / GLYCOLAX) packet Take 17 g by mouth daily as needed for moderate constipation.     TRUEPLUS LANCETS 33G MISC Use as directed 300 each 3   No current facility-administered medications on file prior to visit.     Allergies  Allergen Reactions   Dilaudid [Hydromorphone Hcl] Nausea And Vomiting   Lipitor [Atorvastatin] Other (See Comments)    Body & Muscle Aches   Adhesive [Tape] Rash   Ampicillin Rash and Other (See Comments)    REACTION: Rash and welts on her hands when she took it in the hospital   Latex Itching, Dermatitis, Rash and Other (See Comments)    REACTION: Patient had a reaction to tape after surgery and they told her she was allergic to Latex 03/20/2012 "if it's on too long it will break me out"   Penicillins Rash and Other (See Comments)    Has patient had a PCN reaction causing immediate rash, facial/tongue/throat swelling, SOB or lightheadedness with hypotension: No Has patient had a PCN reaction causing severe rash involving mucus membranes or skin necrosis: No Has patient had a PCN reaction that required hospitalization: Yes - in hospital receiving treatment Has patient had a PCN reaction occurring within the last 10 years: No If all of the above answers are "NO", then may proceed with Cephalosporin use.    Social History   Socioeconomic History   Marital status: Married    Spouse name: Not on file   Number of children: 1   Years of education: Not on file   Highest education level: Not on file  Occupational History   Occupation: Retired since 2003  [place] resource strain: Not on file   Food insecurity    Worry: Not on file    Inability: Not on Lexicographer needs    Medical: Not on file    Non-medical: Not on file  Tobacco Use    Smoking status: Never Smoker   Smokeless tobacco: Never Used  Substance and Sexual Activity   Alcohol use: No   Drug use: No   Sexual activity: Not on file  Lifestyle   Physical activity    Days per week: Not on file    Minutes per session: Not on file   Stress: Not on file  Relationships   Social connections    Talks on phone: Not on file    Gets together: Not on file    Attends religious service: Not on file    Active member of club or organization: Not on file    Attends meetings of clubs or organizations: Not on file    Relationship status: Not on file   Intimate partner violence    Fear of current or ex partner: Not on file  Emotionally abused: Not on file    Physically abused: Not on file    Forced sexual activity: Not on file  Other Topics Concern   Not on file  Social History Narrative   Not on file   Review of Systems  All other systems reviewed and are negative.    Review of Systems  All other systems reviewed and are negative.      Objective:   Physical Exam  Constitutional: She is oriented to person, place, and time. She appears well-developed and well-nourished. No distress.  Neck: Neck supple.  Cardiovascular: Normal rate, regular rhythm and normal heart sounds. Exam reveals no gallop and no friction rub.  No murmur heard. Pulmonary/Chest: Effort normal and breath sounds normal. No respiratory distress. She has no wheezes. She has no rales. She exhibits no tenderness.  Abdominal: Soft. Bowel sounds are normal. She exhibits no distension and no mass. There is no abdominal tenderness. There is no rebound and no guarding.  Musculoskeletal:     Cervical back: She exhibits decreased range of motion and pain. She exhibits no tenderness, no bony tenderness, no swelling, no edema and no spasm.  Lymphadenopathy:    She has no cervical adenopathy.  Neurological: She is alert and oriented to person, place, and time.  Skin: She is not diaphoretic.    Vitals reviewed.         Assessment & Plan:  1. Neck pain Likely due to arthritis and degenerative disc disease.  She cannot tolerate NSAIDs due to chronic kidney disease.  I will give the patient Zanaflex 4 mg tablets every 6 hours as needed for muscle spasms and refer the patient to physical therapy for stretching exercises to help prevent 2. Leg swelling Likely due to medication as well as chronic venous insufficiency.  Have recommended Lasix 40 mg p.o. daily as needed leg swelling  3. Nausea and vomiting, intractability of vomiting not specified, unspecified vomiting type Question that this may be due to gastroparesis given her medical history.  We will empirically try Reglan 10 mg every 6 hours as needed and then reassess in 1 week to see if her symptoms have improved  4. Hyperparathyroidism ([place]) It is mild at the present time.  She is seeing endocrinology.  We will continue to monitor along with him and if calcium level rises may need referral for parathyroidectomy

## 2018-11-19 DIAGNOSIS — Z961 Presence of intraocular lens: Secondary | ICD-10-CM | POA: Diagnosis not present

## 2018-11-19 DIAGNOSIS — H35372 Puckering of macula, left eye: Secondary | ICD-10-CM | POA: Diagnosis not present

## 2018-11-19 DIAGNOSIS — H16223 Keratoconjunctivitis sicca, not specified as Sjogren's, bilateral: Secondary | ICD-10-CM | POA: Diagnosis not present

## 2018-11-19 DIAGNOSIS — E113292 Type 2 diabetes mellitus with mild nonproliferative diabetic retinopathy without macular edema, left eye: Secondary | ICD-10-CM | POA: Diagnosis not present

## 2018-11-19 LAB — HM DIABETES EYE EXAM

## 2018-11-23 ENCOUNTER — Ambulatory Visit: Payer: Self-pay

## 2018-11-23 ENCOUNTER — Ambulatory Visit (INDEPENDENT_AMBULATORY_CARE_PROVIDER_SITE_OTHER): Payer: Medicare Other | Admitting: Specialist

## 2018-11-23 ENCOUNTER — Encounter: Payer: Self-pay | Admitting: Specialist

## 2018-11-23 VITALS — BP 154/74 | HR 72 | Ht 65.0 in | Wt 215.0 lb

## 2018-11-23 DIAGNOSIS — M5136 Other intervertebral disc degeneration, lumbar region: Secondary | ICD-10-CM | POA: Diagnosis not present

## 2018-11-23 DIAGNOSIS — M5441 Lumbago with sciatica, right side: Secondary | ICD-10-CM

## 2018-11-23 DIAGNOSIS — Z4889 Encounter for other specified surgical aftercare: Secondary | ICD-10-CM

## 2018-11-23 NOTE — Patient Instructions (Signed)
Avoid frequent bending and stooping  No lifting greater than 10 lbs. May use ice or moist heat for pain. Weight loss is of benefit. Best medication for lumbar disc disease is arthritis medications like motrin, celebrex and naprosyn. Exercise is important to improve your indurance and does allow people to function better inspite of back pain. CT Scan lumbar spine to assess the healing of the compression fracture L2 and to determine if there is nerve irritation.

## 2018-11-23 NOTE — Progress Notes (Signed)
Office Visit Note   [name]           Date of Birth: [birth year]           MRN: [id number] Visit Date: 11/23/2018              Requested by: Susy Frizzle, MD [address] [place],  [place] [address] PCP: Susy Frizzle, MD   Assessment & Plan: Visit Diagnoses:  1. Right-sided low back pain with right-sided sciatica, unspecified chronicity   2. Other intervertebral disc degeneration, lumbar region   3. Encounter for other specified surgical aftercare     Plan: Avoid frequent bending and stooping  No lifting greater than 10 lbs. May use ice or moist heat for pain. Weight loss is of benefit. Best medication for lumbar disc disease is arthritis medications like motrin, celebrex and naprosyn. Exercise is important to improve your indurance and does allow people to function better inspite of back pain. CT Scan lumbar spine to assess the healing of the compression fracture L2 and to determine if there is nerve irritation.  Follow-Up Instructions: Return in about 2 weeks (around 12/07/2018).   Orders:  Orders Placed This Encounter  Procedures  . XR Lumbar Spine 2-3 Views  . CT LUMBAR SPINE WO CONTRAST   No orders of the defined types were placed in this encounter.     Procedures: No procedures performed   Clinical Data: No additional findings.   Subjective: Chief Complaint  Patient presents with  . Lower Back - Pain    HPI  Review of Systems   Objective: Vital Signs: BP (!) 154/74 (BP Location: Left Arm, Patient Position: Sitting)   Pulse 72   Ht 5\' 5"  (1.651 m)   Wt 215 lb (97.5 kg)   BMI 35.78 kg/m   Physical Exam  Ortho Exam  Specialty Comments:  No specialty comments available.  Imaging: Xr Lumbar Spine 2-3 Views  Result Date: 11/23/2018 AP and lateral flexion and extension radiographs show the pedicle screws and rods L2 to S1 with a transition segment S1-S2. The anterior vertebtal compression at L2 with  the pedicle screws penetrating the superior end of L2. DDD L1-2 with vaacum sign. Old CT scan abdomen from 2019 shows vaacum sign at L2-3 with persistent nonunion at that segment.    PMFS History: Patient Active Problem List   Diagnosis Date Noted  . HNP (herniated nucleus pulposus), lumbar 08/22/2014    Priority: High    Class: Chronic  . Herniated nucleus pulposus, L2-3 right 02/04/2014    Priority: High    Class: Acute  . Lumbar stenosis with neurogenic claudication 04/30/2012    Priority: High    Class: Chronic  . Postsurgical hypothyroidism 06/11/2018  . Vitamin D deficiency 06/11/2018  . Multinodular goiter 06/11/2018  . Hypercalcemia 06/11/2018  . Ingrown toenail 08/22/2017  . DM type 2 causing vascular disease ([place])   . Sciatica   . PONV (postoperative nausea and vomiting)   . Peripheral neuropathy   . Osteoarthritis   . Migraines   . Irritable bowel syndrome   . Hypertension   . History of right mastectomy   . History of kidney stones   . H/O hiatal hernia   . GERD (gastroesophageal reflux disease)   . Family history of anesthesia complication   . Chronic lower back pain   . Breast cancer ([place])   . Arthritis   . Diabetes mellitus type 2, uncontrolled, with complications (  [place])   . Lower extremity edema   . Positive D dimer 03/05/2017  . Elevated d-dimer   . Elevated troponin   . Chronic constipation 06/23/2015  . Obesity 06/12/2015  . Chronic diastolic heart failure ([place]) 11/17/2014  . Urinary retention 05/04/2012    Class: Temporary  . Spondylolisthesis of lumbar region 04/30/2012    Class: Chronic  . Chest pain 03/20/2012  . Anginal pain ([place]) 03/19/2012  . Right shoulder pain 12/07/2010  . ABDOMINAL PAIN, LOWER 09/21/2009  . BACK PAIN 03/18/2009  . SYNCOPE-CAROTID SINUS 09/16/2008  . Essential hypertension, benign 08/05/2008  . Hypothyroidism 08/23/2006  . Diabetes with neurologic complications ([place]) 29/56/2130  . Diabetic neuropathy ([place])  08/23/2006  . CARDIOMYOPATHY 08/23/2006  . GERD 08/23/2006  . OSTEOARTHRITIS 08/23/2006  . DDD (degenerative disc disease), cervical 08/23/2006  . [place] DISEASE, LUMBAR 08/23/2006  . Headache(784.0) 08/23/2006  . BREAST CANCER, HX OF 08/23/2006  . HYPERPARATHYROIDISM, HX OF 08/23/2006  . RENAL CALCULUS, HX OF 08/23/2006   Past Medical History:  Diagnosis Date  . Anginal pain ([place]) 03/19/2012   saw Dr. Cathie Olden .. she thinks its more related to stomach issues  . Arthritis    "all over; mostly in my back" (03/20/2012)  . Breast cancer ([place])    "right" (03/20/2012)  . Cardiomyopathy   . Chest pain 06/2005   Hospitalized, dystolic dysfunction  . Chronic lower back pain    "have 2 herniated discs; going to have to have a fusion" (03/20/2012)  . Family history of anesthesia complication    "father has nausea and vomiting"  . Gallstones   . GERD (gastroesophageal reflux disease)   . H/O hiatal hernia   . History of kidney stones   . History of right mastectomy   . Hypertension    "patient states never had HTN, takes Hyzaar for heart  . Hypothyroidism   . Irritable bowel syndrome   . Migraines    "it's been a long time since I've had one" (03/20/2012)  . Osteoarthritis   . Peripheral neuropathy   . PONV (postoperative nausea and vomiting)   . Sciatica   . Type II diabetes mellitus ([facility])     Family History  Problem Relation Age of Onset  . Hypertension Mother   . Diabetes Father   . Hypertension Father   . Hypertension Sister   . Hypertension Brother   . Hypertension Brother   . Diabetes Brother   . Diabetes Brother   . Diabetes Other        Family Hx. of Diabetes  . Hypertension Other        Family History of HTN  . Deep vein thrombosis Daughter   . Breast cancer Maternal Aunt     Past Surgical History:  Procedure Laterality Date  . ABDOMINAL HYSTERECTOMY  1993  . adenosin cardiolite  07/2005   (+) wall motion abnormality  . AXILLARY LYMPH NODE DISSECTION  2003    squamous cell cancer  . BACK SURGERY    . CARDIAC CATHETERIZATION  ? 2005 / 2007  . CHOLECYSTECTOMY N/A 01/24/2018   Procedure: LAPAROSCOPIC CHOLECYSTECTOMY;  Surgeon: Erroll Luna, MD;  Location: [facility];  Service: General;  Laterality: N/A;  . CT abdomen and pelvis  02/2010   Same  . ESOPHAGOGASTRODUODENOSCOPY  2005   GERD  . KNEE ARTHROSCOPY  1990's   "right" (03/20/2012)  . LUMBAR FUSION N/A 08/22/2014   Procedure: Right L2-3 and right L1-2 transforaminal lumbar interbody fusion with pedicle screws,  rods, sleeves, cages, local bone graft, Vivigen, cancellous chips;  Surgeon: Jessy Oto, MD;  Location: [facility];  Service: Orthopedics;  Laterality: N/A;  . LUMBAR LAMINECTOMY/DECOMPRESSION MICRODISCECTOMY N/A 02/04/2014   Procedure: RIGHT L2-3 MICRODISCECTOMY ;  Surgeon: Jessy Oto, MD;  Location: [facility];  Service: Orthopedics;  Laterality: N/A;  . MASTECTOMY Right   . MASTECTOMY WITH AXILLARY LYMPH NODE DISSECTION  12/2003   "right" (03/20/2012)  . POSTERIOR CERVICAL FUSION/FORAMINOTOMY  2001  . SHOULDER ARTHROSCOPY W/ ROTATOR CUFF REPAIR  2003   Left  . THYROIDECTOMY  1977   Right  . TOTAL KNEE ARTHROPLASTY  2000   Right  . [place] of abdomen  07/2005   Fatty liver / kidney stones   Social History   Occupational History  . Occupation: Retired since 2003  Tobacco Use  . Smoking status: Never Smoker  . Smokeless tobacco: Never Used  Substance and Sexual Activity  . Alcohol use: No  . Drug use: No  . Sexual activity: Not on file

## 2018-12-03 ENCOUNTER — Encounter: Payer: Self-pay | Admitting: Family Medicine

## 2018-12-03 ENCOUNTER — Ambulatory Visit (INDEPENDENT_AMBULATORY_CARE_PROVIDER_SITE_OTHER): Payer: Medicare Other | Admitting: Family Medicine

## 2018-12-03 ENCOUNTER — Other Ambulatory Visit: Payer: Self-pay

## 2018-12-03 VITALS — BP 126/84 | HR 66 | Temp 97.8°F | Resp 17 | Ht 65.0 in | Wt 209.1 lb

## 2018-12-03 DIAGNOSIS — E1165 Type 2 diabetes mellitus with hyperglycemia: Secondary | ICD-10-CM

## 2018-12-03 DIAGNOSIS — E1159 Type 2 diabetes mellitus with other circulatory complications: Secondary | ICD-10-CM

## 2018-12-03 DIAGNOSIS — K219 Gastro-esophageal reflux disease without esophagitis: Secondary | ICD-10-CM

## 2018-12-03 DIAGNOSIS — I10 Essential (primary) hypertension: Secondary | ICD-10-CM

## 2018-12-03 DIAGNOSIS — I5032 Chronic diastolic (congestive) heart failure: Secondary | ICD-10-CM

## 2018-12-03 DIAGNOSIS — E118 Type 2 diabetes mellitus with unspecified complications: Secondary | ICD-10-CM | POA: Diagnosis not present

## 2018-12-03 DIAGNOSIS — Z23 Encounter for immunization: Secondary | ICD-10-CM

## 2018-12-03 DIAGNOSIS — IMO0002 Reserved for concepts with insufficient information to code with codable children: Secondary | ICD-10-CM

## 2018-12-03 MED ORDER — PANTOPRAZOLE SODIUM 40 MG PO TBEC: 40.0000 mg | DELAYED_RELEASE_TABLET | Freq: Every day | ORAL | 3 refills | Status: DC

## 2018-12-03 NOTE — Patient Instructions (Signed)
Follow up in 3-4 weeks to recheck reflux and abdominal fullness No need to repeat labs today- yay! START the Pantoprazole daily to improve reflux RESTART the Metformin- the one they are worried about is not the one that you are taking CONTINUE the Lasix (furosemide) as needed for swelling Call with any questions or concerns Welcome!  We're glad to have you!

## 2018-12-03 NOTE — Progress Notes (Signed)
   Subjective:    Patient ID: [name], female    DOB: 1950/04/14, 68 y.o.   MRN: [id number]  HPI New to establish.  Previous MD- [facility].  Endo- [facility]  DM- chronic problem, following w/ Dr Dorris Fetch.  Last visit was June 25.  On ARB.  UTD on eye exam.  Sees Podiatry Jacqualyn Posey).  On 70/30 BID.  Stopped her Metformin 'b/c I heard something bad about it'.  Did not tell Endo.  HTN- chronic problem, on Amlodipine 10mg  daily, Losartan 50mg  daily.  Well controlled today.  No CP, SOB, HAs, visual changes, edema.   Diastolic CHF- chronic problem, on Lasix PRN, on Losartan 50mg  daily.  Denies CP, SOB, Edema.  Hypothyroid- chronic problem, on Levothyroxine 138mcg daily.  Following w/ Endo.  Obesity- ongoing issue.  BMI is 34.8 and with her comorbidities this places her in morbidly obese range.   GERD- ongoing issue for pt.  Reports she is no longer able to eat a whole plate of food.  Certain foods (such as eggs) are very bothersome.  Not currently on medication.   Review of Systems For ROS see HPI     Objective:   Physical Exam Vitals signs reviewed.  Constitutional:      General: She is not in acute distress.    Appearance: She is well-developed. She is obese.  HENT:     Head: Normocephalic and atraumatic.  Eyes:     Conjunctiva/sclera: Conjunctivae normal.     Pupils: Pupils are equal, round, and reactive to light.  Neck:     Musculoskeletal: Normal range of motion and neck supple.     Thyroid: No thyromegaly.  Cardiovascular:     Rate and Rhythm: Normal rate and regular rhythm.     Heart sounds: Normal heart sounds. No murmur.  Pulmonary:     Effort: Pulmonary effort is normal. No respiratory distress.     Breath sounds: Normal breath sounds.  Abdominal:     General: There is no distension.     Palpations: Abdomen is soft.     Tenderness: There is no abdominal tenderness.  Lymphadenopathy:     Cervical: No cervical adenopathy.  Skin:    General: Skin is warm and dry.   Neurological:     Mental Status: She is alert and oriented to person, place, and time.  Psychiatric:        Behavior: Behavior normal.           Assessment & Plan:

## 2018-12-05 ENCOUNTER — Other Ambulatory Visit: Payer: Self-pay

## 2018-12-05 ENCOUNTER — Ambulatory Visit
Admission: RE | Admit: 2018-12-05 | Discharge: 2018-12-05 | Disposition: A | Discharge status: Home / Self Care | Payer: Medicare Other | Source: Ambulatory Visit | Attending: Specialist | Admitting: Specialist

## 2018-12-05 DIAGNOSIS — Z4889 Encounter for other specified surgical aftercare: Secondary | ICD-10-CM

## 2018-12-05 DIAGNOSIS — M545 Low back pain: Secondary | ICD-10-CM | POA: Diagnosis not present

## 2018-12-05 DIAGNOSIS — M5136 Other intervertebral disc degeneration, lumbar region: Secondary | ICD-10-CM

## 2018-12-05 NOTE — Assessment & Plan Note (Signed)
New to provider, ongoing for pt.  Well controlled today.  Asymptomatic.  Reviewed recent labs.  No med changes at this time.

## 2018-12-05 NOTE — Assessment & Plan Note (Signed)
Chronic problem.  Following w/ Dr Dorris Fetch.  She stopped her Metformin due to the recall of the XR formulation.  Stressed need for her to restart as this recall doesn't apply to her.  Will follow along and assist as able

## 2018-12-05 NOTE — Assessment & Plan Note (Signed)
New to provider, ongoing for pt.  On Lasix PRN.  On Losartan daily.  Currently asymptomatic.  Will continue to follow.

## 2018-12-05 NOTE — Assessment & Plan Note (Signed)
New to provider, ongoing for pt.  Given her BMI of 34.8 and her associated comorbidities, this qualifies as morbidly obese.  Stressed need for healthy diet and regular exercise.  Will follow.

## 2018-12-05 NOTE — Assessment & Plan Note (Signed)
New to provider, ongoing for pt.  Will start PPI.  Reviewed lifestyle modifications that could improve sxs.  Will continue to follow.

## 2018-12-12 ENCOUNTER — Ambulatory Visit (INDEPENDENT_AMBULATORY_CARE_PROVIDER_SITE_OTHER): Payer: Medicare Other | Admitting: Specialist

## 2018-12-12 ENCOUNTER — Encounter: Payer: Self-pay | Admitting: Specialist

## 2018-12-12 VITALS — BP 162/87 | HR 78 | Ht 65.0 in | Wt 215.0 lb

## 2018-12-12 DIAGNOSIS — M4005 Postural kyphosis, thoracolumbar region: Secondary | ICD-10-CM | POA: Diagnosis not present

## 2018-12-12 DIAGNOSIS — M4807 Spinal stenosis, lumbosacral region: Secondary | ICD-10-CM

## 2018-12-12 DIAGNOSIS — T84498A Other mechanical complication of other internal orthopedic devices, implants and grafts, initial encounter: Secondary | ICD-10-CM | POA: Diagnosis not present

## 2018-12-12 DIAGNOSIS — Z981 Arthrodesis status: Secondary | ICD-10-CM

## 2018-12-12 DIAGNOSIS — M5136 Other intervertebral disc degeneration, lumbar region: Secondary | ICD-10-CM

## 2018-12-12 DIAGNOSIS — M51369 Other intervertebral disc degeneration, lumbar region without mention of lumbar back pain or lower extremity pain: Secondary | ICD-10-CM

## 2018-12-12 NOTE — Patient Instructions (Signed)
Avoid frequent bending and stooping  No lifting greater than 10 lbs. May use ice or moist heat for pain. Weight loss is of benefit. Best medication for lumbar disc disease is arthritis medications like motrin, celebrex and naprosyn. Exercise is important to improve your indurance and does allow people to function better inspite of back pain.  Avoid bending, stooping and avoid lifting weights greater than 10 lbs. Avoid prolong standing and walking. Order for a new walker with wheels. Surgery scheduling secretary Kandice Hams, will call you in the next week to schedule for surgery.  Surgery recommended is a six level Thoracolumbar fusion T8 to T12, T12-L1  and L1-2 this would be done with rods, screws and cages with local bone graft and allograft (donor bone graft). Cages at the T12-L1 level and L1-2 with redo interbody fusion L2-3. Take hydrocodone for for pain. Risk of surgery includes risk of infection 1 in 100 patientswith diabetes, bleeding 1/2% chance you would need a transfusion.   Risk to the nerves is one in 10,000. You will need to use a brace for 3 months and wean from the brace on the 4th month. Expect improved walking and standing tolerance. Expect relief of leg pain but numbness may persist depending on the length and degree of pressure that has been present.

## 2018-12-12 NOTE — Progress Notes (Signed)
Office Visit Note   [name]           Date of Birth: [birth year]           MRN: [id number] Visit Date: 12/12/2018              Requested by: Susy Frizzle, MD [address] [place],  [place] [address] PCP: [name], MD   Assessment & Plan: Visit Diagnoses:  1. Loosening of hardware in spine ([place])   2. Other intervertebral disc degeneration, lumbar region   3. Spinal stenosis of lumbosacral region   4. Postural kyphosis of thoracolumbar region     Plan: Avoid frequent bending and stooping  No lifting greater than 10 lbs. May use ice or moist heat for pain. Weight loss is of benefit. Best medication for lumbar disc disease is arthritis medications like motrin, celebrex and naprosyn. Exercise is important to improve your indurance and does allow people to function better inspite of back pain.  Avoid bending, stooping and avoid lifting weights greater than 10 lbs. Avoid prolong standing and walking. Order for a new walker with wheels. Surgery scheduling secretary Kandice Hams, will call you in the next week to schedule for surgery.  Surgery recommended is a six level Thoracolumbar fusion T8 to T12, T12-L1  and L1-2 this would be done with rods, screws and cages with local bone graft and allograft (donor bone graft). Cages at the T12-L1 level and L1-2 with redo interbody fusion L2-3. Take hydrocodone for for pain. Risk of surgery includes risk of infection 1 in 100 patientswith diabetes, bleeding 1/2% chance you would need a transfusion.   Risk to the nerves is one in 10,000. You will need to use a brace for 3 months and wean from the brace on the 4th month. Expect improved walking and standing tolerance. Expect relief of leg pain but numbness may persist depending on the length and degree of pressure that has been present.  Follow-Up Instructions: Return in about 3 weeks (around 01/02/2019).   Orders:  No orders of the defined types  were placed in this encounter.  No orders of the defined types were placed in this encounter.     Procedures: No procedures performed   Clinical Data: No additional findings.   Subjective: Chief Complaint  Patient presents with  . Lower Back - Follow-up    CT review    68 year old female with persistent back pain and radiating into the right anterior thigh greater than left. Pain in the buttocks and into the thighs. No bladder difficulty, has constipation. 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 She is at a point that she does not want to hurt this way. She went on a trip to [place] and up to the mountains and was hurting so bad that she couldn't do anything.     Review of Systems  Constitutional: Negative.   HENT: Negative.   Eyes: Negative.   Respiratory: Negative.   Cardiovascular: Negative.   Gastrointestinal: Negative.   Endocrine: Negative.   Genitourinary: Negative.   Musculoskeletal: Negative.   Skin: Negative.   Allergic/Immunologic: Negative.   Neurological: Negative.   Hematological: Negative.   Psychiatric/Behavioral: Negative.      Objective: Vital Signs: BP (!) 162/87 (BP Location: Left Arm, Patient Position: Sitting)   Pulse 78   Ht 5'  5" (1.651 m)   Wt 215 lb (97.5 kg)   BMI 35.78 kg/m   Physical Exam Constitutional:      Appearance: She is well-developed.  HENT:     Head: Normocephalic and atraumatic.  Eyes:     Pupils: Pupils are equal, round, and reactive to light.  Neck:     Musculoskeletal: Normal range of motion and neck supple.  Pulmonary:     Effort: Pulmonary effort is normal.     Breath sounds: Normal breath sounds.  Abdominal:     General: Bowel sounds are normal.     Palpations: Abdomen is soft.  Musculoskeletal: Normal range of motion.  Skin:    General: Skin is warm and dry.  Neurological:     Mental Status: She is alert and  oriented to person, place, and time.  Psychiatric:        Behavior: Behavior normal.        Thought Content: Thought content normal.        Judgment: Judgment normal.     Ortho Exam  Specialty Comments:  No specialty comments available.  Imaging: No results found.   PMFS History: Patient Active Problem List   Diagnosis Date Noted  . Herniated nucleus pulposus, L2-3 right 02/04/2014    Priority: High    Class: Acute  . Lumbar stenosis with neurogenic claudication 04/30/2012    Priority: High    Class: Chronic  . Postsurgical hypothyroidism 06/11/2018  . Vitamin D deficiency 06/11/2018  . Multinodular goiter 06/11/2018  . Hypercalcemia 06/11/2018  . DM type 2 causing vascular disease ([place])   . PONV (postoperative nausea and vomiting)   . Peripheral neuropathy   . Irritable bowel syndrome   . Hypertension   . History of right mastectomy   . H/O hiatal hernia   . GERD (gastroesophageal reflux disease)   . Family history of anesthesia complication   . Diabetes mellitus type 2, uncontrolled, with complications ([place])   . Lower extremity edema   . Elevated d-dimer   . Elevated troponin   . Chronic constipation 06/23/2015  . Morbid obesity ([place]) 06/12/2015  . Chronic diastolic heart failure ([place]) 11/17/2014  . Urinary retention 05/04/2012    Class: Temporary  . Spondylolisthesis of lumbar region 04/30/2012    Class: Chronic  . Anginal pain ([place]) 03/19/2012  . SYNCOPE-CAROTID SINUS 09/16/2008  . Essential hypertension, benign 08/05/2008  . OSTEOARTHRITIS 08/23/2006  . DDD (degenerative disc disease), cervical 08/23/2006  . [place] DISEASE, LUMBAR 08/23/2006  . Headache(784.0) 08/23/2006  . BREAST CANCER, HX OF 08/23/2006  . HYPERPARATHYROIDISM, HX OF 08/23/2006  . RENAL CALCULUS, HX OF 08/23/2006   Past Medical History:  Diagnosis Date  . Anginal pain ([place]) 03/19/2012   saw Dr. Cathie Olden .. she thinks its more related to stomach issues  . Arthritis    "all over;  mostly in my back" (03/20/2012)  . Breast cancer ([place])    "right" (03/20/2012)  . Cardiomyopathy   . Chest pain 06/2005   Hospitalized, dystolic dysfunction  . Chronic lower back pain    "have 2 herniated discs; going to have to have a fusion" (03/20/2012)  . Family history of anesthesia complication    "father has nausea and vomiting"  . Gallstones   . GERD (gastroesophageal reflux disease)   . H/O hiatal hernia   . History of kidney stones   . History of right mastectomy   . Hypertension    "patient states never had HTN, takes Hyzaar for  heart  . Hypothyroidism   . Irritable bowel syndrome   . Migraines    "it's been a long time since I've had one" (03/20/2012)  . Osteoarthritis   . Peripheral neuropathy   . PONV (postoperative nausea and vomiting)   . Sciatica   . Type II diabetes mellitus ([facility])     Family History  Problem Relation Age of Onset  . Hypertension Mother   . Diabetes Father   . Hypertension Father   . Hypertension Sister   . Hypertension Brother   . Hypertension Brother   . Diabetes Brother   . Diabetes Brother   . Diabetes Other        Family Hx. of Diabetes  . Hypertension Other        Family History of HTN  . Deep vein thrombosis Daughter   . Breast cancer Maternal Aunt     Past Surgical History:  Procedure Laterality Date  . ABDOMINAL HYSTERECTOMY  1993  . adenosin cardiolite  07/2005   (+) wall motion abnormality  . AXILLARY LYMPH NODE DISSECTION  2003   squamous cell cancer  . BACK SURGERY    . CARDIAC CATHETERIZATION  ? 2005 / 2007  . CHOLECYSTECTOMY N/A 01/24/2018   Procedure: LAPAROSCOPIC CHOLECYSTECTOMY;  Surgeon: Erroll Luna, MD;  Location: [facility];  Service: General;  Laterality: N/A;  . CT abdomen and pelvis  02/2010   Same  . ESOPHAGOGASTRODUODENOSCOPY  2005   GERD  . KNEE ARTHROSCOPY  1990's   "right" (03/20/2012)  . LUMBAR FUSION N/A 08/22/2014   Procedure: Right L2-3 and right L1-2 transforaminal lumbar interbody fusion  with pedicle screws, rods, sleeves, cages, local bone graft, Vivigen, cancellous chips;  Surgeon: Jessy Oto, MD;  Location: [facility];  Service: Orthopedics;  Laterality: N/A;  . LUMBAR LAMINECTOMY/DECOMPRESSION MICRODISCECTOMY N/A 02/04/2014   Procedure: RIGHT L2-3 MICRODISCECTOMY ;  Surgeon: Jessy Oto, MD;  Location: [facility];  Service: Orthopedics;  Laterality: N/A;  . MASTECTOMY Right   . MASTECTOMY WITH AXILLARY LYMPH NODE DISSECTION  12/2003   "right" (03/20/2012)  . POSTERIOR CERVICAL FUSION/FORAMINOTOMY  2001  . SHOULDER ARTHROSCOPY W/ ROTATOR CUFF REPAIR  2003   Left  . THYROIDECTOMY  1977   Right  . TOTAL KNEE ARTHROPLASTY  2000   Right  . [place] of abdomen  07/2005   Fatty liver / kidney stones   Social History   Occupational History  . Occupation: Retired since 2003  Tobacco Use  . Smoking status: Never Smoker  . Smokeless tobacco: Never Used  Substance and Sexual Activity  . Alcohol use: No  . Drug use: No  . Sexual activity: Not on file

## 2018-12-25 ENCOUNTER — Telehealth: Payer: Self-pay | Admitting: *Deleted

## 2018-12-25 ENCOUNTER — Other Ambulatory Visit: Payer: Self-pay

## 2018-12-25 ENCOUNTER — Ambulatory Visit (HOSPITAL_COMMUNITY)
Admission: RE | Admit: 2018-12-25 | Discharge: 2018-12-25 | Disposition: A | Discharge status: Home / Self Care | Payer: Medicare Other | Source: Ambulatory Visit | Attending: Cardiovascular Disease | Admitting: Cardiovascular Disease

## 2018-12-25 ENCOUNTER — Ambulatory Visit (INDEPENDENT_AMBULATORY_CARE_PROVIDER_SITE_OTHER): Payer: Medicare Other

## 2018-12-25 ENCOUNTER — Ambulatory Visit (INDEPENDENT_AMBULATORY_CARE_PROVIDER_SITE_OTHER): Payer: Medicare Other | Admitting: Podiatry

## 2018-12-25 DIAGNOSIS — M79661 Pain in right lower leg: Secondary | ICD-10-CM

## 2018-12-25 DIAGNOSIS — M79676 Pain in unspecified toe(s): Secondary | ICD-10-CM

## 2018-12-25 DIAGNOSIS — E0843 Diabetes mellitus due to underlying condition with diabetic autonomic (poly)neuropathy: Secondary | ICD-10-CM

## 2018-12-25 DIAGNOSIS — R609 Edema, unspecified: Secondary | ICD-10-CM

## 2018-12-25 DIAGNOSIS — M19079 Primary osteoarthritis, unspecified ankle and foot: Secondary | ICD-10-CM | POA: Diagnosis not present

## 2018-12-25 DIAGNOSIS — M79671 Pain in right foot: Secondary | ICD-10-CM

## 2018-12-25 DIAGNOSIS — I739 Peripheral vascular disease, unspecified: Secondary | ICD-10-CM | POA: Diagnosis not present

## 2018-12-25 DIAGNOSIS — B351 Tinea unguium: Secondary | ICD-10-CM | POA: Diagnosis not present

## 2018-12-25 DIAGNOSIS — G629 Polyneuropathy, unspecified: Secondary | ICD-10-CM | POA: Diagnosis not present

## 2018-12-25 DIAGNOSIS — M779 Enthesopathy, unspecified: Secondary | ICD-10-CM | POA: Diagnosis not present

## 2018-12-25 DIAGNOSIS — E1142 Type 2 diabetes mellitus with diabetic polyneuropathy: Secondary | ICD-10-CM

## 2018-12-25 DIAGNOSIS — M79672 Pain in left foot: Secondary | ICD-10-CM

## 2018-12-25 MED ORDER — PREGABALIN 75 MG PO CAPS: 75.0000 mg | ORAL_CAPSULE | Freq: Two times a day (BID) | ORAL | 0 refills | Status: DC

## 2018-12-25 NOTE — Telephone Encounter (Signed)
[facility] states can test pt today at 4:00pm. Faxed orders to [facility] and informed pt.

## 2018-12-25 NOTE — Patient Instructions (Signed)
Pregabalin capsules What is this medicine? PREGABALIN (pre GAB a lin) is used to treat nerve pain from diabetes, shingles, spinal cord injury, and fibromyalgia. It is also used to control seizures in epilepsy. This medicine may be used for other purposes; ask your health care provider or pharmacist if you have questions. COMMON BRAND NAME(S): Lyrica What should I tell my health care provider before I take this medicine? They need to know if you have any of these conditions:  heart disease  history of drug abuse or alcohol abuse problem  kidney disease  lung or breathing disease  suicidal thoughts, plans, or attempt; a previous suicide attempt by you or a family member  an unusual or allergic reaction to pregabalin, gabapentin, other medicines, foods, dyes, or preservatives  pregnant or trying to get pregnant  breast-feeding How should I use this medicine? Take this medicine by mouth with a glass of water. Follow the directions on the prescription label. You can take it with or without food. If it upsets your stomach, take it with food. Take your medicine at regular intervals. Do not take it more often than directed. Do not stop taking except on your doctor's advice. A special MedGuide will be given to you by the pharmacist with each prescription and refill. Be sure to read this information carefully each time. Talk to your pediatrician regarding the use of this medicine in children. While this drug may be prescribed for children as young as 1 month for selected conditions, precautions do apply. Overdosage: If you think you have taken too much of this medicine contact a poison control center or emergency room at once. NOTE: This medicine is only for you. Do not share this medicine with others. What if I miss a dose? If you miss a dose, take it as soon as you can. If it is almost time for your next dose, take only that dose. Do not take double or extra doses. What may interact with this  medicine? This medicine may interact with the following medications:  alcohol  antihistamines for allergy, cough, and cold  certain medicines for anxiety or sleep  certain medicines for depression like amitriptyline, fluoxetine, sertraline  certain medicines for diabetes  certain medicines for seizures like phenobarbital, primidone  general anesthetics like halothane, isoflurane, methoxyflurane, propofol  local anesthetics like lidocaine, pramoxine, tetracaine  medicines that relax muscles for surgery  narcotic medicines for pain  phenothiazines like chlorpromazine, mesoridazine, prochlorperazine, thioridazine This list may not describe all possible interactions. Give your health care provider a list of all the medicines, herbs, non-prescription drugs, or dietary supplements you use. Also tell them if you smoke, drink alcohol, or use illegal drugs. Some items may interact with your medicine. What should I watch for while using this medicine? Tell your doctor or healthcare professional if your symptoms do not start to get better or if they get worse. Visit your doctor or health care professional for regular checks on your progress. Do not stop taking except on your doctor's advice. You may develop a severe reaction. Your doctor will tell you how much medicine to take. Wear a medical identification bracelet or chain if you are taking this medicine for seizures, and carry a card that describes your disease and details of your medicine and dosage times. You may get drowsy or dizzy. Do not drive, use machinery, or do anything that needs mental alertness until you know how this medicine affects you. Do not stand or sit up quickly, especially if   you are an older patient. This reduces the risk of dizzy or fainting spells. Alcohol may interfere with the effect of this medicine. Avoid alcoholic drinks. If you have a heart condition, like congestive heart failure, and notice that you are retaining  water and have swelling in your hands or feet, contact your health care provider immediately. The use of this medicine may increase the chance of suicidal thoughts or actions. Pay special attention to how you are responding while on this medicine. Any worsening of mood, or thoughts of suicide or dying should be reported to your health care professional right away. This medicine has caused reduced sperm counts in some men. This may interfere with the ability to father a child. You should talk to your doctor or health care professional if you are concerned about your fertility. Women who become pregnant while using this medicine for seizures may enroll in the [place] Antiepileptic Drug Pregnancy Registry by calling 1-888-233-2334. This registry collects information about the safety of antiepileptic drug use during pregnancy. What side effects may I notice from receiving this medicine? Side effects that you should report to your doctor or health care professional as soon as possible:  allergic reactions like skin rash, itching or hives, swelling of the face, lips, or tongue  breathing problems  changes in vision  chest pain  confusion  jerking or unusual movements of any part of your body  loss of memory  muscle pain, tenderness, or weakness  suicidal thoughts or other mood changes  swelling of the ankles, feet, hands  unusual bruising or bleeding Side effects that usually do not require medical attention (report to your doctor or health care professional if they continue or are bothersome):  dizziness  drowsiness  dry mouth  headache  nausea  tremors  trouble sleeping  weight gain This list may not describe all possible side effects. Call your doctor for medical advice about side effects. You may report side effects to FDA at 1-800-FDA-1088. Where should I keep my medicine? Keep out of the reach of children. This medicine can be abused. Keep your medicine in a safe  place to protect it from theft. Do not share this medicine with anyone. Selling or giving away this medicine is dangerous and against the law. This medicine may cause accidental overdose and death if it taken by other adults, children, or pets. Mix any unused medicine with a substance like cat litter or coffee grounds. Then throw the medicine away in a sealed container like a sealed bag or a coffee can with a lid. Do not use the medicine after the expiration date. Store at room temperature between 15 and 30 degrees C (59 and 86 degrees F). NOTE: This sheet is a summary. It may not cover all possible information. If you have questions about this medicine, talk to your doctor, pharmacist, or health care provider.  2020 [employer]vier/Gold Standard (2018-03-30 13:15:55)  

## 2018-12-26 ENCOUNTER — Other Ambulatory Visit: Payer: Self-pay | Admitting: Podiatry

## 2018-12-26 DIAGNOSIS — M779 Enthesopathy, unspecified: Secondary | ICD-10-CM

## 2018-12-26 DIAGNOSIS — G5791 Unspecified mononeuropathy of right lower limb: Secondary | ICD-10-CM

## 2018-12-26 NOTE — Progress Notes (Signed)
Subjective: 68 year old female presents the office today for concerns of bilateral foot pain, swelling the right side worse than left.  She is in the last couple days her right leg is become very painful.  She previously did have arterial studies as well as venous studies performed earlier this year and she was negative for DVT.  She also states her nails were thickened elongated she cannot trim them herself.  She is also describing sharp, burning pain to her feet at nighttime.  She previously was on gabapentin but she has not been taking this.  She denies any recent injury to her feet. Denies any systemic complaints such as fevers, chills, nausea, vomiting. No acute changes since last appointment, and no other complaints at this time.   Objective: AAO x3, NAD DP/PT pulses palpable bilaterally, CRT less than 3 seconds Chronic edema to bilateral lower extremities with the right side worse than left to the entire extremity.  There is pain with calf compression.  There is discoloration of the back of the right ankle likely from swelling.  There is no area pinpoint tenderness noted. Nails are hypertrophic, dystrophic, brittle, discolored, elongated 10. No surrounding redness or drainage. Tenderness nails 1-5 bilaterally. No open lesions or pre-ulcerative lesions are identified today.  Assessment: Bilateral lower extremity edema, rule out DVT; symptomatic onychomycosis; neuropathy  Plan: -All treatment options discussed with the patient including all alternatives, risks, complications.  -X-rays obtained reviewed.  No evidence of acute fracture.  Arthritic changes present bilaterally. -Venous duplex today to rule out DVT given acute onset of right leg pain.Discussed Unna boots as well as compression socks.  Elevation. -As she has not been taking gabapentin or switch to Lyrica.  Discussed side effects medication.  This was sent to her pharmacy today. -Nails debrided x10 without any complications or  bleeding. -Patient encouraged to call the office with any questions, concerns, change in symptoms.   [name] DPM

## 2018-12-27 ENCOUNTER — Encounter: Payer: Self-pay | Admitting: Family Medicine

## 2018-12-27 ENCOUNTER — Ambulatory Visit (INDEPENDENT_AMBULATORY_CARE_PROVIDER_SITE_OTHER): Payer: Medicare Other | Admitting: Family Medicine

## 2018-12-27 ENCOUNTER — Other Ambulatory Visit: Payer: Self-pay

## 2018-12-27 VITALS — BP 124/83 | HR 78 | Temp 97.9°F | Resp 16 | Ht 65.0 in | Wt 218.5 lb

## 2018-12-27 DIAGNOSIS — R1310 Dysphagia, unspecified: Secondary | ICD-10-CM

## 2018-12-27 DIAGNOSIS — M792 Neuralgia and neuritis, unspecified: Secondary | ICD-10-CM | POA: Insufficient documentation

## 2018-12-27 DIAGNOSIS — K219 Gastro-esophageal reflux disease without esophagitis: Secondary | ICD-10-CM | POA: Diagnosis not present

## 2018-12-27 MED ORDER — PANTOPRAZOLE SODIUM 40 MG PO TBEC: 40.0000 mg | DELAYED_RELEASE_TABLET | Freq: Two times a day (BID) | ORAL | 3 refills | Status: DC

## 2018-12-27 NOTE — Patient Instructions (Signed)
Schedule your complete physical in 6 months We'll call you with your GI appt INCREASE the Pantoprazole (acid medication) to twice daily The Lyrica should help with the pain you have on your R flank Continue to take small bites Eat frequently throughout the day and avoid large meals if possible Call with any questions or concerns Hang in there!!!

## 2018-12-27 NOTE — Assessment & Plan Note (Signed)
Chronic problem.  Mild relief w/ Protonix.  Will increase to twice daily.  Suspect there may be a degree of gastroparesis.  Will refer to GI for complete evaluation.

## 2018-12-27 NOTE — Assessment & Plan Note (Signed)
New to provider, ongoing for pt.  Suspect this is due to her previous surgery (mastectomy).  She had stopped her gabapentin and sxs subsequently worsened.  Recently started on Lyrica by podiatry which should help this situation.  Pt pleased w/ the possibility

## 2018-12-27 NOTE — Assessment & Plan Note (Signed)
New.  Pt reports she has to take very small bites or feels that food is stuck 'the whole way down'.  Will refer to GI for complete evaluation.  Pt expressed understanding and is in agreement w/ plan.

## 2018-12-27 NOTE — Progress Notes (Signed)
   Subjective:    Patient ID: [name], female    DOB: 1950-08-15, 68 y.o.   MRN: [id number]  HPI GI issues- pt reports sxs are worse after eating.  'feels like all my food is stuck right here' (at [facility])  Has had occasional vomiting but often feels that she needs to vomit.  Pt reports coughing while eating.  Only mild relief since starting Pantoprazole.  + dysphagia unless taking very small bites.  Will have abd pressure and bloating  R flank pain- same side as mastectomy.  No rash present.  Pt reports pain has been present since mastectomy, 'and nobody can tell me what it is'.  Pain is 'like lightning'.  Recently started on Lyrica by Podiatrist   Review of Systems For ROS see HPI     Objective:   Physical Exam Vitals signs reviewed.  Constitutional:      General: She is not in acute distress.    Appearance: Normal appearance. She is obese.  HENT:     Head: Normocephalic and atraumatic.  Abdominal:     General: There is distension (mild).     Tenderness: There is no abdominal tenderness. There is no guarding or rebound.  Musculoskeletal:        General: No swelling (no swelling of R flank or mastectomy site), tenderness (no TTP over R flank) or deformity.  Skin:    General: Skin is warm and dry.     Findings: No rash.  Neurological:     General: No focal deficit present.     Mental Status: She is alert and oriented to person, place, and time.  Psychiatric:        Mood and Affect: Mood normal.        Behavior: Behavior normal.        Thought Content: Thought content normal.           Assessment & Plan:

## 2018-12-28 ENCOUNTER — Telehealth: Payer: Self-pay | Admitting: *Deleted

## 2018-12-28 NOTE — Telephone Encounter (Signed)
-----   Message from [name], DPM sent at 12/26/2018  4:19 PM EDT ----- Negative for DVT- please have her wear compression socks. She can also come in for unna boots.

## 2018-12-28 NOTE — Telephone Encounter (Signed)
I informed pt of Dr. Leigh Aurora review of results and orders. Pt states she has been wearing the compression socks since her visit, but felt the right leg was not going down quickly, and would like the unna boot. I instructed pt to continue the compression socks and we could get her in early next week. Pt agreed and I transferred to scheduler.

## 2019-01-01 ENCOUNTER — Encounter: Payer: Self-pay | Admitting: Family Medicine

## 2019-01-01 ENCOUNTER — Ambulatory Visit: Payer: Medicare Other

## 2019-01-02 ENCOUNTER — Encounter: Payer: Self-pay | Admitting: General Practice

## 2019-01-02 DIAGNOSIS — M549 Dorsalgia, unspecified: Secondary | ICD-10-CM

## 2019-01-03 ENCOUNTER — Other Ambulatory Visit: Payer: Self-pay

## 2019-01-03 ENCOUNTER — Ambulatory Visit (INDEPENDENT_AMBULATORY_CARE_PROVIDER_SITE_OTHER): Payer: Medicare Other | Admitting: Podiatry

## 2019-01-03 DIAGNOSIS — R609 Edema, unspecified: Secondary | ICD-10-CM

## 2019-01-03 NOTE — Progress Notes (Signed)
Bilateral unna boots applied for lower extremity edema. Precautions advised on when to remove this.

## 2019-01-07 ENCOUNTER — Other Ambulatory Visit (HOSPITAL_COMMUNITY): Payer: Self-pay | Admitting: Podiatry

## 2019-01-08 ENCOUNTER — Other Ambulatory Visit: Payer: Self-pay

## 2019-01-08 ENCOUNTER — Ambulatory Visit: Payer: Medicare Other | Admitting: Podiatry

## 2019-01-08 ENCOUNTER — Telehealth: Payer: Self-pay | Admitting: *Deleted

## 2019-01-08 DIAGNOSIS — E0843 Diabetes mellitus due to underlying condition with diabetic autonomic (poly)neuropathy: Secondary | ICD-10-CM

## 2019-01-08 DIAGNOSIS — M19079 Primary osteoarthritis, unspecified ankle and foot: Secondary | ICD-10-CM | POA: Diagnosis not present

## 2019-01-08 DIAGNOSIS — R609 Edema, unspecified: Secondary | ICD-10-CM | POA: Diagnosis not present

## 2019-01-08 NOTE — Telephone Encounter (Signed)
Pt called and wanted to know what the status is on her MRI that was supposed to have been ordered on 12/12/18, I looked in pt's chart and nothing was mentioned about getting MRI but was mentioned Surgery, pt is pretty upset because she is in pain and wants something done.   Do you want pt to have MRI of spine or is surgery in place? Please advise.

## 2019-01-09 ENCOUNTER — Other Ambulatory Visit: Payer: Self-pay | Admitting: Family Medicine

## 2019-01-09 NOTE — Progress Notes (Signed)
Subjective: 68 year old female presents the office today for evaluation of bilateral leg swelling.  She is been wearing Unna boots and they have been helpful.  She states that she still gets some discomfort in the top of her left foot.  Also she states that the Lyrica has been helpful.  She thinks the neuropathy symptoms are improving.  She also stopped the calluses to both of her big toes as a cause pain. Denies any systemic complaints such as fevers, chills, nausea, vomiting. No acute changes since last appointment, and no other complaints at this time.   Objective: AAO x3, NAD DP/PT pulses palpable bilaterally, CRT less than 3 seconds Hyperkeratotic lesions medial hallux IPJ without any underlying ulceration drainage or any signs of infection.  Chronic swelling is present bilaterally.  There is very mild increase in swelling to the left dorsal midfoot compared to contra extremity with no erythema or warmth.  No area pinpoint tenderness there is mild diffuse tenderness on the dorsal midfoot. No pain with calf compression, swelling, warmth, erythema  Assessment: Left midfoot pain, tendinitis/capsulitis, neuropathy, chronic swelling  Plan: -All treatment options discussed with the patient including all alternatives, risks, complications.  -I would like for her to wear compression socks.  Encouraged elevation.  Debrided the hyperkeratotic lesions but any complications or bleeding.  Continue current dose of Lyrica but discussed possibly increasing the dose if needed.  She has Voltaren gel at home I want her to buy topically the left foot as well as ice the area daily.  Also more supportive shoes.  She has diabetic shoes but they are too big.  She can bring with her next appointment. -Patient encouraged to call the office with any questions, concerns, change in symptoms.   [name] DPM

## 2019-01-10 ENCOUNTER — Other Ambulatory Visit: Payer: Self-pay | Admitting: Radiology

## 2019-01-10 DIAGNOSIS — M4807 Spinal stenosis, lumbosacral region: Secondary | ICD-10-CM

## 2019-01-10 DIAGNOSIS — M5136 Other intervertebral disc degeneration, lumbar region: Secondary | ICD-10-CM

## 2019-01-10 NOTE — Telephone Encounter (Signed)
I put an order in for urgent MRI

## 2019-01-14 ENCOUNTER — Other Ambulatory Visit: Payer: Self-pay

## 2019-01-14 ENCOUNTER — Ambulatory Visit: Payer: Medicare Other | Admitting: Orthotics

## 2019-01-14 DIAGNOSIS — E0843 Diabetes mellitus due to underlying condition with diabetic autonomic (poly)neuropathy: Secondary | ICD-10-CM

## 2019-01-14 DIAGNOSIS — M2010 Hallux valgus (acquired), unspecified foot: Secondary | ICD-10-CM

## 2019-01-14 DIAGNOSIS — M19079 Primary osteoarthritis, unspecified ankle and foot: Secondary | ICD-10-CM

## 2019-01-14 NOTE — Progress Notes (Signed)

## 2019-01-26 ENCOUNTER — Ambulatory Visit
Admission: RE | Admit: 2019-01-26 | Discharge: 2019-01-26 | Disposition: A | Discharge status: Home / Self Care | Payer: Medicare Other | Source: Ambulatory Visit | Attending: Specialist | Admitting: Specialist

## 2019-01-26 ENCOUNTER — Other Ambulatory Visit: Payer: Self-pay

## 2019-01-26 DIAGNOSIS — M545 Low back pain: Secondary | ICD-10-CM | POA: Diagnosis not present

## 2019-01-26 DIAGNOSIS — M4807 Spinal stenosis, lumbosacral region: Secondary | ICD-10-CM

## 2019-01-26 DIAGNOSIS — M5136 Other intervertebral disc degeneration, lumbar region: Secondary | ICD-10-CM

## 2019-01-28 DIAGNOSIS — M6281 Muscle weakness (generalized): Secondary | ICD-10-CM | POA: Diagnosis not present

## 2019-01-28 DIAGNOSIS — M961 Postlaminectomy syndrome, not elsewhere classified: Secondary | ICD-10-CM | POA: Diagnosis not present

## 2019-01-28 DIAGNOSIS — Z981 Arthrodesis status: Secondary | ICD-10-CM | POA: Diagnosis not present

## 2019-01-28 DIAGNOSIS — M5136 Other intervertebral disc degeneration, lumbar region: Secondary | ICD-10-CM | POA: Diagnosis not present

## 2019-01-28 DIAGNOSIS — M5416 Radiculopathy, lumbar region: Secondary | ICD-10-CM | POA: Diagnosis not present

## 2019-01-28 DIAGNOSIS — M545 Low back pain: Secondary | ICD-10-CM | POA: Diagnosis not present

## 2019-01-28 DIAGNOSIS — M960 Pseudarthrosis after fusion or arthrodesis: Secondary | ICD-10-CM | POA: Diagnosis not present

## 2019-01-30 ENCOUNTER — Encounter: Payer: Self-pay | Admitting: Family Medicine

## 2019-02-04 ENCOUNTER — Ambulatory Visit: Payer: Medicare Other | Admitting: "Endocrinology

## 2019-02-06 DIAGNOSIS — M6281 Muscle weakness (generalized): Secondary | ICD-10-CM | POA: Diagnosis not present

## 2019-02-06 DIAGNOSIS — M5416 Radiculopathy, lumbar region: Secondary | ICD-10-CM | POA: Diagnosis not present

## 2019-02-06 DIAGNOSIS — M545 Low back pain: Secondary | ICD-10-CM | POA: Diagnosis not present

## 2019-02-07 ENCOUNTER — Ambulatory Visit: Payer: Medicare Other | Admitting: Podiatry

## 2019-02-11 ENCOUNTER — Other Ambulatory Visit: Payer: Self-pay | Admitting: Podiatry

## 2019-02-12 NOTE — Telephone Encounter (Signed)
Left message with Lyrica orders.

## 2019-02-13 DIAGNOSIS — M5416 Radiculopathy, lumbar region: Secondary | ICD-10-CM | POA: Diagnosis not present

## 2019-02-13 DIAGNOSIS — M545 Low back pain: Secondary | ICD-10-CM | POA: Diagnosis not present

## 2019-02-13 DIAGNOSIS — M6281 Muscle weakness (generalized): Secondary | ICD-10-CM | POA: Diagnosis not present

## 2019-02-14 DIAGNOSIS — M6281 Muscle weakness (generalized): Secondary | ICD-10-CM | POA: Diagnosis not present

## 2019-02-14 DIAGNOSIS — M545 Low back pain: Secondary | ICD-10-CM | POA: Diagnosis not present

## 2019-02-14 DIAGNOSIS — M5416 Radiculopathy, lumbar region: Secondary | ICD-10-CM | POA: Diagnosis not present

## 2019-02-20 DIAGNOSIS — M6281 Muscle weakness (generalized): Secondary | ICD-10-CM | POA: Diagnosis not present

## 2019-02-20 DIAGNOSIS — M5416 Radiculopathy, lumbar region: Secondary | ICD-10-CM | POA: Diagnosis not present

## 2019-02-20 DIAGNOSIS — M545 Low back pain: Secondary | ICD-10-CM | POA: Diagnosis not present

## 2019-02-22 DIAGNOSIS — M545 Low back pain: Secondary | ICD-10-CM | POA: Diagnosis not present

## 2019-02-22 DIAGNOSIS — M5416 Radiculopathy, lumbar region: Secondary | ICD-10-CM | POA: Diagnosis not present

## 2019-02-22 DIAGNOSIS — M6281 Muscle weakness (generalized): Secondary | ICD-10-CM | POA: Diagnosis not present

## 2019-02-25 ENCOUNTER — Other Ambulatory Visit: Payer: Self-pay | Admitting: Family Medicine

## 2019-02-27 DIAGNOSIS — M545 Low back pain: Secondary | ICD-10-CM | POA: Diagnosis not present

## 2019-02-27 DIAGNOSIS — M5416 Radiculopathy, lumbar region: Secondary | ICD-10-CM | POA: Diagnosis not present

## 2019-02-27 DIAGNOSIS — M6281 Muscle weakness (generalized): Secondary | ICD-10-CM | POA: Diagnosis not present

## 2019-03-01 DIAGNOSIS — M545 Low back pain: Secondary | ICD-10-CM | POA: Diagnosis not present

## 2019-03-01 DIAGNOSIS — M5416 Radiculopathy, lumbar region: Secondary | ICD-10-CM | POA: Diagnosis not present

## 2019-03-01 DIAGNOSIS — E1159 Type 2 diabetes mellitus with other circulatory complications: Secondary | ICD-10-CM | POA: Diagnosis not present

## 2019-03-01 DIAGNOSIS — E890 Postprocedural hypothyroidism: Secondary | ICD-10-CM | POA: Diagnosis not present

## 2019-03-01 DIAGNOSIS — M6281 Muscle weakness (generalized): Secondary | ICD-10-CM | POA: Diagnosis not present

## 2019-03-02 LAB — TSH: TSH: 2.13 mIU/L (ref 0.40–4.50)

## 2019-03-02 LAB — T4, FREE: Free T4: 1.1 ng/dL (ref 0.8–1.8)

## 2019-03-04 LAB — COMPLETE METABOLIC PANEL WITH GFR
AG Ratio: 1.2 (calc) (ref 1.0–2.5)
ALT: 15 U/L (ref 6–29)
AST: 19 U/L (ref 10–35)
Albumin: 3.8 g/dL (ref 3.6–5.1)
Alkaline phosphatase (APISO): 61 U/L (ref 37–153)
BUN/Creatinine Ratio: 24 (calc) — ABNORMAL HIGH (ref 6–22)
BUN: 33 mg/dL — ABNORMAL HIGH (ref 7–25)
CO2: 21 mmol/L (ref 20–32)
Calcium: 10.1 mg/dL (ref 8.6–10.4)
Chloride: 109 mmol/L (ref 98–110)
Creat: 1.39 mg/dL — ABNORMAL HIGH (ref 0.60–0.93)
GFR, Est African American: 44 mL/min/{1.73_m2} — ABNORMAL LOW (ref >=60)
GFR, Est Non African American: 38 mL/min/{1.73_m2} — ABNORMAL LOW (ref >=60)
Globulin: 3.3 g/dL (calc) (ref 1.9–3.7)
Glucose, Bld: 249 mg/dL — ABNORMAL HIGH (ref 65–99)
Potassium: 4.5 mmol/L (ref 3.5–5.3)
Sodium: 140 mmol/L (ref 135–146)
Total Bilirubin: 0.3 mg/dL (ref 0.2–1.2)
Total Protein: 7.1 g/dL (ref 6.1–8.1)

## 2019-03-04 LAB — MAGNESIUM: Magnesium: 2 mg/dL (ref 1.5–2.5)

## 2019-03-04 LAB — HEMOGLOBIN A1C
Hgb A1c MFr Bld: 10.3 % of total Hgb — ABNORMAL HIGH (ref <5.7)
Mean Plasma Glucose: 249 (calc)
eAG (mmol/L): 13.8 (calc)

## 2019-03-04 LAB — PTH, INTACT AND CALCIUM
Calcium: 10.1 mg/dL (ref 8.6–10.4)
PTH: 85 pg/mL — ABNORMAL HIGH (ref 14–64)

## 2019-03-04 LAB — PHOSPHORUS: Phosphorus: 2.7 mg/dL (ref 2.1–4.3)

## 2019-03-18 ENCOUNTER — Other Ambulatory Visit: Payer: Self-pay

## 2019-03-18 ENCOUNTER — Encounter: Payer: Self-pay | Admitting: "Endocrinology

## 2019-03-18 ENCOUNTER — Ambulatory Visit (INDEPENDENT_AMBULATORY_CARE_PROVIDER_SITE_OTHER): Payer: Medicare Other | Admitting: "Endocrinology

## 2019-03-18 DIAGNOSIS — I10 Essential (primary) hypertension: Secondary | ICD-10-CM | POA: Diagnosis not present

## 2019-03-18 DIAGNOSIS — E890 Postprocedural hypothyroidism: Secondary | ICD-10-CM | POA: Diagnosis not present

## 2019-03-18 DIAGNOSIS — E1159 Type 2 diabetes mellitus with other circulatory complications: Secondary | ICD-10-CM

## 2019-03-18 NOTE — Progress Notes (Signed)
**Note Desiree-Identified via Obfuscation** 03/18/2019, 4:49 PM                                                    Endocrinology Telehealth Visit Follow up Note -During COVID -19 Pandemic  This visit type was conducted due to national recommendations for restrictions regarding the COVID-19 Pandemic  in an effort to limit this patient's exposure and mitigate transmission of the corona virus.  Due to her co-morbid illnesses, [name] is at  moderate to high risk for complications without adequate follow up.  This format is felt to be most appropriate for her at this time.  I connected with this patient on 03/18/2019   by telephone and verified that I am speaking with the correct person using two identifiers. [name], 05-19-1950. she has verbally consented to this visit. All issues noted in this document were discussed and addressed. The format was not optimal for physical exam.    Subjective:    Patient ID: [name], female    DOB: 10-30-1950.  [name] is being engaged in telehealth via telephone in follow-up for management of currently uncontrolled,complicated symptomatic type 2 diabetes and associated hypertension, hypothyroidism. PMD :  [name], MD.   Past Medical History:  Diagnosis Date  . Anginal pain ([place]) 03/19/2012   saw Dr. Cathie Olden .. she thinks its more related to stomach issues  . Arthritis    "all over; mostly in my back" (03/20/2012)  . Breast cancer ([place])    "right" (03/20/2012)  . Cardiomyopathy   . Chest pain 06/2005   Hospitalized, dystolic dysfunction  . Chronic lower back pain    "have 2 herniated discs; going to have to have a fusion" (03/20/2012)  . Family history of anesthesia complication    "father has nausea and vomiting"  . Gallstones   . GERD (gastroesophageal reflux disease)   . H/O hiatal hernia   . History of kidney stones   . History of right  mastectomy   . Hypertension    "patient states never had HTN, takes Hyzaar for heart  . Hypothyroidism   . Irritable bowel syndrome   . Migraines    "it's been a long time since I've had one" (03/20/2012)  . Osteoarthritis   . Peripheral neuropathy   . PONV (postoperative nausea and vomiting)   . Sciatica   . Type II diabetes mellitus ([place])    Past Surgical History:  Procedure Laterality Date  . ABDOMINAL HYSTERECTOMY  1993  . adenosin cardiolite  07/2005   (+) wall motion abnormality  . AXILLARY LYMPH NODE DISSECTION  2003   squamous cell cancer  . BACK SURGERY    . CARDIAC CATHETERIZATION  ? 2005 / 2007  . CHOLECYSTECTOMY N/A 01/24/2018   Procedure: LAPAROSCOPIC CHOLECYSTECTOMY;  Surgeon: Erroll Luna, MD;  Location: [facility];  Service: General;  Laterality: N/A;  . CT abdomen and pelvis  02/2010   Same  . ESOPHAGOGASTRODUODENOSCOPY  2005   GERD  . KNEE  ARTHROSCOPY  1990's   "right" (03/20/2012)  . LUMBAR FUSION N/A 08/22/2014   Procedure: Right L2-3 and right L1-2 transforaminal lumbar interbody fusion with pedicle screws, rods, sleeves, cages, local bone graft, Vivigen, cancellous chips;  Surgeon: Jessy Oto, MD;  Location: [facility];  Service: Orthopedics;  Laterality: N/A;  . LUMBAR LAMINECTOMY/DECOMPRESSION MICRODISCECTOMY N/A 02/04/2014   Procedure: RIGHT L2-3 MICRODISCECTOMY ;  Surgeon: Jessy Oto, MD;  Location: [facility];  Service: Orthopedics;  Laterality: N/A;  . MASTECTOMY Right   . MASTECTOMY WITH AXILLARY LYMPH NODE DISSECTION  12/2003   "right" (03/20/2012)  . POSTERIOR CERVICAL FUSION/FORAMINOTOMY  2001  . SHOULDER ARTHROSCOPY W/ ROTATOR CUFF REPAIR  2003   Left  . THYROIDECTOMY  1977   Right  . TOTAL KNEE ARTHROPLASTY  2000   Right  . [place] of abdomen  07/2005   Fatty liver / kidney stones   Social History   Socioeconomic History  . Marital status: Married    Spouse name: Not on file  . Number of children: 1  . Years of education: Not on file  .  Highest education level: Not on file  Occupational History  . Occupation: Retired since 2003  Social Needs  . Financial resource strain: Not on file  . Food insecurity    Worry: Not on file    Inability: Not on file  . Transportation needs    Medical: Not on file    Non-medical: Not on file  Tobacco Use  . Smoking status: Never Smoker  . Smokeless tobacco: Never Used  Substance and Sexual Activity  . Alcohol use: No  . Drug use: No  . Sexual activity: Not on file  Lifestyle  . Physical activity    Days per week: Not on file    Minutes per session: Not on file  . Stress: Not on file  Relationships  . Social Herbalist on phone: Not on file    Gets together: Not on file    Attends religious service: Not on file    Active member of club or organization: Not on file    Attends meetings of clubs or organizations: Not on file    Relationship status: Not on file  Other Topics Concern  . Not on file  Social History Narrative  . Not on file   Outpatient Encounter Medications as of 03/18/2019  Medication Sig  . acetaminophen (TYLENOL) 500 MG tablet Take 1,000 mg by mouth every 6 (six) hours as needed for moderate pain or headache.  [name] Kitchen amLODipine (NORVASC) 10 MG tablet Take 1 tablet (10 mg total) by mouth daily.  [name] Kitchen aspirin 81 MG tablet Take 81 mg by mouth daily.  . Blood Glucose Calibration (TRUE METRIX LEVEL 2) Normal SOLN Use as directed  . Blood Glucose Monitoring Suppl (TRUE METRIX AIR GLUCOSE METER) w/Device KIT 1 kit by Does not apply route 3 (three) times daily.  . clindamycin (CLEOCIN) 150 MG capsule Take 300 mg by mouth 4 (four) times daily.  . fluticasone (FLONASE) 50 MCG/ACT nasal spray Place 2 sprays into both nostrils daily. (Patient taking differently: Place 2 sprays into both nostrils daily as needed for allergies. )  . furosemide (LASIX) 40 MG tablet TAKE 1 TABLET (40 MG TOTAL) BY MOUTH DAILY AS NEEDED (LEG SWELLING).  [name] Kitchen glucose blood (TRUE METRIX BLOOD  GLUCOSE TEST) test strip Use as instructed  . insulin NPH-regular Human (70-30) 100 UNIT/ML injection Inject 60 Units into the skin 2 (  two) times daily before a meal.  . levothyroxine (SYNTHROID) 100 MCG tablet Take 1 tablet (100 mcg total) by mouth daily.  [name] Kitchen losartan (COZAAR) 50 MG tablet Take 1 tablet (50 mg total) by mouth daily.  . metFORMIN (GLUCOPHAGE) 850 MG tablet Take 1 tablet (850 mg total) by mouth 2 (two) times daily with a meal.  . metoCLOPramide (REGLAN) 10 MG tablet Take 1 tablet (10 mg total) by mouth 4 (four) times daily -  before meals and at bedtime.  . pantoprazole (PROTONIX) 40 MG tablet TAKE 1 TABLET BY MOUTH EVERY DAY  . polyethylene glycol (MIRALAX / GLYCOLAX) packet Take 17 g by mouth daily as needed for moderate constipation.  . pregabalin (LYRICA) 75 MG capsule TAKE 1 CAPSULE BY MOUTH TWICE A DAY  . tiZANidine (ZANAFLEX) 4 MG tablet Take 1 tablet (4 mg total) by mouth every 6 (six) hours as needed for muscle spasms.  . TRUEPLUS LANCETS 33G MISC Use as directed   No facility-administered encounter medications on file as of 03/18/2019.     ALLERGIES: Allergies  Allergen Reactions  . Dilaudid [Hydromorphone Hcl] Nausea And Vomiting  . Lipitor [Atorvastatin] Other (See Comments)    Body & Muscle Aches  . Adhesive [Tape] Rash  . Ampicillin Rash and Other (See Comments)    REACTION: Rash and welts on her hands when she took it in the hospital  . Latex Itching, Dermatitis, Rash and Other (See Comments)    REACTION: Patient had a reaction to tape after surgery and they told her she was allergic to Latex 03/20/2012 "if it's on too long it will break me out"  . Penicillins Rash and Other (See Comments)    Has patient had a PCN reaction causing immediate rash, facial/tongue/throat swelling, SOB or lightheadedness with hypotension: No Has patient had a PCN reaction causing severe rash involving mucus membranes or skin necrosis: No Has patient had a PCN reaction that  required hospitalization: Yes - in hospital receiving treatment Has patient had a PCN reaction occurring within the last 10 years: No If all of the above answers are "NO", then may proceed with Cephalosporin use.     VACCINATION STATUS: Immunization History  Administered Date(s) Administered  . Influenza Split 01/31/2011  . Influenza Whole 02/23/2007  . Influenza, High Dose Seasonal PF 01/10/2017  . Influenza,inj,Quad PF,6+ Mos 01/07/2014, 02/09/2016, 01/01/2018, 12/03/2018  . Influenza-Unspecified 01/10/2012  . Pneumococcal Polysaccharide-23 08/25/2014  . Tdap 04/20/2011    Diabetes She presents for her follow-up diabetic visit. She has type 2 diabetes mellitus. Onset time: She was diagnosed at approximate age of 55 years. Her disease course has been worsening. There are no hypoglycemic associated symptoms. Pertinent negatives for hypoglycemia include no confusion, headaches, pallor or seizures. Associated symptoms include foot ulcerations, polydipsia and polyuria. Pertinent negatives for diabetes include no blurred vision, no chest pain, no fatigue and no polyphagia. There are no hypoglycemic complications. Symptoms are worsening. Diabetic complications include retinopathy. Risk factors for coronary artery disease include diabetes mellitus, hypertension, obesity, sedentary lifestyle and post-menopausal. Current diabetic treatment includes insulin injections (She is currently on Novolin 70/30 65 units a.m., 35 units p.m. along with metformin 850 mg p.o. twice daily.). She is following a generally unhealthy diet. When asked about meal planning, she reported none. She has not had a previous visit with a dietitian. She never participates in exercise. (She did not have her meter during the telehealth.  She estimates glycemic profile between 130-160 at fasting, between 180-200 bedtime.  She denies hypoglycemia.  ) An ACE inhibitor/angiotensin II receptor blocker is being taken. Eye exam is  current.  Hypertension This is a chronic problem. The current episode started more than 1 year ago. The problem is uncontrolled. Pertinent negatives include no blurred vision, chest pain, headaches, palpitations or shortness of breath. Risk factors for coronary artery disease include diabetes mellitus, obesity, sedentary lifestyle, dyslipidemia and family history. Past treatments include angiotensin blockers. Hypertensive end-organ damage includes retinopathy. Identifiable causes of hypertension include a thyroid problem.  Thyroid Problem Presents for follow-up visit. Onset time: She underwent what appears to be total thyroidectomy in her 57s for reportedly benign goiter.  Has taken thyroid hormone ever since, currently levothyroxine 100 mcg p.o. nightly. Patient reports no cold intolerance, diarrhea, fatigue, heat intolerance or palpitations. The symptoms have been stable. Past treatments include levothyroxine. Prior procedures include thyroidectomy.    Review of systems: Limited as above.  Objective:    There were no vitals taken for this visit.  Wt Readings from Last 3 Encounters:  12/27/18 218 lb 8 oz (99.1 kg)  12/12/18 215 lb (97.5 kg)  12/03/18 209 lb 2 oz (94.9 kg)       Recent Results (from the past 2160 hour(s))  Hemoglobin A1c     Status: Abnormal   Collection Time: 03/01/19  2:07 PM  Result Value Ref Range   Hgb A1c MFr Bld 10.3 (H) <5.7 % of total Hgb    Comment: For someone without known diabetes, a hemoglobin A1c value of 6.5% or greater indicates that they may have  diabetes and this should be confirmed with a follow-up  test. . For someone with known diabetes, a value <7% indicates  that their diabetes is well controlled and a value  greater than or equal to 7% indicates suboptimal  control. A1c targets should be individualized based on  duration of diabetes, age, comorbid conditions, and  other considerations. . Currently, no consensus exists regarding use  of hemoglobin A1c for diagnosis of diabetes for children. .    Mean Plasma Glucose 249 (calc)   eAG (mmol/L) 13.8 (calc)  COMPLETE METABOLIC PANEL WITH GFR     Status: Abnormal   Collection Time: 03/01/19  2:07 PM  Result Value Ref Range   Glucose, Bld 249 (H) 65 - 99 mg/dL    Comment: .            Fasting reference interval . For someone without known diabetes, a glucose value >125 mg/dL indicates that they may have diabetes and this should be confirmed with a follow-up test. .    BUN 33 (H) 7 - 25 mg/dL   Creat 1.39 (H) 0.60 - 0.93 mg/dL    Comment: For patients >27 years of age, the reference limit for Creatinine is approximately 13% higher for people identified as African-American. .    GFR, Est Non African American 38 (L) > OR = 60 mL/min/1.64m   GFR, Est African American 44 (L) > OR = 60 mL/min/1.73m  BUN/Creatinine Ratio 24 (H) 6 - 22 (calc)   Sodium 140 135 - 146 mmol/L   Potassium 4.5 3.5 - 5.3 mmol/L   Chloride 109 98 - 110 mmol/L   CO2 21 20 - 32 mmol/L   Calcium 10.1 8.6 - 10.4 mg/dL   Total Protein 7.1 6.1 - 8.1 g/dL   Albumin 3.8 3.6 - 5.1 g/dL   Globulin 3.3 1.9 - 3.7 g/dL (calc)   AG Ratio 1.2 1.0 - 2.5 (calc)  Total Bilirubin 0.3 0.2 - 1.2 mg/dL   Alkaline phosphatase (APISO) 61 37 - 153 U/L   AST 19 10 - 35 U/L   ALT 15 6 - 29 U/L  PTH, intact and calcium     Status: Abnormal   Collection Time: 03/01/19  2:07 PM  Result Value Ref Range   PTH 85 (H) 14 - 64 pg/mL    Comment: . Interpretive Guide    Intact PTH           Calcium ------------------    ----------           ------- Normal Parathyroid    Normal               Normal Hypoparathyroidism    Low or Low Normal    Low Hyperparathyroidism    Primary            Normal or High       High    Secondary          High                 Normal or Low    Tertiary           High                 High Non-Parathyroid    Hypercalcemia      Low or Low Normal    High .    Calcium 10.1 8.6 - 10.4  mg/dL  Magnesium     Status: None   Collection Time: 03/01/19  2:07 PM  Result Value Ref Range   Magnesium 2.0 1.5 - 2.5 mg/dL  Phosphorus     Status: None   Collection Time: 03/01/19  2:07 PM  Result Value Ref Range   Phosphorus 2.7 2.1 - 4.3 mg/dL  TSH     Status: None   Collection Time: 03/01/19  2:29 PM  Result Value Ref Range   TSH 2.13 0.40 - 4.50 mIU/L  T4, free     Status: None   Collection Time: 03/01/19  2:29 PM  Result Value Ref Range   Free T4 1.1 0.8 - 1.8 ng/dL     Diabetic Labs (most recent): Lab Results  Component Value Date   HGBA1C 10.3 (H) 03/01/2019   HGBA1C 9.1 (H) 10/02/2018   HGBA1C 9.8 (H) 06/01/2018     Lipid Panel ( most recent) Lipid Panel     Component Value Date/Time   CHOL 184 06/01/2018 1525   TRIG 338 (H) 06/01/2018 1525   HDL 43 (L) 06/01/2018 1525   CHOLHDL 4.3 06/01/2018 1525   VLDL 49 (H) 06/30/2016 0944   LDLCALC 96 06/01/2018 1525      Assessment & Plan:   1. DM type 2 causing vascular disease ([place])  - [name] has currently uncontrolled symptomatic type 2 DM since 68 years of age. -Her previsit labs show A1c of 10.3%, increasing from 9.1%.      Recent labs, biopsy results reviewed.   -her diabetes is complicated by retinopathy, obesity/sedentary life, and she remains at a high risk for more acute and chronic complications which include CAD, CVA, CKD, retinopathy, and neuropathy. These are all discussed in detail with her.  - I have counseled her on diet management and weight loss, by adopting a carbohydrate restricted/protein rich diet. - she  admits there is a room for improvement in her diet and drink choices. -  Suggestion is made for  her to avoid simple carbohydrates  from her diet including Cakes, Sweet Desserts / Pastries, Ice Cream, Soda (diet and regular), Sweet Tea, Candies, Chips, Cookies, Sweet Pastries,  Store Bought Juices, Alcohol in Excess of  1-2 drinks a day, Artificial Sweeteners, Coffee  Creamer, and "Sugar-free" Products. This will help patient to have stable blood glucose profile and potentially avoid unintended weight gain.   - I encouraged her to switch to  unprocessed or minimally processed complex starch and increased protein intake (animal or plant source), fruits, and vegetables.  - she is advised to stick to a routine mealtimes to eat 3 meals  a day and avoid unnecessary snacks ( to snack only to correct hypoglycemia).   - she is following with Jearld Fenton, RDN, CDE for individualized diabetes education.  - I have approached her with the following individualized plan to manage diabetes and patient agrees:   -She will continue to benefit from her current simplified insulin treatment regimen.    -She is advised to increase Novolin 70/30 to 60 units daily with breakfast and 60 units daily with supper for pre-meal blood glucose readings above 90 mg/dL,  associated with strict monitoring of glucose 2 times a day-before breakfast and before supper and at any other time as needed.  - she is warned not to take insulin without proper monitoring per orders. - she is encouraged to call clinic for blood glucose levels less than 70 or above 300 mg /dl. -She is advised to continue Metformin 850 mg XR p.o. daily after breakfast.    -If her glycemic profile is not controlled optimally , she will be considered for basal/bolus insulin. - Patient specific target  A1c;  LDL, HDL, Triglycerides, and  Waist Circumference were discussed in detail.  2) BP/HTN: she is advised to home monitor blood pressure and report if > 140/90 on 2 separate readings.     she is advised to continue her current medications including losartan 50 mg p.o. daily with breakfast .  3) Lipids/HPL:   Review of her recent lipid panel showed uncontrolled  LDL at 98.  she has documented adverse reactions to Lipitor.  She will be considered for low-dose Niaspan on next visit.    4)  Weight/Diet: Her BMI 36-  clearly complicating her diabetes care.  I discussed with her the fact that loss of 5 - 10% of her  current body weight will have the most impact on her diabetes management.  CDE Consult will be initiated . Exercise, and detailed carbohydrates information provided  -  detailed on discharge instructions.  5) hypothyroidism-postsurgical  Her previsit thyroid function tests are consistent with appropriate replacement.  She is advised to continue levothyroxine 100 mcg p.o. every morning for now.  - We discussed about the correct intake of her thyroid hormone, on empty stomach at fasting, with water, separated by at least 30 minutes from breakfast and other medications,  and separated by more than 4 hours from calcium, iron, multivitamins, acid reflux medications (PPIs). -Patient is made aware of the fact that thyroid hormone replacement is needed for life, dose to be adjusted by periodic monitoring of thyroid function tests.   6) nodular goiter. -Her most recent thyroid/neck ultrasound revealed 2.9 cm nodule on the left lobe of her thyroid -She has surgically absent right thyroid lobe.  Biopsy of 2.9 cm nodule on the left lobe is benign.    She would not need any surgical intervention at this time.     7)  vitamin D deficiency -She is advised to continue vitamin D3 5000 units daily for the next 90 days.  8) Chronic Care/Health Maintenance:  -she  is on ARB and Statin medications and  is encouraged to initiate and continue to follow up with Ophthalmology, Dentist,  Podiatrist at least yearly or according to recommendations, and advised to  stay away from smoking. I have recommended yearly flu vaccine and pneumonia vaccine at least every 5 years; moderate intensity exercise for up to 150 minutes weekly; and  sleep for at least 7 hours a day.  - I advised patient to maintain close follow up with [name], MD for primary care needs.  - Patient Care Time Today:  25 min, of which >50% was  spent in  counseling and the rest reviewing her  current and  previous labs/studies, previous treatments, her blood glucose readings, and medications' doses and developing a plan for long-term care based on the latest recommendations for standards of care.   [name] participated in the discussions, expressed understanding, and voiced agreement with the above plans.  All questions were answered to her satisfaction. she is encouraged to contact clinic should she have any questions or concerns prior to her return visit.    Follow up plan: - Return in about 2 weeks (around 04/01/2019) for Follow up with Meter and Logs Only - no Labs.  Glade Lloyd, MD [facility] [address] [place], [place] [address] Phone: [phone]  Fax: [phone]    03/18/2019, 4:49 PM  This note was partially dictated with voice recognition software. Similar sounding words can be transcribed inadequately or may not  be corrected upon review.

## 2019-03-29 ENCOUNTER — Other Ambulatory Visit: Payer: Self-pay | Admitting: Family Medicine

## 2019-04-03 ENCOUNTER — Ambulatory Visit (INDEPENDENT_AMBULATORY_CARE_PROVIDER_SITE_OTHER): Payer: Medicare Other | Admitting: "Endocrinology

## 2019-04-03 ENCOUNTER — Encounter: Payer: Self-pay | Admitting: "Endocrinology

## 2019-04-03 DIAGNOSIS — I10 Essential (primary) hypertension: Secondary | ICD-10-CM | POA: Diagnosis not present

## 2019-04-03 DIAGNOSIS — E890 Postprocedural hypothyroidism: Secondary | ICD-10-CM | POA: Diagnosis not present

## 2019-04-03 DIAGNOSIS — E1159 Type 2 diabetes mellitus with other circulatory complications: Secondary | ICD-10-CM

## 2019-04-03 DIAGNOSIS — E559 Vitamin D deficiency, unspecified: Secondary | ICD-10-CM

## 2019-04-03 NOTE — Progress Notes (Signed)
**Note Desiree-Identified via Obfuscation** 04/03/2019, 3:25 PM                                                    Endocrinology Telehealth Visit Follow up Note -During COVID -19 Pandemic  This visit type was conducted due to national recommendations for restrictions regarding the COVID-19 Pandemic  in an effort to limit this patient's exposure and mitigate transmission of the corona virus.  Due to her co-morbid illnesses, [name] is at  moderate to high risk for complications without adequate follow up.  This format is felt to be most appropriate for her at this time.  I connected with this patient on 04/03/2019   by telephone and verified that I am speaking with the correct person using two identifiers. [name], [birth year]. she has verbally consented to this visit. All issues noted in this document were discussed and addressed. The format was not optimal for physical exam.    Subjective:    Patient ID: [name], female    DOB: Aug 30, 1950.  [name] is being engaged in telehealth via telephone in follow-up for management of currently uncontrolled,complicated symptomatic type 2 diabetes and associated hypertension, hypothyroidism. PMD :  [name], MD.   Past Medical History:  Diagnosis Date  . Anginal pain ([place]) 03/19/2012   saw Dr. Cathie Olden .. she thinks its more related to stomach issues  . Arthritis    "all over; mostly in my back" (03/20/2012)  . Breast cancer ([place])    "right" (03/20/2012)  . Cardiomyopathy   . Chest pain 06/2005   Hospitalized, dystolic dysfunction  . Chronic lower back pain    "have 2 herniated discs; going to have to have a fusion" (03/20/2012)  . Family history of anesthesia complication    "father has nausea and vomiting"  . Gallstones   . GERD (gastroesophageal reflux disease)   . H/O hiatal hernia   . History of kidney stones   . History of right  mastectomy   . Hypertension    "patient states never had HTN, takes Hyzaar for heart  . Hypothyroidism   . Irritable bowel syndrome   . Migraines    "it's been a long time since I've had one" (03/20/2012)  . Osteoarthritis   . Peripheral neuropathy   . PONV (postoperative nausea and vomiting)   . Sciatica   . Type II diabetes mellitus ([place])    Past Surgical History:  Procedure Laterality Date  . ABDOMINAL HYSTERECTOMY  1993  . adenosin cardiolite  07/2005   (+) wall motion abnormality  . AXILLARY LYMPH NODE DISSECTION  2003   squamous cell cancer  . BACK SURGERY    . CARDIAC CATHETERIZATION  ? 2005 / 2007  . CHOLECYSTECTOMY N/A 01/24/2018   Procedure: LAPAROSCOPIC CHOLECYSTECTOMY;  Surgeon: Erroll Luna, MD;  Location: [facility];  Service: General;  Laterality: N/A;  . CT abdomen and pelvis  02/2010   Same  . ESOPHAGOGASTRODUODENOSCOPY  2005   GERD  . KNEE  ARTHROSCOPY  1990's   "right" (03/20/2012)  . LUMBAR FUSION N/A 08/22/2014   Procedure: Right L2-3 and right L1-2 transforaminal lumbar interbody fusion with pedicle screws, rods, sleeves, cages, local bone graft, Vivigen, cancellous chips;  Surgeon: Jessy Oto, MD;  Location: [facility];  Service: Orthopedics;  Laterality: N/A;  . LUMBAR LAMINECTOMY/DECOMPRESSION MICRODISCECTOMY N/A 02/04/2014   Procedure: RIGHT L2-3 MICRODISCECTOMY ;  Surgeon: Jessy Oto, MD;  Location: [facility];  Service: Orthopedics;  Laterality: N/A;  . MASTECTOMY Right   . MASTECTOMY WITH AXILLARY LYMPH NODE DISSECTION  12/2003   "right" (03/20/2012)  . POSTERIOR CERVICAL FUSION/FORAMINOTOMY  2001  . SHOULDER ARTHROSCOPY W/ ROTATOR CUFF REPAIR  2003   Left  . THYROIDECTOMY  1977   Right  . TOTAL KNEE ARTHROPLASTY  2000   Right  . [place] of abdomen  07/2005   Fatty liver / kidney stones   Social History   Socioeconomic History  . Marital status: Married    Spouse name: Not on file  . Number of children: 1  . Years of education: Not on file  .  Highest education level: Not on file  Occupational History  . Occupation: Retired since 2003  Tobacco Use  . Smoking status: Never Smoker  . Smokeless tobacco: Never Used  Substance and Sexual Activity  . Alcohol use: No  . Drug use: No  . Sexual activity: Not on file  Other Topics Concern  . Not on file  Social History Narrative  . Not on file   Social Determinants of Health   Financial Resource Strain:   . Difficulty of Paying Living Expenses: Not on file  Food Insecurity:   . Worried About Charity fundraiser in the Last Year: Not on file  . Ran Out of Food in the Last Year: Not on file  Transportation Needs:   . Lack of Transportation (Medical): Not on file  . Lack of Transportation (Non-Medical): Not on file  Physical Activity:   . Days of Exercise per Week: Not on file  . Minutes of Exercise per Session: Not on file  Stress:   . Feeling of Stress : Not on file  Social Connections:   . Frequency of Communication with Friends and Family: Not on file  . Frequency of Social Gatherings with Friends and Family: Not on file  . Attends Religious Services: Not on file  . Active Member of Clubs or Organizations: Not on file  . Attends Archivist Meetings: Not on file  . Marital Status: Not on file   Outpatient Encounter Medications as of 04/03/2019  Medication Sig  . acetaminophen (TYLENOL) 500 MG tablet Take 1,000 mg by mouth every 6 (six) hours as needed for moderate pain or headache.  [name] Kitchen amLODipine (NORVASC) 10 MG tablet TAKE 1 TABLET BY MOUTH EVERY DAY  . aspirin 81 MG tablet Take 81 mg by mouth daily.  . Blood Glucose Calibration (TRUE METRIX LEVEL 2) Normal SOLN Use as directed  . Blood Glucose Monitoring Suppl (TRUE METRIX AIR GLUCOSE METER) w/Device KIT 1 kit by Does not apply route 3 (three) times daily.  . clindamycin (CLEOCIN) 150 MG capsule Take 300 mg by mouth 4 (four) times daily.  . fluticasone (FLONASE) 50 MCG/ACT nasal spray Place 2 sprays into both  nostrils daily. (Patient taking differently: Place 2 sprays into both nostrils daily as needed for allergies. )  . furosemide (LASIX) 40 MG tablet TAKE 1 TABLET (40 MG TOTAL) BY MOUTH DAILY  AS NEEDED (LEG SWELLING).  [name] Kitchen glucose blood (TRUE METRIX BLOOD GLUCOSE TEST) test strip Use as instructed  . insulin NPH-regular Human (70-30) 100 UNIT/ML injection Inject 60-70 Units into the skin 2 (two) times daily before a meal.  . levothyroxine (SYNTHROID) 100 MCG tablet Take 1 tablet (100 mcg total) by mouth daily.  [name] Kitchen losartan (COZAAR) 50 MG tablet TAKE 1 TABLET BY MOUTH EVERY DAY  . metFORMIN (GLUCOPHAGE) 850 MG tablet Take 1 tablet (850 mg total) by mouth 2 (two) times daily with a meal.  . metoCLOPramide (REGLAN) 10 MG tablet Take 1 tablet (10 mg total) by mouth 4 (four) times daily -  before meals and at bedtime.  . pantoprazole (PROTONIX) 40 MG tablet TAKE 1 TABLET BY MOUTH EVERY DAY  . polyethylene glycol (MIRALAX / GLYCOLAX) packet Take 17 g by mouth daily as needed for moderate constipation.  . pregabalin (LYRICA) 75 MG capsule TAKE 1 CAPSULE BY MOUTH TWICE A DAY  . tiZANidine (ZANAFLEX) 4 MG tablet Take 1 tablet (4 mg total) by mouth every 6 (six) hours as needed for muscle spasms.  . TRUEPLUS LANCETS 33G MISC Use as directed   No facility-administered encounter medications on file as of 04/03/2019.    ALLERGIES: Allergies  Allergen Reactions  . Dilaudid [Hydromorphone Hcl] Nausea And Vomiting  . Lipitor [Atorvastatin] Other (See Comments)    Body & Muscle Aches  . Adhesive [Tape] Rash  . Ampicillin Rash and Other (See Comments)    REACTION: Rash and welts on her hands when she took it in the hospital  . Latex Itching, Dermatitis, Rash and Other (See Comments)    REACTION: Patient had a reaction to tape after surgery and they told her she was allergic to Latex 03/20/2012 "if it's on too long it will break me out"  . Penicillins Rash and Other (See Comments)    Has patient had a PCN  reaction causing immediate rash, facial/tongue/throat swelling, SOB or lightheadedness with hypotension: No Has patient had a PCN reaction causing severe rash involving mucus membranes or skin necrosis: No Has patient had a PCN reaction that required hospitalization: Yes - in hospital receiving treatment Has patient had a PCN reaction occurring within the last 10 years: No If all of the above answers are "NO", then may proceed with Cephalosporin use.     VACCINATION STATUS: Immunization History  Administered Date(s) Administered  . Influenza Split 01/31/2011  . Influenza Whole 02/23/2007  . Influenza, High Dose Seasonal PF 01/10/2017  . Influenza,inj,Quad PF,6+ Mos 01/07/2014, 02/09/2016, 01/01/2018, 12/03/2018  . Influenza-Unspecified 01/10/2012  . Pneumococcal Polysaccharide-23 08/25/2014  . Tdap 04/20/2011    Diabetes She presents for her follow-up diabetic visit. She has type 2 diabetes mellitus. Onset time: She was diagnosed at approximate age of 71 years. Her disease course has been improving. There are no hypoglycemic associated symptoms. Pertinent negatives for hypoglycemia include no confusion, headaches, pallor or seizures. Associated symptoms include foot ulcerations, polydipsia and polyuria. Pertinent negatives for diabetes include no blurred vision, no chest pain, no fatigue and no polyphagia. There are no hypoglycemic complications. Symptoms are improving. Diabetic complications include retinopathy. Risk factors for coronary artery disease include diabetes mellitus, hypertension, obesity, sedentary lifestyle and post-menopausal. Current diabetic treatment includes insulin injections (She is currently on Novolin 70/30 65 units a.m., 35 units p.m. along with metformin 850 mg p.o. twice daily.). She is following a generally unhealthy diet. When asked about meal planning, she reported none. She has not had a previous  visit with a dietitian. She never participates in exercise. Her  breakfast blood glucose range is generally 130-140 mg/dl. Her overall blood glucose range is >200 mg/dl. (She reports controlled glycemic profile at fasting, continued above target postprandial blood glucose profile. She denies hypoglycemia.  ) An ACE inhibitor/angiotensin II receptor blocker is being taken. Eye exam is current.  Hypertension This is a chronic problem. The current episode started more than 1 year ago. The problem is uncontrolled. Pertinent negatives include no blurred vision, chest pain, headaches, palpitations or shortness of breath. Risk factors for coronary artery disease include diabetes mellitus, obesity, sedentary lifestyle, dyslipidemia and family history. Past treatments include angiotensin blockers. Hypertensive end-organ damage includes retinopathy. Identifiable causes of hypertension include a thyroid problem.  Thyroid Problem Presents for follow-up visit. Onset time: She underwent what appears to be total thyroidectomy in her 57s for reportedly benign goiter.  Has taken thyroid hormone ever since, currently levothyroxine 100 mcg p.o. nightly. Patient reports no cold intolerance, diarrhea, fatigue, heat intolerance or palpitations. The symptoms have been stable. Past treatments include levothyroxine. Prior procedures include thyroidectomy.    Review of systems: Limited as above.  Objective:    There were no vitals taken for this visit.  Wt Readings from Last 3 Encounters:  12/27/18 218 lb 8 oz (99.1 kg)  12/12/18 215 lb (97.5 kg)  12/03/18 209 lb 2 oz (94.9 kg)      Recent Results (from the past 2160 hour(s))  Hemoglobin A1c     Status: Abnormal   Collection Time: 03/01/19  2:07 PM  Result Value Ref Range   Hgb A1c MFr Bld 10.3 (H) <5.7 % of total Hgb    Comment: For someone without known diabetes, a hemoglobin A1c value of 6.5% or greater indicates that they may have  diabetes and this should be confirmed with a follow-up  test. . For someone with known  diabetes, a value <7% indicates  that their diabetes is well controlled and a value  greater than or equal to 7% indicates suboptimal  control. A1c targets should be individualized based on  duration of diabetes, age, comorbid conditions, and  other considerations. . Currently, no consensus exists regarding use of hemoglobin A1c for diagnosis of diabetes for children. .    Mean Plasma Glucose 249 (calc)   eAG (mmol/L) 13.8 (calc)  COMPLETE METABOLIC PANEL WITH GFR     Status: Abnormal   Collection Time: 03/01/19  2:07 PM  Result Value Ref Range   Glucose, Bld 249 (H) 65 - 99 mg/dL    Comment: .            Fasting reference interval . For someone without known diabetes, a glucose value >125 mg/dL indicates that they may have diabetes and this should be confirmed with a follow-up test. .    BUN 33 (H) 7 - 25 mg/dL   Creat 1.39 (H) 0.60 - 0.93 mg/dL    Comment: For patients >60 years of age, the reference limit for Creatinine is approximately 13% higher for people identified as African-American. .    GFR, Est Non African American 38 (L) > OR = 60 mL/min/1.58m   GFR, Est African American 44 (L) > OR = 60 mL/min/1.759m  BUN/Creatinine Ratio 24 (H) 6 - 22 (calc)   Sodium 140 135 - 146 mmol/L   Potassium 4.5 3.5 - 5.3 mmol/L   Chloride 109 98 - 110 mmol/L   CO2 21 20 - 32 mmol/L   Calcium 10.1  8.6 - 10.4 mg/dL   Total Protein 7.1 6.1 - 8.1 g/dL   Albumin 3.8 3.6 - 5.1 g/dL   Globulin 3.3 1.9 - 3.7 g/dL (calc)   AG Ratio 1.2 1.0 - 2.5 (calc)   Total Bilirubin 0.3 0.2 - 1.2 mg/dL   Alkaline phosphatase (APISO) 61 37 - 153 U/L   AST 19 10 - 35 U/L   ALT 15 6 - 29 U/L  PTH, intact and calcium     Status: Abnormal   Collection Time: 03/01/19  2:07 PM  Result Value Ref Range   PTH 85 (H) 14 - 64 pg/mL    Comment: . Interpretive Guide    Intact PTH           Calcium ------------------    ----------           ------- Normal Parathyroid    Normal                Normal Hypoparathyroidism    Low or Low Normal    Low Hyperparathyroidism    Primary            Normal or High       High    Secondary          High                 Normal or Low    Tertiary           High                 High Non-Parathyroid    Hypercalcemia      Low or Low Normal    High .    Calcium 10.1 8.6 - 10.4 mg/dL  Magnesium     Status: None   Collection Time: 03/01/19  2:07 PM  Result Value Ref Range   Magnesium 2.0 1.5 - 2.5 mg/dL  Phosphorus     Status: None   Collection Time: 03/01/19  2:07 PM  Result Value Ref Range   Phosphorus 2.7 2.1 - 4.3 mg/dL  TSH     Status: None   Collection Time: 03/01/19  2:29 PM  Result Value Ref Range   TSH 2.13 0.40 - 4.50 mIU/L  T4, free     Status: None   Collection Time: 03/01/19  2:29 PM  Result Value Ref Range   Free T4 1.1 0.8 - 1.8 ng/dL     Diabetic Labs (most recent): Lab Results  Component Value Date   HGBA1C 10.3 (H) 03/01/2019   HGBA1C 9.1 (H) 10/02/2018   HGBA1C 9.8 (H) 06/01/2018     Lipid Panel ( most recent) Lipid Panel     Component Value Date/Time   CHOL 184 06/01/2018 1525   TRIG 338 (H) 06/01/2018 1525   HDL 43 (L) 06/01/2018 1525   CHOLHDL 4.3 06/01/2018 1525   VLDL 49 (H) 06/30/2016 0944   LDLCALC 96 06/01/2018 1525      Assessment & Plan:   1. DM type 2 causing vascular disease ([place])  - [name] has currently uncontrolled symptomatic type 2 DM since 68 years of age.  She reports near target glycemic profile fasting: 157, 156, 79, 105, 188, 198, 156, 207, 164 [place] slightly above target: 270, 101, 200, 215, 206, 201, 195, 200, 203 -Her recent previsit labs show A1c of 10.3% increasing from 9.1%.      Recent labs, biopsy results reviewed.   -her diabetes is complicated by retinopathy,  obesity/sedentary life, and she remains at a high risk for more acute and chronic complications which include CAD, CVA, CKD, retinopathy, and neuropathy. These are all discussed in  detail with her.  - I have counseled her on diet management and weight loss, by adopting a carbohydrate restricted/protein rich diet.  - she  admits there is a room for improvement in her diet and drink choices. -  Suggestion is made for her to avoid simple carbohydrates  from her diet including Cakes, Sweet Desserts / Pastries, Ice Cream, Soda (diet and regular), Sweet Tea, Candies, Chips, Cookies, Sweet Pastries,  Store Bought Juices, Alcohol in Excess of  1-2 drinks a day, Artificial Sweeteners, Coffee Creamer, and "Sugar-free" Products. This will help patient to have stable blood glucose profile and potentially avoid unintended weight gain.  - I encouraged her to switch to  unprocessed or minimally processed complex starch and increased protein intake (animal or plant source), fruits, and vegetables.  - she is advised to stick to a routine mealtimes to eat 3 meals  a day and avoid unnecessary snacks ( to snack only to correct hypoglycemia).   - she is following with Jearld Fenton, RDN, CDE for individualized diabetes education.  - I have approached her with the following individualized plan to manage diabetes and patient agrees:   -She will continue to benefit from her current simplified insulin treatment regimen.    -She is advised to increase Novolin 70/30 to 70 units daily with breakfast, continue at 60 units daily with supper  for pre-meal blood glucose readings above 90 mg/dL,  associated with strict monitoring of glucose 2 times a day-before breakfast and before supper and at any other time as needed.  - she is warned not to take insulin without proper monitoring per orders. - she is encouraged to call clinic for blood glucose levels less than 70 or above 300 mg /dl. -She is advised to continue Metformin 850 mg XR p.o. daily after breakfast.   -If her A1c remains above 8% during her next visit, she will be considered for low-dose glipizide therapy.  - Patient specific target  A1c;   LDL, HDL, Triglycerides, and  Waist Circumference were discussed in detail.  2) BP/HTN: she is advised to home monitor blood pressure and report if > 140/90 on 2 separate readings.      she is advised to continue her current medications including losartan 50 mg p.o. daily with breakfast .  3) Lipids/HPL:   Review of her recent lipid panel showed uncontrolled  LDL at 96.  she has documented adverse reactions to Lipitor.  She will be considered for low-dose Niaspan on next visit.    4)  Weight/Diet: Her BMI 36- clearly complicating her diabetes care.  I discussed with her the fact that loss of 5 - 10% of her  current body weight will have the most impact on her diabetes management.  CDE Consult will be initiated . Exercise, and detailed carbohydrates information provided  -  detailed on discharge instructions.  5) hypothyroidism-postsurgical  Her recent thyroid function tests are consistent with appropriate replacement.  She is advised to continue levothyroxine 100 mcg p.o. daily before breakfast.   - We discussed about the correct intake of her thyroid hormone, on empty stomach at fasting, with water, separated by at least 30 minutes from breakfast and other medications,  and separated by more than 4 hours from calcium, iron, multivitamins, acid reflux medications (PPIs). -Patient is made aware of the fact  that thyroid hormone replacement is needed for life, dose to be adjusted by periodic monitoring of thyroid function tests.  6) nodular goiter. -Her most recent thyroid/neck ultrasound revealed 2.9 cm nodule on the left lobe of her thyroid -She has surgically absent right thyroid lobe.  Biopsy of 2.9 cm nodule on the left lobe is benign.    She will not need any surgical intervention at this time.     7) vitamin D deficiency -She is advised to continue vitamin D3 5000 units daily for the next 90 days.  8) Chronic Care/Health Maintenance:  -she  is on ARB and Statin medications and  is  encouraged to initiate and continue to follow up with Ophthalmology, Dentist,  Podiatrist at least yearly or according to recommendations, and advised to  stay away from smoking. I have recommended yearly flu vaccine and pneumonia vaccine at least every 5 years; moderate intensity exercise for up to 150 minutes weekly; and  sleep for at least 7 hours a day.  - I advised patient to maintain close follow up with [name], MD for primary care needs.  - Patient Care Time Today:  25 min, of which >50% was spent in  counseling and the rest reviewing her  current and  previous labs/studies, previous treatments, her blood glucose readings, and medications' doses and developing a plan for long-term care based on the latest recommendations for standards of care.   [name] participated in the discussions, expressed understanding, and voiced agreement with the above plans.  All questions were answered to her satisfaction. she is encouraged to contact clinic should she have any questions or concerns prior to her return visit.   Follow up plan: - Return in about 3 months (around 07/02/2019) for Bring Meter and Logs- A1c in Office, Include 8 log sheets.  Glade Lloyd, MD [facility] [address] [place],  [address] Phone: [phone]  Fax: [phone]    04/03/2019, 3:25 PM  This note was partially dictated with voice recognition software. Similar sounding words can be transcribed inadequately or may not  be corrected upon review.

## 2019-04-15 ENCOUNTER — Other Ambulatory Visit: Payer: Self-pay

## 2019-04-15 ENCOUNTER — Encounter (HOSPITAL_BASED_OUTPATIENT_CLINIC_OR_DEPARTMENT_OTHER): Payer: Self-pay

## 2019-04-15 ENCOUNTER — Emergency Department (HOSPITAL_BASED_OUTPATIENT_CLINIC_OR_DEPARTMENT_OTHER): Payer: Medicare HMO

## 2019-04-15 ENCOUNTER — Inpatient Hospital Stay (HOSPITAL_BASED_OUTPATIENT_CLINIC_OR_DEPARTMENT_OTHER)
Admission: EM | Admit: 2019-04-15 | Discharge: 2019-04-20 | DRG: 313 | Disposition: A | Discharge status: Home / Self Care | Payer: Medicare HMO | Attending: Internal Medicine | Admitting: Internal Medicine

## 2019-04-15 ENCOUNTER — Telehealth: Payer: Self-pay | Admitting: General Practice

## 2019-04-15 DIAGNOSIS — I7121 Aneurysm of the ascending aorta, without rupture: Secondary | ICD-10-CM

## 2019-04-15 DIAGNOSIS — K589 Irritable bowel syndrome without diarrhea: Secondary | ICD-10-CM | POA: Diagnosis present

## 2019-04-15 DIAGNOSIS — G4733 Obstructive sleep apnea (adult) (pediatric): Secondary | ICD-10-CM | POA: Diagnosis present

## 2019-04-15 DIAGNOSIS — E669 Obesity, unspecified: Secondary | ICD-10-CM | POA: Diagnosis present

## 2019-04-15 DIAGNOSIS — R0789 Other chest pain: Secondary | ICD-10-CM

## 2019-04-15 DIAGNOSIS — Z803 Family history of malignant neoplasm of breast: Secondary | ICD-10-CM

## 2019-04-15 DIAGNOSIS — D631 Anemia in chronic kidney disease: Secondary | ICD-10-CM | POA: Diagnosis present

## 2019-04-15 DIAGNOSIS — I10 Essential (primary) hypertension: Secondary | ICD-10-CM | POA: Diagnosis present

## 2019-04-15 DIAGNOSIS — R0602 Shortness of breath: Secondary | ICD-10-CM | POA: Diagnosis not present

## 2019-04-15 DIAGNOSIS — Z9011 Acquired absence of right breast and nipple: Secondary | ICD-10-CM

## 2019-04-15 DIAGNOSIS — N183 Chronic kidney disease, stage 3 unspecified: Secondary | ICD-10-CM | POA: Diagnosis not present

## 2019-04-15 DIAGNOSIS — I712 Thoracic aortic aneurysm, without rupture: Secondary | ICD-10-CM | POA: Diagnosis not present

## 2019-04-15 DIAGNOSIS — Z9049 Acquired absence of other specified parts of digestive tract: Secondary | ICD-10-CM

## 2019-04-15 DIAGNOSIS — K760 Fatty (change of) liver, not elsewhere classified: Secondary | ICD-10-CM | POA: Diagnosis present

## 2019-04-15 DIAGNOSIS — Z91048 Other nonmedicinal substance allergy status: Secondary | ICD-10-CM

## 2019-04-15 DIAGNOSIS — Z96651 Presence of right artificial knee joint: Secondary | ICD-10-CM | POA: Diagnosis present

## 2019-04-15 DIAGNOSIS — N141 Nephropathy induced by other drugs, medicaments and biological substances: Secondary | ICD-10-CM | POA: Diagnosis not present

## 2019-04-15 DIAGNOSIS — R0902 Hypoxemia: Secondary | ICD-10-CM | POA: Diagnosis not present

## 2019-04-15 DIAGNOSIS — I428 Other cardiomyopathies: Secondary | ICD-10-CM | POA: Diagnosis present

## 2019-04-15 DIAGNOSIS — I130 Hypertensive heart and chronic kidney disease with heart failure and stage 1 through stage 4 chronic kidney disease, or unspecified chronic kidney disease: Secondary | ICD-10-CM | POA: Diagnosis not present

## 2019-04-15 DIAGNOSIS — Z833 Family history of diabetes mellitus: Secondary | ICD-10-CM

## 2019-04-15 DIAGNOSIS — N179 Acute kidney failure, unspecified: Secondary | ICD-10-CM

## 2019-04-15 DIAGNOSIS — R059 Cough, unspecified: Secondary | ICD-10-CM

## 2019-04-15 DIAGNOSIS — R05 Cough: Secondary | ICD-10-CM

## 2019-04-15 DIAGNOSIS — K449 Diaphragmatic hernia without obstruction or gangrene: Secondary | ICD-10-CM | POA: Diagnosis present

## 2019-04-15 DIAGNOSIS — R0600 Dyspnea, unspecified: Secondary | ICD-10-CM | POA: Diagnosis not present

## 2019-04-15 DIAGNOSIS — M542 Cervicalgia: Secondary | ICD-10-CM

## 2019-04-15 DIAGNOSIS — T508X5A Adverse effect of diagnostic agents, initial encounter: Secondary | ICD-10-CM | POA: Diagnosis not present

## 2019-04-15 DIAGNOSIS — R079 Chest pain, unspecified: Secondary | ICD-10-CM

## 2019-04-15 DIAGNOSIS — Z20822 Contact with and (suspected) exposure to covid-19: Secondary | ICD-10-CM | POA: Diagnosis present

## 2019-04-15 DIAGNOSIS — Z880 Allergy status to penicillin: Secondary | ICD-10-CM

## 2019-04-15 DIAGNOSIS — I340 Nonrheumatic mitral (valve) insufficiency: Secondary | ICD-10-CM | POA: Diagnosis not present

## 2019-04-15 DIAGNOSIS — Z888 Allergy status to other drugs, medicaments and biological substances status: Secondary | ICD-10-CM

## 2019-04-15 DIAGNOSIS — Z8249 Family history of ischemic heart disease and other diseases of the circulatory system: Secondary | ICD-10-CM

## 2019-04-15 DIAGNOSIS — E785 Hyperlipidemia, unspecified: Secondary | ICD-10-CM | POA: Diagnosis present

## 2019-04-15 DIAGNOSIS — I2721 Secondary pulmonary arterial hypertension: Secondary | ICD-10-CM | POA: Diagnosis present

## 2019-04-15 DIAGNOSIS — R062 Wheezing: Secondary | ICD-10-CM

## 2019-04-15 DIAGNOSIS — Z6836 Body mass index (BMI) 36.0-36.9, adult: Secondary | ICD-10-CM

## 2019-04-15 DIAGNOSIS — Z79899 Other long term (current) drug therapy: Secondary | ICD-10-CM

## 2019-04-15 DIAGNOSIS — E890 Postprocedural hypothyroidism: Secondary | ICD-10-CM | POA: Diagnosis not present

## 2019-04-15 DIAGNOSIS — E1122 Type 2 diabetes mellitus with diabetic chronic kidney disease: Secondary | ICD-10-CM | POA: Diagnosis present

## 2019-04-15 DIAGNOSIS — K219 Gastro-esophageal reflux disease without esophagitis: Secondary | ICD-10-CM | POA: Diagnosis present

## 2019-04-15 DIAGNOSIS — I5032 Chronic diastolic (congestive) heart failure: Secondary | ICD-10-CM | POA: Diagnosis not present

## 2019-04-15 DIAGNOSIS — Z91040 Latex allergy status: Secondary | ICD-10-CM

## 2019-04-15 DIAGNOSIS — Z853 Personal history of malignant neoplasm of breast: Secondary | ICD-10-CM

## 2019-04-15 DIAGNOSIS — I714 Abdominal aortic aneurysm, without rupture: Secondary | ICD-10-CM | POA: Diagnosis present

## 2019-04-15 DIAGNOSIS — Z9221 Personal history of antineoplastic chemotherapy: Secondary | ICD-10-CM

## 2019-04-15 DIAGNOSIS — E1142 Type 2 diabetes mellitus with diabetic polyneuropathy: Secondary | ICD-10-CM | POA: Diagnosis present

## 2019-04-15 DIAGNOSIS — Z79890 Hormone replacement therapy (postmenopausal): Secondary | ICD-10-CM

## 2019-04-15 DIAGNOSIS — G8929 Other chronic pain: Secondary | ICD-10-CM | POA: Diagnosis not present

## 2019-04-15 DIAGNOSIS — Z7982 Long term (current) use of aspirin: Secondary | ICD-10-CM

## 2019-04-15 DIAGNOSIS — Z90710 Acquired absence of both cervix and uterus: Secondary | ICD-10-CM

## 2019-04-15 DIAGNOSIS — Z981 Arthrodesis status: Secondary | ICD-10-CM

## 2019-04-15 DIAGNOSIS — Z794 Long term (current) use of insulin: Secondary | ICD-10-CM

## 2019-04-15 DIAGNOSIS — Z885 Allergy status to narcotic agent status: Secondary | ICD-10-CM

## 2019-04-15 LAB — CBC
HCT: 36.9 % (ref 36.0–46.0)
Hemoglobin: 11.7 g/dL — ABNORMAL LOW (ref 12.0–15.0)
MCH: 29.5 pg (ref 26.0–34.0)
MCHC: 31.7 g/dL (ref 30.0–36.0)
MCV: 93.2 fL (ref 80.0–100.0)
Platelets: 271 10*3/uL (ref 150–400)
RBC: 3.96 MIL/uL (ref 3.87–5.11)
RDW: 14.5 % (ref 11.5–15.5)
WBC: 10.2 10*3/uL (ref 4.0–10.5)
nRBC: 0 % (ref 0.0–0.2)

## 2019-04-15 LAB — RESPIRATORY PANEL BY RT PCR (FLU A&B, COVID)
Influenza A by PCR: NEGATIVE
Influenza B by PCR: NEGATIVE
SARS Coronavirus 2 by RT PCR: NEGATIVE

## 2019-04-15 LAB — CBG MONITORING, ED
Glucose-Capillary: 141 mg/dL — ABNORMAL HIGH (ref 70–99)
Glucose-Capillary: 113 mg/dL — ABNORMAL HIGH (ref 70–99)
Glucose-Capillary: 103 mg/dL — ABNORMAL HIGH (ref 70–99)

## 2019-04-15 LAB — TROPONIN I (HIGH SENSITIVITY)
Troponin I (High Sensitivity): 19 ng/L — ABNORMAL HIGH (ref <18)
Troponin I (High Sensitivity): 21 ng/L — ABNORMAL HIGH (ref <18)

## 2019-04-15 LAB — BASIC METABOLIC PANEL
Anion gap: 10 (ref 5–15)
BUN: 28 mg/dL — ABNORMAL HIGH (ref 8–23)
CO2: 22 mmol/L (ref 22–32)
Calcium: 10 mg/dL (ref 8.9–10.3)
Chloride: 108 mmol/L (ref 98–111)
Creatinine, Ser: 1.27 mg/dL — ABNORMAL HIGH (ref 0.44–1.00)
GFR calc Af Amer: 50 mL/min — ABNORMAL LOW (ref >60)
GFR calc non Af Amer: 43 mL/min — ABNORMAL LOW (ref >60)
Glucose, Bld: 153 mg/dL — ABNORMAL HIGH (ref 70–99)
Potassium: 4.6 mmol/L (ref 3.5–5.1)
Sodium: 140 mmol/L (ref 135–145)

## 2019-04-15 LAB — SARS CORONAVIRUS 2 AG (30 MIN TAT): SARS Coronavirus 2 Ag: NEGATIVE

## 2019-04-15 MED ORDER — ALBUTEROL SULFATE HFA 108 (90 BASE) MCG/ACT IN AERS
2.0000 | INHALATION_SPRAY | RESPIRATORY_TRACT | Status: DC | PRN
  Administered 2019-04-15: 20:00:00 2 via RESPIRATORY_TRACT
  Filled 2019-04-15: qty 6.7

## 2019-04-15 MED ORDER — SODIUM CHLORIDE 0.9% FLUSH
3.0000 mL | Freq: Once | INTRAVENOUS | Status: DC
  Filled 2019-04-15: qty 3

## 2019-04-15 MED ORDER — IOHEXOL 350 MG/ML SOLN
100.0000 mL | Freq: Once | INTRAVENOUS | Status: AC | PRN
  Administered 2019-04-15: 21:00:00 100 mL via INTRAVENOUS

## 2019-04-15 NOTE — ED Notes (Signed)
Patient transported to CT 

## 2019-04-15 NOTE — Telephone Encounter (Signed)
Pt informed of PCP recommendations. Stated an understanding. Advised that she did not feel comfortable going to the hospital but she would proceed as this was Dr. Birdie Riddle recommendation,

## 2019-04-15 NOTE — Telephone Encounter (Signed)
Pt called in complaining of Chest Tightness and Pain. States that this has been ongoing for a couple of months. But has worsened in the last month. The pain does not go away. Pt denies taking her pantoprazole and states that its not muscular due to tizanidine not helping.   Pt states that the wheeze is worse when she is up and walking around or talking. The only thing that make her feel better is lying down.   Pt has some cough, Sore throat, and chills. Also has had a decreased appetite (family tells her she eats like a bird), forces herself to eat due to DM. BS fasting has been in low 100s, some days 79-89. Pt states she is taking all of her BP and DM meds. Denies any body aches, fever.

## 2019-04-15 NOTE — ED Notes (Signed)
Pt ambulatory to BR- slow steady gait.

## 2019-04-15 NOTE — ED Notes (Signed)
Pt family (husband and daughter) updated with plan of care.

## 2019-04-15 NOTE — ED Triage Notes (Signed)
Pt c/o CP, SOB, cough x 68month-NAD-steady gait

## 2019-04-15 NOTE — Plan of Care (Signed)
69 yo female with h/o breast cancer s/p R mastectomy, gerd, hypothyroidism, hypertension, Dm2, apparently presents with c/o chest pain x1 month.  ER notes slight hypoxia ?[name] Kitchen   Wbc 10.2, Hgb 11.7, Plt 271 Na 140, K 4.6, Bun 28, Creatinine 1.27 Trop 19 -> 21 EKG Nsr at 75, nl axis, no st- t changes c/w ischemia CTA chest IMPRESSION: 1. Evaluation for pulmonary emboli is somewhat limited by respiratory motion artifact. Given this limitation, no PE was identified. 2. Marked dilatation of the main pulmonary artery, suggestive of pulmonary arterial hypertension. 3. Cardiomegaly. 4. Ascending thoracic aortic aneurysm measuring approximately 4 cm.  Ed requesting observation admission for evaluation chest pain with mild trop elevation.

## 2019-04-15 NOTE — ED Notes (Signed)
Pt assisted to BR- upon returning to room pt SpO2 at 85% with labored breathing. 2L [place] applied. Pt SpO2 increased to 94. Pt also c/o increased chest pain with exertion. EDP informed.

## 2019-04-15 NOTE — Telephone Encounter (Signed)
Given her multiple risk factors- DM, obesity, CAD, age- new onset cough, worsening shortness of breath, chills, CP she needs immediate ER evaluation.

## 2019-04-15 NOTE — ED Provider Notes (Signed)
[facility] EMERGENCY DEPARTMENT Provider Note   CSN: [id number] Arrival date & time: 04/15/19  1109     History Chief Complaint  Patient presents with  . Chest Pain    [name] is a 69 y.o. female.  HPI Patient presents with chest pain.  She has had over the last 2 months.  States she wheezes when she attempts to walk.  States she has mostly constant mid chest pain.  Worse with exertion however.  States she is not able to do the same physical activity because she becomes more short of breath and her chest hurts more.  States she has seen Dr. Acie Fredrickson in the past for chest pain.  No fevers.  No sick contacts.  Has increased swelling in her legs    Past Medical History:  Diagnosis Date  . Anginal pain ([place]) 03/19/2012   saw Dr. Cathie Olden .. she thinks its more related to stomach issues  . Arthritis    "all over; mostly in my back" (03/20/2012)  . Breast cancer ([place])    "right" (03/20/2012)  . Cardiomyopathy   . Chest pain 06/2005   Hospitalized, dystolic dysfunction  . Chronic lower back pain    "have 2 herniated discs; going to have to have a fusion" (03/20/2012)  . Family history of anesthesia complication    "father has nausea and vomiting"  . Gallstones   . GERD (gastroesophageal reflux disease)   . H/O hiatal hernia   . History of kidney stones   . History of right mastectomy   . Hypertension    "patient states never had HTN, takes Hyzaar for heart  . Hypothyroidism   . Irritable bowel syndrome   . Migraines    "it's been a long time since I've had one" (03/20/2012)  . Osteoarthritis   . Peripheral neuropathy   . PONV (postoperative nausea and vomiting)   . Sciatica   . Type II diabetes mellitus [facility])     Patient Active Problem List   Diagnosis Date Noted  . Neuropathic pain of right flank 12/27/2018  . Dysphagia 12/27/2018  . Postsurgical hypothyroidism 06/11/2018  . Vitamin D deficiency 06/11/2018  . Multinodular goiter 06/11/2018  .  Hypercalcemia 06/11/2018  . DM type 2 causing vascular disease ([place])   . PONV (postoperative nausea and vomiting)   . Peripheral neuropathy   . Irritable bowel syndrome   . Hypertension   . History of right mastectomy   . H/O hiatal hernia   . GERD (gastroesophageal reflux disease)   . Family history of anesthesia complication   . Diabetes mellitus type 2, uncontrolled, with complications ([place])   . Lower extremity edema   . Elevated d-dimer   . Elevated troponin   . Chronic constipation 06/23/2015  . Morbid obesity ([place]) 06/12/2015  . Chronic diastolic heart failure ([place]) 11/17/2014  . Herniated nucleus pulposus, L2-3 right 02/04/2014    Class: Acute  . Urinary retention 05/04/2012    Class: Temporary  . Spondylolisthesis of lumbar region 04/30/2012    Class: Chronic  . Lumbar stenosis with neurogenic claudication 04/30/2012    Class: Chronic  . Anginal pain ([place]) 03/19/2012  . SYNCOPE-CAROTID SINUS 09/16/2008  . Essential hypertension, benign 08/05/2008  . OSTEOARTHRITIS 08/23/2006  . DDD (degenerative disc disease), cervical 08/23/2006  . [place] DISEASE, LUMBAR 08/23/2006  . Headache(784.0) 08/23/2006  . BREAST CANCER, HX OF 08/23/2006  . HYPERPARATHYROIDISM, HX OF 08/23/2006  . RENAL CALCULUS, HX OF 08/23/2006    Past  Surgical History:  Procedure Laterality Date  . ABDOMINAL HYSTERECTOMY  1993  . adenosin cardiolite  07/2005   (+) wall motion abnormality  . AXILLARY LYMPH NODE DISSECTION  2003   squamous cell cancer  . BACK SURGERY    . CARDIAC CATHETERIZATION  ? 2005 / 2007  . CHOLECYSTECTOMY N/A 01/24/2018   Procedure: LAPAROSCOPIC CHOLECYSTECTOMY;  Surgeon: Erroll Luna, MD;  Location: [facility];  Service: General;  Laterality: N/A;  . CT abdomen and pelvis  02/2010   Same  . ESOPHAGOGASTRODUODENOSCOPY  2005   GERD  . KNEE ARTHROSCOPY  1990's   "right" (03/20/2012)  . LUMBAR FUSION N/A 08/22/2014   Procedure: Right L2-3 and right L1-2 transforaminal lumbar  interbody fusion with pedicle screws, rods, sleeves, cages, local bone graft, Vivigen, cancellous chips;  Surgeon: Jessy Oto, MD;  Location: [facility];  Service: Orthopedics;  Laterality: N/A;  . LUMBAR LAMINECTOMY/DECOMPRESSION MICRODISCECTOMY N/A 02/04/2014   Procedure: RIGHT L2-3 MICRODISCECTOMY ;  Surgeon: Jessy Oto, MD;  Location: [facility];  Service: Orthopedics;  Laterality: N/A;  . MASTECTOMY Right   . MASTECTOMY WITH AXILLARY LYMPH NODE DISSECTION  12/2003   "right" (03/20/2012)  . POSTERIOR CERVICAL FUSION/FORAMINOTOMY  2001  . SHOULDER ARTHROSCOPY W/ ROTATOR CUFF REPAIR  2003   Left  . THYROIDECTOMY  1977   Right  . TOTAL KNEE ARTHROPLASTY  2000   Right  . [place] of abdomen  07/2005   Fatty liver / kidney stones     OB History   No obstetric history on file.     Family History  Problem Relation Age of Onset  . Hypertension Mother   . Diabetes Father   . Hypertension Father   . Hypertension Sister   . Hypertension Brother   . Hypertension Brother   . Diabetes Brother   . Diabetes Brother   . Diabetes Other        Family Hx. of Diabetes  . Hypertension Other        Family History of HTN  . Deep vein thrombosis Daughter   . Breast cancer Maternal Aunt     Social History   Tobacco Use  . Smoking status: Never Smoker  . Smokeless tobacco: Never Used  Substance Use Topics  . Alcohol use: No  . Drug use: No    Home Medications Prior to Admission medications   Medication Sig Start Date End Date Taking? Authorizing Provider  acetaminophen (TYLENOL) 500 MG tablet Take 1,000 mg by mouth every 6 (six) hours as needed for moderate pain or headache.    [provider]  amLODipine (NORVASC) 10 MG tablet TAKE 1 TABLET BY MOUTH EVERY DAY 03/29/19   Susy Frizzle, MD  aspirin 81 MG tablet Take 81 mg by mouth daily.    [provider]  Blood Glucose Calibration (TRUE METRIX LEVEL 2) Normal SOLN Use as directed 05/17/17   Susy Frizzle, MD  Blood  Glucose Monitoring Suppl (TRUE METRIX AIR GLUCOSE METER) w/Device KIT 1 kit by Does not apply route 3 (three) times daily. 05/10/17   Susy Frizzle, MD  clindamycin (CLEOCIN) 150 MG capsule Take 300 mg by mouth 4 (four) times daily.    [provider]  fluticasone (FLONASE) 50 MCG/ACT nasal spray Place 2 sprays into both nostrils daily. Patient taking differently: Place 2 sprays into both nostrils daily as needed for allergies.  05/10/17   Susy Frizzle, MD  furosemide (LASIX) 40 MG tablet TAKE 1 TABLET (  40 MG TOTAL) BY MOUTH DAILY AS NEEDED (LEG SWELLING). 01/09/19   Susy Frizzle, MD  glucose blood (TRUE METRIX BLOOD GLUCOSE TEST) test strip Use as instructed 05/10/17   Susy Frizzle, MD  insulin NPH-regular Human (70-30) 100 UNIT/ML injection Inject 60-70 Units into the skin 2 (two) times daily before a meal.    [provider]  levothyroxine (SYNTHROID) 100 MCG tablet Take 1 tablet (100 mcg total) by mouth daily. 10/18/18   Susy Frizzle, MD  losartan (COZAAR) 50 MG tablet TAKE 1 TABLET BY MOUTH EVERY DAY 03/29/19   Susy Frizzle, MD  metFORMIN (GLUCOPHAGE) 850 MG tablet Take 1 tablet (850 mg total) by mouth 2 (two) times daily with a meal. 02/28/18   Nida, Marella Chimes, MD  metoCLOPramide (REGLAN) 10 MG tablet Take 1 tablet (10 mg total) by mouth 4 (four) times daily -  before meals and at bedtime. 10/18/18   Susy Frizzle, MD  pantoprazole (PROTONIX) 40 MG tablet TAKE 1 TABLET BY MOUTH EVERY DAY 02/25/19   [name], MD  polyethylene glycol (MIRALAX / GLYCOLAX) packet Take 17 g by mouth daily as needed for moderate constipation.    [provider]  pregabalin (LYRICA) 75 MG capsule TAKE 1 CAPSULE BY MOUTH TWICE A DAY 02/12/19   [name], DPM  tiZANidine (ZANAFLEX) 4 MG tablet Take 1 tablet (4 mg total) by mouth every 6 (six) hours as needed for muscle spasms. 10/18/18   Susy Frizzle, MD  TRUEPLUS LANCETS 33G MISC Use as  directed 05/10/17   Susy Frizzle, MD    Allergies    Dilaudid [hydromorphone hcl], Lipitor [atorvastatin], Adhesive [tape], Ampicillin, Latex, and Penicillins  Review of Systems   Review of Systems  Constitutional: Negative for fever.  HENT: Negative for congestion.   Respiratory: Positive for shortness of breath and wheezing.   Cardiovascular: Positive for chest pain and leg swelling.  Gastrointestinal: Negative for abdominal pain.  Genitourinary: Negative for flank pain.  Musculoskeletal: Negative for back pain.  Skin: Negative for color change.  Neurological: Negative for weakness.  Psychiatric/Behavioral: Negative for confusion.    Physical Exam Updated Vital Signs BP (!) 145/82   Pulse 75   Temp 98.2 F (36.8 C) (Oral)   Resp 16   Ht '5\' 5"'  (1.651 m)   Wt 103.9 kg   SpO2 100%   BMI 38.11 kg/m   Physical Exam Vitals and nursing note reviewed.  HENT:     Head: Normocephalic.  Cardiovascular:     Rate and Rhythm: Normal rate and regular rhythm.     Comments: Tenderness to anterior chest wall. Pulmonary:     Breath sounds: No wheezing or rhonchi.  Musculoskeletal:     Right lower leg: Edema present.     Left lower leg: Edema present.     Comments: Pitting edema bilateral lower extremities.  Skin:    General: Skin is warm.     Capillary Refill: Capillary refill takes less than 2 seconds.  Neurological:     Mental Status: She is alert and oriented to person, place, and time.     ED Results / Procedures / Treatments   Labs (all labs ordered are listed, but only abnormal results are displayed) Labs Reviewed  BASIC METABOLIC PANEL - Abnormal; Notable for the following components:      Result Value   Glucose, Bld 153 (*)    BUN 28 (*)    Creatinine, Ser 1.27 (*)  GFR calc non Af Amer 43 (*)    GFR calc Af Amer 50 (*)    All other components within normal limits  CBC - Abnormal; Notable for the following components:   Hemoglobin 11.7 (*)    All other  components within normal limits  CBG MONITORING, ED - Abnormal; Notable for the following components:   Glucose-Capillary 141 (*)    All other components within normal limits  CBG MONITORING, ED - Abnormal; Notable for the following components:   Glucose-Capillary 113 (*)    All other components within normal limits  CBG MONITORING, ED - Abnormal; Notable for the following components:   Glucose-Capillary 103 (*)    All other components within normal limits  TROPONIN I (HIGH SENSITIVITY) - Abnormal; Notable for the following components:   Troponin I (High Sensitivity) 19 (*)    All other components within normal limits  TROPONIN I (HIGH SENSITIVITY) - Abnormal; Notable for the following components:   Troponin I (High Sensitivity) 21 (*)    All other components within normal limits  SARS CORONAVIRUS 2 AG (30 MIN TAT)  RESPIRATORY PANEL BY RT PCR (FLU A&B, COVID)    EKG EKG Interpretation  Date/Time:  Monday April 15 2019 11:28:05 EST Ventricular Rate:  72 PR Interval:  188 QRS Duration: 92 QT Interval:  390 QTC Calculation: 427 R Axis:   38 Text Interpretation: Normal sinus rhythm Nonspecific ST abnormality Abnormal ECG No significant change since last tracing Confirmed by Dorie Rank [phone]) on 04/15/2019 11:32:58 AM   Radiology CT Angio Chest PE W and/or Wo Contrast  Result Date: 04/15/2019 CLINICAL DATA:  Shortness of breath. EXAM: CT ANGIOGRAPHY CHEST WITH CONTRAST TECHNIQUE: Multidetector CT imaging of the chest was performed using the standard protocol during bolus administration of intravenous contrast. Multiplanar CT image reconstructions and MIPs were obtained to evaluate the vascular anatomy. CONTRAST:  195m OMNIPAQUE IOHEXOL 350 MG/ML SOLN COMPARISON:  March 05, 2017. FINDINGS: Cardiovascular: Evaluation for pulmonary emboli is somewhat limited by respiratory motion artifact.Given this limitation, no PE was identified. The main pulmonary artery is significantly dilated  measuring approximately 4.2 cm in diameter. There is no CT evidence of acute right heart strain. There is an ascending thoracic aortic aneurysm measuring approximately 4 cm. Atherosclerotic changes are noted of the thoracic aorta. Heart size is enlarged. There is no significant pericardial effusion. Mediastinum/Nodes: --No mediastinal or hilar lymphadenopathy. --No axillary lymphadenopathy. --No supraclavicular lymphadenopathy. --patient is status post right-sided hemithyroidectomy. --The esophagus is unremarkable Lungs/Pleura: There is a mosaic appearance of the lungs bilaterally. There is no pneumothorax. There is atelectasis at the lung bases with a trace left-sided pleural effusion. Upper Abdomen: There is likely underlying hepatic steatosis. The patient is status post prior cholecystectomy. Musculoskeletal: No chest wall abnormality. No acute or significant osseous findings. Patient is status post right-sided mastectomy. Review of the MIP images confirms the above findings. IMPRESSION: 1. Evaluation for pulmonary emboli is somewhat limited by respiratory motion artifact. Given this limitation, no PE was identified. 2. Marked dilatation of the main pulmonary artery, suggestive of pulmonary arterial hypertension. 3. Cardiomegaly. 4. Ascending thoracic aortic aneurysm measuring approximately 4 cm. Recommend annual imaging followup by CTA or MRA. This recommendation follows 2010 ACCF/AHA/AATS/ACR/ASA/SCA/SCAI/SIR/STS/SVM Guidelines for the Diagnosis and Management of Patients with Thoracic Aortic Disease. Circulation. 2010; 121:: T614-E315 Aortic aneurysm NOS (ICD10-I71.9) 5. Mosaic appearance of the lungs bilaterally, which can be seen with small vessel or small airway disease. Pulmonary hypertension or chronic thromboembolic disease can have a  similar appearance. 6. Trace left-sided pleural effusion. Aortic Atherosclerosis (ICD10-I70.0). Electronically Signed   By: Constance Holster M.D.   On: 04/15/2019 21:26    DG Chest Portable 1 View  Result Date: 04/15/2019 CLINICAL DATA:  Shortness of breath, cough EXAM: PORTABLE CHEST 1 VIEW COMPARISON:  12/25/2017 FINDINGS: Cardiomediastinal contours are stable with enlargement of central pulmonary vasculature. Lungs are clear. No signs of pleural effusion. Visualized skeletal structures are unremarkable aside from cervical spinal fusion seen at the upper margin of the film. IMPRESSION: Prominence of central pulmonary vasculature may be secondary to pulmonary arterial hypertension, no consolidation or pleural effusion. Electronically Signed   By: Zetta Bills M.D.   On: 04/15/2019 18:59    Procedures Procedures (including critical care time)  Medications Ordered in ED Medications  sodium chloride flush (NS) 0.9 % injection 3 mL (has no administration in time range)  albuterol (VENTOLIN HFA) 108 (90 Base) MCG/ACT inhaler 2 puff (2 puffs Inhalation Given 04/15/19 2009)  iohexol (OMNIPAQUE) 350 MG/ML injection 100 mL (100 mLs Intravenous Contrast Given 04/15/19 2105)    ED Course  I have reviewed the triage vital signs and the nursing notes.  Pertinent labs & imaging results that were available during my care of the patient were reviewed by me and considered in my medical decision making (see chart for details).    MDM Rules/Calculators/A&P                      Patient presents with chest pain and shortness of breath.Has been going for the last month or 2.  Chest pain comes on with exertion but also states she has wheezes with it.  Does have some edema on her legs.  Previous history of diastolic heart failure.  Troponin this minimally elevated but stable.  Stable EKG.  However when attempted ambulation patient sats went down to 85%.  Given breathing treatments.  Still will have some mild desaturations in the room.  CT scan done and showed some pulmonary artery hypertension without pulmonary embolism.  Also ascending thoracic aneurysm will need annual imaging.   However with the hypoxia will admit to hospitalist Final Clinical Impression(s) / ED Diagnoses Final diagnoses:  Nonspecific chest pain  Dyspnea, unspecified type  Hypoxia    Rx / DC Orders ED Discharge Orders    None       Davonna Belling, MD 04/15/19 2152

## 2019-04-16 ENCOUNTER — Telehealth: Payer: Self-pay | Admitting: Family Medicine

## 2019-04-16 LAB — CBC
HCT: 37.7 % (ref 36.0–46.0)
Hemoglobin: 11.8 g/dL — ABNORMAL LOW (ref 12.0–15.0)
MCH: 28.9 pg (ref 26.0–34.0)
MCHC: 31.3 g/dL (ref 30.0–36.0)
MCV: 92.2 fL (ref 80.0–100.0)
Platelets: 282 10*3/uL (ref 150–400)
RBC: 4.09 MIL/uL (ref 3.87–5.11)
RDW: 14.4 % (ref 11.5–15.5)
WBC: 8.4 10*3/uL (ref 4.0–10.5)
nRBC: 0 % (ref 0.0–0.2)

## 2019-04-16 LAB — CBG MONITORING, ED
Glucose-Capillary: 136 mg/dL — ABNORMAL HIGH (ref 70–99)
Glucose-Capillary: 154 mg/dL — ABNORMAL HIGH (ref 70–99)
Glucose-Capillary: 219 mg/dL — ABNORMAL HIGH (ref 70–99)

## 2019-04-16 LAB — GLUCOSE, CAPILLARY: Glucose-Capillary: 205 mg/dL — ABNORMAL HIGH (ref 70–99)

## 2019-04-16 LAB — CREATININE, SERUM
Creatinine, Ser: 1.75 mg/dL — ABNORMAL HIGH (ref 0.44–1.00)
GFR calc Af Amer: 34 mL/min — ABNORMAL LOW (ref >60)
GFR calc non Af Amer: 29 mL/min — ABNORMAL LOW (ref >60)

## 2019-04-16 LAB — TROPONIN I (HIGH SENSITIVITY): Troponin I (High Sensitivity): 33 ng/L — ABNORMAL HIGH (ref <18)

## 2019-04-16 LAB — LACTIC ACID, PLASMA: Lactic Acid, Venous: 1.2 mmol/L (ref 0.5–1.9)

## 2019-04-16 MED ORDER — METFORMIN HCL 850 MG PO TABS
850.0000 mg | ORAL_TABLET | Freq: Two times a day (BID) | ORAL | Status: DC
  Administered 2019-04-16: 10:00:00 850 mg via ORAL
  Filled 2019-04-16 (×2): qty 1

## 2019-04-16 MED ORDER — ASPIRIN EC 81 MG PO TBEC
81.0000 mg | DELAYED_RELEASE_TABLET | Freq: Every day | ORAL | Status: DC
  Administered 2019-04-16 – 2019-04-20 (×5): 81 mg via ORAL
  Filled 2019-04-16 (×7): qty 1

## 2019-04-16 MED ORDER — ALUM & MAG HYDROXIDE-SIMETH 200-200-20 MG/5ML PO SUSP
30.0000 mL | Freq: Once | ORAL | Status: AC
  Administered 2019-04-16: 10:00:00 30 mL via ORAL
  Filled 2019-04-16: qty 30

## 2019-04-16 MED ORDER — PANTOPRAZOLE SODIUM 40 MG PO TBEC
40.0000 mg | DELAYED_RELEASE_TABLET | Freq: Every day | ORAL | Status: DC
  Administered 2019-04-16 – 2019-04-19 (×4): 40 mg via ORAL
  Filled 2019-04-16 (×4): qty 1

## 2019-04-16 MED ORDER — MORPHINE SULFATE (PF) 2 MG/ML IV SOLN
1.0000 mg | INTRAVENOUS | Status: DC | PRN
  Administered 2019-04-16: 23:00:00 1 mg via INTRAVENOUS
  Filled 2019-04-16: qty 1

## 2019-04-16 MED ORDER — HEPARIN SODIUM (PORCINE) 5000 UNIT/ML IJ SOLN
5000.0000 [IU] | Freq: Three times a day (TID) | INTRAMUSCULAR | Status: DC
  Administered 2019-04-16 – 2019-04-19 (×8): 5000 [IU] via SUBCUTANEOUS
  Filled 2019-04-16 (×9): qty 1

## 2019-04-16 MED ORDER — METOCLOPRAMIDE HCL 10 MG PO TABS
10.0000 mg | ORAL_TABLET | Freq: Three times a day (TID) | ORAL | Status: DC
  Administered 2019-04-16 – 2019-04-20 (×15): 10 mg via ORAL
  Filled 2019-04-16 (×15): qty 1

## 2019-04-16 MED ORDER — PREGABALIN 75 MG PO CAPS
75.0000 mg | ORAL_CAPSULE | Freq: Two times a day (BID) | ORAL | Status: DC
  Administered 2019-04-16 – 2019-04-20 (×8): 75 mg via ORAL
  Filled 2019-04-16 (×11): qty 1

## 2019-04-16 MED ORDER — LIDOCAINE VISCOUS HCL 2 % MT SOLN
15.0000 mL | Freq: Once | OROMUCOSAL | Status: AC
  Administered 2019-04-16: 10:00:00 15 mL via ORAL
  Filled 2019-04-16: qty 15

## 2019-04-16 MED ORDER — INSULIN ASPART 100 UNIT/ML ~~LOC~~ SOLN
0.0000 [IU] | Freq: Three times a day (TID) | SUBCUTANEOUS | Status: DC
  Administered 2019-04-16: 09:00:00 3 [IU] via SUBCUTANEOUS
  Administered 2019-04-16: 13:00:00 5 [IU] via SUBCUTANEOUS
  Filled 2019-04-16 (×2): qty 1

## 2019-04-16 MED ORDER — LEVOTHYROXINE SODIUM 100 MCG PO TABS
100.0000 ug | ORAL_TABLET | Freq: Every day | ORAL | Status: DC
  Administered 2019-04-16 – 2019-04-20 (×5): 100 ug via ORAL
  Filled 2019-04-16 (×7): qty 1

## 2019-04-16 MED ORDER — INSULIN ASPART 100 UNIT/ML ~~LOC~~ SOLN
0.0000 [IU] | Freq: Every day | SUBCUTANEOUS | Status: DC
  Administered 2019-04-16: 23:00:00 2 [IU] via SUBCUTANEOUS

## 2019-04-16 MED ORDER — NITROGLYCERIN 0.4 MG SL SUBL: 0.4000 mg | SUBLINGUAL_TABLET | SUBLINGUAL | Status: DC | PRN

## 2019-04-16 MED ORDER — AMLODIPINE BESYLATE 10 MG PO TABS
10.0000 mg | ORAL_TABLET | Freq: Every day | ORAL | Status: DC
  Administered 2019-04-16 – 2019-04-20 (×5): 10 mg via ORAL
  Filled 2019-04-16: qty 1
  Filled 2019-04-16: qty 2
  Filled 2019-04-16 (×3): qty 1

## 2019-04-16 MED ORDER — LOSARTAN POTASSIUM 50 MG PO TABS
50.0000 mg | ORAL_TABLET | Freq: Every day | ORAL | Status: DC
  Administered 2019-04-16: 10:00:00 50 mg via ORAL
  Filled 2019-04-16: qty 1
  Filled 2019-04-16: qty 2

## 2019-04-16 MED ORDER — INSULIN ASPART 100 UNIT/ML ~~LOC~~ SOLN
0.0000 [IU] | SUBCUTANEOUS | Status: DC
  Administered 2019-04-17 – 2019-04-18 (×9): 2 [IU] via SUBCUTANEOUS
  Administered 2019-04-18: 1 [IU] via SUBCUTANEOUS
  Administered 2019-04-19: 12:00:00 2 [IU] via SUBCUTANEOUS
  Administered 2019-04-19 (×2): 1 [IU] via SUBCUTANEOUS
  Administered 2019-04-19: 3 [IU] via SUBCUTANEOUS
  Administered 2019-04-19: 22:00:00 2 [IU] via SUBCUTANEOUS
  Administered 2019-04-19: 09:00:00 1 [IU] via SUBCUTANEOUS
  Administered 2019-04-20 (×2): 3 [IU] via SUBCUTANEOUS
  Administered 2019-04-20: 09:00:00 2 [IU] via SUBCUTANEOUS

## 2019-04-16 MED ORDER — FUROSEMIDE 40 MG PO TABS
40.0000 mg | ORAL_TABLET | Freq: Every day | ORAL | Status: DC | PRN
  Administered 2019-04-16 – 2019-04-17 (×2): 40 mg via ORAL
  Filled 2019-04-16 (×4): qty 1

## 2019-04-16 NOTE — ED Notes (Signed)
Pt continues to take off nasal cannula and unhook herself from monitor. Pt reminded the importance of this RN monitoring vitals and wearing the nasal cannula. Pt voiced understanding. Pt resting on stretcher at this time.

## 2019-04-16 NOTE — ED Notes (Signed)
NR at [facility]  Pharm re: pts metformin dosage not avail

## 2019-04-16 NOTE — H&P (Addendum)
TRH H&P    Patient Demographics:    [name], is a 69 y.o. female  MRN: [id number]  DOB - 06-Aug-1950  Admit Date - 04/15/2019  Referring MD/NP/PA: Davonna Belling  Outpatient Primary MD for the patient is Birdie Riddle, Aundra Millet, MD Mertie Moores - cardiology, not seen in some time per patient Laurence Spates - GI  Patient coming from: home-> med center [facility]  Chief complaint-   Wheezing, chest pain   HPI:    [name]  is a 69 y.o. female,  w h/o breast cancer, Gerd,  IBS,  hypothyroidism, hypertension, Dm2, Neuropathy, presents with c/o intermittent chest ache for the past 1 month.  Pt states that her most recent episode started at rest on morning of 04/15/19, substernal w radiation to the back.  Pt noted wheezing and called her pcp and was told to go to ER for evaluation.  Pt states that the chest pressure/ ache has been constant since then. Occasional heartburn. . Tums doesn't work. Pt denies receiving nitro for chest pain.   Pt denies fever, chills, cough, palp, n/v, abd pain, diarrhea, brbpr.   In Ed,  T 98.6, P 78 R 20, Bp 183/80  Pox 96% on RA  CTA chest IMPRESSION: 1. Evaluation for pulmonary emboli is somewhat limited by respiratory motion artifact. Given this limitation, no PE was identified. 2. Marked dilatation of the main pulmonary artery, suggestive of pulmonary arterial hypertension. 3. Cardiomegaly. 4. Ascending thoracic aortic aneurysm measuring approximately 4 cm. Recommend annual imaging followup by CTA or MRA. This recommendation follows 2010 5. Mosaic appearance of the lungs bilaterally, which can be seen with small vessel or small airway disease. Pulmonary hypertension or chronic thromboembolic disease can have a similar appearance.  6. Trace left-sided pleural effusion.  Na 140, K 4.6, Bun 28, Creatinine 1.27 Wbc 10.2, hgb 11.7, Plt 271 Trop 19-> 21-> 33    EKG - nsr at  72, nl axis, nl int, no st-t changes c/w ischemia  Wbc 8.4, Hgb 11.8, Plt 282  Sars coronavirus 2 pcr negative     Review of systems:    In addition to the HPI above,  No Fever-chills, No Headache, No changes with Vision or hearing, No problems swallowing food or Liquids, No Cough or Shortness of Breath, No Abdominal pain, No Nausea or Vomiting, bowel movements are regular, No Blood in stool or Urine, No dysuria, No new skin rashes or bruises, No new joints pains-aches,  No new weakness, tingling, numbness in any extremity, No recent weight gain or loss, No polyuria, polydypsia or polyphagia, No significant Mental Stressors.  All other systems reviewed and are negative.    Past History of the following :    Past Medical History:  Diagnosis Date  . Anginal pain ([place]) 03/19/2012   saw Dr. Cathie Olden .. she thinks its more related to stomach issues  . Arthritis    "all over; mostly in my back" (03/20/2012)  . Breast cancer ([place])    "right" (03/20/2012)  . Cardiomyopathy   . Chest pain  06/2005   Hospitalized, dystolic dysfunction  . Chronic lower back pain    "have 2 herniated discs; going to have to have a fusion" (03/20/2012)  . Family history of anesthesia complication    "father has nausea and vomiting"  . Gallstones   . GERD (gastroesophageal reflux disease)   . H/O hiatal hernia   . History of kidney stones   . History of right mastectomy   . Hypertension    "patient states never had HTN, takes Hyzaar for heart  . Hypothyroidism   . Irritable bowel syndrome   . Migraines    "it's been a long time since I've had one" (03/20/2012)  . Osteoarthritis   . Peripheral neuropathy   . PONV (postoperative nausea and vomiting)   . Sciatica   . Type II diabetes mellitus ([place])       Past Surgical History:  Procedure Laterality Date  . ABDOMINAL HYSTERECTOMY  1993  . adenosin cardiolite  07/2005   (+) wall motion abnormality  . AXILLARY LYMPH NODE DISSECTION  2003     squamous cell cancer  . BACK SURGERY    . CARDIAC CATHETERIZATION  ? 2005 / 2007  . CHOLECYSTECTOMY N/A 01/24/2018   Procedure: LAPAROSCOPIC CHOLECYSTECTOMY;  Surgeon: Erroll Luna, MD;  Location: [facility];  Service: General;  Laterality: N/A;  . CT abdomen and pelvis  02/2010   Same  . ESOPHAGOGASTRODUODENOSCOPY  2005   GERD  . KNEE ARTHROSCOPY  1990's   "right" (03/20/2012)  . LUMBAR FUSION N/A 08/22/2014   Procedure: Right L2-3 and right L1-2 transforaminal lumbar interbody fusion with pedicle screws, rods, sleeves, cages, local bone graft, Vivigen, cancellous chips;  Surgeon: Jessy Oto, MD;  Location: [facility];  Service: Orthopedics;  Laterality: N/A;  . LUMBAR LAMINECTOMY/DECOMPRESSION MICRODISCECTOMY N/A 02/04/2014   Procedure: RIGHT L2-3 MICRODISCECTOMY ;  Surgeon: Jessy Oto, MD;  Location: [facility];  Service: Orthopedics;  Laterality: N/A;  . MASTECTOMY Right   . MASTECTOMY WITH AXILLARY LYMPH NODE DISSECTION  12/2003   "right" (03/20/2012)  . POSTERIOR CERVICAL FUSION/FORAMINOTOMY  2001  . SHOULDER ARTHROSCOPY W/ ROTATOR CUFF REPAIR  2003   Left  . THYROIDECTOMY  1977   Right  . TOTAL KNEE ARTHROPLASTY  2000   Right  . [place] of abdomen  07/2005   Fatty liver / kidney stones      Social History:      Social History   Tobacco Use  . Smoking status: Never Smoker  . Smokeless tobacco: Never Used  Substance Use Topics  . Alcohol use: No       Family History :     Family History  Problem Relation Age of Onset  . Hypertension Mother   . Diabetes Father   . Hypertension Father   . Hypertension Sister   . Hypertension Brother   . Hypertension Brother   . Diabetes Brother   . Diabetes Brother   . Diabetes Other        Family Hx. of Diabetes  . Hypertension Other        Family History of HTN  . Deep vein thrombosis Daughter   . Breast cancer Maternal Aunt       Home Medications:   Prior to Admission medications   Medication Sig Start Date End Date  Taking? Authorizing Provider  acetaminophen (TYLENOL) 500 MG tablet Take 1,000 mg by mouth every 6 (six) hours as needed for moderate pain or headache.    [provider]  amLODipine (NORVASC) 10 MG tablet TAKE 1 TABLET BY MOUTH EVERY DAY 03/29/19   Pickard, Warren T, MD  aspirin 81 MG tablet Take 81 mg by mouth daily.    [provider]  Blood Glucose Calibration (TRUE METRIX LEVEL 2) Normal SOLN Use as directed 05/17/17   Pickard, Warren T, MD  Blood Glucose Monitoring Suppl (TRUE METRIX AIR GLUCOSE METER) w/Device KIT 1 kit by Does not apply route 3 (three) times daily. 05/10/17   Pickard, Warren T, MD  clindamycin (CLEOCIN) 150 MG capsule Take 300 mg by mouth 4 (four) times daily.    [provider]  fluticasone (FLONASE) 50 MCG/ACT nasal spray Place 2 sprays into both nostrils daily. Patient taking differently: Place 2 sprays into both nostrils daily as needed for allergies.  05/10/17   Pickard, Warren T, MD  furosemide (LASIX) 40 MG tablet TAKE 1 TABLET (40 MG TOTAL) BY MOUTH DAILY AS NEEDED (LEG SWELLING). 01/09/19   Pickard, Warren T, MD  glucose blood (TRUE METRIX BLOOD GLUCOSE TEST) test strip Use as instructed 05/10/17   Pickard, Warren T, MD  insulin NPH-regular Human (70-30) 100 UNIT/ML injection Inject 60-70 Units into the skin 2 (two) times daily before a meal.    [provider]  levothyroxine (SYNTHROID) 100 MCG tablet Take 1 tablet (100 mcg total) by mouth daily. 10/18/18   Pickard, Warren T, MD  losartan (COZAAR) 50 MG tablet TAKE 1 TABLET BY MOUTH EVERY DAY 03/29/19   Pickard, Warren T, MD  metFORMIN (GLUCOPHAGE) 850 MG tablet Take 1 tablet (850 mg total) by mouth 2 (two) times daily with a meal. 02/28/18   Nida, Gebreselassie W, MD  metoCLOPramide (REGLAN) 10 MG tablet Take 1 tablet (10 mg total) by mouth 4 (four) times daily -  before meals and at bedtime. 10/18/18   Pickard, Warren T, MD  pantoprazole (PROTONIX) 40 MG tablet TAKE 1 TABLET BY MOUTH  EVERY DAY 02/25/19   Tabori, Katherine E, MD  polyethylene glycol (MIRALAX / GLYCOLAX) packet Take 17 g by mouth daily as needed for moderate constipation.    [provider]  pregabalin (LYRICA) 75 MG capsule TAKE 1 CAPSULE BY MOUTH TWICE A DAY 02/12/19   Wagoner, Matthew R, DPM  tiZANidine (ZANAFLEX) 4 MG tablet Take 1 tablet (4 mg total) by mouth every 6 (six) hours as needed for muscle spasms. 10/18/18   Pickard, Warren T, MD  TRUEPLUS LANCETS 33G MISC Use as directed 05/10/17   Pickard, Warren T, MD     Allergies:     Allergies  Allergen Reactions  . Dilaudid [Hydromorphone Hcl] Nausea And Vomiting  . Lipitor [Atorvastatin] Other (See Comments)    Body & Muscle Aches  . Adhesive [Tape] Rash  . Ampicillin Rash and Other (See Comments)    REACTION: Rash and welts on her hands when she took it in the hospital  . Latex Itching, Dermatitis, Rash and Other (See Comments)    REACTION: Patient had a reaction to tape after surgery and they told her she was allergic to Latex 03/20/2012 "if it's on too long it will break me out"  . Penicillins Rash and Other (See Comments)    Has patient had a PCN reaction causing immediate rash, facial/tongue/throat swelling, SOB or lightheadedness with hypotension: No Has patient had a PCN reaction causing severe rash involving mucus membranes or skin necrosis: No Has patient had a PCN reaction that required hospitalization: Yes - in hospital receiving treatment Has patient had   a PCN reaction occurring within the last 10 years: No If all of the above answers are "NO", then may proceed with Cephalosporin use.      Physical Exam:   Vitals  Blood pressure (!) 139/113, pulse 78, temperature 98.3 F (36.8 C), temperature source Oral, resp. rate (!) 22, height 5' 5" (1.651 m), weight 103.9 kg, SpO2 91 %.  1.  General: axoxo3  2. Psychiatric: euthymic  3. Neurologic: nonfocal  4. HEENMT:  Anicteric, pupils 1.5mm symmetric, direct, consensual  , intact Neck: no jvd  5. Respiratory : CTAB  6. Cardiovascular : Slight tenderness with palpation of chest wall rrr s1, s2, no m/g/r  7. Gastrointestinal:  Abd: soft, obese, nt, nd, +bs  8. Skin:  Ext: no c/c/e, no rash  9.Musculoskeletal:  Good ROM    Data Review:    CBC Recent Labs  Lab 04/15/19 1415 04/16/19 2106  WBC 10.2 8.4  HGB 11.7* 11.8*  HCT 36.9 37.7  PLT 271 282  MCV 93.2 92.2  MCH 29.5 28.9  MCHC 31.7 31.3  RDW 14.5 14.4   ------------------------------------------------------------------------------------------------------------------  Results for orders placed or performed during the hospital encounter of 04/15/19 (from the past 48 hour(s))  CBG monitoring, ED     Status: Abnormal   Collection Time: 04/15/19  1:57 PM  Result Value Ref Range   Glucose-Capillary 141 (H) 70 - 99 mg/dL   Comment 1 Notify RN   Basic metabolic panel     Status: Abnormal   Collection Time: 04/15/19  2:15 PM  Result Value Ref Range   Sodium 140 135 - 145 mmol/L   Potassium 4.6 3.5 - 5.1 mmol/L   Chloride 108 98 - 111 mmol/L   CO2 22 22 - 32 mmol/L   Glucose, Bld 153 (H) 70 - 99 mg/dL   BUN 28 (H) 8 - 23 mg/dL   Creatinine, Ser 1.27 (H) 0.44 - 1.00 mg/dL   Calcium 10.0 8.9 - 10.3 mg/dL   GFR calc non Af Amer 43 (L) >60 mL/min   GFR calc Af Amer 50 (L) >60 mL/min   Anion gap 10 5 - 15    Comment: Performed at [facility] [place], [address] [place], [place] [address]  CBC     Status: Abnormal   Collection Time: 04/15/19  2:15 PM  Result Value Ref Range   WBC 10.2 4.0 - 10.5 K/uL   RBC 3.96 3.87 - 5.11 MIL/uL   Hemoglobin 11.7 (L) 12.0 - 15.0 g/dL   HCT 36.9 36.0 - 46.0 %   MCV 93.2 80.0 - 100.0 fL   MCH 29.5 26.0 - 34.0 pg   MCHC 31.7 30.0 - 36.0 g/dL   RDW 14.5 11.5 - 15.5 %   Platelets 271 150 - 400 K/uL   nRBC 0.0 0.0 - 0.2 %    Comment: Performed at [facility] [place], [address] [place], [place] [address]  Troponin I (High  Sensitivity)     Status: Abnormal   Collection Time: 04/15/19  2:15 PM  Result Value Ref Range   Troponin I (High Sensitivity) 19 (H) <18 ng/L    Comment: (NOTE) Elevated high sensitivity troponin I (hsTnI) values and significant  changes across serial measurements may suggest ACS but many other  chronic and acute conditions are known to elevate hsTnI results.  Refer to the "Links" section for chest pain algorithms and additional  guidance. Performed at [facility] [place], [address] [place], [place] [address]   SARS Coronavirus 2 Ag (30 min TAT) - Nasal Swab (BD Veritor Kit)     Status: None   Collection Time: 04/15/19  3:10 PM   Specimen: Nasal Swab (BD Veritor Kit)  Result Value Ref Range   SARS Coronavirus 2 Ag NEGATIVE NEGATIVE    Comment: (NOTE) SARS-CoV-2 antigen NOT DETECTED.  Negative results are presumptive.  Negative results do not preclude SARS-CoV-2 infection and should not be used as the sole basis for treatment or other patient management decisions, including infection  control decisions, particularly in the presence of clinical signs and  symptoms consistent with COVID-19, or in those who have been in contact with the virus.  Negative results must be combined with clinical observations, patient history, and epidemiological information. The expected result is Negative. Fact Sheet for Patients: PodPark.tn Fact Sheet for Healthcare Providers: GiftContent.is This test is not yet approved or cleared by the [place] FDA and  has been authorized for detection and/or diagnosis of SARS-CoV-2 by FDA under an Emergency Use Authorization (EUA).  This EUA will remain in effect (meaning this test can be used) for the duration of  the COVID-19 de claration under Section 564(b)(1) of the Act, 21 U.S.C. section 360bbb-3(b)(1), unless the authorization is terminated or revoked sooner. Performed at [facility], [address] [place], [place] [address]   POC CBG, ED     Status: Abnormal   Collection Time: 04/15/19  5:47 PM  Result Value Ref Range   Glucose-Capillary 113 (H) 70 - 99 mg/dL   Comment 1 Notify RN    Comment 2 Document in Chart   Troponin I (High Sensitivity)     Status: Abnormal   Collection Time: 04/15/19  7:20 PM  Result Value Ref Range   Troponin I (High Sensitivity) 21 (H) <18 ng/L    Comment: (NOTE) Elevated high sensitivity troponin I (hsTnI) values and significant  changes across serial measurements may suggest ACS but many other  chronic and acute conditions are known to elevate hsTnI results.  Refer to the "Links" section for chest pain algorithms and additional  guidance. Performed at [facility], [address] [place], [place] [address]   Respiratory Panel by RT PCR (Flu A&B, Covid) - Nasopharyngeal Swab     Status: None   Collection Time: 04/15/19  7:45 PM   Specimen: Nasopharyngeal Swab  Result Value Ref Range   SARS Coronavirus 2 by RT PCR NEGATIVE NEGATIVE    Comment: (NOTE) SARS-CoV-2 target nucleic acids are NOT DETECTED. The SARS-CoV-2 RNA is generally detectable in upper respiratoy specimens during the acute phase of infection. The lowest concentration of SARS-CoV-2 viral copies this assay can detect is 131 copies/mL. A negative result does not preclude SARS-Cov-2 infection and should not be used as the sole basis for treatment or other patient management decisions. A negative result may occur with  improper specimen collection/handling, submission of specimen other than nasopharyngeal swab, presence of viral mutation(s) within the areas targeted by this assay, and inadequate number of viral copies (<131 copies/mL). A negative result must be combined with clinical observations, patient history, and epidemiological information. The expected result is Negative. Fact Sheet for Patients:    PinkCheek.be Fact Sheet for Healthcare Providers:  GravelBags.it This test is not yet ap proved or cleared by the [place] FDA and  has been authorized for detection and/or diagnosis of SARS-CoV-2 by FDA under an Emergency Use Authorization (  EUA). This EUA will remain  in effect (meaning this test can be used) for the duration of the COVID-19 declaration under Section 564(b)(1) of the Act, 21 U.S.C. section 360bbb-3(b)(1), unless the authorization is terminated or revoked sooner.    Influenza A by PCR NEGATIVE NEGATIVE   Influenza B by PCR NEGATIVE NEGATIVE    Comment: (NOTE) The Xpert Xpress SARS-CoV-2/FLU/RSV assay is intended as an aid in  the diagnosis of influenza from Nasopharyngeal swab specimens and  should not be used as a sole basis for treatment. Nasal washings and  aspirates are unacceptable for Xpert Xpress SARS-CoV-2/FLU/RSV  testing. Fact Sheet for Patients: PinkCheek.be Fact Sheet for Healthcare Providers: GravelBags.it This test is not yet approved or cleared by the [place] FDA and  has been authorized for detection and/or diagnosis of SARS-CoV-2 by  FDA under an Emergency Use Authorization (EUA). This EUA will remain  in effect (meaning this test can be used) for the duration of the  Covid-19 declaration under Section 564(b)(1) of the Act, 21  U.S.C. section 360bbb-3(b)(1), unless the authorization is  terminated or revoked. Performed at [facility], [place] [address], [place], [place] [address]   CBG monitoring, ED     Status: Abnormal   Collection Time: 04/15/19  8:31 PM  Result Value Ref Range   Glucose-Capillary 103 (H) 70 - 99 mg/dL  CBG monitoring, ED     Status: Abnormal   Collection Time: 04/16/19 12:51 AM  Result Value Ref Range   Glucose-Capillary 136 (H) 70 - 99 mg/dL  CBG monitoring, ED     Status: Abnormal    Collection Time: 04/16/19  8:35 AM  Result Value Ref Range   Glucose-Capillary 154 (H) 70 - 99 mg/dL  POC CBG, ED     Status: Abnormal   Collection Time: 04/16/19 12:14 PM  Result Value Ref Range   Glucose-Capillary 219 (H) 70 - 99 mg/dL  CBC     Status: Abnormal   Collection Time: 04/16/19  9:06 PM  Result Value Ref Range   WBC 8.4 4.0 - 10.5 K/uL   RBC 4.09 3.87 - 5.11 MIL/uL   Hemoglobin 11.8 (L) 12.0 - 15.0 g/dL   HCT 37.7 36.0 - 46.0 %   MCV 92.2 80.0 - 100.0 fL   MCH 28.9 26.0 - 34.0 pg   MCHC 31.3 30.0 - 36.0 g/dL   RDW 14.4 11.5 - 15.5 %   Platelets 282 150 - 400 K/uL   nRBC 0.0 0.0 - 0.2 %    Comment: Performed at [facility], [place]. [address] [place], [place] [address]  Creatinine, serum     Status: Abnormal   Collection Time: 04/16/19  9:06 PM  Result Value Ref Range   Creatinine, Ser 1.75 (H) 0.44 - 1.00 mg/dL   GFR calc non Af Amer 29 (L) >60 mL/min   GFR calc Af Amer 34 (L) >60 mL/min    Comment: Performed at [facility] [address] [place], [place] [address]  Troponin I (High Sensitivity)     Status: Abnormal   Collection Time: 04/16/19  9:25 PM  Result Value Ref Range   Troponin I (High Sensitivity) 33 (H) <18 ng/L    Comment: (NOTE) Elevated high sensitivity troponin I (hsTnI) values and significant  changes across serial measurements may suggest ACS but many other  chronic and acute conditions are known to elevate hsTnI results.  Refer to the "Links" section for chest pain algorithms  and additional  guidance. Performed at [facility], [place] [address] [place], [place] [address]   Glucose, capillary     Status: Abnormal   Collection Time: 04/16/19  9:40 PM  Result Value Ref Range   Glucose-Capillary 205 (H) 70 - 99 mg/dL   Comment 1 Notify RN    Comment 2 Document in Chart     Chemistries  Recent Labs  Lab 04/15/19 1415 04/16/19 2106  NA 140  --   K 4.6  --   CL 108  --   CO2 22  --   GLUCOSE 153*  --   BUN 28*  --     CREATININE 1.27* 1.75*  CALCIUM 10.0  --    ------------------------------------------------------------------------------------------------------------------  ------------------------------------------------------------------------------------------------------------------ GFR: Estimated Creatinine Clearance: 35.8 mL/min (A) (by C-G formula based on SCr of 1.75 mg/dL (H)). Liver Function Tests: No results for input(s): AST, ALT, ALKPHOS, BILITOT, PROT, ALBUMIN in the last 168 hours. No results for input(s): LIPASE, AMYLASE in the last 168 hours. No results for input(s): AMMONIA in the last 168 hours. Coagulation Profile: No results for input(s): INR, PROTIME in the last 168 hours. Cardiac Enzymes: No results for input(s): CKTOTAL, CKMB, CKMBINDEX, TROPONINI in the last 168 hours. BNP (last 3 results) No results for input(s): PROBNP in the last 8760 hours. HbA1C: No results for input(s): HGBA1C in the last 72 hours. CBG: Recent Labs  Lab 04/15/19 2031 04/16/19 0051 04/16/19 0835 04/16/19 1214 04/16/19 2140  GLUCAP 103* 136* 154* 219* 205*   Lipid Profile: No results for input(s): CHOL, HDL, LDLCALC, TRIG, CHOLHDL, LDLDIRECT in the last 72 hours. Thyroid Function Tests: No results for input(s): TSH, T4TOTAL, FREET4, T3FREE, THYROIDAB in the last 72 hours. Anemia Panel: No results for input(s): VITAMINB12, FOLATE, FERRITIN, TIBC, IRON, RETICCTPCT in the last 72 hours.  --------------------------------------------------------------------------------------------------------------- Urine analysis:    Component Value Date/Time   COLORURINE YELLOW 12/25/2017 2051   APPEARANCEUR CLEAR 12/25/2017 2051   LABSPEC 1.015 12/25/2017 2051   PHURINE 7.0 12/25/2017 2051   GLUCOSEU >=500 (A) 12/25/2017 2051   HGBUR TRACE (A) 12/25/2017 2051   HGBUR negative 02/25/2010 1034   BILIRUBINUR NEGATIVE 12/25/2017 2051   [place] 12/25/2017 2051   PROTEINUR 100 (A) 12/25/2017 2051    UROBILINOGEN 0.2 09/13/2014 1725   NITRITE NEGATIVE 12/25/2017 2051   LEUKOCYTESUR NEGATIVE 12/25/2017 2051      Imaging Results:    CT Angio Chest PE W and/or Wo Contrast  Result Date: 04/15/2019 CLINICAL DATA:  Shortness of breath. EXAM: CT ANGIOGRAPHY CHEST WITH CONTRAST TECHNIQUE: Multidetector CT imaging of the chest was performed using the standard protocol during bolus administration of intravenous contrast. Multiplanar CT image reconstructions and MIPs were obtained to evaluate the vascular anatomy. CONTRAST:  131m OMNIPAQUE IOHEXOL 350 MG/ML SOLN COMPARISON:  March 05, 2017. FINDINGS: Cardiovascular: Evaluation for pulmonary emboli is somewhat limited by respiratory motion artifact.Given this limitation, no PE was identified. The main pulmonary artery is significantly dilated measuring approximately 4.2 cm in diameter. There is no CT evidence of acute right heart strain. There is an ascending thoracic aortic aneurysm measuring approximately 4 cm. Atherosclerotic changes are noted of the thoracic aorta. Heart size is enlarged. There is no significant pericardial effusion. Mediastinum/Nodes: --No mediastinal or hilar lymphadenopathy. --No axillary lymphadenopathy. --No supraclavicular lymphadenopathy. --patient is status post right-sided hemithyroidectomy. --The esophagus is unremarkable Lungs/Pleura: There is a mosaic appearance of the lungs bilaterally. There is no pneumothorax. There is atelectasis at the lung bases with  a trace left-sided pleural effusion. Upper Abdomen: There is likely underlying hepatic steatosis. The patient is status post prior cholecystectomy. Musculoskeletal: No chest wall abnormality. No acute or significant osseous findings. Patient is status post right-sided mastectomy. Review of the MIP images confirms the above findings. IMPRESSION: 1. Evaluation for pulmonary emboli is somewhat limited by respiratory motion artifact. Given this limitation, no PE was identified.  2. Marked dilatation of the main pulmonary artery, suggestive of pulmonary arterial hypertension. 3. Cardiomegaly. 4. Ascending thoracic aortic aneurysm measuring approximately 4 cm. Recommend annual imaging followup by CTA or MRA. This recommendation follows 2010 ACCF/AHA/AATS/ACR/ASA/SCA/SCAI/SIR/STS/SVM Guidelines for the Diagnosis and Management of Patients with Thoracic Aortic Disease. Circulation. 2010; 121: E266-e369. Aortic aneurysm NOS (ICD10-I71.9) 5. Mosaic appearance of the lungs bilaterally, which can be seen with small vessel or small airway disease. Pulmonary hypertension or chronic thromboembolic disease can have a similar appearance. 6. Trace left-sided pleural effusion. Aortic Atherosclerosis (ICD10-I70.0). Electronically Signed   By: Christopher  Green M.D.   On: 04/15/2019 21:26   DG Chest Portable 1 View  Result Date: 04/15/2019 CLINICAL DATA:  Shortness of breath, cough EXAM: PORTABLE CHEST 1 VIEW COMPARISON:  12/25/2017 FINDINGS: Cardiomediastinal contours are stable with enlargement of central pulmonary vasculature. Lungs are clear. No signs of pleural effusion. Visualized skeletal structures are unremarkable aside from cervical spinal fusion seen at the upper margin of the film. IMPRESSION: Prominence of central pulmonary vasculature may be secondary to pulmonary arterial hypertension, no consolidation or pleural effusion. Electronically Signed   By: Geoffrey  Wile M.D.   On: 04/15/2019 18:59      Assessment & Plan:    Active Problems:   Chest pain    Chest pain  Tele  Trop I Check lipid Check cardiac echo NPO after MN Cont Aspirin Start Crestor 20mg po qhs Cardiology consult requested by email  Wheezing Cont Albuterol HFA 2puff q6h prn   Thoracic Aortic aneurysm Outpatient follow up , repeat CT in 1 year   Hypertension Cont Losartan 50mg po qday Cont Amlodipine 10mg po qday Cont Lasix 40mg po qday  Hypothyroidism Cont Levothyroxine 100 micrograms po  qday  Gerd Cont PPI  Dm2 w neuropathy Hold Metformin Fsbs q4h, ISS Cont Lyrica  Anemia Check cbc in am   DVT Prophylaxis-   Lovenox - SCDs   AM Labs Ordered, also please review Full Orders  Family Communication: Admission, patients condition and plan of care including tests being ordered have been discussed with the patient who indicate understanding and agree with the plan and Code Status.  Code Status:  FULL CODE per patient,   Admission status: Observation: Based on patients clinical presentation and evaluation of above clinical data, I have made determination that patient meets observation criteria at this time.   Time spent in minutes : 55 minutes   James Kim M.D on 04/16/2019 at 11:47 PM          

## 2019-04-16 NOTE — ED Notes (Signed)
[facility] called and requested  Home meds be sent over via courier.

## 2019-04-16 NOTE — ED Notes (Signed)
Pt refuses Lasix at this time

## 2019-04-16 NOTE — ED Notes (Signed)
PT room #[id number] [phone]

## 2019-04-16 NOTE — ED Notes (Signed)
Pt standing by bed states she had gotten up to go to the restroom  Assisted back to bed  Instructed pt to please use call button for future needs  Pt pleasant in conversation  No acute distress noted

## 2019-04-16 NOTE — ED Notes (Signed)
CBG - 219 

## 2019-04-16 NOTE — ED Notes (Signed)
CBG completed at the time of order per chart

## 2019-04-16 NOTE — Telephone Encounter (Signed)
Called and advised pt daughter of the PCP notes. She stated an understanding.

## 2019-04-16 NOTE — Telephone Encounter (Signed)
Daughter is stating that she is at the [facility] in [facility] and they have already did all the test they can do. She did state that they were trying to find her a room and there were talks about sending her to another hospital.

## 2019-04-16 NOTE — ED Notes (Signed)
Pt off of nasal cannula and monitor. Pt reminded the importance of staying on monitor and wearing nasal cannula. Pt voiced understanding.

## 2019-04-16 NOTE — Telephone Encounter (Signed)
Please let them know that I am following along and I am so sorry that they are stuck in such a mess.  Hospital space is at a premium everywhere but she is definitely in need of care and where she belongs

## 2019-04-17 ENCOUNTER — Encounter (HOSPITAL_COMMUNITY): Payer: Self-pay | Admitting: Internal Medicine

## 2019-04-17 ENCOUNTER — Observation Stay (HOSPITAL_COMMUNITY): Payer: Medicare HMO

## 2019-04-17 ENCOUNTER — Observation Stay (HOSPITAL_BASED_OUTPATIENT_CLINIC_OR_DEPARTMENT_OTHER): Payer: Medicare HMO

## 2019-04-17 DIAGNOSIS — D631 Anemia in chronic kidney disease: Secondary | ICD-10-CM | POA: Diagnosis present

## 2019-04-17 DIAGNOSIS — G4733 Obstructive sleep apnea (adult) (pediatric): Secondary | ICD-10-CM | POA: Diagnosis present

## 2019-04-17 DIAGNOSIS — R062 Wheezing: Secondary | ICD-10-CM | POA: Diagnosis present

## 2019-04-17 DIAGNOSIS — R079 Chest pain, unspecified: Secondary | ICD-10-CM

## 2019-04-17 DIAGNOSIS — K449 Diaphragmatic hernia without obstruction or gangrene: Secondary | ICD-10-CM | POA: Diagnosis present

## 2019-04-17 DIAGNOSIS — E1122 Type 2 diabetes mellitus with diabetic chronic kidney disease: Secondary | ICD-10-CM | POA: Diagnosis present

## 2019-04-17 DIAGNOSIS — E785 Hyperlipidemia, unspecified: Secondary | ICD-10-CM | POA: Diagnosis present

## 2019-04-17 DIAGNOSIS — R0600 Dyspnea, unspecified: Secondary | ICD-10-CM | POA: Diagnosis not present

## 2019-04-17 DIAGNOSIS — E890 Postprocedural hypothyroidism: Secondary | ICD-10-CM | POA: Diagnosis present

## 2019-04-17 DIAGNOSIS — N179 Acute kidney failure, unspecified: Secondary | ICD-10-CM | POA: Diagnosis present

## 2019-04-17 DIAGNOSIS — R0902 Hypoxemia: Secondary | ICD-10-CM | POA: Diagnosis present

## 2019-04-17 DIAGNOSIS — G8929 Other chronic pain: Secondary | ICD-10-CM | POA: Diagnosis present

## 2019-04-17 DIAGNOSIS — K589 Irritable bowel syndrome without diarrhea: Secondary | ICD-10-CM | POA: Diagnosis present

## 2019-04-17 DIAGNOSIS — K760 Fatty (change of) liver, not elsewhere classified: Secondary | ICD-10-CM | POA: Diagnosis present

## 2019-04-17 DIAGNOSIS — I712 Thoracic aortic aneurysm, without rupture: Secondary | ICD-10-CM

## 2019-04-17 DIAGNOSIS — R05 Cough: Secondary | ICD-10-CM | POA: Diagnosis not present

## 2019-04-17 DIAGNOSIS — N141 Nephropathy induced by other drugs, medicaments and biological substances: Secondary | ICD-10-CM | POA: Diagnosis not present

## 2019-04-17 DIAGNOSIS — I428 Other cardiomyopathies: Secondary | ICD-10-CM | POA: Diagnosis present

## 2019-04-17 DIAGNOSIS — I2721 Secondary pulmonary arterial hypertension: Secondary | ICD-10-CM | POA: Diagnosis present

## 2019-04-17 DIAGNOSIS — K219 Gastro-esophageal reflux disease without esophagitis: Secondary | ICD-10-CM | POA: Diagnosis present

## 2019-04-17 DIAGNOSIS — R0789 Other chest pain: Secondary | ICD-10-CM | POA: Diagnosis present

## 2019-04-17 DIAGNOSIS — I10 Essential (primary) hypertension: Secondary | ICD-10-CM

## 2019-04-17 DIAGNOSIS — I340 Nonrheumatic mitral (valve) insufficiency: Secondary | ICD-10-CM | POA: Diagnosis not present

## 2019-04-17 DIAGNOSIS — E1142 Type 2 diabetes mellitus with diabetic polyneuropathy: Secondary | ICD-10-CM | POA: Diagnosis present

## 2019-04-17 DIAGNOSIS — Z833 Family history of diabetes mellitus: Secondary | ICD-10-CM | POA: Diagnosis not present

## 2019-04-17 DIAGNOSIS — I7121 Aneurysm of the ascending aorta, without rupture: Secondary | ICD-10-CM

## 2019-04-17 DIAGNOSIS — I130 Hypertensive heart and chronic kidney disease with heart failure and stage 1 through stage 4 chronic kidney disease, or unspecified chronic kidney disease: Secondary | ICD-10-CM | POA: Diagnosis present

## 2019-04-17 DIAGNOSIS — Z20822 Contact with and (suspected) exposure to covid-19: Secondary | ICD-10-CM | POA: Diagnosis present

## 2019-04-17 DIAGNOSIS — E669 Obesity, unspecified: Secondary | ICD-10-CM | POA: Diagnosis present

## 2019-04-17 DIAGNOSIS — N183 Chronic kidney disease, stage 3 unspecified: Secondary | ICD-10-CM | POA: Diagnosis present

## 2019-04-17 DIAGNOSIS — I5032 Chronic diastolic (congestive) heart failure: Secondary | ICD-10-CM | POA: Diagnosis present

## 2019-04-17 LAB — LIPID PANEL
Cholesterol: 193 mg/dL (ref 0–200)
HDL: 41 mg/dL (ref >40)
LDL Cholesterol: 110 mg/dL — ABNORMAL HIGH (ref 0–99)
Total CHOL/HDL Ratio: 4.7 RATIO
Triglycerides: 208 mg/dL — ABNORMAL HIGH (ref <150)
VLDL: 42 mg/dL — ABNORMAL HIGH (ref 0–40)

## 2019-04-17 LAB — CBC
HCT: 32.9 % — ABNORMAL LOW (ref 36.0–46.0)
Hemoglobin: 10.3 g/dL — ABNORMAL LOW (ref 12.0–15.0)
MCH: 28.8 pg (ref 26.0–34.0)
MCHC: 31.3 g/dL (ref 30.0–36.0)
MCV: 91.9 fL (ref 80.0–100.0)
Platelets: 238 10*3/uL (ref 150–400)
RBC: 3.58 MIL/uL — ABNORMAL LOW (ref 3.87–5.11)
RDW: 14.4 % (ref 11.5–15.5)
WBC: 7.5 10*3/uL (ref 4.0–10.5)
nRBC: 0 % (ref 0.0–0.2)

## 2019-04-17 LAB — COMPREHENSIVE METABOLIC PANEL
ALT: 18 U/L (ref 0–44)
AST: 23 U/L (ref 15–41)
Albumin: 2.9 g/dL — ABNORMAL LOW (ref 3.5–5.0)
Alkaline Phosphatase: 50 U/L (ref 38–126)
Anion gap: 8 (ref 5–15)
BUN: 21 mg/dL (ref 8–23)
CO2: 24 mmol/L (ref 22–32)
Calcium: 9.8 mg/dL (ref 8.9–10.3)
Chloride: 108 mmol/L (ref 98–111)
Creatinine, Ser: 1.9 mg/dL — ABNORMAL HIGH (ref 0.44–1.00)
GFR calc Af Amer: 30 mL/min — ABNORMAL LOW (ref >60)
GFR calc non Af Amer: 26 mL/min — ABNORMAL LOW (ref >60)
Glucose, Bld: 193 mg/dL — ABNORMAL HIGH (ref 70–99)
Potassium: 4.1 mmol/L (ref 3.5–5.1)
Sodium: 140 mmol/L (ref 135–145)
Total Bilirubin: 0.5 mg/dL (ref 0.3–1.2)
Total Protein: 6.3 g/dL — ABNORMAL LOW (ref 6.5–8.1)

## 2019-04-17 LAB — GLUCOSE, CAPILLARY
Glucose-Capillary: 151 mg/dL — ABNORMAL HIGH (ref 70–99)
Glucose-Capillary: 166 mg/dL — ABNORMAL HIGH (ref 70–99)
Glucose-Capillary: 174 mg/dL — ABNORMAL HIGH (ref 70–99)
Glucose-Capillary: 182 mg/dL — ABNORMAL HIGH (ref 70–99)
Glucose-Capillary: 183 mg/dL — ABNORMAL HIGH (ref 70–99)
Glucose-Capillary: 184 mg/dL — ABNORMAL HIGH (ref 70–99)
Glucose-Capillary: 196 mg/dL — ABNORMAL HIGH (ref 70–99)

## 2019-04-17 LAB — NM MYOCAR MULTI W/SPECT W/WALL MOTION / EF
Estimated workload: 1 METS
Exercise duration (min): 0 min
Exercise duration (sec): 0 s
MPHR: 150 {beats}/min
Peak HR: 106 {beats}/min
Percent HR: 70 %
Rest HR: 67 {beats}/min

## 2019-04-17 LAB — ECHOCARDIOGRAM COMPLETE
Height: 65 in
Weight: 3492.8 oz

## 2019-04-17 LAB — LACTIC ACID, PLASMA: Lactic Acid, Venous: 1.2 mmol/L (ref 0.5–1.9)

## 2019-04-17 LAB — HIV ANTIBODY (ROUTINE TESTING W REFLEX): HIV Screen 4th Generation wRfx: NONREACTIVE

## 2019-04-17 LAB — TROPONIN I (HIGH SENSITIVITY): Troponin I (High Sensitivity): 34 ng/L — ABNORMAL HIGH (ref <18)

## 2019-04-17 MED ORDER — REGADENOSON 0.4 MG/5ML IV SOLN
INTRAVENOUS | Status: AC
  Administered 2019-04-17: 0.4 mg via INTRAVENOUS
  Filled 2019-04-17: qty 5

## 2019-04-17 MED ORDER — TECHNETIUM TC 99M TETROFOSMIN IV KIT
10.0000 | PACK | Freq: Once | INTRAVENOUS | Status: AC | PRN
  Administered 2019-04-17: 14:00:00 10 via INTRAVENOUS

## 2019-04-17 MED ORDER — TECHNETIUM TC 99M TETROFOSMIN IV KIT
30.0000 | PACK | Freq: Once | INTRAVENOUS | Status: AC | PRN
  Administered 2019-04-17: 14:00:00 30 via INTRAVENOUS

## 2019-04-17 MED ORDER — ROSUVASTATIN CALCIUM 20 MG PO TABS
20.0000 mg | ORAL_TABLET | Freq: Every day | ORAL | Status: DC
  Administered 2019-04-17 – 2019-04-19 (×3): 20 mg via ORAL
  Filled 2019-04-17 (×3): qty 1

## 2019-04-17 MED ORDER — ORAL CARE MOUTH RINSE
15.0000 mL | Freq: Two times a day (BID) | OROMUCOSAL | Status: DC
  Administered 2019-04-17 – 2019-04-20 (×5): 15 mL via OROMUCOSAL

## 2019-04-17 MED ORDER — CARVEDILOL 12.5 MG PO TABS
12.5000 mg | ORAL_TABLET | Freq: Two times a day (BID) | ORAL | Status: DC
  Administered 2019-04-17 – 2019-04-18 (×2): 12.5 mg via ORAL
  Filled 2019-04-17 (×4): qty 1

## 2019-04-17 MED ORDER — REGADENOSON 0.4 MG/5ML IV SOLN
0.4000 mg | Freq: Once | INTRAVENOUS | Status: AC
  Filled 2019-04-17: qty 5

## 2019-04-17 MED ORDER — SODIUM CHLORIDE 0.9 % IV SOLN: INTRAVENOUS | Status: AC

## 2019-04-17 MED ORDER — HYDRALAZINE HCL 20 MG/ML IJ SOLN: 5.0000 mg | Freq: Four times a day (QID) | INTRAMUSCULAR | Status: DC | PRN

## 2019-04-17 NOTE — Progress Notes (Signed)
   Patient presented for a nuclear stress test today. No immediate complications. Stress imaging is pending at this time.   Preliminary EKG findings may be listed in the chart, but the stress test result will not be finalized until perfusion imaging is complete.  Darreld Mclean, PA-C 04/17/2019 2:26 PM

## 2019-04-17 NOTE — Progress Notes (Signed)
Was jittery pre Lexiscan. When Lexiscan complete became more jittery and had headache. Neurocheck per Virgie Dad PA. Glucometer was 151. Feels much better now. Slight headache and jitters are gone.

## 2019-04-17 NOTE — Consult Note (Signed)
Cardiology Consultation:   Patient ID: [name] MRN: [id number]; DOB: [birth year]  Admit date: 04/15/2019 Date of Consult: 04/17/2019  Primary Care Provider: [name], MD Primary Cardiologist: Dr. Curt Bears Primary Electrophysiologist:  None    Patient Profile:   [name] is a 69 y.o. female with a history of chest pain with low risk Myoview in 2017,  breast cancer, GERD, IBS, hypothyroidism, hyeprtension, and type 2 diabetes mellitus with neuropathy who is being seen today for the evaluation of chest pain at the request of Dr. Lonny Prude.  History of Present Illness:   [name] is s 69 year old female with the above history. Previously seen by Dr. Verl Blalock but more recently seen by Dr. Curt Bears for chest pain. Seen in 04/2015 for 2 types of chest pain, one that occurs most of the time and one with exertion that is relieved with rest. Lexiscan Myoview was ordered and came back low risk showing probable breast attenuation but no ischemia. Patient last seen by Dr. Curt Bears in 04/2017 at which time she reported continue chest pain at rest and with activity that she described as a burning/squeezing pain.  The patient presented to the ED 04/15/19 for intermittent chest pain over the last 2 months. The day before the patient had an episode at rest. It is substernal and radiating to the back. It is aching in nature.Tums do not relieve the pain. She also reported wheezing and dyspnea on exertion. She also reported swelling in her legs. Patient called her PCP and was told to go to the ER for evaluation. Plan was to try TUMS and then consider coronary CT if no relief. Patient has not been seen by Cardiology since that time.  Patient presented to the ED on 04/15/2019 for chest pain, shortness of breath, and cough. Similar to chest pain patient has had in the past but he reports it has worsened over the last month. She describes pain as a tightness/heaviness that is worse with  activity and improves with rest. She states pain is becoming more frequent and lasting for longer. She has associated shortness of breath, wheezing, and occasional nausea with this but no diaphoresis, palpitations, syncope or vomiting. She also notes some right neck pain sometimes associated with the pain but she has trouble elaborating on this. She has stable 3 pillow orthopnea but no PND. She does note worsening lower extremity edema over the last 1 month. She has PRN Lasix at home and states she has been needing it daily over the last 1-2 months with no significant improvement in edema. Also has lower extremity pain which has been bothering her for a while. No abnormal bleeding.  In the ED, vital stable while at rest but patient became hypoxic with O2 of 85% with ambulation. EKG showed normal sinus rhythm, rate 72 bpm, with underlying artifact and non-specific ST/T changes. High-sensitivity troponin minimally elevated and relatively flat at 19 >> 21 >> 33 >> 34. Chest x-ray showed  Prominence of central pulmonary vascularly possibly secondary to pulmonary arterial hypertension but no edema. Chest CTA showed marked dilatation of the main pulmonary artery suggestive of pulmonary arterial hypertension, 4cm ascending thoracic aortic aneurysm, but no evidence of PE. WBC 10.2, Hgb 11.7, Plts 271. Na 140, K 4.6, Glucose 153, Cr 1.27. COVID-19 testing negative. Patient was admitted for further evaluation and Cardiology was consulted.  At the time of this evaluation, patient sitting comfortably in chair. She reports recurrent chest tightness that she currently ranks as a  7/10 on the pain scale since moving from bed to chair today. Lower extremities also tender to palpation on exam.  Heart Pathway Score:     Past Medical History:  Diagnosis Date  . Anginal pain ([place]) 03/19/2012   saw Dr. Cathie Olden .. she thinks its more related to stomach issues  . Arthritis    "all over; mostly in my back" (03/20/2012)  . Breast  cancer ([place])    "right" (03/20/2012)  . Cardiomyopathy   . Chest pain 06/2005   Hospitalized, dystolic dysfunction  . Chronic lower back pain    "have 2 herniated discs; going to have to have a fusion" (03/20/2012)  . Family history of anesthesia complication    "father has nausea and vomiting"  . Gallstones   . GERD (gastroesophageal reflux disease)   . H/O hiatal hernia   . History of kidney stones   . History of right mastectomy   . Hypertension    "patient states never had HTN, takes Hyzaar for heart  . Hypothyroidism   . Irritable bowel syndrome   . Migraines    "it's been a long time since I've had one" (03/20/2012)  . Osteoarthritis   . Peripheral neuropathy   . PONV (postoperative nausea and vomiting)   . Sciatica   . Type II diabetes mellitus ([place])     Past Surgical History:  Procedure Laterality Date  . ABDOMINAL HYSTERECTOMY  1993  . adenosin cardiolite  07/2005   (+) wall motion abnormality  . AXILLARY LYMPH NODE DISSECTION  2003   squamous cell cancer  . BACK SURGERY    . CARDIAC CATHETERIZATION  ? 2005 / 2007  . CHOLECYSTECTOMY N/A 01/24/2018   Procedure: LAPAROSCOPIC CHOLECYSTECTOMY;  Surgeon: Erroll Luna, MD;  Location: [facility];  Service: General;  Laterality: N/A;  . CT abdomen and pelvis  02/2010   Same  . ESOPHAGOGASTRODUODENOSCOPY  2005   GERD  . KNEE ARTHROSCOPY  1990's   "right" (03/20/2012)  . LUMBAR FUSION N/A 08/22/2014   Procedure: Right L2-3 and right L1-2 transforaminal lumbar interbody fusion with pedicle screws, rods, sleeves, cages, local bone graft, Vivigen, cancellous chips;  Surgeon: Jessy Oto, MD;  Location: [facility];  Service: Orthopedics;  Laterality: N/A;  . LUMBAR LAMINECTOMY/DECOMPRESSION MICRODISCECTOMY N/A 02/04/2014   Procedure: RIGHT L2-3 MICRODISCECTOMY ;  Surgeon: Jessy Oto, MD;  Location: [facility];  Service: Orthopedics;  Laterality: N/A;  . MASTECTOMY Right   . MASTECTOMY WITH AXILLARY LYMPH NODE DISSECTION  12/2003     "right" (03/20/2012)  . POSTERIOR CERVICAL FUSION/FORAMINOTOMY  2001  . SHOULDER ARTHROSCOPY W/ ROTATOR CUFF REPAIR  2003   Left  . THYROIDECTOMY  1977   Right  . TOTAL KNEE ARTHROPLASTY  2000   Right  . [place] of abdomen  07/2005   Fatty liver / kidney stones     Home Medications:  Prior to Admission medications   Medication Sig Start Date End Date Taking? Authorizing Provider  acetaminophen (TYLENOL) 500 MG tablet Take 1,000 mg by mouth every 6 (six) hours as needed for moderate pain or headache.    [provider]  amLODipine (NORVASC) 10 MG tablet TAKE 1 TABLET BY MOUTH EVERY DAY 03/29/19   Susy Frizzle, MD  aspirin 81 MG tablet Take 81 mg by mouth daily.    [provider]  Blood Glucose Calibration (TRUE METRIX LEVEL 2) Normal SOLN Use as directed 05/17/17   Susy Frizzle, MD  Blood Glucose Monitoring Suppl (  TRUE METRIX AIR GLUCOSE METER) w/Device KIT 1 kit by Does not apply route 3 (three) times daily. 05/10/17   Susy Frizzle, MD  clindamycin (CLEOCIN) 150 MG capsule Take 300 mg by mouth 4 (four) times daily.    [provider]  fluticasone (FLONASE) 50 MCG/ACT nasal spray Place 2 sprays into both nostrils daily. Patient taking differently: Place 2 sprays into both nostrils daily as needed for allergies.  05/10/17   Susy Frizzle, MD  furosemide (LASIX) 40 MG tablet TAKE 1 TABLET (40 MG TOTAL) BY MOUTH DAILY AS NEEDED (LEG SWELLING). 01/09/19   Susy Frizzle, MD  glucose blood (TRUE METRIX BLOOD GLUCOSE TEST) test strip Use as instructed 05/10/17   Susy Frizzle, MD  insulin NPH-regular Human (70-30) 100 UNIT/ML injection Inject 60-70 Units into the skin 2 (two) times daily before a meal.    [provider]  levothyroxine (SYNTHROID) 100 MCG tablet Take 1 tablet (100 mcg total) by mouth daily. 10/18/18   Susy Frizzle, MD  losartan (COZAAR) 50 MG tablet TAKE 1 TABLET BY MOUTH EVERY DAY 03/29/19   Susy Frizzle, MD   metFORMIN (GLUCOPHAGE) 850 MG tablet Take 1 tablet (850 mg total) by mouth 2 (two) times daily with a meal. 02/28/18   Nida, Marella Chimes, MD  metoCLOPramide (REGLAN) 10 MG tablet Take 1 tablet (10 mg total) by mouth 4 (four) times daily -  before meals and at bedtime. 10/18/18   Susy Frizzle, MD  pantoprazole (PROTONIX) 40 MG tablet TAKE 1 TABLET BY MOUTH EVERY DAY 02/25/19   [name], MD  polyethylene glycol (MIRALAX / GLYCOLAX) packet Take 17 g by mouth daily as needed for moderate constipation.    [provider]  pregabalin (LYRICA) 75 MG capsule TAKE 1 CAPSULE BY MOUTH TWICE A DAY 02/12/19   [name], DPM  tiZANidine (ZANAFLEX) 4 MG tablet Take 1 tablet (4 mg total) by mouth every 6 (six) hours as needed for muscle spasms. 10/18/18   Susy Frizzle, MD  TRUEPLUS LANCETS 33G MISC Use as directed 05/10/17   Susy Frizzle, MD    Inpatient Medications: Scheduled Meds: . amLODipine  10 mg Oral Daily  . aspirin EC  81 mg Oral Daily  . heparin  5,000 Units Subcutaneous Q8H  . insulin aspart  0-9 Units Subcutaneous Q4H  . levothyroxine  100 mcg Oral Daily  . losartan  50 mg Oral Daily  . mouth rinse  15 mL Mouth Rinse BID  . metoCLOPramide  10 mg Oral TID AC & HS  . pantoprazole  40 mg Oral Daily  . pregabalin  75 mg Oral BID  . rosuvastatin  20 mg Oral q1800  . sodium chloride flush  3 mL Intravenous Once   Continuous Infusions: . sodium chloride 50 mL/hr at 04/17/19 0300   PRN Meds: albuterol, furosemide, morphine injection, nitroGLYCERIN  Allergies:    Allergies  Allergen Reactions  . Dilaudid [Hydromorphone Hcl] Nausea And Vomiting  . Lipitor [Atorvastatin] Other (See Comments)    Body & Muscle Aches  . Adhesive [Tape] Rash  . Ampicillin Rash and Other (See Comments)    REACTION: Rash and welts on her hands when she took it in the hospital  . Latex Itching, Dermatitis, Rash and Other (See Comments)    REACTION: Patient had a reaction  to tape after surgery and they told her she was allergic to Latex 03/20/2012 "if it's on too long it  will break me out"  . Penicillins Rash and Other (See Comments)    Has patient had a PCN reaction causing immediate rash, facial/tongue/throat swelling, SOB or lightheadedness with hypotension: No Has patient had a PCN reaction causing severe rash involving mucus membranes or skin necrosis: No Has patient had a PCN reaction that required hospitalization: Yes - in hospital receiving treatment Has patient had a PCN reaction occurring within the last 10 years: No If all of the above answers are "NO", then may proceed with Cephalosporin use.     Social History:   Social History   Socioeconomic History  . Marital status: Married    Spouse name: Not on file  . Number of children: 1  . Years of education: Not on file  . Highest education level: Not on file  Occupational History  . Occupation: Retired since 2003  Tobacco Use  . Smoking status: Never Smoker  . Smokeless tobacco: Never Used  Substance and Sexual Activity  . Alcohol use: No  . Drug use: No  . Sexual activity: Not on file  Other Topics Concern  . Not on file  Social History Narrative  . Not on file   Social Determinants of Health   Financial Resource Strain:   . Difficulty of Paying Living Expenses: Not on file  Food Insecurity:   . Worried About Charity fundraiser in the Last Year: Not on file  . Ran Out of Food in the Last Year: Not on file  Transportation Needs:   . Lack of Transportation (Medical): Not on file  . Lack of Transportation (Non-Medical): Not on file  Physical Activity:   . Days of Exercise per Week: Not on file  . Minutes of Exercise per Session: Not on file  Stress:   . Feeling of Stress : Not on file  Social Connections:   . Frequency of Communication with Friends and Family: Not on file  . Frequency of Social Gatherings with Friends and Family: Not on file  . Attends Religious Services: Not  on file  . Active Member of Clubs or Organizations: Not on file  . Attends Archivist Meetings: Not on file  . Marital Status: Not on file  Intimate Partner Violence:   . Fear of Current or Ex-Partner: Not on file  . Emotionally Abused: Not on file  . Physically Abused: Not on file  . Sexually Abused: Not on file    Family History:   Family History  Problem Relation Age of Onset  . Hypertension Mother   . Diabetes Father   . Hypertension Father   . Hypertension Sister   . Hypertension Brother   . Hypertension Brother   . Diabetes Brother   . Diabetes Brother   . Diabetes Other        Family Hx. of Diabetes  . Hypertension Other        Family History of HTN  . Deep vein thrombosis Daughter   . Breast cancer Maternal Aunt      ROS:  Please see the history of present illness.  Review of Systems  Constitutional: Negative for fever.  Respiratory: Positive for cough, shortness of breath and wheezing.   Cardiovascular: Positive for chest pain, orthopnea and leg swelling. Negative for palpitations and PND.  Gastrointestinal: Positive for nausea. Negative for blood in stool, melena and vomiting.  Genitourinary: Negative for hematuria.  Musculoskeletal: Positive for back pain (chronic) and neck pain. Negative for falls.  Neurological: Negative for loss  of consciousness.  Endo/Heme/Allergies: Does not bruise/bleed easily.  Psychiatric/Behavioral: Negative for substance abuse.      Physical Exam/Data:   Vitals:   04/16/19 2132 04/17/19 0000 04/17/19 0447 04/17/19 0638  BP: (!) 139/113 (!) 159/68 (!) 156/65   Pulse: 78 72 65   Resp: (!) _0 Temp: 98.3 F (36.8 C) 98.8 F (37.1 C) 98.8 F (37.1 C)   TempSrc: Oral Oral Oral   SpO2: 91% 94% 97%   Weight:    99 kg  Height:        Intake/Output Summary (Last 24 hours) at 04/17/2019 1141 Last data filed at 04/17/2019 0700 Gross per 24 hour  Intake 529.87 ml  Output --  Net 529.87 ml   Last 3 Weights  04/17/2019 04/15/2019 12/27/2018  Weight (lbs) 218 lb 4.8 oz 229 lb 218 lb 8 oz  Weight (kg) 99.02 kg 103.874 kg 99.111 kg     Body mass index is 36.33 kg/m.  General: 69 y.o. obese African-American female resting comfortably in no acute distress. HEENT: Normocephalic and atraumatic. Neck: Supple. No carotid bruits. No JVD. Heart: RRR. Distinct S1 and S2. No murmurs, gallops, or rubs. Radial pulses 2+ and equal bilaterally. Lungs: No increased work of breathing. Clear to ausculation bilaterally. No wheezes, rhonchi, or rales.  Abdomen: Soft, non-distended, and non-tender to palpation. Bowel sounds present. MSK: Normal strength and tone for age. Extremities: 1+ pitting edema of bilateral lower extremities. Skin: Warm and dry. Neuro: Alert and oriented x3. No focal deficits. Psych: Normal affect. Responds appropriately.  EKG:  The EKG was personally reviewed and demonstrates:  Normal sinus rhythm, rate 72 bpm, with underlying artifact and non-specific ST changes. Normal axis. PR and QRS interval normal. QTc 427 ms.  Telemetry:  Telemetry was personally reviewed and demonstrates:  Normal sinus rhythm with 1st degree AV block and PVC with rates in the 50's to 80's.   Relevant CV Studies:  Lexiscan Myoview 10/2015:  Nuclear stress EF: 57%.  There was no ST segment deviation noted during stress.  Defect 1: There is a medium defect of moderate severity present in the mid anterolateral and apex location.  Defect 2: There is a medium defect of moderate severity present in the basal inferolateral location.  This is a low risk study.  The left ventricular ejection fraction is normal (55-65%).   Low risk stress nuclear study with probable breast attenuation and thinning of the basal inferior lateral wall; no ischemia; EF 57 with normal wall motion. _______________  Echocardiogram 02/09/2018: Study Conclusions: - Left ventricle: The cavity size was normal. There was mild focal   basal  hypertrophy of the septum. Systolic function was normal.   The estimated ejection fraction was in the range of 55% to 60%.   Wall motion was normal; there were no regional wall motion   abnormalities. Left ventricular diastolic function parameters   were normal. - Aortic valve: There was trivial regurgitation.  Laboratory Data:  High Sensitivity Troponin:   Recent Labs  Lab 04/15/19 1415 04/15/19 1920 04/16/19 2125 04/17/19 0030  TROPONINIHS 19* 21* 33* 34*     Chemistry Recent Labs  Lab 04/15/19 1415 04/16/19 2106 04/17/19 0227  NA 140  --  140  K 4.6  --  4.1  CL 108  --  108  CO2 22  --  24  GLUCOSE 153*  --  193*  BUN 28*  --  21  CREATININE 1.27* 1.75* 1.90*  CALCIUM 10.0  --  9.8  GFRNONAA 43* 29* 26*  GFRAA 50* 34* 30*  ANIONGAP 10  --  8    Recent Labs  Lab 04/17/19 0227  PROT 6.3*  ALBUMIN 2.9*  AST 23  ALT 18  ALKPHOS 50  BILITOT 0.5   Hematology Recent Labs  Lab 04/15/19 1415 04/16/19 2106 04/17/19 0227  WBC 10.2 8.4 7.5  RBC 3.96 4.09 3.58*  HGB 11.7* 11.8* 10.3*  HCT 36.9 37.7 32.9*  MCV 93.2 92.2 91.9  MCH 29.5 28.9 28.8  MCHC 31.7 31.3 31.3  RDW 14.5 14.4 14.4  PLT 271 282 238   BNPNo results for input(s): BNP, PROBNP in the last 168 hours.  DDimer No results for input(s): DDIMER in the last 168 hours.   Radiology/Studies:  CT Angio Chest PE W and/or Wo Contrast  Result Date: 04/15/2019 CLINICAL DATA:  Shortness of breath. EXAM: CT ANGIOGRAPHY CHEST WITH CONTRAST TECHNIQUE: Multidetector CT imaging of the chest was performed using the standard protocol during bolus administration of intravenous contrast. Multiplanar CT image reconstructions and MIPs were obtained to evaluate the vascular anatomy. CONTRAST:  118m OMNIPAQUE IOHEXOL 350 MG/ML SOLN COMPARISON:  March 05, 2017. FINDINGS: Cardiovascular: Evaluation for pulmonary emboli is somewhat limited by respiratory motion artifact.Given this limitation, no PE was identified. The  main pulmonary artery is significantly dilated measuring approximately 4.2 cm in diameter. There is no CT evidence of acute right heart strain. There is an ascending thoracic aortic aneurysm measuring approximately 4 cm. Atherosclerotic changes are noted of the thoracic aorta. Heart size is enlarged. There is no significant pericardial effusion. Mediastinum/Nodes: --No mediastinal or hilar lymphadenopathy. --No axillary lymphadenopathy. --No supraclavicular lymphadenopathy. --patient is status post right-sided hemithyroidectomy. --The esophagus is unremarkable Lungs/Pleura: There is a mosaic appearance of the lungs bilaterally. There is no pneumothorax. There is atelectasis at the lung bases with a trace left-sided pleural effusion. Upper Abdomen: There is likely underlying hepatic steatosis. The patient is status post prior cholecystectomy. Musculoskeletal: No chest wall abnormality. No acute or significant osseous findings. Patient is status post right-sided mastectomy. Review of the MIP images confirms the above findings. IMPRESSION: 1. Evaluation for pulmonary emboli is somewhat limited by respiratory motion artifact. Given this limitation, no PE was identified. 2. Marked dilatation of the main pulmonary artery, suggestive of pulmonary arterial hypertension. 3. Cardiomegaly. 4. Ascending thoracic aortic aneurysm measuring approximately 4 cm. Recommend annual imaging followup by CTA or MRA. This recommendation follows 2010 ACCF/AHA/AATS/ACR/ASA/SCA/SCAI/SIR/STS/SVM Guidelines for the Diagnosis and Management of Patients with Thoracic Aortic Disease. Circulation. 2010; 121:: O832-P498 Aortic aneurysm NOS (ICD10-I71.9) 5. Mosaic appearance of the lungs bilaterally, which can be seen with small vessel or small airway disease. Pulmonary hypertension or chronic thromboembolic disease can have a similar appearance. 6. Trace left-sided pleural effusion. Aortic Atherosclerosis (ICD10-I70.0). Electronically Signed   By:  CConstance HolsterM.D.   On: 04/15/2019 21:26   DG Chest Portable 1 View  Result Date: 04/15/2019 CLINICAL DATA:  Shortness of breath, cough EXAM: PORTABLE CHEST 1 VIEW COMPARISON:  12/25/2017 FINDINGS: Cardiomediastinal contours are stable with enlargement of central pulmonary vasculature. Lungs are clear. No signs of pleural effusion. Visualized skeletal structures are unremarkable aside from cervical spinal fusion seen at the upper margin of the film. IMPRESSION: Prominence of central pulmonary vasculature may be secondary to pulmonary arterial hypertension, no consolidation or pleural effusion. Electronically Signed   By: GZetta BillsM.D.   On: 04/15/2019 18:59   ECHOCARDIOGRAM COMPLETE  Result Date: 04/17/2019   ECHOCARDIOGRAM REPORT  Patient Name:   SANDRA BRENTS Date of Exam: 04/17/2019 Medical Rec #:  [id number]               Height:       65.0 in Accession #:    [id number]              Weight:       218.3 lb Date of Birth:  01-Jul-1950               BSA:          2.05 m Patient Age:    22 years                BP:           156/65 mmHg Patient Gender: F                       HR:           61 bpm. Exam Location:  Inpatient Procedure: 2D Echo and Strain Analysis Indications:    Chest Pain 786.50 / R07.9  History:        Patient has prior history of Echocardiogram examinations, most                 recent 02/09/2018. Risk Factors:Hypertension and Diabetes. Marked                 dilatation of the main pulmonary artery seen on CT.                 Past history of breast cancer. Hypothyroidsm.  Sonographer:    Darlina Sicilian RDCS Referring Phys: [place]  1. Left ventricular ejection fraction, by visual estimation, is 55 to 60%. The left ventricle has normal function. There is severely increased left ventricular hypertrophy.  2. Asymmetric hypertorphy measuring 26m in basal septum (977min posterior wall)  3. Left ventricular diastolic parameters are indeterminate.  4. The mitral  valve is normal in structure. Mild mitral valve regurgitation.  5. The aortic valve is tricuspid. Aortic valve regurgitation is trivial. No evidence of aortic valve sclerosis or stenosis.  6. The tricuspid valve is normal in structure.  7. The pulmonic valve was not well visualized. Pulmonic valve regurgitation is trivial.  8. There is mild dilatation of the ascending aorta measuring 38 mm.  9. Global right ventricle has normal systolic function.The right ventricular size is mildly enlarged. 10. Right atrial size was normal. 11. Left atrial size was normal. 12. Dilated main pulmonary artery 13. Appears to be normal pulmonary artery systolic pressure based on TR jet, though IVC is poorly visualized so unable to estimate RA pressure and PASP FINDINGS  Left Ventricle: Left ventricular ejection fraction, by visual estimation, is 55 to 60%. The left ventricle has normal function. The left ventricle has no regional wall motion abnormalities. The left ventricular internal cavity size was the left ventricle is normal in size. There is severely increased left ventricular hypertrophy. Asymmetric left ventricular hypertrophy. Left ventricular diastolic parameters are indeterminate. Right Ventricle: The right ventricular size is mildly enlarged. No increase in right ventricular wall thickness. Global RV systolic function is has normal systolic function. The tricuspid regurgitant velocity is 2.13 m/s, and with an assumed right atrial  pressure of 3 mmHg, the estimated right ventricular systolic pressure is normal at 21.1 mmHg. Left Atrium: Left atrial size was normal in size. Right Atrium: Right atrial size was normal in size Pericardium:  There is no evidence of pericardial effusion. Mitral Valve: The mitral valve is normal in structure. Mild mitral valve regurgitation. Tricuspid Valve: The tricuspid valve is normal in structure. Tricuspid valve regurgitation is trivial. Aortic Valve: The aortic valve is tricuspid. Aortic valve  regurgitation is trivial. The aortic valve is structurally normal, with no evidence of sclerosis or stenosis. Pulmonic Valve: The pulmonic valve was not well visualized. Pulmonic valve regurgitation is trivial. Pulmonic regurgitation is trivial. Aorta: Aortic dilatation noted. There is mild dilatation of the ascending aorta measuring 38 mm. Pulmonary Artery: The pulmonary artery is Dilated. IAS/Shunts: The interatrial septum was not well visualized.  LEFT VENTRICLE PLAX 2D LVIDd:         4.90 cm       Diastology LVIDs:         3.40 cm       LV e' lateral:   6.64 cm/s LV PW:         0.90 cm       LV E/e' lateral: 15.0 LV IVS:        1.60 cm       LV e' medial:    6.09 cm/s LVOT diam:     1.80 cm       LV E/e' medial:  16.3 LV SV:         65 ml LV SV Index:   30.06 LVOT Area:     2.54 cm  LV Volumes (MOD) LV area d, A2C:    32.70 cm LV area d, A4C:    32.90 cm LV area s, A2C:    20.00 cm LV area s, A4C:    21.10 cm LV major d, A2C:   8.22 cm LV major d, A4C:   8.15 cm LV major s, A2C:   7.30 cm LV major s, A4C:   7.25 cm LV vol d, MOD A2C: 106.0 ml LV vol d, MOD A4C: 110.0 ml LV vol s, MOD A2C: 46.8 ml LV vol s, MOD A4C: 51.3 ml LV SV MOD A2C:     59.2 ml LV SV MOD A4C:     110.0 ml LV SV MOD BP:      58.9 ml RIGHT VENTRICLE RV S prime:     15.30 cm/s TAPSE (M-mode): 2.2 cm LEFT ATRIUM             Index       RIGHT ATRIUM           Index LA diam:        4.20 cm 2.05 cm/m  RA Area:     16.70 cm LA Vol (A2C):   41.5 ml 20.21 ml/m RA Volume:   46.40 ml  22.60 ml/m LA Vol (A4C):   66.2 ml 32.24 ml/m LA Biplane Vol: 55.4 ml 26.98 ml/m  AORTIC VALVE LVOT Vmax:   92.40 cm/s LVOT Vmean:  66.200 cm/s LVOT VTI:    0.196 m  AORTA Ao Root diam: 3.10 cm Ao Asc diam:  3.80 cm MITRAL VALVE                        TRICUSPID VALVE MV Area (PHT): 5.27 cm             TR Peak grad:   18.1 mmHg MV PHT:        41.76 msec           TR Vmax:        213.00 cm/s  MV Decel Time: 144 msec MV E velocity: 99.40 cm/s 103 cm/s  SHUNTS MV A  velocity: 93.10 cm/s 70.3 cm/s Systemic VTI:  0.20 m MV E/A ratio:  1.07       1.5       Systemic Diam: 1.80 cm  Oswaldo Milian MD Electronically signed by Oswaldo Milian MD Signature Date/Time: 04/17/2019/10:37:05 AM    Final        TIMI Risk Score for Unstable Angina or Non-ST Elevation MI:   The patient's TIMI risk score is 5, which indicates a 26% risk of all cause mortality, new or recurrent myocardial infarction or need for urgent revascularization in the next 14 days.   Assessment and Plan:   Chest Pain - Patient presents with chest pain that is worse with exertion and improves with rest. Similar to chest pain she has had in the past but now more frequent and lasting longer. Low risk Myoview in 2017. - No acute ST/T changes on EKG. - High-sensitivity troponin minimally elevated and relatively flat at 19  >> 21 >> 33 >> 34. Not consistent with ACS. - Echo shows LVEF of 55-60% with severe LVH and asymmetric hypertrophy in the basal septum. RV mildly enlarged and main pulmonary artery dilated. - Currently reporting 7/10 chest pain. - Cardiovascular risk factors include HTN, HLD, T2DM, obesity. However, no significant coronary calcifications on chest CTA. Discussed with MD - will proceed with [facility] today given creatinine of 1.90.   Hypertension - BP 156/65 this morning before any medications. - Continue Amlodipine 31m daily. - Hold home Losartan due to AKI.  Dyslipidemia - Lipid panel this admission: Total Cholesterol 193, Triglycerides 208, HDL 41, LDL 110. - Intolerant to Lipitor in the past due to significant myalgias. - Could try different statin or Zetia. Also could consider referral to lipid clinic for possible PCSK9 inhibitor.  Type 2 Diabetes Mellitus - Uncontrolled - Hemoglobin A1c 10.3 in 02/2019. - Management per primary team.   Hypothyroidism - Continue Synthroid per primary team.  AKI - Creatinine 1.90 today up from 1.27 on admission after  chest CTA. - Currently being gently hydrated at 50 mL/hr. - Hold home Losartan.  Suspected Sleep Apnea - Patient reports snoring and not feeling well rested in the morning. - BMI 36.33. - Will plan for outpatient sleep study.  Lower Extremity Edema /Pain  - Mild lower extremity edema.She also reports pain in lower extremities (right greater than left) for a while now. - Lower extremity dopplers in 12/2018 showed no DVT. - Discussed limiting salt and using compression stockings.    For questions or updates, please contact CLodge PolePlease consult [email or web address] for contact info under     Signed, CDarreld Mclean PA-C  04/17/2019 11:41 AM

## 2019-04-17 NOTE — Progress Notes (Signed)
[facility]  PROGRESS NOTE  Tanicia Wolaver YPP:[id number] DOB: [birth year] DOA: 04/15/2019 PCP: [name], MD Admit date - 04/15/2019   Admitting Physician Jani Gravel, MD  Outpatient Primary MD for the patient is [name], MD  LOS - 0 Brief Narrative   Iridian Reader Lahm is a 69 y.o. year old female with medical history significant for HTN, type 2 diabetes, CKD stage III, hypothyroidism who presented on 04/15/2019 with acute on chronic chest pain.  Chest pain has been ongoing for at least the past month worsened with exertion, located in substernal chest area, improved with rest.  Prior to admission patient noted associated dyspnea and increased fatigue as well as wheezing.     ED evaluation EKG was nonischemic, initial troponin minimally elevated and trending remained flat.  CTA chest obtained due to dyspnea was negative for PE (some limitation due to respiratory motion artifact) but showed some concern for pulmonary hypertension, cardiomegaly,  as well as incidental finding of 4 cm ascending aortic aneurysm with no signs of dissection given her risk factors elevated troponin cardiology was consulted and patient underwent Lexiscan Myoview   Subjective  Still having chest pain while sitting in bedside chair 7/10 All across chest   A & P    Chest pain, has elements of typical and atypical cardiac features Myoview, low risk with no reversible ischemia Chest pain seems to be somewhat reproducible on my exam -Continue muscle relaxers as needed -Likely no further ischemic evaluation, will defer to cardiology , GERD, HLD hypertension, not at goal BP elevated to 200s during/after stress test, improved somewhat to 171/79 -Need close monitoring with nursing given significant elevation after procedure especially in setting of known aortic aneurysm -IV hydralazine as needed SBP greater than 170s -Coreg 12.5 mg twice daily added -Probably further worsened by  holding her losartan given questionable renal function -Continue amlodipine  CKD, stage III, worsening Baseline creatinine 1.2-1.3 Currently 1.9, could be worsening in setting of contrast given from recent CT this admission -Repeat BMP in a.m., avoid nephrotoxins  Type 2 diabetes with peripheral neuropathy, well controlled Fasting sugar at goal on sliding scale along here  Metformin -Continue Lyrica -Hold Metformin, continue to monitor CBG, sliding scale as needed  Wheezing, seems resolved No pulmonary abnormality seen on CT chest imaging -Monitor  4 cm ascending aorta aneurysm Incidental finding on CTA during admission No signs of dissection -Follow-up with outpatient  Normocytic anemia Previous baseline seems to be within normal limits Hemoglobin currently stable from 1011, no active signs of bleeding -Repeat CBC in a.m.  Probable OSA -Outpatient sleep study arranged by cardiology  Chronic, stable medical problems Hypothyroidism- home Synthroid GERD-Protonix HLD-Crestor  Family Communication  : No family at bedside  Code Status : Cardiology  Disposition Plan  : Change from observation to inpatient given required close monitoring of blood pressure after stress test with BP elevated to 200s--added coreg, close monitoring of BP in setting of AAA, to ensure no need for IV intervention,  monitor current regimen, monitor kidney function,   Consults  : Cardiology  Procedures  : Lexiscan, 1/6  DVT Prophylaxis  : Heparin  Lab Results  Component Value Date   PLT 238 04/17/2019    Diet :  Diet Order            Diet heart healthy/carb modified Room service appropriate? Yes; Fluid consistency: Thin  Diet effective now  Inpatient Medications Scheduled Meds: . amLODipine  10 mg Oral Daily  . aspirin EC  81 mg Oral Daily  . heparin  5,000 Units Subcutaneous Q8H  . insulin aspart  0-9 Units Subcutaneous Q4H  . levothyroxine  100 mcg Oral Daily  .  losartan  50 mg Oral Daily  . mouth rinse  15 mL Mouth Rinse BID  . metoCLOPramide  10 mg Oral TID AC & HS  . pantoprazole  40 mg Oral Daily  . pregabalin  75 mg Oral BID  . rosuvastatin  20 mg Oral q1800  . sodium chloride flush  3 mL Intravenous Once   Continuous Infusions: PRN Meds:.albuterol, furosemide, morphine injection, nitroGLYCERIN  Antibiotics  :   Anti-infectives (From admission, onward)   None        Objective   Vitals:   04/16/19 2132 04/17/19 0000 04/17/19 0447 04/17/19 0638  BP: (!) 139/113 (!) 159/68 (!) 156/65   Pulse: 78 72 65   Resp: (!) _0 Temp: 98.3 F (36.8 C) 98.8 F (37.1 C) 98.8 F (37.1 C)   TempSrc: Oral Oral Oral   SpO2: 91% 94% 97%   Weight:    99 kg  Height:        SpO2: 97 %  Wt Readings from Last 3 Encounters:  04/17/19 99 kg  12/27/18 99.1 kg  12/12/18 97.5 kg     Intake/Output Summary (Last 24 hours) at 04/17/2019 1207 Last data filed at 04/17/2019 0700 Gross per 24 hour  Intake 529.87 ml  Output --  Net 529.87 ml    Physical Exam:  Awake Alert, Oriented X 3, Normal affect, sitting in bedside chair No new F.N deficits,  [place].AT, No JVD Symmetrical Chest wall movement on room air, Good air movement bilaterally, CTAB RRR,No Gallops,Rubs or new Murmurs,  Chest wall tenderness with no erythema noted +ve B.Sounds, Abd Soft, No tenderness,  No rebound, guarding or rigidity.    I have personally reviewed the following:   Data Reviewed:  CBC Recent Labs  Lab 04/15/19 1415 04/16/19 2106 04/17/19 0227  WBC 10.2 8.4 7.5  HGB 11.7* 11.8* 10.3*  HCT 36.9 37.7 32.9*  PLT 271 282 238  MCV 93.2 92.2 91.9  MCH 29.5 28.9 28.8  MCHC 31.7 31.3 31.3  RDW 14.5 14.4 14.4    Chemistries  Recent Labs  Lab 04/15/19 1415 04/16/19 2106 04/17/19 0227  NA 140  --  140  K 4.6  --  4.1  CL 108  --  108  CO2 22  --  24  GLUCOSE 153*  --  193*  BUN 28*  --  21  CREATININE 1.27* 1.75* 1.90*  CALCIUM 10.0  --  9.8   AST  --   --  23  ALT  --   --  18  ALKPHOS  --   --  50  BILITOT  --   --  0.5   ------------------------------------------------------------------------------------------------------------------ Recent Labs    04/17/19 0227  CHOL 193  HDL 41  LDLCALC 110*  TRIG 208*  CHOLHDL 4.7    Lab Results  Component Value Date   HGBA1C 10.3 (H) 03/01/2019   ------------------------------------------------------------------------------------------------------------------ No results for input(s): TSH, T4TOTAL, T3FREE, THYROIDAB in the last 72 hours.  Invalid input(s): FREET3 ------------------------------------------------------------------------------------------------------------------ No results for input(s): VITAMINB12, FOLATE, FERRITIN, TIBC, IRON, RETICCTPCT in the last 72 hours.  Coagulation profile No results for input(s): INR, PROTIME in the last 168 hours.  No results for  input(s): DDIMER in the last 72 hours.  Cardiac Enzymes No results for input(s): CKMB, TROPONINI, MYOGLOBIN in the last 168 hours.  Invalid input(s): CK ------------------------------------------------------------------------------------------------------------------    Component Value Date/Time   BNP 42.9 03/04/2017 2005   BNP 43.9 10/03/2016 1419    Micro Results Recent Results (from the past 240 hour(s))  SARS Coronavirus 2 Ag (30 min TAT) - Nasal Swab (BD Veritor Kit)     Status: None   Collection Time: 04/15/19  3:10 PM   Specimen: Nasal Swab (BD Veritor Kit)  Result Value Ref Range Status   SARS Coronavirus 2 Ag NEGATIVE NEGATIVE Final    Comment: (NOTE) SARS-CoV-2 antigen NOT DETECTED.  Negative results are presumptive.  Negative results do not preclude SARS-CoV-2 infection and should not be used as the sole basis for treatment or other patient management decisions, including infection  control decisions, particularly in the presence of clinical signs and  symptoms consistent with  COVID-19, or in those who have been in contact with the virus.  Negative results must be combined with clinical observations, patient history, and epidemiological information. The expected result is Negative. Fact Sheet for Patients: PodPark.tn Fact Sheet for Healthcare Providers: GiftContent.is This test is not yet approved or cleared by the [place] FDA and  has been authorized for detection and/or diagnosis of SARS-CoV-2 by FDA under an Emergency Use Authorization (EUA).  This EUA will remain in effect (meaning this test can be used) for the duration of  the COVID-19 de claration under Section 564(b)(1) of the Act, 21 U.S.C. section 360bbb-3(b)(1), unless the authorization is terminated or revoked sooner. Performed at [facility], [address] [place], [place] [address]   Respiratory Panel by RT PCR (Flu A&B, Covid) - Nasopharyngeal Swab     Status: None   Collection Time: 04/15/19  7:45 PM   Specimen: Nasopharyngeal Swab  Result Value Ref Range Status   SARS Coronavirus 2 by RT PCR NEGATIVE NEGATIVE Final    Comment: (NOTE) SARS-CoV-2 target nucleic acids are NOT DETECTED. The SARS-CoV-2 RNA is generally detectable in upper respiratoy specimens during the acute phase of infection. The lowest concentration of SARS-CoV-2 viral copies this assay can detect is 131 copies/mL. A negative result does not preclude SARS-Cov-2 infection and should not be used as the sole basis for treatment or other patient management decisions. A negative result may occur with  improper specimen collection/handling, submission of specimen other than nasopharyngeal swab, presence of viral mutation(s) within the areas targeted by this assay, and inadequate number of viral copies (<131 copies/mL). A negative result must be combined with clinical observations, patient history, and epidemiological information. The expected result  is Negative. Fact Sheet for Patients:  PinkCheek.be Fact Sheet for Healthcare Providers:  GravelBags.it This test is not yet ap proved or cleared by the [place] FDA and  has been authorized for detection and/or diagnosis of SARS-CoV-2 by FDA under an Emergency Use Authorization (EUA). This EUA will remain  in effect (meaning this test can be used) for the duration of the COVID-19 declaration under Section 564(b)(1) of the Act, 21 U.S.C. section 360bbb-3(b)(1), unless the authorization is terminated or revoked sooner.    Influenza A by PCR NEGATIVE NEGATIVE Final   Influenza B by PCR NEGATIVE NEGATIVE Final    Comment: (NOTE) The Xpert Xpress SARS-CoV-2/FLU/RSV assay is intended as an aid in  the diagnosis of influenza from Nasopharyngeal swab specimens and  should not be used as a sole  basis for treatment. Nasal washings and  aspirates are unacceptable for Xpert Xpress SARS-CoV-2/FLU/RSV  testing. Fact Sheet for Patients: PinkCheek.be Fact Sheet for Healthcare Providers: GravelBags.it This test is not yet approved or cleared by the [place] FDA and  has been authorized for detection and/or diagnosis of SARS-CoV-2 by  FDA under an Emergency Use Authorization (EUA). This EUA will remain  in effect (meaning this test can be used) for the duration of the  Covid-19 declaration under Section 564(b)(1) of the Act, 21  U.S.C. section 360bbb-3(b)(1), unless the authorization is  terminated or revoked. Performed at [facility], [place] [address], [place], [place] [address]     Radiology Reports CT Angio Chest PE W and/or Wo Contrast  Result Date: 04/15/2019 CLINICAL DATA:  Shortness of breath. EXAM: CT ANGIOGRAPHY CHEST WITH CONTRAST TECHNIQUE: Multidetector CT imaging of the chest was performed using the standard protocol during bolus administration of  intravenous contrast. Multiplanar CT image reconstructions and MIPs were obtained to evaluate the vascular anatomy. CONTRAST:  139m OMNIPAQUE IOHEXOL 350 MG/ML SOLN COMPARISON:  March 05, 2017. FINDINGS: Cardiovascular: Evaluation for pulmonary emboli is somewhat limited by respiratory motion artifact.Given this limitation, no PE was identified. The main pulmonary artery is significantly dilated measuring approximately 4.2 cm in diameter. There is no CT evidence of acute right heart strain. There is an ascending thoracic aortic aneurysm measuring approximately 4 cm. Atherosclerotic changes are noted of the thoracic aorta. Heart size is enlarged. There is no significant pericardial effusion. Mediastinum/Nodes: --No mediastinal or hilar lymphadenopathy. --No axillary lymphadenopathy. --No supraclavicular lymphadenopathy. --patient is status post right-sided hemithyroidectomy. --The esophagus is unremarkable Lungs/Pleura: There is a mosaic appearance of the lungs bilaterally. There is no pneumothorax. There is atelectasis at the lung bases with a trace left-sided pleural effusion. Upper Abdomen: There is likely underlying hepatic steatosis. The patient is status post prior cholecystectomy. Musculoskeletal: No chest wall abnormality. No acute or significant osseous findings. Patient is status post right-sided mastectomy. Review of the MIP images confirms the above findings. IMPRESSION: 1. Evaluation for pulmonary emboli is somewhat limited by respiratory motion artifact. Given this limitation, no PE was identified. 2. Marked dilatation of the main pulmonary artery, suggestive of pulmonary arterial hypertension. 3. Cardiomegaly. 4. Ascending thoracic aortic aneurysm measuring approximately 4 cm. Recommend annual imaging followup by CTA or MRA. This recommendation follows 2010 ACCF/AHA/AATS/ACR/ASA/SCA/SCAI/SIR/STS/SVM Guidelines for the Diagnosis and Management of Patients with Thoracic Aortic Disease. Circulation.  2010; 121:: C163-A453 Aortic aneurysm NOS (ICD10-I71.9) 5. Mosaic appearance of the lungs bilaterally, which can be seen with small vessel or small airway disease. Pulmonary hypertension or chronic thromboembolic disease can have a similar appearance. 6. Trace left-sided pleural effusion. Aortic Atherosclerosis (ICD10-I70.0). Electronically Signed   By: CConstance HolsterM.D.   On: 04/15/2019 21:26   DG Chest Portable 1 View  Result Date: 04/15/2019 CLINICAL DATA:  Shortness of breath, cough EXAM: PORTABLE CHEST 1 VIEW COMPARISON:  12/25/2017 FINDINGS: Cardiomediastinal contours are stable with enlargement of central pulmonary vasculature. Lungs are clear. No signs of pleural effusion. Visualized skeletal structures are unremarkable aside from cervical spinal fusion seen at the upper margin of the film. IMPRESSION: Prominence of central pulmonary vasculature may be secondary to pulmonary arterial hypertension, no consolidation or pleural effusion. Electronically Signed   By: GZetta BillsM.D.   On: 04/15/2019 18:59   ECHOCARDIOGRAM COMPLETE  Result Date: 04/17/2019   ECHOCARDIOGRAM REPORT   Patient Name:   BATALIE OROSDate of Exam: 04/17/2019 Medical  Rec #:  [id number]               Height:       65.0 in Accession #:    [id number]              Weight:       218.3 lb Date of Birth:  23-Oct-1950               BSA:          2.05 m Patient Age:    23 years                BP:           156/65 mmHg Patient Gender: F                       HR:           61 bpm. Exam Location:  Inpatient Procedure: 2D Echo and Strain Analysis Indications:    Chest Pain 786.50 / R07.9  History:        Patient has prior history of Echocardiogram examinations, most                 recent 02/09/2018. Risk Factors:Hypertension and Diabetes. Marked                 dilatation of the main pulmonary artery seen on CT.                 Past history of breast cancer. Hypothyroidsm.  Sonographer:    Darlina Sicilian RDCS Referring Phys:  [place]  1. Left ventricular ejection fraction, by visual estimation, is 55 to 60%. The left ventricle has normal function. There is severely increased left ventricular hypertrophy.  2. Asymmetric hypertorphy measuring 5m in basal septum (913min posterior wall)  3. Left ventricular diastolic parameters are indeterminate.  4. The mitral valve is normal in structure. Mild mitral valve regurgitation.  5. The aortic valve is tricuspid. Aortic valve regurgitation is trivial. No evidence of aortic valve sclerosis or stenosis.  6. The tricuspid valve is normal in structure.  7. The pulmonic valve was not well visualized. Pulmonic valve regurgitation is trivial.  8. There is mild dilatation of the ascending aorta measuring 38 mm.  9. Global right ventricle has normal systolic function.The right ventricular size is mildly enlarged. 10. Right atrial size was normal. 11. Left atrial size was normal. 12. Dilated main pulmonary artery 13. Appears to be normal pulmonary artery systolic pressure based on TR jet, though IVC is poorly visualized so unable to estimate RA pressure and PASP FINDINGS  Left Ventricle: Left ventricular ejection fraction, by visual estimation, is 55 to 60%. The left ventricle has normal function. The left ventricle has no regional wall motion abnormalities. The left ventricular internal cavity size was the left ventricle is normal in size. There is severely increased left ventricular hypertrophy. Asymmetric left ventricular hypertrophy. Left ventricular diastolic parameters are indeterminate. Right Ventricle: The right ventricular size is mildly enlarged. No increase in right ventricular wall thickness. Global RV systolic function is has normal systolic function. The tricuspid regurgitant velocity is 2.13 m/s, and with an assumed right atrial  pressure of 3 mmHg, the estimated right ventricular systolic pressure is normal at 21.1 mmHg. Left Atrium: Left atrial size was normal in size.  Right Atrium: Right atrial size was normal in size Pericardium: There is no evidence of pericardial effusion. Mitral Valve: The mitral  valve is normal in structure. Mild mitral valve regurgitation. Tricuspid Valve: The tricuspid valve is normal in structure. Tricuspid valve regurgitation is trivial. Aortic Valve: The aortic valve is tricuspid. Aortic valve regurgitation is trivial. The aortic valve is structurally normal, with no evidence of sclerosis or stenosis. Pulmonic Valve: The pulmonic valve was not well visualized. Pulmonic valve regurgitation is trivial. Pulmonic regurgitation is trivial. Aorta: Aortic dilatation noted. There is mild dilatation of the ascending aorta measuring 38 mm. Pulmonary Artery: The pulmonary artery is Dilated. IAS/Shunts: The interatrial septum was not well visualized.  LEFT VENTRICLE PLAX 2D LVIDd:         4.90 cm       Diastology LVIDs:         3.40 cm       LV e' lateral:   6.64 cm/s LV PW:         0.90 cm       LV E/e' lateral: 15.0 LV IVS:        1.60 cm       LV e' medial:    6.09 cm/s LVOT diam:     1.80 cm       LV E/e' medial:  16.3 LV SV:         65 ml LV SV Index:   30.06 LVOT Area:     2.54 cm  LV Volumes (MOD) LV area d, A2C:    32.70 cm LV area d, A4C:    32.90 cm LV area s, A2C:    20.00 cm LV area s, A4C:    21.10 cm LV major d, A2C:   8.22 cm LV major d, A4C:   8.15 cm LV major s, A2C:   7.30 cm LV major s, A4C:   7.25 cm LV vol d, MOD A2C: 106.0 ml LV vol d, MOD A4C: 110.0 ml LV vol s, MOD A2C: 46.8 ml LV vol s, MOD A4C: 51.3 ml LV SV MOD A2C:     59.2 ml LV SV MOD A4C:     110.0 ml LV SV MOD BP:      58.9 ml RIGHT VENTRICLE RV S prime:     15.30 cm/s TAPSE (M-mode): 2.2 cm LEFT ATRIUM             Index       RIGHT ATRIUM           Index LA diam:        4.20 cm 2.05 cm/m  RA Area:     16.70 cm LA Vol (A2C):   41.5 ml 20.21 ml/m RA Volume:   46.40 ml  22.60 ml/m LA Vol (A4C):   66.2 ml 32.24 ml/m LA Biplane Vol: 55.4 ml 26.98 ml/m  AORTIC VALVE LVOT Vmax:    92.40 cm/s LVOT Vmean:  66.200 cm/s LVOT VTI:    0.196 m  AORTA Ao Root diam: 3.10 cm Ao Asc diam:  3.80 cm MITRAL VALVE                        TRICUSPID VALVE MV Area (PHT): 5.27 cm             TR Peak grad:   18.1 mmHg MV PHT:        41.76 msec           TR Vmax:        213.00 cm/s MV Decel Time: 144 msec MV E velocity: 99.40 cm/s 103  cm/s  SHUNTS MV A velocity: 93.10 cm/s 70.3 cm/s Systemic VTI:  0.20 m MV E/A ratio:  1.07       1.5       Systemic Diam: 1.80 cm  Oswaldo Milian MD Electronically signed by Oswaldo Milian MD Signature Date/Time: 04/17/2019/10:37:05 AM    Final      Time Spent in minutes  30     Desiree Hane M.D on 04/17/2019 at 12:07 PM  To page go to [email or web address] - password [facility]

## 2019-04-17 NOTE — Progress Notes (Signed)
  Echocardiogram 2D Echocardiogram has been performed.  [name] M 04/17/2019, 8:34 AM

## 2019-04-17 NOTE — Progress Notes (Signed)
   Myoview came back low risk with no reversible ischemia. Updated patient on results. Coreg 12.5mg  twice daily was added for additional BP control. Will need to recheck BMET tomorrow morning to make sure renal function is stable.   Darreld Mclean, PA-C 04/17/2019 6:16 PM

## 2019-04-18 DIAGNOSIS — I712 Thoracic aortic aneurysm, without rupture: Secondary | ICD-10-CM

## 2019-04-18 DIAGNOSIS — R0789 Other chest pain: Secondary | ICD-10-CM

## 2019-04-18 DIAGNOSIS — N179 Acute kidney failure, unspecified: Secondary | ICD-10-CM

## 2019-04-18 DIAGNOSIS — M542 Cervicalgia: Secondary | ICD-10-CM

## 2019-04-18 DIAGNOSIS — I5032 Chronic diastolic (congestive) heart failure: Secondary | ICD-10-CM

## 2019-04-18 LAB — GLUCOSE, CAPILLARY
Glucose-Capillary: 134 mg/dL — ABNORMAL HIGH (ref 70–99)
Glucose-Capillary: 159 mg/dL — ABNORMAL HIGH (ref 70–99)
Glucose-Capillary: 162 mg/dL — ABNORMAL HIGH (ref 70–99)
Glucose-Capillary: 167 mg/dL — ABNORMAL HIGH (ref 70–99)
Glucose-Capillary: 187 mg/dL — ABNORMAL HIGH (ref 70–99)
Glucose-Capillary: 192 mg/dL — ABNORMAL HIGH (ref 70–99)

## 2019-04-18 LAB — CBC
HCT: 32.2 % — ABNORMAL LOW (ref 36.0–46.0)
Hemoglobin: 10.1 g/dL — ABNORMAL LOW (ref 12.0–15.0)
MCH: 28.9 pg (ref 26.0–34.0)
MCHC: 31.4 g/dL (ref 30.0–36.0)
MCV: 92.3 fL (ref 80.0–100.0)
Platelets: 268 10*3/uL (ref 150–400)
RBC: 3.49 MIL/uL — ABNORMAL LOW (ref 3.87–5.11)
RDW: 14.4 % (ref 11.5–15.5)
WBC: 7.1 10*3/uL (ref 4.0–10.5)
nRBC: 0.3 % — ABNORMAL HIGH (ref 0.0–0.2)

## 2019-04-18 LAB — BASIC METABOLIC PANEL
Anion gap: 7 (ref 5–15)
BUN: 24 mg/dL — ABNORMAL HIGH (ref 8–23)
CO2: 25 mmol/L (ref 22–32)
Calcium: 9.5 mg/dL (ref 8.9–10.3)
Chloride: 109 mmol/L (ref 98–111)
Creatinine, Ser: 2.18 mg/dL — ABNORMAL HIGH (ref 0.44–1.00)
GFR calc Af Amer: 26 mL/min — ABNORMAL LOW (ref >60)
GFR calc non Af Amer: 22 mL/min — ABNORMAL LOW (ref >60)
Glucose, Bld: 183 mg/dL — ABNORMAL HIGH (ref 70–99)
Potassium: 4.5 mmol/L (ref 3.5–5.1)
Sodium: 141 mmol/L (ref 135–145)

## 2019-04-18 MED ORDER — SODIUM CHLORIDE 0.9 % IV SOLN
500.0000 mL | Freq: Once | INTRAVENOUS | Status: AC
  Administered 2019-04-18: 10:00:00 500 mL via INTRAVENOUS

## 2019-04-18 MED ORDER — DICLOFENAC SODIUM 1 % EX GEL
4.0000 g | Freq: Four times a day (QID) | CUTANEOUS | Status: DC
  Administered 2019-04-18 – 2019-04-20 (×9): 4 g via TOPICAL
  Filled 2019-04-18: qty 100

## 2019-04-18 NOTE — Plan of Care (Signed)

## 2019-04-18 NOTE — Progress Notes (Signed)
Progress Note  Patient Name: [name] Date of Encounter: 04/18/2019  Primary Cardiologist: Will Meredith Leeds, MD   Subjective   Feeling OK.  Continues to have mild chest pain though improved.  +tender to touch  Inpatient Medications    Scheduled Meds: . amLODipine  10 mg Oral Daily  . aspirin EC  81 mg Oral Daily  . carvedilol  12.5 mg Oral BID WC  . diclofenac Sodium  4 g Topical QID  . heparin  5,000 Units Subcutaneous Q8H  . insulin aspart  0-9 Units Subcutaneous Q4H  . levothyroxine  100 mcg Oral Daily  . mouth rinse  15 mL Mouth Rinse BID  . metoCLOPramide  10 mg Oral TID AC & HS  . pantoprazole  40 mg Oral Daily  . pregabalin  75 mg Oral BID  . rosuvastatin  20 mg Oral q1800  . sodium chloride flush  3 mL Intravenous Once   Continuous Infusions: . sodium chloride     PRN Meds: albuterol, hydrALAZINE, morphine injection, nitroGLYCERIN   Vital Signs    Vitals:   04/17/19 1557 04/17/19 2109 04/18/19 0430 04/18/19 0512  BP: (!) 171/79 133/70 137/66   Pulse: 83 73 (!) 58 (!) 54  Resp: 16 19 20    Temp: 98.2 F (36.8 C) 98.7 F (37.1 C) 98.8 F (37.1 C)   TempSrc: Oral Oral Oral   SpO2: 96% 91% 95%   Weight:   99.2 kg   Height:        Intake/Output Summary (Last 24 hours) at 04/18/2019 0928 Last data filed at 04/18/2019 0500 Gross per 24 hour  Intake 1200 ml  Output 950 ml  Net 250 ml   Last 3 Weights 04/18/2019 04/17/2019 04/15/2019  Weight (lbs) 218 lb 12.8 oz 218 lb 4.8 oz 229 lb  Weight (kg) 99.247 kg 99.02 kg 103.874 kg      Telemetry    Sinus rhythm, sinus bradycardia.  - Personally Reviewed  ECG    n/a - Personally Reviewed  Physical Exam   VS:  BP 137/66 (BP Location: Left Arm)   Pulse (!) 54   Temp 98.8 F (37.1 C) (Oral)   Resp 20   Ht 5\' 5"  (1.651 m)   Wt 99.2 kg   SpO2 95%   BMI 36.41 kg/m  , BMI Body mass index is 36.41 kg/m. GENERAL:  Well appearing.  No acute distress HEENT: Pupils equal round and reactive,  fundi not visualized, oral mucosa unremarkable NECK:  No jugular venous distention, waveform within normal limits, carotid upstroke brisk and symmetric, no bruits LUNGS:  Clear to auscultation bilaterally CHEST: Chest wall tenderness to palpation HEART:  RRR.  PMI not displaced or sustained,S1 and S2 within normal limits, no S3, no S4, no clicks, no rubs, no murmurs ABD:  Flat, positive bowel sounds normal in frequency in pitch, no bruits, no rebound, no guarding, no midline pulsatile mass, no hepatomegaly, no splenomegaly EXT:  2 plus pulses throughout, no edema, no cyanosis no clubbing SKIN:  No rashes no nodules NEURO:  Cranial nerves II through XII grossly intact, motor grossly intact throughout PSYCH:  Cognitively intact, oriented to person place and time  Labs    High Sensitivity Troponin:   Recent Labs  Lab 04/15/19 1415 04/15/19 1920 04/16/19 2125 04/17/19 0030  TROPONINIHS 19* 21* 33* 34*      Chemistry Recent Labs  Lab 04/15/19 1415 04/16/19 2106 04/17/19 0227 04/18/19 0511  NA 140  --  140 141  K 4.6  --  4.1 4.5  CL 108  --  108 109  CO2 22  --  24 25  GLUCOSE 153*  --  193* 183*  BUN 28*  --  21 24*  CREATININE 1.27* 1.75* 1.90* 2.18*  CALCIUM 10.0  --  9.8 9.5  PROT  --   --  6.3*  --   ALBUMIN  --   --  2.9*  --   AST  --   --  23  --   ALT  --   --  18  --   ALKPHOS  --   --  50  --   BILITOT  --   --  0.5  --   GFRNONAA 43* 29* 26* 22*  GFRAA 50* 34* 30* 26*  ANIONGAP 10  --  8 7     Hematology Recent Labs  Lab 04/16/19 2106 04/17/19 0227 04/18/19 0511  WBC 8.4 7.5 7.1  RBC 4.09 3.58* 3.49*  HGB 11.8* 10.3* 10.1*  HCT 37.7 32.9* 32.2*  MCV 92.2 91.9 92.3  MCH 28.9 28.8 28.9  MCHC 31.3 31.3 31.4  RDW 14.4 14.4 14.4  PLT 282 238 268    BNPNo results for input(s): BNP, PROBNP in the last 168 hours.   DDimer No results for input(s): DDIMER in the last 168 hours.   Radiology    NM Myocar Multi W/Spect W/Wall Motion / EF  Result  Date: 04/17/2019  There was no ST segment deviation noted during stress.  No T wave inversion was noted during stress.  Defect 1: There is a medium defect of moderate severity present in the mid anterior, apical anterior, apical septal and apical lateral location. This defect is non reversible and is likely due to breast attenuation artifact.  Nuclear stress EF: 57%. The left ventricular ejection fraction is normal (55-65%).  This is a low risk study. There is a non reversible defect in the apical lateral region which is thought to be due to breast attenuation artifact. The LV contractility is normal throughout the LV including the apical lateral region.  The study is normal.    ECHOCARDIOGRAM COMPLETE  Result Date: 04/17/2019   ECHOCARDIOGRAM REPORT   Patient Name:   [name] Date of Exam: 04/17/2019 Medical Rec #:  [id number]               Height:       65.0 in Accession #:    [id number]              Weight:       218.3 lb Date of Birth:  24-May-1950               BSA:          2.05 m Patient Age:    69 years                BP:           156/65 mmHg Patient Gender: F                       HR:           61 bpm. Exam Location:  Inpatient Procedure: 2D Echo and Strain Analysis Indications:    Chest Pain 786.50 / R07.9  History:        Patient has prior history of Echocardiogram examinations, most  recent 02/09/2018. Risk Factors:Hypertension and Diabetes. Marked                 dilatation of the main pulmonary artery seen on CT.                 Past history of breast cancer. Hypothyroidsm.  Sonographer:    Darlina Sicilian RDCS Referring Phys: [place]  1. Left ventricular ejection fraction, by visual estimation, is 55 to 60%. The left ventricle has normal function. There is severely increased left ventricular hypertrophy.  2. Asymmetric hypertorphy measuring 70mm in basal septum (51mm in posterior wall)  3. Left ventricular diastolic parameters are indeterminate.  4.  The mitral valve is normal in structure. Mild mitral valve regurgitation.  5. The aortic valve is tricuspid. Aortic valve regurgitation is trivial. No evidence of aortic valve sclerosis or stenosis.  6. The tricuspid valve is normal in structure.  7. The pulmonic valve was not well visualized. Pulmonic valve regurgitation is trivial.  8. There is mild dilatation of the ascending aorta measuring 38 mm.  9. Global right ventricle has normal systolic function.The right ventricular size is mildly enlarged. 10. Right atrial size was normal. 11. Left atrial size was normal. 12. Dilated main pulmonary artery 13. Appears to be normal pulmonary artery systolic pressure based on TR jet, though IVC is poorly visualized so unable to estimate RA pressure and PASP FINDINGS  Left Ventricle: Left ventricular ejection fraction, by visual estimation, is 55 to 60%. The left ventricle has normal function. The left ventricle has no regional wall motion abnormalities. The left ventricular internal cavity size was the left ventricle is normal in size. There is severely increased left ventricular hypertrophy. Asymmetric left ventricular hypertrophy. Left ventricular diastolic parameters are indeterminate. Right Ventricle: The right ventricular size is mildly enlarged. No increase in right ventricular wall thickness. Global RV systolic function is has normal systolic function. The tricuspid regurgitant velocity is 2.13 m/s, and with an assumed right atrial  pressure of 3 mmHg, the estimated right ventricular systolic pressure is normal at 21.1 mmHg. Left Atrium: Left atrial size was normal in size. Right Atrium: Right atrial size was normal in size Pericardium: There is no evidence of pericardial effusion. Mitral Valve: The mitral valve is normal in structure. Mild mitral valve regurgitation. Tricuspid Valve: The tricuspid valve is normal in structure. Tricuspid valve regurgitation is trivial. Aortic Valve: The aortic valve is tricuspid.  Aortic valve regurgitation is trivial. The aortic valve is structurally normal, with no evidence of sclerosis or stenosis. Pulmonic Valve: The pulmonic valve was not well visualized. Pulmonic valve regurgitation is trivial. Pulmonic regurgitation is trivial. Aorta: Aortic dilatation noted. There is mild dilatation of the ascending aorta measuring 38 mm. Pulmonary Artery: The pulmonary artery is Dilated. IAS/Shunts: The interatrial septum was not well visualized.  LEFT VENTRICLE PLAX 2D LVIDd:         4.90 cm       Diastology LVIDs:         3.40 cm       LV e' lateral:   6.64 cm/s LV PW:         0.90 cm       LV E/e' lateral: 15.0 LV IVS:        1.60 cm       LV e' medial:    6.09 cm/s LVOT diam:     1.80 cm       LV E/e' medial:  16.3 LV SV:  65 ml LV SV Index:   30.06 LVOT Area:     2.54 cm  LV Volumes (MOD) LV area d, A2C:    32.70 cm LV area d, A4C:    32.90 cm LV area s, A2C:    20.00 cm LV area s, A4C:    21.10 cm LV major d, A2C:   8.22 cm LV major d, A4C:   8.15 cm LV major s, A2C:   7.30 cm LV major s, A4C:   7.25 cm LV vol d, MOD A2C: 106.0 ml LV vol d, MOD A4C: 110.0 ml LV vol s, MOD A2C: 46.8 ml LV vol s, MOD A4C: 51.3 ml LV SV MOD A2C:     59.2 ml LV SV MOD A4C:     110.0 ml LV SV MOD BP:      58.9 ml RIGHT VENTRICLE RV S prime:     15.30 cm/s TAPSE (M-mode): 2.2 cm LEFT ATRIUM             Index       RIGHT ATRIUM           Index LA diam:        4.20 cm 2.05 cm/m  RA Area:     16.70 cm LA Vol (A2C):   41.5 ml 20.21 ml/m RA Volume:   46.40 ml  22.60 ml/m LA Vol (A4C):   66.2 ml 32.24 ml/m LA Biplane Vol: 55.4 ml 26.98 ml/m  AORTIC VALVE LVOT Vmax:   92.40 cm/s LVOT Vmean:  66.200 cm/s LVOT VTI:    0.196 m  AORTA Ao Root diam: 3.10 cm Ao Asc diam:  3.80 cm MITRAL VALVE                        TRICUSPID VALVE MV Area (PHT): 5.27 cm             TR Peak grad:   18.1 mmHg MV PHT:        41.76 msec           TR Vmax:        213.00 cm/s MV Decel Time: 144 msec MV E velocity: 99.40 cm/s 103 cm/s   SHUNTS MV A velocity: 93.10 cm/s 70.3 cm/s Systemic VTI:  0.20 m MV E/A ratio:  1.07       1.5       Systemic Diam: 1.80 cm  Oswaldo Milian MD Electronically signed by Oswaldo Milian MD Signature Date/Time: 04/17/2019/10:37:05 AM    Final     Cardiac Studies   Echo 04/17/19: IMPRESSIONS   1. Left ventricular ejection fraction, by visual estimation, is 55 to 60%. The left ventricle has normal function. There is severely increased left ventricular hypertrophy.  2. Asymmetric hypertorphy measuring 80mm in basal septum (78mm in posterior wall)  3. Left ventricular diastolic parameters are indeterminate.  4. The mitral valve is normal in structure. Mild mitral valve regurgitation.  5. The aortic valve is tricuspid. Aortic valve regurgitation is trivial. No evidence of aortic valve sclerosis or stenosis.  6. The tricuspid valve is normal in structure.  7. The pulmonic valve was not well visualized. Pulmonic valve regurgitation is trivial.  8. There is mild dilatation of the ascending aorta measuring 38 mm.  9. Global right ventricle has normal systolic function.The right ventricular size is mildly enlarged. 10. Right atrial size was normal. 11. Left atrial size was normal. 12. Dilated main pulmonary artery 13. Appears to be  normal pulmonary artery systolic pressure based on TR jet, though IVC is poorly visualized so unable to estimate RA pressure and PASP  Lexiscan Myoview 04/17/19:  There was no ST segment deviation noted during stress.  No T wave inversion was noted during stress.  Defect 1: There is a medium defect of moderate severity present in the mid anterior, apical anterior, apical septal and apical lateral location. This defect is non reversible and is likely due to breast attenuation artifact.  Nuclear stress EF: 57%. The left ventricular ejection fraction is normal (55-65%).  This is a low risk study. There is a non reversible defect in the apical lateral region which is  thought to be due to breast attenuation artifact. The LV contractility is normal throughout the LV including the apical lateral region.  The study is normal.    Patient Profile     69 y.o. female with hypertension, diabetes, breast cancer s/p chemotherapy, peripheral neuropathy, GERD, ascending aorta aneurysm, and chronic chest pain admitted with chest pain and LE edema.    Assessment & Plan    # Atypical chest pain:  Symptoms are improved but persist this AM.  Lexiscan Myoview was negative and echo unremarkable.  I suspect this is neuromuscular.  It may be radicular coming from her neck.  She does have a history of sciatica and lower back pain.  We will order diclofenac gel.  Continue Lyrica.  # AKI:  Renal function worse today.  She has only received her home oral lasix.  Will give 1L IV fluid.  Suspect contrast nephropathy.  Home losartan is on hold.  # Essential hypertension: Blood pressures well-controlled.  Carvedilol was added given that losartan is being held for her AKI.  This is also helpful for her ascending aortic aneurysm.  Continue amlodipine.       For questions or updates, please contact [place] Please consult [email or web address] for contact info under        Signed, Skeet Latch, MD  04/18/2019, 9:28 AM

## 2019-04-18 NOTE — Progress Notes (Signed)
[facility]  PROGRESS NOTE  [name] UQJ:[id number] DOB: 27-May-1950 DOA: 04/15/2019 PCP: [name], MD Admit date - 04/15/2019   Admitting Physician Desiree Hane, MD  Outpatient Primary MD for the patient is [name], MD  LOS - 1 Brief Narrative   [name] is a 69 y.o. year old female with medical history significant for HTN, type 2 diabetes, CKD stage III, hypothyroidism who presented on 04/15/2019 with acute on chronic chest pain.  Chest pain has been ongoing for at least the past month worsened with exertion, located in substernal chest area, improved with rest.  Prior to admission patient noted associated dyspnea and increased fatigue as well as wheezing.     ED evaluation EKG was nonischemic, initial troponin minimally elevated and trending remained flat.  CTA chest obtained due to dyspnea was negative for PE (some limitation due to respiratory motion artifact) but showed some concern for pulmonary hypertension, cardiomegaly,  as well as incidental finding of 4 cm ascending aortic aneurysm with no signs of dissection given her risk factors elevated troponin cardiology was consulted and patient underwent Lexiscan Myoview   Subjective  Has right sided neck pain (ongoing for a while) And chest pain (reproducible) no SOB    A & P    Chest wall pain, has elements of typical and atypical cardiac features Myoview, low risk with no reversible ischemia Chest pain seems to be somewhat reproducible on my exam likely MSK -agree with diclofenac topical gelQID as needed, add cooling pad -No further ischemic evaluation,per cardiology  Hypertension, improved, at goal BP elevated to 200s during/after stress test on 1/7 Much better now, need good control given AAA --continue coreg 12.5 mg BID (added in hospital) -IV hydralazine as needed SBP greater than 170s -Coreg 12.5 mg twice daily added -Holding her losartan given questionable renal  function -Continue amlodipine  CKD, stage III, worsening Baseline creatinine 1.2-1.3 Continues to increase now 2.1, could be worsening in setting of contrast given from recent CT this admission --Agree with IVF, monitor output -Repeat BMP in a.m., avoid nephrotoxins  Type 2 diabetes with peripheral neuropathy, well controlled Fasting sugar at goal on sliding scale along here  Metformin -Continue Lyrica -Hold Metformin, continue to monitor CBG, sliding scale as needed  Wheezing, seems resolved No pulmonary abnormality seen on CT chest imaging -Monitor  4 cm ascending aorta aneurysm Incidental finding on CTA during admission No signs of dissection -Follow-up with outpatient imaging  Normocytic anemia Previous baseline seems to be within normal limits Hemoglobin currently stable from 10-11, no active signs of bleeding  Probable OSA -Outpatient sleep study arranged by cardiology  Chronic, stable medical problems Hypothyroidism- home Synthroid GERD-Protonix HLD-Crestor  Family Communication  : No family at bedside  Code Status : Cardiology  Disposition Plan  :  Needs IVF for renal function likely contrast nephropathy  Consults  : Cardiology  Procedures  : Lexiscan, 1/6  DVT Prophylaxis  : Heparin  Lab Results  Component Value Date   PLT 268 04/18/2019    Diet :  Diet Order            Diet heart healthy/carb modified Room service appropriate? Yes; Fluid consistency: Thin  Diet effective now               Inpatient Medications Scheduled Meds:  amLODipine  10 mg Oral Daily   aspirin EC  81 mg Oral Daily   carvedilol  12.5 mg Oral BID WC  diclofenac Sodium  4 g Topical QID   heparin  5,000 Units Subcutaneous Q8H   insulin aspart  0-9 Units Subcutaneous Q4H   levothyroxine  100 mcg Oral Daily   mouth rinse  15 mL Mouth Rinse BID   metoCLOPramide  10 mg Oral TID AC & HS   pantoprazole  40 mg Oral Daily   pregabalin  75 mg Oral BID    rosuvastatin  20 mg Oral q1800   sodium chloride flush  3 mL Intravenous Once   Continuous Infusions:  sodium chloride 500 mL (04/18/19 1006)   PRN Meds:.albuterol, hydrALAZINE, morphine injection, nitroGLYCERIN  Antibiotics  :   Anti-infectives (From admission, onward)   None        Objective   Vitals:   04/17/19 2109 04/18/19 0430 04/18/19 0512 04/18/19 0942  BP: 133/70 137/66  (!) 143/72  Pulse: 73 (!) 58 (!) 54 62  Resp: _0 Temp: 98.7 F (37.1 C) 98.8 F (37.1 C)  98.6 F (37 C)  TempSrc: Oral Oral  Oral  SpO2: 91% 95%  91%  Weight:  99.2 kg    Height:        SpO2: 91 %  Wt Readings from Last 3 Encounters:  04/18/19 99.2 kg  12/27/18 99.1 kg  12/12/18 97.5 kg     Intake/Output Summary (Last 24 hours) at 04/18/2019 1118 Last data filed at 04/18/2019 1010 Gross per 24 hour  Intake 1310 ml  Output 950 ml  Net 360 ml    Physical Exam:  Awake Alert, Oriented X 3, Normal affect, sitting in bedside chair No new F.N deficits,  [place].AT, No JVD Symmetrical Chest wall movement on room air, Good air movement bilaterally, CTAB RRR,No Gallops,Rubs or new Murmurs,  R breast mastectomy Chest wall tenderness reproducible with no rash erythema noted Anterior neck tenderness with normal ROM No paraspinal or spinal tenderness     I have personally reviewed the following:   Data Reviewed:  CBC Recent Labs  Lab 04/15/19 1415 04/16/19 2106 04/17/19 0227 04/18/19 0511  WBC 10.2 8.4 7.5 7.1  HGB 11.7* 11.8* 10.3* 10.1*  HCT 36.9 37.7 32.9* 32.2*  PLT 271 282 238 268  MCV 93.2 92.2 91.9 92.3  MCH 29.5 28.9 28.8 28.9  MCHC 31.7 31.3 31.3 31.4  RDW 14.5 14.4 14.4 14.4    Chemistries  Recent Labs  Lab 04/15/19 1415 04/16/19 2106 04/17/19 0227 04/18/19 0511  NA 140  --  140 141  K 4.6  --  4.1 4.5  CL 108  --  108 109  CO2 22  --  24 25  GLUCOSE 153*  --  193* 183*  BUN 28*  --  21 24*  CREATININE 1.27* 1.75* 1.90* 2.18*  CALCIUM 10.0  --   9.8 9.5  AST  --   --  23  --   ALT  --   --  18  --   ALKPHOS  --   --  50  --   BILITOT  --   --  0.5  --    ------------------------------------------------------------------------------------------------------------------ Recent Labs    04/17/19 0227  CHOL 193  HDL 41  LDLCALC 110*  TRIG 208*  CHOLHDL 4.7    Lab Results  Component Value Date   HGBA1C 10.3 (H) 03/01/2019   ------------------------------------------------------------------------------------------------------------------ No results for input(s): TSH, T4TOTAL, T3FREE, THYROIDAB in the last 72 hours.  Invalid input(s): FREET3 ------------------------------------------------------------------------------------------------------------------ No results for input(s): VITAMINB12, FOLATE, FERRITIN, TIBC,  IRON, RETICCTPCT in the last 72 hours.  Coagulation profile No results for input(s): INR, PROTIME in the last 168 hours.  No results for input(s): DDIMER in the last 72 hours.  Cardiac Enzymes No results for input(s): CKMB, TROPONINI, MYOGLOBIN in the last 168 hours.  Invalid input(s): CK ------------------------------------------------------------------------------------------------------------------    Component Value Date/Time   BNP 42.9 03/04/2017 2005   BNP 43.9 10/03/2016 1419    Micro Results Recent Results (from the past 240 hour(s))  SARS Coronavirus 2 Ag (30 min TAT) - Nasal Swab (BD Veritor Kit)     Status: None   Collection Time: 04/15/19  3:10 PM   Specimen: Nasal Swab (BD Veritor Kit)  Result Value Ref Range Status   SARS Coronavirus 2 Ag NEGATIVE NEGATIVE Final    Comment: (NOTE) SARS-CoV-2 antigen NOT DETECTED.  Negative results are presumptive.  Negative results do not preclude SARS-CoV-2 infection and should not be used as the sole basis for treatment or other patient management decisions, including infection  control decisions, particularly in the presence of clinical signs and    symptoms consistent with COVID-19, or in those who have been in contact with the virus.  Negative results must be combined with clinical observations, patient history, and epidemiological information. The expected result is Negative. Fact Sheet for Patients: PodPark.tn Fact Sheet for Healthcare Providers: GiftContent.is This test is not yet approved or cleared by the [place] FDA and  has been authorized for detection and/or diagnosis of SARS-CoV-2 by FDA under an Emergency Use Authorization (EUA).  This EUA will remain in effect (meaning this test can be used) for the duration of  the COVID-19 de claration under Section 564(b)(1) of the Act, 21 U.S.C. section 360bbb-3(b)(1), unless the authorization is terminated or revoked sooner. Performed at [facility], [place], [address] [place], [place] [address]   Respiratory Panel by RT PCR (Flu A&B, Covid) - Nasopharyngeal Swab     Status: None   Collection Time: 04/15/19  7:45 PM   Specimen: Nasopharyngeal Swab  Result Value Ref Range Status   SARS Coronavirus 2 by RT PCR NEGATIVE NEGATIVE Final    Comment: (NOTE) SARS-CoV-2 target nucleic acids are NOT DETECTED. The SARS-CoV-2 RNA is generally detectable in upper respiratoy specimens during the acute phase of infection. The lowest concentration of SARS-CoV-2 viral copies this assay can detect is 131 copies/mL. A negative result does not preclude SARS-Cov-2 infection and should not be used as the sole basis for treatment or other patient management decisions. A negative result may occur with  improper specimen collection/handling, submission of specimen other than nasopharyngeal swab, presence of viral mutation(s) within the areas targeted by this assay, and inadequate number of viral copies (<131 copies/mL). A negative result must be combined with clinical observations, patient history, and epidemiological  information. The expected result is Negative. Fact Sheet for Patients:  PinkCheek.be Fact Sheet for Healthcare Providers:  GravelBags.it This test is not yet ap proved or cleared by the [place] FDA and  has been authorized for detection and/or diagnosis of SARS-CoV-2 by FDA under an Emergency Use Authorization (EUA). This EUA will remain  in effect (meaning this test can be used) for the duration of the COVID-19 declaration under Section 564(b)(1) of the Act, 21 U.S.C. section 360bbb-3(b)(1), unless the authorization is terminated or revoked sooner.    Influenza A by PCR NEGATIVE NEGATIVE Final   Influenza B by PCR NEGATIVE NEGATIVE Final    Comment: (NOTE) The Xpert Xpress  SARS-CoV-2/FLU/RSV assay is intended as an aid in  the diagnosis of influenza from Nasopharyngeal swab specimens and  should not be used as a sole basis for treatment. Nasal washings and  aspirates are unacceptable for Xpert Xpress SARS-CoV-2/FLU/RSV  testing. Fact Sheet for Patients: PinkCheek.be Fact Sheet for Healthcare Providers: GravelBags.it This test is not yet approved or cleared by the [place] FDA and  has been authorized for detection and/or diagnosis of SARS-CoV-2 by  FDA under an Emergency Use Authorization (EUA). This EUA will remain  in effect (meaning this test can be used) for the duration of the  Covid-19 declaration under Section 564(b)(1) of the Act, 21  U.S.C. section 360bbb-3(b)(1), unless the authorization is  terminated or revoked. Performed at [facility], [place] [address] [place], [place] [address]     Radiology Reports CT Angio Chest PE W and/or Wo Contrast  Result Date: 04/15/2019 CLINICAL DATA:  Shortness of breath. EXAM: CT ANGIOGRAPHY CHEST WITH CONTRAST TECHNIQUE: Multidetector CT imaging of the chest was performed using the standard protocol  during bolus administration of intravenous contrast. Multiplanar CT image reconstructions and MIPs were obtained to evaluate the vascular anatomy. CONTRAST:  160m OMNIPAQUE IOHEXOL 350 MG/ML SOLN COMPARISON:  March 05, 2017. FINDINGS: Cardiovascular: Evaluation for pulmonary emboli is somewhat limited by respiratory motion artifact.Given this limitation, no PE was identified. The main pulmonary artery is significantly dilated measuring approximately 4.2 cm in diameter. There is no CT evidence of acute right heart strain. There is an ascending thoracic aortic aneurysm measuring approximately 4 cm. Atherosclerotic changes are noted of the thoracic aorta. Heart size is enlarged. There is no significant pericardial effusion. Mediastinum/Nodes: --No mediastinal or hilar lymphadenopathy. --No axillary lymphadenopathy. --No supraclavicular lymphadenopathy. --patient is status post right-sided hemithyroidectomy. --The esophagus is unremarkable Lungs/Pleura: There is a mosaic appearance of the lungs bilaterally. There is no pneumothorax. There is atelectasis at the lung bases with a trace left-sided pleural effusion. Upper Abdomen: There is likely underlying hepatic steatosis. The patient is status post prior cholecystectomy. Musculoskeletal: No chest wall abnormality. No acute or significant osseous findings. Patient is status post right-sided mastectomy. Review of the MIP images confirms the above findings. IMPRESSION: 1. Evaluation for pulmonary emboli is somewhat limited by respiratory motion artifact. Given this limitation, no PE was identified. 2. Marked dilatation of the main pulmonary artery, suggestive of pulmonary arterial hypertension. 3. Cardiomegaly. 4. Ascending thoracic aortic aneurysm measuring approximately 4 cm. Recommend annual imaging followup by CTA or MRA. This recommendation follows 2010 ACCF/AHA/AATS/ACR/ASA/SCA/SCAI/SIR/STS/SVM Guidelines for the Diagnosis and Management of Patients with Thoracic  Aortic Disease. Circulation. 2010; 121:: G811-X726 Aortic aneurysm NOS (ICD10-I71.9) 5. Mosaic appearance of the lungs bilaterally, which can be seen with small vessel or small airway disease. Pulmonary hypertension or chronic thromboembolic disease can have a similar appearance. 6. Trace left-sided pleural effusion. Aortic Atherosclerosis (ICD10-I70.0). Electronically Signed   By: CConstance HolsterM.D.   On: 04/15/2019 21:26   NM Myocar Multi W/Spect W/Wall Motion / EF  Result Date: 04/17/2019  There was no ST segment deviation noted during stress.  No T wave inversion was noted during stress.  Defect 1: There is a medium defect of moderate severity present in the mid anterior, apical anterior, apical septal and apical lateral location. This defect is non reversible and is likely due to breast attenuation artifact.  Nuclear stress EF: 57%. The left ventricular ejection fraction is normal (55-65%).  This is a low risk study. There is a non reversible  defect in the apical lateral region which is thought to be due to breast attenuation artifact. The LV contractility is normal throughout the LV including the apical lateral region.  The study is normal.    DG Chest Portable 1 View  Result Date: 04/15/2019 CLINICAL DATA:  Shortness of breath, cough EXAM: PORTABLE CHEST 1 VIEW COMPARISON:  12/25/2017 FINDINGS: Cardiomediastinal contours are stable with enlargement of central pulmonary vasculature. Lungs are clear. No signs of pleural effusion. Visualized skeletal structures are unremarkable aside from cervical spinal fusion seen at the upper margin of the film. IMPRESSION: Prominence of central pulmonary vasculature may be secondary to pulmonary arterial hypertension, no consolidation or pleural effusion. Electronically Signed   By: Zetta Bills M.D.   On: 04/15/2019 18:59   ECHOCARDIOGRAM COMPLETE  Result Date: 04/17/2019   ECHOCARDIOGRAM REPORT   Patient Name:   [name] Date of Exam:  04/17/2019 Medical Rec #:  [id number]               Height:       65.0 in Accession #:    [id number]              Weight:       218.3 lb Date of Birth:  Sep 16, 1950               BSA:          2.05 m Patient Age:    57 years                BP:           156/65 mmHg Patient Gender: F                       HR:           61 bpm. Exam Location:  Inpatient Procedure: 2D Echo and Strain Analysis Indications:    Chest Pain 786.50 / R07.9  History:        Patient has prior history of Echocardiogram examinations, most                 recent 02/09/2018. Risk Factors:Hypertension and Diabetes. Marked                 dilatation of the main pulmonary artery seen on CT.                 Past history of breast cancer. Hypothyroidsm.  Sonographer:    Darlina Sicilian RDCS Referring Phys: [place]  1. Left ventricular ejection fraction, by visual estimation, is 55 to 60%. The left ventricle has normal function. There is severely increased left ventricular hypertrophy.  2. Asymmetric hypertorphy measuring 32m in basal septum (918min posterior wall)  3. Left ventricular diastolic parameters are indeterminate.  4. The mitral valve is normal in structure. Mild mitral valve regurgitation.  5. The aortic valve is tricuspid. Aortic valve regurgitation is trivial. No evidence of aortic valve sclerosis or stenosis.  6. The tricuspid valve is normal in structure.  7. The pulmonic valve was not well visualized. Pulmonic valve regurgitation is trivial.  8. There is mild dilatation of the ascending aorta measuring 38 mm.  9. Global right ventricle has normal systolic function.The right ventricular size is mildly enlarged. 10. Right atrial size was normal. 11. Left atrial size was normal. 12. Dilated main pulmonary artery 13. Appears to be normal pulmonary artery systolic pressure based on TR jet, though IVC is  poorly visualized so unable to estimate RA pressure and PASP FINDINGS  Left Ventricle: Left ventricular ejection fraction, by  visual estimation, is 55 to 60%. The left ventricle has normal function. The left ventricle has no regional wall motion abnormalities. The left ventricular internal cavity size was the left ventricle is normal in size. There is severely increased left ventricular hypertrophy. Asymmetric left ventricular hypertrophy. Left ventricular diastolic parameters are indeterminate. Right Ventricle: The right ventricular size is mildly enlarged. No increase in right ventricular wall thickness. Global RV systolic function is has normal systolic function. The tricuspid regurgitant velocity is 2.13 m/s, and with an assumed right atrial  pressure of 3 mmHg, the estimated right ventricular systolic pressure is normal at 21.1 mmHg. Left Atrium: Left atrial size was normal in size. Right Atrium: Right atrial size was normal in size Pericardium: There is no evidence of pericardial effusion. Mitral Valve: The mitral valve is normal in structure. Mild mitral valve regurgitation. Tricuspid Valve: The tricuspid valve is normal in structure. Tricuspid valve regurgitation is trivial. Aortic Valve: The aortic valve is tricuspid. Aortic valve regurgitation is trivial. The aortic valve is structurally normal, with no evidence of sclerosis or stenosis. Pulmonic Valve: The pulmonic valve was not well visualized. Pulmonic valve regurgitation is trivial. Pulmonic regurgitation is trivial. Aorta: Aortic dilatation noted. There is mild dilatation of the ascending aorta measuring 38 mm. Pulmonary Artery: The pulmonary artery is Dilated. IAS/Shunts: The interatrial septum was not well visualized.  LEFT VENTRICLE PLAX 2D LVIDd:         4.90 cm       Diastology LVIDs:         3.40 cm       LV e' lateral:   6.64 cm/s LV PW:         0.90 cm       LV E/e' lateral: 15.0 LV IVS:        1.60 cm       LV e' medial:    6.09 cm/s LVOT diam:     1.80 cm       LV E/e' medial:  16.3 LV SV:         65 ml LV SV Index:   30.06 LVOT Area:     2.54 cm  LV Volumes (MOD)  LV area d, A2C:    32.70 cm LV area d, A4C:    32.90 cm LV area s, A2C:    20.00 cm LV area s, A4C:    21.10 cm LV major d, A2C:   8.22 cm LV major d, A4C:   8.15 cm LV major s, A2C:   7.30 cm LV major s, A4C:   7.25 cm LV vol d, MOD A2C: 106.0 ml LV vol d, MOD A4C: 110.0 ml LV vol s, MOD A2C: 46.8 ml LV vol s, MOD A4C: 51.3 ml LV SV MOD A2C:     59.2 ml LV SV MOD A4C:     110.0 ml LV SV MOD BP:      58.9 ml RIGHT VENTRICLE RV S prime:     15.30 cm/s TAPSE (M-mode): 2.2 cm LEFT ATRIUM             Index       RIGHT ATRIUM           Index LA diam:        4.20 cm 2.05 cm/m  RA Area:     16.70 cm LA Vol (A2C):   41.5  ml 20.21 ml/m RA Volume:   46.40 ml  22.60 ml/m LA Vol (A4C):   66.2 ml 32.24 ml/m LA Biplane Vol: 55.4 ml 26.98 ml/m  AORTIC VALVE LVOT Vmax:   92.40 cm/s LVOT Vmean:  66.200 cm/s LVOT VTI:    0.196 m  AORTA Ao Root diam: 3.10 cm Ao Asc diam:  3.80 cm MITRAL VALVE                        TRICUSPID VALVE MV Area (PHT): 5.27 cm             TR Peak grad:   18.1 mmHg MV PHT:        41.76 msec           TR Vmax:        213.00 cm/s MV Decel Time: 144 msec MV E velocity: 99.40 cm/s 103 cm/s  SHUNTS MV A velocity: 93.10 cm/s 70.3 cm/s Systemic VTI:  0.20 m MV E/A ratio:  1.07       1.5       Systemic Diam: 1.80 cm  Oswaldo Milian MD Electronically signed by Oswaldo Milian MD Signature Date/Time: 04/17/2019/10:37:05 AM    Final      Time Spent in minutes  30     Desiree Hane M.D on 04/18/2019 at 11:18 AM  To page go to [email or web address] - password [email or web address]

## 2019-04-19 ENCOUNTER — Inpatient Hospital Stay (HOSPITAL_COMMUNITY): Payer: Medicare HMO

## 2019-04-19 DIAGNOSIS — R0789 Other chest pain: Principal | ICD-10-CM

## 2019-04-19 LAB — URINALYSIS, ROUTINE W REFLEX MICROSCOPIC
Bacteria, UA: NONE SEEN
Bilirubin Urine: NEGATIVE
Glucose, UA: 50 mg/dL — AB
Hgb urine dipstick: NEGATIVE
Ketones, ur: NEGATIVE mg/dL
Leukocytes,Ua: NEGATIVE
Nitrite: NEGATIVE
Protein, ur: 100 mg/dL — AB
Specific Gravity, Urine: 1.014 (ref 1.005–1.030)
pH: 5 (ref 5.0–8.0)

## 2019-04-19 LAB — CBC
HCT: 32.8 % — ABNORMAL LOW (ref 36.0–46.0)
Hemoglobin: 10 g/dL — ABNORMAL LOW (ref 12.0–15.0)
MCH: 28.9 pg (ref 26.0–34.0)
MCHC: 30.5 g/dL (ref 30.0–36.0)
MCV: 94.8 fL (ref 80.0–100.0)
Platelets: 264 10*3/uL (ref 150–400)
RBC: 3.46 MIL/uL — ABNORMAL LOW (ref 3.87–5.11)
RDW: 14.4 % (ref 11.5–15.5)
WBC: 7.2 10*3/uL (ref 4.0–10.5)
nRBC: 0 % (ref 0.0–0.2)

## 2019-04-19 LAB — BASIC METABOLIC PANEL
Anion gap: 6 (ref 5–15)
BUN: 28 mg/dL — ABNORMAL HIGH (ref 8–23)
CO2: 25 mmol/L (ref 22–32)
Calcium: 9.4 mg/dL (ref 8.9–10.3)
Chloride: 108 mmol/L (ref 98–111)
Creatinine, Ser: 2.13 mg/dL — ABNORMAL HIGH (ref 0.44–1.00)
GFR calc Af Amer: 26 mL/min — ABNORMAL LOW (ref >60)
GFR calc non Af Amer: 23 mL/min — ABNORMAL LOW (ref >60)
Glucose, Bld: 144 mg/dL — ABNORMAL HIGH (ref 70–99)
Potassium: 4.3 mmol/L (ref 3.5–5.1)
Sodium: 139 mmol/L (ref 135–145)

## 2019-04-19 LAB — GLUCOSE, CAPILLARY
Glucose-Capillary: 131 mg/dL — ABNORMAL HIGH (ref 70–99)
Glucose-Capillary: 140 mg/dL — ABNORMAL HIGH (ref 70–99)
Glucose-Capillary: 145 mg/dL — ABNORMAL HIGH (ref 70–99)
Glucose-Capillary: 160 mg/dL — ABNORMAL HIGH (ref 70–99)
Glucose-Capillary: 176 mg/dL — ABNORMAL HIGH (ref 70–99)
Glucose-Capillary: 205 mg/dL — ABNORMAL HIGH (ref 70–99)

## 2019-04-19 MED ORDER — CARVEDILOL 6.25 MG PO TABS
6.2500 mg | ORAL_TABLET | Freq: Two times a day (BID) | ORAL | Status: DC
  Administered 2019-04-19 – 2019-04-20 (×3): 6.25 mg via ORAL
  Filled 2019-04-19 (×3): qty 1

## 2019-04-19 MED ORDER — PANTOPRAZOLE SODIUM 40 MG PO TBEC
40.0000 mg | DELAYED_RELEASE_TABLET | Freq: Two times a day (BID) | ORAL | Status: DC
  Administered 2019-04-19 – 2019-04-20 (×2): 40 mg via ORAL
  Filled 2019-04-19 (×2): qty 1

## 2019-04-19 NOTE — Progress Notes (Signed)
  Mobility Specialist Criteria Algorithm Info.  SATURATION QUALIFICATIONS: (This note is used to comply with regulatory documentation for home oxygen)  Patient Saturations on Room Air at Rest = 98%  Patient Saturations on Room Air while Ambulating = 96%  Patient Saturations on n/a Liters of oxygen while Ambulating = n/a%  Please briefly explain why patient needs home oxygen:  Mobility:  HOB elevated:HOB 30 Activity: Ambulated in hall;Dangled on edge of bed Range of motion: Active;All extremities Level of assistance: Modified independent, requires aide device or extra time Assistive device: Front wheel walker Minutes sitting in chair: No data recorded  Minutes stood: 5 minutes Minutes ambulated: 5 minutes Distance ambulated (ft): 320 ft Mobility response: Tolerated well Bed Position: Semi-fowlers    04/19/2019 12:47 PM

## 2019-04-19 NOTE — Progress Notes (Signed)
[facility]  PROGRESS NOTE  [name] MVE:[id number] DOB: 06-19-1950 DOA: 04/15/2019 PCP: [name], MD Admit date - 04/15/2019   Admitting Physician Desiree Hane, MD  Outpatient Primary MD for the patient is [name], MD  LOS - 2 Brief Narrative   [name] is a 69 y.o. year old female with medical history significant for HTN, type 2 diabetes, CKD stage III, hypothyroidism who presented on 04/15/2019 with acute on chronic chest pain.  Chest pain has been ongoing for at least the past month worsened with exertion, located in substernal chest area, improved with rest.  Prior to admission patient noted associated dyspnea and increased fatigue as well as wheezing.     ED evaluation EKG was nonischemic, initial troponin minimally elevated and trending remained flat.  CTA chest obtained due to dyspnea was negative for PE (some limitation due to respiratory motion artifact) but showed some concern for pulmonary hypertension, cardiomegaly,  as well as incidental finding of 4 cm ascending aortic aneurysm with no signs of dissection given her risk factors elevated troponin cardiology was consulted and patient underwent Lexiscan Myoview   Subjective  Worsening chronic cough and reports increased wheezing   A & P   Cough Could be interval pna? Admitting CTA/CXr was unremarkable but given cough and wheeze need to reevaluate. CXR nonacute with no opacities or venous congestion.  Question could be GERD/acid reflux related -Increase PPI to twice daily dosing and see - incentive spirometer, walking oxygen test prior to discharge  Chest wall pain, has elements of typical and atypical cardiac features Myoview, low risk with no reversible ischemia Chest pain seems to be somewhat reproducible on my exam likely MSK -agree with diclofenac topical gelQID as needed, add cooling pad -No further ischemic evaluation,per cardiology  Hypertension, improved, at  goal BP elevated to 200s during/after stress test on 1/7 Much better now, need good control given AAA --continue coreg 12.5 mg BID (added in hospital) -IV hydralazine as needed SBP greater than 170s -Coreg 12.5 mg twice daily added -Holding her losartan given questionable renal function -Continue amlodipine  CKD, stage III, worsening Baseline creatinine 1.2-1.3 Continues to increase now 2.1, could be worsening in setting of contrast given from recent CT this admission --Agree with IVF, monitor output -Repeat BMP in a.m., avoid nephrotoxins  Type 2 diabetes with peripheral neuropathy, well controlled Fasting sugar at goal on sliding scale along here  Metformin -Continue Lyrica -Hold Metformin, continue to monitor CBG, sliding scale as needed  Wheezing, seems resolved No pulmonary abnormality seen on CT chest imaging -Monitor  4 cm ascending aorta aneurysm Incidental finding on CTA during admission No signs of dissection -Follow-up with outpatient imaging  Normocytic anemia Previous baseline seems to be within normal limits Hemoglobin currently stable from 10-11, no active signs of bleeding  Probable OSA -Outpatient sleep study arranged by cardiology  Chronic, stable medical problems Hypothyroidism- home Synthroid GERD-Protonix HLD-Crestor  Family Communication  : No family at bedside  Code Status : Cardiology  Disposition Plan  : If renal function remains stable or improves over the next 24 hours patient should be safe for medical discharge, will also need ambulatory walking O2 test  Consults  : Cardiology  Procedures  : Lexiscan, 1/6  DVT Prophylaxis  : Heparin  Lab Results  Component Value Date   PLT 264 04/19/2019    Diet :  Diet Order            Diet heart  healthy/carb modified Room service appropriate? Yes; Fluid consistency: Thin  Diet effective now               Inpatient Medications Scheduled Meds: . amLODipine  10 mg Oral Daily  .  aspirin EC  81 mg Oral Daily  . carvedilol  12.5 mg Oral BID WC  . diclofenac Sodium  4 g Topical QID  . heparin  5,000 Units Subcutaneous Q8H  . insulin aspart  0-9 Units Subcutaneous Q4H  . levothyroxine  100 mcg Oral Daily  . mouth rinse  15 mL Mouth Rinse BID  . metoCLOPramide  10 mg Oral TID AC & HS  . pantoprazole  40 mg Oral Daily  . pregabalin  75 mg Oral BID  . rosuvastatin  20 mg Oral q1800  . sodium chloride flush  3 mL Intravenous Once   Continuous Infusions:  PRN Meds:.albuterol, hydrALAZINE, morphine injection, nitroGLYCERIN  Antibiotics  :   Anti-infectives (From admission, onward)   None        Objective   Vitals:   04/18/19 1735 04/18/19 2037 04/19/19 0131 04/19/19 0438  BP:  115/60 (!) 121/56 (!) 144/61  Pulse: 64 (!) 56 (!) 53 (!) 54  Resp:  '18 18 18  ' Temp:  99.1 F (37.3 C) 99.2 F (37.3 C) 98.9 F (37.2 C)  TempSrc:  Oral Oral Oral  SpO2:  96% 92% 93%  Weight:    99.7 kg  Height:        SpO2: 93 % O2 Flow Rate (L/min): 0 L/min  Wt Readings from Last 3 Encounters:  04/19/19 99.7 kg  12/27/18 99.1 kg  12/12/18 97.5 kg     Intake/Output Summary (Last 24 hours) at 04/19/2019 0850 Last data filed at 04/19/2019 0428 Gross per 24 hour  Intake 880 ml  Output 800 ml  Net 80 ml    Physical Exam:  Awake Alert, Oriented X 3, Normal affect, sitting in bedside chair No new F.N deficits,  [place].AT, No JVD Symmetrical Chest wall movement on room air, Good air movement bilaterally, CTAB RRR,No Gallops,Rubs or new Murmurs,  R breast mastectomy Chest wall tenderness reproducible with no rash erythema (decreased from prior exam) noted Anterior neck tenderness with normal ROM No paraspinal or spinal tenderness     I have personally reviewed the following:   Data Reviewed:  CBC Recent Labs  Lab 04/15/19 1415 04/16/19 2106 04/17/19 0227 04/18/19 0511 04/19/19 0458  WBC 10.2 8.4 7.5 7.1 7.2  HGB 11.7* 11.8* 10.3* 10.1* 10.0*  HCT 36.9  37.7 32.9* 32.2* 32.8*  PLT 271 282 238 268 264  MCV 93.2 92.2 91.9 92.3 94.8  MCH 29.5 28.9 28.8 28.9 28.9  MCHC 31.7 31.3 31.3 31.4 30.5  RDW 14.5 14.4 14.4 14.4 14.4    Chemistries  Recent Labs  Lab 04/15/19 1415 04/16/19 2106 04/17/19 0227 04/18/19 0511 04/19/19 0458  NA 140  --  140 141 139  K 4.6  --  4.1 4.5 4.3  CL 108  --  108 109 108  CO2 22  --  '24 25 25  ' GLUCOSE 153*  --  193* 183* 144*  BUN 28*  --  21 24* 28*  CREATININE 1.27* 1.75* 1.90* 2.18* 2.13*  CALCIUM 10.0  --  9.8 9.5 9.4  AST  --   --  23  --   --   ALT  --   --  18  --   --   ALKPHOS  --   --  50  --   --   BILITOT  --   --  0.5  --   --    ------------------------------------------------------------------------------------------------------------------ Recent Labs    04/17/19 0227  CHOL 193  HDL 41  LDLCALC 110*  TRIG 208*  CHOLHDL 4.7    Lab Results  Component Value Date   HGBA1C 10.3 (H) 03/01/2019   ------------------------------------------------------------------------------------------------------------------ No results for input(s): TSH, T4TOTAL, T3FREE, THYROIDAB in the last 72 hours.  Invalid input(s): FREET3 ------------------------------------------------------------------------------------------------------------------ No results for input(s): VITAMINB12, FOLATE, FERRITIN, TIBC, IRON, RETICCTPCT in the last 72 hours.  Coagulation profile No results for input(s): INR, PROTIME in the last 168 hours.  No results for input(s): DDIMER in the last 72 hours.  Cardiac Enzymes No results for input(s): CKMB, TROPONINI, MYOGLOBIN in the last 168 hours.  Invalid input(s): CK ------------------------------------------------------------------------------------------------------------------    Component Value Date/Time   BNP 42.9 03/04/2017 2005   BNP 43.9 10/03/2016 1419    Micro Results Recent Results (from the past 240 hour(s))  SARS Coronavirus 2 Ag (30 min TAT) - Nasal  Swab (BD Veritor Kit)     Status: None   Collection Time: 04/15/19  3:10 PM   Specimen: Nasal Swab (BD Veritor Kit)  Result Value Ref Range Status   SARS Coronavirus 2 Ag NEGATIVE NEGATIVE Final    Comment: (NOTE) SARS-CoV-2 antigen NOT DETECTED.  Negative results are presumptive.  Negative results do not preclude SARS-CoV-2 infection and should not be used as the sole basis for treatment or other patient management decisions, including infection  control decisions, particularly in the presence of clinical signs and  symptoms consistent with COVID-19, or in those who have been in contact with the virus.  Negative results must be combined with clinical observations, patient history, and epidemiological information. The expected result is Negative. Fact Sheet for Patients: PodPark.tn Fact Sheet for Healthcare Providers: GiftContent.is This test is not yet approved or cleared by the [place] FDA and  has been authorized for detection and/or diagnosis of SARS-CoV-2 by FDA under an Emergency Use Authorization (EUA).  This EUA will remain in effect (meaning this test can be used) for the duration of  the COVID-19 de claration under Section 564(b)(1) of the Act, 21 U.S.C. section 360bbb-3(b)(1), unless the authorization is terminated or revoked sooner. Performed at [facility], [address] [place], [place] [address]   Respiratory Panel by RT PCR (Flu A&B, Covid) - Nasopharyngeal Swab     Status: None   Collection Time: 04/15/19  7:45 PM   Specimen: Nasopharyngeal Swab  Result Value Ref Range Status   SARS Coronavirus 2 by RT PCR NEGATIVE NEGATIVE Final    Comment: (NOTE) SARS-CoV-2 target nucleic acids are NOT DETECTED. The SARS-CoV-2 RNA is generally detectable in upper respiratoy specimens during the acute phase of infection. The lowest concentration of SARS-CoV-2 viral copies this assay can detect is 131  copies/mL. A negative result does not preclude SARS-Cov-2 infection and should not be used as the sole basis for treatment or other patient management decisions. A negative result may occur with  improper specimen collection/handling, submission of specimen other than nasopharyngeal swab, presence of viral mutation(s) within the areas targeted by this assay, and inadequate number of viral copies (<131 copies/mL). A negative result must be combined with clinical observations, patient history, and epidemiological information. The expected result is Negative. Fact Sheet for Patients:  PinkCheek.be Fact Sheet for Healthcare Providers:  GravelBags.it This test is not yet ap proved or  cleared by the [place] and  has been authorized for detection and/or diagnosis of SARS-CoV-2 by FDA under an Emergency Use Authorization (EUA). This EUA will remain  in effect (meaning this test can be used) for the duration of the COVID-19 declaration under Section 564(b)(1) of the Act, 21 U.S.C. section 360bbb-3(b)(1), unless the authorization is terminated or revoked sooner.    Influenza A by PCR NEGATIVE NEGATIVE Final   Influenza B by PCR NEGATIVE NEGATIVE Final    Comment: (NOTE) The Xpert Xpress SARS-CoV-2/FLU/RSV assay is intended as an aid in  the diagnosis of influenza from Nasopharyngeal swab specimens and  should not be used as a sole basis for treatment. Nasal washings and  aspirates are unacceptable for Xpert Xpress SARS-CoV-2/FLU/RSV  testing. Fact Sheet for Patients: PinkCheek.be Fact Sheet for Healthcare Providers: GravelBags.it This test is not yet approved or cleared by the [place] FDA and  has been authorized for detection and/or diagnosis of SARS-CoV-2 by  FDA under an Emergency Use Authorization (EUA). This EUA will remain  in effect (meaning this test can  be used) for the duration of the  Covid-19 declaration under Section 564(b)(1) of the Act, 21  U.S.C. section 360bbb-3(b)(1), unless the authorization is  terminated or revoked. Performed at [facility], [place] [address] [place], [place] [address]     Radiology Reports CT Angio Chest PE W and/or Wo Contrast  Result Date: 04/15/2019 CLINICAL DATA:  Shortness of breath. EXAM: CT ANGIOGRAPHY CHEST WITH CONTRAST TECHNIQUE: Multidetector CT imaging of the chest was performed using the standard protocol during bolus administration of intravenous contrast. Multiplanar CT image reconstructions and MIPs were obtained to evaluate the vascular anatomy. CONTRAST:  148m OMNIPAQUE IOHEXOL 350 MG/ML SOLN COMPARISON:  March 05, 2017. FINDINGS: Cardiovascular: Evaluation for pulmonary emboli is somewhat limited by respiratory motion artifact.Given this limitation, no PE was identified. The main pulmonary artery is significantly dilated measuring approximately 4.2 cm in diameter. There is no CT evidence of acute right heart strain. There is an ascending thoracic aortic aneurysm measuring approximately 4 cm. Atherosclerotic changes are noted of the thoracic aorta. Heart size is enlarged. There is no significant pericardial effusion. Mediastinum/Nodes: --No mediastinal or hilar lymphadenopathy. --No axillary lymphadenopathy. --No supraclavicular lymphadenopathy. --patient is status post right-sided hemithyroidectomy. --The esophagus is unremarkable Lungs/Pleura: There is a mosaic appearance of the lungs bilaterally. There is no pneumothorax. There is atelectasis at the lung bases with a trace left-sided pleural effusion. Upper Abdomen: There is likely underlying hepatic steatosis. The patient is status post prior cholecystectomy. Musculoskeletal: No chest wall abnormality. No acute or significant osseous findings. Patient is status post right-sided mastectomy. Review of the MIP images confirms the above findings.  IMPRESSION: 1. Evaluation for pulmonary emboli is somewhat limited by respiratory motion artifact. Given this limitation, no PE was identified. 2. Marked dilatation of the main pulmonary artery, suggestive of pulmonary arterial hypertension. 3. Cardiomegaly. 4. Ascending thoracic aortic aneurysm measuring approximately 4 cm. Recommend annual imaging followup by CTA or MRA. This recommendation follows 2010 ACCF/AHA/AATS/ACR/ASA/SCA/SCAI/SIR/STS/SVM Guidelines for the Diagnosis and Management of Patients with Thoracic Aortic Disease. Circulation. 2010; 121:: J500-X381 Aortic aneurysm NOS (ICD10-I71.9) 5. Mosaic appearance of the lungs bilaterally, which can be seen with small vessel or small airway disease. Pulmonary hypertension or chronic thromboembolic disease can have a similar appearance. 6. Trace left-sided pleural effusion. Aortic Atherosclerosis (ICD10-I70.0). Electronically Signed   By: CConstance HolsterM.D.   On: 04/15/2019 21:26   NM Myocar Multi  W/Spect W/Wall Motion / EF  Result Date: 04/17/2019  There was no ST segment deviation noted during stress.  No T wave inversion was noted during stress.  Defect 1: There is a medium defect of moderate severity present in the mid anterior, apical anterior, apical septal and apical lateral location. This defect is non reversible and is likely due to breast attenuation artifact.  Nuclear stress EF: 57%. The left ventricular ejection fraction is normal (55-65%).  This is a low risk study. There is a non reversible defect in the apical lateral region which is thought to be due to breast attenuation artifact. The LV contractility is normal throughout the LV including the apical lateral region.  The study is normal.    DG Chest Portable 1 View  Result Date: 04/15/2019 CLINICAL DATA:  Shortness of breath, cough EXAM: PORTABLE CHEST 1 VIEW COMPARISON:  12/25/2017 FINDINGS: Cardiomediastinal contours are stable with enlargement of central pulmonary  vasculature. Lungs are clear. No signs of pleural effusion. Visualized skeletal structures are unremarkable aside from cervical spinal fusion seen at the upper margin of the film. IMPRESSION: Prominence of central pulmonary vasculature may be secondary to pulmonary arterial hypertension, no consolidation or pleural effusion. Electronically Signed   By: Zetta Bills M.D.   On: 04/15/2019 18:59   ECHOCARDIOGRAM COMPLETE  Result Date: 04/17/2019   ECHOCARDIOGRAM REPORT   Patient Name:   ALEXSIA KLINDT Date of Exam: 04/17/2019 Medical Rec #:  [id number]               Height:       65.0 in Accession #:    [id number]              Weight:       218.3 lb Date of Birth:  02-15-1951               BSA:          2.05 m Patient Age:    39 years                BP:           156/65 mmHg Patient Gender: F                       HR:           61 bpm. Exam Location:  Inpatient Procedure: 2D Echo and Strain Analysis Indications:    Chest Pain 786.50 / R07.9  History:        Patient has prior history of Echocardiogram examinations, most                 recent 02/09/2018. Risk Factors:Hypertension and Diabetes. Marked                 dilatation of the main pulmonary artery seen on CT.                 Past history of breast cancer. Hypothyroidsm.  Sonographer:    Darlina Sicilian RDCS Referring Phys: [place]  1. Left ventricular ejection fraction, by visual estimation, is 55 to 60%. The left ventricle has normal function. There is severely increased left ventricular hypertrophy.  2. Asymmetric hypertorphy measuring 1m in basal septum (961min posterior wall)  3. Left ventricular diastolic parameters are indeterminate.  4. The mitral valve is normal in structure. Mild mitral valve regurgitation.  5. The aortic valve is tricuspid. Aortic valve regurgitation is trivial. No evidence  of aortic valve sclerosis or stenosis.  6. The tricuspid valve is normal in structure.  7. The pulmonic valve was not well visualized.  Pulmonic valve regurgitation is trivial.  8. There is mild dilatation of the ascending aorta measuring 38 mm.  9. Global right ventricle has normal systolic function.The right ventricular size is mildly enlarged. 10. Right atrial size was normal. 11. Left atrial size was normal. 12. Dilated main pulmonary artery 13. Appears to be normal pulmonary artery systolic pressure based on TR jet, though IVC is poorly visualized so unable to estimate RA pressure and PASP FINDINGS  Left Ventricle: Left ventricular ejection fraction, by visual estimation, is 55 to 60%. The left ventricle has normal function. The left ventricle has no regional wall motion abnormalities. The left ventricular internal cavity size was the left ventricle is normal in size. There is severely increased left ventricular hypertrophy. Asymmetric left ventricular hypertrophy. Left ventricular diastolic parameters are indeterminate. Right Ventricle: The right ventricular size is mildly enlarged. No increase in right ventricular wall thickness. Global RV systolic function is has normal systolic function. The tricuspid regurgitant velocity is 2.13 m/s, and with an assumed right atrial  pressure of 3 mmHg, the estimated right ventricular systolic pressure is normal at 21.1 mmHg. Left Atrium: Left atrial size was normal in size. Right Atrium: Right atrial size was normal in size Pericardium: There is no evidence of pericardial effusion. Mitral Valve: The mitral valve is normal in structure. Mild mitral valve regurgitation. Tricuspid Valve: The tricuspid valve is normal in structure. Tricuspid valve regurgitation is trivial. Aortic Valve: The aortic valve is tricuspid. Aortic valve regurgitation is trivial. The aortic valve is structurally normal, with no evidence of sclerosis or stenosis. Pulmonic Valve: The pulmonic valve was not well visualized. Pulmonic valve regurgitation is trivial. Pulmonic regurgitation is trivial. Aorta: Aortic dilatation noted. There  is mild dilatation of the ascending aorta measuring 38 mm. Pulmonary Artery: The pulmonary artery is Dilated. IAS/Shunts: The interatrial septum was not well visualized.  LEFT VENTRICLE PLAX 2D LVIDd:         4.90 cm       Diastology LVIDs:         3.40 cm       LV e' lateral:   6.64 cm/s LV PW:         0.90 cm       LV E/e' lateral: 15.0 LV IVS:        1.60 cm       LV e' medial:    6.09 cm/s LVOT diam:     1.80 cm       LV E/e' medial:  16.3 LV SV:         65 ml LV SV Index:   30.06 LVOT Area:     2.54 cm  LV Volumes (MOD) LV area d, A2C:    32.70 cm LV area d, A4C:    32.90 cm LV area s, A2C:    20.00 cm LV area s, A4C:    21.10 cm LV major d, A2C:   8.22 cm LV major d, A4C:   8.15 cm LV major s, A2C:   7.30 cm LV major s, A4C:   7.25 cm LV vol d, MOD A2C: 106.0 ml LV vol d, MOD A4C: 110.0 ml LV vol s, MOD A2C: 46.8 ml LV vol s, MOD A4C: 51.3 ml LV SV MOD A2C:     59.2 ml LV SV MOD A4C:  110.0 ml LV SV MOD BP:      58.9 ml RIGHT VENTRICLE RV S prime:     15.30 cm/s TAPSE (M-mode): 2.2 cm LEFT ATRIUM             Index       RIGHT ATRIUM           Index LA diam:        4.20 cm 2.05 cm/m  RA Area:     16.70 cm LA Vol (A2C):   41.5 ml 20.21 ml/m RA Volume:   46.40 ml  22.60 ml/m LA Vol (A4C):   66.2 ml 32.24 ml/m LA Biplane Vol: 55.4 ml 26.98 ml/m  AORTIC VALVE LVOT Vmax:   92.40 cm/s LVOT Vmean:  66.200 cm/s LVOT VTI:    0.196 m  AORTA Ao Root diam: 3.10 cm Ao Asc diam:  3.80 cm MITRAL VALVE                        TRICUSPID VALVE MV Area (PHT): 5.27 cm             TR Peak grad:   18.1 mmHg MV PHT:        41.76 msec           TR Vmax:        213.00 cm/s MV Decel Time: 144 msec MV E velocity: 99.40 cm/s 103 cm/s  SHUNTS MV A velocity: 93.10 cm/s 70.3 cm/s Systemic VTI:  0.20 m MV E/A ratio:  1.07       1.5       Systemic Diam: 1.80 cm  Oswaldo Milian MD Electronically signed by Oswaldo Milian MD Signature Date/Time: 04/17/2019/10:37:05 AM    Final      Time Spent in minutes   30     Desiree Hane M.D on 04/19/2019 at 8:50 AM  To page go to [email or web address] - password [email or web address]

## 2019-04-19 NOTE — Progress Notes (Signed)
Progress Note  Patient Name: [name] Date of Encounter: 04/19/2019  Primary Cardiologist: Will Meredith Leeds, MD   Subjective   She continues to have a non-productive cough and shortness of breath with minimal exertion or talking.  Chest tightness still present but better.  Inpatient Medications    Scheduled Meds: . amLODipine  10 mg Oral Daily  . aspirin EC  81 mg Oral Daily  . carvedilol  12.5 mg Oral BID WC  . diclofenac Sodium  4 g Topical QID  . heparin  5,000 Units Subcutaneous Q8H  . insulin aspart  0-9 Units Subcutaneous Q4H  . levothyroxine  100 mcg Oral Daily  . mouth rinse  15 mL Mouth Rinse BID  . metoCLOPramide  10 mg Oral TID AC & HS  . pantoprazole  40 mg Oral Daily  . pregabalin  75 mg Oral BID  . rosuvastatin  20 mg Oral q1800  . sodium chloride flush  3 mL Intravenous Once   Continuous Infusions:  PRN Meds: albuterol, hydrALAZINE, morphine injection, nitroGLYCERIN   Vital Signs    Vitals:   04/18/19 2037 04/19/19 0131 04/19/19 0438 04/19/19 0853  BP: 115/60 (!) 121/56 (!) 144/61 (!) 159/67  Pulse: (!) 56 (!) 53 (!) 54   Resp: 18 18 18    Temp: 99.1 F (37.3 C) 99.2 F (37.3 C) 98.9 F (37.2 C)   TempSrc: Oral Oral Oral   SpO2: 96% 92% 93% 95%  Weight:   99.7 kg   Height:        Intake/Output Summary (Last 24 hours) at 04/19/2019 0954 Last data filed at 04/19/2019 0428 Gross per 24 hour  Intake 880 ml  Output 800 ml  Net 80 ml   Last 3 Weights 04/19/2019 04/18/2019 04/17/2019  Weight (lbs) 219 lb 11.2 oz 218 lb 12.8 oz 218 lb 4.8 oz  Weight (kg) 99.655 kg 99.247 kg 99.02 kg      Telemetry    Sinus rhythm, sinus bradycardia.  - Personally Reviewed  ECG    n/a - Personally Reviewed  Physical Exam   VS:  BP (!) 159/67 (BP Location: Left Arm)   Pulse (!) 54   Temp 98.9 F (37.2 C) (Oral)   Resp 18   Ht 5\' 5"  (1.651 m)   Wt 99.7 kg   SpO2 95%   BMI 36.56 kg/m  , BMI Body mass index is 36.56 kg/m. GENERAL:  Well  appearing.  No acute distress HEENT: Pupils equal round and reactive, fundi not visualized, oral mucosa unremarkable NECK:  No jugular venous distention, waveform within normal limits, carotid upstroke brisk and symmetric, no bruits LUNGS:  Clear to auscultation bilaterally CHEST: + Chest wall tenderness to palpation HEART:  RRR.  PMI not displaced or sustained,S1 and S2 within normal limits, no S3, no S4, no clicks, no rubs, no murmurs ABD:  Flat, positive bowel sounds normal in frequency in pitch, no bruits, no rebound, no guarding, no midline pulsatile mass, no hepatomegaly, no splenomegaly EXT:  2 plus pulses throughout, no edema, no cyanosis no clubbing SKIN:  No rashes no nodules NEURO:  Cranial nerves II through XII grossly intact, motor grossly intact throughout PSYCH:  Cognitively intact, oriented to person place and time  Labs    High Sensitivity Troponin:   Recent Labs  Lab 04/15/19 1415 04/15/19 1920 04/16/19 2125 04/17/19 0030  TROPONINIHS 19* 21* 33* 34*      Chemistry Recent Labs  Lab 04/17/19 0227 04/18/19 0511 04/19/19  0458  NA 140 141 139  K 4.1 4.5 4.3  CL 108 109 108  CO2 24 25 25   GLUCOSE 193* 183* 144*  BUN 21 24* 28*  CREATININE 1.90* 2.18* 2.13*  CALCIUM 9.8 9.5 9.4  PROT 6.3*  --   --   ALBUMIN 2.9*  --   --   AST 23  --   --   ALT 18  --   --   ALKPHOS 50  --   --   BILITOT 0.5  --   --   GFRNONAA 26* 22* 23*  GFRAA 30* 26* 26*  ANIONGAP 8 7 6      Hematology Recent Labs  Lab 04/17/19 0227 04/18/19 0511 04/19/19 0458  WBC 7.5 7.1 7.2  RBC 3.58* 3.49* 3.46*  HGB 10.3* 10.1* 10.0*  HCT 32.9* 32.2* 32.8*  MCV 91.9 92.3 94.8  MCH 28.8 28.9 28.9  MCHC 31.3 31.4 30.5  RDW 14.4 14.4 14.4  PLT 238 268 264    BNPNo results for input(s): BNP, PROBNP in the last 168 hours.   DDimer No results for input(s): DDIMER in the last 168 hours.   Radiology    NM Myocar Multi W/Spect W/Wall Motion / EF  Result Date: 04/17/2019  There was  no ST segment deviation noted during stress.  No T wave inversion was noted during stress.  Defect 1: There is a medium defect of moderate severity present in the mid anterior, apical anterior, apical septal and apical lateral location. This defect is non reversible and is likely due to breast attenuation artifact.  Nuclear stress EF: 57%. The left ventricular ejection fraction is normal (55-65%).  This is a low risk study. There is a non reversible defect in the apical lateral region which is thought to be due to breast attenuation artifact. The LV contractility is normal throughout the LV including the apical lateral region.  The study is normal.    DG CHEST PORT 1 VIEW  Result Date: 04/19/2019 CLINICAL DATA:  Cough. EXAM: PORTABLE CHEST 1 VIEW COMPARISON:  01/04/202, 12/25/2017.  CT 04/15/2019. FINDINGS: Mediastinum and hilar structures normal. Mild cardiomegaly. No pulmonary venous congestion. Stable mild bilateral pleural-parenchymal thickening consistent with scarring. No acute infiltrate. No pleural effusion or pneumothorax. Surgical clips right chest. IMPRESSION: 1.  Mild cardiomegaly.  No pulmonary venous congestion. 2. No acute pulmonary disease. Disease identified. Chest is stable from prior exam. Electronically Signed   By: Marcello Moores  Register   On: 04/19/2019 09:33    Cardiac Studies   Echo 04/17/19: IMPRESSIONS   1. Left ventricular ejection fraction, by visual estimation, is 55 to 60%. The left ventricle has normal function. There is severely increased left ventricular hypertrophy.  2. Asymmetric hypertorphy measuring 49mm in basal septum (24mm in posterior wall)  3. Left ventricular diastolic parameters are indeterminate.  4. The mitral valve is normal in structure. Mild mitral valve regurgitation.  5. The aortic valve is tricuspid. Aortic valve regurgitation is trivial. No evidence of aortic valve sclerosis or stenosis.  6. The tricuspid valve is normal in structure.  7. The  pulmonic valve was not well visualized. Pulmonic valve regurgitation is trivial.  8. There is mild dilatation of the ascending aorta measuring 38 mm.  9. Global right ventricle has normal systolic function.The right ventricular size is mildly enlarged. 10. Right atrial size was normal. 11. Left atrial size was normal. 12. Dilated main pulmonary artery 13. Appears to be normal pulmonary artery systolic pressure based on TR jet,  though IVC is poorly visualized so unable to estimate RA pressure and PASP  Lexiscan Myoview 04/17/19:  There was no ST segment deviation noted during stress.  No T wave inversion was noted during stress.  Defect 1: There is a medium defect of moderate severity present in the mid anterior, apical anterior, apical septal and apical lateral location. This defect is non reversible and is likely due to breast attenuation artifact.  Nuclear stress EF: 57%. The left ventricular ejection fraction is normal (55-65%).  This is a low risk study. There is a non reversible defect in the apical lateral region which is thought to be due to breast attenuation artifact. The LV contractility is normal throughout the LV including the apical lateral region.  The study is normal.    Patient Profile     69 y.o. female with hypertension, diabetes, breast cancer s/p chemotherapy, peripheral neuropathy, GERD, ascending aorta aneurysm, and chronic chest pain admitted with chest pain and LE edema.    Assessment & Plan    # Atypical chest pain:  Symptoms are better with diclofenac but still persist.  She has chest wall tenderness to palpation.  Lexiscan Myoview was negative and echo unremarkable.  I suspect this is musculoskeletal and/or radicular.  She does have a history of sciatica and lower back pain.  Diclofenac gel started this admission.  Continue Lyrica.  # AKI:  Renal function is stabilizing.  Suspect contrast nephropathy.  Home losartan is on hold.  She received 1L IV fluid  yesterday.  # Essential hypertension: Blood pressure is above goal.  She hasn't been receiving AM carvedilol 2/2 bradycardia.  This may be related to untreated OSA as well.  Will reduce the dose to 6.25mg  bid.  She needs outpatient sleep study.  Home losartan on hold 2/2 AKI.  Continue amlodipine.  # Cough: # Shortness of breath: Non-productive cough that she reports has been present for a while.  No fevers/chills/leukocytosis.  COVID-19 negative.   CXR this am without acute findings.  Possibly GERD related, though she takes PPI at home and here.  Chest CT suggested PAH, but pulmonary pressure was not elevated on echo, though there wasn't a good TR jet to fully assess.  CT also showed small vessel/small airway disease concerning for PAH or CTEPD.  However V/Q was negative for chronic PE.  She needs outpatient sleep study for likely OSA as above.  Will check ANA, RF, ANCA, anti-CCP and anti-Smith antibodies to work up [facility].  Recommend outpatient PFTs.  # Ascending aorta aneurysm: 4.0 cm.  Needs repeat imaging in a year.  BP control as above, including beta blocker added this admission.  For questions or updates, please contact [facility] Please consult [email or web address] for contact info under        Signed, Skeet Latch, MD  04/19/2019, 9:54 AM

## 2019-04-19 NOTE — Plan of Care (Signed)

## 2019-04-20 DIAGNOSIS — R05 Cough: Secondary | ICD-10-CM

## 2019-04-20 DIAGNOSIS — R062 Wheezing: Secondary | ICD-10-CM

## 2019-04-20 LAB — ENA+DNA/DS+ANTICH+CENTRO+JO...
Anti JO-1: <0.2 AI (ref 0.0–0.9)
Centromere Ab Screen: <0.2 AI (ref 0.0–0.9)
Chromatin Ab SerPl-aCnc: <0.2 AI (ref 0.0–0.9)
ENA SM Ab Ser-aCnc: <0.2 AI (ref 0.0–0.9)
Ribonucleic Protein: <0.2 AI (ref 0.0–0.9)
SSA (Ro) (ENA) Antibody, IgG: 1 AI — ABNORMAL HIGH (ref 0.0–0.9)
SSB (La) (ENA) Antibody, IgG: <0.2 AI (ref 0.0–0.9)
Scleroderma (Scl-70) (ENA) Antibody, IgG: <0.2 AI (ref 0.0–0.9)
ds DNA Ab: <1 IU/mL (ref 0–9)

## 2019-04-20 LAB — GLUCOSE, CAPILLARY
Glucose-Capillary: 165 mg/dL — ABNORMAL HIGH (ref 70–99)
Glucose-Capillary: 188 mg/dL — ABNORMAL HIGH (ref 70–99)
Glucose-Capillary: 202 mg/dL — ABNORMAL HIGH (ref 70–99)
Glucose-Capillary: 220 mg/dL — ABNORMAL HIGH (ref 70–99)

## 2019-04-20 LAB — BASIC METABOLIC PANEL
Anion gap: 9 (ref 5–15)
BUN: 30 mg/dL — ABNORMAL HIGH (ref 8–23)
CO2: 24 mmol/L (ref 22–32)
Calcium: 9.4 mg/dL (ref 8.9–10.3)
Chloride: 105 mmol/L (ref 98–111)
Creatinine, Ser: 2.07 mg/dL — ABNORMAL HIGH (ref 0.44–1.00)
GFR calc Af Amer: 27 mL/min — ABNORMAL LOW (ref >60)
GFR calc non Af Amer: 24 mL/min — ABNORMAL LOW (ref >60)
Glucose, Bld: 223 mg/dL — ABNORMAL HIGH (ref 70–99)
Potassium: 4.4 mmol/L (ref 3.5–5.1)
Sodium: 138 mmol/L (ref 135–145)

## 2019-04-20 LAB — ANTI-SMITH ANTIBODY: ENA SM Ab Ser-aCnc: <0.2 AI (ref 0.0–0.9)

## 2019-04-20 LAB — ANA W/REFLEX IF POSITIVE: Anti Nuclear Antibody (ANA): POSITIVE — AB

## 2019-04-20 MED ORDER — ALBUTEROL SULFATE HFA 108 (90 BASE) MCG/ACT IN AERS: 2.0000 | INHALATION_SPRAY | Freq: Four times a day (QID) | RESPIRATORY_TRACT | 0 refills | Status: DC | PRN

## 2019-04-20 MED ORDER — ROSUVASTATIN CALCIUM 20 MG PO TABS: 20.0000 mg | ORAL_TABLET | Freq: Every day | ORAL | 0 refills | Status: DC

## 2019-04-20 MED ORDER — DICLOFENAC SODIUM 1 % EX GEL: 4.0000 g | Freq: Four times a day (QID) | CUTANEOUS | 0 refills | Status: DC | PRN

## 2019-04-20 MED ORDER — CARVEDILOL 6.25 MG PO TABS: 6.2500 mg | ORAL_TABLET | Freq: Two times a day (BID) | ORAL | 0 refills | Status: DC

## 2019-04-20 NOTE — Discharge Summary (Signed)
[name] XEN:[id number] DOB: [birth year] DOA: 04/15/2019  PCP: [name], MD  Admit date: 04/15/2019 Discharge date: 04/20/2019  Admitted From: Home Disposition:  Home  Recommendations for Outpatient Follow-up:  1. Follow up with PCP in 1-2 weeks 2. Please obtain BMP(Creatinine check) in one week 3. New medications: coreg, albuterol, diclofenac gel. Hold losartan 4. Needs outpatient follow-up imaging for incidental finding of 4 cm without signs of dissection, Outpatient PFTs 5. Please follow up on the following pending results:  Home Health:NO Equipment/Devices:none Discharge Condition:Stable CODE STATUS: FULL Diet recommendation: Heart Healthy / Carb Modified   Brief/Interim Summary: History of present illness:  [name] is a 69 y.o. year old female with medical history significant for HTN, type 2 diabetes, CKD stage III, hypothyroidism who presented on 04/15/2019 with acute on chronic chest pain.  Chest pain has been ongoing for at least the past month worsened with exertion, located in substernal chest area, improved with rest.  Prior to admission patient noted associated dyspnea and increased fatigue as well as wheezing. . Remaining hospital course addressed in problem based format below:   Hospital Course:   Chest wall pain with elements of typical cardiac features, ACS rule out ED evaluation EKG was nonischemic, initial troponin minimally elevated and trending remained flat.  CTA chest obtained due to dyspnea was negative for PE (some limitation due to respiratory motion artifact) but showed some concern for pulmonary hypertension, cardiomegaly,  as well as incidental finding of 4 cm ascending aortic aneurysm with no signs of dissection given her risk factors elevated troponin cardiology was consulted, TTE showed preserved EF with severe LVH but no wall motion abnormalities.  Patient underwent Lexiscan Myoview which showed no reversible ischemia.   Patient's chest pain seemed to be more musculoskeletal in nature given reproducibility -Chest pain improved with topical diclofenac, and supportive care -Patient does not need any further ischemic evaluation per cardiology  Hypertension, BP elevated 200s on 1/7 after her stress test.  Likely occurring in the setting of holding her home ARB. Had improved control with addition of Coreg by cardiology -On discharge continue Coreg 6.25 mg twice daily, continue holding losartan given AKI -Continue lidocaine -PCP follow-up in 1 week  AKI on CKD stage III worsening Baseline creatinine 1.2-1.3 Creatinine peaked remained stable at 2.1 Suspect some element of contrast nephropathy related to CT imaging this admission Was given IV fluids Maintain normal output -Recommend repeat BMP in a week with PCP, avoid nephrotoxins, instructed to hold losartan.  Type 2 diabetes with peripheral neuropathy -Continue home Metformin on discharge  Wheezing, seems nonpulmonary Chest x-ray unremarkable for pneumonia CT chest also showed no lung abnormalities other than possible PAH Patient maintained normal oxygen saturations with ambulation prior to discharge Could be related to GI/acid reflux -Continue PPI -B12 as needed -PCP follow-up  4 cm ascending aorta aneurysm Incidental finding on CTA during admission No signs of dissection -Needs outpatient follow-up imaging  Incidental finding concern: Pulmonary arterial hypertension Unknown CTA small vessel/small airway disease concerning for PAH.  Study was somewhat limited but negative for any PE -ANA was positive but RF, ANCA, anti-- CCP anti-smooth antibodies were all unremarkable -recommend outpatient PFTs  Probable OSA Needs outpatient sleep study, to be arranged by cardiology   Consultations:  Cardiology   Subjective: Breathing better Hears occasional wheeze Occasional non productive cough  Discharge Exam: Vitals:   04/19/19 2044 04/20/19  0420  BP: 139/64 136/66  Pulse: (!) 59 (!) 54  Resp: 20 20  Temp: 98.8 F (37.1 C) 98.5 F (36.9 C)  SpO2: 93% 91%   Vitals:   04/19/19 1201 04/19/19 1634 04/19/19 2044 04/20/19 0420  BP:  (!) 130/57 139/64 136/66  Pulse: 65 (!) 55 (!) 59 (!) 54  Resp:  '20 20 20  ' Temp:  98.6 F (37 C) 98.8 F (37.1 C) 98.5 F (36.9 C)  TempSrc:  Oral Oral Oral  SpO2:  96% 93% 91%  Weight:    99.7 kg  Height:        General: Lying in bed, no apparent distress Eyes: EOMI, anicteric ENT: Oral Mucosa clear and moist Cardiovascular: regular rate and rhythm, no murmurs, rubs or gallops, no edema, Respiratory: Normal respiratory effort on room air, lungs clear to auscultation bilaterally MSK: minimal chest wall tenderness ( much improved from prior exams) Abdomen: soft, non-distended, non-tender, normal bowel sounds Skin: No Rash Neurologic: Grossly no focal neuro deficit.Mental status AAOx3, speech normal, Psychiatric:Appropriate affect, and mood  Discharge Diagnoses:  Active Problems:   Chronic diastolic heart failure (HCC)   Morbid obesity (HCC)   Hypertension   GERD (gastroesophageal reflux disease)   Postsurgical hypothyroidism   Nonspecific chest pain   Dyspnea   Hypoxia   Ascending aortic aneurysm (HCC)   AKI (acute kidney injury) (HCC)   Chest wall pain   Neck pain    Discharge Instructions  Discharge Instructions    Diet - low sodium heart healthy   Complete by: As directed    Increase activity slowly   Complete by: As directed      Allergies as of 04/20/2019      Reactions   Dilaudid [hydromorphone Hcl] Nausea And Vomiting   Lipitor [atorvastatin] Other (See Comments)   Body & Muscle Aches   Adhesive [tape] Rash   Ampicillin Rash, Other (See Comments)   REACTION: Rash and welts on her hands when she took it in the hospital   Latex Itching, Dermatitis, Rash, Other (See Comments)   REACTION: Patient had a reaction to tape after surgery and they told her she was  allergic to Latex   Penicillins Rash, Other (See Comments)   Has patient had a PCN reaction causing immediate rash, facial/tongue/throat swelling, SOB or lightheadedness with hypotension: No Has patient had a PCN reaction causing severe rash involving mucus membranes or skin necrosis: No Has patient had a PCN reaction that required hospitalization: Yes - in hospital receiving treatment Has patient had a PCN reaction occurring within the last 10 years: No If all of the above answers are "NO", then may proceed with Cephalosporin use.      Medication List    STOP taking these medications   losartan 50 MG tablet Commonly known as: COZAAR   metFORMIN 850 MG tablet Commonly known as: GLUCOPHAGE   metoCLOPramide 10 MG tablet Commonly known as: Reglan   tiZANidine 4 MG tablet Commonly known as: Zanaflex     TAKE these medications   acetaminophen 500 MG tablet Commonly known as: TYLENOL Take 1,000 mg by mouth every 6 (six) hours as needed for moderate pain or headache.   albuterol 108 (90 Base) MCG/ACT inhaler Commonly known as: VENTOLIN HFA Inhale 2 puffs into the lungs every 6 (six) hours as needed for wheezing or shortness of breath.   amLODipine 10 MG tablet Commonly known as: NORVASC TAKE 1 TABLET BY MOUTH EVERY DAY   aspirin 81 MG tablet Take 81 mg by mouth daily.   carvedilol 6.25 MG tablet Commonly known as: COREG  Take 1 tablet (6.25 mg total) by mouth 2 (two) times daily with a meal.   diclofenac Sodium 1 % Gel Commonly known as: VOLTAREN Apply 4 g topically 4 (four) times daily as needed.   fluticasone 50 MCG/ACT nasal spray Commonly known as: FLONASE Place 2 sprays into both nostrils daily. What changed:   when to take this  reasons to take this   furosemide 40 MG tablet Commonly known as: LASIX TAKE 1 TABLET (40 MG TOTAL) BY MOUTH DAILY AS NEEDED (LEG SWELLING).   glucose blood test strip Commonly known as: True Metrix Blood Glucose Test Use as  instructed   insulin NPH-regular Human (70-30) 100 UNIT/ML injection Inject 50-70 Units into the skin See admin instructions. 70 units in the morning and 50 units at night.   levothyroxine 100 MCG tablet Commonly known as: SYNTHROID Take 1 tablet (100 mcg total) by mouth daily.   pantoprazole 40 MG tablet Commonly known as: PROTONIX TAKE 1 TABLET BY MOUTH EVERY DAY   polyethylene glycol 17 g packet Commonly known as: MIRALAX / GLYCOLAX Take 17 g by mouth daily as needed for moderate constipation.   pregabalin 75 MG capsule Commonly known as: LYRICA TAKE 1 CAPSULE BY MOUTH TWICE A DAY   rosuvastatin 20 MG tablet Commonly known as: CRESTOR Take 1 tablet (20 mg total) by mouth daily at 6 PM.   True Metrix Air Glucose Meter w/Device Kit 1 kit by Does not apply route 3 (three) times daily.   True Metrix Level 2 Normal Soln Use as directed   TRUEplus Lancets 33G Misc Use as directed       Allergies  Allergen Reactions  . Dilaudid [Hydromorphone Hcl] Nausea And Vomiting  . Lipitor [Atorvastatin] Other (See Comments)    Body & Muscle Aches  . Adhesive [Tape] Rash  . Ampicillin Rash and Other (See Comments)    REACTION: Rash and welts on her hands when she took it in the hospital  . Latex Itching, Dermatitis, Rash and Other (See Comments)    REACTION: Patient had a reaction to tape after surgery and they told her she was allergic to Latex   . Penicillins Rash and Other (See Comments)    Has patient had a PCN reaction causing immediate rash, facial/tongue/throat swelling, SOB or lightheadedness with hypotension: No Has patient had a PCN reaction causing severe rash involving mucus membranes or skin necrosis: No Has patient had a PCN reaction that required hospitalization: Yes - in hospital receiving treatment Has patient had a PCN reaction occurring within the last 10 years: No If all of the above answers are "NO", then may proceed with Cephalosporin use.          The results of significant diagnostics from this hospitalization (including imaging, microbiology, ancillary and laboratory) are listed below for reference.     Microbiology: Recent Results (from the past 240 hour(s))  SARS Coronavirus 2 Ag (30 min TAT) - Nasal Swab (BD Veritor Kit)     Status: None   Collection Time: 04/15/19  3:10 PM   Specimen: Nasal Swab (BD Veritor Kit)  Result Value Ref Range Status   SARS Coronavirus 2 Ag NEGATIVE NEGATIVE Final    Comment: (NOTE) SARS-CoV-2 antigen NOT DETECTED.  Negative results are presumptive.  Negative results do not preclude SARS-CoV-2 infection and should not be used as the sole basis for treatment or other patient management decisions, including infection  control decisions, particularly in the presence of clinical signs and  symptoms  consistent with COVID-19, or in those who have been in contact with the virus.  Negative results must be combined with clinical observations, patient history, and epidemiological information. The expected result is Negative. Fact Sheet for Patients: PodPark.tn Fact Sheet for Healthcare Providers: GiftContent.is This test is not yet approved or cleared by the [place] FDA and  has been authorized for detection and/or diagnosis of SARS-CoV-2 by FDA under an Emergency Use Authorization (EUA).  This EUA will remain in effect (meaning this test can be used) for the duration of  the COVID-19 de claration under Section 564(b)(1) of the Act, 21 U.S.C. section 360bbb-3(b)(1), unless the authorization is terminated or revoked sooner. Performed at [facility], [address] [place], [place] [address]   Respiratory Panel by RT PCR (Flu A&B, Covid) - Nasopharyngeal Swab     Status: None   Collection Time: 04/15/19  7:45 PM   Specimen: Nasopharyngeal Swab  Result Value Ref Range Status   SARS Coronavirus 2 by RT PCR NEGATIVE  NEGATIVE Final    Comment: (NOTE) SARS-CoV-2 target nucleic acids are NOT DETECTED. The SARS-CoV-2 RNA is generally detectable in upper respiratoy specimens during the acute phase of infection. The lowest concentration of SARS-CoV-2 viral copies this assay can detect is 131 copies/mL. A negative result does not preclude SARS-Cov-2 infection and should not be used as the sole basis for treatment or other patient management decisions. A negative result may occur with  improper specimen collection/handling, submission of specimen other than nasopharyngeal swab, presence of viral mutation(s) within the areas targeted by this assay, and inadequate number of viral copies (<131 copies/mL). A negative result must be combined with clinical observations, patient history, and epidemiological information. The expected result is Negative. Fact Sheet for Patients:  PinkCheek.be Fact Sheet for Healthcare Providers:  GravelBags.it This test is not yet ap proved or cleared by the [place] FDA and  has been authorized for detection and/or diagnosis of SARS-CoV-2 by FDA under an Emergency Use Authorization (EUA). This EUA will remain  in effect (meaning this test can be used) for the duration of the COVID-19 declaration under Section 564(b)(1) of the Act, 21 U.S.C. section 360bbb-3(b)(1), unless the authorization is terminated or revoked sooner.    Influenza A by PCR NEGATIVE NEGATIVE Final   Influenza B by PCR NEGATIVE NEGATIVE Final    Comment: (NOTE) The Xpert Xpress SARS-CoV-2/FLU/RSV assay is intended as an aid in  the diagnosis of influenza from Nasopharyngeal swab specimens and  should not be used as a sole basis for treatment. Nasal washings and  aspirates are unacceptable for Xpert Xpress SARS-CoV-2/FLU/RSV  testing. Fact Sheet for Patients: PinkCheek.be Fact Sheet for Healthcare  Providers: GravelBags.it This test is not yet approved or cleared by the [place] FDA and  has been authorized for detection and/or diagnosis of SARS-CoV-2 by  FDA under an Emergency Use Authorization (EUA). This EUA will remain  in effect (meaning this test can be used) for the duration of the  Covid-19 declaration under Section 564(b)(1) of the Act, 21  U.S.C. section 360bbb-3(b)(1), unless the authorization is  terminated or revoked. Performed at [facility], [place] [address], [place], [place] [address]      Labs: BNP (last 3 results) No results for input(s): BNP in the last 8760 hours. Basic Metabolic Panel: Recent Labs  Lab 04/15/19 1415 04/16/19 2106 04/17/19 0227 04/18/19 0511 04/19/19 0458 04/20/19 0230  NA 140  --  140 141 139  138  K 4.6  --  4.1 4.5 4.3 4.4  CL 108  --  108 109 108 105  CO2 22  --  '24 25 25 24  ' GLUCOSE 153*  --  193* 183* 144* 223*  BUN 28*  --  21 24* 28* 30*  CREATININE 1.27* 1.75* 1.90* 2.18* 2.13* 2.07*  CALCIUM 10.0  --  9.8 9.5 9.4 9.4   Liver Function Tests: Recent Labs  Lab 04/17/19 0227  AST 23  ALT 18  ALKPHOS 50  BILITOT 0.5  PROT 6.3*  ALBUMIN 2.9*   No results for input(s): LIPASE, AMYLASE in the last 168 hours. No results for input(s): AMMONIA in the last 168 hours. CBC: Recent Labs  Lab 04/15/19 1415 04/16/19 2106 04/17/19 0227 04/18/19 0511 04/19/19 0458  WBC 10.2 8.4 7.5 7.1 7.2  HGB 11.7* 11.8* 10.3* 10.1* 10.0*  HCT 36.9 37.7 32.9* 32.2* 32.8*  MCV 93.2 92.2 91.9 92.3 94.8  PLT 271 282 238 268 264   Cardiac Enzymes: No results for input(s): CKTOTAL, CKMB, CKMBINDEX, TROPONINI in the last 168 hours. BNP: Invalid input(s): POCBNP CBG: Recent Labs  Lab 04/19/19 1630 04/19/19 2044 04/20/19 0036 04/20/19 0414 04/20/19 0803  GLUCAP 140* 176* 220* 202* 165*   D-Dimer No results for input(s): DDIMER in the last 72 hours. Hgb A1c No results for input(s): HGBA1C in  the last 72 hours. Lipid Profile No results for input(s): CHOL, HDL, LDLCALC, TRIG, CHOLHDL, LDLDIRECT in the last 72 hours. Thyroid function studies No results for input(s): TSH, T4TOTAL, T3FREE, THYROIDAB in the last 72 hours.  Invalid input(s): FREET3 Anemia work up No results for input(s): VITAMINB12, FOLATE, FERRITIN, TIBC, IRON, RETICCTPCT in the last 72 hours. Urinalysis    Component Value Date/Time   COLORURINE YELLOW 04/19/2019 1225   APPEARANCEUR CLEAR 04/19/2019 1225   LABSPEC 1.014 04/19/2019 1225   PHURINE 5.0 04/19/2019 1225   GLUCOSEU 50 (A) 04/19/2019 1225   HGBUR NEGATIVE 04/19/2019 1225   HGBUR negative 02/25/2010 1034   BILIRUBINUR NEGATIVE 04/19/2019 1225   KETONESUR NEGATIVE 04/19/2019 1225   PROTEINUR 100 (A) 04/19/2019 1225   UROBILINOGEN 0.2 09/13/2014 1725   NITRITE NEGATIVE 04/19/2019 1225   LEUKOCYTESUR NEGATIVE 04/19/2019 1225   Sepsis Labs Invalid input(s): PROCALCITONIN,  WBC,  LACTICIDVEN Microbiology Recent Results (from the past 240 hour(s))  SARS Coronavirus 2 Ag (30 min TAT) - Nasal Swab (BD Veritor Kit)     Status: None   Collection Time: 04/15/19  3:10 PM   Specimen: Nasal Swab (BD Veritor Kit)  Result Value Ref Range Status   SARS Coronavirus 2 Ag NEGATIVE NEGATIVE Final    Comment: (NOTE) SARS-CoV-2 antigen NOT DETECTED.  Negative results are presumptive.  Negative results do not preclude SARS-CoV-2 infection and should not be used as the sole basis for treatment or other patient management decisions, including infection  control decisions, particularly in the presence of clinical signs and  symptoms consistent with COVID-19, or in those who have been in contact with the virus.  Negative results must be combined with clinical observations, patient history, and epidemiological information. The expected result is Negative. Fact Sheet for Patients: PodPark.tn Fact Sheet for Healthcare  Providers: GiftContent.is This test is not yet approved or cleared by the [place] FDA and  has been authorized for detection and/or diagnosis of SARS-CoV-2 by FDA under an Emergency Use Authorization (EUA).  This EUA will remain in effect (meaning this test can be used) for the duration of  the COVID-19 de claration under Section 564(b)(1) of the Act, 21 U.S.C. section 360bbb-3(b)(1), unless the authorization is terminated or revoked sooner. Performed at [facility], [address] [place], [place] [address]   Respiratory Panel by RT PCR (Flu A&B, Covid) - Nasopharyngeal Swab     Status: None   Collection Time: 04/15/19  7:45 PM   Specimen: Nasopharyngeal Swab  Result Value Ref Range Status   SARS Coronavirus 2 by RT PCR NEGATIVE NEGATIVE Final    Comment: (NOTE) SARS-CoV-2 target nucleic acids are NOT DETECTED. The SARS-CoV-2 RNA is generally detectable in upper respiratoy specimens during the acute phase of infection. The lowest concentration of SARS-CoV-2 viral copies this assay can detect is 131 copies/mL. A negative result does not preclude SARS-Cov-2 infection and should not be used as the sole basis for treatment or other patient management decisions. A negative result may occur with  improper specimen collection/handling, submission of specimen other than nasopharyngeal swab, presence of viral mutation(s) within the areas targeted by this assay, and inadequate number of viral copies (<131 copies/mL). A negative result must be combined with clinical observations, patient history, and epidemiological information. The expected result is Negative. Fact Sheet for Patients:  PinkCheek.be Fact Sheet for Healthcare Providers:  GravelBags.it This test is not yet ap proved or cleared by the [place] FDA and  has been authorized for detection and/or diagnosis of SARS-CoV-2  by FDA under an Emergency Use Authorization (EUA). This EUA will remain  in effect (meaning this test can be used) for the duration of the COVID-19 declaration under Section 564(b)(1) of the Act, 21 U.S.C. section 360bbb-3(b)(1), unless the authorization is terminated or revoked sooner.    Influenza A by PCR NEGATIVE NEGATIVE Final   Influenza B by PCR NEGATIVE NEGATIVE Final    Comment: (NOTE) The Xpert Xpress SARS-CoV-2/FLU/RSV assay is intended as an aid in  the diagnosis of influenza from Nasopharyngeal swab specimens and  should not be used as a sole basis for treatment. Nasal washings and  aspirates are unacceptable for Xpert Xpress SARS-CoV-2/FLU/RSV  testing. Fact Sheet for Patients: PinkCheek.be Fact Sheet for Healthcare Providers: GravelBags.it This test is not yet approved or cleared by the [place] FDA and  has been authorized for detection and/or diagnosis of SARS-CoV-2 by  FDA under an Emergency Use Authorization (EUA). This EUA will remain  in effect (meaning this test can be used) for the duration of the  Covid-19 declaration under Section 564(b)(1) of the Act, 21  U.S.C. section 360bbb-3(b)(1), unless the authorization is  terminated or revoked. Performed at [facility], [place] [address], [place], [place] [address]      Time coordinating discharge: Over 30 minutes  SIGNED:   Desiree Hane, MD  [facility] 04/20/2019, 9:48 AM Pager   If 7PM-7AM, please contact night-coverage [email or web address] Password [email or web address]

## 2019-04-20 NOTE — Progress Notes (Addendum)
Unable to print AVS due to notification that patein has legal guardian that need to be notified prior d/c. Per primary nurse Tai and patient, patient does not have legal guardian.

## 2019-04-20 NOTE — Progress Notes (Signed)
RN reviewed the risk of remaining safe, by using the bed alarm at night.  Pt refused the bed alarm and assume the risks.

## 2019-04-21 LAB — RHEUMATOID FACTOR: Rheumatoid fact SerPl-aCnc: 10.1 IU/mL (ref 0.0–13.9)

## 2019-04-22 ENCOUNTER — Telehealth: Payer: Self-pay

## 2019-04-22 LAB — ANCA TITERS
Atypical P-ANCA titer: <1:20 {titer}
C-ANCA: <1:20 {titer}
P-ANCA: <1:20 {titer}

## 2019-04-22 LAB — CYCLIC CITRUL PEPTIDE ANTIBODY, IGG/IGA: CCP Antibodies IgG/IgA: 5 units (ref 0–19)

## 2019-04-22 NOTE — Telephone Encounter (Signed)
Transition Care Management Follow-up Telephone Call   Date discharged? 1.9.21   How have you been since you were released from the hospital? Patient states that she is feeling better, but feels tightness coming back.    Do you understand why you were in the hospital? Yes, chest tightness   Do you understand the discharge instructions? Yes, to follow up with Dr. Birdie Riddle   Where were you discharged to? Home, lives with husband   Items Reviewed:  Medications reviewed: yes  Allergies reviewed: yes  Dietary changes reviewed: Yes, heart healthy diet   Referrals reviewed: yes   Functional Questionnaire:   Activities of Daily Living (ADLs):   She states they are independent in the following: ambulation, bathing and hygiene, feeding, continence, grooming, toileting and dressing States they require assistance with the following: patient does not need any assistance   Any transportation issues/concerns?: no   Any patient concerns? Yes, patient was told not to take her metformin, but wanting to know when to restart. Instructed patient to reach out to Dr. Dorris Fetch office for further instructions, ordering provider.    Confirmed importance and date/time of follow-up visits scheduled yes  Provider Appointment booked with Annye Asa, MD 1.15.21 @ 11 AM  Confirmed with patient if condition begins to worsen call PCP or go to the ER.  Patient was given the office number and encouraged to call back with question or concerns.  : yes

## 2019-04-23 LAB — MPO/PR-3 (ANCA) ANTIBODIES
ANCA Proteinase 3: <3.5 U/mL (ref 0.0–3.5)
Myeloperoxidase Abs: <9 U/mL (ref 0.0–9.0)

## 2019-04-26 ENCOUNTER — Encounter: Payer: Self-pay | Admitting: Family Medicine

## 2019-04-26 ENCOUNTER — Other Ambulatory Visit: Payer: Self-pay

## 2019-04-26 ENCOUNTER — Ambulatory Visit (INDEPENDENT_AMBULATORY_CARE_PROVIDER_SITE_OTHER): Payer: Medicare HMO | Admitting: Family Medicine

## 2019-04-26 VITALS — BP 141/65 | HR 57 | Temp 97.7°F | Ht 64.0 in | Wt 212.0 lb

## 2019-04-26 DIAGNOSIS — I2721 Secondary pulmonary arterial hypertension: Secondary | ICD-10-CM

## 2019-04-26 DIAGNOSIS — R079 Chest pain, unspecified: Secondary | ICD-10-CM | POA: Diagnosis not present

## 2019-04-26 DIAGNOSIS — N179 Acute kidney failure, unspecified: Secondary | ICD-10-CM | POA: Diagnosis not present

## 2019-04-26 DIAGNOSIS — E1159 Type 2 diabetes mellitus with other circulatory complications: Secondary | ICD-10-CM

## 2019-04-26 DIAGNOSIS — B372 Candidiasis of skin and nail: Secondary | ICD-10-CM | POA: Diagnosis not present

## 2019-04-26 DIAGNOSIS — I10 Essential (primary) hypertension: Secondary | ICD-10-CM

## 2019-04-26 DIAGNOSIS — I712 Thoracic aortic aneurysm, without rupture: Secondary | ICD-10-CM | POA: Diagnosis not present

## 2019-04-26 DIAGNOSIS — I7121 Aneurysm of the ascending aorta, without rupture: Secondary | ICD-10-CM

## 2019-04-26 NOTE — Progress Notes (Signed)
Virtual Visit via Video   I connected with patient on 04/26/19 at 11:00 AM EST by a video enabled telemedicine application and verified that I am speaking with the correct person using two identifiers.  Location patient: Home Location provider: [facility], Office Persons participating in the virtual visit: Patient, Provider, [place] (Jess B)  I discussed the limitations of evaluation and management by telemedicine and the availability of in person appointments. The patient expressed understanding and agreed to proceed.  Subjective:   HPI:   Hospital F/U- pt was admitted 1/4-9 w/ CP and SOB.  She had negative CTA, TTE showed LVH, stress test was low risk.  Continues to have frequent chest tightness, 'I can feel it coming'.  Will have coughing spells w/ chest tightness.  Pt describes chest tightness as 'when you're too full and need to burp'.  Reports continued mild SOB.  Raising her voice triggers a coughing fit and coughing fit causes SOB.  Pt reports sxs 'aren't too bad' when she wakes but worsen throughout the day.  No relief w/ albuterol provided by hospital or OTC heartburn medication.  Pt reports she has had 2 episodes of this previously, 'and no body can tell me what it is'.  TTE showed LVH and there was suspicion for PAH.  Outpt pulmonary f/u was recommended w/ PFTs (not currently being done due to COVID).  4cm AAA- outpt vascular f/u needed  Cr 2.1 labs needed to ensure resolution.  Metformin was held (she has not notified Endo) and Losartan was held.  R sided neck spasm- has Voltaren gel.  Hospital stopped muscle relaxer  Rash under breast- 'I was told it was yeast or something'  Was given a powder in the hospital but this has not been effective in clearing rash.  Started using OTC Miconazole cream w/ some improvement.  ROS:   See pertinent positives and negatives per HPI.  Patient Active Problem List   Diagnosis Date Noted  . Chest wall pain 04/18/2019  . Neck pain  04/18/2019  . AKI (acute kidney injury) ([place])   . Ascending aortic aneurysm ([place]) 04/17/2019  . Dyspnea   . Hypoxia   . Nonspecific chest pain 04/15/2019  . Neuropathic pain of right flank 12/27/2018  . Dysphagia 12/27/2018  . Postsurgical hypothyroidism 06/11/2018  . Vitamin D deficiency 06/11/2018  . Multinodular goiter 06/11/2018  . Hypercalcemia 06/11/2018  . DM type 2 causing vascular disease ([place])   . PONV (postoperative nausea and vomiting)   . Peripheral neuropathy   . Irritable bowel syndrome   . Hypertension   . History of right mastectomy   . H/O hiatal hernia   . GERD (gastroesophageal reflux disease)   . Family history of anesthesia complication   . Diabetes mellitus type 2, uncontrolled, with complications ([place])   . Lower extremity edema   . Elevated d-dimer   . Elevated troponin   . Chronic constipation 06/23/2015  . Morbid obesity ([place]) 06/12/2015  . Chronic diastolic heart failure ([place]) 11/17/2014  . Herniated nucleus pulposus, L2-3 right 02/04/2014    Class: Acute  . Urinary retention 05/04/2012    Class: Temporary  . Spondylolisthesis of lumbar region 04/30/2012    Class: Chronic  . Lumbar stenosis with neurogenic claudication 04/30/2012    Class: Chronic  . Anginal pain ([place]) 03/19/2012  . SYNCOPE-CAROTID SINUS 09/16/2008  . Essential hypertension, benign 08/05/2008  . OSTEOARTHRITIS 08/23/2006  . DDD (degenerative disc disease), cervical 08/23/2006  . [place] DISEASE, LUMBAR 08/23/2006  .  Headache(784.0) 08/23/2006  . BREAST CANCER, HX OF 08/23/2006  . HYPERPARATHYROIDISM, HX OF 08/23/2006  . RENAL CALCULUS, HX OF 08/23/2006    Social History   Tobacco Use  . Smoking status: Never Smoker  . Smokeless tobacco: Never Used  Substance Use Topics  . Alcohol use: No    Current Outpatient Medications:  .  acetaminophen (TYLENOL) 500 MG tablet, Take 1,000 mg by mouth every 6 (six) hours as needed for moderate pain or headache., Disp: , Rfl:  .   albuterol (VENTOLIN HFA) 108 (90 Base) MCG/ACT inhaler, Inhale 2 puffs into the lungs every 6 (six) hours as needed for wheezing or shortness of breath., Disp: 8 g, Rfl: 0 .  amLODipine (NORVASC) 10 MG tablet, TAKE 1 TABLET BY MOUTH EVERY DAY, Disp: 90 tablet, Rfl: 3 .  aspirin 81 MG tablet, Take 81 mg by mouth daily., Disp: , Rfl:  .  Blood Glucose Calibration (TRUE METRIX LEVEL 2) Normal SOLN, Use as directed, Disp: 3 each, Rfl: 3 .  Blood Glucose Monitoring Suppl (TRUE METRIX AIR GLUCOSE METER) w/Device KIT, 1 kit by Does not apply route 3 (three) times daily., Disp: 1 kit, Rfl: 0 .  carvedilol (COREG) 6.25 MG tablet, Take 1 tablet (6.25 mg total) by mouth 2 (two) times daily with a meal., Disp: 60 tablet, Rfl: 0 .  diclofenac Sodium (VOLTAREN) 1 % GEL, Apply 4 g topically 4 (four) times daily as needed., Disp: 100 g, Rfl: 0 .  fluticasone (FLONASE) 50 MCG/ACT nasal spray, Place 2 sprays into both nostrils daily. (Patient taking differently: Place 2 sprays into both nostrils daily as needed for allergies. ), Disp: 48 g, Rfl: 3 .  furosemide (LASIX) 40 MG tablet, TAKE 1 TABLET (40 MG TOTAL) BY MOUTH DAILY AS NEEDED (LEG SWELLING)., Disp: 90 tablet, Rfl: 1 .  glucose blood (TRUE METRIX BLOOD GLUCOSE TEST) test strip, Use as instructed, Disp: 100 each, Rfl: 12 .  insulin NPH-regular Human (70-30) 100 UNIT/ML injection, Inject 50-70 Units into the skin See admin instructions. 70 units in the morning and 50 units at night., Disp: , Rfl:  .  levothyroxine (SYNTHROID) 100 MCG tablet, Take 1 tablet (100 mcg total) by mouth daily., Disp: 90 tablet, Rfl: 3 .  pantoprazole (PROTONIX) 40 MG tablet, TAKE 1 TABLET BY MOUTH EVERY DAY, Disp: 90 tablet, Rfl: 1 .  polyethylene glycol (MIRALAX / GLYCOLAX) packet, Take 17 g by mouth daily as needed for moderate constipation., Disp: , Rfl:  .  pregabalin (LYRICA) 75 MG capsule, TAKE 1 CAPSULE BY MOUTH TWICE A DAY, Disp: 60 capsule, Rfl: 3 .  rosuvastatin (CRESTOR) 20  MG tablet, Take 1 tablet (20 mg total) by mouth daily at 6 PM., Disp: 30 tablet, Rfl: 0 .  TRUEPLUS LANCETS 33G MISC, Use as directed, Disp: 300 each, Rfl: 3  Allergies  Allergen Reactions  . Dilaudid [Hydromorphone Hcl] Nausea And Vomiting  . Lipitor [Atorvastatin] Other (See Comments)    Body & Muscle Aches  . Adhesive [Tape] Rash  . Ampicillin Rash and Other (See Comments)    REACTION: Rash and welts on her hands when she took it in the hospital  . Latex Itching, Dermatitis, Rash and Other (See Comments)    REACTION: Patient had a reaction to tape after surgery and they told her she was allergic to Latex   . Penicillins Rash and Other (See Comments)    Has patient had a PCN reaction causing immediate rash, facial/tongue/throat swelling, SOB or lightheadedness  with hypotension: No Has patient had a PCN reaction causing severe rash involving mucus membranes or skin necrosis: No Has patient had a PCN reaction that required hospitalization: Yes - in hospital receiving treatment Has patient had a PCN reaction occurring within the last 10 years: No If all of the above answers are "NO", then may proceed with Cephalosporin use.     Objective:   BP (!) 141/65   Pulse (!) 57   Temp 97.7 F (36.5 C) (Tympanic)   Ht '5\' 4"'  (1.626 m)   Wt 212 lb (96.2 kg)   BMI 36.39 kg/m   AAOx3, NAD NCAT, EOMI No obvious CN deficits Coloring WNL Pt is able to speak clearly, coherently without shortness of breath or increased work of breathing.  Thought process is linear.  Mood is appropriate.   Assessment and Plan:   Ascending Aortic Aneurysm- new.  Noted on imaging done during her hospitalization.  Pt reports she was not told about this and is did not know what I was talking about.  Explained the imaging findings to her and told her this would need to be followed by Vascular going forward.  Pt expressed understanding and is in agreement w/ plan.   HTN- chronic problem.  Losartan is being held due  to recent AKI.  BP is mildly elevated but not bad considering med changes.  She was started on Coreg in place of ARB.  On Amlodipine as well.  No med changes at this time but will continue to follow.  AKI- new.  Currently Metformin and Losartan are being held.  Repeat BMP.  DM- chronic problem.  Metformin is currently being held due to recent AKI.  Will forward copy of today's note to Endo.  Yeast Dermatitis- encouraged pt to continue OTC Miconazole cream  PAH- pt had TEE findings suspicious for PAH.  Outpt referral to pulmonary recommended.  Pt again was unaware of this.  She is reluctant for another referral but ultimately agreeable.  CP- pt had complete cardiac workup and it was unrevealing for cause of CP.  Pt reports 'I can feel it coming' and 'like when you need to burp'.  Suspect there is a component of GER.  Start protonix BID and monitor for improvement.  Pt expressed understanding and is in agreement w/ plan.    Annye Asa, MD 04/26/2019

## 2019-04-26 NOTE — Progress Notes (Signed)
I have discussed the procedure for the virtual visit with the patient who has given consent to proceed with assessment and treatment.   Pt states she is still having pain in her upper chest, radiates into back. Still having SOB. Pt has a cough that comes and goes.   Davis Gourd, CMA

## 2019-04-29 MED ORDER — PANTOPRAZOLE SODIUM 40 MG PO TBEC: 40.0000 mg | DELAYED_RELEASE_TABLET | Freq: Two times a day (BID) | ORAL | 1 refills | Status: DC

## 2019-05-01 ENCOUNTER — Telehealth: Payer: Self-pay | Admitting: *Deleted

## 2019-05-01 NOTE — Telephone Encounter (Signed)
LVM to return call  Please schedule pt lab apt

## 2019-05-03 ENCOUNTER — Telehealth: Payer: Self-pay | Admitting: Family Medicine

## 2019-05-03 NOTE — Telephone Encounter (Signed)
If pt's pain is worse w/ movement, it sounds likely to be musculoskeletal.  Often times, the suspensory ligaments that hold our breasts onto the chest wall can become strained if they don't have good support.  Please make sure pt is wearing a good supportive bra, she can carefully use a heating pad or ice pack (whichever feels better- but neither one directly on the skin)

## 2019-05-03 NOTE — Telephone Encounter (Signed)
Please advise 

## 2019-05-03 NOTE — Telephone Encounter (Signed)
Patient notified of PCP recommendations and is agreement and expresses an understanding.  

## 2019-05-03 NOTE — Telephone Encounter (Signed)
Pt hospitalized recently with chest pain, says she has discussed with Dr. Birdie Riddle. She had a little chest pain reoccur last night and wanted to ask if there is anything she should take or do to alleviate the pain when it occurs.  Per pt, this is not heart related and she did take an additional baby aspirin. She cannot relate the pain to food or activity.  Looks like she is seeing [facility] on 2/5, I mentioned this to her as she does not listen to her voicemails.  Pt [phone]

## 2019-05-03 NOTE — Telephone Encounter (Signed)
Called and spoke with pt. She advised that She began having pain before 6pm last night and it lasted until she went to bed at 10:30. She took a baby aspirin and it seemed to alleviate it a bit. States that pain is located on the right side at the Breast and radiates into the back. Pt advised it is a dull, constant pain at at its worst was an 8/10. States that she is still having it today but it is not as severe because her husband is making her stay still.

## 2019-05-03 NOTE — Telephone Encounter (Signed)
I need to know more about what she means by 'a little chest pain' to give any advice

## 2019-05-14 ENCOUNTER — Other Ambulatory Visit: Payer: Self-pay | Admitting: Family Medicine

## 2019-05-16 ENCOUNTER — Ambulatory Visit: Payer: Medicare HMO | Admitting: Critical Care Medicine

## 2019-05-16 ENCOUNTER — Other Ambulatory Visit: Payer: Self-pay

## 2019-05-16 ENCOUNTER — Encounter: Payer: Self-pay | Admitting: Critical Care Medicine

## 2019-05-16 VITALS — BP 138/78 | HR 67 | Temp 98.0°F | Ht 64.0 in | Wt 215.4 lb

## 2019-05-16 DIAGNOSIS — R0683 Snoring: Secondary | ICD-10-CM | POA: Diagnosis not present

## 2019-05-16 DIAGNOSIS — R9389 Abnormal findings on diagnostic imaging of other specified body structures: Secondary | ICD-10-CM

## 2019-05-16 DIAGNOSIS — R0609 Other forms of dyspnea: Secondary | ICD-10-CM

## 2019-05-16 DIAGNOSIS — R0600 Dyspnea, unspecified: Secondary | ICD-10-CM

## 2019-05-16 DIAGNOSIS — R062 Wheezing: Secondary | ICD-10-CM

## 2019-05-16 DIAGNOSIS — Z23 Encounter for immunization: Secondary | ICD-10-CM

## 2019-05-16 DIAGNOSIS — K219 Gastro-esophageal reflux disease without esophagitis: Secondary | ICD-10-CM

## 2019-05-16 NOTE — Progress Notes (Signed)
Synopsis: Referred in February 2021 for pulmonary hypertension by [name], MD.  Subjective:   PATIENT ID: [name]: female DOB: [birth year], MRN: [id number]  Chief Complaint  Patient presents with  . Consult    Patient is here to establish care for Pulmonary hypertension. Patient states that she has been having wheezing and chest pain that started about 3 months ago. Patient has shortness of breath with exertion. Patient has occasional dry cough.     Mrs. Torrance is a 69 year old woman who presents for evaluation of atypical chest pain and concern for pulmonary hypertension.  She recently was hospitalized from 04/15/19-04/20/19 after having episode of wheezing and severe shortness of breath associated with chest pain.  She had a negative cardiac evaluation at that time, but had dilated pulmonary artery on CT scan.  Echocardiogram was unable to evaluate PA pressures.  She has had ongoing chest pain, on both sides of her sternum, reproducible with palpation.  It can be worse with activity, but sometimes occurs when she is lying flat.  It can feel like she has an elephant sitting on her chest, radiating to her back.  She develops coughing and wheezing after the pain starts.  She occasionally develops hoarseness.  Use of a heating pad and tylenol have been helpful but tums and muscle relaxers have not been helpful.  Albuterol has not helped.  Since starting twice daily PPI, she has not noticed a change in her symptoms.  She eats dinner several hours prior to going to bed, and overall has been eating less recently due to reduced appetite.  She admits to chronic pain and a sticking sensation when swallowing.  She has to drink when eating food to help it go down.  She has never been evaluated by GI in the past.  She is a never smoker.  There is no family history or personal history of lung disease. When her pain is not present she has baseline activity limitations due to chronic  back pain.  She never has pulmonary symptoms without the chest pain.  She was hospitalized CT demonstrated a large pulmonary artery, concerning for pulmonary hypertension.  Cardiology recommended evaluation for sleep apnea.  She snores at night and does not wake up well rested.  She is tired throughout the day.  Stop Bang score = 5.  Up-to-date on seasonal flu and pneumococcal 23 vaccine.     Past Medical History:  Diagnosis Date  . Anginal pain ([place]) 03/19/2012   saw Dr. Cathie Olden .. she thinks its more related to stomach issues  . Arthritis    "all over; mostly in my back" (03/20/2012)  . Breast cancer ([place])    "right" (03/20/2012)  . Cardiomyopathy   . Chest pain 06/2005   Hospitalized, dystolic dysfunction  . Chronic lower back pain    "have 2 herniated discs; going to have to have a fusion" (03/20/2012)  . CKD (chronic kidney disease), stage III   . Family history of anesthesia complication    "father has nausea and vomiting"  . Gallstones   . GERD (gastroesophageal reflux disease)   . H/O hiatal hernia   . History of kidney stones   . History of right mastectomy   . Hypertension    "patient states never had HTN, takes Hyzaar for heart  . Hypothyroidism   . Irritable bowel syndrome   . Migraines    "it's been a long time since I've had one" (03/20/2012)  . Osteoarthritis   .  Peripheral neuropathy   . PONV (postoperative nausea and vomiting)   . Sciatica   . Type II diabetes mellitus (HCC)      Family History  Problem Relation Age of Onset  . Hypertension Mother   . Diabetes Father   . Hypertension Father   . Hypertension Sister   . Hypertension Brother   . Hypertension Brother   . Diabetes Brother   . Diabetes Brother   . Diabetes Other        Family Hx. of Diabetes  . Hypertension Other        Family History of HTN  . Deep vein thrombosis Daughter   . Breast cancer Maternal Aunt      Past Surgical History:  Procedure Laterality Date  . ABDOMINAL  HYSTERECTOMY  1993  . adenosin cardiolite  07/2005   (+) wall motion abnormality  . AXILLARY LYMPH NODE DISSECTION  2003   squamous cell cancer  . BACK SURGERY    . CARDIAC CATHETERIZATION  ? 2005 / 2007  . CHOLECYSTECTOMY N/A 01/24/2018   Procedure: LAPAROSCOPIC CHOLECYSTECTOMY;  Surgeon: Erroll Luna, MD;  Location: [facility];  Service: General;  Laterality: N/A;  . CT abdomen and pelvis  02/2010   Same  . ESOPHAGOGASTRODUODENOSCOPY  2005   GERD  . KNEE ARTHROSCOPY  1990's   "right" (03/20/2012)  . LUMBAR FUSION N/A 08/22/2014   Procedure: Right L2-3 and right L1-2 transforaminal lumbar interbody fusion with pedicle screws, rods, sleeves, cages, local bone graft, Vivigen, cancellous chips;  Surgeon: Jessy Oto, MD;  Location: [facility];  Service: Orthopedics;  Laterality: N/A;  . LUMBAR LAMINECTOMY/DECOMPRESSION MICRODISCECTOMY N/A 02/04/2014   Procedure: RIGHT L2-3 MICRODISCECTOMY ;  Surgeon: Jessy Oto, MD;  Location: [facility];  Service: Orthopedics;  Laterality: N/A;  . MASTECTOMY Right   . MASTECTOMY WITH AXILLARY LYMPH NODE DISSECTION  12/2003   "right" (03/20/2012)  . POSTERIOR CERVICAL FUSION/FORAMINOTOMY  2001  . SHOULDER ARTHROSCOPY W/ ROTATOR CUFF REPAIR  2003   Left  . THYROIDECTOMY  1977   Right  . TOTAL KNEE ARTHROPLASTY  2000   Right  . [place] of abdomen  07/2005   Fatty liver / kidney stones    Social History   Socioeconomic History  . Marital status: Married    Spouse name: Not on file  . Number of children: 1  . Years of education: Not on file  . Highest education level: Not on file  Occupational History  . Occupation: Retired since 2003  Tobacco Use  . Smoking status: Never Smoker  . Smokeless tobacco: Never Used  Substance and Sexual Activity  . Alcohol use: No  . Drug use: No  . Sexual activity: Not on file  Other Topics Concern  . Not on file  Social History Narrative  . Not on file   Social Determinants of Health   Financial Resource Strain:     . Difficulty of Paying Living Expenses: Not on file  Food Insecurity:   . Worried About Charity fundraiser in the Last Year: Not on file  . Ran Out of Food in the Last Year: Not on file  Transportation Needs:   . Lack of Transportation (Medical): Not on file  . Lack of Transportation (Non-Medical): Not on file  Physical Activity:   . Days of Exercise per Week: Not on file  . Minutes of Exercise per Session: Not on file  Stress:   . Feeling of Stress : Not on file  Social Connections:   . Frequency of Communication with Friends and Family: Not on file  . Frequency of Social Gatherings with Friends and Family: Not on file  . Attends Religious Services: Not on file  . Active Member of Clubs or Organizations: Not on file  . Attends Archivist Meetings: Not on file  . Marital Status: Not on file  Intimate Partner Violence:   . Fear of Current or Ex-Partner: Not on file  . Emotionally Abused: Not on file  . Physically Abused: Not on file  . Sexually Abused: Not on file     Allergies  Allergen Reactions  . Dilaudid [Hydromorphone Hcl] Nausea And Vomiting  . Lipitor [Atorvastatin] Other (See Comments)    Body & Muscle Aches  . Adhesive [Tape] Rash  . Ampicillin Rash and Other (See Comments)    REACTION: Rash and welts on her hands when she took it in the hospital  . Latex Itching, Dermatitis, Rash and Other (See Comments)    REACTION: Patient had a reaction to tape after surgery and they told her she was allergic to Latex   . Penicillins Rash and Other (See Comments)    Has patient had a PCN reaction causing immediate rash, facial/tongue/throat swelling, SOB or lightheadedness with hypotension: No Has patient had a PCN reaction causing severe rash involving mucus membranes or skin necrosis: No Has patient had a PCN reaction that required hospitalization: Yes - in hospital receiving treatment Has patient had a PCN reaction occurring within the last 10 years: No If all of  the above answers are "NO", then may proceed with Cephalosporin use.      Immunization History  Administered Date(s) Administered  . Influenza Split 01/31/2011  . Influenza Whole 02/23/2007  . Influenza, High Dose Seasonal PF 01/10/2017  . Influenza,inj,Quad PF,6+ Mos 01/07/2014, 02/09/2016, 01/01/2018, 12/03/2018  . Influenza-Unspecified 01/10/2012  . Pneumococcal Polysaccharide-23 08/25/2014  . Tdap 04/20/2011    Outpatient Medications Prior to Visit  Medication Sig Dispense Refill  . acetaminophen (TYLENOL) 500 MG tablet Take 1,000 mg by mouth every 6 (six) hours as needed for moderate pain or headache.    . albuterol (VENTOLIN HFA) 108 (90 Base) MCG/ACT inhaler Inhale 2 puffs into the lungs every 6 (six) hours as needed for wheezing or shortness of breath. 8 g 0  . amLODipine (NORVASC) 10 MG tablet TAKE 1 TABLET BY MOUTH EVERY DAY 90 tablet 3  . aspirin 81 MG tablet Take 81 mg by mouth daily.    . Blood Glucose Calibration (TRUE METRIX LEVEL 2) Normal SOLN Use as directed 3 each 3  . Blood Glucose Monitoring Suppl (TRUE METRIX AIR GLUCOSE METER) w/Device KIT 1 kit by Does not apply route 3 (three) times daily. 1 kit 0  . carvedilol (COREG) 6.25 MG tablet TAKE 1 TABLET (6.25 MG TOTAL) BY MOUTH 2 (TWO) TIMES DAILY WITH A MEAL. 180 tablet 1  . diclofenac Sodium (VOLTAREN) 1 % GEL Apply 4 g topically 4 (four) times daily as needed. 100 g 0  . fluticasone (FLONASE) 50 MCG/ACT nasal spray Place 2 sprays into both nostrils daily. (Patient taking differently: Place 2 sprays into both nostrils daily as needed for allergies. ) 48 g 3  . furosemide (LASIX) 40 MG tablet TAKE 1 TABLET (40 MG TOTAL) BY MOUTH DAILY AS NEEDED (LEG SWELLING). 90 tablet 1  . glucose blood (TRUE METRIX BLOOD GLUCOSE TEST) test strip Use as instructed 100 each 12  . insulin NPH-regular Human (70-30) 100  UNIT/ML injection Inject 50-70 Units into the skin See admin instructions. 70 units in the morning and 50 units at  night.    . levothyroxine (SYNTHROID) 100 MCG tablet Take 1 tablet (100 mcg total) by mouth daily. 90 tablet 3  . pantoprazole (PROTONIX) 40 MG tablet Take 1 tablet (40 mg total) by mouth 2 (two) times daily. 180 tablet 1  . polyethylene glycol (MIRALAX / GLYCOLAX) packet Take 17 g by mouth daily as needed for moderate constipation.    . pregabalin (LYRICA) 75 MG capsule TAKE 1 CAPSULE BY MOUTH TWICE A DAY 60 capsule 3  . rosuvastatin (CRESTOR) 20 MG tablet Take 1 tablet (20 mg total) by mouth daily at 6 PM. 30 tablet 0  . TRUEPLUS LANCETS 33G MISC Use as directed 300 each 3   No facility-administered medications prior to visit.    Review of Systems  Constitutional:       Weight gain, poor appetite  Respiratory: Positive for cough, shortness of breath and wheezing.   Cardiovascular: Positive for chest pain and leg swelling.  Gastrointestinal: Positive for nausea.       Indigestion, sticking feeling when swallowing  Musculoskeletal: Positive for back pain.     Objective:   Vitals:   05/16/19 0940  BP: 138/78  Pulse: 67  Temp: 98 F (36.7 C)  TempSrc: Temporal  SpO2: 96%  Weight: 215 lb 6.4 oz (97.7 kg)  Height: '5\' 4"'  (1.626 m)   96% on   RA BMI Readings from Last 3 Encounters:  05/16/19 36.97 kg/m  04/26/19 36.39 kg/m  04/20/19 36.58 kg/m   Wt Readings from Last 3 Encounters:  05/16/19 215 lb 6.4 oz (97.7 kg)  04/26/19 212 lb (96.2 kg)  04/20/19 219 lb 12.8 oz (99.7 kg)    Physical Exam Vitals reviewed.  Constitutional:      Appearance: She is obese. She is not ill-appearing.  HENT:     Head: Normocephalic and atraumatic.  Eyes:     General: No scleral icterus. Cardiovascular:     Rate and Rhythm: Normal rate and regular rhythm.     Heart sounds: No murmur.  Pulmonary:     Comments: Breathing comfortably on room air, no conversational dyspnea.  Clear to auscultation bilaterally. Abdominal:     General: There is no distension.     Palpations: Abdomen is  soft.     Tenderness: There is no abdominal tenderness.  Musculoskeletal:        General: Swelling present.     Cervical back: Neck supple.  Lymphadenopathy:     Cervical: No cervical adenopathy.  Skin:    General: Skin is warm and dry.     Comments: Venous stasis changes  Neurological:     General: No focal deficit present.     Mental Status: She is alert.     Coordination: Coordination normal.  Psychiatric:        Mood and Affect: Mood normal.        Behavior: Behavior normal.      CBC    Component Value Date/Time   WBC 7.2 04/19/2019 0458   RBC 3.46 (L) 04/19/2019 0458   HGB 10.0 (L) 04/19/2019 0458   HGB 13.8 06/11/2010 1001   HCT 32.8 (L) 04/19/2019 0458   HCT 41.9 06/11/2010 1001   PLT 264 04/19/2019 0458   PLT 234 06/11/2010 1001   MCV 94.8 04/19/2019 0458   MCV 88.6 06/11/2010 1001   MCH 28.9 04/19/2019 0458  MCHC 30.5 04/19/2019 0458   RDW 14.4 04/19/2019 0458   RDW 14.1 06/11/2010 1001   LYMPHSABS 2.3 01/19/2018 1123   LYMPHSABS 2.3 06/11/2010 1001   MONOABS 0.5 01/19/2018 1123   MONOABS 0.5 06/11/2010 1001   EOSABS 0.3 01/19/2018 1123   EOSABS 0.2 06/11/2010 1001   EOSABS 0.2 08/14/2009 1400   BASOSABS 0.1 01/19/2018 1123   BASOSABS 0.1 06/11/2010 1001    CHEMISTRY No results for input(s): NA, K, CL, CO2, GLUCOSE, BUN, CREATININE, CALCIUM, MG, PHOS in the last 168 hours. CrCl cannot be calculated (Patient's most recent lab result is older than the maximum 21 days allowed.).   Chest Imaging- films reviewed: CTA chest 04/15/2019- mosaicism throughout the lungs, excessive airway collapse from the upper trachea to the mainstem bronchi bilaterally.  Dilated esophagus.  A sending aortic aneurysm, very large pulmonary artery.  Dilated pulmonary veins.  No PE.  No significant mediastinal or hilar adenopathy.  Pulmonary Functions Testing Results: No flowsheet data found.    Echocardiogram 04/17/2019: LVEF 55 to 60%, severe LVH, asymmetric hypertrophy with  thickened basilar septum.  Indeterminate diastolic function.  Normal LA, mildly enlarged RV with normal function, normal RA.  Mild MR, trivial PR.  Normal PASP.  Mild dilation of the ascending aorta.  NM SPECT scan 04/17/2019: Normal myocardial perfusion, low risk study.  Baseline hypertension with hypertensive response to exercise.  Reach 70% heart rate.  No ST changes during stress, frequent PVCs.    Assessment & Plan:     ICD-10-CM   1. Snoring  R06.83 PSG SLEEP STUDY  2. Wheezing  R06.2 Pulmonary function test    Ambulatory referral to Gastroenterology    PSG SLEEP STUDY  3. Abnormal CT of the chest  R93.89 Pulmonary function test    PSG SLEEP STUDY  4. Gastroesophageal reflux disease, unspecified whether esophagitis present  K21.9      Abnormal CT demonstrating enlarged PA concerning for pulmonary hypertension.  At risk for WHO group II hypertension due to left-sided heart disease, but lack of LA dilation suggest this is less likely.  Sleep apnea would predispose to WHO group III pulmonary hypertension. -Split-night polysomnogram -PFT -Would like to identify the burden of symptomatology from contributing musculoskeletal pain and GI symptoms prior to initiating a more invasive work-up for possible pulmonary hypertension as pain control is her priority.  Snoring, concern for OSA.  Stop bang score equals 5, correlating to high risk for sleep apnea -Split-night polysomnogram  GERD, dysphagia -Referral to GI for consideration for EGD. -Continue Protonix twice daily  Wheezing, low suspicion for obstructive lung disease.  Mosaicism on CT could be consistent with air trapping. -PFTs   RTC in 2 months.    Current Outpatient Medications:  .  acetaminophen (TYLENOL) 500 MG tablet, Take 1,000 mg by mouth every 6 (six) hours as needed for moderate pain or headache., Disp: , Rfl:  .  albuterol (VENTOLIN HFA) 108 (90 Base) MCG/ACT inhaler, Inhale 2 puffs into the lungs every 6 (six) hours  as needed for wheezing or shortness of breath., Disp: 8 g, Rfl: 0 .  amLODipine (NORVASC) 10 MG tablet, TAKE 1 TABLET BY MOUTH EVERY DAY, Disp: 90 tablet, Rfl: 3 .  aspirin 81 MG tablet, Take 81 mg by mouth daily., Disp: , Rfl:  .  Blood Glucose Calibration (TRUE METRIX LEVEL 2) Normal SOLN, Use as directed, Disp: 3 each, Rfl: 3 .  Blood Glucose Monitoring Suppl (TRUE METRIX AIR GLUCOSE METER) w/Device KIT, 1 kit by  Does not apply route 3 (three) times daily., Disp: 1 kit, Rfl: 0 .  carvedilol (COREG) 6.25 MG tablet, TAKE 1 TABLET (6.25 MG TOTAL) BY MOUTH 2 (TWO) TIMES DAILY WITH A MEAL., Disp: 180 tablet, Rfl: 1 .  diclofenac Sodium (VOLTAREN) 1 % GEL, Apply 4 g topically 4 (four) times daily as needed., Disp: 100 g, Rfl: 0 .  fluticasone (FLONASE) 50 MCG/ACT nasal spray, Place 2 sprays into both nostrils daily. (Patient taking differently: Place 2 sprays into both nostrils daily as needed for allergies. ), Disp: 48 g, Rfl: 3 .  furosemide (LASIX) 40 MG tablet, TAKE 1 TABLET (40 MG TOTAL) BY MOUTH DAILY AS NEEDED (LEG SWELLING)., Disp: 90 tablet, Rfl: 1 .  glucose blood (TRUE METRIX BLOOD GLUCOSE TEST) test strip, Use as instructed, Disp: 100 each, Rfl: 12 .  insulin NPH-regular Human (70-30) 100 UNIT/ML injection, Inject 50-70 Units into the skin See admin instructions. 70 units in the morning and 50 units at night., Disp: , Rfl:  .  levothyroxine (SYNTHROID) 100 MCG tablet, Take 1 tablet (100 mcg total) by mouth daily., Disp: 90 tablet, Rfl: 3 .  pantoprazole (PROTONIX) 40 MG tablet, Take 1 tablet (40 mg total) by mouth 2 (two) times daily., Disp: 180 tablet, Rfl: 1 .  polyethylene glycol (MIRALAX / GLYCOLAX) packet, Take 17 g by mouth daily as needed for moderate constipation., Disp: , Rfl:  .  pregabalin (LYRICA) 75 MG capsule, TAKE 1 CAPSULE BY MOUTH TWICE A DAY, Disp: 60 capsule, Rfl: 3 .  rosuvastatin (CRESTOR) 20 MG tablet, Take 1 tablet (20 mg total) by mouth daily at 6 PM., Disp: 30 tablet,  Rfl: 0 .  TRUEPLUS LANCETS 33G MISC, Use as directed, Disp: 300 each, Rfl: 3    Julian Hy, DO [facility] 05/16/2019 10:13 AM

## 2019-05-16 NOTE — Patient Instructions (Addendum)
Thank you for visiting Dr. Carlis Abbott at [facility]. We recommend the following: Orders Placed This Encounter  Procedures  . Ambulatory referral to Gastroenterology  . Pulmonary function test  . PSG SLEEP STUDY   Orders Placed This Encounter  Procedures  . Ambulatory referral to Gastroenterology    Referral Priority:   Routine    Referral Type:   Consultation    Referral Reason:   Specialty Services Required    Number of Visits Requested:   1  . Pulmonary function test    Standing Status:   Future    Standing Expiration Date:   05/15/2020    Order Specific Question:   Where should this test be performed?    Answer:   [facility]    Order Specific Question:   Full PFT: includes the following: basic spirometry, spirometry pre & post bronchodilator, diffusion capacity (DLCO), lung volumes    Answer:   Full PFT  . PSG SLEEP STUDY    Standing Status:   Future    Standing Expiration Date:   05/15/2020    Order Specific Question:   Where should this test be performed:    Answer:   [facility]    No orders of the defined types were placed in this encounter.   Return in about 2 months (around 07/14/2019). after PFTs    Please do your part to reduce the spread of COVID-19.

## 2019-05-17 ENCOUNTER — Encounter: Payer: Self-pay | Admitting: Thoracic Surgery (Cardiothoracic Vascular Surgery)

## 2019-05-17 ENCOUNTER — Institutional Professional Consult (permissible substitution): Payer: Medicare HMO | Admitting: Thoracic Surgery (Cardiothoracic Vascular Surgery)

## 2019-05-17 VITALS — BP 150/78 | HR 69 | Temp 96.7°F | Resp 20 | Ht 64.0 in | Wt 215.0 lb

## 2019-05-17 DIAGNOSIS — I712 Thoracic aortic aneurysm, without rupture: Secondary | ICD-10-CM

## 2019-05-17 DIAGNOSIS — I7121 Aneurysm of the ascending aorta, without rupture: Secondary | ICD-10-CM

## 2019-05-17 NOTE — Progress Notes (Signed)
[address]       [place] [address]             [phone]        Deshana Richard Littman  Medical Record #[id number] Date of Birth: 17-Jul-1950  Referring: [name], MD Primary Care: [name], MD Primary Cardiologist:Will Meredith Leeds, MD  Chief Complaint:    Chief Complaint  Patient presents with  . Thoracic Aortic Aneurysm    Surgical eval, CTA Chest 04/15/19, ECHO 04/17/19    History of Present Illness:     10 female referred for surgical evaluation of a 4 cm aortic aneurysm.  This was found incidentally on cross-sectional imaging which she was admitted for atypical chest pain.  Of note she also had dilation of the pulmonary artery concerning pulmonary hypertension.  In regards to her symptoms, she states that she has some crushing chest pain that radiates to her back while at rest.  This occurred when the incident that prompted her to seek medical attention.  Otherwise she only complains of a dry cough.  She had a nuclear stress test at the last hospitalization that was negative.  She does admit to a positive history of aortic dissection in her 19 year old nephew recently died in [place].   Past Medical and Surgical History: Previous Chest Surgery: No Previous Chest Radiation: No   Past Medical History:  Diagnosis Date  . Anginal pain ([place]) 03/19/2012   saw Dr. Cathie Olden .. she thinks its more related to stomach issues  . Arthritis    "all over; mostly in my back" (03/20/2012)  . Breast cancer ([place])    "right" (03/20/2012)  . Cardiomyopathy   . Chest pain 06/2005   Hospitalized, dystolic dysfunction  . Chronic lower back pain    "have 2 herniated discs; going to have to have a fusion" (03/20/2012)  . CKD (chronic kidney disease), stage III   . Family history of anesthesia complication    "father has nausea and vomiting"  . Gallstones   . GERD (gastroesophageal reflux disease)   . H/O hiatal hernia   .  History of kidney stones   . History of right mastectomy   . Hypertension    "patient states never had HTN, takes Hyzaar for heart  . Hypothyroidism   . Irritable bowel syndrome   . Migraines    "it's been a long time since I've had one" (03/20/2012)  . Osteoarthritis   . Peripheral neuropathy   . PONV (postoperative nausea and vomiting)   . Sciatica   . Type II diabetes mellitus ([place])     Past Surgical History:  Procedure Laterality Date  . ABDOMINAL HYSTERECTOMY  1993  . adenosin cardiolite  07/2005   (+) wall motion abnormality  . AXILLARY LYMPH NODE DISSECTION  2003   squamous cell cancer  . BACK SURGERY    . CARDIAC CATHETERIZATION  ? 2005 / 2007  . CHOLECYSTECTOMY N/A 01/24/2018   Procedure: LAPAROSCOPIC CHOLECYSTECTOMY;  Surgeon: Erroll Luna, MD;  Location: [facility];  Service: General;  Laterality: N/A;  . CT abdomen and pelvis  02/2010   Same  . ESOPHAGOGASTRODUODENOSCOPY  2005   GERD  . KNEE ARTHROSCOPY  1990's   "right" (03/20/2012)  . LUMBAR FUSION N/A 08/22/2014   Procedure: Right L2-3 and right L1-2 transforaminal lumbar interbody fusion with pedicle screws, rods, sleeves, cages, local bone graft, Vivigen, cancellous chips;  Surgeon: Jessy Oto, MD;  Location: [facility]  OR;  Service: Orthopedics;  Laterality: N/A;  . LUMBAR LAMINECTOMY/DECOMPRESSION MICRODISCECTOMY N/A 02/04/2014   Procedure: RIGHT L2-3 MICRODISCECTOMY ;  Surgeon: Jessy Oto, MD;  Location: [facility];  Service: Orthopedics;  Laterality: N/A;  . MASTECTOMY Right   . MASTECTOMY WITH AXILLARY LYMPH NODE DISSECTION  12/2003   "right" (03/20/2012)  . POSTERIOR CERVICAL FUSION/FORAMINOTOMY  2001  . SHOULDER ARTHROSCOPY W/ ROTATOR CUFF REPAIR  2003   Left  . THYROIDECTOMY  1977   Right  . TOTAL KNEE ARTHROPLASTY  2000   Right  . [place] of abdomen  07/2005   Fatty liver / kidney stones     Social History   Tobacco Use  Smoking Status Never Smoker  Smokeless Tobacco Never Used    Social History    Substance and Sexual Activity  Alcohol Use No     Allergies  Allergen Reactions  . Dilaudid [Hydromorphone Hcl] Nausea And Vomiting  . Lipitor [Atorvastatin] Other (See Comments)    Body & Muscle Aches  . Adhesive [Tape] Rash  . Ampicillin Rash and Other (See Comments)    REACTION: Rash and welts on her hands when she took it in the hospital  . Latex Itching, Dermatitis, Rash and Other (See Comments)    REACTION: Patient had a reaction to tape after surgery and they told her she was allergic to Latex   . Penicillins Rash and Other (See Comments)    Has patient had a PCN reaction causing immediate rash, facial/tongue/throat swelling, SOB or lightheadedness with hypotension: No Has patient had a PCN reaction causing severe rash involving mucus membranes or skin necrosis: No Has patient had a PCN reaction that required hospitalization: Yes - in hospital receiving treatment Has patient had a PCN reaction occurring within the last 10 years: No If all of the above answers are "NO", then may proceed with Cephalosporin use.       Current Outpatient Medications  Medication Sig Dispense Refill  . acetaminophen (TYLENOL) 500 MG tablet Take 1,000 mg by mouth every 6 (six) hours as needed for moderate pain or headache.    . albuterol (VENTOLIN HFA) 108 (90 Base) MCG/ACT inhaler Inhale 2 puffs into the lungs every 6 (six) hours as needed for wheezing or shortness of breath. 8 g 0  . amLODipine (NORVASC) 10 MG tablet TAKE 1 TABLET BY MOUTH EVERY DAY 90 tablet 3  . aspirin 81 MG tablet Take 81 mg by mouth daily.    . Blood Glucose Calibration (TRUE METRIX LEVEL 2) Normal SOLN Use as directed 3 each 3  . Blood Glucose Monitoring Suppl (TRUE METRIX AIR GLUCOSE METER) w/Device KIT 1 kit by Does not apply route 3 (three) times daily. 1 kit 0  . carvedilol (COREG) 6.25 MG tablet TAKE 1 TABLET (6.25 MG TOTAL) BY MOUTH 2 (TWO) TIMES DAILY WITH A MEAL. 180 tablet 1  . diclofenac Sodium (VOLTAREN) 1 %  GEL Apply 4 g topically 4 (four) times daily as needed. 100 g 0  . fluticasone (FLONASE) 50 MCG/ACT nasal spray Place 2 sprays into both nostrils daily. (Patient taking differently: Place 2 sprays into both nostrils daily as needed for allergies. ) 48 g 3  . furosemide (LASIX) 40 MG tablet TAKE 1 TABLET (40 MG TOTAL) BY MOUTH DAILY AS NEEDED (LEG SWELLING). 90 tablet 1  . glucose blood (TRUE METRIX BLOOD GLUCOSE TEST) test strip Use as instructed 100 each 12  . insulin NPH-regular Human (70-30) 100 UNIT/ML injection Inject 50-70 Units into  the skin See admin instructions. 70 units in the morning and 50 units at night.    . levothyroxine (SYNTHROID) 100 MCG tablet Take 1 tablet (100 mcg total) by mouth daily. 90 tablet 3  . pantoprazole (PROTONIX) 40 MG tablet Take 1 tablet (40 mg total) by mouth 2 (two) times daily. 180 tablet 1  . polyethylene glycol (MIRALAX / GLYCOLAX) packet Take 17 g by mouth daily as needed for moderate constipation.    . pregabalin (LYRICA) 75 MG capsule TAKE 1 CAPSULE BY MOUTH TWICE A DAY 60 capsule 3  . rosuvastatin (CRESTOR) 20 MG tablet Take 1 tablet (20 mg total) by mouth daily at 6 PM. 30 tablet 0  . TRUEPLUS LANCETS 33G MISC Use as directed 300 each 3   No current facility-administered medications for this visit.    (Not in a hospital admission)   Family History  Problem Relation Age of Onset  . Hypertension Mother   . Diabetes Father   . Hypertension Father   . Hypertension Sister   . Hypertension Brother   . Hypertension Brother   . Diabetes Brother   . Diabetes Brother   . Diabetes Other        Family Hx. of Diabetes  . Hypertension Other        Family History of HTN  . Deep vein thrombosis Daughter   . Breast cancer Maternal Aunt      Review of Systems:   Review of Systems  Constitutional: Negative for diaphoresis, fever and malaise/fatigue.  Respiratory: Positive for cough.   Cardiovascular: Positive for chest pain.  Musculoskeletal:  Positive for back pain.      Physical Exam: BP (!) 150/78   Pulse 69   Temp (!) 96.7 F (35.9 C) (Skin)   Resp 20   Ht '5\' 4"'  (1.626 m)   Wt 215 lb (97.5 kg)   SpO2 94% Comment: RA  BMI 36.90 kg/m  Physical Exam  Constitutional: She is oriented to person, place, and time and well-developed, well-nourished, and in no distress. No distress.  HENT:  Head: Normocephalic and atraumatic.  Eyes: EOM are normal. No scleral icterus.  Neck: No tracheal deviation present.  Cardiovascular: Normal rate and normal heart sounds.  No murmur heard. Pulmonary/Chest: Effort normal and breath sounds normal. No respiratory distress. She has no wheezes.  Abdominal: Soft. She exhibits no distension.  Musculoskeletal:        General: Normal range of motion.     Cervical back: Normal range of motion.  Neurological: She is alert and oriented to person, place, and time.  Skin: Skin is warm and dry. She is not diaphoretic.        I have independently reviewed the above radiologic studies and discussed with the patient   Recent Lab Findings: Lab Results  Component Value Date   WBC 7.2 04/19/2019   HGB 10.0 (L) 04/19/2019   HCT 32.8 (L) 04/19/2019   PLT 264 04/19/2019   GLUCOSE 223 (H) 04/20/2019   CHOL 193 04/17/2019   TRIG 208 (H) 04/17/2019   HDL 41 04/17/2019   LDLCALC 110 (H) 04/17/2019   ALT 18 04/17/2019   AST 23 04/17/2019   NA 138 04/20/2019   K 4.4 04/20/2019   CL 105 04/20/2019   CREATININE 2.07 (H) 04/20/2019   BUN 30 (H) 04/20/2019   CO2 24 04/20/2019   TSH 2.13 03/01/2019   INR 1.09 11/17/2014   HGBA1C 10.3 (H) 03/01/2019      Assessment /  Plan:   69 year old female with a 4 cm ascending aneurysm.  She has evidence of high blood pressure which is poorly controlled.  Her lipid panel shows elevated triglycerides and LDL.  She is a lifelong non-smoker.  Risks aortic aneurysmal growth were discussed with her detail, and warning symptoms of aortic dissection were also  covered.  She is scheduled for a repeat CT of the chest in 1 year.  Given her chronic renal insufficiency I have elected to forego contrast with this study.     I  spent 40 minutes counseling the patient face to face.   Lajuana Matte 05/17/2019 11:43 AM

## 2019-05-25 ENCOUNTER — Other Ambulatory Visit (HOSPITAL_COMMUNITY)
Admission: RE | Admit: 2019-05-25 | Discharge: 2019-05-25 | Disposition: A | Discharge status: Home / Self Care | Payer: Medicare HMO | Source: Ambulatory Visit | Attending: Internal Medicine | Admitting: Internal Medicine

## 2019-05-25 DIAGNOSIS — Z01812 Encounter for preprocedural laboratory examination: Secondary | ICD-10-CM | POA: Insufficient documentation

## 2019-05-25 DIAGNOSIS — Z20822 Contact with and (suspected) exposure to covid-19: Secondary | ICD-10-CM | POA: Insufficient documentation

## 2019-05-25 LAB — SARS CORONAVIRUS 2 (TAT 6-24 HRS): SARS Coronavirus 2: NEGATIVE

## 2019-05-27 ENCOUNTER — Ambulatory Visit (HOSPITAL_BASED_OUTPATIENT_CLINIC_OR_DEPARTMENT_OTHER): Payer: Medicare HMO | Admitting: Internal Medicine

## 2019-05-28 ENCOUNTER — Other Ambulatory Visit: Payer: Self-pay

## 2019-05-28 ENCOUNTER — Ambulatory Visit (HOSPITAL_BASED_OUTPATIENT_CLINIC_OR_DEPARTMENT_OTHER): Payer: Medicare HMO | Attending: Critical Care Medicine | Admitting: Internal Medicine

## 2019-05-28 DIAGNOSIS — R9389 Abnormal findings on diagnostic imaging of other specified body structures: Secondary | ICD-10-CM | POA: Diagnosis not present

## 2019-05-28 DIAGNOSIS — R0683 Snoring: Secondary | ICD-10-CM | POA: Diagnosis not present

## 2019-05-28 DIAGNOSIS — R062 Wheezing: Secondary | ICD-10-CM | POA: Insufficient documentation

## 2019-06-01 DIAGNOSIS — R0683 Snoring: Secondary | ICD-10-CM

## 2019-06-01 NOTE — Procedures (Signed)
Patient Name: [name], [name] Date: 05/28/2019 Gender: Female D.O.B: 23-Feb-1951 Age (years): 58 Referring Provider: Noemi Chapel DO Height (inches): 3 Interpreting Physician: Baird Lyons MD, ABSM Weight (lbs): 215 RPSGT: Zadie Rhine BMI: 37 MRN: [id number] Neck Size: 15.50  CLINICAL INFORMATION Sleep Study Type: NPSG Indication for sleep study: Snoring Epworth Sleepiness Score: 3  SLEEP STUDY TECHNIQUE As per the AASM Manual for the Scoring of Sleep and Associated Events v2.3 (April 2016) with a hypopnea requiring 4% desaturations.  The channels recorded and monitored were frontal, central and occipital EEG, electrooculogram (EOG), submentalis EMG (chin), nasal and oral airflow, thoracic and abdominal wall motion, anterior tibialis EMG, snore microphone, electrocardiogram, and pulse oximetry.  MEDICATIONS Medications self-administered by patient taken the night of the study : none reported  SLEEP ARCHITECTURE The study was initiated at 10:42:27 PM and ended at 4:44:22 AM.  Sleep onset time was 24.9 minutes and the sleep efficiency was 81.8%%. The total sleep time was 296 minutes.  Stage REM latency was 127.5 minutes.  The patient spent 2.9%% of the night in stage N1 sleep, 72.1%% in stage N2 sleep, 5.2%% in stage N3 and 19.8% in REM.  Alpha intrusion was absent.  Supine sleep was 90.88%.  RESPIRATORY PARAMETERS The overall apnea/hypopnea index (AHI) was 9.7 per hour. There were 0 total apneas, including 0 obstructive, 0 central and 0 mixed apneas. There were 48 hypopneas and 3 RERAs.  The AHI during Stage REM sleep was 16.4 per hour.  AHI while supine was 9.4 per hour.  The mean oxygen saturation was 90.4%. The minimum SpO2 during sleep was 82.0%.  soft snoring was noted during this study.  CARDIAC DATA The 2 lead EKG demonstrated sinus rhythm. The mean heart rate was 48.3 beats per minute. Other EKG findings include: None.  LEG MOVEMENT DATA The  total PLMS were 0 with a resulting PLMS index of 0.0. Associated arousal with leg movement index was 0.0 .  IMPRESSIONS - Mild obstructive sleep apnea occurred during this study (AHI = 9.7/h). - No significant central sleep apnea occurred during this study (CAI = 0.0/h). - Oxygen desaturation was noted during this study (Min O2 = 82.0%). Mean sat 90.4%. - A total of 14.9 minutes was recorded with oxygen saturation 88% or less. - The patient snored with soft snoring volume. - No cardiac abnormalities were noted during this study. - Clinically significant periodic limb movements did not occur during sleep. No significant associated arousals.  DIAGNOSIS - Obstructive Sleep Apnea (327.23 [G47.33 ICD-10]) - Nocturnal Hypoxemia (327.26 [G47.36 ICD-10])  RECOMMENDATIONS - Treatment for mild OSA is directed at symptoms. Given the oxygen desaturation as recorded, suggest CPAP titration sleep study or autopap. Other options would be based on clinical judgment. - Be careful with alcohol, sedatives and other CNS depressants that may worsen sleep apnea and disrupt normal sleep architecture. - Sleep hygiene should be reviewed to assess factors that may improve sleep quality. - Weight management and regular exercise should be initiated or continued if appropriate.  [Electronically signed] 06/01/2019 12:31 PM  Baird Lyons MD, [place], American Board of Sleep Medicine   NPI: [id number]                          [place], [place] [facility]  ELECTRONICALLY SIGNED ON:  06/01/2019, 12:27 PM [place] PH: [phone]   FX: [phone] [place]

## 2019-06-03 NOTE — Progress Notes (Signed)
Can you please let her know that she does have mild sleep apnea but with her history this should be treated. She will need a sleep titration study to be performed and to be established with one of the sleep medicine docs. Thanks!

## 2019-06-14 ENCOUNTER — Other Ambulatory Visit: Payer: Self-pay | Admitting: General Practice

## 2019-06-14 MED ORDER — TRUE METRIX BLOOD GLUCOSE TEST VI STRP: ORAL_STRIP | 12 refills | Status: DC

## 2019-06-14 MED ORDER — TRUEPLUS LANCETS 33G MISC: 3 refills | Status: DC

## 2019-06-14 MED ORDER — BD SWAB SINGLE USE REGULAR PADS: MEDICATED_PAD | 1 refills | Status: DC

## 2019-06-14 MED ORDER — TRUE METRIX AIR GLUCOSE METER W/DEVICE KIT: 1.0000 | PACK | Freq: Three times a day (TID) | 0 refills | Status: AC

## 2019-06-21 ENCOUNTER — Other Ambulatory Visit: Payer: Self-pay | Admitting: General Practice

## 2019-06-21 MED ORDER — PANTOPRAZOLE SODIUM 40 MG PO TBEC: 40.0000 mg | DELAYED_RELEASE_TABLET | Freq: Two times a day (BID) | ORAL | 1 refills | Status: DC

## 2019-06-21 MED ORDER — CARVEDILOL 6.25 MG PO TABS: 6.2500 mg | ORAL_TABLET | Freq: Two times a day (BID) | ORAL | 1 refills | Status: DC

## 2019-06-21 MED ORDER — LEVOTHYROXINE SODIUM 100 MCG PO TABS: 100.0000 ug | ORAL_TABLET | Freq: Every day | ORAL | 1 refills | Status: DC

## 2019-06-26 ENCOUNTER — Other Ambulatory Visit: Payer: Self-pay

## 2019-06-26 ENCOUNTER — Encounter: Payer: Self-pay | Admitting: Family Medicine

## 2019-06-26 ENCOUNTER — Ambulatory Visit (INDEPENDENT_AMBULATORY_CARE_PROVIDER_SITE_OTHER): Payer: Medicare HMO | Admitting: Family Medicine

## 2019-06-26 ENCOUNTER — Ambulatory Visit (INDEPENDENT_AMBULATORY_CARE_PROVIDER_SITE_OTHER): Payer: Medicare HMO

## 2019-06-26 VITALS — BP 160/78 | HR 71 | Temp 97.2°F | Ht 64.0 in | Wt 213.6 lb

## 2019-06-26 DIAGNOSIS — H43821 Vitreomacular adhesion, right eye: Secondary | ICD-10-CM | POA: Diagnosis not present

## 2019-06-26 DIAGNOSIS — H35372 Puckering of macula, left eye: Secondary | ICD-10-CM | POA: Diagnosis not present

## 2019-06-26 DIAGNOSIS — E559 Vitamin D deficiency, unspecified: Secondary | ICD-10-CM | POA: Diagnosis not present

## 2019-06-26 DIAGNOSIS — Z0000 Encounter for general adult medical examination without abnormal findings: Secondary | ICD-10-CM | POA: Insufficient documentation

## 2019-06-26 DIAGNOSIS — Z961 Presence of intraocular lens: Secondary | ICD-10-CM | POA: Diagnosis not present

## 2019-06-26 DIAGNOSIS — H16223 Keratoconjunctivitis sicca, not specified as Sjogren's, bilateral: Secondary | ICD-10-CM | POA: Diagnosis not present

## 2019-06-26 DIAGNOSIS — E1159 Type 2 diabetes mellitus with other circulatory complications: Secondary | ICD-10-CM | POA: Diagnosis not present

## 2019-06-26 DIAGNOSIS — H35033 Hypertensive retinopathy, bilateral: Secondary | ICD-10-CM | POA: Diagnosis not present

## 2019-06-26 IMAGING — CR DG CHEST 2V
2 series · 2 of 2 positions shown · non-contrast
Comparison: 08/30/2015

CLINICAL DATA: 66-year-old female with chest pain.

EXAM:
CHEST  2 VIEW

[w chest pa]
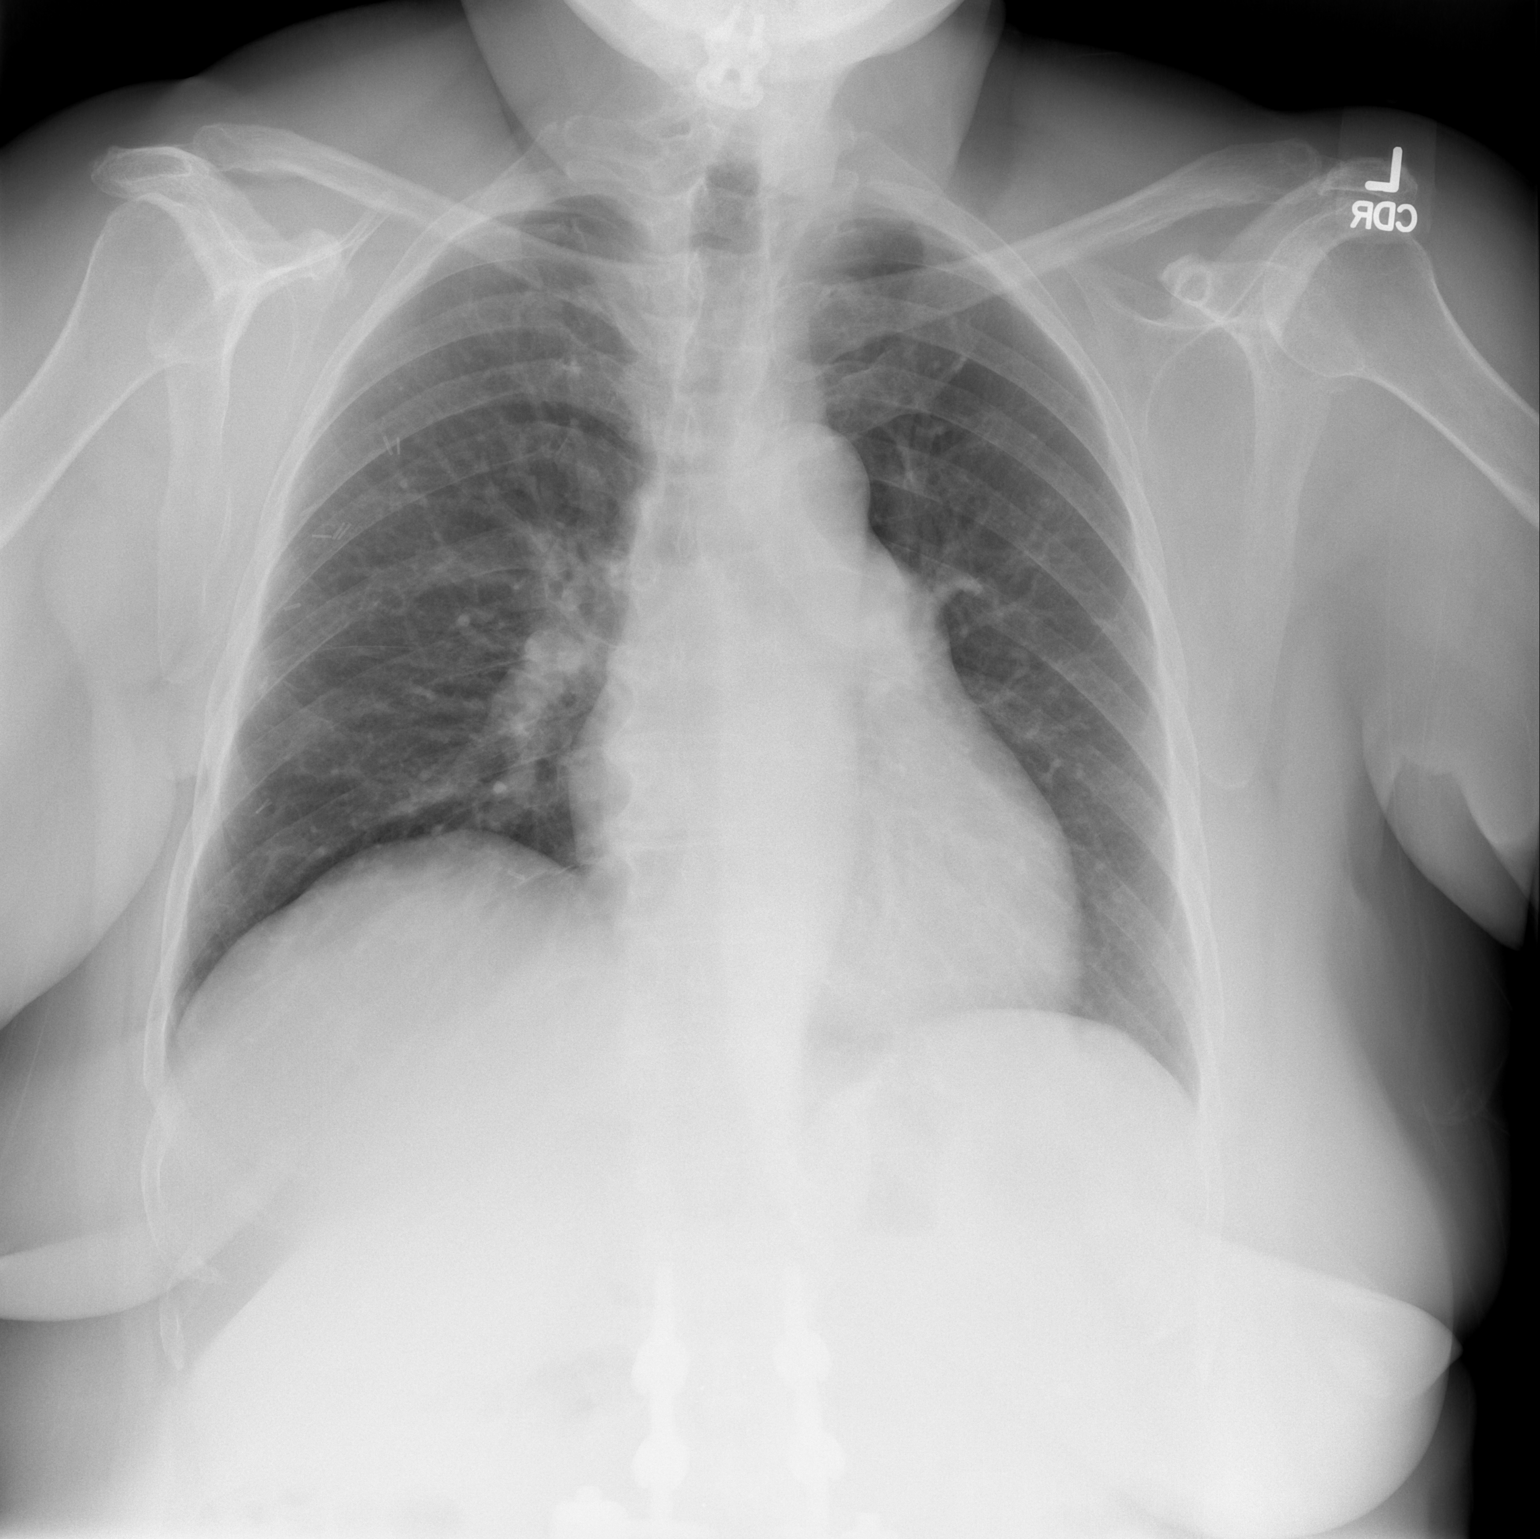

[w chest lat *]
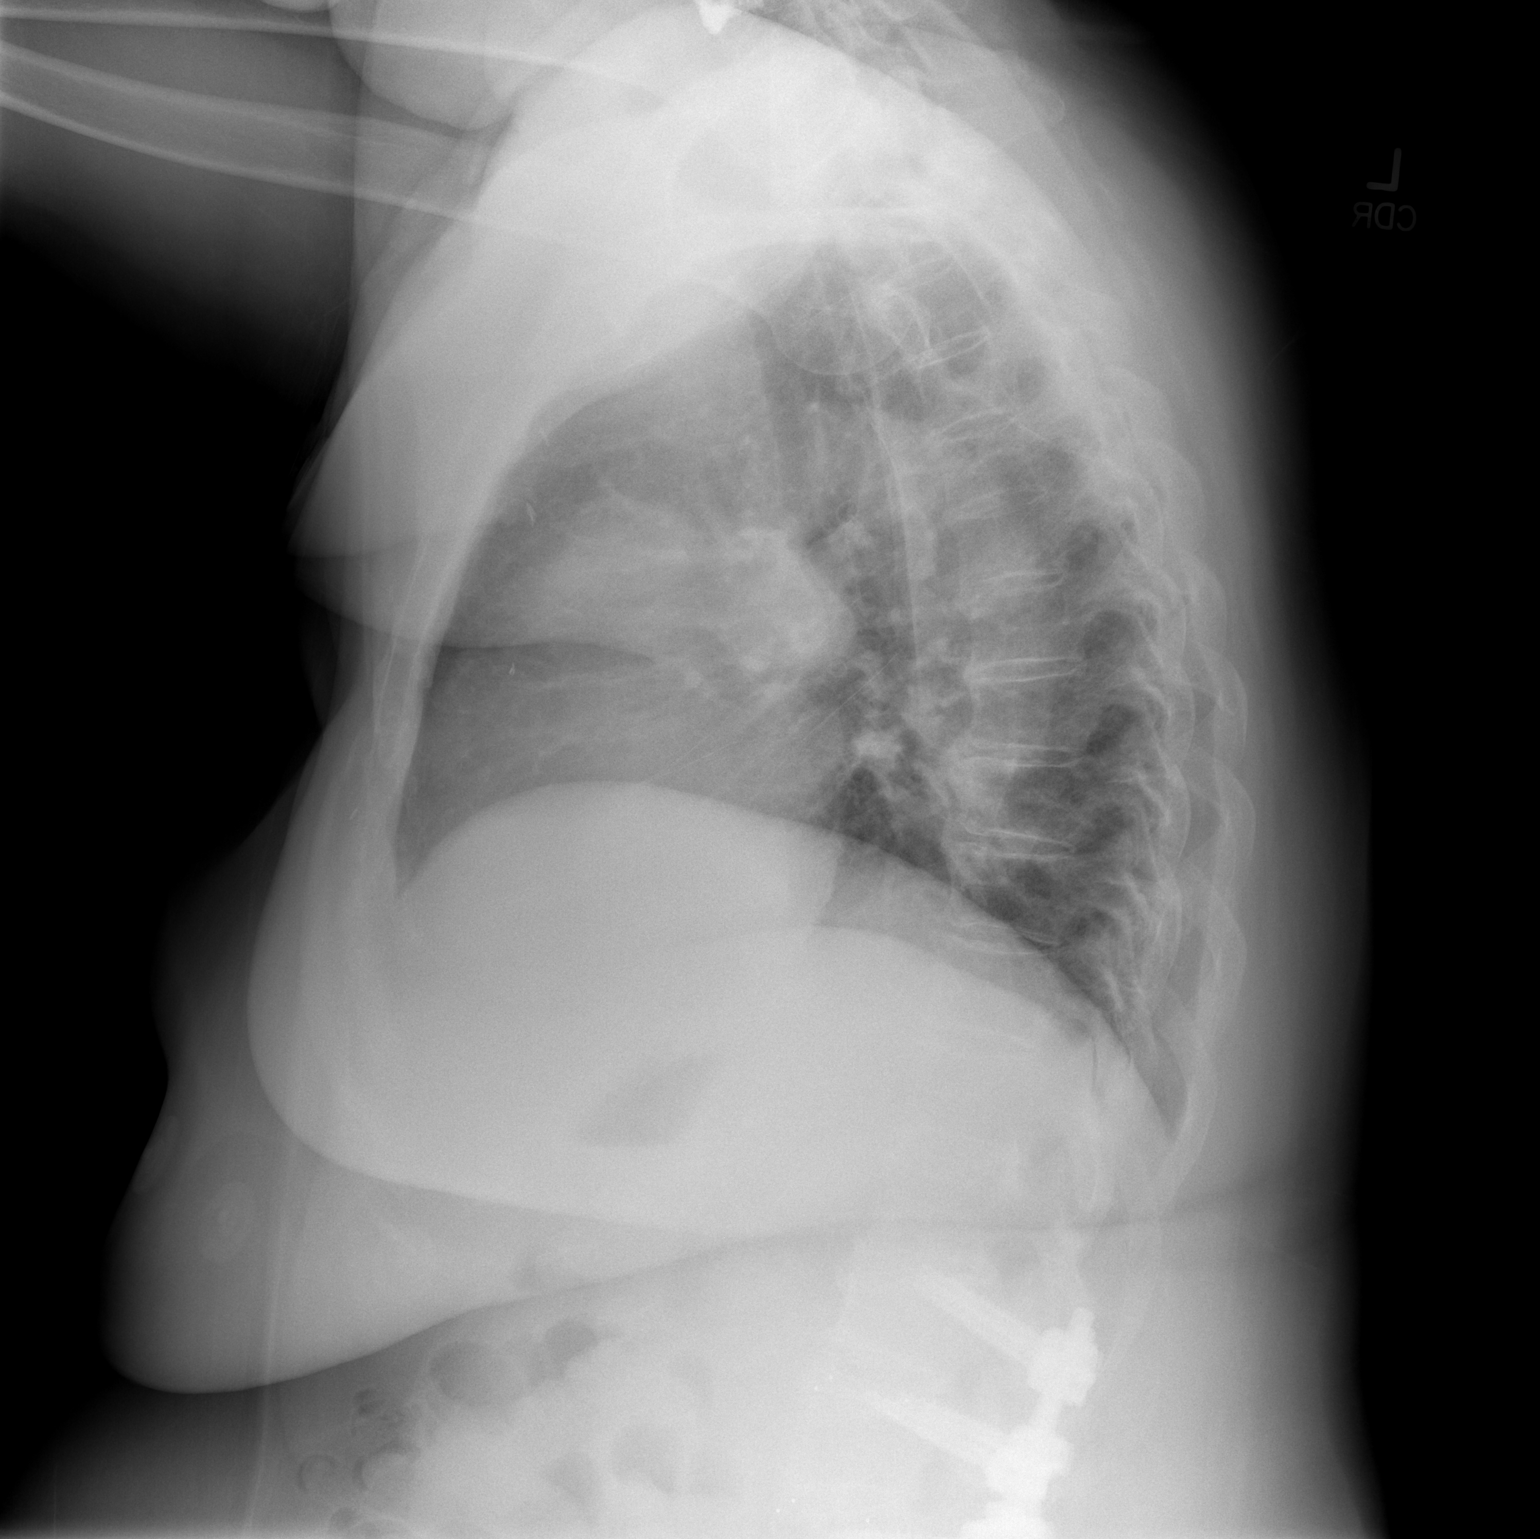

[2 of 2 positions shown; findings below may reference images not displayed]

FINDINGS: The heart size and mediastinal contours are within normal limits.
Both lungs are clear. The visualized skeletal structures are
unremarkable.
IMPRESSION: No active cardiopulmonary disease.

## 2019-06-26 NOTE — Progress Notes (Signed)
Subjective:   [name] is a 69 y.o. female who presents for Medicare Annual (Subsequent) preventive examination.  Review of Systems:   Cardiac Risk Factors include: advanced age (>63mn, >>42women);diabetes mellitus;dyslipidemia;hypertension    Objective:     Vitals: BP (!) 160/78   Pulse 71   Temp (!) 97.2 F (36.2 C) (Temporal)   Ht '5\' 4"'  (1.626 m)   Wt 213 lb 9.6 oz (96.9 kg)   SpO2 92%   BMI 36.66 kg/m   Body mass index is 36.66 kg/m.  Advanced Directives 06/26/2019 05/28/2019 04/17/2019 04/15/2019 01/19/2018 12/25/2017 09/29/2017  Does Patient Have a Medical Advance Directive? Yes Yes Yes No Yes Yes Yes  Type of Advance Directive Living will;Healthcare Power of Attorney Living will Living will - Living will Living will [place]  Does patient want to make changes to medical advance directive? No - Patient declined - No - Patient declined - No - Patient declined - -  Copy of [place]? No - copy requested - - - - - No - copy requested  Would patient like information on creating a medical advance directive? - - - - - - -  Pre-existing out of facility DNR order (yellow form or pink MOST form) - - - - - - -    Tobacco Social History   Tobacco Use  Smoking Status Never Smoker  Smokeless Tobacco Never Used     Counseling given: Not Answered   Clinical Intake:  Pre-visit preparation completed: Yes  Pain : No/denies pain  Diabetes: Yes CBG done?: No Did pt. bring in CBG monitor from home?: No  How often do you need to have someone help you when you read instructions, pamphlets, or other written materials from your doctor or pharmacy?: 1 - Never  Interpreter Needed?: No  Information entered by :: CDenman GeorgeLPN  Past Medical History:  Diagnosis Date  . Anginal pain ([place] 03/19/2012   saw Dr. NCathie Olden.. she thinks its more related to stomach issues  . Arthritis    "all over; mostly in my  back" (03/20/2012)  . Breast cancer ([place]    "right" (03/20/2012)  . Cardiomyopathy   . Chest pain 06/2005   Hospitalized, dystolic dysfunction  . Chronic lower back pain    "have 2 herniated discs; going to have to have a fusion" (03/20/2012)  . CKD (chronic kidney disease), stage III   . Family history of anesthesia complication    "father has nausea and vomiting"  . Gallstones   . GERD (gastroesophageal reflux disease)   . H/O hiatal hernia   . History of kidney stones   . History of right mastectomy   . Hypertension    "patient states never had HTN, takes Hyzaar for heart  . Hypothyroidism   . Irritable bowel syndrome   . Migraines    "it's been a long time since I've had one" (03/20/2012)  . Osteoarthritis   . Peripheral neuropathy   . PONV (postoperative nausea and vomiting)   . Sciatica   . Type II diabetes mellitus ([place]    Past Surgical History:  Procedure Laterality Date  . ABDOMINAL HYSTERECTOMY  1993  . adenosin cardiolite  07/2005   (+) wall motion abnormality  . AXILLARY LYMPH NODE DISSECTION  2003   squamous cell cancer  . BACK SURGERY    . CARDIAC CATHETERIZATION  ? 2005 / 2007  . CHOLECYSTECTOMY N/A 01/24/2018  Procedure: LAPAROSCOPIC CHOLECYSTECTOMY;  Surgeon: Erroll Luna, MD;  Location: [facility];  Service: General;  Laterality: N/A;  . CT abdomen and pelvis  02/2010   Same  . ESOPHAGOGASTRODUODENOSCOPY  2005   GERD  . KNEE ARTHROSCOPY  1990's   "right" (03/20/2012)  . LUMBAR FUSION N/A 08/22/2014   Procedure: Right L2-3 and right L1-2 transforaminal lumbar interbody fusion with pedicle screws, rods, sleeves, cages, local bone graft, Vivigen, cancellous chips;  Surgeon: Jessy Oto, MD;  Location: [facility];  Service: Orthopedics;  Laterality: N/A;  . LUMBAR LAMINECTOMY/DECOMPRESSION MICRODISCECTOMY N/A 02/04/2014   Procedure: RIGHT L2-3 MICRODISCECTOMY ;  Surgeon: Jessy Oto, MD;  Location: [facility];  Service: Orthopedics;  Laterality: N/A;  .  MASTECTOMY Right   . MASTECTOMY WITH AXILLARY LYMPH NODE DISSECTION  12/2003   "right" (03/20/2012)  . POSTERIOR CERVICAL FUSION/FORAMINOTOMY  2001  . SHOULDER ARTHROSCOPY W/ ROTATOR CUFF REPAIR  2003   Left  . THYROIDECTOMY  1977   Right  . TOTAL KNEE ARTHROPLASTY  2000   Right  . [place] of abdomen  07/2005   Fatty liver / kidney stones   Family History  Problem Relation Age of Onset  . Hypertension Mother   . Diabetes Father   . Hypertension Father   . Hypertension Sister   . Hypertension Brother   . Hypertension Brother   . Diabetes Brother   . Diabetes Brother   . Diabetes Other        Family Hx. of Diabetes  . Hypertension Other        Family History of HTN  . Deep vein thrombosis Daughter   . Breast cancer Maternal Aunt    Social History   Socioeconomic History  . Marital status: Married    Spouse name: Not on file  . Number of children: 1  . Years of education: Not on file  . Highest education level: Not on file  Occupational History  . Occupation: Retired since 2003  Tobacco Use  . Smoking status: Never Smoker  . Smokeless tobacco: Never Used  Substance and Sexual Activity  . Alcohol use: No  . Drug use: No  . Sexual activity: Not on file  Other Topics Concern  . Not on file  Social History Narrative  . Not on file   Social Determinants of Health   Financial Resource Strain:   . Difficulty of Paying Living Expenses:   Food Insecurity:   . Worried About Charity fundraiser in the Last Year:   . Arboriculturist in the Last Year:   Transportation Needs:   . Film/video editor (Medical):   [name] Kitchen Lack of Transportation (Non-Medical):   Physical Activity:   . Days of Exercise per Week:   . Minutes of Exercise per Session:   Stress:   . Feeling of Stress :   Social Connections:   . Frequency of Communication with Friends and Family:   . Frequency of Social Gatherings with Friends and Family:   . Attends Religious Services:   . Active Member of Clubs  or Organizations:   . Attends Archivist Meetings:   [name] Kitchen Marital Status:     Outpatient Encounter Medications as of 06/26/2019  Medication Sig  . acetaminophen (TYLENOL) 500 MG tablet Take 1,000 mg by mouth every 6 (six) hours as needed for moderate pain or headache.  . albuterol (VENTOLIN HFA) 108 (90 Base) MCG/ACT inhaler Inhale 2 puffs into the lungs every 6 (six)  hours as needed for wheezing or shortness of breath.  . Alcohol Swabs (B-D SINGLE USE SWABS REGULAR) PADS Use as directed.  [name] Kitchen amLODipine (NORVASC) 10 MG tablet TAKE 1 TABLET BY MOUTH EVERY DAY  . aspirin 81 MG tablet Take 81 mg by mouth daily.  . Blood Glucose Calibration (TRUE METRIX LEVEL 2) Normal SOLN Use as directed  . Blood Glucose Monitoring Suppl (TRUE METRIX AIR GLUCOSE METER) w/Device KIT 1 kit by Does not apply route 3 (three) times daily.  . carvedilol (COREG) 6.25 MG tablet Take 1 tablet (6.25 mg total) by mouth 2 (two) times daily with a meal.  . diclofenac Sodium (VOLTAREN) 1 % GEL Apply 4 g topically 4 (four) times daily as needed.  . fluticasone (FLONASE) 50 MCG/ACT nasal spray Place 2 sprays into both nostrils daily. (Patient taking differently: Place 2 sprays into both nostrils daily as needed for allergies. )  . furosemide (LASIX) 40 MG tablet TAKE 1 TABLET (40 MG TOTAL) BY MOUTH DAILY AS NEEDED (LEG SWELLING).  [name] Kitchen glucose blood (TRUE METRIX BLOOD GLUCOSE TEST) test strip Use as instructed  . insulin NPH-regular Human (70-30) 100 UNIT/ML injection Inject 50-70 Units into the skin See admin instructions. 70 units in the morning and 50 units at night.  . levothyroxine (SYNTHROID) 100 MCG tablet Take 1 tablet (100 mcg total) by mouth daily.  . pantoprazole (PROTONIX) 40 MG tablet Take 1 tablet (40 mg total) by mouth 2 (two) times daily.  . polyethylene glycol (MIRALAX / GLYCOLAX) packet Take 17 g by mouth daily as needed for moderate constipation.  . pregabalin (LYRICA) 75 MG capsule TAKE 1 CAPSULE BY MOUTH  TWICE A DAY  . rosuvastatin (CRESTOR) 20 MG tablet Take 1 tablet (20 mg total) by mouth daily at 6 PM.  . TRUEplus Lancets 33G MISC Use as directed   No facility-administered encounter medications on file as of 06/26/2019.    Activities of Daily Living In your present state of health, do you have any difficulty performing the following activities: 06/26/2019 06/26/2019  Hearing? - N  Vision? - N  Difficulty concentrating or making decisions? - N  Walking or climbing stairs? - Y  Comment - due to chronic back pain  Dressing or bathing? - N  Doing errands, shopping? - Y  Comment - due to back pain  Preparing Food and eating ? N N  Using the Toilet? N N  In the past six months, have you accidently leaked urine? N N  Do you have problems with loss of bowel control? N N  Managing your Medications? N N  Managing your Finances? N N  Housekeeping or managing your Housekeeping? N N  Some recent data might be hidden    Patient Care Team: [name], MD as PCP - General (Family Medicine) [name], MD as PCP - Cardiology (Cardiology) Cassandria Anger, MD as Consulting Physician (Endocrinology) Trula Yassen Kinnett, DPM as Consulting Physician (Podiatry) [name], MD as Consulting Physician (Pulmonary Disease) [name], MD as Consulting Physician (Ophthalmology)    Assessment:   This is a routine wellness examination for [place].  Exercise Activities and Dietary recommendations Current Exercise Habits: The patient does not participate in regular exercise at present  Goals   None     Fall Risk Fall Risk  06/26/2019 04/26/2019 12/27/2018 12/03/2018 10/04/2018  Falls in the past year? 0 0 0 0 0  Number falls in past yr: 0 0 0 0 -  Injury with Fall? 0 0 0 0 -  Risk for fall due to : Medication side effect - - - -  Follow up Falls evaluation completed;Education provided;Falls prevention discussed Falls evaluation completed - - Falls evaluation  completed   Is the patient's home free of loose throw rugs in walkways, pet beds, electrical cords, etc?   yes      Grab bars in the bathroom? yes      Handrails on the stairs?   yes      Adequate lighting?   yes  Timed Get Up and Go performed: completed and within normal timeframe; no gait abnormalities noted   Depression Screen PHQ 2/9 Scores 06/26/2019 04/26/2019 12/27/2018 12/03/2018  PHQ - 2 Score 4 0 0 0  PHQ- 9 Score 15 0 0 0     Cognitive Function     6CIT Screen 06/26/2019  What Year? 0 points  What month? 0 points  What time? 0 points  Count back from 20 0 points  Months in reverse 0 points  Repeat phrase 0 points  Total Score 0    Immunization History  Administered Date(s) Administered  . Influenza Split 01/31/2011  . Influenza Whole 02/23/2007  . Influenza, High Dose Seasonal PF 01/10/2017  . Influenza,inj,Quad PF,6+ Mos 01/07/2014, 02/09/2016, 01/01/2018, 12/03/2018  . Influenza-Unspecified 01/10/2012  . Pneumococcal Conjugate-13 05/16/2019  . Pneumococcal Polysaccharide-23 08/25/2014  . Tdap 04/20/2011    Qualifies for Shingles Vaccine?Discussed and patient will check with pharmacy for coverage.  Patient education handout provided   Screening Tests Health Maintenance  Topic Date Due  . FOOT EXAM  10/19/2017  . URINE MICROALBUMIN  06/02/2019  . Hepatitis C Screening  12/03/2019 (Originally Aug 29, 1950)  . COLONOSCOPY  06/25/2020 (Originally 06/09/2013)  . MAMMOGRAM  08/24/2019  . HEMOGLOBIN A1C  08/29/2019  . OPHTHALMOLOGY EXAM  11/19/2019  . TETANUS/TDAP  04/19/2021  . INFLUENZA VACCINE  Completed  . DEXA SCAN  Completed  . PNA vac Low Risk Adult  Completed    Cancer Screenings: Lung: Low Dose CT Chest recommended if Age 14-80 years, 30 pack-year currently smoking OR have quit w/in 15years. Patient does not qualify. Breast:  Up to date on Mammogram? Yes   Up to date of Bone Density/Dexa? Yes Colorectal: last colonoscopy 10/08/09 with Dr. Oletta Lamas;  patient would like defer repeat at this time     Plan:  I have personally reviewed and addressed the Medicare Annual Wellness questionnaire and have noted the following in the patient's chart:  A. Medical and social history B. Use of alcohol, tobacco or illicit drugs  C. Current medications and supplements D. Functional ability and status E.  Nutritional status F.  Physical activity G. Advance directives H. List of other physicians I.  Hospitalizations, surgeries, and ER visits in previous 12 months J.  [place] such as hearing and vision if needed, cognitive and depression L. Referrals, records requested, and appointments- none   In addition, I have reviewed and discussed with patient certain preventive protocols, quality metrics, and best practice recommendations. A written personalized care plan for preventive services as well as general preventive health recommendations were provided to patient.   Signed,  Denman George, LPN  Nurse Health Advisor   Nurse Notes: Patient has completed Covid vaccine Therapist, music)

## 2019-06-26 NOTE — Progress Notes (Signed)
   Subjective:    Patient ID: [name], female    DOB: [birth year], 69 y.o.   MRN: [id number]  HPI CPE- Declines colonoscopy, due for mammo in May, UTD on eye exam.  Due for foot exam, microalbumin.   'I have pain every day.  From my neck down to my feet.  Especially my back'- following w/ ortho   Review of Systems Patient reports no vision/ hearing changes, adenopathy,fever, weight change,  persistant/recurrent hoarseness, palpitations, edema, hemoptysis, dyspnea (rest/exertional/paroxysmal nocturnal), gastrointestinal bleeding (melena, rectal bleeding), abdominal pain, significant heartburn, bowel changes, GU symptoms (dysuria, hematuria, incontinence), Gyn symptoms (abnormal  bleeding, pain),  syncope, focal weakness, memory loss, skin/hair/nail changes, abnormal bruising or bleeding, anxiety, or depression.   + CP- following w/ Cards 'cough.  A terrible cough'- has appt upcoming w/ pulmonary Dysphagia- occurring w/ food > water, has appt w/ Dr Henrene Pastor next month + neuropathy  This visit occurred during the SARS-CoV-2 public health emergency.  Safety protocols were in place, including screening questions prior to the visit, additional usage of staff PPE, and extensive cleaning of exam room while observing appropriate contact time as indicated for disinfecting solutions.       Objective:   Physical Exam General Appearance:    Alert, cooperative, no distress, appears stated age  Head:    Normocephalic, without obvious abnormality, atraumatic  Eyes:    PERRL, conjunctiva/corneas clear, EOM's intact, fundi    benign, both eyes  Ears:    Normal TM's and external ear canals, both ears  Nose:   Deferred due to COVID  Throat:   Neck:   Supple, symmetrical, trachea midline, no adenopathy;    Thyroid: no enlargement/tenderness/nodules, s/p R thyroidectomy  Back:     Symmetric, no curvature, ROM normal, no CVA tenderness  Lungs:     Clear to auscultation bilaterally, respirations  unlabored  Chest Wall:    No tenderness or deformity   Heart:    Regular rate and rhythm, S1 and S2 normal, no murmur, rub   or gallop  Breast Exam:    Deferred to mammo  Abdomen:     Soft, non-tender, bowel sounds active all four quadrants,    no masses, no organomegaly  Genitalia:    Deferred  Rectal:    Extremities:   Extremities normal, atraumatic, no cyanosis, +1 LE edema bilaterally  Pulses:   2+ and symmetric all extremities  Skin:   Skin color, texture, turgor normal, no rashes or lesions  Lymph nodes:   Cervical, supraclavicular, and axillary nodes normal  Neurologic:   CNII-XII intact, normal strength, sensation and reflexes    throughout          Assessment & Plan:

## 2019-06-26 NOTE — Patient Instructions (Signed)
[name] , Thank you for taking time to come for your Medicare Wellness Visit. I appreciate your ongoing commitment to your health goals. Please review the following plan we discussed and let me know if I can assist you in the future.   Screening recommendations/referrals: Colorectal Screening: up to date; last colonoscopy 09/24/09   Mammogram: up to date; last 09/04/18 Bone Density: up to date; last 06/13/17  Vision and Dental Exams: Recommended annual ophthalmology exams for early detection of glaucoma and other disorders of the eye Recommended annual dental exams for proper oral hygiene  Diabetic Exams: Diabetic Eye Exam: recommended yearly Diabetic Foot Exam: recommended yearly  Vaccinations: Influenza vaccine: completed 12/03/18 Pneumococcal vaccine: up to date; last 05/16/19 Tdap vaccine: up to date; last 04/20/11  Shingles vaccine: You may receive this vaccine at your local pharmacy. (see handout)   Advanced directives: Please bring a copy of your POA (Power of Attorney) and/or Living Will to your next appointment.  Goals: Recommend to drink at least 6-8 8oz glasses of water per day and consume a balanced diet rich in fresh fruits and vegetables.   Next appointment: Please schedule your Annual Wellness Visit with your Nurse Health Advisor in one year.  Preventive Care 71 Years and Older, Female Preventive care refers to lifestyle choices and visits with your health care provider that can promote health and wellness. What does preventive care include?  A yearly physical exam. This is also called an annual well check.  Dental exams once or twice a year.  Routine eye exams. Ask your health care provider how often you should have your eyes checked.  Personal lifestyle choices, including:  Daily care of your teeth and gums.  Regular physical activity.  Eating a healthy diet.  Avoiding tobacco and drug use.  Limiting alcohol use.  Practicing safe sex.  Taking low-dose  aspirin every day if recommended by your health care provider.  Taking vitamin and mineral supplements as recommended by your health care provider. What happens during an annual well check? The services and screenings done by your health care provider during your annual well check will depend on your age, overall health, lifestyle risk factors, and family history of disease. Counseling  Your health care provider may ask you questions about your:  Alcohol use.  Tobacco use.  Drug use.  Emotional well-being.  Home and relationship well-being.  Sexual activity.  Eating habits.  History of falls.  Memory and ability to understand (cognition).  Work and work Statistician.  Reproductive health. Screening  You may have the following tests or measurements:  Height, weight, and BMI.  Blood pressure.  Lipid and cholesterol levels. These may be checked every 5 years, or more frequently if you are over 38 years old.  Skin check.  Lung cancer screening. You may have this screening every year starting at age 71 if you have a 30-pack-year history of smoking and currently smoke or have quit within the past 15 years.  Fecal occult blood test (FOBT) of the stool. You may have this test every year starting at age 38.  Flexible sigmoidoscopy or colonoscopy. You may have a sigmoidoscopy every 5 years or a colonoscopy every 10 years starting at age 27.  Hepatitis C blood test.  Hepatitis B blood test.  Sexually transmitted disease (STD) testing.  Diabetes screening. This is done by checking your blood sugar (glucose) after you have not eaten for a while (fasting). You may have this done every 1-3 years.  Bone  density scan. This is done to screen for osteoporosis. You may have this done starting at age 23.  Mammogram. This may be done every 1-2 years. Talk to your health care provider about how often you should have regular mammograms. Talk with your health care provider about your  test results, treatment options, and if necessary, the need for more tests. Vaccines  Your health care provider may recommend certain vaccines, such as:  Influenza vaccine. This is recommended every year.  Tetanus, diphtheria, and acellular pertussis (Tdap, Td) vaccine. You may need a Td booster every 10 years.  Zoster vaccine. You may need this after age 6.  Pneumococcal 13-valent conjugate (PCV13) vaccine. One dose is recommended after age 9.  Pneumococcal polysaccharide (PPSV23) vaccine. One dose is recommended after age 29. Talk to your health care provider about which screenings and vaccines you need and how often you need them. This information is not intended to replace advice given to you by your health care provider. Make sure you discuss any questions you have with your health care provider. Document Released: 04/24/2015 Document Revised: 12/16/2015 Document Reviewed: 01/27/2015 Elsevier Interactive Patient Education  2017 [employer] Prevention in the Home Falls can cause injuries. They can happen to people of all ages. There are many things you can do to make your home safe and to help prevent falls. What can I do on the outside of my home?  Regularly fix the edges of walkways and driveways and fix any cracks.  Remove anything that might make you trip as you walk through a door, such as a raised step or threshold.  Trim any bushes or trees on the path to your home.  Use bright outdoor lighting.  Clear any walking paths of anything that might make someone trip, such as rocks or tools.  Regularly check to see if handrails are loose or broken. Make sure that both sides of any steps have handrails.  Any raised decks and porches should have guardrails on the edges.  Have any leaves, snow, or ice cleared regularly.  Use sand or salt on walking paths during winter.  Clean up any spills in your garage right away. This includes oil or grease spills. What can I  do in the bathroom?  Use night lights.  Install grab bars by the toilet and in the tub and shower. Do not use towel bars as grab bars.  Use non-skid mats or decals in the tub or shower.  If you need to sit down in the shower, use a plastic, non-slip stool.  Keep the floor dry. Clean up any water that spills on the floor as soon as it happens.  Remove soap buildup in the tub or shower regularly.  Attach bath mats securely with double-sided non-slip rug tape.  Do not have throw rugs and other things on the floor that can make you trip. What can I do in the bedroom?  Use night lights.  Make sure that you have a light by your bed that is easy to reach.  Do not use any sheets or blankets that are too big for your bed. They should not hang down onto the floor.  Have a firm chair that has side arms. You can use this for support while you get dressed.  Do not have throw rugs and other things on the floor that can make you trip. What can I do in the kitchen?  Clean up any spills right away.  Avoid walking on  wet floors.  Keep items that you use a lot in easy-to-reach places.  If you need to reach something above you, use a strong step stool that has a grab bar.  Keep electrical cords out of the way.  Do not use floor polish or wax that makes floors slippery. If you must use wax, use non-skid floor wax.  Do not have throw rugs and other things on the floor that can make you trip. What can I do with my stairs?  Do not leave any items on the stairs.  Make sure that there are handrails on both sides of the stairs and use them. Fix handrails that are broken or loose. Make sure that handrails are as long as the stairways.  Check any carpeting to make sure that it is firmly attached to the stairs. Fix any carpet that is loose or worn.  Avoid having throw rugs at the top or bottom of the stairs. If you do have throw rugs, attach them to the floor with carpet tape.  Make sure that  you have a light switch at the top of the stairs and the bottom of the stairs. If you do not have them, ask someone to add them for you. What else can I do to help prevent falls?  Wear shoes that:  Do not have high heels.  Have rubber bottoms.  Are comfortable and fit you well.  Are closed at the toe. Do not wear sandals.  If you use a stepladder:  Make sure that it is fully opened. Do not climb a closed stepladder.  Make sure that both sides of the stepladder are locked into place.  Ask someone to hold it for you, if possible.  Clearly mark and make sure that you can see:  Any grab bars or handrails.  First and last steps.  Where the edge of each step is.  Use tools that help you move around (mobility aids) if they are needed. These include:  Canes.  Walkers.  Scooters.  Crutches.  Turn on the lights when you go into a dark area. Replace any light bulbs as soon as they burn out.  Set up your furniture so you have a clear path. Avoid moving your furniture around.  If any of your floors are uneven, fix them.  If there are any pets around you, be aware of where they are.  Review your medicines with your doctor. Some medicines can make you feel dizzy. This can increase your chance of falling. Ask your doctor what other things that you can do to help prevent falls. This information is not intended to replace advice given to you by your health care provider. Make sure you discuss any questions you have with your health care provider. Document Released: 01/22/2009 Document Revised: 09/03/2015 Document Reviewed: 05/02/2014 Elsevier Interactive Patient Education  2017 [employer].

## 2019-06-26 NOTE — Patient Instructions (Addendum)
Follow up in 6 months to recheck BP and cholesterol We'll notify you of your lab results and make any changes if needed Make sure you mention your pain to the orthopedic doctor Talk to Dr Henrene Pastor about the swallowing and abdominal pain Try to eat smaller amounts but more frequently If the orthopedic doctor isn't able to help with the pain, let me know and we can refer to pain management Call with any questions or concerns Stay Safe!  Stay Healthy!

## 2019-06-27 ENCOUNTER — Ambulatory Visit (INDEPENDENT_AMBULATORY_CARE_PROVIDER_SITE_OTHER): Payer: Medicare HMO | Admitting: Surgery

## 2019-06-27 ENCOUNTER — Encounter: Payer: Self-pay | Admitting: Surgery

## 2019-06-27 DIAGNOSIS — T84498A Other mechanical complication of other internal orthopedic devices, implants and grafts, initial encounter: Secondary | ICD-10-CM

## 2019-06-27 DIAGNOSIS — M4807 Spinal stenosis, lumbosacral region: Secondary | ICD-10-CM

## 2019-06-27 IMAGING — CT CT ANGIO CHEST
2 of 6 series · 18 of 36 positions shown · IV contrast (Omni 300)
Comparison: 08/30/2015

CLINICAL DATA: Chest patent.  Positive D-dimer.

EXAM:
CT ANGIOGRAPHY CHEST WITH CONTRAST
TECHNIQUE: Multidetector CT imaging of the chest was performed using the
standard protocol during bolus administration of intravenous
contrast. Multiplanar CT image reconstructions and MIPs were
obtained to evaluate the vascular anatomy.
CONTRAST:  100 mL Omnipaque 300

[Series 7: pe thins · axial · 0.75mm/px · z∈[+1233,+1476]mm · 17 of 275 slices shown]
[im 16/275  lung]
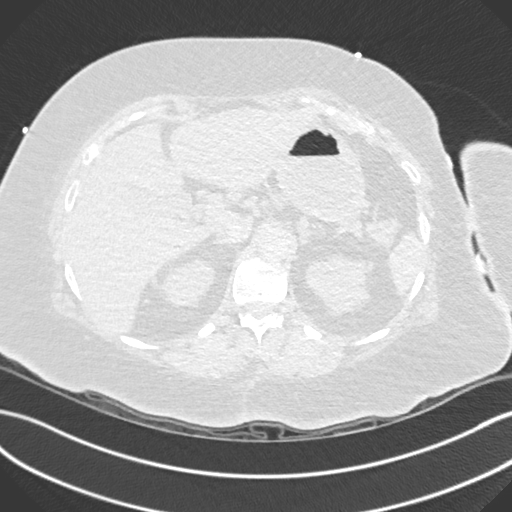
[im 31/275  mediastinal]
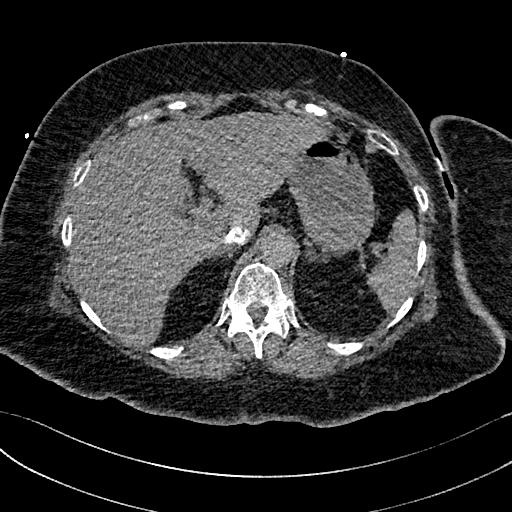
[im 46/275  lung]
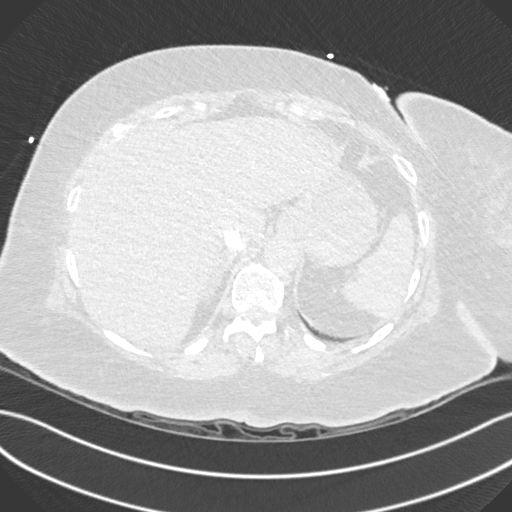
[im 61/275  mediastinal]
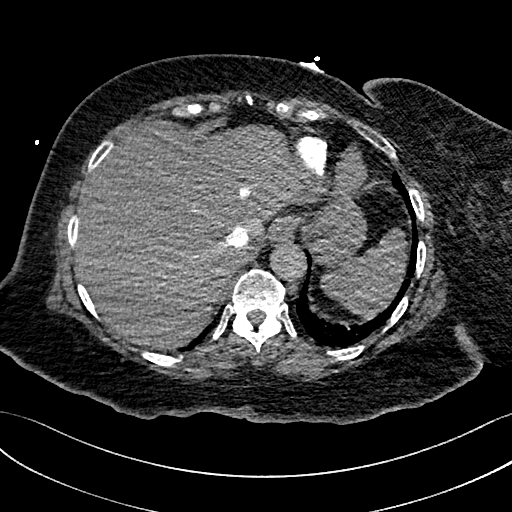
[im 77/275  lung]
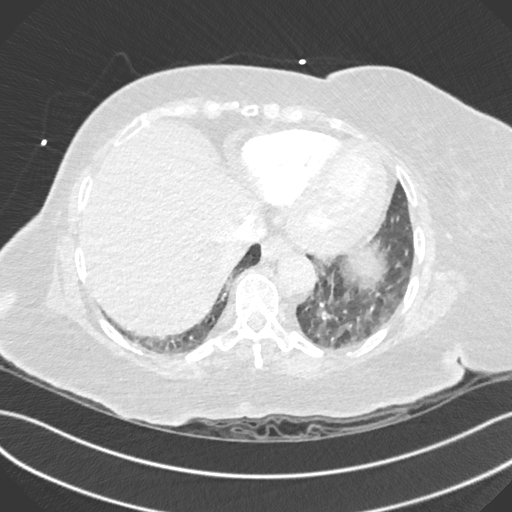
[im 92/275  mediastinal]
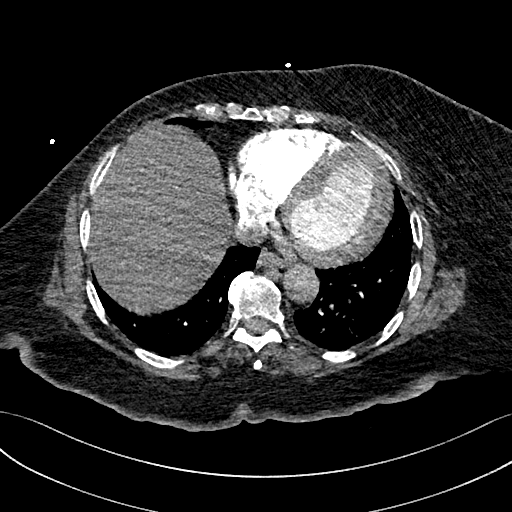
[im 107/275  lung]
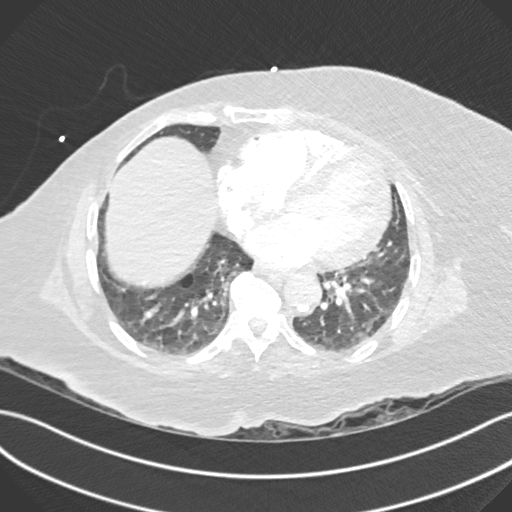
[im 122/275  mediastinal]
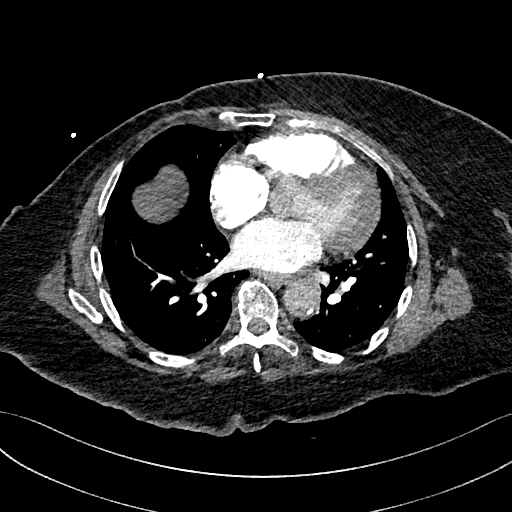
[im 138/275  lung]
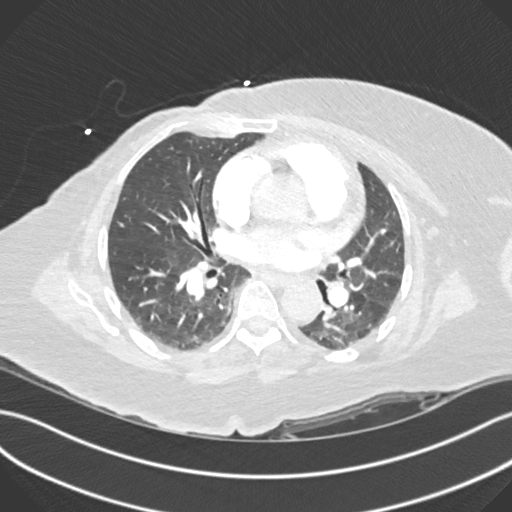
[im 153/275  mediastinal]
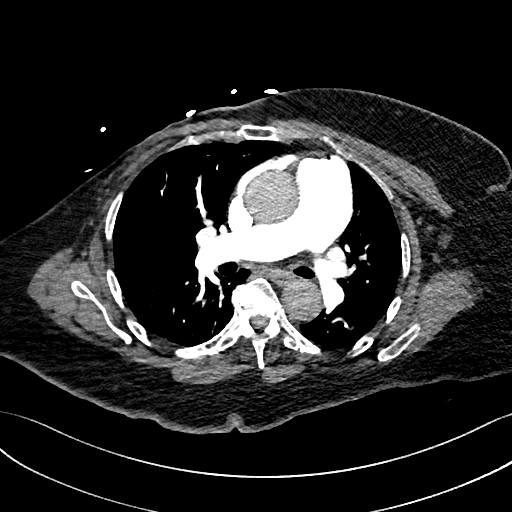
[im 168/275  lung]
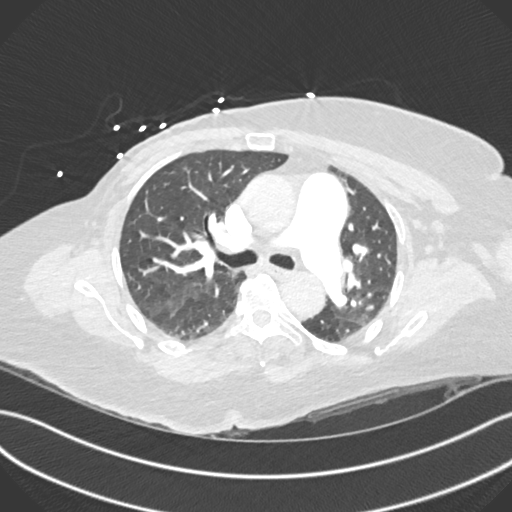
[im 183/275  mediastinal]
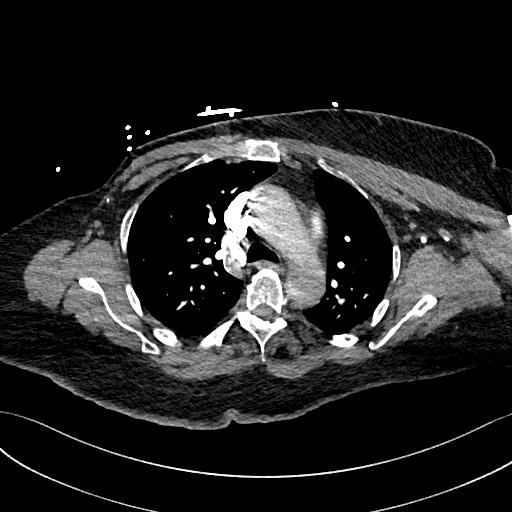
[im 198/275  lung]
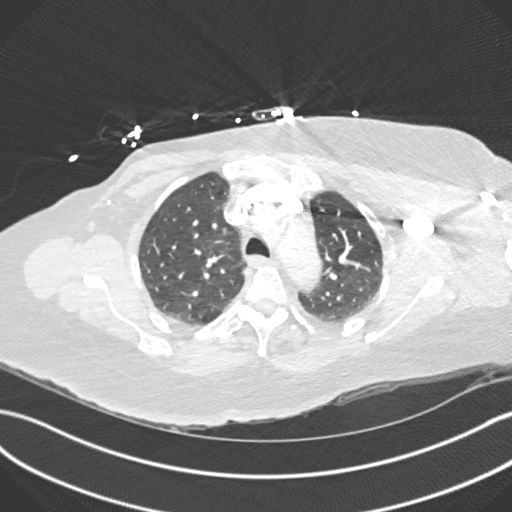
[im 214/275  mediastinal]
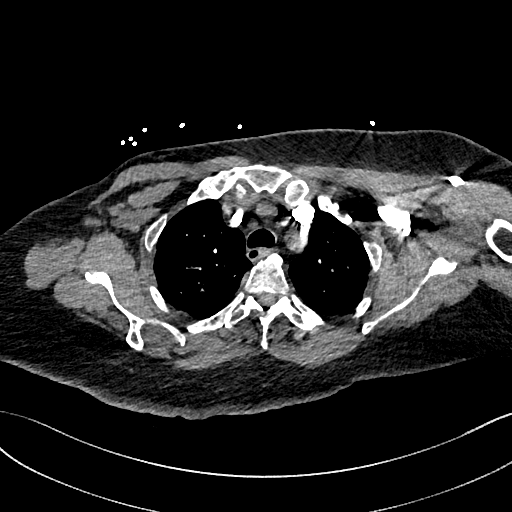
[im 229/275  lung]
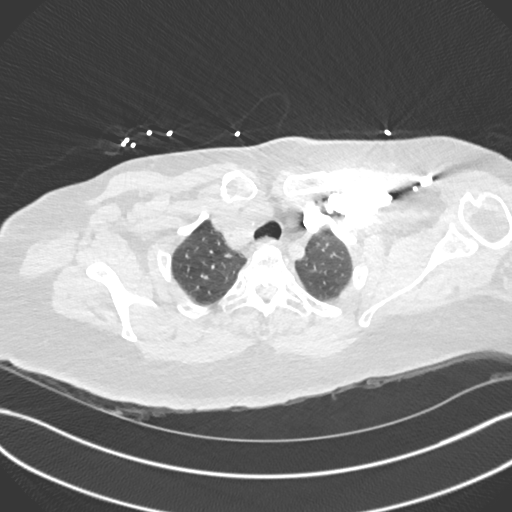
[im 244/275  mediastinal]
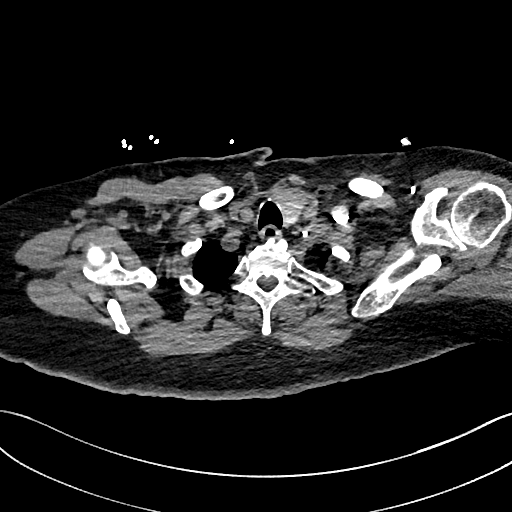
[im 259/275  lung]
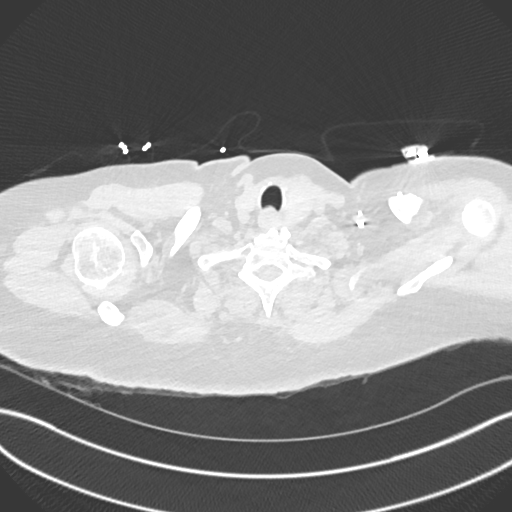

[Series 8: pe 2mm cor · coronal · 0.59mm/px · 1 of 151 slices shown]
[im 76/151  mediastinal]
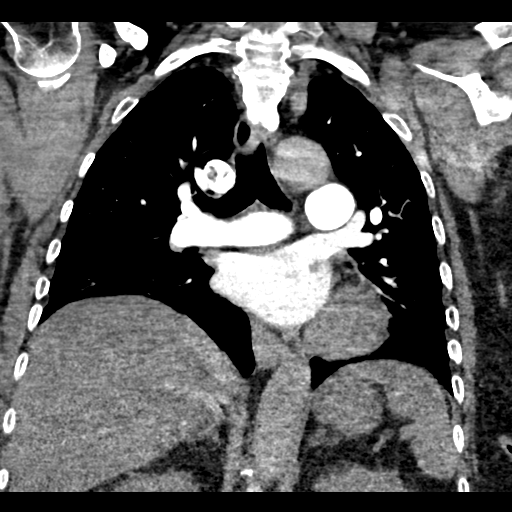

[18 of 36 positions shown; findings below may reference images not displayed]

FINDINGS: Cardiovascular: Good opacification of the central and segmental
pulmonary artery's. No focal filling defects. No evidence of
significant pulmonary embolus. Small amount of gas in the main
pulmonary artery likely results from intravenous injection. Normal
heart size. No pericardial effusion. Normal caliber thoracic aorta
with scattered calcification.

Mediastinum/Nodes: Esophagus is decompressed. No significant
lymphadenopathy.

Lungs/Pleura: Motion artifact limits examination. There is mild
atelectasis in the lung bases. No airspace disease or consolidation.
Airways are patent. No pleural effusions. No pneumothorax.

Upper Abdomen: No acute abnormality.

Musculoskeletal: No chest wall abnormality. No acute or significant
osseous findings.

Review of the MIP images confirms the above findings.
IMPRESSION: 1. No evidence of significant pulmonary embolus.
2. Atelectasis in the lung bases.  No focal consolidation.

Aortic Atherosclerosis ([id number].M).

## 2019-06-27 IMAGING — CT CT ANGIO CHEST
2 of 6 series · 18 of 36 positions shown · IV contrast (Omni 300)
Comparison: none

[Series 7: pe thins · axial · 0.75mm/px · z∈[+1233,+1476]mm · 17 of 275 slices shown]
[im 16/275  lung]
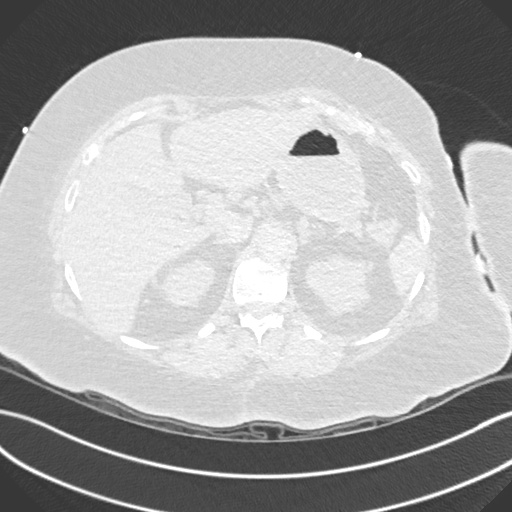
[im 31/275  mediastinal]
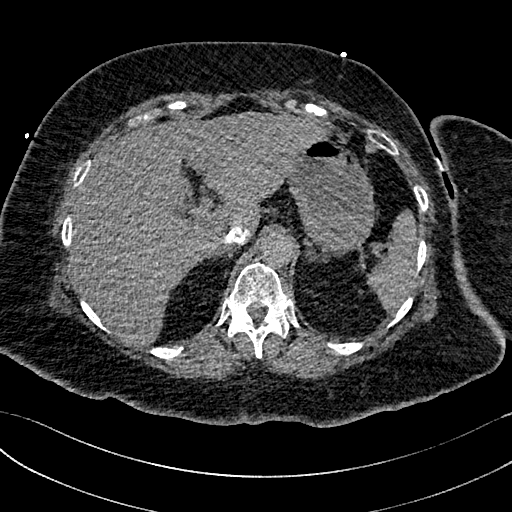
[im 46/275  lung]
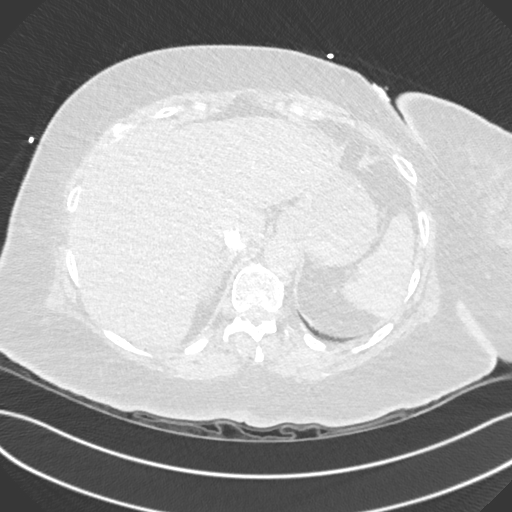
[im 61/275  mediastinal]
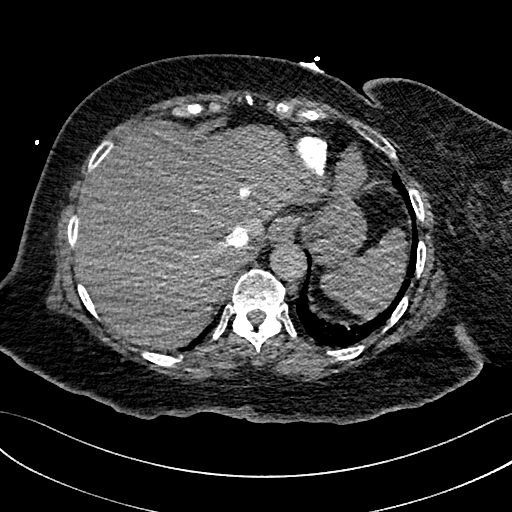
[im 77/275  lung]
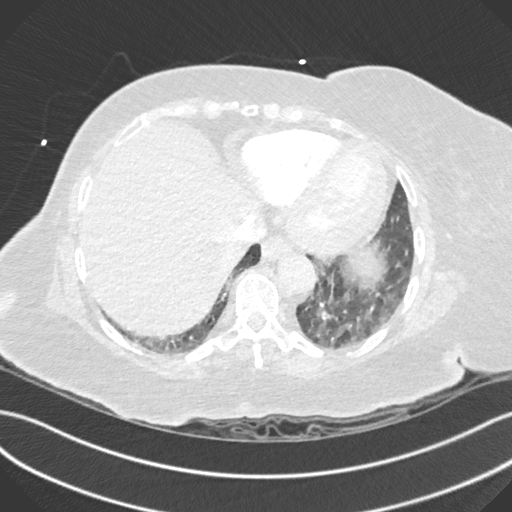
[im 92/275  mediastinal]
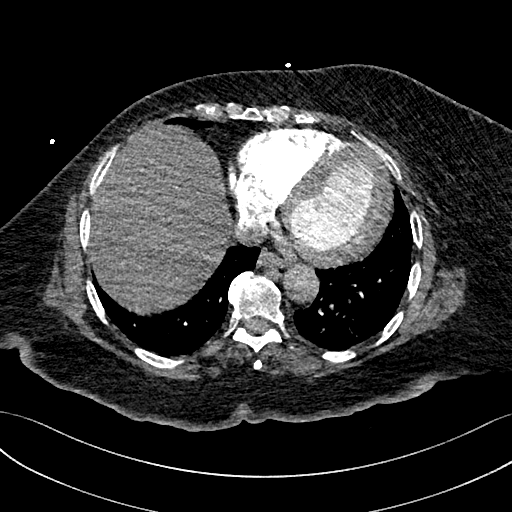
[im 107/275  lung]
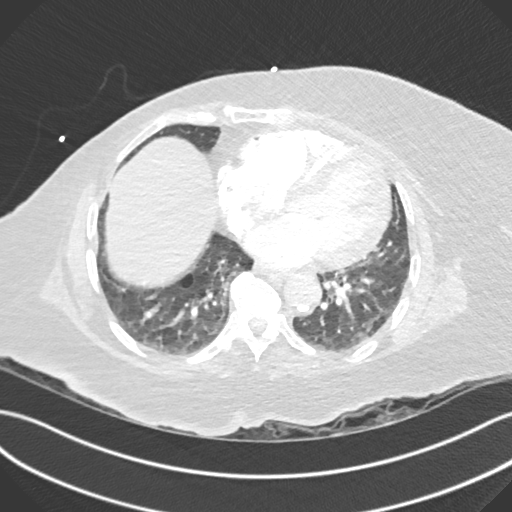
[im 122/275  mediastinal]
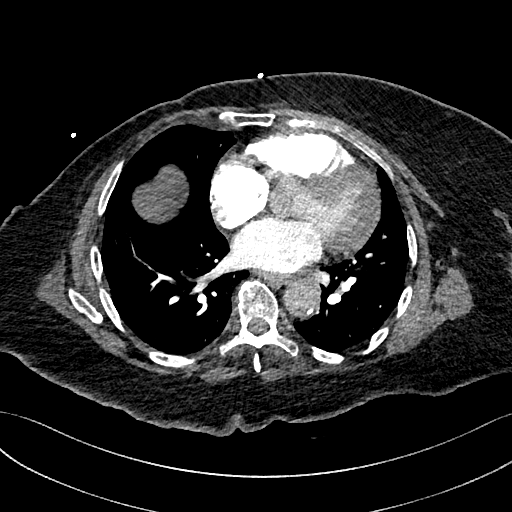
[im 138/275  lung]
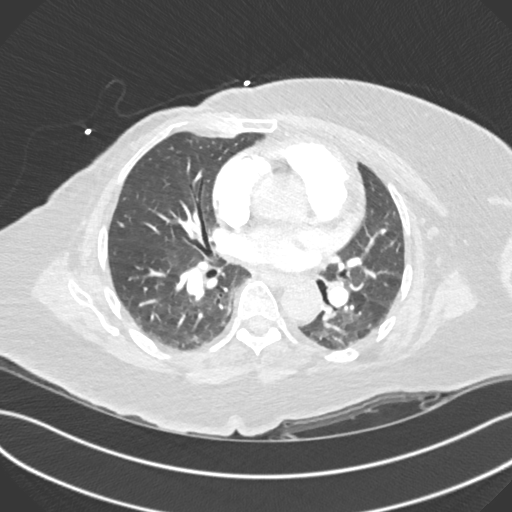
[im 153/275  mediastinal]
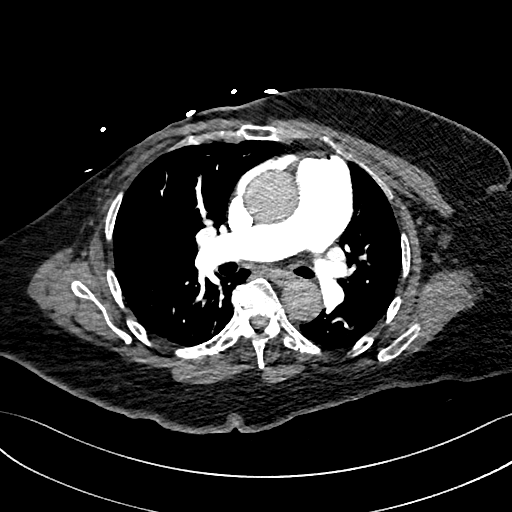
[im 168/275  lung]
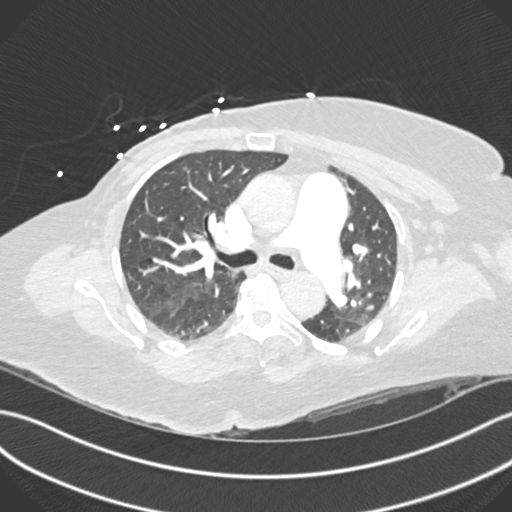
[im 183/275  mediastinal]
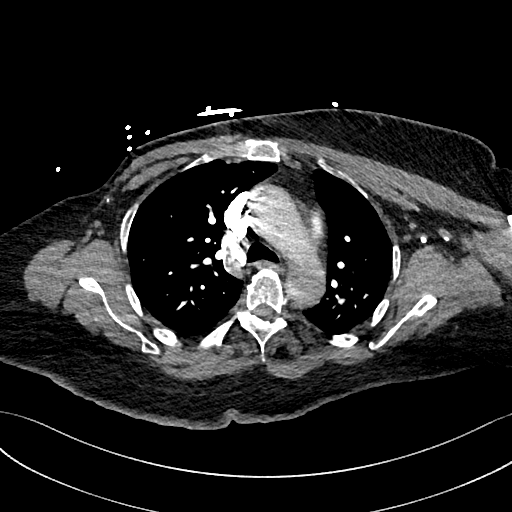
[im 198/275  lung]
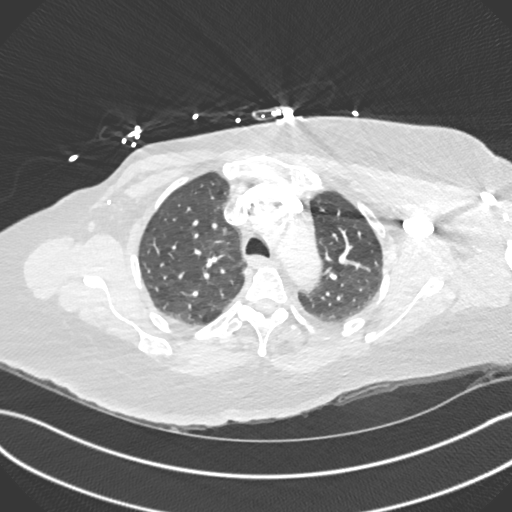
[im 214/275  mediastinal]
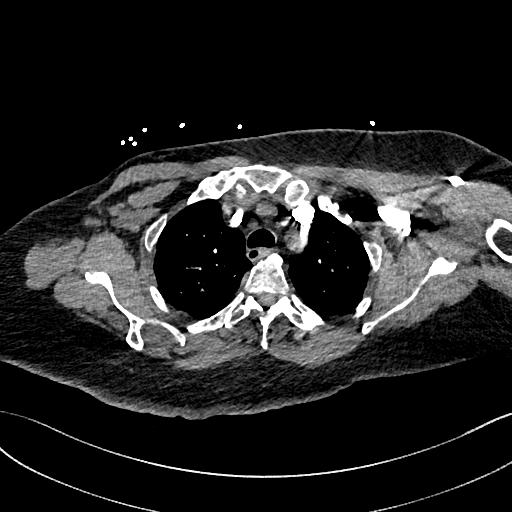
[im 229/275  lung]
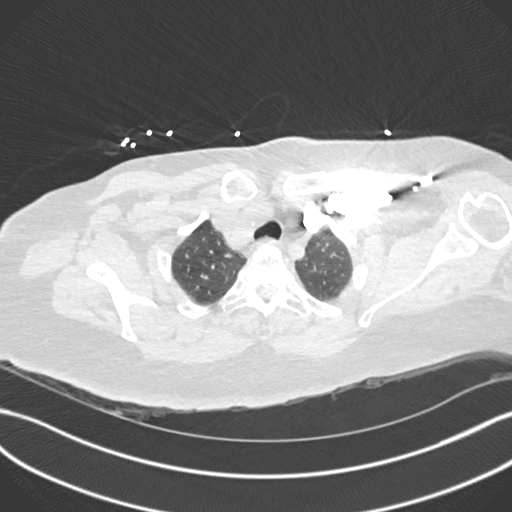
[im 244/275  mediastinal]
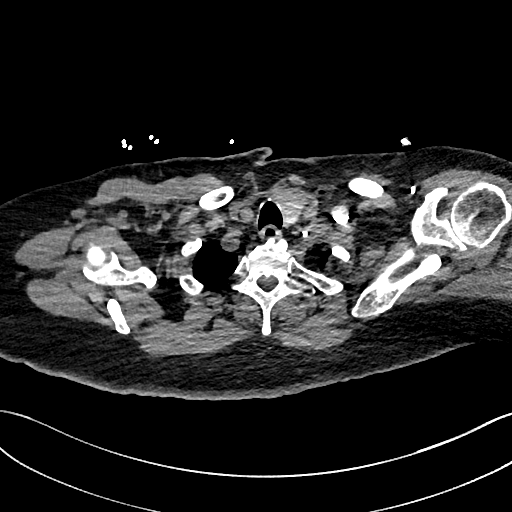
[im 259/275  lung]
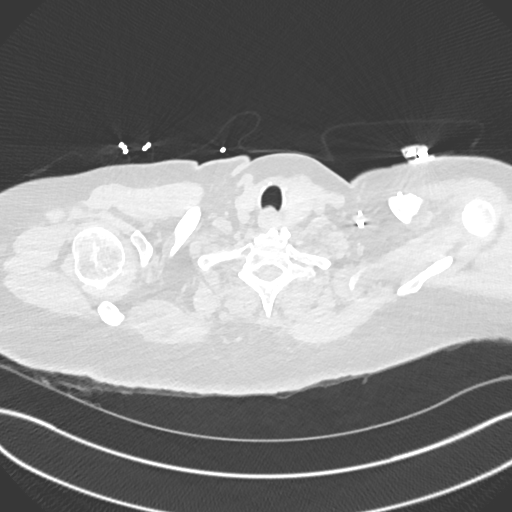

[Series 8: pe 2mm cor · coronal · 0.59mm/px · 1 of 151 slices shown]
[im 76/151  mediastinal]
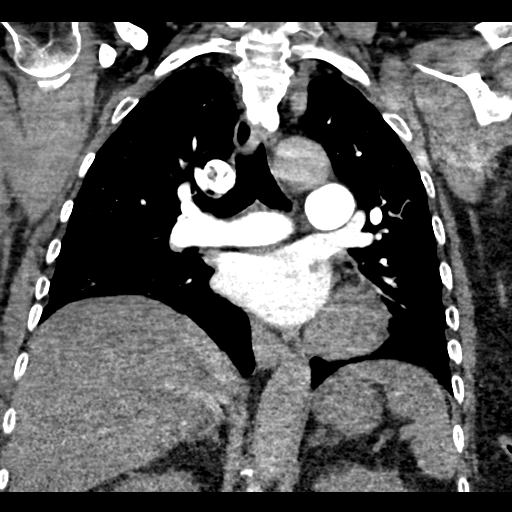

[18 of 36 positions shown; findings below may reference images not displayed]

Canned report from images found in remote index.

Refer to host system for actual result text.

## 2019-06-27 NOTE — Assessment & Plan Note (Signed)
Check labs and replete prn. 

## 2019-06-27 NOTE — Progress Notes (Signed)
69-year-old black female comes in today with complaints of back pain.  She was last seen by Dr. Donavan Burnet December 12, 2018 and he had recommended the following surgery:  Surgery recommended is a six level Thoracolumbar fusion T8 to T12, T12-L1  and L1-2 this would be done with rods, screws and cages with local bone graft and allograft (donor bone graft). Cages at the T12-L1 level and L1-2 with redo interbody fusion L2-3.  Patient also had lumbar MRI scan January 26, 2019 but had not returned to discuss those results with him prior to scheduling surgery.  That report showed:  EXAM: MRI LUMBAR SPINE WITHOUT CONTRAST  TECHNIQUE: Multiplanar, multisequence MR imaging of the lumbar spine was performed. No intravenous contrast was administered.  COMPARISON:  CT 12/05/2018.  MRI 05/28/2017.  FINDINGS: Segmentation: Same numbering terminology used previously. Previous surgery from L2-S1.  Alignment:  3 mm retrolisthesis L1-2 and L2-3.  Vertebrae: Old partial compression fracture at L2. Posterior fusion hardware from L2-S1.  Conus medullaris and cauda equina: Conus extends to the L1-2 level. Conus and cauda equina appear normal.  Paraspinal and other soft tissues: Negative except for frequently seen neurogenic changes of the posterior paraspinous musculature subsequent to the previous surgery.  Disc levels:  T10-11 and T11-12 are normal.  T12-L1: Shallow disc herniation with caudal migration as seen previously. This indents the ventral thecal sac but does not appear to cause neural compression.  L1-2: 3 mm retrolisthesis. Endplate osteophytes and broad-based calcified disc herniation with slight caudal migration. As seen previously, pedicle screws at L2 breach the superior endplate and could contribute to disc degeneration. Mild stenosis of the lateral recesses and foramina but without gross neural compression.  L2-3: 3 mm retrolisthesis. Endplate osteophytes and bulging  of the disc. Good posterior decompression with sufficient patency of the canal and foramina.  L3 to S1: Previous posterior decompression and fusion with solid union. Sufficient patency of the canal and foramina through that region.  IMPRESSION: Previous discectomy and fusion surgery from L2 to the sacrum. Solid union from L3 through S1 with sufficient patency of the canal and foramina.  At L2-3, there may be ongoing nonunion, but there does not appear to be any compressive stenosis of the canal or foramina. No unexpected bone edema. Mild spacer subsidence. Old minor compression deformity of L2.  At L1-2, there is a chronic calcified disc herniation with slight caudal migration. Pedicle screws at L2 do breach the superior endplate of L2, entering the disc space. Mild canal and foraminal narrowing but no gross neural compression or progressive changes since the previous exams.  At T12-L1, there is a chronic calcified central disc herniation with caudal migration that does not appear to cause neural compression.  I do not see a distinct explanation for changing or worsening pain.   I advised patient that she needs to have follow-up appoint with Dr. Louanne Skye to review the lumbar MRI and to discuss the extensive surgery that he mentioned in his last office note.  Patient will come back and see him next week.  I  no charged patient for today's visit.

## 2019-06-27 NOTE — Assessment & Plan Note (Signed)
Chronic problem.  Seeing Endo.  Due for microalbumin- ordered today.  Check A1C for upcoming appt

## 2019-06-27 NOTE — Assessment & Plan Note (Signed)
Pt has multiple complaints today but has specialists that she is seeing for each of her concerns.  PE WNL w/ exception of obesity.  UTD on mammo.  Declines colonoscopy.  UTD on eye exam.  UTD on immunizations.  Check labs.  Anticipatory guidance provided.

## 2019-07-03 ENCOUNTER — Other Ambulatory Visit: Payer: Self-pay

## 2019-07-03 ENCOUNTER — Encounter: Payer: Self-pay | Admitting: "Endocrinology

## 2019-07-03 ENCOUNTER — Ambulatory Visit (INDEPENDENT_AMBULATORY_CARE_PROVIDER_SITE_OTHER): Payer: Medicare HMO | Admitting: "Endocrinology

## 2019-07-03 VITALS — BP 147/80 | HR 68 | Ht 64.0 in | Wt 211.8 lb

## 2019-07-03 DIAGNOSIS — E890 Postprocedural hypothyroidism: Secondary | ICD-10-CM

## 2019-07-03 DIAGNOSIS — E1159 Type 2 diabetes mellitus with other circulatory complications: Secondary | ICD-10-CM

## 2019-07-03 DIAGNOSIS — E559 Vitamin D deficiency, unspecified: Secondary | ICD-10-CM | POA: Diagnosis not present

## 2019-07-03 DIAGNOSIS — I10 Essential (primary) hypertension: Secondary | ICD-10-CM

## 2019-07-03 LAB — POCT GLYCOSYLATED HEMOGLOBIN (HGB A1C): Hemoglobin A1C: 8.9 % — AB (ref 4.0–5.6)

## 2019-07-03 MED ORDER — GLIPIZIDE ER 5 MG PO TB24: 5.0000 mg | ORAL_TABLET | Freq: Every day | ORAL | 3 refills | Status: DC

## 2019-07-03 NOTE — Patient Instructions (Signed)

## 2019-07-03 NOTE — Progress Notes (Signed)
07/03/2019, 5:01 PM   Endocrinology follow-up note   Subjective:    Patient ID: [name], female    DOB: 1951/01/21.  [name] is being seen in follow-up for management of currently uncontrolled,complicated symptomatic type 2 diabetes and associated hypertension, hypothyroidism. PMD :  [name], MD.   Past Medical History:  Diagnosis Date  . Anginal pain ([place]) 03/19/2012   saw Dr. Cathie Olden .. she thinks its more related to stomach issues  . Arthritis    "all over; mostly in my back" (03/20/2012)  . Breast cancer ([place])    "right" (03/20/2012)  . Cardiomyopathy   . Chest pain 06/2005   Hospitalized, dystolic dysfunction  . Chronic lower back pain    "have 2 herniated discs; going to have to have a fusion" (03/20/2012)  . CKD (chronic kidney disease), stage III   . Family history of anesthesia complication    "father has nausea and vomiting"  . Gallstones   . GERD (gastroesophageal reflux disease)   . H/O hiatal hernia   . History of kidney stones   . History of right mastectomy   . Hypertension    "patient states never had HTN, takes Hyzaar for heart  . Hypothyroidism   . Irritable bowel syndrome   . Migraines    "it's been a long time since I've had one" (03/20/2012)  . Osteoarthritis   . Peripheral neuropathy   . PONV (postoperative nausea and vomiting)   . Sciatica   . Type II diabetes mellitus ([place])    Past Surgical History:  Procedure Laterality Date  . ABDOMINAL HYSTERECTOMY  1993  . adenosin cardiolite  07/2005   (+) wall motion abnormality  . AXILLARY LYMPH NODE DISSECTION  2003   squamous cell cancer  . BACK SURGERY    . CARDIAC CATHETERIZATION  ? 2005 / 2007  . CHOLECYSTECTOMY N/A 01/24/2018   Procedure: LAPAROSCOPIC CHOLECYSTECTOMY;  Surgeon: Erroll Luna, MD;  Location: [facility];  Service: General;  Laterality: N/A;  . CT abdomen and  pelvis  02/2010   Same  . ESOPHAGOGASTRODUODENOSCOPY  2005   GERD  . KNEE ARTHROSCOPY  1990's   "right" (03/20/2012)  . LUMBAR FUSION N/A 08/22/2014   Procedure: Right L2-3 and right L1-2 transforaminal lumbar interbody fusion with pedicle screws, rods, sleeves, cages, local bone graft, Vivigen, cancellous chips;  Surgeon: Jessy Oto, MD;  Location: [facility];  Service: Orthopedics;  Laterality: N/A;  . LUMBAR LAMINECTOMY/DECOMPRESSION MICRODISCECTOMY N/A 02/04/2014   Procedure: RIGHT L2-3 MICRODISCECTOMY ;  Surgeon: Jessy Oto, MD;  Location: [facility];  Service: Orthopedics;  Laterality: N/A;  . MASTECTOMY Right   . MASTECTOMY WITH AXILLARY LYMPH NODE DISSECTION  12/2003   "right" (03/20/2012)  . POSTERIOR CERVICAL FUSION/FORAMINOTOMY  2001  . SHOULDER ARTHROSCOPY W/ ROTATOR CUFF REPAIR  2003   Left  . THYROIDECTOMY  1977   Right  . TOTAL KNEE ARTHROPLASTY  2000   Right  . [place] of abdomen  07/2005   Fatty liver / kidney stones   Social History   Socioeconomic History  . Marital status: Married    Spouse name: Not on  file  . Number of children: 1  . Years of education: Not on file  . Highest education level: Not on file  Occupational History  . Occupation: Retired since 2003  Tobacco Use  . Smoking status: Never Smoker  . Smokeless tobacco: Never Used  Substance and Sexual Activity  . Alcohol use: No  . Drug use: No  . Sexual activity: Not on file  Other Topics Concern  . Not on file  Social History Narrative  . Not on file   Social Determinants of Health   Financial Resource Strain:   . Difficulty of Paying Living Expenses:   Food Insecurity:   . Worried About Charity fundraiser in the Last Year:   . Arboriculturist in the Last Year:   Transportation Needs:   . Film/video editor (Medical):   [name] Kitchen Lack of Transportation (Non-Medical):   Physical Activity:   . Days of Exercise per Week:   . Minutes of Exercise per Session:   Stress:   . Feeling of Stress :    Social Connections:   . Frequency of Communication with Friends and Family:   . Frequency of Social Gatherings with Friends and Family:   . Attends Religious Services:   . Active Member of Clubs or Organizations:   . Attends Archivist Meetings:   [name] Kitchen Marital Status:    Outpatient Encounter Medications as of 07/03/2019  Medication Sig  . acetaminophen (TYLENOL) 500 MG tablet Take 1,000 mg by mouth every 6 (six) hours as needed for moderate pain or headache.  . albuterol (VENTOLIN HFA) 108 (90 Base) MCG/ACT inhaler Inhale 2 puffs into the lungs every 6 (six) hours as needed for wheezing or shortness of breath.  . Alcohol Swabs (B-D SINGLE USE SWABS REGULAR) PADS Use as directed.  [name] Kitchen amLODipine (NORVASC) 10 MG tablet TAKE 1 TABLET BY MOUTH EVERY DAY  . aspirin 81 MG tablet Take 81 mg by mouth daily.  . Blood Glucose Calibration (TRUE METRIX LEVEL 2) Normal SOLN Use as directed  . Blood Glucose Monitoring Suppl (TRUE METRIX AIR GLUCOSE METER) w/Device KIT 1 kit by Does not apply route 3 (three) times daily.  . carvedilol (COREG) 6.25 MG tablet Take 1 tablet (6.25 mg total) by mouth 2 (two) times daily with a meal.  . diclofenac Sodium (VOLTAREN) 1 % GEL Apply 4 g topically 4 (four) times daily as needed.  . fluticasone (FLONASE) 50 MCG/ACT nasal spray Place 2 sprays into both nostrils daily. (Patient taking differently: Place 2 sprays into both nostrils daily as needed for allergies. )  . furosemide (LASIX) 40 MG tablet TAKE 1 TABLET (40 MG TOTAL) BY MOUTH DAILY AS NEEDED (LEG SWELLING).  [name] Kitchen glipiZIDE (GLUCOTROL XL) 5 MG 24 hr tablet Take 1 tablet (5 mg total) by mouth daily with breakfast.  . glucose blood (TRUE METRIX BLOOD GLUCOSE TEST) test strip Use as instructed  . insulin NPH-regular Human (70-30) 100 UNIT/ML injection Inject 60-70 Units into the skin See admin instructions. 70 units with breakfast and 60 units with supper for BG above 90  . levothyroxine (SYNTHROID) 100 MCG tablet  Take 1 tablet (100 mcg total) by mouth daily.  . pantoprazole (PROTONIX) 40 MG tablet Take 1 tablet (40 mg total) by mouth 2 (two) times daily.  . polyethylene glycol (MIRALAX / GLYCOLAX) packet Take 17 g by mouth daily as needed for moderate constipation.  . pregabalin (LYRICA) 75 MG capsule TAKE 1 CAPSULE BY MOUTH TWICE  A DAY  . rosuvastatin (CRESTOR) 20 MG tablet Take 1 tablet (20 mg total) by mouth daily at 6 PM.  . TRUEplus Lancets 33G MISC Use as directed   No facility-administered encounter medications on file as of 07/03/2019.    ALLERGIES: Allergies  Allergen Reactions  . Dilaudid [Hydromorphone Hcl] Nausea And Vomiting  . Lipitor [Atorvastatin] Other (See Comments)    Body & Muscle Aches  . Adhesive [Tape] Rash  . Ampicillin Rash and Other (See Comments)    REACTION: Rash and welts on her hands when she took it in the hospital  . Latex Itching, Dermatitis, Rash and Other (See Comments)    REACTION: Patient had a reaction to tape after surgery and they told her she was allergic to Latex   . Penicillins Rash and Other (See Comments)    Has patient had a PCN reaction causing immediate rash, facial/tongue/throat swelling, SOB or lightheadedness with hypotension: No Has patient had a PCN reaction causing severe rash involving mucus membranes or skin necrosis: No Has patient had a PCN reaction that required hospitalization: Yes - in hospital receiving treatment Has patient had a PCN reaction occurring within the last 10 years: No If all of the above answers are "NO", then may proceed with Cephalosporin use.     VACCINATION STATUS: Immunization History  Administered Date(s) Administered  . Influenza Split 01/31/2011  . Influenza Whole 02/23/2007  . Influenza, High Dose Seasonal PF 01/10/2017  . Influenza,inj,Quad PF,6+ Mos 01/07/2014, 02/09/2016, 01/01/2018, 12/03/2018  . Influenza-Unspecified 01/10/2012  . Pneumococcal Conjugate-13 05/16/2019  . Pneumococcal  Polysaccharide-23 08/25/2014  . Tdap 04/20/2011    Diabetes She presents for her follow-up diabetic visit. She has type 2 diabetes mellitus. Onset time: She was diagnosed at approximate age of 59 years. Her disease course has been improving. There are no hypoglycemic associated symptoms. Pertinent negatives for hypoglycemia include no confusion, headaches, pallor or seizures. Associated symptoms include foot ulcerations, polydipsia and polyuria. Pertinent negatives for diabetes include no blurred vision, no chest pain, no fatigue and no polyphagia. There are no hypoglycemic complications. Symptoms are improving. Diabetic complications include nephropathy and retinopathy. Risk factors for coronary artery disease include diabetes mellitus, hypertension, obesity, sedentary lifestyle and post-menopausal. Current diabetic treatment includes insulin injections (She is currently on Novolin 70/30 65 units a.m., 35 units p.m. along with metformin 850 mg p.o. twice daily.). She is following a generally unhealthy diet. When asked about meal planning, she reported none. She has not had a previous visit with a dietitian. She never participates in exercise. Her home blood glucose trend is decreasing steadily. (She did not bring any logs nor meter to review.  She denies hypoglycemia.  Her point-of-care A1c is 8.9%, improving from 10.3%.  ) An ACE inhibitor/angiotensin II receptor blocker is being taken. Eye exam is current.  Hypertension This is a chronic problem. The current episode started more than 1 year ago. The problem is uncontrolled. Pertinent negatives include no blurred vision, chest pain, headaches, palpitations or shortness of breath. Risk factors for coronary artery disease include diabetes mellitus, obesity, sedentary lifestyle, dyslipidemia and family history. Past treatments include angiotensin blockers. Hypertensive end-organ damage includes retinopathy. Identifiable causes of hypertension include a  thyroid problem.  Thyroid Problem Presents for follow-up visit. Onset time: She underwent what appears to be total thyroidectomy in her 40s for reportedly benign goiter.  Has taken thyroid hormone ever since, currently levothyroxine 100 mcg p.o. nightly. Patient reports no cold intolerance, diarrhea, fatigue, heat intolerance or palpitations.  The symptoms have been stable. Past treatments include levothyroxine. Prior procedures include thyroidectomy.     Review of systems  Constitutional: + Minimally fluctuating body weight,  current  Body mass index is 36.36 kg/m. , no fatigue, no subjective hyperthermia, no subjective hypothermia Eyes: no blurry vision, no xerophthalmia ENT: no sore throat, no nodules palpated in throat, no dysphagia/odynophagia, no hoarseness Cardiovascular: no Chest Pain, no Shortness of Breath, no palpitations, no leg swelling Respiratory: no cough, no shortness of breath Gastrointestinal: no Nausea/Vomiting/Diarhhea Musculoskeletal: Complains of back pain, awaiting for decision on surgery.  Skin: no rashes, no hyperemia Neurological: no tremors, no numbness, no tingling, no dizziness Psychiatric: no depression, no anxiety   Objective:    BP (!) 147/80   Pulse 68   Ht _0  (1.626 m)   Wt 211 lb 12.8 oz (96.1 kg)   BMI 36.36 kg/m   Wt Readings from Last 3 Encounters:  07/03/19 211 lb 12.8 oz (96.1 kg)  06/26/19 213 lb 9.6 oz (96.9 kg)  06/26/19 213 lb 9.6 oz (96.9 kg)     Physical Exam- Limited  Constitutional:  Body mass index is 36.36 kg/m. , not in acute distress, normal state of mind Eyes:  EOMI, no exophthalmos Neck: Supple Thyroid: No gross goiter Respiratory: Adequate breathing efforts Musculoskeletal: no gross deformities, strength intact in all four extremities, no gross restriction of joint movements Skin:  no rashes, no hyperemia Neurological: no tremor with outstretched hands,    CMP Latest Ref Rng & Units 04/20/2019 04/19/2019 04/18/2019   Glucose 70 - 99 mg/dL 223(H) 144(H) 183(H)  BUN 8 - 23 mg/dL 30(H) 28(H) 24(H)  Creatinine 0.44 - 1.00 mg/dL 2.07(H) 2.13(H) 2.18(H)  Sodium 135 - 145 mmol/L 138 139 141  Potassium 3.5 - 5.1 mmol/L 4.4 4.3 4.5  Chloride 98 - 111 mmol/L 105 108 109  CO2 22 - 32 mmol/L _1 Calcium 8.9 - 10.3 mg/dL 9.4 9.4 9.5  Total Protein 6.5 - 8.1 g/dL - - -  Total Bilirubin 0.3 - 1.2 mg/dL - - -  Alkaline Phos 38 - 126 U/L - - -  AST 15 - 41 U/L - - -  ALT 0 - 44 U/L - - -     Diabetic Labs (most recent): Lab Results  Component Value Date   HGBA1C 8.9 (A) 07/03/2019   HGBA1C 10.3 (H) 03/01/2019   HGBA1C 9.1 (H) 10/02/2018     Lipid Panel ( most recent) Lipid Panel     Component Value Date/Time   CHOL 193 04/17/2019 0227   TRIG 208 (H) 04/17/2019 0227   HDL 41 04/17/2019 0227   CHOLHDL 4.7 04/17/2019 0227   VLDL 42 (H) 04/17/2019 0227   LDLCALC 110 (H) 04/17/2019 0227   LDLCALC 96 06/01/2018 1525      Assessment & Plan:   1. DM type 2 causing vascular disease (HCC)  - [name] has currently uncontrolled symptomatic type 2 DM since 69 years of age. She did not bring any logs nor meter to review.  She denies hypoglycemia.  Her point-of-care A1c is 8.9%, improving from 10.3%.   Recent labs, biopsy results reviewed, showing stage 3-4 renal insufficiency.  -her diabetes is complicated by retinopathy, obesity/sedentary life, and she remains at a high risk for more acute and chronic complications which include CAD, CVA, CKD, retinopathy, and neuropathy. These are all discussed in detail with her.  - I have counseled her on diet management and weight loss, by  adopting a carbohydrate restricted/protein rich diet.  - she  admits there is a room for improvement in her diet and drink choices. -  Suggestion is made for her to avoid simple carbohydrates  from her diet including Cakes, Sweet Desserts / Pastries, Ice Cream, Soda (diet and regular), Sweet Tea, Candies,  Chips, Cookies, Sweet Pastries,  Store Bought Juices, Alcohol in Excess of  1-2 drinks a day, Artificial Sweeteners, Coffee Creamer, and "Sugar-free" Products. This will help patient to have stable blood glucose profile and potentially avoid unintended weight gain.   - I encouraged her to switch to  unprocessed or minimally processed complex starch and increased protein intake (animal or plant source), fruits, and vegetables.  - she is advised to stick to a routine mealtimes to eat 3 meals  a day and avoid unnecessary snacks ( to snack only to correct hypoglycemia).   - she is following with Jearld Fenton, RDN, CDE for individualized diabetes education.  - I have approached her with the following individualized plan to manage diabetes and patient agrees:   -She will continue to benefit from her current simplified insulin treatment regimen.  In order for her to clear for back surgery, she will need a higher dose of insulin in order for her to achieve control of diabetes to target.  -She is advised to increase Novolin 70/30 to 70 units daily with breakfast, and 60 units daily with supper  for pre-meal blood glucose readings above 90 mg/dL,  associated with strict monitoring of glucose 4 times a day-before meals and at bedtime and patient will return in 5 weeks for reevaluation.    - she is warned not to take insulin without proper monitoring per orders. - she is encouraged to call clinic for blood glucose levels less than 70 or above 300 mg /dl. -She is advised to discontinue Metformin for now.  She may benefit from low-dose glipizide intervention.  I discussed and added glipizide 5 mg XL daily at breakfast.    - Patient specific target  A1c;  LDL, HDL, Triglycerides, and  Waist Circumference were discussed in detail.  2) BP/HTN:  Her blood pressure is not controlled to target.     she is advised to continue her current medications including losartan 50 mg p.o. daily at breakfast.    3)  Lipids/HPL:   Review of her recent lipid panel showed uncontrolled  LDL at 96.  she has documented adverse reactions to Lipitor.  She will be considered for low-dose Niaspan on next visit.    4)  Weight/Diet: Her BMI 36- clearly complicating her diabetes care.  Is a candidate for modest weight loss.  I discussed with her the fact that loss of 5 - 10% of her  current body weight will have the most impact on her diabetes management.  CDE Consult will be initiated . Exercise, and detailed carbohydrates information provided  -  detailed on discharge instructions.  5) hypothyroidism-postsurgical  Her recent thyroid function tests are consistent with appropriate replacement.  She is advised to continue levothyroxine 100 mcg p.o. daily before breakfast.     - We discussed about the correct intake of her thyroid hormone, on empty stomach at fasting, with water, separated by at least 30 minutes from breakfast and other medications,  and separated by more than 4 hours from calcium, iron, multivitamins, acid reflux medications (PPIs). -Patient is made aware of the fact that thyroid hormone replacement is needed for life, dose to be adjusted by periodic  monitoring of thyroid function tests.   6) nodular goiter. -Her most recent thyroid/neck ultrasound revealed 2.9 cm nodule on the left lobe of her thyroid -She has surgically absent right thyroid lobe.  Biopsy of 2.9 cm nodule on the left lobe is benign.    She will not need any surgical intervention at this time.     7) vitamin D deficiency -She is on ongoing supplement with vitamin D3 5000 units daily.     8) Chronic Care/Health Maintenance:  -she  is on ARB and Statin medications and  is encouraged to initiate and continue to follow up with Ophthalmology, Dentist,  Podiatrist at least yearly or according to recommendations, and advised to  stay away from smoking. I have recommended yearly flu vaccine and pneumonia vaccine at least every 5 years;  moderate intensity exercise for up to 150 minutes weekly; and  sleep for at least 7 hours a day.  - I advised patient to maintain close follow up with [name], MD for primary care needs.  - Time spent on this patient care encounter:  40 min, of which > 50% was spent in  counseling and the rest reviewing her blood glucose logs , discussing her hypoglycemia and hyperglycemia episodes, reviewing her current and  previous labs / studies  ( including abstraction from other facilities) and medications  doses and developing a  long term treatment plan and documenting her care.   Please refer to Patient Instructions for Blood Glucose Monitoring and Insulin/Medications Dosing Guide"  in media tab for additional information. Please  also refer to " Patient Self Inventory" in the Media  tab for reviewed elements of pertinent patient history.  [name] participated in the discussions, expressed understanding, and voiced agreement with the above plans.  All questions were answered to her satisfaction. she is encouraged to contact clinic should she have any questions or concerns prior to her return visit.  Follow up plan: - Return in about 5 weeks (around 08/07/2019) for Bring Meter and Logs- A1c in Office, Follow up with Pre-visit Labs.  Glade Lloyd, MD [facility] [address] [place], [place] [address] Phone: [phone]  Fax: [phone]    07/03/2019, 5:01 PM  This note was partially dictated with voice recognition software. Similar sounding words can be transcribed inadequately or may not  be corrected upon review.

## 2019-07-11 ENCOUNTER — Other Ambulatory Visit: Payer: Self-pay

## 2019-07-11 ENCOUNTER — Encounter: Payer: Self-pay | Admitting: Specialist

## 2019-07-11 ENCOUNTER — Ambulatory Visit: Payer: Medicare HMO | Admitting: Specialist

## 2019-07-11 VITALS — BP 180/76 | HR 62 | Ht 64.0 in | Wt 211.0 lb

## 2019-07-11 DIAGNOSIS — M4005 Postural kyphosis, thoracolumbar region: Secondary | ICD-10-CM | POA: Diagnosis not present

## 2019-07-11 DIAGNOSIS — M5126 Other intervertebral disc displacement, lumbar region: Secondary | ICD-10-CM | POA: Diagnosis not present

## 2019-07-11 DIAGNOSIS — M960 Pseudarthrosis after fusion or arthrodesis: Secondary | ICD-10-CM | POA: Diagnosis not present

## 2019-07-11 DIAGNOSIS — I10 Essential (primary) hypertension: Secondary | ICD-10-CM

## 2019-07-11 DIAGNOSIS — T84498A Other mechanical complication of other internal orthopedic devices, implants and grafts, initial encounter: Secondary | ICD-10-CM | POA: Diagnosis not present

## 2019-07-11 DIAGNOSIS — M47812 Spondylosis without myelopathy or radiculopathy, cervical region: Secondary | ICD-10-CM

## 2019-07-11 DIAGNOSIS — M4807 Spinal stenosis, lumbosacral region: Secondary | ICD-10-CM

## 2019-07-11 DIAGNOSIS — Z981 Arthrodesis status: Secondary | ICD-10-CM

## 2019-07-11 DIAGNOSIS — M5136 Other intervertebral disc degeneration, lumbar region: Secondary | ICD-10-CM | POA: Diagnosis not present

## 2019-07-11 NOTE — Progress Notes (Signed)
Office Visit Note   [name]           Date of Birth: [birth year]           MRN: [id number] Visit Date: 07/11/2019              Requested by: [name], MD [address] [place],  [place] [address] PCP: [name], MD   Assessment & Plan: Visit Diagnoses:  1. Postural kyphosis of thoracolumbar region   2. Loosening of hardware in spine (HCC)   3. Spondylosis without myelopathy or radiculopathy, cervical region   4. Herniation of lumbar intervertebral disc   5. Other intervertebral disc degeneration, lumbar region   6. Pseudarthrosis after fusion or arthrodesis   7. History of lumbar spinal fusion   8. Spinal stenosis of lumbosacral region     Plan: Avoid bending, stooping and avoid lifting weights greater than 10 lbs. Avoid prolong standing and walking. Avoid frequent bending and stooping  No lifting greater than 10 lbs. May use ice or moist heat for pain. Weight loss is of benefit. Handicap license is approved. Dr. Romona Curls secretary/Assistant will call to arrange for epidural steroid injection  Need to control your blood pressure due to aortic dilitation, diabetes needs to be controlled with HgbA1c less than 8 to consider Surgery. Cold intolerance may relate to thyroid issue.  Surgical solution to your spine is possible but will require further extension of the fusion to the T10 level or a 4 level fusion. Follow-Up Instructions: No follow-ups on file.   Orders:  No orders of the defined types were placed in this encounter.  No orders of the defined types were placed in this encounter.     Procedures: No procedures performed   Clinical Data: Findings:  Study Result  CLINICAL DATA:  Previous lumbar fusion surgeries. Low back pain radiating to the right buttock and right leg and to the left hip. Symptoms worsening.  EXAM: MRI LUMBAR SPINE WITHOUT CONTRAST  TECHNIQUE: Multiplanar, multisequence MR imaging of the  lumbar spine was performed. No intravenous contrast was administered.  COMPARISON:  CT 12/05/2018.  MRI 05/28/2017.  FINDINGS: Segmentation: Same numbering terminology used previously. Previous surgery from L2-S1.  Alignment:  3 mm retrolisthesis L1-2 and L2-3.  Vertebrae: Old partial compression fracture at L2. Posterior fusion hardware from L2-S1.  Conus medullaris and cauda equina: Conus extends to the L1-2 level. Conus and cauda equina appear normal.  Paraspinal and other soft tissues: Negative except for frequently seen neurogenic changes of the posterior paraspinous musculature subsequent to the previous surgery.  Disc levels:  T10-11 and T11-12 are normal.  T12-L1: Shallow disc herniation with caudal migration as seen previously. This indents the ventral thecal sac but does not appear to cause neural compression.  L1-2: 3 mm retrolisthesis. Endplate osteophytes and broad-based calcified disc herniation with slight caudal migration. As seen previously, pedicle screws at L2 breach the superior endplate and could contribute to disc degeneration. Mild stenosis of the lateral recesses and foramina but without gross neural compression.  L2-3: 3 mm retrolisthesis. Endplate osteophytes and bulging of the disc. Good posterior decompression with sufficient patency of the canal and foramina.  L3 to S1: Previous posterior decompression and fusion with solid union. Sufficient patency of the canal and foramina through that region.  IMPRESSION: Previous discectomy and fusion surgery from L2 to the sacrum. Solid union from L3 through S1 with sufficient patency of the canal and foramina.  At  L2-3, there may be ongoing nonunion, but there does not appear to be any compressive stenosis of the canal or foramina. No unexpected bone edema. Mild spacer subsidence. Old minor compression deformity of L2.  At L1-2, there is a chronic calcified disc herniation with  slight caudal migration. Pedicle screws at L2 do breach the superior endplate of L2, entering the disc space. Mild canal and foraminal narrowing but no gross neural compression or progressive changes since the previous exams.  At T12-L1, there is a chronic calcified central disc herniation with caudal migration that does not appear to cause neural compression.  I do not see a distinct explanation for changing or worsening pain.   Electronically Signed   By: Nelson Chimes M.D.   On: 01/26/2019 09:10       Subjective: Chief Complaint  Patient presents with  . Lower Back - Pain    69 year old female with history of lumbar fusion surgeries L3-4 and L4-5 in 2014 with bilateral foramenotomies L5-S1 and then extension of fusion to L1-2 and L2-3 in 2016. She did well for a couple of years then it start in again. I can stand for maybe ten minutes, can't do my home chores, husband will pick up items that fall. Unable to bend, sitting increases the pain, she has to lie down to relieve the pain. She gets the cooking started and then she has to sit down. Sitting is better than standing but after awhile that hurts to. Most of the pain is in the  Back and there is radiation into the right leg and dorsal right foot. There is right anterior thigh pain and right buttock pain. The pain is worse with she can't vacuum at all but can sweep. Husband is doing the vacuuming. No bowel difficulty but constipation. She has urinary incontinence. Has not seen a specialist she has seen Dr. Kipp Brood due to chest pains, the aorta is enlarged and she also has hypertension. She is taking tylenol and a few ibuprofen. Mainly she takes tylenol. No renal disease, has diabetes, had last Hba1c of 8. The right leg want to give away. She will not go to her daughter's house due to step leading into the townhouse.    Review of Systems  Constitutional: Negative.  Negative for activity change, appetite change, chills,  diaphoresis, fatigue, fever and unexpected weight change.  HENT: Positive for sore throat, trouble swallowing and voice change. Negative for congestion, dental problem, drooling, ear discharge, ear pain, facial swelling, hearing loss, mouth sores, nosebleeds, postnasal drip, rhinorrhea, sinus pressure, sinus pain, sneezing and tinnitus.   Eyes: Negative.  Negative for photophobia, pain, discharge, redness, itching and visual disturbance.  Respiratory: Positive for shortness of breath and wheezing. Negative for apnea, cough, choking, chest tightness and stridor.   Cardiovascular: Positive for chest pain. Negative for palpitations and leg swelling.  Gastrointestinal: Positive for abdominal pain. Negative for abdominal distention, anal bleeding, blood in stool, constipation, diarrhea, nausea, rectal pain and vomiting.  Endocrine: Positive for cold intolerance. Negative for heat intolerance, polydipsia, polyphagia and polyuria.  Genitourinary: Positive for enuresis. Negative for difficulty urinating, dyspareunia, dysuria, flank pain, frequency, hematuria, pelvic pain and urgency.  Musculoskeletal: Positive for back pain and gait problem. Negative for arthralgias, joint swelling, myalgias, neck pain and neck stiffness.  Skin: Negative.  Negative for color change, pallor, rash and wound.  Allergic/Immunologic: Negative.  Negative for environmental allergies, food allergies and immunocompromised state.  Neurological: Positive for speech difficulty, weakness and numbness. Negative for  dizziness, tremors, seizures, syncope, facial asymmetry, light-headedness and headaches.  Hematological: Negative.  Negative for adenopathy. Does not bruise/bleed easily.     Objective: Vital Signs: BP (!) 180/76 (BP Location: Left Arm, Patient Position: Sitting, Cuff Size: Normal)   Pulse 62   Ht 5\' 4"  (1.626 m)   Wt 211 lb (95.7 kg)   BMI 36.22 kg/m   Physical Exam Constitutional:      Appearance: She is  well-developed.  HENT:     Head: Normocephalic and atraumatic.  Eyes:     Pupils: Pupils are equal, round, and reactive to light.  Pulmonary:     Effort: Pulmonary effort is normal.     Breath sounds: Normal breath sounds.  Abdominal:     General: Bowel sounds are normal.     Palpations: Abdomen is soft.  Musculoskeletal:     Cervical back: Normal range of motion and neck supple.     Lumbar back: Negative right straight leg raise test and negative left straight leg raise test.  Skin:    General: Skin is warm and dry.  Neurological:     Mental Status: She is alert and oriented to person, place, and time.  Psychiatric:        Behavior: Behavior normal.        Thought Content: Thought content normal.        Judgment: Judgment normal.     Back Exam   Tenderness  The patient is experiencing tenderness in the lumbar.  Range of Motion  Extension: abnormal  Flexion: abnormal  Lateral bend right: abnormal  Lateral bend left: abnormal  Rotation right: abnormal  Rotation left: abnormal   Muscle Strength  Right Quadriceps:  5/5  Left Quadriceps:  5/5  Right Hamstrings:  5/5  Left Hamstrings:  5/5   Tests  Straight leg raise right: negative Straight leg raise left: negative  Reflexes  Patellar: 2/4 Achilles: 2/4 Babinski's sign: normal   Other  Toe walk: abnormal Heel walk: abnormal Sensation: decreased Gait: abnormal  Erythema: no back redness Scars: present  Comments:  Right hip flexion 5/5. Knee extension is normal. Foot DF normal.      Specialty Comments:  No specialty comments available.  Imaging: No results found.   PMFS History: Patient Active Problem List   Diagnosis Date Noted  . Herniated nucleus pulposus, L2-3 right 02/04/2014    Priority: High    Class: Acute  . Lumbar stenosis with neurogenic claudication 04/30/2012    Priority: High    Class: Chronic  . Physical exam 06/26/2019  . Neck pain 04/18/2019  . Ascending aortic aneurysm  ([place]) 04/17/2019  . Hypoxia   . Nonspecific chest pain 04/15/2019  . Neuropathic pain of right flank 12/27/2018  . Dysphagia 12/27/2018  . Postsurgical hypothyroidism 06/11/2018  . Vitamin D deficiency 06/11/2018  . Multinodular goiter 06/11/2018  . Hypercalcemia 06/11/2018  . DM type 2 causing vascular disease ([place])   . PONV (postoperative nausea and vomiting)   . Peripheral neuropathy   . Irritable bowel syndrome   . Hypertension   . History of right mastectomy   . H/O hiatal hernia   . GERD (gastroesophageal reflux disease)   . Family history of anesthesia complication   . Diabetes mellitus type 2, uncontrolled, with complications ([place])   . Lower extremity edema   . Chronic constipation 06/23/2015  . Morbid obesity ([place]) 06/12/2015  . Chronic diastolic heart failure ([place]) 11/17/2014  . Spondylolisthesis of lumbar region 04/30/2012  Class: Chronic  . Anginal pain ([place]) 03/19/2012  . SYNCOPE-CAROTID SINUS 09/16/2008  . OSTEOARTHRITIS 08/23/2006  . DDD (degenerative disc disease), cervical 08/23/2006  . [place] DISEASE, LUMBAR 08/23/2006  . Headache(784.0) 08/23/2006  . BREAST CANCER, HX OF 08/23/2006  . HYPERPARATHYROIDISM, HX OF 08/23/2006  . RENAL CALCULUS, HX OF 08/23/2006   Past Medical History:  Diagnosis Date  . Anginal pain ([place]) 03/19/2012   saw Dr. Cathie Olden .. she thinks its more related to stomach issues  . Arthritis    "all over; mostly in my back" (03/20/2012)  . Breast cancer ([place])    "right" (03/20/2012)  . Cardiomyopathy   . Chest pain 06/2005   Hospitalized, dystolic dysfunction  . Chronic lower back pain    "have 2 herniated discs; going to have to have a fusion" (03/20/2012)  . CKD (chronic kidney disease), stage III   . Family history of anesthesia complication    "father has nausea and vomiting"  . Gallstones   . GERD (gastroesophageal reflux disease)   . H/O hiatal hernia   . History of kidney stones   . History of right mastectomy   .  Hypertension    "patient states never had HTN, takes Hyzaar for heart  . Hypothyroidism   . Irritable bowel syndrome   . Migraines    "it's been a long time since I've had one" (03/20/2012)  . Osteoarthritis   . Peripheral neuropathy   . PONV (postoperative nausea and vomiting)   . Sciatica   . Type II diabetes mellitus (HCC)     Family History  Problem Relation Age of Onset  . Hypertension Mother   . Diabetes Father   . Hypertension Father   . Hypertension Sister   . Hypertension Brother   . Hypertension Brother   . Diabetes Brother   . Diabetes Brother   . Diabetes Other        Family Hx. of Diabetes  . Hypertension Other        Family History of HTN  . Deep vein thrombosis Daughter   . Breast cancer Maternal Aunt     Past Surgical History:  Procedure Laterality Date  . ABDOMINAL HYSTERECTOMY  1993  . adenosin cardiolite  07/2005   (+) wall motion abnormality  . AXILLARY LYMPH NODE DISSECTION  2003   squamous cell cancer  . BACK SURGERY    . CARDIAC CATHETERIZATION  ? 2005 / 2007  . CHOLECYSTECTOMY N/A 01/24/2018   Procedure: LAPAROSCOPIC CHOLECYSTECTOMY;  Surgeon: Erroll Luna, MD;  Location: [facility];  Service: General;  Laterality: N/A;  . CT abdomen and pelvis  02/2010   Same  . ESOPHAGOGASTRODUODENOSCOPY  2005   GERD  . KNEE ARTHROSCOPY  1990's   "right" (03/20/2012)  . LUMBAR FUSION N/A 08/22/2014   Procedure: Right L2-3 and right L1-2 transforaminal lumbar interbody fusion with pedicle screws, rods, sleeves, cages, local bone graft, Vivigen, cancellous chips;  Surgeon: Jessy Oto, MD;  Location: [facility];  Service: Orthopedics;  Laterality: N/A;  . LUMBAR LAMINECTOMY/DECOMPRESSION MICRODISCECTOMY N/A 02/04/2014   Procedure: RIGHT L2-3 MICRODISCECTOMY ;  Surgeon: Jessy Oto, MD;  Location: [facility];  Service: Orthopedics;  Laterality: N/A;  . MASTECTOMY Right   . MASTECTOMY WITH AXILLARY LYMPH NODE DISSECTION  12/2003   "right" (03/20/2012)  . POSTERIOR  CERVICAL FUSION/FORAMINOTOMY  2001  . SHOULDER ARTHROSCOPY W/ ROTATOR CUFF REPAIR  2003   Left  . THYROIDECTOMY  1977   Right  . TOTAL KNEE  ARTHROPLASTY  2000   Right  . [place] of abdomen  07/2005   Fatty liver / kidney stones   Social History   Occupational History  . Occupation: Retired since 2003  Tobacco Use  . Smoking status: Never Smoker  . Smokeless tobacco: Never Used  Substance and Sexual Activity  . Alcohol use: No  . Drug use: No  . Sexual activity: Not on file

## 2019-07-11 NOTE — Patient Instructions (Addendum)
Plan: Avoid bending, stooping and avoid lifting weights greater than 10 lbs. Avoid prolong standing and walking. Avoid frequent bending and stooping  No lifting greater than 10 lbs. May use ice or moist heat for pain. Weight loss is of benefit. Handicap license is approved. Dr. Romona Curls secretary/Assistant will call to arrange for epidural steroid injection  Need to control your blood pressure due to aortic dilitation, diabetes needs to be controlled with HgbA1c less than 8 to consider Surgery. Cold intolerance may relate to thyroid issue.  Surgical solution to your spine is possible but will require further extension of the fusion to the T10 level or a 4 level fusion. Follow-Up Instructions: No follow-ups on file.

## 2019-07-13 ENCOUNTER — Inpatient Hospital Stay (HOSPITAL_COMMUNITY): Admission: RE | Admit: 2019-07-13 | Payer: Medicare HMO | Source: Ambulatory Visit

## 2019-07-15 ENCOUNTER — Other Ambulatory Visit (HOSPITAL_COMMUNITY)
Admission: RE | Admit: 2019-07-15 | Discharge: 2019-07-15 | Disposition: A | Discharge status: Home / Self Care | Payer: Medicare HMO | Source: Ambulatory Visit | Attending: Critical Care Medicine | Admitting: Critical Care Medicine

## 2019-07-15 DIAGNOSIS — Z01812 Encounter for preprocedural laboratory examination: Secondary | ICD-10-CM | POA: Diagnosis not present

## 2019-07-15 DIAGNOSIS — Z20822 Contact with and (suspected) exposure to covid-19: Secondary | ICD-10-CM | POA: Diagnosis not present

## 2019-07-15 LAB — SARS CORONAVIRUS 2 (TAT 6-24 HRS): SARS Coronavirus 2: NEGATIVE

## 2019-07-17 ENCOUNTER — Ambulatory Visit: Payer: Medicare HMO | Admitting: Critical Care Medicine

## 2019-07-17 ENCOUNTER — Encounter: Payer: Self-pay | Admitting: Critical Care Medicine

## 2019-07-17 ENCOUNTER — Other Ambulatory Visit: Payer: Self-pay

## 2019-07-17 ENCOUNTER — Ambulatory Visit (INDEPENDENT_AMBULATORY_CARE_PROVIDER_SITE_OTHER): Payer: Medicare HMO | Admitting: Critical Care Medicine

## 2019-07-17 VITALS — BP 120/80 | HR 57 | Ht 65.0 in | Wt 215.0 lb

## 2019-07-17 DIAGNOSIS — E6609 Other obesity due to excess calories: Secondary | ICD-10-CM | POA: Diagnosis not present

## 2019-07-17 DIAGNOSIS — R9389 Abnormal findings on diagnostic imaging of other specified body structures: Secondary | ICD-10-CM

## 2019-07-17 DIAGNOSIS — D649 Anemia, unspecified: Secondary | ICD-10-CM | POA: Diagnosis not present

## 2019-07-17 DIAGNOSIS — G4733 Obstructive sleep apnea (adult) (pediatric): Secondary | ICD-10-CM

## 2019-07-17 DIAGNOSIS — R0600 Dyspnea, unspecified: Secondary | ICD-10-CM

## 2019-07-17 DIAGNOSIS — R062 Wheezing: Secondary | ICD-10-CM

## 2019-07-17 DIAGNOSIS — R0609 Other forms of dyspnea: Secondary | ICD-10-CM

## 2019-07-17 LAB — PULMONARY FUNCTION TEST
DL/VA % pred: 93 %
DL/VA: 3.83 ml/min/mmHg/L
DLCO cor % pred: 59 %
DLCO cor: 11.99 ml/min/mmHg
DLCO unc % pred: 59 %
DLCO unc: 11.99 ml/min/mmHg
FEF 25-75 Post: 2.45 L/sec
FEF 25-75 Pre: 2.18 L/sec
FEF2575-%Change-Post: 12 %
FEF2575-%Pred-Post: 138 %
FEF2575-%Pred-Pre: 123 %
FEV1-%Change-Post: 2 %
FEV1-%Pred-Post: 81 %
FEV1-%Pred-Pre: 79 %
FEV1-Post: 1.56 L
FEV1-Pre: 1.53 L
FEV1FVC-%Change-Post: 3 %
FEV1FVC-%Pred-Pre: 112 %
FEV6-%Change-Post: 0 %
FEV6-%Pred-Post: 72 %
FEV6-%Pred-Pre: 73 %
FEV6-Post: 1.74 L
FEV6-Pre: 1.75 L
FEV6FVC-%Change-Post: 0 %
FEV6FVC-%Pred-Post: 103 %
FEV6FVC-%Pred-Pre: 103 %
FVC-%Change-Post: 0 %
FVC-%Pred-Post: 70 %
FVC-%Pred-Pre: 70 %
FVC-Post: 1.74 L
FVC-Pre: 1.75 L
Post FEV1/FVC ratio: 90 %
Post FEV6/FVC ratio: 100 %
Pre FEV1/FVC ratio: 87 %
Pre FEV6/FVC Ratio: 100 %
RV % pred: 70 %
RV: 1.58 L
TLC % pred: 67 %
TLC: 3.5 L

## 2019-07-17 LAB — CBC WITH DIFFERENTIAL/PLATELET
Basophils Absolute: 0.1 10*3/uL (ref 0.0–0.1)
Basophils Relative: 1.1 % (ref 0.0–3.0)
Eosinophils Absolute: 0.3 10*3/uL (ref 0.0–0.7)
Eosinophils Relative: 3.6 % (ref 0.0–5.0)
HCT: 38.4 % (ref 36.0–46.0)
Hemoglobin: 12.7 g/dL (ref 12.0–15.0)
Lymphocytes Relative: 31 % (ref 12.0–46.0)
Lymphs Abs: 2.7 10*3/uL (ref 0.7–4.0)
MCHC: 33.1 g/dL (ref 30.0–36.0)
MCV: 86.6 fl (ref 78.0–100.0)
Monocytes Absolute: 0.6 10*3/uL (ref 0.1–1.0)
Monocytes Relative: 6.7 % (ref 3.0–12.0)
Neutro Abs: 5 10*3/uL (ref 1.4–7.7)
Neutrophils Relative %: 57.6 % (ref 43.0–77.0)
Platelets: 310 10*3/uL (ref 150.0–400.0)
RBC: 4.43 Mil/uL (ref 3.87–5.11)
RDW: 15.7 % — ABNORMAL HIGH (ref 11.5–15.5)
WBC: 8.7 10*3/uL (ref 4.0–10.5)

## 2019-07-17 LAB — IRON: Iron: 44 ug/dL (ref 42–145)

## 2019-07-17 LAB — IBC + FERRITIN
Ferritin: 191.5 ng/mL (ref 10.0–291.0)
Iron: 44 ug/dL (ref 42–145)
Saturation Ratios: 12.4 % — ABNORMAL LOW (ref 20.0–50.0)
Transferrin: 254 mg/dL (ref 212.0–360.0)

## 2019-07-17 LAB — VITAMIN B12: Vitamin B-12: 173 pg/mL — ABNORMAL LOW (ref 211–911)

## 2019-07-17 NOTE — Progress Notes (Signed)
Synopsis: Referred in February 2021 for pulmonary hypertension by [name], MD.  Subjective:   PATIENT ID: [name]: female DOB: 29-Jan-1951, MRN: [id number]  Chief Complaint  Patient presents with  . Follow-up    PFT performed today. Pt states she believes her breathing is better since last visit and states she is not wheezing anymore.    [name] is a 69 year old woman during her recent hospitalization for evaluation of pulmonary hypertension.  Her primary focus has been on evaluation of her central chest pain.  Her cough is improved, she has minimal dry persistent cough that is worse with talking.  Her wheezing is improved.  Her chest pain is dull, central in her chest and radiating to her back, present most of the time, worse with coughing, movement, doing her PFTs, and with talking.  The pain improves with sitting still.  She was recently evaluated by orthopedics for ongoing spine issues, but they did not feel that her pain is well explained by her orthopedic issues.  She has mild heartburn and some ongoing hoarseness. Seldom uses ventolin, doesn't benefit from it.  She feels that she has no energy.  He with diagnosis of mild OSA.  She did not want to schedule her titration PSG because she does not want to use any mask or cannula device.  She dislikes using nasal cannula when she is in the hospital to claustrophobia. AHI 9.7, lowest desat 82%.   OV 05/16/19: [name] is a 69 year old woman who presents for evaluation of atypical chest pain and concern for pulmonary hypertension.  She recently was hospitalized from 04/15/19-04/20/19 after having episode of wheezing and severe shortness of breath associated with chest pain.  She had a negative cardiac evaluation at that time, but had dilated pulmonary artery on CT scan.  Echocardiogram was unable to evaluate PA pressures.  She has had ongoing chest pain, on both sides of her sternum, reproducible with  palpation.  It can be worse with activity, but sometimes occurs when she is lying flat.  It can feel like she has an elephant sitting on her chest, radiating to her back.  She develops coughing and wheezing after the pain starts.  She occasionally develops hoarseness.  Use of a heating pad and tylenol have been helpful but tums and muscle relaxers have not been helpful.  Albuterol has not helped.  Since starting twice daily PPI, she has not noticed a change in her symptoms.  She eats dinner several hours prior to going to bed, and overall has been eating less recently due to reduced appetite.  She admits to chronic pain and a sticking sensation when swallowing.  She has to drink when eating food to help it go down.  She has never been evaluated by GI in the past.  She is a never smoker.  There is no family history or personal history of lung disease. When her pain is not present she has baseline activity limitations due to chronic back pain.  She never has pulmonary symptoms without the chest pain.  She was hospitalized CT demonstrated a large pulmonary artery, concerning for pulmonary hypertension.  Cardiology recommended evaluation for sleep apnea.  She snores at night and does not wake up well rested.  She is tired throughout the day.  Stop Bang score = 5.  Up-to-date on seasonal flu and pneumococcal 23 vaccine.   Past Medical History:  Diagnosis Date  . Anginal pain ([place]) 03/19/2012   saw Dr. Cathie Olden .[name] Kitchen  she thinks its more related to stomach issues  . Arthritis    "all over; mostly in my back" (03/20/2012)  . Breast cancer ([place])    "right" (03/20/2012)  . Cardiomyopathy   . Chest pain 06/2005   Hospitalized, dystolic dysfunction  . Chronic lower back pain    "have 2 herniated discs; going to have to have a fusion" (03/20/2012)  . CKD (chronic kidney disease), stage III   . Family history of anesthesia complication    "father has nausea and vomiting"  . Gallstones   . GERD (gastroesophageal  reflux disease)   . H/O hiatal hernia   . History of kidney stones   . History of right mastectomy   . Hypertension    "patient states never had HTN, takes Hyzaar for heart  . Hypothyroidism   . Irritable bowel syndrome   . Migraines    "it's been a long time since I've had one" (03/20/2012)  . Osteoarthritis   . Peripheral neuropathy   . PONV (postoperative nausea and vomiting)   . Sciatica   . Type II diabetes mellitus (HCC)      Family History  Problem Relation Age of Onset  . Hypertension Mother   . Diabetes Father   . Hypertension Father   . Hypertension Sister   . Hypertension Brother   . Hypertension Brother   . Diabetes Brother   . Diabetes Brother   . Diabetes Other        Family Hx. of Diabetes  . Hypertension Other        Family History of HTN  . Deep vein thrombosis Daughter   . Breast cancer Maternal Aunt      Past Surgical History:  Procedure Laterality Date  . ABDOMINAL HYSTERECTOMY  1993  . adenosin cardiolite  07/2005   (+) wall motion abnormality  . AXILLARY LYMPH NODE DISSECTION  2003   squamous cell cancer  . BACK SURGERY    . CARDIAC CATHETERIZATION  ? 2005 / 2007  . CHOLECYSTECTOMY N/A 01/24/2018   Procedure: LAPAROSCOPIC CHOLECYSTECTOMY;  Surgeon: Erroll Luna, MD;  Location: [facility];  Service: General;  Laterality: N/A;  . CT abdomen and pelvis  02/2010   Same  . ESOPHAGOGASTRODUODENOSCOPY  2005   GERD  . KNEE ARTHROSCOPY  1990's   "right" (03/20/2012)  . LUMBAR FUSION N/A 08/22/2014   Procedure: Right L2-3 and right L1-2 transforaminal lumbar interbody fusion with pedicle screws, rods, sleeves, cages, local bone graft, Vivigen, cancellous chips;  Surgeon: Jessy Oto, MD;  Location: [facility];  Service: Orthopedics;  Laterality: N/A;  . LUMBAR LAMINECTOMY/DECOMPRESSION MICRODISCECTOMY N/A 02/04/2014   Procedure: RIGHT L2-3 MICRODISCECTOMY ;  Surgeon: Jessy Oto, MD;  Location: [facility];  Service: Orthopedics;  Laterality: N/A;  .  MASTECTOMY Right   . MASTECTOMY WITH AXILLARY LYMPH NODE DISSECTION  12/2003   "right" (03/20/2012)  . POSTERIOR CERVICAL FUSION/FORAMINOTOMY  2001  . SHOULDER ARTHROSCOPY W/ ROTATOR CUFF REPAIR  2003   Left  . THYROIDECTOMY  1977   Right  . TOTAL KNEE ARTHROPLASTY  2000   Right  . [place] of abdomen  07/2005   Fatty liver / kidney stones    Social History   Socioeconomic History  . Marital status: Married    Spouse name: Not on file  . Number of children: 1  . Years of education: Not on file  . Highest education level: Not on file  Occupational History  . Occupation: Retired since 2003  Tobacco Use  . Smoking status: Never Smoker  . Smokeless tobacco: Never Used  Substance and Sexual Activity  . Alcohol use: No  . Drug use: No  . Sexual activity: Not on file  Other Topics Concern  . Not on file  Social History Narrative  . Not on file   Social Determinants of Health   Financial Resource Strain:   . Difficulty of Paying Living Expenses:   Food Insecurity:   . Worried About Charity fundraiser in the Last Year:   . Arboriculturist in the Last Year:   Transportation Needs:   . Film/video editor (Medical):   [name] Kitchen Lack of Transportation (Non-Medical):   Physical Activity:   . Days of Exercise per Week:   . Minutes of Exercise per Session:   Stress:   . Feeling of Stress :   Social Connections:   . Frequency of Communication with Friends and Family:   . Frequency of Social Gatherings with Friends and Family:   . Attends Religious Services:   . Active Member of Clubs or Organizations:   . Attends Archivist Meetings:   [name] Kitchen Marital Status:   Intimate Partner Violence:   . Fear of Current or Ex-Partner:   . Emotionally Abused:   [name] Kitchen Physically Abused:   . Sexually Abused:      Allergies  Allergen Reactions  . Dilaudid [Hydromorphone Hcl] Nausea And Vomiting  . Lipitor [Atorvastatin] Other (See Comments)    Body & Muscle Aches  . Adhesive [Tape] Rash    . Ampicillin Rash and Other (See Comments)    REACTION: Rash and welts on her hands when she took it in the hospital  . Latex Itching, Dermatitis, Rash and Other (See Comments)    REACTION: Patient had a reaction to tape after surgery and they told her she was allergic to Latex   . Penicillins Rash and Other (See Comments)    Has patient had a PCN reaction causing immediate rash, facial/tongue/throat swelling, SOB or lightheadedness with hypotension: No Has patient had a PCN reaction causing severe rash involving mucus membranes or skin necrosis: No Has patient had a PCN reaction that required hospitalization: Yes - in hospital receiving treatment Has patient had a PCN reaction occurring within the last 10 years: No If all of the above answers are "NO", then may proceed with Cephalosporin use.      Immunization History  Administered Date(s) Administered  . Influenza Split 01/31/2011  . Influenza Whole 02/23/2007  . Influenza, High Dose Seasonal PF 01/10/2017  . Influenza,inj,Quad PF,6+ Mos 01/07/2014, 02/09/2016, 01/01/2018, 12/03/2018  . Influenza-Unspecified 01/10/2012  . PFIZER SARS-COV-2 Vaccination 05/24/2019, 06/18/2019  . Pneumococcal Conjugate-13 05/16/2019  . Pneumococcal Polysaccharide-23 08/25/2014  . Tdap 04/20/2011    Outpatient Medications Prior to Visit  Medication Sig Dispense Refill  . acetaminophen (TYLENOL) 500 MG tablet Take 1,000 mg by mouth every 6 (six) hours as needed for moderate pain or headache.    . albuterol (VENTOLIN HFA) 108 (90 Base) MCG/ACT inhaler Inhale 2 puffs into the lungs every 6 (six) hours as needed for wheezing or shortness of breath. 8 g 0  . Alcohol Swabs (B-D SINGLE USE SWABS REGULAR) PADS Use as directed. 100 each 1  . amLODipine (NORVASC) 10 MG tablet TAKE 1 TABLET BY MOUTH EVERY DAY 90 tablet 3  . aspirin 81 MG tablet Take 81 mg by mouth daily.    . Blood Glucose Calibration (TRUE METRIX LEVEL 2) Normal  SOLN Use as directed 3 each 3   . Blood Glucose Monitoring Suppl (TRUE METRIX AIR GLUCOSE METER) w/Device KIT 1 kit by Does not apply route 3 (three) times daily. 1 kit 0  . carvedilol (COREG) 6.25 MG tablet Take 1 tablet (6.25 mg total) by mouth 2 (two) times daily with a meal. 180 tablet 1  . diclofenac Sodium (VOLTAREN) 1 % GEL Apply 4 g topically 4 (four) times daily as needed. 100 g 0  . fluticasone (FLONASE) 50 MCG/ACT nasal spray Place 2 sprays into both nostrils daily. (Patient taking differently: Place 2 sprays into both nostrils daily as needed for allergies. ) 48 g 3  . furosemide (LASIX) 40 MG tablet TAKE 1 TABLET (40 MG TOTAL) BY MOUTH DAILY AS NEEDED (LEG SWELLING). 90 tablet 1  . glipiZIDE (GLUCOTROL XL) 5 MG 24 hr tablet Take 1 tablet (5 mg total) by mouth daily with breakfast. 30 tablet 3  . glucose blood (TRUE METRIX BLOOD GLUCOSE TEST) test strip Use as instructed 100 each 12  . insulin NPH-regular Human (70-30) 100 UNIT/ML injection Inject 60-70 Units into the skin See admin instructions. 70 units with breakfast and 60 units with supper for BG above 90    . levothyroxine (SYNTHROID) 100 MCG tablet Take 1 tablet (100 mcg total) by mouth daily. 90 tablet 1  . pantoprazole (PROTONIX) 40 MG tablet Take 1 tablet (40 mg total) by mouth 2 (two) times daily. 180 tablet 1  . polyethylene glycol (MIRALAX / GLYCOLAX) packet Take 17 g by mouth daily as needed for moderate constipation.    . pregabalin (LYRICA) 75 MG capsule TAKE 1 CAPSULE BY MOUTH TWICE A DAY 60 capsule 3  . rosuvastatin (CRESTOR) 20 MG tablet Take 1 tablet (20 mg total) by mouth daily at 6 PM. 30 tablet 0  . TRUEplus Lancets 33G MISC Use as directed 300 each 3   No facility-administered medications prior to visit.    Review of Systems  Constitutional:       Weight gain, poor appetite  Respiratory: Positive for cough, shortness of breath and wheezing.   Cardiovascular: Positive for chest pain and leg swelling.  Gastrointestinal: Positive for  nausea.       Indigestion, sticking feeling when swallowing  Musculoskeletal: Positive for back pain.     Objective:   Vitals:   07/17/19 1029  BP: 120/80  Pulse: (!) 57  SpO2: 98%  Weight: 215 lb (97.5 kg)  Height: '5\' 5"'  (1.651 m)   98% on   RA BMI Readings from Last 3 Encounters:  07/17/19 35.78 kg/m  07/11/19 36.22 kg/m  07/03/19 36.36 kg/m   Wt Readings from Last 3 Encounters:  07/17/19 215 lb (97.5 kg)  07/11/19 211 lb (95.7 kg)  07/03/19 211 lb 12.8 oz (96.1 kg)    Physical Exam Vitals reviewed.  Constitutional:      Appearance: Normal appearance. She is obese. She is not ill-appearing.  HENT:     Head: Normocephalic and atraumatic.  Eyes:     General: No scleral icterus. Cardiovascular:     Rate and Rhythm: Normal rate and regular rhythm.     Heart sounds: No murmur.  Pulmonary:     Comments: Breathing comfortably on room air, no conversational dyspnea.  Clear to auscultation bilaterally. Abdominal:     General: There is no distension.     Palpations: Abdomen is soft.     Tenderness: There is no abdominal tenderness.  Musculoskeletal:  General: No swelling or deformity.     Cervical back: Neck supple.  Lymphadenopathy:     Cervical: No cervical adenopathy.  Skin:    General: Skin is warm and dry.     Findings: No rash.  Neurological:     General: No focal deficit present.     Coordination: Coordination normal.  Psychiatric:        Mood and Affect: Mood normal.        Behavior: Behavior normal.      CBC    Component Value Date/Time   WBC 8.7 07/17/2019 1111   RBC 4.43 07/17/2019 1111   HGB 12.7 07/17/2019 1111   HGB 13.8 06/11/2010 1001   HCT 38.4 07/17/2019 1111   HCT 41.9 06/11/2010 1001   PLT 310.0 07/17/2019 1111   PLT 234 06/11/2010 1001   MCV 86.6 07/17/2019 1111   MCV 88.6 06/11/2010 1001   MCH 28.9 04/19/2019 0458   MCHC 33.1 07/17/2019 1111   RDW 15.7 (H) 07/17/2019 1111   RDW 14.1 06/11/2010 1001   LYMPHSABS 2.7  07/17/2019 1111   LYMPHSABS 2.3 06/11/2010 1001   MONOABS 0.6 07/17/2019 1111   MONOABS 0.5 06/11/2010 1001   EOSABS 0.3 07/17/2019 1111   EOSABS 0.2 06/11/2010 1001   EOSABS 0.2 08/14/2009 1400   BASOSABS 0.1 07/17/2019 1111   BASOSABS 0.1 06/11/2010 1001    CHEMISTRY No results for input(s): NA, K, CL, CO2, GLUCOSE, BUN, CREATININE, CALCIUM, MG, PHOS in the last 168 hours. CrCl cannot be calculated (Patient's most recent lab result is older than the maximum 21 days allowed.).   Chest Imaging- films reviewed: CTA chest 04/15/2019- mosaicism throughout the lungs, excessive airway collapse from the upper trachea to the mainstem bronchi bilaterally.  Dilated esophagus.  A sending aortic aneurysm, very large pulmonary artery.  Dilated pulmonary veins.  No PE.  No significant mediastinal or hilar adenopathy.  Pulmonary Functions Testing Results: PFT Results Latest Ref Rng & Units 07/17/2019  FVC-Pre L 1.75  FVC-Predicted Pre % 70  FVC-Post L 1.74  FVC-Predicted Post % 70  Pre FEV1/FVC % % 87  Post FEV1/FCV % % 90  FEV1-Pre L 1.53  FEV1-Predicted Pre % 79  FEV1-Post L 1.56  DLCO UNC% % 59  DLCO COR %Predicted % 93  TLC L 3.50  TLC % Predicted % 67  RV % Predicted % 70    2021: No significant obstruction or bronchodilator reversibility.  Mild restriction.  Mild diffusion impairment.  Echocardiogram 04/17/2019: LVEF 55 to 60%, severe LVH, asymmetric hypertrophy with thickened basilar septum.  Indeterminate diastolic function.  Normal LA, mildly enlarged RV with normal function, normal RA.  Mild MR, trivial PR.  Normal PASP.  Mild dilation of the ascending aorta.  NM SPECT scan 04/17/2019: Normal myocardial perfusion, low risk study.  Baseline hypertension with hypertensive response to exercise.  Reach 70% heart rate.  No ST changes during stress, frequent PVCs.    Assessment & Plan:     ICD-10-CM   1. Anemia, unspecified type  D64.9 IBC + Ferritin    CBC w/Diff    Iron    Iron  Binding Cap (TIBC)    B12    Methylmalonic Acid    Methylmalonic Acid    B12    Iron Binding Cap (TIBC)    Iron    CBC w/Diff    IBC + Ferritin  2. Dyspnea on exertion  R06.00 IBC + Ferritin    CBC w/Diff  Iron    Iron Binding Cap (TIBC)    B12    Methylmalonic Acid    Methylmalonic Acid    B12    Iron Binding Cap (TIBC)    Iron    CBC w/Diff    IBC + Ferritin  3. Obesity due to excess calories with serious comorbidity, unspecified classification  E66.09   4. OSA (obstructive sleep apnea)  G47.33      Abnormal CT demonstrating enlarged PA concerning for pulmonary hypertension.  At risk for WHO group II hypertension due to left-sided heart disease, but lack of LA dilation suggest this is less likely.  Sleep apnea predisposes to WHO group III pulmonary hypertension. -Split-night polysomnogram reviewed.  We discussed options for less invasive NIPPV devices, but she does not want to try them.  She does not want to have a titration study. -PFTs reviewed   Acute anemia-unclear etiology -Iron studies and repeat CBC--no role for iron supplements. -B12 and methylmalonic acid.  GERD, dysphagia -Continue PPI twice daily -Has appointment with GI next week   RTC in 3 months.    Current Outpatient Medications:  .  acetaminophen (TYLENOL) 500 MG tablet, Take 1,000 mg by mouth every 6 (six) hours as needed for moderate pain or headache., Disp: , Rfl:  .  albuterol (VENTOLIN HFA) 108 (90 Base) MCG/ACT inhaler, Inhale 2 puffs into the lungs every 6 (six) hours as needed for wheezing or shortness of breath., Disp: 8 g, Rfl: 0 .  Alcohol Swabs (B-D SINGLE USE SWABS REGULAR) PADS, Use as directed., Disp: 100 each, Rfl: 1 .  amLODipine (NORVASC) 10 MG tablet, TAKE 1 TABLET BY MOUTH EVERY DAY, Disp: 90 tablet, Rfl: 3 .  aspirin 81 MG tablet, Take 81 mg by mouth daily., Disp: , Rfl:  .  Blood Glucose Calibration (TRUE METRIX LEVEL 2) Normal SOLN, Use as directed, Disp: 3 each, Rfl: 3 .   Blood Glucose Monitoring Suppl (TRUE METRIX AIR GLUCOSE METER) w/Device KIT, 1 kit by Does not apply route 3 (three) times daily., Disp: 1 kit, Rfl: 0 .  carvedilol (COREG) 6.25 MG tablet, Take 1 tablet (6.25 mg total) by mouth 2 (two) times daily with a meal., Disp: 180 tablet, Rfl: 1 .  diclofenac Sodium (VOLTAREN) 1 % GEL, Apply 4 g topically 4 (four) times daily as needed., Disp: 100 g, Rfl: 0 .  fluticasone (FLONASE) 50 MCG/ACT nasal spray, Place 2 sprays into both nostrils daily. (Patient taking differently: Place 2 sprays into both nostrils daily as needed for allergies. ), Disp: 48 g, Rfl: 3 .  furosemide (LASIX) 40 MG tablet, TAKE 1 TABLET (40 MG TOTAL) BY MOUTH DAILY AS NEEDED (LEG SWELLING)., Disp: 90 tablet, Rfl: 1 .  glipiZIDE (GLUCOTROL XL) 5 MG 24 hr tablet, Take 1 tablet (5 mg total) by mouth daily with breakfast., Disp: 30 tablet, Rfl: 3 .  glucose blood (TRUE METRIX BLOOD GLUCOSE TEST) test strip, Use as instructed, Disp: 100 each, Rfl: 12 .  insulin NPH-regular Human (70-30) 100 UNIT/ML injection, Inject 60-70 Units into the skin See admin instructions. 70 units with breakfast and 60 units with supper for BG above 90, Disp: , Rfl:  .  levothyroxine (SYNTHROID) 100 MCG tablet, Take 1 tablet (100 mcg total) by mouth daily., Disp: 90 tablet, Rfl: 1 .  pantoprazole (PROTONIX) 40 MG tablet, Take 1 tablet (40 mg total) by mouth 2 (two) times daily., Disp: 180 tablet, Rfl: 1 .  polyethylene glycol (MIRALAX / GLYCOLAX) packet, Take  17 g by mouth daily as needed for moderate constipation., Disp: , Rfl:  .  pregabalin (LYRICA) 75 MG capsule, TAKE 1 CAPSULE BY MOUTH TWICE A DAY, Disp: 60 capsule, Rfl: 3 .  rosuvastatin (CRESTOR) 20 MG tablet, Take 1 tablet (20 mg total) by mouth daily at 6 PM., Disp: 30 tablet, Rfl: 0 .  TRUEplus Lancets 33G MISC, Use as directed, Disp: 300 each, Rfl: 3    Julian Hy, DO [facility] 07/17/2019 7:22 PM

## 2019-07-17 NOTE — Progress Notes (Signed)
PFT done today. 

## 2019-07-17 NOTE — Patient Instructions (Addendum)
Thank you for visiting Dr. Carlis Abbott at [facility]. We recommend the following: Orders Placed This Encounter  Procedures  . IBC + Ferritin  . CBC w/Diff  . Iron  . Iron Binding Cap (TIBC)  . B12  . Methylmalonic Acid   Orders Placed This Encounter  Procedures  . IBC + Ferritin    Standing Status:   Future    Standing Expiration Date:   07/16/2020  . CBC w/Diff    Standing Status:   Future    Standing Expiration Date:   07/16/2020  . Iron    Standing Status:   Future    Standing Expiration Date:   07/16/2020  . Iron Binding Cap (TIBC)    Standing Status:   Future    Standing Expiration Date:   07/16/2020  . B12    Standing Status:   Future    Standing Expiration Date:   07/16/2020  . Methylmalonic Acid    Standing Status:   Future    Standing Expiration Date:   07/16/2020    No orders of the defined types were placed in this encounter.   Return in about 3 months (around 10/16/2019).    Please do your part to reduce the spread of COVID-19.

## 2019-07-21 LAB — IRON, TOTAL/TOTAL IRON BINDING CAP
%SAT: 14 % (calc) — ABNORMAL LOW (ref 16–45)
Iron: 43 ug/dL — ABNORMAL LOW (ref 45–160)
TIBC: 310 mcg/dL (calc) (ref 250–450)

## 2019-07-21 LAB — METHYLMALONIC ACID, SERUM: Methylmalonic Acid, Quant: 264 nmol/L (ref 87–318)

## 2019-07-22 ENCOUNTER — Telehealth: Payer: Self-pay | Admitting: Family Medicine

## 2019-07-22 NOTE — Progress Notes (Signed)
  Chronic Care Management   Note  07/22/2019 Name: [name] MRN: [id number] DOB: [birth year]  [name] is a 69 y.o. year old female who is a primary care patient of Birdie Riddle, Aundra Millet, MD. I reached out to [name] by phone today in response to a referral sent by [name]'s PCP, [name], MD.   [name] was given information about Chronic Care Management services today including:  1. CCM service includes personalized support from designated clinical staff supervised by her physician, including individualized plan of care and coordination with other care providers 2. 24/7 contact phone numbers for assistance for urgent and routine care needs. 3. Service will only be billed when office clinical staff spend 20 minutes or more in a month to coordinate care. 4. Only one practitioner may furnish and bill the service in a calendar month. 5. The patient may stop CCM services at any time (effective at the end of the month) by phone call to the office staff.   Patient agreed to services and verbal consent obtained.   Follow up plan:   Earney [place] Upstream Scheduler

## 2019-07-24 ENCOUNTER — Telehealth: Payer: Self-pay | Admitting: Critical Care Medicine

## 2019-07-24 ENCOUNTER — Ambulatory Visit: Payer: Medicare HMO | Admitting: Internal Medicine

## 2019-07-24 ENCOUNTER — Encounter: Payer: Self-pay | Admitting: Internal Medicine

## 2019-07-24 VITALS — BP 140/68 | HR 72 | Temp 98.2°F | Ht 64.0 in | Wt 210.0 lb

## 2019-07-24 DIAGNOSIS — R1310 Dysphagia, unspecified: Secondary | ICD-10-CM | POA: Diagnosis not present

## 2019-07-24 DIAGNOSIS — K5909 Other constipation: Secondary | ICD-10-CM | POA: Diagnosis not present

## 2019-07-24 DIAGNOSIS — R1319 Other dysphagia: Secondary | ICD-10-CM

## 2019-07-24 DIAGNOSIS — K219 Gastro-esophageal reflux disease without esophagitis: Secondary | ICD-10-CM | POA: Diagnosis not present

## 2019-07-24 MED ORDER — FERROUS GLUCONATE 324 (38 FE) MG PO TABS: 324.0000 mg | ORAL_TABLET | Freq: Two times a day (BID) | ORAL | 3 refills | Status: DC

## 2019-07-24 NOTE — Progress Notes (Signed)
HISTORY OF PRESENT ILLNESS:  [name] is a 69 y.o. female with multiple medical problems who was sent today by her pulmonologist regarding GERD and dysphagia.  I saw the patient March 02, 2018 regarding costochondritis/musculoskeletal chest wall pain.  Also GERD without alarm features.  Previous GI care elsewhere with [facility] including upper endoscopy in 2012 and colonoscopy in 2011.  Both exams were unremarkable.  Upper endoscopy normal.  Colonoscopy diverticulosis only.  Patient presents today with complaint of dysphagia.  She feels like possibly food moves down slowly.  Even saliva.  Feels thick.  She does take daily PPI in the form of pantoprazole.  She will take an acids if she has chest pain.  Hospitalized for wheezing.  Diagnosed with sleep apnea for which CPAP was recommended.  She is not interested.  Also has chronic stable constipation for which she is taken different agents.  Took MiraLAX for 3 days.  Did not work.  Dulcolax did not work.  Magnesium citrate works some.  She wonders about other recommendations.  Review of blood work from January 2021 shows creatinine 2.07.  CBC that same month with hemoglobin 10.0.  Abdominal ultrasound September 2019 with multiple gallstones and gallbladder inflammation for which she underwent cholecystectomy.  She has completed her Covid vaccination series  REVIEW OF SYSTEMS:  All non-GI ROS negative unless otherwise stated in the HPI except for bilateral hip pain, back pain, urinary frequency  Past Medical History:  Diagnosis Date  . Anginal pain ([place]) 03/19/2012   saw Dr. Cathie Olden .. she thinks its more related to stomach issues  . Arthritis    "all over; mostly in my back" (03/20/2012)  . Breast cancer ([place])    "right" (03/20/2012)  . Cardiomyopathy   . Chest pain 06/2005   Hospitalized, dystolic dysfunction  . Chronic lower back pain    "have 2 herniated discs; going to have to have a fusion" (03/20/2012)  . CKD (chronic kidney  disease), stage III   . Family history of anesthesia complication    "father has nausea and vomiting"  . Gallstones   . GERD (gastroesophageal reflux disease)   . H/O hiatal hernia   . History of kidney stones   . History of right mastectomy   . Hypertension    "patient states never had HTN, takes Hyzaar for heart  . Hypothyroidism   . Irritable bowel syndrome   . Migraines    "it's been a long time since I've had one" (03/20/2012)  . Osteoarthritis   . Peripheral neuropathy   . PONV (postoperative nausea and vomiting)   . Sciatica   . Type II diabetes mellitus ([place])     Past Surgical History:  Procedure Laterality Date  . ABDOMINAL HYSTERECTOMY  1993  . adenosin cardiolite  07/2005   (+) wall motion abnormality  . AXILLARY LYMPH NODE DISSECTION  2003   squamous cell cancer  . BACK SURGERY    . CARDIAC CATHETERIZATION  ? 2005 / 2007  . CHOLECYSTECTOMY N/A 01/24/2018   Procedure: LAPAROSCOPIC CHOLECYSTECTOMY;  Surgeon: Erroll Luna, MD;  Location: [facility];  Service: General;  Laterality: N/A;  . CT abdomen and pelvis  02/2010   Same  . ESOPHAGOGASTRODUODENOSCOPY  2005   GERD  . KNEE ARTHROSCOPY  1990's   "right" (03/20/2012)  . LUMBAR FUSION N/A 08/22/2014   Procedure: Right L2-3 and right L1-2 transforaminal lumbar interbody fusion with pedicle screws, rods, sleeves, cages, local bone graft, Vivigen, cancellous chips;  Surgeon: Jeneen Rinks  Howell Pringle, MD;  Location: [facility];  Service: Orthopedics;  Laterality: N/A;  . LUMBAR LAMINECTOMY/DECOMPRESSION MICRODISCECTOMY N/A 02/04/2014   Procedure: RIGHT L2-3 MICRODISCECTOMY ;  Surgeon: Jessy Oto, MD;  Location: [facility];  Service: Orthopedics;  Laterality: N/A;  . MASTECTOMY Right   . MASTECTOMY WITH AXILLARY LYMPH NODE DISSECTION  12/2003   "right" (03/20/2012)  . POSTERIOR CERVICAL FUSION/FORAMINOTOMY  2001  . SHOULDER ARTHROSCOPY W/ ROTATOR CUFF REPAIR  2003   Left  . THYROIDECTOMY  1977   Right  . TOTAL KNEE ARTHROPLASTY  2000    Right  . [place] of abdomen  07/2005   Fatty liver / kidney stones    Social History [name]  reports that she has never smoked. She has never used smokeless tobacco. She reports that she does not drink alcohol or use drugs.  family history includes Breast cancer in her maternal aunt; Deep vein thrombosis in her daughter; Diabetes in her brother, brother, father, and another family member; Hypertension in her brother, brother, father, mother, sister, and another family member.  Allergies  Allergen Reactions  . Dilaudid [Hydromorphone Hcl] Nausea And Vomiting  . Lipitor [Atorvastatin] Other (See Comments)    Body & Muscle Aches  . Adhesive [Tape] Rash  . Ampicillin Rash and Other (See Comments)    REACTION: Rash and welts on her hands when she took it in the hospital  . Latex Itching, Dermatitis, Rash and Other (See Comments)    REACTION: Patient had a reaction to tape after surgery and they told her she was allergic to Latex   . Penicillins Rash and Other (See Comments)    Has patient had a PCN reaction causing immediate rash, facial/tongue/throat swelling, SOB or lightheadedness with hypotension: No Has patient had a PCN reaction causing severe rash involving mucus membranes or skin necrosis: No Has patient had a PCN reaction that required hospitalization: Yes - in hospital receiving treatment Has patient had a PCN reaction occurring within the last 10 years: No If all of the above answers are "NO", then may proceed with Cephalosporin use.        PHYSICAL EXAMINATION: Vital signs: BP 140/68   Pulse 72   Temp 98.2 F (36.8 C)   Ht 5\' 4"  (1.626 m)   Wt 210 lb (95.3 kg)   BMI 36.05 kg/m   Constitutional: Obese, generally well-appearing, no acute distress Psychiatric: alert and oriented x3, cooperative Eyes: extraocular movements intact, anicteric, conjunctiva pink Mouth: oral pharynx moist, no lesions Neck: supple no lymphadenopathy Cardiovascular: heart  regular rate and rhythm, no murmur Lungs: clear to auscultation bilaterally Abdomen: soft, nontender, nondistended, no obvious ascites, no peritoneal signs, normal bowel sounds, no organomegaly Rectal: Omitted Extremities: no clubbing, cyanosis, or lower extremity edema bilaterally Skin: no lesions on visible extremities Neuro: No focal deficits.  Cranial nerves intact  ASSESSMENT:  1.  GERD.  Classic symptoms for the most part controlled with pantoprazole 40 mg daily 2.  Questionable esophageal dysphagia. 3.  Morbid obesity 4.  Functional constipation 5.  Prior colonoscopy with diverticulosis   PLAN:  1.  Reflux precautions with attention to weight loss 2.  Continue pantoprazole 40 mg daily 3.  Schedule upper endoscopy with possible esophageal dilation.  The patient is high risk given her body habitus and insulin requiring diabetes mellitus.The nature of the procedure, as well as the risks, benefits, and alternatives were carefully and thoroughly reviewed with the patient. Ample time for discussion and questions allowed. The patient  understood, was satisfied, and agreed to proceed. 4.  Resume MiraLAX for chronic constipation.  Told to take 2 doses daily for 1 week.  If no results increase by 1 dose each week.  Eventually, optimal dose should be identified. 5.  Ongoing general medical care with PCP

## 2019-07-24 NOTE — Telephone Encounter (Signed)
Low iron levels. Starting iron supplements BID with meals.  Julian Hy, DO 07/24/19 8:22 PM [facility]

## 2019-07-24 NOTE — Patient Instructions (Signed)
You have been scheduled for an endoscopy. Please follow written instructions given to you at your visit today. If you use inhalers (even only as needed), please bring them with you on the day of your procedure.   

## 2019-07-25 NOTE — Telephone Encounter (Signed)
ATC patient per DPR left detailed message about iron supplements being sent into pharmacy.  Nothing further needed at this time.

## 2019-08-02 ENCOUNTER — Ambulatory Visit: Payer: Medicare HMO | Admitting: Radiology

## 2019-08-02 ENCOUNTER — Encounter: Payer: Self-pay | Admitting: Specialist

## 2019-08-02 ENCOUNTER — Other Ambulatory Visit: Payer: Self-pay

## 2019-08-02 ENCOUNTER — Other Ambulatory Visit: Payer: Self-pay | Admitting: General Practice

## 2019-08-02 VITALS — BP 151/70 | HR 52 | Ht 64.0 in | Wt 211.0 lb

## 2019-08-02 DIAGNOSIS — M5126 Other intervertebral disc displacement, lumbar region: Secondary | ICD-10-CM

## 2019-08-02 DIAGNOSIS — E1165 Type 2 diabetes mellitus with hyperglycemia: Secondary | ICD-10-CM

## 2019-08-02 DIAGNOSIS — M4316 Spondylolisthesis, lumbar region: Secondary | ICD-10-CM

## 2019-08-02 DIAGNOSIS — I10 Essential (primary) hypertension: Secondary | ICD-10-CM

## 2019-08-02 DIAGNOSIS — IMO0002 Reserved for concepts with insufficient information to code with codable children: Secondary | ICD-10-CM

## 2019-08-02 NOTE — Progress Notes (Deleted)
No charge visit as patient has not had her injection with Dr. [name], this was scheduled today while she was in the office and she will follow up with Dr. Louanne Skye in approx 2 week after the injection

## 2019-08-05 ENCOUNTER — Encounter: Payer: Self-pay | Admitting: Radiology

## 2019-08-05 ENCOUNTER — Telehealth: Payer: Self-pay

## 2019-08-05 ENCOUNTER — Other Ambulatory Visit: Payer: Self-pay | Admitting: Family Medicine

## 2019-08-05 DIAGNOSIS — E1159 Type 2 diabetes mellitus with other circulatory complications: Secondary | ICD-10-CM | POA: Diagnosis not present

## 2019-08-05 NOTE — Telephone Encounter (Signed)
Patient states that Dr. Birdie Riddle wanted her to have some blood work drawn a few weeks ago. Patient states that she placed the paperwork in a book and just found the paperwork roday. Patient would like to know would she need new paperwork work to have the blood work completed.

## 2019-08-05 NOTE — Telephone Encounter (Signed)
Patient aware that labs are not expired. States she is getting labs done today

## 2019-08-06 LAB — COMPREHENSIVE METABOLIC PANEL
AG Ratio: 1.1 (calc) (ref 1.0–2.5)
ALT: 11 U/L (ref 6–29)
AST: 16 U/L (ref 10–35)
Albumin: 3.5 g/dL — ABNORMAL LOW (ref 3.6–5.1)
Alkaline phosphatase (APISO): 61 U/L (ref 37–153)
BUN/Creatinine Ratio: 16 (calc) (ref 6–22)
BUN: 25 mg/dL (ref 7–25)
CO2: 24 mmol/L (ref 20–32)
Calcium: 10.5 mg/dL — ABNORMAL HIGH (ref 8.6–10.4)
Chloride: 111 mmol/L — ABNORMAL HIGH (ref 98–110)
Creat: 1.55 mg/dL — ABNORMAL HIGH (ref 0.60–0.93)
Globulin: 3.1 g/dL (calc) (ref 1.9–3.7)
Glucose, Bld: 129 mg/dL (ref 65–139)
Potassium: 4.2 mmol/L (ref 3.5–5.3)
Sodium: 142 mmol/L (ref 135–146)
Total Bilirubin: 0.3 mg/dL (ref 0.2–1.2)
Total Protein: 6.6 g/dL (ref 6.1–8.1)

## 2019-08-06 LAB — HEMOGLOBIN A1C
Hgb A1c MFr Bld: 8.4 % of total Hgb — ABNORMAL HIGH (ref <5.7)
Mean Plasma Glucose: 194 (calc)
eAG (mmol/L): 10.8 (calc)

## 2019-08-06 LAB — HEPATIC FUNCTION PANEL
AG Ratio: 1.1 (calc) (ref 1.0–2.5)
ALT: 11 U/L (ref 6–29)
AST: 16 U/L (ref 10–35)
Albumin: 3.5 g/dL — ABNORMAL LOW (ref 3.6–5.1)
Alkaline phosphatase (APISO): 61 U/L (ref 37–153)
Bilirubin, Direct: 0.1 mg/dL (ref 0.0–0.2)
Globulin: 3.1 g/dL (calc) (ref 1.9–3.7)
Indirect Bilirubin: 0.2 mg/dL (calc) (ref 0.2–1.2)
Total Bilirubin: 0.3 mg/dL (ref 0.2–1.2)
Total Protein: 6.6 g/dL (ref 6.1–8.1)

## 2019-08-06 LAB — CBC WITH DIFFERENTIAL/PLATELET
Absolute Monocytes: 538 cells/uL (ref 200–950)
Basophils Absolute: 70 cells/uL (ref 0–200)
Basophils Relative: 1.1 %
Eosinophils Absolute: 250 cells/uL (ref 15–500)
Eosinophils Relative: 3.9 %
HCT: 35.4 % (ref 35.0–45.0)
Hemoglobin: 11.3 g/dL — ABNORMAL LOW (ref 11.7–15.5)
Lymphs Abs: 2362 cells/uL (ref 850–3900)
MCH: 28.1 pg (ref 27.0–33.0)
MCHC: 31.9 g/dL — ABNORMAL LOW (ref 32.0–36.0)
MCV: 88.1 fL (ref 80.0–100.0)
MPV: 9.8 fL (ref 7.5–12.5)
Monocytes Relative: 8.4 %
Neutro Abs: 3181 cells/uL (ref 1500–7800)
Neutrophils Relative %: 49.7 %
Platelets: 291 10*3/uL (ref 140–400)
RBC: 4.02 10*6/uL (ref 3.80–5.10)
RDW: 14.4 % (ref 11.0–15.0)
Total Lymphocyte: 36.9 %
WBC: 6.4 10*3/uL (ref 3.8–10.8)

## 2019-08-06 LAB — TSH: TSH: 1.18 mIU/L (ref 0.40–4.50)

## 2019-08-06 LAB — LIPID PANEL
Cholesterol: 155 mg/dL (ref <200)
HDL: 41 mg/dL — ABNORMAL LOW (ref >=50)
LDL Cholesterol (Calc): 79 mg/dL (calc)
Non-HDL Cholesterol (Calc): 114 mg/dL (calc) (ref <130)
Total CHOL/HDL Ratio: 3.8 (calc) (ref <5.0)
Triglycerides: 269 mg/dL — ABNORMAL HIGH (ref <150)

## 2019-08-06 LAB — MICROALBUMIN / CREATININE URINE RATIO
Creatinine, Urine: 168 mg/dL (ref 20–275)
Microalb Creat Ratio: 3395 mcg/mg creat — ABNORMAL HIGH (ref <30)
Microalb, Ur: 570.4 mg/dL

## 2019-08-07 ENCOUNTER — Encounter: Payer: Self-pay | Admitting: Internal Medicine

## 2019-08-08 ENCOUNTER — Encounter: Payer: Self-pay | Admitting: Radiology

## 2019-08-08 ENCOUNTER — Other Ambulatory Visit: Payer: Self-pay

## 2019-08-08 DIAGNOSIS — E1165 Type 2 diabetes mellitus with hyperglycemia: Secondary | ICD-10-CM

## 2019-08-08 DIAGNOSIS — IMO0002 Reserved for concepts with insufficient information to code with codable children: Secondary | ICD-10-CM

## 2019-08-08 DIAGNOSIS — E1159 Type 2 diabetes mellitus with other circulatory complications: Secondary | ICD-10-CM

## 2019-08-08 NOTE — Progress Notes (Unsigned)
No charge visit as patient has not had her injection with Dr. [name], this was scheduled today while she was in the office and she will follow up with Dr. Louanne Skye in approx 2 week after the injection

## 2019-08-09 ENCOUNTER — Encounter: Payer: Self-pay | Admitting: Internal Medicine

## 2019-08-09 ENCOUNTER — Ambulatory Visit (AMBULATORY_SURGERY_CENTER): Payer: Medicare HMO | Admitting: Internal Medicine

## 2019-08-09 ENCOUNTER — Other Ambulatory Visit: Payer: Self-pay

## 2019-08-09 ENCOUNTER — Ambulatory Visit: Payer: Medicare HMO | Admitting: "Endocrinology

## 2019-08-09 VITALS — BP 185/66 | HR 50 | Temp 97.7°F | Resp 19 | Ht 64.0 in | Wt 210.0 lb

## 2019-08-09 DIAGNOSIS — R1310 Dysphagia, unspecified: Secondary | ICD-10-CM

## 2019-08-09 DIAGNOSIS — D649 Anemia, unspecified: Secondary | ICD-10-CM | POA: Diagnosis not present

## 2019-08-09 DIAGNOSIS — K219 Gastro-esophageal reflux disease without esophagitis: Secondary | ICD-10-CM

## 2019-08-09 DIAGNOSIS — R1319 Other dysphagia: Secondary | ICD-10-CM

## 2019-08-09 MED ORDER — SODIUM CHLORIDE 0.9 % IV SOLN
500.0000 mL | Freq: Once | INTRAVENOUS | Status: DC
Start: 2019-08-09 — End: 2019-12-13

## 2019-08-09 NOTE — Progress Notes (Signed)
Vitals- CW Temp- LC 

## 2019-08-09 NOTE — Patient Instructions (Signed)
Impression/Recommendations: ? ?Resume previous diet. ?Continue present medications. ? ?YOU HAD AN ENDOSCOPIC PROCEDURE TODAY AT THE [facility]:   Refer to the procedure report that was given to you for any specific questions about what was found during the examination.  If the procedure report does not answer your questions, please call your gastroenterologist to clarify.  If you requested that your care partner not be given the details of your procedure findings, then the procedure report has been included in a sealed envelope for you to review at your convenience later. ? ?YOU SHOULD EXPECT: Some feelings of bloating in the abdomen. Passage of more gas than usual.  Walking can help get rid of the air that was put into your GI tract during the procedure and reduce the bloating. If you had a lower endoscopy (such as a colonoscopy or flexible sigmoidoscopy) you may notice spotting of blood in your stool or on the toilet paper. If you underwent a bowel prep for your procedure, you may not have a normal bowel movement for a few days. ? ?Please Note:  You might notice some irritation and congestion in your nose or some drainage.  This is from the oxygen used during your procedure.  There is no need for concern and it should clear up in a day or so. ? ?SYMPTOMS TO REPORT IMMEDIATELY: ?Following upper endoscopy (EGD) ? Vomiting of blood or coffee ground material ? New chest pain or pain under the shoulder blades ? Painful or persistently difficult swallowing ? New shortness of breath ? Fever of 100?F or higher ? Black, tarry-looking stools ? ?For urgent or emergent issues, a gastroenterologist can be reached at any hour by calling [phone]. ?Do not use MyChart messaging for urgent concerns.  ? ? ?DIET:  We do recommend a small meal at first, but then you may proceed to your regular diet.  Drink plenty of fluids but you should avoid alcoholic beverages for 24 hours. ? ?ACTIVITY:  You should plan to take  it easy for the rest of today and you should NOT DRIVE or use heavy machinery until tomorrow (because of the sedation medicines used during the test).   ? ?FOLLOW UP: ?Our staff will call the number listed on your records 48-72 hours following your procedure to check on you and address any questions or concerns that you may have regarding the information given to you following your procedure. If we do not reach you, we will leave a message.  We will attempt to reach you two times.  During this call, we will ask if you have developed any symptoms of COVID 19. If you develop any symptoms (ie: fever, flu-like symptoms, shortness of breath, cough etc.) before then, please call [phone].  If you test positive for Covid 19 in the 2 weeks post procedure, please call and report this information to us.   ? ?If any biopsies were taken you will be contacted by phone or by letter within the next 1-3 weeks.  Please call us at [phone] if you have not heard about the biopsies in 3 weeks.  ? ? ?SIGNATURES/CONFIDENTIALITY: ?You and/or your care partner have signed paperwork which will be entered into your electronic medical record.  These signatures attest to the fact that that the information above on your After Visit Summary has been reviewed and is understood.  Full responsibility of the confidentiality of this discharge information lies with you and/or your care-partner.  ?

## 2019-08-09 NOTE — Op Note (Signed)
[facility] Patient Name: [name] Procedure Date: 08/09/2019 10:08 AM MRN: [id number] Endoscopist: Docia Chuck. Henrene Pastor , MD Age: 69 Referring MD:  Date of Birth: 1951/03/04 Gender: Female Account #: [id number] Procedure:                Upper GI endoscopy Indications:              Dysphagia, Esophageal reflux Medicines:                Monitored Anesthesia Care Procedure:                Pre-Anesthesia Assessment:                           - Prior to the procedure, a History and Physical                            was performed, and patient medications and                            allergies were reviewed. The patient's tolerance of                            previous anesthesia was also reviewed. The risks                            and benefits of the procedure and the sedation                            options and risks were discussed with the patient.                            All questions were answered, and informed consent                            was obtained. Prior Anticoagulants: The patient has                            taken no previous anticoagulant or antiplatelet                            agents. ASA Grade Assessment: II - A patient with                            mild systemic disease. After reviewing the risks                            and benefits, the patient was deemed in                            satisfactory condition to undergo the procedure.                           After obtaining informed consent, the endoscope was  passed under direct vision. Throughout the                            procedure, the patient's blood pressure, pulse, and                            oxygen saturations were monitored continuously. The                            Endoscope was introduced through the mouth, and                            advanced to the second part of duodenum. The upper                            GI endoscopy was  accomplished without difficulty.                            The patient tolerated the procedure well. Scope In: Scope Out: Findings:                 The esophagus was normal.                           The stomach was normal.                           The examined duodenum was normal.                           The cardia and gastric fundus were normal on                            retroflexion. Complications:            No immediate complications. Estimated Blood Loss:     Estimated blood loss: none. Impression:               - Normal esophagus.                           - Normal stomach.                           - Normal examined duodenum.                           - No specimens collected. Recommendation:           - Patient has a contact number available for                            emergencies. The signs and symptoms of potential                            delayed complications were discussed with the  patient. Return to normal activities tomorrow.                            Written discharge instructions were provided to the                            patient.                           - Resume previous diet.                           - Continue present medications. Docia Chuck. Henrene Pastor, MD 08/09/2019 10:42:29 AM This report has been signed electronically.

## 2019-08-09 NOTE — Progress Notes (Signed)
A/ox3, pleased with MAC, report to RN 

## 2019-08-12 ENCOUNTER — Telehealth: Payer: Self-pay | Admitting: Family Medicine

## 2019-08-12 NOTE — Telephone Encounter (Signed)
Pt called in asking if Dr. Birdie Riddle could call her in a medication for a rash that is on her bra line ( not under her breast but around the rib area). She states that it started on the left side and has moved to right side. I did make pt aware that she may need to be seen about this in office, she wanted to start this way since Dr. Birdie Riddle doesn't have any open appts until Wednesday.

## 2019-08-12 NOTE — Telephone Encounter (Signed)
Please advise 

## 2019-08-13 ENCOUNTER — Telehealth: Payer: Self-pay

## 2019-08-13 ENCOUNTER — Telehealth: Payer: Self-pay | Admitting: *Deleted

## 2019-08-13 MED ORDER — CLOTRIMAZOLE-BETAMETHASONE 1-0.05 % EX CREA: 1.0000 "application " | TOPICAL_CREAM | Freq: Two times a day (BID) | CUTANEOUS | 1 refills | Status: DC

## 2019-08-13 NOTE — Telephone Encounter (Signed)
Ok for Lotrisone cream, apply twice daily, 90grams, 1 refill

## 2019-08-13 NOTE — Telephone Encounter (Signed)
LVM

## 2019-08-13 NOTE — Telephone Encounter (Signed)
  Follow up Call-  Call back number 08/09/2019  Post procedure Call Back phone  # [phone]  Permission to leave phone message Yes  Some recent data might be hidden     Patient questions:  Do you have a fever, pain , or abdominal swelling? No. Pain Score  0 *  Have you tolerated food without any problems? Yes.    Have you been able to return to your normal activities? Yes.    Do you have any questions about your discharge instructions: Diet   No. Medications  No. Follow up visit  No.  Do you have questions or concerns about your Care? No.  Actions: * If pain score is 4 or above: No action needed, pain <4.

## 2019-08-13 NOTE — Telephone Encounter (Signed)
Medication filled to pharmacy as requested.   

## 2019-08-14 ENCOUNTER — Telehealth (INDEPENDENT_AMBULATORY_CARE_PROVIDER_SITE_OTHER): Payer: Medicare HMO | Admitting: Physician Assistant

## 2019-08-14 ENCOUNTER — Telehealth: Payer: Self-pay | Admitting: *Deleted

## 2019-08-14 ENCOUNTER — Ambulatory Visit: Payer: Medicare HMO

## 2019-08-14 ENCOUNTER — Other Ambulatory Visit: Payer: Self-pay

## 2019-08-14 ENCOUNTER — Encounter: Payer: Self-pay | Admitting: Physician Assistant

## 2019-08-14 VITALS — BP 170/67 | HR 60 | Temp 97.3°F

## 2019-08-14 DIAGNOSIS — I5032 Chronic diastolic (congestive) heart failure: Secondary | ICD-10-CM

## 2019-08-14 DIAGNOSIS — R5382 Chronic fatigue, unspecified: Secondary | ICD-10-CM | POA: Diagnosis not present

## 2019-08-14 DIAGNOSIS — E559 Vitamin D deficiency, unspecified: Secondary | ICD-10-CM | POA: Diagnosis not present

## 2019-08-14 DIAGNOSIS — I10 Essential (primary) hypertension: Secondary | ICD-10-CM

## 2019-08-14 DIAGNOSIS — E118 Type 2 diabetes mellitus with unspecified complications: Secondary | ICD-10-CM

## 2019-08-14 DIAGNOSIS — M5137 Other intervertebral disc degeneration, lumbosacral region: Secondary | ICD-10-CM | POA: Diagnosis not present

## 2019-08-14 DIAGNOSIS — E1165 Type 2 diabetes mellitus with hyperglycemia: Secondary | ICD-10-CM

## 2019-08-14 DIAGNOSIS — M2550 Pain in unspecified joint: Secondary | ICD-10-CM | POA: Diagnosis not present

## 2019-08-14 DIAGNOSIS — IMO0002 Reserved for concepts with insufficient information to code with codable children: Secondary | ICD-10-CM

## 2019-08-14 NOTE — Patient Instructions (Addendum)
Visit Information  Goals Addressed            This Visit's Progress   . A1C <7.5%       CARE PLAN ENTRY Current Barriers:  . Diabetes: type 2; complicated by chronic medical conditions including high blood pressure, chronic kidney disease. Lab Results  Component Value Date   HGBA1C 8.4 (H) 08/05/2019 .   Lab Results  Component Value Date   CREATININE 1.55 (H) 08/05/2019   CREATININE 2.07 (H) 04/20/2019   CREATININE 2.13 (H) 04/19/2019   Current diabetes regimen:  Glipizide XL 5 mg once daily    Insulin regular 70-3070 units with breakfast and 60 units with supper for BG above 90 . Denies hypoglycemic symptoms, including dizziness, lightheadedness, shaking, sweating  Pharmacist Clinical Goal(s):  [name] Kitchen Over the next 180 days, patient will work with PharmD and primary care provider to address diabetes. o A1c goal <7.5%2  Interventions: . Comprehensive medication review performed, medication list updated in electronic medical record . Inter-disciplinary care team collaboration  Patient Self Care Activities:  . Patient will check blood glucose daily , document, and provide at future appointments . Patient will take medications as prescribed . Patient will contact provider with any episodes of hypoglycemia . Patient will report any questions or concerns to provider  Initial goal documentation.    . BP <140/90       CARE PLAN ENTRY Current Barriers:  . Uncontrolled hypertension, complicated by type 2 diabetes . Current high blood pressure regimen:   Carvedilol 6.25 mg twice daily with meal (replaces losartan)  Furosemide 40 mg as needed  Amlodipine 10 mg daily  . Previous antihypertensives tried:   Losartan 50 mg once daily (held due to kidney injury, replaced by carvedilol)  . Last practice recorded BP readings:  BP Readings from Last 3 Encounters:  08/14/19 (!) 170/67  08/09/19 (!) 185/66  08/02/19 (!) 151/70  Pharmacist Clinical Goal(s):  [name] Kitchen Over the next 180  days, patient will work with PharmD and providers to optimize antihypertensive regimen o BP goal <140/90 Interventions: . Inter-disciplinary care team collaboration . Comprehensive medication review performed; medication list updated in the electronic medical record.  Patient Self Care Activities:  . Patient will continue to check blood pressure. Monitor blood pressure various times throughout the day each day - document, and provide at future appointments Initial goal documentation.       [name] was given information about Chronic Care Management services today including:  1. CCM service includes personalized support from designated clinical staff supervised by her physician, including individualized plan of care and coordination with other care providers 2. 24/7 contact phone numbers for assistance for urgent and routine care needs. 3. Standard insurance, coinsurance, copays and deductibles apply for chronic care management only during months in which we provide at least 20 minutes of these services. Most insurances cover these services at 100%, however patients may be responsible for any copay, coinsurance and/or deductible if applicable. This service may help you avoid the need for more expensive face-to-face services. 4. Only one practitioner may furnish and bill the service in a calendar month. 5. The patient may stop CCM services at any time (effective at the end of the month) by phone call to the office staff.  Patient agreed to services and verbal consent obtained.   The patient verbalized understanding of instructions provided today and agreed to receive a mailed copy of patient instruction and/or educational materials. Telephone follow up appointment with pharmacy team  member scheduled for: See next appointment with "Care Management Staff" under "What's Next" below.   Thank you!  [name], Pharm.D. Clinical Pharmacist [name] [phone]  Hypertension, Adult High blood pressure (hypertension) is when the force of blood pumping through the arteries is too strong. The arteries are the blood vessels that carry blood from the heart throughout the body. Hypertension forces the heart to work harder to pump blood and may cause arteries to become narrow or stiff. Untreated or uncontrolled hypertension can cause a heart attack, heart failure, a stroke, kidney disease, and other problems. A blood pressure reading consists of a higher number over a lower number. Ideally, your blood pressure should be below 120/80. The first ("top") number is called the systolic pressure. It is a measure of the pressure in your arteries as your heart beats. The second ("bottom") number is called the diastolic pressure. It is a measure of the pressure in your arteries as the heart relaxes. What are the causes? The exact cause of this condition is not known. There are some conditions that result in or are related to high blood pressure. What increases the risk? Some risk factors for high blood pressure are under your control. The following factors may make you more likely to develop this condition:  Smoking.  Having type 2 diabetes mellitus, high cholesterol, or both.  Not getting enough exercise or physical activity.  Being overweight.  Having too much fat, sugar, calories, or salt (sodium) in your diet.  Drinking too much alcohol. Some risk factors for high blood pressure may be difficult or impossible to change. Some of these factors include:  Having chronic kidney disease.  Having a family history of high blood pressure.  Age. Risk increases with age.  Race. You may be at higher risk if you are African American.  Gender. Men are at higher risk than women before age 30. After age 25, women are at higher risk than men.  Having obstructive sleep apnea.  Stress. What are the signs or symptoms? High blood pressure may not cause  symptoms. Very high blood pressure (hypertensive crisis) may cause:  Headache.  Anxiety.  Shortness of breath.  Nosebleed.  Nausea and vomiting.  Vision changes.  Severe chest pain.  Seizures. How is this diagnosed? This condition is diagnosed by measuring your blood pressure while you are seated, with your arm resting on a flat surface, your legs uncrossed, and your feet flat on the floor. The cuff of the blood pressure monitor will be placed directly against the skin of your upper arm at the level of your heart. It should be measured at least twice using the same arm. Certain conditions can cause a difference in blood pressure between your right and left arms. Certain factors can cause blood pressure readings to be lower or higher than normal for a short period of time:  When your blood pressure is higher when you are in a health care provider's office than when you are at home, this is called white coat hypertension. Most people with this condition do not need medicines.  When your blood pressure is higher at home than when you are in a health care provider's office, this is called masked hypertension. Most people with this condition may need medicines to control blood pressure. If you have a high blood pressure reading during one visit or you have normal blood pressure with other risk factors, you may be asked to:  Return on  a different day to have your blood pressure checked again.  Monitor your blood pressure at home for 1 week or longer. If you are diagnosed with hypertension, you may have other blood or imaging tests to help your health care provider understand your overall risk for other conditions. How is this treated? This condition is treated by making healthy lifestyle changes, such as eating healthy foods, exercising more, and reducing your alcohol intake. Your health care provider may prescribe medicine if lifestyle changes are not enough to get your blood pressure under  control, and if:  Your systolic blood pressure is above 130.  Your diastolic blood pressure is above 80. Your personal target blood pressure may vary depending on your medical conditions, your age, and other factors. Follow these instructions at home: Eating and drinking   Eat a diet that is high in fiber and potassium, and low in sodium, added sugar, and fat. An example eating plan is called the DASH (Dietary Approaches to Stop Hypertension) diet. To eat this way: ? Eat plenty of fresh fruits and vegetables. Try to fill one half of your plate at each meal with fruits and vegetables. ? Eat whole grains, such as whole-wheat pasta, brown rice, or whole-grain bread. Fill about one fourth of your plate with whole grains. ? Eat or drink low-fat dairy products, such as skim milk or low-fat yogurt. ? Avoid fatty cuts of meat, processed or cured meats, and poultry with skin. Fill about one fourth of your plate with lean proteins, such as fish, chicken without skin, beans, eggs, or tofu. ? Avoid pre-made and processed foods. These tend to be higher in sodium, added sugar, and fat.  Reduce your daily sodium intake. Most people with hypertension should eat less than 1,500 mg of sodium a day.  Do not drink alcohol if: ? Your health care provider tells you not to drink. ? You are pregnant, may be pregnant, or are planning to become pregnant.  If you drink alcohol: ? Limit how much you use to:  0-1 drink a day for women.  0-2 drinks a day for men. ? Be aware of how much alcohol is in your drink. In the [place] one drink equals one 12 oz bottle of beer (355 mL), one 5 oz glass of wine (148 mL), or one 1 oz glass of hard liquor (44 mL). Lifestyle   Work with your health care provider to maintain a healthy body weight or to lose weight. Ask what an ideal weight is for you.  Get at least 30 minutes of exercise most days of the week. Activities may include walking, swimming, or biking.  Include  exercise to strengthen your muscles (resistance exercise), such as Pilates or lifting weights, as part of your weekly exercise routine. Try to do these types of exercises for 30 minutes at least 3 days a week.  Do not use any products that contain nicotine or tobacco, such as cigarettes, e-cigarettes, and chewing tobacco. If you need help quitting, ask your health care provider.  Monitor your blood pressure at home as told by your health care provider.  Keep all follow-up visits as told by your health care provider. This is important. Medicines  Take over-the-counter and prescription medicines only as told by your health care provider. Follow directions carefully. Blood pressure medicines must be taken as prescribed.  Do not skip doses of blood pressure medicine. Doing this puts you at risk for problems and can make the medicine less effective.  Ask  your health care provider about side effects or reactions to medicines that you should watch for. Contact a health care provider if you:  Think you are having a reaction to a medicine you are taking.  Have headaches that keep coming back (recurring).  Feel dizzy.  Have swelling in your ankles.  Have trouble with your vision. Get help right away if you:  Develop a severe headache or confusion.  Have unusual weakness or numbness.  Feel faint.  Have severe pain in your chest or abdomen.  Vomit repeatedly.  Have trouble breathing. Summary  Hypertension is when the force of blood pumping through your arteries is too strong. If this condition is not controlled, it may put you at risk for serious complications.  Your personal target blood pressure may vary depending on your medical conditions, your age, and other factors. For most people, a normal blood pressure is less than 120/80.  Hypertension is treated with lifestyle changes, medicines, or a combination of both. Lifestyle changes include losing weight, eating a healthy,  low-sodium diet, exercising more, and limiting alcohol. This information is not intended to replace advice given to you by your health care provider. Make sure you discuss any questions you have with your health care provider. Document Revised: 12/06/2017 Document Reviewed: 12/06/2017 Elsevier Patient Education  2020 [employer].

## 2019-08-14 NOTE — Progress Notes (Signed)
I have discussed the procedure for the virtual visit with the patient who has given consent to proceed with assessment and treatment.   Kayal Mula N Casin Federici, LPN    . 

## 2019-08-14 NOTE — Telephone Encounter (Signed)
PT had called back yesterday after call back had been made with the husband regarding the patient's post op call back stating that she had been having pain. Called the patient back today with no answer and unable to leave voicemail.

## 2019-08-14 NOTE — Progress Notes (Signed)
**Note Desiree-Identified via Obfuscation** Virtual Visit via Video Note  I connected with [name] on 08/14/19 at  1:00 PM EDT by a video enabled telemedicine application and verified that I am speaking with the correct person using two identifiers.  Location: Patient: Home Provider: [id number]   I discussed the limitations of evaluation and management by telemedicine and the availability of in person appointments. The patient expressed understanding and agreed to proceed.  History of Present Illness: Patient with PMH significant for DM II uncontrolled with CKD and Neuropathy, HTN, Diastolic HF, HLD, ACD, AAA, chronic back pain with lumbar disc disease, chronic constipation, OSA (untreated) presents via phone today to discuss multiple ongoing issues. This is this proivider's first interaction with the patient. Is followed by Dr. Annye Asa here at [facility].  Patient endorses ongoing fatigue over the past several weeks with some increased somnolence. Denies fever, chills, sweats. Has chronic body aches 2/2 arthritis, lumbar disc herniation at L2-3 and lumbar spoldylolisthesis for which she is followed by Orthopedics. Denies any worsening of this with recent symptoms. Did note some abdominal pain Monday night, noting generalized abdominal cramping with some nausea. Has history of GERd, dysphagia and chronic constipation, followed by Gastroenterology, currently on a regimen of Protonix 40 mg BID which she has not been taking. Notes starting when symptoms were severe and noted improvement. Denies any recurrent abdominal pain or nausea. Denies change to urinary habits. Denies PND, leg swelling or orthopnea. Has had mild headache. BP today at 170/67 but she has not taken her BP medications yet today. Denies chest pain, racing heart, LH or dizziness.    Observations/Objective: Patient is well-developed, well-nourished in no acute distress.  Resting comfortably at home.  Head is normocephalic, atraumatic.  No labored  breathing.  Speech is clear and coherent with logical content.  Patient is alert and oriented at baseline.    Assessment and Plan: 1. Chronic fatigue Likely multifactorial. Will check labs to further assess. Follow-up with PCP to discuss mood.  - CBC with Differential/Platelet; Future - CRP High sensitivity; Future - Antinuclear Antib (ANA); Future - Vitamin D (25 hydroxy); Future  2. Chronic diastolic heart failure ([place]) Will recheck BNP giving her fatigue and mild windedness noted. She has also been instructed to schedule follow-up with her Cardiologist.  - B Nat Peptide; Future  3. Vitamin D deficiency + history. Likely contributing to fatigue. Will repeat level to further assess.  - Vitamin D (25 hydroxy); Future  4. DISC DISEASE, LUMBAR 5. Arthralgia of multiple joints Ongoing issue, followed by Ortho. Long-standing history of arthritis and lumbar disc disease. Will check CRP, ANA and RF to rule out other contributing causes giving her concerns.  - CRP High sensitivity; Future - Antinuclear Antib (ANA); Future - Rheumatoid Factor; Future    Follow Up Instructions:  I discussed the assessment and treatment plan with the patient. The patient was provided an opportunity to ask questions and all were answered. The patient agreed with the plan and demonstrated an understanding of the instructions.   The patient was advised to call back or seek an in-person evaluation if the symptoms worsen or if the condition fails to improve as anticipated.  I provided 40 minutes of non-face-to-face time during this encounter.   Leeanne Rio, PA-C

## 2019-08-14 NOTE — Progress Notes (Signed)
**Note Desiree-Identified via Obfuscation** [facility]  Name: [name]  MRN: [id number] DOB: 31-Aug-1950  Chief Complaint/ HPI  [name],  69 y.o. , female presents for their Initial CCM visit with the clinical pharmacist via telephone due to COVID-19 Pandemic.  PCP : Desiree Minium, MD  Their chronic conditions include: Type 2 DM, HF, hyperparathyroidism, HLD, HTN, GERD  Office Visits: 4/14 (Dr. Henrene Pratt, Gastroenterology): c/o dysphagia, endoscopy scheduled. Continued protonix 40 mg daily, take miralax 2 doses daily for 1 week then increase by 1 dose each week    4/7 (Dr. Carlis Pratt, Pulmonology): 2021: No significant obstruction or bronchodilator reversibility.  Mild restriction.  Mild diffusion impairment. Abnormal CT demonstrating enlarged PA concerning for pulmonary hypertension.  At risk for WHO group II hypertension due to left-sided heart disease, but lack of LA dilation suggest this is less likely.  Sleep apnea predisposes to WHO group III pulmonary hypertension. Does not want to use CPAP.  Outpatient Encounter Medications as of 08/14/2019  Medication Sig  . acetaminophen (TYLENOL) 500 MG tablet Take 1,000 mg by mouth every 6 (six) hours as needed for moderate pain or headache.  . albuterol (VENTOLIN HFA) 108 (90 Base) MCG/ACT inhaler Inhale 2 puffs into the lungs every 6 (six) hours as needed for wheezing or shortness of breath.  [name] Kitchen amLODipine (NORVASC) 10 MG tablet TAKE 1 TABLET BY MOUTH EVERY DAY  . aspirin 81 MG tablet Take 81 mg by mouth daily.  . Blood Glucose Calibration (TRUE METRIX LEVEL 2) Normal SOLN Use as directed  . Blood Glucose Monitoring Suppl (TRUE METRIX AIR GLUCOSE METER) w/Device KIT 1 kit by Does not apply route 3 (three) times daily.  . carvedilol (COREG) 6.25 MG tablet Take 1 tablet (6.25 mg total) by mouth 2 (two) times daily with a meal.  . clotrimazole-betamethasone (LOTRISONE) cream Apply 1 application topically 2 (two) times daily.  . diclofenac  Sodium (VOLTAREN) 1 % GEL Apply 4 g topically 4 (four) times daily as needed.  . furosemide (LASIX) 40 MG tablet TAKE 1 TABLET (40 MG TOTAL) BY MOUTH DAILY AS NEEDED (LEG SWELLING).  [name] Kitchen glipiZIDE (GLUCOTROL XL) 5 MG 24 hr tablet Take 1 tablet (5 mg total) by mouth daily with breakfast.  . glucose blood (TRUE METRIX BLOOD GLUCOSE TEST) test strip Use as instructed  . insulin NPH-regular Human (70-30) 100 UNIT/ML injection Inject 60-70 Units into the skin See admin instructions. 70 units with breakfast and 60 units with supper for BG above 90  . levothyroxine (SYNTHROID) 100 MCG tablet Take 1 tablet (100 mcg total) by mouth daily.  . metoCLOPramide (REGLAN) 10 MG tablet Take 10 mg by mouth 4 (four) times daily.  . pantoprazole (PROTONIX) 40 MG tablet Take 1 tablet (40 mg total) by mouth 2 (two) times daily.  . polyethylene glycol (MIRALAX / GLYCOLAX) packet Take 17 g by mouth daily as needed for moderate constipation.  . pregabalin (LYRICA) 75 MG capsule TAKE 1 CAPSULE BY MOUTH TWICE A DAY  . TRUEplus Lancets 33G MISC Use as directed  . Alcohol Swabs (B-D SINGLE USE SWABS REGULAR) PADS Use as directed. (Patient not taking: Reported on 08/14/2019)  . ferrous gluconate (FERGON) 324 MG tablet Take 1 tablet (324 mg total) by mouth 2 (two) times daily with a meal. (Patient not taking: Reported on 08/14/2019)  . fluticasone (FLONASE) 50 MCG/ACT nasal spray Place 2 sprays into both nostrils daily. (Patient not taking: Reported on 08/14/2019)  . rosuvastatin (CRESTOR) 20 MG tablet Take  1 tablet (20 mg total) by mouth daily at 6 PM. (Patient not taking: Reported on 08/14/2019)   Facility-Administered Encounter Medications as of 08/14/2019  Medication  . 0.9 %  sodium chloride infusion   Current Diagnosis/Assessment: Goals Addressed            This Visit's Progress   . A1C <7.5%       CARE PLAN ENTRY Current Barriers:  . Diabetes: type 2; complicated by chronic medical conditions including high blood  pressure, chronic kidney disease. Lab Results  Component Value Date   HGBA1C 8.4 (H) 08/05/2019 .   Lab Results  Component Value Date   CREATININE 1.55 (H) 08/05/2019   CREATININE 2.07 (H) 04/20/2019   CREATININE 2.13 (H) 04/19/2019   Current diabetes regimen:  Glipizide XL 5 mg once daily    Insulin regular 70-3070 units with breakfast and 60 units with supper for BG above 90 . Denies hypoglycemic symptoms, including dizziness, lightheadedness, shaking, sweating  Pharmacist Clinical Goal(s):  [name] Kitchen Over the next 180 days, patient will work with PharmD and primary care provider to address diabetes. o A1c goal <7.5%2  Interventions: . Comprehensive medication review performed, medication list updated in electronic medical record . Inter-disciplinary care team collaboration  Patient Self Care Activities:  . Patient will check blood glucose daily , document, and provide at future appointments . Patient will take medications as prescribed . Patient will contact provider with any episodes of hypoglycemia . Patient will report any questions or concerns to provider  Initial goal documentation.    . BP <140/90       CARE PLAN ENTRY Current Barriers:  . Uncontrolled hypertension, complicated by type 2 diabetes . Current high blood pressure regimen:   Carvedilol 6.25 mg twice daily with meal (replaces losartan)  Furosemide 40 mg as needed  Amlodipine 10 mg daily  . Previous antihypertensives tried:   Losartan 50 mg once daily (held due to kidney injury, replaced by carvedilol)  . Last practice recorded BP readings:  BP Readings from Last 3 Encounters:  08/14/19 (!) 170/67  08/09/19 (!) 185/66  08/02/19 (!) 151/70  Pharmacist Clinical Goal(s):  [name] Kitchen Over the next 180 days, patient will work with PharmD and providers to optimize antihypertensive regimen o BP goal <140/90 Interventions: . Inter-disciplinary care team collaboration . Comprehensive medication review performed;  medication list updated in the electronic medical record.  Patient Self Care Activities:  . Patient will continue to check blood pressure. Monitor blood pressure various times throughout the day each day - document, and provide at future appointments Initial goal documentation.      Hypertension / Heart failure   Office blood pressures are  BP Readings from Last 3 Encounters:  08/14/19 (!) 170/67  08/09/19 (!) 185/66  08/02/19 (!) 151/70   Echocardiogram 04/17/2019: LVEF 55 to 60%, severe LVH, asymmetric hypertrophy with thickened basilar septum.  Indeterminate diastolic function.  Normal LA, mildly enlarged RV with normal function, normal RA.  Mild MR, trivial PR.  Normal PASP.  Mild dilation of the ascending aorta.  Losartan held due to AKI 04/2019, should have continued with amlodipine 10 mg daily  Patient is currentlyon the following medications:   Carvedilol 6.25 mg twice daily with meal (replaces losartan)  Losartan 50 mg once daily (held)  Furosemide 40 mg as needed  Amlodipine 10 mg daily   Patient checks BP at home infrequently. No readings provided today. Agreed to at least monitor once weekly but it is preferable to monitor  daily at different times throughout the day, keeping a log.   Is not SOB at rest but is after going to get medications. Mentions that she is still producing urine several x/day without a problem but furosemide does not impact amount/frequency of urination, even when taking two tabs of furosemide 40 mg. Patient agrees to discuss with nephrology.   We discussed diet recommendations at length.   Plan Continue current medications and control with diet and exercise.    Diabetes   Recent Relevant Labs: Lab Results  Component Value Date/Time   HGBA1C 8.4 (H) 08/05/2019 03:15 PM   HGBA1C 8.9 (A) 07/03/2019 03:54 PM   HGBA1C 10.3 (H) 03/01/2019 02:07 PM   MICROALBUR 570.4 08/05/2019 03:15 PM   MICROALBUR 188.7 06/01/2018 03:25 PM  Patient is currently  uncontrolled on the following medications:   Glipizide XL 5 mg once daily    Insulin regular 70-3070 units with breakfast and 60 units with supper for BG above 90  Reports monitoring glucose at least once daily in morning. Typically in 150s, lowest in 80s where she felt jittery. Denies this happening more than once every 2 months. We discussed recent a1c and microalb lab at length. Will be seeing nephrologist 5/14 or 5/15 per patient.   We discussed low blood sugar correction including the rule of 15. Keeps glucose tablets and hard candies in purse for use as needed. Has started limiting sweets where possible and has recently tried to use whole grain bread.   Plan Continue current medications and control with diet and exercise.   Hypothyroidism    Lab Results  Component Value Date   TSH 1.18 08/05/2019   TSH 2.13 03/01/2019   TSH 1.31 10/02/2018   Patient is currently controlled on the following medications: levothyroxine 100 mcg daily. TSH consistently within normal range since 09/2018.  Plan  Continue current medications.  Hyperlipidemia   Lipid Panel     Component Value Date/Time   CHOL 155 08/05/2019 1515   TRIG 269 (H) 08/05/2019 1515   HDL 41 (L) 08/05/2019 1515   CHOLHDL 3.8 08/05/2019 1515   VLDL 42 (H) 04/17/2019 0227   LDLCALC 79 08/05/2019 1515     The 10-year ASCVD risk score Mikey Bussing DC Jr., et al., 2013) is: 33.5%   Values used to calculate the score:     Age: 67 years     Sex: Female     Is Non-Hispanic African American: Yes     Diabetic: Yes     Tobacco smoker: No     Systolic Blood Pressure: 315 mmHg     Is BP treated: Yes     HDL Cholesterol: 41 mg/dL     Total Cholesterol: 155 mg/dL   Cholesterol controlled at last visit, triglycerides elevated. Unclear if rosuvastatin is still being used. After talking with patient, seems that she has ran out.   Plan Continue current medications. I will follow-up with pharmacies ([facility]) and provider to  ensure continuation of therapy if appropriate.   Vaccines   Reviewed patient's vaccination history.  Due for Shingrix.    Immunization History  Administered Date(s) Administered  . Influenza Split 01/31/2011  . Influenza Whole 02/23/2007  . Influenza, High Dose Seasonal PF 01/10/2017  . Influenza,inj,Quad PF,6+ Mos 01/07/2014, 02/09/2016, 01/01/2018, 12/03/2018  . Influenza-Unspecified 01/10/2012  . PFIZER SARS-COV-2 Vaccination 05/24/2019, 06/18/2019  . Pneumococcal Conjugate-13 05/16/2019  . Pneumococcal Polysaccharide-23 08/25/2014  . Tdap 04/20/2011   Plan  Recommended patient receive Shingrix vaccine in pharmacy.  Medication Management  Pt uses [facility] for all medications Uses pill box? No. Due to current insurance mail order provides lowest copay.   Will be seeing patient in office month. Has been advised to bring all medication bottles and diabetic testing supplies so we can review these together.   Plan  Continue current medication management strategy.  Follow up: 1 month visit in office.  ______________ Visit Information SDOH (Social Determinants of Health) assessments performed:  Yes.   Food Insecurity: No Food Insecurity  . Worried About Charity fundraiser in the Last Year: Never true  . Ran Out of Food in the Last Year: Never true     Transportation Needs: No Transportation Needs  . Lack of Transportation (Medical): No  . Lack of Transportation (Non-Medical): No   Ms. Locatelli was given information about Chronic Care Management services today including:  1. CCM service includes personalized support from designated clinical staff supervised by her physician, including individualized plan of care and coordination with other care providers 2. 24/7 contact phone numbers for assistance for urgent and routine care needs. 3. Standard insurance, coinsurance, copays and deductibles apply for chronic care management only during months in which  we provide at least 20 minutes of these services. Most insurances cover these services at 100%, however patients may be responsible for any copay, coinsurance and/or deductible if applicable. This service may help you avoid the need for more expensive face-to-face services. 4. Only one practitioner may furnish and bill the service in a calendar month. 5. The patient may stop CCM services at any time (effective at the end of the month) by phone call to the office staff.  Patient agreed to services and verbal consent obtained.   [name], Pharm.D. Clinical Pharmacist [facility] [phone]

## 2019-08-15 ENCOUNTER — Telehealth: Payer: Self-pay | Admitting: Family Medicine

## 2019-08-15 ENCOUNTER — Other Ambulatory Visit: Payer: Self-pay | Admitting: General Practice

## 2019-08-15 DIAGNOSIS — R5382 Chronic fatigue, unspecified: Secondary | ICD-10-CM | POA: Diagnosis not present

## 2019-08-15 DIAGNOSIS — I5032 Chronic diastolic (congestive) heart failure: Secondary | ICD-10-CM | POA: Diagnosis not present

## 2019-08-15 DIAGNOSIS — M2550 Pain in unspecified joint: Secondary | ICD-10-CM | POA: Diagnosis not present

## 2019-08-15 DIAGNOSIS — E559 Vitamin D deficiency, unspecified: Secondary | ICD-10-CM | POA: Diagnosis not present

## 2019-08-15 MED ORDER — CLOTRIMAZOLE-BETAMETHASONE 1-0.05 % EX CREA: 1.0000 "application " | TOPICAL_CREAM | Freq: Two times a day (BID) | CUTANEOUS | 1 refills | Status: DC

## 2019-08-15 NOTE — Telephone Encounter (Signed)
Patient called stating that her pharmacy does not have the Lotrisone cream.  Pharmacy listed is correct.  Would like script to be resent.

## 2019-08-15 NOTE — Telephone Encounter (Signed)
[facility] nurse call stating that she has advised patient to call 911 to go to the ED.    Patient has refused.   Patient states she has been having right arm pain since November (after a sleep study).  Patient also informed nurse that she has had heart pain for awhile but has been following up with a cardiologist in regard.    Nurse has disconnected with patient.

## 2019-08-15 NOTE — Telephone Encounter (Signed)
Medication filled to pharmacy as requested.   

## 2019-08-15 NOTE — Telephone Encounter (Signed)
FYI

## 2019-08-19 ENCOUNTER — Other Ambulatory Visit: Payer: Self-pay | Admitting: Family Medicine

## 2019-08-20 ENCOUNTER — Other Ambulatory Visit: Payer: Self-pay | Admitting: Family Medicine

## 2019-08-21 ENCOUNTER — Other Ambulatory Visit: Payer: Self-pay | Admitting: Family Medicine

## 2019-08-21 MED ORDER — ROSUVASTATIN CALCIUM 20 MG PO TABS: 20.0000 mg | ORAL_TABLET | Freq: Every day | ORAL | 1 refills | Status: DC

## 2019-08-21 MED ORDER — METOCLOPRAMIDE HCL 10 MG PO TABS: 10.0000 mg | ORAL_TABLET | Freq: Four times a day (QID) | ORAL | 1 refills | Status: DC

## 2019-08-21 NOTE — Telephone Encounter (Signed)
Please advise? You have not filled either.

## 2019-08-21 NOTE — Telephone Encounter (Signed)
MEDICATION: metoCLOPramide (REGLAN) 10 MG tablet rosuvastatin (CRESTOR) 20 MG tablet   PHARMACY: [facility], [place] Phone:  [phone]  Fax:  [phone]      Comments:   **Let patient know to contact pharmacy at the end of the day to make sure medication is ready. **  ** Please notify patient to allow 48-72 hours to process**  **Encourage patient to contact the pharmacy for refills or they can request refills through [facility]**

## 2019-08-22 ENCOUNTER — Other Ambulatory Visit: Payer: Self-pay

## 2019-08-22 ENCOUNTER — Encounter: Payer: Self-pay | Admitting: Physical Medicine and Rehabilitation

## 2019-08-22 ENCOUNTER — Ambulatory Visit: Payer: Self-pay

## 2019-08-22 ENCOUNTER — Ambulatory Visit (INDEPENDENT_AMBULATORY_CARE_PROVIDER_SITE_OTHER): Payer: Medicare HMO | Admitting: Physical Medicine and Rehabilitation

## 2019-08-22 VITALS — BP 165/80 | HR 53

## 2019-08-22 DIAGNOSIS — M5416 Radiculopathy, lumbar region: Secondary | ICD-10-CM | POA: Diagnosis not present

## 2019-08-22 MED ORDER — METHYLPREDNISOLONE ACETATE 80 MG/ML IJ SUSP
40.0000 mg | Freq: Once | INTRAMUSCULAR | Status: AC
  Administered 2019-08-22: 40 mg

## 2019-08-22 NOTE — Progress Notes (Signed)
Pt states pain mostly on the right side that radiates into the right leg all the way down. Pt states last injection 07/11/17 helped for two days and returned shortly after injection. Pt states standing makes pain worse. Sitting makes pain better.   .Numeric Pain Rating Scale and Functional Assessment Average Pain 10   In the last MONTH (on 0-10 scale) has pain interfered with the following?  1. General activity like being  able to carry out your everyday physical activities such as walking, climbing stairs, carrying groceries, or moving a chair?  Rating(10)   +Driver, -BT, -Dye Allergies.

## 2019-08-23 DIAGNOSIS — N200 Calculus of kidney: Secondary | ICD-10-CM | POA: Diagnosis not present

## 2019-08-23 DIAGNOSIS — I5032 Chronic diastolic (congestive) heart failure: Secondary | ICD-10-CM | POA: Diagnosis not present

## 2019-08-23 DIAGNOSIS — R809 Proteinuria, unspecified: Secondary | ICD-10-CM | POA: Diagnosis not present

## 2019-08-23 DIAGNOSIS — I272 Pulmonary hypertension, unspecified: Secondary | ICD-10-CM | POA: Diagnosis not present

## 2019-08-23 DIAGNOSIS — I129 Hypertensive chronic kidney disease with stage 1 through stage 4 chronic kidney disease, or unspecified chronic kidney disease: Secondary | ICD-10-CM | POA: Diagnosis not present

## 2019-08-23 DIAGNOSIS — E1122 Type 2 diabetes mellitus with diabetic chronic kidney disease: Secondary | ICD-10-CM | POA: Diagnosis not present

## 2019-08-23 DIAGNOSIS — N1832 Chronic kidney disease, stage 3b: Secondary | ICD-10-CM | POA: Diagnosis not present

## 2019-08-23 NOTE — Progress Notes (Signed)
[name] - 69 y.o. female MRN [id number]  Date of birth: [birth year]  Office Visit Note: Visit Date: 08/22/2019 PCP: [name], MD Referred by: [name], MD  Subjective: Chief Complaint  Patient presents with  . Lower Back - Pain  . Right Leg - Pain   HPI:  [name] is a 69 y.o. female who comes in today At the request of Dr. Basil Dess for planned Right L1-L2 lumbar epidural steroid injection with fluoroscopic guidance.  The patient has failed conservative care including home exercise, medications, time and activity modification.  This injection will be diagnostic and hopefully therapeutic.  Please see requesting physician notes for further details and justification.  L1-2 level is at the level above the fusion and counted down from the last rib.  Patient does have somewhat of a transitional lower segment.   ROS Otherwise per HPI.  Assessment & Plan: Visit Diagnoses:  1. Lumbar radiculopathy     Plan: No additional findings.   Meds & Orders:  Meds ordered this encounter  Medications  . methylPREDNISolone acetate (DEPO-MEDROL) injection 40 mg    Orders Placed This Encounter  Procedures  . XR C-ARM NO REPORT  . Epidural Steroid injection    Follow-up: Return for Basil Dess, M.D..   Procedures: No procedures performed  Lumbar Epidural Steroid Injection - Interlaminar Approach with Fluoroscopic Guidance  [name]      Date of Birth: Mar 24, 1951 MRN: [id number] PCP: [name], MD      Visit Date: 08/22/2019   Universal Protocol:     Consent Given By: the patient  Position: PRONE  Additional Comments: Vital signs were monitored before and after the procedure. Patient was prepped and draped in the usual sterile fashion. The correct patient, procedure, and site was verified.   Injection Procedure Details:  Procedure Site One Meds Administered:  Meds ordered this encounter    Medications  . methylPREDNISolone acetate (DEPO-MEDROL) injection 40 mg     Laterality: Right  Location/Site: L1-2 level is at the level above the fusion and counted down from the last rib.  Patient does have somewhat of a transitional lower segment. L1-L2  Needle size: 20 G  Needle type: Tuohy  Needle Placement: Paramedian epidural  Findings:   -Comments: Excellent flow of contrast into the epidural space.  Procedure Details: Using a paramedian approach from the side mentioned above, the region overlying the inferior lamina was localized under fluoroscopic visualization and the soft tissues overlying this structure were infiltrated with 4 ml. of 1% Lidocaine without Epinephrine. The Tuohy needle was inserted into the epidural space using a paramedian approach.   The epidural space was localized using loss of resistance along with lateral and bi-planar fluoroscopic views.  After negative aspirate for air, blood, and CSF, a 2 ml. volume of Isovue-250 was injected into the epidural space and the flow of contrast was observed. Radiographs were obtained for documentation purposes.    The injectate was administered into the level noted above.   Additional Comments:  The patient tolerated the procedure well Dressing: 2 x 2 sterile gauze and Band-Aid    Post-procedure details: Patient was observed during the procedure. Post-procedure instructions were reviewed.  Patient left the clinic in stable condition.    Clinical History: CLINICAL DATA:  Previous lumbar fusion surgeries. Low back pain radiating to the right buttock and right leg and to the left hip. Symptoms worsening.  EXAM: MRI LUMBAR SPINE WITHOUT CONTRAST  TECHNIQUE: Multiplanar, multisequence MR imaging of the lumbar spine was performed. No intravenous contrast was administered.  COMPARISON:  CT 12/05/2018.  MRI 05/28/2017.  FINDINGS: Segmentation: Same numbering terminology used previously.  Previous surgery from L2-S1.  Alignment:  3 mm retrolisthesis L1-2 and L2-3.  Vertebrae: Old partial compression fracture at L2. Posterior fusion hardware from L2-S1.  Conus medullaris and cauda equina: Conus extends to the L1-2 level. Conus and cauda equina appear normal.  Paraspinal and other soft tissues: Negative except for frequently seen neurogenic changes of the posterior paraspinous musculature subsequent to the previous surgery.  Disc levels:  T10-11 and T11-12 are normal.  T12-L1: Shallow disc herniation with caudal migration as seen previously. This indents the ventral thecal sac but does not appear to cause neural compression.  L1-2: 3 mm retrolisthesis. Endplate osteophytes and broad-based calcified disc herniation with slight caudal migration. As seen previously, pedicle screws at L2 breach the superior endplate and could contribute to disc degeneration. Mild stenosis of the lateral recesses and foramina but without gross neural compression.  L2-3: 3 mm retrolisthesis. Endplate osteophytes and bulging of the disc. Good posterior decompression with sufficient patency of the canal and foramina.  L3 to S1: Previous posterior decompression and fusion with solid union. Sufficient patency of the canal and foramina through that region.  IMPRESSION: Previous discectomy and fusion surgery from L2 to the sacrum. Solid union from L3 through S1 with sufficient patency of the canal and foramina.  At L2-3, there may be ongoing nonunion, but there does not appear to be any compressive stenosis of the canal or foramina. No unexpected bone edema. Mild spacer subsidence. Old minor compression deformity of L2.  At L1-2, there is a chronic calcified disc herniation with slight caudal migration. Pedicle screws at L2 do breach the superior endplate of L2, entering the disc space. Mild canal and foraminal narrowing but no gross neural compression or progressive  changes since the previous exams.  At T12-L1, there is a chronic calcified central disc herniation with caudal migration that does not appear to cause neural compression.  I do not see a distinct explanation for changing or worsening pain.   Electronically Signed   By: Nelson Chimes M.D.   On: 01/26/2019 09:10     Objective:  VS:  HT:    WT:   BMI:     BP:(!) 165/80  HR:(!) 53bpm  TEMP: ( )  RESP:  Physical Exam   Imaging: XR C-ARM NO REPORT  Result Date: 08/22/2019 Please see Notes tab for imaging impression.

## 2019-08-23 NOTE — Procedures (Signed)
Lumbar Epidural Steroid Injection - Interlaminar Approach with Fluoroscopic Guidance  [name]      Date of Birth: 29-May-1950 MRN: [id number] PCP: [name], MD      Visit Date: 08/22/2019   Universal Protocol:     Consent Given By: the patient  Position: PRONE  Additional Comments: Vital signs were monitored before and after the procedure. Patient was prepped and draped in the usual sterile fashion. The correct patient, procedure, and site was verified.   Injection Procedure Details:  Procedure Site One Meds Administered:  Meds ordered this encounter  Medications  . methylPREDNISolone acetate (DEPO-MEDROL) injection 40 mg     Laterality: Right  Location/Site: L1-2 level is at the level above the fusion and counted down from the last rib.  Patient does have somewhat of a transitional lower segment. L1-L2  Needle size: 20 G  Needle type: Tuohy  Needle Placement: Paramedian epidural  Findings:   -Comments: Excellent flow of contrast into the epidural space.  Procedure Details: Using a paramedian approach from the side mentioned above, the region overlying the inferior lamina was localized under fluoroscopic visualization and the soft tissues overlying this structure were infiltrated with 4 ml. of 1% Lidocaine without Epinephrine. The Tuohy needle was inserted into the epidural space using a paramedian approach.   The epidural space was localized using loss of resistance along with lateral and bi-planar fluoroscopic views.  After negative aspirate for air, blood, and CSF, a 2 ml. volume of Isovue-250 was injected into the epidural space and the flow of contrast was observed. Radiographs were obtained for documentation purposes.    The injectate was administered into the level noted above.   Additional Comments:  The patient tolerated the procedure well Dressing: 2 x 2 sterile gauze and Band-Aid    Post-procedure details: Patient was  observed during the procedure. Post-procedure instructions were reviewed.  Patient left the clinic in stable condition.

## 2019-08-27 ENCOUNTER — Other Ambulatory Visit: Payer: Self-pay | Admitting: Nephrology

## 2019-08-27 DIAGNOSIS — N1832 Chronic kidney disease, stage 3b: Secondary | ICD-10-CM

## 2019-09-02 ENCOUNTER — Encounter: Payer: Self-pay | Admitting: "Endocrinology

## 2019-09-02 ENCOUNTER — Ambulatory Visit (INDEPENDENT_AMBULATORY_CARE_PROVIDER_SITE_OTHER): Payer: Medicare HMO | Admitting: "Endocrinology

## 2019-09-02 ENCOUNTER — Other Ambulatory Visit: Payer: Self-pay

## 2019-09-02 VITALS — BP 135/80 | HR 64 | Ht 64.0 in | Wt 207.8 lb

## 2019-09-02 DIAGNOSIS — I10 Essential (primary) hypertension: Secondary | ICD-10-CM

## 2019-09-02 DIAGNOSIS — E1159 Type 2 diabetes mellitus with other circulatory complications: Secondary | ICD-10-CM | POA: Diagnosis not present

## 2019-09-02 DIAGNOSIS — E890 Postprocedural hypothyroidism: Secondary | ICD-10-CM | POA: Diagnosis not present

## 2019-09-02 DIAGNOSIS — E1129 Type 2 diabetes mellitus with other diabetic kidney complication: Secondary | ICD-10-CM | POA: Diagnosis not present

## 2019-09-02 MED ORDER — FREESTYLE LIBRE 14 DAY READER DEVI: 1.0000 | Freq: Once | 0 refills | Status: AC

## 2019-09-02 MED ORDER — FREESTYLE LIBRE 14 DAY SENSOR MISC: 1.0000 | 2 refills | Status: DC

## 2019-09-02 NOTE — Patient Instructions (Signed)

## 2019-09-02 NOTE — Progress Notes (Signed)
09/02/2019, 1:33 PM   Endocrinology follow-up note   Subjective:    Patient ID: [name], female    DOB: 1951-01-06.  [name] is being seen in follow-up for management of currently uncontrolled,complicated symptomatic type 2 diabetes and associated hypertension, hypothyroidism. PMD :  [name], MD.   Past Medical History:  Diagnosis Date  . Anginal pain ([place]) 03/19/2012   saw Dr. Cathie Olden .. she thinks its more related to stomach issues  . Arthritis    "all over; mostly in my back" (03/20/2012)  . Breast cancer ([place])    "right" (03/20/2012)  . Cardiomyopathy   . Chest pain 06/2005   Hospitalized, dystolic dysfunction  . Chronic lower back pain    "have 2 herniated discs; going to have to have a fusion" (03/20/2012)  . CKD (chronic kidney disease), stage III   . Family history of anesthesia complication    "father has nausea and vomiting"  . Gallstones   . GERD (gastroesophageal reflux disease)   . H/O hiatal hernia   . History of kidney stones   . History of right mastectomy   . Hypertension    "patient states never had HTN, takes Hyzaar for heart  . Hypothyroidism   . Irritable bowel syndrome   . Migraines    "it's been a long time since I've had one" (03/20/2012)  . Osteoarthritis   . Peripheral neuropathy   . PONV (postoperative nausea and vomiting)   . Sciatica   . Type II diabetes mellitus ([place])    Past Surgical History:  Procedure Laterality Date  . ABDOMINAL HYSTERECTOMY  1993  . adenosin cardiolite  07/2005   (+) wall motion abnormality  . AXILLARY LYMPH NODE DISSECTION  2003   squamous cell cancer  . BACK SURGERY    . CARDIAC CATHETERIZATION  ? 2005 / 2007  . CHOLECYSTECTOMY N/A 01/24/2018   Procedure: LAPAROSCOPIC CHOLECYSTECTOMY;  Surgeon: Erroll Luna, MD;  Location: [facility];  Service: General;  Laterality: N/A;  . CT abdomen and  pelvis  02/2010   Same  . ESOPHAGOGASTRODUODENOSCOPY  2005   GERD  . KNEE ARTHROSCOPY  1990's   "right" (03/20/2012)  . LUMBAR FUSION N/A 08/22/2014   Procedure: Right L2-3 and right L1-2 transforaminal lumbar interbody fusion with pedicle screws, rods, sleeves, cages, local bone graft, Vivigen, cancellous chips;  Surgeon: Jessy Oto, MD;  Location: [facility];  Service: Orthopedics;  Laterality: N/A;  . LUMBAR LAMINECTOMY/DECOMPRESSION MICRODISCECTOMY N/A 02/04/2014   Procedure: RIGHT L2-3 MICRODISCECTOMY ;  Surgeon: Jessy Oto, MD;  Location: [facility];  Service: Orthopedics;  Laterality: N/A;  . MASTECTOMY Right   . MASTECTOMY WITH AXILLARY LYMPH NODE DISSECTION  12/2003   "right" (03/20/2012)  . POSTERIOR CERVICAL FUSION/FORAMINOTOMY  2001  . SHOULDER ARTHROSCOPY W/ ROTATOR CUFF REPAIR  2003   Left  . THYROIDECTOMY  1977   Right  . TOTAL KNEE ARTHROPLASTY  2000   Right  . [place] of abdomen  07/2005   Fatty liver / kidney stones   Social History   Socioeconomic History  . Marital status: Married    Spouse name: Not on  file  . Number of children: 1  . Years of education: Not on file  . Highest education level: Not on file  Occupational History  . Occupation: Retired since 2003  Tobacco Use  . Smoking status: Never Smoker  . Smokeless tobacco: Never Used  Substance and Sexual Activity  . Alcohol use: No  . Drug use: No  . Sexual activity: Not on file  Other Topics Concern  . Not on file  Social History Narrative  . Not on file   Social Determinants of Health   Financial Resource Strain:   . Difficulty of Paying Living Expenses:   Food Insecurity: No Food Insecurity  . Worried About Charity fundraiser in the Last Year: Never true  . Ran Out of Food in the Last Year: Never true  Transportation Needs: No Transportation Needs  . Lack of Transportation (Medical): No  . Lack of Transportation (Non-Medical): No  Physical Activity:   . Days of Exercise per Week:   . Minutes  of Exercise per Session:   Stress:   . Feeling of Stress :   Social Connections:   . Frequency of Communication with Friends and Family:   . Frequency of Social Gatherings with Friends and Family:   . Attends Religious Services:   . Active Member of Clubs or Organizations:   . Attends Archivist Meetings:   [name] Kitchen Marital Status:    Outpatient Encounter Medications as of 09/02/2019  Medication Sig  . acetaminophen (TYLENOL) 500 MG tablet Take 1,000 mg by mouth every 6 (six) hours as needed for moderate pain or headache.  . albuterol (VENTOLIN HFA) 108 (90 Base) MCG/ACT inhaler Inhale 2 puffs into the lungs every 6 (six) hours as needed for wheezing or shortness of breath.  . Alcohol Swabs (B-D SINGLE USE SWABS REGULAR) PADS Use as directed. (Patient not taking: Reported on 08/14/2019)  . amLODipine (NORVASC) 10 MG tablet TAKE 1 TABLET BY MOUTH EVERY DAY  . aspirin 81 MG tablet Take 81 mg by mouth daily.  . Blood Glucose Calibration (TRUE METRIX LEVEL 2) Normal SOLN Use as directed  . Blood Glucose Monitoring Suppl (TRUE METRIX AIR GLUCOSE METER) w/Device KIT 1 kit by Does not apply route 3 (three) times daily.  . carvedilol (COREG) 6.25 MG tablet Take 1 tablet (6.25 mg total) by mouth 2 (two) times daily with a meal.  . clotrimazole-betamethasone (LOTRISONE) cream Apply 1 application topically 2 (two) times daily.  . Continuous Blood Gluc Receiver (FREESTYLE LIBRE 14 DAY READER) DEVI 1 each by Does not apply route once for 1 dose.  . Continuous Blood Gluc Sensor (FREESTYLE LIBRE 14 DAY SENSOR) MISC Inject 1 each into the skin every 14 (fourteen) days. Use as directed.  . diclofenac Sodium (VOLTAREN) 1 % GEL Apply 4 g topically 4 (four) times daily as needed.  . ferrous gluconate (FERGON) 324 MG tablet Take 1 tablet (324 mg total) by mouth 2 (two) times daily with a meal.  . fluticasone (FLONASE) 50 MCG/ACT nasal spray Place 2 sprays into both nostrils daily.  . furosemide (LASIX) 40 MG  tablet TAKE 1 TABLET (40 MG TOTAL) BY MOUTH DAILY AS NEEDED (LEG SWELLING).  [name] Kitchen glipiZIDE (GLUCOTROL XL) 5 MG 24 hr tablet Take 1 tablet (5 mg total) by mouth daily with breakfast.  . glucose blood (TRUE METRIX BLOOD GLUCOSE TEST) test strip Use as instructed  . insulin NPH-regular Human (70-30) 100 UNIT/ML injection Inject 60 Units into the skin See admin  instructions. 60 units with breakfast and 60 units with supper for BG above 90  . levothyroxine (SYNTHROID) 100 MCG tablet Take 1 tablet (100 mcg total) by mouth daily.  [name] Kitchen losartan (COZAAR) 50 MG tablet Take 50 mg by mouth daily.  . metoCLOPramide (REGLAN) 10 MG tablet Take 1 tablet (10 mg total) by mouth 4 (four) times daily.  . pantoprazole (PROTONIX) 40 MG tablet Take 1 tablet (40 mg total) by mouth 2 (two) times daily.  . polyethylene glycol (MIRALAX / GLYCOLAX) packet Take 17 g by mouth daily as needed for moderate constipation.  . pregabalin (LYRICA) 75 MG capsule TAKE 1 CAPSULE BY MOUTH TWICE A DAY  . rosuvastatin (CRESTOR) 20 MG tablet Take 1 tablet (20 mg total) by mouth daily at 6 PM.  . TRUEplus Lancets 33G MISC Use as directed   Facility-Administered Encounter Medications as of 09/02/2019  Medication  . 0.9 %  sodium chloride infusion    ALLERGIES: Allergies  Allergen Reactions  . Dilaudid [Hydromorphone Hcl] Nausea And Vomiting  . Lipitor [Atorvastatin] Other (See Comments)    Body & Muscle Aches  . Adhesive [Tape] Rash  . Ampicillin Rash and Other (See Comments)    REACTION: Rash and welts on her hands when she took it in the hospital  . Latex Itching, Dermatitis, Rash and Other (See Comments)    REACTION: Patient had a reaction to tape after surgery and they told her she was allergic to Latex   . Penicillins Rash and Other (See Comments)    Has patient had a PCN reaction causing immediate rash, facial/tongue/throat swelling, SOB or lightheadedness with hypotension: No Has patient had a PCN reaction causing severe rash  involving mucus membranes or skin necrosis: No Has patient had a PCN reaction that required hospitalization: Yes - in hospital receiving treatment Has patient had a PCN reaction occurring within the last 10 years: No If all of the above answers are "NO", then may proceed with Cephalosporin use.     VACCINATION STATUS: Immunization History  Administered Date(s) Administered  . Influenza Split 01/31/2011  . Influenza Whole 02/23/2007  . Influenza, High Dose Seasonal PF 01/10/2017  . Influenza,inj,Quad PF,6+ Mos 01/07/2014, 02/09/2016, 01/01/2018, 12/03/2018  . Influenza-Unspecified 01/10/2012  . PFIZER SARS-COV-2 Vaccination 05/24/2019, 06/18/2019  . Pneumococcal Conjugate-13 05/16/2019  . Pneumococcal Polysaccharide-23 08/25/2014  . Tdap 04/20/2011    Diabetes She presents for her follow-up diabetic visit. She has type 2 diabetes mellitus. Onset time: She was diagnosed at approximate age of 28 years. Her disease course has been worsening. There are no hypoglycemic associated symptoms. Pertinent negatives for hypoglycemia include no confusion, headaches, pallor or seizures. Associated symptoms include foot ulcerations, polydipsia and polyuria. Pertinent negatives for diabetes include no blurred vision, no chest pain, no fatigue and no polyphagia. There are no hypoglycemic complications. Symptoms are improving. Diabetic complications include nephropathy and retinopathy. Risk factors for coronary artery disease include diabetes mellitus, hypertension, obesity, sedentary lifestyle and post-menopausal. Current diabetic treatment includes insulin injections (She is currently on Novolin 70/30 65 units a.m., 35 units p.m. along with metformin 850 mg p.o. twice daily.). Her weight is decreasing steadily. She is following a generally unhealthy diet. When asked about meal planning, she reported none. She has not had a previous visit with a dietitian. She never participates in exercise. Her home blood  glucose trend is decreasing steadily. Her breakfast blood glucose range is generally 140-180 mg/dl. Her dinner blood glucose range is generally 180-200 mg/dl. Her overall blood glucose  range is 180-200 mg/dl. (She presents with an incomplete log of glycemic profile and insulin administration.  Her point-of-care A1c is 8.4%, overall improving from 10.3%.  She did not have any hypoglycemia. ) An ACE inhibitor/angiotensin II receptor blocker is being taken. Eye exam is current.  Hypertension This is a chronic problem. The current episode started more than 1 year ago. The problem is uncontrolled. Pertinent negatives include no blurred vision, chest pain, headaches, palpitations or shortness of breath. Risk factors for coronary artery disease include diabetes mellitus, obesity, sedentary lifestyle, dyslipidemia and family history. Past treatments include angiotensin blockers. Hypertensive end-organ damage includes retinopathy. Identifiable causes of hypertension include a thyroid problem.  Thyroid Problem Presents for follow-up visit. Onset time: She underwent what appears to be total thyroidectomy in her 67s for reportedly benign goiter.  Has taken thyroid hormone ever since, currently levothyroxine 100 mcg p.o. nightly. Patient reports no cold intolerance, diarrhea, fatigue, heat intolerance or palpitations. The symptoms have been stable. Past treatments include levothyroxine. Prior procedures include thyroidectomy.     Review of systems  Constitutional: + Minimally fluctuating body weight,  current  Body mass index is 35.67 kg/m. , no fatigue, no subjective hyperthermia, no subjective hypothermia Eyes: no blurry vision, no xerophthalmia ENT: no sore throat, no nodules palpated in throat, no dysphagia/odynophagia, no hoarseness Cardiovascular: no Chest Pain, no Shortness of Breath, no palpitations, no leg swelling Respiratory: no cough, no shortness of breath Gastrointestinal: no  Nausea/Vomiting/Diarhhea Musculoskeletal: + She ambulates with a cane, no muscle/joint aches Skin: no rashes, no hyperemia Neurological: no tremors, no numbness, no tingling, no dizziness Psychiatric: no depression, no anxiety    Objective:    BP 135/80   Pulse 64   Ht '5\' 4"'  (1.626 m)   Wt 207 lb 12.8 oz (94.3 kg)   BMI 35.67 kg/m   Wt Readings from Last 3 Encounters:  09/02/19 207 lb 12.8 oz (94.3 kg)  08/09/19 210 lb (95.3 kg)  08/02/19 211 lb (95.7 kg)     Physical Exam- Limited  Constitutional:  Body mass index is 35.67 kg/m. , not in acute distress, normal state of mind Eyes:  EOMI, no exophthalmos Neck: Supple Thyroid: No gross goiter Respiratory: Adequate breathing efforts Musculoskeletal: no gross deformities, + ambulates with a cane,  no gross restriction of joint movements Skin:  no rashes, no hyperemia Neurological: no tremor with outstretched hands,    CMP Latest Ref Rng & Units 08/05/2019 08/05/2019 04/20/2019  Glucose 65 - 139 mg/dL - 129 223(H)  BUN 7 - 25 mg/dL - 25 30(H)  Creatinine 0.60 - 0.93 mg/dL - 1.55(H) 2.07(H)  Sodium 135 - 146 mmol/L - 142 138  Potassium 3.5 - 5.3 mmol/L - 4.2 4.4  Chloride 98 - 110 mmol/L - 111(H) 105  CO2 20 - 32 mmol/L - 24 24  Calcium 8.6 - 10.4 mg/dL - 10.5(H) 9.4  Total Protein 6.1 - 8.1 g/dL 6.6 6.6 -  Total Bilirubin 0.2 - 1.2 mg/dL 0.3 0.3 -  Alkaline Phos 38 - 126 U/L - - -  AST 10 - 35 U/L 16 16 -  ALT 6 - 29 U/L 11 11 -     Diabetic Labs (most recent): Lab Results  Component Value Date   HGBA1C 8.4 (H) 08/05/2019   HGBA1C 8.9 (A) 07/03/2019   HGBA1C 10.3 (H) 03/01/2019     Lipid Panel ( most recent) Lipid Panel     Component Value Date/Time   CHOL 155 08/05/2019 1515  TRIG 269 (H) 08/05/2019 1515   HDL 41 (L) 08/05/2019 1515   CHOLHDL 3.8 08/05/2019 1515   VLDL 42 (H) 04/17/2019 0227   LDLCALC 79 08/05/2019 1515      Assessment & Plan:   1. DM type 2 causing vascular disease ([place])  -  [name] has currently uncontrolled symptomatic type 2 DM since 69 years of age.   She presents with an incomplete log of glycemic profile and insulin administration.  Her point-of-care A1c is 8.4%, overall improving from 10.3%.  She did not have any hypoglycemia.  Recent labs, biopsy results reviewed, showing stage 3-4 renal insufficiency with insignificant proteinuria.  -her diabetes is complicated by retinopathy, obesity/sedentary life, and she remains at a high risk for more acute and chronic complications which include CAD, CVA, CKD, retinopathy, and neuropathy. These are all discussed in detail with her.  - I have counseled her on diet management and weight loss, by adopting a carbohydrate restricted/protein rich diet.  - she  admits there is a room for improvement in her diet and drink choices. -  Suggestion is made for her to avoid simple carbohydrates  from her diet including Cakes, Sweet Desserts / Pastries, Ice Cream, Soda (diet and regular), Sweet Tea, Candies, Chips, Cookies, Sweet Pastries,  Store Bought Juices, Alcohol in Excess of  1-2 drinks a day, Artificial Sweeteners, Coffee Creamer, and "Sugar-free" Products. This will help patient to have stable blood glucose profile and potentially avoid unintended weight gain.  - I encouraged her to switch to  unprocessed or minimally processed complex starch and increased protein intake (animal or plant source), fruits, and vegetables.  - she is advised to stick to a routine mealtimes to eat 3 meals  a day and avoid unnecessary snacks ( to snack only to correct hypoglycemia).   - she is following with Jearld Fenton, RDN, CDE for individualized diabetes education.  - I have approached her with the following individualized plan to manage diabetes and patient agrees:   -She will continue to benefit from her current simplified insulin treatment regimen.   In order for her to clear for back surgery, she will need a higher  dose of insulin in order for her to achieve control of diabetes to target.  -She is advised to engage better and start monitoring blood glucose 4 times a day-daily before meals and at bedtime.  -She will be considered for a CGM device, freestyle libre.  -She is advised to readjust her Novolin 70/30 at 60 units with breakfast and, and 60 units daily with supper  for pre-meal blood glucose readings above 90 mg/dL,  associated with strict monitoring of glucose 4 times a day-before meals and at bedtime and patient will return in 5 weeks for reevaluation.    - she is warned not to take insulin without proper monitoring per orders. - she is encouraged to call clinic for blood glucose levels less than 70 or above 300 mg /dl. -She was recently taken off of Metformin due to CKD.  She is benefiting from low-dose glipizide, advised to continue  glipizide 5 mg XL daily at breakfast.    - Patient specific target  A1c;  LDL, HDL, Triglycerides, were discussed in detail.  2) BP/HTN:  -Her blood pressure is controlled to target.     she is advised to continue her current medications including losartan 50 mg p.o. daily at breakfast.    3) Lipids/HPL:   Review of her recent lipid panel showed uncontrolled  LDL at 96.  she has documented adverse reactions to Lipitor.  She will be considered for low-dose Niaspan on next visit.    4)  Weight/Diet: Her BMI 36- clearly complicating her diabetes care.  Is a candidate for modest weight loss.  I discussed with her the fact that loss of 5 - 10% of her  current body weight will have the most impact on her diabetes management.  CDE Consult will be initiated . Exercise, and detailed carbohydrates information provided  -  detailed on discharge instructions.  5) hypothyroidism-postsurgical  Her recent thyroid function tests are consistent with appropriate replacement.  She is advised to continue levothyroxine 100 mcg p.o. daily before breakfast.     - We discussed about the  correct intake of her thyroid hormone, on empty stomach at fasting, with water, separated by at least 30 minutes from breakfast and other medications,  and separated by more than 4 hours from calcium, iron, multivitamins, acid reflux medications (PPIs). -Patient is made aware of the fact that thyroid hormone replacement is needed for life, dose to be adjusted by periodic monitoring of thyroid function tests.   6) nodular goiter. -Her most recent thyroid/neck ultrasound revealed 2.9 cm nodule on the left lobe of her thyroid -She has surgically absent right thyroid lobe.  Biopsy of 2.9 cm nodule on the left lobe is benign.    She will not need any surgical intervention at this time.     7) vitamin D deficiency -She is on ongoing supplement with vitamin D3 5000 units daily.     8) Chronic Care/Health Maintenance:  -she  is on ARB and Statin medications and  is encouraged to initiate and continue to follow up with Ophthalmology, Dentist,  Podiatrist at least yearly or according to recommendations, and advised to  stay away from smoking. I have recommended yearly flu vaccine and pneumonia vaccine at least every 5 years; moderate intensity exercise for up to 150 minutes weekly; and  sleep for at least 7 hours a day.  - I advised patient to maintain close follow up with [name], MD for primary care needs.  - Time spent on this patient care encounter:  35 min, of which > 50% was spent in  counseling and the rest reviewing her blood glucose logs , discussing her hypoglycemia and hyperglycemia episodes, reviewing her current and  previous labs / studies  ( including abstraction from other facilities) and medications  doses and developing a  long term treatment plan and documenting her care.   Please refer to Patient Instructions for Blood Glucose Monitoring and Insulin/Medications Dosing Guide"  in media tab for additional information. Please  also refer to " Patient Self Inventory" in the  Media  tab for reviewed elements of pertinent patient history.  [name] participated in the discussions, expressed understanding, and voiced agreement with the above plans.  All questions were answered to her satisfaction. she is encouraged to contact clinic should she have any questions or concerns prior to her return visit.  Follow up plan: - Return in about 3 months (around 12/03/2019) for Bring Meter and Logs- A1c in Office.  Glade Lloyd, MD [facility] [address] [place], [place] [address] Phone: [phone]  Fax: [phone]    09/02/2019, 1:33 PM  This note was partially dictated with voice recognition software. Similar sounding words can be transcribed inadequately or may not  be corrected upon review.

## 2019-09-05 ENCOUNTER — Other Ambulatory Visit: Payer: Self-pay

## 2019-09-05 ENCOUNTER — Encounter: Payer: Self-pay | Admitting: Specialist

## 2019-09-05 ENCOUNTER — Ambulatory Visit: Payer: Medicare HMO | Admitting: Specialist

## 2019-09-05 ENCOUNTER — Ambulatory Visit: Payer: Self-pay

## 2019-09-05 VITALS — BP 145/71 | HR 56 | Ht 64.0 in | Wt 210.0 lb

## 2019-09-05 DIAGNOSIS — M5136 Other intervertebral disc degeneration, lumbar region: Secondary | ICD-10-CM

## 2019-09-05 DIAGNOSIS — M533 Sacrococcygeal disorders, not elsewhere classified: Secondary | ICD-10-CM

## 2019-09-05 DIAGNOSIS — Z981 Arthrodesis status: Secondary | ICD-10-CM | POA: Diagnosis not present

## 2019-09-05 DIAGNOSIS — M5135 Other intervertebral disc degeneration, thoracolumbar region: Secondary | ICD-10-CM | POA: Diagnosis not present

## 2019-09-05 MED ORDER — TRAMADOL HCL 50 MG PO TABS: 100.0000 mg | ORAL_TABLET | Freq: Four times a day (QID) | ORAL | 0 refills | Status: DC | PRN

## 2019-09-05 NOTE — Patient Instructions (Signed)
Plan: Avoid frequent bending and stooping  No lifting greater than 10 lbs. May use ice or moist heat for pain. Weight loss is of benefit.  Exercise is important to improve your indurance and does allow people to function better inspite of back pain. Referral to Dr. Junius Roads for right SI joint injection for sacroiliac pain, if this helps then surgery may not be indicated and  Treatment directed at the SI joint would be needed.

## 2019-09-05 NOTE — Addendum Note (Signed)
Addended by: Basil Dess on: 09/05/2019 10:46 AM   Modules accepted: Orders

## 2019-09-05 NOTE — Addendum Note (Signed)
Addended by: Hortencia Pilar on: 09/05/2019 11:09 AM   Modules accepted: Orders

## 2019-09-05 NOTE — Progress Notes (Signed)
Subjective: Patient is here for ultrasound-guided intra-articular right SI joint injection.   Ongoing pain status post lumbar fusion.  Objective: She is point tender over the right SI joint.  Procedure: Ultrasound-guided right SI injection: After sterile prep with Betadine, injected 5 cc 1% lidocaine without epinephrine and 40 mg methylprednisolone using a 22-gauge spinal needle, passing the needle through the sacroiliac ligament into the region of the SI joint.  She had a feeling of fullness in her ears after the procedure.  I looked in her ear canal and she had excessive wax buildup, unable to visualize the tympanic membrane.  Regarding her SI pain, she had modest improvement during the immediate anesthetic phase.

## 2019-09-05 NOTE — Progress Notes (Signed)
Office Visit Note   [name]           Date of Birth: 1951-01-14           MRN: [id number] Visit Date: 09/05/2019              Requested by: [name], MD [address] [place],  [place] [address] PCP: [name], MD   Assessment & Plan: Visit Diagnoses:  1. Pain of right sacroiliac joint   2. Degenerative disc disease, lumbar   3. DDD (degenerative disc disease), thoracolumbar   4. History of fusion of lumbar spine     Plan: Avoid frequent bending and stooping  No lifting greater than 10 lbs. May use ice or moist heat for pain. Weight loss is of benefit.  Exercise is important to improve your indurance and does allow people to function better inspite of back pain. Referral to Dr. Junius Roads for right SI joint injection for sacroiliac pain, if this helps then surgery may not be indicated and  Treatment directed at the SI joint would be needed.    Follow-Up Instructions: Return see Dr. Junius Roads for right ultrasound directed sacroiliac joint injection.   Orders:  No orders of the defined types were placed in this encounter.  No orders of the defined types were placed in this encounter.     Procedures: No procedures performed   Clinical Data: No additional findings.   Subjective: Chief Complaint  Patient presents with  . Lower Back - Follow-up    had Right L1-2 IL w/ Dr. [name] & she states she hasn't had any relief @ all w/ the injection. She is getting frustrated & if she needs surgery she wants to go ahead & get it scheduled so she can do things she wants/needs to do.    69 year old female with L2 to S1 fusion with a disc at S1-2 partially sacralized, MRI negative here. She is experiencing increasing upper lumbar pain but also pain in the right SI area. Pain with standing and walking and recent right L1-2 ESI gave no improvement.  Pain is  Worse with bending and stooping and lifting. First in the AM is not painful, but with  standing and walking and making breakfast the pain  Worsens. Standing and walking increases the pain. Pain in the right buttock and worse with standing and walking. No bowel or bladder difficulty. Went to the beach but could not walk due to back pain. Daughter bought her a scooter to get around.    Review of Systems   Objective: Vital Signs: BP (!) 145/71 (BP Location: Left Arm, Patient Position: Sitting)   Pulse (!) 56   Ht 5\' 4"  (1.626 m)   Wt 210 lb (95.3 kg)   BMI 36.05 kg/m   Physical Exam Constitutional:      Appearance: She is well-developed.  HENT:     Head: Normocephalic and atraumatic.  Eyes:     Pupils: Pupils are equal, round, and reactive to light.  Pulmonary:     Effort: Pulmonary effort is normal.     Breath sounds: Normal breath sounds.  Abdominal:     General: Bowel sounds are normal.     Palpations: Abdomen is soft.  Musculoskeletal:     Cervical back: Normal range of motion and neck supple.     Lumbar back: Negative right straight leg raise test and negative left straight leg raise test.  Skin:  General: Skin is warm and dry.  Neurological:     Mental Status: She is alert and oriented to person, place, and time.  Psychiatric:        Behavior: Behavior normal.        Thought Content: Thought content normal.        Judgment: Judgment normal.     Back Exam   Tenderness  The patient is experiencing tenderness in the lumbar and sacroiliac.  Range of Motion  Extension: abnormal  Flexion: abnormal  Lateral bend right: abnormal  Lateral bend left: abnormal  Rotation right: abnormal  Rotation left: abnormal   Muscle Strength  Right Quadriceps:  5/5  Left Quadriceps:  5/5  Right Hamstrings:  5/5   Tests  Straight leg raise right: negative Straight leg raise left: negative  Reflexes  Patellar: 0/4 Achilles: 0/4 Babinski's sign: normal   Other  Toe walk: abnormal Heel walk: abnormal Sensation: decreased Gait: antalgic  Erythema: no  back redness Scars: absent  Comments:  Right SI is tender to touch and painful.      Specialty Comments:  No specialty comments available.  Imaging: No results found.   PMFS History: Patient Active Problem List   Diagnosis Date Noted  . Herniated nucleus pulposus, L2-3 right 02/04/2014    Priority: High    Class: Acute  . Lumbar stenosis with neurogenic claudication 04/30/2012    Priority: High    Class: Chronic  . Physical exam 06/26/2019  . Neck pain 04/18/2019  . Ascending aortic aneurysm ([facility]) 04/17/2019  . Hypoxia   . Nonspecific chest pain 04/15/2019  . Neuropathic pain of right flank 12/27/2018  . Dysphagia 12/27/2018  . Postsurgical hypothyroidism 06/11/2018  . Vitamin D deficiency 06/11/2018  . Multinodular goiter 06/11/2018  . Hypercalcemia 06/11/2018  . DM type 2 causing vascular disease ([facility])   . PONV (postoperative nausea and vomiting)   . Peripheral neuropathy   . Irritable bowel syndrome   . Hypertension   . History of right mastectomy   . H/O hiatal hernia   . GERD (gastroesophageal reflux disease)   . Family history of anesthesia complication   . Diabetes mellitus type 2, uncontrolled, with complications ([facility])   . Lower extremity edema   . Chronic constipation 06/23/2015  . Morbid obesity ([facility]) 06/12/2015  . Chronic diastolic heart failure ([facility]) 11/17/2014  . Spondylolisthesis of lumbar region 04/30/2012    Class: Chronic  . Anginal pain ([facility]) 03/19/2012  . SYNCOPE-CAROTID SINUS 09/16/2008  . OSTEOARTHRITIS 08/23/2006  . DDD (degenerative disc disease), cervical 08/23/2006  . [place] DISEASE, LUMBAR 08/23/2006  . Headache(784.0) 08/23/2006  . BREAST CANCER, HX OF 08/23/2006  . HYPERPARATHYROIDISM, HX OF 08/23/2006  . RENAL CALCULUS, HX OF 08/23/2006   Past Medical History:  Diagnosis Date  . Anginal pain ([facility]) 03/19/2012   saw Dr. Cathie Olden .. she thinks its more related to stomach issues  . Arthritis    "all over; mostly in my back"  (03/20/2012)  . Breast cancer ([facility])    "right" (03/20/2012)  . Cardiomyopathy   . Chest pain 06/2005   Hospitalized, dystolic dysfunction  . Chronic lower back pain    "have 2 herniated discs; going to have to have a fusion" (03/20/2012)  . CKD (chronic kidney disease), stage III   . Family history of anesthesia complication    "father has nausea and vomiting"  . Gallstones   . GERD (gastroesophageal reflux disease)   . H/O hiatal hernia   . History  of kidney stones   . History of right mastectomy   . Hypertension    "patient states never had HTN, takes Hyzaar for heart  . Hypothyroidism   . Irritable bowel syndrome   . Migraines    "it's been a long time since I've had one" (03/20/2012)  . Osteoarthritis   . Peripheral neuropathy   . PONV (postoperative nausea and vomiting)   . Sciatica   . Type II diabetes mellitus ([facility])     Family History  Problem Relation Age of Onset  . Hypertension Mother   . Diabetes Father   . Hypertension Father   . Hypertension Sister   . Hypertension Brother   . Hypertension Brother   . Diabetes Brother   . Diabetes Brother   . Diabetes Other        Family Hx. of Diabetes  . Hypertension Other        Family History of HTN  . Deep vein thrombosis Daughter   . Breast cancer Maternal Aunt   . Colon cancer Neg Hx   . Esophageal cancer Neg Hx   . Liver cancer Neg Hx   . Rectal cancer Neg Hx   . Stomach cancer Neg Hx     Past Surgical History:  Procedure Laterality Date  . ABDOMINAL HYSTERECTOMY  1993  . adenosin cardiolite  07/2005   (+) wall motion abnormality  . AXILLARY LYMPH NODE DISSECTION  2003   squamous cell cancer  . BACK SURGERY    . CARDIAC CATHETERIZATION  ? 2005 / 2007  . CHOLECYSTECTOMY N/A 01/24/2018   Procedure: LAPAROSCOPIC CHOLECYSTECTOMY;  Surgeon: Erroll Luna, MD;  Location: [facility];  Service: General;  Laterality: N/A;  . CT abdomen and pelvis  02/2010   Same  . ESOPHAGOGASTRODUODENOSCOPY  2005   GERD  .  KNEE ARTHROSCOPY  1990's   "right" (03/20/2012)  . LUMBAR FUSION N/A 08/22/2014   Procedure: Right L2-3 and right L1-2 transforaminal lumbar interbody fusion with pedicle screws, rods, sleeves, cages, local bone graft, Vivigen, cancellous chips;  Surgeon: Jessy Oto, MD;  Location: [facility];  Service: Orthopedics;  Laterality: N/A;  . LUMBAR LAMINECTOMY/DECOMPRESSION MICRODISCECTOMY N/A 02/04/2014   Procedure: RIGHT L2-3 MICRODISCECTOMY ;  Surgeon: Jessy Oto, MD;  Location: [facility];  Service: Orthopedics;  Laterality: N/A;  . MASTECTOMY Right   . MASTECTOMY WITH AXILLARY LYMPH NODE DISSECTION  12/2003   "right" (03/20/2012)  . POSTERIOR CERVICAL FUSION/FORAMINOTOMY  2001  . SHOULDER ARTHROSCOPY W/ ROTATOR CUFF REPAIR  2003   Left  . THYROIDECTOMY  1977   Right  . TOTAL KNEE ARTHROPLASTY  2000   Right  . [place] of abdomen  07/2005   Fatty liver / kidney stones   Social History   Occupational History  . Occupation: Retired since 2003  Tobacco Use  . Smoking status: Never Smoker  . Smokeless tobacco: Never Used  Substance and Sexual Activity  . Alcohol use: No  . Drug use: No  . Sexual activity: Not on file

## 2019-09-10 ENCOUNTER — Telehealth: Payer: Self-pay | Admitting: *Deleted

## 2019-09-10 NOTE — Telephone Encounter (Signed)
°  Apolonio Schneiders w/ [facility] is informing the physician that the medication that he has prescribed for patient has been identified as CSN higher side effects in Geriatric patients by the [facility]. She will send a fax that should be documented and returned.  Any questions, please call [phone].

## 2019-09-10 NOTE — Progress Notes (Signed)
**Note Desiree-Identified via Obfuscation** [facility]  Name: [name]  MRN: [id number] DOB: Jun 12, 1950  Chief Complaint/ HPI  [name],  69 y.o. , female presents for their Follow-Up CCM visit with the clinical pharmacist In office.  PCP : [name], MD  Their chronic conditions include: Type 2 DM, HF, hyperparathyroidism, HLD, HTN, GERD  Office Visits: 5/24 (Dr. Dorris Fetch, Endocrinology): a1c elevated 8.4% but down overall from 10.3% 4/14 (Dr. Henrene Pastor, Gastroenterology): c/o dysphagia, endoscopy scheduled. Continued protonix 40 mg daily, take miralax 2 doses daily for 1 week then increase by 1 dose each week   4/7 (Dr. Carlis Abbott, Pulmonology): 2021: No significant obstruction or bronchodilator reversibility.  Mild restriction.  Mild diffusion impairment. Abnormal CT demonstrating enlarged PA concerning for pulmonary hypertension.  At risk for WHO group II hypertension due to left-sided heart disease, but lack of LA dilation suggest this is less likely.  Sleep apnea predisposes to WHO group III pulmonary hypertension. Does not want to use CPAP.  Patient Active Problem List   Diagnosis Date Noted  . Physical exam 06/26/2019  . Neck pain 04/18/2019  . Ascending aortic aneurysm ([place]) 04/17/2019  . Hypoxia   . Nonspecific chest pain 04/15/2019  . Neuropathic pain of right flank 12/27/2018  . Dysphagia 12/27/2018  . Postsurgical hypothyroidism 06/11/2018  . Vitamin D deficiency 06/11/2018  . Multinodular goiter 06/11/2018  . Hypercalcemia 06/11/2018  . DM type 2 causing vascular disease ([place])   . PONV (postoperative nausea and vomiting)   . Peripheral neuropathy   . Irritable bowel syndrome   . Hypertension   . History of right mastectomy   . H/O hiatal hernia   . GERD (gastroesophageal reflux disease)   . Family history of anesthesia complication   . Diabetes mellitus type 2, uncontrolled, with complications ([place])   . Lower extremity edema   . Chronic constipation  06/23/2015  . Morbid obesity ([place]) 06/12/2015  . Chronic diastolic heart failure ([place]) 11/17/2014  . Herniated nucleus pulposus, L2-3 right 02/04/2014    Class: Acute  . Spondylolisthesis of lumbar region 04/30/2012    Class: Chronic  . Lumbar stenosis with neurogenic claudication 04/30/2012    Class: Chronic  . Anginal pain ([place]) 03/19/2012  . SYNCOPE-CAROTID SINUS 09/16/2008  . OSTEOARTHRITIS 08/23/2006  . DDD (degenerative disc disease), cervical 08/23/2006  . [place] DISEASE, LUMBAR 08/23/2006  . Headache(784.0) 08/23/2006  . BREAST CANCER, HX OF 08/23/2006  . HYPERPARATHYROIDISM, HX OF 08/23/2006  . RENAL CALCULUS, HX OF 08/23/2006   Social History   Socioeconomic History  . Marital status: Married    Spouse name: Not on file  . Number of children: 1  . Years of education: Not on file  . Highest education level: Not on file  Occupational History  . Occupation: Retired since 2003  Tobacco Use  . Smoking status: Never Smoker  . Smokeless tobacco: Never Used  Substance and Sexual Activity  . Alcohol use: No  . Drug use: No  . Sexual activity: Not on file  Other Topics Concern  . Not on file  Social History Narrative  . Not on file   Social Determinants of Health   Financial Resource Strain:   . Difficulty of Paying Living Expenses:   Food Insecurity: No Food Insecurity  . Worried About Charity fundraiser in the Last Year: Never true  . Ran Out of Food in the Last Year: Never true  Transportation Needs: No Transportation Needs  . Lack of Transportation (Medical):  No  . Lack of Transportation (Non-Medical): No  Physical Activity:   . Days of Exercise per Week:   . Minutes of Exercise per Session:   Stress:   . Feeling of Stress :   Social Connections:   . Frequency of Communication with Friends and Family:   . Frequency of Social Gatherings with Friends and Family:   . Attends Religious Services:   . Active Member of Clubs or Organizations:   . Attends English as a second language teacher Meetings:   [name] Kitchen Marital Status:    Family History  Problem Relation Age of Onset  . Hypertension Mother   . Diabetes Father   . Hypertension Father   . Hypertension Sister   . Hypertension Brother   . Hypertension Brother   . Diabetes Brother   . Diabetes Brother   . Diabetes Other        Family Hx. of Diabetes  . Hypertension Other        Family History of HTN  . Deep vein thrombosis Daughter   . Breast cancer Maternal Aunt   . Colon cancer Neg Hx   . Esophageal cancer Neg Hx   . Liver cancer Neg Hx   . Rectal cancer Neg Hx   . Stomach cancer Neg Hx    Allergies  Allergen Reactions  . Dilaudid [Hydromorphone Hcl] Nausea And Vomiting  . Lipitor [Atorvastatin] Other (See Comments)    Body & Muscle Aches  . Adhesive [Tape] Rash  . Ampicillin Rash and Other (See Comments)    REACTION: Rash and welts on her hands when she took it in the hospital  . Latex Itching, Dermatitis, Rash and Other (See Comments)    REACTION: Patient had a reaction to tape after surgery and they told her she was allergic to Latex   . Penicillins Rash and Other (See Comments)    Has patient had a PCN reaction causing immediate rash, facial/tongue/throat swelling, SOB or lightheadedness with hypotension: No Has patient had a PCN reaction causing severe rash involving mucus membranes or skin necrosis: No Has patient had a PCN reaction that required hospitalization: Yes - in hospital receiving treatment Has patient had a PCN reaction occurring within the last 10 years: No If all of the above answers are "NO", then may proceed with Cephalosporin use.    Outpatient Encounter Medications as of 09/11/2019  Medication Sig  . acetaminophen (TYLENOL) 500 MG tablet Take 1,000 mg by mouth every 6 (six) hours as needed for moderate pain or headache.  . albuterol (VENTOLIN HFA) 108 (90 Base) MCG/ACT inhaler Inhale 2 puffs into the lungs every 6 (six) hours as needed for wheezing or shortness of breath.   . Alcohol Swabs (B-D SINGLE USE SWABS REGULAR) PADS Use as directed. (Patient not taking: Reported on 08/14/2019)  . amLODipine (NORVASC) 10 MG tablet TAKE 1 TABLET BY MOUTH EVERY DAY  . aspirin 81 MG tablet Take 81 mg by mouth daily.  . Blood Glucose Calibration (TRUE METRIX LEVEL 2) Normal SOLN Use as directed  . Blood Glucose Monitoring Suppl (TRUE METRIX AIR GLUCOSE METER) w/Device KIT 1 kit by Does not apply route 3 (three) times daily.  . carvedilol (COREG) 6.25 MG tablet Take 1 tablet (6.25 mg total) by mouth 2 (two) times daily with a meal.  . clotrimazole-betamethasone (LOTRISONE) cream Apply 1 application topically 2 (two) times daily.  . Continuous Blood Gluc Sensor (FREESTYLE LIBRE 14 DAY SENSOR) MISC Inject 1 each into the skin every 14 (fourteen)  days. Use as directed.  . diclofenac Sodium (VOLTAREN) 1 % GEL Apply 4 g topically 4 (four) times daily as needed.  . ferrous gluconate (FERGON) 324 MG tablet Take 1 tablet (324 mg total) by mouth 2 (two) times daily with a meal.  . fluticasone (FLONASE) 50 MCG/ACT nasal spray Place 2 sprays into both nostrils daily.  . furosemide (LASIX) 40 MG tablet TAKE 1 TABLET (40 MG TOTAL) BY MOUTH DAILY AS NEEDED (LEG SWELLING).  [name] Kitchen glipiZIDE (GLUCOTROL XL) 5 MG 24 hr tablet Take 1 tablet (5 mg total) by mouth daily with breakfast.  . glucose blood (TRUE METRIX BLOOD GLUCOSE TEST) test strip Use as instructed  . insulin NPH-regular Human (70-30) 100 UNIT/ML injection Inject 60 Units into the skin See admin instructions. 60 units with breakfast and 60 units with supper for BG above 90  . levothyroxine (SYNTHROID) 100 MCG tablet Take 1 tablet (100 mcg total) by mouth daily.  [name] Kitchen losartan (COZAAR) 50 MG tablet Take 50 mg by mouth daily.  . metoCLOPramide (REGLAN) 10 MG tablet Take 1 tablet (10 mg total) by mouth 4 (four) times daily.  . pantoprazole (PROTONIX) 40 MG tablet Take 1 tablet (40 mg total) by mouth 2 (two) times daily.  . polyethylene glycol  (MIRALAX / GLYCOLAX) packet Take 17 g by mouth daily as needed for moderate constipation.  . pregabalin (LYRICA) 75 MG capsule TAKE 1 CAPSULE BY MOUTH TWICE A DAY  . rosuvastatin (CRESTOR) 20 MG tablet Take 1 tablet (20 mg total) by mouth daily at 6 PM.  . traMADol (ULTRAM) 50 MG tablet Take 2 tablets (100 mg total) by mouth every 6 (six) hours as needed for up to 7 days.  . TRUEplus Lancets 33G MISC Use as directed   Facility-Administered Encounter Medications as of 09/11/2019  Medication  . 0.9 %  sodium chloride infusion   Current Diagnosis/Assessment: Goals Addressed   None    Hypertension / Heart failure   Office blood pressures are  BP Readings from Last 3 Encounters:  09/05/19 (!) 145/71  09/02/19 135/80  08/22/19 (!) 165/80   Echocardiogram 04/17/2019: LVEF 55 to 60%, severe LVH, asymmetric hypertrophy with thickened basilar septum.  Indeterminate diastolic function.  Normal LA, mildly enlarged RV with normal function, normal RA.  Mild MR, trivial PR.  Normal PASP.  Mild dilation of the ascending aorta.  Patient is currently taking:  Carvedilol 6.25 mg twice daily with meal (high blood pressure & heart failure)  Furosemide 40 mg as needed (removes excess water in the body / reduces swelling in legs)  Losartan 50 mg once daily (high blood pressure & reduces kidney damage / protein in the urine in patients with diabetes)  Amlodipine 10 mg daily (high blood pressure)  Denies dizziness, SOB, chest pain. We reviewed the indications and proper administration for each medication.  We discussed diet recommendations at length.   Plan Continue current medications and control with diet and exercise.   Diabetes   Recent Relevant Labs: Lab Results  Component Value Date/Time   HGBA1C 8.4 (H) 08/05/2019 03:15 PM   HGBA1C 8.9 (A) 07/03/2019 03:54 PM   HGBA1C 10.3 (H) 03/01/2019 02:07 PM   MICROALBUR 570.4 08/05/2019 03:15 PM   MICROALBUR 188.7 06/01/2018 03:25 PM   Kidney  Function Lab Results  Component Value Date/Time   CREATININE 1.55 (H) 08/05/2019 03:15 PM   CREATININE 2.07 (H) 04/20/2019 02:30 AM   CREATININE 2.13 (H) 04/19/2019 04:58 AM   CREATININE 1.39 (H) 03/01/2019  02:07 PM   GFR 114.68 10/14/2010 09:16 AM   GFRNONAA 24 (L) 04/20/2019 02:30 AM   GFRNONAA 38 (L) 03/01/2019 02:07 PM   GFRAA 27 (L) 04/20/2019 02:30 AM   GFRAA 44 (L) 03/01/2019 02:07 PM   07/2019 a1c 8.4%, down from 10.3% 02/2019. Routine diet or meal planning has been difficult. Has seen Jearld Fenton, RDN, CDE once for individualized diabetes education. Patient reports she was seen 09/11/2019 by [name]. MD at [facility] to discuss CKD.  Reports monitoring glucose at least once daily in morning. Has been 110s-140s within last week. Taken off of metformin due to CKD. Denies any episodes of low blood sugar. Patient is currently uncontrolled on the following medications:   Insulin-NPH-regular Human (70-30) 60 units twice daily (Type 2 Diabetes)  Glipizide xl 5 mg daily (Type 2 Diabetes)  Plan Continue current medications and control with diet and exercise.   Hypothyroidism    Lab Results  Component Value Date   TSH 1.18 08/05/2019   TSH 2.13 03/01/2019   TSH 1.31 10/02/2018   TSH consistently within normal range since 09/2018.  Patient is currently controlled on the following medications:   Levothyroxine 100 mcg daily (thyroid hormone replacement)  Plan  Continue current medications.  Acid reflux / gastroparesis / constipation    Reports ongoing sense of fullness, occasional vomiting, and acid reflux.  Has prescriptions for Reglan and Pantoprazole. States some improvement with pantoprazole, doesn't seem to be using Reglan. Iron supplement may be contributing to constipation. Laxative/stool softener encouraged. Discussed triggers at length and consistent use of medications to help gage their benefit. Patient is currently uncontrolled on the following  medications:  . Pantoprazole 40 mg once daily (acid reflux) . Metoclopramide 10 mg four times daily (helps reduce feeling full/bloated after you start eating, also helps with acid reflux)  Plan  Continue current medications and control with diet and exercise.  Hyperlipidemia   Lipid Panel     Component Value Date/Time   CHOL 155 08/05/2019 1515   TRIG 269 (H) 08/05/2019 1515   HDL 41 (L) 08/05/2019 1515   CHOLHDL 3.8 08/05/2019 1515   VLDL 42 (H) 04/17/2019 0227   LDLCALC 79 08/05/2019 1515   The 10-year ASCVD risk score Mikey Bussing DC Jr., et al., 2013) is: 25.8%   Values used to calculate the score:     Age: 42 years     Sex: Female     Is Non-Hispanic African American: Yes     Diabetic: Yes     Tobacco smoker: No     Systolic Blood Pressure: 324 mmHg     Is BP treated: Yes     HDL Cholesterol: 41 mg/dL     Total Cholesterol: 155 mg/dL   Cholesterol controlled at last visit, triglycerides elevated. Remains unclear if rosuvastatin is being used, patient did not have with other medications in office. Provided with reminder note to start back on rosuvastatin.   Currently prescribed:  Rosuvastatin 40 mg daily (helps lower lipids/cholesterol and reduces risk of heart attack and stroke)  Plan Continue current medications.   Vaccines   Reviewed patient's vaccination history.  Due for Shingrix.    Immunization History  Administered Date(s) Administered  . Influenza Split 01/31/2011  . Influenza Whole 02/23/2007  . Influenza, High Dose Seasonal PF 01/10/2017  . Influenza,inj,Quad PF,6+ Mos 01/07/2014, 02/09/2016, 01/01/2018, 12/03/2018  . Influenza-Unspecified 01/10/2012  . PFIZER SARS-COV-2 Vaccination 05/24/2019, 06/18/2019  . Pneumococcal Conjugate-13 05/16/2019  . Pneumococcal Polysaccharide-23 08/25/2014  .  Tdap 04/20/2011   Plan  Recommended patient receive Shingrix vaccine in pharmacy.   Medication Management   Pt uses Patent attorney for all  medications. Uses pill box? No.  Current insurance mail order provides lowest copay.   Plan  Continue current medication management strategy.  Follow up: 3 month phone visit.   ______________ Visit Information SDOH (Social Determinants of Health) assessments performed: Yes.  [name] was given information about Chronic Care Management services today including:  1. CCM service includes personalized support from designated clinical staff supervised by her physician, including individualized plan of care and coordination with other care providers 2. 24/7 contact phone numbers for assistance for urgent and routine care needs. 3. Standard insurance, coinsurance, copays and deductibles apply for chronic care management only during months in which we provide at least 20 minutes of these services. Most insurances cover these services at 100%, however patients may be responsible for any copay, coinsurance and/or deductible if applicable. This service may help you avoid the need for more expensive face-to-face services. 4. Only one practitioner may furnish and bill the service in a calendar month. 5. The patient may stop CCM services at any time (effective at the end of the month) by phone call to the office staff.  Patient agreed to services and verbal consent obtained.   [name], Pharm.D., BCGP Clinical Pharmacist [facility] at [facility] [phone]

## 2019-09-11 ENCOUNTER — Other Ambulatory Visit: Payer: Self-pay

## 2019-09-11 ENCOUNTER — Ambulatory Visit: Payer: Medicare HMO

## 2019-09-11 DIAGNOSIS — R768 Other specified abnormal immunological findings in serum: Secondary | ICD-10-CM | POA: Diagnosis not present

## 2019-09-11 DIAGNOSIS — I5032 Chronic diastolic (congestive) heart failure: Secondary | ICD-10-CM

## 2019-09-11 DIAGNOSIS — N200 Calculus of kidney: Secondary | ICD-10-CM | POA: Diagnosis not present

## 2019-09-11 DIAGNOSIS — K219 Gastro-esophageal reflux disease without esophagitis: Secondary | ICD-10-CM

## 2019-09-11 DIAGNOSIS — I129 Hypertensive chronic kidney disease with stage 1 through stage 4 chronic kidney disease, or unspecified chronic kidney disease: Secondary | ICD-10-CM | POA: Diagnosis not present

## 2019-09-11 DIAGNOSIS — I10 Essential (primary) hypertension: Secondary | ICD-10-CM

## 2019-09-11 DIAGNOSIS — E1159 Type 2 diabetes mellitus with other circulatory complications: Secondary | ICD-10-CM

## 2019-09-11 DIAGNOSIS — R809 Proteinuria, unspecified: Secondary | ICD-10-CM | POA: Diagnosis not present

## 2019-09-11 DIAGNOSIS — E1122 Type 2 diabetes mellitus with diabetic chronic kidney disease: Secondary | ICD-10-CM | POA: Diagnosis not present

## 2019-09-11 DIAGNOSIS — N1831 Chronic kidney disease, stage 3a: Secondary | ICD-10-CM | POA: Diagnosis not present

## 2019-09-11 DIAGNOSIS — I272 Pulmonary hypertension, unspecified: Secondary | ICD-10-CM | POA: Diagnosis not present

## 2019-09-11 DIAGNOSIS — IMO0002 Reserved for concepts with insufficient information to code with codable children: Secondary | ICD-10-CM

## 2019-09-12 NOTE — Patient Instructions (Signed)
Please call me at [phone] (direct line) with any questions - thank you!  - Edyth Gunnels., Clinical Pharmacist  Goals Addressed            This Visit's Progress   . PharmD Care Plan       CARE PLAN ENTRY  Current Barriers:  . Chronic Disease Management support, education, and care coordination needs related to Hypertension, Hyperlipidemia, Diabetes, Heart Failure, and GERD/Gastroparesis   Hypertension / Heart Failure  . Pharmacist Clinical Goal(s): o Over the next 90 days, patient will work with PharmD and providers to maintain BP goal <140/90 . Current regimen:   Carvedilol 6.25 mg twice daily with meal (high blood pressure & heart failure)  Furosemide 40 mg as needed (removes excess water in the body / reduces swelling in legs)  Losartan 50 mg once daily (high blood pressure & reduces kidney damage / protein in the urine in patients with diabetes)  Amlodipine 10 mg daily (high blood pressure) . Interventions: o Continue current management . Patient self care activities - Over the next 90 days, patient will: o Check BP at least once every 1-2 weeks, document, and provide at future appointments o Ensure daily salt intake < 2300 mg/day  Hyperlipidemia . Pharmacist Clinical Goal(s): o Over the next 90 days, patient will work with PharmD and providers to maintain LDL goal < 100 . Current regimen:   Rosuvastatin 40 mg daily (helps lower lipids/cholesterol and reduces risk of heart attack and stroke) . Interventions: o Continue current management . Patient self care activities - Over the next 90 days, patient will: o Continue current management   Diabetes . Pharmacist Clinical Goal(s): o Over the next 90 days, patient will work with PharmD and providers to achieve A1c goal <8% . Current regimen:   Insulin-NPH-regular Human (70-30) 60 units twice daily (Type 2 Diabetes)  Glipizide xl 5 mg daily (Type 2 Diabetes) . Interventions: o Continue current management . Patient  self care activities - Over the next 90 days, patient will: o Check blood sugar once daily, document, and provide at future appointments o Contact provider with any episodes of hypoglycemia  Acid reflux / gastroparesis . Pharmacist Clinical Goal(s) o Over the next 90 days, patient will work with PharmD and providers to minimize symptoms of acid reflux and gastroparesis . Current regimen:  . Pantoprazole 40 mg once daily (acid reflux) . Metoclopramide 10 mg four times daily (helps reduce feeling full/bloated after you start eating, also helps with acid reflux) . Interventions: o Continue current management . Patient self care activities - Over the next 90 days, patient will: o Continue current management  Medication management . Pharmacist Clinical Goal(s): o Over the next 90 days, patient will work with PharmD and providers to achieve optimal medication adherence . Current pharmacy: [facility] . Interventions o Comprehensive medication review performed. o Continue current medication management strategy . Patient self care activities - Over the next 90 days, patient will: o Take medications as prescribed o Report any questions or concerns to PharmD and/or provider(s)      The patient verbalized understanding of instructions provided today and agreed to receive a mailed copy of patient instruction and/or educational materials. Telephone follow up appointment with pharmacy team member scheduled for: See next appointment with "Care Management Staff" under "What's Next" below.   Thank you!  [name], Pharm.D., BCGP Clinical Pharmacist [facility] [phone] Hypertension, Adult High blood pressure (hypertension) is when the force of blood  pumping through the arteries is too strong. The arteries are the blood vessels that carry blood from the heart throughout the body. Hypertension forces the heart to work harder to pump blood and may cause  arteries to become narrow or stiff. Untreated or uncontrolled hypertension can cause a heart attack, heart failure, a stroke, kidney disease, and other problems. A blood pressure reading consists of a higher number over a lower number. Ideally, your blood pressure should be below 120/80. The first ("top") number is called the systolic pressure. It is a measure of the pressure in your arteries as your heart beats. The second ("bottom") number is called the diastolic pressure. It is a measure of the pressure in your arteries as the heart relaxes. What are the causes? The exact cause of this condition is not known. There are some conditions that result in or are related to high blood pressure. What increases the risk? Some risk factors for high blood pressure are under your control. The following factors may make you more likely to develop this condition:  Smoking.  Having type 2 diabetes mellitus, high cholesterol, or both.  Not getting enough exercise or physical activity.  Being overweight.  Having too much fat, sugar, calories, or salt (sodium) in your diet.  Drinking too much alcohol. Some risk factors for high blood pressure may be difficult or impossible to change. Some of these factors include:  Having chronic kidney disease.  Having a family history of high blood pressure.  Age. Risk increases with age.  Race. You may be at higher risk if you are African American.  Gender. Men are at higher risk than women before age 1. After age 17, women are at higher risk than men.  Having obstructive sleep apnea.  Stress. What are the signs or symptoms? High blood pressure may not cause symptoms. Very high blood pressure (hypertensive crisis) may cause:  Headache.  Anxiety.  Shortness of breath.  Nosebleed.  Nausea and vomiting.  Vision changes.  Severe chest pain.  Seizures. How is this diagnosed? This condition is diagnosed by measuring your blood pressure while you  are seated, with your arm resting on a flat surface, your legs uncrossed, and your feet flat on the floor. The cuff of the blood pressure monitor will be placed directly against the skin of your upper arm at the level of your heart. It should be measured at least twice using the same arm. Certain conditions can cause a difference in blood pressure between your right and left arms. Certain factors can cause blood pressure readings to be lower or higher than normal for a short period of time:  When your blood pressure is higher when you are in a health care provider's office than when you are at home, this is called white coat hypertension. Most people with this condition do not need medicines.  When your blood pressure is higher at home than when you are in a health care provider's office, this is called masked hypertension. Most people with this condition may need medicines to control blood pressure. If you have a high blood pressure reading during one visit or you have normal blood pressure with other risk factors, you may be asked to:  Return on a different day to have your blood pressure checked again.  Monitor your blood pressure at home for 1 week or longer. If you are diagnosed with hypertension, you may have other blood or imaging tests to help your health care provider understand your overall  risk for other conditions. How is this treated? This condition is treated by making healthy lifestyle changes, such as eating healthy foods, exercising more, and reducing your alcohol intake. Your health care provider may prescribe medicine if lifestyle changes are not enough to get your blood pressure under control, and if:  Your systolic blood pressure is above 130.  Your diastolic blood pressure is above 80. Your personal target blood pressure may vary depending on your medical conditions, your age, and other factors. Follow these instructions at home: Eating and drinking   Eat a diet that is  high in fiber and potassium, and low in sodium, added sugar, and fat. An example eating plan is called the DASH (Dietary Approaches to Stop Hypertension) diet. To eat this way: ? Eat plenty of fresh fruits and vegetables. Try to fill one half of your plate at each meal with fruits and vegetables. ? Eat whole grains, such as whole-wheat pasta, brown rice, or whole-grain bread. Fill about one fourth of your plate with whole grains. ? Eat or drink low-fat dairy products, such as skim milk or low-fat yogurt. ? Avoid fatty cuts of meat, processed or cured meats, and poultry with skin. Fill about one fourth of your plate with lean proteins, such as fish, chicken without skin, beans, eggs, or tofu. ? Avoid pre-made and processed foods. These tend to be higher in sodium, added sugar, and fat.  Reduce your daily sodium intake. Most people with hypertension should eat less than 1,500 mg of sodium a day.  Do not drink alcohol if: ? Your health care provider tells you not to drink. ? You are pregnant, may be pregnant, or are planning to become pregnant.  If you drink alcohol: ? Limit how much you use to:  0-1 drink a day for women.  0-2 drinks a day for men. ? Be aware of how much alcohol is in your drink. In the [place] one drink equals one 12 oz bottle of beer (355 mL), one 5 oz glass of wine (148 mL), or one 1 oz glass of hard liquor (44 mL). Lifestyle   Work with your health care provider to maintain a healthy body weight or to lose weight. Ask what an ideal weight is for you.  Get at least 30 minutes of exercise most days of the week. Activities may include walking, swimming, or biking.  Include exercise to strengthen your muscles (resistance exercise), such as Pilates or lifting weights, as part of your weekly exercise routine. Try to do these types of exercises for 30 minutes at least 3 days a week.  Do not use any products that contain nicotine or tobacco, such as cigarettes, e-cigarettes,  and chewing tobacco. If you need help quitting, ask your health care provider.  Monitor your blood pressure at home as told by your health care provider.  Keep all follow-up visits as told by your health care provider. This is important. Medicines  Take over-the-counter and prescription medicines only as told by your health care provider. Follow directions carefully. Blood pressure medicines must be taken as prescribed.  Do not skip doses of blood pressure medicine. Doing this puts you at risk for problems and can make the medicine less effective.  Ask your health care provider about side effects or reactions to medicines that you should watch for. Contact a health care provider if you:  Think you are having a reaction to a medicine you are taking.  Have headaches that keep coming back (recurring).  Feel dizzy.  Have swelling in your ankles.  Have trouble with your vision. Get help right away if you:  Develop a severe headache or confusion.  Have unusual weakness or numbness.  Feel faint.  Have severe pain in your chest or abdomen.  Vomit repeatedly.  Have trouble breathing. Summary  Hypertension is when the force of blood pumping through your arteries is too strong. If this condition is not controlled, it may put you at risk for serious complications.  Your personal target blood pressure may vary depending on your medical conditions, your age, and other factors. For most people, a normal blood pressure is less than 120/80.  Hypertension is treated with lifestyle changes, medicines, or a combination of both. Lifestyle changes include losing weight, eating a healthy, low-sodium diet, exercising more, and limiting alcohol. This information is not intended to replace advice given to you by your health care provider. Make sure you discuss any questions you have with your health care provider. Document Revised: 12/06/2017 Document Reviewed: 12/06/2017 Elsevier Patient  Education  2020 [employer].

## 2019-09-13 ENCOUNTER — Ambulatory Visit
Admission: RE | Admit: 2019-09-13 | Discharge: 2019-09-13 | Disposition: A | Discharge status: Home / Self Care | Payer: Medicare HMO | Source: Ambulatory Visit | Attending: Nephrology | Admitting: Nephrology

## 2019-09-13 DIAGNOSIS — I10 Essential (primary) hypertension: Secondary | ICD-10-CM | POA: Diagnosis not present

## 2019-09-13 DIAGNOSIS — H6122 Impacted cerumen, left ear: Secondary | ICD-10-CM | POA: Diagnosis not present

## 2019-09-13 DIAGNOSIS — N281 Cyst of kidney, acquired: Secondary | ICD-10-CM | POA: Diagnosis not present

## 2019-09-13 DIAGNOSIS — N1832 Chronic kidney disease, stage 3b: Secondary | ICD-10-CM | POA: Diagnosis not present

## 2019-09-16 ENCOUNTER — Telehealth: Payer: Self-pay | Admitting: Family Medicine

## 2019-09-16 ENCOUNTER — Telehealth: Payer: Self-pay | Admitting: "Endocrinology

## 2019-09-16 NOTE — Telephone Encounter (Signed)
She is on insulin twice a day, has to monitor glucose as directed 4 times a day, may call back if <70 or >300 x 3. Cramps are not directly due to blood glucose, if they continue , she has to see PMD for labs /evaluation.

## 2019-09-16 NOTE — Telephone Encounter (Signed)
Patient advised, verbalized understanding

## 2019-09-16 NOTE — Telephone Encounter (Signed)
Patient scheduled with [facility] for 6/8.

## 2019-09-16 NOTE — Telephone Encounter (Signed)
Patient called and states that she thinks her sugar may of dropped last night as she woke up in a sweat all over and she thought she might of had a accident but states it was up to her head. Patient checked sugar when she got up at 930 and it was 114. She would like a nurse to call her back [phone]

## 2019-09-16 NOTE — Telephone Encounter (Signed)
Patient states that she woke up in the middle of the night with her shoulders wet - She doesn't know why she would be having this symptom - Patient also complains of leg cramps during the night. - Please call patient - she spoke with her diabetic doctor and he told her to get in touch with you.

## 2019-09-16 NOTE — Telephone Encounter (Signed)
Spoke with patient, she did not check her blood sugar in the night, she is assuming her blood sugar dropped because when she woke up this morning her pillow was wet. Asked for readings from the past few days and she stated that she is not good at keeping track of them,she stated it has been running [phone]. Advised pt to keep a log as it will help. Also stated that she has been having leg cramps in right leg at night, almost nightly ,for the past week or so, was wondering if this has anything to do with blood sugars.

## 2019-09-16 NOTE — Telephone Encounter (Signed)
Pt would need an appt correct?

## 2019-09-16 NOTE — Telephone Encounter (Signed)
Needs appt to discuss/evaluate

## 2019-09-16 NOTE — Telephone Encounter (Signed)
Can we call and schedule pt?

## 2019-09-17 ENCOUNTER — Other Ambulatory Visit: Payer: Self-pay

## 2019-09-17 ENCOUNTER — Encounter: Payer: Self-pay | Admitting: Physician Assistant

## 2019-09-17 ENCOUNTER — Ambulatory Visit (INDEPENDENT_AMBULATORY_CARE_PROVIDER_SITE_OTHER): Payer: Medicare HMO | Admitting: Physician Assistant

## 2019-09-17 VITALS — BP 118/66 | HR 64 | Temp 97.8°F | Ht 64.0 in | Wt 205.0 lb

## 2019-09-17 DIAGNOSIS — E1165 Type 2 diabetes mellitus with hyperglycemia: Secondary | ICD-10-CM

## 2019-09-17 DIAGNOSIS — E118 Type 2 diabetes mellitus with unspecified complications: Secondary | ICD-10-CM | POA: Diagnosis not present

## 2019-09-17 DIAGNOSIS — E1122 Type 2 diabetes mellitus with diabetic chronic kidney disease: Secondary | ICD-10-CM | POA: Diagnosis not present

## 2019-09-17 DIAGNOSIS — Z794 Long term (current) use of insulin: Secondary | ICD-10-CM | POA: Diagnosis not present

## 2019-09-17 DIAGNOSIS — IMO0002 Reserved for concepts with insufficient information to code with codable children: Secondary | ICD-10-CM

## 2019-09-17 DIAGNOSIS — R7309 Other abnormal glucose: Secondary | ICD-10-CM | POA: Diagnosis not present

## 2019-09-17 DIAGNOSIS — N183 Chronic kidney disease, stage 3 unspecified: Secondary | ICD-10-CM | POA: Diagnosis not present

## 2019-09-17 LAB — POCT GLUCOSE (DEVICE FOR HOME USE): POC Glucose: 97 mg/dl (ref 70–99)

## 2019-09-17 NOTE — Patient Instructions (Signed)
Please refrain from taking the [place].  Continue insulin as directed by specialist. Make sure you are eating three meals a day. You need to check glucose three times per day -- once in the morning before breakfast, sometime in the afternoon and then before bedtime, writing all of these down.  I want you to call Dr. Liliane Channel office to let them know what is going on so you can be seen for a follow-up and further adjustments made.  If I need to set you up with a different Endocrinologist, please let me know.   Follow-up with Dr. Birdie Riddle as scheduled.

## 2019-09-17 NOTE — Progress Notes (Signed)
Patient presents to clinic today c/o an episode of sweating occurring late Sunday night/early AM Monday. States she sleeps on her right side and when she woke up realized the sheets under her R side were wet including the body. Denies any wetness/sweating of head or neck. Denies nocturnal enuresis. Got up and went to check glucose level and noted to be at 140. Notes she felt slightly tired but as she went about her morning she started to feel fine. Denies any recurrence since. Did not a prior episode of this occurring 1-2 months ago. Patient is a diabetic on insulin therapy with stage III CKD. Notes taking her insulins as directed along with 5 mg Glucotrol XL prescribed by her Endocrinologist (Dr. Dorris Fetch). Notes she does not really eat breakfast and usually has fruit for lunch. Occasionally snacks near bedtime which she did the night before symptoms occurred. Denies AMS, vision changes, tremor, nausea/vomiting, headache or abdominal pain. Denies chest pain, lightheadedness or dizziness. Feeling fine today.   Past Medical History:  Diagnosis Date  . Anginal pain ([place]) 03/19/2012   saw Dr. Cathie Olden .. she thinks its more related to stomach issues  . Arthritis    "all over; mostly in my back" (03/20/2012)  . Breast cancer ([place])    "right" (03/20/2012)  . Cardiomyopathy   . Chest pain 06/2005   Hospitalized, dystolic dysfunction  . Chronic lower back pain    "have 2 herniated discs; going to have to have a fusion" (03/20/2012)  . CKD (chronic kidney disease), stage III   . Family history of anesthesia complication    "father has nausea and vomiting"  . Gallstones   . GERD (gastroesophageal reflux disease)   . H/O hiatal hernia   . History of kidney stones   . History of right mastectomy   . Hypertension    "patient states never had HTN, takes Hyzaar for heart  . Hypothyroidism   . Irritable bowel syndrome   . Migraines    "it's been a long time since I've had one" (03/20/2012)  .  Osteoarthritis   . Peripheral neuropathy   . PONV (postoperative nausea and vomiting)   . Sciatica   . Type II diabetes mellitus ([place])     Current Outpatient Medications on File Prior to Visit  Medication Sig Dispense Refill  . acetaminophen (TYLENOL) 500 MG tablet Take 1,000 mg by mouth every 6 (six) hours as needed for moderate pain or headache.    . albuterol (VENTOLIN HFA) 108 (90 Base) MCG/ACT inhaler Inhale 2 puffs into the lungs every 6 (six) hours as needed for wheezing or shortness of breath. 8 g 0  . amLODipine (NORVASC) 10 MG tablet TAKE 1 TABLET BY MOUTH EVERY DAY 90 tablet 3  . aspirin 81 MG tablet Take 81 mg by mouth daily.    . Blood Glucose Calibration (TRUE METRIX LEVEL 2) Normal SOLN Use as directed 3 each 3  . Blood Glucose Monitoring Suppl (TRUE METRIX AIR GLUCOSE METER) w/Device KIT 1 kit by Does not apply route 3 (three) times daily. 1 kit 0  . carvedilol (COREG) 6.25 MG tablet Take 1 tablet (6.25 mg total) by mouth 2 (two) times daily with a meal. 180 tablet 1  . clotrimazole-betamethasone (LOTRISONE) cream Apply 1 application topically 2 (two) times daily. 90 g 1  . Continuous Blood Gluc Sensor (FREESTYLE LIBRE 14 DAY SENSOR) MISC Inject 1 each into the skin every 14 (fourteen) days. Use as directed. 2 each 2  .  diclofenac Sodium (VOLTAREN) 1 % GEL Apply 4 g topically 4 (four) times daily as needed. 100 g 0  . ferrous gluconate (FERGON) 324 MG tablet Take 1 tablet (324 mg total) by mouth 2 (two) times daily with a meal. 60 tablet 3  . fluticasone (FLONASE) 50 MCG/ACT nasal spray Place 2 sprays into both nostrils daily. 48 g 3  . furosemide (LASIX) 40 MG tablet TAKE 1 TABLET (40 MG TOTAL) BY MOUTH DAILY AS NEEDED (LEG SWELLING). 90 tablet 1  . glipiZIDE (GLUCOTROL XL) 5 MG 24 hr tablet Take 1 tablet (5 mg total) by mouth daily with breakfast. 30 tablet 3  . glucose blood (TRUE METRIX BLOOD GLUCOSE TEST) test strip Use as instructed 100 each 12  . insulin NPH-regular  Human (70-30) 100 UNIT/ML injection Inject 60 Units into the skin See admin instructions. 60 units with breakfast and 60 units with supper for BG above 90    . levothyroxine (SYNTHROID) 100 MCG tablet Take 1 tablet (100 mcg total) by mouth daily. 90 tablet 1  . losartan (COZAAR) 50 MG tablet Take 50 mg by mouth daily.    . metoCLOPramide (REGLAN) 10 MG tablet Take 1 tablet (10 mg total) by mouth 4 (four) times daily. 120 tablet 1  . pantoprazole (PROTONIX) 40 MG tablet Take 1 tablet (40 mg total) by mouth 2 (two) times daily. 180 tablet 1  . polyethylene glycol (MIRALAX / GLYCOLAX) packet Take 17 g by mouth daily as needed for moderate constipation.    . pregabalin (LYRICA) 75 MG capsule TAKE 1 CAPSULE BY MOUTH TWICE A DAY 60 capsule 3  . rosuvastatin (CRESTOR) 20 MG tablet Take 1 tablet (20 mg total) by mouth daily at 6 PM. 90 tablet 1  . TRUEplus Lancets 33G MISC Use as directed 300 each 3  . Alcohol Swabs (B-D SINGLE USE SWABS REGULAR) PADS Use as directed. (Patient not taking: Reported on 09/17/2019) 100 each 1   Current Facility-Administered Medications on File Prior to Visit  Medication Dose Route Frequency Provider Last Rate Last Admin  . 0.9 %  sodium chloride infusion  500 mL Intravenous Once Irene Shipper, MD        Allergies  Allergen Reactions  . Dilaudid [Hydromorphone Hcl] Nausea And Vomiting  . Lipitor [Atorvastatin] Other (See Comments)    Body & Muscle Aches  . Adhesive [Tape] Rash  . Ampicillin Rash and Other (See Comments)    REACTION: Rash and welts on her hands when she took it in the hospital  . Latex Itching, Dermatitis, Rash and Other (See Comments)    REACTION: Patient had a reaction to tape after surgery and they told her she was allergic to Latex   . Penicillins Rash and Other (See Comments)    Has patient had a PCN reaction causing immediate rash, facial/tongue/throat swelling, SOB or lightheadedness with hypotension: No Has patient had a PCN reaction causing  severe rash involving mucus membranes or skin necrosis: No Has patient had a PCN reaction that required hospitalization: Yes - in hospital receiving treatment Has patient had a PCN reaction occurring within the last 10 years: No If all of the above answers are "NO", then may proceed with Cephalosporin use.     Family History  Problem Relation Age of Onset  . Hypertension Mother   . Diabetes Father   . Hypertension Father   . Hypertension Sister   . Hypertension Brother   . Hypertension Brother   . Diabetes Brother   .  Diabetes Brother   . Diabetes Other        Family Hx. of Diabetes  . Hypertension Other        Family History of HTN  . Deep vein thrombosis Daughter   . Breast cancer Maternal Aunt   . Colon cancer Neg Hx   . Esophageal cancer Neg Hx   . Liver cancer Neg Hx   . Rectal cancer Neg Hx   . Stomach cancer Neg Hx     Social History   Socioeconomic History  . Marital status: Married    Spouse name: Not on file  . Number of children: 1  . Years of education: Not on file  . Highest education level: Not on file  Occupational History  . Occupation: Retired since 2003  Tobacco Use  . Smoking status: Never Smoker  . Smokeless tobacco: Never Used  Substance and Sexual Activity  . Alcohol use: No  . Drug use: No  . Sexual activity: Not on file  Other Topics Concern  . Not on file  Social History Narrative  . Not on file   Social Determinants of Health   Financial Resource Strain:   . Difficulty of Paying Living Expenses:   Food Insecurity: No Food Insecurity  . Worried About Charity fundraiser in the Last Year: Never true  . Ran Out of Food in the Last Year: Never true  Transportation Needs: No Transportation Needs  . Lack of Transportation (Medical): No  . Lack of Transportation (Non-Medical): No  Physical Activity:   . Days of Exercise per Week:   . Minutes of Exercise per Session:   Stress:   . Feeling of Stress :   Social Connections:   .  Frequency of Communication with Friends and Family:   . Frequency of Social Gatherings with Friends and Family:   . Attends Religious Services:   . Active Member of Clubs or Organizations:   . Attends Archivist Meetings:   [name] Kitchen Marital Status:    Review of Systems - See HPI.  All other ROS are negative.  BP 118/66   Pulse 64   Temp 97.8 F (36.6 C)   Ht '5\' 4"'  (1.626 m)   Wt 205 lb (93 kg)   SpO2 99%   BMI 35.19 kg/m   Physical Exam Vitals reviewed.  Constitutional:      Appearance: Normal appearance.  HENT:     Head: Normocephalic and atraumatic.  Eyes:     Conjunctiva/sclera: Conjunctivae normal.  Cardiovascular:     Rate and Rhythm: Normal rate and regular rhythm.     Pulses: Normal pulses.     Heart sounds: Normal heart sounds.  Pulmonary:     Effort: Pulmonary effort is normal.  Musculoskeletal:     Cervical back: Neck supple.  Neurological:     General: No focal deficit present.     Mental Status: She is alert and oriented to person, place, and time.  Psychiatric:        Mood and Affect: Mood normal.    Recent Results (from the past 2160 hour(s))  HgB A1c     Status: Abnormal   Collection Time: 07/03/19  3:54 PM  Result Value Ref Range   Hemoglobin A1C 8.9 (A) 4.0 - 5.6 %   HbA1c POC (<> result, manual entry)     HbA1c, POC (prediabetic range)     HbA1c, POC (controlled diabetic range)    SARS CORONAVIRUS 2 (TAT 6-24 HRS)  Nasopharyngeal Nasopharyngeal Swab     Status: None   Collection Time: 07/15/19 12:32 PM   Specimen: Nasopharyngeal Swab  Result Value Ref Range   SARS Coronavirus 2 NEGATIVE NEGATIVE    Comment: (NOTE) SARS-CoV-2 target nucleic acids are NOT DETECTED. The SARS-CoV-2 RNA is generally detectable in upper and lower respiratory specimens during the acute phase of infection. Negative results do not preclude SARS-CoV-2 infection, do not rule out co-infections with other pathogens, and should not be used as the sole basis for  treatment or other patient management decisions. Negative results must be combined with clinical observations, patient history, and epidemiological information. The expected result is Negative. Fact Sheet for Patients: SugarRoll.be Fact Sheet for Healthcare Providers: https://www.woods-mathews.com/ This test is not yet approved or cleared by the [place] FDA and  has been authorized for detection and/or diagnosis of SARS-CoV-2 by FDA under an Emergency Use Authorization (EUA). This EUA will remain  in effect (meaning this test can be used) for the duration of the COVID-19 declaration under Section 56 4(b)(1) of the Act, 21 U.S.C. section 360bbb-3(b)(1), unless the authorization is terminated or revoked sooner. Performed at [facility], [place] [address] [place], [place] [address]   Pulmonary function test     Status: None (Preliminary result)   Collection Time: 07/17/19  9:06 AM  Result Value Ref Range   FVC-Pre 1.75 L   FVC-%Pred-Pre 70 %   FVC-Post 1.74 L   FVC-%Pred-Post 70 %   FVC-%Change-Post 0 %   FEV1-Pre 1.53 L   FEV1-%Pred-Pre 79 %   FEV1-Post 1.56 L   FEV1-%Pred-Post 81 %   FEV1-%Change-Post 2 %   FEV6-Pre 1.75 L   FEV6-%Pred-Pre 73 %   FEV6-Post 1.74 L   FEV6-%Pred-Post 72 %   FEV6-%Change-Post 0 %   Pre FEV1/FVC ratio 87 %   FEV1FVC-%Pred-Pre 112 %   Post FEV1/FVC ratio 90 %   FEV1FVC-%Change-Post 3 %   Pre FEV6/FVC Ratio 100 %   FEV6FVC-%Pred-Pre 103 %   Post FEV6/FVC ratio 100 %   FEV6FVC-%Pred-Post 103 %   FEV6FVC-%Change-Post 0 %   FEF 25-75 Pre 2.18 L/sec   FEF2575-%Pred-Pre 123 %   FEF 25-75 Post 2.45 L/sec   FEF2575-%Pred-Post 138 %   FEF2575-%Change-Post 12 %   RV 1.58 L   RV % pred 70 %   TLC 3.50 L   TLC % pred 67 %   DLCO unc 11.99 ml/min/mmHg   DLCO unc % pred 59 %   DLCO cor 11.99 ml/min/mmHg   DLCO cor % pred 59 %   DL/VA 3.83 ml/min/mmHg/L   DL/VA % pred 93 %  Methylmalonic Acid      Status: None   Collection Time: 07/17/19 11:11 AM  Result Value Ref Range   Methylmalonic Acid, Quant 264 87 - 318 nmol/L    Comment: . This test was developed and its analytical performance characteristics have been determined by [place], [place]. It has not been cleared or approved by the U.S. Food and Drug Administration. This assay has been validated pursuant to the CLIA regulations and is used for clinical purposes. .   B12     Status: Abnormal   Collection Time: 07/17/19 11:11 AM  Result Value Ref Range   Vitamin B-12 173 (L) 211 - 911 pg/mL  Iron     Status: None   Collection Time: 07/17/19 11:11 AM  Result Value Ref Range   Iron 44 42 - 145  ug/dL  CBC w/Diff     Status: Abnormal   Collection Time: 07/17/19 11:11 AM  Result Value Ref Range   WBC 8.7 4.0 - 10.5 K/uL   RBC 4.43 3.87 - 5.11 Mil/uL   Hemoglobin 12.7 12.0 - 15.0 g/dL   HCT 38.4 36.0 - 46.0 %   MCV 86.6 78.0 - 100.0 fl   MCHC 33.1 30.0 - 36.0 g/dL   RDW 15.7 (H) 11.5 - 15.5 %   Platelets 310.0 150.0 - 400.0 K/uL   Neutrophils Relative % 57.6 43.0 - 77.0 %   Lymphocytes Relative 31.0 12.0 - 46.0 %   Monocytes Relative 6.7 3.0 - 12.0 %   Eosinophils Relative 3.6 0.0 - 5.0 %   Basophils Relative 1.1 0.0 - 3.0 %   Neutro Abs 5.0 1.4 - 7.7 K/uL   Lymphs Abs 2.7 0.7 - 4.0 K/uL   Monocytes Absolute 0.6 0.1 - 1.0 K/uL   Eosinophils Absolute 0.3 0.0 - 0.7 K/uL   Basophils Absolute 0.1 0.0 - 0.1 K/uL  IBC + Ferritin     Status: Abnormal   Collection Time: 07/17/19 11:11 AM  Result Value Ref Range   Iron 44 42 - 145 ug/dL   Transferrin 254.0 212.0 - 360.0 mg/dL   Saturation Ratios 12.4 (L) 20.0 - 50.0 %   Ferritin 191.5 10.0 - 291.0 ng/mL  Iron,Total/Total Iron Binding Cap     Status: Abnormal   Collection Time: 07/17/19 11:11 AM  Result Value Ref Range   Iron 43 (L) 45 - 160 mcg/dL   TIBC 310 250 - 450 mcg/dL (calc)   %SAT 14 (L) 16 - 45 % (calc)  Microalbumin /  creatinine urine ratio     Status: Abnormal   Collection Time: 08/05/19  3:15 PM  Result Value Ref Range   Creatinine, Urine 168 20 - 275 mg/dL   Microalb, Ur 570.4 mg/dL    Comment: Verified by repeat analysis. [name] Kitchen Reference Range Not established    Microalb Creat Ratio 3,395 (H) <30 mcg/mg creat    Comment: . The ADA defines abnormalities in albumin excretion as follows: [name] Kitchen Category         Result (mcg/mg creatinine) . Normal                    <30 Microalbuminuria         30-299  Clinical albuminuria   > OR = 300 . The ADA recommends that at least two of three specimens collected within a 3-6 month period be abnormal before considering a patient to be within a diagnostic category.   Lipid panel     Status: Abnormal   Collection Time: 08/05/19  3:15 PM  Result Value Ref Range   Cholesterol 155 <200 mg/dL   HDL 41 (L) > OR = 50 mg/dL   Triglycerides 269 (H) <150 mg/dL    Comment: . If a non-fasting specimen was collected, consider repeat triglyceride testing on a fasting specimen if clinically indicated.  Yates Decamp et al. J. of Clin. Lipidol. 5170;0:174-944. [name] Kitchen    LDL Cholesterol (Calc) 79 mg/dL (calc)    Comment: Reference range: <100 . Desirable range <100 mg/dL for primary prevention;   <70 mg/dL for patients with CHD or diabetic patients  with > or = 2 CHD risk factors. [name] Kitchen LDL-C is now calculated using the Etoy Mcdonnell-Hopkins  calculation, which is a validated novel method providing  better accuracy than the Friedewald equation in the  estimation of LDL-C.  Cresenciano Genre et al. Annamaria Helling. 4656;812(75): 2061-2068  (http://education.[facility].com/faq/FAQ164)    Total CHOL/HDL Ratio 3.8 <5.0 (calc)   Non-HDL Cholesterol (Calc) 114 <130 mg/dL (calc)    Comment: For patients with diabetes plus 1 major ASCVD risk  factor, treating to a non-HDL-C goal of <100 mg/dL  (LDL-C of <70 mg/dL) is considered a therapeutic  option.   Comprehensive metabolic panel     Status:  Abnormal   Collection Time: 08/05/19  3:15 PM  Result Value Ref Range   Glucose, Bld 129 65 - 139 mg/dL    Comment: .        Non-fasting reference interval .    BUN 25 7 - 25 mg/dL   Creat 1.55 (H) 0.60 - 0.93 mg/dL    Comment: For patients >85 years of age, the reference limit for Creatinine is approximately 13% higher for people identified as African-American. .    BUN/Creatinine Ratio 16 6 - 22 (calc)   Sodium 142 135 - 146 mmol/L   Potassium 4.2 3.5 - 5.3 mmol/L   Chloride 111 (H) 98 - 110 mmol/L    Comment: Verified by repeat analysis. .    CO2 24 20 - 32 mmol/L   Calcium 10.5 (H) 8.6 - 10.4 mg/dL   Total Protein 6.6 6.1 - 8.1 g/dL   Albumin 3.5 (L) 3.6 - 5.1 g/dL   Globulin 3.1 1.9 - 3.7 g/dL (calc)   AG Ratio 1.1 1.0 - 2.5 (calc)   Total Bilirubin 0.3 0.2 - 1.2 mg/dL   Alkaline phosphatase (APISO) 61 37 - 153 U/L   AST 16 10 - 35 U/L   ALT 11 6 - 29 U/L  Hemoglobin A1c     Status: Abnormal   Collection Time: 08/05/19  3:15 PM  Result Value Ref Range   Hgb A1c MFr Bld 8.4 (H) <5.7 % of total Hgb    Comment: For someone without known diabetes, a hemoglobin A1c value of 6.5% or greater indicates that they may have  diabetes and this should be confirmed with a follow-up  test. . For someone with known diabetes, a value <7% indicates  that their diabetes is well controlled and a value  greater than or equal to 7% indicates suboptimal  control. A1c targets should be individualized based on  duration of diabetes, age, comorbid conditions, and  other considerations. . Currently, no consensus exists regarding use of hemoglobin A1c for diagnosis of diabetes for children. .    Mean Plasma Glucose 194 (calc)   eAG (mmol/L) 10.8 (calc)  Hepatic function panel     Status: Abnormal   Collection Time: 08/05/19  3:15 PM  Result Value Ref Range   Total Protein 6.6 6.1 - 8.1 g/dL   Albumin 3.5 (L) 3.6 - 5.1 g/dL   Globulin 3.1 1.9 - 3.7 g/dL (calc)   AG Ratio 1.1 1.0 -  2.5 (calc)   Total Bilirubin 0.3 0.2 - 1.2 mg/dL   Bilirubin, Direct 0.1 0.0 - 0.2 mg/dL   Indirect Bilirubin 0.2 0.2 - 1.2 mg/dL (calc)   Alkaline phosphatase (APISO) 61 37 - 153 U/L   AST 16 10 - 35 U/L   ALT 11 6 - 29 U/L  TSH     Status: None   Collection Time: 08/05/19  3:15 PM  Result Value Ref Range   TSH 1.18 0.40 - 4.50 mIU/L  CBC with Differential/Platelet     Status: Abnormal   Collection Time: 08/05/19  3:15 PM  Result Value  Ref Range   WBC 6.4 3.8 - 10.8 Thousand/uL   RBC 4.02 3.80 - 5.10 Million/uL   Hemoglobin 11.3 (L) 11.7 - 15.5 g/dL   HCT 35.4 35.0 - 45.0 %   MCV 88.1 80.0 - 100.0 fL   MCH 28.1 27.0 - 33.0 pg   MCHC 31.9 (L) 32.0 - 36.0 g/dL   RDW 14.4 11.0 - 15.0 %   Platelets 291 140 - 400 Thousand/uL   MPV 9.8 7.5 - 12.5 fL   Neutro Abs 3,181 1,500 - 7,800 cells/uL   Lymphs Abs 2,362 850 - 3,900 cells/uL   Absolute Monocytes 538 200 - 950 cells/uL   Eosinophils Absolute 250 15 - 500 cells/uL   Basophils Absolute 70 0 - 200 cells/uL   Neutrophils Relative % 49.7 %   Total Lymphocyte 36.9 %   Monocytes Relative 8.4 %   Eosinophils Relative 3.9 %   Basophils Relative 1.1 %  POCT Glucose (Device for Home Use)     Status: None   Collection Time: 09/17/19 11:30 AM  Result Value Ref Range   Glucose Fasting, POC     POC Glucose 97 70 - 99 mg/dl    Assessment/Plan: 1. Diabetes mellitus type 2, uncontrolled, with complications (HCC) - POCT Glucose (Device for Home Use) 2. Labile blood glucose  One episode of nighttime sweating, no recurrence. Glucose at that time at 140 which was in an acceptable range. On further discussion it does seem she had a good amount of sweets/snacks near bedtime time so could have been a peak and drop in glucose causing symptoms without true hypoglycemia. She does not check glucose often but has had several morning levels less than 70. This is concerning giving her age. She is on insulin and SU from her Endo. Would have her hold the  SU for now due to hypoglycemia and risk for more serious consequences, especially with an XR SU. Discussed importance of consistency with meals. Reviewed meal planning with her. She declines nutritionist referral for DM and CKD. Want her to check glucose TID and record. She is to call Endocrinology to discuss recent symptoms and for further management of her diabetes. She declines further labs/workup today.   This visit occurred during the SARS-CoV-2 public health emergency.  Safety protocols were in place, including screening questions prior to the visit, additional usage of staff PPE, and extensive cleaning of exam room while observing appropriate contact time as indicated for disinfecting solutions.     Leeanne Rio, PA-C

## 2019-09-18 ENCOUNTER — Telehealth: Payer: Self-pay | Admitting: Hematology

## 2019-09-18 NOTE — Progress Notes (Signed)
  HEMATOLOGY/ONCOLOGY CONSULTATION NOTE  Date of Service: 09/19/2019  Patient Care Team: Tabori, Katherine E, MD as PCP - General (Family Medicine) Camnitz, Will Martin, MD as PCP - Cardiology (Cardiology) Nida, Gebreselassie W, MD as Consulting Physician (Endocrinology) Wagoner, Matthew R, DPM as Consulting Physician (Podiatry) Young, Clinton D, MD as Consulting Physician (Pulmonary Disease) Glenn, Steven J, MD as Consulting Physician (Ophthalmology) Potts, Jacob, RPH as Pharmacist (Pharmacist)  REFERRING PHYSICIAN: Tabori, Katherine E, MD  CHIEF COMPLAINTS/PURPOSE OF CONSULTATION:  Elevated free light chain ratio, high calcium, proteinuria, abnormal SPEP and renal dysfunction    HISTORY OF PRESENTING ILLNESS:   [name] is a wonderful 68 y.o. female who has been referred to us by Tabori, Katherine E, MD for evaluation and management of elevated free light chain ratio, high calcium, proteinuria, abnormal SPEP and renal dysfunction. The pt reports that she is doing well overall.   The pt reports she is good. Pt was sent here from her nephrologist. Her nephrologist is planning to set the pt up for kidney biopsy. She is currently on 3 types of blood pressure medication including hydrochlorothiazide. Pt has been feeling more tired recently. Pt has not been able to cook, clean, or walking due to back pain and she gets tired quickly. She has had 3 back surguries and they are anticipating having to do another surgery. She has not been told she has osteoporosis.  Pt has had breast cancer in 2003 and was treated for it with chemotherapy. She has been getting mammograms to continue monitoring it.   Pt reports she has a stomach ache all the time. She feels very bloated after breakfast and does not want to eat after. Pt has been having trouble swallowing and has to eat smaller bites of food. Her diabetes has not been well controlled but she has been trying to control it better  recently. She takes Novolin 70/30 for her diabetes 2-3 times a day. Pt found a nodule in thyroid and it was not removed and she hurts in her neck consistently. She is taking medication for thyroid replacement.   She has a spot on her back and it started out as a blister. She has had it on her back for at least 2 years and had not been changing.   Most recent lab results (09/13/19) of CBC is as follows: all values are WNL except for BUN at 34, Creatinine at 1.50, GFR Non Af Am at 35, GFR Af Am at 40, Potassium at 5.4, Calcium at 11.0, Magnesium at 2.4, RDW at 16.5, Beta Globulin at 1.4 09/13/19 of Protein/Creat Ratio at 5030 09/13/19 of Antinuclear Antibodies, IFA at Positive 09/13/19 of Kappa/ Lambda Light Chains is as follows: all values are WNL except for Free Kappa Light Chains at 55.6, Kappa/Lambda Ratio at 2.57  On review of systems, pt reports fatigue, stomach ache  and denies abdominal tenderness  and any other symptoms.   On PMHx the pt reports 3 back surgeries, neuropathy in feet and finger since 2003, diabetes, breast cancer in 2003,  lumpectomy in 2003, mastectomy in 2005, diabetes   On Family Hx the pt reports mother- breast cancer   MEDICAL HISTORY:  Past Medical History:  Diagnosis Date  . Anginal pain (HCC) 03/19/2012   saw Dr. Nasher .. she thinks its more related to stomach issues  . Arthritis    "all over; mostly in my back" (03/20/2012)  . Breast cancer (HCC)    "right" (03/20/2012)  . Cardiomyopathy   .   Chest pain 06/2005   Hospitalized, dystolic dysfunction  . Chronic lower back pain    "have 2 herniated discs; going to have to have a fusion" (03/20/2012)  . CKD (chronic kidney disease), stage III   . Family history of anesthesia complication    "father has nausea and vomiting"  . Gallstones   . GERD (gastroesophageal reflux disease)   . H/O hiatal hernia   . History of kidney stones   . History of right mastectomy   . Hypertension    "patient states never  had HTN, takes Hyzaar for heart  . Hypothyroidism   . Irritable bowel syndrome   . Migraines    "it's been a long time since I've had one" (03/20/2012)  . Osteoarthritis   . Peripheral neuropathy   . PONV (postoperative nausea and vomiting)   . Sciatica   . Type II diabetes mellitus ([place])      SURGICAL HISTORY: Past Surgical History:  Procedure Laterality Date  . ABDOMINAL HYSTERECTOMY  1993  . adenosin cardiolite  07/2005   (+) wall motion abnormality  . AXILLARY LYMPH NODE DISSECTION  2003   squamous cell cancer  . BACK SURGERY    . CARDIAC CATHETERIZATION  ? 2005 / 2007  . CHOLECYSTECTOMY N/A 01/24/2018   Procedure: LAPAROSCOPIC CHOLECYSTECTOMY;  Surgeon: Erroll Luna, MD;  Location: [facility];  Service: General;  Laterality: N/A;  . CT abdomen and pelvis  02/2010   Same  . ESOPHAGOGASTRODUODENOSCOPY  2005   GERD  . KNEE ARTHROSCOPY  1990's   "right" (03/20/2012)  . LUMBAR FUSION N/A 08/22/2014   Procedure: Right L2-3 and right L1-2 transforaminal lumbar interbody fusion with pedicle screws, rods, sleeves, cages, local bone graft, Vivigen, cancellous chips;  Surgeon: Jessy Oto, MD;  Location: [facility];  Service: Orthopedics;  Laterality: N/A;  . LUMBAR LAMINECTOMY/DECOMPRESSION MICRODISCECTOMY N/A 02/04/2014   Procedure: RIGHT L2-3 MICRODISCECTOMY ;  Surgeon: Jessy Oto, MD;  Location: [facility];  Service: Orthopedics;  Laterality: N/A;  . MASTECTOMY Right   . MASTECTOMY WITH AXILLARY LYMPH NODE DISSECTION  12/2003   "right" (03/20/2012)  . POSTERIOR CERVICAL FUSION/FORAMINOTOMY  2001  . SHOULDER ARTHROSCOPY W/ ROTATOR CUFF REPAIR  2003   Left  . THYROIDECTOMY  1977   Right  . TOTAL KNEE ARTHROPLASTY  2000   Right  . [place] of abdomen  07/2005   Fatty liver / kidney stones     SOCIAL HISTORY: Social History   Socioeconomic History  . Marital status: Married    Spouse name: Not on file  . Number of children: 1  . Years of education: Not on file  . Highest  education level: Not on file  Occupational History  . Occupation: Retired since 2003  Tobacco Use  . Smoking status: Never Smoker  . Smokeless tobacco: Never Used  Vaping Use  . Vaping Use: Never used  Substance and Sexual Activity  . Alcohol use: No  . Drug use: No  . Sexual activity: Not on file  Other Topics Concern  . Not on file  Social History Narrative  . Not on file   Social Determinants of Health   Financial Resource Strain:   . Difficulty of Paying Living Expenses:   Food Insecurity: No Food Insecurity  . Worried About Charity fundraiser in the Last Year: Never true  . Ran Out of Food in the Last Year: Never true  Transportation Needs: No Transportation Needs  . Lack of Transportation (Medical):  No  . Lack of Transportation (Non-Medical): No  Physical Activity:   . Days of Exercise per Week:   . Minutes of Exercise per Session:   Stress:   . Feeling of Stress :   Social Connections:   . Frequency of Communication with Friends and Family:   . Frequency of Social Gatherings with Friends and Family:   . Attends Religious Services:   . Active Member of Clubs or Organizations:   . Attends Club or Organization Meetings:   . Marital Status:   Intimate Partner Violence:   . Fear of Current or Ex-Partner:   . Emotionally Abused:   . Physically Abused:   . Sexually Abused:      FAMILY HISTORY: Family History  Problem Relation Age of Onset  . Hypertension Mother   . Diabetes Father   . Hypertension Father   . Hypertension Sister   . Hypertension Brother   . Hypertension Brother   . Diabetes Brother   . Diabetes Brother   . Diabetes Other        Family Hx. of Diabetes  . Hypertension Other        Family History of HTN  . Deep vein thrombosis Daughter   . Breast cancer Maternal Aunt   . Colon cancer Neg Hx   . Esophageal cancer Neg Hx   . Liver cancer Neg Hx   . Rectal cancer Neg Hx   . Stomach cancer Neg Hx      ALLERGIES:   is allergic to  dilaudid [hydromorphone hcl], lipitor [atorvastatin], adhesive [tape], ampicillin, latex, and penicillins.   MEDICATIONS:  Current Outpatient Medications  Medication Sig Dispense Refill  . acetaminophen (TYLENOL) 500 MG tablet Take 1,000 mg by mouth every 6 (six) hours as needed for moderate pain or headache.    . albuterol (VENTOLIN HFA) 108 (90 Base) MCG/ACT inhaler Inhale 2 puffs into the lungs every 6 (six) hours as needed for wheezing or shortness of breath. 8 g 0  . amLODipine (NORVASC) 10 MG tablet TAKE 1 TABLET BY MOUTH EVERY DAY 90 tablet 3  . aspirin 81 MG tablet Take 81 mg by mouth daily.    . Blood Glucose Calibration (TRUE METRIX LEVEL 2) Normal SOLN Use as directed 3 each 3  . Blood Glucose Monitoring Suppl (TRUE METRIX AIR GLUCOSE METER) w/Device KIT 1 kit by Does not apply route 3 (three) times daily. 1 kit 0  . carvedilol (COREG) 6.25 MG tablet Take 1 tablet (6.25 mg total) by mouth 2 (two) times daily with a meal. 180 tablet 1  . clotrimazole-betamethasone (LOTRISONE) cream Apply 1 application topically 2 (two) times daily. 90 g 1  . Continuous Blood Gluc Sensor (FREESTYLE LIBRE 14 DAY SENSOR) MISC Inject 1 each into the skin every 14 (fourteen) days. Use as directed. 2 each 2  . diclofenac Sodium (VOLTAREN) 1 % GEL Apply 4 g topically 4 (four) times daily as needed. 100 g 0  . ferrous gluconate (FERGON) 324 MG tablet Take 1 tablet (324 mg total) by mouth 2 (two) times daily with a meal. 60 tablet 3  . fluticasone (FLONASE) 50 MCG/ACT nasal spray Place 2 sprays into both nostrils daily. 48 g 3  . furosemide (LASIX) 40 MG tablet TAKE 1 TABLET (40 MG TOTAL) BY MOUTH DAILY AS NEEDED (LEG SWELLING). 90 tablet 1  . glipiZIDE (GLUCOTROL XL) 5 MG 24 hr tablet Take 1 tablet (5 mg total) by mouth daily with breakfast. 30 tablet 3  .   glucose blood (TRUE METRIX BLOOD GLUCOSE TEST) test strip Use as instructed 100 each 12  . insulin NPH-regular Human (70-30) 100 UNIT/ML injection Inject 60  Units into the skin See admin instructions. 60 units with breakfast and 60 units with supper for BG above 90    . levothyroxine (SYNTHROID) 100 MCG tablet Take 1 tablet (100 mcg total) by mouth daily. 90 tablet 1  . losartan (COZAAR) 50 MG tablet Take 50 mg by mouth daily.    . metoCLOPramide (REGLAN) 10 MG tablet Take 1 tablet (10 mg total) by mouth 4 (four) times daily. 120 tablet 1  . pantoprazole (PROTONIX) 40 MG tablet Take 1 tablet (40 mg total) by mouth 2 (two) times daily. 180 tablet 1  . polyethylene glycol (MIRALAX / GLYCOLAX) packet Take 17 g by mouth daily as needed for moderate constipation.    . pregabalin (LYRICA) 75 MG capsule TAKE 1 CAPSULE BY MOUTH TWICE A DAY 60 capsule 3  . rosuvastatin (CRESTOR) 20 MG tablet Take 1 tablet (20 mg total) by mouth daily at 6 PM. 90 tablet 1  . TRUEplus Lancets 33G MISC Use as directed 300 each 3  . Alcohol Swabs (B-D SINGLE USE SWABS REGULAR) PADS Use as directed. (Patient not taking: Reported on 09/17/2019) 100 each 1   Current Facility-Administered Medications  Medication Dose Route Frequency Provider Last Rate Last Admin  . 0.9 %  sodium chloride infusion  500 mL Intravenous Once Perry, John N, MD         REVIEW OF SYSTEMS:   A 10+ POINT REVIEW OF SYSTEMS WAS OBTAINED including neurology, dermatology, psychiatry, cardiac, respiratory, lymph, extremities, GI, GU, Musculoskeletal, constitutional, breasts, reproductive, HEENT.  All pertinent positives are noted in the HPI.  All others are negative.   PHYSICAL EXAMINATION: ECOG PERFORMANCE STATUS: 2 - Symptomatic, <50% confined to bed  Vitals:   09/19/19 1328  BP: (!) 142/54  Pulse: 64  Resp: 18  Temp: (!) 97.5 F (36.4 C)  SpO2: 96%   Filed Weights   09/19/19 1328  Weight: 208 lb 6.4 oz (94.5 kg)   Body mass index is 35.77 kg/m.  GENERAL:alert, in no acute distress and comfortable SKIN: no acute rashes, no significant lesions EYES: conjunctiva are pink and non-injected, sclera  anicteric OROPHARYNX: MMM, no exudates, no oropharyngeal erythema or ulceration NECK: supple, no JVD LYMPH:  no palpable lymphadenopathy in the cervical, axillary or inguinal regions LUNGS: clear to auscultation b/l with normal respiratory effort HEART: regular rate & rhythm ABDOMEN:  normoactive bowel sounds , non tender, not distended. Extremity: 2+ pedal edema PSYCH: alert & oriented x 3 with fluent speech NEURO: no focal motor/sensory deficits  LABORATORY DATA:  I have reviewed the data as listed  CBC Latest Ref Rng & Units 09/19/2019 08/05/2019 07/17/2019  WBC 4.0 - 10.5 K/uL 7.2 6.4 8.7  Hemoglobin 12.0 - 15.0 g/dL 12.9 11.3(L) 12.7  Hematocrit 36 - 46 % 40.7 35.4 38.4  Platelets 150 - 400 K/uL 251 291 310.0    CMP Latest Ref Rng & Units 09/19/2019 09/19/2019 08/05/2019  Glucose 70 - 99 mg/dL 144(H) - -  BUN 8 - 23 mg/dL 36(H) - -  Creatinine 0.44 - 1.00 mg/dL 1.64(H) - -  Sodium 135 - 145 mmol/L 137 - -  Potassium 3.5 - 5.1 mmol/L 4.9 - -  Chloride 98 - 111 mmol/L 110 - -  CO2 22 - 32 mmol/L 20(L) - -  Calcium 8.7 - 10.3 mg/dL 10.6(H) 10.5(H) -    Total Protein 6.5 - 8.1 g/dL 7.3 - 6.6  Total Bilirubin 0.3 - 1.2 mg/dL 0.3 - 0.3  Alkaline Phos 38 - 126 U/L 65 - -  AST 15 - 41 U/L 25 - 16  ALT 0 - 44 U/L 21 - 11     RADIOGRAPHIC STUDIES: I have personally reviewed the radiological images as listed and agreed with the findings in the report. Epidural Steroid injection  Result Date: 08/22/2019 Newton, Frederic, MD     08/23/2019  6:10 AM Lumbar Epidural Steroid Injection - Interlaminar Approach with Fluoroscopic Guidance [name]     Date of Birth: 03/07/1951 MRN: [id number] PCP: Tabori, Katherine E, MD     Visit Date: 08/22/2019  Universal Protocol:   Consent Given By: the patient Position: PRONE Additional Comments: Vital signs were monitored before and after the procedure. Patient was prepped and draped in the usual sterile fashion. The correct patient,  procedure, and site was verified. Injection Procedure Details: Procedure Site One Meds Administered: Meds ordered this encounter Medications . methylPREDNISolone acetate (DEPO-MEDROL) injection 40 mg  Laterality: Right Location/Site: L1-2 level is at the level above the fusion and counted down from the last rib.  Patient does have somewhat of a transitional lower segment. L1-L2 Needle size: 20 G Needle type: Tuohy Needle Placement: Paramedian epidural Findings:  -Comments: Excellent flow of contrast into the epidural space. Procedure Details: Using a paramedian approach from the side mentioned above, the region overlying the inferior lamina was localized under fluoroscopic visualization and the soft tissues overlying this structure were infiltrated with 4 ml. of 1% Lidocaine without Epinephrine. The Tuohy needle was inserted into the epidural space using a paramedian approach. The epidural space was localized using loss of resistance along with lateral and bi-planar fluoroscopic views.  After negative aspirate for air, blood, and CSF, a 2 ml. volume of Isovue-250 was injected into the epidural space and the flow of contrast was observed. Radiographs were obtained for documentation purposes.  The injectate was administered into the level noted above. Additional Comments: The patient tolerated the procedure well Dressing: 2 x 2 sterile gauze and Band-Aid  Post-procedure details: Patient was observed during the procedure. Post-procedure instructions were reviewed. Patient left the clinic in stable condition.  US RENAL  Result Date: 09/14/2019 CLINICAL DATA:  Initial evaluation for chronic kidney disease, stage G3b/A1. EXAM: RENAL / URINARY TRACT ULTRASOUND COMPLETE COMPARISON:  Prior CT from 12/25/2017. FINDINGS: Right Kidney: Renal measurements: 11.7 x 6.3 x 6.0 cm = volume: 234.8 mL. Increased echogenicity within the renal parenchyma. No nephrolithiasis or hydronephrosis. No focal renal mass. Left Kidney: Renal  measurements: 10.6 x 6.1 x 5.3 cm = volume: 178.1 mL. Diffusely increased echogenicity within the renal parenchyma. No nephrolithiasis or hydronephrosis. 1.2 x 1.1 x 1.0 cm complex cyst with internal calcification present at the mid-upper pole, stable from prior CT, consistent with a benign finding. Bladder: Appears normal for degree of bladder distention. Other: None. IMPRESSION: 1. Increased echogenicity within the renal parenchyma, consistent with medical renal disease. 2. No hydronephrosis. 3. 1.2 cm complex cyst with internal calcification at the interpolar left kidney, stable from previous. Electronically Signed   By: Benjamin  McClintock M.D.   On: 09/14/2019 08:46   US Guided Needle Placement  Result Date: 09/05/2019  Ultrasound-guided right SI injection: After sterile prep with Betadine, injected 5 cc 1% lidocaine without epinephrine and 40 mg methylprednisolone using a 22-gauge spinal needle, passing the needle through the sacroiliac ligament into the region   of the SI joint.  She had a feeling of fullness in her ears after the procedure.  I looked in her ear canal and she had excessive wax buildup, unable to visualize the tympanic membrane.  Regarding her SI pain, she had modest improvement during the immediate anesthetic phase.  XR C-ARM NO REPORT  Result Date: 08/22/2019 Please see Notes tab for imaging impression.    ASSESSMENT & PLAN:  Jeslyn Richard Doublin is a 68 y.o. female with:  1. Monoclonal Paraproteinemia - Elevated kappa light chains. R/o plasma cell dyscrasia. 2. hypercalcemia 3. CKD with possibly nephrotic range proteinuria  PLAN: -Discussed patient's most recent labs from 09/13/19, of CBC is as follows: all values are WNL except for BUN at 34, Creatinine at 1.50, GFR Non Af Am at 35, GFR Af Am at 40, Potassium at 5.4, Calcium at 11.0, Magnesium at 2.4, RDW at 16.5, Beta Globulin at 1.4 -Discussed 09/13/19 of Protein/Creat Ratio at 5030 -Discussed 09/13/19 of  Antinuclear Antibodies, IFA at Positive -Discussed 09/13/19 of Kappa/ Lambda Light Chains is as follows: all values are WNL except for Free Kappa Light Chains at 55.6, Kappa/Lambda Ratio at 2.57 -Advised on Keratosis, advised on increased pigmentation due to scaring  -Advised on multiple myeloma vs AL Amyloidosis -Advised on CRAB criteria -High Calcium, Renal failure, Anemia, Bone Tumors -Advised on plasma cell disorder and associated w/u to rule these out. -Advised on bone marrow biopsy -rule out bone marrow disorder -Advised on abnormal proteins in the blood  -Advised of protein in urine -advised on leaky kidney -Advised hydrochlorothiazide can cause increased calcium levels  -Advised on Diabetic gastroparesis -recommends smaller more frequent meals  -Recommends continue to f/u on PCP and nephrologist  -Recommends getting kidney biopsy with nephrologist  -Labs today -Whole body bone survey in 1 week -CT bone aspiration and biopsy in 1 week  -Will see back in 3 weeks  FOLLOW UP Labs today Whole body bone survey in 1 week CT bone aspiration and biopsy in 1 week RTC with Dr Kale in 3 weeks  . Orders Placed This Encounter  Procedures  . DG Bone Survey Met    Standing Status:   Future    Standing Expiration Date:   09/18/2020    Order Specific Question:   Reason for Exam (SYMPTOM  OR DIAGNOSIS REQUIRED)    Answer:   staging monoclonal paraproteinemia    Order Specific Question:   Preferred imaging location?    Answer:   [facility]  . CBC with Differential/Platelet    Standing Status:   Future    Number of Occurrences:   1    Standing Expiration Date:   09/18/2020  . CMP (Cancer Center only)    Standing Status:   Future    Number of Occurrences:   1    Standing Expiration Date:   09/18/2020  . Multiple Myeloma Panel (SPEP&IFE w/QIG)    Standing Status:   Future    Number of Occurrences:   1    Standing Expiration Date:   09/18/2020  . Kappa/lambda light chains     Standing Status:   Future    Number of Occurrences:   1    Standing Expiration Date:   09/18/2020  . 24-Hr Ur UPEP/UIFE/Light Chains/TP    Standing Status:   Future    Number of Occurrences:   1    Standing Expiration Date:   09/18/2020  . Sedimentation rate    Standing Status:   Future      Number of Occurrences:   1    Standing Expiration Date:   09/18/2020  . Lactate dehydrogenase    Standing Status:   Future    Number of Occurrences:   1    Standing Expiration Date:   09/18/2020  . Beta 2 microglobulin    Standing Status:   Future    Number of Occurrences:   1    Standing Expiration Date:   09/18/2020  . PTH, intact and calcium    Standing Status:   Future    Number of Occurrences:   1    Standing Expiration Date:   09/18/2020    The total time spent in the appt was 60 minutes and more than 50% was on counseling and direct patient cares.  All of the patient's questions were answered with apparent satisfaction. The patient knows to call the clinic with any problems, questions or concerns.   Gautam Kale MD MS AAHIVMS SCH CTH Hematology/Oncology Physician [facility]  (Office):       [phone] (Work cell):  [phone] (Fax):           [phone]  09/19/2019 2:12 PM  I, Elaine Hinkel am acting as a scribe for Dr. Gautam Kale.   .I have reviewed the above documentation for accuracy and completeness, and I agree with the above. .Gautam Kishore Kale MD      

## 2019-09-18 NOTE — Telephone Encounter (Signed)
Received a new hem referral from Dr. Royce Macadamia at [facility] for elevated free light chain ratio, high calcium, proteinuria, abnormal SPEP, and renal dysfunction. [name] has been cld and scheduled to see Dr. Irene Limbo on 6/10 at 1pm. Pt aware to arrive 15 minutes early.

## 2019-09-19 ENCOUNTER — Other Ambulatory Visit: Payer: Self-pay

## 2019-09-19 ENCOUNTER — Inpatient Hospital Stay: Payer: Medicare HMO

## 2019-09-19 ENCOUNTER — Inpatient Hospital Stay: Payer: Medicare HMO | Attending: Hematology | Admitting: Hematology

## 2019-09-19 VITALS — BP 142/54 | HR 64 | Temp 97.5°F | Resp 18 | Ht 64.0 in | Wt 208.4 lb

## 2019-09-19 DIAGNOSIS — Z79899 Other long term (current) drug therapy: Secondary | ICD-10-CM

## 2019-09-19 DIAGNOSIS — M810 Age-related osteoporosis without current pathological fracture: Secondary | ICD-10-CM

## 2019-09-19 DIAGNOSIS — Z9010 Acquired absence of unspecified breast and nipple: Secondary | ICD-10-CM

## 2019-09-19 DIAGNOSIS — D472 Monoclonal gammopathy: Secondary | ICD-10-CM

## 2019-09-19 DIAGNOSIS — Z853 Personal history of malignant neoplasm of breast: Secondary | ICD-10-CM

## 2019-09-19 DIAGNOSIS — E041 Nontoxic single thyroid nodule: Secondary | ICD-10-CM | POA: Diagnosis not present

## 2019-09-19 DIAGNOSIS — G629 Polyneuropathy, unspecified: Secondary | ICD-10-CM

## 2019-09-19 DIAGNOSIS — Z803 Family history of malignant neoplasm of breast: Secondary | ICD-10-CM

## 2019-09-19 DIAGNOSIS — L989 Disorder of the skin and subcutaneous tissue, unspecified: Secondary | ICD-10-CM

## 2019-09-19 DIAGNOSIS — I10 Essential (primary) hypertension: Secondary | ICD-10-CM | POA: Diagnosis not present

## 2019-09-19 DIAGNOSIS — R768 Other specified abnormal immunological findings in serum: Secondary | ICD-10-CM

## 2019-09-19 DIAGNOSIS — R109 Unspecified abdominal pain: Secondary | ICD-10-CM | POA: Diagnosis not present

## 2019-09-19 DIAGNOSIS — N189 Chronic kidney disease, unspecified: Secondary | ICD-10-CM | POA: Diagnosis not present

## 2019-09-19 DIAGNOSIS — R809 Proteinuria, unspecified: Secondary | ICD-10-CM

## 2019-09-19 DIAGNOSIS — Z794 Long term (current) use of insulin: Secondary | ICD-10-CM | POA: Diagnosis not present

## 2019-09-19 DIAGNOSIS — E119 Type 2 diabetes mellitus without complications: Secondary | ICD-10-CM

## 2019-09-19 DIAGNOSIS — R5383 Other fatigue: Secondary | ICD-10-CM | POA: Diagnosis not present

## 2019-09-19 LAB — CMP (CANCER CENTER ONLY)
ALT: 21 U/L (ref 0–44)
AST: 25 U/L (ref 15–41)
Albumin: 3.2 g/dL — ABNORMAL LOW (ref 3.5–5.0)
Alkaline Phosphatase: 65 U/L (ref 38–126)
Anion gap: 7 (ref 5–15)
BUN: 36 mg/dL — ABNORMAL HIGH (ref 8–23)
CO2: 20 mmol/L — ABNORMAL LOW (ref 22–32)
Calcium: 10.6 mg/dL — ABNORMAL HIGH (ref 8.9–10.3)
Chloride: 110 mmol/L (ref 98–111)
Creatinine: 1.64 mg/dL — ABNORMAL HIGH (ref 0.44–1.00)
GFR, Est AFR Am: 36 mL/min — ABNORMAL LOW (ref >60)
GFR, Estimated: 31 mL/min — ABNORMAL LOW (ref >60)
Glucose, Bld: 144 mg/dL — ABNORMAL HIGH (ref 70–99)
Potassium: 4.9 mmol/L (ref 3.5–5.1)
Sodium: 137 mmol/L (ref 135–145)
Total Bilirubin: 0.3 mg/dL (ref 0.3–1.2)
Total Protein: 7.3 g/dL (ref 6.5–8.1)

## 2019-09-19 LAB — CBC WITH DIFFERENTIAL/PLATELET
Abs Immature Granulocytes: 0.01 10*3/uL (ref 0.00–0.07)
Basophils Absolute: 0.1 10*3/uL (ref 0.0–0.1)
Basophils Relative: 1 %
Eosinophils Absolute: 0.2 10*3/uL (ref 0.0–0.5)
Eosinophils Relative: 3 %
HCT: 40.7 % (ref 36.0–46.0)
Hemoglobin: 12.9 g/dL (ref 12.0–15.0)
Immature Granulocytes: 0 %
Lymphocytes Relative: 30 %
Lymphs Abs: 2.1 10*3/uL (ref 0.7–4.0)
MCH: 29.1 pg (ref 26.0–34.0)
MCHC: 31.7 g/dL (ref 30.0–36.0)
MCV: 91.9 fL (ref 80.0–100.0)
Monocytes Absolute: 0.5 10*3/uL (ref 0.1–1.0)
Monocytes Relative: 6 %
Neutro Abs: 4.3 10*3/uL (ref 1.7–7.7)
Neutrophils Relative %: 60 %
Platelets: 251 10*3/uL (ref 150–400)
RBC: 4.43 MIL/uL (ref 3.87–5.11)
RDW: 15.5 % (ref 11.5–15.5)
WBC: 7.2 10*3/uL (ref 4.0–10.5)
nRBC: 0 % (ref 0.0–0.2)

## 2019-09-19 LAB — SEDIMENTATION RATE: Sed Rate: 39 mm/hr — ABNORMAL HIGH (ref 0–22)

## 2019-09-19 LAB — LACTATE DEHYDROGENASE: LDH: 213 U/L — ABNORMAL HIGH (ref 98–192)

## 2019-09-19 IMAGING — MR MR LUMBAR SPINE WO/W CM
5 of 8 series · 24 of 48 positions shown · IV contrast (10 ML MULTIHANCE)
Comparison: CTA chest dated March 05, 2017.

CLINICAL DATA: Low back pain radiating to the right leg for the
past 3 years. Right leg numbness and weakness. Multiple prior back
surgeries. History of breast cancer.

EXAM:
MRI THORACIC SPINE WITHOUT CONTRAST
MRI LUMBAR SPINE WITH AND WITHOUT CONTRAST
TECHNIQUE: Multiplanar and multiecho pulse sequences of the thoracic spine were
obtained without intravenous contrast. Multiplanar and multiecho
pulse sequences of the lumbar spine were obtained with and without
intravenous contrast.
CONTRAST:  10 mL MultiHance intravenous contrast.
Creatinine was obtained on site at [HOSPITAL] at [HOSPITAL].
Results: Creatinine 0.9 mg/dL.

[Series 3: T1 · sagittal · 4.0mm · 0.55mm/px · 3 of 13 slices shown (1 of 2)]
[im 1/13]
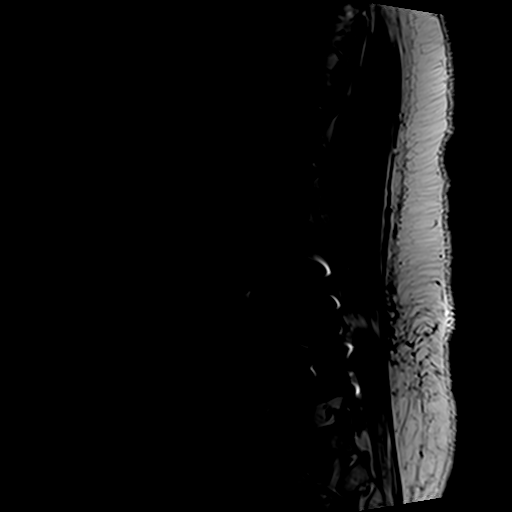
[im 7/13]
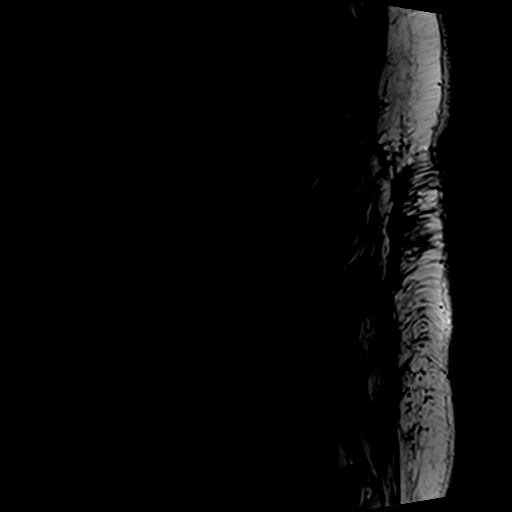
[im 13/13]
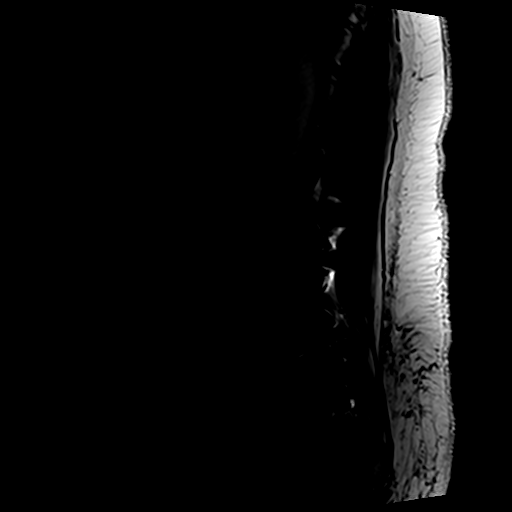

[Series 5: T2 · axial · 4.0mm · 0.70mm/px · z∈[-164,+28]mm · 8 of 35 slices shown (1 of 2)]
[im 1/35]
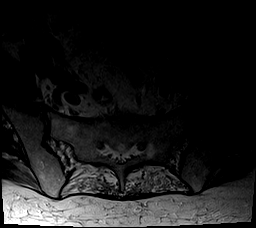
[im 4/35]
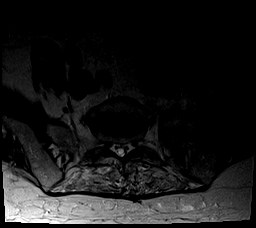
[im 12/35]
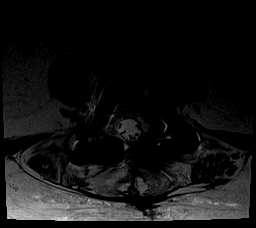
[im 16/35]
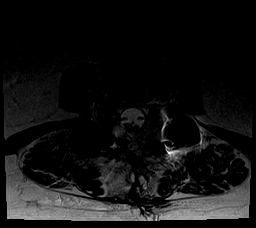
[im 19/35]
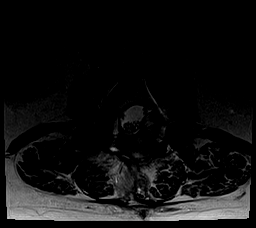
[im 23/35]
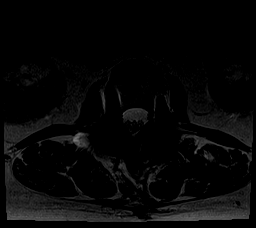
[im 31/35]
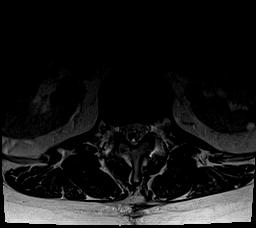
[im 35/35]
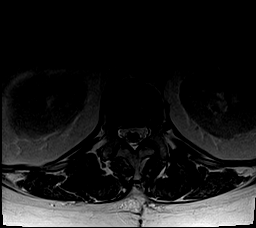

[Series 6: T1 · axial · 4.0mm · 0.35mm/px · z∈[-164,+28]mm · 8 of 35 slices shown (2 of 2)]
[im 1/35]
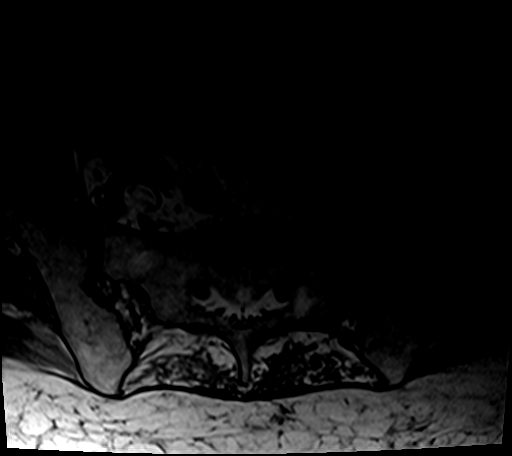
[im 4/35]
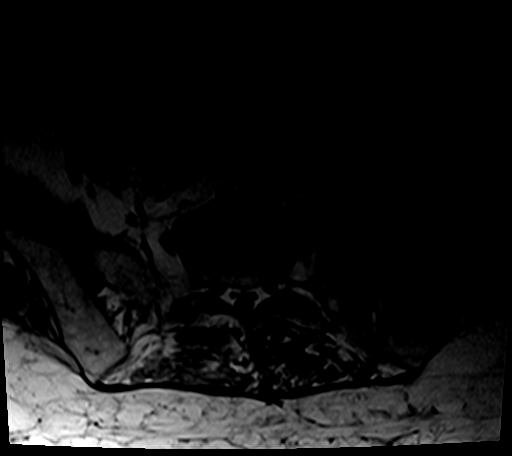
[im 12/35]
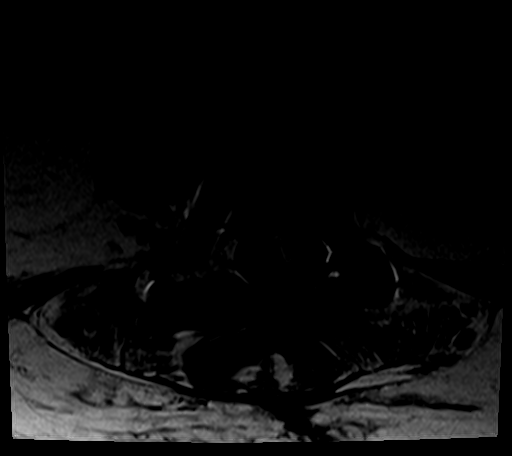
[im 16/35]
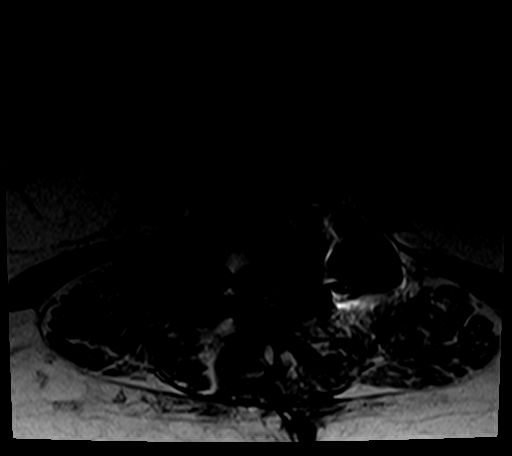
[im 19/35]
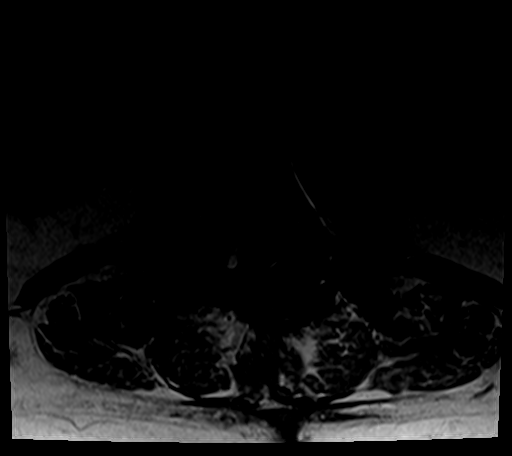
[im 23/35]
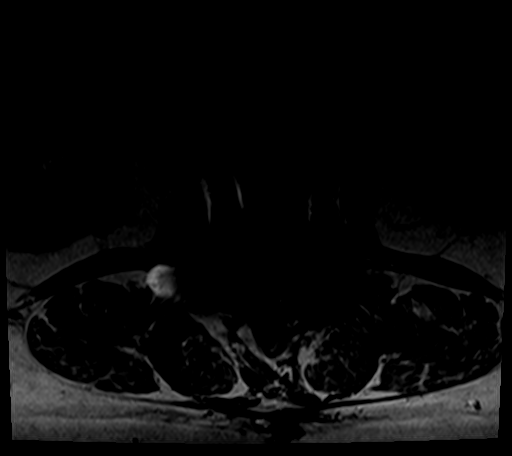
[im 31/35]
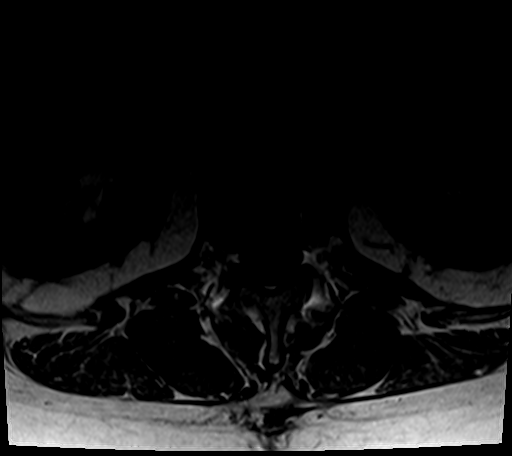
[im 35/35]
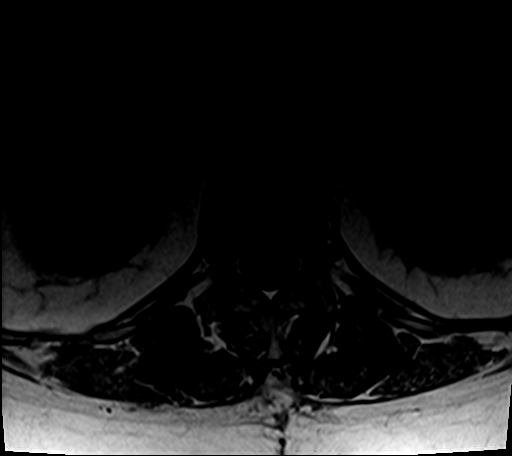

[Series 7: T2 · sagittal · 4.0mm · 0.55mm/px · 4 of 13 slices shown (2 of 2)]
[im 1/13]
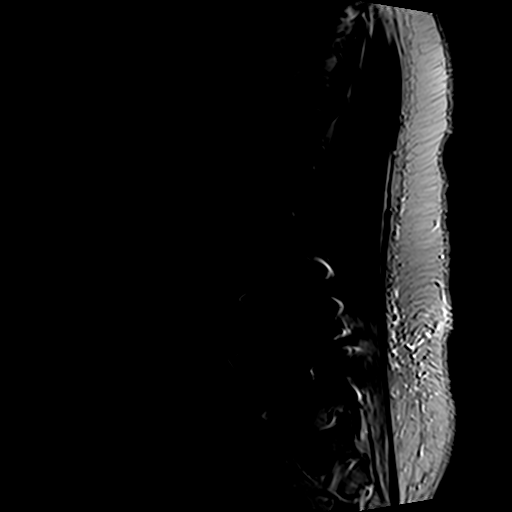
[im 5/13]
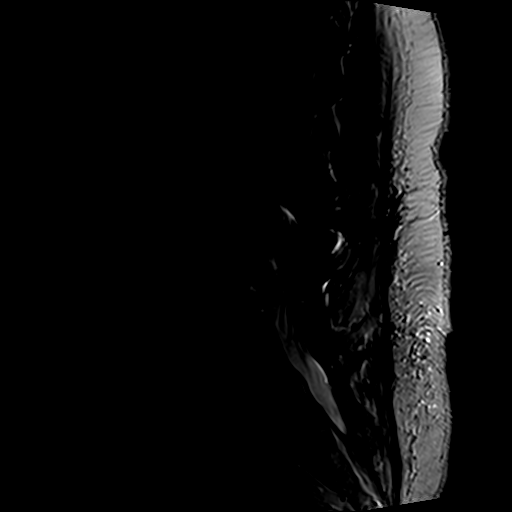
[im 9/13]
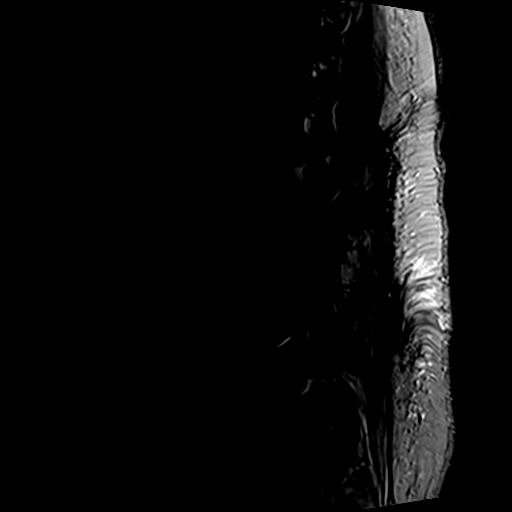
[im 13/13]
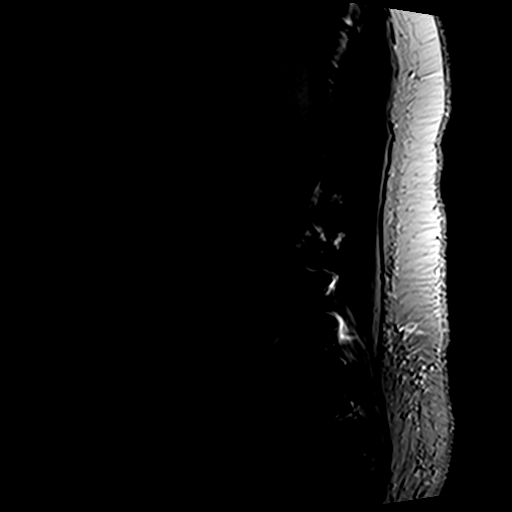

[Series 8: T1 fat-sat post-contrast · sagittal · 4.0mm · 0.55mm/px · 1 of 13 slices shown]
[im 1/13]
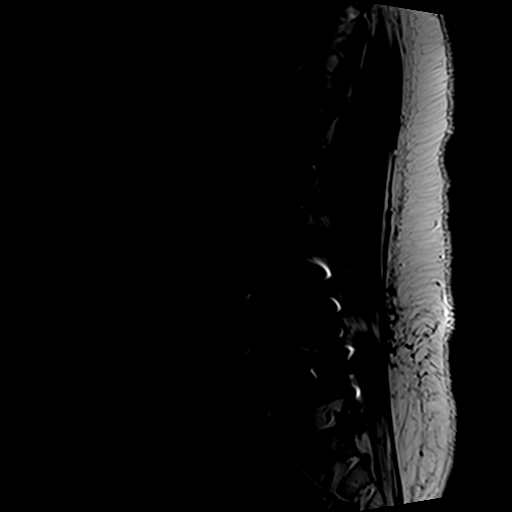

[24 of 48 positions shown; findings below may reference images not displayed]

Lumbar spine x-rays
dated October 14, 2015. CT lumbar spine dated October 21, 2014. MRI lumbar
spine dated June 02, 2014.
FINDINGS: MRI THORACIC SPINE FINDINGS

Alignment:  Physiologic.

Vertebrae: No fracture, evidence of discitis, or bone lesion. Prior
C4-C5 ACDF seen on localizer sequence.

Cord:  Normal signal and morphology.

Paraspinal and other soft tissues: Negative.

Disc levels:

T1-T2: Tiny central disc protrusion.  No stenosis.

T2-T3: Tiny central disc protrusion.  No stenosis.

T3-T4 and T4-T5: Negative.

T5-T6: Tiny central disc protrusion.  No stenosis.

T6-T7: Tiny left paracentral disc protrusion.  No stenosis.

T7-T8: Tiny central and right paracentral disc protrusion. No
stenosis.

T8-T9 to T11-T12: Negative.

MRI LUMBAR SPINE FINDINGS

Segmentation: Transitional lumbosacral anatomy with lumbarization of
S1.

Alignment: New trace retrolisthesis at L1-L2. Unchanged trace
retrolisthesis at L2-L3. Unchanged trace anterolisthesis at L4-L5
and L5-S1.

Vertebrae: Prior L2-S1 posterior fusion with interbody disc spacers.
Unchanged mild superior endplate compression deformity of L2. Both
L2 pedicle screws are again noted to protrude through the is
superior endplate of L2. The right L3 pedicle screw also protrudes
through the superior endplate. This is unchanged. No acute fracture,
evidence of discitis, or focal bone lesion.

Conus medullaris and cauda equina: Conus extends to the L1-L2 level.
Conus and cauda equina appear normal. No abnormal enhancement.

Paraspinal and other soft tissues: Negative.

Disc levels:

T12-L1: Central disc extrusion now migrating more inferiorly, up to
8 mm. No stenosis.

L1-L2: New central disc protrusion and right paracentral disc
osteophyte complex. Mild bilateral facet arthropathy with ligamentum
flavum hypertrophy. Mild central spinal canal stenosis. No
neuroforaminal stenosis.

L2-L3: Prior right laminectomy and posterior fusion. Unchanged mild
right neuroforaminal stenosis. No spinal canal or left
neuroforaminal stenosis.

L3-L4: Prior right laminectomy and posterior fusion. Unchanged mild
right neuroforaminal stenosis. No spinal canal or left
neuroforaminal stenosis.

L4-L5: Prior posterior decompression and fusion. No residual
stenosis.

L5-S1: Prior posterior decompression and fusion. No residual
stenosis.

S1-S2: Vestigial disc space with mild bilateral facet arthropathy.
No stenosis.
IMPRESSION: MR THORACIC SPINE:

1. Minimal multilevel degenerative disc disease. No spinal canal or
neuroforaminal stenosis.

MR LUMBAR SPINE:

1. Transitional lumbosacral anatomy. Increasing with prior studies,
the transitional segment is labeled S1.
2. Prior L2-S1 fusion with unchanged bilateral L2 and right L3
pedicle screws protruding through their respective superior
endplates.
3. Unchanged mild superior endplate compression deformity of L2.
4. New mild central spinal canal stenosis at L1-L2 due to central
disc protrusion and right paracentral disc osteophyte complex. No
nerve impingement.
5. Unchanged mild right neuroforaminal stenosis at L2-L3 and L3-L4.

## 2019-09-19 IMAGING — MR MR THORACIC SPINE W/O CM
4 of 5 series · 16 of 48 positions shown · IV contrast (multihance)
Comparison: CTA chest dated March 05, 2017.

CLINICAL DATA: Low back pain radiating to the right leg for the
past 3 years. Right leg numbness and weakness. Multiple prior back
surgeries. History of breast cancer.

EXAM:
MRI THORACIC SPINE WITHOUT CONTRAST
MRI LUMBAR SPINE WITH AND WITHOUT CONTRAST
TECHNIQUE: Multiplanar and multiecho pulse sequences of the thoracic spine were
obtained without intravenous contrast. Multiplanar and multiecho
pulse sequences of the lumbar spine were obtained with and without
intravenous contrast.
CONTRAST:  10 mL MultiHance intravenous contrast.
Creatinine was obtained on site at [HOSPITAL] at [HOSPITAL].
Results: Creatinine 0.9 mg/dL.

[Series 5: T2 · sagittal · 3.5mm · 0.47mm/px · 4 of 12 slices shown (1 of 2)]
[im 1/12]
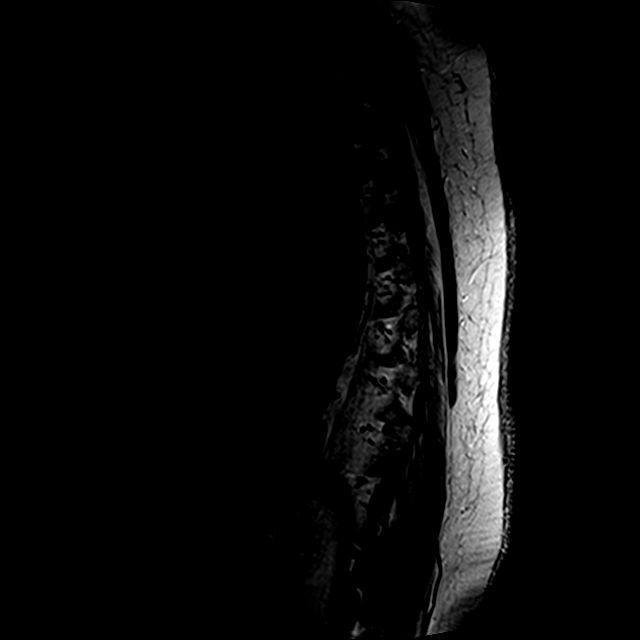
[im 4/12]
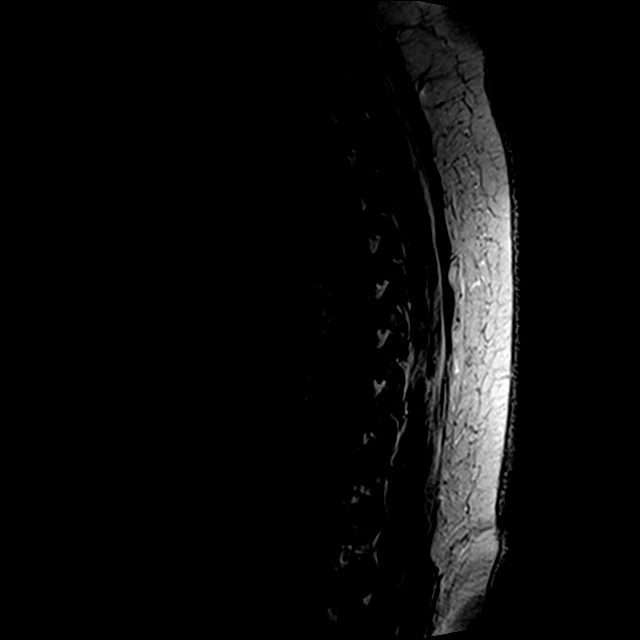
[im 8/12]
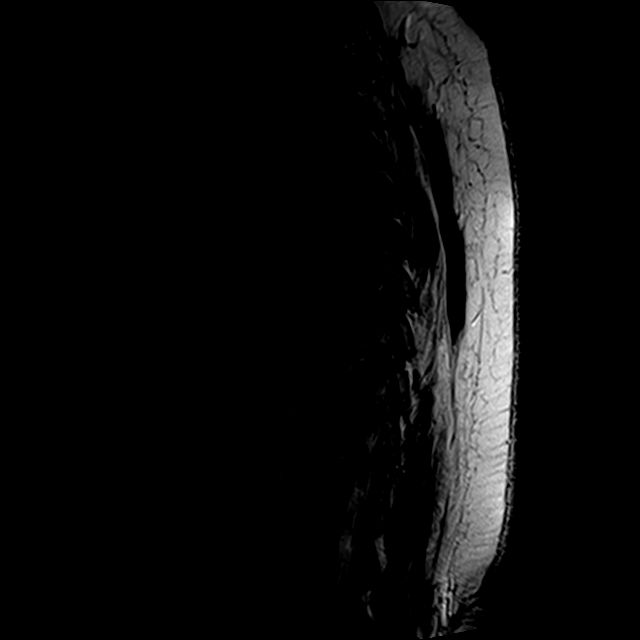
[im 12/12]
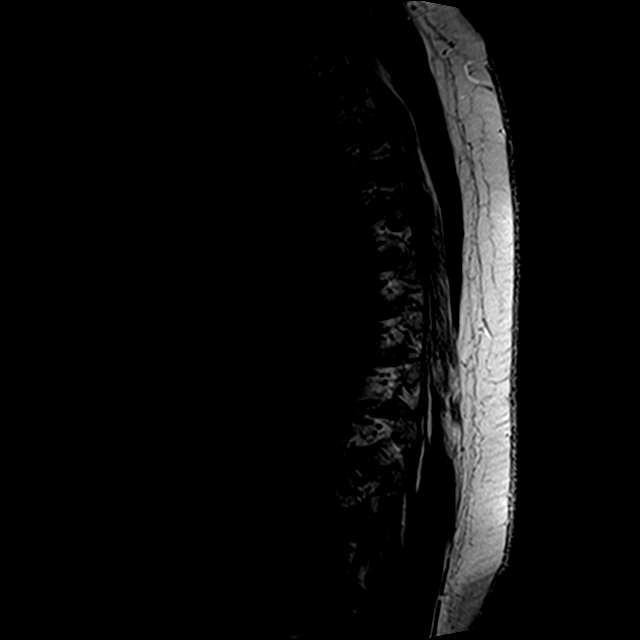

[Series 6: T1 · sagittal · 3.5mm · 0.47mm/px · 3 of 12 slices shown]
[im 1/12]
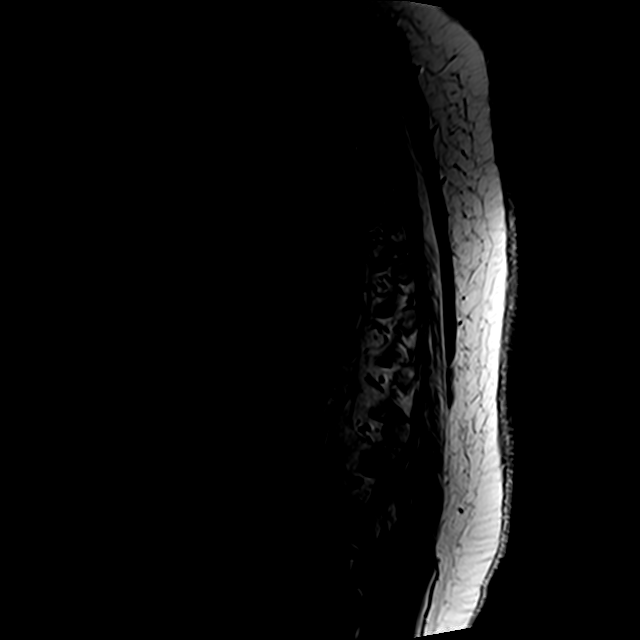
[im 6/12]
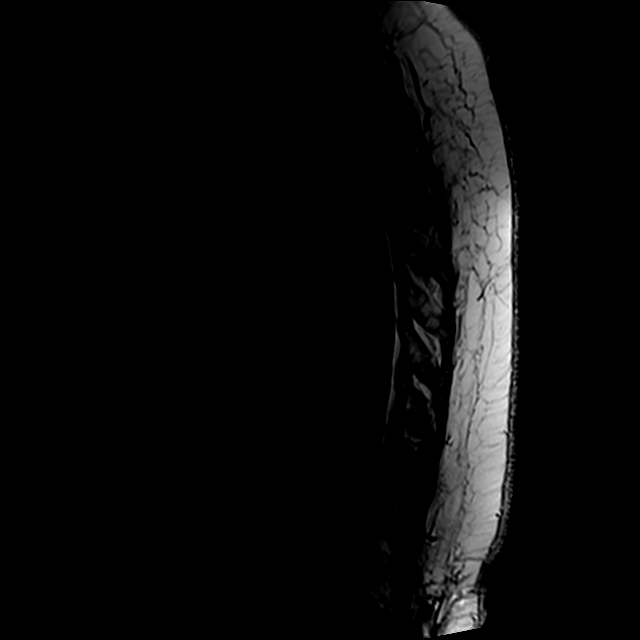
[im 12/12]
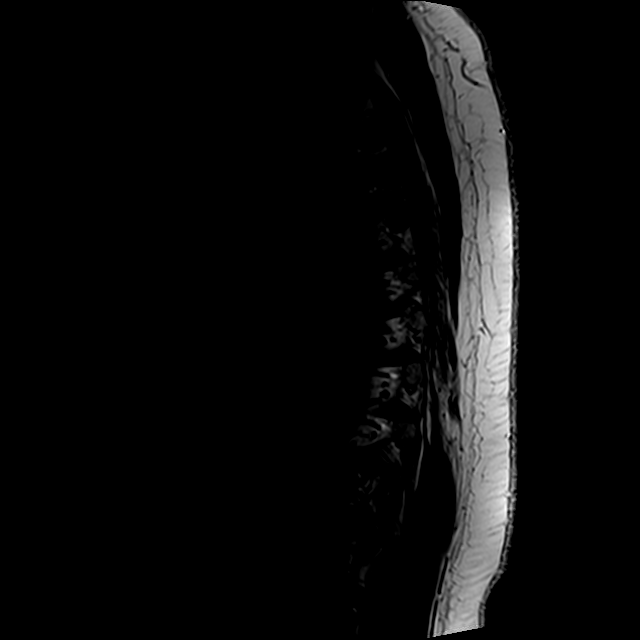

[Series 7: STIR · sagittal · 3.5mm · 0.94mm/px · 3 of 12 slices shown]
[im 1/12]
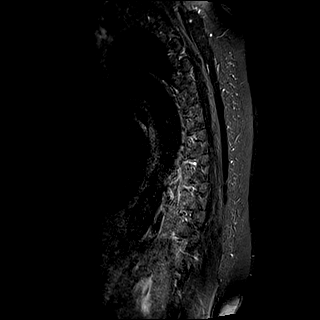
[im 6/12]
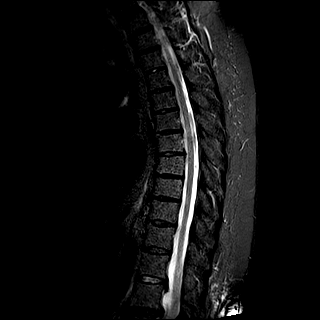
[im 12/12]
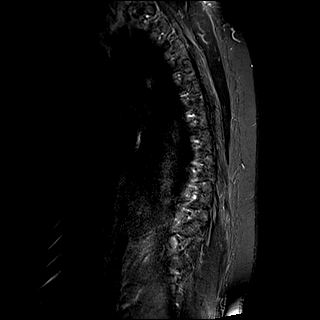

[Series 8: T2 · axial · 4.0mm · 0.39mm/px · z∈[-229,-50]mm · 6 of 40 slices shown (2 of 2)]
[im 3/40]
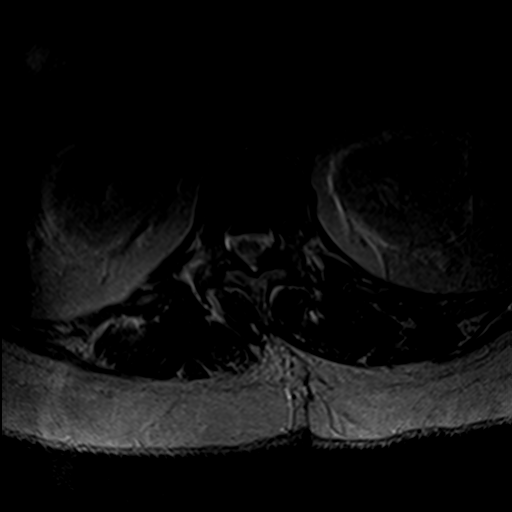
[im 5/40]
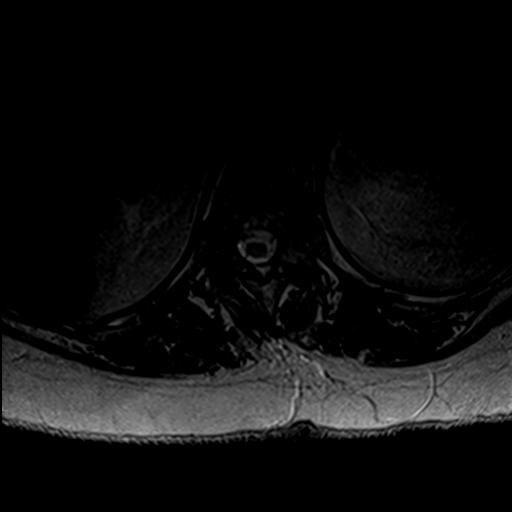
[im 8/40]
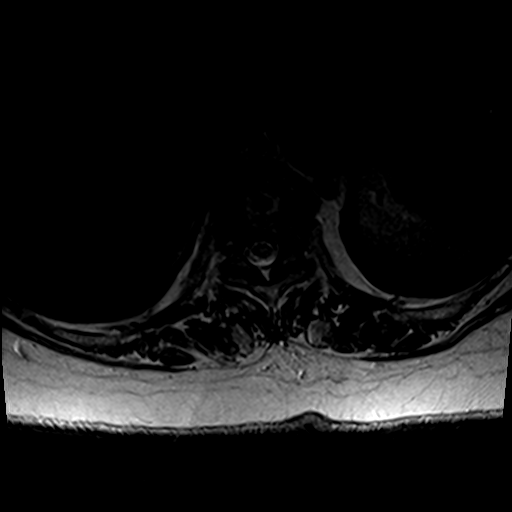
[im 13/40]
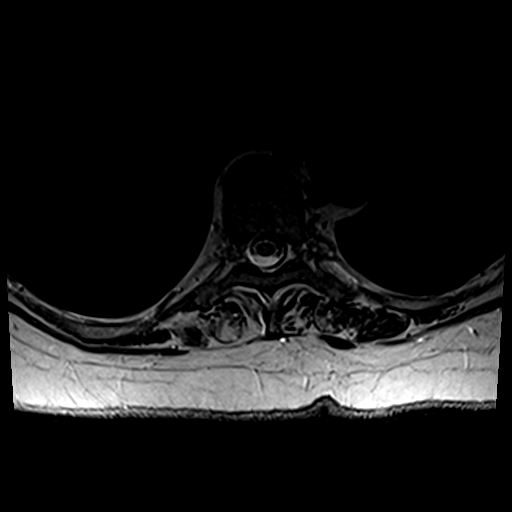
[im 20/40]
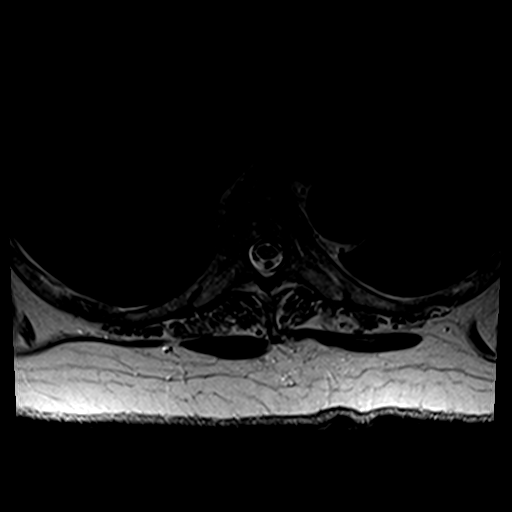
[im 35/40]
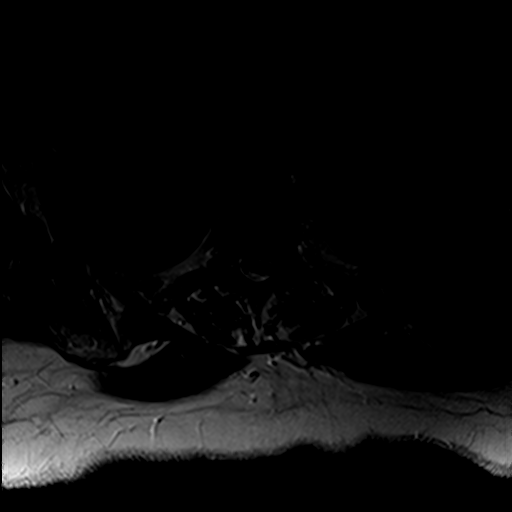

[16 of 48 positions shown; findings below may reference images not displayed]

Lumbar spine x-rays
dated October 14, 2015. CT lumbar spine dated October 21, 2014. MRI lumbar
spine dated June 02, 2014.
FINDINGS: MRI THORACIC SPINE FINDINGS

Alignment:  Physiologic.

Vertebrae: No fracture, evidence of discitis, or bone lesion. Prior
C4-C5 ACDF seen on localizer sequence.

Cord:  Normal signal and morphology.

Paraspinal and other soft tissues: Negative.

Disc levels:

T1-T2: Tiny central disc protrusion.  No stenosis.

T2-T3: Tiny central disc protrusion.  No stenosis.

T3-T4 and T4-T5: Negative.

T5-T6: Tiny central disc protrusion.  No stenosis.

T6-T7: Tiny left paracentral disc protrusion.  No stenosis.

T7-T8: Tiny central and right paracentral disc protrusion. No
stenosis.

T8-T9 to T11-T12: Negative.

MRI LUMBAR SPINE FINDINGS

Segmentation: Transitional lumbosacral anatomy with lumbarization of
S1.

Alignment: New trace retrolisthesis at L1-L2. Unchanged trace
retrolisthesis at L2-L3. Unchanged trace anterolisthesis at L4-L5
and L5-S1.

Vertebrae: Prior L2-S1 posterior fusion with interbody disc spacers.
Unchanged mild superior endplate compression deformity of L2. Both
L2 pedicle screws are again noted to protrude through the is
superior endplate of L2. The right L3 pedicle screw also protrudes
through the superior endplate. This is unchanged. No acute fracture,
evidence of discitis, or focal bone lesion.

Conus medullaris and cauda equina: Conus extends to the L1-L2 level.
Conus and cauda equina appear normal. No abnormal enhancement.

Paraspinal and other soft tissues: Negative.

Disc levels:

T12-L1: Central disc extrusion now migrating more inferiorly, up to
8 mm. No stenosis.

L1-L2: New central disc protrusion and right paracentral disc
osteophyte complex. Mild bilateral facet arthropathy with ligamentum
flavum hypertrophy. Mild central spinal canal stenosis. No
neuroforaminal stenosis.

L2-L3: Prior right laminectomy and posterior fusion. Unchanged mild
right neuroforaminal stenosis. No spinal canal or left
neuroforaminal stenosis.

L3-L4: Prior right laminectomy and posterior fusion. Unchanged mild
right neuroforaminal stenosis. No spinal canal or left
neuroforaminal stenosis.

L4-L5: Prior posterior decompression and fusion. No residual
stenosis.

L5-S1: Prior posterior decompression and fusion. No residual
stenosis.

S1-S2: Vestigial disc space with mild bilateral facet arthropathy.
No stenosis.
IMPRESSION: MR THORACIC SPINE:

1. Minimal multilevel degenerative disc disease. No spinal canal or
neuroforaminal stenosis.

MR LUMBAR SPINE:

1. Transitional lumbosacral anatomy. Increasing with prior studies,
the transitional segment is labeled S1.
2. Prior L2-S1 fusion with unchanged bilateral L2 and right L3
pedicle screws protruding through their respective superior
endplates.
3. Unchanged mild superior endplate compression deformity of L2.
4. New mild central spinal canal stenosis at L1-L2 due to central
disc protrusion and right paracentral disc osteophyte complex. No
nerve impingement.
5. Unchanged mild right neuroforaminal stenosis at L2-L3 and L3-L4.

## 2019-09-19 NOTE — Patient Instructions (Signed)
Thank you for choosing [facility] to provide your oncology and hematology care.   Should you have questions after your visit to the [facility] ([facility]), please contact this office at [phone] between 8:30 AM and 4:30 PM.  Voice mails left after 4:00 PM may not be returned until the following business day.  Calls received after 4:30 PM will be answered by an off-site Nurse Triage Line.    Prescription Refills:  Please have your pharmacy contact us directly for most prescription requests.  Contact the office directly for refills of narcotics (pain medications). Allow 48-72 hours for refills.  Appointments: Please contact the [facility] scheduling department [phone] for questions regarding [facility] appointment scheduling.  Contact the schedulers with any scheduling changes so that your appointment can be rescheduled in a timely manner.   Central Scheduling for [facility] [phone] - Call to schedule procedures such as PET scans, CT scans, MRI, Ultrasound, etc.  To afford each patient quality time with our providers, please arrive 30 minutes before your scheduled appointment time.  If you arrive late for your appointment, you may be asked to reschedule.  We strive to give you quality time with our providers, and arriving late affects you and other patients whose appointments are after yours. If you are a no show for multiple scheduled visits, you may be dismissed from the clinic at the providers discretion.     Resources: [facility] Social Workers [phone] for additional information on assistance programs or assistance connecting with community support programs   [place] DSS  336-641-3447: Information regarding food stamps, Medicaid, and utility assistance [place] [phone]   [place] [employer]'s shared-ride transportation service for eligible riders who have a disability that prevents them from riding the fixed route bus.   Medicare Rights Center  800-333-4114 Helps people with Medicare understand their rights and benefits, navigate the Medicare system, and secure the quality healthcare they deserve [employer] 800-227-2345 Assists patients locate various types of support and financial assistance Cancer Care: 1-800-813-HOPE (4673) Provides financial assistance, online support groups, medication/co-pay assistance.   Transportation Assistance for appointments at [facility]: Transportation Coordinator [phone]  Again, thank you for choosing [facility] for your care.       

## 2019-09-20 LAB — PTH, INTACT AND CALCIUM
Calcium, Total (PTH): 10.5 mg/dL — ABNORMAL HIGH (ref 8.7–10.3)
PTH: 77 pg/mL — ABNORMAL HIGH (ref 15–65)

## 2019-09-20 LAB — KAPPA/LAMBDA LIGHT CHAINS
Kappa free light chain: 56.9 mg/L — ABNORMAL HIGH (ref 3.3–19.4)
Kappa, lambda light chain ratio: 2.31 — ABNORMAL HIGH (ref 0.26–1.65)
Lambda free light chains: 24.6 mg/L (ref 5.7–26.3)

## 2019-09-20 LAB — BETA 2 MICROGLOBULIN, SERUM: Beta-2 Microglobulin: 3.7 mg/L — ABNORMAL HIGH (ref 0.6–2.4)

## 2019-09-23 ENCOUNTER — Telehealth: Payer: Self-pay | Admitting: "Endocrinology

## 2019-09-23 LAB — MULTIPLE MYELOMA PANEL, SERUM
Albumin SerPl Elph-Mcnc: 3.2 g/dL (ref 2.9–4.4)
Albumin/Glob SerPl: 0.9 (ref 0.7–1.7)
Alpha 1: 0.2 g/dL (ref 0.0–0.4)
Alpha2 Glob SerPl Elph-Mcnc: 1 g/dL (ref 0.4–1.0)
B-Globulin SerPl Elph-Mcnc: 1.4 g/dL — ABNORMAL HIGH (ref 0.7–1.3)
Gamma Glob SerPl Elph-Mcnc: 1.1 g/dL (ref 0.4–1.8)
Globulin, Total: 3.7 g/dL (ref 2.2–3.9)
IgA: 471 mg/dL — ABNORMAL HIGH (ref 87–352)
IgG (Immunoglobin G), Serum: 1202 mg/dL (ref 586–1602)
IgM (Immunoglobulin M), Srm: 112 mg/dL (ref 26–217)
Total Protein ELP: 6.9 g/dL (ref 6.0–8.5)

## 2019-09-23 NOTE — Telephone Encounter (Signed)
Advised pt to call [facility], understanding voiced.

## 2019-09-23 NOTE — Telephone Encounter (Signed)
Patient said she still has not received her freestyle libre. It was ordered on 5/24. Please advise

## 2019-09-24 ENCOUNTER — Telehealth: Payer: Self-pay | Admitting: Hematology

## 2019-09-24 ENCOUNTER — Other Ambulatory Visit: Payer: Self-pay | Admitting: "Endocrinology

## 2019-09-24 DIAGNOSIS — D472 Monoclonal gammopathy: Secondary | ICD-10-CM | POA: Diagnosis not present

## 2019-09-24 DIAGNOSIS — R5383 Other fatigue: Secondary | ICD-10-CM | POA: Diagnosis not present

## 2019-09-24 DIAGNOSIS — G629 Polyneuropathy, unspecified: Secondary | ICD-10-CM | POA: Diagnosis not present

## 2019-09-24 DIAGNOSIS — N189 Chronic kidney disease, unspecified: Secondary | ICD-10-CM | POA: Diagnosis not present

## 2019-09-24 DIAGNOSIS — M810 Age-related osteoporosis without current pathological fracture: Secondary | ICD-10-CM | POA: Diagnosis not present

## 2019-09-24 DIAGNOSIS — L989 Disorder of the skin and subcutaneous tissue, unspecified: Secondary | ICD-10-CM | POA: Diagnosis not present

## 2019-09-24 DIAGNOSIS — R109 Unspecified abdominal pain: Secondary | ICD-10-CM | POA: Diagnosis not present

## 2019-09-24 DIAGNOSIS — E041 Nontoxic single thyroid nodule: Secondary | ICD-10-CM | POA: Diagnosis not present

## 2019-09-24 NOTE — Telephone Encounter (Signed)
Scheduled per los, patient has been called and notified. 

## 2019-09-25 ENCOUNTER — Telehealth: Payer: Self-pay | Admitting: "Endocrinology

## 2019-09-25 NOTE — Telephone Encounter (Signed)
Called home # and no answer, unable to leave voicemail, called mobile # and left message for pt to return call to advise

## 2019-09-25 NOTE — Telephone Encounter (Signed)
Advise her to lower Novolin 70/30 to 40 units with breakfast and, and 40 units daily with supper  for pre-meal blood glucose readings above 90 mg/dL.

## 2019-09-25 NOTE — Telephone Encounter (Signed)
Please advise 

## 2019-09-25 NOTE — Telephone Encounter (Signed)
Patient is calling and states that she was told to call if sugar below 72 3 days in a row.  Reading before breakfast in AM  6/16 51   6/15 54  6/14 66  [phone]

## 2019-09-26 ENCOUNTER — Ambulatory Visit (HOSPITAL_COMMUNITY)
Admission: RE | Admit: 2019-09-26 | Discharge: 2019-09-26 | Disposition: A | Discharge status: Home / Self Care | Payer: Medicare HMO | Source: Ambulatory Visit | Attending: Hematology | Admitting: Hematology

## 2019-09-26 ENCOUNTER — Other Ambulatory Visit: Payer: Self-pay

## 2019-09-26 DIAGNOSIS — D472 Monoclonal gammopathy: Secondary | ICD-10-CM | POA: Diagnosis not present

## 2019-09-26 DIAGNOSIS — R768 Other specified abnormal immunological findings in serum: Secondary | ICD-10-CM | POA: Diagnosis not present

## 2019-09-26 LAB — UPEP/UIFE/LIGHT CHAINS/TP, 24-HR UR
% BETA, Urine: 8.4 %
ALPHA 1 URINE: 1.6 %
Albumin, U: 80.3 %
Alpha 2, Urine: 3.6 %
Free Kappa Lt Chains,Ur: 30.64 mg/L (ref 0.63–113.79)
Free Kappa/Lambda Ratio: 4.4 (ref 1.03–31.76)
Free Lambda Lt Chains,Ur: 6.97 mg/L (ref 0.47–11.77)
GAMMA GLOBULIN URINE: 6.1 %
Total Protein, Urine-Ur/day: 2409 mg/24 hr — ABNORMAL HIGH (ref 30–150)
Total Protein, Urine: 141.7 mg/dL
Total Volume: 1700

## 2019-09-26 NOTE — Telephone Encounter (Signed)
Left a message requesting a return call to the office. 

## 2019-09-27 ENCOUNTER — Other Ambulatory Visit: Payer: Self-pay | Admitting: Nephrology

## 2019-09-27 ENCOUNTER — Other Ambulatory Visit (HOSPITAL_COMMUNITY): Payer: Self-pay | Admitting: Nephrology

## 2019-09-27 DIAGNOSIS — E1129 Type 2 diabetes mellitus with other diabetic kidney complication: Secondary | ICD-10-CM

## 2019-09-27 NOTE — Telephone Encounter (Signed)
Discussed with pt, understanding voiced. 

## 2019-09-28 ENCOUNTER — Other Ambulatory Visit: Payer: Self-pay | Admitting: Specialist

## 2019-10-01 ENCOUNTER — Other Ambulatory Visit: Payer: Self-pay | Admitting: Radiology

## 2019-10-02 ENCOUNTER — Encounter (HOSPITAL_COMMUNITY): Payer: Self-pay

## 2019-10-02 ENCOUNTER — Other Ambulatory Visit: Payer: Self-pay

## 2019-10-02 ENCOUNTER — Ambulatory Visit (HOSPITAL_COMMUNITY)
Admission: RE | Admit: 2019-10-02 | Discharge: 2019-10-02 | Disposition: A | Discharge status: Home / Self Care | Payer: Medicare HMO | Source: Ambulatory Visit | Attending: Hematology | Admitting: Hematology

## 2019-10-02 DIAGNOSIS — N183 Chronic kidney disease, stage 3 unspecified: Secondary | ICD-10-CM | POA: Diagnosis not present

## 2019-10-02 DIAGNOSIS — D72822 Plasmacytosis: Secondary | ICD-10-CM | POA: Diagnosis not present

## 2019-10-02 DIAGNOSIS — I429 Cardiomyopathy, unspecified: Secondary | ICD-10-CM | POA: Diagnosis not present

## 2019-10-02 DIAGNOSIS — E1122 Type 2 diabetes mellitus with diabetic chronic kidney disease: Secondary | ICD-10-CM | POA: Diagnosis not present

## 2019-10-02 DIAGNOSIS — Z79899 Other long term (current) drug therapy: Secondary | ICD-10-CM | POA: Diagnosis not present

## 2019-10-02 DIAGNOSIS — I129 Hypertensive chronic kidney disease with stage 1 through stage 4 chronic kidney disease, or unspecified chronic kidney disease: Secondary | ICD-10-CM | POA: Diagnosis not present

## 2019-10-02 DIAGNOSIS — K589 Irritable bowel syndrome without diarrhea: Secondary | ICD-10-CM | POA: Diagnosis not present

## 2019-10-02 DIAGNOSIS — Z7982 Long term (current) use of aspirin: Secondary | ICD-10-CM | POA: Diagnosis not present

## 2019-10-02 DIAGNOSIS — G629 Polyneuropathy, unspecified: Secondary | ICD-10-CM | POA: Insufficient documentation

## 2019-10-02 DIAGNOSIS — G8929 Other chronic pain: Secondary | ICD-10-CM | POA: Diagnosis not present

## 2019-10-02 DIAGNOSIS — Z7901 Long term (current) use of anticoagulants: Secondary | ICD-10-CM | POA: Diagnosis not present

## 2019-10-02 DIAGNOSIS — K219 Gastro-esophageal reflux disease without esophagitis: Secondary | ICD-10-CM | POA: Diagnosis not present

## 2019-10-02 DIAGNOSIS — R768 Other specified abnormal immunological findings in serum: Secondary | ICD-10-CM

## 2019-10-02 DIAGNOSIS — E039 Hypothyroidism, unspecified: Secondary | ICD-10-CM | POA: Insufficient documentation

## 2019-10-02 DIAGNOSIS — Z8249 Family history of ischemic heart disease and other diseases of the circulatory system: Secondary | ICD-10-CM | POA: Insufficient documentation

## 2019-10-02 DIAGNOSIS — D472 Monoclonal gammopathy: Secondary | ICD-10-CM | POA: Diagnosis not present

## 2019-10-02 DIAGNOSIS — Z794 Long term (current) use of insulin: Secondary | ICD-10-CM | POA: Diagnosis not present

## 2019-10-02 DIAGNOSIS — M1990 Unspecified osteoarthritis, unspecified site: Secondary | ICD-10-CM | POA: Diagnosis not present

## 2019-10-02 DIAGNOSIS — M549 Dorsalgia, unspecified: Secondary | ICD-10-CM | POA: Diagnosis not present

## 2019-10-02 LAB — CBC WITH DIFFERENTIAL/PLATELET
Abs Immature Granulocytes: 0.02 10*3/uL (ref 0.00–0.07)
Basophils Absolute: 0.1 10*3/uL (ref 0.0–0.1)
Basophils Relative: 1 %
Eosinophils Absolute: 0.3 10*3/uL (ref 0.0–0.5)
Eosinophils Relative: 4 %
HCT: 43.2 % (ref 36.0–46.0)
Hemoglobin: 12.8 g/dL (ref 12.0–15.0)
Immature Granulocytes: 0 %
Lymphocytes Relative: 35 %
Lymphs Abs: 2.8 10*3/uL (ref 0.7–4.0)
MCH: 28.9 pg (ref 26.0–34.0)
MCHC: 29.6 g/dL — ABNORMAL LOW (ref 30.0–36.0)
MCV: 97.5 fL (ref 80.0–100.0)
Monocytes Absolute: 0.8 10*3/uL (ref 0.1–1.0)
Monocytes Relative: 10 %
Neutro Abs: 4 10*3/uL (ref 1.7–7.7)
Neutrophils Relative %: 50 %
Platelets: 254 10*3/uL (ref 150–400)
RBC: 4.43 MIL/uL (ref 3.87–5.11)
RDW: 15.4 % (ref 11.5–15.5)
WBC: 7.9 10*3/uL (ref 4.0–10.5)
nRBC: 0 % (ref 0.0–0.2)

## 2019-10-02 LAB — GLUCOSE, CAPILLARY
Glucose-Capillary: 76 mg/dL (ref 70–99)
Glucose-Capillary: 81 mg/dL (ref 70–99)
Glucose-Capillary: 168 mg/dL — ABNORMAL HIGH (ref 70–99)

## 2019-10-02 MED ORDER — DEXTROSE 50 % IV SOLN: 50.0000 mL | INTRAVENOUS | Status: DC

## 2019-10-02 MED ORDER — DEXTROSE 5 % IV SOLN: INTRAVENOUS | Status: DC

## 2019-10-02 MED ORDER — MIDAZOLAM HCL 2 MG/2ML IJ SOLN
INTRAMUSCULAR | Status: AC | PRN
  Administered 2019-10-02: 1 mg via INTRAVENOUS

## 2019-10-02 MED ORDER — MIDAZOLAM HCL 2 MG/2ML IJ SOLN
INTRAMUSCULAR | Status: AC
  Filled 2019-10-02: qty 4

## 2019-10-02 MED ORDER — SODIUM CHLORIDE 0.9 % IV SOLN: INTRAVENOUS | Status: DC

## 2019-10-02 MED ORDER — DEXTROSE 50 % IV SOLN
INTRAVENOUS | Status: AC
  Administered 2019-10-02: 50 mL
  Filled 2019-10-02: qty 50

## 2019-10-02 MED ORDER — FENTANYL CITRATE (PF) 100 MCG/2ML IJ SOLN
INTRAMUSCULAR | Status: AC | PRN
  Administered 2019-10-02 (×2): 50 ug via INTRAVENOUS

## 2019-10-02 MED ORDER — LIDOCAINE HCL (PF) 1 % IJ SOLN
INTRAMUSCULAR | Status: AC | PRN
  Administered 2019-10-02: 10 mL

## 2019-10-02 MED ORDER — FENTANYL CITRATE (PF) 100 MCG/2ML IJ SOLN
INTRAMUSCULAR | Status: AC
  Filled 2019-10-02: qty 2

## 2019-10-02 NOTE — H&P (Signed)
Chief Complaint: Patient was seen in consultation today for bone marrow biopsy.  Referring Physician(s): Brunetta Genera  Supervising Physician: Markus Daft  Patient Status: [facility] - Out-pt  History of Present Illness: [name] is a 69 y.o. female with a past medical history significant for arthritis, peripheral neuropathy, sciatica, hypothyroidism, HTN, cardiomyopathy, angina, CKD and breast cancer (2013) who presents today for a bone marrow biopsy. [name] was referred to heme/onc after lab work noted elevated free light chain ratio, high calcium, proteinuria and abnormal SPEP. She was seen by Dr. Irene Limbo on 09/19/19 and after thorough discussion of these abnormalities and their possible meanings it was decided to proceed with a bone marrow biopsy to further direct care.  [name] states she is not really sure why she is here today, she understands that there were some abnormalities in her most recent labs but is not sure why a bone marrow biopsy is needed. She is wondering if this is related to her chronic back pain. After discussion of the etiology of plasma cell dyscrasias and indication for bone marrow biopsy she states understanding and is ready to proceed. Per RN patient took her usual dose of insulin this AM and CBG is currently 70, patient tremulous on exam which is not typical for her.   Past Medical History:  Diagnosis Date  . Anginal pain ([place]) 03/19/2012   saw Dr. Cathie Olden .. she thinks its more related to stomach issues  . Arthritis    "all over; mostly in my back" (03/20/2012)  . Breast cancer ([place])    "right" (03/20/2012)  . Cardiomyopathy   . Chest pain 06/2005   Hospitalized, dystolic dysfunction  . Chronic lower back pain    "have 2 herniated discs; going to have to have a fusion" (03/20/2012)  . CKD (chronic kidney disease), stage III   . Family history of anesthesia complication    "father has nausea and vomiting"  . Gallstones   . GERD  (gastroesophageal reflux disease)   . H/O hiatal hernia   . History of kidney stones   . History of right mastectomy   . Hypertension    "patient states never had HTN, takes Hyzaar for heart  . Hypothyroidism   . Irritable bowel syndrome   . Migraines    "it's been a long time since I've had one" (03/20/2012)  . Osteoarthritis   . Peripheral neuropathy   . PONV (postoperative nausea and vomiting)   . Sciatica   . Type II diabetes mellitus ([place])     Past Surgical History:  Procedure Laterality Date  . ABDOMINAL HYSTERECTOMY  1993  . adenosin cardiolite  07/2005   (+) wall motion abnormality  . AXILLARY LYMPH NODE DISSECTION  2003   squamous cell cancer  . BACK SURGERY    . CARDIAC CATHETERIZATION  ? 2005 / 2007  . CHOLECYSTECTOMY N/A 01/24/2018   Procedure: LAPAROSCOPIC CHOLECYSTECTOMY;  Surgeon: Erroll Luna, MD;  Location: [place];  Service: General;  Laterality: N/A;  . CT abdomen and pelvis  02/2010   Same  . ESOPHAGOGASTRODUODENOSCOPY  2005   GERD  . KNEE ARTHROSCOPY  1990's   "right" (03/20/2012)  . LUMBAR FUSION N/A 08/22/2014   Procedure: Right L2-3 and right L1-2 transforaminal lumbar interbody fusion with pedicle screws, rods, sleeves, cages, local bone graft, Vivigen, cancellous chips;  Surgeon: Jessy Oto, MD;  Location: [place];  Service: Orthopedics;  Laterality: N/A;  . LUMBAR LAMINECTOMY/DECOMPRESSION MICRODISCECTOMY N/A 02/04/2014   Procedure:  RIGHT L2-3 MICRODISCECTOMY ;  Surgeon: Jessy Oto, MD;  Location: [place];  Service: Orthopedics;  Laterality: N/A;  . MASTECTOMY Right   . MASTECTOMY WITH AXILLARY LYMPH NODE DISSECTION  12/2003   "right" (03/20/2012)  . POSTERIOR CERVICAL FUSION/FORAMINOTOMY  2001  . SHOULDER ARTHROSCOPY W/ ROTATOR CUFF REPAIR  2003   Left  . THYROIDECTOMY  1977   Right  . TOTAL KNEE ARTHROPLASTY  2000   Right  . [place] of abdomen  07/2005   Fatty liver / kidney stones    Allergies: Dilaudid [hydromorphone hcl], Lipitor  [atorvastatin], Adhesive [tape], Ampicillin, Latex, and Penicillins  Medications: Prior to Admission medications   Medication Sig Start Date End Date Taking? Authorizing Provider  acetaminophen (TYLENOL) 500 MG tablet Take 1,000 mg by mouth every 6 (six) hours as needed for moderate pain or headache.   Yes [provider]  Alcohol Swabs (B-D SINGLE USE SWABS REGULAR) PADS Use as directed. 06/14/19  Yes [name], MD  amLODipine (NORVASC) 10 MG tablet TAKE 1 TABLET BY MOUTH EVERY DAY 03/29/19  Yes Susy Frizzle, MD  aspirin 81 MG tablet Take 81 mg by mouth daily.   Yes [provider]  Blood Glucose Calibration (TRUE METRIX LEVEL 2) Normal SOLN Use as directed 05/17/17  Yes Susy Frizzle, MD  Blood Glucose Monitoring Suppl (TRUE METRIX AIR GLUCOSE METER) w/Device KIT 1 kit by Does not apply route 3 (three) times daily. 06/14/19  Yes [name], MD  carvedilol (COREG) 6.25 MG tablet Take 1 tablet (6.25 mg total) by mouth 2 (two) times daily with a meal. 06/21/19  Yes [name], MD  clotrimazole-betamethasone (LOTRISONE) cream Apply 1 application topically 2 (two) times daily. 08/15/19  Yes [name], MD  ferrous gluconate (FERGON) 324 MG tablet Take 1 tablet (324 mg total) by mouth 2 (two) times daily with a meal. 07/24/19  Yes Noemi Chapel P, DO  fluticasone (FLONASE) 50 MCG/ACT nasal spray Place 2 sprays into both nostrils daily. 05/10/17  Yes Susy Frizzle, MD  glipiZIDE (GLUCOTROL XL) 5 MG 24 hr tablet TAKE 1 TABLET BY MOUTH EVERY DAY WITH BREAKFAST 09/24/19  Yes Nida, Marella Chimes, MD  glucose blood (TRUE METRIX BLOOD GLUCOSE TEST) test strip Use as instructed 06/14/19  Yes [name], MD  insulin NPH-regular Human (70-30) 100 UNIT/ML injection Inject 60 Units into the skin See admin instructions. 60 units with breakfast and 60 units with supper for BG above 90   Yes [provider]  levothyroxine (SYNTHROID) 100 MCG  tablet Take 1 tablet (100 mcg total) by mouth daily. 06/21/19  Yes [name], MD  losartan (COZAAR) 50 MG tablet Take 50 mg by mouth daily.   Yes [provider]  metoCLOPramide (REGLAN) 10 MG tablet Take 1 tablet (10 mg total) by mouth 4 (four) times daily. 08/21/19  Yes [name], MD  pantoprazole (PROTONIX) 40 MG tablet Take 1 tablet (40 mg total) by mouth 2 (two) times daily. 06/21/19  Yes [name], MD  pregabalin (LYRICA) 75 MG capsule TAKE 1 CAPSULE BY MOUTH TWICE A DAY 02/12/19  Yes [name], DPM  rosuvastatin (CRESTOR) 20 MG tablet Take 1 tablet (20 mg total) by mouth daily at 6 PM. 08/21/19  Yes [name], MD  traMADol (ULTRAM) 50 MG tablet TAKE 2 TABLETS (100 MG TOTAL) BY MOUTH EVERY 6 (SIX) HOURS AS NEEDED FOR UP TO 7 DAYS. 09/30/19 10/07/19  Yes Jessy Oto, MD  TRUEplus Lancets 33G MISC Use as directed 06/14/19  Yes [name], MD  albuterol (VENTOLIN HFA) 108 (90 Base) MCG/ACT inhaler Inhale 2 puffs into the lungs every 6 (six) hours as needed for wheezing or shortness of breath. 04/20/19   Desiree Hane, MD  Continuous Blood Gluc Sensor (FREESTYLE LIBRE 14 DAY SENSOR) MISC Inject 1 each into the skin every 14 (fourteen) days. Use as directed. 09/02/19   Cassandria Anger, MD  diclofenac Sodium (VOLTAREN) 1 % GEL Apply 4 g topically 4 (four) times daily as needed. 04/20/19   Desiree Hane, MD  furosemide (LASIX) 40 MG tablet TAKE 1 TABLET (40 MG TOTAL) BY MOUTH DAILY AS NEEDED (LEG SWELLING). 01/09/19   Susy Frizzle, MD  polyethylene glycol (MIRALAX / Floria Raveling) packet Take 17 g by mouth daily as needed for moderate constipation.    [provider]     Family History  Problem Relation Age of Onset  . Hypertension Mother   . Diabetes Father   . Hypertension Father   . Hypertension Sister   . Hypertension Brother   . Hypertension Brother   . Diabetes Brother   . Diabetes Brother   . Diabetes Other         Family Hx. of Diabetes  . Hypertension Other        Family History of HTN  . Deep vein thrombosis Daughter   . Breast cancer Maternal Aunt   . Colon cancer Neg Hx   . Esophageal cancer Neg Hx   . Liver cancer Neg Hx   . Rectal cancer Neg Hx   . Stomach cancer Neg Hx     Social History   Socioeconomic History  . Marital status: Married    Spouse name: Not on file  . Number of children: 1  . Years of education: Not on file  . Highest education level: Not on file  Occupational History  . Occupation: Retired since 2003  Tobacco Use  . Smoking status: Never Smoker  . Smokeless tobacco: Never Used  Vaping Use  . Vaping Use: Never used  Substance and Sexual Activity  . Alcohol use: No  . Drug use: No  . Sexual activity: Not on file  Other Topics Concern  . Not on file  Social History Narrative  . Not on file   Social Determinants of Health   Financial Resource Strain:   . Difficulty of Paying Living Expenses:   Food Insecurity: No Food Insecurity  . Worried About Charity fundraiser in the Last Year: Never true  . Ran Out of Food in the Last Year: Never true  Transportation Needs: No Transportation Needs  . Lack of Transportation (Medical): No  . Lack of Transportation (Non-Medical): No  Physical Activity:   . Days of Exercise per Week:   . Minutes of Exercise per Session:   Stress:   . Feeling of Stress :   Social Connections:   . Frequency of Communication with Friends and Family:   . Frequency of Social Gatherings with Friends and Family:   . Attends Religious Services:   . Active Member of Clubs or Organizations:   . Attends Archivist Meetings:   [name] Kitchen Marital Status:      Review of Systems: A 12 point ROS discussed and pertinent positives are indicated in the HPI above.  All other systems are negative.  Review of Systems  Constitutional: Negative for appetite change,  chills and fever.  Respiratory: Negative for cough and shortness of breath.     Cardiovascular: Negative for chest pain.  Gastrointestinal: Positive for abdominal pain. Negative for blood in stool, diarrhea, nausea and vomiting.  Genitourinary: Negative for dysuria and hematuria.  Musculoskeletal: Positive for arthralgias and back pain.  Neurological: Negative for dizziness and headaches.    Vital Signs: Ht '5\' 4"'  (1.626 m)   Wt 207 lb (93.9 kg)   BMI 35.53 kg/m   Physical Exam Vitals reviewed.  Constitutional:      General: She is not in acute distress. HENT:     Head: Normocephalic.     Mouth/Throat:     Mouth: Mucous membranes are moist.     Pharynx: Oropharynx is clear. No oropharyngeal exudate or posterior oropharyngeal erythema.  Cardiovascular:     Rate and Rhythm: Normal rate and regular rhythm.     Pulses: Normal pulses.     Heart sounds: Normal heart sounds.  Pulmonary:     Effort: Pulmonary effort is normal.     Breath sounds: Normal breath sounds.  Abdominal:     Palpations: Abdomen is soft.  Skin:    General: Skin is warm and dry.  Neurological:     Mental Status: She is alert and oriented to person, place, and time.     Comments: (+) bilateral upper extremity tremor  Psychiatric:        Mood and Affect: Mood normal.        Behavior: Behavior normal.        Thought Content: Thought content normal.        Judgment: Judgment normal.      MD Evaluation Airway: WNL Heart: WNL Abdomen: WNL Chest/ Lungs: WNL ASA  Classification: 3 Mallampati/Airway Score: Two   Imaging: US RENAL  Result Date: 09/14/2019 CLINICAL DATA:  Initial evaluation for chronic kidney disease, stage G3b/A1. EXAM: RENAL / URINARY TRACT ULTRASOUND COMPLETE COMPARISON:  Prior CT from 12/25/2017. FINDINGS: Right Kidney: Renal measurements: 11.7 x 6.3 x 6.0 cm = volume: 234.8 mL. Increased echogenicity within the renal parenchyma. No nephrolithiasis or hydronephrosis. No focal renal mass. Left Kidney: Renal measurements: 10.6 x 6.1 x 5.3 cm = volume: 178.1 mL.  Diffusely increased echogenicity within the renal parenchyma. No nephrolithiasis or hydronephrosis. 1.2 x 1.1 x 1.0 cm complex cyst with internal calcification present at the mid-upper pole, stable from prior CT, consistent with a benign finding. Bladder: Appears normal for degree of bladder distention. Other: None. IMPRESSION: 1. Increased echogenicity within the renal parenchyma, consistent with medical renal disease. 2. No hydronephrosis. 3. 1.2 cm complex cyst with internal calcification at the interpolar left kidney, stable from previous. Electronically Signed   By: Jeannine Boga M.D.   On: 09/14/2019 08:46   US Guided Needle Placement  Result Date: 09/05/2019  Ultrasound-guided right SI injection: After sterile prep with Betadine, injected 5 cc 1% lidocaine without epinephrine and 40 mg methylprednisolone using a 22-gauge spinal needle, passing the needle through the sacroiliac ligament into the region of the SI joint.  She had a feeling of fullness in her ears after the procedure.  I looked in her ear canal and she had excessive wax buildup, unable to visualize the tympanic membrane.  Regarding her SI pain, she had modest improvement during the immediate anesthetic phase.  DG Bone Survey Met  Result Date: 09/26/2019 CLINICAL DATA:  Monoclonal paraproteinemia. EXAM: METASTATIC BONE SURVEY COMPARISON:  Chest x-ray 04/19/2019. CT chest 04/15/2019. Lumbar spine 11/23/2018. CT abdomen  12/25/2017. FINDINGS: Standard imaging of the axial and appendicular skeleton performed. Surgical clips noted over the chest and abdomen. No acute cardiopulmonary disease or acute intra-abdominal abnormality identified. Diffuse osteopenia. No lytic or sclerotic lesions are noted to suggest myeloma or metastatic disease. Diffuse degenerative change noted about the axial and appendicular skeleton. Prior cervical and lumbar spine fusion. Prior right knee replacement. Postsurgical changes right ankle. Aortic and peripheral  vascular calcification. IMPRESSION: No evidence of lytic or sclerotic lesions to suggest myeloma or metastatic disease. Electronically Signed   By: Marcello Moores  Register   On: 09/26/2019 16:15    Labs:  CBC: Recent Labs    07/17/19 1111 08/05/19 1515 09/19/19 1420 10/02/19 0740  WBC 8.7 6.4 7.2 7.9  HGB 12.7 11.3* 12.9 12.8  HCT 38.4 35.4 40.7 43.2  PLT 310.0 291 251 254    COAGS: No results for input(s): INR, APTT in the last 8760 hours.  BMP: Recent Labs    04/18/19 0511 04/18/19 0511 04/19/19 0458 04/19/19 0458 04/20/19 0230 08/05/19 1515 09/19/19 1420  NA 141   < > 139  --  138 142 137  K 4.5   < > 4.3  --  4.4 4.2 4.9  CL 109   < > 108  --  105 111* 110  CO2 25   < > 25  --  24 24 20*  GLUCOSE 183*   < > 144*  --  223* 129 144*  BUN 24*   < > 28*  --  30* 25 36*  CALCIUM 9.5   < > 9.4   < > 9.4 10.5* 10.6*  10.5*  CREATININE 2.18*   < > 2.13*  --  2.07* 1.55* 1.64*  GFRNONAA 22*  --  23*  --  24*  --  31*  GFRAA 26*  --  26*  --  27*  --  36*   < > = values in this interval not displayed.    LIVER FUNCTION TESTS: Recent Labs    03/01/19 1407 04/17/19 0227 08/05/19 1515 09/19/19 1420  BILITOT 0.3 0.5 0.3  0.3 0.3  AST '19 23 16  16 25  ' ALT '15 18 11  11 21  ' ALKPHOS  --  50  --  65  PROT 7.1 6.3* 6.6  6.6 7.3  ALBUMIN  --  2.9*  --  3.2*    TUMOR MARKERS: No results for input(s): AFPTM, CEA, CA199, CHROMGRNA in the last 8760 hours.  Assessment and Plan:  69 y/o F with recent lab work noting several abnormalities concerning for plasma cell dyscrasia followed by Dr. Irene Limbo who presents today for a bone marrow biopsy to further direct care.  Patient has been NPO since 10 pm, she did take her usual dose of 60 units NPH this morning - CBG at time of arrival 120 with repeat 70, patient noted to be tremulous on exam but otherwise denies complaints. Will give 1 amp D50 and change fluids to D5 with frequent CBG checks prior to procedure.   Risks and benefits  discussed with the patient including, but not limited to bleeding, infection, damage to adjacent structures or low yield requiring additional tests.  All of the patient's questions were answered, patient is agreeable to proceed.  Consent signed and in chart.  Thank you for this interesting consult.  I greatly enjoyed meeting [name] and look forward to participating in their care.  A copy of this report was sent to the requesting provider on  this date.  Electronically Signed: Joaquim Nam, PA-C 10/02/2019, 8:42 AM   I spent a total of 30 Minutes   in face to face in clinical consultation, greater than 50% of which was counseling/coordinating care for bone marrow biopsy.

## 2019-10-02 NOTE — Discharge Instructions (Signed)
Bone Marrow Aspiration and Bone Marrow Biopsy, Adult, Care After This sheet gives you information about how to care for yourself after your procedure. Your health care provider may also give you more specific instructions. If you have problems or questions, contact your health care provider. What can I expect after the procedure? After the procedure, it is common to have:  Mild pain and tenderness.  Swelling.  Bruising. Follow these instructions at home: Puncture site care   Follow instructions from your health care provider about how to take care of the puncture site. Make sure you: ? Wash your hands with soap and water before and after you change your bandage (dressing). If soap and water are not available, use hand sanitizer. ? Change your dressing as told by your health care provider.  Check your puncture site every day for signs of infection. Check for: ? More redness, swelling, or pain. ? Fluid or blood. ? Warmth. ? Pus or a bad smell. Activity  Return to your normal activities as told by your health care provider. Ask your health care provider what activities are safe for you.  Do not lift anything that is heavier than 10 lb (4.5 kg), or the limit that you are told, until your health care provider says that it is safe.  Do not drive for 24 hours if you were given a sedative during your procedure. General instructions   Take over-the-counter and prescription medicines only as told by your health care provider.  Do not take baths, swim, or use a hot tub until your health care provider approves. Ask your health care provider if you may take showers. You may only be allowed to take sponge baths.  If directed, put ice on the affected area. To do this: ? Put ice in a plastic bag. ? Place a towel between your skin and the bag. ? Leave the ice on for 20 minutes, 2-3 times a day.  Keep all follow-up visits as told by your health care provider. This is important. Contact a  health care provider if:  Your pain is not controlled with medicine.  You have a fever.  You have more redness, swelling, or pain around the puncture site.  You have fluid or blood coming from the puncture site.  Your puncture site feels warm to the touch.  You have pus or a bad smell coming from the puncture site. Summary  After the procedure, it is common to have mild pain, tenderness, swelling, and bruising.  Follow instructions from your health care provider about how to take care of the puncture site and what activities are safe for you.  Take over-the-counter and prescription medicines only as told by your health care provider.  Contact a health care provider if you have any signs of infection, such as fluid or blood coming from the puncture site. This information is not intended to replace advice given to you by your health care provider. Make sure you discuss any questions you have with your health care provider. Document Revised: 08/14/2018 Document Reviewed: 08/14/2018 Elsevier Patient Education  [place]. Moderate Conscious Sedation, Adult Sedation is the use of medicines to promote relaxation and relieve discomfort and anxiety. Moderate conscious sedation is a type of sedation. Under moderate conscious sedation, you are less alert than normal, but you are still able to respond to instructions, touch, or both. Moderate conscious sedation is used during short medical and dental procedures. It is milder than deep sedation, which is a type  of sedation under which you cannot be easily woken up. It is also milder than general anesthesia, which is the use of medicines to make you unconscious. Moderate conscious sedation allows you to return to your regular activities sooner. Tell a health care provider about:  Any allergies you have.  All medicines you are taking, including vitamins, herbs, eye drops, creams, and over-the-counter medicines.  Use of steroids (by mouth  or creams).  Any problems you or family members have had with sedatives and anesthetic medicines.  Any blood disorders you have.  Any surgeries you have had.  Any medical conditions you have, such as sleep apnea.  Whether you are pregnant or may be pregnant.  Any use of cigarettes, alcohol, marijuana, or street drugs. What are the risks? Generally, this is a safe procedure. However, problems may occur, including:  Getting too much medicine (oversedation).  Nausea.  Allergic reaction to medicines.  Trouble breathing. If this happens, a breathing tube may be used to help with breathing. It will be removed when you are awake and breathing on your own.  Heart trouble.  Lung trouble. What happens before the procedure? Staying hydrated Follow instructions from your health care provider about hydration, which may include:  Up to 2 hours before the procedure - you may continue to drink clear liquids, such as water, clear fruit juice, black coffee, and plain tea. Eating and drinking restrictions Follow instructions from your health care provider about eating and drinking, which may include:  8 hours before the procedure - stop eating heavy meals or foods such as meat, fried foods, or fatty foods.  6 hours before the procedure - stop eating light meals or foods, such as toast or cereal.  6 hours before the procedure - stop drinking milk or drinks that contain milk.  2 hours before the procedure - stop drinking clear liquids. Medicine Ask your health care provider about:  Changing or stopping your regular medicines. This is especially important if you are taking diabetes medicines or blood thinners.  Taking medicines such as aspirin and ibuprofen. These medicines can thin your blood. Do not take these medicines before your procedure if your health care provider instructs you not to.  Tests and exams  You will have a physical exam.  You may have blood tests done to  show: ? How well your kidneys and liver are working. ? How well your blood can clot. General instructions  Plan to have someone take you home from the hospital or clinic.  If you will be going home right after the procedure, plan to have someone with you for 24 hours. What happens during the procedure?  An IV tube will be inserted into one of your veins.  Medicine to help you relax (sedative) will be given through the IV tube.  The medical or dental procedure will be performed. What happens after the procedure?  Your blood pressure, heart rate, breathing rate, and blood oxygen level will be monitored often until the medicines you were given have worn off.  Do not drive for 24 hours. This information is not intended to replace advice given to you by your health care provider. Make sure you discuss any questions you have with your health care provider. Document Revised: 03/10/2017 Document Reviewed: 07/18/2015 Elsevier Patient Education  2020 [place] Radiologist PA or IR on call [phone]

## 2019-10-02 NOTE — Procedures (Signed)
Interventional Radiology Procedure:   Indications: Monoclonal Paraproteinemia - Elevated kappa light chains. R/o plasma cell dyscrasia.  Procedure: CT guided bone marrow biopsy  Findings: 2 aspirates and 1 core from right ilium  Complications: None     EBL: Minimal, less than 10 ml  Plan: Discharge to home in one hour.   Tavie Haseman R. Anselm Pancoast, MD  Pager: [phone]

## 2019-10-04 LAB — SURGICAL PATHOLOGY

## 2019-10-08 ENCOUNTER — Other Ambulatory Visit: Payer: Self-pay | Admitting: Radiology

## 2019-10-09 ENCOUNTER — Encounter (HOSPITAL_COMMUNITY): Payer: Self-pay

## 2019-10-09 ENCOUNTER — Other Ambulatory Visit: Payer: Self-pay

## 2019-10-09 ENCOUNTER — Ambulatory Visit (HOSPITAL_COMMUNITY)
Admission: RE | Admit: 2019-10-09 | Discharge: 2019-10-09 | Disposition: A | Discharge status: Home / Self Care | Payer: Medicare HMO | Source: Ambulatory Visit | Attending: Nephrology | Admitting: Nephrology

## 2019-10-09 DIAGNOSIS — E1129 Type 2 diabetes mellitus with other diabetic kidney complication: Secondary | ICD-10-CM

## 2019-10-09 DIAGNOSIS — K589 Irritable bowel syndrome without diarrhea: Secondary | ICD-10-CM | POA: Insufficient documentation

## 2019-10-09 DIAGNOSIS — Z880 Allergy status to penicillin: Secondary | ICD-10-CM | POA: Diagnosis not present

## 2019-10-09 DIAGNOSIS — Z8249 Family history of ischemic heart disease and other diseases of the circulatory system: Secondary | ICD-10-CM | POA: Insufficient documentation

## 2019-10-09 DIAGNOSIS — R808 Other proteinuria: Secondary | ICD-10-CM | POA: Diagnosis not present

## 2019-10-09 DIAGNOSIS — R809 Proteinuria, unspecified: Secondary | ICD-10-CM | POA: Diagnosis not present

## 2019-10-09 DIAGNOSIS — Z96651 Presence of right artificial knee joint: Secondary | ICD-10-CM | POA: Diagnosis not present

## 2019-10-09 DIAGNOSIS — Z981 Arthrodesis status: Secondary | ICD-10-CM | POA: Diagnosis not present

## 2019-10-09 DIAGNOSIS — Z79899 Other long term (current) drug therapy: Secondary | ICD-10-CM | POA: Diagnosis not present

## 2019-10-09 DIAGNOSIS — Z794 Long term (current) use of insulin: Secondary | ICD-10-CM | POA: Diagnosis not present

## 2019-10-09 DIAGNOSIS — Z87442 Personal history of urinary calculi: Secondary | ICD-10-CM | POA: Insufficient documentation

## 2019-10-09 DIAGNOSIS — D472 Monoclonal gammopathy: Secondary | ICD-10-CM | POA: Diagnosis not present

## 2019-10-09 DIAGNOSIS — R801 Persistent proteinuria, unspecified: Secondary | ICD-10-CM | POA: Insufficient documentation

## 2019-10-09 DIAGNOSIS — Z881 Allergy status to other antibiotic agents status: Secondary | ICD-10-CM | POA: Insufficient documentation

## 2019-10-09 DIAGNOSIS — Z853 Personal history of malignant neoplasm of breast: Secondary | ICD-10-CM | POA: Insufficient documentation

## 2019-10-09 DIAGNOSIS — E890 Postprocedural hypothyroidism: Secondary | ICD-10-CM | POA: Insufficient documentation

## 2019-10-09 DIAGNOSIS — N183 Chronic kidney disease, stage 3 unspecified: Secondary | ICD-10-CM | POA: Insufficient documentation

## 2019-10-09 DIAGNOSIS — Z888 Allergy status to other drugs, medicaments and biological substances status: Secondary | ICD-10-CM | POA: Diagnosis not present

## 2019-10-09 DIAGNOSIS — Z90710 Acquired absence of both cervix and uterus: Secondary | ICD-10-CM | POA: Insufficient documentation

## 2019-10-09 DIAGNOSIS — Z9011 Acquired absence of right breast and nipple: Secondary | ICD-10-CM | POA: Diagnosis not present

## 2019-10-09 DIAGNOSIS — Z885 Allergy status to narcotic agent status: Secondary | ICD-10-CM | POA: Diagnosis not present

## 2019-10-09 DIAGNOSIS — K219 Gastro-esophageal reflux disease without esophagitis: Secondary | ICD-10-CM | POA: Insufficient documentation

## 2019-10-09 LAB — CBC
HCT: 38.6 % (ref 36.0–46.0)
Hemoglobin: 11.8 g/dL — ABNORMAL LOW (ref 12.0–15.0)
MCH: 28.4 pg (ref 26.0–34.0)
MCHC: 30.6 g/dL (ref 30.0–36.0)
MCV: 93 fL (ref 80.0–100.0)
Platelets: 290 10*3/uL (ref 150–400)
RBC: 4.15 MIL/uL (ref 3.87–5.11)
RDW: 15.2 % (ref 11.5–15.5)
WBC: 8.7 10*3/uL (ref 4.0–10.5)
nRBC: 0 % (ref 0.0–0.2)

## 2019-10-09 LAB — PROTIME-INR
INR: 0.9 (ref 0.8–1.2)
Prothrombin Time: 12.1 seconds (ref 11.4–15.2)

## 2019-10-09 LAB — GLUCOSE, CAPILLARY
Glucose-Capillary: 56 mg/dL — ABNORMAL LOW (ref 70–99)
Glucose-Capillary: 68 mg/dL — ABNORMAL LOW (ref 70–99)
Glucose-Capillary: 53 mg/dL — ABNORMAL LOW (ref 70–99)
Glucose-Capillary: 81 mg/dL (ref 70–99)
Glucose-Capillary: 81 mg/dL (ref 70–99)
Glucose-Capillary: 104 mg/dL — ABNORMAL HIGH (ref 70–99)
Glucose-Capillary: 76 mg/dL (ref 70–99)

## 2019-10-09 MED ORDER — DEXTROSE 50 % IV SOLN
INTRAVENOUS | Status: AC
  Filled 2019-10-09: qty 50

## 2019-10-09 MED ORDER — LIDOCAINE HCL (PF) 1 % IJ SOLN
INTRAMUSCULAR | Status: AC
  Filled 2019-10-09: qty 30

## 2019-10-09 MED ORDER — HYDRALAZINE HCL 20 MG/ML IJ SOLN
INTRAMUSCULAR | Status: AC | PRN
  Administered 2019-10-09: 10 mg via INTRAVENOUS

## 2019-10-09 MED ORDER — GLUCOSE 4 G PO CHEW
1.0000 | CHEWABLE_TABLET | Freq: Once | ORAL | Status: AC
  Administered 2019-10-09: 4 g via ORAL

## 2019-10-09 MED ORDER — HYDRALAZINE HCL 20 MG/ML IJ SOLN
INTRAMUSCULAR | Status: AC
  Filled 2019-10-09: qty 1

## 2019-10-09 MED ORDER — GLUCOSE 4 G PO CHEW
1.0000 | CHEWABLE_TABLET | Freq: Once | ORAL | Status: AC
  Administered 2019-10-09: 4 g via ORAL
  Filled 2019-10-09: qty 1

## 2019-10-09 MED ORDER — MIDAZOLAM HCL 2 MG/2ML IJ SOLN
INTRAMUSCULAR | Status: AC
  Filled 2019-10-09: qty 2

## 2019-10-09 MED ORDER — FENTANYL CITRATE (PF) 100 MCG/2ML IJ SOLN
INTRAMUSCULAR | Status: AC | PRN
  Administered 2019-10-09: 50 ug via INTRAVENOUS

## 2019-10-09 MED ORDER — GELATIN ABSORBABLE 12-7 MM EX MISC
CUTANEOUS | Status: AC
  Filled 2019-10-09: qty 1

## 2019-10-09 MED ORDER — SODIUM CHLORIDE 0.9 % IV SOLN
INTRAVENOUS | Status: AC | PRN
  Administered 2019-10-09: 10 mL/h via INTRAVENOUS

## 2019-10-09 MED ORDER — GLUCOSE 4 G PO CHEW: 1.0000 | CHEWABLE_TABLET | Freq: Once | ORAL | Status: AC

## 2019-10-09 MED ORDER — SODIUM CHLORIDE 0.9 % IV SOLN: INTRAVENOUS | Status: DC

## 2019-10-09 MED ORDER — FENTANYL CITRATE (PF) 100 MCG/2ML IJ SOLN
INTRAMUSCULAR | Status: AC
  Filled 2019-10-09: qty 2

## 2019-10-09 MED ORDER — MIDAZOLAM HCL 2 MG/2ML IJ SOLN
INTRAMUSCULAR | Status: AC | PRN
  Administered 2019-10-09: 1 mg via INTRAVENOUS

## 2019-10-09 MED ORDER — GLUCOSE 4 G PO CHEW
CHEWABLE_TABLET | ORAL | Status: AC
  Administered 2019-10-09: 4 g via ORAL
  Filled 2019-10-09: qty 1

## 2019-10-09 NOTE — H&P (Signed)
Chief Complaint: Patient was seen in consultation today for random renal biopsy at the request of [name]  Referring Physician(s): [name]  Supervising Physician: [name]  Patient Status: [name] - Out-pt  History of Present Illness: [name] is a 69 y.o. female   Hx Breast Ca Per Dr Irene Limbo note:  1. Monoclonal Paraproteinemia - Elevated kappa light chains. R/o plasma cell dyscrasia. 2. hypercalcemia 3. CKD with possibly nephrotic range proteinuria Hx Bone marrow biopsy 10/02/19:  BONE MARROW, ASPIRATE, CLOT, CORE:  -Slightly hypercellular bone marrow for age with trilineage  hematopoiesis  -Slight plasmacytosis   Persistent proteinuria Nephrotic range Hypercalcemia-- to R/O Amyloid per Dr [name] today for random renal biopsy She states she feels shaky and cold Denies N/V Glucose 68  (finger stick in SSC) Pt has not taken any meds this am-- Insulin 1000 pm last night (40 u instead of 60) RN administered 2 po glucose tabs in SSC:  20 min wait time--Repeat BS 56 Another glucose tablet: 20 min wait time-- repeat BS 53 Unable to give IV meds/fluids secondary inability to gain IV access-- IV team called  Pt states she is feeling some better; less shaky Denies N/V 2 more po glucose tabs taken  Another BS at 8 am:  81  Pt now in IR RN station; additional tablet po Repeat BS now 104. Pt has ambulated to restroom; denies nausea; no sweats; no shaky  Will proceed with procedure today per Dr [name]  Past Medical History:  Diagnosis Date   Anginal pain ([place]) 03/19/2012   saw Dr. Cathie Olden .. she thinks its more related to stomach issues   Arthritis    "all over; mostly in my back" (03/20/2012)   Breast cancer ([place])    "right" (03/20/2012)   Cardiomyopathy    Chest pain 06/2005   Hospitalized, dystolic dysfunction   Chronic lower back pain    "have 2 herniated discs; going to have to have a fusion" (03/20/2012)   CKD (chronic kidney  disease), stage III    Family history of anesthesia complication    "father has nausea and vomiting"   Gallstones    GERD (gastroesophageal reflux disease)    H/O hiatal hernia    History of kidney stones    History of right mastectomy    Hypertension    "patient states never had HTN, takes Hyzaar for heart   Hypothyroidism    Irritable bowel syndrome    Migraines    "it's been a long time since I've had one" (03/20/2012)   Osteoarthritis    Peripheral neuropathy    PONV (postoperative nausea and vomiting)    Sciatica    Type II diabetes mellitus ([place])     Past Surgical History:  Procedure Laterality Date   ABDOMINAL HYSTERECTOMY  1993   adenosin cardiolite  07/2005   (+) wall motion abnormality   AXILLARY LYMPH NODE DISSECTION  2003   squamous cell cancer   BACK SURGERY     CARDIAC CATHETERIZATION  ? 2005 / 2007   CHOLECYSTECTOMY N/A 01/24/2018   Procedure: LAPAROSCOPIC CHOLECYSTECTOMY;  Surgeon: Erroll Luna, MD;  Location: [facility];  Service: General;  Laterality: N/A;   CT abdomen and pelvis  02/2010   Same   ESOPHAGOGASTRODUODENOSCOPY  2005   GERD   KNEE ARTHROSCOPY  1990's   "right" (03/20/2012)   LUMBAR FUSION N/A 08/22/2014   Procedure: Right L2-3 and right L1-2 transforaminal lumbar interbody fusion  with pedicle screws, rods, sleeves, cages, local bone graft, Vivigen, cancellous chips;  Surgeon: Jessy Oto, MD;  Location: [facility];  Service: Orthopedics;  Laterality: N/A;   LUMBAR LAMINECTOMY/DECOMPRESSION MICRODISCECTOMY N/A 02/04/2014   Procedure: RIGHT L2-3 MICRODISCECTOMY ;  Surgeon: Jessy Oto, MD;  Location: [facility];  Service: Orthopedics;  Laterality: N/A;   MASTECTOMY Right    MASTECTOMY WITH AXILLARY LYMPH NODE DISSECTION  12/2003   "right" (03/20/2012)   POSTERIOR CERVICAL FUSION/FORAMINOTOMY  2001   SHOULDER ARTHROSCOPY W/ ROTATOR CUFF REPAIR  2003   Left   THYROIDECTOMY  1977   Right   TOTAL KNEE ARTHROPLASTY  2000    Right   [place] of abdomen  07/2005   Fatty liver / kidney stones    Allergies: Dilaudid [hydromorphone hcl], Lipitor [atorvastatin], Adhesive [tape], Ampicillin, Latex, and Penicillins  Medications: Prior to Admission medications   Medication Sig Start Date End Date Taking? Authorizing Provider  amLODipine (NORVASC) 10 MG tablet TAKE 1 TABLET BY MOUTH EVERY DAY Patient taking differently: Take 10 mg by mouth daily.  03/29/19  Yes Susy Frizzle, MD  carvedilol (COREG) 6.25 MG tablet Take 1 tablet (6.25 mg total) by mouth 2 (two) times daily with a meal. 06/21/19  Yes [name], MD  clotrimazole-betamethasone (LOTRISONE) cream Apply 1 application topically 2 (two) times daily. Patient taking differently: Apply 1 application topically 2 (two) times daily as needed (irritation).  08/15/19  Yes [name], MD  diclofenac Sodium (VOLTAREN) 1 % GEL Apply 4 g topically 4 (four) times daily as needed. Patient taking differently: Apply 4 g topically 4 (four) times daily as needed (pain).  04/20/19  Yes Oretha Milch D, MD  ferrous gluconate (FERGON) 324 MG tablet Take 1 tablet (324 mg total) by mouth 2 (two) times daily with a meal. 07/24/19  Yes Noemi Chapel P, DO  fluticasone (FLONASE) 50 MCG/ACT nasal spray Place 2 sprays into both nostrils daily. Patient taking differently: Place 2 sprays into both nostrils daily as needed for allergies.  05/10/17  Yes Susy Frizzle, MD  furosemide (LASIX) 40 MG tablet TAKE 1 TABLET (40 MG TOTAL) BY MOUTH DAILY AS NEEDED (LEG SWELLING). 01/09/19  Yes Susy Frizzle, MD  glipiZIDE (GLUCOTROL XL) 5 MG 24 hr tablet TAKE 1 TABLET BY MOUTH EVERY DAY WITH BREAKFAST Patient taking differently: Take 5 mg by mouth daily with breakfast.  09/24/19  Yes Nida, Marella Chimes, MD  hydrALAZINE (APRESOLINE) 25 MG tablet Take 25 mg by mouth in the morning, at noon, and at bedtime. 09/17/19  Yes [provider]  insulin NPH-regular Human (70-30) 100  UNIT/ML injection Inject 60 Units into the skin See admin instructions. 60 units with breakfast and 60 units with supper for BG above 90   Yes [provider]  levothyroxine (SYNTHROID) 100 MCG tablet Take 1 tablet (100 mcg total) by mouth daily. 06/21/19  Yes [name], MD  losartan (COZAAR) 50 MG tablet Take 50 mg by mouth daily.   Yes [provider]  metoCLOPramide (REGLAN) 10 MG tablet Take 1 tablet (10 mg total) by mouth 4 (four) times daily. Patient taking differently: Take 10 mg by mouth every 6 (six) hours as needed for nausea or vomiting.  08/21/19  Yes [name], MD  pantoprazole (PROTONIX) 40 MG tablet Take 1 tablet (40 mg total) by mouth 2 (two) times daily. Patient taking differently: Take 40 mg by mouth 2 (two) times daily as needed (acid  reflux).  06/21/19  Yes [name], MD  polyethylene glycol (MIRALAX / GLYCOLAX) packet Take 17 g by mouth daily as needed for moderate constipation.   Yes [provider]  pregabalin (LYRICA) 75 MG capsule TAKE 1 CAPSULE BY MOUTH TWICE A DAY Patient taking differently: Take 75 mg by mouth 2 (two) times daily.  02/12/19  Yes [name], DPM  rosuvastatin (CRESTOR) 20 MG tablet Take 1 tablet (20 mg total) by mouth daily at 6 PM. 08/21/19  Yes [name], MD  acetaminophen (TYLENOL) 500 MG tablet Take 1,000 mg by mouth every 6 (six) hours as needed for moderate pain or headache.    [provider]  albuterol (VENTOLIN HFA) 108 (90 Base) MCG/ACT inhaler Inhale 2 puffs into the lungs every 6 (six) hours as needed for wheezing or shortness of breath. Patient not taking: Reported on 10/04/2019 04/20/19   Oretha Milch D, MD  Alcohol Swabs (B-D SINGLE USE SWABS REGULAR) PADS Use as directed. 06/14/19   [name], MD  aspirin 81 MG tablet Take 81 mg by mouth daily.    [provider]  Blood Glucose Calibration (TRUE METRIX LEVEL 2) Normal SOLN Use as directed 05/17/17    Susy Frizzle, MD  Blood Glucose Monitoring Suppl (TRUE METRIX AIR GLUCOSE METER) w/Device KIT 1 kit by Does not apply route 3 (three) times daily. 06/14/19   [name], MD  Continuous Blood Gluc Sensor (FREESTYLE LIBRE 14 DAY SENSOR) MISC Inject 1 each into the skin every 14 (fourteen) days. Use as directed. Patient not taking: Reported on 10/04/2019 09/02/19   Cassandria Anger, MD  glucose blood (TRUE METRIX BLOOD GLUCOSE TEST) test strip Use as instructed 06/14/19   [name], MD  TRUEplus Lancets 33G MISC Use as directed 06/14/19   [name], MD     Family History  Problem Relation Age of Onset   Hypertension Mother    Diabetes Father    Hypertension Father    Hypertension Sister    Hypertension Brother    Hypertension Brother    Diabetes Brother    Diabetes Brother    Diabetes Other        Family Hx. of Diabetes   Hypertension Other        Family History of HTN   Deep vein thrombosis Daughter    Breast cancer Maternal Aunt    Colon cancer Neg Hx    Esophageal cancer Neg Hx    Liver cancer Neg Hx    Rectal cancer Neg Hx    Stomach cancer Neg Hx     Social History   Socioeconomic History   Marital status: Married    Spouse name: Not on file   Number of children: 1   Years of education: Not on file   Highest education level: Not on file  Occupational History   Occupation: Retired since 2003  Tobacco Use   Smoking status: Never Smoker   Smokeless tobacco: Never Used  Scientific laboratory technician Use: Never used  Substance and Sexual Activity   Alcohol use: No   Drug use: No   Sexual activity: Not on file  Other Topics Concern   Not on file  Social History Narrative   Not on file   Social Determinants of Health   Financial Resource Strain:    Difficulty of Paying Living Expenses:   Food Insecurity: No Food Insecurity   Worried About Running Out of  Food in the Last Year: Never true   [place] in  the Last Year: Never true  Transportation Needs: No Transportation Needs   Lack of Transportation (Medical): No   Lack of Transportation (Non-Medical): No  Physical Activity:    Days of Exercise per Week:    Minutes of Exercise per Session:   Stress:    Feeling of Stress :   Social Connections:    Frequency of Communication with Friends and Family:    Frequency of Social Gatherings with Friends and Family:    Attends Religious Services:    Active Member of Clubs or Organizations:    Attends Archivist Meetings:    Marital Status:     Review of Systems: A 12 point ROS discussed and pertinent positives are indicated in the HPI above.  All other systems are negative.  Review of Systems  Constitutional: Negative for activity change, fatigue and fever.  Respiratory: Negative for cough and shortness of breath.   Cardiovascular: Negative for chest pain.  Gastrointestinal: Negative for nausea.  Neurological: Negative for weakness.  Psychiatric/Behavioral: Negative for behavioral problems.    Vital Signs: BP (!) 157/58    Pulse 67 Comment: 67   Temp 98.6 F (37 Pratt) (Oral)    Resp 14    Ht 5' 4" (1.626 m)    Wt 210 lb (95.3 kg)    SpO2 97%    BMI 36.05 kg/m   Physical Exam Vitals reviewed.  Cardiovascular:     Rate and Rhythm: Normal rate and regular rhythm.     Heart sounds: Normal heart sounds.  Pulmonary:     Effort: Pulmonary effort is normal.     Breath sounds: Normal breath sounds.  Abdominal:     Palpations: Abdomen is soft.  Musculoskeletal:        General: Normal range of motion.  Skin:    General: Skin is warm and dry.  Neurological:     Mental Status: She is alert and oriented to person, place, and time.  Psychiatric:        Behavior: Behavior normal.     Imaging: US RENAL  Result Date: 09/14/2019 CLINICAL DATA:  Initial evaluation for chronic kidney disease, stage G3b/A1. EXAM: RENAL / URINARY TRACT ULTRASOUND COMPLETE COMPARISON:  Prior  CT from 12/25/2017. FINDINGS: Right Kidney: Renal measurements: 11.7 x 6.3 x 6.0 cm = volume: 234.8 mL. Increased echogenicity within the renal parenchyma. No nephrolithiasis or hydronephrosis. No focal renal mass. Left Kidney: Renal measurements: 10.6 x 6.1 x 5.3 cm = volume: 178.1 mL. Diffusely increased echogenicity within the renal parenchyma. No nephrolithiasis or hydronephrosis. 1.2 x 1.1 x 1.0 cm complex cyst with internal calcification present at the mid-upper pole, stable from prior CT, consistent with a benign finding. Bladder: Appears normal for degree of bladder distention. Other: None. IMPRESSION: 1. Increased echogenicity within the renal parenchyma, consistent with medical renal disease. 2. No hydronephrosis. 3. 1.2 cm complex cyst with internal calcification at the interpolar left kidney, stable from previous. Electronically Signed   By: Jeannine Boga M.D.   On: 09/14/2019 08:46   CT Biopsy  Result Date: 10/02/2019 INDICATION: 69 year old with monoclonal paraproteinemia. EXAM: CT GUIDED BONE MARROW ASPIRATES AND BIOPSY Physician: Stephan Minister. Anselm Pancoast, MD MEDICATIONS: None. ANESTHESIA/SEDATION: Fentanyl 100 mcg IV; Versed 2.0 mg IV Moderate Sedation Time:  10 minutes The patient was continuously monitored during the procedure by the interventional radiology nurse under my direct supervision. COMPLICATIONS: None immediate. PROCEDURE: The procedure  was explained to the patient. The risks and benefits of the procedure were discussed and the patient's questions were addressed. Informed consent was obtained from the patient. The patient was placed prone on CT table. Images of the pelvis were obtained. The right side of back was prepped and draped in sterile fashion. The skin and right posterior ilium were anesthetized with 1% lidocaine. 11 gauge bone needle was directed into the right ilium with CT guidance. Two aspirates and one core biopsy were obtained. Bandage placed over the puncture site.  IMPRESSION: CT guided bone marrow aspiration and core biopsy. Electronically Signed   By: Markus Daft M.D.   On: 10/02/2019 10:07   DG Bone Survey Met  Result Date: 09/26/2019 CLINICAL DATA:  Monoclonal paraproteinemia. EXAM: METASTATIC BONE SURVEY COMPARISON:  Chest x-ray 04/19/2019. CT chest 04/15/2019. Lumbar spine 11/23/2018. CT abdomen 12/25/2017. FINDINGS: Standard imaging of the axial and appendicular skeleton performed. Surgical clips noted over the chest and abdomen. No acute cardiopulmonary disease or acute intra-abdominal abnormality identified. Diffuse osteopenia. No lytic or sclerotic lesions are noted to suggest myeloma or metastatic disease. Diffuse degenerative change noted about the axial and appendicular skeleton. Prior cervical and lumbar spine fusion. Prior right knee replacement. Postsurgical changes right ankle. Aortic and peripheral vascular calcification. IMPRESSION: No evidence of lytic or sclerotic lesions to suggest myeloma or metastatic disease. Electronically Signed   By: Marcello Moores  Register   On: 09/26/2019 16:15   CT BONE MARROW BIOPSY & ASPIRATION  Result Date: 10/02/2019 INDICATION: 69 year old with monoclonal paraproteinemia. EXAM: CT GUIDED BONE MARROW ASPIRATES AND BIOPSY Physician: Stephan Minister. Anselm Pancoast, MD MEDICATIONS: None. ANESTHESIA/SEDATION: Fentanyl 100 mcg IV; Versed 2.0 mg IV Moderate Sedation Time:  10 minutes The patient was continuously monitored during the procedure by the interventional radiology nurse under my direct supervision. COMPLICATIONS: None immediate. PROCEDURE: The procedure was explained to the patient. The risks and benefits of the procedure were discussed and the patient's questions were addressed. Informed consent was obtained from the patient. The patient was placed prone on CT table. Images of the pelvis were obtained. The right side of back was prepped and draped in sterile fashion. The skin and right posterior ilium were anesthetized with 1% lidocaine.  11 gauge bone needle was directed into the right ilium with CT guidance. Two aspirates and one core biopsy were obtained. Bandage placed over the puncture site. IMPRESSION: CT guided bone marrow aspiration and core biopsy. Electronically Signed   By: Markus Daft M.D.   On: 10/02/2019 10:07    Labs:  CBC: Recent Labs    08/05/19 1515 09/19/19 1420 10/02/19 0740 10/09/19 0630  WBC 6.4 7.2 7.9 8.7  HGB 11.3* 12.9 12.8 11.8*  HCT 35.4 40.7 43.2 38.6  PLT 291 251 254 290    COAGS: Recent Labs    10/09/19 0630  INR 0.9    BMP: Recent Labs    04/18/19 0511 04/18/19 0511 04/19/19 0458 04/19/19 0458 04/20/19 0230 08/05/19 1515 09/19/19 1420  NA 141   < > 139  --  138 142 137  K 4.5   < > 4.3  --  4.4 4.2 4.9  CL 109   < > 108  --  105 111* 110  CO2 25   < > 25  --  24 24 20*  GLUCOSE 183*   < > 144*  --  223* 129 144*  BUN 24*   < > 28*  --  30* 25 36*  CALCIUM 9.5   < >  9.4   < > 9.4 10.5* 10.6*   10.5*  CREATININE 2.18*   < > 2.13*  --  2.07* 1.55* 1.64*  GFRNONAA 22*  --  23*  --  24*  --  31*  GFRAA 26*  --  26*  --  27*  --  36*   < > = values in this interval not displayed.    LIVER FUNCTION TESTS: Recent Labs    03/01/19 1407 04/17/19 0227 08/05/19 1515 09/19/19 1420  BILITOT 0.3 0.5 0.3   0.3 0.3  AST _0 ALT _1 ALKPHOS  --  50  --  65  PROT 7.1 6.3* 6.6   6.6 7.3  ALBUMIN  --  2.9*  --  3.2*    TUMOR MARKERS: No results for input(s): AFPTM, CEA, CA199, CHROMGRNA in the last 8760 hours.  Assessment and Plan:  Nephrotic range proteinuria Hypercalcemia Scheduled for random renal biopsy per Dr Katheren Puller Risks and benefits of random renal bx was discussed with the patient and/or patient's family including, but not limited to bleeding, infection, damage to adjacent structures or low yield requiring additional tests.  All of the questions were answered and there is agreement to proceed. Consent signed and in  chart.   Thank you for this interesting consult.  I greatly enjoyed meeting BARRI NEIDLINGER and look forward to participating in their care.  A copy of this report was sent to the requesting provider on this date.  Electronically Signed: Lavonia Drafts, PA-Pratt 10/09/2019, 8:45 AM   I spent a total of    25 Minutes in face to face in clinical consultation, greater than 50% of which was counseling/coordinating care for random renal bx

## 2019-10-09 NOTE — Discharge Instructions (Signed)
Percutaneous Kidney Biopsy, Care After This sheet gives you information about how to care for yourself after your procedure. Your health care provider may also give you more specific instructions. If you have problems or questions, contact your health care provider. What can I expect after the procedure? After the procedure, it is common to have:  Pain or soreness near the biopsy site.  Pink or cloudy urine for 24 hours after the procedure. Follow these instructions at home: Activity  Return to your normal activities as told by your health care provider. Ask your health care provider what activities are safe for you.  If you were given a sedative during the procedure, it can affect you for several hours. Do not drive or operate machinery until your health care provider says that it is safe.  Do not lift anything that is heavier than 10 lb (4.5 kg), or the limit that you are told, until your health care provider says that it is safe.  Avoid activities that take a lot of effort (are strenuous) until your health care provider approves. Most people will have to wait 2 weeks before returning to activities such as exercise or sex. General instructions   Take over-the-counter and prescription medicines only as told by your health care provider.  You may eat and drink after your procedure. Follow instructions from your health care provider about eating or drinking restrictions.  Check your biopsy site every day for signs of infection. Check for: ? More redness, swelling, or pain. ? Fluid or blood. ? Warmth. ? Pus or a bad smell.  Keep all follow-up visits as told by your health care provider. This is important. Contact a health care provider if:  You have more redness, swelling, or pain around your biopsy site.  You have fluid or blood coming from your biopsy site.  Your biopsy site feels warm to the touch.  You have pus or a bad smell coming from your biopsy site.  You have blood  in your urine more than 24 hours after your procedure.  You have a fever. Get help right away if:  Your urine is dark red or brown.  You cannot urinate.  It burns when you urinate.  You feel dizzy or light-headed.  You have severe pain in your abdomen or side. Summary  After the procedure, it is common to have pain or soreness at the biopsy site and pink or cloudy urine for the first 24 hours.  Check your biopsy site each day for signs of infection, such as more redness, swelling, or pain; fluid, blood, pus or a bad smell coming from the biopsy site; or the biopsy site feeling warm to touch.  Return to your normal activities as told by your health care provider. This information is not intended to replace advice given to you by your health care provider. Make sure you discuss any questions you have with your health care provider. Document Revised: 11/29/2018 Document Reviewed: 11/29/2018 Elsevier Patient Education  2020 [employer]  

## 2019-10-09 NOTE — Progress Notes (Signed)
HEMATOLOGY/ONCOLOGY CLINIC NOTE  Date of Service: 10/11/2019  Patient Care Team: [name], MD as PCP - General (Family Medicine) [name], MD as PCP - Cardiology (Cardiology) Cassandria Anger, MD as Consulting Physician (Endocrinology) [name], DPM as Consulting Physician (Podiatry) [name], MD as Consulting Physician (Pulmonary Disease) [name], MD as Consulting Physician (Ophthalmology) [name], [facility] as Pharmacist (Pharmacist)  REFERRING PHYSICIAN: [name], MD  CHIEF COMPLAINTS/PURPOSE OF CONSULTATION:  Elevated free light chain ratio, high calcium, proteinuria, abnormal SPEP and renal dysfunction    HISTORY OF PRESENTING ILLNESS:   [name] is a wonderful 69 y.o. female who has been referred to [place] by [name], MD for evaluation and management of elevated free light chain ratio, high calcium, proteinuria, abnormal SPEP and renal dysfunction. Pt is joined by her daughter. The patient's last visit with [place] was on 09/19/19. The pt reports that she is doing well overall.  The pt reports she is good. Pt had her thyroid taken out years ago and her throat was hurting. They found a nodule and they had not taken it out. She always stays cold in her hands and feet.   Of note since the patient's last visit, pt has had CT Biopsy ([id number]) completed on 10/02/19 with results revealing "CT guided bone marrow aspiration and core biopsy." Pt had CT Bone Marrow Biopsy and Aspiration ([id number]) completed on 10/02/19 with results revealing "CT guided bone marrow aspiration and core biopsy." Pt had Surgical Pathology ([id number]) completed on 10/02/19 with results revealing "No monoclonal B-cell population or abnormal T-cell phenotype  identified. "  Lab results today (09/19/19) of CBC w/diff and CMP is as follows: all values are WNL except for MCHC at 29.6, CO2 at 20, Glucose at 144, BUN at 36,  Creatinine at 1.64, Calcium at 10.6, Albumin at 3.2, GFR, Est Non Af Am at 31, GFR, Est AFR Am at 36 09/19/19 of 24-Hr Ur UPEP/UIFE/Light Chains/TP is as follows: all values are WNL except for Total Protein, Urine-Ur/day at 2409 09/19/19 of PTH, intact and calcium  is as follows: all values are WNL except for PTH at 77, Calcium at 10.5 09/19/19 of Beta 2 Microglobulin at 3.7 09/19/19 of LDH at 213 09/19/19 of Sedimentation Rate at 39 09/19/19 of Kappa/Lambda Light Chains is as follows: all values are WNL except for Kappa Free Light Chains at 6.9, Kappa, Lamda light chain ration at 2.31 09/19/19 of MMP  is as follows: all values are WNL except for IgA at 471, B-Globulin SerPl Elph-Mcnc at 1.4 10/09/19 of Protime-INR is as follows: all values are WNL  On review of systems, pt reports soar trapezius and denies any other symptoms.   MEDICAL HISTORY:  Past Medical History:  Diagnosis Date  . Anginal pain ([place]) 03/19/2012   saw Dr. Cathie Olden .. she thinks its more related to stomach issues  . Arthritis    "all over; mostly in my back" (03/20/2012)  . Breast cancer ([place])    "right" (03/20/2012)  . Cardiomyopathy   . Chest pain 06/2005   Hospitalized, dystolic dysfunction  . Chronic lower back pain    "have 2 herniated discs; going to have to have a fusion" (03/20/2012)  . CKD (chronic kidney disease), stage III   . Family history of anesthesia complication    "father has nausea and vomiting"  . Gallstones   . GERD (gastroesophageal reflux disease)   . H/O hiatal hernia   .  History of kidney stones   . History of right mastectomy   . Hypertension    "patient states never had HTN, takes Hyzaar for heart  . Hypothyroidism   . Irritable bowel syndrome   . Migraines    "it's been a long time since I've had one" (03/20/2012)  . Osteoarthritis   . Peripheral neuropathy   . PONV (postoperative nausea and vomiting)   . Sciatica   . Type II diabetes mellitus ([place])      SURGICAL  HISTORY: Past Surgical History:  Procedure Laterality Date  . ABDOMINAL HYSTERECTOMY  1993  . adenosin cardiolite  07/2005   (+) wall motion abnormality  . AXILLARY LYMPH NODE DISSECTION  2003   squamous cell cancer  . BACK SURGERY    . CARDIAC CATHETERIZATION  ? 2005 / 2007  . CHOLECYSTECTOMY N/A 01/24/2018   Procedure: LAPAROSCOPIC CHOLECYSTECTOMY;  Surgeon: Erroll Luna, MD;  Location: [place];  Service: General;  Laterality: N/A;  . CT abdomen and pelvis  02/2010   Same  . ESOPHAGOGASTRODUODENOSCOPY  2005   GERD  . KNEE ARTHROSCOPY  1990's   "right" (03/20/2012)  . LUMBAR FUSION N/A 08/22/2014   Procedure: Right L2-3 and right L1-2 transforaminal lumbar interbody fusion with pedicle screws, rods, sleeves, cages, local bone graft, Vivigen, cancellous chips;  Surgeon: Jessy Oto, MD;  Location: [place];  Service: Orthopedics;  Laterality: N/A;  . LUMBAR LAMINECTOMY/DECOMPRESSION MICRODISCECTOMY N/A 02/04/2014   Procedure: RIGHT L2-3 MICRODISCECTOMY ;  Surgeon: Jessy Oto, MD;  Location: [place];  Service: Orthopedics;  Laterality: N/A;  . MASTECTOMY Right   . MASTECTOMY WITH AXILLARY LYMPH NODE DISSECTION  12/2003   "right" (03/20/2012)  . POSTERIOR CERVICAL FUSION/FORAMINOTOMY  2001  . SHOULDER ARTHROSCOPY W/ ROTATOR CUFF REPAIR  2003   Left  . THYROIDECTOMY  1977   Right  . TOTAL KNEE ARTHROPLASTY  2000   Right  . [place] of abdomen  07/2005   Fatty liver / kidney stones     SOCIAL HISTORY: Social History   Socioeconomic History  . Marital status: Married    Spouse name: Not on file  . Number of children: 1  . Years of education: Not on file  . Highest education level: Not on file  Occupational History  . Occupation: Retired since 2003  Tobacco Use  . Smoking status: Never Smoker  . Smokeless tobacco: Never Used  Vaping Use  . Vaping Use: Never used  Substance and Sexual Activity  . Alcohol use: No  . Drug use: No  . Sexual activity: Not on file  Other Topics  Concern  . Not on file  Social History Narrative  . Not on file   Social Determinants of Health   Financial Resource Strain:   . Difficulty of Paying Living Expenses:   Food Insecurity: No Food Insecurity  . Worried About Charity fundraiser in the Last Year: Never true  . Ran Out of Food in the Last Year: Never true  Transportation Needs: No Transportation Needs  . Lack of Transportation (Medical): No  . Lack of Transportation (Non-Medical): No  Physical Activity:   . Days of Exercise per Week:   . Minutes of Exercise per Session:   Stress:   . Feeling of Stress :   Social Connections:   . Frequency of Communication with Friends and Family:   . Frequency of Social Gatherings with Friends and Family:   . Attends Religious Services:   . Active  Member of Clubs or Organizations:   . Attends Archivist Meetings:   [name] Kitchen Marital Status:   Intimate Partner Violence:   . Fear of Current or Ex-Partner:   . Emotionally Abused:   [name] Kitchen Physically Abused:   . Sexually Abused:      FAMILY HISTORY: Family History  Problem Relation Age of Onset  . Hypertension Mother   . Diabetes Father   . Hypertension Father   . Hypertension Sister   . Hypertension Brother   . Hypertension Brother   . Diabetes Brother   . Diabetes Brother   . Diabetes Other        Family Hx. of Diabetes  . Hypertension Other        Family History of HTN  . Deep vein thrombosis Daughter   . Breast cancer Maternal Aunt   . Colon cancer Neg Hx   . Esophageal cancer Neg Hx   . Liver cancer Neg Hx   . Rectal cancer Neg Hx   . Stomach cancer Neg Hx      ALLERGIES:   is allergic to dilaudid [hydromorphone hcl], lipitor [atorvastatin], adhesive [tape], ampicillin, latex, and penicillins.   MEDICATIONS:  Current Outpatient Medications  Medication Sig Dispense Refill  . acetaminophen (TYLENOL) 500 MG tablet Take 1,000 mg by mouth every 6 (six) hours as needed for moderate pain or headache.    .  albuterol (VENTOLIN HFA) 108 (90 Base) MCG/ACT inhaler Inhale 2 puffs into the lungs every 6 (six) hours as needed for wheezing or shortness of breath. (Patient not taking: Reported on 10/04/2019) 8 g 0  . Alcohol Swabs (B-D SINGLE USE SWABS REGULAR) PADS Use as directed. 100 each 1  . amLODipine (NORVASC) 10 MG tablet TAKE 1 TABLET BY MOUTH EVERY DAY (Patient taking differently: Take 10 mg by mouth daily. ) 90 tablet 3  . aspirin 81 MG tablet Take 81 mg by mouth daily.    . Blood Glucose Calibration (TRUE METRIX LEVEL 2) Normal SOLN Use as directed 3 each 3  . Blood Glucose Monitoring Suppl (TRUE METRIX AIR GLUCOSE METER) w/Device KIT 1 kit by Does not apply route 3 (three) times daily. 1 kit 0  . carvedilol (COREG) 6.25 MG tablet Take 1 tablet (6.25 mg total) by mouth 2 (two) times daily with a meal. 180 tablet 1  . clotrimazole-betamethasone (LOTRISONE) cream Apply 1 application topically 2 (two) times daily. (Patient taking differently: Apply 1 application topically 2 (two) times daily as needed (irritation). ) 90 g 1  . Continuous Blood Gluc Sensor (FREESTYLE LIBRE 14 DAY SENSOR) MISC Inject 1 each into the skin every 14 (fourteen) days. Use as directed. (Patient not taking: Reported on 10/04/2019) 2 each 2  . diclofenac Sodium (VOLTAREN) 1 % GEL Apply 4 g topically 4 (four) times daily as needed. (Patient taking differently: Apply 4 g topically 4 (four) times daily as needed (pain). ) 100 g 0  . ferrous gluconate (FERGON) 324 MG tablet Take 1 tablet (324 mg total) by mouth 2 (two) times daily with a meal. 60 tablet 3  . fluticasone (FLONASE) 50 MCG/ACT nasal spray Place 2 sprays into both nostrils daily. (Patient taking differently: Place 2 sprays into both nostrils daily as needed for allergies. ) 48 g 3  . furosemide (LASIX) 40 MG tablet TAKE 1 TABLET (40 MG TOTAL) BY MOUTH DAILY AS NEEDED (LEG SWELLING). 90 tablet 1  . glipiZIDE (GLUCOTROL XL) 5 MG 24 hr tablet TAKE 1 TABLET BY MOUTH  EVERY DAY  WITH BREAKFAST (Patient taking differently: Take 5 mg by mouth daily with breakfast. ) 90 tablet 0  . glucose blood (TRUE METRIX BLOOD GLUCOSE TEST) test strip Use as instructed 100 each 12  . hydrALAZINE (APRESOLINE) 25 MG tablet Take 25 mg by mouth in the morning, at noon, and at bedtime.    . insulin NPH-regular Human (70-30) 100 UNIT/ML injection Inject 60 Units into the skin See admin instructions. 60 units with breakfast and 60 units with supper for BG above 90    . levothyroxine (SYNTHROID) 100 MCG tablet Take 1 tablet (100 mcg total) by mouth daily. 90 tablet 1  . losartan (COZAAR) 50 MG tablet Take 50 mg by mouth daily.    . metoCLOPramide (REGLAN) 10 MG tablet Take 1 tablet (10 mg total) by mouth 4 (four) times daily. (Patient taking differently: Take 10 mg by mouth every 6 (six) hours as needed for nausea or vomiting. ) 120 tablet 1  . pantoprazole (PROTONIX) 40 MG tablet Take 1 tablet (40 mg total) by mouth 2 (two) times daily. (Patient taking differently: Take 40 mg by mouth 2 (two) times daily as needed (acid reflux). ) 180 tablet 1  . polyethylene glycol (MIRALAX / GLYCOLAX) packet Take 17 g by mouth daily as needed for moderate constipation.    . pregabalin (LYRICA) 75 MG capsule TAKE 1 CAPSULE BY MOUTH TWICE A DAY (Patient taking differently: Take 75 mg by mouth 2 (two) times daily. ) 60 capsule 3  . rosuvastatin (CRESTOR) 20 MG tablet Take 1 tablet (20 mg total) by mouth daily at 6 PM. 90 tablet 1  . TRUEplus Lancets 33G MISC Use as directed 300 each 3   Current Facility-Administered Medications  Medication Dose Route Frequency Provider Last Rate Last Admin  . 0.9 %  sodium chloride infusion  500 mL Intravenous Once Irene Shipper, MD         REVIEW OF SYSTEMS:   A 10+ POINT REVIEW OF SYSTEMS WAS OBTAINED including neurology, dermatology, psychiatry, cardiac, respiratory, lymph, extremities, GI, GU, Musculoskeletal, constitutional, breasts, reproductive, HEENT.  All pertinent  positives are noted in the HPI.  All others are negative.   PHYSICAL EXAMINATION: ECOG PERFORMANCE STATUS: 2 - Symptomatic, <50% confined to bed  Vitals:   10/11/19 1053  BP: 132/68  Pulse: (!) 55  Resp: 18  Temp: 97.6 F (36.4 C)  SpO2: 98%   Filed Weights   10/11/19 1053  Weight: 211 lb (95.7 kg)   Body mass index is 36.22 kg/m.  GENERAL:alert, in no acute distress and comfortable SKIN: no acute rashes, no significant lesions EYES: conjunctiva are pink and non-injected, sclera anicteric OROPHARYNX: MMM, no exudates, no oropharyngeal erythema or ulceration NECK: supple, no JVD LYMPH:  no palpable lymphadenopathy in the cervical, axillary or inguinal regions LUNGS: clear to auscultation b/l with normal respiratory effort HEART: regular rate & rhythm ABDOMEN:  normoactive bowel sounds , non tender, not distended. Extremity: no pedal edema PSYCH: alert & oriented x 3 with fluent speech NEURO: no focal motor/sensory deficits  LABORATORY DATA:  I have reviewed the data as listed  CBC Latest Ref Rng & Units 10/09/2019 10/02/2019 09/19/2019  WBC 4.0 - 10.5 K/uL 8.7 7.9 7.2  Hemoglobin 12.0 - 15.0 g/dL 11.8(L) 12.8 12.9  Hematocrit 36 - 46 % 38.6 43.2 40.7  Platelets 150 - 400 K/uL 290 254 251    CMP Latest Ref Rng & Units 09/19/2019 09/19/2019 08/05/2019  Glucose 70 - 99  mg/dL 144(H) - -  BUN 8 - 23 mg/dL 36(H) - -  Creatinine 0.44 - 1.00 mg/dL 1.64(H) - -  Sodium 135 - 145 mmol/L 137 - -  Potassium 3.5 - 5.1 mmol/L 4.9 - -  Chloride 98 - 111 mmol/L 110 - -  CO2 22 - 32 mmol/L 20(L) - -  Calcium 8.7 - 10.3 mg/dL 10.6(H) 10.5(H) -  Total Protein 6.5 - 8.1 g/dL 7.3 - 6.6  Total Bilirubin 0.3 - 1.2 mg/dL 0.3 - 0.3  Alkaline Phos 38 - 126 U/L 65 - -  AST 15 - 41 U/L 25 - 16  ALT 0 - 44 U/L 21 - 11   Component     Latest Ref Rng & Units 09/19/2019 09/24/2019  IgG (Immunoglobin G), Serum     586 - 1,602 mg/dL 1,202   IgA     87 - 352 mg/dL 471 (H)   IgM (Immunoglobulin  M), Srm     26 - 217 mg/dL 112   Total Protein ELP     6.0 - 8.5 g/dL 6.9   Albumin SerPl Elph-Mcnc     2.9 - 4.4 g/dL 3.2   Alpha 1     0.0 - 0.4 g/dL 0.2   Alpha2 Glob SerPl Elph-Mcnc     0.4 - 1.0 g/dL 1.0   B-Globulin SerPl Elph-Mcnc     0.7 - 1.3 g/dL 1.4 (H)   Gamma Glob SerPl Elph-Mcnc     0.4 - 1.8 g/dL 1.1   M Protein SerPl Elph-Mcnc     Not Observed g/dL Not Observed   Globulin, Total     2.2 - 3.9 g/dL 3.7   Albumin/Glob SerPl     0.7 - 1.7 0.9   IFE 1      Comment (A)   Please Note (HCV):      Comment   Total Protein, Urine-UPE24     Not Estab. mg/dL  141.7  Total Protein, Urine-Ur/day     30 - 150 mg/24 hr  2,409 (H)  ALBUMIN, U     %  80.3  ALPHA 1 URINE     %  1.6  Alpha 2, Urine     %  3.6  % BETA, Urine     %  8.4  GAMMA GLOBULIN URINE     %  6.1  Free Kappa Lt Chains,Ur     0.63 - 113.79 mg/L  30.64  Free Lambda Lt Chains,Ur     0.47 - 11.77 mg/L  6.97  Free Kappa/Lambda Ratio     1.03 - 31.76  4.40  Immunofixation Result, Urine       Comment  Total Volume       1,700  M-SPIKE %, Urine     Not Observed %  Not Observed  NOTE:       Comment  PTH, Intact     15 - 65 pg/mL 77 (H)   Calcium, Total (PTH)     8.7 - 10.3 mg/dL 10.5 (H)   PTH Interp      Comment   Kappa free light chain     3.3 - 19.4 mg/L 56.9 (H)   Lamda free light chains     5.7 - 26.3 mg/L 24.6   Kappa, lamda light chain ratio     0.26 - 1.65 2.31 (H)   Beta-2 Microglobulin     0.6 - 2.4 mg/L 3.7 (H)   LDH     98 - 192  U/L 213 (H)   Sed Rate     0 - 22 mm/hr 39 (H)       SURGICAL PATHOLOGY  CASE: [id number]  PATIENT: Aleeyah Corriveau  Bone Marrow Report   DIAGNOSIS:   BONE MARROW, ASPIRATE, CLOT, CORE:  -Slightly hypercellular bone marrow for age with trilineage  hematopoiesis  -Slight plasmacytosis  -See comment   PERIPHERAL BLOOD:  -No significant morphologic abnormalities   COMMENT:   The bone marrow is slightly hypercellular for age with  trilineage  hematopoiesis and nonspecific changes. The plasma cells represent 4% of  all cells in the aspirate with lack of large aggregates or sheets and  display polyclonal staining pattern for kappa and lambda light chains.  Hence, there is no diagnostic evidence of a plasma cell neoplasm.  Additionally, there is no evidence of a lymphoproliferative process.  Correlation with cytogenetic studies is recommended.     RADIOGRAPHIC STUDIES: I have personally reviewed the radiological images as listed and agreed with the findings in the report. US RENAL  Result Date: 09/14/2019 CLINICAL DATA:  Initial evaluation for chronic kidney disease, stage G3b/A1. EXAM: RENAL / URINARY TRACT ULTRASOUND COMPLETE COMPARISON:  Prior CT from 12/25/2017. FINDINGS: Right Kidney: Renal measurements: 11.7 x 6.3 x 6.0 cm = volume: 234.8 mL. Increased echogenicity within the renal parenchyma. No nephrolithiasis or hydronephrosis. No focal renal mass. Left Kidney: Renal measurements: 10.6 x 6.1 x 5.3 cm = volume: 178.1 mL. Diffusely increased echogenicity within the renal parenchyma. No nephrolithiasis or hydronephrosis. 1.2 x 1.1 x 1.0 cm complex cyst with internal calcification present at the mid-upper pole, stable from prior CT, consistent with a benign finding. Bladder: Appears normal for degree of bladder distention. Other: None. IMPRESSION: 1. Increased echogenicity within the renal parenchyma, consistent with medical renal disease. 2. No hydronephrosis. 3. 1.2 cm complex cyst with internal calcification at the interpolar left kidney, stable from previous. Electronically Signed   By: Jeannine Boga M.D.   On: 09/14/2019 08:46   CT Biopsy  Result Date: 10/02/2019 INDICATION: 69 year old with monoclonal paraproteinemia. EXAM: CT GUIDED BONE MARROW ASPIRATES AND BIOPSY Physician: Stephan Minister. Anselm Pancoast, MD MEDICATIONS: None. ANESTHESIA/SEDATION: Fentanyl 100 mcg IV; Versed 2.0 mg IV Moderate Sedation Time:  10 minutes The  patient was continuously monitored during the procedure by the interventional radiology nurse under my direct supervision. COMPLICATIONS: None immediate. PROCEDURE: The procedure was explained to the patient. The risks and benefits of the procedure were discussed and the patient's questions were addressed. Informed consent was obtained from the patient. The patient was placed prone on CT table. Images of the pelvis were obtained. The right side of back was prepped and draped in sterile fashion. The skin and right posterior ilium were anesthetized with 1% lidocaine. 11 gauge bone needle was directed into the right ilium with CT guidance. Two aspirates and one core biopsy were obtained. Bandage placed over the puncture site. IMPRESSION: CT guided bone marrow aspiration and core biopsy. Electronically Signed   By: Markus Daft M.D.   On: 10/02/2019 10:07   DG Bone Survey Met  Result Date: 09/26/2019 CLINICAL DATA:  Monoclonal paraproteinemia. EXAM: METASTATIC BONE SURVEY COMPARISON:  Chest x-ray 04/19/2019. CT chest 04/15/2019. Lumbar spine 11/23/2018. CT abdomen 12/25/2017. FINDINGS: Standard imaging of the axial and appendicular skeleton performed. Surgical clips noted over the chest and abdomen. No acute cardiopulmonary disease or acute intra-abdominal abnormality identified. Diffuse osteopenia. No lytic or sclerotic lesions are noted to suggest myeloma or metastatic disease. Diffuse degenerative change  noted about the axial and appendicular skeleton. Prior cervical and lumbar spine fusion. Prior right knee replacement. Postsurgical changes right ankle. Aortic and peripheral vascular calcification. IMPRESSION: No evidence of lytic or sclerotic lesions to suggest myeloma or metastatic disease. Electronically Signed   By: Marcello Moores  Register   On: 09/26/2019 16:15   CT BONE MARROW BIOPSY & ASPIRATION  Result Date: 10/02/2019 INDICATION: 69 year old with monoclonal paraproteinemia. EXAM: CT GUIDED BONE MARROW  ASPIRATES AND BIOPSY Physician: Stephan Minister. Anselm Pancoast, MD MEDICATIONS: None. ANESTHESIA/SEDATION: Fentanyl 100 mcg IV; Versed 2.0 mg IV Moderate Sedation Time:  10 minutes The patient was continuously monitored during the procedure by the interventional radiology nurse under my direct supervision. COMPLICATIONS: None immediate. PROCEDURE: The procedure was explained to the patient. The risks and benefits of the procedure were discussed and the patient's questions were addressed. Informed consent was obtained from the patient. The patient was placed prone on CT table. Images of the pelvis were obtained. The right side of back was prepped and draped in sterile fashion. The skin and right posterior ilium were anesthetized with 1% lidocaine. 11 gauge bone needle was directed into the right ilium with CT guidance. Two aspirates and one core biopsy were obtained. Bandage placed over the puncture site. IMPRESSION: CT guided bone marrow aspiration and core biopsy. Electronically Signed   By: Markus Daft M.D.   On: 10/02/2019 10:07   US BIOPSY (KIDNEY)  Result Date: 10/09/2019 INDICATION: Nephrotic range proteinuria. Please perform random renal biopsy for tissue diagnostic purposes. EXAM: ULTRASOUND GUIDED RENAL BIOPSY COMPARISON:  None. MEDICATIONS: None. ANESTHESIA/SEDATION: Fentanyl 50 mcg IV; Versed 1 mg IV Total Moderate Sedation time: 10 minutes; The patient was continuously monitored during the procedure by the interventional radiology nurse under my direct supervision. COMPLICATIONS: None immediate. PROCEDURE: Informed written consent was obtained from the patient after a discussion of the risks, benefits and alternatives to treatment. The patient understands and consents the procedure. A timeout was performed prior to the initiation of the procedure. Ultrasound scanning was performed of the bilateral flanks. The inferior pole of the right kidney was selected for biopsy due to location and sonographic window. The procedure  was planned. The operative site was prepped and draped in the usual sterile fashion. The overlying soft tissues were anesthetized with 1% lidocaine with epinephrine. A 17 gauge core needle biopsy device was advanced into the inferior cortex of the right kidney and 3 core biopsies were obtained under direct ultrasound guidance. Images were saved for documentation purposes. The biopsy device was removed and hemostasis was obtained with manual compression. Post procedural scanning was negative for significant post procedural hemorrhage or additional complication. A dressing was placed. The patient tolerated the procedure well without immediate post procedural complication. IMPRESSION: Technically successful ultrasound guided right renal biopsy. Electronically Signed   By: Sandi Mariscal M.D.   On: 10/09/2019 10:56     ASSESSMENT & PLAN:  [name] is a 69 y.o. female with:  1. Monoclonal Paraproteinemia - Elevated kappa light chains. R/o plasma cell dyscrasia. 2. hypercalcemia 3. CKD with possibly nephrotic range proteinuria  PLAN: -Discussed pt labwork today, 09/19/19;  of CBC w/diff and CMP is as follows: all values are WNL except for MCHC at 29.6, CO2 at 20, Glucose at 144, BUN at 36, Creatinine at 1.64, Calcium at 10.6, Albumin at 3.2, GFR, Est Non Af Am at 31, GFR, Est AFR Am at 36 -Discussed 09/19/19 of 24-Hr Ur UPEP/UIFE/Light Chains/TP is as follows: all values are WNL  except for Total Protein, Urine-Ur/day at 2409 -Discussed 09/19/19 of PTH, intact and calcium  is as follows: all values are WNL except for PTH at 77, Calcium at 10.5 -Discussed 09/19/19 of Beta 2 Microglobulin at 3.7 -Discussed 09/19/19 of LDH at 213 -Discussed 09/19/19 of Sedimentation Rate at 39 -Discussed 09/19/19 of Kappa/Lambda Light Chains is as follows: all values are WNL except for Kappa Free Light Chains at 6.9, Kappa, Lamda light chain ration at 2.31 -Discussed 09/19/19 of MMP  is as follows: all values are WNL  except for IgA at 471, B-Globulin SerPl Elph-Mcnc at 1.4, no M spike. -Discussed 10/09/19 of Protime-INR is as follows: all values are WNL -Discussed 10/02/19 of CT Biopsy ([id number])  -Discussed 10/02/19 of CT Bone Marrow Biopsy and Aspiration ([id number]) -Discussed 10/02/19 of Surgical Pathology ([id number])  -Advised on multiple myeloma vs AL Amyloidosis (patient does not have either of these)  -Advised on CRAB criteria -High Calcium (pt had), Renal failure (pt had), Anemia (pt does not have), Bone Tumors (pt does not have) -Advised on plasma cell disorder and associated w/u to rule these out. -Advised pt has no concern for myeloma -no evidence for plasma cell cancer, no primary bone marrow problem  -Advised of protein in urine -advised on leaky kidney- no monoclonal protein in blood or urine. -Advised pt has elevated calcium levels- pt has PTH increased -can weaken bone over time  -Advised PTH can be risk factor for osteoporosis  -Advised leaky kidneys are likely due to diabetes -treatment would be optimizing diabetes treatment, medications  -Recommends PTH be evaluated by endocrinologist  - will defer to PCP. -Recommends continue taking OTC oral iron supplement  -Recommends continue to f/u on PCP and nephrologist  -Recommends getting kidney biopsy with nephrologist -Will see back as needed   FOLLOW UP RTC with Dr Irene Limbo as needed  . No orders of the defined types were placed in this encounter.   The total time spent in the appt was 30 minutes and more than 50% was on counseling and direct patient cares.  All of the patient's questions were answered with apparent satisfaction. The patient knows to call the clinic with any problems, questions or concerns.  Sullivan Lone MD MS AAHIVMS [facility] Hematology/Oncology Physician [facility]  (Office):       [phone] (Work cell):  [phone] (Fax):           [phone]  10/11/2019 12:13 PM  I, Dawayne Cirri am  acting as a Education administrator for Dr. Sullivan Lone.   .I have reviewed the above documentation for accuracy and completeness, and I agree with the above. Brunetta Genera MD

## 2019-10-09 NOTE — Progress Notes (Signed)
Discharge instructions reviewed with pt and her husband (via telephone) both voice understanding.  

## 2019-10-09 NOTE — Progress Notes (Signed)
On admission, pt states that she has been very shaky at home and drank a coke. She states she did take her full dose of insulin last night at 2200. Upon further review, pt states she took 40 units not 60. Pt's CBG is 68 and she is feeling shaky.  Pam Turpin,PA notified. Pt given 4oz apple juice per protocol and IV attempted by 2 RNs. IV consult requested stat. Pt's repeat CBG is 58 and she is feeling shaky. VSS Glucose tablets 4 G given x2.

## 2019-10-09 NOTE — Progress Notes (Signed)
Pt has had 2 more glucose tablets. IVT has gotten access. Pt's CBG is 81

## 2019-10-09 NOTE — Progress Notes (Signed)
Pt states she has to go to the bathroom. Instructed that she is on bedrest and the Dr does not want her up out of bed yet. Pt then stands up and states Im going to the bathroom. Again explained that she is on bedrest. She states hand me my cane I'm going to the bathroom. Pt ambulated to the bathroom with her cane.

## 2019-10-09 NOTE — Progress Notes (Signed)
r flank area with no bleeding or swelling after ambulation to the bathroom

## 2019-10-11 ENCOUNTER — Inpatient Hospital Stay: Payer: Medicare HMO | Attending: Hematology | Admitting: Hematology

## 2019-10-11 ENCOUNTER — Other Ambulatory Visit: Payer: Self-pay

## 2019-10-11 VITALS — BP 132/68 | HR 55 | Temp 97.6°F | Resp 18 | Ht 64.0 in | Wt 211.0 lb

## 2019-10-11 DIAGNOSIS — K219 Gastro-esophageal reflux disease without esophagitis: Secondary | ICD-10-CM | POA: Diagnosis not present

## 2019-10-11 DIAGNOSIS — Z7984 Long term (current) use of oral hypoglycemic drugs: Secondary | ICD-10-CM | POA: Diagnosis not present

## 2019-10-11 DIAGNOSIS — M129 Arthropathy, unspecified: Secondary | ICD-10-CM | POA: Diagnosis not present

## 2019-10-11 DIAGNOSIS — I509 Heart failure, unspecified: Secondary | ICD-10-CM | POA: Insufficient documentation

## 2019-10-11 DIAGNOSIS — G629 Polyneuropathy, unspecified: Secondary | ICD-10-CM | POA: Diagnosis not present

## 2019-10-11 DIAGNOSIS — E119 Type 2 diabetes mellitus without complications: Secondary | ICD-10-CM | POA: Diagnosis not present

## 2019-10-11 DIAGNOSIS — D472 Monoclonal gammopathy: Secondary | ICD-10-CM | POA: Insufficient documentation

## 2019-10-11 DIAGNOSIS — Z79899 Other long term (current) drug therapy: Secondary | ICD-10-CM | POA: Diagnosis not present

## 2019-10-11 DIAGNOSIS — K589 Irritable bowel syndrome without diarrhea: Secondary | ICD-10-CM | POA: Diagnosis not present

## 2019-10-11 DIAGNOSIS — D72822 Plasmacytosis: Secondary | ICD-10-CM | POA: Diagnosis not present

## 2019-10-11 DIAGNOSIS — R809 Proteinuria, unspecified: Secondary | ICD-10-CM

## 2019-10-11 DIAGNOSIS — Z803 Family history of malignant neoplasm of breast: Secondary | ICD-10-CM | POA: Diagnosis not present

## 2019-10-11 DIAGNOSIS — I129 Hypertensive chronic kidney disease with stage 1 through stage 4 chronic kidney disease, or unspecified chronic kidney disease: Secondary | ICD-10-CM | POA: Diagnosis not present

## 2019-10-11 DIAGNOSIS — N183 Chronic kidney disease, stage 3 unspecified: Secondary | ICD-10-CM | POA: Diagnosis not present

## 2019-10-11 DIAGNOSIS — M545 Low back pain: Secondary | ICD-10-CM | POA: Diagnosis not present

## 2019-10-11 DIAGNOSIS — E039 Hypothyroidism, unspecified: Secondary | ICD-10-CM | POA: Insufficient documentation

## 2019-10-15 ENCOUNTER — Encounter (HOSPITAL_COMMUNITY): Payer: Self-pay | Admitting: Hematology

## 2019-10-15 ENCOUNTER — Other Ambulatory Visit: Payer: Self-pay | Admitting: Critical Care Medicine

## 2019-10-18 ENCOUNTER — Other Ambulatory Visit: Payer: Self-pay | Admitting: Nephrology

## 2019-10-18 ENCOUNTER — Other Ambulatory Visit: Payer: Self-pay

## 2019-10-18 ENCOUNTER — Encounter (HOSPITAL_COMMUNITY): Payer: Self-pay

## 2019-10-18 ENCOUNTER — Other Ambulatory Visit: Payer: Self-pay | Admitting: Family Medicine

## 2019-10-18 DIAGNOSIS — Z9012 Acquired absence of left breast and nipple: Secondary | ICD-10-CM | POA: Diagnosis not present

## 2019-10-18 DIAGNOSIS — E1159 Type 2 diabetes mellitus with other circulatory complications: Secondary | ICD-10-CM | POA: Diagnosis not present

## 2019-10-18 DIAGNOSIS — Z794 Long term (current) use of insulin: Secondary | ICD-10-CM | POA: Insufficient documentation

## 2019-10-18 DIAGNOSIS — I129 Hypertensive chronic kidney disease with stage 1 through stage 4 chronic kidney disease, or unspecified chronic kidney disease: Secondary | ICD-10-CM | POA: Diagnosis not present

## 2019-10-18 DIAGNOSIS — N183 Chronic kidney disease, stage 3 unspecified: Secondary | ICD-10-CM | POA: Diagnosis not present

## 2019-10-18 DIAGNOSIS — Z1231 Encounter for screening mammogram for malignant neoplasm of breast: Secondary | ICD-10-CM

## 2019-10-18 DIAGNOSIS — E1122 Type 2 diabetes mellitus with diabetic chronic kidney disease: Secondary | ICD-10-CM | POA: Diagnosis not present

## 2019-10-18 DIAGNOSIS — Z7982 Long term (current) use of aspirin: Secondary | ICD-10-CM | POA: Insufficient documentation

## 2019-10-18 DIAGNOSIS — Z79890 Hormone replacement therapy (postmenopausal): Secondary | ICD-10-CM | POA: Insufficient documentation

## 2019-10-18 DIAGNOSIS — Z96651 Presence of right artificial knee joint: Secondary | ICD-10-CM | POA: Diagnosis not present

## 2019-10-18 DIAGNOSIS — Z7951 Long term (current) use of inhaled steroids: Secondary | ICD-10-CM | POA: Insufficient documentation

## 2019-10-18 DIAGNOSIS — Z79899 Other long term (current) drug therapy: Secondary | ICD-10-CM | POA: Insufficient documentation

## 2019-10-18 DIAGNOSIS — E1140 Type 2 diabetes mellitus with diabetic neuropathy, unspecified: Secondary | ICD-10-CM | POA: Insufficient documentation

## 2019-10-18 DIAGNOSIS — E039 Hypothyroidism, unspecified: Secondary | ICD-10-CM | POA: Diagnosis not present

## 2019-10-18 DIAGNOSIS — Z853 Personal history of malignant neoplasm of breast: Secondary | ICD-10-CM | POA: Insufficient documentation

## 2019-10-18 DIAGNOSIS — R519 Headache, unspecified: Secondary | ICD-10-CM | POA: Diagnosis not present

## 2019-10-18 DIAGNOSIS — R109 Unspecified abdominal pain: Secondary | ICD-10-CM

## 2019-10-18 NOTE — ED Triage Notes (Signed)
Pt sts high blood pressures in the 503'T systolic and having neck pain radiating up neck and around forehead.

## 2019-10-19 ENCOUNTER — Emergency Department (HOSPITAL_COMMUNITY)
Admission: EM | Admit: 2019-10-19 | Discharge: 2019-10-19 | Disposition: A | Discharge status: Home / Self Care | Payer: Medicare HMO | Attending: Emergency Medicine | Admitting: Emergency Medicine

## 2019-10-19 ENCOUNTER — Emergency Department (HOSPITAL_COMMUNITY): Payer: Medicare HMO

## 2019-10-19 DIAGNOSIS — R519 Headache, unspecified: Secondary | ICD-10-CM

## 2019-10-19 LAB — CBC WITH DIFFERENTIAL/PLATELET
Abs Immature Granulocytes: 0.02 10*3/uL (ref 0.00–0.07)
Basophils Absolute: 0 10*3/uL (ref 0.0–0.1)
Basophils Relative: 1 %
Eosinophils Absolute: 0.3 10*3/uL (ref 0.0–0.5)
Eosinophils Relative: 3 %
HCT: 33 % — ABNORMAL LOW (ref 36.0–46.0)
Hemoglobin: 10.2 g/dL — ABNORMAL LOW (ref 12.0–15.0)
Immature Granulocytes: 0 %
Lymphocytes Relative: 30 %
Lymphs Abs: 2.4 10*3/uL (ref 0.7–4.0)
MCH: 28.9 pg (ref 26.0–34.0)
MCHC: 30.9 g/dL (ref 30.0–36.0)
MCV: 93.5 fL (ref 80.0–100.0)
Monocytes Absolute: 0.8 10*3/uL (ref 0.1–1.0)
Monocytes Relative: 10 %
Neutro Abs: 4.5 10*3/uL (ref 1.7–7.7)
Neutrophils Relative %: 56 %
Platelets: 289 10*3/uL (ref 150–400)
RBC: 3.53 MIL/uL — ABNORMAL LOW (ref 3.87–5.11)
RDW: 14.7 % (ref 11.5–15.5)
WBC: 8 10*3/uL (ref 4.0–10.5)
nRBC: 0 % (ref 0.0–0.2)

## 2019-10-19 LAB — BASIC METABOLIC PANEL
Anion gap: 9 (ref 5–15)
BUN: 46 mg/dL — ABNORMAL HIGH (ref 8–23)
CO2: 24 mmol/L (ref 22–32)
Calcium: 10 mg/dL (ref 8.9–10.3)
Chloride: 106 mmol/L (ref 98–111)
Creatinine, Ser: 2.25 mg/dL — ABNORMAL HIGH (ref 0.44–1.00)
GFR calc Af Amer: 25 mL/min — ABNORMAL LOW (ref >60)
GFR calc non Af Amer: 21 mL/min — ABNORMAL LOW (ref >60)
Glucose, Bld: 109 mg/dL — ABNORMAL HIGH (ref 70–99)
Potassium: 5.1 mmol/L (ref 3.5–5.1)
Sodium: 139 mmol/L (ref 135–145)

## 2019-10-19 MED ORDER — KETOROLAC TROMETHAMINE 30 MG/ML IJ SOLN
30.0000 mg | Freq: Once | INTRAMUSCULAR | Status: AC
  Administered 2019-10-19: 30 mg via INTRAVENOUS
  Filled 2019-10-19: qty 1

## 2019-10-19 MED ORDER — SODIUM CHLORIDE 0.9 % IV BOLUS
1000.0000 mL | Freq: Once | INTRAVENOUS | Status: AC
  Administered 2019-10-19: 1000 mL via INTRAVENOUS

## 2019-10-19 MED ORDER — SODIUM CHLORIDE 0.9 % IV BOLUS
500.0000 mL | Freq: Once | INTRAVENOUS | Status: AC
  Administered 2019-10-19: 500 mL via INTRAVENOUS

## 2019-10-19 MED ORDER — DEXAMETHASONE SODIUM PHOSPHATE 10 MG/ML IJ SOLN
10.0000 mg | Freq: Once | INTRAMUSCULAR | Status: AC
  Administered 2019-10-19: 10 mg via INTRAVENOUS
  Filled 2019-10-19: qty 1

## 2019-10-19 MED ORDER — METOCLOPRAMIDE HCL 5 MG/ML IJ SOLN
10.0000 mg | Freq: Once | INTRAMUSCULAR | Status: AC
  Administered 2019-10-19: 10 mg via INTRAVENOUS
  Filled 2019-10-19: qty 2

## 2019-10-19 MED ORDER — DIPHENHYDRAMINE HCL 50 MG/ML IJ SOLN
25.0000 mg | Freq: Once | INTRAMUSCULAR | Status: AC
  Administered 2019-10-19: 25 mg via INTRAVENOUS
  Filled 2019-10-19: qty 1

## 2019-10-19 NOTE — ED Provider Notes (Signed)
[facility] DEPT Provider Note   CSN: [id number] Arrival date & time: 10/18/19  2053     History Chief Complaint  Patient presents with  . Headache    [name] is a 69 y.o. female.  Patient is a 69 year old female with past medical history of diabetes, chronic renal insufficiency, hypertension, breast cancer.  She presents today for evaluation of headache and elevated blood pressure.  Patient describes a pressure in the front of her head that radiates into the back of her neck.  This has been present for the past 2 days.  She describes blood pressures at home running in the 160s.  She denies any numbness or tingling.  She denies any visual disturbances.  She denies any injury or trauma.  She has been taking over-the-counter medications and muscle relaxers with little relief.  The history is provided by the patient.  Headache Pain location:  Frontal Radiates to:  R neck Onset quality:  Gradual Duration:  2 days Timing:  Constant Progression:  Worsening Chronicity:  New      Past Medical History:  Diagnosis Date  . Anginal pain ([place]) 03/19/2012   saw Dr. Cathie Olden .. she thinks its more related to stomach issues  . Arthritis    "all over; mostly in my back" (03/20/2012)  . Breast cancer ([place])    "right" (03/20/2012)  . Cardiomyopathy   . Chest pain 06/2005   Hospitalized, dystolic dysfunction  . Chronic lower back pain    "have 2 herniated discs; going to have to have a fusion" (03/20/2012)  . CKD (chronic kidney disease), stage III   . Family history of anesthesia complication    "father has nausea and vomiting"  . Gallstones   . GERD (gastroesophageal reflux disease)   . H/O hiatal hernia   . History of kidney stones   . History of right mastectomy   . Hypertension    "patient states never had HTN, takes Hyzaar for heart  . Hypothyroidism   . Irritable bowel syndrome   . Migraines    "it's been a long time since I've had one"  (03/20/2012)  . Osteoarthritis   . Peripheral neuropathy   . PONV (postoperative nausea and vomiting)   . Sciatica   . Type II diabetes mellitus [facility])     Patient Active Problem List   Diagnosis Date Noted  . Physical exam 06/26/2019  . Neck pain 04/18/2019  . Ascending aortic aneurysm ([place]) 04/17/2019  . Hypoxia   . Nonspecific chest pain 04/15/2019  . Neuropathic pain of right flank 12/27/2018  . Dysphagia 12/27/2018  . Postsurgical hypothyroidism 06/11/2018  . Vitamin D deficiency 06/11/2018  . Multinodular goiter 06/11/2018  . Hypercalcemia 06/11/2018  . DM type 2 causing vascular disease ([place])   . PONV (postoperative nausea and vomiting)   . Peripheral neuropathy   . Irritable bowel syndrome   . Hypertension   . History of right mastectomy   . H/O hiatal hernia   . GERD (gastroesophageal reflux disease)   . Family history of anesthesia complication   . Diabetes mellitus type 2, uncontrolled, with complications ([place])   . Lower extremity edema   . Chronic constipation 06/23/2015  . Morbid obesity ([place]) 06/12/2015  . Chronic diastolic heart failure ([place]) 11/17/2014  . Herniated nucleus pulposus, L2-3 right 02/04/2014    Class: Acute  . Spondylolisthesis of lumbar region 04/30/2012    Class: Chronic  . Lumbar stenosis with neurogenic claudication 04/30/2012  Class: Chronic  . Anginal pain ([place]) 03/19/2012  . SYNCOPE-CAROTID SINUS 09/16/2008  . OSTEOARTHRITIS 08/23/2006  . DDD (degenerative disc disease), cervical 08/23/2006  . [place] DISEASE, LUMBAR 08/23/2006  . Headache(784.0) 08/23/2006  . BREAST CANCER, HX OF 08/23/2006  . HYPERPARATHYROIDISM, HX OF 08/23/2006  . RENAL CALCULUS, HX OF 08/23/2006    Past Surgical History:  Procedure Laterality Date  . ABDOMINAL HYSTERECTOMY  1993  . adenosin cardiolite  07/2005   (+) wall motion abnormality  . AXILLARY LYMPH NODE DISSECTION  2003   squamous cell cancer  . BACK SURGERY    . CARDIAC CATHETERIZATION  ?  2005 / 2007  . CHOLECYSTECTOMY N/A 01/24/2018   Procedure: LAPAROSCOPIC CHOLECYSTECTOMY;  Surgeon: Erroll Luna, MD;  Location: [place] [facility];  Service: General;  Laterality: N/A;  . CT abdomen and pelvis  02/2010   Same  . ESOPHAGOGASTRODUODENOSCOPY  2005   GERD  . KNEE ARTHROSCOPY  1990's   "right" (03/20/2012)  . LUMBAR FUSION N/A 08/22/2014   Procedure: Right L2-3 and right L1-2 transforaminal lumbar interbody fusion with pedicle screws, rods, sleeves, cages, local bone graft, Vivigen, cancellous chips;  Surgeon: Jessy Oto, MD;  Location: [place];  Service: Orthopedics;  Laterality: N/A;  . LUMBAR LAMINECTOMY/DECOMPRESSION MICRODISCECTOMY N/A 02/04/2014   Procedure: RIGHT L2-3 MICRODISCECTOMY ;  Surgeon: Jessy Oto, MD;  Location: [place];  Service: Orthopedics;  Laterality: N/A;  . MASTECTOMY Right   . MASTECTOMY WITH AXILLARY LYMPH NODE DISSECTION  12/2003   "right" (03/20/2012)  . POSTERIOR CERVICAL FUSION/FORAMINOTOMY  2001  . SHOULDER ARTHROSCOPY W/ ROTATOR CUFF REPAIR  2003   Left  . THYROIDECTOMY  1977   Right  . TOTAL KNEE ARTHROPLASTY  2000   Right  . [place] of abdomen  07/2005   Fatty liver / kidney stones     OB History   No obstetric history on file.     Family History  Problem Relation Age of Onset  . Hypertension Mother   . Diabetes Father   . Hypertension Father   . Hypertension Sister   . Hypertension Brother   . Hypertension Brother   . Diabetes Brother   . Diabetes Brother   . Diabetes Other        Family Hx. of Diabetes  . Hypertension Other        Family History of HTN  . Deep vein thrombosis Daughter   . Breast cancer Maternal Aunt   . Colon cancer Neg Hx   . Esophageal cancer Neg Hx   . Liver cancer Neg Hx   . Rectal cancer Neg Hx   . Stomach cancer Neg Hx     Social History   Tobacco Use  . Smoking status: Never Smoker  . Smokeless tobacco: Never Used  Vaping Use  . Vaping Use: Never used  Substance Use Topics  . Alcohol use: No  .  Drug use: No    Home Medications Prior to Admission medications   Medication Sig Start Date End Date Taking? Authorizing Provider  acetaminophen (TYLENOL) 500 MG tablet Take 1,000 mg by mouth every 6 (six) hours as needed for moderate pain or headache.    [provider]  albuterol (VENTOLIN HFA) 108 (90 Base) MCG/ACT inhaler Inhale 2 puffs into the lungs every 6 (six) hours as needed for wheezing or shortness of breath. Patient not taking: Reported on 10/04/2019 04/20/19   Oretha Milch D, MD  Alcohol Swabs (B-D SINGLE USE SWABS REGULAR) PADS Use  as directed. 06/14/19   [name], MD  amLODipine (NORVASC) 10 MG tablet TAKE 1 TABLET BY MOUTH EVERY DAY Patient taking differently: Take 10 mg by mouth daily.  03/29/19   Susy Frizzle, MD  aspirin 81 MG tablet Take 81 mg by mouth daily.    [provider]  Blood Glucose Calibration (TRUE METRIX LEVEL 2) Normal SOLN Use as directed 05/17/17   Susy Frizzle, MD  Blood Glucose Monitoring Suppl (TRUE METRIX AIR GLUCOSE METER) w/Device KIT 1 kit by Does not apply route 3 (three) times daily. 06/14/19   [name], MD  carvedilol (COREG) 6.25 MG tablet Take 1 tablet (6.25 mg total) by mouth 2 (two) times daily with a meal. 06/21/19   [name], MD  clotrimazole-betamethasone (LOTRISONE) cream Apply 1 application topically 2 (two) times daily. Patient taking differently: Apply 1 application topically 2 (two) times daily as needed (irritation).  08/15/19   [name], MD  Continuous Blood Gluc Sensor (FREESTYLE LIBRE 14 DAY SENSOR) MISC Inject 1 each into the skin every 14 (fourteen) days. Use as directed. Patient not taking: Reported on 10/04/2019 09/02/19   Cassandria Anger, MD  diclofenac Sodium (VOLTAREN) 1 % GEL Apply 4 g topically 4 (four) times daily as needed. Patient taking differently: Apply 4 g topically 4 (four) times daily as needed (pain).  04/20/19   Oretha Milch D, MD  ferrous  gluconate (FERGON) 324 MG tablet TAKE 1 TABLET (324 MG TOTAL) BY MOUTH 2 (TWO) TIMES DAILY WITH A MEAL. 10/15/19   Noemi Chapel P, DO  fluticasone (FLONASE) 50 MCG/ACT nasal spray Place 2 sprays into both nostrils daily. Patient taking differently: Place 2 sprays into both nostrils daily as needed for allergies.  05/10/17   Susy Frizzle, MD  furosemide (LASIX) 40 MG tablet TAKE 1 TABLET (40 MG TOTAL) BY MOUTH DAILY AS NEEDED (LEG SWELLING). 01/09/19   Susy Frizzle, MD  glipiZIDE (GLUCOTROL XL) 5 MG 24 hr tablet TAKE 1 TABLET BY MOUTH EVERY DAY WITH BREAKFAST Patient taking differently: Take 5 mg by mouth daily with breakfast.  09/24/19   Nida, Marella Chimes, MD  glucose blood (TRUE METRIX BLOOD GLUCOSE TEST) test strip Use as instructed 06/14/19   [name], MD  hydrALAZINE (APRESOLINE) 25 MG tablet Take 25 mg by mouth in the morning, at noon, and at bedtime. 09/17/19   [provider]  insulin NPH-regular Human (70-30) 100 UNIT/ML injection Inject 60 Units into the skin See admin instructions. 60 units with breakfast and 60 units with supper for BG above 90    [provider]  levothyroxine (SYNTHROID) 100 MCG tablet Take 1 tablet (100 mcg total) by mouth daily. 06/21/19   [name], MD  losartan (COZAAR) 50 MG tablet Take 50 mg by mouth daily.    [provider]  metoCLOPramide (REGLAN) 10 MG tablet Take 1 tablet (10 mg total) by mouth 4 (four) times daily. Patient taking differently: Take 10 mg by mouth every 6 (six) hours as needed for nausea or vomiting.  08/21/19   [name], MD  pantoprazole (PROTONIX) 40 MG tablet Take 1 tablet (40 mg total) by mouth 2 (two) times daily. Patient taking differently: Take 40 mg by mouth 2 (two) times daily as needed (acid reflux).  06/21/19   [name], MD  polyethylene glycol [facility] Floria Raveling) packet Take 17 g by mouth daily as needed for moderate constipation.  [provider]    pregabalin (LYRICA) 75 MG capsule TAKE 1 CAPSULE BY MOUTH TWICE A DAY Patient taking differently: Take 75 mg by mouth 2 (two) times daily.  02/12/19   [name], DPM  rosuvastatin (CRESTOR) 20 MG tablet Take 1 tablet (20 mg total) by mouth daily at 6 PM. 08/21/19   [name], MD  TRUEplus Lancets 33G MISC Use as directed 06/14/19   [name], MD    Allergies    Dilaudid [hydromorphone hcl], Hydromorphone, Lipitor [atorvastatin], Adhesive [tape], Ampicillin, Latex, and Penicillins  Review of Systems   Review of Systems  Neurological: Positive for headaches.  All other systems reviewed and are negative.   Physical Exam Updated Vital Signs BP (!) 142/80 (BP Location: Left Arm)   Pulse 73   Temp 98.3 F (36.8 C) (Oral)   Resp 20   Ht 5' 4" (1.626 m)   Wt 95.7 kg   SpO2 97%   BMI 36.21 kg/m   Physical Exam Vitals and nursing note reviewed.  Constitutional:      General: She is not in acute distress.    Appearance: She is well-developed. She is not diaphoretic.  HENT:     Head: Normocephalic and atraumatic.  Eyes:     Extraocular Movements: Extraocular movements intact.     Pupils: Pupils are equal, round, and reactive to light.  Cardiovascular:     Rate and Rhythm: Normal rate and regular rhythm.     Heart sounds: No murmur heard.  No friction rub. No gallop.   Pulmonary:     Effort: Pulmonary effort is normal. No respiratory distress.     Breath sounds: Normal breath sounds. No wheezing.  Abdominal:     General: Bowel sounds are normal. There is no distension.     Palpations: Abdomen is soft.     Tenderness: There is no abdominal tenderness.  Musculoskeletal:        General: Normal range of motion.     Cervical back: Normal range of motion and neck supple.  Skin:    General: Skin is warm and dry.  Neurological:     Mental Status: She is alert and oriented to person, place, and time.     Cranial Nerves: No cranial nerve deficit or  dysarthria.     Sensory: No sensory deficit.     Motor: No weakness.     Coordination: Coordination normal.     ED Results / Procedures / Treatments   Labs (all labs ordered are listed, but only abnormal results are displayed) Labs Reviewed  BASIC METABOLIC PANEL  CBC WITH DIFFERENTIAL/PLATELET    EKG None  Radiology No results found.  Procedures Procedures (including critical care time)  Medications Ordered in ED Medications  sodium chloride 0.9 % bolus 1,000 mL (has no administration in time range)  ketorolac (TORADOL) 30 MG/ML injection 30 mg (has no administration in time range)  metoCLOPramide (REGLAN) injection 10 mg (has no administration in time range)  dexamethasone (DECADRON) injection 10 mg (has no administration in time range)  diphenhydrAMINE (BENADRYL) injection 25 mg (has no administration in time range)    ED Course  I have reviewed the triage vital signs and the nursing notes.  Pertinent labs & imaging results that were available during my care of the patient were reviewed by me and considered in my medical decision making (see chart for details).    MDM Rules/Calculators/A&P  Patient presenting here with complaints of headache and  elevated blood pressure at home.  Her blood pressure is here have been within acceptable limits.  She is neurologically intact and head CT is negative.  She has received a migraine cocktail along with IV fluids and appears to be feeling better.  I highly doubt stroke or bleed.  At this point, I feel as though discharge is appropriate with as needed follow-up/return.  Final Clinical Impression(s) / ED Diagnoses Final diagnoses:  None    Rx / DC Orders ED Discharge Orders    None       Veryl Speak, MD 10/19/19 [phone]

## 2019-10-19 NOTE — Discharge Instructions (Addendum)
Take Tylenol 1000 mg every 6 hours as needed for pain.  Continue other medications as previously prescribed.  Follow-up with primary doctor if not improving in the next few days, and return to the ER if symptoms significantly worsen or change.

## 2019-10-20 LAB — SURGICAL PATHOLOGY

## 2019-10-21 ENCOUNTER — Encounter (HOSPITAL_COMMUNITY): Payer: Self-pay

## 2019-10-22 DIAGNOSIS — N1831 Chronic kidney disease, stage 3a: Secondary | ICD-10-CM | POA: Diagnosis not present

## 2019-10-22 DIAGNOSIS — R768 Other specified abnormal immunological findings in serum: Secondary | ICD-10-CM | POA: Diagnosis not present

## 2019-10-22 DIAGNOSIS — E669 Obesity, unspecified: Secondary | ICD-10-CM | POA: Diagnosis not present

## 2019-10-22 DIAGNOSIS — Z6837 Body mass index (BMI) 37.0-37.9, adult: Secondary | ICD-10-CM | POA: Diagnosis not present

## 2019-10-23 ENCOUNTER — Other Ambulatory Visit (HOSPITAL_BASED_OUTPATIENT_CLINIC_OR_DEPARTMENT_OTHER): Payer: Self-pay | Admitting: Nephrology

## 2019-10-23 ENCOUNTER — Other Ambulatory Visit (HOSPITAL_COMMUNITY): Payer: Self-pay | Admitting: Nephrology

## 2019-10-23 ENCOUNTER — Other Ambulatory Visit: Payer: Self-pay | Admitting: Nephrology

## 2019-10-23 DIAGNOSIS — R768 Other specified abnormal immunological findings in serum: Secondary | ICD-10-CM | POA: Diagnosis not present

## 2019-10-23 DIAGNOSIS — N1831 Chronic kidney disease, stage 3a: Secondary | ICD-10-CM | POA: Diagnosis not present

## 2019-10-23 DIAGNOSIS — N179 Acute kidney failure, unspecified: Secondary | ICD-10-CM | POA: Diagnosis not present

## 2019-10-23 DIAGNOSIS — I272 Pulmonary hypertension, unspecified: Secondary | ICD-10-CM | POA: Diagnosis not present

## 2019-10-23 DIAGNOSIS — R809 Proteinuria, unspecified: Secondary | ICD-10-CM | POA: Diagnosis not present

## 2019-10-23 DIAGNOSIS — E1122 Type 2 diabetes mellitus with diabetic chronic kidney disease: Secondary | ICD-10-CM | POA: Diagnosis not present

## 2019-10-23 DIAGNOSIS — I5032 Chronic diastolic (congestive) heart failure: Secondary | ICD-10-CM | POA: Diagnosis not present

## 2019-10-23 DIAGNOSIS — I129 Hypertensive chronic kidney disease with stage 1 through stage 4 chronic kidney disease, or unspecified chronic kidney disease: Secondary | ICD-10-CM | POA: Diagnosis not present

## 2019-10-23 DIAGNOSIS — R4182 Altered mental status, unspecified: Secondary | ICD-10-CM | POA: Diagnosis not present

## 2019-10-23 DIAGNOSIS — R109 Unspecified abdominal pain: Secondary | ICD-10-CM

## 2019-10-24 ENCOUNTER — Telehealth: Payer: Self-pay | Admitting: Family Medicine

## 2019-10-24 NOTE — Telephone Encounter (Signed)
Called daughter back and they made an appointment for next Monday.

## 2019-10-24 NOTE — Telephone Encounter (Signed)
Daughter would like to discuss her mother's health problems - please call

## 2019-10-25 ENCOUNTER — Other Ambulatory Visit: Payer: Self-pay | Admitting: Nephrology

## 2019-10-25 ENCOUNTER — Other Ambulatory Visit: Payer: Self-pay

## 2019-10-25 ENCOUNTER — Ambulatory Visit (HOSPITAL_COMMUNITY)
Admission: RE | Admit: 2019-10-25 | Discharge: 2019-10-25 | Disposition: A | Discharge status: Home / Self Care | Payer: Medicare HMO | Source: Ambulatory Visit | Attending: Nephrology | Admitting: Nephrology

## 2019-10-25 ENCOUNTER — Ambulatory Visit (HOSPITAL_COMMUNITY): Payer: Medicare HMO

## 2019-10-25 ENCOUNTER — Encounter (HOSPITAL_COMMUNITY): Payer: Self-pay

## 2019-10-25 DIAGNOSIS — Z9049 Acquired absence of other specified parts of digestive tract: Secondary | ICD-10-CM | POA: Diagnosis not present

## 2019-10-25 DIAGNOSIS — R109 Unspecified abdominal pain: Secondary | ICD-10-CM

## 2019-10-25 DIAGNOSIS — R58 Hemorrhage, not elsewhere classified: Secondary | ICD-10-CM

## 2019-10-25 DIAGNOSIS — N200 Calculus of kidney: Secondary | ICD-10-CM | POA: Diagnosis not present

## 2019-10-25 DIAGNOSIS — K4020 Bilateral inguinal hernia, without obstruction or gangrene, not specified as recurrent: Secondary | ICD-10-CM | POA: Diagnosis not present

## 2019-10-25 DIAGNOSIS — I700 Atherosclerosis of aorta: Secondary | ICD-10-CM | POA: Diagnosis not present

## 2019-10-28 ENCOUNTER — Other Ambulatory Visit: Payer: Medicare HMO

## 2019-10-28 ENCOUNTER — Encounter: Payer: Self-pay | Admitting: Family Medicine

## 2019-10-28 ENCOUNTER — Other Ambulatory Visit (INDEPENDENT_AMBULATORY_CARE_PROVIDER_SITE_OTHER): Payer: Medicare HMO

## 2019-10-28 ENCOUNTER — Ambulatory Visit (INDEPENDENT_AMBULATORY_CARE_PROVIDER_SITE_OTHER): Payer: Medicare HMO | Admitting: Family Medicine

## 2019-10-28 ENCOUNTER — Other Ambulatory Visit: Payer: Self-pay

## 2019-10-28 VITALS — BP 130/68 | HR 78 | Temp 97.9°F | Resp 16 | Ht 64.0 in | Wt 208.5 lb

## 2019-10-28 DIAGNOSIS — D649 Anemia, unspecified: Secondary | ICD-10-CM | POA: Diagnosis not present

## 2019-10-28 DIAGNOSIS — N184 Chronic kidney disease, stage 4 (severe): Secondary | ICD-10-CM

## 2019-10-28 DIAGNOSIS — E118 Type 2 diabetes mellitus with unspecified complications: Secondary | ICD-10-CM

## 2019-10-28 DIAGNOSIS — E1165 Type 2 diabetes mellitus with hyperglycemia: Secondary | ICD-10-CM | POA: Diagnosis not present

## 2019-10-28 DIAGNOSIS — K661 Hemoperitoneum: Secondary | ICD-10-CM

## 2019-10-28 DIAGNOSIS — E890 Postprocedural hypothyroidism: Secondary | ICD-10-CM | POA: Diagnosis not present

## 2019-10-28 DIAGNOSIS — IMO0002 Reserved for concepts with insufficient information to code with codable children: Secondary | ICD-10-CM

## 2019-10-28 LAB — BASIC METABOLIC PANEL
BUN: 34 mg/dL — ABNORMAL HIGH (ref 6–23)
CO2: 25 mEq/L (ref 19–32)
Calcium: 10.7 mg/dL — ABNORMAL HIGH (ref 8.4–10.5)
Chloride: 107 mEq/L (ref 96–112)
Creatinine, Ser: 1.68 mg/dL — ABNORMAL HIGH (ref 0.40–1.20)
GFR: 36.33 mL/min — ABNORMAL LOW (ref >60.00)
Glucose, Bld: 195 mg/dL — ABNORMAL HIGH (ref 70–99)
Potassium: 4.9 mEq/L (ref 3.5–5.1)
Sodium: 140 mEq/L (ref 135–145)

## 2019-10-28 LAB — CBC WITH DIFFERENTIAL/PLATELET
Basophils Absolute: 0.2 10*3/uL — ABNORMAL HIGH (ref 0.0–0.1)
Basophils Relative: 2.2 % (ref 0.0–3.0)
Eosinophils Absolute: 0.2 10*3/uL (ref 0.0–0.7)
Eosinophils Relative: 2.8 % (ref 0.0–5.0)
HCT: 34.6 % — ABNORMAL LOW (ref 36.0–46.0)
Hemoglobin: 11.5 g/dL — ABNORMAL LOW (ref 12.0–15.0)
Lymphocytes Relative: 26.7 % (ref 12.0–46.0)
Lymphs Abs: 2.4 10*3/uL (ref 0.7–4.0)
MCHC: 33.2 g/dL (ref 30.0–36.0)
MCV: 89.8 fl (ref 78.0–100.0)
Monocytes Absolute: 0.7 10*3/uL (ref 0.1–1.0)
Monocytes Relative: 8.1 % (ref 3.0–12.0)
Neutro Abs: 5.3 10*3/uL (ref 1.4–7.7)
Neutrophils Relative %: 60.2 % (ref 43.0–77.0)
Platelets: 340 10*3/uL (ref 150.0–400.0)
RBC: 3.86 Mil/uL — ABNORMAL LOW (ref 3.87–5.11)
RDW: 14.7 % (ref 11.5–15.5)
WBC: 8.9 10*3/uL (ref 4.0–10.5)

## 2019-10-28 LAB — B12 AND FOLATE PANEL
Folate: 13 ng/mL (ref >5.9)
Vitamin B-12: 255 pg/mL (ref 211–911)

## 2019-10-28 LAB — TSH: TSH: 0.83 u[IU]/mL (ref 0.35–4.50)

## 2019-10-28 NOTE — Assessment & Plan Note (Signed)
New.  Occurred after renal bx on 6/30.  She has f/u US in 2 days to assess stability.  Will get CBC today to assess for drop in Hgb.

## 2019-10-28 NOTE — Assessment & Plan Note (Signed)
Suspect this is a combination of anemia of chronic disease due to her DM and renal insufficiency and also acute blood loss w/ her retroperitoneal hematoma.  Check iron level, CBC, and B12/folate.

## 2019-10-28 NOTE — Assessment & Plan Note (Signed)
Chronic problem.  Pt has hx of high A1C but recently has been experiencing symptomatic lows.  She feels that her endocrinologist doesn't explain things to her and without knowing 'why' she has a hard time understanding and following the plan.  She is asking for an Endocrinologist in [place] and would like someone to re-evaluate her regimen.

## 2019-10-28 NOTE — Assessment & Plan Note (Signed)
Pt continues to complain of being cold.  Daughter reports this is an ongoing issue but has worsened recently.  Check labs.  Adjust meds prn

## 2019-10-28 NOTE — Assessment & Plan Note (Signed)
Following w/ [facility].  The amount of protein in her urine prompted an urgent work up by Dr Irene Limbo for possible amyloidosis, myeloma, or other proliferative disorder.  Thankfully her bone marrow biopsy and workup was negative.  She then had a renal bx done.  This resulted in a retroperitoneal hematoma.  She has an [place] scheduled in 2 days to assess the size.  In the meantime will get CBC.

## 2019-10-28 NOTE — Progress Notes (Signed)
   Subjective:    Patient ID: [name], female    DOB: [birth year], 69 y.o.   MRN: [id number]  HPI 'it seems like we snowballed'- pt has had kidney bx, bone marrow bx.  After kidney bx developed 7x 6.3x 2.4 cm retroperitoneal hemorrhage.  Pt has long standing back pain so is not able to say whether she is having pain from this bleed.  Has [place] scheduled for 7/21.  Pt is confused by seeing an oncologist  DM- chronic problem, currently seeing Dr Dorris Fetch but doesn't feel that they have a good relationship.  Needs someone to better explain things to her.    Hypothyroid- 'i'm still cold'.  Pt is taking Levothyroxine 116mcg daily but still finds herself needing socks and blankets despite the heat outside.  'it's not new but it's getting worse'.  Pt has known thyroid nodule that had FNA 09/2018 and was benign.  Pt's last hemoglobin 10.2.  Now on iron supplement.  In the course of today's visit reviewed Dr Grier Mitts notes and lab results, her previous thyroid FNA results, her recent abd CT, Dr Liliane Channel most recent note, the results of her renal bx, recent labs.  Reviewed past medical, surgical, family and social histories.    Review of Systems For ROS see HPI   This visit occurred during the SARS-CoV-2 public health emergency.  Safety protocols were in place, including screening questions prior to the visit, additional usage of staff PPE, and extensive cleaning of exam room while observing appropriate contact time as indicated for disinfecting solutions.       Objective:   Physical Exam Vitals reviewed.  Constitutional:      General: She is not in acute distress.    Appearance: Normal appearance. She is well-developed. She is obese. She is not ill-appearing.  HENT:     Head: Normocephalic and atraumatic.  Eyes:     Conjunctiva/sclera: Conjunctivae normal.     Pupils: Pupils are equal, round, and reactive to light.  Neck:     Thyroid: Thyromegaly (R sided thyroid nodule) present.   Cardiovascular:     Rate and Rhythm: Normal rate and regular rhythm.     Heart sounds: Normal heart sounds. No murmur heard.   Pulmonary:     Effort: Pulmonary effort is normal. No respiratory distress.     Breath sounds: Normal breath sounds.  Abdominal:     General: There is no distension.     Palpations: Abdomen is soft.     Tenderness: There is no abdominal tenderness.  Musculoskeletal:     Cervical back: Normal range of motion and neck supple.  Lymphadenopathy:     Cervical: No cervical adenopathy.  Skin:    General: Skin is warm and dry.  Neurological:     General: No focal deficit present.     Mental Status: She is alert and oriented to person, place, and time.  Psychiatric:        Behavior: Behavior normal.     Comments: Pt is easily confused with her multiple medications, physicians, and appts.           Assessment & Plan:  Total time spent w/ pt and daughter >45 minutes w/ multiple records reviewed, meds reconciled, referrals placed, labs ordered.

## 2019-10-28 NOTE — Patient Instructions (Signed)
Follow up in 6-8 weeks to make sure all appts are going well We'll notify you of your lab results and make any changes if needed We'll call you with your new Endocrinology appt Someone from [facility] should call you to help with all of this Call Dr Louanne Skye and schedule an appt for the back pain Call with any questions or concerns Hang in there!

## 2019-10-29 ENCOUNTER — Other Ambulatory Visit: Payer: Self-pay

## 2019-10-29 LAB — IRON,TIBC AND FERRITIN PANEL
%SAT: 18 % (calc) (ref 16–45)
Ferritin: 268 ng/mL (ref 16–288)
Iron: 53 ug/dL (ref 45–160)
TIBC: 287 mcg/dL (calc) (ref 250–450)

## 2019-10-29 NOTE — Patient Outreach (Signed)
[facility]) Care Management  10/29/2019  [name] 10/03/1950 [id number]   Telephone Screen  Referral Date: 10/28/2019 Referral Source: MD Office Referral Reason: "med reconciliation, appt coordination, disease process understanding" Insurance: [employer]   Outreach attempt # 1 to patient. Spoke with patient and discussed referral source and reason. Patient voices that MD discussed referral with her yesterday at MD appt. However, patient was under the impression that a nurse would be coming out to her home to assist her. Discussed with patient that [facility] no longer making home visits due to COVID-19. Patient agreeable tod discuss medial care with RN CM.   Social: She resides in her home along with her spouse. She reports that she is independent with ADLs and most IADLs. She can drive but does not like to drive and relies on spouse to take her to appts. She denies any falls. She has walker and cane in the home.    Conditions: Per chart review, patient has PMH that includes but not limited too DM, Anemia,CKD,peripheral neuropathy, OA, DDD, herniated disc obesity, breast CA s/p mastectomy. Patient voices that appetite is decreased. She does not like to eat as she normally gets bloated and sick on her stomach. She is drinking about one Glucerna per day. She reports only a few pounds in wgt loss. She is checking cbgs 2x/day. She has not checked this morning as she is just waking up. She reports issues with cbgs dropping during the night. She admits to taking bedtime insulin and not eating snack. RN CM discussed importance of bedtime snack and provided food options. Last A1C on file 8.4(April 2021). Patient voices goal is 7.   Appointments: Patient saw PCP on yesterday. She voices she saw endocrinologist back in April. She voices that PCP is working on getting her a new endocrinologist as she does not like the one she has.  She is unsure about appt with  cardiology.  Medications: Med review completed with patient. Patient taking several BP meds and voices that she has dizzy spells often after taking meds.Encouraged patient to check BP prior to taking meds. She is wanting to stop any meds that are not necessary. She is taking meds directly from pill bottle. She voices that she used med planner briefly but it was too confusing for her. She gets meds from local [facility] and some of them from mail order pharmacy.  Medications Reviewed Today    Reviewed by Hayden Pedro, RN (Registered Nurse) on 10/29/19 at [phone]  Med List Status: <None>  Medication Order Taking? Sig Documenting Provider Last Dose Status Informant  0.9 %  sodium chloride infusion [id number]   Irene Shipper, MD  Active   acetaminophen (TYLENOL) 500 MG tablet [id number] Yes Take 1,000 mg by mouth every 6 (six) hours as needed for moderate pain or headache. [provider] Taking Active Self  albuterol (VENTOLIN HFA) 108 (90 Base) MCG/ACT inhaler [id number] No Inhale 2 puffs into the lungs every 6 (six) hours as needed for wheezing or shortness of breath.  Patient not taking: Reported on 10/04/2019   Desiree Hane, MD Not Taking Active Self  Alcohol Swabs (B-D SINGLE USE SWABS REGULAR) PADS [id number] Yes Use as directed. [name], MD Taking Active Self  amLODipine (NORVASC) 10 MG tablet [id number] Yes TAKE 1 TABLET BY MOUTH EVERY DAY  Patient taking differently: Take 10 mg by mouth daily.    Susy Frizzle, MD Taking Active Self  aspirin 81 MG tablet  [id number] Yes Take 81 mg by mouth daily. [provider] Taking Active Self  Blood Glucose Calibration (TRUE METRIX LEVEL 2) Normal SOLN [id number] Yes Use as directed Susy Frizzle, MD Taking Active Self  Blood Glucose Monitoring Suppl (TRUE METRIX AIR GLUCOSE METER) w/Device KIT [id number] Yes 1 kit by Does not apply route 3 (three) times daily. [name], MD Taking Active Self  carvedilol  (COREG) 6.25 MG tablet [id number] Yes Take 1 tablet (6.25 mg total) by mouth 2 (two) times daily with a meal. [name], MD Taking Active Self           Med Note (HOOKS, DAVIUS C   Sat Oct 19, 2019  4:03 AM) Pt takes upon waking in the morning and in the evening at supper. No specified time.  clotrimazole-betamethasone (LOTRISONE) cream [id number] Yes Apply 1 application topically 2 (two) times daily.  Patient taking differently: Apply 1 application topically 2 (two) times daily as needed (irritation).    [name], MD Taking Active Self  Continuous Blood Gluc Sensor (FREESTYLE LIBRE 14 DAY SENSOR) MISC [id number] No Inject 1 each into the skin every 14 (fourteen) days. Use as directed.  Patient not taking: Reported on 10/04/2019   Cassandria Anger, MD Not Taking Active Self  diclofenac Sodium (VOLTAREN) 1 % GEL [id number] Yes Apply 4 g topically 4 (four) times daily as needed.  Patient taking differently: Apply 4 g topically 4 (four) times daily as needed (pain).    Desiree Hane, MD Taking Active Self  ferrous gluconate (FERGON) 324 MG tablet [id number] Yes TAKE 1 TABLET (324 MG TOTAL) BY MOUTH 2 (TWO) TIMES DAILY WITH A MEAL. Julian Hy, DO Taking Active   fluticasone (FLONASE) 50 MCG/ACT nasal spray [id number] Yes Place 2 sprays into both nostrils daily.  Patient taking differently: Place 2 sprays into both nostrils daily as needed for allergies.    Susy Frizzle, MD Taking Active Self  furosemide (LASIX) 40 MG tablet [id number] Yes TAKE 1 TABLET (40 MG TOTAL) BY MOUTH DAILY AS NEEDED (LEG SWELLING).  Patient taking differently: Take 40 mg by mouth daily as needed (leg swelling). Patient taking 110m daily   PSusy Frizzle MD Taking Active Self  glipiZIDE (GLUCOTROL XL) 5 MG 24 hr tablet [id number] TAKE 1 TABLET BY MOUTH EVERY DAY WITH BREAKFAST  Patient taking differently: Take 5 mg by mouth daily with breakfast.    NCassandria Anger MD Taking Active  Self  glucose blood (TRUE METRIX BLOOD GLUCOSE TEST) test strip [id number] Use as instructed TMidge Minium MD Taking Active Self  hydrALAZINE (APRESOLINE) 50 MG tablet [id number] Patient taking 265m3x/day [provider] Taking Active Self  insulin NPH-regular Human (70-30) 100 UNIT/ML injection [id number] Inject 60 Units into the skin See admin instructions. 60 units with breakfast and 60 units with supper for BG above 90 [provider] Taking Active Self           [facility] ([facility]   Wed Oct 09, 2019  7:05 AM) Took 40 units  levothyroxine (SYNTHROID) 100 MCG tablet [id number] Take 1 tablet (100 mcg total) by mouth daily. TaMidge MiniumMD Taking Active Self  losartan (COZAAR) 100 MG tablet [id number] Take 100 mg by mouth daily.  Patient not taking: Reported on 10/29/2019   [provider] Not Taking Active            Med Note (HOOKS,  DAVIUS C   Sat Oct 19, 2019  4:27 AM) Lf 09-11-19 qty 90 ds 90 [facility]  metoCLOPramide (REGLAN) 10 MG tablet [id number] No Take 1 tablet (10 mg total) by mouth 4 (four) times daily.  Patient not taking: Reported on 10/29/2019   [name], MD Not Taking Active Self  ondansetron [facility]) 4 MG tablet [id number] Yes Take 4 mg by mouth every 8 (eight) hours as needed for nausea or vomiting. [provider] Taking Active Self  pantoprazole (PROTONIX) 40 MG tablet [id number] Yes Take 1 tablet (40 mg total) by mouth 2 (two) times daily.  Patient taking differently: Take 40 mg by mouth 2 (two) times daily as needed (acid reflux).    [name], MD Taking Active Self  polyethylene glycol [facility] / GLYCOLAX) packet [id number] Yes Take 17 g by mouth daily as needed for moderate constipation. [provider] Taking Active Self  rosuvastatin (CRESTOR) 20 MG tablet [id number] Yes Take 1 tablet (20 mg total) by mouth daily at 6 PM. [name], MD Taking Active Self   traMADol (ULTRAM) 50 MG tablet [id number] Yes Take 50 mg by mouth every 6 (six) hours as needed. 1-2tabs qhrs prn [provider] Taking Active Self  TRUEplus Lancets 33G [place] [id number] Yes Use as directed [name], MD Taking Active Self            Fall Risk  10/29/2019 10/28/2019 09/17/2019 06/26/2019 04/26/2019  Falls in the past year? 0 0 0 0 0  Number falls in past yr: 0 0 0 0 0  Injury with Fall? 0 0 0 0 0  Risk for fall due to : Medication side effect;Impaired balance/gait;Impaired mobility Impaired balance/gait;Impaired mobility - Medication side effect -  Follow up Falls evaluation completed;Education provided - - Falls evaluation completed;Education provided;Falls prevention discussed Falls evaluation completed   Depression screen [facility] 2/9 10/29/2019 10/28/2019 06/26/2019 04/26/2019 12/27/2018  Decreased Interest 0 0 2 0 0  Down, Depressed, Hopeless 0 0 2 0 0  PHQ - 2 Score 0 0 4 0 0  Altered sleeping - 0 3 0 0  Tired, decreased energy - 0 3 0 0  Change in appetite - 0 2 0 0  Feeling bad or failure about yourself  - 0 1 0 0  Trouble concentrating - 0 1 0 0  Moving slowly or fidgety/restless - 0 1 0 0  Suicidal thoughts - 0 0 0 0  PHQ-9 Score - 0 15 0 0  Difficult doing work/chores - Not difficult at all Somewhat difficult Not difficult at all -  Some recent data might be hidden   SDOH Screenings   Alcohol Screen:   . Last Alcohol Screening Score (AUDIT):   Depression (PHQ2-9): Low Risk   . PHQ-2 Score: 0  Financial Resource Strain:   . Difficulty of Paying Living Expenses:   Food Insecurity: No Food Insecurity  . Worried About Charity fundraiser in the Last Year: Never true  . Ran Out of Food in the Last Year: Never true  Housing: Low Risk   . Last Housing Risk Score: 0  Physical Activity:   . Days of Exercise per Week:   . Minutes of Exercise per Session:   Social Connections:   . Frequency of Communication with Friends and Family:   . Frequency of  Social Gatherings with Friends and Family:   . Attends Religious Services:   . Active Member of Clubs or Organizations:   .  Attends Archivist Meetings:   [name] Kitchen Marital Status:   Stress:   . Feeling of Stress :   Tobacco Use: Low Risk   . Smoking Tobacco Use: Never Smoker  . Smokeless Tobacco Use: Never Used  Transportation Needs: No Transportation Needs  . Lack of Transportation (Medical): No  . Lack of Transportation (Non-Medical): No    [facility] CM Care Plan Problem One     Most Recent Value  Care Plan Problem One Knowledge deficit related to mgmt of Diabetes  Role Documenting the Problem One Care Management Kila for Problem One Active  Chamita will report a lowering of A1C level from 8.4 over the next 90 days.  [facility] Long Term Goal Start Date 10/29/19  Interventions for Problem One Long Term Goal RN CM assessed diabetes mgmt, cbg monitorign and values. RN CM reinforced diabetic education, discussed ways to manage disease and sxs, assessed for med adherence    [facility] CM Care Plan Problem Two     Most Recent Value  Care Plan Problem Two Medication mgmt  Role Documenting the Problem Two Care Management Telephonic Coordinator  Care Plan for Problem Two Active  Interventions for Problem Two Long Term Goal  RNCM assessed for med ahdernece/mgmt barriers, completed med review, referred pt to pharmacy for furhter assistance  [facility] Long Term Goal Patient will report being able to safely and accurately manage meds over the next 60 days.  [facility] Long Term Goal Start Date 10/29/19       Consent: [facility] services reviewed and discussed with patient. Verbal consent for services given.    Plan: RN CM will discussed with patient next outreach within the month of  August. Patient gave verbal consent and in agreement with RN CM follow up and timeframe. Patient aware that they may contact RN CM sooner for any issues or concerns. RN CM will send welcome  letter to patient. RN CM will send barriers letter and route encounter to PCP. RN CM will send [place] referral for med review, polypharmacy and medication mgmt.  Enzo Montgomery, RN,BSN,CCM [place] Management Telephonic Care Management Coordinator Direct Phone: [phone] Toll Free: [phone] Fax: [phone]

## 2019-10-30 ENCOUNTER — Ambulatory Visit
Admission: RE | Admit: 2019-10-30 | Discharge: 2019-10-30 | Disposition: A | Discharge status: Home / Self Care | Payer: Medicare HMO | Source: Ambulatory Visit | Attending: Nephrology | Admitting: Nephrology

## 2019-10-30 DIAGNOSIS — Q631 Lobulated, fused and horseshoe kidney: Secondary | ICD-10-CM | POA: Diagnosis not present

## 2019-10-30 DIAGNOSIS — N200 Calculus of kidney: Secondary | ICD-10-CM | POA: Diagnosis not present

## 2019-10-30 DIAGNOSIS — K661 Hemoperitoneum: Secondary | ICD-10-CM | POA: Diagnosis not present

## 2019-10-30 DIAGNOSIS — Q600 Renal agenesis, unilateral: Secondary | ICD-10-CM | POA: Diagnosis not present

## 2019-10-30 DIAGNOSIS — R58 Hemorrhage, not elsewhere classified: Secondary | ICD-10-CM

## 2019-10-30 NOTE — Patient Outreach (Signed)
Referral from Enzo Montgomery, RN to [place] for medication assistance.

## 2019-11-04 ENCOUNTER — Emergency Department (HOSPITAL_BASED_OUTPATIENT_CLINIC_OR_DEPARTMENT_OTHER)
Admission: EM | Admit: 2019-11-04 | Discharge: 2019-11-05 | Disposition: A | Discharge status: Home / Self Care | Payer: Medicare HMO | Attending: Emergency Medicine | Admitting: Emergency Medicine

## 2019-11-04 ENCOUNTER — Other Ambulatory Visit: Payer: Self-pay

## 2019-11-04 ENCOUNTER — Encounter (HOSPITAL_BASED_OUTPATIENT_CLINIC_OR_DEPARTMENT_OTHER): Payer: Self-pay

## 2019-11-04 ENCOUNTER — Emergency Department (HOSPITAL_BASED_OUTPATIENT_CLINIC_OR_DEPARTMENT_OTHER): Payer: Medicare HMO

## 2019-11-04 DIAGNOSIS — I5032 Chronic diastolic (congestive) heart failure: Secondary | ICD-10-CM | POA: Insufficient documentation

## 2019-11-04 DIAGNOSIS — Z96651 Presence of right artificial knee joint: Secondary | ICD-10-CM | POA: Insufficient documentation

## 2019-11-04 DIAGNOSIS — Z7984 Long term (current) use of oral hypoglycemic drugs: Secondary | ICD-10-CM | POA: Diagnosis not present

## 2019-11-04 DIAGNOSIS — Z79899 Other long term (current) drug therapy: Secondary | ICD-10-CM | POA: Insufficient documentation

## 2019-11-04 DIAGNOSIS — N183 Chronic kidney disease, stage 3 unspecified: Secondary | ICD-10-CM | POA: Diagnosis not present

## 2019-11-04 DIAGNOSIS — M542 Cervicalgia: Secondary | ICD-10-CM | POA: Insufficient documentation

## 2019-11-04 DIAGNOSIS — M25511 Pain in right shoulder: Secondary | ICD-10-CM | POA: Diagnosis not present

## 2019-11-04 DIAGNOSIS — E1140 Type 2 diabetes mellitus with diabetic neuropathy, unspecified: Secondary | ICD-10-CM | POA: Diagnosis not present

## 2019-11-04 DIAGNOSIS — E039 Hypothyroidism, unspecified: Secondary | ICD-10-CM | POA: Diagnosis not present

## 2019-11-04 DIAGNOSIS — C50911 Malignant neoplasm of unspecified site of right female breast: Secondary | ICD-10-CM | POA: Insufficient documentation

## 2019-11-04 DIAGNOSIS — Z91040 Latex allergy status: Secondary | ICD-10-CM | POA: Diagnosis not present

## 2019-11-04 DIAGNOSIS — I130 Hypertensive heart and chronic kidney disease with heart failure and stage 1 through stage 4 chronic kidney disease, or unspecified chronic kidney disease: Secondary | ICD-10-CM | POA: Insufficient documentation

## 2019-11-04 NOTE — ED Triage Notes (Signed)
Pt c/o R shoulder pain x 1 month. Pt states pain is at its worst today.

## 2019-11-05 MED ORDER — CYCLOBENZAPRINE HCL 5 MG PO TABS
5.0000 mg | ORAL_TABLET | Freq: Two times a day (BID) | ORAL | 0 refills | Status: DC | PRN
Start: 2019-11-05 — End: 2020-05-29

## 2019-11-05 MED ORDER — CYCLOBENZAPRINE HCL 5 MG PO TABS
5.0000 mg | ORAL_TABLET | Freq: Once | ORAL | Status: AC
  Administered 2019-11-05: 5 mg via ORAL
  Filled 2019-11-05: qty 1

## 2019-11-05 NOTE — ED Provider Notes (Signed)
[facility] EMERGENCY DEPARTMENT Provider Note   CSN: [id number] Arrival date & time: 11/04/19  2004     History Chief Complaint  Patient presents with  . Shoulder Pain    [name] is a 69 y.o. female.  The history is provided by the patient.  Shoulder Pain Location:  Shoulder Shoulder location:  R shoulder Injury: no   Pain details:    Quality:  Aching   Radiates to: right neck.   Severity:  Moderate   Onset quality:  Gradual   Duration:  1 month   Timing:  Constant   Progression:  Worsening Relieved by:  Rest Worsened by:  Movement Associated symptoms: neck pain   Associated symptoms: no back pain, no fever and no muscle weakness       Patient with extensive medical history presents with right shoulder pain for 1 month.  Denies any trauma or heavy lifting.  Pain is now radiating from her shoulder to the neck.  No chest pain.  Home medications have not improved her symptoms.  No previous surgeries to her shoulder, but reports neck surgery in the distant past.  Past Medical History:  Diagnosis Date  . Anginal pain ([place]) 03/19/2012   saw Dr. Cathie Olden .. she thinks its more related to stomach issues  . Arthritis    "all over; mostly in my back" (03/20/2012)  . Breast cancer ([place])    "right" (03/20/2012)  . Cardiomyopathy   . Chest pain 06/2005   Hospitalized, dystolic dysfunction  . Chronic lower back pain    "have 2 herniated discs; going to have to have a fusion" (03/20/2012)  . CKD (chronic kidney disease), stage III   . Family history of anesthesia complication    "father has nausea and vomiting"  . Gallstones   . GERD (gastroesophageal reflux disease)   . H/O hiatal hernia   . History of kidney stones   . History of right mastectomy   . Hypertension    "patient states never had HTN, takes Hyzaar for heart  . Hypothyroidism   . Irritable bowel syndrome   . Migraines    "it's been a long time since I've had one" (03/20/2012)  .  Osteoarthritis   . Peripheral neuropathy   . PONV (postoperative nausea and vomiting)   . Sciatica   . Type II diabetes mellitus [facility])     Patient Active Problem List   Diagnosis Date Noted  . Retroperitoneal hematoma 10/28/2019  . Chronic renal impairment, stage 4 (severe) ([place]) 10/28/2019  . Anemia 10/28/2019  . Physical exam 06/26/2019  . Neck pain 04/18/2019  . Ascending aortic aneurysm ([place]) 04/17/2019  . Hypoxia   . Nonspecific chest pain 04/15/2019  . Neuropathic pain of right flank 12/27/2018  . Dysphagia 12/27/2018  . Postsurgical hypothyroidism 06/11/2018  . Vitamin D deficiency 06/11/2018  . Multinodular goiter 06/11/2018  . Hypercalcemia 06/11/2018  . DM type 2 causing vascular disease ([place])   . PONV (postoperative nausea and vomiting)   . Peripheral neuropathy   . Irritable bowel syndrome   . Hypertension   . History of right mastectomy   . H/O hiatal hernia   . GERD (gastroesophageal reflux disease)   . Family history of anesthesia complication   . Diabetes mellitus type 2, uncontrolled, with complications ([place])   . Lower extremity edema   . Chronic constipation 06/23/2015  . Morbid obesity ([place]) 06/12/2015  . Chronic diastolic heart failure ([place]) 11/17/2014  . Herniated nucleus pulposus,  L2-3 right 02/04/2014    Class: Acute  . Spondylolisthesis of lumbar region 04/30/2012    Class: Chronic  . Lumbar stenosis with neurogenic claudication 04/30/2012    Class: Chronic  . Anginal pain ([place]) 03/19/2012  . SYNCOPE-CAROTID SINUS 09/16/2008  . OSTEOARTHRITIS 08/23/2006  . DDD (degenerative disc disease), cervical 08/23/2006  . [place] DISEASE, LUMBAR 08/23/2006  . Headache(784.0) 08/23/2006  . BREAST CANCER, HX OF 08/23/2006  . HYPERPARATHYROIDISM, HX OF 08/23/2006  . RENAL CALCULUS, HX OF 08/23/2006    Past Surgical History:  Procedure Laterality Date  . ABDOMINAL HYSTERECTOMY  1993  . adenosin cardiolite  07/2005   (+) wall motion abnormality  .  AXILLARY LYMPH NODE DISSECTION  2003   squamous cell cancer  . BACK SURGERY    . CARDIAC CATHETERIZATION  ? 2005 / 2007  . CHOLECYSTECTOMY N/A 01/24/2018   Procedure: LAPAROSCOPIC CHOLECYSTECTOMY;  Surgeon: Erroll Luna, MD;  Location: [place] [place];  Service: General;  Laterality: N/A;  . CT abdomen and pelvis  02/2010   Same  . ESOPHAGOGASTRODUODENOSCOPY  2005   GERD  . KNEE ARTHROSCOPY  1990's   "right" (03/20/2012)  . LUMBAR FUSION N/A 08/22/2014   Procedure: Right L2-3 and right L1-2 transforaminal lumbar interbody fusion with pedicle screws, rods, sleeves, cages, local bone graft, Vivigen, cancellous chips;  Surgeon: Jessy Oto, MD;  Location: [facility];  Service: Orthopedics;  Laterality: N/A;  . LUMBAR LAMINECTOMY/DECOMPRESSION MICRODISCECTOMY N/A 02/04/2014   Procedure: RIGHT L2-3 MICRODISCECTOMY ;  Surgeon: Jessy Oto, MD;  Location: [facility];  Service: Orthopedics;  Laterality: N/A;  . MASTECTOMY Right   . MASTECTOMY WITH AXILLARY LYMPH NODE DISSECTION  12/2003   "right" (03/20/2012)  . POSTERIOR CERVICAL FUSION/FORAMINOTOMY  2001  . SHOULDER ARTHROSCOPY W/ ROTATOR CUFF REPAIR  2003   Left  . THYROIDECTOMY  1977   Right  . TOTAL KNEE ARTHROPLASTY  2000   Right  . [place] of abdomen  07/2005   Fatty liver / kidney stones     OB History   No obstetric history on file.     Family History  Problem Relation Age of Onset  . Hypertension Mother   . Diabetes Father   . Hypertension Father   . Hypertension Sister   . Hypertension Brother   . Hypertension Brother   . Diabetes Brother   . Diabetes Brother   . Diabetes Other        Family Hx. of Diabetes  . Hypertension Other        Family History of HTN  . Deep vein thrombosis Daughter   . Breast cancer Maternal Aunt   . Colon cancer Neg Hx   . Esophageal cancer Neg Hx   . Liver cancer Neg Hx   . Rectal cancer Neg Hx   . Stomach cancer Neg Hx     Social History   Tobacco Use  . Smoking status: Never Smoker  .  Smokeless tobacco: Never Used  Vaping Use  . Vaping Use: Never used  Substance Use Topics  . Alcohol use: No  . Drug use: No    Home Medications Prior to Admission medications   Medication Sig Start Date End Date Taking? Authorizing Provider  acetaminophen (TYLENOL) 500 MG tablet Take 1,000 mg by mouth every 6 (six) hours as needed for moderate pain or headache.    [provider]  albuterol (VENTOLIN HFA) 108 (90 Base) MCG/ACT inhaler Inhale 2 puffs into the lungs every 6 (six) hours  as needed for wheezing or shortness of breath. Patient not taking: Reported on 10/04/2019 04/20/19   Oretha Milch D, MD  Alcohol Swabs (B-D SINGLE USE SWABS REGULAR) PADS Use as directed. 06/14/19   [name], MD  amLODipine (NORVASC) 10 MG tablet TAKE 1 TABLET BY MOUTH EVERY DAY Patient taking differently: Take 10 mg by mouth daily.  03/29/19   Susy Frizzle, MD  aspirin 81 MG tablet Take 81 mg by mouth daily.    [provider]  Blood Glucose Calibration (TRUE METRIX LEVEL 2) Normal SOLN Use as directed 05/17/17   Susy Frizzle, MD  Blood Glucose Monitoring Suppl (TRUE METRIX AIR GLUCOSE METER) w/Device KIT 1 kit by Does not apply route 3 (three) times daily. 06/14/19   [name], MD  carvedilol (COREG) 6.25 MG tablet Take 1 tablet (6.25 mg total) by mouth 2 (two) times daily with a meal. 06/21/19   [name], MD  clotrimazole-betamethasone (LOTRISONE) cream Apply 1 application topically 2 (two) times daily. Patient taking differently: Apply 1 application topically 2 (two) times daily as needed (irritation).  08/15/19   [name], MD  Continuous Blood Gluc Sensor (FREESTYLE LIBRE 14 DAY SENSOR) MISC Inject 1 each into the skin every 14 (fourteen) days. Use as directed. Patient not taking: Reported on 10/04/2019 09/02/19   Cassandria Anger, MD  cyclobenzaprine (FLEXERIL) 5 MG tablet Take 1 tablet (5 mg total) by mouth 2 (two) times daily as needed for  muscle spasms. 11/05/19   Ripley Fraise, MD  diclofenac Sodium (VOLTAREN) 1 % GEL Apply 4 g topically 4 (four) times daily as needed. Patient taking differently: Apply 4 g topically 4 (four) times daily as needed (pain).  04/20/19   Oretha Milch D, MD  ferrous gluconate (FERGON) 324 MG tablet TAKE 1 TABLET (324 MG TOTAL) BY MOUTH 2 (TWO) TIMES DAILY WITH A MEAL. 10/15/19   Noemi Chapel P, DO  fluticasone (FLONASE) 50 MCG/ACT nasal spray Place 2 sprays into both nostrils daily. Patient taking differently: Place 2 sprays into both nostrils daily as needed for allergies.  05/10/17   Susy Frizzle, MD  furosemide (LASIX) 40 MG tablet TAKE 1 TABLET (40 MG TOTAL) BY MOUTH DAILY AS NEEDED (LEG SWELLING). Patient taking differently: Take 40 mg by mouth daily as needed (leg swelling). Patient taking 74m daily 01/09/19   PSusy Frizzle MD  glipiZIDE (GLUCOTROL XL) 5 MG 24 hr tablet TAKE 1 TABLET BY MOUTH EVERY DAY WITH BREAKFAST Patient taking differently: Take 5 mg by mouth daily with breakfast.  09/24/19   NCassandria Anger MD  glucose blood (TRUE METRIX BLOOD GLUCOSE TEST) test strip Use as instructed 06/14/19   TMidge Minium MD  hydrALAZINE (APRESOLINE) 50 MG tablet Patient taking 2105m3x/day 10/23/19   [provider]  insulin NPH-regular Human (70-30) 100 UNIT/ML injection Inject 60 Units into the skin See admin instructions. 60 units with breakfast and 60 units with supper for BG above 90    [provider]  levothyroxine (SYNTHROID) 100 MCG tablet Take 1 tablet (100 mcg total) by mouth daily. 06/21/19   TaMidge MiniumMD  losartan (COZAAR) 100 MG tablet Take 100 mg by mouth daily. Patient not taking: Reported on 10/29/2019 09/11/19   [provider]  metoCLOPramide (REGLAN) 10 MG tablet Take 1 tablet (10 mg total) by mouth 4 (four) times daily. Patient not taking: Reported on 10/29/2019 08/21/19   TaMidge MiniumMD  ondansetron (ZOFRAN) 4 MG tablet Take  4 mg by mouth every 8 (eight) hours as needed for nausea or vomiting.    [provider]  pantoprazole (PROTONIX) 40 MG tablet Take 1 tablet (40 mg total) by mouth 2 (two) times daily. Patient taking differently: Take 40 mg by mouth 2 (two) times daily as needed (acid reflux).  06/21/19   [name], MD  Polyethyl Glycol-Propyl Glycol (SYSTANE) 0.4-0.3 % SOLN Apply to eye. Taking 2x/day    [provider]  polyethylene glycol (MIRALAX / GLYCOLAX) packet Take 17 g by mouth daily as needed for moderate constipation.    [provider]  rosuvastatin (CRESTOR) 20 MG tablet Take 1 tablet (20 mg total) by mouth daily at 6 PM. 08/21/19   [name], MD  traMADol (ULTRAM) 50 MG tablet Take 50 mg by mouth every 6 (six) hours as needed. 1-2tabs qhrs prn    [provider]  TRUEplus Lancets 33G MISC Use as directed 06/14/19   [name], MD    Allergies    Dilaudid [hydromorphone hcl], Hydromorphone, Lipitor [atorvastatin], Adhesive [tape], Ampicillin, Latex, and Penicillins  Review of Systems   Review of Systems  Constitutional: Negative for fever.  Cardiovascular: Negative for chest pain.  Musculoskeletal: Positive for neck pain. Negative for back pain and joint swelling.  Neurological: Negative for weakness.  All other systems reviewed and are negative.   Physical Exam Updated Vital Signs BP (!) 136/55   Pulse 78   Temp 99.1 F (37.3 C) (Oral)   Resp 18   Ht 1.626 m ('5\' 4"' )   Wt (!) 95.3 kg   SpO2 98%   BMI 36.05 kg/m   Physical Exam CONSTITUTIONAL: Well developed/well nourished HEAD: Normocephalic/atraumatic EYES: EOMI/PERRL ENMT: Mucous membranes moist NECK: supple no meningeal signs SPINE/BACK:entire spine nontender CV: S1/S2 noted, no murmurs/rubs/gallops noted LUNGS: Lungs are clear to auscultation bilaterally, no apparent distress ABDOMEN: soft, nontender, no rebound or guarding, bowel sounds noted throughout  abdomen GU:no cva tenderness NEURO: Pt is awake/alert/appropriate, moves all extremitiesx4.  No facial droop.  Equal power (5/5) with hand grip, wrist flex/extension, elbow flex/extension, and equal power with shoulder abduction/adduction.  No focal sensory deficit to light touch is noted in either UE.   Equal (2+) biceps/brachioradialis reflex in bilateral UE EXTREMITIES: pulses normal/equal, full ROM, mild tenderness to right shoulder.  Mild tenderness to right trapezius.  No erythema, no deformities Distal pulses equal intact.  No edema noted to right upper extremity SKIN: warm, color normal PSYCH: no abnormalities of mood noted, alert and oriented to situation  ED Results / Procedures / Treatments   Labs (all labs ordered are listed, but only abnormal results are displayed) Labs Reviewed - No data to display  EKG None  Radiology DG Shoulder Right  Result Date: 11/04/2019 CLINICAL DATA:  69 year old female with right shoulder pain. No known injury. EXAM: RIGHT SHOULDER - 2+ VIEW COMPARISON:  Right shoulder radiograph dated 01/24/2017. FINDINGS: No definite acute fracture or dislocation. The bones are osteopenic. Mild degenerative changes with mild elevation of the humeral head. The soft tissues are unremarkable. Multiple surgical clips noted in the right axilla. IMPRESSION: No definite acute fracture or dislocation. Electronically Signed   By: Anner Crete M.D.   On: 11/04/2019 20:55    Procedures Procedures   Medications Ordered in ED Medications  cyclobenzaprine (FLEXERIL) tablet 5 mg (5 mg Oral Given 11/05/19 0036)    ED Course  I have reviewed the triage  vital signs and the nursing notes.  Pertinent  imaging results that were available during my care of the patient were reviewed by me and considered in my medical decision making (see chart for details).    MDM Rules/Calculators/A&P                          X-ray negative.  No neuro deficits.  Pain appears to be  clearly musculoskeletal.  Given sling for comfort.  Short course of muscle relaxant, she reports good relief with Flexeril in the past.  She will follow-up with her orthopedist Final Clinical Impression(s) / ED Diagnoses Final diagnoses:  Acute pain of right shoulder    Rx / DC Orders ED Discharge Orders         Ordered    cyclobenzaprine (FLEXERIL) 5 MG tablet  2 times daily PRN     Discontinue  Reprint     11/05/19 0027           Ripley Fraise, MD 11/05/19 0120

## 2019-11-14 ENCOUNTER — Other Ambulatory Visit: Payer: Self-pay

## 2019-11-14 ENCOUNTER — Ambulatory Visit
Admission: RE | Admit: 2019-11-14 | Discharge: 2019-11-14 | Disposition: A | Discharge status: Home / Self Care | Payer: Medicare HMO | Source: Ambulatory Visit | Attending: Family Medicine | Admitting: Family Medicine

## 2019-11-14 DIAGNOSIS — Z1231 Encounter for screening mammogram for malignant neoplasm of breast: Secondary | ICD-10-CM | POA: Diagnosis not present

## 2019-11-27 DIAGNOSIS — N1831 Chronic kidney disease, stage 3a: Secondary | ICD-10-CM | POA: Diagnosis not present

## 2019-11-29 ENCOUNTER — Other Ambulatory Visit: Payer: Self-pay

## 2019-11-29 ENCOUNTER — Ambulatory Visit: Payer: Medicare HMO | Admitting: Specialist

## 2019-11-29 ENCOUNTER — Encounter: Payer: Self-pay | Admitting: Specialist

## 2019-11-29 VITALS — BP 147/71 | HR 72 | Ht 64.0 in | Wt 210.0 lb

## 2019-11-29 DIAGNOSIS — M4005 Postural kyphosis, thoracolumbar region: Secondary | ICD-10-CM

## 2019-11-29 DIAGNOSIS — T84498A Other mechanical complication of other internal orthopedic devices, implants and grafts, initial encounter: Secondary | ICD-10-CM | POA: Diagnosis not present

## 2019-11-29 DIAGNOSIS — M1611 Unilateral primary osteoarthritis, right hip: Secondary | ICD-10-CM

## 2019-11-29 DIAGNOSIS — M5135 Other intervertebral disc degeneration, thoracolumbar region: Secondary | ICD-10-CM

## 2019-11-29 DIAGNOSIS — M8000XA Age-related osteoporosis with current pathological fracture, unspecified site, initial encounter for fracture: Secondary | ICD-10-CM

## 2019-11-29 MED ORDER — HYDROCODONE-ACETAMINOPHEN 5-325 MG PO TABS: 1.0000 | ORAL_TABLET | Freq: Four times a day (QID) | ORAL | 0 refills | Status: DC | PRN

## 2019-11-29 NOTE — Patient Instructions (Signed)
Avoid frequent bending and stooping  No lifting greater than 10 lbs. May use ice or moist heat for pain. Weight loss is of benefit. Avoid arthritis medications like motrin, celebrex and naprosyn these worsen kidney function Exercise is important to improve your indurance and does allow people to function better inspite of back pain. Start using your brace that you used following surgery to see if pain improves. Schedule an appointment to see Dr. Ninfa Linden concerning right hip arthritis.

## 2019-11-29 NOTE — Progress Notes (Signed)
Office Visit Note   [name]           Date of Birth: 04/05/1951           MRN: [id number] Visit Date: 11/29/2019              Requested by: [name], MD [address] [place],  [place] [address] PCP: [name], MD   Assessment & Plan: Visit Diagnoses:  1. DDD (degenerative disc disease), thoracolumbar   2. Postural kyphosis of thoracolumbar region   3. Loosening of hardware in spine ([place])   4. Osteoporosis with current pathological fracture, unspecified osteoporosis type, initial encounter     Plan: Avoid frequent bending and stooping  No lifting greater than 10 lbs. May use ice or moist heat for pain. Weight loss is of benefit. Avoid arthritis medications like motrin, celebrex and naprosyn these worsen kidney function Exercise is important to improve your indurance and does allow people to function better inspite of back pain. Start using your brace that you used following surgery to see if pain improves. Schedule an appointment to see Dr. Ninfa Linden concerning right hip arthritis.  Follow-Up Instructions: No follow-ups on file.   Orders:  Orders Placed This Encounter  Procedures  . DG BONE DENSITY (DXA)   Meds ordered this encounter  Medications  . HYDROcodone-acetaminophen (NORCO/VICODIN) 5-325 MG tablet    Sig: Take 1 tablet by mouth every 6 (six) hours as needed for moderate pain.    Dispense:  30 tablet    Refill:  0      Procedures: No procedures performed   Clinical Data: No additional findings.   Subjective: Chief Complaint  Patient presents with  . Lower Back - Follow-up    HPI  Review of Systems   Objective: Vital Signs: BP (!) 147/71   Pulse 72   Ht 5\' 4"  (1.626 m)   Wt 210 lb (95.3 kg)   BMI 36.05 kg/m   Physical Exam  Ortho Exam  Specialty Comments:  No specialty comments available.  Imaging: No results found.   PMFS History: Patient Active Problem List   Diagnosis Date Noted    . Herniated nucleus pulposus, L2-3 right 02/04/2014    Priority: High    Class: Acute  . Lumbar stenosis with neurogenic claudication 04/30/2012    Priority: High    Class: Chronic  . Retroperitoneal hematoma 10/28/2019  . Chronic renal impairment, stage 4 (severe) ([place]) 10/28/2019  . Anemia 10/28/2019  . Physical exam 06/26/2019  . Neck pain 04/18/2019  . Ascending aortic aneurysm ([place]) 04/17/2019  . Hypoxia   . Nonspecific chest pain 04/15/2019  . Neuropathic pain of right flank 12/27/2018  . Dysphagia 12/27/2018  . Postsurgical hypothyroidism 06/11/2018  . Vitamin D deficiency 06/11/2018  . Multinodular goiter 06/11/2018  . Hypercalcemia 06/11/2018  . DM type 2 causing vascular disease ([place])   . PONV (postoperative nausea and vomiting)   . Peripheral neuropathy   . Irritable bowel syndrome   . Hypertension   . History of right mastectomy   . H/O hiatal hernia   . GERD (gastroesophageal reflux disease)   . Family history of anesthesia complication   . Diabetes mellitus type 2, uncontrolled, with complications ([place])   . Lower extremity edema   . Chronic constipation 06/23/2015  . Morbid obesity ([place]) 06/12/2015  . Chronic diastolic heart failure ([place]) 11/17/2014  . Spondylolisthesis of lumbar region 04/30/2012    Class: Chronic  .  Anginal pain ([place]) 03/19/2012  . SYNCOPE-CAROTID SINUS 09/16/2008  . OSTEOARTHRITIS 08/23/2006  . DDD (degenerative disc disease), cervical 08/23/2006  . [place] DISEASE, LUMBAR 08/23/2006  . Headache(784.0) 08/23/2006  . BREAST CANCER, HX OF 08/23/2006  . HYPERPARATHYROIDISM, HX OF 08/23/2006  . RENAL CALCULUS, HX OF 08/23/2006   Past Medical History:  Diagnosis Date  . Anginal pain ([place]) 03/19/2012   saw Dr. Cathie Olden .. she thinks its more related to stomach issues  . Arthritis    "all over; mostly in my back" (03/20/2012)  . Breast cancer ([place])    "right" (03/20/2012)  . Cardiomyopathy   . Chest pain 06/2005   Hospitalized, dystolic  dysfunction  . Chronic lower back pain    "have 2 herniated discs; going to have to have a fusion" (03/20/2012)  . CKD (chronic kidney disease), stage III   . Family history of anesthesia complication    "father has nausea and vomiting"  . Gallstones   . GERD (gastroesophageal reflux disease)   . H/O hiatal hernia   . History of kidney stones   . History of right mastectomy   . Hypertension    "patient states never had HTN, takes Hyzaar for heart  . Hypothyroidism   . Irritable bowel syndrome   . Migraines    "it's been a long time since I've had one" (03/20/2012)  . Osteoarthritis   . Peripheral neuropathy   . PONV (postoperative nausea and vomiting)   . Sciatica   . Type II diabetes mellitus ([facility])     Family History  Problem Relation Age of Onset  . Hypertension Mother   . Breast cancer Mother 73  . Diabetes Father   . Hypertension Father   . Hypertension Sister   . Hypertension Brother   . Hypertension Brother   . Diabetes Brother   . Diabetes Brother   . Diabetes Other        Family Hx. of Diabetes  . Hypertension Other        Family History of HTN  . Deep vein thrombosis Daughter   . Breast cancer Maternal Aunt   . Colon cancer Neg Hx   . Esophageal cancer Neg Hx   . Liver cancer Neg Hx   . Rectal cancer Neg Hx   . Stomach cancer Neg Hx     Past Surgical History:  Procedure Laterality Date  . ABDOMINAL HYSTERECTOMY  1993  . adenosin cardiolite  07/2005   (+) wall motion abnormality  . AXILLARY LYMPH NODE DISSECTION  2003   squamous cell cancer  . BACK SURGERY    . CARDIAC CATHETERIZATION  ? 2005 / 2007  . CHOLECYSTECTOMY N/A 01/24/2018   Procedure: LAPAROSCOPIC CHOLECYSTECTOMY;  Surgeon: Erroll Luna, MD;  Location: [facility];  Service: General;  Laterality: N/A;  . CT abdomen and pelvis  02/2010   Same  . ESOPHAGOGASTRODUODENOSCOPY  2005   GERD  . KNEE ARTHROSCOPY  1990's   "right" (03/20/2012)  . LUMBAR FUSION N/A 08/22/2014   Procedure: Right L2-3  and right L1-2 transforaminal lumbar interbody fusion with pedicle screws, rods, sleeves, cages, local bone graft, Vivigen, cancellous chips;  Surgeon: Jessy Oto, MD;  Location: [facility];  Service: Orthopedics;  Laterality: N/A;  . LUMBAR LAMINECTOMY/DECOMPRESSION MICRODISCECTOMY N/A 02/04/2014   Procedure: RIGHT L2-3 MICRODISCECTOMY ;  Surgeon: Jessy Oto, MD;  Location: [facility];  Service: Orthopedics;  Laterality: N/A;  . MASTECTOMY Right   . MASTECTOMY WITH AXILLARY LYMPH NODE DISSECTION  12/2003   "right" (03/20/2012)  . POSTERIOR CERVICAL FUSION/FORAMINOTOMY  2001  . SHOULDER ARTHROSCOPY W/ ROTATOR CUFF REPAIR  2003   Left  . THYROIDECTOMY  1977   Right  . TOTAL KNEE ARTHROPLASTY  2000   Right  . [place] of abdomen  07/2005   Fatty liver / kidney stones   Social History   Occupational History  . Occupation: Retired since 2003  Tobacco Use  . Smoking status: Never Smoker  . Smokeless tobacco: Never Used  Vaping Use  . Vaping Use: Never used  Substance and Sexual Activity  . Alcohol use: No  . Drug use: No  . Sexual activity: Not on file

## 2019-12-02 ENCOUNTER — Other Ambulatory Visit: Payer: Self-pay

## 2019-12-02 DIAGNOSIS — E1122 Type 2 diabetes mellitus with diabetic chronic kidney disease: Secondary | ICD-10-CM | POA: Diagnosis not present

## 2019-12-02 DIAGNOSIS — I272 Pulmonary hypertension, unspecified: Secondary | ICD-10-CM | POA: Diagnosis not present

## 2019-12-02 DIAGNOSIS — I5032 Chronic diastolic (congestive) heart failure: Secondary | ICD-10-CM | POA: Diagnosis not present

## 2019-12-02 DIAGNOSIS — R768 Other specified abnormal immunological findings in serum: Secondary | ICD-10-CM | POA: Diagnosis not present

## 2019-12-02 DIAGNOSIS — I129 Hypertensive chronic kidney disease with stage 1 through stage 4 chronic kidney disease, or unspecified chronic kidney disease: Secondary | ICD-10-CM | POA: Diagnosis not present

## 2019-12-02 DIAGNOSIS — R809 Proteinuria, unspecified: Secondary | ICD-10-CM | POA: Diagnosis not present

## 2019-12-02 DIAGNOSIS — N179 Acute kidney failure, unspecified: Secondary | ICD-10-CM | POA: Diagnosis not present

## 2019-12-02 DIAGNOSIS — I700 Atherosclerosis of aorta: Secondary | ICD-10-CM | POA: Diagnosis not present

## 2019-12-02 DIAGNOSIS — N1831 Chronic kidney disease, stage 3a: Secondary | ICD-10-CM | POA: Diagnosis not present

## 2019-12-02 NOTE — Patient Outreach (Addendum)
[facility]) Care Management  12/02/2019  ARNELLA PRALLE 05/15/1950 [id number]   Telephone Assessment    Outreach attempt #1 to patient. Spoke with patient who voices she is doing fairly well. She shares her recent ortho appt. She was told she has "brittle bones." Patient was taking Vitamin C & D supplements a while ago but was told to stop due to her labs being elevated. MD office is to follow up with her regarding rather or not to resume med. She reports that her bones are "weak" and she may need hip surgery. She goes for further workup and eval soon. She states that she went to kidney MD appt this morning and visit went well. She is due to see PCP next month. Patient states appetite has been WNL for her. She continues to drink Glucerna as needed but not every day. Blood sugar this morning was 107. Patient has appt with endocrinologist this month and then plans to change to another specialist. She will speak with MD about getting new meter as well. Patient reports she did not hear from pharmacist regarding referral RN CM placed last month.  She denies any RN CM needs or concerns at this time.     Plan: RN CM discussed with patient next outreach within the month of  Sept. Patient gave verbal consent and in agreement with RN CM follow up and timeframe. Patient aware that they may contact RN CM sooner for any issues or concerns. RN CM will re-send [facility] pharmacy referral.  Enzo Montgomery, RN,BSN,CCM [place] Management Telephonic Care Management Coordinator Direct Phone: [phone] Toll Free: [phone] Fax: [phone]

## 2019-12-02 NOTE — Patient Outreach (Signed)
Received a Pharmacy referral.  Sent referral to [name], Dr. Virgil Benedict [facility] Pharmacist though secured email.

## 2019-12-05 ENCOUNTER — Encounter: Payer: Self-pay | Admitting: "Endocrinology

## 2019-12-05 ENCOUNTER — Ambulatory Visit (INDEPENDENT_AMBULATORY_CARE_PROVIDER_SITE_OTHER): Payer: Medicare HMO | Admitting: "Endocrinology

## 2019-12-05 ENCOUNTER — Other Ambulatory Visit: Payer: Self-pay

## 2019-12-05 VITALS — BP 130/66 | HR 60 | Ht 64.0 in | Wt 215.4 lb

## 2019-12-05 DIAGNOSIS — E890 Postprocedural hypothyroidism: Secondary | ICD-10-CM | POA: Diagnosis not present

## 2019-12-05 DIAGNOSIS — E1159 Type 2 diabetes mellitus with other circulatory complications: Secondary | ICD-10-CM

## 2019-12-05 DIAGNOSIS — E559 Vitamin D deficiency, unspecified: Secondary | ICD-10-CM | POA: Diagnosis not present

## 2019-12-05 DIAGNOSIS — I10 Essential (primary) hypertension: Secondary | ICD-10-CM

## 2019-12-05 LAB — POCT GLYCOSYLATED HEMOGLOBIN (HGB A1C): Hemoglobin A1C: 6.9 % — AB (ref 4.0–5.6)

## 2019-12-05 MED ORDER — DEXCOM G6 SENSOR MISC: 4.0000 | 2 refills | Status: DC

## 2019-12-05 MED ORDER — DEXCOM G6 RECEIVER DEVI: 1.0000 | 0 refills | Status: DC | PRN

## 2019-12-05 MED ORDER — DEXCOM G6 TRANSMITTER MISC
1.0000 | 1 refills | Status: DC
Start: 2019-12-05 — End: 2020-08-26

## 2019-12-05 NOTE — Patient Instructions (Signed)

## 2019-12-05 NOTE — Progress Notes (Signed)
12/05/2019, 1:03 PM   Endocrinology follow-up note   Subjective:    Patient ID: [name], female    DOB: May 23, 1950.  LAMYIA CDEBACA is being seen in follow-up for management of currently uncontrolled,complicated symptomatic type 2 diabetes and associated hypertension, hypothyroidism. PMD :  [name], MD.   Past Medical History:  Diagnosis Date  . Anginal pain ([place]) 03/19/2012   saw Dr. Cathie Olden .. she thinks its more related to stomach issues  . Arthritis    "all over; mostly in my back" (03/20/2012)  . Breast cancer ([place])    "right" (03/20/2012)  . Cardiomyopathy   . Chest pain 06/2005   Hospitalized, dystolic dysfunction  . Chronic lower back pain    "have 2 herniated discs; going to have to have a fusion" (03/20/2012)  . CKD (chronic kidney disease), stage III   . Family history of anesthesia complication    "father has nausea and vomiting"  . Gallstones   . GERD (gastroesophageal reflux disease)   . H/O hiatal hernia   . History of kidney stones   . History of right mastectomy   . Hypertension    "patient states never had HTN, takes Hyzaar for heart  . Hypothyroidism   . Irritable bowel syndrome   . Migraines    "it's been a long time since I've had one" (03/20/2012)  . Osteoarthritis   . Peripheral neuropathy   . PONV (postoperative nausea and vomiting)   . Sciatica   . Type II diabetes mellitus ([place])    Past Surgical History:  Procedure Laterality Date  . ABDOMINAL HYSTERECTOMY  1993  . adenosin cardiolite  07/2005   (+) wall motion abnormality  . AXILLARY LYMPH NODE DISSECTION  2003   squamous cell cancer  . BACK SURGERY    . CARDIAC CATHETERIZATION  ? 2005 / 2007  . CHOLECYSTECTOMY N/A 01/24/2018   Procedure: LAPAROSCOPIC CHOLECYSTECTOMY;  Surgeon: Erroll Luna, MD;  Location: [facility];  Service: General;  Laterality: N/A;  . CT abdomen and pelvis   02/2010   Same  . ESOPHAGOGASTRODUODENOSCOPY  2005   GERD  . KNEE ARTHROSCOPY  1990's   "right" (03/20/2012)  . LUMBAR FUSION N/A 08/22/2014   Procedure: Right L2-3 and right L1-2 transforaminal lumbar interbody fusion with pedicle screws, rods, sleeves, cages, local bone graft, Vivigen, cancellous chips;  Surgeon: Jessy Oto, MD;  Location: [facility];  Service: Orthopedics;  Laterality: N/A;  . LUMBAR LAMINECTOMY/DECOMPRESSION MICRODISCECTOMY N/A 02/04/2014   Procedure: RIGHT L2-3 MICRODISCECTOMY ;  Surgeon: Jessy Oto, MD;  Location: [facility];  Service: Orthopedics;  Laterality: N/A;  . MASTECTOMY Right   . MASTECTOMY WITH AXILLARY LYMPH NODE DISSECTION  12/2003   "right" (03/20/2012)  . POSTERIOR CERVICAL FUSION/FORAMINOTOMY  2001  . SHOULDER ARTHROSCOPY W/ ROTATOR CUFF REPAIR  2003   Left  . THYROIDECTOMY  1977   Right  . TOTAL KNEE ARTHROPLASTY  2000   Right  . [place] of abdomen  07/2005   Fatty liver / kidney stones   Social History   Socioeconomic History  . Marital status: Married    Spouse name: Not on  file  . Number of children: 1  . Years of education: Not on file  . Highest education level: Not on file  Occupational History  . Occupation: Retired since 2003  Tobacco Use  . Smoking status: Never Smoker  . Smokeless tobacco: Never Used  Vaping Use  . Vaping Use: Never used  Substance and Sexual Activity  . Alcohol use: No  . Drug use: No  . Sexual activity: Not on file  Other Topics Concern  . Not on file  Social History Narrative  . Not on file   Social Determinants of Health   Financial Resource Strain:   . Difficulty of Paying Living Expenses: Not on file  Food Insecurity: No Food Insecurity  . Worried About Charity fundraiser in the Last Year: Never true  . Ran Out of Food in the Last Year: Never true  Transportation Needs: No Transportation Needs  . Lack of Transportation (Medical): No  . Lack of Transportation (Non-Medical): No  Physical Activity:    . Days of Exercise per Week: Not on file  . Minutes of Exercise per Session: Not on file  Stress:   . Feeling of Stress : Not on file  Social Connections:   . Frequency of Communication with Friends and Family: Not on file  . Frequency of Social Gatherings with Friends and Family: Not on file  . Attends Religious Services: Not on file  . Active Member of Clubs or Organizations: Not on file  . Attends Archivist Meetings: Not on file  . Marital Status: Not on file   Outpatient Encounter Medications as of 12/05/2019  Medication Sig  . acetaminophen (TYLENOL) 500 MG tablet Take 1,000 mg by mouth every 6 (six) hours as needed for moderate pain or headache.  . Alcohol Swabs (B-D SINGLE USE SWABS REGULAR) PADS Use as directed.  [name] Kitchen amLODipine (NORVASC) 10 MG tablet TAKE 1 TABLET BY MOUTH EVERY DAY (Patient taking differently: Take 10 mg by mouth daily. )  . aspirin 81 MG tablet Take 81 mg by mouth daily.  . Blood Glucose Calibration (TRUE METRIX LEVEL 2) Normal SOLN Use as directed  . Blood Glucose Monitoring Suppl (TRUE METRIX AIR GLUCOSE METER) w/Device KIT 1 kit by Does not apply route 3 (three) times daily.  . carvedilol (COREG) 6.25 MG tablet Take 1 tablet (6.25 mg total) by mouth 2 (two) times daily with a meal.  . Continuous Blood Gluc Receiver (DEXCOM G6 RECEIVER) DEVI 1 Piece by Does not apply route as needed.  . Continuous Blood Gluc Sensor (DEXCOM G6 SENSOR) MISC 4 Pieces by Does not apply route once a week.  . Continuous Blood Gluc Transmit (DEXCOM G6 TRANSMITTER) MISC 1 Piece by Does not apply route as directed.  . cyclobenzaprine (FLEXERIL) 5 MG tablet Take 1 tablet (5 mg total) by mouth 2 (two) times daily as needed for muscle spasms.  . diclofenac Sodium (VOLTAREN) 1 % GEL Apply 4 g topically 4 (four) times daily as needed. (Patient taking differently: Apply 4 g topically 4 (four) times daily as needed (pain). )  . ferrous gluconate (FERGON) 324 MG tablet TAKE 1 TABLET  (324 MG TOTAL) BY MOUTH 2 (TWO) TIMES DAILY WITH A MEAL.  . furosemide (LASIX) 40 MG tablet TAKE 1 TABLET (40 MG TOTAL) BY MOUTH DAILY AS NEEDED (LEG SWELLING). (Patient taking differently: Take 40 mg by mouth daily as needed (leg swelling). Patient taking 31m daily)  . glipiZIDE (GLUCOTROL XL) 5 MG 24 hr  tablet TAKE 1 TABLET BY MOUTH EVERY DAY WITH BREAKFAST (Patient taking differently: Take 5 mg by mouth daily with breakfast. )  . glucose blood (TRUE METRIX BLOOD GLUCOSE TEST) test strip Use as instructed  . hydrALAZINE (APRESOLINE) 50 MG tablet Patient taking 46m 3x/day  . HYDROcodone-acetaminophen (NORCO/VICODIN) 5-325 MG tablet Take 1 tablet by mouth every 6 (six) hours as needed for moderate pain.  .[name] Kitcheninsulin NPH-regular Human (70-30) 100 UNIT/ML injection Inject 40 Units into the skin See admin instructions. 40 units with breakfast and 40 units with supper for BG above 90  . levothyroxine (SYNTHROID) 100 MCG tablet Take 1 tablet (100 mcg total) by mouth daily.  . metoCLOPramide (REGLAN) 10 MG tablet Take 1 tablet (10 mg total) by mouth 4 (four) times daily. (Patient not taking: Reported on 10/29/2019)  . ondansetron (ZOFRAN) 4 MG tablet Take 4 mg by mouth every 8 (eight) hours as needed for nausea or vomiting.  . pantoprazole (PROTONIX) 40 MG tablet Take 1 tablet (40 mg total) by mouth 2 (two) times daily. (Patient taking differently: Take 40 mg by mouth 2 (two) times daily as needed (acid reflux). )  . Polyethyl Glycol-Propyl Glycol (SYSTANE) 0.4-0.3 % SOLN Apply to eye. Taking 2x/day  . polyethylene glycol (MIRALAX / GLYCOLAX) packet Take 17 g by mouth daily as needed for moderate constipation.  . rosuvastatin (CRESTOR) 20 MG tablet Take 1 tablet (20 mg total) by mouth daily at 6 PM.  . traMADol (ULTRAM) 50 MG tablet Take 50 mg by mouth every 6 (six) hours as needed. 1-2tabs qhrs prn  . TRUEplus Lancets 33G MISC Use as directed   Facility-Administered Encounter Medications as of 12/05/2019   Medication  . 0.9 %  sodium chloride infusion    ALLERGIES: Allergies  Allergen Reactions  . Dilaudid [Hydromorphone Hcl] Nausea And Vomiting  . Hydromorphone   . Lipitor [Atorvastatin] Other (See Comments)    Body & Muscle Aches  . Adhesive [Tape] Rash  . Ampicillin Rash and Other (See Comments)    REACTION: Rash and welts on her hands when she took it in the hospital  . Latex Itching, Dermatitis, Rash and Other (See Comments)    REACTION: Patient had a reaction to tape after surgery and they told her she was allergic to Latex   . Penicillins Rash and Other (See Comments)    Has patient had a PCN reaction causing immediate rash, facial/tongue/throat swelling, SOB or lightheadedness with hypotension: No Has patient had a PCN reaction causing severe rash involving mucus membranes or skin necrosis: No Has patient had a PCN reaction that required hospitalization: Yes - in hospital receiving treatment Has patient had a PCN reaction occurring within the last 10 years: No If all of the above answers are "NO", then may proceed with Cephalosporin use.     VACCINATION STATUS: Immunization History  Administered Date(s) Administered  . Influenza Split 01/31/2011  . Influenza Whole 02/23/2007  . Influenza, High Dose Seasonal PF 01/10/2017  . Influenza,inj,Quad PF,6+ Mos 01/07/2014, 02/09/2016, 01/01/2018, 12/03/2018  . Influenza-Unspecified 01/10/2012  . PFIZER SARS-COV-2 Vaccination 05/24/2019, 06/18/2019  . Pneumococcal Conjugate-13 05/16/2019  . Pneumococcal Polysaccharide-23 08/25/2014  . Tdap 04/20/2011    Diabetes She presents for her follow-up diabetic visit. She has type 2 diabetes mellitus. Onset time: She was diagnosed at approximate age of 89years. Her disease course has been improving. There are no hypoglycemic associated symptoms. Pertinent negatives for hypoglycemia include no confusion, headaches, pallor or seizures. Pertinent negatives for diabetes  include no blurred  vision, no chest pain, no fatigue, no foot ulcerations, no polydipsia, no polyphagia and no polyuria. There are no hypoglycemic complications. Symptoms are improving. Diabetic complications include nephropathy and retinopathy. Risk factors for coronary artery disease include diabetes mellitus, hypertension, obesity, sedentary lifestyle and post-menopausal. Current diabetic treatment includes insulin injections (She is currently on Novolin 70/30 65 units a.m., 35 units p.m. along with metformin 850 mg p.o. twice daily.). Her weight is decreasing steadily. She is following a generally unhealthy diet. When asked about meal planning, she reported none. She has not had a previous visit with a dietitian. She never participates in exercise. Her home blood glucose trend is decreasing steadily. Her breakfast blood glucose range is generally 140-180 mg/dl. Her dinner blood glucose range is generally 180-200 mg/dl. Her overall blood glucose range is 180-200 mg/dl. (She presents with an incomplete log of glycemic profile and insulin administration.  Her point-of-care A1c is 8.4%, overall improving from 10.3%.  She did not have any hypoglycemia. ) An ACE inhibitor/angiotensin II receptor blocker is being taken. Eye exam is current.  Hypertension This is a chronic problem. The current episode started more than 1 year ago. The problem is uncontrolled. Pertinent negatives include no blurred vision, chest pain, headaches, palpitations or shortness of breath. Risk factors for coronary artery disease include diabetes mellitus, obesity, sedentary lifestyle, dyslipidemia and family history. Past treatments include angiotensin blockers. Hypertensive end-organ damage includes retinopathy. Identifiable causes of hypertension include a thyroid problem.  Thyroid Problem Presents for follow-up visit. Onset time: She underwent what appears to be total thyroidectomy in her 95s for reportedly benign goiter.  Has taken thyroid hormone ever  since, currently levothyroxine 100 mcg p.o. nightly. Patient reports no cold intolerance, diarrhea, fatigue, heat intolerance or palpitations. The symptoms have been stable. Past treatments include levothyroxine. Prior procedures include thyroidectomy.     Review of systems  Constitutional: + Minimally fluctuating body weight,  current  Body mass index is 36.97 kg/m. , no fatigue, no subjective hyperthermia, no subjective hypothermia Eyes: no blurry vision, no xerophthalmia ENT: no sore throat, no nodules palpated in throat, no dysphagia/odynophagia, no hoarseness Cardiovascular: no Chest Pain, no Shortness of Breath, no palpitations, no leg swelling Respiratory: no cough, no shortness of breath Gastrointestinal: no Nausea/Vomiting/Diarhhea Musculoskeletal: + She ambulates with a cane, no muscle/joint aches Skin: no rashes, no hyperemia Neurological: no tremors, no numbness, no tingling, no dizziness Psychiatric: no depression, no anxiety    Objective:    BP 130/66   Pulse 60   Ht '5\' 4"'  (1.626 m)   Wt 215 lb 6.4 oz (97.7 kg)   BMI 36.97 kg/m   Wt Readings from Last 3 Encounters:  12/05/19 215 lb 6.4 oz (97.7 kg)  11/29/19 210 lb (95.3 kg)  11/04/19 (!) 210 lb (95.3 kg)      Physical Exam- Limited  Constitutional:  Body mass index is 36.97 kg/m. , not in acute distress, normal state of mind Eyes:  EOMI, no exophthalmos Neck: Supple Thyroid: No gross goiter Respiratory: Adequate breathing efforts Musculoskeletal: no gross deformities, strength intact in all four extremities, no gross restriction of joint movements Skin:  no rashes, no hyperemia Neurological: no tremor with outstretched hands    CMP Latest Ref Rng & Units 10/28/2019 10/19/2019 09/19/2019  Glucose 70 - 99 mg/dL 195(H) 109(H) 144(H)  BUN 6 - 23 mg/dL 34(H) 46(H) 36(H)  Creatinine 0.40 - 1.20 mg/dL 1.68(H) 2.25(H) 1.64(H)  Sodium 135 - 145 mEq/L 140 139 137  Potassium 3.5 - 5.1 mEq/L 4.9 5.1 4.9   Chloride 96 - 112 mEq/L 107 106 110  CO2 19 - 32 mEq/L 25 24 20(L)  Calcium 8.4 - 10.5 mg/dL 10.7(H) 10.0 10.6(H)  Total Protein 6.5 - 8.1 g/dL - - 7.3  Total Bilirubin 0.3 - 1.2 mg/dL - - 0.3  Alkaline Phos 38 - 126 U/L - - 65  AST 15 - 41 U/L - - 25  ALT 0 - 44 U/L - - 21     Diabetic Labs (most recent): Lab Results  Component Value Date   HGBA1C 6.9 (A) 12/05/2019   HGBA1C 8.4 (H) 08/05/2019   HGBA1C 8.9 (A) 07/03/2019     Lipid Panel ( most recent) Lipid Panel     Component Value Date/Time   CHOL 155 08/05/2019 1515   TRIG 269 (H) 08/05/2019 1515   HDL 41 (L) 08/05/2019 1515   CHOLHDL 3.8 08/05/2019 1515   VLDL 42 (H) 04/17/2019 0227   LDLCALC 79 08/05/2019 1515      Assessment & Plan:   1. DM type 2 causing vascular disease ([place])  - JOYLENE WESCOTT has currently uncontrolled symptomatic type 2 DM since 69 years of age.   She presents with significantly improving and controlled glycemic profile with some rare random hypoglycemia.  Her point-of-care A1c 6.9%, overall improving from 10.3%.   -her diabetes is complicated by retinopathy, obesity/sedentary life, and she remains at a high risk for more acute and chronic complications which include CAD, CVA, CKD, retinopathy, and neuropathy. These are all discussed in detail with her.  - I have counseled her on diet management and weight loss, by adopting a carbohydrate restricted/protein rich diet.  - she  admits there is a room for improvement in her diet and drink choices. -  Suggestion is made for her to avoid simple carbohydrates  from her diet including Cakes, Sweet Desserts / Pastries, Ice Cream, Soda (diet and regular), Sweet Tea, Candies, Chips, Cookies, Sweet Pastries,  Store Bought Juices, Alcohol in Excess of  1-2 drinks a day, Artificial Sweeteners, Coffee Creamer, and "Sugar-free" Products. This will help patient to have stable blood glucose profile and potentially avoid unintended weight gain.   - I  encouraged her to switch to  unprocessed or minimally processed complex starch and increased protein intake (animal or plant source), fruits, and vegetables.  - she is advised to stick to a routine mealtimes to eat 3 meals  a day and avoid unnecessary snacks ( to snack only to correct hypoglycemia).   - she is following with Jearld Fenton, RDN, CDE for individualized diabetes education.  - I have approached her with the following individualized plan to manage diabetes and patient agrees:   -She will continue to benefit from her current simplified insulin treatment regimen.   -She is encouraged to stay engaged for proper monitoring and safe use of insulin. -I discussed and ordered the Dexcom CGM device for him. -She is advised to lower her Novolin 70/30 to 40 units with breakfast and 40 units with supper  for pre-meal blood glucose readings above 90 mg/dL,  associated with strict monitoring of glucose 4 times a day-before meals and at bedtime .  - she is warned not to take insulin without proper monitoring per orders. - she is encouraged to call clinic for blood glucose levels less than 70 or above 300 mg /dl. -She was recently taken off of Metformin due to CKD.  She is benefiting from low-dose glipizide,  advised to continue  glipizide 5 mg XL daily at breakfast.    - Patient specific target  A1c;  LDL, HDL, Triglycerides, were discussed in detail.  2) BP/HTN:  Her blood pressure is controlled to target.     she is advised to continue her current medications including losartan 50 mg p.o. daily at breakfast.    3) Lipids/HPL:   Review of her recent lipid panel showed uncontrolled  LDL at 96.  she has documented adverse reactions to Lipitor.  She will be considered for low-dose Niaspan on next visit.    4)  Weight/Diet: Her BMI 21.11- clearly complicating her diabetes care.  She is a candidate for modest weight loss.  I discussed with her the fact that loss of 5 - 10% of her  current body  weight will have the most impact on her diabetes management.  CDE Consult will be initiated . Exercise, and detailed carbohydrates information provided  -  detailed on discharge instructions.  5) hypothyroidism-postsurgical  Her recent thyroid function tests are consistent with appropriate replacement.  She is advised to continue levothyroxine 100 mcg p.o. daily before breakfast.     - We discussed about the correct intake of her thyroid hormone, on empty stomach at fasting, with water, separated by at least 30 minutes from breakfast and other medications,  and separated by more than 4 hours from calcium, iron, multivitamins, acid reflux medications (PPIs). -Patient is made aware of the fact that thyroid hormone replacement is needed for life, dose to be adjusted by periodic monitoring of thyroid function tests.    6) nodular goiter. -Her most recent thyroid/neck ultrasound revealed 2.9 cm nodule on the left lobe of her thyroid -She has surgically absent right thyroid lobe.  Biopsy of 2.9 cm nodule on the left lobe is benign.    She will not need any surgical intervention at this time.     7) vitamin D deficiency -She is on ongoing supplement with vitamin D3 5000 units daily.     8) Chronic Care/Health Maintenance:  -she  is on ARB and Statin medications and  is encouraged to initiate and continue to follow up with Ophthalmology, Dentist,  Podiatrist at least yearly or according to recommendations, and advised to  stay away from smoking. I have recommended yearly flu vaccine and pneumonia vaccine at least every 5 years; moderate intensity exercise for up to 150 minutes weekly; and  sleep for at least 7 hours a day.  - I advised patient to maintain close follow up with [name], MD for primary care needs.  - Time spent on this patient care encounter:  35 min, of which > 50% was spent in  counseling and the rest reviewing her blood glucose logs , discussing her hypoglycemia and  hyperglycemia episodes, reviewing her current and  previous labs / studies  ( including abstraction from other facilities) and medications  doses and developing a  long term treatment plan and documenting her care.   Please refer to Patient Instructions for Blood Glucose Monitoring and Insulin/Medications Dosing Guide"  in media tab for additional information. Please  also refer to " Patient Self Inventory" in the Media  tab for reviewed elements of pertinent patient history.  [name] participated in the discussions, expressed understanding, and voiced agreement with the above plans.  All questions were answered to her satisfaction. she is encouraged to contact clinic should she have any questions or concerns prior to her return visit.  Follow up plan: - Return in about 3 months (around 03/06/2020) for Bring Meter and Logs- A1c in Office, [place] with [place].  Glade Lloyd, MD [facility] [address] [place], [place] [address] Phone: [phone]  Fax: [phone]    12/05/2019, 1:03 PM  This note was partially dictated with voice recognition software. Similar sounding words can be transcribed inadequately or may not  be corrected upon review.

## 2019-12-08 IMAGING — CT CT HEAD W/O CM
3 series · 15 of 47 positions shown, 18 images · non-contrast
Comparison: None.

CLINICAL DATA: Acute headache

EXAM:
CT HEAD WITHOUT CONTRAST
TECHNIQUE: Contiguous axial images were obtained from the base of the skull
through the vertex without intravenous contrast.

[Series 2: head wo · axial · 0.41mm/px · z∈[+896,+1022]mm · 9 of 31 slices shown, 12 images]
[im 3/31  brain]
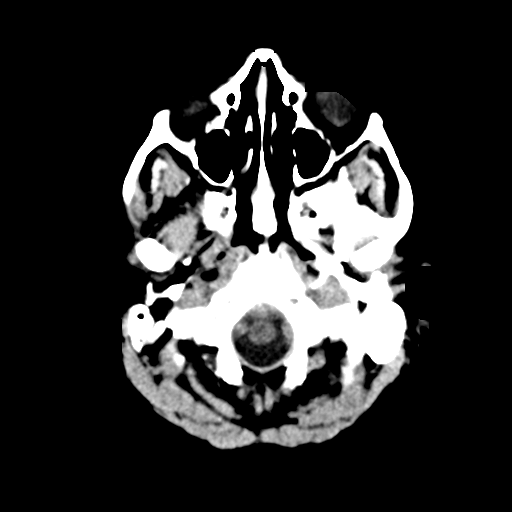
[im 3/31  bone]
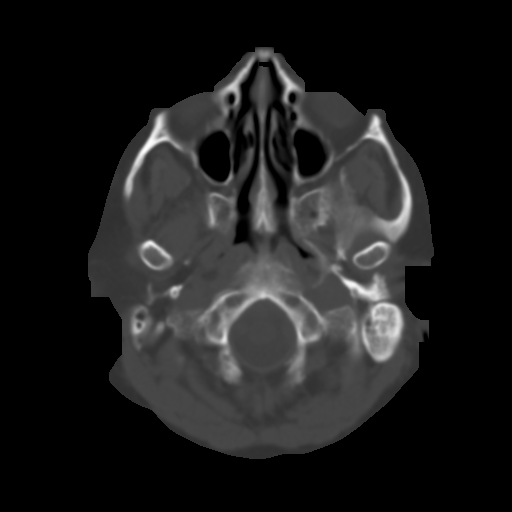
[im 6/31  brain]
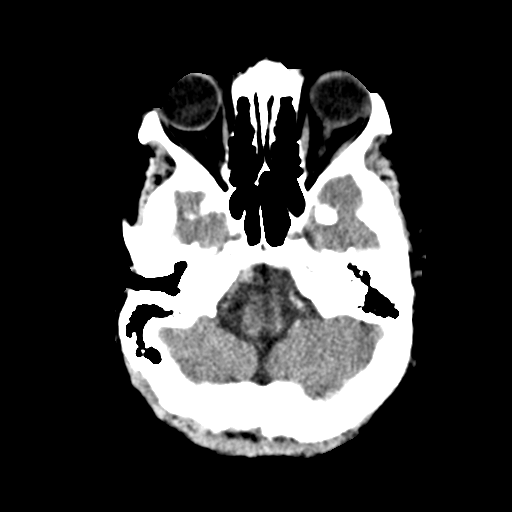
[im 9/31  brain]
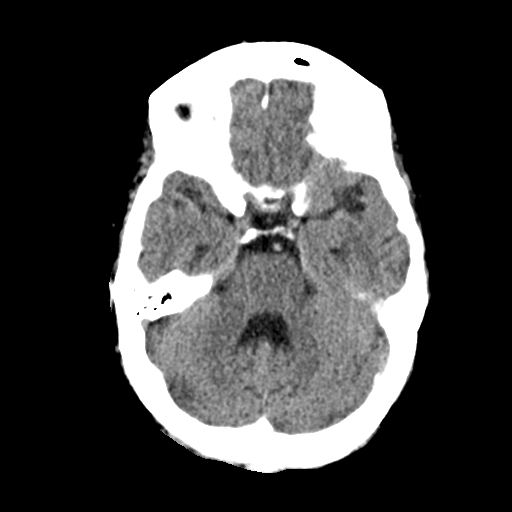
[im 12/31  brain]
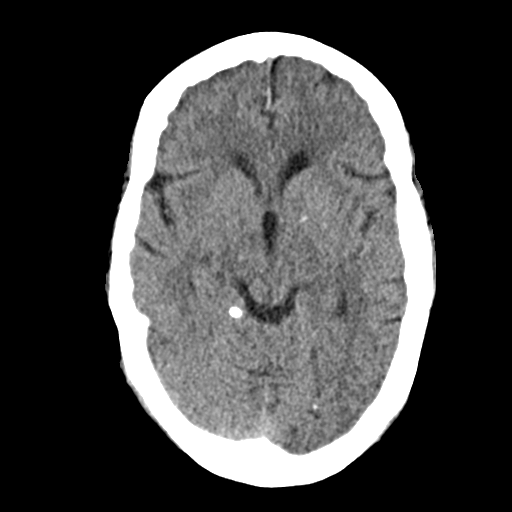
[im 16/31  brain]
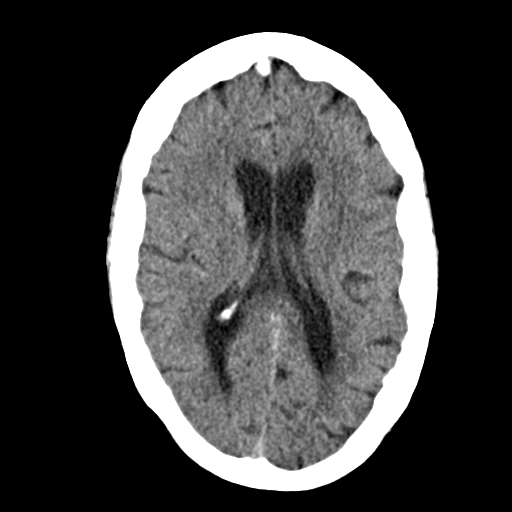
[im 16/31  bone]
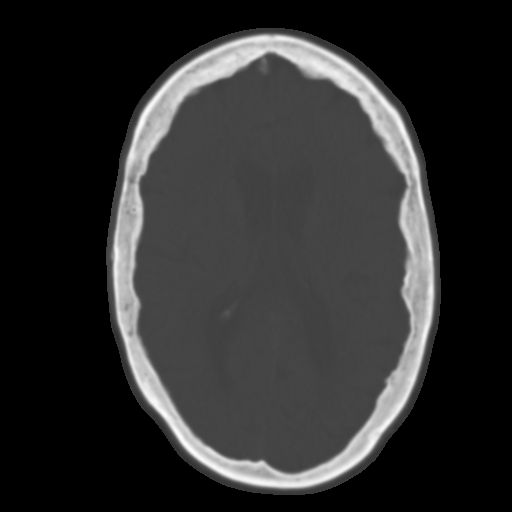
[im 19/31  brain]
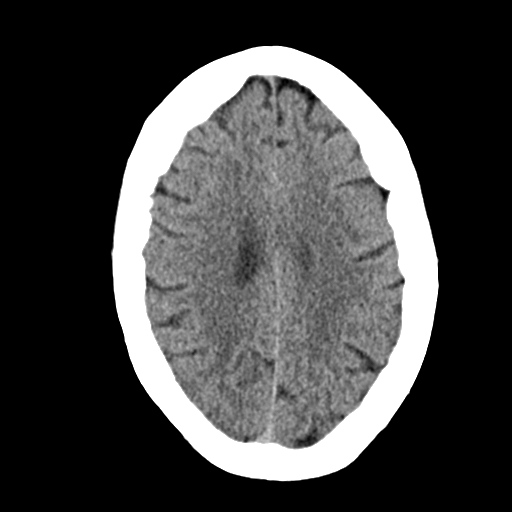
[im 22/31  brain]
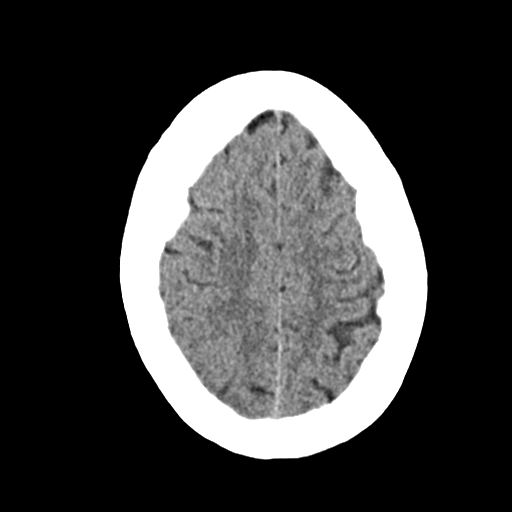
[im 25/31  brain]
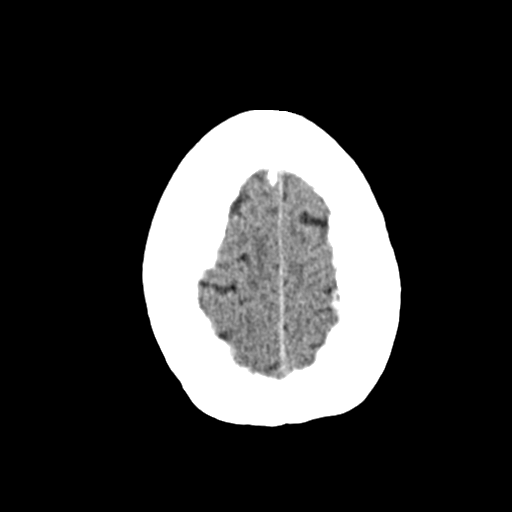
[im 28/31  brain]
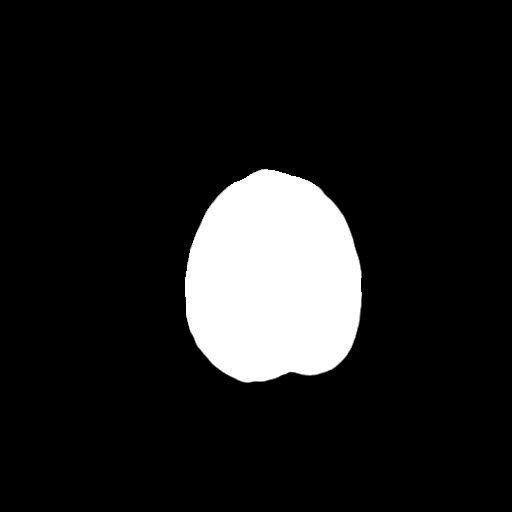
[im 28/31  bone]
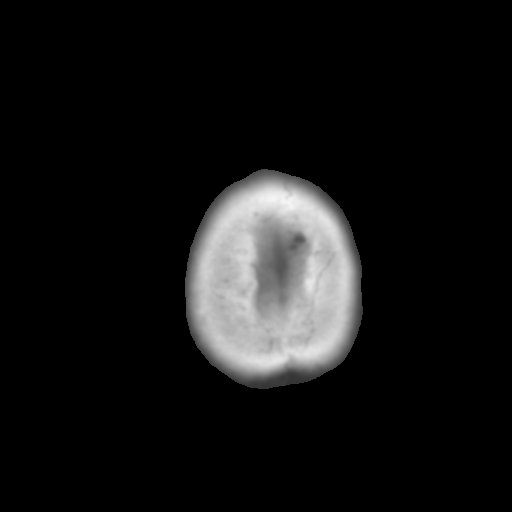

[Series 4: coronal soft · coronal · 0.29mm/px · 3 of 67 slices shown]
[im 23/67  brain]
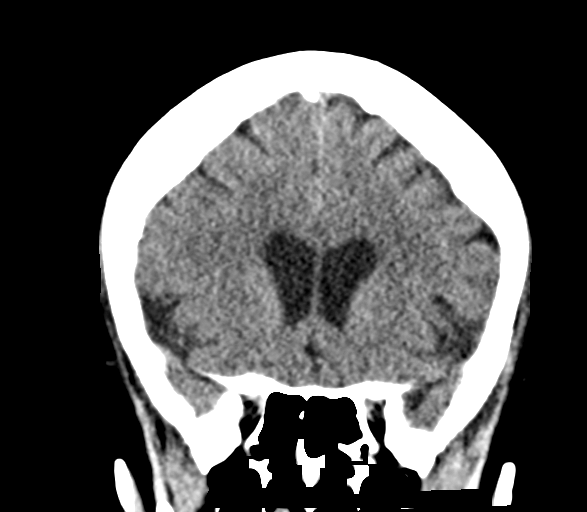
[im 30/67  brain]
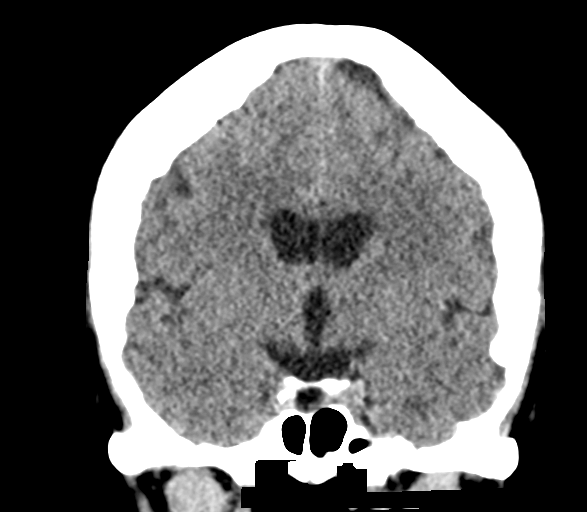
[im 37/67  brain]
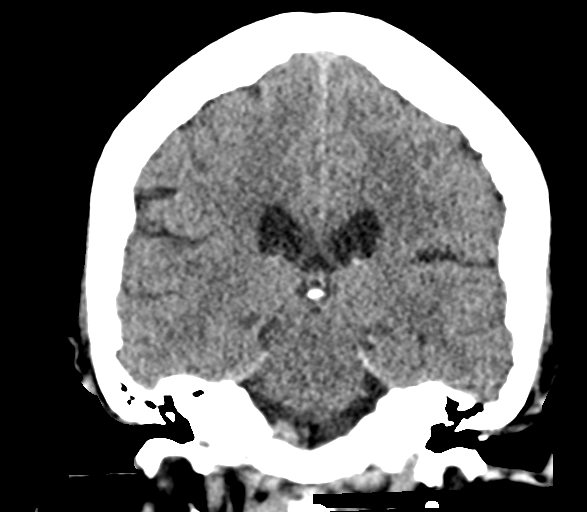

[Series 5: sag soft · sagittal · 0.29mm/px · 3 of 58 slices shown]
[im 20/58  brain]
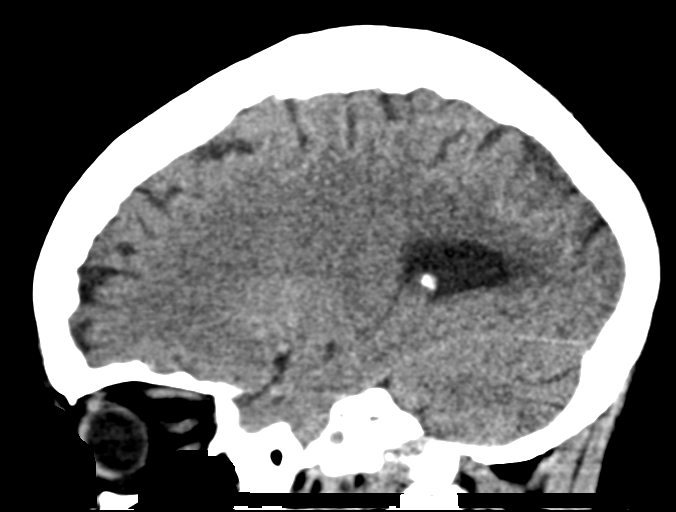
[im 29/58  brain]
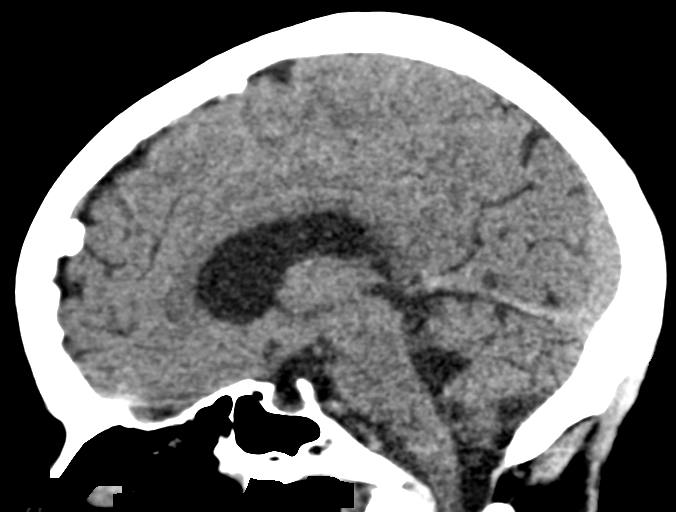
[im 39/58  brain]
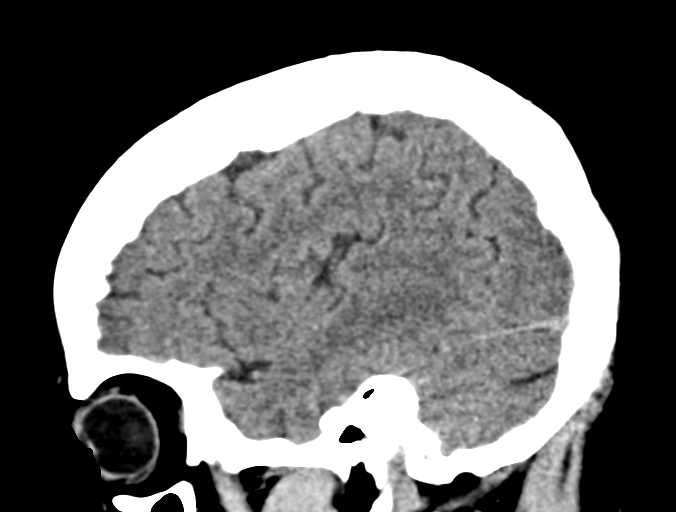

[15 of 47 positions shown; findings below may reference images not displayed]

FINDINGS: Brain: No mass lesion, intraparenchymal hemorrhage or extra-axial
collection. No evidence of acute cortical infarct. Normal appearance
of the brain parenchyma and extra axial spaces for age.

Vascular: No hyperdense vessel or unexpected vascular calcification.

Skull: Normal visualized skull base, calvarium and extracranial soft
tissues.

Sinuses/Orbits: No sinus fluid levels or advanced mucosal
thickening. No mastoid effusion. Normal orbits.
IMPRESSION: Normal head CT.

## 2019-12-09 ENCOUNTER — Ambulatory Visit: Payer: Self-pay

## 2019-12-09 IMAGING — MR MR MRA NECK WO/W CM
10 of 14 series · 26 of 48 positions shown · IV contrast (multihance)
Comparison: Prior head CT from 08/16/2017.

CLINICAL DATA: Initial evaluation for acute headache, dizziness.

EXAM:
MRI HEAD WITHOUT CONTRAST
MRA HEAD WITHOUT CONTRAST
MRA NECK WITHOUT AND WITH CONTRAST
TECHNIQUE: Multiplanar, multiecho pulse sequences of the brain and surrounding
structures were obtained without intravenous contrast. Angiographic
images of the Circle of Willis were obtained using MRA technique
without intravenous contrast. Angiographic images of the neck were
obtained using MRA technique without and with intravenous contrast.
Carotid stenosis measurements (when applicable) are obtained
utilizing NASCET criteria, using the distal internal carotid
diameter as the denominator.
CONTRAST:  20mL MULTIHANCE GADOBENATE DIMEGLUMINE 529 MG/ML IV SOLN

[Series 3: DWI · axial · 3.0mm · 0.94mm/px · z∈[-88,+54]mm · 5 of 98 slices shown (1 of 2)]
[im 1/98]
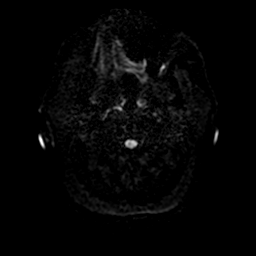
[im 25/98]
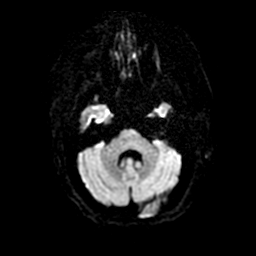
[im 49/98]
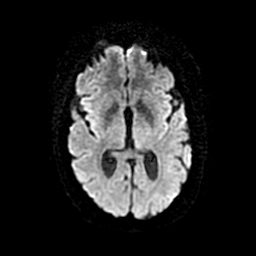
[im 73/98]
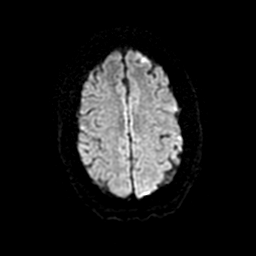
[im 98/98]
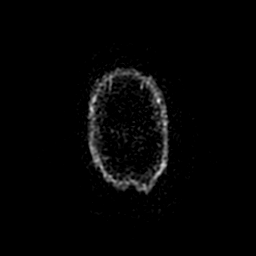

[Series 4: ax (id) 2 · axial · 1.0mm · 0.43mm/px · z∈[-62,+29]mm · 8 of 184 slices shown]
[im 1/184]
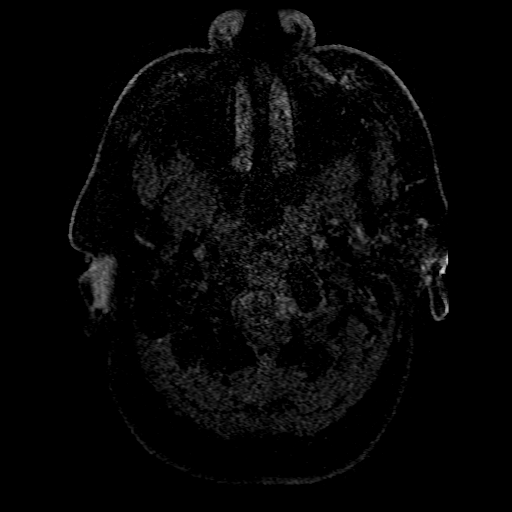
[im 23/184]
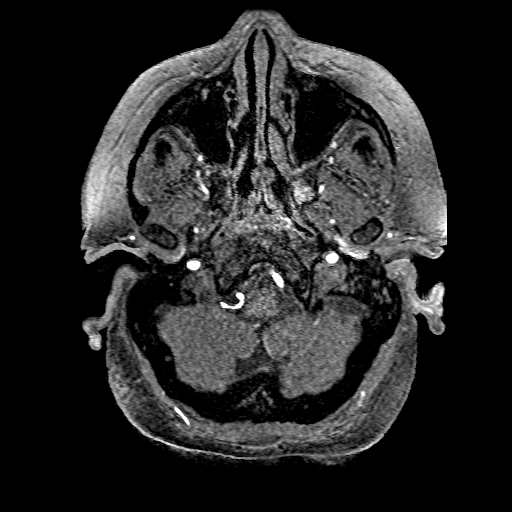
[im 46/184]
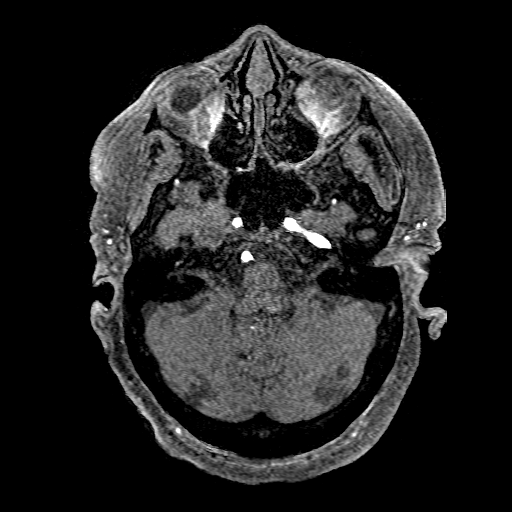
[im 69/184]
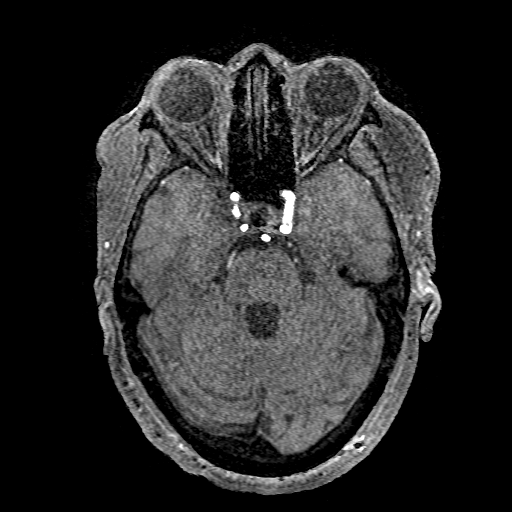
[im 115/184]
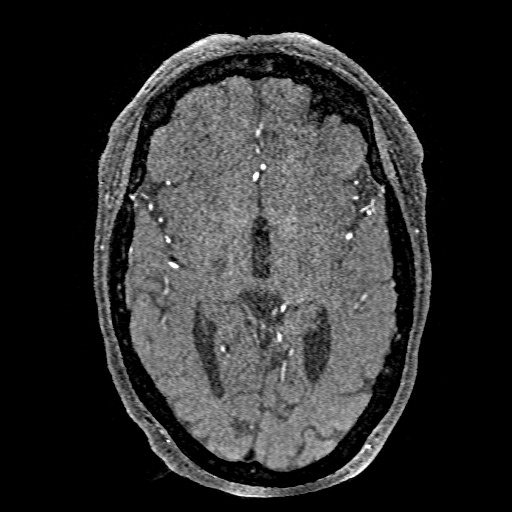
[im 138/184]
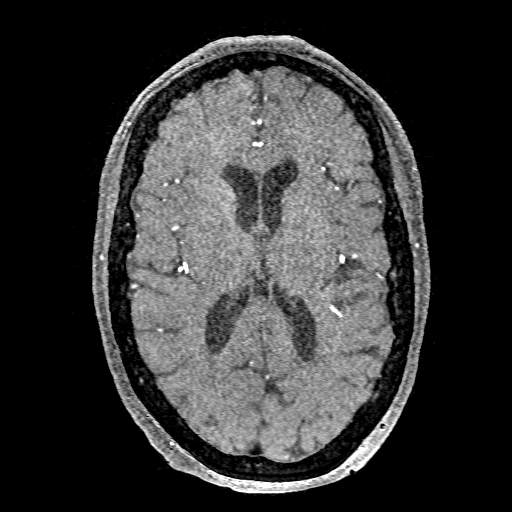
[im 161/184]
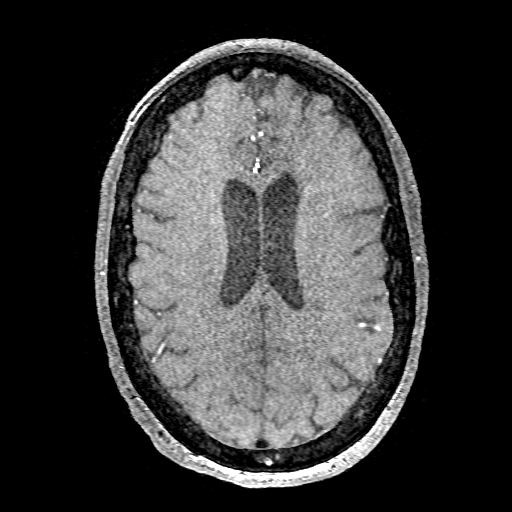
[im 184/184]
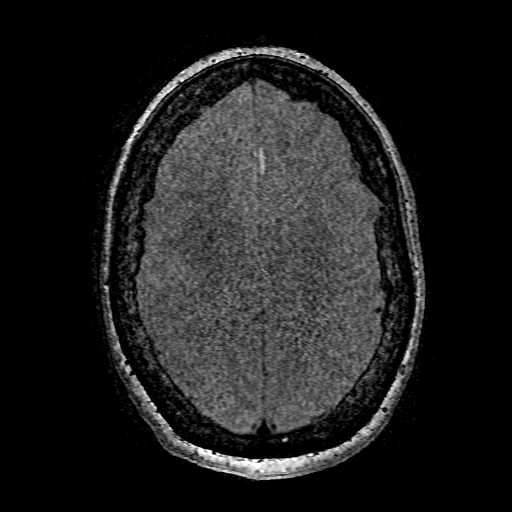

[Series 5: DWI · coronal · 4.0mm · 0.94mm/px · 4 of 72 slices shown (2 of 2)]
[im 1/72]
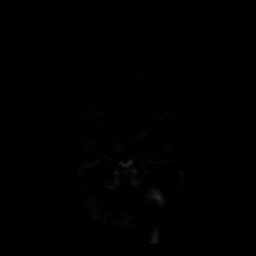
[im 24/72]
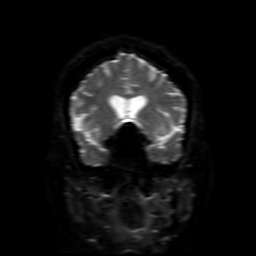
[im 48/72]
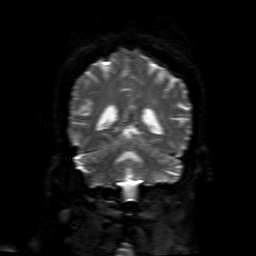
[im 72/72]
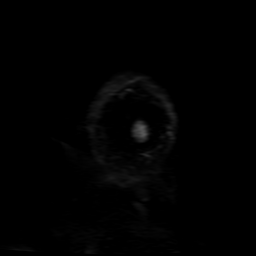

[Series 6: FLAIR · sagittal · 5.0mm · 0.47mm/px · 1 of 23 slices shown (1 of 2)]
[im 1/23]
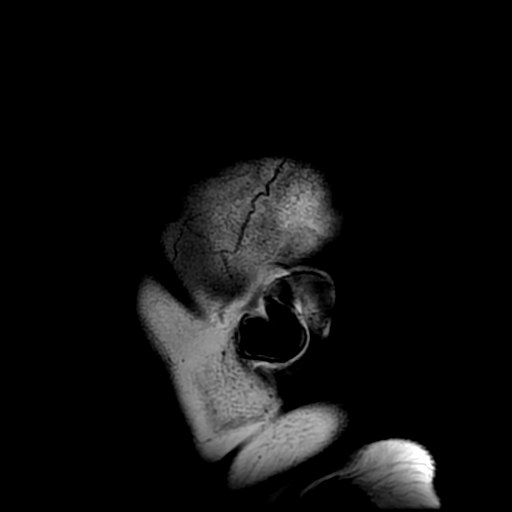

[Series 7: T2 · axial · 5.0mm · 0.43mm/px · 1 of 25 slices shown (1 of 2)]
[im 1/25]
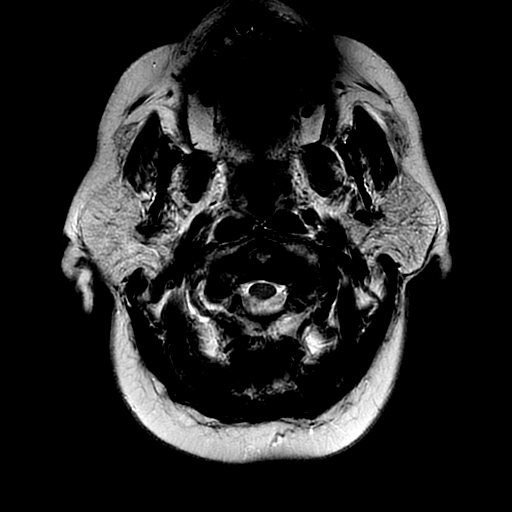

[Series 8: FLAIR · axial · 3.0mm · 0.43mm/px · 1 of 25 slices shown (2 of 2)]
[im 1/25]
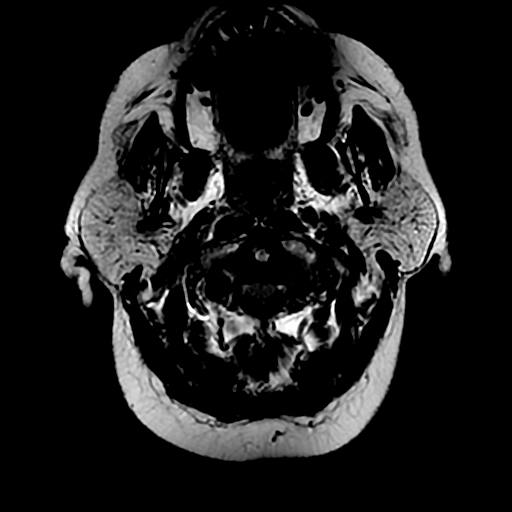

[Series 9: GRE · axial · 5.0mm · 0.43mm/px · 1 of 25 slices shown]
[im 1/25]
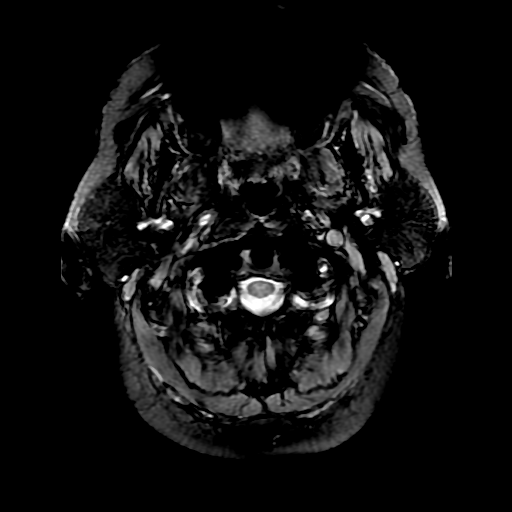

[Series 11: T2 · coronal · 5.0mm · 0.39mm/px · 1 of 30 slices shown (2 of 2)]
[im 1/30]
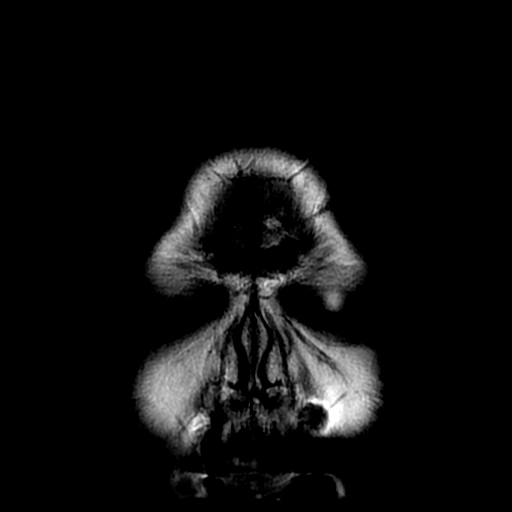

[Series 350: ADC · axial · 3.0mm · 0.94mm/px · z∈[-88,+54]mm · 2 of 49 slices shown (1 of 2)]
[im 1/49]
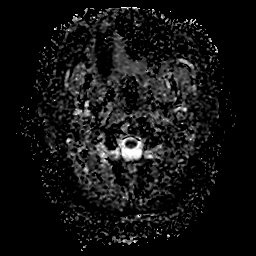
[im 49/49]
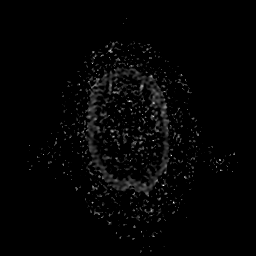

[Series 550: ADC · coronal · 4.0mm · 0.94mm/px · 2 of 36 slices shown (2 of 2)]
[im 1/36]
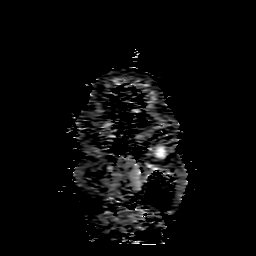
[im 36/36]
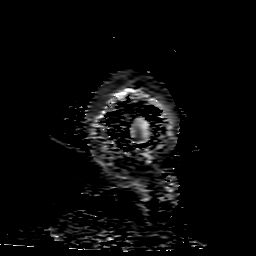

[26 of 48 positions shown; findings below may reference images not displayed]

FINDINGS: MRI HEAD FINDINGS

Generalized age-related cerebral atrophy. Patchy and confluent
T2/FLAIR hyperintensity within the periventricular and deep white
matter both cerebral hemispheres, most like related chronic small
ischemic changes, mild in nature. No abnormal foci of restricted
diffusion to suggest acute or subacute ischemia. Gray-white matter
differentiation maintained. No areas of remote cortical infarction.
No acute intracranial hemorrhage. No definite evidence for chronic
or prior intracranial hemorrhage.

No mass lesion, midline shift or mass effect. No hydrocephalus. No
extra-axial fluid collection. Vague FLAIR and susceptibility signal
overlying the parietal convexities bilaterally favored to be related
to overlying hyperostosis of the calvarium. No visible extra-axial
collection seen on prior head CT major dural sinuses grossly patent.

Incidental note made of a partially empty sella. Midline structures
intact.

Craniocervical junction within normal limits. Postsurgical changes
partially visualize within the upper cervical spine. No significant
stenosis. Bone marrow signal intensity within normal limits.
Hyperostosis frontalis interna noted. No scalp soft tissue
abnormality.

Globes and orbital soft tissues within normal limits. Patient status
post lens extraction bilaterally. Paranasal sinuses are largely
clear. No mastoid effusion. Inner ear structures grossly normal.

MRA HEAD FINDINGS

ANTERIOR CIRCULATION:

Internal carotid arteries patent to the termini without
flow-limiting stenosis. A1 segments, anterior communicating artery
and anterior cerebral arteries widely patent. M1 segments patent
without stenosis. Distal MCA branches well perfused and symmetric.

POSTERIOR CIRCULATION:

Vertebral arteries widely patent to the vertebrobasilar junction.
Right PICA patent. Dominant left AICA. Basilar widely patent to its
distal aspect without stenosis. Superior cerebral arteries patent
bilaterally. PCA supplied via the basilar and are well perfused to
their distal aspects.

No aneurysm.

MRA NECK FINDINGS

Source images reviewed.

Visualized aortic arch of normal caliber with normal branch pattern.
No flow-limiting stenosis about the origin of the great vessels.
Visualized subclavian arteries widely patent.

Right common and internal carotid arteries patent without stenosis
or occlusion. No significant atheromatous narrowing about the right
carotid bifurcation.

Left common and internal carotid arteries patent to the skull base
without hemodynamically significant stenosis or occlusion. Mild
narrowing of approximately 35% present at the origin of the left
ICA.

Both of the vertebral arteries arise from the subclavian arteries.
Vertebral arteries widely patent within the neck without stenosis or
occlusion.
IMPRESSION: MRI HEAD IMPRESSION

1. No acute intracranial abnormality.
2. Patchy T2/FLAIR hyperintensity involving the periventricular and
deep white matter both cerebral hemispheres, nonspecific, but most
like related chronic small vessel ischemic disease, mild in nature.

MRA HEAD IMPRESSION

Normal intracranial MRA.

MRA NECK IMPRESSION

1. Short-segment mild stenosis of approximately 35% at the origin of
the left ICA.
2. Otherwise negative MRA of the neck. No other high-grade or
critical flow limiting stenosis.

## 2019-12-13 ENCOUNTER — Encounter: Payer: Self-pay | Admitting: Family Medicine

## 2019-12-13 ENCOUNTER — Other Ambulatory Visit: Payer: Self-pay

## 2019-12-13 ENCOUNTER — Ambulatory Visit (INDEPENDENT_AMBULATORY_CARE_PROVIDER_SITE_OTHER): Payer: Medicare HMO | Admitting: Family Medicine

## 2019-12-13 VITALS — BP 118/68 | HR 78 | Temp 98.0°F | Resp 16 | Ht 64.0 in | Wt 209.2 lb

## 2019-12-13 DIAGNOSIS — Z23 Encounter for immunization: Secondary | ICD-10-CM | POA: Diagnosis not present

## 2019-12-13 DIAGNOSIS — M5137 Other intervertebral disc degeneration, lumbosacral region: Secondary | ICD-10-CM

## 2019-12-13 DIAGNOSIS — D582 Other hemoglobinopathies: Secondary | ICD-10-CM | POA: Insufficient documentation

## 2019-12-13 DIAGNOSIS — E1159 Type 2 diabetes mellitus with other circulatory complications: Secondary | ICD-10-CM | POA: Diagnosis not present

## 2019-12-13 DIAGNOSIS — E213 Hyperparathyroidism, unspecified: Secondary | ICD-10-CM | POA: Insufficient documentation

## 2019-12-13 NOTE — Patient Instructions (Signed)
Schedule your complete physical for March No need for labs today! Keep up the good work on healthy diet and regular exercise- your A1C looks great! Call with any questions or concerns Stay Safe!  Stay Healthy!

## 2019-12-13 NOTE — Assessment & Plan Note (Signed)
Has appt upcoming w/ Dr Kelton Pillar.  A1C is much better!  Applauded her efforts.  Will send record request to Dr Bing Plume to get results of eye exam.  Foot exam done today.

## 2019-12-13 NOTE — Assessment & Plan Note (Signed)
Pt is down 6 lbs since last visit.  Encouraged her to continue a healthy diet and be active when able.  Will continue to follow.

## 2019-12-13 NOTE — Assessment & Plan Note (Signed)
Has seen Dr Rolena Infante and Dr Louanne Skye for this issue.  Neither one is interested in pursuing surgery at this time.  Unfortunately pt is not getting relief from injxns.  Has appt upcoming w/ Dr Rush Farmer to assess hip.  Will continue to follow.

## 2019-12-13 NOTE — Progress Notes (Signed)
   Subjective:    Patient ID: [name], female    DOB: December 03, 1950, 69 y.o.   MRN: [id number]  HPI DM- pt has appt upcoming w/ Dr Kelton Pillar.  Saw Dr Dorris Fetch last month.  A1C was much better at 6.9  Denies symptomatic lows.  Pt can't remember if she had eye exam w/ Dr Bing Plume this year or last year.  + tingling of hands/feet.  DDD- ongoing issue for pt.  Has had multiple injxns w/o relief.  Dr Louanne Skye now feels that her pain could be due to her hip.  Has appt upcoming w/ Dr Rush Farmer.  Morbid Obesity- pt is down 6 lbs since last visit.  Has improved diet.   Review of Systems For ROS see HPI   This visit occurred during the SARS-CoV-2 public health emergency.  Safety protocols were in place, including screening questions prior to the visit, additional usage of staff PPE, and extensive cleaning of exam room while observing appropriate contact time as indicated for disinfecting solutions.       Objective:   Physical Exam Vitals reviewed.  Constitutional:      General: She is not in acute distress.    Appearance: She is obese.  HENT:     Head: Normocephalic and atraumatic.  Cardiovascular:     Rate and Rhythm: Normal rate and regular rhythm.     Pulses: Normal pulses.  Pulmonary:     Effort: Pulmonary effort is normal. No respiratory distress.     Breath sounds: Normal breath sounds. No wheezing, rhonchi or rales.  Abdominal:     General: Abdomen is flat. There is no distension.     Tenderness: There is no abdominal tenderness. There is no guarding.  Musculoskeletal:     Right lower leg: Edema (1+) present.     Left lower leg: Edema (1+) present.  Skin:    General: Skin is warm and dry.  Neurological:     General: No focal deficit present.     Mental Status: She is alert and oriented to person, place, and time.           Assessment & Plan:

## 2019-12-19 ENCOUNTER — Ambulatory Visit: Payer: Medicare HMO

## 2019-12-19 DIAGNOSIS — I10 Essential (primary) hypertension: Secondary | ICD-10-CM

## 2019-12-19 DIAGNOSIS — IMO0002 Reserved for concepts with insufficient information to code with codable children: Secondary | ICD-10-CM

## 2019-12-19 NOTE — Progress Notes (Signed)
Chronic Care Management Pharmacy  Name: [name]  MRN: [id number] DOB: January 24, 1951  Chief Complaint/ HPI  [name],  69 y.o. , female presents for their Follow-Up CCM visit with the clinical pharmacist via telephone due to COVID-19 Pandemic.  Reports recent morning blood sugar has ranged 140s-170s throughout day, one am FBG near 70 over past week. Reports testing once every morning, afternoon, before dinner. Has not been testing before bed.   States dexcom CBG was not approved by insurance and pharmacist said it would be more than $1000.   Overall has been trying to eat healthier by limiting carbohydrates. Is trying to eat more frequent meals throughout day.   Is not sure what the follow-up plan is for kidney doctor.  PCP : [name], MD  Their chronic conditions include: Type 2 DM, HF, hyperparathyroidism, HLD, HTN, GERD  Patient Active Problem List   Diagnosis Date Noted  . Hyperparathyroidism ([place]) 12/13/2019  . Elevated hemoglobin ([place]) 12/13/2019  . Chronic renal impairment, stage 4 (severe) ([place]) 10/28/2019  . Anemia 10/28/2019  . Physical exam 06/26/2019  . Neck pain 04/18/2019  . Ascending aortic aneurysm ([place]) 04/17/2019  . Hypoxia   . Nonspecific chest pain 04/15/2019  . Neuropathic pain of right flank 12/27/2018  . Dysphagia 12/27/2018  . Postsurgical hypothyroidism 06/11/2018  . Vitamin D deficiency 06/11/2018  . Multinodular goiter 06/11/2018  . Hypercalcemia 06/11/2018  . DM type 2 causing vascular disease ([place])   . PONV (postoperative nausea and vomiting)   . Peripheral neuropathy   . Irritable bowel syndrome   . Hypertension   . History of right mastectomy   . H/O hiatal hernia   . GERD (gastroesophageal reflux disease)   . Family history of anesthesia complication   . Diabetes mellitus type 2, uncontrolled, with complications ([place])   . Lower extremity edema   . Chronic constipation 06/23/2015  . Morbid obesity ([place])  06/12/2015  . Chronic diastolic heart failure ([place]) 11/17/2014  . Herniated nucleus pulposus, L2-3 right 02/04/2014    Class: Acute  . Spondylolisthesis of lumbar region 04/30/2012    Class: Chronic  . Lumbar stenosis with neurogenic claudication 04/30/2012    Class: Chronic  . SYNCOPE-CAROTID SINUS 09/16/2008  . OSTEOARTHRITIS 08/23/2006  . DDD (degenerative disc disease), cervical 08/23/2006  . [place] DISEASE, LUMBAR 08/23/2006  . Headache(784.0) 08/23/2006  . BREAST CANCER, HX OF 08/23/2006  . HYPERPARATHYROIDISM, HX OF 08/23/2006  . RENAL CALCULUS, HX OF 08/23/2006   Social History   Socioeconomic History  . Marital status: Married    Spouse name: Not on file  . Number of children: 1  . Years of education: Not on file  . Highest education level: Not on file  Occupational History  . Occupation: Retired since 2003  Tobacco Use  . Smoking status: Never Smoker  . Smokeless tobacco: Never Used  Vaping Use  . Vaping Use: Never used  Substance and Sexual Activity  . Alcohol use: No  . Drug use: No  . Sexual activity: Not on file  Other Topics Concern  . Not on file  Social History Narrative  . Not on file   Social Determinants of Health   Financial Resource Strain:   . Difficulty of Paying Living Expenses: Not on file  Food Insecurity: No Food Insecurity  . Worried About Charity fundraiser in the Last Year: Never true  . Ran Out of Food in the Last Year: Never true  Transportation Needs:  No Transportation Needs  . Lack of Transportation (Medical): No  . Lack of Transportation (Non-Medical): No  Physical Activity:   . Days of Exercise per Week: Not on file  . Minutes of Exercise per Session: Not on file  Stress:   . Feeling of Stress : Not on file  Social Connections:   . Frequency of Communication with Friends and Family: Not on file  . Frequency of Social Gatherings with Friends and Family: Not on file  . Attends Religious Services: Not on file  . Active  Member of Clubs or Organizations: Not on file  . Attends Archivist Meetings: Not on file  . Marital Status: Not on file   Family History  Problem Relation Age of Onset  . Hypertension Mother   . Breast cancer Mother 23  . Diabetes Father   . Hypertension Father   . Hypertension Sister   . Hypertension Brother   . Hypertension Brother   . Diabetes Brother   . Diabetes Brother   . Diabetes Other        Family Hx. of Diabetes  . Hypertension Other        Family History of HTN  . Deep vein thrombosis Daughter   . Breast cancer Maternal Aunt   . Colon cancer Neg Hx   . Esophageal cancer Neg Hx   . Liver cancer Neg Hx   . Rectal cancer Neg Hx   . Stomach cancer Neg Hx    Allergies  Allergen Reactions  . Dilaudid [Hydromorphone Hcl] Nausea And Vomiting  . Hydromorphone   . Lipitor [Atorvastatin] Other (See Comments)    Body & Muscle Aches  . Adhesive [Tape] Rash  . Ampicillin Rash and Other (See Comments)    REACTION: Rash and welts on her hands when she took it in the hospital  . Latex Itching, Dermatitis, Rash and Other (See Comments)    REACTION: Patient had a reaction to tape after surgery and they told her she was allergic to Latex   . Penicillins Rash and Other (See Comments)    Has patient had a PCN reaction causing immediate rash, facial/tongue/throat swelling, SOB or lightheadedness with hypotension: No Has patient had a PCN reaction causing severe rash involving mucus membranes or skin necrosis: No Has patient had a PCN reaction that required hospitalization: Yes - in hospital receiving treatment Has patient had a PCN reaction occurring within the last 10 years: No If all of the above answers are "NO", then may proceed with Cephalosporin use.    Outpatient Encounter Medications as of 12/19/2019  Medication Sig Note  . acetaminophen (TYLENOL) 500 MG tablet Take 1,000 mg by mouth every 6 (six) hours as needed for moderate pain or headache.   . Alcohol Swabs  (B-D SINGLE USE SWABS REGULAR) PADS Use as directed.   [name] Kitchen amLODipine (NORVASC) 10 MG tablet TAKE 1 TABLET BY MOUTH EVERY DAY (Patient taking differently: Take 10 mg by mouth daily. )   . aspirin 81 MG tablet Take 81 mg by mouth daily.   . Blood Glucose Calibration (TRUE METRIX LEVEL 2) Normal SOLN Use as directed   . Blood Glucose Monitoring Suppl (TRUE METRIX AIR GLUCOSE METER) w/Device KIT 1 kit by Does not apply route 3 (three) times daily.   . carvedilol (COREG) 6.25 MG tablet Take 1 tablet (6.25 mg total) by mouth 2 (two) times daily with a meal. 10/19/2019: Pt takes upon waking in the morning and in the evening at supper.  No specified time.  . Continuous Blood Gluc Receiver (DEXCOM G6 RECEIVER) DEVI 1 Piece by Does not apply route as needed.   . Continuous Blood Gluc Sensor (DEXCOM G6 SENSOR) MISC 4 Pieces by Does not apply route once a week.   . Continuous Blood Gluc Transmit (DEXCOM G6 TRANSMITTER) MISC 1 Piece by Does not apply route as directed.   . cyclobenzaprine (FLEXERIL) 5 MG tablet Take 1 tablet (5 mg total) by mouth 2 (two) times daily as needed for muscle spasms.   . diclofenac Sodium (VOLTAREN) 1 % GEL Apply 4 g topically 4 (four) times daily as needed. (Patient taking differently: Apply 4 g topically 4 (four) times daily as needed (pain). )   . ferrous gluconate (FERGON) 324 MG tablet TAKE 1 TABLET (324 MG TOTAL) BY MOUTH 2 (TWO) TIMES DAILY WITH A MEAL.   . furosemide (LASIX) 40 MG tablet TAKE 1 TABLET (40 MG TOTAL) BY MOUTH DAILY AS NEEDED (LEG SWELLING). (Patient taking differently: Take 40 mg by mouth daily as needed (leg swelling). Patient taking 78m daily)   . glipiZIDE (GLUCOTROL XL) 5 MG 24 hr tablet TAKE 1 TABLET BY MOUTH EVERY DAY WITH BREAKFAST (Patient taking differently: Take 5 mg by mouth daily with breakfast. )   . glucose blood (TRUE METRIX BLOOD GLUCOSE TEST) test strip Use as instructed   . hydrALAZINE (APRESOLINE) 50 MG tablet Patient taking 258m3x/day   .  HYDROcodone-acetaminophen (NORCO/VICODIN) 5-325 MG tablet Take 1 tablet by mouth every 6 (six) hours as needed for moderate pain.   . [name] Kitchennsulin NPH-regular Human (70-30) 100 UNIT/ML injection Inject 40 Units into the skin See admin instructions. 40 units with breakfast and 40 units with supper for BG above 90 10/09/2019: Took 40 units  . levothyroxine (SYNTHROID) 100 MCG tablet Take 1 tablet (100 mcg total) by mouth daily.   . [name] Kitchenosartan (COZAAR) 100 MG tablet  12/13/2019: Pt says she takes 1/2 of a 253mablet  . metoCLOPramide (REGLAN) 10 MG tablet Take 1 tablet (10 mg total) by mouth 4 (four) times daily.   . ondansetron (ZOFRAN) 4 MG tablet Take 4 mg by mouth every 8 (eight) hours as needed for nausea or vomiting.   . pantoprazole (PROTONIX) 40 MG tablet Take 1 tablet (40 mg total) by mouth 2 (two) times daily. (Patient taking differently: Take 40 mg by mouth 2 (two) times daily as needed (acid reflux). )   . Polyethyl Glycol-Propyl Glycol (SYSTANE) 0.4-0.3 % SOLN Apply to eye. Taking 2x/day   . polyethylene glycol (MIRALAX / GLYCOLAX) packet Take 17 g by mouth daily as needed for moderate constipation.   . rosuvastatin (CRESTOR) 20 MG tablet Take 1 tablet (20 mg total) by mouth daily at 6 PM.   . traMADol (ULTRAM) 50 MG tablet Take 50 mg by mouth every 6 (six) hours as needed. 1-2tabs qhrs prn   . TRUEplus Lancets 33G MISC Use as directed    No facility-administered encounter medications on file as of 12/19/2019.   Current Diagnosis/Assessment: Goals Addressed            This Visit's Progress   . A1C <7.5%   On track    CARE PLAN ENTRY Current Barriers:  . Diabetes: type 2; complicated by chronic medical conditions including high blood pressure, chronic kidney disease. Lab Results  Component Value Date   HGBA1C 8.4 (H) 08/05/2019 .   Lab Results  Component Value Date   CREATININE 1.55 (H) 08/05/2019   CREATININE 2.07 (  H) 04/20/2019   CREATININE 2.13 (H) 04/19/2019   Current diabetes  regimen:  Glipizide XL 5 mg once daily    Insulin regular 70-3070 units with breakfast and 60 units with supper for BG above 90 . Denies hypoglycemic symptoms, including dizziness, lightheadedness, shaking, sweating  Pharmacist Clinical Goal(s):  [name] Kitchen Over the next 180 days, patient will work with PharmD and primary care provider to address diabetes. o A1c goal <7.5%2  Interventions: . Comprehensive medication review performed, medication list updated in electronic medical record . Inter-disciplinary care team collaboration  Patient Self Care Activities:  . Patient will check blood glucose daily , document, and provide at future appointments . Patient will take medications as prescribed . Patient will contact provider with any episodes of hypoglycemia . Patient will report any questions or concerns to provider  Initial goal documentation.    . BP <140/90   On track    CARE PLAN ENTRY Current Barriers:  . Uncontrolled hypertension, complicated by type 2 diabetes . Current high blood pressure regimen:   Carvedilol 6.25 mg twice daily with meal (replaces losartan)  Furosemide 40 mg as needed  Amlodipine 10 mg daily  . Previous antihypertensives tried:   Losartan 50 mg once daily (held due to kidney injury, replaced by carvedilol)  . Last practice recorded BP readings:  BP Readings from Last 3 Encounters:  08/14/19 (!) 170/67  08/09/19 (!) 185/66  08/02/19 (!) 151/70  Pharmacist Clinical Goal(s):  [name] Kitchen Over the next 180 days, patient will work with PharmD and providers to optimize antihypertensive regimen o BP goal <140/90 Interventions: . Inter-disciplinary care team collaboration . Comprehensive medication review performed; medication list updated in the electronic medical record.  Patient Self Care Activities:  . Patient will continue to check blood pressure. Monitor blood pressure various times throughout the day each day - document, and provide at future  appointments Initial goal documentation.    [name] Kitchen PharmD Care Plan   On track    CARE PLAN ENTRY  Current Barriers:  . Chronic Disease Management support, education, and care coordination needs related to Hypertension, Hyperlipidemia, Diabetes, Heart Failure, and GERD/Gastroparesis   Hypertension / Heart Failure  . Pharmacist Clinical Goal(s): o Over the next 90 days, patient will work with PharmD and providers to maintain BP goal <140/90 . Current regimen:   Carvedilol 6.25 mg twice daily with meal (high blood pressure & heart failure)  Furosemide 40 mg as needed (removes excess water in the body / reduces swelling in legs)  Losartan 50 mg once daily (high blood pressure & reduces kidney damage / protein in the urine in patients with diabetes)  Amlodipine 10 mg daily (high blood pressure) . Interventions: o Continue current management . Patient self care activities - Over the next 90 days, patient will: o Check BP at least once every 1-2 weeks, document, and provide at future appointments o Ensure daily salt intake < 2300 mg/day  Hyperlipidemia . Pharmacist Clinical Goal(s): o Over the next 90 days, patient will work with PharmD and providers to maintain LDL goal < 100 . Current regimen:   Rosuvastatin 40 mg daily (helps lower lipids/cholesterol and reduces risk of heart attack and stroke) . Interventions: o Continue current management . Patient self care activities - Over the next 90 days, patient will: o Continue current management   Diabetes . Pharmacist Clinical Goal(s): o Over the next 90 days, patient will work with PharmD and providers to achieve A1c goal <8% . Current regimen:  Insulin-NPH-regular Human (70-30) 60 units twice daily (Type 2 Diabetes)  Glipizide xl 5 mg daily (Type 2 Diabetes) . Interventions: o Continue current management . Patient self care activities - Over the next 90 days, patient will: o Check blood sugar once daily, document, and provide at  future appointments o Contact provider with any episodes of hypoglycemia  Acid reflux / gastroparesis . Pharmacist Clinical Goal(s) o Over the next 90 days, patient will work with PharmD and providers to minimize symptoms of acid reflux and gastroparesis . Current regimen:  . Pantoprazole 40 mg once daily (acid reflux) . Metoclopramide 10 mg four times daily (helps reduce feeling full/bloated after you start eating, also helps with acid reflux) . Interventions: o Continue current management . Patient self care activities - Over the next 90 days, patient will: o Continue current management  Medication management . Pharmacist Clinical Goal(s): o Over the next 90 days, patient will work with PharmD and providers to achieve optimal medication adherence . Current pharmacy: [facility] . Interventions o Comprehensive medication review performed. o Continue current medication management strategy . Patient self care activities - Over the next 90 days, patient will: o Take medications as prescribed o Report any questions or concerns to PharmD and/or provider(s)      Hypertension / Heart failure   Office blood pressures are  BP Readings from Last 3 Encounters:  12/13/19 118/68  12/05/19 130/66  11/29/19 (!) 147/71   Echocardiogram 04/17/2019: LVEF 55 to 60%, severe LVH, asymmetric hypertrophy with thickened basilar septum.  Indeterminate diastolic function.  Normal LA, mildly enlarged RV with normal function, normal RA.  Mild MR, trivial PR.  Normal PASP.  Mild dilation of the ascending aorta.  Patient is currently taking:  Carvedilol 6.25 mg twice daily with meal (high blood pressure & heart failure)  Furosemide 40 mg as needed (removes excess water in the body / reduces swelling in legs)  Losartan 25 mg once daily (high blood pressure & reduces kidney damage / protein in the urine in patients with diabetes)  Amlodipine 10 mg daily (high blood pressure)  Denies dizziness, SOB,  chest pain. We reviewed the indications and proper administration for each medication.  We discussed diet recommendations at length.   Plan Continue current medications and control with diet and exercise.   Diabetes   Recent Relevant Labs: Lab Results  Component Value Date/Time   HGBA1C 6.9 (A) 12/05/2019 10:45 AM   HGBA1C 8.4 (H) 08/05/2019 03:15 PM   HGBA1C 8.9 (A) 07/03/2019 03:54 PM   HGBA1C 10.3 (H) 03/01/2019 02:07 PM   MICROALBUR 570.4 08/05/2019 03:15 PM   MICROALBUR 188.7 06/01/2018 03:25 PM   Kidney Function Lab Results  Component Value Date/Time   CREATININE 1.68 (H) 10/28/2019 01:32 PM   CREATININE 2.25 (H) 10/19/2019 03:30 AM   CREATININE 1.64 (H) 09/19/2019 02:20 PM   CREATININE 1.55 (H) 08/05/2019 03:15 PM   CREATININE 1.39 (H) 03/01/2019 02:07 PM   GFR 36.33 (L) 10/28/2019 01:32 PM   GFRNONAA 21 (L) 10/19/2019 03:30 AM   GFRNONAA 31 (L) 09/19/2019 02:20 PM   GFRNONAA 38 (L) 03/01/2019 02:07 PM   GFRAA 25 (L) 10/19/2019 03:30 AM   GFRAA 36 (L) 09/19/2019 02:20 PM   GFRAA 44 (L) 03/01/2019 02:07 PM   11/2019 a1c of 6.9% down from 07/2019 a1c 8.4%. Taken off of metformin due to CKD. Denies any episodes of low blood sugar. Patient is currently controlled on the following medications:   Insulin-NPH-regular Human (70-30) SQ as  directed  Glipizide xl 5 mg daily (Type 2 Diabetes)  Plan Continue current medications and control with diet and exercise.   Hypothyroidism    Lab Results  Component Value Date   TSH 0.83 10/28/2019   TSH 1.18 08/05/2019   TSH 2.13 03/01/2019   TSH consistently within normal range since 09/2018.  Patient is currently controlled on the following medications:   Levothyroxine 100 mcg daily (thyroid hormone replacement)  Plan  Continue current medications.  Acid reflux / gastroparesis / constipation    Patient is currently controlled on the following medications:  . Pantoprazole 40 mg once daily (acid reflux) . Metoclopramide  10 mg four times daily (helps reduce feeling full/bloated after you start eating, also helps with acid reflux)  Plan  Continue current medications and control with diet and exercise.  Hyperlipidemia   LDL goal <70      Component Value Date/Time   CHOL 155 08/05/2019 1515   TRIG 269 (H) 08/05/2019 1515   HDL 41 (L) 08/05/2019 1515   CHOLHDL 3.8 08/05/2019 1515   VLDL 42 (H) 04/17/2019 0227   LDLCALC 79 08/05/2019 1515   The 10-year ASCVD risk score Mikey Bussing DC Jr., et al., 2013) is: 18.8%   Values used to calculate the score:     Age: 20 years     Sex: Female     Is Non-Hispanic African American: Yes     Diabetic: Yes     Tobacco smoker: No     Systolic Blood Pressure: 748 mmHg     Is BP treated: Yes     HDL Cholesterol: 41 mg/dL     Total Cholesterol: 155 mg/dL   Currently not a goal on the following medications:  Rosuvastatin 20 mg daily (helps lower lipids/cholesterol and reduces risk of heart attack and stroke)  We discussed: DM diet including carb reduction plate method, routine exercise using fitness benefit through advantage plan.   Plan  Continue current medications.   Vaccines   Immunization History  Administered Date(s) Administered  . Fluad Quad(high Dose 65+) 12/13/2019  . Influenza Split 01/31/2011  . Influenza Whole 02/23/2007  . Influenza, High Dose Seasonal PF 01/10/2017  . Influenza,inj,Quad PF,6+ Mos 01/07/2014, 02/09/2016, 01/01/2018, 12/03/2018  . Influenza-Unspecified 01/10/2012  . PFIZER SARS-COV-2 Vaccination 05/24/2019, 06/18/2019  . Pneumococcal Conjugate-13 05/16/2019  . Pneumococcal Polysaccharide-23 08/25/2014  . Tdap 04/20/2011   Plan  Recommended patient receive annual flu, covid booster, Shingrix vaccines.   Medication Management   Pt uses Patent attorney for all medications. Uses pill box? No.  Current insurance mail order provides lowest copay.   Plan  Continue current medication management  strategy.  Follow up:   RPH: 2 month phone visit.    CPA: Call Dr. Avis Epley office Phone: [phone] to see if patient has visit scheduled with them or if ask what the f/u plan is for patient. Patient was not sure what to expect. [place] communicate information to patient. ______________ Visit Information SDOH (Social Determinants of Health) assessments performed: Yes.  Future Appointments  Date Time Provider Divide  12/23/2019  9:45 AM Mcarthur Rossetti, MD [facility] None  12/30/2019 12:45 PM Florance, Tomasa Blase, RN [facility] None  01/20/2020  1:40 PM Shamleffer, Melanie Crazier, MD [facility] None  02/21/2020 11:00 AM [facility] PHARMACIST [facility]  03/09/2020 11:00 AM Brita Romp, NP [facility] None  03/25/2020 12:00 PM [facility] DX DEXA 1 [facility] CE    [name], Pharm.D., BCGP Clinical Pharmacist  [facility] [phone]

## 2019-12-22 ENCOUNTER — Other Ambulatory Visit: Payer: Self-pay | Admitting: "Endocrinology

## 2019-12-22 NOTE — Patient Instructions (Signed)
Please review care plan below and call me at [phone] (direct line) with any questions!  Thank you, Edyth Gunnels., Clinical Pharmacist  Goals Addressed            This Visit's Progress   . A1C <7.5%   On track    CARE PLAN ENTRY Current Barriers:  . Diabetes: type 2; complicated by chronic medical conditions including high blood pressure, chronic kidney disease. Lab Results  Component Value Date   HGBA1C 8.4 (H) 08/05/2019 .   Lab Results  Component Value Date   CREATININE 1.55 (H) 08/05/2019   CREATININE 2.07 (H) 04/20/2019   CREATININE 2.13 (H) 04/19/2019   Current diabetes regimen:  Glipizide XL 5 mg once daily    Insulin regular 70-3070 units with breakfast and 60 units with supper for BG above 90 . Denies hypoglycemic symptoms, including dizziness, lightheadedness, shaking, sweating  Pharmacist Clinical Goal(s):  [name] Kitchen Over the next 180 days, patient will work with PharmD and primary care provider to address diabetes. o A1c goal <7.5%2  Interventions: . Comprehensive medication review performed, medication list updated in electronic medical record . Inter-disciplinary care team collaboration  Patient Self Care Activities:  . Patient will check blood glucose daily , document, and provide at future appointments . Patient will take medications as prescribed . Patient will contact provider with any episodes of hypoglycemia . Patient will report any questions or concerns to provider  Initial goal documentation.    . BP <140/90   On track    CARE PLAN ENTRY Current Barriers:  . Uncontrolled hypertension, complicated by type 2 diabetes . Current high blood pressure regimen:   Carvedilol 6.25 mg twice daily with meal (replaces losartan)  Furosemide 40 mg as needed  Amlodipine 10 mg daily  . Previous antihypertensives tried:   Losartan 50 mg once daily (held due to kidney injury, replaced by carvedilol)  . Last practice recorded BP readings:  BP Readings from Last  3 Encounters:  08/14/19 (!) 170/67  08/09/19 (!) 185/66  08/02/19 (!) 151/70  Pharmacist Clinical Goal(s):  [name] Kitchen Over the next 180 days, patient will work with PharmD and providers to optimize antihypertensive regimen o BP goal <140/90 Interventions: . Inter-disciplinary care team collaboration . Comprehensive medication review performed; medication list updated in the electronic medical record.  Patient Self Care Activities:  . Patient will continue to check blood pressure. Monitor blood pressure various times throughout the day each day - document, and provide at future appointments Initial goal documentation.    [name] Kitchen PharmD Care Plan   On track    CARE PLAN ENTRY  Current Barriers:  . Chronic Disease Management support, education, and care coordination needs related to Hypertension, Hyperlipidemia, Diabetes, Heart Failure, and GERD/Gastroparesis   Hypertension / Heart Failure  . Pharmacist Clinical Goal(s): o Over the next 90 days, patient will work with PharmD and providers to maintain BP goal <140/90 . Current regimen:   Carvedilol 6.25 mg twice daily with meal (high blood pressure & heart failure)  Furosemide 40 mg as needed (removes excess water in the body / reduces swelling in legs)  Losartan 50 mg once daily (high blood pressure & reduces kidney damage / protein in the urine in patients with diabetes)  Amlodipine 10 mg daily (high blood pressure) . Interventions: o Continue current management . Patient self care activities - Over the next 90 days, patient will: o Check BP at least once every 1-2 weeks, document, and provide at future appointments o Ensure daily  salt intake < 2300 mg/day  Hyperlipidemia . Pharmacist Clinical Goal(s): o Over the next 90 days, patient will work with PharmD and providers to maintain LDL goal < 100 . Current regimen:   Rosuvastatin 40 mg daily (helps lower lipids/cholesterol and reduces risk of heart attack and  stroke) . Interventions: o Continue current management . Patient self care activities - Over the next 90 days, patient will: o Continue current management   Diabetes . Pharmacist Clinical Goal(s): o Over the next 90 days, patient will work with PharmD and providers to achieve A1c goal <8% . Current regimen:   Insulin-NPH-regular Human (70-30) 60 units twice daily (Type 2 Diabetes)  Glipizide xl 5 mg daily (Type 2 Diabetes) . Interventions: o Continue current management . Patient self care activities - Over the next 90 days, patient will: o Check blood sugar once daily, document, and provide at future appointments o Contact provider with any episodes of hypoglycemia  Acid reflux / gastroparesis . Pharmacist Clinical Goal(s) o Over the next 90 days, patient will work with PharmD and providers to minimize symptoms of acid reflux and gastroparesis . Current regimen:  . Pantoprazole 40 mg once daily (acid reflux) . Metoclopramide 10 mg four times daily (helps reduce feeling full/bloated after you start eating, also helps with acid reflux) . Interventions: o Continue current management . Patient self care activities - Over the next 90 days, patient will: o Continue current management  Medication management . Pharmacist Clinical Goal(s): o Over the next 90 days, patient will work with PharmD and providers to achieve optimal medication adherence . Current pharmacy: [facility] . Interventions o Comprehensive medication review performed. o Continue current medication management strategy . Patient self care activities - Over the next 90 days, patient will: o Take medications as prescribed o Report any questions or concerns to PharmD and/or provider(s)      The patient verbalized understanding of instructions provided today and agreed to receive a mailed copy of patient instruction and/or educational materials. Telephone follow up appointment with pharmacy team member scheduled for:  See next appointment with "Care Management Staff" under "What's Next" below.   [name], Pharm.D., BCGP Clinical Pharmacist [facility] [phone]

## 2019-12-23 ENCOUNTER — Ambulatory Visit (INDEPENDENT_AMBULATORY_CARE_PROVIDER_SITE_OTHER): Payer: Medicare HMO

## 2019-12-23 ENCOUNTER — Encounter: Payer: Self-pay | Admitting: Orthopaedic Surgery

## 2019-12-23 ENCOUNTER — Ambulatory Visit: Payer: Medicare HMO | Admitting: Orthopaedic Surgery

## 2019-12-23 VITALS — Ht 64.0 in | Wt 211.0 lb

## 2019-12-23 DIAGNOSIS — M25551 Pain in right hip: Secondary | ICD-10-CM

## 2019-12-23 NOTE — Progress Notes (Signed)
Office Visit Note   [name]           Date of Birth: 04-Dec-1950           MRN: [id number] Visit Date: 12/23/2019              Requested by: [name], MD [address] [place],  [place] [address] PCP: [name], MD   Assessment & Plan: Visit Diagnoses:  1. Pain in right hip     Plan: Based on x-ray findings and clinical exam findings, I do not feel her issues related to her right hip at all.  We talked about this in detail and all questions and concerns were answered and addressed.  Follow-up for the hip can be as needed.  Follow-Up Instructions: Return if symptoms worsen or fail to improve.   Orders:  Orders Placed This Encounter  Procedures  . XR HIP UNILAT W OR W/O PELVIS 1V RIGHT   No orders of the defined types were placed in this encounter.     Procedures: No procedures performed   Clinical Data: No additional findings.   Subjective: Chief Complaint  Patient presents with  . Right Hip - Pain  The patient is referred to me to evaluate the potential for right hip pain.  She has had extensive spine surgery in the past.  She does ambulate using a cane.  I have taken care of family members before.  She denies any groin pain at all.  Most of her pain is in the lumbar spine area that radiates down her right side in the sciatic region and down the leg.  She denies any groin pain at all.  I did watch her ambulate and her right leg externally rotates just a little bit compared to the left side.  She has had no other acute change in her medical status.  She says standing for long period time causes her back to hurt quite significantly.  HPI  Review of Systems She currently denies any headache, chest pain, shortness of breath, fever, chills, nausea, vomiting  Objective: Vital Signs: Ht 5\' 4"  (1.626 m)   Wt 211 lb (95.7 kg)   BMI 36.22 kg/m   Physical Exam She is alert and oriented x3 and in no acute distress Ortho  Exam Examination her right hip shows that it moves smoothly and congruently.  There is just only slight stiffness in that right hip comparing the right and left hips but there is no blocks to rotation and no pain in the groin when I rotate her hip. Specialty Comments:  No specialty comments available.  Imaging: XR HIP UNILAT W OR W/O PELVIS 1V RIGHT  Result Date: 12/23/2019 An AP pelvis and lateral the right hip shows a well-maintained joint space of both hips and no significant arthritic changes at all.  The hip joint space is concentric and congruent.    PMFS History: Patient Active Problem List   Diagnosis Date Noted  . Hyperparathyroidism ([place]) 12/13/2019  . Elevated hemoglobin ([place]) 12/13/2019  . Chronic renal impairment, stage 4 (severe) ([place]) 10/28/2019  . Anemia 10/28/2019  . Physical exam 06/26/2019  . Neck pain 04/18/2019  . Ascending aortic aneurysm ([place]) 04/17/2019  . Hypoxia   . Nonspecific chest pain 04/15/2019  . Neuropathic pain of right flank 12/27/2018  . Dysphagia 12/27/2018  . Postsurgical hypothyroidism 06/11/2018  . Vitamin D deficiency 06/11/2018  . Multinodular goiter 06/11/2018  . Hypercalcemia 06/11/2018  .  DM type 2 causing vascular disease ([place])   . PONV (postoperative nausea and vomiting)   . Peripheral neuropathy   . Irritable bowel syndrome   . Hypertension   . History of right mastectomy   . H/O hiatal hernia   . GERD (gastroesophageal reflux disease)   . Family history of anesthesia complication   . Diabetes mellitus type 2, uncontrolled, with complications ([place])   . Lower extremity edema   . Chronic constipation 06/23/2015  . Morbid obesity ([place]) 06/12/2015  . Chronic diastolic heart failure ([place]) 11/17/2014  . Herniated nucleus pulposus, L2-3 right 02/04/2014    Class: Acute  . Spondylolisthesis of lumbar region 04/30/2012    Class: Chronic  . Lumbar stenosis with neurogenic claudication 04/30/2012    Class: Chronic  .  SYNCOPE-CAROTID SINUS 09/16/2008  . OSTEOARTHRITIS 08/23/2006  . DDD (degenerative disc disease), cervical 08/23/2006  . [place] DISEASE, LUMBAR 08/23/2006  . Headache(784.0) 08/23/2006  . BREAST CANCER, HX OF 08/23/2006  . HYPERPARATHYROIDISM, HX OF 08/23/2006  . RENAL CALCULUS, HX OF 08/23/2006   Past Medical History:  Diagnosis Date  . Anginal pain ([place]) 03/19/2012   saw Dr. Cathie Olden .. she thinks its more related to stomach issues  . Arthritis    "all over; mostly in my back" (03/20/2012)  . Breast cancer ([place])    "right" (03/20/2012)  . Cardiomyopathy   . Chest pain 06/2005   Hospitalized, dystolic dysfunction  . Chronic lower back pain    "have 2 herniated discs; going to have to have a fusion" (03/20/2012)  . CKD (chronic kidney disease), stage III   . Family history of anesthesia complication    "father has nausea and vomiting"  . Gallstones   . GERD (gastroesophageal reflux disease)   . H/O hiatal hernia   . History of kidney stones   . History of right mastectomy   . Hypertension    "patient states never had HTN, takes Hyzaar for heart  . Hypothyroidism   . Irritable bowel syndrome   . Migraines    "it's been a long time since I've had one" (03/20/2012)  . Osteoarthritis   . Peripheral neuropathy   . PONV (postoperative nausea and vomiting)   . Sciatica   . Type II diabetes mellitus ([facility])     Family History  Problem Relation Age of Onset  . Hypertension Mother   . Breast cancer Mother 57  . Diabetes Father   . Hypertension Father   . Hypertension Sister   . Hypertension Brother   . Hypertension Brother   . Diabetes Brother   . Diabetes Brother   . Diabetes Other        Family Hx. of Diabetes  . Hypertension Other        Family History of HTN  . Deep vein thrombosis Daughter   . Breast cancer Maternal Aunt   . Colon cancer Neg Hx   . Esophageal cancer Neg Hx   . Liver cancer Neg Hx   . Rectal cancer Neg Hx   . Stomach cancer Neg Hx     Past Surgical  History:  Procedure Laterality Date  . ABDOMINAL HYSTERECTOMY  1993  . adenosin cardiolite  07/2005   (+) wall motion abnormality  . AXILLARY LYMPH NODE DISSECTION  2003   squamous cell cancer  . BACK SURGERY    . CARDIAC CATHETERIZATION  ? 2005 / 2007  . CHOLECYSTECTOMY N/A 01/24/2018   Procedure: LAPAROSCOPIC CHOLECYSTECTOMY;  Surgeon: Erroll Luna, MD;  Location: [facility];  Service: General;  Laterality: N/A;  . CT abdomen and pelvis  02/2010   Same  . ESOPHAGOGASTRODUODENOSCOPY  2005   GERD  . KNEE ARTHROSCOPY  1990's   "right" (03/20/2012)  . LUMBAR FUSION N/A 08/22/2014   Procedure: Right L2-3 and right L1-2 transforaminal lumbar interbody fusion with pedicle screws, rods, sleeves, cages, local bone graft, Vivigen, cancellous chips;  Surgeon: Jessy Oto, MD;  Location: [facility];  Service: Orthopedics;  Laterality: N/A;  . LUMBAR LAMINECTOMY/DECOMPRESSION MICRODISCECTOMY N/A 02/04/2014   Procedure: RIGHT L2-3 MICRODISCECTOMY ;  Surgeon: Jessy Oto, MD;  Location: [facility];  Service: Orthopedics;  Laterality: N/A;  . MASTECTOMY Right   . MASTECTOMY WITH AXILLARY LYMPH NODE DISSECTION  12/2003   "right" (03/20/2012)  . POSTERIOR CERVICAL FUSION/FORAMINOTOMY  2001  . SHOULDER ARTHROSCOPY W/ ROTATOR CUFF REPAIR  2003   Left  . THYROIDECTOMY  1977   Right  . TOTAL KNEE ARTHROPLASTY  2000   Right  . [place] of abdomen  07/2005   Fatty liver / kidney stones   Social History   Occupational History  . Occupation: Retired since 2003  Tobacco Use  . Smoking status: Never Smoker  . Smokeless tobacco: Never Used  Vaping Use  . Vaping Use: Never used  Substance and Sexual Activity  . Alcohol use: No  . Drug use: No  . Sexual activity: Not on file

## 2019-12-26 IMAGING — US US ABDOMEN LIMITED
1 series · 14 of 25 positions shown · non-contrast
Comparison: CT abdomen and pelvis 12/25/2017

CLINICAL DATA: Chest pain for 1 week. Kept her up last night. Right
upper quadrant pain. CT demonstrated gallstones.

EXAM:
ULTRASOUND ABDOMEN LIMITED RIGHT UPPER QUADRANT

[Series 1: us abdomen limited · 0.25mm/px · 14 of 26 slices shown]
[im 1/26]
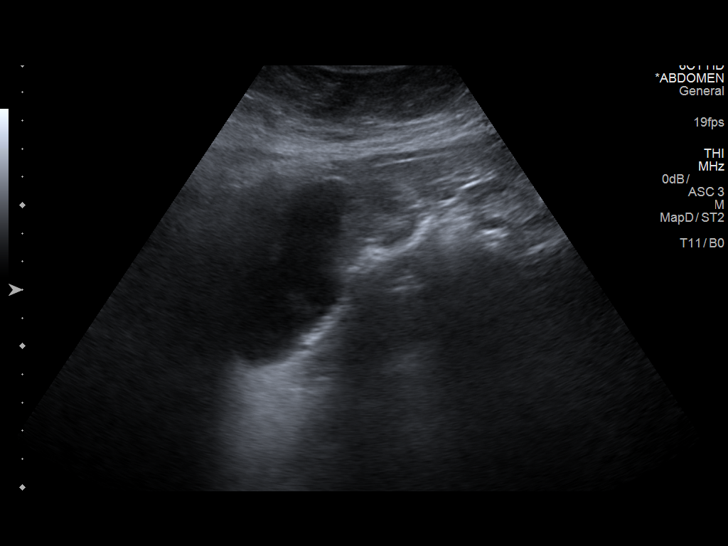
[im 3/26]
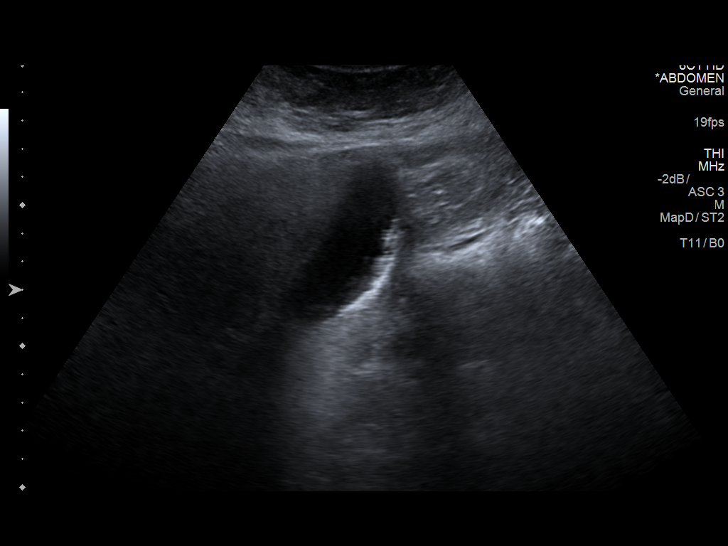
[im 5/26]
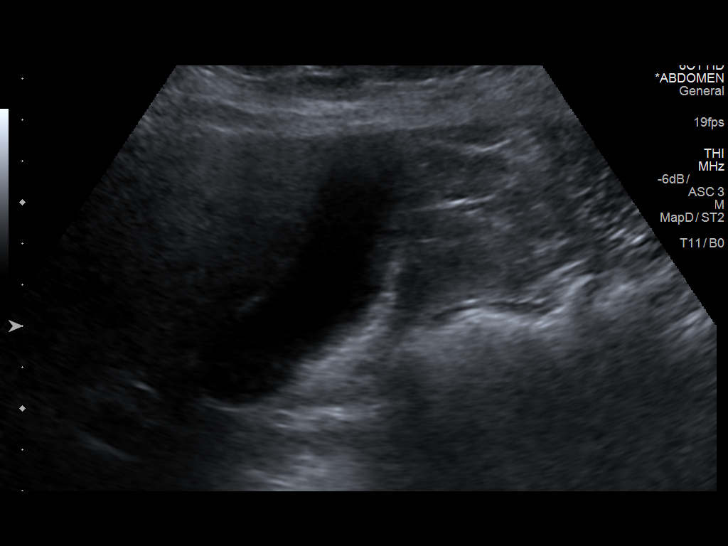
[im 7/26]
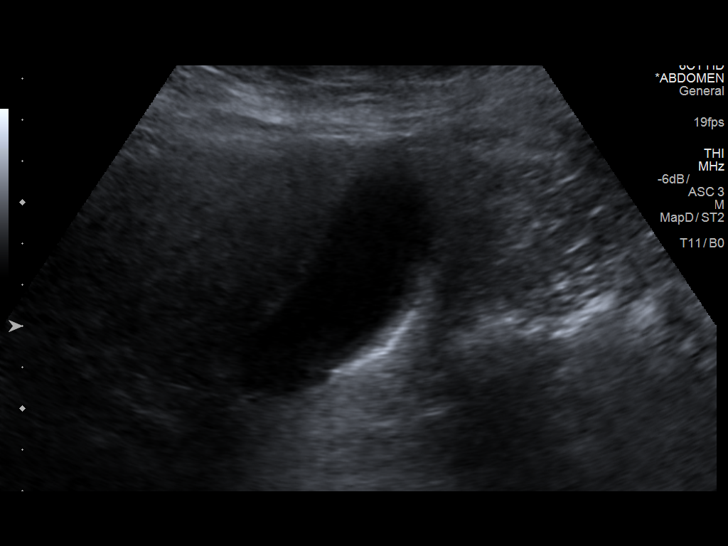
[im 9/26]
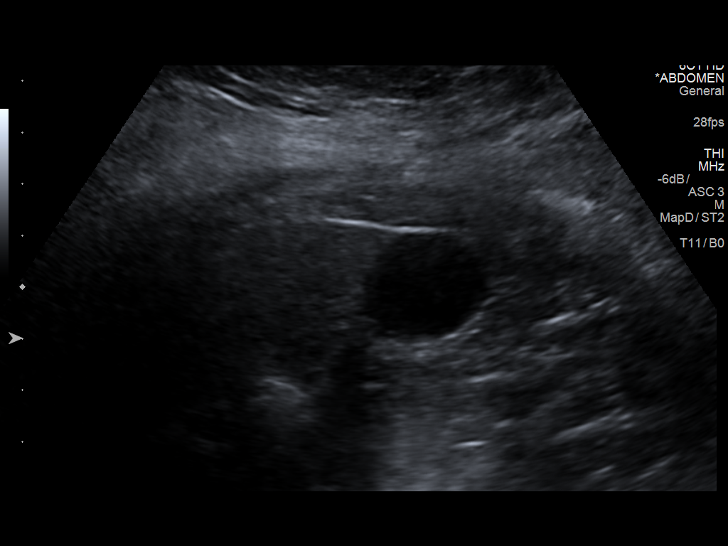
[im 10/26]
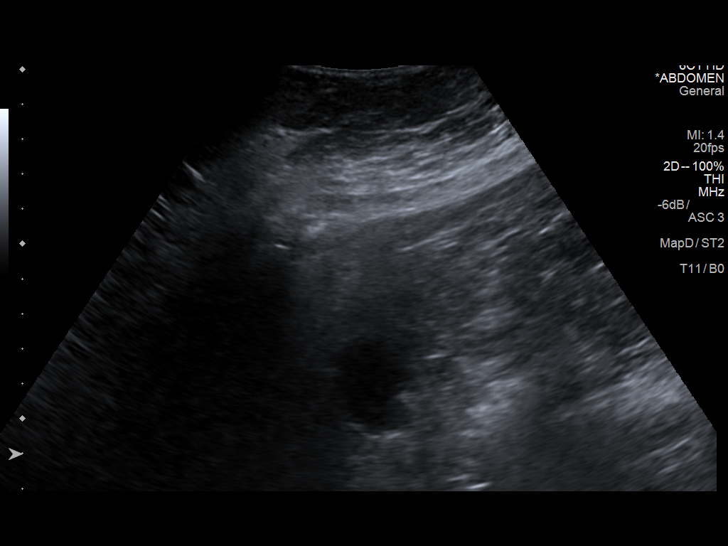
[im 12/26]
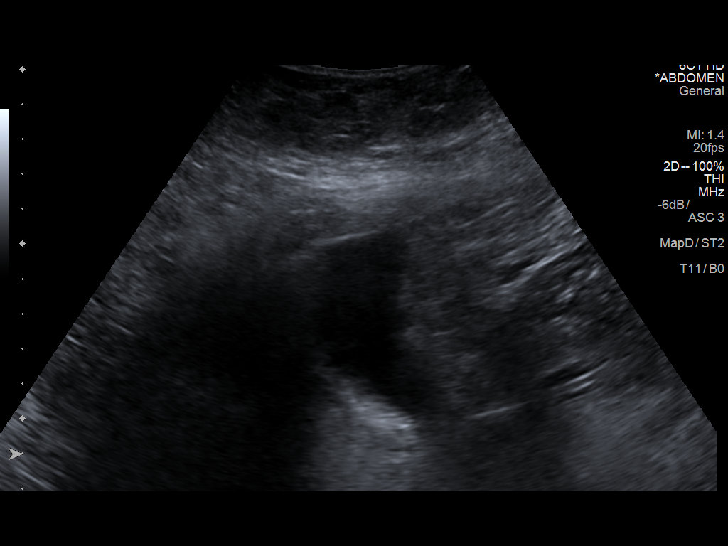
[im 14/26]
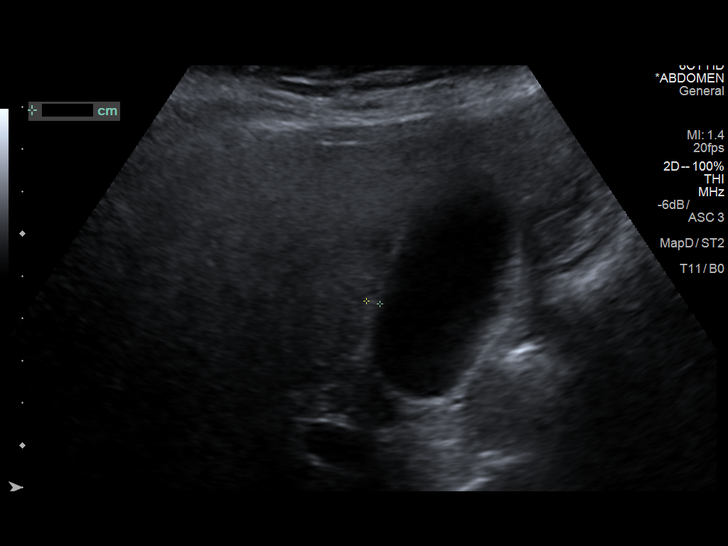
[im 16/26]
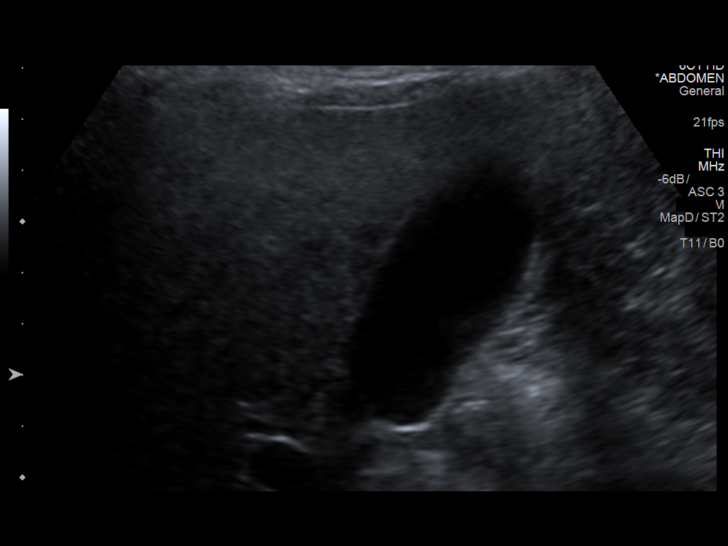
[im 17/26]
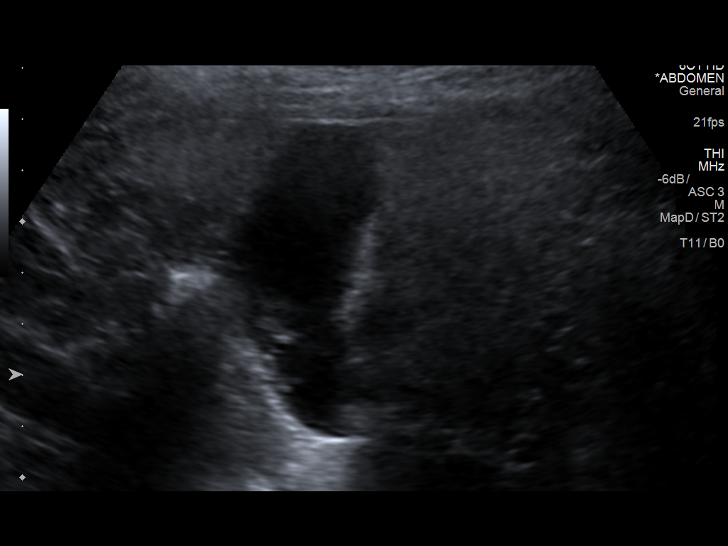
[im 19/26]
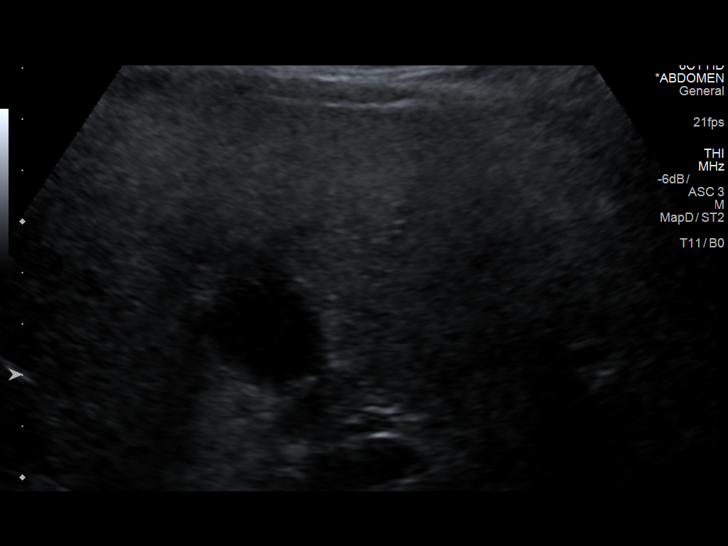
[im 21/26]
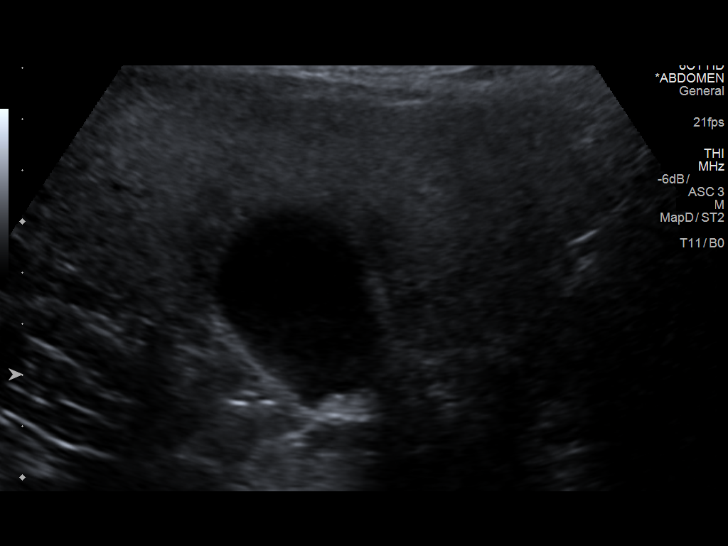
[im 23/26]
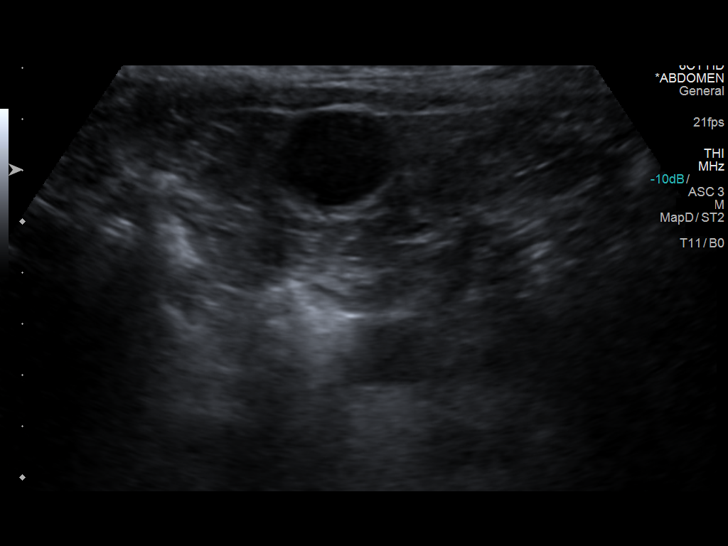
[im 26/26]
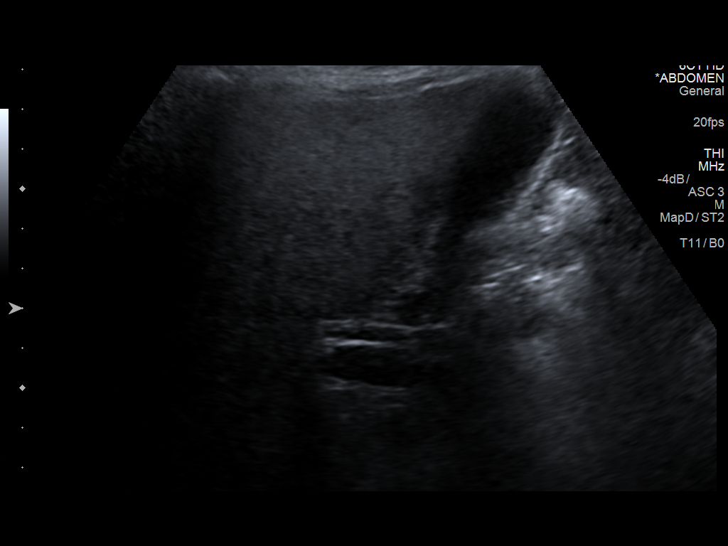

[14 of 25 positions shown; findings below may reference images not displayed]

FINDINGS: Gallbladder:

Multiple small stones layering in the dependent gallbladder. No
gallbladder wall thickening or edema. Murphy's sign is positive.

Common bile duct:

Diameter: 4 mm, normal

Liver:

Diffusely increased parenchymal echotexture consistent with diffuse
fatty infiltration. Some focal sparing adjacent to the gallbladder
fossa.
IMPRESSION: Cholelithiasis with positive Murphy's sign. These findings are
consistent with acute cholecystitis in the appropriate clinical
setting. Diffuse fatty infiltration of the liver.

## 2019-12-30 ENCOUNTER — Other Ambulatory Visit: Payer: Self-pay

## 2019-12-30 NOTE — Patient Outreach (Signed)
[facility]) Care Management  12/30/2019  [name] 12-Feb-1951 [id number]   Telephone Assessment   Unsuccessful outreach attempt to patient.    Plan: RN CM will make outreach attempt to patient within the month of Oct.   [name] Management Telephonic Care Management Coordinator Direct Phone: [phone] Toll Free: [phone] Fax: [phone]

## 2020-01-06 ENCOUNTER — Telehealth: Payer: Self-pay

## 2020-01-06 NOTE — Progress Notes (Signed)
Called  Dr. Avis Epley office  to see if patient has visit scheduled with them and to  ask what the f/u plan is for patient. patient was not sure what to expect. Per Dr. Cecille Rubin [place] office patient her last appointment was 12/02/19 and needs to follow up in 4 months . Per Dr. Lynnell Dike office, they will contact her to schedule appointment .  Informed patient regarding this information.  Georgiana Shore ,[facility] Pharmacist Assistant [phone]

## 2020-01-09 DIAGNOSIS — E1129 Type 2 diabetes mellitus with other diabetic kidney complication: Secondary | ICD-10-CM | POA: Diagnosis not present

## 2020-01-13 ENCOUNTER — Ambulatory Visit (INDEPENDENT_AMBULATORY_CARE_PROVIDER_SITE_OTHER): Payer: Medicare HMO

## 2020-01-13 ENCOUNTER — Other Ambulatory Visit: Payer: Self-pay

## 2020-01-13 ENCOUNTER — Ambulatory Visit (INDEPENDENT_AMBULATORY_CARE_PROVIDER_SITE_OTHER): Payer: Medicare HMO | Admitting: Podiatry

## 2020-01-13 DIAGNOSIS — E0843 Diabetes mellitus due to underlying condition with diabetic autonomic (poly)neuropathy: Secondary | ICD-10-CM | POA: Diagnosis not present

## 2020-01-13 DIAGNOSIS — M19079 Primary osteoarthritis, unspecified ankle and foot: Secondary | ICD-10-CM

## 2020-01-13 DIAGNOSIS — L84 Corns and callosities: Secondary | ICD-10-CM

## 2020-01-13 DIAGNOSIS — M19071 Primary osteoarthritis, right ankle and foot: Secondary | ICD-10-CM

## 2020-01-13 DIAGNOSIS — R609 Edema, unspecified: Secondary | ICD-10-CM | POA: Diagnosis not present

## 2020-01-13 DIAGNOSIS — I739 Peripheral vascular disease, unspecified: Secondary | ICD-10-CM | POA: Diagnosis not present

## 2020-01-14 ENCOUNTER — Other Ambulatory Visit: Payer: Self-pay | Admitting: Specialist

## 2020-01-17 ENCOUNTER — Other Ambulatory Visit (HOSPITAL_COMMUNITY): Payer: Self-pay | Admitting: Podiatry

## 2020-01-17 ENCOUNTER — Encounter (HOSPITAL_COMMUNITY): Payer: Self-pay

## 2020-01-17 ENCOUNTER — Ambulatory Visit (HOSPITAL_COMMUNITY)
Admission: RE | Admit: 2020-01-17 | Discharge: 2020-01-17 | Disposition: A | Discharge status: Home / Self Care | Payer: Medicare HMO | Source: Ambulatory Visit | Attending: Podiatry | Admitting: Podiatry

## 2020-01-17 ENCOUNTER — Other Ambulatory Visit: Payer: Self-pay

## 2020-01-17 DIAGNOSIS — I739 Peripheral vascular disease, unspecified: Secondary | ICD-10-CM

## 2020-01-20 ENCOUNTER — Ambulatory Visit: Payer: Medicare HMO | Admitting: Internal Medicine

## 2020-01-20 ENCOUNTER — Other Ambulatory Visit: Payer: Self-pay

## 2020-01-20 NOTE — Patient Outreach (Addendum)
[facility]) Care Management  01/20/2020  MARLANE HIRSCHMANN 10-21-1950 [id number]   Telephone Assessment   Unsuccessful outreach attempt to patient.    Plan: RN CM will make outreach attempt to patient next month if no return call from patient. RN CM will send unsuccessful outreach letter to patient.   Enzo Montgomery, RN,BSN,CCM [facility] Telephonic Care Management Coordinator Direct Phone: [phone] Toll Free: [phone] Fax: [phone]

## 2020-01-20 NOTE — Progress Notes (Deleted)
Name: [name]  MRN/ DOB: [id number], 1951/01/01   Age/ Sex: 69 y.o., female    PCP: [name], MD   Reason for Endocrinology Evaluation: Type {NUMBERS 1 OR 2:522190} Diabetes Mellitus     Date of Initial Endocrinology Visit: 01/20/2020     PATIENT IDENTIFIER: [name] is a 69 y.o. female with a past medical history of ***. The patient presented for initial endocrinology clinic visit on 01/20/2020 for consultative assistance with her diabetes management.    HPI: Ms. Onofrio was    Diagnosed with DM *** Prior Medications tried/Intolerance: *** Currently checking blood sugars *** x / day,  before breakfast and ***.  Hypoglycemia episodes : ***               Symptoms: ***                 Frequency: ***/  Hemoglobin A1c has ranged from *** in ***, peaking at *** in ***. Patient required assistance for hypoglycemia:  Patient has required hospitalization within the last 1 year from hyper or hypoglycemia:   In terms of diet, the patient ***   HOME DIABETES REGIMEN: Basal: ***  Bolus: ***   Statin: {Yes/No:11203} ACE-I/ARB: {YES/NO:17245} Prior Diabetic Education: {Yes/No:11203}   METER DOWNLOAD SUMMARY: Date range evaluated: *** Fingerstick Blood Glucose Tests = *** Average Number Tests/Day = *** Overall Mean FS Glucose = *** Standard Deviation = ***  BG Ranges: Low = *** High = ***   Hypoglycemic Events/30 Days: BG < 50 = *** Episodes of symptomatic severe hypoglycemia = ***   DIABETIC COMPLICATIONS: Microvascular complications:   ***  Denies: ***  Last eye exam: Completed   Macrovascular complications:   ***  Denies: CAD, PVD, CVA   PAST HISTORY: Past Medical History:  Past Medical History:  Diagnosis Date  . Anginal pain ([place]) 03/19/2012   saw Dr. Cathie Olden .. she thinks its more related to stomach issues  . Arthritis    "all over; mostly in my back" (03/20/2012)  . Breast cancer ([place])    "right" (03/20/2012)  .  Cardiomyopathy   . Chest pain 06/2005   Hospitalized, dystolic dysfunction  . Chronic lower back pain    "have 2 herniated discs; going to have to have a fusion" (03/20/2012)  . CKD (chronic kidney disease), stage III   . Family history of anesthesia complication    "father has nausea and vomiting"  . Gallstones   . GERD (gastroesophageal reflux disease)   . H/O hiatal hernia   . History of kidney stones   . History of right mastectomy   . Hypertension    "patient states never had HTN, takes Hyzaar for heart  . Hypothyroidism   . Irritable bowel syndrome   . Migraines    "it's been a long time since I've had one" (03/20/2012)  . Osteoarthritis   . Peripheral neuropathy   . PONV (postoperative nausea and vomiting)   . Sciatica   . Type II diabetes mellitus ([place])     Past Surgical History:  Past Surgical History:  Procedure Laterality Date  . ABDOMINAL HYSTERECTOMY  1993  . adenosin cardiolite  07/2005   (+) wall motion abnormality  . AXILLARY LYMPH NODE DISSECTION  2003   squamous cell cancer  . BACK SURGERY    . CARDIAC CATHETERIZATION  ? 2005 / 2007  . CHOLECYSTECTOMY N/A 01/24/2018   Procedure: LAPAROSCOPIC CHOLECYSTECTOMY;  Surgeon: Erroll Luna, MD;  Location: [facility];  Service: General;  Laterality: N/A;  . CT abdomen and pelvis  02/2010   Same  . ESOPHAGOGASTRODUODENOSCOPY  2005   GERD  . KNEE ARTHROSCOPY  1990's   "right" (03/20/2012)  . LUMBAR FUSION N/A 08/22/2014   Procedure: Right L2-3 and right L1-2 transforaminal lumbar interbody fusion with pedicle screws, rods, sleeves, cages, local bone graft, Vivigen, cancellous chips;  Surgeon: Jessy Oto, MD;  Location: [facility];  Service: Orthopedics;  Laterality: N/A;  . LUMBAR LAMINECTOMY/DECOMPRESSION MICRODISCECTOMY N/A 02/04/2014   Procedure: RIGHT L2-3 MICRODISCECTOMY ;  Surgeon: Jessy Oto, MD;  Location: [facility];  Service: Orthopedics;  Laterality: N/A;  . MASTECTOMY Right   . MASTECTOMY WITH AXILLARY  LYMPH NODE DISSECTION  12/2003   "right" (03/20/2012)  . POSTERIOR CERVICAL FUSION/FORAMINOTOMY  2001  . SHOULDER ARTHROSCOPY W/ ROTATOR CUFF REPAIR  2003   Left  . THYROIDECTOMY  1977   Right  . TOTAL KNEE ARTHROPLASTY  2000   Right  . [place] of abdomen  07/2005   Fatty liver / kidney stones      Social History:  reports that she has never smoked. She has never used smokeless tobacco. She reports that she does not drink alcohol and does not use drugs. Family History:  Family History  Problem Relation Age of Onset  . Hypertension Mother   . Breast cancer Mother 33  . Diabetes Father   . Hypertension Father   . Hypertension Sister   . Hypertension Brother   . Hypertension Brother   . Diabetes Brother   . Diabetes Brother   . Diabetes Other        Family Hx. of Diabetes  . Hypertension Other        Family History of HTN  . Deep vein thrombosis Daughter   . Breast cancer Maternal Aunt   . Colon cancer Neg Hx   . Esophageal cancer Neg Hx   . Liver cancer Neg Hx   . Rectal cancer Neg Hx   . Stomach cancer Neg Hx       HOME MEDICATIONS: Allergies as of 01/20/2020      Reactions   Dilaudid [hydromorphone Hcl] Nausea And Vomiting   Hydromorphone    Lipitor [atorvastatin] Other (See Comments)   Body & Muscle Aches   Adhesive [tape] Rash   Ampicillin Rash, Other (See Comments)   REACTION: Rash and welts on her hands when she took it in the hospital   Latex Itching, Dermatitis, Rash, Other (See Comments)   REACTION: Patient had a reaction to tape after surgery and they told her she was allergic to Latex   Penicillins Rash, Other (See Comments)   Has patient had a PCN reaction causing immediate rash, facial/tongue/throat swelling, SOB or lightheadedness with hypotension: No Has patient had a PCN reaction causing severe rash involving mucus membranes or skin necrosis: No Has patient had a PCN reaction that required hospitalization: Yes - in hospital receiving treatment Has  patient had a PCN reaction occurring within the last 10 years: No If all of the above answers are "NO", then may proceed with Cephalosporin use.      Medication List       Accurate as of January 20, 2020 12:12 PM. If you have any questions, ask your nurse or doctor.        acetaminophen 500 MG tablet Commonly known as: TYLENOL Take 1,000 mg by mouth every 6 (six) hours as needed for moderate pain or headache.   amLODipine  10 MG tablet Commonly known as: NORVASC TAKE 1 TABLET BY MOUTH EVERY DAY   aspirin 81 MG tablet Take 81 mg by mouth daily.   B-D SINGLE USE SWABS REGULAR Pads Use as directed.   carvedilol 6.25 MG tablet Commonly known as: COREG Take 1 tablet (6.25 mg total) by mouth 2 (two) times daily with a meal.   cyclobenzaprine 5 MG tablet Commonly known as: FLEXERIL Take 1 tablet (5 mg total) by mouth 2 (two) times daily as needed for muscle spasms.   Dexcom G6 Receiver Devi 1 Piece by Does not apply route as needed.   Dexcom G6 Sensor Misc 4 Pieces by Does not apply route once a week.   Dexcom G6 Transmitter Misc 1 Piece by Does not apply route as directed.   diclofenac Sodium 1 % Gel Commonly known as: VOLTAREN Apply 4 g topically 4 (four) times daily as needed. What changed: reasons to take this   ferrous gluconate 324 MG tablet Commonly known as: FERGON TAKE 1 TABLET (324 MG TOTAL) BY MOUTH 2 (TWO) TIMES DAILY WITH A MEAL.   furosemide 40 MG tablet Commonly known as: LASIX TAKE 1 TABLET (40 MG TOTAL) BY MOUTH DAILY AS NEEDED (LEG SWELLING). What changed: additional instructions   glipiZIDE 5 MG 24 hr tablet Commonly known as: GLUCOTROL XL TAKE 1 TABLET BY MOUTH EVERY DAY WITH BREAKFAST   hydrALAZINE 50 MG tablet Commonly known as: APRESOLINE Patient taking 73m 3x/day   HYDROcodone-acetaminophen 5-325 MG tablet Commonly known as: NORCO/VICODIN Take 1 tablet by mouth every 6 (six) hours as needed for moderate pain.   insulin NPH-regular  Human (70-30) 100 UNIT/ML injection Inject 40 Units into the skin See admin instructions. 40 units with breakfast and 40 units with supper for BG above 90   levothyroxine 100 MCG tablet Commonly known as: SYNTHROID Take 1 tablet (100 mcg total) by mouth daily.   losartan 100 MG tablet Commonly known as: COZAAR   metoCLOPramide 10 MG tablet Commonly known as: REGLAN Take 1 tablet (10 mg total) by mouth 4 (four) times daily.   ondansetron 4 MG tablet Commonly known as: ZOFRAN Take 4 mg by mouth every 8 (eight) hours as needed for nausea or vomiting.   pantoprazole 40 MG tablet Commonly known as: PROTONIX Take 1 tablet (40 mg total) by mouth 2 (two) times daily. What changed:   when to take this  reasons to take this   polyethylene glycol 17 g packet Commonly known as: MIRALAX / GLYCOLAX Take 17 g by mouth daily as needed for moderate constipation.   rosuvastatin 20 MG tablet Commonly known as: CRESTOR Take 1 tablet (20 mg total) by mouth daily at 6 PM.   Systane 0.4-0.3 % Soln Generic drug: Polyethyl Glycol-Propyl Glycol Apply to eye. Taking 2x/day   traMADol 50 MG tablet Commonly known as: ULTRAM TAKE 2 TABLETS (100 MG TOTAL) BY MOUTH EVERY 6 (SIX) HOURS AS NEEDED FOR UP TO 7 DAYS.   True Metrix Air Glucose Meter w/Device Kit 1 kit by Does not apply route 3 (three) times daily.   True Metrix Blood Glucose Test test strip Generic drug: glucose blood Use as instructed   True Metrix Level 2 Normal Soln Use as directed   TRUEplus Lancets 33G Misc Use as directed        ALLERGIES: Allergies  Allergen Reactions  . Dilaudid [Hydromorphone Hcl] Nausea And Vomiting  . Hydromorphone   . Lipitor [Atorvastatin] Other (See Comments)    Body &  Muscle Aches  . Adhesive [Tape] Rash  . Ampicillin Rash and Other (See Comments)    REACTION: Rash and welts on her hands when she took it in the hospital  . Latex Itching, Dermatitis, Rash and Other (See Comments)     REACTION: Patient had a reaction to tape after surgery and they told her she was allergic to Latex   . Penicillins Rash and Other (See Comments)    Has patient had a PCN reaction causing immediate rash, facial/tongue/throat swelling, SOB or lightheadedness with hypotension: No Has patient had a PCN reaction causing severe rash involving mucus membranes or skin necrosis: No Has patient had a PCN reaction that required hospitalization: Yes - in hospital receiving treatment Has patient had a PCN reaction occurring within the last 10 years: No If all of the above answers are "NO", then may proceed with Cephalosporin use.      REVIEW OF SYSTEMS: A comprehensive ROS was conducted with the patient and is negative except as per HPI and below:  ROS    OBJECTIVE:   VITAL SIGNS: There were no vitals taken for this visit.   PHYSICAL EXAM:  General: Pt appears well and is in NAD  Hydration: Well-hydrated with moist mucous membranes and good skin turgor  HEENT: Head: Unremarkable with good dentition. Oropharynx clear without exudate.  Eyes: External eye exam normal without stare, lid lag or exophthalmos.  EOM intact.  PERRL.  Neck: General: Supple without adenopathy or carotid bruits. Thyroid: Thyroid size normal.  No goiter or nodules appreciated. No thyroid bruit.  Lungs: Clear with good BS bilat with no rales, rhonchi, or wheezes  Heart: RRR with normal S1 and S2 and no gallops; no murmurs; no rub  Abdomen: Normoactive bowel sounds, soft, nontender, without masses or organomegaly palpable  Extremities:  Lower extremities - No pretibial edema. No lesions.  Skin: Normal texture and temperature to palpation. No rash noted. No Acanthosis nigricans/skin tags. No lipohypertrophy.  Neuro: MS is good with appropriate affect, pt is alert and Ox3    DM foot exam:    DATA REVIEWED:  Lab Results  Component Value Date   HGBA1C 6.9 (A) 12/05/2019   HGBA1C 8.4 (H) 08/05/2019   HGBA1C 8.9 (A)  07/03/2019   Lab Results  Component Value Date   MICROALBUR 570.4 08/05/2019   LDLCALC 79 08/05/2019   CREATININE 1.68 (H) 10/28/2019   Lab Results  Component Value Date   MICRALBCREAT 3,395 (H) 08/05/2019    Lab Results  Component Value Date   CHOL 155 08/05/2019   HDL 41 (L) 08/05/2019   LDLCALC 79 08/05/2019   TRIG 269 (H) 08/05/2019   CHOLHDL 3.8 08/05/2019        ASSESSMENT / PLAN / RECOMMENDATIONS:   1) Type *** Diabetes Mellitus, ***controlled, With*** complications - Most recent A1c of *** %. Goal A1c < *** %.  ***  Plan: GENERAL:  ***  MEDICATIONS:  ***  EDUCATION / INSTRUCTIONS:  BG monitoring instructions: Patient is instructed to check her blood sugars *** times a day, ***.  Call [facility] if: BG persistently < 70 or > 300. . I reviewed the Rule of 15 for the treatment of hypoglycemia in detail with the patient. Literature supplied.   2) Diabetic complications:   Eye: Does *** have known diabetic retinopathy.   Neuro/ Feet: Does *** have known diabetic peripheral neuropathy.  Renal: Patient does *** have known baseline CKD. She is *** on an ACEI/ARB at present.Check  urine albumin/creatinine ratio yearly starting at time of diagnosis. If albuminuria is positive, treatment is geared toward better glucose, blood pressure control and use of ACE inhibitors or ARBs. Monitor electrolytes and creatinine once to twice yearly.   3) Lipids: Patient is *** on a statin.    4) Hypertension: ***  at goal of < 140/90 mmHg.       Signed electronically by: Mack Guise, MD  [facility]  [facility] [address] [place], [place] [address] Phone: [phone] FAX: [phone]   CC: [name], MD [address] [place] [address] Phone: [phone]  Fax: [phone]    Return to Endocrinology clinic as below: Future Appointments  Date Time Provider [place]  01/20/2020  1:40 PM Arva Slaugh, Melanie Crazier, MD [facility] None  02/21/2020 11:00 AM [facility] PHARMACIST [facility]  03/09/2020 11:00 AM Brita Romp, NP [facility] None  03/25/2020 12:00 PM [facility] DX DEXA 1 [facility]

## 2020-01-21 NOTE — Progress Notes (Signed)
Subjective: 69 year old female presents the office with concerns of right ankle pain, discoloration to both of her ankles.  Also she has calluses to both of her big toes. She needs some intermittent discomfort of the right ankle but denies any recent injury or falls.  She gets some swelling to the ankles as well.  She said no recent treatment. Denies any systemic complaints such as fevers, chills, nausea, vomiting. No acute changes since last appointment, and no other complaints at this time.   Objective: AAO x3, NAD DP/PT pulses palpable bilaterally, CRT less than 3 seconds Darkening discoloration present bilateral ankles with the right side worse than left there is chronic edema present.  There is no open lesions.  Mild tenderness to the right ankle.  There is no pain with ankle range of motion. There is pinpoint tenderness.  Flexor, extensor tendons appear to be intact.  MMT 5/5. Hyperkeratotic lesion medial hallux bilateral. Bunion, hammertoe deformities are evident. No pain with calf compression, swelling, warmth, erythema  Assessment: Right ankle pain, skin discoloration; preulcerative calluses  Plan: -All treatment options discussed with the patient including all alternatives, risks, complications.  -X-rays obtained reviewed.  No evidence of acute fracture identified today.  Likely old fibular fracture.  Posterior calcaneal spurs present with the hardware intact from prior surgery.  Mild joint space narrowing of the ankle. -On chronic ankle pain and discoloration order arterial duplex to ensure adequate circulation.  Likely component of venous insufficiency resulting in the skin discoloration.  Encourage compression socks, elevation.  Also discussed wearing supportive shoes. -Debrided hyperkeratotic lesion without any complications or bleeding x2  [name] DPM   -Patient encouraged to call the office with any questions, concerns, change in symptoms.

## 2020-01-24 ENCOUNTER — Telehealth: Payer: Self-pay | Admitting: Family Medicine

## 2020-01-24 MED ORDER — ONDANSETRON HCL 4 MG PO TABS: 4.0000 mg | ORAL_TABLET | Freq: Three times a day (TID) | ORAL | 1 refills | Status: DC | PRN

## 2020-01-24 NOTE — Telephone Encounter (Signed)
Pt called in asking for a refill on the Ondansetron 4mg  tablets, pt uses [facility] on [address].   Please advise

## 2020-01-24 NOTE — Telephone Encounter (Signed)
Ok to refill.  #30, 1 refill

## 2020-01-24 NOTE — Telephone Encounter (Signed)
Medication filled to pharmacy as requested.   

## 2020-01-24 NOTE — Telephone Encounter (Signed)
[facility] for refill? I do not see your name on this.

## 2020-02-03 DIAGNOSIS — N1832 Chronic kidney disease, stage 3b: Secondary | ICD-10-CM | POA: Diagnosis not present

## 2020-02-18 ENCOUNTER — Other Ambulatory Visit: Payer: Self-pay

## 2020-02-18 NOTE — Patient Outreach (Signed)
[facility]) Care Management  02/18/2020  [name] 1950/12/11 [id number]   Telephone Assessment  Outreach attempt # 1 to patient. Spoke with patient who reports she has been doing fairly well. She voices that she continues to have chronic back pain. She has been seeing specialist but no definitve cause and treatment has been established. She reports she is scheduled to have a bone density test soon. Patient is considering getting second opinion regarding back issues. She also states that she has been having some "draining in the back of throat" and possible heartburn sxs. She does not have anything in the home to take and encouraged patient to contact PCP office to discuss sxs and med options. She voiced understanding and will do so. Appetite remains WNL for patient. Blood sugars fairly controlled. Last A1C on file 6.9(Aug 2021). She denies any RN CM needs or concerns at this time.    Medications Reviewed Today    Reviewed by Hayden Pedro, RN (Registered Nurse) on 02/18/20 at 1227  Med List Status: <None>  Medication Order Taking? Sig Documenting Provider Last Dose Status Informant  acetaminophen (TYLENOL) 500 MG tablet [id number] No Take 1,000 mg by mouth every 6 (six) hours as needed for moderate pain or headache. [provider] Taking Active Self  Alcohol Swabs (B-D SINGLE USE SWABS REGULAR) PADS [id number] No Use as directed. [name], MD Taking Active Self  amLODipine (NORVASC) 10 MG tablet [id number] No TAKE 1 TABLET BY MOUTH EVERY DAY  Patient taking differently: Take 10 mg by mouth daily.    Susy Frizzle, MD Taking Active Self  aspirin 81 MG tablet [id number] No Take 81 mg by mouth daily. [provider] Taking Active Self  Blood Glucose Calibration (TRUE METRIX LEVEL 2) Normal SOLN [id number] No Use as directed Susy Frizzle, MD Taking Active Self  Blood Glucose Monitoring Suppl (TRUE METRIX AIR GLUCOSE METER)  w/Device KIT [id number] No 1 kit by Does not apply route 3 (three) times daily. [name], MD Taking Active Self  carvedilol (COREG) 6.25 MG tablet [id number] No Take 1 tablet (6.25 mg total) by mouth 2 (two) times daily with a meal. [name], MD Taking Active Self           Med Note (HOOKS, DAVIUS C   Sat Oct 19, 2019  4:03 AM) Pt takes upon waking in the morning and in the evening at supper. No specified time.  Continuous Blood Gluc Receiver (DEXCOM G6 RECEIVER) DEVI [id number] No 1 Piece by Does not apply route as needed. Cassandria Anger, MD Taking Active   Continuous Blood Gluc Sensor (DEXCOM G6 SENSOR) MISC [id number] No 4 Pieces by Does not apply route once a week. Cassandria Anger, MD Taking Active   Continuous Blood Gluc Transmit (DEXCOM G6 TRANSMITTER) MISC [id number] No 1 Piece by Does not apply route as directed. Cassandria Anger, MD Taking Active   cyclobenzaprine (FLEXERIL) 5 MG tablet [id number] No Take 1 tablet (5 mg total) by mouth 2 (two) times daily as needed for muscle spasms. Ripley Fraise, MD Taking Active   diclofenac Sodium (VOLTAREN) 1 % GEL [id number] No Apply 4 g topically 4 (four) times daily as needed.  Patient taking differently: Apply 4 g topically 4 (four) times daily as needed (pain).    Desiree Hane, MD Taking Active Self  ferrous gluconate (FERGON) 324 MG tablet [id number] No TAKE 1 TABLET (324 MG TOTAL) BY MOUTH 2 (TWO) TIMES  DAILY WITH A MEAL. Julian Hy, DO Taking Active   furosemide (LASIX) 40 MG tablet [id number] No TAKE 1 TABLET (40 MG TOTAL) BY MOUTH DAILY AS NEEDED (LEG SWELLING).  Patient taking differently: Take 40 mg by mouth daily as needed (leg swelling). Patient taking 51m daily   PSusy Frizzle MD Taking Active Self  glipiZIDE (GLUCOTROL XL) 5 MG 24 hr tablet [id number] TAKE 1 TABLET BY MOUTH EVERY DAY WITH BREAKFAST Nida, GMarella Chimes MD  Active   glucose blood (TRUE METRIX BLOOD GLUCOSE TEST) test strip  [id number] Use as instructed TMidge Minium MD Taking Active Self  hydrALAZINE (APRESOLINE) 50 MG tablet [id number] Patient taking 250m3x/day [provider] Taking Active Self  HYDROcodone-acetaminophen (NORCO/VICODIN) 5-325 MG tablet [id number] Take 1 tablet by mouth every 6 (six) hours as needed for moderate pain. NiJessy OtoMD Taking Active   insulin NPH-regular Human (70-30) 100 UNIT/ML injection [id number] Inject 40 Units into the skin See admin instructions. 40 units with breakfast and 40 units with supper for BG above 90 [provider] Taking Active Self           Med Note ([facility]   Wed Oct 09, 2019  7:05 AM) Took 40 units  levothyroxine (SYNTHROID) 100 MCG tablet [id number] Take 1 tablet (100 mcg total) by mouth daily. TaMidge MiniumMD Taking Active Self  losartan (COZAAR) 100 MG tablet [id number]  [provider] Taking Active            Med Note (BMaryruth EveJESSICA L   Fri Dec 13, 2019 10:34 AM) Pt says she takes 1/2 of a 2515mablet  metoCLOPramide (REGLAN) 10 MG tablet [id number] Take 1 tablet (10 mg total) by mouth 4 (four) times daily. TabMidge MiniumD Taking Active Self  ondansetron ([facility] MG tablet [id number] 1 tablet (4 mg total) by mouth every 8 (eight) hours as needed for nausea or vomiting. TabMidge MiniumD  Active   pantoprazole (PROTONIX) 40 MG tablet [id number] Take 1 tablet (40 mg total) by mouth 2 (two) times daily.  Patient taking differently: Take 40 mg by mouth 2 (two) times daily as needed (acid reflux).    TabMidge MiniumD Taking Active Self  Polyethyl Glycol-Propyl Glycol (SYSTANE) 0.4-0.3 % SOLN [id number] Apply to eye. Taking 2x/day [provider] Taking Active Self  polyethylene glycol (MIRALAX / GLYCOLAX) packet [id number] Take 17 g by mouth daily as needed for moderate constipation. [provider] Taking Active Self  rosuvastatin (CRESTOR)  20 MG tablet [id number] Take 1 tablet (20 mg total) by mouth daily at 6 PM. TabMidge MiniumD Taking Active Self  traMADol (ULTRAM) 50 MG tablet [id number] 2 TABLETS (100 MG TOTAL) BY MOUTH EVERY 6 (SIX) HOURS AS NEEDED FOR UP TO 7 DAYS. NitJessy OtoD  Active   TRUEplus Lancets 33G MISC [id number] Use as directed TabMidge MiniumD Taking Active Self         Goals Addressed            This Visit's Progress   . Keep Pain Under Control       Follow Up Date Jan 2022   - develop a personal pain management plan - plan exercise or activity when pain is best controlled - prioritize tasks for the day - track times pain is worst and when  it is best - track what makes the pain worse and what makes it better - use ice or heat for pain relief - work slower and less intense when having pain    Why is this important?   Day-to-day life can be hard when you have back pain.  Pain medicine is just one piece of the treatment puzzle. There are many things you can do to manage pain and keep your back strong.   Lifestyle changes, like stopping smoking and eating foods with Vitamin D and calcium, keep your bones and muscles healthy. Your back is better when it is supported by strong muscles.  You can try these action steps to help you manage your pain.     Notes:     [name] Kitchen Monitor and Manage My Blood Sugar       Follow Up Date Jan 2022   - check blood sugar at prescribed times - check blood sugar if I feel it is too high or too low - take the blood sugar meter to all doctor visits    Why is this important?   Checking your blood sugar at home helps to keep it from getting very high or very low.  Writing the results in a diary or log helps the doctor know how to care for you.  Your blood sugar log should have the time, date and the results.  Also, write down the amount of insulin or other medicine that you take.  Other information, like what you ate, exercise done and how you  were feeling, will also be helpful.     Notes:     . Set My Target A1C       Follow Up Date *Feb 2022   - set target A1C-maintain level of 7.0 or less    Why is this important?   Your target A1C is decided together by you and your doctor.  It is based on several things like your age and other health issues.    Notes:         Plan: RN CM discussed with patient next outreach within the month of  Jan. Patient gave verbal consent and in agreement with RN CM follow up and timeframe. Patient aware that they may contact RN CM sooner for any issues or concerns.  RN CM will send quarterly update to PCP.   Enzo Montgomery, RN,BSN,CCM [facility] Telephonic Care Management Coordinator Direct Phone: [phone] Toll Free: [phone] Fax: [phone]

## 2020-02-20 ENCOUNTER — Ambulatory Visit: Payer: Self-pay

## 2020-02-21 ENCOUNTER — Ambulatory Visit: Payer: Medicare HMO

## 2020-02-21 DIAGNOSIS — E1165 Type 2 diabetes mellitus with hyperglycemia: Secondary | ICD-10-CM

## 2020-02-21 DIAGNOSIS — IMO0002 Reserved for concepts with insufficient information to code with codable children: Secondary | ICD-10-CM

## 2020-02-21 NOTE — Progress Notes (Signed)
Chronic Care Management Pharmacy  Name: [name]  MRN: [id number] DOB: 10/08/1950  Chief Complaint/ HPI  [name],  69 y.o. , female presents for their Follow-Up CCM visit with the clinical pharmacist via telephone due to COVID-19 Pandemic.  PCP : [name], MD  Their chronic conditions include: Type 2 DM, HF, hyperparathyroidism, HLD, HTN, GERD  Patient Active Problem List   Diagnosis Date Noted  . Hyperparathyroidism ([place]) 12/13/2019  . Elevated hemoglobin ([place]) 12/13/2019  . Chronic renal impairment, stage 4 (severe) ([place]) 10/28/2019  . Anemia 10/28/2019  . Physical exam 06/26/2019  . Neck pain 04/18/2019  . Ascending aortic aneurysm ([place]) 04/17/2019  . Hypoxia   . Nonspecific chest pain 04/15/2019  . Neuropathic pain of right flank 12/27/2018  . Dysphagia 12/27/2018  . Postsurgical hypothyroidism 06/11/2018  . Vitamin D deficiency 06/11/2018  . Multinodular goiter 06/11/2018  . Hypercalcemia 06/11/2018  . DM type 2 causing vascular disease ([place])   . PONV (postoperative nausea and vomiting)   . Peripheral neuropathy   . Irritable bowel syndrome   . Hypertension   . History of right mastectomy   . H/O hiatal hernia   . GERD (gastroesophageal reflux disease)   . Family history of anesthesia complication   . Diabetes mellitus type 2, uncontrolled, with complications ([place])   . Lower extremity edema   . Chronic constipation 06/23/2015  . Morbid obesity ([place]) 06/12/2015  . Chronic diastolic heart failure ([place]) 11/17/2014  . Herniated nucleus pulposus, L2-3 right 02/04/2014    Class: Acute  . Spondylolisthesis of lumbar region 04/30/2012    Class: Chronic  . Lumbar stenosis with neurogenic claudication 04/30/2012    Class: Chronic  . SYNCOPE-CAROTID SINUS 09/16/2008  . OSTEOARTHRITIS 08/23/2006  . DDD (degenerative disc disease), cervical 08/23/2006  . [place] DISEASE, LUMBAR 08/23/2006  . Headache(784.0) 08/23/2006  . BREAST CANCER, HX OF  08/23/2006  . HYPERPARATHYROIDISM, HX OF 08/23/2006  . RENAL CALCULUS, HX OF 08/23/2006   Social History   Socioeconomic History  . Marital status: Married    Spouse name: Not on file  . Number of children: 1  . Years of education: Not on file  . Highest education level: Not on file  Occupational History  . Occupation: Retired since 2003  Tobacco Use  . Smoking status: Never Smoker  . Smokeless tobacco: Never Used  Vaping Use  . Vaping Use: Never used  Substance and Sexual Activity  . Alcohol use: No  . Drug use: No  . Sexual activity: Not on file  Other Topics Concern  . Not on file  Social History Narrative  . Not on file   Social Determinants of Health   Financial Resource Strain:   . Difficulty of Paying Living Expenses: Not on file  Food Insecurity: No Food Insecurity  . Worried About Charity fundraiser in the Last Year: Never true  . Ran Out of Food in the Last Year: Never true  Transportation Needs: No Transportation Needs  . Lack of Transportation (Medical): No  . Lack of Transportation (Non-Medical): No  Physical Activity:   . Days of Exercise per Week: Not on file  . Minutes of Exercise per Session: Not on file  Stress:   . Feeling of Stress : Not on file  Social Connections:   . Frequency of Communication with Friends and Family: Not on file  . Frequency of Social Gatherings with Friends and Family: Not on file  . Attends Religious  Services: Not on file  . Active Member of Clubs or Organizations: Not on file  . Attends Archivist Meetings: Not on file  . Marital Status: Not on file   Family History  Problem Relation Age of Onset  . Hypertension Mother   . Breast cancer Mother 36  . Diabetes Father   . Hypertension Father   . Hypertension Sister   . Hypertension Brother   . Hypertension Brother   . Diabetes Brother   . Diabetes Brother   . Diabetes Other        Family Hx. of Diabetes  . Hypertension Other        Family History of  HTN  . Deep vein thrombosis Daughter   . Breast cancer Maternal Aunt   . Colon cancer Neg Hx   . Esophageal cancer Neg Hx   . Liver cancer Neg Hx   . Rectal cancer Neg Hx   . Stomach cancer Neg Hx    Allergies  Allergen Reactions  . Dilaudid [Hydromorphone Hcl] Nausea And Vomiting  . Hydromorphone   . Lipitor [Atorvastatin] Other (See Comments)    Body & Muscle Aches  . Adhesive [Tape] Rash  . Ampicillin Rash and Other (See Comments)    REACTION: Rash and welts on her hands when she took it in the hospital  . Latex Itching, Dermatitis, Rash and Other (See Comments)    REACTION: Patient had a reaction to tape after surgery and they told her she was allergic to Latex   . Penicillins Rash and Other (See Comments)    Has patient had a PCN reaction causing immediate rash, facial/tongue/throat swelling, SOB or lightheadedness with hypotension: No Has patient had a PCN reaction causing severe rash involving mucus membranes or skin necrosis: No Has patient had a PCN reaction that required hospitalization: Yes - in hospital receiving treatment Has patient had a PCN reaction occurring within the last 10 years: No If all of the above answers are "NO", then may proceed with Cephalosporin use.    Outpatient Encounter Medications as of 02/21/2020  Medication Sig Note  . acetaminophen (TYLENOL) 500 MG tablet Take 1,000 mg by mouth every 6 (six) hours as needed for moderate pain or headache.   . Alcohol Swabs (B-D SINGLE USE SWABS REGULAR) PADS Use as directed.   [name] Kitchen amLODipine (NORVASC) 10 MG tablet TAKE 1 TABLET BY MOUTH EVERY DAY (Patient taking differently: Take 10 mg by mouth daily. )   . aspirin 81 MG tablet Take 81 mg by mouth daily.   . Blood Glucose Calibration (TRUE METRIX LEVEL 2) Normal SOLN Use as directed   . Blood Glucose Monitoring Suppl (TRUE METRIX AIR GLUCOSE METER) w/Device KIT 1 kit by Does not apply route 3 (three) times daily.   . carvedilol (COREG) 6.25 MG tablet Take 1  tablet (6.25 mg total) by mouth 2 (two) times daily with a meal. 10/19/2019: Pt takes upon waking in the morning and in the evening at supper. No specified time.  . Continuous Blood Gluc Receiver (DEXCOM G6 RECEIVER) DEVI 1 Piece by Does not apply route as needed.   . Continuous Blood Gluc Sensor (DEXCOM G6 SENSOR) MISC 4 Pieces by Does not apply route once a week.   . Continuous Blood Gluc Transmit (DEXCOM G6 TRANSMITTER) MISC 1 Piece by Does not apply route as directed.   . cyclobenzaprine (FLEXERIL) 5 MG tablet Take 1 tablet (5 mg total) by mouth 2 (two) times daily as needed for  muscle spasms.   . diclofenac Sodium (VOLTAREN) 1 % GEL Apply 4 g topically 4 (four) times daily as needed. (Patient taking differently: Apply 4 g topically 4 (four) times daily as needed (pain). )   . ferrous gluconate (FERGON) 324 MG tablet TAKE 1 TABLET (324 MG TOTAL) BY MOUTH 2 (TWO) TIMES DAILY WITH A MEAL.   . furosemide (LASIX) 40 MG tablet TAKE 1 TABLET (40 MG TOTAL) BY MOUTH DAILY AS NEEDED (LEG SWELLING). (Patient taking differently: Take 40 mg by mouth daily as needed (leg swelling). Patient taking 64m daily)   . glipiZIDE (GLUCOTROL XL) 5 MG 24 hr tablet TAKE 1 TABLET BY MOUTH EVERY DAY WITH BREAKFAST   . glucose blood (TRUE METRIX BLOOD GLUCOSE TEST) test strip Use as instructed   . hydrALAZINE (APRESOLINE) 50 MG tablet Patient taking 270m3x/day   . HYDROcodone-acetaminophen (NORCO/VICODIN) 5-325 MG tablet Take 1 tablet by mouth every 6 (six) hours as needed for moderate pain.   . [name] Kitchennsulin NPH-regular Human (70-30) 100 UNIT/ML injection Inject 40 Units into the skin See admin instructions. 40 units with breakfast and 40 units with supper for BG above 90 10/09/2019: Took 40 units  . levothyroxine (SYNTHROID) 100 MCG tablet Take 1 tablet (100 mcg total) by mouth daily.   . [name] Kitchenosartan (COZAAR) 100 MG tablet  12/13/2019: Pt says she takes 1/2 of a 258mablet  . metoCLOPramide (REGLAN) 10 MG tablet Take 1 tablet (10 mg  total) by mouth 4 (four) times daily.   . ondansetron (ZOFRAN) 4 MG tablet Take 1 tablet (4 mg total) by mouth every 8 (eight) hours as needed for nausea or vomiting.   . pantoprazole (PROTONIX) 40 MG tablet Take 1 tablet (40 mg total) by mouth 2 (two) times daily. (Patient taking differently: Take 40 mg by mouth 2 (two) times daily as needed (acid reflux). )   . Polyethyl Glycol-Propyl Glycol (SYSTANE) 0.4-0.3 % SOLN Apply to eye. Taking 2x/day   . polyethylene glycol (MIRALAX / GLYCOLAX) packet Take 17 g by mouth daily as needed for moderate constipation.   . rosuvastatin (CRESTOR) 20 MG tablet Take 1 tablet (20 mg total) by mouth daily at 6 PM.   . traMADol (ULTRAM) 50 MG tablet TAKE 2 TABLETS (100 MG TOTAL) BY MOUTH EVERY 6 (SIX) HOURS AS NEEDED FOR UP TO 7 DAYS.   . TRUEplus Lancets 33G MISC Use as directed    No facility-administered encounter medications on file as of 02/21/2020.   Current Diagnosis/Assessment: Goals Addressed   None    Hypertension / Heart failure   Office blood pressures are  BP Readings from Last 3 Encounters:  12/13/19 118/68  12/05/19 130/66  11/29/19 (!) 147/71   Not routinely checking BP at home. Echocardiogram 04/17/2019: LVEF 55 to 60%, severe LVH, asymmetric hypertrophy with thickened basilar septum.  Indeterminate diastolic function.  Normal LA, mildly enlarged RV with normal function, normal RA.  Mild MR, trivial PR.  Normal PASP.  Mild dilation of the ascending aorta.  Patient is currently taking:  Carvedilol 6.25 mg twice daily with meal (high blood pressure & heart failure)  Furosemide 40 mg as needed (removes excess water in the body / reduces swelling in legs)  Losartan 25 mg - Losartan 12.5 mg once daily (high blood pressure & reduces kidney damage / protein in the urine in patients with diabetes)  Amlodipine 10 mg daily (high blood pressure)  Denies dizziness, SOB, chest pain. We reviewed the indications and proper administration for  each  medication.  We discussed diet recommendations at length.   Plan Continue current medications and control with diet and exercise.   Diabetes   Recent Relevant Labs: Lab Results  Component Value Date/Time   HGBA1C 6.9 (A) 12/05/2019 10:45 AM   HGBA1C 8.4 (H) 08/05/2019 03:15 PM   HGBA1C 8.9 (A) 07/03/2019 03:54 PM   HGBA1C 10.3 (H) 03/01/2019 02:07 PM   MICROALBUR 570.4 08/05/2019 03:15 PM   MICROALBUR 188.7 06/01/2018 03:25 PM   Kidney Function Lab Results  Component Value Date/Time   CREATININE 1.68 (H) 10/28/2019 01:32 PM   CREATININE 2.25 (H) 10/19/2019 03:30 AM   CREATININE 1.64 (H) 09/19/2019 02:20 PM   CREATININE 1.55 (H) 08/05/2019 03:15 PM   CREATININE 1.39 (H) 03/01/2019 02:07 PM   GFR 36.33 (L) 10/28/2019 01:32 PM   GFRNONAA 21 (L) 10/19/2019 03:30 AM   GFRNONAA 31 (L) 09/19/2019 02:20 PM   GFRNONAA 38 (L) 03/01/2019 02:07 PM   GFRAA 25 (L) 10/19/2019 03:30 AM   GFRAA 36 (L) 09/19/2019 02:20 PM   GFRAA 44 (L) 03/01/2019 02:07 PM   11/2019 a1c of 6.9% down from 07/2019 a1c 8.4%. 120s-130s at home.  Taken off of metformin due to CKD. Denies any episodes of low blood sugar.  Patient is currently controlled on the following medications:   Insulin-NPH-regular Human (70-30) SQ as directed  Glipizide xl 5 mg daily (Type 2 Diabetes)  Reviewed CGM - spoke with DME supplier [employer], states [employer] medicare advantage would cover [place] 2 system - reader is $40 payment every 3-5 yrs. Sensors would be $30 a month. Also would require three insulin injections/day/ They will not cover dexcom systems   Plan Continue current medications and control with diet and exercise.   Acid reflux / gastroparesis / constipation    Mentions no longer being able to use TUMS due to unnecessary calcium exposure in CKD. Reports ongoing reflux, unable to confirm pantoprazole use.  Patient is currently controlled on the following medications:  . Pantoprazole 40 mg once daily (acid  reflux) . Metoclopramide 10 mg four times daily (helps reduce feeling full/bloated after you start eating, also helps with acid reflux)  We consistent use of pantoprazole first thing in the morning every morning.  Plan  Continue current medications and control with diet and exercise.  Hyperlipidemia   LDL goal <70      Component Value Date/Time   CHOL 155 08/05/2019 1515   TRIG 269 (H) 08/05/2019 1515   HDL 41 (L) 08/05/2019 1515   CHOLHDL 3.8 08/05/2019 1515   VLDL 42 (H) 04/17/2019 0227   LDLCALC 79 08/05/2019 1515   The 10-year ASCVD risk score Mikey Bussing DC Jr., et al., 2013) is: 18.8%   Values used to calculate the score:     Age: 33 years     Sex: Female     Is Non-Hispanic African American: Yes     Diabetic: Yes     Tobacco smoker: No     Systolic Blood Pressure: 361 mmHg     Is BP treated: Yes     HDL Cholesterol: 41 mg/dL     Total Cholesterol: 155 mg/dL   No recent Hx of rosuvastatin dispensing, not able to confirm routine use.  Currently not a goal on the following medications:  Rosuvastatin 20 mg daily (helps lower lipids/cholesterol and reduces risk of heart attack and stroke)  We discussed: DM diet including carb reduction plate method, routine exercise using fitness benefit through advantage plan.  Plan  RPH to contact [facility] to Ensure Rx on file.   Vaccines   Immunization History  Administered Date(s) Administered  . Fluad Quad(high Dose 65+) 12/13/2019  . Influenza Split 01/31/2011  . Influenza Whole 02/23/2007  . Influenza, High Dose Seasonal PF 01/10/2017  . Influenza,inj,Quad PF,6+ Mos 01/07/2014, 02/09/2016, 01/01/2018, 12/03/2018  . Influenza-Unspecified 01/10/2012  . PFIZER SARS-COV-2 Vaccination 05/24/2019, 06/18/2019  . Pneumococcal Conjugate-13 05/16/2019  . Pneumococcal Polysaccharide-23 08/25/2014  . Tdap 04/20/2011   Reports already receiving pfizer covid booster.  Plan  Recommended patient receive Shingrix vaccines.    Medication Management   Pt uses Patent attorney for all medications. Uses pill box? No.  Current insurance mail order provides lowest copay.   Plan  Continue current medication management strategy.  Follow up:   RPH: 3 month f/u telephone visit DM cgm BP ______________ Visit Information SDOH (Social Determinants of Health) assessments performed: Yes.  Future Appointments  Date Time Provider [place]  03/09/2020 11:00 AM Brita Romp, NP REA-REA None  03/25/2020 12:00 PM GI-BCG DX DEXA 1 GI-BCGDG GI-BREAST CE  04/24/2020  9:15 AM Florance, Tomasa Blase, RN THN-COM None  06/25/2020 10:00 AM [name], MD [facility] P[facility]   [name], Pharm.D., BCGP Clinical Pharmacist [facility] [phone]

## 2020-02-28 NOTE — Patient Instructions (Addendum)
[name],  I spoke with DME supplier called [employer] regarding continuous glucose monitors such as Freestyle and Dexcom. They said [employer] medicare advantage would cover [name] continuous glucose system - reader is ~$40 payment every 3-5 yrs. Sensors would be ~$30 a month. Also would require documentation of three insulin injections/day and most recent endocrinology note to confirm eligibility. Per [employer], Dexcom would NOT be covered through [facility].  I hope this helps - please call me at [phone] with any questions!  Thank you, Edyth Gunnels., Clinical Pharmacist  Goals Addressed            This Visit's Progress   . A1C <7.5%   On track    CARE PLAN ENTRY Current Barriers:  . Diabetes: type 2; complicated by chronic medical conditions including high blood pressure, chronic kidney disease. Lab Results  Component Value Date   HGBA1C 8.4 (H) 08/05/2019 .   Lab Results  Component Value Date   CREATININE 1.55 (H) 08/05/2019   CREATININE 2.07 (H) 04/20/2019   CREATININE 2.13 (H) 04/19/2019   Current diabetes regimen:  Glipizide XL 5 mg once daily    Insulin regular 70-3070 units with breakfast and 60 units with supper for BG above 90 . Denies hypoglycemic symptoms, including dizziness, lightheadedness, shaking, sweating  Pharmacist Clinical Goal(s):  [name] Kitchen Over the next 180 days, patient will work with PharmD and primary care provider to address diabetes. o A1c goal <7.5%2  Interventions: . Comprehensive medication review performed, medication list updated in electronic medical record . Inter-disciplinary care team collaboration  Patient Self Care Activities:  . Patient will check blood glucose daily , document, and provide at future appointments . Patient will take medications as prescribed . Patient will contact provider with any episodes of hypoglycemia . Patient will report any questions or concerns to provider  Initial goal documentation.       The patient verbalized understanding of instructions provided today and agreed to receive a mailed copy of patient instruction and/or educational materials. Telephone follow up appointment with pharmacy team member scheduled for: See next appointment with "Care Management Staff" under "What's Next" below.   [name], Pharm.D., BCGP Clinical Pharmacist [facility] [phone]

## 2020-03-01 ENCOUNTER — Encounter: Payer: Self-pay | Admitting: Family Medicine

## 2020-03-04 ENCOUNTER — Other Ambulatory Visit: Payer: Self-pay

## 2020-03-04 ENCOUNTER — Encounter: Payer: Self-pay | Admitting: Family Medicine

## 2020-03-04 ENCOUNTER — Ambulatory Visit (INDEPENDENT_AMBULATORY_CARE_PROVIDER_SITE_OTHER): Payer: Medicare HMO | Admitting: Family Medicine

## 2020-03-04 VITALS — BP 138/80 | HR 61 | Temp 97.7°F | Resp 20 | Ht 64.0 in | Wt 213.2 lb

## 2020-03-04 DIAGNOSIS — R079 Chest pain, unspecified: Secondary | ICD-10-CM | POA: Diagnosis not present

## 2020-03-04 DIAGNOSIS — L570 Actinic keratosis: Secondary | ICD-10-CM | POA: Diagnosis not present

## 2020-03-04 NOTE — Patient Instructions (Addendum)
Follow up as needed or as scheduled The spot on your back is a keratosis and perfectly safe Make sure you are taking your Pantoprazole twice daily to help w/ reflux/indigestion and nighttime cough Your EKG is completely unchanged from 2016- this is good news! I think the chest pain/tightness that you are feeling is acid reflux or indigestion- but if this worsens, please let me know so we can send you to see cardiology Call with any questions or concerns Happy Thanksgiving!!!!

## 2020-03-04 NOTE — Progress Notes (Signed)
Subjective:    Patient ID: [name], female    DOB: 01-12-1951, 69 y.o.   MRN: [id number]  HPI Spot on back- 'this thing has been there for a long time'.  Recently has enlarged in size.  Not painful.  Itchy around the area.  No bleeding.  'I want you to check my heart. i've been having a lot of pain in my chest'- sxs started 'several months ago'.  Pt states she wakes up feeling 'pretty ok' and then develops a dull ache.  'like I have indigestion.  Kidney doctor told her no tums.  No relief w/ drinking Coke to cause burping.  'if I get too tired it will start'.  Pain is substernal and radiates to back.  Some associated nausea.  Denies SOB.  No diaphoresis.  Pain is 'a little worse' w/ exertion- improves w/ rest but doesn't resolve.  Pt reports pain is worse w/ salad or greens.  Thinks she is taking Pantoprazole.  Wonders if this could be related to the cough she will get at night.   Review of Systems For ROS see HPI   This visit occurred during the SARS-CoV-2 public health emergency.  Safety protocols were in place, including screening questions prior to the visit, additional usage of staff PPE, and extensive cleaning of exam room while observing appropriate contact time as indicated for disinfecting solutions.       Objective:   Physical Exam Vitals reviewed.  Constitutional:      General: She is not in acute distress.    Appearance: Normal appearance. She is well-developed. She is obese. She is not ill-appearing.  HENT:     Head: Normocephalic and atraumatic.  Eyes:     Conjunctiva/sclera: Conjunctivae normal.     Pupils: Pupils are equal, round, and reactive to light.  Neck:     Thyroid: No thyromegaly.  Cardiovascular:     Rate and Rhythm: Normal rate and regular rhythm.     Heart sounds: Normal heart sounds. No murmur heard.   Pulmonary:     Effort: Pulmonary effort is normal. No respiratory distress.     Breath sounds: Normal breath sounds.  Chest:     Chest wall:  No tenderness.  Abdominal:     General: There is no distension.     Palpations: Abdomen is soft.     Tenderness: There is no abdominal tenderness.  Musculoskeletal:     Cervical back: Normal range of motion and neck supple.     Right lower leg: No edema.     Left lower leg: No edema.  Lymphadenopathy:     Cervical: No cervical adenopathy.  Skin:    General: Skin is warm and dry.     Comments: Oval, hyperpigmented keratosis on R lower back  Neurological:     Mental Status: She is alert and oriented to person, place, and time.  Psychiatric:        Behavior: Behavior normal.           Assessment & Plan:  Keratosis- pt informed of dx and we discussed that this is a benign skin lesion and there is no need for removal.  Pt expressed understanding and is in agreement w/ plan.   Chest pain- new.  Pt reports she has been having CP for 'months'.  Sxs do not worsen w/ exertion but do change w/ certain foods.  Her EKG is unchanged from 2016 and her sxs are more consistent w/ GERD- particularly given the  nighttime cough.  She thinks she is taking Protonix but can't be sure.  I instructed her that she needs to be taking this twice daily.  Reviewed supportive care and red flags that should prompt return.  Pt expressed understanding and is in agreement w/ plan.

## 2020-03-09 ENCOUNTER — Encounter: Payer: Self-pay | Admitting: Nurse Practitioner

## 2020-03-09 ENCOUNTER — Other Ambulatory Visit: Payer: Self-pay

## 2020-03-09 ENCOUNTER — Ambulatory Visit (INDEPENDENT_AMBULATORY_CARE_PROVIDER_SITE_OTHER): Payer: Medicare HMO | Admitting: Nurse Practitioner

## 2020-03-09 VITALS — BP 174/76 | HR 69

## 2020-03-09 DIAGNOSIS — E890 Postprocedural hypothyroidism: Secondary | ICD-10-CM

## 2020-03-09 DIAGNOSIS — E1159 Type 2 diabetes mellitus with other circulatory complications: Secondary | ICD-10-CM | POA: Diagnosis not present

## 2020-03-09 DIAGNOSIS — Z789 Other specified health status: Secondary | ICD-10-CM

## 2020-03-09 DIAGNOSIS — E042 Nontoxic multinodular goiter: Secondary | ICD-10-CM

## 2020-03-09 DIAGNOSIS — E559 Vitamin D deficiency, unspecified: Secondary | ICD-10-CM

## 2020-03-09 DIAGNOSIS — I10 Essential (primary) hypertension: Secondary | ICD-10-CM

## 2020-03-09 LAB — POCT GLYCOSYLATED HEMOGLOBIN (HGB A1C): HbA1c, POC (controlled diabetic range): 8.8 % — AB (ref 0.0–7.0)

## 2020-03-09 MED ORDER — FREESTYLE LIBRE 14 DAY SENSOR MISC
1.0000 | 2 refills | Status: DC
Start: 2020-03-09 — End: 2020-06-15

## 2020-03-09 MED ORDER — FREESTYLE LIBRE 14 DAY READER DEVI
1.0000 | Freq: Once | 0 refills | Status: AC
Start: 2020-03-09 — End: 2020-03-09

## 2020-03-09 NOTE — Progress Notes (Signed)
03/09/2020, 11:37 AM   Endocrinology follow-up note   Subjective:    Patient ID: [name], female    DOB: 1950/12/10.  [name] is being seen in follow-up for management of currently uncontrolled,complicated symptomatic type 2 diabetes and associated hypertension, hypothyroidism. PMD :  [name], MD.   Past Medical History:  Diagnosis Date  . Anginal pain ([place]) 03/19/2012   saw Dr. Cathie Olden .. she thinks its more related to stomach issues  . Arthritis    "all over; mostly in my back" (03/20/2012)  . Breast cancer ([place])    "right" (03/20/2012)  . Cardiomyopathy   . Chest pain 06/2005   Hospitalized, dystolic dysfunction  . Chronic lower back pain    "have 2 herniated discs; going to have to have a fusion" (03/20/2012)  . CKD (chronic kidney disease), stage III ([place])   . Family history of anesthesia complication    "father has nausea and vomiting"  . Gallstones   . GERD (gastroesophageal reflux disease)   . H/O hiatal hernia   . History of kidney stones   . History of right mastectomy   . Hypertension    "patient states never had HTN, takes Hyzaar for heart  . Hypothyroidism   . Irritable bowel syndrome   . Migraines    "it's been a long time since I've had one" (03/20/2012)  . Osteoarthritis   . Peripheral neuropathy   . PONV (postoperative nausea and vomiting)   . Sciatica   . Type II diabetes mellitus ([place])    Past Surgical History:  Procedure Laterality Date  . ABDOMINAL HYSTERECTOMY  1993  . adenosin cardiolite  07/2005   (+) wall motion abnormality  . AXILLARY LYMPH NODE DISSECTION  2003   squamous cell cancer  . BACK SURGERY    . CARDIAC CATHETERIZATION  ? 2005 / 2007  . CHOLECYSTECTOMY N/A 01/24/2018   Procedure: LAPAROSCOPIC CHOLECYSTECTOMY;  Surgeon: Erroll Luna, MD;  Location: [facility];  Service: General;  Laterality: N/A;  . CT abdomen and pelvis   02/2010   Same  . ESOPHAGOGASTRODUODENOSCOPY  2005   GERD  . KNEE ARTHROSCOPY  1990's   "right" (03/20/2012)  . LUMBAR FUSION N/A 08/22/2014   Procedure: Right L2-3 and right L1-2 transforaminal lumbar interbody fusion with pedicle screws, rods, sleeves, cages, local bone graft, Vivigen, cancellous chips;  Surgeon: Jessy Oto, MD;  Location: [facility];  Service: Orthopedics;  Laterality: N/A;  . LUMBAR LAMINECTOMY/DECOMPRESSION MICRODISCECTOMY N/A 02/04/2014   Procedure: RIGHT L2-3 MICRODISCECTOMY ;  Surgeon: Jessy Oto, MD;  Location: [facility];  Service: Orthopedics;  Laterality: N/A;  . MASTECTOMY Right   . MASTECTOMY WITH AXILLARY LYMPH NODE DISSECTION  12/2003   "right" (03/20/2012)  . POSTERIOR CERVICAL FUSION/FORAMINOTOMY  2001  . SHOULDER ARTHROSCOPY W/ ROTATOR CUFF REPAIR  2003   Left  . THYROIDECTOMY  1977   Right  . TOTAL KNEE ARTHROPLASTY  2000   Right  . [place] of abdomen  07/2005   Fatty liver / kidney stones   Social History   Socioeconomic History  . Marital status: Married    Spouse name: Not  on file  . Number of children: 1  . Years of education: Not on file  . Highest education level: Not on file  Occupational History  . Occupation: Retired since 2003  Tobacco Use  . Smoking status: Never Smoker  . Smokeless tobacco: Never Used  Vaping Use  . Vaping Use: Never used  Substance and Sexual Activity  . Alcohol use: No  . Drug use: No  . Sexual activity: Not on file  Other Topics Concern  . Not on file  Social History Narrative  . Not on file   Social Determinants of Health   Financial Resource Strain:   . Difficulty of Paying Living Expenses: Not on file  Food Insecurity: No Food Insecurity  . Worried About Charity fundraiser in the Last Year: Never true  . Ran Out of Food in the Last Year: Never true  Transportation Needs: No Transportation Needs  . Lack of Transportation (Medical): No  . Lack of Transportation (Non-Medical): No  Physical Activity:    . Days of Exercise per Week: Not on file  . Minutes of Exercise per Session: Not on file  Stress:   . Feeling of Stress : Not on file  Social Connections:   . Frequency of Communication with Friends and Family: Not on file  . Frequency of Social Gatherings with Friends and Family: Not on file  . Attends Religious Services: Not on file  . Active Member of Clubs or Organizations: Not on file  . Attends Archivist Meetings: Not on file  . Marital Status: Not on file   Outpatient Encounter Medications as of 03/09/2020  Medication Sig  . acetaminophen (TYLENOL) 500 MG tablet Take 1,000 mg by mouth every 6 (six) hours as needed for moderate pain or headache.  . Alcohol Swabs (B-D SINGLE USE SWABS REGULAR) PADS Use as directed.  [name] Kitchen amLODipine (NORVASC) 10 MG tablet TAKE 1 TABLET BY MOUTH EVERY DAY (Patient taking differently: Take 10 mg by mouth daily. )  . aspirin 81 MG tablet Take 81 mg by mouth daily.  . Blood Glucose Calibration (TRUE METRIX LEVEL 2) Normal SOLN Use as directed  . Blood Glucose Monitoring Suppl (TRUE METRIX AIR GLUCOSE METER) w/Device KIT 1 kit by Does not apply route 3 (three) times daily.  . carvedilol (COREG) 6.25 MG tablet Take 1 tablet (6.25 mg total) by mouth 2 (two) times daily with a meal.  . clotrimazole-betamethasone (LOTRISONE) cream   . Continuous Blood Gluc Transmit (DEXCOM G6 TRANSMITTER) MISC 1 Piece by Does not apply route as directed.  . cyclobenzaprine (FLEXERIL) 5 MG tablet Take 1 tablet (5 mg total) by mouth 2 (two) times daily as needed for muscle spasms.  . diclofenac Sodium (VOLTAREN) 1 % GEL Apply 4 g topically 4 (four) times daily as needed. (Patient taking differently: Apply 4 g topically 4 (four) times daily as needed (pain). )  . ferrous gluconate (FERGON) 324 MG tablet TAKE 1 TABLET (324 MG TOTAL) BY MOUTH 2 (TWO) TIMES DAILY WITH A MEAL.  . furosemide (LASIX) 40 MG tablet TAKE 1 TABLET (40 MG TOTAL) BY MOUTH DAILY AS NEEDED (LEG  SWELLING). (Patient taking differently: Take 40 mg by mouth daily as needed (leg swelling). Patient taking 75m daily)  . glipiZIDE (GLUCOTROL XL) 5 MG 24 hr tablet TAKE 1 TABLET BY MOUTH EVERY DAY WITH BREAKFAST  . glucose blood (TRUE METRIX BLOOD GLUCOSE TEST) test strip Use as instructed  . hydrALAZINE (APRESOLINE) 50 MG tablet Patient  taking 13m 3x/day  . HYDROcodone-acetaminophen (NORCO/VICODIN) 5-325 MG tablet Take 1 tablet by mouth every 6 (six) hours as needed for moderate pain.  .[name] Kitcheninsulin NPH-regular Human (70-30) 100 UNIT/ML injection Inject 40 Units into the skin See admin instructions. 40 units with breakfast and 40 units with supper for BG above 90  . levothyroxine (SYNTHROID) 100 MCG tablet Take 1 tablet (100 mcg total) by mouth daily.  .[name] Kitchenlosartan (COZAAR) 100 MG tablet   . losartan (COZAAR) 25 MG tablet   . metoCLOPramide (REGLAN) 10 MG tablet Take 1 tablet (10 mg total) by mouth 4 (four) times daily.  . ondansetron (ZOFRAN) 4 MG tablet Take 1 tablet (4 mg total) by mouth every 8 (eight) hours as needed for nausea or vomiting.  . pantoprazole (PROTONIX) 40 MG tablet Take 1 tablet (40 mg total) by mouth 2 (two) times daily. (Patient taking differently: Take 40 mg by mouth 2 (two) times daily as needed (acid reflux). )  . Polyethyl Glycol-Propyl Glycol (SYSTANE) 0.4-0.3 % SOLN Apply to eye. Taking 2x/day  . polyethylene glycol (MIRALAX / GLYCOLAX) packet Take 17 g by mouth daily as needed for moderate constipation.  . rosuvastatin (CRESTOR) 20 MG tablet Take 1 tablet (20 mg total) by mouth daily at 6 PM.  . traMADol (ULTRAM) 50 MG tablet TAKE 2 TABLETS (100 MG TOTAL) BY MOUTH EVERY 6 (SIX) HOURS AS NEEDED FOR UP TO 7 DAYS.  . TRUEplus Lancets 33G MISC Use as directed  . [DISCONTINUED] Continuous Blood Gluc Receiver (DEXCOM G6 RECEIVER) DEVI 1 Piece by Does not apply route as needed.  . [DISCONTINUED] Continuous Blood Gluc Sensor (DEXCOM G6 SENSOR) MISC 4 Pieces by Does not apply route  once a week.  . Continuous Blood Gluc Receiver (FREESTYLE LIBRE 14 DAY READER) DEVI 1 each by Does not apply route once for 1 dose.  . Continuous Blood Gluc Sensor (FREESTYLE LIBRE 14 DAY SENSOR) MISC Inject 1 each into the skin every 14 (fourteen) days. Use as directed.   No facility-administered encounter medications on file as of 03/09/2020.    ALLERGIES: Allergies  Allergen Reactions  . Dilaudid [Hydromorphone Hcl] Nausea And Vomiting  . Hydromorphone   . Lipitor [Atorvastatin] Other (See Comments)    Body & Muscle Aches  . Adhesive [Tape] Rash  . Ampicillin Rash and Other (See Comments)    REACTION: Rash and welts on her hands when she took it in the hospital  . Latex Itching, Dermatitis, Rash and Other (See Comments)    REACTION: Patient had a reaction to tape after surgery and they told her she was allergic to Latex   . Penicillins Rash and Other (See Comments)    Has patient had a PCN reaction causing immediate rash, facial/tongue/throat swelling, SOB or lightheadedness with hypotension: No Has patient had a PCN reaction causing severe rash involving mucus membranes or skin necrosis: No Has patient had a PCN reaction that required hospitalization: Yes - in hospital receiving treatment Has patient had a PCN reaction occurring within the last 10 years: No If all of the above answers are "NO", then may proceed with Cephalosporin use.     VACCINATION STATUS: Immunization History  Administered Date(s) Administered  . Fluad Quad(high Dose 65+) 12/13/2019  . Influenza Split 01/31/2011  . Influenza Whole 02/23/2007  . Influenza, High Dose Seasonal PF 01/10/2017  . Influenza,inj,Quad PF,6+ Mos 01/07/2014, 02/09/2016, 01/01/2018, 12/03/2018  . Influenza-Unspecified 01/10/2012  . PFIZER SARS-COV-2 Vaccination 05/24/2019, 06/18/2019  . Pneumococcal Conjugate-13 05/16/2019  .  Pneumococcal Polysaccharide-23 08/25/2014  . Tdap 04/20/2011    Diabetes She presents for her  follow-up diabetic visit. She has type 2 diabetes mellitus. Onset time: She was diagnosed at approximate age of 69 years. Her disease course has been worsening. There are no hypoglycemic associated symptoms. Pertinent negatives for hypoglycemia include no confusion, headaches, pallor or seizures. Associated symptoms include polydipsia, polyphagia and polyuria. Pertinent negatives for diabetes include no blurred vision, no chest pain, no fatigue and no foot ulcerations. There are no hypoglycemic complications. Symptoms are worsening. Diabetic complications include nephropathy and retinopathy. Risk factors for coronary artery disease include diabetes mellitus, hypertension, obesity, sedentary lifestyle, post-menopausal and dyslipidemia. Current diabetic treatment includes insulin injections and oral agent (monotherapy). She is compliant with treatment most of the time. She is following a generally unhealthy diet. When asked about meal planning, she reported none. She has not had a previous visit with a dietitian. She never participates in exercise. Her home blood glucose trend is increasing steadily. Her overall blood glucose range is 180-200 mg/dl. (She presents today with no meter or logs to review.  She reports she does not monitor her glucose routinely due to her her fingers being sore from monitoring and difficulty getting blood return for monitoring.  Her POCT A1c today is 8.8%, worsening from last visit of 6.9%.  She denies any s/s of hypoglycemia.  She admits to consuming large quantities of soda daily. ) An ACE inhibitor/angiotensin II receptor blocker is being taken. She does not see a podiatrist.Eye exam is current.  Hypertension This is a chronic problem. The current episode started more than 1 year ago. The problem has been gradually worsening since onset. The problem is uncontrolled. Pertinent negatives include no blurred vision, chest pain, headaches, palpitations or shortness of breath. Agents  associated with hypertension include thyroid hormones. Risk factors for coronary artery disease include diabetes mellitus, obesity, sedentary lifestyle, dyslipidemia, family history and post-menopausal state. Past treatments include angiotensin blockers, calcium channel blockers, beta blockers and diuretics. The current treatment provides mild improvement. There are no compliance problems.  Hypertensive end-organ damage includes kidney disease and retinopathy. Identifiable causes of hypertension include chronic renal disease and a thyroid problem.  Thyroid Problem Presents for follow-up visit. Onset time: She underwent what appears to be total thyroidectomy in her 90s for reportedly benign goiter.  Has taken thyroid hormone ever since, currently levothyroxine 100 mcg p.o. nightly. Patient reports no cold intolerance, diarrhea, fatigue, heat intolerance or palpitations. The symptoms have been stable. Past treatments include levothyroxine. Prior procedures include thyroidectomy.     Review of systems  Constitutional: + Minimally fluctuating body weight,  current There is no height or weight on file to calculate BMI. , no fatigue, no subjective hyperthermia, no subjective hypothermia Eyes: no blurry vision, no xerophthalmia ENT: no sore throat, no nodules palpated in throat, no dysphagia/odynophagia, no hoarseness Cardiovascular: no chest pain, no shortness of breath, no palpitations, no leg swelling Respiratory: no cough, no shortness of breath Gastrointestinal: no nausea/vomiting/diarrhea Musculoskeletal: no muscle/joint aches Skin: no rashes, no hyperemia Neurological: no tremors, no numbness, no tingling, no dizziness Psychiatric: no depression, no anxiety    Objective:    BP (!) 174/76 (BP Location: Left Arm)   Pulse 69   Wt Readings from Last 3 Encounters:  03/04/20 213 lb 3.2 oz (96.7 kg)  12/23/19 211 lb (95.7 kg)  12/13/19 209 lb 4 oz (94.9 kg)    BP Readings from Last 3  Encounters:  03/09/20 (!) 174/76  03/04/20 138/80  12/13/19 118/68     Physical Exam- Limited  Constitutional:  There is no height or weight on file to calculate BMI. , not in acute distress, normal state of mind Eyes:  EOMI, no exophthalmos Neck: Supple Thyroid: No gross goiter Cardiovascular: RRR, no murmers, rubs, or gallops, no edema Respiratory: Adequate breathing efforts, no crackles, rales, rhonchi, or wheezing Musculoskeletal: no gross deformities, strength intact in all four extremities, no gross restriction of joint movements Skin:  no rashes, no hyperemia Neurological: no tremor with outstretched hands    CMP Latest Ref Rng & Units 10/28/2019 10/19/2019 09/19/2019  Glucose 70 - 99 mg/dL 195(H) 109(H) 144(H)  BUN 6 - 23 mg/dL 34(H) 46(H) 36(H)  Creatinine 0.40 - 1.20 mg/dL 1.68(H) 2.25(H) 1.64(H)  Sodium 135 - 145 mEq/L 140 139 137  Potassium 3.5 - 5.1 mEq/L 4.9 5.1 4.9  Chloride 96 - 112 mEq/L 107 106 110  CO2 19 - 32 mEq/L 25 24 20(L)  Calcium 8.4 - 10.5 mg/dL 10.7(H) 10.0 10.6(H)  Total Protein 6.5 - 8.1 g/dL - - 7.3  Total Bilirubin 0.3 - 1.2 mg/dL - - 0.3  Alkaline Phos 38 - 126 U/L - - 65  AST 15 - 41 U/L - - 25  ALT 0 - 44 U/L - - 21     Diabetic Labs (most recent): Lab Results  Component Value Date   HGBA1C 8.8 (A) 03/09/2020   HGBA1C 6.9 (A) 12/05/2019   HGBA1C 8.4 (H) 08/05/2019     Lipid Panel ( most recent) Lipid Panel     Component Value Date/Time   CHOL 155 08/05/2019 1515   TRIG 269 (H) 08/05/2019 1515   HDL 41 (L) 08/05/2019 1515   CHOLHDL 3.8 08/05/2019 1515   VLDL 42 (H) 04/17/2019 0227   LDLCALC 79 08/05/2019 1515      Assessment & Plan:   1. DM type 2 causing vascular disease ([place])  - [name] has currently uncontrolled symptomatic type 2 DM since 69 years of age.  She presents today with no meter or logs to review.  She reports she does not monitor her glucose routinely due to her her fingers being sore from  monitoring and difficulty getting blood return for monitoring.  Her POCT A1c today is 8.8%, worsening from last visit of 6.9%.  She denies any s/s of hypoglycemia.  She admits to consuming large quantities of soda daily.  -her diabetes is complicated by retinopathy, obesity/sedentary life, and she remains at a high risk for more acute and chronic complications which include CAD, CVA, CKD, retinopathy, and neuropathy. These are all discussed in detail with her.  - Nutritional counseling repeated at each appointment due to patients tendency to fall back in to old habits.  - The patient admits there is a room for improvement in their diet and drink choices. -  Suggestion is made for the patient to avoid simple carbohydrates from their diet including Cakes, Sweet Desserts / Pastries, Ice Cream, Soda (diet and regular), Sweet Tea, Candies, Chips, Cookies, Sweet Pastries,  Store Bought Juices, Alcohol in Excess of  1-2 drinks a day, Artificial Sweeteners, Coffee Creamer, and "Sugar-free" Products. This will help patient to have stable blood glucose profile and potentially avoid unintended weight gain.   - I encouraged the patient to switch to  unprocessed or minimally processed complex starch and increased protein intake (animal or plant source), fruits, and vegetables.   - Patient is advised to stick to a routine  mealtimes to eat 3 meals  a day and avoid unnecessary snacks ( to snack only to correct hypoglycemia).  - she is following with Jearld Fenton, RDN, CDE for individualized diabetes education.  - I have approached her with the following individualized plan to manage diabetes and patient agrees:   -She will continue to benefit from her current simplified insulin treatment regimen.   -She is encouraged to stay engaged for proper monitoring and safe use of insulin (which she has struggled with in the past).  -Due to the lack of logs or meter to review, no changes will be made to her medication  regimen today.  She is advised to continue her Novolin 70/30 to 40 units with breakfast and 40 units with supper for pre-meal blood glucose readings above 90 and she is eating.  She is advised to continue Glipizide 5 mg XL daily with breakfast.  -She is encouraged to start monitoring blood glucose consistently 4 times per day, before meals and before bed and to call the clinic if she has readings less than 70 or greater than 200 for 3 tests in a row.  -She could greatly benefit from CGM.  She could not afford the copay for Dexcom.  Will send in Rx for Freestyle libre to [facility] in [place].  - she is warned not to take insulin without proper monitoring per orders.  -She is not a candidate for Metformin due to CKD.    - Patient specific target  A1c;  LDL, HDL, Triglycerides, were discussed in detail.  2) BP/HTN:  Her blood pressure is not controlled to target.  She is advised to continue Amlodipine 10 mg po daily, Carvedilol 6.25 mg po twice daily, Lasix 40 mg po daily as needed for swelling, Hydralazine 25 mg po TID, and Losartan 125 mg po daily. Will defer any med changes to nephrologist.  3) Lipids/HPL:    Her most recent lipid panel from 08/05/19 shows controlled LDL of 79 and elevated triglycerides of 269.  She has intolerance to statins.  4)  Weight/Diet:  Her There is no height or weight on file to calculate BMI.- clearly complicating her diabetes care.  She is a candidate for modest weight loss.  I discussed with her the fact that loss of 5 - 10% of her  current body weight will have the most impact on her diabetes management.  CDE Consult will be initiated . Exercise, and detailed carbohydrates information provided  -  detailed on discharge instructions.  5) Hypothyroidism-postsurgical There are no recent TFTs to review.  She is advised to continue Levothyroxine 100 mcg po daily before breakfast.  Will recheck TFTs prior to next visit and adjust dose if appropriate.   - We  discussed about the correct intake of her thyroid hormone, on empty stomach at fasting, with water, separated by at least 30 minutes from breakfast and other medications,  and separated by more than 4 hours from calcium, iron, multivitamins, acid reflux medications (PPIs). -Patient is made aware of the fact that thyroid hormone replacement is needed for life, dose to be adjusted by periodic monitoring of thyroid function tests.  6) Multinodular goiter. -Her most recent thyroid/neck ultrasound revealed 2.9 cm nodule on the left lobe of her thyroid -She has surgically absent right thyroid lobe.  Biopsy of 2.9 cm nodule on the left lobe is benign.    She will not need any surgical intervention at this time.    7) Vitamin D deficiency -Her  most recent vitamin D level on 10/02/18 was 17.  She was taken off her vitamin D supplement by her nephrologist.   8) Chronic Care/Health Maintenance: -she is on ARB and is encouraged to initiate and continue to follow up with Ophthalmology, Dentist,  Podiatrist at least yearly or according to recommendations, and advised to  stay away from smoking. I have recommended yearly flu vaccine and pneumonia vaccine at least every 5 years; moderate intensity exercise for up to 150 minutes weekly; and  sleep for at least 7 hours a day.  - I advised patient to maintain close follow up with [name], MD for primary care needs.  - Time spent on this patient care encounter:  35 min, of which > 50% was spent in  counseling and the rest reviewing her blood glucose logs , discussing her hypoglycemia and hyperglycemia episodes, reviewing her current and  previous labs / studies  ( including abstraction from other facilities) and medications  doses and developing a  long term treatment plan and documenting her care.   Please refer to Patient Instructions for Blood Glucose Monitoring and Insulin/Medications Dosing Guide"  in media tab for additional information. Please  also  refer to " Patient Self Inventory" in the Media  tab for reviewed elements of pertinent patient history.  [name] participated in the discussions, expressed understanding, and voiced agreement with the above plans.  All questions were answered to her satisfaction. she is encouraged to contact clinic should she have any questions or concerns prior to her return visit.   Follow up plan: - Return in about 3 months (around 06/08/2020) for Diabetes follow up with A1c in office, Previsit labs, Bring glucometer and logs.  [name], [facility] [address] [place], [place] [address] Phone: [phone] Fax: [phone]  03/09/2020, 11:37 AM

## 2020-03-09 NOTE — Patient Instructions (Signed)

## 2020-03-25 ENCOUNTER — Other Ambulatory Visit: Payer: Self-pay

## 2020-03-25 ENCOUNTER — Ambulatory Visit
Admission: RE | Admit: 2020-03-25 | Discharge: 2020-03-25 | Disposition: A | Discharge status: Home / Self Care | Payer: Medicare HMO | Source: Ambulatory Visit | Attending: Specialist | Admitting: Specialist

## 2020-03-25 DIAGNOSIS — Z780 Asymptomatic menopausal state: Secondary | ICD-10-CM | POA: Diagnosis not present

## 2020-03-25 DIAGNOSIS — M85852 Other specified disorders of bone density and structure, left thigh: Secondary | ICD-10-CM | POA: Diagnosis not present

## 2020-03-25 DIAGNOSIS — M8000XA Age-related osteoporosis with current pathological fracture, unspecified site, initial encounter for fracture: Secondary | ICD-10-CM

## 2020-03-25 DIAGNOSIS — T84498A Other mechanical complication of other internal orthopedic devices, implants and grafts, initial encounter: Secondary | ICD-10-CM

## 2020-03-25 DIAGNOSIS — M4005 Postural kyphosis, thoracolumbar region: Secondary | ICD-10-CM

## 2020-03-26 DIAGNOSIS — I272 Pulmonary hypertension, unspecified: Secondary | ICD-10-CM | POA: Diagnosis not present

## 2020-03-26 DIAGNOSIS — R803 Bence Jones proteinuria: Secondary | ICD-10-CM | POA: Diagnosis not present

## 2020-03-26 DIAGNOSIS — N179 Acute kidney failure, unspecified: Secondary | ICD-10-CM | POA: Diagnosis not present

## 2020-03-26 DIAGNOSIS — I5032 Chronic diastolic (congestive) heart failure: Secondary | ICD-10-CM | POA: Diagnosis not present

## 2020-03-26 DIAGNOSIS — N1832 Chronic kidney disease, stage 3b: Secondary | ICD-10-CM | POA: Diagnosis not present

## 2020-03-26 DIAGNOSIS — I700 Atherosclerosis of aorta: Secondary | ICD-10-CM | POA: Diagnosis not present

## 2020-03-26 DIAGNOSIS — E1122 Type 2 diabetes mellitus with diabetic chronic kidney disease: Secondary | ICD-10-CM | POA: Diagnosis not present

## 2020-03-26 DIAGNOSIS — I129 Hypertensive chronic kidney disease with stage 1 through stage 4 chronic kidney disease, or unspecified chronic kidney disease: Secondary | ICD-10-CM | POA: Diagnosis not present

## 2020-04-14 DIAGNOSIS — N1832 Chronic kidney disease, stage 3b: Secondary | ICD-10-CM | POA: Diagnosis not present

## 2020-04-16 ENCOUNTER — Encounter: Payer: Self-pay | Admitting: Family Medicine

## 2020-04-16 ENCOUNTER — Telehealth (INDEPENDENT_AMBULATORY_CARE_PROVIDER_SITE_OTHER): Payer: Medicare HMO | Admitting: Family Medicine

## 2020-04-16 DIAGNOSIS — M5137 Other intervertebral disc degeneration, lumbosacral region: Secondary | ICD-10-CM | POA: Diagnosis not present

## 2020-04-16 DIAGNOSIS — M48062 Spinal stenosis, lumbar region with neurogenic claudication: Secondary | ICD-10-CM | POA: Diagnosis not present

## 2020-04-16 DIAGNOSIS — M4316 Spondylolisthesis, lumbar region: Secondary | ICD-10-CM | POA: Diagnosis not present

## 2020-04-16 NOTE — Progress Notes (Signed)
I connected with  [name] on 04/16/20 by a video enabled telemedicine application and verified that I am speaking with the correct person using two identifiers.   I discussed the limitations of evaluation and management by telemedicine. The patient expressed understanding and agreed to proceed.

## 2020-04-16 NOTE — Progress Notes (Signed)
Virtual Visit via Video   I connected with patient on 04/16/20 at  7:30 AM EST by a video enabled telemedicine application and verified that I am speaking with the correct person using two identifiers.  Location patient: Home Location provider: [facility] Persons participating in the virtual visit: Patient, Provider, [place] (Sabrina M)  I discussed the limitations of evaluation and management by telemedicine and the availability of in person appointments. The patient expressed understanding and agreed to proceed.  Subjective:   HPI:   Back pain- 'almost 2 yrs now'.  'it feels like it's going to break- right in the middle'.  Had been using cane but on Monday had to use walker b/c she wasn't able to put any weight on R foot.  If she did bear weight, 'it hurt all the way up to my hip'.  Has been seeing Dr Louanne Skye for back.  Saw Dr Rush Farmer for hip.  'pain pills don't help.  Tylenol don't help'.  Dr Rush Farmer said 'hip was fine'.  'there's nothing happening'.  Pt reports she has had 2 back surgeries that haven't helped.  There is discussion about removing malaligned hardware.  No relief w/ Tramadol- 'all it does is constipate me'.  Previous attempts at PT have been unsuccessful.  ROS:   See pertinent positives and negatives per HPI.  Patient Active Problem List   Diagnosis Date Noted  . Hyperparathyroidism ([place]) 12/13/2019  . Elevated hemoglobin ([place]) 12/13/2019  . Chronic renal impairment, stage 4 (severe) ([place]) 10/28/2019  . Anemia 10/28/2019  . Physical exam 06/26/2019  . Neck pain 04/18/2019  . Ascending aortic aneurysm ([place]) 04/17/2019  . Hypoxia   . Nonspecific chest pain 04/15/2019  . Neuropathic pain of right flank 12/27/2018  . Dysphagia 12/27/2018  . Postsurgical hypothyroidism 06/11/2018  . Vitamin D deficiency 06/11/2018  . Multinodular goiter 06/11/2018  . Hypercalcemia 06/11/2018  . DM type 2 causing vascular disease ([place])   . PONV (postoperative nausea  and vomiting)   . Peripheral neuropathy   . Irritable bowel syndrome   . Hypertension   . History of right mastectomy   . H/O hiatal hernia   . GERD (gastroesophageal reflux disease)   . Family history of anesthesia complication   . Diabetes mellitus type 2, uncontrolled, with complications ([place])   . Lower extremity edema   . Chronic constipation 06/23/2015  . Morbid obesity ([place]) 06/12/2015  . Chronic diastolic heart failure ([place]) 11/17/2014  . Herniated nucleus pulposus, L2-3 right 02/04/2014    Class: Acute  . Spondylolisthesis of lumbar region 04/30/2012    Class: Chronic  . Lumbar stenosis with neurogenic claudication 04/30/2012    Class: Chronic  . SYNCOPE-CAROTID SINUS 09/16/2008  . OSTEOARTHRITIS 08/23/2006  . DDD (degenerative disc disease), cervical 08/23/2006  . [place] DISEASE, LUMBAR 08/23/2006  . Headache(784.0) 08/23/2006  . BREAST CANCER, HX OF 08/23/2006  . HYPERPARATHYROIDISM, HX OF 08/23/2006  . RENAL CALCULUS, HX OF 08/23/2006    Social History   Tobacco Use  . Smoking status: Never Smoker  . Smokeless tobacco: Never Used  Substance Use Topics  . Alcohol use: No    Current Outpatient Medications:  .  acetaminophen (TYLENOL) 500 MG tablet, Take 1,000 mg by mouth every 6 (six) hours as needed for moderate pain or headache., Disp: , Rfl:  .  Alcohol Swabs (B-D SINGLE USE SWABS REGULAR) PADS, Use as directed., Disp: 100 each, Rfl: 1 .  amLODipine (NORVASC) 10 MG tablet, TAKE 1 TABLET BY  MOUTH EVERY DAY (Patient taking differently: Take 10 mg by mouth daily.), Disp: 90 tablet, Rfl: 3 .  aspirin 81 MG tablet, Take 81 mg by mouth daily., Disp: , Rfl:  .  Blood Glucose Calibration (TRUE METRIX LEVEL 2) Normal SOLN, Use as directed, Disp: 3 each, Rfl: 3 .  Blood Glucose Monitoring Suppl (TRUE METRIX AIR GLUCOSE METER) w/Device KIT, 1 kit by Does not apply route 3 (three) times daily., Disp: 1 kit, Rfl: 0 .  carvedilol (COREG) 6.25 MG tablet, Take 1 tablet (6.25 mg  total) by mouth 2 (two) times daily with a meal., Disp: 180 tablet, Rfl: 1 .  clotrimazole-betamethasone (LOTRISONE) cream, , Disp: , Rfl:  .  Continuous Blood Gluc Sensor (FREESTYLE LIBRE 14 DAY SENSOR) MISC, Inject 1 each into the skin every 14 (fourteen) days. Use as directed., Disp: 2 each, Rfl: 2 .  Continuous Blood Gluc Transmit (DEXCOM G6 TRANSMITTER) MISC, 1 Piece by Does not apply route as directed., Disp: 1 each, Rfl: 1 .  cyclobenzaprine (FLEXERIL) 5 MG tablet, Take 1 tablet (5 mg total) by mouth 2 (two) times daily as needed for muscle spasms., Disp: 15 tablet, Rfl: 0 .  diclofenac Sodium (VOLTAREN) 1 % GEL, Apply 4 g topically 4 (four) times daily as needed. (Patient taking differently: Apply 4 g topically 4 (four) times daily as needed (pain).), Disp: 100 g, Rfl: 0 .  ferrous gluconate (FERGON) 324 MG tablet, TAKE 1 TABLET (324 MG TOTAL) BY MOUTH 2 (TWO) TIMES DAILY WITH A MEAL., Disp: 180 tablet, Rfl: 2 .  furosemide (LASIX) 40 MG tablet, TAKE 1 TABLET (40 MG TOTAL) BY MOUTH DAILY AS NEEDED (LEG SWELLING). (Patient taking differently: Take 40 mg by mouth daily as needed (leg swelling). Patient taking 87m daily), Disp: 90 tablet, Rfl: 1 .  glipiZIDE (GLUCOTROL XL) 5 MG 24 hr tablet, TAKE 1 TABLET BY MOUTH EVERY DAY WITH BREAKFAST, Disp: 90 tablet, Rfl: 0 .  glucose blood (TRUE METRIX BLOOD GLUCOSE TEST) test strip, Use as instructed, Disp: 100 each, Rfl: 12 .  hydrALAZINE (APRESOLINE) 50 MG tablet, Patient taking 261m3x/day, Disp: , Rfl:  .  HYDROcodone-acetaminophen (NORCO/VICODIN) 5-325 MG tablet, Take 1 tablet by mouth every 6 (six) hours as needed for moderate pain., Disp: 30 tablet, Rfl: 0 .  insulin NPH-regular Human (70-30) 100 UNIT/ML injection, Inject 40 Units into the skin See admin instructions. 40 units with breakfast and 40 units with supper for BG above 90, Disp: , Rfl:  .  levothyroxine (SYNTHROID) 100 MCG tablet, Take 1 tablet (100 mcg total) by mouth daily., Disp: 90  tablet, Rfl: 1 .  losartan (COZAAR) 100 MG tablet, , Disp: , Rfl:  .  losartan (COZAAR) 25 MG tablet, , Disp: , Rfl:  .  metoCLOPramide (REGLAN) 10 MG tablet, Take 1 tablet (10 mg total) by mouth 4 (four) times daily., Disp: 120 tablet, Rfl: 1 .  ondansetron (ZOFRAN) 4 MG tablet, Take 1 tablet (4 mg total) by mouth every 8 (eight) hours as needed for nausea or vomiting., Disp: 30 tablet, Rfl: 1 .  pantoprazole (PROTONIX) 40 MG tablet, Take 1 tablet (40 mg total) by mouth 2 (two) times daily. (Patient taking differently: Take 40 mg by mouth 2 (two) times daily as needed (acid reflux).), Disp: 180 tablet, Rfl: 1 .  Polyethyl Glycol-Propyl Glycol (SYSTANE) 0.4-0.3 % SOLN, Apply to eye. Taking 2x/day, Disp: , Rfl:  .  polyethylene glycol (MIRALAX / GLYCOLAX) packet, Take 17 g by mouth daily  as needed for moderate constipation., Disp: , Rfl:  .  rosuvastatin (CRESTOR) 20 MG tablet, Take 1 tablet (20 mg total) by mouth daily at 6 PM., Disp: 90 tablet, Rfl: 1 .  traMADol (ULTRAM) 50 MG tablet, TAKE 2 TABLETS (100 MG TOTAL) BY MOUTH EVERY 6 (SIX) HOURS AS NEEDED FOR UP TO 7 DAYS., Disp: 40 tablet, Rfl: 0 .  TRUEplus Lancets 33G MISC, Use as directed, Disp: 300 each, Rfl: 3  Allergies  Allergen Reactions  . Dilaudid [Hydromorphone Hcl] Nausea And Vomiting  . Hydromorphone   . Lipitor [Atorvastatin] Other (See Comments)    Body & Muscle Aches  . Adhesive [Tape] Rash  . Ampicillin Rash and Other (See Comments)    REACTION: Rash and welts on her hands when she took it in the hospital  . Latex Itching, Dermatitis, Rash and Other (See Comments)    REACTION: Patient had a reaction to tape after surgery and they told her she was allergic to Latex   . Penicillins Rash and Other (See Comments)    Has patient had a PCN reaction causing immediate rash, facial/tongue/throat swelling, SOB or lightheadedness with hypotension: No Has patient had a PCN reaction causing severe rash involving mucus membranes or skin  necrosis: No Has patient had a PCN reaction that required hospitalization: Yes - in hospital receiving treatment Has patient had a PCN reaction occurring within the last 10 years: No If all of the above answers are "NO", then may proceed with Cephalosporin use.     Objective:   There were no vitals taken for this visit.  AAOx3, NAD NCAT, EOMI No obvious CN deficits Pt is able to speak clearly, coherently without shortness of breath or increased work of breathing.  Thought process is linear.  Mood is appropriate.   Assessment and Plan:   Lumbar DDD/stenosis/spondylolisthesis- chronic problem for pt.  She has already had 2 back surgeries that she said were not helpful.  Reports Dr Louanne Skye has been hesitant to move forward but she is not able to tolerate things the way they are.  Unable to walk, unable to do 'housework'- which is very upsetting to her.  She states opiates are ineffective and just lead to constipation.  Will refer her for a 2nd opinion [facility].  Pt expressed understanding and is in agreement w/ plan.    Annye Asa, MD 04/16/2020

## 2020-04-17 IMAGING — CT CT ABD-PELV W/ CM
2 of 5 series · 16 of 46 positions shown, 18 images · IV contrast (APPLIED)
Comparison: CT [DATE], lumbar CT 10/21/2014

CLINICAL DATA: Abdominal pain with nausea

EXAM:
CT ABDOMEN AND PELVIS WITH CONTRAST
TECHNIQUE: Multidetector CT imaging of the abdomen and pelvis was performed
using the standard protocol following bolus administration of
intravenous contrast.
CONTRAST:  100mL [id number] IOPAMIDOL ([id number]) INJECTION 61%

[Series 2: axial (person_name) (person_name) · axial · 0.77mm/px · z∈[-586,-160]mm · 13 of 97 slices shown, 15 images]
[im 6/97  soft-tissue]
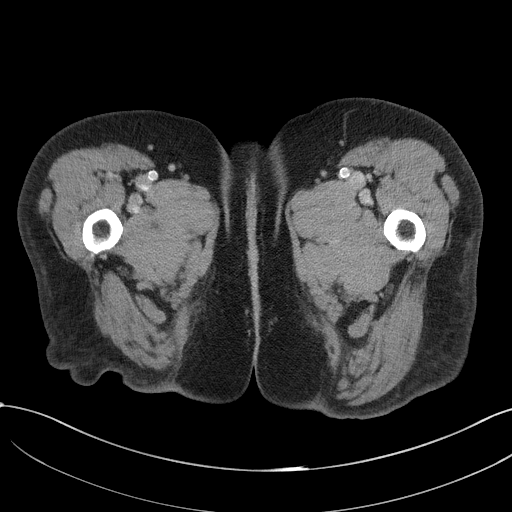
[im 6/97  bone]
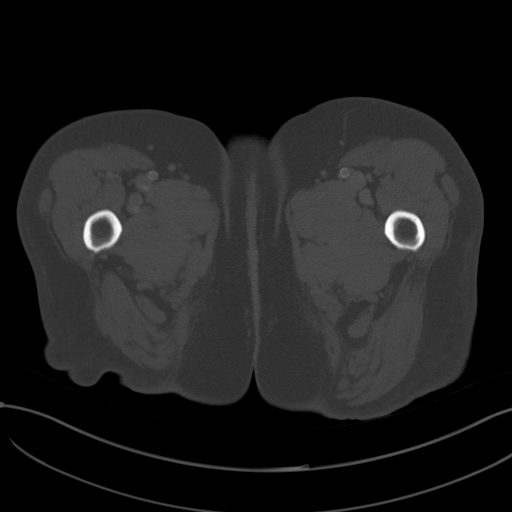
[im 11/97  soft-tissue]
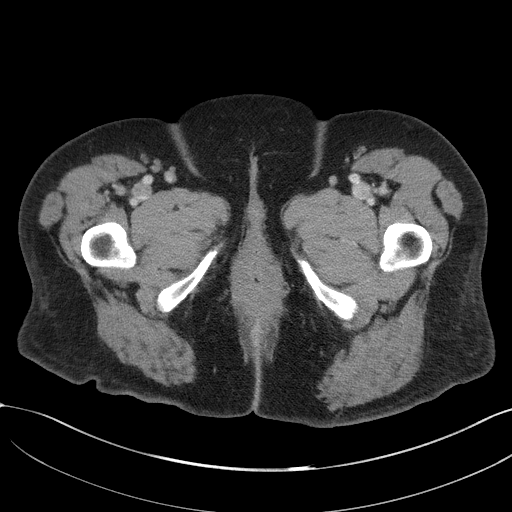
[im 22/97  soft-tissue]
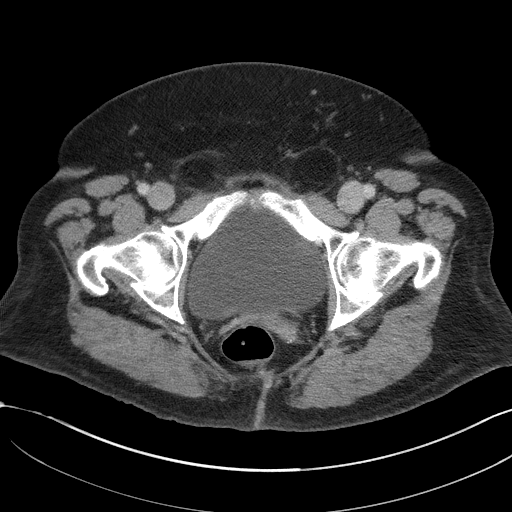
[im 27/97  soft-tissue]
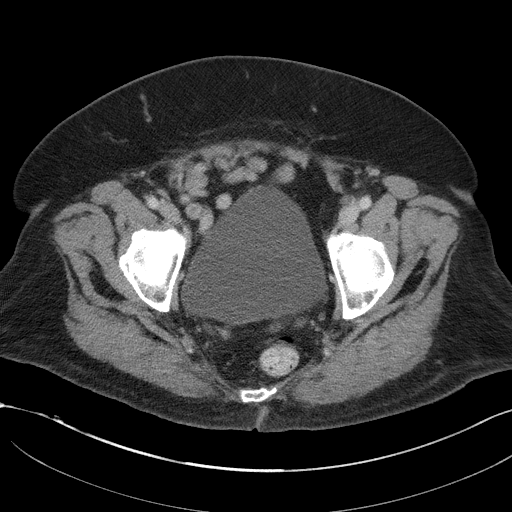
[im 33/97  soft-tissue]
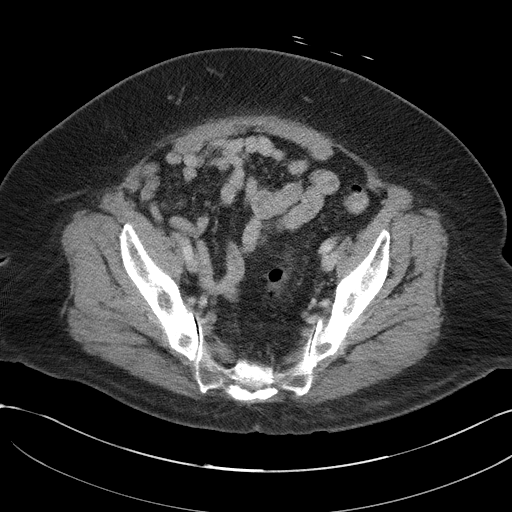
[im 43/97  soft-tissue]
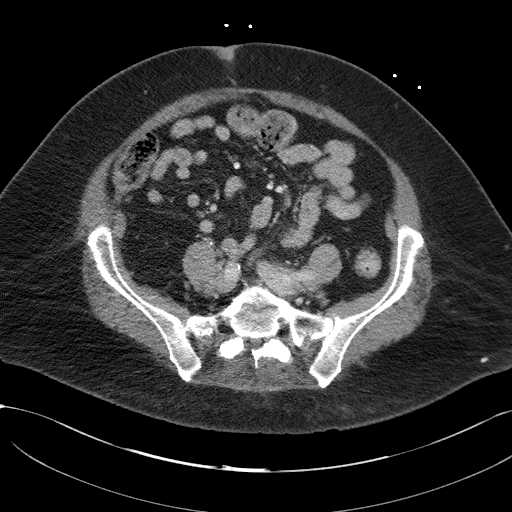
[im 49/97  soft-tissue]
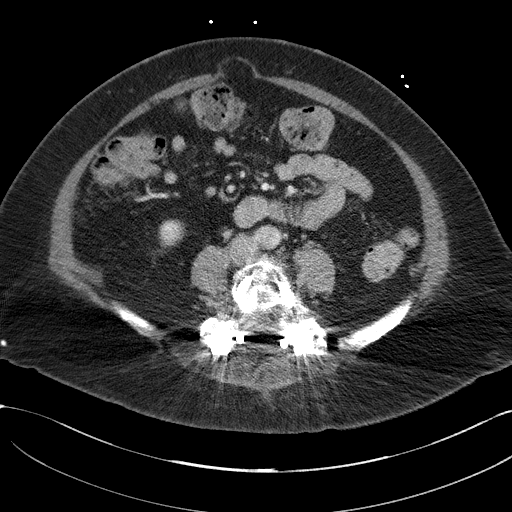
[im 54/97  soft-tissue]
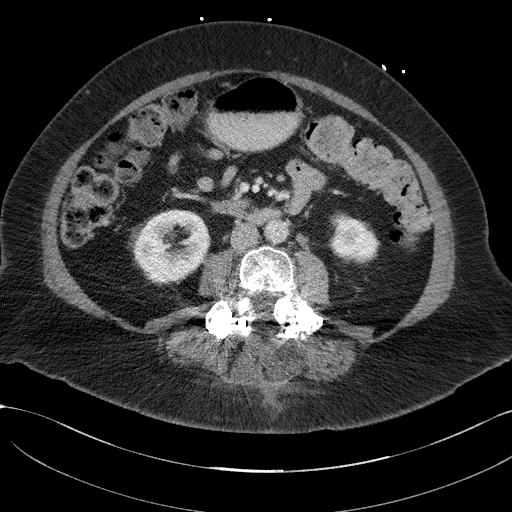
[im 65/97  soft-tissue]
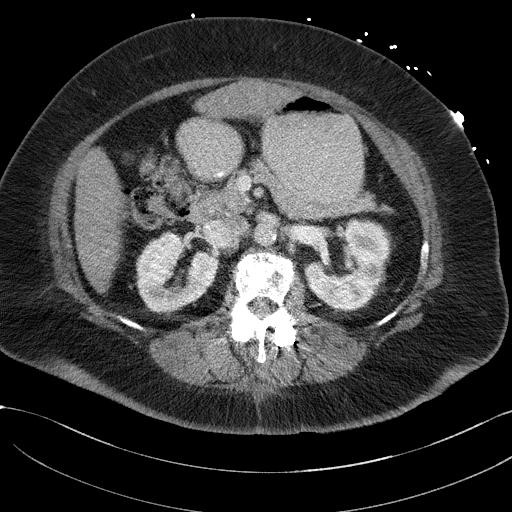
[im 65/97  bone]
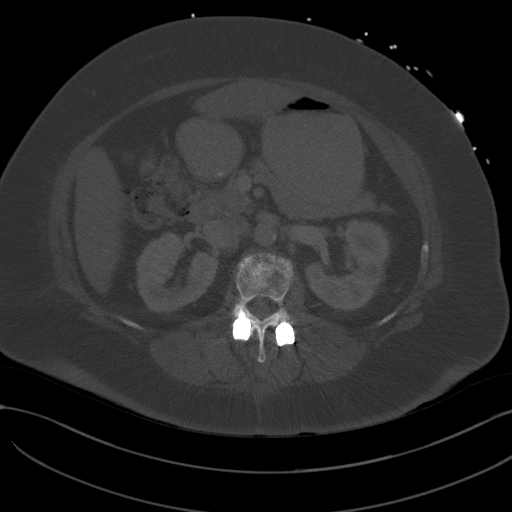
[im 70/97  soft-tissue]
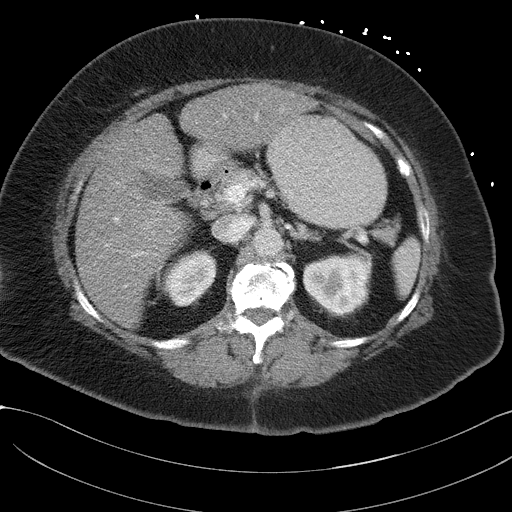
[im 75/97  soft-tissue]
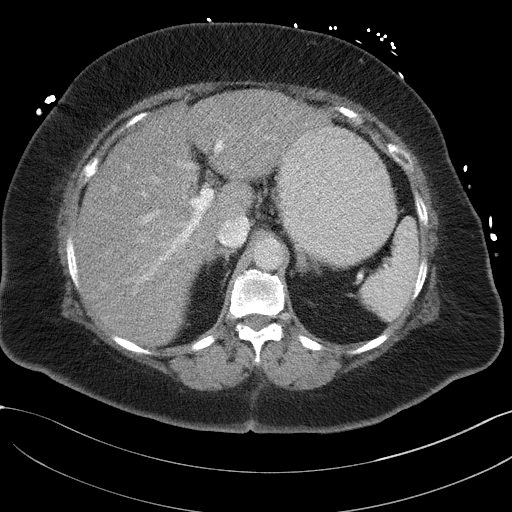
[im 86/97  soft-tissue]
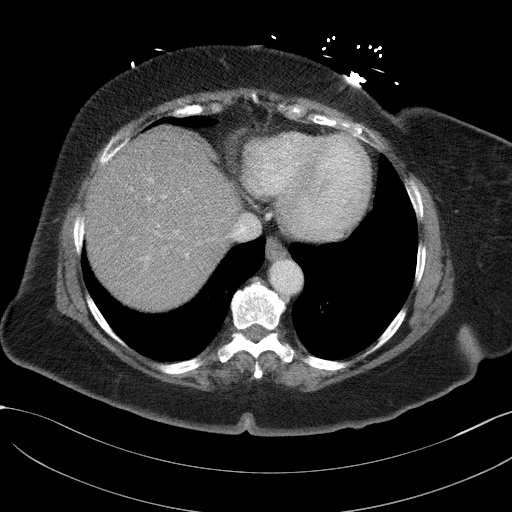
[im 91/97  soft-tissue]
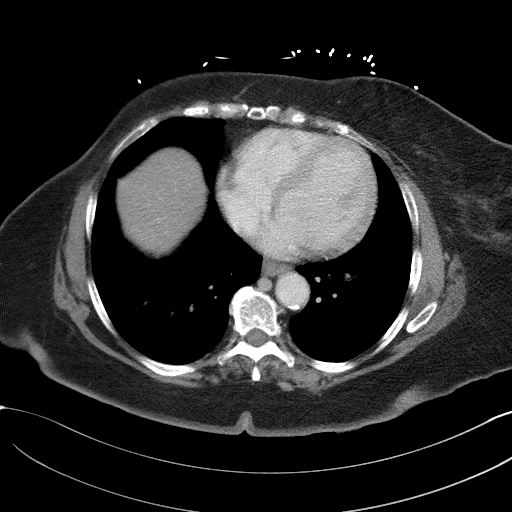

[Series 5: coronal st · coronal · 0.81mm/px · 3 of 110 slices shown]
[im 37/110  soft-tissue]
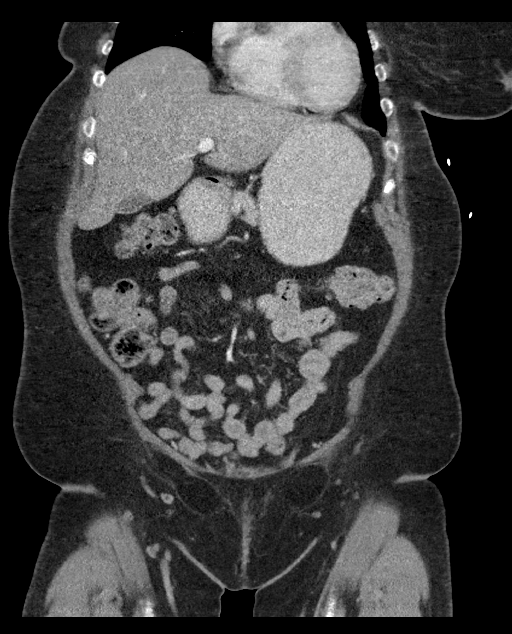
[im 49/110  soft-tissue]
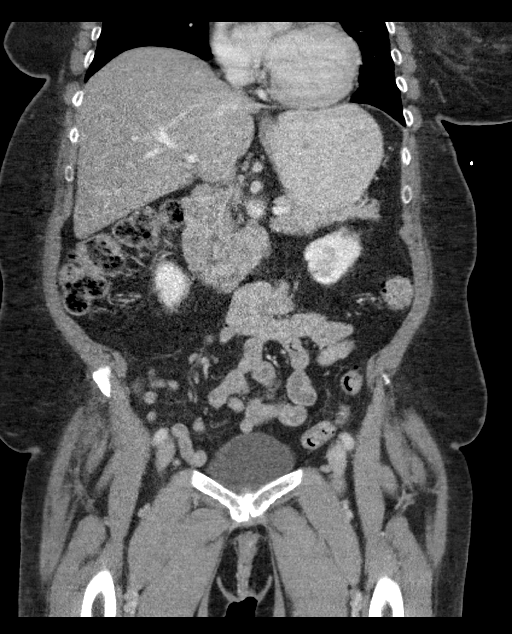
[im 61/110  soft-tissue]
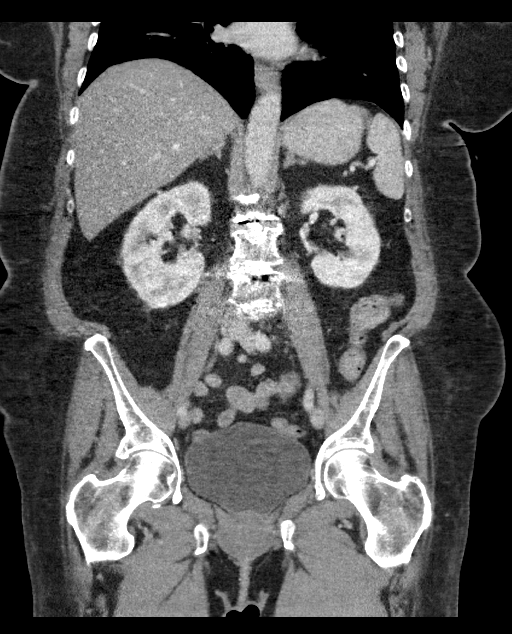

[16 of 46 positions shown; findings below may reference images not displayed]

FINDINGS: Lower chest: Lung bases demonstrate no acute consolidation or
effusion. The heart size is within normal limits. Partially
visualized right mastectomy changes

Hepatobiliary: Steatosis. Small amount of layering stone blood or
punctate stones in the gallbladder. No biliary dilatation.

Pancreas: Unremarkable. No pancreatic ductal dilatation or
surrounding inflammatory changes.

Spleen: Normal in size without focal abnormality.

Adrenals/Urinary Tract: Adrenal glands are normal. No
hydronephrosis. 6 mm calcification mid left kidney with adjacent
cortical thinning and scarring. 3 mm stone mid right kidney.
Parapelvic cysts in the lower left kidney. Bladder is normal

Stomach/Bowel: Stomach is nonenlarged. No dilated small bowel.
Negative appendix. No colon wall thickening.

Vascular/Lymphatic: Aortic atherosclerosis, mild. No aneurysm. No
significantly enlarged lymph nodes

Reproductive: Status post hysterectomy. No adnexal masses.

Other: No free air or free fluid.  Small fat in the umbilical region

Musculoskeletal: Transitional anatomy of the lumbar spine. Posterior
stabilization rods and fixating screws L2 through S1 assuming S1
transitional segment. Superior endplate breach of the fixating
screws at L2 with chronic superior endplate deformity.
IMPRESSION: 1. No CT evidence for acute intra-abdominal or pelvic abnormality.
2. Steatosis
3. Punctate stones or small amount of layering sludge in the
gallbladder
4. Intrarenal calculi bilaterally

## 2020-04-17 IMAGING — DX DG CHEST 2V
2 series · 2 of 2 positions shown · non-contrast
Comparison: CXR 03/04/2017

CLINICAL DATA: Central to left-sided chest pain

EXAM:
CHEST - 2 VIEW

[chest pa]
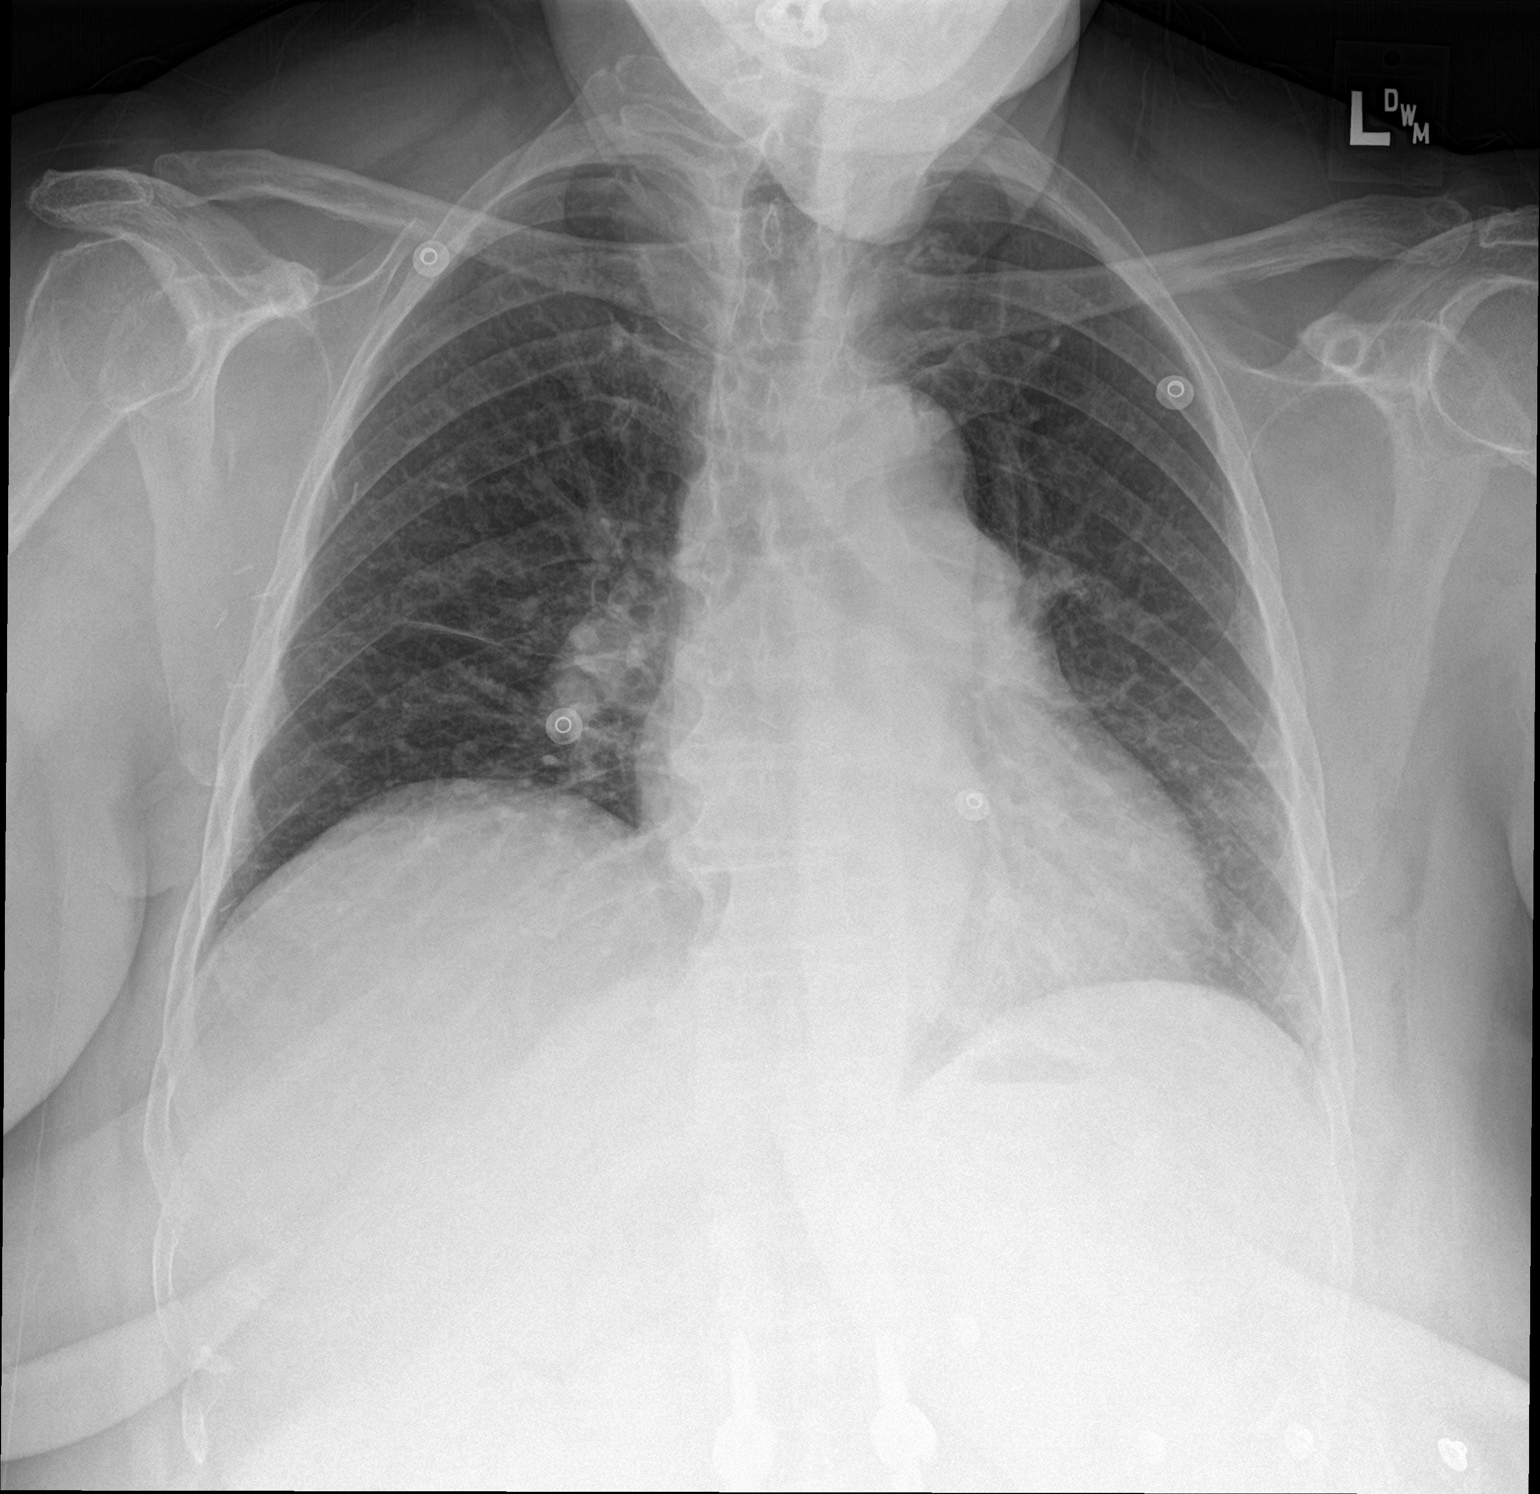

[chest lat]
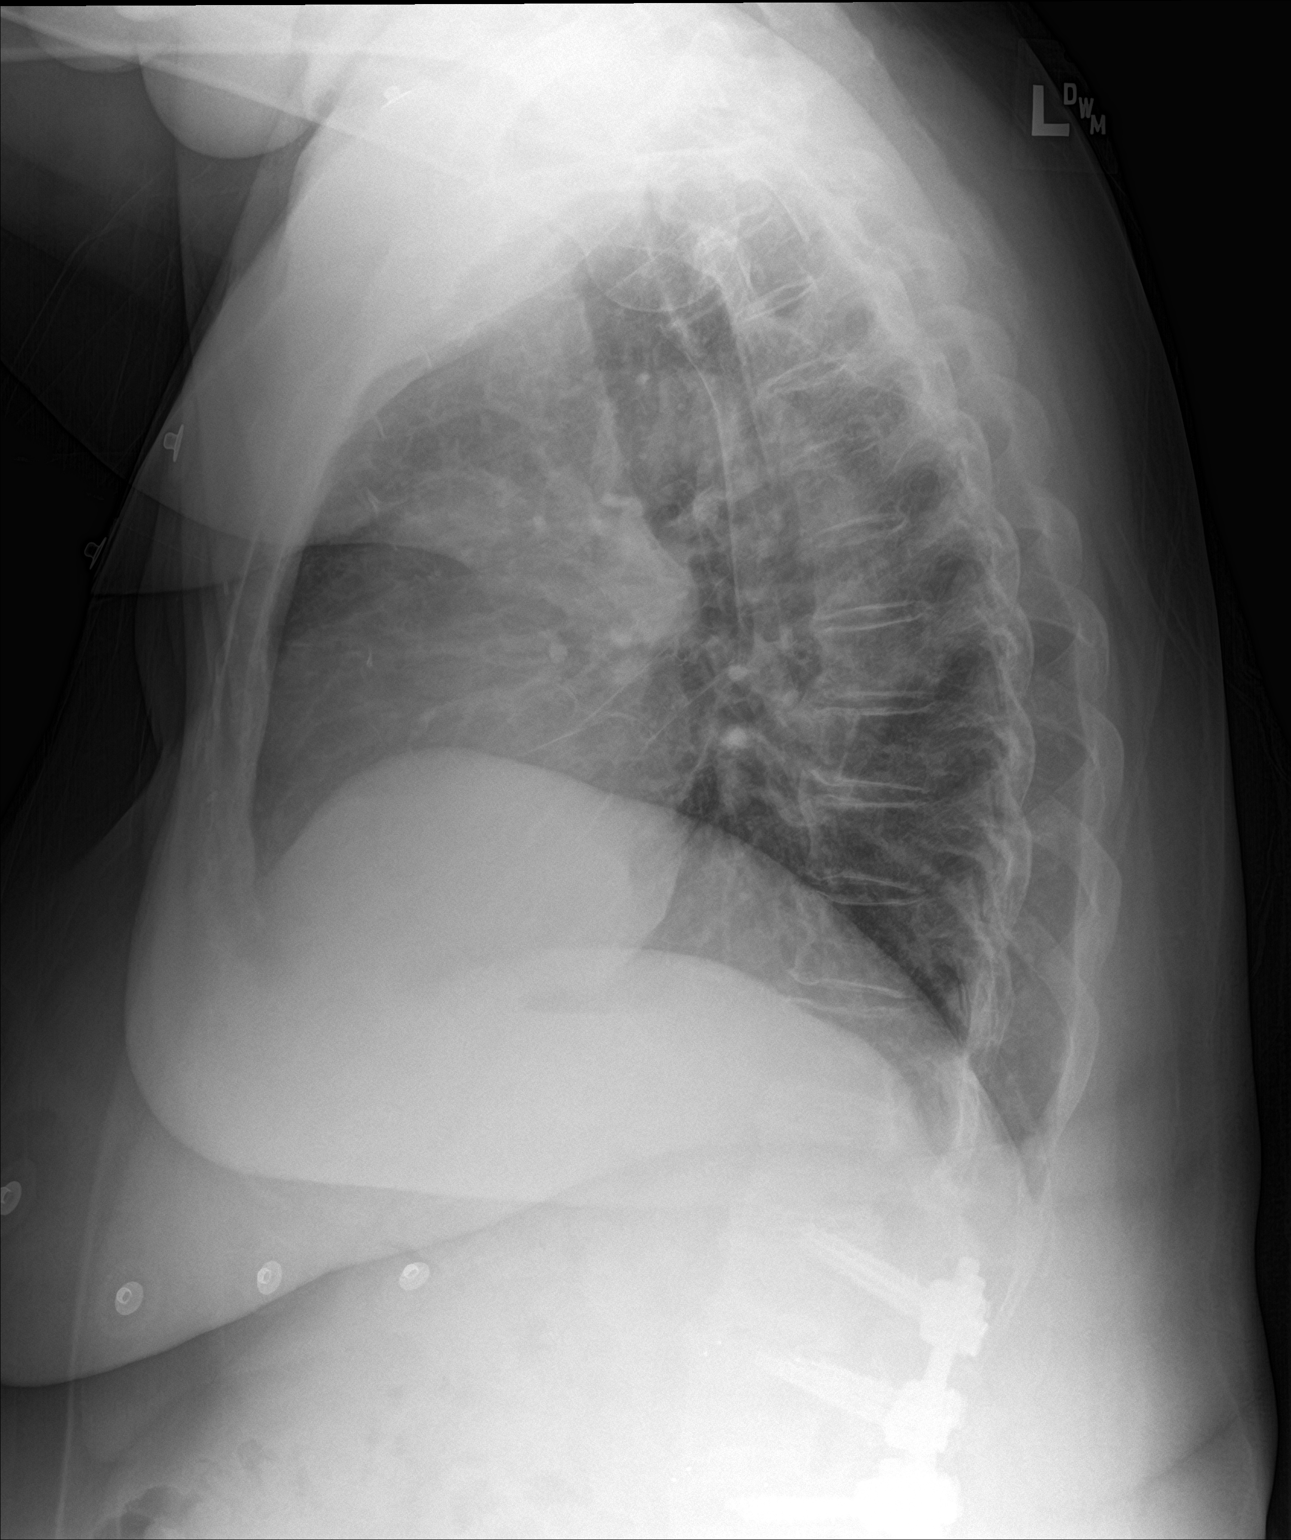

[2 of 2 positions shown; findings below may reference images not displayed]

FINDINGS: The heart size and mediastinal contours are within normal limits.
Both lungs are clear. Surgical clips project over the right axilla
and chest wall. The visualized skeletal structures are unremarkable.
IMPRESSION: No active cardiopulmonary disease.

## 2020-04-20 ENCOUNTER — Encounter: Payer: Self-pay | Admitting: Emergency Medicine

## 2020-04-20 DIAGNOSIS — E113393 Type 2 diabetes mellitus with moderate nonproliferative diabetic retinopathy without macular edema, bilateral: Secondary | ICD-10-CM | POA: Diagnosis not present

## 2020-04-20 DIAGNOSIS — Z961 Presence of intraocular lens: Secondary | ICD-10-CM | POA: Diagnosis not present

## 2020-04-20 DIAGNOSIS — H16223 Keratoconjunctivitis sicca, not specified as Sjogren's, bilateral: Secondary | ICD-10-CM | POA: Diagnosis not present

## 2020-04-20 DIAGNOSIS — H35033 Hypertensive retinopathy, bilateral: Secondary | ICD-10-CM | POA: Diagnosis not present

## 2020-04-20 LAB — HM DIABETES EYE EXAM

## 2020-04-21 ENCOUNTER — Other Ambulatory Visit: Payer: Self-pay

## 2020-04-21 DIAGNOSIS — M960 Pseudarthrosis after fusion or arthrodesis: Secondary | ICD-10-CM | POA: Diagnosis not present

## 2020-04-21 DIAGNOSIS — M5416 Radiculopathy, lumbar region: Secondary | ICD-10-CM | POA: Diagnosis not present

## 2020-04-21 DIAGNOSIS — M4326 Fusion of spine, lumbar region: Secondary | ICD-10-CM | POA: Diagnosis not present

## 2020-04-21 DIAGNOSIS — M545 Low back pain, unspecified: Secondary | ICD-10-CM | POA: Diagnosis not present

## 2020-04-21 DIAGNOSIS — M5136 Other intervertebral disc degeneration, lumbar region: Secondary | ICD-10-CM | POA: Diagnosis not present

## 2020-04-21 NOTE — Patient Outreach (Signed)
[facility]) Care Management  04/21/2020  [name] 11/07/1950 [id number]   Telephone Assessment  Successful outreach to patient. Spoke with patient who voices that she continues to have ongoing back problems and pain. She had PCP visit on last week and MD has referred her to another back specialist for second opinion. She goes to appt this afternoon. Patient states that her back problems is her main concern/issue at present. She was unable to really enjoy the recent holiday due to it. She continues to be able to care for her self and has supportive spouse in the home to assist as needed. Appetite remains good. Blood sugars reported ranging in the 80 to mid 100's. She does admit that a few times she did not check cbg or take insulin as she sometimes gets tired of "sticking" herself. RN CM discussed importance of adherence. Patient goes for endocrinologist appt next month and will discuss possibly getting Freestyle meter. She denies any RN CM needs or concerns at this time.    Goals Addressed            This Visit's Progress   . Keep Pain Under Control       Timeframe:  Long-Range Goal Priority:  High Start Date: 02/18/2020                             Expected End Date:  March 2022                    Follow Up Date March 2022   - develop a personal pain management plan - plan exercise or activity when pain is best controlled - prioritize tasks for the day - track times pain is worst and when it is best - track what makes the pain worse and what makes it better - use ice or heat for pain relief - work slower and less intense when having pain    Why is this important?   Day-to-day life can be hard when you have back pain.  Pain medicine is just one piece of the treatment puzzle. There are many things you can do to manage pain and keep your back strong.   Lifestyle changes, like stopping smoking and eating foods with Vitamin D and calcium, keep your bones and  muscles healthy. Your back is better when it is supported by strong muscles.  You can try these action steps to help you manage your pain.     Notes:  04/21/20-Patient going to see another back specialist for second opinion today    . Monitor and Manage My Blood Sugar       Timeframe:  Long-Range Goal Priority:  High Start Date:  02/18/2020                           Expected End Date:    March 2022                  Follow Up Date March2022   - check blood sugar at prescribed times - check blood sugar if I feel it is too high or too low - take the blood sugar meter to all doctor visits    Why is this important?   Checking your blood sugar at home helps to keep it from getting very high or very low.  Writing the results in a diary or log helps the  doctor know how to care for you.  Your blood sugar log should have the time, date and the results.  Also, write down the amount of insulin or other medicine that you take.  Other information, like what you ate, exercise done and how you were feeling, will also be helpful.     Notes:  1/11/222-Patient admits that she has Skipped" checking cbgs and taking insulin at times as she is tired of sticking herself. RN CM reinforced adherence measures.    . Set My Target A1C       Timeframe:  Long-Range Goal Priority:  Medium Start Date:    02/18/2020                         Expected End Date:  April 2022                    Follow Up Date March 2022   - set target A1C-maintain level of 7.0 or less    Why is this important?   Your target A1C is decided together by you and your doctor.  It is based on several things like your age and other health issues.    Notes:  04/21/20-Most recent A1C on file 8.8(Nov 2021)        Plan: RN CM discussed with patient next outreach within the month of March. Patient gave verbal consent and in agreement with RN CM follow up and timeframe. Patient aware that they may contact RN CM sooner for any issues or  concerns. RN CM reviewed goals and plan of care with patient. Patient in agreement.    Enzo Montgomery, RN,BSN,CCM [facility] Telephonic Care Management Coordinator Direct Phone: [phone] Toll Free: [phone] Fax: [phone]

## 2020-04-23 ENCOUNTER — Telehealth: Payer: Self-pay

## 2020-04-23 DIAGNOSIS — M4326 Fusion of spine, lumbar region: Secondary | ICD-10-CM | POA: Diagnosis not present

## 2020-04-23 DIAGNOSIS — M5416 Radiculopathy, lumbar region: Secondary | ICD-10-CM | POA: Diagnosis not present

## 2020-04-23 DIAGNOSIS — M960 Pseudarthrosis after fusion or arthrodesis: Secondary | ICD-10-CM | POA: Diagnosis not present

## 2020-04-23 DIAGNOSIS — M5136 Other intervertebral disc degeneration, lumbar region: Secondary | ICD-10-CM | POA: Diagnosis not present

## 2020-04-23 NOTE — Chronic Care Management (AMB) (Signed)
Chronic Care Management Pharmacy Assistant   Name: [name]  MRN: [id number] DOB: 07-May-1950  Reason for Encounter: Disease State/ Diabetes Adherence Call  PCP : [name], MD  Allergies:   Allergies  Allergen Reactions  . Dilaudid [Hydromorphone Hcl] Nausea And Vomiting  . Hydromorphone   . Lipitor [Atorvastatin] Other (See Comments)    Body & Muscle Aches  . Adhesive [Tape] Rash  . Ampicillin Rash and Other (See Comments)    REACTION: Rash and welts on her hands when she took it in the hospital  . Latex Itching, Dermatitis, Rash and Other (See Comments)    REACTION: Patient had a reaction to tape after surgery and they told her she was allergic to Latex   . Penicillins Rash and Other (See Comments)    Has patient had a PCN reaction causing immediate rash, facial/tongue/throat swelling, SOB or lightheadedness with hypotension: No Has patient had a PCN reaction causing severe rash involving mucus membranes or skin necrosis: No Has patient had a PCN reaction that required hospitalization: Yes - in hospital receiving treatment Has patient had a PCN reaction occurring within the last 10 years: No If all of the above answers are "NO", then may proceed with Cephalosporin use.     Medications: Outpatient Encounter Medications as of 04/23/2020  Medication Sig Note  . acetaminophen (TYLENOL) 500 MG tablet Take 1,000 mg by mouth every 6 (six) hours as needed for moderate pain or headache.   . Alcohol Swabs (B-D SINGLE USE SWABS REGULAR) PADS Use as directed.   [name] Kitchen amLODipine (NORVASC) 10 MG tablet TAKE 1 TABLET BY MOUTH EVERY DAY (Patient taking differently: Take 10 mg by mouth daily.)   . aspirin 81 MG tablet Take 81 mg by mouth daily.   . Blood Glucose Calibration (TRUE METRIX LEVEL 2) Normal SOLN Use as directed   . Blood Glucose Monitoring Suppl (TRUE METRIX AIR GLUCOSE METER) w/Device KIT 1 kit by Does not apply route 3 (three) times daily.   . carvedilol  (COREG) 6.25 MG tablet Take 1 tablet (6.25 mg total) by mouth 2 (two) times daily with a meal. 10/19/2019: Pt takes upon waking in the morning and in the evening at supper. No specified time.  . clotrimazole-betamethasone (LOTRISONE) cream    . Continuous Blood Gluc Sensor (FREESTYLE LIBRE 14 DAY SENSOR) MISC Inject 1 each into the skin every 14 (fourteen) days. Use as directed.   . Continuous Blood Gluc Transmit (DEXCOM G6 TRANSMITTER) MISC 1 Piece by Does not apply route as directed.   . cyclobenzaprine (FLEXERIL) 5 MG tablet Take 1 tablet (5 mg total) by mouth 2 (two) times daily as needed for muscle spasms.   . diclofenac Sodium (VOLTAREN) 1 % GEL Apply 4 g topically 4 (four) times daily as needed. (Patient taking differently: Apply 4 g topically 4 (four) times daily as needed (pain).)   . ferrous gluconate (FERGON) 324 MG tablet TAKE 1 TABLET (324 MG TOTAL) BY MOUTH 2 (TWO) TIMES DAILY WITH A MEAL.   . furosemide (LASIX) 40 MG tablet TAKE 1 TABLET (40 MG TOTAL) BY MOUTH DAILY AS NEEDED (LEG SWELLING). (Patient taking differently: Take 40 mg by mouth daily as needed (leg swelling). Patient taking 59m daily)   . glipiZIDE (GLUCOTROL XL) 5 MG 24 hr tablet TAKE 1 TABLET BY MOUTH EVERY DAY WITH BREAKFAST   . glucose blood (TRUE METRIX BLOOD GLUCOSE TEST) test strip Use as instructed   . hydrALAZINE (APRESOLINE) 50 MG  tablet Patient taking 81m 3x/day   . HYDROcodone-acetaminophen (NORCO/VICODIN) 5-325 MG tablet Take 1 tablet by mouth every 6 (six) hours as needed for moderate pain.   .[name] Kitcheninsulin NPH-regular Human (70-30) 100 UNIT/ML injection Inject 40 Units into the skin See admin instructions. 40 units with breakfast and 40 units with supper for BG above 90 10/09/2019: Took 40 units  . levothyroxine (SYNTHROID) 100 MCG tablet Take 1 tablet (100 mcg total) by mouth daily.   .[name] Kitchenlosartan (COZAAR) 100 MG tablet  12/13/2019: Pt says she takes 1/2 of a 224mtablet  . losartan (COZAAR) 25 MG tablet    .  metoCLOPramide (REGLAN) 10 MG tablet Take 1 tablet (10 mg total) by mouth 4 (four) times daily.   . ondansetron (ZOFRAN) 4 MG tablet Take 1 tablet (4 mg total) by mouth every 8 (eight) hours as needed for nausea or vomiting.   . pantoprazole (PROTONIX) 40 MG tablet Take 1 tablet (40 mg total) by mouth 2 (two) times daily. (Patient taking differently: Take 40 mg by mouth 2 (two) times daily as needed (acid reflux).)   . Polyethyl Glycol-Propyl Glycol (SYSTANE) 0.4-0.3 % SOLN Apply to eye. Taking 2x/day   . polyethylene glycol (MIRALAX / GLYCOLAX) packet Take 17 g by mouth daily as needed for moderate constipation.   . rosuvastatin (CRESTOR) 20 MG tablet Take 1 tablet (20 mg total) by mouth daily at 6 PM.   . traMADol (ULTRAM) 50 MG tablet TAKE 2 TABLETS (100 MG TOTAL) BY MOUTH EVERY 6 (SIX) HOURS AS NEEDED FOR UP TO 7 DAYS.   . TRUEplus Lancets 33G MISC Use as directed    No facility-administered encounter medications on file as of 04/23/2020.    Current Diagnosis: Patient Active Problem List   Diagnosis Date Noted  . Hyperparathyroidism (HCScob[place]09/06/2019  . Elevated hemoglobin (HCPueblito09/06/2019  . Chronic renal impairment, stage 4 (severe) (HCAugusta07/19/2021  . Anemia 10/28/2019  . Physical exam 06/26/2019  . Neck pain 04/18/2019  . Ascending aortic aneurysm (HCParadis01/09/2019  . Hypoxia   . Nonspecific chest pain 04/15/2019  . Neuropathic pain of right flank 12/27/2018  . Dysphagia 12/27/2018  . Postsurgical hypothyroidism 06/11/2018  . Vitamin D deficiency 06/11/2018  . Multinodular goiter 06/11/2018  . Hypercalcemia 06/11/2018  . DM type 2 causing vascular disease (HCBanks  . PONV (postoperative nausea and vomiting)   . Peripheral neuropathy   . Irritable bowel syndrome   . Hypertension   . History of right mastectomy   . H/O hiatal hernia   . GERD (gastroesophageal reflux disease)   . Family history of anesthesia complication   . Diabetes mellitus type 2, uncontrolled, with  complications (HCKopperston  . Lower extremity edema   . Chronic constipation 06/23/2015  . Morbid obesity (HCWhite Haven03/06/2015  . Chronic diastolic heart failure (HCGermanton08/11/2014  . Herniated nucleus pulposus, L2-3 right 02/04/2014    Class: Acute  . Spondylolisthesis of lumbar region 04/30/2012    Class: Chronic  . Lumbar stenosis with neurogenic claudication 04/30/2012    Class: Chronic  . SYNCOPE-CAROTID SINUS 09/16/2008  . OSTEOARTHRITIS 08/23/2006  . DDD (degenerative disc disease), cervical 08/23/2006  . DIMinidokaISEASE, LUMBAR 08/23/2006  . Headache(784.0) 08/23/2006  . BREAST CANCER, HX OF 08/23/2006  . HYPERPARATHYROIDISM, HX OF 08/23/2006  . RENAL CALCULUS, HX OF 08/23/2006    Recent Relevant Labs: Lab Results  Component Value Date/Time   HGBA1C 8.8 (A) 03/09/2020 11:08 AM   HGBA1C 6.9 (A) 12/05/2019  10:45 AM   HGBA1C 8.4 (H) 08/05/2019 03:15 PM   HGBA1C 8.9 (A) 07/03/2019 03:54 PM   HGBA1C 10.3 (H) 03/01/2019 02:07 PM   MICROALBUR 570.4 08/05/2019 03:15 PM   MICROALBUR 188.7 06/01/2018 03:25 PM    Kidney Function Lab Results  Component Value Date/Time   CREATININE 1.68 (H) 10/28/2019 01:32 PM   CREATININE 2.25 (H) 10/19/2019 03:30 AM   CREATININE 1.64 (H) 09/19/2019 02:20 PM   CREATININE 1.55 (H) 08/05/2019 03:15 PM   CREATININE 1.39 (H) 03/01/2019 02:07 PM   GFR 36.33 (L) 10/28/2019 01:32 PM   GFRNONAA 21 (L) 10/19/2019 03:30 AM   GFRNONAA 31 (L) 09/19/2019 02:20 PM   GFRNONAA 38 (L) 03/01/2019 02:07 PM   GFRAA 25 (L) 10/19/2019 03:30 AM   GFRAA 36 (L) 09/19/2019 02:20 PM   GFRAA 44 (L) 03/01/2019 02:07 PM    . Current antihyperglycemic regimen:  o Glipizide 5 mg tablet daily with breakfast o Insulin NPH-regular Human (70-30) 100 unit/mL  . What recent interventions/DTPs have been made to improve glycemic control:  o Patient states she has not had any interventions or DTPs at this time  . Have there been any recent hospitalizations or ED visits since last visit  with [name]? No , the patient has not had any recently hospitalizations or ED visits since her last visit with [name].  [name] Kitchen Patient denies hypoglycemic symptoms, including Pale, Sweaty, Shaky, Nervous/irritable and Vision changes.   . Patient denies hyperglycemic symptoms, including blurry vision, excessive thirst, fatigue, polyuria and weakness.  . How often are you checking your blood sugar? in the morning before eating or drinking, before breakfast, before lunch and at bedtime   . What are your blood sugars ranging?  o Fasting: 100-116 o Before meals: 140 o After meals: 161 o Bedtime: 189, 200's  . During the week, how often does your blood glucose drop below 70? Patient states she has had one low reading of 66 on 03/28/2020. Patient denies any other low blood glucose levels below 66.  . Are you checking your feet daily/regularly?  Patient states she checks her feet regularly. Patient denies any abnormalities of her feet. However, patient reports swelling and pain in her feet all the time..   Adherence Review:  Is the patient currently on a STATIN medication? Yes Is the patient currently on ACE/ARB medication? Yes Does the patient have >5 day gap between last estimated fill dates? No   April D Calhoun, [place] Pharmacist Assistant [phone]  Follow-Up:  Pharmacist Review

## 2020-04-24 ENCOUNTER — Ambulatory Visit: Payer: Self-pay

## 2020-04-27 ENCOUNTER — Other Ambulatory Visit: Payer: Self-pay | Admitting: Thoracic Surgery (Cardiothoracic Vascular Surgery)

## 2020-04-27 DIAGNOSIS — I712 Thoracic aortic aneurysm, without rupture, unspecified: Secondary | ICD-10-CM

## 2020-04-29 ENCOUNTER — Encounter: Payer: Self-pay | Admitting: Family Medicine

## 2020-04-29 ENCOUNTER — Telehealth (INDEPENDENT_AMBULATORY_CARE_PROVIDER_SITE_OTHER): Payer: Medicare HMO | Admitting: Family Medicine

## 2020-04-29 DIAGNOSIS — M5137 Other intervertebral disc degeneration, lumbosacral region: Secondary | ICD-10-CM

## 2020-04-29 DIAGNOSIS — M816 Localized osteoporosis [Lequesne]: Secondary | ICD-10-CM | POA: Diagnosis not present

## 2020-04-29 NOTE — Progress Notes (Signed)
I connected with  [name] on 04/29/20 by a video enabled telemedicine application and verified that I am speaking with the correct person using two identifiers.   I discussed the limitations of evaluation and management by telemedicine. The patient expressed understanding and agreed to proceed.

## 2020-04-29 NOTE — Progress Notes (Signed)
Virtual Visit via Video   I connected with patient on 04/29/20 at 12:30 PM EST by a video enabled telemedicine application and verified that I am speaking with the correct person using two identifiers.  Location patient: Home Location provider: [facility], Office Persons participating in the virtual visit: Patient, Provider, [place] (Sabrina M)  I discussed the limitations of evaluation and management by telemedicine and the availability of in person appointments. The patient expressed understanding and agreed to proceed.  Subjective:   HPI:   Back pain- pt saw [facility] and was told 'that something didn't heal right' and that 'there's a crack in my vertebrae'.  It was suggested she get a spinal cord stimulator.  Wants to know my thoughts on this.  Was told surgery is not an option.  Pain meds do not improve pain but cause drowsiness  Osteoporosis- noted at R femur on DEXA done 03/25/20.  Tscore: -2.5.  Was told to ask PCP about starting treatment.  ROS:   See pertinent positives and negatives per HPI.  Patient Active Problem List   Diagnosis Date Noted  . Hyperparathyroidism ([place]) 12/13/2019  . Elevated hemoglobin ([place]) 12/13/2019  . Chronic renal impairment, stage 4 (severe) ([place]) 10/28/2019  . Anemia 10/28/2019  . Physical exam 06/26/2019  . Neck pain 04/18/2019  . Ascending aortic aneurysm ([place]) 04/17/2019  . Hypoxia   . Nonspecific chest pain 04/15/2019  . Neuropathic pain of right flank 12/27/2018  . Dysphagia 12/27/2018  . Postsurgical hypothyroidism 06/11/2018  . Vitamin D deficiency 06/11/2018  . Multinodular goiter 06/11/2018  . Hypercalcemia 06/11/2018  . DM type 2 causing vascular disease ([place])   . PONV (postoperative nausea and vomiting)   . Peripheral neuropathy   . Irritable bowel syndrome   . Hypertension   . History of right mastectomy   . H/O hiatal hernia   . GERD (gastroesophageal reflux disease)   . Family history of  anesthesia complication   . Diabetes mellitus type 2, uncontrolled, with complications ([place])   . Lower extremity edema   . Chronic constipation 06/23/2015  . Morbid obesity ([place]) 06/12/2015  . Chronic diastolic heart failure ([place]) 11/17/2014  . Herniated nucleus pulposus, L2-3 right 02/04/2014    Class: Acute  . Spondylolisthesis of lumbar region 04/30/2012    Class: Chronic  . Lumbar stenosis with neurogenic claudication 04/30/2012    Class: Chronic  . SYNCOPE-CAROTID SINUS 09/16/2008  . OSTEOARTHRITIS 08/23/2006  . DDD (degenerative disc disease), cervical 08/23/2006  . [place] DISEASE, LUMBAR 08/23/2006  . Headache(784.0) 08/23/2006  . BREAST CANCER, HX OF 08/23/2006  . HYPERPARATHYROIDISM, HX OF 08/23/2006  . RENAL CALCULUS, HX OF 08/23/2006    Social History   Tobacco Use  . Smoking status: Never Smoker  . Smokeless tobacco: Never Used  Substance Use Topics  . Alcohol use: No    Current Outpatient Medications:  .  acetaminophen (TYLENOL) 500 MG tablet, Take 1,000 mg by mouth every 6 (six) hours as needed for moderate pain or headache., Disp: , Rfl:  .  Alcohol Swabs (B-D SINGLE USE SWABS REGULAR) PADS, Use as directed., Disp: 100 each, Rfl: 1 .  amLODipine (NORVASC) 10 MG tablet, TAKE 1 TABLET BY MOUTH EVERY DAY (Patient taking differently: Take 10 mg by mouth daily.), Disp: 90 tablet, Rfl: 3 .  aspirin 81 MG tablet, Take 81 mg by mouth daily., Disp: , Rfl:  .  Blood Glucose Calibration (TRUE METRIX LEVEL 2) Normal SOLN, Use as directed, Disp:  3 each, Rfl: 3 .  Blood Glucose Monitoring Suppl (TRUE METRIX AIR GLUCOSE METER) w/Device KIT, 1 kit by Does not apply route 3 (three) times daily., Disp: 1 kit, Rfl: 0 .  carvedilol (COREG) 6.25 MG tablet, Take 1 tablet (6.25 mg total) by mouth 2 (two) times daily with a meal., Disp: 180 tablet, Rfl: 1 .  clotrimazole-betamethasone (LOTRISONE) cream, , Disp: , Rfl:  .  Continuous Blood Gluc Sensor (FREESTYLE LIBRE 14 DAY SENSOR) MISC,  Inject 1 each into the skin every 14 (fourteen) days. Use as directed., Disp: 2 each, Rfl: 2 .  Continuous Blood Gluc Transmit (DEXCOM G6 TRANSMITTER) MISC, 1 Piece by Does not apply route as directed., Disp: 1 each, Rfl: 1 .  diclofenac Sodium (VOLTAREN) 1 % GEL, Apply 4 g topically 4 (four) times daily as needed. (Patient taking differently: Apply 4 g topically 4 (four) times daily as needed (pain).), Disp: 100 g, Rfl: 0 .  ferrous gluconate (FERGON) 324 MG tablet, TAKE 1 TABLET (324 MG TOTAL) BY MOUTH 2 (TWO) TIMES DAILY WITH A MEAL., Disp: 180 tablet, Rfl: 2 .  furosemide (LASIX) 40 MG tablet, TAKE 1 TABLET (40 MG TOTAL) BY MOUTH DAILY AS NEEDED (LEG SWELLING). (Patient taking differently: Take 40 mg by mouth daily as needed (leg swelling). Patient taking 67m daily), Disp: 90 tablet, Rfl: 1 .  glipiZIDE (GLUCOTROL XL) 5 MG 24 hr tablet, TAKE 1 TABLET BY MOUTH EVERY DAY WITH BREAKFAST, Disp: 90 tablet, Rfl: 0 .  glucose blood (TRUE METRIX BLOOD GLUCOSE TEST) test strip, Use as instructed, Disp: 100 each, Rfl: 12 .  hydrALAZINE (APRESOLINE) 50 MG tablet, Patient taking 267m3x/day, Disp: , Rfl:  .  insulin NPH-regular Human (70-30) 100 UNIT/ML injection, Inject 40 Units into the skin See admin instructions. 40 units with breakfast and 40 units with supper for BG above 90, Disp: , Rfl:  .  levothyroxine (SYNTHROID) 100 MCG tablet, Take 1 tablet (100 mcg total) by mouth daily., Disp: 90 tablet, Rfl: 1 .  losartan (COZAAR) 100 MG tablet, , Disp: , Rfl:  .  losartan (COZAAR) 25 MG tablet, , Disp: , Rfl:  .  metoCLOPramide (REGLAN) 10 MG tablet, Take 1 tablet (10 mg total) by mouth 4 (four) times daily., Disp: 120 tablet, Rfl: 1 .  ondansetron (ZOFRAN) 4 MG tablet, Take 1 tablet (4 mg total) by mouth every 8 (eight) hours as needed for nausea or vomiting., Disp: 30 tablet, Rfl: 1 .  pantoprazole (PROTONIX) 40 MG tablet, Take 1 tablet (40 mg total) by mouth 2 (two) times daily. (Patient taking  differently: Take 40 mg by mouth 2 (two) times daily as needed (acid reflux).), Disp: 180 tablet, Rfl: 1 .  polyethylene glycol (MIRALAX / GLYCOLAX) packet, Take 17 g by mouth daily as needed for moderate constipation., Disp: , Rfl:  .  rosuvastatin (CRESTOR) 20 MG tablet, Take 1 tablet (20 mg total) by mouth daily at 6 PM., Disp: 90 tablet, Rfl: 1 .  TRUEplus Lancets 33G MISC, Use as directed, Disp: 300 each, Rfl: 3 .  cyclobenzaprine (FLEXERIL) 5 MG tablet, Take 1 tablet (5 mg total) by mouth 2 (two) times daily as needed for muscle spasms. (Patient not taking: Reported on 04/29/2020), Disp: 15 tablet, Rfl: 0 .  HYDROcodone-acetaminophen (NORCO/VICODIN) 5-325 MG tablet, Take 1 tablet by mouth every 6 (six) hours as needed for moderate pain. (Patient not taking: Reported on 04/29/2020), Disp: 30 tablet, Rfl: 0 .  Polyethyl Glycol-Propyl Glycol (SYSTANE) 0.4-0.3 %  SOLN, Apply to eye. Taking 2x/day (Patient not taking: Reported on 04/29/2020), Disp: , Rfl:  .  traMADol (ULTRAM) 50 MG tablet, TAKE 2 TABLETS (100 MG TOTAL) BY MOUTH EVERY 6 (SIX) HOURS AS NEEDED FOR UP TO 7 DAYS. (Patient not taking: Reported on 04/29/2020), Disp: 40 tablet, Rfl: 0  Allergies  Allergen Reactions  . Dilaudid [Hydromorphone Hcl] Nausea And Vomiting  . Hydromorphone   . Lipitor [Atorvastatin] Other (See Comments)    Body & Muscle Aches  . Adhesive [Tape] Rash  . Ampicillin Rash and Other (See Comments)    REACTION: Rash and welts on her hands when she took it in the hospital  . Latex Itching, Dermatitis, Rash and Other (See Comments)    REACTION: Patient had a reaction to tape after surgery and they told her she was allergic to Latex   . Penicillins Rash and Other (See Comments)    Has patient had a PCN reaction causing immediate rash, facial/tongue/throat swelling, SOB or lightheadedness with hypotension: No Has patient had a PCN reaction causing severe rash involving mucus membranes or skin necrosis: No Has patient  had a PCN reaction that required hospitalization: Yes - in hospital receiving treatment Has patient had a PCN reaction occurring within the last 10 years: No If all of the above answers are "NO", then may proceed with Cephalosporin use.     Objective:   There were no vitals taken for this visit. AAOx3, NAD NCAT, EOMI No obvious CN deficits Coloring WNL Pt is able to speak clearly, coherently without shortness of breath or increased work of breathing.  Thought process is linear.  Mood is appropriate.   Assessment and Plan:   Lumbar disc disease- pt is following up from her recent visit to [facility].  Was very pleased with her evaluation and wanted to discuss possibility of spinal cord stimulator.  Reviewed with pt that her back sxs are intrusive, daily, and at times severe.  They are also not relieved w/ pain medication.  Since surgery is not an option, a spinal cord stimulator seems like a good alternative.  Pt to follow up with back specialist to determine how best to proceed.  Osteoporosis- new.  Noted at R femur w/ Tscore -2.5  Discussed options for treatment.  Pt has issues w/ reflux so bisphosponates are not the best choice.  Will proceed w/ prior authorization for Prolia as this is best option at this time.  Pt expressed understanding and is in agreement w/ plan.    Annye Asa, MD 04/29/2020  .

## 2020-05-01 DIAGNOSIS — M816 Localized osteoporosis [Lequesne]: Secondary | ICD-10-CM | POA: Insufficient documentation

## 2020-05-07 DIAGNOSIS — M546 Pain in thoracic spine: Secondary | ICD-10-CM | POA: Diagnosis not present

## 2020-05-07 DIAGNOSIS — M5416 Radiculopathy, lumbar region: Secondary | ICD-10-CM | POA: Diagnosis not present

## 2020-05-07 DIAGNOSIS — M960 Pseudarthrosis after fusion or arthrodesis: Secondary | ICD-10-CM | POA: Diagnosis not present

## 2020-05-07 DIAGNOSIS — M5136 Other intervertebral disc degeneration, lumbar region: Secondary | ICD-10-CM | POA: Diagnosis not present

## 2020-05-12 ENCOUNTER — Telehealth: Payer: Self-pay

## 2020-05-12 NOTE — Telephone Encounter (Signed)
Patient contacted the office concerned about having chest and back pain.  She is being seen in the office by Dr. Kipp Brood for 4 cm aortic aneurysm.  She is due back in the office next week for CTA and visit.  She stated that she was having sharp chest pain that went straight to her back.  She stated that it "flared up" last night and has not stopped. Currently sitting in the living room and hasn't moved much.  Patient was advised that she should contact 911 or go to the nearest emergency room for further evaluation.  She was hesitant due to waiting and past experiences of finding nothing wrong.  Advised that she should be evaluated and she acknowledged receipt.

## 2020-05-13 ENCOUNTER — Other Ambulatory Visit: Payer: Self-pay | Admitting: Rehabilitation

## 2020-05-13 DIAGNOSIS — M546 Pain in thoracic spine: Secondary | ICD-10-CM

## 2020-05-14 ENCOUNTER — Ambulatory Visit
Admission: RE | Admit: 2020-05-14 | Discharge: 2020-05-14 | Disposition: A | Discharge status: Home / Self Care | Payer: Medicare HMO | Source: Ambulatory Visit | Attending: Rehabilitation | Admitting: Rehabilitation

## 2020-05-14 ENCOUNTER — Other Ambulatory Visit: Payer: Self-pay

## 2020-05-14 DIAGNOSIS — M5124 Other intervertebral disc displacement, thoracic region: Secondary | ICD-10-CM | POA: Diagnosis not present

## 2020-05-14 DIAGNOSIS — M5125 Other intervertebral disc displacement, thoracolumbar region: Secondary | ICD-10-CM | POA: Diagnosis not present

## 2020-05-14 DIAGNOSIS — Z853 Personal history of malignant neoplasm of breast: Secondary | ICD-10-CM | POA: Diagnosis not present

## 2020-05-14 DIAGNOSIS — M546 Pain in thoracic spine: Secondary | ICD-10-CM

## 2020-05-14 DIAGNOSIS — M47814 Spondylosis without myelopathy or radiculopathy, thoracic region: Secondary | ICD-10-CM | POA: Diagnosis not present

## 2020-05-19 ENCOUNTER — Telehealth: Payer: Self-pay | Admitting: Family Medicine

## 2020-05-19 NOTE — Telephone Encounter (Signed)
Spoke with spouse. Called patient to schedule Annual Wellness Visit.  Please schedule with Nurse Health Advisor Caroleen Hamman, RN at [facility]

## 2020-05-27 ENCOUNTER — Telehealth: Payer: Self-pay | Admitting: Family Medicine

## 2020-05-27 NOTE — Telephone Encounter (Signed)
Pt called in asking for an update on when dr. Birdie Riddle wanted her to start the prolia injection?   She also wanted to know is there something else that she can take besides taking the prolia injection?  And does Dr. Birdie Riddle think that she really needs to do the prolia injection?   Pt can be reached at the home #

## 2020-05-27 NOTE — Telephone Encounter (Signed)
Prolia verification started thru Prolia portal. Will take up to 5 days to receive benefits documents of what patient will owe for the shot. Dr Birdie Riddle doesn't want patient to take oral medication for her bones due to having reflux. Osteoporosis medication can worsened reflux symptoms.

## 2020-05-29 ENCOUNTER — Encounter: Payer: Self-pay | Admitting: Thoracic Surgery (Cardiothoracic Vascular Surgery)

## 2020-05-29 ENCOUNTER — Other Ambulatory Visit: Payer: Self-pay

## 2020-05-29 ENCOUNTER — Ambulatory Visit: Payer: Medicare HMO | Admitting: Thoracic Surgery (Cardiothoracic Vascular Surgery)

## 2020-05-29 ENCOUNTER — Telehealth: Payer: Self-pay | Admitting: Critical Care Medicine

## 2020-05-29 ENCOUNTER — Ambulatory Visit
Admission: RE | Admit: 2020-05-29 | Discharge: 2020-05-29 | Disposition: A | Discharge status: Home / Self Care | Payer: Medicare HMO | Source: Ambulatory Visit | Attending: Thoracic Surgery (Cardiothoracic Vascular Surgery) | Admitting: Thoracic Surgery (Cardiothoracic Vascular Surgery)

## 2020-05-29 VITALS — BP 153/76 | HR 66 | Resp 20 | Ht 64.0 in | Wt 210.0 lb

## 2020-05-29 DIAGNOSIS — I712 Thoracic aortic aneurysm, without rupture, unspecified: Secondary | ICD-10-CM

## 2020-05-29 DIAGNOSIS — I2510 Atherosclerotic heart disease of native coronary artery without angina pectoris: Secondary | ICD-10-CM | POA: Diagnosis not present

## 2020-05-29 DIAGNOSIS — J9811 Atelectasis: Secondary | ICD-10-CM | POA: Diagnosis not present

## 2020-05-29 DIAGNOSIS — J439 Emphysema, unspecified: Secondary | ICD-10-CM | POA: Diagnosis not present

## 2020-05-29 NOTE — Telephone Encounter (Signed)
Ms. Otterson is a 70 y/o woman with a diagnosis of OSA who did not complete testing. Due to uncontrolled HTN and ascending aneurysm, she has been encouraged to comply with workup and treatment by Dr. Kipp Brood and she has agreed. She will need to establish with a new provider in the office who does sleep medicine. Thanks!  Julian Hy, DO 05/29/20 1:11 PM [facility]

## 2020-05-29 NOTE — Telephone Encounter (Signed)
Attempted to call pt's home number but line just rang and rang and no machine ever kicked in. Attempted to call pt on mobile number but line went straight to VM.  Left message for pt to return call.   When pt returns call, pt needs to be scheduled for a sleep consult with either Dr. Jenetta Downer, Dr. Elsworth Soho, or Dr. Halford Chessman.

## 2020-05-29 NOTE — Progress Notes (Signed)
[address]       [place] [address]             [id number]                    [name] Medical Record #[id number] Date of Birth: November 05, 1950  Referring: [name], MD Primary Care: [name], MD Primary Cardiologist: Will Meredith Leeds, MD  Chief Complaint:    Chief Complaint  Patient presents with  . Thoracic Aortic Aneurysm    Yearly f/u with CT chest today    History of Present Illness:    [name] 70 y.o. female presents in follow-up for surveillance of her 4 cm ascending aortic aneurysm.  She has multiple complaints today.  She complains of back pain which is chronic, and is potentially scheduled for a nerve stimulator.  She also complains of chronic chest pain that has been persistent for several months.  She states that this pain is always present on occasion gets better.  She is unclear of any aggravating or alleviating factors.  She describes his chest pain as pressure-like sensation that starts in the middle of her chest and radiates to her back.  She has previously been evaluated by Dr. Oval Linsey, as well as Dr. Carlis Abbott chest pain hypertension that was seen on imaging.  Per the record she underwent 1 sleep study that was concerning but never had a follow-up study to confirm the presence of sleep apnea.  She also has had a cough for the past 3 weeks that she states is making her chest pain worse.    Past Medical History:  Diagnosis Date  . Anginal pain ([place]) 03/19/2012   saw Dr. Cathie Olden .. she thinks its more related to stomach issues  . Arthritis    "all over; mostly in my back" (03/20/2012)  . Breast cancer ([place])    "right" (03/20/2012)  . Cardiomyopathy   . Chest pain 06/2005   Hospitalized, dystolic dysfunction  . Chronic lower back pain    "have 2 herniated discs; going to have to have a fusion" (03/20/2012)  . CKD (chronic kidney disease), stage III ([place])   . Family history of anesthesia  complication    "father has nausea and vomiting"  . Gallstones   . GERD (gastroesophageal reflux disease)   . H/O hiatal hernia   . History of kidney stones   . History of right mastectomy   . Hypertension    "patient states never had HTN, takes Hyzaar for heart  . Hypothyroidism   . Irritable bowel syndrome   . Migraines    "it's been a long time since I've had one" (03/20/2012)  . Osteoarthritis   . Peripheral neuropathy   . PONV (postoperative nausea and vomiting)   . Sciatica   . Type II diabetes mellitus ([place])     Past Surgical History:  Procedure Laterality Date  . ABDOMINAL HYSTERECTOMY  1993  . adenosin cardiolite  07/2005   (+) wall motion abnormality  . AXILLARY LYMPH NODE DISSECTION  2003   squamous cell cancer  . BACK SURGERY    . CARDIAC CATHETERIZATION  ? 2005 / 2007  . CHOLECYSTECTOMY N/A 01/24/2018   Procedure: LAPAROSCOPIC CHOLECYSTECTOMY;  Surgeon: Erroll Luna, MD;  Location: [facility];  Service: General;  Laterality: N/A;  . CT abdomen and pelvis  02/2010   Same  . ESOPHAGOGASTRODUODENOSCOPY  2005   GERD  .  KNEE ARTHROSCOPY  1990's   "right" (03/20/2012)  . LUMBAR FUSION N/A 08/22/2014   Procedure: Right L2-3 and right L1-2 transforaminal lumbar interbody fusion with pedicle screws, rods, sleeves, cages, local bone graft, Vivigen, cancellous chips;  Surgeon: Jessy Oto, MD;  Location: [facility];  Service: Orthopedics;  Laterality: N/A;  . LUMBAR LAMINECTOMY/DECOMPRESSION MICRODISCECTOMY N/A 02/04/2014   Procedure: RIGHT L2-3 MICRODISCECTOMY ;  Surgeon: Jessy Oto, MD;  Location: [facility];  Service: Orthopedics;  Laterality: N/A;  . MASTECTOMY Right   . MASTECTOMY WITH AXILLARY LYMPH NODE DISSECTION  12/2003   "right" (03/20/2012)  . POSTERIOR CERVICAL FUSION/FORAMINOTOMY  2001  . SHOULDER ARTHROSCOPY W/ ROTATOR CUFF REPAIR  2003   Left  . THYROIDECTOMY  1977   Right  . TOTAL KNEE ARTHROPLASTY  2000   Right  . [place] of abdomen  07/2005   Fatty liver /  kidney stones    Family History  Problem Relation Age of Onset  . Hypertension Mother   . Breast cancer Mother 52  . Diabetes Father   . Hypertension Father   . Hypertension Sister   . Hypertension Brother   . Hypertension Brother   . Diabetes Brother   . Diabetes Brother   . Diabetes Other        Family Hx. of Diabetes  . Hypertension Other        Family History of HTN  . Deep vein thrombosis Daughter   . Breast cancer Maternal Aunt   . Colon cancer Neg Hx   . Esophageal cancer Neg Hx   . Liver cancer Neg Hx   . Rectal cancer Neg Hx   . Stomach cancer Neg Hx      Social History   Tobacco Use  Smoking Status Never Smoker  Smokeless Tobacco Never Used    Social History   Substance and Sexual Activity  Alcohol Use No     Allergies  Allergen Reactions  . Dilaudid [Hydromorphone Hcl] Nausea And Vomiting  . Hydromorphone   . Lipitor [Atorvastatin] Other (See Comments)    Body & Muscle Aches  . Adhesive [Tape] Rash  . Ampicillin Rash and Other (See Comments)    REACTION: Rash and welts on her hands when she took it in the hospital  . Latex Itching, Dermatitis, Rash and Other (See Comments)    REACTION: Patient had a reaction to tape after surgery and they told her she was allergic to Latex   . Penicillins Rash and Other (See Comments)    Has patient had a PCN reaction causing immediate rash, facial/tongue/throat swelling, SOB or lightheadedness with hypotension: No Has patient had a PCN reaction causing severe rash involving mucus membranes or skin necrosis: No Has patient had a PCN reaction that required hospitalization: Yes - in hospital receiving treatment Has patient had a PCN reaction occurring within the last 10 years: No If all of the above answers are "NO", then may proceed with Cephalosporin use.     Current Outpatient Medications  Medication Sig Dispense Refill  . acetaminophen (TYLENOL) 500 MG tablet Take 1,000 mg by mouth every 6 (six) hours as  needed for moderate pain or headache.    . Alcohol Swabs (B-D SINGLE USE SWABS REGULAR) PADS Use as directed. 100 each 1  . amLODipine (NORVASC) 10 MG tablet TAKE 1 TABLET BY MOUTH EVERY DAY (Patient taking differently: Take 10 mg by mouth daily.) 90 tablet 3  . aspirin 81 MG tablet Take 81 mg by mouth  daily.    . Blood Glucose Calibration (TRUE METRIX LEVEL 2) Normal SOLN Use as directed 3 each 3  . Blood Glucose Monitoring Suppl (TRUE METRIX AIR GLUCOSE METER) w/Device KIT 1 kit by Does not apply route 3 (three) times daily. 1 kit 0  . carvedilol (COREG) 6.25 MG tablet Take 1 tablet (6.25 mg total) by mouth 2 (two) times daily with a meal. 180 tablet 1  . clotrimazole-betamethasone (LOTRISONE) cream     . Continuous Blood Gluc Sensor (FREESTYLE LIBRE 14 DAY SENSOR) MISC Inject 1 each into the skin every 14 (fourteen) days. Use as directed. 2 each 2  . Continuous Blood Gluc Transmit (DEXCOM G6 TRANSMITTER) MISC 1 Piece by Does not apply route as directed. 1 each 1  . ferrous gluconate (FERGON) 324 MG tablet TAKE 1 TABLET (324 MG TOTAL) BY MOUTH 2 (TWO) TIMES DAILY WITH A MEAL. 180 tablet 2  . furosemide (LASIX) 40 MG tablet TAKE 1 TABLET (40 MG TOTAL) BY MOUTH DAILY AS NEEDED (LEG SWELLING). (Patient taking differently: Take 40 mg by mouth daily as needed (leg swelling). Patient taking 79m daily) 90 tablet 1  . glipiZIDE (GLUCOTROL XL) 5 MG 24 hr tablet TAKE 1 TABLET BY MOUTH EVERY DAY WITH BREAKFAST 90 tablet 0  . glucose blood (TRUE METRIX BLOOD GLUCOSE TEST) test strip Use as instructed 100 each 12  . hydrALAZINE (APRESOLINE) 50 MG tablet Patient taking 283m3x/day    . insulin NPH-regular Human (70-30) 100 UNIT/ML injection Inject 40 Units into the skin See admin instructions. 40 units with breakfast and 40 units with supper for BG above 90    . levothyroxine (SYNTHROID) 100 MCG tablet Take 1 tablet (100 mcg total) by mouth daily. 90 tablet 1  . metoCLOPramide (REGLAN) 10 MG tablet Take 1  tablet (10 mg total) by mouth 4 (four) times daily. 120 tablet 1  . ondansetron (ZOFRAN) 4 MG tablet Take 1 tablet (4 mg total) by mouth every 8 (eight) hours as needed for nausea or vomiting. 30 tablet 1  . pantoprazole (PROTONIX) 40 MG tablet Take 1 tablet (40 mg total) by mouth 2 (two) times daily. (Patient taking differently: Take 40 mg by mouth 2 (two) times daily as needed (acid reflux).) 180 tablet 1  . Polyethyl Glycol-Propyl Glycol (SYSTANE) 0.4-0.3 % SOLN Apply to eye. Taking 2x/day    . polyethylene glycol (MIRALAX / GLYCOLAX) packet Take 17 g by mouth daily as needed for moderate constipation.    . rosuvastatin (CRESTOR) 20 MG tablet Take 1 tablet (20 mg total) by mouth daily at 6 PM. 90 tablet 1  . TRUEplus Lancets 33G MISC Use as directed 300 each 3  . losartan (COZAAR) 100 MG tablet  (Patient not taking: Reported on 05/29/2020)    . losartan (COZAAR) 25 MG tablet  (Patient not taking: Reported on 05/29/2020)     No current facility-administered medications for this visit.    Review of Systems  Constitutional: Positive for malaise/fatigue.  Respiratory: Positive for cough and shortness of breath.   Cardiovascular: Positive for chest pain and leg swelling.  Musculoskeletal: Positive for back pain, joint pain and myalgias.  Neurological: Negative.     PHYSICAL EXAMINATION: BP (!) 153/76 (BP Location: Left Arm, Patient Position: Sitting)   Pulse 66   Resp 20   Ht '5\' 4"'  (1.626 m)   Wt 210 lb (95.3 kg)   SpO2 90% Comment: RA  BMI 36.05 kg/m   Physical Exam Constitutional:  General: She is not in acute distress.    Appearance: She is not ill-appearing or toxic-appearing.  HENT:     Head: Normocephalic.  Eyes:     Extraocular Movements: Extraocular movements intact.  Cardiovascular:     Rate and Rhythm: Normal rate.  Pulmonary:     Effort: Pulmonary effort is normal. No respiratory distress.  Abdominal:     General: There is no distension.  Musculoskeletal:         General: Swelling present.     Cervical back: Normal range of motion.     Right lower leg: Edema present.     Left lower leg: Edema present.  Neurological:     Mental Status: She is alert.      Diagnostic Studies & Laboratory data:     Recent Radiology Findings:   CT CHEST WO CONTRAST  Result Date: 05/29/2020 CLINICAL DATA:  Ascending thoracic aortic prominence EXAM: CT CHEST WITHOUT CONTRAST TECHNIQUE: Multidetector CT imaging of the chest was performed following the standard protocol without IV contrast. COMPARISON:  Chest CT April 15, 2019 FINDINGS: Cardiovascular: Ascending thoracic aortic diameter measures 4.2 x 4.2 cm, stable compared to prior study. Measured diameter of the aorta the sinuses of Valsalva measures 3.1 cm. Measured diameter of the aorta at the arch level measures 3.4 cm. Measured diameter of the descending aorta at the main pulmonary outflow tract level measures 3.1 x 3.0 cm. There are scattered foci of aortic atherosclerosis as well as calcification in visualized great vessels. There are foci of coronary artery calcification. A slight amount of pericardial fluid is within physiologic range. No pericardial thickening is evident. The main pulmonary outflow tract measures 3.6 cm in diameter, prominent. Mediastinum/Nodes: Right thyroid and isthmus have been removed. Left thyroid appears normal and stable. There is no evident thoracic adenopathy. No esophageal lesions are appreciable. Lungs/Pleura: There are scattered areas of atelectatic change. Small bulla is noted in the right lower lobe, stable. There is less mosaic attenuation of the lungs compared to prior study. There is no edema or consolidation. No pleural effusions are evident. No appreciable pneumothorax. Trachea and major bronchial structures appear patent. Upper Abdomen: Gallbladder is absent. There is upper abdominal aortic atherosclerosis. Visualized upper abdominal structures otherwise appear unremarkable.  Musculoskeletal: There are foci of degenerative change in the thoracic spine. No blastic or lytic bone lesions. No evident chest wall lesions. IMPRESSION: 1. Ascending thoracic aorta measures 4.2 x 4.2 cm in diameter, stable. Recommend annual imaging followup by CTA or MRA. This recommendation follows 2010 ACCF/AHA/AATS/ACR/ASA/SCA/SCAI/SIR/STS/SVM Guidelines for the Diagnosis and Management of Patients with Thoracic Aortic Disease. Circulation. 2010; 121: M010-U725. Aortic aneurysm NOS (ICD10-I71.9). Areas of great vessel calcification and aortic atherosclerosis noted. Foci of coronary artery calcification noted. 2. Prominence of the main pulmonary outflow tract is likely due to a degree of pulmonary arterial hypertension. 3. Areas of scattered atelectasis in the lungs. No edema or consolidation. Less mosaic appearance compared to the previous study. A mild degree of underlying small airways obstructive disease is likely. No pleural effusions. 4.  No evident adenopathy. 5.  Gallbladder absent. Aortic Atherosclerosis (ICD10-I70.0). Electronically Signed   By: Lowella Grip III M.D.   On: 05/29/2020 08:56   MR THORACIC SPINE WO CONTRAST  Result Date: 05/14/2020 CLINICAL DATA:  Back pain.  History of breast cancer. EXAM: MRI THORACIC SPINE WITHOUT CONTRAST TECHNIQUE: Multiplanar, multisequence MR imaging of the thoracic spine was performed. No intravenous contrast was administered. COMPARISON:  MRI 05/28/2017.  CT 10/25/2019 FINDINGS:  Alignment: Preserved thoracic kyphosis without static listhesis. There is retrolisthesis of L1 on L2 within the visualized upper lumbar spine. Vertebrae: Thoracic vertebral body heights are maintained without fracture. No evidence of discitis. No suspicious bone lesion. Partially visualized lumbar fusion hardware. The transpedicular screws at the L2 and L3 levels are protruding through the superior endplates, as seen on previous studies, unchanged. Cord:  Normal signal and  morphology. Paraspinal and other soft tissues: Negative. Disc levels: T1-T2: Tiny central protrusion.  No canal or foraminal stenosis. T2-T3: Tiny central protrusion.  No foraminal or canal stenosis. T3-T4: Negative. T4-T5: Tiny central protrusion.  No foraminal or canal stenosis. T5-T6: Tiny central protrusion.  No foraminal or canal stenosis. T6-T7: Tiny central protrusion.  No foraminal or canal stenosis. T7-T8: Tiny central protrusion.  No foraminal or canal stenosis. T8-T9: Negative. T9-T10: Negative. T10-T11: Negative. T11-T12: Negative. T12-L1: Central disc extrusion with caudal migration, unchanged. No foraminal or canal stenosis. IMPRESSION: 1. No acute abnormality of the thoracic spine. 2. Mild degenerative changes of the thoracic spine, similar in appearance to prior MRI. No foraminal or canal stenosis at any level. 3. Partially visualized lumbar fusion hardware with transpedicular screws at the L2 and L3 levels protruding through the superior endplates, unchanged from prior studies. Electronically Signed   By: Davina Poke D.O.   On: 05/14/2020 16:29       I have independently reviewed the above radiology studies  and reviewed the findings with the patient.   Recent Lab Findings: Lab Results  Component Value Date   WBC 8.9 10/28/2019   HGB 11.5 (L) 10/28/2019   HCT 34.6 (L) 10/28/2019   PLT 340.0 10/28/2019   GLUCOSE 195 (H) 10/28/2019   CHOL 155 08/05/2019   TRIG 269 (H) 08/05/2019   HDL 41 (L) 08/05/2019   LDLCALC 79 08/05/2019   ALT 21 09/19/2019   AST 25 09/19/2019   NA 140 10/28/2019   K 4.9 10/28/2019   CL 107 10/28/2019   CREATININE 1.68 (H) 10/28/2019   BUN 34 (H) 10/28/2019   CO2 25 10/28/2019   TSH 0.83 10/28/2019   INR 0.9 10/09/2019   HGBA1C 8.8 (A) 03/09/2020       Problem List: 4.2 cm ascending aortic aneurysm Hypertension with significant left ventricular hypertrophy asymmetric hypertrophy of the basal septum chronic kidney disease concern for  obstructive sleep apnea   Assessment / Plan:   69 year old female is currently being surveilled for a 4.2 cm ascending aortic aneurysm.  I personally reviewed her CT scan and there is no significant interval growth since her last scan over a year ago.  She does continue to remain hypertensive despite being on appropriate medication.  She was previously evaluated by Dr. Oval Linsey for some chest pain as well as her high blood pressure and has seen Dr. Carlis Abbott for evaluation of sleep apnea which her evaluation was not completed.  This potentially could be a reason for her refractory high blood pressure.  We discussed the importance of treating her blood blood pressure to prevent any progression of her ascending aortic aneurysm, and she seemed agreeable to complete the test.  I will speak with Dr. Carlis Abbott along with Dr. Oval Linsey and Dr. Birdie Riddle to give them an update from this visit.  I will see her back in 1 year for continued surveillance of her ascending aortic aneurysm.      Lajuana Matte 05/29/2020 11:54 AM

## 2020-06-02 DIAGNOSIS — E1159 Type 2 diabetes mellitus with other circulatory complications: Secondary | ICD-10-CM | POA: Diagnosis not present

## 2020-06-02 DIAGNOSIS — E559 Vitamin D deficiency, unspecified: Secondary | ICD-10-CM | POA: Diagnosis not present

## 2020-06-02 DIAGNOSIS — E890 Postprocedural hypothyroidism: Secondary | ICD-10-CM | POA: Diagnosis not present

## 2020-06-02 LAB — COMPREHENSIVE METABOLIC PANEL
AG Ratio: 1.2 (calc) (ref 1.0–2.5)
ALT: 11 U/L (ref 6–29)
AST: 14 U/L (ref 10–35)
Albumin: 4.1 g/dL (ref 3.6–5.1)
Alkaline phosphatase (APISO): 63 U/L (ref 37–153)
BUN/Creatinine Ratio: 19 (calc) (ref 6–22)
BUN: 39 mg/dL — ABNORMAL HIGH (ref 7–25)
CO2: 25 mmol/L (ref 20–32)
Calcium: 10.9 mg/dL — ABNORMAL HIGH (ref 8.6–10.4)
Chloride: 106 mmol/L (ref 98–110)
Creat: 2.01 mg/dL — ABNORMAL HIGH (ref 0.60–0.93)
Globulin: 3.5 g/dL (calc) (ref 1.9–3.7)
Glucose, Bld: 159 mg/dL — ABNORMAL HIGH (ref 65–99)
Potassium: 4.3 mmol/L (ref 3.5–5.3)
Sodium: 139 mmol/L (ref 135–146)
Total Bilirubin: 0.3 mg/dL (ref 0.2–1.2)
Total Protein: 7.6 g/dL (ref 6.1–8.1)

## 2020-06-02 LAB — LIPID PANEL
Cholesterol: 133 mg/dL (ref <200)
HDL: 41 mg/dL — ABNORMAL LOW (ref >=50)
LDL Cholesterol (Calc): 60 mg/dL (calc)
Non-HDL Cholesterol (Calc): 92 mg/dL (calc) (ref <130)
Total CHOL/HDL Ratio: 3.2 (calc) (ref <5.0)
Triglycerides: 275 mg/dL — ABNORMAL HIGH (ref <150)

## 2020-06-02 LAB — TSH: TSH: 1.35 mIU/L (ref 0.40–4.50)

## 2020-06-02 LAB — VITAMIN D 25 HYDROXY (VIT D DEFICIENCY, FRACTURES): Vit D, 25-Hydroxy: 15 ng/mL — ABNORMAL LOW (ref 30–100)

## 2020-06-02 LAB — T4, FREE: Free T4: 1.2 ng/dL (ref 0.8–1.8)

## 2020-06-04 ENCOUNTER — Other Ambulatory Visit: Payer: Self-pay

## 2020-06-04 ENCOUNTER — Ambulatory Visit (INDEPENDENT_AMBULATORY_CARE_PROVIDER_SITE_OTHER): Payer: Medicare HMO

## 2020-06-04 DIAGNOSIS — E1165 Type 2 diabetes mellitus with hyperglycemia: Secondary | ICD-10-CM | POA: Diagnosis not present

## 2020-06-04 DIAGNOSIS — I10 Essential (primary) hypertension: Secondary | ICD-10-CM | POA: Diagnosis not present

## 2020-06-04 DIAGNOSIS — M816 Localized osteoporosis [Lequesne]: Secondary | ICD-10-CM

## 2020-06-04 DIAGNOSIS — E118 Type 2 diabetes mellitus with unspecified complications: Secondary | ICD-10-CM

## 2020-06-04 DIAGNOSIS — K219 Gastro-esophageal reflux disease without esophagitis: Secondary | ICD-10-CM

## 2020-06-04 DIAGNOSIS — IMO0002 Reserved for concepts with insufficient information to code with codable children: Secondary | ICD-10-CM

## 2020-06-04 NOTE — Progress Notes (Signed)
Chronic Care Management Pharmacy Note  06/04/2020 Name:  [name] MRN:  [id number] DOB:  07/04/1950  Subjective: [name] is an 70 y.o. year old female who is a primary patient of Tabori, Aundra Millet, MD.  The CCM team was consulted for assistance with disease management and care coordination needs.   Engaged with patient by telephone for follow up visit in response to provider referral for pharmacy case management and/or care coordination services.   Consent to Services:  The patient was given information about Chronic Care Management services, agreed to services, and gave verbal consent prior to initiation of services.  Please see initial visit note for detailed documentation.  Patient Care Team: [name], MD as PCP - General (Family Medicine) [name], MD as PCP - Cardiology (Cardiology) Cassandria Anger, MD as Consulting Physician (Endocrinology) [name], DPM as Consulting Physician (Podiatry) [name], MD as Consulting Physician (Pulmonary Disease) [name], MD as Consulting Physician (Ophthalmology) [name], [facility] as Pharmacist (Pharmacist) Tania Ade, Tomasa Blase, RN as [place] Management  Recent office visits: 03/04/2020 (PCP): ongoing cough, ensure protonix BID.   Objective: Lab Results  Component Value Date   CREATININE 2.01 (H) 06/02/2020   BUN 39 (H) 06/02/2020   GFR 36.33 (L) 10/28/2019   GFRNONAA 21 (L) 10/19/2019   GFRAA 25 (L) 10/19/2019   NA 139 06/02/2020   K 4.3 06/02/2020   CALCIUM 10.9 (H) 06/02/2020   CO2 25 06/02/2020   Lab Results  Component Value Date/Time   HGBA1C 8.8 (A) 03/09/2020 11:08 AM   HGBA1C 6.9 (A) 12/05/2019 10:45 AM   HGBA1C 8.4 (H) 08/05/2019 03:15 PM   HGBA1C 8.9 (A) 07/03/2019 03:54 PM   HGBA1C 10.3 (H) 03/01/2019 02:07 PM   GFR 36.33 (L) 10/28/2019 01:32 PM   GFR 114.68 10/14/2010 09:16 AM   MICROALBUR 570.4 08/05/2019 03:15 PM    MICROALBUR 188.7 06/01/2018 03:25 PM    Lab Results  Component Value Date   GFR 36.33 (L) 10/28/2019   GFR 114.68 10/14/2010    Last diabetic Eye exam:  Lab Results  Component Value Date/Time   HMDIABEYEEXA Retinopathy (A) 04/20/2020 12:00 AM    Last diabetic Foot exam:  Lab Results  Component Value Date/Time   HMDIABFOOTEX yes 09/21/2009 12:00 AM    Lab Results  Component Value Date   CHOL 133 06/02/2020   HDL 41 (L) 06/02/2020   LDLCALC 60 06/02/2020   TRIG 275 (H) 06/02/2020   CHOLHDL 3.2 06/02/2020   Hepatic Function Latest Ref Rng & Units 06/02/2020 09/19/2019 08/05/2019  Total Protein 6.1 - 8.1 g/dL 7.6 7.3 6.6  Albumin 3.5 - 5.0 g/dL - 3.2(L) -  AST 10 - 35 U/L _0 ALT 6 - 29 U/L _1 Alk Phosphatase 38 - 126 U/L - 65 -  Total Bilirubin 0.2 - 1.2 mg/dL 0.3 0.3 0.3  Bilirubin, Direct 0.0 - 0.2 mg/dL - - 0.1   Lab Results  Component Value Date/Time   TSH 1.35 06/02/2020 08:16 AM   TSH 0.83 10/28/2019 01:32 PM   FREET4 1.2 06/02/2020 08:16 AM   FREET4 1.1 03/01/2019 02:29 PM   CBC Latest Ref Rng & Units 10/28/2019 10/19/2019 10/09/2019  WBC 4.0 - 10.5 K/uL 8.9 8.0 8.7  Hemoglobin 12.0 - 15.0 g/dL 11.5(L) 10.2(L) 11.8(L)  Hematocrit 36.0 - 46.0 % 34.6(L) 33.0(L) 38.6  Platelets 150.0 - 400.0 K/uL 340.0 289 290  Lab Results  Component Value Date/Time   VD25OH 15 (L) 06/02/2020 08:16 AM   VD25OH 17 (L) 10/02/2018 11:59 AM   Clinical ASCVD:  The 10-year ASCVD risk score Mikey Bussing DC Jr., et al., 2013) is: 25.4%   Values used to calculate the score:     Age: 50 years     Sex: Female     Is Non-Hispanic African American: Yes     Diabetic: Yes     Tobacco smoker: No     Systolic Blood Pressure: 782 mmHg     Is BP treated: Yes     HDL Cholesterol: 41 mg/dL     Total Cholesterol: 133 mg/dL    Depression screen [facility] 2/9 04/29/2020 04/16/2020 03/04/2020  Decreased Interest 0 0 0  Down, Depressed, Hopeless 0 0 0  PHQ - 2 Score 0 0 0  Altered sleeping 0 0  0  Tired, decreased energy 0 0 0  Change in appetite 0 0 0  Feeling bad or failure about yourself  0 0 0  Trouble concentrating 0 0 0  Moving slowly or fidgety/restless 0 0 0  Suicidal thoughts 0 0 0  PHQ-9 Score 0 0 0  Difficult doing work/chores Not difficult at all Not difficult at all Not difficult at all  Some recent data might be hidden    Social History   Tobacco Use  Smoking Status Never Smoker  Smokeless Tobacco Never Used   BP Readings from Last 3 Encounters:  05/29/20 (!) 153/76  03/09/20 (!) 174/76  03/04/20 138/80   Pulse Readings from Last 3 Encounters:  05/29/20 66  03/09/20 69  03/04/20 61   Wt Readings from Last 3 Encounters:  05/29/20 210 lb (95.3 kg)  03/04/20 213 lb 3.2 oz (96.7 kg)  12/23/19 211 lb (95.7 kg)   Assessment/Interventions: Review of patient past medical history, allergies, medications, health status, including review of consultants reports, laboratory and other test data, was performed as part of comprehensive evaluation and provision of chronic care management services.   SDOH:  (Social Determinants of Health) assessments and interventions performed: Yes  CCM Care Plan Allergies  Allergen Reactions  . Dilaudid [Hydromorphone Hcl] Nausea And Vomiting  . Hydromorphone   . Lipitor [Atorvastatin] Other (See Comments)    Body & Muscle Aches  . Adhesive [Tape] Rash  . Ampicillin Rash and Other (See Comments)    REACTION: Rash and welts on her hands when she took it in the hospital  . Latex Itching, Dermatitis, Rash and Other (See Comments)    REACTION: Patient had a reaction to tape after surgery and they told her she was allergic to Latex   . Penicillins Rash and Other (See Comments)    Has patient had a PCN reaction causing immediate rash, facial/tongue/throat swelling, SOB or lightheadedness with hypotension: No Has patient had a PCN reaction causing severe rash involving mucus membranes or skin necrosis: No Has patient had a PCN  reaction that required hospitalization: Yes - in hospital receiving treatment Has patient had a PCN reaction occurring within the last 10 years: No If all of the above answers are "NO", then may proceed with Cephalosporin use.    Medications Reviewed Today    Reviewed by Lajuana Matte, MD (Physician) on 05/29/20 at 1203  Med List Status: <None>  Medication Order Taking? Sig Documenting Provider Last Dose Status Informant  acetaminophen (TYLENOL) 500 MG tablet [id number] Yes Take 1,000 mg by mouth every 6 (six) hours as needed for  moderate pain or headache. [provider] Taking Active Self  Alcohol Swabs (B-D SINGLE USE SWABS REGULAR) PADS [id number] Yes Use as directed. [name], MD Taking Active Self  amLODipine (NORVASC) 10 MG tablet [id number] Yes TAKE 1 TABLET BY MOUTH EVERY DAY  Patient taking differently: Take 10 mg by mouth daily.   Susy Frizzle, MD Taking Active   aspirin 81 MG tablet [id number] Yes Take 81 mg by mouth daily. [provider] Taking Active Self  Blood Glucose Calibration (TRUE METRIX LEVEL 2) Normal SOLN [id number] Yes Use as directed Susy Frizzle, MD Taking Active Self  Blood Glucose Monitoring Suppl (TRUE METRIX AIR GLUCOSE METER) w/Device KIT [id number] Yes 1 kit by Does not apply route 3 (three) times daily. [name], MD Taking Active Self  carvedilol (COREG) 6.25 MG tablet [id number] Yes Take 1 tablet (6.25 mg total) by mouth 2 (two) times daily with a meal. [name], MD Taking Active Self           Med Note (HOOKS, DAVIUS C   Sat Oct 19, 2019  4:03 AM) Pt takes upon waking in the morning and in the evening at supper. No specified time.  clotrimazole-betamethasone (LOTRISONE) cream [id number] Yes  [provider] Taking Active   Continuous Blood Gluc Sensor (FREESTYLE LIBRE [place]) MISC [id number] Yes Inject 1 each into the skin every 14 (fourteen) days. Use as directed. Brita Romp, NP  Taking Active   Continuous Blood Gluc Transmit (DEXCOM G6 TRANSMITTER) MISC [id number] Yes 1 Piece by Does not apply route as directed. Cassandria Anger, MD Taking Active   ferrous gluconate (FERGON) 324 MG tablet [id number] Yes TAKE 1 TABLET (324 MG TOTAL) BY MOUTH 2 (TWO) TIMES DAILY WITH A MEAL. Julian Hy, DO Taking Active   furosemide (LASIX) 40 MG tablet [id number] Yes TAKE 1 TABLET (40 MG TOTAL) BY MOUTH DAILY AS NEEDED (LEG SWELLING).  Patient taking differently: Take 40 mg by mouth daily as needed (leg swelling). Patient taking 23m daily   PSusy Frizzle MD Taking Active Self  glipiZIDE (GLUCOTROL XL) 5 MG 24 hr tablet [id number] TAKE 1 TABLET BY MOUTH EVERY DAY WITH BREAKFAST Nida, GMarella Chimes MD Taking Active   glucose blood (TRUE METRIX BLOOD GLUCOSE TEST) test strip [id number] Use as instructed TMidge Minium MD Taking Active Self  hydrALAZINE (APRESOLINE) 50 MG tablet [id number] Patient taking 235m3x/day [provider] Taking Active Self  insulin NPH-regular Human (70-30) 100 UNIT/ML injection [id number] Inject 40 Units into the skin See admin instructions. 40 units with breakfast and 40 units with supper for BG above 90 [provider] Taking Active Self           Med Note ([facility] Point[facility]ISA A   Wed Oct 09, 2019  7:05 AM) Took 40 units  levothyroxine (SYNTHROID) 100 MCG tablet [id number] Take 1 tablet (100 mcg total) by mouth daily. TaMidge MiniumMD Taking Active Self  losartan (COZAAR) 100 MG tablet [id number]   Patient not taking: Reported on 05/29/2020   [provider] Not Taking Active            Med Note (BMaryruth EveJESSICA L   Fri Dec 13, 2019 10:34 AM) Pt says she takes 1/2 of a 2534mablet  losartan (COZAAR) 25 MG tablet [id number]   Patient not taking: Reported on 05/29/2020   [provider] Not Taking Active  metoCLOPramide (REGLAN) 10 MG tablet [id number] Yes Take 1 tablet (10 mg total) by  mouth 4 (four) times daily. [name], MD Taking Active Self  ondansetron [facility]) 4 MG tablet [id number] Yes Take 1 tablet (4 mg total) by mouth every 8 (eight) hours as needed for nausea or vomiting. [name], MD Taking Active   pantoprazole (PROTONIX) 40 MG tablet [id number] Yes Take 1 tablet (40 mg total) by mouth 2 (two) times daily.  Patient taking differently: Take 40 mg by mouth 2 (two) times daily as needed (acid reflux).   [name], MD Taking Active   Polyethyl Glycol-Propyl Glycol (SYSTANE) 0.4-0.3 % SOLN [id number] Yes Apply to eye. Taking 2x/day [provider] Taking Active   polyethylene glycol (MIRALAX / GLYCOLAX) packet [id number] Yes Take 17 g by mouth daily as needed for moderate constipation. [provider] Taking Active Self  rosuvastatin (CRESTOR) 20 MG tablet [id number] Yes Take 1 tablet (20 mg total) by mouth daily at 6 PM. [name], MD Taking Active Self  TRUEplus Lancets 33G [place] [id number] Yes Use as directed [name], MD Taking Active Self         Patient Active Problem List   Diagnosis Date Noted  . Localized osteoporosis without current pathological fracture 05/01/2020  . Hyperparathyroidism ([place]) 12/13/2019  . Elevated hemoglobin ([place]) 12/13/2019  . Chronic renal impairment, stage 4 (severe) ([place]) 10/28/2019  . Anemia 10/28/2019  . Physical exam 06/26/2019  . Neck pain 04/18/2019  . Ascending aortic aneurysm ([place]) 04/17/2019  . Hypoxia   . Nonspecific chest pain 04/15/2019  . Neuropathic pain of right flank 12/27/2018  . Dysphagia 12/27/2018  . Postsurgical hypothyroidism 06/11/2018  . Vitamin D deficiency 06/11/2018  . Multinodular goiter 06/11/2018  . Hypercalcemia 06/11/2018  . DM type 2 causing vascular disease ([place])   . PONV (postoperative nausea and vomiting)   . Peripheral neuropathy   . Irritable bowel syndrome   . Hypertension   . History of right mastectomy   . H/O hiatal  hernia   . GERD (gastroesophageal reflux disease)   . Family history of anesthesia complication   . Diabetes mellitus type 2, uncontrolled, with complications ([place])   . Lower extremity edema   . Chronic constipation 06/23/2015  . Morbid obesity ([place]) 06/12/2015  . Chronic diastolic heart failure ([place]) 11/17/2014  . Herniated nucleus pulposus, L2-3 right 02/04/2014    Class: Acute  . Spondylolisthesis of lumbar region 04/30/2012    Class: Chronic  . Lumbar stenosis with neurogenic claudication 04/30/2012    Class: Chronic  . SYNCOPE-CAROTID SINUS 09/16/2008  . OSTEOARTHRITIS 08/23/2006  . DDD (degenerative disc disease), cervical 08/23/2006  . [place] DISEASE, LUMBAR 08/23/2006  . Headache(784.0) 08/23/2006  . BREAST CANCER, HX OF 08/23/2006  . HYPERPARATHYROIDISM, HX OF 08/23/2006  . RENAL CALCULUS, HX OF 08/23/2006   Immunization History  Administered Date(s) Administered  . Fluad Quad(high Dose 65+) 12/13/2019  . Influenza Split 01/31/2011  . Influenza Whole 02/23/2007  . Influenza, High Dose Seasonal PF 01/10/2017  . Influenza,inj,Quad PF,6+ Mos 01/07/2014, 02/09/2016, 01/01/2018, 12/03/2018  . Influenza-Unspecified 01/10/2012  . PFIZER(Purple Top)SARS-COV-2 Vaccination 05/24/2019, 06/18/2019, 01/11/2020  . Pneumococcal Conjugate-13 05/16/2019  . Pneumococcal Polysaccharide-23 08/25/2014  . Tdap 04/20/2011    Conditions to be addressed/monitored:  Hypertension, Hyperlipidemia, Diabetes, GERD, COPD, Chronic Kidney Disease and Osteoporosis There are no care plans that you recently modified to display for this patient.   Medication Assistance: none at this time, TBD -  possible for osteoporosis agent. Patient's preferred pharmacy is:  [id number] [place], [place] [place]Phone: [phone]: [phone] [facility] [address] [place]Phone: [phone]: [phone] [facility] [place] [address]Phone: [phone]: [phone] Uses pill box? No - daughter helps with med management. We discussed: Benefits of medication synchronization, packaging and delivery as well as enhanced pharmacist oversight with [employer].. Patient decided to: Continue current medication management strategy. Patient agrees to Care Plan and Follow-up.  Current Barriers:  . Suboptimal therapeutic regimen for DM, HTN, Osteoporosis  Pharmacist Clinical Goal(s):  .[name] KitchenOver the next 365 days, patient will verbalize ability to afford treatment regimen . achieve adherence to monitoring guidelines and medication adherence to achieve therapeutic efficacy . contact provider office for questions/concerns as evidenced notation of same in electronic health record through collaboration with PharmD and provider.   Interventions: . 1:1 collaboration with TMidge Minium MD regarding development and update of comprehensive plan of care as evidenced by provider attestation and co-signature . Inter-disciplinary care team collaboration (see longitudinal plan of care) . Comprehensive medication review performed; medication list updated in electronic medical record  Hypertension (BP goal <130/80) -Not ideally controlled -Current treatment: . Losartan 100 mg once daily (needs clarification) . Amlodipine 5 mg daily (now 10 mg?-needs clarification) . Furosemide 40 mg on an as needed basis (per patient) . Carvedilol 6.25 mg twice daily  . Hydralazine 25 mg TID (might be 50 mg TID? - needs clarification) -Current home readings: reports 130s/80s at home Denies hypotensive/hypertensive symptoms -Educated on BP goals and benefits of medications for prevention of heart attack, stroke and kidney damage; Daily salt intake goal  < 2300 mg; Exercise goal of 150 minutes per week; Importance of home blood pressure monitoring; -Counseled to monitor BP at home, document, and provide log at future appointments -Counseled on diet and exercise extensively Recommended to continue current medication will clarify dosing of BP meds with Dr. UHollie Salk Hyperlipidemia: (LDL goal < 70) -Controlled -Current treatment: . Rosuvastatin 20 mg once daily -Educated on Cholesterol goals;  Benefits of statin for ASCVD risk reduction; Importance of limiting foods high in cholesterol; Exercise goal of 150 minutes per week; -Counseled on diet and exercise extensively Recommended to continue current medication  Osteoporosis / Osteopenia (Goal ensure medication safety and accessibility) -Uncontrolled  -Last DEXA Scan:  03/2020 - Osteoporosis. Long term PPI use. CKD III  -ongoing uncontrolled reflux issues- PO agents not ideal -states Dr FRoyce Macadamiahad encouraged her to not use vitamin D but this could have been confused with recommendation to avoid calcium use given kidney function  -Current treatment  . Pending benefits review: Prolia injection every 6 months.  -Recommend [phone] units of vitamin D daily. -Counseled on diet and exercise extensively Recommended to continue current medication Will review benefits and contact [employer] for update-plan to follow-up with patient next week on this.  Vitamin D issue will be clarified with Dr UHollie Salkas well.  GERD (Goal: ensure medication safety) -Controlled at present, but has not had consistent control and had not optimized BID use -Current treatment  . protonix 40 mg twice daily -Counseled on diet and exercise extensively Recommended to continue current medication  Patient Goals/Self-Care Activities . Over the  next 365 days, patient will:  - take medications as prescribed collaborate with provider on medication access solutions target a minimum of 150 minutes of moderate intensity exercise  weekly  Future Appointments  Date Time Provider [place]  06/08/2020 11:00 AM Brita Romp, NP [facility] None  06/25/2020 10:00 AM [name], MD [facility]  06/26/2020 11:00 AM Florance, Tomasa Blase, RN [facility] None  07/06/2020  2:15 PM [facility] COACH [facility]   Follow-up plan with Care Management Team: . RPH: 1 week telephone visit. Update on prolia/med assistance issues, vitamin D review BP med dosing   [name], Pharm.D., BCGP Clinical Pharmacist [facility] [place] [phone]

## 2020-06-08 ENCOUNTER — Ambulatory Visit: Payer: Medicare HMO | Admitting: Nurse Practitioner

## 2020-06-08 ENCOUNTER — Encounter: Payer: Self-pay | Admitting: Nurse Practitioner

## 2020-06-08 ENCOUNTER — Other Ambulatory Visit: Payer: Self-pay

## 2020-06-08 VITALS — BP 137/61 | HR 53 | Ht 64.0 in | Wt 214.2 lb

## 2020-06-08 DIAGNOSIS — I10 Essential (primary) hypertension: Secondary | ICD-10-CM

## 2020-06-08 DIAGNOSIS — E559 Vitamin D deficiency, unspecified: Secondary | ICD-10-CM

## 2020-06-08 DIAGNOSIS — E042 Nontoxic multinodular goiter: Secondary | ICD-10-CM

## 2020-06-08 DIAGNOSIS — Z789 Other specified health status: Secondary | ICD-10-CM

## 2020-06-08 DIAGNOSIS — E1159 Type 2 diabetes mellitus with other circulatory complications: Secondary | ICD-10-CM | POA: Diagnosis not present

## 2020-06-08 DIAGNOSIS — E890 Postprocedural hypothyroidism: Secondary | ICD-10-CM

## 2020-06-08 LAB — POCT GLYCOSYLATED HEMOGLOBIN (HGB A1C): HbA1c, POC (controlled diabetic range): 7.9 % — AB (ref 0.0–7.0)

## 2020-06-08 NOTE — Patient Instructions (Signed)
Ms. Clegg,  Thank you for taking the time to review your medications with me today.  I have included our care plan/goals in the following pages. Please review and call me at [phone] with any questions!  Thanks! Ellin Mayhew, Pharm.D., BCGP Clinical Pharmacist [facility] [place] [phone]   Medication Assistance: none at this time, TBD - possible for osteoporosis agent. Patient's preferred pharmacy is:  [id number] [place], [place] [address] [place] [address] Phone: [phone] Fax: [phone]  [facility] - [address], [place] [address] Phone: [phone] Fax: [phone]  [facility] - [place], [place] [address] [place] [address] Phone: [phone] Fax: [phone]  Uses pill box? No - daughter helps with med management. We discussed: Benefits of medication synchronization, packaging and delivery as well as enhanced pharmacist oversight with [employer].. Patient decided to: Continue current medication management strategy. Patient agrees to Care Plan and Follow-up.  Current Barriers:  . Suboptimal therapeutic regimen for DM, HTN, Osteoporosis  Pharmacist Clinical Goal(s):  [name] Kitchen Over the next 365 days, patient will verbalize ability to afford treatment regimen . achieve adherence to monitoring guidelines and medication adherence to achieve therapeutic efficacy . contact provider office for questions/concerns as evidenced notation of same in electronic health record through collaboration with PharmD and provider.   Interventions: . 1:1 collaboration with [name], MD regarding development and update of comprehensive plan of care as evidenced by provider attestation and co-signature . Inter-disciplinary care  team collaboration (see longitudinal plan of care) . Comprehensive medication review performed; medication list updated in electronic medical record  Hypertension (BP goal <130/80) -Not ideally controlled -Current treatment: . Losartan 100 mg once daily (needs clarification) . Amlodipine 5 mg daily (now 10 mg?-needs clarification) . Furosemide 40 mg on an as needed basis (per patient) . Carvedilol 6.25 mg twice daily  . Hydralazine 25 mg TID (might be 50 mg TID? - needs clarification) -Current home readings: reports 130s/80s at home Denies hypotensive/hypertensive symptoms -Educated on BP goals and benefits of medications for prevention of heart attack, stroke and kidney damage; Daily salt intake goal < 2300 mg; Exercise goal of 150 minutes per week; Importance of home blood pressure monitoring; -Counseled to monitor BP at home, document, and provide log at future appointments -Counseled on diet and exercise extensively Recommended to continue current medication will clarify dosing of BP meds with Dr. Hollie Salk  Hyperlipidemia: (LDL goal < 70) -Controlled -Current treatment: . Rosuvastatin 20 mg once daily -Educated on Cholesterol goals;  Benefits of statin for ASCVD risk reduction; Importance of limiting foods high in cholesterol; Exercise goal of 150 minutes per week; -Counseled on diet and exercise extensively Recommended to continue current medication  Osteoporosis / Osteopenia (Goal ensure medication safety and accessibility) -Uncontrolled  -Last DEXA Scan:  03/2020 - Osteoporosis. Long term PPI use. CKD III  -ongoing uncontrolled reflux issues- PO agents not ideal -states Dr Royce Macadamia had encouraged her to not use vitamin D but this could have been confused with recommendation to avoid calcium use given kidney function  -Current treatment  . Pending benefits review: Prolia injection every 6 months.  -Recommend [phone] units of vitamin D daily. -Counseled on diet and exercise  extensively Recommended to continue current medication Will review benefits and contact [employer] for  update-plan to follow-up with patient next week on this.  Vitamin D issue will be clarified with Dr Hollie Salk as well.  GERD (Goal: ensure medication safety) -Controlled at present, but has not had consistent control and had not optimized BID use -Current treatment  . protonix 40 mg twice daily -Counseled on diet and exercise extensively Recommended to continue current medication  Patient Goals/Self-Care Activities . Over the next 365 days, patient will:  - take medications as prescribed collaborate with provider on medication access solutions target a minimum of 150 minutes of moderate intensity exercise weekly   The patient verbalized understanding of instructions provided today and agreed to receive a MyChart copy of patient instruction and/or educational materials. Telephone follow up appointment with pharmacy team member scheduled for: See next appointment with "Care Management Staff" under "What's Next" below.

## 2020-06-08 NOTE — Chronic Care Management (AMB) (Signed)
  Chronic Care Management   Outreach Note   Name: [name] MRN: [id number] DOB: 10-11-1950  Spoke with [employer] asst - verification is still in process on prolia, submitted for expedited review and expecting completion 1-2 business days.   [name], Pharm.D., BCGP Clinical Pharmacist [facility] [phone]

## 2020-06-08 NOTE — Patient Instructions (Signed)

## 2020-06-08 NOTE — Progress Notes (Signed)
06/08/2020, 11:47 AM   Endocrinology follow-up note   Subjective:    Patient ID: [name], female    DOB: 1950-12-26.  [name] is being seen in follow-up for management of currently uncontrolled,complicated symptomatic type 2 diabetes and associated hypertension, hypothyroidism. PMD :  [name], MD.   Past Medical History:  Diagnosis Date  . Anginal pain ([place]) 03/19/2012   saw Dr. Cathie Olden .. she thinks its more related to stomach issues  . Arthritis    "all over; mostly in my back" (03/20/2012)  . Breast cancer ([place])    "right" (03/20/2012)  . Cardiomyopathy   . Chest pain 06/2005   Hospitalized, dystolic dysfunction  . Chronic lower back pain    "have 2 herniated discs; going to have to have a fusion" (03/20/2012)  . CKD (chronic kidney disease), stage III ([place])   . Family history of anesthesia complication    "father has nausea and vomiting"  . Gallstones   . GERD (gastroesophageal reflux disease)   . H/O hiatal hernia   . History of kidney stones   . History of right mastectomy   . Hypertension    "patient states never had HTN, takes Hyzaar for heart  . Hypothyroidism   . Irritable bowel syndrome   . Migraines    "it's been a long time since I've had one" (03/20/2012)  . Osteoarthritis   . Peripheral neuropathy   . PONV (postoperative nausea and vomiting)   . Sciatica   . Type II diabetes mellitus ([place])    Past Surgical History:  Procedure Laterality Date  . ABDOMINAL HYSTERECTOMY  1993  . adenosin cardiolite  07/2005   (+) wall motion abnormality  . AXILLARY LYMPH NODE DISSECTION  2003   squamous cell cancer  . BACK SURGERY    . CARDIAC CATHETERIZATION  ? 2005 / 2007  . CHOLECYSTECTOMY N/A 01/24/2018   Procedure: LAPAROSCOPIC CHOLECYSTECTOMY;  Surgeon: Erroll Luna, MD;  Location: [facility];  Service: General;  Laterality: N/A;  . CT abdomen and pelvis   02/2010   Same  . ESOPHAGOGASTRODUODENOSCOPY  2005   GERD  . KNEE ARTHROSCOPY  1990's   "right" (03/20/2012)  . LUMBAR FUSION N/A 08/22/2014   Procedure: Right L2-3 and right L1-2 transforaminal lumbar interbody fusion with pedicle screws, rods, sleeves, cages, local bone graft, Vivigen, cancellous chips;  Surgeon: Jessy Oto, MD;  Location: [facility];  Service: Orthopedics;  Laterality: N/A;  . LUMBAR LAMINECTOMY/DECOMPRESSION MICRODISCECTOMY N/A 02/04/2014   Procedure: RIGHT L2-3 MICRODISCECTOMY ;  Surgeon: Jessy Oto, MD;  Location: [facility];  Service: Orthopedics;  Laterality: N/A;  . MASTECTOMY Right   . MASTECTOMY WITH AXILLARY LYMPH NODE DISSECTION  12/2003   "right" (03/20/2012)  . POSTERIOR CERVICAL FUSION/FORAMINOTOMY  2001  . SHOULDER ARTHROSCOPY W/ ROTATOR CUFF REPAIR  2003   Left  . THYROIDECTOMY  1977   Right  . TOTAL KNEE ARTHROPLASTY  2000   Right  . [place] of abdomen  07/2005   Fatty liver / kidney stones   Social History   Socioeconomic History  . Marital status: Married    Spouse name: Not  on file  . Number of children: 1  . Years of education: Not on file  . Highest education level: Not on file  Occupational History  . Occupation: Retired since 2003  Tobacco Use  . Smoking status: Never Smoker  . Smokeless tobacco: Never Used  Vaping Use  . Vaping Use: Never used  Substance and Sexual Activity  . Alcohol use: No  . Drug use: No  . Sexual activity: Not on file  Other Topics Concern  . Not on file  Social History Narrative  . Not on file   Social Determinants of Health   Financial Resource Strain: Not on file  Food Insecurity: No Food Insecurity  . Worried About Charity fundraiser in the Last Year: Never true  . Ran Out of Food in the Last Year: Never true  Transportation Needs: No Transportation Needs  . Lack of Transportation (Medical): No  . Lack of Transportation (Non-Medical): No  Physical Activity: Not on file  Stress: Not on file  Social  Connections: Not on file   Outpatient Encounter Medications as of 06/08/2020  Medication Sig  . acetaminophen (TYLENOL) 500 MG tablet Take 1,000 mg by mouth every 6 (six) hours as needed for moderate pain or headache.  . Alcohol Swabs (B-D SINGLE USE SWABS REGULAR) PADS Use as directed.  [name] Kitchen amLODipine (NORVASC) 10 MG tablet TAKE 1 TABLET BY MOUTH EVERY DAY (Patient taking differently: Take 10 mg by mouth daily.)  . aspirin 81 MG tablet Take 81 mg by mouth daily.  . Blood Glucose Calibration (TRUE METRIX LEVEL 2) Normal SOLN Use as directed  . Blood Glucose Monitoring Suppl (TRUE METRIX AIR GLUCOSE METER) w/Device KIT 1 kit by Does not apply route 3 (three) times daily.  . carvedilol (COREG) 6.25 MG tablet Take 1 tablet (6.25 mg total) by mouth 2 (two) times daily with a meal.  . clotrimazole-betamethasone (LOTRISONE) cream   . Continuous Blood Gluc Sensor (FREESTYLE LIBRE 14 DAY SENSOR) MISC Inject 1 each into the skin every 14 (fourteen) days. Use as directed.  . Continuous Blood Gluc Transmit (DEXCOM G6 TRANSMITTER) MISC 1 Piece by Does not apply route as directed.  . ferrous gluconate (FERGON) 324 MG tablet TAKE 1 TABLET (324 MG TOTAL) BY MOUTH 2 (TWO) TIMES DAILY WITH A MEAL.  . furosemide (LASIX) 40 MG tablet TAKE 1 TABLET (40 MG TOTAL) BY MOUTH DAILY AS NEEDED (LEG SWELLING). (Patient taking differently: Take 40 mg by mouth 2 (two) times daily. Patient taking PRN)  . glipiZIDE (GLUCOTROL XL) 5 MG 24 hr tablet TAKE 1 TABLET BY MOUTH EVERY DAY WITH BREAKFAST  . glucose blood (TRUE METRIX BLOOD GLUCOSE TEST) test strip Use as instructed  . hydrALAZINE (APRESOLINE) 50 MG tablet Patient taking 6m 3x/day  . insulin NPH-regular Human (70-30) 100 UNIT/ML injection Inject 40 Units into the skin See admin instructions. 40 units with breakfast and 40 units with supper for BG above 90  . levothyroxine (SYNTHROID) 100 MCG tablet Take 1 tablet (100 mcg total) by mouth daily.  .[name] Kitchenlosartan (COZAAR) 100 MG  tablet   . losartan (COZAAR) 25 MG tablet   . metoCLOPramide (REGLAN) 10 MG tablet Take 1 tablet (10 mg total) by mouth 4 (four) times daily.  . ondansetron (ZOFRAN) 4 MG tablet Take 1 tablet (4 mg total) by mouth every 8 (eight) hours as needed for nausea or vomiting.  . pantoprazole (PROTONIX) 40 MG tablet Take 1 tablet (40 mg total) by mouth 2 (two)  times daily. (Patient taking differently: Take 40 mg by mouth 2 (two) times daily as needed (acid reflux).)  . Polyethyl Glycol-Propyl Glycol (SYSTANE) 0.4-0.3 % SOLN Apply to eye. Taking 2x/day  . polyethylene glycol (MIRALAX / GLYCOLAX) packet Take 17 g by mouth daily as needed for moderate constipation.  . rosuvastatin (CRESTOR) 20 MG tablet Take 1 tablet (20 mg total) by mouth daily at 6 PM.  . TRUEplus Lancets 33G MISC Use as directed   No facility-administered encounter medications on file as of 06/08/2020.    ALLERGIES: Allergies  Allergen Reactions  . Dilaudid [Hydromorphone Hcl] Nausea And Vomiting  . Hydromorphone   . Lipitor [Atorvastatin] Other (See Comments)    Body & Muscle Aches  . Adhesive [Tape] Rash  . Ampicillin Rash and Other (See Comments)    REACTION: Rash and welts on her hands when she took it in the hospital  . Latex Itching, Dermatitis, Rash and Other (See Comments)    REACTION: Patient had a reaction to tape after surgery and they told her she was allergic to Latex   . Penicillins Rash and Other (See Comments)    Has patient had a PCN reaction causing immediate rash, facial/tongue/throat swelling, SOB or lightheadedness with hypotension: No Has patient had a PCN reaction causing severe rash involving mucus membranes or skin necrosis: No Has patient had a PCN reaction that required hospitalization: Yes - in hospital receiving treatment Has patient had a PCN reaction occurring within the last 10 years: No If all of the above answers are "NO", then may proceed with Cephalosporin use.     VACCINATION  STATUS: Immunization History  Administered Date(s) Administered  . Fluad Quad(high Dose 65+) 12/13/2019  . Influenza Split 01/31/2011  . Influenza Whole 02/23/2007  . Influenza, High Dose Seasonal PF 01/10/2017  . Influenza,inj,Quad PF,6+ Mos 01/07/2014, 02/09/2016, 01/01/2018, 12/03/2018  . Influenza-Unspecified 01/10/2012  . PFIZER(Purple Top)SARS-COV-2 Vaccination 05/24/2019, 06/18/2019, 01/11/2020  . Pneumococcal Conjugate-13 05/16/2019  . Pneumococcal Polysaccharide-23 08/25/2014  . Tdap 04/20/2011    Diabetes She presents for her follow-up diabetic visit. She has type 2 diabetes mellitus. Onset time: She was diagnosed at approximate age of 72 years. Her disease course has been improving. There are no hypoglycemic associated symptoms. Pertinent negatives for hypoglycemia include no confusion, headaches, pallor, seizures or tremors. Associated symptoms include fatigue. Pertinent negatives for diabetes include no blurred vision, no chest pain, no foot ulcerations, no polydipsia, no polyphagia, no polyuria and no weight loss. There are no hypoglycemic complications. Symptoms are improving. Diabetic complications include heart disease, nephropathy and retinopathy. Risk factors for coronary artery disease include diabetes mellitus, hypertension, obesity, sedentary lifestyle, post-menopausal and dyslipidemia. Current diabetic treatment includes insulin injections and oral agent (monotherapy). She is compliant with treatment most of the time. Her weight is fluctuating minimally. She is following a generally unhealthy diet. When asked about meal planning, she reported none. She has not had a previous visit with a dietitian. She never participates in exercise. Her home blood glucose trend is decreasing steadily. Her breakfast blood glucose range is generally 110-130 mg/dl. Her bedtime blood glucose range is generally 130-140 mg/dl. (She presents today with her logs, no meter showing at goal fasting and  near goal postprandial glycemic profile.  She still does not monitor glucose optimally, and therefore misses some opportunities to inject insulin.  Her POCT A1c today is 7.9%, improving from previous visit of 8.8%.  She does not have any major episodes of hypoglycemia.  She does report decreased  appetite and often has to force herself to eat. ) An ACE inhibitor/angiotensin II receptor blocker is being taken. She does not see a podiatrist.Eye exam is current.  Hypertension This is a chronic problem. The current episode started more than 1 year ago. The problem has been gradually improving since onset. The problem is controlled. Pertinent negatives include no blurred vision, chest pain, headaches, palpitations or shortness of breath. Agents associated with hypertension include thyroid hormones. Risk factors for coronary artery disease include diabetes mellitus, obesity, sedentary lifestyle, dyslipidemia, family history and post-menopausal state. Past treatments include angiotensin blockers, calcium channel blockers, beta blockers and diuretics. The current treatment provides mild improvement. There are no compliance problems.  Hypertensive end-organ damage includes kidney disease, CAD/MI and retinopathy. Identifiable causes of hypertension include chronic renal disease, hyperparathyroidism and a thyroid problem. undergoing workup for hyperparathyroidism.  Thyroid Problem Presents for follow-up visit. Onset time: She underwent what appears to be total thyroidectomy in her 85s for reportedly benign goiter.  Has taken thyroid hormone ever since, currently levothyroxine 100 mcg p.o. nightly. Symptoms include cold intolerance, depressed mood and fatigue. Patient reports no diarrhea, heat intolerance, palpitations, tremors, weight gain or weight loss. The symptoms have been stable. Past treatments include levothyroxine. Prior procedures include thyroidectomy.     Review of systems  Constitutional: + Minimally  fluctuating body weight,  current Body mass index is 36.77 kg/m. , + fatigue, no subjective hyperthermia, + subjective hypothermia Eyes: no blurry vision, no xerophthalmia ENT: no sore throat, no nodules palpated in throat, no dysphagia/odynophagia, no hoarseness Cardiovascular: + intermittent chest pain (seeing cardiologist for this- having sleep study done soon), no shortness of breath, no palpitations, no leg swelling Respiratory: no cough, no shortness of breath Gastrointestinal: no nausea/vomiting/diarrhea Musculoskeletal: no muscle/joint aches Skin: no rashes, no hyperemia Neurological: no tremors, no numbness, no tingling, no dizziness Psychiatric: + depression, no anxiety    Objective:    BP 137/61 (BP Location: Left Arm, Patient Position: Sitting)   Pulse (!) 53   Ht '5\' 4"'  (1.626 m)   Wt 214 lb 3.2 oz (97.2 kg)   BMI 36.77 kg/m   Wt Readings from Last 3 Encounters:  06/08/20 214 lb 3.2 oz (97.2 kg)  05/29/20 210 lb (95.3 kg)  03/04/20 213 lb 3.2 oz (96.7 kg)    BP Readings from Last 3 Encounters:  06/08/20 137/61  05/29/20 (!) 153/76  03/09/20 (!) 174/76      Physical Exam- Limited  Constitutional:  Body mass index is 36.77 kg/m. , not in acute distress, normal state of mind Eyes:  EOMI, no exophthalmos Neck: Supple Cardiovascular: RRR, no murmers, rubs, or gallops, no edema Respiratory: Adequate breathing efforts, no crackles, rales, rhonchi, or wheezing Musculoskeletal: no gross deformities, strength intact in all four extremities, no gross restriction of joint movements Skin:  no rashes, no hyperemia Neurological: no tremor with outstretched hands    CMP Latest Ref Rng & Units 06/02/2020 10/28/2019 10/19/2019  Glucose 65 - 99 mg/dL 159(H) 195(H) 109(H)  BUN 7 - 25 mg/dL 39(H) 34(H) 46(H)  Creatinine 0.60 - 0.93 mg/dL 2.01(H) 1.68(H) 2.25(H)  Sodium 135 - 146 mmol/L 139 140 139  Potassium 3.5 - 5.3 mmol/L 4.3 4.9 5.1  Chloride 98 - 110 mmol/L 106 107 106   CO2 20 - 32 mmol/L '25 25 24  ' Calcium 8.6 - 10.4 mg/dL 10.9(H) 10.7(H) 10.0  Total Protein 6.1 - 8.1 g/dL 7.6 - -  Total Bilirubin 0.2 - 1.2 mg/dL 0.3 - -  Alkaline Phos 38 - 126 U/L - - -  AST 10 - 35 U/L 14 - -  ALT 6 - 29 U/L 11 - -     Diabetic Labs (most recent): Lab Results  Component Value Date   HGBA1C 7.9 (A) 06/08/2020   HGBA1C 8.8 (A) 03/09/2020   HGBA1C 6.9 (A) 12/05/2019     Lipid Panel ( most recent) Lipid Panel     Component Value Date/Time   CHOL 133 06/02/2020 0816   TRIG 275 (H) 06/02/2020 0816   HDL 41 (L) 06/02/2020 0816   CHOLHDL 3.2 06/02/2020 0816   VLDL 42 (H) 04/17/2019 0227   LDLCALC 60 06/02/2020 0816      Assessment & Plan:   1) DM type 2 causing vascular disease ([place])  - [name] has currently uncontrolled symptomatic type 2 DM since 70 years of age.  She presents today with her logs, no meter showing at goal fasting and near goal postprandial glycemic profile.  She still does not monitor glucose optimally, and therefore misses some opportunities to inject insulin.  Her POCT A1c today is 7.9%, improving from previous visit of 8.8%.  She does not have any major episodes of hypoglycemia.  She does report decreased appetite and often has to force herself to eat.  -her diabetes is complicated by retinopathy, obesity/sedentary life, and she remains at a high risk for more acute and chronic complications which include CAD, CVA, CKD, retinopathy, and neuropathy. These are all discussed in detail with her.  - Nutritional counseling repeated at each appointment due to patients tendency to fall back in to old habits.  - The patient admits there is a room for improvement in their diet and drink choices. -  Suggestion is made for the patient to avoid simple carbohydrates from their diet including Cakes, Sweet Desserts / Pastries, Ice Cream, Soda (diet and regular), Sweet Tea, Candies, Chips, Cookies, Sweet Pastries,  Store Bought Juices, Alcohol  in Excess of  1-2 drinks a day, Artificial Sweeteners, Coffee Creamer, and "Sugar-free" Products. This will help patient to have stable blood glucose profile and potentially avoid unintended weight gain.   - I encouraged the patient to switch to  unprocessed or minimally processed complex starch and increased protein intake (animal or plant source), fruits, and vegetables.   - Patient is advised to stick to a routine mealtimes to eat 3 meals  a day and avoid unnecessary snacks ( to snack only to correct hypoglycemia).  - she is following with Jearld Fenton, RDN, CDE for individualized diabetes education.  - I have approached her with the following individualized plan to manage diabetes and patient agrees:   -She will continue to benefit from her current simplified insulin treatment regimen.   -She is encouraged to stay engaged for proper monitoring and safe use of insulin (which she has struggled with in the past).  -Given her near target glycemic profile, she is advised to continue her Novolin 70/30 to 40 units with breakfast and 40 units with supper for pre-meal blood glucose readings above 90 and she is eating.  She is advised to continue Glipizide 5 mg XL daily with breakfast.  -She is encouraged to start monitoring blood glucose consistently 4 times per day, before meals and before bed and to call the clinic if she has readings less than 70 or greater than 200 for 3 tests in a row.  -She could greatly benefit from CGM.  She could not afford the copay for  Dexcom.  Sent in Rx for Freestyle libre to [facility] in [place] but she never heard from them.  - she is warned not to take insulin without proper monitoring per orders.  -She is not a candidate for Metformin due to CKD.    - Patient specific target  A1c;  LDL, HDL, Triglycerides, were discussed in detail.  2) BP/HTN:  Her blood pressure is controlled to target.  She is advised to continue Amlodipine 10 mg po daily,  Carvedilol 6.25 mg po twice daily, Lasix 40 mg po daily as needed for swelling, Hydralazine 25 mg po TID, and Losartan 125 mg po daily. Will defer any med changes to nephrologist (who she is seeing later this month).  3) Lipids/HPL:    Her most recent lipid panel from 06/02/20 shows controlled LDL of 60 and elevated triglycerides of 275.  She has intolerance to statins.  4)  Weight/Diet:  Her Body mass index is 36.77 kg/m.- clearly complicating her diabetes care.  She is a candidate for modest weight loss.  I discussed with her the fact that loss of 5 - 10% of her  current body weight will have the most impact on her diabetes management.  CDE Consult will be initiated . Exercise, and detailed carbohydrates information provided  -  detailed on discharge instructions.  5) Hypothyroidism-postsurgical Her recent thyroid function tests are consistent with appropriate hormone replacement.  She is advised to continue Levothyroxine 100 mcg po daily before breakfast.    - We discussed about the correct intake of her thyroid hormone, on empty stomach at fasting, with water, separated by at least 30 minutes from breakfast and other medications,  and separated by more than 4 hours from calcium, iron, multivitamins, acid reflux medications (PPIs). -Patient is made aware of the fact that thyroid hormone replacement is needed for life, dose to be adjusted by periodic monitoring of thyroid function tests.  6) Multinodular goiter. -Her most recent thyroid/neck ultrasound from 09/12/18 revealed 2.9 cm nodule on the left lobe of her thyroid -She has surgically absent right thyroid lobe.  Biopsy of 2.9 cm nodule on the left lobe is benign.    She will not need any surgical intervention at this time.    7) Vitamin D deficiency -Her most recent vitamin D level on 06/02/20 was 15.  She was taken off her vitamin D supplement by her nephrologist. Will recommend she follow up with her nephrologist regarding low vitamin D  levels.  8)Hypercalcemia: -Her most recent Calcium level was 10.9, steadily increasing over the span of the last year.  She has had some workup for hypercalcemia in the past but it is still incomplete.  She had a Dexa scan showing osteoporosis in 03/2020.  She does have stage 3/4 kidney disease which may be impacting her numbers, however more information is needed to rule out [place] (Familial Hypocalciuric Hypercalcemia).  I have ordered the following tests Phosphorous, Magnesium, PTH and calcium, PTH and PTH-rp, and 24 hour urine calcium and creatinine.  She does not take any calcium supplements nor consume an excessive amount of calcium containing foods.  She is also advised to follow up with her nephrologist.  Will follow up with her in 2 weeks.  9) Chronic Care/Health Maintenance: -she is on ARB and is encouraged to initiate and continue to follow up with Ophthalmology, Dentist,  Podiatrist at least yearly or according to recommendations, and advised to  stay away from smoking. I have recommended yearly flu vaccine  and pneumonia vaccine at least every 5 years; moderate intensity exercise for up to 150 minutes weekly; and  sleep for at least 7 hours a day.  - I advised patient to maintain close follow up with [name], MD for primary care needs.  - Time spent on this patient care encounter:  35 min, of which > 50% was spent in  counseling and the rest reviewing her blood glucose logs , discussing her hypoglycemia and hyperglycemia episodes, reviewing her current and  previous labs / studies  ( including abstraction from other facilities) and medications  doses and developing a  long term treatment plan and documenting her care.   Please refer to Patient Instructions for Blood Glucose Monitoring and Insulin/Medications Dosing Guide"  in media tab for additional information. Please  also refer to " Patient Self Inventory" in the Media  tab for reviewed elements of pertinent patient  history.  [name] participated in the discussions, expressed understanding, and voiced agreement with the above plans.  All questions were answered to her satisfaction. she is encouraged to contact clinic should she have any questions or concerns prior to her return visit.   Follow up plan: - Return in about 2 weeks (around 06/22/2020) for hypercalcemia, Previsit labs.  [name], [facility] [address] [place], [place] [address] Phone: [phone] Fax: [phone]  06/08/2020, 11:47 AM

## 2020-06-10 ENCOUNTER — Other Ambulatory Visit: Payer: Self-pay | Admitting: Nurse Practitioner

## 2020-06-11 ENCOUNTER — Telehealth: Payer: Self-pay

## 2020-06-11 DIAGNOSIS — M816 Localized osteoporosis [Lequesne]: Secondary | ICD-10-CM

## 2020-06-11 LAB — CALCIUM, URINE, 24 HOUR
Calcium, 24H Urine: 27 mg/24 hr (ref 0–320)
Calcium, Urine: 1.8 mg/dL

## 2020-06-11 LAB — CREATININE, URINE, 24 HOUR
Creatinine, 24H Ur: 1163 mg/24 hr (ref 800–1800)
Creatinine, Urine: 77.5 mg/dL

## 2020-06-11 NOTE — Progress Notes (Deleted)
Chronic Care Management Pharmacy Assistant   Name: [name]  MRN: [id number] DOB: [birth year]  Reason for Encounter: {Follow-Up Reason:24148}  Patient Questions:  1.  Have you seen any other providers since your last visit? {CHL THN UPSTREAM YES/NO:24149}  2.  Any changes in your medicines or health? {CHL THN UPSTREAM YES/NO:24149}   [name],  70 y.o. , female presents for their {Initial/Follow-up:3041532} CCM visit with the clinical pharmacist {CHL HP Upstream Pharm visit MEBR:[id number]}.  PCP : Desiree Minium, MD  Allergies:   Allergies  Allergen Reactions  . Dilaudid [Hydromorphone Hcl] Nausea And Vomiting  . Hydromorphone   . Lipitor [Atorvastatin] Other (See Comments)    Body & Muscle Aches  . Adhesive [Tape] Rash  . Ampicillin Rash and Other (See Comments)    REACTION: Rash and welts on her hands when she took it in the hospital  . Latex Itching, Dermatitis, Rash and Other (See Comments)    REACTION: Patient had a reaction to tape after surgery and they told her she was allergic to Latex   . Penicillins Rash and Other (See Comments)    Has patient had a PCN reaction causing immediate rash, facial/tongue/throat swelling, SOB or lightheadedness with hypotension: No Has patient had a PCN reaction causing severe rash involving mucus membranes or skin necrosis: No Has patient had a PCN reaction that required hospitalization: Yes - in hospital receiving treatment Has patient had a PCN reaction occurring within the last 10 years: No If all of the above answers are "NO", then may proceed with Cephalosporin use.     Medications: Outpatient Encounter Medications as of 06/12/2020  Medication Sig Note  . acetaminophen (TYLENOL) 500 MG tablet Take 1,000 mg by mouth every 6 (six) hours as needed for moderate pain or headache.   . Alcohol Swabs (B-D SINGLE USE SWABS REGULAR) PADS Use as directed.   [name] Kitchen amLODipine (NORVASC) 10 MG tablet TAKE 1 TABLET BY MOUTH  EVERY DAY (Patient taking differently: Take 10 mg by mouth daily.)   . aspirin 81 MG tablet Take 81 mg by mouth daily.   . Blood Glucose Calibration (TRUE METRIX LEVEL 2) Normal SOLN Use as directed   . Blood Glucose Monitoring Suppl (TRUE METRIX AIR GLUCOSE METER) w/Device KIT 1 kit by Does not apply route 3 (three) times daily.   . carvedilol (COREG) 6.25 MG tablet Take 1 tablet (6.25 mg total) by mouth 2 (two) times daily with a meal. 10/19/2019: Pt takes upon waking in the morning and in the evening at supper. No specified time.  . clotrimazole-betamethasone (LOTRISONE) cream    . Continuous Blood Gluc Sensor (FREESTYLE LIBRE 14 DAY SENSOR) MISC Inject 1 each into the skin every 14 (fourteen) days. Use as directed.   . Continuous Blood Gluc Transmit (DEXCOM G6 TRANSMITTER) MISC 1 Piece by Does not apply route as directed.   . ferrous gluconate (FERGON) 324 MG tablet TAKE 1 TABLET (324 MG TOTAL) BY MOUTH 2 (TWO) TIMES DAILY WITH A MEAL.   . furosemide (LASIX) 40 MG tablet TAKE 1 TABLET (40 MG TOTAL) BY MOUTH DAILY AS NEEDED (LEG SWELLING). (Patient taking differently: Take 40 mg by mouth 2 (two) times daily. Patient taking PRN)   . glipiZIDE (GLUCOTROL XL) 5 MG 24 hr tablet TAKE 1 TABLET BY MOUTH EVERY DAY WITH BREAKFAST   . glucose blood (TRUE METRIX BLOOD GLUCOSE TEST) test strip Use as instructed   . hydrALAZINE (APRESOLINE) 50 MG tablet Patient taking  52m 3x/day   . insulin NPH-regular Human (70-30) 100 UNIT/ML injection Inject 40 Units into the skin See admin instructions. 40 units with breakfast and 40 units with supper for BG above 90 10/09/2019: Took 40 units  . levothyroxine (SYNTHROID) 100 MCG tablet Take 1 tablet (100 mcg total) by mouth daily.   .[name] Kitchenlosartan (COZAAR) 100 MG tablet  12/13/2019: Pt says she takes 1/2 of a 269mtablet  . losartan (COZAAR) 25 MG tablet    . metoCLOPramide (REGLAN) 10 MG tablet Take 1 tablet (10 mg total) by mouth 4 (four) times daily.   . ondansetron (ZOFRAN)  4 MG tablet Take 1 tablet (4 mg total) by mouth every 8 (eight) hours as needed for nausea or vomiting.   . pantoprazole (PROTONIX) 40 MG tablet Take 1 tablet (40 mg total) by mouth 2 (two) times daily. (Patient taking differently: Take 40 mg by mouth 2 (two) times daily as needed (acid reflux).)   . Polyethyl Glycol-Propyl Glycol (SYSTANE) 0.4-0.3 % SOLN Apply to eye. Taking 2x/day   . polyethylene glycol (MIRALAX / GLYCOLAX) packet Take 17 g by mouth daily as needed for moderate constipation.   . rosuvastatin (CRESTOR) 20 MG tablet Take 1 tablet (20 mg total) by mouth daily at 6 PM.   . TRUEplus Lancets 33G MISC Use as directed    No facility-administered encounter medications on file as of 06/12/2020.    Current Diagnosis: Patient Active Problem List   Diagnosis Date Noted  . Localized osteoporosis without current pathological fracture 05/01/2020  . Hyperparathyroidism ([facility]09/06/2019  . Elevated hemoglobin (HCR[facility]09/06/2019  . Chronic renal impairment, stage 4 (severe) (HCWest Valley City07/19/2021  . Anemia 10/28/2019  . Physical exam 06/26/2019  . Neck pain 04/18/2019  . Ascending aortic aneurysm (HCSee[facility]01/09/2019  . Hypoxia   . Nonspecific chest pain 04/15/2019  . Neuropathic pain of right flank 12/27/2018  . Dysphagia 12/27/2018  . Postsurgical hypothyroidism 06/11/2018  . Vitamin D deficiency 06/11/2018  . Multinodular goiter 06/11/2018  . Hypercalcemia 06/11/2018  . DM type 2 causing vascular disease (HCJellico  . PONV (postoperative nausea and vomiting)   . Peripheral neuropathy   . Irritable bowel syndrome   . Hypertension   . History of right mastectomy   . H/O hiatal hernia   . GERD (gastroesophageal reflux disease)   . Family history of anesthesia complication   . Diabetes mellitus type 2, uncontrolled, with complications (HCPaia  . Lower extremity edema   . Chronic constipation 06/23/2015  . Morbid obesity (HCAnn[facility]03/06/2015  . Chronic diastolic heart failure (HCEureka08/11/2014  .  Herniated nucleus pulposus, L2-3 right 02/04/2014    Class: Acute  . Spondylolisthesis of lumbar region 04/30/2012    Class: Chronic  . Lumbar stenosis with neurogenic claudication 04/30/2012    Class: Chronic  . SYNCOPE-CAROTID SINUS 09/16/2008  . OSTEOARTHRITIS 08/23/2006  . DDD (degenerative disc disease), cervical 08/23/2006  . DIGrinnellISEASE, LUMBAR 08/23/2006  . Headache(784.0) 08/23/2006  . BREAST CANCER, HX OF 08/23/2006  . HYPERPARATHYROIDISM, HX OF 08/23/2006  . RENAL CALCULUS, HX OF 08/23/2006    Goals Addressed   None     Follow-Up:  {Upstream CPA Follow-up:24147}

## 2020-06-11 NOTE — Telephone Encounter (Signed)
Case # [id number] [name]  Patient has currently only met $414 of $3,900 deductible. Patient does not want to proceed with Prolia injection. Aware that once $3,900 deductible is met - injection is covered 100%.  Please advise on further care options for osteoporosis.

## 2020-06-12 ENCOUNTER — Telehealth: Payer: Medicare HMO

## 2020-06-12 ENCOUNTER — Telehealth: Payer: Self-pay

## 2020-06-12 DIAGNOSIS — M816 Localized osteoporosis [Lequesne]: Secondary | ICD-10-CM

## 2020-06-12 DIAGNOSIS — E1165 Type 2 diabetes mellitus with hyperglycemia: Secondary | ICD-10-CM

## 2020-06-12 DIAGNOSIS — IMO0002 Reserved for concepts with insufficient information to code with codable children: Secondary | ICD-10-CM

## 2020-06-12 NOTE — Telephone Encounter (Addendum)
  Chronic Care Management   Outreach Note   Name: [name] MRN: [id number] DOB: [birth year]  Spoke with [employer] regarding patient assistance on prolia- stated pt would not qualify based on insurance. [place] funding is currently closed and would only account for $1000 of $3900 deductible (pt currently at $414).   Contacted CGM supplier [facility] - previous freestyle cgm order closed due to not being in network with insurance. Insurance is still the same since this case was closed so cgm would not be covered.   Spoke with DME supplier [employer]- pt's [employer] h[employer] plan is in network. Would be subject 20% copay with Freestyle libre 2 cgm - messaged endocrinology.   [name], Pharm.D., BCGP Clinical Pharmacist [facility] [phone]

## 2020-06-15 ENCOUNTER — Other Ambulatory Visit: Payer: Self-pay | Admitting: Nurse Practitioner

## 2020-06-15 DIAGNOSIS — F4542 Pain disorder with related psychological factors: Secondary | ICD-10-CM | POA: Diagnosis not present

## 2020-06-15 DIAGNOSIS — G894 Chronic pain syndrome: Secondary | ICD-10-CM | POA: Diagnosis not present

## 2020-06-15 LAB — PTH, INTACT AND CALCIUM
Calcium: 10.2 mg/dL (ref 8.7–10.3)
PTH: 69 pg/mL — ABNORMAL HIGH (ref 15–65)

## 2020-06-15 LAB — PHOSPHORUS: Phosphorus: 3.1 mg/dL (ref 3.0–4.3)

## 2020-06-15 LAB — MAGNESIUM: Magnesium: 2 mg/dL (ref 1.6–2.3)

## 2020-06-15 LAB — PTH-RELATED PEPTIDE: PTH-related peptide: <2 pmol/L

## 2020-06-15 MED ORDER — FREESTYLE LIBRE 14 DAY SENSOR MISC: 1.0000 | 2 refills | Status: DC

## 2020-06-15 NOTE — Telephone Encounter (Signed)
Since she is unable to do the Prolia based on cost, we could restart Fosamax 70mg  weekly as it looks like it has been 5 yrs since she has been on this.  #12, 3 refills

## 2020-06-17 MED ORDER — ALENDRONATE SODIUM 70 MG PO TABS
70.0000 mg | ORAL_TABLET | ORAL | 3 refills | Status: DC
Start: 2020-06-17 — End: 2020-12-02

## 2020-06-17 NOTE — Telephone Encounter (Addendum)
Spoke with patient and she is agreeable with restarting the Fosamax weekly for her bones. Advised to let us know if she has any stomach upset from the medication. Rx sent to the [facility] pharmacy

## 2020-06-17 NOTE — Addendum Note (Signed)
Addended by: Leonidas Romberg on: 06/17/2020 04:26 PM   Modules accepted: Orders

## 2020-06-19 ENCOUNTER — Telehealth: Payer: Medicare HMO

## 2020-06-19 NOTE — Telephone Encounter (Signed)
Patient was contacted by [facility] but declined moving forward with CGM due to them stating pt will need more than 2 inj/day. I was under the impression CMN had changed so will try to further look into. Patient uncertain about moving forward unless approval is guaranteed.   Has not picked up alendronate- sent to pharmacy, planning to pick up today.  -previously tolerated -06/10/20: Ca/Phos/PTH 10.2/3.1/69.  -06/02/20: 25ohd 15

## 2020-06-19 NOTE — Telephone Encounter (Signed)
Patient was contacted by [facility] but declined moving forward with CGM due to them stating pt will need more than 2 inj/day

## 2020-06-19 NOTE — Telephone Encounter (Signed)
Spoke with [facility] medical- stated patient had incomplete profile and they were needing additional information from both patient and provider. Contact/insurance information provided, [employer] next step is to send request to provider and reach out to patient to complete profile and payment.

## 2020-06-22 ENCOUNTER — Other Ambulatory Visit: Payer: Self-pay

## 2020-06-22 ENCOUNTER — Encounter: Payer: Self-pay | Admitting: Nurse Practitioner

## 2020-06-22 ENCOUNTER — Ambulatory Visit: Payer: Medicare HMO | Admitting: Nurse Practitioner

## 2020-06-22 DIAGNOSIS — E890 Postprocedural hypothyroidism: Secondary | ICD-10-CM

## 2020-06-22 DIAGNOSIS — E042 Nontoxic multinodular goiter: Secondary | ICD-10-CM

## 2020-06-22 DIAGNOSIS — E559 Vitamin D deficiency, unspecified: Secondary | ICD-10-CM

## 2020-06-22 NOTE — Patient Instructions (Signed)
Hypercalcemia Hypercalcemia is when the level of calcium in a person's blood is above normal. The body needs calcium to make bones and keep them strong. Calcium also helps the muscles, nerves, brain, and heart work the way they should. Most of the calcium in the body is in the bones. There is also some calcium in the blood. Hypercalcemia can happen when calcium comes out of the bones, or when the kidneys are not able to remove calcium from the blood. Hypercalcemia can be mild or severe. What are the causes? There are many possible causes of hypercalcemia. Common causes of this condition include:  Hyperparathyroidism. This is a condition in which the body produces too much parathyroid hormone. There are four parathyroid glands in your neck. These glands produce a chemical messenger (hormone) that helps the body absorb calcium from foods and helps your bones release calcium.  Certain kinds of cancer. Less common causes of hypercalcemia include:  Getting too much calcium or vitamin D from your diet.  Kidney failure.  Hyperthyroidism.  Severe dehydration.  Being on bed rest or being inactive for a long time.  Certain medicines.  Infections. What increases the risk? You are more likely to develop this condition if you:  Are female.  Are 60 years of age or older.  Have a family history of hypercalcemia. What are the signs or symptoms? Mild hypercalcemia that starts slowly may not cause symptoms. Severe, sudden hypercalcemia is more likely to cause symptoms, such as:  Being more thirsty than usual.  Needing to urinate more often than usual.  Abdominal pain.  Nausea and vomiting.  Constipation.  Muscle pain, twitching, or weakness.  Feeling very tired. How is this diagnosed? Hypercalcemia is usually diagnosed with a blood test. You may also have tests to help determine what is causing this condition, such as imaging tests and more blood tests.   How is this  treated? Treatment for hypercalcemia depends on the cause. Treatment may include:  Receiving fluids through an IV.  Medicines that: ? Keep calcium levels steady after receiving fluids (loop diuretics). ? Keep calcium in your bones (bisphosphonates). ? Lower the calcium level in your blood.  Surgery to remove overactive parathyroid glands.  A procedure that filters your blood to correct calcium levels (hemodialysis). Follow these instructions at home:  Take over-the-counter and prescription medicines only as told by your health care provider.  Follow instructions from your health care provider about eating or drinking restrictions.  Drink enough fluid to keep your urine pale yellow.  Stay active. Weight-bearing exercise helps to keep calcium in your bones. Follow instructions from your health care provider about what type and level of exercise is safe for you.  Keep all follow-up visits as told by your health care provider. This is important.   Contact a health care provider if you have:  A fever.  A heartbeat that is irregular or very fast.  Changes in mood, memory, or personality. Get help right away if you:  Have severe abdominal pain.  Have chest pain.  Have trouble breathing.  Become very confused and sleepy.  Lose consciousness. Summary  Hypercalcemia is when the level of calcium in a person's blood is above normal. The body needs calcium to make bones and keep them strong. Calcium also helps the muscles, nerves, brain, and heart work the way they should.  There are many possible causes of hypercalcemia, and treatment depends on the cause.  Take over-the-counter and prescription medicines only as told by your   health care provider.  Follow instructions from your health care provider about eating or drinking restrictions. This information is not intended to replace advice given to you by your health care provider. Make sure you discuss any questions you have with  your health care provider. Document Revised: 04/24/2018 Document Reviewed: 01/01/2018 Elsevier Patient Education  2021 [employer]  

## 2020-06-22 NOTE — Progress Notes (Signed)
Endocrinology Follow Up Note       06/22/2020, 11:37 AM  [name] is a 70 y.o.-year-old female, referred by her  [name], MD  , for evaluation for hypercalcemia/hyperparathyroidism.   Past Medical History:  Diagnosis Date  . Anginal pain ([place]) 03/19/2012   saw Dr. Cathie Olden .. she thinks its more related to stomach issues  . Arthritis    "all over; mostly in my back" (03/20/2012)  . Breast cancer ([place])    "right" (03/20/2012)  . Cardiomyopathy   . Chest pain 06/2005   Hospitalized, dystolic dysfunction  . Chronic lower back pain    "have 2 herniated discs; going to have to have a fusion" (03/20/2012)  . CKD (chronic kidney disease), stage III ([place])   . Family history of anesthesia complication    "father has nausea and vomiting"  . Gallstones   . GERD (gastroesophageal reflux disease)   . H/O hiatal hernia   . History of kidney stones   . History of right mastectomy   . Hypertension    "patient states never had HTN, takes Hyzaar for heart  . Hypothyroidism   . Irritable bowel syndrome   . Migraines    "it's been a long time since I've had one" (03/20/2012)  . Osteoarthritis   . Peripheral neuropathy   . PONV (postoperative nausea and vomiting)   . Sciatica   . Type II diabetes mellitus ([place])     Past Surgical History:  Procedure Laterality Date  . ABDOMINAL HYSTERECTOMY  1993  . adenosin cardiolite  07/2005   (+) wall motion abnormality  . AXILLARY LYMPH NODE DISSECTION  2003   squamous cell cancer  . BACK SURGERY    . CARDIAC CATHETERIZATION  ? 2005 / 2007  . CHOLECYSTECTOMY N/A 01/24/2018   Procedure: LAPAROSCOPIC CHOLECYSTECTOMY;  Surgeon: Erroll Luna, MD;  Location: [facility];  Service: General;  Laterality: N/A;  . CT abdomen and pelvis  02/2010   Same  . ESOPHAGOGASTRODUODENOSCOPY  2005   GERD  . KNEE ARTHROSCOPY  1990's   "right" (03/20/2012)  . LUMBAR FUSION N/A 08/22/2014    Procedure: Right L2-3 and right L1-2 transforaminal lumbar interbody fusion with pedicle screws, rods, sleeves, cages, local bone graft, Vivigen, cancellous chips;  Surgeon: Jessy Oto, MD;  Location: [facility];  Service: Orthopedics;  Laterality: N/A;  . LUMBAR LAMINECTOMY/DECOMPRESSION MICRODISCECTOMY N/A 02/04/2014   Procedure: RIGHT L2-3 MICRODISCECTOMY ;  Surgeon: Jessy Oto, MD;  Location: [facility];  Service: Orthopedics;  Laterality: N/A;  . MASTECTOMY Right   . MASTECTOMY WITH AXILLARY LYMPH NODE DISSECTION  12/2003   "right" (03/20/2012)  . POSTERIOR CERVICAL FUSION/FORAMINOTOMY  2001  . SHOULDER ARTHROSCOPY W/ ROTATOR CUFF REPAIR  2003   Left  . THYROIDECTOMY  1977   Right  . TOTAL KNEE ARTHROPLASTY  2000   Right  . [place] of abdomen  07/2005   Fatty liver / kidney stones    Social History   Tobacco Use  . Smoking status: Never Smoker  . Smokeless tobacco: Never Used  Vaping Use  . Vaping Use: Never used  Substance Use Topics  .  Alcohol use: No  . Drug use: No    Family History  Problem Relation Age of Onset  . Hypertension Mother   . Breast cancer Mother 28  . Diabetes Father   . Hypertension Father   . Hypertension Sister   . Hypertension Brother   . Hypertension Brother   . Diabetes Brother   . Diabetes Brother   . Diabetes Other        Family Hx. of Diabetes  . Hypertension Other        Family History of HTN  . Deep vein thrombosis Daughter   . Breast cancer Maternal Aunt   . Colon cancer Neg Hx   . Esophageal cancer Neg Hx   . Liver cancer Neg Hx   . Rectal cancer Neg Hx   . Stomach cancer Neg Hx     Outpatient Encounter Medications as of 06/22/2020  Medication Sig  . acetaminophen (TYLENOL) 500 MG tablet Take 1,000 mg by mouth every 6 (six) hours as needed for moderate pain or headache.  . Alcohol Swabs (B-D SINGLE USE SWABS REGULAR) PADS Use as directed.  [name] Kitchen alendronate (FOSAMAX) 70 MG tablet Take 1 tablet (70 mg total) by mouth once a week. Take  with a full glass of water on an empty stomach.  [name] Kitchen amLODipine (NORVASC) 10 MG tablet TAKE 1 TABLET BY MOUTH EVERY DAY (Patient taking differently: Take 10 mg by mouth daily.)  . aspirin 81 MG tablet Take 81 mg by mouth daily.  . Blood Glucose Calibration (TRUE METRIX LEVEL 2) Normal SOLN Use as directed  . Blood Glucose Monitoring Suppl (TRUE METRIX AIR GLUCOSE METER) w/Device KIT 1 kit by Does not apply route 3 (three) times daily.  . carvedilol (COREG) 6.25 MG tablet Take 1 tablet (6.25 mg total) by mouth 2 (two) times daily with a meal.  . clotrimazole-betamethasone (LOTRISONE) cream   . Continuous Blood Gluc Sensor (FREESTYLE LIBRE 14 DAY SENSOR) MISC Inject 1 each into the skin every 14 (fourteen) days. Use as directed.  . Continuous Blood Gluc Transmit (DEXCOM G6 TRANSMITTER) MISC 1 Piece by Does not apply route as directed.  . ferrous gluconate (FERGON) 324 MG tablet TAKE 1 TABLET (324 MG TOTAL) BY MOUTH 2 (TWO) TIMES DAILY WITH A MEAL.  . furosemide (LASIX) 40 MG tablet TAKE 1 TABLET (40 MG TOTAL) BY MOUTH DAILY AS NEEDED (LEG SWELLING). (Patient taking differently: Take 40 mg by mouth 2 (two) times daily. Patient taking PRN)  . glipiZIDE (GLUCOTROL XL) 5 MG 24 hr tablet TAKE 1 TABLET BY MOUTH EVERY DAY WITH BREAKFAST  . glucose blood (TRUE METRIX BLOOD GLUCOSE TEST) test strip Use as instructed  . hydrALAZINE (APRESOLINE) 50 MG tablet Patient taking 74m 3x/day  . insulin NPH-regular Human (70-30) 100 UNIT/ML injection Inject 40 Units into the skin See admin instructions. 40 units with breakfast and 40 units with supper for BG above 90  . levothyroxine (SYNTHROID) 100 MCG tablet Take 1 tablet (100 mcg total) by mouth daily.  .[name] Kitchenlosartan (COZAAR) 100 MG tablet   . losartan (COZAAR) 25 MG tablet   . metoCLOPramide (REGLAN) 10 MG tablet Take 1 tablet (10 mg total) by mouth 4 (four) times daily.  . ondansetron (ZOFRAN) 4 MG tablet Take 1 tablet (4 mg total) by mouth every 8 (eight) hours as  needed for nausea or vomiting.  . pantoprazole (PROTONIX) 40 MG tablet Take 1 tablet (40 mg total) by mouth 2 (two) times daily. (Patient taking differently: Take  40 mg by mouth 2 (two) times daily as needed (acid reflux).)  . Polyethyl Glycol-Propyl Glycol (SYSTANE) 0.4-0.3 % SOLN Apply to eye. Taking 2x/day  . polyethylene glycol (MIRALAX / GLYCOLAX) packet Take 17 g by mouth daily as needed for moderate constipation.  . rosuvastatin (CRESTOR) 20 MG tablet Take 1 tablet (20 mg total) by mouth daily at 6 PM.  . TRUEplus Lancets 33G MISC Use as directed   No facility-administered encounter medications on file as of 06/22/2020.    Allergies  Allergen Reactions  . Dilaudid [Hydromorphone Hcl] Nausea And Vomiting  . Hydromorphone   . Lipitor [Atorvastatin] Other (See Comments)    Body & Muscle Aches  . Adhesive [Tape] Rash  . Ampicillin Rash and Other (See Comments)    REACTION: Rash and welts on her hands when she took it in the hospital  . Latex Itching, Dermatitis, Rash and Other (See Comments)    REACTION: Patient had a reaction to tape after surgery and they told her she was allergic to Latex   . Penicillins Rash and Other (See Comments)    Has patient had a PCN reaction causing immediate rash, facial/tongue/throat swelling, SOB or lightheadedness with hypotension: No Has patient had a PCN reaction causing severe rash involving mucus membranes or skin necrosis: No Has patient had a PCN reaction that required hospitalization: Yes - in hospital receiving treatment Has patient had a PCN reaction occurring within the last 10 years: No If all of the above answers are "NO", then may proceed with Cephalosporin use.      HPI  TYRINA HINES was diagnosed with hypercalcemia in 2013 (in and out of normal range since then).  Patient has no previously known history of parathyroid, pituitary, adrenal dysfunctions; no family history of such dysfunctions. -Review of her most recent labs  reveals calcium of 10.9 the corresponding PTH of 77 on 09/19/2019.  I reviewed pt's DEXA scans: 03/2020 T-score of -2.5 (osteoporotic)  No prior history of fragility fractures or falls. No history of kidney stones.  + history of CKD. Last BUN/Cr: 39/2.01  she is not on HCTZ or other thiazide therapy.  + history of vitamin D deficiency. Last vitamin D level was 15 in 06/02/20 (she was prescribed vitamin D supplement, but then subsequently taken off by her nephrologist).  she is not on calcium supplements,  she eats dairy and green, leafy, vegetables on average amounts.  she does not have a family history of hypercalcemia, pituitary tumors, thyroid cancer, or osteoporosis.   I reviewed her chart and she also has a history of multinodular goiter s/p right hemithyroidectomy, FNA of left nodule.   Her most recent Calcium level was 10.9, steadily increasing over the span of the last year.  She has had some workup for hypercalcemia in the past but it is still incomplete.  She had a Dexa scan showing osteoporosis in 03/2020.  She does have stage 3/4 kidney disease which may be impacting her numbers.  ROS:  Constitutional: no weight gain/loss, + fatigue, no subjective hyperthermia, + subjective hypothermia Eyes: no blurry vision, no xerophthalmia ENT: no sore throat, no nodules palpated in throat, + intermittent dysphagia/odynophagia, + intermittent hoarseness Cardiovascular: + intermittent Chest Pain (saw cardiologist for this), no Shortness of Breath, no palpitations, + leg swelling and pain in right calf Respiratory: no cough, no shortness of breath  Gastrointestinal: no Nausea/Vomiting/Diarrhea Musculoskeletal: + back pain (undergoing workup for spinal stimulator) Skin: no rashes Neurological: no tremors, no numbness, no tingling,  no dizziness Psychiatric: no depression, no  anxiety ------------------------------------------------------------------------------------------------------------------------------- OBJECTIVE:  BP (!) 152/72 (BP Location: Left Arm, Patient Position: Sitting)   Pulse (!) 53   Ht _0  (1.626 m)   Wt 216 lb (98 kg)   BMI 37.08 kg/m , Body mass index is 37.08 kg/m. Wt Readings from Last 3 Encounters:  06/22/20 216 lb (98 kg)  06/08/20 214 lb 3.2 oz (97.2 kg)  05/29/20 210 lb (95.3 kg)     Physical Exam- Limited  Constitutional:  Body mass index is 37.08 kg/m. , not in acute distress, normal state of mind Eyes:  EOMI, no exophthalmos Neck: Supple Thyroid: + left side palpable, hx of right thyroidectomy Cardiovascular: RRR, no murmers, rubs, or gallops, + 2+ pitting edema to BLE, vascular changes to right lower calf, warm to touch Respiratory: Adequate breathing efforts, no crackles, rales, rhonchi, or wheezing Skin:  no rashes, no hyperemia Neurological: no tremor with outstretched hands  CMP ( most recent) CMP     Component Value Date/Time   NA 139 06/02/2020 0816   NA 135 08/14/2009 1400   K 4.3 06/02/2020 0816   K 4.1 08/14/2009 1400   CL 106 06/02/2020 0816   CL 99 08/14/2009 1400   CO2 25 06/02/2020 0816   CO2 27 08/14/2009 1400   GLUCOSE 159 (H) 06/02/2020 0816   GLUCOSE 235 (H) 08/14/2009 1400   BUN 39 (H) 06/02/2020 0816   BUN 11 08/14/2009 1400   CREATININE 2.01 (H) 06/02/2020 0816   CALCIUM 10.2 06/10/2020 1534   CALCIUM 10.5 (H) 09/19/2019 1420   PROT 7.6 06/02/2020 0816   PROT 7.1 08/14/2009 1400   ALBUMIN 3.2 (L) 09/19/2019 1420   ALBUMIN 3.9 08/14/2009 1400   AST 14 06/02/2020 0816   AST 25 09/19/2019 1420   ALT 11 06/02/2020 0816   ALT 21 09/19/2019 1420   ALT 32 08/14/2009 1400   ALKPHOS 65 09/19/2019 1420   ALKPHOS 65 08/14/2009 1400   BILITOT 0.3 06/02/2020 0816   BILITOT 0.3 09/19/2019 1420   GFRNONAA 21 (L) 10/19/2019 0330   GFRNONAA 31 (L) 09/19/2019 1420   GFRNONAA 38 (L)  03/01/2019 1407   GFRAA 25 (L) 10/19/2019 0330   GFRAA 36 (L) 09/19/2019 1420   GFRAA 44 (L) 03/01/2019 1407     Diabetic Labs (most recent): Lab Results  Component Value Date   HGBA1C 7.9 (A) 06/08/2020   HGBA1C 8.8 (A) 03/09/2020   HGBA1C 6.9 (A) 12/05/2019     Lipid Panel ( most recent) Lipid Panel     Component Value Date/Time   CHOL 133 06/02/2020 0816   TRIG 275 (H) 06/02/2020 0816   HDL 41 (L) 06/02/2020 0816   CHOLHDL 3.2 06/02/2020 0816   VLDL 42 (H) 04/17/2019 0227   LDLCALC 60 06/02/2020 0816      Lab Results  Component Value Date   TSH 1.35 06/02/2020   TSH 0.83 10/28/2019   TSH 1.18 08/05/2019   TSH 2.13 03/01/2019   TSH 1.31 10/02/2018   TSH 1.37 06/01/2018   TSH 0.31 (L) 02/05/2018   TSH 2.23 12/19/2016   TSH 2.31 06/30/2016   TSH 2.78 05/27/2015   FREET4 1.2 06/02/2020   FREET4 1.1 03/01/2019   FREET4 1.0 10/02/2018   FREET4 1.1 06/01/2018   FREET4 1.3 02/06/2018   FREET4 1.1 12/19/2016   FREET4 1.0 05/27/2015    Results for LUVIA, ORZECHOWSKI (MRN [id number]) as of 06/22/2020 10:57  Ref. Range 06/10/2020 15:34  PTH,  Intact Latest Ref Range: 15 - 65 pg/mL 69 (H)  PTH-related peptide Latest Units: pmol/L <2.0  PTH Interp Unknown Comment    Results for ISATU, MACINNES (MRN [id number]) as of 06/22/2020 10:57  Ref. Range 06/10/2020 15:42  Creatinine, Urine Latest Ref Range: Not Estab. mg/dL 77.5  Calcium, Urine Latest Ref Range: Not Estab. mg/dL 1.8  Calcium, 24H Urine Latest Ref Range: 0 - 320 mg/24 hr 27  Creatinine, 24H Ur Latest Ref Range: 800 - 1,800 mg/24 hr 1,163   Results for LEITH, HEDLUND (MRN [id number]) as of 06/22/2020 10:57  Ref. Range 10/28/2019 13:32 06/02/2020 08:16 06/10/2020 15:34  Calcium Latest Ref Range: 8.7 - 10.3 mg/dL 10.7 (H) 10.9 (H) 10.2     Assessment / PLAN:  1. Hypercalcemia / Hyperparathyroidism  Patient has had several instances of elevated calcium, with the highest level being at 11.4 mg/dL. A corresponding  intact PTH level was also high, at 85.  - Patient also has vitamin D deficiency,  with the last level being 15.  - No apparent complications from hypercalcemia/hyperparathyroidism: no history of  nephrolithiasis, + osteoporosis, no fragility fractures. No abdominal pain, no major mood disorders, no bone pain.  - I discussed with the patient about the physiology of calcium and parathyroid hormone, and possible  effects of  increased PTH/ Calcium , including kidney stones, cardiac dysrhythmias, osteoporosis, abdominal pain, etc.   Based on her additional testing including repeat blood work and 24-hr urine collection, it appears she may have mild secondary hyperparathyroidism due to CKD.  Her repeat Ca and PTH levels have improved without intervention on recent labs. Her PTH-rp levels did not suggest malignancy, her 24-hr urine did not suggest [place].  She has follow up with her PCP on Thursday this week and her nephrologist next week.    2. Multinodular Goiter-  Given her complaint of new, intermittent difficulty swallowing and throat pain, will order repeat thyroid ultrasound given her history of right thyroidectomy and left thyroid nodule requiring biopsy.   -She is advised to follow up with her PCP regarding right calf pain and follow up with nephrologist about secondary hyperparathyroidism due to CKD.     - Time spent on this patient care encounter:  60 minutes of which 50% was spent in counseling and the rest reviewing  her current and  previous labs / studies and medications  doses and developing a plan for long term care, and documenting this care. [name]  participated in the discussions, expressed understanding, and voiced agreement with the above plans.  All questions were answered to her satisfaction. she is encouraged to contact clinic should she have any questions or concerns prior to her return visit..    FOLLOW UP PLAN: - Return in about 3 months (around 09/22/2020) for  Diabetes follow up, Thyroid follow up, Previsit labs.   [name], [facility] [address] [place], [place] [address] Phone: [phone] Fax: [phone]     06/22/2020, 11:37 AM

## 2020-06-24 ENCOUNTER — Ambulatory Visit (INDEPENDENT_AMBULATORY_CARE_PROVIDER_SITE_OTHER): Payer: Medicare HMO | Admitting: Pulmonary Disease

## 2020-06-24 ENCOUNTER — Other Ambulatory Visit: Payer: Self-pay

## 2020-06-24 ENCOUNTER — Encounter: Payer: Self-pay | Admitting: Pulmonary Disease

## 2020-06-24 VITALS — BP 132/74 | HR 56 | Temp 97.6°F | Ht 64.0 in | Wt 216.8 lb

## 2020-06-24 DIAGNOSIS — R0902 Hypoxemia: Secondary | ICD-10-CM | POA: Diagnosis not present

## 2020-06-24 DIAGNOSIS — G4733 Obstructive sleep apnea (adult) (pediatric): Secondary | ICD-10-CM | POA: Diagnosis not present

## 2020-06-24 NOTE — Addendum Note (Signed)
Addended by: Suzzanne Cloud E on: 06/24/2020 12:13 PM   Modules accepted: Orders

## 2020-06-24 NOTE — Addendum Note (Signed)
Addended by: Suzzanne Cloud E on: 06/24/2020 12:14 PM   Modules accepted: Orders

## 2020-06-24 NOTE — Progress Notes (Signed)
[name]    [id number]    [birth year]  Primary Care Physician:Tabori, Aundra Millet, MD  Referring Physician: [name], [address],  [place] [address]  Chief complaint:   Patient seen for obstructive sleep apnea  HPI:  Patient was diagnosed with obstructive sleep apnea in 2021 She is being evaluated for aortic aneurysm and for the concern of poorly controlled hypertension, was referred for further evaluation and management of sleep apnea  She states she does not have any problems sleeping Does not wake up feeling tired  Usually goes to bed between 1030 and 1130 Takes about 30 minutes to fall asleep about 1 awakening Wakes up in the morning about 8:30 AM  Denies any significant symptoms  She does have some chest discomfort  Has a history of hypertension, diabetes, hypercholesterolemia, chronic kidney disease  Epworth Sleepiness Scale of 1   Outpatient Encounter Medications as of 06/24/2020  Medication Sig  . acetaminophen (TYLENOL) 500 MG tablet Take 1,000 mg by mouth every 6 (six) hours as needed for moderate pain or headache.  . Alcohol Swabs (B-D SINGLE USE SWABS REGULAR) PADS Use as directed.  [name] Kitchen alendronate (FOSAMAX) 70 MG tablet Take 1 tablet (70 mg total) by mouth once a week. Take with a full glass of water on an empty stomach.  [name] Kitchen amLODipine (NORVASC) 10 MG tablet TAKE 1 TABLET BY MOUTH EVERY DAY (Patient taking differently: Take 10 mg by mouth daily.)  . aspirin 81 MG tablet Take 81 mg by mouth daily.  . Blood Glucose Calibration (TRUE METRIX LEVEL 2) Normal SOLN Use as directed  . Blood Glucose Monitoring Suppl (TRUE METRIX AIR GLUCOSE METER) w/Device KIT 1 kit by Does not apply route 3 (three) times daily.  . carvedilol (COREG) 6.25 MG tablet Take 1 tablet (6.25 mg total) by mouth 2 (two) times daily with a meal.  . clotrimazole-betamethasone (LOTRISONE) cream   . Continuous Blood Gluc Sensor (FREESTYLE LIBRE 14 DAY  SENSOR) MISC Inject 1 each into the skin every 14 (fourteen) days. Use as directed.  . Continuous Blood Gluc Transmit (DEXCOM G6 TRANSMITTER) MISC 1 Piece by Does not apply route as directed.  . ferrous gluconate (FERGON) 324 MG tablet TAKE 1 TABLET (324 MG TOTAL) BY MOUTH 2 (TWO) TIMES DAILY WITH A MEAL.  . furosemide (LASIX) 40 MG tablet TAKE 1 TABLET (40 MG TOTAL) BY MOUTH DAILY AS NEEDED (LEG SWELLING). (Patient taking differently: Take 40 mg by mouth 2 (two) times daily. Patient taking PRN)  . glipiZIDE (GLUCOTROL XL) 5 MG 24 hr tablet TAKE 1 TABLET BY MOUTH EVERY DAY WITH BREAKFAST  . glucose blood (TRUE METRIX BLOOD GLUCOSE TEST) test strip Use as instructed  . hydrALAZINE (APRESOLINE) 50 MG tablet Patient taking 40m 3x/day  . insulin NPH-regular Human (70-30) 100 UNIT/ML injection Inject 40 Units into the skin See admin instructions. 40 units with breakfast and 40 units with supper for BG above 90  . levothyroxine (SYNTHROID) 100 MCG tablet Take 1 tablet (100 mcg total) by mouth daily.  .[name] Kitchenlosartan (COZAAR) 100 MG tablet   . losartan (COZAAR) 25 MG tablet   . metoCLOPramide (REGLAN) 10 MG tablet Take 1 tablet (10 mg total) by mouth 4 (four) times daily.  . ondansetron (ZOFRAN) 4 MG tablet Take 1 tablet (4 mg total) by mouth every 8 (eight) hours as needed for nausea or vomiting.  . pantoprazole (PROTONIX) 40 MG tablet  Take 1 tablet (40 mg total) by mouth 2 (two) times daily. (Patient taking differently: Take 40 mg by mouth 2 (two) times daily as needed (acid reflux).)  . Polyethyl Glycol-Propyl Glycol (SYSTANE) 0.4-0.3 % SOLN Apply to eye. Taking 2x/day  . polyethylene glycol (MIRALAX / GLYCOLAX) packet Take 17 g by mouth daily as needed for moderate constipation.  . rosuvastatin (CRESTOR) 20 MG tablet Take 1 tablet (20 mg total) by mouth daily at 6 PM.  . TRUEplus Lancets 33G MISC Use as directed   No facility-administered encounter medications on file as of 06/24/2020.    Allergies as  of 06/24/2020 - Review Complete 06/24/2020  Allergen Reaction Noted  . Dilaudid [hydromorphone hcl] Nausea And Vomiting 02/08/2012  . Hydromorphone  09/13/2019  . Lipitor [atorvastatin] Other (See Comments) 10/27/2015  . Adhesive [tape] Rash 08/18/2014  . Ampicillin Rash and Other (See Comments)   . Latex Itching, Dermatitis, Rash, and Other (See Comments)   . Penicillins Rash and Other (See Comments) 09/20/2012    Past Medical History:  Diagnosis Date  . Anginal pain ([facility]) 03/19/2012   saw Dr. Cathie Olden .. she thinks its more related to stomach issues  . Arthritis    "all over; mostly in my back" (03/20/2012)  . Breast cancer ([facility])    "right" (03/20/2012)  . Cardiomyopathy   . Chest pain 06/2005   Hospitalized, dystolic dysfunction  . Chronic lower back pain    "have 2 herniated discs; going to have to have a fusion" (03/20/2012)  . CKD (chronic kidney disease), stage III ([facility])   . Family history of anesthesia complication    "father has nausea and vomiting"  . Gallstones   . GERD (gastroesophageal reflux disease)   . H/O hiatal hernia   . History of kidney stones   . History of right mastectomy   . Hypertension    "patient states never had HTN, takes Hyzaar for heart  . Hypothyroidism   . Irritable bowel syndrome   . Migraines    "it's been a long time since I've had one" (03/20/2012)  . Osteoarthritis   . Peripheral neuropathy   . PONV (postoperative nausea and vomiting)   . Sciatica   . Type II diabetes mellitus ([facility])     Past Surgical History:  Procedure Laterality Date  . ABDOMINAL HYSTERECTOMY  1993  . adenosin cardiolite  07/2005   (+) wall motion abnormality  . AXILLARY LYMPH NODE DISSECTION  2003   squamous cell cancer  . BACK SURGERY    . CARDIAC CATHETERIZATION  ? 2005 / 2007  . CHOLECYSTECTOMY N/A 01/24/2018   Procedure: LAPAROSCOPIC CHOLECYSTECTOMY;  Surgeon: Erroll Luna, MD;  Location: [facility];  Service: General;  Laterality: N/A;  . CT abdomen and  pelvis  02/2010   Same  . ESOPHAGOGASTRODUODENOSCOPY  2005   GERD  . KNEE ARTHROSCOPY  1990's   "right" (03/20/2012)  . LUMBAR FUSION N/A 08/22/2014   Procedure: Right L2-3 and right L1-2 transforaminal lumbar interbody fusion with pedicle screws, rods, sleeves, cages, local bone graft, Vivigen, cancellous chips;  Surgeon: Jessy Oto, MD;  Location: [facility];  Service: Orthopedics;  Laterality: N/A;  . LUMBAR LAMINECTOMY/DECOMPRESSION MICRODISCECTOMY N/A 02/04/2014   Procedure: RIGHT L2-3 MICRODISCECTOMY ;  Surgeon: Jessy Oto, MD;  Location: [facility];  Service: Orthopedics;  Laterality: N/A;  . MASTECTOMY Right   . MASTECTOMY WITH AXILLARY LYMPH NODE DISSECTION  12/2003   "right" (03/20/2012)  . POSTERIOR CERVICAL FUSION/FORAMINOTOMY  2001  .  SHOULDER ARTHROSCOPY W/ ROTATOR CUFF REPAIR  2003   Left  . THYROIDECTOMY  1977   Right  . TOTAL KNEE ARTHROPLASTY  2000   Right  . [place] of abdomen  07/2005   Fatty liver / kidney stones    Family History  Problem Relation Age of Onset  . Hypertension Mother   . Breast cancer Mother 32  . Diabetes Father   . Hypertension Father   . Hypertension Sister   . Hypertension Brother   . Hypertension Brother   . Diabetes Brother   . Diabetes Brother   . Diabetes Other        Family Hx. of Diabetes  . Hypertension Other        Family History of HTN  . Deep vein thrombosis Daughter   . Breast cancer Maternal Aunt   . Colon cancer Neg Hx   . Esophageal cancer Neg Hx   . Liver cancer Neg Hx   . Rectal cancer Neg Hx   . Stomach cancer Neg Hx     Social History   Socioeconomic History  . Marital status: Married    Spouse name: Not on file  . Number of children: 1  . Years of education: Not on file  . Highest education level: Not on file  Occupational History  . Occupation: Retired since 2003  Tobacco Use  . Smoking status: Never Smoker  . Smokeless tobacco: Never Used  Vaping Use  . Vaping Use: Never used  Substance and Sexual  Activity  . Alcohol use: No  . Drug use: No  . Sexual activity: Not on file  Other Topics Concern  . Not on file  Social History Narrative  . Not on file   Social Determinants of Health   Financial Resource Strain: Not on file  Food Insecurity: No Food Insecurity  . Worried About Charity fundraiser in the Last Year: Never true  . Ran Out of Food in the Last Year: Never true  Transportation Needs: No Transportation Needs  . Lack of Transportation (Medical): No  . Lack of Transportation (Non-Medical): No  Physical Activity: Not on file  Stress: Not on file  Social Connections: Not on file  Intimate Partner Violence: Not on file    Review of Systems  Constitutional: Positive for fatigue.    Vitals:   06/24/20 1114  BP: 132/74  Pulse: (!) 56  Temp: 97.6 F (36.4 C)  SpO2: 95%     Physical Exam Constitutional:      Appearance: She is obese.  HENT:     Mouth/Throat:     Mouth: Mucous membranes are moist.     Comments: Mallampati 3, crowded oropharynx Eyes:     General:        Right eye: No discharge.        Left eye: No discharge.     Pupils: Pupils are equal, round, and reactive to light.  Cardiovascular:     Rate and Rhythm: Normal rate and regular rhythm.  Pulmonary:     Effort: No respiratory distress.     Breath sounds: No stridor. No wheezing or rhonchi.  Musculoskeletal:     Cervical back: No rigidity or tenderness.  Neurological:     Mental Status: She is alert.  Psychiatric:        Mood and Affect: Mood normal.    Epworth Sleepiness Scale of 1  Data Reviewed: Sleep study was reviewed with the patient showing mild obstructive sleep apnea  with oxygen desaturations  Assessment:  Patient with mild obstructive sleep apnea, does not appear to have significant symptoms but does have moderate oxygen desaturations and history of aortic aneurysm with concern for worsening if risks are not modified or adequately controlled  Obstructive sleep apnea with  overnight desaturations definitely predisposes to cardiovascular morbidity and this was discussed with the patient today  She is adamant about not having significant sleep issues -This was discussed with our and the risks of morbidity discussed as well -I did advise her that I can give her more information for her to research about the risk going forward if sleep apnea is not treated  She agreed to undergo further studies  Pathophysiology of sleep disordered breathing discussed with the patient Treatment options discussed  Plan/Recommendations: We will schedule patient for titration study  Treating the sleep apnea will help reduce cardiovascular risk  Recommend graded exercises  Tentative follow-up in 3 to 4 months   Sherrilyn Rist MD [facility] 06/24/2020, 11:49 AM  CC: [name], MD

## 2020-06-24 NOTE — Patient Outreach (Signed)
[facility]) Care Management  06/24/2020  [name] 09-05-1950 [id number]   Telephone Assessment   Successful outreach call to patient. She reports she is doing fairly well. Patient just returning home from pulmonology appt. She reports new diagnosis of sleep apnea. It has been recommended that she undergo further work up. Patient reports she goes to bed and sleeps without any issues and feels rested when she wakes up. RN CM discussed with her side effects /risks of sleep apnea and the need for proper eval and treatment. Patient states she has PCP appt on tomorrow. She has several thing she wants to discuss with MD. Encouraged pt to write down questions/cocnerns prior to visit to address with MD. She remains fairly independent with ADLs/IADLs. Supportive spouse in the home to assist her as needed. She deneis any RN CM needs or concerns at this time.    Medications Reviewed Today    Reviewed by Hayden Pedro, RN (Registered Nurse) on 06/24/20 at [facility] List Status: <None>  Medication Order Taking? Sig Documenting Provider Last Dose Status Informant  acetaminophen (TYLENOL) 500 MG tablet [id number] No Take 1,000 mg by mouth every 6 (six) hours as needed for moderate pain or headache. [provider] Taking Active Self  Alcohol Swabs (B-D SINGLE USE SWABS REGULAR) PADS [id number] No Use as directed. [name], MD Taking Active Self  alendronate (FOSAMAX) 70 MG tablet [id number] No Take 1 tablet (70 mg total) by mouth once a week. Take with a full glass of water on an empty stomach. [name], MD Taking Active   amLODipine (NORVASC) 10 MG tablet [id number] No TAKE 1 TABLET BY MOUTH EVERY DAY  Patient taking differently: Take 10 mg by mouth daily.   Susy Frizzle, MD Taking Active   aspirin 81 MG tablet [id number] No Take 81 mg by mouth daily. [provider] Taking Active Self  Blood Glucose Calibration (TRUE METRIX  LEVEL 2) Normal SOLN [id number] No Use as directed Susy Frizzle, MD Taking Active Self  Blood Glucose Monitoring Suppl (TRUE METRIX AIR GLUCOSE METER) w/Device KIT [id number] No 1 kit by Does not apply route 3 (three) times daily. [name], MD Taking Active Self  carvedilol (COREG) 6.25 MG tablet [id number] No Take 1 tablet (6.25 mg total) by mouth 2 (two) times daily with a meal. [name], MD Taking Active Self           Med Note (HOOKS, DAVIUS C   Sat Oct 19, 2019  4:03 AM) Pt takes upon waking in the morning and in the evening at supper. No specified time.  clotrimazole-betamethasone (LOTRISONE) cream [id number] No  [provider] Taking Active   Continuous Blood Gluc Sensor (FREESTYLE LIBRE 14 DAY SENSOR) MISC [id number] No Inject 1 each into the skin every 14 (fourteen) days. Use as directed. Brita Romp, NP Taking Active   Continuous Blood Gluc Transmit (DEXCOM G6 TRANSMITTER) MISC [id number] No 1 Piece by Does not apply route as directed. Cassandria Anger, MD Taking Active   ferrous gluconate (FERGON) 324 MG tablet [id number] No TAKE 1 TABLET (324 MG TOTAL) BY MOUTH 2 (TWO) TIMES DAILY WITH A MEAL. Julian Hy, DO Taking Active   furosemide (LASIX) 40 MG tablet [id number] No TAKE 1 TABLET (40 MG TOTAL) BY MOUTH DAILY AS NEEDED (LEG SWELLING).  Patient taking differently: Take 40 mg by mouth 2 (two) times daily. Patient taking PRN   Susy Frizzle,  MD Taking Active   glipiZIDE (GLUCOTROL XL) 5 MG 24 hr tablet [id number] No TAKE 1 TABLET BY MOUTH EVERY DAY WITH BREAKFAST Nida, Marella Chimes, MD Taking Active   glucose blood (TRUE METRIX BLOOD GLUCOSE TEST) test strip [id number] No Use as instructed [name], MD Taking Active Self  hydrALAZINE (APRESOLINE) 50 MG tablet [id number] No Patient taking 27m 3x/day [provider] Taking Active Self  insulin NPH-regular Human (70-30) 100 UNIT/ML injection [id number] Inject 40 Units into  the skin See admin instructions. 40 units with breakfast and 40 units with supper for BG above 90 [provider] Taking Active Self           Med Note ([facility] LISA A   Wed Oct 09, 2019  7:05 AM) Took 40 units  levothyroxine (SYNTHROID) 100 MCG tablet [id number] Take 1 tablet (100 mcg total) by mouth daily. TMidge Minium MD Taking Active Self  losartan (COZAAR) 100 MG tablet [id number]  [provider] Taking Active            Med Note (Maryruth Eve JESSICA L   Fri Dec 13, 2019 10:34 AM) Pt says she takes 1/2 of a 250mtablet  losartan (COZAAR) 25 MG tablet [id number]  [provider] Taking Active   metoCLOPramide (REGLAN) 10 MG tablet [id number] Take 1 tablet (10 mg total) by mouth 4 (four) times daily. TaMidge MiniumMD Taking Active Self  ondansetron ([facility] MG tablet [id number] Take 1 tablet (4 mg total) by mouth every 8 (eight) hours as needed for nausea or vomiting. TaMidge MiniumMD Taking Active   pantoprazole (PROTONIX) 40 MG tablet [id number] Take 1 tablet (40 mg total) by mouth 2 (two) times daily.  Patient taking differently: Take 40 mg by mouth 2 (two) times daily as needed (acid reflux).   TaMidge MiniumMD Taking Active   Polyethyl Glycol-Propyl Glycol (SYSTANE) 0.4-0.3 % SOLN [id number] Apply to eye. Taking 2x/day [provider] Taking Active   polyethylene glycol (MIRALAX / GLYCOLAX) packet [id number] Take 17 g by mouth daily as needed for moderate constipation. [provider] Taking Active Self  rosuvastatin (CRESTOR) 20 MG tablet [id number] Take 1 tablet (20 mg total) by mouth daily at 6 PM. TaMidge MiniumMD Taking Active Self  TRUEplus Lancets 33G [id number] Use as directed TaMidge MiniumMD Taking Active Self          Goals Addressed              This Visit's Progress   .  (THN)Effective Breathing Pattern During Sleep (pt-stated)         Timeframe:   Long-Range Goal Priority:  High Start Date:   06/24/2020                          Expected End Date: June 2022 Follow Up Date: June 2022     -patient will report any abnormal sleeping patterns to MD -patient will complete MD appts and further workup/testing as ordered                     Evidence-based guidance:   Use a validated screening tool to identify risk for obstructive sleep apnea (OSA).   Encourage a nonsupine sleep position, such as side-lying, head of bed elevated, multiple pillows, to prevent airway collapse.   Encourage the use of sleep  hygiene techniques.   Anticipate a referral for sleep study (polysomnography).   If OSA is identified, prepare patient for treatment options, such as nighttime oxygen therapy and CPAP (continuous positive airway pressure) or BiPAP (bilevel positive airway pressure).   Notes:   06/24/2020 Patient reports she was recently diagnosed with sleep apnea. She voices that she sleeps well at night and had no sxs. She saw pulmonologist on today for eval. She has been advised to undergo further workup/testing and complete sleep study.    .  (THN)Keep Pain Under Control        Timeframe:  Long-Range Goal Priority:  High Start Date: 02/18/2020                             Expected End Date:  June 2022                    Follow Up Date June 2022   -patient will report any worsening sxs to MD -patient will adhere to MD appt scheduled    Why is this important?   Day-to-day life can be hard when you have back pain.  Pain medicine is just one piece of the treatment puzzle. There are many things you can do to manage pain and keep your back strong.   Lifestyle changes, like stopping smoking and eating foods with Vitamin D and calcium, keep your bones and muscles healthy. Your back is better when it is supported by strong muscles.  You can try these action steps to help you manage your pain.     Notes:  04/21/20-Patient going to see another back  specialist for second opinion today 06/24/2020-Patient reports she continues to see pain MD. It has been reccommended that she do a trial back stimulator and is undergoing workup for that.     .  (THN)Monitor and Manage My Blood Sugar (pt-stated)        Timeframe:  Long-Range Goal Priority:  High Start Date:  02/18/2020                           Expected End Date:    March 2022                  Follow Up Date March2022   -patient will monitor cbgs more frequently in the home -patient will adhere to diabetic diet at least 75-100% of the time.   Why is this important?   Checking your blood sugar at home helps to keep it from getting very high or very low.  Writing the results in a diary or log helps the doctor know how to care for you.  Your blood sugar log should have the time, date and the results.  Also, write down the amount of insulin or other medicine that you take.  Other information, like what you ate, exercise done and how you were feeling, will also be helpful.     Notes:  1/11/222-Patient admits that she has Skipped" checking cbgs and taking insulin at times as she is tired of sticking herself. RN CM reinforced adherence measures.  06/24/2020-Patient reports she is trying to adhere to cbg monitoring-needs reinforcement. Last A1C on file 7.9(Feb 2022)    .  (THN)Set My Target A1C (pt-stated)        Timeframe:  Long-Range Goal Priority:  Medium Start Date:    02/18/2020  Expected End Date:  April 2022                    Follow Up Date March 2022   - set target A1C-maintain level of 7.0 or less  -patient will monitor cbgs more frequently in the home -patient will adhere to diabetic diet at least 75-100% of the time.   Why is this important?   Your target A1C is decided together by you and your doctor.  It is based on several things like your age and other health issues.    Notes:  04/21/20-Most recent A1C on file 8.8(Nov 2021) 06/24/2020-Most recent  A1C level7.9(Feb 2022)        Plan: RN CM discussed with patient next outreach within the month ofJune. Patient gave verbal consent and in agreement with RN CM follow up and timeframe. Patient aware that they may contact RN CM sooner for any issues or concerns. RN CM reviewed goals and plan of care with patient. Patient agrees to care plan and follow up. RN CM will send quarterly update to PCP.  Enzo Montgomery, RN,BSN,CCM [facility] Telephonic Care Management Coordinator Direct Phone: [phone] Toll Free: [phone] Fax: [phone]

## 2020-06-24 NOTE — Patient Instructions (Addendum)
You have mild obstructive sleep apnea with low oxygen levels at night -Even though you do not feel like you are having problems sleeping -There is low oxygen levels at night and the sleep apnea that is not treated may contribute to worsening of the aortic aneurysm that you are known to have as it makes the blood pressure more difficult to control  -Treating the sleep apnea will help modify this risk  We will schedule you for a titration study in the lab to ascertain adequate treatment of sleep apnea and ensure optimal oxygen levels at night  Graded exercises for weight loss encouraged  Call with significant concerns  I will see you back in 3 months   Sleep Apnea Sleep apnea affects breathing during sleep. It causes breathing to stop for a short time or to become shallow. It can also increase the risk of:  Heart attack.  Stroke.  Being very overweight (obese).  Diabetes.  Heart failure.  Irregular heartbeat. The goal of treatment is to help you breathe normally again. What are the causes? There are three kinds of sleep apnea:  Obstructive sleep apnea. This is caused by a blocked or collapsed airway.  Central sleep apnea. This happens when the brain does not send the right signals to the muscles that control breathing.  Mixed sleep apnea. This is a combination of obstructive and central sleep apnea. The most common cause of this condition is a collapsed or blocked airway. This can happen if:  Your throat muscles are too relaxed.  Your tongue and tonsils are too large.  You are overweight.  Your airway is too small.   What increases the risk?  Being overweight.  Smoking.  Having a small airway.  Being older.  Being female.  Drinking alcohol.  Taking medicines to calm yourself (sedatives or tranquilizers).  Having family members with the condition. What are the signs or symptoms?  Trouble staying asleep.  Being sleepy or tired during the day.  Getting  angry a lot.  Loud snoring.  Headaches in the morning.  Not being able to focus your mind (concentrate).  Forgetting things.  Less interest in sex.  Mood swings.  Personality changes.  Feelings of sadness (depression).  Waking up a lot during the night to pee (urinate).  Dry mouth.  Sore throat. How is this diagnosed?  Your medical history.  A physical exam.  A test that is done when you are sleeping (sleep study). The test is most often done in a sleep lab but may also be done at home. How is this treated?  Sleeping on your side.  Using a medicine to get rid of mucus in your nose (decongestant).  Avoiding the use of alcohol, medicines to help you relax, or certain pain medicines (narcotics).  Losing weight, if needed.  Changing your diet.  Not smoking.  Using a machine to open your airway while you sleep, such as: ? An oral appliance. This is a mouthpiece that shifts your lower jaw forward. ? A CPAP device. This device blows air through a mask when you breathe out (exhale). ? An EPAP device. This has valves that you put in each nostril. ? A BPAP device. This device blows air through a mask when you breathe in (inhale) and breathe out.  Having surgery if other treatments do not work. It is important to get treatment for sleep apnea. Without treatment, it can lead to:  High blood pressure.  Coronary artery disease.  In men, not  being able to have an erection (impotence).  Reduced thinking ability.   Follow these instructions at home: Lifestyle  Make changes that your doctor recommends.  Eat a healthy diet.  Lose weight if needed.  Avoid alcohol, medicines to help you relax, and some pain medicines.  Do not use any products that contain nicotine or tobacco, such as cigarettes, e-cigarettes, and chewing tobacco. If you need help quitting, ask your doctor. General instructions  Take over-the-counter and prescription medicines only as told by your  doctor.  If you were given a machine to use while you sleep, use it only as told by your doctor.  If you are having surgery, make sure to tell your doctor you have sleep apnea. You may need to bring your device with you.  Keep all follow-up visits as told by your doctor. This is important. Contact a doctor if:  The machine that you were given to use during sleep bothers you or does not seem to be working.  You do not get better.  You get worse. Get help right away if:  Your chest hurts.  You have trouble breathing in enough air.  You have an uncomfortable feeling in your back, arms, or stomach.  You have trouble talking.  One side of your body feels weak.  A part of your face is hanging down. These symptoms may be an emergency. Do not wait to see if the symptoms will go away. Get medical help right away. Call your local emergency services (911 in the [place] Do not drive yourself to the hospital. Summary  This condition affects breathing during sleep.  The most common cause is a collapsed or blocked airway.  The goal of treatment is to help you breathe normally while you sleep. This information is not intended to replace advice given to you by your health care provider. Make sure you discuss any questions you have with your health care provider. Document Revised: 01/12/2018 Document Reviewed: 11/21/2017 Elsevier Patient Education  [place].

## 2020-06-25 ENCOUNTER — Ambulatory Visit (INDEPENDENT_AMBULATORY_CARE_PROVIDER_SITE_OTHER): Payer: Medicare HMO | Admitting: Family Medicine

## 2020-06-25 ENCOUNTER — Encounter: Payer: Self-pay | Admitting: Family Medicine

## 2020-06-25 VITALS — BP 138/70 | HR 54 | Temp 97.2°F | Resp 20 | Ht 64.0 in | Wt 216.0 lb

## 2020-06-25 DIAGNOSIS — R600 Localized edema: Secondary | ICD-10-CM

## 2020-06-25 DIAGNOSIS — E213 Hyperparathyroidism, unspecified: Secondary | ICD-10-CM | POA: Diagnosis not present

## 2020-06-25 DIAGNOSIS — N184 Chronic kidney disease, stage 4 (severe): Secondary | ICD-10-CM | POA: Diagnosis not present

## 2020-06-25 DIAGNOSIS — Z0000 Encounter for general adult medical examination without abnormal findings: Secondary | ICD-10-CM | POA: Diagnosis not present

## 2020-06-25 DIAGNOSIS — R109 Unspecified abdominal pain: Secondary | ICD-10-CM | POA: Diagnosis not present

## 2020-06-25 DIAGNOSIS — R079 Chest pain, unspecified: Secondary | ICD-10-CM | POA: Diagnosis not present

## 2020-06-25 MED ORDER — TORSEMIDE 20 MG PO TABS: 20.0000 mg | ORAL_TABLET | Freq: Two times a day (BID) | ORAL | 3 refills | Status: DC

## 2020-06-25 NOTE — Progress Notes (Signed)
Subjective:    Patient ID: [name], female    DOB: 1951/04/01, 70 y.o.   MRN: [id number]  HPI CPE- UTD on mammogram, DEXA, foot exam, eye exam.  UTD on pneumonia vaccines, flu, COVID.  Due for colonoscopy- pt refuses.  UTD on lab work  Reviewed past medical, surgical, family and social histories.   Patient Care Team    Relationship Specialty Notifications Start End  [name], MD PCP - General Family Medicine  12/03/18   [name], MD PCP - Cardiology Cardiology  04/18/19   Cassandria Anger, MD Consulting Physician Endocrinology  12/03/18   [name], DPM Consulting Physician Podiatry  12/03/18   [name], MD Consulting Physician Pulmonary Disease  06/26/19   [name], MD Consulting Physician Ophthalmology  06/26/19   [name], [facility] Pharmacist Pharmacist Admissions 07/22/19    Comment: PHONE NUMBER [phone]  Florance, Tomasa Blase, [facility]  Admissions 10/28/19     Health Maintenance  Topic Date Due  . COLONOSCOPY (Pts 45-46yrs Insurance coverage will need to be confirmed)  06/25/2020 (Originally 06/09/2013)  . Hepatitis C Screening  12/12/2020 (Originally 25-Apr-1950)  . COVID-19 Vaccine (4 - Booster for Pfizer series) 07/11/2020  . HEMOGLOBIN A1C  12/06/2020  . FOOT EXAM  12/12/2020  . TETANUS/TDAP  04/19/2021  . OPHTHALMOLOGY EXAM  04/20/2021  . MAMMOGRAM  11/13/2021  . INFLUENZA VACCINE  Completed  . DEXA SCAN  Completed  . PNA vac Low Risk Adult  Completed  . HPV VACCINES  Aged Out      Review of Systems Patient reports no vision/ hearing changes, adenopathy,fever, weight change,  persistant/recurrent hoarseness , swallowing issues, palpitations, edema, hemoptysis, dyspnea (rest/exertional/paroxysmal nocturnal), gastrointestinal bleeding (melena, rectal bleeding), significant heartburn, bowel changes, GU symptoms (dysuria, hematuria, incontinence), Gyn symptoms (abnormal   bleeding, pain),  syncope, focal weakness, memory loss, skin/hair/nail changes, abnormal bruising or bleeding, anxiety, or depression.   + persistent evening cough + CP- 'all the time'  'I hurt from my chest to my back', 'i've been having it forever'.  Has not seen cardiology recently. + numbness/tingling of feet + L sided abd pain- pt feels a knot under the skin, TTP.  Unable to lie on that side.  'knots' have been present for 3-4 months + bilateral LE edema  This visit occurred during the SARS-CoV-2 public health emergency.  Safety protocols were in place, including screening questions prior to the visit, additional usage of staff PPE, and extensive cleaning of exam room while observing appropriate contact time as indicated for disinfecting solutions.       Objective:   Physical Exam General Appearance:    Alert, cooperative, no distress, appears stated age  Head:    Normocephalic, without obvious abnormality, atraumatic  Eyes:    PERRL, conjunctiva/corneas clear, EOM's intact, fundi    benign, both eyes  Ears:    Normal TM's and external ear canals, both ears  Nose:   Deferred due to COVID  Throat:   Neck:   Supple, symmetrical, trachea midline, no adenopathy;    Thyroid: no enlargement/tenderness/nodules  Back:     Symmetric, no curvature, ROM normal, no CVA tenderness  Lungs:     Clear to auscultation bilaterally, respirations unlabored  Chest Wall:    + tenderness, but no deformity   Heart:    Regular rate and rhythm, S1 and S2 normal  Breast Exam:    Deferred to mammo  Abdomen:     Soft, non-tender, bowel sounds active all four quadrants,    Rubbery freely mobile, TTP soft tissue masses on L  Genitalia:    Deferred  Rectal:    Extremities:   Extremities normal, atraumatic, no cyanosis.  1+ pitting edema bilaterally  Pulses:   2+ and symmetric all extremities  Skin:   Skin color, texture, turgor normal, no rashes or lesions  Lymph nodes:   Cervical, supraclavicular, and  axillary nodes normal  Neurologic:   CNII-XII intact, normal strength, sensation and reflexes    throughout          Assessment & Plan:  Chest pain- ongoing issue for pt.  Atypical but she has not seen cardiology recently and she has multiple cardiac risk factors.  Will refer to cards for complete evaluation although I suspect this is musculoskeletal and likely coming from her back.  L sided abdominal pain- new.  Pt has multiple small, freely mobile soft tissue masses that are very TTP.  Will get [place] to assess.  Pt expressed understanding and is in agreement w/ plan.

## 2020-06-25 NOTE — Patient Instructions (Addendum)
Follow up in 1 month to recheck swelling No need for labs today- YAY!!! We'll call you with your cardiology appt to check out that chest pain If the chest pain changes or worsens- go to the ER!  You deserve the best care possible! We'll call you with your ultrasound to check out the knots in your abdomen STOP the Lasix (Furosemide) START the Torsemide 20mg  twice daily for the swelling Call with any questions or concerns Hang in there!

## 2020-06-26 ENCOUNTER — Ambulatory Visit (HOSPITAL_BASED_OUTPATIENT_CLINIC_OR_DEPARTMENT_OTHER)
Admission: RE | Admit: 2020-06-26 | Discharge: 2020-06-26 | Disposition: A | Discharge status: Home / Self Care | Payer: Medicare HMO | Source: Ambulatory Visit | Attending: Nurse Practitioner | Admitting: Nurse Practitioner

## 2020-06-26 ENCOUNTER — Ambulatory Visit (HOSPITAL_BASED_OUTPATIENT_CLINIC_OR_DEPARTMENT_OTHER)
Admission: RE | Admit: 2020-06-26 | Discharge: 2020-06-26 | Disposition: A | Discharge status: Home / Self Care | Payer: Medicare HMO | Source: Ambulatory Visit | Attending: Family Medicine | Admitting: Family Medicine

## 2020-06-26 ENCOUNTER — Ambulatory Visit: Payer: Self-pay

## 2020-06-26 ENCOUNTER — Other Ambulatory Visit: Payer: Self-pay

## 2020-06-26 DIAGNOSIS — E890 Postprocedural hypothyroidism: Secondary | ICD-10-CM | POA: Insufficient documentation

## 2020-06-26 DIAGNOSIS — E042 Nontoxic multinodular goiter: Secondary | ICD-10-CM | POA: Insufficient documentation

## 2020-06-26 DIAGNOSIS — R222 Localized swelling, mass and lump, trunk: Secondary | ICD-10-CM | POA: Diagnosis not present

## 2020-06-26 DIAGNOSIS — R109 Unspecified abdominal pain: Secondary | ICD-10-CM

## 2020-07-02 ENCOUNTER — Ambulatory Visit (HOSPITAL_COMMUNITY): Payer: Medicare HMO

## 2020-07-06 ENCOUNTER — Emergency Department (HOSPITAL_COMMUNITY)
Admission: EM | Admit: 2020-07-06 | Discharge: 2020-07-06 | Disposition: A | Discharge status: Home / Self Care | Payer: Medicare HMO | Attending: Emergency Medicine | Admitting: Emergency Medicine

## 2020-07-06 ENCOUNTER — Other Ambulatory Visit: Payer: Self-pay

## 2020-07-06 ENCOUNTER — Emergency Department (HOSPITAL_COMMUNITY): Payer: Medicare HMO

## 2020-07-06 ENCOUNTER — Ambulatory Visit: Payer: Medicare HMO

## 2020-07-06 ENCOUNTER — Telehealth: Payer: Self-pay

## 2020-07-06 ENCOUNTER — Encounter (HOSPITAL_COMMUNITY): Payer: Self-pay

## 2020-07-06 DIAGNOSIS — R079 Chest pain, unspecified: Secondary | ICD-10-CM | POA: Insufficient documentation

## 2020-07-06 DIAGNOSIS — Z7982 Long term (current) use of aspirin: Secondary | ICD-10-CM | POA: Diagnosis not present

## 2020-07-06 DIAGNOSIS — I5032 Chronic diastolic (congestive) heart failure: Secondary | ICD-10-CM | POA: Diagnosis not present

## 2020-07-06 DIAGNOSIS — G894 Chronic pain syndrome: Secondary | ICD-10-CM | POA: Diagnosis not present

## 2020-07-06 DIAGNOSIS — Z79899 Other long term (current) drug therapy: Secondary | ICD-10-CM | POA: Diagnosis not present

## 2020-07-06 DIAGNOSIS — Z7984 Long term (current) use of oral hypoglycemic drugs: Secondary | ICD-10-CM | POA: Insufficient documentation

## 2020-07-06 DIAGNOSIS — R0602 Shortness of breath: Secondary | ICD-10-CM | POA: Insufficient documentation

## 2020-07-06 DIAGNOSIS — E1159 Type 2 diabetes mellitus with other circulatory complications: Secondary | ICD-10-CM | POA: Insufficient documentation

## 2020-07-06 DIAGNOSIS — R609 Edema, unspecified: Secondary | ICD-10-CM | POA: Diagnosis not present

## 2020-07-06 DIAGNOSIS — N184 Chronic kidney disease, stage 4 (severe): Secondary | ICD-10-CM | POA: Insufficient documentation

## 2020-07-06 DIAGNOSIS — I712 Thoracic aortic aneurysm, without rupture: Secondary | ICD-10-CM | POA: Diagnosis not present

## 2020-07-06 DIAGNOSIS — I130 Hypertensive heart and chronic kidney disease with heart failure and stage 1 through stage 4 chronic kidney disease, or unspecified chronic kidney disease: Secondary | ICD-10-CM | POA: Diagnosis not present

## 2020-07-06 DIAGNOSIS — Z794 Long term (current) use of insulin: Secondary | ICD-10-CM | POA: Insufficient documentation

## 2020-07-06 DIAGNOSIS — Z853 Personal history of malignant neoplasm of breast: Secondary | ICD-10-CM | POA: Insufficient documentation

## 2020-07-06 DIAGNOSIS — Z96651 Presence of right artificial knee joint: Secondary | ICD-10-CM | POA: Diagnosis not present

## 2020-07-06 DIAGNOSIS — R0789 Other chest pain: Secondary | ICD-10-CM | POA: Diagnosis not present

## 2020-07-06 DIAGNOSIS — E039 Hypothyroidism, unspecified: Secondary | ICD-10-CM | POA: Diagnosis not present

## 2020-07-06 DIAGNOSIS — Z91040 Latex allergy status: Secondary | ICD-10-CM | POA: Insufficient documentation

## 2020-07-06 LAB — CBC
HCT: 37.9 % (ref 36.0–46.0)
Hemoglobin: 12 g/dL (ref 12.0–15.0)
MCH: 28.4 pg (ref 26.0–34.0)
MCHC: 31.7 g/dL (ref 30.0–36.0)
MCV: 89.6 fL (ref 80.0–100.0)
Platelets: 291 10*3/uL (ref 150–400)
RBC: 4.23 MIL/uL (ref 3.87–5.11)
RDW: 14.6 % (ref 11.5–15.5)
WBC: 8.9 10*3/uL (ref 4.0–10.5)
nRBC: 0 % (ref 0.0–0.2)

## 2020-07-06 LAB — COMPREHENSIVE METABOLIC PANEL
ALT: 15 U/L (ref 0–44)
AST: 18 U/L (ref 15–41)
Albumin: 4 g/dL (ref 3.5–5.0)
Alkaline Phosphatase: 55 U/L (ref 38–126)
Anion gap: 11 (ref 5–15)
BUN: 44 mg/dL — ABNORMAL HIGH (ref 8–23)
CO2: 25 mmol/L (ref 22–32)
Calcium: 9.6 mg/dL (ref 8.9–10.3)
Chloride: 101 mmol/L (ref 98–111)
Creatinine, Ser: 2.05 mg/dL — ABNORMAL HIGH (ref 0.44–1.00)
GFR, Estimated: 25 mL/min — ABNORMAL LOW (ref >60)
Glucose, Bld: 349 mg/dL — ABNORMAL HIGH (ref 70–99)
Potassium: 3.5 mmol/L (ref 3.5–5.1)
Sodium: 137 mmol/L (ref 135–145)
Total Bilirubin: 0.6 mg/dL (ref 0.3–1.2)
Total Protein: 8 g/dL (ref 6.5–8.1)

## 2020-07-06 LAB — TROPONIN I (HIGH SENSITIVITY)
Troponin I (High Sensitivity): 16 ng/L (ref <18)
Troponin I (High Sensitivity): 17 ng/L (ref <18)

## 2020-07-06 MED ORDER — FENTANYL CITRATE (PF) 100 MCG/2ML IJ SOLN
50.0000 ug | Freq: Once | INTRAMUSCULAR | Status: AC
  Administered 2020-07-06: 50 ug via INTRAVENOUS
  Filled 2020-07-06: qty 2

## 2020-07-06 MED ORDER — PREDNISONE 50 MG PO TABS: 50.0000 mg | ORAL_TABLET | Freq: Every day | ORAL | 0 refills | Status: AC

## 2020-07-06 MED ORDER — NITROGLYCERIN 0.4 MG SL SUBL
0.4000 mg | SUBLINGUAL_TABLET | Freq: Once | SUBLINGUAL | Status: AC
  Administered 2020-07-06: 0.4 mg via SUBLINGUAL
  Filled 2020-07-06: qty 1

## 2020-07-06 MED ORDER — ALUM & MAG HYDROXIDE-SIMETH 200-200-20 MG/5ML PO SUSP
30.0000 mL | Freq: Once | ORAL | Status: DC
  Filled 2020-07-06: qty 30

## 2020-07-06 NOTE — Discharge Instructions (Addendum)
Your comprehensive work-up here in the ED was unremarkable.  I do believe that it is important that you follow-up with cardiology closely.  I have placed ambulatory referral.  They should give you a phone call in the next few days.  If you not hear from them, please call the number listed.  I also would like for you to follow-up with your primary care provider as well as the spine [facility] to see if your ongoing/worsening chest discomfort could perhaps be adverse effect from spinal cord stimulator placed earlier this morning.  Please take the prednisone, as directed.  Please note that this will cause elevated blood sugars.  Check your blood sugars regularly.  Return to the ED or seek immediate medical attention should you experience any new or worsening symptoms.

## 2020-07-06 NOTE — Telephone Encounter (Signed)
Called patient to complete scheduled Medicare wellness visit. She states she has been having chest pain since yesterday. She states.." It feels like 2 elephants sitting on my chest:". Patient's husband is with her. Patient advised to go to the nearest ER or call 911. Patient voiced understanding & states her husband is going to take her to the ER.

## 2020-07-06 NOTE — ED Notes (Addendum)
2 RN's attempted IV twice (each) without success, IV team ordered

## 2020-07-06 NOTE — ED Triage Notes (Signed)
Patient c/o mid chest pain since last night. Patient states "It feels like an elephant on my chest  And the pain radiates into my back." Patient states she had a nerve stimulator placed today in her back and did not tell them that she had chest pain because she says she has had back pain for 5 years and was afraid that they would not put it in if she told them about the chest pain. Patient also reports that she took her husband's NTG last night with no relief.

## 2020-07-06 NOTE — ED Notes (Signed)
IV team at bedside to attempt IV

## 2020-07-06 NOTE — Telephone Encounter (Signed)
Agree w/ advice given.  Pt needs to go to ER

## 2020-07-06 NOTE — ED Provider Notes (Signed)
[facility] DEPT Provider Note   CSN: [id number] Arrival date & time: 07/06/20  1502     History Chief Complaint  Patient presents with  . Chest Pain    [name] is a 70 y.o. female with past medical history significant for HTN, HLD, type II DM, GERD, and 4.2 4.2 cm stable ascending thoracic aortic diameter presents the ED for chest pain rating to the back described as "elephant sitting on chest".  On my examination, patient is accompanied by her husband was at bedside.  She states that last night around 1 AM she woke up from her sleep with central chest "pressure" that radiated to the middle of her upper back.  She states that it was associated with mild shortness of breath, but denied any associated nausea or emesis.  No associated diaphoresis.  She states that these episodes of chest pain have happened in the past, however she has not seen a cardiologist or had any extensive work-up done in quite some time.  She reports that despite her chest pain that has been constant and ongoing since onset at 1 AM this morning, she proceeded to [facility] where she had a spinal cord stimulator placed in her lower back.  She denies any discomfort in that area.  She states that her chest pain proceeded placement of the SCS.    She states that she did have very mild improvement with her husband's nitroglycerin that she took last evening at approximately 1 AM when she had onset of her chest pain symptoms.  HPI   Past Medical History:  Diagnosis Date  . Anginal pain ([place]) 03/19/2012   saw Dr. Cathie Olden .. she thinks its more related to stomach issues  . Arthritis    "all over; mostly in my back" (03/20/2012)  . Breast cancer ([place])    "right" (03/20/2012)  . Cardiomyopathy   . Chest pain 06/2005   Hospitalized, dystolic dysfunction  . Chronic lower back pain    "have 2 herniated discs; going to have to have a fusion" (03/20/2012)  .  CKD (chronic kidney disease), stage III ([place])   . Family history of anesthesia complication    "father has nausea and vomiting"  . Gallstones   . GERD (gastroesophageal reflux disease)   . H/O hiatal hernia   . History of kidney stones   . History of right mastectomy   . Hypertension    "patient states never had HTN, takes Hyzaar for heart  . Hypothyroidism   . Irritable bowel syndrome   . Migraines    "it's been a long time since I've had one" (03/20/2012)  . Osteoarthritis   . Peripheral neuropathy   . PONV (postoperative nausea and vomiting)   . Sciatica   . Type II diabetes mellitus [facility])     Patient Active Problem List   Diagnosis Date Noted  . Localized osteoporosis without current pathological fracture 05/01/2020  . Hyperparathyroidism ([place]) 12/13/2019  . Elevated hemoglobin ([place]) 12/13/2019  . Chronic renal impairment, stage 4 (severe) ([place]) 10/28/2019  . Anemia 10/28/2019  . Physical exam 06/26/2019  . Neck pain 04/18/2019  . Ascending aortic aneurysm ([place]) 04/17/2019  . Hypoxia   . Nonspecific chest pain 04/15/2019  . Neuropathic pain of right flank 12/27/2018  . Dysphagia 12/27/2018  . Postsurgical hypothyroidism 06/11/2018  . Vitamin D deficiency 06/11/2018  . Multinodular goiter 06/11/2018  . Hypercalcemia 06/11/2018  . DM type 2 causing vascular  disease ([place])   . PONV (postoperative nausea and vomiting)   . Peripheral neuropathy   . Irritable bowel syndrome   . Hypertension   . History of right mastectomy   . H/O hiatal hernia   . GERD (gastroesophageal reflux disease)   . Family history of anesthesia complication   . Diabetes mellitus type 2, uncontrolled, with complications ([place])   . Lower extremity edema   . Chronic constipation 06/23/2015  . Morbid obesity ([place]) 06/12/2015  . Chronic diastolic heart failure ([place]) 11/17/2014  . Herniated nucleus pulposus, L2-3 right 02/04/2014    Class: Acute  . Spondylolisthesis of lumbar region 04/30/2012     Class: Chronic  . Lumbar stenosis with neurogenic claudication 04/30/2012    Class: Chronic  . SYNCOPE-CAROTID SINUS 09/16/2008  . OSTEOARTHRITIS 08/23/2006  . DDD (degenerative disc disease), cervical 08/23/2006  . [place] DISEASE, LUMBAR 08/23/2006  . Headache(784.0) 08/23/2006  . BREAST CANCER, HX OF 08/23/2006  . HYPERPARATHYROIDISM, HX OF 08/23/2006  . RENAL CALCULUS, HX OF 08/23/2006    Past Surgical History:  Procedure Laterality Date  . ABDOMINAL HYSTERECTOMY  1993  . adenosin cardiolite  07/2005   (+) wall motion abnormality  . AXILLARY LYMPH NODE DISSECTION  2003   squamous cell cancer  . BACK SURGERY    . CARDIAC CATHETERIZATION  ? 2005 / 2007  . CHOLECYSTECTOMY N/A 01/24/2018   Procedure: LAPAROSCOPIC CHOLECYSTECTOMY;  Surgeon: Erroll Luna, MD;  Location: [facility];  Service: General;  Laterality: N/A;  . CT abdomen and pelvis  02/2010   Same  . ESOPHAGOGASTRODUODENOSCOPY  2005   GERD  . KNEE ARTHROSCOPY  1990's   "right" (03/20/2012)  . LUMBAR FUSION N/A 08/22/2014   Procedure: Right L2-3 and right L1-2 transforaminal lumbar interbody fusion with pedicle screws, rods, sleeves, cages, local bone graft, Vivigen, cancellous chips;  Surgeon: Jessy Oto, MD;  Location: [facility];  Service: Orthopedics;  Laterality: N/A;  . LUMBAR LAMINECTOMY/DECOMPRESSION MICRODISCECTOMY N/A 02/04/2014   Procedure: RIGHT L2-3 MICRODISCECTOMY ;  Surgeon: Jessy Oto, MD;  Location: [facility];  Service: Orthopedics;  Laterality: N/A;  . MASTECTOMY Right   . MASTECTOMY WITH AXILLARY LYMPH NODE DISSECTION  12/2003   "right" (03/20/2012)  . POSTERIOR CERVICAL FUSION/FORAMINOTOMY  2001  . SHOULDER ARTHROSCOPY W/ ROTATOR CUFF REPAIR  2003   Left  . THYROIDECTOMY  1977   Right  . TOTAL KNEE ARTHROPLASTY  2000   Right  . [place] of abdomen  07/2005   Fatty liver / kidney stones     OB History   No obstetric history on file.     Family History  Problem Relation Age of Onset  . Hypertension  Mother   . Breast cancer Mother 40  . Diabetes Father   . Hypertension Father   . Hypertension Sister   . Hypertension Brother   . Hypertension Brother   . Diabetes Brother   . Diabetes Brother   . Diabetes Other        Family Hx. of Diabetes  . Hypertension Other        Family History of HTN  . Deep vein thrombosis Daughter   . Breast cancer Maternal Aunt   . Colon cancer Neg Hx   . Esophageal cancer Neg Hx   . Liver cancer Neg Hx   . Rectal cancer Neg Hx   . Stomach cancer Neg Hx     Social History   Tobacco Use  . Smoking status: Never Smoker  .  Smokeless tobacco: Never Used  Vaping Use  . Vaping Use: Never used  Substance Use Topics  . Alcohol use: No  . Drug use: No    Home Medications Prior to Admission medications   Medication Sig Start Date End Date Taking? Authorizing Provider  acetaminophen (TYLENOL) 500 MG tablet Take 500-1,000 mg by mouth every 6 (six) hours as needed for moderate pain, headache or mild pain.   Yes [provider]  alendronate (FOSAMAX) 70 MG tablet Take 1 tablet (70 mg total) by mouth once a week. Take with a full glass of water on an empty stomach. Patient taking differently: Take 70 mg by mouth every Monday. Take with a full glass of water on an empty stomach. 06/17/20  Yes [name], MD  clotrimazole-betamethasone (LOTRISONE) cream Apply 1 application topically daily as needed (for irritation). 12/11/19  Yes [provider]  ferrous gluconate (FERGON) 324 MG tablet TAKE 1 TABLET (324 MG TOTAL) BY MOUTH 2 (TWO) TIMES DAILY WITH A MEAL. 10/15/19  Yes Noemi Chapel P, DO  glipiZIDE (GLUCOTROL XL) 5 MG 24 hr tablet TAKE 1 TABLET BY MOUTH EVERY DAY WITH BREAKFAST Patient taking differently: Take 5 mg by mouth daily with breakfast. 12/23/19  Yes Nida, Marella Chimes, MD  hydrALAZINE (APRESOLINE) 50 MG tablet Take 50 mg by mouth 3 (three) times daily. 10/23/19  Yes [provider]  insulin NPH-regular Human (NOVOLIN  70/30 RELION) (70-30) 100 UNIT/ML injection Inject 40 Units into the skin See admin instructions. Inject 40 units into the skin into the skin in the morning and in the evening   Yes [provider]  levothyroxine (SYNTHROID) 100 MCG tablet Take 1 tablet (100 mcg total) by mouth daily. 06/21/19  Yes [name], MD  losartan (COZAAR) 25 MG tablet Take 12.5 mg by mouth daily. 12/12/19  Yes [provider]  ondansetron (ZOFRAN) 4 MG tablet Take 1 tablet (4 mg total) by mouth every 8 (eight) hours as needed for nausea or vomiting. 01/24/20  Yes [name], MD  pantoprazole (PROTONIX) 40 MG tablet Take 1 tablet (40 mg total) by mouth 2 (two) times daily. Patient taking differently: Take 40 mg by mouth in the morning and at bedtime. 06/21/19  Yes [name], MD  polyethylene glycol (MIRALAX / GLYCOLAX) packet Take 17 g by mouth daily as needed for moderate constipation (MIX AND DRINK).   Yes [provider]  predniSONE (DELTASONE) 50 MG tablet Take 1 tablet (50 mg total) by mouth daily with breakfast for 5 days. 07/06/20 07/11/20 Yes Corena Herter, PA-C  torsemide (DEMADEX) 20 MG tablet Take 1 tablet (20 mg total) by mouth 2 (two) times daily. Patient taking differently: Take 20 mg by mouth in the morning and at bedtime. 06/25/20  Yes [name], MD  Alcohol Swabs (B-D SINGLE USE SWABS REGULAR) PADS Use as directed. 06/14/19   [name], MD  amLODipine (NORVASC) 10 MG tablet TAKE 1 TABLET BY MOUTH EVERY DAY Patient not taking: No sig reported 03/29/19   Susy Frizzle, MD  aspirin 81 MG tablet Take 81 mg by mouth daily.    [provider]  Blood Glucose Calibration (TRUE METRIX LEVEL 2) Normal SOLN Use as directed 05/17/17   Susy Frizzle, MD  Blood Glucose Monitoring Suppl (TRUE METRIX AIR GLUCOSE METER) w/Device KIT 1 kit by Does not apply route 3 (three) times daily. 06/14/19   [name], MD  carvedilol (COREG) 6.25  MG tablet Take 1 tablet (6.25 mg total) by mouth 2 (two) times daily with a meal. 06/21/19   [name], MD  clindamycin (CLEOCIN) 300 MG capsule Take 300 mg by mouth every 6 (six) hours. 07/06/20   [provider]  Continuous Blood Gluc Sensor (FREESTYLE LIBRE 14 DAY SENSOR) MISC Inject 1 each into the skin every 14 (fourteen) days. Use as directed. Patient not taking: No sig reported 06/15/20   Brita Romp, NP  Continuous Blood Gluc Transmit (DEXCOM G6 TRANSMITTER) MISC 1 Piece by Does not apply route as directed. Patient not taking: No sig reported 12/05/19   Cassandria Anger, MD  glucose blood (TRUE METRIX BLOOD GLUCOSE TEST) test strip Use as instructed 06/14/19   [name], MD  metoCLOPramide (REGLAN) 10 MG tablet Take 1 tablet (10 mg total) by mouth 4 (four) times daily. Patient not taking: Reported on 07/06/2020 08/21/19   [name], MD  rosuvastatin (CRESTOR) 20 MG tablet Take 1 tablet (20 mg total) by mouth daily at 6 PM. Patient not taking: Reported on 07/06/2020 08/21/19   [name], MD  TRUEplus Lancets 33G MISC Use as directed 06/14/19   [name], MD    Allergies    Dilaudid [hydromorphone hcl], Hydromorphone, Lipitor [atorvastatin], Adhesive [tape], Ampicillin, Latex, and Penicillins  Review of Systems   Review of Systems  All other systems reviewed and are negative.   Physical Exam Updated Vital Signs BP (!) 171/70   Pulse 66   Temp 98.2 F (36.8 C) (Oral)   Resp 17   Ht 5' 4" (1.626 m)   Wt 97.5 kg   SpO2 95%   BMI 36.90 kg/m   Physical Exam Vitals and nursing note reviewed. Exam conducted with a chaperone present.  Constitutional:      Appearance: She is not diaphoretic.     Comments: Uncomfortable.  HENT:     Head: Normocephalic and atraumatic.  Eyes:     General: No scleral icterus.    Conjunctiva/sclera: Conjunctivae normal.  Cardiovascular:     Rate and Rhythm: Normal rate and regular rhythm.      Pulses: Normal pulses.     Heart sounds: Normal heart sounds.  Pulmonary:     Effort: Pulmonary effort is normal. No respiratory distress.  Abdominal:     General: Abdomen is flat. There is no distension.     Palpations: Abdomen is soft.     Tenderness: There is no abdominal tenderness. There is no guarding.     Comments: No abdominal tenderness.  History of cholecystectomy.  Musculoskeletal:     Right lower leg: Edema present.     Left lower leg: Edema present.     Comments: 2+ pitting edema bilaterally.  Chronic.  Skin:    General: Skin is dry.  Neurological:     Mental Status: She is alert and oriented to person, place, and time.     GCS: GCS eye subscore is 4. GCS verbal subscore is 5. GCS motor subscore is 6.  Psychiatric:        Mood and Affect: Mood normal.        Behavior: Behavior normal.        Thought Content: Thought content normal.     ED Results / Procedures / Treatments   Labs (all labs ordered are listed, but only abnormal results are displayed) Labs Reviewed  COMPREHENSIVE METABOLIC PANEL - Abnormal; Notable for the following components:  Result Value   Glucose, Bld 349 (*)    BUN 44 (*)    Creatinine, Ser 2.05 (*)    GFR, Estimated 25 (*)    All other components within normal limits  CBC  TROPONIN I (HIGH SENSITIVITY)  TROPONIN I (HIGH SENSITIVITY)    EKG EKG Interpretation  Date/Time:  Monday July 06 2020 17:34:24 EDT Ventricular Rate:  61 PR Interval:    QRS Duration: 117 QT Interval:  430 QTC Calculation: 434 R Axis:   -7 Text Interpretation: Sinus rhythm Prolonged PR interval Left ventricular hypertrophy ST-t wave abnormality Poor data quality Abnormal ECG Confirmed by Carmin Muskrat [phone]) on 07/06/2020 5:53:04 PM   Radiology DG Chest 2 View  Result Date: 07/06/2020 CLINICAL DATA:  Chest pain. EXAM: CHEST - 2 VIEW COMPARISON:  April 19, 2019. FINDINGS: Stable cardiomediastinal silhouette. No pneumothorax or pleural effusion  is noted. Both lungs are clear. The visualized skeletal structures are unremarkable. IMPRESSION: No active cardiopulmonary disease. Electronically Signed   By: Marijo Conception M.D.   On: 07/06/2020 17:50   CT Chest Wo Contrast  Result Date: 07/06/2020 CLINICAL DATA:  nontraumatic central CP, history of widened ascending aorta EXAM: CT CHEST WITHOUT CONTRAST TECHNIQUE: Multidetector CT imaging of the chest was performed following the standard protocol without IV contrast. COMPARISON:  Chest CT May 29, 2020 FINDINGS: Cardiovascular: Ascending thoracic aortic measures 4.1 cm in maximum axial diameter previously 4.2 cm. Aorta at the sinuses of Valsalva measures 3.1 cm, unchanged. Maximum diameter of the aortic arch measures 3.1 cm previously 3.4 cm. Maximum diameter of the descending aorta at the main pulmonary outflow tract level measures 3.0 cm previously 3.1 cm. Scattered foci of aortic and branch vessel atherosclerosis. There are foci of coronary artery calcification. Trace amount of pericardial fluid is within physiologic range. The main pulmonary outflow tract measures 3.6 cm in diameter, unchanged. Mediastinum/Nodes: Right thyroid and isthmus are surgically absent. Left thyroid appears normal. No mediastinal or axillary adenopathy. Right axillary surgical clips. Lungs/Pleura: Small right lower lobe bulla, stable. Scattered atelectasis. Similar mosaic attenuation to prior study. No focal consolidation. No pulmonary edema. No pleural effusion. Upper Abdomen: Gallbladder surgically absent.  No acute abnormality. Musculoskeletal: Partially visualized spinal stimulator lead and lower lumbar fixation hardware. IMPRESSION: 1. No acute abnormality. 2. Unchanged ascending thoracic aortic aneurysm. Recommend annual imaging followup by CTA or MRA. This recommendation follows 2010 ACCF/AHA/AATS/ACR/ASA/SCA/SCAI/SIR/STS/SVM Guidelines for the Diagnosis and Management of Patients with Thoracic Aortic Disease.  Circulation. 2010; 121: G401-U272. Aortic aneurysm NOS (ICD10-I71.9). 3. Unchanged mosaic attenuation, which may reflect small airways disease. 4. Unchanged enlargement of the main pulmonary outflow tract, which can be seen with pulmonary arterial hypertension. 5. Aortic atherosclerosis. Aortic Atherosclerosis (ICD10-I70.0). Electronically Signed   By: Dahlia Bailiff MD   On: 07/06/2020 21:48    Procedures Procedures   Medications Ordered in ED Medications  nitroGLYCERIN (NITROSTAT) SL tablet 0.4 mg (0.4 mg Sublingual Given 07/06/20 1833)  fentaNYL (SUBLIMAZE) injection 50 mcg (50 mcg Intravenous Given 07/06/20 1954)    ED Course  I have reviewed the triage vital signs and the nursing notes.  Pertinent labs & imaging results that were available during my care of the patient were reviewed by me and considered in my medical decision making (see chart for details).    MDM Rules/Calculators/A&P HEAR Score: [place] was evaluated  in Emergency Department on 07/06/2020 for the symptoms described in the history of present illness. She was evaluated in the context of the global COVID-19 pandemic, which necessitated consideration that the patient might be at risk for infection with the SARS-CoV-2 virus that causes COVID-19. Institutional protocols and algorithms that pertain to the evaluation of patients at risk for COVID-19 are in a state of rapid change based on information released by regulatory bodies including the CDC and federal and state organizations. These policies and algorithms were followed during the patient's care in the ED.  I personally reviewed patient's medical chart and all notes from triage and staff during today's encounter. I have also ordered and reviewed all labs and imaging that I felt to be medically necessary in the evaluation of this patient's complaints and with consideration of their physical exam. If needed, translation services were  available and utilized.   Patient's chest pain woke her up in her sleep.  It has been constant, rating towards her back.  Described as pressure sensation.  Questionable relief with nitroglycerin.  She endorses history of prior.  Referral to cardiology from her primary care provider already in place.  Comprehensive work-up obtained to assess for cause of symptoms.  EKG without evidence of STEMI.  Troponin is negative, lowering concern for NSTEMI.  I have low suspicion for dissection given normal pulses in extremities and normal mediastinum on CXR.  I reviewed plain films of chest and there is no evidence of pneumothorax or consolidation concerning for pneumonia.  There is also no pleural effusion or history suggestive of possible esophageal rupture.  Patient is low risk for PE per [place] Criteria.  I discussed the patient the exact etiology of their chest pain is unclear and warrants follow up with a primary care provider. Also discussed that while their risk for ACS is low, it is not completely eliminated and I discussed with patient specific warning signs and return precautions.  Treated with nitroglycerin, no relief here in the ED.  Treated with fentanyl, still no relief.  Tried to provide Maalox, but she states that she cannot take it according to her primary care provider.  All of her work-up including CT chest without contrast was unremarkable.  Unclear cause for her nonspecific chest pain.  Abdominal exam is benign.  She is status post cholecystectomy and there is no tenderness over pancreas.  Discussed case with Dr. Vanita Panda. Perhaps it's inflammatory.  Decided to prescribe prednisone burst x 5 days with special attention paid to checking blood sugars regularly.  Will place ambulatory referral to cardiology.  Encouraging follow-up with facility who provided her with spinal cord stimulator as well as her primary care provider.  ED return precautions discussed.  Patient and husband voiced understanding  and are agreeable to the plan.   Final Clinical Impression(s) / ED Diagnoses Final diagnoses:  Nonspecific chest pain    Rx / DC Orders ED Discharge Orders         Ordered    Ambulatory referral to Cardiology        07/06/20 2239    predniSONE (DELTASONE) 50 MG tablet  Daily with breakfast        07/06/20 2239           Corena Herter, PA-C 07/06/20 2241    Carmin Muskrat, MD 07/07/20 2342

## 2020-07-07 ENCOUNTER — Telehealth: Payer: Self-pay

## 2020-07-07 ENCOUNTER — Telehealth: Payer: Self-pay | Admitting: Family Medicine

## 2020-07-07 NOTE — Telephone Encounter (Signed)
Patient has a jury summons on Aug 12, 2020 - she would like a letter excusing her from jury duty due to medical conditions.  Patient went to ED yesterday with chest pains.  They have referred her to a cardiologist.  She would like to know if a spinal cord stimulator would make it where her ekg would not work.  Please advise.

## 2020-07-07 NOTE — Telephone Encounter (Signed)
Pt states she was seen in the ER last night for chest pain, she was given predinsone 50mg  for 5 days. States her BG after breakfast this morning was 437, she had woke up late. Pt just checked her BG at 2:30 with results of 469, states she hasn't eaten since this morning. States she takes glipizide 5mg  daily and novolin 70/30 40 units 2 times daily.

## 2020-07-07 NOTE — Telephone Encounter (Signed)
Patient is requesting a call back, she said this is very important

## 2020-07-07 NOTE — Telephone Encounter (Signed)
Patient would like a letter excusing her from jury duty.

## 2020-07-07 NOTE — Telephone Encounter (Signed)
Discussed with pt, understanding voiced. 

## 2020-07-07 NOTE — Telephone Encounter (Signed)
Advise her to increase her 70/30 to 50 units BID if glucose is above 90 and she is eating for now.  She can taper back off of that dose once her glucose readings start dropping back to her normal range.

## 2020-07-08 ENCOUNTER — Telehealth: Payer: Self-pay | Admitting: Cardiology

## 2020-07-08 NOTE — Telephone Encounter (Signed)
Spoke with patient who was seen in ED Patient stated MI ruled out, told to f/u cardiology  Moved appointment to tomorrow with Dr Stanford Breed  Per patient she wakes up and as day goes on she has worsening chest pain

## 2020-07-08 NOTE — Telephone Encounter (Signed)
Pt c/o of Chest Pain: 1. Are you having CP right now? yes 2. Are you experiencing any other symptoms (ex. SOB, nausea, vomiting, sweating)? SOB 3. How long have you been experiencing CP? Last  Sunday patient to the ER 4. Is your CP continuous or coming and going? steady 5. Have you taken Nitroglycerin? Gave her those while in the hospital but they did not help

## 2020-07-09 ENCOUNTER — Encounter: Payer: Self-pay | Admitting: Family Medicine

## 2020-07-09 ENCOUNTER — Encounter: Payer: Self-pay | Admitting: Cardiology

## 2020-07-09 ENCOUNTER — Ambulatory Visit: Payer: Medicare HMO | Admitting: Cardiology

## 2020-07-09 ENCOUNTER — Other Ambulatory Visit: Payer: Self-pay

## 2020-07-09 VITALS — BP 122/60 | HR 51 | Ht 64.0 in | Wt 215.0 lb

## 2020-07-09 DIAGNOSIS — I712 Thoracic aortic aneurysm, without rupture: Secondary | ICD-10-CM

## 2020-07-09 DIAGNOSIS — R072 Precordial pain: Secondary | ICD-10-CM

## 2020-07-09 DIAGNOSIS — I7121 Aneurysm of the ascending aorta, without rupture: Secondary | ICD-10-CM

## 2020-07-09 DIAGNOSIS — I10 Essential (primary) hypertension: Secondary | ICD-10-CM | POA: Diagnosis not present

## 2020-07-09 NOTE — Assessment & Plan Note (Signed)
Ongoing issue.  Pt's BMI is 36.9 but coupled w/ her multiple medical issues this qualifies as morbidly obese.  She is not able to exercise on a regular bases so stressed the need for low carb diet.  Will follow.

## 2020-07-09 NOTE — Telephone Encounter (Signed)
Letter printed, signed, and ready to be mailed

## 2020-07-09 NOTE — Telephone Encounter (Signed)
Spoke with patient. Patient stated that her spinal cord stimulator was not turned off so maybe that is why things  were not working properly. She is going back today and will cut it off and try it again. She stated that she is also going to see Dr Stanford Breed today as well. Patient asked if you could print the letter for her and allow me to mail it to her address because it will be easier for her.

## 2020-07-09 NOTE — Assessment & Plan Note (Signed)
Pt's PE w/ multiple abnormalities- obesity, LE edema, abdominal soft tissue masses, TTP over chest wall.  UTD on mammo, DEXA, foot, exam, eye exam.  UTD on vaccines.  Declines colonoscopy.  Reviewed recent labs, no need to repeat.

## 2020-07-09 NOTE — Patient Instructions (Signed)
  Follow-Up: At [facility], you and your health needs are our priority.  As part of our continuing mission to provide you with exceptional heart care, we have created designated Provider Care Teams.  These Care Teams include your primary Cardiologist (physician) and Advanced Practice Providers (APPs -  Physician Assistants and Nurse Practitioners) who all work together to provide you with the care you need, when you need it.  We recommend signing up for the patient portal called "MyChart".  Sign up information is provided on this After Visit Summary.  MyChart is used to connect with patients for Virtual Visits (Telemedicine).  Patients are able to view lab/test results, encounter notes, upcoming appointments, etc.  Non-urgent messages can be sent to your provider as well.   To learn more about what you can do with MyChart, go to [email or web address].    Your next appointment:    As needed 

## 2020-07-09 NOTE — Telephone Encounter (Signed)
Letter was typed and sent via [facility].  Typically you are still able to obtain EKGs w/ spinal cord stimulators so I'm not sure what the issue was.

## 2020-07-09 NOTE — Assessment & Plan Note (Signed)
Ongoing issue.  Following w/ Endocrinology and Nephrology.

## 2020-07-09 NOTE — Assessment & Plan Note (Signed)
Deteriorated.  Will stop the Lasix and switch to Torsemide for symptom relief.  Will refer to cards as pt has not been seen recently and she has multiple risk factors for heart failure

## 2020-07-09 NOTE — Progress Notes (Signed)
Referring-Katherine Birdie Riddle, MD Reason for referral-chest pain  HPI: 70 year old female for evaluation of chest pain at request of Annye Asa, MD.  Nuclear study January 2021 showed ejection fraction 57%, breast attenuation but no ischemia.  Echocardiogram January 2021 showed normal LV function, severe left ventricular hypertrophy, mild mitral regurgitation, trace aortic insufficiency, mildly dilated ascending aorta at 38 mm, mild right ventricular enlargement, dilated pulmonary artery.  ABIs October 2021 normal.  Chest CT March 2022 showed thoracic aortic aneurysm measuring 4.1 cm unchanged compared to previous.  Coronary calcification noted.  Patient seen in the emergency room March 2022 with chest pain.  Troponins normal.  Creatinine 2.05, hemoglobin 12. Patient was felt possibly to have musculoskeletal pain and was given prednisone.  Asked to follow-up with cardiology.  Note she has had intermittent chest pain for 3 years.  Patient states her pain is substernal and radiates to her back.  It increases with certain movements.  She states there is occasional nausea, dyspnea and diaphoresis.  Her most recent episode started on Saturday and has been continuous since without completely resolving.  It is reproduced with palpation.  She otherwise has some dyspnea on exertion.  Occasional pedal edema.  No syncope.  Cardiology now asked to evaluate.  Current Outpatient Medications  Medication Sig Dispense Refill  . acetaminophen (TYLENOL) 500 MG tablet Take 500-1,000 mg by mouth every 6 (six) hours as needed for moderate pain, headache or mild pain.    . Alcohol Swabs (B-D SINGLE USE SWABS REGULAR) PADS Use as directed. 100 each 1  . alendronate (FOSAMAX) 70 MG tablet Take 1 tablet (70 mg total) by mouth once a week. Take with a full glass of water on an empty stomach. 12 tablet 3  . amLODipine (NORVASC) 10 MG tablet TAKE 1 TABLET BY MOUTH EVERY DAY 90 tablet 3  . aspirin 81 MG tablet Take 81 mg by  mouth daily.    . Blood Glucose Calibration (TRUE METRIX LEVEL 2) Normal SOLN Use as directed 3 each 3  . Blood Glucose Monitoring Suppl (TRUE METRIX AIR GLUCOSE METER) w/Device KIT 1 kit by Does not apply route 3 (three) times daily. 1 kit 0  . carvedilol (COREG) 6.25 MG tablet Take 1 tablet (6.25 mg total) by mouth 2 (two) times daily with a meal. 180 tablet 1  . clindamycin (CLEOCIN) 300 MG capsule Take 300 mg by mouth every 6 (six) hours.    . clotrimazole-betamethasone (LOTRISONE) cream Apply 1 application topically daily as needed (for irritation).    . Continuous Blood Gluc Sensor (FREESTYLE LIBRE 14 DAY SENSOR) MISC Inject 1 each into the skin every 14 (fourteen) days. Use as directed. 2 each 2  . Continuous Blood Gluc Transmit (DEXCOM G6 TRANSMITTER) MISC 1 Piece by Does not apply route as directed. 1 each 1  . ferrous gluconate (FERGON) 324 MG tablet TAKE 1 TABLET (324 MG TOTAL) BY MOUTH 2 (TWO) TIMES DAILY WITH A MEAL. 180 tablet 2  . glipiZIDE (GLUCOTROL XL) 5 MG 24 hr tablet TAKE 1 TABLET BY MOUTH EVERY DAY WITH BREAKFAST 90 tablet 0  . glucose blood (TRUE METRIX BLOOD GLUCOSE TEST) test strip Use as instructed 100 each 12  . hydrALAZINE (APRESOLINE) 50 MG tablet Take 50 mg by mouth 3 (three) times daily.    . insulin NPH-regular Human (NOVOLIN 70/30 RELION) (70-30) 100 UNIT/ML injection Inject 40 Units into the skin See admin instructions. Inject 40 units into the skin into the skin in the morning  and in the evening    . levothyroxine (SYNTHROID) 100 MCG tablet Take 1 tablet (100 mcg total) by mouth daily. 90 tablet 1  . losartan (COZAAR) 25 MG tablet Take 12.5 mg by mouth daily.    . metoCLOPramide (REGLAN) 10 MG tablet Take 1 tablet (10 mg total) by mouth 4 (four) times daily. 120 tablet 1  . ondansetron (ZOFRAN) 4 MG tablet Take 1 tablet (4 mg total) by mouth every 8 (eight) hours as needed for nausea or vomiting. 30 tablet 1  . pantoprazole (PROTONIX) 40 MG tablet Take 1 tablet (40  mg total) by mouth 2 (two) times daily. 180 tablet 1  . polyethylene glycol (MIRALAX / GLYCOLAX) packet Take 17 g by mouth daily as needed for moderate constipation (MIX AND DRINK).    . predniSONE (DELTASONE) 50 MG tablet Take 1 tablet (50 mg total) by mouth daily with breakfast for 5 days. 5 tablet 0  . rosuvastatin (CRESTOR) 20 MG tablet Take 1 tablet (20 mg total) by mouth daily at 6 PM. 90 tablet 1  . torsemide (DEMADEX) 20 MG tablet Take 1 tablet (20 mg total) by mouth 2 (two) times daily. 60 tablet 3  . TRUEplus Lancets 33G MISC Use as directed 300 each 3   No current facility-administered medications for this visit.    Allergies  Allergen Reactions  . Dilaudid [Hydromorphone Hcl] Nausea And Vomiting  . Hydromorphone Nausea And Vomiting  . Lipitor [Atorvastatin] Other (See Comments)    Body & Muscle Aches  . Adhesive [Tape] Rash  . Ampicillin Rash and Other (See Comments)    Also developed welts on her hands when she took it in the hospital  . Latex Itching, Dermatitis, Rash and Other (See Comments)    Patient had a reaction to tape after surgery and they told her she was allergic to Latex   . Penicillins Rash and Other (See Comments)    Has patient had a PCN reaction causing immediate rash, facial/tongue/throat swelling, SOB or lightheadedness with hypotension: No Has patient had a PCN reaction causing severe rash involving mucus membranes or skin necrosis: No Has patient had a PCN reaction that required hospitalization: Yes - in hospital receiving treatment Has patient had a PCN reaction occurring within the last 10 years: No If all of the above answers are "NO", then may proceed with Cephalosporin use.      Past Medical History:  Diagnosis Date  . Anginal pain ([place]) 03/19/2012   saw Dr. Cathie Olden .. she thinks its more related to stomach issues  . Arthritis    "all over; mostly in my back" (03/20/2012)  . Breast cancer ([place])    "right" (03/20/2012)  . Cardiomyopathy   .  Chest pain 06/2005   Hospitalized, dystolic dysfunction  . Chronic lower back pain    "have 2 herniated discs; going to have to have a fusion" (03/20/2012)  . CKD (chronic kidney disease), stage III ([place])   . Family history of anesthesia complication    "father has nausea and vomiting"  . Gallstones   . GERD (gastroesophageal reflux disease)   . H/O hiatal hernia   . History of kidney stones   . History of right mastectomy   . Hypertension    "patient states never had HTN, takes Hyzaar for heart  . Hypothyroidism   . Irritable bowel syndrome   . Migraines    "it's been a long time since I've had one" (03/20/2012)  . Osteoarthritis   .  Peripheral neuropathy   . PONV (postoperative nausea and vomiting)   . Sciatica   . Type II diabetes mellitus ([place])     Past Surgical History:  Procedure Laterality Date  . ABDOMINAL HYSTERECTOMY  1993  . adenosin cardiolite  07/2005   (+) wall motion abnormality  . AXILLARY LYMPH NODE DISSECTION  2003   squamous cell cancer  . BACK SURGERY    . CARDIAC CATHETERIZATION  ? 2005 / 2007  . CHOLECYSTECTOMY N/A 01/24/2018   Procedure: LAPAROSCOPIC CHOLECYSTECTOMY;  Surgeon: Erroll Luna, MD;  Location: [address];  Service: General;  Laterality: N/A;  . CT abdomen and pelvis  02/2010   Same  . ESOPHAGOGASTRODUODENOSCOPY  2005   GERD  . KNEE ARTHROSCOPY  1990's   "right" (03/20/2012)  . LUMBAR FUSION N/A 08/22/2014   Procedure: Right L2-3 and right L1-2 transforaminal lumbar interbody fusion with pedicle screws, rods, sleeves, cages, local bone graft, Vivigen, cancellous chips;  Surgeon: Jessy Oto, MD;  Location: [facility];  Service: Orthopedics;  Laterality: N/A;  . LUMBAR LAMINECTOMY/DECOMPRESSION MICRODISCECTOMY N/A 02/04/2014   Procedure: RIGHT L2-3 MICRODISCECTOMY ;  Surgeon: Jessy Oto, MD;  Location: [facility];  Service: Orthopedics;  Laterality: N/A;  . MASTECTOMY Right   . MASTECTOMY WITH AXILLARY LYMPH NODE DISSECTION  12/2003   "right"  (03/20/2012)  . POSTERIOR CERVICAL FUSION/FORAMINOTOMY  2001  . SHOULDER ARTHROSCOPY W/ ROTATOR CUFF REPAIR  2003   Left  . THYROIDECTOMY  1977   Right  . TOTAL KNEE ARTHROPLASTY  2000   Right  . [place] of abdomen  07/2005   Fatty liver / kidney stones    Social History   Socioeconomic History  . Marital status: Married    Spouse name: Not on file  . Number of children: 1  . Years of education: Not on file  . Highest education level: Not on file  Occupational History  . Occupation: Retired since 2003  Tobacco Use  . Smoking status: Never Smoker  . Smokeless tobacco: Never Used  Vaping Use  . Vaping Use: Never used  Substance and Sexual Activity  . Alcohol use: No  . Drug use: No  . Sexual activity: Not on file  Other Topics Concern  . Not on file  Social History Narrative  . Not on file   Social Determinants of Health   Financial Resource Strain: Not on file  Food Insecurity: No Food Insecurity  . Worried About Charity fundraiser in the Last Year: Never true  . Ran Out of Food in the Last Year: Never true  Transportation Needs: No Transportation Needs  . Lack of Transportation (Medical): No  . Lack of Transportation (Non-Medical): No  Physical Activity: Not on file  Stress: Not on file  Social Connections: Not on file  Intimate Partner Violence: Not on file    Family History  Problem Relation Age of Onset  . Hypertension Mother   . Breast cancer Mother 65  . Diabetes Father   . Hypertension Father   . Hypertension Sister   . Hypertension Brother   . Hypertension Brother   . Diabetes Brother   . Diabetes Brother   . Diabetes Other        Family Hx. of Diabetes  . Hypertension Other        Family History of HTN  . Deep vein thrombosis Daughter   . Breast cancer Maternal Aunt   . Colon cancer Neg Hx   . Esophageal cancer  Neg Hx   . Liver cancer Neg Hx   . Rectal cancer Neg Hx   . Stomach cancer Neg Hx     ROS: Back pain but no fevers or chills,  productive cough, hemoptysis, dysphasia, odynophagia, melena, hematochezia, dysuria, hematuria, rash, seizure activity, orthopnea, PND, claudication. Remaining systems are negative.  Physical Exam:   Blood pressure 122/60, pulse (!) 51, height '5\' 4"'  (1.626 m), weight 215 lb (97.5 kg), SpO2 94 %.  General:  Well developed/well nourished in NAD Skin warm/dry Patient not depressed No peripheral clubbing Back-normal HEENT-normal/normal eyelids Neck supple/normal carotid upstroke bilaterally; no bruits; no JVD; no thyromegaly chest - CTA/ normal expansion CV - RRR/normal S1 and S2; no murmurs, rubs or gallops;  PMI nondisplaced Abdomen -NT/ND, no HSM, no mass, + bowel sounds, no bruit 2+ femoral pulses, no bruits Ext-1+ edema, no chords, 2+ DP Neuro-grossly nonfocal  ECG -July 06, 2020-normal sinus rhythm, no ST changes.  Personally reviewed  A/P  1 chest pain-pain seems most consistent with musculoskeletal pain as it is reproduced with palpation.  Her electrocardiogram at time of recent ER evaluation was normal and troponins were normal.  Her pain has been continuous for 5 days without completely resolving.  She was given prednisone but she states this increased her glucose.  I would be hesitant to give nonsteroidals in the setting of chronic renal insufficiency.  She will continue Tylenol and she will follow up with primary care.  2 thoracic aortic aneurysm-patient will need follow-up imaging in March 2023.  Follow-up primary care.  3 hypertension-blood pressure controlled.  Continue present medications and follow.  Kirk Ruths, MD

## 2020-07-09 NOTE — Assessment & Plan Note (Signed)
Ongoing issue.  Following w/ nephrology.

## 2020-07-10 NOTE — Telephone Encounter (Signed)
Letter stamped and  placed in bin to be mailed out today.

## 2020-07-14 IMAGING — US SOFT TISSUE ULTRASOUND HEAD/NECK
1 series · 13 of 25 positions shown · non-contrast
Comparison: None.

CLINICAL DATA: History of right thyroidectomy. Postsurgical
hypothyroidism.

EXAM:
THYROID ULTRASOUND
TECHNIQUE: Ultrasound examination of the thyroid gland and adjacent soft
tissues was performed.

[Series 1: soft tissue ultrasound head/neck · 0.07mm/px · 52 acquisitions, 13 frames shown]
[im 1/52]
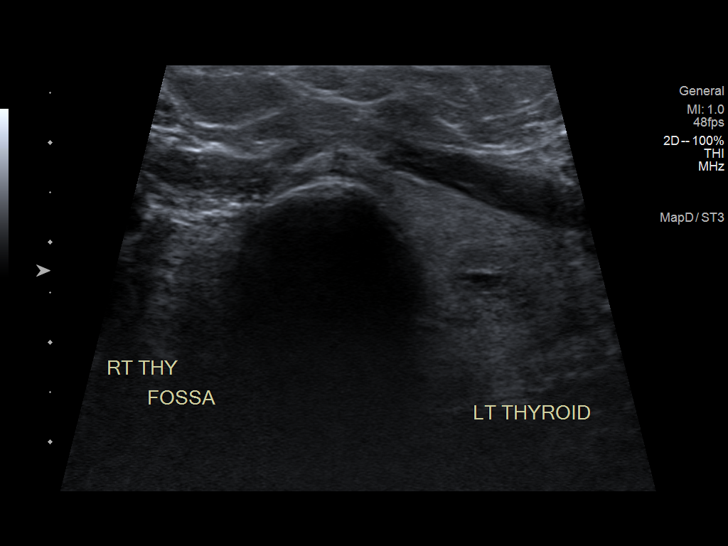
[im 5/52]
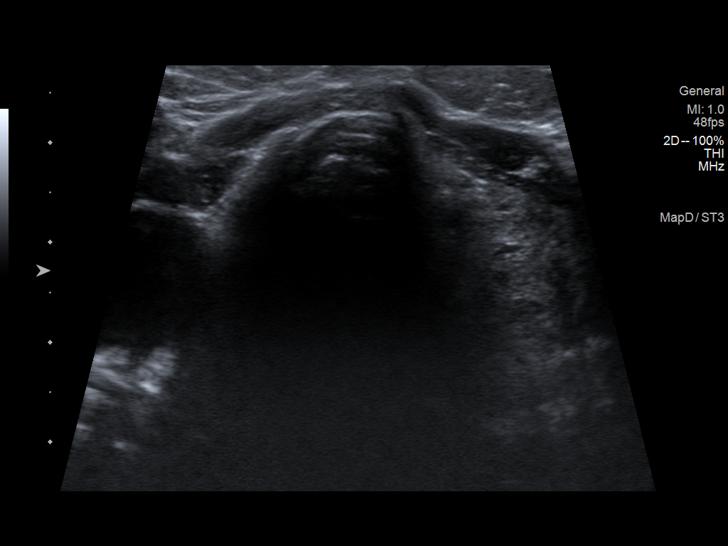
[im 9/52]
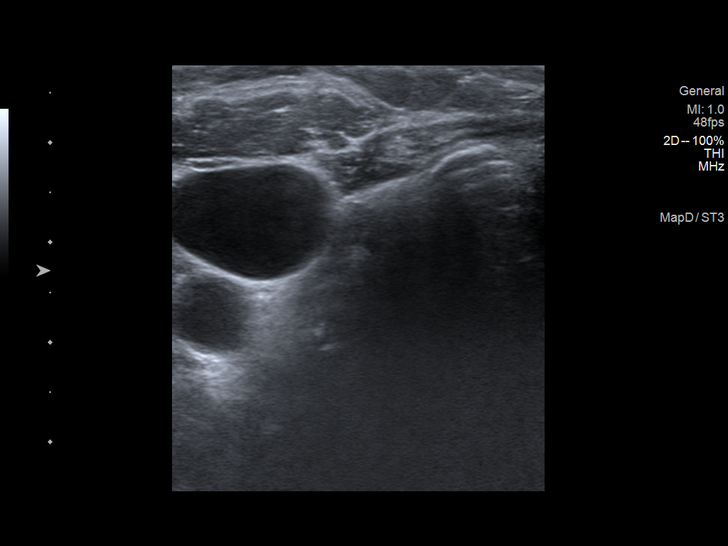
[im 13/52]
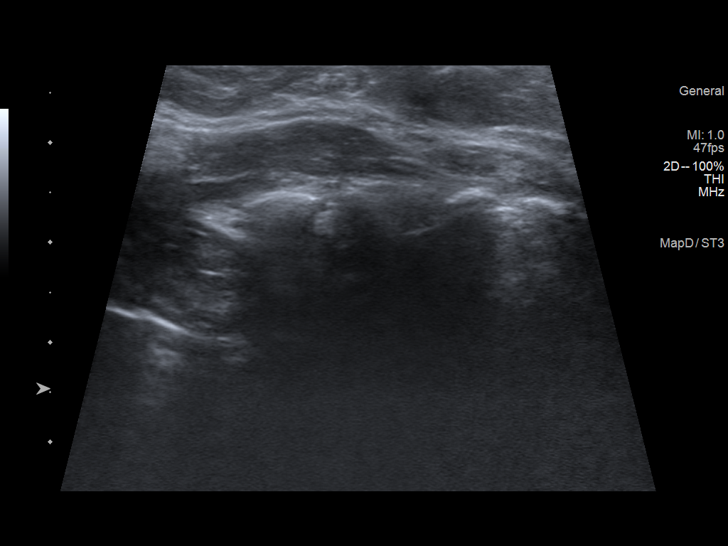
[im 18/52]
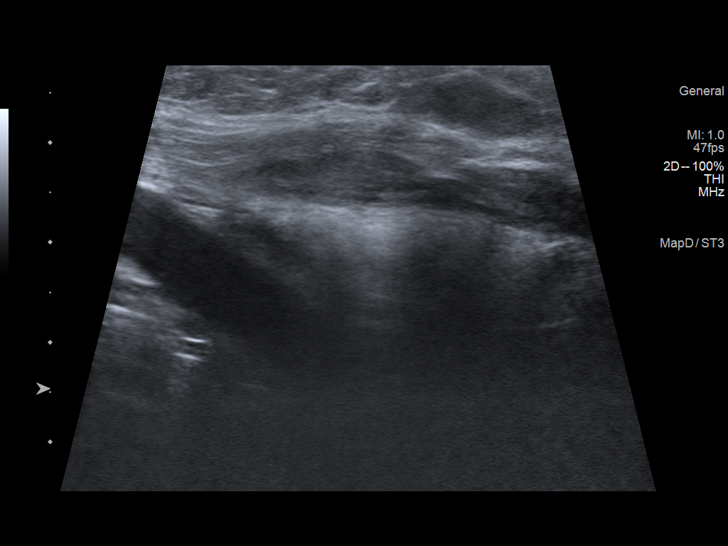
[im 22/52]
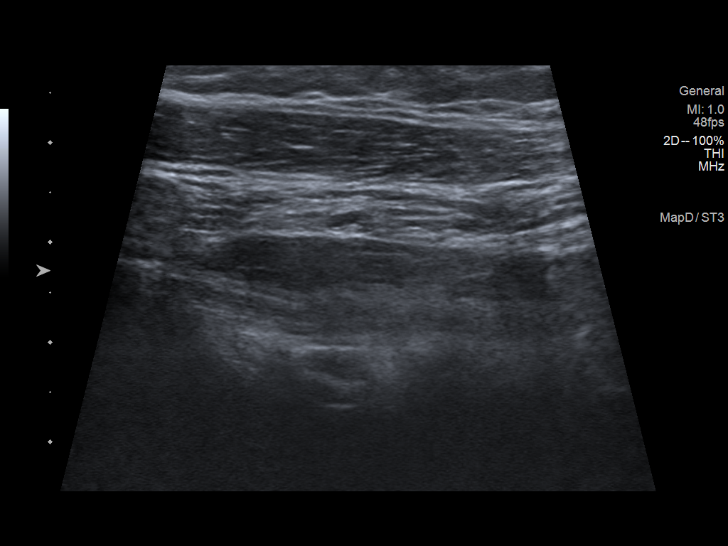
[im 26/52]
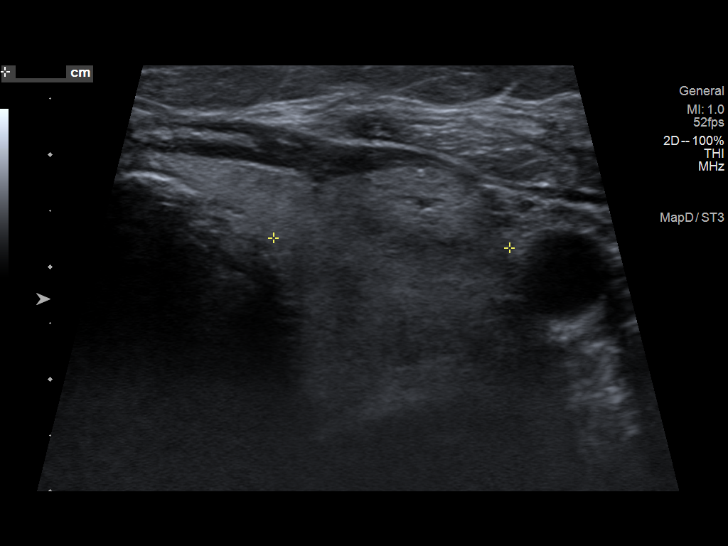
[im 30/52]
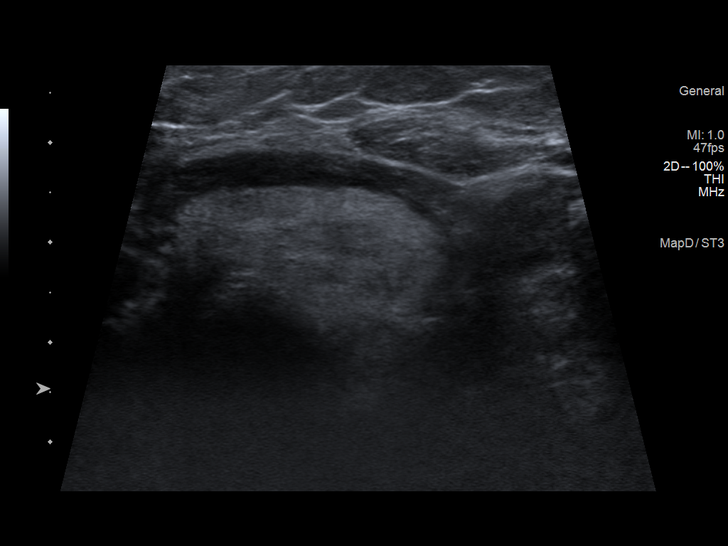
[im 35/52]
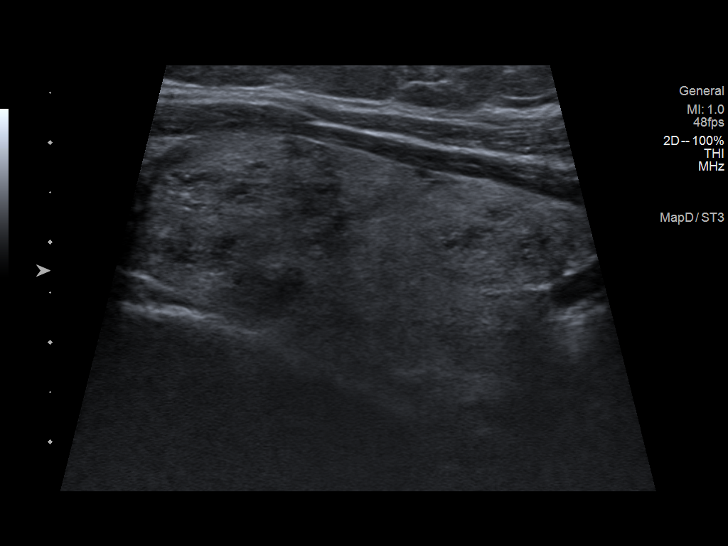
[im 39/52]
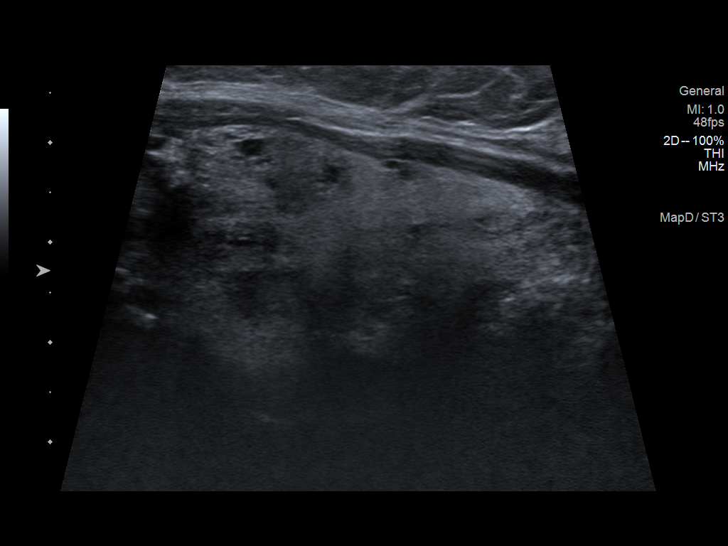
[im 43/52]
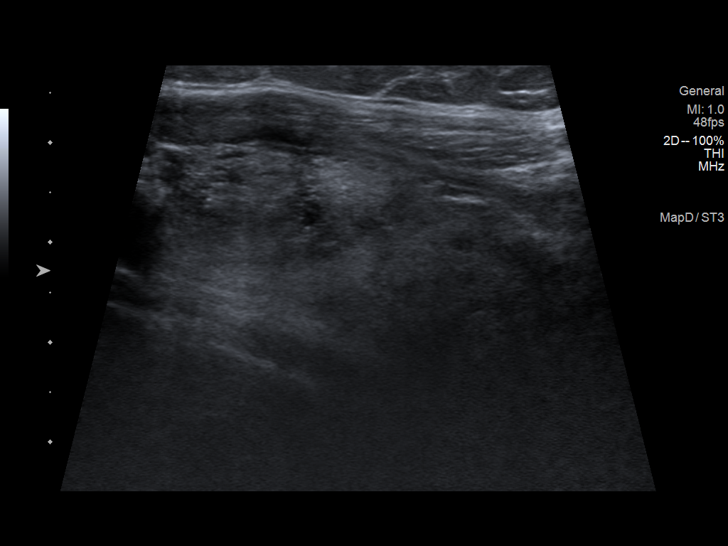
[im 47/52]
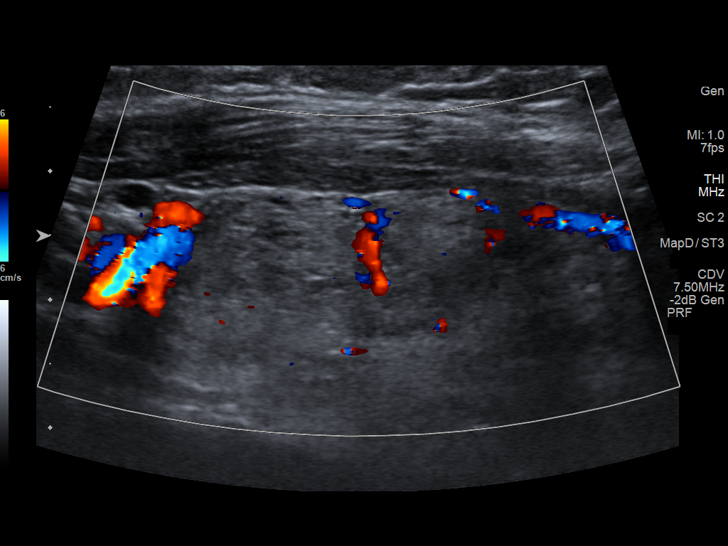
[im 52/52]
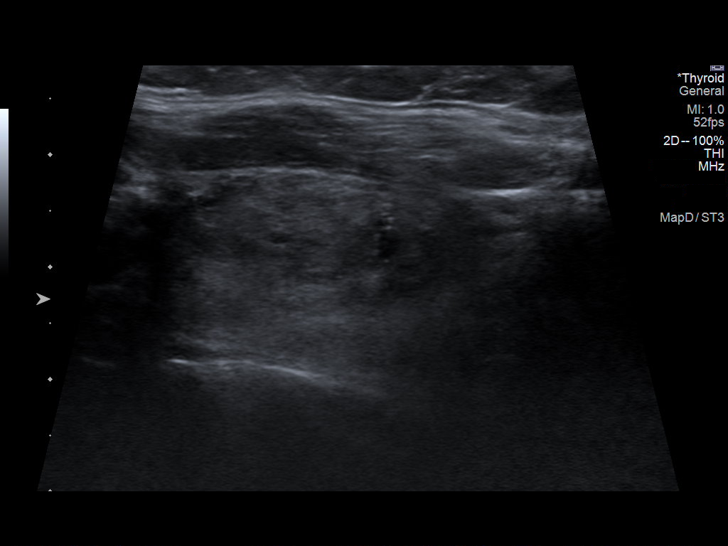

[13 of 25 positions shown; findings below may reference images not displayed]

FINDINGS: Parenchymal Echotexture: Moderately heterogenous

Isthmus: 0.3 cm

Right lobe: Surgically removed.

Left lobe: 5.7 x 2.2 x 2.1 cm

_________________________________________________________

Estimated total number of nodules >/= 1 cm: 1

Number of spongiform nodules >/=  2 cm not described below (TR1): 0

Number of mixed cystic and solid nodules >/= 1.5 cm not described
below (TR2): 0

_________________________________________________________

Right thyroid lobe has been surgically removed. No suspicious soft
tissue or nodularity at the right thyroidectomy bed.

Nodule # 1:

Location: Left; Superior

Maximum size: 2.9 cm; Other 2 dimensions: 1.1 x 1.9 cm

Composition: solid/almost completely solid (2)

Echogenicity: hypoechoic (2)

Shape: not taller-than-wide (0)

Margins: ill-defined (0)

Echogenic foci: punctate echogenic foci (3)

ACR TI-RADS total points: 7.

ACR TI-RADS risk category: TR5 (>/= 7 points).

ACR TI-RADS recommendations:

**Given size (>/= 1.0 cm) and appearance, fine needle aspiration of
this highly suspicious nodule should be considered based on TI-RADS
criteria.

_________________________________________________________
IMPRESSION: 1. 2.9 cm nodule in the superior left thyroid lobe. This is a TR 5
nodule and meets criteria for ultrasound-guided biopsy.
2. Postsurgical changes compatible with a right hemithyroidectomy.

The above is in keeping with the ACR TI-RADS recommendations - [HOSPITAL] 3456;[DATE].

## 2020-07-16 DIAGNOSIS — G894 Chronic pain syndrome: Secondary | ICD-10-CM | POA: Diagnosis not present

## 2020-07-16 DIAGNOSIS — M960 Pseudarthrosis after fusion or arthrodesis: Secondary | ICD-10-CM | POA: Diagnosis not present

## 2020-07-16 DIAGNOSIS — M4716 Other spondylosis with myelopathy, lumbar region: Secondary | ICD-10-CM | POA: Diagnosis not present

## 2020-07-16 DIAGNOSIS — M4326 Fusion of spine, lumbar region: Secondary | ICD-10-CM | POA: Diagnosis not present

## 2020-07-17 ENCOUNTER — Ambulatory Visit (INDEPENDENT_AMBULATORY_CARE_PROVIDER_SITE_OTHER): Payer: Medicare HMO | Admitting: Family Medicine

## 2020-07-17 ENCOUNTER — Other Ambulatory Visit: Payer: Self-pay

## 2020-07-17 ENCOUNTER — Encounter: Payer: Self-pay | Admitting: Family Medicine

## 2020-07-17 VITALS — BP 130/65 | HR 56 | Temp 97.0°F | Resp 17 | Ht 64.0 in | Wt 215.0 lb

## 2020-07-17 DIAGNOSIS — Z01818 Encounter for other preprocedural examination: Secondary | ICD-10-CM | POA: Diagnosis not present

## 2020-07-17 NOTE — Patient Instructions (Addendum)
Follow up as needed or as scheduled You are good to go for surgery!!! GOOD LUCK!!!!

## 2020-07-17 NOTE — Progress Notes (Signed)
   Subjective:    Patient ID: [name], female    DOB: 08-18-1950, 70 y.o.   MRN: [id number]  HPI Surgical clearance- pt needs clearance for implantation of spinal cord stimulator.  Saw Cardiology on 07/09/20 and was cleared.  No hx of problems w/ anesthesia.  Recent A1C 7.9, Hgb 12.0, platelets 2.91.  Cr 2.05.  Needs updated EKG as she had spinal stimulator in place during last ER visit.   Review of Systems For ROS see HPI   This visit occurred during the SARS-CoV-2 public health emergency.  Safety protocols were in place, including screening questions prior to the visit, additional usage of staff PPE, and extensive cleaning of exam room while observing appropriate contact time as indicated for disinfecting solutions.       Objective:   Physical Exam Vitals reviewed.  Constitutional:      General: She is not in acute distress.    Appearance: Normal appearance. She is well-developed. She is obese. She is not ill-appearing.  HENT:     Head: Normocephalic and atraumatic.  Eyes:     Conjunctiva/sclera: Conjunctivae normal.     Pupils: Pupils are equal, round, and reactive to light.  Neck:     Thyroid: No thyromegaly.  Cardiovascular:     Rate and Rhythm: Normal rate and regular rhythm.     Pulses: Normal pulses.     Heart sounds: Normal heart sounds. No murmur heard.   Pulmonary:     Effort: Pulmonary effort is normal. No respiratory distress.     Breath sounds: Normal breath sounds.  Abdominal:     General: There is no distension.     Palpations: Abdomen is soft.     Tenderness: There is no abdominal tenderness.  Musculoskeletal:     Cervical back: Normal range of motion and neck supple.     Right lower leg: No edema.     Left lower leg: No edema.  Lymphadenopathy:     Cervical: No cervical adenopathy.  Skin:    General: Skin is warm and dry.  Neurological:     Mental Status: She is alert and oriented to person, place, and time.  Psychiatric:        Behavior:  Behavior normal.           Assessment & Plan:  Pre-op examination- pt has surgery to implant spinal stimulator upcoming.  She recently had CPE and w/ exception of CP, no cause for concern.  Cardiology work up was negative and EKG here today unchanged from previous and WNL.  Pt aware she needs to hold her ASA for 7 days prior to surgery.  Based on recent labs, EKG, and today's exam she is cleared to proceed.  Will complete and fax forms.

## 2020-07-22 ENCOUNTER — Other Ambulatory Visit: Payer: Self-pay | Admitting: Family Medicine

## 2020-07-29 ENCOUNTER — Ambulatory Visit: Payer: Medicare HMO | Admitting: Family Medicine

## 2020-07-30 ENCOUNTER — Other Ambulatory Visit: Payer: Self-pay | Admitting: "Endocrinology

## 2020-08-04 DIAGNOSIS — N1832 Chronic kidney disease, stage 3b: Secondary | ICD-10-CM | POA: Diagnosis not present

## 2020-08-05 ENCOUNTER — Other Ambulatory Visit: Payer: Self-pay

## 2020-08-05 ENCOUNTER — Other Ambulatory Visit: Payer: Self-pay | Admitting: Family Medicine

## 2020-08-05 DIAGNOSIS — E213 Hyperparathyroidism, unspecified: Secondary | ICD-10-CM

## 2020-08-05 DIAGNOSIS — I272 Pulmonary hypertension, unspecified: Secondary | ICD-10-CM | POA: Diagnosis not present

## 2020-08-05 DIAGNOSIS — E1122 Type 2 diabetes mellitus with diabetic chronic kidney disease: Secondary | ICD-10-CM | POA: Diagnosis not present

## 2020-08-05 DIAGNOSIS — N1832 Chronic kidney disease, stage 3b: Secondary | ICD-10-CM | POA: Diagnosis not present

## 2020-08-05 DIAGNOSIS — R768 Other specified abnormal immunological findings in serum: Secondary | ICD-10-CM | POA: Diagnosis not present

## 2020-08-05 DIAGNOSIS — I700 Atherosclerosis of aorta: Secondary | ICD-10-CM | POA: Diagnosis not present

## 2020-08-05 DIAGNOSIS — I5032 Chronic diastolic (congestive) heart failure: Secondary | ICD-10-CM | POA: Diagnosis not present

## 2020-08-05 DIAGNOSIS — R803 Bence Jones proteinuria: Secondary | ICD-10-CM | POA: Diagnosis not present

## 2020-08-05 DIAGNOSIS — I129 Hypertensive chronic kidney disease with stage 1 through stage 4 chronic kidney disease, or unspecified chronic kidney disease: Secondary | ICD-10-CM | POA: Diagnosis not present

## 2020-08-05 MED ORDER — LEVOTHYROXINE SODIUM 100 MCG PO TABS: 100.0000 ug | ORAL_TABLET | Freq: Every day | ORAL | 1 refills | Status: DC

## 2020-08-11 ENCOUNTER — Ambulatory Visit: Payer: Medicare HMO | Admitting: Cardiology

## 2020-08-17 ENCOUNTER — Ambulatory Visit (HOSPITAL_BASED_OUTPATIENT_CLINIC_OR_DEPARTMENT_OTHER): Payer: Medicare HMO | Attending: Pulmonary Disease | Admitting: Pulmonary Disease

## 2020-08-20 ENCOUNTER — Telehealth: Payer: Medicare HMO

## 2020-08-21 DIAGNOSIS — G894 Chronic pain syndrome: Secondary | ICD-10-CM | POA: Diagnosis not present

## 2020-08-21 DIAGNOSIS — M4716 Other spondylosis with myelopathy, lumbar region: Secondary | ICD-10-CM | POA: Diagnosis not present

## 2020-08-21 DIAGNOSIS — M4326 Fusion of spine, lumbar region: Secondary | ICD-10-CM | POA: Diagnosis not present

## 2020-08-24 ENCOUNTER — Ambulatory Visit: Payer: Medicare HMO

## 2020-08-25 ENCOUNTER — Telehealth: Payer: Self-pay | Admitting: Family Medicine

## 2020-08-25 ENCOUNTER — Telehealth: Payer: Self-pay

## 2020-08-25 DIAGNOSIS — Z1231 Encounter for screening mammogram for malignant neoplasm of breast: Secondary | ICD-10-CM | POA: Diagnosis not present

## 2020-08-25 LAB — HM MAMMOGRAPHY

## 2020-08-25 NOTE — Telephone Encounter (Signed)
Please call on Thursday - Will Be out of her office on Wednesday  - She needs a tracing of the EKG that was done on patient in April.  This is for a pre surgical clearance.

## 2020-08-25 NOTE — Chronic Care Management (AMB) (Signed)
Chronic Care Management Pharmacy Assistant   Name: [name]  MRN: [id number] DOB: 01-04-1951  Reason for Encounter: Diabetes Mellitus Disease State Call   Recent office visits:  07/17/20- Desiree Asa, MD- Seen for pre- op examination, instructed to hold Pratt 7 days prior to surgery, follow up as needed  06/25/20- Desiree Asa, MD- seen for physical exam, started torsemide 20 mg bid, discontinued lasix, follow up 1 month   Recent consult visits:  07/09/20- Desiree Ruths, MD ( Cardiology)- seen for initial visit and evaluation of precordial pain, no medication changes, follow up as needed 06/24/20- Desiree Rist, MD (Pulmonology)- Patient seen for obstructive sleep apnea, titration study scheduled, follow up 3 months  06/22/20- Desiree Pigg, NP (Endo)- seen for hypercalcemia, no medication changes,follow up 3 months for diabetes and thyroid  06/08/20- Desiree Pigg, NP (Endo)- Advice for weight management, no medication changes, follow up 2 weeks  Hospital visits:  None in previous 6 months  Medications: Outpatient Encounter Medications as of 08/25/2020  Medication Sig  . acetaminophen (TYLENOL) 500 MG tablet Take 500-1,000 mg by mouth every 6 (six) hours as needed for moderate pain, headache or mild pain.  . Alcohol Swabs (B-D SINGLE USE SWABS REGULAR) PADS Use as directed.  [name] Kitchen alendronate (FOSAMAX) 70 MG tablet Take 1 tablet (70 mg total) by mouth once a week. Take with a full glass of water on an empty stomach.  [name] Kitchen amLODipine (NORVASC) 10 MG tablet TAKE 1 TABLET BY MOUTH EVERY DAY  . aspirin 81 MG tablet Take 81 mg by mouth daily.  . Blood Glucose Calibration (TRUE METRIX LEVEL 2) Normal SOLN Use as directed  . Blood Glucose Monitoring Suppl (TRUE METRIX AIR GLUCOSE METER) w/Device KIT 1 kit by Does not apply route 3 (three) times daily.  . carvedilol (COREG) 6.25 MG tablet Take 1 tablet (6.25 mg total) by mouth 2 (two) times daily with a meal.  . clindamycin  (CLEOCIN) 300 MG capsule Take 300 mg by mouth every 6 (six) hours.  . clotrimazole-betamethasone (LOTRISONE) cream APPLY TO AFFECTED AREA TWICE A DAY  . Continuous Blood Gluc Sensor (FREESTYLE LIBRE 14 DAY SENSOR) MISC Inject 1 each into the skin every 14 (fourteen) days. Use as directed.  . Continuous Blood Gluc Transmit (DEXCOM G6 TRANSMITTER) MISC 1 Piece by Does not apply route as directed.  . ferrous gluconate (FERGON) 324 MG tablet TAKE 1 TABLET (324 MG TOTAL) BY MOUTH 2 (TWO) TIMES DAILY WITH A MEAL.  [name] Kitchen glipiZIDE (GLUCOTROL XL) 5 MG 24 hr tablet TAKE 1 TABLET BY MOUTH EVERY DAY WITH BREAKFAST  . glucose blood (TRUE METRIX BLOOD GLUCOSE TEST) test strip Use as instructed  . hydrALAZINE (APRESOLINE) 50 MG tablet Take 50 mg by mouth 3 (three) times daily.  . insulin NPH-regular Human (NOVOLIN 70/30 RELION) (70-30) 100 UNIT/ML injection Inject 40 Units into the skin See admin instructions. Inject 40 units into the skin into the skin in the morning and in the evening  . levothyroxine (SYNTHROID) 100 MCG tablet Take 1 tablet (100 mcg total) by mouth daily.  [name] Kitchen losartan (COZAAR) 25 MG tablet Take 12.5 mg by mouth daily.  . metoCLOPramide (REGLAN) 10 MG tablet Take 1 tablet (10 mg total) by mouth 4 (four) times daily.  . ondansetron (ZOFRAN) 4 MG tablet Take 1 tablet (4 mg total) by mouth every 8 (eight) hours as needed for nausea or vomiting.  . pantoprazole (PROTONIX) 40 MG tablet Take 1 tablet (40 mg total) by mouth  2 (two) times daily.  . polyethylene glycol (MIRALAX / GLYCOLAX) packet Take 17 g by mouth daily as needed for moderate constipation (MIX AND DRINK).  . rosuvastatin (CRESTOR) 20 MG tablet TAKE 1 TABLET EVERY DAY AT 6 PM  . torsemide (DEMADEX) 20 MG tablet Take 1 tablet (20 mg total) by mouth 2 (two) times daily.  . TRUEplus Lancets 33G MISC Use as directed   No facility-administered encounter medications on file as of 08/25/2020.   Recent Relevant Labs: Lab Results  Component  Value Date/Time   HGBA1C 7.9 (A) 06/08/2020 11:17 AM   HGBA1C 8.8 (A) 03/09/2020 11:08 AM   MICROALBUR 570.4 08/05/2019 03:15 PM   MICROALBUR 188.7 06/01/2018 03:25 PM    Kidney Function Lab Results  Component Value Date/Time   CREATININE 2.05 (H) 07/06/2020 04:49 PM   CREATININE 2.01 (H) 06/02/2020 08:16 AM   CREATININE 1.68 (H) 10/28/2019 01:32 PM   CREATININE 1.64 (H) 09/19/2019 02:20 PM   CREATININE 1.55 (H) 08/05/2019 03:15 PM   GFR 36.33 (L) 10/28/2019 01:32 PM   GFRNONAA 25 (L) 07/06/2020 04:49 PM   GFRNONAA 31 (L) 09/19/2019 02:20 PM   GFRNONAA 38 (L) 03/01/2019 02:07 PM   GFRAA 25 (L) 10/19/2019 03:30 AM   GFRAA 36 (L) 09/19/2019 02:20 PM   GFRAA 44 (L) 03/01/2019 02:07 PM   I spoke with [name] and she has not been doing very good. She has not been taking as much care of herself as she would like. She has  had three deaths recently and it has put a damper on her routine. For this reason and others, she has not been checking her BS. She says that she needs a new monitor as well because using needles makes her neuropathy in her hands and feet worse. We discussed connecting with the [name] for any alternatives as she informed me that she knows her BS has been very high as of late. [name] has also been experiencing pain in her back which is her main concern. She has been trying tylenol and tramadol but has found no relief. The pain is consistent and crippling in her spine and is only alleviated when she sits or lays down. [name] will be having  A spine simulator surgery at the end of this month which she hopes will help most of her pain. She informed me that she can't have any more surgeries so she is hopefull for this on.    Current antihyperglycemic regimen:  Glipizide 5 mg tablet daily with breakfast Insulin NPH-regular Human (70-30) 100 unit/mL  What recent interventions/DTPs have been made to improve glycemic control:   Have there been any recent hospitalizations  or ED visits since last visit with [name]? No  Patient denies hypoglycemic symptoms  Patient reports hyperglycemic symptoms, including fatigue   How often are you checking your blood sugar?  Patient does not check her BS  What are your blood sugars ranging?  o Fasting: N/A o Before meals: N/A o After meals: N/A o Bedtime: N/A  During the week, how often does your blood glucose drop below 70? Never  Are you checking your feet daily/regularly?  Patient has not been checking regularly, no concerns  Adherence Review: Is the patient currently on a STATIN medication? Yes Is the patient currently on ACE/ARB medication? Yes Does the patient have >5 day gap between last estimated fill dates? Yes  Glipizide 5 mg - 90 DS last filled 05/08/20 Insulin NPH-regular Human (70-30) 100 unit/mL  Star Rating Drugs: Patient reports having sufficient supply  Losartan 25 mg- 90 DS last filled 12/27/19 Rosuvastatin 20 mg- 90 DS last filled 02/25/20  Wilford Sports CPA,CMA

## 2020-08-26 ENCOUNTER — Ambulatory Visit (INDEPENDENT_AMBULATORY_CARE_PROVIDER_SITE_OTHER): Payer: Medicare HMO

## 2020-08-26 DIAGNOSIS — M816 Localized osteoporosis [Lequesne]: Secondary | ICD-10-CM

## 2020-08-26 DIAGNOSIS — I10 Essential (primary) hypertension: Secondary | ICD-10-CM | POA: Diagnosis not present

## 2020-08-26 DIAGNOSIS — K219 Gastro-esophageal reflux disease without esophagitis: Secondary | ICD-10-CM

## 2020-08-26 NOTE — Telephone Encounter (Signed)
Ok to print and fax EKG to requesting provider.  Please call her tomorrow at her office number [phone] to get the best fax number

## 2020-08-26 NOTE — Telephone Encounter (Signed)
EKG for patient has been printed and will fax on 08/27/20 when [name] is back in office at [facility] to call and get fax number.

## 2020-08-26 NOTE — Progress Notes (Signed)
Chronic Care Management Pharmacy Note  08/31/2020 Name:  [name] MRN:  [id number] DOB:  1950-11-29  Subjective: [name] is an 70 y.o. year old female who is a primary patient of Tabori, Desiree Millet, MD.  The CCM team was consulted for assistance with disease management and care coordination needs.    Engaged with patient by telephone for follow up visit in response to provider referral for pharmacy case management and/or care coordination services.   Consent to Services:  The patient was given information about Chronic Care Management services, agreed to services, and gave verbal consent prior to initiation of services.  Please see initial visit note for detailed documentation.   Patient Care Team: Desiree Minium, MD as PCP - General (Family Medicine) Desiree Haw, MD as PCP - Cardiology (Cardiology) Desiree Anger, MD as Consulting Physician (Endocrinology) [name], DPM as Consulting Physician (Podiatry) Desiree Lever, MD as Consulting Physician (Pulmonary Disease) Desiree Skinner, MD as Consulting Physician (Ophthalmology) [name], [facility] as Pharmacist (Pharmacist) Desiree, Tomasa Blase, RN as [place] Management Desiree Desanctis, MD as Consulting Physician (Nephrology)  Objective:  Lab Results  Component Value Date   CREATININE 2.05 (H) 07/06/2020   CREATININE 2.01 (H) 06/02/2020   CREATININE 1.68 (H) 10/28/2019    Lab Results  Component Value Date   HGBA1C 7.9 (A) 06/08/2020   Last diabetic Eye exam:  Lab Results  Component Value Date/Time   HMDIABEYEEXA Retinopathy (A) 04/20/2020 12:00 AM    Last diabetic Foot exam:  Lab Results  Component Value Date/Time   HMDIABFOOTEX yes 09/21/2009 12:00 AM        Component Value Date/Time   CHOL 133 06/02/2020 0816   TRIG 275 (H) 06/02/2020 0816   HDL 41 (L) 06/02/2020 0816   CHOLHDL 3.2 06/02/2020 0816   VLDL 42 (H) 04/17/2019 0227    LDLCALC 60 06/02/2020 0816    Hepatic Function Latest Ref Rng & Units 07/06/2020 06/02/2020 09/19/2019  Total Protein 6.5 - 8.1 g/dL 8.0 7.6 7.3  Albumin 3.5 - 5.0 g/dL 4.0 - 3.2(L)  AST 15 - 41 U/L '18 14 25  ' ALT 0 - 44 U/L '15 11 21  ' Alk Phosphatase 38 - 126 U/L 55 - 65  Total Bilirubin 0.3 - 1.2 mg/dL 0.6 0.3 0.3  Bilirubin, Direct 0.0 - 0.2 mg/dL - - -    Lab Results  Component Value Date/Time   TSH 1.35 06/02/2020 08:16 AM   TSH 0.83 10/28/2019 01:32 PM   FREET4 1.2 06/02/2020 08:16 AM   FREET4 1.1 03/01/2019 02:29 PM    CBC Latest Ref Rng & Units 07/06/2020 10/28/2019 10/19/2019  WBC 4.0 - 10.5 K/uL 8.9 8.9 8.0  Hemoglobin 12.0 - 15.0 g/dL 12.0 11.5(L) 10.2(L)  Hematocrit 36.0 - 46.0 % 37.9 34.6(L) 33.0(L)  Platelets 150 - 400 K/uL 291 340.0 289    Lab Results  Component Value Date/Time   VD25OH 15 (L) 06/02/2020 08:16 AM   VD25OH 17 (L) 10/02/2018 11:59 AM   Clinical ASCVD:  The 10-year ASCVD risk score [name] DC Jr., et al., 2013) is: 30.9%   Values used to calculate the score:     Age: 41 years     Sex: Female     Is Non-Hispanic African American: Yes     Diabetic: Yes     Tobacco smoker: No     Systolic Blood Pressure: 414 mmHg     Is BP  treated: Yes     HDL Cholesterol: 41 mg/dL     Total Cholesterol: 133 mg/dL     Social History   Tobacco Use  Smoking Status Never Smoker  Smokeless Tobacco Never Used   BP Readings from Last 3 Encounters:  07/17/20 130/65  07/09/20 122/60  07/06/20 (!) 156/68   Pulse Readings from Last 3 Encounters:  07/17/20 (!) 56  07/09/20 (!) 51  07/06/20 70   Wt Readings from Last 3 Encounters:  07/17/20 215 lb (97.5 kg)  07/09/20 215 lb (97.5 kg)  07/06/20 215 lb (97.5 kg)    Assessment: Review of patient past medical history, allergies, medications, health status, including review of consultants reports, laboratory and other test data, was performed as part of comprehensive evaluation and provision of chronic care  management services.   SDOH:  (Social Determinants of Health) assessments and interventions performed: Yes   CCM Care Plan  Allergies  Allergen Reactions  . Dilaudid [Hydromorphone Hcl] Nausea And Vomiting  . Hydromorphone Nausea And Vomiting  . Lipitor [Atorvastatin] Other (See Comments)    Body & Muscle Aches  . Adhesive [Tape] Rash  . Ampicillin Rash and Other (See Comments)    Also developed welts on her hands when she took it in the hospital  . Latex Itching, Dermatitis, Rash and Other (See Comments)    Patient had a reaction to tape after surgery and they told her she was allergic to Latex   . Penicillins Rash and Other (See Comments)    Has patient had a PCN reaction causing immediate rash, facial/tongue/throat swelling, SOB or lightheadedness with hypotension: No Has patient had a PCN reaction causing severe rash involving mucus membranes or skin necrosis: No Has patient had a PCN reaction that required hospitalization: Yes - in hospital receiving treatment Has patient had a PCN reaction occurring within the last 10 years: No If all of the above answers are "NO", then may proceed with Cephalosporin use.     Medications Reviewed Today    Reviewed by [name], [facility] (Pharmacist) on 08/26/20 at 1458  Med List Status: <None>  Medication Order Taking? Sig Documenting Provider Last Dose Status Informant  acetaminophen (TYLENOL) 500 MG tablet [id number]  Take 500-1,000 mg by mouth every 6 (six) hours as needed for moderate pain, headache or mild pain. [provider]  Active Self  Alcohol Swabs (B-D SINGLE USE SWABS REGULAR) PADS [id number]  Use as directed. Desiree Minium, MD  Active Self  alendronate (FOSAMAX) 70 MG tablet [id number]  Take 1 tablet (70 mg total) by mouth once a week. Take with a full glass of water on an empty stomach. Desiree Minium, MD  Active   amLODipine (NORVASC) 10 MG tablet [id number]  TAKE 1 TABLET BY MOUTH EVERY DAY Susy Frizzle, MD   Active Self  aspirin 81 MG tablet [id number]  Take 81 mg by mouth daily. [provider]  Active Self  Blood Glucose Monitoring Suppl (TRUE METRIX AIR GLUCOSE METER) w/Device KIT [id number]  1 kit by Does not apply route 3 (three) times daily. Desiree Minium, MD  Active Self  carvedilol (COREG) 6.25 MG tablet [id number]  Take 1 tablet (6.25 mg total) by mouth 2 (two) times daily with a meal. Desiree Minium, MD  Active Self           Med Note Jeanann Lewandowsky Jul 09, 2020 10:38 AM)    clindamycin (CLEOCIN) 300 MG capsule [id number] No  Take 300 mg by mouth every 6 (six) hours.  Patient not taking: Reported on 08/26/2020   [provider] Not Taking Active Self           Med Note Jeanann Lewandowsky Jul 09, 2020 10:38 AM)    Continuous Blood Gluc Sensor (FREESTYLE LIBRE 14 DAY SENSOR) [place] [id number]  Inject 1 each into the skin every 14 (fourteen) days. Use as directed. Brita Romp, NP  Active Self  ferrous gluconate (FERGON) 324 MG tablet [id number] Yes TAKE 1 TABLET (324 MG TOTAL) BY MOUTH 2 (TWO) TIMES DAILY WITH A MEAL. Julian Hy, DO Taking Active Self  glipiZIDE (GLUCOTROL XL) 5 MG 24 hr tablet [id number] Yes TAKE 1 TABLET BY MOUTH EVERY DAY WITH BREAKFAST Brita Romp, NP Taking Active   glucose blood (TRUE METRIX BLOOD GLUCOSE TEST) test strip [id number]  Use as instructed Desiree Minium, MD  Active Self  hydrALAZINE (APRESOLINE) 50 MG tablet [id number]  Take 50 mg by mouth 3 (three) times daily. [provider]  Active Self  insulin NPH-regular Human (NOVOLIN 70/30 RELION) (70-30) 100 UNIT/ML injection [id number]  Inject 40 Units into the skin See admin instructions. Inject 40 units into the skin into the skin in the morning and in the evening [provider]  Active Self  levothyroxine (SYNTHROID) 100 MCG tablet [id number]  Take 1 tablet (100 mcg total) by mouth daily. Desiree Minium, MD  Active   losartan (COZAAR) 25 MG  tablet [id number]  Take 12.5 mg by mouth daily. [provider]  Active Self           Med Note Luciana Axe, Wendall Papa   Thu Jul 09, 2020 10:39 AM)    metoCLOPramide (REGLAN) 10 MG tablet [id number]  Take 1 tablet (10 mg total) by mouth 4 (four) times daily. Desiree Minium, MD  Active   ondansetron [facility]) 4 MG tablet [id number]  Take 1 tablet (4 mg total) by mouth every 8 (eight) hours as needed for nausea or vomiting. Desiree Minium, MD  Active Self  pantoprazole (PROTONIX) 40 MG tablet [id number]  Take 1 tablet (40 mg total) by mouth 2 (two) times daily. Desiree Minium, MD  Active   polyethylene glycol [facility] / GLYCOLAX) packet [id number]  Take 17 g by mouth daily as needed for moderate constipation (MIX AND DRINK). [provider]  Active Self  rosuvastatin (CRESTOR) 20 MG tablet [id number]  TAKE 1 TABLET EVERY DAY AT 6 PM Desiree Minium, MD  Active   torsemide (DEMADEX) 20 MG tablet [id number]  Take 1 tablet (20 mg total) by mouth 2 (two) times daily. Desiree Minium, MD  Active   TRUEplus Lancets 33G [place] [id number]  Use as directed Desiree Minium, MD  Active Self          Patient Active Problem List   Diagnosis Date Noted  . Localized osteoporosis without current pathological fracture 05/01/2020  . Hyperparathyroidism ([place]) 12/13/2019  . Elevated hemoglobin ([place]) 12/13/2019  . Chronic renal impairment, stage 4 (severe) ([place]) 10/28/2019  . Anemia 10/28/2019  . Physical exam 06/26/2019  . Neck pain 04/18/2019  . Ascending aortic aneurysm ([place]) 04/17/2019  . Hypoxia   . Nonspecific chest pain 04/15/2019  . Neuropathic pain of right flank 12/27/2018  . Dysphagia 12/27/2018  . Postsurgical hypothyroidism 06/11/2018  . Vitamin D deficiency 06/11/2018  . Multinodular goiter 06/11/2018  . Hypercalcemia 06/11/2018  . DM type 2 causing  vascular disease ([place])   . PONV (postoperative nausea and vomiting)   . Peripheral neuropathy   . Irritable bowel  syndrome   . Hypertension   . History of right mastectomy   . H/O hiatal hernia   . GERD (gastroesophageal reflux disease)   . Family history of anesthesia complication   . Diabetes mellitus type 2, uncontrolled, with complications ([place])   . Lower extremity edema   . Chronic constipation 06/23/2015  . Morbid obesity ([place]) 06/12/2015  . Chronic diastolic heart failure ([place]) 11/17/2014  . Herniated nucleus pulposus, L2-3 right 02/04/2014    Class: Acute  . Spondylolisthesis of lumbar region 04/30/2012    Class: Chronic  . Lumbar stenosis with neurogenic claudication 04/30/2012    Class: Chronic  . SYNCOPE-CAROTID SINUS 09/16/2008  . OSTEOARTHRITIS 08/23/2006  . DDD (degenerative disc disease), cervical 08/23/2006  . [place] DISEASE, LUMBAR 08/23/2006  . Headache(784.0) 08/23/2006  . BREAST CANCER, HX OF 08/23/2006  . HYPERPARATHYROIDISM, HX OF 08/23/2006  . RENAL CALCULUS, HX OF 08/23/2006    Immunization History  Administered Date(s) Administered  . Fluad Quad(high Dose 65+) 12/13/2019  . Influenza Split 01/31/2011  . Influenza Whole 02/23/2007  . Influenza, High Dose Seasonal PF 01/10/2017  . Influenza,inj,Quad PF,6+ Mos 01/07/2014, 02/09/2016, 01/01/2018, 12/03/2018  . Influenza-Unspecified 01/10/2012  . PFIZER(Purple Top)SARS-COV-2 Vaccination 05/24/2019, 06/18/2019, 01/11/2020  . Pneumococcal Conjugate-13 05/16/2019  . Pneumococcal Polysaccharide-23 08/25/2014  . Tdap 04/20/2011    Conditions to be addressed/monitored: Hypertension, Hyperlipidemia, Diabetes, GERD, COPD, Chronic Kidney Disease and Osteoporosis  Care Plan : [place]  Updates made by [name], [facility] since 08/31/2020 12:00 AM    Problem: Hypertension, Hyperlipidemia, Diabetes, GERD, COPD, Chronic Kidney Disease and Osteoporosis   Priority: High    Long-Range Goal: Disease management   Start Date: 08/31/2020  Expected End Date: 08/31/2021  This Visit's Progress: On track  Priority: High   Note:   Pharmacist Clinical Goal(s):  [name] Kitchen Patient will achieve adherence to monitoring guidelines and medication adherence to achieve therapeutic efficacy . contact provider office for questions/concerns as evidenced notation of same in electronic health record through collaboration with PharmD and provider.   Interventions: . 1:1 collaboration with Desiree Minium, MD regarding development and update of comprehensive plan of care as evidenced by provider attestation and co-signature . Inter-disciplinary care team collaboration (see longitudinal plan of care) . Comprehensive medication review performed; medication list updated in electronic medical record  Hypertension (BP goal <130/80) -Controlled -At times not taking torsemide when leaving the house due to increased urination. As of today is not using carvedilol or amlodipine  -Current treatment: . Hydralazine 50 mg three times daily . Losartan 25 mg once daily (?) (Dr Royce Macadamia now per tp) . Torsemide 20 mg BID -Meds needing clarifcation: amlodipine, carvedilol -Current home readings: at goal -Current exercise habits: no formal exercise - limited due to chronic pain - planing on getting spinal cord stimulator  -Denies hypotensive/hypertensive symptoms -Educated on Daily salt intake goal < 2300 mg; Importance of home blood pressure monitoring; -Reviewed importance of being consistent with torsemide regimen  -Counseled to monitor BP at home 1-2x/week, document, and provide log at future appointments -Recommended to continue current medication  CPA to contact Dr Royce Macadamia to clarify what they have for patients med list and what patient should be taking for blood pressure - as of today not taking amlodipine or carvedilol  Had some confusion with previously scheduled sleep study - planning to reschedule  Recently referred  to cardiology -   Osteoporosis (goal prevent fractures) -Controlled -Reports to be using consistently without issue   -Current regimen:  Alendronate 70 mg once weekly -Reviewed side effects - no problems noted  -Last DEXA 03/2020 - osteoporosis  Hyperlipidemia: (LDL goal < 70) -Controlled based off of 05/2020 labs -Several cardiac risk factors  -Current treatment: . Rosuvastatin 20 mg once daily (90 DS sent 4/22) -Reviewed adherence with patient - reports consistent use. Denies any recent side effects of problems -Educated on Cholesterol goals;  Benefits of statin for ASCVD risk reduction; -Recommended to continue current medication  -Per Dr Stanford Breed pt will need f/u imagining march 2023 on thoracic aortic aneursym   GERD (Goal: minimize symptoms, optimize medication safety) -Controlled -Daily ASA -Some ongoing symptoms but feels controlled well overall. -Several questions about metoclopramide and pantoprazole - reviewed at length with patient, all questions answered. -Current treatment:  Pantoprazole 40 mg twice daily - 1 tablet twice daily   Metoclopramide 10 mg four times daily  -Dietary triggers reviewed at length  -No problems with current regimen per patient. No side effects noted -Recommended continue current medications  Care management -reviewed each medication closely with patient - updated based off of information EHR and patient report  Patient Goals/Self-Care Activities . Patient will:  - target a minimum of 150 minutes of moderate intensity exercise weekly engage in dietary modifications by minimizing snacking   Follow Up Plan: Telephone follow up appointment with care management team member scheduled for: Medication Assistance: None required.  Patient affirms current coverage meets needs.     Patient's preferred pharmacy is:  [id number] [place] [place]Phone: [phone]: [phone] [place] [address] [place]Phone: [phone]: [phone] Follow Up:  Patient agrees to Care Plan and Follow-up.  Plan: 1 month f/u - minimize polypharmacy, reconcile BP meds/confirm with Dr FRoyce Macadamia Future Appointments  Date Time Provider [place] 09/11/2020 11:00 AM Desiree, RTomasa Blase RN [facility] None  09/29/2020  1:30 PM RBrita Romp NP RE[facility]A None   JMadelin Pratt PharmD, [name] Clinical Pharmacist Practitioner  [facility]  [phone]

## 2020-08-27 DIAGNOSIS — G8929 Other chronic pain: Secondary | ICD-10-CM | POA: Diagnosis not present

## 2020-08-27 DIAGNOSIS — Z01812 Encounter for preprocedural laboratory examination: Secondary | ICD-10-CM | POA: Diagnosis not present

## 2020-08-31 DIAGNOSIS — R809 Proteinuria, unspecified: Secondary | ICD-10-CM | POA: Diagnosis not present

## 2020-08-31 NOTE — Progress Notes (Signed)
Patient's nephrologist office contacted, active medication list to be faxed to [phone] at the attn of [name].

## 2020-08-31 NOTE — Patient Instructions (Signed)
Ms. Mclaurin,  Thank you for talking with me today. I have included our care plan/goals in the following pages.   Please review and call me at [phone] with any questions.  Thanks! Ellin Mayhew, PharmD, [name] Clinical Pharmacist Practitioner  [facility]  [phone]   Care Plan : [place]  Updates made by [name], [facility] since 08/31/2020 12:00 AM    Problem: Hypertension, Hyperlipidemia, Diabetes, GERD, COPD, Chronic Kidney Disease and Osteoporosis   Priority: High    Long-Range Goal: Disease management   Start Date: 08/31/2020  Expected End Date: 08/31/2021  This Visit's Progress: On track  Priority: High  Note:   Pharmacist Clinical Goal(s):  [name] Kitchen Patient will achieve adherence to monitoring guidelines and medication adherence to achieve therapeutic efficacy . contact provider office for questions/concerns as evidenced notation of same in electronic health record through collaboration with PharmD and provider.   Interventions: . 1:1 collaboration with [name], MD regarding development and update of comprehensive plan of care as evidenced by provider attestation and co-signature . Inter-disciplinary care team collaboration (see longitudinal plan of care) . Comprehensive medication review performed; medication list updated in electronic medical record  Hypertension (BP goal <130/80) -Controlled -At times not taking torsemide when leaving the house due to increased urination. As of today is not using carvedilol or amlodipine  -Current treatment: . Hydralazine 50 mg three times daily . Losartan 25 mg once daily (?) (Dr Royce Macadamia now per tp) . Torsemide 20 mg BID -Meds needing clarifcation: amlodipine, carvedilol -Current home readings: at goal -Current exercise habits: no formal exercise - limited due to chronic pain - planing on getting spinal cord stimulator  -Denies hypotensive/hypertensive symptoms -Educated on Daily salt  intake goal < 2300 mg; Importance of home blood pressure monitoring; -Reviewed importance of being consistent with torsemide regimen  -Counseled to monitor BP at home 1-2x/week, document, and provide log at future appointments -Recommended to continue current medication  CPA to contact Dr Royce Macadamia to clarify what they have for patients med list and what patient should be taking for blood pressure - as of today not taking amlodipine or carvedilol  Had some confusion with previously scheduled sleep study - planning to reschedule  Recently referred to cardiology -   Osteoporosis (goal prevent fractures) -Controlled -Reports to be using consistently without issue  -Current regimen:  Alendronate 70 mg once weekly -Reviewed side effects - no problems noted  -Last DEXA 03/2020 - osteoporosis  Hyperlipidemia: (LDL goal < 70) -Controlled based off of 05/2020 labs -Several cardiac risk factors  -Current treatment: . Rosuvastatin 20 mg once daily (90 DS sent 4/22) -Reviewed adherence with patient - reports consistent use. Denies any recent side effects of problems -Educated on Cholesterol goals;  Benefits of statin for ASCVD risk reduction; -Recommended to continue current medication  -Per Dr Stanford Breed pt will need f/u imagining march 2023 on thoracic aortic aneursym   GERD (Goal: minimize symptoms, optimize medication safety) -Controlled -Daily ASA -Some ongoing symptoms but feels controlled well overall. -Several questions about metoclopramide and pantoprazole - reviewed at length with patient, all questions answered. -Current treatment:  Pantoprazole 40 mg twice daily - 1 tablet twice daily   Metoclopramide 10 mg four times daily  -Dietary triggers reviewed at length  -No problems with current regimen per patient. No side effects noted -Recommended continue current medications  Care management -reviewed each medication closely with patient - updated based off of information EHR and  patient report  Patient Goals/Self-Care Activities . Patient will:  - target a minimum of 150 minutes of moderate intensity exercise weekly engage in dietary modifications by minimizing snacking   Follow Up Plan: Telephone follow up appointment with care management team member scheduled for: Medication Assistance: None required.  Patient affirms current coverage meets needs.    The patient verbalized understanding of instructions provided today and agreed to receive a mychart copy of patient instruction and/or educational materials. Telephone follow up appointment with pharmacy team member scheduled for: See next appointment with "Care Management Staff" under "What's Next" below.

## 2020-09-03 DIAGNOSIS — G894 Chronic pain syndrome: Secondary | ICD-10-CM | POA: Diagnosis not present

## 2020-09-03 DIAGNOSIS — M545 Low back pain, unspecified: Secondary | ICD-10-CM | POA: Diagnosis not present

## 2020-09-03 DIAGNOSIS — M4326 Fusion of spine, lumbar region: Secondary | ICD-10-CM | POA: Diagnosis not present

## 2020-09-03 DIAGNOSIS — G5790 Unspecified mononeuropathy of unspecified lower limb: Secondary | ICD-10-CM | POA: Diagnosis not present

## 2020-09-03 DIAGNOSIS — M960 Pseudarthrosis after fusion or arthrodesis: Secondary | ICD-10-CM | POA: Diagnosis not present

## 2020-09-03 DIAGNOSIS — M961 Postlaminectomy syndrome, not elsewhere classified: Secondary | ICD-10-CM | POA: Diagnosis not present

## 2020-09-03 DIAGNOSIS — E119 Type 2 diabetes mellitus without complications: Secondary | ICD-10-CM | POA: Diagnosis not present

## 2020-09-03 DIAGNOSIS — M4716 Other spondylosis with myelopathy, lumbar region: Secondary | ICD-10-CM | POA: Diagnosis not present

## 2020-09-04 DIAGNOSIS — E119 Type 2 diabetes mellitus without complications: Secondary | ICD-10-CM | POA: Diagnosis not present

## 2020-09-04 DIAGNOSIS — Z981 Arthrodesis status: Secondary | ICD-10-CM | POA: Diagnosis not present

## 2020-09-04 DIAGNOSIS — Z462 Encounter for fitting and adjustment of other devices related to nervous system and special senses: Secondary | ICD-10-CM | POA: Diagnosis not present

## 2020-09-04 DIAGNOSIS — M4716 Other spondylosis with myelopathy, lumbar region: Secondary | ICD-10-CM | POA: Diagnosis not present

## 2020-09-04 DIAGNOSIS — G894 Chronic pain syndrome: Secondary | ICD-10-CM | POA: Diagnosis not present

## 2020-09-04 DIAGNOSIS — Z969 Presence of functional implant, unspecified: Secondary | ICD-10-CM | POA: Diagnosis not present

## 2020-09-04 DIAGNOSIS — M961 Postlaminectomy syndrome, not elsewhere classified: Secondary | ICD-10-CM | POA: Diagnosis not present

## 2020-09-04 DIAGNOSIS — M545 Low back pain, unspecified: Secondary | ICD-10-CM | POA: Diagnosis not present

## 2020-09-04 DIAGNOSIS — G5790 Unspecified mononeuropathy of unspecified lower limb: Secondary | ICD-10-CM | POA: Diagnosis not present

## 2020-09-04 DIAGNOSIS — M4326 Fusion of spine, lumbar region: Secondary | ICD-10-CM | POA: Diagnosis not present

## 2020-09-09 ENCOUNTER — Other Ambulatory Visit: Payer: Self-pay

## 2020-09-09 NOTE — Patient Outreach (Signed)
[facility]) Care Management  09/09/2020  [name] 03/09/1951 [id number]   Telephone Assessment   Unsuccessful outreach attempt to patient. No answer after multiple rings and unable to leave message.     Plan: RN CM will make outreach attempt to patient within 3-4 wks if no return call from patient.    Enzo Montgomery, RN,BSN,CCM [place] [facility] Telephonic Care Management Coordinator Direct Phone: [phone] Toll Free: [phone] Fax: [phone]

## 2020-09-10 ENCOUNTER — Other Ambulatory Visit: Payer: Self-pay

## 2020-09-10 NOTE — Patient Outreach (Signed)
[facility]) Care Management  09/10/2020  [name] Mar 22, 1951 [id number]   Telephone Assessment Annual Assessment  Successful outreach attempt to patient. She reports she is doing fairly well and recovering from recent back surgery. He is hopeful that this will improve her pain and mobility. She ad surgeon follow up appt on yesterday and states appt went well. She denies any RN CM needs or concerns at this time.    Medications Reviewed Today    Reviewed by Hayden Pedro, RN (Registered Nurse) on 09/10/20 at 1215  Med List Status: <None>  Medication Order Taking? Sig Documenting Provider Last Dose Status Informant  acetaminophen (TYLENOL) 500 MG tablet [id number] No Take 500-1,000 mg by mouth every 6 (six) hours as needed for moderate pain, headache or mild pain. [provider] Taking Active Self  Alcohol Swabs (B-D SINGLE USE SWABS REGULAR) PADS [id number] No Use as directed. [name], MD Taking Active Self  alendronate (FOSAMAX) 70 MG tablet [id number] No Take 1 tablet (70 mg total) by mouth once a week. Take with a full glass of water on an empty stomach. [name], MD Taking Active   amLODipine (NORVASC) 10 MG tablet [id number] No TAKE 1 TABLET BY MOUTH EVERY DAY  Patient not taking: Reported on 08/26/2020   Susy Frizzle, MD Not Taking Active Self  aspirin 81 MG tablet [id number] No Take 81 mg by mouth daily. [provider] Taking Active Self  Blood Glucose Monitoring Suppl (TRUE METRIX AIR GLUCOSE METER) w/Device KIT [id number] No 1 kit by Does not apply route 3 (three) times daily. [name], MD Taking Active Self  carvedilol (COREG) 6.25 MG tablet [id number] No Take 1 tablet (6.25 mg total) by mouth 2 (two) times daily with a meal.  Patient not taking: Reported on 08/26/2020   [name], MD Not Taking Active Self           Med Note Jeanann Lewandowsky Jul 09, 2020 10:38 AM)     clindamycin (CLEOCIN) 300 MG capsule [id number] No Take 300 mg by mouth every 6 (six) hours.  Patient not taking: Reported on 08/26/2020   [provider] Not Taking Active Self           Med Note Jeanann Lewandowsky Jul 09, 2020 10:38 AM)    Continuous Blood Gluc Sensor (FREESTYLE LIBRE 14 DAY SENSOR) [place] [id number] No Inject 1 each into the skin every 14 (fourteen) days. Use as directed. Brita Romp, NP Taking Active Self  ferrous gluconate (FERGON) 324 MG tablet [id number] No TAKE 1 TABLET (324 MG TOTAL) BY MOUTH 2 (TWO) TIMES DAILY WITH A MEAL. Julian Hy, DO Taking Active Self  glipiZIDE (GLUCOTROL XL) 5 MG 24 hr tablet [id number] No TAKE 1 TABLET BY MOUTH EVERY DAY WITH BREAKFAST Brita Romp, NP Taking Active   glucose blood (TRUE METRIX BLOOD GLUCOSE TEST) test strip [id number] No Use as instructed [name], MD Taking Active Self  hydrALAZINE (APRESOLINE) 50 MG tablet [id number] No Take 50 mg by mouth 3 (three) times daily. [provider] Taking Active Self  insulin NPH-regular Human (NOVOLIN 70/30 RELION) (70-30) 100 UNIT/ML injection [id number] No Inject 40 Units into the skin See admin instructions. Inject 40 units into the skin into the skin in the morning and in the evening [provider] Taking Active Self  levothyroxine (SYNTHROID) 100 MCG tablet [id number] No Take 1 tablet (100  mcg total) by mouth daily. [name], MD Taking Active   losartan (COZAAR) 25 MG tablet [id number] No Take 25 mg by mouth daily. [provider] Taking Active Self           Med Note Luciana Axe, Wendall Papa   Thu Jul 09, 2020 10:39 AM)    metoCLOPramide (REGLAN) 10 MG tablet [id number] No Take 1 tablet (10 mg total) by mouth 4 (four) times daily. [name], MD Taking Active   ondansetron [facility]) 4 MG tablet [id number] No Take 1 tablet (4 mg total) by mouth every 8 (eight) hours as needed for nausea or vomiting. [name], MD Taking  Active Self  pantoprazole (PROTONIX) 40 MG tablet [id number] No Take 1 tablet (40 mg total) by mouth 2 (two) times daily. [name], MD Taking Active   polyethylene glycol [facility] / GLYCOLAX) packet [id number] No Take 17 g by mouth daily as needed for moderate constipation (MIX AND DRINK). [provider] Taking Active Self  rosuvastatin (CRESTOR) 20 MG tablet [id number] No TAKE 1 TABLET EVERY DAY AT 6 PM [name], MD Taking Active   torsemide (DEMADEX) 20 MG tablet [id number] No Take 1 tablet (20 mg total) by mouth 2 (two) times daily. [name], MD Taking Active   TRUEplus Lancets 33G MISC [id number] No Use as directed [name], MD Taking Active Self           Fall Risk  09/10/2020 07/17/2020 06/25/2020 06/24/2020 04/29/2020  Falls in the past year? 0 0 0 0 0  Number falls in past yr: 0 0 0 0 0  Injury with Fall? 0 0 0 0 0  Risk for fall due to : Impaired balance/gait;Impaired mobility;Medication side effect No Fall Risks No Fall Risks Impaired balance/gait No Fall Risks  Follow up Education provided;Falls evaluation completed - - Falls evaluation completed;Education provided -    Depression screen [facility] 2/9 09/10/2020 07/17/2020 06/25/2020 04/29/2020 04/16/2020  Decreased Interest 0 0 0 0 0  Down, Depressed, Hopeless 0 0 0 0 0  PHQ - 2 Score 0 0 0 0 0  Altered sleeping - 0 0 0 0  Tired, decreased energy - 0 0 0 0  Change in appetite - 0 0 0 0  Feeling bad or failure about yourself  - 0 0 0 0  Trouble concentrating - 0 0 0 0  Moving slowly or fidgety/restless - 0 0 0 0  Suicidal thoughts - 0 0 0 0  PHQ-9 Score - 0 0 0 0  Difficult doing work/chores - Not difficult at all Not difficult at all Not difficult at all Not difficult at all  Some recent data might be hidden    SDOH Screenings   Alcohol Screen: Not on file  Depression (PHQ2-9): Low Risk   . PHQ-2 Score: 0  Financial Resource Strain: Not on file  Food Insecurity: No Food Insecurity  .  Worried About Charity fundraiser in the Last Year: Never true  . Ran Out of Food in the Last Year: Never true  Housing: Low Risk   . Last Housing Risk Score: 0  Physical Activity: Not on file  Social Connections: Not on file  Stress: Not on file  Tobacco Use: Low Risk   . Smoking Tobacco Use: Never Smoker  . Smokeless Tobacco Use: Never Used  Transportation Needs: No Transportation Needs  . Lack of Transportation (Medical): No  . Lack of Transportation (Non-Medical): No  Goals Addressed              This Visit's Progress   .  (THN)Effective Breathing Pattern During Sleep (pt-stated)         Timeframe:  Long-Range Goal Priority:  High Start Date:   06/24/2020                          Expected End Date: Sept 2022 Follow Up Date: Sept 2022     Barriers: Health Behaviors Knowledge   -patient will report any abnormal sleeping patterns to MD -patient will complete MD appts and further workup/testing as ordered                     Evidence-based guidance:   Use a validated screening tool to identify risk for obstructive sleep apnea (OSA).   Encourage a nonsupine sleep position, such as side-lying, head of bed elevated, multiple pillows, to prevent airway collapse.   Encourage the use of sleep hygiene techniques.   Anticipate a referral for sleep study (polysomnography).   If OSA is identified, prepare patient for treatment options, such as nighttime oxygen therapy and CPAP (continuous positive airway pressure) or BiPAP (bilevel positive airway pressure).   Notes:   06/24/2020 Patient reports she was recently diagnosed with sleep apnea. She voices that she sleeps well at night and had no sxs. She saw pulmonologist on today for eval. She has been advised to undergo further workup/testing and complete sleep study. 09/10/2020-She has not had sleep study yet as she has ben more focused on getting recent back surgery completed. She will follow up with lung MD.    .   (THN)Keep Pain Under Control (pt-stated)        Timeframe:  Long-Range Goal Priority:  High Start Date: 02/18/2020                             Expected End Date:  JSept 2022                    Follow Up Date Sept2022    Barriers: Health Behaviors Knowledge  -patient will report any worsening sxs to MD -patient will adhere to MD appt scheduled    Why is this important?   Day-to-day life can be hard when you have back pain.  Pain medicine is just one piece of the treatment puzzle. There are many things you can do to manage pain and keep your back strong.   Lifestyle changes, like stopping smoking and eating foods with Vitamin D and calcium, keep your bones and muscles healthy. Your back is better when it is supported by strong muscles.  You can try these action steps to help you manage your pain.     Notes:  04/21/20-Patient going to see another back specialist for second opinion today 06/24/2020-Patient reports she continues to see pain MD. It has been reccommended that she do a trial back stimulator and is undergoing workup for that.   09/10/2020-Patient had back surgery and stimulator placed on 09/03/20. She states she is recovering. She still has some pain and swelling to site but overall feels like her pain is improved. She has only really been taking occasional Tylenol. She is walking with walker.     .  (THN)Monitor and Manage My Blood Sugar (pt-stated)        Timeframe:  Long-Range Goal Priority:  High  Start Date:  02/18/2020                           Expected End Date:    Sept 2022                  Follow Up Date Sept2022  Barriers: Health Behaviors Knowledge    -patient will monitor cbgs more frequently in the home -patient will adhere to diabetic diet at least 75-100% of the time.   Why is this important?   Checking your blood sugar at home helps to keep it from getting very high or very low.  Writing the results in a diary or log helps the doctor know how to care for  you.  Your blood sugar log should have the time, date and the results.  Also, write down the amount of insulin or other medicine that you take.  Other information, like what you ate, exercise done and how you were feeling, will also be helpful.     Notes:  1/11/222-Patient admits that she has Skipped" checking cbgs and taking insulin at times as she is tired of sticking herself. RN CM reinforced adherence measures.  06/24/2020-Patient reports she is trying to adhere to cbg monitoring-needs reinforcement. Last A1C on file 7.9(Feb 2022)  09/10/2020-No recent labwork. Patient reports cbgs have been running in the high 100s to mid 200s. She adits that appetite is decreased-eating smaller meals/snacks.    .  (THN)Set My Target A1C (pt-stated)        Timeframe:  Long-Range Goal Priority:  Medium Start Date:    02/18/2020                         Expected End Date:  Sept 2022                    Follow Up Date Sept 2022    Barriers: Health Behaviors Knowledge  - set target A1C-maintain level of 7.0 or less  -patient will monitor cbgs more frequently in the home -patient will adhere to diabetic diet at least 75-100% of the time.   Why is this important?   Your target A1C is decided together by you and your doctor.  It is based on several things like your age and other health issues.    Notes:  04/21/20-Most recent A1C on file 8.8(Nov 2021) 06/24/2020-Most recent A1C level7.9(Feb 2022) 09/10/20-Patient due for A1C testing-last reading 7.9 in Feb.        Plan: RN CM discussed with patient next outreach within the month of Sept . Patient gave verbal consent and in agreement with RN CM follow up and timeframe. Patient aware that they may contact RN CM sooner for any issues or concerns. RN CM reviewed goals and plan of care with patient. Patient agrees to care plan and follow up. RN CM will send quarterly update to PCP. Enzo Montgomery, RN,BSN,CCM [facility] Telephonic Care  Management Coordinator Direct Phone: [phone] Toll Free: [phone] Fax: [phone]

## 2020-09-11 ENCOUNTER — Ambulatory Visit: Payer: Self-pay

## 2020-09-20 ENCOUNTER — Other Ambulatory Visit: Payer: Self-pay | Admitting: Family Medicine

## 2020-09-22 ENCOUNTER — Ambulatory Visit: Payer: Medicare HMO | Admitting: Nurse Practitioner

## 2020-09-24 ENCOUNTER — Telehealth: Payer: Self-pay

## 2020-09-24 NOTE — Chronic Care Management (AMB) (Signed)
Chronic Care Management Pharmacy Assistant   Name: [name]  MRN: [id number] DOB: 13-Mar-1951  Reason for Encounter:Diabetes Mellitus Disease State call   Recent office visits:  None  Recent consult visits:  09/03/20-William Owings MD (surgical services)- Thoracic Laminectomy for Spinal cord Stimulator lead and insertion of internal pulse generator.    Hospital visits:  None in previous 6 months  Medications: Outpatient Encounter Medications as of 09/24/2020  Medication Sig   acetaminophen (TYLENOL) 500 MG tablet Take 500-1,000 mg by mouth every 6 (six) hours as needed for moderate pain, headache or mild pain.   Alcohol Swabs (B-D SINGLE USE SWABS REGULAR) PADS Use as directed.   alendronate (FOSAMAX) 70 MG tablet Take 1 tablet (70 mg total) by mouth once a week. Take with a full glass of water on an empty stomach.   amLODipine (NORVASC) 10 MG tablet TAKE 1 TABLET BY MOUTH EVERY DAY (Patient not taking: Reported on 08/26/2020)   aspirin 81 MG tablet Take 81 mg by mouth daily.   Blood Glucose Monitoring Suppl (TRUE METRIX AIR GLUCOSE METER) w/Device KIT 1 kit by Does not apply route 3 (three) times daily.   carvedilol (COREG) 6.25 MG tablet Take 1 tablet (6.25 mg total) by mouth 2 (two) times daily with a meal. (Patient not taking: Reported on 08/26/2020)   clindamycin (CLEOCIN) 300 MG capsule Take 300 mg by mouth every 6 (six) hours. (Patient not taking: Reported on 08/26/2020)   Continuous Blood Gluc Sensor (FREESTYLE LIBRE 14 DAY SENSOR) MISC Inject 1 each into the skin every 14 (fourteen) days. Use as directed.   ferrous gluconate (FERGON) 324 MG tablet TAKE 1 TABLET (324 MG TOTAL) BY MOUTH 2 (TWO) TIMES DAILY WITH A MEAL.   glipiZIDE (GLUCOTROL XL) 5 MG 24 hr tablet TAKE 1 TABLET BY MOUTH EVERY DAY WITH BREAKFAST   glucose blood (TRUE METRIX BLOOD GLUCOSE TEST) test strip Use as instructed   hydrALAZINE (APRESOLINE) 50 MG tablet Take 50 mg by mouth 3 (three) times daily.    insulin NPH-regular Human (NOVOLIN 70/30 RELION) (70-30) 100 UNIT/ML injection Inject 40 Units into the skin See admin instructions. Inject 40 units into the skin into the skin in the morning and in the evening   levothyroxine (SYNTHROID) 100 MCG tablet Take 1 tablet (100 mcg total) by mouth daily.   losartan (COZAAR) 25 MG tablet Take 25 mg by mouth daily.   metoCLOPramide (REGLAN) 10 MG tablet Take 1 tablet (10 mg total) by mouth 4 (four) times daily.   ondansetron (ZOFRAN) 4 MG tablet Take 1 tablet (4 mg total) by mouth every 8 (eight) hours as needed for nausea or vomiting.   pantoprazole (PROTONIX) 40 MG tablet Take 1 tablet (40 mg total) by mouth 2 (two) times daily.   polyethylene glycol (MIRALAX / GLYCOLAX) packet Take 17 g by mouth daily as needed for moderate constipation (MIX AND DRINK).   rosuvastatin (CRESTOR) 20 MG tablet TAKE 1 TABLET EVERY DAY AT 6 PM   torsemide (DEMADEX) 20 MG tablet TAKE 1 TABLET BY MOUTH TWICE A DAY   TRUEplus Lancets 33G MISC Use as directed   No facility-administered encounter medications on file as of 09/24/2020.   Recent Relevant Labs: Lab Results  Component Value Date/Time   HGBA1C 7.9 (A) 06/08/2020 11:17 AM   HGBA1C 8.8 (A) 03/09/2020 11:08 AM   MICROALBUR 570.4 08/05/2019 03:15 PM   MICROALBUR 188.7 06/01/2018 03:25 PM    Kidney Function Lab Results  Component  Value Date/Time   CREATININE 2.05 (H) 07/06/2020 04:49 PM   CREATININE 2.01 (H) 06/02/2020 08:16 AM   CREATININE 1.68 (H) 10/28/2019 01:32 PM   CREATININE 1.64 (H) 09/19/2019 02:20 PM   CREATININE 1.55 (H) 08/05/2019 03:15 PM   GFR 36.33 (L) 10/28/2019 01:32 PM   GFRNONAA 25 (L) 07/06/2020 04:49 PM   GFRNONAA 31 (L) 09/19/2019 02:20 PM   GFRNONAA 38 (L) 03/01/2019 02:07 PM   GFRAA 25 (L) 10/19/2019 03:30 AM   GFRAA 36 (L) 09/19/2019 02:20 PM   GFRAA 44 (L) 03/01/2019 02:07 PM    Current antihyperglycemic regimen:  Glipizide 83m take 1 daily Novolin 70/30 Inject 40 units twice  daily What recent interventions/DTPs have been made to improve glycemic control:  None Have there been any recent hospitalizations or ED visits since last visit with [name]? No Patient denies hypoglycemic symptoms, including Pale, Sweaty, Shaky, Hungry, Nervous/irritable, and Vision changes Patient denies hyperglycemic symptoms, including blurry vision, excessive thirst, fatigue, polyuria, and weakness How often are you checking your blood sugar? twice daily What are your blood sugars ranging?  Fasting: 200 Before meals: 200 After meals: Doesn't check Bedtime: 200 During the week, how often does your blood glucose drop below 70? Never Are you checking your feet daily/regularly? Patient checks feet daily.   Adherence Review: Is the patient currently on a STATIN medication? Yes Is the patient currently on ACE/ARB medication? Yes Does the patient have >5 day gap between last estimated fill dates? Yes  Spoke to patient. She reports her recent BS readings are 200 before meals.  She does not check her BS after meals.  She confirms she is taking Glipizide and Insulin at 40 units twice daily.  Discussed the last fill dates and she stated she has her meds filled regularly and has medications on hand.    Star Rating Drugs:  Glipizide 553mLast filled 05/08/20  90DS Losartan 25100mLast filled 12/27/19  90 DS Rosuvastatin 2m80mst filled 02/25/20  90 DS  [facility]

## 2020-09-29 ENCOUNTER — Ambulatory Visit: Payer: Medicare HMO | Admitting: Nurse Practitioner

## 2020-10-07 ENCOUNTER — Encounter: Payer: Self-pay | Admitting: *Deleted

## 2020-10-13 DIAGNOSIS — G894 Chronic pain syndrome: Secondary | ICD-10-CM | POA: Diagnosis not present

## 2020-10-23 ENCOUNTER — Telehealth: Payer: Self-pay

## 2020-10-23 DIAGNOSIS — E890 Postprocedural hypothyroidism: Secondary | ICD-10-CM | POA: Diagnosis not present

## 2020-10-23 NOTE — Chronic Care Management (AMB) (Signed)
Chronic Care Management Pharmacy Assistant   Name: SHARMIN FOULK  MRN: [id number] DOB: 1951/02/11  Reason for Encounter: Diabetes mellitus Disease State call   Recent office visits:  None  Recent consult visits:  None  Hospital visits:  None in previous 6 months  Medications: Outpatient Encounter Medications as of 10/23/2020  Medication Sig   acetaminophen (TYLENOL) 500 MG tablet Take 500-1,000 mg by mouth every 6 (six) hours as needed for moderate pain, headache or mild pain.   Alcohol Swabs (B-D SINGLE USE SWABS REGULAR) PADS Use as directed.   alendronate (FOSAMAX) 70 MG tablet Take 1 tablet (70 mg total) by mouth once a week. Take with a full glass of water on an empty stomach.   amLODipine (NORVASC) 10 MG tablet TAKE 1 TABLET BY MOUTH EVERY DAY (Patient not taking: Reported on 08/26/2020)   aspirin 81 MG tablet Take 81 mg by mouth daily.   Blood Glucose Monitoring Suppl (TRUE METRIX AIR GLUCOSE METER) w/Device KIT 1 kit by Does not apply route 3 (three) times daily.   carvedilol (COREG) 6.25 MG tablet Take 1 tablet (6.25 mg total) by mouth 2 (two) times daily with a meal. (Patient not taking: Reported on 08/26/2020)   clindamycin (CLEOCIN) 300 MG capsule Take 300 mg by mouth every 6 (six) hours. (Patient not taking: Reported on 08/26/2020)   Continuous Blood Gluc Sensor (FREESTYLE LIBRE 14 DAY SENSOR) MISC Inject 1 each into the skin every 14 (fourteen) days. Use as directed.   ferrous gluconate (FERGON) 324 MG tablet TAKE 1 TABLET (324 MG TOTAL) BY MOUTH 2 (TWO) TIMES DAILY WITH A MEAL.   glipiZIDE (GLUCOTROL XL) 5 MG 24 hr tablet TAKE 1 TABLET BY MOUTH EVERY DAY WITH BREAKFAST   glucose blood (TRUE METRIX BLOOD GLUCOSE TEST) test strip Use as instructed   hydrALAZINE (APRESOLINE) 50 MG tablet Take 50 mg by mouth 3 (three) times daily.   insulin NPH-regular Human (NOVOLIN 70/30 RELION) (70-30) 100 UNIT/ML injection Inject 40 Units into the skin See admin instructions. Inject  40 units into the skin into the skin in the morning and in the evening   levothyroxine (SYNTHROID) 100 MCG tablet Take 1 tablet (100 mcg total) by mouth daily.   losartan (COZAAR) 25 MG tablet Take 25 mg by mouth daily.   metoCLOPramide (REGLAN) 10 MG tablet Take 1 tablet (10 mg total) by mouth 4 (four) times daily.   ondansetron (ZOFRAN) 4 MG tablet Take 1 tablet (4 mg total) by mouth every 8 (eight) hours as needed for nausea or vomiting.   pantoprazole (PROTONIX) 40 MG tablet Take 1 tablet (40 mg total) by mouth 2 (two) times daily.   polyethylene glycol (MIRALAX / GLYCOLAX) packet Take 17 g by mouth daily as needed for moderate constipation (MIX AND DRINK).   rosuvastatin (CRESTOR) 20 MG tablet TAKE 1 TABLET EVERY DAY AT 6 PM   torsemide (DEMADEX) 20 MG tablet TAKE 1 TABLET BY MOUTH TWICE A DAY   TRUEplus Lancets 33G MISC Use as directed   No facility-administered encounter medications on file as of 10/23/2020.   Recent Relevant Labs: Lab Results  Component Value Date/Time   HGBA1C 7.9 (A) 06/08/2020 11:17 AM   HGBA1C 8.8 (A) 03/09/2020 11:08 AM   MICROALBUR 570.4 08/05/2019 03:15 PM   MICROALBUR 188.7 06/01/2018 03:25 PM    Kidney Function Lab Results  Component Value Date/Time   CREATININE 2.05 (H) 07/06/2020 04:49 PM   CREATININE 2.01 (H) 06/02/2020 08:16 AM  CREATININE 1.68 (H) 10/28/2019 01:32 PM   CREATININE 1.64 (H) 09/19/2019 02:20 PM   CREATININE 1.55 (H) 08/05/2019 03:15 PM   GFR 36.33 (L) 10/28/2019 01:32 PM   GFRNONAA 25 (L) 07/06/2020 04:49 PM   GFRNONAA 31 (L) 09/19/2019 02:20 PM   GFRNONAA 38 (L) 03/01/2019 02:07 PM   GFRAA 25 (L) 10/19/2019 03:30 AM   GFRAA 36 (L) 09/19/2019 02:20 PM   GFRAA 44 (L) 03/01/2019 02:07 PM   Patient stated she is not doing well with her diabetes. Her fasting BS is normally in the 200's.  She has a decreased appetite.  I spoke with her at 2:20pm and she hasn't eaten at all today.  She has f/u appt next Wednesday with her  Endocrinologist.  Also she had a stimulator put in her back for back pain and its not working properly.   Current antihyperglycemic regimen:  Glipizide 79m take 1 daily Novolin 70/30 Inject 40 units twice daily What recent interventions/DTPs have been made to improve glycemic control:  None Have there been any recent hospitalizations or ED visits since last visit with [name]? No Patient denies hypoglycemic symptoms, including Pale, Sweaty, Shaky, Hungry, Nervous/irritable, and Vision changes Patient reports hyperglycemic symptoms, including excessive thirst How often are you checking your blood sugar? twice daily What are your blood sugars ranging?  Fasting: 220 Before meals: n/a After meals: n/a Bedtime: n/a During the week, how often does your blood glucose drop below 70? Never Are you checking your feet daily/regularly?   Adherence Review: Is the patient currently on a STATIN medication? Yes Is the patient currently on ACE/ARB medication? Yes Does the patient have >5 day gap between last estimated fill dates? No Novolin Last fill unavailable-patient confirmed she has Novolin on hand.   Star Rating Drugs: Glipizide 560m Last filled 05/18//22  90 DS Losartan 2522mLast filled 08/05/20  90DS Rosuvastatin 70m17mast filled 08/06/20  90 DS  Pharmacist review re: blood sugars  [facility]

## 2020-10-24 LAB — TSH: TSH: 0.417 u[IU]/mL — ABNORMAL LOW (ref 0.450–4.500)

## 2020-10-24 LAB — PTH, INTACT AND CALCIUM
Calcium: 10.9 mg/dL — ABNORMAL HIGH (ref 8.7–10.3)
PTH: 60 pg/mL (ref 15–65)

## 2020-10-24 LAB — T4, FREE: Free T4: 1.29 ng/dL (ref 0.82–1.77)

## 2020-10-28 ENCOUNTER — Other Ambulatory Visit: Payer: Self-pay

## 2020-10-28 ENCOUNTER — Ambulatory Visit (INDEPENDENT_AMBULATORY_CARE_PROVIDER_SITE_OTHER): Payer: Medicare HMO | Admitting: Nurse Practitioner

## 2020-10-28 ENCOUNTER — Encounter: Payer: Self-pay | Admitting: Nurse Practitioner

## 2020-10-28 VITALS — BP 168/81 | HR 59 | Ht 64.0 in | Wt 205.6 lb

## 2020-10-28 DIAGNOSIS — E890 Postprocedural hypothyroidism: Secondary | ICD-10-CM

## 2020-10-28 DIAGNOSIS — E1159 Type 2 diabetes mellitus with other circulatory complications: Secondary | ICD-10-CM | POA: Diagnosis not present

## 2020-10-28 DIAGNOSIS — I10 Essential (primary) hypertension: Secondary | ICD-10-CM

## 2020-10-28 DIAGNOSIS — E559 Vitamin D deficiency, unspecified: Secondary | ICD-10-CM | POA: Diagnosis not present

## 2020-10-28 DIAGNOSIS — E042 Nontoxic multinodular goiter: Secondary | ICD-10-CM | POA: Diagnosis not present

## 2020-10-28 LAB — POCT GLYCOSYLATED HEMOGLOBIN (HGB A1C): HbA1c, POC (controlled diabetic range): 9.9 % — AB (ref 0.0–7.0)

## 2020-10-28 NOTE — Progress Notes (Signed)
10/28/2020, 1:49 PM   Endocrinology follow-up note   Subjective:    Patient ID: [name], female    DOB: Oct 23, 1950.  [name] is being seen in follow-up for management of currently uncontrolled,complicated symptomatic type 2 diabetes and associated hypertension, hypothyroidism. PMD :  [name], MD.   Past Medical History:  Diagnosis Date   Anginal pain ([place]) 03/19/2012   saw Dr. Cathie Olden .. she thinks its more related to stomach issues   Arthritis    "all over; mostly in my back" (03/20/2012)   Breast cancer ([name])    "right" (03/20/2012)   Cardiomyopathy    Chest pain 06/2005   Hospitalized, dystolic dysfunction   Chronic lower back pain    "have 2 herniated discs; going to have to have a fusion" (03/20/2012)   CKD (chronic kidney disease), stage III ([place])    Family history of anesthesia complication    "father has nausea and vomiting"   Gallstones    GERD (gastroesophageal reflux disease)    H/O hiatal hernia    History of kidney stones    History of right mastectomy    Hypertension    "patient states never had HTN, takes Hyzaar for heart   Hypothyroidism    Irritable bowel syndrome    Migraines    "it's been a long time since I've had one" (03/20/2012)   Osteoarthritis    Peripheral neuropathy    PONV (postoperative nausea and vomiting)    Sciatica    Type II diabetes mellitus ([place])    Past Surgical History:  Procedure Laterality Date   ABDOMINAL HYSTERECTOMY  1993   adenosin cardiolite  07/2005   (+) wall motion abnormality   AXILLARY LYMPH NODE DISSECTION  2003   squamous cell cancer   BACK SURGERY     CARDIAC CATHETERIZATION  ? 2005 / 2007   CHOLECYSTECTOMY N/A 01/24/2018   Procedure: LAPAROSCOPIC CHOLECYSTECTOMY;  Surgeon: Erroll Luna, MD;  Location: [facility];  Service: General;  Laterality: N/A;   CT abdomen and pelvis  02/2010   Same    ESOPHAGOGASTRODUODENOSCOPY  2005   GERD   KNEE ARTHROSCOPY  1990's   "right" (03/20/2012)   LUMBAR FUSION N/A 08/22/2014   Procedure: Right L2-3 and right L1-2 transforaminal lumbar interbody fusion with pedicle screws, rods, sleeves, cages, local bone graft, Vivigen, cancellous chips;  Surgeon: Jessy Oto, MD;  Location: [facility];  Service: Orthopedics;  Laterality: N/A;   LUMBAR LAMINECTOMY/DECOMPRESSION MICRODISCECTOMY N/A 02/04/2014   Procedure: RIGHT L2-3 MICRODISCECTOMY ;  Surgeon: Jessy Oto, MD;  Location: [facility];  Service: Orthopedics;  Laterality: N/A;   MASTECTOMY Right    MASTECTOMY WITH AXILLARY LYMPH NODE DISSECTION  12/2003   "right" (03/20/2012)   POSTERIOR CERVICAL FUSION/FORAMINOTOMY  2001   SHOULDER ARTHROSCOPY W/ ROTATOR CUFF REPAIR  2003   Left   THYROIDECTOMY  1977   Right   TOTAL KNEE ARTHROPLASTY  2000   Right   [place] of abdomen  07/2005   Fatty liver / kidney stones   Social History   Socioeconomic History   Marital status: Married    Spouse name: Not  on file   Number of children: 1   Years of education: Not on file   Highest education level: Not on file  Occupational History   Occupation: Retired since 2003  Tobacco Use   Smoking status: Never   Smokeless tobacco: Never  Vaping Use   Vaping Use: Never used  Substance and Sexual Activity   Alcohol use: No   Drug use: No   Sexual activity: Not on file  Other Topics Concern   Not on file  Social History Narrative   Not on file   Social Determinants of Health   Financial Resource Strain: Not on file  Food Insecurity: No Food Insecurity   Worried About Running Out of Food in the Last Year: Never true   [place] in the Last Year: Never true  Transportation Needs: No Transportation Needs   Lack of Transportation (Medical): No   Lack of Transportation (Non-Medical): No  Physical Activity: Not on file  Stress: Not on file  Social Connections: Not on file   Outpatient Encounter  Medications as of 10/28/2020  Medication Sig   amLODipine (NORVASC) 10 MG tablet TAKE 1 TABLET BY MOUTH EVERY DAY   carvedilol (COREG) 6.25 MG tablet Take 1 tablet (6.25 mg total) by mouth 2 (two) times daily with a meal.   acetaminophen (TYLENOL) 500 MG tablet Take 500-1,000 mg by mouth every 6 (six) hours as needed for moderate pain, headache or mild pain.   Alcohol Swabs (B-D SINGLE USE SWABS REGULAR) PADS Use as directed.   alendronate (FOSAMAX) 70 MG tablet Take 1 tablet (70 mg total) by mouth once a week. Take with a full glass of water on an empty stomach.   aspirin 81 MG tablet Take 81 mg by mouth daily.   Blood Glucose Monitoring Suppl (TRUE METRIX AIR GLUCOSE METER) w/Device KIT 1 kit by Does not apply route 3 (three) times daily.   ferrous gluconate (FERGON) 324 MG tablet TAKE 1 TABLET (324 MG TOTAL) BY MOUTH 2 (TWO) TIMES DAILY WITH A MEAL.   glipiZIDE (GLUCOTROL XL) 5 MG 24 hr tablet TAKE 1 TABLET BY MOUTH EVERY DAY WITH BREAKFAST   glucose blood (TRUE METRIX BLOOD GLUCOSE TEST) test strip Use as instructed   hydrALAZINE (APRESOLINE) 50 MG tablet Take 50 mg by mouth 3 (three) times daily.   insulin NPH-regular Human (NOVOLIN 70/30 RELION) (70-30) 100 UNIT/ML injection Inject 45 Units into the skin 2 (two) times daily with a meal.   levothyroxine (SYNTHROID) 100 MCG tablet Take 1 tablet (100 mcg total) by mouth daily.   losartan (COZAAR) 25 MG tablet Take 25 mg by mouth daily.   metoCLOPramide (REGLAN) 10 MG tablet Take 1 tablet (10 mg total) by mouth 4 (four) times daily.   ondansetron (ZOFRAN) 4 MG tablet Take 1 tablet (4 mg total) by mouth every 8 (eight) hours as needed for nausea or vomiting.   pantoprazole (PROTONIX) 40 MG tablet Take 1 tablet (40 mg total) by mouth 2 (two) times daily.   polyethylene glycol (MIRALAX / GLYCOLAX) packet Take 17 g by mouth daily as needed for moderate constipation (MIX AND DRINK).   rosuvastatin (CRESTOR) 20 MG tablet TAKE 1 TABLET EVERY DAY AT 6  PM   torsemide (DEMADEX) 20 MG tablet TAKE 1 TABLET BY MOUTH TWICE A DAY   TRUEplus Lancets 33G MISC Use as directed   [DISCONTINUED] clindamycin (CLEOCIN) 300 MG capsule Take 300 mg by mouth every 6 (six) hours. (Patient not taking:  Reported on 08/26/2020)   [DISCONTINUED] Continuous Blood Gluc Sensor (FREESTYLE LIBRE 14 DAY SENSOR) MISC Inject 1 each into the skin every 14 (fourteen) days. Use as directed.   No facility-administered encounter medications on file as of 10/28/2020.    ALLERGIES: Allergies  Allergen Reactions   Dilaudid [Hydromorphone Hcl] Nausea And Vomiting   Hydromorphone Nausea And Vomiting   Lipitor [Atorvastatin] Other (See Comments)    Body & Muscle Aches   Adhesive [Tape] Rash   Ampicillin Rash and Other (See Comments)    Also developed welts on her hands when she took it in the hospital   Latex Itching, Dermatitis, Rash and Other (See Comments)    Patient had a reaction to tape after surgery and they told her she was allergic to Latex    Penicillins Rash and Other (See Comments)    Has patient had a PCN reaction causing immediate rash, facial/tongue/throat swelling, SOB or lightheadedness with hypotension: No Has patient had a PCN reaction causing severe rash involving mucus membranes or skin necrosis: No Has patient had a PCN reaction that required hospitalization: Yes - in hospital receiving treatment Has patient had a PCN reaction occurring within the last 10 years: No If all of the above answers are "NO", then may proceed with Cephalosporin use.     VACCINATION STATUS: Immunization History  Administered Date(s) Administered   Fluad Quad(high Dose 65+) 12/13/2019   Influenza Split 01/31/2011   Influenza Whole 02/23/2007   Influenza, High Dose Seasonal PF 01/10/2017   Influenza,inj,Quad PF,6+ Mos 01/07/2014, 02/09/2016, 01/01/2018, 12/03/2018   Influenza-Unspecified 01/10/2012   PFIZER(Purple Top)SARS-COV-2 Vaccination 05/24/2019, 06/18/2019,  01/11/2020   Pneumococcal Conjugate-13 05/16/2019   Pneumococcal Polysaccharide-23 08/25/2014   Tdap 04/20/2011    Diabetes She presents for her follow-up diabetic visit. She has type 2 diabetes mellitus. Onset time: She was diagnosed at approximate age of 51 years. Her disease course has been worsening. Hypoglycemia symptoms include nervousness/anxiousness and sweats. Pertinent negatives for hypoglycemia include no confusion, headaches, pallor, seizures or tremors. Associated symptoms include fatigue, polydipsia, polyuria and weight loss. Pertinent negatives for diabetes include no blurred vision, no chest pain, no foot ulcerations and no polyphagia. There are no hypoglycemic complications. Symptoms are improving. Diabetic complications include heart disease, nephropathy and retinopathy. Risk factors for coronary artery disease include diabetes mellitus, hypertension, obesity, sedentary lifestyle, post-menopausal and dyslipidemia. Current diabetic treatment includes insulin injections and oral agent (monotherapy). She is compliant with treatment most of the time. Her weight is decreasing steadily. She is following a generally unhealthy diet. When asked about meal planning, she reported none. She has not had a previous visit with a dietitian. She never participates in exercise. Her home blood glucose trend is increasing steadily. Her overall blood glucose range is >200 mg/dl. (She presents today with her meter and logs showing worsening glycemic profile overall.  Her POCT A1c today is 9.9%, worsening from last visit of 7.9%.  She recently had surgery on her back to have spinal stimulator placed to help with pain.  She reports she has not done well with her diet lately either.  She did have mild hypoglycemia this morning, likely associated with eating a light dinner last night.) An ACE inhibitor/angiotensin II receptor blocker is being taken. She does not see a podiatrist.Eye exam is current.   Hypertension This is a chronic problem. The current episode started more than 1 year ago. The problem has been gradually improving since onset. The problem is controlled. Associated symptoms include sweats. Pertinent  negatives include no blurred vision, chest pain, headaches, palpitations or shortness of breath. Agents associated with hypertension include thyroid hormones. Risk factors for coronary artery disease include diabetes mellitus, obesity, sedentary lifestyle, dyslipidemia, family history and post-menopausal state. Past treatments include angiotensin blockers, calcium channel blockers, beta blockers, diuretics and direct vasodilators. The current treatment provides mild improvement. There are no compliance problems.  Hypertensive end-organ damage includes kidney disease, CAD/MI and retinopathy. Identifiable causes of hypertension include chronic renal disease, hyperparathyroidism and a thyroid problem. undergoing workup for hyperparathyroidism.  Thyroid Problem Presents for follow-up visit. Onset time: She underwent what appears to be total thyroidectomy in her 47s for reportedly benign goiter.  Has taken thyroid hormone ever since, currently levothyroxine 100 mcg p.o. nightly. Symptoms include anxiety, depressed mood, fatigue and weight loss. Patient reports no cold intolerance, diarrhea, heat intolerance, palpitations, tremors or weight gain. The symptoms have been stable. Past treatments include levothyroxine. Prior procedures include thyroidectomy.    Review of systems  Constitutional: + steadily decreasing body weight,  current Body mass index is 35.29 kg/m. , no fatigue, no subjective hyperthermia, no subjective hypothermia Eyes: no blurry vision, no xerophthalmia ENT: no sore throat, no nodules palpated in throat, no dysphagia/odynophagia, no hoarseness Cardiovascular: no chest pain, no shortness of breath, no palpitations, + leg swelling Respiratory: no cough, no shortness of  breath Gastrointestinal: no nausea/vomiting/diarrhea Musculoskeletal: c/o chronic back pain- recently had spinal stimulator placed Skin: no rashes, no hyperemia Neurological: no tremors, no numbness, no tingling, no dizziness Psychiatric: no depression, no anxiety    Objective:    BP (!) 168/81   Pulse (!) 59   Ht _0  (1.626 m)   Wt 205 lb 9.6 oz (93.3 kg)   BMI 35.29 kg/m   Wt Readings from Last 3 Encounters:  10/28/20 205 lb 9.6 oz (93.3 kg)  07/17/20 215 lb (97.5 kg)  07/09/20 215 lb (97.5 kg)    BP Readings from Last 3 Encounters:  10/28/20 (!) 168/81  07/17/20 130/65  07/09/20 122/60      Physical Exam- Limited  Constitutional:  Body mass index is 35.29 kg/m. , not in acute distress, normal state of mind Eyes:  EOMI, no exophthalmos Neck: Supple Cardiovascular: RRR, no murmurs, rubs, or gallops, + nonpitting edema to BLE Respiratory: Adequate breathing efforts, no crackles, rales, rhonchi, or wheezing Musculoskeletal: no gross deformities, strength intact in all four extremities, no gross restriction of joint movements Skin:  no rashes, no hyperemia Neurological: no tremor with outstretched hands    Foot exam:  No rashes, ulcers, cuts, + calluses, + onychodystrophy.  Multiple ingrown toenails and foot deformity Good pulses bilat. Decreased sensation to 10 g monofilament bilat.   CMP Latest Ref Rng & Units 10/23/2020 07/06/2020 06/10/2020  Glucose 70 - 99 mg/dL - 349(H) -  BUN 8 - 23 mg/dL - 44(H) -  Creatinine 0.44 - 1.00 mg/dL - 2.05(H) -  Sodium 135 - 145 mmol/L - 137 -  Potassium 3.5 - 5.1 mmol/L - 3.5 -  Chloride 98 - 111 mmol/L - 101 -  CO2 22 - 32 mmol/L - 25 -  Calcium 8.7 - 10.3 mg/dL 10.9(H) 9.6 10.2  Total Protein 6.5 - 8.1 g/dL - 8.0 -  Total Bilirubin 0.3 - 1.2 mg/dL - 0.6 -  Alkaline Phos 38 - 126 U/L - 55 -  AST 15 - 41 U/L - 18 -  ALT 0 - 44 U/L - 15 -     Diabetic Labs (most recent):  Lab Results  Component Value Date   HGBA1C  9.9 (A) 10/28/2020   HGBA1C 7.9 (A) 06/08/2020   HGBA1C 8.8 (A) 03/09/2020     Lipid Panel ( most recent) Lipid Panel     Component Value Date/Time   CHOL 133 06/02/2020 0816   TRIG 275 (H) 06/02/2020 0816   HDL 41 (L) 06/02/2020 0816   CHOLHDL 3.2 06/02/2020 0816   VLDL 42 (H) 04/17/2019 0227   LDLCALC 60 06/02/2020 0816      Assessment & Plan:   1) DM type 2 causing vascular disease ([place])  - NAVPREET SZCZYGIEL has currently uncontrolled symptomatic type 2 DM since 70 years of age.  She presents today with her meter and logs showing worsening glycemic profile overall.  Her POCT A1c today is 9.9%, worsening from last visit of 7.9%.  She recently had surgery on her back to have spinal stimulator placed to help with pain.  She reports she has not done well with her diet lately either.  She did have mild hypoglycemia this morning, likely associated with eating a light dinner last night.  -her diabetes is complicated by retinopathy, obesity/sedentary life, and she remains at a high risk for more acute and chronic complications which include CAD, CVA, CKD, retinopathy, and neuropathy. These are all discussed in detail with her.  - Nutritional counseling repeated at each appointment due to patients tendency to fall back in to old habits.  - The patient admits there is a room for improvement in their diet and drink choices. -  Suggestion is made for the patient to avoid simple carbohydrates from their diet including Cakes, Sweet Desserts / Pastries, Ice Cream, Soda (diet and regular), Sweet Tea, Candies, Chips, Cookies, Sweet Pastries, Store Bought Juices, Alcohol in Excess of 1-2 drinks a day, Artificial Sweeteners, Coffee Creamer, and "Sugar-free" Products. This will help patient to have stable blood glucose profile and potentially avoid unintended weight gain.   - I encouraged the patient to switch to unprocessed or minimally processed complex starch and increased protein intake (animal  or plant source), fruits, and vegetables.   - Patient is advised to stick to a routine mealtimes to eat 3 meals a day and avoid unnecessary snacks (to snack only to correct hypoglycemia).  - she is following with Jearld Fenton, RDN, CDE for individualized diabetes education.  - I have approached her with the following individualized plan to manage diabetes and patient agrees:   -She will continue to benefit from her current simplified insulin treatment regimen.    -She is encouraged to stay engaged for proper monitoring and safe use of insulin (which she has struggled with in the past).  -Based on her current glycemic profile, she will tolerate slight increase in her Novolin 70/30 to 45 units SQ BID with meals if glucose is above 90 and she is eating.  She can also continue her Glipizide 5 mg XL daily with breakfast.   -She is encouraged to start monitoring blood glucose consistently 4 times per day, before meals and before bed and to call the clinic if she has readings less than 70 or greater than 200 for 3 tests in a row.  -She could greatly benefit from CGM.  She never heard about the ones prescribed previously.  Will send in Rx to [facility] for fulfillment.  - she is warned not to take insulin without proper monitoring per orders.  -She is not a candidate for Metformin due to CKD.    -  Patient specific target  A1c;  LDL, HDL, Triglycerides, were discussed in detail.  2) BP/HTN:  Her blood pressure is not controlled to target.  She says she is in quite a bit of pain today.  She is advised to continue Amlodipine 10 mg po daily, Carvedilol 6.25 mg po twice daily, Lasix 40 mg po daily as needed for swelling, Hydralazine 25 mg po TID, and Losartan 125 mg po daily. Will defer any med changes to nephrologist (who she is seeing later this month).  3) Lipids/HPL:    Her most recent lipid panel from 06/02/20 shows controlled LDL of 60 and elevated triglycerides of 275.  She has intolerance to  statins.  4)  Weight/Diet:  Her Body mass index is 35.29 kg/m.- clearly complicating her diabetes care.  She is a candidate for modest weight loss.  I discussed with her the fact that loss of 5 - 10% of her  current body weight will have the most impact on her diabetes management.  CDE Consult will be initiated . Exercise, and detailed carbohydrates information provided  -  detailed on discharge instructions.  5) Hypothyroidism-postsurgical Her previsit thyroid function tests are consistent with appropriate hormone replacement.  She is advised to continue Levothyroxine 100 mcg po daily before breakfast.     - We discussed about the correct intake of her thyroid hormone, on empty stomach at fasting, with water, separated by at least 30 minutes from breakfast and other medications,  and separated by more than 4 hours from calcium, iron, multivitamins, acid reflux medications (PPIs). -Patient is made aware of the fact that thyroid hormone replacement is needed for life, dose to be adjusted by periodic monitoring of thyroid function tests.  6) Multinodular goiter. -Her most recent thyroid/neck ultrasound from 09/12/18 revealed 2.9 cm nodule on the left lobe of her thyroid -She has surgically absent right thyroid lobe.  Biopsy of 2.9 cm nodule on the left lobe is benign.    She will not need any surgical intervention at this time.    7) Vitamin D deficiency -Her most recent vitamin D level on 06/02/20 was 15.  She was taken off her vitamin D supplement by her nephrologist. Will recommend she follow up with her nephrologist regarding low vitamin D levels.  8) Hypercalcemia / Hyperparathyroidism  Patient has had several instances of elevated calcium, with the highest level being at 11.4 mg/dL. A corresponding intact PTH level was also high, at 85. - Patient also has vitamin D deficiency,  with the last level being 15. - No apparent complications from hypercalcemia/hyperparathyroidism: no history of   nephrolithiasis, + osteoporosis, no fragility fractures. No abdominal pain, no major mood disorders, no bone pain.   - I discussed with the patient about the physiology of calcium and parathyroid hormone, and possible  effects of  increased PTH/ Calcium , including kidney stones, cardiac dysrhythmias, osteoporosis, abdominal pain, etc.   Based on her additional testing including repeat blood work and 24-hr urine collection, it appears she may have mild secondary hyperparathyroidism due to CKD.  Her repeat Ca and PTH levels have improved without intervention on recent labs. Her PTH-rp levels did not suggest malignancy, her 24-hr urine did not suggest [place].    9) Chronic Care/Health Maintenance: -she is on ARB and is encouraged to initiate and continue to follow up with Ophthalmology, Dentist,  Podiatrist at least yearly or according to recommendations, and advised to  stay away from smoking. I have recommended yearly flu vaccine and  pneumonia vaccine at least every 5 years; moderate intensity exercise for up to 150 minutes weekly; and  sleep for at least 7 hours a day.  - I advised patient to maintain close follow up with [name], MD for primary care needs.    I spent 42 minutes in the care of the patient today including review of labs from [facility], Lipids, Thyroid Function, Hematology (current and previous including abstractions from other facilities); face-to-face time discussing  her blood glucose readings/logs, discussing hypoglycemia and hyperglycemia episodes and symptoms, medications doses, her options of short and long term treatment based on the latest standards of care / guidelines;  discussion about incorporating lifestyle medicine;  and documenting the encounter.    Please refer to Patient Instructions for Blood Glucose Monitoring and Insulin/Medications Dosing Guide"  in media tab for additional information. Please  also refer to " Patient Self Inventory" in the Media  tab for  reviewed elements of pertinent patient history.  [name] participated in the discussions, expressed understanding, and voiced agreement with the above plans.  All questions were answered to her satisfaction. she is encouraged to contact clinic should she have any questions or concerns prior to her return visit.   Follow up plan: - Return in about 3 months (around 01/28/2021) for Diabetes F/U with A1c in office, No previsit labs, Bring meter and logs.  [name], [facility] [address] [place], [place] [address] Phone: [phone] Fax: [phone]  10/28/2020, 1:49 PM

## 2020-10-28 NOTE — Patient Instructions (Signed)

## 2020-10-30 DIAGNOSIS — N1832 Chronic kidney disease, stage 3b: Secondary | ICD-10-CM | POA: Diagnosis not present

## 2020-11-04 ENCOUNTER — Other Ambulatory Visit: Payer: Self-pay

## 2020-11-04 ENCOUNTER — Ambulatory Visit: Payer: Medicare HMO | Admitting: Podiatry

## 2020-11-04 DIAGNOSIS — M79675 Pain in left toe(s): Secondary | ICD-10-CM | POA: Diagnosis not present

## 2020-11-04 DIAGNOSIS — E0843 Diabetes mellitus due to underlying condition with diabetic autonomic (poly)neuropathy: Secondary | ICD-10-CM | POA: Diagnosis not present

## 2020-11-04 DIAGNOSIS — M79674 Pain in right toe(s): Secondary | ICD-10-CM

## 2020-11-04 DIAGNOSIS — B351 Tinea unguium: Secondary | ICD-10-CM | POA: Diagnosis not present

## 2020-11-04 DIAGNOSIS — R600 Localized edema: Secondary | ICD-10-CM | POA: Diagnosis not present

## 2020-11-04 NOTE — Progress Notes (Signed)
   SUBJECTIVE Patient with a history of diabetes mellitus presents to office today complaining of elongated, thickened nails that cause pain while ambulating in shoes.  Patient is unable to trim their own nails. Patient is here for further evaluation and treatment.   Past Medical History:  Diagnosis Date   Anginal pain ([place]) 03/19/2012   saw Dr. Cathie Olden .. she thinks its more related to stomach issues   Arthritis    "all over; mostly in my back" (03/20/2012)   Breast cancer ([place])    "right" (03/20/2012)   Cardiomyopathy    Chest pain 06/2005   Hospitalized, dystolic dysfunction   Chronic lower back pain    "have 2 herniated discs; going to have to have a fusion" (03/20/2012)   CKD (chronic kidney disease), stage III ([place])    Family history of anesthesia complication    "father has nausea and vomiting"   Gallstones    GERD (gastroesophageal reflux disease)    H/O hiatal hernia    History of kidney stones    History of right mastectomy    Hypertension    "patient states never had HTN, takes Hyzaar for heart   Hypothyroidism    Irritable bowel syndrome    Migraines    "it's been a long time since I've had one" (03/20/2012)   Osteoarthritis    Peripheral neuropathy    PONV (postoperative nausea and vomiting)    Sciatica    Type II diabetes mellitus ([place])     OBJECTIVE General Patient is awake, alert, and oriented x 3 and in no acute distress. Derm Skin is dry and supple bilateral. Negative open lesions or macerations. Remaining integument unremarkable. Nails are tender, long, thickened and dystrophic with subungual debris, consistent with onychomycosis, 1-5 bilateral. No signs of infection noted. Vasc  DP and PT pedal pulses palpable bilaterally. Temperature gradient within normal limits.  Edema noted bilateral lower extremities Neuro Epicritic and protective threshold sensation diminished bilaterally.  Musculoskeletal Exam No symptomatic pedal deformities noted bilateral.  Muscular strength within normal limits.  ASSESSMENT 1. Diabetes Mellitus w/ peripheral neuropathy 2.  Pain due to onychomycosis of toenails bilateral 3.  Edema bilateral lower extremities  PLAN OF CARE 1. Patient evaluated today. 2. Instructed to maintain good pedal hygiene and foot care. Stressed importance of controlling blood sugar.  3. Mechanical debridement of nails 1-5 bilaterally performed using a nail nipper. Filed with dremel without incident.  4.  Compression ankle sleeves dispensed to wear daily.   5.  Return to clinic in 3 mos.     Edrick Kins, DPM [facility]  Dr. Edrick Kins, DPM    [address], [place] [address]                Office [phone]  Fax [phone]

## 2020-11-11 DIAGNOSIS — I5032 Chronic diastolic (congestive) heart failure: Secondary | ICD-10-CM | POA: Diagnosis not present

## 2020-11-11 DIAGNOSIS — R803 Bence Jones proteinuria: Secondary | ICD-10-CM | POA: Diagnosis not present

## 2020-11-11 DIAGNOSIS — I272 Pulmonary hypertension, unspecified: Secondary | ICD-10-CM | POA: Diagnosis not present

## 2020-11-11 DIAGNOSIS — R768 Other specified abnormal immunological findings in serum: Secondary | ICD-10-CM | POA: Diagnosis not present

## 2020-11-11 DIAGNOSIS — N1832 Chronic kidney disease, stage 3b: Secondary | ICD-10-CM | POA: Diagnosis not present

## 2020-11-11 DIAGNOSIS — I129 Hypertensive chronic kidney disease with stage 1 through stage 4 chronic kidney disease, or unspecified chronic kidney disease: Secondary | ICD-10-CM | POA: Diagnosis not present

## 2020-11-11 DIAGNOSIS — E1122 Type 2 diabetes mellitus with diabetic chronic kidney disease: Secondary | ICD-10-CM | POA: Diagnosis not present

## 2020-11-12 ENCOUNTER — Telehealth: Payer: Self-pay

## 2020-11-12 NOTE — Telephone Encounter (Signed)
Kidney Dr  Royce Macadamia prescribed Wilder Glade 10mg   pt was reading side effects and wanted to know if this will be safe enough for her to take? Pt has not picked up from the pharmacy.  Pt is a diabetic and is concerned. Pt is prescribed to take one tablet a day.   Pt call back [phone]

## 2020-11-12 NOTE — Telephone Encounter (Signed)
Called patient with NP Webb's recommendation. Patient voiced understanding.

## 2020-11-25 ENCOUNTER — Other Ambulatory Visit: Payer: Self-pay

## 2020-11-25 MED ORDER — GLIPIZIDE ER 5 MG PO TB24: ORAL_TABLET | ORAL | 0 refills | Status: DC

## 2020-11-30 ENCOUNTER — Other Ambulatory Visit: Payer: Self-pay

## 2020-11-30 ENCOUNTER — Other Ambulatory Visit: Payer: Self-pay | Admitting: Critical Care Medicine

## 2020-11-30 DIAGNOSIS — E1159 Type 2 diabetes mellitus with other circulatory complications: Secondary | ICD-10-CM

## 2020-11-30 MED ORDER — GLIPIZIDE ER 5 MG PO TB24
ORAL_TABLET | ORAL | 0 refills | Status: DC
Start: 2020-11-30 — End: 2020-12-02

## 2020-12-01 ENCOUNTER — Telehealth: Payer: Self-pay

## 2020-12-01 NOTE — Telephone Encounter (Signed)
Pt needs refill on TRUEplus Lancets 33G MISC ,  Alcohol Swabs (B-D SINGLE USE SWABS REGULAR) PADS [[id number]] , Blood Glucose Monitoring Suppl (TRUE METRIX AIR GLUCOSE METER) w/Device KIT [[id number]] ,  True Metric Control level 1 soulution,   torsemide (DEMADEX) 20 MG tablet ,  pantoprazole (PROTONIX) 40 MG tablet ,  rosuvastatin (CRESTOR) 20 MG tablet,  alendronate (FOSAMAX) 70 MG tablet ,  carvedilol (COREG) 6.25 MG tablet ,  amLODipine (NORVASC) 10 MG tablet ,  ondansetron (ZOFRAN) 4 MG tablet,   glucose blood (TRUE METRIX BLOOD GLUCOSE TEST) test strip [[id number]]   All medications need to be sent to  [facility] ([facility]) - [place], [place]  [place], [place] [address]   No future appt  Past appt 07/17/20

## 2020-12-02 ENCOUNTER — Other Ambulatory Visit: Payer: Self-pay | Admitting: Family

## 2020-12-02 DIAGNOSIS — E1159 Type 2 diabetes mellitus with other circulatory complications: Secondary | ICD-10-CM

## 2020-12-02 DIAGNOSIS — M816 Localized osteoporosis [Lequesne]: Secondary | ICD-10-CM

## 2020-12-02 DIAGNOSIS — N1832 Chronic kidney disease, stage 3b: Secondary | ICD-10-CM | POA: Diagnosis not present

## 2020-12-02 DIAGNOSIS — E213 Hyperparathyroidism, unspecified: Secondary | ICD-10-CM

## 2020-12-02 MED ORDER — NOVOLIN 70/30 RELION (70-30) 100 UNIT/ML ~~LOC~~ SUSP: 45.0000 [IU] | Freq: Two times a day (BID) | SUBCUTANEOUS | 1 refills | Status: DC

## 2020-12-02 MED ORDER — LOSARTAN POTASSIUM 25 MG PO TABS: 25.0000 mg | ORAL_TABLET | Freq: Every day | ORAL | 1 refills | Status: AC

## 2020-12-02 MED ORDER — ROSUVASTATIN CALCIUM 20 MG PO TABS: 20.0000 mg | ORAL_TABLET | Freq: Every day | ORAL | 1 refills | Status: DC

## 2020-12-02 MED ORDER — PANTOPRAZOLE SODIUM 40 MG PO TBEC: 40.0000 mg | DELAYED_RELEASE_TABLET | Freq: Two times a day (BID) | ORAL | 1 refills | Status: DC

## 2020-12-02 MED ORDER — TRUE METRIX BLOOD GLUCOSE TEST VI STRP: ORAL_STRIP | 12 refills | Status: AC

## 2020-12-02 MED ORDER — TRUEPLUS LANCETS 33G MISC: 3 refills | Status: DC

## 2020-12-02 MED ORDER — HYDRALAZINE HCL 50 MG PO TABS: 50.0000 mg | ORAL_TABLET | Freq: Three times a day (TID) | ORAL | 1 refills | Status: DC

## 2020-12-02 MED ORDER — BD SWAB SINGLE USE REGULAR PADS: MEDICATED_PAD | 1 refills | Status: AC

## 2020-12-02 MED ORDER — TORSEMIDE 20 MG PO TABS: 20.0000 mg | ORAL_TABLET | Freq: Two times a day (BID) | ORAL | 1 refills | Status: AC

## 2020-12-02 MED ORDER — GLIPIZIDE ER 5 MG PO TB24: ORAL_TABLET | ORAL | 0 refills | Status: DC

## 2020-12-02 MED ORDER — CARVEDILOL 6.25 MG PO TABS: 6.2500 mg | ORAL_TABLET | Freq: Two times a day (BID) | ORAL | 1 refills | Status: DC

## 2020-12-02 MED ORDER — LEVOTHYROXINE SODIUM 100 MCG PO TABS: 100.0000 ug | ORAL_TABLET | Freq: Every day | ORAL | 1 refills | Status: DC

## 2020-12-02 MED ORDER — AMLODIPINE BESYLATE 10 MG PO TABS: 10.0000 mg | ORAL_TABLET | Freq: Every day | ORAL | 3 refills | Status: DC

## 2020-12-02 MED ORDER — METOCLOPRAMIDE HCL 10 MG PO TABS: 10.0000 mg | ORAL_TABLET | Freq: Four times a day (QID) | ORAL | 1 refills | Status: DC

## 2020-12-02 MED ORDER — ALENDRONATE SODIUM 70 MG PO TABS: 70.0000 mg | ORAL_TABLET | ORAL | 3 refills | Status: DC

## 2020-12-07 ENCOUNTER — Ambulatory Visit: Payer: Medicare HMO

## 2020-12-07 ENCOUNTER — Other Ambulatory Visit: Payer: Self-pay

## 2020-12-10 ENCOUNTER — Other Ambulatory Visit: Payer: Self-pay

## 2020-12-10 ENCOUNTER — Telehealth: Payer: Self-pay

## 2020-12-10 NOTE — Patient Outreach (Signed)
[facility]) Care Management  12/10/2020  [name] March 12, 1951 [id number]   Telephone Assessment   Successful outreach call placed to patient She reports she has good and bad days. Sh is a little disappointed and frustrated that she has not had greater improvement in back issues since surgery. She feels like surgery has helped some but not as much as she expected. She denies any RN CM needs or concerns at this time.     Medications Reviewed Today     Reviewed by Hayden Pedro, RN (Registered Nurse) on 12/10/20 at 1142  Med List Status: <None>   Medication Order Taking? Sig Documenting Provider Last Dose Status Informant  acetaminophen (TYLENOL) 500 MG tablet [id number] No Take 500-1,000 mg by mouth every 6 (six) hours as needed for moderate pain, headache or mild pain. [provider] Taking Active Self  Alcohol Swabs (B-D SINGLE USE SWABS REGULAR) PADS [id number]  Use as directed. Dutch Quint B, FNP  Active   alendronate (FOSAMAX) 70 MG tablet [id number]  Take 1 tablet (70 mg total) by mouth once a week. Take with a full glass of water on an empty stomach. Dutch Quint B, FNP  Active   amLODipine (NORVASC) 10 MG tablet [id number]  Take 1 tablet (10 mg total) by mouth daily. Dutch Quint B, FNP  Active   aspirin 81 MG tablet [id number] No Take 81 mg by mouth daily. [provider] Taking Active Self  Blood Glucose Monitoring Suppl (TRUE METRIX AIR GLUCOSE METER) w/Device KIT [id number] No 1 kit by Does not apply route 3 (three) times daily. [name], MD Taking Active Self  carvedilol (COREG) 6.25 MG tablet [id number]  Take 1 tablet (6.25 mg total) by mouth 2 (two) times daily with a meal. Dutch Quint B, FNP  Active   ferrous gluconate (FERGON) 324 MG tablet [id number] No TAKE 1 TABLET (324 MG TOTAL) BY MOUTH 2 (TWO) TIMES DAILY WITH A MEAL. Julian Hy, DO Taking Active Self  glipiZIDE (GLUCOTROL XL) 5 MG 24 hr tablet  [id number]  TAKE 1 TABLET BY MOUTH EVERY DAY WITH BREAKFAST Dutch Quint B, FNP  Active   glucose blood (TRUE METRIX BLOOD GLUCOSE TEST) test strip [id number]  Use as instructed Kennyth Arnold, FNP  Active   hydrALAZINE (APRESOLINE) 50 MG tablet [id number]  Take 1 tablet (50 mg total) by mouth 3 (three) times daily. Dutch Quint B, FNP  Active   insulin NPH-regular Human (NOVOLIN 70/30 RELION) (70-30) 100 UNIT/ML injection [id number]  Inject 45 Units into the skin 2 (two) times daily with a meal. Dutch Quint B, FNP  Active   levothyroxine (SYNTHROID) 100 MCG tablet [id number]  Take 1 tablet (100 mcg total) by mouth daily. Dutch Quint B, FNP  Active   losartan (COZAAR) 25 MG tablet [id number]  Take 1 tablet (25 mg total) by mouth daily. Dutch Quint B, FNP  Active   metoCLOPramide (REGLAN) 10 MG tablet [id number]  Take 1 tablet (10 mg total) by mouth 4 (four) times daily. Dutch Quint B, FNP  Active   ondansetron (ZOFRAN) 4 MG tablet [id number] No Take 1 tablet (4 mg total) by mouth every 8 (eight) hours as needed for nausea or vomiting. [name], MD Taking Active Self  pantoprazole (PROTONIX) 40 MG tablet [id number]  Take 1 tablet (40 mg total) by mouth 2 (two) times daily. Kennyth Arnold, FNP  Active   polyethylene glycol [facility] / GLYCOLAX) packet [id number] No Take 17  g by mouth daily as needed for moderate constipation (MIX AND DRINK). [provider] Taking Active Self  rosuvastatin (CRESTOR) 20 MG tablet [id number]  Take 1 tablet (20 mg total) by mouth at bedtime. Dutch Quint B, FNP  Active   torsemide (DEMADEX) 20 MG tablet [id number]  Take 1 tablet (20 mg total) by mouth 2 (two) times daily. Kennyth Arnold, FNP  Active   TRUEplus Lancets 33G MISC [id number]  Use as directed Kennyth Arnold, FNP  Active               Goals Addressed               This Visit's Progress     (THN)Effective Breathing Pattern During Sleep (pt-stated)         Timeframe:  Long-Range  Goal Priority:  High Start Date:   06/24/2020                          Expected End Date: Nov 2022 Follow Up Date: Nov 2022     Barriers: Health Behaviors Knowledge   -patient will report any abnormal sleeping patterns to MD -patient will complete MD appts and further workup/testing as ordered                     Evidence-based guidance:  Use a validated screening tool to identify risk for obstructive sleep apnea (OSA).  Encourage a nonsupine sleep position, such as side-lying, head of bed elevated, multiple pillows, to prevent airway collapse.  Encourage the use of sleep hygiene techniques.  Anticipate a referral for sleep study (polysomnography).  If OSA is identified, prepare patient for treatment options, such as nighttime oxygen therapy and CPAP (continuous positive airway pressure) or BiPAP (bilevel positive airway pressure).   Notes:   06/24/2020 Patient reports she was recently diagnosed with sleep apnea. She voices that she sleeps well at night and had no sxs. She saw pulmonologist on today for eval. She has been advised to undergo further workup/testing and complete sleep study. 09/10/2020-She has not had sleep study yet as she has ben more focused on getting recent back surgery completed. She will follow up with lung MD.  12/10/2020-Patient reports sxs remains unchanged. She has not followed up as she reports she is "tied of going to doctors."      (THN)Keep Pain Under Control (pt-stated)        Timeframe:  Long-Range Goal Priority:  High Start Date: 02/18/2020                             Expected End Date:  Nov2022                    Follow Up Date Nov2022    Barriers: Health Behaviors Knowledge  -patient will report any worsening sxs to MD -patient will adhere to MD appt scheduled    Why is this important?   Day-to-day life can be hard when you have back pain.  Pain medicine is just one piece of the treatment puzzle. There are many things you can do to manage  pain and keep your back strong.   Lifestyle changes, like stopping smoking and eating foods with Vitamin D and calcium, keep your bones and muscles healthy. Your back is better when it is supported by strong muscles.  You can try these action steps to help you manage  your pain.     Notes:  04/21/20-Patient going to see another back specialist for second opinion today 06/24/2020-Patient reports she continues to see pain MD. It has been reccommended that she do a trial back stimulator and is undergoing workup for that.   09/10/2020-Patient had back surgery and stimulator placed on 09/03/20. She states she is recovering. She still has some pain and swelling to site but overall feels like her pain is improved. She has only really been taking occasional Tylenol. She is walking with walker.   12/10/20-Patient with ongoing pain mgmt issues related to chronic back pain. She states surgery has not made a big difference.       (THN)Monitor and Manage My Blood Sugar (pt-stated)        Timeframe:  Long-Range Goal Priority:  High Start Date:  02/18/2020                           Expected End Date:    Nov 2022                  Follow Up Date: Nov 2022  Barriers: Health Behaviors Knowledge    -patient will monitor cbgs more frequently in the home -patient will adhere to diabetic diet at least 75-100% of the time.   Why is this important?   Checking your blood sugar at home helps to keep it from getting very high or very low.  Writing the results in a diary or log helps the doctor know how to care for you.  Your blood sugar log should have the time, date and the results.  Also, write down the amount of insulin or other medicine that you take.  Other information, like what you ate, exercise done and how you were feeling, will also be helpful.     Notes:  1/11/222-Patient admits that she has Skipped" checking cbgs and taking insulin at times as she is tired of sticking herself. RN CM reinforced adherence  measures.  06/24/2020-Patient reports she is trying to adhere to cbg monitoring-needs reinforcement. Last A1C on file 7.9(Feb 2022)  09/10/2020-No recent labwork. Patient reports cbgs have been running in the high 100s to mid 200s. She adits that appetite is decreased-eating smaller meals/snacks.  12/10/2020-Patient admits that she is still not consistently checking blood sugars every day as she does not like to prick her fingers due to painful feeling. She reports she is checking it a couple of times per week. Last blood sugar was in the 200s-non fasting. Provided recommendations to help ease monitoring and make finger sticking easier/comfortable.       (THN)Set My Target A1C (pt-stated)        Timeframe:  Long-Range Goal Priority:  Medium Start Date:    02/18/2020                         Expected End Date:March 2023                  Follow Up Date Nov 2022    Barriers: Health Behaviors Knowledge   - set target A1C-maintain level of 7.0 or less  -patient will monitor cbgs more frequently in the home -patient will adhere to diabetic diet at least 75-100% of the time.   Why is this important?   Your target A1C is decided together by you and your doctor.  It is based on several things like your age and  other health issues.    Notes:  04/21/20-Most recent A1C on file 8.8(Nov 2021) 06/24/2020-Most recent A1C level7.9(Feb 2022) 09/10/20-Patient due for A1C testing-last reading 7.9 in Feb.   12/10/2020-Most recent A1C 9.9(July 2022).        Plan: RN CM discussed with patient next outreach within the month of Nov . Patient agrees to care plan and follow up. Patient gave verbal consent and in agreement with RN CM follow up and timeframe. Patient aware that they may contact RN CM sooner for any issues or concerns. RN CM reviewed goals and plan of care with patient.    Enzo Montgomery, RN,BSN,CCM [facility] Telephonic Care Management Coordinator Direct Phone:  [phone] Toll Free: [phone] Fax: [phone]

## 2020-12-10 NOTE — Progress Notes (Addendum)
Chronic Care Management Pharmacy Assistant   Name: [name]  MRN: [id number] DOB: [birth year]   Reason for Encounter: Disease State - Diabetes Call     Recent office visits:  None noted.  Recent consult visits:  11/04/20 [name], DPM - Podiatry - Diabetic Neuropathy - Nails trimmed in office. No medication changes. Follow up in 3 months.   10/28/20 [name], NP - Endocrinology - DM Type 2 - labs ordered. No medication changes. Follow up in 3 months.   Hospital visits: 07/06/20  Medication Reconciliation was completed by comparing discharge summary, patient's EMR and Pharmacy list, and upon discussion with patient.  Admitted to the hospital on 07/06/20 due to Nonspecific Chest Pain. Discharge date was 07/06/20. Discharged from [name] Started at [facility] Discharge:?? Started PredniSONE (DELTASONE) 50 MG tablet 1 tablet daily for 5 days.  Medication Changes at Hospital Discharge: No changes noted.   Medications Discontinued at Hospital Discharge: No medications were discontinued at discharge.  Medications that remain the same after Hospital Discharge:??  All other medications will remain the same.    Medications: Outpatient Encounter Medications as of 12/10/2020  Medication Sig   acetaminophen (TYLENOL) 500 MG tablet Take 500-1,000 mg by mouth every 6 (six) hours as needed for moderate pain, headache or mild pain.   Alcohol Swabs (B-D SINGLE USE SWABS REGULAR) PADS Use as directed.   alendronate (FOSAMAX) 70 MG tablet Take 1 tablet (70 mg total) by mouth once a week. Take with a full glass of water on an empty stomach.   amLODipine (NORVASC) 10 MG tablet Take 1 tablet (10 mg total) by mouth daily.   aspirin 81 MG tablet Take 81 mg by mouth daily.   Blood Glucose Monitoring Suppl (TRUE METRIX AIR GLUCOSE METER) w/Device KIT 1 kit by Does not apply route 3 (three) times daily.   carvedilol (COREG) 6.25 MG tablet Take 1 tablet  (6.25 mg total) by mouth 2 (two) times daily with a meal.   ferrous gluconate (FERGON) 324 MG tablet TAKE 1 TABLET (324 MG TOTAL) BY MOUTH 2 (TWO) TIMES DAILY WITH A MEAL.   glipiZIDE (GLUCOTROL XL) 5 MG 24 hr tablet TAKE 1 TABLET BY MOUTH EVERY DAY WITH BREAKFAST   glucose blood (TRUE METRIX BLOOD GLUCOSE TEST) test strip Use as instructed   hydrALAZINE (APRESOLINE) 50 MG tablet Take 1 tablet (50 mg total) by mouth 3 (three) times daily.   insulin NPH-regular Human (NOVOLIN 70/30 RELION) (70-30) 100 UNIT/ML injection Inject 45 Units into the skin 2 (two) times daily with a meal.   levothyroxine (SYNTHROID) 100 MCG tablet Take 1 tablet (100 mcg total) by mouth daily.   losartan (COZAAR) 25 MG tablet Take 1 tablet (25 mg total) by mouth daily.   metoCLOPramide (REGLAN) 10 MG tablet Take 1 tablet (10 mg total) by mouth 4 (four) times daily.   ondansetron (ZOFRAN) 4 MG tablet Take 1 tablet (4 mg total) by mouth every 8 (eight) hours as needed for nausea or vomiting.   pantoprazole (PROTONIX) 40 MG tablet Take 1 tablet (40 mg total) by mouth 2 (two) times daily.   polyethylene glycol (MIRALAX / GLYCOLAX) packet Take 17 g by mouth daily as needed for moderate constipation (MIX AND DRINK).   rosuvastatin (CRESTOR) 20 MG tablet Take 1 tablet (20 mg total) by mouth at bedtime.   torsemide (DEMADEX) 20 MG tablet Take 1 tablet (20 mg total) by mouth 2 (two) times daily.  TRUEplus Lancets 33G MISC Use as directed   No facility-administered encounter medications on file as of 12/10/2020.   Current antihyperglycemic regimen:  Glipizide 80m take 1 daily Novolin 70/30 Inject 45 units twice daily   What recent interventions/DTPs have been made to improve glycemic control:  Patient reported after her last visit with Endocrinology she increased Novolin to 45 units twice daily.    Have there been any recent hospitalizations or ED visits since last visit with [name]?  [name].    Patient denies hypoglycemic symptoms, including Pale, Sweaty, Shaky, Hungry, Nervous/irritable, and Vision changes   Patient reports hyperglycemic symptoms, including polyuria and fatigue.   How often are you checking your blood sugar? [name] twice daily. Mostly in the morning before breakfast and at bedtime.    What are your blood sugars ranging?  Fasting: 200 range Before meals:  After meals:  Bedtime: 160 range   During the week, how often does your blood glucose drop below 70?  Patient denied any low blood sugars or any readings below 70.   Are you checking your feet daily/regularly? Patient reported she does check her feet regularly. She reported having some burning sensations in both feet and some tingling. She reported she has seen the podiatrist and they are aware of this. She denied any open sores of breakdown of skin.    Adherence Review: Is the patient currently on a STATIN medication? Yes Is the patient currently on ACE/ARB medication? Yes Does the patient have >5 day gap between last estimated fill dates? No  Glipizide 565mtake 1 daily - last filled 08/26/20 90 days  Novolin 70/30 Inject 40 units twice daily - last filled not available patient reports she is paying out of pocket for this medicine and does not have a prescription.    Star Rating Drugs: Rosuvastatin (CRESTOR) 20 MG tablet - last filled 10/18/20 90 days  Losartan (COZAAR) 25 MG tablet - last filled 08/05/20 90 days  GlipiZIDE (GLUCOTROL XL) 5 MG 24 hr tablet - last filled 11/23/20 (pt reported using [name] Future Appointments  Date Time Provider [place]10/21/2022 10:00 AM ReBrita RompNP REA-REA None  02/22/2021 11:00 AM [place], RoTomasa BlaseRN THN-COM None    LiJobe GibbonCCPrinceton House Behavioral Healthlinical Pharmacist Assistant  [phone] Spent: 40 minutes

## 2020-12-11 ENCOUNTER — Ambulatory Visit: Payer: Self-pay

## 2020-12-16 ENCOUNTER — Telehealth: Payer: Self-pay

## 2020-12-16 ENCOUNTER — Other Ambulatory Visit: Payer: Self-pay | Admitting: Family

## 2020-12-16 MED ORDER — NOVOLIN 70/30 RELION (70-30) 100 UNIT/ML ~~LOC~~ SUSP: 45.0000 [IU] | Freq: Two times a day (BID) | SUBCUTANEOUS | 1 refills | Status: DC

## 2020-12-16 NOTE — Telephone Encounter (Signed)
[facility] is calling needing clarification on insulin NPH-regular Human (NOVOLIN 70/30 RELION) (70-30) 100 UNIT/ML injection [[id number]  The doctor did not specifiy either to despense the vials, flex pens    Pt call back [phone] [employer])

## 2020-12-21 ENCOUNTER — Telehealth: Payer: Self-pay

## 2020-12-21 NOTE — Telephone Encounter (Signed)
Can patient take both meds at the same time? Please advise

## 2020-12-21 NOTE — Telephone Encounter (Signed)
Pt is calling she has pantoprazole (PROTONIX) 40 MG tablet,  metoCLOPramide (REGLAN) 10 MG tablet is the patient supposed to be taking bother these medications ?   Pt call back [phone]

## 2020-12-21 NOTE — Telephone Encounter (Signed)
Yes, she can take both medications.  One is for reflux and one is to help with gut motility (digestion)

## 2020-12-22 NOTE — Telephone Encounter (Signed)
Called patient back and per provider it is ok to take both meds. One is for reflux and one is for gut digestion. Patient understood. No further concerns at this time.

## 2020-12-26 IMAGING — US ULTRASOUND RIGHT BREAST LIMITED
1 series · 9 of 9 positions shown · non-contrast
Comparison: Previous exam(s).

CLINICAL DATA: Personal history of malignant RIGHT mastectomy in
[age], presenting now with diffuse pain involving the OUTER LEFT
breast and with pain at the RIGHT mastectomy site extending into the
RIGHT axilla associated with hyperpigmented skin lesions.

EXAM:
DIGITAL DIAGNOSTIC LEFT MAMMOGRAM WITH CAD AND TOMO
ULTRASOUND RIGHT BREAST

[Series 1: ultrasound right breast limited · 0.07mm/px · 9 of 9 slices shown]
[im 1/9]
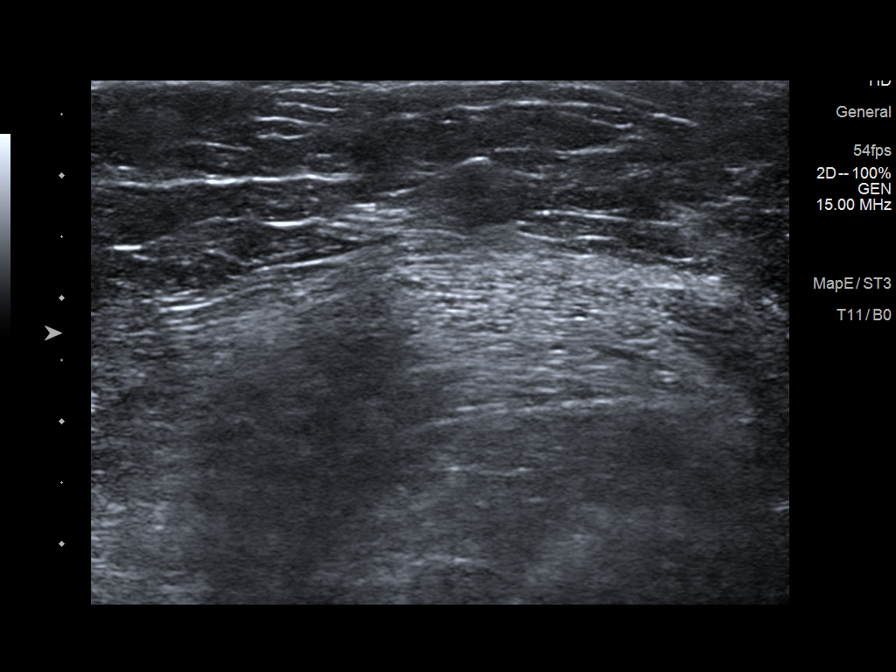
[im 2/9]
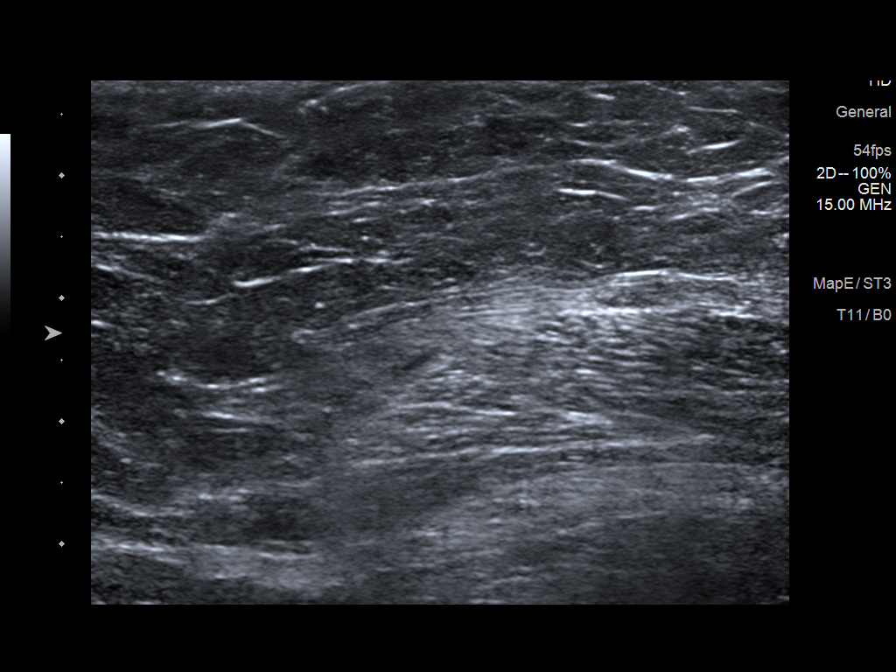
[im 3/9]
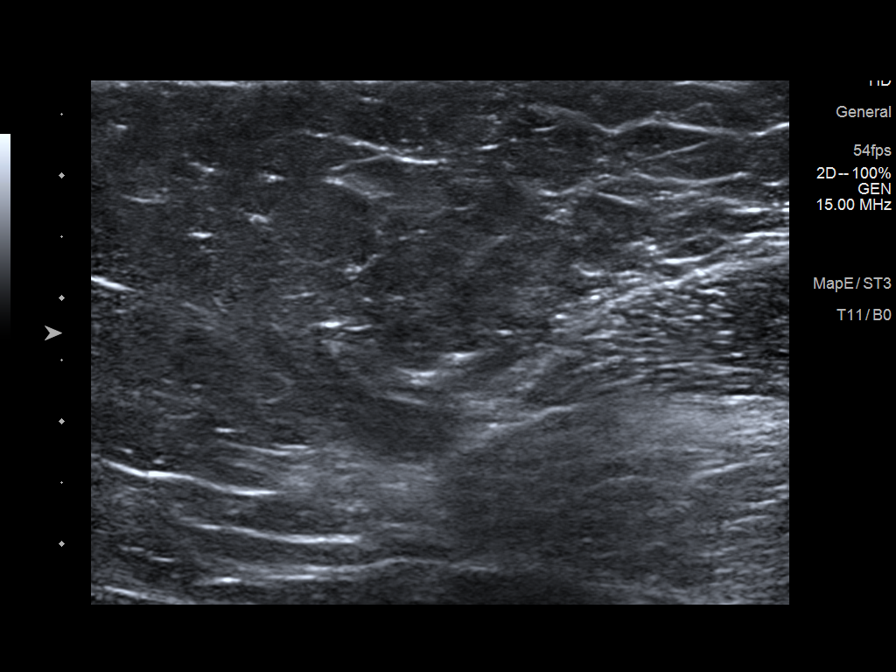
[im 4/9]
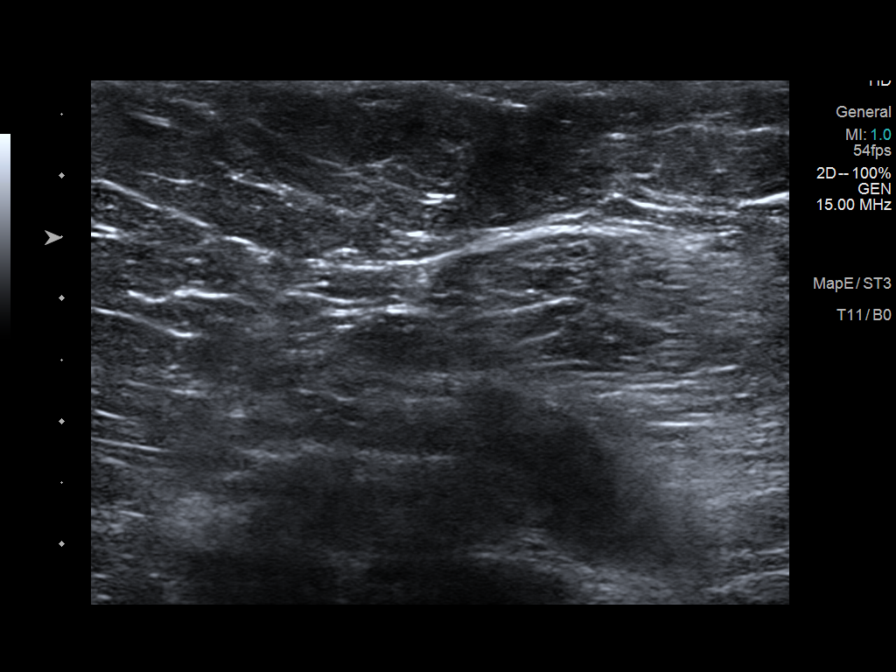
[im 5/9]
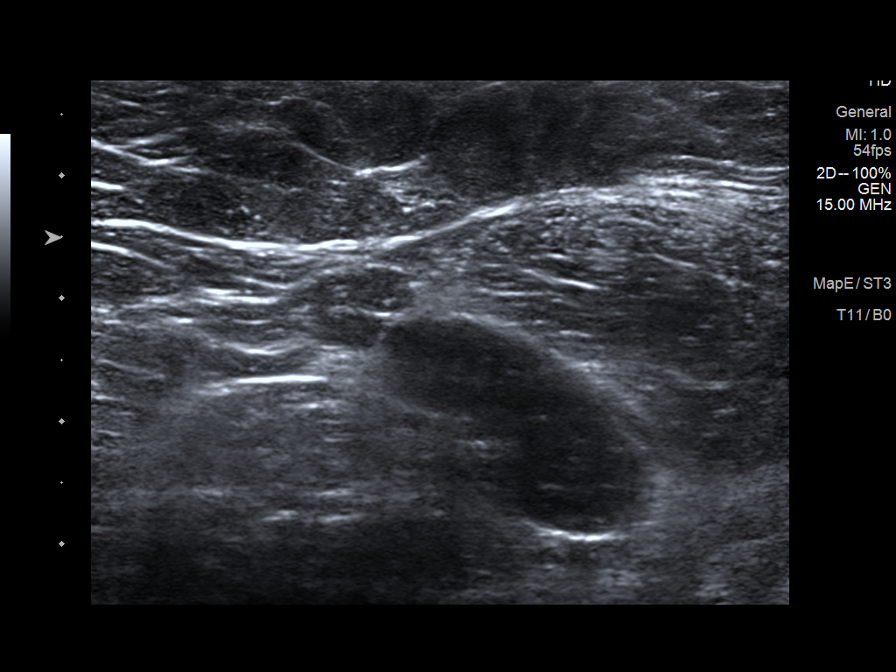
[im 6/9]
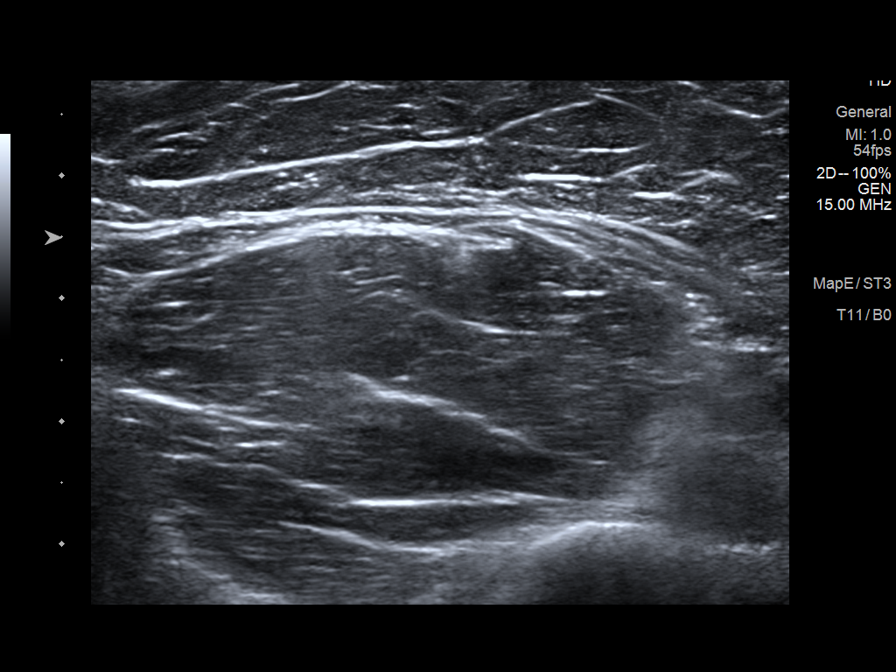
[im 7/9]
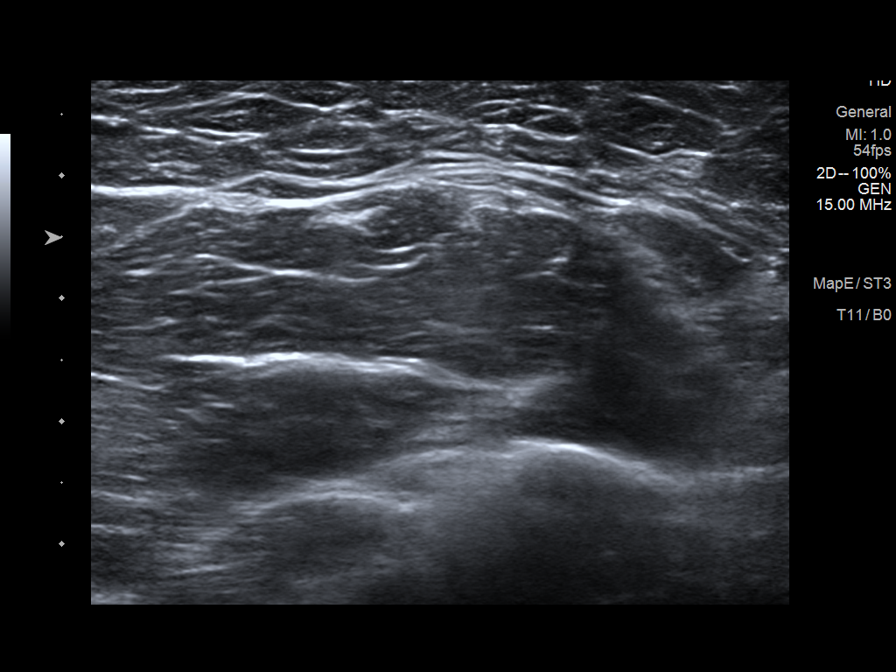
[im 8/9]
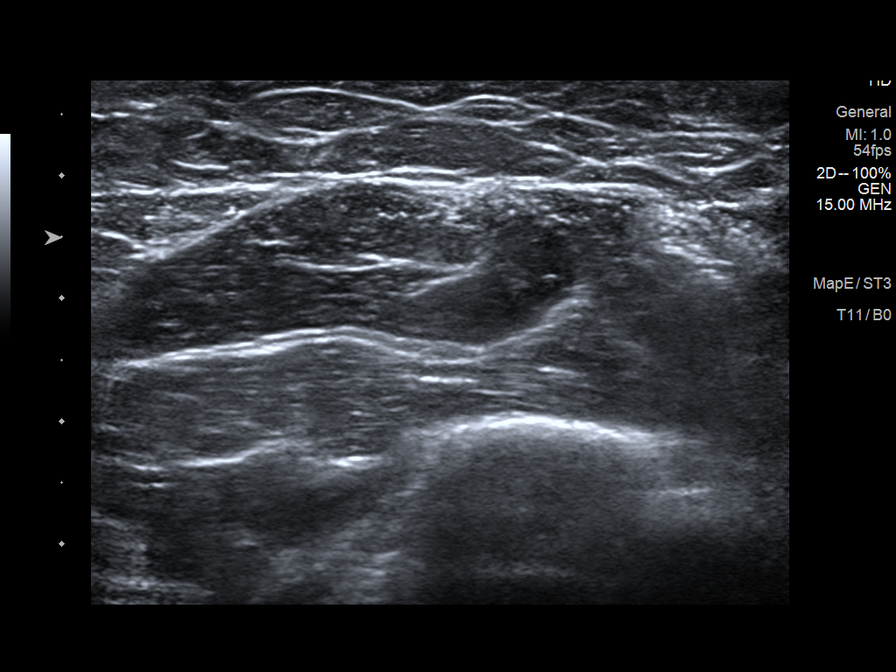
[im 9/9]
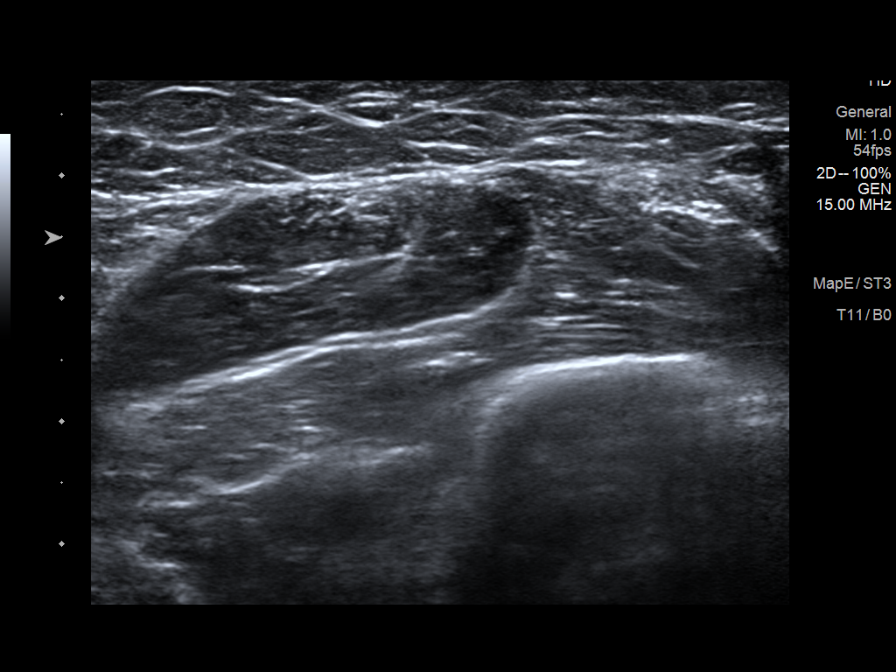

[9 of 9 positions shown; findings below may reference images not displayed]

ACR Breast Density Category b: There are scattered areas of
fibroglandular density.
FINDINGS: Tomosynthesis and synthesized full field CC and MLO views of the
LEFT breast were obtained.

Stable benign dystrophic appearing calcifications involving UPPER
OUTER LEFT breast at MIDDLE depth. No new or suspicious findings in
the LEFT breast.

Mammographic images were processed with CAD.

On physical exam, there are multiple hyperpigmented skin lesions at
the mastectomy site in RIGHT chest and in the RIGHT UPPER back and
RIGHT UPPER arm. There is no palpable mass associated with any of
these skin lesions.

Targeted RIGHT breast ultrasound is performed at the mastectomy
site, showing normal subcutaneous fat. There is no mass or abnormal
acoustic shadowing at the mastectomy site. There is no mass
underlying the hyperpigmented skin lesion at the mastectomy site or
in the RIGHT UPPER back or UPPER arm.
IMPRESSION: 1. No mammographic evidence of malignancy involving the LEFT breast.
2. No sonographic evidence of malignancy at the mastectomy site in
the RIGHT chest.
3. No masses underlying the hyperpigmented skin lesions at the
mastectomy site and in the RIGHT UPPER back and RIGHT UPPER arm.

RECOMMENDATION:
Screening LEFT mammogram in 1 year.

I have discussed the findings and recommendations with the patient.
If applicable, a reminder letter will be sent to the patient
regarding the next appointment.

BI-RADS CATEGORY  1: Negative.

## 2020-12-26 IMAGING — MG DIGITAL DIAGNOSTIC UNILATERAL LEFT MAMMOGRAM WITH TOMO AND CAD
4 series · 4 of 12 positions shown · non-contrast
Comparison: Previous exam(s).

CLINICAL DATA: Personal history of malignant RIGHT mastectomy in
5875, presenting now with diffuse pain involving the OUTER LEFT
breast and with pain at the RIGHT mastectomy site extending into the
RIGHT axilla associated with hyperpigmented skin lesions.

EXAM:
DIGITAL DIAGNOSTIC LEFT MAMMOGRAM WITH CAD AND TOMO
ULTRASOUND RIGHT BREAST

[L MLO synth-2D]
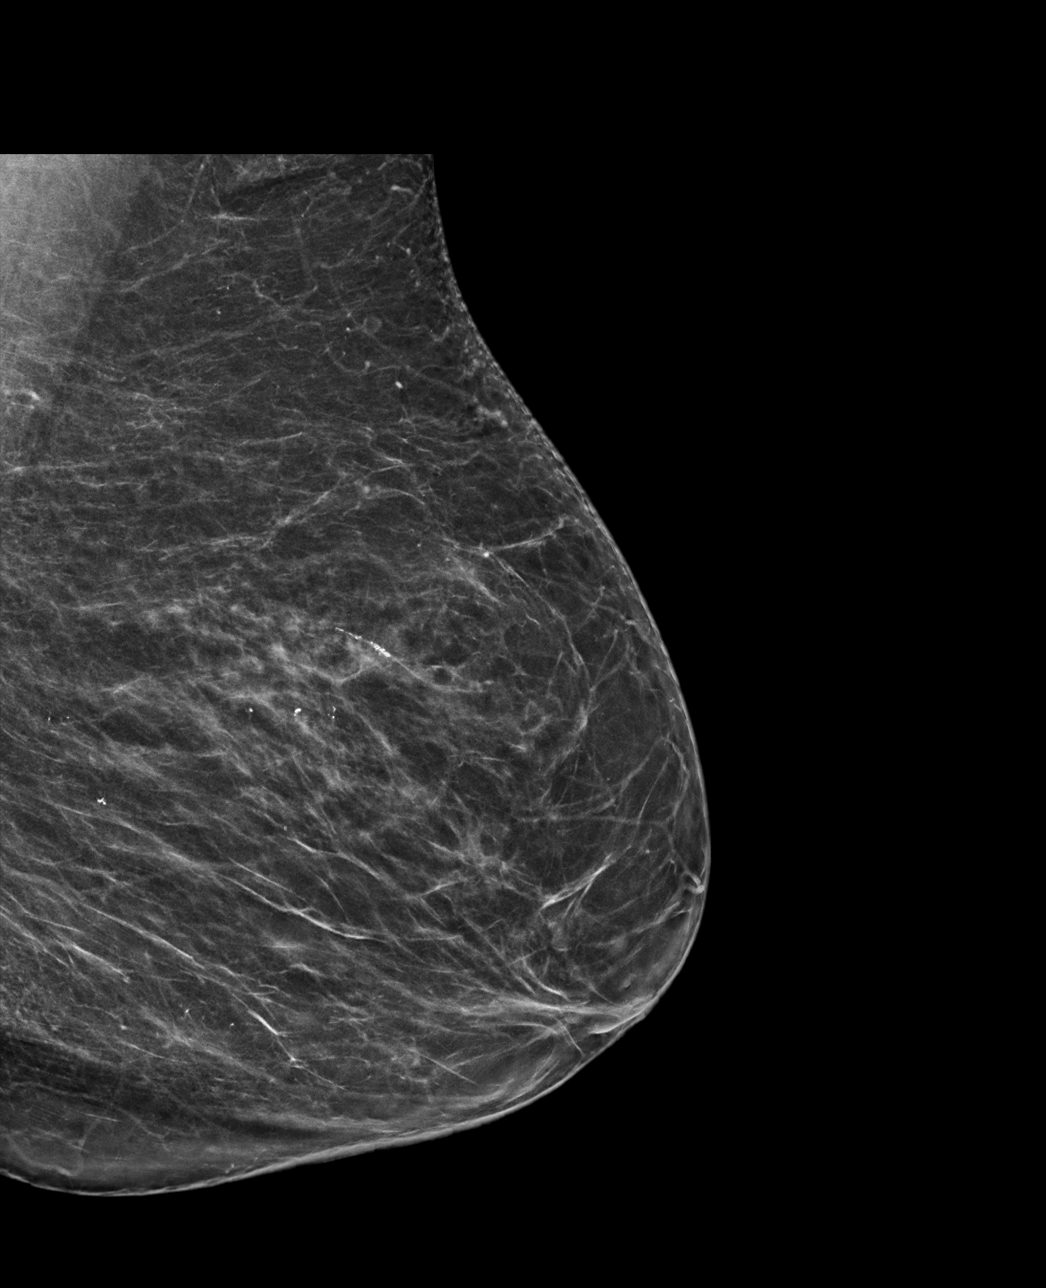

[L CC synth-2D]
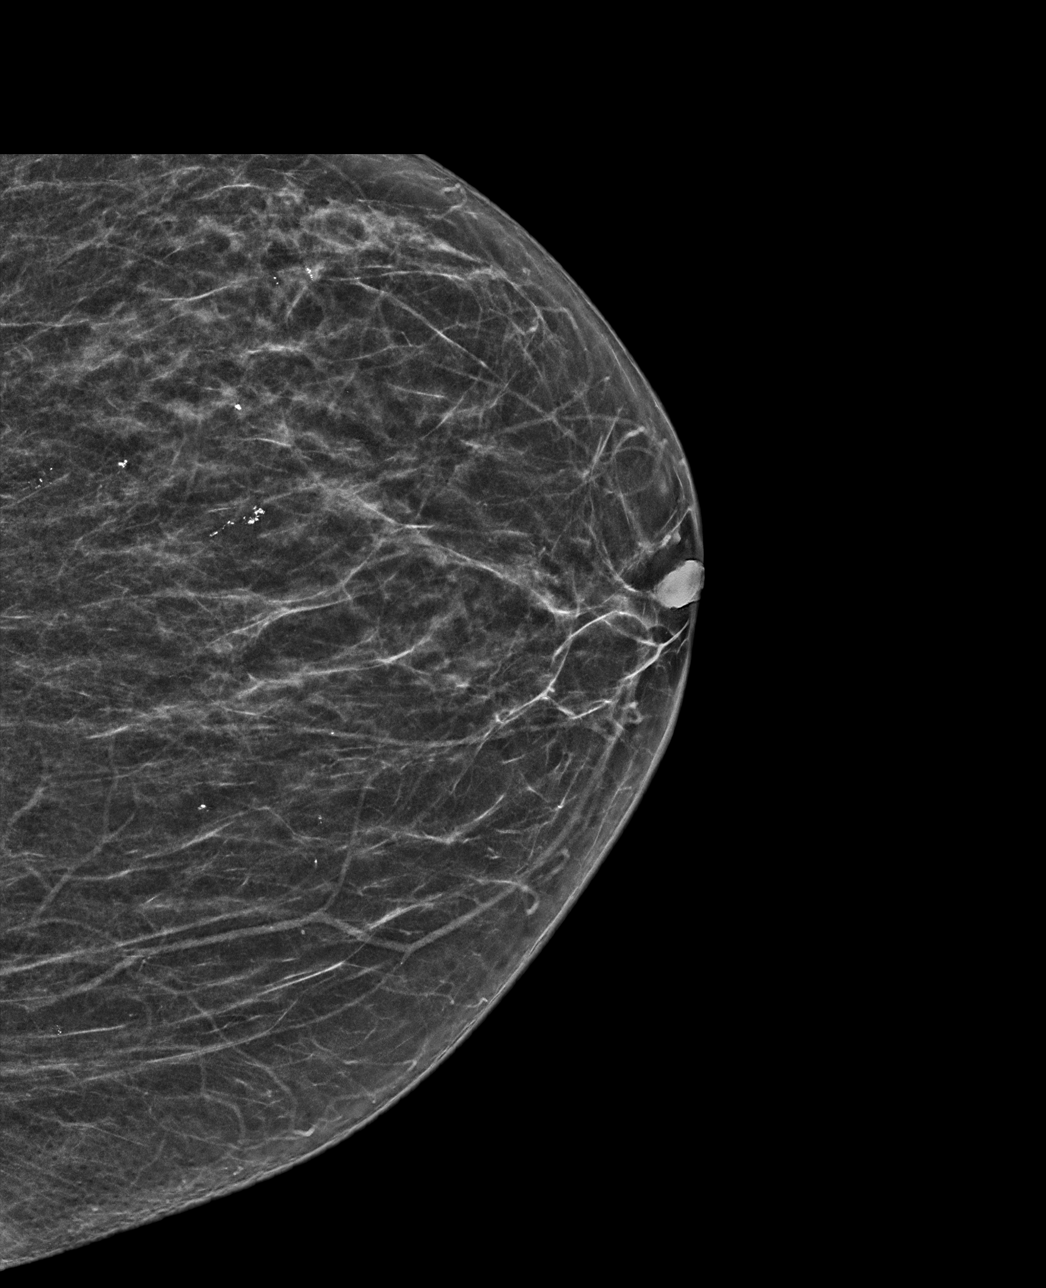

[L MLO tomo · tomo slice 38/75.0]
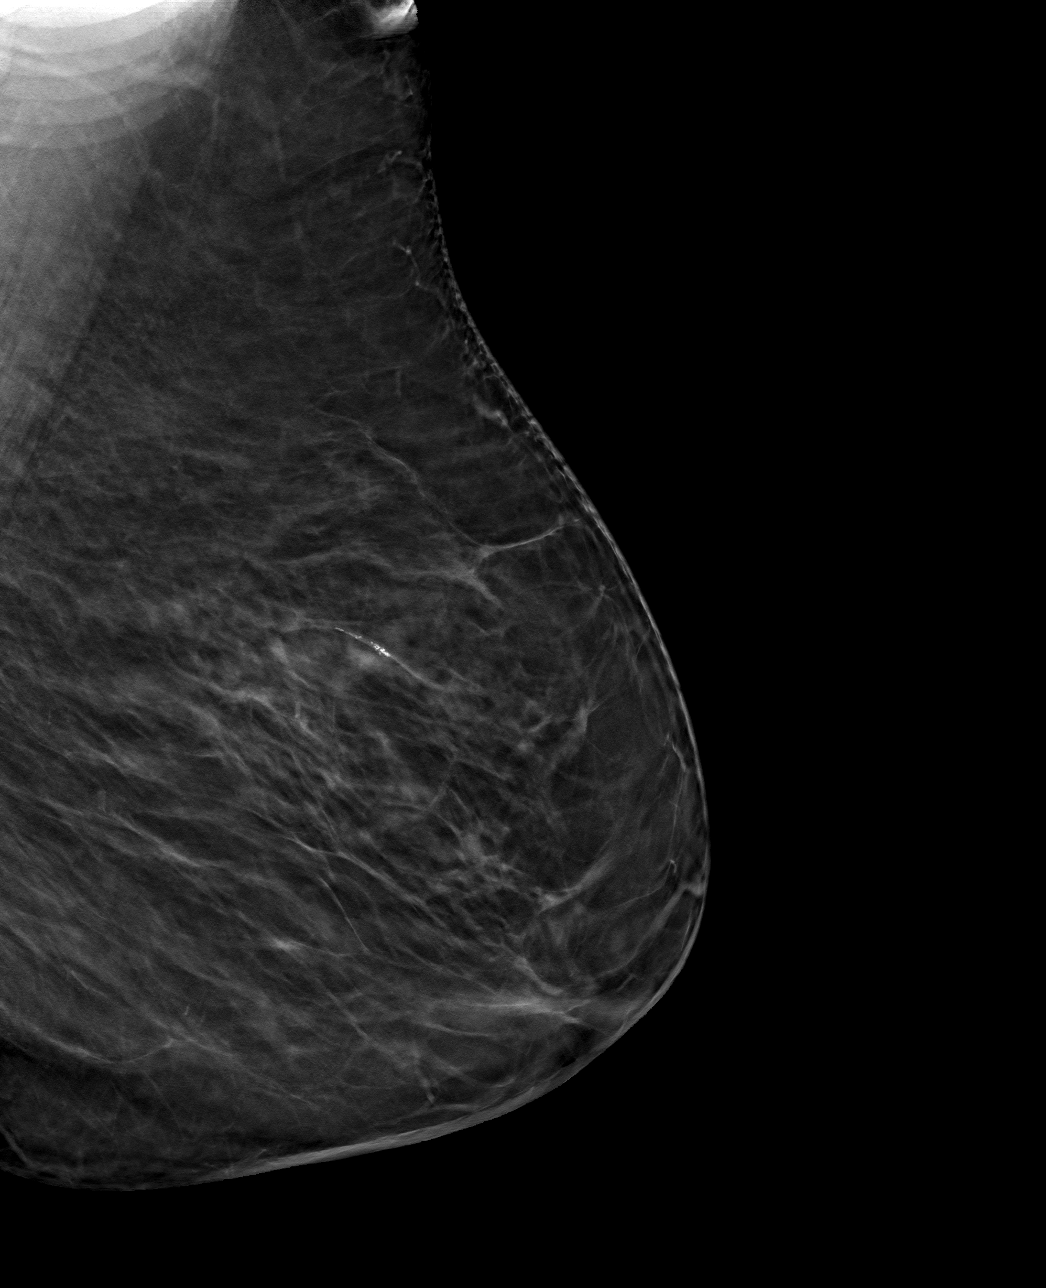

[L CC tomo · tomo slice 31/60.0]
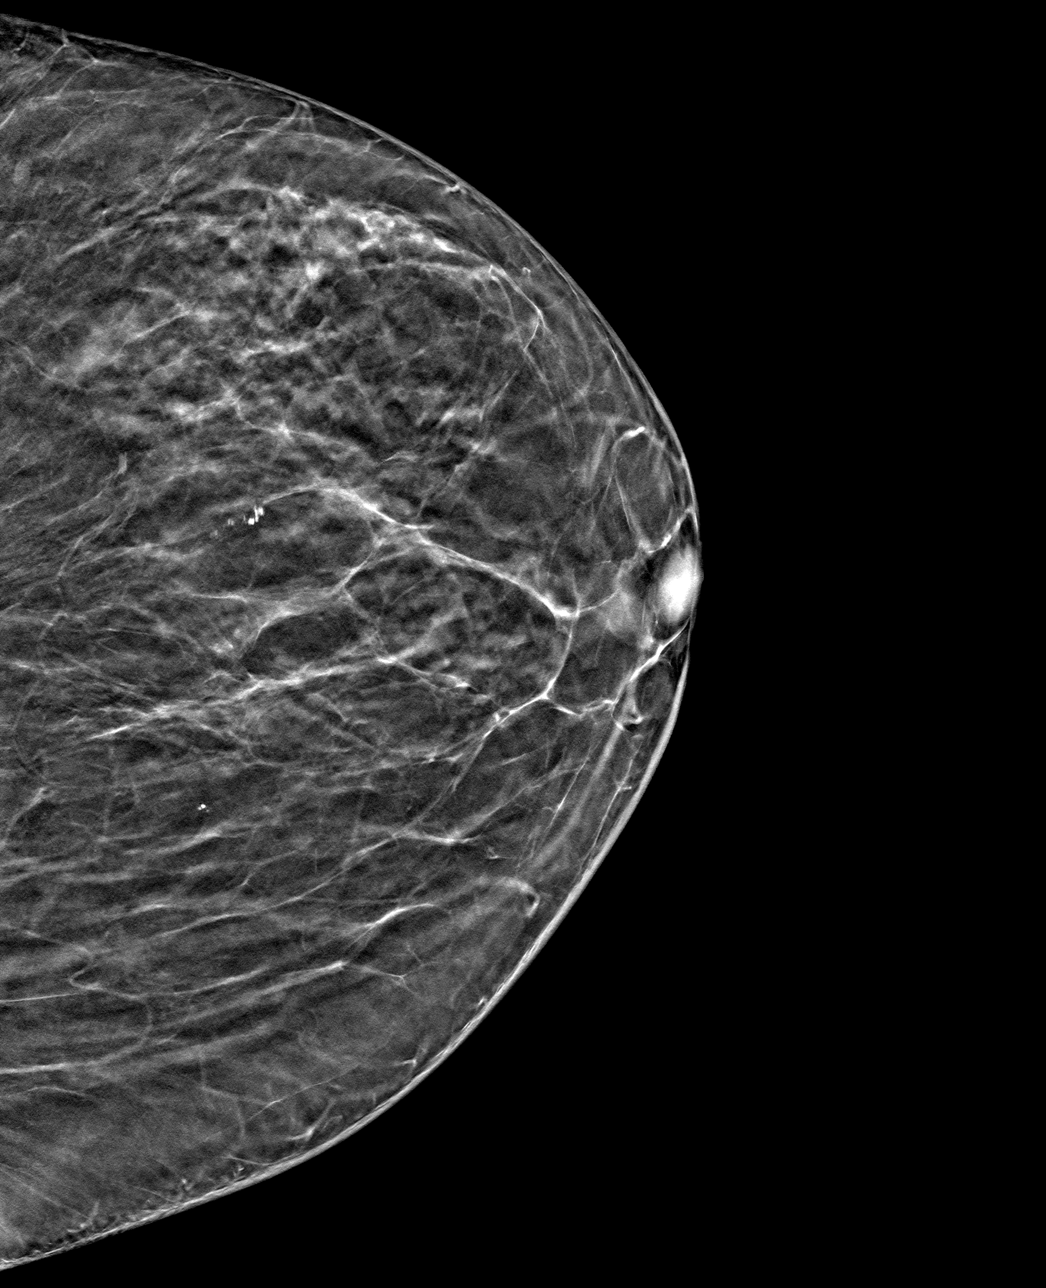

[4 of 12 positions shown; findings below may reference images not displayed]

ACR Breast Density Category b: There are scattered areas of
fibroglandular density.
FINDINGS: Tomosynthesis and synthesized full field CC and MLO views of the
LEFT breast were obtained.

Stable benign dystrophic appearing calcifications involving UPPER
OUTER LEFT breast at MIDDLE depth. No new or suspicious findings in
the LEFT breast.

Mammographic images were processed with CAD.

On physical exam, there are multiple hyperpigmented skin lesions at
the mastectomy site in RIGHT chest and in the RIGHT UPPER back and
RIGHT UPPER arm. There is no palpable mass associated with any of
these skin lesions.

Targeted RIGHT breast ultrasound is performed at the mastectomy
site, showing normal subcutaneous fat. There is no mass or abnormal
acoustic shadowing at the mastectomy site. There is no mass
underlying the hyperpigmented skin lesion at the mastectomy site or
in the RIGHT UPPER back or UPPER arm.
IMPRESSION: 1. No mammographic evidence of malignancy involving the LEFT breast.
2. No sonographic evidence of malignancy at the mastectomy site in
the RIGHT chest.
3. No masses underlying the hyperpigmented skin lesions at the
mastectomy site and in the RIGHT UPPER back and RIGHT UPPER arm.

RECOMMENDATION:
Screening LEFT mammogram in 1 year.

I have discussed the findings and recommendations with the patient.
If applicable, a reminder letter will be sent to the patient
regarding the next appointment.

BI-RADS CATEGORY  1: Negative.

## 2021-01-03 IMAGING — US ULTRASOUND FNA BIOPSY THYROID 1ST LESION
1 series · 13 of 16 positions shown · non-contrast
Comparison: Thyroid ultrasound 03/23/2018

MEDICATIONS:
None

COMPLICATIONS:
None immediate.

INDICATION: Indeterminate LEFT thyroid nodule

EXAM:
ULTRASOUND GUIDED FINE NEEDLE ASPIRATION OF INDETERMINATE THYROID
NODULE
TECHNIQUE: Informed written consent was obtained from the patient after a
discussion of the risks, benefits and alternatives to treatment.
Questions regarding the procedure were encouraged and answered. A
timeout was performed prior to the initiation of the procedure.

[Series 1: ultrasound fna biopsy thyroid 1st lesion · 16 acquisitions, 13 frames shown]
[im 1/16]
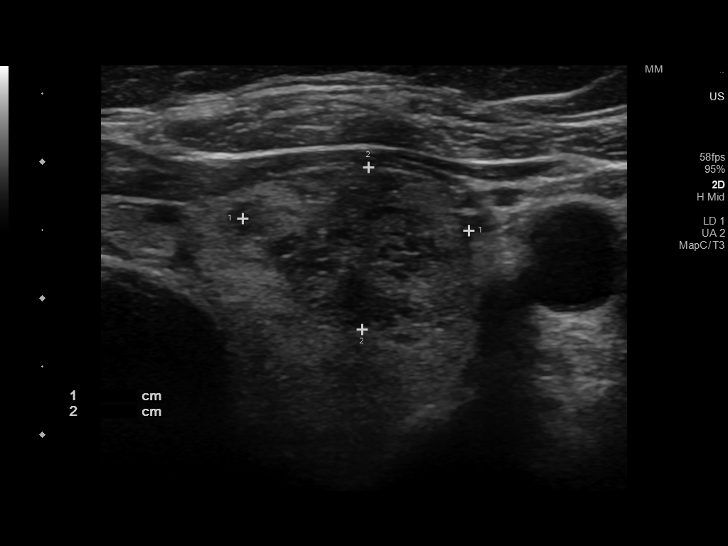
[im 2/16]
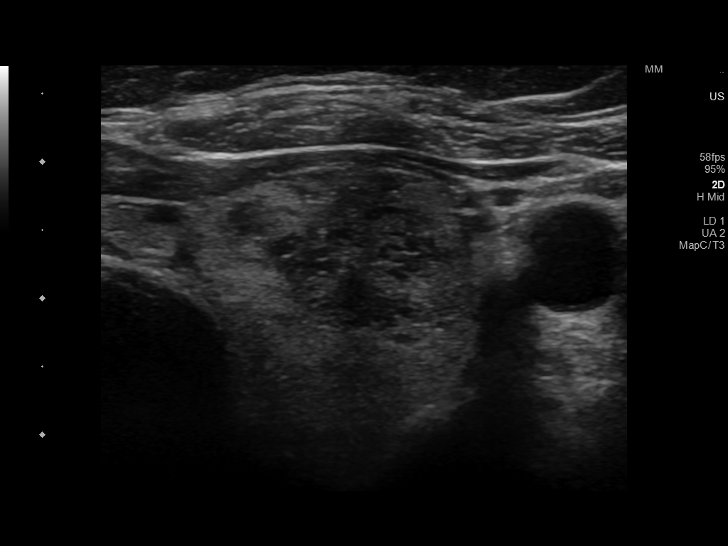
[im 4/16]
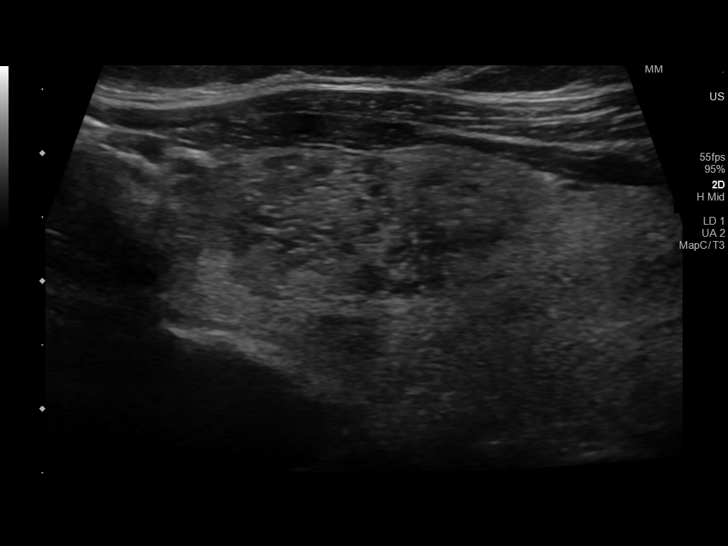
[im 5/16]
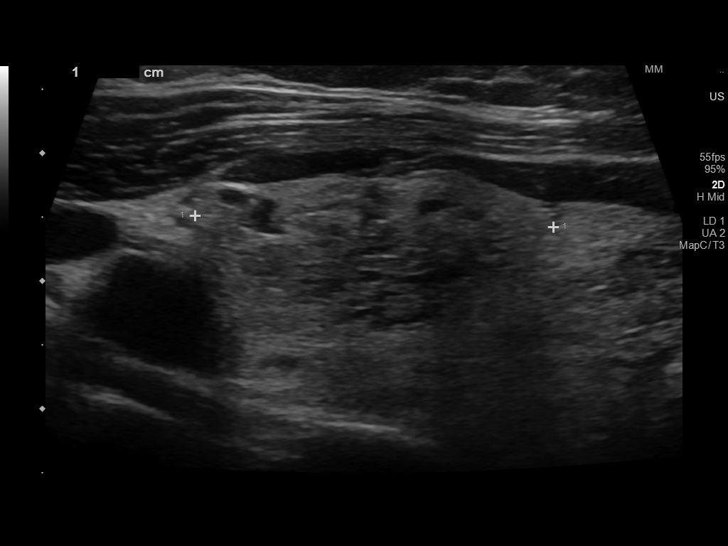
[im 6/16]
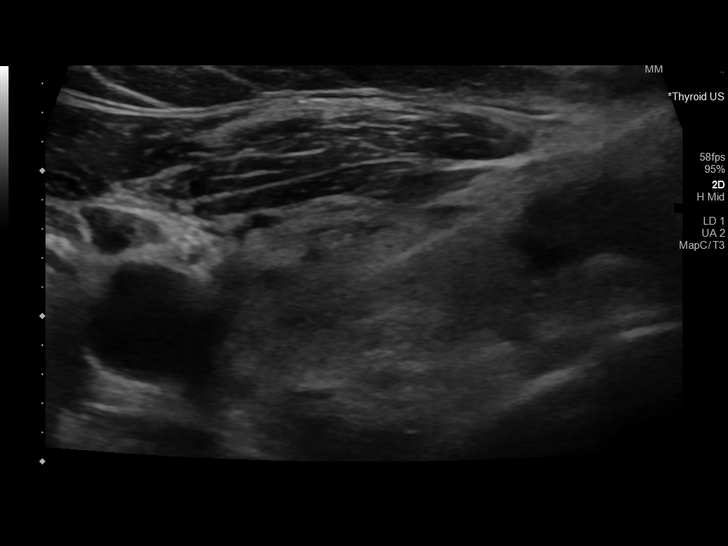
[im 7/16]
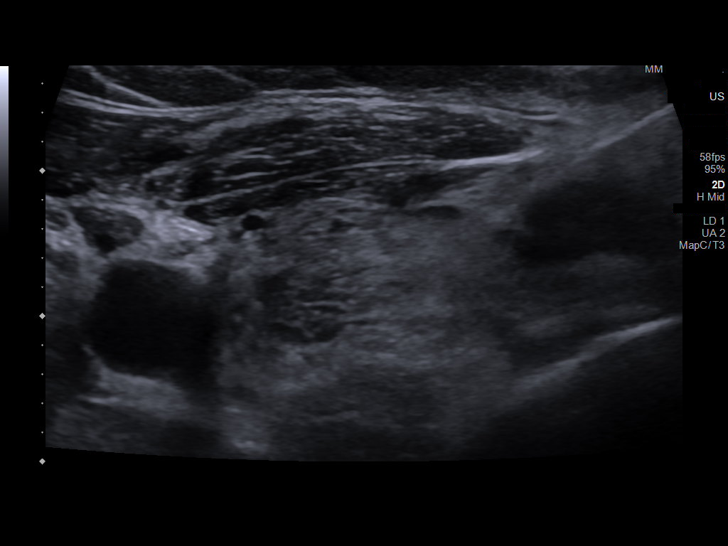
[im 9/16]
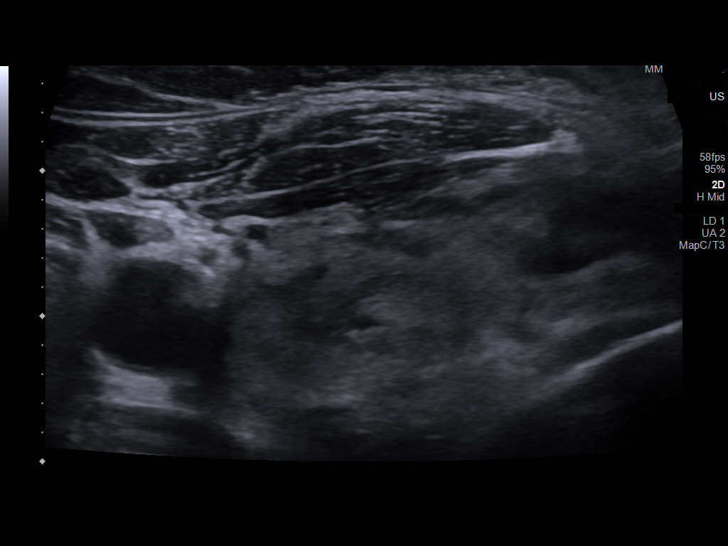
[im 10/16]
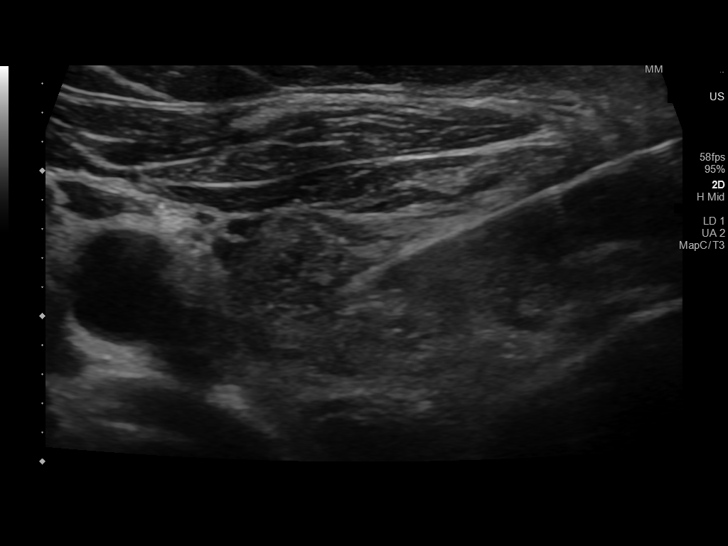
[im 11/16]
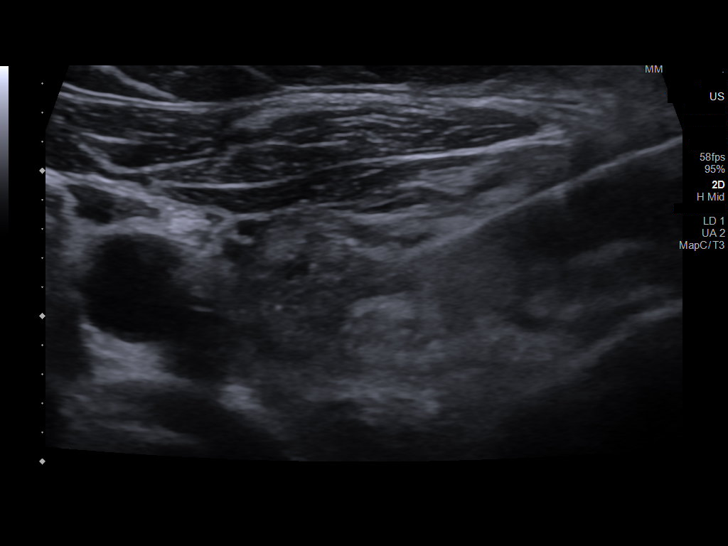
[im 12/16]
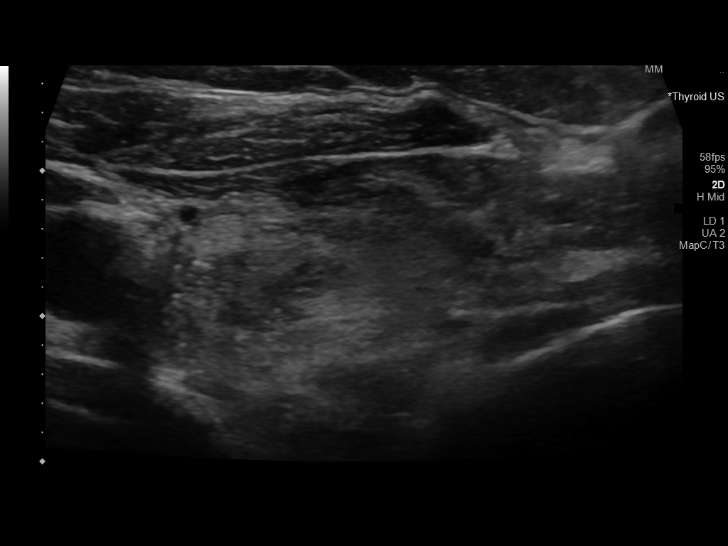
[im 13/16]
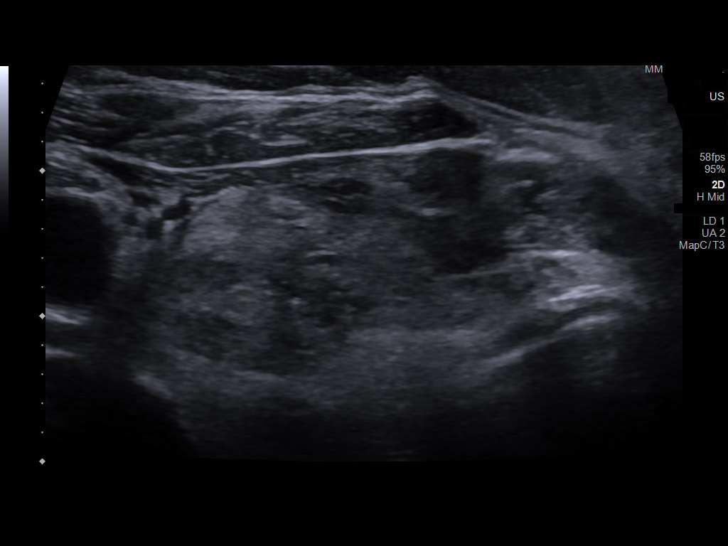
[im 15/16]
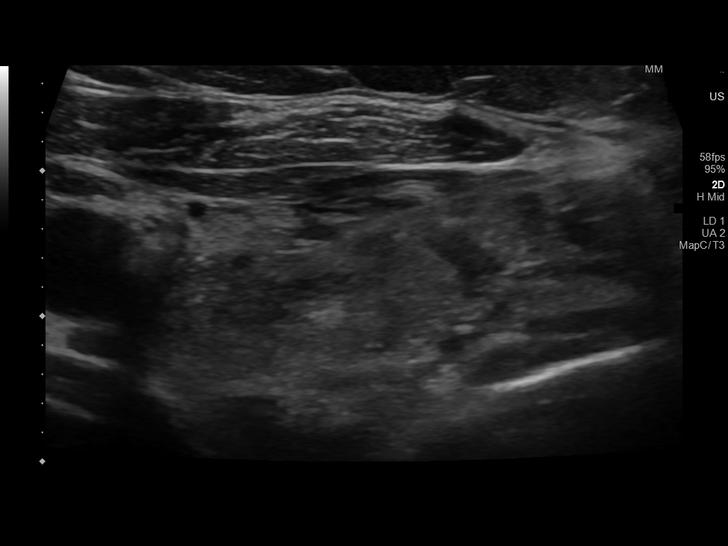
[im 16/16]
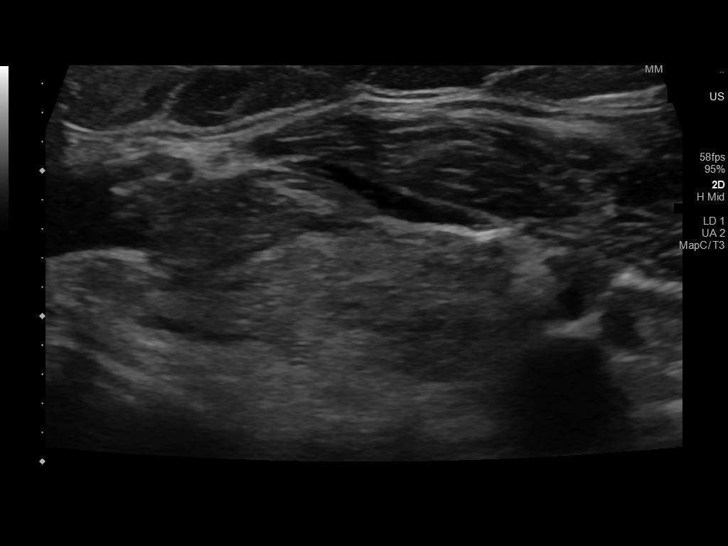

[13 of 16 positions shown; findings below may reference images not displayed]

Pre-procedural ultrasound scanning demonstrated unchanged size and
appearance of the indeterminate nodule within the superior LEFT lobe

The procedure was planned. The neck was prepped in the usual sterile
fashion, and a sterile drape was applied covering the operative
field. A timeout was performed prior to the initiation of the
procedure. Local anesthesia was provided with 1% lidocaine.

Under direct ultrasound guidance, 3 FNA biopsies were performed of
the indeterminate LEFT lobe nodule with a 25 gauge needle. Multiple
ultrasound images were saved for procedural documentation purposes.
The samples were prepared and submitted to pathology. Two additional
samples were obtained for Afirma testing, if needed.

Limited post procedural scanning was negative for hematoma or
additional complication. Dressings were placed. The patient
tolerated the above procedures procedure well without immediate
postprocedural complication.
FINDINGS: Nodule reference number based on prior diagnostic ultrasound: 1

Maximum size:

Location: Left; Superior

ACR TI-RADS risk category: TR5 (>/= 7 points)

Reason for biopsy: meets ACR TI-RADS criteria

Ultrasound imaging confirms appropriate placement of the needles
within the thyroid nodule.
IMPRESSION: Technically successful ultrasound guided fine needle aspiration of
an indeterminate LEFT thyroid nodule.

## 2021-01-25 ENCOUNTER — Telehealth: Payer: Self-pay

## 2021-01-25 NOTE — Progress Notes (Signed)
Chronic Care Management Pharmacy Assistant   Name: [name]  MRN: [id number] DOB: [birth year]   Reason for Encounter: Disease State - Diabetes Call    Recent office visits:  None noted.   Recent consult visits:  11/11/20 [name] - Nephrology - Diabetes - No notes available.  11/04/20 [name], DPPM - Podiatry - Diabetes - Mechanical debridement of nails 1-5 bilaterally performed using a nail nipper. Filed with dremel without incident.  Compression ankle sleeves dispensed to wear daily. Return to clinic in 3 mos.   10/28/20 Desiree Pigg, NP - Endocrinology - Labs ordered. No medication changes. Follow up in 3 months.  [name] Kitchen Hospital visits:  None in previous 6 months  Medications: Outpatient Encounter Medications as of 01/25/2021  Medication Sig   acetaminophen (TYLENOL) 500 MG tablet Take 500-1,000 mg by mouth every 6 (six) hours as needed for moderate pain, headache or mild pain.   Alcohol Swabs (B-D SINGLE USE SWABS REGULAR) PADS Use as directed.   alendronate (FOSAMAX) 70 MG tablet Take 1 tablet (70 mg total) by mouth once a week. Take with a full glass of water on an empty stomach.   amLODipine (NORVASC) 10 MG tablet Take 1 tablet (10 mg total) by mouth daily.   aspirin 81 MG tablet Take 81 mg by mouth daily.   Blood Glucose Monitoring Suppl (TRUE METRIX AIR GLUCOSE METER) w/Device KIT 1 kit by Does not apply route 3 (three) times daily.   carvedilol (COREG) 6.25 MG tablet Take 1 tablet (6.25 mg total) by mouth 2 (two) times daily with a meal.   ferrous gluconate (FERGON) 324 MG tablet TAKE 1 TABLET (324 MG TOTAL) BY MOUTH 2 (TWO) TIMES DAILY WITH A MEAL.   glipiZIDE (GLUCOTROL XL) 5 MG 24 hr tablet TAKE 1 TABLET BY MOUTH EVERY DAY WITH BREAKFAST   glucose blood (TRUE METRIX BLOOD GLUCOSE TEST) test strip Use as instructed   hydrALAZINE (APRESOLINE) 50 MG tablet Take 1 tablet (50 mg total) by mouth 3 (three) times daily.   insulin NPH-regular Human (NOVOLIN 70/30  RELION) (70-30) 100 UNIT/ML injection Inject 45 Units into the skin 2 (two) times daily with a meal.   levothyroxine (SYNTHROID) 100 MCG tablet Take 1 tablet (100 mcg total) by mouth daily.   losartan (COZAAR) 25 MG tablet Take 1 tablet (25 mg total) by mouth daily.   metoCLOPramide (REGLAN) 10 MG tablet Take 1 tablet (10 mg total) by mouth 4 (four) times daily.   ondansetron (ZOFRAN) 4 MG tablet Take 1 tablet (4 mg total) by mouth every 8 (eight) hours as needed for nausea or vomiting.   pantoprazole (PROTONIX) 40 MG tablet Take 1 tablet (40 mg total) by mouth 2 (two) times daily.   polyethylene glycol (MIRALAX / GLYCOLAX) packet Take 17 g by mouth daily as needed for moderate constipation (MIX AND DRINK).   rosuvastatin (CRESTOR) 20 MG tablet Take 1 tablet (20 mg total) by mouth at bedtime.   torsemide (DEMADEX) 20 MG tablet Take 1 tablet (20 mg total) by mouth 2 (two) times daily.   TRUEplus Lancets 33G MISC Use as directed   No facility-administered encounter medications on file as of 01/25/2021.    Current antihyperglycemic regimen:  Glipizide 5 mg tablet daily with breakfast Insulin NPH-regular Human (70-30) 100 unit/mL   What recent interventions/DTPs have been made to improve glycemic control:  Patient denied any recent changes in current regimen.  Have there been any recent hospitalizations or ED visits  since last visit with [name]?  Patient has not had any hospitalizations or ED visits since last visit with [name].  Patient denies hypoglycemic symptoms, including Pale, Sweaty, Shaky, Hungry, Nervous/irritable, and Vision changes   Patient denies hyperglycemic symptoms, including blurry vision, excessive thirst, fatigue, polyuria, and weakness   How often are you checking your blood sugar? Patient reported she has not been checking blood sugars    What are your blood sugars ranging? Patient has not been checking  Fasting:  Before meals:  After meals:  Bedtime:   During the  week, how often does your blood glucose drop below 70? Patient denied any readings below 70 but reported she has not been checking lately.  Are you checking your feet daily/regularly? Patient reported she does check her feet regularly and I also followed by Podiatry.She reported her feet have been peeling and she plans to call PCP today to make an appt to discuss.     Adherence Review: Is the patient currently on a STATIN medication? Yes Is the patient currently on ACE/ARB medication? Yes Does the patient have >5 day gap between last estimated fill dates? No  Glipizide 5 mg tablet daily with breakfast - last filled 11/27/20 90 days  Insulin NPH-regular Human (70-30) 100 unit/mL - last filled 12/17/20 22 days    Care Gaps  AWV: done 06/25/20 Colonoscopy: overdue 06/09/13 DM Eye Exam:  done 04/20/21 DM Foot Exam: done 11/04/20 Microalbumin: overdue HbgAIC: done 10/28/20 (6.9) due 04/30/21 DEXA: done 03/25/20 Mammogram: done 08/25/20  Star Rating Drugs: rosuvastatin (CRESTOR) 20 MG tablet - last filled 12/25/20 90 days  losartan (COZAAR) 25 MG tablet - last filled 11/12/20 90 days  glipiZIDE (GLUCOTROL XL) 5 MG 24 hr tablet - last filled 11/27/20 90 days  Future Appointments  Date Time Provider [place]  01/29/2021 10:00 AM Brita Romp, NP [facility] None  02/22/2021 11:00 AM Florance, Tomasa Blase, RN [facility] None   Jobe Gibbon, [facility] Clinical Pharmacist Assistant  [phone]  Time Spent: 47 minutes

## 2021-01-29 ENCOUNTER — Ambulatory Visit: Payer: Medicare HMO | Admitting: Nurse Practitioner

## 2021-02-01 ENCOUNTER — Other Ambulatory Visit: Payer: Self-pay

## 2021-02-01 ENCOUNTER — Encounter: Payer: Self-pay | Admitting: Family Medicine

## 2021-02-01 ENCOUNTER — Ambulatory Visit (INDEPENDENT_AMBULATORY_CARE_PROVIDER_SITE_OTHER): Payer: Medicare HMO | Admitting: Family Medicine

## 2021-02-01 VITALS — BP 142/78 | HR 62 | Temp 97.2°F | Resp 16 | Wt 201.4 lb

## 2021-02-01 DIAGNOSIS — F32A Depression, unspecified: Secondary | ICD-10-CM

## 2021-02-01 DIAGNOSIS — I10 Essential (primary) hypertension: Secondary | ICD-10-CM | POA: Diagnosis not present

## 2021-02-01 DIAGNOSIS — L84 Corns and callosities: Secondary | ICD-10-CM | POA: Diagnosis not present

## 2021-02-01 DIAGNOSIS — H6123 Impacted cerumen, bilateral: Secondary | ICD-10-CM | POA: Diagnosis not present

## 2021-02-01 DIAGNOSIS — M2042 Other hammer toe(s) (acquired), left foot: Secondary | ICD-10-CM | POA: Diagnosis not present

## 2021-02-01 DIAGNOSIS — Z23 Encounter for immunization: Secondary | ICD-10-CM | POA: Diagnosis not present

## 2021-02-01 DIAGNOSIS — E785 Hyperlipidemia, unspecified: Secondary | ICD-10-CM

## 2021-02-01 DIAGNOSIS — E1169 Type 2 diabetes mellitus with other specified complication: Secondary | ICD-10-CM

## 2021-02-01 MED ORDER — DULOXETINE HCL 20 MG PO CPEP: 20.0000 mg | ORAL_CAPSULE | Freq: Every day | ORAL | 3 refills | Status: DC

## 2021-02-01 NOTE — Patient Instructions (Addendum)
Follow up in 4-6 weeks to recheck mood We'll notify you of your lab results and make any changes if needed We'll call you with your Podiatry appt for the toe START the Duloxetine 20mg  daily to help w/ mood and pain CALL Dr Ander Slade (Dr Jenetta Downer) at [phone] to talk about the CPAP HOLD the Alendronate to see if pain improves Call with any questions or concerns Hang in there!!!

## 2021-02-01 NOTE — Progress Notes (Signed)
   Subjective:    Patient ID: [name], female    DOB: 25-Oct-1950, 70 y.o.   MRN: [id number]  HPI Ear pain- L>R, sxs started 'a couple of months ago'.  + decreased hearing.  No drainage.  Hyperlipidemia- chronic problem, on Crestor 20mg  nightly.  Due for repeat labs.  HTN- chronic problem, on Torsemide 20mg  BID, Losartan 25mg  daily, Hydralazine 50mg  TID, and Coreg 6.25mg  BID.  BP is mildly elevated but better than usual.  Depression- most of pt's sadness comes from ongoing back pain.  Feels that Alendronate is causing worsening pain.  'i can't do anything'.  L 2nd toe- pt reports pain, swelling, discoloration.  Saw Dr Jacqualyn Posey previously but did not have a good experience.   Review of Systems For ROS see HPI   This visit occurred during the SARS-CoV-2 public health emergency.  Safety protocols were in place, including screening questions prior to the visit, additional usage of staff PPE, and extensive cleaning of exam room while observing appropriate contact time as indicated for disinfecting solutions.      Objective:   Physical Exam Vitals reviewed.  Constitutional:      General: She is not in acute distress.    Appearance: Normal appearance. She is well-developed. She is not ill-appearing.  HENT:     Head: Normocephalic and atraumatic.     Right Ear: There is impacted cerumen.     Left Ear: There is impacted cerumen.     Ears:     Comments: Pt consented for ear lavage.  Large amount of wax removed from both ears w/ instant improvement in pain and hearing.  Pt tolerated procedure w/o difficulty. Eyes:     Conjunctiva/sclera: Conjunctivae normal.     Pupils: Pupils are equal, round, and reactive to light.  Neck:     Thyroid: No thyromegaly.  Cardiovascular:     Rate and Rhythm: Normal rate and regular rhythm.     Heart sounds: Normal heart sounds. No murmur heard. Pulmonary:     Effort: Pulmonary effort is normal. No respiratory distress.     Breath sounds: Normal  breath sounds.  Abdominal:     General: There is no distension.     Palpations: Abdomen is soft.     Tenderness: There is no abdominal tenderness.  Musculoskeletal:        General: Deformity (L 2nd toe w/ hammertoe deformity and thick corn/callous on DIP joint) present.     Cervical back: Normal range of motion and neck supple.  Lymphadenopathy:     Cervical: No cervical adenopathy.  Skin:    General: Skin is warm and dry.  Neurological:     Mental Status: She is alert and oriented to person, place, and time. Mental status is at baseline.  Psychiatric:        Mood and Affect: Mood normal.        Behavior: Behavior normal.          Assessment & Plan:   Hammertoe w/ corn/callous- ongoing issue for pt.  Has seen a podiatrist in the past but she says nothing w/ offered.  Referral made for repeat evaluation.  Pt expressed understanding and is in agreement w/ plan.   Cerumen impaction- new. Bilateral.  Pt wanted ears cleaned out and had immediate relief once ears were clear.  Tolerated w/o difficulty.

## 2021-02-02 NOTE — Assessment & Plan Note (Signed)
Chronic problem.  On Torsemide, Losartan, Hydralazine, and Coreg as directed.  BP is mildly elevated today but far better than some recent readings.  Currently asymptomatic.  Check labs due to ARB and diuretics.  No anticipated med changes but will follow.

## 2021-02-02 NOTE — Assessment & Plan Note (Signed)
Chronic problem.  Tolerating Crestor w/o difficulty.  Check labs.  Adjust meds prn  

## 2021-02-02 NOTE — Assessment & Plan Note (Signed)
Ongoing issue for pt.  Feels that all of her depressive sxs are due to her ongoing back pain and her inability to do what she wants.  Will start low dose Cymbalta to hopefully improve both mood and pain.  Will follow.

## 2021-02-05 ENCOUNTER — Other Ambulatory Visit: Payer: Self-pay | Admitting: Family Medicine

## 2021-02-05 ENCOUNTER — Other Ambulatory Visit: Payer: Self-pay

## 2021-02-05 ENCOUNTER — Other Ambulatory Visit: Payer: Medicare HMO

## 2021-02-05 DIAGNOSIS — E1169 Type 2 diabetes mellitus with other specified complication: Secondary | ICD-10-CM

## 2021-02-05 DIAGNOSIS — E785 Hyperlipidemia, unspecified: Secondary | ICD-10-CM | POA: Diagnosis not present

## 2021-02-06 ENCOUNTER — Other Ambulatory Visit: Payer: Self-pay | Admitting: Family

## 2021-02-06 LAB — CBC WITH DIFFERENTIAL/PLATELET
Basophils Absolute: 0.1 10*3/uL (ref 0.0–0.2)
Basos: 1 %
EOS (ABSOLUTE): 0.2 10*3/uL (ref 0.0–0.4)
Eos: 4 %
Hematocrit: 42.6 % (ref 34.0–46.6)
Hemoglobin: 13.5 g/dL (ref 11.1–15.9)
Immature Grans (Abs): 0 10*3/uL (ref 0.0–0.1)
Immature Granulocytes: 0 %
Lymphocytes Absolute: 2 10*3/uL (ref 0.7–3.1)
Lymphs: 29 %
MCH: 29.3 pg (ref 26.6–33.0)
MCHC: 31.7 g/dL (ref 31.5–35.7)
MCV: 92 fL (ref 79–97)
Monocytes Absolute: 0.5 10*3/uL (ref 0.1–0.9)
Monocytes: 7 %
Neutrophils Absolute: 3.9 10*3/uL (ref 1.4–7.0)
Neutrophils: 59 %
Platelets: 288 10*3/uL (ref 150–450)
RBC: 4.61 x10E6/uL (ref 3.77–5.28)
RDW: 13 % (ref 11.7–15.4)
WBC: 6.7 10*3/uL (ref 3.4–10.8)

## 2021-02-06 LAB — LIPID PANEL W/O CHOL/HDL RATIO
Cholesterol, Total: 162 mg/dL (ref 100–199)
HDL: 51 mg/dL (ref >39)
LDL Chol Calc (NIH): 76 mg/dL (ref 0–99)
Triglycerides: 210 mg/dL — ABNORMAL HIGH (ref 0–149)
VLDL Cholesterol Cal: 35 mg/dL (ref 5–40)

## 2021-02-06 LAB — BASIC METABOLIC PANEL
BUN/Creatinine Ratio: 9 — ABNORMAL LOW (ref 12–28)
BUN: 13 mg/dL (ref 8–27)
CO2: 21 mmol/L (ref 20–29)
Calcium: 10.3 mg/dL (ref 8.7–10.3)
Chloride: 107 mmol/L — ABNORMAL HIGH (ref 96–106)
Creatinine, Ser: 1.43 mg/dL — ABNORMAL HIGH (ref 0.57–1.00)
Glucose: 239 mg/dL — ABNORMAL HIGH (ref 70–99)
Potassium: 4.3 mmol/L (ref 3.5–5.2)
Sodium: 143 mmol/L (ref 134–144)
eGFR: 39 mL/min/{1.73_m2} — ABNORMAL LOW (ref >59)

## 2021-02-06 LAB — HEPATIC FUNCTION PANEL
ALT: 10 IU/L (ref 0–32)
AST: 12 IU/L (ref 0–40)
Albumin: 3.9 g/dL (ref 3.7–4.7)
Alkaline Phosphatase: 58 IU/L (ref 44–121)
Bilirubin Total: 0.4 mg/dL (ref 0.0–1.2)
Bilirubin, Direct: 0.14 mg/dL (ref 0.00–0.40)
Total Protein: 6.7 g/dL (ref 6.0–8.5)

## 2021-02-06 LAB — SPECIMEN STATUS REPORT

## 2021-02-08 ENCOUNTER — Ambulatory Visit: Payer: Medicare HMO | Admitting: Podiatry

## 2021-02-08 ENCOUNTER — Other Ambulatory Visit: Payer: Self-pay

## 2021-02-08 DIAGNOSIS — B351 Tinea unguium: Secondary | ICD-10-CM

## 2021-02-08 DIAGNOSIS — E0843 Diabetes mellitus due to underlying condition with diabetic autonomic (poly)neuropathy: Secondary | ICD-10-CM

## 2021-02-08 DIAGNOSIS — M79675 Pain in left toe(s): Secondary | ICD-10-CM | POA: Diagnosis not present

## 2021-02-08 DIAGNOSIS — M79674 Pain in right toe(s): Secondary | ICD-10-CM | POA: Diagnosis not present

## 2021-02-08 NOTE — Progress Notes (Signed)
   SUBJECTIVE Patient with a history of diabetes mellitus presents to office today complaining of elongated, thickened nails that cause pain while ambulating in shoes.  Patient is unable to trim their own nails. Patient is here for further evaluation and treatment.   Past Medical History:  Diagnosis Date   Anginal pain ([place]) 03/19/2012   saw Dr. Cathie Olden .. she thinks its more related to stomach issues   Arthritis    "all over; mostly in my back" (03/20/2012)   Breast cancer ([place])    "right" (03/20/2012)   Cardiomyopathy    Chest pain 06/2005   Hospitalized, dystolic dysfunction   Chronic lower back pain    "have 2 herniated discs; going to have to have a fusion" (03/20/2012)   CKD (chronic kidney disease), stage III ([place])    Family history of anesthesia complication    "father has nausea and vomiting"   Gallstones    GERD (gastroesophageal reflux disease)    H/O hiatal hernia    History of kidney stones    History of right mastectomy    Hypertension    "patient states never had HTN, takes Hyzaar for heart   Hypothyroidism    Irritable bowel syndrome    Migraines    "it's been a long time since I've had one" (03/20/2012)   Osteoarthritis    Peripheral neuropathy    PONV (postoperative nausea and vomiting)    Sciatica    Type II diabetes mellitus ([place])     OBJECTIVE General Patient is awake, alert, and oriented x 3 and in no acute distress. Derm Skin is dry and supple bilateral. Negative open lesions or macerations. Remaining integument unremarkable. Nails are tender, long, thickened and dystrophic with subungual debris, consistent with onychomycosis, 1-5 bilateral. No signs of infection noted. Vasc  DP and PT pedal pulses palpable bilaterally. Temperature gradient within normal limits.  Edema noted bilateral lower extremities Neuro Epicritic and protective threshold sensation diminished bilaterally.  Musculoskeletal Exam No symptomatic pedal deformities noted bilateral.  Muscular strength within normal limits.  ASSESSMENT 1. Diabetes Mellitus w/ peripheral neuropathy 2.  Pain due to onychomycosis of toenails bilateral 3.  Edema bilateral lower extremities  PLAN OF CARE 1. Patient evaluated today. 2. Instructed to maintain good pedal hygiene and foot care. Stressed importance of controlling blood sugar.  3. Mechanical debridement of nails 1-5 bilaterally performed using a nail nipper. Filed with dremel without incident.  4.  Return to clinic in 3 mos.     Edrick Kins, DPM [facility]  Dr. Edrick Kins, DPM    [address], [place] [address]                Office [phone]  Fax [phone]

## 2021-02-09 ENCOUNTER — Telehealth: Payer: Self-pay

## 2021-02-09 NOTE — Progress Notes (Signed)
Chronic Care Management Pharmacy Assistant   Name: [name]  MRN: [id number] DOB: Feb 26, 1951   Reason for Encounter: Disease State - Diabetes Call     Recent office visits:  02/01/21 Annye Asa, MD (PCP) - Family Medicine - Hypertension - Labs were ordered. Referral placed to Podiatry. DULoxetine (CYMBALTA) 20 MG capsule  Take 1 capsule (20 mg total) by mouth daily prescribed. No medication changes. Follow up in 4-6 weeks.   Recent consult visits:  02/08/21 [name], DPM - Pain due to onychomycosis of toenails of both feet - Mechanical debridement of nails 1-5 bilaterally performed using a nail nipper. Filed with dremel without incident. Follow up in 3 months.   Hospital visits:  None in previous 6 months  Medications: Outpatient Encounter Medications as of 02/09/2021  Medication Sig   acetaminophen (TYLENOL) 500 MG tablet Take 500-1,000 mg by mouth every 6 (six) hours as needed for moderate pain, headache or mild pain.   Alcohol Swabs (B-D SINGLE USE SWABS REGULAR) PADS Use as directed.   alendronate (FOSAMAX) 70 MG tablet Take 1 tablet (70 mg total) by mouth once a week. Take with a full glass of water on an empty stomach.   amLODipine (NORVASC) 10 MG tablet Take 1 tablet (10 mg total) by mouth daily.   aspirin 81 MG tablet Take 81 mg by mouth daily.   Blood Glucose Monitoring Suppl (TRUE METRIX AIR GLUCOSE METER) w/Device KIT 1 kit by Does not apply route 3 (three) times daily.   carvedilol (COREG) 6.25 MG tablet Take 1 tablet (6.25 mg total) by mouth 2 (two) times daily with a meal.   DULoxetine (CYMBALTA) 20 MG capsule Take 1 capsule (20 mg total) by mouth daily.   ferrous gluconate (FERGON) 324 MG tablet TAKE 1 TABLET (324 MG TOTAL) BY MOUTH 2 (TWO) TIMES DAILY WITH A MEAL.   glipiZIDE (GLUCOTROL XL) 5 MG 24 hr tablet TAKE 1 TABLET BY MOUTH EVERY DAY WITH BREAKFAST   glucose blood (TRUE METRIX BLOOD GLUCOSE TEST) test strip Use as instructed   hydrALAZINE  (APRESOLINE) 50 MG tablet Take 1 tablet (50 mg total) by mouth 3 (three) times daily.   insulin NPH-regular Human (NOVOLIN 70/30 RELION) (70-30) 100 UNIT/ML injection Inject 45 Units into the skin 2 (two) times daily with a meal.   levothyroxine (SYNTHROID) 100 MCG tablet Take 1 tablet (100 mcg total) by mouth daily.   losartan (COZAAR) 25 MG tablet Take 1 tablet (25 mg total) by mouth daily.   metoCLOPramide (REGLAN) 10 MG tablet TAKE 1 TABLET FOUR TIMES DAILY   ondansetron (ZOFRAN) 4 MG tablet Take 1 tablet (4 mg total) by mouth every 8 (eight) hours as needed for nausea or vomiting.   pantoprazole (PROTONIX) 40 MG tablet Take 1 tablet (40 mg total) by mouth 2 (two) times daily.   polyethylene glycol (MIRALAX / GLYCOLAX) packet Take 17 g by mouth daily as needed for moderate constipation (MIX AND DRINK).   rosuvastatin (CRESTOR) 20 MG tablet Take 1 tablet (20 mg total) by mouth at bedtime.   torsemide (DEMADEX) 20 MG tablet Take 1 tablet (20 mg total) by mouth 2 (two) times daily.   TRUEplus Lancets 33G MISC Use as directed   No facility-administered encounter medications on file as of 02/09/2021.    Current antihyperglycemic regimen:  Glipizide 5 mg tablet daily with breakfast Insulin NPH-regular Human (70-30) 100 unit/mL   What recent interventions/DTPs have been made to improve glycemic control:  Patient  denied any recent changes to medication regimen.   Have there been any recent hospitalizations or ED visits since last visit with [name]?  Patient has not had any hospitalizations or ED visits since last [name] visit.   Patient denies hypoglycemic symptoms, including Pale, Sweaty, Shaky, Hungry, Nervous/irritable, and Vision changes   Patient denies hyperglycemic symptoms, including blurry vision, excessive thirst, fatigue, polyuria, and weakness   How often are you checking your blood sugar? Patient reported blood sugars every other day. Denied any concerns at this time.    What are  your blood sugars ranging?  Fasting: 120 Before meals:  After meals:  Bedtime:   During the week, how often does your blood glucose drop below 70?  Patient denied any readings below 70.  Are you checking your feet daily/regularly? Patient reported she does check her feet regularly and just saw the Podiatrist yesterday.     Adherence Review: Is the patient currently on a STATIN medication? Yes Is the patient currently on ACE/ARB medication? Yes Does the patient have >5 day gap between last estimated fill dates? No   Glipizide 5 mg tablet daily with breakfast - last filled 11/27/20 90 days  Insulin NPH-regular Human (70-30) 100 unit/mL - last filled 12/17/20 22 days (does also get samples)   Care Gaps   AWV: done 06/25/20 Colonoscopy: overdue 06/09/13 DM Eye Exam:  done 04/20/21 DM Foot Exam: done 11/04/20 Microalbumin: overdue HbgAIC: done 10/28/20 (6.9) due 04/30/21 DEXA: done 03/25/20 Mammogram: done 08/25/20  Star Rating Drugs: rosuvastatin (CRESTOR) 20 MG tablet - last filled 12/25/20 90 days  losartan (COZAAR) 25 MG tablet - last filled 11/12/20 90 days  glipiZIDE (GLUCOTROL XL) 5 MG 24 hr tablet - last filled 11/27/20 90 days  Future Appointments  Date Time Provider [place]  02/22/2021 11:00 AM Florance, Tomasa Blase, RN [facility] None  03/08/2021  1:00 PM [name], MD [facility]  05/05/2021 10:00 AM Gardiner Barefoot, DPM [facility]   Jobe Gibbon, [place] Pharmacist Assistant  [phone]  Time Spent: 42 minutes

## 2021-02-22 ENCOUNTER — Other Ambulatory Visit: Payer: Self-pay

## 2021-02-22 NOTE — Patient Outreach (Signed)
[facility]) Care Management Telephonic RN Care Manager Note   02/22/2021 Name:  [name] MRN:  [id number] DOB:  1950/12/26  Telephone Assessment  Recommendations/Changes made from today's visit: None. Continue current POC.   Subjective: [name] is an 70 y.o. year old female who is a primary patient of Birdie Riddle, Aundra Millet, MD. The care management team was consulted for assistance with care management and/or care coordination needs.    Telephonic RN Care Manager completed Telephone Visit today.   Objective: Successful outreach call paced to patient. She is doing fairly well. She continues to suffer from chronic back pain. She has not seen any MDs regarding the matter lately and encouraged to do so. She saew PCP recently for routine visit. She continues to live with supportive spouse who assists her as needed.   Medications Reviewed Today     Reviewed by Hayden Pedro, RN (Registered Nurse) on 02/22/21 at 1150  Med List Status: <None>   Medication Order Taking? Sig Documenting Provider Last Dose Status Informant  acetaminophen (TYLENOL) 500 MG tablet [id number]  Take 500-1,000 mg by mouth every 6 (six) hours as needed for moderate pain, headache or mild pain. [provider]  Active Self  Alcohol Swabs (B-D SINGLE USE SWABS REGULAR) PADS [id number]  Use as directed. Dutch Quint B, FNP  Active   alendronate (FOSAMAX) 70 MG tablet [id number] No Take 1 tablet (70 mg total) by mouth once a week. Take with a full glass of water on an empty stomach.  Patient not taking: Reported on 02/22/2021   Kennyth Arnold, FNP Not Taking Active   amLODipine (NORVASC) 10 MG tablet [id number]  Take 1 tablet (10 mg total) by mouth daily. Dutch Quint B, FNP  Active   aspirin 81 MG tablet [id number]  Take 81 mg by mouth daily. [provider]  Active Self  Blood Glucose Monitoring Suppl (TRUE METRIX AIR GLUCOSE METER) w/Device KIT [id number]  1  kit by Does not apply route 3 (three) times daily. [name], MD  Active Self  carvedilol (COREG) 6.25 MG tablet [id number]  Take 1 tablet (6.25 mg total) by mouth 2 (two) times daily with a meal. Dutch Quint B, FNP  Active   DULoxetine (CYMBALTA) 20 MG capsule [id number]  Take 1 capsule (20 mg total) by mouth daily. [name], MD  Active   ferrous gluconate (FERGON) 324 MG tablet [id number]  TAKE 1 TABLET (324 MG TOTAL) BY MOUTH 2 (TWO) TIMES DAILY WITH A MEAL. Julian Hy, DO  Active Self  glipiZIDE (GLUCOTROL XL) 5 MG 24 hr tablet [id number]  TAKE 1 TABLET BY MOUTH EVERY DAY WITH BREAKFAST Dutch Quint B, FNP  Active   glucose blood (TRUE METRIX BLOOD GLUCOSE TEST) test strip [id number]  Use as instructed Kennyth Arnold, FNP  Active   hydrALAZINE (APRESOLINE) 50 MG tablet [id number]  Take 1 tablet (50 mg total) by mouth 3 (three) times daily. Dutch Quint B, FNP  Active   insulin NPH-regular Human (NOVOLIN 70/30 RELION) (70-30) 100 UNIT/ML injection [id number]  Inject 45 Units into the skin 2 (two) times daily with a meal. Dutch Quint B, FNP  Active   levothyroxine (SYNTHROID) 100 MCG tablet [id number]  Take 1 tablet (100 mcg total) by mouth daily. Dutch Quint B, FNP  Active   losartan (COZAAR) 25 MG tablet [id number]  Take 1 tablet (25 mg total) by mouth daily. Kennyth Arnold, FNP  Active  metoCLOPramide (REGLAN) 10 MG tablet [id number]  TAKE 1 TABLET FOUR TIMES DAILY Dutch Quint B, FNP  Active   ondansetron (ZOFRAN) 4 MG tablet [id number]  Take 1 tablet (4 mg total) by mouth every 8 (eight) hours as needed for nausea or vomiting. [name], MD  Active Self  pantoprazole (PROTONIX) 40 MG tablet [id number]  Take 1 tablet (40 mg total) by mouth 2 (two) times daily. Dutch Quint B, FNP  Active   polyethylene glycol [facility] / GLYCOLAX) packet [id number]  Take 17 g by mouth daily as needed for moderate constipation (MIX AND DRINK). [provider]  Active Self   rosuvastatin (CRESTOR) 20 MG tablet [id number]  Take 1 tablet (20 mg total) by mouth at bedtime. Dutch Quint B, FNP  Active   torsemide (DEMADEX) 20 MG tablet [id number]  Take 1 tablet (20 mg total) by mouth 2 (two) times daily. Dutch Quint B, FNP  Active   TRUEplus Lancets 33G MISC [id number]  Use as directed Kennyth Arnold, FNP  Active               Care Plan  Review of patient past medical history, allergies, medications, health status, including review of consultants reports, laboratory and other test data, was performed as part of comprehensive evaluation for care management services.   Care Plan : Nonspecific Low Back Pain (Adult)  Updates made by Hayden Pedro, RN since 02/22/2021 12:00 AM     Problem: Chronic Low Back Pain      Long-Range Goal: Chronic Low Back Pain Managed Completed 02/22/2021  Start Date: 02/18/2020  Expected End Date: 03/10/2021  Recent Progress: On track  Priority: High  Note:   Evidence-based guidance:  Assess pain at regular intervals using self-report (most reliable), family/caregiver report, validated pain scale; consider impact on quality of life.  Determine if pain is associated with mobility or at rest, location, intensity, frequency, duration, recurrence, pattern and description (e.g., cramping, burning, aching), triggers and relieving factors.  Mutually develop an interdisciplinary and multimodal pain management plan with patient and caregiver; track the patient's response to the pain management plan.  Partner with patient to set a comfort goal that allows for return to work, usual activity and acceptable quality of life.  Prepare patient for use of pharmacologic therapy in a stepped, single agent or combination approach that may include acetaminophen, nonsteroidal anti-inflammatory drug, antidepressant, nonbenzodiazepine muscle relaxant, lidocaine patch.  Evaluate risk for use of opioid medication, which may be prescribed for  a limited about of time.  Encourage use of multimodal interventions, such as physical therapy, cognitive behavior therapy, massage, distraction, yoga, relaxation, chiropractic manipulation, herbal supplement, acupuncture, application of heat or cold.  Encourage use of local anesthetic or analgesic therapy as an adjunct for pain control, including capsaicin cream, topical nonsteroidal anti-inflammatory drug.  Evaluate pain level, efficacy, tolerability and adherence to pain management plan.  Manage medication-induced effects, such as constipation, nausea, urinary retention, somnolence and dizziness.  Prepare patient for referral to pain education program, pain management support or community resources.  Prepare patient for referral to pain management specialist, neurologist, physical therapist, addiction specialist (if history of substance use), psychotherapist; advocate for consultation with pharmacist.  Provide follow-up and monitoring to acknowledge chronicity and manageability of pain.   Notes:   04/21/20-Patient with progressive pain-seeing second opinion specialist this afternoon  06/24/2020 RN CM reviewed pain mgmt with pt.  RN CM assessed for recent MD appts RN CM discussed treatment options-risks & benefits  09/10/2020 RN CM completed pain assessment RN CM reviewed post op care RN CM confirmed pt has MD f/u appt RN CM reviewed with pt s/s of worsening condition and when to seek medical attention  12/10/2020  RN CM reinforced and pain relief measures and MD follow up appt.  63/87/56-[id number] due to duplicate care plan and goals    Care Plan : Diabetes Type 2 (Adult)  Updates made by Hayden Pedro, RN since 02/22/2021 12:00 AM     Problem: Glycemic Management (Diabetes, Type 2)      Long-Range Goal: Glycemic Management Optimized-Maintain A1C  level of 7.0 or less Completed 02/22/2021  Start Date: 02/18/2020  Expected End Date: 04/09/2021  Recent Progress: Not  on track  Priority: High  Note:   Evidence-based guidance:  Anticipate A1C testing (point-of-care) every 3 to 6 months based on goal attainment.  Review mutually-set A1C goal or target range.  Anticipate use of antihyperglycemic with or without insulin and periodic adjustments; consider active involvement of pharmacist.  Provide medical nutrition therapy and development of individualized eating.  Compare self-reported symptoms of hypo or hyperglycemia to blood glucose levels, diet and fluid intake, current medications, psychosocial and physiologic stressors, change in activity and barriers to care adherence.  Promote self-monitoring of blood glucose levels.  Assess and address barriers to management plan, such as food insecurity, age, developmental ability, depression, anxiety, fear of hypoglycemia or weight gain, as well as medication cost, side effects and complicated regimen.  Consider referral to community-based diabetes education program, visiting nurse, community health worker or health coach.  Encourage regular dental care for treatment of periodontal disease; refer to dental provider when needed.   Notes:   06/24/2020 RN CM praised patient for lowering of A1C level RN CM assessed for med adherence and completed med review and review DM meds  RN CM provided education on diabetic diet RN CM reviewed cbg monitoring log and values with patient/caregiver.  12/10/2020 RN CM educated on blood glucose monitoring, freq of monitoring and when to alert MD.  RN CM assessed for blood glucose monitoring and any barriers.  RN CM discussed importance of adhering to treatment plan. RN CM assessed for med adherence and completed med review and review DM meds   41/66/06-[id number] due to duplicate care plan and goals    Task: Alleviate Barriers to Glycemic Management Completed 02/22/2021  Note:   Care Management Activities:    - blood glucose monitoring encouraged - blood glucose readings  reviewed    Notes:    Care Plan : RN Care Manager POC  Updates made by Hayden Pedro, RN since 02/22/2021 12:00 AM     Problem: Ongoing Disease Mgmt Education and Support of Chronic Conditions(DM, chronic back pain)   Priority: High     Long-Range Goal: Development of POC for Mgmt of Chronic Conditions   Start Date: 02/22/2021  Expected End Date: 02/22/2022  Priority: High  Note:    Current Barriers:  Chronic Disease Management support and education needs related to DMII and chronic back pain  RNCM Clinical Goal(s):  Patient will verbalize basic understanding of  DMII and chronic back pain, disease process and self health management plan of chronic conditions take all medications exactly as prescribed and will call provider for medication related questions attend all scheduled medical appointments: including PCP and specialists continue to work with RN Care Manager to address care management and care coordination needs related to  DMII and chronic back pain  through collaboration with  RN Care manager, provider, and care team.   Interventions: POC sent to PCP upon initial assessment, quarterly and with any changes in patient's conditions Inter-disciplinary care team collaboration (see longitudinal plan of care) Evaluation of current treatment plan related to  self management and patient's adherence to plan as established by provider  Pain Interventions: Pain assessment performed Medications reviewed Reviewed provider established plan for pain management; Discussed importance of adherence to all scheduled medical appointments; Counseled on the importance of reporting any/all new or changed pain symptoms or management strategies to pain management provider; Reviewed with patient prescribed pharmacological and nonpharmacological pain relief strategies; Advised patient to discuss pain mgmt, back stimulator settings with provider;   Diabetes Interventions: Assessed  patient's understanding of A1c goal: <7% Reviewed medications with patient and discussed importance of medication adherence; Counseled on importance of regular laboratory monitoring as prescribed; Reviewed scheduled/upcoming provider appointments including: PCP and specialists; Assesssed for routien DM preventive exams completion-pt completed recent podiatry visit and routine toenail care Lab Results  Component Value Date   HGBA1C 9.9 (A) 10/28/2020   Patient Goals/Self-Care Activities: Patient will self administer medications as prescribed Patient will attend all scheduled provider appointments Patient will continue to perform ADL's independently Patient will call provider office for new concerns or questions Patient will repot any worsening and/or unresolved sxs to MD   Patient will continue to monitor cbgs in the home  Follow Up Plan:  Telephone follow up appointment with care management team member scheduled for:  quarterly call-within the month of Feb. The patient has been provided with contact information for the care management team and has been advised to call with any health related questions or concerns.         Plan: RN CM discussed with patient next outreach within the month of Feb. Patient agrees to care plan and follow up. RN CM will send quarterly update to PCP.  Enzo Montgomery, RN,BSN,CCM [place] [facility] Telephonic Care Management Coordinator Direct Phone: [phone] Toll Free: [phone] Fax: [phone]'

## 2021-02-22 NOTE — Patient Outreach (Deleted)
[facility]) Care Management Telephonic RN Care Manager Note   02/22/2021 Name:  [name] MRN:  [id number] DOB:  April 25, 1950  Telephone Assessment  Recommendations/Changes made from today's visit: None. Continue current POC.   Subjective: [name] is an 70 y.o. year old female who is a primary patient of [name], Desiree Millet, MD. The care management team was consulted for assistance with care management and/or care coordination needs.    Telephonic RN Care Manager completed Telephone Visit today.   Objective: Successful outreach call paced to patient. She is doing fairly well. She continues to suffer from chronic back pain. She has not seen any MDs regarding the matter lately and encouraged to do so. She saew PCP recently for routine visit. She continues to live with supportive spouse who assists her as needed.   Medications Reviewed Today     Reviewed by Hayden Pedro, RN (Registered Nurse) on 02/22/21 at 1150  Med List Status: <None>   Medication Order Taking? Sig Documenting Provider Last Dose Status Informant  acetaminophen (TYLENOL) 500 MG tablet [id number]  Take 500-1,000 mg by mouth every 6 (six) hours as needed for moderate pain, headache or mild pain. [provider]  Active Self  Alcohol Swabs (B-D SINGLE USE SWABS REGULAR) PADS [id number]  Use as directed. Dutch Quint B, FNP  Active   alendronate (FOSAMAX) 70 MG tablet [id number] No Take 1 tablet (70 mg total) by mouth once a week. Take with a full glass of water on an empty stomach.  Patient not taking: Reported on 02/22/2021   Desiree Arnold, FNP Not Taking Active   amLODipine (NORVASC) 10 MG tablet [id number]  Take 1 tablet (10 mg total) by mouth daily. Dutch Quint B, FNP  Active   aspirin 81 MG tablet [id number]  Take 81 mg by mouth daily. [provider]  Active Self  Blood Glucose Monitoring Suppl (TRUE METRIX AIR GLUCOSE METER) w/Device KIT [id number]  1  kit by Does not apply route 3 (three) times daily. Desiree Minium, MD  Active Self  carvedilol (COREG) 6.25 MG tablet [id number]  Take 1 tablet (6.25 mg total) by mouth 2 (two) times daily with a meal. Dutch Quint B, FNP  Active   DULoxetine (CYMBALTA) 20 MG capsule [id number]  Take 1 capsule (20 mg total) by mouth daily. Desiree Minium, MD  Active   ferrous gluconate (FERGON) 324 MG tablet [id number]  TAKE 1 TABLET (324 MG TOTAL) BY MOUTH 2 (TWO) TIMES DAILY WITH A MEAL. Julian Hy, DO  Active Self  glipiZIDE (GLUCOTROL XL) 5 MG 24 hr tablet [id number]  TAKE 1 TABLET BY MOUTH EVERY DAY WITH BREAKFAST Dutch Quint B, FNP  Active   glucose blood (TRUE METRIX BLOOD GLUCOSE TEST) test strip [id number]  Use as instructed Desiree Arnold, FNP  Active   hydrALAZINE (APRESOLINE) 50 MG tablet [id number]  Take 1 tablet (50 mg total) by mouth 3 (three) times daily. Dutch Quint B, FNP  Active   insulin NPH-regular Human (NOVOLIN 70/30 RELION) (70-30) 100 UNIT/ML injection [id number]  Inject 45 Units into the skin 2 (two) times daily with a meal. Dutch Quint B, FNP  Active   levothyroxine (SYNTHROID) 100 MCG tablet [id number]  Take 1 tablet (100 mcg total) by mouth daily. Dutch Quint B, FNP  Active   losartan (COZAAR) 25 MG tablet [id number]  Take 1 tablet (25 mg total) by mouth daily. Desiree Arnold, FNP  Active  metoCLOPramide (REGLAN) 10 MG tablet [id number]  TAKE 1 TABLET FOUR TIMES DAILY Dutch Quint B, FNP  Active   ondansetron (ZOFRAN) 4 MG tablet [id number]  Take 1 tablet (4 mg total) by mouth every 8 (eight) hours as needed for nausea or vomiting. Desiree Minium, MD  Active Self  pantoprazole (PROTONIX) 40 MG tablet [id number]  Take 1 tablet (40 mg total) by mouth 2 (two) times daily. Dutch Quint B, FNP  Active   polyethylene glycol [facility] / GLYCOLAX) packet [id number]  Take 17 g by mouth daily as needed for moderate constipation (MIX AND DRINK). [provider]  Active Self   rosuvastatin (CRESTOR) 20 MG tablet [id number]  Take 1 tablet (20 mg total) by mouth at bedtime. Dutch Quint B, FNP  Active   torsemide (DEMADEX) 20 MG tablet [id number]  Take 1 tablet (20 mg total) by mouth 2 (two) times daily. Dutch Quint B, FNP  Active   TRUEplus Lancets 33G MISC [id number]  Use as directed Desiree Arnold, FNP  Active               Care Plan  Review of patient past medical history, allergies, medications, health status, including review of consultants reports, laboratory and other test data, was performed as part of comprehensive evaluation for care management services.   Care Plan : Nonspecific Low Back Pain (Adult)  Updates made by Hayden Pedro, RN since 02/22/2021 12:00 AM     Problem: Chronic Low Back Pain      Long-Range Goal: Chronic Low Back Pain Managed Completed 02/22/2021  Start Date: 02/18/2020  Expected End Date: 03/10/2021  Recent Progress: On track  Priority: High  Note:   Evidence-based guidance:  Assess pain at regular intervals using self-report (most reliable), family/caregiver report, validated pain scale; consider impact on quality of life.  Determine if pain is associated with mobility or at rest, location, intensity, frequency, duration, recurrence, pattern and description (e.g., cramping, burning, aching), triggers and relieving factors.  Mutually develop an interdisciplinary and multimodal pain management plan with patient and caregiver; track the patient's response to the pain management plan.  Partner with patient to set a comfort goal that allows for return to work, usual activity and acceptable quality of life.  Prepare patient for use of pharmacologic therapy in a stepped, single agent or combination approach that may include acetaminophen, nonsteroidal anti-inflammatory drug, antidepressant, nonbenzodiazepine muscle relaxant, lidocaine patch.  Evaluate risk for use of opioid medication, which may be prescribed for  a limited about of time.  Encourage use of multimodal interventions, such as physical therapy, cognitive behavior therapy, massage, distraction, yoga, relaxation, chiropractic manipulation, herbal supplement, acupuncture, application of heat or cold.  Encourage use of local anesthetic or analgesic therapy as an adjunct for pain control, including capsaicin cream, topical nonsteroidal anti-inflammatory drug.  Evaluate pain level, efficacy, tolerability and adherence to pain management plan.  Manage medication-induced effects, such as constipation, nausea, urinary retention, somnolence and dizziness.  Prepare patient for referral to pain education program, pain management support or community resources.  Prepare patient for referral to pain management specialist, neurologist, physical therapist, addiction specialist (if history of substance use), psychotherapist; advocate for consultation with pharmacist.  Provide follow-up and monitoring to acknowledge chronicity and manageability of pain.   Notes:   04/21/20-Patient with progressive pain-seeing second opinion specialist this afternoon  06/24/2020 RN CM reviewed pain mgmt with pt.  RN CM assessed for recent MD appts RN CM discussed treatment options-risks & benefits  09/10/2020 RN CM completed pain assessment RN CM reviewed post op care RN CM confirmed pt has MD f/u appt RN CM reviewed with pt s/s of worsening condition and when to seek medical attention  12/10/2020  RN CM reinforced and pain relief measures and MD follow up appt.  02/72/53-[id number] due to duplicate care plan and goals    Care Plan : Diabetes Type 2 (Adult)  Updates made by Hayden Pedro, RN since 02/22/2021 12:00 AM     Problem: Glycemic Management (Diabetes, Type 2)      Long-Range Goal: Glycemic Management Optimized-Maintain A1C  level of 7.0 or less Completed 02/22/2021  Start Date: 02/18/2020  Expected End Date: 04/09/2021  Recent Progress: Not  on track  Priority: High  Note:   Evidence-based guidance:  Anticipate A1C testing (point-of-care) every 3 to 6 months based on goal attainment.  Review mutually-set A1C goal or target range.  Anticipate use of antihyperglycemic with or without insulin and periodic adjustments; consider active involvement of pharmacist.  Provide medical nutrition therapy and development of individualized eating.  Compare self-reported symptoms of hypo or hyperglycemia to blood glucose levels, diet and fluid intake, current medications, psychosocial and physiologic stressors, change in activity and barriers to care adherence.  Promote self-monitoring of blood glucose levels.  Assess and address barriers to management plan, such as food insecurity, age, developmental ability, depression, anxiety, fear of hypoglycemia or weight gain, as well as medication cost, side effects and complicated regimen.  Consider referral to community-based diabetes education program, visiting nurse, community health worker or health coach.  Encourage regular dental care for treatment of periodontal disease; refer to dental provider when needed.   Notes:   06/24/2020 RN CM praised patient for lowering of A1C level RN CM assessed for med adherence and completed med review and review DM meds  RN CM provided education on diabetic diet RN CM reviewed cbg monitoring log and values with patient/caregiver.  12/10/2020 RN CM educated on blood glucose monitoring, freq of monitoring and when to alert MD.  RN CM assessed for blood glucose monitoring and any barriers.  RN CM discussed importance of adhering to treatment plan. RN CM assessed for med adherence and completed med review and review DM meds   25/95/63-[id number] due to duplicate care plan and goals    Task: Alleviate Barriers to Glycemic Management Completed 02/22/2021  Note:   Care Management Activities:    - blood glucose monitoring encouraged - blood glucose readings  reviewed    Notes:    Care Plan : RN Care Manager POC  Updates made by Hayden Pedro, RN since 02/22/2021 12:00 AM     Problem: Ongoing Disease Mgmt Education and Support of Chronic Conditions(DM, chronic back pain)   Priority: High     Long-Range Goal: Development of POC for Mgmt of Chronic Conditions   Start Date: 02/22/2021  Expected End Date: 02/22/2022  Priority: High  Note:    Current Barriers:  Chronic Disease Management support and education needs related to DMII and chronic back pain  RNCM Clinical Goal(s):  Patient will verbalize basic understanding of  DMII and chronic back pain, disease process and self health management plan of chronic conditions take all medications exactly as prescribed and will call provider for medication related questions attend all scheduled medical appointments: including PCP and specialists continue to work with RN Care Manager to address care management and care coordination needs related to  DMII and chronic back pain  through collaboration with  RN Care manager, provider, and care team.   Interventions: POC sent to PCP upon initial assessment, quarterly and with any changes in patient's conditions Inter-disciplinary care team collaboration (see longitudinal plan of care) Evaluation of current treatment plan related to  self management and patient's adherence to plan as established by provider  Pain Interventions: Pain assessment performed Medications reviewed Reviewed provider established plan for pain management; Discussed importance of adherence to all scheduled medical appointments; Counseled on the importance of reporting any/all new or changed pain symptoms or management strategies to pain management provider; Reviewed with patient prescribed pharmacological and nonpharmacological pain relief strategies; Advised patient to discuss pain mgmt, back stimulator settings with provider;   Diabetes Interventions: Assessed  patient's understanding of A1c goal: <7% Reviewed medications with patient and discussed importance of medication adherence; Counseled on importance of regular laboratory monitoring as prescribed; Reviewed scheduled/upcoming provider appointments including: PCP and specialists; Assesssed for routien DM preventive exams completion-pt completed recent podiatry visit and routine toenail care Lab Results  Component Value Date   HGBA1C 9.9 (A) 10/28/2020   Patient Goals/Self-Care Activities: Patient will self administer medications as prescribed Patient will attend all scheduled provider appointments Patient will continue to perform ADL's independently Patient will call provider office for new concerns or questions Patient will repot any worsening and/or unresolved sxs to MD   Patient will continue to monitor cbgs in the home  Follow Up Plan:  Telephone follow up appointment with care management team member scheduled for:  quarterly call-within the month of Feb. The patient has been provided with contact information for the care management team and has been advised to call with any health related questions or concerns.         Plan: RN CM discussed with patient next outreach within the month of Feb. Patient agrees to care plan and follow up. RN CM will send quarterly update to PCP.  Enzo Montgomery, RN,BSN,CCM [facility] Telephonic Care Management Coordinator Direct Phone: [phone] Toll Free: [phone] Fax: [phone]'

## 2021-02-25 ENCOUNTER — Other Ambulatory Visit: Payer: Self-pay | Admitting: Family Medicine

## 2021-02-25 NOTE — Telephone Encounter (Signed)
Patient is requesting a refill of the following medications: Requested Prescriptions   Pending Prescriptions Disp Refills   DULoxetine (CYMBALTA) 20 MG capsule [Pharmacy Med Name: DULOXETINE HCL DR 20 MG CAP] 90 capsule 2    Sig: TAKE 1 CAPSULE BY MOUTH EVERY DAY    Date of patient request: 02/24/2021 Last office visit: 02/01/2021 Date of last refill: 02/01/2021 Last refill amount: 30 capsules 3 refills  Follow up time period per chart: 03/08/2021

## 2021-03-08 ENCOUNTER — Ambulatory Visit (INDEPENDENT_AMBULATORY_CARE_PROVIDER_SITE_OTHER): Payer: Medicare HMO | Admitting: Family Medicine

## 2021-03-08 ENCOUNTER — Encounter: Payer: Self-pay | Admitting: Family Medicine

## 2021-03-08 VITALS — BP 108/60 | HR 65 | Temp 97.8°F | Resp 17 | Wt 201.4 lb

## 2021-03-08 DIAGNOSIS — F32A Depression, unspecified: Secondary | ICD-10-CM | POA: Diagnosis not present

## 2021-03-08 DIAGNOSIS — R079 Chest pain, unspecified: Secondary | ICD-10-CM | POA: Diagnosis not present

## 2021-03-08 DIAGNOSIS — R1319 Other dysphagia: Secondary | ICD-10-CM

## 2021-03-08 MED ORDER — FAMOTIDINE 40 MG PO TABS: 40.0000 mg | ORAL_TABLET | Freq: Every day | ORAL | 3 refills | Status: DC

## 2021-03-08 NOTE — Patient Instructions (Signed)
Follow up in 6 weeks to recheck reflux ADD the Famotidine once daily in addition to the twice daily Pantoprazole HOPEFULLY if we decrease the reflux, it will decrease the pain and inflammation in the chest, and improve the swallowing Call with any questions or concerns Hang in there! HAPPY HOLIDAYS!!!

## 2021-03-08 NOTE — Progress Notes (Signed)
   Subjective:    Patient ID: [name], female    DOB: [birth year], 70 y.o.   MRN: [id number]  HPI Mood- pt never started her Cymbalta b/c she read the possible side effects and elected not to try.  Reports mood is fine but back pain is worsening.  'in my mind I feel better'- she had a talk w/ God.  Chest pain- ongoing.  She states she has CP that goes from L chest, 'straight through to my back'.  Chest is TTP.  Has not followed up w/ Cardiology.  This has previously been due to ongoing back pain but she does have an Ascending Aortic Aneurysm and CHF.  Dysphagia- pt reports sxs started ~18 months ago and is worsening (increased frequency).  Occurring w/ both solids and liquids.  Had Endoscopy done w/ Dr Henrene Pastor April 2021 that was WNL.  Currently on Protonix BID but continues to have reflux.   Review of Systems For ROS see HPI   This visit occurred during the SARS-CoV-2 public health emergency.  Safety protocols were in place, including screening questions prior to the visit, additional usage of staff PPE, and extensive cleaning of exam room while observing appropriate contact time as indicated for disinfecting solutions.      Objective:   Physical Exam Vitals reviewed.  Constitutional:      General: She is not in acute distress.    Appearance: Normal appearance. She is obese. She is not ill-appearing.  HENT:     Head: Normocephalic and atraumatic.  Eyes:     Extraocular Movements: Extraocular movements intact.     Conjunctiva/sclera: Conjunctivae normal.     Pupils: Pupils are equal, round, and reactive to light.  Cardiovascular:     Rate and Rhythm: Normal rate and regular rhythm.     Pulses: Normal pulses.     Heart sounds: Normal heart sounds.  Pulmonary:     Effort: Pulmonary effort is normal. No respiratory distress.     Breath sounds: Normal breath sounds. No wheezing or rhonchi.  Abdominal:     General: There is no distension.     Palpations: There is no mass.      Tenderness: There is no abdominal tenderness. There is no guarding.  Musculoskeletal:     Cervical back: Neck supple.     Right lower leg: Edema present.     Left lower leg: Edema present.  Lymphadenopathy:     Cervical: No cervical adenopathy.  Skin:    General: Skin is warm and dry.  Neurological:     Mental Status: She is alert and oriented to person, place, and time. Mental status is at baseline.  Psychiatric:     Comments: Frustrated w/ lack of improvement in medical issues          Assessment & Plan:  Atypical CP- sxs have been ongoing daily for months.  Has seen Cardiology in the past and they believed it was musculoskeletal.  Does still have a MSK component as she has TTP.  Suspect there is also a reflux component as she is having daily sxs.  I understand she is frustrated w/ lack of progress and the sheer number of medical visits she has, but did encourage her to f/u w/ Cardiology as she has not been seen recently.  Pt expressed understanding and is in agreement w/ plan.

## 2021-03-09 NOTE — Assessment & Plan Note (Signed)
Ongoing.  Pt states she did not take the Cymbalta b/c she did not realize/remember that it would also improve her pain and not just mood.  She says if there's a chance it will help her pain, she is willing to try.  Says she has the medicine at home and will start tomorrow.  Will follow.

## 2021-03-09 NOTE — Assessment & Plan Note (Signed)
Ongoing issue.  Had EGD ~18 month ago and was WNL.  States she is still having daily reflux sxs despite PPI BID.  Discussed that the GERD may be responsible for her dysphagia and be contributing to her ongoing atypical CP.  She was hesitant to add another medication but after a long discussion is willing to add Famotidine daily to see if sxs improve.  Will follow.

## 2021-03-17 DIAGNOSIS — H43822 Vitreomacular adhesion, left eye: Secondary | ICD-10-CM | POA: Diagnosis not present

## 2021-03-17 DIAGNOSIS — H35372 Puckering of macula, left eye: Secondary | ICD-10-CM | POA: Diagnosis not present

## 2021-03-17 DIAGNOSIS — H16223 Keratoconjunctivitis sicca, not specified as Sjogren's, bilateral: Secondary | ICD-10-CM | POA: Diagnosis not present

## 2021-03-17 DIAGNOSIS — E113293 Type 2 diabetes mellitus with mild nonproliferative diabetic retinopathy without macular edema, bilateral: Secondary | ICD-10-CM | POA: Diagnosis not present

## 2021-03-17 LAB — HM DIABETES EYE EXAM

## 2021-03-18 ENCOUNTER — Encounter: Payer: Self-pay | Admitting: Family Medicine

## 2021-03-28 IMAGING — CT CT LUMBAR SPINE WITHOUT CONTRAST
1 of 7 series · 5 of 14 positions shown, 7 images · non-contrast
Comparison: CT abdomen pelvis dated December 25, 2017. MRI lumbar
spine dated May 28, 2017.

CLINICAL DATA: Chronic low back pain radiating down the right leg.
Multiple prior back surgeries.

EXAM:
CT LUMBAR SPINE WITHOUT CONTRAST
TECHNIQUE: Multidetector CT imaging of the lumbar spine was performed without
intravenous contrast administration. Multiplanar CT image
reconstructions were also generated.

[Series 3: l spine soft · axial · 0.32mm/px · z∈[-204,-66]mm · 5 of 70 slices shown, 7 images]
[im 12/70  soft-tissue]
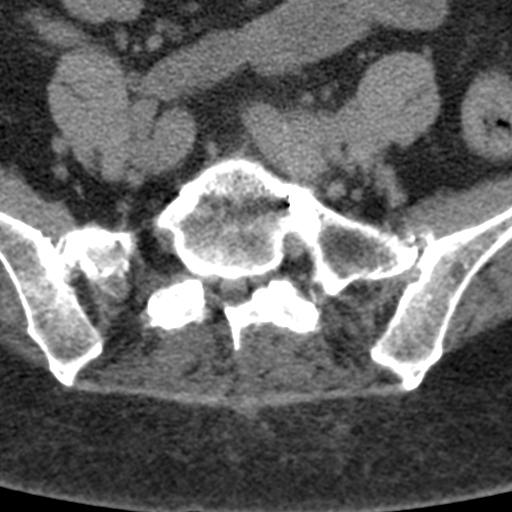
[im 12/70  bone]
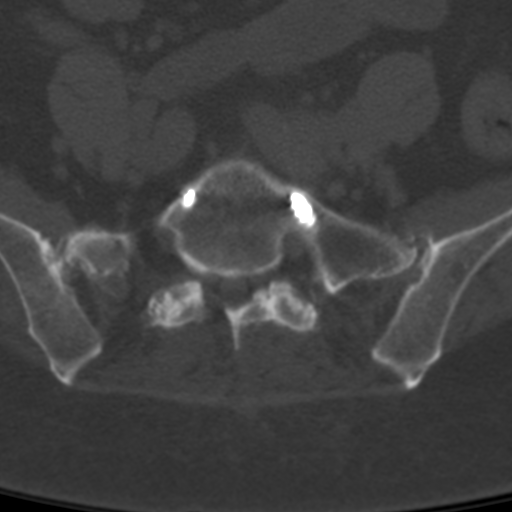
[im 24/70  bone]
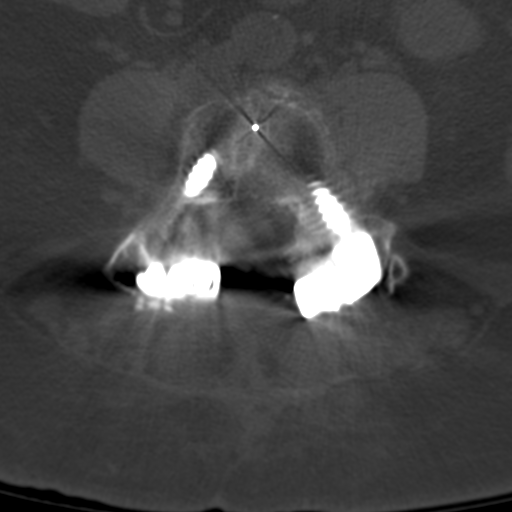
[im 35/70  bone]
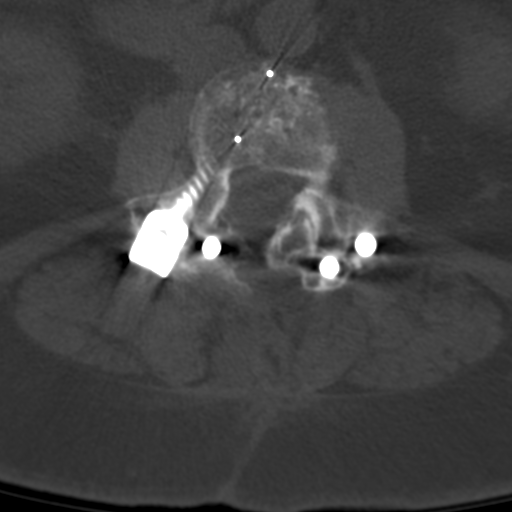
[im 47/70  bone]
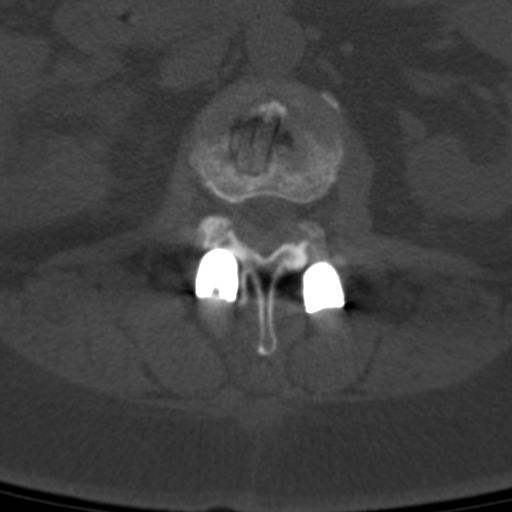
[im 58/70  soft-tissue]
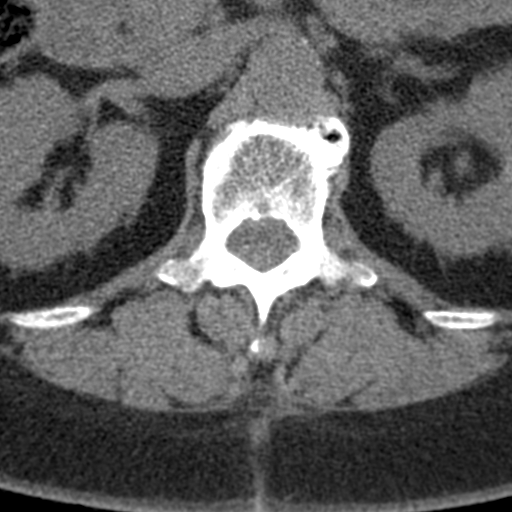
[im 58/70  bone]
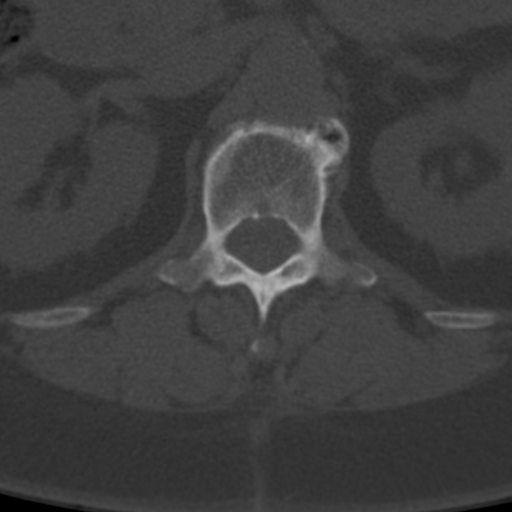

[5 of 14 positions shown; findings below may reference images not displayed]

FINDINGS: Segmentation: Unchanged partial lumbarization of S1.

Alignment: Unchanged trace retrolisthesis at L2-L3. Unchanged trace
anterolisthesis at L4-L5 and L5-S1.

Vertebrae: No acute fracture or other focal pathologic process.
Chronic mild L2 superior endplate compression fracture. Prior L2-S1
PLIF. Unchanged lucency about the bilateral L2 pedicle screws, both
of which violate the L2 superior endplate and extends into the L1-L2
disc space. Similarly, the right L3 pedicle screw violates the L3
superior endplate and extends into the peripheral aspect of the
L2-L3 disc space. Pseudoarthrosis at L2-L3 with unchanged subsidence
of the interbody graft into the L3 superior endplate. Partial
interbody fusion at the remaining levels.

Paraspinal and other soft tissues: Moderate bilateral sacroiliac
joint osteoarthritis. Aortoiliac atherosclerotic vascular disease.

Disc levels:

T12-L1:  Bulky central posterior endplate osteophytes.  No stenosis.

L1-L2: Unchanged prominent posterior disc osteophyte complex and
mild bilateral facet arthropathy. Unchanged mild spinal canal
stenosis. No neuroforaminal stenosis.

L2-L3: Prior posterior fusion. Small central posterior disc
osteophyte complex. Unchanged mild right neuroforaminal stenosis. No
spinal canal or left neuroforaminal stenosis.

L3-L4:  Prior posterior decompression and fusion.  No stenosis.

L4-L5:  Prior posterior decompression and fusion.  No stenosis.

L5-S1:  Prior posterior decompression and fusion.  No stenosis.
IMPRESSION: 1. Prior L2-S1 fusion with unchanged bilateral L2 and right L3
pedicle screws violating their respective superior endplates.
Unchanged loosening of the bilateral L2 pedicle screws.
2. Unchanged pseudoarthrosis at L2-L3.
3. Unchanged chronic L2 compression deformity.

## 2021-04-01 ENCOUNTER — Encounter: Payer: Self-pay | Admitting: Family Medicine

## 2021-04-01 ENCOUNTER — Ambulatory Visit (INDEPENDENT_AMBULATORY_CARE_PROVIDER_SITE_OTHER): Payer: Medicare HMO | Admitting: Family Medicine

## 2021-04-01 VITALS — BP 120/60 | HR 82 | Temp 97.6°F | Resp 17 | Wt 203.4 lb

## 2021-04-01 DIAGNOSIS — G43909 Migraine, unspecified, not intractable, without status migrainosus: Secondary | ICD-10-CM | POA: Diagnosis not present

## 2021-04-01 MED ORDER — SUMATRIPTAN SUCCINATE 50 MG PO TABS: 50.0000 mg | ORAL_TABLET | ORAL | 0 refills | Status: DC | PRN

## 2021-04-01 MED ORDER — ONDANSETRON HCL 4 MG PO TABS: 4.0000 mg | ORAL_TABLET | Freq: Three times a day (TID) | ORAL | 1 refills | Status: DC | PRN

## 2021-04-01 NOTE — Assessment & Plan Note (Signed)
Pt has hx of migraines but has not had one in 'years'.  Admits this feels similar to when she used to get them.  Today she is much better but still has a frontal headache and some R sided head and neck pain.  Will start Imitrex prn and refilled Zofran.  Reviewed supportive care and red flags that should prompt return.  Pt expressed understanding and is in agreement w/ plan.

## 2021-04-01 NOTE — Progress Notes (Signed)
° °  Subjective:    Patient ID: [name], female    DOB: [birth year], 70 y.o.   MRN: [id number]  HPI Headache- 'I feel better today'.  Continues to have some frontal pressure w/ pain along R side of head.  Sxs started Tuesday.  Worsened yesterday.  No relief w/ Tylenol, sinus medication.  Had associated N/V.  Very sensitive to light and sound.  'this has happened before'.  Pt has hx of migraines.  Has pain and echo when talking.    Review of Systems For ROS see HPI   This visit occurred during the SARS-CoV-2 public health emergency.  Safety protocols were in place, including screening questions prior to the visit, additional usage of staff PPE, and extensive cleaning of exam room while observing appropriate contact time as indicated for disinfecting solutions.      Objective:   Physical Exam Vitals reviewed.  Constitutional:      General: She is not in acute distress.    Appearance: She is well-developed. She is not ill-appearing.  HENT:     Head: Normocephalic and atraumatic.  Eyes:     Extraocular Movements: Extraocular movements intact.     Pupils: Pupils are equal, round, and reactive to light.     Comments: Sensitive to light  Abdominal:     General: There is no distension.     Palpations: Abdomen is soft.     Tenderness: There is no abdominal tenderness.  Musculoskeletal:     Cervical back: Normal range of motion and neck supple. No rigidity.  Skin:    General: Skin is warm and dry.  Neurological:     Mental Status: She is alert and oriented to person, place, and time. Mental status is at baseline.     Cranial Nerves: No cranial nerve deficit, dysarthria or facial asymmetry.     Coordination: Coordination normal.     Gait: Gait normal.  Psychiatric:        Mood and Affect: Mood normal.        Speech: Speech normal.        Behavior: Behavior normal.          Assessment & Plan:

## 2021-04-01 NOTE — Patient Instructions (Signed)
Follow up as needed or as scheduled At the first sign of headache, take OTC medication like Tylenol or Excedrin If no improvement in 20-30 minutes, take the Sumatriptan Use the Ondansetron as needed for nausea Make sure you are drinking LOTS of water If the headache is really bad, try and sleep it off Call with any questions or concerns Hang in there! Happy Holidays!!!

## 2021-04-22 ENCOUNTER — Other Ambulatory Visit: Payer: Self-pay | Admitting: Family

## 2021-04-22 ENCOUNTER — Ambulatory Visit: Payer: Medicare HMO | Admitting: Family Medicine

## 2021-04-27 ENCOUNTER — Other Ambulatory Visit: Payer: Self-pay | Admitting: Thoracic Surgery (Cardiothoracic Vascular Surgery)

## 2021-04-27 DIAGNOSIS — I713 Abdominal aortic aneurysm, ruptured, unspecified: Secondary | ICD-10-CM

## 2021-04-27 DIAGNOSIS — I7121 Aneurysm of the ascending aorta, without rupture: Secondary | ICD-10-CM

## 2021-04-28 ENCOUNTER — Other Ambulatory Visit: Payer: Self-pay | Admitting: Family Medicine

## 2021-05-05 ENCOUNTER — Ambulatory Visit: Payer: Medicare HMO | Admitting: Podiatry

## 2021-05-05 ENCOUNTER — Encounter: Payer: Self-pay | Admitting: Podiatry

## 2021-05-05 ENCOUNTER — Other Ambulatory Visit: Payer: Self-pay

## 2021-05-05 DIAGNOSIS — M2010 Hallux valgus (acquired), unspecified foot: Secondary | ICD-10-CM | POA: Diagnosis not present

## 2021-05-05 DIAGNOSIS — L84 Corns and callosities: Secondary | ICD-10-CM | POA: Diagnosis not present

## 2021-05-05 DIAGNOSIS — M2042 Other hammer toe(s) (acquired), left foot: Secondary | ICD-10-CM | POA: Diagnosis not present

## 2021-05-05 NOTE — Progress Notes (Addendum)
This patient presents to the office for evaluation and treatment of her second toe left foot.  She says she has developed a painful corn on the inside of  her second toe left foot.  It comes and goes.  She presents for evaluation and treatment of her corn.  Patient has history of diabetic neuropathy and kidney disease.  General Appearance  Alert, conversant and in no acute stress.  Vascular  Dorsalis pedis and posterior tibial  pulses are palpable  bilaterally.  Capillary return is within normal limits  bilaterally. Temperature is within normal limits  bilaterally.  Neurologic  Senn-Weinstein monofilament wire test within normal limits  bilaterally. Muscle power within normal limits bilaterally.  Nails Thick disfigured discolored nails with subungual debris  from hallux to fifth toes bilaterally. No evidence of bacterial infection or drainage bilaterally.  Orthopedic  No limitations of motion  feet .  No crepitus or effusions noted.  No bony pathology or digital deformities noted.  HAV  B/L.  Hammer toes  2nd left foot.  Skin  normotropic skin with no porokeratosis noted bilaterally.  No signs of infections or ulcers noted.  Corn noted on the medial aspect DIPJ second toe left foot.  Corn secondary to HAV left foot and hammer toe second toe left foot  ROV.  Debride corn second toe left foot.  Padding dispensed to separate her 1/2 toes  left foot. Discussed her corn formation nand told her the bunion was buttressing against her second toe forming her corn.  Nails were done complementary.  Padding dispensed.  RTC   3 months   Gardiner Barefoot DPM

## 2021-05-13 ENCOUNTER — Other Ambulatory Visit: Payer: Self-pay

## 2021-05-13 NOTE — Patient Outreach (Signed)
[facility]) Care Management  05/13/2021  ALLYNE HEBERT 06/25/1950 [id number]   Case Closure    Case has been transferred to [place] program and patient to be followed by assigned RN CM at PCP office.   Plan: RN CM will close case at this time.   Enzo Montgomery, RN,BSN,CCM [place] [facility] Telephonic Care Management Coordinator Direct Phone: [phone] Toll Free: [phone] Fax: [phone]

## 2021-05-14 ENCOUNTER — Ambulatory Visit: Payer: Self-pay

## 2021-05-18 ENCOUNTER — Telehealth: Payer: Self-pay | Admitting: Pharmacist

## 2021-05-18 NOTE — Progress Notes (Signed)
Chronic Care Management Pharmacy Assistant   Name: [name]  MRN: [id number] DOB: 1951-01-11   Reason for Encounter: Disease State - General Adherence Call     Recent office visits:  03/12/21 Annye Asa, MD - Family Medicine - Migraine - SUMAtriptan (IMITREX) 50 MG tablet Take 1 tablet (50 mg total) by mouth every 2 (two) hours as needed for migraine prescribed. Follow up as needed.   03/08/21 Annye Asa, MD - Family Medicine - Depression - famotidine (PEPCID) 40 MG tablet prescribed. ADD the Famotidine once daily in addition to the twice daily Pantoprazole. Follow up in 6 weeks.   Recent consult visits:  05/05/21 Gardiner Barefoot, DPM - Podiatry - Hammer toe -  Debride corn second toe left foot.  Padding dispensed to separate her 1/2 toes  left foot. Discussed her corn formation nand told her the bunion was buttressing against her second toe forming her corn.  Nails were done complementary.  Padding dispensed. Follow up in 3 months.   03/17/21 Gala Romney, MD - Ophthalmology - Keratoconjunctivitis sicca - No notes available.    Hospital visits:  None in previous 6 months  Medications: Outpatient Encounter Medications as of 05/18/2021  Medication Sig   acetaminophen (TYLENOL) 500 MG tablet Take 500-1,000 mg by mouth every 6 (six) hours as needed for moderate pain, headache or mild pain.   Alcohol Swabs (B-D SINGLE USE SWABS REGULAR) PADS Use as directed.   amLODipine (NORVASC) 10 MG tablet Take 1 tablet (10 mg total) by mouth daily.   aspirin 81 MG tablet Take 81 mg by mouth daily.   Blood Glucose Monitoring Suppl (TRUE METRIX AIR GLUCOSE METER) w/Device KIT 1 kit by Does not apply route 3 (three) times daily.   carvedilol (COREG) 6.25 MG tablet Take 1 tablet (6.25 mg total) by mouth 2 (two) times daily with a meal.   famotidine (PEPCID) 40 MG tablet Take 1 tablet (40 mg total) by mouth daily.   ferrous gluconate (FERGON) 324 MG tablet TAKE 1 TABLET (324 MG TOTAL)  BY MOUTH 2 (TWO) TIMES DAILY WITH A MEAL.   glipiZIDE (GLUCOTROL XL) 5 MG 24 hr tablet TAKE 1 TABLET BY MOUTH EVERY DAY WITH BREAKFAST   glucose blood (TRUE METRIX BLOOD GLUCOSE TEST) test strip Use as instructed   hydrALAZINE (APRESOLINE) 50 MG tablet Take 1 tablet (50 mg total) by mouth 3 (three) times daily.   insulin NPH-regular Human (NOVOLIN 70/30 RELION) (70-30) 100 UNIT/ML injection Inject 45 Units into the skin 2 (two) times daily with a meal.   levothyroxine (SYNTHROID) 100 MCG tablet Take 1 tablet (100 mcg total) by mouth daily.   losartan (COZAAR) 25 MG tablet Take 1 tablet (25 mg total) by mouth daily.   metoCLOPramide (REGLAN) 10 MG tablet TAKE 1 TABLET FOUR TIMES DAILY   ondansetron (ZOFRAN) 4 MG tablet Take 1 tablet (4 mg total) by mouth every 8 (eight) hours as needed for nausea or vomiting.   pantoprazole (PROTONIX) 40 MG tablet Take 1 tablet (40 mg total) by mouth 2 (two) times daily.   polyethylene glycol (MIRALAX / GLYCOLAX) packet Take 17 g by mouth daily as needed for moderate constipation (MIX AND DRINK).   rosuvastatin (CRESTOR) 20 MG tablet Take 1 tablet (20 mg total) by mouth at bedtime.   SUMAtriptan (IMITREX) 50 MG tablet TAKE 1 TAB EVERY 2 HRS AS NEEDED FOR MIGRAINE. MAY REPEAT IN 2 HOURS IF HEADACHE PERSISTS OR RECURS.   torsemide (DEMADEX) 20 MG  tablet Take 1 tablet (20 mg total) by mouth 2 (two) times daily.   TRUEplus Lancets 33G MISC Use as directed   No facility-administered encounter medications on file as of 05/18/2021.    Have you had any problems recently with your health? Patient denied any problems with her health.   Have you had any problems with your pharmacy? Patient denied any problems with her pharmacy.   What issues or side effects are you having with your medications? Patient denied any side effects or issues with her current medications.   What would you like me to pass along to Leata Mouse, [name] for them to help you with?  Patient did not  have anything to pass along to [name] at this time. A phone follow up was scheduled for 06/09/21 @ 1:30pm.  What can we do to take care of you better? Patient did not have any recommendations at this time.   Care Gaps  AWV: done 06/26/19 Colonoscopy: done 06/10/03 DM Eye Exam: due 03/17/22 DM Foot Exam: due 02/01/22 Microalbumin: done 08/05/19 HbgAIC: done 10/28/20 (9.9) DEXA: done 03/25/20 Mammogram: done 08/25/20   Star Rating Drugs: rosuvastatin (CRESTOR) 20 MG tablet - last filled 03/08/21 90 days  losartan (COZAAR) 25 MG tablet - last filled 01/19/21 90 days  glipiZIDE (GLUCOTROL XL) 5 MG 24 hr tablet - last filled 02/08/21 90 days   Future Appointments  Date Time Provider [place]  06/01/2021  2:40 PM [facility] 1 [facility]  06/01/2021  3:30 PM [facility] [place] [facility]  06/09/2021  1:30 PM [facility] PHARMACIST [facility]  08/04/2021 11:15 AM Gardiner Barefoot, DPM [facility]     Jobe Gibbon, [place] Pharmacist Assistant  [phone]

## 2021-05-19 IMAGING — MR MR LUMBAR SPINE W/O CM
5 series · 41 of 48 positions shown · non-contrast
Comparison: CT 12/05/2018.  MRI 05/28/2017.

CLINICAL DATA: Previous lumbar fusion surgeries. Low back pain
radiating to the right buttock and right leg and to the left hip.
Symptoms worsening.

EXAM:
MRI LUMBAR SPINE WITHOUT CONTRAST
TECHNIQUE: Multiplanar, multisequence MR imaging of the lumbar spine was
performed. No intravenous contrast was administered.

[Series 3: T2 post-contrast · sagittal · 4.0mm · 0.88mm/px · 6 of 12 slices shown]
[im 1/12]
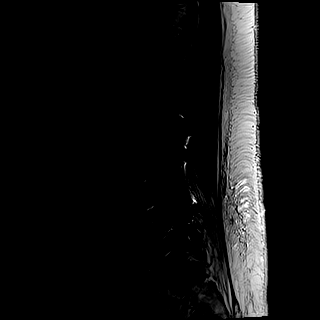
[im 3/12]
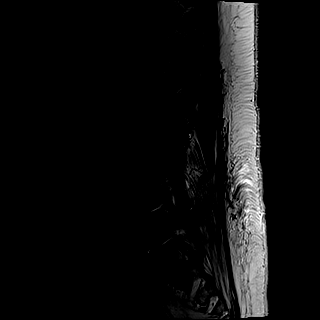
[im 5/12]
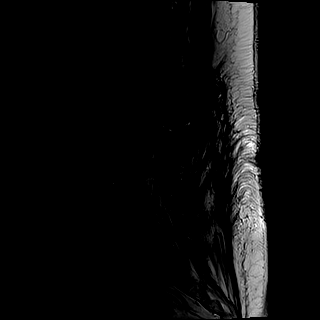
[im 7/12]
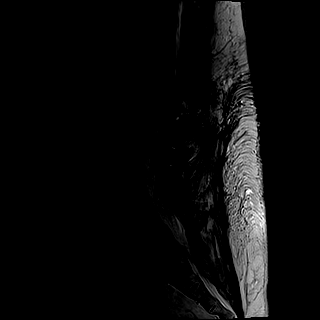
[im 9/12]
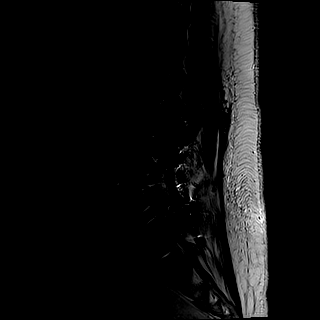
[im 12/12]
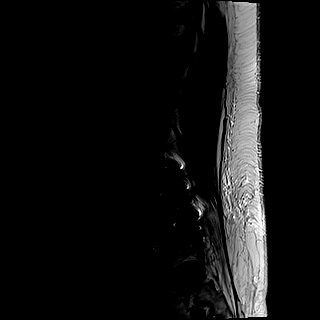

[Series 4: T1 · sagittal · 4.0mm · 0.88mm/px · 6 of 12 slices shown (1 of 2)]
[im 1/12]
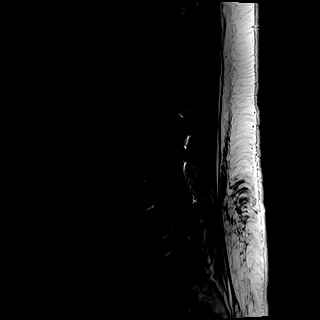
[im 3/12]
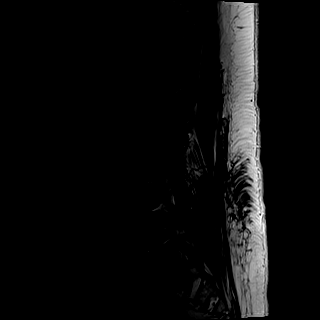
[im 5/12]
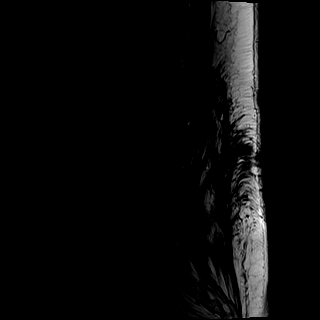
[im 7/12]
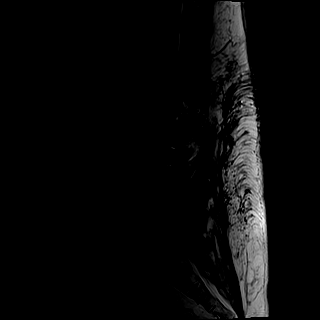
[im 9/12]
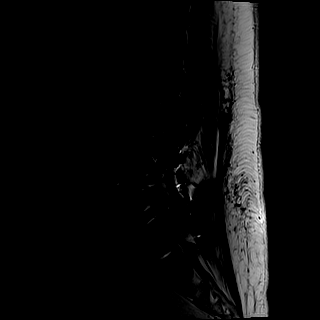
[im 12/12]
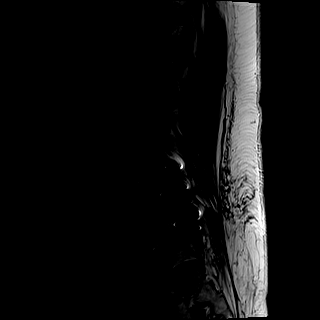

[Series 5: tirm sag · sagittal · 4.0mm · 0.55mm/px · 6 of 12 slices shown]
[im 1/12]
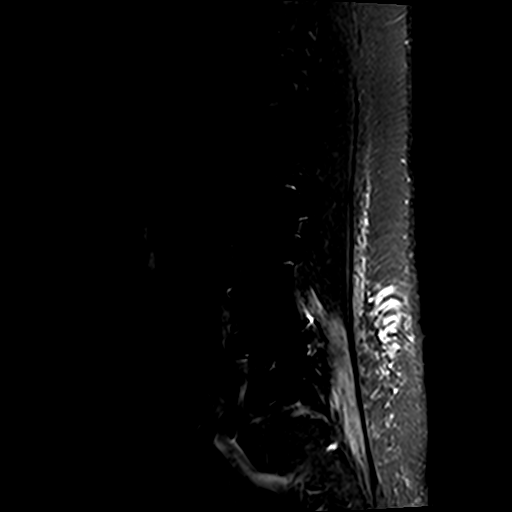
[im 3/12]
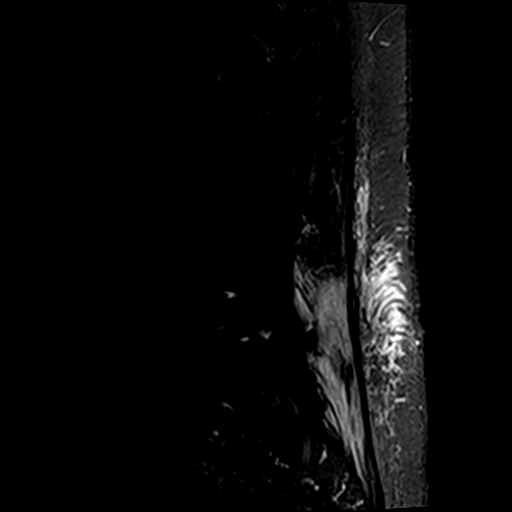
[im 5/12]
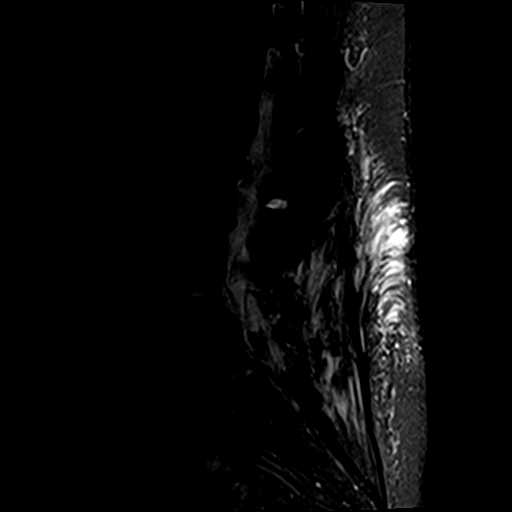
[im 7/12]
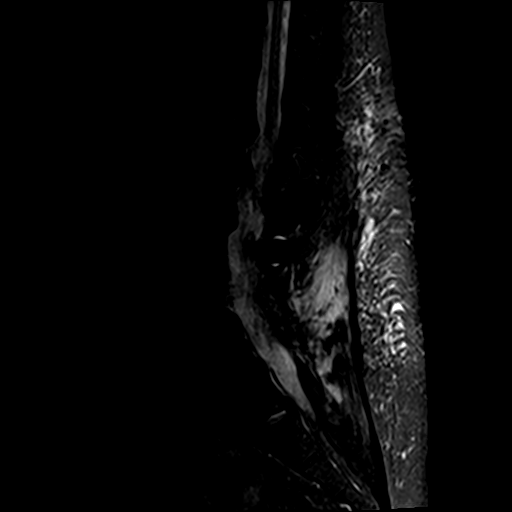
[im 9/12]
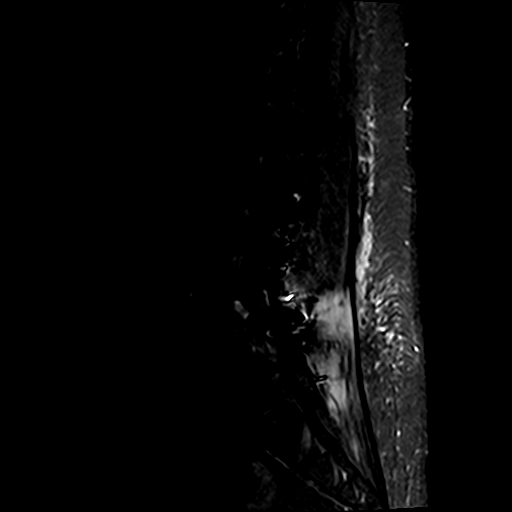
[im 12/12]
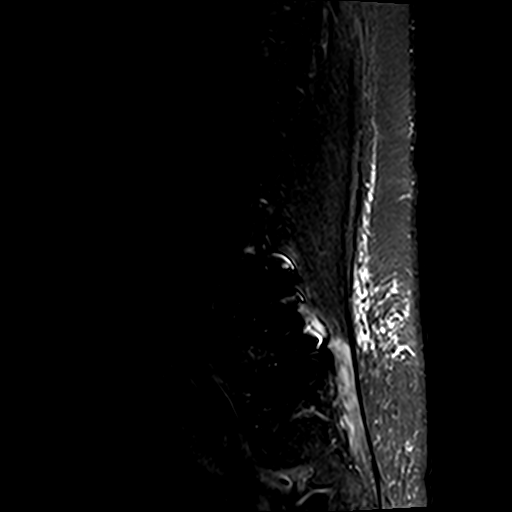

[Series 6: T1 · axial · 4.0mm · 0.78mm/px · z∈[-7,+181]mm · 9 of 32 slices shown (2 of 2)]
[im 1/32]
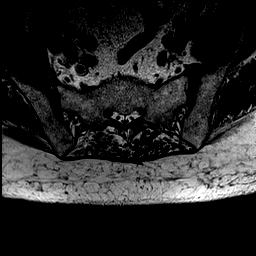
[im 5/32]
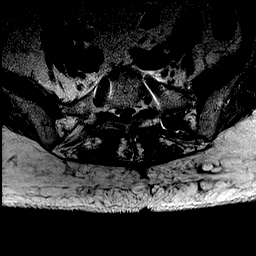
[im 9/32]
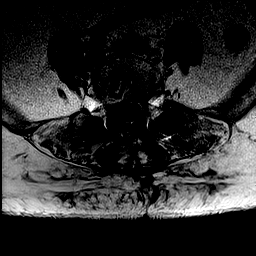
[im 14/32]
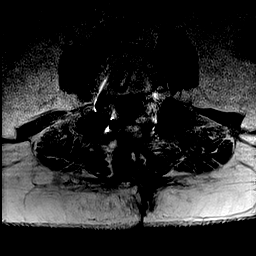
[im 16/32]
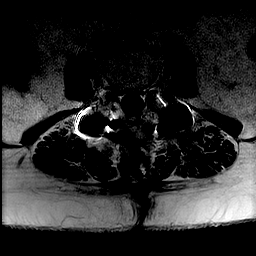
[im 18/32]
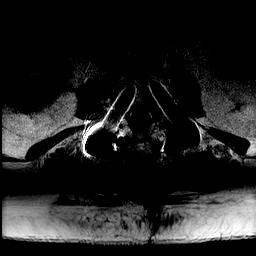
[im 23/32]
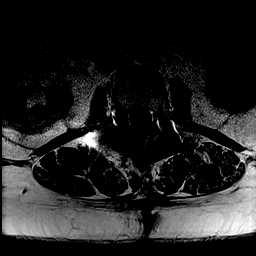
[im 27/32]
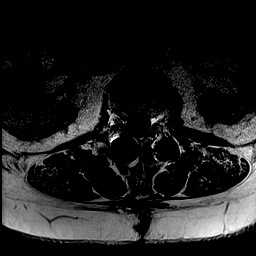
[im 32/32]
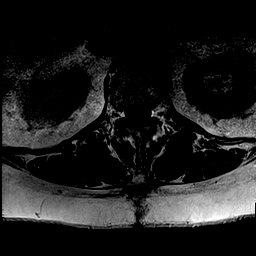

[Series 7: T2 · axial · 4.0mm · 0.78mm/px · z∈[-7,+181]mm · 14 of 32 slices shown]
[im 1/32]
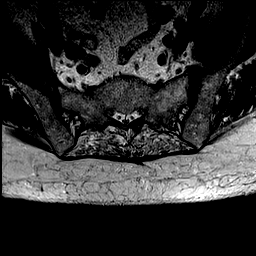
[im 3/32]
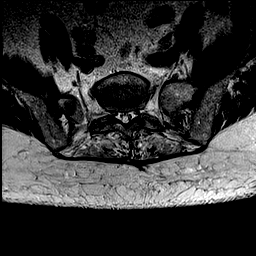
[im 5/32]
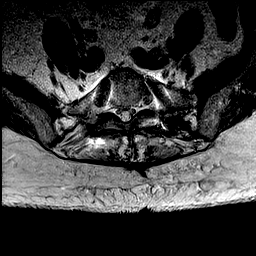
[im 7/32]
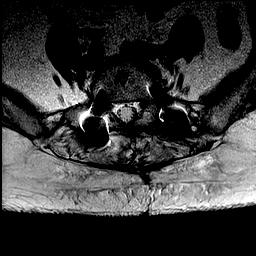
[im 9/32]
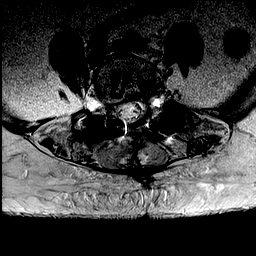
[im 12/32]
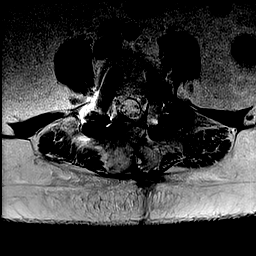
[im 14/32]
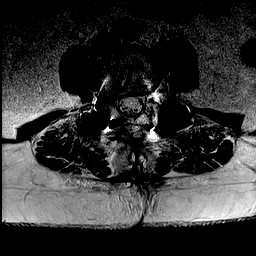
[im 16/32]
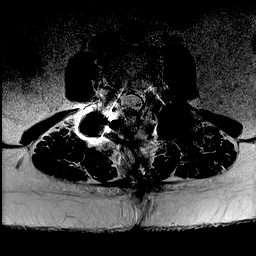
[im 18/32]
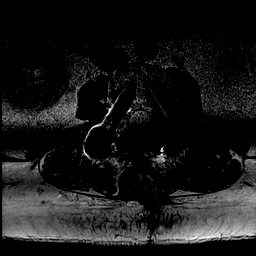
[im 20/32]
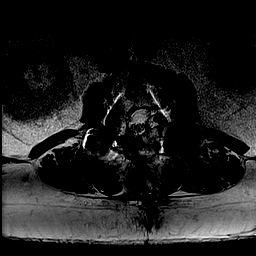
[im 23/32]
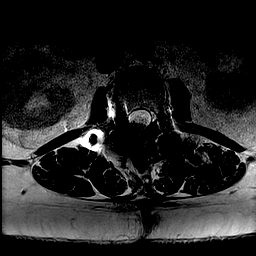
[im 25/32]
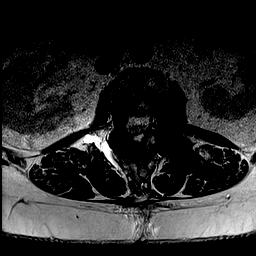
[im 27/32]
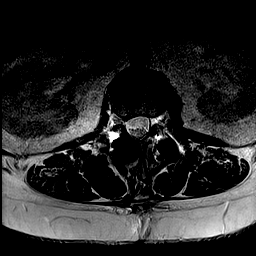
[im 32/32]
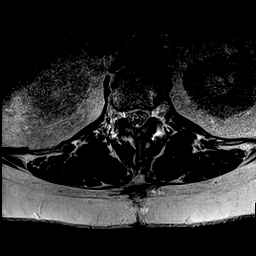

[41 of 48 positions shown; findings below may reference images not displayed]

FINDINGS: Segmentation: Same numbering terminology used previously. Previous
surgery from L2-S1.

Alignment:  3 mm retrolisthesis L1-2 and L2-3.

Vertebrae: Old partial compression fracture at L2. Posterior fusion
hardware from L2-S1.

Conus medullaris and cauda equina: Conus extends to the L1-2 level.
Conus and cauda equina appear normal.

Paraspinal and other soft tissues: Negative except for frequently
seen neurogenic changes of the posterior paraspinous musculature
subsequent to the previous surgery.

Disc levels:

T10-11 and T11-12 are normal.

T12-L1: Shallow disc herniation with caudal migration as seen
previously. This indents the ventral thecal sac but does not appear
to cause neural compression.

L1-2: 3 mm retrolisthesis. Endplate osteophytes and broad-based
calcified disc herniation with slight caudal migration. As seen
previously, pedicle screws at L2 breach the superior endplate and
could contribute to disc degeneration. Mild stenosis of the lateral
recesses and foramina but without gross neural compression.

L2-3: 3 mm retrolisthesis. Endplate osteophytes and bulging of the
disc. Good posterior decompression with sufficient patency of the
canal and foramina.

L3 to S1: Previous posterior decompression and fusion with solid
union. Sufficient patency of the canal and foramina through that
region.
IMPRESSION: Previous discectomy and fusion surgery from L2 to the sacrum. Solid
union from L3 through S1 with sufficient patency of the canal and
foramina.

At L2-3, there may be ongoing nonunion, but there does not appear to
be any compressive stenosis of the canal or foramina. No unexpected
bone edema. Mild spacer subsidence. Old minor compression deformity
of L2.

At L1-2, there is a chronic calcified disc herniation with slight
caudal migration. Pedicle screws at L2 do breach the superior
endplate of L2, entering the disc space. Mild canal and foraminal
narrowing but no gross neural compression or progressive changes
since the previous exams.

At T12-L1, there is a chronic calcified central disc herniation with
caudal migration that does not appear to cause neural compression.

I do not see a distinct explanation for changing or worsening pain.

## 2021-05-28 DIAGNOSIS — N1832 Chronic kidney disease, stage 3b: Secondary | ICD-10-CM | POA: Diagnosis not present

## 2021-06-01 ENCOUNTER — Ambulatory Visit: Payer: Medicare HMO

## 2021-06-01 ENCOUNTER — Other Ambulatory Visit: Payer: Self-pay | Admitting: Thoracic Surgery (Cardiothoracic Vascular Surgery)

## 2021-06-01 ENCOUNTER — Ambulatory Visit
Admission: RE | Admit: 2021-06-01 | Discharge: 2021-06-01 | Disposition: A | Discharge status: Home / Self Care | Payer: Medicare HMO | Source: Ambulatory Visit | Attending: Thoracic Surgery (Cardiothoracic Vascular Surgery) | Admitting: Thoracic Surgery (Cardiothoracic Vascular Surgery)

## 2021-06-01 DIAGNOSIS — I713 Abdominal aortic aneurysm, ruptured, unspecified: Secondary | ICD-10-CM

## 2021-06-01 DIAGNOSIS — I7121 Aneurysm of the ascending aorta, without rupture: Secondary | ICD-10-CM

## 2021-06-03 ENCOUNTER — Inpatient Hospital Stay: Admission: RE | Admit: 2021-06-03 | Payer: Medicare HMO | Source: Ambulatory Visit

## 2021-06-03 ENCOUNTER — Other Ambulatory Visit: Payer: Self-pay | Admitting: Family Medicine

## 2021-06-03 ENCOUNTER — Ambulatory Visit: Payer: Medicare HMO

## 2021-06-03 DIAGNOSIS — R809 Proteinuria, unspecified: Secondary | ICD-10-CM | POA: Diagnosis not present

## 2021-06-03 DIAGNOSIS — I5032 Chronic diastolic (congestive) heart failure: Secondary | ICD-10-CM | POA: Diagnosis not present

## 2021-06-03 DIAGNOSIS — I272 Pulmonary hypertension, unspecified: Secondary | ICD-10-CM | POA: Diagnosis not present

## 2021-06-03 DIAGNOSIS — E1122 Type 2 diabetes mellitus with diabetic chronic kidney disease: Secondary | ICD-10-CM | POA: Diagnosis not present

## 2021-06-03 DIAGNOSIS — N1832 Chronic kidney disease, stage 3b: Secondary | ICD-10-CM | POA: Diagnosis not present

## 2021-06-03 DIAGNOSIS — R803 Bence Jones proteinuria: Secondary | ICD-10-CM | POA: Diagnosis not present

## 2021-06-03 DIAGNOSIS — R768 Other specified abnormal immunological findings in serum: Secondary | ICD-10-CM | POA: Diagnosis not present

## 2021-06-03 DIAGNOSIS — I129 Hypertensive chronic kidney disease with stage 1 through stage 4 chronic kidney disease, or unspecified chronic kidney disease: Secondary | ICD-10-CM | POA: Diagnosis not present

## 2021-06-09 ENCOUNTER — Ambulatory Visit (INDEPENDENT_AMBULATORY_CARE_PROVIDER_SITE_OTHER): Payer: Medicare HMO | Admitting: Pharmacist

## 2021-06-09 DIAGNOSIS — E1169 Type 2 diabetes mellitus with other specified complication: Secondary | ICD-10-CM

## 2021-06-09 DIAGNOSIS — I10 Essential (primary) hypertension: Secondary | ICD-10-CM

## 2021-06-09 NOTE — Progress Notes (Signed)
Chronic Care Management Pharmacy Note Summary: PharmD follow up.  Patient's main issue is adherence.  She has admittedly not been taking most of her oral medications on regular basis.  Will organize in pill box but get tired and choose not to take for an extended period of time.  Will follow regularly and encourage her to take so we can properly adjust meds.   06/10/2021 Name:  [name] MRN:  [id number] DOB:  1950/06/20  Subjective: [name] is an 71 y.o. year old female who is a primary patient of Tabori, Aundra Millet, MD.  The CCM team was consulted for assistance with disease management and care coordination needs.    Engaged with patient by telephone for follow up visit in response to provider referral for pharmacy case management and/or care coordination services.   Consent to Services:  The patient was given information about Chronic Care Management services, agreed to services, and gave verbal consent prior to initiation of services.  Please see initial visit note for detailed documentation.   Patient Care Team: [name], MD as PCP - General (Family Medicine) [name], MD as PCP - Cardiology (Cardiology) Cassandria Anger, MD as Consulting Physician (Endocrinology) [name], DPM as Consulting Physician (Podiatry) [name], MD as Consulting Physician (Pulmonary Disease) [name], MD as Consulting Physician (Ophthalmology) Claudia Desanctis, MD as Consulting Physician (Nephrology) Edythe Clarity, [facility] (Pharmacist)  Objective:  Lab Results  Component Value Date   CREATININE 1.43 (H) 02/05/2021   CREATININE 2.05 (H) 07/06/2020   CREATININE 2.01 (H) 06/02/2020    Lab Results  Component Value Date   HGBA1C 9.9 (A) 10/28/2020   Last diabetic Eye exam:  Lab Results  Component Value Date/Time   HMDIABEYEEXA Retinopathy (A) 03/17/2021 12:00 AM    Last diabetic Foot exam:  Lab Results  Component Value  Date/Time   HMDIABFOOTEX yes 09/21/2009 12:00 AM        Component Value Date/Time   CHOL 162 02/05/2021 0947   TRIG 210 (H) 02/05/2021 0947   HDL 51 02/05/2021 0947   CHOLHDL 3.2 06/02/2020 0816   VLDL 42 (H) 04/17/2019 0227   LDLCALC 76 02/05/2021 0947   LDLCALC 60 06/02/2020 0816    Hepatic Function Latest Ref Rng & Units 02/05/2021 07/06/2020 06/02/2020  Total Protein 6.0 - 8.5 g/dL 6.7 8.0 7.6  Albumin 3.7 - 4.7 g/dL 3.9 4.0 -  AST 0 - 40 IU/L '12 18 14  ' ALT 0 - 32 IU/L '10 15 11  ' Alk Phosphatase 44 - 121 IU/L 58 55 -  Total Bilirubin 0.0 - 1.2 mg/dL 0.4 0.6 0.3  Bilirubin, Direct 0.00 - 0.40 mg/dL 0.14 - -    Lab Results  Component Value Date/Time   TSH 0.417 (L) 10/23/2020 01:32 PM   TSH 1.35 06/02/2020 08:16 AM   FREET4 1.29 10/23/2020 01:32 PM   FREET4 1.2 06/02/2020 08:16 AM    CBC Latest Ref Rng & Units 02/05/2021 07/06/2020 10/28/2019  WBC 3.4 - 10.8 x10E3/uL 6.7 8.9 8.9  Hemoglobin 11.1 - 15.9 g/dL 13.5 12.0 11.5(L)  Hematocrit 34.0 - 46.6 % 42.6 37.9 34.6(L)  Platelets 150 - 450 x10E3/uL 288 291 340.0    Lab Results  Component Value Date/Time   VD25OH 15 (L) 06/02/2020 08:16 AM   VD25OH 17 (L) 10/02/2018 11:59 AM   Clinical ASCVD:  The 10-year ASCVD risk score (Arnett DK, et al., 2019) is: 21.7%   Values  used to calculate the score:     Age: 71 years     Sex: Female     Is Non-Hispanic African American: Yes     Diabetic: Yes     Tobacco smoker: No     Systolic Blood Pressure: 498 mmHg     Is BP treated: Yes     HDL Cholesterol: 51 mg/dL     Total Cholesterol: 162 mg/dL     Social History   Tobacco Use  Smoking Status Never  Smokeless Tobacco Never   BP Readings from Last 3 Encounters:  04/01/21 120/60  03/08/21 108/60  02/01/21 (!) 142/78   Pulse Readings from Last 3 Encounters:  04/01/21 82  03/08/21 65  02/01/21 62   Wt Readings from Last 3 Encounters:  04/01/21 203 lb 6.4 oz (92.3 kg)  03/08/21 201 lb 6.4 oz (91.4 kg)  02/01/21  201 lb 6.4 oz (91.4 kg)    Assessment: Review of patient past medical history, allergies, medications, health status, including review of consultants reports, laboratory and other test data, was performed as part of comprehensive evaluation and provision of chronic care management services.   SDOH:  (Social Determinants of Health) assessments and interventions performed: Yes   CCM Care Plan  Allergies  Allergen Reactions   Dilaudid [Hydromorphone Hcl] Nausea And Vomiting   Hydromorphone Nausea And Vomiting   Lipitor [Atorvastatin] Other (See Comments)    Body & Muscle Aches   Adhesive [Tape] Rash   Ampicillin Rash and Other (See Comments)    Also developed welts on her hands when she took it in the hospital   Latex Itching, Dermatitis, Rash and Other (See Comments)    Patient had a reaction to tape after surgery and they told her she was allergic to Latex    Penicillins Rash and Other (See Comments)    Has patient had a PCN reaction causing immediate rash, facial/tongue/throat swelling, SOB or lightheadedness with hypotension: No Has patient had a PCN reaction causing severe rash involving mucus membranes or skin necrosis: No Has patient had a PCN reaction that required hospitalization: Yes - in hospital receiving treatment Has patient had a PCN reaction occurring within the last 10 years: No If all of the above answers are "NO", then may proceed with Cephalosporin use.     Medications Reviewed Today     Reviewed by Edythe Clarity, [facility] (Pharmacist) on 06/10/21 at 1215  Med List Status: <None>   Medication Order Taking? Sig Documenting Provider Last Dose Status Informant  acetaminophen (TYLENOL) 500 MG tablet [id number] Yes Take 500-1,000 mg by mouth every 6 (six) hours as needed for moderate pain, headache or mild pain. [provider] Taking Active Self  Alcohol Swabs (B-D SINGLE USE SWABS REGULAR) PADS [id number] Yes Use as directed. Dutch Quint B, FNP Taking  Active   amLODipine (NORVASC) 10 MG tablet [id number] Yes Take 1 tablet (10 mg total) by mouth daily. Dutch Quint B, FNP Taking Active   aspirin 81 MG tablet [id number] Yes Take 81 mg by mouth daily. [provider] Taking Active Self  Blood Glucose Monitoring Suppl (TRUE METRIX AIR GLUCOSE METER) w/Device KIT [id number] Yes 1 kit by Does not apply route 3 (three) times daily. [name], MD Taking Active Self  carvedilol (COREG) 6.25 MG tablet [id number] Yes Take 1 tablet (6.25 mg total) by mouth 2 (two) times daily with a meal. Dutch Quint B, FNP Taking Active   famotidine (PEPCID) 40 MG tablet [id number] Yes  TAKE 1 TABLET BY MOUTH EVERY DAY [name], MD Taking Active   ferrous gluconate (FERGON) 324 MG tablet [id number] Yes TAKE 1 TABLET (324 MG TOTAL) BY MOUTH 2 (TWO) TIMES DAILY WITH A MEAL. Julian Hy, DO Taking Active Self  glipiZIDE (GLUCOTROL XL) 5 MG 24 hr tablet [id number] Yes TAKE 1 TABLET BY MOUTH EVERY DAY WITH BREAKFAST Dutch Quint B, FNP Taking Active   glucose blood (TRUE METRIX BLOOD GLUCOSE TEST) test strip [id number] Yes Use as instructed Kennyth Arnold, FNP Taking Active   hydrALAZINE (APRESOLINE) 50 MG tablet [id number] Yes Take 1 tablet (50 mg total) by mouth 3 (three) times daily. Kennyth Arnold, FNP Taking Active   insulin NPH-regular Human (NOVOLIN 70/30 RELION) (70-30) 100 UNIT/ML injection [id number] Yes Inject 45 Units into the skin 2 (two) times daily with a meal. Dutch Quint B, FNP Taking Active   levothyroxine (SYNTHROID) 100 MCG tablet [id number] Yes Take 1 tablet (100 mcg total) by mouth daily. Dutch Quint B, FNP Taking Active   losartan (COZAAR) 25 MG tablet [id number] Yes Take 1 tablet (25 mg total) by mouth daily. Dutch Quint B, FNP Taking Active   metoCLOPramide (REGLAN) 10 MG tablet [id number] Yes TAKE 1 TABLET FOUR TIMES DAILY Dutch Quint B, FNP Taking Active   ondansetron (ZOFRAN) 4 MG tablet [id number] Yes Take 1 tablet (4 mg  total) by mouth every 8 (eight) hours as needed for nausea or vomiting. [name], MD Taking Active   pantoprazole (PROTONIX) 40 MG tablet [id number] Yes Take 1 tablet (40 mg total) by mouth 2 (two) times daily. Dutch Quint B, FNP Taking Active   polyethylene glycol Merril Abbe / GLYCOLAX) packet [id number] Yes Take 17 g by mouth daily as needed for moderate constipation (MIX AND DRINK). [provider] Taking Active Self  rosuvastatin (CRESTOR) 20 MG tablet [id number] Yes Take 1 tablet (20 mg total) by mouth at bedtime. Dutch Quint B, FNP Taking Active   SUMAtriptan (IMITREX) 50 MG tablet [id number] Yes TAKE 1 TAB EVERY 2 HRS AS NEEDED FOR MIGRAINE. MAY REPEAT IN 2 HOURS IF HEADACHE PERSISTS OR RECURS. [name], MD Taking Active   torsemide [facility]) 20 MG tablet [id number] Yes Take 1 tablet (20 mg total) by mouth 2 (two) times daily. Kennyth Arnold, FNP Taking Active   TRUEplus Lancets 33G MISC [id number] Yes Use as directed Kennyth Arnold, FNP Taking Active             Patient Active Problem List   Diagnosis Date Noted   Hammer toe of second toe of left foot 05/05/2021   Corns and callosities 05/05/2021   Hav (hallux abducto valgus), unspecified laterality 05/05/2021   Hyperlipidemia associated with type 2 diabetes mellitus ([place]) 02/01/2021   Depression 02/01/2021   Localized osteoporosis without current pathological fracture 05/01/2020   Hyperparathyroidism ([place]) 12/13/2019   Elevated hemoglobin ([place]) 12/13/2019   Chronic renal impairment, stage 4 (severe) ([place]) 10/28/2019   Anemia 10/28/2019   Physical exam 06/26/2019   Neck pain 04/18/2019   Ascending aortic aneurysm 04/17/2019   Hypoxia    Nonspecific chest pain 04/15/2019   Neuropathic pain of right flank 12/27/2018   Dysphagia 12/27/2018   Postsurgical hypothyroidism 06/11/2018   Vitamin D deficiency 06/11/2018   Multinodular goiter 06/11/2018   Hypercalcemia 06/11/2018   DM type 2 causing  vascular disease (HCC)    PONV (postoperative nausea and vomiting)    Peripheral neuropathy    Migraine  Irritable bowel syndrome    Hypertension    History of right mastectomy    H/O hiatal hernia    GERD (gastroesophageal reflux disease)    Family history of anesthesia complication    Diabetes mellitus type 2, uncontrolled, with complications    Lower extremity edema    Chronic constipation 06/23/2015   Morbid obesity ([place]) 06/12/2015   Chronic diastolic heart failure ([place]) 11/17/2014   Herniated nucleus pulposus, L2-3 right 02/04/2014    Class: Acute   Spondylolisthesis of lumbar region 04/30/2012    Class: Chronic   Lumbar stenosis with neurogenic claudication 04/30/2012    Class: Chronic   SYNCOPE-CAROTID SINUS 09/16/2008   OSTEOARTHRITIS 08/23/2006   DDD (degenerative disc disease), cervical 08/23/2006   DISC DISEASE, LUMBAR 08/23/2006   Headache(784.0) 08/23/2006   BREAST CANCER, HX OF 08/23/2006   HYPERPARATHYROIDISM, HX OF 08/23/2006   RENAL CALCULUS, HX OF 08/23/2006    Immunization History  Administered Date(s) Administered   Fluad Quad(high Dose 65+) 12/13/2019, 02/01/2021   Influenza Split 01/31/2011   Influenza Whole 02/23/2007   Influenza, High Dose Seasonal PF 01/10/2017   Influenza,inj,Quad PF,6+ Mos 01/07/2014, 02/09/2016, 01/01/2018, 12/03/2018   Influenza-Unspecified 01/10/2012   PFIZER Comirnaty(Gray Top)Covid-19 Tri-Sucrose Vaccine 05/24/2019, 06/18/2019, 01/11/2020, 08/15/2020   PFIZER(Purple Top)SARS-COV-2 Vaccination 05/24/2019, 06/18/2019, 01/11/2020   Pneumococcal Conjugate-13 05/16/2019   Pneumococcal Polysaccharide-23 08/25/2014   Tdap 04/20/2011    Conditions to be addressed/monitored: Hypertension, Hyperlipidemia, Diabetes, GERD, COPD, Chronic Kidney Disease and Osteoporosis  Care Plan : General Pharmacy (Adult)  Updates made by Edythe Clarity, RPH since 06/10/2021 12:00 AM     Problem: Hypertension, Hyperlipidemia, Diabetes,  GERD, COPD, Chronic Kidney Disease and Osteoporosis   Priority: High  Onset Date: 06/10/2021     Long-Range Goal: Patient-Specific Goal   Start Date: 06/10/2021  Expected End Date: 12/11/2021  This Visit's Progress: On track  Priority: High  Note:   Pharmacist Clinical Goal(s):  Patient will achieve adherence to monitoring guidelines and medication adherence to achieve therapeutic efficacy contact provider office for questions/concerns as evidenced notation of same in electronic health record through collaboration with PharmD and provider.   Interventions: 1:1 collaboration with [name], MD regarding development and update of comprehensive plan of care as evidenced by provider attestation and co-signature Inter-disciplinary care team collaboration (see longitudinal plan of care) Comprehensive medication review performed; medication list updated in electronic medical record  Diabetes (A1c goal <7%) -Uncontrolled -Current medications: Novolin 70/30 40 units BID with a meal Appropriate, Effective Glipizide XL 2m dialy Appropriate, Query effective -Medications previously tried: GAnibal Henderson -Current home glucose readings fasting glucose: not checking glucose post prandial glucose: not checking glucose regularly -Denies hypoglycemic/hyperglycemic symptoms -Current exercise: minimal -Educated on A1c and blood sugar goals; Prevention and management of hypoglycemic episodes; Benefits of routine self-monitoring of blood sugar; -Counseled to check feet daily and get yearly eye exams -Recommended to continue current medication Stressed importance of medication adherence.  She does report to taking insulin but does admit to missing some doses.  Have asked her to check glucose for next week and record - will adjust based off of reported readings.  Hypertension (BP goal <130/80) -Controlled -At times not taking torsemide when leaving the house due to increased urination. As of  today is not using carvedilol or amlodipine  -Current treatment: Hydralazine 50 mg three times daily Appropriate, Query effective, Losartan 25 mg once daily Appropriate, Query effective, Torsemide 20 mg BID Appropriate, Query effective, Amlodipine 167mdaily Appropriate, Query  effective, Carvedilol 6.48m twice daily Appropriate, Query effective, -Meds needing clarifcation: amlodipine, carvedilol -Current home readings: at goal -Current exercise habits: no formal exercise - limited due to chronic pain - planing on getting spinal cord stimulator  -Denies hypotensive/hypertensive symptoms -Educated on Daily salt intake goal < 2300 mg; Importance of home blood pressure monitoring; -Reviewed importance of being consistent with torsemide regimen  -Counseled to monitor BP at home 1-2x/week, document, and provide log at future appointments -Recommended to continue current medication   Update 06/09/21 Patient reports she has not been taking any of her oral medication on regular basis.  She reports a BP of systolic 2836Oone time, unclear how accurate this was.  She denies any dizziness or HA.  We spent most of the visit talking about importance of adherence.  Patient agrees to start taking her medications more regularly and agrees to me having someone check in with her regularly to see if she is taking her medications. She gets mail order which is preferred so [facility] services were not an option. We cannot adjust BP meds if she is not taking what she is prescribed. Will have CMA call patient next week to ensure she is back on track with medications.  Osteoporosis (goal prevent fractures) -Controlled -Reports to be using consistently without issue  -Current regimen: Alendronate 70 mg once weekly -Reviewed side effects - no problems noted  -Last DEXA 03/2020 - osteoporosis  Hyperlipidemia: (LDL goal < 70) -Controlled based off of 05/2020 labs -Several cardiac risk factors  -Current  treatment: Rosuvastatin 20 mg once daily (90 DS sent 4/22) -Reviewed adherence with patient - reports consistent use. Denies any recent side effects of problems -Educated on Cholesterol goals;  Benefits of statin for ASCVD risk reduction; -Recommended to continue current medication  -Per Dr CStanford Breedpt will need f/u imagining march 2023 on thoracic aortic aneursym   GERD (Goal: minimize symptoms, optimize medication safety) -Controlled -Daily ASA -Some ongoing symptoms but feels controlled well overall. -Several questions about metoclopramide and pantoprazole - reviewed at length with patient, all questions answered. -Current treatment: Pantoprazole 40 mg twice daily - 1 tablet twice daily  Metoclopramide 10 mg four times daily  -Dietary triggers reviewed at length  -No problems with current regimen per patient. No side effects noted -Recommended continue current medications   Patient Goals/Self-Care Activities Patient will:  - target a minimum of 150 minutes of moderate intensity exercise weekly engage in dietary modifications by minimizing snacking   Follow Up Plan: Telephone follow up appointment with care management team member scheduled for: Medication Assistance: None required.  Patient affirms current coverage meets needs.         Patient's preferred pharmacy is:  [id number] [id number] [id number]: [phone]: [phone] [facility] - [id number]: [phone]: [phone] Up:  Patient agrees to Care Plan and Follow-up.  Plan: Regular follow up to promote adherence [facility]  Date Time Provider [place]/15/2023  2:20 PM [facility] 1 [facility]  06/23/2021  3:15 PM [facility] [place] PA [facility]   08/04/2021 11:15 AM MaGardiner BarefootDPM [facility] [facility]   ChBeverly MilchPharmD Clinical Pharmacist  [phone]

## 2021-06-10 NOTE — Patient Instructions (Addendum)
Visit Information ? ? Goals Addressed   ? ?  ?  ?  ?  ? This Visit's Progress  ?  A1C <7.5%   Not on track  ?  CARE PLAN ENTRY ?Current Barriers:  ?Diabetes: type 2; complicated by chronic medical conditions including high blood pressure, chronic kidney disease. ?Lab Results  ?Component Value Date  ? HGBA1C 8.4 (H) 08/05/2019  ? ?Lab Results  ?Component Value Date  ? CREATININE 1.55 (H) 08/05/2019  ? CREATININE 2.07 (H) 04/20/2019  ? CREATININE 2.13 (H) 04/19/2019  ?Current diabetes regimen: ?Glipizide XL 5 mg once daily   ?Insulin regular 70-3070 units with breakfast and 60 units with supper for BG above 90 ?Denies hypoglycemic symptoms, including dizziness, lightheadedness, shaking, sweating ? ?Pharmacist Clinical Goal(s):  ?Over the next 180 days, patient will work with PharmD and primary care provider to address diabetes. ?A1c goal <7.5%2  ?Interventions: ?Comprehensive medication review performed, medication list updated in electronic medical record ?Inter-disciplinary care team collaboration ? ?Patient Self Care Activities:  ?Patient will check blood glucose daily , document, and provide at future appointments ?Patient will take medications as prescribed ?Patient will contact provider with any episodes of hypoglycemia ?Patient will report any questions or concerns to provider  ?Initial goal documentation. ?  ? ?  ? ? ? ? ? ?Patient Care Plan: General Pharmacy (Adult)  ?  ? ?Problem Identified: Hypertension, Hyperlipidemia, Diabetes, GERD, COPD, Chronic Kidney Disease and Osteoporosis   ?Priority: High  ?Onset Date: 06/10/2021  ?  ? ?Long-Range Goal: Patient-Specific Goal   ?Start Date: 06/10/2021  ?Expected End Date: 12/11/2021  ?This Visit's Progress: On track  ?Priority: High  ?Note:   ?Pharmacist Clinical Goal(s):  ?Patient will achieve adherence to monitoring guidelines and medication adherence to achieve therapeutic efficacy ?contact provider office for questions/concerns as evidenced notation of same in  electronic health record through collaboration with PharmD and provider.  ? ?Interventions: ?1:1 collaboration with [name], MD regarding development and update of comprehensive plan of care as evidenced by provider attestation and co-signature ?Inter-disciplinary care team collaboration (see longitudinal plan of care) ?Comprehensive medication review performed; medication list updated in electronic medical record ? ?Diabetes (A1c goal <7%) ?-Uncontrolled ?-Current medications: ?Novolin 70/30 40 units BID with a meal Appropriate, Effective ?Glipizide XL 5mg  dialy Appropriate, Query effective ?-Medications previously tried: Anibal Henderson  ?-Current home glucose readings ?fasting glucose: not checking glucose ?post prandial glucose: not checking glucose regularly ?-Denies hypoglycemic/hyperglycemic symptoms ?-Current exercise: minimal ?-Educated on A1c and blood sugar goals; ?Prevention and management of hypoglycemic episodes; ?Benefits of routine self-monitoring of blood sugar; ?-Counseled to check feet daily and get yearly eye exams ?-Recommended to continue current medication ?Stressed importance of medication adherence.  She does report to taking insulin but does admit to missing some doses.  Have asked her to check glucose for next week and record - will adjust based off of reported readings. ? ?Hypertension (BP goal <130/80) ?-Controlled ?-At times not taking torsemide when leaving the house due to increased urination. As of today is not using carvedilol or amlodipine  ?-Current treatment: ?Hydralazine 50 mg three times daily Appropriate, Query effective, ?Losartan 25 mg once daily Appropriate, Query effective, ?Torsemide 20 mg BID Appropriate, Query effective, ?Amlodipine 10mg  daily Appropriate, Query effective, ?Carvedilol 6.26mg  twice daily Appropriate, Query effective, ?-Meds needing clarifcation: amlodipine, carvedilol ?-Current home readings: at goal ?-Current exercise habits: no formal  exercise - limited due to chronic pain - planing on getting spinal cord stimulator  ?-Denies  hypotensive/hypertensive symptoms ?-Educated on Daily salt intake goal < 2300 mg; ?Importance of home blood pressure monitoring; ?-Reviewed importance of being consistent with torsemide regimen  ?-Counseled to monitor BP at home 1-2x/week, document, and provide log at future appointments ?-Recommended to continue current medication  ? ?Update 06/09/21 ?Patient reports she has not been taking any of her oral medication on regular basis.  She reports a BP of systolic 706C one time, unclear how accurate this was.  She denies any dizziness or HA.  We spent most of the visit talking about importance of adherence.  Patient agrees to start taking her medications more regularly and agrees to me having someone check in with her regularly to see if she is taking her medications. ?She gets mail order which is preferred so [facility] services were not an option. ?We cannot adjust BP meds if she is not taking what she is prescribed. ?Will have CMA call patient next week to ensure she is back on track with medications. ? ?Osteoporosis (goal prevent fractures) ?-Controlled ?-Reports to be using consistently without issue  ?-Current regimen: ?Alendronate 70 mg once weekly ?-Reviewed side effects - no problems noted  ?-Last DEXA 03/2020 - osteoporosis ? ?Hyperlipidemia: (LDL goal < 70) ?-Controlled based off of 05/2020 labs ?-Several cardiac risk factors  ?-Current treatment: ?Rosuvastatin 20 mg once daily (90 DS sent 4/22) ?-Reviewed adherence with patient - reports consistent use. Denies any recent side effects of problems ?-Educated on Cholesterol goals;  ?Benefits of statin for ASCVD risk reduction; ?-Recommended to continue current medication  ?-Per Dr Stanford Breed pt will need f/u imagining march 2023 on thoracic aortic aneursym  ? ?GERD (Goal: minimize symptoms, optimize medication safety) ?-Controlled ?-Daily ASA ?-Some ongoing symptoms but  feels controlled well overall. -Several questions about metoclopramide and pantoprazole - reviewed at length with patient, all questions answered. ?-Current treatment: ?Pantoprazole 40 mg twice daily - 1 tablet twice daily  ?Metoclopramide 10 mg four times daily  ?-Dietary triggers reviewed at length  ?-No problems with current regimen per patient. No side effects noted ?-Recommended continue current medications ? ? ?Patient Goals/Self-Care Activities ?Patient will:  ?- target a minimum of 150 minutes of moderate intensity exercise weekly ?engage in dietary modifications by minimizing snacking  ? ?Follow Up Plan: Telephone follow up appointment with care management team member scheduled for: ?Medication Assistance: None required.  Patient affirms current coverage meets needs. ?  ? ?  ? ?  ? ?The patient verbalized understanding of instructions, educational materials, and care plan provided today and declined offer to receive copy of patient instructions, educational materials, and care plan.  ?Telephone follow up appointment with pharmacy team member scheduled for: 3 months ? ?Edythe Clarity, [facility]  ?Beverly Milch, PharmD ?Clinical Pharmacist  ?[facility] ?[phone] ? ?

## 2021-06-18 ENCOUNTER — Telehealth: Payer: Self-pay | Admitting: Pharmacist

## 2021-06-18 NOTE — Progress Notes (Signed)
? ? ?  Chronic Care Management ?Pharmacy Assistant  ? ?Name: [name]  MRN: [id number] DOB: [birth year] ? ? ?Reason for Encounter: Follow up call with patient post [name] visit on Medication Adherence ? ? ?Called patient to follow up on medication adherence post visit with [name] Leata Mouse. Left detailed message for patient to return my call to discuss in detail. Will await patient's return call.  ? ? ?Liza Showfety, CCMA ?Clinical Pharmacist Assistant  ?([phone] ? ?

## 2021-06-20 ENCOUNTER — Other Ambulatory Visit: Payer: Self-pay

## 2021-06-20 ENCOUNTER — Emergency Department (HOSPITAL_BASED_OUTPATIENT_CLINIC_OR_DEPARTMENT_OTHER)
Admission: EM | Admit: 2021-06-20 | Discharge: 2021-06-21 | Disposition: A | Discharge status: Home / Self Care | Payer: Medicare HMO | Attending: Emergency Medicine | Admitting: Emergency Medicine

## 2021-06-20 ENCOUNTER — Encounter (HOSPITAL_BASED_OUTPATIENT_CLINIC_OR_DEPARTMENT_OTHER): Payer: Self-pay | Admitting: *Deleted

## 2021-06-20 DIAGNOSIS — Z91040 Latex allergy status: Secondary | ICD-10-CM | POA: Insufficient documentation

## 2021-06-20 DIAGNOSIS — Z853 Personal history of malignant neoplasm of breast: Secondary | ICD-10-CM | POA: Diagnosis not present

## 2021-06-20 DIAGNOSIS — E039 Hypothyroidism, unspecified: Secondary | ICD-10-CM | POA: Diagnosis not present

## 2021-06-20 DIAGNOSIS — N183 Chronic kidney disease, stage 3 unspecified: Secondary | ICD-10-CM | POA: Diagnosis not present

## 2021-06-20 DIAGNOSIS — Z7982 Long term (current) use of aspirin: Secondary | ICD-10-CM | POA: Diagnosis not present

## 2021-06-20 DIAGNOSIS — E1165 Type 2 diabetes mellitus with hyperglycemia: Secondary | ICD-10-CM | POA: Insufficient documentation

## 2021-06-20 DIAGNOSIS — Z794 Long term (current) use of insulin: Secondary | ICD-10-CM | POA: Diagnosis not present

## 2021-06-20 DIAGNOSIS — Z79899 Other long term (current) drug therapy: Secondary | ICD-10-CM | POA: Insufficient documentation

## 2021-06-20 DIAGNOSIS — Z87442 Personal history of urinary calculi: Secondary | ICD-10-CM | POA: Insufficient documentation

## 2021-06-20 DIAGNOSIS — N189 Chronic kidney disease, unspecified: Secondary | ICD-10-CM

## 2021-06-20 DIAGNOSIS — R7989 Other specified abnormal findings of blood chemistry: Secondary | ICD-10-CM | POA: Insufficient documentation

## 2021-06-20 DIAGNOSIS — E860 Dehydration: Secondary | ICD-10-CM | POA: Diagnosis not present

## 2021-06-20 DIAGNOSIS — Z20822 Contact with and (suspected) exposure to covid-19: Secondary | ICD-10-CM | POA: Diagnosis not present

## 2021-06-20 DIAGNOSIS — I129 Hypertensive chronic kidney disease with stage 1 through stage 4 chronic kidney disease, or unspecified chronic kidney disease: Secondary | ICD-10-CM | POA: Insufficient documentation

## 2021-06-20 DIAGNOSIS — R251 Tremor, unspecified: Secondary | ICD-10-CM | POA: Diagnosis not present

## 2021-06-20 DIAGNOSIS — R739 Hyperglycemia, unspecified: Secondary | ICD-10-CM

## 2021-06-20 LAB — URINALYSIS, ROUTINE W REFLEX MICROSCOPIC
Bilirubin Urine: NEGATIVE
Glucose, UA: 500 mg/dL — AB
Hgb urine dipstick: NEGATIVE
Ketones, ur: NEGATIVE mg/dL
Leukocytes,Ua: NEGATIVE
Nitrite: NEGATIVE
Protein, ur: 100 mg/dL — AB
Specific Gravity, Urine: 1.01 (ref 1.005–1.030)
pH: 6 (ref 5.0–8.0)

## 2021-06-20 LAB — CBG MONITORING, ED: Glucose-Capillary: 266 mg/dL — ABNORMAL HIGH (ref 70–99)

## 2021-06-20 MED ORDER — SODIUM CHLORIDE 0.9 % IV BOLUS
1000.0000 mL | Freq: Once | INTRAVENOUS | Status: AC
Start: 2021-06-20 — End: 2021-06-21
  Administered 2021-06-21: 1000 mL via INTRAVENOUS

## 2021-06-20 NOTE — ED Triage Notes (Signed)
Pt reports her glucose was 341, she took her insulin. Took her BP because she did not feel good and it was 80/30. Headache, dizziness, feels shaky. Denies n/v/ or change in vision.  ?

## 2021-06-20 NOTE — ED Provider Notes (Signed)
[name] EMERGENCY DEPT Provider Note   CSN: [id number] Arrival date & time: 06/20/21  2221     History  Chief Complaint  Patient presents with   Hyperglycemia    [name] is a 71 y.o. female.  HPI     This is a 71 year old female with a history of cancer and diabetes who presents with hyperglycemia.  Patient reports that she checked her blood sugar today and it was 341.  She states she is normally between 102 100.  She took an extra 10 units of insulin.  She subsequently took her blood pressure and it was 80/30.  She states that generally she has not felt well today.  Denies any focal symptoms or specific infectious symptoms.  Denies chest pain, shortness of breath, abdominal pain, nausea, vomiting.  She does state that she feels kind of dizzy and shaking.  She is not had any fevers at home but has felt chilled.  No known sick contacts or COVID exposures.  Home Medications Prior to Admission medications   Medication Sig Start Date End Date Taking? Authorizing Provider  acetaminophen (TYLENOL) 500 MG tablet Take 500-1,000 mg by mouth every 6 (six) hours as needed for moderate pain, headache or mild pain.    [provider]  Alcohol Swabs (B-D SINGLE USE SWABS REGULAR) PADS Use as directed. 12/02/20   Kennyth Arnold, FNP  amLODipine (NORVASC) 10 MG tablet Take 1 tablet (10 mg total) by mouth daily. 12/02/20   Dutch Quint B, FNP  aspirin 81 MG tablet Take 81 mg by mouth daily.    [provider]  Blood Glucose Monitoring Suppl (TRUE METRIX AIR GLUCOSE METER) w/Device KIT 1 kit by Does not apply route 3 (three) times daily. 06/14/19   [name], MD  carvedilol (COREG) 6.25 MG tablet Take 1 tablet (6.25 mg total) by mouth 2 (two) times daily with a meal. 12/02/20   Dutch Quint B, FNP  famotidine (PEPCID) 40 MG tablet TAKE 1 TABLET BY MOUTH EVERY DAY 06/03/21   [name], MD  ferrous gluconate (FERGON) 324 MG tablet TAKE 1  TABLET (324 MG TOTAL) BY MOUTH 2 (TWO) TIMES DAILY WITH A MEAL. 10/15/19   Noemi Chapel P, DO  glipiZIDE (GLUCOTROL XL) 5 MG 24 hr tablet TAKE 1 TABLET BY MOUTH EVERY DAY WITH BREAKFAST 12/02/20   Dutch Quint B, FNP  glucose blood (TRUE METRIX BLOOD GLUCOSE TEST) test strip Use as instructed 12/02/20   Dutch Quint B, FNP  hydrALAZINE (APRESOLINE) 50 MG tablet Take 1 tablet (50 mg total) by mouth 3 (three) times daily. 12/02/20   Kennyth Arnold, FNP  insulin NPH-regular Human (NOVOLIN 70/30 RELION) (70-30) 100 UNIT/ML injection Inject 45 Units into the skin 2 (two) times daily with a meal. 12/16/20   Dutch Quint B, FNP  levothyroxine (SYNTHROID) 100 MCG tablet Take 1 tablet (100 mcg total) by mouth daily. 12/02/20   Kennyth Arnold, FNP  losartan (COZAAR) 25 MG tablet Take 1 tablet (25 mg total) by mouth daily. 12/02/20   Dutch Quint B, FNP  metoCLOPramide (REGLAN) 10 MG tablet TAKE 1 TABLET FOUR TIMES DAILY 02/07/21   Dutch Quint B, FNP  ondansetron (ZOFRAN) 4 MG tablet Take 1 tablet (4 mg total) by mouth every 8 (eight) hours as needed for nausea or vomiting. 04/01/21   [name], MD  pantoprazole (PROTONIX) 40 MG tablet Take 1 tablet (40 mg total) by mouth 2 (two) times daily.  12/02/20   Dutch Quint B, FNP  polyethylene glycol (MIRALAX / GLYCOLAX) packet Take 17 g by mouth daily as needed for moderate constipation (MIX AND DRINK).    [provider]  rosuvastatin (CRESTOR) 20 MG tablet Take 1 tablet (20 mg total) by mouth at bedtime. 12/02/20   Dutch Quint B, FNP  SUMAtriptan (IMITREX) 50 MG tablet TAKE 1 TAB EVERY 2 HRS AS NEEDED FOR MIGRAINE. MAY REPEAT IN 2 HOURS IF HEADACHE PERSISTS OR RECURS. 04/28/21   [name], MD  torsemide (DEMADEX) 20 MG tablet Take 1 tablet (20 mg total) by mouth 2 (two) times daily. 12/02/20   Dutch Quint B, FNP  TRUEplus Lancets 33G MISC Use as directed 12/02/20   Kennyth Arnold, FNP      Allergies    Dilaudid [hydromorphone hcl],  Hydromorphone, Lipitor [atorvastatin], Adhesive [tape], Ampicillin, Latex, and Penicillins    Review of Systems   Review of Systems  Constitutional:  Positive for chills. Negative for fever.  Respiratory:  Negative for shortness of breath.   Cardiovascular:  Negative for chest pain.  Gastrointestinal:  Negative for abdominal pain.  Genitourinary:  Negative for urgency.  All other systems reviewed and are negative.  Physical Exam Updated Vital Signs BP (!) 151/65    Pulse 62    Temp 98.9 F (37.2 C) (Rectal)    Resp 18    SpO2 96%  Physical Exam Vitals and nursing note reviewed.  Constitutional:      Appearance: She is well-developed. She is obese. She is not ill-appearing.  HENT:     Head: Normocephalic and atraumatic.     Nose: Nose normal.     Mouth/Throat:     Mouth: Mucous membranes are moist.  Eyes:     Pupils: Pupils are equal, round, and reactive to light.  Cardiovascular:     Rate and Rhythm: Normal rate and regular rhythm.     Heart sounds: Normal heart sounds.  Pulmonary:     Effort: Pulmonary effort is normal. No respiratory distress.     Breath sounds: No wheezing.  Abdominal:     Palpations: Abdomen is soft.     Tenderness: There is no abdominal tenderness.  Musculoskeletal:        General: No swelling.     Cervical back: Neck supple.  Skin:    General: Skin is warm and dry.  Neurological:     Mental Status: She is alert and oriented to person, place, and time.     Comments: Tremulous and shaking, otherwise nonfocal  Psychiatric:        Mood and Affect: Mood normal.    ED Results / Procedures / Treatments   Labs (all labs ordered are listed, but only abnormal results are displayed) Labs Reviewed  BASIC METABOLIC PANEL - Abnormal; Notable for the following components:      Result Value   Glucose, Bld 217 (*)    BUN 49 (*)    Creatinine, Ser 2.37 (*)    Calcium 10.4 (*)    GFR, Estimated 21 (*)    All other components within normal limits  CBC -  Abnormal; Notable for the following components:   Hemoglobin 11.2 (*)    All other components within normal limits  URINALYSIS, ROUTINE W REFLEX MICROSCOPIC - Abnormal; Notable for the following components:   Color, Urine COLORLESS (*)    Glucose, UA 500 (*)    Protein, ur 100 (*)    Bacteria, UA RARE (*)  All other components within normal limits  CBG MONITORING, ED - Abnormal; Notable for the following components:   Glucose-Capillary 266 (*)    All other components within normal limits  CBG MONITORING, ED - Abnormal; Notable for the following components:   Glucose-Capillary 144 (*)    All other components within normal limits  RESP PANEL BY RT-PCR (FLU A&B, COVID) ARPGX2    EKG None  Radiology No results found.  Procedures Procedures    Medications Ordered in ED Medications  sodium chloride 0.9 % bolus 1,000 mL (0 mLs Intravenous Stopped 06/21/21 0313)    ED Course/ Medical Decision Making/ A&P                           Medical Decision Making Amount and/or Complexity of Data Reviewed Labs: ordered.   This patient presents to the ED for concern of hyperglycemia, this involves an extensive number of treatment options, and is a complaint that carries with it a high risk of complications and morbidity.  The differential diagnosis includes DKA, hyperglycemia, hyperglycemia provoked by infection  MDM:    This is a 71 year old female who presents with concerns for hypoglycemia.  Reports that her blood pressure was also low at home.  She had dizziness and shakiness at that time.  No fevers.  She is nontoxic.  Initial vital signs here are largely reassuring.  98.7, blood pressure 125/100.  She currently is only complaining of feeling shaky.  Blood sugar 266.  Given concerns for out of range blood sugars at home and hypotension, will screen for infectious process and DKA.  Labs obtained.  Creatinine has crept up to 2.37.  Previously she was in the twos but most recently was  1.43.  Patient was given a liter of fluids.  She has no evidence of DKA.  No evidence of urinary tract infection.  COVID and influenza testing are negative.  She denies upper respiratory symptoms.  Do not feel she needs x-ray imaging at this time given reassuring pulmonary exam and lack of symptoms.  EKG shows no evidence of acute ischemia or arrhythmia.  On recheck, patient states she feels much better after fluids.  Will discharge home with recheck with her primary physician regarding her creatinine.  Recommend fluids at home and monitoring her blood sugar closely.   (Labs, imaging)  Labs: I Ordered, and personally interpreted labs.  The pertinent results include: CBC with hyperglycemia and creatinine of 2.37.  Normal CBC, negative urinalysis, negative COVID and influenza  Imaging Studies ordered: I ordered imaging studies including none I independently visualized and interpreted imaging. I agree with the radiologist interpretation  Additional history obtained from husband.  External records from outside source obtained and reviewed including prior visit  Critical Interventions: IV fluids  Consultations: I requested consultation with the NA,  and discussed lab and imaging findings as well as pertinent plan - they recommend: N/A  Cardiac Monitoring: The patient was maintained on a cardiac monitor.  I personally viewed and interpreted the cardiac monitored which showed an underlying rhythm of: Normal sinus rhythm  Reevaluation: After the interventions noted above, I reevaluated the patient and found that they have :improved   Considered admission for: Dehydration  Social Determinants of Health: Lives independently with husband  Disposition: Discharge  Co morbidities that complicate the patient evaluation  Past Medical History:  Diagnosis Date   Anginal pain ([place]) 03/19/2012   saw Dr. Cathie Olden .. she thinks its more  related to stomach issues   Arthritis    "all over; mostly in my  back" (03/20/2012)   Breast cancer ([place])    "right" (03/20/2012)   Cardiomyopathy    Chest pain 06/2005   Hospitalized, dystolic dysfunction   Chronic lower back pain    "have 2 herniated discs; going to have to have a fusion" (03/20/2012)   CKD (chronic kidney disease), stage III ([place])    Family history of anesthesia complication    "father has nausea and vomiting"   Gallstones    GERD (gastroesophageal reflux disease)    H/O hiatal hernia    History of kidney stones    History of right mastectomy    Hypertension    "patient states never had HTN, takes Hyzaar for heart   Hypothyroidism    Irritable bowel syndrome    Migraines    "it's been a long time since I've had one" (03/20/2012)   Osteoarthritis    Peripheral neuropathy    PONV (postoperative nausea and vomiting)    Sciatica    Type II diabetes mellitus ([place])      Medicines Meds ordered this encounter  Medications   sodium chloride 0.9 % bolus 1,000 mL    I have reviewed the patients home medicines and have made adjustments as needed  Problem List / ED Course: Problem List Items Addressed This Visit   None Visit Diagnoses     Hyperglycemia    -  Primary   Dehydration       Chronic kidney disease, unspecified CKD stage                       Final Clinical Impression(s) / ED Diagnoses Final diagnoses:  Hyperglycemia  Dehydration  Chronic kidney disease, unspecified CKD stage    Rx / DC Orders ED Discharge Orders     None         Ahmiyah Coil, Barbette Hair, MD 06/21/21 0500

## 2021-06-21 LAB — CBC
HCT: 36.1 % (ref 36.0–46.0)
Hemoglobin: 11.2 g/dL — ABNORMAL LOW (ref 12.0–15.0)
MCH: 28.4 pg (ref 26.0–34.0)
MCHC: 31 g/dL (ref 30.0–36.0)
MCV: 91.4 fL (ref 80.0–100.0)
Platelets: 328 10*3/uL (ref 150–400)
RBC: 3.95 MIL/uL (ref 3.87–5.11)
RDW: 14.2 % (ref 11.5–15.5)
WBC: 8.8 10*3/uL (ref 4.0–10.5)
nRBC: 0 % (ref 0.0–0.2)

## 2021-06-21 LAB — CBG MONITORING, ED: Glucose-Capillary: 144 mg/dL — ABNORMAL HIGH (ref 70–99)

## 2021-06-21 LAB — BASIC METABOLIC PANEL
Anion gap: 10 (ref 5–15)
BUN: 49 mg/dL — ABNORMAL HIGH (ref 8–23)
CO2: 23 mmol/L (ref 22–32)
Calcium: 10.4 mg/dL — ABNORMAL HIGH (ref 8.9–10.3)
Chloride: 107 mmol/L (ref 98–111)
Creatinine, Ser: 2.37 mg/dL — ABNORMAL HIGH (ref 0.44–1.00)
GFR, Estimated: 21 mL/min — ABNORMAL LOW (ref >60)
Glucose, Bld: 217 mg/dL — ABNORMAL HIGH (ref 70–99)
Potassium: 4.3 mmol/L (ref 3.5–5.1)
Sodium: 140 mmol/L (ref 135–145)

## 2021-06-21 LAB — RESP PANEL BY RT-PCR (FLU A&B, COVID) ARPGX2
Influenza A by PCR: NEGATIVE
Influenza B by PCR: NEGATIVE
SARS Coronavirus 2 by RT PCR: NEGATIVE

## 2021-06-21 NOTE — Discharge Instructions (Signed)
You were seen today for high blood sugars.  It was noted in your lab work that your creatinine has creeped back up.  This could be reflective of dehydration.  You have no evidence of infection at this time.  Monitor your blood sugars closely at home.  Make sure that you are drinking plenty of fluids.  Follow-up closely with your primary physician for recheck. ?

## 2021-06-23 ENCOUNTER — Ambulatory Visit
Admission: RE | Admit: 2021-06-23 | Discharge: 2021-06-23 | Disposition: A | Discharge status: Home / Self Care | Payer: Medicare HMO | Source: Ambulatory Visit | Attending: Thoracic Surgery (Cardiothoracic Vascular Surgery) | Admitting: Thoracic Surgery (Cardiothoracic Vascular Surgery)

## 2021-06-23 ENCOUNTER — Ambulatory Visit: Payer: Medicare HMO | Admitting: Physician Assistant

## 2021-06-23 ENCOUNTER — Other Ambulatory Visit: Payer: Self-pay

## 2021-06-23 VITALS — BP 115/53 | HR 60 | Resp 20 | Ht 64.0 in | Wt 197.0 lb

## 2021-06-23 DIAGNOSIS — I7121 Aneurysm of the ascending aorta, without rupture: Secondary | ICD-10-CM

## 2021-06-23 DIAGNOSIS — R079 Chest pain, unspecified: Secondary | ICD-10-CM | POA: Diagnosis not present

## 2021-06-23 NOTE — Patient Instructions (Addendum)
CONTACT DR. FOSTER FOR FOLLOW UP OF HYPOTENSION AND ELEVATED CREATININE ? ?AVOID FLOUROQUINOLONES AS THIS INCREASES YOUR RISK OF DISSECTION ? ?Patient is counseled regarding the importance of long term risk factor modification as they pertain to the presence of ischemic heart disease including avoiding the use of all tobacco products, dietary modifications and medical therapy for diabetes, cholesterol and lipid management, and regular exercise.   ? ?

## 2021-06-23 NOTE — Progress Notes (Signed)
? ?   ?[address] ?      [place] [address] ?            [phone]   ? ?  ? ? ?[name] ?[id number] ?1950-12-09 ? ?History of Present Illness: ? ?[name] is a 71 yo female with history of HTN, HLD, DM, GERD, CKD, and Ascending aortic aneurysm.  She presents today for 1 year follow up.  Overall the patient states she hasn't been feeling well.  She went to the ED on 3/12 with complaints of feeling dizzy and shaky and low blood pressure.  She denied chest pain, shortness of breath, N/V at that time.  In the ED labs showed an elevated blood glucose level of 341, creatinine was elevated at 3.47, and her SBP was in the 80s.  She was treated with IV hydration and ultimately discharged home.  Today the patient states she has been feeling bad for a while.  She is tired with no energy.  She has experienced a bad taste in her mouth with occasional vomiting.  She has some episodic chest pain and occasional persistent cough as well.  She states that she has trouble even with water trying to get it down without coughing which she is unable to do.  She has been evaluated by Cardiology who did not feel her pain was cardiac in nature.  She states her blood pressure has been running in the 80s recently.  She is not a smoker. ? ?Current Outpatient Medications on File Prior to Visit  ?Medication Sig Dispense Refill  ? acetaminophen (TYLENOL) 500 MG tablet Take 500-1,000 mg by mouth every 6 (six) hours as needed for moderate pain, headache or mild pain.    ? Alcohol Swabs (B-D SINGLE USE SWABS REGULAR) PADS Use as directed. 100 each 1  ? amLODipine (NORVASC) 10 MG tablet Take 1 tablet (10 mg total) by mouth daily. 90 tablet 3  ? aspirin 81 MG tablet Take 81 mg by mouth daily.    ? Blood Glucose Monitoring Suppl (TRUE METRIX AIR GLUCOSE METER) w/Device KIT 1 kit by Does not apply route 3 (three) times daily. 1 kit 0  ? carvedilol (COREG) 6.25 MG tablet Take 1 tablet (6.25 mg total) by mouth 2 (two)  times daily with a meal. 180 tablet 1  ? famotidine (PEPCID) 40 MG tablet TAKE 1 TABLET BY MOUTH EVERY DAY 90 tablet 1  ? ferrous gluconate (FERGON) 324 MG tablet TAKE 1 TABLET (324 MG TOTAL) BY MOUTH 2 (TWO) TIMES DAILY WITH A MEAL. 180 tablet 2  ? glipiZIDE (GLUCOTROL XL) 5 MG 24 hr tablet TAKE 1 TABLET BY MOUTH EVERY DAY WITH BREAKFAST 90 tablet 0  ? glucose blood (TRUE METRIX BLOOD GLUCOSE TEST) test strip Use as instructed 100 each 12  ? hydrALAZINE (APRESOLINE) 50 MG tablet Take 1 tablet (50 mg total) by mouth 3 (three) times daily. 90 tablet 1  ? insulin NPH-regular Human (NOVOLIN 70/30 RELION) (70-30) 100 UNIT/ML injection Inject 45 Units into the skin 2 (two) times daily with a meal. 10 mL 1  ? levothyroxine (SYNTHROID) 100 MCG tablet Take 1 tablet (100 mcg total) by mouth daily. 90 tablet 1  ? losartan (COZAAR) 25 MG tablet Take 1 tablet (25 mg total) by mouth daily. 90 tablet 1  ? metoCLOPramide (REGLAN) 10 MG tablet TAKE 1 TABLET FOUR TIMES DAILY 240 tablet 0  ? ondansetron (ZOFRAN) 4 MG tablet Take 1 tablet (4 mg total)  by mouth every 8 (eight) hours as needed for nausea or vomiting. 30 tablet 1  ? pantoprazole (PROTONIX) 40 MG tablet Take 1 tablet (40 mg total) by mouth 2 (two) times daily. 180 tablet 1  ? polyethylene glycol (MIRALAX / GLYCOLAX) packet Take 17 g by mouth daily as needed for moderate constipation (MIX AND DRINK).    ? rosuvastatin (CRESTOR) 20 MG tablet Take 1 tablet (20 mg total) by mouth at bedtime. 90 tablet 1  ? SUMAtriptan (IMITREX) 50 MG tablet TAKE 1 TAB EVERY 2 HRS AS NEEDED FOR MIGRAINE. MAY REPEAT IN 2 HOURS IF HEADACHE PERSISTS OR RECURS. 9 tablet 1  ? torsemide (DEMADEX) 20 MG tablet Take 1 tablet (20 mg total) by mouth 2 (two) times daily. 180 tablet 1  ? TRUEplus Lancets 33G MISC Use as directed 300 each 3  ? ?No current facility-administered medications on file prior to visit.  ? ? ? ?BP (!) 115/53   Pulse 60   Resp 20   Ht '5\' 4"'  (1.626 m)   Wt 197 lb (89.4 kg)    SpO2 95% Comment: RA  BMI 33.81 kg/m?  ? ?Physical Exam ? ?Gen: no apparent distress ?Heart: RRR ?Lungs: CTA bilaterally ?Neck: no bruit ?Ext: no edema ? ?CTA Results: ? ?EXAM: ?CT CHEST WITHOUT CONTRAST ?  ?TECHNIQUE: ?Multidetector CT imaging of the chest was performed following the ?standard protocol without IV contrast. ?  ?RADIATION DOSE REDUCTION: This exam was performed according to the ?departmental dose-optimization program which includes automated ?exposure control, adjustment of the mA and/or kV according to ?patient size and/or use of iterative reconstruction technique. ?  ?COMPARISON:  07/06/2020 ?  ?FINDINGS: ?Cardiovascular: Heart size is normal. Tiny amount of pericardial ?fluid. Small amount of calcification of the mitral valve annulus. No ?coronary artery calcification is seen. Maximal diameter of the ?ascending aorta remains 4.1 cm, unchanged since the previous study. ?Mild atherosclerotic calcification affects the thoracic aorta at the ?arch and descending aorta. ?  ?Mediastinum/Nodes: No mass or lymphadenopathy. ?  ?Lungs/Pleura: No pleural effusion. No mass or nodule. Newly seen ?mild patchy density within the inferior left lower lobe, not present ?1 year ago. This could represent mild left lower lobe pneumonia or ?could be scarring from a previous interval pneumonia. Are there ?current symptoms? ?  ?Upper Abdomen: Previous cholecystectomy. No acute or significant ?finding. ?  ?Musculoskeletal: Ordinary thoracic degenerative changes. Spinal cord ?stimulator as seen previously. ?  ?IMPRESSION: ?No change in the maximal diameter of the ascending aorta, 4.1 cm. ?Recommend annual imaging followup by CTA or MRA. This recommendation ?follows 2010 ACCF/AHA/AATS/ACR/ASA/SCA/SCAI/SIR/STS/SVM Guidelines ?for the Diagnosis and Management of Patients with Thoracic Aortic ?Disease. Circulation. 2010; 121: F749-S496. Aortic aneurysm NOS ?(ICD10-I71.9) ?  ?Aortic atherosclerotic calcification. ?  ?Newly seen  patchy density within the inferior left lower lobe which ?could represent mild left lower lobe pneumonia or residual scarring ?from previous interval pneumonia. Is the patient currently ?symptomatic? ?  ?  ?Electronically Signed ?  By: Nelson Chimes M.D. ?  On: 06/23/2021 14:53 ? ? ?A/P: ? ?Ascending Aortic Aneurysm stable at 4.1 cm- will need repeat CT Chest in 1 year ?Hypotension- patient is on 4 antihypertensive agents, including Carvedilol which could be a source of her decrease in energy level, today her HR is only 60... she was instructed to contact her nephrologist for an appointment for evaluation... Her creatinine was elevated in the ED on 3/12 and now she is hypotension I suspect she may need to adjust her  medication regimen ?GERD- sour taste in mouth, patient also describes difficulty with oral intake, could be aspirating with ingestion... GI evaluation may be helpful if issues persist ?ID- evidence of ? Pneumonia on CT scan.. patient is afebrile, no currently with cough, no leukocytosis on CBC in ED.. I suspect this is old and with dysphagia symptoms she could have very easily aspirated in the past ?DM- usually well controlled per patient ?Dispo- RTC in 1 year with repeat CT of chest w/o contrast... She needs to follow up with Nephrologist/Primary for evaluation of Bradycardia, elevated creatinine, and hypotension.. I suspect her antihypertensive regimen can be decreased.   ? ? ?Risk Modification: ? ?Statin:  Y ? ?Smoking cessation instruction/counseling given:  None Smoker ? ?Patient was counseled on importance of Blood Pressure Control.  Despite Medical intervention if the patient notices persistently elevated blood pressure readings.  They are instructed to contact their Primary Care Physician ? ?Please avoid use of Fluoroquinolones as this can potentially increase your risk of Aortic Rupture and/or Dissection ? ?Patient educated on signs and symptoms of Aortic Dissection, handout also provided in  AVS ? ?Ellwood Handler, PA-C ?06/23/21 ? ? ? ? ?

## 2021-06-24 ENCOUNTER — Other Ambulatory Visit: Payer: Medicare HMO

## 2021-07-09 DIAGNOSIS — I10 Essential (primary) hypertension: Secondary | ICD-10-CM | POA: Diagnosis not present

## 2021-07-09 DIAGNOSIS — E785 Hyperlipidemia, unspecified: Secondary | ICD-10-CM | POA: Diagnosis not present

## 2021-07-09 DIAGNOSIS — E1169 Type 2 diabetes mellitus with other specified complication: Secondary | ICD-10-CM | POA: Diagnosis not present

## 2021-07-12 ENCOUNTER — Telehealth: Payer: Self-pay | Admitting: Pharmacist

## 2021-07-12 NOTE — Progress Notes (Signed)
? ? ?Chronic Care Management ?Pharmacy Assistant  ? ?Name: [name]  MRN: [id number] DOB: Feb 05, 1951 ? ? ?Reason for Encounter: Disease State - General Adherence Call  ?  ? ? ?Recent office visits:  ?None noted.  ? ?Recent consult visits:  ?06/23/21 Ellwood Handler PA-C - Cardiothoracic Surgery  - Ascending Aortic Aneurysm - No medication changes noted. Follow up in  year with repeat CT of chest w/o contrast. Follow up with PCP for Bradycardia and possible reduction of BP regimen.  ? ?Hospital visits: 06/20/21-06/21/21 ?Medication Reconciliation was completed by comparing discharge summary, patient?s EMR and Pharmacy list, and upon discussion with patient. ? ?Admitted to the hospital on 06/20/21 due to Hyperglycemia. Discharge date was 06/21/21. Discharged from [facility]. ? ?New?Medications Started at [facility] Discharge:?? ?None noted.  ? ?Medication Changes at Hospital Discharge: ?None noted.  ? ?Medications Discontinued at Hospital Discharge: ?None noted.  ? ?Medications that remain the same after Hospital Discharge:??  ?All other medications will remain the same.   ? ?Medications: ?Outpatient Encounter Medications as of 07/12/2021  ?Medication Sig  ? acetaminophen (TYLENOL) 500 MG tablet Take 500-1,000 mg by mouth every 6 (six) hours as needed for moderate pain, headache or mild pain.  ? Alcohol Swabs (B-D SINGLE USE SWABS REGULAR) PADS Use as directed.  ? amLODipine (NORVASC) 10 MG tablet Take 1 tablet (10 mg total) by mouth daily.  ? aspirin 81 MG tablet Take 81 mg by mouth daily.  ? Blood Glucose Monitoring Suppl (TRUE METRIX AIR GLUCOSE METER) w/Device KIT 1 kit by Does not apply route 3 (three) times daily.  ? carvedilol (COREG) 6.25 MG tablet Take 1 tablet (6.25 mg total) by mouth 2 (two) times daily with a meal.  ? famotidine (PEPCID) 40 MG tablet TAKE 1 TABLET BY MOUTH EVERY DAY  ? ferrous gluconate (FERGON) 324 MG tablet TAKE 1 TABLET (324 MG TOTAL) BY MOUTH 2 (TWO) TIMES DAILY WITH A MEAL.  ?  glipiZIDE (GLUCOTROL XL) 5 MG 24 hr tablet TAKE 1 TABLET BY MOUTH EVERY DAY WITH BREAKFAST  ? glucose blood (TRUE METRIX BLOOD GLUCOSE TEST) test strip Use as instructed  ? hydrALAZINE (APRESOLINE) 50 MG tablet Take 1 tablet (50 mg total) by mouth 3 (three) times daily.  ? insulin NPH-regular Human (NOVOLIN 70/30 RELION) (70-30) 100 UNIT/ML injection Inject 45 Units into the skin 2 (two) times daily with a meal.  ? levothyroxine (SYNTHROID) 100 MCG tablet Take 1 tablet (100 mcg total) by mouth daily.  ? losartan (COZAAR) 25 MG tablet Take 1 tablet (25 mg total) by mouth daily.  ? metoCLOPramide (REGLAN) 10 MG tablet TAKE 1 TABLET FOUR TIMES DAILY  ? ondansetron (ZOFRAN) 4 MG tablet Take 1 tablet (4 mg total) by mouth every 8 (eight) hours as needed for nausea or vomiting.  ? pantoprazole (PROTONIX) 40 MG tablet Take 1 tablet (40 mg total) by mouth 2 (two) times daily.  ? polyethylene glycol (MIRALAX / GLYCOLAX) packet Take 17 g by mouth daily as needed for moderate constipation (MIX AND DRINK).  ? rosuvastatin (CRESTOR) 20 MG tablet Take 1 tablet (20 mg total) by mouth at bedtime.  ? SUMAtriptan (IMITREX) 50 MG tablet TAKE 1 TAB EVERY 2 HRS AS NEEDED FOR MIGRAINE. MAY REPEAT IN 2 HOURS IF HEADACHE PERSISTS OR RECURS.  ? torsemide (DEMADEX) 20 MG tablet Take 1 tablet (20 mg total) by mouth 2 (two) times daily.  ? TRUEplus Lancets 33G MISC Use as directed  ? ?No facility-administered encounter  medications on file as of 07/12/2021.  ? ? ?Have you had any problems recently with your health? ?Patient reports she has had some increased dizziness and lightheadedness. Was seen by cardiology last week and had appt to follow up with PCP for possible changes in medication. She is checking her blood pressures daily and reports they have been in the 100-117 / 70 range.  ? ?Have you had any problems with your pharmacy? ?Patient denied any problems with her pharmacy currently.  ? ?What issues or side effects are you having with your  medications? ?Patient denied any side effects with her medications other than ? ?What would you like me to pass along to Leata Mouse, [name] for them to help you with?  ?Blood pressure yesterday 117/73. She has been having episodes of being dizzy and light headed. She has appt with T[facility] on Thursday 4/6 to discuss changing or reducing her blood pressure medicine and possibly reducing it.  ? ?What can we do to take care of you better? ?Patient did not have any recommendations at this time. She thanked me for the follow up.She will reach out to [place] if she has any further questions or concerns after her visit with PCP this week.  ? ?Care Gaps ?  ?AWV: done 06/25/20 ?Colonoscopy: overdue 06/09/13 upper EGD done 08/09/19 ?DM Eye Exam:  done 04/20/21 ?DM Foot Exam: done 11/04/20 ?Microalbumin: overdue ?HbgAIC: done 10/28/20 (6.9) due 04/30/21 ?DEXA: done 03/25/20 ?Mammogram: done 08/25/20 ? ? ?Star Rating Drugs: ?rosuvastatin (CRESTOR) 20 MG tablet - last filled 03/08/21 90 days (uses mail order) ?losartan (COZAAR) 25 MG tablet - last filled 06/08/21 90 days (uses mail order) ?glipiZIDE (GLUCOTROL XL) 5 MG 24 hr tablet - last filled 02/08/21 90 day (uses mail order) ? ? ?Future Appointments  ?Date Time Provider [facility]  ?07/15/2021 11:00 AM [name], MD [facility]  ?08/04/2021 11:15 AM Gardiner Barefoot, DPM [facility]  ?09/22/2021  9:00 AM [facility] PHARMACIST [facility]  ? ? ? ?Liza Showfety, CCMA ?Clinical Pharmacist Assistant  ?[phone] ? ? ?

## 2021-07-15 ENCOUNTER — Encounter: Payer: Self-pay | Admitting: Family Medicine

## 2021-07-15 ENCOUNTER — Ambulatory Visit (INDEPENDENT_AMBULATORY_CARE_PROVIDER_SITE_OTHER): Payer: Medicare HMO | Admitting: Family Medicine

## 2021-07-15 VITALS — BP 136/60 | HR 55 | Temp 97.8°F | Resp 16 | Wt 205.2 lb

## 2021-07-15 DIAGNOSIS — R432 Parageusia: Secondary | ICD-10-CM | POA: Diagnosis not present

## 2021-07-15 DIAGNOSIS — K148 Other diseases of tongue: Secondary | ICD-10-CM

## 2021-07-15 NOTE — Patient Instructions (Signed)
Follow up as needed or as scheduled ?We'll call you with your ENT appt ?Continue to gargle w/ salt water ?Call with any questions or concerns ?Stay Safe!  Stay Healthy! ?Happy Easter!!! ?

## 2021-07-15 NOTE — Progress Notes (Signed)
? ?  Subjective:  ? ? Patient ID: [name], female    DOB: 11/07/1950, 71 y.o.   MRN: [id number] ? ?HPI ?Tongue lesion- L sided.  'it was real brown' to start with but she feels it is not as dark at this time.  Reports food tastes sour- especially meat.  Pt reports it has been present for 'a couple of months'.  Area is painful.  No bleeding or drainage.  'it's just sore'.  Has been using mouthwash, peroxide, warm salt water.  Has not been to a dentist recently- 'i need a valium to go see a dentist'  Painful to eat ? ?Review of Systems ?For ROS see HPI  ? ?   ?Objective:  ? Physical Exam ?Vitals reviewed.  ?Constitutional:   ?   General: She is not in acute distress. ?   Appearance: Normal appearance. She is obese. She is not ill-appearing.  ?HENT:  ?   Head: Normocephalic and atraumatic.  ?   Nose: Nose normal.  ?   Mouth/Throat:  ?   Mouth: Mucous membranes are moist.  ?   Pharynx: Oropharynx is clear. No oropharyngeal exudate or posterior oropharyngeal erythema.  ?   Comments: L lateral tongue margin very TTP w/ small ulcerated.  No discoloration noted. ? ?No other oral lesions ?Neurological:  ?   General: No focal deficit present.  ?   Mental Status: She is alert and oriented to person, place, and time.  ?Psychiatric:     ?   Mood and Affect: Mood normal.     ?   Behavior: Behavior normal.     ?   Thought Content: Thought content normal.  ? ? ? ? ? ?   ?Assessment & Plan:  ?Tongue lesion- new.  L lateral edge.  Very TTP.  No obvious mass or abnormality but appears to be a small ulcerated area.  Will refer to ENT for complete evaluation and tx ? ?Altered taste- pt reports it's due to the lesion as her sxs started at the same time as the pain.  No evidence of thrush that would alter taste.  Will refer to ENT for complete evaluation and tx ? ?

## 2021-08-04 ENCOUNTER — Ambulatory Visit: Payer: Medicare HMO | Admitting: Podiatry

## 2021-08-06 IMAGING — DX DG CHEST 1V PORT
1 series · 1 of 1 positions shown · non-contrast
Comparison: 12/25/2017

CLINICAL DATA: Shortness of breath, cough

EXAM:
PORTABLE CHEST 1 VIEW

[chest ap]
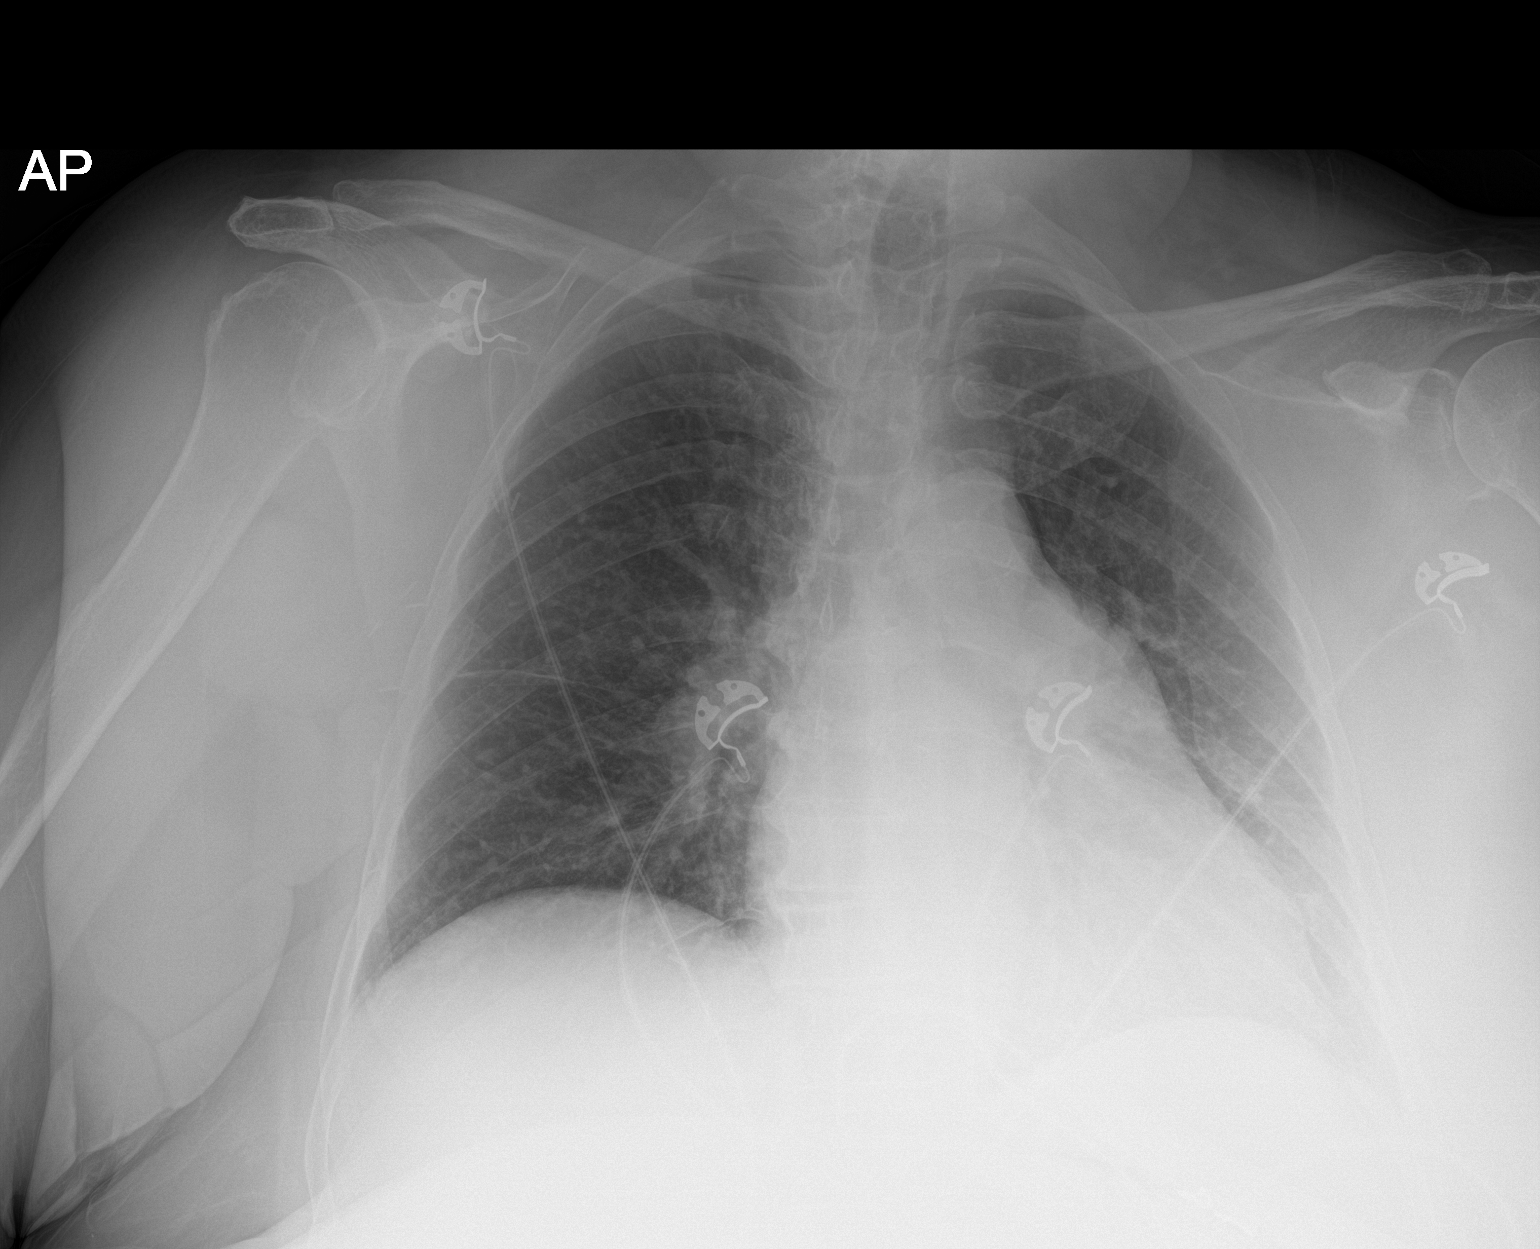

[1 of 1 positions shown; findings below may reference images not displayed]

FINDINGS: Cardiomediastinal contours are stable with enlargement of central
pulmonary vasculature. Lungs are clear.

No signs of pleural effusion.

Visualized skeletal structures are unremarkable aside from cervical
spinal fusion seen at the upper margin of the film.
IMPRESSION: Prominence of central pulmonary vasculature may be secondary to
pulmonary arterial hypertension, no consolidation or pleural
effusion.

## 2021-08-09 ENCOUNTER — Telehealth: Payer: Self-pay | Admitting: Pharmacist

## 2021-08-09 ENCOUNTER — Other Ambulatory Visit: Payer: Self-pay | Admitting: Family

## 2021-08-09 DIAGNOSIS — E1159 Type 2 diabetes mellitus with other circulatory complications: Secondary | ICD-10-CM

## 2021-08-09 NOTE — Progress Notes (Signed)
? ? ?Chronic Care Management ?Pharmacy Assistant  ? ?Name: [name]  MRN: [id number] DOB: 1950/10/18 ? ? ?Reason for Encounter: Disease State - General Adherence Call  ?  ? ?Recent office visits:  ?07/15/21 Annye Asa, MD - Family Medicine - Tongue lesion - Referral placed to ENT - Symptomatic care instructions given. Follow up as needed.  ? ? ?Recent consult visits:  ?None noted.  ? ?Hospital visits: 06/20/21-06/21/21 ?Medication Reconciliation was completed by comparing discharge summary, patient?s EMR and Pharmacy list, and upon discussion with patient. ?  ?Admitted to the hospital on 06/20/21 due to Hyperglycemia. Discharge date was 06/21/21. Discharged from [facility]. ?  ?New?Medications Started at [facility] Discharge:?? ?None noted.  ?  ?Medication Changes at Hospital Discharge: ?None noted.  ?  ?Medications Discontinued at Hospital Discharge: ?None noted.  ?  ?Medications that remain the same after Hospital Discharge:??  ?All other medications will remain the same. ? ?Medications: ?Outpatient Encounter Medications as of 08/09/2021  ?Medication Sig  ? acetaminophen (TYLENOL) 500 MG tablet Take 500-1,000 mg by mouth every 6 (six) hours as needed for moderate pain, headache or mild pain.  ? Alcohol Swabs (B-D SINGLE USE SWABS REGULAR) PADS Use as directed.  ? amLODipine (NORVASC) 10 MG tablet Take 1 tablet (10 mg total) by mouth daily.  ? aspirin 81 MG tablet Take 81 mg by mouth daily.  ? Blood Glucose Monitoring Suppl (TRUE METRIX AIR GLUCOSE METER) w/Device KIT 1 kit by Does not apply route 3 (three) times daily.  ? carvedilol (COREG) 6.25 MG tablet Take 1 tablet (6.25 mg total) by mouth 2 (two) times daily with a meal.  ? famotidine (PEPCID) 40 MG tablet TAKE 1 TABLET BY MOUTH EVERY DAY  ? ferrous gluconate (FERGON) 324 MG tablet TAKE 1 TABLET (324 MG TOTAL) BY MOUTH 2 (TWO) TIMES DAILY WITH A MEAL.  ? glipiZIDE (GLUCOTROL XL) 5 MG 24 hr tablet TAKE 1 TABLET BY MOUTH EVERY DAY WITH BREAKFAST   ? glucose blood (TRUE METRIX BLOOD GLUCOSE TEST) test strip Use as instructed  ? hydrALAZINE (APRESOLINE) 50 MG tablet Take 1 tablet (50 mg total) by mouth 3 (three) times daily.  ? insulin NPH-regular Human (NOVOLIN 70/30 RELION) (70-30) 100 UNIT/ML injection Inject 45 Units into the skin 2 (two) times daily with a meal.  ? levothyroxine (SYNTHROID) 100 MCG tablet Take 1 tablet (100 mcg total) by mouth daily.  ? losartan (COZAAR) 25 MG tablet Take 1 tablet (25 mg total) by mouth daily.  ? metoCLOPramide (REGLAN) 10 MG tablet TAKE 1 TABLET FOUR TIMES DAILY  ? ondansetron (ZOFRAN) 4 MG tablet Take 1 tablet (4 mg total) by mouth every 8 (eight) hours as needed for nausea or vomiting.  ? pantoprazole (PROTONIX) 40 MG tablet Take 1 tablet (40 mg total) by mouth 2 (two) times daily.  ? polyethylene glycol (MIRALAX / GLYCOLAX) packet Take 17 g by mouth daily as needed for moderate constipation (MIX AND DRINK).  ? rosuvastatin (CRESTOR) 20 MG tablet Take 1 tablet (20 mg total) by mouth at bedtime.  ? SUMAtriptan (IMITREX) 50 MG tablet TAKE 1 TAB EVERY 2 HRS AS NEEDED FOR MIGRAINE. MAY REPEAT IN 2 HOURS IF HEADACHE PERSISTS OR RECURS.  ? torsemide (DEMADEX) 20 MG tablet Take 1 tablet (20 mg total) by mouth 2 (two) times daily.  ? TRUEplus Lancets 33G MISC Use as directed  ? ?No facility-administered encounter medications on file as of 08/09/2021.  ? ? ?[place] for  General Review Call ? ? ?Chart Review: ? ?Have there been any documented new, changed, or discontinued medications since last visit? No (If yes, include name, dose, frequency, date) ?Has there been any documented recent hospitalizations or ED visits since last visit with Clinical Pharmacist? Yes ?Brief Summary (including medication and/or Diagnosis changes): 06/20/21 ED visit for Hyperglycemia.  ? ? ?Adherence Review: ? ?Does the Clinical Pharmacist Assistant have access to adherence rates? Yes ?Adherence rates for STAR metric medications (List  medication(s)/day supply/ last 2 fill dates). ?Adherence rates for medications indicated for disease state being reviewed (List medication(s)/day supply/ last 2 fill dates). ?Does the patient have >5 day gap between last estimated fill dates for any of the above medications or other medication gaps? No ?Reason for medication gaps. ? ? ?Disease State Questions: ? ?Able to connect with Patient? Yes ?Did patient have any problems with their health recently? Yes. Patient continues to have chronic back pain. ?Note problems and Concerns: ?Have you had any admissions or emergency room visits or worsening of your condition(s) since last visit? Yes ?Details of ED visit, hospital visit and/or worsening condition(s):  ?Have you had any visits with new specialists or providers since your last visit? No ?Explain: ?Have you had any new health care problem(s) since your last visit? Yes ?New problem(s) reported: tongue lesion visit with PCP 07/15/21 ?Have you run out of any of your medications since you last spoke with clinical pharmacist? No ?What caused you to run out of your medications? ?Are there any medications you are not taking as prescribed? No ?What kept you from taking your medications as prescribed? ?Are you having any issues or side effects with your medications? No ?Note of issues or side effects: ?Do you have any other health concerns or questions you want to discuss with your Clinical Pharmacist before your next visit? No ?Note additional concerns and questions from Patient. ?Are there any health concerns that you feel we can do a better job addressing? No ?Note Patient's response. ?Are you having any problems with any of the following since the last visit: (select all that apply) ? None ? Details: ?12. Any falls since last visit? No ? Details: ?13. Any increased or uncontrolled pain since last visit? No ? Details: ?14. Next visit Type: Telephone ?      Visit with: Pharmacist ?       Date: 09/22/21  ?       Time: 9:00  am ? ?15. Additional Details? Patient has started [place] and doing well so far. ? ? ? ?Care Gaps ?  ?AWV: done 06/25/20 ?Colonoscopy: overdue 06/09/13 upper EGD done 08/09/19 ?DM Eye Exam:  done 04/20/21 ?DM Foot Exam: done 11/04/20 ?Microalbumin: overdue ?HbgAIC: done 10/28/20 (6.9) due 04/30/21 ?DEXA: done 03/25/20 ?Mammogram: done 08/25/20 ? ? ?Star Rating Drugs: ?rosuvastatin (CRESTOR) 20 MG tablet - (uses mail order) ?losartan (COZAAR) 25 MG tablet -  (uses mail order) ?glipiZIDE (GLUCOTROL XL) 5 MG 24 hr tablet - (uses mail order) ? ? ? ?Future Appointments  ?Date Time Provider [place]  ?09/22/2021  9:00 AM [facility] PHARMACIST [facility]  ? ? ?Liza Showfety, CCMA ?Clinical Pharmacist Assistant  ?[phone] ?  ?

## 2021-08-09 NOTE — Telephone Encounter (Signed)
Patient returned call. Messaged Elizabeth with no response.  ?

## 2021-08-10 ENCOUNTER — Telehealth: Payer: Self-pay | Admitting: Pharmacist

## 2021-08-10 IMAGING — DX DG CHEST 1V PORT
1 series · 1 of 1 positions shown · non-contrast
Comparison: [DATE], 12/25/2017.  CT 04/15/2019.

CLINICAL DATA: Cough.

EXAM:
PORTABLE CHEST 1 VIEW

[chest ap]
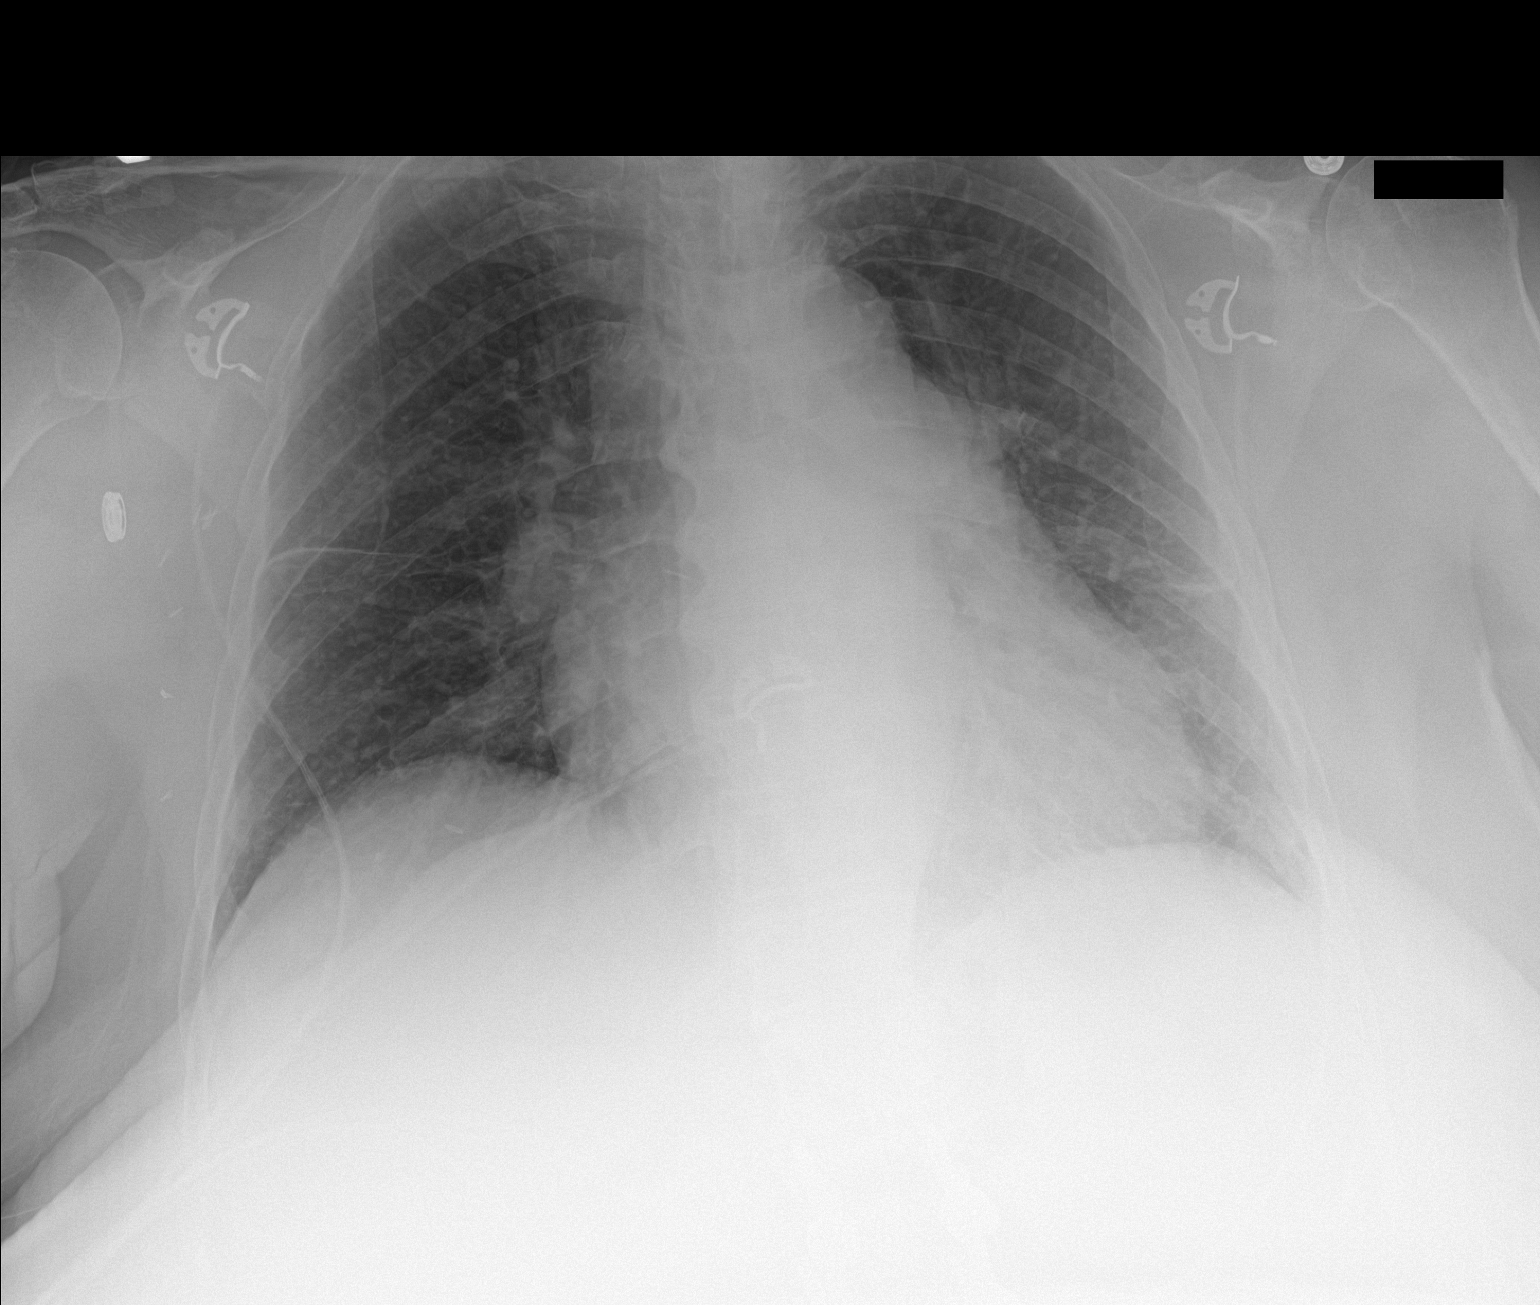

[1 of 1 positions shown; findings below may reference images not displayed]

FINDINGS: Mediastinum and hilar structures normal. Mild cardiomegaly. No
pulmonary venous congestion. Stable mild bilateral
pleural-parenchymal thickening consistent with scarring. No acute
infiltrate. No pleural effusion or pneumothorax. Surgical clips
right chest.
IMPRESSION: 1.  Mild cardiomegaly.  No pulmonary venous congestion.

2. No acute pulmonary disease. Disease identified. Chest is stable
from prior exam.

## 2021-08-10 NOTE — Progress Notes (Signed)
? ? ?Chronic Care Management ?Pharmacy Note ?Summary: ?PharmD follow up due to some concerns with medication.  Glipizide making her feel jittery but she was not eating for a long period after her glipizide and morning insulin.  Discussed importance of eating regular meals.  Have also asked her to call and make appt with Dr. Birdie Riddle for FU labs (A1c, GFR, TSH) ? ? ?08/11/2021 ?Name:  [name] MRN:  [id number] DOB:  06-Oct-1950 ? ?Subjective: ?[name] is an 71 y.o. year old female who is a primary patient of Tabori, Aundra Millet, MD.  The CCM team was consulted for assistance with disease management and care coordination needs.   ? ?Engaged with patient by telephone for follow up visit in response to provider referral for pharmacy case management and/or care coordination services.  ? ?Consent to Services:  ?The patient was given information about Chronic Care Management services, agreed to services, and gave verbal consent prior to initiation of services.  Please see initial visit note for detailed documentation.  ? ?Patient Care Team: ?[name], MD as PCP - General (Family Medicine) ?[name], MD as PCP - Cardiology (Cardiology) ?Cassandria Anger, MD as Consulting Physician (Endocrinology) ?[name], DPM as Consulting Physician (Podiatry) ?[name], MD as Consulting Physician (Pulmonary Disease) ?[name], MD as Consulting Physician (Ophthalmology) ?Claudia Desanctis, MD as Consulting Physician (Nephrology) ?Edythe Clarity, [facility] (Pharmacist) ? ?Objective: ? ?Lab Results  ?Component Value Date  ? CREATININE 2.37 (H) 06/20/2021  ? CREATININE 1.43 (H) 02/05/2021  ? CREATININE 2.05 (H) 07/06/2020  ? ? ?Lab Results  ?Component Value Date  ? HGBA1C 9.9 (A) 10/28/2020  ? ?Last diabetic Eye exam:  ?Lab Results  ?Component Value Date/Time  ? HMDIABEYEEXA Retinopathy (A) 03/17/2021 12:00 AM  ?  ?Last diabetic Foot exam:  ?Lab Results  ?Component Value Date/Time   ? HMDIABFOOTEX yes 09/21/2009 12:00 AM  ?  ? ?   ?Component Value Date/Time  ? CHOL 162 02/05/2021 0947  ? TRIG 210 (H) 02/05/2021 0947  ? HDL 51 02/05/2021 0947  ? CHOLHDL 3.2 06/02/2020 0816  ? VLDL 42 (H) 04/17/2019 0227  ? [place] 76 02/05/2021 0947  ? [place] 60 06/02/2020 0816  ? ? ? ?  Latest Ref Rng & Units 02/05/2021  ?  9:47 AM 07/06/2020  ?  4:49 PM 06/02/2020  ?  8:16 AM  ?Hepatic Function  ?Total Protein 6.0 - 8.5 g/dL 6.7   8.0   7.6    ?Albumin 3.7 - 4.7 g/dL 3.9   4.0     ?AST 0 - 40 IU/L '12   18   14    ' ?ALT 0 - 32 IU/L '10   15   11    ' ?Alk Phosphatase 44 - 121 IU/L 58   55     ?Total Bilirubin 0.0 - 1.2 mg/dL 0.4   0.6   0.3    ?Bilirubin, Direct 0.00 - 0.40 mg/dL 0.14      ? ? ?Lab Results  ?Component Value Date/Time  ? TSH 0.417 (L) 10/23/2020 01:32 PM  ? TSH 1.35 06/02/2020 08:16 AM  ? FREET4 1.29 10/23/2020 01:32 PM  ? FREET4 1.2 06/02/2020 08:16 AM  ? ? ? ?  Latest Ref Rng & Units 06/20/2021  ? 12:13 AM 02/05/2021  ?  9:47 AM 07/06/2020  ?  4:49 PM  ?CBC  ?WBC 4.0 - 10.5 K/uL 8.8   6.7   8.9    ?  Hemoglobin 12.0 - 15.0 g/dL 11.2   13.5   12.0    ?Hematocrit 36.0 - 46.0 % 36.1   42.6   37.9    ?Platelets 150 - 400 K/uL 328   288   291    ? ? ?Lab Results  ?Component Value Date/Time  ? VD25OH 15 (L) 06/02/2020 08:16 AM  ? VD25OH 17 (L) 10/02/2018 11:59 AM  ? ?Clinical ASCVD:  ?The 10-year ASCVD risk score (Arnett DK, et al., 2019) is: 26.4% ?  Values used to calculate the score: ?    Age: 55 years ?    Sex: Female ?    Is Non-Hispanic African American: Yes ?    Diabetic: Yes ?    Tobacco smoker: No ?    Systolic Blood Pressure: 532 mmHg ?    Is BP treated: Yes ?    HDL Cholesterol: 51 mg/dL ?    Total Cholesterol: 162 mg/dL   ? ? ?Social History  ? ?Tobacco Use  ?Smoking Status Never  ?Smokeless Tobacco Never  ? ?BP Readings from Last 3 Encounters:  ?07/15/21 136/60  ?06/23/21 (!) 115/53  ?06/21/21 (!) 151/65  ? ?Pulse Readings from Last 3 Encounters:  ?07/15/21 (!) 55  ?06/23/21 60  ?06/21/21 62   ? ?Wt Readings from Last 3 Encounters:  ?07/15/21 205 lb 3.2 oz (93.1 kg)  ?06/23/21 197 lb (89.4 kg)  ?04/01/21 203 lb 6.4 oz (92.3 kg)  ? ? ?Assessment: Review of patient past medical history, allergies, medications, health status, including review of consultants reports, laboratory and other test data, was performed as part of comprehensive evaluation and provision of chronic care management services.  ? ?SDOH:  (Social Determinants of Health) assessments and interventions performed: Yes ? ? ?[place] ? ?Allergies  ?Allergen Reactions  ? Dilaudid [Hydromorphone Hcl] Nausea And Vomiting  ? Hydromorphone Nausea And Vomiting  ? Lipitor [Atorvastatin] Other (See Comments)  ?  Body & Muscle Aches  ? Adhesive [Tape] Rash  ? Ampicillin Rash and Other (See Comments)  ?  Also developed welts on her hands when she took it in the hospital  ? Latex Itching, Dermatitis, Rash and Other (See Comments)  ?  Patient had a reaction to tape after surgery and they told her she was allergic to Latex ?  ? Penicillins Rash and Other (See Comments)  ?  Has patient had a PCN reaction causing immediate rash, facial/tongue/throat swelling, SOB or lightheadedness with hypotension: No ?Has patient had a PCN reaction causing severe rash involving mucus membranes or skin necrosis: No ?Has patient had a PCN reaction that required hospitalization: Yes - in hospital receiving treatment ?Has patient had a PCN reaction occurring within the last 10 years: No ?If all of the above answers are "NO", then may proceed with Cephalosporin use. ?  ? ? ?Medications Reviewed Today   ? ? Reviewed by Edythe Clarity, RPH (Pharmacist) on 08/11/21 at 1204  Med List Status: <None>  ? ?Medication Order Taking? Sig Documenting Provider Last Dose Status Informant  ?acetaminophen (TYLENOL) 500 MG tablet [id number] Yes Take 500-1,000 mg by mouth every 6 (six) hours as needed for moderate pain, headache or mild pain. [provider] Taking Active Self   ?Alcohol Swabs (B-D SINGLE USE SWABS REGULAR) PADS [id number] Yes Use as directed. Dutch Quint B, FNP Taking Active   ?amLODipine (NORVASC) 10 MG tablet [id number] Yes Take 1 tablet (10 mg total) by mouth daily. Kennyth Arnold, FNP Taking Active   ?  aspirin 81 MG tablet [id number] Yes Take 81 mg by mouth daily. [provider] Taking Active Self  ?Blood Glucose Monitoring Suppl (TRUE METRIX AIR GLUCOSE METER) w/Device KIT [id number] Yes 1 kit by Does not apply route 3 (three) times daily. [name], MD Taking Active Self  ?carvedilol (COREG) 6.25 MG tablet [id number] Yes Take 1 tablet (6.25 mg total) by mouth 2 (two) times daily with a meal. Dutch Quint B, FNP Taking Active   ?famotidine (PEPCID) 40 MG tablet [id number] Yes TAKE 1 TABLET BY MOUTH EVERY DAY [name], MD Taking Active   ?ferrous gluconate (FERGON) 324 MG tablet [id number] Yes TAKE 1 TABLET (324 MG TOTAL) BY MOUTH 2 (TWO) TIMES DAILY WITH A MEAL. Julian Hy, DO Taking Active Self  ?glipiZIDE (GLUCOTROL XL) 5 MG 24 hr tablet [id number] Yes TAKE 1 TABLET BY MOUTH EVERY DAY WITH BREAKFAST Dutch Quint B, FNP Taking Active   ?glucose blood (TRUE METRIX BLOOD GLUCOSE TEST) test strip [id number] Yes Use as instructed Kennyth Arnold, FNP Taking Active   ?hydrALAZINE (APRESOLINE) 50 MG tablet [id number] Yes Take 1 tablet (50 mg total) by mouth 3 (three) times daily. Kennyth Arnold, FNP Taking Active   ?insulin NPH-regular Human (NOVOLIN 70/30 RELION) (70-30) 100 UNIT/ML injection [id number] Yes Inject 45 Units into the skin 2 (two) times daily with a meal. Dutch Quint B, FNP Taking Active   ?levothyroxine (SYNTHROID) 100 MCG tablet [id number] Yes Take 1 tablet (100 mcg total) by mouth daily. Dutch Quint B, FNP Taking Active   ?losartan (COZAAR) 25 MG tablet [id number] Yes Take 1 tablet (25 mg total) by mouth daily. Dutch Quint B, FNP Taking Active   ?metoCLOPramide (REGLAN) 10 MG tablet [id number] Yes TAKE 1 TABLET FOUR TIMES  DAILY Dutch Quint B, FNP Taking Active   ?ondansetron (ZOFRAN) 4 MG tablet [id number] Yes Take 1 tablet (4 mg total) by mouth every 8 (eight) hours as needed for nausea or vomiting. Clarene Essex

## 2021-08-10 NOTE — Progress Notes (Signed)
? ? ?  Chronic Care Management ?Pharmacy Assistant  ? ?Name: [name]  MRN: [id number] DOB: 09/02/1950 ? ? ?Reason for Encounter: Patient Call about Medication Concerns ?  ?Patient called and stated she had some concerns about her Glipizide causing her to feel "gittery" and her Fosamax causing her to have a bad taste in her mouth when she eats meat. Discussed in length and advised her to make an appt with [name] to discuss further. Telephone appt made for 08/11/21 11am with Leata Mouse [name]. Patient voiced understanding and confirmed appointment to discuss further with [name].  ? ? ?Liza Showfety, CCMA ?Clinical Pharmacist Assistant  ?([phone]  ?

## 2021-08-10 NOTE — Telephone Encounter (Signed)
Medication previously refilled by Dutch Quint, okay to send in refills? ? ?

## 2021-08-11 ENCOUNTER — Ambulatory Visit (INDEPENDENT_AMBULATORY_CARE_PROVIDER_SITE_OTHER): Payer: Medicare HMO | Admitting: Pharmacist

## 2021-08-11 DIAGNOSIS — E1159 Type 2 diabetes mellitus with other circulatory complications: Secondary | ICD-10-CM

## 2021-08-11 DIAGNOSIS — M816 Localized osteoporosis [Lequesne]: Secondary | ICD-10-CM

## 2021-08-11 NOTE — Patient Instructions (Signed)
Visit Information ? ? Goals Addressed   ? ?  ?  ?  ?  ?  ? This Visit's Progress  ?   A1C <7.5%   Not on track  ?     ?   Set Target A1c (pt-stated)   On track  ?   Timeframe:  Long-Range Goal ?Priority:  Medium ?Start Date:    02/18/2020                         ?Expected End Date August 2023                 ? ?  ? ?Barriers: ?Health Behaviors ?Knowledge  ? ?- set target A1C-maintain level of 7.0 or less  ?-patient will monitor cbgs more frequently in the home ?-patient will adhere to diabetic diet at least 75-100% of the time. ?  ?Why is this important?   ?Your target A1C is decided together by you and your doctor.  ?It is based on several things like your age and other health issues.  ?  ?Notes:  ?04/21/20-Most recent A1C on file 8.8(Nov 2021) ?06/24/2020-Most recent A1C level7.9(Feb 2022) ?09/10/20-Patient due for A1C testing-last reading 7.9 in Feb.  ? ?12/10/2020-Most recent A1C 9.9(July 2022). ? ?54/00/86-PYPPJKDTO due to duplicate care plan and goals ?  ? ?  ? ? ?Long-Range Goal: Disease management   ?Start Date: 08/31/2020  ?Expected End Date: 08/31/2021  ?Recent Progress: On track  ?Priority: High  ?Note:   ?Pharmacist Clinical Goal(s):  ?Patient will achieve adherence to monitoring guidelines and medication adherence to achieve therapeutic efficacy ?contact provider office for questions/concerns as evidenced notation of same in electronic health record through collaboration with PharmD and provider.  ? ?Interventions: ?1:1 collaboration with [name], MD regarding development and update of comprehensive plan of care as evidenced by provider attestation and co-signature ?Inter-disciplinary care team collaboration (see longitudinal plan of care) ?Comprehensive medication review performed; medication list updated in electronic medical record ? ?Diabetes (A1c goal <7%) ?-Uncontrolled ?-Current medications: ?Novolin 70/30 40 units BID with a meal Appropriate, Effective ?Glipizide XL '5mg'$  dialy Appropriate,  Query effective ?-Medications previously tried: Anibal Henderson  ?-Current home glucose readings ?fasting glucose: not checking glucose ?post prandial glucose: not checking glucose regularly ?-Denies hypoglycemic/hyperglycemic symptoms ?-Current exercise: minimal ?-Educated on A1c and blood sugar goals; ?Prevention and management of hypoglycemic episodes; ?Benefits of routine self-monitoring of blood sugar; ?-Counseled to check feet daily and get yearly eye exams ?-Recommended to continue current medication ?Stressed importance of medication adherence.  She does report to taking insulin but does admit to missing some doses.  Have asked her to check glucose for next week and record - will adjust based off of reported readings. ? ?Update 08/11/21 ?Patient complains of glipizide making her feel "jittery."  After discussion it was discovered she was taking her insulin and glipizide in the morning and not eating anything for a significant amount of time after.  This can cause hypoglycemia.  She checked her glucose today on the call and it was 137.  This is not low so she should not be having signs of hypoglycemia, but warned her of that possibility.  Her last A1c was 9.9 and she has not had repeat done since then.  Recommended she make appt with PCP for new A1c. ?Could consider addition of SGLT-2 for HF benefit however I think kidney function would hold [place] back. ?She needs A1c recheck.  Recommended she switch  regular yogurt to [place].   ?Next visit will be in depth discussion on dietary changes and lifestyle. ?Hypertension (BP goal <130/80) ?-Controlled ?-At times not taking torsemide when leaving the house due to increased urination. As of today is not using carvedilol or amlodipine  ?-Current treatment: ?Hydralazine 50 mg three times daily Appropriate, Query effective, ?Losartan 25 mg once daily Appropriate, Query effective, ?Torsemide 20 mg BID Appropriate, Query effective, ?Amlodipine '10mg'$  daily Appropriate, Query  effective, ?Carvedilol 6.'26mg'$  twice daily Appropriate, Query effective, ?-Meds needing clarifcation: amlodipine, carvedilol ?-Current home readings: at goal ?-Current exercise habits: no formal exercise - limited due to chronic pain - planing on getting spinal cord stimulator  ?-Denies hypotensive/hypertensive symptoms ?-Educated on Daily salt intake goal < 2300 mg; ?Importance of home blood pressure monitoring; ?-Reviewed importance of being consistent with torsemide regimen  ?-Counseled to monitor BP at home 1-2x/week, document, and provide log at future appointments ?-Recommended to continue current medication  ? ?Update 06/09/21 ?Patient reports she has not been taking any of her oral medication on regular basis.  She reports a BP of systolic 376E one time, unclear how accurate this was.  She denies any dizziness or HA.  We spent most of the visit talking about importance of adherence.  Patient agrees to start taking her medications more regularly and agrees to me having someone check in with her regularly to see if she is taking her medications. ?She gets mail order which is preferred so [facility] services were not an option. ?We cannot adjust BP meds if she is not taking what she is prescribed. ?Will have CMA call patient next week to ensure she is back on track with medications. ? ?Osteoporosis (goal prevent fractures) ?-Controlled ?-Reports to be using consistently without issue  ?-Current regimen: ?Alendronate 70 mg once weekly Appropriate, Query effective, ,  ?-Reviewed side effects - no problems noted  ?-Last DEXA 03/2020 - osteoporosis ? ?Update 08/11/21 ?Not taking due to metallic taste of food in her mouth.  Also reports lesion on her tongue which she had checked out by Dr. Birdie Riddle.  Recommended she go to ENT.  I encouraged her to keep this appt for further eval. ?No changes at this time. ?Could consider Prolia or another option for bones if patient in agreeance. ? ?Hyperlipidemia: (LDL goal <  70) ?-Controlled based off of 05/2020 labs ?-Several cardiac risk factors  ?-Current treatment: ?Rosuvastatin 20 mg once daily (90 DS sent 4/22) ?-Reviewed adherence with patient - reports consistent use. Denies any recent side effects of problems ?-Educated on Cholesterol goals;  ?Benefits of statin for ASCVD risk reduction; ?-Recommended to continue current medication  ?-Per Dr Stanford Breed pt will need f/u imagining march 2023 on thoracic aortic aneursym  ? ?GERD (Goal: minimize symptoms, optimize medication safety) ?-Controlled ?-Daily ASA ?-Some ongoing symptoms but feels controlled well overall. -Several questions about metoclopramide and pantoprazole - reviewed at length with patient, all questions answered. ?-Current treatment: ?Pantoprazole 40 mg twice daily - 1 tablet twice daily  ?Metoclopramide 10 mg four times daily  ?-Dietary triggers reviewed at length  ?-No problems with current regimen per patient. No side effects noted ?-Recommended continue current medications ? ? ?Patient Goals/Self-Care Activities ?Patient will:  ?- target a minimum of 150 minutes of moderate intensity exercise weekly ?engage in dietary modifications by minimizing snacking  ? ?Follow Up Plan: Telephone follow up appointment with care management team member scheduled for: ?Medication Assistance: None required.  Patient affirms current coverage meets needs. ?  ? ?  ? ? ? ?  ? ?  The patient verbalized understanding of instructions, educational materials, and care plan provided today and declined offer to receive copy of patient instructions, educational materials, and care plan.  ?Telephone follow up appointment with pharmacy team member scheduled for: 6 months ? ?Edythe Clarity, [facility]  ?Beverly Milch, PharmD ?Clinical Pharmacist  ?[place] ?([phone] ?

## 2021-08-24 DIAGNOSIS — N1832 Chronic kidney disease, stage 3b: Secondary | ICD-10-CM | POA: Diagnosis not present

## 2021-08-31 DIAGNOSIS — E872 Acidosis, unspecified: Secondary | ICD-10-CM | POA: Diagnosis not present

## 2021-08-31 DIAGNOSIS — N1832 Chronic kidney disease, stage 3b: Secondary | ICD-10-CM | POA: Diagnosis not present

## 2021-08-31 DIAGNOSIS — R768 Other specified abnormal immunological findings in serum: Secondary | ICD-10-CM | POA: Diagnosis not present

## 2021-08-31 DIAGNOSIS — E1122 Type 2 diabetes mellitus with diabetic chronic kidney disease: Secondary | ICD-10-CM | POA: Diagnosis not present

## 2021-08-31 DIAGNOSIS — I129 Hypertensive chronic kidney disease with stage 1 through stage 4 chronic kidney disease, or unspecified chronic kidney disease: Secondary | ICD-10-CM | POA: Diagnosis not present

## 2021-08-31 DIAGNOSIS — N179 Acute kidney failure, unspecified: Secondary | ICD-10-CM | POA: Diagnosis not present

## 2021-08-31 DIAGNOSIS — R809 Proteinuria, unspecified: Secondary | ICD-10-CM | POA: Diagnosis not present

## 2021-08-31 DIAGNOSIS — R803 Bence Jones proteinuria: Secondary | ICD-10-CM | POA: Diagnosis not present

## 2021-09-01 DIAGNOSIS — M26623 Arthralgia of bilateral temporomandibular joint: Secondary | ICD-10-CM | POA: Diagnosis not present

## 2021-09-01 DIAGNOSIS — K029 Dental caries, unspecified: Secondary | ICD-10-CM | POA: Diagnosis not present

## 2021-09-01 DIAGNOSIS — R432 Parageusia: Secondary | ICD-10-CM | POA: Diagnosis not present

## 2021-09-08 DIAGNOSIS — M8580 Other specified disorders of bone density and structure, unspecified site: Secondary | ICD-10-CM

## 2021-09-08 DIAGNOSIS — E785 Hyperlipidemia, unspecified: Secondary | ICD-10-CM | POA: Diagnosis not present

## 2021-09-08 DIAGNOSIS — Z794 Long term (current) use of insulin: Secondary | ICD-10-CM

## 2021-09-08 DIAGNOSIS — E1169 Type 2 diabetes mellitus with other specified complication: Secondary | ICD-10-CM

## 2021-09-08 DIAGNOSIS — I10 Essential (primary) hypertension: Secondary | ICD-10-CM

## 2021-09-08 DIAGNOSIS — N1832 Chronic kidney disease, stage 3b: Secondary | ICD-10-CM | POA: Diagnosis not present

## 2021-09-14 NOTE — Progress Notes (Signed)
Chronic Care Management Pharmacy Note Summary: She never came in for FU and fasting labs.  BG 200s at home when she checks.  She does mention some increased frequency of HA lately. Asked her to schedule FU for fasting labs.  IF A1c elevated could consider GLP-1 and to replace glipizide.  I can assist with copay if necessary.   09/22/2021 Name:  [name] MRN:  [id number] DOB:  04/09/1951  Subjective: [name] is an 71 y.o. year old female who is a primary patient of Tabori, Aundra Millet, MD.  The CCM team was consulted for assistance with disease management and care coordination needs.    Engaged with patient by telephone for follow up visit in response to provider referral for pharmacy case management and/or care coordination services.   Consent to Services:  The patient was given information about Chronic Care Management services, agreed to services, and gave verbal consent prior to initiation of services.  Please see initial visit note for detailed documentation.   Patient Care Team: [name], MD as PCP - General (Family Medicine) [name], MD as PCP - Cardiology (Cardiology) Cassandria Anger, MD as Consulting Physician (Endocrinology) [name], DPM as Consulting Physician (Podiatry) [name], MD as Consulting Physician (Pulmonary Disease) [name], MD as Consulting Physician (Ophthalmology) Claudia Desanctis, MD as Consulting Physician (Nephrology) Edythe Clarity, [facility] (Pharmacist)  Objective:  Lab Results  Component Value Date   CREATININE 2.37 (H) 06/20/2021   CREATININE 1.43 (H) 02/05/2021   CREATININE 2.05 (H) 07/06/2020    Lab Results  Component Value Date   HGBA1C 9.9 (A) 10/28/2020   Last diabetic Eye exam:  Lab Results  Component Value Date/Time   HMDIABEYEEXA Retinopathy (A) 03/17/2021 12:00 AM    Last diabetic Foot exam:  Lab Results  Component Value Date/Time   HMDIABFOOTEX yes  09/21/2009 12:00 AM        Component Value Date/Time   CHOL 162 02/05/2021 0947   TRIG 210 (H) 02/05/2021 0947   HDL 51 02/05/2021 0947   CHOLHDL 3.2 06/02/2020 0816   VLDL 42 (H) 04/17/2019 0227   LDLCALC 76 02/05/2021 0947   LDLCALC 60 06/02/2020 0816       Latest Ref Rng & Units 02/05/2021    9:47 AM 07/06/2020    4:49 PM 06/02/2020    8:16 AM  Hepatic Function  Total Protein 6.0 - 8.5 g/dL 6.7  8.0  7.6   Albumin 3.7 - 4.7 g/dL 3.9  4.0    AST 0 - 40 IU/L '12  18  14   ' ALT 0 - 32 IU/L '10  15  11   ' Alk Phosphatase 44 - 121 IU/L 58  55    Total Bilirubin 0.0 - 1.2 mg/dL 0.4  0.6  0.3   Bilirubin, Direct 0.00 - 0.40 mg/dL 0.14       Lab Results  Component Value Date/Time   TSH 0.417 (L) 10/23/2020 01:32 PM   TSH 1.35 06/02/2020 08:16 AM   FREET4 1.29 10/23/2020 01:32 PM   FREET4 1.2 06/02/2020 08:16 AM       Latest Ref Rng & Units 06/20/2021   12:13 AM 02/05/2021    9:47 AM 07/06/2020    4:49 PM  CBC  WBC 4.0 - 10.5 K/uL 8.8  6.7  8.9   Hemoglobin 12.0 - 15.0 g/dL 11.2  13.5  12.0   Hematocrit 36.0 - 46.0 % 36.1  42.6  37.9   Platelets 150 - 400 K/uL 328  288  291     Lab Results  Component Value Date/Time   VD25OH 15 (L) 06/02/2020 08:16 AM   VD25OH 17 (L) 10/02/2018 11:59 AM   Clinical ASCVD:  The 10-year ASCVD risk score (Arnett DK, et al., 2019) is: 26.4%   Values used to calculate the score:     Age: 27 years     Sex: Female     Is Non-Hispanic African American: Yes     Diabetic: Yes     Tobacco smoker: No     Systolic Blood Pressure: 867 mmHg     Is BP treated: Yes     HDL Cholesterol: 51 mg/dL     Total Cholesterol: 162 mg/dL     Social History   Tobacco Use  Smoking Status Never  Smokeless Tobacco Never   BP Readings from Last 3 Encounters:  07/15/21 136/60  06/23/21 (!) 115/53  06/21/21 (!) 151/65   Pulse Readings from Last 3 Encounters:  07/15/21 (!) 55  06/23/21 60  06/21/21 62   Wt Readings from Last 3 Encounters:  07/15/21  205 lb 3.2 oz (93.1 kg)  06/23/21 197 lb (89.4 kg)  04/01/21 203 lb 6.4 oz (92.3 kg)    Assessment: Review of patient past medical history, allergies, medications, health status, including review of consultants reports, laboratory and other test data, was performed as part of comprehensive evaluation and provision of chronic care management services.   SDOH:  (Social Determinants of Health) assessments and interventions performed: Yes   CCM Care Plan  Allergies  Allergen Reactions   Dilaudid [Hydromorphone Hcl] Nausea And Vomiting   Hydromorphone Nausea And Vomiting   Lipitor [Atorvastatin] Other (See Comments)    Body & Muscle Aches   Adhesive [Tape] Rash   Ampicillin Rash and Other (See Comments)    Also developed welts on her hands when she took it in the hospital   Latex Itching, Dermatitis, Rash and Other (See Comments)    Patient had a reaction to tape after surgery and they told her she was allergic to Latex    Penicillins Rash and Other (See Comments)    Has patient had a PCN reaction causing immediate rash, facial/tongue/throat swelling, SOB or lightheadedness with hypotension: No Has patient had a PCN reaction causing severe rash involving mucus membranes or skin necrosis: No Has patient had a PCN reaction that required hospitalization: Yes - in hospital receiving treatment Has patient had a PCN reaction occurring within the last 10 years: No If all of the above answers are "NO", then may proceed with Cephalosporin use.     Medications Reviewed Today     Reviewed by Edythe Clarity, [facility] (Pharmacist) on 09/22/21 at 0925  Med List Status: <None>   Medication Order Taking? Sig Documenting Provider Last Dose Status Informant  acetaminophen (TYLENOL) 500 MG tablet [id number] Yes Take 500-1,000 mg by mouth every 6 (six) hours as needed for moderate pain, headache or mild pain. [provider] Taking Active Self  Alcohol Swabs (B-D SINGLE USE SWABS REGULAR) PADS  [id number] Yes Use as directed. Dutch Quint B, FNP Taking Active   amLODipine (NORVASC) 10 MG tablet [id number] Yes Take 1 tablet (10 mg total) by mouth daily. Dutch Quint B, FNP Taking Active   aspirin 81 MG tablet [id number] Yes Take 81 mg by mouth daily. [provider] Taking Active Self  Blood Glucose Monitoring Suppl (TRUE METRIX AIR GLUCOSE  METER) w/Device KIT [id number] Yes 1 kit by Does not apply route 3 (three) times daily. [name], MD Taking Active Self  carvedilol (COREG) 6.25 MG tablet [id number] Yes TAKE 1 TABLET TWICE DAILY WITH MEALS [name], MD Taking Active   dapagliflozin propanediol (FARXIGA) 10 MG TABS tablet [id number] Yes Take by mouth daily. [provider] Taking Active   famotidine (PEPCID) 40 MG tablet [id number] Yes TAKE 1 TABLET BY MOUTH EVERY DAY [name], MD Taking Active   ferrous gluconate (FERGON) 324 MG tablet [id number] Yes TAKE 1 TABLET (324 MG TOTAL) BY MOUTH 2 (TWO) TIMES DAILY WITH A MEAL. Julian Hy, DO Taking Active Self  glipiZIDE (GLUCOTROL XL) 5 MG 24 hr tablet [id number] Yes TAKE 1 TABLET EVERY DAY WITH BREAKFAST [name], MD Taking Active   glucose blood (TRUE METRIX BLOOD GLUCOSE TEST) test strip [id number] Yes Use as instructed Kennyth Arnold, FNP Taking Active   hydrALAZINE (APRESOLINE) 50 MG tablet [id number] Yes Take 1 tablet (50 mg total) by mouth 3 (three) times daily. Dutch Quint B, FNP Taking Active   levothyroxine (SYNTHROID) 100 MCG tablet [id number] Yes Take 1 tablet (100 mcg total) by mouth daily. Dutch Quint B, FNP Taking Active   losartan (COZAAR) 25 MG tablet [id number] Yes Take 1 tablet (25 mg total) by mouth daily. Dutch Quint B, FNP Taking Active   metoCLOPramide (REGLAN) 10 MG tablet [id number] Yes TAKE 1 TABLET FOUR TIMES DAILY [name], MD Taking Active   NOVOLIN 70/30 (70-30) 100 UNIT/ML injection [id number] Yes INJECT 45 UNITS INTO THE SKIN 2 (TWO) TIMES DAILY  WITH A MEAL. [name], MD Taking Active   ondansetron [facility]) 4 MG tablet [id number] Yes Take 1 tablet (4 mg total) by mouth every 8 (eight) hours as needed for nausea or vomiting. [name], MD Taking Active   pantoprazole (PROTONIX) 40 MG tablet [id number] Yes Take 1 tablet (40 mg total) by mouth 2 (two) times daily. Dutch Quint B, FNP Taking Active   polyethylene glycol Merril Abbe / GLYCOLAX) packet [id number] Yes Take 17 g by mouth daily as needed for moderate constipation (MIX AND DRINK). [provider] Taking Active Self  rosuvastatin (CRESTOR) 20 MG tablet [id number] Yes TAKE 1 TABLET AT BEDTIME [name], MD Taking Active   SUMAtriptan (IMITREX) 50 MG tablet [id number] Yes TAKE 1 TAB EVERY 2 HRS AS NEEDED FOR MIGRAINE. MAY REPEAT IN 2 HOURS IF HEADACHE PERSISTS OR RECURS. [name], MD Taking Active   torsemide [facility]) 20 MG tablet [id number] Yes Take 1 tablet (20 mg total) by mouth 2 (two) times daily. Kennyth Arnold, FNP Taking Active   TRUEplus Lancets 33G MISC [id number] Yes Use as directed Kennyth Arnold, FNP Taking Active             Patient Active Problem List   Diagnosis Date Noted   Hammer toe of second toe of left foot 05/05/2021   Corns and callosities 05/05/2021   Hav (hallux abducto valgus), unspecified laterality 05/05/2021   Hyperlipidemia associated with type 2 diabetes mellitus ([place]) 02/01/2021   Depression 02/01/2021   Localized osteoporosis without current pathological fracture 05/01/2020   Hyperparathyroidism ([place]) 12/13/2019   Elevated hemoglobin ([place]) 12/13/2019   Chronic renal impairment, stage 4 (severe) ([place]) 10/28/2019   Anemia 10/28/2019   Physical exam 06/26/2019   Neck pain 04/18/2019   Ascending aortic aneurysm ([place]) 04/17/2019   Hypoxia    Nonspecific chest pain  04/15/2019   Neuropathic pain of right flank 12/27/2018   Dysphagia 12/27/2018   Postsurgical hypothyroidism 06/11/2018   Vitamin D  deficiency 06/11/2018   Multinodular goiter 06/11/2018   Hypercalcemia 06/11/2018   DM type 2 causing vascular disease (HCC)    PONV (postoperative nausea and vomiting)    Peripheral neuropathy    Migraine    Irritable bowel syndrome    Hypertension    History of right mastectomy    H/O hiatal hernia    GERD (gastroesophageal reflux disease)    Family history of anesthesia complication    Diabetes mellitus type 2, uncontrolled, with complications    Lower extremity edema    Chronic constipation 06/23/2015   Morbid obesity ([place]) 06/12/2015   Chronic diastolic heart failure ([place]) 11/17/2014   Herniated nucleus pulposus, L2-3 right 02/04/2014    Class: Acute   Spondylolisthesis of lumbar region 04/30/2012    Class: Chronic   Lumbar stenosis with neurogenic claudication 04/30/2012    Class: Chronic   SYNCOPE-CAROTID SINUS 09/16/2008   OSTEOARTHRITIS 08/23/2006   DDD (degenerative disc disease), cervical 08/23/2006   DISC DISEASE, LUMBAR 08/23/2006   Headache(784.0) 08/23/2006   BREAST CANCER, HX OF 08/23/2006   HYPERPARATHYROIDISM, HX OF 08/23/2006   RENAL CALCULUS, HX OF 08/23/2006    Immunization History  Administered Date(s) Administered   Fluad Quad(high Dose 65+) 12/13/2019, 02/01/2021   Influenza Split 01/31/2011   Influenza Whole 02/23/2007   Influenza, High Dose Seasonal PF 01/10/2017   Influenza,inj,Quad PF,6+ Mos 01/07/2014, 02/09/2016, 01/01/2018, 12/03/2018   Influenza-Unspecified 01/10/2012   PFIZER Comirnaty(Gray Top)Covid-19 Tri-Sucrose Vaccine 05/24/2019, 06/18/2019, 01/11/2020, 08/15/2020   PFIZER(Purple Top)SARS-COV-2 Vaccination 05/24/2019, 06/18/2019, 01/11/2020   Pneumococcal Conjugate-13 05/16/2019   Pneumococcal Polysaccharide-23 08/25/2014   Tdap 04/20/2011    Conditions to be addressed/monitored: Hypertension, Hyperlipidemia, Diabetes, GERD, COPD, Chronic Kidney Disease and Osteoporosis  Care Plan : CCM Pharmacy Care Plan  Updates made by  Edythe Clarity, RPH since 09/22/2021 12:00 AM     Problem: Hypertension, Hyperlipidemia, Diabetes, GERD, COPD, Chronic Kidney Disease and Osteoporosis   Priority: High     Long-Range Goal: Disease management   Start Date: 08/31/2020  Expected End Date: 08/31/2021  Recent Progress: On track  Priority: High  Note:   Pharmacist Clinical Goal(s):  Patient will achieve adherence to monitoring guidelines and medication adherence to achieve therapeutic efficacy contact provider office for questions/concerns as evidenced notation of same in electronic health record through collaboration with PharmD and provider.   Interventions: 1:1 collaboration with [name], MD regarding development and update of comprehensive plan of care as evidenced by provider attestation and co-signature Inter-disciplinary care team collaboration (see longitudinal plan of care) Comprehensive medication review performed; medication list updated in electronic medical record  Diabetes (A1c goal <7%) -Uncontrolled -Current medications: Novolin 70/30 40 units BID with a meal Appropriate, Effective Glipizide XL 26m dialy Appropriate, Query effective Farxiga 179mdaily Appropriate, Query effective, -Medications previously tried: GlAnibal Henderson-Current home glucose readings fasting glucose: not checking glucose post prandial glucose: not checking glucose regularly -Denies hypoglycemic/hyperglycemic symptoms -Current exercise: minimal -Educated on A1c and blood sugar goals; Prevention and management of hypoglycemic episodes; Benefits of routine self-monitoring of blood sugar; -Counseled to check feet daily and get yearly eye exams -Recommended to continue current medication Stressed importance of medication adherence.  She does report to taking insulin but does admit to missing some doses.  Have asked her to check glucose for next week and record - will adjust based  off of reported readings.  Update  09/22/21 Has not had A1c since July 2022 where it was 9.9. Not really checking sugar often at home.  When she does, most readings are in the 200s per her report. Denies any symptoms of low blood sugar. Asked her to call and schedule a FU visit with Dr. Birdie Riddle for fasting labs. I would consider a GLP-1 if patient was willing, I can assist with cost if needed. If we did this I would also consider stopping her glipizide due to hypoglycemia risk and previous reports of feeling jittery. Will FU after A1c to assess needs.   Hypertension (BP goal <130/80) -Controlled -At times not taking torsemide when leaving the house due to increased urination. As of today is not using carvedilol or amlodipine  -Current treatment: Hydralazine 50 mg three times daily Appropriate, Query effective, Losartan 25 mg once daily Appropriate, Query effective, Torsemide 20 mg BID Appropriate, Query effective, Amlodipine 43m daily Appropriate, Query effective, Carvedilol 6.249mtwice daily Appropriate, Query effective, -Meds needing clarifcation: amlodipine, carvedilol -Current home readings: at goal -Current exercise habits: no formal exercise - limited due to chronic pain - planing on getting spinal cord stimulator  -Denies hypotensive/hypertensive symptoms -Educated on Daily salt intake goal < 2300 mg; Importance of home blood pressure monitoring; -Reviewed importance of being consistent with torsemide regimen  -Counseled to monitor BP at home 1-2x/week, document, and provide log at future appointments -Recommended to continue current medication   Update 06/09/21 Patient reports she has not been taking any of her oral medication on regular basis.  She reports a BP of systolic 20786Vne time, unclear how accurate this was.  She denies any dizziness or HA.  We spent most of the visit talking about importance of adherence.  Patient agrees to start taking her medications more regularly and agrees to me having someone  check in with her regularly to see if she is taking her medications. She gets mail order which is preferred so [facility] services were not an option. We cannot adjust BP meds if she is not taking what she is prescribed. Will have CMA call patient next week to ensure she is back on track with medications.  Update 09/22/21 Reports 128/60 last week. Denies dizziness. She is not checking BP that often. Fill history shows all meds were recently filled 08/11/21 for 90 ds supply which shows improved adherence since out last conversation. She does have increased headaches lately. Recommended she come in for physical, she is due for fasting labs and would help her to have her BP checked. No changes to meds at this time.  Osteoporosis (goal prevent fractures) -Controlled -Reports to be using consistently without issue  -Current regimen: Alendronate 70 mg once weekly -Reviewed side effects - no problems noted  -Last DEXA 03/2020 - osteoporosis  Hyperlipidemia: (LDL goal < 70) -Controlled based off of 05/2020 labs -Several cardiac risk factors  -Current treatment: Rosuvastatin 20 mg once daily (90 DS sent 4/22) -Reviewed adherence with patient - reports consistent use. Denies any recent side effects of problems -Educated on Cholesterol goals;  Benefits of statin for ASCVD risk reduction; -Recommended to continue current medication  -Per Dr CrStanford Breedt will need f/u imagining march 2023 on thoracic aortic aneursym   GERD (Goal: minimize symptoms, optimize medication safety) -Controlled -Daily ASA -Some ongoing symptoms but feels controlled well overall. -Several questions about metoclopramide and pantoprazole - reviewed at length with patient, all questions answered. -Current treatment: Pantoprazole 40 mg twice daily -  1 tablet twice daily  Metoclopramide 10 mg four times daily  -Dietary triggers reviewed at length  -No problems with current regimen per patient. No side effects  noted -Recommended continue current medications   Patient Goals/Self-Care Activities Patient will:  - target a minimum of 150 minutes of moderate intensity exercise weekly engage in dietary modifications by minimizing snacking   Follow Up Plan: Telephone follow up appointment with care management team member scheduled for: Medication Assistance: None required.  Patient affirms current coverage meets needs.           Patient's preferred pharmacy is:  [id number] [place] [id number]: [phone]: [phone] [facility] [place]Phone: [phone]: [phone] Follow Up:  Patient agrees to Care Plan and Follow-up.  Plan: Regular follow up to promote adherence from CMA  No future appointments.  CBeverly Milch PharmD Clinical Pharmacist  [facility]([phone]

## 2021-09-22 ENCOUNTER — Ambulatory Visit (INDEPENDENT_AMBULATORY_CARE_PROVIDER_SITE_OTHER): Payer: Medicare HMO | Admitting: Pharmacist

## 2021-09-22 DIAGNOSIS — E1159 Type 2 diabetes mellitus with other circulatory complications: Secondary | ICD-10-CM

## 2021-09-22 DIAGNOSIS — I10 Essential (primary) hypertension: Secondary | ICD-10-CM

## 2021-09-22 NOTE — Patient Instructions (Signed)
Visit Information   Goals Addressed               This Visit's Progress     Set Target A1c (pt-stated)   Not on track     Timeframe:  Long-Range Goal Priority:  Medium Start Date:    02/18/2020                         Expected End Date August 2023                     Barriers: Health Behaviors Knowledge   - set target A1C-maintain level of 7.0 or less  -patient will monitor cbgs more frequently in the home -patient will adhere to diabetic diet at least 75-100% of the time.   Why is this important?   Your target A1C is decided together by you and your doctor.  It is based on several things like your age and other health issues.    Notes:  04/21/20-Most recent A1C on file 8.8(Nov 2021) 06/24/2020-Most recent A1C level7.9(Feb 2022) 09/10/20-Patient due for A1C testing-last reading 7.9 in Feb.   12/10/2020-Most recent A1C 9.9(July 2022).  49/70/26-VZCHYIFOY due to duplicate care plan and goals  09/22/21 - agreed to come in for A1c            Patient Care Plan: [place] Plan     Problem Identified: Hypertension, Hyperlipidemia, Diabetes, GERD, COPD, Chronic Kidney Disease and Osteoporosis   Priority: High     Long-Range Goal: Disease management   Start Date: 08/31/2020  Expected End Date: 08/31/2021  Recent Progress: On track  Priority: High  Note:   Pharmacist Clinical Goal(s):  Patient will achieve adherence to monitoring guidelines and medication adherence to achieve therapeutic efficacy contact provider office for questions/concerns as evidenced notation of same in electronic health record through collaboration with PharmD and provider.   Interventions: 1:1 collaboration with [name], MD regarding development and update of comprehensive plan of care as evidenced by provider attestation and co-signature Inter-disciplinary care team collaboration (see longitudinal plan of care) Comprehensive medication review performed; medication list  updated in electronic medical record  Diabetes (A1c goal <7%) -Uncontrolled -Current medications: Novolin 70/30 40 units BID with a meal Appropriate, Effective Glipizide XL '5mg'$  dialy Appropriate, Query effective Farxiga '10mg'$  daily Appropriate, Query effective, -Medications previously tried: Anibal Henderson  -Current home glucose readings fasting glucose: not checking glucose post prandial glucose: not checking glucose regularly -Denies hypoglycemic/hyperglycemic symptoms -Current exercise: minimal -Educated on A1c and blood sugar goals; Prevention and management of hypoglycemic episodes; Benefits of routine self-monitoring of blood sugar; -Counseled to check feet daily and get yearly eye exams -Recommended to continue current medication Stressed importance of medication adherence.  She does report to taking insulin but does admit to missing some doses.  Have asked her to check glucose for next week and record - will adjust based off of reported readings.  Update 09/22/21 Has not had A1c since July 2022 where it was 9.9. Not really checking sugar often at home.  When she does, most readings are in the 200s per her report. Denies any symptoms of low blood sugar. Asked her to call and schedule a FU visit with Dr. Birdie Riddle for fasting labs. I would consider a GLP-1 if patient was willing, I can assist with cost if needed. If we did this I would also consider stopping her glipizide due to hypoglycemia risk and previous reports of  feeling jittery. Will FU after A1c to assess needs.   Hypertension (BP goal <130/80) -Controlled -At times not taking torsemide when leaving the house due to increased urination. As of today is not using carvedilol or amlodipine  -Current treatment: Hydralazine 50 mg three times daily Appropriate, Query effective, Losartan 25 mg once daily Appropriate, Query effective, Torsemide 20 mg BID Appropriate, Query effective, Amlodipine '10mg'$  daily Appropriate,  Query effective, Carvedilol 6.'26mg'$  twice daily Appropriate, Query effective, -Meds needing clarifcation: amlodipine, carvedilol -Current home readings: at goal -Current exercise habits: no formal exercise - limited due to chronic pain - planing on getting spinal cord stimulator  -Denies hypotensive/hypertensive symptoms -Educated on Daily salt intake goal < 2300 mg; Importance of home blood pressure monitoring; -Reviewed importance of being consistent with torsemide regimen  -Counseled to monitor BP at home 1-2x/week, document, and provide log at future appointments -Recommended to continue current medication   Update 06/09/21 Patient reports she has not been taking any of her oral medication on regular basis.  She reports a BP of systolic 275T one time, unclear how accurate this was.  She denies any dizziness or HA.  We spent most of the visit talking about importance of adherence.  Patient agrees to start taking her medications more regularly and agrees to me having someone check in with her regularly to see if she is taking her medications. She gets mail order which is preferred so [facility] services were not an option. We cannot adjust BP meds if she is not taking what she is prescribed. Will have CMA call patient next week to ensure she is back on track with medications.  Update 09/22/21 Reports 128/60 last week. Denies dizziness. She is not checking BP that often. Fill history shows all meds were recently filled 08/11/21 for 90 ds supply which shows improved adherence since out last conversation. She does have increased headaches lately. Recommended she come in for physical, she is due for fasting labs and would help her to have her BP checked. No changes to meds at this time.  Osteoporosis (goal prevent fractures) -Controlled -Reports to be using consistently without issue  -Current regimen: Alendronate 70 mg once weekly -Reviewed side effects - no problems noted  -Last DEXA  03/2020 - osteoporosis  Hyperlipidemia: (LDL goal < 70) -Controlled based off of 05/2020 labs -Several cardiac risk factors  -Current treatment: Rosuvastatin 20 mg once daily (90 DS sent 4/22) -Reviewed adherence with patient - reports consistent use. Denies any recent side effects of problems -Educated on Cholesterol goals;  Benefits of statin for ASCVD risk reduction; -Recommended to continue current medication  -Per Dr Stanford Breed pt will need f/u imagining march 2023 on thoracic aortic aneursym   GERD (Goal: minimize symptoms, optimize medication safety) -Controlled -Daily ASA -Some ongoing symptoms but feels controlled well overall. -Several questions about metoclopramide and pantoprazole - reviewed at length with patient, all questions answered. -Current treatment: Pantoprazole 40 mg twice daily - 1 tablet twice daily  Metoclopramide 10 mg four times daily  -Dietary triggers reviewed at length  -No problems with current regimen per patient. No side effects noted -Recommended continue current medications   Patient Goals/Self-Care Activities Patient will:  - target a minimum of 150 minutes of moderate intensity exercise weekly engage in dietary modifications by minimizing snacking   Follow Up Plan: Telephone follow up appointment with care management team member scheduled for: Medication Assistance: None required.  Patient affirms current coverage meets needs.        The  patient verbalized understanding of instructions, educational materials, and care plan provided today and DECLINED offer to receive copy of patient instructions, educational materials, and care plan.  Telephone follow up appointment with pharmacy team member scheduled for: 3 months  Edythe Clarity, [place], PharmD Clinical Pharmacist  [facility] [phone]

## 2021-09-23 ENCOUNTER — Telehealth: Payer: Self-pay | Admitting: Family Medicine

## 2021-09-23 NOTE — Telephone Encounter (Signed)
Left pt a vm to call office back

## 2021-09-23 NOTE — Telephone Encounter (Signed)
Patient called and stated that she has been having some issues with her sugar. Last night she woke up, not feeling right and she checked her sugar and it was 57. She ate a spoonful of peanut butter and went to lay back down. About an hour or so later she woke back up again not feeling right and checked it again, stated that her meter read over 900. She then took 50 units of insulin, instead of her regular 40 units, and it came down to 147. She states she is still not feeling right, very shaky and feeling cold. Told her it would be smart to get her in, nothing available until Wednesday. I did put her on the schedule and let her know that I would send a message back in the meantime to see what Dr Birdie Riddle may advise

## 2021-09-23 NOTE — Telephone Encounter (Signed)
Pt was previously seeing Endocrinology but it doesn't appear that she has been seen in quite some time.  I am not sure if there is anyone covering Dr Liliane Channel patient panel since he switched his focus to lifestyle medication but I would encourage her to call the office and see if there is someone who can see her or provide advice on her sugars.  Ok to use sameday appt on Monday but I worry that is not soon enough given how she is feeling.  If she continues to feel weak, shaky, cold, or otherwise unwell- I would recommend ER evaluation.  But I would definitely try and call Endo first

## 2021-09-23 NOTE — Telephone Encounter (Signed)
Spoke w/ pt and advised of her of Dr Birdie Riddle message . I advised her to call Endo tomorrow morning . She stated she would

## 2021-09-28 ENCOUNTER — Telehealth: Payer: Self-pay | Admitting: Nurse Practitioner

## 2021-09-28 DIAGNOSIS — E890 Postprocedural hypothyroidism: Secondary | ICD-10-CM

## 2021-09-28 DIAGNOSIS — E1159 Type 2 diabetes mellitus with other circulatory complications: Secondary | ICD-10-CM

## 2021-09-28 NOTE — Telephone Encounter (Signed)
Pt has not been seen since July, she has canceled her other appt, she sch one for this July 25th and states her sugar is dropping in the morning and rising at night, she was unable to give me her readings at the time of call. Does she need blood work before she comes back in July or do you just want readings at that appt

## 2021-09-28 NOTE — Telephone Encounter (Signed)
I just ordered some labs for her

## 2021-09-28 NOTE — Telephone Encounter (Signed)
Pt states she will do them this week when she completes labs for her Kidney specialist.

## 2021-09-29 ENCOUNTER — Ambulatory Visit: Payer: Medicare HMO | Admitting: Family Medicine

## 2021-10-08 DIAGNOSIS — E785 Hyperlipidemia, unspecified: Secondary | ICD-10-CM | POA: Diagnosis not present

## 2021-10-08 DIAGNOSIS — M810 Age-related osteoporosis without current pathological fracture: Secondary | ICD-10-CM | POA: Diagnosis not present

## 2021-10-08 DIAGNOSIS — E1159 Type 2 diabetes mellitus with other circulatory complications: Secondary | ICD-10-CM | POA: Diagnosis not present

## 2021-10-08 DIAGNOSIS — Z794 Long term (current) use of insulin: Secondary | ICD-10-CM | POA: Diagnosis not present

## 2021-10-08 DIAGNOSIS — I10 Essential (primary) hypertension: Secondary | ICD-10-CM | POA: Diagnosis not present

## 2021-10-13 DIAGNOSIS — E890 Postprocedural hypothyroidism: Secondary | ICD-10-CM | POA: Diagnosis not present

## 2021-10-13 DIAGNOSIS — E1159 Type 2 diabetes mellitus with other circulatory complications: Secondary | ICD-10-CM | POA: Diagnosis not present

## 2021-10-13 DIAGNOSIS — N1832 Chronic kidney disease, stage 3b: Secondary | ICD-10-CM | POA: Diagnosis not present

## 2021-10-14 ENCOUNTER — Telehealth: Payer: Self-pay | Admitting: Pharmacist

## 2021-10-14 LAB — COMPREHENSIVE METABOLIC PANEL
ALT: 10 IU/L (ref 0–32)
AST: 15 IU/L (ref 0–40)
Albumin/Globulin Ratio: 1.3 (ref 1.2–2.2)
Albumin: 4.2 g/dL (ref 3.7–4.7)
Alkaline Phosphatase: 67 IU/L (ref 44–121)
BUN/Creatinine Ratio: 15 (ref 12–28)
BUN: 45 mg/dL — ABNORMAL HIGH (ref 8–27)
Bilirubin Total: 0.2 mg/dL (ref 0.0–1.2)
CO2: 20 mmol/L (ref 20–29)
Calcium: 10.2 mg/dL (ref 8.7–10.3)
Chloride: 100 mmol/L (ref 96–106)
Creatinine, Ser: 2.98 mg/dL — ABNORMAL HIGH (ref 0.57–1.00)
Globulin, Total: 3.3 g/dL (ref 1.5–4.5)
Glucose: 288 mg/dL — ABNORMAL HIGH (ref 70–99)
Potassium: 4.4 mmol/L (ref 3.5–5.2)
Sodium: 137 mmol/L (ref 134–144)
Total Protein: 7.5 g/dL (ref 6.0–8.5)
eGFR: 16 mL/min/{1.73_m2} — ABNORMAL LOW (ref >59)

## 2021-10-14 LAB — T4, FREE: Free T4: 1.36 ng/dL (ref 0.82–1.77)

## 2021-10-14 LAB — TSH: TSH: 0.692 u[IU]/mL (ref 0.450–4.500)

## 2021-10-14 NOTE — Progress Notes (Signed)
Chronic Care Management Pharmacy Assistant   Name: [name]  MRN: [id number] DOB: 1950-04-24   Reason for Encounter: Disease State - General  Adherence Call     Recent office visits:  None noted.   Recent consult visits:  None noted.   Hospital visits: 06/20/21-06/21/21 Medication Reconciliation was completed by comparing discharge summary, patient's EMR and Pharmacy list, and upon discussion with patient.   Admitted to the hospital on 06/20/21 due to Hyperglycemia. Discharge date was 06/21/21. Discharged from [facility].   New?Medications Started at [facility] Discharge:?? None noted.    Medication Changes at Hospital Discharge: None noted.    Medications Discontinued at Hospital Discharge: None noted.    Medications that remain the same after Hospital Discharge:??  All other medications will remain the same  Medications: Outpatient Encounter Medications as of 10/14/2021  Medication Sig   acetaminophen (TYLENOL) 500 MG tablet Take 500-1,000 mg by mouth every 6 (six) hours as needed for moderate pain, headache or mild pain.   Alcohol Swabs (B-D SINGLE USE SWABS REGULAR) PADS Use as directed.   amLODipine (NORVASC) 10 MG tablet Take 1 tablet (10 mg total) by mouth daily.   aspirin 81 MG tablet Take 81 mg by mouth daily.   Blood Glucose Monitoring Suppl (TRUE METRIX AIR GLUCOSE METER) w/Device KIT 1 kit by Does not apply route 3 (three) times daily.   carvedilol (COREG) 6.25 MG tablet TAKE 1 TABLET TWICE DAILY WITH MEALS   dapagliflozin propanediol (FARXIGA) 10 MG TABS tablet Take by mouth daily.   famotidine (PEPCID) 40 MG tablet TAKE 1 TABLET BY MOUTH EVERY DAY   ferrous gluconate (FERGON) 324 MG tablet TAKE 1 TABLET (324 MG TOTAL) BY MOUTH 2 (TWO) TIMES DAILY WITH A MEAL.   glipiZIDE (GLUCOTROL XL) 5 MG 24 hr tablet TAKE 1 TABLET EVERY DAY WITH BREAKFAST   glucose blood (TRUE METRIX BLOOD GLUCOSE TEST) test strip Use as instructed   hydrALAZINE  (APRESOLINE) 50 MG tablet Take 1 tablet (50 mg total) by mouth 3 (three) times daily.   levothyroxine (SYNTHROID) 100 MCG tablet Take 1 tablet (100 mcg total) by mouth daily.   losartan (COZAAR) 25 MG tablet Take 1 tablet (25 mg total) by mouth daily.   metoCLOPramide (REGLAN) 10 MG tablet TAKE 1 TABLET FOUR TIMES DAILY   NOVOLIN 70/30 (70-30) 100 UNIT/ML injection INJECT 45 UNITS INTO THE SKIN 2 (TWO) TIMES DAILY WITH A MEAL.   ondansetron (ZOFRAN) 4 MG tablet Take 1 tablet (4 mg total) by mouth every 8 (eight) hours as needed for nausea or vomiting.   pantoprazole (PROTONIX) 40 MG tablet Take 1 tablet (40 mg total) by mouth 2 (two) times daily.   polyethylene glycol (MIRALAX / GLYCOLAX) packet Take 17 g by mouth daily as needed for moderate constipation (MIX AND DRINK).   rosuvastatin (CRESTOR) 20 MG tablet TAKE 1 TABLET AT BEDTIME   SUMAtriptan (IMITREX) 50 MG tablet TAKE 1 TAB EVERY 2 HRS AS NEEDED FOR MIGRAINE. MAY REPEAT IN 2 HOURS IF HEADACHE PERSISTS OR RECURS.   torsemide (DEMADEX) 20 MG tablet Take 1 tablet (20 mg total) by mouth 2 (two) times daily.   TRUEplus Lancets 33G MISC Use as directed   No facility-administered encounter medications on file as of 10/14/2021.    Contacted [name] for General Review Call   Chart Review:  Have there been any documented new, changed, or discontinued medications since last visit? No (If yes, include name, dose, frequency,  date) Has there been any documented recent hospitalizations or ED visits since last visit with Clinical Pharmacist? Yes Brief Summary (including medication and/or Diagnosis changes): ED visit on 06/20/21  Adherence Review:  Does the Clinical Pharmacist Assistant have access to adherence rates? Yes Adherence rates for STAR metric medications (List medication(s)/day supply/ last 2 fill dates). Adherence rates for medications indicated for disease state being reviewed (List medication(s)/day supply/ last 2 fill  dates). Does the patient have >5 day gap between last estimated fill dates for any of the above medications or other medication gaps? No Reason for medication gaps.   Disease State Questions:  Able to connect with Patient? Yes Did patient have any problems with their health recently? No Note problems and Concerns: Have you had any admissions or emergency room visits or worsening of your condition(s) since last visit? Yes Details of ED visit, hospital visit and/or worsening condition(s): 3/12 - 06/21/21 Have you had any visits with new specialists or providers since your last visit? No Explain: Have you had any new health care problem(s) since your last visit? No New problem(s) reported: Have you run out of any of your medications since you last spoke with clinical pharmacist? No What caused you to run out of your medications? Are there any medications you are not taking as prescribed? No What kept you from taking your medications as prescribed? Are you having any issues or side effects with your medications? No Note of issues or side effects: Do you have any other health concerns or questions you want to discuss with your Clinical Pharmacist before your next visit? No Note additional concerns and questions from Patient. Are there any health concerns that you feel we can do a better job addressing? No Note Patient's response. Are you having any problems with any of the following since the last visit: (select all that apply)  None  Details: 12. Any falls since last visit? No  Details: 13. Any increased or uncontrolled pain since last visit? No  Details: 14. Next visit Type: office       Visit with: Labs        Date:11/02/21        Time:11:00 am  15. Additional Details? No   Care Gaps   AWV: done 06/25/20 Colonoscopy: overdue 06/09/13 upper EGD done 08/09/19 DM Eye Exam:  done 04/20/21 DM Foot Exam: done 11/04/20 Microalbumin: overdue HbgAIC: done 10/28/20 (6.9) due 04/30/21 DEXA:  done 03/25/20 Mammogram: done 08/25/20     Star Rating Drugs: rosuvastatin (CRESTOR) 20 MG tablet - last filled 08/11/21 90 days (uses mail order) losartan (COZAAR) 25 MG tablet - last filled 08/11/21 90 days (uses mail order) glipiZIDE (GLUCOTROL XL) 5 MG 24 hr tablet - last filled 08/11/21 90 day (uses mail order)    Future Appointments  Date Time Provider [place]  11/02/2021 11:00 AM Brita Romp, NP [facility] None  12/29/2021  2:00 PM [facility] CCM PHARMACIST [facility] [place], [place] Clinical Pharmacist Assistant  [phone]

## 2021-11-02 ENCOUNTER — Encounter: Payer: Medicare HMO | Admitting: Nurse Practitioner

## 2021-11-02 VITALS — Ht 64.0 in

## 2021-11-02 NOTE — Patient Instructions (Signed)

## 2021-11-03 NOTE — Progress Notes (Signed)
Erroneous encounter

## 2021-11-08 ENCOUNTER — Encounter: Payer: Self-pay | Admitting: Nurse Practitioner

## 2021-11-08 ENCOUNTER — Ambulatory Visit: Payer: Medicare HMO | Admitting: Nurse Practitioner

## 2021-11-08 VITALS — BP 108/71 | HR 62 | Ht 64.0 in | Wt 196.0 lb

## 2021-11-08 DIAGNOSIS — Z789 Other specified health status: Secondary | ICD-10-CM

## 2021-11-08 DIAGNOSIS — I10 Essential (primary) hypertension: Secondary | ICD-10-CM | POA: Diagnosis not present

## 2021-11-08 DIAGNOSIS — E042 Nontoxic multinodular goiter: Secondary | ICD-10-CM

## 2021-11-08 DIAGNOSIS — E1159 Type 2 diabetes mellitus with other circulatory complications: Secondary | ICD-10-CM

## 2021-11-08 DIAGNOSIS — E890 Postprocedural hypothyroidism: Secondary | ICD-10-CM | POA: Diagnosis not present

## 2021-11-08 DIAGNOSIS — E559 Vitamin D deficiency, unspecified: Secondary | ICD-10-CM

## 2021-11-08 NOTE — Patient Instructions (Signed)

## 2021-11-08 NOTE — Progress Notes (Signed)
11/08/2021, 12:27 PM   Endocrinology follow-up note   Subjective:    Patient ID: [name], female    DOB: [birth year].  JASENIA WEILBACHER is being seen in follow-up for management of currently uncontrolled,complicated symptomatic type 2 diabetes and associated hypertension, hypothyroidism. PMD :  [name], MD.   Past Medical History:  Diagnosis Date   Anginal pain ([place]) 03/19/2012   saw Dr. Cathie Olden .. she thinks its more related to stomach issues   Arthritis    "all over; mostly in my back" (03/20/2012)   Breast cancer ([place])    "right" (03/20/2012)   Cardiomyopathy    Chest pain 06/2005   Hospitalized, dystolic dysfunction   Chronic lower back pain    "have 2 herniated discs; going to have to have a fusion" (03/20/2012)   CKD (chronic kidney disease), stage III ([place])    Family history of anesthesia complication    "father has nausea and vomiting"   Gallstones    GERD (gastroesophageal reflux disease)    H/O hiatal hernia    History of kidney stones    History of right mastectomy    Hypertension    "patient states never had HTN, takes Hyzaar for heart   Hypothyroidism    Irritable bowel syndrome    Migraines    "it's been a long time since I've had one" (03/20/2012)   Osteoarthritis    Peripheral neuropathy    PONV (postoperative nausea and vomiting)    Sciatica    Type II diabetes mellitus ([place])    Past Surgical History:  Procedure Laterality Date   ABDOMINAL HYSTERECTOMY  1993   adenosin cardiolite  07/2005   (+) wall motion abnormality   AXILLARY LYMPH NODE DISSECTION  2003   squamous cell cancer   BACK SURGERY     CARDIAC CATHETERIZATION  ? 2005 / 2007   CHOLECYSTECTOMY N/A 01/24/2018   Procedure: LAPAROSCOPIC CHOLECYSTECTOMY;  Surgeon: Erroll Luna, MD;  Location: [facility];  Service: General;  Laterality: N/A;   CT abdomen and pelvis  02/2010   Same    ESOPHAGOGASTRODUODENOSCOPY  2005   GERD   KNEE ARTHROSCOPY  1990's   "right" (03/20/2012)   LUMBAR FUSION N/A 08/22/2014   Procedure: Right L2-3 and right L1-2 transforaminal lumbar interbody fusion with pedicle screws, rods, sleeves, cages, local bone graft, Vivigen, cancellous chips;  Surgeon: Jessy Oto, MD;  Location: [facility];  Service: Orthopedics;  Laterality: N/A;   LUMBAR LAMINECTOMY/DECOMPRESSION MICRODISCECTOMY N/A 02/04/2014   Procedure: RIGHT L2-3 MICRODISCECTOMY ;  Surgeon: Jessy Oto, MD;  Location: [facility];  Service: Orthopedics;  Laterality: N/A;   MASTECTOMY Right    MASTECTOMY WITH AXILLARY LYMPH NODE DISSECTION  12/2003   "right" (03/20/2012)   POSTERIOR CERVICAL FUSION/FORAMINOTOMY  2001   SHOULDER ARTHROSCOPY W/ ROTATOR CUFF REPAIR  2003   Left   THYROIDECTOMY  1977   Right   TOTAL KNEE ARTHROPLASTY  2000   Right   [place] of abdomen  07/2005   Fatty liver / kidney stones   Social History   Socioeconomic History   Marital status: Married    Spouse name: Not  on file   Number of children: 1   Years of education: Not on file   Highest education level: Not on file  Occupational History   Occupation: Retired since 2003  Tobacco Use   Smoking status: Never   Smokeless tobacco: Never  Vaping Use   Vaping Use: Never used  Substance and Sexual Activity   Alcohol use: No   Drug use: No   Sexual activity: Not on file  Other Topics Concern   Not on file  Social History Narrative   Not on file   Social Determinants of Health   Financial Resource Strain: Not on file  Food Insecurity: No Food Insecurity (09/10/2020)   Hunger Vital Sign    Worried About Running Out of Food in the Last Year: Never true    Ran Out of Food in the Last Year: Never true  Transportation Needs: No Transportation Needs (09/10/2020)   PRAPARE - Hydrologist (Medical): No    Lack of Transportation (Non-Medical): No  Physical Activity: Not on file  Stress: Not on  file  Social Connections: Not on file   Outpatient Encounter Medications as of 11/08/2021  Medication Sig   acetaminophen (TYLENOL) 500 MG tablet Take 500-1,000 mg by mouth every 6 (six) hours as needed for moderate pain, headache or mild pain.   Alcohol Swabs (B-D SINGLE USE SWABS REGULAR) PADS Use as directed.   amLODipine (NORVASC) 10 MG tablet Take 1 tablet (10 mg total) by mouth daily.   aspirin 81 MG tablet Take 81 mg by mouth daily.   Blood Glucose Monitoring Suppl (TRUE METRIX AIR GLUCOSE METER) w/Device KIT 1 kit by Does not apply route 3 (three) times daily.   carvedilol (COREG) 6.25 MG tablet TAKE 1 TABLET TWICE DAILY WITH MEALS   dapagliflozin propanediol (FARXIGA) 10 MG TABS tablet Take by mouth daily.   famotidine (PEPCID) 40 MG tablet TAKE 1 TABLET BY MOUTH EVERY DAY   ferrous gluconate (FERGON) 324 MG tablet TAKE 1 TABLET (324 MG TOTAL) BY MOUTH 2 (TWO) TIMES DAILY WITH A MEAL.   glucose blood (TRUE METRIX BLOOD GLUCOSE TEST) test strip Use as instructed   hydrALAZINE (APRESOLINE) 50 MG tablet Take 1 tablet (50 mg total) by mouth 3 (three) times daily.   levothyroxine (SYNTHROID) 100 MCG tablet Take 1 tablet (100 mcg total) by mouth daily.   losartan (COZAAR) 25 MG tablet Take 1 tablet (25 mg total) by mouth daily.   metoCLOPramide (REGLAN) 10 MG tablet TAKE 1 TABLET FOUR TIMES DAILY   NOVOLIN 70/30 (70-30) 100 UNIT/ML injection INJECT 45 UNITS INTO THE SKIN 2 (TWO) TIMES DAILY WITH A MEAL.   ondansetron (ZOFRAN) 4 MG tablet Take 1 tablet (4 mg total) by mouth every 8 (eight) hours as needed for nausea or vomiting.   pantoprazole (PROTONIX) 40 MG tablet Take 1 tablet (40 mg total) by mouth 2 (two) times daily.   polyethylene glycol (MIRALAX / GLYCOLAX) packet Take 17 g by mouth daily as needed for moderate constipation (MIX AND DRINK).   rosuvastatin (CRESTOR) 20 MG tablet TAKE 1 TABLET AT BEDTIME   SUMAtriptan (IMITREX) 50 MG tablet TAKE 1 TAB EVERY 2 HRS AS NEEDED FOR  MIGRAINE. MAY REPEAT IN 2 HOURS IF HEADACHE PERSISTS OR RECURS.   torsemide (DEMADEX) 20 MG tablet Take 1 tablet (20 mg total) by mouth 2 (two) times daily.   TRUEplus Lancets 33G MISC Use as directed   [DISCONTINUED] glipiZIDE (GLUCOTROL XL) 5  MG 24 hr tablet TAKE 1 TABLET EVERY DAY WITH BREAKFAST   No facility-administered encounter medications on file as of 11/08/2021.    ALLERGIES: Allergies  Allergen Reactions   Dilaudid [Hydromorphone Hcl] Nausea And Vomiting   Hydromorphone Nausea And Vomiting   Lipitor [Atorvastatin] Other (See Comments)    Body & Muscle Aches   Adhesive [Tape] Rash   Ampicillin Rash and Other (See Comments)    Also developed welts on her hands when she took it in the hospital   Latex Itching, Dermatitis, Rash and Other (See Comments)    Patient had a reaction to tape after surgery and they told her she was allergic to Latex    Penicillins Rash and Other (See Comments)    Has patient had a PCN reaction causing immediate rash, facial/tongue/throat swelling, SOB or lightheadedness with hypotension: No Has patient had a PCN reaction causing severe rash involving mucus membranes or skin necrosis: No Has patient had a PCN reaction that required hospitalization: Yes - in hospital receiving treatment Has patient had a PCN reaction occurring within the last 10 years: No If all of the above answers are "NO", then may proceed with Cephalosporin use.     VACCINATION STATUS: Immunization History  Administered Date(s) Administered   Fluad Quad(high Dose 65+) 12/13/2019, 02/01/2021   Influenza Split 01/31/2011   Influenza Whole 02/23/2007   Influenza, High Dose Seasonal PF 01/10/2017   Influenza,inj,Quad PF,6+ Mos 01/07/2014, 02/09/2016, 01/01/2018, 12/03/2018   Influenza-Unspecified 01/10/2012   PFIZER Comirnaty(Gray Top)Covid-19 Tri-Sucrose Vaccine 05/24/2019, 06/18/2019, 01/11/2020, 08/15/2020   PFIZER(Purple Top)SARS-COV-2 Vaccination 05/24/2019, 06/18/2019,  01/11/2020   Pneumococcal Conjugate-13 05/16/2019   Pneumococcal Polysaccharide-23 08/25/2014   Tdap 04/20/2011    Diabetes She presents for her follow-up diabetic visit. She has type 2 diabetes mellitus. Onset time: She was diagnosed at approximate age of 37 years. Her disease course has been improving. Hypoglycemia symptoms include nervousness/anxiousness and sweats. Pertinent negatives for hypoglycemia include no confusion, headaches, pallor, seizures or tremors. Associated symptoms include fatigue, polydipsia, polyuria and weight loss. Pertinent negatives for diabetes include no blurred vision, no chest pain, no foot ulcerations and no polyphagia. Hypoglycemia complications include nocturnal hypoglycemia. Symptoms are improving. Diabetic complications include heart disease, nephropathy and retinopathy. Risk factors for coronary artery disease include diabetes mellitus, hypertension, obesity, sedentary lifestyle, post-menopausal and dyslipidemia. Current diabetic treatment includes insulin injections and oral agent (dual therapy) (was started on Farxiga between visits). She is compliant with treatment most of the time. Her weight is decreasing steadily. She is following a generally unhealthy diet. When asked about meal planning, she reported none. She has not had a previous visit with a dietitian. She never participates in exercise. Her home blood glucose trend is decreasing steadily. Her overall blood glucose range is 140-180 mg/dl. (She presents today with her meter and logs showing slightly above target glycemic profile overall.  Her POCT A1c today is 7.9%, improving from last visit of 9.9%.  She was started on Farxiga between visits.  She notes she is having some nocturnal hypoglycemia.  Analysis of her meter shows 7-day average of 154, 14-day average of 156, 30-day average of 166, 90-day average of 161.) An ACE inhibitor/angiotensin II receptor blocker is being taken. She does not see a podiatrist.Eye  exam is current.  Hypertension This is a chronic problem. The current episode started more than 1 year ago. The problem has been gradually improving since onset. The problem is controlled. Associated symptoms include sweats. Pertinent negatives include no blurred vision,  chest pain, headaches, palpitations or shortness of breath. Agents associated with hypertension include thyroid hormones. Risk factors for coronary artery disease include diabetes mellitus, obesity, sedentary lifestyle, dyslipidemia, family history and post-menopausal state. Past treatments include angiotensin blockers, calcium channel blockers, beta blockers, diuretics and direct vasodilators. The current treatment provides mild improvement. There are no compliance problems.  Hypertensive end-organ damage includes kidney disease, CAD/MI and retinopathy. Identifiable causes of hypertension include chronic renal disease, hyperparathyroidism and a thyroid problem. undergoing workup for hyperparathyroidism.  Thyroid Problem Presents for follow-up visit. Onset time: She underwent what appears to be total thyroidectomy in her 50s for reportedly benign goiter.  Has taken thyroid hormone ever since, currently levothyroxine 100 mcg p.o. nightly. Symptoms include anxiety, depressed mood, fatigue and weight loss. Patient reports no cold intolerance, diarrhea, heat intolerance, palpitations, tremors or weight gain. The symptoms have been stable. Past treatments include levothyroxine. Prior procedures include thyroidectomy.     Review of systems  Constitutional: + steadily decreasing body weight,  current Body mass index is 33.64 kg/m. , no fatigue, no subjective hyperthermia, no subjective hypothermia, decreased appetite with taste disturbances Eyes: no blurry vision, no xerophthalmia ENT: no sore throat, no nodules palpated in throat, no dysphagia/odynophagia, no hoarseness Cardiovascular: no chest pain, no shortness of breath, no palpitations,  + leg swelling Respiratory: no cough, no shortness of breath Gastrointestinal: no nausea/vomiting/diarrhea Musculoskeletal: c/o chronic back pain- has spinal stimulator placed Skin: no rashes, no hyperemia Neurological: no tremors, no numbness, no tingling, no dizziness Psychiatric: no depression, no anxiety    Objective:    BP 108/71   Pulse 62   Ht _0  (1.626 m)   Wt 196 lb (88.9 kg)   BMI 33.64 kg/m   Wt Readings from Last 3 Encounters:  11/08/21 196 lb (88.9 kg)  07/15/21 205 lb 3.2 oz (93.1 kg)  06/23/21 197 lb (89.4 kg)    BP Readings from Last 3 Encounters:  11/08/21 108/71  07/15/21 136/60  06/23/21 (!) 115/53    Physical Exam- Limited  Constitutional:  Body mass index is 33.64 kg/m. , not in acute distress, normal state of mind Eyes:  EOMI, no exophthalmos Neck: Supple Cardiovascular: RRR, no murmurs, rubs, or gallops, + nonpitting edema to BLE Respiratory: Adequate breathing efforts, no crackles, rales, rhonchi, or wheezing Musculoskeletal: no gross deformities, strength intact in all four extremities, no gross restriction of joint movements Skin:  no rashes, no hyperemia Neurological: no tremor with outstretched hands       Latest Ref Rng & Units 10/13/2021    3:22 PM 06/20/2021   12:13 AM 02/05/2021    9:47 AM  CMP  Glucose 70 - 99 mg/dL 288  217  239   BUN 8 - 27 mg/dL 45  49  13   Creatinine 0.57 - 1.00 mg/dL 2.98  2.37  1.43   Sodium 134 - 144 mmol/L 137  140  143   Potassium 3.5 - 5.2 mmol/L 4.4  4.3  4.3   Chloride 96 - 106 mmol/L 100  107  107   CO2 20 - 29 mmol/L _1 Calcium 8.7 - 10.3 mg/dL 10.2  10.4  10.3   Total Protein 6.0 - 8.5 g/dL 7.5   6.7   Total Bilirubin 0.0 - 1.2 mg/dL 0.2   0.4   Alkaline Phos 44 - 121 IU/L 67   58   AST 0 - 40 IU/L 15   12   ALT 0 -  32 IU/L 10   10      Diabetic Labs (most recent): Lab Results  Component Value Date   HGBA1C 9.9 (A) 10/28/2020   HGBA1C 7.9 (A) 06/08/2020   HGBA1C 8.8 (A)  03/09/2020   MICROALBUR 570.4 08/05/2019   MICROALBUR 188.7 06/01/2018   MICROALBUR 38.3 06/30/2016     Lipid Panel ( most recent) Lipid Panel     Component Value Date/Time   CHOL 162 02/05/2021 0947   TRIG 210 (H) 02/05/2021 0947   HDL 51 02/05/2021 0947   CHOLHDL 3.2 06/02/2020 0816   VLDL 42 (H) 04/17/2019 0227   LDLCALC 76 02/05/2021 0947   LDLCALC 60 06/02/2020 0816      Assessment & Plan:   1) DM type 2 causing vascular disease ([place])  - MARKITA STCHARLES has currently uncontrolled symptomatic type 2 DM since 71 years of age.  She presents today with her meter and logs showing slightly above target glycemic profile overall.  Her POCT A1c today is 7.9%, improving from last visit of 9.9%.  She was started on Farxiga between visits.  She notes she is having some nocturnal hypoglycemia.  Analysis of her meter shows 7-day average of 154, 14-day average of 156, 30-day average of 166, 90-day average of 161.  -her diabetes is complicated by retinopathy, obesity/sedentary life, and she remains at a high risk for more acute and chronic complications which include CAD, CVA, CKD, retinopathy, and neuropathy. These are all discussed in detail with her.  The following Lifestyle Medicine recommendations according to [facility]) were discussed and offered to patient and she agrees to start the journey:  A. Whole Foods, Plant-based plate comprising of fruits and vegetables, plant-based proteins, whole-grain carbohydrates was discussed in detail with the patient.   A list for source of those nutrients were also provided to the patient.  Patient will use only water or unsweetened tea for hydration. B.  The need to stay away from risky substances including alcohol, smoking; obtaining 7 to 9 hours of restorative sleep, at least 150 minutes of moderate intensity exercise weekly, the importance of healthy social connections,  and stress reduction techniques were  discussed. C.  A full color page of  Calorie density of various food groups per pound showing examples of each food groups was provided to the patient.  - Nutritional counseling repeated at each appointment due to patients tendency to fall back in to old habits.  - The patient admits there is a room for improvement in their diet and drink choices. -  Suggestion is made for the patient to avoid simple carbohydrates from their diet including Cakes, Sweet Desserts / Pastries, Ice Cream, Soda (diet and regular), Sweet Tea, Candies, Chips, Cookies, Sweet Pastries, Store Bought Juices, Alcohol in Excess of 1-2 drinks a day, Artificial Sweeteners, Coffee Creamer, and "Sugar-free" Products. This will help patient to have stable blood glucose profile and potentially avoid unintended weight gain.   - I encouraged the patient to switch to unprocessed or minimally processed complex starch and increased protein intake (animal or plant source), fruits, and vegetables.   - Patient is advised to stick to a routine mealtimes to eat 3 meals a day and avoid unnecessary snacks (to snack only to correct hypoglycemia).  - she is following with Jearld Fenton, RDN, CDE for individualized diabetes education.  - I have approached her with the following individualized plan to manage diabetes and patient agrees:   -She will continue to  benefit from her current simplified insulin treatment regimen.    -She is encouraged to stay engaged for proper monitoring and safe use of insulin (which she has struggled with in the past).  -Based on her current glycemic profile, she will tolerate decrease in her Novolin 70/30 to 30 units SQ BID with meals if glucose is above 90 and she is eating.  She can continue her Farxiga 10 mg po daily as prescribed by nephrology, but is urged to stop her Glipizide given her lows.   -She is encouraged to start monitoring blood glucose consistently 4 times per day, before meals and before bed and to  call the clinic if she has readings less than 70 or greater than 200 for 3 tests in a row.  -She could greatly benefit from CGM.  She never heard about the ones prescribed previously.  Will send in Rx for Dexcom G7 to [facility] for fulfillment.  - she is warned not to take insulin without proper monitoring per orders.  -She is not a candidate for Metformin due to CKD.    - Patient specific target  A1c;  LDL, HDL, Triglycerides, were discussed in detail.  2) BP/HTN:  Her blood pressure is controlled to target.  She says she is in quite a bit of pain today.  She is advised to continue Amlodipine 10 mg po daily, Carvedilol 6.25 mg po twice daily, Lasix 40 mg po daily as needed for swelling, Hydralazine 25 mg po TID, and Losartan 125 mg po daily. Will defer any med changes to nephrologist (who she is seeing later this month).  3) Lipids/HPL:    Her most recent lipid panel from 02/05/21 shows controlled LDL of 76.  She is advised to continue Crestor 20 mg po daily.    4)  Weight/Diet:  Her Body mass index is 33.64 kg/m.- clearly complicating her diabetes care.  She is a candidate for modest weight loss.  I discussed with her the fact that loss of 5 - 10% of her  current body weight will have the most impact on her diabetes management.  CDE Consult will be initiated . Exercise, and detailed carbohydrates information provided  -  detailed on discharge instructions.  5) Hypothyroidism-postsurgical Her previsit thyroid function tests are consistent with appropriate hormone replacement.  She is advised to continue Levothyroxine 100 mcg po daily before breakfast.     - We discussed about the correct intake of her thyroid hormone, on empty stomach at fasting, with water, separated by at least 30 minutes from breakfast and other medications,  and separated by more than 4 hours from calcium, iron, multivitamins, acid reflux medications (PPIs). -Patient is made aware of the fact that thyroid hormone  replacement is needed for life, dose to be adjusted by periodic monitoring of thyroid function tests.  6) Multinodular goiter. -Her most recent thyroid/neck ultrasound from 09/12/18 revealed 2.9 cm nodule on the left lobe of her thyroid -She has surgically absent right thyroid lobe.  Biopsy of 2.9 cm nodule on the left lobe is benign.    She will not need any surgical intervention at this time.    7) Vitamin D deficiency -Her most recent vitamin D level on 06/02/20 was 15.  She was taken off her vitamin D supplement by her nephrologist. Will recommend she follow up with her nephrologist regarding low vitamin D levels.  8) Hypercalcemia / Hyperparathyroidism  Patient has had several instances of elevated calcium, with the highest level being at 11.4  mg/dL. A corresponding intact PTH level was also high, at 85. - Patient also has vitamin D deficiency,  with the last level being 15. - No apparent complications from hypercalcemia/hyperparathyroidism: no history of  nephrolithiasis, + osteoporosis, no fragility fractures. No abdominal pain, no major mood disorders, no bone pain.   - I discussed with the patient about the physiology of calcium and parathyroid hormone, and possible  effects of  increased PTH/ Calcium , including kidney stones, cardiac dysrhythmias, osteoporosis, abdominal pain, etc.   Based on her additional testing including repeat blood work and 24-hr urine collection, it appears she may have mild secondary hyperparathyroidism due to CKD.  Her repeat Ca and PTH levels have improved without intervention on recent labs. Her PTH-rp levels did not suggest malignancy, her 24-hr urine did not suggest [place].    9) Chronic Care/Health Maintenance: -she is on ARB and is encouraged to initiate and continue to follow up with Ophthalmology, Dentist,  Podiatrist at least yearly or according to recommendations, and advised to  stay away from smoking. I have recommended yearly flu vaccine and pneumonia  vaccine at least every 5 years; moderate intensity exercise for up to 150 minutes weekly; and  sleep for at least 7 hours a day.  - I advised patient to maintain close follow up with [name], MD for primary care needs.     I spent 40 minutes in the care of the patient today including review of labs from [place], Lipids, Thyroid Function, Hematology (current and previous including abstractions from other facilities); face-to-face time discussing  her blood glucose readings/logs, discussing hypoglycemia and hyperglycemia episodes and symptoms, medications doses, her options of short and long term treatment based on the latest standards of care / guidelines;  discussion about incorporating lifestyle medicine;  and documenting the encounter. Risk reduction counseling performed per USPSTF guidelines to reduce obesity and cardiovascular risk factors.     Please refer to Patient Instructions for Blood Glucose Monitoring and Insulin/Medications Dosing Guide"  in media tab for additional information. Please  also refer to " Patient Self Inventory" in the Media  tab for reviewed elements of pertinent patient history.  [name] participated in the discussions, expressed understanding, and voiced agreement with the above plans.  All questions were answered to her satisfaction. she is encouraged to contact clinic should she have any questions or concerns prior to her return visit.   Follow up plan: - Return in about 3 months (around 02/08/2022) for Diabetes F/U with A1c in office, No previsit labs, Bring meter and logs.  [name], [facility] [address] [place], [place] [address] Phone: [phone] Fax: [phone]  11/08/2021, 12:27 PM

## 2021-11-09 ENCOUNTER — Other Ambulatory Visit: Payer: Self-pay | Admitting: *Deleted

## 2021-11-09 NOTE — Patient Outreach (Signed)
[facility]) Care Management  11/09/2021  [name] 1950-12-22 [id number]    Care Management   Outreach Note  11/09/2021 Name: [name] MRN: [id number] DOB: Apr 13, 1950  Unsuccessful outreach attempt today (outreach #1)  Follow Up Plan:  A HIPAA compliant phone message was left for the patient providing contact information and requesting a return call.  The care management team will reach out to the patient again over the next few days.    Raina Mina, RN Care Management Coordinator [place] Office [phone]

## 2021-11-12 ENCOUNTER — Other Ambulatory Visit: Payer: Self-pay | Admitting: *Deleted

## 2021-11-12 NOTE — Patient Outreach (Signed)
[facility]) Care Management  11/12/2021  [name] [birth year] [id number]    Care Management   Outreach Note  11/12/2021 Name: [name] MRN: [id number] DOB: 1950/05/12  Unsuccessful Outreach #2  Follow Up Plan:  A HIPAA compliant phone message was left for the patient providing contact information and requesting a return call.  The care management team will reach out to the patient again over the next 7 days.   Raina Mina, RN Care Management Coordinator [place] Office [phone]

## 2021-11-17 ENCOUNTER — Other Ambulatory Visit: Payer: Self-pay | Admitting: *Deleted

## 2021-11-17 ENCOUNTER — Encounter: Payer: Self-pay | Admitting: *Deleted

## 2021-11-17 NOTE — Patient Instructions (Signed)
Visit Information  Thank you for taking time to visit with me today. Please don't hesitate to contact me if I can be of assistance to you.   Following are the goals we discussed today:   Goals Addressed               This Visit's Progress     No Needs (pt-stated)        Care Coordination Interventions: Reviewed medications with patient and discussed purpose on all medications Reviewed scheduled/upcoming provider appointments including all pending appointments and requested pt will call PCP office to scheduled AWV for this year. Screening for signs and symptoms of depression related to chronic disease state  Assessed social determinant of health barriers         Please call the care guide team at [phone] if you need to cancel or reschedule your appointment.   If you are experiencing a Mental Health or [place] or need someone to talk to, please call the Suicide and Crisis Lifeline: 988  Patient verbalizes understanding of instructions and care plan provided today and agrees to view in [place]. Active MyChart status and patient understanding of how to access instructions and care plan via MyChart confirmed with patient.     No further follow up required: No reported needs at this time. Raina Mina, RN Care Management Coordinator [place] Office [phone]

## 2021-11-17 NOTE — Patient Outreach (Signed)
  Care Coordination   Initial Visit Note   11/17/2021 Name: [name] MRN: [id number] DOB: Oct 15, 1950  [name] is a 71 y.o. year old female who sees Tabori, Aundra Millet, MD for primary care. I spoke with  [name] by phone today  What matters to the patients health and wellness today?  No needs    Goals Addressed               This Visit's Progress     No Needs (pt-stated)        Care Coordination Interventions: Reviewed medications with patient and discussed purpose on all medications Reviewed scheduled/upcoming provider appointments including all pending appointments and requested pt will call PCP office to scheduled AWV for this year. Screening for signs and symptoms of depression related to chronic disease state  Assessed social determinant of health barriers        SDOH assessments and interventions completed:  Yes  SDOH Interventions Today    Flowsheet Row Most Recent Value  SDOH Interventions   Food Insecurity Interventions Intervention Not Indicated  Housing Interventions Intervention Not Indicated  Transportation Interventions Intervention Not Indicated        Care Coordination Interventions Activated:  Yes  Care Coordination Interventions:  Yes, provided   Follow up plan: No further intervention required.   Encounter Outcome:  Pt. Visit Completed   Raina Mina, RN Care Management Coordinator [place] Office [phone]

## 2021-11-30 DIAGNOSIS — N1832 Chronic kidney disease, stage 3b: Secondary | ICD-10-CM | POA: Diagnosis not present

## 2021-11-30 DIAGNOSIS — R809 Proteinuria, unspecified: Secondary | ICD-10-CM | POA: Diagnosis not present

## 2021-11-30 DIAGNOSIS — N179 Acute kidney failure, unspecified: Secondary | ICD-10-CM | POA: Diagnosis not present

## 2021-11-30 DIAGNOSIS — I272 Pulmonary hypertension, unspecified: Secondary | ICD-10-CM | POA: Diagnosis not present

## 2021-11-30 DIAGNOSIS — I129 Hypertensive chronic kidney disease with stage 1 through stage 4 chronic kidney disease, or unspecified chronic kidney disease: Secondary | ICD-10-CM | POA: Diagnosis not present

## 2021-11-30 DIAGNOSIS — E1122 Type 2 diabetes mellitus with diabetic chronic kidney disease: Secondary | ICD-10-CM | POA: Diagnosis not present

## 2021-11-30 DIAGNOSIS — R803 Bence Jones proteinuria: Secondary | ICD-10-CM | POA: Diagnosis not present

## 2021-11-30 DIAGNOSIS — E872 Acidosis, unspecified: Secondary | ICD-10-CM | POA: Diagnosis not present

## 2021-12-10 ENCOUNTER — Telehealth: Payer: Self-pay | Admitting: Pharmacist

## 2021-12-10 NOTE — Progress Notes (Signed)
Chronic Care Management Pharmacy Assistant   Name: [name]  MRN: [id number] DOB: June 17, 1950   Reason for Encounter: Disease State - Diabetes Call     Recent office visits:  None noted.    Recent consult visits:  None noted.    Hospital visits: 06/20/21-06/21/21 Medication Reconciliation was completed by comparing discharge summary, patient's EMR and Pharmacy list, and upon discussion with patient.   Admitted to the hospital on 06/20/21 due to Hyperglycemia. Discharge date was 06/21/21. Discharged from [facility].   New?Medications Started at [facility] Discharge:?? None noted.    Medication Changes at Hospital Discharge: None noted.    Medications Discontinued at Hospital Discharge: None noted.    Medications that remain the same after Hospital Discharge:??  All other medications will remain the same   Medications: Outpatient Encounter Medications as of 12/10/2021  Medication Sig   acetaminophen (TYLENOL) 500 MG tablet Take 500-1,000 mg by mouth every 6 (six) hours as needed for moderate pain, headache or mild pain.   Alcohol Swabs (B-D SINGLE USE SWABS REGULAR) PADS Use as directed.   amLODipine (NORVASC) 10 MG tablet Take 1 tablet (10 mg total) by mouth daily.   aspirin 81 MG tablet Take 81 mg by mouth daily.   Blood Glucose Monitoring Suppl (TRUE METRIX AIR GLUCOSE METER) w/Device KIT 1 kit by Does not apply route 3 (three) times daily.   carvedilol (COREG) 6.25 MG tablet TAKE 1 TABLET TWICE DAILY WITH MEALS   dapagliflozin propanediol (FARXIGA) 10 MG TABS tablet Take by mouth daily.   famotidine (PEPCID) 40 MG tablet TAKE 1 TABLET BY MOUTH EVERY DAY   ferrous gluconate (FERGON) 324 MG tablet TAKE 1 TABLET (324 MG TOTAL) BY MOUTH 2 (TWO) TIMES DAILY WITH A MEAL.   glucose blood (TRUE METRIX BLOOD GLUCOSE TEST) test strip Use as instructed   hydrALAZINE (APRESOLINE) 50 MG tablet Take 1 tablet (50 mg total) by mouth 3 (three) times daily.    levothyroxine (SYNTHROID) 100 MCG tablet Take 1 tablet (100 mcg total) by mouth daily.   losartan (COZAAR) 25 MG tablet Take 1 tablet (25 mg total) by mouth daily.   metoCLOPramide (REGLAN) 10 MG tablet TAKE 1 TABLET FOUR TIMES DAILY   NOVOLIN 70/30 (70-30) 100 UNIT/ML injection INJECT 45 UNITS INTO THE SKIN 2 (TWO) TIMES DAILY WITH A MEAL.   ondansetron (ZOFRAN) 4 MG tablet Take 1 tablet (4 mg total) by mouth every 8 (eight) hours as needed for nausea or vomiting.   pantoprazole (PROTONIX) 40 MG tablet Take 1 tablet (40 mg total) by mouth 2 (two) times daily.   polyethylene glycol (MIRALAX / GLYCOLAX) packet Take 17 g by mouth daily as needed for moderate constipation (MIX AND DRINK).   rosuvastatin (CRESTOR) 20 MG tablet TAKE 1 TABLET AT BEDTIME   SUMAtriptan (IMITREX) 50 MG tablet TAKE 1 TAB EVERY 2 HRS AS NEEDED FOR MIGRAINE. MAY REPEAT IN 2 HOURS IF HEADACHE PERSISTS OR RECURS.   torsemide (DEMADEX) 20 MG tablet Take 1 tablet (20 mg total) by mouth 2 (two) times daily.   TRUEplus Lancets 33G MISC Use as directed   No facility-administered encounter medications on file as of 12/10/2021.   Current antihyperglycemic regimen:  NOVOLIN 70/30 (70-30) 100 UNIT/ML injection dapagliflozin propanediol (FARXIGA) 10 MG TABS tablet  What recent interventions/DTPs have been made to improve glycemic control:  Patient denied any medication changes since last visit with [name]  Have there been any recent hospitalizations or ED visits since  last visit with [name]?  Patient had ED visit for Hyperglycemia on 06/20/21-06/21/21  Patient  hypoglycemic symptoms, including    Patient  hyperglycemic symptoms, including    How often are you checking your blood sugar? Patient reported checking blood sugars   What are your blood sugars ranging?  Fasting:  Before meals:  After meals:  Bedtime:   During the week, how often does your blood glucose drop below 70?  Patient reported   Are you checking your feet  daily/regularly? Patient reported checking feet regularly     Adherence Review: Is the patient currently on a STATIN medication? Yes Is the patient currently on ACE/ARB medication? Yes Does the patient have >5 day gap between last estimated fill dates? No   Care Gaps   AWV: done 06/25/20 Colonoscopy: overdue 06/09/13 upper EGD done 08/09/19 DM Eye Exam:  done 04/20/21 DM Foot Exam: done 11/04/20 Microalbumin: overdue HbgAIC: done 10/28/20 (6.9) due 04/30/21 DEXA: done 03/25/20 Mammogram: done 08/25/20     Star Rating Drugs: rosuvastatin (CRESTOR) 20 MG tablet - last filled 08/11/21 90 days (uses mail order) losartan (COZAAR) 25 MG tablet - last filled 08/11/21 90 days (uses mail order) glipiZIDE (GLUCOTROL XL) 5 MG 24 hr tablet - last filled 08/11/21 90 day (uses mail order)  Future Appointments  Date Time Provider [place]  12/29/2021  2:00 PM [facility] CCM PHARMACIST [facility]  02/08/2022 11:00 AM Brita Romp, NP [facility] None  01/05/2023  3:00 PM [facility] HEALTH COACH [facility]   Multiple attempts were made to contact patient. Attempts were unsuccessful. / ls,CMA   Jobe Gibbon, [place] Pharmacist Assistant  [phone]

## 2021-12-22 NOTE — Progress Notes (Signed)
Chronic Care Management Pharmacy Note Summary: A1c at POC test with Endo was 7.9.  no only on mix insulin and [place].  Got her approved for Farxiga PAP today to receive for free.  Unsure of control since d/c of glipizide and reduction of insulin. Has FU with endocrine next month but may need to switch to basal.bolus regimen.  Would hesitate on GLP-1 due to gastroparesis.  FU 3 months with me CMA to check on BP in 2 weeks   12/29/2021 Name:  [name] MRN:  [id number] DOB:  1951/01/12  Subjective: [name] is an 71 y.o. year old female who is a primary patient of Tabori, Desiree Millet, MD.  The CCM team was consulted for assistance with disease management and care coordination needs.    Engaged with patient by telephone for follow up visit in response to provider referral for pharmacy case management and/or care coordination services.   Consent to Services:  The patient was given information about Chronic Care Management services, agreed to services, and gave verbal consent prior to initiation of services.  Please see initial visit note for detailed documentation.   Patient Care Team: [name], MD as PCP - General (Family Medicine) [name], MD as PCP - Cardiology (Cardiology) Cassandria Anger, MD as Consulting Physician (Endocrinology) [name], DPM as Consulting Physician (Podiatry) [name], MD as Consulting Physician (Pulmonary Disease) [name], MD as Consulting Physician (Ophthalmology) Claudia Desanctis, MD as Consulting Physician (Nephrology) Edythe Clarity, [facility] (Pharmacist)  Objective:  Lab Results  Component Value Date   CREATININE 2.98 (H) 10/13/2021   CREATININE 2.37 (H) 06/20/2021   CREATININE 1.43 (H) 02/05/2021    Lab Results  Component Value Date   HGBA1C 9.9 (A) 10/28/2020   Last diabetic Eye exam:  Lab Results  Component Value Date/Time   HMDIABEYEEXA Retinopathy (A) 03/17/2021  12:00 AM    Last diabetic Foot exam:  Lab Results  Component Value Date/Time   HMDIABFOOTEX yes 09/21/2009 12:00 AM        Component Value Date/Time   CHOL 162 02/05/2021 0947   TRIG 210 (H) 02/05/2021 0947   HDL 51 02/05/2021 0947   CHOLHDL 3.2 06/02/2020 0816   VLDL 42 (H) 04/17/2019 0227   LDLCALC 76 02/05/2021 0947   LDLCALC 60 06/02/2020 0816       Latest Ref Rng & Units 10/13/2021    3:22 PM 02/05/2021    9:47 AM 07/06/2020    4:49 PM  Hepatic Function  Total Protein 6.0 - 8.5 g/dL 7.5  6.7  8.0   Albumin 3.7 - 4.7 g/dL 4.2  3.9  4.0   AST 0 - 40 IU/L '15  12  18   ' ALT 0 - 32 IU/L '10  10  15   ' Alk Phosphatase 44 - 121 IU/L 67  58  55   Total Bilirubin 0.0 - 1.2 mg/dL 0.2  0.4  0.6   Bilirubin, Direct 0.00 - 0.40 mg/dL  0.14      Lab Results  Component Value Date/Time   TSH 0.692 10/13/2021 03:22 PM   TSH 0.417 (L) 10/23/2020 01:32 PM   FREET4 1.36 10/13/2021 03:22 PM   FREET4 1.29 10/23/2020 01:32 PM       Latest Ref Rng & Units 06/20/2021   12:13 AM 02/05/2021    9:47 AM 07/06/2020    4:49 PM  CBC  WBC 4.0 - 10.5 K/uL 8.8  6.7  8.9   Hemoglobin 12.0 - 15.0 g/dL 11.2  13.5  12.0   Hematocrit 36.0 - 46.0 % 36.1  42.6  37.9   Platelets 150 - 400 K/uL 328  288  291     Lab Results  Component Value Date/Time   VD25OH 15 (L) 06/02/2020 08:16 AM   VD25OH 17 (L) 10/02/2018 11:59 AM   Clinical ASCVD:  The ASCVD Risk score (Arnett DK, et al., 2019) failed to calculate for the following reasons:   The systolic blood pressure is missing     Social History   Tobacco Use  Smoking Status Never  Smokeless Tobacco Never   BP Readings from Last 3 Encounters:  11/08/21 108/71  07/15/21 136/60  06/23/21 (!) 115/53   Pulse Readings from Last 3 Encounters:  11/08/21 62  07/15/21 (!) 55  06/23/21 60   Wt Readings from Last 3 Encounters:  12/23/21 194 lb (88 kg)  11/08/21 196 lb (88.9 kg)  07/15/21 205 lb 3.2 oz (93.1 kg)    Assessment: Review of patient  past medical history, allergies, medications, health status, including review of consultants reports, laboratory and other test data, was performed as part of comprehensive evaluation and provision of chronic care management services.   SDOH:  (Social Determinants of Health) assessments and interventions performed: No, done within year Financial Resource Strain: Medium Risk (12/23/2021)   Overall Financial Resource Strain (CARDIA)    Difficulty of Paying Living Expenses: Somewhat hard    SDOH Interventions    Flowsheet Row Clinical Support from 12/23/2021 in [place] Patient Outreach Telephone from 11/17/2021 in [place] Patient Outreach Telephone from 09/10/2020 in [place] Management from 12/19/2019 in [place] [facility] [place] Patient Outreach Telephone from 10/29/2019 in [place] Management from 08/14/2019 in [place] Interventions Intervention Not Indicated Intervention Not Indicated Intervention Not Indicated Other (Comment) -- Other (Comment)  Housing Interventions Intervention Not Indicated Intervention Not Indicated -- -- Intervention Not Indicated --  Transportation Interventions Intervention Not Indicated Intervention Not Indicated Intervention Not Indicated Other (Comment) Intervention Not Indicated Other (Comment)  Alcohol Usage Interventions Intervention Not Indicated (Score <7) -- -- -- -- --  Financial Strain Interventions [id number] Referral -- -- -- -- --  Physical Activity Interventions Intervention Not Indicated -- -- -- -- --  Stress Interventions Intervention Not Indicated -- -- -- -- --  Social Connections Interventions Intervention Not Indicated -- -- -- -- --       CCM Care Plan  Allergies  Allergen Reactions   Dilaudid  [Hydromorphone Hcl] Nausea And Vomiting   Hydromorphone Nausea And Vomiting   Lipitor [Atorvastatin] Other (See Comments)    Body & Muscle Aches   Adhesive [Tape] Rash   Ampicillin Rash and Other (See Comments)    Also developed welts on her hands when she took it in the hospital   Latex Itching, Dermatitis, Rash and Other (See Comments)    Patient had a reaction to tape after surgery and they told her she was allergic to Latex    Penicillins Rash and Other (See Comments)    Has patient had a PCN reaction causing immediate rash, facial/tongue/throat swelling, SOB or lightheadedness with hypotension: No Has patient had a PCN reaction causing severe rash involving mucus membranes or skin necrosis: No Has patient had a PCN reaction  that required hospitalization: Yes - in hospital receiving treatment Has patient had a PCN reaction occurring within the last 10 years: No If all of the above answers are "NO", then may proceed with Cephalosporin use.     Medications Reviewed Today     Reviewed by Edythe Clarity, [facility] (Pharmacist) on 12/29/21 at 1508  Med List Status: <None>   Medication Order Taking? Sig Documenting Provider Last Dose Status Informant  acetaminophen (TYLENOL) 500 MG tablet [id number] Yes Take 500-1,000 mg by mouth every 6 (six) hours as needed for moderate pain, headache or mild pain. [provider] Taking Active Self  Alcohol Swabs (B-D SINGLE USE SWABS REGULAR) PADS [id number] Yes Use as directed. Dutch Quint B, FNP Taking Active   amLODipine (NORVASC) 10 MG tablet [id number] Yes Take 1 tablet (10 mg total) by mouth daily. Dutch Quint B, FNP Taking Active   aspirin 81 MG tablet [id number] Yes Take 81 mg by mouth daily. [provider] Taking Active Self  Blood Glucose Monitoring Suppl (TRUE METRIX AIR GLUCOSE METER) w/Device KIT [id number] Yes 1 kit by Does not apply route 3 (three) times daily. [name], MD Taking Active Self  carvedilol (COREG)  6.25 MG tablet [id number] Yes TAKE 1 TABLET TWICE DAILY WITH MEALS [name], MD Taking Active   dapagliflozin propanediol (FARXIGA) 10 MG TABS tablet [id number] Yes Take by mouth daily. [provider] Taking Active   famotidine (PEPCID) 40 MG tablet [id number] Yes TAKE 1 TABLET BY MOUTH EVERY DAY [name], MD Taking Active   ferrous gluconate (FERGON) 324 MG tablet [id number] Yes TAKE 1 TABLET (324 MG TOTAL) BY MOUTH 2 (TWO) TIMES DAILY WITH A MEAL. Julian Hy, DO Taking Active Self  glucose blood (TRUE METRIX BLOOD GLUCOSE TEST) test strip [id number] Yes Use as instructed Kennyth Arnold, FNP Taking Active   hydrALAZINE (APRESOLINE) 50 MG tablet [id number] Yes Take 1 tablet (50 mg total) by mouth 3 (three) times daily. Dutch Quint B, FNP Taking Active   levothyroxine (SYNTHROID) 100 MCG tablet [id number] Yes Take 1 tablet (100 mcg total) by mouth daily. Dutch Quint B, FNP Taking Active   losartan (COZAAR) 25 MG tablet [id number] Yes Take 1 tablet (25 mg total) by mouth daily. Dutch Quint B, FNP Taking Active   metoCLOPramide (REGLAN) 10 MG tablet [id number] Yes TAKE 1 TABLET FOUR TIMES DAILY [name], MD Taking Active   NOVOLIN 70/30 (70-30) 100 UNIT/ML injection [id number] Yes INJECT 45 UNITS INTO THE SKIN 2 (TWO) TIMES DAILY WITH A MEAL. [name], MD Taking Active   ondansetron [facility]) 4 MG tablet [id number] Yes Take 1 tablet (4 mg total) by mouth every 8 (eight) hours as needed for nausea or vomiting. [name], MD Taking Active   pantoprazole (PROTONIX) 40 MG tablet [id number] Yes Take 1 tablet (40 mg total) by mouth 2 (two) times daily. Dutch Quint B, FNP Taking Active   polyethylene glycol Merril Abbe / GLYCOLAX) packet [id number] Yes Take 17 g by mouth daily as needed for moderate constipation (MIX AND DRINK). [provider] Taking Active Self  rosuvastatin (CRESTOR) 20 MG tablet [id number] Yes TAKE 1 TABLET AT BEDTIME [name], MD Taking Active   SUMAtriptan (IMITREX) 50 MG tablet [id number] Yes TAKE 1 TAB EVERY 2 HRS AS NEEDED FOR MIGRAINE. MAY REPEAT IN 2 HOURS IF HEADACHE PERSISTS OR RECURS. [name], MD Taking Active   torsemide (DEMADEX) 20 MG tablet [id number] Yes Take  1 tablet (20 mg total) by mouth 2 (two) times daily. Kennyth Arnold, FNP Taking Active   TRUEplus Lancets 33G MISC [id number] Yes Use as directed Kennyth Arnold, FNP Taking Active             Patient Active Problem List   Diagnosis Date Noted   [place]mer toe of second toe of left foot 05/05/2021   Corns and callosities 05/05/2021   Hav (hallux abducto valgus), unspecified laterality 05/05/2021   Hyperlipidemia associated with type 2 diabetes mellitus ([place]) 02/01/2021   Depression 02/01/2021   Localized osteoporosis without current pathological fracture 05/01/2020   Hyperparathyroidism ([place]) 12/13/2019   Elevated hemoglobin (HCC) 12/13/2019   Chronic renal impairment, stage 4 (severe) ([place]) 10/28/2019   Anemia 10/28/2019   Physical exam 06/26/2019   Neck pain 04/18/2019   Ascending aortic aneurysm ([place]) 04/17/2019   Hypoxia    Nonspecific chest pain 04/15/2019   Neuropathic pain of right flank 12/27/2018   Dysphagia 12/27/2018   Postsurgical hypothyroidism 06/11/2018   Vitamin D deficiency 06/11/2018   Multinodular goiter 06/11/2018   Hypercalcemia 06/11/2018   DM type 2 causing vascular disease (HCC)    PONV (postoperative nausea and vomiting)    Peripheral neuropathy    Migraine    Irritable bowel syndrome    Hypertension    History of right mastectomy    H/O hiatal hernia    GERD (gastroesophageal reflux disease)    Family history of anesthesia complication    Diabetes mellitus type 2, uncontrolled, with complications    Lower extremity edema    Chronic constipation 06/23/2015   Morbid obesity ([place]) 06/12/2015   Chronic diastolic heart failure ([place]) 11/17/2014   Herniated nucleus pulposus, L2-3  right 02/04/2014    Class: Acute   Spondylolisthesis of lumbar region 04/30/2012    Class: Chronic   Lumbar stenosis with neurogenic claudication 04/30/2012    Class: Chronic   SYNCOPE-CAROTID SINUS 09/16/2008   OSTEOARTHRITIS 08/23/2006   DDD (degenerative disc disease), cervical 08/23/2006   [place] DISEASE, LUMBAR 08/23/2006   Headache(784.0) 08/23/2006   BREAST CANCER, HX OF 08/23/2006   HYPERPARATHYROIDISM, HX OF 08/23/2006   RENAL CALCULUS, HX OF 08/23/2006    Immunization History  Administered Date(s) Administered   Fluad Quad(high Dose 65+) 12/13/2019, 02/01/2021   Influenza Split 01/31/2011   Influenza Whole 02/23/2007   Influenza, High Dose Seasonal PF 01/10/2017   Influenza,inj,Quad PF,6+ Mos 01/07/2014, 02/09/2016, 01/01/2018, 12/03/2018   Influenza-Unspecified 01/10/2012   PFIZER Comirnaty(Gray Top)Covid-19 Tri-Sucrose Vaccine 05/24/2019, 06/18/2019, 01/11/2020, 08/15/2020   PFIZER(Purple Top)SARS-COV-2 Vaccination 05/24/2019, 06/18/2019, 01/11/2020   Pneumococcal Conjugate-13 05/16/2019   Pneumococcal Polysaccharide-23 08/25/2014   Tdap 04/20/2011    Conditions to be addressed/monitored: Hypertension, Hyperlipidemia, Diabetes, GERD, COPD, Chronic Kidney Disease and Osteoporosis  Care Plan : General Pharmacy (Adult)  Updates made by Edythe Clarity, RPH since 12/29/2021 12:00 AM     Problem: Hypertension, Hyperlipidemia, Diabetes, GERD, COPD, Chronic Kidney Disease and Osteoporosis   Priority: High  Onset Date: 06/10/2021     Long-Range Goal: Patient-Specific Goal   Start Date: 06/10/2021  Expected End Date: 12/11/2021  Recent Progress: On track  Priority: High  Note:   Pharmacist Clinical Goal(s):  Patient will achieve adherence to monitoring guidelines and medication adherence to achieve therapeutic efficacy contact provider office for questions/concerns as evidenced notation of same in electronic health record through collaboration with PharmD and provider.    Interventions: 1:1 collaboration with [name], MD regarding development and update of comprehensive  plan of care as evidenced by provider attestation and co-signature Inter-disciplinary care team collaboration (see longitudinal plan of care) Comprehensive medication review performed; medication list updated in electronic medical record  Diabetes (A1c goal <7%) -Uncontrolled -Current medications: Novolin 70/30 30 units BID with a meal Appropriate, Query Effective Farxiga 41m Appropriate, Query effective, -Medications previously tried: Glyxambi, Toujeo, glipizide -Current home glucose readings fasting glucose: 170s-180s in the morning post prandial glucose: > 200 at bedtime -Denies hypoglycemic/hyperglycemic symptoms -Current exercise: minimal, back pain is really holding her back -Educated on A1c and blood sugar goals; Prevention and management of hypoglycemic episodes; Benefits of routine self-monitoring of blood sugar; -She was recently taken off glipizide and started on Farxiga as she was having overnight low glucose.  Her blood sugar seems as if it has been creeping up since these changes.  Considered GLP-1 but she is on Reglan at the moment and complains of bloating so I would be hesitant to start this.  She may need to move to more of a basal bolus regimen to cover her meals because she is not consistent with her meals.  She has FU with endocrine next month.  Her most recent A1c reported at her last visit was 7.9 - big improvement from 9.9!  Will check at this visit and adjust insulin as needed.  Hypertension (BP goal <130/80)12/29/21 -Controlled, based on office BP -Current treatment: Hydralazine 50 mg three times daily Appropriate, Effective, safe, accessible Losartan 25 mg once dailyAppropriate, Effective, safe, accessible Torsemide 20 mg BID Appropriate, Effective, safe, accessible Amlodipine 115mdailyAppropriate, Effective, safe, accessible Carvedilol 6.2648mtwice daily AAppropriate, Effective, safe, accessible -Current home readings: unsure of what it has been lately -Current exercise habits: no formal exercise - limited due to chronic pain - planing on getting spinal cord stimulator  -Reports some dizziness - have asked her to check her BP when she feels this. No changes to meds at this time, pending home BP monitoring will assess for hypotension with reported dizziness. No changes at this time - CMA to FU in 2 weeks on home BP.   Update 06/09/21 Patient reports she has not been taking any of her oral medication on regular basis.  She reports a BP of systolic 200062Ie time, unclear how accurate this was.  She denies any dizziness or HA.  We spent most of the visit talking about importance of adherence.  Patient agrees to start taking her medications more regularly and agrees to me having someone check in with her regularly to see if she is taking her medications. She gets mail order which is preferred so [facility] services were not an option. We cannot adjust BP meds if she is not taking what she is prescribed. Will have CMA call patient next week to ensure she is back on track with medications.  Osteoporosis (goal prevent fractures) -Controlled, not assessed -Reports to be using consistently without issue  -Current regimen: None -Reviewed side effects - no problems noted  -Last DEXA 03/2020 - osteoporosis  Hyperlipidemia: (LDL goal < 70) -Controlled, not assessed -Several cardiac risk factors  -Current treatment: Rosuvastatin 20 mg once daily (90 DS sent 4/22) -Reviewed adherence with patient - reports consistent use. Denies any recent side effects of problems -Educated on Cholesterol goals;  Benefits of statin for ASCVD risk reduction; -Recommended to continue current medication  -Per Dr CreStanford Breed will need f/u imagining march 2023 on thoracic aortic aneursym   GERD (Goal: minimize symptoms, optimize medication safety) -Controlled,  not assessed -  Daily ASA -Some ongoing symptoms but feels controlled well overall. -Several questions about metoclopramide and pantoprazole - reviewed at length with patient, all questions answered. -Current treatment: Pantoprazole 40 mg twice daily - 1 tablet twice daily  Metoclopramide 10 mg four times daily  -Dietary triggers reviewed at length  -No problems with current regimen per patient. No side effects noted -Recommended continue current medications   Patient Goals/Self-Care Activities Patient will:  - target a minimum of 150 minutes of moderate intensity exercise weekly engage in dietary modifications by minimizing snacking   Follow Up Plan: Telephone follow up appointment with care management team member scheduled for: Medication Assistance: None required.  Patient affirms current coverage meets needs.         Compliance/Adherence/Medication fill history: Care Gaps: A1c, colonoscopy  Star-Rating Drugs: Losartan 47m 08/11/21 90ds Rosuvastatin 267m05/03/23 90ds  Patient's [facility] pharmacy is:  [id number] [place] [place]one: [phone]: [phone] [facility] - [address] [place]one: [phone]: [phone] Up:  Patient agrees to Care Plan and Follow-up.  Plan: Regular follow up to promote adherence from [facility] Appointments  Date Time Provider DepNew London0/31/2023 11:00 AM ReaBrita RompP REA-REA None  01/05/2023  3:00 PM [facility] HE[facility] COACH [facility] PECOrtonvilleharmD Clinical Pharmacist  [phone]

## 2021-12-23 ENCOUNTER — Ambulatory Visit (INDEPENDENT_AMBULATORY_CARE_PROVIDER_SITE_OTHER): Payer: Medicare HMO

## 2021-12-23 ENCOUNTER — Telehealth: Payer: Self-pay

## 2021-12-23 VITALS — Ht 64.0 in | Wt 194.0 lb

## 2021-12-23 DIAGNOSIS — Z596 Low income: Secondary | ICD-10-CM

## 2021-12-23 DIAGNOSIS — Z59868 Other specified financial insecurity: Secondary | ICD-10-CM

## 2021-12-23 DIAGNOSIS — Z1211 Encounter for screening for malignant neoplasm of colon: Secondary | ICD-10-CM | POA: Diagnosis not present

## 2021-12-23 DIAGNOSIS — Z0000 Encounter for general adult medical examination without abnormal findings: Secondary | ICD-10-CM

## 2021-12-23 NOTE — Patient Instructions (Signed)
[name] , Thank you for taking time to come for your Medicare Wellness Visit. I appreciate your ongoing commitment to your health goals. Please review the following plan we discussed and let me know if I can assist you in the future.   Screening recommendations/referrals: Colonoscopy: Patient declined at this time  Mammogram: referral 12/23/2021 Bone Density: 03/25/2020 Recommended yearly ophthalmology/optometry visit for glaucoma screening and checkup Recommended yearly dental visit for hygiene and checkup  Vaccinations: Influenza vaccine: due in the fall Pneumococcal vaccine: completed  Tdap vaccine: due  Shingles vaccine: will consider    Covid-19:completed   Advanced directives: Please bring a copy of your health care power of attorney and living will to the office to be added to your chart at your convenience.   Conditions/risks identified: Aim for 30 minutes of exercise or brisk walking, 6-8 glasses of water, and 5 servings of fruits and vegetables each day.   Next appointment: Follow up in one year for your annual wellness visit    Preventive Care 65 Years and Older, Female Preventive care refers to lifestyle choices and visits with your health care provider that can promote health and wellness. What does preventive care include? A yearly physical exam. This is also called an annual well check. Dental exams once or twice a year. Routine eye exams. Ask your health care provider how often you should have your eyes checked. Personal lifestyle choices, including: Daily care of your teeth and gums. Regular physical activity. Eating a healthy diet. Avoiding tobacco and drug use. Limiting alcohol use. Practicing safe sex. Taking low-dose aspirin every day. Taking vitamin and mineral supplements as recommended by your health care provider. What happens during an annual well check? The services and screenings done by your health care provider during your annual well check  will depend on your age, overall health, lifestyle risk factors, and family history of disease. Counseling  Your health care provider may ask you questions about your: Alcohol use. Tobacco use. Drug use. Emotional well-being. Home and relationship well-being. Sexual activity. Eating habits. History of falls. Memory and ability to understand (cognition). Work and work Statistician. Reproductive health. Screening  You may have the following tests or measurements: Height, weight, and BMI. Blood pressure. Lipid and cholesterol levels. These may be checked every 5 years, or more frequently if you are over 73 years old. Skin check. Lung cancer screening. You may have this screening every year starting at age 73 if you have a 30-pack-year history of smoking and currently smoke or have quit within the past 15 years. Fecal occult blood test (FOBT) of the stool. You may have this test every year starting at age 73. Flexible sigmoidoscopy or colonoscopy. You may have a sigmoidoscopy every 5 years or a colonoscopy every 10 years starting at age 73. Hepatitis C blood test. Hepatitis B blood test. Sexually transmitted disease (STD) testing. Diabetes screening. This is done by checking your blood sugar (glucose) after you have not eaten for a while (fasting). You may have this done every 1-3 years. Bone density scan. This is done to screen for osteoporosis. You may have this done starting at age 73. Mammogram. This may be done every 1-2 years. Talk to your health care provider about how often you should have regular mammograms. Talk with your health care provider about your test results, treatment options, and if necessary, the need for more tests. Vaccines  Your health care provider may recommend certain vaccines, such as: Influenza vaccine. This is recommended every  year. Tetanus, diphtheria, and acellular pertussis (Tdap, Td) vaccine. You may need a Td booster every 10 years. Zoster vaccine. You  may need this after age 73. Pneumococcal 13-valent conjugate (PCV13) vaccine. One dose is recommended after age 73. Pneumococcal polysaccharide (PPSV23) vaccine. One dose is recommended after age 73. Talk to your health care provider about which screenings and vaccines you need and how often you need them. This information is not intended to replace advice given to you by your health care provider. Make sure you discuss any questions you have with your health care provider. Document Released: 04/24/2015 Document Revised: 12/16/2015 Document Reviewed: 01/27/2015 Elsevier Interactive Patient Education  2017 [employer] Prevention in the Home Falls can cause injuries. They can happen to people of all ages. There are many things you can do to make your home safe and to help prevent falls. What can I do on the outside of my home? Regularly fix the edges of walkways and driveways and fix any cracks. Remove anything that might make you trip as you walk through a door, such as a raised step or threshold. Trim any bushes or trees on the path to your home. Use bright outdoor lighting. Clear any walking paths of anything that might make someone trip, such as rocks or tools. Regularly check to see if handrails are loose or broken. Make sure that both sides of any steps have handrails. Any raised decks and porches should have guardrails on the edges. Have any leaves, snow, or ice cleared regularly. Use sand or salt on walking paths during winter. Clean up any spills in your garage right away. This includes oil or grease spills. What can I do in the bathroom? Use night lights. Install grab bars by the toilet and in the tub and shower. Do not use towel bars as grab bars. Use non-skid mats or decals in the tub or shower. If you need to sit down in the shower, use a plastic, non-slip stool. Keep the floor dry. Clean up any water that spills on the floor as soon as it happens. Remove soap buildup  in the tub or shower regularly. Attach bath mats securely with double-sided non-slip rug tape. Do not have throw rugs and other things on the floor that can make you trip. What can I do in the bedroom? Use night lights. Make sure that you have a light by your bed that is easy to reach. Do not use any sheets or blankets that are too big for your bed. They should not hang down onto the floor. Have a firm chair that has side arms. You can use this for support while you get dressed. Do not have throw rugs and other things on the floor that can make you trip. What can I do in the kitchen? Clean up any spills right away. Avoid walking on wet floors. Keep items that you use a lot in easy-to-reach places. If you need to reach something above you, use a strong step stool that has a grab bar. Keep electrical cords out of the way. Do not use floor polish or wax that makes floors slippery. If you must use wax, use non-skid floor wax. Do not have throw rugs and other things on the floor that can make you trip. What can I do with my stairs? Do not leave any items on the stairs. Make sure that there are handrails on both sides of the stairs and use them. Fix handrails that are broken or  loose. Make sure that handrails are as long as the stairways. Check any carpeting to make sure that it is firmly attached to the stairs. Fix any carpet that is loose or worn. Avoid having throw rugs at the top or bottom of the stairs. If you do have throw rugs, attach them to the floor with carpet tape. Make sure that you have a light switch at the top of the stairs and the bottom of the stairs. If you do not have them, ask someone to add them for you. What else can I do to help prevent falls? Wear shoes that: Do not have high heels. Have rubber bottoms. Are comfortable and fit you well. Are closed at the toe. Do not wear sandals. If you use a stepladder: Make sure that it is fully opened. Do not climb a closed  stepladder. Make sure that both sides of the stepladder are locked into place. Ask someone to hold it for you, if possible. Clearly mark and make sure that you can see: Any grab bars or handrails. First and last steps. Where the edge of each step is. Use tools that help you move around (mobility aids) if they are needed. These include: Canes. Walkers. Scooters. Crutches. Turn on the lights when you go into a dark area. Replace any light bulbs as soon as they burn out. Set up your furniture so you have a clear path. Avoid moving your furniture around. If any of your floors are uneven, fix them. If there are any pets around you, be aware of where they are. Review your medicines with your doctor. Some medicines can make you feel dizzy. This can increase your chance of falling. Ask your doctor what other things that you can do to help prevent falls. This information is not intended to replace advice given to you by your health care provider. Make sure you discuss any questions you have with your health care provider. Document Released: 01/22/2009 Document Revised: 09/03/2015 Document Reviewed: 05/02/2014 Elsevier Interactive Patient Education  2017 [employer].

## 2021-12-23 NOTE — Telephone Encounter (Signed)
   Telephone encounter was:  Unsuccessful.  12/23/2021 Name: [name] MRN: [id number] DOB: 06/29/1950  Unsuccessful outbound call made today to assist with:   RX  Outreach Attempt:  1st Attempt  Unable to leave a message   [place], [employer]  [phone] [address], [place], [place] [address] Phone: [phone] Email: [email or web address]

## 2021-12-23 NOTE — Progress Notes (Signed)
Subjective:   [name] is a 71 y.o. female who presents for Medicare Annual (Subsequent) preventive examination.   Virtual Visit via Telephone Note  I connected with  OCEAN KEARLEY on 12/23/21 at  2:15 PM EDT by telephone and verified that I am speaking with the correct person using two identifiers.  Location: Patient: home  Provider: summer field  Persons participating in the virtual visit: patient/Nurse Health Advisor   I discussed the limitations, risks, security and privacy concerns of performing an evaluation and management service by telephone and the availability of in person appointments. The patient expressed understanding and agreed to proceed.  Interactive audio and video telecommunications were attempted between this nurse and patient, however failed, due to patient having technical difficulties OR patient did not have access to video capability.  We continued and completed visit with audio only.  Some vital signs may be absent or patient reported.   Daphane Shepherd, LPN  Review of Systems     Cardiac Risk Factors include: advanced age (>3mn, >>35women);hypertension;dyslipidemia     Objective:    Today's Vitals   12/23/21 1421  Weight: 194 lb (88 kg)  Height: '5\' 4"'  (1.626 m)   Body mass index is 33.3 kg/m.     12/23/2021    2:30 PM 06/20/2021   10:31 PM 09/10/2020   12:13 PM 07/06/2020    3:21 PM 11/04/2019    8:29 PM 10/29/2019   10:12 AM 10/18/2019    9:44 PM  Advanced Directives  Does Patient Have a Medical Advance Directive? Yes No No Yes No No No  Type of AParamedicof [place] will   [place] will     Does patient want to make changes to medical advance directive?   No - Patient declined   No - Patient declined   Copy of HWakemanin Chart? No - copy requested        Would patient like information on creating a medical advance directive?  Yes (ED - Information  included in AVS) No - Patient declined   No - Patient declined     Current Medications (verified) Outpatient Encounter Medications as of 12/23/2021  Medication Sig   acetaminophen (TYLENOL) 500 MG tablet Take 500-1,000 mg by mouth every 6 (six) hours as needed for moderate pain, headache or mild pain.   Alcohol Swabs (B-D SINGLE USE SWABS REGULAR) PADS Use as directed.   amLODipine (NORVASC) 10 MG tablet Take 1 tablet (10 mg total) by mouth daily.   aspirin 81 MG tablet Take 81 mg by mouth daily.   Blood Glucose Monitoring Suppl (TRUE METRIX AIR GLUCOSE METER) w/Device KIT 1 kit by Does not apply route 3 (three) times daily.   carvedilol (COREG) 6.25 MG tablet TAKE 1 TABLET TWICE DAILY WITH MEALS   dapagliflozin propanediol (FARXIGA) 10 MG TABS tablet Take by mouth daily.   famotidine (PEPCID) 40 MG tablet TAKE 1 TABLET BY MOUTH EVERY DAY   glucose blood (TRUE METRIX BLOOD GLUCOSE TEST) test strip Use as instructed   hydrALAZINE (APRESOLINE) 50 MG tablet Take 1 tablet (50 mg total) by mouth 3 (three) times daily.   levothyroxine (SYNTHROID) 100 MCG tablet Take 1 tablet (100 mcg total) by mouth daily.   NOVOLIN 70/30 (70-30) 100 UNIT/ML injection INJECT 45 UNITS INTO THE SKIN 2 (TWO) TIMES DAILY WITH A MEAL.   ondansetron (ZOFRAN) 4 MG tablet Take 1 tablet (4 mg total)  by mouth every 8 (eight) hours as needed for nausea or vomiting.   pantoprazole (PROTONIX) 40 MG tablet Take 1 tablet (40 mg total) by mouth 2 (two) times daily.   polyethylene glycol (MIRALAX / GLYCOLAX) packet Take 17 g by mouth daily as needed for moderate constipation (MIX AND DRINK).   rosuvastatin (CRESTOR) 20 MG tablet TAKE 1 TABLET AT BEDTIME   SUMAtriptan (IMITREX) 50 MG tablet TAKE 1 TAB EVERY 2 HRS AS NEEDED FOR MIGRAINE. MAY REPEAT IN 2 HOURS IF HEADACHE PERSISTS OR RECURS.   torsemide (DEMADEX) 20 MG tablet Take 1 tablet (20 mg total) by mouth 2 (two) times daily.   TRUEplus Lancets 33G MISC Use as directed    ferrous gluconate (FERGON) 324 MG tablet TAKE 1 TABLET (324 MG TOTAL) BY MOUTH 2 (TWO) TIMES DAILY WITH A MEAL. (Patient not taking: Reported on 12/23/2021)   losartan (COZAAR) 25 MG tablet Take 1 tablet (25 mg total) by mouth daily. (Patient not taking: Reported on 12/23/2021)   metoCLOPramide (REGLAN) 10 MG tablet TAKE 1 TABLET FOUR TIMES DAILY (Patient not taking: Reported on 12/23/2021)   No facility-administered encounter medications on file as of 12/23/2021.    Allergies (verified) Dilaudid [hydromorphone hcl], Hydromorphone, Lipitor [atorvastatin], Adhesive [tape], Ampicillin, Latex, and Penicillins   History: Past Medical History:  Diagnosis Date   Anginal pain ([place]) 03/19/2012   saw Dr. Cathie Olden .. she thinks its more related to stomach issues   Arthritis    "all over; mostly in my back" (03/20/2012)   Breast cancer ([place])    "right" (03/20/2012)   Cardiomyopathy    Chest pain 06/2005   Hospitalized, dystolic dysfunction   Chronic lower back pain    "have 2 herniated discs; going to have to have a fusion" (03/20/2012)   CKD (chronic kidney disease), stage III ([place])    Family history of anesthesia complication    "father has nausea and vomiting"   Gallstones    GERD (gastroesophageal reflux disease)    H/O hiatal hernia    History of kidney stones    History of right mastectomy    Hypertension    "patient states never had HTN, takes Hyzaar for heart   Hypothyroidism    Irritable bowel syndrome    Migraines    "it's been a long time since I've had one" (03/20/2012)   Osteoarthritis    Peripheral neuropathy    PONV (postoperative nausea and vomiting)    Sciatica    Type II diabetes mellitus ([place])    Past Surgical History:  Procedure Laterality Date   ABDOMINAL HYSTERECTOMY  1993   adenosin cardiolite  07/2005   (+) wall motion abnormality   AXILLARY LYMPH NODE DISSECTION  2003   squamous cell cancer   BACK SURGERY     CARDIAC CATHETERIZATION  ? 2005 / 2007    CHOLECYSTECTOMY N/A 01/24/2018   Procedure: LAPAROSCOPIC CHOLECYSTECTOMY;  Surgeon: Erroll Luna, MD;  Location: [facility];  Service: General;  Laterality: N/A;   CT abdomen and pelvis  02/2010   Same   ESOPHAGOGASTRODUODENOSCOPY  2005   GERD   KNEE ARTHROSCOPY  1990's   "right" (03/20/2012)   LUMBAR FUSION N/A 08/22/2014   Procedure: Right L2-3 and right L1-2 transforaminal lumbar interbody fusion with pedicle screws, rods, sleeves, cages, local bone graft, Vivigen, cancellous chips;  Surgeon: Jessy Oto, MD;  Location: [facility];  Service: Orthopedics;  Laterality: N/A;   LUMBAR LAMINECTOMY/DECOMPRESSION MICRODISCECTOMY N/A 02/04/2014   Procedure: RIGHT L2-3  MICRODISCECTOMY ;  Surgeon: Jessy Oto, MD;  Location: [facility];  Service: Orthopedics;  Laterality: N/A;   MASTECTOMY Right    MASTECTOMY WITH AXILLARY LYMPH NODE DISSECTION  12/2003   "right" (03/20/2012)   POSTERIOR CERVICAL FUSION/FORAMINOTOMY  2001   SHOULDER ARTHROSCOPY W/ ROTATOR CUFF REPAIR  2003   Left   THYROIDECTOMY  1977   Right   TOTAL KNEE ARTHROPLASTY  2000   Right   [place] of abdomen  07/2005   Fatty liver / kidney stones   Family History  Problem Relation Age of Onset   Hypertension Mother    Breast cancer Mother 55   Diabetes Father    Hypertension Father    Hypertension Sister    Hypertension Brother    Hypertension Brother    Diabetes Brother    Diabetes Brother    Diabetes Other        Family Hx. of Diabetes   Hypertension Other        Family History of HTN   Deep vein thrombosis Daughter    Breast cancer Maternal Aunt    Colon cancer Neg Hx    Esophageal cancer Neg Hx    Liver cancer Neg Hx    Rectal cancer Neg Hx    Stomach cancer Neg Hx    Social History   Socioeconomic History   Marital status: Married    Spouse name: Not on file   Number of children: 1   Years of education: Not on file   Highest education level: Not on file  Occupational History   Occupation: Retired since 2003   Tobacco Use   Smoking status: Never   Smokeless tobacco: Never  Vaping Use   Vaping Use: Never used  Substance and Sexual Activity   Alcohol use: No   Drug use: No   Sexual activity: Not on file  Other Topics Concern   Not on file  Social History Narrative   Not on file   Social Determinants of Health   Financial Resource Strain: Medium Risk (12/23/2021)   Overall Financial Resource Strain (CARDIA)    Difficulty of Paying Living Expenses: Somewhat hard  Food Insecurity: No Food Insecurity (12/23/2021)   Hunger Vital Sign    Worried About Running Out of Food in the Last Year: Never true    Ran Out of Food in the Last Year: Never true  Transportation Needs: No Transportation Needs (12/23/2021)   PRAPARE - Hydrologist (Medical): No    Lack of Transportation (Non-Medical): No  Physical Activity: Insufficiently Active (12/23/2021)   Exercise Vital Sign    Days of Exercise per Week: 3 days    Minutes of Exercise per Session: 30 min  Stress: No Stress Concern Present (12/23/2021)   [place]    Feeling of Stress : Not at all  Social Connections: Moderately Integrated (12/23/2021)   Social Connection and Isolation Panel [NHANES]    Frequency of Communication with Friends and Family: More than three times a week    Frequency of Social Gatherings with Friends and Family: More than three times a week    Attends Religious Services: More than 4 times per year    Active Member of [employer] or Organizations: No    Attends Archivist Meetings: Never    Marital Status: Married    Tobacco Counseling Counseling given: Not Answered   Clinical Intake:  Pre-visit preparation completed: Yes  Pain : No/denies pain     Nutritional Risks: None Diabetes: Yes CBG done?: No Did pt. bring in CBG monitor from home?: No  How often do you need to have someone help you when you read  instructions, pamphlets, or other written materials from your doctor or pharmacy?: 1 - Never  Diabetic?yes  Nutrition Risk Assessment:  Has the patient had any N/V/D within the last 2 months?  No  Does the patient have any non-healing wounds?  No  Has the patient had any unintentional weight loss or weight gain?  No   Diabetes:  Is the patient diabetic?  Yes  If diabetic, was a CBG obtained today?  No  Did the patient bring in their glucometer from home?  No  How often do you monitor your CBG's? 3x. Day   Financial Strains and Diabetes Management:  Are you having any financial strains with the device, your supplies or your medication? No .  Does the patient want to be seen by Chronic Care Management for management of their diabetes?  No  Would the patient like to be referred to a Nutritionist or for Diabetic Management?  No   Diabetic Exams:  Diabetic Eye Exam: Overdue for diabetic eye exam. Pt has been advised about the importance in completing this exam. Patient advised to call and schedule an eye exam. Diabetic Foot Exam: Overdue, Pt has been advised about the importance in completing this exam. Pt is scheduled for diabetic foot exam on next office visit .   Interpreter Needed?: No  Information entered by :: Jadene Pierini, LPN   Activities of Daily Living    12/23/2021    2:31 PM  In your present state of health, do you have any difficulty performing the following activities:  Hearing? 0  Vision? 0  Difficulty concentrating or making decisions? 0  Walking or climbing stairs? 0  Dressing or bathing? 0  Doing errands, shopping? 0  Preparing Food and eating ? N  Using the Toilet? N  In the past six months, have you accidently leaked urine? N  Do you have problems with loss of bowel control? N  Managing your Medications? N  Managing your Finances? N  Housekeeping or managing your Housekeeping? N    Patient Care Team: [name], MD as PCP - General (Family  Medicine) [name], MD as PCP - Cardiology (Cardiology) Cassandria Anger, MD as Consulting Physician (Endocrinology) [name], DPM as Consulting Physician (Podiatry) [name], MD as Consulting Physician (Pulmonary Disease) [name], MD as Consulting Physician (Ophthalmology) Claudia Desanctis, MD as Consulting Physician (Nephrology) Edythe Clarity, [facility] (Pharmacist)  Indicate any recent Medical Services you may have received from other than [facility] providers in the past year (date may be approximate).     Assessment:   This is a routine wellness examination for [place].  Hearing/Vision screen Vision Screening - Comments:: Due annual eye wears glasses Dr.Digby   Dietary issues and exercise activities discussed: Current Exercise Habits: Home exercise routine, Time (Minutes): 30, Frequency (Times/Week): 3, Weekly Exercise (Minutes/Week): 90, Intensity: Mild, Exercise limited by: None identified   Goals Addressed             This Visit's Progress    Exercise 3x per week (30 min per time)         Depression Screen    12/23/2021    2:26 PM 11/17/2021   10:46 AM 03/08/2021    1:03 PM 09/10/2020  12:15 PM 07/17/2020   10:33 AM 06/25/2020   10:00 AM 04/29/2020   12:33 PM  PHQ 2/9 Scores  PHQ - 2 Score 0 0 1 0 0 0 0  PHQ- 9 Score   4  0 0 0    Fall Risk    12/23/2021    2:23 PM 03/08/2021    1:03 PM 12/10/2020   11:42 AM 09/10/2020   12:15 PM 07/17/2020   10:33 AM  Fall Risk   Falls in the past year? 0 0 0 0 0  Number falls in past yr: 0  0 0 0  Injury with Fall? 0  0 0 0  Risk for fall due to : No Fall Risks No Fall Risks Impaired balance/gait;Medication side effect Impaired balance/gait;Impaired mobility;Medication side effect No Fall Risks  Follow up Falls prevention discussed Falls evaluation completed Falls evaluation completed;Education provided Education provided;Falls evaluation completed     FALL RISK PREVENTION PERTAINING TO THE  HOME:  Any stairs in or around the home? No  If so, are there any without handrails? No  Home free of loose throw rugs in walkways, pet beds, electrical cords, etc? Yes  Adequate lighting in your home to reduce risk of falls? Yes   ASSISTIVE DEVICES UTILIZED TO PREVENT FALLS:  Life alert? No  Use of a cane, walker or w/c? Yes  Grab bars in the bathroom? Yes  Shower chair or bench in shower? No  Elevated toilet seat or a handicapped toilet? No          12/23/2021    2:31 PM 06/26/2019   10:03 AM  6CIT Screen  What Year? 0 points 0 points  What month? 0 points 0 points  What time? 0 points 0 points  Count back from 20 0 points 0 points  Months in reverse 0 points 0 points  Repeat phrase 2 points 0 points  Total Score 2 points 0 points    Immunizations Immunization History  Administered Date(s) Administered   Fluad Quad(high Dose 65+) 12/13/2019, 02/01/2021   Influenza Split 01/31/2011   Influenza Whole 02/23/2007   Influenza, High Dose Seasonal PF 01/10/2017   Influenza,inj,Quad PF,6+ Mos 01/07/2014, 02/09/2016, 01/01/2018, 12/03/2018   Influenza-Unspecified 01/10/2012   PFIZER Comirnaty(Gray Top)Covid-19 Tri-Sucrose Vaccine 05/24/2019, 06/18/2019, 01/11/2020, 08/15/2020   PFIZER(Purple Top)SARS-COV-2 Vaccination 05/24/2019, 06/18/2019, 01/11/2020   Pneumococcal Conjugate-13 05/16/2019   Pneumococcal Polysaccharide-23 08/25/2014   Tdap 04/20/2011    TDAP status: Due, Education has been provided regarding the importance of this vaccine. Advised may receive this vaccine at local pharmacy or Health Dept. Aware to provide a copy of the vaccination record if obtained from local pharmacy or Health Dept. Verbalized acceptance and understanding.  Flu Vaccine status: Due, Education has been provided regarding the importance of this vaccine. Advised may receive this vaccine at local pharmacy or Health Dept. Aware to provide a copy of the vaccination record if obtained from local  pharmacy or Health Dept. Verbalized acceptance and understanding.  Pneumococcal vaccine status: Up to date  Covid-19 vaccine status: Completed vaccines  Qualifies for Shingles Vaccine? Yes   Zostavax completed No   Shingrix Completed?: No.    Education has been provided regarding the importance of this vaccine. Patient has been advised to call insurance company to determine out of pocket expense if they have not yet received this vaccine. Advised may also receive vaccine at local pharmacy or Health Dept. Verbalized acceptance and understanding.  Screening Tests Health Maintenance  Topic Date Due  Hepatitis C Screening  Never done   Zoster Vaccines- Shingrix (1 of 2) Never done   COLONOSCOPY (Pts 45-58yr Insurance coverage will need to be confirmed)  06/09/2013   COVID-19 Vaccine (8 - Pfizer risk series) 10/10/2020   TETANUS/TDAP  04/19/2021   HEMOGLOBIN A1C  04/30/2021   MAMMOGRAM  08/25/2021   INFLUENZA VACCINE  11/09/2021   FOOT EXAM  02/01/2022   OPHTHALMOLOGY EXAM  03/17/2022   Diabetic kidney evaluation - Urine ACR  08/25/2022   Diabetic kidney evaluation - GFR measurement  10/14/2022   Pneumonia Vaccine 71 Years old  Completed   DEXA SCAN  Completed   HPV VACCINES  Aged Out    Health Maintenance  Health Maintenance Due  Topic Date Due   Hepatitis C Screening  Never done   Zoster Vaccines- Shingrix (1 of 2) Never done   COLONOSCOPY (Pts 45-457yrInsurance coverage will need to be confirmed)  06/09/2013   COVID-19 Vaccine (8 - Pfizer risk series) 10/10/2020   TETANUS/TDAP  04/19/2021   HEMOGLOBIN A1C  04/30/2021   MAMMOGRAM  08/25/2021   INFLUENZA VACCINE  11/09/2021    Colorectal cancer screening: Referral to GI placed Patient declines at this time . Pt aware the office will call re: appt.  Mammogram status: Ordered 12/23/2021. Pt provided with contact info and advised to call to schedule appt.   Bone Density status: Completed 03/25/2020. Results reflect: Bone  density results: OSTEOPENIA. Repeat every 5 years.  Lung Cancer Screening: (Low Dose CT Chest recommended if Age 73-80ears, 30 pack-year currently smoking OR have quit w/in 15years.) does not qualify.   Lung Cancer Screening Referral: n/a  Additional Screening:  Hepatitis C Screening: does not qualify;   Vision Screening: Recommended annual ophthalmology exams for early detection of glaucoma and other disorders of the eye. Is the patient up to date with their annual eye exam?  No  Who is the provider or what is the name of the office in which the patient attends annual eye exams? Patient to schedule appointment with Dr. DiBing PlumeIf pt is not established with a provider, would they like to be referred to a provider to establish care? No .   Dental Screening: Recommended annual dental exams for proper oral hygiene  Community Resource Referral / Chronic Care Management: CRR required this visit?  No   CCM required this visit?  No      Plan:     I have personally reviewed and noted the following in the patient's chart:   Medical and social history Use of alcohol, tobacco or illicit drugs  Current medications and supplements including opioid prescriptions. Patient is not currently taking opioid prescriptions. Functional ability and status Nutritional status Physical activity Advanced directives List of other physicians Hospitalizations, surgeries, and ER visits in previous 12 months Vitals Screenings to include cognitive, depression, and falls Referrals and appointments  In addition, I have reviewed and discussed with patient certain preventive protocols, quality metrics, and best practice recommendations. A written personalized care plan for preventive services as well as general preventive health recommendations were provided to patient.     LaDaphane ShepherdLPN   12/13/06/6578 Nurse Notes: Due flu/TDAP vaccine Patient to schedule appointment with Dr. DiBing PlumePatient  declined Colonoscopy

## 2021-12-24 ENCOUNTER — Telehealth: Payer: Self-pay

## 2021-12-24 NOTE — Telephone Encounter (Signed)
   Telephone encounter was:  Successful.  12/24/2021 Name: [name] MRN: [id number] DOB: Nov 20, 1950  [name] is a 71 y.o. year old female who is a primary care patient of Birdie Riddle, Aundra Millet, MD . The community resource team was consulted for assistance with  rx  Care guide performed the following interventions: Patient provided with information about care guide support team and interviewed to confirm resource needs. Pt is in need of assistance with RX. I have sent this referral to the embedded care team Follow Up Plan:  No further follow up planned at this time. The patient has been provided with needed resources.    [place], Care Management  [phone] [address], [place], [place] [address] Phone: [phone] Email: [email or web address]

## 2021-12-24 NOTE — Telephone Encounter (Signed)
Opened in error   [place], [place] [facility]  [phone] [address], [place], [place] [address] Phone: [phone] Email: [email or web address]

## 2021-12-29 ENCOUNTER — Ambulatory Visit (INDEPENDENT_AMBULATORY_CARE_PROVIDER_SITE_OTHER): Payer: Medicare HMO | Admitting: Pharmacist

## 2021-12-29 DIAGNOSIS — E1159 Type 2 diabetes mellitus with other circulatory complications: Secondary | ICD-10-CM

## 2021-12-29 DIAGNOSIS — I10 Essential (primary) hypertension: Secondary | ICD-10-CM

## 2021-12-29 NOTE — Patient Instructions (Signed)
Visit Information   Goals Addressed             This Visit's Progress    A1C <7.5%   On track    CARE PLAN ENTRY Current Barriers:  Diabetes: type 2; complicated by chronic medical conditions including high blood pressure, chronic kidney disease. Lab Results  Component Value Date   HGBA1C 8.4 (H) 08/05/2019   Lab Results  Component Value Date   CREATININE 1.55 (H) 08/05/2019   CREATININE 2.07 (H) 04/20/2019   CREATININE 2.13 (H) 04/19/2019  Current diabetes regimen: Glipizide XL 5 mg once daily   Insulin regular 70-3070 units with breakfast and 60 units with supper for BG above 90 Denies hypoglycemic symptoms, including dizziness, lightheadedness, shaking, sweating  Pharmacist Clinical Goal(s):  Over the next 180 days, patient will work with PharmD and primary care provider to address diabetes. A1c goal <7.5%2  Interventions: Comprehensive medication review performed, medication list updated in electronic medical record Inter-disciplinary care team collaboration  Patient Self Care Activities:  Patient will check blood glucose daily , document, and provide at future appointments Patient will take medications as prescribed Patient will contact provider with any episodes of hypoglycemia Patient will report any questions or concerns to provider  Initial goal documentation.         Pharmacist Clinical Goal(s):  Patient will achieve adherence to monitoring guidelines and medication adherence to achieve therapeutic efficacy contact provider office for questions/concerns as evidenced notation of same in electronic health record through collaboration with PharmD and provider.   Interventions: 1:1 collaboration with [name], MD regarding development and update of comprehensive plan of care as evidenced by provider attestation and co-signature Inter-disciplinary care team collaboration (see longitudinal plan of care) Comprehensive medication review performed;  medication list updated in electronic medical record  Diabetes (A1c goal <7%) -Uncontrolled -Current medications: Novolin 70/30 30 units BID with a meal Appropriate, Query Effective Farxiga '10mg'$  Appropriate, Query effective, -Medications previously tried: Glyxambi, Toujeo, glipizide -Current home glucose readings fasting glucose: 170s-180s in the morning post prandial glucose: > 200 at bedtime -Denies hypoglycemic/hyperglycemic symptoms -Current exercise: minimal, back pain is really holding her back -Educated on A1c and blood sugar goals; Prevention and management of hypoglycemic episodes; Benefits of routine self-monitoring of blood sugar; -She was recently taken off glipizide and started on Farxiga as she was having overnight low glucose.  Her blood sugar seems as if it has been creeping up since these changes.  Considered GLP-1 but she is on Reglan at the moment and complains of bloating so I would be hesitant to start this.  She may need to move to more of a basal bolus regimen to cover her meals because she is not consistent with her meals.  She has FU with endocrine next month.  Her most recent A1c reported at her last visit was 7.9 - big improvement from 9.9!  Will check at this visit and adjust insulin as needed.  Hypertension (BP goal <130/80)12/29/21 -Controlled, based on office BP -Current treatment: Hydralazine 50 mg three times daily Appropriate, Effective, safe, accessible Losartan 25 mg once dailyAppropriate, Effective, safe, accessible Torsemide 20 mg BID Appropriate, Effective, safe, accessible Amlodipine '10mg'$  dailyAppropriate, Effective, safe, accessible Carvedilol 6.'26mg'$  twice daily AAppropriate, Effective, safe, accessible -Current home readings: unsure of what it has been lately -Current exercise habits: no formal exercise - limited due to chronic pain - planing on getting spinal cord stimulator  -Reports some dizziness - have asked her to check her BP when she  feels  this. No changes to meds at this time, pending home BP monitoring will assess for hypotension with reported dizziness. No changes at this time - CMA to FU in 2 weeks on home BP.   Update 06/09/21 Patient reports she has not been taking any of her oral medication on regular basis.  She reports a BP of systolic 403K one time, unclear how accurate this was.  She denies any dizziness or HA.  We spent most of the visit talking about importance of adherence.  Patient agrees to start taking her medications more regularly and agrees to me having someone check in with her regularly to see if she is taking her medications. She gets mail order which is preferred so [facility] services were not an option. We cannot adjust BP meds if she is not taking what she is prescribed. Will have CMA call patient next week to ensure she is back on track with medications.  Osteoporosis (goal prevent fractures) -Controlled, not assessed -Reports to be using consistently without issue  -Current regimen: None -Reviewed side effects - no problems noted  -Last DEXA 03/2020 - osteoporosis  Hyperlipidemia: (LDL goal < 70) -Controlled, not assessed -Several cardiac risk factors  -Current treatment: Rosuvastatin 20 mg once daily (90 DS sent 4/22) -Reviewed adherence with patient - reports consistent use. Denies any recent side effects of problems -Educated on Cholesterol goals;  Benefits of statin for ASCVD risk reduction; -Recommended to continue current medication  -Per Dr Stanford Breed pt will need f/u imagining march 2023 on thoracic aortic aneursym   GERD (Goal: minimize symptoms, optimize medication safety) -Controlled, not assessed -Daily ASA -Some ongoing symptoms but feels controlled well overall. -Several questions about metoclopramide and pantoprazole - reviewed at length with patient, all questions answered. -Current treatment: Pantoprazole 40 mg twice daily - 1 tablet twice daily  Metoclopramide 10 mg four  times daily  -Dietary triggers reviewed at length  -No problems with current regimen per patient. No side effects noted -Recommended continue current medications   Patient Goals/Self-Care Activities Patient will:  - target a minimum of 150 minutes of moderate intensity exercise weekly engage in dietary modifications by minimizing snacking   Follow Up Plan: Telephone follow up appointment with care management team member scheduled for: Medication Assistance: None required.  Patient affirms current coverage meets needs.         The patient verbalized understanding of instructions, educational materials, and care plan provided today and DECLINED offer to receive copy of patient instructions, educational materials, and care plan.  Telephone follow up appointment with pharmacy team member scheduled for: 3 months  Edythe Clarity, [place], PharmD Clinical Pharmacist  [facility] [phone]

## 2022-01-04 IMAGING — US US RENAL
1 series · 14 of 25 positions shown · non-contrast
Comparison: Prior CT from 12/25/2017.

CLINICAL DATA: Initial evaluation for chronic kidney disease, stage
G3b/A1.

EXAM:
RENAL / URINARY TRACT ULTRASOUND COMPLETE

[Series 1: us renal · 0.23mm/px · 14 of 44 slices shown]
[im 1/44]
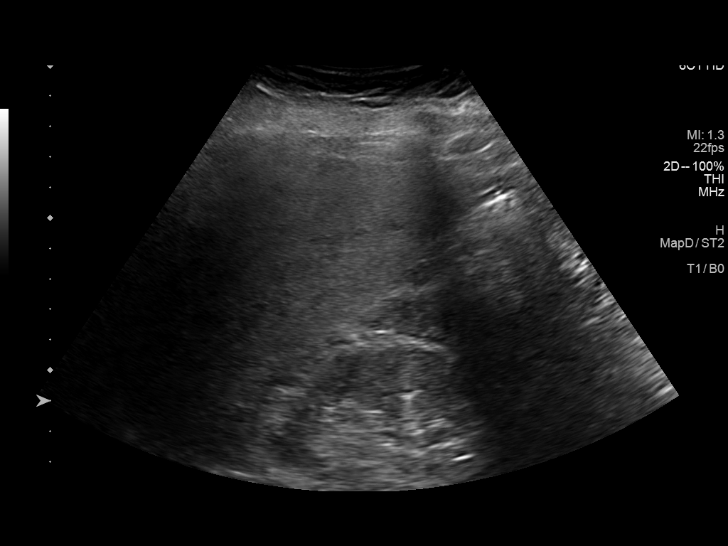
[im 4/44]
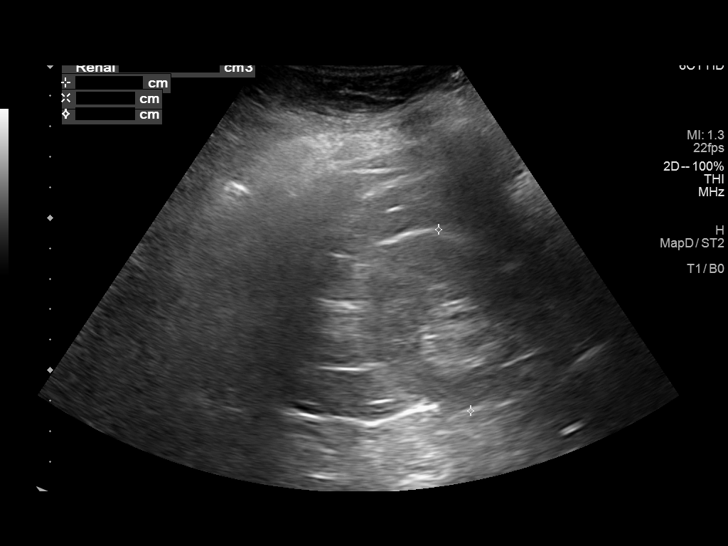
[im 8/44]
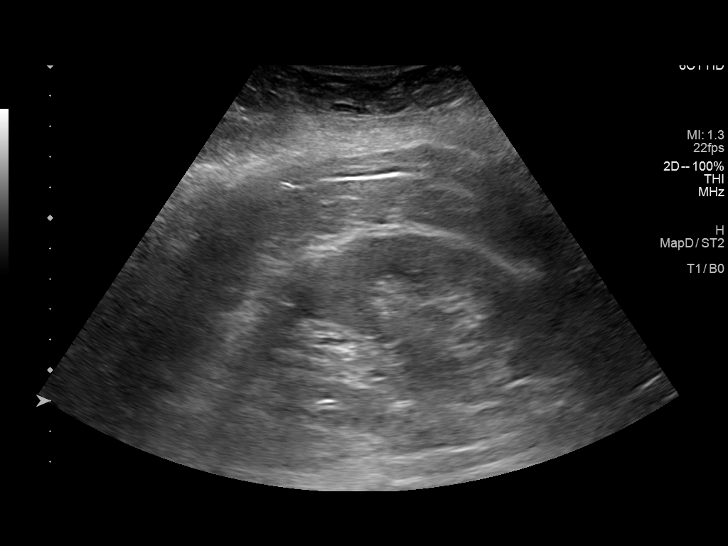
[im 11/44]
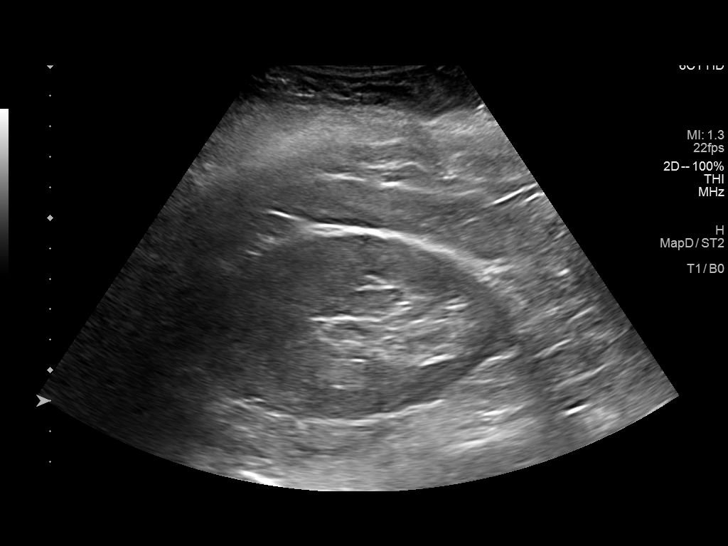
[im 15/44]
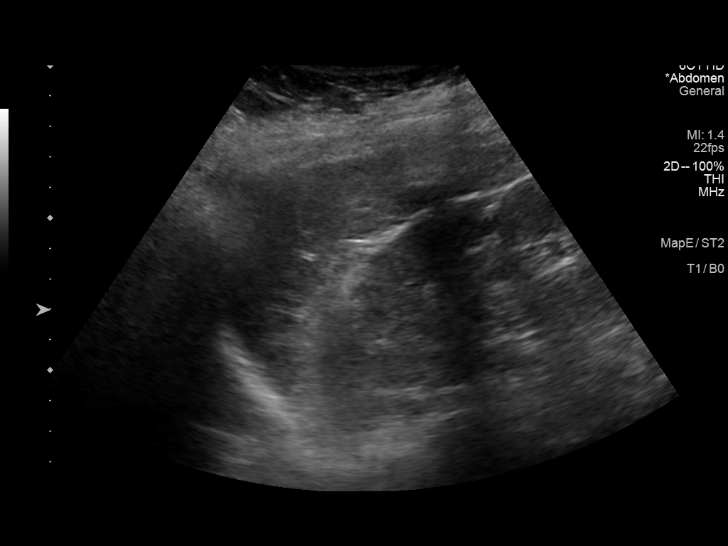
[im 17/44]
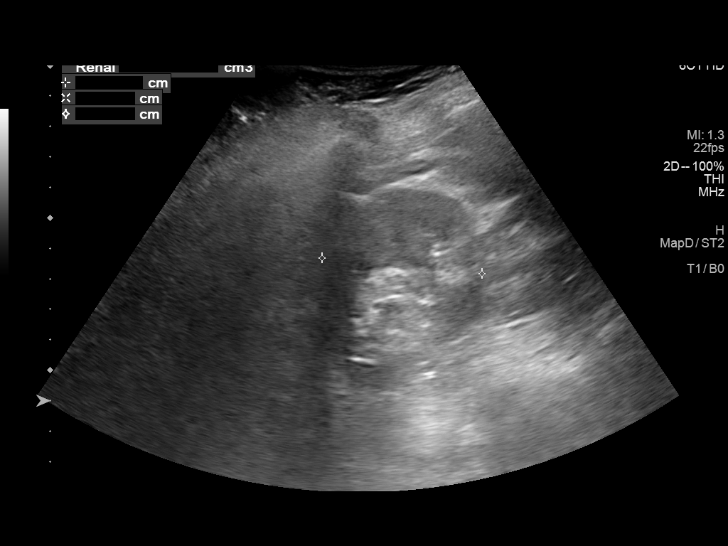
[im 20/44]
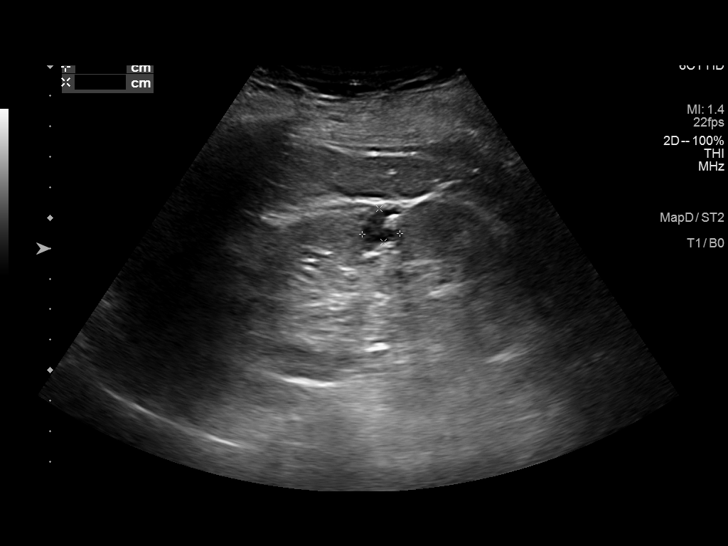
[im 24/44]
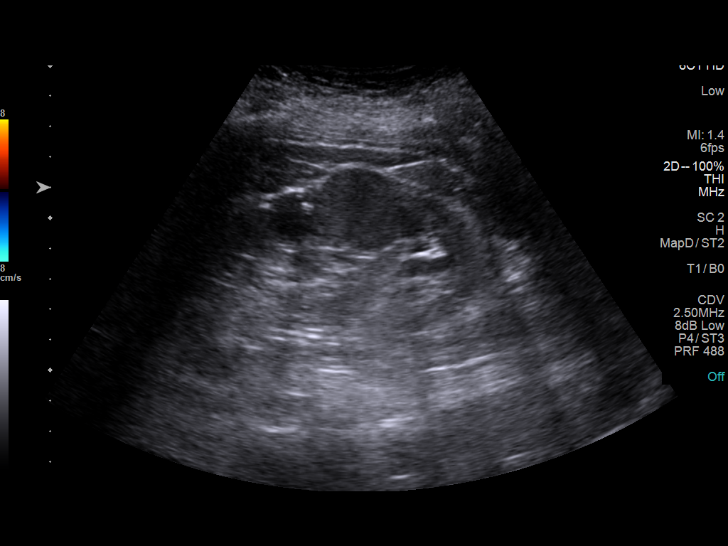
[im 27/44]
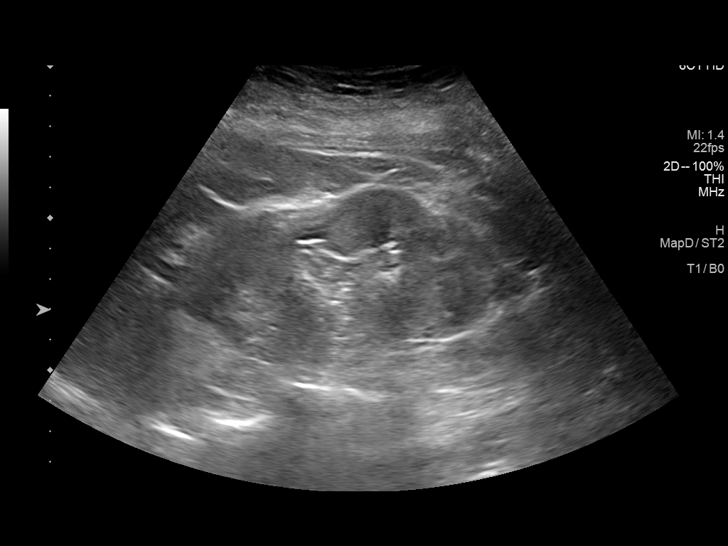
[im 29/44]
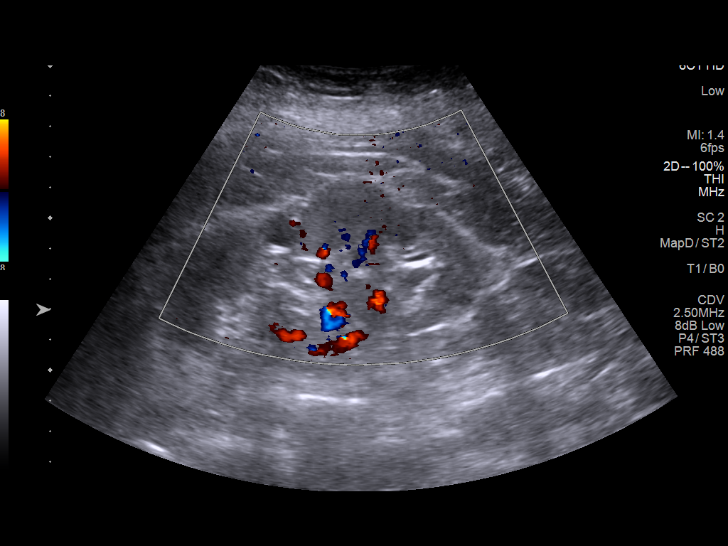
[im 33/44]
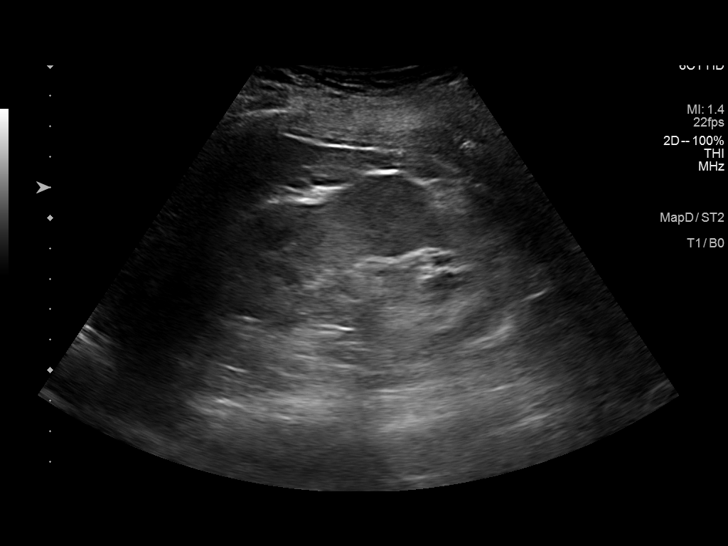
[im 36/44]
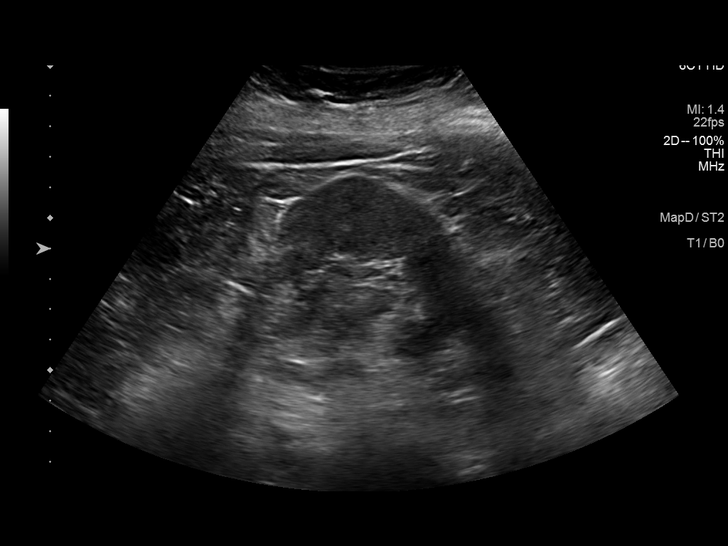
[im 40/44]
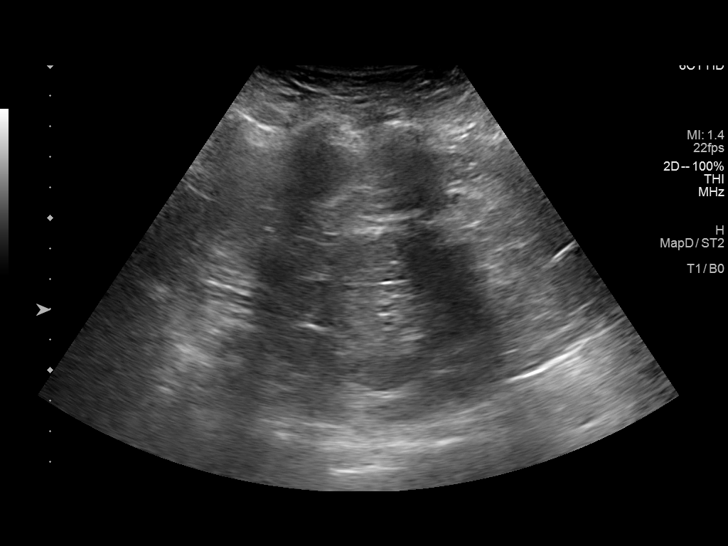
[im 44/44]
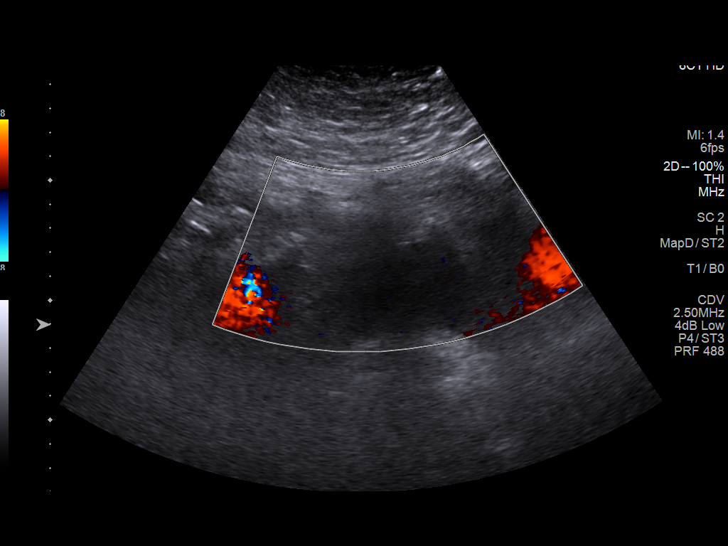

[14 of 25 positions shown; findings below may reference images not displayed]

FINDINGS: Right Kidney:

Renal measurements: 11.7 x 6.3 x 6.0 cm = volume: 234.8 mL.
Increased echogenicity within the renal parenchyma. No
nephrolithiasis or hydronephrosis. No focal renal mass.

Left Kidney:

Renal measurements: 10.6 x 6.1 x 5.3 cm = volume: 178.1 mL.
Diffusely increased echogenicity within the renal parenchyma. No
nephrolithiasis or hydronephrosis. 1.2 x 1.1 x 1.0 cm complex cyst
with internal calcification present at the mid-upper pole, stable
from prior CT, consistent with a benign finding.

Bladder:

Appears normal for degree of bladder distention.

Other:

None.
IMPRESSION: 1. Increased echogenicity within the renal parenchyma, consistent
with medical renal disease.
2. No hydronephrosis.
3. 1.2 cm complex cyst with internal calcification at the interpolar
left kidney, stable from previous.

## 2022-01-06 ENCOUNTER — Ambulatory Visit (INDEPENDENT_AMBULATORY_CARE_PROVIDER_SITE_OTHER): Payer: Medicare HMO | Admitting: Family Medicine

## 2022-01-06 ENCOUNTER — Encounter: Payer: Self-pay | Admitting: Family Medicine

## 2022-01-06 VITALS — BP 122/60 | HR 54 | Temp 98.1°F | Ht 64.0 in | Wt 191.8 lb

## 2022-01-06 DIAGNOSIS — I959 Hypotension, unspecified: Secondary | ICD-10-CM

## 2022-01-06 DIAGNOSIS — Z23 Encounter for immunization: Secondary | ICD-10-CM

## 2022-01-06 DIAGNOSIS — R42 Dizziness and giddiness: Secondary | ICD-10-CM

## 2022-01-06 NOTE — Patient Instructions (Signed)
Follow up in 6 weeks to recheck BP Make sure you drink enough water to keep your blood pressure stable DECREASE the Carvedilol to 1/2 tab twice a day DECREASE the Hydralazine from 3x/day --> 2x/day Change positions slowly to allow yourself time to adjust Call with any questions or concerns- particularly if the dizziness does not improve Hang in there!!!

## 2022-01-06 NOTE — Progress Notes (Signed)
   Subjective:    Patient ID: SELENA SWAMINATHAN, female    DOB: 02-Mar-1951, 71 y.o.   MRN: [id number]  HPI 'dizzy headed'- pt reports that as long as she is seated she is 'ok', but when she stands up she feels dizzy and off balance.  Sxs started 'over a month' ago.  Feels like sxs are worsening.  Initially had HAs but these have improved.  No particular time of day, 'any time I stand up'   Review of Systems For ROS see HPI     Objective:   Physical Exam Vitals reviewed.  Constitutional:      General: She is not in acute distress.    Appearance: She is well-developed. She is obese.  HENT:     Head: Normocephalic and atraumatic.  Eyes:     Extraocular Movements:     Right eye: Normal extraocular motion and no nystagmus.     Left eye: Normal extraocular motion and no nystagmus.     Conjunctiva/sclera: Conjunctivae normal.     Pupils: Pupils are equal, round, and reactive to light.  Neck:     Thyroid: No thyromegaly.  Cardiovascular:     Rate and Rhythm: Normal rate and regular rhythm.     Heart sounds: Murmur (II/VI SEM) heard.  Pulmonary:     Effort: Pulmonary effort is normal. No respiratory distress.     Breath sounds: Normal breath sounds.  Abdominal:     General: There is no distension.     Palpations: Abdomen is soft.     Tenderness: There is no abdominal tenderness.  Musculoskeletal:     Cervical back: Normal range of motion and neck supple.     Right lower leg: 1+ Edema present.     Left lower leg: 1+ Edema present.  Lymphadenopathy:     Cervical: No cervical adenopathy.  Skin:    General: Skin is warm and dry.  Neurological:     Mental Status: She is alert and oriented to person, place, and time.     Cranial Nerves: No cranial nerve deficit.  Psychiatric:        Mood and Affect: Mood normal.        Behavior: Behavior normal.           Assessment & Plan:   Dizziness- new.  Given pt's low diastolic BP today (60) and low systolic pressure in July (108) I  suspect that her dizziness upon standing is due to orthostatic hypotension.  Pt is on multiple BP meds- Amlodipine '10mg'$  daily, Coreg 6.'25mg'$  BID, Hydralazine '50mg'$  TID, Losartan '25mg'$  daily, Torsemide '20mg'$  BID.  Will decrease Coreg to 1/2 tab BID as this is lowering both BP and HR.  Will decrease Hydralazine to BID.  She will return in 6 weeks to recheck dizziness and BP.  If still dizzy will proceed w/ additional workup and likely neurology referral.  Pt expressed understanding and is in agreement w/ plan.

## 2022-01-08 DIAGNOSIS — E1159 Type 2 diabetes mellitus with other circulatory complications: Secondary | ICD-10-CM

## 2022-01-08 DIAGNOSIS — M810 Age-related osteoporosis without current pathological fracture: Secondary | ICD-10-CM

## 2022-01-08 DIAGNOSIS — E785 Hyperlipidemia, unspecified: Secondary | ICD-10-CM | POA: Diagnosis not present

## 2022-01-08 DIAGNOSIS — I10 Essential (primary) hypertension: Secondary | ICD-10-CM

## 2022-01-08 DIAGNOSIS — Z794 Long term (current) use of insulin: Secondary | ICD-10-CM

## 2022-01-10 ENCOUNTER — Telehealth: Payer: Self-pay | Admitting: Pharmacist

## 2022-01-10 NOTE — Progress Notes (Signed)
  Chronic Care Management Pharmacy Assistant   Name: Genola J Lardner  MRN: [id number] DOB: 11/18/1950   Reason for Encounter: Disease State - Hypertension Call      Recent office visits:  01/06/22 Katherine Tabori, MD - Family Medicine - Dizziness- Patient received Flu vaccine. DECREASE the Carvedilol to 1/2 tab twice a day. DECREASE the Hydralazine from 3x/day --> 2x/day. Follow up in 6 weeks for blood pressure recheck.   Recent consult visits:  None noted.   Hospital visits:  None in previous 6 months  Medications: Outpatient Encounter Medications as of 01/10/2022  Medication Sig   acetaminophen (TYLENOL) 500 MG tablet Take 500-1,000 mg by mouth every 6 (six) hours as needed for moderate pain, headache or mild pain.   Alcohol Swabs (B-D SINGLE USE SWABS REGULAR) PADS Use as directed.   amLODipine (NORVASC) 10 MG tablet Take 1 tablet (10 mg total) by mouth daily.   aspirin 81 MG tablet Take 81 mg by mouth daily.   Blood Glucose Monitoring Suppl (TRUE METRIX AIR GLUCOSE METER) w/Device KIT 1 kit by Does not apply route 3 (three) times daily.   carvedilol (COREG) 6.25 MG tablet TAKE 1 TABLET TWICE DAILY WITH MEALS   dapagliflozin propanediol (FARXIGA) 10 MG TABS tablet Take by mouth daily.   famotidine (PEPCID) 40 MG tablet TAKE 1 TABLET BY MOUTH EVERY DAY   ferrous gluconate (FERGON) 324 MG tablet TAKE 1 TABLET (324 MG TOTAL) BY MOUTH 2 (TWO) TIMES DAILY WITH A MEAL.   glucose blood (TRUE METRIX BLOOD GLUCOSE TEST) test strip Use as instructed   hydrALAZINE (APRESOLINE) 50 MG tablet Take 1 tablet (50 mg total) by mouth 3 (three) times daily.   levothyroxine (SYNTHROID) 100 MCG tablet Take 1 tablet (100 mcg total) by mouth daily.   losartan (COZAAR) 25 MG tablet Take 1 tablet (25 mg total) by mouth daily.   metoCLOPramide (REGLAN) 10 MG tablet TAKE 1 TABLET FOUR TIMES DAILY   NOVOLIN 70/30 (70-30) 100 UNIT/ML injection INJECT 45 UNITS INTO THE SKIN 2 (TWO) TIMES DAILY WITH A  MEAL.   ondansetron (ZOFRAN) 4 MG tablet Take 1 tablet (4 mg total) by mouth every 8 (eight) hours as needed for nausea or vomiting.   pantoprazole (PROTONIX) 40 MG tablet Take 1 tablet (40 mg total) by mouth 2 (two) times daily.   polyethylene glycol (MIRALAX / GLYCOLAX) packet Take 17 g by mouth daily as needed for moderate constipation (MIX AND DRINK).   rosuvastatin (CRESTOR) 20 MG tablet TAKE 1 TABLET AT BEDTIME   SUMAtriptan (IMITREX) 50 MG tablet TAKE 1 TAB EVERY 2 HRS AS NEEDED FOR MIGRAINE. MAY REPEAT IN 2 HOURS IF HEADACHE PERSISTS OR RECURS.   torsemide (DEMADEX) 20 MG tablet Take 1 tablet (20 mg total) by mouth 2 (two) times daily.   TRUEplus Lancets 33G MISC Use as directed   No facility-administered encounter medications on file as of 01/10/2022.    Current antihypertensive regimen:  Hydralazine 50 mg 2 times daily  Losartan 25 mg once daily Torsemide 20 mg BID  Amlodipine 10mg daily Carvedilol 6.26mg 1/2 twice daily  How often are you checking your Blood Pressure?  Patient reported checking blood pressures every morning   Current home BP readings: 113/36 (yesterday)    What recent interventions/DTPs have been made by any provider to improve Blood Pressure control since last [name] Visit:  Patient instructed to DECREASE the Carvedilol to 1/2 tab twice a day and DECREASE the Hydralazine from 3x/day -->   2x/day on 01/06/22.   Any recent hospitalizations or ED visits since last visit with [name]? Patient did not have any hospitalizations or ED visits since last visit with [name]    What diet changes have been made to improve Blood Pressure Control?  Patient reported she does try and limit her salt intake.   What exercise is being done to improve your Blood Pressure Control?   Patient reported she remains active as she can because her back has been bothering her.    Adherence Review: Is the patient currently on ACE/ARB medication? Yes Does the patient have >5 day gap between last  estimated fill dates? No     Care Gaps   AWV: done 06/25/20 Colonoscopy: overdue 06/09/13 upper EGD done 08/09/19 DM Eye Exam:  done 04/20/21 DM Foot Exam: done 11/04/20 Microalbumin: overdue HbgAIC: done 10/28/20 (6.9) due 04/30/21 DEXA: done 03/25/20 Mammogram: done 08/25/20     Star Rating Drugs: rosuvastatin (CRESTOR) 20 MG tablet - last filled 08/11/21 90 days (uses mail order) losartan (COZAAR) 25 MG tablet - last filled 08/11/21 90 days (uses mail order) glipiZIDE (GLUCOTROL XL) 5 MG 24 hr tablet - last filled 08/11/21 90 day (uses mail order)    Future Appointments  Date Time Provider Department Center  02/08/2022 11:00 AM Reardon, Whitney J, NP [facility] None  02/17/2022 11:00 AM Tabori, Katherine E, MD [facility]  03/30/2022  3:45 PM [facility] C[facility] PHARMACIST [facility]  01/05/2023  3:00 PM [facility] HEALTH COACH [facility]   Patient stated she ordered Omega XL to take for joint pain and inflammation. She wanted to make sure it was safe to take with her current medications. Message sent to [name] and will advise patient.   Liza Showfety, CCMA Clinical Pharmacist Assistant  [phone]   

## 2022-01-17 IMAGING — DX DG BONE SURVEY MET
10 series · 10 of 10 positions shown · non-contrast
Comparison: Chest x-ray 04/19/2019. CT chest 04/15/2019. Lumbar
spine 11/23/2018. CT abdomen 12/25/2017.

CLINICAL DATA: Monoclonal paraproteinemia.

EXAM:
METASTATIC BONE SURVEY

[skull lat]
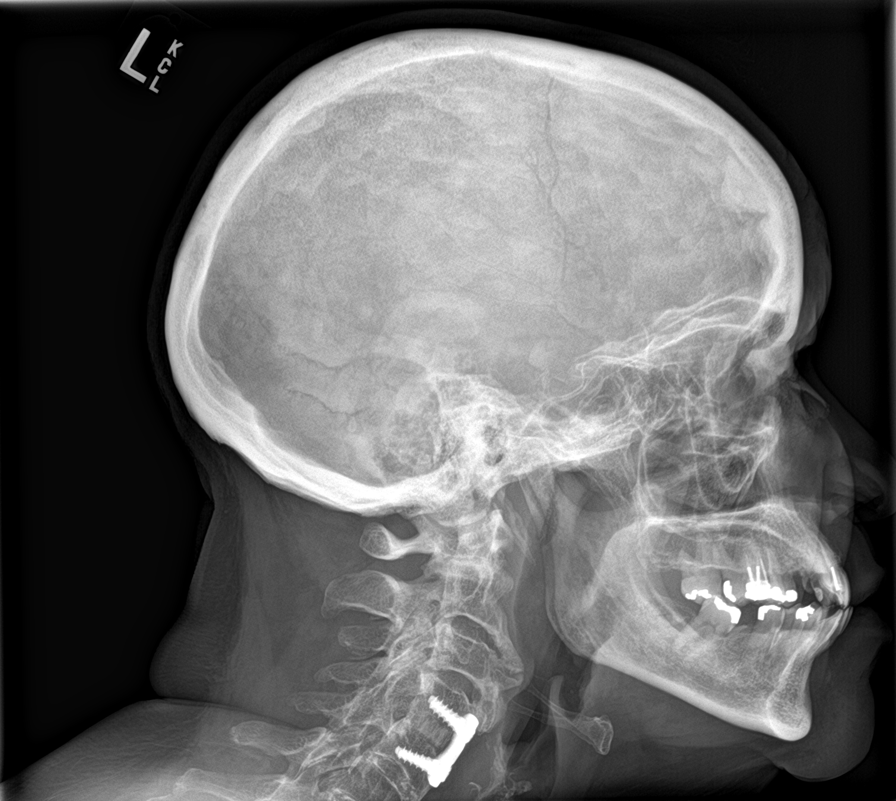

[shoulder ap (1 of 2)]
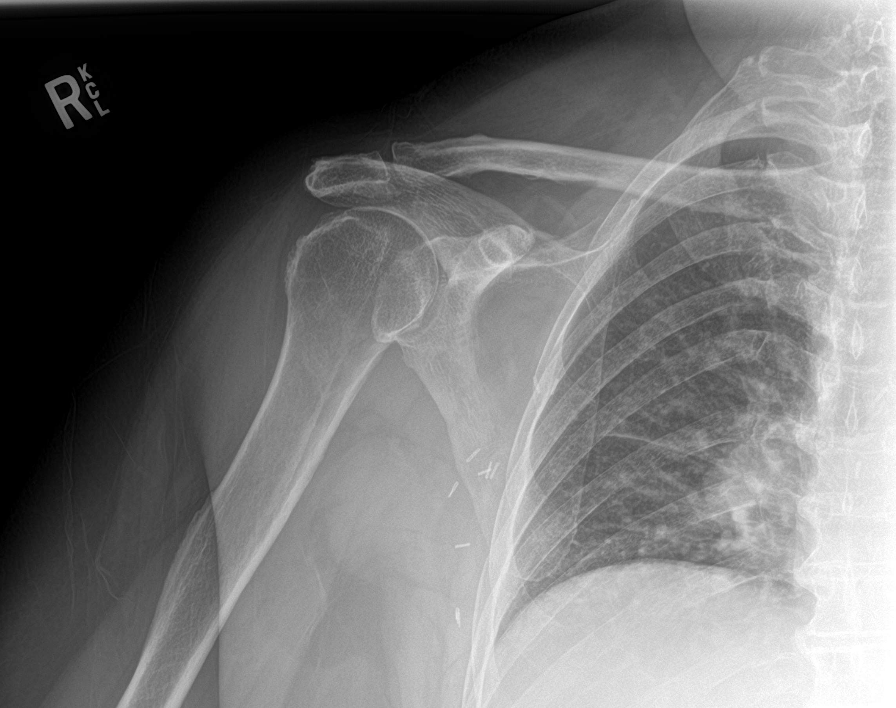

[shoulder ap (2 of 2)]
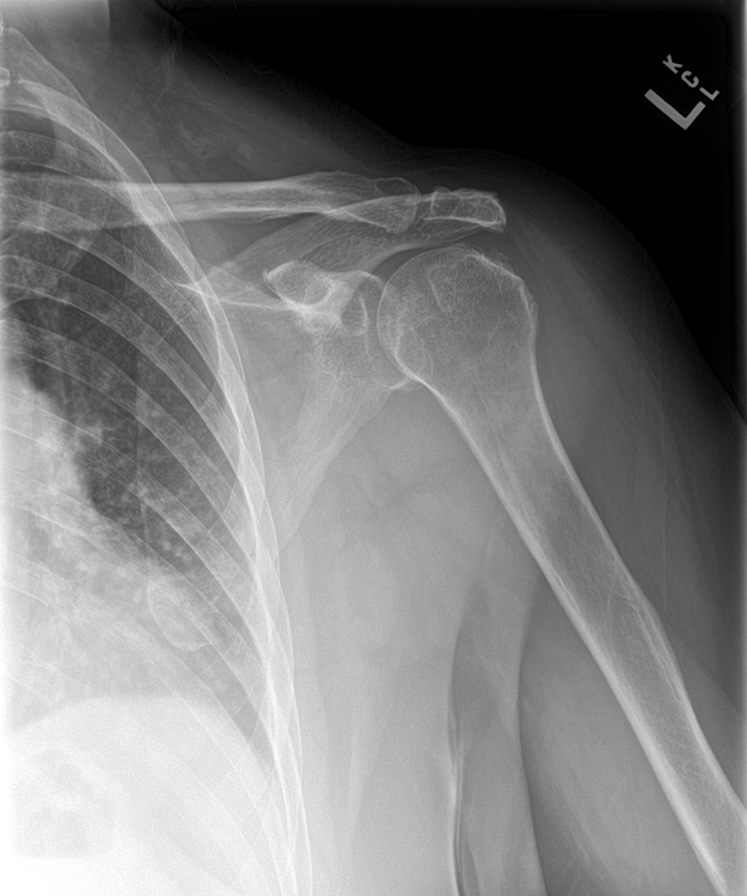

[humerus ap (1 of 2)]
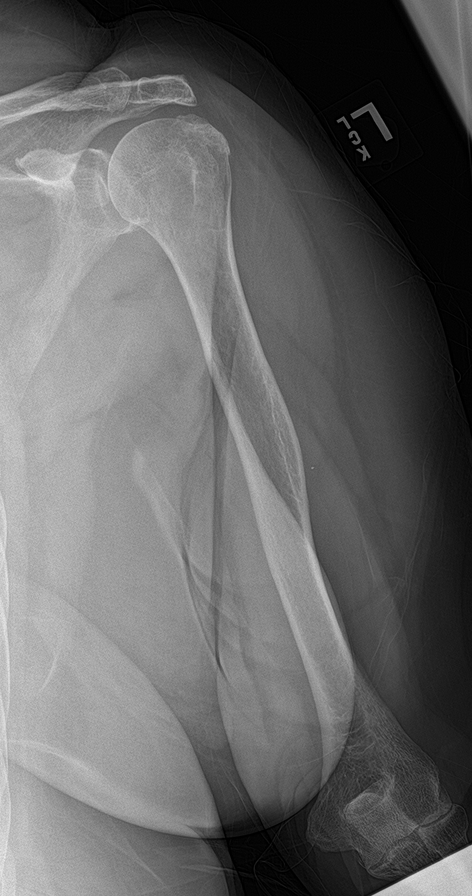

[humerus ap (2 of 2)]
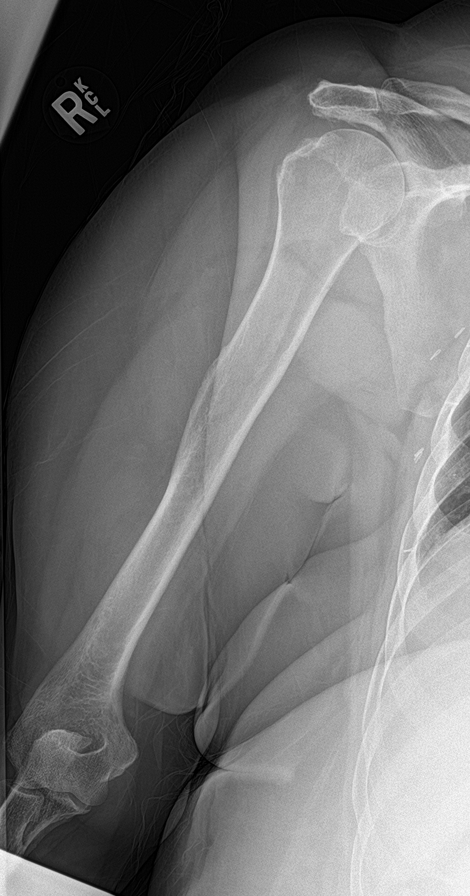

[forearm ap (1 of 2)]
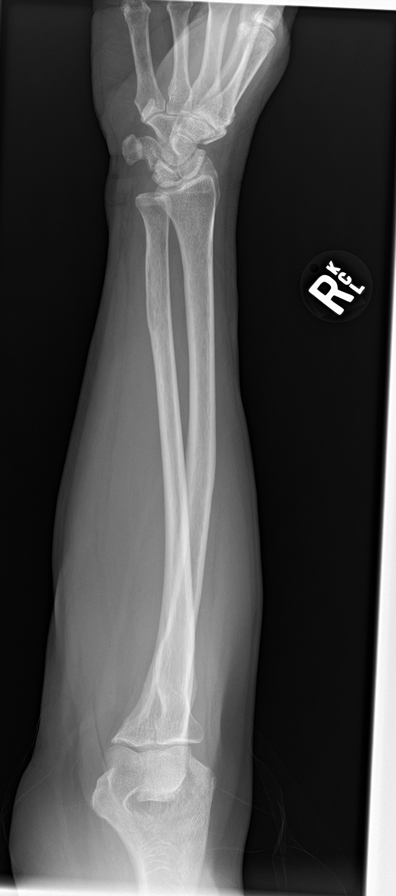

[forearm ap (2 of 2)]
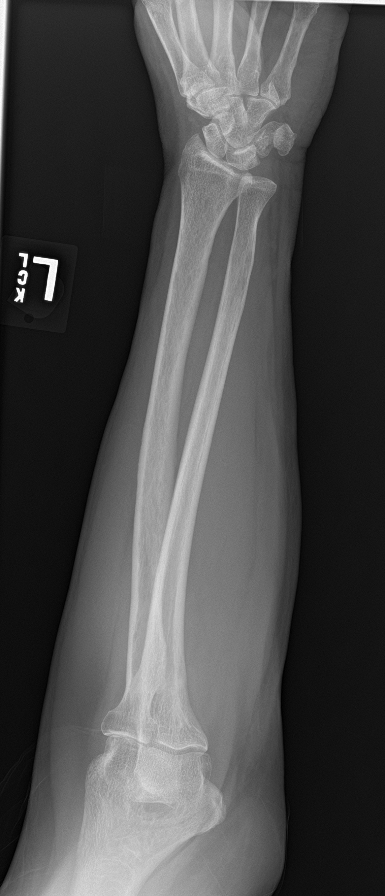

[c-spine ap]
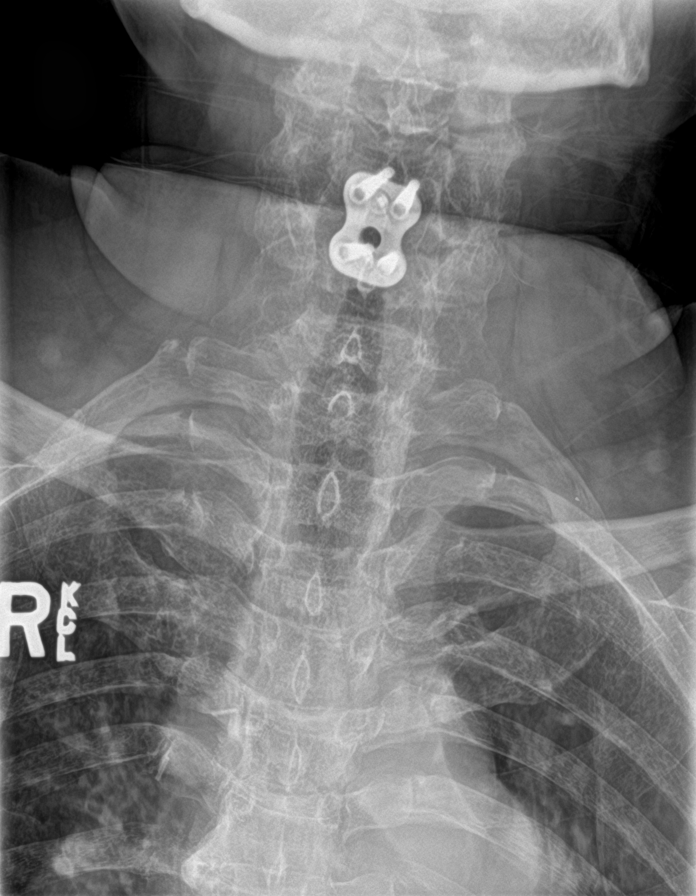

[c-spine lat]
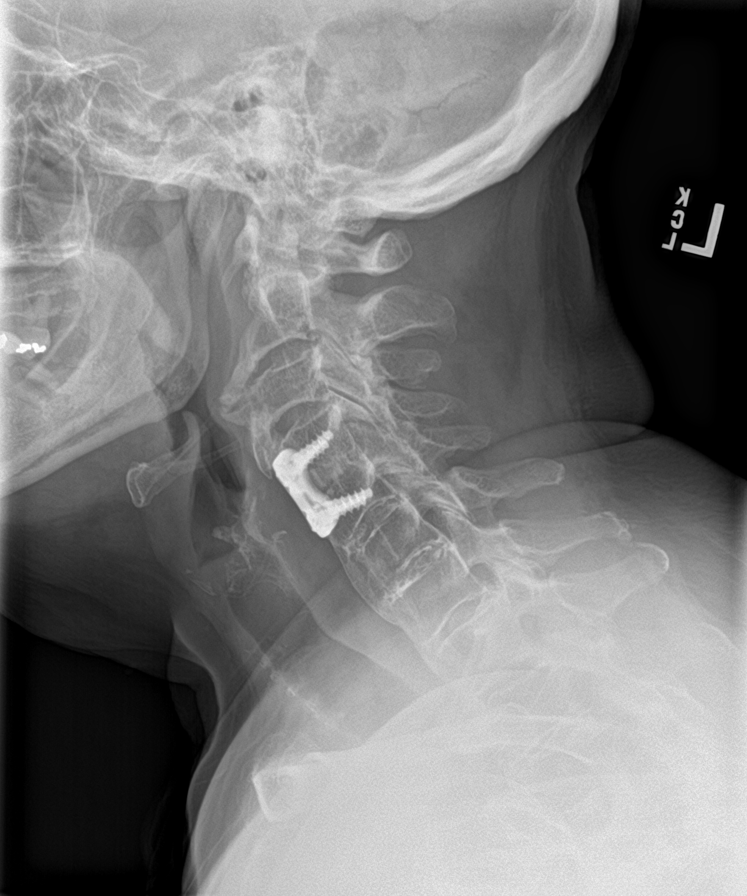

[t-spine ap]
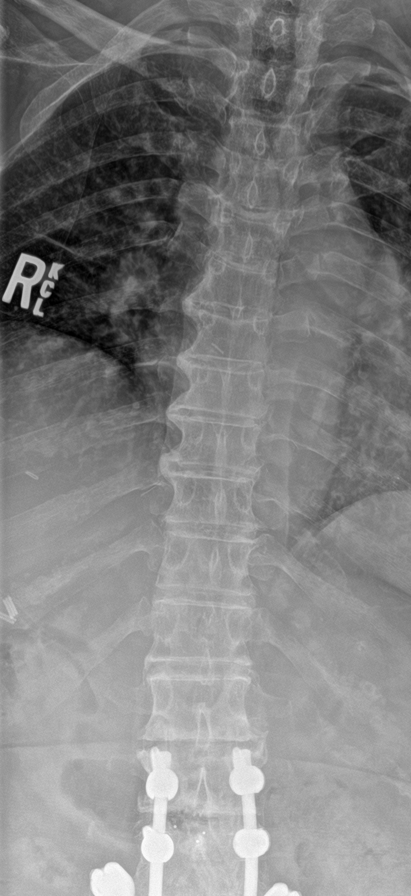

[10 of 10 positions shown; findings below may reference images not displayed]

FINDINGS: Standard imaging of the axial and appendicular skeleton performed.
Surgical clips noted over the chest and abdomen. No acute
cardiopulmonary disease or acute intra-abdominal abnormality
identified. Diffuse osteopenia. No lytic or sclerotic lesions are
noted to suggest myeloma or metastatic disease. Diffuse degenerative
change noted about the axial and appendicular skeleton. Prior
cervical and lumbar spine fusion. Prior right knee replacement.
Postsurgical changes right ankle. Aortic and peripheral vascular
calcification.
IMPRESSION: No evidence of lytic or sclerotic lesions to suggest myeloma or
metastatic disease.

## 2022-01-23 IMAGING — CT CT BIOPSY AND ASPIRATION BONE MARROW
1 series · 16 of 23 positions shown, 20 images · non-contrast
Comparison: none

INDICATION: 69-year-old with monoclonal paraproteinemia.

[Series 2: i-spiral 5.0 b40f · axial · 0.98mm/px · z∈[-137,-67]mm · 16 of 23 slices shown, 20 images]
[im 2/23  mediastinal]
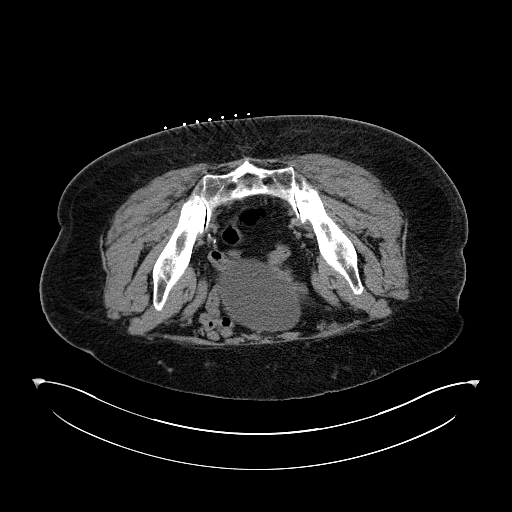
[im 2/23  lung]
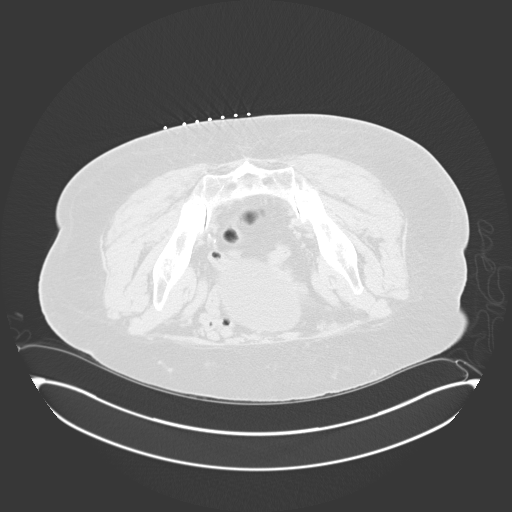
[im 3/23  lung]
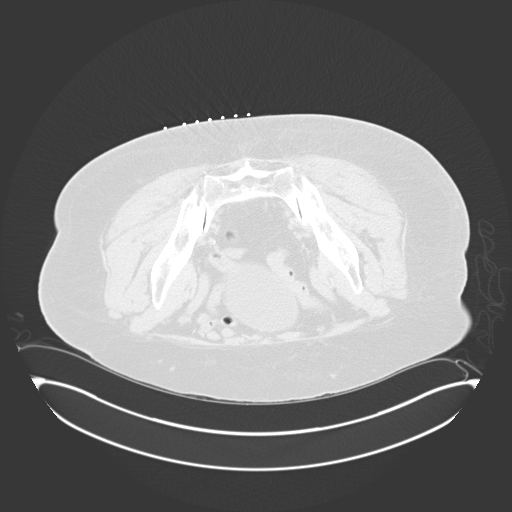
[im 5/23  lung]
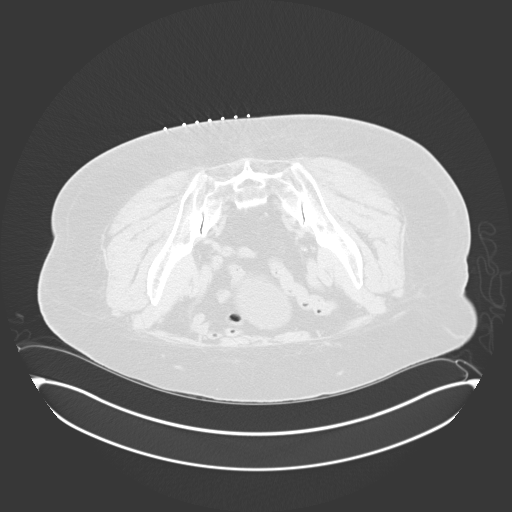
[im 6/23  lung]
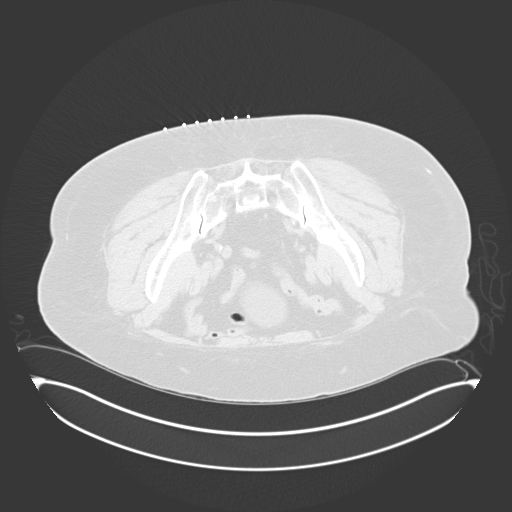
[im 7/23  mediastinal]
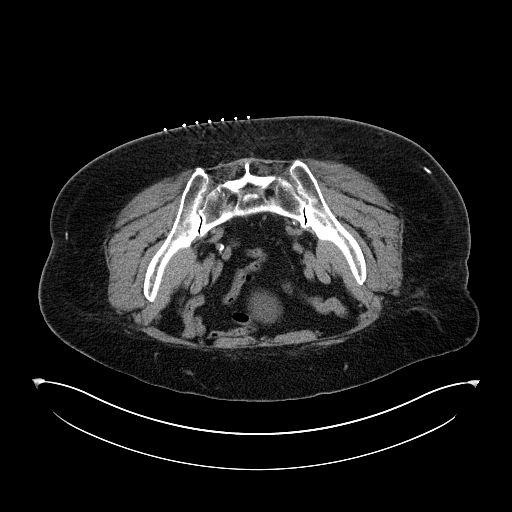
[im 7/23  lung]
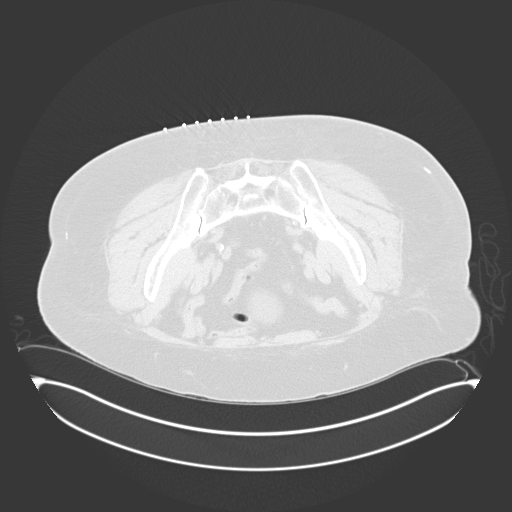
[im 9/23  lung]
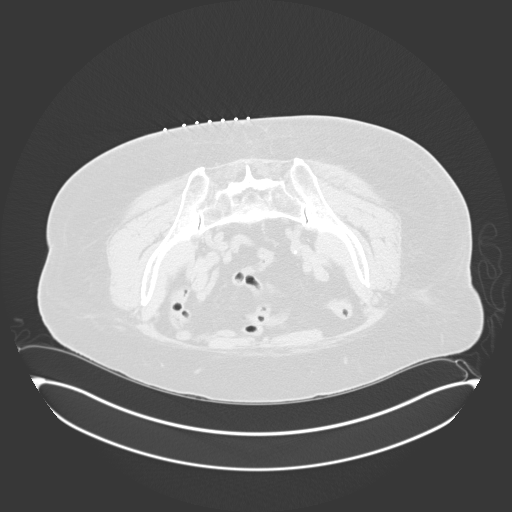
[im 10/23  lung]
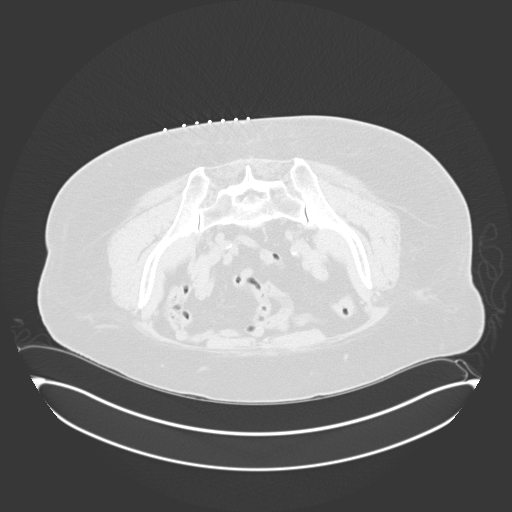
[im 11/23  lung]
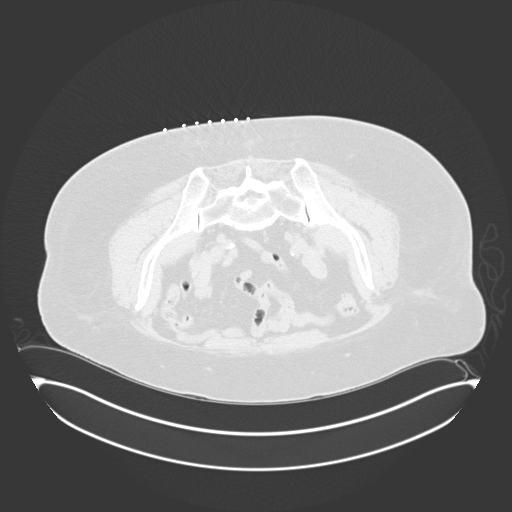
[im 13/23  mediastinal]
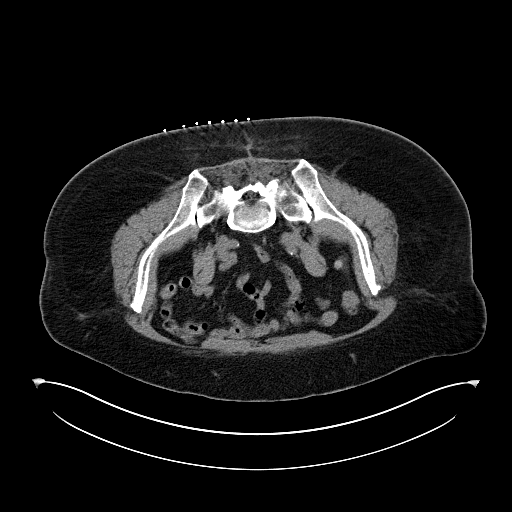
[im 13/23  lung]
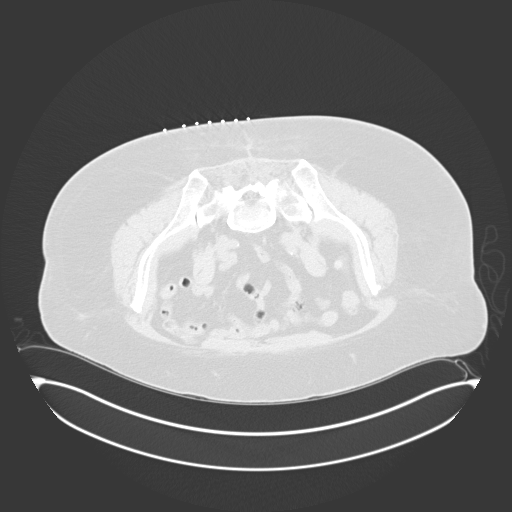
[im 14/23  lung]
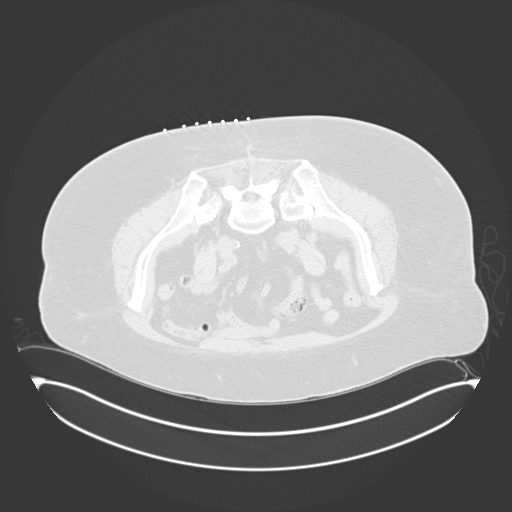
[im 15/23  lung]
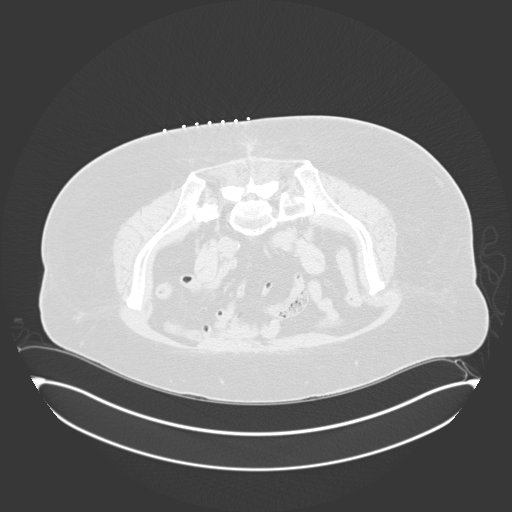
[im 17/23  lung]
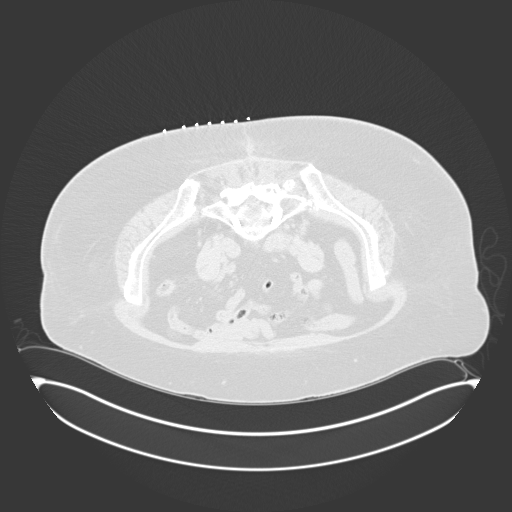
[im 18/23  mediastinal]
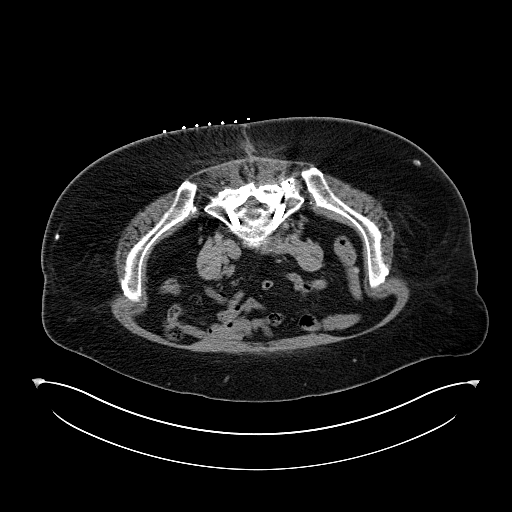
[im 18/23  lung]
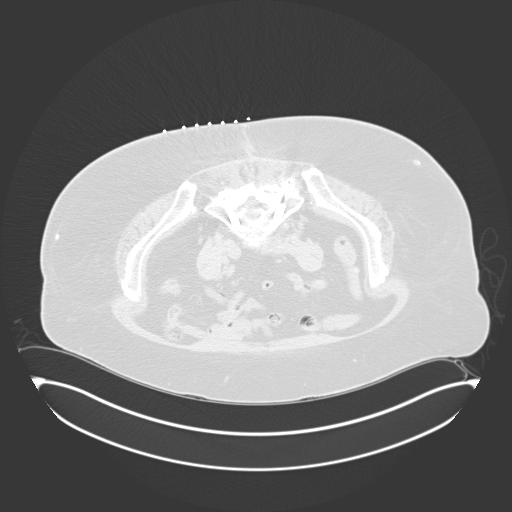
[im 19/23  lung]
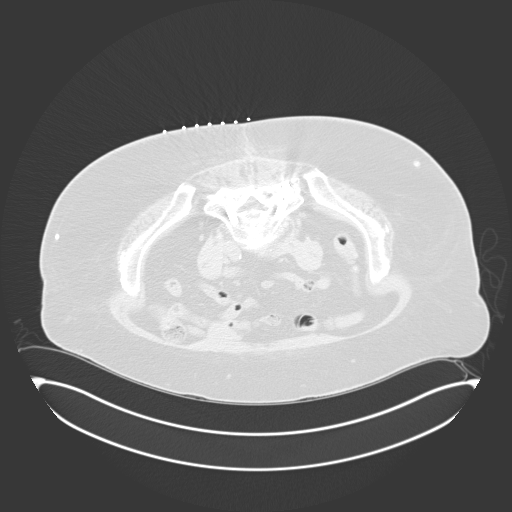
[im 21/23  lung]
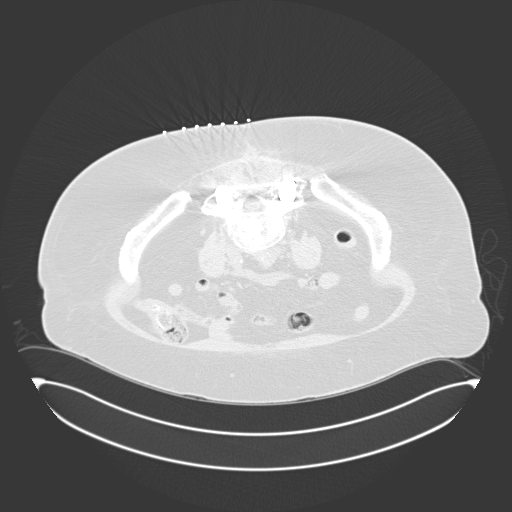
[im 22/23  lung]
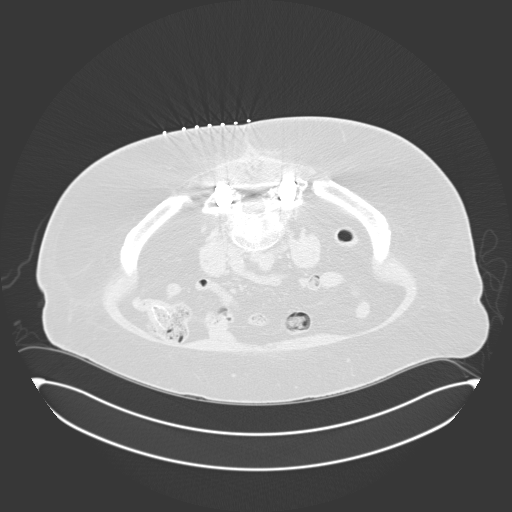

[16 of 23 positions shown; findings below may reference images not displayed]

EXAM:
CT GUIDED BONE MARROW ASPIRATES AND BIOPSY

MEDICATIONS:
None.

ANESTHESIA/SEDATION:
Fentanyl 100 mcg IV; Versed 2.0 mg IV

Moderate Sedation Time:  10 minutes

The patient was continuously monitored during the procedure by the
interventional radiology nurse under my direct supervision.

COMPLICATIONS:
None immediate.

PROCEDURE:
The procedure was explained to the patient. The risks and benefits
of the procedure were discussed and the patient's questions were
addressed. Informed consent was obtained from the patient. The
patient was placed prone on CT table. Images of the pelvis were
obtained. The right side of back was prepped and draped in sterile
fashion. The skin and right posterior ilium were anesthetized with
1% lidocaine. 11 gauge bone needle was directed into the right ilium
with CT guidance. Two aspirates and one core biopsy were obtained.
Bandage placed over the puncture site.
IMPRESSION: CT guided bone marrow aspiration and core biopsy.

## 2022-01-23 IMAGING — CT CT BIOPSY
1 of 2 series · 15 of 26 positions shown, 19 images · non-contrast
Comparison: none

INDICATION: 69-year-old with monoclonal paraproteinemia.

[Series 2: i-spiral 5.0 b40f · axial · 0.98mm/px · z∈[-137,-67]mm · 15 of 23 slices shown, 19 images]
[im 2/23  mediastinal]
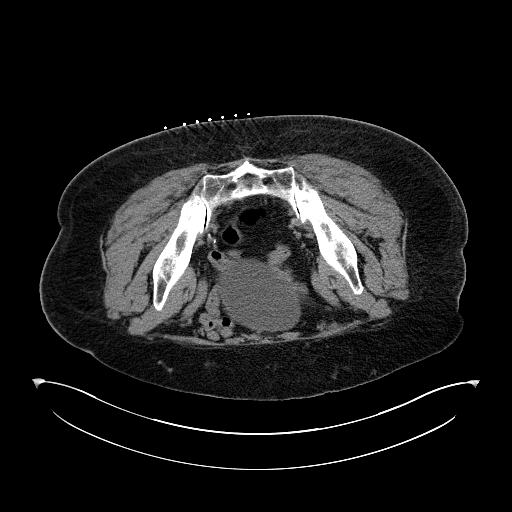
[im 2/23  lung]
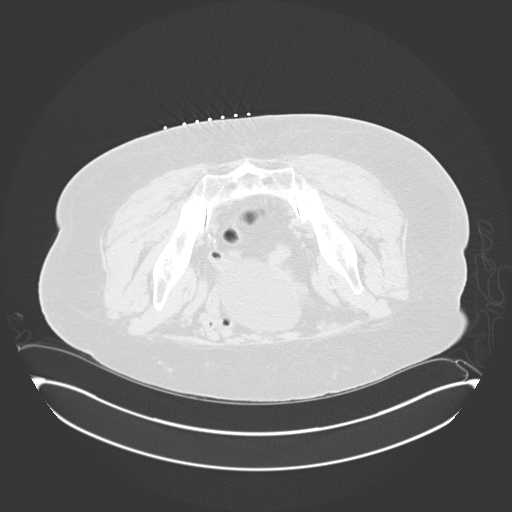
[im 4/23  lung]
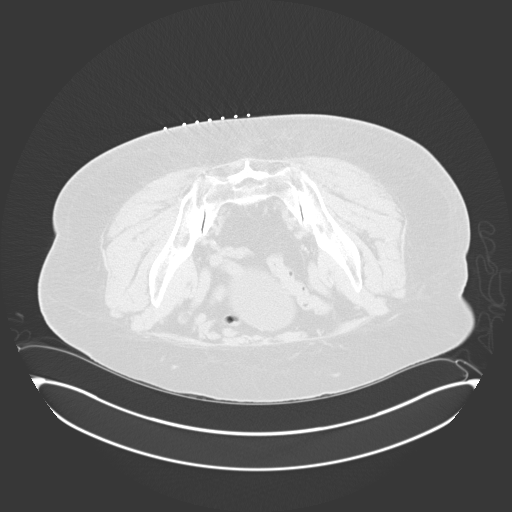
[im 5/23  lung]
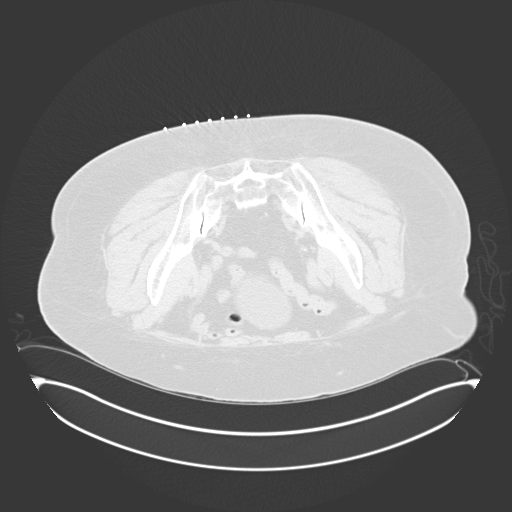
[im 6/23  lung]
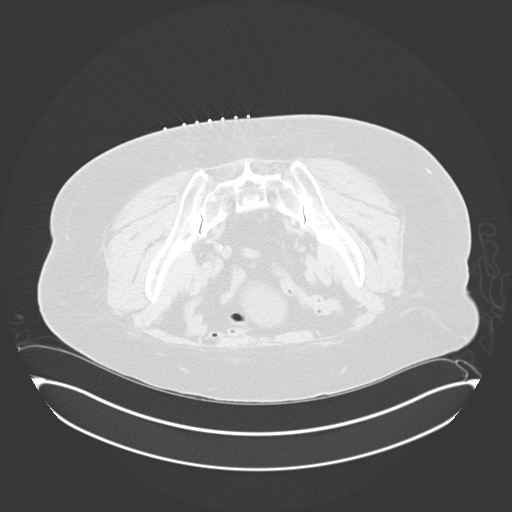
[im 8/23  mediastinal]
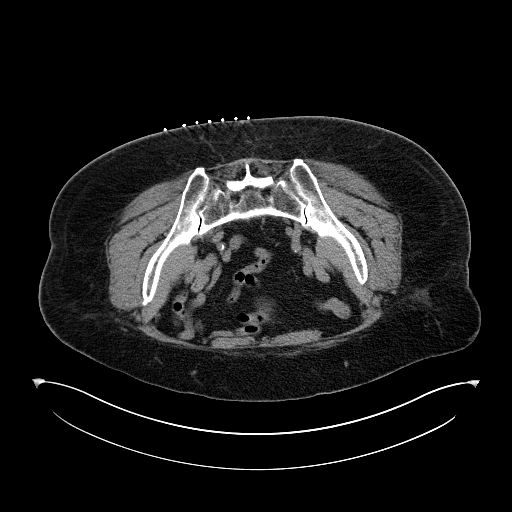
[im 8/23  lung]
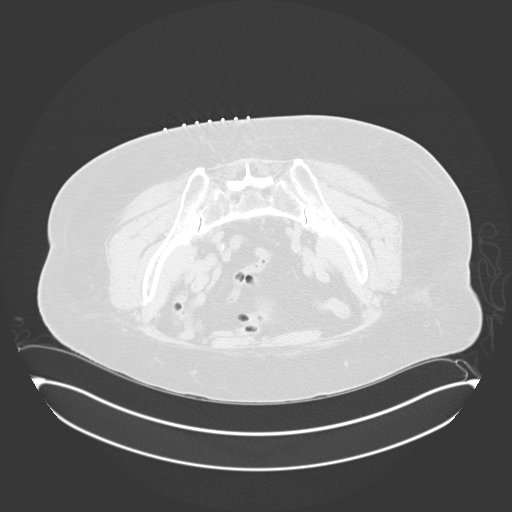
[im 9/23  lung]
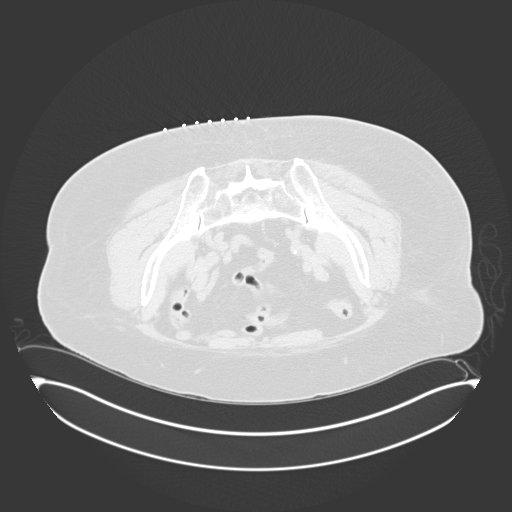
[im 11/23  lung]
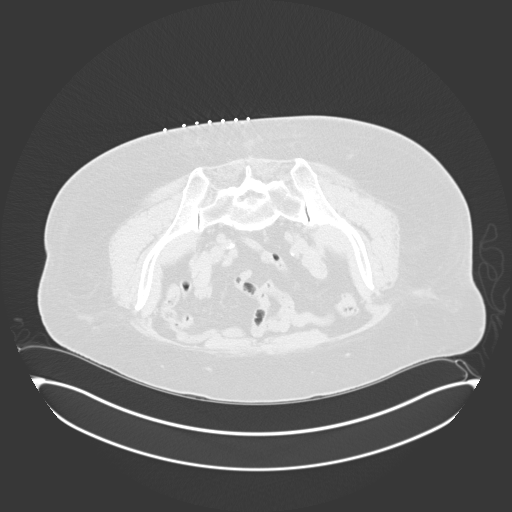
[im 12/23  lung]
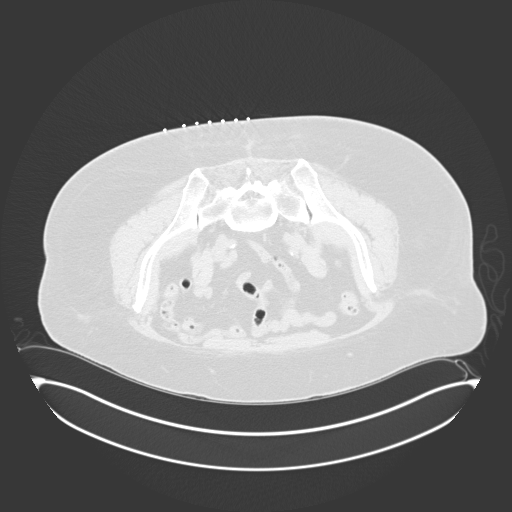
[im 13/23  mediastinal]
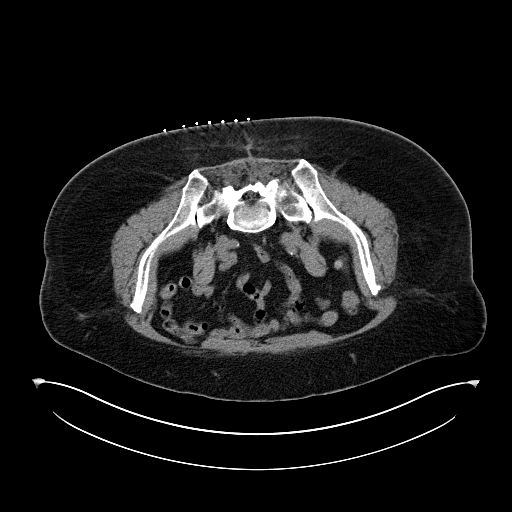
[im 13/23  lung]
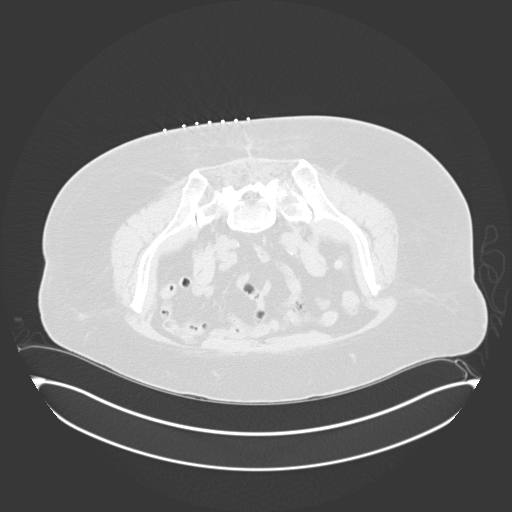
[im 15/23  lung]
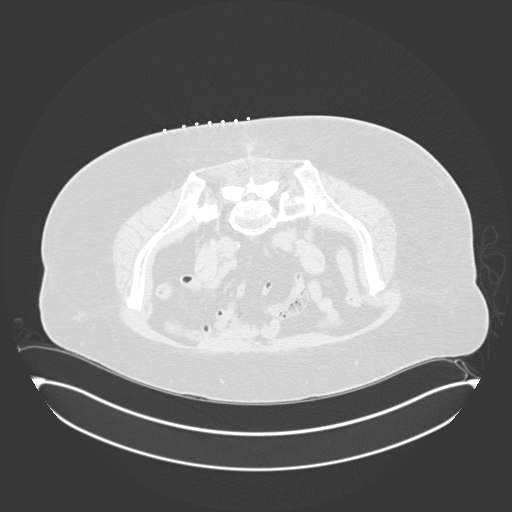
[im 16/23  lung]
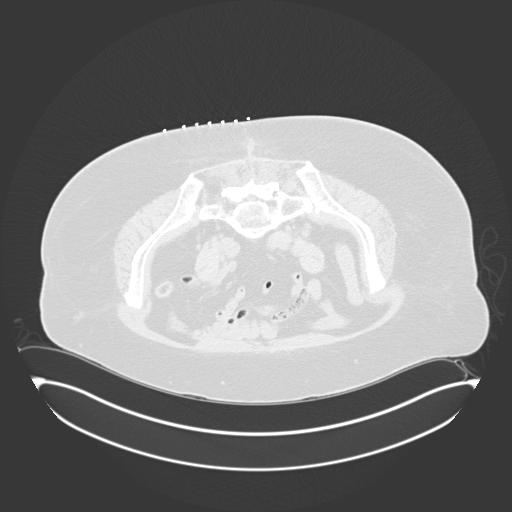
[im 18/23  lung]
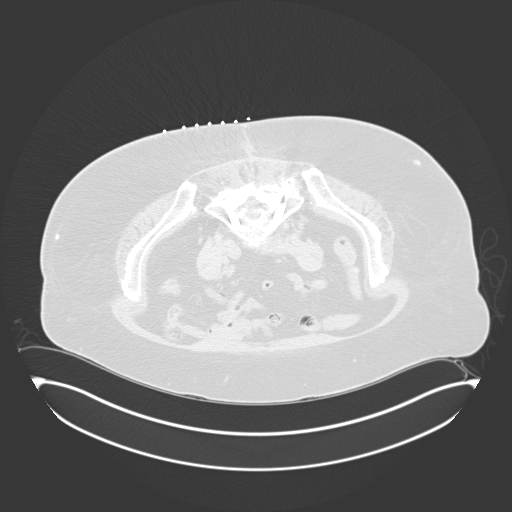
[im 19/23  mediastinal]
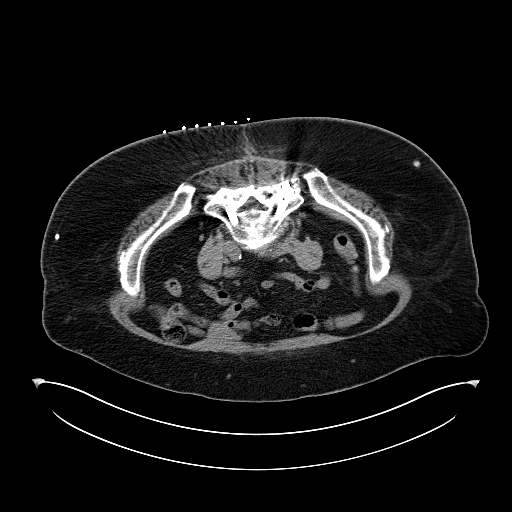
[im 19/23  lung]
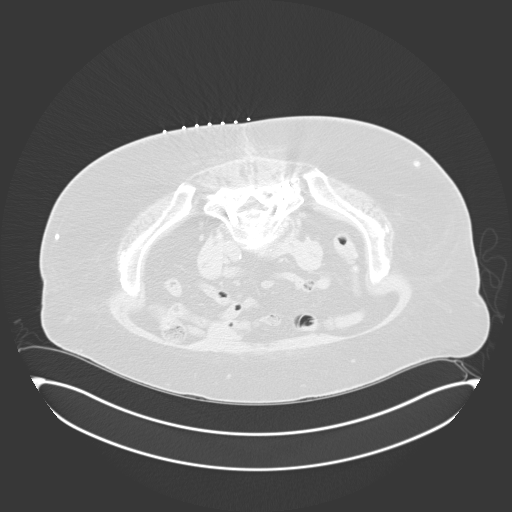
[im 20/23  lung]
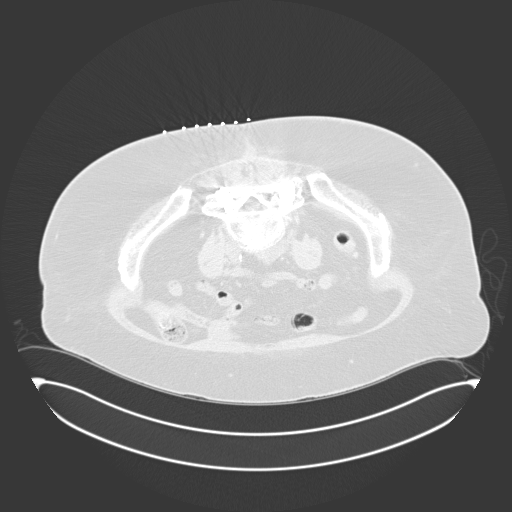
[im 22/23  lung]
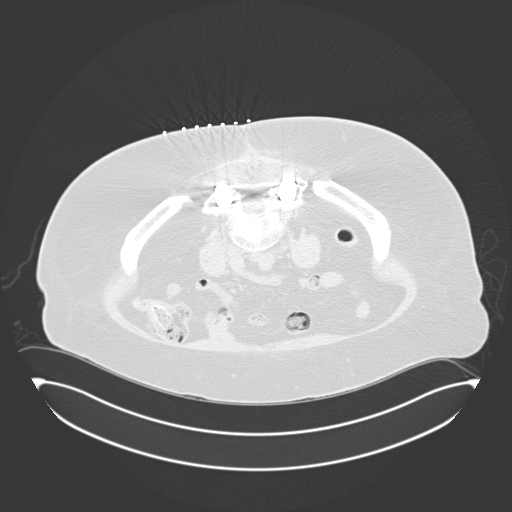

[15 of 26 positions shown; findings below may reference images not displayed]

EXAM:
CT GUIDED BONE MARROW ASPIRATES AND BIOPSY

MEDICATIONS:
None.

ANESTHESIA/SEDATION:
Fentanyl 100 mcg IV; Versed 2.0 mg IV

Moderate Sedation Time:  10 minutes

The patient was continuously monitored during the procedure by the
interventional radiology nurse under my direct supervision.

COMPLICATIONS:
None immediate.

PROCEDURE:
The procedure was explained to the patient. The risks and benefits
of the procedure were discussed and the patient's questions were
addressed. Informed consent was obtained from the patient. The
patient was placed prone on CT table. Images of the pelvis were
obtained. The right side of back was prepped and draped in sterile
fashion. The skin and right posterior ilium were anesthetized with
1% lidocaine. 11 gauge bone needle was directed into the right ilium
with CT guidance. Two aspirates and one core biopsy were obtained.
Bandage placed over the puncture site.
IMPRESSION: CT guided bone marrow aspiration and core biopsy.

## 2022-01-30 IMAGING — US US BIOPSY
1 series · 12 of 12 positions shown · non-contrast
Comparison: None.

INDICATION: Nephrotic range proteinuria. Please perform random renal biopsy for
tissue diagnostic purposes.

EXAM:
ULTRASOUND GUIDED RENAL BIOPSY

[Series 1: us biopsy (kidney) · 12 of 12 slices shown]
[im 1/12]
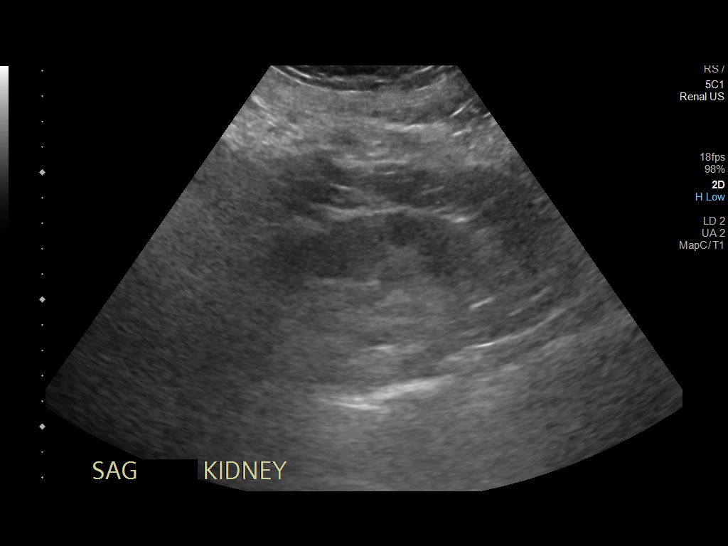
[im 2/12]
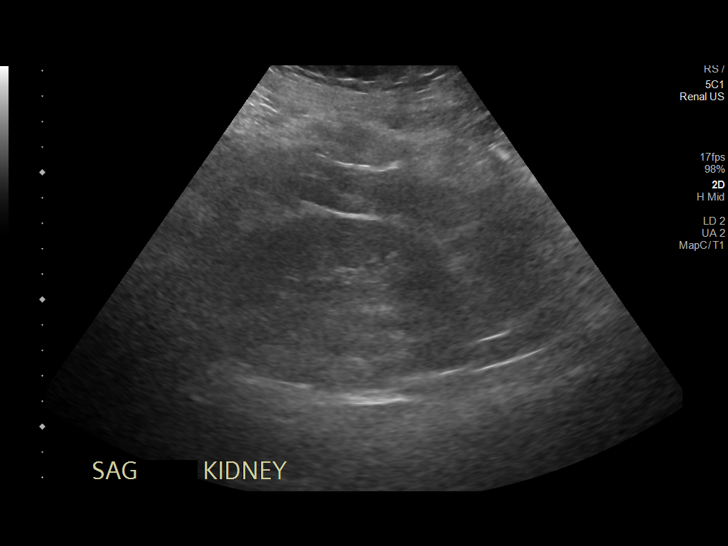
[im 3/12]
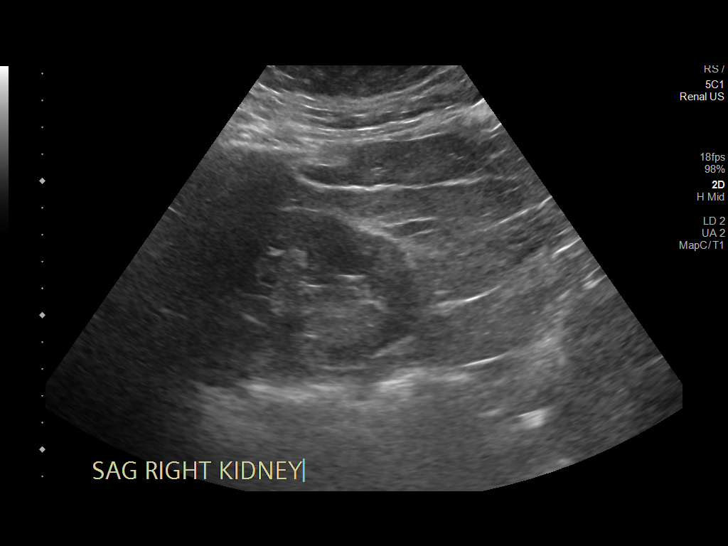
[im 4/12]
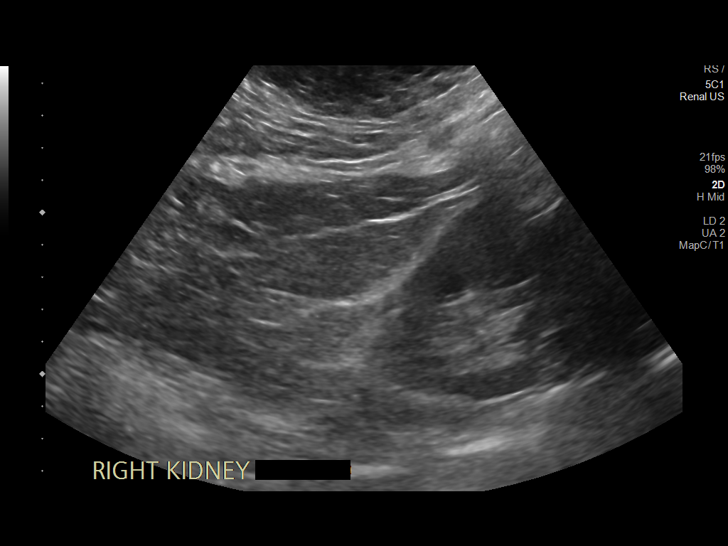
[im 5/12]
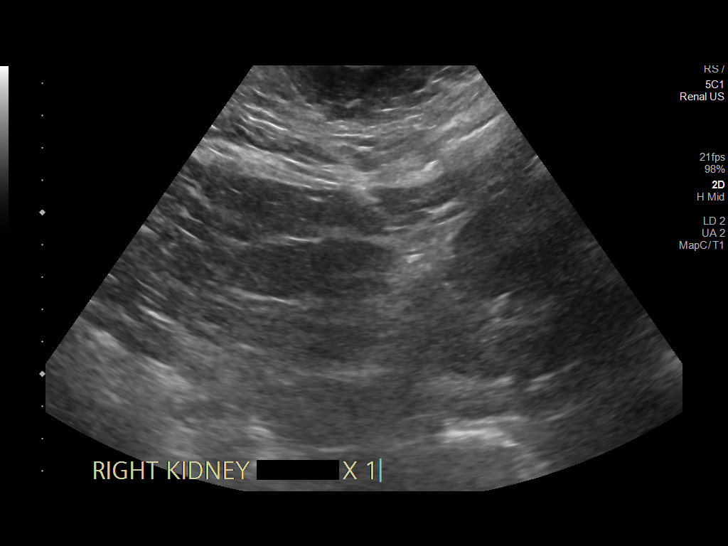
[im 6/12]
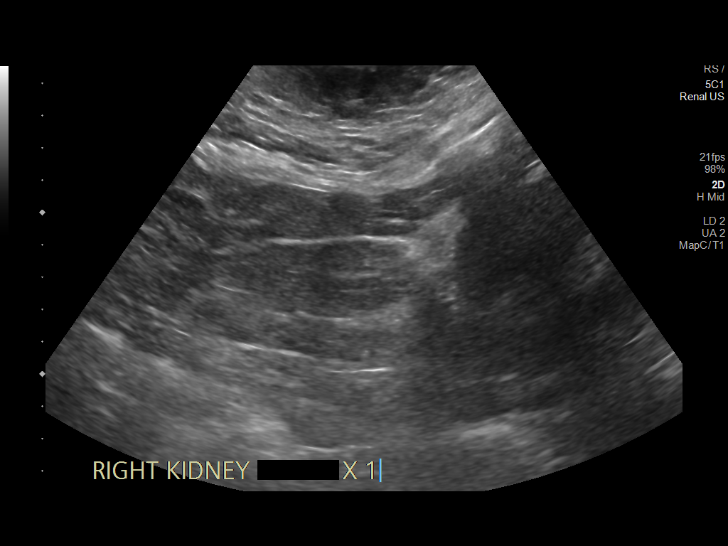
[im 7/12]
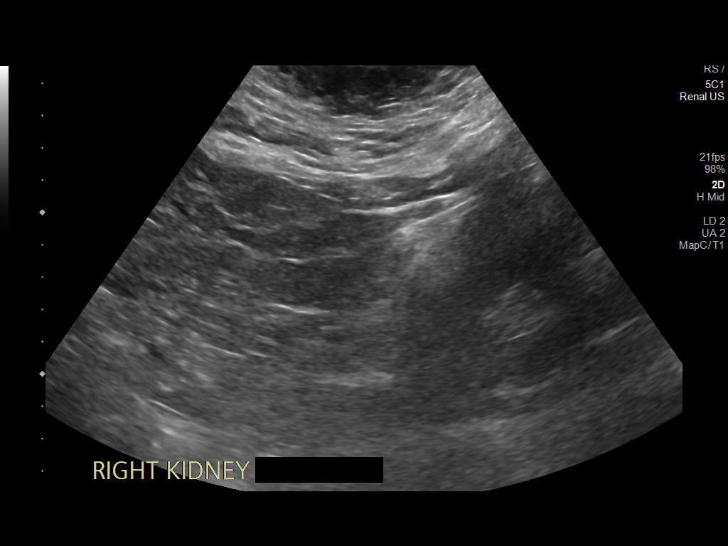
[im 8/12]
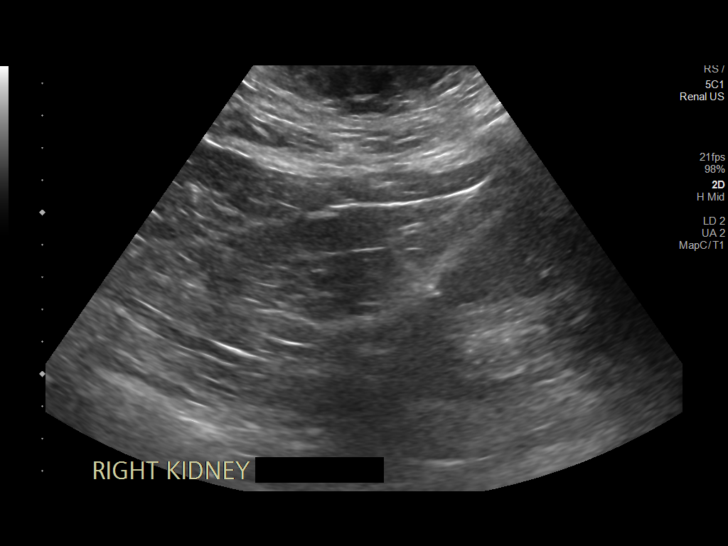
[im 9/12]
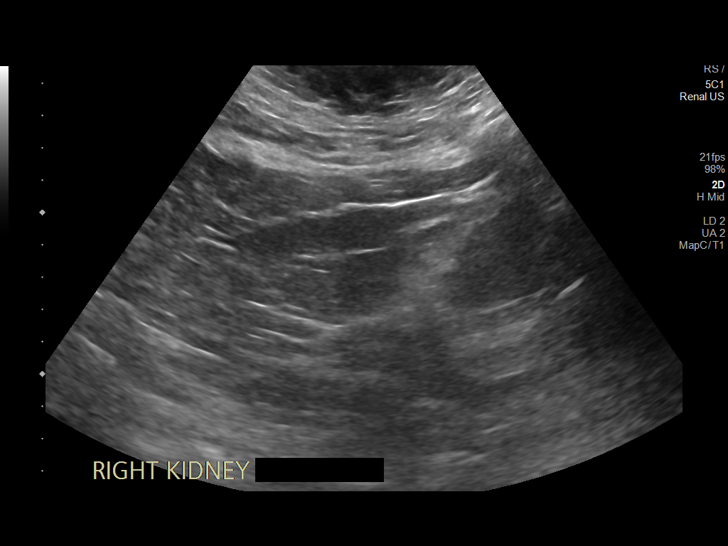
[im 10/12]
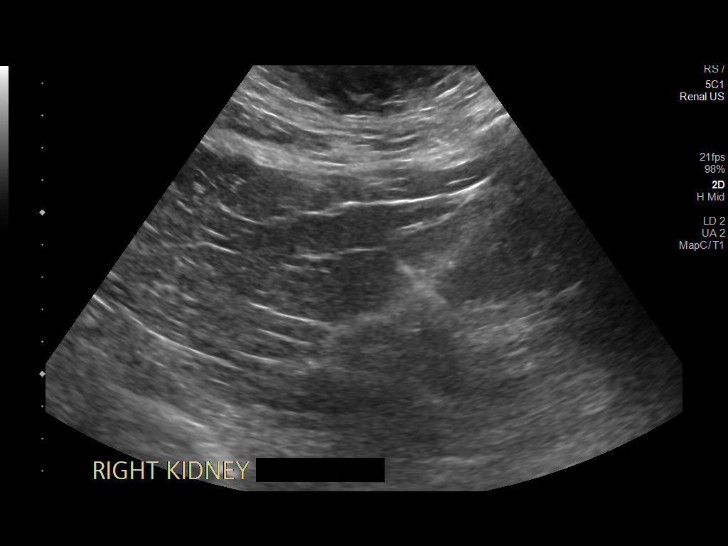
[im 11/12]
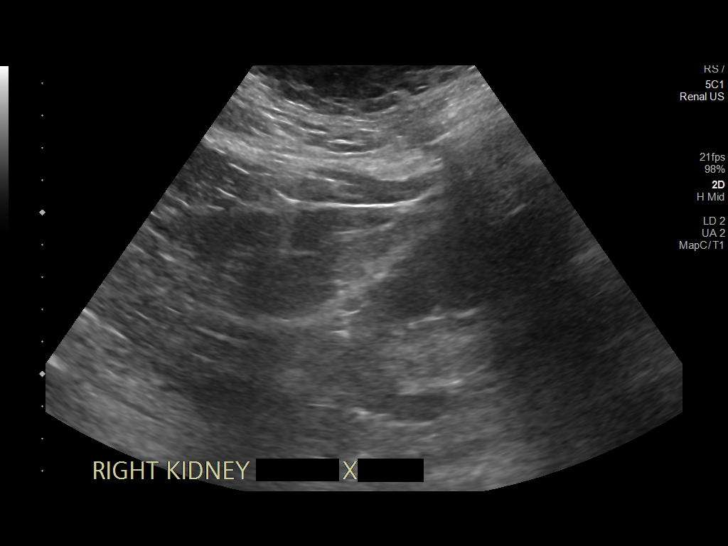
[im 12/12]
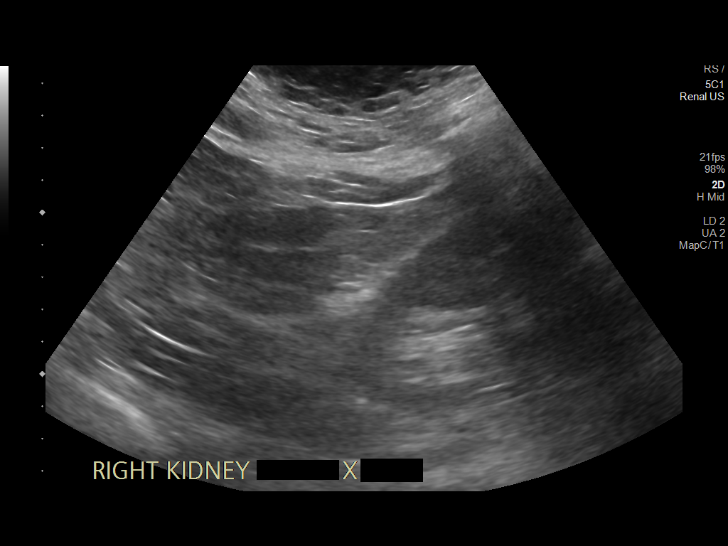

[12 of 12 positions shown; findings below may reference images not displayed]

MEDICATIONS:
None.

ANESTHESIA/SEDATION:
Fentanyl 50 mcg IV; Versed 1 mg IV

Total Moderate Sedation time: 10 minutes; The patient was
continuously monitored during the procedure by the interventional
radiology nurse under my direct supervision.

COMPLICATIONS:
None immediate.

PROCEDURE:
Informed written consent was obtained from the patient after a
discussion of the risks, benefits and alternatives to treatment. The
patient understands and consents the procedure. A timeout was
performed prior to the initiation of the procedure.

Ultrasound scanning was performed of the bilateral flanks. The
inferior pole of the right kidney was selected for biopsy due to
location and sonographic window. The procedure was planned. The
operative site was prepped and draped in the usual sterile fashion.
The overlying soft tissues were anesthetized with 1% lidocaine with
epinephrine. A 17 gauge core needle biopsy device was advanced into
the inferior cortex of the right kidney and 3 core biopsies were
obtained under direct ultrasound guidance. Images were saved for
documentation purposes. The biopsy device was removed and hemostasis
was obtained with manual compression. Post procedural scanning was
negative for significant post procedural hemorrhage or additional
complication. A dressing was placed. The patient tolerated the
procedure well without immediate post procedural complication.
IMPRESSION: Technically successful ultrasound guided right renal biopsy.

## 2022-02-07 NOTE — Patient Instructions (Incomplete)

## 2022-02-08 ENCOUNTER — Ambulatory Visit: Payer: Medicare HMO | Admitting: Nurse Practitioner

## 2022-02-08 DIAGNOSIS — Z789 Other specified health status: Secondary | ICD-10-CM

## 2022-02-08 DIAGNOSIS — E042 Nontoxic multinodular goiter: Secondary | ICD-10-CM

## 2022-02-08 DIAGNOSIS — E890 Postprocedural hypothyroidism: Secondary | ICD-10-CM

## 2022-02-08 DIAGNOSIS — E559 Vitamin D deficiency, unspecified: Secondary | ICD-10-CM

## 2022-02-08 DIAGNOSIS — E1159 Type 2 diabetes mellitus with other circulatory complications: Secondary | ICD-10-CM

## 2022-02-08 DIAGNOSIS — I10 Essential (primary) hypertension: Secondary | ICD-10-CM

## 2022-02-09 IMAGING — CT CT HEAD W/O CM
2 of 4 series · 12 of 47 positions shown, 15 images · non-contrast
Comparison: 08/16/2017

CLINICAL DATA: Headache

EXAM:
CT HEAD WITHOUT CONTRAST
TECHNIQUE: Contiguous axial images were obtained from the base of the skull
through the vertex without intravenous contrast.

[Series 5: coronal soft tissue · coronal · 0.30mm/px · 3 of 71 slices shown]
[im 24/71  brain]
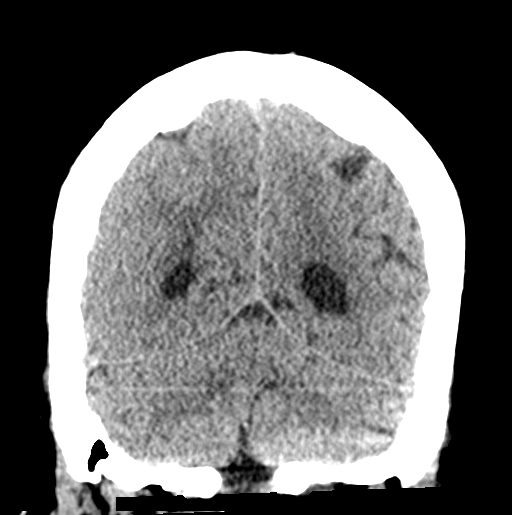
[im 32/71  brain]
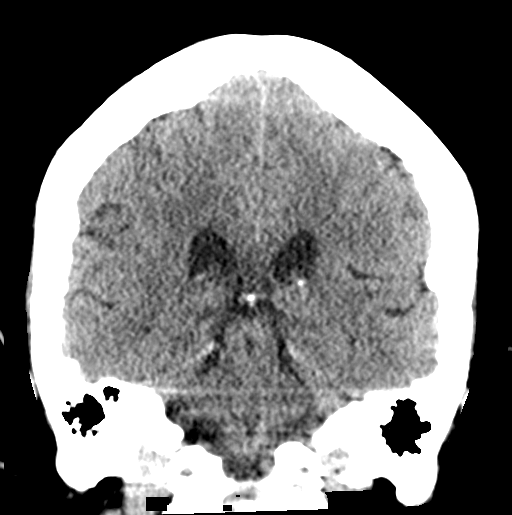
[im 39/71  brain]
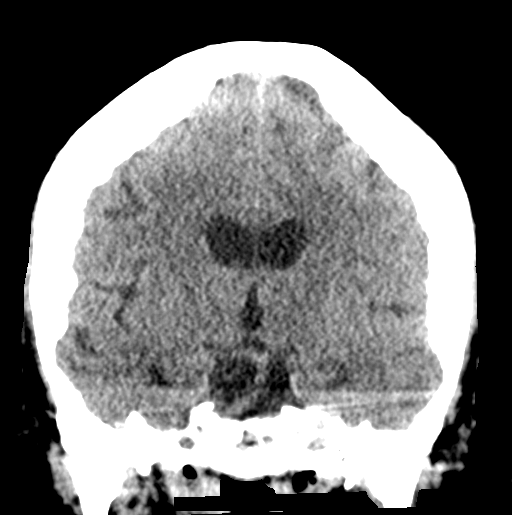

[Series 7: axial soft tissue · axial · 0.30mm/px · z∈[-160,-17]mm · 9 of 55 slices shown, 12 images]
[im 4/55  brain]
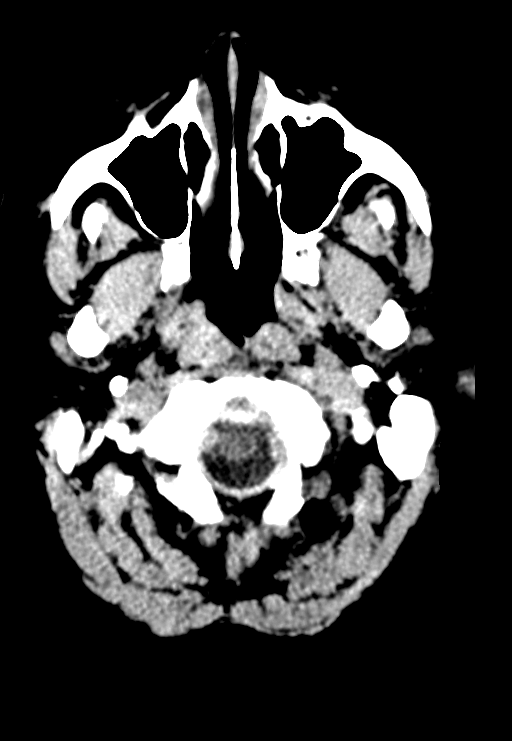
[im 4/55  bone]
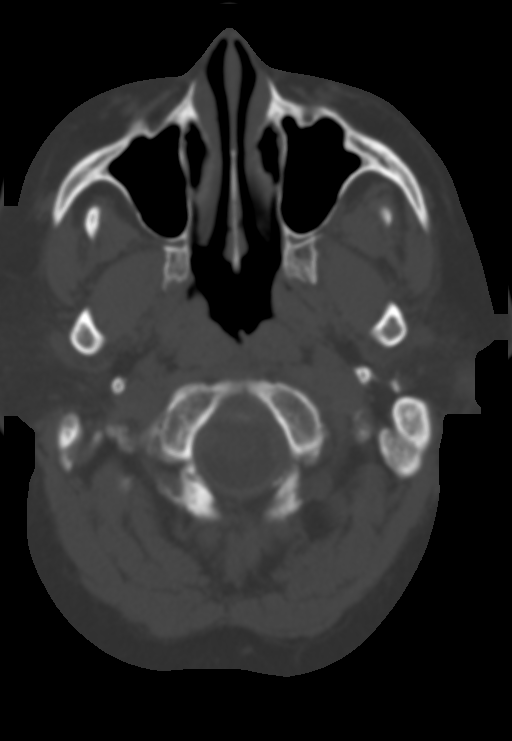
[im 10/55  brain]
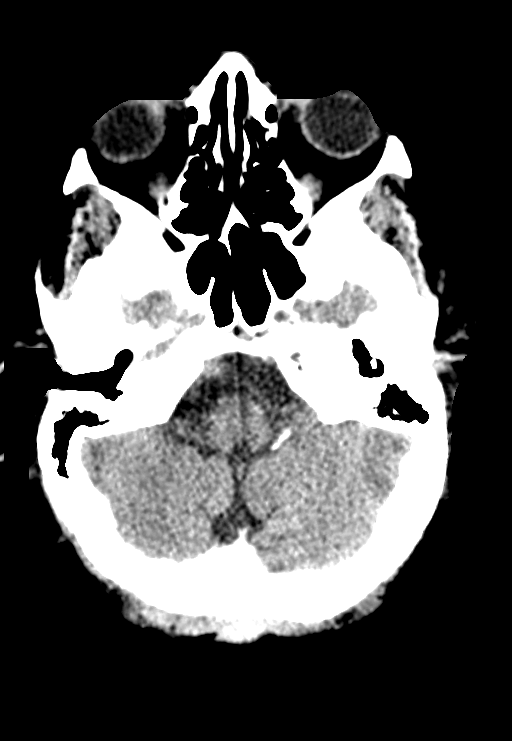
[im 16/55  brain]
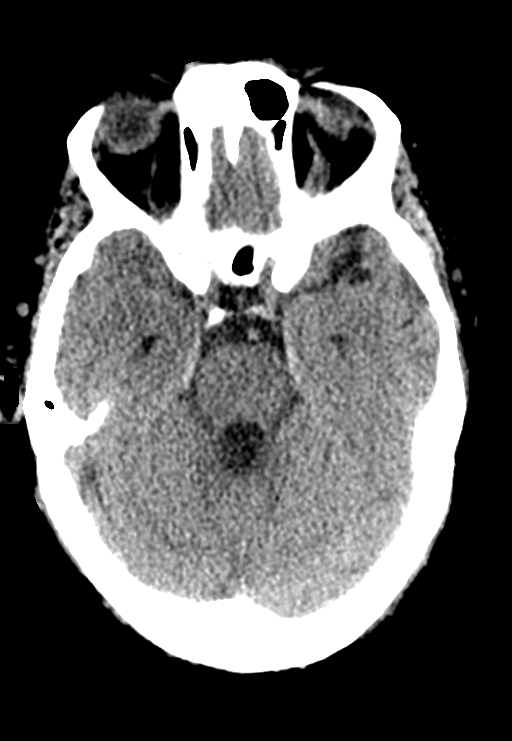
[im 22/55  brain]
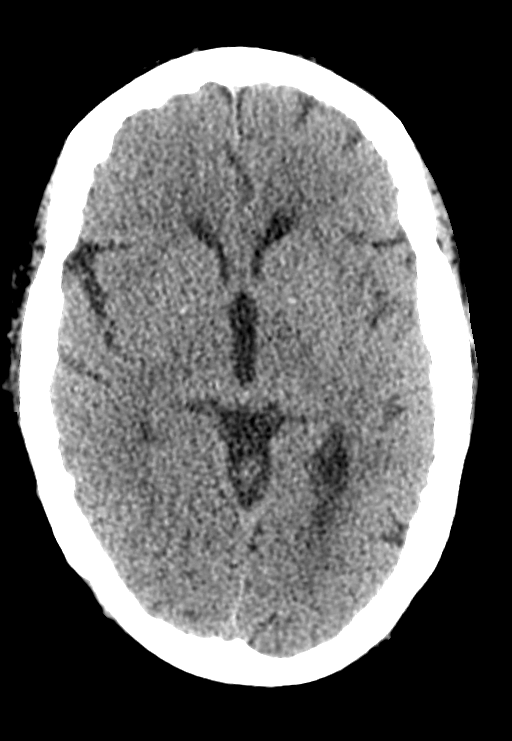
[im 28/55  brain]
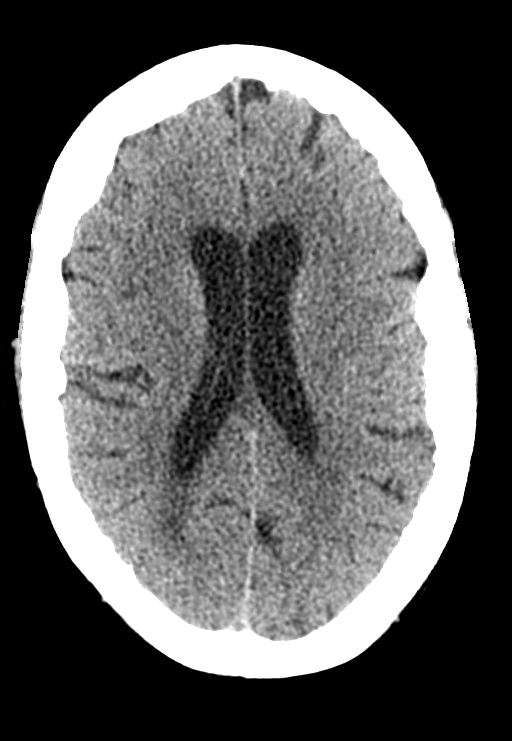
[im 28/55  bone]
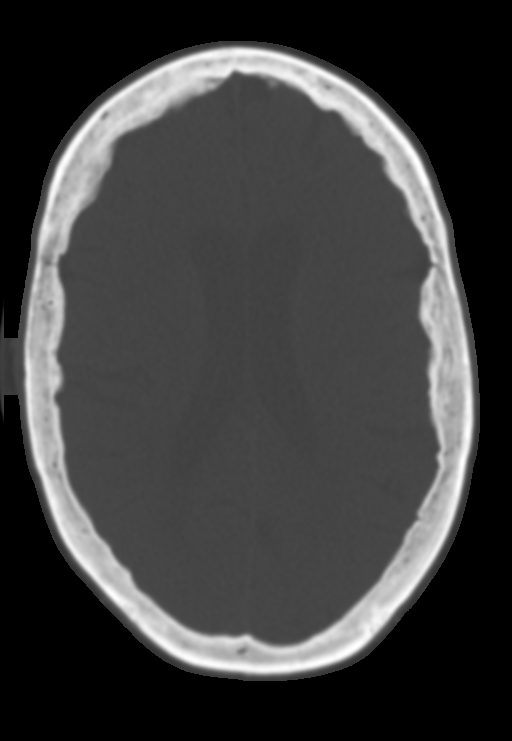
[im 34/55  brain]
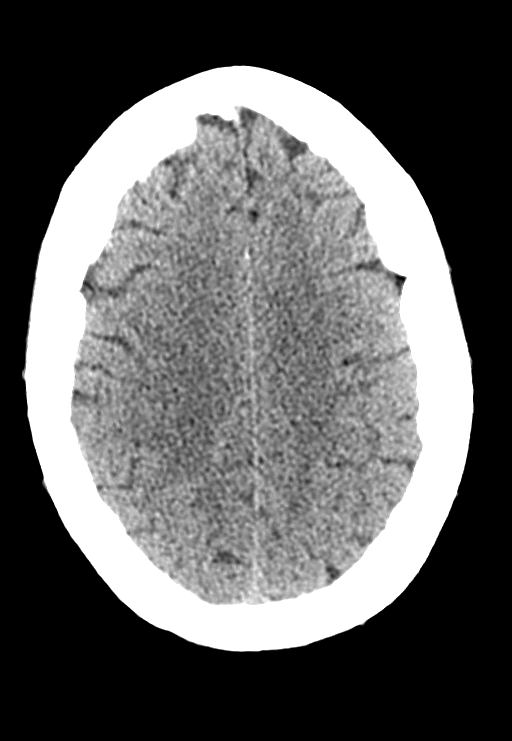
[im 40/55  brain]
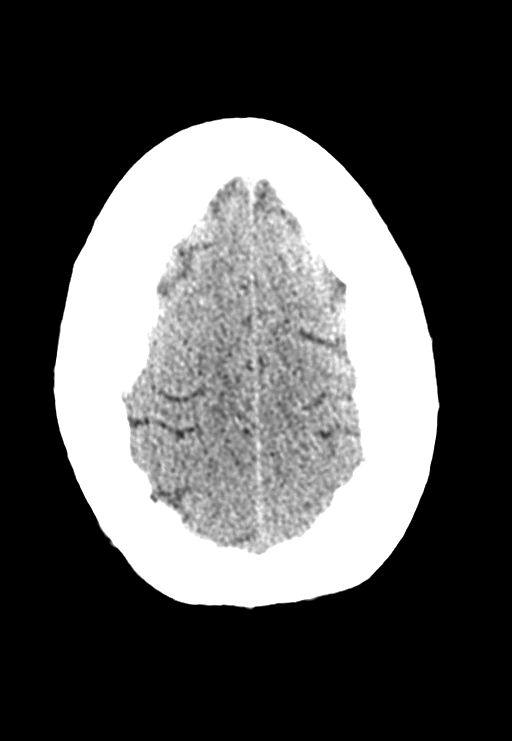
[im 46/55  brain]
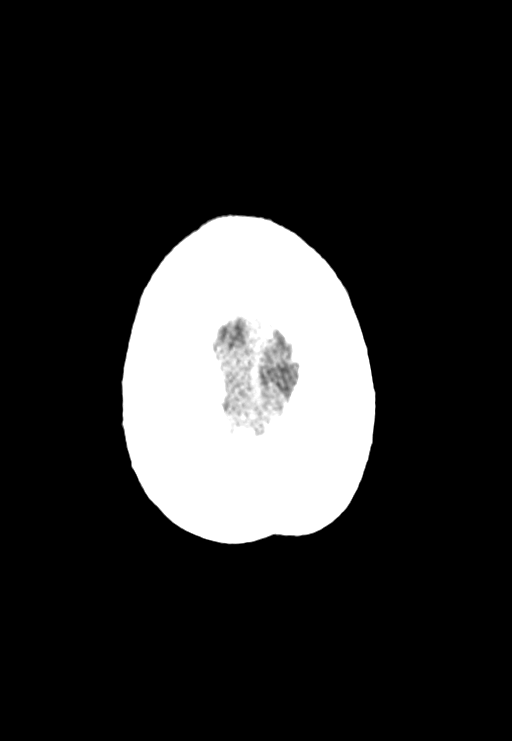
[im 52/55  brain]
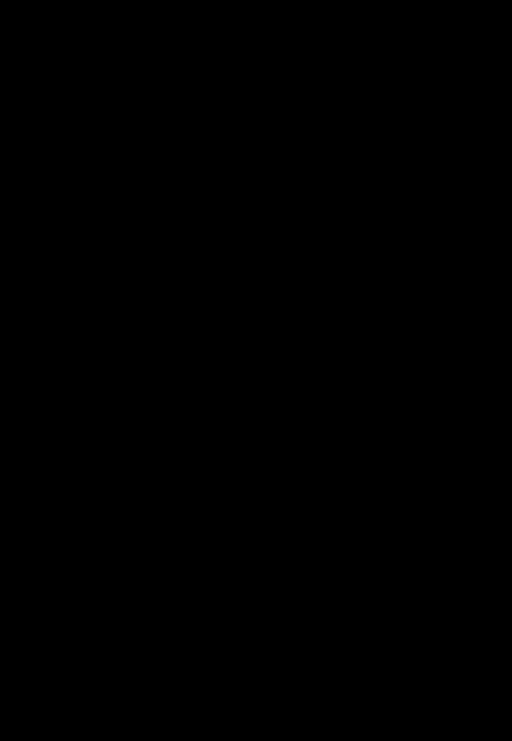
[im 52/55  bone]
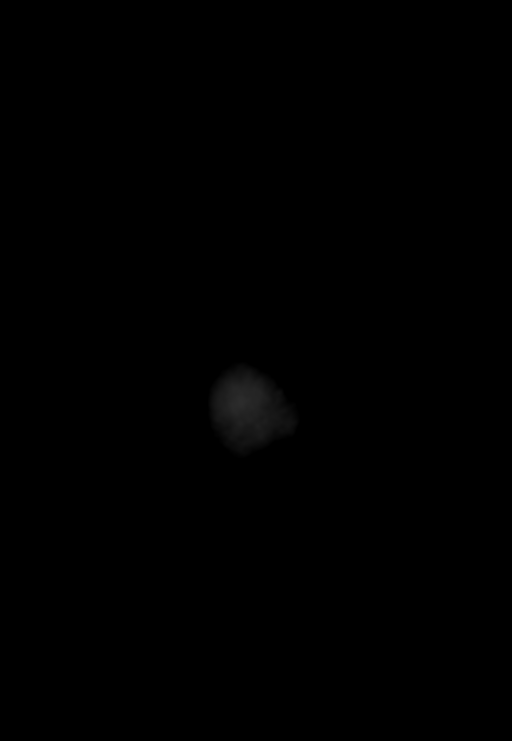

[12 of 47 positions shown; findings below may reference images not displayed]

FINDINGS: Brain: There is no mass, hemorrhage or extra-axial collection. The
size and configuration of the ventricles and extra-axial CSF spaces
are normal. The brain parenchyma is normal, without acute or chronic
infarction.

Vascular: No abnormal hyperdensity of the major intracranial
arteries or dural venous sinuses. No intracranial atherosclerosis.

Skull: The visualized skull base, calvarium and extracranial soft
tissues are normal.

Sinuses/Orbits: No fluid levels or advanced mucosal thickening of
the visualized paranasal sinuses. No mastoid or middle ear effusion.
The orbits are normal.
IMPRESSION: Normal head CT.

## 2022-02-10 DIAGNOSIS — M4716 Other spondylosis with myelopathy, lumbar region: Secondary | ICD-10-CM | POA: Diagnosis not present

## 2022-02-15 IMAGING — CT CT ABD-PELV W/O CM
2 of 4 series · 16 of 46 positions shown, 18 images · non-contrast
Comparison: 12/25/2017

CLINICAL DATA: Right-sided abdominal pain for 1 week

EXAM:
CT ABDOMEN AND PELVIS WITHOUT CONTRAST
TECHNIQUE: Multidetector CT imaging of the abdomen and pelvis was performed
following the standard protocol without IV contrast. Oral enteric
contrast was administered.

[Series 2: axial st · axial · 0.82mm/px · z∈[-449,-24]mm · 13 of 97 slices shown, 15 images]
[im 6/97  soft-tissue]
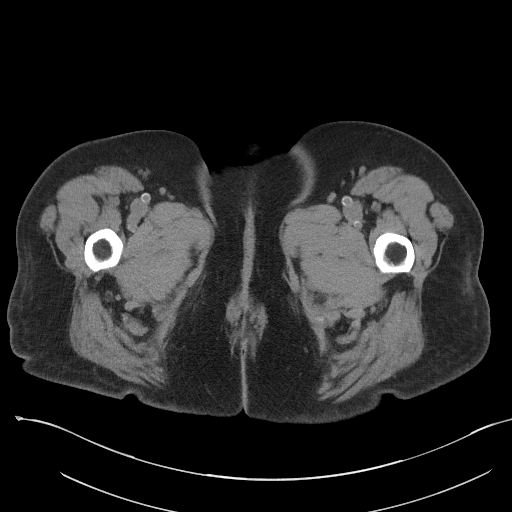
[im 6/97  bone]
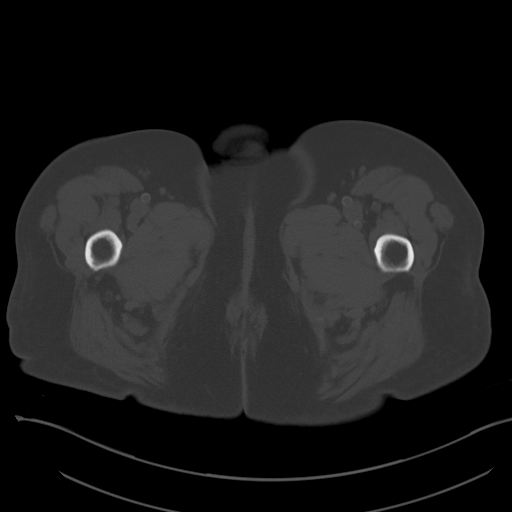
[im 12/97  soft-tissue]
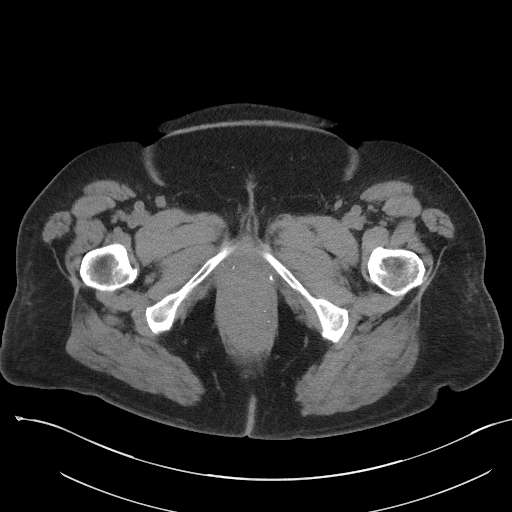
[im 23/97  soft-tissue]
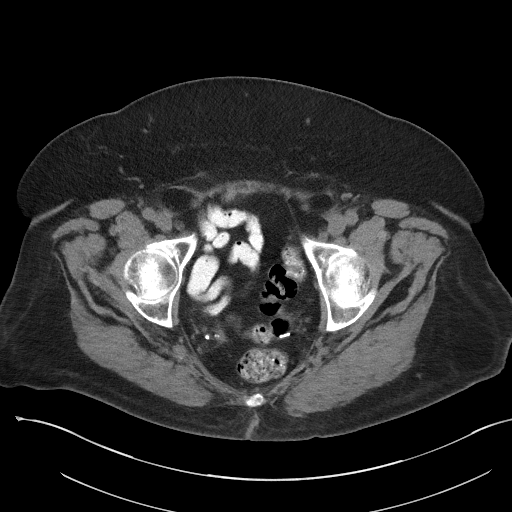
[im 29/97  soft-tissue]
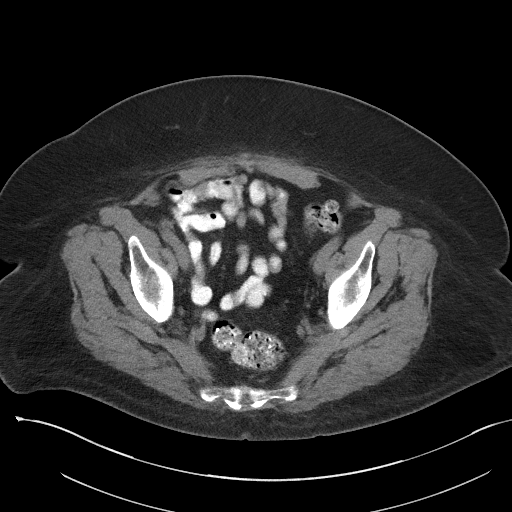
[im 34/97  soft-tissue]
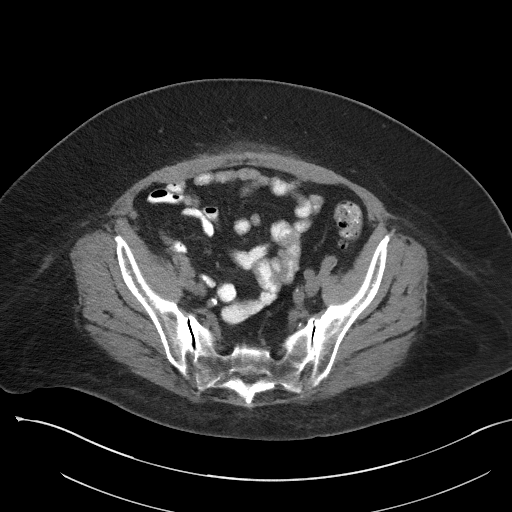
[im 40/97  soft-tissue]
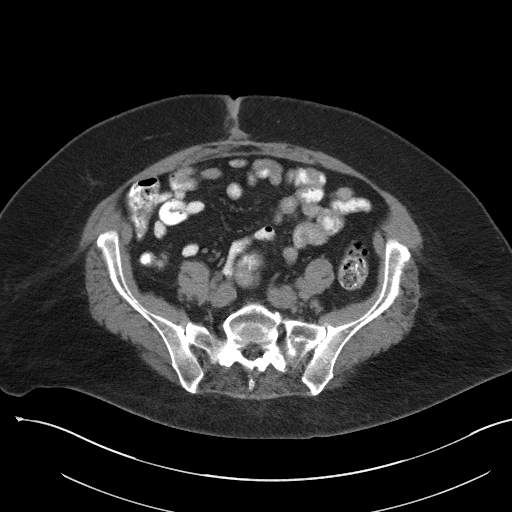
[im 51/97  soft-tissue]
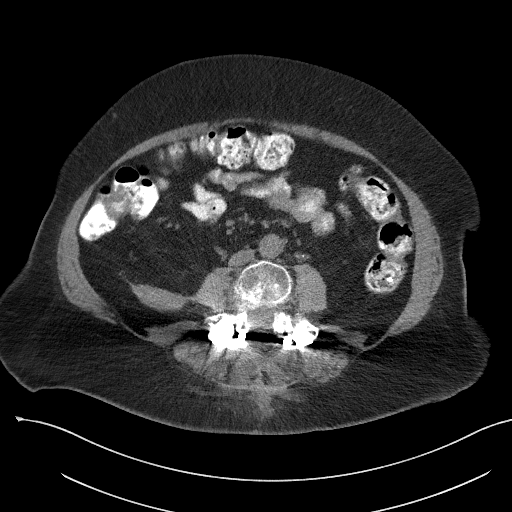
[im 57/97  soft-tissue]
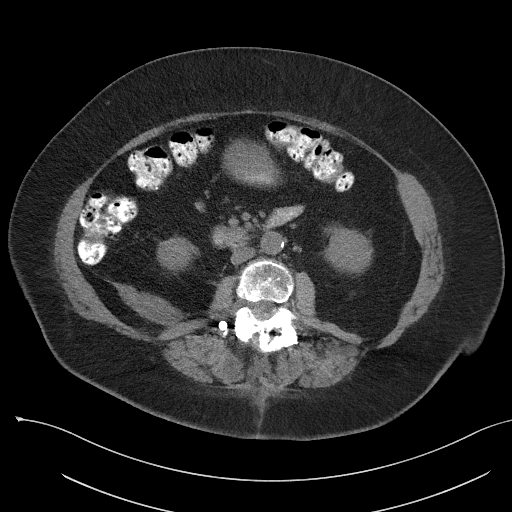
[im 63/97  soft-tissue]
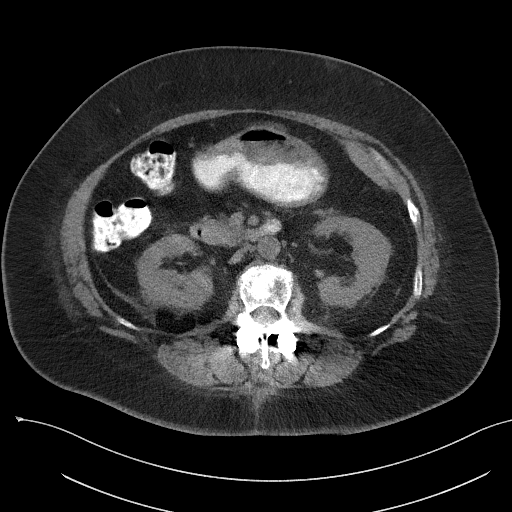
[im 63/97  bone]
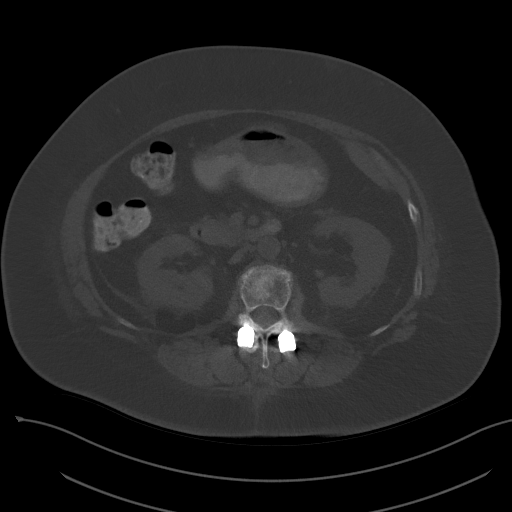
[im 68/97  soft-tissue]
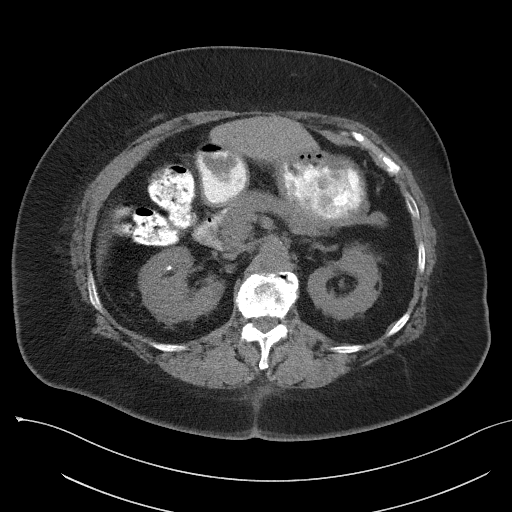
[im 74/97  soft-tissue]
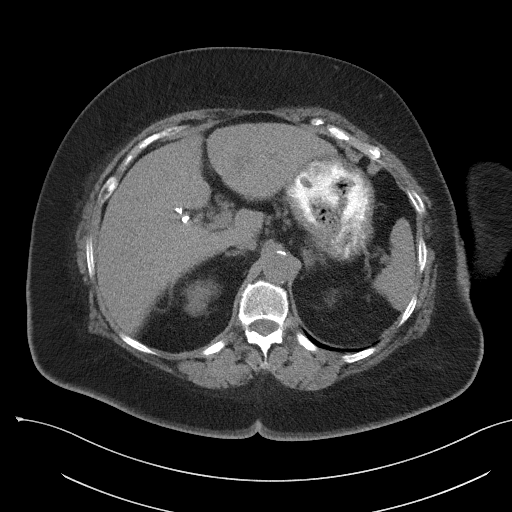
[im 85/97  soft-tissue]
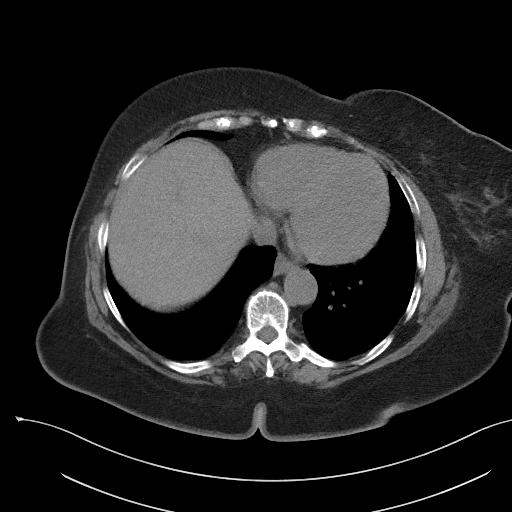
[im 91/97  soft-tissue]
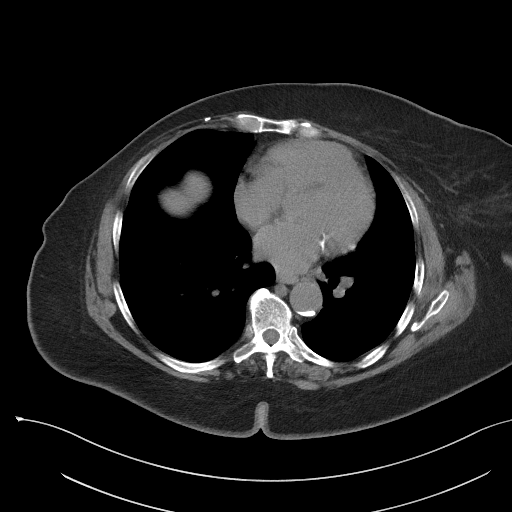

[Series 5: coronal st · coronal · 0.87mm/px · 3 of 122 slices shown]
[im 41/122  soft-tissue]
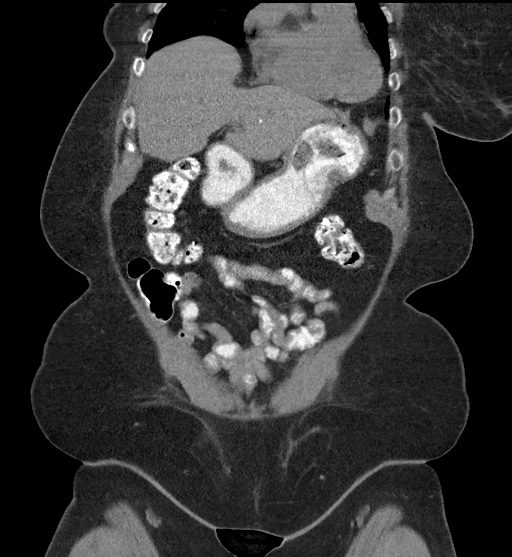
[im 54/122  soft-tissue]
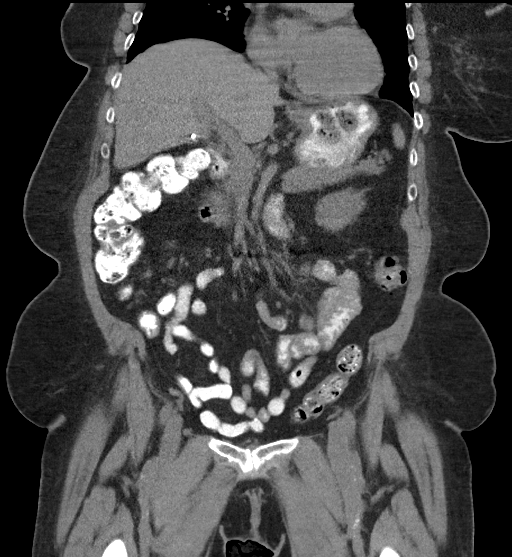
[im 68/122  soft-tissue]
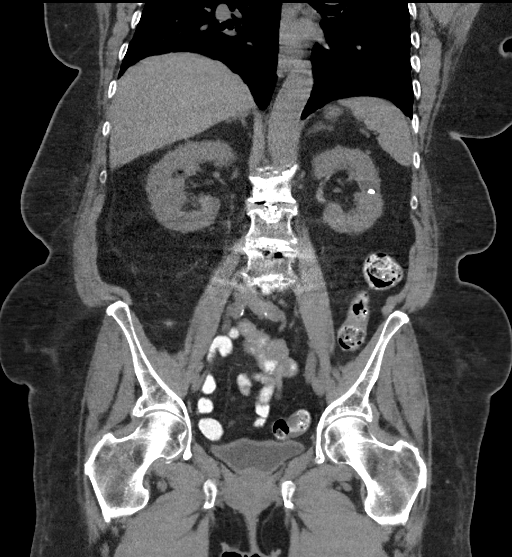

[16 of 46 positions shown; findings below may reference images not displayed]

FINDINGS: Lower chest: Scattered ground-glass opacities in the left lung base
(series 3, image 32).

Hepatobiliary: No focal liver abnormality is seen. Status post
cholecystectomy. No biliary dilatation.

Pancreas: Unremarkable. No pancreatic ductal dilatation or
surrounding inflammatory changes.

Spleen: Normal in size without significant abnormality.

Adrenals/Urinary Tract: Adrenal glands are unremarkable. There are
small bilateral nonobstructive renal calculi. Bladder is
unremarkable.

Stomach/Bowel: Stomach is within normal limits. Appendix appears
normal. No evidence of bowel wall thickening, distention, or
inflammatory changes.

Vascular/Lymphatic: Aortic atherosclerosis. No enlarged abdominal or
pelvic lymph nodes.

Reproductive: Status post hysterectomy

Other: Fat containing bilateral inguinal hernias. No abdominopelvic
ascites. There is an intermediate attenuation collection in the
right retrorenal space measuring approximately 7.0 x 6.3 x 2.4 cm
(series 2, image 43, series 6, image 54).

Musculoskeletal: No acute or significant osseous findings.
IMPRESSION: 1. There is an intermediate attenuation collection in the right
retrorenal space measuring approximately 7.0 x 6.3 x 2.4 cm,
consistent with retroperitoneal hemorrhage.
2. Nonobstructive bilateral nephrolithiasis.  No hydronephrosis.
3. Scattered ground-glass opacities in the left lung base,
consistent with nonspecific infection or inflammation.
4. Status post cholecystectomy and hysterectomy.
5. Aortic Atherosclerosis ([id number]).

These results will be called to the ordering clinician or
representative by the Radiologist Assistant, and communication
documented in the PACS or [REDACTED].

## 2022-02-16 DIAGNOSIS — N184 Chronic kidney disease, stage 4 (severe): Secondary | ICD-10-CM | POA: Diagnosis not present

## 2022-02-17 ENCOUNTER — Ambulatory Visit (INDEPENDENT_AMBULATORY_CARE_PROVIDER_SITE_OTHER): Payer: Medicare HMO | Admitting: Family Medicine

## 2022-02-17 ENCOUNTER — Encounter: Payer: Self-pay | Admitting: Family Medicine

## 2022-02-17 VITALS — BP 118/68 | HR 55 | Temp 98.8°F | Resp 17 | Ht 64.0 in | Wt 190.5 lb

## 2022-02-17 DIAGNOSIS — N1832 Chronic kidney disease, stage 3b: Secondary | ICD-10-CM | POA: Diagnosis not present

## 2022-02-17 DIAGNOSIS — G8929 Other chronic pain: Secondary | ICD-10-CM

## 2022-02-17 DIAGNOSIS — M545 Low back pain, unspecified: Secondary | ICD-10-CM | POA: Diagnosis not present

## 2022-02-17 DIAGNOSIS — R803 Bence Jones proteinuria: Secondary | ICD-10-CM | POA: Diagnosis not present

## 2022-02-17 DIAGNOSIS — R809 Proteinuria, unspecified: Secondary | ICD-10-CM | POA: Diagnosis not present

## 2022-02-17 DIAGNOSIS — R768 Other specified abnormal immunological findings in serum: Secondary | ICD-10-CM | POA: Diagnosis not present

## 2022-02-17 DIAGNOSIS — I272 Pulmonary hypertension, unspecified: Secondary | ICD-10-CM | POA: Diagnosis not present

## 2022-02-17 DIAGNOSIS — I10 Essential (primary) hypertension: Secondary | ICD-10-CM | POA: Diagnosis not present

## 2022-02-17 DIAGNOSIS — I130 Hypertensive heart and chronic kidney disease with heart failure and stage 1 through stage 4 chronic kidney disease, or unspecified chronic kidney disease: Secondary | ICD-10-CM | POA: Diagnosis not present

## 2022-02-17 DIAGNOSIS — Z1331 Encounter for screening for depression: Secondary | ICD-10-CM

## 2022-02-17 DIAGNOSIS — E1122 Type 2 diabetes mellitus with diabetic chronic kidney disease: Secondary | ICD-10-CM | POA: Diagnosis not present

## 2022-02-17 DIAGNOSIS — I5032 Chronic diastolic (congestive) heart failure: Secondary | ICD-10-CM | POA: Diagnosis not present

## 2022-02-17 MED ORDER — HYDRALAZINE HCL 50 MG PO TABS: 50.0000 mg | ORAL_TABLET | Freq: Two times a day (BID) | ORAL | 1 refills | Status: DC

## 2022-02-17 MED ORDER — CARVEDILOL 6.25 MG PO TABS: 3.1250 mg | ORAL_TABLET | Freq: Two times a day (BID) | ORAL | 1 refills | Status: DC

## 2022-02-17 MED ORDER — DULOXETINE HCL 20 MG PO CPEP: 20.0000 mg | ORAL_CAPSULE | Freq: Every day | ORAL | 3 refills | Status: DC

## 2022-02-17 NOTE — Patient Instructions (Addendum)
Follow up in 4-6 weeks to recheck mood and pain START the Duloxetine once daily for both mood and pain Continue the Hydralazine twice daily and the Carvedilol 1/2 tab twice daily No other med changes at this time Call with any questions or concerns Stay Safe!  Stay Healthy! Hang in there!!

## 2022-02-17 NOTE — Progress Notes (Signed)
   Subjective:    Patient ID: [name], female    DOB: April 02, 1951, 71 y.o.   MRN: [id number]  HPI HTN- chronic problem, at last visit we decreased her Coreg to 1/2 tab BID and decreased Hydralazine to BID rather than TID.  Also on Losartan '25mg'$  daily, Torsemide '20mg'$  BID, and Amlodipine '10mg'$  daily.  Pt reports BP is not dropping as low.  Pt reports dizziness is less frequent but still occurs.  No CP, SOB, Has.  Pt thinks swelling in legs and feet has improved.    + depression screen- pt is frustrated due to ongoing back issues.  'i don't do anything'.  Pt will be starting PT for her back.  'my whole body hurts'.   Review of Systems For ROS see HPI     Objective:   Physical Exam        Assessment & Plan:

## 2022-02-20 IMAGING — US US RENAL
2 of 3 series · 14 of 25 positions shown · non-contrast
Comparison: CT abdomen pelvis, 10/25/2019, renal ultrasound,
09/13/2019, CT abdomen pelvis, 12/25/2017

CLINICAL DATA: Retroperitoneal hemorrhage

EXAM:
RENAL / URINARY TRACT ULTRASOUND COMPLETE

[Series 1: us renal · 0.26mm/px · 13 of 73 slices shown (1 of 2)]
[im 1/73]
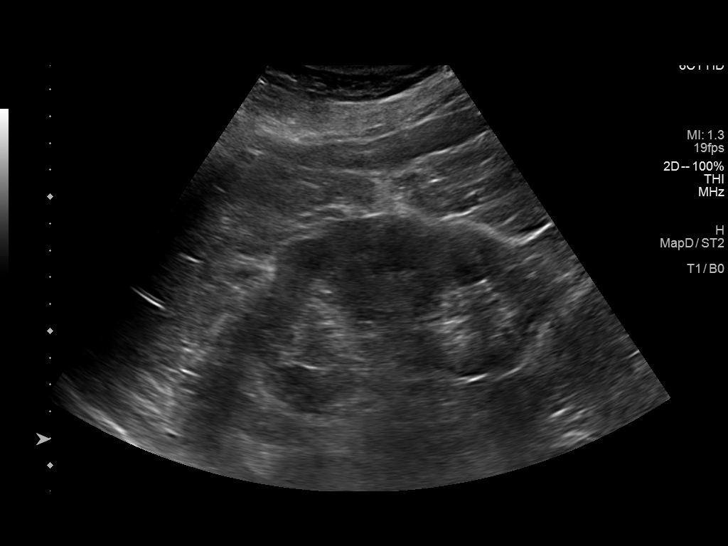
[im 7/73]
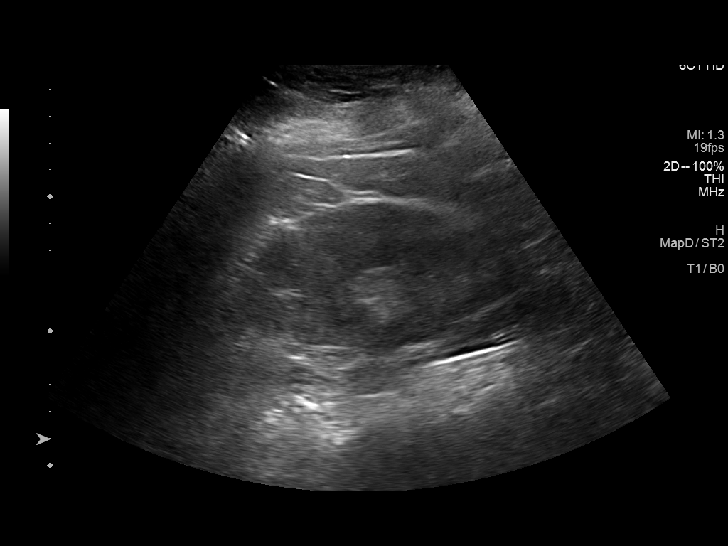
[im 14/73]
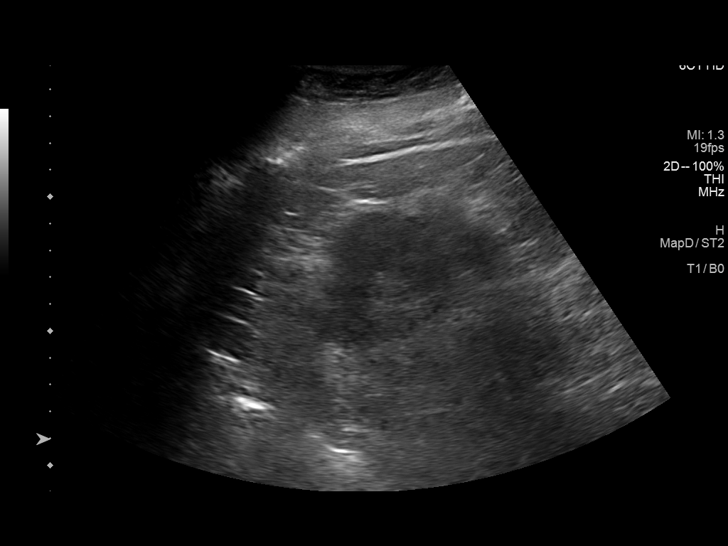
[im 20/73]
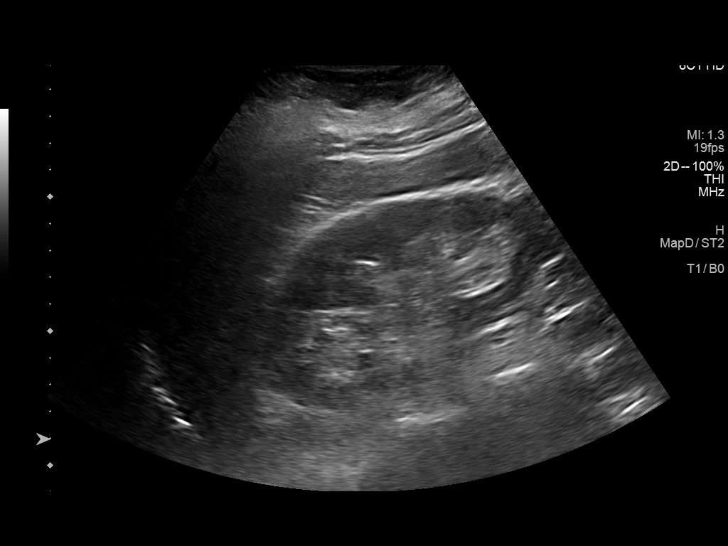
[im 27/73]
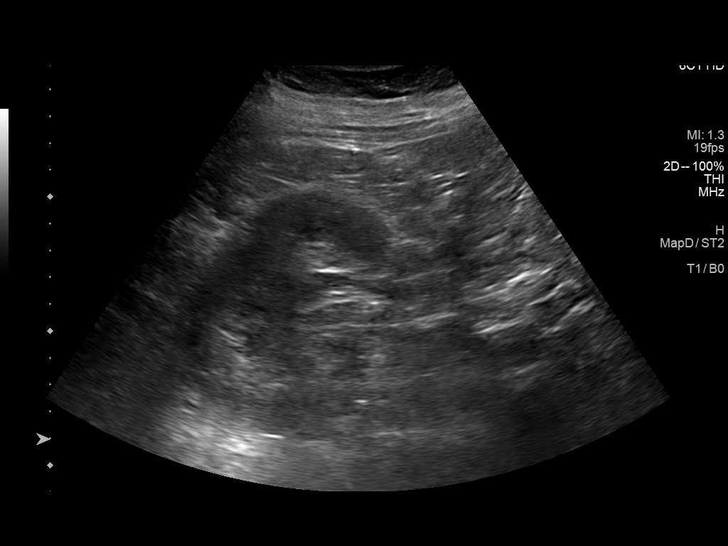
[im 30/73]
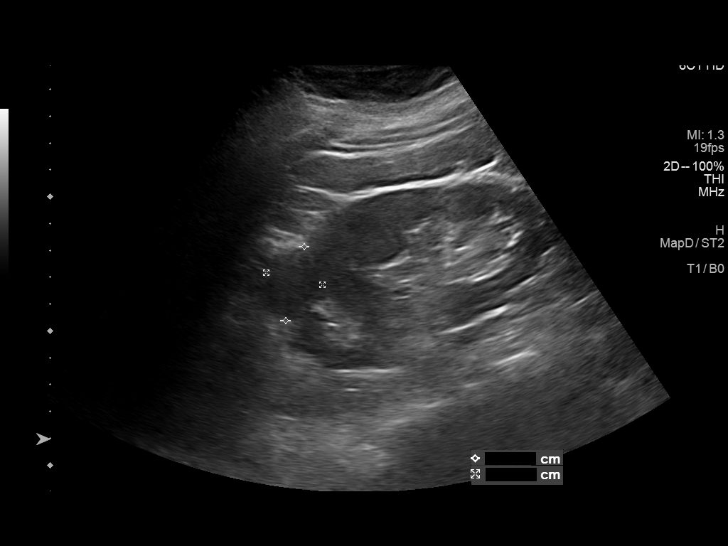
[im 37/73]
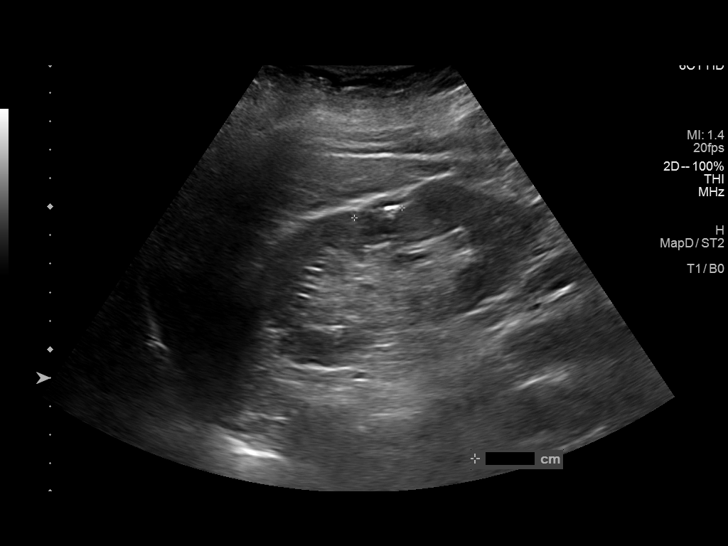
[im 43/73]
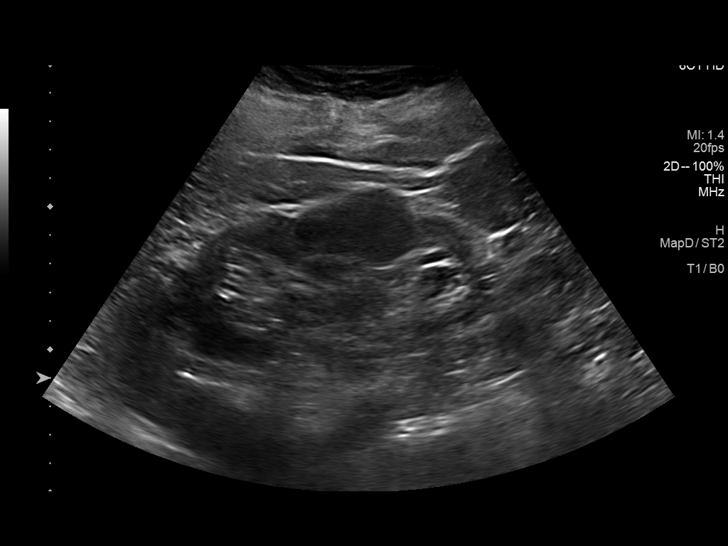
[im 50/73]
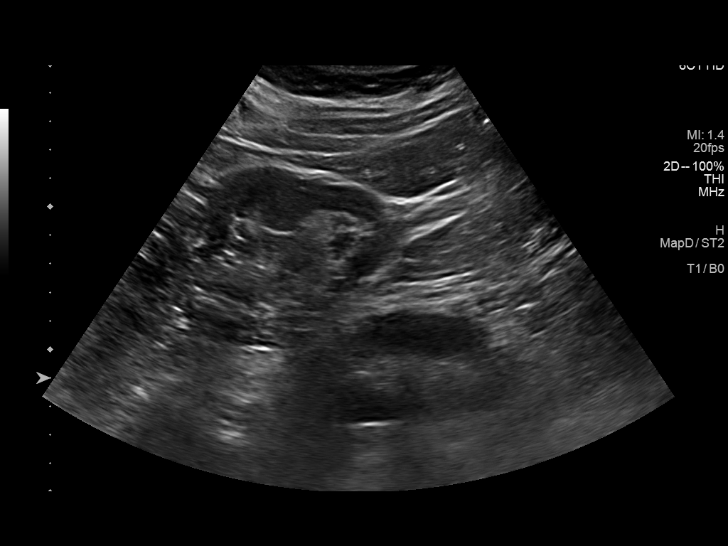
[im 53/73]
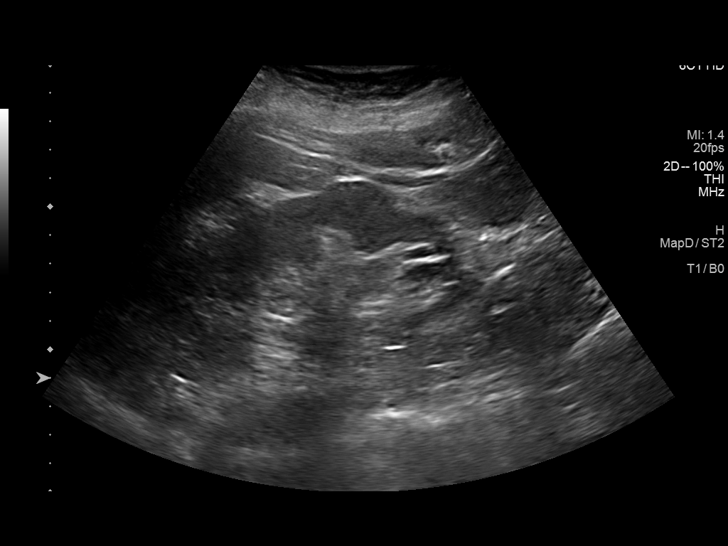
[im 59/73]
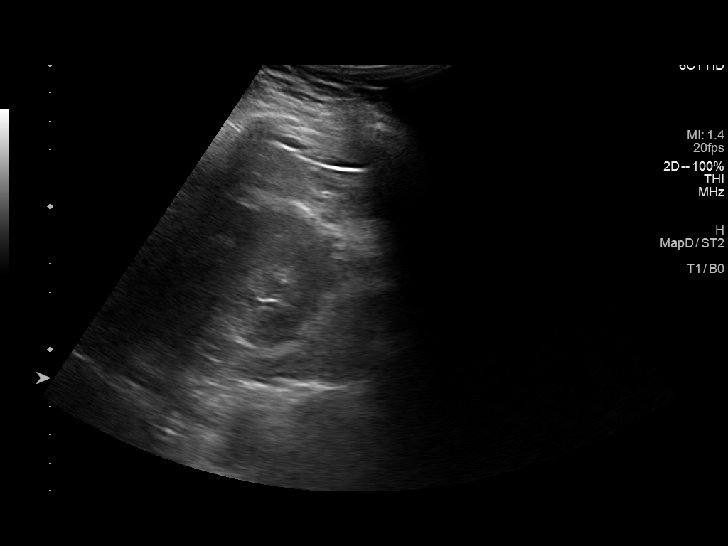
[im 66/73]
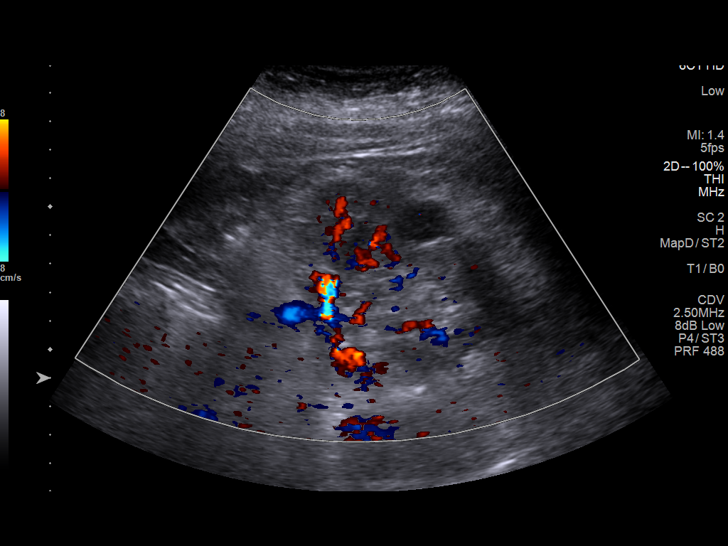
[im 73/73]
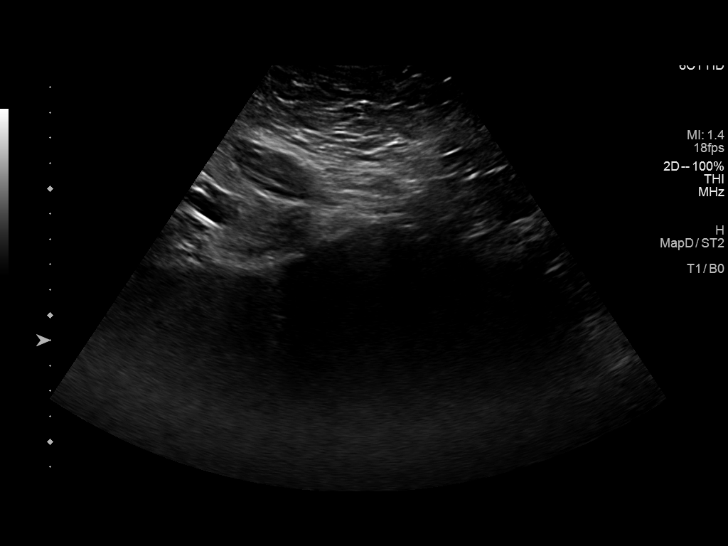

[Series 2003: us renal · 0.25mm/px · 1 of 2 slices shown (2 of 2)]
[im 1/2]
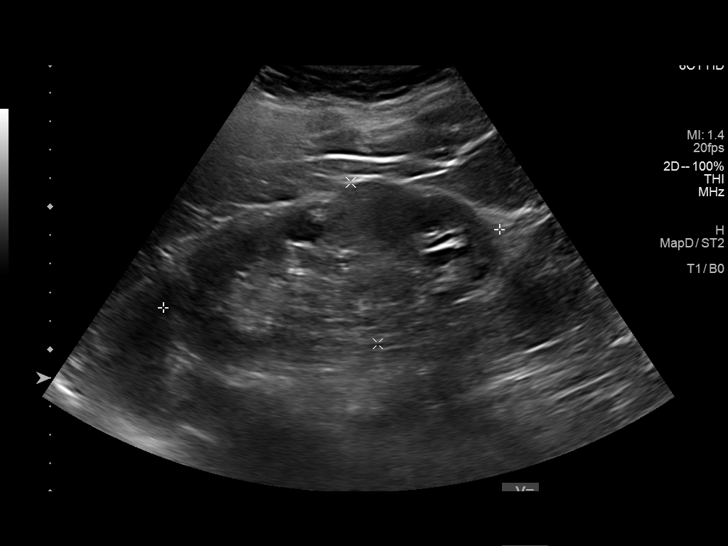

[14 of 25 positions shown; findings below may reference images not displayed]

FINDINGS: Right Kidney:

Renal measurements: 11.5 x 6.9 x 8.4 cm = volume: 347 mL .
Echogenicity within normal limits. Lobulated renal contours. No mass
or hydronephrosis visualized.

Left Kidney:

Renal measurements: 12.1 x 5.7 x 7.2 cm = volume: 216 mL.
Echogenicity within normal limits. Lobulated renal contours.
Shadowing calculus of the midportion of the left kidney measuring
1.2 cm. No mass or hydronephrosis visualized.

Bladder:

Appears normal for degree of bladder distention.

Other:

Retroperitoneal hematoma is not appreciated by ultrasound.
IMPRESSION: 1.  Nonobstructive left renal calculus.  No hydronephrosis.

2. Right-sided retroperitoneal hematoma identified by prior CT is
not appreciated by ultrasound.

## 2022-02-25 IMAGING — DX DG SHOULDER 2+V*R*
2 series · 2 of 2 positions shown · non-contrast
Comparison: Right shoulder radiograph dated 01/24/2017.

CLINICAL DATA: 69-year-old female with right shoulder pain. No
known injury.

EXAM:
RIGHT SHOULDER - 2+ VIEW

[shoulder grashey]
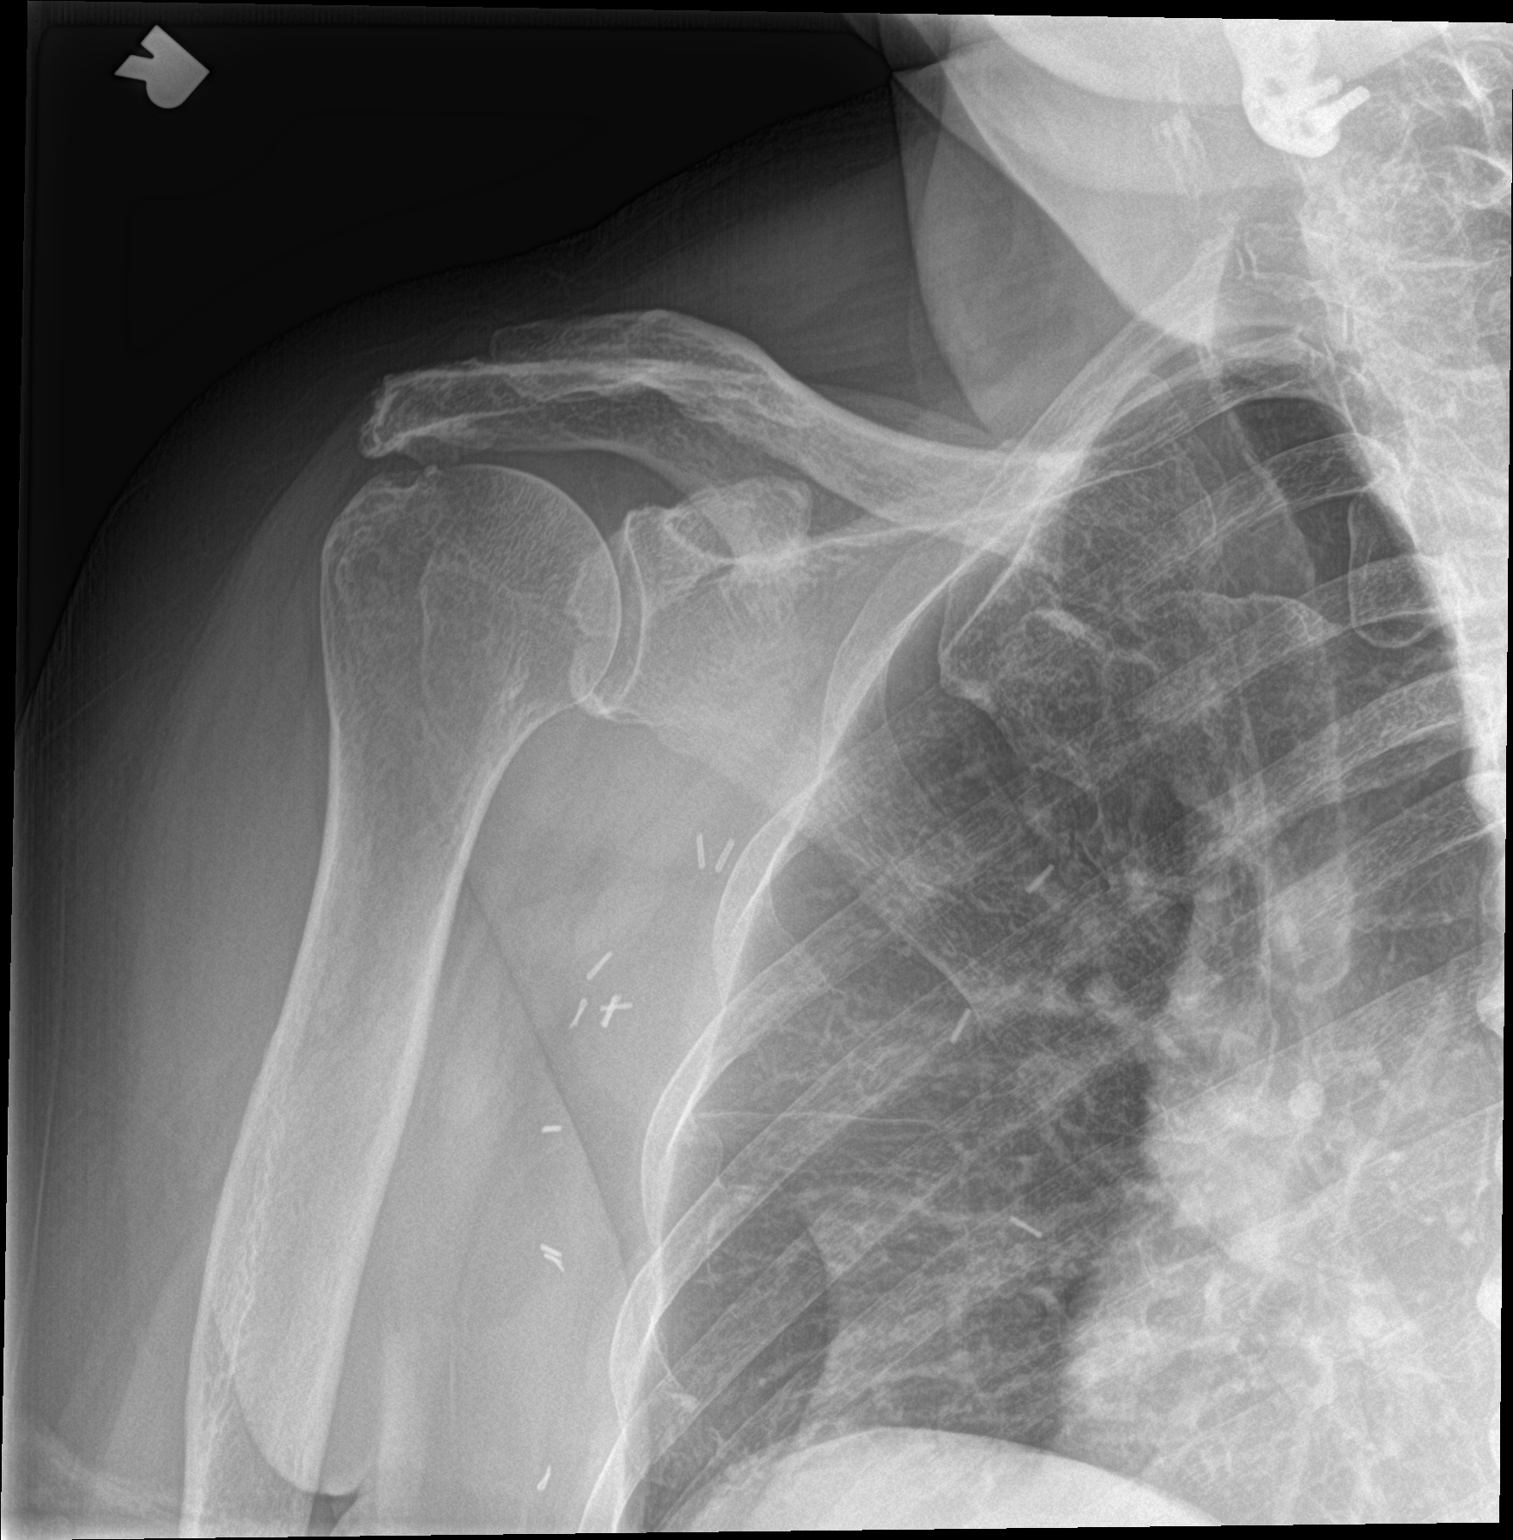

[shoulder y view]
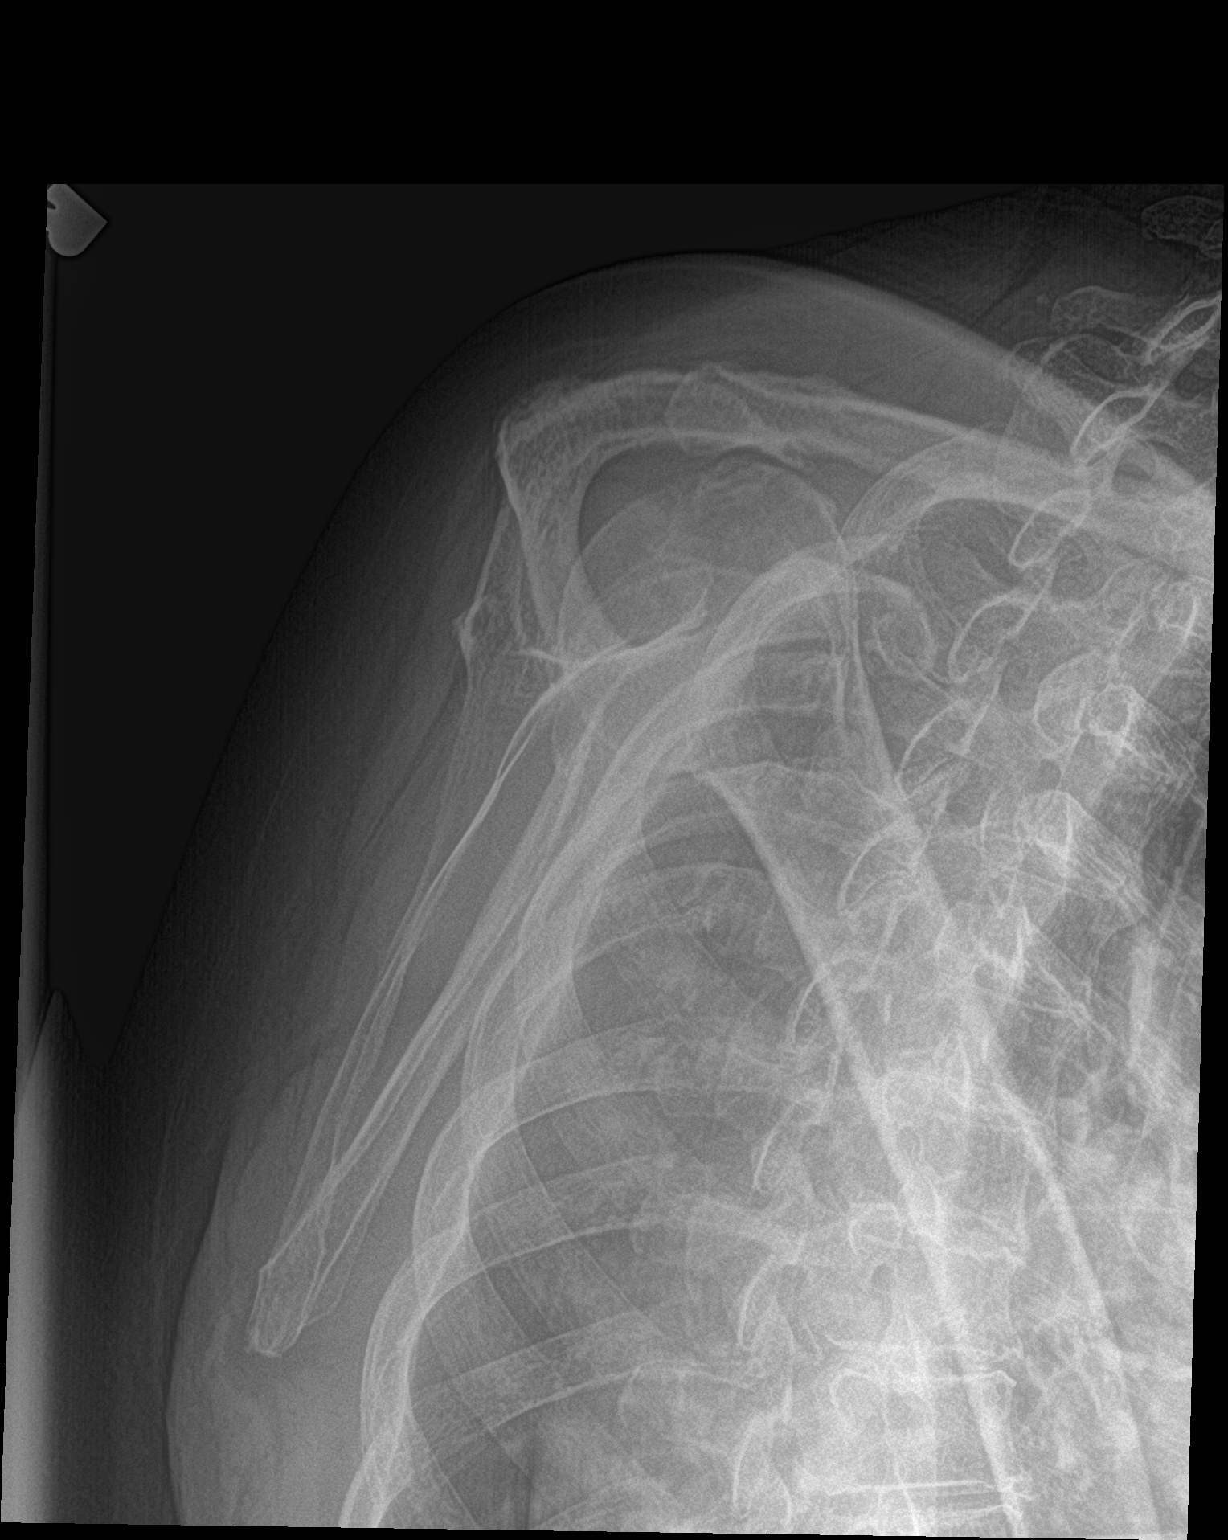

[2 of 2 positions shown; findings below may reference images not displayed]

FINDINGS: No definite acute fracture or dislocation. The bones are osteopenic.
Mild degenerative changes with mild elevation of the humeral head.
The soft tissues are unremarkable. Multiple surgical clips noted in
the right axilla.
IMPRESSION: No definite acute fracture or dislocation.

## 2022-03-01 ENCOUNTER — Other Ambulatory Visit: Payer: Self-pay

## 2022-03-01 ENCOUNTER — Ambulatory Visit: Payer: Medicare HMO | Attending: Family Medicine

## 2022-03-01 DIAGNOSIS — M5459 Other low back pain: Secondary | ICD-10-CM | POA: Diagnosis not present

## 2022-03-01 DIAGNOSIS — R2689 Other abnormalities of gait and mobility: Secondary | ICD-10-CM | POA: Diagnosis not present

## 2022-03-01 DIAGNOSIS — M6281 Muscle weakness (generalized): Secondary | ICD-10-CM | POA: Insufficient documentation

## 2022-03-01 NOTE — Therapy (Signed)
OUTPATIENT PHYSICAL THERAPY THORACOLUMBAR EVALUATION   Patient Name: [name] MRN: [id number] DOB:05/23/1950, 71 y.o., female Today's Date: 03/01/2022  END OF SESSION:  PT End of Session - 03/01/22 1213     Visit Number 1    Number of Visits 17    Date for PT Re-Evaluation 04/26/22    Authorization Type [employer] MCR    PT Start Time 1125    PT Stop Time 1207    PT Time Calculation (min) 42 min    Activity Tolerance Patient tolerated treatment well    Behavior During Therapy [facility] for tasks assessed/performed             Past Medical History:  Diagnosis Date   Anginal pain ([place]) 03/19/2012   saw Dr. Cathie Olden .. she thinks its more related to stomach issues   Arthritis    "all over; mostly in my back" (03/20/2012)   Breast cancer ([place])    "right" (03/20/2012)   Cardiomyopathy    Chest pain 06/2005   Hospitalized, dystolic dysfunction   Chronic lower back pain    "have 2 herniated discs; going to have to have a fusion" (03/20/2012)   CKD (chronic kidney disease), stage III ([place])    Family history of anesthesia complication    "father has nausea and vomiting"   Gallstones    GERD (gastroesophageal reflux disease)    H/O hiatal hernia    History of kidney stones    History of right mastectomy    Hypertension    "patient states never had HTN, takes Hyzaar for heart   Hypothyroidism    Irritable bowel syndrome    Migraines    "it's been a long time since I've had one" (03/20/2012)   Osteoarthritis    Peripheral neuropathy    PONV (postoperative nausea and vomiting)    Sciatica    Type II diabetes mellitus ([place])    Past Surgical History:  Procedure Laterality Date   ABDOMINAL HYSTERECTOMY  1993   adenosin cardiolite  07/2005   (+) wall motion abnormality   AXILLARY LYMPH NODE DISSECTION  2003   squamous cell cancer   BACK SURGERY     CARDIAC CATHETERIZATION  ? 2005 / 2007   CHOLECYSTECTOMY N/A 01/24/2018   Procedure: LAPAROSCOPIC CHOLECYSTECTOMY;   Surgeon: Erroll Luna, MD;  Location: [facility];  Service: General;  Laterality: N/A;   CT abdomen and pelvis  02/2010   Same   ESOPHAGOGASTRODUODENOSCOPY  2005   GERD   KNEE ARTHROSCOPY  1990's   "right" (03/20/2012)   LUMBAR FUSION N/A 08/22/2014   Procedure: Right L2-3 and right L1-2 transforaminal lumbar interbody fusion with pedicle screws, rods, sleeves, cages, local bone graft, Vivigen, cancellous chips;  Surgeon: Jessy Oto, MD;  Location: [facility];  Service: Orthopedics;  Laterality: N/A;   LUMBAR LAMINECTOMY/DECOMPRESSION MICRODISCECTOMY N/A 02/04/2014   Procedure: RIGHT L2-3 MICRODISCECTOMY ;  Surgeon: Jessy Oto, MD;  Location: [facility];  Service: Orthopedics;  Laterality: N/A;   MASTECTOMY Right    MASTECTOMY WITH AXILLARY LYMPH NODE DISSECTION  12/2003   "right" (03/20/2012)   POSTERIOR CERVICAL FUSION/FORAMINOTOMY  2001   SHOULDER ARTHROSCOPY W/ ROTATOR CUFF REPAIR  2003   Left   THYROIDECTOMY  1977   Right   TOTAL KNEE ARTHROPLASTY  2000   Right   [place] of abdomen  07/2005   Fatty liver / kidney stones   Patient Active Problem List   Diagnosis Date Noted   Hammer toe of second  toe of left foot 05/05/2021   Corns and callosities 05/05/2021   Hav (hallux abducto valgus), unspecified laterality 05/05/2021   Hyperlipidemia associated with type 2 diabetes mellitus ([place]) 02/01/2021   Depression 02/01/2021   Localized osteoporosis without current pathological fracture 05/01/2020   Hyperparathyroidism ([place]) 12/13/2019   Elevated hemoglobin ([place]) 12/13/2019   Chronic renal impairment, stage 4 (severe) ([place]) 10/28/2019   Anemia 10/28/2019   Physical exam 06/26/2019   Neck pain 04/18/2019   Ascending aortic aneurysm ([place]) 04/17/2019   Hypoxia    Nonspecific chest pain 04/15/2019   Neuropathic pain of right flank 12/27/2018   Dysphagia 12/27/2018   Postsurgical hypothyroidism 06/11/2018   Vitamin D deficiency 06/11/2018   Multinodular goiter 06/11/2018   Hypercalcemia  06/11/2018   DM type 2 causing vascular disease ([facility])    PONV (postoperative nausea and vomiting)    Peripheral neuropathy    Migraine    Irritable bowel syndrome    Hypertension    History of right mastectomy    H/O hiatal hernia    GERD (gastroesophageal reflux disease)    Family history of anesthesia complication    Diabetes mellitus type 2, uncontrolled, with complications    Lower extremity edema    Chronic constipation 06/23/2015   Chronic diastolic heart failure ([place]) 11/17/2014   Herniated nucleus pulposus, L2-3 right 02/04/2014    Class: Acute   Spondylolisthesis of lumbar region 04/30/2012    Class: Chronic   Lumbar stenosis with neurogenic claudication 04/30/2012    Class: Chronic   SYNCOPE-CAROTID SINUS 09/16/2008   OSTEOARTHRITIS 08/23/2006   DDD (degenerative disc disease), cervical 08/23/2006   DISC DISEASE, LUMBAR 08/23/2006   Headache(784.0) 08/23/2006   BREAST CANCER, HX OF 08/23/2006   HYPERPARATHYROIDISM, HX OF 08/23/2006   RENAL CALCULUS, HX OF 08/23/2006    PCP: [name], MD  REFERRING PROVIDER: Arne Cleveland. Heagen, PA-C  REFERRING DIAG: OTHER spondylosis with myelopathy ,lumbar region   Rationale for Evaluation and Treatment: Rehabilitation  THERAPY DIAG:  Other low back pain  Muscle weakness (generalized)  Other abnormalities of gait and mobility  ONSET DATE: Chronic  SUBJECTIVE:                                                                                                                                                                                           SUBJECTIVE STATEMENT: Pt presents to PT with reports of chronic LBP along with referral of N/T down posterior R LE. Denies bowel/bladder changes or saddle anesthesia. Feels like she has had a decrease in activity tolerance, can only stand for 10 min. Has most pain with prolonged standing and  walking, would like to improve this so she can get back to cooking. She has past hx  of lumbar fusions and had a stimulator implanted last year.   PERTINENT HISTORY:  Hx of lumbar fusions, CKD, Breast cancer, Cardiomyopathy, DM II, R TKA  PAIN:  Are you having pain?  Yes: NPRS scale: 0/10 Worst: 7/10 Pain location: lower back, R LE Pain description: dull ache, N/T Aggravating factors: prolonged standing, walking Relieving factors: rest  PRECAUTIONS: None  WEIGHT BEARING RESTRICTIONS: No  FALLS:  Has patient fallen in last 6 months? No  LIVING ENVIRONMENT: Lives with: lives with their spouse Lives in: House/apartment Stairs: Yes: External: 4 steps; on left going up Has following equipment at home: Single point cane, Environmental consultant - 2 wheeled, and Environmental consultant - 4 wheeled  OCCUPATION: Retired  PLOF: Independent with basic ADLs and Independent with household mobility with device  PATIENT GOALS: improved standing and walking tolerance, decrease pain  OBJECTIVE:   DIAGNOSTIC FINDINGS:  See imaging  PATIENT SURVEYS:  FOTO: 34% function; 43% predicted  COGNITION: Overall cognitive status: Within functional limits for tasks assessed   SENSATION: Light touch: Impaired - decreased light touch R LE  POSTURE: rounded shoulders, forward head, and increased lumbar lordosis  PALPATION: TTP to R posterior hip musculature   LUMBAR ROM:   AROM eval  Flexion 50% reduced  Extension 50% reduced  Right lateral flexion   Left lateral flexion   Right rotation   Left rotation    (Blank rows = not tested)  LOWER EXTREMITY MMT:    MMT Right eval Left eval  Hip flexion 3/5 3+/5  Hip extension    Hip abduction 3/5 3+/5  Hip adduction 3/5 3+/5  Hip internal rotation    Hip external rotation    Knee flexion 3+/5 4/5  Knee extension 3+/5 4/5  Ankle dorsiflexion    Ankle plantarflexion    Ankle inversion    Ankle eversion     (Blank rows = not tested)  LUMBAR SPECIAL TESTS:  Straight leg raise test: Positive and Slump test: Positive  FUNCTIONAL TESTS:  30  Second Sit to Stand: 5 reps  GAIT: Distance walked: 71f Assistive device utilized: Single point cane Level of assistance: Modified independence Comments: slowed, antalgic gait on R LE  TREATMENT: [facility] Adult PT Treatment:                                                DATE: 03/01/2022 Therapeutic Exercise: Seated sciatic nerve glide x 5 R Supine SLR x 5 each Bridge (small range) x 5 LTR x 5 each Seated clamshell x 15 BTB  PATIENT EDUCATION:  Education details: eval findings, FOTO, HEP, POC Person educated: Patient Education method: Explanation, Demonstration, and Handouts Education comprehension: verbalized understanding and returned demonstration  HOME EXERCISE PROGRAM: Access Code: [email or web address]: [email or web address] Date: 03/01/2022 Prepared by: DOctavio Manns Exercises - Seated Sciatic Tensioner  - 1 x daily - 7 x weekly - 2 sets - 10 reps - Active Straight Leg Raise with Quad Set  - 1 x daily - 7 x weekly - 2 sets - 10 reps - Supine Bridge  - 1 x daily - 7 x weekly - 2 sets - 10 reps - Seated Hip Abduction with Resistance  - 1 x daily - 7 x weekly - 3 sets - 15 reps -  Supine Lower Trunk Rotation  - 1 x daily - 7 x weekly - 2 sets - 10 reps  ASSESSMENT:  CLINICAL IMPRESSION: Patient is a 71 y.o. F who was seen today for physical therapy evaluation and treatment for chronic LBP with referral into R LE. Physical findings are consistent with referring provider impression, as pt has significant decrease in LE strength and functional mobility, as well as sensation impairments on affected R LE. Her FOTO score demonstrates sharp decline in subjective physical functioning, indicating she is operating below PLOF. Pt would benefit from skilled PT services working on improving core endurance and proximal hip strength as well as functional activity tolerance in order to decrease pain and improve functional ability.  OBJECTIVE IMPAIRMENTS: decreased activity tolerance,  decreased balance, decreased endurance, decreased mobility, difficulty walking, decreased ROM, decreased strength, and pain.   ACTIVITY LIMITATIONS: carrying, lifting, bending, standing, squatting, stairs, transfers, and locomotion level  PARTICIPATION LIMITATIONS: meal prep, cleaning, driving, shopping, community activity, occupation, and yard work  PERSONAL FACTORS: Fitness, Time since onset of injury/illness/exacerbation, and 1-2 comorbidities: CKD, Breast cancer, Cardiomyopathy, DM II  are also affecting patient's functional outcome.   REHAB POTENTIAL: Good  CLINICAL DECISION MAKING: Evolving/moderate complexity  EVALUATION COMPLEXITY: Moderate   GOALS: Goals reviewed with patient? No  SHORT TERM GOALS: Target date: 03/22/2022   Pt will be compliant and knowledgeable with initial HEP for improved comfort and carryover Baseline: initial HEP given  Goal status: INITIAL  2.  Pt will self report lower back pain no greater than 5/10 for improved comfort and functional ability Baseline: 7/10 at worst Goal status: INITIAL   LONG TERM GOALS: Target date: 04/26/2022   Pt will self report lower back pain no greater than 2/10 for improved comfort and functional ability Baseline: 7/10 at worst Goal status: INITIAL   2.  Pt will improve FOTO function score to no less than 43% as proxy for functional improvement Baseline: 34% function Goal status: INITIAL   3.  Pt will increase 30 Second Sit to Stand rep count to no less than 7 reps (MCID 2) for improved balance, strength, and functional mobility Baseline: 5 reps  Goal status: INITIAL   4.  Pt will increase standing tolerance to no less than 20 minutes without pain for improved functional ability to perform home ADLs Baseline: 10 minutes Goal status: INITIAL  PLAN:  PT FREQUENCY: 2x/week  PT DURATION: 8 weeks  PLANNED INTERVENTIONS: Therapeutic exercises, Therapeutic activity, Neuromuscular re-education, Balance training,  Gait training, Patient/Family education, Self Care, Joint mobilization, Aquatic Therapy, Dry Needling, Electrical stimulation, Cryotherapy, Moist heat, Manual therapy, and Re-evaluation.  PLAN FOR NEXT SESSION: assess HEP response, progress core and proximal hip strength, functional activity tolerance  Referring diagnosis?  OTHER spondylosis with myelopathy ,lumbar region  Treatment diagnosis? (if different than referring diagnosis)  Other low back pain Muscle weakness (generalized) Other abnormalities of gait and mobility What was this (referring dx) caused by? '[x]'$  Surgery '[]'$  Fall '[]'$  Ongoing issue '[x]'$  Arthritis '[]'$  Other: ____________  Laterality: '[x]'$  Rt '[]'$  Lt '[]'$  Both  Check all possible CPT codes:  *CHOOSE 10 OR LESS*    '[x]'$  97110 (Therapeutic Exercise)  '[]'$  92507 (SLP Treatment)  '[x]'$  97112 (Neuro Re-ed)   '[]'$  92526 (Swallowing Treatment)   '[x]'$  97116 (Gait Training)   '[]'$  [id number] (Cognitive Training, 1st 15 minutes) '[x]'$  97140 (Manual Therapy)   '[]'$  97130 (Cognitive Training, each add'l 15 minutes)  '[x]'$  97164 (Re-evaluation)                              '[]'$   Other, List CPT Code ____________  '[x]'$  10289 (Therapeutic Activities)     '[x]'$  02284 (Self Care)   '[]'$  All codes above (97110 - 97535)  '[]'$  97012 (Mechanical Traction)  '[]'$  97014 (E-stim Unattended)  '[]'$  97032 (E-stim manual)  '[]'$  97033 (Ionto)  '[]'$  97035 (Ultrasound) '[]'$  97750 (Physical Performance Training) '[x]'$  [id number] (Aquatic Therapy) '[]'$  97016 (Vasopneumatic Device) '[]'$  [id number] (Paraffin) '[]'$  97034 (Contrast Bath) '[]'$  97597 (Wound Care 1st 20 sq cm) '[]'$  97598 (Wound Care each add'l 20 sq cm) '[]'$  97760 (Orthotic Fabrication, Fitting, Training Initial) '[]'$  [id number] (Prosthetic Management and Training Initial) '[]'$  [id number] (Orthotic or Prosthetic Training/ Modification Subsequent)   Ward Chatters, PT 03/01/2022, 12:17 PM

## 2022-03-07 DIAGNOSIS — Z1331 Encounter for screening for depression: Secondary | ICD-10-CM | POA: Insufficient documentation

## 2022-03-07 IMAGING — MG DIGITAL SCREENING UNILAT LEFT W/ TOMO W/ CAD
8 series · 8 of 24 positions shown · non-contrast
Comparison: Previous exam(s).

CLINICAL DATA: Screening.

EXAM:
DIGITAL SCREENING UNILATERAL LEFT MAMMOGRAM WITH CAD AND TOMO

[L CC synth-2D]
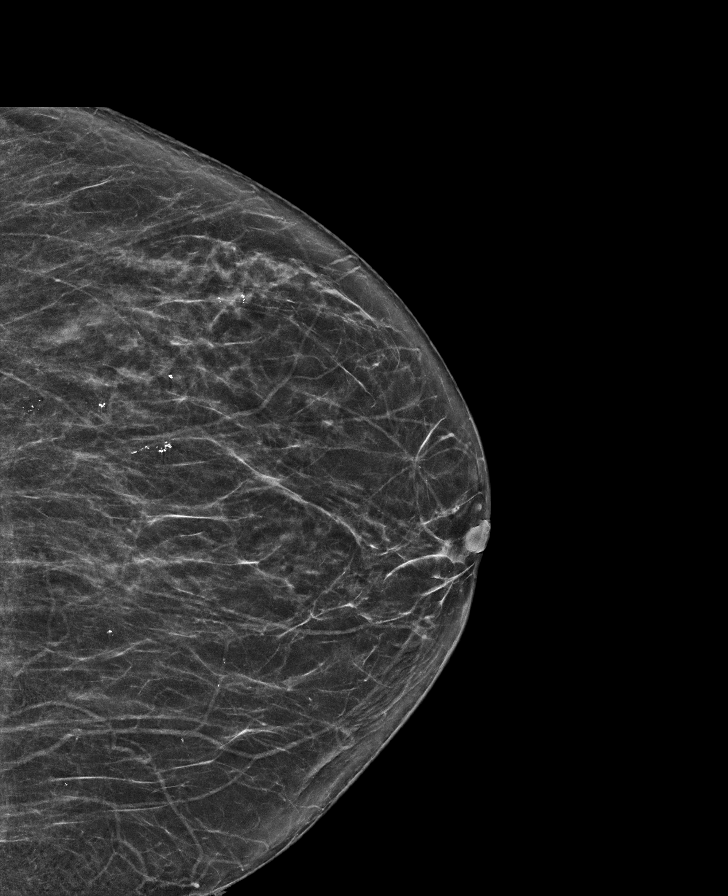

[L MLO synth-2D (1 of 2)]
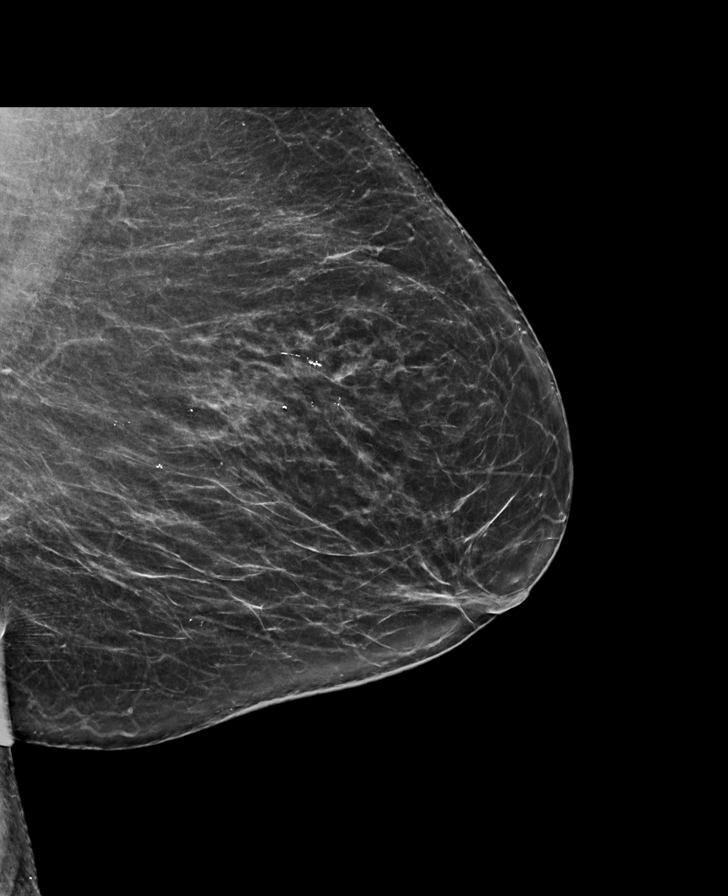

[L MLO synth-2D (2 of 2)]
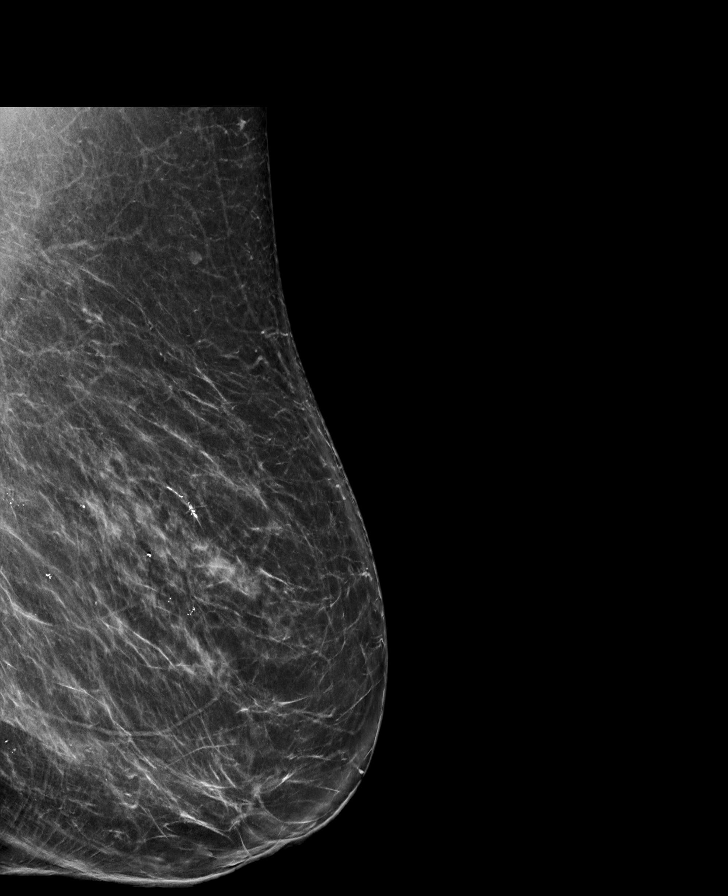

[L CV synth-2D]
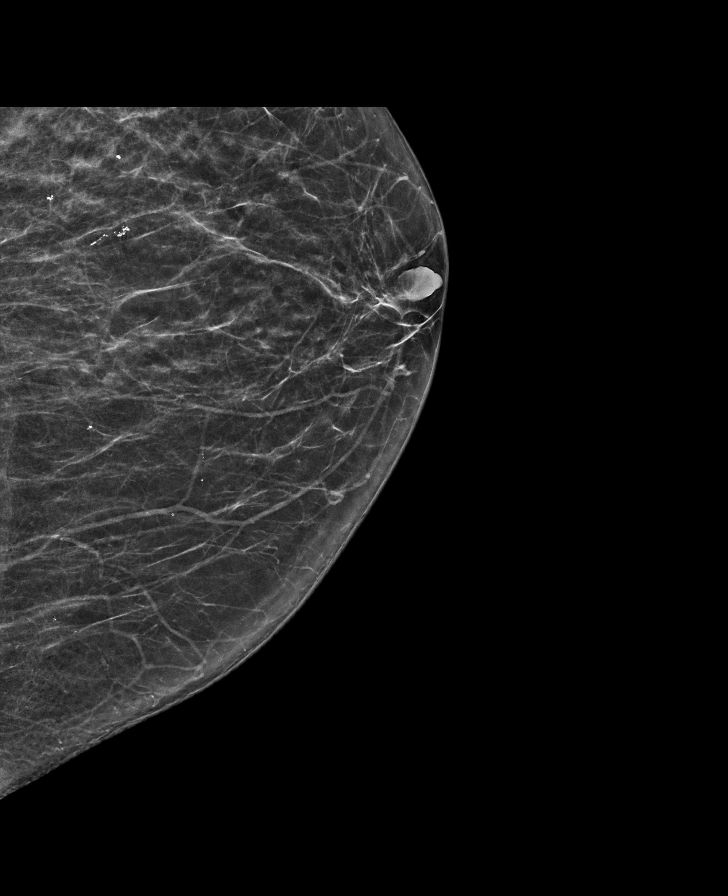

[L CC tomo · tomo slice 34/67.0]
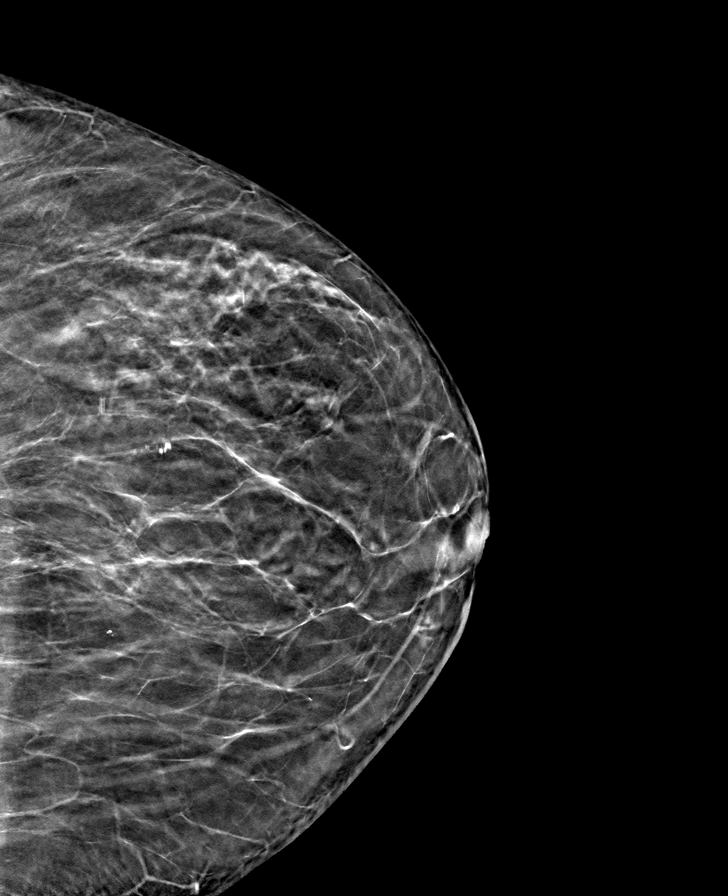

[L MLO tomo (1 of 2) · tomo slice 41/81.0]
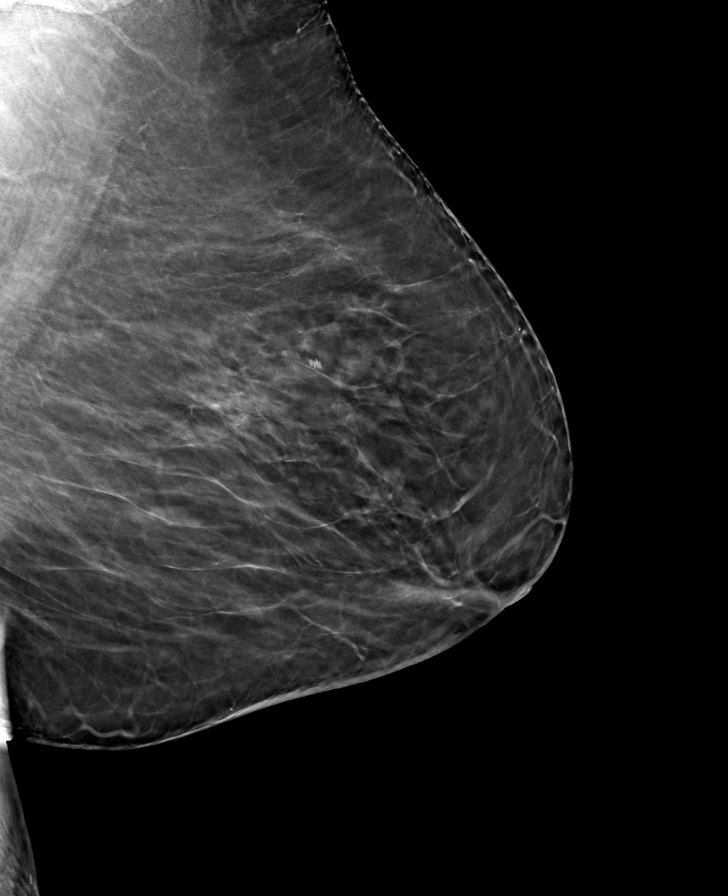

[L CV tomo · tomo slice 33/64.0]
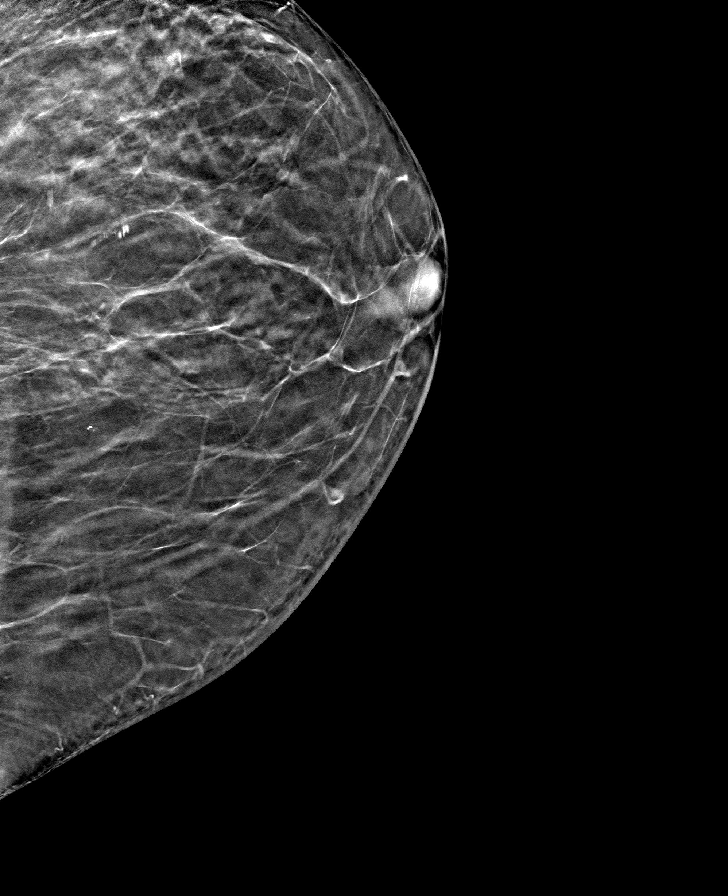

[L MLO tomo (2 of 2) · tomo slice 43/85.0]
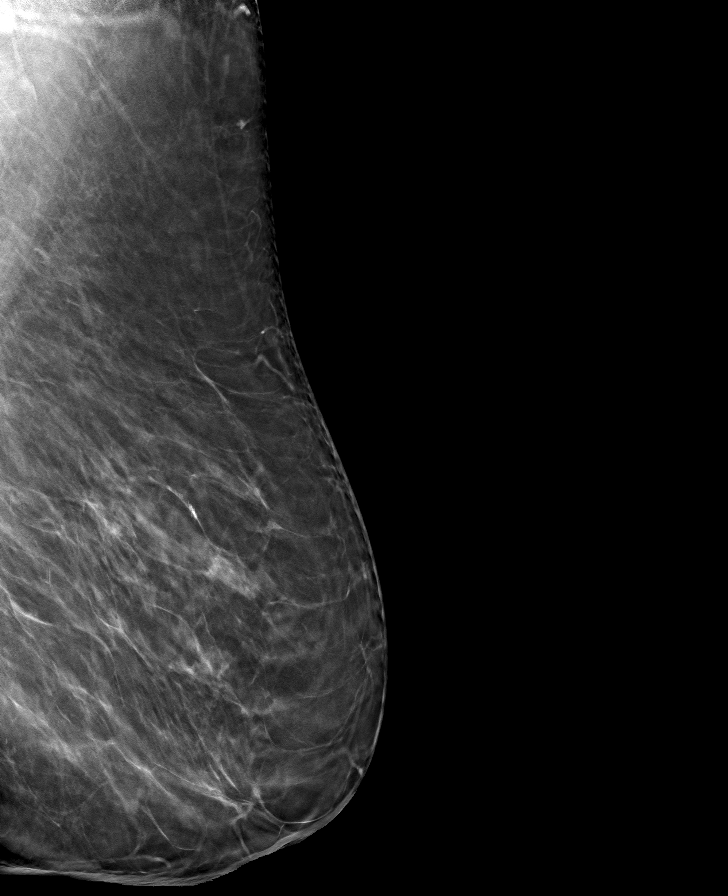

[8 of 24 positions shown; findings below may reference images not displayed]

ACR Breast Density Category b: There are scattered areas of
fibroglandular density.
FINDINGS: The patient has had a right mastectomy. There are no findings
suspicious for malignancy.

Images were processed with CAD.
IMPRESSION: No mammographic evidence of malignancy. A result letter of this
screening mammogram will be mailed directly to the patient.

RECOMMENDATION:
Screening mammogram in one year.  (Code:GY-Q-70Y)

BI-RADS CATEGORY  1: Negative.

## 2022-03-07 NOTE — Assessment & Plan Note (Signed)
Onging back problems.  She is about to start PT which she hopes will help.  Has a spinal stimulator w/o relief.  Will start Cymbalta '20mg'$  daily in hopes of improving pain.  Pt expressed understanding and is in agreement w/ plan.

## 2022-03-07 NOTE — Assessment & Plan Note (Signed)
New.  Pt states her sxs all stem from her chronic LBP.  She is frustrated by her inability to do what she wants to do.  She is starting PT for her back and is hopeful that this will help.  Had long discussion about pain and mood and the interplay between the 2.  She is willing to start low dose Cymbalta in hopes of improving both.  Will follow closely.

## 2022-03-07 NOTE — Assessment & Plan Note (Signed)
Pt's BP is better today since decreasing her Coreg to 1/2 tab BID and decreasing Hydralazine to BID from TID.  She remains on Losartan '25mg'$  daily, Torsemide '20mg'$  BID, and Amlodipine '10mg'$  daily.  BP is not dropping as low and her dizziness is less frequent.  Denies CP or SOB and feels swelling in lower legs/feet has improved.  No med changes at this time.

## 2022-03-09 ENCOUNTER — Other Ambulatory Visit: Payer: Self-pay | Admitting: Family

## 2022-03-09 ENCOUNTER — Ambulatory Visit: Payer: Medicare HMO

## 2022-03-09 DIAGNOSIS — E213 Hyperparathyroidism, unspecified: Secondary | ICD-10-CM

## 2022-03-09 NOTE — Therapy (Signed)
OUTPATIENT PHYSICAL THERAPY TREATMENT NOTE   Patient Name: [name] MRN: [id number] DOB:[birth year], 71 y.o., female Today's Date: 03/10/2022  PCP: [name], MD  REFERRING PROVIDER: Arne Cleveland. Heagen, PA-C   END OF SESSION:   PT End of Session - 03/10/22 1213     Visit Number 2    Number of Visits 17    Date for PT Re-Evaluation 04/26/22    Authorization Type [employer] MCR    PT Start Time 1215    PT Stop Time 1255    PT Time Calculation (min) 40 min    Activity Tolerance Patient tolerated treatment well    Behavior During Therapy WFL for tasks assessed/performed             Past Medical History:  Diagnosis Date   Anginal pain ([place]) 03/19/2012   saw Dr. Cathie Olden .. she thinks its more related to stomach issues   Arthritis    "all over; mostly in my back" (03/20/2012)   Breast cancer ([place])    "right" (03/20/2012)   Cardiomyopathy    Chest pain 06/2005   Hospitalized, dystolic dysfunction   Chronic lower back pain    "have 2 herniated discs; going to have to have a fusion" (03/20/2012)   CKD (chronic kidney disease), stage III ([place])    Family history of anesthesia complication    "father has nausea and vomiting"   Gallstones    GERD (gastroesophageal reflux disease)    H/O hiatal hernia    History of kidney stones    History of right mastectomy    Hypertension    "patient states never had HTN, takes Hyzaar for heart   Hypothyroidism    Irritable bowel syndrome    Migraines    "it's been a long time since I've had one" (03/20/2012)   Osteoarthritis    Peripheral neuropathy    PONV (postoperative nausea and vomiting)    Sciatica    Type II diabetes mellitus ([place])    Past Surgical History:  Procedure Laterality Date   ABDOMINAL HYSTERECTOMY  1993   adenosin cardiolite  07/2005   (+) wall motion abnormality   AXILLARY LYMPH NODE DISSECTION  2003   squamous cell cancer   BACK SURGERY     CARDIAC CATHETERIZATION  ? 2005 / 2007    CHOLECYSTECTOMY N/A 01/24/2018   Procedure: LAPAROSCOPIC CHOLECYSTECTOMY;  Surgeon: Erroll Luna, MD;  Location: [facility];  Service: General;  Laterality: N/A;   CT abdomen and pelvis  02/2010   Same   ESOPHAGOGASTRODUODENOSCOPY  2005   GERD   KNEE ARTHROSCOPY  1990's   "right" (03/20/2012)   LUMBAR FUSION N/A 08/22/2014   Procedure: Right L2-3 and right L1-2 transforaminal lumbar interbody fusion with pedicle screws, rods, sleeves, cages, local bone graft, Vivigen, cancellous chips;  Surgeon: Jessy Oto, MD;  Location: [facility];  Service: Orthopedics;  Laterality: N/A;   LUMBAR LAMINECTOMY/DECOMPRESSION MICRODISCECTOMY N/A 02/04/2014   Procedure: RIGHT L2-3 MICRODISCECTOMY ;  Surgeon: Jessy Oto, MD;  Location: [facility];  Service: Orthopedics;  Laterality: N/A;   MASTECTOMY Right    MASTECTOMY WITH AXILLARY LYMPH NODE DISSECTION  12/2003   "right" (03/20/2012)   POSTERIOR CERVICAL FUSION/FORAMINOTOMY  2001   SHOULDER ARTHROSCOPY W/ ROTATOR CUFF REPAIR  2003   Left   THYROIDECTOMY  1977   Right   TOTAL KNEE ARTHROPLASTY  2000   Right   [place] of abdomen  07/2005   Fatty liver / kidney stones  Patient Active Problem List   Diagnosis Date Noted   Positive screening for depression on 9-item Patient Health Questionnaire (PHQ-9) 03/07/2022   Hammer toe of second toe of left foot 05/05/2021   Corns and callosities 05/05/2021   Hav (hallux abducto valgus), unspecified laterality 05/05/2021   Hyperlipidemia associated with type 2 diabetes mellitus ([place]) 02/01/2021   Depression 02/01/2021   Localized osteoporosis without current pathological fracture 05/01/2020   Hyperparathyroidism ([place]) 12/13/2019   Elevated hemoglobin (HCC) 12/13/2019   Chronic renal impairment, stage 4 (severe) ([place]) 10/28/2019   Anemia 10/28/2019   Physical exam 06/26/2019   Neck pain 04/18/2019   Ascending aortic aneurysm ([place]) 04/17/2019   Hypoxia    Nonspecific chest pain 04/15/2019   Neuropathic pain of right  flank 12/27/2018   Dysphagia 12/27/2018   Postsurgical hypothyroidism 06/11/2018   Vitamin D deficiency 06/11/2018   Multinodular goiter 06/11/2018   Hypercalcemia 06/11/2018   DM type 2 causing vascular disease (HCC)    PONV (postoperative nausea and vomiting)    Peripheral neuropathy    Migraine    Irritable bowel syndrome    Hypertension    History of right mastectomy    H/O hiatal hernia    GERD (gastroesophageal reflux disease)    Family history of anesthesia complication    Other chronic pain    Diabetes mellitus type 2, uncontrolled, with complications    Lower extremity edema    Chronic constipation 06/23/2015   Chronic diastolic heart failure ([place]) 11/17/2014   Herniated nucleus pulposus, L2-3 right 02/04/2014    Class: Acute   Spondylolisthesis of lumbar region 04/30/2012    Class: Chronic   Lumbar stenosis with neurogenic claudication 04/30/2012    Class: Chronic   SYNCOPE-CAROTID SINUS 09/16/2008   OSTEOARTHRITIS 08/23/2006   DDD (degenerative disc disease), cervical 08/23/2006   DISC DISEASE, LUMBAR 08/23/2006   Headache(784.0) 08/23/2006   BREAST CANCER, HX OF 08/23/2006   HYPERPARATHYROIDISM, HX OF 08/23/2006   RENAL CALCULUS, HX OF 08/23/2006    REFERRING DIAG: OTHER spondylosis with myelopathy ,lumbar region    THERAPY DIAG:  Other low back pain  Muscle weakness (generalized)  Other abnormalities of gait and mobility  Rationale for Evaluation and Treatment Rehabilitation  PERTINENT HISTORY: Hx of lumbar fusions, CKD, Breast cancer, Cardiomyopathy, DM II, R TKA   PRECAUTIONS: None   SUBJECTIVE:                                                                                                                                                                                      SUBJECTIVE STATEMENT:  Pt presents to PT with reports of continued LBP. Has been compliant with  HEP with no adverse effect. Pt is ready to begin PT treatment at this time.     PAIN:  Are you having pain?  Yes: NPRS scale: 0/10 Worst: 7/10 Pain location: lower back, R LE Pain description: dull ache, N/T Aggravating factors: prolonged standing, walking Relieving factors: rest   OBJECTIVE: (objective measures completed at initial evaluation unless otherwise dated)  DIAGNOSTIC FINDINGS:  See imaging   PATIENT SURVEYS:  FOTO: 34% function; 43% predicted   COGNITION: Overall cognitive status: Within functional limits for tasks assessed               SENSATION: Light touch: Impaired - decreased light touch R LE   POSTURE: rounded shoulders, forward head, and increased lumbar lordosis   PALPATION: TTP to R posterior hip musculature    LUMBAR ROM:    AROM eval  Flexion 50% reduced  Extension 50% reduced  Right lateral flexion    Left lateral flexion    Right rotation    Left rotation     (Blank rows = not tested)   LOWER EXTREMITY MMT:     MMT Right eval Left eval  Hip flexion 3/5 3+/5  Hip extension      Hip abduction 3/5 3+/5  Hip adduction 3/5 3+/5  Hip internal rotation      Hip external rotation      Knee flexion 3+/5 4/5  Knee extension 3+/5 4/5  Ankle dorsiflexion      Ankle plantarflexion      Ankle inversion      Ankle eversion       (Blank rows = not tested)   LUMBAR SPECIAL TESTS:  Straight leg raise test: Positive and Slump test: Positive   FUNCTIONAL TESTS:  30 Second Sit to Stand: 5 reps   GAIT: Distance walked: 85f Assistive device utilized: Single point cane Level of assistance: Modified independence Comments: slowed, antalgic gait on R LE   TREATMENT: [facility] Adult PT Treatment:                                                DATE: 03/09/2022 Therapeutic Exercise: NuStep lvl 5 UE/LE x 4 min while taking subjective  Seated sciatic nerve glide x 10 each LTR x 10 each Supine SLR x 5 each Bridge (small range) x 5 LTR x 5 each Seated clamshell x 15 BTB  [facility] Adult PT Treatment:                                                 DATE: 03/01/2022 Therapeutic Exercise: Seated sciatic nerve glide x 5 R Supine SLR x 5 each Bridge (small range) x 5 LTR x 5 each Seated clamshell x 15 BTB   PATIENT EDUCATION:  Education details: eval findings, FOTO, HEP, POC Person educated: Patient Education method: Explanation, Demonstration, and Handouts Education comprehension: verbalized understanding and returned demonstration   HOME EXERCISE PROGRAM: Access Code: [email or web address]: [email or web address] Date: 03/01/2022 Prepared by: DOctavio Manns  Exercises - Seated Sciatic Tensioner  - 1 x daily - 7 x weekly - 2 sets - 10 reps - Active Straight Leg Raise with Quad Set  - 1 x daily - 7 x weekly - 2 sets - 10  reps - Supine Bridge  - 1 x daily - 7 x weekly - 2 sets - 10 reps - Seated Hip Abduction with Resistance  - 1 x daily - 7 x weekly - 3 sets - 15 reps - Supine Lower Trunk Rotation  - 1 x daily - 7 x weekly - 2 sets - 10 reps   ASSESSMENT:   CLINICAL IMPRESSION: Pt was able to complete prescribed exercises with no adverse effect, did have an event of aspiration when she drank water and coughed for a while. Therapy focused on improving LE strength in order to decrease pain and improve functional mobility. PT will continue to progress pt as tolerated per POC.    OBJECTIVE IMPAIRMENTS: decreased activity tolerance, decreased balance, decreased endurance, decreased mobility, difficulty walking, decreased ROM, decreased strength, and pain.    ACTIVITY LIMITATIONS: carrying, lifting, bending, standing, squatting, stairs, transfers, and locomotion level   PARTICIPATION LIMITATIONS: meal prep, cleaning, driving, shopping, community activity, occupation, and yard work   PERSONAL FACTORS: Fitness, Time since onset of injury/illness/exacerbation, and 1-2 comorbidities: CKD, Breast cancer, Cardiomyopathy, DM II  are also affecting patient's functional outcome.      GOALS: Goals reviewed with  patient? No   SHORT TERM GOALS: Target date: 03/22/2022   Pt will be compliant and knowledgeable with initial HEP for improved comfort and carryover Baseline: initial HEP given  Goal status: INITIAL   2.  Pt will self report lower back pain no greater than 5/10 for improved comfort and functional ability Baseline: 7/10 at worst Goal status: INITIAL    LONG TERM GOALS: Target date: 04/26/2022   Pt will self report lower back pain no greater than 2/10 for improved comfort and functional ability Baseline: 7/10 at worst Goal status: INITIAL    2.  Pt will improve FOTO function score to no less than 43% as proxy for functional improvement Baseline: 34% function Goal status: INITIAL    3.  Pt will increase 30 Second Sit to Stand rep count to no less than 7 reps (MCID 2) for improved balance, strength, and functional mobility Baseline: 5 reps  Goal status: INITIAL    4.  Pt will increase standing tolerance to no less than 20 minutes without pain for improved functional ability to perform home ADLs Baseline: 10 minutes Goal status: INITIAL   PLAN:   PT FREQUENCY: 2x/week   PT DURATION: 8 weeks   PLANNED INTERVENTIONS: Therapeutic exercises, Therapeutic activity, Neuromuscular re-education, Balance training, Gait training, Patient/Family education, Self Care, Joint mobilization, Aquatic Therapy, Dry Needling, Electrical stimulation, Cryotherapy, Moist heat, Manual therapy, and Re-evaluation.   PLAN FOR NEXT SESSION: assess HEP response, progress core and proximal hip strength, functional activity tolerance   Ward Chatters, PT 03/10/2022, 1:35 PM

## 2022-03-10 ENCOUNTER — Ambulatory Visit: Payer: Medicare HMO

## 2022-03-10 DIAGNOSIS — M5459 Other low back pain: Secondary | ICD-10-CM | POA: Diagnosis not present

## 2022-03-10 DIAGNOSIS — R2689 Other abnormalities of gait and mobility: Secondary | ICD-10-CM

## 2022-03-10 DIAGNOSIS — M6281 Muscle weakness (generalized): Secondary | ICD-10-CM

## 2022-03-11 ENCOUNTER — Other Ambulatory Visit: Payer: Self-pay | Admitting: Family Medicine

## 2022-03-15 ENCOUNTER — Ambulatory Visit: Payer: Medicare HMO | Attending: Family Medicine

## 2022-03-15 DIAGNOSIS — M6281 Muscle weakness (generalized): Secondary | ICD-10-CM | POA: Diagnosis not present

## 2022-03-15 DIAGNOSIS — R2689 Other abnormalities of gait and mobility: Secondary | ICD-10-CM

## 2022-03-15 DIAGNOSIS — M5459 Other low back pain: Secondary | ICD-10-CM

## 2022-03-15 NOTE — Therapy (Signed)
OUTPATIENT PHYSICAL THERAPY TREATMENT NOTE   Patient Name: [name] MRN: [id number] DOB:1950-09-07, 71 y.o., female Today's Date: 03/15/2022  PCP: [name], MD  REFERRING PROVIDER: Arne Cleveland. Heagen, PA-C   END OF SESSION:   PT End of Session - 03/15/22 1530     Visit Number 3    Number of Visits 17    Date for PT Re-Evaluation 04/26/22    Authorization Type [employer] MCR    PT Start Time 1530    PT Stop Time 1610    PT Time Calculation (min) 40 min    Activity Tolerance Patient tolerated treatment well    Behavior During Therapy WFL for tasks assessed/performed              Past Medical History:  Diagnosis Date   Anginal pain ([place]) 03/19/2012   saw Dr. Cathie Olden .. she thinks its more related to stomach issues   Arthritis    "all over; mostly in my back" (03/20/2012)   Breast cancer ([place])    "right" (03/20/2012)   Cardiomyopathy    Chest pain 06/2005   Hospitalized, dystolic dysfunction   Chronic lower back pain    "have 2 herniated discs; going to have to have a fusion" (03/20/2012)   CKD (chronic kidney disease), stage III ([place])    Family history of anesthesia complication    "father has nausea and vomiting"   Gallstones    GERD (gastroesophageal reflux disease)    H/O hiatal hernia    History of kidney stones    History of right mastectomy    Hypertension    "patient states never had HTN, takes Hyzaar for heart   Hypothyroidism    Irritable bowel syndrome    Migraines    "it's been a long time since I've had one" (03/20/2012)   Osteoarthritis    Peripheral neuropathy    PONV (postoperative nausea and vomiting)    Sciatica    Type II diabetes mellitus ([place])    Past Surgical History:  Procedure Laterality Date   ABDOMINAL HYSTERECTOMY  1993   adenosin cardiolite  07/2005   (+) wall motion abnormality   AXILLARY LYMPH NODE DISSECTION  2003   squamous cell cancer   BACK SURGERY     CARDIAC CATHETERIZATION  ? 2005 / 2007    CHOLECYSTECTOMY N/A 01/24/2018   Procedure: LAPAROSCOPIC CHOLECYSTECTOMY;  Surgeon: Erroll Luna, MD;  Location: [facility];  Service: General;  Laterality: N/A;   CT abdomen and pelvis  02/2010   Same   ESOPHAGOGASTRODUODENOSCOPY  2005   GERD   KNEE ARTHROSCOPY  1990's   "right" (03/20/2012)   LUMBAR FUSION N/A 08/22/2014   Procedure: Right L2-3 and right L1-2 transforaminal lumbar interbody fusion with pedicle screws, rods, sleeves, cages, local bone graft, Vivigen, cancellous chips;  Surgeon: Jessy Oto, MD;  Location: [facility];  Service: Orthopedics;  Laterality: N/A;   LUMBAR LAMINECTOMY/DECOMPRESSION MICRODISCECTOMY N/A 02/04/2014   Procedure: RIGHT L2-3 MICRODISCECTOMY ;  Surgeon: Jessy Oto, MD;  Location: [facility];  Service: Orthopedics;  Laterality: N/A;   MASTECTOMY Right    MASTECTOMY WITH AXILLARY LYMPH NODE DISSECTION  12/2003   "right" (03/20/2012)   POSTERIOR CERVICAL FUSION/FORAMINOTOMY  2001   SHOULDER ARTHROSCOPY W/ ROTATOR CUFF REPAIR  2003   Left   THYROIDECTOMY  1977   Right   TOTAL KNEE ARTHROPLASTY  2000   Right   [place] of abdomen  07/2005   Fatty liver / kidney stones  Patient Active Problem List   Diagnosis Date Noted   Positive screening for depression on 9-item Patient Health Questionnaire (PHQ-9) 03/07/2022   Hammer toe of second toe of left foot 05/05/2021   Corns and callosities 05/05/2021   Hav (hallux abducto valgus), unspecified laterality 05/05/2021   Hyperlipidemia associated with type 2 diabetes mellitus ([place]) 02/01/2021   Depression 02/01/2021   Localized osteoporosis without current pathological fracture 05/01/2020   Hyperparathyroidism ([place]) 12/13/2019   Elevated hemoglobin ([facility]) 12/13/2019   Chronic renal impairment, stage 4 (severe) ([place]) 10/28/2019   Anemia 10/28/2019   Physical exam 06/26/2019   Neck pain 04/18/2019   Ascending aortic aneurysm ([place]) 04/17/2019   Hypoxia    Nonspecific chest pain 04/15/2019   Neuropathic pain of right  flank 12/27/2018   Dysphagia 12/27/2018   Postsurgical hypothyroidism 06/11/2018   Vitamin D deficiency 06/11/2018   Multinodular goiter 06/11/2018   Hypercalcemia 06/11/2018   DM type 2 causing vascular disease (H[facility])    PONV (postoperative nausea and vomiting)    Peripheral neuropathy    Migraine    Irritable bowel syndrome    Hypertension    History of right mastectomy    H/O hiatal hernia    GERD (gastroesophageal reflux disease)    Family history of anesthesia complication    Other chronic pain    Diabetes mellitus type 2, uncontrolled, with complications    Lower extremity edema    Chronic constipation 06/23/2015   Chronic diastolic heart failure ([place]) 11/17/2014   Herniated nucleus pulposus, L2-3 right 02/04/2014    Class: Acute   Spondylolisthesis of lumbar region 04/30/2012    Class: Chronic   Lumbar stenosis with neurogenic claudication 04/30/2012    Class: Chronic   SYNCOPE-CAROTID SINUS 09/16/2008   OSTEOARTHRITIS 08/23/2006   DDD (degenerative disc disease), cervical 08/23/2006   DISC DISEASE, LUMBAR 08/23/2006   Headache(784.0) 08/23/2006   BREAST CANCER, HX OF 08/23/2006   HYPERPARATHYROIDISM, HX OF 08/23/2006   RENAL CALCULUS, HX OF 08/23/2006    REFERRING DIAG: OTHER spondylosis with myelopathy ,lumbar region    THERAPY DIAG:  Other low back pain  Muscle weakness (generalized)  Other abnormalities of gait and mobility  Rationale for Evaluation and Treatment Rehabilitation  PERTINENT HISTORY: Hx of lumbar fusions, CKD, Breast cancer, Cardiomyopathy, DM II, R TKA   PRECAUTIONS: None   SUBJECTIVE:                                                                                                                                                                                      SUBJECTIVE STATEMENT:  Pt presents to PT with reports of increased pain in lower back. She  has been compliant with HEP with no adverse effect. Pt is ready to begin PT at this  time.    PAIN:  Are you having pain?  Yes: NPRS scale: 9/10 Worst: 7/10 Pain location: lower back, R LE Pain description: dull ache, N/T Aggravating factors: prolonged standing, walking Relieving factors: rest   OBJECTIVE: (objective measures completed at initial evaluation unless otherwise dated)  DIAGNOSTIC FINDINGS:  See imaging   PATIENT SURVEYS:  FOTO: 34% function; 43% predicted   COGNITION: Overall cognitive status: Within functional limits for tasks assessed               SENSATION: Light touch: Impaired - decreased light touch R LE   POSTURE: rounded shoulders, forward head, and increased lumbar lordosis   PALPATION: TTP to R posterior hip musculature    LUMBAR ROM:    AROM eval  Flexion 50% reduced  Extension 50% reduced  Right lateral flexion    Left lateral flexion    Right rotation    Left rotation     (Blank rows = not tested)   LOWER EXTREMITY MMT:     MMT Right eval Left eval  Hip flexion 3/5 3+/5  Hip extension      Hip abduction 3/5 3+/5  Hip adduction 3/5 3+/5  Hip internal rotation      Hip external rotation      Knee flexion 3+/5 4/5  Knee extension 3+/5 4/5  Ankle dorsiflexion      Ankle plantarflexion      Ankle inversion      Ankle eversion       (Blank rows = not tested)   LUMBAR SPECIAL TESTS:  Straight leg raise test: Positive and Slump test: Positive   FUNCTIONAL TESTS:  30 Second Sit to Stand: 5 reps   GAIT: Distance walked: 64f Assistive device utilized: Single point cane Level of assistance: Modified independence Comments: slowed, antalgic gait on R LE   TREATMENT: [facility] Adult PT Treatment:                                                DATE: 03/15/2022 Therapeutic Exercise: NuStep lvl 5 UE/LE x 4 min while taking subjective  Seated sciatic nerve glide x 10 each LTR x 10 each Supine SLR x 10 each Seated ball squeeze 2x10 - 3" hold Seated clamshell 2x15 BTB Seated march 2x20 BTB LAQ 2x10 2#  [facility] Adult  PT Treatment:                                                DATE: 03/09/2022 Therapeutic Exercise: NuStep lvl 5 UE/LE x 4 min while taking subjective  Seated sciatic nerve glide x 10 each LTR x 10 each Supine SLR x 5 each Bridge (small range) x 5 LTR x 5 each Seated clamshell x 15 BTB  [facility] Adult PT Treatment:                                                DATE: 03/01/2022 Therapeutic Exercise: Seated sciatic nerve glide x 5 R Supine SLR x  5 each Bridge (small range) x 5 LTR x 5 each Seated clamshell x 15 BTB   PATIENT EDUCATION:  Education details: eval findings, FOTO, HEP, POC Person educated: Patient Education method: Explanation, Demonstration, and Handouts Education comprehension: verbalized understanding and returned demonstration   HOME EXERCISE PROGRAM: Access Code: [email or web address] URL: [email or web address] Date: 03/01/2022 Prepared by: Octavio Manns   Exercises - Seated Sciatic Tensioner  - 1 x daily - 7 x weekly - 2 sets - 10 reps - Active Straight Leg Raise with Quad Set  - 1 x daily - 7 x weekly - 2 sets - 10 reps - Supine Bridge  - 1 x daily - 7 x weekly - 2 sets - 10 reps - Seated Hip Abduction with Resistance  - 1 x daily - 7 x weekly - 3 sets - 15 reps - Supine Lower Trunk Rotation  - 1 x daily - 7 x weekly - 2 sets - 10 reps   ASSESSMENT:   CLINICAL IMPRESSION: Pt was able to complete prescribed exercises with no adverse effect, although she was limited today secondary to increased pain. Therapy focused on improving LE strength in order to decrease pain and improve functional mobility. PT will continue to progress pt as tolerated per POC.    OBJECTIVE IMPAIRMENTS: decreased activity tolerance, decreased balance, decreased endurance, decreased mobility, difficulty walking, decreased ROM, decreased strength, and pain.    ACTIVITY LIMITATIONS: carrying, lifting, bending, standing, squatting, stairs, transfers, and locomotion level   PARTICIPATION  LIMITATIONS: meal prep, cleaning, driving, shopping, community activity, occupation, and yard work   PERSONAL FACTORS: Fitness, Time since onset of injury/illness/exacerbation, and 1-2 comorbidities: CKD, Breast cancer, Cardiomyopathy, DM II  are also affecting patient's functional outcome.      GOALS: Goals reviewed with patient? No   SHORT TERM GOALS: Target date: 03/22/2022   Pt will be compliant and knowledgeable with initial HEP for improved comfort and carryover Baseline: initial HEP given  Goal status: INITIAL   2.  Pt will self report lower back pain no greater than 5/10 for improved comfort and functional ability Baseline: 7/10 at worst Goal status: INITIAL    LONG TERM GOALS: Target date: 04/26/2022   Pt will self report lower back pain no greater than 2/10 for improved comfort and functional ability Baseline: 7/10 at worst Goal status: INITIAL    2.  Pt will improve FOTO function score to no less than 43% as proxy for functional improvement Baseline: 34% function Goal status: INITIAL    3.  Pt will increase 30 Second Sit to Stand rep count to no less than 7 reps (MCID 2) for improved balance, strength, and functional mobility Baseline: 5 reps  Goal status: INITIAL    4.  Pt will increase standing tolerance to no less than 20 minutes without pain for improved functional ability to perform home ADLs Baseline: 10 minutes Goal status: INITIAL   PLAN:   PT FREQUENCY: 2x/week   PT DURATION: 8 weeks   PLANNED INTERVENTIONS: Therapeutic exercises, Therapeutic activity, Neuromuscular re-education, Balance training, Gait training, Patient/Family education, Self Care, Joint mobilization, Aquatic Therapy, Dry Needling, Electrical stimulation, Cryotherapy, Moist heat, Manual therapy, and Re-evaluation.   PLAN FOR NEXT SESSION: assess HEP response, progress core and proximal hip strength, functional activity tolerance   Ward Chatters, PT 03/15/2022, 4:16 PM

## 2022-03-17 ENCOUNTER — Ambulatory Visit: Payer: Medicare HMO

## 2022-03-17 ENCOUNTER — Ambulatory Visit (INDEPENDENT_AMBULATORY_CARE_PROVIDER_SITE_OTHER): Payer: Medicare HMO | Admitting: Family Medicine

## 2022-03-17 ENCOUNTER — Encounter: Payer: Self-pay | Admitting: Family Medicine

## 2022-03-17 ENCOUNTER — Telehealth: Payer: Self-pay

## 2022-03-17 VITALS — BP 124/64 | HR 44 | Temp 97.2°F | Resp 16 | Ht 64.0 in | Wt 185.2 lb

## 2022-03-17 DIAGNOSIS — E039 Hypothyroidism, unspecified: Secondary | ICD-10-CM

## 2022-03-17 DIAGNOSIS — E1122 Type 2 diabetes mellitus with diabetic chronic kidney disease: Secondary | ICD-10-CM

## 2022-03-17 DIAGNOSIS — G8929 Other chronic pain: Secondary | ICD-10-CM

## 2022-03-17 DIAGNOSIS — Z1331 Encounter for screening for depression: Secondary | ICD-10-CM | POA: Diagnosis not present

## 2022-03-17 DIAGNOSIS — Z794 Long term (current) use of insulin: Secondary | ICD-10-CM

## 2022-03-17 DIAGNOSIS — N184 Chronic kidney disease, stage 4 (severe): Secondary | ICD-10-CM

## 2022-03-17 LAB — HEMOGLOBIN A1C: Hgb A1c MFr Bld: 8.3 % — ABNORMAL HIGH (ref 4.6–6.5)

## 2022-03-17 MED ORDER — LEVOTHYROXINE SODIUM 100 MCG PO TABS: 100.0000 ug | ORAL_TABLET | Freq: Every day | ORAL | 3 refills | Status: DC

## 2022-03-17 MED ORDER — TIZANIDINE HCL 2 MG PO CAPS: 2.0000 mg | ORAL_CAPSULE | Freq: Three times a day (TID) | ORAL | 1 refills | Status: DC | PRN

## 2022-03-17 MED ORDER — NOVOLIN 70/30 (70-30) 100 UNIT/ML ~~LOC~~ SUSP: 30.0000 [IU] | Freq: Two times a day (BID) | SUBCUTANEOUS | 1 refills | Status: DC

## 2022-03-17 NOTE — Therapy (Incomplete)
OUTPATIENT PHYSICAL THERAPY TREATMENT NOTE   Patient Name: [name] MRN: [id number] DOB:[birth year], 71 y.o., female Today's Date: 03/17/2022  PCP: [name], MD  REFERRING PROVIDER: Arne Cleveland. Heagen, PA-C   END OF SESSION:      Past Medical History:  Diagnosis Date   Anginal pain ([place]) 03/19/2012   saw Dr. Cathie Olden .. she thinks its more related to stomach issues   Arthritis    "all over; mostly in my back" (03/20/2012)   Breast cancer ([place])    "right" (03/20/2012)   Cardiomyopathy    Chest pain 06/2005   Hospitalized, dystolic dysfunction   Chronic lower back pain    "have 2 herniated discs; going to have to have a fusion" (03/20/2012)   CKD (chronic kidney disease), stage III ([place])    Family history of anesthesia complication    "father has nausea and vomiting"   Gallstones    GERD (gastroesophageal reflux disease)    H/O hiatal hernia    History of kidney stones    History of right mastectomy    Hypertension    "patient states never had HTN, takes Hyzaar for heart   Hypothyroidism    Irritable bowel syndrome    Migraines    "it's been a long time since I've had one" (03/20/2012)   Osteoarthritis    Peripheral neuropathy    PONV (postoperative nausea and vomiting)    Sciatica    Type II diabetes mellitus ([place])    Past Surgical History:  Procedure Laterality Date   ABDOMINAL HYSTERECTOMY  1993   adenosin cardiolite  07/2005   (+) wall motion abnormality   AXILLARY LYMPH NODE DISSECTION  2003   squamous cell cancer   BACK SURGERY     CARDIAC CATHETERIZATION  ? 2005 / 2007   CHOLECYSTECTOMY N/A 01/24/2018   Procedure: LAPAROSCOPIC CHOLECYSTECTOMY;  Surgeon: Erroll Luna, MD;  Location: [facility];  Service: General;  Laterality: N/A;   CT abdomen and pelvis  02/2010   Same   ESOPHAGOGASTRODUODENOSCOPY  2005   GERD   KNEE ARTHROSCOPY  1990's   "right" (03/20/2012)   LUMBAR FUSION N/A 08/22/2014   Procedure: Right L2-3 and right L1-2  transforaminal lumbar interbody fusion with pedicle screws, rods, sleeves, cages, local bone graft, Vivigen, cancellous chips;  Surgeon: Jessy Oto, MD;  Location: [facility];  Service: Orthopedics;  Laterality: N/A;   LUMBAR LAMINECTOMY/DECOMPRESSION MICRODISCECTOMY N/A 02/04/2014   Procedure: RIGHT L2-3 MICRODISCECTOMY ;  Surgeon: Jessy Oto, MD;  Location: [facility];  Service: Orthopedics;  Laterality: N/A;   MASTECTOMY Right    MASTECTOMY WITH AXILLARY LYMPH NODE DISSECTION  12/2003   "right" (03/20/2012)   POSTERIOR CERVICAL FUSION/FORAMINOTOMY  2001   SHOULDER ARTHROSCOPY W/ ROTATOR CUFF REPAIR  2003   Left   THYROIDECTOMY  1977   Right   TOTAL KNEE ARTHROPLASTY  2000   Right   [place] of abdomen  07/2005   Fatty liver / kidney stones   Patient Active Problem List   Diagnosis Date Noted   Positive screening for depression on 9-item Patient Health Questionnaire (PHQ-9) 03/07/2022   Hammer toe of second toe of left foot 05/05/2021   Corns and callosities 05/05/2021   Hav (hallux abducto valgus), unspecified laterality 05/05/2021   Hyperlipidemia associated with type 2 diabetes mellitus ([place]) 02/01/2021   Depression 02/01/2021   Localized osteoporosis without current pathological fracture 05/01/2020   Hyperparathyroidism ([place]) 12/13/2019   Elevated hemoglobin ([facility]) 12/13/2019   Chronic renal  impairment, stage 4 (severe) ([place]) 10/28/2019   Anemia 10/28/2019   Physical exam 06/26/2019   Neck pain 04/18/2019   Ascending aortic aneurysm ([place]) 04/17/2019   Hypoxia    Nonspecific chest pain 04/15/2019   Neuropathic pain of right flank 12/27/2018   Dysphagia 12/27/2018   Postsurgical hypothyroidism 06/11/2018   Vitamin D deficiency 06/11/2018   Multinodular goiter 06/11/2018   Hypercalcemia 06/11/2018   DM type 2 causing vascular disease ([facility])    PONV (postoperative nausea and vomiting)    Peripheral neuropathy    Migraine    Irritable bowel syndrome    Hypertension    History of  right mastectomy    H/O hiatal hernia    GERD (gastroesophageal reflux disease)    Family history of anesthesia complication    Other chronic pain    Diabetes mellitus type 2, uncontrolled, with complications    Lower extremity edema    Chronic constipation 06/23/2015   Chronic diastolic heart failure ([place]) 11/17/2014   Herniated nucleus pulposus, L2-3 right 02/04/2014    Class: Acute   Spondylolisthesis of lumbar region 04/30/2012    Class: Chronic   Lumbar stenosis with neurogenic claudication 04/30/2012    Class: Chronic   SYNCOPE-CAROTID SINUS 09/16/2008   OSTEOARTHRITIS 08/23/2006   DDD (degenerative disc disease), cervical 08/23/2006   DISC DISEASE, LUMBAR 08/23/2006   Headache(784.0) 08/23/2006   BREAST CANCER, HX OF 08/23/2006   HYPERPARATHYROIDISM, HX OF 08/23/2006   RENAL CALCULUS, HX OF 08/23/2006    REFERRING DIAG: OTHER spondylosis with myelopathy ,lumbar region    THERAPY DIAG:  No diagnosis found.  Rationale for Evaluation and Treatment Rehabilitation  PERTINENT HISTORY: Hx of lumbar fusions, CKD, Breast cancer, Cardiomyopathy, DM II, R TKA   PRECAUTIONS: None   SUBJECTIVE:                                                                                                                                                                                      SUBJECTIVE STATEMENT:  ***   PAIN:  Are you having pain?  Yes: NPRS scale: 9/10 Worst: 7/10 Pain location: lower back, R LE Pain description: dull ache, N/T Aggravating factors: prolonged standing, walking Relieving factors: rest   OBJECTIVE: (objective measures completed at initial evaluation unless otherwise dated)  DIAGNOSTIC FINDINGS:  See imaging   PATIENT SURVEYS:  FOTO: 34% function; 43% predicted   COGNITION: Overall cognitive status: Within functional limits for tasks assessed               SENSATION: Light touch: Impaired - decreased light touch R LE   POSTURE: rounded shoulders,  forward head, and increased lumbar lordosis  PALPATION: TTP to R posterior hip musculature    LUMBAR ROM:    AROM eval  Flexion 50% reduced  Extension 50% reduced  Right lateral flexion    Left lateral flexion    Right rotation    Left rotation     (Blank rows = not tested)   LOWER EXTREMITY MMT:     MMT Right eval Left eval  Hip flexion 3/5 3+/5  Hip extension      Hip abduction 3/5 3+/5  Hip adduction 3/5 3+/5  Hip internal rotation      Hip external rotation      Knee flexion 3+/5 4/5  Knee extension 3+/5 4/5  Ankle dorsiflexion      Ankle plantarflexion      Ankle inversion      Ankle eversion       (Blank rows = not tested)   LUMBAR SPECIAL TESTS:  Straight leg raise test: Positive and Slump test: Positive   FUNCTIONAL TESTS:  30 Second Sit to Stand: 5 reps   GAIT: Distance walked: 22f Assistive device utilized: Single point cane Level of assistance: Modified independence Comments: slowed, antalgic gait on R LE   TREATMENT: [facility] Adult PT Treatment:                                                DATE: 03/17/2022 Therapeutic Exercise: NuStep lvl 5 UE/LE x 4 min while taking subjective  Seated sciatic nerve glide x 10 each LTR x 10 each Supine SLR x 10 each Seated ball squeeze 2x10 - 3" hold Seated clamshell 2x15 BTB Seated march 2x20 BTB LAQ 2x10 2#  [facility] Adult PT Treatment:                                                DATE: 03/15/2022 Therapeutic Exercise: NuStep lvl 5 UE/LE x 4 min while taking subjective  Seated sciatic nerve glide x 10 each LTR x 10 each Supine SLR x 10 each Seated ball squeeze 2x10 - 3" hold Seated clamshell 2x15 BTB Seated march 2x20 BTB LAQ 2x10 2#  [facility] Adult PT Treatment:                                                DATE: 03/09/2022 Therapeutic Exercise: NuStep lvl 5 UE/LE x 4 min while taking subjective  Seated sciatic nerve glide x 10 each LTR x 10 each Supine SLR x 5 each Bridge (small range) x 5 LTR x 5  each Seated clamshell x 15 BTB  [facility] Adult PT Treatment:                                                DATE: 03/01/2022 Therapeutic Exercise: Seated sciatic nerve glide x 5 R Supine SLR x 5 each Bridge (small range) x 5 LTR x 5 each Seated clamshell x 15 BTB   PATIENT EDUCATION:  Education details: eval findings, FOTO, HEP, POC Person educated:  Patient Education method: Explanation, Demonstration, and Handouts Education comprehension: verbalized understanding and returned demonstration   HOME EXERCISE PROGRAM: Access Code: [email or web address] URL: [email or web address] Date: 03/01/2022 Prepared by: Octavio Manns   Exercises - Seated Sciatic Tensioner  - 1 x daily - 7 x weekly - 2 sets - 10 reps - Active Straight Leg Raise with Quad Set  - 1 x daily - 7 x weekly - 2 sets - 10 reps - Supine Bridge  - 1 x daily - 7 x weekly - 2 sets - 10 reps - Seated Hip Abduction with Resistance  - 1 x daily - 7 x weekly - 3 sets - 15 reps - Supine Lower Trunk Rotation  - 1 x daily - 7 x weekly - 2 sets - 10 reps   ASSESSMENT:   CLINICAL IMPRESSION: ***   OBJECTIVE IMPAIRMENTS: decreased activity tolerance, decreased balance, decreased endurance, decreased mobility, difficulty walking, decreased ROM, decreased strength, and pain.    ACTIVITY LIMITATIONS: carrying, lifting, bending, standing, squatting, stairs, transfers, and locomotion level   PARTICIPATION LIMITATIONS: meal prep, cleaning, driving, shopping, community activity, occupation, and yard work   PERSONAL FACTORS: Fitness, Time since onset of injury/illness/exacerbation, and 1-2 comorbidities: CKD, Breast cancer, Cardiomyopathy, DM II  are also affecting patient's functional outcome.      GOALS: Goals reviewed with patient? No   SHORT TERM GOALS: Target date: 03/22/2022   Pt will be compliant and knowledgeable with initial HEP for improved comfort and carryover Baseline: initial HEP given  Goal status: INITIAL   2.  Pt  will self report lower back pain no greater than 5/10 for improved comfort and functional ability Baseline: 7/10 at worst Goal status: INITIAL    LONG TERM GOALS: Target date: 04/26/2022   Pt will self report lower back pain no greater than 2/10 for improved comfort and functional ability Baseline: 7/10 at worst Goal status: INITIAL    2.  Pt will improve FOTO function score to no less than 43% as proxy for functional improvement Baseline: 34% function Goal status: INITIAL    3.  Pt will increase 30 Second Sit to Stand rep count to no less than 7 reps (MCID 2) for improved balance, strength, and functional mobility Baseline: 5 reps  Goal status: INITIAL    4.  Pt will increase standing tolerance to no less than 20 minutes without pain for improved functional ability to perform home ADLs Baseline: 10 minutes Goal status: INITIAL   PLAN:   PT FREQUENCY: 2x/week   PT DURATION: 8 weeks   PLANNED INTERVENTIONS: Therapeutic exercises, Therapeutic activity, Neuromuscular re-education, Balance training, Gait training, Patient/Family education, Self Care, Joint mobilization, Aquatic Therapy, Dry Needling, Electrical stimulation, Cryotherapy, Moist heat, Manual therapy, and Re-evaluation.   PLAN FOR NEXT SESSION: assess HEP response, progress core and proximal hip strength, functional activity tolerance   Ward Chatters, PT 03/17/2022, 9:08 AM

## 2022-03-17 NOTE — Patient Instructions (Signed)
Schedule your complete physical for March We'll notify you of your lab results and make any changes if needed Use the Tizanidine as needed for spasm/pain Call and schedule an appt with your diabetes doctor Call with any questions or concerns Stay Safe!  Stay Healthy! Happy Holidays!!!

## 2022-03-17 NOTE — Telephone Encounter (Signed)
-----   Message from [name], MD sent at 03/17/2022  4:13 PM EST ----- A1C is better than your last reading at 8.3%  This is still above goal but better than it was.  Increase your Novolin dose to 35 units twice daily to get better control.

## 2022-03-17 NOTE — Telephone Encounter (Signed)
Called to discuss labs, no answer no VM available

## 2022-03-17 NOTE — Progress Notes (Signed)
   Subjective:    Patient ID: [name], female    DOB: 08-21-1950, 71 y.o.   MRN: [id number]  HPI + PHQ9- pt reports when she first started the Duloxetine 'i didn't feel like myself'.  Stopped taking medication after 4 days.  Pt reports feeling cloudy.  Pt feels that mood is better than last visit.  She is very anxious about the possibility of HD.  Chronic pain- this is unchanged but pt did not take Cymbalta more than 4 days.  DM- chronic problem, on Farxiga '10mg'$  daily, Glipizide '5mg'$  daily, Novolin 70/30 30 units BID.  Eye exam scheduled for next week.  Due for A1C- overdue for Endo appt   Review of Systems For ROS see HPI     Objective:   Physical Exam Vitals reviewed.  Constitutional:      General: She is not in acute distress.    Appearance: Normal appearance. She is not ill-appearing.  HENT:     Head: Normocephalic and atraumatic.  Eyes:     Extraocular Movements: Extraocular movements intact.     Conjunctiva/sclera: Conjunctivae normal.  Cardiovascular:     Rate and Rhythm: Normal rate and regular rhythm.  Pulmonary:     Effort: Pulmonary effort is normal. No respiratory distress.  Skin:    General: Skin is warm and dry.  Neurological:     General: No focal deficit present.     Mental Status: She is alert and oriented to person, place, and time.  Psychiatric:        Mood and Affect: Mood normal.        Behavior: Behavior normal.        Thought Content: Thought content normal.           Assessment & Plan:

## 2022-03-18 NOTE — Telephone Encounter (Signed)
Spoke to pt and advised of lab result and advised her to increase her Novolin dose to 35 units BID . Pt expressed verbal understanding

## 2022-03-20 NOTE — Assessment & Plan Note (Signed)
Pt reports mood has improved.  She is not taking the Cymbalta as it caused her to feel strangely.  She states she has a new outlook on the possibility of dialysis and is coming to a place of acceptance.  This seems to have significantly improved her mood.  Will continue to follow.

## 2022-03-20 NOTE — Assessment & Plan Note (Signed)
Chronic problem.  Overdue for Endo appt.  Will check A1C.  Refill provided on 70/30 insulin.  Eye exam is scheduled next week.  She sees Nephrology regularly.  Will continue to follow along.

## 2022-03-20 NOTE — Assessment & Plan Note (Signed)
Ongoing issue.  Cymbalta was not successful as she stopped after 4 days b/c she felt 'strange'.  She is open to the idea of trying anything to try and feel better.  Will start low dose Tizanidine and monitor for improvement.  Pt expressed understanding and is in agreement w/ plan.

## 2022-03-21 ENCOUNTER — Ambulatory Visit: Payer: Medicare HMO

## 2022-03-21 DIAGNOSIS — R2689 Other abnormalities of gait and mobility: Secondary | ICD-10-CM

## 2022-03-21 DIAGNOSIS — M6281 Muscle weakness (generalized): Secondary | ICD-10-CM | POA: Diagnosis not present

## 2022-03-21 DIAGNOSIS — M5459 Other low back pain: Secondary | ICD-10-CM

## 2022-03-21 NOTE — Therapy (Signed)
OUTPATIENT PHYSICAL THERAPY TREATMENT NOTE   Patient Name: [name] MRN: [id number] DOB:1950-11-15, 71 y.o., female Today's Date: 03/21/2022  PCP: [name], MD  REFERRING PROVIDER: Arne Cleveland. Heagen, PA-C   END OF SESSION:   PT End of Session - 03/21/22 1526     Visit Number 4    Number of Visits 17    Date for PT Re-Evaluation 04/26/22    Authorization Type [employer] MCR    PT Start Time 2376    PT Stop Time 1609    PT Time Calculation (min) 39 min    Activity Tolerance Patient tolerated treatment well    Behavior During Therapy WFL for tasks assessed/performed               Past Medical History:  Diagnosis Date   Anginal pain ([place]) 03/19/2012   saw Dr. Cathie Olden .. she thinks its more related to stomach issues   Arthritis    "all over; mostly in my back" (03/20/2012)   Breast cancer ([place])    "right" (03/20/2012)   Cardiomyopathy    Chest pain 06/2005   Hospitalized, dystolic dysfunction   Chronic lower back pain    "have 2 herniated discs; going to have to have a fusion" (03/20/2012)   CKD (chronic kidney disease), stage III ([place])    Family history of anesthesia complication    "father has nausea and vomiting"   Gallstones    GERD (gastroesophageal reflux disease)    H/O hiatal hernia    History of kidney stones    History of right mastectomy    Hypertension    "patient states never had HTN, takes Hyzaar for heart   Hypothyroidism    Irritable bowel syndrome    Migraines    "it's been a long time since I've had one" (03/20/2012)   Osteoarthritis    Peripheral neuropathy    PONV (postoperative nausea and vomiting)    Sciatica    Type II diabetes mellitus ([place])    Past Surgical History:  Procedure Laterality Date   ABDOMINAL HYSTERECTOMY  1993   adenosin cardiolite  07/2005   (+) wall motion abnormality   AXILLARY LYMPH NODE DISSECTION  2003   squamous cell cancer   BACK SURGERY     CARDIAC CATHETERIZATION  ? 2005 / 2007    CHOLECYSTECTOMY N/A 01/24/2018   Procedure: LAPAROSCOPIC CHOLECYSTECTOMY;  Surgeon: Erroll Luna, MD;  Location: [facility];  Service: General;  Laterality: N/A;   CT abdomen and pelvis  02/2010   Same   ESOPHAGOGASTRODUODENOSCOPY  2005   GERD   KNEE ARTHROSCOPY  1990's   "right" (03/20/2012)   LUMBAR FUSION N/A 08/22/2014   Procedure: Right L2-3 and right L1-2 transforaminal lumbar interbody fusion with pedicle screws, rods, sleeves, cages, local bone graft, Vivigen, cancellous chips;  Surgeon: Jessy Oto, MD;  Location: [facility];  Service: Orthopedics;  Laterality: N/A;   LUMBAR LAMINECTOMY/DECOMPRESSION MICRODISCECTOMY N/A 02/04/2014   Procedure: RIGHT L2-3 MICRODISCECTOMY ;  Surgeon: Jessy Oto, MD;  Location: [facility];  Service: Orthopedics;  Laterality: N/A;   MASTECTOMY Right    MASTECTOMY WITH AXILLARY LYMPH NODE DISSECTION  12/2003   "right" (03/20/2012)   POSTERIOR CERVICAL FUSION/FORAMINOTOMY  2001   SHOULDER ARTHROSCOPY W/ ROTATOR CUFF REPAIR  2003   Left   THYROIDECTOMY  1977   Right   TOTAL KNEE ARTHROPLASTY  2000   Right   [place] of abdomen  07/2005   Fatty liver / kidney stones  Patient Active Problem List   Diagnosis Date Noted   Positive screening for depression on 9-item Patient Health Questionnaire (PHQ-9) 03/07/2022   Hammer toe of second toe of left foot 05/05/2021   Corns and callosities 05/05/2021   Hav (hallux abducto valgus), unspecified laterality 05/05/2021   Hyperlipidemia associated with type 2 diabetes mellitus ([place]) 02/01/2021   Depression 02/01/2021   Localized osteoporosis without current pathological fracture 05/01/2020   Hyperparathyroidism ([place]) 12/13/2019   Elevated hemoglobin ([facility]) 12/13/2019   Chronic renal impairment, stage 4 (severe) ([place]) 10/28/2019   Anemia 10/28/2019   Physical exam 06/26/2019   Neck pain 04/18/2019   Ascending aortic aneurysm ([place]) 04/17/2019   Hypoxia    Nonspecific chest pain 04/15/2019   Neuropathic pain of right  flank 12/27/2018   Dysphagia 12/27/2018   Postsurgical hypothyroidism 06/11/2018   Vitamin D deficiency 06/11/2018   Multinodular goiter 06/11/2018   Hypercalcemia 06/11/2018   DM type 2 causing vascular disease ([facility])    PONV (postoperative nausea and vomiting)    Peripheral neuropathy    Migraine    Irritable bowel syndrome    Hypertension    History of right mastectomy    H/O hiatal hernia    GERD (gastroesophageal reflux disease)    Family history of anesthesia complication    Other chronic pain    Type 2 diabetes mellitus with diabetic chronic kidney disease ([place])    Lower extremity edema    Chronic constipation 06/23/2015   Chronic diastolic heart failure ([place]) 11/17/2014   Herniated nucleus pulposus, L2-3 right 02/04/2014    Class: Acute   Spondylolisthesis of lumbar region 04/30/2012    Class: Chronic   Lumbar stenosis with neurogenic claudication 04/30/2012    Class: Chronic   SYNCOPE-CAROTID SINUS 09/16/2008   OSTEOARTHRITIS 08/23/2006   DDD (degenerative disc disease), cervical 08/23/2006   DISC DISEASE, LUMBAR 08/23/2006   Headache(784.0) 08/23/2006   BREAST CANCER, HX OF 08/23/2006   HYPERPARATHYROIDISM, HX OF 08/23/2006   RENAL CALCULUS, HX OF 08/23/2006    REFERRING DIAG: OTHER spondylosis with myelopathy ,lumbar region    THERAPY DIAG:  Other low back pain  Muscle weakness (generalized)  Other abnormalities of gait and mobility  Rationale for Evaluation and Treatment Rehabilitation  PERTINENT HISTORY: Hx of lumbar fusions, CKD, Breast cancer, Cardiomyopathy, DM II, R TKA   PRECAUTIONS: None   SUBJECTIVE:                                                                                                                                                                                      SUBJECTIVE STATEMENT:  Pt presents to PT with reports of continued lower back  pain. Has been compliant with HEP. Ready to begin PT at this time.    PAIN:  Are you  having pain?  Yes: NPRS scale: 9/10 Worst: 7/10 Pain location: lower back, R LE Pain description: dull ache, N/T Aggravating factors: prolonged standing, walking Relieving factors: rest   OBJECTIVE: (objective measures completed at initial evaluation unless otherwise dated)  DIAGNOSTIC FINDINGS:  See imaging   PATIENT SURVEYS:  FOTO: 34% function; 43% predicted   COGNITION: Overall cognitive status: Within functional limits for tasks assessed               SENSATION: Light touch: Impaired - decreased light touch R LE   POSTURE: rounded shoulders, forward head, and increased lumbar lordosis   PALPATION: TTP to R posterior hip musculature    LUMBAR ROM:    AROM eval  Flexion 50% reduced  Extension 50% reduced  Right lateral flexion    Left lateral flexion    Right rotation    Left rotation     (Blank rows = not tested)   LOWER EXTREMITY MMT:     MMT Right eval Left eval  Hip flexion 3/5 3+/5  Hip extension      Hip abduction 3/5 3+/5  Hip adduction 3/5 3+/5  Hip internal rotation      Hip external rotation      Knee flexion 3+/5 4/5  Knee extension 3+/5 4/5  Ankle dorsiflexion      Ankle plantarflexion      Ankle inversion      Ankle eversion       (Blank rows = not tested)   LUMBAR SPECIAL TESTS:  Straight leg raise test: Positive and Slump test: Positive   FUNCTIONAL TESTS:  30 Second Sit to Stand: 5 reps   GAIT: Distance walked: 69f Assistive device utilized: Single point cane Level of assistance: Modified independence Comments: slowed, antalgic gait on R LE   TREATMENT: [facility] Adult PT Treatment:                                                DATE: 03/21/2022 Therapeutic Exercise: NuStep lvl 5 UE/LE x 4 min while taking subjective  Seated sciatic nerve glide x 10 each Seated clamshell 2x15 GTB LTR x 10 each Supine SLR 2x10 each Seated ball squeeze 2x10 - 3" hold Seated clamshell 2x15 BTB LAQ 2x10 2.5# Seated hamstring curl 2x10  GTB Standing heel raises x 10 Standing hip abd x 10 each  [facility] Adult PT Treatment:                                                DATE: 03/15/2022 Therapeutic Exercise: NuStep lvl 5 UE/LE x 4 min while taking subjective  Seated sciatic nerve glide x 10 each LTR x 10 each Supine SLR x 10 each Seated ball squeeze 2x10 - 3" hold Seated clamshell 2x15 BTB Seated march 2x20 BTB LAQ 2x10 2#  [facility] Adult PT Treatment:  DATE: 03/09/2022 Therapeutic Exercise: NuStep lvl 5 UE/LE x 4 min while taking subjective  Seated sciatic nerve glide x 10 each LTR x 10 each Supine SLR x 5 each Bridge (small range) x 5 LTR x 5 each Seated clamshell x 15 BTB  [facility] Adult PT Treatment:                                                DATE: 03/01/2022 Therapeutic Exercise: Seated sciatic nerve glide x 5 R Supine SLR x 5 each Bridge (small range) x 5 LTR x 5 each Seated clamshell x 15 BTB   PATIENT EDUCATION:  Education details: eval findings, FOTO, HEP, POC Person educated: Patient Education method: Explanation, Demonstration, and Handouts Education comprehension: verbalized understanding and returned demonstration   HOME EXERCISE PROGRAM: Access Code: [email or web address] URL: [email or web address] Date: 03/01/2022 Prepared by: Octavio Manns   Exercises - Seated Sciatic Tensioner  - 1 x daily - 7 x weekly - 2 sets - 10 reps - Active Straight Leg Raise with Quad Set  - 1 x daily - 7 x weekly - 2 sets - 10 reps - Supine Bridge  - 1 x daily - 7 x weekly - 2 sets - 10 reps - Seated Hip Abduction with Resistance  - 1 x daily - 7 x weekly - 3 sets - 15 reps - Supine Lower Trunk Rotation  - 1 x daily - 7 x weekly - 2 sets - 10 reps   ASSESSMENT:   CLINICAL IMPRESSION: Pt was able to complete prescribed exercises with no adverse effect and tolerated treatment better today. Therapy focused on improving LE strength in order to decrease pain and improve  functional mobility. PT will continue to progress pt as tolerated per POC.    OBJECTIVE IMPAIRMENTS: decreased activity tolerance, decreased balance, decreased endurance, decreased mobility, difficulty walking, decreased ROM, decreased strength, and pain.    ACTIVITY LIMITATIONS: carrying, lifting, bending, standing, squatting, stairs, transfers, and locomotion level   PARTICIPATION LIMITATIONS: meal prep, cleaning, driving, shopping, community activity, occupation, and yard work   PERSONAL FACTORS: Fitness, Time since onset of injury/illness/exacerbation, and 1-2 comorbidities: CKD, Breast cancer, Cardiomyopathy, DM II  are also affecting patient's functional outcome.      GOALS: Goals reviewed with patient? No   SHORT TERM GOALS: Target date: 03/22/2022   Pt will be compliant and knowledgeable with initial HEP for improved comfort and carryover Baseline: initial HEP given  Goal status: INITIAL   2.  Pt will self report lower back pain no greater than 5/10 for improved comfort and functional ability Baseline: 7/10 at worst Goal status: INITIAL    LONG TERM GOALS: Target date: 04/26/2022   Pt will self report lower back pain no greater than 2/10 for improved comfort and functional ability Baseline: 7/10 at worst Goal status: INITIAL    2.  Pt will improve FOTO function score to no less than 43% as proxy for functional improvement Baseline: 34% function Goal status: INITIAL    3.  Pt will increase 30 Second Sit to Stand rep count to no less than 7 reps (MCID 2) for improved balance, strength, and functional mobility Baseline: 5 reps  Goal status: INITIAL    4.  Pt will increase standing tolerance to no less than 20 minutes without pain for improved functional ability to perform home ADLs Baseline: 10  minutes Goal status: INITIAL   PLAN:   PT FREQUENCY: 2x/week   PT DURATION: 8 weeks   PLANNED INTERVENTIONS: Therapeutic exercises, Therapeutic activity, Neuromuscular  re-education, Balance training, Gait training, Patient/Family education, Self Care, Joint mobilization, Aquatic Therapy, Dry Needling, Electrical stimulation, Cryotherapy, Moist heat, Manual therapy, and Re-evaluation.   PLAN FOR NEXT SESSION: assess HEP response, progress core and proximal hip strength, functional activity tolerance   Ward Chatters, PT 03/21/2022, 4:12 PM

## 2022-03-22 ENCOUNTER — Ambulatory Visit: Payer: Medicare HMO

## 2022-03-22 NOTE — Progress Notes (Signed)
Chronic Care Management Pharmacy Note Summary: PharmD FU.  A1c is 8.3%.  She increased her mix insulin to 35 units BID.  Reports sugars are in the 160 range.  Counseled a lot on diet and exercise today.  Hopefully that will get her closer to goal.  If not she may need a basal/bolus regimen to keep her under tighter control.  Looking at her statin looks like last GFR was 16 in July.  Crestor should be 66m or we could switch to Atorvastatin which does not need to be renally adjusted.  Recommend - switch to Atorvastatin 469m patient also requests rx for protonix to mail order  FU 3 months with me CMA to check on Glucose in 2 weeks   03/31/2022 Name:  BaRAHCEL SHUTESRN:  [id number]:  09/1950/07/29Subjective: Desiree GULLATTs an 89.o. year old female who is a primary patient of Tabori, KaAundra MilletMD.  The CCM team was consulted for assistance with disease management and care coordination needs.    Engaged with patient by telephone for follow up visit in response to provider referral for pharmacy case management and/or care coordination services.   Consent to Services:  The patient was given information about Chronic Care Management services, agreed to services, and gave verbal consent prior to initiation of services.  Please see initial visit note for detailed documentation.   Patient Care Team: TaMidge MiniumMD as PCP - General (Family Medicine) CaConstance HawMD as PCP - Cardiology (Cardiology) NiCassandria AngerMD as Consulting Physician (Endocrinology) WaTrula SladeDPM as Consulting Physician (Podiatry) YoDeneise LeverMD as Consulting Physician (Pulmonary Disease) GlLonia SkinnerMD as Consulting Physician (Ophthalmology) FoClaudia DesanctisMD as Consulting Physician (Nephrology) DaEdythe ClarityRPWinston Medical CetnerPharmacist)  Objective:  Lab Results  Component Value Date   CREATININE 2.98 (H) 10/13/2021   CREATININE 2.37 (H) 06/20/2021    CREATININE 1.43 (H) 02/05/2021    Lab Results  Component Value Date   HGBA1C 8.3 (H) 03/17/2022   Last diabetic Eye exam:  Lab Results  Component Value Date/Time   HMDIABEYEEXA Retinopathy (A) 03/17/2021 12:00 AM    Last diabetic Foot exam:  Lab Results  Component Value Date/Time   HMDIABFOOTEX yes 09/21/2009 12:00 AM        Component Value Date/Time   CHOL 162 02/05/2021 0947   TRIG 210 (H) 02/05/2021 0947   HDL 51 02/05/2021 0947   CHOLHDL 3.2 06/02/2020 0816   VLDL 42 (H) 04/17/2019 0227   LDLCALC 76 02/05/2021 0947   LDLCALC 60 06/02/2020 0816       Latest Ref Rng & Units 10/13/2021    3:22 PM 02/05/2021    9:47 AM 07/06/2020    4:49 PM  Hepatic Function  Total Protein 6.0 - 8.5 g/dL 7.5  6.7  8.0   Albumin 3.7 - 4.7 g/dL 4.2  3.9  4.0   AST 0 - 40 IU/L _0 ALT 0 - 32 IU/L _1 Alk Phosphatase 44 - 121 IU/L 67  58  55   Total Bilirubin 0.0 - 1.2 mg/dL 0.2  0.4  0.6   Bilirubin, Direct 0.00 - 0.40 mg/dL  0.14      Lab Results  Component Value Date/Time   TSH 0.692 10/13/2021 03:22 PM   TSH 0.417 (L) 10/23/2020 01:32 PM   FREET4 1.36 10/13/2021 03:22 PM  FREET4 1.29 10/23/2020 01:32 PM       Latest Ref Rng & Units 06/20/2021   12:13 AM 02/05/2021    9:47 AM 07/06/2020    4:49 PM  CBC  WBC 4.0 - 10.5 K/uL 8.8  6.7  8.9   Hemoglobin 12.0 - 15.0 g/dL 11.2  13.5  12.0   Hematocrit 36.0 - 46.0 % 36.1  42.6  37.9   Platelets 150 - 400 K/uL 328  288  291     Lab Results  Component Value Date/Time   VD25OH 15 (L) 06/02/2020 08:16 AM   VD25OH 17 (L) 10/02/2018 11:59 AM   Clinical ASCVD:  The 10-year ASCVD risk score (Arnett DK, et al., 2019) is: 23.9%   Values used to calculate the score:     Age: 34 years     Sex: Female     Is Non-Hispanic African American: Yes     Diabetic: Yes     Tobacco smoker: No     Systolic Blood Pressure: 993 mmHg     Is BP treated: Yes     HDL Cholesterol: 51 mg/dL     Total Cholesterol: 162 mg/dL      Social History   Tobacco Use  Smoking Status Never  Smokeless Tobacco Never   BP Readings from Last 3 Encounters:  03/17/22 124/64  02/17/22 118/68  01/06/22 122/60   Pulse Readings from Last 3 Encounters:  03/17/22 (!) 44  02/17/22 (!) 55  01/06/22 (!) 54   Wt Readings from Last 3 Encounters:  03/17/22 185 lb 4 oz (84 kg)  02/17/22 190 lb 8 oz (86.4 kg)  01/06/22 191 lb 12.8 oz (87 kg)    Assessment: Review of patient past medical history, allergies, medications, health status, including review of consultants reports, laboratory and other test data, was performed as part of comprehensive evaluation and provision of chronic care management services.   SDOH:  (Social Determinants of Health) assessments and interventions performed: No, done within year Financial Resource Strain: Medium Risk (12/23/2021)   Overall Financial Resource Strain (CARDIA)    Difficulty of Paying Living Expenses: Somewhat hard    SDOH Interventions    Flowsheet Row Office Visit from 02/17/2022 in [place] [facility] from 12/23/2021 in [place] Patient Outreach Telephone from 11/17/2021 in [place] Patient Outreach Telephone from 09/10/2020 in [place] [facility] from 12/19/2019 in [place] Patient Outreach Telephone from 10/29/2019 in [place] Interventions        Food Insecurity Interventions -- Intervention Not Indicated Intervention Not Indicated Intervention Not Indicated Other (Comment) --  Housing Interventions -- Intervention Not Indicated Intervention Not Indicated -- -- Intervention Not Indicated  Transportation Interventions -- Intervention Not Indicated Intervention Not Indicated Intervention Not Indicated Other (Comment) Intervention Not Indicated  Alcohol Usage  Interventions -- Intervention Not Indicated (Score <7) -- -- -- --  Depression Interventions/Treatment  Medication -- -- -- -- --  Financial Strain Interventions -- [id number] Referral -- -- -- --  Physical Activity Interventions -- Intervention Not Indicated -- -- -- --  Stress Interventions -- Intervention Not Indicated -- -- -- --  Social Connections Interventions -- Intervention Not Indicated -- -- -- --       CCM Care Plan  Allergies  Allergen Reactions   Dilaudid [Hydromorphone Hcl] Nausea And Vomiting   Hydromorphone Nausea And Vomiting   Lipitor [Atorvastatin] Other (  See Comments)    Body & Muscle Aches   Adhesive [Tape] Rash   Ampicillin Rash and Other (See Comments)    Also developed welts on her hands when she took it in the hospital   Latex Itching, Dermatitis, Rash and Other (See Comments)    Patient had a reaction to tape after surgery and they told her she was allergic to Latex    Penicillins Rash and Other (See Comments)    Has patient had a PCN reaction causing immediate rash, facial/tongue/throat swelling, SOB or lightheadedness with hypotension: No Has patient had a PCN reaction causing severe rash involving mucus membranes or skin necrosis: No Has patient had a PCN reaction that required hospitalization: Yes - in hospital receiving treatment Has patient had a PCN reaction occurring within the last 10 years: No If all of the above answers are "NO", then may proceed with Cephalosporin use.     Medications Reviewed Today     Reviewed by Edythe Clarity, [facility] (Pharmacist) on 03/31/22 at 1211  Med List Status: <None>   Medication Order Taking? Sig Documenting Provider Last Dose Status Informant  acetaminophen (TYLENOL) 500 MG tablet [id number] Yes Take 500-1,000 mg by mouth every 6 (six) hours as needed for moderate pain, headache or mild pain. [provider] Taking Active Self  Alcohol Swabs (B-D SINGLE USE SWABS REGULAR) PADS [id number] Yes Use as  directed. Dutch Quint B, FNP Taking Active   amLODipine (NORVASC) 10 MG tablet [id number] Yes Take 1 tablet (10 mg total) by mouth daily. Dutch Quint B, FNP Taking Active   aspirin 81 MG tablet [id number] Yes Take 81 mg by mouth daily. [provider] Taking Active Self  Blood Glucose Monitoring Suppl (TRUE METRIX AIR GLUCOSE METER) w/Device KIT [id number] Yes 1 kit by Does not apply route 3 (three) times daily. [name], MD Taking Active Self  carvedilol (COREG) 6.25 MG tablet [id number] Yes Take 0.5 tablets (3.125 mg total) by mouth 2 (two) times daily with a meal. [name], MD Taking Active   dapagliflozin propanediol (FARXIGA) 10 MG TABS tablet [id number] Yes Take by mouth daily. [provider] Taking Active   famotidine (PEPCID) 40 MG tablet [id number] Yes TAKE 1 TABLET BY MOUTH EVERY DAY [name], MD Taking Active   glipiZIDE (GLUCOTROL XL) 5 MG 24 hr tablet [id number] No Take 1 tablet by mouth daily.  Patient not taking: Reported on 03/30/2022   [provider] Not Taking Active   glucose blood (TRUE METRIX BLOOD GLUCOSE TEST) test strip [id number] Yes Use as instructed Kennyth Arnold, FNP Taking Active   hydrALAZINE (APRESOLINE) 50 MG tablet [id number] Yes TAKE 1 TABLET BY MOUTH TWICE A DAY [name], MD Taking Active   insulin NPH-regular Human (NOVOLIN 70/30) (70-30) 100 UNIT/ML injection [id number] Yes Inject 30 Units into the skin 2 (two) times daily with a meal. [name], MD Taking Active   levothyroxine (SYNTHROID) 100 MCG tablet [id number] Yes Take 1 tablet (100 mcg total) by mouth daily. Dutch Quint B, FNP Taking Active   levothyroxine (SYNTHROID) 100 MCG tablet [id number] Yes Take 1 tablet (100 mcg total) by mouth daily. [name], MD Taking Active   losartan (COZAAR) 25 MG tablet [id number] No Take 1 tablet (25 mg total) by mouth daily.  Patient not taking: Reported on 03/30/2022   Kennyth Arnold,  FNP Not Taking Active   metoCLOPramide (REGLAN) 10 MG tablet [id number] Yes TAKE 1 TABLET FOUR  TIMES DAILY [name], MD Taking Active   ondansetron [facility]) 4 MG tablet [id number] Yes Take 1 tablet (4 mg total) by mouth every 8 (eight) hours as needed for nausea or vomiting. [name], MD Taking Active   pantoprazole (PROTONIX) 40 MG tablet [id number] Yes Take 1 tablet (40 mg total) by mouth 2 (two) times daily. Dutch Quint B, FNP Taking Active   polyethylene glycol Merril Abbe / GLYCOLAX) packet [id number] Yes Take 17 g by mouth daily as needed for moderate constipation (MIX AND DRINK). [provider] Taking Active Self  rosuvastatin (CRESTOR) 20 MG tablet [id number] Yes TAKE 1 TABLET AT BEDTIME [name], MD Taking Active   sodium bicarbonate 650 MG tablet [id number] Yes Take 650 mg by mouth 2 (two) times daily. [provider] Taking Active   SUMAtriptan (IMITREX) 50 MG tablet [id number] Yes TAKE 1 TAB EVERY 2 HRS AS NEEDED FOR MIGRAINE. MAY REPEAT IN 2 HOURS IF HEADACHE PERSISTS OR RECURS. [name], MD Taking Active   tizanidine (ZANAFLEX) 2 MG capsule [id number] Yes Take 1 capsule (2 mg total) by mouth 3 (three) times daily as needed for muscle spasms. [name], MD Taking Active   torsemide [facility]) 20 MG tablet [id number] Yes Take 1 tablet (20 mg total) by mouth 2 (two) times daily. Kennyth Arnold, FNP Taking Active   TRUEplus Lancets 33G MISC [id number] Yes Use as directed Kennyth Arnold, FNP Taking Active             Patient Active Problem List   Diagnosis Date Noted   Positive screening for depression on 9-item Patient Health Questionnaire (PHQ-9) 03/07/2022   Hammer toe of second toe of left foot 05/05/2021   Corns and callosities 05/05/2021   Hav (hallux abducto valgus), unspecified laterality 05/05/2021   Hyperlipidemia associated with type 2 diabetes mellitus ([place]) 02/01/2021   Depression 02/01/2021   Localized  osteoporosis without current pathological fracture 05/01/2020   Hyperparathyroidism ([place]) 12/13/2019   Elevated hemoglobin ([place]) 12/13/2019   Chronic renal impairment, stage 4 (severe) ([place]) 10/28/2019   Anemia 10/28/2019   Physical exam 06/26/2019   Neck pain 04/18/2019   Ascending aortic aneurysm ([place]) 04/17/2019   Hypoxia    Nonspecific chest pain 04/15/2019   Neuropathic pain of right flank 12/27/2018   Dysphagia 12/27/2018   Postsurgical hypothyroidism 06/11/2018   Vitamin D deficiency 06/11/2018   Multinodular goiter 06/11/2018   Hypercalcemia 06/11/2018   DM type 2 causing vascular disease (HCC)    PONV (postoperative nausea and vomiting)    Peripheral neuropathy    Migraine    Irritable bowel syndrome    Hypertension    History of right mastectomy    H/O hiatal hernia    GERD (gastroesophageal reflux disease)    Family history of anesthesia complication    Other chronic pain    Type 2 diabetes mellitus with diabetic chronic kidney disease ([place])    Lower extremity edema    Chronic constipation 06/23/2015   Chronic diastolic heart failure ([place]) 11/17/2014   Herniated nucleus pulposus, L2-3 right 02/04/2014    Class: Acute   Spondylolisthesis of lumbar region 04/30/2012    Class: Chronic   Lumbar stenosis with neurogenic claudication 04/30/2012    Class: Chronic   SYNCOPE-CAROTID SINUS 09/16/2008   OSTEOARTHRITIS 08/23/2006   DDD (degenerative disc disease), cervical 08/23/2006   DISC DISEASE, LUMBAR 08/23/2006   Headache(784.0) 08/23/2006   BREAST CANCER, HX OF 08/23/2006   HYPERPARATHYROIDISM, HX OF  08/23/2006   RENAL CALCULUS, HX OF 08/23/2006    Immunization History  Administered Date(s) Administered   Fluad Quad(high Dose 65+) 12/13/2019, 02/01/2021, 01/06/2022   Influenza Split 01/31/2011   Influenza Whole 02/23/2007   Influenza, High Dose Seasonal PF 01/10/2017   Influenza,inj,Quad PF,6+ Mos 01/07/2014, 02/09/2016, 01/01/2018, 12/03/2018    Influenza-Unspecified 01/10/2012   PFIZER Comirnaty(Gray Top)Covid-19 Tri-Sucrose Vaccine 05/24/2019, 06/18/2019, 01/11/2020, 08/15/2020   PFIZER(Purple Top)SARS-COV-2 Vaccination 05/24/2019, 06/18/2019, 01/11/2020   Pneumococcal Conjugate-13 05/16/2019   Pneumococcal Polysaccharide-23 08/25/2014   Tdap 04/20/2011    Conditions to be addressed/monitored: Hypertension, Hyperlipidemia, Diabetes, GERD, COPD, Chronic Kidney Disease and Osteoporosis  Care Plan : General Pharmacy (Adult)  Updates made by Edythe Clarity, RPH since 03/31/2022 12:00 AM     Problem: Hypertension, Hyperlipidemia, Diabetes, GERD, COPD, Chronic Kidney Disease and Osteoporosis   Priority: High  Onset Date: 06/10/2021     Long-Range Goal: Patient-Specific Goal   Start Date: 06/10/2021  Expected End Date: 12/11/2021  Recent Progress: On track  Priority: High  Note:   Pharmacist Clinical Goal(s):  Patient will achieve adherence to monitoring guidelines and medication adherence to achieve therapeutic efficacy contact provider office for questions/concerns as evidenced notation of same in electronic health record through collaboration with PharmD and provider.   Interventions: 1:1 collaboration with [name], MD regarding development and update of comprehensive plan of care as evidenced by provider attestation and co-signature Inter-disciplinary care team collaboration (see longitudinal plan of care) Comprehensive medication review performed; medication list updated in electronic medical record  Diabetes (A1c goal <7%) 03/30/22 -Uncontrolled, last A1c 8.3% -Current medications: Novolin 70/30 35 units BID with a meal Appropriate, Query Effective Farxiga 22m Appropriate, Query effective, -Medications previously tried: Glyxambi, Toujeo, glipizide -Current home glucose readings fasting glucose: 160s  post prandial glucose:  -Denies hypoglycemic/hyperglycemic symptoms -Current exercise: minimal, back  pain is really holding her back -Educated on A1c and blood sugar goals; Prevention and management of hypoglycemic episodes; Benefits of routine self-monitoring of blood sugar; Recent A1c of 8.3% - her insulin was increased to 35 units twice daily reports fasting glucose is in the 160 range. Not having any hypoglycemia.  Discussion on dietary choices today.  Will wait and see how she does with increased insulin.  Consider basal/bolus regimen if she cannot control with this.  Limited with GLP-1 due to gastroparesis. Metformin limited due to CKD. FU next month on glucose readings.  Hypertension (BP goal <130/80) -Controlled, based on office BP - not assessed -Current treatment: Hydralazine 50 mg three times daily Appropriate, Effective, safe, accessible Torsemide 20 mg BID Appropriate, Effective, safe, accessible Amlodipine 16mdailyAppropriate, Effective, safe, accessible Carvedilol 6.2643mwice daily AAppropriate, Effective, safe, accessible -Current home readings: unsure of what it has been lately -Current exercise habits: no formal exercise - limited due to chronic pain - planing on getting spinal cord stimulator  -Reports some dizziness - have asked her to check her BP when she feels this. No changes to meds at this time, pending home BP monitoring will assess for hypotension with reported dizziness. No changes at this time - CMA to FU in 2 weeks on home BP.   Update 06/09/21 Patient reports she has not been taking any of her oral medication on regular basis.  She reports a BP of systolic 200032Ze time, unclear how accurate this was.  She denies any dizziness or HA.  We spent most of the visit talking about importance of adherence.  Patient agrees to start taking her  medications more regularly and agrees to me having someone check in with her regularly to see if she is taking her medications. She gets mail order which is preferred so [facility] services were not an option. We cannot adjust BP  meds if she is not taking what she is prescribed. Will have CMA call patient next week to ensure she is back on track with medications.  Osteoporosis (goal prevent fractures) -Controlled, not assessed -Reports to be using consistently without issue  -Current regimen: None -Reviewed side effects - no problems noted  -Last DEXA 03/2020 - osteoporosis  Hyperlipidemia: (LDL goal < 70) 03/31/22 -Controlled, due for repeat lipid panel -Several cardiac risk factors  -Current treatment: Rosuvastatin 20 mg once daily (90 DS sent 4/22) Appropriate, Effective, Safe, Accessible -Reviewed adherence with patient - reports consistent use. Denies any recent side effects of problems -Educated on Cholesterol goals;  Benefits of statin for ASCVD risk reduction; -Recommended to continue current medication  -Patient is due for an updated lipid panel. -Current ASCVD risk is 23.9% - recommend high intensity statin which she is on.  However, most recent GFR is 16 back in July.  Max dose of Crestor for this GFR is 70m.  Would recommend switch to Atorvastatin 451mto prevent kidney damage. Will consult with Dr. TaBirdie Riddlen this. FU on statin switch in 30 days. Need labs in 6 weeks.  GERD (Goal: minimize symptoms, optimize medication safety) -Controlled, not assessed -Daily ASA -Some ongoing symptoms but feels controlled well overall. -Several questions about metoclopramide and pantoprazole - reviewed at length with patient, all questions answered. -Current treatment: Pantoprazole 40 mg twice daily - 1 tablet twice daily  Metoclopramide 10 mg four times daily  -Dietary triggers reviewed at length  -No problems with current regimen per patient. No side effects noted -Recommended continue current medications   Patient Goals/Self-Care Activities Patient will:  - target a minimum of 150 minutes of moderate intensity exercise weekly engage in dietary modifications by minimizing snacking   Follow Up Plan:  Telephone follow up appointment with care management team member scheduled for: Medication Assistance: None required.  Patient affirms current coverage meets needs.          Compliance/Adherence/Medication fill history: Care Gaps: A1c, colonoscopy  Star-Rating Drugs: Rosuvastatin 2079m2/07/23 90ds  Patient's preferred pharmacy is:  [id number] [address] [id number]: [phone]: [phone] Delivery - [place]: [phone]: [phone] Up:  Patient agrees to Care Plan and Follow-up.  Plan: Regular follow up to promote adherence from CMA [facility] Appointments  Date Time Provider DepaHampton/21/2023  2:45 PM StroBarkley BoardsCKaiser Fnd Hosp [facility]remontC[facility]2/2024  2:45 PM StroBarkley BoardsCRml [facility]4/2024  2:45 PM StroBarkley BoardsCBanner [facility] Samaritan Medical CenterCEn[facility]9/2024  2:45 PM WillMargarette CanadaA [facility]11/2024  2:45 PM StroBarkley BoardsCEastern [facility] Regional SurgeryCVa [facility]16/2024  2:45 PM WillMargarette CanadaA OPRCA[facility] CenterCC[facility]18/2024  2:45 PM StroBarkley BoardsCPalos Hills Surgery CenterCP[facility]5/2024 11:00 AM TaboMidge Minium [facility]  01/05/2023  3:00 PM [facility] HEALTH COACH [facility] MarengoarmD Clinical Pharmacist  LebaComanche [facility] Medical [phone]

## 2022-03-23 DIAGNOSIS — H16223 Keratoconjunctivitis sicca, not specified as Sjogren's, bilateral: Secondary | ICD-10-CM | POA: Diagnosis not present

## 2022-03-23 DIAGNOSIS — H43822 Vitreomacular adhesion, left eye: Secondary | ICD-10-CM | POA: Diagnosis not present

## 2022-03-23 DIAGNOSIS — H35372 Puckering of macula, left eye: Secondary | ICD-10-CM | POA: Diagnosis not present

## 2022-03-23 DIAGNOSIS — E113393 Type 2 diabetes mellitus with moderate nonproliferative diabetic retinopathy without macular edema, bilateral: Secondary | ICD-10-CM | POA: Diagnosis not present

## 2022-03-23 LAB — HM DIABETES EYE EXAM

## 2022-03-24 ENCOUNTER — Ambulatory Visit: Payer: Medicare HMO

## 2022-03-24 DIAGNOSIS — R2689 Other abnormalities of gait and mobility: Secondary | ICD-10-CM | POA: Diagnosis not present

## 2022-03-24 DIAGNOSIS — M6281 Muscle weakness (generalized): Secondary | ICD-10-CM | POA: Diagnosis not present

## 2022-03-24 DIAGNOSIS — M5459 Other low back pain: Secondary | ICD-10-CM | POA: Diagnosis not present

## 2022-03-24 NOTE — Therapy (Signed)
OUTPATIENT PHYSICAL THERAPY TREATMENT NOTE   Patient Name: [name] MRN: [id number] DOB:[birth year], 71 y.o., female Today's Date: 03/24/2022  PCP: [name], MD  REFERRING PROVIDER: Arne Cleveland. Heagen, PA-C   END OF SESSION:   PT End of Session - 03/24/22 1527     Visit Number 5    Number of Visits 17    Date for PT Re-Evaluation 04/26/22    Authorization Type [employer] MCR    PT Start Time 1530    PT Stop Time 1610    PT Time Calculation (min) 40 min    Activity Tolerance Patient tolerated treatment well    Behavior During Therapy WFL for tasks assessed/performed                Past Medical History:  Diagnosis Date   Anginal pain ([place]) 03/19/2012   saw Dr. Cathie Olden .. she thinks its more related to stomach issues   Arthritis    "all over; mostly in my back" (03/20/2012)   Breast cancer ([place])    "right" (03/20/2012)   Cardiomyopathy    Chest pain 06/2005   Hospitalized, dystolic dysfunction   Chronic lower back pain    "have 2 herniated discs; going to have to have a fusion" (03/20/2012)   CKD (chronic kidney disease), stage III ([place])    Family history of anesthesia complication    "father has nausea and vomiting"   Gallstones    GERD (gastroesophageal reflux disease)    H/O hiatal hernia    History of kidney stones    History of right mastectomy    Hypertension    "patient states never had HTN, takes Hyzaar for heart   Hypothyroidism    Irritable bowel syndrome    Migraines    "it's been a long time since I've had one" (03/20/2012)   Osteoarthritis    Peripheral neuropathy    PONV (postoperative nausea and vomiting)    Sciatica    Type II diabetes mellitus ([place])    Past Surgical History:  Procedure Laterality Date   ABDOMINAL HYSTERECTOMY  1993   adenosin cardiolite  07/2005   (+) wall motion abnormality   AXILLARY LYMPH NODE DISSECTION  2003   squamous cell cancer   BACK SURGERY     CARDIAC CATHETERIZATION  ? 2005 / 2007    CHOLECYSTECTOMY N/A 01/24/2018   Procedure: LAPAROSCOPIC CHOLECYSTECTOMY;  Surgeon: Erroll Luna, MD;  Location: [facility];  Service: General;  Laterality: N/A;   CT abdomen and pelvis  02/2010   Same   ESOPHAGOGASTRODUODENOSCOPY  2005   GERD   KNEE ARTHROSCOPY  1990's   "right" (03/20/2012)   LUMBAR FUSION N/A 08/22/2014   Procedure: Right L2-3 and right L1-2 transforaminal lumbar interbody fusion with pedicle screws, rods, sleeves, cages, local bone graft, Vivigen, cancellous chips;  Surgeon: Jessy Oto, MD;  Location: [facility];  Service: Orthopedics;  Laterality: N/A;   LUMBAR LAMINECTOMY/DECOMPRESSION MICRODISCECTOMY N/A 02/04/2014   Procedure: RIGHT L2-3 MICRODISCECTOMY ;  Surgeon: Jessy Oto, MD;  Location: [facility];  Service: Orthopedics;  Laterality: N/A;   MASTECTOMY Right    MASTECTOMY WITH AXILLARY LYMPH NODE DISSECTION  12/2003   "right" (03/20/2012)   POSTERIOR CERVICAL FUSION/FORAMINOTOMY  2001   SHOULDER ARTHROSCOPY W/ ROTATOR CUFF REPAIR  2003   Left   THYROIDECTOMY  1977   Right   TOTAL KNEE ARTHROPLASTY  2000   Right   [place] of abdomen  07/2005   Fatty liver / kidney  stones   Patient Active Problem List   Diagnosis Date Noted   Positive screening for depression on 9-item Patient Health Questionnaire (PHQ-9) 03/07/2022   Hammer toe of second toe of left foot 05/05/2021   Corns and callosities 05/05/2021   Hav (hallux abducto valgus), unspecified laterality 05/05/2021   Hyperlipidemia associated with type 2 diabetes mellitus ([place]) 02/01/2021   Depression 02/01/2021   Localized osteoporosis without current pathological fracture 05/01/2020   Hyperparathyroidism ([place]) 12/13/2019   Elevated hemoglobin ([place]) 12/13/2019   Chronic renal impairment, stage 4 (severe) ([place]) 10/28/2019   Anemia 10/28/2019   Physical exam 06/26/2019   Neck pain 04/18/2019   Ascending aortic aneurysm ([place]) 04/17/2019   Hypoxia    Nonspecific chest pain 04/15/2019   Neuropathic pain of right  flank 12/27/2018   Dysphagia 12/27/2018   Postsurgical hypothyroidism 06/11/2018   Vitamin D deficiency 06/11/2018   Multinodular goiter 06/11/2018   Hypercalcemia 06/11/2018   DM type 2 causing vascular disease ([facility])    PONV (postoperative nausea and vomiting)    Peripheral neuropathy    Migraine    Irritable bowel syndrome    Hypertension    History of right mastectomy    H/O hiatal hernia    GERD (gastroesophageal reflux disease)    Family history of anesthesia complication    Other chronic pain    Type 2 diabetes mellitus with diabetic chronic kidney disease ([place])    Lower extremity edema    Chronic constipation 06/23/2015   Chronic diastolic heart failure ([place]) 11/17/2014   Herniated nucleus pulposus, L2-3 right 02/04/2014    Class: Acute   Spondylolisthesis of lumbar region 04/30/2012    Class: Chronic   Lumbar stenosis with neurogenic claudication 04/30/2012    Class: Chronic   SYNCOPE-CAROTID SINUS 09/16/2008   OSTEOARTHRITIS 08/23/2006   DDD (degenerative disc disease), cervical 08/23/2006   DISC DISEASE, LUMBAR 08/23/2006   Headache(784.0) 08/23/2006   BREAST CANCER, HX OF 08/23/2006   HYPERPARATHYROIDISM, HX OF 08/23/2006   RENAL CALCULUS, HX OF 08/23/2006    REFERRING DIAG: OTHER spondylosis with myelopathy ,lumbar region    THERAPY DIAG:  Other low back pain  Muscle weakness (generalized)  Other abnormalities of gait and mobility  Rationale for Evaluation and Treatment Rehabilitation  PERTINENT HISTORY: Hx of lumbar fusions, CKD, Breast cancer, Cardiomyopathy, DM II, R TKA   PRECAUTIONS: None   SUBJECTIVE:                                                                                                                                                                                      SUBJECTIVE STATEMENT:  Pt presents to PT with reports of  continued significnat pain in lower back. Has been compliant with HEP with no adverse effect. Pt is ready to  begin PT at this time.    PAIN:  Are you having pain?  Yes: NPRS scale: 9/10 Worst: 7/10 Pain location: lower back, R LE Pain description: dull ache, N/T Aggravating factors: prolonged standing, walking Relieving factors: rest   OBJECTIVE: (objective measures completed at initial evaluation unless otherwise dated)  DIAGNOSTIC FINDINGS:  See imaging   PATIENT SURVEYS:  FOTO: 34% function; 43% predicted   COGNITION: Overall cognitive status: Within functional limits for tasks assessed               SENSATION: Light touch: Impaired - decreased light touch R LE   POSTURE: rounded shoulders, forward head, and increased lumbar lordosis   PALPATION: TTP to R posterior hip musculature    LUMBAR ROM:    AROM eval  Flexion 50% reduced  Extension 50% reduced  Right lateral flexion    Left lateral flexion    Right rotation    Left rotation     (Blank rows = not tested)   LOWER EXTREMITY MMT:     MMT Right eval Left eval  Hip flexion 3/5 3+/5  Hip extension      Hip abduction 3/5 3+/5  Hip adduction 3/5 3+/5  Hip internal rotation      Hip external rotation      Knee flexion 3+/5 4/5  Knee extension 3+/5 4/5  Ankle dorsiflexion      Ankle plantarflexion      Ankle inversion      Ankle eversion       (Blank rows = not tested)   LUMBAR SPECIAL TESTS:  Straight leg raise test: Positive and Slump test: Positive   FUNCTIONAL TESTS:  30 Second Sit to Stand: 5 reps   GAIT: Distance walked: 65f Assistive device utilized: Single point cane Level of assistance: Modified independence Comments: slowed, antalgic gait on R LE   TREATMENT: [facility] Adult PT Treatment:                                                DATE: 03/24/2022 Therapeutic Exercise: NuStep lvl 5 UE/LE x 4 min while taking subjective  Seated sciatic nerve glide x 10 each Seated ball squeeze 2x10 - 5" hold Seated clamshell 3x15 GTB Seated march 2x20 LTR x 10 each Supine SLR 2x10 each LAQ 2x10  2.5# Seated hamstring curl 2x10 GTB STS x 10   [facility] Adult PT Treatment:                                                DATE: 03/21/2022 Therapeutic Exercise: NuStep lvl 5 UE/LE x 4 min while taking subjective  Seated sciatic nerve glide x 10 each Seated clamshell 2x15 GTB LTR x 10 each Supine SLR 2x10 each Seated ball squeeze 2x10 - 3" hold Seated clamshell 2x15 BTB LAQ 2x10 2.5# Seated hamstring curl 2x10 GTB Standing heel raises x 10 Standing hip abd x 10 each  [facility] Adult PT Treatment:  DATE: 03/15/2022 Therapeutic Exercise: NuStep lvl 5 UE/LE x 4 min while taking subjective  Seated sciatic nerve glide x 10 each LTR x 10 each Supine SLR x 10 each Seated ball squeeze 2x10 - 3" hold Seated clamshell 2x15 BTB Seated march 2x20 BTB LAQ 2x10 2#  [facility] Adult PT Treatment:                                                DATE: 03/09/2022 Therapeutic Exercise: NuStep lvl 5 UE/LE x 4 min while taking subjective  Seated sciatic nerve glide x 10 each LTR x 10 each Supine SLR x 5 each Bridge (small range) x 5 LTR x 5 each Seated clamshell x 15 BTB  [facility] Adult PT Treatment:                                                DATE: 03/01/2022 Therapeutic Exercise: Seated sciatic nerve glide x 5 R Supine SLR x 5 each Bridge (small range) x 5 LTR x 5 each Seated clamshell x 15 BTB   PATIENT EDUCATION:  Education details: eval findings, FOTO, HEP, POC Person educated: Patient Education method: Explanation, Demonstration, and Handouts Education comprehension: verbalized understanding and returned demonstration   HOME EXERCISE PROGRAM: Access Code: [email or web address] URL: [email or web address] Date: 03/01/2022 Prepared by: Octavio Manns   Exercises - Seated Sciatic Tensioner  - 1 x daily - 7 x weekly - 2 sets - 10 reps - Active Straight Leg Raise with Quad Set  - 1 x daily - 7 x weekly - 2 sets - 10 reps - Supine Bridge  - 1 x daily  - 7 x weekly - 2 sets - 10 reps - Seated Hip Abduction with Resistance  - 1 x daily - 7 x weekly - 3 sets - 15 reps - Supine Lower Trunk Rotation  - 1 x daily - 7 x weekly - 2 sets - 10 reps   ASSESSMENT:   CLINICAL IMPRESSION: Pt was able to complete prescribed exercises with no adverse effect and tolerated treatment better today. Therapy focused on improving LE strength in order to decrease pain and improve functional mobility. PT will continue to progress pt as tolerated per POC.   OBJECTIVE IMPAIRMENTS: decreased activity tolerance, decreased balance, decreased endurance, decreased mobility, difficulty walking, decreased ROM, decreased strength, and pain.    ACTIVITY LIMITATIONS: carrying, lifting, bending, standing, squatting, stairs, transfers, and locomotion level   PARTICIPATION LIMITATIONS: meal prep, cleaning, driving, shopping, community activity, occupation, and yard work   PERSONAL FACTORS: Fitness, Time since onset of injury/illness/exacerbation, and 1-2 comorbidities: CKD, Breast cancer, Cardiomyopathy, DM II  are also affecting patient's functional outcome.      GOALS: Goals reviewed with patient? No   SHORT TERM GOALS: Target date: 03/22/2022   Pt will be compliant and knowledgeable with initial HEP for improved comfort and carryover Baseline: initial HEP given  Goal status: INITIAL   2.  Pt will self report lower back pain no greater than 5/10 for improved comfort and functional ability Baseline: 7/10 at worst Goal status: INITIAL    LONG TERM GOALS: Target date: 04/26/2022   Pt will self report lower back pain no greater than 2/10 for improved comfort and functional  ability Baseline: 7/10 at worst Goal status: INITIAL    2.  Pt will improve FOTO function score to no less than 43% as proxy for functional improvement Baseline: 34% function Goal status: INITIAL    3.  Pt will increase 30 Second Sit to Stand rep count to no less than 7 reps (MCID 2) for improved  balance, strength, and functional mobility Baseline: 5 reps  Goal status: INITIAL    4.  Pt will increase standing tolerance to no less than 20 minutes without pain for improved functional ability to perform home ADLs Baseline: 10 minutes Goal status: INITIAL   PLAN:   PT FREQUENCY: 2x/week   PT DURATION: 8 weeks   PLANNED INTERVENTIONS: Therapeutic exercises, Therapeutic activity, Neuromuscular re-education, Balance training, Gait training, Patient/Family education, Self Care, Joint mobilization, Aquatic Therapy, Dry Needling, Electrical stimulation, Cryotherapy, Moist heat, Manual therapy, and Re-evaluation.   PLAN FOR NEXT SESSION: assess HEP response, progress core and proximal hip strength, functional activity tolerance   Ward Chatters, PT 03/24/2022, 4:19 PM

## 2022-03-29 ENCOUNTER — Ambulatory Visit: Payer: Medicare HMO

## 2022-03-29 DIAGNOSIS — R2689 Other abnormalities of gait and mobility: Secondary | ICD-10-CM | POA: Diagnosis not present

## 2022-03-29 DIAGNOSIS — M6281 Muscle weakness (generalized): Secondary | ICD-10-CM

## 2022-03-29 DIAGNOSIS — M5459 Other low back pain: Secondary | ICD-10-CM

## 2022-03-29 NOTE — Therapy (Signed)
OUTPATIENT PHYSICAL THERAPY TREATMENT NOTE   Patient Name: [name] MRN: [id number] DOB:1950/12/23, 71 y.o., female Today's Date: 03/29/2022  PCP: [name], MD  REFERRING PROVIDER: Arne Cleveland. Heagen, PA-C   END OF SESSION:   PT End of Session - 03/29/22 1357     Visit Number 6    Number of Visits 17    Date for PT Re-Evaluation 04/26/22    Authorization Type [employer] MCR    PT Start Time 1400    PT Stop Time 1440    PT Time Calculation (min) 40 min    Activity Tolerance Patient tolerated treatment well    Behavior During Therapy WFL for tasks assessed/performed                 Past Medical History:  Diagnosis Date   Anginal pain ([place]) 03/19/2012   saw Dr. Cathie Olden .. she thinks its more related to stomach issues   Arthritis    "all over; mostly in my back" (03/20/2012)   Breast cancer ([place])    "right" (03/20/2012)   Cardiomyopathy    Chest pain 06/2005   Hospitalized, dystolic dysfunction   Chronic lower back pain    "have 2 herniated discs; going to have to have a fusion" (03/20/2012)   CKD (chronic kidney disease), stage III ([place])    Family history of anesthesia complication    "father has nausea and vomiting"   Gallstones    GERD (gastroesophageal reflux disease)    H/O hiatal hernia    History of kidney stones    History of right mastectomy    Hypertension    "patient states never had HTN, takes Hyzaar for heart   Hypothyroidism    Irritable bowel syndrome    Migraines    "it's been a long time since I've had one" (03/20/2012)   Osteoarthritis    Peripheral neuropathy    PONV (postoperative nausea and vomiting)    Sciatica    Type II diabetes mellitus ([place])    Past Surgical History:  Procedure Laterality Date   ABDOMINAL HYSTERECTOMY  1993   adenosin cardiolite  07/2005   (+) wall motion abnormality   AXILLARY LYMPH NODE DISSECTION  2003   squamous cell cancer   BACK SURGERY     CARDIAC CATHETERIZATION  ? 2005 / 2007    CHOLECYSTECTOMY N/A 01/24/2018   Procedure: LAPAROSCOPIC CHOLECYSTECTOMY;  Surgeon: Erroll Luna, MD;  Location: [facility];  Service: General;  Laterality: N/A;   CT abdomen and pelvis  02/2010   Same   ESOPHAGOGASTRODUODENOSCOPY  2005   GERD   KNEE ARTHROSCOPY  1990's   "right" (03/20/2012)   LUMBAR FUSION N/A 08/22/2014   Procedure: Right L2-3 and right L1-2 transforaminal lumbar interbody fusion with pedicle screws, rods, sleeves, cages, local bone graft, Vivigen, cancellous chips;  Surgeon: Jessy Oto, MD;  Location: [facility];  Service: Orthopedics;  Laterality: N/A;   LUMBAR LAMINECTOMY/DECOMPRESSION MICRODISCECTOMY N/A 02/04/2014   Procedure: RIGHT L2-3 MICRODISCECTOMY ;  Surgeon: Jessy Oto, MD;  Location: [place];  Service: Orthopedics;  Laterality: N/A;   MASTECTOMY Right    MASTECTOMY WITH AXILLARY LYMPH NODE DISSECTION  12/2003   "right" (03/20/2012)   POSTERIOR CERVICAL FUSION/FORAMINOTOMY  2001   SHOULDER ARTHROSCOPY W/ ROTATOR CUFF REPAIR  2003   Left   THYROIDECTOMY  1977   Right   TOTAL KNEE ARTHROPLASTY  2000   Right   [place] of abdomen  07/2005   Fatty liver /  kidney stones   Patient Active Problem List   Diagnosis Date Noted   Positive screening for depression on 9-item Patient Health Questionnaire (PHQ-9) 03/07/2022   Hammer toe of second toe of left foot 05/05/2021   Corns and callosities 05/05/2021   Hav (hallux abducto valgus), unspecified laterality 05/05/2021   Hyperlipidemia associated with type 2 diabetes mellitus ([place]) 02/01/2021   Depression 02/01/2021   Localized osteoporosis without current pathological fracture 05/01/2020   Hyperparathyroidism ([place]) 12/13/2019   Elevated hemoglobin ([place]) 12/13/2019   Chronic renal impairment, stage 4 (severe) ([place]) 10/28/2019   Anemia 10/28/2019   Physical exam 06/26/2019   Neck pain 04/18/2019   Ascending aortic aneurysm ([place]) 04/17/2019   Hypoxia    Nonspecific chest pain 04/15/2019   Neuropathic pain of right  flank 12/27/2018   Dysphagia 12/27/2018   Postsurgical hypothyroidism 06/11/2018   Vitamin D deficiency 06/11/2018   Multinodular goiter 06/11/2018   Hypercalcemia 06/11/2018   DM type 2 causing vascular disease ([facility])    PONV (postoperative nausea and vomiting)    Peripheral neuropathy    Migraine    Irritable bowel syndrome    Hypertension    History of right mastectomy    H/O hiatal hernia    GERD (gastroesophageal reflux disease)    Family history of anesthesia complication    Other chronic pain    Type 2 diabetes mellitus with diabetic chronic kidney disease ([employer])    Lower extremity edema    Chronic constipation 06/23/2015   Chronic diastolic heart failure ([place]) 11/17/2014   Herniated nucleus pulposus, L2-3 right 02/04/2014    Class: Acute   Spondylolisthesis of lumbar region 04/30/2012    Class: Chronic   Lumbar stenosis with neurogenic claudication 04/30/2012    Class: Chronic   SYNCOPE-CAROTID SINUS 09/16/2008   OSTEOARTHRITIS 08/23/2006   DDD (degenerative disc disease), cervical 08/23/2006   DISC DISEASE, LUMBAR 08/23/2006   Headache(784.0) 08/23/2006   BREAST CANCER, HX OF 08/23/2006   HYPERPARATHYROIDISM, HX OF 08/23/2006   RENAL CALCULUS, HX OF 08/23/2006    REFERRING DIAG: OTHER spondylosis with myelopathy ,lumbar region    THERAPY DIAG:  Other low back pain  Muscle weakness (generalized)  Other abnormalities of gait and mobility  Rationale for Evaluation and Treatment Rehabilitation  PERTINENT HISTORY: Hx of lumbar fusions, CKD, Breast cancer, Cardiomyopathy, DM II, R TKA   PRECAUTIONS: None   SUBJECTIVE:                                                                                                                                                                                      SUBJECTIVE STATEMENT:  Pt presents to PT with reports  of decreased pain in lower back. Has been compliant with no adverse effect. Ready to begin PT at this time.     PAIN:  Are you having pain?  Yes: NPRS scale: 7/10 Worst: 7/10 Pain location: lower back, R LE Pain description: dull ache, N/T Aggravating factors: prolonged standing, walking Relieving factors: rest   OBJECTIVE: (objective measures completed at initial evaluation unless otherwise dated)  DIAGNOSTIC FINDINGS:  See imaging   PATIENT SURVEYS:  FOTO: 34% function; 43% predicted   COGNITION: Overall cognitive status: Within functional limits for tasks assessed               SENSATION: Light touch: Impaired - decreased light touch R LE   POSTURE: rounded shoulders, forward head, and increased lumbar lordosis   PALPATION: TTP to R posterior hip musculature    LUMBAR ROM:    AROM eval  Flexion 50% reduced  Extension 50% reduced  Right lateral flexion    Left lateral flexion    Right rotation    Left rotation     (Blank rows = not tested)   LOWER EXTREMITY MMT:     MMT Right eval Left eval  Hip flexion 3/5 3+/5  Hip extension      Hip abduction 3/5 3+/5  Hip adduction 3/5 3+/5  Hip internal rotation      Hip external rotation      Knee flexion 3+/5 4/5  Knee extension 3+/5 4/5  Ankle dorsiflexion      Ankle plantarflexion      Ankle inversion      Ankle eversion       (Blank rows = not tested)   LUMBAR SPECIAL TESTS:  Straight leg raise test: Positive and Slump test: Positive   FUNCTIONAL TESTS:  30 Second Sit to Stand: 5 reps   GAIT: Distance walked: 17f Assistive device utilized: Single point cane Level of assistance: Modified independence Comments: slowed, antalgic gait on R LE   TREATMENT: [facility] Adult PT Treatment:                                                DATE: 03/29/2022 Therapeutic Exercise: NuStep lvl 5 UE/LE x 4 min while taking subjective  Seated sciatic nerve glide x 10 each Seated ball squeeze 3x10 - 5" hold Seated clamshell 3x15 BTB Seated march 2x20 BTB Standing hip abd 2x10 Lateral walk YTB at counter x 2 laps LTR x 10  each Supine SLR 2x10 each LAQ 2x10 2.5# Bridge 2x10 (small range)   [facility] Adult PT Treatment:                                                DATE: 03/24/2022 Therapeutic Exercise: NuStep lvl 5 UE/LE x 4 min while taking subjective  Seated sciatic nerve glide x 10 each Seated ball squeeze 2x10 - 5" hold Seated clamshell 3x15 GTB Seated march 2x20 LTR x 10 each Supine SLR 2x10 each LAQ 2x10 2.5# Seated hamstring curl 2x10 GTB STS x 10   [facility] Adult PT Treatment:  DATE: 03/21/2022 Therapeutic Exercise: NuStep lvl 5 UE/LE x 4 min while taking subjective  Seated sciatic nerve glide x 10 each Seated clamshell 2x15 GTB LTR x 10 each Supine SLR 2x10 each Seated ball squeeze 2x10 - 3" hold Seated clamshell 2x15 BTB LAQ 2x10 2.5# Seated hamstring curl 2x10 GTB Standing heel raises x 10 Standing hip abd x 10 each  [facility] Adult PT Treatment:                                                DATE: 03/15/2022 Therapeutic Exercise: NuStep lvl 5 UE/LE x 4 min while taking subjective  Seated sciatic nerve glide x 10 each LTR x 10 each Supine SLR x 10 each Seated ball squeeze 2x10 - 3" hold Seated clamshell 2x15 BTB Seated march 2x20 BTB LAQ 2x10 2#  [facility] Adult PT Treatment:                                                DATE: 03/09/2022 Therapeutic Exercise: NuStep lvl 5 UE/LE x 4 min while taking subjective  Seated sciatic nerve glide x 10 each LTR x 10 each Supine SLR x 5 each Bridge (small range) x 5 LTR x 5 each Seated clamshell x 15 BTB  [facility] Adult PT Treatment:                                                DATE: 03/01/2022 Therapeutic Exercise: Seated sciatic nerve glide x 5 R Supine SLR x 5 each Bridge (small range) x 5 LTR x 5 each Seated clamshell x 15 BTB   PATIENT EDUCATION:  Education details: eval findings, FOTO, HEP, POC Person educated: Patient Education method: Explanation, Demonstration, and Handouts Education  comprehension: verbalized understanding and returned demonstration   HOME EXERCISE PROGRAM: Access Code: [email or web address] URL: [email or web address] Date: 03/01/2022 Prepared by: Octavio Manns   Exercises - Seated Sciatic Tensioner  - 1 x daily - 7 x weekly - 2 sets - 10 reps - Active Straight Leg Raise with Quad Set  - 1 x daily - 7 x weekly - 2 sets - 10 reps - Supine Bridge  - 1 x daily - 7 x weekly - 2 sets - 10 reps - Seated Hip Abduction with Resistance  - 1 x daily - 7 x weekly - 3 sets - 15 reps - Supine Lower Trunk Rotation  - 1 x daily - 7 x weekly - 2 sets - 10 reps   ASSESSMENT:   CLINICAL IMPRESSION: Pt was able to complete prescribed exercises with no adverse effect and tolerated treatment better today with improved tolerance. Therapy focused on improving LE strength in order to decrease pain and improve functional mobility. PT will continue to progress pt as tolerated per POC.    OBJECTIVE IMPAIRMENTS: decreased activity tolerance, decreased balance, decreased endurance, decreased mobility, difficulty walking, decreased ROM, decreased strength, and pain.    ACTIVITY LIMITATIONS: carrying, lifting, bending, standing, squatting, stairs, transfers, and locomotion level   PARTICIPATION LIMITATIONS: meal prep, cleaning, driving, shopping, community activity, occupation, and yard work   PERSONAL FACTORS:  Fitness, Time since onset of injury/illness/exacerbation, and 1-2 comorbidities: CKD, Breast cancer, Cardiomyopathy, DM II  are also affecting patient's functional outcome.      GOALS: Goals reviewed with patient? No   SHORT TERM GOALS: Target date: 03/22/2022   Pt will be compliant and knowledgeable with initial HEP for improved comfort and carryover Baseline: initial HEP given  Goal status: INITIAL   2.  Pt will self report lower back pain no greater than 5/10 for improved comfort and functional ability Baseline: 7/10 at worst Goal status: INITIAL    LONG  TERM GOALS: Target date: 04/26/2022   Pt will self report lower back pain no greater than 2/10 for improved comfort and functional ability Baseline: 7/10 at worst Goal status: INITIAL    2.  Pt will improve FOTO function score to no less than 43% as proxy for functional improvement Baseline: 34% function Goal status: INITIAL    3.  Pt will increase 30 Second Sit to Stand rep count to no less than 7 reps (MCID 2) for improved balance, strength, and functional mobility Baseline: 5 reps  Goal status: INITIAL    4.  Pt will increase standing tolerance to no less than 20 minutes without pain for improved functional ability to perform home ADLs Baseline: 10 minutes Goal status: INITIAL   PLAN:   PT FREQUENCY: 2x/week   PT DURATION: 8 weeks   PLANNED INTERVENTIONS: Therapeutic exercises, Therapeutic activity, Neuromuscular re-education, Balance training, Gait training, Patient/Family education, Self Care, Joint mobilization, Aquatic Therapy, Dry Needling, Electrical stimulation, Cryotherapy, Moist heat, Manual therapy, and Re-evaluation.   PLAN FOR NEXT SESSION: assess HEP response, progress core and proximal hip strength, functional activity tolerance   Ward Chatters, PT 03/29/2022, 2:47 PM

## 2022-03-30 ENCOUNTER — Ambulatory Visit: Payer: Medicare HMO | Admitting: Pharmacist

## 2022-03-30 DIAGNOSIS — N184 Chronic kidney disease, stage 4 (severe): Secondary | ICD-10-CM

## 2022-03-30 DIAGNOSIS — E1169 Type 2 diabetes mellitus with other specified complication: Secondary | ICD-10-CM

## 2022-03-31 ENCOUNTER — Ambulatory Visit: Payer: Medicare HMO

## 2022-03-31 DIAGNOSIS — M6281 Muscle weakness (generalized): Secondary | ICD-10-CM | POA: Diagnosis not present

## 2022-03-31 DIAGNOSIS — M5459 Other low back pain: Secondary | ICD-10-CM | POA: Diagnosis not present

## 2022-03-31 DIAGNOSIS — R2689 Other abnormalities of gait and mobility: Secondary | ICD-10-CM

## 2022-03-31 NOTE — Therapy (Signed)
OUTPATIENT PHYSICAL THERAPY TREATMENT NOTE   Patient Name: [name] MRN: [id number] DOB:1950-07-15, 71 y.o., female Today's Date: 03/31/2022  PCP: [name], MD  REFERRING PROVIDER: Arne Cleveland. Heagen, PA-C   END OF SESSION:   PT End of Session - 03/31/22 1440     Visit Number 7    Number of Visits 17    Date for PT Re-Evaluation 04/26/22    Authorization Type [employer] MCR    PT Start Time 1245    PT Stop Time 1525    PT Time Calculation (min) 40 min    Activity Tolerance Patient tolerated treatment well    Behavior During Therapy WFL for tasks assessed/performed                  Past Medical History:  Diagnosis Date   Anginal pain ([place]) 03/19/2012   saw Dr. Cathie Olden .. she thinks its more related to stomach issues   Arthritis    "all over; mostly in my back" (03/20/2012)   Breast cancer ([place])    "right" (03/20/2012)   Cardiomyopathy    Chest pain 06/2005   Hospitalized, dystolic dysfunction   Chronic lower back pain    "have 2 herniated discs; going to have to have a fusion" (03/20/2012)   CKD (chronic kidney disease), stage III ([place])    Family history of anesthesia complication    "father has nausea and vomiting"   Gallstones    GERD (gastroesophageal reflux disease)    H/O hiatal hernia    History of kidney stones    History of right mastectomy    Hypertension    "patient states never had HTN, takes Hyzaar for heart   Hypothyroidism    Irritable bowel syndrome    Migraines    "it's been a long time since I've had one" (03/20/2012)   Osteoarthritis    Peripheral neuropathy    PONV (postoperative nausea and vomiting)    Sciatica    Type II diabetes mellitus ([place])    Past Surgical History:  Procedure Laterality Date   ABDOMINAL HYSTERECTOMY  1993   adenosin cardiolite  07/2005   (+) wall motion abnormality   AXILLARY LYMPH NODE DISSECTION  2003   squamous cell cancer   BACK SURGERY     CARDIAC CATHETERIZATION  ? 2005 / 2007    CHOLECYSTECTOMY N/A 01/24/2018   Procedure: LAPAROSCOPIC CHOLECYSTECTOMY;  Surgeon: Erroll Luna, MD;  Location: [facility];  Service: General;  Laterality: N/A;   CT abdomen and pelvis  02/2010   Same   ESOPHAGOGASTRODUODENOSCOPY  2005   GERD   KNEE ARTHROSCOPY  1990's   "right" (03/20/2012)   LUMBAR FUSION N/A 08/22/2014   Procedure: Right L2-3 and right L1-2 transforaminal lumbar interbody fusion with pedicle screws, rods, sleeves, cages, local bone graft, Vivigen, cancellous chips;  Surgeon: Jessy Oto, MD;  Location: [facility];  Service: Orthopedics;  Laterality: N/A;   LUMBAR LAMINECTOMY/DECOMPRESSION MICRODISCECTOMY N/A 02/04/2014   Procedure: RIGHT L2-3 MICRODISCECTOMY ;  Surgeon: Jessy Oto, MD;  Location: [facility];  Service: Orthopedics;  Laterality: N/A;   MASTECTOMY Right    MASTECTOMY WITH AXILLARY LYMPH NODE DISSECTION  12/2003   "right" (03/20/2012)   POSTERIOR CERVICAL FUSION/FORAMINOTOMY  2001   SHOULDER ARTHROSCOPY W/ ROTATOR CUFF REPAIR  2003   Left   THYROIDECTOMY  1977   Right   TOTAL KNEE ARTHROPLASTY  2000   Right   [place] of abdomen  07/2005   Fatty liver /  kidney stones   Patient Active Problem List   Diagnosis Date Noted   Positive screening for depression on 9-item Patient Health Questionnaire (PHQ-9) 03/07/2022   Hammer toe of second toe of left foot 05/05/2021   Corns and callosities 05/05/2021   Hav (hallux abducto valgus), unspecified laterality 05/05/2021   Hyperlipidemia associated with type 2 diabetes mellitus ([place]) 02/01/2021   Depression 02/01/2021   Localized osteoporosis without current pathological fracture 05/01/2020   Hyperparathyroidism ([place]) 12/13/2019   Elevated hemoglobin ([place]) 12/13/2019   Chronic renal impairment, stage 4 (severe) ([place]) 10/28/2019   Anemia 10/28/2019   Physical exam 06/26/2019   Neck pain 04/18/2019   Ascending aortic aneurysm ([place]) 04/17/2019   Hypoxia    Nonspecific chest pain 04/15/2019   Neuropathic pain of right  flank 12/27/2018   Dysphagia 12/27/2018   Postsurgical hypothyroidism 06/11/2018   Vitamin D deficiency 06/11/2018   Multinodular goiter 06/11/2018   Hypercalcemia 06/11/2018   DM type 2 causing vascular disease ([facility])    PONV (postoperative nausea and vomiting)    Peripheral neuropathy    Migraine    Irritable bowel syndrome    Hypertension    History of right mastectomy    H/O hiatal hernia    GERD (gastroesophageal reflux disease)    Family history of anesthesia complication    Other chronic pain    Type 2 diabetes mellitus with diabetic chronic kidney disease ([place])    Lower extremity edema    Chronic constipation 06/23/2015   Chronic diastolic heart failure ([place]) 11/17/2014   Herniated nucleus pulposus, L2-3 right 02/04/2014    Class: Acute   Spondylolisthesis of lumbar region 04/30/2012    Class: Chronic   Lumbar stenosis with neurogenic claudication 04/30/2012    Class: Chronic   SYNCOPE-CAROTID SINUS 09/16/2008   OSTEOARTHRITIS 08/23/2006   DDD (degenerative disc disease), cervical 08/23/2006   DISC DISEASE, LUMBAR 08/23/2006   Headache(784.0) 08/23/2006   BREAST CANCER, HX OF 08/23/2006   HYPERPARATHYROIDISM, HX OF 08/23/2006   RENAL CALCULUS, HX OF 08/23/2006    REFERRING DIAG: OTHER spondylosis with myelopathy ,lumbar region    THERAPY DIAG:  Other low back pain  Muscle weakness (generalized)  Other abnormalities of gait and mobility  Rationale for Evaluation and Treatment Rehabilitation  PERTINENT HISTORY: Hx of lumbar fusions, CKD, Breast cancer, Cardiomyopathy, DM II, R TKA   PRECAUTIONS: None   SUBJECTIVE:                                                                                                                                                                                      SUBJECTIVE STATEMENT:  Pt presents to PT with reports  of continued lower back pain. Has continued HEP compliance with no adverse effect. Pt is ready to begin PT at this  time.    PAIN:  Are you having pain?  Yes: NPRS scale: 7/10 Worst: 7/10 Pain location: lower back, R LE Pain description: dull ache, N/T Aggravating factors: prolonged standing, walking Relieving factors: rest   OBJECTIVE: (objective measures completed at initial evaluation unless otherwise dated)  DIAGNOSTIC FINDINGS:  See imaging   PATIENT SURVEYS:  FOTO: 34% function; 43% predicted   COGNITION: Overall cognitive status: Within functional limits for tasks assessed               SENSATION: Light touch: Impaired - decreased light touch R LE   POSTURE: rounded shoulders, forward head, and increased lumbar lordosis   PALPATION: TTP to R posterior hip musculature    LUMBAR ROM:    AROM eval  Flexion 50% reduced  Extension 50% reduced  Right lateral flexion    Left lateral flexion    Right rotation    Left rotation     (Blank rows = not tested)   LOWER EXTREMITY MMT:     MMT Right eval Left eval  Hip flexion 3/5 3+/5  Hip extension      Hip abduction 3/5 3+/5  Hip adduction 3/5 3+/5  Hip internal rotation      Hip external rotation      Knee flexion 3+/5 4/5  Knee extension 3+/5 4/5  Ankle dorsiflexion      Ankle plantarflexion      Ankle inversion      Ankle eversion       (Blank rows = not tested)   LUMBAR SPECIAL TESTS:  Straight leg raise test: Positive and Slump test: Positive   FUNCTIONAL TESTS:  30 Second Sit to Stand: 5 reps   GAIT: Distance walked: 68f Assistive device utilized: Single point cane Level of assistance: Modified independence Comments: slowed, antalgic gait on R LE   TREATMENT: [facility] Adult PT Treatment:                                                DATE: 03/31/2022 Therapeutic Exercise: NuStep lvl 5 UE/LE x 4 min while taking subjective  Seated sciatic nerve glide x 10 each Seated ball squeeze 3x10 - 5" hold Seated clamshell 3x15 BTB Seated march 2x20 BTB Standing hip abd 2x10 YTB Lateral walk YTB at counter x 2  laps LTR x 10 each LAQ 2x10 2.5# Seated hamstring curl 2x10 BTB Bridge 2x10 (small range)  Supine SLR 2x10 each  [facility] Adult PT Treatment:                                                DATE: 03/29/2022 Therapeutic Exercise: NuStep lvl 5 UE/LE x 4 min while taking subjective  Seated sciatic nerve glide x 10 each Seated ball squeeze 3x10 - 5" hold Seated clamshell 3x15 BTB Seated march 2x20 BTB Standing hip abd 2x10 Lateral walk YTB at counter x 2 laps LTR x 10 each Supine SLR 2x10 each LAQ 2x10 2.5# Bridge 2x10 (small range)   [facility] Adult PT Treatment:  DATE: 03/24/2022 Therapeutic Exercise: NuStep lvl 5 UE/LE x 4 min while taking subjective  Seated sciatic nerve glide x 10 each Seated ball squeeze 2x10 - 5" hold Seated clamshell 3x15 GTB Seated march 2x20 LTR x 10 each Supine SLR 2x10 each LAQ 2x10 2.5# Seated hamstring curl 2x10 GTB STS x 10   [facility] Adult PT Treatment:                                                DATE: 03/21/2022 Therapeutic Exercise: NuStep lvl 5 UE/LE x 4 min while taking subjective  Seated sciatic nerve glide x 10 each Seated clamshell 2x15 GTB LTR x 10 each Supine SLR 2x10 each Seated ball squeeze 2x10 - 3" hold Seated clamshell 2x15 BTB LAQ 2x10 2.5# Seated hamstring curl 2x10 GTB Standing heel raises x 10 Standing hip abd x 10 each  [facility] Adult PT Treatment:                                                DATE: 03/15/2022 Therapeutic Exercise: NuStep lvl 5 UE/LE x 4 min while taking subjective  Seated sciatic nerve glide x 10 each LTR x 10 each Supine SLR x 10 each Seated ball squeeze 2x10 - 3" hold Seated clamshell 2x15 BTB Seated march 2x20 BTB LAQ 2x10 2#  [facility] Adult PT Treatment:                                                DATE: 03/09/2022 Therapeutic Exercise: NuStep lvl 5 UE/LE x 4 min while taking subjective  Seated sciatic nerve glide x 10 each LTR x 10 each Supine SLR x 5  each Bridge (small range) x 5 LTR x 5 each Seated clamshell x 15 BTB  PATIENT EDUCATION:  Education details: eval findings, FOTO, HEP, POC Person educated: Patient Education method: Explanation, Demonstration, and Handouts Education comprehension: verbalized understanding and returned demonstration   HOME EXERCISE PROGRAM: Access Code: [email or web address] URL: [email or web address] Date: 03/01/2022 Prepared by: Octavio Manns   Exercises - Seated Sciatic Tensioner  - 1 x daily - 7 x weekly - 2 sets - 10 reps - Active Straight Leg Raise with Quad Set  - 1 x daily - 7 x weekly - 2 sets - 10 reps - Supine Bridge  - 1 x daily - 7 x weekly - 2 sets - 10 reps - Seated Hip Abduction with Resistance  - 1 x daily - 7 x weekly - 3 sets - 15 reps - Supine Lower Trunk Rotation  - 1 x daily - 7 x weekly - 2 sets - 10 reps   ASSESSMENT:   CLINICAL IMPRESSION: Pt was able to complete prescribed exercises with no adverse effect and tolerated treatment better today with improved tolerance. Therapy focused on improving LE strength in order to decrease pain and improve functional mobility. PT will continue to progress pt as tolerated per POC.     OBJECTIVE IMPAIRMENTS: decreased activity tolerance, decreased balance, decreased endurance, decreased mobility, difficulty walking, decreased ROM, decreased strength, and pain.    ACTIVITY LIMITATIONS: carrying, lifting, bending,  standing, squatting, stairs, transfers, and locomotion level   PARTICIPATION LIMITATIONS: meal prep, cleaning, driving, shopping, community activity, occupation, and yard work   PERSONAL FACTORS: Fitness, Time since onset of injury/illness/exacerbation, and 1-2 comorbidities: CKD, Breast cancer, Cardiomyopathy, DM II  are also affecting patient's functional outcome.      GOALS: Goals reviewed with patient? No   SHORT TERM GOALS: Target date: 03/22/2022   Pt will be compliant and knowledgeable with initial HEP for improved  comfort and carryover Baseline: initial HEP given  Goal status: INITIAL   2.  Pt will self report lower back pain no greater than 5/10 for improved comfort and functional ability Baseline: 7/10 at worst Goal status: INITIAL    LONG TERM GOALS: Target date: 04/26/2022   Pt will self report lower back pain no greater than 2/10 for improved comfort and functional ability Baseline: 7/10 at worst Goal status: INITIAL    2.  Pt will improve FOTO function score to no less than 43% as proxy for functional improvement Baseline: 34% function Goal status: INITIAL    3.  Pt will increase 30 Second Sit to Stand rep count to no less than 7 reps (MCID 2) for improved balance, strength, and functional mobility Baseline: 5 reps  Goal status: INITIAL    4.  Pt will increase standing tolerance to no less than 20 minutes without pain for improved functional ability to perform home ADLs Baseline: 10 minutes Goal status: INITIAL   PLAN:   PT FREQUENCY: 2x/week   PT DURATION: 8 weeks   PLANNED INTERVENTIONS: Therapeutic exercises, Therapeutic activity, Neuromuscular re-education, Balance training, Gait training, Patient/Family education, Self Care, Joint mobilization, Aquatic Therapy, Dry Needling, Electrical stimulation, Cryotherapy, Moist heat, Manual therapy, and Re-evaluation.   PLAN FOR NEXT SESSION: assess HEP response, progress core and proximal hip strength, functional activity tolerance   Ward Chatters, PT 03/31/2022, 3:26 PM

## 2022-03-31 NOTE — Patient Instructions (Signed)
Visit Information   Goals Addressed               This Visit's Progress     Set Target A1c (pt-stated)   On track     Timeframe:  Long-Range Goal Priority:  Medium Start Date:    02/18/2020                         Expected End Date August 2023                     Barriers: Health Behaviors Knowledge   - set target A1C-maintain level of 7.0 or less  -patient will monitor cbgs more frequently in the home -patient will adhere to diabetic diet at least 75-100% of the time.   Why is this important?   Your target A1C is decided together by you and your doctor.  It is based on several things like your age and other health issues.    Notes:  04/21/20-Most recent A1C on file 8.8(Nov 2021) 06/24/2020-Most recent A1C level7.9(Feb 2022) 09/10/20-Patient due for A1C testing-last reading 7.9 in Feb.   12/10/2020-Most recent A1C 9.9(July 2022).  03/47/42-[id number] due to duplicate care plan and goals  09/22/21 - agreed to come in for A1c       Patient Care Plan: Nonspecific Low Back Pain (Adult)     Problem Identified: Acute Low Back Pain      Problem Identified: Adjustment to Low Back Pain      Problem Identified: Chronic Low Back Pain      Long-Range Goal: Chronic Low Back Pain Managed Completed 02/22/2021  Start Date: 02/18/2020  Expected End Date: 03/10/2021  Recent Progress: On track  Priority: High  Note:   Evidence-based guidance:  Assess pain at regular intervals using self-report (most reliable), family/caregiver report, validated pain scale; consider impact on quality of life.  Determine if pain is associated with mobility or at rest, location, intensity, frequency, duration, recurrence, pattern and description (e.g., cramping, burning, aching), triggers and relieving factors.  Mutually develop an interdisciplinary and multimodal pain management plan with patient and caregiver; track the patient's response to the pain management plan.  Partner with patient to  set a comfort goal that allows for return to work, usual activity and acceptable quality of life.  Prepare patient for use of pharmacologic therapy in a stepped, single agent or combination approach that may include acetaminophen, nonsteroidal anti-inflammatory drug, antidepressant, nonbenzodiazepine muscle relaxant, lidocaine patch.  Evaluate risk for use of opioid medication, which may be prescribed for a limited about of time.  Encourage use of multimodal interventions, such as physical therapy, cognitive behavior therapy, massage, distraction, yoga, relaxation, chiropractic manipulation, herbal supplement, acupuncture, application of heat or cold.  Encourage use of local anesthetic or analgesic therapy as an adjunct for pain control, including capsaicin cream, topical nonsteroidal anti-inflammatory drug.  Evaluate pain level, efficacy, tolerability and adherence to pain management plan.  Manage medication-induced effects, such as constipation, nausea, urinary retention, somnolence and dizziness.  Prepare patient for referral to pain education program, pain management support or community resources.  Prepare patient for referral to pain management specialist, neurologist, physical therapist, addiction specialist (if history of substance use), psychotherapist; advocate for consultation with pharmacist.  Provide follow-up and monitoring to acknowledge chronicity and manageability of pain.   Notes:   04/21/20-Patient with progressive pain-seeing second opinion specialist this afternoon  06/24/2020 RN CM reviewed pain mgmt with pt.  RN CM assessed  for recent MD appts RN CM discussed treatment options-risks & benefits  09/10/2020 RN CM completed pain assessment RN CM reviewed post op care RN CM confirmed pt has MD f/u appt RN CM reviewed with pt s/s of worsening condition and when to seek medical attention  12/10/2020  RN CM reinforced and pain relief measures and MD follow up  appt.  35/57/32-[id number] due to duplicate care plan and goals    Task: Partner to Develop Chronic Low Back Pain Plan Completed 02/18/2020  Note:      Problem Identified: Functional Decline      Long-Range Goal: Maintain Mobility and Function Completed 06/24/2020  Start Date: 02/18/2020  Expected End Date: 06/08/2020  This Visit's Progress: On track  Recent Progress: On track  Priority: High  Note:   Evidence-based guidance:  Emphasize the importance of physical activity and aerobic exercise as included in treatment plan; assess barriers to adherence; consider patient's abilities and preferences.  Encourage gradual increase in activity or exercise instead of stopping if pain occurs.  Reinforce individual therapy exercise prescription, such as strengthening, stabilization and stretching programs.  Promote optimal body mechanics to stabilize the spine with lifting and functional activity.  Encourage activity and mobility modifications to facilitate optimal function, such as using a log roll for bed mobility or dressing from a seated position.  Reinforce individual adaptive equipment recommendations to limit excessive spinal movements, such as a Systems analyst.  Assess adequacy of sleep; encourage use of sleep hygiene techniques, such as bedtime routine; use of white noise; dark, cool bedroom; avoiding daytime naps, heavy meals or exercise before bedtime.  Promote positions and modification to optimize sleep and sexual activity; consider pillows or positioning devices to assist in maintaining neutral spine.  Explore options for applying ergonomic principles at work and home, such as frequent position changes, using ergonomically designed equipment and working at optimal height.  Promote modifications to increase comfort with driving such as lumbar support, optimizing seat and steering wheel position, using cruise control and taking frequent rest stops to stretch and walk.   Notes:       Task: Maintain or Improve Strength and Functional Ability Completed 02/18/2020       Patient Care Plan: Diabetes Type 2 (Adult)     Problem Identified: Glycemic Management (Diabetes, Type 2)      Long-Range Goal: Glycemic Management Optimized-Maintain A1C  level of 7.0 or less Completed 02/22/2021  Start Date: 02/18/2020  Expected End Date: 04/09/2021  Recent Progress: Not on track  Priority: High  Note:   Evidence-based guidance:  Anticipate A1C testing (point-of-care) every 3 to 6 months based on goal attainment.  Review mutually-set A1C goal or target range.  Anticipate use of antihyperglycemic with or without insulin and periodic adjustments; consider active involvement of pharmacist.  Provide medical nutrition therapy and development of individualized eating.  Compare self-reported symptoms of hypo or hyperglycemia to blood glucose levels, diet and fluid intake, current medications, psychosocial and physiologic stressors, change in activity and barriers to care adherence.  Promote self-monitoring of blood glucose levels.  Assess and address barriers to management plan, such as food insecurity, age, developmental ability, depression, anxiety, fear of hypoglycemia or weight gain, as well as medication cost, side effects and complicated regimen.  Consider referral to community-based diabetes education program, visiting nurse, community health worker or health coach.  Encourage regular dental care for treatment of periodontal disease; refer to dental provider when needed.   Notes:   06/24/2020 RN CM praised patient  for lowering of A1C level RN CM assessed for med adherence and completed med review and review DM meds  RN CM provided education on diabetic diet RN CM reviewed cbg monitoring log and values with patient/caregiver.  12/10/2020 RN CM educated on blood glucose monitoring, freq of monitoring and when to alert MD.  RN CM assessed for blood glucose monitoring and any  barriers.  RN CM discussed importance of adhering to treatment plan. RN CM assessed for med adherence and completed med review and review DM meds   72/53/66-[id number] due to duplicate care plan and goals    Task: Alleviate Barriers to Glycemic Management Completed 02/22/2021       Problem Identified: Disease Progression (Diabetes, Type 2)      Patient Care Plan: CCM Pharmacy Care Plan     Problem Identified: Hypertension, Hyperlipidemia, Diabetes, GERD, COPD, Chronic Kidney Disease and Osteoporosis   Priority: High     Long-Range Goal: Disease management   Start Date: 08/31/2020  Expected End Date: 08/31/2021  Recent Progress: On track  Priority: High  Note:   Pharmacist Clinical Goal(s):  Patient will achieve adherence to monitoring guidelines and medication adherence to achieve therapeutic efficacy contact provider office for questions/concerns as evidenced notation of same in electronic health record through collaboration with PharmD and provider.   Interventions: 1:1 collaboration with [name], MD regarding development and update of comprehensive plan of care as evidenced by provider attestation and co-signature Inter-disciplinary care team collaboration (see longitudinal plan of care) Comprehensive medication review performed; medication list updated in electronic medical record  Diabetes (A1c goal <7%) -Uncontrolled -Current medications: Novolin 70/30 40 units BID with a meal Appropriate, Effective Glipizide XL '5mg'$  dialy Appropriate, Query effective Farxiga '10mg'$  daily Appropriate, Query effective, -Medications previously tried: Anibal Henderson  -Current home glucose readings fasting glucose: not checking glucose post prandial glucose: not checking glucose regularly -Denies hypoglycemic/hyperglycemic symptoms -Current exercise: minimal -Educated on A1c and blood sugar goals; Prevention and management of hypoglycemic episodes; Benefits of routine  self-monitoring of blood sugar; -Counseled to check feet daily and get yearly eye exams -Recommended to continue current medication Stressed importance of medication adherence.  She does report to taking insulin but does admit to missing some doses.  Have asked her to check glucose for next week and record - will adjust based off of reported readings.  Update 09/22/21 Has not had A1c since July 2022 where it was 9.9. Not really checking sugar often at home.  When she does, most readings are in the 200s per her report. Denies any symptoms of low blood sugar. Asked her to call and schedule a FU visit with Dr. Birdie Riddle for fasting labs. I would consider a GLP-1 if patient was willing, I can assist with cost if needed. If we did this I would also consider stopping her glipizide due to hypoglycemia risk and previous reports of feeling jittery. Will FU after A1c to assess needs.   Hypertension (BP goal <130/80) -Controlled -At times not taking torsemide when leaving the house due to increased urination. As of today is not using carvedilol or amlodipine  -Current treatment: Hydralazine 50 mg three times daily Appropriate, Query effective, Losartan 25 mg once daily Appropriate, Query effective, Torsemide 20 mg BID Appropriate, Query effective, Amlodipine '10mg'$  daily Appropriate, Query effective, Carvedilol 6.'26mg'$  twice daily Appropriate, Query effective, -Meds needing clarifcation: amlodipine, carvedilol -Current home readings: at goal -Current exercise habits: no formal exercise - limited due to chronic pain - planing on getting spinal  cord stimulator  -Denies hypotensive/hypertensive symptoms -Educated on Daily salt intake goal < 2300 mg; Importance of home blood pressure monitoring; -Reviewed importance of being consistent with torsemide regimen  -Counseled to monitor BP at home 1-2x/week, document, and provide log at future appointments -Recommended to continue current medication    Update 06/09/21 Patient reports she has not been taking any of her oral medication on regular basis.  She reports a BP of systolic 397Q one time, unclear how accurate this was.  She denies any dizziness or HA.  We spent most of the visit talking about importance of adherence.  Patient agrees to start taking her medications more regularly and agrees to me having someone check in with her regularly to see if she is taking her medications. She gets mail order which is preferred so [facility] services were not an option. We cannot adjust BP meds if she is not taking what she is prescribed. Will have CMA call patient next week to ensure she is back on track with medications.  Update 09/22/21 Reports 128/60 last week. Denies dizziness. She is not checking BP that often. Fill history shows all meds were recently filled 08/11/21 for 90 ds supply which shows improved adherence since out last conversation. She does have increased headaches lately. Recommended she come in for physical, she is due for fasting labs and would help her to have her BP checked. No changes to meds at this time.  Osteoporosis (goal prevent fractures) -Controlled -Reports to be using consistently without issue  -Current regimen: Alendronate 70 mg once weekly -Reviewed side effects - no problems noted  -Last DEXA 03/2020 - osteoporosis  Hyperlipidemia: (LDL goal < 70) -Controlled based off of 05/2020 labs -Several cardiac risk factors  -Current treatment: Rosuvastatin 20 mg once daily (90 DS sent 4/22) -Reviewed adherence with patient - reports consistent use. Denies any recent side effects of problems -Educated on Cholesterol goals;  Benefits of statin for ASCVD risk reduction; -Recommended to continue current medication  -Per Dr Stanford Breed pt will need f/u imagining march 2023 on thoracic aortic aneursym   GERD (Goal: minimize symptoms, optimize medication safety) -Controlled -Daily ASA -Some ongoing symptoms but feels  controlled well overall. -Several questions about metoclopramide and pantoprazole - reviewed at length with patient, all questions answered. -Current treatment: Pantoprazole 40 mg twice daily - 1 tablet twice daily  Metoclopramide 10 mg four times daily  -Dietary triggers reviewed at length  -No problems with current regimen per patient. No side effects noted -Recommended continue current medications   Patient Goals/Self-Care Activities Patient will:  - target a minimum of 150 minutes of moderate intensity exercise weekly engage in dietary modifications by minimizing snacking   Follow Up Plan: Telephone follow up appointment with care management team member scheduled for: Medication Assistance: None required.  Patient affirms current coverage meets needs.       Patient Care Plan: RN Care Manager POC     Problem Identified: Ongoing Disease Mgmt Education and Support of Chronic Conditions(DM, chronic back pain)   Priority: High     Long-Range Goal: Development of POC for Mgmt of Chronic Conditions   Start Date: 02/22/2021  Expected End Date: 02/22/2022  Priority: High  Note:    Current Barriers:  Chronic Disease Management support and education needs related to DMII and chronic back pain  RNCM Clinical Goal(s):  Patient will verbalize basic understanding of  DMII and chronic back pain, disease process and self health management plan of chronic conditions take all  medications exactly as prescribed and will call provider for medication related questions attend all scheduled medical appointments: including PCP and specialists continue to work with RN Care Manager to address care management and care coordination needs related to  DMII and chronic back pain  through collaboration with RN Care manager, provider, and care team.   Interventions: POC sent to PCP upon initial assessment, quarterly and with any changes in patient's conditions Inter-disciplinary care team collaboration  (see longitudinal plan of care) Evaluation of current treatment plan related to  self management and patient's adherence to plan as established by provider  Pain Interventions: Pain assessment performed Medications reviewed Reviewed provider established plan for pain management; Discussed importance of adherence to all scheduled medical appointments; Counseled on the importance of reporting any/all new or changed pain symptoms or management strategies to pain management provider; Reviewed with patient prescribed pharmacological and nonpharmacological pain relief strategies; Advised patient to discuss pain mgmt, back stimulator settings with provider;   Diabetes Interventions: Assessed patient's understanding of A1c goal: <7% Reviewed medications with patient and discussed importance of medication adherence; Counseled on importance of regular laboratory monitoring as prescribed; Reviewed scheduled/upcoming provider appointments including: PCP and specialists; Assesssed for routien DM preventive exams completion-pt completed recent podiatry visit and routine toenail care Lab Results  Component Value Date   HGBA1C 9.9 (A) 10/28/2020   Patient Goals/Self-Care Activities: Patient will self administer medications as prescribed Patient will attend all scheduled provider appointments Patient will continue to perform ADL's independently Patient will call provider office for new concerns or questions Patient will repot any worsening and/or unresolved sxs to MD   Patient will continue to monitor cbgs in the home  Follow Up Plan:  Telephone follow up appointment with care management team member scheduled for:  quarterly call-within the month of Feb. The patient has been provided with contact information for the care management team and has been advised to call with any health related questions or concerns.       Patient Care Plan: General Pharmacy (Adult)     Problem Identified:  Hypertension, Hyperlipidemia, Diabetes, GERD, COPD, Chronic Kidney Disease and Osteoporosis   Priority: High  Onset Date: 06/10/2021     Long-Range Goal: Patient-Specific Goal   Start Date: 06/10/2021  Expected End Date: 12/11/2021  Recent Progress: On track  Priority: High  Note:   Pharmacist Clinical Goal(s):  Patient will achieve adherence to monitoring guidelines and medication adherence to achieve therapeutic efficacy contact provider office for questions/concerns as evidenced notation of same in electronic health record through collaboration with PharmD and provider.   Interventions: 1:1 collaboration with [name], MD regarding development and update of comprehensive plan of care as evidenced by provider attestation and co-signature Inter-disciplinary care team collaboration (see longitudinal plan of care) Comprehensive medication review performed; medication list updated in electronic medical record  Diabetes (A1c goal <7%) 03/30/22 -Uncontrolled, last A1c 8.3% -Current medications: Novolin 70/30 35 units BID with a meal Appropriate, Query Effective Farxiga '10mg'$  Appropriate, Query effective, -Medications previously tried: Glyxambi, Toujeo, glipizide -Current home glucose readings fasting glucose: 160s  post prandial glucose:  -Denies hypoglycemic/hyperglycemic symptoms -Current exercise: minimal, back pain is really holding her back -Educated on A1c and blood sugar goals; Prevention and management of hypoglycemic episodes; Benefits of routine self-monitoring of blood sugar; Recent A1c of 8.3% - her insulin was increased to 35 units twice daily reports fasting glucose is in the 160 range. Not having any hypoglycemia.  Discussion on dietary choices today.  Will wait and see how she does with increased insulin.  Consider basal/bolus regimen if she cannot control with this.  Limited with GLP-1 due to gastroparesis. Metformin limited due to CKD. FU next month on glucose  readings.  Hypertension (BP goal <130/80) -Controlled, based on office BP - not assessed -Current treatment: Hydralazine 50 mg three times daily Appropriate, Effective, safe, accessible Torsemide 20 mg BID Appropriate, Effective, safe, accessible Amlodipine '10mg'$  dailyAppropriate, Effective, safe, accessible Carvedilol 6.'26mg'$  twice daily AAppropriate, Effective, safe, accessible -Current home readings: unsure of what it has been lately -Current exercise habits: no formal exercise - limited due to chronic pain - planing on getting spinal cord stimulator  -Reports some dizziness - have asked her to check her BP when she feels this. No changes to meds at this time, pending home BP monitoring will assess for hypotension with reported dizziness. No changes at this time - CMA to FU in 2 weeks on home BP.   Update 06/09/21 Patient reports she has not been taking any of her oral medication on regular basis.  She reports a BP of systolic 967R one time, unclear how accurate this was.  She denies any dizziness or HA.  We spent most of the visit talking about importance of adherence.  Patient agrees to start taking her medications more regularly and agrees to me having someone check in with her regularly to see if she is taking her medications. She gets mail order which is preferred so [facility] services were not an option. We cannot adjust BP meds if she is not taking what she is prescribed. Will have CMA call patient next week to ensure she is back on track with medications.  Osteoporosis (goal prevent fractures) -Controlled, not assessed -Reports to be using consistently without issue  -Current regimen: None -Reviewed side effects - no problems noted  -Last DEXA 03/2020 - osteoporosis  Hyperlipidemia: (LDL goal < 70) 03/31/22 -Controlled, due for repeat lipid panel -Several cardiac risk factors  -Current treatment: Rosuvastatin 20 mg once daily (90 DS sent 4/22) Appropriate, Effective, Safe,  Accessible -Reviewed adherence with patient - reports consistent use. Denies any recent side effects of problems -Educated on Cholesterol goals;  Benefits of statin for ASCVD risk reduction; -Recommended to continue current medication  -Patient is due for an updated lipid panel. -Current ASCVD risk is 23.9% - recommend high intensity statin which she is on.  However, most recent GFR is 16 back in July.  Max dose of Crestor for this GFR is '10mg'$ .  Would recommend switch to Atorvastatin '40mg'$  to prevent kidney damage. Will consult with Dr. Birdie Riddle on this. FU on statin switch in 30 days. Need labs in 6 weeks.  GERD (Goal: minimize symptoms, optimize medication safety) -Controlled, not assessed -Daily ASA -Some ongoing symptoms but feels controlled well overall. -Several questions about metoclopramide and pantoprazole - reviewed at length with patient, all questions answered. -Current treatment: Pantoprazole 40 mg twice daily - 1 tablet twice daily  Metoclopramide 10 mg four times daily  -Dietary triggers reviewed at length  -No problems with current regimen per patient. No side effects noted -Recommended continue current medications   Patient Goals/Self-Care Activities Patient will:  - target a minimum of 150 minutes of moderate intensity exercise weekly engage in dietary modifications by minimizing snacking   Follow Up Plan: Telephone follow up appointment with care management team member scheduled for: Medication Assistance: None required.  Patient affirms current coverage meets needs.  The patient verbalized understanding of instructions, educational materials, and care plan provided today and DECLINED offer to receive copy of patient instructions, educational materials, and care plan.  Telephone follow up appointment with pharmacy team member scheduled for: 6 months  Edythe Clarity, [place], PharmD Clinical Pharmacist  [facility] [phone]

## 2022-04-12 ENCOUNTER — Ambulatory Visit: Payer: Medicare HMO | Attending: Family Medicine

## 2022-04-12 DIAGNOSIS — M4716 Other spondylosis with myelopathy, lumbar region: Secondary | ICD-10-CM | POA: Diagnosis not present

## 2022-04-12 DIAGNOSIS — M6281 Muscle weakness (generalized): Secondary | ICD-10-CM | POA: Diagnosis not present

## 2022-04-12 DIAGNOSIS — M5459 Other low back pain: Secondary | ICD-10-CM | POA: Insufficient documentation

## 2022-04-12 DIAGNOSIS — R2689 Other abnormalities of gait and mobility: Secondary | ICD-10-CM | POA: Insufficient documentation

## 2022-04-12 NOTE — Therapy (Signed)
OUTPATIENT PHYSICAL THERAPY TREATMENT NOTE   Patient Name: [name] MRN: [id number] DOB:30-Nov-1950, 72 y.o., female Today's Date: 04/12/2022  PCP: [name], MD  REFERRING PROVIDER: Arne Cleveland. Heagen, PA-C   END OF SESSION:   PT End of Session - 04/12/22 1430     Visit Number 8    Number of Visits 17    Date for PT Re-Evaluation 04/26/22    Authorization Type [employer] MCR    PT Start Time 6720    PT Stop Time 1525    PT Time Calculation (min) 40 min    Activity Tolerance Patient tolerated treatment well    Behavior During Therapy WFL for tasks assessed/performed                   Past Medical History:  Diagnosis Date   Anginal pain ([place]) 03/19/2012   saw Dr. Cathie Olden .. she thinks its more related to stomach issues   Arthritis    "all over; mostly in my back" (03/20/2012)   Breast cancer ([place])    "right" (03/20/2012)   Cardiomyopathy    Chest pain 06/2005   Hospitalized, dystolic dysfunction   Chronic lower back pain    "have 2 herniated discs; going to have to have a fusion" (03/20/2012)   CKD (chronic kidney disease), stage III ([place])    Family history of anesthesia complication    "father has nausea and vomiting"   Gallstones    GERD (gastroesophageal reflux disease)    H/O hiatal hernia    History of kidney stones    History of right mastectomy    Hypertension    "patient states never had HTN, takes Hyzaar for heart   Hypothyroidism    Irritable bowel syndrome    Migraines    "it's been a long time since I've had one" (03/20/2012)   Osteoarthritis    Peripheral neuropathy    PONV (postoperative nausea and vomiting)    Sciatica    Type II diabetes mellitus ([place])    Past Surgical History:  Procedure Laterality Date   ABDOMINAL HYSTERECTOMY  1993   adenosin cardiolite  07/2005   (+) wall motion abnormality   AXILLARY LYMPH NODE DISSECTION  2003   squamous cell cancer   BACK SURGERY     CARDIAC CATHETERIZATION  ? 2005 / 2007    CHOLECYSTECTOMY N/A 01/24/2018   Procedure: LAPAROSCOPIC CHOLECYSTECTOMY;  Surgeon: Erroll Luna, MD;  Location: [facility];  Service: General;  Laterality: N/A;   CT abdomen and pelvis  02/2010   Same   ESOPHAGOGASTRODUODENOSCOPY  2005   GERD   KNEE ARTHROSCOPY  1990's   "right" (03/20/2012)   LUMBAR FUSION N/A 08/22/2014   Procedure: Right L2-3 and right L1-2 transforaminal lumbar interbody fusion with pedicle screws, rods, sleeves, cages, local bone graft, Vivigen, cancellous chips;  Surgeon: Jessy Oto, MD;  Location: [facility];  Service: Orthopedics;  Laterality: N/A;   LUMBAR LAMINECTOMY/DECOMPRESSION MICRODISCECTOMY N/A 02/04/2014   Procedure: RIGHT L2-3 MICRODISCECTOMY ;  Surgeon: Jessy Oto, MD;  Location: [facility];  Service: Orthopedics;  Laterality: N/A;   MASTECTOMY Right    MASTECTOMY WITH AXILLARY LYMPH NODE DISSECTION  12/2003   "right" (03/20/2012)   POSTERIOR CERVICAL FUSION/FORAMINOTOMY  2001   SHOULDER ARTHROSCOPY W/ ROTATOR CUFF REPAIR  2003   Left   THYROIDECTOMY  1977   Right   TOTAL KNEE ARTHROPLASTY  2000   Right   [place] of abdomen  07/2005   Fatty  liver / kidney stones   Patient Active Problem List   Diagnosis Date Noted   Positive screening for depression on 9-item Patient Health Questionnaire (PHQ-9) 03/07/2022   Hammer toe of second toe of left foot 05/05/2021   Corns and callosities 05/05/2021   Hav (hallux abducto valgus), unspecified laterality 05/05/2021   Hyperlipidemia associated with type 2 diabetes mellitus ([place]) 02/01/2021   Depression 02/01/2021   Localized osteoporosis without current pathological fracture 05/01/2020   Hyperparathyroidism ([place]) 12/13/2019   Elevated hemoglobin ([place]) 12/13/2019   Chronic renal impairment, stage 4 (severe) ([place]) 10/28/2019   Anemia 10/28/2019   Physical exam 06/26/2019   Neck pain 04/18/2019   Ascending aortic aneurysm ([place]) 04/17/2019   Hypoxia    Nonspecific chest pain 04/15/2019   Neuropathic pain of right  flank 12/27/2018   Dysphagia 12/27/2018   Postsurgical hypothyroidism 06/11/2018   Vitamin D deficiency 06/11/2018   Multinodular goiter 06/11/2018   Hypercalcemia 06/11/2018   DM type 2 causing vascular disease ([facility])    PONV (postoperative nausea and vomiting)    Peripheral neuropathy    Migraine    Irritable bowel syndrome    Hypertension    History of right mastectomy    H/O hiatal hernia    GERD (gastroesophageal reflux disease)    Family history of anesthesia complication    Other chronic pain    Type 2 diabetes mellitus with diabetic chronic kidney disease ([place])    Lower extremity edema    Chronic constipation 06/23/2015   Chronic diastolic heart failure ([place]) 11/17/2014   Herniated nucleus pulposus, L2-3 right 02/04/2014    Class: Acute   Spondylolisthesis of lumbar region 04/30/2012    Class: Chronic   Lumbar stenosis with neurogenic claudication 04/30/2012    Class: Chronic   SYNCOPE-CAROTID SINUS 09/16/2008   OSTEOARTHRITIS 08/23/2006   DDD (degenerative disc disease), cervical 08/23/2006   DISC DISEASE, LUMBAR 08/23/2006   Headache(784.0) 08/23/2006   BREAST CANCER, HX OF 08/23/2006   HYPERPARATHYROIDISM, HX OF 08/23/2006   RENAL CALCULUS, HX OF 08/23/2006    REFERRING DIAG: OTHER spondylosis with myelopathy ,lumbar region    THERAPY DIAG:  Other low back pain  Muscle weakness (generalized)  Other abnormalities of gait and mobility  Rationale for Evaluation and Treatment Rehabilitation  PERTINENT HISTORY: Hx of lumbar fusions, CKD, Breast cancer, Cardiomyopathy, DM II, R TKA   PRECAUTIONS: None   SUBJECTIVE:                                                                                                                                                                                      SUBJECTIVE STATEMENT:  Pt presents to PT  with reports of continued LBP. Has been HEP compliant with no adverse effect. Ready to begin PT at this time.    PAIN:  Are  you having pain?  Yes: NPRS scale: 7/10 Worst: 7/10 Pain location: lower back, R LE Pain description: dull ache, N/T Aggravating factors: prolonged standing, walking Relieving factors: rest   OBJECTIVE: (objective measures completed at initial evaluation unless otherwise dated)  DIAGNOSTIC FINDINGS:  See imaging   PATIENT SURVEYS:  FOTO: 34% function; 43% predicted   COGNITION: Overall cognitive status: Within functional limits for tasks assessed               SENSATION: Light touch: Impaired - decreased light touch R LE   POSTURE: rounded shoulders, forward head, and increased lumbar lordosis   PALPATION: TTP to R posterior hip musculature    LUMBAR ROM:    AROM eval  Flexion 50% reduced  Extension 50% reduced  Right lateral flexion    Left lateral flexion    Right rotation    Left rotation     (Blank rows = not tested)   LOWER EXTREMITY MMT:     MMT Right eval Left eval  Hip flexion 3/5 3+/5  Hip extension      Hip abduction 3/5 3+/5  Hip adduction 3/5 3+/5  Hip internal rotation      Hip external rotation      Knee flexion 3+/5 4/5  Knee extension 3+/5 4/5  Ankle dorsiflexion      Ankle plantarflexion      Ankle inversion      Ankle eversion       (Blank rows = not tested)   LUMBAR SPECIAL TESTS:  Straight leg raise test: Positive and Slump test: Positive   FUNCTIONAL TESTS:  30 Second Sit to Stand: 5 reps   GAIT: Distance walked: 45f Assistive device utilized: Single point cane Level of assistance: Modified independence Comments: slowed, antalgic gait on R LE   TREATMENT: [facility] Adult PT Treatment:                                                DATE: 04/12/2022 Therapeutic Exercise: NuStep lvl 5 UE/LE x 5 min while taking subjective  Standing mini squat x 10 Lateral walk YTB at counter x 2 laps Standing hip abd 2x10 YTB LAQ 2x10 3# Seated march 2x20 BTB Seated sciatic nerve glide x 10 each Seated ball squeeze 2x15 - 5" hold Seated  clamshell 3x15 Black TB LTR x 10 each Seated hamstring curl 2x10 BTB Bridge x 10 (small range)  Supine SLR 2x10 each  [facility] Adult PT Treatment:                                                DATE: 03/31/2022 Therapeutic Exercise: NuStep lvl 5 UE/LE x 4 min while taking subjective  Seated sciatic nerve glide x 10 each Seated ball squeeze 3x10 - 5" hold Seated clamshell 3x15 BTB Seated march 2x20 BTB Standing hip abd 2x10 YTB Lateral walk YTB at counter x 2 laps LTR x 10 each LAQ 2x10 2.5# Seated hamstring curl 2x10 BTB Bridge 2x10 (small range)  Supine SLR 2x10 each  [facility] Adult PT Treatment:  DATE: 03/29/2022 Therapeutic Exercise: NuStep lvl 5 UE/LE x 4 min while taking subjective  Seated sciatic nerve glide x 10 each Seated ball squeeze 3x10 - 5" hold Seated clamshell 3x15 BTB Seated march 2x20 BTB Standing hip abd 2x10 Lateral walk YTB at counter x 2 laps LTR x 10 each Supine SLR 2x10 each LAQ 2x10 2.5# Bridge 2x10 (small range)   [facility] Adult PT Treatment:                                                DATE: 03/24/2022 Therapeutic Exercise: NuStep lvl 5 UE/LE x 4 min while taking subjective  Seated sciatic nerve glide x 10 each Seated ball squeeze 2x10 - 5" hold Seated clamshell 3x15 GTB Seated march 2x20 LTR x 10 each Supine SLR 2x10 each LAQ 2x10 2.5# Seated hamstring curl 2x10 GTB STS x 10   [facility] Adult PT Treatment:                                                DATE: 03/21/2022 Therapeutic Exercise: NuStep lvl 5 UE/LE x 4 min while taking subjective  Seated sciatic nerve glide x 10 each Seated clamshell 2x15 GTB LTR x 10 each Supine SLR 2x10 each Seated ball squeeze 2x10 - 3" hold Seated clamshell 2x15 BTB LAQ 2x10 2.5# Seated hamstring curl 2x10 GTB Standing heel raises x 10 Standing hip abd x 10 each  [facility] Adult PT Treatment:                                                DATE: 03/15/2022 Therapeutic  Exercise: NuStep lvl 5 UE/LE x 4 min while taking subjective  Seated sciatic nerve glide x 10 each LTR x 10 each Supine SLR x 10 each Seated ball squeeze 2x10 - 3" hold Seated clamshell 2x15 BTB Seated march 2x20 BTB LAQ 2x10 2#  [facility] Adult PT Treatment:                                                DATE: 03/09/2022 Therapeutic Exercise: NuStep lvl 5 UE/LE x 4 min while taking subjective  Seated sciatic nerve glide x 10 each LTR x 10 each Supine SLR x 5 each Bridge (small range) x 5 LTR x 5 each Seated clamshell x 15 BTB  PATIENT EDUCATION:  Education details: eval findings, FOTO, HEP, POC Person educated: Patient Education method: Explanation, Demonstration, and Handouts Education comprehension: verbalized understanding and returned demonstration   HOME EXERCISE PROGRAM: Access Code: [email or web address] URL: [email or web address] Date: 03/01/2022 Prepared by: Octavio Manns   Exercises - Seated Sciatic Tensioner  - 1 x daily - 7 x weekly - 2 sets - 10 reps - Active Straight Leg Raise with Quad Set  - 1 x daily - 7 x weekly - 2 sets - 10 reps - Supine Bridge  - 1 x daily - 7 x weekly - 2 sets - 10 reps - Seated Hip Abduction with Resistance  -  1 x daily - 7 x weekly - 3 sets - 15 reps - Supine Lower Trunk Rotation  - 1 x daily - 7 x weekly - 2 sets - 10 reps   ASSESSMENT:   CLINICAL IMPRESSION: Pt was able to complete prescribed exercises with no adverse effect and tolerated treatment better today with improved tolerance. Therapy focused on improving LE and core strength in order to decrease pain and improve functional mobility. PT will continue to progress pt as tolerated per POC.       OBJECTIVE IMPAIRMENTS: decreased activity tolerance, decreased balance, decreased endurance, decreased mobility, difficulty walking, decreased ROM, decreased strength, and pain.    ACTIVITY LIMITATIONS: carrying, lifting, bending, standing, squatting, stairs, transfers, and locomotion  level   PARTICIPATION LIMITATIONS: meal prep, cleaning, driving, shopping, community activity, occupation, and yard work   PERSONAL FACTORS: Fitness, Time since onset of injury/illness/exacerbation, and 1-2 comorbidities: CKD, Breast cancer, Cardiomyopathy, DM II  are also affecting patient's functional outcome.      GOALS: Goals reviewed with patient? No   SHORT TERM GOALS: Target date: 03/22/2022   Pt will be compliant and knowledgeable with initial HEP for improved comfort and carryover Baseline: initial HEP given  Goal status: INITIAL   2.  Pt will self report lower back pain no greater than 5/10 for improved comfort and functional ability Baseline: 7/10 at worst Goal status: INITIAL    LONG TERM GOALS: Target date: 04/26/2022   Pt will self report lower back pain no greater than 2/10 for improved comfort and functional ability Baseline: 7/10 at worst Goal status: INITIAL    2.  Pt will improve FOTO function score to no less than 43% as proxy for functional improvement Baseline: 34% function Goal status: INITIAL    3.  Pt will increase 30 Second Sit to Stand rep count to no less than 7 reps (MCID 2) for improved balance, strength, and functional mobility Baseline: 5 reps  Goal status: INITIAL    4.  Pt will increase standing tolerance to no less than 20 minutes without pain for improved functional ability to perform home ADLs Baseline: 10 minutes Goal status: INITIAL   PLAN:   PT FREQUENCY: 2x/week   PT DURATION: 8 weeks   PLANNED INTERVENTIONS: Therapeutic exercises, Therapeutic activity, Neuromuscular re-education, Balance training, Gait training, Patient/Family education, Self Care, Joint mobilization, Aquatic Therapy, Dry Needling, Electrical stimulation, Cryotherapy, Moist heat, Manual therapy, and Re-evaluation.   PLAN FOR NEXT SESSION: assess HEP response, progress core and proximal hip strength, functional activity tolerance   Ward Chatters, PT 04/12/2022,  3:31 PM

## 2022-04-14 ENCOUNTER — Ambulatory Visit: Payer: Medicare HMO

## 2022-04-14 DIAGNOSIS — R2689 Other abnormalities of gait and mobility: Secondary | ICD-10-CM | POA: Diagnosis not present

## 2022-04-14 DIAGNOSIS — M5459 Other low back pain: Secondary | ICD-10-CM

## 2022-04-14 DIAGNOSIS — M6281 Muscle weakness (generalized): Secondary | ICD-10-CM | POA: Diagnosis not present

## 2022-04-14 NOTE — Therapy (Signed)
OUTPATIENT PHYSICAL THERAPY TREATMENT NOTE   Patient Name: [name] MRN: [id number] DOB:01-31-1951, 72 y.o., female Today's Date: 04/14/2022  PCP: [name], MD  REFERRING PROVIDER: Arne Cleveland. Heagen, PA-C   END OF SESSION:   PT End of Session - 04/14/22 1439     Visit Number 9    Number of Visits 17    Date for PT Re-Evaluation 04/26/22    Authorization Type [employer] MCR    PT Start Time 0102    PT Stop Time 1528    PT Time Calculation (min) 43 min    Activity Tolerance Patient tolerated treatment well    Behavior During Therapy WFL for tasks assessed/performed                    Past Medical History:  Diagnosis Date   Anginal pain ([place]) 03/19/2012   saw Dr. Cathie Olden .. she thinks its more related to stomach issues   Arthritis    "all over; mostly in my back" (03/20/2012)   Breast cancer ([place])    "right" (03/20/2012)   Cardiomyopathy    Chest pain 06/2005   Hospitalized, dystolic dysfunction   Chronic lower back pain    "have 2 herniated discs; going to have to have a fusion" (03/20/2012)   CKD (chronic kidney disease), stage III ([place])    Family history of anesthesia complication    "father has nausea and vomiting"   Gallstones    GERD (gastroesophageal reflux disease)    H/O hiatal hernia    History of kidney stones    History of right mastectomy    Hypertension    "patient states never had HTN, takes Hyzaar for heart   Hypothyroidism    Irritable bowel syndrome    Migraines    "it's been a long time since I've had one" (03/20/2012)   Osteoarthritis    Peripheral neuropathy    PONV (postoperative nausea and vomiting)    Sciatica    Type II diabetes mellitus ([place])    Past Surgical History:  Procedure Laterality Date   ABDOMINAL HYSTERECTOMY  1993   adenosin cardiolite  07/2005   (+) wall motion abnormality   AXILLARY LYMPH NODE DISSECTION  2003   squamous cell cancer   BACK SURGERY     CARDIAC CATHETERIZATION  ? 2005 / 2007    CHOLECYSTECTOMY N/A 01/24/2018   Procedure: LAPAROSCOPIC CHOLECYSTECTOMY;  Surgeon: Erroll Luna, MD;  Location: [facility];  Service: General;  Laterality: N/A;   CT abdomen and pelvis  02/2010   Same   ESOPHAGOGASTRODUODENOSCOPY  2005   GERD   KNEE ARTHROSCOPY  1990's   "right" (03/20/2012)   LUMBAR FUSION N/A 08/22/2014   Procedure: Right L2-3 and right L1-2 transforaminal lumbar interbody fusion with pedicle screws, rods, sleeves, cages, local bone graft, Vivigen, cancellous chips;  Surgeon: Jessy Oto, MD;  Location: [facility];  Service: Orthopedics;  Laterality: N/A;   LUMBAR LAMINECTOMY/DECOMPRESSION MICRODISCECTOMY N/A 02/04/2014   Procedure: RIGHT L2-3 MICRODISCECTOMY ;  Surgeon: Jessy Oto, MD;  Location: [facility];  Service: Orthopedics;  Laterality: N/A;   MASTECTOMY Right    MASTECTOMY WITH AXILLARY LYMPH NODE DISSECTION  12/2003   "right" (03/20/2012)   POSTERIOR CERVICAL FUSION/FORAMINOTOMY  2001   SHOULDER ARTHROSCOPY W/ ROTATOR CUFF REPAIR  2003   Left   THYROIDECTOMY  1977   Right   TOTAL KNEE ARTHROPLASTY  2000   Right   [place] of abdomen  07/2005  Fatty liver / kidney stones   Patient Active Problem List   Diagnosis Date Noted   Positive screening for depression on 9-item Patient Health Questionnaire (PHQ-9) 03/07/2022   Hammer toe of second toe of left foot 05/05/2021   Corns and callosities 05/05/2021   Hav (hallux abducto valgus), unspecified laterality 05/05/2021   Hyperlipidemia associated with type 2 diabetes mellitus ([place]) 02/01/2021   Depression 02/01/2021   Localized osteoporosis without current pathological fracture 05/01/2020   Hyperparathyroidism ([place]) 12/13/2019   Elevated hemoglobin ([place]) 12/13/2019   Chronic renal impairment, stage 4 (severe) ([place]) 10/28/2019   Anemia 10/28/2019   Physical exam 06/26/2019   Neck pain 04/18/2019   Ascending aortic aneurysm ([place]) 04/17/2019   Hypoxia    Nonspecific chest pain 04/15/2019   Neuropathic pain of right  flank 12/27/2018   Dysphagia 12/27/2018   Postsurgical hypothyroidism 06/11/2018   Vitamin D deficiency 06/11/2018   Multinodular goiter 06/11/2018   Hypercalcemia 06/11/2018   DM type 2 causing vascular disease ([facility])    PONV (postoperative nausea and vomiting)    Peripheral neuropathy    Migraine    Irritable bowel syndrome    Hypertension    History of right mastectomy    H/O hiatal hernia    GERD (gastroesophageal reflux disease)    Family history of anesthesia complication    Other chronic pain    Type 2 diabetes mellitus with diabetic chronic kidney disease ([place])    Lower extremity edema    Chronic constipation 06/23/2015   Chronic diastolic heart failure ([place]) 11/17/2014   Herniated nucleus pulposus, L2-3 right 02/04/2014    Class: Acute   Spondylolisthesis of lumbar region 04/30/2012    Class: Chronic   Lumbar stenosis with neurogenic claudication 04/30/2012    Class: Chronic   SYNCOPE-CAROTID SINUS 09/16/2008   OSTEOARTHRITIS 08/23/2006   DDD (degenerative disc disease), cervical 08/23/2006   DISC DISEASE, LUMBAR 08/23/2006   Headache(784.0) 08/23/2006   BREAST CANCER, HX OF 08/23/2006   HYPERPARATHYROIDISM, HX OF 08/23/2006   RENAL CALCULUS, HX OF 08/23/2006    REFERRING DIAG: OTHER spondylosis with myelopathy ,lumbar region    THERAPY DIAG:  Other low back pain  Muscle weakness (generalized)  Other abnormalities of gait and mobility  Rationale for Evaluation and Treatment Rehabilitation  PERTINENT HISTORY: Hx of lumbar fusions, CKD, Breast cancer, Cardiomyopathy, DM II, R TKA   PRECAUTIONS: None   SUBJECTIVE:                                                                                                                                                                                      SUBJECTIVE STATEMENT:  Pt presents to  PT with reports of continued back pain. Has been compliant with HEP with no adverse effect. Pt is ready to begin PT at this time.     PAIN:  Are you having pain?  Yes: NPRS scale: 7/10 Worst: 7/10 Pain location: lower back, R LE Pain description: dull ache, N/T Aggravating factors: prolonged standing, walking Relieving factors: rest   OBJECTIVE: (objective measures completed at initial evaluation unless otherwise dated)  DIAGNOSTIC FINDINGS:  See imaging   PATIENT SURVEYS:  FOTO: 34% function; 43% predicted   COGNITION: Overall cognitive status: Within functional limits for tasks assessed               SENSATION: Light touch: Impaired - decreased light touch R LE   POSTURE: rounded shoulders, forward head, and increased lumbar lordosis   PALPATION: TTP to R posterior hip musculature    LUMBAR ROM:    AROM eval  Flexion 50% reduced  Extension 50% reduced  Right lateral flexion    Left lateral flexion    Right rotation    Left rotation     (Blank rows = not tested)   LOWER EXTREMITY MMT:     MMT Right eval Left eval  Hip flexion 3/5 3+/5  Hip extension      Hip abduction 3/5 3+/5  Hip adduction 3/5 3+/5  Hip internal rotation      Hip external rotation      Knee flexion 3+/5 4/5  Knee extension 3+/5 4/5  Ankle dorsiflexion      Ankle plantarflexion      Ankle inversion      Ankle eversion       (Blank rows = not tested)   LUMBAR SPECIAL TESTS:  Straight leg raise test: Positive and Slump test: Positive   FUNCTIONAL TESTS:  30 Second Sit to Stand: 5 reps   GAIT: Distance walked: 58f Assistive device utilized: Single point cane Level of assistance: Modified independence Comments: slowed, antalgic gait on R LE   TREATMENT: [facility] Adult PT Treatment:                                                DATE: 04/14/2022 Therapeutic Exercise: NuStep lvl 5 UE/LE x 5 min while taking subjective  Lateral walk YTB at counter x 2 laps Standing hip abd/ext 2x10 YTB LAQ 2x15 3# Seated ball squeeze 2x15 - 3" hold Seated sciatic nerve glide x 10 each Seated hamstring curl 2x10 BTB LTR x  10 each Supine [name] x 10 - 5" hold Supine march x 20 Bridge x 10 (small range)  Supine SLR 2x10 each STS 2x10 - high table  [facility] Adult PT Treatment:                                                DATE: 04/12/2022 Therapeutic Exercise: NuStep lvl 5 UE/LE x 5 min while taking subjective  Standing mini squat x 10 Lateral walk YTB at counter x 2 laps Standing hip abd 2x10 YTB LAQ 2x10 3# Seated march 2x20 BTB Seated sciatic nerve glide x 10 each Seated ball squeeze 2x15 - 5" hold Seated clamshell 3x15 Black TB LTR x 10 each Seated hamstring curl 2x10 BTB Bridge x 10 (small range)  Supine SLR 2x10 each  [facility] Adult PT Treatment:                                                DATE: 03/31/2022 Therapeutic Exercise: NuStep lvl 5 UE/LE x 4 min while taking subjective  Seated sciatic nerve glide x 10 each Seated ball squeeze 3x10 - 5" hold Seated clamshell 3x15 BTB Seated march 2x20 BTB Standing hip abd 2x10 YTB Lateral walk YTB at counter x 2 laps LTR x 10 each LAQ 2x10 2.5# Seated hamstring curl 2x10 BTB Bridge 2x10 (small range)  Supine SLR 2x10 each   PATIENT EDUCATION:  Education details: eval findings, FOTO, HEP, POC Person educated: Patient Education method: Explanation, Demonstration, and Handouts Education comprehension: verbalized understanding and returned demonstration   HOME EXERCISE PROGRAM: Access Code: [email or web address] URL: [email or web address] Date: 03/01/2022 Prepared by: Octavio Manns   Exercises - Seated Sciatic Tensioner  - 1 x daily - 7 x weekly - 2 sets - 10 reps - Active Straight Leg Raise with Quad Set  - 1 x daily - 7 x weekly - 2 sets - 10 reps - Supine Bridge  - 1 x daily - 7 x weekly - 2 sets - 10 reps - Seated Hip Abduction with Resistance  - 1 x daily - 7 x weekly - 3 sets - 15 reps - Supine Lower Trunk Rotation  - 1 x daily - 7 x weekly - 2 sets - 10 reps   ASSESSMENT:   CLINICAL IMPRESSION: Pt was able to complete prescribed  exercises with no adverse effect. Therapy focused on improving LE and core strength in order to decrease pain and improve functional mobility. PT will continue to progress pt as tolerated per POC.          OBJECTIVE IMPAIRMENTS: decreased activity tolerance, decreased balance, decreased endurance, decreased mobility, difficulty walking, decreased ROM, decreased strength, and pain.    ACTIVITY LIMITATIONS: carrying, lifting, bending, standing, squatting, stairs, transfers, and locomotion level   PARTICIPATION LIMITATIONS: meal prep, cleaning, driving, shopping, community activity, occupation, and yard work   PERSONAL FACTORS: Fitness, Time since onset of injury/illness/exacerbation, and 1-2 comorbidities: CKD, Breast cancer, Cardiomyopathy, DM II  are also affecting patient's functional outcome.      GOALS: Goals reviewed with patient? No   SHORT TERM GOALS: Target date: 03/22/2022   Pt will be compliant and knowledgeable with initial HEP for improved comfort and carryover Baseline: initial HEP given  Goal status: INITIAL   2.  Pt will self report lower back pain no greater than 5/10 for improved comfort and functional ability Baseline: 7/10 at worst Goal status: INITIAL    LONG TERM GOALS: Target date: 04/26/2022   Pt will self report lower back pain no greater than 2/10 for improved comfort and functional ability Baseline: 7/10 at worst Goal status: INITIAL    2.  Pt will improve FOTO function score to no less than 43% as proxy for functional improvement Baseline: 34% function Goal status: INITIAL    3.  Pt will increase 30 Second Sit to Stand rep count to no less than 7 reps (MCID 2) for improved balance, strength, and functional mobility Baseline: 5 reps  Goal status: INITIAL    4.  Pt will increase standing tolerance to no less than 20 minutes without pain for improved functional ability to perform home  ADLs Baseline: 10 minutes Goal status: INITIAL   PLAN:   PT  FREQUENCY: 2x/week   PT DURATION: 8 weeks   PLANNED INTERVENTIONS: Therapeutic exercises, Therapeutic activity, Neuromuscular re-education, Balance training, Gait training, Patient/Family education, Self Care, Joint mobilization, Aquatic Therapy, Dry Needling, Electrical stimulation, Cryotherapy, Moist heat, Manual therapy, and Re-evaluation.   PLAN FOR NEXT SESSION: assess HEP response, progress core and proximal hip strength, functional activity tolerance   Ward Chatters, PT 04/14/2022, 3:33 PM

## 2022-04-20 NOTE — Therapy (Addendum)
OUTPATIENT PHYSICAL THERAPY TREATMENT NOTE/PROGRESS NOTE  Progress Note Reporting Period 03/01/2022 to 04/21/2022  See note below for Objective Data and Assessment of Progress/Goals.      Patient Name: [name] MRN: [id number] DOB:12/02/1950, 72 y.o., female Today's Date: 04/21/2022  PCP: [name], MD  REFERRING PROVIDER: Arne Cleveland. Heagen, PA-C   END OF SESSION:   PT End of Session - 04/21/22 1448     Visit Number 10    Number of Visits 17    Date for PT Re-Evaluation 05/26/22    Authorization Type [employer] MCR    PT Start Time 3785    PT Stop Time 1525    PT Time Calculation (min) 40 min    Activity Tolerance Patient tolerated treatment well    Behavior During Therapy WFL for tasks assessed/performed                     Past Medical History:  Diagnosis Date   Anginal pain ([place]) 03/19/2012   saw Dr. Cathie Olden .. she thinks its more related to stomach issues   Arthritis    "all over; mostly in my back" (03/20/2012)   Breast cancer ([place])    "right" (03/20/2012)   Cardiomyopathy    Chest pain 06/2005   Hospitalized, dystolic dysfunction   Chronic lower back pain    "have 2 herniated discs; going to have to have a fusion" (03/20/2012)   CKD (chronic kidney disease), stage III ([place])    Family history of anesthesia complication    "father has nausea and vomiting"   Gallstones    GERD (gastroesophageal reflux disease)    H/O hiatal hernia    History of kidney stones    History of right mastectomy    Hypertension    "patient states never had HTN, takes Hyzaar for heart   Hypothyroidism    Irritable bowel syndrome    Migraines    "it's been a long time since I've had one" (03/20/2012)   Osteoarthritis    Peripheral neuropathy    PONV (postoperative nausea and vomiting)    Sciatica    Type II diabetes mellitus ([place])    Past Surgical History:  Procedure Laterality Date   ABDOMINAL HYSTERECTOMY  1993   adenosin cardiolite  07/2005    (+) wall motion abnormality   AXILLARY LYMPH NODE DISSECTION  2003   squamous cell cancer   BACK SURGERY     CARDIAC CATHETERIZATION  ? 2005 / 2007   CHOLECYSTECTOMY N/A 01/24/2018   Procedure: LAPAROSCOPIC CHOLECYSTECTOMY;  Surgeon: Erroll Luna, MD;  Location: [facility];  Service: General;  Laterality: N/A;   CT abdomen and pelvis  02/2010   Same   ESOPHAGOGASTRODUODENOSCOPY  2005   GERD   KNEE ARTHROSCOPY  1990's   "right" (03/20/2012)   LUMBAR FUSION N/A 08/22/2014   Procedure: Right L2-3 and right L1-2 transforaminal lumbar interbody fusion with pedicle screws, rods, sleeves, cages, local bone graft, Vivigen, cancellous chips;  Surgeon: Jessy Oto, MD;  Location: [facility];  Service: Orthopedics;  Laterality: N/A;   LUMBAR LAMINECTOMY/DECOMPRESSION MICRODISCECTOMY N/A 02/04/2014   Procedure: RIGHT L2-3 MICRODISCECTOMY ;  Surgeon: Jessy Oto, MD;  Location: [facility];  Service: Orthopedics;  Laterality: N/A;   MASTECTOMY Right    MASTECTOMY WITH AXILLARY LYMPH NODE DISSECTION  12/2003   "right" (03/20/2012)   POSTERIOR CERVICAL FUSION/FORAMINOTOMY  2001   SHOULDER ARTHROSCOPY W/ ROTATOR CUFF REPAIR  2003   Left   THYROIDECTOMY  1977   Right   TOTAL KNEE ARTHROPLASTY  2000   Right   [place] of abdomen  07/2005   Fatty liver / kidney stones   Patient Active Problem List   Diagnosis Date Noted   Positive screening for depression on 9-item Patient Health Questionnaire (PHQ-9) 03/07/2022   Hammer toe of second toe of left foot 05/05/2021   Corns and callosities 05/05/2021   Hav (hallux abducto valgus), unspecified laterality 05/05/2021   Hyperlipidemia associated with type 2 diabetes mellitus ([place]) 02/01/2021   Depression 02/01/2021   Localized osteoporosis without current pathological fracture 05/01/2020   Hyperparathyroidism ([place]) 12/13/2019   Elevated hemoglobin ([facility]) 12/13/2019   Chronic renal impairment, stage 4 (severe) ([place]) 10/28/2019   Anemia 10/28/2019   Physical exam  06/26/2019   Neck pain 04/18/2019   Ascending aortic aneurysm ([place]) 04/17/2019   Hypoxia    Nonspecific chest pain 04/15/2019   Neuropathic pain of right flank 12/27/2018   Dysphagia 12/27/2018   Postsurgical hypothyroidism 06/11/2018   Vitamin D deficiency 06/11/2018   Multinodular goiter 06/11/2018   Hypercalcemia 06/11/2018   DM type 2 causing vascular disease ([facility])    PONV (postoperative nausea and vomiting)    Peripheral neuropathy    Migraine    Irritable bowel syndrome    Hypertension    History of right mastectomy    H/O hiatal hernia    GERD (gastroesophageal reflux disease)    Family history of anesthesia complication    Other chronic pain    Type 2 diabetes mellitus with diabetic chronic kidney disease ([place])    Lower extremity edema    Chronic constipation 06/23/2015   Chronic diastolic heart failure ([place]) 11/17/2014   Herniated nucleus pulposus, L2-3 right 02/04/2014    Class: Acute   Spondylolisthesis of lumbar region 04/30/2012    Class: Chronic   Lumbar stenosis with neurogenic claudication 04/30/2012    Class: Chronic   SYNCOPE-CAROTID SINUS 09/16/2008   OSTEOARTHRITIS 08/23/2006   DDD (degenerative disc disease), cervical 08/23/2006   DISC DISEASE, LUMBAR 08/23/2006   Headache(784.0) 08/23/2006   BREAST CANCER, HX OF 08/23/2006   HYPERPARATHYROIDISM, HX OF 08/23/2006   RENAL CALCULUS, HX OF 08/23/2006    REFERRING DIAG: OTHER spondylosis with myelopathy ,lumbar region    THERAPY DIAG:  Other low back pain  Muscle weakness (generalized)  Other abnormalities of gait and mobility  Rationale for Evaluation and Treatment Rehabilitation  PERTINENT HISTORY: Hx of lumbar fusions, CKD, Breast cancer, Cardiomyopathy, DM II, R TKA   PRECAUTIONS: None   SUBJECTIVE:  SUBJECTIVE  STATEMENT:  Pt presents to PT with reports of continued back pain. Has been compliant with HEP with no adverse effect. Pt is ready to begin PT at this time.    PAIN:  Are you having pain?  Yes: NPRS scale: 7/10 Worst: 7/10 Pain location: lower back, R LE Pain description: dull ache, N/T Aggravating factors: prolonged standing, walking Relieving factors: rest   OBJECTIVE: (objective measures completed at initial evaluation unless otherwise dated)  DIAGNOSTIC FINDINGS:  See imaging   PATIENT SURVEYS:  FOTO: 36% function; 43% predicted - 04/21/2022   COGNITION: Overall cognitive status: Within functional limits for tasks assessed               SENSATION: Light touch: Impaired - decreased light touch R LE   POSTURE: rounded shoulders, forward head, and increased lumbar lordosis   PALPATION: TTP to R posterior hip musculature    LUMBAR ROM:    AROM eval  Flexion 50% reduced  Extension 50% reduced  Right lateral flexion    Left lateral flexion    Right rotation    Left rotation     (Blank rows = not tested)   LOWER EXTREMITY MMT:     MMT Right eval Left eval  Hip flexion 3/5 3+/5  Hip extension      Hip abduction 3/5 3+/5  Hip adduction 3/5 3+/5  Hip internal rotation      Hip external rotation      Knee flexion 3+/5 4/5  Knee extension 3+/5 4/5  Ankle dorsiflexion      Ankle plantarflexion      Ankle inversion      Ankle eversion       (Blank rows = not tested)   LUMBAR SPECIAL TESTS:  Straight leg raise test: Positive and Slump test: Positive   FUNCTIONAL TESTS:  30 Second Sit to Stand: 4 reps - 04/21/2022   GAIT: Distance walked: 75f Assistive device utilized: Single point cane Level of assistance: Modified independence Comments: slowed, antalgic gait on R LE   TREATMENT: [facility] Adult PT Treatment:                                                DATE: 04/21/2022 Therapeutic Exercise: NuStep lvl 5 UE/LE x 5 min while taking subjective  LAQ 2x15  3# Seated march 2x20 3# Seated clamshell 2x15 BTB Lateral walk at counter x 2 laps Standing hip abd 2x10 Therapeutic Activity: Assessment of tests/measures, goals, and outcomes  [facility] Adult PT Treatment:                                                DATE: 04/14/2022 Therapeutic Exercise: NuStep lvl 5 UE/LE x 5 min while taking subjective  Lateral walk YTB at counter x 2 laps Standing hip abd/ext 2x10 YTB LAQ 2x15 3# Seated ball squeeze 2x15 - 3" hold Seated sciatic nerve glide x 10 each Seated hamstring curl 2x10 BTB LTR x 10 each Supine [name] x 10 - 5" hold Supine march x 20 Bridge x 10 (small range)  Supine SLR 2x10 each STS 2x10 - high table  [facility] Adult PT Treatment:  DATE: 04/12/2022 Therapeutic Exercise: NuStep lvl 5 UE/LE x 5 min while taking subjective  Standing mini squat x 10 Lateral walk YTB at counter x 2 laps Standing hip abd 2x10 YTB LAQ 2x10 3# Seated march 2x20 BTB Seated sciatic nerve glide x 10 each Seated ball squeeze 2x15 - 5" hold Seated clamshell 3x15 Black TB LTR x 10 each Seated hamstring curl 2x10 BTB Bridge x 10 (small range)  Supine SLR 2x10 each  [facility] Adult PT Treatment:                                                DATE: 03/31/2022 Therapeutic Exercise: NuStep lvl 5 UE/LE x 4 min while taking subjective  Seated sciatic nerve glide x 10 each Seated ball squeeze 3x10 - 5" hold Seated clamshell 3x15 BTB Seated march 2x20 BTB Standing hip abd 2x10 YTB Lateral walk YTB at counter x 2 laps LTR x 10 each LAQ 2x10 2.5# Seated hamstring curl 2x10 BTB Bridge 2x10 (small range)  Supine SLR 2x10 each   PATIENT EDUCATION:  Education details: eval findings, FOTO, HEP, POC Person educated: Patient Education method: Explanation, Demonstration, and Handouts Education comprehension: verbalized understanding and returned demonstration   HOME EXERCISE PROGRAM: Access Code: [email or web address] URL:  [email or web address] Date: 03/01/2022 Prepared by: Octavio Manns   Exercises - Seated Sciatic Tensioner  - 1 x daily - 7 x weekly - 2 sets - 10 reps - Active Straight Leg Raise with Quad Set  - 1 x daily - 7 x weekly - 2 sets - 10 reps - Supine Bridge  - 1 x daily - 7 x weekly - 2 sets - 10 reps - Seated Hip Abduction with Resistance  - 1 x daily - 7 x weekly - 3 sets - 15 reps - Supine Lower Trunk Rotation  - 1 x daily - 7 x weekly - 2 sets - 10 reps   ASSESSMENT:   CLINICAL IMPRESSION: Pt was able to complete prescribed exercises with no adverse effect. Over the course of PT treatment she has had variable progress, noting up/down pain and continued severe discomfort when it is higher. Pt did have slight increase in subjective functional ability assessed via FOTO. PT will continue to work on proximal hip and core strengthening over next few weeks to see if we can decrease pain and improve function. Will continue to progress as able per POC.    OBJECTIVE IMPAIRMENTS: decreased activity tolerance, decreased balance, decreased endurance, decreased mobility, difficulty walking, decreased ROM, decreased strength, and pain.    ACTIVITY LIMITATIONS: carrying, lifting, bending, standing, squatting, stairs, transfers, and locomotion level   PARTICIPATION LIMITATIONS: meal prep, cleaning, driving, shopping, community activity, occupation, and yard work   PERSONAL FACTORS: Fitness, Time since onset of injury/illness/exacerbation, and 1-2 comorbidities: CKD, Breast cancer, Cardiomyopathy, DM II  are also affecting patient's functional outcome.      GOALS: Goals reviewed with patient? No   SHORT TERM GOALS: Target date: 03/22/2022   Pt will be compliant and knowledgeable with initial HEP for improved comfort and carryover Baseline: initial HEP given  Goal status: MET   2.  Pt will self report lower back pain no greater than 5/10 for improved comfort and functional  ability Baseline: 7/10 at worst Goal status:ONGOING   LONG TERM GOALS: Target date: 05/26/2022   Pt will self report lower back  pain no greater than 2/10 for improved comfort and functional ability Baseline: 7/10 at worst Goal status: ONGOING   2.  Pt will improve FOTO function score to no less than 43% as proxy for functional improvement Baseline: 34% function 04/21/2022: 36% function Goal status: ONGOING    3.  Pt will increase 30 Second Sit to Stand rep count to no less than 7 reps (MCID 2) for improved balance, strength, and functional mobility Baseline: 5 reps  04/21/2022: 4 reps Goal status: ONGOING    4.  Pt will increase standing tolerance to no less than 20 minutes without pain for improved functional ability to perform home ADLs Baseline: 10 minutes Goal status: ONGOING   PLAN:   PT FREQUENCY: 2x/week   PT DURATION: 8 weeks   PLANNED INTERVENTIONS: Therapeutic exercises, Therapeutic activity, Neuromuscular re-education, Balance training, Gait training, Patient/Family education, Self Care, Joint mobilization, Aquatic Therapy, Dry Needling, Electrical stimulation, Cryotherapy, Moist heat, Manual therapy, and Re-evaluation.   PLAN FOR NEXT SESSION: assess HEP response, progress core and proximal hip strength, functional activity tolerance   Ward Chatters, PT 04/21/2022, 5:17 PM

## 2022-04-21 ENCOUNTER — Ambulatory Visit: Payer: Medicare HMO

## 2022-04-21 DIAGNOSIS — M6281 Muscle weakness (generalized): Secondary | ICD-10-CM

## 2022-04-21 DIAGNOSIS — R2689 Other abnormalities of gait and mobility: Secondary | ICD-10-CM

## 2022-04-21 DIAGNOSIS — M5459 Other low back pain: Secondary | ICD-10-CM | POA: Diagnosis not present

## 2022-04-26 ENCOUNTER — Ambulatory Visit: Payer: Medicare HMO

## 2022-04-26 DIAGNOSIS — M6281 Muscle weakness (generalized): Secondary | ICD-10-CM

## 2022-04-26 DIAGNOSIS — M5459 Other low back pain: Secondary | ICD-10-CM

## 2022-04-26 DIAGNOSIS — R2689 Other abnormalities of gait and mobility: Secondary | ICD-10-CM | POA: Diagnosis not present

## 2022-04-26 NOTE — Addendum Note (Signed)
Addended by: Ward Chatters on: 04/26/2022 02:39 PM   Modules accepted: Orders

## 2022-04-26 NOTE — Therapy (Signed)
OUTPATIENT PHYSICAL THERAPY TREATMENT NOTE     Patient Name: [name] MRN: [id number] DOB:01-Jun-1950, 72 y.o., female Today's Date: 04/26/2022  PCP: [name], MD  REFERRING PROVIDER: Arne Cleveland. Heagen, PA-C   END OF SESSION:   PT End of Session - 04/26/22 1434     Visit Number 11    Number of Visits 17    Date for PT Re-Evaluation 05/26/22    Authorization Type [employer] MCR    PT Start Time 7322    PT Stop Time 1525    PT Time Calculation (min) 40 min    Activity Tolerance Patient tolerated treatment well;Patient limited by pain    Behavior During Therapy [facility] for tasks assessed/performed              Past Medical History:  Diagnosis Date   Anginal pain ([place]) 03/19/2012   saw Dr. Cathie Olden .. she thinks its more related to stomach issues   Arthritis    "all over; mostly in my back" (03/20/2012)   Breast cancer ([place])    "right" (03/20/2012)   Cardiomyopathy    Chest pain 06/2005   Hospitalized, dystolic dysfunction   Chronic lower back pain    "have 2 herniated discs; going to have to have a fusion" (03/20/2012)   CKD (chronic kidney disease), stage III ([place])    Family history of anesthesia complication    "father has nausea and vomiting"   Gallstones    GERD (gastroesophageal reflux disease)    H/O hiatal hernia    History of kidney stones    History of right mastectomy    Hypertension    "patient states never had HTN, takes Hyzaar for heart   Hypothyroidism    Irritable bowel syndrome    Migraines    "it's been a long time since I've had one" (03/20/2012)   Osteoarthritis    Peripheral neuropathy    PONV (postoperative nausea and vomiting)    Sciatica    Type II diabetes mellitus ([place])    Past Surgical History:  Procedure Laterality Date   ABDOMINAL HYSTERECTOMY  1993   adenosin cardiolite  07/2005   (+) wall motion abnormality   AXILLARY LYMPH NODE DISSECTION  2003   squamous cell cancer   BACK SURGERY     CARDIAC CATHETERIZATION   ? 2005 / 2007   CHOLECYSTECTOMY N/A 01/24/2018   Procedure: LAPAROSCOPIC CHOLECYSTECTOMY;  Surgeon: Erroll Luna, MD;  Location: [place] [facility];  Service: General;  Laterality: N/A;   CT abdomen and pelvis  02/2010   Same   ESOPHAGOGASTRODUODENOSCOPY  2005   GERD   KNEE ARTHROSCOPY  1990's   "right" (03/20/2012)   LUMBAR FUSION N/A 08/22/2014   Procedure: Right L2-3 and right L1-2 transforaminal lumbar interbody fusion with pedicle screws, rods, sleeves, cages, local bone graft, Vivigen, cancellous chips;  Surgeon: Jessy Oto, MD;  Location: [place];  Service: Orthopedics;  Laterality: N/A;   LUMBAR LAMINECTOMY/DECOMPRESSION MICRODISCECTOMY N/A 02/04/2014   Procedure: RIGHT L2-3 MICRODISCECTOMY ;  Surgeon: Jessy Oto, MD;  Location: [place];  Service: Orthopedics;  Laterality: N/A;   MASTECTOMY Right    MASTECTOMY WITH AXILLARY LYMPH NODE DISSECTION  12/2003   "right" (03/20/2012)   POSTERIOR CERVICAL FUSION/FORAMINOTOMY  2001   SHOULDER ARTHROSCOPY W/ ROTATOR CUFF REPAIR  2003   Left   THYROIDECTOMY  1977   Right   TOTAL KNEE ARTHROPLASTY  2000   Right   [place] of abdomen  07/2005   Fatty  liver / kidney stones   Patient Active Problem List   Diagnosis Date Noted   Positive screening for depression on 9-item Patient Health Questionnaire (PHQ-9) 03/07/2022   Hammer toe of second toe of left foot 05/05/2021   Corns and callosities 05/05/2021   Hav (hallux abducto valgus), unspecified laterality 05/05/2021   Hyperlipidemia associated with type 2 diabetes mellitus ([place]) 02/01/2021   Depression 02/01/2021   Localized osteoporosis without current pathological fracture 05/01/2020   Hyperparathyroidism ([place]) 12/13/2019   Elevated hemoglobin ([place]) 12/13/2019   Chronic renal impairment, stage 4 (severe) ([place]) 10/28/2019   Anemia 10/28/2019   Physical exam 06/26/2019   Neck pain 04/18/2019   Ascending aortic aneurysm ([place]) 04/17/2019   Hypoxia    Nonspecific chest pain 04/15/2019    Neuropathic pain of right flank 12/27/2018   Dysphagia 12/27/2018   Postsurgical hypothyroidism 06/11/2018   Vitamin D deficiency 06/11/2018   Multinodular goiter 06/11/2018   Hypercalcemia 06/11/2018   DM type 2 causing vascular disease ([facility])    PONV (postoperative nausea and vomiting)    Peripheral neuropathy    Migraine    Irritable bowel syndrome    Hypertension    History of right mastectomy    H/O hiatal hernia    GERD (gastroesophageal reflux disease)    Family history of anesthesia complication    Other chronic pain    Type 2 diabetes mellitus with diabetic chronic kidney disease ([place])    Lower extremity edema    Chronic constipation 06/23/2015   Chronic diastolic heart failure ([place]) 11/17/2014   Herniated nucleus pulposus, L2-3 right 02/04/2014    Class: Acute   Spondylolisthesis of lumbar region 04/30/2012    Class: Chronic   Lumbar stenosis with neurogenic claudication 04/30/2012    Class: Chronic   SYNCOPE-CAROTID SINUS 09/16/2008   OSTEOARTHRITIS 08/23/2006   DDD (degenerative disc disease), cervical 08/23/2006   DISC DISEASE, LUMBAR 08/23/2006   Headache(784.0) 08/23/2006   BREAST CANCER, HX OF 08/23/2006   HYPERPARATHYROIDISM, HX OF 08/23/2006   RENAL CALCULUS, HX OF 08/23/2006    REFERRING DIAG: OTHER spondylosis with myelopathy ,lumbar region    THERAPY DIAG:  Other low back pain  Muscle weakness (generalized)  Other abnormalities of gait and mobility  Rationale for Evaluation and Treatment Rehabilitation  PERTINENT HISTORY: Hx of lumbar fusions, CKD, Breast cancer, Cardiomyopathy, DM II, R TKA   PRECAUTIONS: None   SUBJECTIVE:                                                                                                                                                                                      SUBJECTIVE STATEMENT: Patient reports continued pain midline  lower back.   PAIN:  Are you having pain?  Yes: NPRS scale: 9/10 Worst:  9/10 Pain location: lower back, R LE Pain description: dull ache, N/T Aggravating factors: prolonged standing, walking Relieving factors: rest   OBJECTIVE: (objective measures completed at initial evaluation unless otherwise dated)  DIAGNOSTIC FINDINGS:  See imaging   PATIENT SURVEYS:  FOTO: 36% function; 43% predicted - 04/21/2022   COGNITION: Overall cognitive status: Within functional limits for tasks assessed               SENSATION: Light touch: Impaired - decreased light touch R LE   POSTURE: rounded shoulders, forward head, and increased lumbar lordosis   PALPATION: TTP to R posterior hip musculature    LUMBAR ROM:    AROM eval  Flexion 50% reduced  Extension 50% reduced  Right lateral flexion    Left lateral flexion    Right rotation    Left rotation     (Blank rows = not tested)   LOWER EXTREMITY MMT:     MMT Right eval Left eval  Hip flexion 3/5 3+/5  Hip extension      Hip abduction 3/5 3+/5  Hip adduction 3/5 3+/5  Hip internal rotation      Hip external rotation      Knee flexion 3+/5 4/5  Knee extension 3+/5 4/5  Ankle dorsiflexion      Ankle plantarflexion      Ankle inversion      Ankle eversion       (Blank rows = not tested)   LUMBAR SPECIAL TESTS:  Straight leg raise test: Positive and Slump test: Positive   FUNCTIONAL TESTS:  30 Second Sit to Stand: 4 reps - 04/21/2022   GAIT: Distance walked: 57f Assistive device utilized: Single point cane Level of assistance: Modified independence Comments: slowed, antalgic gait on R LE   TREATMENT: [facility] Adult PT Treatment:                                                DATE: 04/26/22 Therapeutic Exercise: NuStep lvl 5 UE/LE x 5 min while taking subjective  LAQ 3x10 3# Seated march 3x20 3# Seated clamshell 3x10 BTB Lateral walk at counter x 2 laps  Standing hip abd 2x10 BIL LTR x 10 each Supine [name] 2 x 10 - 5" hold Supine march BlueTB 3x20 Supine SLR 2x10 each STS 2x10 - low table  sitting on Airex (2nd set with OH reach)   [facility]                                                [facility]: [facility] Therapeutic Exercise: NuStep lvl 5 UE/LE x 5 min while taking subjective  LAQ 2x15 3# Seated march 2x20 3# Seated clamshell 2x15 BTB Lateral walk at counter x 2 laps Standing hip abd 2x10 Therapeutic Activity: Assessment of tests/measures, goals, and outcomes  [facility] Adult PT Treatment:                                                DATE: 04/14/2022 Therapeutic  Exercise: NuStep lvl 5 UE/LE x 5 min while taking subjective  Lateral walk YTB at counter x 2 laps Standing hip abd/ext 2x10 YTB LAQ 2x15 3# Seated ball squeeze 2x15 - 3" hold Seated sciatic nerve glide x 10 each Seated hamstring curl 2x10 BTB LTR x 10 each Supine [name] x 10 - 5" hold Supine march x 20 Bridge x 10 (small range)  Supine SLR 2x10 each STS 2x10 - high table   PATIENT EDUCATION:  Education details: eval findings, FOTO, HEP, POC Person educated: Patient Education method: Explanation, Demonstration, and Handouts Education comprehension: verbalized understanding and returned demonstration   HOME EXERCISE PROGRAM: Access Code: [email or web address] URL: [email or web address] Date: 03/01/2022 Prepared by: Octavio Manns   Exercises - Seated Sciatic Tensioner  - 1 x daily - 7 x weekly - 2 sets - 10 reps - Active Straight Leg Raise with Quad Set  - 1 x daily - 7 x weekly - 2 sets - 10 reps - Supine Bridge  - 1 x daily - 7 x weekly - 2 sets - 10 reps - Seated Hip Abduction with Resistance  - 1 x daily - 7 x weekly - 3 sets - 15 reps - Supine Lower Trunk Rotation  - 1 x daily - 7 x weekly - 2 sets - 10 reps   ASSESSMENT:   CLINICAL IMPRESSION: Patient presents to PT with continued low back pain and reports HEP compliance. Session today focused on proximal hip and core strengthening. She is somewhat limited by pain throughout session, requesting to not perform bridges today as they  increase her pain. Patient continues to benefit from skilled PT services and should be progressed as able to improve functional independence.     OBJECTIVE IMPAIRMENTS: decreased activity tolerance, decreased balance, decreased endurance, decreased mobility, difficulty walking, decreased ROM, decreased strength, and pain.    ACTIVITY LIMITATIONS: carrying, lifting, bending, standing, squatting, stairs, transfers, and locomotion level   PARTICIPATION LIMITATIONS: meal prep, cleaning, driving, shopping, community activity, occupation, and yard work   PERSONAL FACTORS: Fitness, Time since onset of injury/illness/exacerbation, and 1-2 comorbidities: CKD, Breast cancer, Cardiomyopathy, DM II  are also affecting patient's functional outcome.      GOALS: Goals reviewed with patient? No   SHORT TERM GOALS: Target date: 03/22/2022   Pt will be compliant and knowledgeable with initial HEP for improved comfort and carryover Baseline: initial HEP given  Goal status: MET   2.  Pt will self report lower back pain no greater than 5/10 for improved comfort and functional ability Baseline: 7/10 at worst Goal status:ONGOING   LONG TERM GOALS: Target date: 05/26/2022   Pt will self report lower back pain no greater than 2/10 for improved comfort and functional ability Baseline: 7/10 at worst Goal status: ONGOING   2.  Pt will improve FOTO function score to no less than 43% as proxy for functional improvement Baseline: 34% function 04/21/2022: 36% function Goal status: ONGOING    3.  Pt will increase 30 Second Sit to Stand rep count to no less than 7 reps (MCID 2) for improved balance, strength, and functional mobility Baseline: 5 reps  04/21/2022: 4 reps Goal status: ONGOING    4.  Pt will increase standing tolerance to no less than 20 minutes without pain for improved functional ability to perform home ADLs Baseline: 10 minutes Goal status: ONGOING   PLAN:   PT FREQUENCY: 2x/week   PT  DURATION: 8 weeks   PLANNED INTERVENTIONS: Therapeutic exercises, Therapeutic activity, Neuromuscular  re-education, Balance training, Gait training, Patient/Family education, Self Care, Joint mobilization, Aquatic Therapy, Dry Needling, Electrical stimulation, Cryotherapy, Moist heat, Manual therapy, and Re-evaluation.   PLAN FOR NEXT SESSION: assess HEP response, progress core and proximal hip strength, functional activity tolerance   Margarette Canada, PTA 04/26/2022, 3:24 PM

## 2022-04-28 ENCOUNTER — Ambulatory Visit: Payer: Medicare HMO

## 2022-04-28 DIAGNOSIS — R2689 Other abnormalities of gait and mobility: Secondary | ICD-10-CM

## 2022-04-28 DIAGNOSIS — M5459 Other low back pain: Secondary | ICD-10-CM

## 2022-04-28 DIAGNOSIS — M6281 Muscle weakness (generalized): Secondary | ICD-10-CM

## 2022-04-28 NOTE — Therapy (Signed)
OUTPATIENT PHYSICAL THERAPY TREATMENT NOTE     Patient Name: [name] MRN: [id number] DOB:1950/10/24, 72 y.o., female Today's Date: 04/28/2022  PCP: [name], MD  REFERRING PROVIDER: Arne Cleveland. Heagen, PA-C   END OF SESSION:   PT End of Session - 04/28/22 1437     Visit Number 12    Number of Visits 17    Date for PT Re-Evaluation 05/26/22    Authorization Type [employer] MCR    PT Start Time 7741    PT Stop Time 1528    PT Time Calculation (min) 43 min    Activity Tolerance Patient tolerated treatment well;Patient limited by pain    Behavior During Therapy [facility] for tasks assessed/performed               Past Medical History:  Diagnosis Date   Anginal pain ([place]) 03/19/2012   saw Dr. Cathie Olden .. she thinks its more related to stomach issues   Arthritis    "all over; mostly in my back" (03/20/2012)   Breast cancer ([place])    "right" (03/20/2012)   Cardiomyopathy    Chest pain 06/2005   Hospitalized, dystolic dysfunction   Chronic lower back pain    "have 2 herniated discs; going to have to have a fusion" (03/20/2012)   CKD (chronic kidney disease), stage III ([place])    Family history of anesthesia complication    "father has nausea and vomiting"   Gallstones    GERD (gastroesophageal reflux disease)    H/O hiatal hernia    History of kidney stones    History of right mastectomy    Hypertension    "patient states never had HTN, takes Hyzaar for heart   Hypothyroidism    Irritable bowel syndrome    Migraines    "it's been a long time since I've had one" (03/20/2012)   Osteoarthritis    Peripheral neuropathy    PONV (postoperative nausea and vomiting)    Sciatica    Type II diabetes mellitus ([place])    Past Surgical History:  Procedure Laterality Date   ABDOMINAL HYSTERECTOMY  1993   adenosin cardiolite  07/2005   (+) wall motion abnormality   AXILLARY LYMPH NODE DISSECTION  2003   squamous cell cancer   BACK SURGERY     CARDIAC  CATHETERIZATION  ? 2005 / 2007   CHOLECYSTECTOMY N/A 01/24/2018   Procedure: LAPAROSCOPIC CHOLECYSTECTOMY;  Surgeon: Erroll Luna, MD;  Location: [place] [facility];  Service: General;  Laterality: N/A;   CT abdomen and pelvis  02/2010   Same   ESOPHAGOGASTRODUODENOSCOPY  2005   GERD   KNEE ARTHROSCOPY  1990's   "right" (03/20/2012)   LUMBAR FUSION N/A 08/22/2014   Procedure: Right L2-3 and right L1-2 transforaminal lumbar interbody fusion with pedicle screws, rods, sleeves, cages, local bone graft, Vivigen, cancellous chips;  Surgeon: Jessy Oto, MD;  Location: [place];  Service: Orthopedics;  Laterality: N/A;   LUMBAR LAMINECTOMY/DECOMPRESSION MICRODISCECTOMY N/A 02/04/2014   Procedure: RIGHT L2-3 MICRODISCECTOMY ;  Surgeon: Jessy Oto, MD;  Location: [place];  Service: Orthopedics;  Laterality: N/A;   MASTECTOMY Right    MASTECTOMY WITH AXILLARY LYMPH NODE DISSECTION  12/2003   "right" (03/20/2012)   POSTERIOR CERVICAL FUSION/FORAMINOTOMY  2001   SHOULDER ARTHROSCOPY W/ ROTATOR CUFF REPAIR  2003   Left   THYROIDECTOMY  1977   Right   TOTAL KNEE ARTHROPLASTY  2000   Right   [place] of abdomen  07/2005  Fatty liver / kidney stones   Patient Active Problem List   Diagnosis Date Noted   Positive screening for depression on 9-item Patient Health Questionnaire (PHQ-9) 03/07/2022   Hammer toe of second toe of left foot 05/05/2021   Corns and callosities 05/05/2021   Hav (hallux abducto valgus), unspecified laterality 05/05/2021   Hyperlipidemia associated with type 2 diabetes mellitus ([place]) 02/01/2021   Depression 02/01/2021   Localized osteoporosis without current pathological fracture 05/01/2020   Hyperparathyroidism ([place]) 12/13/2019   Elevated hemoglobin ([place]) 12/13/2019   Chronic renal impairment, stage 4 (severe) ([place]) 10/28/2019   Anemia 10/28/2019   Physical exam 06/26/2019   Neck pain 04/18/2019   Ascending aortic aneurysm ([place]) 04/17/2019   Hypoxia    Nonspecific chest pain  04/15/2019   Neuropathic pain of right flank 12/27/2018   Dysphagia 12/27/2018   Postsurgical hypothyroidism 06/11/2018   Vitamin D deficiency 06/11/2018   Multinodular goiter 06/11/2018   Hypercalcemia 06/11/2018   DM type 2 causing vascular disease ([facility])    PONV (postoperative nausea and vomiting)    Peripheral neuropathy    Migraine    Irritable bowel syndrome    Hypertension    History of right mastectomy    H/O hiatal hernia    GERD (gastroesophageal reflux disease)    Family history of anesthesia complication    Other chronic pain    Type 2 diabetes mellitus with diabetic chronic kidney disease ([place])    Lower extremity edema    Chronic constipation 06/23/2015   Chronic diastolic heart failure ([place]) 11/17/2014   Herniated nucleus pulposus, L2-3 right 02/04/2014    Class: Acute   Spondylolisthesis of lumbar region 04/30/2012    Class: Chronic   Lumbar stenosis with neurogenic claudication 04/30/2012    Class: Chronic   SYNCOPE-CAROTID SINUS 09/16/2008   OSTEOARTHRITIS 08/23/2006   DDD (degenerative disc disease), cervical 08/23/2006   DISC DISEASE, LUMBAR 08/23/2006   Headache(784.0) 08/23/2006   BREAST CANCER, HX OF 08/23/2006   HYPERPARATHYROIDISM, HX OF 08/23/2006   RENAL CALCULUS, HX OF 08/23/2006    REFERRING DIAG: OTHER spondylosis with myelopathy ,lumbar region    THERAPY DIAG:  Other low back pain  Muscle weakness (generalized)  Other abnormalities of gait and mobility  Rationale for Evaluation and Treatment Rehabilitation  PERTINENT HISTORY: Hx of lumbar fusions, CKD, Breast cancer, Cardiomyopathy, DM II, R TKA   PRECAUTIONS: None   SUBJECTIVE:                                                                                                                                                                                      SUBJECTIVE STATEMENT: Pt presents to PT  with reports of continued severe LBP. Has been compliant with HEP. Ready to begin PT at  this time.    PAIN:  Are you having pain?  Yes: NPRS scale: 9/10 Worst: 9/10 Pain location: lower back, R LE Pain description: dull ache, N/T Aggravating factors: prolonged standing, walking Relieving factors: rest   OBJECTIVE: (objective measures completed at initial evaluation unless otherwise dated)  DIAGNOSTIC FINDINGS:  See imaging   PATIENT SURVEYS:  FOTO: 36% function; 43% predicted - 04/21/2022   COGNITION: Overall cognitive status: Within functional limits for tasks assessed               SENSATION: Light touch: Impaired - decreased light touch R LE   POSTURE: rounded shoulders, forward head, and increased lumbar lordosis   PALPATION: TTP to R posterior hip musculature    LUMBAR ROM:    AROM eval  Flexion 50% reduced  Extension 50% reduced  Right lateral flexion    Left lateral flexion    Right rotation    Left rotation     (Blank rows = not tested)   LOWER EXTREMITY MMT:     MMT Right eval Left eval  Hip flexion 3/5 3+/5  Hip extension      Hip abduction 3/5 3+/5  Hip adduction 3/5 3+/5  Hip internal rotation      Hip external rotation      Knee flexion 3+/5 4/5  Knee extension 3+/5 4/5  Ankle dorsiflexion      Ankle plantarflexion      Ankle inversion      Ankle eversion       (Blank rows = not tested)   LUMBAR SPECIAL TESTS:  Straight leg raise test: Positive and Slump test: Positive   FUNCTIONAL TESTS:  30 Second Sit to Stand: 4 reps - 04/21/2022   GAIT: Distance walked: 74f Assistive device utilized: Single point cane Level of assistance: Modified independence Comments: slowed, antalgic gait on R LE   TREATMENT: [facility] Adult PT Treatment:                                                DATE: 04/28/22 Therapeutic Exercise: NuStep lvl 5 UE/LE x 5 min while taking subjective  LAQ 3x10 4# Seated march 3x20 4# LTR x 10  Supine SLR 2x10  Supine clamshell 3x15 BTB Supine march 2x20 BTB Supine [name] x 10 -5" hold  Supine [name] 2x10 - 5"  hold  Lateral walk at counter x 2 laps  Standing hip abd/ext 2x10 BIL  [facility] Adult PT Treatment:                                                DATE: 04/26/22 Therapeutic Exercise: NuStep lvl 5 UE/LE x 5 min while taking subjective  LAQ 3x10 3# Seated march 3x20 3# Seated clamshell 3x10 BTB Lateral walk at counter x 2 laps  Standing hip abd 2x10 BIL LTR x 10 each Supine [name] 2 x 10 - 5" hold Supine march BlueTB 3x20 Supine SLR 2x10 each STS 2x10 - low table sitting on Airex (2nd set with OH reach)   [facility]  [facility]: [facility] Therapeutic Exercise: NuStep lvl 5 UE/LE x 5 min while taking subjective  LAQ 2x15 3# Seated march 2x20 3# Seated clamshell 2x15 BTB Lateral walk at counter x 2 laps Standing hip abd 2x10 Therapeutic Activity: Assessment of tests/measures, goals, and outcomes  [facility] Adult PT Treatment:                                                DATE: 04/14/2022 Therapeutic Exercise: NuStep lvl 5 UE/LE x 5 min while taking subjective  Lateral walk YTB at counter x 2 laps Standing hip abd/ext 2x10 YTB LAQ 2x15 3# Seated ball squeeze 2x15 - 3" hold Seated sciatic nerve glide x 10 each Seated hamstring curl 2x10 BTB LTR x 10 each Supine [name] x 10 - 5" hold Supine march x 20 Bridge x 10 (small range)  Supine SLR 2x10 each STS 2x10 - high table   PATIENT EDUCATION:  Education details: eval findings, FOTO, HEP, POC Person educated: Patient Education method: Explanation, Demonstration, and Handouts Education comprehension: verbalized understanding and returned demonstration   HOME EXERCISE PROGRAM: Access Code: [email or web address] URL: [email or web address] Date: 03/01/2022 Prepared by: Octavio Manns   Exercises - Seated Sciatic Tensioner  - 1 x daily - 7 x weekly - 2 sets - 10 reps - Active Straight Leg Raise with Quad Set  - 1 x daily - 7 x weekly - 2 sets - 10 reps - Supine Bridge  - 1 x daily -  7 x weekly - 2 sets - 10 reps - Seated Hip Abduction with Resistance  - 1 x daily - 7 x weekly - 3 sets - 15 reps - Supine Lower Trunk Rotation  - 1 x daily - 7 x weekly - 2 sets - 10 reps   ASSESSMENT:   CLINICAL IMPRESSION: Pt was able to complete prescribed exercises with no adverse effect. Therapy focused on improving LE and core strength in order to decrease pain and improve functional mobility. PT will assess LTGs at next session and determine next steps of POC.    OBJECTIVE IMPAIRMENTS: decreased activity tolerance, decreased balance, decreased endurance, decreased mobility, difficulty walking, decreased ROM, decreased strength, and pain.    ACTIVITY LIMITATIONS: carrying, lifting, bending, standing, squatting, stairs, transfers, and locomotion level   PARTICIPATION LIMITATIONS: meal prep, cleaning, driving, shopping, community activity, occupation, and yard work   PERSONAL FACTORS: Fitness, Time since onset of injury/illness/exacerbation, and 1-2 comorbidities: CKD, Breast cancer, Cardiomyopathy, DM II  are also affecting patient's functional outcome.      GOALS: Goals reviewed with patient? No   SHORT TERM GOALS: Target date: 03/22/2022   Pt will be compliant and knowledgeable with initial HEP for improved comfort and carryover Baseline: initial HEP given  Goal status: MET   2.  Pt will self report lower back pain no greater than 5/10 for improved comfort and functional ability Baseline: 7/10 at worst Goal status:ONGOING   LONG TERM GOALS: Target date: 05/26/2022   Pt will self report lower back pain no greater than 2/10 for improved comfort and functional ability Baseline: 7/10 at worst Goal status: ONGOING   2.  Pt will improve FOTO function score to no less than 43% as proxy for functional improvement Baseline: 34% function 04/21/2022: 36% function Goal status: ONGOING    3.  Pt will increase 30 Second Sit to Stand rep count  to no less than 7 reps (MCID 2) for  improved balance, strength, and functional mobility Baseline: 5 reps  04/21/2022: 4 reps Goal status: ONGOING    4.  Pt will increase standing tolerance to no less than 20 minutes without pain for improved functional ability to perform home ADLs Baseline: 10 minutes Goal status: ONGOING   PLAN:   PT FREQUENCY: 2x/week   PT DURATION: 8 weeks   PLANNED INTERVENTIONS: Therapeutic exercises, Therapeutic activity, Neuromuscular re-education, Balance training, Gait training, Patient/Family education, Self Care, Joint mobilization, Aquatic Therapy, Dry Needling, Electrical stimulation, Cryotherapy, Moist heat, Manual therapy, and Re-evaluation.   PLAN FOR NEXT SESSION: assess HEP response, progress core and proximal hip strength, functional activity tolerance   Ward Chatters, PT 04/28/2022, 3:29 PM

## 2022-05-03 ENCOUNTER — Ambulatory Visit: Payer: Medicare HMO

## 2022-05-03 DIAGNOSIS — R2689 Other abnormalities of gait and mobility: Secondary | ICD-10-CM | POA: Diagnosis not present

## 2022-05-03 DIAGNOSIS — M5459 Other low back pain: Secondary | ICD-10-CM

## 2022-05-03 DIAGNOSIS — M6281 Muscle weakness (generalized): Secondary | ICD-10-CM

## 2022-05-03 NOTE — Therapy (Signed)
OUTPATIENT PHYSICAL THERAPY TREATMENT NOTE     Patient Name: [name] MRN: [id number] DOB:Oct 28, 1950, 72 y.o., female Today's Date: 05/04/2022  PCP: [name], MD  REFERRING PROVIDER: Arne Cleveland. Heagen, PA-C   END OF SESSION:   PT End of Session - 05/03/22 1443     Visit Number 13    Number of Visits 17    Date for PT Re-Evaluation 05/26/22    Authorization Type [employer] MCR    PT Start Time 1447    PT Stop Time 1530    PT Time Calculation (min) 43 min    Activity Tolerance Patient tolerated treatment well;Patient limited by pain    Behavior During Therapy [facility] for tasks assessed/performed                Past Medical History:  Diagnosis Date   Anginal pain ([place]) 03/19/2012   saw Dr. Cathie Olden .. she thinks its more related to stomach issues   Arthritis    "all over; mostly in my back" (03/20/2012)   Breast cancer ([place])    "right" (03/20/2012)   Cardiomyopathy    Chest pain 06/2005   Hospitalized, dystolic dysfunction   Chronic lower back pain    "have 2 herniated discs; going to have to have a fusion" (03/20/2012)   CKD (chronic kidney disease), stage III ([place])    Family history of anesthesia complication    "father has nausea and vomiting"   Gallstones    GERD (gastroesophageal reflux disease)    H/O hiatal hernia    History of kidney stones    History of right mastectomy    Hypertension    "patient states never had HTN, takes Hyzaar for heart   Hypothyroidism    Irritable bowel syndrome    Migraines    "it's been a long time since I've had one" (03/20/2012)   Osteoarthritis    Peripheral neuropathy    PONV (postoperative nausea and vomiting)    Sciatica    Type II diabetes mellitus ([place])    Past Surgical History:  Procedure Laterality Date   ABDOMINAL HYSTERECTOMY  1993   adenosin cardiolite  07/2005   (+) wall motion abnormality   AXILLARY LYMPH NODE DISSECTION  2003   squamous cell cancer   BACK SURGERY     CARDIAC  CATHETERIZATION  ? 2005 / 2007   CHOLECYSTECTOMY N/A 01/24/2018   Procedure: LAPAROSCOPIC CHOLECYSTECTOMY;  Surgeon: Erroll Luna, MD;  Location: [place] [facility];  Service: General;  Laterality: N/A;   CT abdomen and pelvis  02/2010   Same   ESOPHAGOGASTRODUODENOSCOPY  2005   GERD   KNEE ARTHROSCOPY  1990's   "right" (03/20/2012)   LUMBAR FUSION N/A 08/22/2014   Procedure: Right L2-3 and right L1-2 transforaminal lumbar interbody fusion with pedicle screws, rods, sleeves, cages, local bone graft, Vivigen, cancellous chips;  Surgeon: Jessy Oto, MD;  Location: [place];  Service: Orthopedics;  Laterality: N/A;   LUMBAR LAMINECTOMY/DECOMPRESSION MICRODISCECTOMY N/A 02/04/2014   Procedure: RIGHT L2-3 MICRODISCECTOMY ;  Surgeon: Jessy Oto, MD;  Location: [facility];  Service: Orthopedics;  Laterality: N/A;   MASTECTOMY Right    MASTECTOMY WITH AXILLARY LYMPH NODE DISSECTION  12/2003   "right" (03/20/2012)   POSTERIOR CERVICAL FUSION/FORAMINOTOMY  2001   SHOULDER ARTHROSCOPY W/ ROTATOR CUFF REPAIR  2003   Left   THYROIDECTOMY  1977   Right   TOTAL KNEE ARTHROPLASTY  2000   Right   [place] of abdomen  07/2005  Fatty liver / kidney stones   Patient Active Problem List   Diagnosis Date Noted   Positive screening for depression on 9-item Patient Health Questionnaire (PHQ-9) 03/07/2022   Hammer toe of second toe of left foot 05/05/2021   Corns and callosities 05/05/2021   Hav (hallux abducto valgus), unspecified laterality 05/05/2021   Hyperlipidemia associated with type 2 diabetes mellitus ([place]) 02/01/2021   Depression 02/01/2021   Localized osteoporosis without current pathological fracture 05/01/2020   Hyperparathyroidism ([place]) 12/13/2019   Elevated hemoglobin ([place]) 12/13/2019   Chronic renal impairment, stage 4 (severe) ([place]) 10/28/2019   Anemia 10/28/2019   Physical exam 06/26/2019   Neck pain 04/18/2019   Ascending aortic aneurysm ([place]) 04/17/2019   Hypoxia    Nonspecific chest pain  04/15/2019   Neuropathic pain of right flank 12/27/2018   Dysphagia 12/27/2018   Postsurgical hypothyroidism 06/11/2018   Vitamin D deficiency 06/11/2018   Multinodular goiter 06/11/2018   Hypercalcemia 06/11/2018   DM type 2 causing vascular disease ([facility])    PONV (postoperative nausea and vomiting)    Peripheral neuropathy    Migraine    Irritable bowel syndrome    Hypertension    History of right mastectomy    H/O hiatal hernia    GERD (gastroesophageal reflux disease)    Family history of anesthesia complication    Other chronic pain    Type 2 diabetes mellitus with diabetic chronic kidney disease ([place])    Lower extremity edema    Chronic constipation 06/23/2015   Chronic diastolic heart failure ([place]) 11/17/2014   Herniated nucleus pulposus, L2-3 right 02/04/2014    Class: Acute   Spondylolisthesis of lumbar region 04/30/2012    Class: Chronic   Lumbar stenosis with neurogenic claudication 04/30/2012    Class: Chronic   SYNCOPE-CAROTID SINUS 09/16/2008   OSTEOARTHRITIS 08/23/2006   DDD (degenerative disc disease), cervical 08/23/2006   DISC DISEASE, LUMBAR 08/23/2006   Headache(784.0) 08/23/2006   BREAST CANCER, HX OF 08/23/2006   HYPERPARATHYROIDISM, HX OF 08/23/2006   RENAL CALCULUS, HX OF 08/23/2006    REFERRING DIAG: OTHER spondylosis with myelopathy ,lumbar region    THERAPY DIAG:  Other low back pain  Muscle weakness (generalized)  Other abnormalities of gait and mobility  Rationale for Evaluation and Treatment Rehabilitation  PERTINENT HISTORY: Hx of lumbar fusions, CKD, Breast cancer, Cardiomyopathy, DM II, R TKA   PRECAUTIONS: None   SUBJECTIVE:                                                                                                                                                                                      SUBJECTIVE STATEMENT: Pt presents to PT  with FWW for ambulation. Continues to have severe LBP. Is frustrated by lack of progress.  She is ready to begin PT at this time.  PAIN:  Are you having pain?  Yes: NPRS scale: 9/10 Worst: 9/10 Pain location: lower back, R LE Pain description: dull ache, N/T Aggravating factors: prolonged standing, walking Relieving factors: rest   OBJECTIVE: (objective measures completed at initial evaluation unless otherwise dated)  DIAGNOSTIC FINDINGS:  See imaging   PATIENT SURVEYS:  FOTO: 30% function; 43% predicted - 05/03/2022   COGNITION: Overall cognitive status: Within functional limits for tasks assessed               SENSATION: Light touch: Impaired - decreased light touch R LE   POSTURE: rounded shoulders, forward head, and increased lumbar lordosis   PALPATION: TTP to R posterior hip musculature    LUMBAR ROM:    AROM eval  Flexion 50% reduced  Extension 50% reduced  Right lateral flexion    Left lateral flexion    Right rotation    Left rotation     (Blank rows = not tested)   LOWER EXTREMITY MMT:     MMT Right eval Left eval  Hip flexion 3/5 3+/5  Hip extension      Hip abduction 3/5 3+/5  Hip adduction 3/5 3+/5  Hip internal rotation      Hip external rotation      Knee flexion 3+/5 4/5  Knee extension 3+/5 4/5  Ankle dorsiflexion      Ankle plantarflexion      Ankle inversion      Ankle eversion       (Blank rows = not tested)   LUMBAR SPECIAL TESTS:  Straight leg raise test: Positive and Slump test: Positive   FUNCTIONAL TESTS:  30 Second Sit to Stand: 6 reps - 05/03/2022   GAIT: Distance walked: 54f Assistive device utilized: Single point cane Level of assistance: Modified independence Comments: slowed, antalgic gait on R LE   TREATMENT: [facility] Adult PT Treatment:                                                DATE: 05/03/22 Therapeutic Exercise: LTR x 10 STS x 10 Manual Therapy: Assessment of tests/measures and outcomes Manual Therapy: STM to bilateral lumbar paraspinals, QL  [facility] Adult PT Treatment:                                                 DATE: 04/28/22 Therapeutic Exercise: NuStep lvl 5 UE/LE x 5 min while taking subjective  LAQ 3x10 4# Seated march 3x20 4# LTR x 10  Supine SLR 2x10  Supine clamshell 3x15 BTB Supine march 2x20 BTB Supine [name] x 10 -5" hold  Supine [name] 2x10 - 5" hold  Lateral walk at counter x 2 laps  Standing hip abd/ext 2x10 BIL  [facility] Adult PT Treatment:                                                DATE: 04/26/22 Therapeutic Exercise: NuStep lvl 5 UE/LE x 5  min while taking subjective  LAQ 3x10 3# Seated march 3x20 3# Seated clamshell 3x10 BTB Lateral walk at counter x 2 laps  Standing hip abd 2x10 BIL LTR x 10 each Supine [name] 2 x 10 - 5" hold Supine march BlueTB 3x20 Supine SLR 2x10 each STS 2x10 - low table sitting on Airex (2nd set with OH reach)   [facility] Adult PT Treatment:                                                DATE: 04/21/2022 Therapeutic Exercise: NuStep lvl 5 UE/LE x 5 min while taking subjective  LAQ 2x15 3# Seated march 2x20 3# Seated clamshell 2x15 BTB Lateral walk at counter x 2 laps Standing hip abd 2x10 Therapeutic Activity: Assessment of tests/measures, goals, and outcomes  [facility] Adult PT Treatment:                                                DATE: 04/14/2022 Therapeutic Exercise: NuStep lvl 5 UE/LE x 5 min while taking subjective  Lateral walk YTB at counter x 2 laps Standing hip abd/ext 2x10 YTB LAQ 2x15 3# Seated ball squeeze 2x15 - 3" hold Seated sciatic nerve glide x 10 each Seated hamstring curl 2x10 BTB LTR x 10 each Supine [name] x 10 - 5" hold Supine march x 20 Bridge x 10 (small range)  Supine SLR 2x10 each STS 2x10 - high table   PATIENT EDUCATION:  Education details: continue HEP Person educated: Patient Education method: Explanation, Demonstration, and Handouts Education comprehension: verbalized understanding and returned demonstration   HOME EXERCISE PROGRAM: Access Code: [email or web address] URL:  [email or web address] Date: 03/01/2022 Prepared by: Octavio Manns   Exercises - Seated Sciatic Tensioner  - 1 x daily - 7 x weekly - 2 sets - 10 reps - Active Straight Leg Raise with Quad Set  - 1 x daily - 7 x weekly - 2 sets - 10 reps - Supine Bridge  - 1 x daily - 7 x weekly - 2 sets - 10 reps - Seated Hip Abduction with Resistance  - 1 x daily - 7 x weekly - 3 sets - 15 reps - Supine Lower Trunk Rotation  - 1 x daily - 7 x weekly - 2 sets - 10 reps   ASSESSMENT:   CLINICAL IMPRESSION: Pt tolerated treatment fair but continues to be limited by severe pain. She did respond well to [facility] today, noting slight decrease in pain and muscle tension post session. PT will slightly change POC adding a few aquatic therapy sessions to see if this helps LBP. Will also continue with manual therapy interventions, although TPDN and ESTIM are contraindicated secondary to lumbar surgeries and spinal cord stimulator.     OBJECTIVE IMPAIRMENTS: decreased activity tolerance, decreased balance, decreased endurance, decreased mobility, difficulty walking, decreased ROM, decreased strength, and pain.    ACTIVITY LIMITATIONS: carrying, lifting, bending, standing, squatting, stairs, transfers, and locomotion level   PARTICIPATION LIMITATIONS: meal prep, cleaning, driving, shopping, community activity, occupation, and yard work   PERSONAL FACTORS: Fitness, Time since onset of injury/illness/exacerbation, and 1-2 comorbidities: CKD, Breast cancer, Cardiomyopathy, DM II  are also affecting patient's functional outcome.      GOALS: Goals reviewed with  patient? No   SHORT TERM GOALS: Target date: 03/22/2022   Pt will be compliant and knowledgeable with initial HEP for improved comfort and carryover Baseline: initial HEP given  Goal status: MET   2.  Pt will self report lower back pain no greater than 5/10 for improved comfort and functional ability Baseline: 7/10 at worst Goal status:ONGOING    LONG TERM GOALS: Target date: 05/26/2022   Pt will self report lower back pain no greater than 2/10 for improved comfort and functional ability Baseline: 7/10 at worst Goal status: ONGOING   2.  Pt will improve FOTO function score to no less than 43% as proxy for functional improvement Baseline: 34% function 04/21/2022: 36% function 05/03/2022: 30% function Goal status: ONGOING    3.  Pt will increase 30 Second Sit to Stand rep count to no less than 7 reps (MCID 2) for improved balance, strength, and functional mobility Baseline: 5 reps  04/21/2022: 4 reps 05/03/2022: 6 reps  Goal status: ONGOING    4.  Pt will increase standing tolerance to no less than 20 minutes without pain for improved functional ability to perform home ADLs Baseline: 10 minutes Goal status: ONGOING   PLAN:   PT FREQUENCY: 2x/week   PT DURATION: 8 weeks   PLANNED INTERVENTIONS: Therapeutic exercises, Therapeutic activity, Neuromuscular re-education, Balance training, Gait training, Patient/Family education, Self Care, Joint mobilization, Aquatic Therapy, Dry Needling, Electrical stimulation, Cryotherapy, Moist heat, Manual therapy, and Re-evaluation.   PLAN FOR NEXT SESSION: assess HEP response, progress core and proximal hip strength, functional activity tolerance   Ward Chatters, PT 05/04/2022, 8:57 AM

## 2022-05-05 ENCOUNTER — Ambulatory Visit: Payer: Medicare HMO

## 2022-05-05 DIAGNOSIS — M6281 Muscle weakness (generalized): Secondary | ICD-10-CM | POA: Diagnosis not present

## 2022-05-05 DIAGNOSIS — R2689 Other abnormalities of gait and mobility: Secondary | ICD-10-CM

## 2022-05-05 DIAGNOSIS — M5459 Other low back pain: Secondary | ICD-10-CM | POA: Diagnosis not present

## 2022-05-05 NOTE — Therapy (Signed)
OUTPATIENT PHYSICAL THERAPY TREATMENT NOTE     Patient Name: [name] MRN: [id number] DOB:1951-04-07, 72 y.o., female Today's Date: 05/05/2022  PCP: [name], MD  REFERRING PROVIDER: Arne Cleveland. Heagen, PA-C   END OF SESSION:   PT End of Session - 05/05/22 1438     Visit Number 14    Number of Visits 17    Date for PT Re-Evaluation 05/26/22    Authorization Type [employer] MCR    PT Start Time 2330    PT Stop Time 1525    PT Time Calculation (min) 40 min    Activity Tolerance Patient tolerated treatment well;Patient limited by pain    Behavior During Therapy [facility] for tasks assessed/performed                 Past Medical History:  Diagnosis Date   Anginal pain ([place]) 03/19/2012   saw Dr. Cathie Olden .. she thinks its more related to stomach issues   Arthritis    "all over; mostly in my back" (03/20/2012)   Breast cancer ([place])    "right" (03/20/2012)   Cardiomyopathy    Chest pain 06/2005   Hospitalized, dystolic dysfunction   Chronic lower back pain    "have 2 herniated discs; going to have to have a fusion" (03/20/2012)   CKD (chronic kidney disease), stage III ([place])    Family history of anesthesia complication    "father has nausea and vomiting"   Gallstones    GERD (gastroesophageal reflux disease)    H/O hiatal hernia    History of kidney stones    History of right mastectomy    Hypertension    "patient states never had HTN, takes Hyzaar for heart   Hypothyroidism    Irritable bowel syndrome    Migraines    "it's been a long time since I've had one" (03/20/2012)   Osteoarthritis    Peripheral neuropathy    PONV (postoperative nausea and vomiting)    Sciatica    Type II diabetes mellitus ([place])    Past Surgical History:  Procedure Laterality Date   ABDOMINAL HYSTERECTOMY  1993   adenosin cardiolite  07/2005   (+) wall motion abnormality   AXILLARY LYMPH NODE DISSECTION  2003   squamous cell cancer   BACK SURGERY     CARDIAC  CATHETERIZATION  ? 2005 / 2007   CHOLECYSTECTOMY N/A 01/24/2018   Procedure: LAPAROSCOPIC CHOLECYSTECTOMY;  Surgeon: Erroll Luna, MD;  Location: [facility];  Service: General;  Laterality: N/A;   CT abdomen and pelvis  02/2010   Same   ESOPHAGOGASTRODUODENOSCOPY  2005   GERD   KNEE ARTHROSCOPY  1990's   "right" (03/20/2012)   LUMBAR FUSION N/A 08/22/2014   Procedure: Right L2-3 and right L1-2 transforaminal lumbar interbody fusion with pedicle screws, rods, sleeves, cages, local bone graft, Vivigen, cancellous chips;  Surgeon: Jessy Oto, MD;  Location: [facility];  Service: Orthopedics;  Laterality: N/A;   LUMBAR LAMINECTOMY/DECOMPRESSION MICRODISCECTOMY N/A 02/04/2014   Procedure: RIGHT L2-3 MICRODISCECTOMY ;  Surgeon: Jessy Oto, MD;  Location: [facility];  Service: Orthopedics;  Laterality: N/A;   MASTECTOMY Right    MASTECTOMY WITH AXILLARY LYMPH NODE DISSECTION  12/2003   "right" (03/20/2012)   POSTERIOR CERVICAL FUSION/FORAMINOTOMY  2001   SHOULDER ARTHROSCOPY W/ ROTATOR CUFF REPAIR  2003   Left   THYROIDECTOMY  1977   Right   TOTAL KNEE ARTHROPLASTY  2000   Right   [place] of abdomen  07/2005  Fatty liver / kidney stones   Patient Active Problem List   Diagnosis Date Noted   Positive screening for depression on 9-item Patient Health Questionnaire (PHQ-9) 03/07/2022   Hammer toe of second toe of left foot 05/05/2021   Corns and callosities 05/05/2021   Hav (hallux abducto valgus), unspecified laterality 05/05/2021   Hyperlipidemia associated with type 2 diabetes mellitus ([place]) 02/01/2021   Depression 02/01/2021   Localized osteoporosis without current pathological fracture 05/01/2020   Hyperparathyroidism ([place]) 12/13/2019   Elevated hemoglobin ([place]) 12/13/2019   Chronic renal impairment, stage 4 (severe) ([place]) 10/28/2019   Anemia 10/28/2019   Physical exam 06/26/2019   Neck pain 04/18/2019   Ascending aortic aneurysm ([place]) 04/17/2019   Hypoxia    Nonspecific chest pain  04/15/2019   Neuropathic pain of right flank 12/27/2018   Dysphagia 12/27/2018   Postsurgical hypothyroidism 06/11/2018   Vitamin D deficiency 06/11/2018   Multinodular goiter 06/11/2018   Hypercalcemia 06/11/2018   DM type 2 causing vascular disease ([facility])    PONV (postoperative nausea and vomiting)    Peripheral neuropathy    Migraine    Irritable bowel syndrome    Hypertension    History of right mastectomy    H/O hiatal hernia    GERD (gastroesophageal reflux disease)    Family history of anesthesia complication    Other chronic pain    Type 2 diabetes mellitus with diabetic chronic kidney disease ([place])    Lower extremity edema    Chronic constipation 06/23/2015   Chronic diastolic heart failure ([place]) 11/17/2014   Herniated nucleus pulposus, L2-3 right 02/04/2014    Class: Acute   Spondylolisthesis of lumbar region 04/30/2012    Class: Chronic   Lumbar stenosis with neurogenic claudication 04/30/2012    Class: Chronic   SYNCOPE-CAROTID SINUS 09/16/2008   OSTEOARTHRITIS 08/23/2006   DDD (degenerative disc disease), cervical 08/23/2006   DISC DISEASE, LUMBAR 08/23/2006   Headache(784.0) 08/23/2006   BREAST CANCER, HX OF 08/23/2006   HYPERPARATHYROIDISM, HX OF 08/23/2006   RENAL CALCULUS, HX OF 08/23/2006    REFERRING DIAG: OTHER spondylosis with myelopathy ,lumbar region    THERAPY DIAG:  Other low back pain  Muscle weakness (generalized)  Other abnormalities of gait and mobility  Rationale for Evaluation and Treatment Rehabilitation  PERTINENT HISTORY: Hx of lumbar fusions, CKD, Breast cancer, Cardiomyopathy, DM II, R TKA   PRECAUTIONS: None   SUBJECTIVE:                                                                                                                                                                                      SUBJECTIVE STATEMENT: Pt presents to PT  with reports of decreased back pain. Has been compliant with HEP over last two days. Has  been compliant with HEP, ready to begin PT at this time.   PAIN:  Are you having pain?  Yes: NPRS scale: 9/10 Worst: 9/10 Pain location: lower back, R LE Pain description: dull ache, N/T Aggravating factors: prolonged standing, walking Relieving factors: rest   OBJECTIVE: (objective measures completed at initial evaluation unless otherwise dated)  DIAGNOSTIC FINDINGS:  See imaging   PATIENT SURVEYS:  FOTO: 30% function; 43% predicted - 05/03/2022   COGNITION: Overall cognitive status: Within functional limits for tasks assessed               SENSATION: Light touch: Impaired - decreased light touch R LE   POSTURE: rounded shoulders, forward head, and increased lumbar lordosis   PALPATION: TTP to R posterior hip musculature    LUMBAR ROM:    AROM eval  Flexion 50% reduced  Extension 50% reduced  Right lateral flexion    Left lateral flexion    Right rotation    Left rotation     (Blank rows = not tested)   LOWER EXTREMITY MMT:     MMT Right eval Left eval  Hip flexion 3/5 3+/5  Hip extension      Hip abduction 3/5 3+/5  Hip adduction 3/5 3+/5  Hip internal rotation      Hip external rotation      Knee flexion 3+/5 4/5  Knee extension 3+/5 4/5  Ankle dorsiflexion      Ankle plantarflexion      Ankle inversion      Ankle eversion       (Blank rows = not tested)   LUMBAR SPECIAL TESTS:  Straight leg raise test: Positive and Slump test: Positive   FUNCTIONAL TESTS:  30 Second Sit to Stand: 6 reps - 05/03/2022   GAIT: Distance walked: 38f Assistive device utilized: Single point cane Level of assistance: Modified independence Comments: slowed, antalgic gait on R LE   TREATMENT: [facility] Adult PT Treatment:                                                DATE: 05/05/22 Therapeutic Exercise: NuStep lvl 5 UE/LE x 5 min while taking subjective  STS 2x10 Seated clamshell 2x20 BTB Manual Therapy: STM to bilateral lumbar paraspinals, QL  [facility] Adult PT  Treatment:                                                DATE: 05/03/22 Therapeutic Exercise: LTR x 10 STS x 10 Therapeutic Activity: Assessment of tests/measures and outcomes Manual Therapy: STM to bilateral lumbar paraspinals, QL  [facility] Adult PT Treatment:                                                DATE: 04/28/22 Therapeutic Exercise: NuStep lvl 5 UE/LE x 5 min while taking subjective  LAQ 3x10 4# Seated march 3x20 4# LTR x 10  Supine SLR 2x10  Supine clamshell 3x15 BTB Supine march 2x20 BTB Supine [name] x 10 -  5" hold  Supine [name] 2x10 - 5" hold  Lateral walk at counter x 2 laps  Standing hip abd/ext 2x10 BIL  [facility] Adult PT Treatment:                                                DATE: 04/26/22 Therapeutic Exercise: NuStep lvl 5 UE/LE x 5 min while taking subjective  LAQ 3x10 3# Seated march 3x20 3# Seated clamshell 3x10 BTB Lateral walk at counter x 2 laps  Standing hip abd 2x10 BIL LTR x 10 each Supine [name] 2 x 10 - 5" hold Supine march BlueTB 3x20 Supine SLR 2x10 each STS 2x10 - low table sitting on Airex (2nd set with OH reach)   [facility] Adult PT Treatment:                                                DATE: 04/21/2022 Therapeutic Exercise: NuStep lvl 5 UE/LE x 5 min while taking subjective  LAQ 2x15 3# Seated march 2x20 3# Seated clamshell 2x15 BTB Lateral walk at counter x 2 laps Standing hip abd 2x10 Therapeutic Activity: Assessment of tests/measures, goals, and outcomes  [facility] Adult PT Treatment:                                                DATE: 04/14/2022 Therapeutic Exercise: NuStep lvl 5 UE/LE x 5 min while taking subjective  Lateral walk YTB at counter x 2 laps Standing hip abd/ext 2x10 YTB LAQ 2x15 3# Seated ball squeeze 2x15 - 3" hold Seated sciatic nerve glide x 10 each Seated hamstring curl 2x10 BTB LTR x 10 each Supine [name] x 10 - 5" hold Supine march x 20 Bridge x 10 (small range)  Supine SLR 2x10 each STS 2x10 - high table   PATIENT  EDUCATION:  Education details: continue HEP Person educated: Patient Education method: Explanation, Demonstration, and Handouts Education comprehension: verbalized understanding and returned demonstration   HOME EXERCISE PROGRAM: Access Code: [email or web address] URL: [email or web address] Date: 03/01/2022 Prepared by: Octavio Manns   Exercises - Seated Sciatic Tensioner  - 1 x daily - 7 x weekly - 2 sets - 10 reps - Active Straight Leg Raise with Quad Set  - 1 x daily - 7 x weekly - 2 sets - 10 reps - Supine Bridge  - 1 x daily - 7 x weekly - 2 sets - 10 reps - Seated Hip Abduction with Resistance  - 1 x daily - 7 x weekly - 3 sets - 15 reps - Supine Lower Trunk Rotation  - 1 x daily - 7 x weekly - 2 sets - 10 reps   ASSESSMENT:   CLINICAL IMPRESSION: Pt able to complete prescribed exercises and responded well to manual therapy. Continues to benefit from skilled PT services, will continue per POC with gym and upcoming aquatic sessions.    OBJECTIVE IMPAIRMENTS: decreased activity tolerance, decreased balance, decreased endurance, decreased mobility, difficulty walking, decreased ROM, decreased strength, and pain.    ACTIVITY LIMITATIONS: carrying, lifting, bending, standing, squatting, stairs, transfers, and locomotion level   PARTICIPATION LIMITATIONS:  meal prep, cleaning, driving, shopping, community activity, occupation, and yard work   PERSONAL FACTORS: Fitness, Time since onset of injury/illness/exacerbation, and 1-2 comorbidities: CKD, Breast cancer, Cardiomyopathy, DM II  are also affecting patient's functional outcome.      GOALS: Goals reviewed with patient? No   SHORT TERM GOALS: Target date: 03/22/2022   Pt will be compliant and knowledgeable with initial HEP for improved comfort and carryover Baseline: initial HEP given  Goal status: MET   2.  Pt will self report lower back pain no greater than 5/10 for improved comfort and functional ability Baseline: 7/10 at  worst Goal status:ONGOING   LONG TERM GOALS: Target date: 05/26/2022   Pt will self report lower back pain no greater than 2/10 for improved comfort and functional ability Baseline: 7/10 at worst Goal status: ONGOING   2.  Pt will improve FOTO function score to no less than 43% as proxy for functional improvement Baseline: 34% function 04/21/2022: 36% function 05/03/2022: 30% function Goal status: ONGOING    3.  Pt will increase 30 Second Sit to Stand rep count to no less than 7 reps (MCID 2) for improved balance, strength, and functional mobility Baseline: 5 reps  04/21/2022: 4 reps 05/03/2022: 6 reps  Goal status: ONGOING    4.  Pt will increase standing tolerance to no less than 20 minutes without pain for improved functional ability to perform home ADLs Baseline: 10 minutes Goal status: ONGOING   PLAN:   PT FREQUENCY: 2x/week   PT DURATION: 8 weeks   PLANNED INTERVENTIONS: Therapeutic exercises, Therapeutic activity, Neuromuscular re-education, Balance training, Gait training, Patient/Family education, Self Care, Joint mobilization, Aquatic Therapy, Dry Needling, Electrical stimulation, Cryotherapy, Moist heat, Manual therapy, and Re-evaluation.   PLAN FOR NEXT SESSION: assess HEP response, progress core and proximal hip strength, functional activity tolerance   Ward Chatters, PT 05/05/2022, 3:33 PM

## 2022-05-10 ENCOUNTER — Ambulatory Visit: Payer: Medicare HMO

## 2022-05-10 DIAGNOSIS — M6281 Muscle weakness (generalized): Secondary | ICD-10-CM

## 2022-05-10 DIAGNOSIS — R2689 Other abnormalities of gait and mobility: Secondary | ICD-10-CM | POA: Diagnosis not present

## 2022-05-10 DIAGNOSIS — M5459 Other low back pain: Secondary | ICD-10-CM | POA: Diagnosis not present

## 2022-05-10 NOTE — Patient Instructions (Signed)
Aquatic Therapy at [facility]-  What to Expect! ? ?Where:  ? ?[facility] ?[address] [facility] ?[place], [place] [address] ?Rehab phone [phone] ? ?NOTE:  You will receive an automated phone message reminding you of your appt and it will say the appointment is at the [address] [facility] [address] [facility] clinic. ? ?        ?How to Prepare: ?Please make sure you drink 8 ounces of water about one hour prior to your pool session ?A caregiver may attend if needed with the patient to help assist as needed. A caregiver can sit in the pool room on chair. ?Please arrive IN YOUR SUIT and 15 minutes prior to your appointment - this helps to avoid delays in starting your session. ?Please make sure to attend to any toileting needs prior to entering the pool ?Locker rooms for changing are provided.   There is direct access to the pool deck form the locker room.  You can lock your belongings in a locker with lock provided. ?Once on the pool deck your therapist will ask if you have signed the Patient  Consent and Assignment of Benefits form before beginning treatment ?Your therapist may take your blood pressure prior to, during and after your session if indicated ?We usually try and create a home exercise program based on activities we do in the pool.  Please be thinking about who might be able to assist you in the pool should you need to participate in an aquatic home exercise program at the time of discharge if you need assistance.  Some patients do not want to or do not have the ability to participate in an aquatic home program - this is not a barrier in any way to you participating in aquatic therapy as part of your current therapy plan! ?After Discharge from PT, you can continue using home program at  the [facility]/, there is a drop-in fee for $5 ($45 a month)or for 60 years  or older $4.00 ($40 a month for seniors ) or any local [employer] pool.  Memberships for purchase are  available for gym/pool at [facility] ? ?IT IS VERY IMPORTANT THAT YOUR LAST VISIT BE IN THE CLINIC AT [address] AFTER YOUR LAST AQUATIC VISIT.  PLEASE MAKE SURE THAT YOU HAVE A [address]  APPOINTMENT SCHEDULED. ? ? ?About the pool: ?Pool is located approximately 500 FT from the entrance of the building.  ?Please bring a support person if you need assistance traveling this      distance.  ? ?Your therapist will assist you in entering the water; there are two ways to     ?      enter: stairs with railings, and a mechanical lift. Your therapist will determine the most appropriate way for you. ? ?Water temperature is usually between 88-90 degrees ? ?There may be up to 2 other swimmers in the pool at the same time ? ?The pool deck is tile, please wear shoes with good traction if you prefer not to be barefoot.  ? ? ?Contact Info:  ?For appointment scheduling and cancellations:         Please call the [facility]  PH:[phone]              Aquatic Therapy  ?[facility]       All sessions are 45 minutes ? ? ? ? ?        ? ?                ?                      ?

## 2022-05-10 NOTE — Therapy (Signed)
OUTPATIENT PHYSICAL THERAPY TREATMENT NOTE     Patient Name: [name] MRN: [id number] DOB:Aug 17, 1950, 72 y.o., female Today's Date: 05/10/2022  PCP: [name], MD  REFERRING PROVIDER: Arne Cleveland. Heagen, PA-C   END OF SESSION:   PT End of Session - 05/10/22 1442     Visit Number 15    Number of Visits 17    Date for PT Re-Evaluation 05/26/22    Authorization Type [employer] MCR    PT Start Time 5361    PT Stop Time 1525    PT Time Calculation (min) 40 min    Activity Tolerance Patient tolerated treatment well;Patient limited by pain    Behavior During Therapy [facility] for tasks assessed/performed             Past Medical History:  Diagnosis Date   Anginal pain ([place]) 03/19/2012   saw Dr. Cathie Olden .. she thinks its more related to stomach issues   Arthritis    "all over; mostly in my back" (03/20/2012)   Breast cancer ([place])    "right" (03/20/2012)   Cardiomyopathy    Chest pain 06/2005   Hospitalized, dystolic dysfunction   Chronic lower back pain    "have 2 herniated discs; going to have to have a fusion" (03/20/2012)   CKD (chronic kidney disease), stage III ([place])    Family history of anesthesia complication    "father has nausea and vomiting"   Gallstones    GERD (gastroesophageal reflux disease)    H/O hiatal hernia    History of kidney stones    History of right mastectomy    Hypertension    "patient states never had HTN, takes Hyzaar for heart   Hypothyroidism    Irritable bowel syndrome    Migraines    "it's been a long time since I've had one" (03/20/2012)   Osteoarthritis    Peripheral neuropathy    PONV (postoperative nausea and vomiting)    Sciatica    Type II diabetes mellitus ([place])    Past Surgical History:  Procedure Laterality Date   ABDOMINAL HYSTERECTOMY  1993   adenosin cardiolite  07/2005   (+) wall motion abnormality   AXILLARY LYMPH NODE DISSECTION  2003   squamous cell cancer   BACK SURGERY     CARDIAC CATHETERIZATION   ? 2005 / 2007   CHOLECYSTECTOMY N/A 01/24/2018   Procedure: LAPAROSCOPIC CHOLECYSTECTOMY;  Surgeon: Erroll Luna, MD;  Location: [facility];  Service: General;  Laterality: N/A;   CT abdomen and pelvis  02/2010   Same   ESOPHAGOGASTRODUODENOSCOPY  2005   GERD   KNEE ARTHROSCOPY  1990's   "right" (03/20/2012)   LUMBAR FUSION N/A 08/22/2014   Procedure: Right L2-3 and right L1-2 transforaminal lumbar interbody fusion with pedicle screws, rods, sleeves, cages, local bone graft, Vivigen, cancellous chips;  Surgeon: Jessy Oto, MD;  Location: [place];  Service: Orthopedics;  Laterality: N/A;   LUMBAR LAMINECTOMY/DECOMPRESSION MICRODISCECTOMY N/A 02/04/2014   Procedure: RIGHT L2-3 MICRODISCECTOMY ;  Surgeon: Jessy Oto, MD;  Location: [place];  Service: Orthopedics;  Laterality: N/A;   MASTECTOMY Right    MASTECTOMY WITH AXILLARY LYMPH NODE DISSECTION  12/2003   "right" (03/20/2012)   POSTERIOR CERVICAL FUSION/FORAMINOTOMY  2001   SHOULDER ARTHROSCOPY W/ ROTATOR CUFF REPAIR  2003   Left   THYROIDECTOMY  1977   Right   TOTAL KNEE ARTHROPLASTY  2000   Right   [place] of abdomen  07/2005   Fatty liver /  kidney stones   Patient Active Problem List   Diagnosis Date Noted   Positive screening for depression on 9-item Patient Health Questionnaire (PHQ-9) 03/07/2022   Hammer toe of second toe of left foot 05/05/2021   Corns and callosities 05/05/2021   Hav (hallux abducto valgus), unspecified laterality 05/05/2021   Hyperlipidemia associated with type 2 diabetes mellitus ([place]) 02/01/2021   Depression 02/01/2021   Localized osteoporosis without current pathological fracture 05/01/2020   Hyperparathyroidism ([place]) 12/13/2019   Elevated hemoglobin ([place]) 12/13/2019   Chronic renal impairment, stage 4 (severe) ([place]) 10/28/2019   Anemia 10/28/2019   Physical exam 06/26/2019   Neck pain 04/18/2019   Ascending aortic aneurysm ([place]) 04/17/2019   Hypoxia    Nonspecific chest pain 04/15/2019    Neuropathic pain of right flank 12/27/2018   Dysphagia 12/27/2018   Postsurgical hypothyroidism 06/11/2018   Vitamin D deficiency 06/11/2018   Multinodular goiter 06/11/2018   Hypercalcemia 06/11/2018   DM type 2 causing vascular disease ([facility])    PONV (postoperative nausea and vomiting)    Peripheral neuropathy    Migraine    Irritable bowel syndrome    Hypertension    History of right mastectomy    H/O hiatal hernia    GERD (gastroesophageal reflux disease)    Family history of anesthesia complication    Other chronic pain    Type 2 diabetes mellitus with diabetic chronic kidney disease ([place])    Lower extremity edema    Chronic constipation 06/23/2015   Chronic diastolic heart failure ([place]) 11/17/2014   Herniated nucleus pulposus, L2-3 right 02/04/2014    Class: Acute   Spondylolisthesis of lumbar region 04/30/2012    Class: Chronic   Lumbar stenosis with neurogenic claudication 04/30/2012    Class: Chronic   SYNCOPE-CAROTID SINUS 09/16/2008   OSTEOARTHRITIS 08/23/2006   DDD (degenerative disc disease), cervical 08/23/2006   DISC DISEASE, LUMBAR 08/23/2006   Headache(784.0) 08/23/2006   BREAST CANCER, HX OF 08/23/2006   HYPERPARATHYROIDISM, HX OF 08/23/2006   RENAL CALCULUS, HX OF 08/23/2006    REFERRING DIAG: OTHER spondylosis with myelopathy ,lumbar region    THERAPY DIAG:  Other low back pain  Muscle weakness (generalized)  Other abnormalities of gait and mobility  Rationale for Evaluation and Treatment Rehabilitation  PERTINENT HISTORY: Hx of lumbar fusions, CKD, Breast cancer, Cardiomyopathy, DM II, R TKA   PRECAUTIONS: None   SUBJECTIVE:                                                                                                                                                                                      SUBJECTIVE STATEMENT: Patient reports that she hasn't been doing  her exercises as often as she should but that she has been walking around the  house more.  PAIN:  Are you having pain?  Yes: NPRS scale: 8/10 Worst: 9/10 Pain location: lower back, R LE Pain description: dull ache, N/T Aggravating factors: prolonged standing, walking Relieving factors: rest   OBJECTIVE: (objective measures completed at initial evaluation unless otherwise dated)  DIAGNOSTIC FINDINGS:  See imaging   PATIENT SURVEYS:  FOTO: 30% function; 43% predicted - 05/03/2022   COGNITION: Overall cognitive status: Within functional limits for tasks assessed               SENSATION: Light touch: Impaired - decreased light touch R LE   POSTURE: rounded shoulders, forward head, and increased lumbar lordosis   PALPATION: TTP to R posterior hip musculature    LUMBAR ROM:    AROM eval  Flexion 50% reduced  Extension 50% reduced  Right lateral flexion    Left lateral flexion    Right rotation    Left rotation     (Blank rows = not tested)   LOWER EXTREMITY MMT:     MMT Right eval Left eval  Hip flexion 3/5 3+/5  Hip extension      Hip abduction 3/5 3+/5  Hip adduction 3/5 3+/5  Hip internal rotation      Hip external rotation      Knee flexion 3+/5 4/5  Knee extension 3+/5 4/5  Ankle dorsiflexion      Ankle plantarflexion      Ankle inversion      Ankle eversion       (Blank rows = not tested)   LUMBAR SPECIAL TESTS:  Straight leg raise test: Positive and Slump test: Positive   FUNCTIONAL TESTS:  30 Second Sit to Stand: 6 reps - 05/03/2022   GAIT: Distance walked: 81f Assistive device utilized: Single point cane Level of assistance: Modified independence Comments: slowed, antalgic gait on R LE   TREATMENT: [facility] Adult PT Treatment:                                                DATE: 05/10/22 Therapeutic Exercise: NuStep lvl 5 UE/LE x 5 min while taking subjective  LAQ 3x10 4# Standing hip abd/ext 2x10 BIL Manual Therapy: STM to bilateral lumbar paraspinals, QL   [facility] Adult PT Treatment:                                                 DATE: 05/05/22 Therapeutic Exercise: NuStep lvl 5 UE/LE x 5 min while taking subjective  STS 2x10 Seated clamshell 2x20 BTB Manual Therapy: STM to bilateral lumbar paraspinals, QL  [facility] Adult PT Treatment:                                                DATE: 05/03/22 Therapeutic Exercise: LTR x 10 STS x 10 Therapeutic Activity: Assessment of tests/measures and outcomes Manual Therapy: STM to bilateral lumbar paraspinals, QL    PATIENT EDUCATION:  Education details: continue HEP Person educated: Patient Education method: Explanation, Demonstration, and Handouts Education comprehension:  verbalized understanding and returned demonstration   HOME EXERCISE PROGRAM: Access Code: [email or web address] URL: [email or web address] Date: 03/01/2022 Prepared by: Octavio Manns   Exercises - Seated Sciatic Tensioner  - 1 x daily - 7 x weekly - 2 sets - 10 reps - Active Straight Leg Raise with Quad Set  - 1 x daily - 7 x weekly - 2 sets - 10 reps - Supine Bridge  - 1 x daily - 7 x weekly - 2 sets - 10 reps - Seated Hip Abduction with Resistance  - 1 x daily - 7 x weekly - 3 sets - 15 reps - Supine Lower Trunk Rotation  - 1 x daily - 7 x weekly - 2 sets - 10 reps   ASSESSMENT:   CLINICAL IMPRESSION: Patient presents to PT reporting continued lower back pain. Session today focused on proximal hip strengthening as well as manual techniques to decrease tissue restriction. Patient continues to benefit from skilled PT services and should be progressed as able to improve functional independence.   OBJECTIVE IMPAIRMENTS: decreased activity tolerance, decreased balance, decreased endurance, decreased mobility, difficulty walking, decreased ROM, decreased strength, and pain.    ACTIVITY LIMITATIONS: carrying, lifting, bending, standing, squatting, stairs, transfers, and locomotion level   PARTICIPATION LIMITATIONS: meal prep, cleaning, driving, shopping, community activity,  occupation, and yard work   PERSONAL FACTORS: Fitness, Time since onset of injury/illness/exacerbation, and 1-2 comorbidities: CKD, Breast cancer, Cardiomyopathy, DM II  are also affecting patient's functional outcome.      GOALS: Goals reviewed with patient? No   SHORT TERM GOALS: Target date: 03/22/2022   Pt will be compliant and knowledgeable with initial HEP for improved comfort and carryover Baseline: initial HEP given  Goal status: MET   2.  Pt will self report lower back pain no greater than 5/10 for improved comfort and functional ability Baseline: 7/10 at worst Goal status:ONGOING   LONG TERM GOALS: Target date: 05/26/2022   Pt will self report lower back pain no greater than 2/10 for improved comfort and functional ability Baseline: 7/10 at worst Goal status: ONGOING   2.  Pt will improve FOTO function score to no less than 43% as proxy for functional improvement Baseline: 34% function 04/21/2022: 36% function 05/03/2022: 30% function Goal status: ONGOING    3.  Pt will increase 30 Second Sit to Stand rep count to no less than 7 reps (MCID 2) for improved balance, strength, and functional mobility Baseline: 5 reps  04/21/2022: 4 reps 05/03/2022: 6 reps  Goal status: ONGOING    4.  Pt will increase standing tolerance to no less than 20 minutes without pain for improved functional ability to perform home ADLs Baseline: 10 minutes Goal status: ONGOING   PLAN:   PT FREQUENCY: 2x/week   PT DURATION: 8 weeks   PLANNED INTERVENTIONS: Therapeutic exercises, Therapeutic activity, Neuromuscular re-education, Balance training, Gait training, Patient/Family education, Self Care, Joint mobilization, Aquatic Therapy, Dry Needling, Electrical stimulation, Cryotherapy, Moist heat, Manual therapy, and Re-evaluation.   PLAN FOR NEXT SESSION: assess HEP response, progress core and proximal hip strength, functional activity tolerance   Margarette Canada, PTA 05/10/2022, 3:26  PM

## 2022-05-12 ENCOUNTER — Ambulatory Visit: Payer: Medicare HMO

## 2022-05-12 ENCOUNTER — Other Ambulatory Visit: Payer: Self-pay | Admitting: Thoracic Surgery (Cardiothoracic Vascular Surgery)

## 2022-05-12 DIAGNOSIS — I7121 Aneurysm of the ascending aorta, without rupture: Secondary | ICD-10-CM

## 2022-05-12 NOTE — Therapy (Incomplete)
OUTPATIENT PHYSICAL THERAPY TREATMENT NOTE     Patient Name: [name] MRN: [id number] DOB:[birth year], 72 y.o., female Today's Date: 05/12/2022  PCP: [name], MD  REFERRING PROVIDER: Arne Cleveland. Heagen, PA-C   END OF SESSION:     Past Medical History:  Diagnosis Date   Anginal pain ([place]) 03/19/2012   saw Dr. Cathie Olden .. she thinks its more related to stomach issues   Arthritis    "all over; mostly in my back" (03/20/2012)   Breast cancer ([place])    "right" (03/20/2012)   Cardiomyopathy    Chest pain 06/2005   Hospitalized, dystolic dysfunction   Chronic lower back pain    "have 2 herniated discs; going to have to have a fusion" (03/20/2012)   CKD (chronic kidney disease), stage III ([place])    Family history of anesthesia complication    "father has nausea and vomiting"   Gallstones    GERD (gastroesophageal reflux disease)    H/O hiatal hernia    History of kidney stones    History of right mastectomy    Hypertension    "patient states never had HTN, takes Hyzaar for heart   Hypothyroidism    Irritable bowel syndrome    Migraines    "it's been a long time since I've had one" (03/20/2012)   Osteoarthritis    Peripheral neuropathy    PONV (postoperative nausea and vomiting)    Sciatica    Type II diabetes mellitus ([place])    Past Surgical History:  Procedure Laterality Date   ABDOMINAL HYSTERECTOMY  1993   adenosin cardiolite  07/2005   (+) wall motion abnormality   AXILLARY LYMPH NODE DISSECTION  2003   squamous cell cancer   BACK SURGERY     CARDIAC CATHETERIZATION  ? 2005 / 2007   CHOLECYSTECTOMY N/A 01/24/2018   Procedure: LAPAROSCOPIC CHOLECYSTECTOMY;  Surgeon: Erroll Luna, MD;  Location: [facility];  Service: General;  Laterality: N/A;   CT abdomen and pelvis  02/2010   Same   ESOPHAGOGASTRODUODENOSCOPY  2005   GERD   KNEE ARTHROSCOPY  1990's   "right" (03/20/2012)   LUMBAR FUSION N/A 08/22/2014   Procedure: Right L2-3 and right L1-2  transforaminal lumbar interbody fusion with pedicle screws, rods, sleeves, cages, local bone graft, Vivigen, cancellous chips;  Surgeon: Jessy Oto, MD;  Location: [facility];  Service: Orthopedics;  Laterality: N/A;   LUMBAR LAMINECTOMY/DECOMPRESSION MICRODISCECTOMY N/A 02/04/2014   Procedure: RIGHT L2-3 MICRODISCECTOMY ;  Surgeon: Jessy Oto, MD;  Location: [facility];  Service: Orthopedics;  Laterality: N/A;   MASTECTOMY Right    MASTECTOMY WITH AXILLARY LYMPH NODE DISSECTION  12/2003   "right" (03/20/2012)   POSTERIOR CERVICAL FUSION/FORAMINOTOMY  2001   SHOULDER ARTHROSCOPY W/ ROTATOR CUFF REPAIR  2003   Left   THYROIDECTOMY  1977   Right   TOTAL KNEE ARTHROPLASTY  2000   Right   [place] of abdomen  07/2005   Fatty liver / kidney stones   Patient Active Problem List   Diagnosis Date Noted   Positive screening for depression on 9-item Patient Health Questionnaire (PHQ-9) 03/07/2022   Hammer toe of second toe of left foot 05/05/2021   Corns and callosities 05/05/2021   Hav (hallux abducto valgus), unspecified laterality 05/05/2021   Hyperlipidemia associated with type 2 diabetes mellitus ([place]) 02/01/2021   Depression 02/01/2021   Localized osteoporosis without current pathological fracture 05/01/2020   Hyperparathyroidism ([place]) 12/13/2019   Elevated hemoglobin ([facility]) 12/13/2019   Chronic  renal impairment, stage 4 (severe) ([place]) 10/28/2019   Anemia 10/28/2019   Physical exam 06/26/2019   Neck pain 04/18/2019   Ascending aortic aneurysm ([place]) 04/17/2019   Hypoxia    Nonspecific chest pain 04/15/2019   Neuropathic pain of right flank 12/27/2018   Dysphagia 12/27/2018   Postsurgical hypothyroidism 06/11/2018   Vitamin D deficiency 06/11/2018   Multinodular goiter 06/11/2018   Hypercalcemia 06/11/2018   DM type 2 causing vascular disease ([facility])    PONV (postoperative nausea and vomiting)    Peripheral neuropathy    Migraine    Irritable bowel syndrome    Hypertension    History of  right mastectomy    H/O hiatal hernia    GERD (gastroesophageal reflux disease)    Family history of anesthesia complication    Other chronic pain    Type 2 diabetes mellitus with diabetic chronic kidney disease ([place])    Lower extremity edema    Chronic constipation 06/23/2015   Chronic diastolic heart failure ([place]) 11/17/2014   Herniated nucleus pulposus, L2-3 right 02/04/2014    Class: Acute   Spondylolisthesis of lumbar region 04/30/2012    Class: Chronic   Lumbar stenosis with neurogenic claudication 04/30/2012    Class: Chronic   SYNCOPE-CAROTID SINUS 09/16/2008   OSTEOARTHRITIS 08/23/2006   DDD (degenerative disc disease), cervical 08/23/2006   DISC DISEASE, LUMBAR 08/23/2006   Headache(784.0) 08/23/2006   BREAST CANCER, HX OF 08/23/2006   HYPERPARATHYROIDISM, HX OF 08/23/2006   RENAL CALCULUS, HX OF 08/23/2006    REFERRING DIAG: OTHER spondylosis with myelopathy ,lumbar region    THERAPY DIAG:  No diagnosis found.  Rationale for Evaluation and Treatment Rehabilitation  PERTINENT HISTORY: Hx of lumbar fusions, CKD, Breast cancer, Cardiomyopathy, DM II, R TKA   PRECAUTIONS: None   SUBJECTIVE:                                                                                                                                                                                      SUBJECTIVE STATEMENT: *** Patient reports that she hasn't been doing her exercises as often as she should but that she has been walking around the house more.  PAIN:  Are you having pain?  Yes: NPRS scale: *** 8/10 Worst: 9/10 Pain location: lower back, R LE Pain description: dull ache, N/T Aggravating factors: prolonged standing, walking Relieving factors: rest   OBJECTIVE: (objective measures completed at initial evaluation unless otherwise dated)  DIAGNOSTIC FINDINGS:  See imaging   PATIENT SURVEYS:  FOTO: 30% function; 43% predicted - 05/03/2022   COGNITION: Overall cognitive status:  Within functional limits for tasks assessed  SENSATION: Light touch: Impaired - decreased light touch R LE   POSTURE: rounded shoulders, forward head, and increased lumbar lordosis   PALPATION: TTP to R posterior hip musculature    LUMBAR ROM:    AROM eval  Flexion 50% reduced  Extension 50% reduced  Right lateral flexion    Left lateral flexion    Right rotation    Left rotation     (Blank rows = not tested)   LOWER EXTREMITY MMT:     MMT Right eval Left eval  Hip flexion 3/5 3+/5  Hip extension      Hip abduction 3/5 3+/5  Hip adduction 3/5 3+/5  Hip internal rotation      Hip external rotation      Knee flexion 3+/5 4/5  Knee extension 3+/5 4/5  Ankle dorsiflexion      Ankle plantarflexion      Ankle inversion      Ankle eversion       (Blank rows = not tested)   LUMBAR SPECIAL TESTS:  Straight leg raise test: Positive and Slump test: Positive   FUNCTIONAL TESTS:  30 Second Sit to Stand: 6 reps - 05/03/2022   GAIT: Distance walked: 24f Assistive device utilized: Single point cane Level of assistance: Modified independence Comments: slowed, antalgic gait on R LE   TREATMENT: [facility] Treatment:                                                DATE: 05/13/22 [address] - therapeutic pool temp approximately 92 degrees Pt enters building ***  Treatment took place in water 3.8 to  4 ft 8 in.feet deep depending upon activity.  Pt entered and exited the pool via stair and handrails ***.  Patient entered water for aquatic therapy for first time and was introduced to principles and therapeutic effects of water as they ambulated and acclimated to pool.  Therapeutic Exercise: Walking forward/backwards/side stepping At edge of pool, pt performed LE exercise: Hip abd/add x20 BIL Hip ext/flex with knee straight x 20 BIL Hip Circles CC/CCW 2x10 each BIL Marching hip flexion to knee extension 2x10 BIL Hamstring  curl x20 BIL Squats 2x20 Step ups on submerged step 2x10 BIL Step up and overs on submerged step 2x10 BIL Sitting on bench in water: Bicycle kicks x1' Reverse bicycle kicks x1' Flutter kicks x1' Scissor kicks x1' Kickboard push/pull x1' Kickboard push downs x1'  [facility] Adult PT Treatment:                                                DATE: 05/10/22 Therapeutic Exercise: NuStep lvl 5 UE/LE x 5 min while taking subjective  LAQ 3x10 4# Standing hip abd/ext 2x10 BIL Manual Therapy: STM to bilateral lumbar paraspinals, QL   [facility] Adult PT Treatment:                                                DATE: 05/05/22 Therapeutic Exercise: NuStep lvl 5 UE/LE x 5 min while taking subjective  STS 2x10 Seated clamshell  2x20 BTB Manual Therapy: STM to bilateral lumbar paraspinals, QL    PATIENT EDUCATION:  Education details: continue HEP Person educated: Patient Education method: Explanation, Demonstration, and Handouts Education comprehension: verbalized understanding and returned demonstration   HOME EXERCISE PROGRAM: Access Code: [email or web address] URL: [email or web address] Date: 03/01/2022 Prepared by: Octavio Manns   Exercises - Seated Sciatic Tensioner  - 1 x daily - 7 x weekly - 2 sets - 10 reps - Active Straight Leg Raise with Quad Set  - 1 x daily - 7 x weekly - 2 sets - 10 reps - Supine Bridge  - 1 x daily - 7 x weekly - 2 sets - 10 reps - Seated Hip Abduction with Resistance  - 1 x daily - 7 x weekly - 3 sets - 15 reps - Supine Lower Trunk Rotation  - 1 x daily - 7 x weekly - 2 sets - 10 reps   ASSESSMENT:   CLINICAL IMPRESSION: *** Patient presents to first aquatic PT session reporting   Patient presents to PT reporting continued lower back pain. Session today focused on proximal hip strengthening as well as manual techniques to decrease tissue restriction. Patient continues to benefit from skilled PT services and should be progressed as able to improve functional  independence.   OBJECTIVE IMPAIRMENTS: decreased activity tolerance, decreased balance, decreased endurance, decreased mobility, difficulty walking, decreased ROM, decreased strength, and pain.    ACTIVITY LIMITATIONS: carrying, lifting, bending, standing, squatting, stairs, transfers, and locomotion level   PARTICIPATION LIMITATIONS: meal prep, cleaning, driving, shopping, community activity, occupation, and yard work   PERSONAL FACTORS: Fitness, Time since onset of injury/illness/exacerbation, and 1-2 comorbidities: CKD, Breast cancer, Cardiomyopathy, DM II  are also affecting patient's functional outcome.      GOALS: Goals reviewed with patient? No   SHORT TERM GOALS: Target date: 03/22/2022   Pt will be compliant and knowledgeable with initial HEP for improved comfort and carryover Baseline: initial HEP given  Goal status: MET   2.  Pt will self report lower back pain no greater than 5/10 for improved comfort and functional ability Baseline: 7/10 at worst Goal status:ONGOING   LONG TERM GOALS: Target date: 05/26/2022   Pt will self report lower back pain no greater than 2/10 for improved comfort and functional ability Baseline: 7/10 at worst Goal status: ONGOING   2.  Pt will improve FOTO function score to no less than 43% as proxy for functional improvement Baseline: 34% function 04/21/2022: 36% function 05/03/2022: 30% function Goal status: ONGOING    3.  Pt will increase 30 Second Sit to Stand rep count to no less than 7 reps (MCID 2) for improved balance, strength, and functional mobility Baseline: 5 reps  04/21/2022: 4 reps 05/03/2022: 6 reps  Goal status: ONGOING    4.  Pt will increase standing tolerance to no less than 20 minutes without pain for improved functional ability to perform home ADLs Baseline: 10 minutes Goal status: ONGOING   PLAN:   PT FREQUENCY: 2x/week   PT DURATION: 8 weeks   PLANNED INTERVENTIONS: Therapeutic exercises, Therapeutic activity,  Neuromuscular re-education, Balance training, Gait training, Patient/Family education, Self Care, Joint mobilization, Aquatic Therapy, Dry Needling, Electrical stimulation, Cryotherapy, Moist heat, Manual therapy, and Re-evaluation.   PLAN FOR NEXT SESSION: assess HEP response, progress core and proximal hip strength, functional activity tolerance   Margarette Canada, PTA 05/12/2022, 2:33 PM

## 2022-05-13 ENCOUNTER — Ambulatory Visit: Payer: Medicare HMO | Attending: Family Medicine

## 2022-05-13 ENCOUNTER — Telehealth: Payer: Self-pay

## 2022-05-13 DIAGNOSIS — M6281 Muscle weakness (generalized): Secondary | ICD-10-CM | POA: Insufficient documentation

## 2022-05-13 DIAGNOSIS — M5459 Other low back pain: Secondary | ICD-10-CM | POA: Insufficient documentation

## 2022-05-13 DIAGNOSIS — R2689 Other abnormalities of gait and mobility: Secondary | ICD-10-CM | POA: Insufficient documentation

## 2022-05-13 NOTE — Telephone Encounter (Signed)
Spoke to patient regarding missed appointment. She thought that her aquatic session was at the same time as previous land sessions, 2:45pm. Confirmed next appointment time with patient. Will add to aquatic waitlist for next week.  1st no-show  [name], [place] 05/13/22 1:11 PM

## 2022-05-17 ENCOUNTER — Ambulatory Visit: Payer: Medicare HMO

## 2022-05-17 DIAGNOSIS — M5459 Other low back pain: Secondary | ICD-10-CM | POA: Diagnosis not present

## 2022-05-17 DIAGNOSIS — M6281 Muscle weakness (generalized): Secondary | ICD-10-CM

## 2022-05-17 DIAGNOSIS — R2689 Other abnormalities of gait and mobility: Secondary | ICD-10-CM

## 2022-05-17 NOTE — Therapy (Addendum)
OUTPATIENT PHYSICAL THERAPY TREATMENT NOTE/DISCHARGE  PHYSICAL THERAPY DISCHARGE SUMMARY  Visits from Start of Care: 16  Current functional level related to goals / functional outcomes: See goals/objective   Remaining deficits: See goals/objective   Education / Equipment: HEP   Patient agrees to discharge. Patient goals were partially met. Patient is being discharged due to not returning since the last visit.     Patient Name: [name] MRN: [id number] DOB:11/15/1950, 72 y.o., female Today's Date: 05/17/2022  PCP: [name], MD  REFERRING PROVIDER: Arne Cleveland. Heagen, PA-C   END OF SESSION:   PT End of Session - 05/17/22 1434     Visit Number 16    Number of Visits 17    Date for PT Re-Evaluation 05/26/22    Authorization Type [employer] MCR    PT Start Time [id number]    PT Stop Time 1525    PT Time Calculation (min) 40 min    Activity Tolerance Patient tolerated treatment well;Patient limited by pain    Behavior During Therapy [facility] for tasks assessed/performed              Past Medical History:  Diagnosis Date   Anginal pain ([place]) 03/19/2012   saw Dr. Cathie Olden .. she thinks its more related to stomach issues   Arthritis    "all over; mostly in my back" (03/20/2012)   Breast cancer ([place])    "right" (03/20/2012)   Cardiomyopathy    Chest pain 06/2005   Hospitalized, dystolic dysfunction   Chronic lower back pain    "have 2 herniated discs; going to have to have a fusion" (03/20/2012)   CKD (chronic kidney disease), stage III ([place])    Family history of anesthesia complication    "father has nausea and vomiting"   Gallstones    GERD (gastroesophageal reflux disease)    H/O hiatal hernia    History of kidney stones    History of right mastectomy    Hypertension    "patient states never had HTN, takes Hyzaar for heart   Hypothyroidism    Irritable bowel syndrome    Migraines    "it's been a long time since I've had one" (03/20/2012)    Osteoarthritis    Peripheral neuropathy    PONV (postoperative nausea and vomiting)    Sciatica    Type II diabetes mellitus ([place])    Past Surgical History:  Procedure Laterality Date   ABDOMINAL HYSTERECTOMY  1993   adenosin cardiolite  07/2005   (+) wall motion abnormality   AXILLARY LYMPH NODE DISSECTION  2003   squamous cell cancer   BACK SURGERY     CARDIAC CATHETERIZATION  ? 2005 / 2007   CHOLECYSTECTOMY N/A 01/24/2018   Procedure: LAPAROSCOPIC CHOLECYSTECTOMY;  Surgeon: Erroll Luna, MD;  Location: [place] [facility];  Service: General;  Laterality: N/A;   CT abdomen and pelvis  02/2010   Same   ESOPHAGOGASTRODUODENOSCOPY  2005   GERD   KNEE ARTHROSCOPY  1990's   "right" (03/20/2012)   LUMBAR FUSION N/A 08/22/2014   Procedure: Right L2-3 and right L1-2 transforaminal lumbar interbody fusion with pedicle screws, rods, sleeves, cages, local bone graft, Vivigen, cancellous chips;  Surgeon: Jessy Oto, MD;  Location: [place];  Service: Orthopedics;  Laterality: N/A;   LUMBAR LAMINECTOMY/DECOMPRESSION MICRODISCECTOMY N/A 02/04/2014   Procedure: RIGHT L2-3 MICRODISCECTOMY ;  Surgeon: Jessy Oto, MD;  Location: [place];  Service: Orthopedics;  Laterality: N/A;   MASTECTOMY Right    MASTECTOMY  WITH AXILLARY LYMPH NODE DISSECTION  12/2003   "right" (03/20/2012)   POSTERIOR CERVICAL FUSION/FORAMINOTOMY  2001   SHOULDER ARTHROSCOPY W/ ROTATOR CUFF REPAIR  2003   Left   THYROIDECTOMY  1977   Right   TOTAL KNEE ARTHROPLASTY  2000   Right   [place] of abdomen  07/2005   Fatty liver / kidney stones   Patient Active Problem List   Diagnosis Date Noted   Positive screening for depression on 9-item Patient Health Questionnaire (PHQ-9) 03/07/2022   Hammer toe of second toe of left foot 05/05/2021   Corns and callosities 05/05/2021   Hav (hallux abducto valgus), unspecified laterality 05/05/2021   Hyperlipidemia associated with type 2 diabetes mellitus ([place]) 02/01/2021   Depression 02/01/2021    Localized osteoporosis without current pathological fracture 05/01/2020   Hyperparathyroidism ([place]) 12/13/2019   Elevated hemoglobin ([place]) 12/13/2019   Chronic renal impairment, stage 4 (severe) ([place]) 10/28/2019   Anemia 10/28/2019   Physical exam 06/26/2019   Neck pain 04/18/2019   Ascending aortic aneurysm ([place]) 04/17/2019   Hypoxia    Nonspecific chest pain 04/15/2019   Neuropathic pain of right flank 12/27/2018   Dysphagia 12/27/2018   Postsurgical hypothyroidism 06/11/2018   Vitamin D deficiency 06/11/2018   Multinodular goiter 06/11/2018   Hypercalcemia 06/11/2018   DM type 2 causing vascular disease (HCC)    PONV (postoperative nausea and vomiting)    Peripheral neuropathy    Migraine    Irritable bowel syndrome    Hypertension    History of right mastectomy    H/O hiatal hernia    GERD (gastroesophageal reflux disease)    Family history of anesthesia complication    Other chronic pain    Type 2 diabetes mellitus with diabetic chronic kidney disease ([place])    Lower extremity edema    Chronic constipation 06/23/2015   Chronic diastolic heart failure ([place]) 11/17/2014   Herniated nucleus pulposus, L2-3 right 02/04/2014    Class: Acute   Spondylolisthesis of lumbar region 04/30/2012    Class: Chronic   Lumbar stenosis with neurogenic claudication 04/30/2012    Class: Chronic   SYNCOPE-CAROTID SINUS 09/16/2008   OSTEOARTHRITIS 08/23/2006   DDD (degenerative disc disease), cervical 08/23/2006   DISC DISEASE, LUMBAR 08/23/2006   Headache(784.0) 08/23/2006   BREAST CANCER, HX OF 08/23/2006   HYPERPARATHYROIDISM, HX OF 08/23/2006   RENAL CALCULUS, HX OF 08/23/2006    REFERRING DIAG: OTHER spondylosis with myelopathy ,lumbar region    THERAPY DIAG:  Other low back pain  Muscle weakness (generalized)  Other abnormalities of gait and mobility  Rationale for Evaluation and Treatment Rehabilitation  PERTINENT HISTORY: Hx of lumbar fusions, CKD, Breast cancer,  Cardiomyopathy, DM II, R TKA   PRECAUTIONS: None   SUBJECTIVE:  SUBJECTIVE STATEMENT: Patient reports continued pain.  PAIN:  Are you having pain?  Yes: NPRS scale: 8/10 Worst: 9/10 Pain location: lower back, R LE Pain description: dull ache, N/T Aggravating factors: prolonged standing, walking Relieving factors: rest   OBJECTIVE: (objective measures completed at initial evaluation unless otherwise dated)  DIAGNOSTIC FINDINGS:  See imaging   PATIENT SURVEYS:  FOTO: 30% function; 43% predicted - 05/03/2022   COGNITION: Overall cognitive status: Within functional limits for tasks assessed               SENSATION: Light touch: Impaired - decreased light touch R LE   POSTURE: rounded shoulders, forward head, and increased lumbar lordosis   PALPATION: TTP to R posterior hip musculature    LUMBAR ROM:    AROM eval  Flexion 50% reduced  Extension 50% reduced  Right lateral flexion    Left lateral flexion    Right rotation    Left rotation     (Blank rows = not tested)   LOWER EXTREMITY MMT:     MMT Right eval Left eval  Hip flexion 3/5 3+/5  Hip extension      Hip abduction 3/5 3+/5  Hip adduction 3/5 3+/5  Hip internal rotation      Hip external rotation      Knee flexion 3+/5 4/5  Knee extension 3+/5 4/5  Ankle dorsiflexion      Ankle plantarflexion      Ankle inversion      Ankle eversion       (Blank rows = not tested)   LUMBAR SPECIAL TESTS:  Straight leg raise test: Positive and Slump test: Positive   FUNCTIONAL TESTS:  30 Second Sit to Stand: 6 reps - 05/03/2022   GAIT: Distance walked: 72f Assistive device utilized: Single point cane Level of assistance: Modified independence Comments: slowed, antalgic gait on R LE   TREATMENT: [facility] Adult PT Treatment:                                                 DATE: 05/17/22 Therapeutic Exercise: NuStep lvl 5 UE/LE x 5 min while taking subjective  LAQ 3x10 4# STS 2x10 - from high table to FWW Standing hip abd/ext 2x10 BIL Standing hamstring curl 2x10 BIL Manual Therapy: STM to bilateral lumbar paraspinals, QL  [facility] Adult PT Treatment:                                                DATE: 05/10/22 Therapeutic Exercise: NuStep lvl 5 UE/LE x 5 min while taking subjective  LAQ 3x10 4# Standing hip abd/ext 2x10 BIL Manual Therapy: STM to bilateral lumbar paraspinals, QL   [facility] Adult PT Treatment:                                                DATE: 05/05/22 Therapeutic Exercise: NuStep lvl 5 UE/LE x 5 min while taking subjective  STS 2x10 Seated clamshell 2x20 BTB Manual Therapy: STM to bilateral lumbar paraspinals, QL    PATIENT EDUCATION:  Education details: continue HEP Person educated: Patient Education  method: Explanation, Demonstration, and Handouts Education comprehension: verbalized understanding and returned demonstration   HOME EXERCISE PROGRAM: Access Code: [email or web address] URL: [email or web address] Date: 03/01/2022 Prepared by: Octavio Manns   Exercises - Seated Sciatic Tensioner  - 1 x daily - 7 x weekly - 2 sets - 10 reps - Active Straight Leg Raise with Quad Set  - 1 x daily - 7 x weekly - 2 sets - 10 reps - Supine Bridge  - 1 x daily - 7 x weekly - 2 sets - 10 reps - Seated Hip Abduction with Resistance  - 1 x daily - 7 x weekly - 3 sets - 15 reps - Supine Lower Trunk Rotation  - 1 x daily - 7 x weekly - 2 sets - 10 reps   ASSESSMENT:   CLINICAL IMPRESSION: Patient presents to PT session reporting continued lower back pain. Session today focused on proximal hip strengthening with increased repetitions as able. Manual techniques utilized to decrease tissue restriction. Patient continues to benefit from skilled PT services and should be progressed as able to improve functional  independence.   OBJECTIVE IMPAIRMENTS: decreased activity tolerance, decreased balance, decreased endurance, decreased mobility, difficulty walking, decreased ROM, decreased strength, and pain.    ACTIVITY LIMITATIONS: carrying, lifting, bending, standing, squatting, stairs, transfers, and locomotion level   PARTICIPATION LIMITATIONS: meal prep, cleaning, driving, shopping, community activity, occupation, and yard work   PERSONAL FACTORS: Fitness, Time since onset of injury/illness/exacerbation, and 1-2 comorbidities: CKD, Breast cancer, Cardiomyopathy, DM II  are also affecting patient's functional outcome.      GOALS: Goals reviewed with patient? No   SHORT TERM GOALS: Target date: 03/22/2022   Pt will be compliant and knowledgeable with initial HEP for improved comfort and carryover Baseline: initial HEP given  Goal status: MET   2.  Pt will self report lower back pain no greater than 5/10 for improved comfort and functional ability Baseline: 7/10 at worst Goal status:ONGOING   LONG TERM GOALS: Target date: 05/26/2022   Pt will self report lower back pain no greater than 2/10 for improved comfort and functional ability Baseline: 7/10 at worst Goal status: ONGOING   2.  Pt will improve FOTO function score to no less than 43% as proxy for functional improvement Baseline: 34% function 04/21/2022: 36% function 05/03/2022: 30% function Goal status: ONGOING    3.  Pt will increase 30 Second Sit to Stand rep count to no less than 7 reps (MCID 2) for improved balance, strength, and functional mobility Baseline: 5 reps  04/21/2022: 4 reps 05/03/2022: 6 reps  Goal status: ONGOING    4.  Pt will increase standing tolerance to no less than 20 minutes without pain for improved functional ability to perform home ADLs Baseline: 10 minutes Goal status: ONGOING   PLAN:   PT FREQUENCY: 2x/week   PT DURATION: 8 weeks   PLANNED INTERVENTIONS: Therapeutic exercises, Therapeutic activity,  Neuromuscular re-education, Balance training, Gait training, Patient/Family education, Self Care, Joint mobilization, Aquatic Therapy, Dry Needling, Electrical stimulation, Cryotherapy, Moist heat, Manual therapy, and Re-evaluation.   PLAN FOR NEXT SESSION: assess HEP response, progress core and proximal hip strength, functional activity tolerance   Margarette Canada, PTA 05/17/2022, 3:29 PM

## 2022-05-18 ENCOUNTER — Telehealth: Payer: Self-pay

## 2022-05-18 NOTE — Telephone Encounter (Signed)
LVM to see if patient wants to take aquatic appointment on Friday.  Margarette Canada, PTA 05/18/22 1:53 PM

## 2022-05-19 ENCOUNTER — Ambulatory Visit: Payer: Medicare HMO

## 2022-05-19 NOTE — Therapy (Incomplete)
OUTPATIENT PHYSICAL THERAPY TREATMENT NOTE     Patient Name: [name] MRN: [id number] DOB:[birth year], 72 y.o., female Today's Date: 05/19/2022  PCP: [name], MD  REFERRING PROVIDER: Arne Cleveland. Heagen, PA-C   END OF SESSION:      Past Medical History:  Diagnosis Date   Anginal pain ([place]) 03/19/2012   saw Dr. Cathie Olden .. she thinks its more related to stomach issues   Arthritis    "all over; mostly in my back" (03/20/2012)   Breast cancer ([place])    "right" (03/20/2012)   Cardiomyopathy    Chest pain 06/2005   Hospitalized, dystolic dysfunction   Chronic lower back pain    "have 2 herniated discs; going to have to have a fusion" (03/20/2012)   CKD (chronic kidney disease), stage III ([place])    Family history of anesthesia complication    "father has nausea and vomiting"   Gallstones    GERD (gastroesophageal reflux disease)    H/O hiatal hernia    History of kidney stones    History of right mastectomy    Hypertension    "patient states never had HTN, takes Hyzaar for heart   Hypothyroidism    Irritable bowel syndrome    Migraines    "it's been a long time since I've had one" (03/20/2012)   Osteoarthritis    Peripheral neuropathy    PONV (postoperative nausea and vomiting)    Sciatica    Type II diabetes mellitus ([place])    Past Surgical History:  Procedure Laterality Date   ABDOMINAL HYSTERECTOMY  1993   adenosin cardiolite  07/2005   (+) wall motion abnormality   AXILLARY LYMPH NODE DISSECTION  2003   squamous cell cancer   BACK SURGERY     CARDIAC CATHETERIZATION  ? 2005 / 2007   CHOLECYSTECTOMY N/A 01/24/2018   Procedure: LAPAROSCOPIC CHOLECYSTECTOMY;  Surgeon: Erroll Luna, MD;  Location: [facility];  Service: General;  Laterality: N/A;   CT abdomen and pelvis  02/2010   Same   ESOPHAGOGASTRODUODENOSCOPY  2005   GERD   KNEE ARTHROSCOPY  1990's   "right" (03/20/2012)   LUMBAR FUSION N/A 08/22/2014   Procedure: Right L2-3 and right L1-2  transforaminal lumbar interbody fusion with pedicle screws, rods, sleeves, cages, local bone graft, Vivigen, cancellous chips;  Surgeon: Jessy Oto, MD;  Location: [facility];  Service: Orthopedics;  Laterality: N/A;   LUMBAR LAMINECTOMY/DECOMPRESSION MICRODISCECTOMY N/A 02/04/2014   Procedure: RIGHT L2-3 MICRODISCECTOMY ;  Surgeon: Jessy Oto, MD;  Location: [facility];  Service: Orthopedics;  Laterality: N/A;   MASTECTOMY Right    MASTECTOMY WITH AXILLARY LYMPH NODE DISSECTION  12/2003   "right" (03/20/2012)   POSTERIOR CERVICAL FUSION/FORAMINOTOMY  2001   SHOULDER ARTHROSCOPY W/ ROTATOR CUFF REPAIR  2003   Left   THYROIDECTOMY  1977   Right   TOTAL KNEE ARTHROPLASTY  2000   Right   [place] of abdomen  07/2005   Fatty liver / kidney stones   Patient Active Problem List   Diagnosis Date Noted   Positive screening for depression on 9-item Patient Health Questionnaire (PHQ-9) 03/07/2022   Hammer toe of second toe of left foot 05/05/2021   Corns and callosities 05/05/2021   Hav (hallux abducto valgus), unspecified laterality 05/05/2021   Hyperlipidemia associated with type 2 diabetes mellitus ([place]) 02/01/2021   Depression 02/01/2021   Localized osteoporosis without current pathological fracture 05/01/2020   Hyperparathyroidism ([place]) 12/13/2019   Elevated hemoglobin ([place]) 12/13/2019  Chronic renal impairment, stage 4 (severe) (HCC) 10/28/2019   Anemia 10/28/2019   Physical exam 06/26/2019   Neck pain 04/18/2019   Ascending aortic aneurysm ([place]) 04/17/2019   Hypoxia    Nonspecific chest pain 04/15/2019   Neuropathic pain of right flank 12/27/2018   Dysphagia 12/27/2018   Postsurgical hypothyroidism 06/11/2018   Vitamin D deficiency 06/11/2018   Multinodular goiter 06/11/2018   Hypercalcemia 06/11/2018   DM type 2 causing vascular disease (HCC)    PONV (postoperative nausea and vomiting)    Peripheral neuropathy    Migraine    Irritable bowel syndrome    Hypertension    History of  right mastectomy    H/O hiatal hernia    GERD (gastroesophageal reflux disease)    Family history of anesthesia complication    Other chronic pain    Type 2 diabetes mellitus with diabetic chronic kidney disease ([place])    Lower extremity edema    Chronic constipation 06/23/2015   Chronic diastolic heart failure ([place]) 11/17/2014   Herniated nucleus pulposus, L2-3 right 02/04/2014    Class: Acute   Spondylolisthesis of lumbar region 04/30/2012    Class: Chronic   Lumbar stenosis with neurogenic claudication 04/30/2012    Class: Chronic   SYNCOPE-CAROTID SINUS 09/16/2008   OSTEOARTHRITIS 08/23/2006   DDD (degenerative disc disease), cervical 08/23/2006   DISC DISEASE, LUMBAR 08/23/2006   Headache(784.0) 08/23/2006   BREAST CANCER, HX OF 08/23/2006   HYPERPARATHYROIDISM, HX OF 08/23/2006   RENAL CALCULUS, HX OF 08/23/2006    REFERRING DIAG: OTHER spondylosis with myelopathy ,lumbar region    THERAPY DIAG:  No diagnosis found.  Rationale for Evaluation and Treatment Rehabilitation  PERTINENT HISTORY: Hx of lumbar fusions, CKD, Breast cancer, Cardiomyopathy, DM II, R TKA   PRECAUTIONS: None   SUBJECTIVE:                                                                                                                                                                                      SUBJECTIVE STATEMENT: *** PAIN:  Are you having pain?  Yes: NPRS scale: 8/10 Worst: 9/10 Pain location: lower back, R LE Pain description: dull ache, N/T Aggravating factors: prolonged standing, walking Relieving factors: rest   OBJECTIVE: (objective measures completed at initial evaluation unless otherwise dated)  DIAGNOSTIC FINDINGS:  See imaging   PATIENT SURVEYS:  FOTO: 30% function; 43% predicted - 05/03/2022   COGNITION: Overall cognitive status: Within functional limits for tasks assessed               SENSATION: Light touch: Impaired - decreased light touch R LE   POSTURE:  rounded shoulders, forward head, and  increased lumbar lordosis   PALPATION: TTP to R posterior hip musculature    LUMBAR ROM:    AROM eval  Flexion 50% reduced  Extension 50% reduced  Right lateral flexion    Left lateral flexion    Right rotation    Left rotation     (Blank rows = not tested)   LOWER EXTREMITY MMT:     MMT Right eval Left eval  Hip flexion 3/5 3+/5  Hip extension      Hip abduction 3/5 3+/5  Hip adduction 3/5 3+/5  Hip internal rotation      Hip external rotation      Knee flexion 3+/5 4/5  Knee extension 3+/5 4/5  Ankle dorsiflexion      Ankle plantarflexion      Ankle inversion      Ankle eversion       (Blank rows = not tested)   LUMBAR SPECIAL TESTS:  Straight leg raise test: Positive and Slump test: Positive   FUNCTIONAL TESTS:  30 Second Sit to Stand: 6 reps - 05/03/2022   GAIT: Distance walked: 1f Assistive device utilized: Single point cane Level of assistance: Modified independence Comments: slowed, antalgic gait on R LE   TREATMENT: [facility] Adult PT Treatment:                                                DATE: 05/17/22 Therapeutic Exercise: NuStep lvl 5 UE/LE x 5 min while taking subjective  LAQ 3x10 4# STS 2x10 - from high table to FWW Standing hip abd/ext 2x10 BIL Standing hamstring curl 2x10 BIL Manual Therapy: STM to bilateral lumbar paraspinals, QL  [facility] Adult PT Treatment:                                                DATE: 05/10/22 Therapeutic Exercise: NuStep lvl 5 UE/LE x 5 min while taking subjective  LAQ 3x10 4# Standing hip abd/ext 2x10 BIL Manual Therapy: STM to bilateral lumbar paraspinals, QL   [facility] Adult PT Treatment:                                                DATE: 05/05/22 Therapeutic Exercise: NuStep lvl 5 UE/LE x 5 min while taking subjective  STS 2x10 Seated clamshell 2x20 BTB Manual Therapy: STM to bilateral lumbar paraspinals, QL    PATIENT EDUCATION:  Education details: continue  HEP Person educated: Patient Education method: Explanation, Demonstration, and Handouts Education comprehension: verbalized understanding and returned demonstration   HOME EXERCISE PROGRAM: Access Code: [email or web address]: [email or web address] Date: 03/01/2022 Prepared by: DOctavio Manns  Exercises - Seated Sciatic Tensioner  - 1 x daily - 7 x weekly - 2 sets - 10 reps - Active Straight Leg Raise with Quad Set  - 1 x daily - 7 x weekly - 2 sets - 10 reps - Supine Bridge  - 1 x daily - 7 x weekly - 2 sets - 10 reps - Seated Hip Abduction with Resistance  - 1 x daily - 7 x weekly - 3 sets - 15 reps -  Supine Lower Trunk Rotation  - 1 x daily - 7 x weekly - 2 sets - 10 reps   ASSESSMENT:   CLINICAL IMPRESSION: ***  OBJECTIVE IMPAIRMENTS: decreased activity tolerance, decreased balance, decreased endurance, decreased mobility, difficulty walking, decreased ROM, decreased strength, and pain.    ACTIVITY LIMITATIONS: carrying, lifting, bending, standing, squatting, stairs, transfers, and locomotion level   PARTICIPATION LIMITATIONS: meal prep, cleaning, driving, shopping, community activity, occupation, and yard work   PERSONAL FACTORS: Fitness, Time since onset of injury/illness/exacerbation, and 1-2 comorbidities: CKD, Breast cancer, Cardiomyopathy, DM II  are also affecting patient's functional outcome.      GOALS: Goals reviewed with patient? No   SHORT TERM GOALS: Target date: 03/22/2022   Pt will be compliant and knowledgeable with initial HEP for improved comfort and carryover Baseline: initial HEP given  Goal status: MET   2.  Pt will self report lower back pain no greater than 5/10 for improved comfort and functional ability Baseline: 7/10 at worst Goal status:ONGOING   LONG TERM GOALS: Target date: 05/26/2022   Pt will self report lower back pain no greater than 2/10 for improved comfort and functional ability Baseline: 7/10 at worst Goal status: ONGOING    2.  Pt will improve FOTO function score to no less than 43% as proxy for functional improvement Baseline: 34% function 04/21/2022: 36% function 05/03/2022: 30% function Goal status: ONGOING    3.  Pt will increase 30 Second Sit to Stand rep count to no less than 7 reps (MCID 2) for improved balance, strength, and functional mobility Baseline: 5 reps  04/21/2022: 4 reps 05/03/2022: 6 reps  Goal status: ONGOING    4.  Pt will increase standing tolerance to no less than 20 minutes without pain for improved functional ability to perform home ADLs Baseline: 10 minutes Goal status: ONGOING   PLAN:   PT FREQUENCY: 2x/week   PT DURATION: 8 weeks   PLANNED INTERVENTIONS: Therapeutic exercises, Therapeutic activity, Neuromuscular re-education, Balance training, Gait training, Patient/Family education, Self Care, Joint mobilization, Aquatic Therapy, Dry Needling, Electrical stimulation, Cryotherapy, Moist heat, Manual therapy, and Re-evaluation.   PLAN FOR NEXT SESSION: assess HEP response, progress core and proximal hip strength, functional activity tolerance   Ward Chatters, PT 05/19/2022, 1:27 PM

## 2022-05-26 DIAGNOSIS — R809 Proteinuria, unspecified: Secondary | ICD-10-CM | POA: Diagnosis not present

## 2022-05-26 DIAGNOSIS — E1122 Type 2 diabetes mellitus with diabetic chronic kidney disease: Secondary | ICD-10-CM | POA: Diagnosis not present

## 2022-05-26 DIAGNOSIS — R803 Bence Jones proteinuria: Secondary | ICD-10-CM | POA: Diagnosis not present

## 2022-05-26 DIAGNOSIS — I272 Pulmonary hypertension, unspecified: Secondary | ICD-10-CM | POA: Diagnosis not present

## 2022-05-26 DIAGNOSIS — R768 Other specified abnormal immunological findings in serum: Secondary | ICD-10-CM | POA: Diagnosis not present

## 2022-05-26 DIAGNOSIS — N184 Chronic kidney disease, stage 4 (severe): Secondary | ICD-10-CM | POA: Diagnosis not present

## 2022-05-26 DIAGNOSIS — I5032 Chronic diastolic (congestive) heart failure: Secondary | ICD-10-CM | POA: Diagnosis not present

## 2022-05-26 DIAGNOSIS — I130 Hypertensive heart and chronic kidney disease with heart failure and stage 1 through stage 4 chronic kidney disease, or unspecified chronic kidney disease: Secondary | ICD-10-CM | POA: Diagnosis not present

## 2022-06-14 ENCOUNTER — Ambulatory Visit (INDEPENDENT_AMBULATORY_CARE_PROVIDER_SITE_OTHER): Payer: Medicare HMO | Admitting: Family Medicine

## 2022-06-14 ENCOUNTER — Encounter: Payer: Self-pay | Admitting: Family Medicine

## 2022-06-14 VITALS — BP 112/68 | HR 48 | Temp 98.1°F | Resp 17 | Ht 64.0 in | Wt 179.5 lb

## 2022-06-14 DIAGNOSIS — Z0000 Encounter for general adult medical examination without abnormal findings: Secondary | ICD-10-CM | POA: Diagnosis not present

## 2022-06-14 DIAGNOSIS — Z794 Long term (current) use of insulin: Secondary | ICD-10-CM | POA: Diagnosis not present

## 2022-06-14 DIAGNOSIS — R1319 Other dysphagia: Secondary | ICD-10-CM | POA: Diagnosis not present

## 2022-06-14 DIAGNOSIS — I7121 Aneurysm of the ascending aorta, without rupture: Secondary | ICD-10-CM | POA: Diagnosis not present

## 2022-06-14 DIAGNOSIS — E559 Vitamin D deficiency, unspecified: Secondary | ICD-10-CM

## 2022-06-14 DIAGNOSIS — E213 Hyperparathyroidism, unspecified: Secondary | ICD-10-CM

## 2022-06-14 DIAGNOSIS — R32 Unspecified urinary incontinence: Secondary | ICD-10-CM

## 2022-06-14 DIAGNOSIS — Z1211 Encounter for screening for malignant neoplasm of colon: Secondary | ICD-10-CM

## 2022-06-14 DIAGNOSIS — E1122 Type 2 diabetes mellitus with diabetic chronic kidney disease: Secondary | ICD-10-CM

## 2022-06-14 DIAGNOSIS — N184 Chronic kidney disease, stage 4 (severe): Secondary | ICD-10-CM | POA: Diagnosis not present

## 2022-06-14 NOTE — Assessment & Plan Note (Signed)
Chronic problem.  Pt is supposed to be following w/ Endocrinology but she has not been seen recently.  UTD on eye exam.  Foot exam done today.  Will get microalbumin.  Check labs.  Adjust meds prn

## 2022-06-14 NOTE — Patient Instructions (Signed)
Follow up in 6 months to recheck BP, cholesterol, and sugar We'll notify you of your lab results and make any changes if needed We'll call you to schedule your swallowing and bladder appointments (GI and Urology) Call and schedule your mammogram at [facility] and return the Cologuard as directed Call with any questions or concerns Stay Safe!  Stay Healthy! Happy [place]!!

## 2022-06-14 NOTE — Progress Notes (Signed)
Subjective:    Patient ID: [name], female    DOB: [birth year], 72 y.o.   MRN: [id number]  HPI CPE- due for colon cancer screen, mammo.  UTD on eye exam and PNA.  Pt will do cologuard and will call [facility] to schedule mammo  Patient Care Team    Relationship Specialty Notifications Start End  [name], MD PCP - General Family Medicine  12/03/18   [name], MD PCP - Cardiology Cardiology  04/18/19   Cassandria Anger, MD Consulting Physician Endocrinology  12/03/18   [name], DPM Consulting Physician Podiatry  12/03/18   [name], MD Consulting Physician Pulmonary Disease  06/26/19   [name], MD Consulting Physician Ophthalmology  06/26/19   Claudia Desanctis, MD Consulting Physician Nephrology  06/25/20   Edythe Clarity, [facility]  Pharmacist  06/10/21    Comment: [phone]     Health Maintenance  Topic Date Due   COLONOSCOPY (Pts 45-3yr Insurance coverage will need to be confirmed)  06/09/2013   Diabetic kidney evaluation - Urine ACR  08/04/2020   MAMMOGRAM  08/25/2021   Hepatitis C Screening  01/07/2023 (Originally 09/29/1968)   HEMOGLOBIN A1C  09/16/2022   Diabetic kidney evaluation - eGFR measurement  10/14/2022   Medicare Annual Wellness (AWV)  12/24/2022   OPHTHALMOLOGY EXAM  05/05/2023   FOOT EXAM  06/14/2023   Pneumonia Vaccine 72 Years old  Completed   INFLUENZA VACCINE  Completed   DEXA SCAN  Completed   HPV VACCINES  Aged Out   DTaP/Tdap/Td  Discontinued   COVID-19 Vaccine  Discontinued   Zoster Vaccines- Shingrix  Discontinued     Review of Systems Patient reports no vision/ hearing changes, adenopathy,fever, weight change,  persistant/recurrent hoarseness, chest pain, palpitations, edema, persistant/recurrent cough, hemoptysis, dyspnea (rest/exertional/paroxysmal nocturnal), gastrointestinal bleeding (melena, rectal bleeding), abdominal pain, significant heartburn, bowel changes, Gyn symptoms (abnormal   bleeding, pain),  syncope, focal weakness, memory loss, numbness & tingling, skin/hair/nail changes, abnormal bruising or bleeding, anxiety, or depression.   + dysphagia + bladder incontinence    Objective:   Physical Exam General Appearance:    Alert, cooperative, no distress, appears stated age  Head:    Normocephalic, without obvious abnormality, atraumatic  Eyes:    PERRL, conjunctiva/corneas clear, EOM's intact both eyes  Ears:    Normal TM's and external ear canals, both ears  Nose:   Nares normal, septum midline, mucosa normal, no drainage    or sinus tenderness  Throat:   Lips, mucosa, and tongue normal; teeth and gums normal  Neck:   Supple, symmetrical, trachea midline, no adenopathy;    Thyroid: no enlargement/tenderness/nodules  Back:     Symmetric, no curvature, ROM normal, no CVA tenderness  Lungs:     Clear to auscultation bilaterally, respirations unlabored  Chest Wall:    No tenderness or deformity   Heart:    Regular rate and rhythm, S1 and S2 normal, no murmur, rub   or gallop  Breast Exam:    Deferred to GYN  Abdomen:     Soft, non-tender, bowel sounds active all four quadrants,    no masses, no organomegaly  Genitalia:    Deferred to GYN  Rectal:    Extremities:   Extremities normal, atraumatic, no cyanosis or edema  Pulses:   2+ and symmetric all extremities  Skin:   Skin color, texture, turgor normal, no rashes or lesions  Lymph nodes:   Cervical, supraclavicular,  and axillary nodes normal  Neurologic:   CNII-XII intact, normal strength, sensation and reflexes    throughout          Assessment & Plan:

## 2022-06-14 NOTE — Assessment & Plan Note (Signed)
She has f/u imaging and appt w/ vascular upcoming

## 2022-06-14 NOTE — Assessment & Plan Note (Signed)
Pt's PE WNL and unchanged from previous.  Pt agreeable to Cologuard- order entered.  She will call [facility] to schedule mammo.  Check labs.  Anticipatory guidance provided.

## 2022-06-14 NOTE — Assessment & Plan Note (Signed)
Check labs and replete prn. 

## 2022-06-14 NOTE — Assessment & Plan Note (Signed)
Pt has hx of similar.  Refer to GI for complete evaluation

## 2022-06-14 NOTE — Assessment & Plan Note (Signed)
Ongoing issue.  Following w/ Endo and Nephrology

## 2022-06-15 LAB — CBC WITH DIFFERENTIAL/PLATELET
Basophils Absolute: 0.1 10*3/uL (ref 0.0–0.1)
Basophils Relative: 1.1 % (ref 0.0–3.0)
Eosinophils Absolute: 0.4 10*3/uL (ref 0.0–0.7)
Eosinophils Relative: 4.3 % (ref 0.0–5.0)
HCT: 35.7 % — ABNORMAL LOW (ref 36.0–46.0)
Hemoglobin: 11.8 g/dL — ABNORMAL LOW (ref 12.0–15.0)
Lymphocytes Relative: 28.3 % (ref 12.0–46.0)
Lymphs Abs: 2.5 10*3/uL (ref 0.7–4.0)
MCHC: 33.1 g/dL (ref 30.0–36.0)
MCV: 85.9 fl (ref 78.0–100.0)
Monocytes Absolute: 0.7 10*3/uL (ref 0.1–1.0)
Monocytes Relative: 8.2 % (ref 3.0–12.0)
Neutro Abs: 5.1 10*3/uL (ref 1.4–7.7)
Neutrophils Relative %: 58.1 % (ref 43.0–77.0)
Platelets: 317 10*3/uL (ref 150.0–400.0)
RBC: 4.15 Mil/uL (ref 3.87–5.11)
RDW: 15.9 % — ABNORMAL HIGH (ref 11.5–15.5)
WBC: 8.8 10*3/uL (ref 4.0–10.5)

## 2022-06-15 LAB — VITAMIN D 25 HYDROXY (VIT D DEFICIENCY, FRACTURES): VITD: 22.86 ng/mL — ABNORMAL LOW (ref 30.00–100.00)

## 2022-06-15 LAB — BASIC METABOLIC PANEL
BUN: 34 mg/dL — ABNORMAL HIGH (ref 6–23)
CO2: 20 mEq/L (ref 19–32)
Calcium: 10.7 mg/dL — ABNORMAL HIGH (ref 8.4–10.5)
Chloride: 105 mEq/L (ref 96–112)
Creatinine, Ser: 1.79 mg/dL — ABNORMAL HIGH (ref 0.40–1.20)
GFR: 27.75 mL/min — ABNORMAL LOW (ref >60.00)
Glucose, Bld: 196 mg/dL — ABNORMAL HIGH (ref 70–99)
Potassium: 4.7 mEq/L (ref 3.5–5.1)
Sodium: 136 mEq/L (ref 135–145)

## 2022-06-15 LAB — HEPATIC FUNCTION PANEL
ALT: 8 U/L (ref 0–35)
AST: 14 U/L (ref 0–37)
Albumin: 3.5 g/dL (ref 3.5–5.2)
Alkaline Phosphatase: 50 U/L (ref 39–117)
Bilirubin, Direct: 0.1 mg/dL (ref 0.0–0.3)
Total Bilirubin: 0.4 mg/dL (ref 0.2–1.2)
Total Protein: 7.3 g/dL (ref 6.0–8.3)

## 2022-06-15 LAB — LIPID PANEL
Cholesterol: 133 mg/dL (ref 0–200)
HDL: 41.5 mg/dL (ref >39.00)
NonHDL: 91.25
Total CHOL/HDL Ratio: 3
Triglycerides: 225 mg/dL — ABNORMAL HIGH (ref 0.0–149.0)
VLDL: 45 mg/dL — ABNORMAL HIGH (ref 0.0–40.0)

## 2022-06-15 LAB — TSH: TSH: 1.72 u[IU]/mL (ref 0.35–5.50)

## 2022-06-15 LAB — LDL CHOLESTEROL, DIRECT: Direct LDL: 58 mg/dL

## 2022-06-15 LAB — HEMOGLOBIN A1C: Hgb A1c MFr Bld: 8.4 % — ABNORMAL HIGH (ref 4.6–6.5)

## 2022-06-16 ENCOUNTER — Other Ambulatory Visit: Payer: Self-pay

## 2022-06-16 ENCOUNTER — Telehealth: Payer: Self-pay

## 2022-06-16 DIAGNOSIS — E559 Vitamin D deficiency, unspecified: Secondary | ICD-10-CM

## 2022-06-16 MED ORDER — VITAMIN D (ERGOCALCIFEROL) 1.25 MG (50000 UNIT) PO CAPS: 50000.0000 [IU] | ORAL_CAPSULE | ORAL | 12 refills | Status: DC

## 2022-06-16 NOTE — Telephone Encounter (Signed)
-----   Message from [name], MD sent at 06/16/2022  7:38 AM EST ----- Vit D is low.  Based on this, we need to start 50,000 units weekly x12 weeks in addition to daily OTC supplement of at least 2000 units.   Your A1C is still above goal.  Please schedule an appt with your Endocrinologist ASAP to adjust medications  Your Creatinine (kidney function) actually looks better!!  Remainder of labs are stable and look good

## 2022-06-16 NOTE — Telephone Encounter (Signed)
Informed pt of lab results . Vit  D  50,000 unit has been sent in

## 2022-06-20 ENCOUNTER — Telehealth: Payer: Self-pay

## 2022-06-20 NOTE — Telephone Encounter (Signed)
-----   Message from [name], MD sent at 06/20/2022  7:28 AM EDT ----- I appreciate Dr Luis Abed feedback on pt's low Vit D level.  Since they are working up her calcium and parathyroid hormone, please DO NOT take the 50,000 units of Vit D and instead take OTC 1000 units daily until they can determine what is going on.

## 2022-06-20 NOTE — Telephone Encounter (Signed)
Informed pt of lab results  

## 2022-06-22 ENCOUNTER — Other Ambulatory Visit (HOSPITAL_COMMUNITY): Payer: Self-pay | Admitting: Nephrology

## 2022-06-22 DIAGNOSIS — E213 Hyperparathyroidism, unspecified: Secondary | ICD-10-CM

## 2022-06-23 DIAGNOSIS — Z1211 Encounter for screening for malignant neoplasm of colon: Secondary | ICD-10-CM | POA: Diagnosis not present

## 2022-06-29 ENCOUNTER — Encounter (HOSPITAL_COMMUNITY)
Admission: RE | Admit: 2022-06-29 | Discharge: 2022-06-29 | Disposition: A | Discharge status: Home / Self Care | Payer: Medicare HMO | Source: Ambulatory Visit | Attending: Nephrology | Admitting: Nephrology

## 2022-06-29 ENCOUNTER — Encounter (HOSPITAL_COMMUNITY): Payer: Self-pay

## 2022-06-29 DIAGNOSIS — E213 Hyperparathyroidism, unspecified: Secondary | ICD-10-CM

## 2022-06-29 MED ORDER — TECHNETIUM TC 99M SESTAMIBI - CARDIOLITE
25.0000 | Freq: Once | INTRAVENOUS | Status: AC | PRN
  Administered 2022-06-29: 24 via INTRAVENOUS

## 2022-06-30 ENCOUNTER — Ambulatory Visit: Payer: Medicare HMO

## 2022-06-30 LAB — COLOGUARD: COLOGUARD: POSITIVE — AB

## 2022-07-01 ENCOUNTER — Other Ambulatory Visit: Payer: Medicare HMO

## 2022-07-01 ENCOUNTER — Other Ambulatory Visit: Payer: Self-pay

## 2022-07-01 ENCOUNTER — Ambulatory Visit: Payer: Medicare HMO | Admitting: Thoracic Surgery (Cardiothoracic Vascular Surgery)

## 2022-07-01 ENCOUNTER — Telehealth: Payer: Self-pay

## 2022-07-01 DIAGNOSIS — R195 Other fecal abnormalities: Secondary | ICD-10-CM

## 2022-07-01 DIAGNOSIS — Z1211 Encounter for screening for malignant neoplasm of colon: Secondary | ICD-10-CM

## 2022-07-01 NOTE — Telephone Encounter (Signed)
Informed pt of lab results . Referral to GI has been placed

## 2022-07-01 NOTE — Telephone Encounter (Signed)
-----   Message from [name], MD sent at 07/01/2022  7:26 AM EDT ----- Your cologuard is positive.  This DOES NOT mean that you have colon cancer but it means that we need to refer you to GI for a more complete evaluation (colonoscopy).  I know this is not the news you had hoped for, but it's much better to be safe than sorry.

## 2022-07-20 ENCOUNTER — Encounter (HOSPITAL_COMMUNITY): Payer: Self-pay

## 2022-07-20 ENCOUNTER — Observation Stay (HOSPITAL_COMMUNITY)
Admission: EM | Admit: 2022-07-20 | Discharge: 2022-07-22 | Disposition: A | Discharge status: Home / Self Care | Payer: Medicare HMO | Attending: Internal Medicine | Admitting: Internal Medicine

## 2022-07-20 ENCOUNTER — Emergency Department (HOSPITAL_COMMUNITY): Payer: Medicare HMO

## 2022-07-20 ENCOUNTER — Other Ambulatory Visit: Payer: Self-pay

## 2022-07-20 DIAGNOSIS — E890 Postprocedural hypothyroidism: Secondary | ICD-10-CM | POA: Diagnosis not present

## 2022-07-20 DIAGNOSIS — Z23 Encounter for immunization: Secondary | ICD-10-CM | POA: Insufficient documentation

## 2022-07-20 DIAGNOSIS — E1122 Type 2 diabetes mellitus with diabetic chronic kidney disease: Secondary | ICD-10-CM | POA: Diagnosis not present

## 2022-07-20 DIAGNOSIS — I130 Hypertensive heart and chronic kidney disease with heart failure and stage 1 through stage 4 chronic kidney disease, or unspecified chronic kidney disease: Secondary | ICD-10-CM | POA: Diagnosis not present

## 2022-07-20 DIAGNOSIS — E1159 Type 2 diabetes mellitus with other circulatory complications: Secondary | ICD-10-CM

## 2022-07-20 DIAGNOSIS — E1169 Type 2 diabetes mellitus with other specified complication: Secondary | ICD-10-CM | POA: Diagnosis present

## 2022-07-20 DIAGNOSIS — I5032 Chronic diastolic (congestive) heart failure: Secondary | ICD-10-CM | POA: Diagnosis not present

## 2022-07-20 DIAGNOSIS — R001 Bradycardia, unspecified: Secondary | ICD-10-CM | POA: Insufficient documentation

## 2022-07-20 DIAGNOSIS — W19XXXA Unspecified fall, initial encounter: Secondary | ICD-10-CM | POA: Diagnosis not present

## 2022-07-20 DIAGNOSIS — E1140 Type 2 diabetes mellitus with diabetic neuropathy, unspecified: Secondary | ICD-10-CM | POA: Insufficient documentation

## 2022-07-20 DIAGNOSIS — M542 Cervicalgia: Secondary | ICD-10-CM | POA: Diagnosis not present

## 2022-07-20 DIAGNOSIS — S51011A Laceration without foreign body of right elbow, initial encounter: Secondary | ICD-10-CM | POA: Diagnosis not present

## 2022-07-20 DIAGNOSIS — N184 Chronic kidney disease, stage 4 (severe): Secondary | ICD-10-CM | POA: Diagnosis present

## 2022-07-20 DIAGNOSIS — R9082 White matter disease, unspecified: Secondary | ICD-10-CM | POA: Diagnosis not present

## 2022-07-20 DIAGNOSIS — Z853 Personal history of malignant neoplasm of breast: Secondary | ICD-10-CM | POA: Diagnosis not present

## 2022-07-20 DIAGNOSIS — W1839XA Other fall on same level, initial encounter: Secondary | ICD-10-CM | POA: Insufficient documentation

## 2022-07-20 DIAGNOSIS — Z91040 Latex allergy status: Secondary | ICD-10-CM | POA: Diagnosis not present

## 2022-07-20 DIAGNOSIS — T1490XA Injury, unspecified, initial encounter: Secondary | ICD-10-CM | POA: Diagnosis not present

## 2022-07-20 DIAGNOSIS — I10 Essential (primary) hypertension: Secondary | ICD-10-CM | POA: Diagnosis present

## 2022-07-20 DIAGNOSIS — R55 Syncope and collapse: Principal | ICD-10-CM | POA: Insufficient documentation

## 2022-07-20 DIAGNOSIS — Z043 Encounter for examination and observation following other accident: Secondary | ICD-10-CM | POA: Diagnosis not present

## 2022-07-20 DIAGNOSIS — Z96659 Presence of unspecified artificial knee joint: Secondary | ICD-10-CM | POA: Insufficient documentation

## 2022-07-20 LAB — URINALYSIS, ROUTINE W REFLEX MICROSCOPIC
Bilirubin Urine: NEGATIVE
Glucose, UA: >=500 mg/dL — AB
Ketones, ur: NEGATIVE mg/dL
Nitrite: NEGATIVE
Protein, ur: 100 mg/dL — AB
Specific Gravity, Urine: 1.006 (ref 1.005–1.030)
pH: 7 (ref 5.0–8.0)

## 2022-07-20 LAB — BASIC METABOLIC PANEL
Anion gap: 10 (ref 5–15)
BUN: 22 mg/dL (ref 8–23)
CO2: 24 mmol/L (ref 22–32)
Calcium: 10.4 mg/dL — ABNORMAL HIGH (ref 8.9–10.3)
Chloride: 106 mmol/L (ref 98–111)
Creatinine, Ser: 1.63 mg/dL — ABNORMAL HIGH (ref 0.44–1.00)
GFR, Estimated: 33 mL/min — ABNORMAL LOW (ref >60)
Glucose, Bld: 81 mg/dL (ref 70–99)
Potassium: 4.4 mmol/L (ref 3.5–5.1)
Sodium: 140 mmol/L (ref 135–145)

## 2022-07-20 LAB — CBG MONITORING, ED
Glucose-Capillary: 146 mg/dL — ABNORMAL HIGH (ref 70–99)
Glucose-Capillary: 83 mg/dL (ref 70–99)

## 2022-07-20 LAB — CBC
HCT: 36.7 % (ref 36.0–46.0)
Hemoglobin: 11.6 g/dL — ABNORMAL LOW (ref 12.0–15.0)
MCH: 29 pg (ref 26.0–34.0)
MCHC: 31.6 g/dL (ref 30.0–36.0)
MCV: 91.8 fL (ref 80.0–100.0)
Platelets: 317 10*3/uL (ref 150–400)
RBC: 4 MIL/uL (ref 3.87–5.11)
RDW: 14.7 % (ref 11.5–15.5)
WBC: 10.5 10*3/uL (ref 4.0–10.5)
nRBC: 0 % (ref 0.0–0.2)

## 2022-07-20 LAB — TROPONIN I (HIGH SENSITIVITY)
Troponin I (High Sensitivity): 49 ng/L — ABNORMAL HIGH (ref <18)
Troponin I (High Sensitivity): 56 ng/L — ABNORMAL HIGH (ref <18)

## 2022-07-20 MED ORDER — MORPHINE SULFATE (PF) 4 MG/ML IV SOLN: 4.0000 mg | Freq: Once | INTRAVENOUS | Status: DC

## 2022-07-20 MED ORDER — OXYCODONE HCL 5 MG PO TABS
5.0000 mg | ORAL_TABLET | Freq: Once | ORAL | Status: AC
  Administered 2022-07-20: 5 mg via ORAL
  Filled 2022-07-20: qty 1

## 2022-07-20 MED ORDER — ACETAMINOPHEN 500 MG PO TABS
1000.0000 mg | ORAL_TABLET | Freq: Once | ORAL | Status: AC
  Administered 2022-07-20: 1000 mg via ORAL
  Filled 2022-07-20: qty 2

## 2022-07-20 MED ORDER — TETANUS-DIPHTH-ACELL PERTUSSIS 5-2.5-18.5 LF-MCG/0.5 IM SUSY
0.5000 mL | PREFILLED_SYRINGE | Freq: Once | INTRAMUSCULAR | Status: AC
  Administered 2022-07-20: 0.5 mL via INTRAMUSCULAR
  Filled 2022-07-20: qty 0.5

## 2022-07-20 MED ORDER — SODIUM CHLORIDE 0.9 % IV BOLUS
1000.0000 mL | Freq: Once | INTRAVENOUS | Status: AC
  Administered 2022-07-20: 1000 mL via INTRAVENOUS

## 2022-07-20 NOTE — ED Notes (Signed)
Patient ambulated without incident. Reported feeling lightheaded, but walked with a steady gait.

## 2022-07-20 NOTE — ED Triage Notes (Signed)
Pt bib ems from [employer]; syncopal episode, hit head on linoleum along with r elbow; skin tear r elbow; several head pain radiating to back of neck; c collar in place; cbg 70, pt has not eaten, took 34 units of novolog today; given instant glucose pta, last cbg 122; tremors at baseline, husband states worse than normal; a and o x 4 at present; not on thinners; HR 50s, bp 154/80, 99% RA, cbg 122; pt refused IV with ems

## 2022-07-20 NOTE — ED Provider Notes (Signed)
[place] EMERGENCY DEPARTMENT AT [facility] Provider Note   CSN: [id number] Arrival date & time: 07/20/22  1726     History  No chief complaint on file.   [name] is a 72 y.o. female.  The history is provided by the patient, the spouse and medical records. No language interpreter was used.     72 year old female significant history of hypothyroidism, insulin-dependent diabetes, cardiomyopathy, migraine, IBS, CKD brought here via EMS from [facility] for evaluation of a syncopal episode.  Patient reports she was standing in line at [facility] to check out when she felt lightheadedness and dizzy and subsequently fell to the ground and apparently had a brief syncopal episode.  When EMS arrived, patient was placed on a c-collar and initial CBG was 70.  Patient states that she did not eat much today, she took her usual 34 units of NovoLog earlier today and states that when she checked her blood sugar at that time it was 130.  She was given insulin glucose by EMS and on recheck her CBG was 122.  She does have known tremor at baseline.  Patient states that she has been in her normal self leading up to this event.  She denies any new medication changes no recent sickness.  She does endorse having headache and neck pain from the fall as well as pain to her right elbow.  She denies any chest pain or shortness of breath no abdominal pain no pain to her back.  She denies any urinary symptoms.  She is unsure her last tetanus status.  She is not on any blood thinner medication.  Home Medications Prior to Admission medications   Medication Sig Start Date End Date Taking? Authorizing Provider  acetaminophen (TYLENOL) 500 MG tablet Take 500-1,000 mg by mouth every 6 (six) hours as needed for moderate pain, headache or mild pain.    [provider]  Alcohol Swabs (B-D SINGLE USE SWABS REGULAR) PADS Use as directed. 12/02/20   Eulis Foster, FNP  amLODipine (NORVASC) 10 MG tablet Take  1 tablet (10 mg total) by mouth daily. 12/02/20   Worthy Rancher B, FNP  aspirin 81 MG tablet Take 81 mg by mouth daily.    [provider]  Blood Glucose Monitoring Suppl (TRUE METRIX AIR GLUCOSE METER) w/Device KIT 1 kit by Does not apply route 3 (three) times daily. 06/14/19   Sheliah Hatch, MD  carvedilol (COREG) 6.25 MG tablet Take 0.5 tablets (3.125 mg total) by mouth 2 (two) times daily with a meal. 02/17/22   Sheliah Hatch, MD  dapagliflozin propanediol (FARXIGA) 10 MG TABS tablet Take by mouth daily.    [provider]  famotidine (PEPCID) 40 MG tablet TAKE 1 TABLET BY MOUTH EVERY DAY 06/03/21   Sheliah Hatch, MD  glipiZIDE (GLUCOTROL XL) 5 MG 24 hr tablet Take 1 tablet by mouth daily. 08/26/20   [provider]  glucose blood (TRUE METRIX BLOOD GLUCOSE TEST) test strip Use as instructed 12/02/20   Worthy Rancher B, FNP  hydrALAZINE (APRESOLINE) 50 MG tablet TAKE 1 TABLET BY MOUTH TWICE A DAY 03/11/22   Sheliah Hatch, MD  insulin NPH-regular Human (NOVOLIN 70/30) (70-30) 100 UNIT/ML injection Inject 30 Units into the skin 2 (two) times daily with a meal. 03/17/22   Sheliah Hatch, MD  levothyroxine (SYNTHROID) 100 MCG tablet Take 1 tablet (100 mcg total) by mouth daily. 12/02/20   Eulis Foster, FNP  levothyroxine (SYNTHROID)  100 MCG tablet Take 1 tablet (100 mcg total) by mouth daily. 03/17/22   Sheliah Hatch, MD  losartan (COZAAR) 25 MG tablet Take 1 tablet (25 mg total) by mouth daily. 12/02/20   Worthy Rancher B, FNP  metoCLOPramide (REGLAN) 10 MG tablet TAKE 1 TABLET FOUR TIMES DAILY 08/11/21   Sheliah Hatch, MD  pantoprazole (PROTONIX) 40 MG tablet Take 1 tablet (40 mg total) by mouth 2 (two) times daily. 12/02/20   Worthy Rancher B, FNP  polyethylene glycol (MIRALAX / GLYCOLAX) packet Take 17 g by mouth daily as needed for moderate constipation (MIX AND DRINK).    [provider]  rosuvastatin (CRESTOR) 20 MG tablet TAKE 1  TABLET AT BEDTIME 08/11/21   Sheliah Hatch, MD  sodium bicarbonate 650 MG tablet Take 650 mg by mouth 2 (two) times daily. 09/10/21   [provider]  SUMAtriptan (IMITREX) 50 MG tablet TAKE 1 TAB EVERY 2 HRS AS NEEDED FOR MIGRAINE. MAY REPEAT IN 2 HOURS IF HEADACHE PERSISTS OR RECURS. 04/28/21   Sheliah Hatch, MD  tizanidine (ZANAFLEX) 2 MG capsule Take 1 capsule (2 mg total) by mouth 3 (three) times daily as needed for muscle spasms. 03/17/22   Sheliah Hatch, MD  torsemide (DEMADEX) 20 MG tablet Take 1 tablet (20 mg total) by mouth 2 (two) times daily. 12/02/20   Worthy Rancher B, FNP  TRUEplus Lancets 33G MISC Use as directed 12/02/20   Eulis Foster, FNP  Vitamin D, Ergocalciferol, (DRISDOL) 1.25 MG (50000 UNIT) CAPS capsule Take 1 capsule (50,000 Units total) by mouth every 7 (seven) days. 06/16/22   Sheliah Hatch, MD      Allergies    Dilaudid [hydromorphone hcl], Hydromorphone, Lipitor [atorvastatin], Adhesive [tape], Ampicillin, Latex, and Penicillins    Review of Systems   Review of Systems  All other systems reviewed and are negative.   Physical Exam Updated Vital Signs BP (!) 147/108   Pulse (!) 55   Temp 98.8 F (37.1 C)   Resp 16   Ht 5\' 4"  (1.626 m)   Wt 79.8 kg   SpO2 92%   BMI 30.21 kg/m  Physical Exam Vitals and nursing note reviewed.  Constitutional:      General: She is not in acute distress.    Appearance: She is well-developed.  HENT:     Head: Normocephalic and atraumatic.     Comments: Mild tenderness noted to forehead without any bruising noted.  No raccoon's eyes, no Battle sign, no midface tenderness. Eyes:     Extraocular Movements: Extraocular movements intact.     Conjunctiva/sclera: Conjunctivae normal.     Pupils: Pupils are equal, round, and reactive to light.  Neck:     Comments: C-collar in place.  No significant midline spine tenderness. Cardiovascular:     Rate and Rhythm: Normal rate and regular rhythm.      Pulses: Normal pulses.     Heart sounds: Normal heart sounds.  Pulmonary:     Effort: Pulmonary effort is normal.  Chest:     Chest wall: No tenderness.  Abdominal:     Palpations: Abdomen is soft.     Tenderness: There is no abdominal tenderness.  Musculoskeletal:        General: Signs of injury (Right elbow: Small skin tear noted to the posterior elbow with mild tenderness to palpation but patient able to flex and extend elbow and no deformity noted.) present.     Cervical back: Neck  supple.     Comments: No midline spine tenderness no tenderness to bilateral shoulder, hips, knees or ankles.  Skin:    Findings: No rash.  Neurological:     Mental Status: She is alert and oriented to person, place, and time.  Psychiatric:        Mood and Affect: Mood normal.     ED Results / Procedures / Treatments   Labs (all labs ordered are listed, but only abnormal results are displayed) Labs Reviewed  CBG MONITORING, ED - Abnormal; Notable for the following components:      Result Value   Glucose-Capillary 146 (*)    All other components within normal limits  BASIC METABOLIC PANEL  CBC  URINALYSIS, ROUTINE W REFLEX MICROSCOPIC  TROPONIN I (HIGH SENSITIVITY)    EKG None ED ECG REPORT   Date: 07/20/2022  Rate: 54  Rhythm: sinus bradycardia  QRS Axis: left  Intervals: PR shortened  ST/T Wave abnormalities: normal  Conduction Disutrbances:nonspecific intraventricular conduction delay  Narrative Interpretation:   Old EKG Reviewed: unchanged  I have personally reviewed the EKG tracing and agree with the computerized printout as noted.   Radiology DG Elbow 2 Views Right  Result Date: 07/20/2022 CLINICAL DATA:  Syncope, fall EXAM: RIGHT ELBOW - 2 VIEW COMPARISON:  None Available. FINDINGS: No displaced fracture or dislocation is seen. There are small linear calcific densities at the attachment of triceps to the olecranon process and adjacent to the medial epicondyle of distal  humerus. This finding may suggest calcific bursitis or tendinosis. There is no displacement of posterior fat pad. There is soft tissue swelling over the olecranon process. IMPRESSION: No recent fracture or dislocation is seen in right elbow. Small linear calcifications are noted in soft tissues adjacent to the olecranon process and medial epicondyle of distal humerus suggesting calcific tendinosis. Electronically Signed   By: Ernie Avena M.D.   On: 07/20/2022 18:23    Procedures Procedures    Medications Ordered in ED Medications  morphine (PF) 4 MG/ML injection 4 mg (has no administration in time range)  Tdap (BOOSTRIX) injection 0.5 mL (has no administration in time range)  sodium chloride 0.9 % bolus 1,000 mL (has no administration in time range)    ED Course/ Medical Decision Making/ A&P Clinical Course as of 07/20/22 1813  Wed Jul 20, 2022  1756 Stable, Syncope while shopping. BG was fine earlier. Took her 34 u insulin. Had prodrome. EMS gave dextrose because low on their arrival.  [CC]  1804 DG Elbow 2 Views Right [CC]    Clinical Course User Index [CC] Glyn Ade, MD                             Medical Decision Making Amount and/or Complexity of Data Reviewed Labs: ordered. Radiology: ordered. Decision-making details documented in ED Course.  Risk Prescription drug management.   BP (!) 147/108   Pulse (!) 55   Temp 98.8 F (37.1 C)   Resp 16   Ht 5\' 4"  (1.626 m)   Wt 79.8 kg   SpO2 92%   BMI 30.21 kg/m   8:29 PM 72 year old female significant history of hypothyroidism, insulin-dependent diabetes, cardiomyopathy, migraine, IBS, CKD brought here via EMS from [facility] for evaluation of a syncopal episode.  Patient reports she was standing in line at [facility] to check out when she felt lightheadedness and dizzy and subsequently fell to the ground and apparently had a brief  syncopal episode.  When EMS arrived, patient was placed on a c-collar and initial  CBG was 70.  Patient states that she did not eat much today, she took her usual 34 units of NovoLog earlier today and states that when she checked her blood sugar at that time it was 130.  She was given insulin glucose by EMS and on recheck her CBG was 122.  She does have known tremor at baseline.  Patient states that she has been in her normal self leading up to this event.  She denies any new medication changes no recent sickness.  She does endorse having headache and neck pain from the fall as well as pain to her right elbow.  She denies any chest pain or shortness of breath no abdominal pain no pain to her back.  She denies any urinary symptoms.  She is unsure her last tetanus status.  She is not on any blood thinner medication.  On exam this is an obese female laying in bed in no acute discomfort.  She is wearing a cervical spine collar.  She has a mild tenderness noted to her forehead but no obvious deformity.  No significant scalp tenderness no midface tenderness.  She does not have any midline spine tenderness.  She does have some mild tenderness noted to her right elbow with a small skin tear not actively bleeding.  She is able to flex and extend the elbow.  No tenderness to bilateral shoulders, wrists, hips, knees or ankles.  -Labs ordered, independently viewed and interpreted by me.  Labs remarkable for CBG 146.  The remainder of the labs are currently pending -The patient was maintained on a cardiac monitor.  I personally viewed and interpreted the cardiac monitored which showed an underlying rhythm of: sinus bradycardia -Imaging independently viewed and interpreted by me and I agree with radiologist's interpretation.  Result remarkable for xray of R elbow without acute injury.  Head and cspine CT is currently pending -This patient presents to the ED for concern of syncope, this involves an extensive number of treatment options, and is a complaint that carries with it a high risk of complications  and morbidity.  The differential diagnosis includes vasovagal syncope, hypoglycemia, anemia, acs, stroke, hypovolemia, cardiac arrhythmia -Co morbidities that complicate the patient evaluation includes CHF, DM, CKD, HTN -Treatment includes tdap, IVF, glucose, morphine -Reevaluation of the patient after these medicines showed that the patient improved -PCP office notes or outside notes reviewed -Discussion with attending Dr. Doran Durandountryman who will f/u on CT result and labs and determine disposition           Final Clinical Impression(s) / ED Diagnoses Final diagnoses:  None    Rx / DC Orders ED Discharge Orders     None         Fayrene Helperran, Hazleigh Mccleave, PA-C 07/20/22 1841    Glyn Adeountryman, Chase, MD 07/23/22 [phone]

## 2022-07-21 ENCOUNTER — Observation Stay (HOSPITAL_BASED_OUTPATIENT_CLINIC_OR_DEPARTMENT_OTHER): Payer: Medicare HMO

## 2022-07-21 ENCOUNTER — Other Ambulatory Visit: Payer: Self-pay | Admitting: Cardiology

## 2022-07-21 DIAGNOSIS — I5032 Chronic diastolic (congestive) heart failure: Secondary | ICD-10-CM | POA: Diagnosis not present

## 2022-07-21 DIAGNOSIS — Z794 Long term (current) use of insulin: Secondary | ICD-10-CM | POA: Diagnosis not present

## 2022-07-21 DIAGNOSIS — R55 Syncope and collapse: Secondary | ICD-10-CM | POA: Diagnosis not present

## 2022-07-21 DIAGNOSIS — E890 Postprocedural hypothyroidism: Secondary | ICD-10-CM

## 2022-07-21 DIAGNOSIS — I10 Essential (primary) hypertension: Secondary | ICD-10-CM

## 2022-07-21 DIAGNOSIS — E1122 Type 2 diabetes mellitus with diabetic chronic kidney disease: Secondary | ICD-10-CM | POA: Diagnosis not present

## 2022-07-21 DIAGNOSIS — N184 Chronic kidney disease, stage 4 (severe): Secondary | ICD-10-CM

## 2022-07-21 DIAGNOSIS — R001 Bradycardia, unspecified: Secondary | ICD-10-CM

## 2022-07-21 LAB — ECHOCARDIOGRAM COMPLETE
AR max vel: 2.14 cm2
AV Area VTI: 2.27 cm2
AV Area mean vel: 1.91 cm2
AV Mean grad: 6 mmHg
AV Peak grad: 10.5 mmHg
AV Vena cont: 0.1 cm
Ao pk vel: 1.62 m/s
Area-P 1/2: 2.77 cm2
Height: 64 in
S' Lateral: 2.7 cm
Weight: 2816 oz

## 2022-07-21 LAB — GLUCOSE, CAPILLARY
Glucose-Capillary: 160 mg/dL — ABNORMAL HIGH (ref 70–99)
Glucose-Capillary: 143 mg/dL — ABNORMAL HIGH (ref 70–99)

## 2022-07-21 LAB — CBG MONITORING, ED
Glucose-Capillary: 82 mg/dL (ref 70–99)
Glucose-Capillary: 102 mg/dL — ABNORMAL HIGH (ref 70–99)

## 2022-07-21 MED ORDER — TORSEMIDE 20 MG PO TABS
20.0000 mg | ORAL_TABLET | Freq: Two times a day (BID) | ORAL | Status: DC
  Administered 2022-07-21 – 2022-07-22 (×3): 20 mg via ORAL
  Filled 2022-07-21 (×3): qty 1

## 2022-07-21 MED ORDER — INSULIN ASPART 100 UNIT/ML IJ SOLN
0.0000 [IU] | Freq: Three times a day (TID) | INTRAMUSCULAR | Status: DC
  Administered 2022-07-21 – 2022-07-22 (×2): 2 [IU] via SUBCUTANEOUS

## 2022-07-21 MED ORDER — ONDANSETRON HCL 4 MG/2ML IJ SOLN: 4.0000 mg | Freq: Four times a day (QID) | INTRAMUSCULAR | Status: DC | PRN

## 2022-07-21 MED ORDER — PANTOPRAZOLE SODIUM 40 MG PO TBEC
40.0000 mg | DELAYED_RELEASE_TABLET | Freq: Two times a day (BID) | ORAL | Status: DC
  Administered 2022-07-21 – 2022-07-22 (×3): 40 mg via ORAL
  Filled 2022-07-21 (×3): qty 1

## 2022-07-21 MED ORDER — INSULIN ASPART 100 UNIT/ML IJ SOLN: 0.0000 [IU] | Freq: Every day | INTRAMUSCULAR | Status: DC

## 2022-07-21 MED ORDER — POLYETHYLENE GLYCOL 3350 17 G PO PACK
17.0000 g | PACK | Freq: Every day | ORAL | Status: DC | PRN
  Administered 2022-07-22 (×2): 17 g via ORAL
  Filled 2022-07-21 (×2): qty 1

## 2022-07-21 MED ORDER — LEVOTHYROXINE SODIUM 100 MCG PO TABS
100.0000 ug | ORAL_TABLET | Freq: Every day | ORAL | Status: DC
  Administered 2022-07-21 – 2022-07-22 (×2): 100 ug via ORAL
  Filled 2022-07-21 (×2): qty 1

## 2022-07-21 MED ORDER — ROSUVASTATIN CALCIUM 20 MG PO TABS
20.0000 mg | ORAL_TABLET | Freq: Every day | ORAL | Status: DC
  Administered 2022-07-21: 20 mg via ORAL
  Filled 2022-07-21: qty 1

## 2022-07-21 MED ORDER — AMLODIPINE BESYLATE 10 MG PO TABS
10.0000 mg | ORAL_TABLET | Freq: Every day | ORAL | Status: DC
  Administered 2022-07-21 – 2022-07-22 (×2): 10 mg via ORAL
  Filled 2022-07-21: qty 2
  Filled 2022-07-21: qty 1

## 2022-07-21 MED ORDER — DAPAGLIFLOZIN PROPANEDIOL 10 MG PO TABS
10.0000 mg | ORAL_TABLET | Freq: Every day | ORAL | Status: DC
  Administered 2022-07-21 – 2022-07-22 (×2): 10 mg via ORAL
  Filled 2022-07-21 (×2): qty 1

## 2022-07-21 MED ORDER — HYDRALAZINE HCL 50 MG PO TABS
50.0000 mg | ORAL_TABLET | Freq: Two times a day (BID) | ORAL | Status: DC
  Administered 2022-07-21 – 2022-07-22 (×3): 50 mg via ORAL
  Filled 2022-07-21 (×2): qty 1
  Filled 2022-07-21: qty 2

## 2022-07-21 MED ORDER — ACETAMINOPHEN 650 MG RE SUPP: 650.0000 mg | Freq: Four times a day (QID) | RECTAL | Status: DC | PRN

## 2022-07-21 MED ORDER — HYDRALAZINE HCL 20 MG/ML IJ SOLN: 10.0000 mg | Freq: Four times a day (QID) | INTRAMUSCULAR | Status: DC | PRN

## 2022-07-21 MED ORDER — ONDANSETRON HCL 4 MG PO TABS: 4.0000 mg | ORAL_TABLET | Freq: Four times a day (QID) | ORAL | Status: DC | PRN

## 2022-07-21 MED ORDER — LOSARTAN POTASSIUM 25 MG PO TABS
25.0000 mg | ORAL_TABLET | Freq: Every day | ORAL | Status: DC
  Administered 2022-07-21 – 2022-07-22 (×2): 25 mg via ORAL
  Filled 2022-07-21 (×2): qty 1

## 2022-07-21 MED ORDER — HEPARIN SODIUM (PORCINE) 5000 UNIT/ML IJ SOLN
5000.0000 [IU] | Freq: Three times a day (TID) | INTRAMUSCULAR | Status: DC
  Administered 2022-07-21 – 2022-07-22 (×4): 5000 [IU] via SUBCUTANEOUS
  Filled 2022-07-21 (×4): qty 1

## 2022-07-21 MED ORDER — ACETAMINOPHEN 325 MG PO TABS
650.0000 mg | ORAL_TABLET | Freq: Four times a day (QID) | ORAL | Status: DC | PRN
  Administered 2022-07-22: 650 mg via ORAL
  Filled 2022-07-21: qty 2

## 2022-07-21 MED ORDER — SODIUM BICARBONATE 650 MG PO TABS
650.0000 mg | ORAL_TABLET | Freq: Two times a day (BID) | ORAL | Status: DC
  Administered 2022-07-21 – 2022-07-22 (×3): 650 mg via ORAL
  Filled 2022-07-21 (×3): qty 1

## 2022-07-21 NOTE — TOC CAGE-AID Note (Signed)
Transition of Care [facility]) - CAGE-AID Screening   Patient Details  Name: SYRAH DINNEEN MRN: [id number] Date of Birth: 09/21/1950  Transition of Care [facility]) CM/SW Contact:    Katha Hamming, RN Phone Number:[phone] 07/21/2022, 7:22 PM   CAGE-AID Screening:    Have You Ever Felt You Ought to Cut Down on Your Drinking or Drug Use?: No Have People Annoyed You By Critizing Your Drinking Or Drug Use?: No Have You Felt Bad Or Guilty About Your Drinking Or Drug Use?: No Have You Ever Had a Drink or Used Drugs First Thing In The Morning to Steady Your Nerves or to Get Rid of a Hangover?: No CAGE-AID Score: 0  Substance Abuse Education Offered: No (no hx drug/alcohol abuse)

## 2022-07-21 NOTE — Assessment & Plan Note (Addendum)
Chronic. Stable. Not volume overloaded at this point. Holding coreg due to bradycardia

## 2022-07-21 NOTE — Assessment & Plan Note (Signed)
HR in the 40-50s. Will hold coreg. TSH last month was 1.72

## 2022-07-21 NOTE — Progress Notes (Signed)
NT performed orthostatic vital signs with patient at the bedside; orthostatic vital signs as follow:   Lying BP: 187/62 Lying HR: 99  Sitting BP: 137/123 Sitting HR: 55  Standing BP at 0 mins: 126/67 Standing HR at 0 mins: 83  Unable to perform standing BP at 3 mins.

## 2022-07-21 NOTE — Addendum Note (Signed)
Addended by: Perlie Gold on: 07/21/2022 04:17 PM   Modules accepted: Orders

## 2022-07-21 NOTE — ED Notes (Signed)
RN changed pt linens and brief

## 2022-07-21 NOTE — Assessment & Plan Note (Signed)
Observation card/tele bed. Unclear the cause. Multifactorial. Could be due to low blood sugar. Pt states she ate 2 scrambled eggs for breakfast but did not eat lunch prior to going to [employer]. She did take her 34 units of 70/30 insulin this AM. BS at time of syncope was reported 70. Pt also has persistent bradycardia with HR in the 40-50s. Only on coreg 3.125 mg bid. Will stop coreg. Check echo. She will need to be ambulated prior to discharge to see if her symptoms can be reproduced.

## 2022-07-21 NOTE — Assessment & Plan Note (Signed)
Need home med rec completed. Holding coreg due to bradycardia.

## 2022-07-21 NOTE — H&P (Signed)
History and Physical    HALEI SINEATH DVV:[id number] DOB: Jul 17, 1950 DOA: 07/20/2022  DOS: the patient was seen and examined on 07/20/2022  PCP: Sheliah Hatch, MD   Patient coming from:  [employer]  I have personally briefly reviewed patient's old medical records in [facility]  CC: syncope 72 yo AAF with hx of DM2, HTN, CKD stage 4, hyperlipidemia, chronic diastolic CHF, presents to ER after syncope at [facility] today.  Patient states that she was standing in line at checkout.  She states that she felt very lightheaded.  She states that her vision started going black.  She states that she fell to the floor and hit her head.  Reported that her blood glucose was 70 at the time of her single episode.  Patient states that she did take 34 units of 70/30 insulin this morning.  She had 2 scrambled eggs.  She did not eat lunch.  She went to [facility] around 2 PM.  After she passed out, EMS was called.  She was given some glucose tabs.  Heart rate was in the 50s.  Patient transferred to the ER for.  On arrival temp 98.8 heart rate 55 blood pressure 147/108 satting 92% on room air.  Labs showed a white count of 10.5, hemoglobin of 11.6, platelets of 317  Sodium 140, potassium 4.4, chloride of 106, BUN of 22, creatinine 1.6, glucose of 81  CT head demonstrates no acute intracranial findings.  CT C-spine negative for acute fractures.  Prior fusion of C4 and C5.  Right elbow x-ray negative for fracture.  EKG on my interpretation showed sinus bradycardia with a heart rate of 54.  Due to the patient's unexplained syncopal episode, [facility] hospitalist contacted for admission.   ED Course: CT head negative. EKG shows sinus bradycardia  Review of Systems:  Review of Systems  Constitutional: Negative.   HENT: Negative.    Eyes: Negative.   Respiratory: Negative.    Cardiovascular:  Negative for chest pain, palpitations, orthopnea and PND.       Syncope  Gastrointestinal: Negative.    Genitourinary: Negative.   Musculoskeletal: Negative.   Skin: Negative.   Neurological:        Syncope. Tunnel vision. Turned black when she passed out.  Endo/Heme/Allergies: Negative.   Psychiatric/Behavioral: Negative.    All other systems reviewed and are negative.   Past Medical History:  Diagnosis Date   Anginal pain 03/19/2012   saw Dr. Melburn Popper .. she thinks its more related to stomach issues   Arthritis    "all over; mostly in my back" (03/20/2012)   Breast cancer    "right" (03/20/2012)   Cardiomyopathy    Chest pain 06/2005   Hospitalized, dystolic dysfunction   Chronic lower back pain    "have 2 herniated discs; going to have to have a fusion" (03/20/2012)   CKD (chronic kidney disease), stage III    Family history of anesthesia complication    "father has nausea and vomiting"   Gallstones    GERD (gastroesophageal reflux disease)    H/O hiatal hernia    History of kidney stones    History of right mastectomy    Hypertension    "patient states never had HTN, takes Hyzaar for heart   Hypothyroidism    Irritable bowel syndrome    Migraines    "it's been a long time since I've had one" (03/20/2012)   Osteoarthritis    Peripheral neuropathy    PONV (postoperative nausea and  vomiting)    Sciatica    Type II diabetes mellitus     Past Surgical History:  Procedure Laterality Date   ABDOMINAL HYSTERECTOMY  1993   adenosin cardiolite  07/2005   (+) wall motion abnormality   AXILLARY LYMPH NODE DISSECTION  2003   squamous cell cancer   BACK SURGERY     CARDIAC CATHETERIZATION  ? 2005 / 2007   CHOLECYSTECTOMY N/A 01/24/2018   Procedure: LAPAROSCOPIC CHOLECYSTECTOMY;  Surgeon: Harriette Bouillon, MD;  Location: [facility] OR;  Service: General;  Laterality: N/A;   CT abdomen and pelvis  02/2010   Same   ESOPHAGOGASTRODUODENOSCOPY  2005   GERD   KNEE ARTHROSCOPY  1990's   "right" (03/20/2012)   LUMBAR FUSION N/A 08/22/2014   Procedure: Right L2-3 and right L1-2  transforaminal lumbar interbody fusion with pedicle screws, rods, sleeves, cages, local bone graft, Vivigen, cancellous chips;  Surgeon: Kerrin Champagne, MD;  Location: [facility] OR;  Service: Orthopedics;  Laterality: N/A;   LUMBAR LAMINECTOMY/DECOMPRESSION MICRODISCECTOMY N/A 02/04/2014   Procedure: RIGHT L2-3 MICRODISCECTOMY ;  Surgeon: Kerrin Champagne, MD;  Location: [facility] OR;  Service: Orthopedics;  Laterality: N/A;   MASTECTOMY Right    MASTECTOMY WITH AXILLARY LYMPH NODE DISSECTION  12/2003   "right" (03/20/2012)   POSTERIOR CERVICAL FUSION/FORAMINOTOMY  2001   SHOULDER ARTHROSCOPY W/ ROTATOR CUFF REPAIR  2003   Left   THYROIDECTOMY  1977   Right   TOTAL KNEE ARTHROPLASTY  2000   Right   [place] of abdomen  07/2005   Fatty liver / kidney stones     reports that she has never smoked. She has never used smokeless tobacco. She reports that she does not drink alcohol and does not use drugs.  Allergies  Allergen Reactions   Dilaudid [Hydromorphone Hcl] Nausea And Vomiting   Hydromorphone Nausea And Vomiting   Lipitor [Atorvastatin] Other (See Comments)    Body & Muscle Aches   Adhesive [Tape] Rash   Ampicillin Rash and Other (See Comments)    Also developed welts on her hands when she took it in the hospital   Latex Itching, Dermatitis, Rash and Other (See Comments)    Patient had a reaction to tape after surgery and they told her she was allergic to Latex    Penicillins Rash and Other (See Comments)    Has patient had a PCN reaction causing immediate rash, facial/tongue/throat swelling, SOB or lightheadedness with hypotension: No Has patient had a PCN reaction causing severe rash involving mucus membranes or skin necrosis: No Has patient had a PCN reaction that required hospitalization: Yes - in hospital receiving treatment Has patient had a PCN reaction occurring within the last 10 years: No If all of the above answers are "NO", then may proceed with Cephalosporin use.     Family History   Problem Relation Age of Onset   Hypertension Mother    Breast cancer Mother 81   Diabetes Father    Hypertension Father    Hypertension Sister    Hypertension Brother    Hypertension Brother    Diabetes Brother    Diabetes Brother    Diabetes Other        Family Hx. of Diabetes   Hypertension Other        Family History of HTN   Deep vein thrombosis Daughter    Breast cancer Maternal Aunt    Colon cancer Neg Hx    Esophageal cancer Neg Hx    Liver cancer  Neg Hx    Rectal cancer Neg Hx    Stomach cancer Neg Hx     Prior to Admission medications   Medication Sig Start Date End Date Taking? Authorizing Provider  acetaminophen (TYLENOL) 500 MG tablet Take 500-1,000 mg by mouth every 6 (six) hours as needed for moderate pain, headache or mild pain.    [provider]  Alcohol Swabs (B-D SINGLE USE SWABS REGULAR) PADS Use as directed. 12/02/20   Eulis Foster, FNP  amLODipine (NORVASC) 10 MG tablet Take 1 tablet (10 mg total) by mouth daily. 12/02/20   Worthy Rancher B, FNP  aspirin 81 MG tablet Take 81 mg by mouth daily.    [provider]  Blood Glucose Monitoring Suppl (TRUE METRIX AIR GLUCOSE METER) w/Device KIT 1 kit by Does not apply route 3 (three) times daily. 06/14/19   Sheliah Hatch, MD  carvedilol (COREG) 6.25 MG tablet Take 0.5 tablets (3.125 mg total) by mouth 2 (two) times daily with a meal. 02/17/22   Sheliah Hatch, MD  dapagliflozin propanediol (FARXIGA) 10 MG TABS tablet Take by mouth daily.    [provider]  famotidine (PEPCID) 40 MG tablet TAKE 1 TABLET BY MOUTH EVERY DAY 06/03/21   Sheliah Hatch, MD  glipiZIDE (GLUCOTROL XL) 5 MG 24 hr tablet Take 1 tablet by mouth daily. 08/26/20   [provider]  glucose blood (TRUE METRIX BLOOD GLUCOSE TEST) test strip Use as instructed 12/02/20   Worthy Rancher B, FNP  hydrALAZINE (APRESOLINE) 50 MG tablet TAKE 1 TABLET BY MOUTH TWICE A DAY 03/11/22   Sheliah Hatch, MD   insulin NPH-regular Human (NOVOLIN 70/30) (70-30) 100 UNIT/ML injection Inject 30 Units into the skin 2 (two) times daily with a meal. 03/17/22   Sheliah Hatch, MD  levothyroxine (SYNTHROID) 100 MCG tablet Take 1 tablet (100 mcg total) by mouth daily. 12/02/20   Eulis Foster, FNP  levothyroxine (SYNTHROID) 100 MCG tablet Take 1 tablet (100 mcg total) by mouth daily. 03/17/22   Sheliah Hatch, MD  losartan (COZAAR) 25 MG tablet Take 1 tablet (25 mg total) by mouth daily. 12/02/20   Worthy Rancher B, FNP  metoCLOPramide (REGLAN) 10 MG tablet TAKE 1 TABLET FOUR TIMES DAILY 08/11/21   Sheliah Hatch, MD  pantoprazole (PROTONIX) 40 MG tablet Take 1 tablet (40 mg total) by mouth 2 (two) times daily. 12/02/20   Worthy Rancher B, FNP  polyethylene glycol (MIRALAX / GLYCOLAX) packet Take 17 g by mouth daily as needed for moderate constipation (MIX AND DRINK).    [provider]  rosuvastatin (CRESTOR) 20 MG tablet TAKE 1 TABLET AT BEDTIME 08/11/21   Sheliah Hatch, MD  sodium bicarbonate 650 MG tablet Take 650 mg by mouth 2 (two) times daily. 09/10/21   [provider]  SUMAtriptan (IMITREX) 50 MG tablet TAKE 1 TAB EVERY 2 HRS AS NEEDED FOR MIGRAINE. MAY REPEAT IN 2 HOURS IF HEADACHE PERSISTS OR RECURS. 04/28/21   Sheliah Hatch, MD  tizanidine (ZANAFLEX) 2 MG capsule Take 1 capsule (2 mg total) by mouth 3 (three) times daily as needed for muscle spasms. 03/17/22   Sheliah Hatch, MD  torsemide (DEMADEX) 20 MG tablet Take 1 tablet (20 mg total) by mouth 2 (two) times daily. 12/02/20   Worthy Rancher B, FNP  TRUEplus Lancets 33G MISC Use as directed 12/02/20   Eulis Foster, FNP  Vitamin D, Ergocalciferol, (DRISDOL) 1.25 MG (50000 UNIT)  CAPS capsule Take 1 capsule (50,000 Units total) by mouth every 7 (seven) days. 06/16/22   Sheliah Hatchabori, Katherine E, MD    Physical Exam: Vitals:   07/20/22 2115 07/20/22 2146 07/20/22 2200 07/20/22 2300  BP: (!) 200/61  (!) 181/57 (!) 182/55   Pulse: (!) 57  (!) 48 (!) 50  Resp: 16  20 17   Temp:  99 F (37.2 C)    TempSrc:  Oral    SpO2: 90%  94% 94%  Weight:      Height:        Physical Exam Vitals and nursing note reviewed.  Constitutional:      General: She is not in acute distress.    Appearance: Normal appearance. She is not ill-appearing, toxic-appearing or diaphoretic.  HENT:     Head: Normocephalic and atraumatic.     Nose: Nose normal.  Eyes:     General: No scleral icterus. Cardiovascular:     Rate and Rhythm: Regular rhythm. Bradycardia present.  Pulmonary:     Effort: Pulmonary effort is normal.     Breath sounds: Normal breath sounds.  Abdominal:     General: Bowel sounds are normal. There is no distension.     Palpations: Abdomen is soft.     Tenderness: There is no abdominal tenderness.  Musculoskeletal:     Right lower leg: No edema.     Left lower leg: No edema.  Skin:    General: Skin is warm and dry.     Capillary Refill: Capillary refill takes less than 2 seconds.     Comments: Small abrasion right elbow  Neurological:     General: No focal deficit present.     Mental Status: She is alert.      Labs on Admission: I have personally reviewed following labs and imaging studies  CBC: Recent Labs  Lab 07/20/22 2034  WBC 10.5  HGB 11.6*  HCT 36.7  MCV 91.8  PLT 317   Basic Metabolic Panel: Recent Labs  Lab 07/20/22 2034  NA 140  K 4.4  CL 106  CO2 24  GLUCOSE 81  BUN 22  CREATININE 1.63*  CALCIUM 10.4*   GFR: Estimated Creatinine Clearance: 31.4 mL/min (A) (by C-G formula based on SCr of 1.63 mg/dL (H)). Liver Function Tests: No results for input(s): "AST", "ALT", "ALKPHOS", "BILITOT", "PROT", "ALBUMIN" in the last 168 hours. No results for input(s): "LIPASE", "AMYLASE" in the last 168 hours. No results for input(s): "AMMONIA" in the last 168 hours. Coagulation Profile: No results for input(s): "INR", "PROTIME" in the last 168 hours. Cardiac Enzymes: Recent Labs   Lab 07/20/22 2034 07/20/22 2141  TROPONINIHS 49* 56*   BNP (last 3 results) No results for input(s): "BNP" in the last 8760 hours. HbA1C: No results for input(s): "HGBA1C" in the last 72 hours. CBG: Recent Labs  Lab 07/20/22 1741 07/20/22 1943  GLUCAP 146* 83   Lipid Profile: No results for input(s): "CHOL", "HDL", "LDLCALC", "TRIG", "CHOLHDL", "LDLDIRECT" in the last 72 hours. Thyroid Function Tests: No results for input(s): "TSH", "T4TOTAL", "FREET4", "T3FREE", "THYROIDAB" in the last 72 hours. Anemia Panel: No results for input(s): "VITAMINB12", "FOLATE", "FERRITIN", "TIBC", "IRON", "RETICCTPCT" in the last 72 hours. Urine analysis:    Component Value Date/Time   COLORURINE STRAW (A) 07/20/2022 2216   APPEARANCEUR CLEAR 07/20/2022 2216   LABSPEC 1.006 07/20/2022 2216   PHURINE 7.0 07/20/2022 2216   GLUCOSEU >=500 (A) 07/20/2022 2216   HGBUR SMALL (A) 07/20/2022 2216  HGBUR negative 02/25/2010 1034   BILIRUBINUR NEGATIVE 07/20/2022 2216   KETONESUR NEGATIVE 07/20/2022 2216   PROTEINUR 100 (A) 07/20/2022 2216   UROBILINOGEN 0.2 09/13/2014 1725   NITRITE NEGATIVE 07/20/2022 2216   LEUKOCYTESUR SMALL (A) 07/20/2022 2216    Radiological Exams on Admission: I have personally reviewed images CT Cervical Spine Wo Contrast  Result Date: 07/20/2022 CLINICAL DATA:  Status post trauma. EXAM: CT CERVICAL SPINE WITHOUT CONTRAST TECHNIQUE: Multidetector CT imaging of the cervical spine was performed without intravenous contrast. Multiplanar CT image reconstructions were also generated. RADIATION DOSE REDUCTION: This exam was performed according to the departmental dose-optimization program which includes automated exposure control, adjustment of the mA and/or kV according to patient size and/or use of iterative reconstruction technique. COMPARISON:  None Available. FINDINGS: Alignment: Normal. Skull base and vertebrae: No acute fracture. A metallic density fusion plate and screws  are seen along the anterior aspects of the C4 and C5 vertebral bodies. Soft tissues and spinal canal: No prevertebral fluid or swelling. No visible canal hematoma. Disc levels: Moderate to marked severity endplate sclerosis, marked severity anterior osteophyte formation and posterior bony spurring are seen at the levels of C2-C3, C3-C4, C4-C5, C5-C6 and C6-C7. Extensive calcification of the anterior longitudinal ligament is also present. There is marked severity narrowing of the anterior atlantoaxial articulation. There is surgical fusion of the C4-C5 level with moderate to marked severity intervertebral disc space narrowing seen at the levels of C5-C6 C6-C7 and C7-T1. Moderate severity, predominately posterior intervertebral disc space narrowing is seen at C2-C3 and C3-C4. Bilateral marked severity multilevel facet joint hypertrophy is noted. Upper chest: Negative. Other: None. IMPRESSION: 1. No acute fracture or subluxation in the cervical spine. 2. Marked severity multilevel degenerative changes, as described above. 3. Prior surgical fusion of the C4-C5 level. Electronically Signed   By: Aram Candela M.D.   On: 07/20/2022 18:55   CT HEAD WO CONTRAST  Result Date: 07/20/2022 CLINICAL DATA:  Status post trauma. EXAM: CT HEAD WITHOUT CONTRAST TECHNIQUE: Contiguous axial images were obtained from the base of the skull through the vertex without intravenous contrast. RADIATION DOSE REDUCTION: This exam was performed according to the departmental dose-optimization program which includes automated exposure control, adjustment of the mA and/or kV according to patient size and/or use of iterative reconstruction technique. COMPARISON:  October 19, 2019 FINDINGS: Brain: There is mild cerebral atrophy with widening of the extra-axial spaces and ventricular dilatation. There are areas of decreased attenuation within the white matter tracts of the supratentorial brain, consistent with microvascular disease changes.  Vascular: No hyperdense vessel or unexpected calcification. Skull: Normal. Negative for fracture or focal lesion. Sinuses/Orbits: No acute finding. Other: Mild scalp soft tissue swelling is seen along the posterior aspect of the vertex on the left. IMPRESSION: 1. No acute intracranial abnormality. 2. Generalized cerebral atrophy and microvascular disease changes of the supratentorial brain. Electronically Signed   By: Aram Candela M.D.   On: 07/20/2022 18:52   DG Elbow 2 Views Right  Result Date: 07/20/2022 CLINICAL DATA:  Syncope, fall EXAM: RIGHT ELBOW - 2 VIEW COMPARISON:  None Available. FINDINGS: No displaced fracture or dislocation is seen. There are small linear calcific densities at the attachment of triceps to the olecranon process and adjacent to the medial epicondyle of distal humerus. This finding may suggest calcific bursitis or tendinosis. There is no displacement of posterior fat pad. There is soft tissue swelling over the olecranon process. IMPRESSION: No recent fracture or dislocation is seen in right  elbow. Small linear calcifications are noted in soft tissues adjacent to the olecranon process and medial epicondyle of distal humerus suggesting calcific tendinosis. Electronically Signed   By: Ernie Avena M.D.   On: 07/20/2022 18:23    EKG: My personal interpretation of EKG shows: sinus bradycardia    Assessment/Plan Principal Problem:   Syncope Active Problems:   Sinus bradycardia   Chronic diastolic heart failure   Type 2 diabetes mellitus with diabetic chronic kidney disease   Essential hypertension   Postsurgical hypothyroidism   Chronic renal impairment, stage 4 (severe)   Hyperlipidemia associated with type 2 diabetes mellitus    Assessment and Plan: * Syncope Observation card/tele bed. Unclear the cause. Multifactorial. Could be due to low blood sugar. Pt states she ate 2 scrambled eggs for breakfast but did not eat lunch prior to going to [employer]. She did  take her 34 units of 70/30 insulin this AM. BS at time of syncope was reported 70. Pt also has persistent bradycardia with HR in the 40-50s. Only on coreg 3.125 mg bid. Will stop coreg. Check echo. She will need to be ambulated prior to discharge to see if her symptoms can be reproduced.  Sinus bradycardia HR in the 40-50s. Will hold coreg. TSH last month was 1.72  Hyperlipidemia associated with type 2 diabetes mellitus Stable.  Chronic renal impairment, stage 4 (severe) Chronic. Follows with Dr. Malen Gauze with [facility] as outpatient.  Postsurgical hypothyroidism Stable. TSH 1.7 last month  Essential hypertension Need home med rec completed. Holding coreg due to bradycardia.  Type 2 diabetes mellitus with diabetic chronic kidney disease A1c 8.4% last month. Given BS of 70 around time of syncope, will hold 70/30 insulin. Add SSI for now.   Chronic diastolic heart failure Chronic. Stable. Not volume overloaded at this point. Holding coreg due to bradycardia    DVT prophylaxis: SQ Heparin Code Status: Full Code Family Communication: no family at bedside  Disposition Plan: return home  Consults called: none  Admission status: Observation, Telemetry bed   Carollee Herter, DO [facility] 07/21/2022, 12:52 AM

## 2022-07-21 NOTE — Assessment & Plan Note (Signed)
Chronic. Follows with Dr. Malen Gauze with [facility] as outpatient.

## 2022-07-21 NOTE — Progress Notes (Addendum)
No charge note: Patient admitted for syncope this a.m., detail please see HPI.  Syncope : CT head cervical spine no acute findings, echocardiogram with preserved LVEF ,pending orthostatic vital sign,  Sinus bradycardia, hold Coreg, TSH 1.7, cardiology consulted who recommended 30-day event monitor, continue hold Coreg until follow-up with cardiology outpatient  Right elbow skin tear, s/p tdap in the ED  Right elbow x-ray : no recent fracture or dislocation is seen in right elbow. Small linear calcifications are noted in soft tissues adjacent to the olecranon process and medial epicondyle of distal humerus suggesting calcific tendinosis.

## 2022-07-21 NOTE — Assessment & Plan Note (Signed)
Stable. TSH 1.7 last month

## 2022-07-21 NOTE — Assessment & Plan Note (Signed)
A1c 8.4% last month. Given BS of 70 around time of syncope, will hold 70/30 insulin. Add SSI for now.

## 2022-07-21 NOTE — Assessment & Plan Note (Signed)
Stable

## 2022-07-21 NOTE — Progress Notes (Signed)
   30 day event monitor per Dr. Eden Emms for syncope evaluation. Results to Dr. Jens Som.  Perlie Gold, PA-C

## 2022-07-21 NOTE — Subjective & Objective (Signed)
CC: syncope 72 yo AAF with hx of DM2, HTN, CKD stage 4, hyperlipidemia, chronic diastolic CHF, presents to ER after syncope at [facility] today.  Patient states that she was standing in line at checkout.  She states that she felt very lightheaded.  She states that her vision started going black.  She states that she fell to the floor and hit her head.  Reported that her blood glucose was 70 at the time of her single episode.  Patient states that she did take 34 units of 70/30 insulin this morning.  She had 2 scrambled eggs.  She did not eat lunch.  She went to [facility] around 2 PM.  After she passed out, EMS was called.  She was given some glucose tabs.  Heart rate was in the 50s.  Patient transferred to the ER for.  On arrival temp 98.8 heart rate 55 blood pressure 147/108 satting 92% on room air.  Labs showed a white count of 10.5, hemoglobin of 11.6, platelets of 317  Sodium 140, potassium 4.4, chloride of 106, BUN of 22, creatinine 1.6, glucose of 81  CT head demonstrates no acute intracranial findings.  CT C-spine negative for acute fractures.  Prior fusion of C4 and C5.  Right elbow x-ray negative for fracture.  EKG on my interpretation showed sinus bradycardia with a heart rate of 54.  Due to the patient's unexplained syncopal episode, [facility] hospitalist contacted for admission.

## 2022-07-21 NOTE — Consult Note (Signed)
CARDIOLOGY CONSULT NOTE       Patient ID: [name] MRN: [id number] DOB/AGE: 10-05-1950 71 y.o.  Admit date: 07/20/2022 Referring Physician: Imogene Burn Primary Physician: Sheliah Hatch, MD Primary Cardiologist: Delta/Camnitz Reason for Consultation: Syncope  Principal Problem:   Syncope Active Problems:   Chronic diastolic heart failure   Type 2 diabetes mellitus with diabetic chronic kidney disease   Essential hypertension   Postsurgical hypothyroidism   Chronic renal impairment, stage 4 (severe)   Hyperlipidemia associated with type 2 diabetes mellitus   Sinus bradycardia   HPI:  72 y.o. history of diastolic CHF , obesity HTN DM-2, hypothyroid, CRF and bradycardia. Admitted with syncope while checking out at [facility] May have been hypoglycemic after am insulin Given glucose tabs by EMS No chest pain, dyspnea or palpitations. Standing in check out line and felt lightheaded with tunnel vision. Fell and hit head. CT head, c spine and right elbow negative Telemetry with SB high 40's low 50's no heart block BP ok Not on beta blocker. Troponin negative Hct 36.7 Cr 1.63 improved. TSH normal 06/14/22 Currently feels well with HR 54 bpm in ER  TTE is pending She had TTE 04/17/19 with severe LVH septal thickness 16 mm EF 60-65% mild MR trivial AR and no effusion Aorta 4.1 cm on CT 06/23/21 No CXR done in ER  ROS All other systems reviewed and negative except as noted above  Past Medical History:  Diagnosis Date   Anginal pain 03/19/2012   saw Dr. Melburn Popper .. she thinks its more related to stomach issues   Arthritis    "all over; mostly in my back" (03/20/2012)   Breast cancer    "right" (03/20/2012)   Cardiomyopathy    Chest pain 06/2005   Hospitalized, dystolic dysfunction   Chronic lower back pain    "have 2 herniated discs; going to have to have a fusion" (03/20/2012)   CKD (chronic kidney disease), stage III    Family history of anesthesia complication    "father has  nausea and vomiting"   Gallstones    GERD (gastroesophageal reflux disease)    H/O hiatal hernia    History of kidney stones    History of right mastectomy    Hypertension    "patient states never had HTN, takes Hyzaar for heart   Hypothyroidism    Irritable bowel syndrome    Migraines    "it's been a long time since I've had one" (03/20/2012)   Osteoarthritis    Peripheral neuropathy    PONV (postoperative nausea and vomiting)    Sciatica    Type II diabetes mellitus     Family History  Problem Relation Age of Onset   Hypertension Mother    Breast cancer Mother 64   Diabetes Father    Hypertension Father    Hypertension Sister    Hypertension Brother    Hypertension Brother    Diabetes Brother    Diabetes Brother    Diabetes Other        Family Hx. of Diabetes   Hypertension Other        Family History of HTN   Deep vein thrombosis Daughter    Breast cancer Maternal Aunt    Colon cancer Neg Hx    Esophageal cancer Neg Hx    Liver cancer Neg Hx    Rectal cancer Neg Hx    Stomach cancer Neg Hx     Social History   Socioeconomic History   Marital status:  Married    Spouse name: Not on file   Number of children: 1   Years of education: Not on file   Highest education level: Not on file  Occupational History   Occupation: Retired since 2003  Tobacco Use   Smoking status: Never   Smokeless tobacco: Never  Vaping Use   Vaping Use: Never used  Substance and Sexual Activity   Alcohol use: No   Drug use: No   Sexual activity: Not on file  Other Topics Concern   Not on file  Social History Narrative   Not on file   Social Determinants of Health   Financial Resource Strain: Medium Risk (12/23/2021)   Overall Financial Resource Strain (CARDIA)    Difficulty of Paying Living Expenses: Somewhat hard  Food Insecurity: No Food Insecurity (12/23/2021)   Hunger Vital Sign    Worried About Running Out of Food in the Last Year: Never true    Ran Out of Food in the  Last Year: Never true  Transportation Needs: No Transportation Needs (12/23/2021)   PRAPARE - Administrator, Civil Service (Medical): No    Lack of Transportation (Non-Medical): No  Physical Activity: Insufficiently Active (12/23/2021)   Exercise Vital Sign    Days of Exercise per Week: 3 days    Minutes of Exercise per Session: 30 min  Stress: No Stress Concern Present (12/23/2021)   [employer] of Occupational Health - Occupational Stress Questionnaire    Feeling of Stress : Not at all  Social Connections: Moderately Integrated (12/23/2021)   Social Connection and Isolation Panel [NHANES]    Frequency of Communication with Friends and Family: More than three times a week    Frequency of Social Gatherings with Friends and Family: More than three times a week    Attends Religious Services: More than 4 times per year    Active Member of [employer] or Organizations: No    Attends Banker Meetings: Never    Marital Status: Married  Catering manager Violence: Not At Risk (12/23/2021)   Humiliation, Afraid, Rape, and Kick questionnaire    Fear of Current or Ex-Partner: No    Emotionally Abused: No    Physically Abused: No    Sexually Abused: No    Past Surgical History:  Procedure Laterality Date   ABDOMINAL HYSTERECTOMY  1993   adenosin cardiolite  07/2005   (+) wall motion abnormality   AXILLARY LYMPH NODE DISSECTION  2003   squamous cell cancer   BACK SURGERY     CARDIAC CATHETERIZATION  ? 2005 / 2007   CHOLECYSTECTOMY N/A 01/24/2018   Procedure: LAPAROSCOPIC CHOLECYSTECTOMY;  Surgeon: Harriette Bouillon, MD;  Location: [facility] OR;  Service: General;  Laterality: N/A;   CT abdomen and pelvis  02/2010   Same   ESOPHAGOGASTRODUODENOSCOPY  2005   GERD   KNEE ARTHROSCOPY  1990's   "right" (03/20/2012)   LUMBAR FUSION N/A 08/22/2014   Procedure: Right L2-3 and right L1-2 transforaminal lumbar interbody fusion with pedicle screws, rods, sleeves, cages, local bone  graft, Vivigen, cancellous chips;  Surgeon: Kerrin Champagne, MD;  Location: [facility] OR;  Service: Orthopedics;  Laterality: N/A;   LUMBAR LAMINECTOMY/DECOMPRESSION MICRODISCECTOMY N/A 02/04/2014   Procedure: RIGHT L2-3 MICRODISCECTOMY ;  Surgeon: Kerrin Champagne, MD;  Location: [facility] OR;  Service: Orthopedics;  Laterality: N/A;   MASTECTOMY Right    MASTECTOMY WITH AXILLARY LYMPH NODE DISSECTION  12/2003   "right" (03/20/2012)   POSTERIOR CERVICAL FUSION/FORAMINOTOMY  2001   SHOULDER ARTHROSCOPY W/ ROTATOR CUFF REPAIR  2003   Left   THYROIDECTOMY  1977   Right   TOTAL KNEE ARTHROPLASTY  2000   Right   [place] of abdomen  07/2005   Fatty liver / kidney stones      Current Facility-Administered Medications:    acetaminophen (TYLENOL) tablet 650 mg, 650 mg, Oral, Q6H PRN **OR** acetaminophen (TYLENOL) suppository 650 mg, 650 mg, Rectal, Q6H PRN, Carollee Herter, DO   amLODipine (NORVASC) tablet 10 mg, 10 mg, Oral, Daily, Carollee Herter, DO   heparin injection 5,000 Units, 5,000 Units, Subcutaneous, Q8H, Carollee Herter, DO, 5,000 Units at 07/21/22 0836   hydrALAZINE (APRESOLINE) injection 10 mg, 10 mg, Intravenous, Q6H PRN, Carollee Herter, DO   hydrALAZINE (APRESOLINE) tablet 50 mg, 50 mg, Oral, BID, Carollee Herter, DO, 50 mg at 07/21/22 1610   insulin aspart (novoLOG) injection 0-5 Units, 0-5 Units, Subcutaneous, QHS, Imogene Burn, Eric, DO   insulin aspart (novoLOG) injection 0-9 Units, 0-9 Units, Subcutaneous, TID WC, Carollee Herter, DO   levothyroxine (SYNTHROID) tablet 100 mcg, 100 mcg, Oral, Daily, Carollee Herter, DO   losartan (COZAAR) tablet 25 mg, 25 mg, Oral, Daily, Imogene Burn, Eric, DO   ondansetron [facility]) tablet 4 mg, 4 mg, Oral, Q6H PRN **OR** ondansetron (ZOFRAN) injection 4 mg, 4 mg, Intravenous, Q6H PRN, Carollee Herter, DO   pantoprazole (PROTONIX) EC tablet 40 mg, 40 mg, Oral, BID, Carollee Herter, DO   rosuvastatin (CRESTOR) tablet 20 mg, 20 mg, Oral, QHS, Chen, Eric, DO   sodium bicarbonate tablet 650 mg, 650 mg, Oral, BID, Carollee Herter,  DO   torsemide [facility]) tablet 20 mg, 20 mg, Oral, BID, Carollee Herter, DO  Current Outpatient Medications:    acetaminophen (TYLENOL) 500 MG tablet, Take 500-1,000 mg by mouth every 6 (six) hours as needed for moderate pain, headache or mild pain., Disp: , Rfl:    amLODipine (NORVASC) 10 MG tablet, Take 1 tablet (10 mg total) by mouth daily., Disp: 90 tablet, Rfl: 3   aspirin 81 MG tablet, Take 81 mg by mouth daily., Disp: , Rfl:    carvedilol (COREG) 6.25 MG tablet, Take 0.5 tablets (3.125 mg total) by mouth 2 (two) times daily with a meal., Disp: 90 tablet, Rfl: 1   Cholecalciferol (VITAMIN D) 50 MCG (2000 UT) tablet, Take 2,000 Units by mouth daily., Disp: , Rfl:    dapagliflozin propanediol (FARXIGA) 10 MG TABS tablet, Take by mouth daily., Disp: , Rfl:    DULoxetine (CYMBALTA) 20 MG capsule, Take 20 mg by mouth daily., Disp: , Rfl:    famotidine (PEPCID) 40 MG tablet, TAKE 1 TABLET BY MOUTH EVERY DAY (Patient taking differently: Take 40 mg by mouth daily.), Disp: 90 tablet, Rfl: 1   glipiZIDE (GLUCOTROL XL) 5 MG 24 hr tablet, Take 1 tablet by mouth daily., Disp: , Rfl:    hydrALAZINE (APRESOLINE) 50 MG tablet, TAKE 1 TABLET BY MOUTH TWICE A DAY, Disp: 180 tablet, Rfl: 1   insulin NPH-regular Human (NOVOLIN 70/30) (70-30) 100 UNIT/ML injection, Inject 30 Units into the skin 2 (two) times daily with a meal., Disp: 20 mL, Rfl: 1   levothyroxine (SYNTHROID) 100 MCG tablet, Take 1 tablet (100 mcg total) by mouth daily., Disp: 90 tablet, Rfl: 3   losartan (COZAAR) 25 MG tablet, Take 1 tablet (25 mg total) by mouth daily., Disp: 90 tablet, Rfl: 1   metoCLOPramide (REGLAN) 10 MG tablet, TAKE 1 TABLET FOUR TIMES DAILY (Patient taking differently: Take 10 mg  by mouth in the morning, at noon, in the evening, and at bedtime.), Disp: 360 tablet, Rfl: 1   pantoprazole (PROTONIX) 40 MG tablet, Take 1 tablet (40 mg total) by mouth 2 (two) times daily., Disp: 180 tablet, Rfl: 1   Polyethyl Glycol-Propyl Glycol  (SYSTANE ULTRA) 0.4-0.3 % SOLN, Apply 1-2 drops to eye daily as needed (dry eyes)., Disp: , Rfl:    polyethylene glycol (MIRALAX / GLYCOLAX) packet, Take 17 g by mouth daily as needed for moderate constipation (MIX AND DRINK)., Disp: , Rfl:    rosuvastatin (CRESTOR) 20 MG tablet, TAKE 1 TABLET AT BEDTIME (Patient taking differently: Take 20 mg by mouth daily.), Disp: 90 tablet, Rfl: 1   sodium bicarbonate 650 MG tablet, Take 650 mg by mouth 2 (two) times daily., Disp: , Rfl:    SUMAtriptan (IMITREX) 50 MG tablet, TAKE 1 TAB EVERY 2 HRS AS NEEDED FOR MIGRAINE. MAY REPEAT IN 2 HOURS IF HEADACHE PERSISTS OR RECURS. (Patient taking differently: Take 50 mg by mouth every 2 (two) hours as needed for migraine (May repeat in 2 hours if headache persists or recurs.).), Disp: 9 tablet, Rfl: 1   tizanidine (ZANAFLEX) 2 MG capsule, Take 1 capsule (2 mg total) by mouth 3 (three) times daily as needed for muscle spasms., Disp: 60 capsule, Rfl: 1   torsemide (DEMADEX) 20 MG tablet, Take 1 tablet (20 mg total) by mouth 2 (two) times daily., Disp: 180 tablet, Rfl: 1   Alcohol Swabs (B-D SINGLE USE SWABS REGULAR) PADS, Use as directed., Disp: 100 each, Rfl: 1   Blood Glucose Monitoring Suppl (TRUE METRIX AIR GLUCOSE METER) w/Device KIT, 1 kit by Does not apply route 3 (three) times daily., Disp: 1 kit, Rfl: 0   glucose blood (TRUE METRIX BLOOD GLUCOSE TEST) test strip, Use as instructed, Disp: 100 each, Rfl: 12   TRUEplus Lancets 33G MISC, Use as directed, Disp: 300 each, Rfl: 3   Vitamin D, Ergocalciferol, (DRISDOL) 1.25 MG (50000 UNIT) CAPS capsule, Take 1 capsule (50,000 Units total) by mouth every 7 (seven) days. (Patient not taking: Reported on 07/21/2022), Disp: 7 capsule, Rfl: 12  amLODipine  10 mg Oral Daily   heparin  5,000 Units Subcutaneous Q8H   hydrALAZINE  50 mg Oral BID   insulin aspart  0-5 Units Subcutaneous QHS   insulin aspart  0-9 Units Subcutaneous TID WC   levothyroxine  100 mcg Oral Daily    losartan  25 mg Oral Daily   pantoprazole  40 mg Oral BID   rosuvastatin  20 mg Oral QHS   sodium bicarbonate  650 mg Oral BID   torsemide  20 mg Oral BID     Physical Exam: Blood pressure (!) 146/44, pulse (!) 49, temperature 98.3 F (36.8 C), temperature source Oral, resp. rate 16, height 5\' 4"  (1.626 m), weight 79.8 kg, SpO2 96 %.   Affect appropriate Healthy:  appears stated age HEENT: normal Neck supple with no adenopathy JVP normal no bruits no thyromegaly Lungs clear with no wheezing and good diaphragmatic motion Heart:  S1/S2 no murmur, no rub, gallop or click PMI normal Abdomen: benighn, BS positve, no tenderness, no AAA no bruit.  No HSM or HJR Distal pulses intact with no bruits No edema Neuro non-focal Skin warm and dry No muscular weakness   Labs:   Lab Results  Component Value Date   WBC 10.5 07/20/2022   HGB 11.6 (L) 07/20/2022   HCT 36.7 07/20/2022   MCV 91.8 07/20/2022  PLT 317 07/20/2022    Recent Labs  Lab 07/20/22 2034  NA 140  K 4.4  CL 106  CO2 24  BUN 22  CREATININE 1.63*  CALCIUM 10.4*  GLUCOSE 81   Lab Results  Component Value Date   CKTOTAL 60 09/14/2015   TROPONINI 0.03 (HH) 12/26/2017    Lab Results  Component Value Date   CHOL 133 06/14/2022   CHOL 162 02/05/2021   CHOL 133 06/02/2020   Lab Results  Component Value Date   HDL 41.50 06/14/2022   HDL 51 02/05/2021   HDL 41 (L) 06/02/2020   Lab Results  Component Value Date   LDLCALC 76 02/05/2021   LDLCALC 60 06/02/2020   LDLCALC 79 08/05/2019   Lab Results  Component Value Date   TRIG 225.0 (H) 06/14/2022   TRIG 210 (H) 02/05/2021   TRIG 275 (H) 06/02/2020   Lab Results  Component Value Date   CHOLHDL 3 06/14/2022   CHOLHDL 3.2 06/02/2020   CHOLHDL 3.8 08/05/2019   Lab Results  Component Value Date   LDLDIRECT 58.0 06/14/2022      Radiology: CT Cervical Spine Wo Contrast  Result Date: 07/20/2022 CLINICAL DATA:  Status post trauma. EXAM: CT  CERVICAL SPINE WITHOUT CONTRAST TECHNIQUE: Multidetector CT imaging of the cervical spine was performed without intravenous contrast. Multiplanar CT image reconstructions were also generated. RADIATION DOSE REDUCTION: This exam was performed according to the departmental dose-optimization program which includes automated exposure control, adjustment of the mA and/or kV according to patient size and/or use of iterative reconstruction technique. COMPARISON:  None Available. FINDINGS: Alignment: Normal. Skull base and vertebrae: No acute fracture. A metallic density fusion plate and screws are seen along the anterior aspects of the C4 and C5 vertebral bodies. Soft tissues and spinal canal: No prevertebral fluid or swelling. No visible canal hematoma. Disc levels: Moderate to marked severity endplate sclerosis, marked severity anterior osteophyte formation and posterior bony spurring are seen at the levels of C2-C3, C3-C4, C4-C5, C5-C6 and C6-C7. Extensive calcification of the anterior longitudinal ligament is also present. There is marked severity narrowing of the anterior atlantoaxial articulation. There is surgical fusion of the C4-C5 level with moderate to marked severity intervertebral disc space narrowing seen at the levels of C5-C6 C6-C7 and C7-T1. Moderate severity, predominately posterior intervertebral disc space narrowing is seen at C2-C3 and C3-C4. Bilateral marked severity multilevel facet joint hypertrophy is noted. Upper chest: Negative. Other: None. IMPRESSION: 1. No acute fracture or subluxation in the cervical spine. 2. Marked severity multilevel degenerative changes, as described above. 3. Prior surgical fusion of the C4-C5 level. Electronically Signed   By: Aram Candela M.D.   On: 07/20/2022 18:55   CT HEAD WO CONTRAST  Result Date: 07/20/2022 CLINICAL DATA:  Status post trauma. EXAM: CT HEAD WITHOUT CONTRAST TECHNIQUE: Contiguous axial images were obtained from the base of the skull through  the vertex without intravenous contrast. RADIATION DOSE REDUCTION: This exam was performed according to the departmental dose-optimization program which includes automated exposure control, adjustment of the mA and/or kV according to patient size and/or use of iterative reconstruction technique. COMPARISON:  October 19, 2019 FINDINGS: Brain: There is mild cerebral atrophy with widening of the extra-axial spaces and ventricular dilatation. There are areas of decreased attenuation within the white matter tracts of the supratentorial brain, consistent with microvascular disease changes. Vascular: No hyperdense vessel or unexpected calcification. Skull: Normal. Negative for fracture or focal lesion. Sinuses/Orbits: No acute finding. Other: Mild scalp  soft tissue swelling is seen along the posterior aspect of the vertex on the left. IMPRESSION: 1. No acute intracranial abnormality. 2. Generalized cerebral atrophy and microvascular disease changes of the supratentorial brain. Electronically Signed   By: Aram Candela M.D.   On: 07/20/2022 18:52   DG Elbow 2 Views Right  Result Date: 07/20/2022 CLINICAL DATA:  Syncope, fall EXAM: RIGHT ELBOW - 2 VIEW COMPARISON:  None Available. FINDINGS: No displaced fracture or dislocation is seen. There are small linear calcific densities at the attachment of triceps to the olecranon process and adjacent to the medial epicondyle of distal humerus. This finding may suggest calcific bursitis or tendinosis. There is no displacement of posterior fat pad. There is soft tissue swelling over the olecranon process. IMPRESSION: No recent fracture or dislocation is seen in right elbow. Small linear calcifications are noted in soft tissues adjacent to the olecranon process and medial epicondyle of distal humerus suggesting calcific tendinosis. Electronically Signed   By: Ernie Avena M.D.   On: 07/20/2022 18:23   NM Parathyroid W/Spect  Result Date: 06/29/2022 CLINICAL DATA:   Hypercalcemia, prior partial thyroidectomy EXAM: NM PARATHYROID SCINTIGRAPHY AND SPECT IMAGING TECHNIQUE: Following intravenous administration of radiopharmaceutical, early and 2-hour delayed planar images were obtained in the anterior projection. Delayed triplanar SPECT images were also obtained at 2 hours. RADIOPHARMACEUTICALS:  24 mCi Tc-47m Sestamibi IV COMPARISON:  Thyroid ultrasound 06/26/2020 FINDINGS: Planar imaging: Prior RIGHT thyroid lobectomy. Initial image demonstrates normal distribution of sestamibi in the LEFT lobe. Delayed image demonstrates washout of tracer from LEFT lobe with abnormal sestamibi retention at the inferior pole of the LEFT thyroid lobe. SPECT imaging: Focal abnormal sestamibi retention inferiorly posteriorly at the inferior pole the LEFT thyroid lobe highly suspicious for a LEFT inferior parathyroid adenoma. No abnormal tracer retention at the expected position of the LEFT superior parathyroid gland. No uptake of tracer at the RIGHT thyroid bed nor ectopically in the mediastinum. IMPRESSION: Focal abnormal sestamibi retention suspicious for a LEFT inferior parathyroid adenoma. Prior RIGHT thyroid lobectomy. Electronically Signed   By: Ulyses Southward M.D.   On: 06/29/2022 14:24    EKG: SB rate 54 poor R wave progression    ASSESSMENT AND PLAN:   Syncope: sounds vasovagal ? Related to low BS. ECG with chronic bradycardia no high grade AV block or arrhythmia. TTE is pending no history of CAD Myovue 04/17/19 with breast attenuation no ischemia EF 57% If TTE shows normal EF can d/c home will arrange outpatient 30 day monitor and f/u with Dr Duke Salvia  DM:  continue home meds BS 82 this am CRF: Cr 1.6 stable  HTN:  continue norvasc , losartan and hydralazine Thyroid:  TSH normal March continue synthroid replacement   Signed: Charlton Haws 07/21/2022, 9:46 AM

## 2022-07-21 NOTE — ED Notes (Signed)
RN ambulated with pt to restroom. Pt states she would like to use a walker when moving around. RN informed pt she will locate one. RN educated pt to hit callbell for assistance to and from restroom. RN provided slip resistant socks and fall risk band.

## 2022-07-21 NOTE — ED Notes (Signed)
ED TO INPATIENT HANDOFF REPORT  ED Nurse Name and Phone #: [phone]  S Name/Age/Gender [name] 72 y.o. female Room/Bed: 044C/044C  Code Status   Code Status: Full Code  Home/SNF/Other Home Patient oriented to: self, place, time, and situation Is this baseline? Yes   Triage Complete: Triage complete  Chief Complaint Syncope [R55]  Triage Note Pt bib ems from [employer]; syncopal episode, hit head on linoleum along with r elbow; skin tear r elbow; several head pain radiating to back of neck; c collar in place; cbg 70, pt has not eaten, took 34 units of novolog today; given instant glucose pta, last cbg 122; tremors at baseline, husband states worse than normal; a and o x 4 at present; not on thinners; HR 50s, bp 154/80, 99% RA, cbg 122; pt refused IV with ems   Allergies Allergies  Allergen Reactions   Dilaudid [Hydromorphone Hcl] Nausea And Vomiting   Hydromorphone Nausea And Vomiting   Lipitor [Atorvastatin] Other (See Comments)    Body & Muscle Aches   Adhesive [Tape] Rash   Ampicillin Rash and Other (See Comments)    Also developed welts on her hands when she took it in the hospital   Latex Itching, Dermatitis, Rash and Other (See Comments)    Patient had a reaction to tape after surgery and they told her she was allergic to Latex    Penicillins Rash and Other (See Comments)    Has patient had a PCN reaction causing immediate rash, facial/tongue/throat swelling, SOB or lightheadedness with hypotension: No Has patient had a PCN reaction causing severe rash involving mucus membranes or skin necrosis: No Has patient had a PCN reaction that required hospitalization: Yes - in hospital receiving treatment Has patient had a PCN reaction occurring within the last 10 years: No If all of the above answers are "NO", then may proceed with Cephalosporin use.     Level of Care/Admitting Diagnosis ED Disposition     ED Disposition  Admit   Condition  --   Comment   Hospital Area: [facility] [[id number]]  Level of Care: Telemetry Cardiac [[id number]]  May place patient in observation at [facility] or [facility] if equivalent level of care is available:: No  Covid Evaluation: Asymptomatic - no recent exposure (last 10 days) testing not required  Diagnosis: Syncope [206001]  Admitting Physician: Imogene Burn, ERIC [[id number]]  Attending Physician: Imogene Burn, ERIC [[id number]]          B Medical/Surgery History Past Medical History:  Diagnosis Date   Anginal pain 03/19/2012   saw Dr. Melburn Popper .. she thinks its more related to stomach issues   Arthritis    "all over; mostly in my back" (03/20/2012)   Breast cancer    "right" (03/20/2012)   Cardiomyopathy    Chest pain 06/2005   Hospitalized, dystolic dysfunction   Chronic lower back pain    "have 2 herniated discs; going to have to have a fusion" (03/20/2012)   CKD (chronic kidney disease), stage III    Family history of anesthesia complication    "father has nausea and vomiting"   Gallstones    GERD (gastroesophageal reflux disease)    H/O hiatal hernia    History of kidney stones    History of right mastectomy    Hypertension    "patient states never had HTN, takes Hyzaar for heart   Hypothyroidism    Irritable bowel syndrome    Migraines    "it's been a long  time since I've had one" (03/20/2012)   Osteoarthritis    Peripheral neuropathy    PONV (postoperative nausea and vomiting)    Sciatica    Type II diabetes mellitus    Past Surgical History:  Procedure Laterality Date   ABDOMINAL HYSTERECTOMY  1993   adenosin cardiolite  07/2005   (+) wall motion abnormality   AXILLARY LYMPH NODE DISSECTION  2003   squamous cell cancer   BACK SURGERY     CARDIAC CATHETERIZATION  ? 2005 / 2007   CHOLECYSTECTOMY N/A 01/24/2018   Procedure: LAPAROSCOPIC CHOLECYSTECTOMY;  Surgeon: Harriette Bouillon, MD;  Location: [facility] OR;  Service: General;  Laterality: N/A;   CT abdomen and pelvis  02/2010   Same    ESOPHAGOGASTRODUODENOSCOPY  2005   GERD   KNEE ARTHROSCOPY  1990's   "right" (03/20/2012)   LUMBAR FUSION N/A 08/22/2014   Procedure: Right L2-3 and right L1-2 transforaminal lumbar interbody fusion with pedicle screws, rods, sleeves, cages, local bone graft, Vivigen, cancellous chips;  Surgeon: Kerrin Champagne, MD;  Location: [facility] OR;  Service: Orthopedics;  Laterality: N/A;   LUMBAR LAMINECTOMY/DECOMPRESSION MICRODISCECTOMY N/A 02/04/2014   Procedure: RIGHT L2-3 MICRODISCECTOMY ;  Surgeon: Kerrin Champagne, MD;  Location: [facility] OR;  Service: Orthopedics;  Laterality: N/A;   MASTECTOMY Right    MASTECTOMY WITH AXILLARY LYMPH NODE DISSECTION  12/2003   "right" (03/20/2012)   POSTERIOR CERVICAL FUSION/FORAMINOTOMY  2001   SHOULDER ARTHROSCOPY W/ ROTATOR CUFF REPAIR  2003   Left   THYROIDECTOMY  1977   Right   TOTAL KNEE ARTHROPLASTY  2000   Right   [place] of abdomen  07/2005   Fatty liver / kidney stones     A IV Location/Drains/Wounds Patient Lines/Drains/Airways Status     Active Line/Drains/Airways     Name Placement date Placement time Site Days   Peripheral IV 07/20/22 20 G 1.88" Left Forearm 07/20/22  2042  Forearm  1   Incision - 4 Ports Abdomen 1: Umbilicus 2: Mid;Upper 3: Upper;Lateral;Right 4: Right;Lower;Lateral 01/24/18  1121  -- 1639            Intake/Output Last 24 hours  Intake/Output Summary (Last 24 hours) at 07/21/2022 1345 Last data filed at 07/20/2022 2205 Gross per 24 hour  Intake 1000 ml  Output --  Net 1000 ml    Labs/Imaging Results for orders placed or performed during the hospital encounter of 07/20/22 (from the past 48 hour(s))  CBG monitoring, ED     Status: Abnormal   Collection Time: 07/20/22  5:41 PM  Result Value Ref Range   Glucose-Capillary 146 (H) 70 - 99 mg/dL    Comment: Glucose reference range applies only to samples taken after fasting for at least 8 hours.   Comment 1 Notify RN    Comment 2 Document in Chart   CBG monitoring, ED      Status: None   Collection Time: 07/20/22  7:43 PM  Result Value Ref Range   Glucose-Capillary 83 70 - 99 mg/dL    Comment: Glucose reference range applies only to samples taken after fasting for at least 8 hours.  Basic metabolic panel     Status: Abnormal   Collection Time: 07/20/22  8:34 PM  Result Value Ref Range   Sodium 140 135 - 145 mmol/L   Potassium 4.4 3.5 - 5.1 mmol/L    Comment: HEMOLYSIS AT THIS LEVEL MAY AFFECT RESULT   Chloride 106 98 - 111 mmol/L   CO2  24 22 - 32 mmol/L   Glucose, Bld 81 70 - 99 mg/dL    Comment: Glucose reference range applies only to samples taken after fasting for at least 8 hours.   BUN 22 8 - 23 mg/dL   Creatinine, Ser 1.61 (H) 0.44 - 1.00 mg/dL   Calcium 09.6 (H) 8.9 - 10.3 mg/dL   GFR, Estimated 33 (L) >60 mL/min    Comment: (NOTE) Calculated using the CKD-EPI Creatinine Equation (2021)    Anion gap 10 5 - 15    Comment: Performed at [facility], [address], [place], [place] [address]  CBC     Status: Abnormal   Collection Time: 07/20/22  8:34 PM  Result Value Ref Range   WBC 10.5 4.0 - 10.5 K/uL   RBC 4.00 3.87 - 5.11 MIL/uL   Hemoglobin 11.6 (L) 12.0 - 15.0 g/dL   HCT 98.1 19.1 - 47.8 %   MCV 91.8 80.0 - 100.0 fL   MCH 29.0 26.0 - 34.0 pg   MCHC 31.6 30.0 - 36.0 g/dL   RDW 29.5 62.1 - 30.8 %   Platelets 317 150 - 400 K/uL   nRBC 0.0 0.0 - 0.2 %    Comment: Performed at [facility], [address], [place], [place] [address]  Troponin I (High Sensitivity)     Status: Abnormal   Collection Time: 07/20/22  8:34 PM  Result Value Ref Range   Troponin I (High Sensitivity) 49 (H) <18 ng/L    Comment: (NOTE) Elevated high sensitivity troponin I (hsTnI) values and significant  changes across serial measurements may suggest ACS but many other  chronic and acute conditions are known to elevate hsTnI results.  Refer to the "Links" section for chest pain algorithms and additional  guidance. Performed at [facility]  [facility], [address], [place], [place] [address]   Troponin I (High Sensitivity)     Status: Abnormal   Collection Time: 07/20/22  9:41 PM  Result Value Ref Range   Troponin I (High Sensitivity) 56 (H) <18 ng/L    Comment: (NOTE) Elevated high sensitivity troponin I (hsTnI) values and significant  changes across serial measurements may suggest ACS but many other  chronic and acute conditions are known to elevate hsTnI results.  Refer to the "Links" section for chest pain algorithms and additional  guidance. Performed at [facility], [address] [place], [place] [address]   Urinalysis, Routine w reflex microscopic -Urine, Clean Catch     Status: Abnormal   Collection Time: 07/20/22 10:16 PM  Result Value Ref Range   Color, Urine STRAW (A) YELLOW   APPearance CLEAR CLEAR   Specific Gravity, Urine 1.006 1.005 - 1.030   pH 7.0 5.0 - 8.0   Glucose, UA >=500 (A) NEGATIVE mg/dL   Hgb urine dipstick SMALL (A) NEGATIVE   Bilirubin Urine NEGATIVE NEGATIVE   Ketones, ur NEGATIVE NEGATIVE mg/dL   Protein, ur 324 (A) NEGATIVE mg/dL   Nitrite NEGATIVE NEGATIVE   Leukocytes,Ua SMALL (A) NEGATIVE   RBC / HPF 0-5 0 - 5 RBC/hpf   WBC, UA 11-20 0 - 5 WBC/hpf   Bacteria, UA FEW (A) NONE SEEN   Squamous Epithelial / HPF 0-5 0 - 5 /HPF    Comment: Performed at [facility], [address], [place], [place] [address]  CBG monitoring, ED     Status: None   Collection Time: 07/21/22  7:59 AM  Result  Value Ref Range   Glucose-Capillary 82 70 - 99 mg/dL    Comment: Glucose reference range applies only to samples taken after fasting for at least 8 hours.  CBG monitoring, ED     Status: Abnormal   Collection Time: 07/21/22 11:33 AM  Result Value Ref Range   Glucose-Capillary 102 (H) 70 - 99 mg/dL    Comment: Glucose reference range applies only to samples taken after fasting for at least 8 hours.   *Note: Due to a large number of results and/or encounters for the requested time period,  some results have not been displayed. A complete set of results can be found in Results Review.   ECHOCARDIOGRAM COMPLETE  Result Date: 07/21/2022    ECHOCARDIOGRAM REPORT   Patient Name:   AIMAR SIEMINSKI Date of Exam: 07/21/2022 Medical Rec #:  [id number]         Height:       64.0 in Accession #:    [id number]        Weight:       176.0 lb Date of Birth:  1950/08/15         BSA:          1.853 m Patient Age:    71 years          BP:           134/50 mmHg Patient Gender: F                 HR:           50 bpm. Exam Location:  Inpatient Procedure: 2D Echo, Cardiac Doppler and Color Doppler Indications:    Syncope R55  History:        Patient has prior history of Echocardiogram examinations, most                 recent 04/17/2019. CHF; Risk Factors:Non-Smoker, Diabetes,                 Hypertension and Dyslipidemia.  Sonographer:    Dondra Prader RVT RCS Referring Phys: [id number] ERIC CHEN IMPRESSIONS  1. Left ventricular ejection fraction, by estimation, is 60 to 65%. The left ventricle has normal function. The left ventricle has no regional wall motion abnormalities. There is mild concentric left ventricular hypertrophy. Left ventricular diastolic parameters are consistent with Grade I diastolic dysfunction (impaired relaxation).  2. Right ventricular systolic function is normal. The right ventricular size is mildly enlarged.  3. The mitral valve is degenerative. Trivial mitral valve regurgitation. No evidence of mitral stenosis.  4. The aortic valve is normal in structure. Aortic valve regurgitation is trivial. No aortic stenosis is present.  5. Aortic dilatation noted. There is mild dilatation of the ascending aorta, measuring 38 mm.  6. The inferior vena cava is normal in size with greater than 50% respiratory variability, suggesting right atrial pressure of 3 mmHg. FINDINGS  Left Ventricle: Left ventricular ejection fraction, by estimation, is 60 to 65%. The left ventricle has normal function. The left ventricle  has no regional wall motion abnormalities. The left ventricular internal cavity size was normal in size. There is  mild concentric left ventricular hypertrophy. Left ventricular diastolic parameters are consistent with Grade I diastolic dysfunction (impaired relaxation). Normal left ventricular filling pressure. Right Ventricle: The right ventricular size is mildly enlarged. No increase in right ventricular wall thickness. Right ventricular systolic function is normal. Left Atrium: Left atrial size was normal in size. Right Atrium: Right atrial size  was normal in size. Pericardium: There is no evidence of pericardial effusion. Mitral Valve: The mitral valve is degenerative in appearance. There is mild calcification of the mitral valve leaflet(s). Mild mitral annular calcification. Trivial mitral valve regurgitation. No evidence of mitral valve stenosis. Tricuspid Valve: The tricuspid valve is normal in structure. Tricuspid valve regurgitation is trivial. No evidence of tricuspid stenosis. Aortic Valve: The aortic valve is normal in structure. Aortic valve regurgitation is trivial. No aortic stenosis is present. Aortic valve mean gradient measures 6.0 mmHg. Aortic valve peak gradient measures 10.5 mmHg. Aortic valve area, by VTI measures 2.27 cm. Pulmonic Valve: The pulmonic valve was normal in structure. Pulmonic valve regurgitation is mild. No evidence of pulmonic stenosis. Aorta: Aortic dilatation noted. There is mild dilatation of the ascending aorta, measuring 38 mm. Venous: The inferior vena cava is normal in size with greater than 50% respiratory variability, suggesting right atrial pressure of 3 mmHg. IAS/Shunts: No atrial level shunt detected by color flow Doppler.  LEFT VENTRICLE PLAX 2D LVIDd:         4.50 cm   Diastology LVIDs:         2.70 cm   LV e' medial:    4.67 cm/s LV PW:         1.20 cm   LV E/e' medial:  10.9 LV IVS:        1.70 cm   LV e' lateral:   9.98 cm/s LVOT diam:     2.10 cm   LV E/e'  lateral: 5.1 LV SV:         86 LV SV Index:   46 LVOT Area:     3.46 cm  RIGHT VENTRICLE             IVC RV Basal diam:  4.20 cm     IVC diam: 1.30 cm RV S prime:     14.40 cm/s TAPSE (M-mode): 2.4 cm LEFT ATRIUM             Index        RIGHT ATRIUM           Index LA diam:        4.30 cm 2.32 cm/m   RA Area:     11.70 cm LA Vol (A2C):   64.9 ml 35.03 ml/m  RA Volume:   27.00 ml  14.57 ml/m LA Vol (A4C):   50.0 ml 26.98 ml/m LA Biplane Vol: 58.6 ml 31.63 ml/m  AORTIC VALVE                     PULMONIC VALVE AV Area (Vmax):    2.14 cm      PV Vmax:          0.92 m/s AV Area (Vmean):   1.91 cm      PV Peak grad:     3.3 mmHg AV Area (VTI):     2.27 cm      PR End Diast Vel: 6.05 msec AV Vmax:           162.00 cm/s AV Vmean:          107.000 cm/s AV VTI:            0.379 m AV Peak Grad:      10.5 mmHg AV Mean Grad:      6.0 mmHg LVOT Vmax:         100.00 cm/s LVOT Vmean:  59.100 cm/s LVOT VTI:          0.248 m LVOT/AV VTI ratio: 0.65 AR Vena Contracta: 0.10 cm  AORTA Ao Root diam: 3.40 cm Ao Asc diam:  3.80 cm Ao Arch diam: 3.1 cm MITRAL VALVE                TRICUSPID VALVE MV Area (PHT): 2.77 cm     TR Peak grad:   14.7 mmHg MV Decel Time: 274 msec     TR Vmax:        192.00 cm/s MV E velocity: 50.90 cm/s MV A velocity: 100.00 cm/s  SHUNTS MV E/A ratio:  0.51         Systemic VTI:  0.25 m                             Systemic Diam: 2.10 cm Armanda Magicraci Turner MD Electronically signed by Armanda Magicraci Turner MD Signature Date/Time: 07/21/2022/1:20:59 PM    Final    CT Cervical Spine Wo Contrast  Result Date: 07/20/2022 CLINICAL DATA:  Status post trauma. EXAM: CT CERVICAL SPINE WITHOUT CONTRAST TECHNIQUE: Multidetector CT imaging of the cervical spine was performed without intravenous contrast. Multiplanar CT image reconstructions were also generated. RADIATION DOSE REDUCTION: This exam was performed according to the departmental dose-optimization program which includes automated exposure control, adjustment of  the mA and/or kV according to patient size and/or use of iterative reconstruction technique. COMPARISON:  None Available. FINDINGS: Alignment: Normal. Skull base and vertebrae: No acute fracture. A metallic density fusion plate and screws are seen along the anterior aspects of the C4 and C5 vertebral bodies. Soft tissues and spinal canal: No prevertebral fluid or swelling. No visible canal hematoma. Disc levels: Moderate to marked severity endplate sclerosis, marked severity anterior osteophyte formation and posterior bony spurring are seen at the levels of C2-C3, C3-C4, C4-C5, C5-C6 and C6-C7. Extensive calcification of the anterior longitudinal ligament is also present. There is marked severity narrowing of the anterior atlantoaxial articulation. There is surgical fusion of the C4-C5 level with moderate to marked severity intervertebral disc space narrowing seen at the levels of C5-C6 C6-C7 and C7-T1. Moderate severity, predominately posterior intervertebral disc space narrowing is seen at C2-C3 and C3-C4. Bilateral marked severity multilevel facet joint hypertrophy is noted. Upper chest: Negative. Other: None. IMPRESSION: 1. No acute fracture or subluxation in the cervical spine. 2. Marked severity multilevel degenerative changes, as described above. 3. Prior surgical fusion of the C4-C5 level. Electronically Signed   By: Aram Candelahaddeus  Houston M.D.   On: 07/20/2022 18:55   CT HEAD WO CONTRAST  Result Date: 07/20/2022 CLINICAL DATA:  Status post trauma. EXAM: CT HEAD WITHOUT CONTRAST TECHNIQUE: Contiguous axial images were obtained from the base of the skull through the vertex without intravenous contrast. RADIATION DOSE REDUCTION: This exam was performed according to the departmental dose-optimization program which includes automated exposure control, adjustment of the mA and/or kV according to patient size and/or use of iterative reconstruction technique. COMPARISON:  October 19, 2019 FINDINGS: Brain: There is mild  cerebral atrophy with widening of the extra-axial spaces and ventricular dilatation. There are areas of decreased attenuation within the white matter tracts of the supratentorial brain, consistent with microvascular disease changes. Vascular: No hyperdense vessel or unexpected calcification. Skull: Normal. Negative for fracture or focal lesion. Sinuses/Orbits: No acute finding. Other: Mild scalp soft tissue swelling is seen along the posterior aspect of the vertex on the left.  IMPRESSION: 1. No acute intracranial abnormality. 2. Generalized cerebral atrophy and microvascular disease changes of the supratentorial brain. Electronically Signed   By: Aram Candela M.D.   On: 07/20/2022 18:52   DG Elbow 2 Views Right  Result Date: 07/20/2022 CLINICAL DATA:  Syncope, fall EXAM: RIGHT ELBOW - 2 VIEW COMPARISON:  None Available. FINDINGS: No displaced fracture or dislocation is seen. There are small linear calcific densities at the attachment of triceps to the olecranon process and adjacent to the medial epicondyle of distal humerus. This finding may suggest calcific bursitis or tendinosis. There is no displacement of posterior fat pad. There is soft tissue swelling over the olecranon process. IMPRESSION: No recent fracture or dislocation is seen in right elbow. Small linear calcifications are noted in soft tissues adjacent to the olecranon process and medial epicondyle of distal humerus suggesting calcific tendinosis. Electronically Signed   By: Ernie Avena M.D.   On: 07/20/2022 18:23    Pending Labs Unresulted Labs (From admission, onward)    None       Vitals/Pain Today's Vitals   07/21/22 1100 07/21/22 1130 07/21/22 1134 07/21/22 1200  BP: (!) 140/39 (!) 146/53  (!) 134/50  Pulse: (!) 45 (!) 48  (!) 49  Resp: 14 19  15   Temp:      TempSrc:      SpO2: 96% 98%  96%  Weight:      Height:      PainSc:   0-No pain     Isolation Precautions No active  isolations  Medications Medications  levothyroxine (SYNTHROID) tablet 100 mcg (100 mcg Oral Given 07/21/22 1045)  pantoprazole (PROTONIX) EC tablet 40 mg (40 mg Oral Given 07/21/22 1045)  sodium bicarbonate tablet 650 mg (650 mg Oral Given 07/21/22 1045)  hydrALAZINE (APRESOLINE) tablet 50 mg (50 mg Oral Given 07/21/22 0832)  amLODipine (NORVASC) tablet 10 mg (10 mg Oral Given 07/21/22 1045)  losartan (COZAAR) tablet 25 mg (25 mg Oral Given 07/21/22 1045)  rosuvastatin (CRESTOR) tablet 20 mg (has no administration in time range)  torsemide (DEMADEX) tablet 20 mg (20 mg Oral Given 07/21/22 1045)  insulin aspart (novoLOG) injection 0-9 Units ( Subcutaneous Not Given 07/21/22 1134)  insulin aspart (novoLOG) injection 0-5 Units (has no administration in time range)  heparin injection 5,000 Units (5,000 Units Subcutaneous Given 07/21/22 0836)  acetaminophen (TYLENOL) tablet 650 mg (has no administration in time range)    Or  acetaminophen (TYLENOL) suppository 650 mg (has no administration in time range)  ondansetron (ZOFRAN) tablet 4 mg (has no administration in time range)    Or  ondansetron (ZOFRAN) injection 4 mg (has no administration in time range)  hydrALAZINE (APRESOLINE) injection 10 mg (has no administration in time range)  dapagliflozin propanediol (FARXIGA) tablet 10 mg (10 mg Oral Given 07/21/22 1130)  Tdap (BOOSTRIX) injection 0.5 mL (0.5 mLs Intramuscular Given 07/20/22 2043)  sodium chloride 0.9 % bolus 1,000 mL (0 mLs Intravenous Stopped 07/20/22 2205)  acetaminophen (TYLENOL) tablet 1,000 mg (1,000 mg Oral Given 07/20/22 2043)  oxyCODONE (Oxy IR/ROXICODONE) immediate release tablet 5 mg (5 mg Oral Given 07/20/22 2205)    Mobility walks     Focused Assessments    R Recommendations: See Admitting Provider Note  Report given to:   Additional Notes:

## 2022-07-22 DIAGNOSIS — E119 Type 2 diabetes mellitus without complications: Secondary | ICD-10-CM

## 2022-07-22 DIAGNOSIS — R001 Bradycardia, unspecified: Secondary | ICD-10-CM | POA: Diagnosis not present

## 2022-07-22 DIAGNOSIS — R55 Syncope and collapse: Secondary | ICD-10-CM | POA: Diagnosis not present

## 2022-07-22 DIAGNOSIS — N184 Chronic kidney disease, stage 4 (severe): Secondary | ICD-10-CM | POA: Diagnosis not present

## 2022-07-22 DIAGNOSIS — I5032 Chronic diastolic (congestive) heart failure: Secondary | ICD-10-CM | POA: Diagnosis not present

## 2022-07-22 DIAGNOSIS — Z794 Long term (current) use of insulin: Secondary | ICD-10-CM | POA: Diagnosis not present

## 2022-07-22 LAB — BASIC METABOLIC PANEL
Anion gap: 9 (ref 5–15)
BUN: 22 mg/dL (ref 8–23)
CO2: 25 mmol/L (ref 22–32)
Calcium: 10.8 mg/dL — ABNORMAL HIGH (ref 8.9–10.3)
Chloride: 106 mmol/L (ref 98–111)
Creatinine, Ser: 2.03 mg/dL — ABNORMAL HIGH (ref 0.44–1.00)
GFR, Estimated: 25 mL/min — ABNORMAL LOW (ref >60)
Glucose, Bld: 205 mg/dL — ABNORMAL HIGH (ref 70–99)
Potassium: 4.2 mmol/L (ref 3.5–5.1)
Sodium: 140 mmol/L (ref 135–145)

## 2022-07-22 LAB — CBC WITH DIFFERENTIAL/PLATELET
Abs Immature Granulocytes: 0.01 10*3/uL (ref 0.00–0.07)
Basophils Absolute: 0.1 10*3/uL (ref 0.0–0.1)
Basophils Relative: 1 %
Eosinophils Absolute: 0.3 10*3/uL (ref 0.0–0.5)
Eosinophils Relative: 4 %
HCT: 37.8 % (ref 36.0–46.0)
Hemoglobin: 11.6 g/dL — ABNORMAL LOW (ref 12.0–15.0)
Immature Granulocytes: 0 %
Lymphocytes Relative: 36 %
Lymphs Abs: 2.9 10*3/uL (ref 0.7–4.0)
MCH: 28.1 pg (ref 26.0–34.0)
MCHC: 30.7 g/dL (ref 30.0–36.0)
MCV: 91.5 fL (ref 80.0–100.0)
Monocytes Absolute: 0.6 10*3/uL (ref 0.1–1.0)
Monocytes Relative: 8 %
Neutro Abs: 4.1 10*3/uL (ref 1.7–7.7)
Neutrophils Relative %: 51 %
Platelets: 315 10*3/uL (ref 150–400)
RBC: 4.13 MIL/uL (ref 3.87–5.11)
RDW: 14.8 % (ref 11.5–15.5)
WBC: 7.9 10*3/uL (ref 4.0–10.5)
nRBC: 0 % (ref 0.0–0.2)

## 2022-07-22 LAB — GLUCOSE, CAPILLARY
Glucose-Capillary: 193 mg/dL — ABNORMAL HIGH (ref 70–99)
Glucose-Capillary: 186 mg/dL — ABNORMAL HIGH (ref 70–99)

## 2022-07-22 MED ORDER — LOSARTAN POTASSIUM 50 MG PO TABS: 50.0000 mg | ORAL_TABLET | Freq: Every day | ORAL | Status: DC

## 2022-07-22 NOTE — TOC Transition Note (Addendum)
Transition of Care [facility]) - CM/SW Discharge Note   Patient Details  Name: [name] MRN: [id number] Date of Birth: 11-10-1950  Transition of Care [facility]) CM/SW Contact:  Leone Haven, RN Phone Number: 07/22/2022, 10:13 AM   Clinical Narrative:     Patient is for dc today, she states her spouse will transport her home. She uses a walker and a cane.  PT eval ordered to see patient will await rec.  Per pt eval no PT f/u needed.         Patient Goals and CMS Choice      Discharge Placement                         Discharge Plan and Services Additional resources added to the After Visit Summary for                                       Social Determinants of Health (SDOH) Interventions SDOH Screenings   Food Insecurity: No Food Insecurity (07/21/2022)  Housing: Low Risk  (07/21/2022)  Transportation Needs: No Transportation Needs (07/21/2022)  Utilities: Not At Risk (07/21/2022)  Alcohol Screen: Low Risk  (12/23/2021)  Depression (PHQ2-9): Medium Risk (06/14/2022)  Financial Resource Strain: Medium Risk (12/23/2021)  Physical Activity: Insufficiently Active (12/23/2021)  Social Connections: Moderately Integrated (12/23/2021)  Stress: No Stress Concern Present (12/23/2021)  Tobacco Use: Low Risk  (07/20/2022)     Readmission Risk Interventions     No data to display

## 2022-07-22 NOTE — Care Management Obs Status (Signed)
MEDICARE OBSERVATION STATUS NOTIFICATION   Patient Details  Name: [name] MRN: [id number] Date of Birth: [birth year]   Medicare Observation Status Notification Given:  Yes    Leone Haven, RN 07/22/2022, 10:07 AM

## 2022-07-22 NOTE — Progress Notes (Signed)
Cardiologist:  Duke Salvia  Subjective:  Denies SSCP, palpitations or Dyspnea BP elevated   Objective:  Vitals:   07/22/22 0541 07/22/22 0600 07/22/22 0700 07/22/22 0747  BP: (!) 145/64   (!) 185/72  Pulse: (!) 54 (!) 50 (!) 57 (!) 59  Resp: Temp: 98.2 F (36.8 C)   98 F (36.7 C)  TempSrc: Oral   Oral  SpO2: 97% 96% 96% 97%  Weight: 79.7 kg     Height:        Intake/Output from previous day:  Intake/Output Summary (Last 24 hours) at 07/22/2022 0845 Last data filed at 07/22/2022 1610 Gross per 24 hour  Intake 720 ml  Output --  Net 720 ml    Physical Exam: Affect appropriate Healthy:  appears stated age HEENT: normal Neck supple with no adenopathy JVP normal no bruits no thyromegaly Lungs clear with no wheezing and good diaphragmatic motion Heart:  S1/S2 no murmur, no rub, gallop or click PMI normal Abdomen: benighn, BS positve, no tenderness, no AAA no bruit.  No HSM or HJR Distal pulses intact with no bruits No edema Neuro non-focal Skin warm and dry No muscular weakness   Lab Results: Basic Metabolic Panel: Recent Labs    07/20/22 2034 07/22/22 0114  NA 140 140  K 4.4 4.2  CL 106 106  CO2 24 25  GLUCOSE 81 205*  BUN 22 22  CREATININE 1.63* 2.03*  CALCIUM 10.4* 10.8*   Liver Function Tests: No results for input(s): "AST", "ALT", "ALKPHOS", "BILITOT", "PROT", "ALBUMIN" in the last 72 hours. No results for input(s): "LIPASE", "AMYLASE" in the last 72 hours. CBC: Recent Labs    07/20/22 2034 07/22/22 0114  WBC 10.5 7.9  NEUTROABS  --  4.1  HGB 11.6* 11.6*  HCT 36.7 37.8  MCV 91.8 91.5  PLT 317 315     Imaging: ECHOCARDIOGRAM COMPLETE  Result Date: 07/21/2022    ECHOCARDIOGRAM REPORT   Patient Name:   AVIGAYIL TON Date of Exam: 07/21/2022 Medical Rec #:  [id number]         Height:       64.0 in Accession #:    [id number]        Weight:       176.0 lb Date of Birth:  [birth year]         BSA:          1.853 m Patient Age:     71 years          BP:           134/50 mmHg Patient Gender: F                 HR:           50 bpm. Exam Location:  Inpatient Procedure: 2D Echo, Cardiac Doppler and Color Doppler Indications:    Syncope R55  History:        Patient has prior history of Echocardiogram examinations, most                 recent 04/17/2019. CHF; Risk Factors:Non-Smoker, Diabetes,                 Hypertension and Dyslipidemia.  Sonographer:    Dondra Prader RVT RCS Referring Phys: [id number] ERIC CHEN IMPRESSIONS  1. Left ventricular ejection fraction, by estimation, is 60 to 65%. The left ventricle has normal function. The left ventricle has no regional wall motion abnormalities. There is  mild concentric left ventricular hypertrophy. Left ventricular diastolic parameters are consistent with Grade I diastolic dysfunction (impaired relaxation).  2. Right ventricular systolic function is normal. The right ventricular size is mildly enlarged.  3. The mitral valve is degenerative. Trivial mitral valve regurgitation. No evidence of mitral stenosis.  4. The aortic valve is normal in structure. Aortic valve regurgitation is trivial. No aortic stenosis is present.  5. Aortic dilatation noted. There is mild dilatation of the ascending aorta, measuring 38 mm.  6. The inferior vena cava is normal in size with greater than 50% respiratory variability, suggesting right atrial pressure of 3 mmHg. FINDINGS  Left Ventricle: Left ventricular ejection fraction, by estimation, is 60 to 65%. The left ventricle has normal function. The left ventricle has no regional wall motion abnormalities. The left ventricular internal cavity size was normal in size. There is  mild concentric left ventricular hypertrophy. Left ventricular diastolic parameters are consistent with Grade I diastolic dysfunction (impaired relaxation). Normal left ventricular filling pressure. Right Ventricle: The right ventricular size is mildly enlarged. No increase in right ventricular wall  thickness. Right ventricular systolic function is normal. Left Atrium: Left atrial size was normal in size. Right Atrium: Right atrial size was normal in size. Pericardium: There is no evidence of pericardial effusion. Mitral Valve: The mitral valve is degenerative in appearance. There is mild calcification of the mitral valve leaflet(s). Mild mitral annular calcification. Trivial mitral valve regurgitation. No evidence of mitral valve stenosis. Tricuspid Valve: The tricuspid valve is normal in structure. Tricuspid valve regurgitation is trivial. No evidence of tricuspid stenosis. Aortic Valve: The aortic valve is normal in structure. Aortic valve regurgitation is trivial. No aortic stenosis is present. Aortic valve mean gradient measures 6.0 mmHg. Aortic valve peak gradient measures 10.5 mmHg. Aortic valve area, by VTI measures 2.27 cm. Pulmonic Valve: The pulmonic valve was normal in structure. Pulmonic valve regurgitation is mild. No evidence of pulmonic stenosis. Aorta: Aortic dilatation noted. There is mild dilatation of the ascending aorta, measuring 38 mm. Venous: The inferior vena cava is normal in size with greater than 50% respiratory variability, suggesting right atrial pressure of 3 mmHg. IAS/Shunts: No atrial level shunt detected by color flow Doppler.  LEFT VENTRICLE PLAX 2D LVIDd:         4.50 cm   Diastology LVIDs:         2.70 cm   LV e' medial:    4.67 cm/s LV PW:         1.20 cm   LV E/e' medial:  10.9 LV IVS:        1.70 cm   LV e' lateral:   9.98 cm/s LVOT diam:     2.10 cm   LV E/e' lateral: 5.1 LV SV:         86 LV SV Index:   46 LVOT Area:     3.46 cm  RIGHT VENTRICLE             IVC RV Basal diam:  4.20 cm     IVC diam: 1.30 cm RV S prime:     14.40 cm/s TAPSE (M-mode): 2.4 cm LEFT ATRIUM             Index        RIGHT ATRIUM           Index LA diam:        4.30 cm 2.32 cm/m   RA Area:     11.70 cm LA Vol (  A2C):   64.9 ml 35.03 ml/m  RA Volume:   27.00 ml  14.57 ml/m LA Vol (A4C):    50.0 ml 26.98 ml/m LA Biplane Vol: 58.6 ml 31.63 ml/m  AORTIC VALVE                     PULMONIC VALVE AV Area (Vmax):    2.14 cm      PV Vmax:          0.92 m/s AV Area (Vmean):   1.91 cm      PV Peak grad:     3.3 mmHg AV Area (VTI):     2.27 cm      PR End Diast Vel: 6.05 msec AV Vmax:           162.00 cm/s AV Vmean:          107.000 cm/s AV VTI:            0.379 m AV Peak Grad:      10.5 mmHg AV Mean Grad:      6.0 mmHg LVOT Vmax:         100.00 cm/s LVOT Vmean:        59.100 cm/s LVOT VTI:          0.248 m LVOT/AV VTI ratio: 0.65 AR Vena Contracta: 0.10 cm  AORTA Ao Root diam: 3.40 cm Ao Asc diam:  3.80 cm Ao Arch diam: 3.1 cm MITRAL VALVE                TRICUSPID VALVE MV Area (PHT): 2.77 cm     TR Peak grad:   14.7 mmHg MV Decel Time: 274 msec     TR Vmax:        192.00 cm/s MV E velocity: 50.90 cm/s MV A velocity: 100.00 cm/s  SHUNTS MV E/A ratio:  0.51         Systemic VTI:  0.25 m                             Systemic Diam: 2.10 cm Armanda Magic MD Electronically signed by Armanda Magic MD Signature Date/Time: 07/21/2022/1:20:59 PM    Final    CT Cervical Spine Wo Contrast  Result Date: 07/20/2022 CLINICAL DATA:  Status post trauma. EXAM: CT CERVICAL SPINE WITHOUT CONTRAST TECHNIQUE: Multidetector CT imaging of the cervical spine was performed without intravenous contrast. Multiplanar CT image reconstructions were also generated. RADIATION DOSE REDUCTION: This exam was performed according to the departmental dose-optimization program which includes automated exposure control, adjustment of the mA and/or kV according to patient size and/or use of iterative reconstruction technique. COMPARISON:  None Available. FINDINGS: Alignment: Normal. Skull base and vertebrae: No acute fracture. A metallic density fusion plate and screws are seen along the anterior aspects of the C4 and C5 vertebral bodies. Soft tissues and spinal canal: No prevertebral fluid or swelling. No visible canal hematoma. Disc levels:  Moderate to marked severity endplate sclerosis, marked severity anterior osteophyte formation and posterior bony spurring are seen at the levels of C2-C3, C3-C4, C4-C5, C5-C6 and C6-C7. Extensive calcification of the anterior longitudinal ligament is also present. There is marked severity narrowing of the anterior atlantoaxial articulation. There is surgical fusion of the C4-C5 level with moderate to marked severity intervertebral disc space narrowing seen at the levels of C5-C6 C6-C7 and C7-T1. Moderate severity, predominately posterior intervertebral disc space narrowing is seen at C2-C3 and  C3-C4. Bilateral marked severity multilevel facet joint hypertrophy is noted. Upper chest: Negative. Other: None. IMPRESSION: 1. No acute fracture or subluxation in the cervical spine. 2. Marked severity multilevel degenerative changes, as described above. 3. Prior surgical fusion of the C4-C5 level. Electronically Signed   By: Aram Candela M.D.   On: 07/20/2022 18:55   CT HEAD WO CONTRAST  Result Date: 07/20/2022 CLINICAL DATA:  Status post trauma. EXAM: CT HEAD WITHOUT CONTRAST TECHNIQUE: Contiguous axial images were obtained from the base of the skull through the vertex without intravenous contrast. RADIATION DOSE REDUCTION: This exam was performed according to the departmental dose-optimization program which includes automated exposure control, adjustment of the mA and/or kV according to patient size and/or use of iterative reconstruction technique. COMPARISON:  October 19, 2019 FINDINGS: Brain: There is mild cerebral atrophy with widening of the extra-axial spaces and ventricular dilatation. There are areas of decreased attenuation within the white matter tracts of the supratentorial brain, consistent with microvascular disease changes. Vascular: No hyperdense vessel or unexpected calcification. Skull: Normal. Negative for fracture or focal lesion. Sinuses/Orbits: No acute finding. Other: Mild scalp soft tissue  swelling is seen along the posterior aspect of the vertex on the left. IMPRESSION: 1. No acute intracranial abnormality. 2. Generalized cerebral atrophy and microvascular disease changes of the supratentorial brain. Electronically Signed   By: Aram Candela M.D.   On: 07/20/2022 18:52   DG Elbow 2 Views Right  Result Date: 07/20/2022 CLINICAL DATA:  Syncope, fall EXAM: RIGHT ELBOW - 2 VIEW COMPARISON:  None Available. FINDINGS: No displaced fracture or dislocation is seen. There are small linear calcific densities at the attachment of triceps to the olecranon process and adjacent to the medial epicondyle of distal humerus. This finding may suggest calcific bursitis or tendinosis. There is no displacement of posterior fat pad. There is soft tissue swelling over the olecranon process. IMPRESSION: No recent fracture or dislocation is seen in right elbow. Small linear calcifications are noted in soft tissues adjacent to the olecranon process and medial epicondyle of distal humerus suggesting calcific tendinosis. Electronically Signed   By: Ernie Avena M.D.   On: 07/20/2022 18:23    Cardiac Studies:  ECG: SRSB no acute changes no AV block   Telemetry:  NSR 07/22/2022   Echo: EF 60-65%   Medications:    amLODipine  10 mg Oral Daily   dapagliflozin propanediol  10 mg Oral Daily   heparin  5,000 Units Subcutaneous Q8H   hydrALAZINE  50 mg Oral BID   insulin aspart  0-5 Units Subcutaneous QHS   insulin aspart  0-9 Units Subcutaneous TID WC   levothyroxine  100 mcg Oral Daily   losartan  25 mg Oral Daily   pantoprazole  40 mg Oral BID   rosuvastatin  20 mg Oral QHS   sodium bicarbonate  650 mg Oral BID   torsemide  20 mg Oral BID      Assessment/Plan:   Sycnpe: sounds more vagal No arrhythmia or acute ECG changes no chest pain TTE with normal EF Ok to d/c home 30 day monitor arranged HTN:  continue toresemide norvasc, and hydralazine Increase losartan to 50 mg daily  HLD:  continue  statin   Charlton Haws 07/22/2022, 8:45 AM

## 2022-07-22 NOTE — Evaluation (Signed)
Physical Therapy Evaluation Patient Details Name: [name] MRN: [id number] DOB: 1950-12-26 Today's Date: 07/22/2022  History of Present Illness  72 year old female admitted on 07/20/22 for syncope. Past hx of DM2, HTN, CKD stage 4, hyperlipidemia, chronic diastolic CHF.  Clinical Impression  Pt presents with admitting diagnosis above. Pt was able to ambulate in hallway and navigate stairs Mod I. Pt presents at or near baseline mobility. Pt has no further acute PT needs and will be signing off. Pt also has no post acute PT or DME needs. Re consult PT if mobility status changes.     Recommendations for follow up therapy are one component of a multi-disciplinary discharge planning process, led by the attending physician.  Recommendations may be updated based on patient status, additional functional criteria and insurance authorization.  Follow Up Recommendations       Assistance Recommended at Discharge PRN  Patient can return home with the following  Assist for transportation    Equipment Recommendations None recommended by PT  Recommendations for Other Services       Functional Status Assessment Patient has had a recent decline in their functional status and demonstrates the ability to make significant improvements in function in a reasonable and predictable amount of time.     Precautions / Restrictions Precautions Precautions: Fall Restrictions Weight Bearing Restrictions: No      Mobility  Bed Mobility Overal bed mobility: Modified Independent             General bed mobility comments: Use of bedrail    Transfers Overall transfer level: Modified independent Equipment used: Rolling walker (2 wheels)                    Ambulation/Gait Ambulation/Gait assistance: Modified independent (Device/Increase time) Gait Distance (Feet): 600 Feet Assistive device: Rolling walker (2 wheels) Gait Pattern/deviations: WFL(Within Functional Limits) Gait  velocity: decreased     General Gait Details: no LOB noted  Stairs Stairs: Yes Stairs assistance: Modified independent (Device/Increase time) Stair Management: One rail Left, Step to pattern, Forwards, Sideways Number of Stairs: 4 General stair comments: no LOB noted. Pt went up forwards but came down sideways  Wheelchair Mobility    Modified Rankin (Stroke Patients Only)       Balance Overall balance assessment: No apparent balance deficits (not formally assessed)                                           Pertinent Vitals/Pain Pain Assessment Pain Assessment: No/denies pain    Home Living Family/patient expects to be discharged to:: Private residence Living Arrangements: Spouse/significant other Available Help at Discharge: Family;Available 24 hours/day Type of Home: House Home Access: Stairs to enter Entrance Stairs-Rails: Left Entrance Stairs-Number of Steps: 4   Home Layout: One level Home Equipment: Agricultural consultant (2 wheels);Cane - quad;Cane - single point;BSC/3in1;Grab bars - tub/shower;Hand held shower head      Prior Function Prior Level of Function : Independent/Modified Independent;History of Falls (last six months)             Mobility Comments: RW at home but mostly uses cane in the community ADLs Comments: Ind     Hand Dominance   Dominant Hand: Right    Extremity/Trunk Assessment   Upper Extremity Assessment Upper Extremity Assessment: Overall WFL for tasks assessed    Lower Extremity Assessment Lower Extremity Assessment:  Overall [facility] for tasks assessed    Cervical / Trunk Assessment Cervical / Trunk Assessment: Normal  Communication   Communication: No difficulties  Cognition Arousal/Alertness: Awake/alert Behavior During Therapy: WFL for tasks assessed/performed Overall Cognitive Status: Within Functional Limits for tasks assessed                                          General Comments  General comments (skin integrity, edema, etc.): BP: 126/65 seated, BP: 106/59 standing. Pt reports no symptoms.    Exercises     Assessment/Plan    PT Assessment Patient does not need any further PT services  PT Problem List         PT Treatment Interventions      PT Goals (Current goals can be found in the Care Plan section)       Frequency       Co-evaluation               AM-PAC PT "6 Clicks" Mobility  Outcome Measure Help needed turning from your back to your side while in a flat bed without using bedrails?: None Help needed moving from lying on your back to sitting on the side of a flat bed without using bedrails?: None Help needed moving to and from a bed to a chair (including a wheelchair)?: None Help needed standing up from a chair using your arms (e.g., wheelchair or bedside chair)?: None Help needed to walk in hospital room?: None Help needed climbing 3-5 steps with a railing? : None 6 Click Score: 24    End of Session Equipment Utilized During Treatment: Gait belt Activity Tolerance: Patient tolerated treatment well Patient left: in bed;with call bell/phone within reach (Seated EOB) Nurse Communication: Mobility status PT Visit Diagnosis: Other abnormalities of gait and mobility (R26.89)    Time: 6015-6153 PT Time Calculation (min) (ACUTE ONLY): 27 min   Charges:   PT Evaluation $PT Eval Low Complexity: 1 Low PT Treatments $Gait Training: 8-22 mins        [name], PT, DPT Acute Rehab Services [phone]   [name] 07/22/2022, 12:30 PM

## 2022-07-22 NOTE — Plan of Care (Signed)

## 2022-07-22 NOTE — Discharge Summary (Signed)
Discharge Summary  [name]:[id number] DOB: 02-22-1951  PCP: Sheliah Hatch, MD  Admit date: 07/20/2022 Discharge date: 07/22/2022    Time spent:   Recommendations for Outpatient Follow-up:  F/u with PCP within a week  for hospital discharge follow up, repeat cbc/bmp at follow up, elbow wound check by pcp at follow up F/u with cardiology, event monitor arranged Patient is advised to check blood pressure twice a day , bring record for pcp and cardiology to review  Meds change at discharge: hold coreg due to bradycardia  Continue the rest of home meds   Discharge Diagnoses:  Active Hospital Problems   Diagnosis Date Noted   Syncope 74/02/2023    Priority: High   Sinus bradycardia 07/21/2022    Priority: Medium    Hyperlipidemia associated with type 2 diabetes mellitus 02/01/2021    Priority: Low   Chronic renal impairment, stage 4 (severe) 10/28/2019    Priority: Low   Postsurgical hypothyroidism 06/11/2018    Priority: Low   Essential hypertension     Priority: Low   Type 2 diabetes mellitus with diabetic chronic kidney disease     Priority: Low   Chronic diastolic heart failure 11/17/2014    Priority: Low    Resolved Hospital Problems  No resolved problems to display.    Discharge Condition: stable  Diet recommendation: heart healthy/carb modified  Filed Weights   07/20/22 1736 07/21/22 1642 07/22/22 0541  Weight: 79.8 kg 79.7 kg 79.7 kg    History of present illness: ( per admitting provider Dr Imogene Burn) PCP: Sheliah Hatch, MD    Patient coming from:  [employer]   I have personally briefly reviewed patient's old medical records in [place] [facility]   CC: syncope 72 yo AAF with hx of DM2, HTN, CKD stage 4, hyperlipidemia, chronic diastolic CHF, presents to ER after syncope at [facility] today.  Patient states that she was standing in line at checkout.  She states that she felt very lightheaded.  She states that her vision started going  black.  She states that she fell to the floor and hit her head.  Reported that her blood glucose was 70 at the time of her single episode.  Patient states that she did take 34 units of 70/30 insulin this morning.  She had 2 scrambled eggs.  She did not eat lunch.  She went to [facility] around 2 PM.   After she passed out, EMS was called.  She was given some glucose tabs.  Heart rate was in the 50s.   Patient transferred to the ER for.   On arrival temp 98.8 heart rate 55 blood pressure 147/108 satting 92% on room air.   Labs showed a white count of 10.5, hemoglobin of 11.6, platelets of 317   Sodium 140, potassium 4.4, chloride of 106, BUN of 22, creatinine 1.6, glucose of 81   CT head demonstrates no acute intracranial findings.   CT C-spine negative for acute fractures.  Prior fusion of C4 and C5.   Right elbow x-ray negative for fracture.   EKG on my interpretation showed sinus bradycardia with a heart rate of 54.   Due to the patient's unexplained syncopal episode, [facility] hospitalist contacted for admission.    ED Course: CT head negative. EKG shows sinus bradycardia  Hospital Course:  Principal Problem:   Syncope Active Problems:   Sinus bradycardia   Chronic diastolic heart failure   Type 2 diabetes mellitus with diabetic chronic kidney disease  Essential hypertension   Postsurgical hypothyroidism   Chronic renal impairment, stage 4 (severe)   Hyperlipidemia associated with type 2 diabetes mellitus   Assessment and Plan:  Syncope : 74T head cervical spine no acute findings, echocardiogram with preserved LVEF , orthostatic vital sign obtained after hydration largely unremarkable, did not report feeling dizzy upon standing Syncope thought to be multifactorial including dehydration/hypoglycemia ( skipped lunch), seen by cardiology , event monitor arranged.    Sinus bradycardia:  TSH 1.7, seen by cardiology , 30-day event monitor arranged, continue hold Coreg until  follow-up with cardiology outpatient   Right elbow skin tear, s/p tdap in the ED  Right elbow x-ray : no recent fracture or dislocation is seen in right elbow. Small linear calcifications are noted in soft tissues adjacent to the olecranon process and medial epicondyle of distal humerus suggesting calcific tendinosis. F/u with pcp  Chronic diastolic heart failure Chronic. Stable. Not volume overloaded at this point. Holding coreg due to bradycardia, continue demadex, farxiga F/u with cardiology  Type 2 diabetes mellitus with diabetic chronic kidney disease A1c 8.4% last month.  Given BS of 70 around time of syncope, likely due to skipping lunch Continue home diabetes regimen, f/u with pcp  Hyperlipidemia associated with type 2 diabetes mellitus Stable.  Chronic renal impairment, stage 4 (severe) Chronic. Follows with Dr. Malen Gauze with [facility] as outpatient.  Postsurgical hypothyroidism Stable. TSH 1.7 last month  Essential hypertension Bp stable,  Holding coreg due to bradycardia.   Discharge Exam: BP (!) 127/55   Pulse (!) 52   Temp 98.6 F (37 C) (Oral)   Resp 17   Ht 5\' 4"  (1.626 m)   Wt 79.7 kg   SpO2 93%   BMI 30.16 kg/m   General: NAD Cardiovascular: sinus bradycardia Respiratory: normal respiratory effort     Discharge Instructions     Diet Carb Modified   Complete by: As directed    Please do not skip meals, please ensure adequate hydration   Discharge wound care:   Complete by: As directed    F/u with pcp for wound check   Increase activity slowly   Complete by: As directed       Allergies as of 07/22/2022       Reactions   Dilaudid [hydromorphone Hcl] Nausea And Vomiting   Hydromorphone Nausea And Vomiting   Lipitor [atorvastatin] Other (See Comments)   Body & Muscle Aches   Adhesive [tape] Rash   Ampicillin Rash, Other (See Comments)   Also developed welts on her hands when she took it in the hospital   Latex Itching, Dermatitis,  Rash, Other (See Comments)   Patient had a reaction to tape after surgery and they told her she was allergic to Latex   Penicillins Rash, Other (See Comments)   Has patient had a PCN reaction causing immediate rash, facial/tongue/throat swelling, SOB or lightheadedness with hypotension: No Has patient had a PCN reaction causing severe rash involving mucus membranes or skin necrosis: No Has patient had a PCN reaction that required hospitalization: Yes - in hospital receiving treatment Has patient had a PCN reaction occurring within the last 10 years: No If all of the above answers are "NO", then may proceed with Cephalosporin use.        Medication List     STOP taking these medications    carvedilol 6.25 MG tablet Commonly known as: COREG       TAKE these medications    acetaminophen 500  MG tablet Commonly known as: TYLENOL Take 500-1,000 mg by mouth every 6 (six) hours as needed for moderate pain, headache or mild pain.   amLODipine 10 MG tablet Commonly known as: NORVASC Take 1 tablet (10 mg total) by mouth daily.   aspirin 81 MG tablet Take 81 mg by mouth daily.   B-D SINGLE USE SWABS REGULAR Pads Use as directed.   DULoxetine 20 MG capsule Commonly known as: CYMBALTA Take 20 mg by mouth daily.   famotidine 40 MG tablet Commonly known as: PEPCID TAKE 1 TABLET BY MOUTH EVERY DAY   Farxiga 10 MG Tabs tablet Generic drug: dapagliflozin propanediol Take by mouth daily.   glipiZIDE 5 MG 24 hr tablet Commonly known as: GLUCOTROL XL Take 1 tablet by mouth daily.   hydrALAZINE 50 MG tablet Commonly known as: APRESOLINE TAKE 1 TABLET BY MOUTH TWICE A DAY   levothyroxine 100 MCG tablet Commonly known as: SYNTHROID Take 1 tablet (100 mcg total) by mouth daily.   losartan 25 MG tablet Commonly known as: COZAAR Take 1 tablet (25 mg total) by mouth daily.   metoCLOPramide 10 MG tablet Commonly known as: REGLAN TAKE 1 TABLET FOUR TIMES DAILY What changed: See  the new instructions.   NovoLIN 70/30 (70-30) 100 UNIT/ML injection Generic drug: insulin NPH-regular Human Inject 30 Units into the skin 2 (two) times daily with a meal.   pantoprazole 40 MG tablet Commonly known as: PROTONIX Take 1 tablet (40 mg total) by mouth 2 (two) times daily.   polyethylene glycol 17 g packet Commonly known as: MIRALAX / GLYCOLAX Take 17 g by mouth daily as needed for moderate constipation (MIX AND DRINK).   rosuvastatin 20 MG tablet Commonly known as: CRESTOR TAKE 1 TABLET AT BEDTIME What changed: when to take this   sodium bicarbonate 650 MG tablet Take 650 mg by mouth 2 (two) times daily.   SUMAtriptan 50 MG tablet Commonly known as: IMITREX TAKE 1 TAB EVERY 2 HRS AS NEEDED FOR MIGRAINE. MAY REPEAT IN 2 HOURS IF HEADACHE PERSISTS OR RECURS. What changed: See the new instructions.   Systane Ultra 0.4-0.3 % Soln Generic drug: Polyethyl Glycol-Propyl Glycol Apply 1-2 drops to eye daily as needed (dry eyes).   tizanidine 2 MG capsule Commonly known as: ZANAFLEX Take 1 capsule (2 mg total) by mouth 3 (three) times daily as needed for muscle spasms.   torsemide 20 MG tablet Commonly known as: DEMADEX Take 1 tablet (20 mg total) by mouth 2 (two) times daily.   True Metrix Air Glucose Meter w/Device Kit 1 kit by Does not apply route 3 (three) times daily.   True Metrix Blood Glucose Test test strip Generic drug: glucose blood Use as instructed   TRUEplus Lancets 33G Misc Use as directed   Vitamin D (Ergocalciferol) 1.25 MG (50000 UNIT) Caps capsule Commonly known as: DRISDOL Take 1 capsule (50,000 Units total) by mouth every 7 (seven) days.   Vitamin D 50 MCG (2000 UT) tablet Take 2,000 Units by mouth daily.               Discharge Care Instructions  (From admission, onward)           Start     Ordered   07/22/22 0000  Discharge wound care:       Comments: F/u with pcp for wound check   07/22/22 0946            Allergies  Allergen Reactions   Dilaudid [Hydromorphone Hcl]  Nausea And Vomiting   Hydromorphone Nausea And Vomiting   Lipitor [Atorvastatin] Other (See Comments)    Body & Muscle Aches   Adhesive [Tape] Rash   Ampicillin Rash and Other (See Comments)    Also developed welts on her hands when she took it in the hospital   Latex Itching, Dermatitis, Rash and Other (See Comments)    Patient had a reaction to tape after surgery and they told her she was allergic to Latex    Penicillins Rash and Other (See Comments)    Has patient had a PCN reaction causing immediate rash, facial/tongue/throat swelling, SOB or lightheadedness with hypotension: No Has patient had a PCN reaction causing severe rash involving mucus membranes or skin necrosis: No Has patient had a PCN reaction that required hospitalization: Yes - in hospital receiving treatment Has patient had a PCN reaction occurring within the last 10 years: No If all of the above answers are "NO", then may proceed with Cephalosporin use.     Follow-up Information     Sheliah Hatch, MD Follow up in 1 week(s).   Specialty: Family Medicine Why: hospital discharge follow up, please check your blood pressure and heart rate twice a day, please record your readings and bring to your pcp and heart doctors  pcp to repeat basic blood works including cbc/bmp elbow wound check by pcp at follow up Contact information: [address] [place] [address] [phone]         Sheliah Hatch, MD. Go on 07/25/2022.   Specialty: Family Medicine Why: @11 :Azzie Roup information: [address] [place] [address] [phone]                  The results of significant diagnostics from this hospitalization (including imaging, microbiology, ancillary and laboratory) are listed below for reference.    Significant Diagnostic Studies: ECHOCARDIOGRAM COMPLETE  Result Date: 07/21/2022    ECHOCARDIOGRAM REPORT    Patient Name:   [name] Date of Exam: 07/21/2022 Medical Rec #:  [id number]         Height:       64.0 in Accession #:    [id number]        Weight:       176.0 lb Date of Birth:  June 13, 1950         BSA:          1.853 m Patient Age:    71 years          BP:           134/50 mmHg Patient Gender: F                 HR:           50 bpm. Exam Location:  Inpatient Procedure: 2D Echo, Cardiac Doppler and Color Doppler Indications:    Syncope R55  History:        Patient has prior history of Echocardiogram examinations, most                 recent 04/17/2019. CHF; Risk Factors:Non-Smoker, Diabetes,                 Hypertension and Dyslipidemia.  Sonographer:    Dondra Prader RVT RCS Referring Phys: [id number] ERIC CHEN IMPRESSIONS  1. Left ventricular ejection fraction, by estimation, is 60 to 65%. The left ventricle has normal function. The left ventricle has no regional wall motion abnormalities. There  is mild concentric left ventricular hypertrophy. Left ventricular diastolic parameters are consistent with Grade I diastolic dysfunction (impaired relaxation).  2. Right ventricular systolic function is normal. The right ventricular size is mildly enlarged.  3. The mitral valve is degenerative. Trivial mitral valve regurgitation. No evidence of mitral stenosis.  4. The aortic valve is normal in structure. Aortic valve regurgitation is trivial. No aortic stenosis is present.  5. Aortic dilatation noted. There is mild dilatation of the ascending aorta, measuring 38 mm.  6. The inferior vena cava is normal in size with greater than 50% respiratory variability, suggesting right atrial pressure of 3 mmHg. FINDINGS  Left Ventricle: Left ventricular ejection fraction, by estimation, is 60 to 65%. The left ventricle has normal function. The left ventricle has no regional wall motion abnormalities. The left ventricular internal cavity size was normal in size. There is  mild concentric left ventricular hypertrophy. Left ventricular  diastolic parameters are consistent with Grade I diastolic dysfunction (impaired relaxation). Normal left ventricular filling pressure. Right Ventricle: The right ventricular size is mildly enlarged. No increase in right ventricular wall thickness. Right ventricular systolic function is normal. Left Atrium: Left atrial size was normal in size. Right Atrium: Right atrial size was normal in size. Pericardium: There is no evidence of pericardial effusion. Mitral Valve: The mitral valve is degenerative in appearance. There is mild calcification of the mitral valve leaflet(s). Mild mitral annular calcification. Trivial mitral valve regurgitation. No evidence of mitral valve stenosis. Tricuspid Valve: The tricuspid valve is normal in structure. Tricuspid valve regurgitation is trivial. No evidence of tricuspid stenosis. Aortic Valve: The aortic valve is normal in structure. Aortic valve regurgitation is trivial. No aortic stenosis is present. Aortic valve mean gradient measures 6.0 mmHg. Aortic valve peak gradient measures 10.5 mmHg. Aortic valve area, by VTI measures 2.27 cm. Pulmonic Valve: The pulmonic valve was normal in structure. Pulmonic valve regurgitation is mild. No evidence of pulmonic stenosis. Aorta: Aortic dilatation noted. There is mild dilatation of the ascending aorta, measuring 38 mm. Venous: The inferior vena cava is normal in size with greater than 50% respiratory variability, suggesting right atrial pressure of 3 mmHg. IAS/Shunts: No atrial level shunt detected by color flow Doppler.  LEFT VENTRICLE PLAX 2D LVIDd:         4.50 cm   Diastology LVIDs:         2.70 cm   LV e' medial:    4.67 cm/s LV PW:         1.20 cm   LV E/e' medial:  10.9 LV IVS:        1.70 cm   LV e' lateral:   9.98 cm/s LVOT diam:     2.10 cm   LV E/e' lateral: 5.1 LV SV:         86 LV SV Index:   46 LVOT Area:     3.46 cm  RIGHT VENTRICLE             IVC RV Basal diam:  4.20 cm     IVC diam: 1.30 cm RV S prime:     14.40 cm/s  TAPSE (M-mode): 2.4 cm LEFT ATRIUM             Index        RIGHT ATRIUM           Index LA diam:        4.30 cm 2.32 cm/m   RA Area:     11.70 cm  LA Vol (A2C):   64.9 ml 35.03 ml/m  RA Volume:   27.00 ml  14.57 ml/m LA Vol (A4C):   50.0 ml 26.98 ml/m LA Biplane Vol: 58.6 ml 31.63 ml/m  AORTIC VALVE                     PULMONIC VALVE AV Area (Vmax):    2.14 cm      PV Vmax:          0.92 m/s AV Area (Vmean):   1.91 cm      PV Peak grad:     3.3 mmHg AV Area (VTI):     2.27 cm      PR End Diast Vel: 6.05 msec AV Vmax:           162.00 cm/s AV Vmean:          107.000 cm/s AV VTI:            0.379 m AV Peak Grad:      10.5 mmHg AV Mean Grad:      6.0 mmHg LVOT Vmax:         100.00 cm/s LVOT Vmean:        59.100 cm/s LVOT VTI:          0.248 m LVOT/AV VTI ratio: 0.65 AR Vena Contracta: 0.10 cm  AORTA Ao Root diam: 3.40 cm Ao Asc diam:  3.80 cm Ao Arch diam: 3.1 cm MITRAL VALVE                TRICUSPID VALVE MV Area (PHT): 2.77 cm     TR Peak grad:   14.7 mmHg MV Decel Time: 274 msec     TR Vmax:        192.00 cm/s MV E velocity: 50.90 cm/s MV A velocity: 100.00 cm/s  SHUNTS MV E/A ratio:  0.51         Systemic VTI:  0.25 m                             Systemic Diam: 2.10 cm Armanda Magic MD Electronically signed by Armanda Magic MD Signature Date/Time: 07/21/2022/1:20:59 PM    Final    CT Cervical Spine Wo Contrast  Result Date: 07/20/2022 CLINICAL DATA:  Status post trauma. EXAM: CT CERVICAL SPINE WITHOUT CONTRAST TECHNIQUE: Multidetector CT imaging of the cervical spine was performed without intravenous contrast. Multiplanar CT image reconstructions were also generated. RADIATION DOSE REDUCTION: This exam was performed according to the departmental dose-optimization program which includes automated exposure control, adjustment of the mA and/or kV according to patient size and/or use of iterative reconstruction technique. COMPARISON:  None Available. FINDINGS: Alignment: Normal. Skull base and vertebrae: No  acute fracture. A metallic density fusion plate and screws are seen along the anterior aspects of the C4 and C5 vertebral bodies. Soft tissues and spinal canal: No prevertebral fluid or swelling. No visible canal hematoma. Disc levels: Moderate to marked severity endplate sclerosis, marked severity anterior osteophyte formation and posterior bony spurring are seen at the levels of C2-C3, C3-C4, C4-C5, C5-C6 and C6-C7. Extensive calcification of the anterior longitudinal ligament is also present. There is marked severity narrowing of the anterior atlantoaxial articulation. There is surgical fusion of the C4-C5 level with moderate to marked severity intervertebral disc space narrowing seen at the levels of C5-C6 C6-C7 and C7-T1. Moderate severity, predominately posterior intervertebral disc space narrowing is seen at C2-C3  and C3-C4. Bilateral marked severity multilevel facet joint hypertrophy is noted. Upper chest: Negative. Other: None. IMPRESSION: 1. No acute fracture or subluxation in the cervical spine. 2. Marked severity multilevel degenerative changes, as described above. 3. Prior surgical fusion of the C4-C5 level. Electronically Signed   By: Aram Candela M.D.   On: 07/20/2022 18:55   CT HEAD WO CONTRAST  Result Date: 07/20/2022 CLINICAL DATA:  Status post trauma. EXAM: CT HEAD WITHOUT CONTRAST TECHNIQUE: Contiguous axial images were obtained from the base of the skull through the vertex without intravenous contrast. RADIATION DOSE REDUCTION: This exam was performed according to the departmental dose-optimization program which includes automated exposure control, adjustment of the mA and/or kV according to patient size and/or use of iterative reconstruction technique. COMPARISON:  October 19, 2019 FINDINGS: Brain: There is mild cerebral atrophy with widening of the extra-axial spaces and ventricular dilatation. There are areas of decreased attenuation within the white matter tracts of the supratentorial  brain, consistent with microvascular disease changes. Vascular: No hyperdense vessel or unexpected calcification. Skull: Normal. Negative for fracture or focal lesion. Sinuses/Orbits: No acute finding. Other: Mild scalp soft tissue swelling is seen along the posterior aspect of the vertex on the left. IMPRESSION: 1. No acute intracranial abnormality. 2. Generalized cerebral atrophy and microvascular disease changes of the supratentorial brain. Electronically Signed   By: Aram Candela M.D.   On: 07/20/2022 18:52   DG Elbow 2 Views Right  Result Date: 07/20/2022 CLINICAL DATA:  Syncope, fall EXAM: RIGHT ELBOW - 2 VIEW COMPARISON:  None Available. FINDINGS: No displaced fracture or dislocation is seen. There are small linear calcific densities at the attachment of triceps to the olecranon process and adjacent to the medial epicondyle of distal humerus. This finding may suggest calcific bursitis or tendinosis. There is no displacement of posterior fat pad. There is soft tissue swelling over the olecranon process. IMPRESSION: No recent fracture or dislocation is seen in right elbow. Small linear calcifications are noted in soft tissues adjacent to the olecranon process and medial epicondyle of distal humerus suggesting calcific tendinosis. Electronically Signed   By: Ernie Avena M.D.   On: 07/20/2022 18:23   NM Parathyroid W/Spect  Result Date: 06/29/2022 CLINICAL DATA:  Hypercalcemia, prior partial thyroidectomy EXAM: NM PARATHYROID SCINTIGRAPHY AND SPECT IMAGING TECHNIQUE: Following intravenous administration of radiopharmaceutical, early and 2-hour delayed planar images were obtained in the anterior projection. Delayed triplanar SPECT images were also obtained at 2 hours. RADIOPHARMACEUTICALS:  24 mCi Tc-33m Sestamibi IV COMPARISON:  Thyroid ultrasound 06/26/2020 FINDINGS: Planar imaging: Prior RIGHT thyroid lobectomy. Initial image demonstrates normal distribution of sestamibi in the LEFT lobe.  Delayed image demonstrates washout of tracer from LEFT lobe with abnormal sestamibi retention at the inferior pole of the LEFT thyroid lobe. SPECT imaging: Focal abnormal sestamibi retention inferiorly posteriorly at the inferior pole the LEFT thyroid lobe highly suspicious for a LEFT inferior parathyroid adenoma. No abnormal tracer retention at the expected position of the LEFT superior parathyroid gland. No uptake of tracer at the RIGHT thyroid bed nor ectopically in the mediastinum. IMPRESSION: Focal abnormal sestamibi retention suspicious for a LEFT inferior parathyroid adenoma. Prior RIGHT thyroid lobectomy. Electronically Signed   By: Ulyses Southward M.D.   On: 06/29/2022 14:24    Microbiology: No results found for this or any previous visit (from the past 240 hour(s)).   Labs: Basic Metabolic Panel: Recent Labs  Lab 07/20/22 2034 07/22/22 0114  NA 140 140  K 4.4 4.2  CL  106 106  CO2 24 25  GLUCOSE 81 205*  BUN 22 22  CREATININE 1.63* 2.03*  CALCIUM 10.4* 10.8*   Liver Function Tests: No results for input(s): "AST", "ALT", "ALKPHOS", "BILITOT", "PROT", "ALBUMIN" in the last 168 hours. No results for input(s): "LIPASE", "AMYLASE" in the last 168 hours. No results for input(s): "AMMONIA" in the last 168 hours. CBC: Recent Labs  Lab 07/20/22 2034 07/22/22 0114  WBC 10.5 7.9  NEUTROABS  --  4.1  HGB 11.6* 11.6*  HCT 36.7 37.8  MCV 91.8 91.5  PLT 317 315   Cardiac Enzymes: No results for input(s): "CKTOTAL", "CKMB", "CKMBINDEX", "TROPONINI" in the last 168 hours. BNP: BNP (last 3 results) No results for input(s): "BNP" in the last 8760 hours.  ProBNP (last 3 results) No results for input(s): "PROBNP" in the last 8760 hours.  CBG: Recent Labs  Lab 07/21/22 1133 07/21/22 1722 07/21/22 2117 07/22/22 0641 07/22/22 1039  GLUCAP 102* 160* 143* 193* 186*    FURTHER DISCHARGE INSTRUCTIONS:   Get Medicines reviewed and adjusted: Please take all your medications with you  for your next visit with your Primary MD   Laboratory/radiological data: Please request your Primary MD to go over all hospital tests and procedure/radiological results at the follow up, please ask your Primary MD to get all Hospital records sent to his/her office.   In some cases, they will be blood work, cultures and biopsy results pending at the time of your discharge. Please request that your primary care M.D. goes through all the records of your hospital data and follows up on these results.   Also Note the following: If you experience worsening of your admission symptoms, develop shortness of breath, life threatening emergency, suicidal or homicidal thoughts you must seek medical attention immediately by calling 911 or calling your MD immediately  if symptoms less severe.   You must read complete instructions/literature along with all the possible adverse reactions/side effects for all the Medicines you take and that have been prescribed to you. Take any new Medicines after you have completely understood and accpet all the possible adverse reactions/side effects.    Do not drive when taking Pain medications or sleeping medications (Benzodaizepines)   Do not take more than prescribed Pain, Sleep and Anxiety Medications. It is not advisable to combine anxiety,sleep and pain medications without talking with your primary care practitioner   Special Instructions: If you have smoked or chewed Tobacco  in the last 2 yrs please stop smoking, stop any regular Alcohol  and or any Recreational drug use.   Wear Seat belts while driving.   Please note: You were cared for by a hospitalist during your hospital stay. Once you are discharged, your primary care physician will handle any further medical issues. Please note that NO REFILLS for any discharge medications will be authorized once you are discharged, as it is imperative that you return to your primary care physician (or establish a relationship  with a primary care physician if you do not have one) for your post hospital discharge needs so that they can reassess your need for medications and monitor your lab values.     Signed:  Albertine Grates MD, PhD, FACP  [facility] 07/22/2022, 10:46 PM

## 2022-07-25 ENCOUNTER — Encounter: Payer: Self-pay | Admitting: Family Medicine

## 2022-07-25 ENCOUNTER — Ambulatory Visit (INDEPENDENT_AMBULATORY_CARE_PROVIDER_SITE_OTHER): Payer: Medicare HMO | Admitting: Family Medicine

## 2022-07-25 VITALS — BP 136/62 | HR 60 | Temp 97.8°F | Resp 17 | Ht 64.0 in | Wt 179.2 lb

## 2022-07-25 DIAGNOSIS — J301 Allergic rhinitis due to pollen: Secondary | ICD-10-CM | POA: Diagnosis not present

## 2022-07-25 DIAGNOSIS — R55 Syncope and collapse: Secondary | ICD-10-CM

## 2022-07-25 DIAGNOSIS — R079 Chest pain, unspecified: Secondary | ICD-10-CM

## 2022-07-25 DIAGNOSIS — J309 Allergic rhinitis, unspecified: Secondary | ICD-10-CM | POA: Insufficient documentation

## 2022-07-25 LAB — CBC WITH DIFFERENTIAL/PLATELET
Basophils Absolute: 0 10*3/uL (ref 0.0–0.1)
Basophils Relative: 0.5 % (ref 0.0–3.0)
Eosinophils Absolute: 0.4 10*3/uL (ref 0.0–0.7)
Eosinophils Relative: 5.1 % — ABNORMAL HIGH (ref 0.0–5.0)
HCT: 38.1 % (ref 36.0–46.0)
Hemoglobin: 12.5 g/dL (ref 12.0–15.0)
Lymphocytes Relative: 25.9 % (ref 12.0–46.0)
Lymphs Abs: 1.8 10*3/uL (ref 0.7–4.0)
MCHC: 32.7 g/dL (ref 30.0–36.0)
MCV: 88.2 fl (ref 78.0–100.0)
Monocytes Absolute: 0.6 10*3/uL (ref 0.1–1.0)
Monocytes Relative: 7.9 % (ref 3.0–12.0)
Neutro Abs: 4.3 10*3/uL (ref 1.4–7.7)
Neutrophils Relative %: 60.6 % (ref 43.0–77.0)
Platelets: 308 10*3/uL (ref 150.0–400.0)
RBC: 4.32 Mil/uL (ref 3.87–5.11)
RDW: 16 % — ABNORMAL HIGH (ref 11.5–15.5)
WBC: 7 10*3/uL (ref 4.0–10.5)

## 2022-07-25 LAB — BASIC METABOLIC PANEL
BUN: 41 mg/dL — ABNORMAL HIGH (ref 6–23)
CO2: 26 mEq/L (ref 19–32)
Calcium: 11 mg/dL — ABNORMAL HIGH (ref 8.4–10.5)
Chloride: 103 mEq/L (ref 96–112)
Creatinine, Ser: 2.71 mg/dL — ABNORMAL HIGH (ref 0.40–1.20)
GFR: 16.86 mL/min — ABNORMAL LOW (ref >60.00)
Glucose, Bld: 180 mg/dL — ABNORMAL HIGH (ref 70–99)
Potassium: 4.3 mEq/L (ref 3.5–5.1)
Sodium: 139 mEq/L (ref 135–145)

## 2022-07-25 MED ORDER — CETIRIZINE HCL 10 MG PO TABS
10.0000 mg | ORAL_TABLET | Freq: Every day | ORAL | 6 refills | Status: DC
Start: 2022-07-25 — End: 2023-01-25

## 2022-07-25 NOTE — Assessment & Plan Note (Signed)
Ongoing issue.  Pt reports this has been going on for years.  Recent ECHO was unremarkable and EKG was w/o acute findings.  No change in pain based on level of exertion- which makes angina less likely.  Reviewed red flags that should immediately send her to the ED and pt expressed understanding.  Will follow.

## 2022-07-25 NOTE — Patient Instructions (Signed)
Follow up as needed or as scheduled We'll notify you of your lab results and make any changes if needed START the Cetirizine (Zyrtec) daily for the drainage Keep the elbow clean and covered until there is a scab in place IF the chest pain changes, worsens, is more noticeable w/ activity, you get short of breath, sweaty, or nauseous- Please go to the ER for evaluation Make sure you are eating regularly- even if you don't feel like- to prevent your sugar from dropping Continue to drink LOTS of water If the monitor comes and you're confused, call cardiology or me and we'll figure it out together Call with any questions or concerns Hang in there!!!

## 2022-07-25 NOTE — Progress Notes (Signed)
   Subjective:    Patient ID: [name], female    DOB: 03-19-1951, 72 y.o.   MRN: [id number]  HPI Hospital f/u- pt blacked out while standing in line to checkout at [facility] on 4/10.  Had some preceding lightheadedness and vision went black.  She fell and hit her head.  On that day she had taken her insulin but skipped lunch.  EMS was called and gave her glucose tabs.  HR was in the 50s.  Head CT WNL, R elbow injury but negative for fx.  The working dx was multifactorial (dehydration, hypoglycemia, bradycardia) and she was d/c'd on 4/12.  D/C summary recommends repeat CBC/BMP.  Pt is supposed to receive her heart monitor in the mail and then f/u w/ Cardiology.  Pt reports she feels 'alright'.  Increased HA's after hitting her head.  Pt reports she feels 'a little light headed and off balance' at times.  Did have 1 symptomatic low since d/c- sugar was 57.  Pt knew to eat and sugar rebounded.  + CP.  Ongoing issue for pt over multiple years.  Occurs at rest and no change w/ exertion or activity.  'it feels like heartburn'.  She is on Famotidine and has known hiatal hernia  + PND- particularly w/ lying down.  Not taking any allergy medication.   Review of Systems For ROS see HPI     Objective:   Physical Exam Vitals reviewed.  Constitutional:      General: She is not in acute distress.    Appearance: Normal appearance. She is well-developed. She is not ill-appearing.  HENT:     Head: Normocephalic and atraumatic.  Eyes:     Conjunctiva/sclera: Conjunctivae normal.     Pupils: Pupils are equal, round, and reactive to light.  Neck:     Thyroid: No thyromegaly.  Cardiovascular:     Rate and Rhythm: Normal rate and regular rhythm.     Pulses: Normal pulses.     Heart sounds: Normal heart sounds. No murmur heard. Pulmonary:     Effort: Pulmonary effort is normal. No respiratory distress.     Breath sounds: Normal breath sounds.  Abdominal:     General: There is no distension.      Palpations: Abdomen is soft.     Tenderness: There is no abdominal tenderness.  Musculoskeletal:     Cervical back: Normal range of motion and neck supple.  Lymphadenopathy:     Cervical: No cervical adenopathy.  Skin:    General: Skin is warm and dry.     Comments: 0.5" skin tear on R elbow w/o drainage, erythema, or induration  Neurological:     Mental Status: She is alert and oriented to person, place, and time.  Psychiatric:        Behavior: Behavior normal.           Assessment & Plan:

## 2022-07-25 NOTE — Assessment & Plan Note (Signed)
New.  No clear cause for what precipitated episode on 4/10 so the working dx is multifactorial.  Discussed the importance of hydration, eating regularly- particularly when taking insulin, and compliance w/ heart monitor when it arrives.  She has apprehension regarding the monitor and I told her she can bring it here if she is concerned and we'll figure out how to put it on together.  It's a bit concerning that her HR is in the 50s at times, but she will wear the monitor and f/u w/ Cards regarding this.  Repeat CBC and BMP as recommended in d/c summary.  Will follow.

## 2022-07-25 NOTE — Assessment & Plan Note (Signed)
New.  Pt's sxs are consistent w/ allergy induced PND.  Start daily Cetirizine.  Pt expressed understanding and is in agreement w/ plan.

## 2022-07-26 ENCOUNTER — Encounter: Payer: Self-pay | Admitting: Nurse Practitioner

## 2022-07-26 ENCOUNTER — Telehealth: Payer: Self-pay

## 2022-07-26 ENCOUNTER — Ambulatory Visit: Payer: Medicare HMO | Admitting: Nurse Practitioner

## 2022-07-26 VITALS — BP 138/70 | HR 65 | Ht 64.0 in | Wt 179.2 lb

## 2022-07-26 DIAGNOSIS — I152 Hypertension secondary to endocrine disorders: Secondary | ICD-10-CM

## 2022-07-26 DIAGNOSIS — E042 Nontoxic multinodular goiter: Secondary | ICD-10-CM | POA: Diagnosis not present

## 2022-07-26 DIAGNOSIS — E1159 Type 2 diabetes mellitus with other circulatory complications: Secondary | ICD-10-CM

## 2022-07-26 DIAGNOSIS — E559 Vitamin D deficiency, unspecified: Secondary | ICD-10-CM | POA: Diagnosis not present

## 2022-07-26 DIAGNOSIS — Z789 Other specified health status: Secondary | ICD-10-CM

## 2022-07-26 DIAGNOSIS — I10 Essential (primary) hypertension: Secondary | ICD-10-CM

## 2022-07-26 DIAGNOSIS — E890 Postprocedural hypothyroidism: Secondary | ICD-10-CM | POA: Diagnosis not present

## 2022-07-26 DIAGNOSIS — E039 Hypothyroidism, unspecified: Secondary | ICD-10-CM

## 2022-07-26 MED ORDER — LEVOTHYROXINE SODIUM 100 MCG PO TABS: 100.0000 ug | ORAL_TABLET | Freq: Every day | ORAL | 3 refills | Status: DC

## 2022-07-26 MED ORDER — NOVOLIN 70/30 (70-30) 100 UNIT/ML ~~LOC~~ SUSP: 22.0000 [IU] | Freq: Two times a day (BID) | SUBCUTANEOUS | 3 refills | Status: DC

## 2022-07-26 NOTE — Telephone Encounter (Signed)
-----   Message from Sheliah Hatch, MD sent at 07/26/2022  7:26 AM EDT ----- Creatinine has again increased and GFR (kidney filtration) is significantly decreased.  Please make sure you are drinking plenty of water.  I am going to send this to Dr Malen Gauze to review and determine if any changes or appointments are needed.

## 2022-07-26 NOTE — Progress Notes (Signed)
07/26/2022, 12:17 PM   Endocrinology follow-up note   Subjective:    Patient ID: [name], female    DOB: 1951-04-03.  [name] is being seen in follow-up for management of currently uncontrolled,complicated symptomatic type 2 diabetes and associated hypertension, hypothyroidism. PMD :  Sheliah Hatch, MD.   Past Medical History:  Diagnosis Date   Anginal pain 03/19/2012   saw Dr. Melburn Popper .. she thinks its more related to stomach issues   Arthritis    "all over; mostly in my back" (03/20/2012)   Breast cancer    "right" (03/20/2012)   Cardiomyopathy    Chest pain 06/2005   Hospitalized, dystolic dysfunction   Chronic lower back pain    "have 2 herniated discs; going to have to have a fusion" (03/20/2012)   CKD (chronic kidney disease), stage III    Family history of anesthesia complication    "father has nausea and vomiting"   Gallstones    GERD (gastroesophageal reflux disease)    H/O hiatal hernia    History of kidney stones    History of right mastectomy    Hypertension    "patient states never had HTN, takes Hyzaar for heart   Hypothyroidism    Irritable bowel syndrome    Migraines    "it's been a long time since I've had one" (03/20/2012)   Osteoarthritis    Peripheral neuropathy    PONV (postoperative nausea and vomiting)    Sciatica    Type II diabetes mellitus    Past Surgical History:  Procedure Laterality Date   ABDOMINAL HYSTERECTOMY  1993   adenosin cardiolite  07/2005   (+) wall motion abnormality   AXILLARY LYMPH NODE DISSECTION  2003   squamous cell cancer   BACK SURGERY     CARDIAC CATHETERIZATION  ? 2005 / 2007   CHOLECYSTECTOMY N/A 01/24/2018   Procedure: LAPAROSCOPIC CHOLECYSTECTOMY;  Surgeon: Harriette Bouillon, MD;  Location: [facility] OR;  Service: General;  Laterality: N/A;   CT abdomen and pelvis  02/2010   Same   ESOPHAGOGASTRODUODENOSCOPY  2005    GERD   KNEE ARTHROSCOPY  1990's   "right" (03/20/2012)   LUMBAR FUSION N/A 08/22/2014   Procedure: Right L2-3 and right L1-2 transforaminal lumbar interbody fusion with pedicle screws, rods, sleeves, cages, local bone graft, Vivigen, cancellous chips;  Surgeon: Kerrin Champagne, MD;  Location: [facility] OR;  Service: Orthopedics;  Laterality: N/A;   LUMBAR LAMINECTOMY/DECOMPRESSION MICRODISCECTOMY N/A 02/04/2014   Procedure: RIGHT L2-3 MICRODISCECTOMY ;  Surgeon: Kerrin Champagne, MD;  Location: [facility] OR;  Service: Orthopedics;  Laterality: N/A;   MASTECTOMY Right    MASTECTOMY WITH AXILLARY LYMPH NODE DISSECTION  12/2003   "right" (03/20/2012)   POSTERIOR CERVICAL FUSION/FORAMINOTOMY  2001   SHOULDER ARTHROSCOPY W/ ROTATOR CUFF REPAIR  2003   Left   THYROIDECTOMY  1977   Right   TOTAL KNEE ARTHROPLASTY  2000   Right   [place] of abdomen  07/2005   Fatty liver / kidney stones   Social History   Socioeconomic History   Marital status: Married    Spouse name: Not on file  Number of children: 1   Years of education: Not on file   Highest education level: Not on file  Occupational History   Occupation: Retired since 2003  Tobacco Use   Smoking status: Never   Smokeless tobacco: Never  Vaping Use   Vaping Use: Never used  Substance and Sexual Activity   Alcohol use: No   Drug use: No   Sexual activity: Not on file  Other Topics Concern   Not on file  Social History Narrative   Not on file   Social Determinants of Health   Financial Resource Strain: Medium Risk (12/23/2021)   Overall Financial Resource Strain (CARDIA)    Difficulty of Paying Living Expenses: Somewhat hard  Food Insecurity: No Food Insecurity (07/21/2022)   Hunger Vital Sign    Worried About Running Out of Food in the Last Year: Never true    Ran Out of Food in the Last Year: Never true  Transportation Needs: No Transportation Needs (07/21/2022)   PRAPARE - Administrator, Civil Service (Medical): No    Lack of  Transportation (Non-Medical): No  Physical Activity: Insufficiently Active (12/23/2021)   Exercise Vital Sign    Days of Exercise per Week: 3 days    Minutes of Exercise per Session: 30 min  Stress: No Stress Concern Present (12/23/2021)   [employer] of Occupational Health - Occupational Stress Questionnaire    Feeling of Stress : Not at all  Social Connections: Moderately Integrated (12/23/2021)   Social Connection and Isolation Panel [NHANES]    Frequency of Communication with Friends and Family: More than three times a week    Frequency of Social Gatherings with Friends and Family: More than three times a week    Attends Religious Services: More than 4 times per year    Active Member of [employer] or Organizations: No    Attends Banker Meetings: Never    Marital Status: Married   Outpatient Encounter Medications as of 07/26/2022  Medication Sig   acetaminophen (TYLENOL) 500 MG tablet Take 500-1,000 mg by mouth every 6 (six) hours as needed for moderate pain, headache or mild pain.   Alcohol Swabs (B-D SINGLE USE SWABS REGULAR) PADS Use as directed.   amLODipine (NORVASC) 10 MG tablet Take 1 tablet (10 mg total) by mouth daily.   aspirin 81 MG tablet Take 81 mg by mouth daily.   Blood Glucose Monitoring Suppl (TRUE METRIX AIR GLUCOSE METER) w/Device KIT 1 kit by Does not apply route 3 (three) times daily.   cetirizine (ZYRTEC) 10 MG tablet Take 1 tablet (10 mg total) by mouth daily.   Cholecalciferol (VITAMIN D) 50 MCG (2000 UT) tablet Take 2,000 Units by mouth daily.   dapagliflozin propanediol (FARXIGA) 10 MG TABS tablet Take by mouth daily.   DULoxetine (CYMBALTA) 20 MG capsule Take 20 mg by mouth daily.   famotidine (PEPCID) 40 MG tablet TAKE 1 TABLET BY MOUTH EVERY DAY (Patient taking differently: Take 40 mg by mouth daily.)   glipiZIDE (GLUCOTROL XL) 5 MG 24 hr tablet Take 1 tablet by mouth daily.   glucose blood (TRUE METRIX BLOOD GLUCOSE TEST) test strip Use as  instructed   hydrALAZINE (APRESOLINE) 50 MG tablet TAKE 1 TABLET BY MOUTH TWICE A DAY   losartan (COZAAR) 25 MG tablet Take 1 tablet (25 mg total) by mouth daily.   metoCLOPramide (REGLAN) 10 MG tablet TAKE 1 TABLET FOUR TIMES DAILY (Patient taking differently: Take 10 mg by mouth in  the morning, at noon, in the evening, and at bedtime.)   pantoprazole (PROTONIX) 40 MG tablet Take 1 tablet (40 mg total) by mouth 2 (two) times daily.   Polyethyl Glycol-Propyl Glycol (SYSTANE ULTRA) 0.4-0.3 % SOLN Apply 1-2 drops to eye daily as needed (dry eyes).   polyethylene glycol (MIRALAX / GLYCOLAX) packet Take 17 g by mouth daily as needed for moderate constipation (MIX AND DRINK).   rosuvastatin (CRESTOR) 20 MG tablet TAKE 1 TABLET AT BEDTIME (Patient taking differently: Take 20 mg by mouth daily.)   sodium bicarbonate 650 MG tablet Take 650 mg by mouth 2 (two) times daily.   SUMAtriptan (IMITREX) 50 MG tablet TAKE 1 TAB EVERY 2 HRS AS NEEDED FOR MIGRAINE. MAY REPEAT IN 2 HOURS IF HEADACHE PERSISTS OR RECURS. (Patient taking differently: Take 50 mg by mouth every 2 (two) hours as needed for migraine (May repeat in 2 hours if headache persists or recurs.).)   tizanidine (ZANAFLEX) 2 MG capsule Take 1 capsule (2 mg total) by mouth 3 (three) times daily as needed for muscle spasms.   torsemide (DEMADEX) 20 MG tablet Take 1 tablet (20 mg total) by mouth 2 (two) times daily.   TRUEplus Lancets 33G MISC Use as directed   Vitamin D, Ergocalciferol, (DRISDOL) 1.25 MG (50000 UNIT) CAPS capsule Take 1 capsule (50,000 Units total) by mouth every 7 (seven) days.   [DISCONTINUED] insulin NPH-regular Human (NOVOLIN 70/30) (70-30) 100 UNIT/ML injection Inject 30 Units into the skin 2 (two) times daily with a meal.   [DISCONTINUED] levothyroxine (SYNTHROID) 100 MCG tablet Take 1 tablet (100 mcg total) by mouth daily.   insulin NPH-regular Human (NOVOLIN 70/30) (70-30) 100 UNIT/ML injection Inject 22 Units into the skin 2  (two) times daily with a meal.   levothyroxine (SYNTHROID) 100 MCG tablet Take 1 tablet (100 mcg total) by mouth daily.   No facility-administered encounter medications on file as of 07/26/2022.    ALLERGIES: Allergies  Allergen Reactions   Dilaudid [Hydromorphone Hcl] Nausea And Vomiting   Hydromorphone Nausea And Vomiting   Lipitor [Atorvastatin] Other (See Comments)    Body & Muscle Aches   Adhesive [Tape] Rash   Ampicillin Rash and Other (See Comments)    Also developed welts on her hands when she took it in the hospital   Latex Itching, Dermatitis, Rash and Other (See Comments)    Patient had a reaction to tape after surgery and they told her she was allergic to Latex    Penicillins Rash and Other (See Comments)    Has patient had a PCN reaction causing immediate rash, facial/tongue/throat swelling, SOB or lightheadedness with hypotension: No Has patient had a PCN reaction causing severe rash involving mucus membranes or skin necrosis: No Has patient had a PCN reaction that required hospitalization: Yes - in hospital receiving treatment Has patient had a PCN reaction occurring within the last 10 years: No If all of the above answers are "NO", then may proceed with Cephalosporin use.     VACCINATION STATUS: Immunization History  Administered Date(s) Administered   Fluad Quad(high Dose 65+) 12/13/2019, 02/01/2021, 01/06/2022   Influenza Split 01/31/2011   Influenza Whole 02/23/2007   Influenza, High Dose Seasonal PF 01/10/2017   Influenza,inj,Quad PF,6+ Mos 01/07/2014, 02/09/2016, 01/01/2018, 12/03/2018   Influenza-Unspecified 01/10/2012   PFIZER Comirnaty(Gray Top)Covid-19 Tri-Sucrose Vaccine 05/24/2019, 06/18/2019, 01/11/2020, 08/15/2020   PFIZER(Purple Top)SARS-COV-2 Vaccination 05/24/2019, 06/18/2019, 01/11/2020   Pneumococcal Conjugate-13 05/16/2019   Pneumococcal Polysaccharide-23 08/25/2014   Tdap 04/20/2011, 07/20/2022  Diabetes She presents for her follow-up  diabetic visit. She has type 2 diabetes mellitus. Onset time: She was diagnosed at approximate age of 38 years. Her disease course has been improving. Hypoglycemia symptoms include nervousness/anxiousness and sweats. Pertinent negatives for hypoglycemia include no confusion, headaches, pallor, seizures or tremors. Associated symptoms include fatigue, polydipsia and polyuria. Pertinent negatives for diabetes include no blurred vision, no chest pain, no foot ulcerations, no polyphagia and no weight loss. Pertinent negatives for hypoglycemia complications include no nocturnal hypoglycemia. Symptoms are improving. Diabetic complications include heart disease, nephropathy and retinopathy. Risk factors for coronary artery disease include diabetes mellitus, hypertension, obesity, sedentary lifestyle, post-menopausal and dyslipidemia. Current diabetic treatment includes insulin injections and oral agent (dual therapy) (was started on Farxiga between visits). She is compliant with treatment most of the time. Her weight is decreasing steadily. She is following a generally unhealthy diet. When asked about meal planning, she reported none. She has not had a previous visit with a dietitian. She never participates in exercise. Her home blood glucose trend is decreasing steadily. Her overall blood glucose range is 140-180 mg/dl. (She presents today with her meter and logs showing slightly above target glycemic profile overall.  She also has some significant hypoglycemia and resultant hyperglycemia from over-correction.  Her most recent A1c on 3/5 was 8.4%, increasing from last visit of 8.3%.  Analysis of her meter shows 7-day average of 167, 14-day average of 158, 30-day average of 154.) An ACE inhibitor/angiotensin II receptor blocker is being taken. She does not see a podiatrist.Eye exam is current.  Hypertension This is a chronic problem. The current episode started more than 1 year ago. The problem has been gradually  improving since onset. The problem is controlled. Associated symptoms include sweats. Pertinent negatives include no blurred vision, chest pain, headaches, palpitations or shortness of breath. Agents associated with hypertension include thyroid hormones. Risk factors for coronary artery disease include diabetes mellitus, obesity, sedentary lifestyle, dyslipidemia, family history and post-menopausal state. Past treatments include angiotensin blockers, calcium channel blockers, beta blockers, diuretics and direct vasodilators. The current treatment provides mild improvement. There are no compliance problems.  Hypertensive end-organ damage includes kidney disease, CAD/MI and retinopathy. Identifiable causes of hypertension include chronic renal disease, hyperparathyroidism and a thyroid problem. undergoing workup for hyperparathyroidism.  Thyroid Problem Presents for follow-up visit. Onset time: She underwent what appears to be total thyroidectomy in her 30s for reportedly benign goiter.  Has taken thyroid hormone ever since, currently levothyroxine 100 mcg p.o. nightly. Symptoms include anxiety, depressed mood and fatigue. Patient reports no cold intolerance, diarrhea, heat intolerance, palpitations, tremors, weight gain or weight loss. The symptoms have been stable. Past treatments include levothyroxine. Prior procedures include thyroidectomy.     Review of systems  Constitutional: + stable body weight,  current Body mass index is 30.76 kg/m. , no fatigue, no subjective hyperthermia, no subjective hypothermia Eyes: no blurry vision, no xerophthalmia ENT: no sore throat, no nodules palpated in throat, no dysphagia/odynophagia, no hoarseness Cardiovascular: no chest pain, no shortness of breath, no palpitations, + leg swelling Respiratory: no cough, no shortness of breath Gastrointestinal: no nausea/vomiting/diarrhea Musculoskeletal: c/o chronic back pain- has spinal stimulator placed Skin: no rashes,  no hyperemia Neurological: no tremors, no numbness, no tingling, no dizziness Psychiatric: no depression, no anxiety    Objective:    BP 138/70 (BP Location: Left Arm, Patient Position: Sitting, Cuff Size: Large)   Pulse 65   Ht 5\' 4"  (1.626 m)   Wt 179 lb  3.2 oz (81.3 kg)   BMI 30.76 kg/m   Wt Readings from Last 3 Encounters:  07/26/22 179 lb 3.2 oz (81.3 kg)  07/25/22 179 lb 4 oz (81.3 kg)  07/22/22 175 lb 11.3 oz (79.7 kg)    BP Readings from Last 3 Encounters:  07/26/22 138/70  07/25/22 136/62  07/22/22 (!) 127/55    Physical Exam- Limited  Constitutional:  Body mass index is 30.76 kg/m. , not in acute distress, normal state of mind Eyes:  EOMI, no exophthalmos Musculoskeletal: no gross deformities, strength intact in all four extremities, no gross restriction of joint movements Skin:  no rashes, no hyperemia Neurological: no tremor with outstretched hands       Latest Ref Rng & Units 07/25/2022   11:44 AM 07/22/2022    1:14 AM 07/20/2022    8:34 PM  CMP  Glucose 70 - 99 mg/dL 098  119  81   BUN 6 - 23 mg/dL 41  22  22   Creatinine 0.40 - 1.20 mg/dL 1.47  8.29  5.62   Sodium 135 - 145 mEq/L 139  140  140   Potassium 3.5 - 5.1 mEq/L 4.3  4.2  4.4   Chloride 96 - 112 mEq/L 103  106  106   CO2 19 - 32 mEq/L 26  25  24    Calcium 8.4 - 10.5 mg/dL 13.0  86.5  78.4      Diabetic Labs (most recent): Lab Results  Component Value Date   HGBA1C 8.4 (H) 06/14/2022   HGBA1C 8.3 (H) 03/17/2022   HGBA1C 9.9 (A) 10/28/2020   MICROALBUR 570.4 08/05/2019   MICROALBUR 188.7 06/01/2018   MICROALBUR 38.3 06/30/2016     Lipid Panel ( most recent) Lipid Panel     Component Value Date/Time   CHOL 133 06/14/2022 1122   CHOL 162 02/05/2021 0947   TRIG 225.0 (H) 06/14/2022 1122   HDL 41.50 06/14/2022 1122   HDL 51 02/05/2021 0947   CHOLHDL 3 06/14/2022 1122   VLDL 45.0 (H) 06/14/2022 1122   LDLCALC 76 02/05/2021 0947   LDLCALC 60 06/02/2020 0816   LDLDIRECT 58.0  06/14/2022 1122      Assessment & Plan:   1) DM type 2 causing vascular disease (HCC)  - [name] has currently uncontrolled symptomatic type 2 DM since 72 years of age.  She presents today with her meter and logs showing slightly above target glycemic profile overall.  She also has some significant hypoglycemia and resultant hyperglycemia from over-correction.  Her most recent A1c on 3/5 was 8.4%, increasing from last visit of 8.3%.  Analysis of her meter shows 7-day average of 167, 14-day average of 158, 30-day average of 154.  -her diabetes is complicated by retinopathy, obesity/sedentary life, and she remains at a high risk for more acute and chronic complications which include CAD, CVA, CKD, retinopathy, and neuropathy. These are all discussed in detail with her.  The following Lifestyle Medicine recommendations according to American College of Lifestyle Medicine [facility]) were discussed and offered to patient and she agrees to start the journey:  A. Whole Foods, Plant-based plate comprising of fruits and vegetables, plant-based proteins, whole-grain carbohydrates was discussed in detail with the patient.   A list for source of those nutrients were also provided to the patient.  Patient will use only water or unsweetened tea for hydration. B.  The need to stay away from risky substances including alcohol, smoking; obtaining 7 to 9 hours of restorative  sleep, at least 150 minutes of moderate intensity exercise weekly, the importance of healthy social connections,  and stress reduction techniques were discussed. C.  A full color page of  Calorie density of various food groups per pound showing examples of each food groups was provided to the patient.  - Nutritional counseling repeated at each appointment due to patients tendency to fall back in to old habits.  - The patient admits there is a room for improvement in their diet and drink choices. -  Suggestion is made for the patient to  avoid simple carbohydrates from their diet including Cakes, Sweet Desserts / Pastries, Ice Cream, Soda (diet and regular), Sweet Tea, Candies, Chips, Cookies, Sweet Pastries, Store Bought Juices, Alcohol in Excess of 1-2 drinks a day, Artificial Sweeteners, Coffee Creamer, and "Sugar-free" Products. This will help patient to have stable blood glucose profile and potentially avoid unintended weight gain.   - I encouraged the patient to switch to unprocessed or minimally processed complex starch and increased protein intake (animal or plant source), fruits, and vegetables.   - Patient is advised to stick to a routine mealtimes to eat 3 meals a day and avoid unnecessary snacks (to snack only to correct hypoglycemia).  - she is following with Norm Salt, RDN, CDE for individualized diabetes education.  - I have approached her with the following individualized plan to manage diabetes and patient agrees:   -She will continue to benefit from her current simplified insulin treatment regimen.    -She is encouraged to stay engaged for proper monitoring and safe use of insulin (which she has struggled with in the past).  -Based on her current glycemic profile, she will tolerate decrease in her Novolin 70/30 to 22 units SQ BID with meals if glucose is above 90 and she is eating.  She can continue her Farxiga 10 mg po daily as prescribed by nephrology, and is encouraged to stay off Glipizide.  -She is encouraged to start monitoring blood glucose consistently 4 times per day, before meals and before bed and to call the clinic if she has readings less than 70 or greater than 200 for 3 tests in a row.  -She could greatly benefit from CGM.  She never heard about the ones prescribed previously.  Will send in Rx for Libre 3 to [facility].  - she is warned not to take insulin without proper monitoring per orders.  -She is not a candidate for Metformin due to CKD.    - Patient specific target  A1c;  LDL,  HDL, Triglycerides, were discussed in detail.  2) BP/HTN:  Her blood pressure is controlled to target.  She says she is in quite a bit of pain today.  She is advised to continue Amlodipine 10 mg po daily, Carvedilol 6.25 mg po twice daily, Lasix 40 mg po daily as needed for swelling, Hydralazine 25 mg po TID, and Losartan 125 mg po daily. Will defer any med changes to nephrologist (who she is seeing later this month).  3) Lipids/HPL:    Her most recent lipid panel from 06/14/22 shows controlled LDL of 58 and elevated triglycerides of 225.  She is advised to continue Crestor 20 mg po daily.    4)  Weight/Diet:  Her Body mass index is 30.76 kg/m.- clearly complicating her diabetes care.  She is a candidate for modest weight loss.  I discussed with her the fact that loss of 5 - 10% of her  current body weight will have  the most impact on her diabetes management.  CDE Consult will be initiated . Exercise, and detailed carbohydrates information provided  -  detailed on discharge instructions.  5) Hypothyroidism-postsurgical Her previsit thyroid function tests are consistent with appropriate hormone replacement.  She is advised to continue Levothyroxine 100 mcg po daily before breakfast.     - We discussed about the correct intake of her thyroid hormone, on empty stomach at fasting, with water, separated by at least 30 minutes from breakfast and other medications,  and separated by more than 4 hours from calcium, iron, multivitamins, acid reflux medications (PPIs). -Patient is made aware of the fact that thyroid hormone replacement is needed for life, dose to be adjusted by periodic monitoring of thyroid function tests.  6) Multinodular goiter. -Her most recent thyroid/neck ultrasound from 09/12/18 revealed 2.9 cm nodule on the left lobe of her thyroid -She has surgically absent right thyroid lobe.  Biopsy of 2.9 cm nodule on the left lobe is benign.    She will not need any surgical intervention at this  time.    7) Vitamin D deficiency -Her most recent vitamin D level on 06/02/20 was 15.  She was taken off her vitamin D supplement by her nephrologist. Will recommend she follow up with her nephrologist regarding low vitamin D levels.  8) Hypercalcemia / Hyperparathyroidism  Patient has had several instances of elevated calcium, with the highest level being at 11.4 mg/dL. A corresponding intact PTH level was also high, at 85. - Patient also has vitamin D deficiency,  with the last level being 15. - No apparent complications from hypercalcemia/hyperparathyroidism: no history of  nephrolithiasis, + osteoporosis, no fragility fractures. No abdominal pain, no major mood disorders, no bone pain.   - I discussed with the patient about the physiology of calcium and parathyroid hormone, and possible  effects of  increased PTH/ Calcium , including kidney stones, cardiac dysrhythmias, osteoporosis, abdominal pain, etc.   Based on her additional testing including repeat blood work and 24-hr urine collection, it appears she may have mild secondary hyperparathyroidism due to CKD.  Her repeat Ca and PTH levels have improved without intervention on recent labs. Her PTH-rp levels did not suggest malignancy, her 24-hr urine did not suggest FHH.    9) Chronic Care/Health Maintenance: -she is on ARB and is encouraged to initiate and continue to follow up with Ophthalmology, Dentist,  Podiatrist at least yearly or according to recommendations, and advised to  stay away from smoking. I have recommended yearly flu vaccine and pneumonia vaccine at least every 5 years; moderate intensity exercise for up to 150 minutes weekly; and  sleep for at least 7 hours a day.  - I advised patient to maintain close follow up with Sheliah Hatch, MD for primary care needs.     I spent  50  minutes in the care of the patient today including review of labs from CMP, Lipids, Thyroid Function, Hematology (current and previous  including abstractions from other facilities); face-to-face time discussing  her blood glucose readings/logs, discussing hypoglycemia and hyperglycemia episodes and symptoms, medications doses, her options of short and long term treatment based on the latest standards of care / guidelines;  discussion about incorporating lifestyle medicine;  and documenting the encounter. Risk reduction counseling performed per USPSTF guidelines to reduce obesity and cardiovascular risk factors.     Please refer to Patient Instructions for Blood Glucose Monitoring and Insulin/Medications Dosing Guide"  in media tab for additional information. Please  also refer to " Patient Self Inventory" in the Media  tab for reviewed elements of pertinent patient history.  Ulyess Mort participated in the discussions, expressed understanding, and voiced agreement with the above plans.  All questions were answered to her satisfaction. she is encouraged to contact clinic should she have any questions or concerns prior to her return visit.   Follow up plan: - No follow-ups on file.  Ronny Bacon, [facility] [address] [place], [place] [address] Phone: [phone] Fax: [phone]  07/26/2022, 12:17 PM

## 2022-07-26 NOTE — Telephone Encounter (Signed)
Pt aware of lab results 

## 2022-07-28 DIAGNOSIS — R55 Syncope and collapse: Secondary | ICD-10-CM

## 2022-08-02 DIAGNOSIS — E1165 Type 2 diabetes mellitus with hyperglycemia: Secondary | ICD-10-CM | POA: Diagnosis not present

## 2022-08-08 ENCOUNTER — Telehealth: Payer: Self-pay | Admitting: *Deleted

## 2022-08-08 NOTE — Telephone Encounter (Signed)
Patient left a message asking if she could come to the office to be shown how to use the Freestyle [place] CGM? I have tried to call the patient, no answer and there was no voicemail.  Please let the patient know that she may have a nurse appointment Tuesday - Thursday and before 11 am.

## 2022-08-09 NOTE — Telephone Encounter (Signed)
This was sent to the front office to arrange and contact the patient.

## 2022-08-10 ENCOUNTER — Ambulatory Visit (INDEPENDENT_AMBULATORY_CARE_PROVIDER_SITE_OTHER): Payer: Medicare HMO

## 2022-08-10 VITALS — BP 134/76 | Ht 64.0 in | Wt 176.0 lb

## 2022-08-10 DIAGNOSIS — E1159 Type 2 diabetes mellitus with other circulatory complications: Secondary | ICD-10-CM

## 2022-08-10 NOTE — Progress Notes (Signed)
Pt brought in a new Freestyle Libre 3 today. Went over usage and application instructions. Pt had a family member with her that will be helping her with replacing the sensors every 14 days. Sensor was placed on patients L arm. Pt and family member voiced understanding on instructions.

## 2022-08-16 ENCOUNTER — Other Ambulatory Visit: Payer: Self-pay | Admitting: Family Medicine

## 2022-08-16 NOTE — Telephone Encounter (Signed)
I was looking thru her last note and the previous it does not say to stop her Coreg . Did we stop it or Cardiology ?

## 2022-08-17 ENCOUNTER — Other Ambulatory Visit: Payer: Self-pay | Admitting: Family

## 2022-08-17 ENCOUNTER — Other Ambulatory Visit: Payer: Self-pay | Admitting: Family Medicine

## 2022-08-17 NOTE — Telephone Encounter (Signed)
Advised pt that she will need to reach out to Cardiology to have this med refilled

## 2022-08-24 ENCOUNTER — Telehealth: Payer: Self-pay | Admitting: *Deleted

## 2022-08-24 ENCOUNTER — Ambulatory Visit: Payer: Self-pay | Admitting: General Surgery

## 2022-08-24 DIAGNOSIS — E210 Primary hyperparathyroidism: Secondary | ICD-10-CM | POA: Diagnosis not present

## 2022-08-24 NOTE — H&P (Signed)
Chief Complaint: New Consultation ( Right thyroid lobectomy)   History of Present Illness: [name] is a 72 y.o. female who is seen today as an office consultation for evaluation of New Consultation ( Right thyroid lobectomy)   Patient is a 72 year old female who comes in secondary to elevated PTH and calcium labs. Patient states that she was found to have elevated calciums while visiting with her kidney doctor.  Patient has stage IV end-stage renal disease.  She states that she is continues to make urine.  She is currently not on dialysis.  Patient underwent PTH which revealed an elevated PTH of 110.  Patient subsequently underwent sestamibi scan which revealed a left inferior parathyroid adenoma.  Patient was referred for further evaluation and management.  Patient denies any recurrent kidney stones, fractures, cramps or GI malfunction.    Review of Systems: A complete review of systems was obtained from the patient.  I have reviewed this information and discussed as appropriate with the patient.  See HPI as well for other ROS.  Review of Systems  Constitutional:  Negative for fever.  HENT:  Negative for congestion.   Eyes:  Negative for blurred vision.  Respiratory:  Negative for cough, shortness of breath and wheezing.   Cardiovascular:  Negative for chest pain and palpitations.  Gastrointestinal:  Negative for heartburn.  Genitourinary:  Negative for dysuria.  Musculoskeletal:  Negative for myalgias.  Skin:  Negative for rash.  Neurological:  Negative for dizziness and headaches.  Psychiatric/Behavioral:  Negative for depression and suicidal ideas.   All other systems reviewed and are negative.    Medical History: Past Medical History: Diagnosis Date  Arthritis   Chronic kidney disease   GERD (gastroesophageal reflux disease)   History of cancer   Thyroid disease    There is no problem list on file for this patient.   Past Surgical  History: Procedure Laterality Date  Back Surgery    Bladder Surgery    HYSTERECTOMY    Knee Surgery    Thyroid Surgery      Allergies Allergen Reactions  Penicillin Anaphylaxis and Rash  Adhesive Rash  Ampicillin Rash and Other (See Comments)   Developed welts on her hands   Dilaudid [Hydromorphone] Nausea and Vomiting  Latex Rash and Other (See Comments)   Dermatitis  Lipitor [Atorvastatin] Other (See Comments)   Body and muscle aches   Current Outpatient Medications on File Prior to Visit Medication Sig Dispense Refill  cholecalciferol (VITAMIN D3) 2,000 unit tablet Take 2,000 Units by mouth once daily    dapagliflozin propanediol (FARXIGA) 10 mg tablet Take by mouth once daily    insulin NPH-REGULAR (NOVOLIN 70/30 U-100 INSULIN) 100 unit/mL (70-30) injection INJECT 45 UNITS INTO THE SKIN 2 TIMES DAILY WITH A MEAL    levothyroxine (SYNTHROID) 100 MCG tablet Take 100 mcg by mouth every morning before breakfast (0630)    No current facility-administered medications on file prior to visit.   Family History Problem Relation Age of Onset  Diabetes Mother   Breast cancer Mother   Obesity Brother   Diabetes Brother     Social History  Tobacco Use Smoking Status Never Smokeless Tobacco Never    Social History  Socioeconomic History  Marital status: Married Tobacco Use  Smoking status: Never  Smokeless tobacco: Never Vaping Use  Vaping status: Never Used Substance and Sexual Activity  Alcohol use: Never  Drug use: Never  Social Determinants of Health  Financial Resource Strain: Medium Risk (12/23/2021)  Received  from [facility]  Overall Financial Resource Strain (CARDIA)   Difficulty of Paying Living Expenses: Somewhat hard Food Insecurity: No Food Insecurity (07/21/2022)  Received from [facility]  Hunger Vital Sign   Worried About Running Out of Food in the Last Year: Never true   Ran Out of Food in the Last Year: Never true Transportation Needs: No  Transportation Needs (07/21/2022)  Received from [facility] - Transportation   Lack of Transportation (Medical): No   Lack of Transportation (Non-Medical): No Physical Activity: Insufficiently Active (12/23/2021)  Received from [facility]  Exercise Vital Sign   Days of Exercise per Week: 3 days   Minutes of Exercise per Session: 30 min Stress: No Stress Concern Present (12/23/2021)  Received from [facility] - Occupational Stress Questionnaire   Feeling of Stress : Not at all Social Connections: Moderately Integrated (12/23/2021)  Received from [facility]  Social Connection and Isolation Panel [NHANES]   Frequency of Communication with Friends and Family: More than three times a week   Frequency of Social Gatherings with Friends and Family: More than three times a week   Attends Religious Services: More than 4 times per year   Active Member of [employer] or Organizations: No   Attends Banker Meetings: Never   Marital Status: Married   Objective:  Vitals:  08/24/22 1125 BP: 129/84 Pulse: 74 Temp: 36.5 C (97.7 F) SpO2: 97% Weight: 82.6 kg (182 lb) Height: 162.6 cm (5\' 4" ) PainSc:   6   Body mass index is 31.24 kg/m.  Physical Exam Constitutional:      Appearance: Normal appearance.  HENT:     Head: Normocephalic and atraumatic.     Mouth/Throat:     Mouth: Mucous membranes are moist.     Pharynx: Oropharynx is clear.  Eyes:     General: No scleral icterus.    Pupils: Pupils are equal, round, and reactive to light.  Cardiovascular:     Rate and Rhythm: Normal rate and regular rhythm.     Pulses: Normal pulses.     Heart sounds: No murmur heard.    No friction rub. No gallop.  Pulmonary:     Effort: Pulmonary effort is normal. No respiratory distress.     Breath sounds: Normal breath sounds. No stridor.  Abdominal:     General: Abdomen is flat.  Musculoskeletal:        General: No swelling.   Skin:    General: Skin is warm.  Neurological:     General: No focal deficit present.     Mental Status: She is alert and oriented to person, place, and time. Mental status is at baseline.  Psychiatric:        Mood and Affect: Mood normal.        Thought Content: Thought content normal.        Judgment: Judgment normal.     Labs, Imaging and Diagnostic Testing: Sestamibi scan and laboratory studies were reviewed per medical record.  Assessment and Plan:    Diagnoses and all orders for this visit:  Primary hyperparathyroidism (CMS/HHS-HCC)    1.  At this point patient be accounted for a left inferior parathyroidectomy secondary to primary hyperparathyroidism. Will have the patient evaluated by cardiology secondary to history of syncope.  She has a follow-up appoint with him on May 24.  Once patient is cleared from surgery we will proceed to the operating room for a left inferior  middle invasive parathyroidectomy.  2.  Discussed with patient the risk benefits of procedure to include elliptical infection, bleeding, damage surrounding structures, possible need for further surgery.  Patient voiced understanding wished to proceed.    Axel Filler, MD

## 2022-08-24 NOTE — Telephone Encounter (Signed)
   Pre-operative Risk Assessment    Patient Name: [name]  DOB: 1950/08/18 MRN: [id number]      Request for Surgical Clearance    Procedure:   Thyroid Surgery  Date of Surgery:  Clearance TBD                                 Surgeon:  Dr. Axel Filler Surgeon's Group or Practice Name:  [facility] Phone number:  [phone] Fax number:  [phone]   Type of Clearance Requested:   - Medical  - Pharmacy:  Hold Aspirin 5 days prior   Type of Anesthesia:  General    Additional requests/questions:  Patient has upcoming appointment with Fannie Knee  on 09/02/22  Signed, Emmit Pomfret   08/24/2022, 3:11 PM

## 2022-08-25 DIAGNOSIS — E1122 Type 2 diabetes mellitus with diabetic chronic kidney disease: Secondary | ICD-10-CM | POA: Diagnosis not present

## 2022-08-25 DIAGNOSIS — N184 Chronic kidney disease, stage 4 (severe): Secondary | ICD-10-CM | POA: Diagnosis not present

## 2022-08-25 DIAGNOSIS — I130 Hypertensive heart and chronic kidney disease with heart failure and stage 1 through stage 4 chronic kidney disease, or unspecified chronic kidney disease: Secondary | ICD-10-CM | POA: Diagnosis not present

## 2022-08-25 DIAGNOSIS — I272 Pulmonary hypertension, unspecified: Secondary | ICD-10-CM | POA: Diagnosis not present

## 2022-08-25 DIAGNOSIS — R809 Proteinuria, unspecified: Secondary | ICD-10-CM | POA: Diagnosis not present

## 2022-08-25 DIAGNOSIS — R768 Other specified abnormal immunological findings in serum: Secondary | ICD-10-CM | POA: Diagnosis not present

## 2022-08-25 DIAGNOSIS — I5032 Chronic diastolic (congestive) heart failure: Secondary | ICD-10-CM | POA: Diagnosis not present

## 2022-08-25 DIAGNOSIS — R803 Bence Jones proteinuria: Secondary | ICD-10-CM | POA: Diagnosis not present

## 2022-08-25 NOTE — Telephone Encounter (Signed)
Primary Cardiologist:Will Jorja Loa, MD  Chart reviewed as part of pre-operative protocol coverage. Because of [name]'s past medical history and time since last visit, he/she will require a follow-up visit in order to better assess preoperative cardiovascular risk.  Pre-op covering staff: -Patient has an appointment on 09/02/2022 with Azalee Course, PA at which time clearance can be addressed. Appointment notes have been updated.  - Please contact requesting surgeon's office via preferred method (i.e, phone, fax) to inform them of need for appointment prior to surgery.  [name], NP-C  08/25/2022, 10:32 AM [address] Office [phone] Fax [phone]

## 2022-08-25 NOTE — Telephone Encounter (Addendum)
Clearance will be addressed at next office visit on 09/02/22 with Azalee Course, PA-C.

## 2022-08-26 LAB — LAB REPORT - SCANNED: EGFR: 30

## 2022-08-30 ENCOUNTER — Ambulatory Visit: Payer: Medicare HMO | Attending: Cardiology

## 2022-08-30 DIAGNOSIS — R55 Syncope and collapse: Secondary | ICD-10-CM

## 2022-08-31 ENCOUNTER — Encounter: Payer: Self-pay | Admitting: *Deleted

## 2022-08-31 ENCOUNTER — Encounter: Payer: Self-pay | Admitting: Nephrology

## 2022-09-02 ENCOUNTER — Encounter: Payer: Self-pay | Admitting: Physician Assistant

## 2022-09-02 ENCOUNTER — Ambulatory Visit: Payer: Medicare HMO | Attending: Physician Assistant | Admitting: Physician Assistant

## 2022-09-02 VITALS — BP 128/68 | HR 46 | Ht 64.0 in | Wt 186.2 lb

## 2022-09-02 DIAGNOSIS — R001 Bradycardia, unspecified: Secondary | ICD-10-CM | POA: Diagnosis not present

## 2022-09-02 DIAGNOSIS — Z794 Long term (current) use of insulin: Secondary | ICD-10-CM

## 2022-09-02 DIAGNOSIS — Z01818 Encounter for other preprocedural examination: Secondary | ICD-10-CM

## 2022-09-02 DIAGNOSIS — E119 Type 2 diabetes mellitus without complications: Secondary | ICD-10-CM | POA: Diagnosis not present

## 2022-09-02 DIAGNOSIS — N189 Chronic kidney disease, unspecified: Secondary | ICD-10-CM | POA: Diagnosis not present

## 2022-09-02 DIAGNOSIS — R079 Chest pain, unspecified: Secondary | ICD-10-CM

## 2022-09-02 NOTE — Patient Instructions (Signed)
Medication Instructions:  Your physician recommends that you continue on your current medications as directed. Please refer to the Current Medication list given to you today.  *If you need a refill on your cardiac medications before your next appointment, please call your pharmacy*   Lab Work: None.   Testing/Procedures: Your physician has requested that you have a lexiscan myoview. For further information please visit [email or web address] Please follow instruction sheet, as given.   The test will take approximately 3 to 4 hours to complete; you may bring reading material.  If someone comes with you to your appointment, they will need to remain in the main lobby due to limited space in the testing area. **If you are pregnant or breastfeeding, please notify the nuclear lab prior to your appointment**  How to prepare for your Myocardial Perfusion Test: Do not eat or drink 3 hours prior to your test, except you may have water. Do not consume products containing caffeine (regular or decaffeinated) 12 hours prior to your test. (ex: coffee, chocolate, sodas, tea). Do bring a list of your current medications with you.  If not listed below, you may take your medications as normal. Do wear comfortable clothes (no dresses or overalls) and walking shoes, tennis shoes preferred (No heels or open toe shoes are allowed). Do NOT wear cologne, perfume, aftershave, or lotions (deodorant is allowed). If these instructions are not followed, your test will have to be rescheduled.    Follow-Up: At [facility], you and your health needs are our priority.  As part of our continuing mission to provide you with exceptional heart care, we have created designated Provider Care Teams.  These Care Teams include your primary Cardiologist (physician) and Advanced Practice Providers (APPs -  Physician Assistants and Nurse Practitioners) who all work together to provide you with the care you need, when you need  it.   Your next appointment:   4 month(s)  Provider:   Olga Millers, MD

## 2022-09-02 NOTE — Progress Notes (Unsigned)
Cardiology Office Note:    Date:  09/03/2022   ID:  Desiree, Pratt 1951-03-22, MRN [id number]  PCP:  Sheliah Hatch, MD   [facility] Providers Cardiologist:  Olga Millers, MD     Referring MD: Sheliah Hatch, MD   Chief Complaint  Patient presents with   Pre-op Exam    Pending thyroid surgery    History of Present Illness:    [name] is a 72 y.o. female with a hx of DM2 and history of chest pain.  Patient was evaluated for chest pain in 2021.  Myoview in January 2021 showed EF 57%, breast attenuation but no ischemia.  Echocardiogram in January 2021 showed normal EF, severe LVH, mild MR, trace AI, mildly dilated ascending aorta at 38 mm, RV enlargement and dilated pulmonary artery.  ABI October 2021 was normal.  Chest CT in March 2022 showed thoracic aortic aneurysm measuring at 4.1 cm.  Patient was seen in the ED in March 2022 with chest pain.  Creatinine was 2.05 at that time.  She was felt to have musculoskeletal pain and was given prednisone.  She was last seen by Dr. Jens Som in March 2022 who felt her chronic intermittent chest pain is more consistent with musculoskeletal pain as it is reproducible with palpation.  More recently, patient was admitted to the hospital on 07/21/2022 was syncope while checking out at [facility].  She may have been hypoglycemic after a.m. insulin.  She was given glucose tab by EMS.  She had no chest pain or palpitation.  CT of head, C-spine and right elbow was negative.  Telemetry shows sinus bradycardia with heart rate in the high 40s to low 50s but no advanced heart block.  Echocardiogram obtained during the hospitalization showed EF 60 to 65%, trivial MR, trivial AI, dilated ascending aorta measuring 38 mm.  Was discharged on a 30-day heart monitor.  Losartan was increased to 50 mg daily due to elevated blood pressure.  Patient presents today for follow-up.  I have reviewed the heart monitor report with [name] and  her husband, she did not have significant pauses, severe bradycardia or tachycardia to explain the recent passing out spell.  EKG obtained in today's office visit showed sinus bradycardia with heart rate 44.  She does have dizziness when she get up too quickly or when she pick up something off the ground and then try to get up, this is clearly orthostatic dizziness rather than symptomatic bradycardia.  She is not on any AV nodal blocking agent.  Recent blood work shows that her renal function is worsening with creatinine went up from 1.6 to 2.7.  Dr. Beverely Low who ordered the test is in the process of discussing with Dr. Malen Gauze, her nephrologist.  Will defer to nephrology service for medication adjustment in this case.  She described 2 type of chest discomfort, one type is left-sided chest pain when she lay on the left side, symptom goes away when she lay on the right side, this is clearly musculoskeletal in nature.  She also has a substernal chest tightness that occur when she exert herself and walk around, she is not a candidate for coronary CT given poor renal function.  We will order Lexiscan Myoview.  She is aware that [name] has a small chance of slowing down her heart rate further.  Risk and benefit of Lexiscan has been discussed with both patient and her husband, they are agreeable to proceed.    Past  Medical History:  Diagnosis Date   Anginal pain (HCC) 03/19/2012   saw Dr. Melburn Popper .. she thinks its more related to stomach issues   Arthritis    "all over; mostly in my back" (03/20/2012)   Breast cancer (HCC)    "right" (03/20/2012)   Cardiomyopathy    Chest pain 06/2005   Hospitalized, dystolic dysfunction   Chronic lower back pain    "have 2 herniated discs; going to have to have a fusion" (03/20/2012)   CKD (chronic kidney disease), stage III (HCC)    Family history of anesthesia complication    "father has nausea and vomiting"   Gallstones    GERD (gastroesophageal reflux disease)     H/O hiatal hernia    History of kidney stones    History of right mastectomy    Hypertension    "patient states never had HTN, takes Hyzaar for heart   Hypothyroidism    Irritable bowel syndrome    Migraines    "it's been a long time since I've had one" (03/20/2012)   Osteoarthritis    Peripheral neuropathy    PONV (postoperative nausea and vomiting)    Sciatica    Type II diabetes mellitus (HCC)     Past Surgical History:  Procedure Laterality Date   ABDOMINAL HYSTERECTOMY  1993   adenosin cardiolite  07/2005   (+) wall motion abnormality   AXILLARY LYMPH NODE DISSECTION  2003   squamous cell cancer   BACK SURGERY     CARDIAC CATHETERIZATION  ? 2005 / 2007   CHOLECYSTECTOMY N/A 01/24/2018   Procedure: LAPAROSCOPIC CHOLECYSTECTOMY;  Surgeon: Harriette Bouillon, MD;  Location: [facility] OR;  Service: General;  Laterality: N/A;   CT abdomen and pelvis  02/2010   Same   ESOPHAGOGASTRODUODENOSCOPY  2005   GERD   KNEE ARTHROSCOPY  1990's   "right" (03/20/2012)   LUMBAR FUSION N/A 08/22/2014   Procedure: Right L2-3 and right L1-2 transforaminal lumbar interbody fusion with pedicle screws, rods, sleeves, cages, local bone graft, Vivigen, cancellous chips;  Surgeon: Kerrin Champagne, MD;  Location: [facility] OR;  Service: Orthopedics;  Laterality: N/A;   LUMBAR LAMINECTOMY/DECOMPRESSION MICRODISCECTOMY N/A 02/04/2014   Procedure: RIGHT L2-3 MICRODISCECTOMY ;  Surgeon: Kerrin Champagne, MD;  Location: [facility] OR;  Service: Orthopedics;  Laterality: N/A;   MASTECTOMY Right    MASTECTOMY WITH AXILLARY LYMPH NODE DISSECTION  12/2003   "right" (03/20/2012)   POSTERIOR CERVICAL FUSION/FORAMINOTOMY  2001   SHOULDER ARTHROSCOPY W/ ROTATOR CUFF REPAIR  2003   Left   THYROIDECTOMY  1977   Right   TOTAL KNEE ARTHROPLASTY  2000   Right   [place] of abdomen  07/2005   Fatty liver / kidney stones    Current Medications: Current Meds  Medication Sig   acetaminophen (TYLENOL) 500 MG tablet Take 500-1,000 mg by mouth  every 6 (six) hours as needed for moderate pain, headache or mild pain.   Alcohol Swabs (B-D SINGLE USE SWABS REGULAR) PADS Use as directed.   amLODipine (NORVASC) 10 MG tablet Take 1 tablet (10 mg total) by mouth daily.   aspirin 81 MG tablet Take 81 mg by mouth daily.   Blood Glucose Monitoring Suppl (TRUE METRIX AIR GLUCOSE METER) w/Device KIT 1 kit by Does not apply route 3 (three) times daily.   cetirizine (ZYRTEC) 10 MG tablet Take 1 tablet (10 mg total) by mouth daily.   Cholecalciferol (VITAMIN D) 50 MCG (2000 UT) tablet Take 2,000 Units by mouth daily.  dapagliflozin propanediol (FARXIGA) 10 MG TABS tablet Take by mouth daily.   DULoxetine (CYMBALTA) 20 MG capsule Take 20 mg by mouth daily.   famotidine (PEPCID) 40 MG tablet TAKE 1 TABLET BY MOUTH EVERY DAY (Patient taking differently: Take 40 mg by mouth daily.)   glucose blood (TRUE METRIX BLOOD GLUCOSE TEST) test strip Use as instructed   hydrALAZINE (APRESOLINE) 50 MG tablet TAKE 1 TABLET BY MOUTH TWICE A DAY   insulin NPH-regular Human (NOVOLIN 70/30) (70-30) 100 UNIT/ML injection INJECT 45 UNITS INTO THE SKIN 2 TIMES DAILY WITH A MEAL   levothyroxine (SYNTHROID) 100 MCG tablet Take 1 tablet (100 mcg total) by mouth daily.   losartan (COZAAR) 25 MG tablet Take 1 tablet (25 mg total) by mouth daily.   metoCLOPramide (REGLAN) 10 MG tablet TAKE 1 TABLET FOUR TIMES DAILY   pantoprazole (PROTONIX) 40 MG tablet Take 1 tablet (40 mg total) by mouth 2 (two) times daily.   Polyethyl Glycol-Propyl Glycol (SYSTANE ULTRA) 0.4-0.3 % SOLN Apply 1-2 drops to eye daily as needed (dry eyes).   polyethylene glycol (MIRALAX / GLYCOLAX) packet Take 17 g by mouth daily as needed for moderate constipation (MIX AND DRINK).   rosuvastatin (CRESTOR) 20 MG tablet TAKE 1 TABLET AT BEDTIME   sodium bicarbonate 650 MG tablet Take 650 mg by mouth 2 (two) times daily.   SUMAtriptan (IMITREX) 50 MG tablet TAKE 1 TAB EVERY 2 HRS AS NEEDED FOR MIGRAINE. MAY REPEAT  IN 2 HOURS IF HEADACHE PERSISTS OR RECURS. (Patient taking differently: Take 50 mg by mouth every 2 (two) hours as needed for migraine (May repeat in 2 hours if headache persists or recurs.).)   tizanidine (ZANAFLEX) 2 MG capsule Take 1 capsule (2 mg total) by mouth 3 (three) times daily as needed for muscle spasms.   torsemide (DEMADEX) 20 MG tablet Take 1 tablet (20 mg total) by mouth 2 (two) times daily.   TRUEplus Lancets 33G MISC Use as directed   Vitamin D, Ergocalciferol, (DRISDOL) 1.25 MG (50000 UNIT) CAPS capsule Take 1 capsule (50,000 Units total) by mouth every 7 (seven) days.     Allergies:   Dilaudid [hydromorphone hcl], Hydromorphone, Lipitor [atorvastatin], Adhesive [tape], Ampicillin, Latex, and Penicillins   Social History   Socioeconomic History   Marital status: Married    Spouse name: Not on file   Number of children: 1   Years of education: Not on file   Highest education level: Not on file  Occupational History   Occupation: Retired since 2003  Tobacco Use   Smoking status: Never   Smokeless tobacco: Never  Vaping Use   Vaping Use: Never used  Substance and Sexual Activity   Alcohol use: No   Drug use: No   Sexual activity: Not on file  Other Topics Concern   Not on file  Social History Narrative   Not on file   Social Determinants of Health   Financial Resource Strain: Medium Risk (12/23/2021)   Overall Financial Resource Strain (CARDIA)    Difficulty of Paying Living Expenses: Somewhat hard  Food Insecurity: No Food Insecurity (07/21/2022)   Hunger Vital Sign    Worried About Running Out of Food in the Last Year: Never true    Ran Out of Food in the Last Year: Never true  Transportation Needs: No Transportation Needs (07/21/2022)   PRAPARE - Administrator, Civil Service (Medical): No    Lack of Transportation (Non-Medical): No  Physical Activity: Insufficiently Active (  12/23/2021)   Exercise Vital Sign    Days of Exercise per Week: 3  days    Minutes of Exercise per Session: 30 min  Stress: No Stress Concern Present (12/23/2021)   [employer] of Occupational Health - Occupational Stress Questionnaire    Feeling of Stress : Not at all  Social Connections: Moderately Integrated (12/23/2021)   Social Connection and Isolation Panel [NHANES]    Frequency of Communication with Friends and Family: More than three times a week    Frequency of Social Gatherings with Friends and Family: More than three times a week    Attends Religious Services: More than 4 times per year    Active Member of [employer] or Organizations: No    Attends Engineer, structural: Never    Marital Status: Married     Family History: The patient's family history includes Breast cancer in her maternal aunt; Breast cancer (age of onset: 36) in her mother; Deep vein thrombosis in her daughter; Diabetes in her brother, brother, father, and another family member; Hypertension in her brother, brother, father, mother, sister, and another family member. There is no history of Colon cancer, Esophageal cancer, Liver cancer, Rectal cancer, or Stomach cancer.  ROS:   Please see the history of present illness.     All other systems reviewed and are negative.  EKGs/Labs/Other Studies Reviewed:    The following studies were reviewed today:  Echo 07/21/2022  1. Left ventricular ejection fraction, by estimation, is 60 to 65%. The  left ventricle has normal function. The left ventricle has no regional  wall motion abnormalities. There is mild concentric left ventricular  hypertrophy. Left ventricular diastolic  parameters are consistent with Grade I diastolic dysfunction (impaired  relaxation).   2. Right ventricular systolic function is normal. The right ventricular  size is mildly enlarged.   3. The mitral valve is degenerative. Trivial mitral valve regurgitation.  No evidence of mitral stenosis.   4. The aortic valve is normal in structure. Aortic valve  regurgitation is  trivial. No aortic stenosis is present.   5. Aortic dilatation noted. There is mild dilatation of the ascending  aorta, measuring 38 mm.   6. The inferior vena cava is normal in size with greater than 50%  respiratory variability, suggesting right atrial pressure of 3 mmHg.   EKG:  EKG is ordered today.  The ekg ordered today demonstrates sinus bradycardia, heart rate 44 bpm, first-degree AV block.  Recent Labs: 06/14/2022: ALT 8; TSH 1.72 07/25/2022: BUN 41; Creatinine, Ser 2.71; Hemoglobin 12.5; Platelets 308.0; Potassium 4.3; Sodium 139  Recent Lipid Panel    Component Value Date/Time   CHOL 133 06/14/2022 1122   CHOL 162 02/05/2021 0947   TRIG 225.0 (H) 06/14/2022 1122   HDL 41.50 06/14/2022 1122   HDL 51 02/05/2021 0947   CHOLHDL 3 06/14/2022 1122   VLDL 45.0 (H) 06/14/2022 1122   LDLCALC 76 02/05/2021 0947   LDLCALC 60 06/02/2020 0816   LDLDIRECT 58.0 06/14/2022 1122     Risk Assessment/Calculations:           Physical Exam:    VS:  BP 128/68 (BP Location: Left Arm, Patient Position: Sitting, Cuff Size: Large)   Pulse (!) 46   Ht 5\' 4"  (1.626 m)   Wt 186 lb 3.2 oz (84.5 kg)   SpO2 92%   BMI 31.96 kg/m         Wt Readings from Last 3 Encounters:  09/02/22 186 lb 3.2  oz (84.5 kg)  08/10/22 176 lb (79.8 kg)  07/26/22 179 lb 3.2 oz (81.3 kg)     GEN:  Well nourished, well developed in no acute distress HEENT: Normal NECK: No JVD; No carotid bruits LYMPHATICS: No lymphadenopathy CARDIAC: RRR, no murmurs, rubs, gallops RESPIRATORY:  Clear to auscultation without rales, wheezing or rhonchi  ABDOMEN: Soft, non-tender, non-distended MUSCULOSKELETAL:  No edema; No deformity  SKIN: Warm and dry NEUROLOGIC:  Alert and oriented x 3 PSYCHIATRIC:  Normal affect   ASSESSMENT:    1. Pre-operative clearance   2. Chest pain of uncertain etiology   3. Acute on chronic renal insufficiency   4. Controlled type 2 diabetes mellitus without  complication, without long-term current use of insulin (HCC)   5. Sinus bradycardia    PLAN:    In order of problems listed above:  Preop clearance: Upcoming thyroid surgery.  She has 2 different type of chest pain, she is not a good candidate for coronary CT or cardiac catheterization, will proceed with [facility] for risk stratification.  If low risk, patient may proceed from the cardiac perspective  Chest discomfort: Patient actually have 2 type of chest pain.  She has a left-sided chest discomfort that is worse when laying on that side, symptoms resolved when she lay on her right side.  This is clearly noncardiac.  She does have a second hyper chest discomfort when she ambulate.  She is not a very active person.  Given upcoming surgery, we will obtain a Lexiscan Myoview.  Risk and the benefit has been discussed with the patient.  She is aware that [name] has a small chance of slowing down the heart rate.  Acute on chronic renal insufficiency: This was seen on lab work by Dr. Beverely Low, based on Dr. Rennis Golden note, she was reaching out to Dr. Malen Gauze her nephrologist to discuss medication adjustment.  Medication that can affect her renal function include Farxiga, losartan, and torsemide.  DM2: Managed by primary care provider  Sinus bradycardia: Today's EKG shows her heart rate was 44 bpm in sinus bradycardia.  She is asymptomatic with the current heart rate.  Recent heart monitor was reassuring.  I reviewed the case with DOD Dr. [place], if Myoview is negative, she is at acceptable risk to undergo low risk thyroid surgery.      Shared Decision Making/Informed Consent The risks [chest pain, shortness of breath, cardiac arrhythmias, dizziness, blood pressure fluctuations, myocardial infarction, stroke/transient ischemic attack, nausea, vomiting, allergic reaction, radiation exposure, metallic taste sensation and life-threatening complications (estimated to be 1 in 10,000)], benefits (risk  stratification, diagnosing coronary artery disease, treatment guidance) and alternatives of a nuclear stress test were discussed in detail with Ms. Coonfield and she agrees to proceed.    Medication Adjustments/Labs and Tests Ordered: Current medicines are reviewed at length with the patient today.  Concerns regarding medicines are outlined above.  Orders Placed This Encounter  Procedures   Cardiac Stress Test: Informed Consent Details: Physician/Practitioner Attestation; Transcribe to consent form and obtain patient signature   MYOCARDIAL PERFUSION IMAGING   EKG 12-Lead   No orders of the defined types were placed in this encounter.   Patient Instructions  Medication Instructions:  Your physician recommends that you continue on your current medications as directed. Please refer to the Current Medication list given to you today.  *If you need a refill on your cardiac medications before your next appointment, please call your pharmacy*   Lab Work: None.   Testing/Procedures: Your  physician has requested that you have a lexiscan myoview. For further information please visit [email or web address] Please follow instruction sheet, as given.   The test will take approximately 3 to 4 hours to complete; you may bring reading material.  If someone comes with you to your appointment, they will need to remain in the main lobby due to limited space in the testing area. **If you are pregnant or breastfeeding, please notify the nuclear lab prior to your appointment**  How to prepare for your Myocardial Perfusion Test: Do not eat or drink 3 hours prior to your test, except you may have water. Do not consume products containing caffeine (regular or decaffeinated) 12 hours prior to your test. (ex: coffee, chocolate, sodas, tea). Do bring a list of your current medications with you.  If not listed below, you may take your medications as normal. Do wear comfortable clothes (no dresses or overalls) and  walking shoes, tennis shoes preferred (No heels or open toe shoes are allowed). Do NOT wear cologne, perfume, aftershave, or lotions (deodorant is allowed). If these instructions are not followed, your test will have to be rescheduled.    Follow-Up: At [facility], you and your health needs are our priority.  As part of our continuing mission to provide you with exceptional heart care, we have created designated Provider Care Teams.  These Care Teams include your primary Cardiologist (physician) and Advanced Practice Providers (APPs -  Physician Assistants and Nurse Practitioners) who all work together to provide you with the care you need, when you need it.   Your next appointment:   4 month(s)  Provider:   Olga Millers, MD      Signed, Azalee Course, [place]  09/03/2022 7:55 PM    [facility]

## 2022-09-03 ENCOUNTER — Encounter: Payer: Self-pay | Admitting: Physician Assistant

## 2022-09-05 IMAGING — MR MR THORACIC SPINE W/O CM
4 of 6 series · 18 of 48 positions shown · non-contrast
Comparison: MRI 05/28/2017.  CT 10/25/2019

CLINICAL DATA: Back pain.  History of breast cancer.

EXAM:
MRI THORACIC SPINE WITHOUT CONTRAST
TECHNIQUE: Multiplanar, multisequence MR imaging of the thoracic spine was
performed. No intravenous contrast was administered.

[Series 4: T2 · sagittal · 4.0mm · 0.53mm/px · 6 of 17 slices shown (1 of 3)]
[im 1/17]
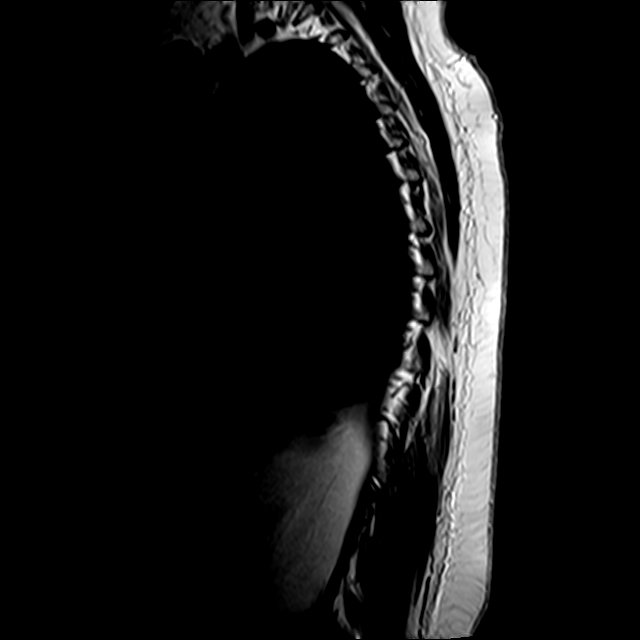
[im 4/17]
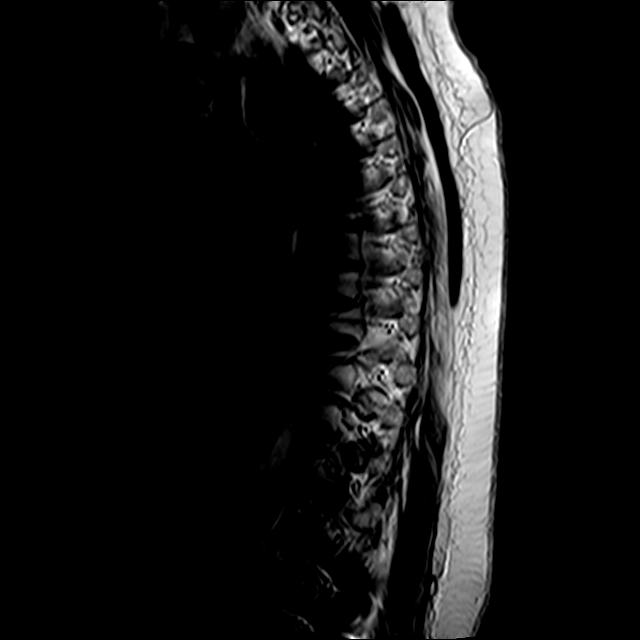
[im 7/17]
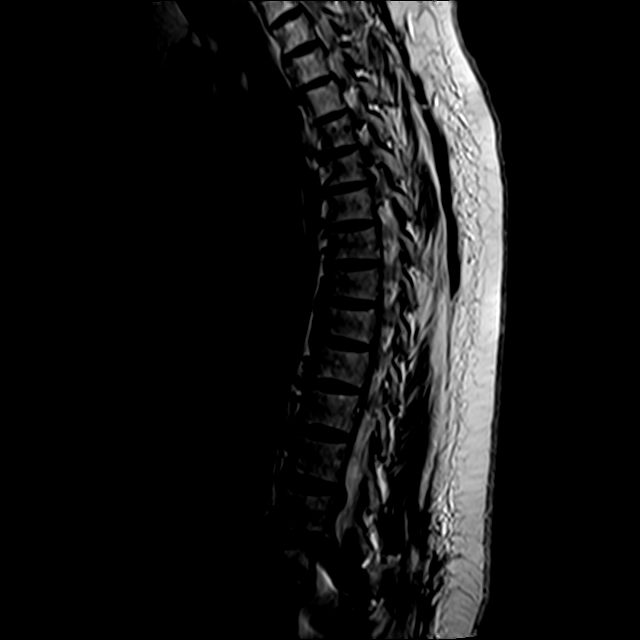
[im 10/17]
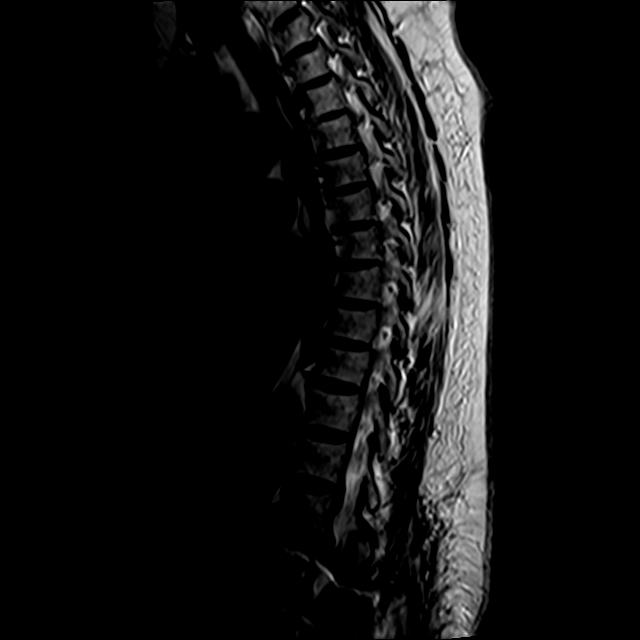
[im 13/17]
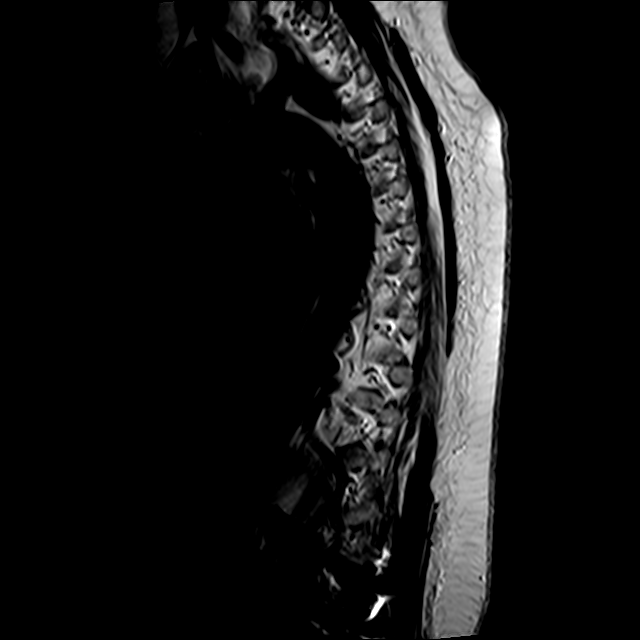
[im 17/17]
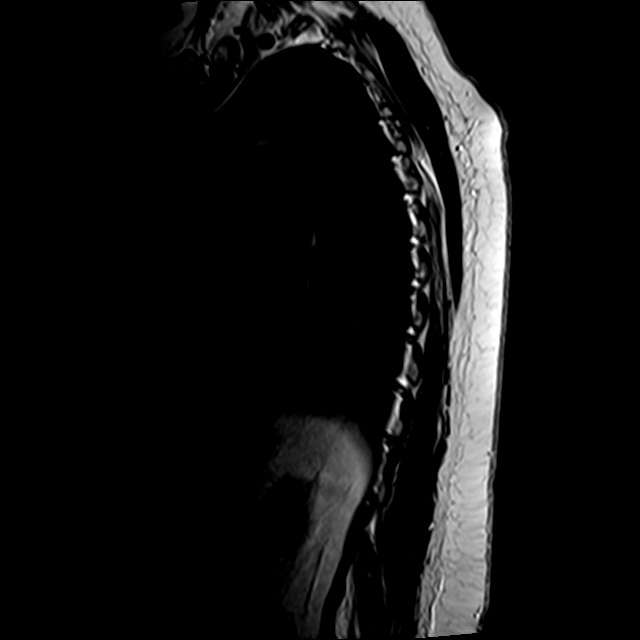

[Series 6: T1 · sagittal · 4.0mm · 1.06mm/px · 3 of 17 slices shown]
[im 4/17]
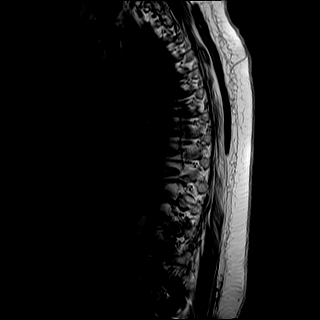
[im 10/17]
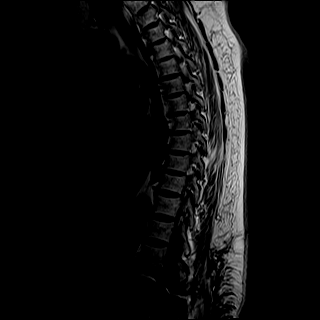
[im 17/17]
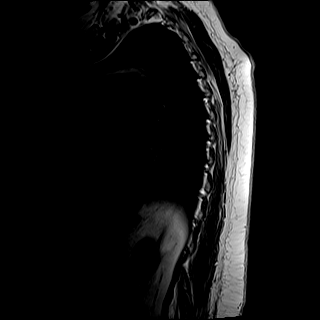

[Series 7: T2 · axial · 4.0mm · 0.39mm/px · z∈[-267,-87]mm · 6 of 36 slices shown (2 of 3)]
[im 1/36]
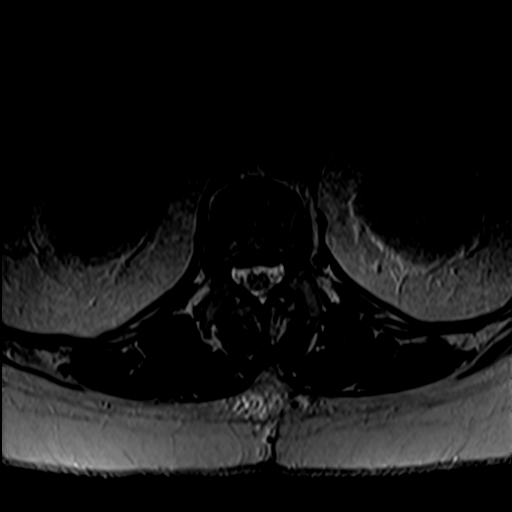
[im 7/36]
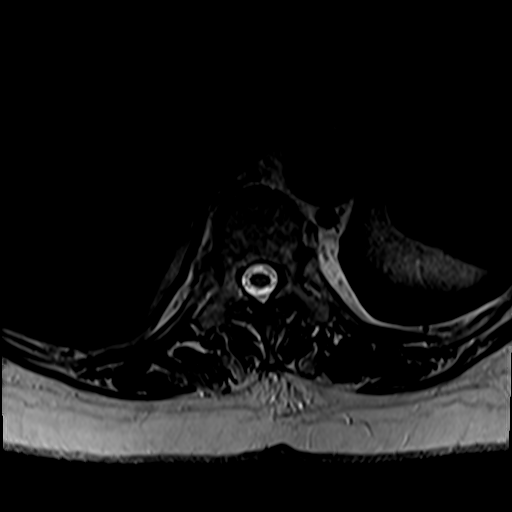
[im 10/36]
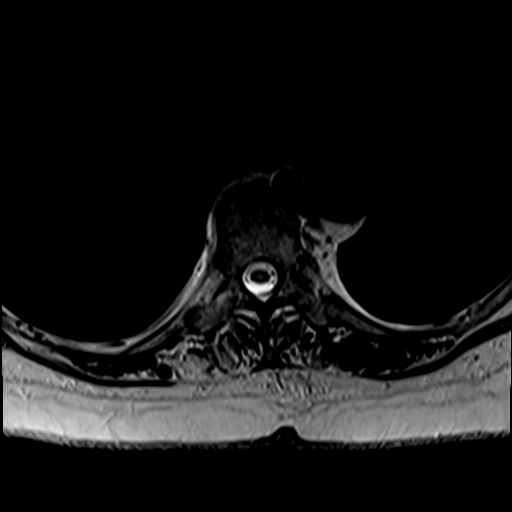
[im 16/36]
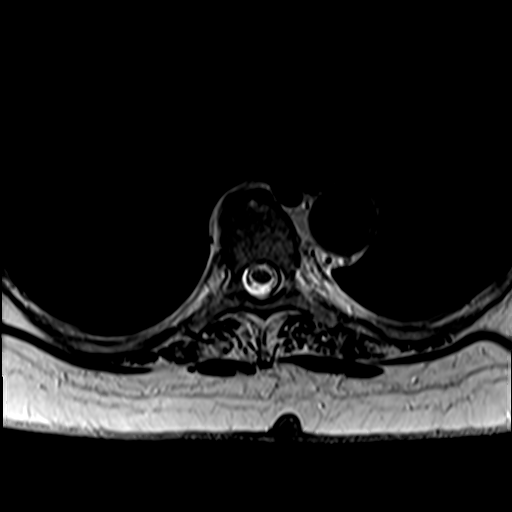
[im 20/36]
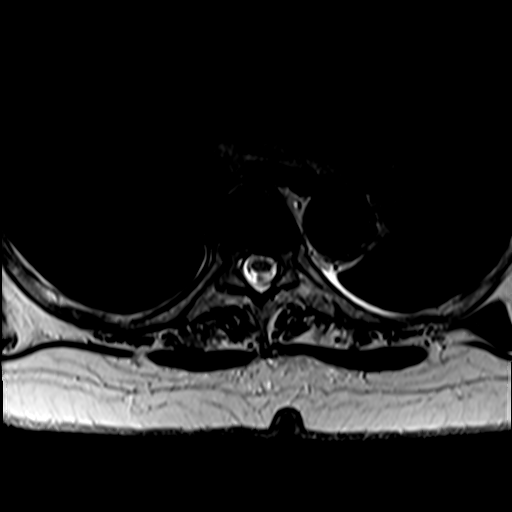
[im 32/36]
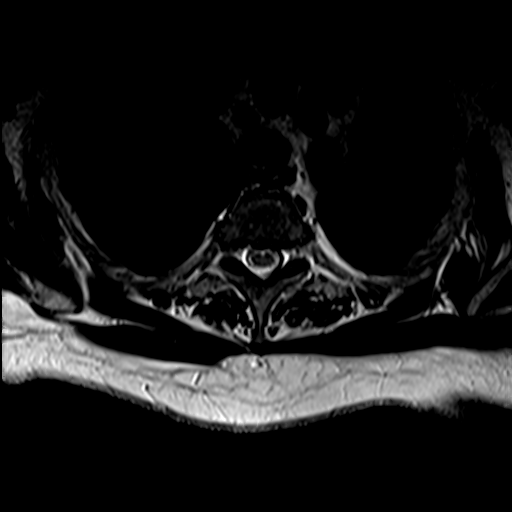

[Series 8: T2 · axial · 4.0mm · 0.39mm/px · z∈[-210,-87]mm · 3 of 36 slices shown (3 of 3)]
[im 7/36]
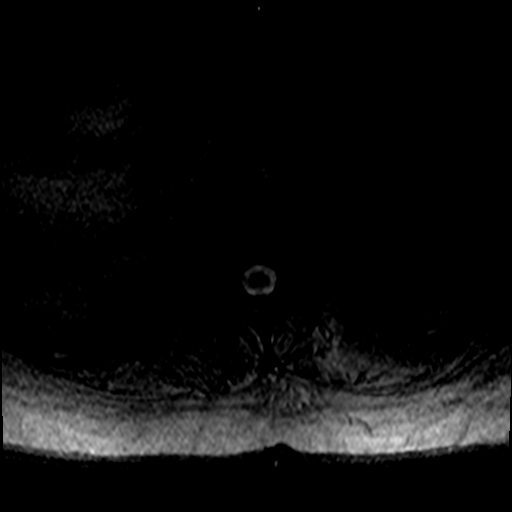
[im 20/36]
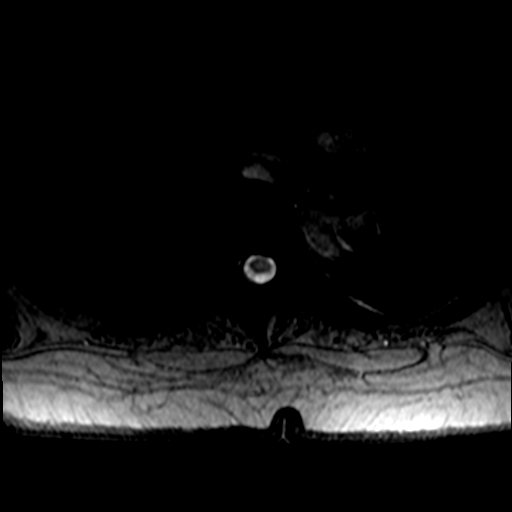
[im 32/36]
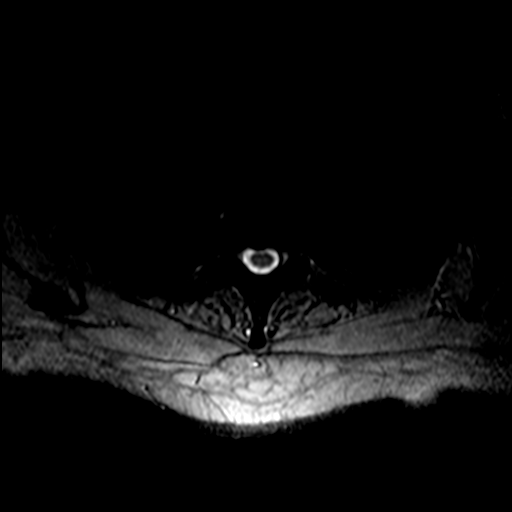

[18 of 48 positions shown; findings below may reference images not displayed]

FINDINGS: Alignment: Preserved thoracic kyphosis without static listhesis.
There is retrolisthesis of L1 on L2 within the visualized upper
lumbar spine.

Vertebrae: Thoracic vertebral body heights are maintained without
fracture. No evidence of discitis. No suspicious bone lesion.
Partially visualized lumbar fusion hardware. The transpedicular
screws at the L2 and L3 levels are protruding through the superior
endplates, as seen on previous studies, unchanged.

Cord:  Normal signal and morphology.

Paraspinal and other soft tissues: Negative.

Disc levels:

T1-T2: Tiny central protrusion.  No canal or foraminal stenosis.

T2-T3: Tiny central protrusion.  No foraminal or canal stenosis.

T3-T4: Negative.

T4-T5: Tiny central protrusion.  No foraminal or canal stenosis.

T5-T6: Tiny central protrusion.  No foraminal or canal stenosis.

T6-T7: Tiny central protrusion.  No foraminal or canal stenosis.

T7-T8: Tiny central protrusion.  No foraminal or canal stenosis.

T8-T9: Negative.

T9-T10: Negative.

T10-T11: Negative.

T11-T12: Negative.

T12-L1: Central disc extrusion with caudal migration, unchanged. No
foraminal or canal stenosis.
IMPRESSION: 1. No acute abnormality of the thoracic spine.
2. Mild degenerative changes of the thoracic spine, similar in
appearance to prior MRI. No foraminal or canal stenosis at any
level.
3. Partially visualized lumbar fusion hardware with transpedicular
screws at the L2 and L3 levels protruding through the superior
endplates, unchanged from prior studies.

## 2022-09-06 ENCOUNTER — Telehealth (HOSPITAL_COMMUNITY): Payer: Self-pay | Admitting: *Deleted

## 2022-09-06 NOTE — Telephone Encounter (Signed)
Pt reached and given instructions for MPI study. 

## 2022-09-08 ENCOUNTER — Ambulatory Visit (HOSPITAL_COMMUNITY): Payer: Medicare HMO | Attending: Physician Assistant

## 2022-09-08 DIAGNOSIS — Z01818 Encounter for other preprocedural examination: Secondary | ICD-10-CM | POA: Diagnosis not present

## 2022-09-08 LAB — MYOCARDIAL PERFUSION IMAGING
Base ST Depression (mm): 0 mm
LV dias vol: 70 mL (ref 46–106)
LV sys vol: 33 mL
Nuc Stress EF: 63 %
Peak HR: 83 {beats}/min
Rest HR: 56 {beats}/min
Rest Nuclear Isotope Dose: 10.6 mCi
SDS: 2
SRS: 1
SSS: 3
ST Depression (mm): 0 mm
Stress Nuclear Isotope Dose: 31.8 mCi
TID: 0.74

## 2022-09-08 MED ORDER — REGADENOSON 0.4 MG/5ML IV SOLN
0.4000 mg | Freq: Once | INTRAVENOUS | Status: AC
Start: 2022-09-08 — End: 2022-09-08
  Administered 2022-09-08: 0.4 mg via INTRAVENOUS

## 2022-09-08 MED ORDER — TECHNETIUM TC 99M TETROFOSMIN IV KIT
31.8000 | PACK | Freq: Once | INTRAVENOUS | Status: AC | PRN
  Administered 2022-09-08: 31.8 via INTRAVENOUS

## 2022-09-08 MED ORDER — TECHNETIUM TC 99M TETROFOSMIN IV KIT
10.6000 | PACK | Freq: Once | INTRAVENOUS | Status: AC | PRN
  Administered 2022-09-08: 10.6 via INTRAVENOUS

## 2022-09-11 NOTE — Progress Notes (Signed)
Pt had a skin tear, need tdap updated

## 2022-09-12 ENCOUNTER — Telehealth: Payer: Self-pay | Admitting: Pharmacist

## 2022-09-12 NOTE — Progress Notes (Unsigned)
Care Management & Coordination Services Pharmacy Team  Reason for Encounter: Diabetes  Contacted patient to discuss Diabetes disease state. {US HC Outreach:28874}  Current antihyperglycemic regimen:  dapagliflozin propanediol (FARXIGA) 10 MG TABS tablet  insulin NPH-regular Human (NOVOLIN 70/30) (70-30) 100 UNIT/ML injection 45 units BID    Patient verbally confirms she is taking the above medications as directed. {yes/no:20286}  What diet changes have been made to improve diabetes control? Patient reported  What recent interventions/DTPs have been made to improve glycemic control:  Patient reported  Have there been any recent hospitalizations or ED visits since last visit with PharmD? Yes   Patient {reports/denies:24182} hypoglycemic symptoms, including {Hypoglycemic Symptoms:3049003}  Patient {reports/denies:24182} hyperglycemic symptoms, including {symptoms; hyperglycemia:17903}  How often are you checking your blood sugar? {BG Testing frequency:23922}  What are your blood sugars ranging?  Fasting: *** Before meals: *** After meals: *** Bedtime: ***  During the week, how often does your blood glucose drop below 70? {LowBGfrequency:24142}  Are you checking your feet daily/regularly? Yes  Adherence Review: Is the patient currently on a STATIN medication? Yes Is the patient currently on ACE/ARB medication? Yes Does the patient have >5 day gap between last estimated fill dates? Yes   Chart Updates:  Recent office visits:  07/25/22 Neena Rhymes, MD - Family Medicine - Syncope - Labs were ordered. Cetirizine (ZYRTEC) 10 MG tablet  prescribed. Follow up as scheduled.   06/14/22 Neena Rhymes, MD - Family Medicine - Physical - Labs were ordered. Referral placed for GI and Urology. Follow up in 6 months.   Recent consult visits:  09/02/22 Azalee Course PA - Cardiology - Pre op clearance - EKG performed. Myocardial stress test performed. Follow up in 4 months.  08/24/22  Axel Filler MD - General Surgery - Hyperparathyroidism - discussed thyroid removal. Referral placed for pre op clearance. Follow up as scheduled.   07/26/22 Ronny Bacon NP - Diabetes - Endocrinology - Insulin NPH-regular Human (NOVOLIN 70/30) (70-30) 100 UNIT/ML injection (DECREASED DOSE) prescribed.   05/26/22 Vallery Sa - Nephology - Diabetes - No notes available.   Hospital visits: 07/20/22 - 07/22/22  Seen 07/20/22 for Syncope. Discharged 07/22/22 from [facility].   Carvedilol 6.25 MG tablet (COREG) discontinued.  Medications: Outpatient Encounter Medications as of 09/12/2022  Medication Sig   acetaminophen (TYLENOL) 500 MG tablet Take 500-1,000 mg by mouth every 6 (six) hours as needed for moderate pain, headache or mild pain.   Alcohol Swabs (B-D SINGLE USE SWABS REGULAR) PADS Use as directed.   amLODipine (NORVASC) 10 MG tablet Take 1 tablet (10 mg total) by mouth daily.   aspirin 81 MG tablet Take 81 mg by mouth daily.   Blood Glucose Monitoring Suppl (TRUE METRIX AIR GLUCOSE METER) w/Device KIT 1 kit by Does not apply route 3 (three) times daily.   cetirizine (ZYRTEC) 10 MG tablet Take 1 tablet (10 mg total) by mouth daily.   Cholecalciferol (VITAMIN D) 50 MCG (2000 UT) tablet Take 2,000 Units by mouth daily.   dapagliflozin propanediol (FARXIGA) 10 MG TABS tablet Take by mouth daily.   DULoxetine (CYMBALTA) 20 MG capsule Take 20 mg by mouth daily.   famotidine (PEPCID) 40 MG tablet TAKE 1 TABLET BY MOUTH EVERY DAY (Patient taking differently: Take 40 mg by mouth daily.)   glucose blood (TRUE METRIX BLOOD GLUCOSE TEST) test strip Use as instructed   hydrALAZINE (APRESOLINE) 50 MG tablet TAKE 1 TABLET BY MOUTH TWICE A DAY   insulin NPH-regular Human (NOVOLIN 70/30) (  70-30) 100 UNIT/ML injection INJECT 45 UNITS INTO THE SKIN 2 TIMES DAILY WITH A MEAL   levothyroxine (SYNTHROID) 100 MCG tablet Take 1 tablet (100 mcg total) by mouth daily.   losartan (COZAAR) 25 MG tablet  Take 1 tablet (25 mg total) by mouth daily.   metoCLOPramide (REGLAN) 10 MG tablet TAKE 1 TABLET FOUR TIMES DAILY   pantoprazole (PROTONIX) 40 MG tablet Take 1 tablet (40 mg total) by mouth 2 (two) times daily.   Polyethyl Glycol-Propyl Glycol (SYSTANE ULTRA) 0.4-0.3 % SOLN Apply 1-2 drops to eye daily as needed (dry eyes).   polyethylene glycol (MIRALAX / GLYCOLAX) packet Take 17 g by mouth daily as needed for moderate constipation (MIX AND DRINK).   rosuvastatin (CRESTOR) 20 MG tablet TAKE 1 TABLET AT BEDTIME   sodium bicarbonate 650 MG tablet Take 650 mg by mouth 2 (two) times daily.   SUMAtriptan (IMITREX) 50 MG tablet TAKE 1 TAB EVERY 2 HRS AS NEEDED FOR MIGRAINE. MAY REPEAT IN 2 HOURS IF HEADACHE PERSISTS OR RECURS. (Patient taking differently: Take 50 mg by mouth every 2 (two) hours as needed for migraine (May repeat in 2 hours if headache persists or recurs.).)   tizanidine (ZANAFLEX) 2 MG capsule Take 1 capsule (2 mg total) by mouth 3 (three) times daily as needed for muscle spasms.   torsemide (DEMADEX) 20 MG tablet Take 1 tablet (20 mg total) by mouth 2 (two) times daily.   TRUEplus Lancets 33G MISC Use as directed   Vitamin D, Ergocalciferol, (DRISDOL) 1.25 MG (50000 UNIT) CAPS capsule Take 1 capsule (50,000 Units total) by mouth every 7 (seven) days.   No facility-administered encounter medications on file as of 09/12/2022.    Recent Relevant Labs: Lab Results  Component Value Date/Time   HGBA1C 8.4 (H) 06/14/2022 11:22 AM   HGBA1C 8.3 (H) 03/17/2022 11:53 AM   HGBA1C 9.9 (A) 10/28/2020 01:16 PM   HGBA1C 7.9 (A) 06/08/2020 11:17 AM   MICROALBUR 570.4 08/05/2019 03:15 PM   MICROALBUR 188.7 06/01/2018 03:25 PM    Kidney Function Lab Results  Component Value Date/Time   CREATININE 2.71 (H) 07/25/2022 11:44 AM   CREATININE 2.03 (H) 07/22/2022 01:14 AM   CREATININE 2.01 (H) 06/02/2020 08:16 AM   CREATININE 1.64 (H) 09/19/2019 02:20 PM   CREATININE 1.55 (H) 08/05/2019 03:15 PM    GFR 16.86 (L) 07/25/2022 11:44 AM   GFRNONAA 25 (L) 07/22/2022 01:14 AM   GFRNONAA 31 (L) 09/19/2019 02:20 PM   GFRNONAA 38 (L) 03/01/2019 02:07 PM   GFRAA 25 (L) 10/19/2019 03:30 AM   GFRAA 36 (L) 09/19/2019 02:20 PM   GFRAA 44 (L) 03/01/2019 02:07 PM    Star Rating Drugs:  Medication:   Last Fill: Day Supply  rosuvastatin 20 MG tablet  08/17/22  90 losartan 25 MG tablet  08/11/21  90 Farxiga 10MG  tablet   02/19/22 30? PAP  Care Gaps: Annual wellness visit in last year? Yes done 12/23/21 Last eye exam / retinopathy screening: due 05/05/23 Last diabetic foot exam: due 06/14/23   Future Appointments  Date Time Provider Department Center  10/03/2022 11:20 AM Hilarie Fredrickson, MD [facility]  11/01/2022 11:30 AM Dani Gobble, NP [facility] None  12/15/2022 10:20 AM Sheliah Hatch, MD [facility]  01/05/2023  3:00 PM [facility] ANNUAL WELLNESS VISIT [facility]  01/13/2023 11:30 AM Crenshaw, Madolyn Frieze, MD [facility] None    [address], [place]

## 2022-09-12 NOTE — Telephone Encounter (Addendum)
   Patient Name: [name]  DOB: 14-Jan-1951 MRN: [id number]  Primary Cardiologist: Olga Millers, MD  Chart reviewed as part of pre-operative protocol coverage. Given past medical history and time since last visit, based on ACC/AHA guidelines, [name] is at acceptable risk for the planned procedure without further cardiovascular testing. Recent myoview was normal. May hold ASA for 5-7 days prior to the procedure and restart as soon as possible afterward at the surgeon's discretion.   The patient was advised that if she develops new symptoms prior to surgery to contact our office to arrange for a follow-up visit, and she verbalized understanding.  I will route this recommendation to the requesting party via Epic fax function and remove from pre-op pool.  Please call with questions.  [place], [place] 09/12/2022, 9:52 AM

## 2022-10-03 ENCOUNTER — Ambulatory Visit: Payer: Medicare HMO | Admitting: Internal Medicine

## 2022-10-03 ENCOUNTER — Encounter: Payer: Self-pay | Admitting: Internal Medicine

## 2022-10-03 VITALS — BP 128/70 | HR 70 | Ht 64.0 in | Wt 177.0 lb

## 2022-10-03 DIAGNOSIS — E119 Type 2 diabetes mellitus without complications: Secondary | ICD-10-CM | POA: Diagnosis not present

## 2022-10-03 DIAGNOSIS — R195 Other fecal abnormalities: Secondary | ICD-10-CM

## 2022-10-03 DIAGNOSIS — K219 Gastro-esophageal reflux disease without esophagitis: Secondary | ICD-10-CM | POA: Diagnosis not present

## 2022-10-03 DIAGNOSIS — Z794 Long term (current) use of insulin: Secondary | ICD-10-CM | POA: Diagnosis not present

## 2022-10-03 MED ORDER — NA SULFATE-K SULFATE-MG SULF 17.5-3.13-1.6 GM/177ML PO SOLN: 1.0000 | Freq: Once | ORAL | 0 refills | Status: AC

## 2022-10-03 NOTE — Pre-Procedure Instructions (Signed)
Surgical Instructions    Your procedure is scheduled on Tuesday, July 2nd.  Report to [facility] Main Entrance "A" at 12:45 P.M., then check in with the Admitting office.  Call this number if you have problems the morning of surgery:  [phone]  If you have any questions prior to your surgery date call [phone]: Open Monday-Friday 8am-4pm If you experience any cold or flu symptoms such as cough, fever, chills, shortness of breath, etc. between now and your scheduled surgery, please notify us at the above number.     Remember:  Do not eat after midnight the night before your surgery  You may drink clear liquids until 11:45 AM the morning of your surgery.   Clear liquids allowed are: Water, Non-Citrus Juices (without pulp), Carbonated Beverages, Clear Tea, Black Coffee Only (NO MILK, CREAM OR POWDERED CREAMER of any kind), and Gatorade.   Patient Instructions  The night before surgery:  No food after midnight. ONLY clear liquids after midnight   The day of surgery (if you have diabetes): Drink ONE (1) 12 oz G2 given to you in your pre admission testing appointment by 11:45 AM the morning of surgery. Drink in one sitting. Do not sip.  This drink was given to you during your hospital  pre-op appointment visit.  Nothing else to drink after completing the  12 oz bottle of G2.         If you have questions, please contact your surgeon's office.     Take these medicines the morning of surgery with A SIP OF WATER  amLODipine (NORVASC)  hydrALAZINE (APRESOLINE)  levothyroxine (SYNTHROID)  metoCLOPramide (REGLAN)    If needed: acetaminophen (TYLENOL)  Polyethyl Glycol-Propyl Glycol (SYSTANE ULTRA)  SUMAtriptan (IMITREX)     Follow your surgeon's instructions on when to stop Aspirin.  If no instructions were given by your surgeon then you will need to call the office to get those instructions.     As of today, STOP taking any Aleve, Naproxen, Ibuprofen, Motrin, Advil,  Goody's, BC's, all herbal medications, fish oil, and all vitamins.  WHAT DO I DO ABOUT MY DIABETES MEDICATION?  HOLD dapagliflozin propanediol (FARXIGA) FOR 72 HOURS PRIOR TO SURGERY. Last dose 6/28.     THE NIGHT BEFORE SURGERY, TAKE 15 UNITS (70%) OF insulin NPH-regular Human (NOVOLIN 70/30). DO NOT TAKE ON THE MORNING OF SURGERY.   If your CBG is greater than 220 mg/dL, you may take  of your sliding scale (correction) dose of insulin.   HOW TO MANAGE YOUR DIABETES BEFORE AND AFTER SURGERY  Why is it important to control my blood sugar before and after surgery? Improving blood sugar levels before and after surgery helps healing and can limit problems. A way of improving blood sugar control is eating a healthy diet by:  Eating less sugar and carbohydrates  Increasing activity/exercise  Talking with your doctor about reaching your blood sugar goals High blood sugars (greater than 180 mg/dL) can raise your risk of infections and slow your recovery, so you will need to focus on controlling your diabetes during the weeks before surgery. Make sure that the doctor who takes care of your diabetes knows about your planned surgery including the date and location.  How do I manage my blood sugar before surgery? Check your blood sugar at least 4 times a day, starting 2 days before surgery, to make sure that the level is not too high or low.  Check your blood sugar the morning of your surgery  when you wake up and every 2 hours until you get to the Short Stay unit.  If your blood sugar is less than 70 mg/dL, you will need to treat for low blood sugar: Do not take insulin. Treat a low blood sugar (less than 70 mg/dL) with  cup of clear juice (cranberry or apple), 4 glucose tablets, OR glucose gel. Recheck blood sugar in 15 minutes after treatment (to make sure it is greater than 70 mg/dL). If your blood sugar is not greater than 70 mg/dL on recheck, call [phone] for further  instructions. Report your blood sugar to the short stay nurse when you get to Short Stay.  If you are admitted to the hospital after surgery: Your blood sugar will be checked by the staff and you will probably be given insulin after surgery (instead of oral diabetes medicines) to make sure you have good blood sugar levels. The goal for blood sugar control after surgery is 80-180 mg/dL.                     Do NOT Smoke (Tobacco/Vaping) for 24 hours prior to your procedure.  If you use a CPAP at night, you may bring your mask/headgear for your overnight stay.   Contacts, glasses, piercing's, hearing aid's, dentures or partials may not be worn into surgery, please bring cases for these belongings.    For patients admitted to the hospital, discharge time will be determined by your treatment team.   Patients discharged the day of surgery will not be allowed to drive home, and someone needs to stay with them for 24 hours.  SURGICAL WAITING ROOM VISITATION Patients having surgery or a procedure may have no more than 2 support people in the waiting area - these visitors may rotate.   Children under the age of 69 must have an adult with them who is not the patient. If the patient needs to stay at the hospital during part of their recovery, the visitor guidelines for inpatient rooms apply. Pre-op nurse will coordinate an appropriate time for 1 support person to accompany patient in pre-op.  This support person may not rotate.   Please refer to the [facility] website for the visitor guidelines for Inpatients (after your surgery is over and you are in a regular room).    Special instructions:   [facility]- Preparing For Surgery  Before surgery, you can play an important role. Because skin is not sterile, your skin needs to be as free of germs as possible. You can reduce the number of germs on your skin by washing with CHG (chlorahexidine gluconate) Soap before surgery.  CHG is an antiseptic cleaner  which kills germs and bonds with the skin to continue killing germs even after washing.    Oral Hygiene is also important to reduce your risk of infection.  Remember - BRUSH YOUR TEETH THE MORNING OF SURGERY WITH YOUR REGULAR TOOTHPASTE  Please do not use if you have an allergy to CHG or antibacterial soaps. If your skin becomes reddened/irritated stop using the CHG.  Do not shave (including legs and underarms) for at least 48 hours prior to first CHG shower. It is OK to shave your face.  Please follow these instructions carefully.   Shower the NIGHT BEFORE SURGERY and the MORNING OF SURGERY  If you chose to wash your hair, wash your hair first as usual with your normal shampoo.  After you shampoo, rinse your hair and body thoroughly to remove the  shampoo.  Use CHG Soap as you would any other liquid soap. You can apply CHG directly to the skin and wash gently with a scrungie or a clean washcloth.   Apply the CHG Soap to your body ONLY FROM THE NECK DOWN.  Do not use on open wounds or open sores. Avoid contact with your eyes, ears, mouth and genitals (private parts). Wash Face and genitals (private parts)  with your normal soap.   Wash thoroughly, paying special attention to the area where your surgery will be performed.  Thoroughly rinse your body with warm water from the neck down.  DO NOT shower/wash with your normal soap after using and rinsing off the CHG Soap.  Pat yourself dry with a CLEAN TOWEL.  Wear CLEAN PAJAMAS to bed the night before surgery  Place CLEAN SHEETS on your bed the night before your surgery  DO NOT SLEEP WITH PETS.   Day of Surgery: Take a shower with CHG soap. Do not wear jewelry or makeup Do not wear lotions, powders, perfumes/colognes, or deodorant. Do not shave 48 hours prior to surgery.  Men may shave face and neck. Do not bring valuables to the hospital.  [facility] is not responsible for any belongings or valuables. Do not wear nail polish, gel  polish, artificial nails, or any other type of covering on natural nails (fingers and toes) If you have artificial nails or gel coating that need to be removed by a nail salon, please have this removed prior to surgery. Artificial nails or gel coating may interfere with anesthesia's ability to adequately monitor your vital signs. Wear Clean/Comfortable clothing the morning of surgery Remember to brush your teeth WITH YOUR REGULAR TOOTHPASTE.   Please read over the following fact sheets that you were given.    If you received a COVID test during your pre-op visit  it is requested that you wear a mask when out in public, stay away from anyone that may not be feeling well and notify your surgeon if you develop symptoms. If you have been in contact with anyone that has tested positive in the last 10 days please notify you surgeon.

## 2022-10-03 NOTE — Progress Notes (Signed)
HISTORY OF PRESENT ILLNESS:  [name] is a 72 y.o. female with multiple medical problems including insulin requiring type 2 diabetes mellitus who is sent today by her primary care provider regarding positive Cologuard testing.  I last saw the patient in the office April 2021 regarding GERD and questionable dysphagia.  She subsequently underwent upper endoscopy August 09, 2019.  The examination was normal.  She has not been seen since.  She continues on pantoprazole for control of reflux symptoms.  On medication, no upper GI complaints.  She did undergo colonoscopy with [facility] in 2011.  Examination revealed sigmoid diverticulosis but was otherwise normal.  Patient states she underwent Cologuard testing because it had been more than 10 years since her last colonoscopy.  Testing returned + March 2024.  Review of blood work from April 2024 shows a hemoglobin of 12.5.  She has renal insufficiency with a creatinine of 2.71.  She does have chronic constipation.  No other relevant GI complaints.  No family history of colon cancer.  REVIEW OF SYSTEMS:  All non-GI ROS negative unless otherwise stated in the HPI except for arthritis  Past Medical History:  Diagnosis Date   Anginal pain (HCC) 03/19/2012   saw Dr. Melburn Popper .. she thinks its more related to stomach issues   Arthritis    "all over; mostly in my back" (03/20/2012)   Breast cancer (HCC)    "right" (03/20/2012)   Cardiomyopathy    Chest pain 06/2005   Hospitalized, dystolic dysfunction   Chronic lower back pain    "have 2 herniated discs; going to have to have a fusion" (03/20/2012)   CKD (chronic kidney disease), stage III (HCC)    Family history of anesthesia complication    "father has nausea and vomiting"   Gallstones    GERD (gastroesophageal reflux disease)    H/O hiatal hernia    History of kidney stones    History of right mastectomy    Hypertension    "patient states never had HTN, takes Hyzaar for heart    Hypothyroidism    Irritable bowel syndrome    Migraines    "it's been a long time since I've had one" (03/20/2012)   Osteoarthritis    Peripheral neuropathy    PONV (postoperative nausea and vomiting)    Sciatica    Type II diabetes mellitus (HCC)     Past Surgical History:  Procedure Laterality Date   ABDOMINAL HYSTERECTOMY  1993   adenosin cardiolite  07/2005   (+) wall motion abnormality   AXILLARY LYMPH NODE DISSECTION  2003   squamous cell cancer   BACK SURGERY     CARDIAC CATHETERIZATION  ? 2005 / 2007   CHOLECYSTECTOMY N/A 01/24/2018   Procedure: LAPAROSCOPIC CHOLECYSTECTOMY;  Surgeon: Harriette Bouillon, MD;  Location: [facility] OR;  Service: General;  Laterality: N/A;   CT abdomen and pelvis  02/2010   Same   ESOPHAGOGASTRODUODENOSCOPY  2005   GERD   KNEE ARTHROSCOPY  1990's   "right" (03/20/2012)   LUMBAR FUSION N/A 08/22/2014   Procedure: Right L2-3 and right L1-2 transforaminal lumbar interbody fusion with pedicle screws, rods, sleeves, cages, local bone graft, Vivigen, cancellous chips;  Surgeon: Kerrin Champagne, MD;  Location: [facility] OR;  Service: Orthopedics;  Laterality: N/A;   LUMBAR LAMINECTOMY/DECOMPRESSION MICRODISCECTOMY N/A 02/04/2014   Procedure: RIGHT L2-3 MICRODISCECTOMY ;  Surgeon: Kerrin Champagne, MD;  Location: [facility] OR;  Service: Orthopedics;  Laterality: N/A;   MASTECTOMY Right  MASTECTOMY WITH AXILLARY LYMPH NODE DISSECTION  12/2003   "right" (03/20/2012)   POSTERIOR CERVICAL FUSION/FORAMINOTOMY  2001   SHOULDER ARTHROSCOPY W/ ROTATOR CUFF REPAIR  2003   Left   THYROIDECTOMY  1977   Right   TOTAL KNEE ARTHROPLASTY  2000   Right   [place] of abdomen  07/2005   Fatty liver / kidney stones    Social History [name]  reports that she has never smoked. She has never used smokeless tobacco. She reports that she does not drink alcohol and does not use drugs.  family history includes Breast cancer in her maternal aunt; Breast cancer (age of onset: 6) in her  mother; Deep vein thrombosis in her daughter; Diabetes in her brother, brother, father, and another family member; Hypertension in her brother, brother, father, mother, sister, and another family member.  Allergies  Allergen Reactions   Dilaudid [Hydromorphone Hcl] Nausea And Vomiting   Lipitor [Atorvastatin] Other (See Comments)    Body & Muscle Aches   Adhesive [Tape] Rash   Ampicillin Rash and Other (See Comments)    Also developed welts on her hands when she took it in the hospital   Latex Itching, Dermatitis, Rash and Other (See Comments)    Patient had a reaction to tape after surgery and they told her she was allergic to Latex    Penicillins Rash            PHYSICAL EXAMINATION: Vital signs: BP 128/70   Pulse 70   Ht 5\' 4"  (1.626 m)   Wt 177 lb (80.3 kg)   SpO2 95%   BMI 30.38 kg/m   Constitutional: generally well-appearing, no acute distress Psychiatric: alert and oriented x3, cooperative Eyes: extraocular movements intact, anicteric, conjunctiva pink Mouth: oral pharynx moist, no lesions Neck: supple no lymphadenopathy Cardiovascular: heart regular rate and rhythm, no murmur Lungs: clear to auscultation bilaterally Abdomen: soft, nontender, nondistended, no obvious ascites, no peritoneal signs, normal bowel sounds, no organomegaly Rectal: Deferred to colonoscopy Extremities: no clubbing, cyanosis, or lower extremity edema bilaterally Skin: no lesions on visible extremities Neuro: No focal deficits. No asterixis.    ASSESSMENT:  1.  Positive Cologuard testing 2.  Colonoscopy 2011 with diverticulosis 3.  GERD.  Normal EGD 2021 4.  Multiple medical problems including insulin requiring diabetes   PLAN:  1.  Schedule colonoscopy to evaluate positive Cologuard testing and rule out neoplasia.  Patient is higher than baseline risk given her age and comorbidities.  Also, the need to adjust diabetic medications preprocedure.The nature of the procedure, as well as  the risks, benefits, and alternatives were carefully and thoroughly reviewed with the patient. Ample time for discussion and questions allowed. The patient understood, was satisfied, and agreed to proceed. 2.  Adjust diabetic medications preprocedure 3.  Reflux precautions 4.  Continue PPI 5.  Ongoing general medical care with PCP

## 2022-10-03 NOTE — Patient Instructions (Signed)
You have been scheduled for a colonoscopy. Please follow written instructions given to you at your visit today.  Please pick up your prep supplies at the pharmacy within the next 1-3 days. If you use inhalers (even only as needed), please bring them with you on the day of your procedure.  _______________________________________________________  If your blood pressure at your visit was 140/90 or greater, please contact your primary care physician to follow up on this.  _______________________________________________________  If you are age 74 or older, your body mass index should be between 23-30. Your Body mass index is 30.38 kg/m. If this is out of the aforementioned range listed, please consider follow up with your Primary Care Provider.  If you are age 53 or younger, your body mass index should be between 19-25. Your Body mass index is 30.38 kg/m. If this is out of the aformentioned range listed, please consider follow up with your Primary Care Provider.   ________________________________________________________  The [facility] providers would like to encourage you to use [facility] to communicate with providers for non-urgent requests or questions.  Due to long hold times on the telephone, sending your provider a message by [facility] may be a faster and more efficient way to get a response.  Please allow 48 business hours for a response.  Please remember that this is for non-urgent requests.  _______________________________________________________

## 2022-10-04 ENCOUNTER — Encounter (HOSPITAL_COMMUNITY)
Admission: RE | Admit: 2022-10-04 | Discharge: 2022-10-04 | Disposition: A | Discharge status: Home / Self Care | Payer: Medicare HMO | Source: Ambulatory Visit | Attending: General Surgery | Admitting: General Surgery

## 2022-10-04 ENCOUNTER — Other Ambulatory Visit: Payer: Self-pay

## 2022-10-04 ENCOUNTER — Encounter (HOSPITAL_COMMUNITY): Payer: Self-pay

## 2022-10-04 VITALS — BP 156/67 | HR 64 | Temp 98.4°F | Resp 18 | Ht 64.0 in | Wt 177.0 lb

## 2022-10-04 DIAGNOSIS — Z6830 Body mass index (BMI) 30.0-30.9, adult: Secondary | ICD-10-CM | POA: Diagnosis not present

## 2022-10-04 DIAGNOSIS — K449 Diaphragmatic hernia without obstruction or gangrene: Secondary | ICD-10-CM | POA: Insufficient documentation

## 2022-10-04 DIAGNOSIS — E1122 Type 2 diabetes mellitus with diabetic chronic kidney disease: Secondary | ICD-10-CM | POA: Insufficient documentation

## 2022-10-04 DIAGNOSIS — E1159 Type 2 diabetes mellitus with other circulatory complications: Secondary | ICD-10-CM | POA: Diagnosis not present

## 2022-10-04 DIAGNOSIS — I130 Hypertensive heart and chronic kidney disease with heart failure and stage 1 through stage 4 chronic kidney disease, or unspecified chronic kidney disease: Secondary | ICD-10-CM | POA: Diagnosis not present

## 2022-10-04 DIAGNOSIS — E039 Hypothyroidism, unspecified: Secondary | ICD-10-CM | POA: Insufficient documentation

## 2022-10-04 DIAGNOSIS — G8929 Other chronic pain: Secondary | ICD-10-CM | POA: Diagnosis not present

## 2022-10-04 DIAGNOSIS — M549 Dorsalgia, unspecified: Secondary | ICD-10-CM | POA: Insufficient documentation

## 2022-10-04 DIAGNOSIS — N183 Chronic kidney disease, stage 3 unspecified: Secondary | ICD-10-CM | POA: Diagnosis not present

## 2022-10-04 DIAGNOSIS — E669 Obesity, unspecified: Secondary | ICD-10-CM | POA: Diagnosis not present

## 2022-10-04 DIAGNOSIS — Z01812 Encounter for preprocedural laboratory examination: Secondary | ICD-10-CM | POA: Diagnosis not present

## 2022-10-04 DIAGNOSIS — Z01818 Encounter for other preprocedural examination: Secondary | ICD-10-CM

## 2022-10-04 HISTORY — DX: Bradycardia, unspecified: R00.1

## 2022-10-04 HISTORY — DX: Sleep apnea, unspecified: G47.30

## 2022-10-04 LAB — BASIC METABOLIC PANEL
Anion gap: 8 (ref 5–15)
BUN: 27 mg/dL — ABNORMAL HIGH (ref 8–23)
CO2: 20 mmol/L — ABNORMAL LOW (ref 22–32)
Calcium: 10.2 mg/dL (ref 8.9–10.3)
Chloride: 107 mmol/L (ref 98–111)
Creatinine, Ser: 1.96 mg/dL — ABNORMAL HIGH (ref 0.44–1.00)
GFR, Estimated: 26 mL/min — ABNORMAL LOW (ref >60)
Glucose, Bld: 233 mg/dL — ABNORMAL HIGH (ref 70–99)
Potassium: 4.3 mmol/L (ref 3.5–5.1)
Sodium: 135 mmol/L (ref 135–145)

## 2022-10-04 LAB — CBC
HCT: 38.6 % (ref 36.0–46.0)
Hemoglobin: 12.1 g/dL (ref 12.0–15.0)
MCH: 28.8 pg (ref 26.0–34.0)
MCHC: 31.3 g/dL (ref 30.0–36.0)
MCV: 91.9 fL (ref 80.0–100.0)
Platelets: 292 10*3/uL (ref 150–400)
RBC: 4.2 MIL/uL (ref 3.87–5.11)
RDW: 14.1 % (ref 11.5–15.5)
WBC: 8.5 10*3/uL (ref 4.0–10.5)
nRBC: 0 % (ref 0.0–0.2)

## 2022-10-04 LAB — GLUCOSE, CAPILLARY: Glucose-Capillary: 238 mg/dL — ABNORMAL HIGH (ref 70–99)

## 2022-10-04 LAB — HEMOGLOBIN A1C
Hgb A1c MFr Bld: 7.2 % — ABNORMAL HIGH (ref 4.8–5.6)
Mean Plasma Glucose: 159.94 mg/dL

## 2022-10-04 NOTE — Progress Notes (Signed)
PCP - Dr. Neena Rhymes Cardiologist - Dr. Olga Millers  PPM/ICD - denies   Chest x-ray - 07/06/20 EKG - 09/02/22 Stress Test - 09/08/22 ECHO - 07/21/22 Cardiac Cath - 2005/2007  Sleep Study - mild OSA+ sleep study 06/05/19 CPAP - denies  Fasting Blood Sugar - 90-120 Checks Blood Sugar continuously (continuous sensor)  Last dose of GLP1 agonist-  n/a   Blood Thinner Instructions: Aspirin Instructions: Hold 5-7 days/ Last dose 6/24  ERAS Protcol - yes PRE-SURGERY G2- given at PAT  COVID TEST- n/a   Anesthesia review: yes, cardiac hx  Patient denies shortness of breath, fever, cough and chest pain at PAT appointment   All instructions explained to the patient, with a verbal understanding of the material. Patient agrees to go over the instructions while at home for a better understanding.  The opportunity to ask questions was provided.

## 2022-10-05 NOTE — Progress Notes (Signed)
Choose an anesthesia record to view details        DISCUSSION: [name] is a 72 year old female who presents to PAT prior to left inferior parathyroidectomy with Dr. Derrell Lolling on 10/11/2022.  Past medical history significant for hypertension, CHF (EF 60-65%), CKD, TAA (measuring 38mm), GERD, hiatal hernia, insulin-dependent diabetes, hypothyroidism, obesity, chronic neck pain s/p C4-C5 fusion, chronic back pain s/p lumbar fusion (2014, 2016), s/p thoracic spinal cord stimulator (2022)  No prior anesthesia complications  Patient was admitted on 07/20/2022 to 07/22/2022 for a syncopal episode.  Cardiology was consulted during that admission and syncope sounded more vasovagal than related to an arrhythmia.  Echo was obtained which showed normal LVEF of 60 to 65% with no significant valvular abnormality.  Blood pressure medicines were adjusted and Holter monitoring was recommended.  Patient followed up with cardiology as an outpatient on 09/02/2022.  There was no significant pauses, severe bradycardia or tachycardia noted on monitor to explain her syncope. EKG in the office was noted be sinus bradycardia with HR 44 and dizziness was thought to be orthostasis due to positional nature. She also reported substernal chest tightness with exertion and so a NM stress test was ordered which was low risk.   Per Azalee Course PA-C: "Normal nuclear stress test, no sign of significant reversible blockage or any sign of previous scar. Patient is cleared to proceed with thyroid surgery from cardiac perspective. "  VS: BP (!) 156/67   Pulse 64   Temp 36.9 C   Resp 18   Ht 5\' 4"  (1.626 m)   Wt 80.3 kg   SpO2 96%   BMI 30.38 kg/m   PROVIDERS: Sheliah Hatch, MD Cardiology: Olga Millers, MD Endocrine: Ronny Bacon, NP Nephrology: Dr. Malen Gauze  LABS: Labs reviewed: Acceptable for surgery. Pt with known CKD which is around baseline. (all labs ordered are listed, but only abnormal results are  displayed)  Labs Reviewed  HEMOGLOBIN A1C - Abnormal; Notable for the following components:      Result Value   Hgb A1c MFr Bld 7.2 (*)    All other components within normal limits  BASIC METABOLIC PANEL - Abnormal; Notable for the following components:   CO2 20 (*)    Glucose, Bld 233 (*)    BUN 27 (*)    Creatinine, Ser 1.96 (*)    GFR, Estimated 26 (*)    All other components within normal limits  GLUCOSE, CAPILLARY - Abnormal; Notable for the following components:   Glucose-Capillary 238 (*)    All other components within normal limits  CBC     IMAGES:  CT Head 07/20/22:  IMPRESSION: 1. No acute intracranial abnormality. 2. Generalized cerebral atrophy and microvascular disease changes of the supratentorial brain.  CT C-spine 07/20/22:  IMPRESSION: 1. No acute fracture or subluxation in the cervical spine. 2. Marked severity multilevel degenerative changes, as described above. 3. Prior surgical fusion of the C4-C5 level.  NM Parathyroid 06/29/22:  IMPRESSION: Focal abnormal sestamibi retention suspicious for a LEFT inferior parathyroid adenoma.   Prior RIGHT thyroid lobectomy.  CT Chest 06/23/21:  IMPRESSION: No change in the maximal diameter of the ascending aorta, 4.1 cm. Recommend annual imaging followup by CTA or MRA. This recommendation follows 2010 ACCF/AHA/AATS/ACR/ASA/SCA/SCAI/SIR/STS/SVM Guidelines for the Diagnosis and Management of Patients with Thoracic Aortic Disease. Circulation. 2010; 121: H086-V784. Aortic aneurysm NOS (ICD10-I71.9)   Aortic atherosclerotic calcification.   Newly seen patchy density within the inferior left lower lobe which could represent mild left lower  lobe pneumonia or residual scarring from previous interval pneumonia. Is the patient currently symptomatic?     EKG 09/02/22:  bradycardia, heart rate 44 bpm, first-degree AV block.    CV:  NM Stress Test 09/08/22:    The study is normal. The study is low  risk.   No ST deviation was noted.   LV perfusion is normal. There is no evidence of ischemia. There is no evidence of infarction.   Left ventricular function is normal. End diastolic cavity size is normal. End systolic cavity size is normal.  Echo 07/21/22:  IMPRESSIONS     1. Left ventricular ejection fraction, by estimation, is 60 to 65%. The  left ventricle has normal function. The left ventricle has no regional  wall motion abnormalities. There is mild concentric left ventricular  hypertrophy. Left ventricular diastolic  parameters are consistent with Grade I diastolic dysfunction (impaired  relaxation).   2. Right ventricular systolic function is normal. The right ventricular  size is mildly enlarged.   3. The mitral valve is degenerative. Trivial mitral valve regurgitation.  No evidence of mitral stenosis.   4. The aortic valve is normal in structure. Aortic valve regurgitation is  trivial. No aortic stenosis is present.   5. Aortic dilatation noted. There is mild dilatation of the ascending  aorta, measuring 38 mm.   6. The inferior vena cava is normal in size with greater than 50%  respiratory variability, suggesting right atrial pressure of 3 mmHg.   Past Medical History:  Diagnosis Date   Anginal pain (HCC) 03/19/2012   saw Dr. Melburn Popper .. she thinks its more related to stomach issues   Arthritis    "all over; mostly in my back" (03/20/2012)   Breast cancer (HCC)    "right" (03/20/2012)   Cardiomyopathy    Chest pain 06/2005   Hospitalized, dystolic dysfunction   Chronic lower back pain    "have 2 herniated discs; going to have to have a fusion" (03/20/2012)   CKD (chronic kidney disease), stage III (HCC)    Family history of anesthesia complication    "father has nausea and vomiting"   Gallstones    GERD (gastroesophageal reflux disease)    H/O hiatal hernia    History of kidney stones    History of right mastectomy    Hypertension    "patient states never  had HTN, takes Hyzaar for heart   Hypothyroidism    Irritable bowel syndrome    Migraines    "it's been a long time since I've had one" (03/20/2012)   Osteoarthritis    Peripheral neuropathy    PONV (postoperative nausea and vomiting)    Sciatica    Sinus bradycardia    Sleep apnea    mild, no CPAP   Type II diabetes mellitus (HCC)     Past Surgical History:  Procedure Laterality Date   ABDOMINAL HYSTERECTOMY  1993   adenosin cardiolite  07/2005   (+) wall motion abnormality   AXILLARY LYMPH NODE DISSECTION  2003   squamous cell cancer   BACK SURGERY     CARDIAC CATHETERIZATION  ? 2005 / 2007   CHOLECYSTECTOMY N/A 01/24/2018   Procedure: LAPAROSCOPIC CHOLECYSTECTOMY;  Surgeon: Harriette Bouillon, MD;  Location: [facility] OR;  Service: General;  Laterality: N/A;   CT abdomen and pelvis  02/2010   Same   ESOPHAGOGASTRODUODENOSCOPY  2005   GERD   KNEE ARTHROSCOPY  1990's   "right" (03/20/2012)   LUMBAR FUSION N/A 08/22/2014  Procedure: Right L2-3 and right L1-2 transforaminal lumbar interbody fusion with pedicle screws, rods, sleeves, cages, local bone graft, Vivigen, cancellous chips;  Surgeon: Kerrin Champagne, MD;  Location: [facility] OR;  Service: Orthopedics;  Laterality: N/A;   LUMBAR LAMINECTOMY/DECOMPRESSION MICRODISCECTOMY N/A 02/04/2014   Procedure: RIGHT L2-3 MICRODISCECTOMY ;  Surgeon: Kerrin Champagne, MD;  Location: [facility] OR;  Service: Orthopedics;  Laterality: N/A;   MASTECTOMY WITH AXILLARY LYMPH NODE DISSECTION  12/2003   "right" (03/20/2012)   POSTERIOR CERVICAL FUSION/FORAMINOTOMY  2001   SHOULDER ARTHROSCOPY W/ ROTATOR CUFF REPAIR  2003   Left   THYROIDECTOMY  1977   Right   TOTAL KNEE ARTHROPLASTY  2000   Right   [place] of abdomen  07/2005   Fatty liver / kidney stones    MEDICATIONS:  acetaminophen (TYLENOL) 500 MG tablet   Alcohol Swabs (B-D SINGLE USE SWABS REGULAR) PADS   amLODipine (NORVASC) 10 MG tablet   aspirin 81 MG tablet   Blood Glucose Monitoring Suppl (TRUE  METRIX AIR GLUCOSE METER) w/Device KIT   cetirizine (ZYRTEC) 10 MG tablet   Cholecalciferol (VITAMIN D) 50 MCG (2000 UT) tablet   cyanocobalamin 2000 MCG tablet   dapagliflozin propanediol (FARXIGA) 10 MG TABS tablet   famotidine (PEPCID) 40 MG tablet   glucose blood (TRUE METRIX BLOOD GLUCOSE TEST) test strip   hydrALAZINE (APRESOLINE) 50 MG tablet   insulin NPH-regular Human (NOVOLIN 70/30) (70-30) 100 UNIT/ML injection   levothyroxine (SYNTHROID) 100 MCG tablet   losartan (COZAAR) 25 MG tablet   metoCLOPramide (REGLAN) 10 MG tablet   Omega-3 Fatty Acids (OMEGA 3 PO)   pantoprazole (PROTONIX) 40 MG tablet   Polyethyl Glycol-Propyl Glycol (SYSTANE ULTRA) 0.4-0.3 % SOLN   polyethylene glycol (MIRALAX / GLYCOLAX) packet   rosuvastatin (CRESTOR) 20 MG tablet   SUMAtriptan (IMITREX) 50 MG tablet   torsemide (DEMADEX) 20 MG tablet   TRUEplus Lancets 33G MISC   Vitamin D, Ergocalciferol, (DRISDOL) 1.25 MG (50000 UNIT) CAPS capsule   No current facility-administered medications for this encounter.   Marcille Blanco [facility] Surgical Short Stay/Anesthesiology [facility] Phone [phone] 10/05/2022 1:27 PM

## 2022-10-05 NOTE — Anesthesia Preprocedure Evaluation (Addendum)
Anesthesia Evaluation  Patient identified by MRN, date of birth, ID band Patient awake    Reviewed: Allergy & Precautions, NPO status , Patient's Chart, lab work & pertinent test results  History of Anesthesia Complications (+) PONV and history of anesthetic complications  Airway Mallampati: II  TM Distance: >3 FB Neck ROM: Full    Dental no notable dental hx.    Pulmonary sleep apnea    Pulmonary exam normal        Cardiovascular hypertension, Pt. on medications Normal cardiovascular exam     Neuro/Psych  Headaches   Depression       GI/Hepatic Neg liver ROS, hiatal hernia,GERD  Medicated,,  Endo/Other  diabetes, Type 2, Insulin DependentHypothyroidism  PRIMARY HYPERPARATHYROIDISM  Renal/GU Renal InsufficiencyRenal disease     Musculoskeletal  (+) Arthritis ,    Abdominal   Peds  Hematology negative hematology ROS (+)   Anesthesia Other Findings Day of surgery medications reviewed with patient.  Reproductive/Obstetrics                              Anesthesia Physical Anesthesia Plan  ASA: 2  Anesthesia Plan: General   Post-op Pain Management: Tylenol PO (pre-op)*   Induction: Intravenous  PONV Risk Score and Plan: 4 or greater and Propofol infusion, TIVA, Dexamethasone, Ondansetron and Treatment may vary due to age or medical condition  Airway Management Planned: Oral ETT  Additional Equipment: None  Intra-op Plan:   Post-operative Plan: Extubation in OR  Informed Consent: I have reviewed the patients History and Physical, chart, labs and discussed the procedure including the risks, benefits and alternatives for the proposed anesthesia with the patient or authorized representative who has indicated his/her understanding and acceptance.     Dental advisory given  Plan Discussed with: CRNA  Anesthesia Plan Comments: (See PAT note from 6/25 by Sherlie Ban PA-C )          Anesthesia Quick Evaluation

## 2022-10-10 NOTE — H&P (Signed)
Chief Complaint: New Consultation ( Right thyroid lobectomy)   History of Present Illness: [name] is a 71 y.o. female who is seen today as an office consultation for evaluation of New Consultation ( Right thyroid lobectomy)   Patient is a 71-year-old female who comes in secondary to elevated PTH and calcium labs. Patient states that she was found to have elevated calciums while visiting with her kidney doctor.  Patient has stage IV end-stage renal disease.  She states that she is continues to make urine.  She is currently not on dialysis.  Patient underwent PTH which revealed an elevated PTH of 110.  Patient subsequently underwent sestamibi scan which revealed a left inferior parathyroid adenoma.  Patient was referred for further evaluation and management.  Patient denies any recurrent kidney stones, fractures, cramps or GI malfunction.    Review of Systems: A complete review of systems was obtained from the patient.  I have reviewed this information and discussed as appropriate with the patient.  See HPI as well for other ROS.  Review of Systems  Constitutional:  Negative for fever.  HENT:  Negative for congestion.   Eyes:  Negative for blurred vision.  Respiratory:  Negative for cough, shortness of breath and wheezing.   Cardiovascular:  Negative for chest pain and palpitations.  Gastrointestinal:  Negative for heartburn.  Genitourinary:  Negative for dysuria.  Musculoskeletal:  Negative for myalgias.  Skin:  Negative for rash.  Neurological:  Negative for dizziness and headaches.  Psychiatric/Behavioral:  Negative for depression and suicidal ideas.   All other systems reviewed and are negative.    Medical History: Past Medical History: Diagnosis Date  Arthritis   Chronic kidney disease   GERD (gastroesophageal reflux disease)   History of cancer   Thyroid disease    There is no problem list on file for this patient.   Past Surgical  History: Procedure Laterality Date  Back Surgery    Bladder Surgery    HYSTERECTOMY    Knee Surgery    Thyroid Surgery      Allergies Allergen Reactions  Penicillin Anaphylaxis and Rash  Adhesive Rash  Ampicillin Rash and Other (See Comments)   Developed welts on her hands   Dilaudid [Hydromorphone] Nausea and Vomiting  Latex Rash and Other (See Comments)   Dermatitis  Lipitor [Atorvastatin] Other (See Comments)   Body and muscle aches   Current Outpatient Medications on File Prior to Visit Medication Sig Dispense Refill  cholecalciferol (VITAMIN D3) 2,000 unit tablet Take 2,000 Units by mouth once daily    dapagliflozin propanediol (FARXIGA) 10 mg tablet Take by mouth once daily    insulin NPH-REGULAR (NOVOLIN 70/30 U-100 INSULIN) 100 unit/mL (70-30) injection INJECT 45 UNITS INTO THE SKIN 2 TIMES DAILY WITH A MEAL    levothyroxine (SYNTHROID) 100 MCG tablet Take 100 mcg by mouth every morning before breakfast (0630)    No current facility-administered medications on file prior to visit.   Family History Problem Relation Age of Onset  Diabetes Mother   Breast cancer Mother   Obesity Brother   Diabetes Brother     Social History  Tobacco Use Smoking Status Never Smokeless Tobacco Never    Social History  Socioeconomic History  Marital status: Married Tobacco Use  Smoking status: Never  Smokeless tobacco: Never Vaping Use  Vaping status: Never Used Substance and Sexual Activity  Alcohol use: Never  Drug use: Never  Social Determinants of Health  Financial Resource Strain: Medium Risk (12/23/2021)  Received   from [place]  Overall Financial Resource Strain (CARDIA)   Difficulty of Paying Living Expenses: Somewhat hard Food Insecurity: No Food Insecurity (07/21/2022)  Received from [place]  Hunger Vital Sign   Worried About Running Out of Food in the Last Year: Never true   Ran Out of Food in the Last Year: Never true Transportation Needs: No  Transportation Needs (07/21/2022)  Received from [place]  PRAPARE - Transportation   Lack of Transportation (Medical): No   Lack of Transportation (Non-Medical): No Physical Activity: Insufficiently Active (12/23/2021)  Received from [place]  Exercise Vital Sign   Days of Exercise per Week: 3 days   Minutes of Exercise per Session: 30 min Stress: No Stress Concern Present (12/23/2021)  Received from [place]  [employer] - Occupational Stress Questionnaire   Feeling of Stress : Not at all Social Connections: Moderately Integrated (12/23/2021)  Received from [place]  Social Connection and Isolation Panel [NHANES]   Frequency of Communication with Friends and Family: More than three times a week   Frequency of Social Gatherings with Friends and Family: More than three times a week   Attends Religious Services: More than 4 times per year   Active Member of Clubs or Organizations: No   Attends Club or Organization Meetings: Never   Marital Status: Married   Objective:  Vitals:  08/24/22 1125 BP: 129/84 Pulse: 74 Temp: 36.5 C (97.7 F) SpO2: 97% Weight: 82.6 kg (182 lb) Height: 162.6 cm (5' 4") PainSc:   6   Body mass index is 31.24 kg/m.  Physical Exam Constitutional:      Appearance: Normal appearance.  HENT:     Head: Normocephalic and atraumatic.     Mouth/Throat:     Mouth: Mucous membranes are moist.     Pharynx: Oropharynx is clear.  Eyes:     General: No scleral icterus.    Pupils: Pupils are equal, round, and reactive to light.  Cardiovascular:     Rate and Rhythm: Normal rate and regular rhythm.     Pulses: Normal pulses.     Heart sounds: No murmur heard.    No friction rub. No gallop.  Pulmonary:     Effort: Pulmonary effort is normal. No respiratory distress.     Breath sounds: Normal breath sounds. No stridor.  Abdominal:     General: Abdomen is flat.  Musculoskeletal:        General: No swelling.   Skin:    General: Skin is warm.  Neurological:     General: No focal deficit present.     Mental Status: She is alert and oriented to person, place, and time. Mental status is at baseline.  Psychiatric:        Mood and Affect: Mood normal.        Thought Content: Thought content normal.        Judgment: Judgment normal.     Labs, Imaging and Diagnostic Testing: Sestamibi scan and laboratory studies were reviewed per medical record.  Assessment and Plan:    Diagnoses and all orders for this visit:  Primary hyperparathyroidism (CMS/HHS-HCC)    1.  At this point patient be accounted for a left inferior parathyroidectomy secondary to primary hyperparathyroidism. Will have the patient evaluated by cardiology secondary to history of syncope.  She has a follow-up appoint with him on May 24.  Once patient is cleared from surgery we will proceed to the operating room for a left inferior   middle invasive parathyroidectomy.  2.  Discussed with patient the risk benefits of procedure to include elliptical infection, bleeding, damage surrounding structures, possible need for further surgery.  Patient voiced understanding wished to proceed.    Lilyrose Tanney, MD  

## 2022-10-11 ENCOUNTER — Encounter (HOSPITAL_COMMUNITY)
Admission: RE | Disposition: A | Discharge status: Home / Self Care | Payer: Self-pay | Source: Home / Self Care | Attending: General Surgery

## 2022-10-11 ENCOUNTER — Encounter (HOSPITAL_COMMUNITY): Payer: Self-pay | Admitting: General Surgery

## 2022-10-11 ENCOUNTER — Ambulatory Visit (HOSPITAL_COMMUNITY): Payer: Medicare HMO | Admitting: Medical

## 2022-10-11 ENCOUNTER — Other Ambulatory Visit: Payer: Self-pay

## 2022-10-11 ENCOUNTER — Ambulatory Visit (HOSPITAL_BASED_OUTPATIENT_CLINIC_OR_DEPARTMENT_OTHER): Payer: Medicare HMO | Admitting: Certified Registered Nurse Anesthetist

## 2022-10-11 ENCOUNTER — Observation Stay (HOSPITAL_COMMUNITY)
Admission: RE | Admit: 2022-10-11 | Discharge: 2022-10-12 | Disposition: A | Discharge status: Home / Self Care | Payer: Medicare HMO | Attending: General Surgery | Admitting: General Surgery

## 2022-10-11 DIAGNOSIS — Z9889 Other specified postprocedural states: Secondary | ICD-10-CM

## 2022-10-11 DIAGNOSIS — E213 Hyperparathyroidism, unspecified: Secondary | ICD-10-CM | POA: Diagnosis not present

## 2022-10-11 DIAGNOSIS — Z91040 Latex allergy status: Secondary | ICD-10-CM | POA: Diagnosis not present

## 2022-10-11 DIAGNOSIS — Z859 Personal history of malignant neoplasm, unspecified: Secondary | ICD-10-CM | POA: Insufficient documentation

## 2022-10-11 DIAGNOSIS — E210 Primary hyperparathyroidism: Secondary | ICD-10-CM

## 2022-10-11 DIAGNOSIS — N189 Chronic kidney disease, unspecified: Secondary | ICD-10-CM | POA: Insufficient documentation

## 2022-10-11 DIAGNOSIS — Z79899 Other long term (current) drug therapy: Secondary | ICD-10-CM | POA: Insufficient documentation

## 2022-10-11 DIAGNOSIS — E1159 Type 2 diabetes mellitus with other circulatory complications: Secondary | ICD-10-CM

## 2022-10-11 DIAGNOSIS — Z9089 Acquired absence of other organs: Secondary | ICD-10-CM

## 2022-10-11 DIAGNOSIS — D351 Benign neoplasm of parathyroid gland: Secondary | ICD-10-CM | POA: Diagnosis not present

## 2022-10-11 HISTORY — PX: PARATHYROIDECTOMY: SHX19

## 2022-10-11 LAB — GLUCOSE, CAPILLARY
Glucose-Capillary: 140 mg/dL — ABNORMAL HIGH (ref 70–99)
Glucose-Capillary: 146 mg/dL — ABNORMAL HIGH (ref 70–99)
Glucose-Capillary: 124 mg/dL — ABNORMAL HIGH (ref 70–99)
Glucose-Capillary: 163 mg/dL — ABNORMAL HIGH (ref 70–99)

## 2022-10-11 SURGERY — PARATHYROIDECTOMY
Anesthesia: General | Site: Neck | Laterality: Left

## 2022-10-11 MED ORDER — POLYETHYL GLYCOL-PROPYL GLYCOL 0.4-0.3 % OP SOLN: 1.0000 [drp] | Freq: Every day | OPHTHALMIC | Status: DC | PRN

## 2022-10-11 MED ORDER — LEVOTHYROXINE SODIUM 100 MCG PO TABS
100.0000 ug | ORAL_TABLET | Freq: Every day | ORAL | Status: DC
  Administered 2022-10-12: 100 ug via ORAL
  Filled 2022-10-11: qty 1

## 2022-10-11 MED ORDER — BUPIVACAINE HCL (PF) 0.25 % IJ SOLN
INTRAMUSCULAR | Status: AC
  Filled 2022-10-11: qty 30

## 2022-10-11 MED ORDER — ACETAMINOPHEN 500 MG PO TABS
ORAL_TABLET | ORAL | Status: AC
  Administered 2022-10-11: 1000 mg via ORAL
  Filled 2022-10-11: qty 2

## 2022-10-11 MED ORDER — KETOROLAC TROMETHAMINE 15 MG/ML IJ SOLN
15.0000 mg | Freq: Four times a day (QID) | INTRAMUSCULAR | Status: AC
  Administered 2022-10-11: 15 mg via INTRAVENOUS

## 2022-10-11 MED ORDER — HYDRALAZINE HCL 50 MG PO TABS
50.0000 mg | ORAL_TABLET | Freq: Two times a day (BID) | ORAL | Status: DC
  Administered 2022-10-11 – 2022-10-12 (×2): 50 mg via ORAL
  Filled 2022-10-11 (×2): qty 1

## 2022-10-11 MED ORDER — SODIUM CHLORIDE 0.9 % IV SOLN: INTRAVENOUS | Status: DC

## 2022-10-11 MED ORDER — ACETAMINOPHEN 500 MG PO TABS: 1000.0000 mg | ORAL_TABLET | ORAL | Status: AC

## 2022-10-11 MED ORDER — LIDOCAINE 2% (20 MG/ML) 5 ML SYRINGE
INTRAMUSCULAR | Status: AC
  Filled 2022-10-11: qty 5

## 2022-10-11 MED ORDER — FENTANYL CITRATE (PF) 250 MCG/5ML IJ SOLN
INTRAMUSCULAR | Status: DC | PRN
  Administered 2022-10-11 (×2): 50 ug via INTRAVENOUS

## 2022-10-11 MED ORDER — LIDOCAINE 2% (20 MG/ML) 5 ML SYRINGE
INTRAMUSCULAR | Status: DC | PRN
  Administered 2022-10-11: 60 mg via INTRAVENOUS

## 2022-10-11 MED ORDER — BUPIVACAINE HCL (PF) 0.25 % IJ SOLN
INTRAMUSCULAR | Status: DC | PRN
  Administered 2022-10-11: 10 mL

## 2022-10-11 MED ORDER — PROPOFOL 10 MG/ML IV BOLUS
INTRAVENOUS | Status: AC
  Filled 2022-10-11: qty 20

## 2022-10-11 MED ORDER — PROPOFOL 500 MG/50ML IV EMUL
INTRAVENOUS | Status: DC | PRN
  Administered 2022-10-11: 150 ug/kg/min via INTRAVENOUS

## 2022-10-11 MED ORDER — PROPOFOL 10 MG/ML IV BOLUS
INTRAVENOUS | Status: DC | PRN
  Administered 2022-10-11: 120 mg via INTRAVENOUS

## 2022-10-11 MED ORDER — INSULIN ASPART 100 UNIT/ML IJ SOLN: 0.0000 [IU] | INTRAMUSCULAR | Status: DC | PRN

## 2022-10-11 MED ORDER — HEMOSTATIC AGENTS (NO CHARGE) OPTIME
TOPICAL | Status: DC | PRN
  Administered 2022-10-11: 1

## 2022-10-11 MED ORDER — PHENYLEPHRINE HCL-NACL 20-0.9 MG/250ML-% IV SOLN
INTRAVENOUS | Status: DC | PRN
  Administered 2022-10-11: 20 ug/min via INTRAVENOUS

## 2022-10-11 MED ORDER — MAGNESIUM OXIDE -MG SUPPLEMENT 400 (240 MG) MG PO TABS
400.0000 mg | ORAL_TABLET | Freq: Two times a day (BID) | ORAL | Status: DC
  Administered 2022-10-11 – 2022-10-12 (×2): 400 mg via ORAL
  Filled 2022-10-11 (×2): qty 1

## 2022-10-11 MED ORDER — GLYCOPYRROLATE PF 0.2 MG/ML IJ SOSY
PREFILLED_SYRINGE | INTRAMUSCULAR | Status: AC
  Filled 2022-10-11: qty 1

## 2022-10-11 MED ORDER — FAMOTIDINE 20 MG PO TABS: 40.0000 mg | ORAL_TABLET | Freq: Every day | ORAL | Status: DC

## 2022-10-11 MED ORDER — ONDANSETRON 4 MG PO TBDP: 4.0000 mg | ORAL_TABLET | Freq: Four times a day (QID) | ORAL | Status: DC | PRN

## 2022-10-11 MED ORDER — ROCURONIUM BROMIDE 10 MG/ML (PF) SYRINGE
PREFILLED_SYRINGE | INTRAVENOUS | Status: DC | PRN
  Administered 2022-10-11: 20 mg via INTRAVENOUS
  Administered 2022-10-11: 50 mg via INTRAVENOUS

## 2022-10-11 MED ORDER — LOSARTAN POTASSIUM 25 MG PO TABS: 25.0000 mg | ORAL_TABLET | Freq: Every day | ORAL | Status: DC

## 2022-10-11 MED ORDER — POLYETHYLENE GLYCOL 3350 17 G PO PACK: 17.0000 g | PACK | Freq: Every day | ORAL | Status: DC | PRN

## 2022-10-11 MED ORDER — FENTANYL CITRATE (PF) 250 MCG/5ML IJ SOLN
INTRAMUSCULAR | Status: AC
  Filled 2022-10-11: qty 5

## 2022-10-11 MED ORDER — DAPAGLIFLOZIN PROPANEDIOL 10 MG PO TABS: 10.0000 mg | ORAL_TABLET | Freq: Every day | ORAL | Status: DC

## 2022-10-11 MED ORDER — OXYCODONE HCL 5 MG PO TABS
5.0000 mg | ORAL_TABLET | ORAL | Status: DC | PRN
  Administered 2022-10-11: 10 mg via ORAL
  Filled 2022-10-11: qty 2

## 2022-10-11 MED ORDER — KETOROLAC TROMETHAMINE 15 MG/ML IJ SOLN
15.0000 mg | Freq: Four times a day (QID) | INTRAMUSCULAR | Status: DC | PRN
  Filled 2022-10-11: qty 1

## 2022-10-11 MED ORDER — ONDANSETRON HCL 4 MG/2ML IJ SOLN
INTRAMUSCULAR | Status: DC | PRN
  Administered 2022-10-11: 4 mg via INTRAVENOUS

## 2022-10-11 MED ORDER — VANCOMYCIN HCL IN DEXTROSE 1-5 GM/200ML-% IV SOLN
INTRAVENOUS | Status: AC
  Administered 2022-10-11: 1000 mg via INTRAVENOUS
  Filled 2022-10-11: qty 200

## 2022-10-11 MED ORDER — ROCURONIUM BROMIDE 10 MG/ML (PF) SYRINGE
PREFILLED_SYRINGE | INTRAVENOUS | Status: AC
  Filled 2022-10-11: qty 10

## 2022-10-11 MED ORDER — 0.9 % SODIUM CHLORIDE (POUR BTL) OPTIME
TOPICAL | Status: DC | PRN
  Administered 2022-10-11: 1000 mL

## 2022-10-11 MED ORDER — DEXTROSE-SODIUM CHLORIDE 5-0.9 % IV SOLN: INTRAVENOUS | Status: DC

## 2022-10-11 MED ORDER — DEXAMETHASONE SODIUM PHOSPHATE 10 MG/ML IJ SOLN
INTRAMUSCULAR | Status: AC
  Filled 2022-10-11: qty 1

## 2022-10-11 MED ORDER — CHLORHEXIDINE GLUCONATE 0.12 % MT SOLN
OROMUCOSAL | Status: AC
  Administered 2022-10-11: 15 mL via OROMUCOSAL
  Filled 2022-10-11: qty 15

## 2022-10-11 MED ORDER — CHLORHEXIDINE GLUCONATE CLOTH 2 % EX PADS: 6.0000 | MEDICATED_PAD | Freq: Once | CUTANEOUS | Status: DC

## 2022-10-11 MED ORDER — TRAMADOL HCL 50 MG PO TABS: 50.0000 mg | ORAL_TABLET | Freq: Four times a day (QID) | ORAL | Status: DC | PRN

## 2022-10-11 MED ORDER — CHLORHEXIDINE GLUCONATE 0.12 % MT SOLN: 15.0000 mL | Freq: Once | OROMUCOSAL | Status: AC

## 2022-10-11 MED ORDER — ONDANSETRON HCL 4 MG/2ML IJ SOLN
INTRAMUSCULAR | Status: AC
  Filled 2022-10-11: qty 2

## 2022-10-11 MED ORDER — TORSEMIDE 20 MG PO TABS: 20.0000 mg | ORAL_TABLET | Freq: Two times a day (BID) | ORAL | Status: DC | PRN

## 2022-10-11 MED ORDER — INSULIN ASPART 100 UNIT/ML IJ SOLN
0.0000 [IU] | Freq: Three times a day (TID) | INTRAMUSCULAR | Status: DC
  Administered 2022-10-12: 5 [IU] via SUBCUTANEOUS

## 2022-10-11 MED ORDER — LACTATED RINGERS IV SOLN
INTRAVENOUS | Status: DC
Start: 2022-10-11 — End: 2022-10-11

## 2022-10-11 MED ORDER — GLYCOPYRROLATE PF 0.2 MG/ML IJ SOSY
PREFILLED_SYRINGE | INTRAMUSCULAR | Status: DC | PRN
  Administered 2022-10-11: .2 mg via INTRAVENOUS

## 2022-10-11 MED ORDER — ORAL CARE MOUTH RINSE: 15.0000 mL | Freq: Once | OROMUCOSAL | Status: AC

## 2022-10-11 MED ORDER — AMLODIPINE BESYLATE 10 MG PO TABS
10.0000 mg | ORAL_TABLET | Freq: Every day | ORAL | Status: DC
  Administered 2022-10-11 – 2022-10-12 (×2): 10 mg via ORAL
  Filled 2022-10-11 (×2): qty 1

## 2022-10-11 MED ORDER — PANTOPRAZOLE SODIUM 40 MG PO TBEC
40.0000 mg | DELAYED_RELEASE_TABLET | Freq: Two times a day (BID) | ORAL | Status: DC
  Administered 2022-10-11 – 2022-10-12 (×2): 40 mg via ORAL
  Filled 2022-10-11 (×2): qty 1

## 2022-10-11 MED ORDER — SUGAMMADEX SODIUM 200 MG/2ML IV SOLN
INTRAVENOUS | Status: DC | PRN
  Administered 2022-10-11: 160.6 mg via INTRAVENOUS

## 2022-10-11 MED ORDER — DEXAMETHASONE SODIUM PHOSPHATE 10 MG/ML IJ SOLN
INTRAMUSCULAR | Status: DC | PRN
  Administered 2022-10-11: 5 mg via INTRAVENOUS

## 2022-10-11 MED ORDER — ACETAMINOPHEN 500 MG PO TABS
1000.0000 mg | ORAL_TABLET | Freq: Four times a day (QID) | ORAL | Status: DC | PRN
  Administered 2022-10-11: 1000 mg via ORAL
  Filled 2022-10-11 (×2): qty 2

## 2022-10-11 MED ORDER — ONDANSETRON HCL 4 MG/2ML IJ SOLN: 4.0000 mg | Freq: Four times a day (QID) | INTRAMUSCULAR | Status: DC | PRN

## 2022-10-11 MED ORDER — CALCIUM CARBONATE ANTACID 500 MG PO CHEW
800.0000 mg | CHEWABLE_TABLET | Freq: Three times a day (TID) | ORAL | Status: DC
  Administered 2022-10-11 – 2022-10-12 (×2): 800 mg via ORAL
  Filled 2022-10-11 (×2): qty 4

## 2022-10-11 MED ORDER — ENSURE PRE-SURGERY PO LIQD: 296.0000 mL | Freq: Once | ORAL | Status: DC

## 2022-10-11 MED ORDER — ROSUVASTATIN CALCIUM 20 MG PO TABS
20.0000 mg | ORAL_TABLET | Freq: Every day | ORAL | Status: DC
  Administered 2022-10-11: 20 mg via ORAL
  Filled 2022-10-11: qty 1

## 2022-10-11 MED ORDER — VANCOMYCIN HCL IN DEXTROSE 1-5 GM/200ML-% IV SOLN: 1000.0000 mg | INTRAVENOUS | Status: AC

## 2022-10-11 MED ORDER — ASPIRIN 81 MG PO TBEC
81.0000 mg | DELAYED_RELEASE_TABLET | Freq: Every day | ORAL | Status: DC
  Administered 2022-10-12: 81 mg via ORAL
  Filled 2022-10-11: qty 1

## 2022-10-11 SURGICAL SUPPLY — 46 items
ADH SKN CLS APL DERMABOND .7 (GAUZE/BANDAGES/DRESSINGS) ×1
APL PRP STRL LF DISP 70% ISPRP (MISCELLANEOUS) ×1
BAG COUNTER SPONGE SURGICOUNT (BAG) ×2 IMPLANT
BAG SPNG CNTER NS LX DISP (BAG)
CANISTER SUCT 3000ML PPV (MISCELLANEOUS) ×2 IMPLANT
CHLORAPREP W/TINT 26 (MISCELLANEOUS) ×2 IMPLANT
CLIP TI MEDIUM 6 (CLIP) ×2 IMPLANT
CLIP TI WIDE RED SMALL 6 (CLIP) ×2 IMPLANT
CNTNR URN SCR LID CUP LEK RST (MISCELLANEOUS) ×2 IMPLANT
CONT SPEC 4OZ STRL OR WHT (MISCELLANEOUS) ×1
COVER SURGICAL LIGHT HANDLE (MISCELLANEOUS) ×2 IMPLANT
DERMABOND ADVANCED .7 DNX12 (GAUZE/BANDAGES/DRESSINGS) IMPLANT
DISSECTOR SURG LIGASURE 21 (MISCELLANEOUS) IMPLANT
DRAPE LAPAROTOMY 100X72 PEDS (DRAPES) ×2 IMPLANT
DRAPE SLUSH/WARMER DISC (DRAPES) IMPLANT
ELECT COATED BLADE 2.86 ST (ELECTRODE) ×2 IMPLANT
ELECT REM PT RETURN 9FT ADLT (ELECTROSURGICAL) ×1
ELECTRODE REM PT RTRN 9FT ADLT (ELECTROSURGICAL) ×2 IMPLANT
GAUZE 4X4 16PLY ~~LOC~~+RFID DBL (SPONGE) ×2 IMPLANT
GAUZE SPONGE 2X2 8PLY STRL LF (GAUZE/BANDAGES/DRESSINGS) ×2 IMPLANT
GLOVE BIO SURGEON STRL SZ7.5 (GLOVE) ×4 IMPLANT
GLOVE BIOGEL PI IND STRL 8 (GLOVE) IMPLANT
GOWN STRL REUS W/ TWL LRG LVL3 (GOWN DISPOSABLE) ×4 IMPLANT
GOWN STRL REUS W/TWL LRG LVL3 (GOWN DISPOSABLE) ×3
HEMOSTAT SURGICEL 2X4 FIBR (HEMOSTASIS) ×2 IMPLANT
ILLUMINATOR WAVEGUIDE N/F (MISCELLANEOUS) ×2 IMPLANT
KIT BASIN OR (CUSTOM PROCEDURE TRAY) ×2 IMPLANT
KIT TURNOVER KIT B (KITS) ×2 IMPLANT
NDL HYPO 25GX1X1/2 BEV (NEEDLE) IMPLANT
NEEDLE HYPO 25GX1X1/2 BEV (NEEDLE) ×1 IMPLANT
NS IRRIG 1000ML POUR BTL (IV SOLUTION) ×2 IMPLANT
PACK GENERAL/GYN (CUSTOM PROCEDURE TRAY) ×2 IMPLANT
PAD ARMBOARD 7.5X6 YLW CONV (MISCELLANEOUS) ×4 IMPLANT
PENCIL SMOKE EVACUATOR (MISCELLANEOUS) ×2 IMPLANT
SPONGE INTESTINAL PEANUT (DISPOSABLE) ×2 IMPLANT
STRIP CLOSURE SKIN 1/2X4 (GAUZE/BANDAGES/DRESSINGS) ×2 IMPLANT
SUT MNCRL AB 4-0 PS2 18 (SUTURE) ×2 IMPLANT
SUT SILK 2 0 (SUTURE) ×1
SUT SILK 2-0 18XBRD TIE 12 (SUTURE) IMPLANT
SUT SILK 3 0 (SUTURE) ×1
SUT SILK 3-0 18XBRD TIE 12 (SUTURE) IMPLANT
SUT VIC AB 3-0 SH 18 (SUTURE) ×2 IMPLANT
SUT VIC AB 3-0 SH 8-18 (SUTURE) IMPLANT
SYR CONTROL 10ML LL (SYRINGE) IMPLANT
TOWEL GREEN STERILE (TOWEL DISPOSABLE) ×2 IMPLANT
TOWEL GREEN STERILE FF (TOWEL DISPOSABLE) ×2 IMPLANT

## 2022-10-11 NOTE — Op Note (Signed)
10/11/2022  4:39 PM  PATIENT:  [name]  72 y.o. female  PRE-OPERATIVE DIAGNOSIS:  PRIMARY HYPER PARATHYROIDISM  POST-OPERATIVE DIAGNOSIS:  PRIMARY HYPER PARATHYROIDISM  PROCEDURE:  Procedure(s): MINIMALLY INVASIVE LEFT INFERIOR PARATHYROIDECTOMY (Left)  SURGEON:  Surgeon(s) and Role:    * Axel Filler, MD - Primary  ASSISTANTS: Rockwell Germany, RNFA   ANESTHESIA:   local and general  EBL:  minimal   BLOOD ADMINISTERED:none  DRAINS: none   LOCAL MEDICATIONS USED:  BUPIVICAINE   SPECIMEN:  Source of Specimen: Left inferior parathyroid, frozen section revealed hyper cellular parathyroid tissue per pathology  DISPOSITION OF SPECIMEN:  PATHOLOGY  COUNTS:  YES  TOURNIQUET:  * No tourniquets in log *  DICTATION: .Dragon Dictation  Findings: Patient with the left inferior parathyroid that was within the tracheoesophageal groove.  Frozen section did reveal hypercellular parathyroid tissue.   Details of the procedure:  The patient was taken back to the operating room. The patient was placed in supine position with bilateral SCDs in place.  The patient was prepped and draped in the usual sterile fashion. After appropriate anitbiotics were confirmed, a time-out was confirmed and all facts were verified. A left-sided 3 cm incision was made approximately 2 fingerbreadths above the sternal notch. Bovie cautery was used to maintain hemostasis dissection was carried down through the platysma. The platysma was elevated and flaps were created superiorly and inferiorly to the thyroid cartilage as well as the sternal notch, repsectively. The strap muscles were identified in the midline and separated.  Left-sided strap muscles were elevated off the anterior surface of the thyroid. This dissection was carried laterally. We proceeded to dissect away the left thyroid lobe with Kitners from the surrounding musculature from the thyroid.  The middle thyroidal arteries were left in  place.  Upon dissecting posterior to the left lobe of the thyroid, an enlarge parathyroid gland was palpated in the tracheoesophageal groove.  This was dissected out from the surrounding tissue.  Once it was removed it was sent to Pathology for a frozen section for confirmation.  The area was irrigated out. The dissection bed was hemostatic. We placed fibrillar hemostatic agent into the wound. Strap muscles were then reapproximated in the midline with interrupted 3-0 Vicryl stitches. The platysma was reapproximated using 3-0 Vicryl stitches in interrupted fashion. Skin was then reapproximated using a running subcuticular 4-0 Monocryl. The skin was then dressed with Dermabond.   Intraoperatively, Pathology confirmed that the specimen was indeed hypercellular parathyroid tissue.  The patient was taken to the recovery room in stable condition.    PLAN OF CARE: Admit for overnight observation  PATIENT DISPOSITION:  PACU - hemodynamically stable.   Delay start of Pharmacological VTE agent (>24hrs) due to surgical blood loss or risk of bleeding: yes

## 2022-10-11 NOTE — Transfer of Care (Signed)
Immediate Anesthesia Transfer of Care Note  [name]  Procedure(s) Performed: MINIMALLY INVASIVE LEFT INFERIOR PARATHYROIDECTOMY (Left: Neck)  Patient Location: PACU  Anesthesia Type:General  Level of Consciousness: awake, drowsy, patient cooperative, and responds to stimulation  Airway & Oxygen Therapy: Patient Spontanous Breathing and Patient connected to nasal cannula oxygen  Post-op Assessment: Report given to RN, Post -op Vital signs reviewed and stable, Patient moving all extremities X 4, and Patient able to stick tongue midline  Post vital signs: Reviewed  Last Vitals:  Vitals Value Taken Time  BP 152/70 10/11/22 1701  Temp 97.9   Pulse 52 10/11/22 1703  Resp 10 10/11/22 1703  SpO2 92 % 10/11/22 1703  Vitals shown include unvalidated device data.  Last Pain:  Vitals:   10/11/22 1318  TempSrc:   PainSc: 0-No pain         Complications: No notable events documented.

## 2022-10-11 NOTE — Anesthesia Postprocedure Evaluation (Signed)
Anesthesia Post Note  [name]  Procedure(s) Performed: MINIMALLY INVASIVE LEFT INFERIOR PARATHYROIDECTOMY (Left: Neck)     Patient location during evaluation: PACU Anesthesia Type: General Level of consciousness: awake and alert Pain management: pain level controlled Vital Signs Assessment: post-procedure vital signs reviewed and stable Respiratory status: spontaneous breathing, nonlabored ventilation, respiratory function stable and patient connected to nasal cannula oxygen Cardiovascular status: blood pressure returned to baseline and stable Postop Assessment: no apparent nausea or vomiting Anesthetic complications: no  No notable events documented.  Last Vitals:  Vitals:   10/11/22 1715 10/11/22 1730  BP: (!) 161/79 (!) 144/67  Pulse: (!) 55 64  Resp: 16 16  Temp:    SpO2: 95% 98%    Last Pain:  Vitals:   10/11/22 1700  TempSrc:   PainSc: Asleep                 Anum Palecek,W. EDMOND

## 2022-10-11 NOTE — Anesthesia Procedure Notes (Signed)
Procedure Name: Intubation Date/Time: 10/11/2022 3:30 PM  Performed by: Cy Blamer, CRNAPre-anesthesia Checklist: Patient identified, Emergency Drugs available, Suction available and Patient being monitored Patient Re-evaluated:Patient Re-evaluated prior to induction Oxygen Delivery Method: Circle system utilized Preoxygenation: Pre-oxygenation with 100% oxygen Induction Type: IV induction Ventilation: Mask ventilation without difficulty Laryngoscope Size: Miller and 2 Grade View: Grade I Tube type: Oral Tube size: 7.0 mm Number of attempts: 1 Airway Equipment and Method: Stylet and Bite block Placement Confirmation: ETT inserted through vocal cords under direct vision, positive ETCO2 and breath sounds checked- equal and bilateral Secured at: 21 cm Tube secured with: Tape Dental Injury: Teeth and Oropharynx as per pre-operative assessment

## 2022-10-11 NOTE — Plan of Care (Signed)

## 2022-10-11 NOTE — Interval H&P Note (Signed)
History and Physical Interval Note:  10/11/2022 2:54 PM  [name]  has presented today for surgery, with the diagnosis of PRIMARY HYPER PARATHYROIDISM.  The various methods of treatment have been discussed with the patient and family. After consideration of risks, benefits and other options for treatment, the patient has consented to  Procedure(s): MINIMALLY INVASIVE LEFT INFERIOR PARATHYROIDECTOMY (Left) as a surgical intervention.  The patient's history has been reviewed, patient examined, no change in status, stable for surgery.  I have reviewed the patient's chart and labs.  Questions were answered to the patient's satisfaction.     Axel Filler

## 2022-10-12 ENCOUNTER — Encounter (HOSPITAL_COMMUNITY): Payer: Self-pay | Admitting: General Surgery

## 2022-10-12 DIAGNOSIS — Z91040 Latex allergy status: Secondary | ICD-10-CM | POA: Diagnosis not present

## 2022-10-12 DIAGNOSIS — N189 Chronic kidney disease, unspecified: Secondary | ICD-10-CM | POA: Diagnosis not present

## 2022-10-12 DIAGNOSIS — Z79899 Other long term (current) drug therapy: Secondary | ICD-10-CM | POA: Diagnosis not present

## 2022-10-12 DIAGNOSIS — Z859 Personal history of malignant neoplasm, unspecified: Secondary | ICD-10-CM | POA: Diagnosis not present

## 2022-10-12 DIAGNOSIS — E213 Hyperparathyroidism, unspecified: Secondary | ICD-10-CM | POA: Diagnosis not present

## 2022-10-12 LAB — BASIC METABOLIC PANEL
Anion gap: 9 (ref 5–15)
BUN: 27 mg/dL — ABNORMAL HIGH (ref 8–23)
CO2: 20 mmol/L — ABNORMAL LOW (ref 22–32)
Calcium: 9.7 mg/dL (ref 8.9–10.3)
Chloride: 106 mmol/L (ref 98–111)
Creatinine, Ser: 1.83 mg/dL — ABNORMAL HIGH (ref 0.44–1.00)
GFR, Estimated: 29 mL/min — ABNORMAL LOW (ref >60)
Glucose, Bld: 267 mg/dL — ABNORMAL HIGH (ref 70–99)
Potassium: 5.2 mmol/L — ABNORMAL HIGH (ref 3.5–5.1)
Sodium: 135 mmol/L (ref 135–145)

## 2022-10-12 LAB — GLUCOSE, CAPILLARY: Glucose-Capillary: 224 mg/dL — ABNORMAL HIGH (ref 70–99)

## 2022-10-12 MED ORDER — CALCIUM CARBONATE ANTACID 500 MG PO CHEW: 800.0000 mg | CHEWABLE_TABLET | Freq: Three times a day (TID) | ORAL | 1 refills | Status: DC

## 2022-10-12 MED ORDER — TRAMADOL HCL 50 MG PO TABS: 50.0000 mg | ORAL_TABLET | Freq: Four times a day (QID) | ORAL | 0 refills | Status: DC | PRN

## 2022-10-12 MED ORDER — MAGNESIUM OXIDE -MG SUPPLEMENT 400 (240 MG) MG PO TABS: 400.0000 mg | ORAL_TABLET | Freq: Two times a day (BID) | ORAL | 0 refills | Status: DC

## 2022-10-12 NOTE — Discharge Summary (Signed)
Physician Discharge Summary  Patient ID: [name] MRN: [id number] DOB/AGE: [birth year] 74 y.o.  Admit date: 10/11/2022 Discharge date: 10/12/2022  Admission Diagnoses:hyper PTH  Discharge Diagnoses:  Principal Problem:   S/P parathyroidectomy   Discharged Condition: good  Hospital Course: PT did well post op.  Please see op note for full details.  She was admitted to the floor and placed on a clear diet with adv ad lib.  She had good pain control   Post op day one her Ca was WNL.  She was deemed stable for DC and DC 'd home  Consults: None  Significant Diagnostic Studies: labs: Ca: 9.7  Treatments: surgery: as above  Discharge Exam: Blood pressure (!) 149/70, pulse (!) 55, temperature 97.8 F (36.6 C), temperature source Oral, resp. rate 18, height 5\' 4"  (1.626 m), weight 80.3 kg, SpO2 98 %. General appearance: alert and cooperative Incision/Wound: c/d/i  Disposition: Discharge disposition: 01-Home or Self Care       Discharge Instructions     Diet - low sodium heart healthy   Complete by: As directed    Increase activity slowly   Complete by: As directed       Allergies as of 10/12/2022       Reactions   Dilaudid [hydromorphone Hcl] Nausea And Vomiting   Lipitor [atorvastatin] Other (See Comments)   Body & Muscle Aches   Adhesive [tape] Rash   Ampicillin Rash, Other (See Comments)   Also developed welts on her hands when she took it in the hospital   Latex Itching, Dermatitis, Rash, Other (See Comments)   Patient had a reaction to tape after surgery and they told her she was allergic to Latex   Penicillins Rash           Medication List     TAKE these medications    acetaminophen 500 MG tablet Commonly known as: TYLENOL Take 1,000 mg by mouth every 6 (six) hours as needed for moderate pain, headache or mild pain.   amLODipine 10 MG tablet Commonly known as: NORVASC Take 1 tablet (10 mg total) by mouth daily.   aspirin 81 MG tablet Take  81 mg by mouth daily.   B-D SINGLE USE SWABS REGULAR Pads Use as directed.   calcium carbonate 500 MG chewable tablet Commonly known as: TUMS - dosed in mg elemental calcium Chew 4 tablets (800 mg of elemental calcium total) by mouth 3 (three) times daily.   cetirizine 10 MG tablet Commonly known as: ZYRTEC Take 1 tablet (10 mg total) by mouth daily.   cyanocobalamin 2000 MCG tablet Take 2,000 mcg by mouth daily.   famotidine 40 MG tablet Commonly known as: PEPCID TAKE 1 TABLET BY MOUTH EVERY DAY   Farxiga 10 MG Tabs tablet Generic drug: dapagliflozin propanediol Take 10 mg by mouth daily.   hydrALAZINE 50 MG tablet Commonly known as: APRESOLINE TAKE 1 TABLET BY MOUTH TWICE A DAY   levothyroxine 100 MCG tablet Commonly known as: SYNTHROID Take 1 tablet (100 mcg total) by mouth daily.   losartan 25 MG tablet Commonly known as: COZAAR Take 1 tablet (25 mg total) by mouth daily.   magnesium oxide 400 (240 Mg) MG tablet Commonly known as: MAG-OX Take 1 tablet (400 mg total) by mouth 2 (two) times daily.   metoCLOPramide 10 MG tablet Commonly known as: REGLAN TAKE 1 TABLET FOUR TIMES DAILY   NovoLIN 70/30 (70-30) 100 UNIT/ML injection Generic drug: insulin NPH-regular Human INJECT 45 UNITS INTO THE  SKIN 2 TIMES DAILY WITH A MEAL What changed: See the new instructions.   OMEGA 3 PO Take 2 capsules by mouth daily. Omega XL supplement   pantoprazole 40 MG tablet Commonly known as: PROTONIX Take 1 tablet (40 mg total) by mouth 2 (two) times daily.   polyethylene glycol 17 g packet Commonly known as: MIRALAX / GLYCOLAX Take 17 g by mouth daily as needed for moderate constipation (MIX AND DRINK).   rosuvastatin 20 MG tablet Commonly known as: CRESTOR TAKE 1 TABLET AT BEDTIME   SUMAtriptan 50 MG tablet Commonly known as: IMITREX TAKE 1 TAB EVERY 2 HRS AS NEEDED FOR MIGRAINE. MAY REPEAT IN 2 HOURS IF HEADACHE PERSISTS OR RECURS. What changed: See the new  instructions.   Systane Ultra 0.4-0.3 % Soln Generic drug: Polyethyl Glycol-Propyl Glycol Apply 1-2 drops to eye daily as needed (dry eyes).   torsemide 20 MG tablet Commonly known as: DEMADEX Take 1 tablet (20 mg total) by mouth 2 (two) times daily. What changed:  when to take this reasons to take this   traMADol 50 MG tablet Commonly known as: ULTRAM Take 1 tablet (50 mg total) by mouth every 6 (six) hours as needed (mild pain).   True Metrix Air Glucose Meter w/Device Kit 1 kit by Does not apply route 3 (three) times daily.   True Metrix Blood Glucose Test test strip Generic drug: glucose blood Use as instructed   TRUEplus Lancets 33G Misc Use as directed   Vitamin D (Ergocalciferol) 1.25 MG (50000 UNIT) Caps capsule Commonly known as: DRISDOL Take 1 capsule (50,000 Units total) by mouth every 7 (seven) days.   Vitamin D 50 MCG (2000 UT) tablet Take 2,000 Units by mouth daily.        Follow-up Information     Axel Filler, MD. Schedule an appointment as soon as possible for a visit in 2 week(s).   Specialty: General Surgery Why: Post op visit Contact information: [address] [place] [address] [place] [address] [phone]                 Signed: Axel Filler 10/12/2022, 6:49 AM

## 2022-10-12 NOTE — Plan of Care (Signed)

## 2022-10-14 LAB — SURGICAL PATHOLOGY

## 2022-10-18 IMAGING — US US ABDOMEN LIMITED RUQ/ASCITES
1 series · 14 of 17 positions shown · non-contrast
Comparison: None.

CLINICAL DATA: Left abdominal superficial lumps for 1-2 months.

EXAM:
ULTRASOUND ABDOMEN LIMITED

[Series 1: us abdomen limited · 17 acquisitions, 14 frames shown]
[im 1/17]
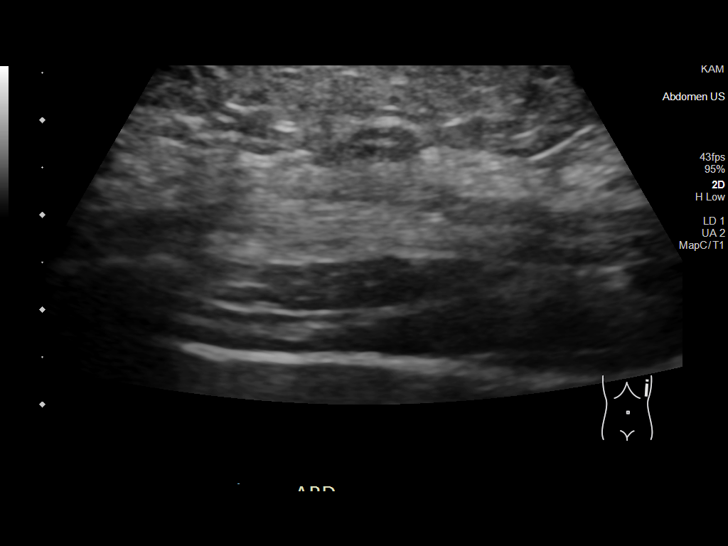
[im 2/17]
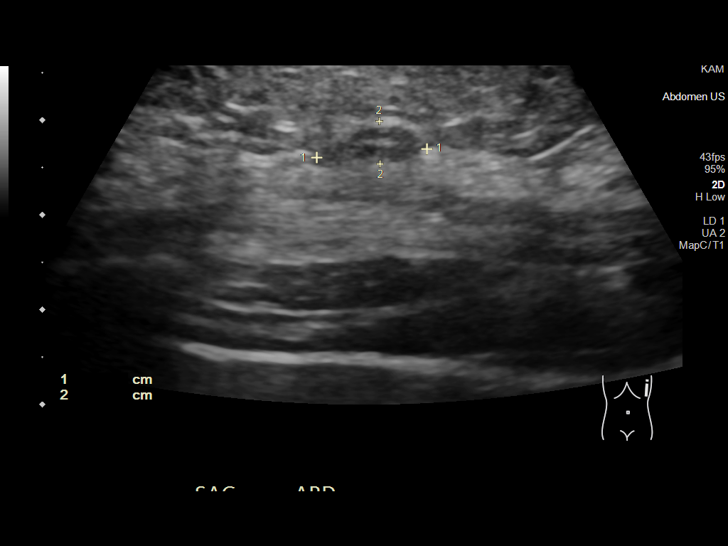
[im 4/17]
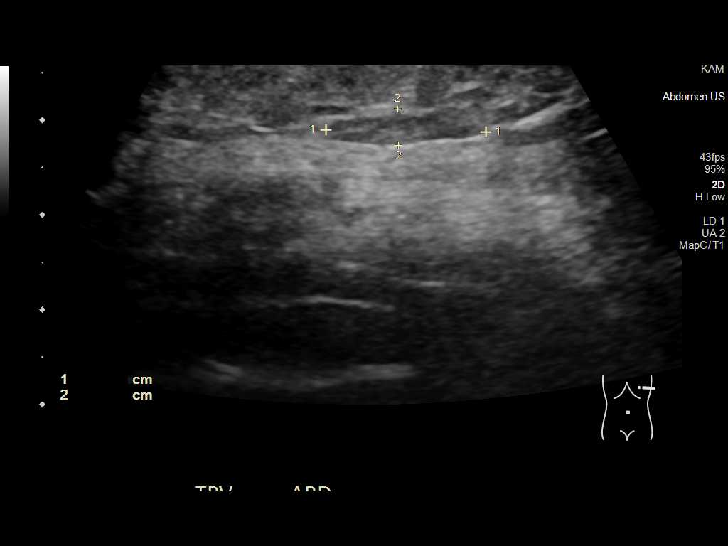
[im 5/17]
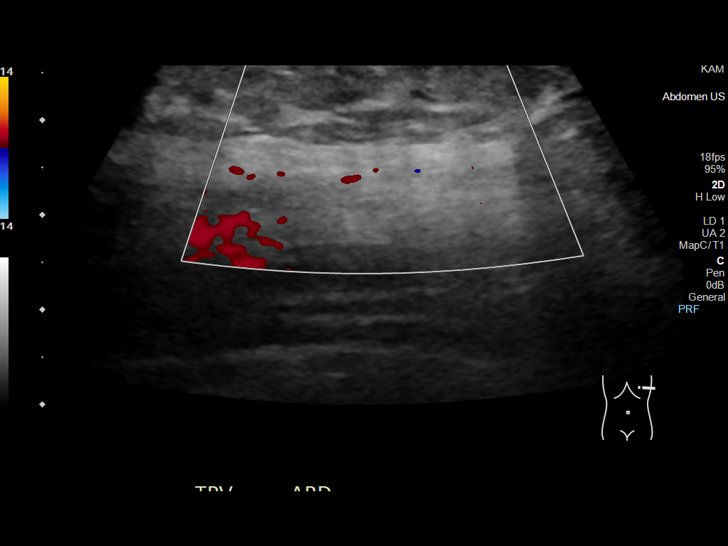
[im 6/17]
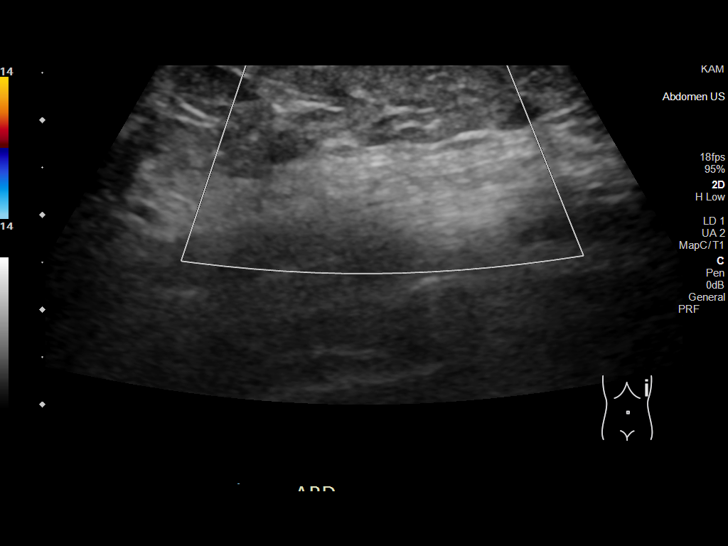
[im 7/17]
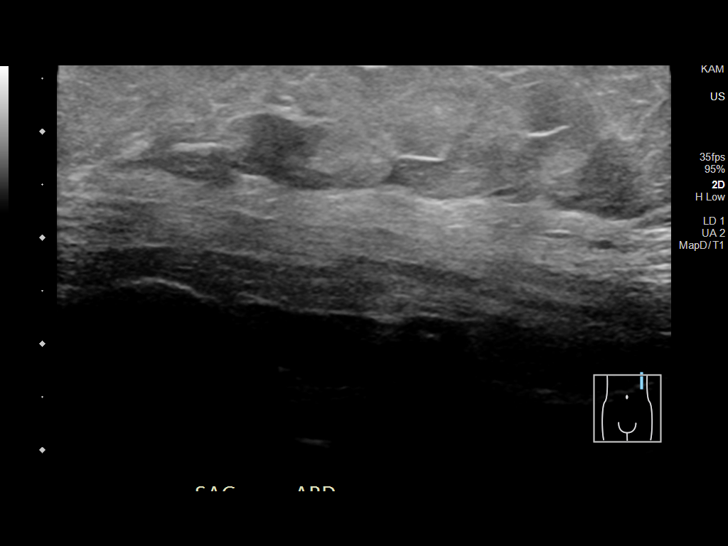
[im 8/17]
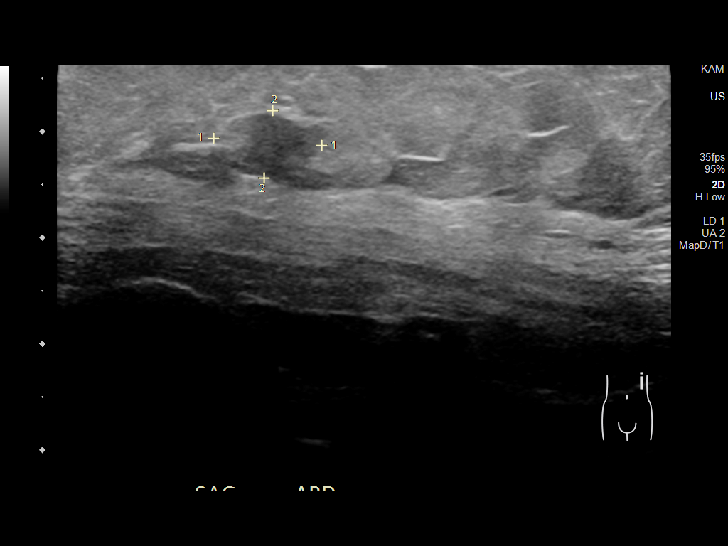
[im 10/17]
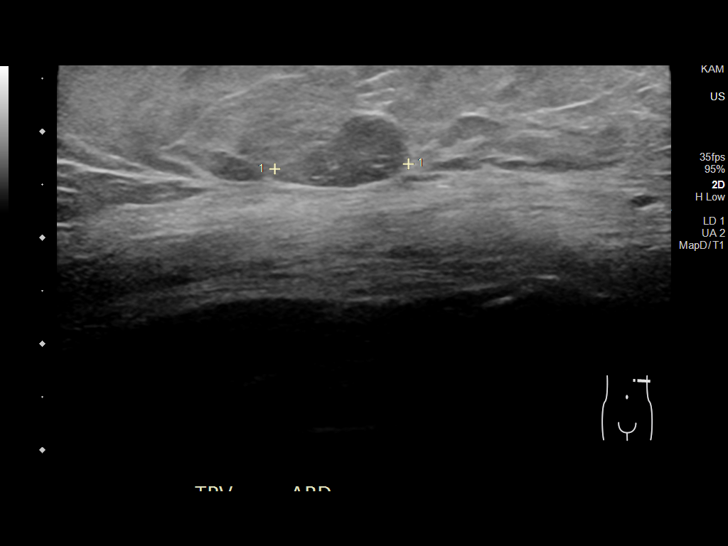
[im 11/17]
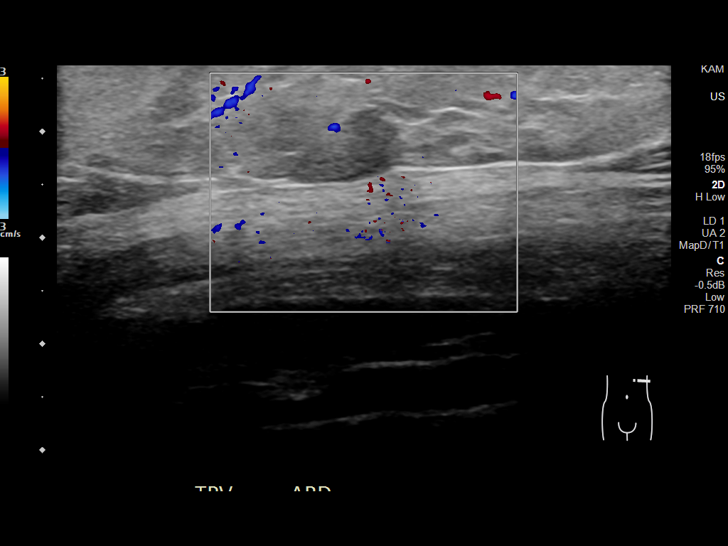
[im 12/17]
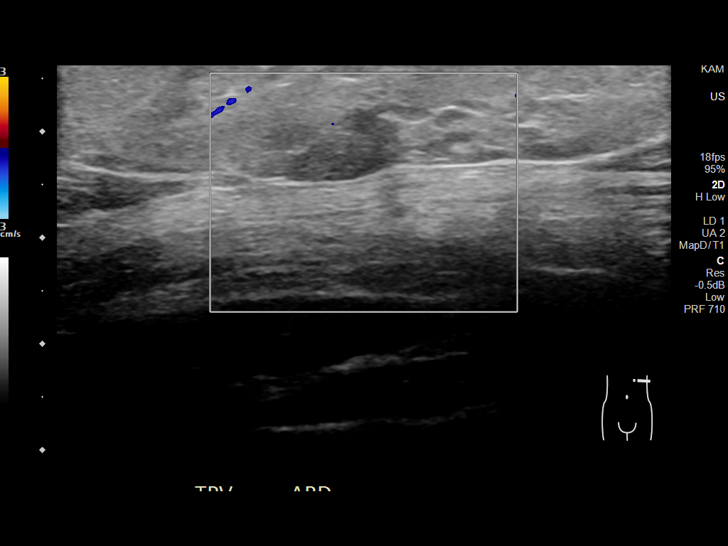
[im 13/17]
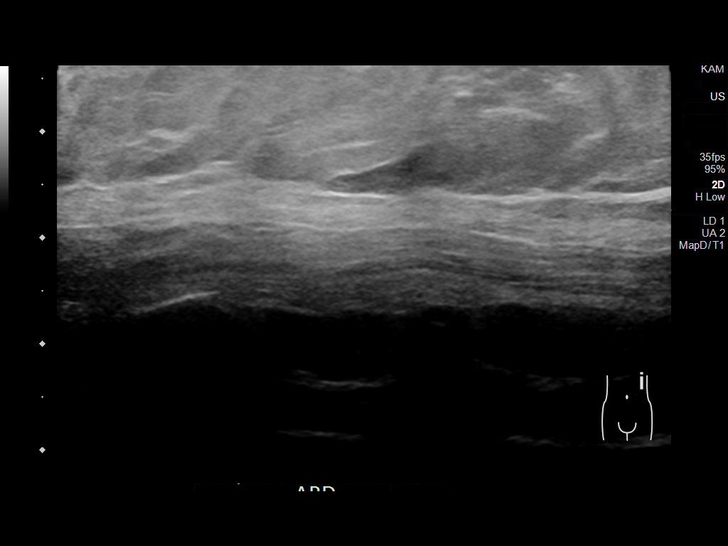
[im 14/17]
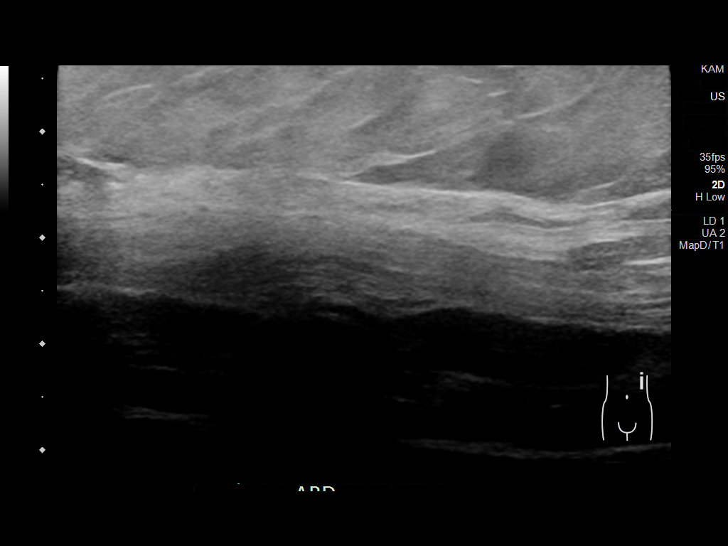
[im 16/17]
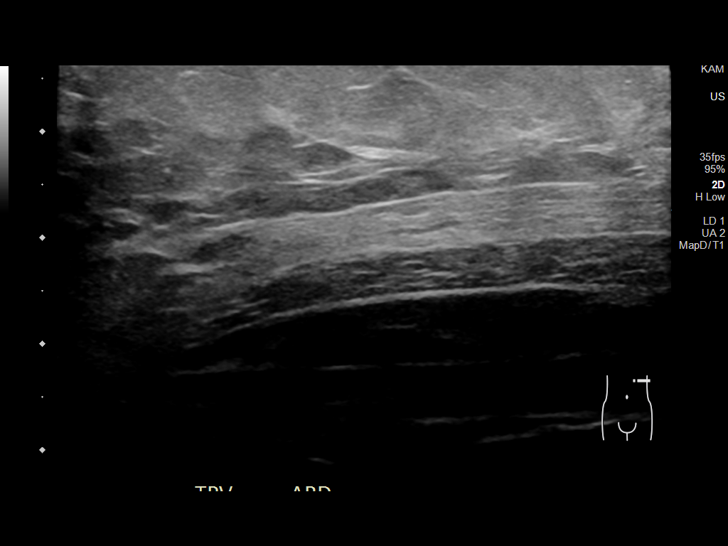
[im 17/17]
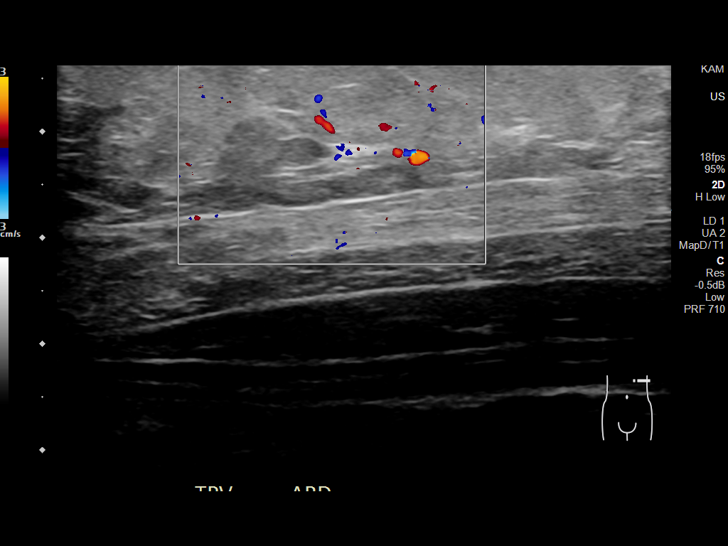

[14 of 17 positions shown; findings below may reference images not displayed]

FINDINGS: Focused ultrasound exam was performed in the region of patient
concern. 2 small hypoechoic subcutaneous lesions are identified in
this region measuring 1.2 and 1.3 cm, respectively. No substantial
flow signal in either nodule on color Doppler evaluation.
IMPRESSION: Small subcutaneous nodules identified in the region of patient
concern without substantial blood flow by color Doppler imaging.
Ultrasound features are nonspecific. CT abdomen with intravenous
contrast may prove helpful to further evaluate, as clinically
warranted.

## 2022-10-18 IMAGING — US US THYROID
1 series · 13 of 25 positions shown · non-contrast
Comparison: 09/12/2018

CLINICAL DATA: 69-year-old female with a history of prior right
hemithyroidectomy and a biopsy of the superior left thyroid nodule
(2.9 cm) Monday September, 2018.

EXAM:
THYROID ULTRASOUND
TECHNIQUE: Ultrasound examination of the thyroid gland and adjacent soft
tissues was performed.

[Series 1: us thyroid · 13 of 78 slices shown]
[im 1/78]
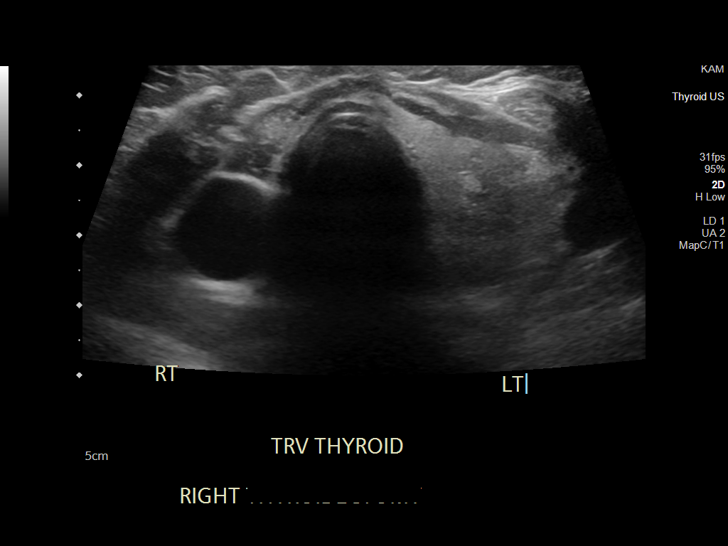
[im 7/78]
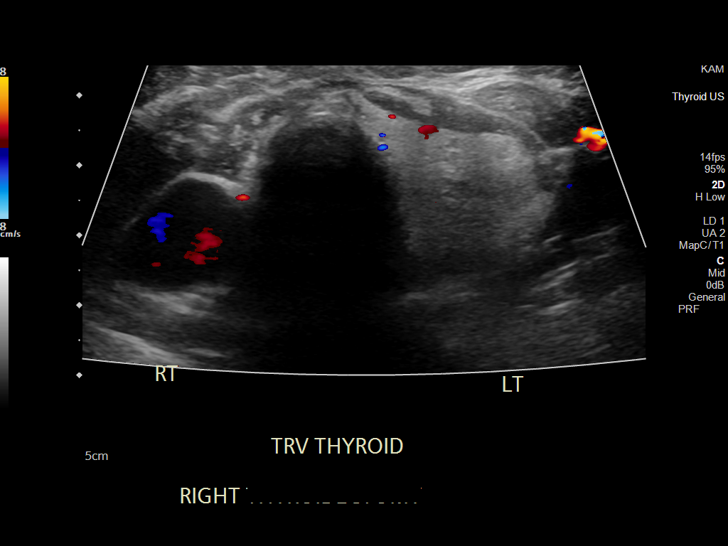
[im 13/78]
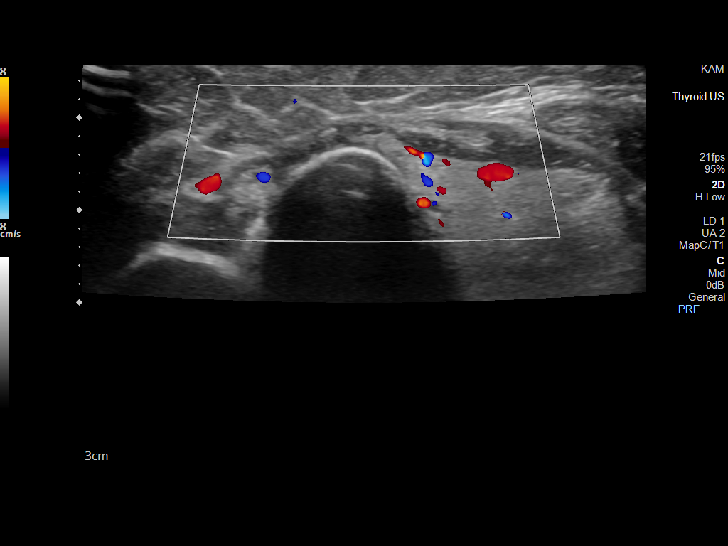
[im 20/78]
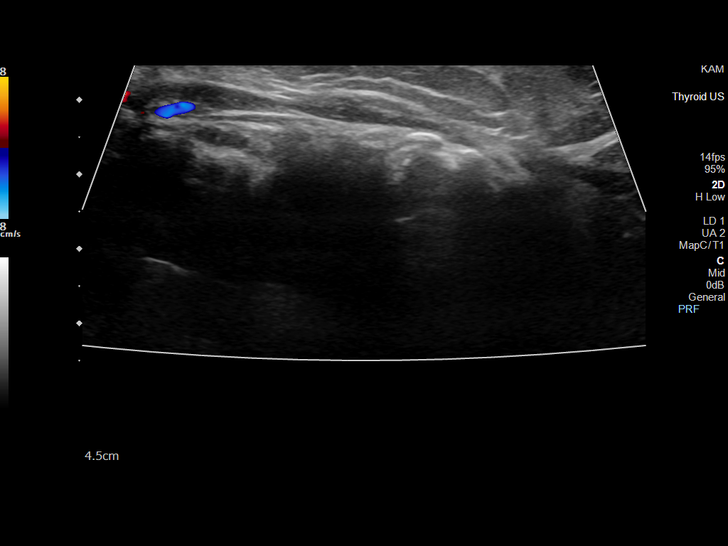
[im 26/78]
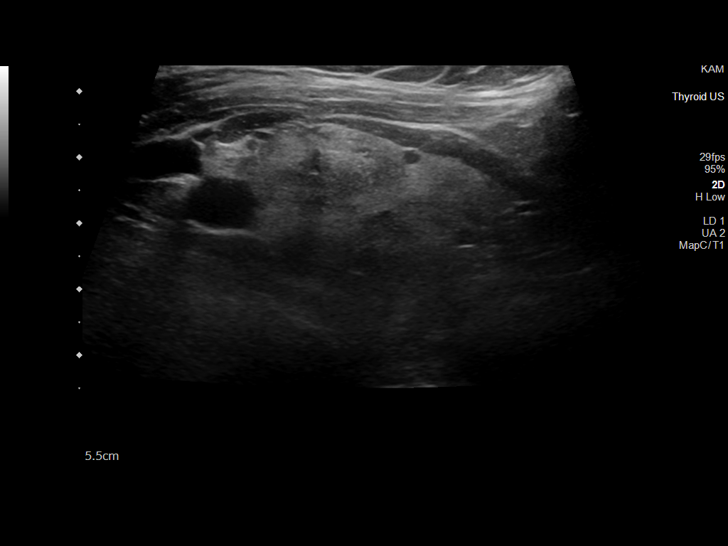
[im 33/78]
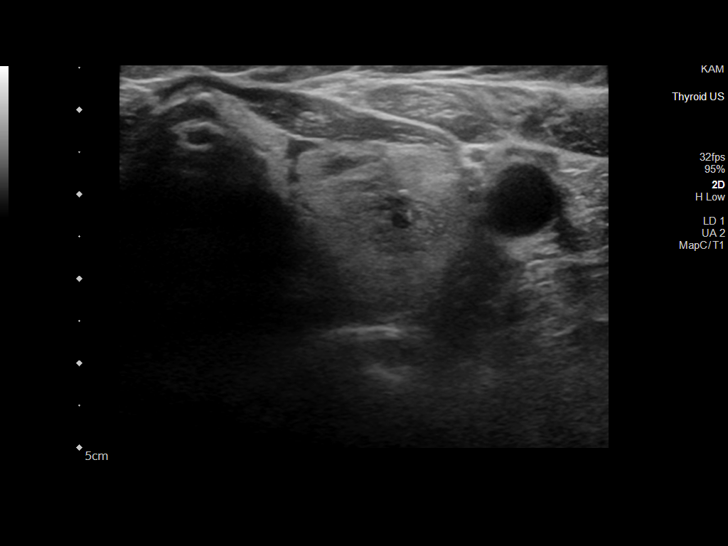
[im 39/78]
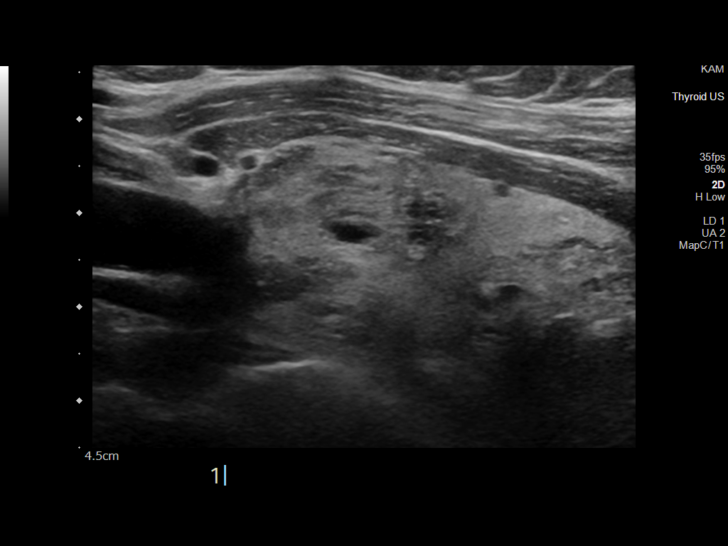
[im 45/78]
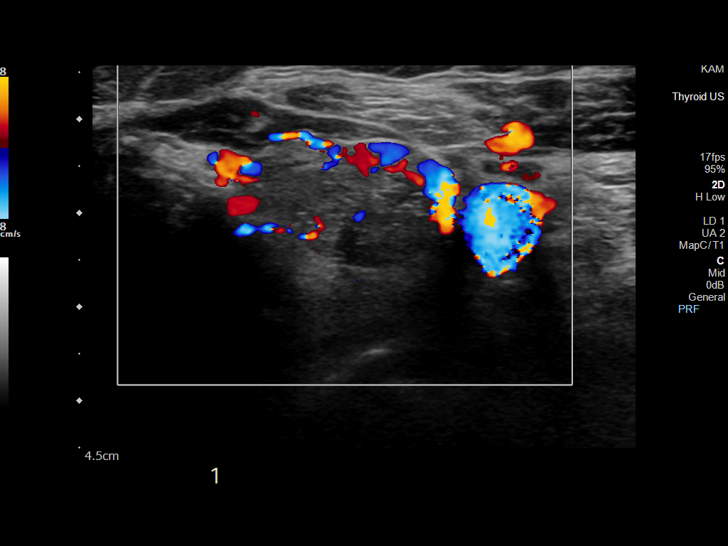
[im 52/78]
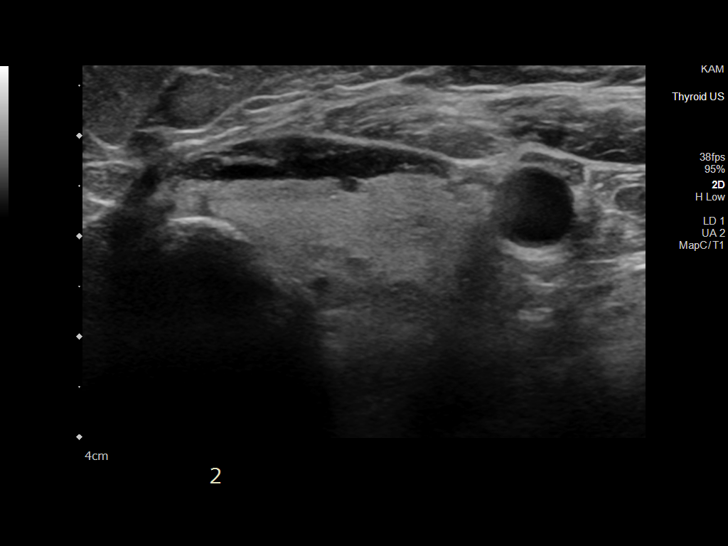
[im 58/78]
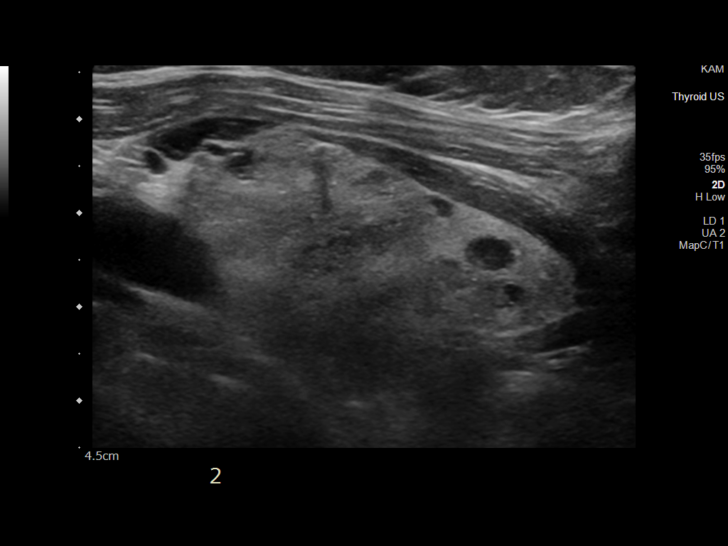
[im 65/78]
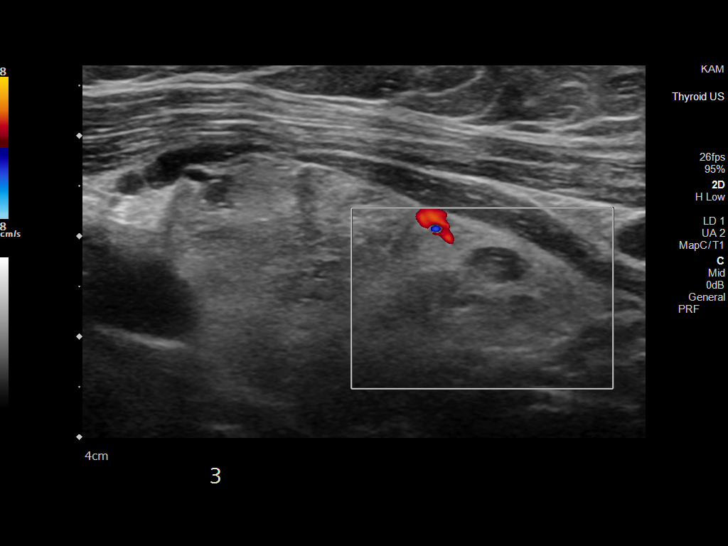
[im 71/78]
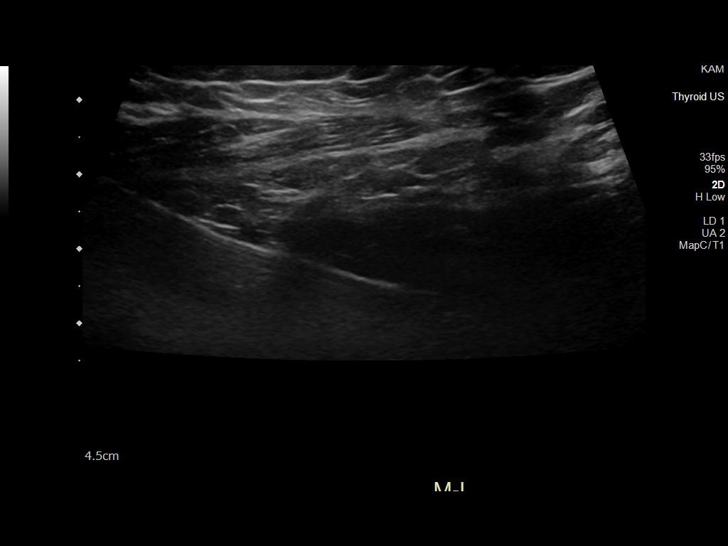
[im 78/78]
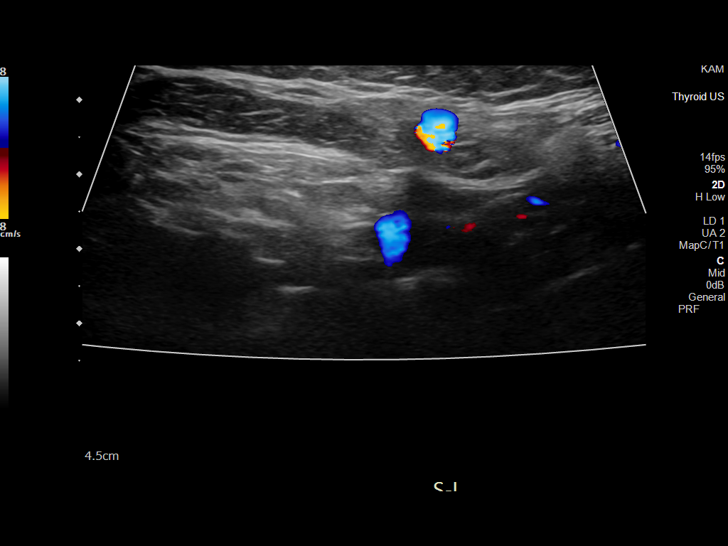

[13 of 25 positions shown; findings below may reference images not displayed]

FINDINGS: Parenchymal Echotexture: Mildly heterogenous

Isthmus: 0.3 cm

Right lobe: Right hemithyroidectomy

Left lobe: 5.1 cm x 2.4 cm x 2.4 cm

_________________________________________________________

Estimated total number of nodules >/= 1 cm: 1

Number of spongiform nodules >/=  2 cm not described below (TR1): 0

Number of mixed cystic and solid nodules >/= 1.5 cm not described
below (TR2): 0

_________________________________________________________

Left superior thyroid nodule labeled 1, 2.7 cm, smaller than
previous biopsy. Nodule has spongiform characteristics and does not
meet criteria for surveillance or further biopsy.

Nodule labeled 2, mid left thyroid, 0.98 cm. Nodule has TR 4
characteristics and does not meet criteria for surveillance or
biopsy.

Nodule labeled 3 inferior left thyroid, 7 mm with spongiform
characteristics and does not meet criteria for surveillance or
biopsy.

No adenopathy

Recommendations follow those established by the new ACR TI-RADS
criteria ([HOSPITAL] 2274;[DATE]).
IMPRESSION: Surgical changes of right hemithyroidectomy.

Heterogeneous and multinodular appearance of the left thyroid
suggesting medical thyroid disease.

Prior biopsy of superior left thyroid nodule (2.7 cm) which has
spongiform characteristics, and assuming a prior benign result
requires no further specific follow-up.

## 2022-10-24 ENCOUNTER — Ambulatory Visit (INDEPENDENT_AMBULATORY_CARE_PROVIDER_SITE_OTHER): Payer: Medicare HMO | Admitting: Family Medicine

## 2022-10-24 ENCOUNTER — Encounter: Payer: Self-pay | Admitting: Family Medicine

## 2022-10-24 VITALS — BP 124/58 | HR 54 | Temp 98.4°F | Resp 18 | Ht 64.0 in | Wt 178.5 lb

## 2022-10-24 DIAGNOSIS — Z9089 Acquired absence of other organs: Secondary | ICD-10-CM

## 2022-10-24 DIAGNOSIS — E213 Hyperparathyroidism, unspecified: Secondary | ICD-10-CM

## 2022-10-24 DIAGNOSIS — Z9889 Other specified postprocedural states: Secondary | ICD-10-CM

## 2022-10-24 NOTE — Patient Instructions (Signed)
Follow up as needed or as scheduled Follow up with Surgery as scheduled The incision looks good! Ask next week if you can decrease the amount of Tums Call with any questions or concerns Hang in there!  You're doing great!!!

## 2022-10-24 NOTE — Progress Notes (Signed)
   Subjective:    Patient ID: [name], female    DOB: [birth year], 72 y.o.   MRN: [id number]  HPI Hospital f/u- pt was admitted 7/2-7/3 for L inferior parathyroidectomy.  Per d/c summary she did well and was able to tolerate diet.  Pain was well controlled and Calcium level was normal on day of d/c (9.7).  Today pt reports incision site is 'itchy'.  Last week was red and warm.  No drainage.  Pt denies pain.  Pt is eating and drinking regularly.    Only medication change was starting Tums 4 tabs TID.     Review of Systems For ROS see HPI     Objective:   Physical Exam Vitals reviewed.  Constitutional:      General: She is not in acute distress.    Appearance: Normal appearance. She is not ill-appearing.  HENT:     Head: Normocephalic and atraumatic.  Musculoskeletal:     Cervical back: Normal range of motion. Tenderness (mild TTP along well healing anterior incision.  no surrounding erythema or induration) present.  Lymphadenopathy:     Cervical: No cervical adenopathy.  Skin:    General: Skin is warm and dry.  Neurological:     General: No focal deficit present.     Mental Status: She is alert and oriented to person, place, and time.  Psychiatric:        Mood and Affect: Mood normal.        Behavior: Behavior normal.        Thought Content: Thought content normal.           Assessment & Plan:  Hyperparathyroidism s/p parathyroidectomy- new.  Pt has f/u w/ surgery next week.  Incision is healing well.  She is not having any pain and she is able to eat and drink w/o difficulty.  Pt to ask surgeon and/or nephrologist regarding the amount of Tums she is taking as she would like to decrease this.

## 2022-10-27 DIAGNOSIS — E210 Primary hyperparathyroidism: Secondary | ICD-10-CM | POA: Diagnosis not present

## 2022-10-28 IMAGING — CT CT CHEST W/O CM
2 of 3 series · 14 of 36 positions shown, 17 images · non-contrast
Comparison: Chest CT May 29, 2020

CLINICAL DATA: nontraumatic central CP, history of widened
ascending aorta

EXAM:
CT CHEST WITHOUT CONTRAST
TECHNIQUE: Multidetector CT imaging of the chest was performed following the
standard protocol without IV contrast.

[Series 2: thorax · axial · 0.84mm/px · z∈[+1498,+1748]mm · 11 of 147 slices shown, 14 images]
[im 11/147  mediastinal]
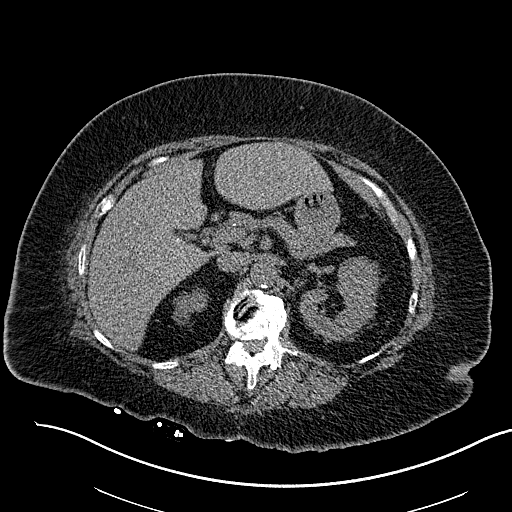
[im 11/147  lung]
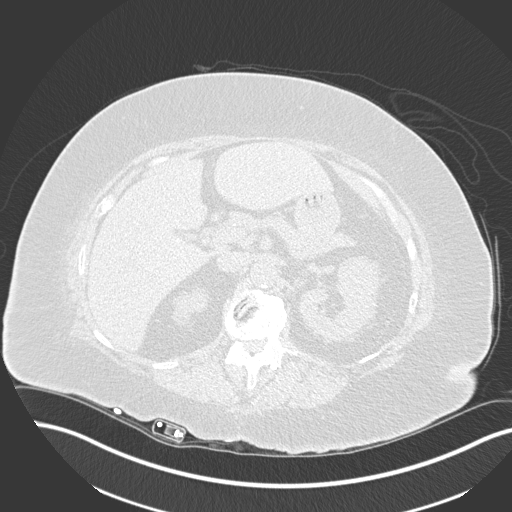
[im 22/147  lung]
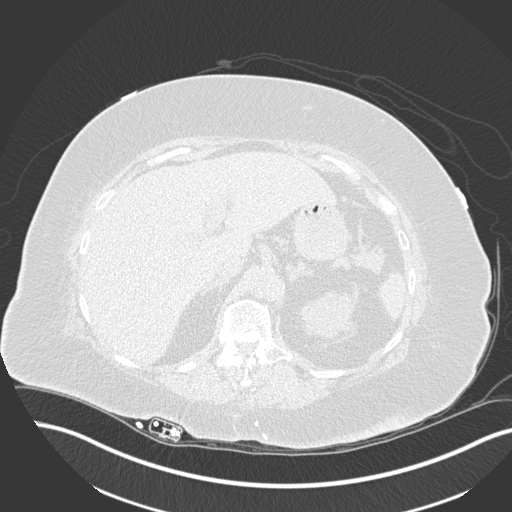
[im 33/147  lung]
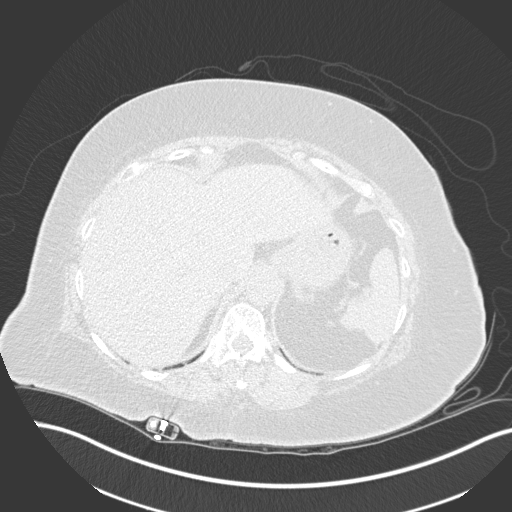
[im 49/147  lung]
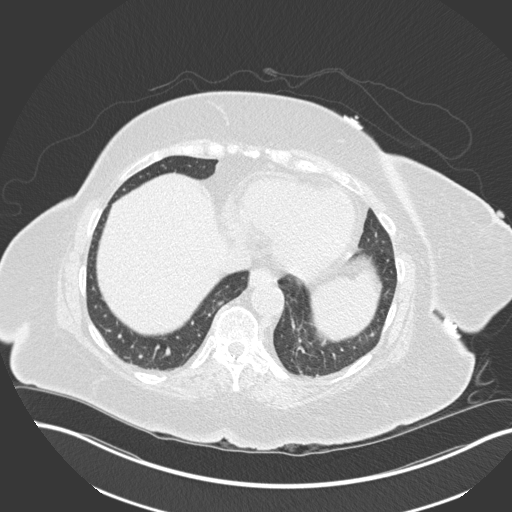
[im 60/147  mediastinal]
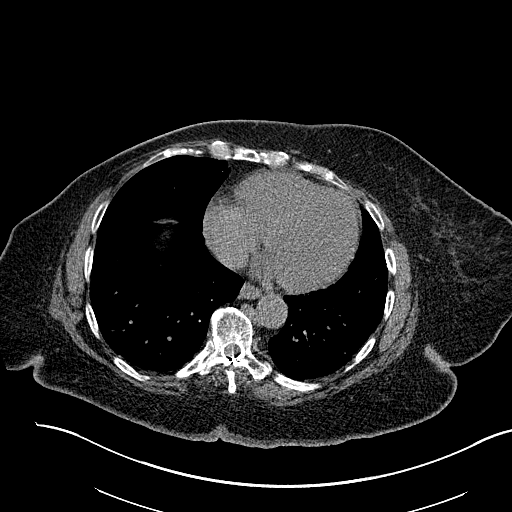
[im 60/147  lung]
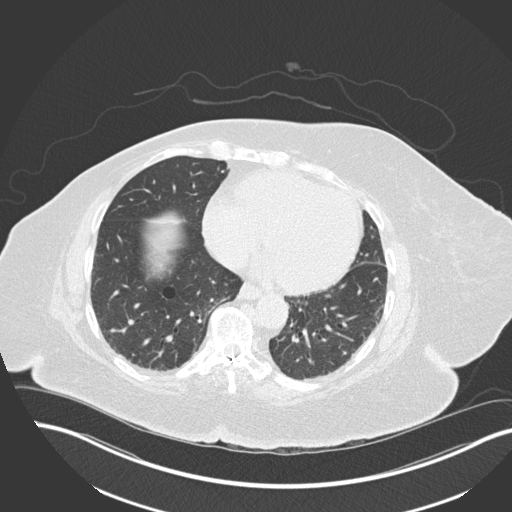
[im 76/147  lung]
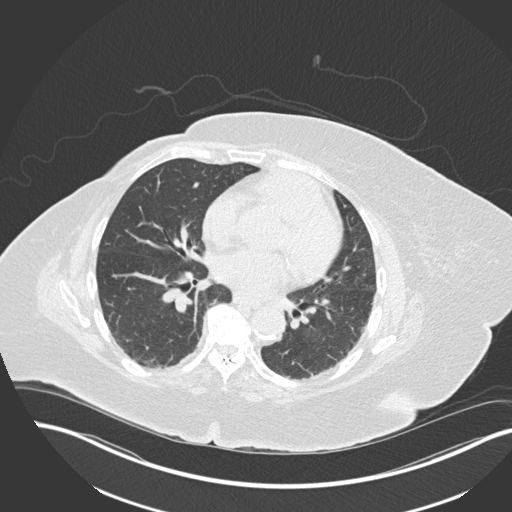
[im 87/147  lung]
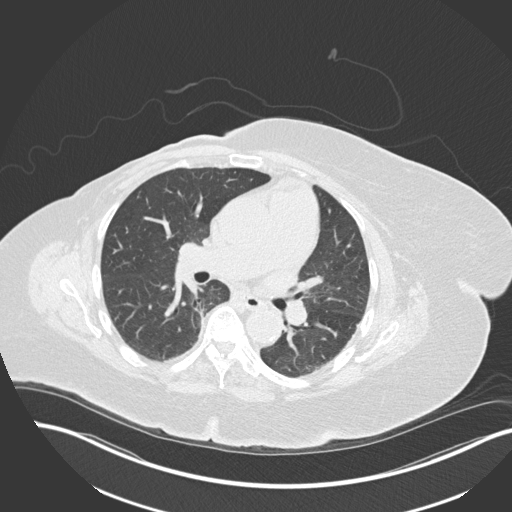
[im 98/147  lung]
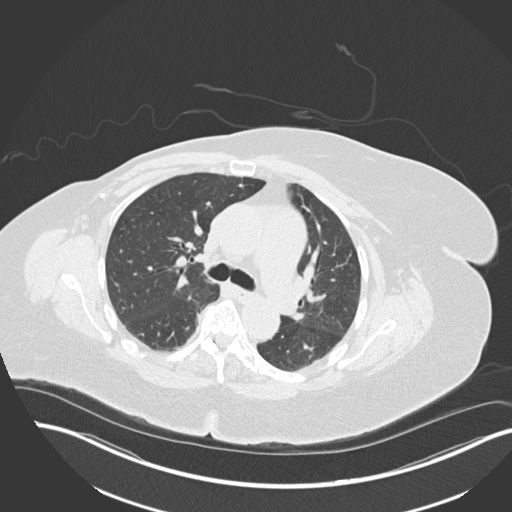
[im 114/147  mediastinal]
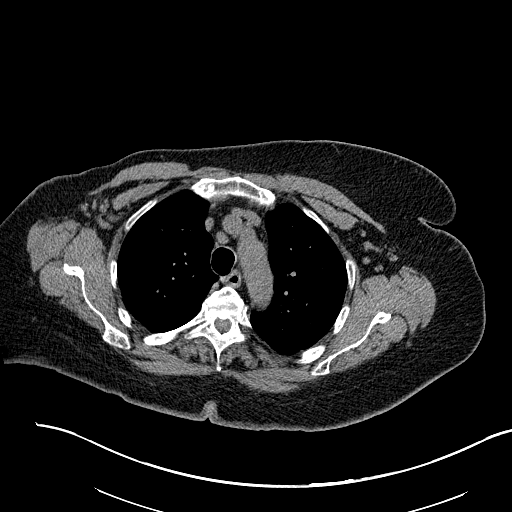
[im 114/147  lung]
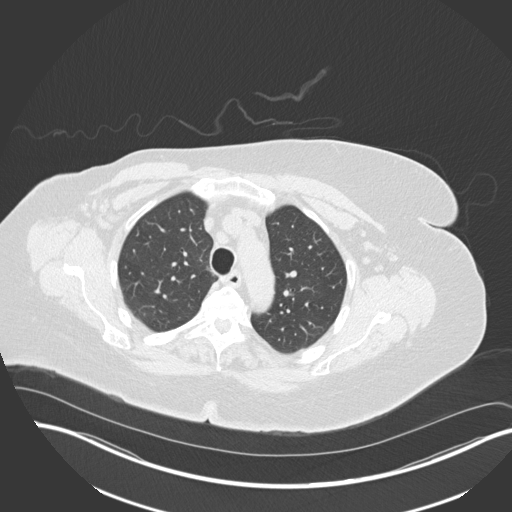
[im 125/147  lung]
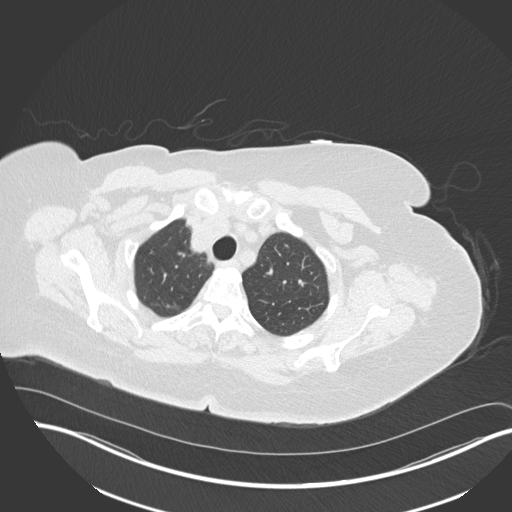
[im 136/147  lung]
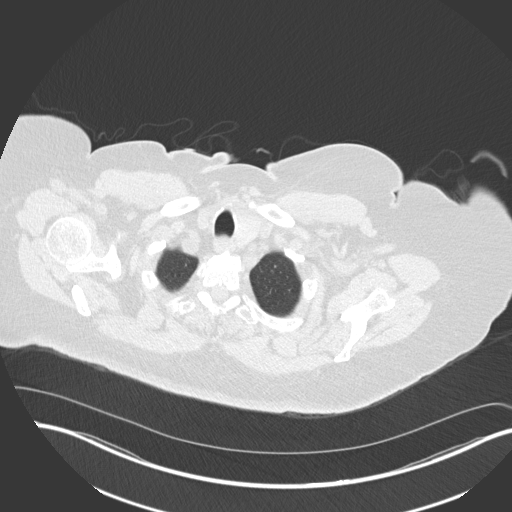

[Series 6: coronal · coronal · 0.61mm/px · 3 of 161 slices shown]
[im 33/161  lung]
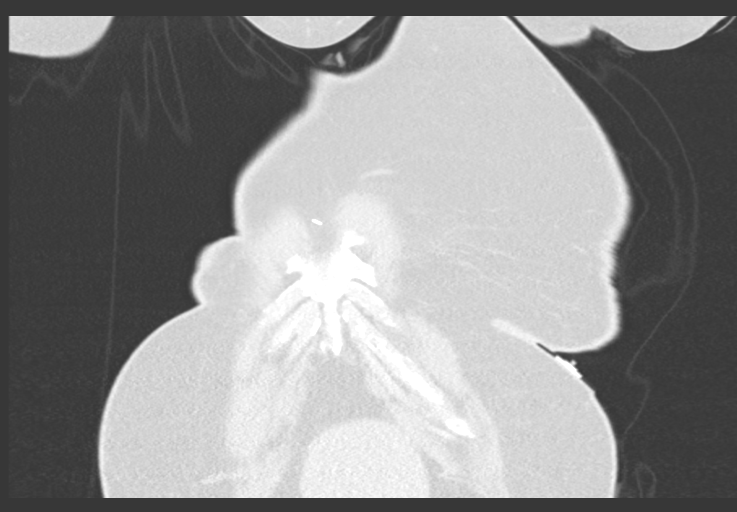
[im 65/161  lung]
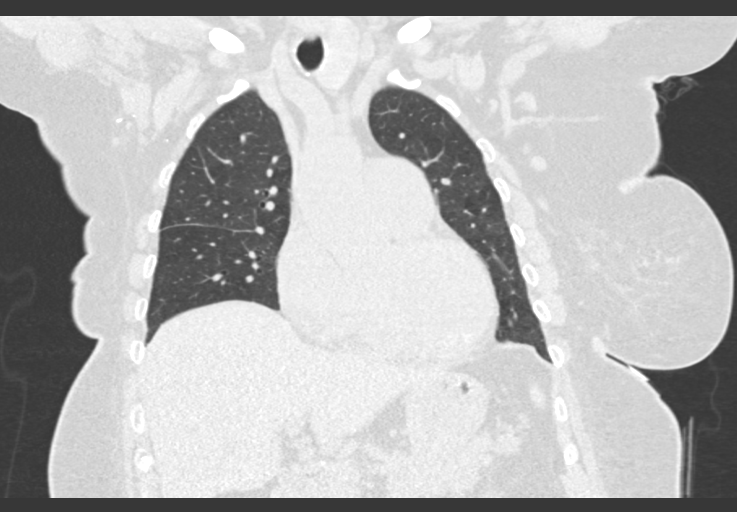
[im 97/161  lung]
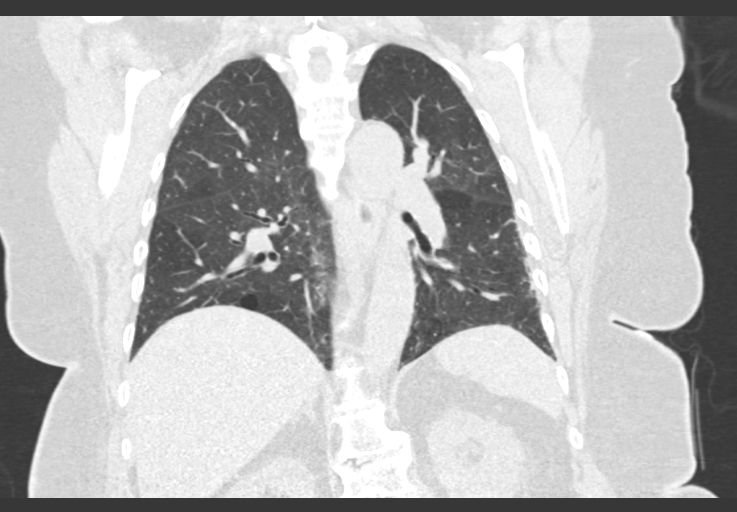

[14 of 36 positions shown; findings below may reference images not displayed]

FINDINGS: Cardiovascular: Ascending thoracic aortic measures 4.1 cm in maximum
axial diameter previously 4.2 cm. Aorta at the sinuses of Valsalva
measures 3.1 cm, unchanged. Maximum diameter of the aortic arch
measures 3.1 cm previously 3.4 cm. Maximum diameter of the
descending aorta at the main pulmonary outflow tract level
measures 3.0 cm previously 3.1 cm. Scattered foci of aortic and
branch vessel atherosclerosis. There are foci of coronary artery
calcification. Trace amount of pericardial fluid is within
physiologic range. The main pulmonary outflow tract measures 3.6 cm
in diameter, unchanged.

Mediastinum/Nodes: Right thyroid and isthmus are surgically absent.
Left thyroid appears normal. No mediastinal or axillary adenopathy.
Right axillary surgical clips.

Lungs/Pleura: Small right lower lobe bulla, stable. Scattered
atelectasis. Similar mosaic attenuation to prior study. No focal
consolidation. No pulmonary edema. No pleural effusion.

Upper Abdomen: Gallbladder surgically absent.  No acute abnormality.

Musculoskeletal: Partially visualized spinal stimulator lead and
lower lumbar fixation hardware.
IMPRESSION: 1. No acute abnormality.
2. Unchanged ascending thoracic aortic aneurysm. Recommend annual
imaging followup by CTA or MRA. This
recommendation follows 2222
ACCF/AHA/AATS/ACR/ASA/SCA/KAVITA/CEEJAY/ATWAL/MICH Guidelines for the
Diagnosis and Management of Patients with Thoracic Aortic Disease.
Circulation. 2222; 121: E266-e369. Aortic aneurysm NOS
(KGPJ3-S93.Q).
3. Unchanged mosaic attenuation, which may reflect small airways
disease.
4. Unchanged enlargement of the main pulmonary outflow tract, which
can be seen with pulmonary arterial hypertension.
5. Aortic atherosclerosis.

Aortic Atherosclerosis (KGPJ3-YO0.0).

## 2022-10-28 IMAGING — CR DG CHEST 2V
2 series · 2 of 2 positions shown · non-contrast
Comparison: April 19, 2019.

CLINICAL DATA: Chest pain.

EXAM:
CHEST - 2 VIEW

[w chest lat]
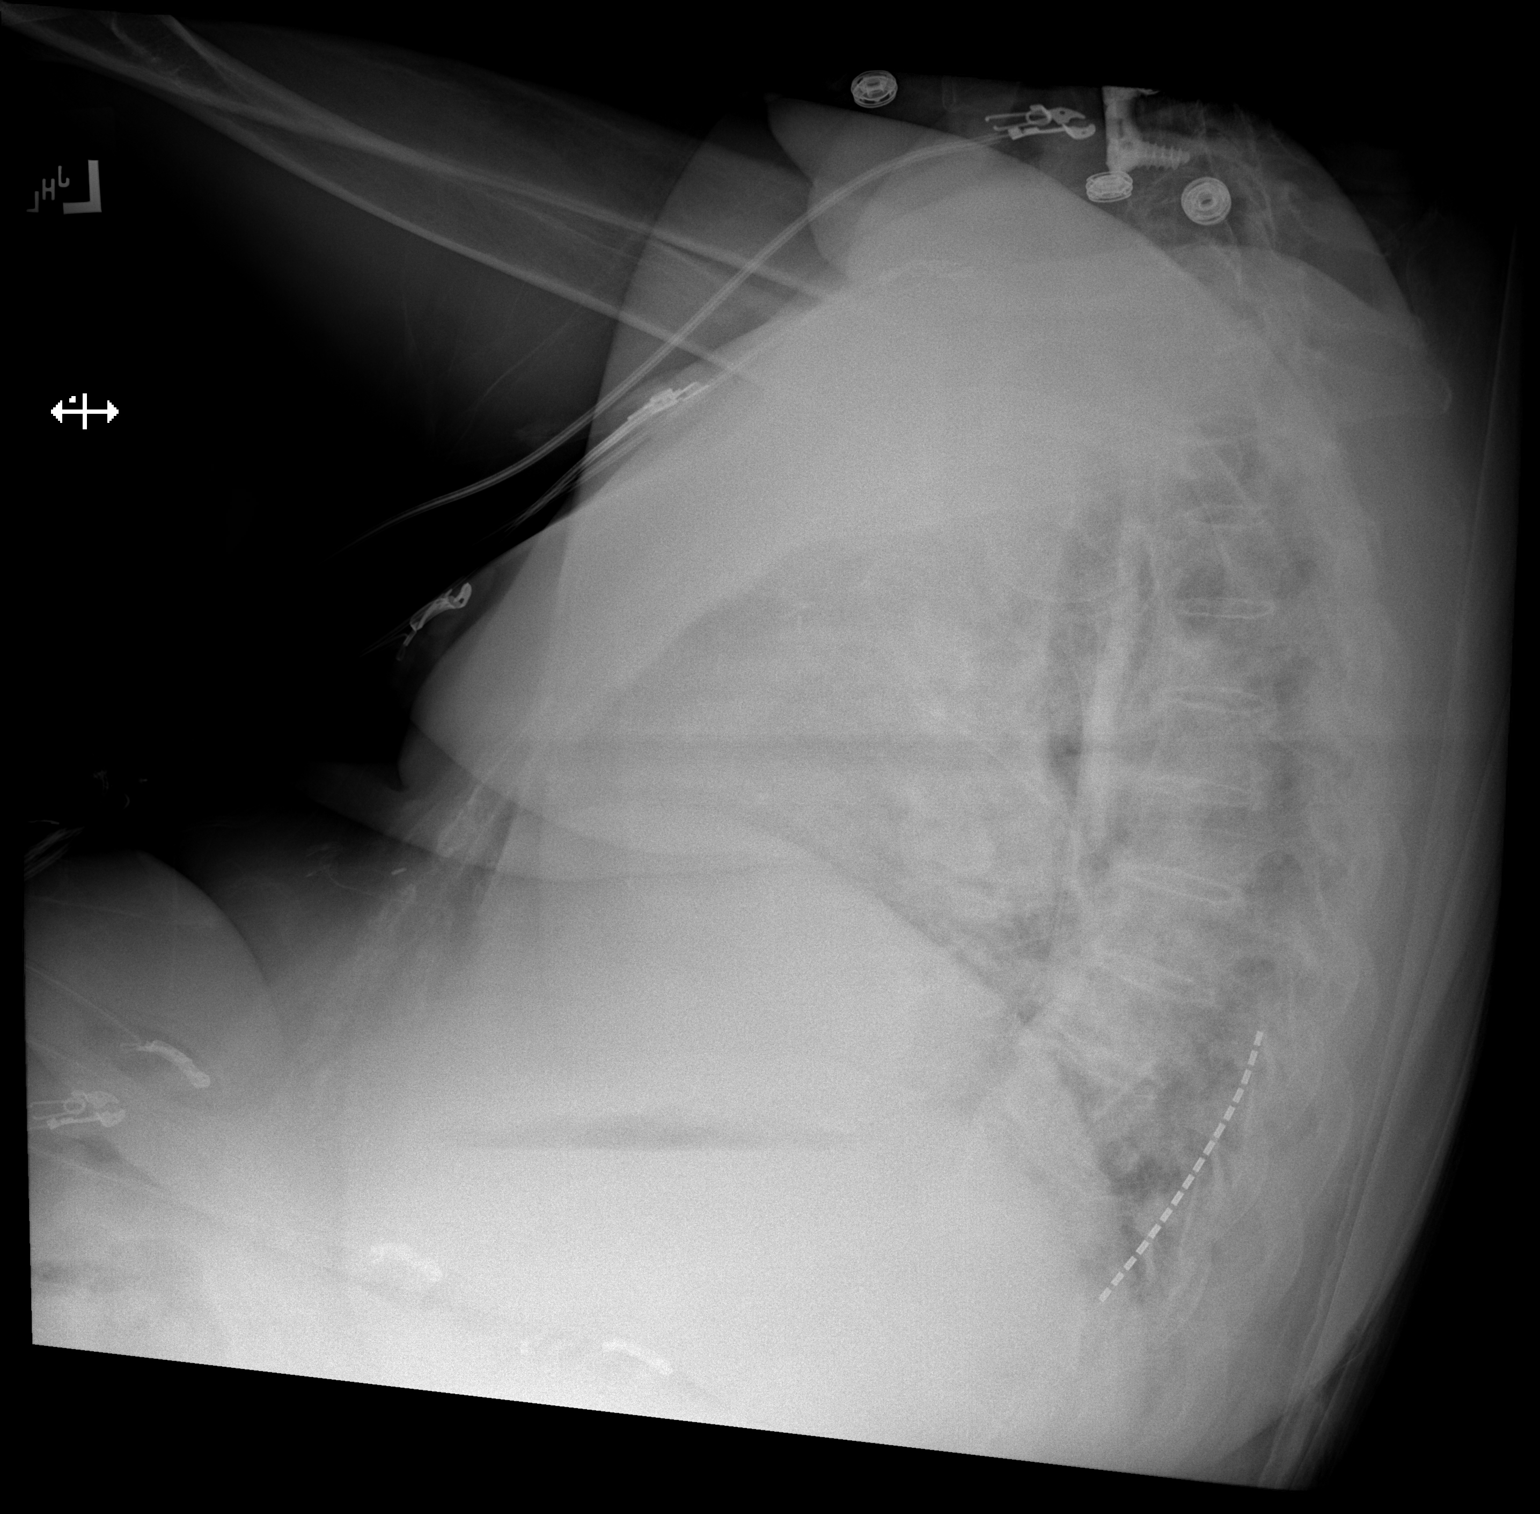

[x chest ap]
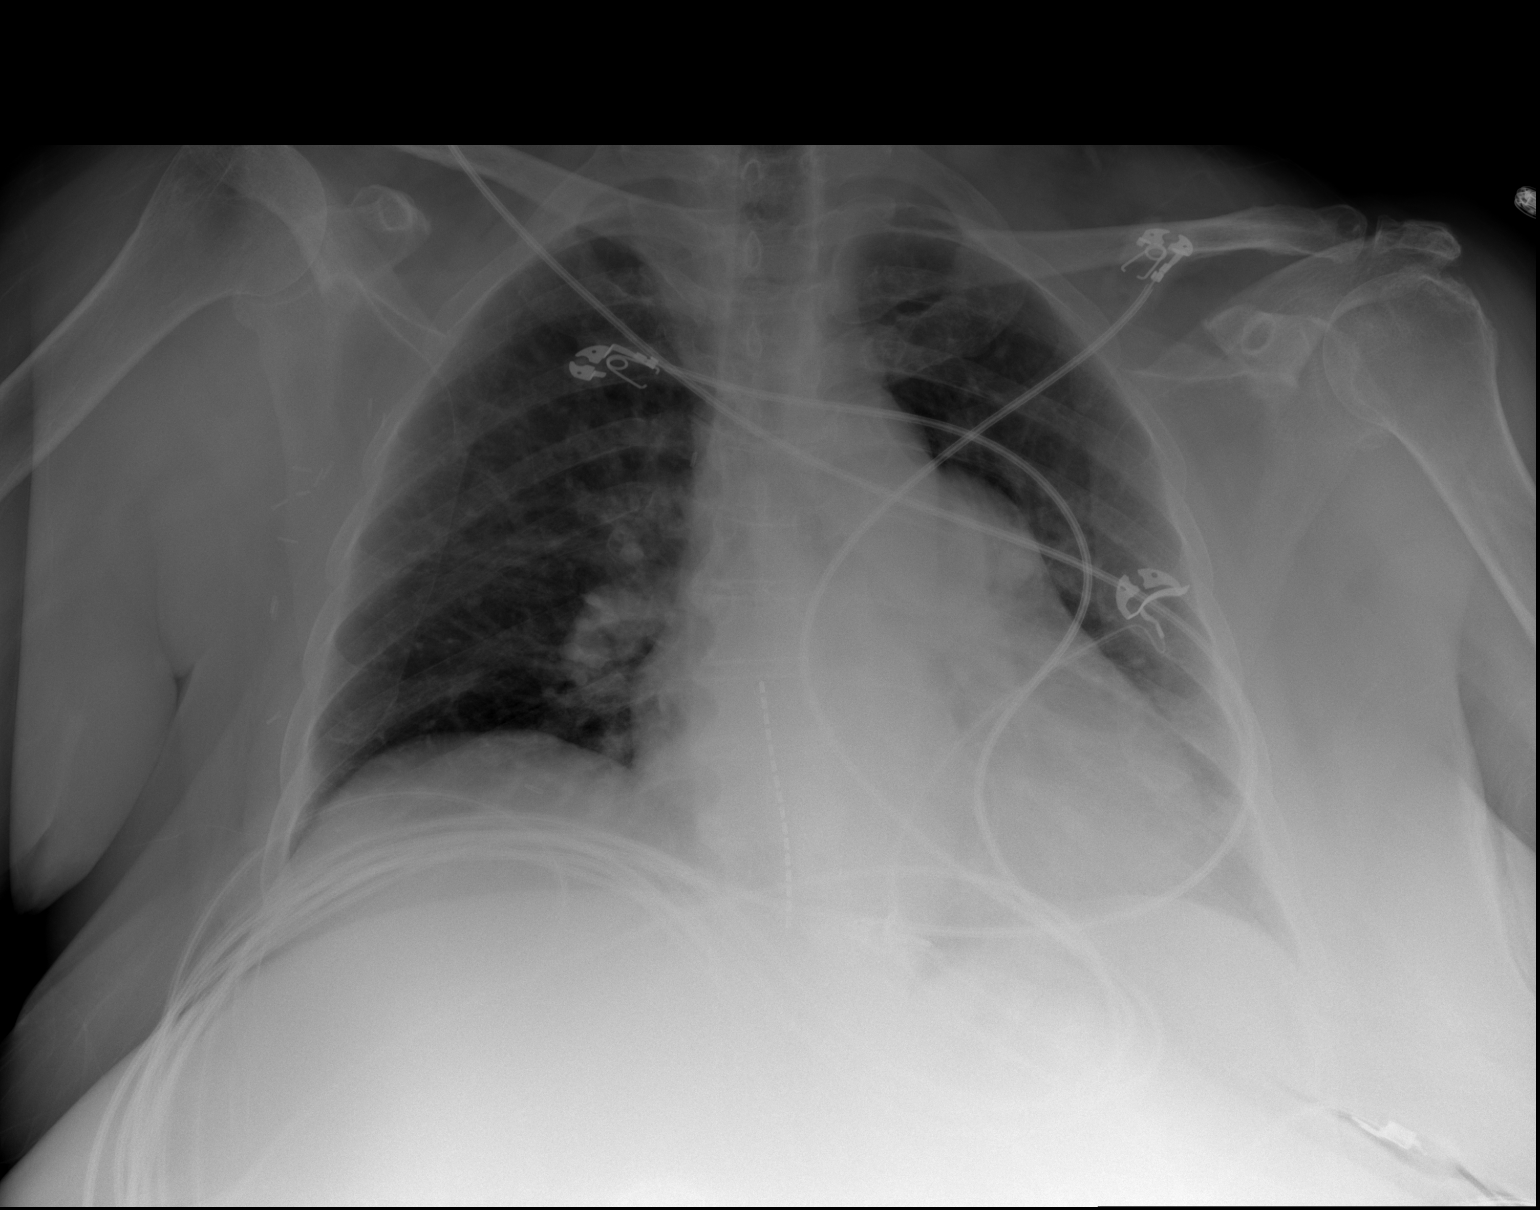

[2 of 2 positions shown; findings below may reference images not displayed]

FINDINGS: Stable cardiomediastinal silhouette. No pneumothorax or pleural
effusion is noted. Both lungs are clear. The visualized skeletal
structures are unremarkable.
IMPRESSION: No active cardiopulmonary disease.

## 2022-10-31 DIAGNOSIS — E1165 Type 2 diabetes mellitus with hyperglycemia: Secondary | ICD-10-CM | POA: Diagnosis not present

## 2022-11-01 ENCOUNTER — Ambulatory Visit: Payer: Medicare HMO | Admitting: Nurse Practitioner

## 2022-11-01 ENCOUNTER — Encounter: Payer: Self-pay | Admitting: Nurse Practitioner

## 2022-11-01 VITALS — BP 142/70 | HR 50 | Ht 64.0 in | Wt 180.4 lb

## 2022-11-01 DIAGNOSIS — Z794 Long term (current) use of insulin: Secondary | ICD-10-CM

## 2022-11-01 DIAGNOSIS — E559 Vitamin D deficiency, unspecified: Secondary | ICD-10-CM

## 2022-11-01 DIAGNOSIS — Z789 Other specified health status: Secondary | ICD-10-CM | POA: Diagnosis not present

## 2022-11-01 DIAGNOSIS — I10 Essential (primary) hypertension: Secondary | ICD-10-CM | POA: Diagnosis not present

## 2022-11-01 DIAGNOSIS — E042 Nontoxic multinodular goiter: Secondary | ICD-10-CM

## 2022-11-01 DIAGNOSIS — Z7984 Long term (current) use of oral hypoglycemic drugs: Secondary | ICD-10-CM | POA: Diagnosis not present

## 2022-11-01 DIAGNOSIS — E1159 Type 2 diabetes mellitus with other circulatory complications: Secondary | ICD-10-CM | POA: Diagnosis not present

## 2022-11-01 DIAGNOSIS — E890 Postprocedural hypothyroidism: Secondary | ICD-10-CM | POA: Diagnosis not present

## 2022-11-01 MED ORDER — NOVOLIN 70/30 (70-30) 100 UNIT/ML ~~LOC~~ SUSP: 25.0000 [IU] | Freq: Two times a day (BID) | SUBCUTANEOUS | Status: DC

## 2022-11-01 NOTE — Progress Notes (Signed)
11/01/2022, 2:11 PM   Endocrinology follow-up note   Subjective:    Patient ID: [name], female    DOB: 17-Dec-1950.  [name] is being seen in follow-up for management of currently uncontrolled,complicated symptomatic type 2 diabetes and associated hypertension, hypothyroidism. PMD :  Sheliah Hatch, MD.   Past Medical History:  Diagnosis Date   Anginal pain (HCC) 03/19/2012   saw Dr. Melburn Popper .. she thinks its more related to stomach issues   Arthritis    "all over; mostly in my back" (03/20/2012)   Breast cancer (HCC)    "right" (03/20/2012)   Cardiomyopathy    Chest pain 06/2005   Hospitalized, dystolic dysfunction   Chronic lower back pain    "have 2 herniated discs; going to have to have a fusion" (03/20/2012)   CKD (chronic kidney disease), stage III (HCC)    Family history of anesthesia complication    "father has nausea and vomiting"   Gallstones    GERD (gastroesophageal reflux disease)    H/O hiatal hernia    History of kidney stones    History of right mastectomy    Hypertension    "patient states never had HTN, takes Hyzaar for heart   Hypothyroidism    Irritable bowel syndrome    Migraines    "it's been a long time since I've had one" (03/20/2012)   Osteoarthritis    Peripheral neuropathy    PONV (postoperative nausea and vomiting)    Sciatica    Sinus bradycardia    Sleep apnea    mild, no CPAP   Type II diabetes mellitus (HCC)    Past Surgical History:  Procedure Laterality Date   ABDOMINAL HYSTERECTOMY  1993   adenosin cardiolite  07/2005   (+) wall motion abnormality   AXILLARY LYMPH NODE DISSECTION  2003   squamous cell cancer   BACK SURGERY     CARDIAC CATHETERIZATION  ? 2005 / 2007   CHOLECYSTECTOMY N/A 01/24/2018   Procedure: LAPAROSCOPIC CHOLECYSTECTOMY;  Surgeon: Harriette Bouillon, MD;  Location: [facility] OR;  Service: General;  Laterality: N/A;    CT abdomen and pelvis  02/2010   Same   ESOPHAGOGASTRODUODENOSCOPY  2005   GERD   KNEE ARTHROSCOPY  1990's   "right" (03/20/2012)   LUMBAR FUSION N/A 08/22/2014   Procedure: Right L2-3 and right L1-2 transforaminal lumbar interbody fusion with pedicle screws, rods, sleeves, cages, local bone graft, Vivigen, cancellous chips;  Surgeon: Kerrin Champagne, MD;  Location: [facility] OR;  Service: Orthopedics;  Laterality: N/A;   LUMBAR LAMINECTOMY/DECOMPRESSION MICRODISCECTOMY N/A 02/04/2014   Procedure: RIGHT L2-3 MICRODISCECTOMY ;  Surgeon: Kerrin Champagne, MD;  Location: [facility] OR;  Service: Orthopedics;  Laterality: N/A;   MASTECTOMY WITH AXILLARY LYMPH NODE DISSECTION  12/2003   "right" (03/20/2012)   PARATHYROIDECTOMY Left 10/11/2022   Procedure: MINIMALLY INVASIVE LEFT INFERIOR PARATHYROIDECTOMY;  Surgeon: Axel Filler, MD;  Location: [facility] OR;  Service: General;  Laterality: Left;   POSTERIOR CERVICAL FUSION/FORAMINOTOMY  2001   SHOULDER ARTHROSCOPY W/ ROTATOR CUFF REPAIR  2003   Left   THYROIDECTOMY  1977   Right   TOTAL KNEE ARTHROPLASTY  2000   Right   [place] of abdomen  07/2005   Fatty liver / kidney stones   Social History   Socioeconomic History   Marital status: Married    Spouse name: Not on file   Number of children: 1   Years of education: Not on file   Highest education level: Not on file  Occupational History   Occupation: Retired since 2003  Tobacco Use   Smoking status: Never   Smokeless tobacco: Never  Vaping Use   Vaping status: Never Used  Substance and Sexual Activity   Alcohol use: No   Drug use: No   Sexual activity: Not on file  Other Topics Concern   Not on file  Social History Narrative   Not on file   Social Determinants of Health   Financial Resource Strain: Medium Risk (12/23/2021)   Overall Financial Resource Strain (CARDIA)    Difficulty of Paying Living Expenses: Somewhat hard  Food Insecurity: No Food Insecurity (10/11/2022)   Hunger Vital Sign     Worried About Running Out of Food in the Last Year: Never true    Ran Out of Food in the Last Year: Never true  Transportation Needs: No Transportation Needs (10/11/2022)   PRAPARE - Administrator, Civil Service (Medical): No    Lack of Transportation (Non-Medical): No  Physical Activity: Insufficiently Active (12/23/2021)   Exercise Vital Sign    Days of Exercise per Week: 3 days    Minutes of Exercise per Session: 30 min  Stress: No Stress Concern Present (12/23/2021)   [employer] of Occupational Health - Occupational Stress Questionnaire    Feeling of Stress : Not at all  Social Connections: Moderately Integrated (12/23/2021)   Social Connection and Isolation Panel [NHANES]    Frequency of Communication with Friends and Family: More than three times a week    Frequency of Social Gatherings with Friends and Family: More than three times a week    Attends Religious Services: More than 4 times per year    Active Member of [employer] or Organizations: No    Attends Banker Meetings: Never    Marital Status: Married   Outpatient Encounter Medications as of 11/01/2022  Medication Sig   acetaminophen (TYLENOL) 500 MG tablet Take 1,000 mg by mouth every 6 (six) hours as needed for moderate pain, headache or mild pain.   Alcohol Swabs (B-D SINGLE USE SWABS REGULAR) PADS Use as directed.   amLODipine (NORVASC) 10 MG tablet Take 1 tablet (10 mg total) by mouth daily.   aspirin 81 MG tablet Take 81 mg by mouth daily.   calcium carbonate (TUMS - DOSED IN MG ELEMENTAL CALCIUM) 500 MG chewable tablet Chew 4 tablets (800 mg of elemental calcium total) by mouth 3 (three) times daily.   cetirizine (ZYRTEC) 10 MG tablet Take 1 tablet (10 mg total) by mouth daily.   Cholecalciferol (VITAMIN D) 50 MCG (2000 UT) tablet Take 2,000 Units by mouth daily.   cyanocobalamin 2000 MCG tablet Take 2,000 mcg by mouth daily.   dapagliflozin propanediol (FARXIGA) 10 MG TABS tablet Take 10 mg by  mouth daily.   famotidine (PEPCID) 40 MG tablet TAKE 1 TABLET BY MOUTH EVERY DAY   hydrALAZINE (APRESOLINE) 50 MG tablet TAKE 1 TABLET BY MOUTH TWICE A DAY   levothyroxine (SYNTHROID) 100 MCG tablet Take 1 tablet (100 mcg total) by mouth daily.   losartan (COZAAR) 25 MG tablet Take 1 tablet (25 mg total)  by mouth daily.   metoCLOPramide (REGLAN) 10 MG tablet TAKE 1 TABLET FOUR TIMES DAILY   Omega-3 Fatty Acids (OMEGA 3 PO) Take 2 capsules by mouth daily. Omega XL supplement   pantoprazole (PROTONIX) 40 MG tablet Take 1 tablet (40 mg total) by mouth 2 (two) times daily.   Polyethyl Glycol-Propyl Glycol (SYSTANE ULTRA) 0.4-0.3 % SOLN Apply 1-2 drops to eye daily as needed (dry eyes).   polyethylene glycol (MIRALAX / GLYCOLAX) packet Take 17 g by mouth daily as needed for moderate constipation (MIX AND DRINK).   rosuvastatin (CRESTOR) 20 MG tablet TAKE 1 TABLET AT BEDTIME   SUMAtriptan (IMITREX) 50 MG tablet TAKE 1 TAB EVERY 2 HRS AS NEEDED FOR MIGRAINE. MAY REPEAT IN 2 HOURS IF HEADACHE PERSISTS OR RECURS. (Patient taking differently: Take 50 mg by mouth every 2 (two) hours as needed for migraine (May repeat in 2 hours if headache persists or recurs.).)   torsemide (DEMADEX) 20 MG tablet Take 1 tablet (20 mg total) by mouth 2 (two) times daily. (Patient taking differently: Take 20 mg by mouth 2 (two) times daily as needed (swelling).)   traMADol (ULTRAM) 50 MG tablet Take 1 tablet (50 mg total) by mouth every 6 (six) hours as needed (mild pain).   TRUEplus Lancets 33G MISC Use as directed   Vitamin D, Ergocalciferol, (DRISDOL) 1.25 MG (50000 UNIT) CAPS capsule Take 1 capsule (50,000 Units total) by mouth every 7 (seven) days.   [DISCONTINUED] insulin NPH-regular Human (NOVOLIN 70/30) (70-30) 100 UNIT/ML injection INJECT 45 UNITS INTO THE SKIN 2 TIMES DAILY WITH A MEAL (Patient taking differently: Inject 22 Units into the skin 2 (two) times daily.)   Blood Glucose Monitoring Suppl (TRUE METRIX AIR  GLUCOSE METER) w/Device KIT 1 kit by Does not apply route 3 (three) times daily. (Patient not taking: Reported on 11/01/2022)   glucose blood (TRUE METRIX BLOOD GLUCOSE TEST) test strip Use as instructed (Patient not taking: Reported on 11/01/2022)   insulin NPH-regular Human (NOVOLIN 70/30) (70-30) 100 UNIT/ML injection Inject 25 Units into the skin 2 (two) times daily with a meal.   magnesium oxide (MAG-OX) 400 (240 Mg) MG tablet Take 1 tablet (400 mg total) by mouth 2 (two) times daily. (Patient not taking: Reported on 10/24/2022)   No facility-administered encounter medications on file as of 11/01/2022.    ALLERGIES: Allergies  Allergen Reactions   Dilaudid [Hydromorphone Hcl] Nausea And Vomiting   Lipitor [Atorvastatin] Other (See Comments)    Body & Muscle Aches   Adhesive [Tape] Rash   Ampicillin Rash and Other (See Comments)    Also developed welts on her hands when she took it in the hospital   Latex Itching, Dermatitis, Rash and Other (See Comments)    Patient had a reaction to tape after surgery and they told her she was allergic to Latex    Penicillins Rash         VACCINATION STATUS: Immunization History  Administered Date(s) Administered   Fluad Quad(high Dose 65+) 12/13/2019, 02/01/2021, 01/06/2022   Influenza Split 01/31/2011   Influenza Whole 02/23/2007   Influenza, High Dose Seasonal PF 01/10/2017   Influenza,inj,Quad PF,6+ Mos 01/07/2014, 02/09/2016, 01/01/2018, 12/03/2018   Influenza-Unspecified 01/10/2012   PFIZER Comirnaty(Gray Top)Covid-19 Tri-Sucrose Vaccine 05/24/2019, 06/18/2019, 01/11/2020, 08/15/2020   PFIZER(Purple Top)SARS-COV-2 Vaccination 05/24/2019, 06/18/2019, 01/11/2020   Pneumococcal Conjugate-13 05/16/2019   Pneumococcal Polysaccharide-23 08/25/2014   Tdap 04/20/2011, 07/20/2022    Diabetes She presents for her follow-up diabetic visit. She has type 2 diabetes  mellitus. Onset time: She was diagnosed at approximate age of 38 years. Her disease  course has been fluctuating. There are no hypoglycemic associated symptoms. Pertinent negatives for hypoglycemia include no confusion, headaches, pallor, seizures or tremors. Associated symptoms include fatigue, polydipsia and polyuria. Pertinent negatives for diabetes include no blurred vision, no chest pain, no foot ulcerations, no polyphagia and no weight loss. Pertinent negatives for hypoglycemia complications include no nocturnal hypoglycemia. Symptoms are improving. Diabetic complications include heart disease, nephropathy and retinopathy. Risk factors for coronary artery disease include diabetes mellitus, hypertension, obesity, sedentary lifestyle, post-menopausal and dyslipidemia. Current diabetic treatment includes insulin injections and oral agent (monotherapy). She is compliant with treatment most of the time. Her weight is increasing steadily. She is following a generally unhealthy diet. When asked about meal planning, she reported none. She has not had a previous visit with a dietitian. She never participates in exercise. Her home blood glucose trend is decreasing steadily. Her overall blood glucose range is 180-200 mg/dl. (She presents today with her CGM and logs showing slightly above target glycemic profile overall.  Her most recent A1c on 6/25 was 7.2%, improving from last visit of 8.4%.  Analysis of her CGM shows TIR 35%, TAR 65%, TBR 0% with a GMI of 8%.  She denies any hypoglycemia since the discontinuation of Glipizide.  She notes she had surgery to remove parathyroid adenoma since last visit.) An ACE inhibitor/angiotensin II receptor blocker is being taken. She does not see a podiatrist.Eye exam is current.  Hypertension This is a chronic problem. The current episode started more than 1 year ago. The problem has been gradually improving since onset. The problem is controlled. Pertinent negatives include no blurred vision, chest pain, headaches, palpitations or shortness of breath. Agents  associated with hypertension include thyroid hormones. Risk factors for coronary artery disease include diabetes mellitus, obesity, sedentary lifestyle, dyslipidemia, family history and post-menopausal state. Past treatments include angiotensin blockers, calcium channel blockers, beta blockers, diuretics and direct vasodilators. The current treatment provides mild improvement. There are no compliance problems.  Hypertensive end-organ damage includes kidney disease, CAD/MI and retinopathy. Identifiable causes of hypertension include chronic renal disease, hyperparathyroidism and a thyroid problem. undergoing workup for hyperparathyroidism.  Thyroid Problem Presents for follow-up visit. Onset time: She underwent what appears to be total thyroidectomy in her 30s for reportedly benign goiter.  Has taken thyroid hormone ever since, currently levothyroxine 100 mcg p.o. nightly. Symptoms include depressed mood and fatigue. Patient reports no cold intolerance, diarrhea, heat intolerance, palpitations, tremors, weight gain or weight loss. The symptoms have been stable. Past treatments include levothyroxine. Prior procedures include thyroidectomy.     Review of systems  Constitutional: + steadily increasing body weight,  current Body mass index is 30.97 kg/m. , no fatigue, no subjective hyperthermia, no subjective hypothermia Eyes: no blurry vision, no xerophthalmia ENT: no sore throat, no nodules palpated in throat, no dysphagia/odynophagia, no hoarseness Cardiovascular: no chest pain, no shortness of breath, no palpitations Respiratory: no cough, no shortness of breath Gastrointestinal: no nausea/vomiting/diarrhea Musculoskeletal: c/o chronic back pain- has spinal stimulator placed Skin: no rashes, no hyperemia Neurological: no tremors, no numbness, no tingling, no dizziness Psychiatric: no depression, no anxiety    Objective:    BP (!) 142/70 (BP Location: Right Arm, Patient Position: Sitting, Cuff  Size: Normal) Comment: Retake manuel cuff  Pulse (!) 50   Ht 5\' 4"  (1.626 m)   Wt 180 lb 6.4 oz (81.8 kg)   BMI 30.97 kg/m   Wt  Readings from Last 3 Encounters:  11/01/22 180 lb 6.4 oz (81.8 kg)  10/24/22 178 lb 8 oz (81 kg)  10/11/22 177 lb (80.3 kg)    BP Readings from Last 3 Encounters:  11/01/22 (!) 142/70  10/24/22 (!) 124/58  10/12/22 (!) 168/78    Physical Exam- Limited  Constitutional:  Body mass index is 30.97 kg/m. , not in acute distress, normal state of mind Eyes:  EOMI, no exophthalmos Musculoskeletal: no gross deformities, strength intact in all four extremities, no gross restriction of joint movements Skin:  no rashes, no hyperemia Neurological: no tremor with outstretched hands       Latest Ref Rng & Units 10/12/2022    2:11 AM 10/04/2022    4:00 PM 07/25/2022   11:44 AM  CMP  Glucose 70 - 99 mg/dL 166  063  016   BUN 8 - 23 mg/dL 27  27  41   Creatinine 0.44 - 1.00 mg/dL 0.10  9.32  3.55   Sodium 135 - 145 mmol/L 135  135  139   Potassium 3.5 - 5.1 mmol/L 5.2  4.3  4.3   Chloride 98 - 111 mmol/L 106  107  103   CO2 22 - 32 mmol/L 20  20  26    Calcium 8.9 - 10.3 mg/dL 9.7  73.2  20.2      Diabetic Labs (most recent): Lab Results  Component Value Date   HGBA1C 7.2 (H) 10/04/2022   HGBA1C 8.4 (H) 06/14/2022   HGBA1C 8.3 (H) 03/17/2022   MICROALBUR 570.4 08/05/2019   MICROALBUR 188.7 06/01/2018   MICROALBUR 38.3 06/30/2016     Lipid Panel ( most recent) Lipid Panel     Component Value Date/Time   CHOL 133 06/14/2022 1122   CHOL 162 02/05/2021 0947   TRIG 225.0 (H) 06/14/2022 1122   HDL 41.50 06/14/2022 1122   HDL 51 02/05/2021 0947   CHOLHDL 3 06/14/2022 1122   VLDL 45.0 (H) 06/14/2022 1122   LDLCALC 76 02/05/2021 0947   LDLCALC 60 06/02/2020 0816   LDLDIRECT 58.0 06/14/2022 1122      Assessment & Plan:   1) DM type 2 causing vascular disease (HCC)  - [name] has currently uncontrolled symptomatic type 2 DM since 72  years of age.  She presents today with her CGM and logs showing slightly above target glycemic profile overall.  Her most recent A1c on 6/25 was 7.2%, improving from last visit of 8.4%.  Analysis of her CGM shows TIR 35%, TAR 65%, TBR 0% with a GMI of 8%.  She denies any hypoglycemia since the discontinuation of Glipizide.  She notes she had surgery to remove parathyroid adenoma since last visit.  -her diabetes is complicated by retinopathy, obesity/sedentary life, and she remains at a high risk for more acute and chronic complications which include CAD, CVA, CKD, retinopathy, and neuropathy. These are all discussed in detail with her.  The following Lifestyle Medicine recommendations according to American College of Lifestyle Medicine [facility]) were discussed and offered to patient and she agrees to start the journey:  A. Whole Foods, Plant-based plate comprising of fruits and vegetables, plant-based proteins, whole-grain carbohydrates was discussed in detail with the patient.   A list for source of those nutrients were also provided to the patient.  Patient will use only water or unsweetened tea for hydration. B.  The need to stay away from risky substances including alcohol, smoking; obtaining 7 to 9 hours of restorative sleep, at  least 150 minutes of moderate intensity exercise weekly, the importance of healthy social connections,  and stress reduction techniques were discussed. C.  A full color page of  Calorie density of various food groups per pound showing examples of each food groups was provided to the patient.  - Nutritional counseling repeated at each appointment due to patients tendency to fall back in to old habits.  - The patient admits there is a room for improvement in their diet and drink choices. -  Suggestion is made for the patient to avoid simple carbohydrates from their diet including Cakes, Sweet Desserts / Pastries, Ice Cream, Soda (diet and regular), Sweet Tea, Candies, Chips,  Cookies, Sweet Pastries, Store Bought Juices, Alcohol in Excess of 1-2 drinks a day, Artificial Sweeteners, Coffee Creamer, and "Sugar-free" Products. This will help patient to have stable blood glucose profile and potentially avoid unintended weight gain.   - I encouraged the patient to switch to unprocessed or minimally processed complex starch and increased protein intake (animal or plant source), fruits, and vegetables.   - Patient is advised to stick to a routine mealtimes to eat 3 meals a day and avoid unnecessary snacks (to snack only to correct hypoglycemia).  - she is following with Norm Salt, RDN, CDE for individualized diabetes education.  - I have approached her with the following individualized plan to manage diabetes and patient agrees:   -She will continue to benefit from her current simplified insulin treatment regimen.    -She is encouraged to stay engaged for proper monitoring and safe use of insulin (which she has struggled with in the past).  -Based on her current glycemic profile, she will tolerate slight increase in her Novolin 70/30 to 25 units SQ BID with meals if glucose is above 90 and she is eating.  She can continue her Farxiga 10 mg po daily as prescribed by nephrology, and is encouraged to stay off Glipizide.  I did reinforce the need for her to inject her premixed insulin if glucose is above 90 and eating (says she didn't inject her morning dose at times if glucose was close to goal).  -She is encouraged to start monitoring blood glucose consistently 4 times per day (using her CGM), before meals and before bed and to call the clinic if she has readings less than 70 or greater than 200 for 3 tests in a row.  - she is warned not to take insulin without proper monitoring per orders.  -She is not a candidate for Metformin due to CKD.    - Patient specific target  A1c;  LDL, HDL, Triglycerides, were discussed in detail.  2) BP/HTN:  Her blood pressure is  controlled to target.  She says she is in quite a bit of pain today.  She is advised to continue Amlodipine 10 mg po daily, Carvedilol 6.25 mg po twice daily, Lasix 40 mg po daily as needed for swelling, Hydralazine 25 mg po TID, and Losartan 125 mg po daily. Will defer any med changes to nephrologist.  3) Lipids/HPL:    Her most recent lipid panel from 06/14/22 shows controlled LDL of 58 and elevated triglycerides of 225.  She is advised to continue Crestor 20 mg po daily.    4)  Weight/Diet:  Her Body mass index is 30.97 kg/m.- clearly complicating her diabetes care.  She is a candidate for modest weight loss.  I discussed with her the fact that loss of 5 - 10% of her  current body  weight will have the most impact on her diabetes management.  CDE Consult will be initiated . Exercise, and detailed carbohydrates information provided  -  detailed on discharge instructions.  5) Hypothyroidism-postsurgical There are no recent TFTs to review.  She is advised to continue Levothyroxine 100 mcg po daily before breakfast.  Will recheck TFTs prior to next visit and adjust dose accordingly.   - We discussed about the correct intake of her thyroid hormone, on empty stomach at fasting, with water, separated by at least 30 minutes from breakfast and other medications,  and separated by more than 4 hours from calcium, iron, multivitamins, acid reflux medications (PPIs). -Patient is made aware of the fact that thyroid hormone replacement is needed for life, dose to be adjusted by periodic monitoring of thyroid function tests.  6) Multinodular goiter. -Her most recent thyroid/neck ultrasound from 09/12/18 revealed 2.9 cm nodule on the left lobe of her thyroid -She has surgically absent right thyroid lobe.  Biopsy of 2.9 cm nodule on the left lobe is benign.    She will not need any surgical intervention at this time.    7) Vitamin D deficiency -Her most recent vitamin D level on 06/02/20 was 15.  She was taken off  her vitamin D supplement by her nephrologist. Will recommend she follow up with her nephrologist regarding low vitamin D levels.  8) Chronic Care/Health Maintenance: -she is on ARB and is encouraged to initiate and continue to follow up with Ophthalmology, Dentist,  Podiatrist at least yearly or according to recommendations, and advised to  stay away from smoking. I have recommended yearly flu vaccine and pneumonia vaccine at least every 5 years; moderate intensity exercise for up to 150 minutes weekly; and  sleep for at least 7 hours a day.  - I advised patient to maintain close follow up with Sheliah Hatch, MD for primary care needs.      I spent  30  minutes in the care of the patient today including review of labs from CMP, Lipids, Thyroid Function, Hematology (current and previous including abstractions from other facilities); face-to-face time discussing  her blood glucose readings/logs, discussing hypoglycemia and hyperglycemia episodes and symptoms, medications doses, her options of short and long term treatment based on the latest standards of care / guidelines;  discussion about incorporating lifestyle medicine;  and documenting the encounter. Risk reduction counseling performed per USPSTF guidelines to reduce obesity and cardiovascular risk factors.     Please refer to Patient Instructions for Blood Glucose Monitoring and Insulin/Medications Dosing Guide"  in media tab for additional information. Please  also refer to " Patient Self Inventory" in the Media  tab for reviewed elements of pertinent patient history.  Ulyess Mort participated in the discussions, expressed understanding, and voiced agreement with the above plans.  All questions were answered to her satisfaction. she is encouraged to contact clinic should she have any questions or concerns prior to her return visit.   Follow up plan: - Return in about 3 months (around 02/01/2023) for Diabetes F/U with A1c in  office, Thyroid follow up, Previsit labs, Bring meter and logs.  Ronny Bacon, [facility] [address] [place], [place] [address] Phone: [phone] Fax: [phone]  11/01/2022, 2:11 PM

## 2022-11-14 ENCOUNTER — Encounter: Payer: Self-pay | Admitting: Internal Medicine

## 2022-11-22 ENCOUNTER — Ambulatory Visit: Payer: Medicare HMO | Admitting: Internal Medicine

## 2022-11-22 ENCOUNTER — Encounter: Payer: Self-pay | Admitting: Internal Medicine

## 2022-11-22 VITALS — BP 147/94 | HR 47 | Temp 97.5°F | Resp 18 | Ht 64.0 in | Wt 177.0 lb

## 2022-11-22 DIAGNOSIS — R195 Other fecal abnormalities: Secondary | ICD-10-CM | POA: Diagnosis not present

## 2022-11-22 DIAGNOSIS — D122 Benign neoplasm of ascending colon: Secondary | ICD-10-CM

## 2022-11-22 DIAGNOSIS — D125 Benign neoplasm of sigmoid colon: Secondary | ICD-10-CM | POA: Diagnosis not present

## 2022-11-22 DIAGNOSIS — Z1211 Encounter for screening for malignant neoplasm of colon: Secondary | ICD-10-CM | POA: Diagnosis not present

## 2022-11-22 DIAGNOSIS — D124 Benign neoplasm of descending colon: Secondary | ICD-10-CM

## 2022-11-22 DIAGNOSIS — E119 Type 2 diabetes mellitus without complications: Secondary | ICD-10-CM | POA: Diagnosis not present

## 2022-11-22 DIAGNOSIS — D123 Benign neoplasm of transverse colon: Secondary | ICD-10-CM | POA: Diagnosis not present

## 2022-11-22 DIAGNOSIS — I10 Essential (primary) hypertension: Secondary | ICD-10-CM | POA: Diagnosis not present

## 2022-11-22 DIAGNOSIS — D120 Benign neoplasm of cecum: Secondary | ICD-10-CM

## 2022-11-22 MED ORDER — SODIUM CHLORIDE 0.9 % IV SOLN: 500.0000 mL | Freq: Once | INTRAVENOUS | Status: DC

## 2022-11-22 NOTE — Progress Notes (Signed)
Sedate, gd SR, tolerated procedure well, VSS, report to RN 

## 2022-11-22 NOTE — Progress Notes (Signed)
HISTORY OF PRESENT ILLNESS:  [name] is a 72 y.o. female is sent today for colonoscopy after positive Cologuard testing.  Previous colonoscopy with Dr. Randa Evens 2011 revealed diverticulosis  REVIEW OF SYSTEMS:  All non-GI ROS negative except for  Past Medical History:  Diagnosis Date   Anginal pain (HCC) 03/19/2012   saw Dr. Melburn Popper .. she thinks its more related to stomach issues   Arthritis    "all over; mostly in my back" (03/20/2012)   Breast cancer (HCC)    "right" (03/20/2012)   Cardiomyopathy    Chest pain 06/2005   Hospitalized, dystolic dysfunction   Chronic lower back pain    "have 2 herniated discs; going to have to have a fusion" (03/20/2012)   CKD (chronic kidney disease), stage III (HCC)    Family history of anesthesia complication    "father has nausea and vomiting"   Gallstones    GERD (gastroesophageal reflux disease)    H/O hiatal hernia    History of kidney stones    History of right mastectomy    Hypertension    "patient states never had HTN, takes Hyzaar for heart   Hypothyroidism    Irritable bowel syndrome    Migraines    "it's been a long time since I've had one" (03/20/2012)   Osteoarthritis    Peripheral neuropathy    PONV (postoperative nausea and vomiting)    Sciatica    Sinus bradycardia    Sleep apnea    mild, no CPAP   Type II diabetes mellitus (HCC)     Past Surgical History:  Procedure Laterality Date   ABDOMINAL HYSTERECTOMY  1993   adenosin cardiolite  07/2005   (+) wall motion abnormality   AXILLARY LYMPH NODE DISSECTION  2003   squamous cell cancer   BACK SURGERY     CARDIAC CATHETERIZATION  ? 2005 / 2007   CHOLECYSTECTOMY N/A 01/24/2018   Procedure: LAPAROSCOPIC CHOLECYSTECTOMY;  Surgeon: Harriette Bouillon, MD;  Location: [facility] OR;  Service: General;  Laterality: N/A;   CT abdomen and pelvis  02/2010   Same   ESOPHAGOGASTRODUODENOSCOPY  2005   GERD   KNEE ARTHROSCOPY  1990's   "right" (03/20/2012)   LUMBAR FUSION  N/A 08/22/2014   Procedure: Right L2-3 and right L1-2 transforaminal lumbar interbody fusion with pedicle screws, rods, sleeves, cages, local bone graft, Vivigen, cancellous chips;  Surgeon: Kerrin Champagne, MD;  Location: [facility] OR;  Service: Orthopedics;  Laterality: N/A;   LUMBAR LAMINECTOMY/DECOMPRESSION MICRODISCECTOMY N/A 02/04/2014   Procedure: RIGHT L2-3 MICRODISCECTOMY ;  Surgeon: Kerrin Champagne, MD;  Location: [facility] OR;  Service: Orthopedics;  Laterality: N/A;   MASTECTOMY WITH AXILLARY LYMPH NODE DISSECTION  12/2003   "right" (03/20/2012)   PARATHYROIDECTOMY Left 10/11/2022   Procedure: MINIMALLY INVASIVE LEFT INFERIOR PARATHYROIDECTOMY;  Surgeon: Axel Filler, MD;  Location: [facility] OR;  Service: General;  Laterality: Left;   POSTERIOR CERVICAL FUSION/FORAMINOTOMY  2001   SHOULDER ARTHROSCOPY W/ ROTATOR CUFF REPAIR  2003   Left   THYROIDECTOMY  1977   Right   TOTAL KNEE ARTHROPLASTY  2000   Right   [place] of abdomen  07/2005   Fatty liver / kidney stones    Social History [name]  reports that she has never smoked. She has never used smokeless tobacco. She reports that she does not drink alcohol and does not use drugs.  family history includes Breast cancer in her maternal aunt; Breast cancer (age of onset: 48) in  her mother; Deep vein thrombosis in her daughter; Diabetes in her brother, brother, father, and another family member; Hypertension in her brother, brother, father, mother, sister, and another family member.  Allergies  Allergen Reactions   Dilaudid [Hydromorphone Hcl] Nausea And Vomiting   Lipitor [Atorvastatin] Other (See Comments)    Body & Muscle Aches   Adhesive [Tape] Rash   Ampicillin Rash and Other (See Comments)    Also developed welts on her hands when she took it in the hospital   Latex Itching, Dermatitis, Rash and Other (See Comments)    Patient had a reaction to tape after surgery and they told her she was allergic to Latex    Penicillins Rash             PHYSICAL EXAMINATION: Vital signs: BP (!) 143/71   Pulse (!) 51   Temp (!) 97.5 F (36.4 C) (Skin)   Ht 5\' 4"  (1.626 m)   Wt 177 lb (80.3 kg)   SpO2 99%   BMI 30.38 kg/m  General: Well-developed, well-nourished, no acute distress HEENT: Sclerae are anicteric, conjunctiva pink. Oral mucosa intact Lungs: Clear Heart: Regular Abdomen: soft, nontender, nondistended, no obvious ascites, no peritoneal signs, normal bowel sounds. No organomegaly. Extremities: No edema Psychiatric: alert and oriented x3. Cooperative     ASSESSMENT:  Positive Cologuard testing Colonoscopy 2011 with diverticulosis   PLAN: Colonoscopy

## 2022-11-22 NOTE — Patient Instructions (Signed)
Discharge instructions given. Handouts on polyps and diverticulosis. Resume previous medications. YOU HAD AN ENDOSCOPIC PROCEDURE TODAY AT THE [facility]:   Refer to the procedure report that was given to you for any specific questions about what was found during the examination.  If the procedure report does not answer your questions, please call your gastroenterologist to clarify.  If you requested that your care partner not be given the details of your procedure findings, then the procedure report has been included in a sealed envelope for you to review at your convenience later.  YOU SHOULD EXPECT: Some feelings of bloating in the abdomen. Passage of more gas than usual.  Walking can help get rid of the air that was put into your GI tract during the procedure and reduce the bloating. If you had a lower endoscopy (such as a colonoscopy or flexible sigmoidoscopy) you may notice spotting of blood in your stool or on the toilet paper. If you underwent a bowel prep for your procedure, you may not have a normal bowel movement for a few days.  Please Note:  You might notice some irritation and congestion in your nose or some drainage.  This is from the oxygen used during your procedure.  There is no need for concern and it should clear up in a day or so.  SYMPTOMS TO REPORT IMMEDIATELY:  Following lower endoscopy (colonoscopy or flexible sigmoidoscopy):  Excessive amounts of blood in the stool  Significant tenderness or worsening of abdominal pains  Swelling of the abdomen that is new, acute  Fever of 100F or higher   For urgent or emergent issues, a gastroenterologist can be reached at any hour by calling [phone]. Do not use MyChart messaging for urgent concerns.    DIET:  We do recommend a small meal at first, but then you may proceed to your regular diet.  Drink plenty of fluids but you should avoid alcoholic beverages for 24 hours.  ACTIVITY:  You should plan to take it  easy for the rest of today and you should NOT DRIVE or use heavy machinery until tomorrow (because of the sedation medicines used during the test).    FOLLOW UP: Our staff will call the number listed on your records the next business day following your procedure.  We will call around 7:15- 8:00 am to check on you and address any questions or concerns that you may have regarding the information given to you following your procedure. If we do not reach you, we will leave a message.     If any biopsies were taken you will be contacted by phone or by letter within the next 1-3 weeks.  Please call us at [phone] if you have not heard about the biopsies in 3 weeks.    SIGNATURES/CONFIDENTIALITY: You and/or your care partner have signed paperwork which will be entered into your electronic medical record.  These signatures attest to the fact that that the information above on your After Visit Summary has been reviewed and is understood.  Full responsibility of the confidentiality of this discharge information lies with you and/or your care-partner. 

## 2022-11-22 NOTE — Op Note (Signed)
[facility] Patient Name: [name] Procedure Date: 11/22/2022 11:14 AM MRN: [id number] Endoscopist: Wilhemina Bonito. Marina Goodell , MD, [id number] Age: 72 Referring MD:  Date of Birth: Aug 06, 1950 Gender: Female Account #: [id number] Procedure:                Colonoscopy with cold snare polypectomy x 11; cold                            biopsy polypectomy x 1 Indications:              Screening for colorectal malignant neoplasm.                            Positive Cologuard testing. Previous colonoscopy                            elsewhere 2011 was negative for neoplasia Medicines:                Monitored Anesthesia Care Procedure:                Pre-Anesthesia Assessment:                           - Prior to the procedure, a History and Physical                            was performed, and patient medications and                            allergies were reviewed. The patient's tolerance of                            previous anesthesia was also reviewed. The risks                            and benefits of the procedure and the sedation                            options and risks were discussed with the patient.                            All questions were answered, and informed consent                            was obtained. Prior Anticoagulants: The patient has                            taken no anticoagulant or antiplatelet agents. ASA                            Grade Assessment: II - A patient with mild systemic                            disease. After reviewing the risks and benefits,  the patient was deemed in satisfactory condition to                            undergo the procedure.                           After obtaining informed consent, the colonoscope                            was passed under direct vision. Throughout the                            procedure, the patient's blood pressure, pulse, and                            oxygen  saturations were monitored continuously. The                            Olympus Scope [id number] was introduced through the                            anus and advanced to the the cecum, identified by                            appendiceal orifice and ileocecal valve. The                            ileocecal valve, appendiceal orifice, and rectum                            were photographed. The quality of the bowel                            preparation was excellent. The colonoscopy was                            performed without difficulty. The patient tolerated                            the procedure well. The bowel preparation used was                            SUPREP via split dose instruction. Scope In: 11:20:48 AM Scope Out: 11:42:46 AM Scope Withdrawal Time: 0 hours 13 minutes 57 seconds  Total Procedure Duration: 0 hours 21 minutes 58 seconds  Findings:                 Eleven polyps were found in the sigmoid colon,                            descending colon, transverse colon, ascending colon                            and cecum. The polyps were 3 to 9 mm in size. These  polyps were removed with a cold snare. Resection                            and retrieval were complete.                           A 1 mm polyp was found in the transverse colon. The                            polyp was removed with a jumbo cold forceps.                            Resection and retrieval were complete.                           Multiple diverticula were found in the left colon.                           The exam was otherwise without abnormality on                            direct and retroflexion views. Complications:            No immediate complications. Estimated blood loss:                            None. Estimated Blood Loss:     Estimated blood loss: none. Impression:               - Ten 3 to 9 mm polyps in the sigmoid colon, in the                             descending colon, in the transverse colon, in the                            ascending colon and in the cecum, removed with a                            cold snare. Resected and retrieved.                           - One 1 mm polyp in the transverse colon, removed                            with a jumbo cold forceps. Resected and retrieved.                           - Diverticulosis in the left colon.                           - The examination was otherwise normal on direct                            and retroflexion views. Recommendation:           -  Repeat colonoscopy in 1 year for surveillance.                           - Patient has a contact number available for                            emergencies. The signs and symptoms of potential                            delayed complications were discussed with the                            patient. Return to normal activities tomorrow.                            Written discharge instructions were provided to the                            patient.                           - Resume previous diet.                           - Continue present medications.                           - Await pathology results. Wilhemina Bonito. Marina Goodell, MD 11/22/2022 11:50:41 AM This report has been signed electronically.

## 2022-11-22 NOTE — Progress Notes (Signed)
Called to room to assist during endoscopic procedure.  Patient ID and intended procedure confirmed with present staff. Received instructions for my participation in the procedure from the performing physician.  

## 2022-11-23 ENCOUNTER — Telehealth: Payer: Self-pay | Admitting: *Deleted

## 2022-11-23 NOTE — Telephone Encounter (Signed)
  Follow up Call-     11/22/2022   10:47 AM  Call back number  Post procedure Call Back phone  # [phone]  Permission to leave phone message Yes     Patient questions:  Do you have a fever, pain , or abdominal swelling? No. Pain Score  0 *  Have you tolerated food without any problems? Yes.    Have you been able to return to your normal activities? Yes.    Do you have any questions about your discharge instructions: Diet   No. Medications  No. Follow up visit  No.  Do you have questions or concerns about your Care? No.  Actions: * If pain score is 4 or above: No action needed, pain <4.

## 2022-11-24 ENCOUNTER — Encounter (INDEPENDENT_AMBULATORY_CARE_PROVIDER_SITE_OTHER): Payer: Self-pay

## 2022-11-25 ENCOUNTER — Encounter: Payer: Self-pay | Admitting: Internal Medicine

## 2022-12-15 ENCOUNTER — Ambulatory Visit: Payer: Medicare HMO | Admitting: Family Medicine

## 2022-12-22 DIAGNOSIS — N184 Chronic kidney disease, stage 4 (severe): Secondary | ICD-10-CM | POA: Diagnosis not present

## 2022-12-22 DIAGNOSIS — R803 Bence Jones proteinuria: Secondary | ICD-10-CM | POA: Diagnosis not present

## 2022-12-22 DIAGNOSIS — R768 Other specified abnormal immunological findings in serum: Secondary | ICD-10-CM | POA: Diagnosis not present

## 2022-12-22 DIAGNOSIS — R809 Proteinuria, unspecified: Secondary | ICD-10-CM | POA: Diagnosis not present

## 2022-12-22 DIAGNOSIS — I272 Pulmonary hypertension, unspecified: Secondary | ICD-10-CM | POA: Diagnosis not present

## 2022-12-22 DIAGNOSIS — I130 Hypertensive heart and chronic kidney disease with heart failure and stage 1 through stage 4 chronic kidney disease, or unspecified chronic kidney disease: Secondary | ICD-10-CM | POA: Diagnosis not present

## 2022-12-22 DIAGNOSIS — E1122 Type 2 diabetes mellitus with diabetic chronic kidney disease: Secondary | ICD-10-CM | POA: Diagnosis not present

## 2022-12-22 DIAGNOSIS — I5032 Chronic diastolic (congestive) heart failure: Secondary | ICD-10-CM | POA: Diagnosis not present

## 2022-12-23 LAB — LAB REPORT - SCANNED
Creatinine, POC: 44.4 mg/dL
EGFR: 31

## 2023-01-03 NOTE — Progress Notes (Signed)
HPI: FU CAD.  Previous chest CT showed coronary calcification.  ABIs October 2021 normal. Chest CT March 2023 showed 4.1 cm ascending aorta.  Echocardiogram April 2024 showed normal LV function, mild left ventricular hypertrophy, grade 1 diastolic dysfunction, mild right ventricular enlargement, trace aortic insufficiency, mildly dilated ascending aorta at 38 mm.  Monitor May 2024 showed sinus rhythm with occasional PAC.  Nuclear study May 2024 showed normal LV function and no ischemia or infarction.  Also with history of syncope felt not to be cardiac related.  Also with history of mild bradycardia.  Since last seen she occasionally has CP which is chronic; described as a "throbbing" pain lasting 2 days at a time; not exertional or pleuritic. No dyspnea or syncope  Current Outpatient Medications  Medication Sig Dispense Refill   acetaminophen (TYLENOL) 500 MG tablet Take 1,000 mg by mouth every 6 (six) hours as needed for moderate pain, headache or mild pain.     Alcohol Swabs (B-D SINGLE USE SWABS REGULAR) PADS Use as directed. 100 each 1   aspirin 81 MG tablet Take 81 mg by mouth daily.     Blood Glucose Monitoring Suppl (TRUE METRIX AIR GLUCOSE METER) w/Device KIT 1 kit by Does not apply route 3 (three) times daily. 1 kit 0   cetirizine (ZYRTEC) 10 MG tablet Take 1 tablet (10 mg total) by mouth daily. 30 tablet 6   Cholecalciferol (VITAMIN D) 50 MCG (2000 UT) tablet Take 2,000 Units by mouth daily.     cyanocobalamin 2000 MCG tablet Take 2,000 mcg by mouth daily.     dapagliflozin propanediol (FARXIGA) 10 MG TABS tablet Take 10 mg by mouth daily.     famotidine (PEPCID) 40 MG tablet TAKE 1 TABLET BY MOUTH EVERY DAY 90 tablet 1   glucose blood (TRUE METRIX BLOOD GLUCOSE TEST) test strip Use as instructed 100 each 12   hydrALAZINE (APRESOLINE) 50 MG tablet TAKE 1 TABLET BY MOUTH TWICE A DAY 180 tablet 1   insulin NPH-regular Human (NOVOLIN 70/30) (70-30) 100 UNIT/ML injection Inject 25 Units  into the skin 2 (two) times daily with a meal.     levothyroxine (SYNTHROID) 100 MCG tablet Take 1 tablet (100 mcg total) by mouth daily. 90 tablet 3   losartan (COZAAR) 25 MG tablet Take 1 tablet (25 mg total) by mouth daily. 90 tablet 1   magnesium oxide (MAG-OX) 400 (240 Mg) MG tablet Take 1 tablet (400 mg total) by mouth 2 (two) times daily. 60 tablet 0   metoCLOPramide (REGLAN) 10 MG tablet TAKE 1 TABLET FOUR TIMES DAILY 360 tablet 3   Omega-3 Fatty Acids (OMEGA 3 PO) Take 2 capsules by mouth daily. Omega XL supplement     pantoprazole (PROTONIX) 40 MG tablet Take 1 tablet (40 mg total) by mouth 2 (two) times daily. 180 tablet 1   Polyethyl Glycol-Propyl Glycol (SYSTANE ULTRA) 0.4-0.3 % SOLN Apply 1-2 drops to eye daily as needed (dry eyes).     polyethylene glycol (MIRALAX / GLYCOLAX) packet Take 17 g by mouth daily as needed for moderate constipation (MIX AND DRINK).     rosuvastatin (CRESTOR) 20 MG tablet TAKE 1 TABLET AT BEDTIME 90 tablet 3   SUMAtriptan (IMITREX) 50 MG tablet TAKE 1 TAB EVERY 2 HRS AS NEEDED FOR MIGRAINE. MAY REPEAT IN 2 HOURS IF HEADACHE PERSISTS OR RECURS. (Patient taking differently: Take 50 mg by mouth every 2 (two) hours as needed for migraine (May repeat in 2 hours if  headache persists or recurs.).) 9 tablet 1   torsemide (DEMADEX) 20 MG tablet Take 1 tablet (20 mg total) by mouth 2 (two) times daily. (Patient taking differently: Take 20 mg by mouth 2 (two) times daily as needed (swelling).) 180 tablet 1   traMADol (ULTRAM) 50 MG tablet Take 1 tablet (50 mg total) by mouth every 6 (six) hours as needed (mild pain). 15 tablet 0   TRUEplus Lancets 33G MISC Use as directed 300 each 3   amLODipine (NORVASC) 10 MG tablet Take 1 tablet (10 mg total) by mouth daily. (Patient not taking: Reported on 01/13/2023) 90 tablet 3   calcium carbonate (TUMS - DOSED IN MG ELEMENTAL CALCIUM) 500 MG chewable tablet Chew 4 tablets (800 mg of elemental calcium total) by mouth 3 (three) times  daily. (Patient not taking: Reported on 01/13/2023) 90 tablet 1   Vitamin D, Ergocalciferol, (DRISDOL) 1.25 MG (50000 UNIT) CAPS capsule Take 1 capsule (50,000 Units total) by mouth every 7 (seven) days. (Patient not taking: Reported on 01/13/2023) 7 capsule 12   No current facility-administered medications for this visit.     Past Medical History:  Diagnosis Date   Anginal pain (HCC) 03/19/2012   saw Dr. Melburn Popper .. she thinks its more related to stomach issues   Arthritis    "all over; mostly in my back" (03/20/2012)   Breast cancer (HCC)    "right" (03/20/2012)   Cardiomyopathy    Chest pain 06/2005   Hospitalized, dystolic dysfunction   Chronic lower back pain    "have 2 herniated discs; going to have to have a fusion" (03/20/2012)   CKD (chronic kidney disease), stage III (HCC)    Family history of anesthesia complication    "father has nausea and vomiting"   Gallstones    GERD (gastroesophageal reflux disease)    H/O hiatal hernia    History of kidney stones    History of right mastectomy    Hypertension    "patient states never had HTN, takes Hyzaar for heart   Hypothyroidism    Irritable bowel syndrome    Migraines    "it's been a long time since I've had one" (03/20/2012)   Osteoarthritis    Peripheral neuropathy    PONV (postoperative nausea and vomiting)    Sciatica    Sinus bradycardia    Sleep apnea    mild, no CPAP   Type II diabetes mellitus (HCC)     Past Surgical History:  Procedure Laterality Date   ABDOMINAL HYSTERECTOMY  1993   adenosin cardiolite  07/2005   (+) wall motion abnormality   AXILLARY LYMPH NODE DISSECTION  2003   squamous cell cancer   BACK SURGERY     CARDIAC CATHETERIZATION  ? 2005 / 2007   CHOLECYSTECTOMY N/A 01/24/2018   Procedure: LAPAROSCOPIC CHOLECYSTECTOMY;  Surgeon: Harriette Bouillon, MD;  Location: [facility] OR;  Service: General;  Laterality: N/A;   CT abdomen and pelvis  02/2010   Same   ESOPHAGOGASTRODUODENOSCOPY  2005   GERD    KNEE ARTHROSCOPY  1990's   "right" (03/20/2012)   LUMBAR FUSION N/A 08/22/2014   Procedure: Right L2-3 and right L1-2 transforaminal lumbar interbody fusion with pedicle screws, rods, sleeves, cages, local bone graft, Vivigen, cancellous chips;  Surgeon: Kerrin Champagne, MD;  Location: [facility] OR;  Service: Orthopedics;  Laterality: N/A;   LUMBAR LAMINECTOMY/DECOMPRESSION MICRODISCECTOMY N/A 02/04/2014   Procedure: RIGHT L2-3 MICRODISCECTOMY ;  Surgeon: Kerrin Champagne, MD;  Location: [facility] OR;  Service: Orthopedics;  Laterality: N/A;   MASTECTOMY WITH AXILLARY LYMPH NODE DISSECTION  12/2003   "right" (03/20/2012)   PARATHYROIDECTOMY Left 10/11/2022   Procedure: MINIMALLY INVASIVE LEFT INFERIOR PARATHYROIDECTOMY;  Surgeon: Axel Filler, MD;  Location: [facility] OR;  Service: General;  Laterality: Left;   POSTERIOR CERVICAL FUSION/FORAMINOTOMY  2001   SHOULDER ARTHROSCOPY W/ ROTATOR CUFF REPAIR  2003   Left   THYROIDECTOMY  1977   Right   TOTAL KNEE ARTHROPLASTY  2000   Right   [place] of abdomen  07/2005   Fatty liver / kidney stones    Social History   Socioeconomic History   Marital status: Married    Spouse name: Not on file   Number of children: 1   Years of education: Not on file   Highest education level: Not on file  Occupational History   Occupation: Retired since 2003  Tobacco Use   Smoking status: Never   Smokeless tobacco: Never  Vaping Use   Vaping status: Never Used  Substance and Sexual Activity   Alcohol use: No   Drug use: No   Sexual activity: Not Currently  Other Topics Concern   Not on file  Social History Narrative   Not on file   Social Determinants of Health   Financial Resource Strain: Medium Risk (01/05/2023)   Overall Financial Resource Strain (CARDIA)    Difficulty of Paying Living Expenses: Somewhat hard  Food Insecurity: No Food Insecurity (01/05/2023)   Hunger Vital Sign    Worried About Running Out of Food in the Last Year: Never true    Ran Out of Food  in the Last Year: Never true  Transportation Needs: No Transportation Needs (01/05/2023)   PRAPARE - Administrator, Civil Service (Medical): No    Lack of Transportation (Non-Medical): No  Physical Activity: Inactive (01/05/2023)   Exercise Vital Sign    Days of Exercise per Week: 0 days    Minutes of Exercise per Session: 0 min  Stress: No Stress Concern Present (01/05/2023)   [employer] of Occupational Health - Occupational Stress Questionnaire    Feeling of Stress : Not at all  Social Connections: Moderately Integrated (01/05/2023)   Social Connection and Isolation Panel [NHANES]    Frequency of Communication with Friends and Family: More than three times a week    Frequency of Social Gatherings with Friends and Family: Three times a week    Attends Religious Services: More than 4 times per year    Active Member of Clubs or Organizations: No    Attends Banker Meetings: Never    Marital Status: Married  Catering manager Violence: Not At Risk (01/05/2023)   Humiliation, Afraid, Rape, and Kick questionnaire    Fear of Current or Ex-Partner: No    Emotionally Abused: No    Physically Abused: No    Sexually Abused: No    Family History  Problem Relation Age of Onset   Hypertension Mother    Breast cancer Mother 74   Diabetes Father    Hypertension Father    Hypertension Sister    Hypertension Brother    Hypertension Brother    Diabetes Brother    Diabetes Brother    Diabetes Other        Family Hx. of Diabetes   Hypertension Other        Family History of HTN   Deep vein thrombosis Daughter    Breast cancer Maternal Aunt    Colon  cancer Neg Hx    Esophageal cancer Neg Hx    Liver cancer Neg Hx    Rectal cancer Neg Hx    Stomach cancer Neg Hx     ROS: no fevers or chills, productive cough, hemoptysis, dysphasia, odynophagia, melena, hematochezia, dysuria, hematuria, rash, seizure activity, orthopnea, PND, pedal edema, claudication.  Remaining systems are negative.  Physical Exam: Well-developed well-nourished in no acute distress.  Skin is warm and dry.  HEENT is normal.  Neck is supple.  Chest is clear to auscultation with normal expansion.  Cardiovascular exam is regular rate and rhythm.  Abdominal exam nontender or distended. No masses palpated. Extremities show no edema. neuro grossly intact  EKG Interpretation Date/Time:  Friday January 13 2023 11:31:22 EDT Ventricular Rate:  47 PR Interval:  212 QRS Duration:  100 QT Interval:  452 QTC Calculation: 400 R Axis:   -27  Text Interpretation: Sinus bradycardia with 1st degree A-V block Moderate voltage criteria for LVH, may be normal variant ( R in aVL , Cornell product ) Possible Anterior infarct , age undetermined When compared with ECG of 20-Jul-2022 17:44, PREVIOUS ECG IS PRESENT Confirmed by Olga Millers ([id number]) on 01/13/2023 11:34:56 AM    A/P  1 coronary calcification-most recent nuclear study showed no ischemia.  LV function normal on echocardiogram.  Continue aspirin and statin.  2 history of dilated ascending aorta-minimal dilatation on most recent echo; will repeat echo 4/25,  3 hypertension-patient's blood pressure is elevated; she is only taking hydralazine once daily; will increase to 50 mg TID and adjust based on FU readings.   4 chronic renal insufficiency-followed by nephrology.  5 CP-symptoms are atypical; recent nuclear study showed no ischemia; continue observation for now.   Olga Millers, MD

## 2023-01-05 ENCOUNTER — Ambulatory Visit (INDEPENDENT_AMBULATORY_CARE_PROVIDER_SITE_OTHER): Payer: Medicare HMO | Admitting: *Deleted

## 2023-01-05 DIAGNOSIS — Z0000 Encounter for general adult medical examination without abnormal findings: Secondary | ICD-10-CM

## 2023-01-05 DIAGNOSIS — E2839 Other primary ovarian failure: Secondary | ICD-10-CM

## 2023-01-05 NOTE — Patient Instructions (Signed)
[name] , Thank you for taking time to come for your Medicare Wellness Visit. I appreciate your ongoing commitment to your health goals. Please review the following plan we discussed and let me know if I can assist you in the future.   Screening recommendations/referrals: Colonoscopy: up tod ate Mammogram: scheduled Bone Density: Education provided Recommended yearly ophthalmology/optometry visit for glaucoma screening and checkup Recommended yearly dental visit for hygiene and checkup  Vaccinations: Influenza vaccine: up to date Pneumococcal vaccine: up to date Tdap vaccine: up to date Shingles vaccine:     Advanced directives: yes not on file     Preventive Care 65 Years and Older, Female Preventive care refers to lifestyle choices and visits with your health care provider that can promote health and wellness. What does preventive care include? A yearly physical exam. This is also called an annual well check. Dental exams once or twice a year. Routine eye exams. Ask your health care provider how often you should have your eyes checked. Personal lifestyle choices, including: Daily care of your teeth and gums. Regular physical activity. Eating a healthy diet. Avoiding tobacco and drug use. Limiting alcohol use. Practicing safe sex. Taking low-dose aspirin every day. Taking vitamin and mineral supplements as recommended by your health care provider. What happens during an annual well check? The services and screenings done by your health care provider during your annual well check will depend on your age, overall health, lifestyle risk factors, and family history of disease. Counseling  Your health care provider may ask you questions about your: Alcohol use. Tobacco use. Drug use. Emotional well-being. Home and relationship well-being. Sexual activity. Eating habits. History of falls. Memory and ability to understand (cognition). Work and work  Astronomer. Reproductive health. Screening  You may have the following tests or measurements: Height, weight, and BMI. Blood pressure. Lipid and cholesterol levels. These may be checked every 5 years, or more frequently if you are over 108 years old. Skin check. Lung cancer screening. You may have this screening every year starting at age 33 if you have a 30-pack-year history of smoking and currently smoke or have quit within the past 15 years. Fecal occult blood test (FOBT) of the stool. You may have this test every year starting at age 63. Flexible sigmoidoscopy or colonoscopy. You may have a sigmoidoscopy every 5 years or a colonoscopy every 10 years starting at age 20. Hepatitis C blood test. Hepatitis B blood test. Sexually transmitted disease (STD) testing. Diabetes screening. This is done by checking your blood sugar (glucose) after you have not eaten for a while (fasting). You may have this done every 1-3 years. Bone density scan. This is done to screen for osteoporosis. You may have this done starting at age 77. Mammogram. This may be done every 1-2 years. Talk to your health care provider about how often you should have regular mammograms. Talk with your health care provider about your test results, treatment options, and if necessary, the need for more tests. Vaccines  Your health care provider may recommend certain vaccines, such as: Influenza vaccine. This is recommended every year. Tetanus, diphtheria, and acellular pertussis (Tdap, Td) vaccine. You may need a Td booster every 10 years. Zoster vaccine. You may need this after age 62. Pneumococcal 13-valent conjugate (PCV13) vaccine. One dose is recommended after age 77. Pneumococcal polysaccharide (PPSV23) vaccine. One dose is recommended after age 52. Talk to your health care provider about which screenings and vaccines you need and how often you  need them. This information is not intended to replace advice given to you by  your health care provider. Make sure you discuss any questions you have with your health care provider. Document Released: 04/24/2015 Document Revised: 12/16/2015 Document Reviewed: 01/27/2015 Elsevier Interactive Patient Education  2017 [employer].  Fall Prevention in the Home Falls can cause injuries. They can happen to people of all ages. There are many things you can do to make your home safe and to help prevent falls. What can I do on the outside of my home? Regularly fix the edges of walkways and driveways and fix any cracks. Remove anything that might make you trip as you walk through a door, such as a raised step or threshold. Trim any bushes or trees on the path to your home. Use bright outdoor lighting. Clear any walking paths of anything that might make someone trip, such as rocks or tools. Regularly check to see if handrails are loose or broken. Make sure that both sides of any steps have handrails. Any raised decks and porches should have guardrails on the edges. Have any leaves, snow, or ice cleared regularly. Use sand or salt on walking paths during winter. Clean up any spills in your garage right away. This includes oil or grease spills. What can I do in the bathroom? Use night lights. Install grab bars by the toilet and in the tub and shower. Do not use towel bars as grab bars. Use non-skid mats or decals in the tub or shower. If you need to sit down in the shower, use a plastic, non-slip stool. Keep the floor dry. Clean up any water that spills on the floor as soon as it happens. Remove soap buildup in the tub or shower regularly. Attach bath mats securely with double-sided non-slip rug tape. Do not have throw rugs and other things on the floor that can make you trip. What can I do in the bedroom? Use night lights. Make sure that you have a light by your bed that is easy to reach. Do not use any sheets or blankets that are too big for your bed. They should not hang  down onto the floor. Have a firm chair that has side arms. You can use this for support while you get dressed. Do not have throw rugs and other things on the floor that can make you trip. What can I do in the kitchen? Clean up any spills right away. Avoid walking on wet floors. Keep items that you use a lot in easy-to-reach places. If you need to reach something above you, use a strong step stool that has a grab bar. Keep electrical cords out of the way. Do not use floor polish or wax that makes floors slippery. If you must use wax, use non-skid floor wax. Do not have throw rugs and other things on the floor that can make you trip. What can I do with my stairs? Do not leave any items on the stairs. Make sure that there are handrails on both sides of the stairs and use them. Fix handrails that are broken or loose. Make sure that handrails are as long as the stairways. Check any carpeting to make sure that it is firmly attached to the stairs. Fix any carpet that is loose or worn. Avoid having throw rugs at the top or bottom of the stairs. If you do have throw rugs, attach them to the floor with carpet tape. Make sure that you have a light switch  at the top of the stairs and the bottom of the stairs. If you do not have them, ask someone to add them for you. What else can I do to help prevent falls? Wear shoes that: Do not have high heels. Have rubber bottoms. Are comfortable and fit you well. Are closed at the toe. Do not wear sandals. If you use a stepladder: Make sure that it is fully opened. Do not climb a closed stepladder. Make sure that both sides of the stepladder are locked into place. Ask someone to hold it for you, if possible. Clearly mark and make sure that you can see: Any grab bars or handrails. First and last steps. Where the edge of each step is. Use tools that help you move around (mobility aids) if they are needed. These  include: Canes. Walkers. Scooters. Crutches. Turn on the lights when you go into a dark area. Replace any light bulbs as soon as they burn out. Set up your furniture so you have a clear path. Avoid moving your furniture around. If any of your floors are uneven, fix them. If there are any pets around you, be aware of where they are. Review your medicines with your doctor. Some medicines can make you feel dizzy. This can increase your chance of falling. Ask your doctor what other things that you can do to help prevent falls. This information is not intended to replace advice given to you by your health care provider. Make sure you discuss any questions you have with your health care provider. Document Released: 01/22/2009 Document Revised: 09/03/2015 Document Reviewed: 05/02/2014 Elsevier Interactive Patient Education  2017 [employer].

## 2023-01-05 NOTE — Progress Notes (Signed)
Subjective:   [name] is a 72 y.o. female who presents for Medicare Annual (Subsequent) preventive examination.  Visit Complete: Virtual  I connected with  Ulyess Mort on 01/05/23 by a audio enabled telemedicine application and verified that I am speaking with the correct person using two identifiers.  Patient Location: Home  Provider Location: Home Office  I discussed the limitations of evaluation and management by telemedicine. The patient expressed understanding and agreed to proceed.  Because this visit was a virtual/telehealth visit, some criteria may be missing or patient reported. Any vitals not documented were not able to be obtained and vitals that have been documented are patient reported.    Cardiac Risk Factors include: advanced age (>33men, >52 women);diabetes mellitus;hypertension     Objective:    There were no vitals filed for this visit. There is no height or weight on file to calculate BMI.     01/05/2023    3:06 PM 10/11/2022    6:43 PM 10/11/2022    1:19 PM 10/04/2022    3:27 PM 07/21/2022    1:00 PM 07/20/2022    7:05 PM 03/01/2022   12:13 PM  Advanced Directives  Does Patient Have a Medical Advance Directive? Yes Yes Yes Yes Yes Yes Yes  Type of Estate agent of [employer] Power of [place];Living will Healthcare Power of [place];Living will Healthcare Power of [place];Living will Living will    Does patient want to make changes to medical advance directive?  No - Patient declined  No - Patient declined No - Patient declined    Copy of Healthcare Power of Attorney in Chart? No - copy requested No - copy requested No - copy requested No - copy requested     Would patient like information on creating a medical advance directive?  No - Patient declined         Current Medications (verified) Outpatient Encounter Medications as of 01/05/2023  Medication Sig   acetaminophen (TYLENOL) 500 MG tablet Take 1,000 mg by  mouth every 6 (six) hours as needed for moderate pain, headache or mild pain.   Alcohol Swabs (B-D SINGLE USE SWABS REGULAR) PADS Use as directed.   amLODipine (NORVASC) 10 MG tablet Take 1 tablet (10 mg total) by mouth daily.   aspirin 81 MG tablet Take 81 mg by mouth daily.   calcium carbonate (TUMS - DOSED IN MG ELEMENTAL CALCIUM) 500 MG chewable tablet Chew 4 tablets (800 mg of elemental calcium total) by mouth 3 (three) times daily.   cetirizine (ZYRTEC) 10 MG tablet Take 1 tablet (10 mg total) by mouth daily.   Cholecalciferol (VITAMIN D) 50 MCG (2000 UT) tablet Take 2,000 Units by mouth daily.   cyanocobalamin 2000 MCG tablet Take 2,000 mcg by mouth daily.   dapagliflozin propanediol (FARXIGA) 10 MG TABS tablet Take 10 mg by mouth daily.   famotidine (PEPCID) 40 MG tablet TAKE 1 TABLET BY MOUTH EVERY DAY   hydrALAZINE (APRESOLINE) 50 MG tablet TAKE 1 TABLET BY MOUTH TWICE A DAY   insulin NPH-regular Human (NOVOLIN 70/30) (70-30) 100 UNIT/ML injection Inject 25 Units into the skin 2 (two) times daily with a meal.   levothyroxine (SYNTHROID) 100 MCG tablet Take 1 tablet (100 mcg total) by mouth daily.   losartan (COZAAR) 25 MG tablet Take 1 tablet (25 mg total) by mouth daily.   metoCLOPramide (REGLAN) 10 MG tablet TAKE 1 TABLET FOUR TIMES DAILY   Omega-3 Fatty Acids (OMEGA 3 PO)  Take 2 capsules by mouth daily. Omega XL supplement   pantoprazole (PROTONIX) 40 MG tablet Take 1 tablet (40 mg total) by mouth 2 (two) times daily.   Polyethyl Glycol-Propyl Glycol (SYSTANE ULTRA) 0.4-0.3 % SOLN Apply 1-2 drops to eye daily as needed (dry eyes).   polyethylene glycol (MIRALAX / GLYCOLAX) packet Take 17 g by mouth daily as needed for moderate constipation (MIX AND DRINK).   rosuvastatin (CRESTOR) 20 MG tablet TAKE 1 TABLET AT BEDTIME   SUMAtriptan (IMITREX) 50 MG tablet TAKE 1 TAB EVERY 2 HRS AS NEEDED FOR MIGRAINE. MAY REPEAT IN 2 HOURS IF HEADACHE PERSISTS OR RECURS. (Patient taking differently:  Take 50 mg by mouth every 2 (two) hours as needed for migraine (May repeat in 2 hours if headache persists or recurs.).)   torsemide (DEMADEX) 20 MG tablet Take 1 tablet (20 mg total) by mouth 2 (two) times daily. (Patient taking differently: Take 20 mg by mouth 2 (two) times daily as needed (swelling).)   traMADol (ULTRAM) 50 MG tablet Take 1 tablet (50 mg total) by mouth every 6 (six) hours as needed (mild pain).   Vitamin D, Ergocalciferol, (DRISDOL) 1.25 MG (50000 UNIT) CAPS capsule Take 1 capsule (50,000 Units total) by mouth every 7 (seven) days.   Blood Glucose Monitoring Suppl (TRUE METRIX AIR GLUCOSE METER) w/Device KIT 1 kit by Does not apply route 3 (three) times daily. (Patient not taking: Reported on 11/01/2022)   glucose blood (TRUE METRIX BLOOD GLUCOSE TEST) test strip Use as instructed (Patient not taking: Reported on 11/01/2022)   magnesium oxide (MAG-OX) 400 (240 Mg) MG tablet Take 1 tablet (400 mg total) by mouth 2 (two) times daily. (Patient not taking: Reported on 10/24/2022)   TRUEplus Lancets 33G MISC Use as directed (Patient not taking: Reported on 11/22/2022)   No facility-administered encounter medications on file as of 01/05/2023.    Allergies (verified) Dilaudid [hydromorphone hcl], Lipitor [atorvastatin], Adhesive [tape], Ampicillin, Latex, and Penicillins   History: Past Medical History:  Diagnosis Date   Anginal pain (HCC) 03/19/2012   saw Dr. Melburn Popper .. she thinks its more related to stomach issues   Arthritis    "all over; mostly in my back" (03/20/2012)   Breast cancer (HCC)    "right" (03/20/2012)   Cardiomyopathy    Chest pain 06/2005   Hospitalized, dystolic dysfunction   Chronic lower back pain    "have 2 herniated discs; going to have to have a fusion" (03/20/2012)   CKD (chronic kidney disease), stage III (HCC)    Family history of anesthesia complication    "father has nausea and vomiting"   Gallstones    GERD (gastroesophageal reflux disease)     H/O hiatal hernia    History of kidney stones    History of right mastectomy    Hypertension    "patient states never had HTN, takes Hyzaar for heart   Hypothyroidism    Irritable bowel syndrome    Migraines    "it's been a long time since I've had one" (03/20/2012)   Osteoarthritis    Peripheral neuropathy    PONV (postoperative nausea and vomiting)    Sciatica    Sinus bradycardia    Sleep apnea    mild, no CPAP   Type II diabetes mellitus (HCC)    Past Surgical History:  Procedure Laterality Date   ABDOMINAL HYSTERECTOMY  1993   adenosin cardiolite  07/2005   (+) wall motion abnormality   AXILLARY LYMPH NODE DISSECTION  2003   squamous cell cancer   BACK SURGERY     CARDIAC CATHETERIZATION  ? 2005 / 2007   CHOLECYSTECTOMY N/A 01/24/2018   Procedure: LAPAROSCOPIC CHOLECYSTECTOMY;  Surgeon: Harriette Bouillon, MD;  Location: [facility] OR;  Service: General;  Laterality: N/A;   CT abdomen and pelvis  02/2010   Same   ESOPHAGOGASTRODUODENOSCOPY  2005   GERD   KNEE ARTHROSCOPY  1990's   "right" (03/20/2012)   LUMBAR FUSION N/A 08/22/2014   Procedure: Right L2-3 and right L1-2 transforaminal lumbar interbody fusion with pedicle screws, rods, sleeves, cages, local bone graft, Vivigen, cancellous chips;  Surgeon: Kerrin Champagne, MD;  Location: [facility] OR;  Service: Orthopedics;  Laterality: N/A;   LUMBAR LAMINECTOMY/DECOMPRESSION MICRODISCECTOMY N/A 02/04/2014   Procedure: RIGHT L2-3 MICRODISCECTOMY ;  Surgeon: Kerrin Champagne, MD;  Location: [facility] OR;  Service: Orthopedics;  Laterality: N/A;   MASTECTOMY WITH AXILLARY LYMPH NODE DISSECTION  12/2003   "right" (03/20/2012)   PARATHYROIDECTOMY Left 10/11/2022   Procedure: MINIMALLY INVASIVE LEFT INFERIOR PARATHYROIDECTOMY;  Surgeon: Axel Filler, MD;  Location: [facility] OR;  Service: General;  Laterality: Left;   POSTERIOR CERVICAL FUSION/FORAMINOTOMY  2001   SHOULDER ARTHROSCOPY W/ ROTATOR CUFF REPAIR  2003   Left   THYROIDECTOMY  1977   Right    TOTAL KNEE ARTHROPLASTY  2000   Right   [place] of abdomen  07/2005   Fatty liver / kidney stones   Family History  Problem Relation Age of Onset   Hypertension Mother    Breast cancer Mother 64   Diabetes Father    Hypertension Father    Hypertension Sister    Hypertension Brother    Hypertension Brother    Diabetes Brother    Diabetes Brother    Diabetes Other        Family Hx. of Diabetes   Hypertension Other        Family History of HTN   Deep vein thrombosis Daughter    Breast cancer Maternal Aunt    Colon cancer Neg Hx    Esophageal cancer Neg Hx    Liver cancer Neg Hx    Rectal cancer Neg Hx    Stomach cancer Neg Hx    Social History   Socioeconomic History   Marital status: Married    Spouse name: Not on file   Number of children: 1   Years of education: Not on file   Highest education level: Not on file  Occupational History   Occupation: Retired since 2003  Tobacco Use   Smoking status: Never   Smokeless tobacco: Never  Vaping Use   Vaping status: Never Used  Substance and Sexual Activity   Alcohol use: No   Drug use: No   Sexual activity: Not Currently  Other Topics Concern   Not on file  Social History Narrative   Not on file   Social Determinants of Health   Financial Resource Strain: Medium Risk (01/05/2023)   Overall Financial Resource Strain (CARDIA)    Difficulty of Paying Living Expenses: Somewhat hard  Food Insecurity: No Food Insecurity (01/05/2023)   Hunger Vital Sign    Worried About Running Out of Food in the Last Year: Never true    Ran Out of Food in the Last Year: Never true  Transportation Needs: No Transportation Needs (01/05/2023)   PRAPARE - Administrator, Civil Service (Medical): No    Lack of Transportation (Non-Medical): No  Physical Activity: Inactive (01/05/2023)  Exercise Vital Sign    Days of Exercise per Week: 0 days    Minutes of Exercise per Session: 0 min  Stress: No Stress Concern Present (01/05/2023)    [employer] of Occupational Health - Occupational Stress Questionnaire    Feeling of Stress : Not at all  Social Connections: Moderately Integrated (01/05/2023)   Social Connection and Isolation Panel [NHANES]    Frequency of Communication with Friends and Family: More than three times a week    Frequency of Social Gatherings with Friends and Family: Three times a week    Attends Religious Services: More than 4 times per year    Active Member of Clubs or Organizations: No    Attends Banker Meetings: Never    Marital Status: Married    Tobacco Counseling Counseling given: Not Answered   Clinical Intake:  Pre-visit preparation completed: Yes  Pain : No/denies pain     Diabetes: Yes CBG done?: No Did pt. bring in CBG monitor from home?: No  How often do you need to have someone help you when you read instructions, pamphlets, or other written materials from your doctor or pharmacy?: 1 - Never  Interpreter Needed?: No  Information entered by :: Remi Haggard LPN   Activities of Daily Living    01/05/2023    3:06 PM 10/11/2022    6:46 PM  In your present state of health, do you have any difficulty performing the following activities:  Hearing? 1   Vision? 0   Difficulty concentrating or making decisions? 0   Walking or climbing stairs? 1   Dressing or bathing? 0   Doing errands, shopping? 1 0  Preparing Food and eating ? Y   Using the Toilet? N   In the past six months, have you accidently leaked urine? Y   Do you have problems with loss of bowel control? N   Managing your Medications? N   Managing your Finances? Y   Housekeeping or managing your Housekeeping? Y     Patient Care Team: Sheliah Hatch, MD as PCP - General (Family Medicine) Jens Som Madolyn Frieze, MD as PCP - Cardiology (Cardiology) Roma Kayser, MD as Consulting Physician (Endocrinology) Vivi Barrack, DPM as Consulting Physician (Podiatry) Waymon Budge, MD as  Consulting Physician (Pulmonary Disease) Shon Millet, MD as Consulting Physician (Ophthalmology) Estanislado Emms, MD as Consulting Physician (Nephrology) Erroll Luna, [facility] (Inactive) (Pharmacist)  Indicate any recent Medical Services you may have received from other than [facility] providers in the past year (date may be approximate).     Assessment:   This is a routine wellness examination for [place].  Hearing/Vision screen Hearing Screening - Comments:: Some trouble hearing No hearing aids Vision Screening - Comments:: Up to date [place]   Goals Addressed             This Visit's Progress    Patient Stated       Be able to walk better       Depression Screen    01/05/2023    3:11 PM 10/24/2022   11:07 AM 07/25/2022   10:57 AM 06/14/2022   10:50 AM 03/17/2022   11:04 AM 02/17/2022    1:29 PM 01/06/2022   12:44 PM  PHQ 2/9 Scores  PHQ - 2 Score 2 2 0 1 3 6  0  PHQ- 9 Score 7 7 0 10 6 18      Fall Risk    01/05/2023  3:03 PM 10/24/2022   11:07 AM 07/25/2022   10:57 AM 06/14/2022   10:50 AM 03/17/2022   11:04 AM  Fall Risk   Falls in the past year? 1 0 1 0 0  Number falls in past yr: 0 0 0 0   Injury with Fall? 1 0 1 0   Risk for fall due to : Impaired balance/gait No Fall Risks History of fall(s) No Fall Risks No Fall Risks  Follow up Falls evaluation completed;Education provided;Falls prevention discussed Falls evaluation completed Falls evaluation completed Falls evaluation completed Falls evaluation completed    MEDICARE RISK AT HOME: Medicare Risk at Home Any stairs in or around the home?: No If so, are there any without handrails?: No Home free of loose throw rugs in walkways, pet beds, electrical cords, etc?: Yes Adequate lighting in your home to reduce risk of falls?: Yes Life alert?: No Use of a cane, walker or w/c?: Yes Grab bars in the bathroom?: Yes Shower chair or bench in shower?: Yes Elevated toilet seat or a handicapped toilet?: No  TIMED UP  AND GO:  Was the test performed?  No    Cognitive Function:        01/05/2023    3:12 PM 12/23/2021    2:31 PM 06/26/2019   10:03 AM  6CIT Screen  What Year? 0 points 0 points 0 points  What month? 0 points 0 points 0 points  What time? 0 points 0 points 0 points  Count back from 20 0 points 0 points 0 points  Months in reverse 0 points 0 points 0 points  Repeat phrase 0 points 2 points 0 points  Total Score 0 points 2 points 0 points    Immunizations Immunization History  Administered Date(s) Administered   Fluad Quad(high Dose 65+) 12/13/2019, 02/01/2021, 01/06/2022   Influenza Split 01/31/2011   Influenza Whole 02/23/2007   Influenza, High Dose Seasonal PF 01/10/2017   Influenza,inj,Quad PF,6+ Mos 01/07/2014, 02/09/2016, 01/01/2018, 12/03/2018   Influenza-Unspecified 01/10/2012   PFIZER Comirnaty(Gray Top)Covid-19 Tri-Sucrose Vaccine 05/24/2019, 06/18/2019, 01/11/2020, 08/15/2020   PFIZER(Purple Top)SARS-COV-2 Vaccination 05/24/2019, 06/18/2019, 01/11/2020   Pfizer(Comirnaty)Fall Seasonal Vaccine 12 years and older 01/02/2023   Pneumococcal Conjugate-13 05/16/2019   Pneumococcal Polysaccharide-23 08/25/2014   Tdap 04/20/2011, 07/20/2022    TDAP status: Due, Education has been provided regarding the importance of this vaccine. Advised may receive this vaccine at local pharmacy or Health Dept. Aware to provide a copy of the vaccination record if obtained from local pharmacy or Health Dept. Verbalized acceptance and understanding.  Flu Vaccine status: Up to date  Pneumococcal vaccine status: Up to date  Covid-19 vaccine status: Information provided on how to obtain vaccines.   Qualifies for Shingles Vaccine? Yes   Zostavax completed No   Shingrix Completed?: No.    Education has been provided regarding the importance of this vaccine. Patient has been advised to call insurance company to determine out of pocket expense if they have not yet received this vaccine. Advised  may also receive vaccine at local pharmacy or Health Dept. Verbalized acceptance and understanding.  Screening Tests Health Maintenance  Topic Date Due   Diabetic kidney evaluation - Urine ACR  08/04/2020   Medicare Annual Wellness (AWV)  12/24/2022   Hepatitis C Screening  01/07/2023 (Originally 09/29/1968)   MAMMOGRAM  12/20/2023 (Originally 08/25/2021)   HEMOGLOBIN A1C  04/05/2023   OPHTHALMOLOGY EXAM  05/05/2023   FOOT EXAM  06/14/2023   Colonoscopy  11/22/2023   Diabetic kidney evaluation -  eGFR measurement  12/23/2023   Pneumonia Vaccine 17+ Years old  Completed   INFLUENZA VACCINE  Completed   DEXA SCAN  Completed   HPV VACCINES  Aged Out   DTaP/Tdap/Td  Discontinued   COVID-19 Vaccine  Discontinued   Zoster Vaccines- Shingrix  Discontinued    Health Maintenance  Health Maintenance Due  Topic Date Due   Diabetic kidney evaluation - Urine ACR  08/04/2020   Medicare Annual Wellness (AWV)  12/24/2022    Colorectal cancer screening: Type of screening: Colonoscopy. Completed 2024. Repeat every 1 years  Mammogram will schedule  Bone Density status: Ordered  . Pt provided with contact info and advised to call to schedule appt.  Lung Cancer Screening: (Low Dose CT Chest recommended if Age 78-80 years, 20 pack-year currently smoking OR have quit w/in 15years.) does not qualify.   Lung Cancer Screening Referral:   Additional Screening:  Hepatitis C Screening  never done  Vision Screening: Recommended annual ophthalmology exams for early detection of glaucoma and other disorders of the eye. Is the patient up to date with their annual eye exam?  Yes  Who is the provider or what is the name of the office in which the patient attends annual eye exams? [facility] If pt is not established with a provider, would they like to be referred to a provider to establish care? No .   Dental Screening: Recommended annual dental exams for proper oral hygiene  Nutrition Risk  Assessment:  Has the patient had any N/V/D within the last 2 months?  No  Does the patient have any non-healing wounds?  No  Has the patient had any unintentional weight loss or weight gain?  No   Diabetes:  Is the patient diabetic?  Yes  If diabetic, was a CBG obtained today?  No  Did the patient bring in their glucometer from home?  No  How often do you monitor your CBG's? All day has monitoring system   Financial Strains and Diabetes Management:  Are you having any financial strains with the device, your supplies or your medication? yes Does the patient want to be seen by Chronic Care Management for management of their diabetes?  No  Would the patient like to be referred to a Nutritionist or for Diabetic Management?  No   Patient is established with pharmacy given number to call she was getting farxiga for free and is now paying.   Will reach out to MD if unable to resolve  Diabetic Exams:  Diabetic Eye Exam: Completed . Overdue for diabetic eye exam. Pt has been advised about the importance in completing this exam.   Diabetic Foot Exam: . Pt has been advised about the importance in completing this exam.    Community Resource Referral / Chronic Care Management: CRR required this visit?  No   CCM required this visit?  No     Plan:     I have personally reviewed and noted the following in the patient's chart:   Medical and social history Use of alcohol, tobacco or illicit drugs  Current medications and supplements including opioid prescriptions. Patient is not currently taking opioid prescriptions. Functional ability and status Nutritional status Physical activity Advanced directives List of other physicians Hospitalizations, surgeries, and ER visits in previous 12 months Vitals Screenings to include cognitive, depression, and falls Referrals and appointments  In addition, I have reviewed and discussed with patient certain preventive protocols, quality metrics,  and best practice recommendations. A written personalized care  plan for preventive services as well as general preventive health recommendations were provided to patient.     Remi Haggard, LPN   [id number]   After Visit Summary: (MyChart) Due to this being a telephonic visit, the after visit summary with patients personalized plan was offered to patient via MyChart   Nurse Notes:

## 2023-01-13 ENCOUNTER — Encounter: Payer: Self-pay | Admitting: Cardiology

## 2023-01-13 ENCOUNTER — Ambulatory Visit: Payer: Medicare HMO | Attending: Cardiology | Admitting: Cardiology

## 2023-01-13 VITALS — BP 170/80 | HR 51 | Ht 64.0 in | Wt 185.0 lb

## 2023-01-13 DIAGNOSIS — I712 Thoracic aortic aneurysm, without rupture, unspecified: Secondary | ICD-10-CM | POA: Diagnosis not present

## 2023-01-13 DIAGNOSIS — R079 Chest pain, unspecified: Secondary | ICD-10-CM

## 2023-01-13 DIAGNOSIS — I10 Essential (primary) hypertension: Secondary | ICD-10-CM | POA: Diagnosis not present

## 2023-01-13 DIAGNOSIS — I5032 Chronic diastolic (congestive) heart failure: Secondary | ICD-10-CM

## 2023-01-13 MED ORDER — HYDRALAZINE HCL 50 MG PO TABS
50.0000 mg | ORAL_TABLET | Freq: Three times a day (TID) | ORAL | 3 refills | Status: AC
Start: 2023-01-13 — End: ?

## 2023-01-13 NOTE — Patient Instructions (Signed)
Medication Instructions:   INCREASE HYDRALAZINE TO 50 MG THREE TIMES DAILY  *If you need a refill on your cardiac medications before your next appointment, please call your pharmacy*  Testing/Procedures:  Your physician has requested that you have an echocardiogram. Echocardiography is a painless test that uses sound waves to create images of your heart. It provides your doctor with information about the size and shape of your heart and how well your heart's chambers and valves are working. This procedure takes approximately one hour. There are no restrictions for this procedure. Please do NOT wear cologne, perfume, aftershave, or lotions (deodorant is allowed). Please arrive 15 minutes prior to your appointment time. [address] [place]   Follow-Up: At [facility], you and your health needs are our priority.  As part of our continuing mission to provide you with exceptional heart care, we have created designated Provider Care Teams.  These Care Teams include your primary Cardiologist (physician) and Advanced Practice Providers (APPs -  Physician Assistants and Nurse Practitioners) who all work together to provide you with the care you need, when you need it.  We recommend signing up for the patient portal called "MyChart".  Sign up information is provided on this After Visit Summary.  MyChart is used to connect with patients for Virtual Visits (Telemedicine).  Patients are able to view lab/test results, encounter notes, upcoming appointments, etc.  Non-urgent messages can be sent to your provider as well.   To learn more about what you can do with MyChart, go to [email or web address].    Your next appointment:   6 month(s)  Provider:   Olga Millers, MD

## 2023-01-25 ENCOUNTER — Other Ambulatory Visit: Payer: Self-pay | Admitting: Family Medicine

## 2023-01-27 DIAGNOSIS — Z1231 Encounter for screening mammogram for malignant neoplasm of breast: Secondary | ICD-10-CM | POA: Diagnosis not present

## 2023-01-27 LAB — HM MAMMOGRAPHY

## 2023-01-29 DIAGNOSIS — E1165 Type 2 diabetes mellitus with hyperglycemia: Secondary | ICD-10-CM | POA: Diagnosis not present

## 2023-01-30 ENCOUNTER — Encounter: Payer: Self-pay | Admitting: Family Medicine

## 2023-02-06 DIAGNOSIS — E890 Postprocedural hypothyroidism: Secondary | ICD-10-CM | POA: Diagnosis not present

## 2023-02-06 DIAGNOSIS — E1159 Type 2 diabetes mellitus with other circulatory complications: Secondary | ICD-10-CM | POA: Diagnosis not present

## 2023-02-06 DIAGNOSIS — N184 Chronic kidney disease, stage 4 (severe): Secondary | ICD-10-CM | POA: Diagnosis not present

## 2023-02-07 LAB — COMPREHENSIVE METABOLIC PANEL
ALT: 7 [IU]/L (ref 0–32)
AST: 22 [IU]/L (ref 0–40)
Albumin: 3.8 g/dL (ref 3.8–4.8)
Alkaline Phosphatase: 56 [IU]/L (ref 44–121)
BUN/Creatinine Ratio: 16 (ref 12–28)
BUN: 26 mg/dL (ref 8–27)
Bilirubin Total: 0.4 mg/dL (ref 0.0–1.2)
CO2: 18 mmol/L — ABNORMAL LOW (ref 20–29)
Calcium: 8.8 mg/dL (ref 8.7–10.3)
Chloride: 105 mmol/L (ref 96–106)
Creatinine, Ser: 1.64 mg/dL — ABNORMAL HIGH (ref 0.57–1.00)
Globulin, Total: 3.4 g/dL (ref 1.5–4.5)
Glucose: 168 mg/dL — ABNORMAL HIGH (ref 70–99)
Potassium: 4.8 mmol/L (ref 3.5–5.2)
Sodium: 141 mmol/L (ref 134–144)
Total Protein: 7.2 g/dL (ref 6.0–8.5)
eGFR: 33 mL/min/{1.73_m2} — ABNORMAL LOW (ref >59)

## 2023-02-07 LAB — TSH: TSH: 3.7 u[IU]/mL (ref 0.450–4.500)

## 2023-02-07 LAB — T4, FREE: Free T4: 1.14 ng/dL (ref 0.82–1.77)

## 2023-02-08 ENCOUNTER — Encounter: Payer: Self-pay | Admitting: Nurse Practitioner

## 2023-02-08 ENCOUNTER — Ambulatory Visit: Payer: Medicare HMO | Admitting: Nurse Practitioner

## 2023-02-08 VITALS — BP 142/78 | HR 56 | Ht 64.0 in | Wt 189.2 lb

## 2023-02-08 DIAGNOSIS — E1159 Type 2 diabetes mellitus with other circulatory complications: Secondary | ICD-10-CM | POA: Diagnosis not present

## 2023-02-08 DIAGNOSIS — Z7984 Long term (current) use of oral hypoglycemic drugs: Secondary | ICD-10-CM | POA: Diagnosis not present

## 2023-02-08 DIAGNOSIS — E039 Hypothyroidism, unspecified: Secondary | ICD-10-CM

## 2023-02-08 DIAGNOSIS — I10 Essential (primary) hypertension: Secondary | ICD-10-CM

## 2023-02-08 DIAGNOSIS — Z789 Other specified health status: Secondary | ICD-10-CM

## 2023-02-08 DIAGNOSIS — Z794 Long term (current) use of insulin: Secondary | ICD-10-CM | POA: Diagnosis not present

## 2023-02-08 DIAGNOSIS — E559 Vitamin D deficiency, unspecified: Secondary | ICD-10-CM | POA: Diagnosis not present

## 2023-02-08 DIAGNOSIS — E890 Postprocedural hypothyroidism: Secondary | ICD-10-CM

## 2023-02-08 LAB — POCT GLYCOSYLATED HEMOGLOBIN (HGB A1C): Hemoglobin A1C: 8.3 % — AB (ref 4.0–5.6)

## 2023-02-08 MED ORDER — NOVOLIN 70/30 (70-30) 100 UNIT/ML ~~LOC~~ SUSP: 25.0000 [IU] | Freq: Two times a day (BID) | SUBCUTANEOUS | Status: DC

## 2023-02-08 NOTE — Progress Notes (Signed)
02/08/2023, 11:39 AM   Endocrinology follow-up note   Subjective:    Patient ID: [name], female    DOB: [birth year].  [name] is being seen in follow-up for management of currently uncontrolled,complicated symptomatic type 2 diabetes and associated hypertension, hypothyroidism. PMD :  Sheliah Hatch, MD.   Past Medical History:  Diagnosis Date   Anginal pain (HCC) 03/19/2012   saw Dr. Melburn Popper .. she thinks its more related to stomach issues   Arthritis    "all over; mostly in my back" (03/20/2012)   Breast cancer (HCC)    "right" (03/20/2012)   Cardiomyopathy    Chest pain 06/2005   Hospitalized, dystolic dysfunction   Chronic lower back pain    "have 2 herniated discs; going to have to have a fusion" (03/20/2012)   CKD (chronic kidney disease), stage III (HCC)    Family history of anesthesia complication    "father has nausea and vomiting"   Gallstones    GERD (gastroesophageal reflux disease)    H/O hiatal hernia    History of kidney stones    History of right mastectomy    Hypertension    "patient states never had HTN, takes Hyzaar for heart   Hypothyroidism    Irritable bowel syndrome    Migraines    "it's been a long time since I've had one" (03/20/2012)   Osteoarthritis    Peripheral neuropathy    PONV (postoperative nausea and vomiting)    Sciatica    Sinus bradycardia    Sleep apnea    mild, no CPAP   Type II diabetes mellitus (HCC)    Past Surgical History:  Procedure Laterality Date   ABDOMINAL HYSTERECTOMY  1993   adenosin cardiolite  07/2005   (+) wall motion abnormality   AXILLARY LYMPH NODE DISSECTION  2003   squamous cell cancer   BACK SURGERY     CARDIAC CATHETERIZATION  ? 2005 / 2007   CHOLECYSTECTOMY N/A 01/24/2018   Procedure: LAPAROSCOPIC CHOLECYSTECTOMY;  Surgeon: Harriette Bouillon, MD;  Location: [facility] OR;  Service: General;  Laterality:  N/A;   CT abdomen and pelvis  02/2010   Same   ESOPHAGOGASTRODUODENOSCOPY  2005   GERD   KNEE ARTHROSCOPY  1990's   "right" (03/20/2012)   LUMBAR FUSION N/A 08/22/2014   Procedure: Right L2-3 and right L1-2 transforaminal lumbar interbody fusion with pedicle screws, rods, sleeves, cages, local bone graft, Vivigen, cancellous chips;  Surgeon: Kerrin Champagne, MD;  Location: [facility] OR;  Service: Orthopedics;  Laterality: N/A;   LUMBAR LAMINECTOMY/DECOMPRESSION MICRODISCECTOMY N/A 02/04/2014   Procedure: RIGHT L2-3 MICRODISCECTOMY ;  Surgeon: Kerrin Champagne, MD;  Location: [facility] OR;  Service: Orthopedics;  Laterality: N/A;   MASTECTOMY WITH AXILLARY LYMPH NODE DISSECTION  12/2003   "right" (03/20/2012)   PARATHYROIDECTOMY Left 10/11/2022   Procedure: MINIMALLY INVASIVE LEFT INFERIOR PARATHYROIDECTOMY;  Surgeon: Axel Filler, MD;  Location: [facility];  Service: General;  Laterality: Left;   POSTERIOR CERVICAL FUSION/FORAMINOTOMY  2001   SHOULDER ARTHROSCOPY W/ ROTATOR CUFF REPAIR  2003   Left   THYROIDECTOMY  1977   Right   TOTAL KNEE ARTHROPLASTY  2000   Right   [place] of abdomen  07/2005   Fatty liver / kidney stones   Social History   Socioeconomic History   Marital status: Married    Spouse name: Not on file   Number of children: 1   Years of education: Not on file   Highest education level: Not on file  Occupational History   Occupation: Retired since 2003  Tobacco Use   Smoking status: Never   Smokeless tobacco: Never  Vaping Use   Vaping status: Never Used  Substance and Sexual Activity   Alcohol use: No   Drug use: No   Sexual activity: Not Currently  Other Topics Concern   Not on file  Social History Narrative   Not on file   Social Determinants of Health   Financial Resource Strain: Medium Risk (01/05/2023)   Overall Financial Resource Strain (CARDIA)    Difficulty of Paying Living Expenses: Somewhat hard  Food Insecurity: No Food Insecurity (01/05/2023)   Hunger Vital Sign     Worried About Running Out of Food in the Last Year: Never true    Ran Out of Food in the Last Year: Never true  Transportation Needs: No Transportation Needs (01/05/2023)   PRAPARE - Administrator, Civil Service (Medical): No    Lack of Transportation (Non-Medical): No  Physical Activity: Inactive (01/05/2023)   Exercise Vital Sign    Days of Exercise per Week: 0 days    Minutes of Exercise per Session: 0 min  Stress: No Stress Concern Present (01/05/2023)   [employer] of Occupational Health - Occupational Stress Questionnaire    Feeling of Stress : Not at all  Social Connections: Moderately Integrated (01/05/2023)   Social Connection and Isolation Panel [NHANES]    Frequency of Communication with Friends and Family: More than three times a week    Frequency of Social Gatherings with Friends and Family: Three times a week    Attends Religious Services: More than 4 times per year    Active Member of Clubs or Organizations: No    Attends Banker Meetings: Never    Marital Status: Married   Outpatient Encounter Medications as of 02/08/2023  Medication Sig   acetaminophen (TYLENOL) 500 MG tablet Take 1,000 mg by mouth every 6 (six) hours as needed for moderate pain, headache or mild pain.   Alcohol Swabs (B-D SINGLE USE SWABS REGULAR) PADS Use as directed.   aspirin 81 MG tablet Take 81 mg by mouth daily.   Blood Glucose Monitoring Suppl (TRUE METRIX AIR GLUCOSE METER) w/Device KIT 1 kit by Does not apply route 3 (three) times daily.   cetirizine (ZYRTEC) 10 MG tablet TAKE 1 TABLET BY MOUTH EVERY DAY   Cholecalciferol (VITAMIN D) 50 MCG (2000 UT) tablet Take 2,000 Units by mouth daily.   cyanocobalamin 2000 MCG tablet Take 2,000 mcg by mouth daily.   dapagliflozin propanediol (FARXIGA) 10 MG TABS tablet Take 10 mg by mouth daily.   famotidine (PEPCID) 40 MG tablet TAKE 1 TABLET BY MOUTH EVERY DAY   glucose blood (TRUE METRIX BLOOD GLUCOSE TEST) test strip  Use as instructed   hydrALAZINE (APRESOLINE) 50 MG tablet Take 1 tablet (50 mg total) by mouth 3 (three) times daily.   insulin NPH-regular Human (NOVOLIN 70/30) (70-30) 100 UNIT/ML injection Inject 25 Units into the skin 2 (two) times daily with a meal.   levothyroxine (SYNTHROID) 100 MCG tablet Take 1 tablet (100 mcg total) by mouth  daily.   losartan (COZAAR) 25 MG tablet Take 1 tablet (25 mg total) by mouth daily.   magnesium oxide (MAG-OX) 400 (240 Mg) MG tablet Take 1 tablet (400 mg total) by mouth 2 (two) times daily.   metoCLOPramide (REGLAN) 10 MG tablet TAKE 1 TABLET FOUR TIMES DAILY   Omega-3 Fatty Acids (OMEGA 3 PO) Take 2 capsules by mouth daily. Omega XL supplement   pantoprazole (PROTONIX) 40 MG tablet Take 1 tablet (40 mg total) by mouth 2 (two) times daily.   Polyethyl Glycol-Propyl Glycol (SYSTANE ULTRA) 0.4-0.3 % SOLN Apply 1-2 drops to eye daily as needed (dry eyes).   polyethylene glycol (MIRALAX / GLYCOLAX) packet Take 17 g by mouth daily as needed for moderate constipation (MIX AND DRINK).   rosuvastatin (CRESTOR) 20 MG tablet TAKE 1 TABLET AT BEDTIME   SUMAtriptan (IMITREX) 50 MG tablet TAKE 1 TAB EVERY 2 HRS AS NEEDED FOR MIGRAINE. MAY REPEAT IN 2 HOURS IF HEADACHE PERSISTS OR RECURS. (Patient taking differently: Take 50 mg by mouth every 2 (two) hours as needed for migraine (May repeat in 2 hours if headache persists or recurs.).)   torsemide (DEMADEX) 20 MG tablet Take 1 tablet (20 mg total) by mouth 2 (two) times daily. (Patient taking differently: Take 20 mg by mouth 2 (two) times daily as needed (swelling).)   traMADol (ULTRAM) 50 MG tablet Take 1 tablet (50 mg total) by mouth every 6 (six) hours as needed (mild pain).   TRUEplus Lancets 33G MISC Use as directed   Vitamin D, Ergocalciferol, (DRISDOL) 1.25 MG (50000 UNIT) CAPS capsule Take 1 capsule (50,000 Units total) by mouth every 7 (seven) days.   amLODipine (NORVASC) 10 MG tablet Take 1 tablet (10 mg total) by mouth  daily. (Patient not taking: Reported on 01/13/2023)   calcium carbonate (TUMS - DOSED IN MG ELEMENTAL CALCIUM) 500 MG chewable tablet Chew 4 tablets (800 mg of elemental calcium total) by mouth 3 (three) times daily. (Patient not taking: Reported on 01/13/2023)   No facility-administered encounter medications on file as of 02/08/2023.    ALLERGIES: Allergies  Allergen Reactions   Dilaudid [Hydromorphone Hcl] Nausea And Vomiting   Lipitor [Atorvastatin] Other (See Comments)    Body & Muscle Aches   Adhesive [Tape] Rash   Ampicillin Rash and Other (See Comments)    Also developed welts on her hands when she took it in the hospital   Latex Itching, Dermatitis, Rash and Other (See Comments)    Patient had a reaction to tape after surgery and they told her she was allergic to Latex    Penicillins Rash         VACCINATION STATUS: Immunization History  Administered Date(s) Administered   Fluad Quad(high Dose 65+) 12/13/2019, 02/01/2021, 01/06/2022   Influenza Split 01/31/2011   Influenza Whole 02/23/2007   Influenza, High Dose Seasonal PF 01/10/2017   Influenza,inj,Quad PF,6+ Mos 01/07/2014, 02/09/2016, 01/01/2018, 12/03/2018   Influenza-Unspecified 01/10/2012   PFIZER Comirnaty(Gray Top)Covid-19 Tri-Sucrose Vaccine 05/24/2019, 06/18/2019, 01/11/2020, 08/15/2020   PFIZER(Purple Top)SARS-COV-2 Vaccination 05/24/2019, 06/18/2019, 01/11/2020   Pfizer(Comirnaty)Fall Seasonal Vaccine 12 years and older 01/02/2023   Pneumococcal Conjugate-13 05/16/2019   Pneumococcal Polysaccharide-23 08/25/2014   Tdap 04/20/2011, 07/20/2022    Diabetes She presents for her follow-up diabetic visit. She has type 2 diabetes mellitus. Onset time: She was diagnosed at approximate age of 38 years. Her disease course has been fluctuating. There are no hypoglycemic associated symptoms. Pertinent negatives for hypoglycemia include no confusion, headaches, pallor, seizures or  tremors. Associated symptoms include  fatigue, polydipsia and polyuria. Pertinent negatives for diabetes include no blurred vision, no chest pain, no foot ulcerations, no polyphagia and no weight loss. Pertinent negatives for hypoglycemia complications include no nocturnal hypoglycemia. Symptoms are improving. Diabetic complications include heart disease, nephropathy and retinopathy. Risk factors for coronary artery disease include diabetes mellitus, hypertension, obesity, sedentary lifestyle, post-menopausal and dyslipidemia. Current diabetic treatment includes insulin injections and oral agent (monotherapy). She is compliant with treatment most of the time. Her weight is increasing steadily. She is following a generally unhealthy diet. When asked about meal planning, she reported none. She has not had a previous visit with a dietitian. She never participates in exercise. Her home blood glucose trend is decreasing steadily. Her overall blood glucose range is 180-200 mg/dl. (She presents today with her CGM and logs showing slightly above target glycemic profile overall.  Her POCT A1c today is 8.3%, increasing from last visit of 7.2%.  Analysis of her CGM shows TIR 49%, TAR 51%, TBR 0% with a GMI of 7.7%.   She notes she will take some extra insulin when glucose elevates to 300 or so to bring it down but ends up bringing it down too much causing a spike afterwards.) An ACE inhibitor/angiotensin II receptor blocker is being taken. She does not see a podiatrist.Eye exam is current.  Hypertension This is a chronic problem. The current episode started more than 1 year ago. The problem has been gradually improving since onset. The problem is controlled. Pertinent negatives include no blurred vision, chest pain, headaches, palpitations or shortness of breath. Agents associated with hypertension include thyroid hormones. Risk factors for coronary artery disease include diabetes mellitus, obesity, sedentary lifestyle, dyslipidemia, family history and  post-menopausal state. Past treatments include angiotensin blockers, calcium channel blockers, beta blockers, diuretics and direct vasodilators. The current treatment provides mild improvement. There are no compliance problems.  Hypertensive end-organ damage includes kidney disease, CAD/MI and retinopathy. Identifiable causes of hypertension include chronic renal disease, hyperparathyroidism and a thyroid problem. undergoing workup for hyperparathyroidism.  Thyroid Problem Presents for follow-up visit. Onset time: She underwent what appears to be total thyroidectomy in her 30s for reportedly benign goiter.  Has taken thyroid hormone ever since, currently levothyroxine 100 mcg p.o. nightly. Symptoms include depressed mood and fatigue. Patient reports no cold intolerance, diarrhea, heat intolerance, palpitations, tremors, weight gain or weight loss. The symptoms have been stable. Past treatments include levothyroxine. Prior procedures include thyroidectomy.     Review of systems  Constitutional: + steadily increasing body weight,  current Body mass index is 32.48 kg/m. , no fatigue, no subjective hyperthermia, no subjective hypothermia Eyes: no blurry vision, no xerophthalmia ENT: no sore throat, no nodules palpated in throat, no dysphagia/odynophagia, no hoarseness Cardiovascular: no chest pain, no shortness of breath, no palpitations Respiratory: no cough, no shortness of breath Gastrointestinal: no nausea/vomiting/diarrhea Musculoskeletal: c/o chronic back pain- has spinal stimulator placed Skin: no rashes, no hyperemia Neurological: no tremors, no numbness, no tingling, no dizziness Psychiatric: no depression, no anxiety    Objective:    BP (!) 142/78 (BP Location: Left Arm, Patient Position: Sitting, Cuff Size: Large) Comment: Recheck with a manuel cuff. Patient follows her PCP for her BP. Aarionna Germer made aware  Pulse (!) 56   Ht 5\' 4"  (1.626 m)   Wt 189 lb 3.2 oz (85.8 kg)   BMI 32.48  kg/m   Wt Readings from Last 3 Encounters:  02/08/23 189 lb 3.2 oz (85.8 kg)  01/13/23  185 lb (83.9 kg)  11/22/22 177 lb (80.3 kg)    BP Readings from Last 3 Encounters:  02/08/23 (!) 142/78  01/13/23 (!) 170/80  11/22/22 (!) 147/94     Physical Exam- Limited  Constitutional:  Body mass index is 32.48 kg/m. , not in acute distress, normal state of mind Eyes:  EOMI, no exophthalmos Musculoskeletal: no gross deformities, strength intact in all four extremities, no gross restriction of joint movements Skin:  no rashes, no hyperemia Neurological: no tremor with outstretched hands  Diabetic Foot Exam - Simple   No data filed         Latest Ref Rng & Units 02/06/2023    2:45 PM 10/12/2022    2:11 AM 10/04/2022    4:00 PM  CMP  Glucose 70 - 99 mg/dL 540  981  191   BUN 8 - 27 mg/dL 26  27  27    Creatinine 0.57 - 1.00 mg/dL 4.78  2.95  6.21   Sodium 134 - 144 mmol/L 141  135  135   Potassium 3.5 - 5.2 mmol/L 4.8  5.2  4.3   Chloride 96 - 106 mmol/L 105  106  107   CO2 20 - 29 mmol/L 18  20  20    Calcium 8.7 - 10.3 mg/dL 8.8  9.7  30.8   Total Protein 6.0 - 8.5 g/dL 7.2     Total Bilirubin 0.0 - 1.2 mg/dL 0.4     Alkaline Phos 44 - 121 IU/L 56     AST 0 - 40 IU/L 22     ALT 0 - 32 IU/L 7        Diabetic Labs (most recent): Lab Results  Component Value Date   HGBA1C 7.2 (H) 10/04/2022   HGBA1C 8.4 (H) 06/14/2022   HGBA1C 8.3 (H) 03/17/2022   MICROALBUR 570.4 08/05/2019   MICROALBUR 188.7 06/01/2018   MICROALBUR 38.3 06/30/2016     Lipid Panel ( most recent) Lipid Panel     Component Value Date/Time   CHOL 133 06/14/2022 1122   CHOL 162 02/05/2021 0947   TRIG 225.0 (H) 06/14/2022 1122   HDL 41.50 06/14/2022 1122   HDL 51 02/05/2021 0947   CHOLHDL 3 06/14/2022 1122   VLDL 45.0 (H) 06/14/2022 1122   LDLCALC 76 02/05/2021 0947   LDLCALC 60 06/02/2020 0816   LDLDIRECT 58.0 06/14/2022 1122      Assessment & Plan:   1) DM type 2 causing vascular disease  (HCC)  - [name] has currently uncontrolled symptomatic type 2 DM since 72 years of age.  She presents today with her CGM and logs showing slightly above target glycemic profile overall.  Her POCT A1c today is 8.3%, increasing from last visit of 7.2%.  Analysis of her CGM shows TIR 49%, TAR 51%, TBR 0% with a GMI of 7.7%.   She notes she will take some extra insulin when glucose elevates to 300 or so to bring it down but ends up bringing it down too much causing a spike afterwards.  -her diabetes is complicated by retinopathy, obesity/sedentary life, and she remains at a high risk for more acute and chronic complications which include CAD, CVA, CKD, retinopathy, and neuropathy. These are all discussed in detail with her.  The following Lifestyle Medicine recommendations according to American College of Lifestyle Medicine [facility]) were discussed and offered to patient and she agrees to start the journey:  A. Whole Foods, Plant-based plate comprising of fruits and vegetables,  plant-based proteins, whole-grain carbohydrates was discussed in detail with the patient.   A list for source of those nutrients were also provided to the patient.  Patient will use only water or unsweetened tea for hydration. B.  The need to stay away from risky substances including alcohol, smoking; obtaining 7 to 9 hours of restorative sleep, at least 150 minutes of moderate intensity exercise weekly, the importance of healthy social connections,  and stress reduction techniques were discussed. C.  A full color page of  Calorie density of various food groups per pound showing examples of each food groups was provided to the patient.  - Nutritional counseling repeated at each appointment due to patients tendency to fall back in to old habits.  - The patient admits there is a room for improvement in their diet and drink choices. -  Suggestion is made for the patient to avoid simple carbohydrates from their diet including  Cakes, Sweet Desserts / Pastries, Ice Cream, Soda (diet and regular), Sweet Tea, Candies, Chips, Cookies, Sweet Pastries, Store Bought Juices, Alcohol in Excess of 1-2 drinks a day, Artificial Sweeteners, Coffee Creamer, and "Sugar-free" Products. This will help patient to have stable blood glucose profile and potentially avoid unintended weight gain.   - I encouraged the patient to switch to unprocessed or minimally processed complex starch and increased protein intake (animal or plant source), fruits, and vegetables.   - Patient is advised to stick to a routine mealtimes to eat 3 meals a day and avoid unnecessary snacks (to snack only to correct hypoglycemia).  - she is following with Norm Salt, RDN, CDE for individualized diabetes education.  - I have approached her with the following individualized plan to manage diabetes and patient agrees:   -She will continue to benefit from her current simplified insulin treatment regimen.    -She is encouraged to stay engaged for proper monitoring and safe use of insulin (which she has struggled with in the past).  -She is advised to continue Novolin 70/30 to 25 units SQ BID with meals if glucose is above 90 and she is eating.  I warned her against taking more insulin when glucose is high to prevent sudden drops.  She can continue her Farxiga 10 mg po daily as prescribed by nephrology.  -She is encouraged to start monitoring blood glucose consistently 4 times per day (using her CGM), before meals and before bed and to call the clinic if she has readings less than 70 or greater than 200 for 3 tests in a row.  - she is warned not to take insulin without proper monitoring per orders.  -She is not a candidate for Metformin due to CKD.    - Patient specific target  A1c;  LDL, HDL, Triglycerides, were discussed in detail.  2) BP/HTN:  Her blood pressure is controlled to target for her age.  She is advised to continue her medication regimen as  prescribed by PCP and nephrology.    3) Lipids/HPL:    Her most recent lipid panel from 06/14/22 shows controlled LDL of 58 and elevated triglycerides of 225.  She is advised to continue Crestor 20 mg po daily.    4)  Weight/Diet:  Her Body mass index is 32.48 kg/m.- clearly complicating her diabetes care.  She is a candidate for modest weight loss.  I discussed with her the fact that loss of 5 - 10% of her  current body weight will have the most impact on her diabetes management.  CDE Consult will be initiated . Exercise, and detailed carbohydrates information provided  -  detailed on discharge instructions.  5) Hypothyroidism-postsurgical Her previsit TFTs are consistent with appropriate hormone replacement.  She is advised to continue Levothyroxine 100 mcg po daily before breakfast.   - We discussed about the correct intake of her thyroid hormone, on empty stomach at fasting, with water, separated by at least 30 minutes from breakfast and other medications,  and separated by more than 4 hours from calcium, iron, multivitamins, acid reflux medications (PPIs). -Patient is made aware of the fact that thyroid hormone replacement is needed for life, dose to be adjusted by periodic monitoring of thyroid function tests.  6) Multinodular goiter. -Her most recent thyroid/neck ultrasound from 09/12/18 revealed 2.9 cm nodule on the left lobe of her thyroid -She has surgically absent right thyroid lobe.  Biopsy of 2.9 cm nodule on the left lobe is benign.    She will not need any surgical intervention at this time.    7) Vitamin D deficiency -Her most recent vitamin D level on 06/02/20 was 15.  She was taken off her vitamin D supplement by her nephrologist. Will recommend she follow up with her nephrologist regarding low vitamin D levels.  8) Chronic Care/Health Maintenance: -she is on ARB and is encouraged to initiate and continue to follow up with Ophthalmology, Dentist,  Podiatrist at least yearly or  according to recommendations, and advised to  stay away from smoking. I have recommended yearly flu vaccine and pneumonia vaccine at least every 5 years; moderate intensity exercise for up to 150 minutes weekly; and  sleep for at least 7 hours a day.  - I advised patient to maintain close follow up with Sheliah Hatch, MD for primary care needs.     I spent  41  minutes in the care of the patient today including review of labs from CMP, Lipids, Thyroid Function, Hematology (current and previous including abstractions from other facilities); face-to-face time discussing  her blood glucose readings/logs, discussing hypoglycemia and hyperglycemia episodes and symptoms, medications doses, her options of short and long term treatment based on the latest standards of care / guidelines;  discussion about incorporating lifestyle medicine;  and documenting the encounter. Risk reduction counseling performed per USPSTF guidelines to reduce obesity and cardiovascular risk factors.     Please refer to Patient Instructions for Blood Glucose Monitoring and Insulin/Medications Dosing Guide"  in media tab for additional information. Please  also refer to " Patient Self Inventory" in the Media  tab for reviewed elements of pertinent patient history.  Ulyess Mort participated in the discussions, expressed understanding, and voiced agreement with the above plans.  All questions were answered to her satisfaction. she is encouraged to contact clinic should she have any questions or concerns prior to her return visit.   Follow up plan: - No follow-ups on file.  Ronny Bacon, [facility] [address] [place], [place] [address] Phone: [phone] Fax: [phone]  02/08/2023, 11:39 AM

## 2023-03-25 ENCOUNTER — Other Ambulatory Visit: Payer: Self-pay | Admitting: Family Medicine

## 2023-03-25 DIAGNOSIS — E039 Hypothyroidism, unspecified: Secondary | ICD-10-CM

## 2023-04-13 ENCOUNTER — Encounter: Payer: Self-pay | Admitting: Family Medicine

## 2023-04-13 ENCOUNTER — Ambulatory Visit: Payer: Medicare HMO | Admitting: Family Medicine

## 2023-04-13 VITALS — BP 122/60 | HR 60 | Temp 97.8°F | Ht 64.0 in | Wt 187.0 lb

## 2023-04-13 DIAGNOSIS — E1159 Type 2 diabetes mellitus with other circulatory complications: Secondary | ICD-10-CM

## 2023-04-13 DIAGNOSIS — G63 Polyneuropathy in diseases classified elsewhere: Secondary | ICD-10-CM

## 2023-04-13 DIAGNOSIS — R438 Other disturbances of smell and taste: Secondary | ICD-10-CM

## 2023-04-13 MED ORDER — GABAPENTIN 100 MG PO CAPS: 100.0000 mg | ORAL_CAPSULE | Freq: Every day | ORAL | 3 refills | Status: DC

## 2023-04-13 NOTE — Patient Instructions (Signed)
 Schedule your complete physical in 3 months We'll notify you of your lab results and make any changes if needed Continue to drink your lemonade but add water , too Try the Biotene dry mouth products again and see if they help w/ the taste Call with any questions or concerns Stay Safe!  Stay Healthy! Happy New Year!

## 2023-04-13 NOTE — Progress Notes (Signed)
    Subjective:    Patient ID: [name], female    DOB: Oct 07, 1950, 73 y.o.   MRN: [id number]  HPI 'food tastes metallic'- pt reports sxs are ongoing for months.  Saw ENT last summer and work up was unrevealing.  Meat tastes the worst.  Able to eat fish.  No issues w/ fruits and veggies.  Pt is typically eating only 1 meal/day.  Has been to dentist and nothing identified.  Had deep root cleaning.  Pt reports current metallic taste is similar- but not as bad- as when she was on chemo.  + dry mouth and dry lips.  Tingling of hands/feet- pt reports fingers and toes are numb and tingling.  This is bothersome to her.  Has known diabetes.  Not currently on medication for neuropathy.  Review of Systems For ROS see HPI     Objective:   Physical Exam Vitals reviewed.  Constitutional:      General: She is not in acute distress.    Appearance: Normal appearance. She is well-developed. She is not ill-appearing.  HENT:     Head: Normocephalic and atraumatic.  Eyes:     Conjunctiva/sclera: Conjunctivae normal.     Pupils: Pupils are equal, round, and reactive to light.  Neck:     Thyroid : No thyromegaly.  Cardiovascular:     Rate and Rhythm: Normal rate and regular rhythm.     Pulses: Normal pulses.     Heart sounds: Normal heart sounds.  Pulmonary:     Effort: Pulmonary effort is normal. No respiratory distress.     Breath sounds: Normal breath sounds.  Abdominal:     General: There is no distension.     Palpations: Abdomen is soft.     Tenderness: There is no abdominal tenderness.  Musculoskeletal:     Cervical back: Normal range of motion and neck supple.  Lymphadenopathy:     Cervical: No cervical adenopathy.  Skin:    General: Skin is warm and dry.  Neurological:     Mental Status: She is alert and oriented to person, place, and time.     Sensory: Sensory deficit (decreased sensation of feet bilaterally) present.  Psychiatric:        Mood and Affect: Mood normal.         Behavior: Behavior normal.           Assessment & Plan:

## 2023-04-14 ENCOUNTER — Other Ambulatory Visit: Payer: Medicare HMO

## 2023-04-14 LAB — MICROALBUMIN / CREATININE URINE RATIO
Creatinine,U: 59.3 mg/dL
Microalb Creat Ratio: 274.7 mg/g — ABNORMAL HIGH (ref 0.0–30.0)
Microalb, Ur: 163 mg/dL — ABNORMAL HIGH (ref 0.0–1.9)

## 2023-04-14 LAB — B12 AND FOLATE PANEL
Folate: >24.2 ng/mL (ref >5.9)
Vitamin B-12: 510 pg/mL (ref 211–911)

## 2023-04-19 LAB — CERULOPLASMIN: Ceruloplasmin: 28 mg/dL (ref 14–48)

## 2023-04-19 LAB — ZINC: Zinc: 76 ug/dL (ref 60–130)

## 2023-04-20 ENCOUNTER — Telehealth: Payer: Self-pay

## 2023-04-20 NOTE — Telephone Encounter (Signed)
 Since everything looks good metabolically, she can try using OTC Biotene or other dry mouth products to help with the metallic taste.  Or she can keep sugar free hard candy to suck on to help w/ both the taste and the dryness

## 2023-04-20 NOTE — Telephone Encounter (Signed)
-----   Message from Neena Rhymes sent at 04/20/2023  7:28 AM EST ----- Zinc and copper levels are normal- this is great news!

## 2023-04-20 NOTE — Telephone Encounter (Signed)
 Pt has been notified.

## 2023-04-29 DIAGNOSIS — E1165 Type 2 diabetes mellitus with hyperglycemia: Secondary | ICD-10-CM | POA: Diagnosis not present

## 2023-05-07 LAB — HM DIABETES EYE EXAM

## 2023-05-09 DIAGNOSIS — E113293 Type 2 diabetes mellitus with mild nonproliferative diabetic retinopathy without macular edema, bilateral: Secondary | ICD-10-CM | POA: Diagnosis not present

## 2023-05-11 ENCOUNTER — Telehealth: Payer: Self-pay

## 2023-05-11 DIAGNOSIS — R438 Other disturbances of smell and taste: Secondary | ICD-10-CM | POA: Insufficient documentation

## 2023-05-11 NOTE — Assessment & Plan Note (Signed)
Ongoing issue.  Likely diabetes related but possibly multifactorial.  Check labs to r/o underlying metabolic cause.  Start Gabapentin nightly for symptom relief.  Pt expressed understanding and is in agreement w/ plan.

## 2023-05-11 NOTE — Assessment & Plan Note (Signed)
Chronic problem.  Following w/ Endo but due for microalbumin.  Ordered and collected today.

## 2023-05-11 NOTE — Progress Notes (Signed)
Care Guide Pharmacy Note  05/11/2023 Name: JENEE SPAUGH MRN: [id number] DOB: 1950/07/01  Referred By: Sheliah Hatch, MD Reason for referral: Care Coordination ([facility]. )   FARDOWSA AUTHIER is a 73 y.o. year old female who is a primary care patient of Beverely Low, Helane Rima, MD.  DEANNDRA KIRLEY was referred to the pharmacist for assistance related to: DMII  Successful contact was made with the patient to discuss pharmacy services.  Patient declines engagement at this time. Contact information was provided to the patient should they wish to reach out for assistance at a later time.  [facility]  [facility], [facility] Health Care Management Assistant Direct Dial: [phone]  Fax: [phone]

## 2023-05-11 NOTE — Assessment & Plan Note (Signed)
Ongoing issue for pt over recent months.  Saw ENT over the summer and work up was unrevealing.  She can eat fish, fruits, and veggies w/o difficulty.  Meat tastes the worst.  She saw her dentist who did a deep root cleaning but sxs have not improved.  Taste is similar to when she was on chemo.  Likely a component of dry mouth and will check labs to r/o metabolic causes.  Encouraged her to use Biotene, drink sips of water frequently.  Pt expressed understanding and is in agreement w/ plan.

## 2023-05-12 ENCOUNTER — Ambulatory Visit: Payer: Medicare HMO | Admitting: Nurse Practitioner

## 2023-05-12 ENCOUNTER — Encounter: Payer: Self-pay | Admitting: Nurse Practitioner

## 2023-05-12 VITALS — BP 142/75 | HR 53 | Ht 64.0 in | Wt 193.0 lb

## 2023-05-12 DIAGNOSIS — E559 Vitamin D deficiency, unspecified: Secondary | ICD-10-CM

## 2023-05-12 DIAGNOSIS — Z794 Long term (current) use of insulin: Secondary | ICD-10-CM

## 2023-05-12 DIAGNOSIS — E042 Nontoxic multinodular goiter: Secondary | ICD-10-CM | POA: Diagnosis not present

## 2023-05-12 DIAGNOSIS — I10 Essential (primary) hypertension: Secondary | ICD-10-CM

## 2023-05-12 DIAGNOSIS — Z789 Other specified health status: Secondary | ICD-10-CM

## 2023-05-12 DIAGNOSIS — E890 Postprocedural hypothyroidism: Secondary | ICD-10-CM

## 2023-05-12 DIAGNOSIS — E1159 Type 2 diabetes mellitus with other circulatory complications: Secondary | ICD-10-CM

## 2023-05-12 DIAGNOSIS — E039 Hypothyroidism, unspecified: Secondary | ICD-10-CM

## 2023-05-12 DIAGNOSIS — Z7984 Long term (current) use of oral hypoglycemic drugs: Secondary | ICD-10-CM | POA: Diagnosis not present

## 2023-05-12 LAB — POCT GLYCOSYLATED HEMOGLOBIN (HGB A1C): Hemoglobin A1C: 10.1 % — AB (ref 4.0–5.6)

## 2023-05-12 NOTE — Progress Notes (Signed)
05/12/2023, 11:56 AM   Endocrinology follow-up note   Subjective:    Patient ID: [name], female    DOB: 11/24/1950.  [name] is being seen in follow-up for management of currently uncontrolled,complicated symptomatic type 2 diabetes and associated hypertension, hypothyroidism. PMD :  Sheliah Hatch, MD.   Past Medical History:  Diagnosis Date   Anginal pain (HCC) 03/19/2012   saw Dr. Melburn Popper .. she thinks its more related to stomach issues   Arthritis    "all over; mostly in my back" (03/20/2012)   Breast cancer (HCC)    "right" (03/20/2012)   Cardiomyopathy    Chest pain 06/2005   Hospitalized, dystolic dysfunction   Chronic lower back pain    "have 2 herniated discs; going to have to have a fusion" (03/20/2012)   CKD (chronic kidney disease), stage III (HCC)    Family history of anesthesia complication    "father has nausea and vomiting"   Gallstones    GERD (gastroesophageal reflux disease)    H/O hiatal hernia    History of kidney stones    History of right mastectomy    Hypertension    "patient states never had HTN, takes Hyzaar for heart   Hypothyroidism    Irritable bowel syndrome    Migraines    "it's been a long time since I've had one" (03/20/2012)   Osteoarthritis    Peripheral neuropathy    PONV (postoperative nausea and vomiting)    Sciatica    Sinus bradycardia    Sleep apnea    mild, no CPAP   Type II diabetes mellitus (HCC)    Past Surgical History:  Procedure Laterality Date   ABDOMINAL HYSTERECTOMY  1993   adenosin cardiolite  07/2005   (+) wall motion abnormality   AXILLARY LYMPH NODE DISSECTION  2003   squamous cell cancer   BACK SURGERY     CARDIAC CATHETERIZATION  ? 2005 / 2007   CHOLECYSTECTOMY N/A 01/24/2018   Procedure: LAPAROSCOPIC CHOLECYSTECTOMY;  Surgeon: Harriette Bouillon, MD;  Location: [facility] OR;  Service: General;  Laterality:  N/A;   CT abdomen and pelvis  02/2010   Same   ESOPHAGOGASTRODUODENOSCOPY  2005   GERD   KNEE ARTHROSCOPY  1990's   "right" (03/20/2012)   LUMBAR FUSION N/A 08/22/2014   Procedure: Right L2-3 and right L1-2 transforaminal lumbar interbody fusion with pedicle screws, rods, sleeves, cages, local bone graft, Vivigen, cancellous chips;  Surgeon: Kerrin Champagne, MD;  Location: [facility] OR;  Service: Orthopedics;  Laterality: N/A;   LUMBAR LAMINECTOMY/DECOMPRESSION MICRODISCECTOMY N/A 02/04/2014   Procedure: RIGHT L2-3 MICRODISCECTOMY ;  Surgeon: Kerrin Champagne, MD;  Location: [facility] OR;  Service: Orthopedics;  Laterality: N/A;   MASTECTOMY WITH AXILLARY LYMPH NODE DISSECTION  12/2003   "right" (03/20/2012)   PARATHYROIDECTOMY Left 10/11/2022   Procedure: MINIMALLY INVASIVE LEFT INFERIOR PARATHYROIDECTOMY;  Surgeon: Axel Filler, MD;  Location: [facility] OR;  Service: General;  Laterality: Left;   POSTERIOR CERVICAL FUSION/FORAMINOTOMY  2001   SHOULDER ARTHROSCOPY W/ ROTATOR CUFF REPAIR  2003   Left   THYROIDECTOMY  1977   Right   TOTAL KNEE ARTHROPLASTY  2000   Right   [place] of abdomen  07/2005   Fatty liver / kidney stones   Social History   Socioeconomic History   Marital status: Married    Spouse name: Not on file   Number of children: 1   Years of education: Not on file   Highest education level: Not on file  Occupational History   Occupation: Retired since 2003  Tobacco Use   Smoking status: Never   Smokeless tobacco: Never  Vaping Use   Vaping status: Never Used  Substance and Sexual Activity   Alcohol use: No   Drug use: No   Sexual activity: Not Currently  Other Topics Concern   Not on file  Social History Narrative   Not on file   Social Drivers of Health   Financial Resource Strain: Medium Risk (01/05/2023)   Overall Financial Resource Strain (CARDIA)    Difficulty of Paying Living Expenses: Somewhat hard  Food Insecurity: No Food Insecurity (01/05/2023)   Hunger Vital Sign     Worried About Running Out of Food in the Last Year: Never true    Ran Out of Food in the Last Year: Never true  Transportation Needs: No Transportation Needs (01/05/2023)   PRAPARE - Administrator, Civil Service (Medical): No    Lack of Transportation (Non-Medical): No  Physical Activity: Inactive (01/05/2023)   Exercise Vital Sign    Days of Exercise per Week: 0 days    Minutes of Exercise per Session: 0 min  Stress: No Stress Concern Present (01/05/2023)   [employer] of Occupational Health - Occupational Stress Questionnaire    Feeling of Stress : Not at all  Social Connections: Moderately Integrated (01/05/2023)   Social Connection and Isolation Panel [NHANES]    Frequency of Communication with Friends and Family: More than three times a week    Frequency of Social Gatherings with Friends and Family: Three times a week    Attends Religious Services: More than 4 times per year    Active Member of Clubs or Organizations: No    Attends Banker Meetings: Never    Marital Status: Married   Outpatient Encounter Medications as of 05/12/2023  Medication Sig   acetaminophen (TYLENOL) 500 MG tablet Take 1,000 mg by mouth every 6 (six) hours as needed for moderate pain, headache or mild pain.   Alcohol Swabs (B-D SINGLE USE SWABS REGULAR) PADS Use as directed.   aspirin 81 MG tablet Take 81 mg by mouth daily.   Blood Glucose Monitoring Suppl (TRUE METRIX AIR GLUCOSE METER) w/Device KIT 1 kit by Does not apply route 3 (three) times daily.   cetirizine (ZYRTEC) 10 MG tablet TAKE 1 TABLET BY MOUTH EVERY DAY   Cholecalciferol (VITAMIN D) 50 MCG (2000 UT) tablet Take 2,000 Units by mouth daily.   COMIRNATY syringe    cyanocobalamin 2000 MCG tablet Take 2,000 mcg by mouth daily.   dapagliflozin propanediol (FARXIGA) 10 MG TABS tablet Take 10 mg by mouth daily. (Patient not taking: Reported on 05/12/2023)   diazepam (VALIUM) 5 MG tablet Take 5 mg by mouth 2 (two) times  daily.   famotidine (PEPCID) 40 MG tablet TAKE 1 TABLET BY MOUTH EVERY DAY   gabapentin (NEURONTIN) 100 MG capsule Take 1 capsule (100 mg total) by mouth at bedtime.   glucose blood (TRUE METRIX BLOOD GLUCOSE TEST) test strip Use as instructed   hydrALAZINE (APRESOLINE) 50 MG tablet Take 1 tablet (50 mg total) by  mouth 3 (three) times daily.   insulin NPH-regular Human (NOVOLIN 70/30) (70-30) 100 UNIT/ML injection Inject 25 Units into the skin 2 (two) times daily with a meal.   levothyroxine (SYNTHROID) 100 MCG tablet Take 1 tablet (100 mcg total) by mouth daily.   losartan (COZAAR) 25 MG tablet Take 1 tablet (25 mg total) by mouth daily.   magnesium oxide (MAG-OX) 400 (240 Mg) MG tablet Take 1 tablet (400 mg total) by mouth 2 (two) times daily.   metoCLOPramide (REGLAN) 10 MG tablet TAKE 1 TABLET FOUR TIMES DAILY   Omega-3 Fatty Acids (OMEGA 3 PO) Take 2 capsules by mouth daily. Omega XL supplement   pantoprazole (PROTONIX) 40 MG tablet Take 1 tablet (40 mg total) by mouth 2 (two) times daily.   Polyethyl Glycol-Propyl Glycol (SYSTANE ULTRA) 0.4-0.3 % SOLN Apply 1-2 drops to eye daily as needed (dry eyes).   polyethylene glycol (MIRALAX / GLYCOLAX) packet Take 17 g by mouth daily as needed for moderate constipation (MIX AND DRINK).   rosuvastatin (CRESTOR) 20 MG tablet TAKE 1 TABLET AT BEDTIME   SUMAtriptan (IMITREX) 50 MG tablet TAKE 1 TAB EVERY 2 HRS AS NEEDED FOR MIGRAINE. MAY REPEAT IN 2 HOURS IF HEADACHE PERSISTS OR RECURS. (Patient taking differently: Take 50 mg by mouth every 2 (two) hours as needed for migraine (May repeat in 2 hours if headache persists or recurs.).)   torsemide (DEMADEX) 20 MG tablet Take 1 tablet (20 mg total) by mouth 2 (two) times daily. (Patient taking differently: Take 20 mg by mouth 2 (two) times daily as needed (swelling).)   traMADol (ULTRAM) 50 MG tablet Take 1 tablet (50 mg total) by mouth every 6 (six) hours as needed (mild pain).   TRUEplus Lancets 33G MISC  Use as directed   No facility-administered encounter medications on file as of 05/12/2023.    ALLERGIES: Allergies  Allergen Reactions   Dilaudid [Hydromorphone Hcl] Nausea And Vomiting   Lipitor [Atorvastatin] Other (See Comments)    Body & Muscle Aches   Adhesive [Tape] Rash   Ampicillin Rash and Other (See Comments)    Also developed welts on her hands when she took it in the hospital   Latex Itching, Dermatitis, Rash and Other (See Comments)    Patient had a reaction to tape after surgery and they told her she was allergic to Latex    Penicillins Rash         VACCINATION STATUS: Immunization History  Administered Date(s) Administered   Fluad Quad(high Dose 65+) 12/13/2019, 02/01/2021, 01/06/2022   Influenza Split 01/31/2011   Influenza Whole 02/23/2007   Influenza, High Dose Seasonal PF 01/10/2017, 01/02/2023   Influenza,inj,Quad PF,6+ Mos 01/07/2014, 02/09/2016, 01/01/2018, 12/03/2018   Influenza-Unspecified 01/10/2012   PFIZER Comirnaty(Gray Top)Covid-19 Tri-Sucrose Vaccine 05/24/2019, 06/18/2019, 01/11/2020, 08/15/2020   PFIZER(Purple Top)SARS-COV-2 Vaccination 05/24/2019, 06/18/2019, 01/11/2020   Pfizer(Comirnaty)Fall Seasonal Vaccine 12 years and older 01/02/2023   Pneumococcal Conjugate-13 05/16/2019   Pneumococcal Polysaccharide-23 08/25/2014   Tdap 04/20/2011, 07/20/2022    Diabetes She presents for her follow-up diabetic visit. She has type 2 diabetes mellitus. Onset time: She was diagnosed at approximate age of 38 years. Her disease course has been worsening. There are no hypoglycemic associated symptoms. Pertinent negatives for hypoglycemia include no confusion, headaches, pallor, seizures or tremors. Associated symptoms include fatigue, polydipsia and polyuria. Pertinent negatives for diabetes include no blurred vision, no chest pain, no foot ulcerations, no polyphagia and no weight loss. Pertinent negatives for hypoglycemia complications include no nocturnal  hypoglycemia. Symptoms are improving. Diabetic complications include heart disease, nephropathy and retinopathy. Risk factors for coronary artery disease include diabetes mellitus, hypertension, obesity, sedentary lifestyle, post-menopausal and dyslipidemia. Current diabetic treatment includes insulin injections and oral agent (monotherapy). She is compliant with treatment most of the time. Her weight is increasing steadily. She is following a generally unhealthy diet. When asked about meal planning, she reported none. She has not had a previous visit with a dietitian. She never participates in exercise. Her home blood glucose trend is increasing steadily. Her breakfast blood glucose range is generally >200 mg/dl. Her lunch blood glucose range is generally >200 mg/dl. Her dinner blood glucose range is generally >200 mg/dl. Her bedtime blood glucose range is generally >200 mg/dl. Her overall blood glucose range is >200 mg/dl. (She presents today with her CGM showing gross hyperglycemia overall.  Her POCT A1c today is 10.1%, increasing from last visit of 8.3%.  Analysis of her CGM shows TIR 4%, TAR 96%, TBR 0% with a GMI of 9.5%.  She has not been on Farxiga, is waiting on PAP with nephrology due to cost.  She admits she has not been taking her insulin regularly either.  She says she often forgets.  She last took it on Monday.) An ACE inhibitor/angiotensin II receptor blocker is being taken. She does not see a podiatrist.Eye exam is current.  Hypertension This is a chronic problem. The current episode started more than 1 year ago. The problem has been gradually improving since onset. The problem is controlled. Pertinent negatives include no blurred vision, chest pain, headaches, palpitations or shortness of breath. Agents associated with hypertension include thyroid hormones. Risk factors for coronary artery disease include diabetes mellitus, obesity, sedentary lifestyle, dyslipidemia, family history and  post-menopausal state. Past treatments include angiotensin blockers, calcium channel blockers, beta blockers, diuretics and direct vasodilators. The current treatment provides mild improvement. There are no compliance problems.  Hypertensive end-organ damage includes kidney disease, CAD/MI and retinopathy. Identifiable causes of hypertension include chronic renal disease, hyperparathyroidism and a thyroid problem. undergoing workup for hyperparathyroidism.  Thyroid Problem Presents for follow-up visit. Symptoms include depressed mood and fatigue. Patient reports no cold intolerance, diarrhea, heat intolerance, palpitations, tremors, weight gain or weight loss. The symptoms have been stable.     Review of systems  Constitutional: + steadily increasing body weight,  current Body mass index is 33.13 kg/m. , no fatigue, no subjective hyperthermia, no subjective hypothermia Eyes: no blurry vision, no xerophthalmia ENT: no sore throat, no nodules palpated in throat, no dysphagia/odynophagia, no hoarseness Cardiovascular: no chest pain, no shortness of breath, no palpitations Respiratory: no cough, no shortness of breath Gastrointestinal: no nausea/vomiting/diarrhea Musculoskeletal: c/o chronic back pain- has spinal stimulator placed Skin: + rash under breasts, no hyperemia Neurological: no tremors, no numbness, no tingling, no dizziness Psychiatric: no depression, no anxiety    Objective:    BP (!) 142/75   Pulse (!) 53   Ht 5\' 4"  (1.626 m)   Wt 193 lb (87.5 kg)   BMI 33.13 kg/m   Wt Readings from Last 3 Encounters:  05/12/23 193 lb (87.5 kg)  04/13/23 187 lb (84.8 kg)  02/08/23 189 lb 3.2 oz (85.8 kg)    BP Readings from Last 3 Encounters:  05/12/23 (!) 142/75  04/13/23 122/60  02/08/23 (!) 142/78      Physical Exam- Limited  Constitutional:  Body mass index is 33.13 kg/m. , not in acute distress, normal state of mind Eyes:  EOMI, no exophthalmos Musculoskeletal:  no gross  deformities, strength intact in all four extremities, no gross restriction of joint movements Skin:  no rashes, no hyperemia Neurological: no tremor with outstretched hands  Diabetic Foot Exam - Simple   No data filed         Latest Ref Rng & Units 02/06/2023    2:45 PM 10/12/2022    2:11 AM 10/04/2022    4:00 PM  CMP  Glucose 70 - 99 mg/dL 960  454  098   BUN 8 - 27 mg/dL 26  27  27    Creatinine 0.57 - 1.00 mg/dL 1.19  1.47  8.29   Sodium 134 - 144 mmol/L 141  135  135   Potassium 3.5 - 5.2 mmol/L 4.8  5.2  4.3   Chloride 96 - 106 mmol/L 105  106  107   CO2 20 - 29 mmol/L 18  20  20    Calcium 8.7 - 10.3 mg/dL 8.8  9.7  56.2   Total Protein 6.0 - 8.5 g/dL 7.2     Total Bilirubin 0.0 - 1.2 mg/dL 0.4     Alkaline Phos 44 - 121 IU/L 56     AST 0 - 40 IU/L 22     ALT 0 - 32 IU/L 7        Diabetic Labs (most recent): Lab Results  Component Value Date   HGBA1C 10.1 (A) 05/12/2023   HGBA1C 8.3 (A) 02/08/2023   HGBA1C 7.2 (H) 10/04/2022   MICROALBUR 163.0 (H) 04/13/2023   MICROALBUR 570.4 08/05/2019   MICROALBUR 188.7 06/01/2018     Lipid Panel ( most recent) Lipid Panel     Component Value Date/Time   CHOL 133 06/14/2022 1122   CHOL 162 02/05/2021 0947   TRIG 225.0 (H) 06/14/2022 1122   HDL 41.50 06/14/2022 1122   HDL 51 02/05/2021 0947   CHOLHDL 3 06/14/2022 1122   VLDL 45.0 (H) 06/14/2022 1122   LDLCALC 76 02/05/2021 0947   LDLCALC 60 06/02/2020 0816   LDLDIRECT 58.0 06/14/2022 1122      Assessment & Plan:   1) DM type 2 causing vascular disease (HCC)  - [name] has currently uncontrolled symptomatic type 2 DM since 73 years of age.  She presents today with her CGM showing gross hyperglycemia overall.  Her POCT A1c today is 10.1%, increasing from last visit of 8.3%.  Analysis of her CGM shows TIR 4%, TAR 96%, TBR 0% with a GMI of 9.5%.  She has not been on Farxiga, is waiting on PAP with nephrology due to cost.  She admits she has not been taking  her insulin regularly either.  She says she often forgets.  She last took it on Monday.  -her diabetes is complicated by retinopathy, obesity/sedentary life, and she remains at a high risk for more acute and chronic complications which include CAD, CVA, CKD, retinopathy, and neuropathy. These are all discussed in detail with her.  The following Lifestyle Medicine recommendations according to American College of Lifestyle Medicine [facility]) were discussed and offered to patient and she agrees to start the journey:  A. Whole Foods, Plant-based plate comprising of fruits and vegetables, plant-based proteins, whole-grain carbohydrates was discussed in detail with the patient.   A list for source of those nutrients were also provided to the patient.  Patient will use only water or unsweetened tea for hydration. B.  The need to stay away from risky substances including alcohol, smoking; obtaining 7 to 9 hours of restorative  sleep, at least 150 minutes of moderate intensity exercise weekly, the importance of healthy social connections,  and stress reduction techniques were discussed. C.  A full color page of  Calorie density of various food groups per pound showing examples of each food groups was provided to the patient.  - Nutritional counseling repeated at each appointment due to patients tendency to fall back in to old habits.  - The patient admits there is a room for improvement in their diet and drink choices. -  Suggestion is made for the patient to avoid simple carbohydrates from their diet including Cakes, Sweet Desserts / Pastries, Ice Cream, Soda (diet and regular), Sweet Tea, Candies, Chips, Cookies, Sweet Pastries, Store Bought Juices, Alcohol in Excess of 1-2 drinks a day, Artificial Sweeteners, Coffee Creamer, and "Sugar-free" Products. This will help patient to have stable blood glucose profile and potentially avoid unintended weight gain.   - I encouraged the patient to switch to unprocessed or  minimally processed complex starch and increased protein intake (animal or plant source), fruits, and vegetables.   - Patient is advised to stick to a routine mealtimes to eat 3 meals a day and avoid unnecessary snacks (to snack only to correct hypoglycemia).  - she is following with Norm Salt, RDN, CDE for individualized diabetes education.  - I have approached her with the following individualized plan to manage diabetes and patient agrees:   -She will continue to benefit from her current simplified insulin treatment regimen.    -She is encouraged to stay engaged for proper monitoring and safe use of insulin (which she has struggled with in the past).  -She is advised to RESTART Novolin 70/30 to 25 units SQ BID with meals if glucose is above 90 and she is eating.  I warned her against taking more insulin when glucose is high to prevent sudden drops.  She can continue her Farxiga 10 mg po daily as prescribed by nephrology- once approved by PAP.  -She is encouraged to start monitoring blood glucose consistently 4 times per day (using her CGM), before meals and before bed and to call the clinic if she has readings less than 70 or greater than 200 for 3 tests in a row.  - she is warned not to take insulin without proper monitoring per orders.  -She is not a candidate for Metformin due to CKD.    - Patient specific target  A1c;  LDL, HDL, Triglycerides, were discussed in detail.  2) BP/HTN:  Her blood pressure is controlled to target for her age.  She is advised to continue her medication regimen as prescribed by PCP and nephrology.    3) Lipids/HPL:    Her most recent lipid panel from 06/14/22 shows controlled LDL of 58 and elevated triglycerides of 225.  She is advised to continue Crestor 20 mg po daily.    4)  Weight/Diet:  Her Body mass index is 33.13 kg/m.- clearly complicating her diabetes care.  She is a candidate for modest weight loss.  I discussed with her the fact that loss  of 5 - 10% of her  current body weight will have the most impact on her diabetes management.  CDE Consult will be initiated . Exercise, and detailed carbohydrates information provided  -  detailed on discharge instructions.  5) Hypothyroidism-postsurgical There are no recent TFTs to review.  She is advised to continue Levothyroxine 100 mcg po daily before breakfast.  Will recheck TFTs prior to next visit and adjust dose  accordingly.   - We discussed about the correct intake of her thyroid hormone, on empty stomach at fasting, with water, separated by at least 30 minutes from breakfast and other medications,  and separated by more than 4 hours from calcium, iron, multivitamins, acid reflux medications (PPIs). -Patient is made aware of the fact that thyroid hormone replacement is needed for life, dose to be adjusted by periodic monitoring of thyroid function tests.  6) Multinodular goiter. -Her most recent thyroid/neck ultrasound from 09/12/18 revealed 2.9 cm nodule on the left lobe of her thyroid -She has surgically absent right thyroid lobe.  Biopsy of 2.9 cm nodule on the left lobe is benign.    She will not need any surgical intervention at this time.    7) Vitamin D deficiency -Her most recent vitamin D level on 06/02/20 was 15.  She was taken off her vitamin D supplement by her nephrologist. Will recommend she follow up with her nephrologist regarding low vitamin D levels.  8) Chronic Care/Health Maintenance: -she is on ARB and is encouraged to initiate and continue to follow up with Ophthalmology, Dentist,  Podiatrist at least yearly or according to recommendations, and advised to  stay away from smoking. I have recommended yearly flu vaccine and pneumonia vaccine at least every 5 years; moderate intensity exercise for up to 150 minutes weekly; and  sleep for at least 7 hours a day.  - I advised patient to maintain close follow up with Sheliah Hatch, MD for primary care needs.     I  spent  25  minutes in the care of the patient today including review of labs from CMP, Lipids, Thyroid Function, Hematology (current and previous including abstractions from other facilities); face-to-face time discussing  her blood glucose readings/logs, discussing hypoglycemia and hyperglycemia episodes and symptoms, medications doses, her options of short and long term treatment based on the latest standards of care / guidelines;  discussion about incorporating lifestyle medicine;  and documenting the encounter. Risk reduction counseling performed per USPSTF guidelines to reduce obesity and cardiovascular risk factors.     Please refer to Patient Instructions for Blood Glucose Monitoring and Insulin/Medications Dosing Guide"  in media tab for additional information. Please  also refer to " Patient Self Inventory" in the Media  tab for reviewed elements of pertinent patient history.  Ulyess Mort participated in the discussions, expressed understanding, and voiced agreement with the above plans.  All questions were answered to her satisfaction. she is encouraged to contact clinic should she have any questions or concerns prior to her return visit.   Follow up plan: - Return in about 3 months (around 08/09/2023) for Diabetes F/U with A1c in office, Thyroid follow up, Previsit labs, Bring meter and logs.  Ronny Bacon, [facility] [address] [place], [place] [address] Phone: [phone] Fax: [phone]  05/12/2023, 11:56 AM

## 2023-06-01 ENCOUNTER — Ambulatory Visit: Payer: Medicare HMO | Admitting: Orthopedic Surgery

## 2023-06-08 ENCOUNTER — Other Ambulatory Visit: Payer: Self-pay | Admitting: Family Medicine

## 2023-06-13 DIAGNOSIS — N184 Chronic kidney disease, stage 4 (severe): Secondary | ICD-10-CM | POA: Diagnosis not present

## 2023-06-15 DIAGNOSIS — N184 Chronic kidney disease, stage 4 (severe): Secondary | ICD-10-CM | POA: Diagnosis not present

## 2023-06-15 DIAGNOSIS — E1122 Type 2 diabetes mellitus with diabetic chronic kidney disease: Secondary | ICD-10-CM | POA: Diagnosis not present

## 2023-06-15 DIAGNOSIS — I272 Pulmonary hypertension, unspecified: Secondary | ICD-10-CM | POA: Diagnosis not present

## 2023-06-15 DIAGNOSIS — I130 Hypertensive heart and chronic kidney disease with heart failure and stage 1 through stage 4 chronic kidney disease, or unspecified chronic kidney disease: Secondary | ICD-10-CM | POA: Diagnosis not present

## 2023-06-15 DIAGNOSIS — R803 Bence Jones proteinuria: Secondary | ICD-10-CM | POA: Diagnosis not present

## 2023-06-15 DIAGNOSIS — I5032 Chronic diastolic (congestive) heart failure: Secondary | ICD-10-CM | POA: Diagnosis not present

## 2023-06-19 ENCOUNTER — Other Ambulatory Visit (INDEPENDENT_AMBULATORY_CARE_PROVIDER_SITE_OTHER): Payer: Self-pay

## 2023-06-19 ENCOUNTER — Ambulatory Visit (INDEPENDENT_AMBULATORY_CARE_PROVIDER_SITE_OTHER): Payer: Medicare HMO | Admitting: Orthopedic Surgery

## 2023-06-19 VITALS — BP 156/82 | HR 70 | Ht 64.0 in | Wt 193.0 lb

## 2023-06-19 DIAGNOSIS — M545 Low back pain, unspecified: Secondary | ICD-10-CM

## 2023-06-19 MED ORDER — GABAPENTIN 100 MG PO CAPS: 100.0000 mg | ORAL_CAPSULE | Freq: Three times a day (TID) | ORAL | 1 refills | Status: DC

## 2023-06-19 NOTE — Progress Notes (Signed)
 Orthopedic Spine Surgery Office Note  Assessment: Patient is a 73 y.o. female with chronic low back pain which has worsened and bilateral posterior thigh pain that are both worse with standing and walking, possible neurogenic claudication   Plan: -Explained that initially conservative treatment is tried as a significant number of patients may experience relief with these treatment modalities. Discussed that the conservative treatments include:  -activity modification  -physical therapy  -over the counter pain medications  -medrol dosepak  -lumbar steroid injections -Patient has tried Tylenol -Recommended PT.  Referral provided to her today.  Prescribed gabapentin for additional pain relief.  Told her she can continue to take Tylenol -If she is not doing any better at her next visit, we will order an MRI to evaluate for lumbar stenosis -Recommended that she call the [facility] [employer] reps to see if they can re-program her spinal cord stimulator  -Patient would need an A1c of 7.5 or less prior to any elective spine surgery -Patient should return to office in 6 weeks, x-rays at next visit: None   Patient expressed understanding of the plan and all questions were answered to the patient's satisfaction.   ___________________________________________________________________________   History:  Patient is a 73 y.o. female who presents today for lumbar spine.  Patient has had multiple lumbar spine surgeries including placement of a spinal cord stimulator.  She comes in today with chronic low back pain that has gotten progressively worse over the last 2 years.  She has noted over that time that pain has started to radiate into her bilateral buttock and posterior thighs.  It does not radiate past the thighs.  She notices the pain when she is standing or walking.  She says it gets significantly better if she sits down but does not completely go away. She finds that she does not have any pain if she  lays down. Has not been able to do household chores as a result of the pain. Has only tried tylenol for treatment.    Weakness: denies Symptoms of imbalance: denies Paresthesias and numbness: Yes, has numbness and paresthesias in her feet bilaterally Bowel or bladder incontinence: denies Saddle anesthesia: denies  Treatments tried: tylenol  Review of systems: Denies fevers and chills, night sweats, unexplained weight loss, pain that wakes her at night. Has a history of breast cancer  Past medical history: Angina Breast cancer Cardiomyopathy Diabetes (last A1c was 10.1 on 05/12/23) Chronic low back pain GERD CKD IBS OSA Neuropathy  Allergies: dilaudid, lipitor, penicillin, latex, adhesive tape, ampicillin  Past surgical history:  Lumbar spine fusion x2 Hysterectomy Right breast mastectomy Right knee arthroscopy Cholecystectomy Left inferior parathyroidectomy Right thyroidectomy Right TKA  Social history: Denies use of nicotine product (smoking, vaping, patches, smokeless) Alcohol use: denies Denies recreational drug use   Physical Exam:  BMI of 33.1  General: no acute distress, appears stated age Neurologic: alert, answering questions appropriately, following commands Respiratory: unlabored breathing on room air, symmetric chest rise Psychiatric: appropriate affect, normal cadence to speech   MSK (spine):  -Strength exam      Left  Right EHL    5/5  5/5 TA    5/5  5/5 GSC    5/5  5/5 Knee extension  5/5  5/5 Hip flexion   5/5  5/5  -Sensory exam    Sensation intact to light touch in L3-S1 nerve distributions of bilateral lower extremities  -Achilles DTR: 0/4 on the left, 0/4 on the right -Patellar tendon DTR: 0/4 on the  left, 0/4 on the right  -Straight leg raise: negative bilaterally -Clonus: no beats bilaterally  -Left hip exam: No pain through range of motion, negative Stinchfield, negative FABER -Right hip exam: No pain to range of motion,  negative Stinchfield, negative FABER  Imaging: XRs of the lumbar spine from 06/19/2023 were independently reviewed and interpreted, showing spondylolisthesis at L4/5. Interbody devices at L2/3 through L5/S1. Posterior instrumentation from L2-S1. Lucency seen around the L2 screws. No other lucency seen. Disc height loss at L1/2. No evidence of instability on flexion/extension views. No fracture or dislocation seen. Both the L2 and L3 screws appear violate the superior endplates of their respective vertebra.    Patient name: [name] Patient MRN: [id number] Date of visit: 06/19/23

## 2023-07-12 ENCOUNTER — Ambulatory Visit (INDEPENDENT_AMBULATORY_CARE_PROVIDER_SITE_OTHER): Payer: Medicare HMO | Admitting: Family Medicine

## 2023-07-12 VITALS — BP 120/70 | HR 78 | Temp 97.9°F | Ht 64.0 in | Wt 194.2 lb

## 2023-07-12 DIAGNOSIS — E559 Vitamin D deficiency, unspecified: Secondary | ICD-10-CM

## 2023-07-12 DIAGNOSIS — Z794 Long term (current) use of insulin: Secondary | ICD-10-CM

## 2023-07-12 DIAGNOSIS — Z0000 Encounter for general adult medical examination without abnormal findings: Secondary | ICD-10-CM

## 2023-07-12 DIAGNOSIS — E1122 Type 2 diabetes mellitus with diabetic chronic kidney disease: Secondary | ICD-10-CM

## 2023-07-12 DIAGNOSIS — M21612 Bunion of left foot: Secondary | ICD-10-CM

## 2023-07-12 DIAGNOSIS — R32 Unspecified urinary incontinence: Secondary | ICD-10-CM

## 2023-07-12 DIAGNOSIS — N184 Chronic kidney disease, stage 4 (severe): Secondary | ICD-10-CM

## 2023-07-12 MED ORDER — MIRABEGRON ER 25 MG PO TB24: 25.0000 mg | ORAL_TABLET | Freq: Every day | ORAL | 3 refills | Status: DC

## 2023-07-12 MED ORDER — AZELASTINE HCL 0.1 % NA SOLN: 1.0000 | Freq: Two times a day (BID) | NASAL | 5 refills | Status: AC

## 2023-07-12 NOTE — Patient Instructions (Signed)
 Follow up in 6 months to recheck blood pressure and cholesterol We'll notify you of your lab results and make any changes if needed ADD the Azelastine nasal spray twice daily RESTART the Flonase once daily CONTINUE the Zyrtec START the Myrbetriq for the bladder but ONLY if reasonably priced Call with any questions or concerns Stay Safe!  Stay Healthy! Happy Spring!!!

## 2023-07-12 NOTE — Progress Notes (Signed)
   Subjective:    Patient ID: [name], female    DOB: 15-Nov-1950, 73 y.o.   MRN: [id number]  HPI CPE- UTD on colonoscopy, mammo, immunizations  Patient Care Team    Relationship Specialty Notifications Start End  Jess Morita, MD PCP - General Family Medicine  12/03/18   Lenise Quince, MD PCP - Cardiology Cardiology  07/09/20   Baby Bolt, MD Consulting Physician Endocrinology  12/03/18   Charity Conch, DPM Consulting Physician Podiatry  12/03/18   Faustina Hood, MD Consulting Physician Pulmonary Disease  06/26/19   Aminta Kales, MD Consulting Physician Ophthalmology  06/26/19   Nan Aver, MD Consulting Physician Nephrology  06/25/20   Myrle Aspen, [facility] (Inactive)  Pharmacist  06/10/21    Comment: [phone]    Health Maintenance  Topic Date Due   Hepatitis C Screening  Never done   OPHTHALMOLOGY EXAM  05/05/2023   HEMOGLOBIN A1C  11/09/2023   INFLUENZA VACCINE  11/10/2023   Colonoscopy  11/22/2023   Medicare Annual Wellness (AWV)  01/05/2024   MAMMOGRAM  01/27/2024   Diabetic kidney evaluation - eGFR measurement  02/06/2024   Diabetic kidney evaluation - Urine ACR  04/12/2024   FOOT EXAM  07/11/2024   Pneumonia Vaccine 51+ Years old  Completed   DEXA SCAN  Completed   HPV VACCINES  Aged Out   DTaP/Tdap/Td  Discontinued   COVID-19 Vaccine  Discontinued   Zoster Vaccines- Shingrix  Discontinued     Review of Systems Patient reports no vision/ hearing changes, adenopathy,fever, weight change,  persistant/recurrent hoarseness , swallowing issues, chest pain, palpitations, edema, persistant/recurrent cough, hemoptysis, dyspnea (rest/exertional/paroxysmal nocturnal), gastrointestinal bleeding (melena, rectal bleeding), abdominal pain, significant heartburn, bowel changes, Gyn symptoms (abnormal  bleeding, pain),  syncope, focal weakness, memory loss, skin/hair/nail changes, abnormal bruising or bleeding, anxiety, or depression.   +  neuropathy + bladder incontinence    Objective:   Physical Exam General Appearance:    Alert, cooperative, no distress, appears stated age  Head:    Normocephalic, without obvious abnormality, atraumatic  Eyes:    PERRL, conjunctiva/corneas clear, EOM's intact both eyes  Ears:    Normal TM's and external ear canals, both ears  Nose:   Nares normal, septum midline, mucosa normal, no drainage    or sinus tenderness  Throat:   Lips, mucosa, and tongue normal; teeth and gums normal  Neck:   Supple, symmetrical, trachea midline, no adenopathy;    Thyroid : no enlargement/tenderness/nodules  Back:     Symmetric, no curvature, ROM normal, no CVA tenderness  Lungs:     Clear to auscultation bilaterally, respirations unlabored  Chest Wall:    No tenderness or deformity   Heart:    Regular rate and rhythm, S1 and S2 normal, no murmur, rub   or gallop  Breast Exam:    Deferred  Abdomen:     Soft, non-tender, bowel sounds active all four quadrants,    no masses, no organomegaly  Genitalia:    Deferred  Rectal:    Extremities:   Extremities normal, atraumatic  Pulses:   2+ and symmetric all extremities  Skin:   Skin color, texture, turgor normal, no rashes or lesions  Lymph nodes:   Cervical, supraclavicular, and axillary nodes normal  Neurologic:   CNII-XII intact          Assessment & Plan:

## 2023-07-14 ENCOUNTER — Encounter (HOSPITAL_BASED_OUTPATIENT_CLINIC_OR_DEPARTMENT_OTHER): Payer: Self-pay | Admitting: Physical Therapy

## 2023-07-14 ENCOUNTER — Ambulatory Visit (HOSPITAL_BASED_OUTPATIENT_CLINIC_OR_DEPARTMENT_OTHER): Attending: Orthopedic Surgery | Admitting: Physical Therapy

## 2023-07-14 ENCOUNTER — Other Ambulatory Visit: Payer: Self-pay

## 2023-07-14 DIAGNOSIS — M545 Low back pain, unspecified: Secondary | ICD-10-CM | POA: Diagnosis not present

## 2023-07-14 DIAGNOSIS — R2689 Other abnormalities of gait and mobility: Secondary | ICD-10-CM | POA: Insufficient documentation

## 2023-07-14 DIAGNOSIS — M5459 Other low back pain: Secondary | ICD-10-CM | POA: Diagnosis not present

## 2023-07-14 DIAGNOSIS — M6281 Muscle weakness (generalized): Secondary | ICD-10-CM | POA: Insufficient documentation

## 2023-07-14 NOTE — Therapy (Signed)
 OUTPATIENT PHYSICAL THERAPY THORACOLUMBAR EVALUATION   Patient Name: [name] MRN: [id number] DOB:1950/05/13, 73 y.o., female Today's Date: 07/14/2023  END OF SESSION:  PT End of Session - 07/14/23 0804     Visit Number 1    Number of Visits 16    Date for PT Re-Evaluation 09/08/23    Authorization Type [employer] MEDICARE    Progress Note Due on Visit 10    PT Start Time 0803    PT Stop Time 0840    PT Time Calculation (min) 37 min    Activity Tolerance Patient tolerated treatment well    Behavior During Therapy [facility] for tasks assessed/performed             Past Medical History:  Diagnosis Date   Anginal pain (HCC) 03/19/2012   saw Dr. Melburn Popper .. she thinks its more related to stomach issues   Arthritis    "all over; mostly in my back" (03/20/2012)   Breast cancer (HCC)    "right" (03/20/2012)   Cardiomyopathy    Chest pain 06/2005   Hospitalized, dystolic dysfunction   Chronic lower back pain    "have 2 herniated discs; going to have to have a fusion" (03/20/2012)   CKD (chronic kidney disease), stage III (HCC)    Family history of anesthesia complication    "father has nausea and vomiting"   Gallstones    GERD (gastroesophageal reflux disease)    H/O hiatal hernia    History of kidney stones    History of right mastectomy    Hypertension    "patient states never had HTN, takes Hyzaar for heart   Hypothyroidism    Irritable bowel syndrome    Migraines    "it's been a long time since I've had one" (03/20/2012)   Osteoarthritis    Peripheral neuropathy    PONV (postoperative nausea and vomiting)    Sciatica    Sinus bradycardia    Sleep apnea    mild, no CPAP   Type II diabetes mellitus (HCC)    Past Surgical History:  Procedure Laterality Date   ABDOMINAL HYSTERECTOMY  1993   adenosin cardiolite  07/2005   (+) wall motion abnormality   AXILLARY LYMPH NODE DISSECTION  2003   squamous cell cancer   BACK SURGERY     CARDIAC CATHETERIZATION  ?  2005 / 2007   CHOLECYSTECTOMY N/A 01/24/2018   Procedure: LAPAROSCOPIC CHOLECYSTECTOMY;  Surgeon: Harriette Bouillon, MD;  Location: [facility] OR;  Service: General;  Laterality: N/A;   CT abdomen and pelvis  02/2010   Same   ESOPHAGOGASTRODUODENOSCOPY  2005   GERD   KNEE ARTHROSCOPY  1990's   "right" (03/20/2012)   LUMBAR FUSION N/A 08/22/2014   Procedure: Right L2-3 and right L1-2 transforaminal lumbar interbody fusion with pedicle screws, rods, sleeves, cages, local bone graft, Vivigen, cancellous chips;  Surgeon: Kerrin Champagne, MD;  Location: [facility] OR;  Service: Orthopedics;  Laterality: N/A;   LUMBAR LAMINECTOMY/DECOMPRESSION MICRODISCECTOMY N/A 02/04/2014   Procedure: RIGHT L2-3 MICRODISCECTOMY ;  Surgeon: Kerrin Champagne, MD;  Location: [facility] OR;  Service: Orthopedics;  Laterality: N/A;   MASTECTOMY WITH AXILLARY LYMPH NODE DISSECTION  12/2003   "right" (03/20/2012)   PARATHYROIDECTOMY Left 10/11/2022   Procedure: MINIMALLY INVASIVE LEFT INFERIOR PARATHYROIDECTOMY;  Surgeon: Axel Filler, MD;  Location: [facility] OR;  Service: General;  Laterality: Left;   POSTERIOR CERVICAL FUSION/FORAMINOTOMY  2001   SHOULDER ARTHROSCOPY W/ ROTATOR CUFF REPAIR  2003   Left  THYROIDECTOMY  1977   Right   TOTAL KNEE ARTHROPLASTY  2000   Right   [place] of abdomen  07/2005   Fatty liver / kidney stones   Patient Active Problem List   Diagnosis Date Noted   Metallic taste 05/11/2023   S/P parathyroidectomy 10/11/2022   Allergic rhinitis 07/25/2022   Syncope 07/21/2022   Sinus bradycardia 07/21/2022   Hammer toe of second toe of left foot 05/05/2021   Corns and callosities 05/05/2021   Hyperlipidemia associated with type 2 diabetes mellitus (HCC) 02/01/2021   Depression 02/01/2021   Localized osteoporosis without current pathological fracture 05/01/2020   Hyperparathyroidism (HCC) 12/13/2019   Elevated hemoglobin (HCC) 12/13/2019   Chronic renal impairment, stage 4 (severe) (HCC) 10/28/2019   Anemia 10/28/2019    Physical exam 06/26/2019   Neck pain 04/18/2019   Ascending aortic aneurysm (HCC) 04/17/2019   Nonspecific chest pain 04/15/2019   Neuropathic pain of right flank 12/27/2018   Dysphagia 12/27/2018   Postsurgical hypothyroidism 06/11/2018   Vitamin D deficiency 06/11/2018   Multinodular goiter 06/11/2018   DM type 2 causing vascular disease (HCC)    Peripheral neuropathy    Migraine    Irritable bowel syndrome    Essential hypertension    History of right mastectomy    H/O hiatal hernia    GERD (gastroesophageal reflux disease)    Family history of anesthesia complication    Other chronic pain    Type 2 diabetes mellitus with diabetic chronic kidney disease (HCC)    Lower extremity edema    Chronic constipation 06/23/2015   Chronic diastolic heart failure (HCC) 11/17/2014   Herniated nucleus pulposus, L2-3 right 02/04/2014    Class: Acute   Spondylolisthesis of lumbar region 04/30/2012    Class: Chronic   Lumbar stenosis with neurogenic claudication 04/30/2012    Class: Chronic   SYNCOPE-CAROTID SINUS 09/16/2008   OSTEOARTHRITIS 08/23/2006   DDD (degenerative disc disease), cervical 08/23/2006   DISC DISEASE, LUMBAR 08/23/2006   Headache(784.0) 08/23/2006   BREAST CANCER, HX OF 08/23/2006   RENAL CALCULUS, HX OF 08/23/2006    PCP: Sheliah Hatch, MD   REFERRING PROVIDER: London Sheer, MD  REFERRING DIAG: M54.50 (ICD-10-CM) - Low back pain, unspecified back pain laterality, unspecified chronicity, unspecified whether sciatica present  Rationale for Evaluation and Treatment: Rehabilitation  THERAPY DIAG:  Other low back pain  Muscle weakness (generalized)  Other abnormalities of gait and mobility  ONSET DATE: chronic   SUBJECTIVE:  SUBJECTIVE STATEMENT: Patient states  back pain in the middle at the bottom. States that MD states there is a screw messed up from prior fusion. Patient states symptoms into RLE in back of leg to the knee when she is up and walking. Can stand/walk for about 5-10 minutes before limited by back pain. Felt she could walk better when she went to PT before. Has to be able to walk better for when she goes on vacation.   PERTINENT HISTORY:  Chronic LBP, hx Lumbar surgery, CKD, hx breast cancer, DM, neuropathy, osteoporosis, does have spinal cord stimulator  PAIN:  Are you having pain? Yes: NPRS scale: 5/10 Pain location: low back Pain description: achy Aggravating factors: walking/standing Relieving factors: laying down  PRECAUTIONS: None  WEIGHT BEARING RESTRICTIONS: No  FALLS:  Has patient fallen in last 6 months? No  PLOF: Independent  PATIENT GOALS: stand up and cook   OBJECTIVE: (objective measures from initial evaluation unless otherwise dated)  PATIENT SURVEYS: Modified Oswestry 30/50   SCREENING FOR RED FLAGS: Bowel or bladder incontinence: No Spinal tumors: No Cauda equina syndrome: No Compression fracture: No Abdominal aneurysm: No  COGNITION: Overall cognitive status: Within functional limits for tasks assessed     SENSATION: WFL  POSTURE: rounded shoulders, forward head, decreased lumbar lordosis, increased thoracic kyphosis, and flexed trunk   PALPATION: TTP lumbar and thoracic paraspinals with greatest tenderness throughout lumbar spine.  LUMBAR ROM:   AROM eval  Flexion 25% limited  Extension 90% limited *  Right lateral flexion 75% limited *  Left lateral flexion 75% limited*  Right rotation 50% limited*  Left rotation 50% limited*   (Blank rows = not tested) * = pain/symptoms  LOWER EXTREMITY ROM:     Active  Right eval Left eval  Hip flexion    Hip extension    Hip abduction    Hip adduction    Hip internal rotation    Hip external rotation    Knee flexion    Knee extension     Ankle dorsiflexion    Ankle plantarflexion    Ankle inversion    Ankle eversion     (Blank rows = not tested) * = pain/symptoms  LOWER EXTREMITY MMT:  unable to test extension as patient unable to get into prone  MMT Right eval Left eval  Hip flexion 4+ 4+  Hip extension    Hip abduction 4- 3+  Hip adduction    Hip internal rotation    Hip external rotation    Knee flexion 4+ 4+  Knee extension 4+ 4+  Ankle dorsiflexion 4+ 4+  Ankle plantarflexion    Ankle inversion    Ankle eversion     (Blank rows = not tested) * = pain/symptoms    FUNCTIONAL TESTS:  5 times sit to stand: 27.59 seconds with UE use  GAIT: Distance walked: 100 feet Assistive device utilized: Single point cane Level of assistance: Modified independence Comments: labored, decreased foot clearance  TODAY'S TREATMENT:  DATE:  07/14/23  Seated trunk flexion stretch 5 x 10 second holds Supine LTR 5 x 10 second holds   PATIENT EDUCATION:  Education details: Patient educated on exam findings, POC, scope of PT, HEP. Person educated: Patient Education method: Explanation, Demonstration, and Handouts Education comprehension: verbalized understanding, returned demonstration, verbal cues required, and tactile cues required  HOME EXERCISE PROGRAM: Access Code: [facility] URL: [email or web address] Date: 07/14/2023 Prepared by: Greig Castilla Corey Caulfield  Exercises - Seated Lumbar Flexion Stretch  - 2-3 x daily - 7 x weekly - 10 reps - 5-10 second hold - Supine Lower Trunk Rotation  - 2-3 x daily - 7 x weekly - 10 reps - 5-10 second hold  ASSESSMENT:  CLINICAL IMPRESSION: Patient a 73 y.o. y.o. female who was seen today for physical therapy evaluation and treatment for LBP. Patient presents with pain limited deficits in lumbar spine and LE strength, ROM, endurance, activity  tolerance, and functional mobility with ADL. Patient is having to modify and restrict ADL as indicated by outcome measure score as well as subjective information and objective measures which is affecting overall participation. Patient will benefit from skilled physical therapy in order to improve function and reduce impairment.  OBJECTIVE IMPAIRMENTS: Abnormal gait, decreased activity tolerance, decreased balance, decreased endurance, decreased mobility, difficulty walking, decreased ROM, decreased strength, increased muscle spasms, impaired flexibility, impaired sensation, improper body mechanics, postural dysfunction, and pain.   ACTIVITY LIMITATIONS: carrying, lifting, bending, sitting, standing, squatting, stairs, transfers, reach over head, hygiene/grooming, locomotion level, and caring for others  PARTICIPATION LIMITATIONS: meal prep, cleaning, laundry, shopping, community activity, and yard work  PERSONAL FACTORS: Fitness, Time since onset of injury/illness/exacerbation, and 3+ comorbidities: Chronic LBP, hx Lumbar surgery, CKD, hx breast cancer, DM, neuropathy, osteoporosis   are also affecting patient's functional outcome.   REHAB POTENTIAL: Good  CLINICAL DECISION MAKING: Evolving/moderate complexity  EVALUATION COMPLEXITY: Moderate   GOALS: Goals reviewed with patient? Yes  SHORT TERM GOALS: Target date: 08/11/2023    Patient will be independent with HEP in order to improve functional outcomes. Baseline:  Goal status: INITIAL  2.  Patient will report at least 25% improvement in symptoms for improved quality of life. Baseline:  Goal status: INITIAL   LONG TERM GOALS: Target date: 09/08/2023    Patient will report at least 75% improvement in symptoms for improved quality of life. Baseline:  Goal status: INITIAL  2.  Patient will improve modified oswestry score by at least 8 points in order to indicate improved tolerance to activity. Baseline:  Goal status: INITIAL  3.   Patient will demonstrate at least 25% improvement in lumbar ROM in all restricted planes for improved ability to move trunk while completing chores and cooking. Baseline:  Goal status: INITIAL  4.  Patient will be able to complete 5x STS in under 15 seconds in order to demonstrate improved functional mobility/strength Baseline:  Goal status: INITIAL  5.  Patient will demonstrate grade of 5/5 MMT grade in all tested musculature as evidence of improved strength to assist with stair ambulation and gait.   Baseline:  Goal status: INITIAL     PLAN:  PT FREQUENCY: 2x/week  PT DURATION: 8 weeks  PLANNED INTERVENTIONS: 97164- PT Re-evaluation, 97110-Therapeutic exercises, 97530- Therapeutic activity, 97112- Neuromuscular re-education, 97535- Self Care, 95621- Manual therapy, [phone]- Gait training, [phone]- Orthotic Fit/training, [phone]- Canalith repositioning, [id number]- Aquatic Therapy, [phone]- Splinting, Patient/Family education, Balance training, Stair training, Taping, Dry Needling, Joint mobilization, Joint manipulation, Spinal manipulation, Spinal mobilization, Scar mobilization, and DME instructions.  PLAN FOR NEXT SESSION: f/u with HEP, spinal mobility, core and glute and LE strength, functional strength   Reola Mosher Ceira Hoeschen, PT 07/14/2023, 8:50 AM    Referring diagnosis? M54.50 (ICD-10-CM) - Low back pain, unspecified back pain laterality, unspecified chronicity, unspecified whether sciatica present Treatment diagnosis? (if different than referring diagnosis) m54.59 What was this (referring dx) caused by? []  Surgery []  Fall [x]  Ongoing issue []  Arthritis []  Other: ____________  Laterality: []  Rt []  Lt [x]  Both  Check all possible CPT codes:  *CHOOSE 10 OR LESS*    See Planned Interventions listed in the Plan section of the Evaluation.

## 2023-07-17 ENCOUNTER — Other Ambulatory Visit: Payer: Self-pay | Admitting: Family Medicine

## 2023-07-17 ENCOUNTER — Other Ambulatory Visit (INDEPENDENT_AMBULATORY_CARE_PROVIDER_SITE_OTHER)

## 2023-07-17 DIAGNOSIS — I959 Hypotension, unspecified: Secondary | ICD-10-CM

## 2023-07-17 DIAGNOSIS — E039 Hypothyroidism, unspecified: Secondary | ICD-10-CM

## 2023-07-17 DIAGNOSIS — E559 Vitamin D deficiency, unspecified: Secondary | ICD-10-CM

## 2023-07-17 NOTE — Addendum Note (Signed)
 Addended by: Sheliah Hatch on: 07/17/2023 02:38 PM   Modules accepted: Orders

## 2023-07-18 LAB — CBC WITH DIFFERENTIAL/PLATELET
Basophils Absolute: 0.1 10*3/uL (ref 0.0–0.1)
Basophils Relative: 1.4 % (ref 0.0–3.0)
Eosinophils Absolute: 0.3 10*3/uL (ref 0.0–0.7)
Eosinophils Relative: 4.9 % (ref 0.0–5.0)
HCT: 34.8 % — ABNORMAL LOW (ref 36.0–46.0)
Hemoglobin: 11.4 g/dL — ABNORMAL LOW (ref 12.0–15.0)
Lymphocytes Relative: 34.9 % (ref 12.0–46.0)
Lymphs Abs: 2.2 10*3/uL (ref 0.7–4.0)
MCHC: 32.7 g/dL (ref 30.0–36.0)
MCV: 90.4 fl (ref 78.0–100.0)
Monocytes Absolute: 0.5 10*3/uL (ref 0.1–1.0)
Monocytes Relative: 7.7 % (ref 3.0–12.0)
Neutro Abs: 3.3 10*3/uL (ref 1.4–7.7)
Neutrophils Relative %: 51.1 % (ref 43.0–77.0)
Platelets: 286 10*3/uL (ref 150.0–400.0)
RBC: 3.86 Mil/uL — ABNORMAL LOW (ref 3.87–5.11)
RDW: 15.9 % — ABNORMAL HIGH (ref 11.5–15.5)
WBC: 6.4 10*3/uL (ref 4.0–10.5)

## 2023-07-18 LAB — LIPID PANEL
Cholesterol: 141 mg/dL (ref 0–200)
HDL: 41.4 mg/dL (ref >39.00)
LDL Cholesterol: 52 mg/dL (ref 0–99)
NonHDL: 99.8
Total CHOL/HDL Ratio: 3
Triglycerides: 241 mg/dL — ABNORMAL HIGH (ref 0.0–149.0)
VLDL: 48.2 mg/dL — ABNORMAL HIGH (ref 0.0–40.0)

## 2023-07-18 LAB — HEPATIC FUNCTION PANEL
ALT: 9 U/L (ref 0–35)
AST: 21 U/L (ref 0–37)
Albumin: 3.7 g/dL (ref 3.5–5.2)
Alkaline Phosphatase: 53 U/L (ref 39–117)
Bilirubin, Direct: 0.1 mg/dL (ref 0.0–0.3)
Total Bilirubin: 0.5 mg/dL (ref 0.2–1.2)
Total Protein: 7.1 g/dL (ref 6.0–8.3)

## 2023-07-18 LAB — VITAMIN D 25 HYDROXY (VIT D DEFICIENCY, FRACTURES): VITD: 32.9 ng/mL (ref 30.00–100.00)

## 2023-07-18 LAB — TSH: TSH: 0.45 u[IU]/mL (ref 0.35–5.50)

## 2023-07-19 ENCOUNTER — Ambulatory Visit: Admitting: Podiatry

## 2023-07-19 ENCOUNTER — Ambulatory Visit (INDEPENDENT_AMBULATORY_CARE_PROVIDER_SITE_OTHER)

## 2023-07-19 ENCOUNTER — Telehealth: Payer: Self-pay

## 2023-07-19 ENCOUNTER — Encounter: Payer: Self-pay | Admitting: Family Medicine

## 2023-07-19 DIAGNOSIS — B351 Tinea unguium: Secondary | ICD-10-CM

## 2023-07-19 DIAGNOSIS — M79675 Pain in left toe(s): Secondary | ICD-10-CM

## 2023-07-19 DIAGNOSIS — M79674 Pain in right toe(s): Secondary | ICD-10-CM

## 2023-07-19 DIAGNOSIS — M2012 Hallux valgus (acquired), left foot: Secondary | ICD-10-CM

## 2023-07-19 DIAGNOSIS — M79672 Pain in left foot: Secondary | ICD-10-CM | POA: Diagnosis not present

## 2023-07-19 NOTE — Telephone Encounter (Signed)
 Pt has been notified.

## 2023-07-19 NOTE — Telephone Encounter (Signed)
-----   Message from Neena Rhymes sent at 07/19/2023  7:41 AM EDT ----- Labs are stable and look good!  No changes at this time

## 2023-07-19 NOTE — Progress Notes (Signed)
 Chief Complaint  Patient presents with   Foot Pain    RM#6 Left foot pain X 1 year no injuries to foot pain now worsening.Callus on left foot big toe needs trimming.    SUBJECTIVE Patient with a history of diabetes mellitus; uncontrolled presents to office today complaining of elongated, thickened nails that cause pain while ambulating in shoes.  Patient is unable to trim their own nails.  Patient also complains of cold sensation to the feet bilateral as well as bunion deformity with hammertoes to the lesser digits of the left foot.  Patient is here for further evaluation and treatment.  Past Medical History:  Diagnosis Date   Anginal pain (HCC) 03/19/2012   saw Dr. Melburn Popper .. she thinks its more related to stomach issues   Arthritis    "all over; mostly in my back" (03/20/2012)   Breast cancer (HCC)    "right" (03/20/2012)   Cardiomyopathy    Chest pain 06/2005   Hospitalized, dystolic dysfunction   Chronic lower back pain    "have 2 herniated discs; going to have to have a fusion" (03/20/2012)   CKD (chronic kidney disease), stage III (HCC)    Family history of anesthesia complication    "father has nausea and vomiting"   Gallstones    GERD (gastroesophageal reflux disease)    H/O hiatal hernia    History of kidney stones    History of right mastectomy    Hypertension    "patient states never had HTN, takes Hyzaar for heart   Hypothyroidism    Irritable bowel syndrome    Migraines    "it's been a long time since I've had one" (03/20/2012)   Osteoarthritis    Peripheral neuropathy    PONV (postoperative nausea and vomiting)    Sciatica    Sinus bradycardia    Sleep apnea    mild, no CPAP   Type II diabetes mellitus (HCC)     Allergies  Allergen Reactions   Dilaudid [Hydromorphone Hcl] Nausea And Vomiting   Lipitor [Atorvastatin] Other (See Comments)    Body & Muscle Aches   Adhesive [Tape] Rash   Ampicillin Rash and Other (See Comments)    Also developed welts  on her hands when she took it in the hospital   Latex Itching, Dermatitis, Rash and Other (See Comments)    Patient had a reaction to tape after surgery and they told her she was allergic to Latex    Penicillins Rash          OBJECTIVE General Patient is awake, alert, and oriented x 3 and in no acute distress. Derm Skin is dry and supple bilateral. Negative open lesions or macerations. Remaining integument unremarkable. Nails are tender, long, thickened and dystrophic with subungual debris, consistent with onychomycosis, 1-5 bilateral. No signs of infection noted. Vasc  DP and PT pedal pulses palpable bilaterally. Temperature gradient within normal limits.  Neuro Epicritic and protective threshold sensation diminished bilaterally.  Musculoskeletal Exam hallux valgus deformity noted to the left foot with hammertoe contracture to the lesser digits Radiographic exam LT foot 07/19/2023 hallux valgus deformity noted with degenerative changes to the first TMT left foot.  Hammertoe contracture also noted.  No acute fractures identified.  ASSESSMENT 1. Diabetes Mellitus w/ peripheral neuropathy 2.  Pain due to onychomycosis of toenails bilateral  PLAN OF CARE 1. Patient evaluated today.  Comprehensive diabetic foot exam performed today. 2. Instructed to maintain good pedal hygiene and foot care. Stressed importance of  controlling blood sugar.  Last A1c on 05/12/2023 was 10.1. 3. Mechanical debridement of nails 1-5 bilaterally performed using a nail nipper. Filed with dremel without incident.  4.  Recommend conservative management of the bunion and hammertoe deformity.   5.  Return to clinic annually    Felecia Shelling, DPM [facility]  Dr. Felecia Shelling, DPM    [address] [place], [place] [address]                Office [phone]  Fax [phone]

## 2023-07-20 ENCOUNTER — Ambulatory Visit (HOSPITAL_BASED_OUTPATIENT_CLINIC_OR_DEPARTMENT_OTHER): Admitting: Physical Therapy

## 2023-07-20 ENCOUNTER — Encounter (HOSPITAL_BASED_OUTPATIENT_CLINIC_OR_DEPARTMENT_OTHER): Payer: Self-pay | Admitting: Physical Therapy

## 2023-07-20 DIAGNOSIS — M6281 Muscle weakness (generalized): Secondary | ICD-10-CM

## 2023-07-20 DIAGNOSIS — M5459 Other low back pain: Secondary | ICD-10-CM

## 2023-07-20 DIAGNOSIS — R2689 Other abnormalities of gait and mobility: Secondary | ICD-10-CM

## 2023-07-20 DIAGNOSIS — M545 Low back pain, unspecified: Secondary | ICD-10-CM | POA: Diagnosis not present

## 2023-07-20 NOTE — Therapy (Signed)
 OUTPATIENT PHYSICAL THERAPY THORACOLUMBAR TREATMENT   Patient Name: [name] MRN: [id number] DOB:21-Apr-1950, 73 y.o., female Today's Date: 07/20/2023  END OF SESSION:  PT End of Session - 07/20/23 1018     Visit Number 2    Number of Visits 16    Date for PT Re-Evaluation 09/08/23    Authorization Type [employer]ICARE    Progress Note Due on Visit 10    PT Start Time 1017    PT Stop Time 1055    PT Time Calculation (min) 38 min             Past Medical History:  Diagnosis Date   Anginal pain (HCC) 03/19/2012   saw Dr. Melburn Popper .. she thinks its more related to stomach issues   Arthritis    "all over; mostly in my back" (03/20/2012)   Breast cancer (HCC)    "right" (03/20/2012)   Cardiomyopathy    Chest pain 06/2005   Hospitalized, dystolic dysfunction   Chronic lower back pain    "have 2 herniated discs; going to have to have a fusion" (03/20/2012)   CKD (chronic kidney disease), stage III (HCC)    Family history of anesthesia complication    "father has nausea and vomiting"   Gallstones    GERD (gastroesophageal reflux disease)    H/O hiatal hernia    History of kidney stones    History of right mastectomy    Hypertension    "patient states never had HTN, takes Hyzaar for heart   Hypothyroidism    Irritable bowel syndrome    Migraines    "it's been a long time since I've had one" (03/20/2012)   Osteoarthritis    Peripheral neuropathy    PONV (postoperative nausea and vomiting)    Sciatica    Sinus bradycardia    Sleep apnea    mild, no CPAP   Type II diabetes mellitus (HCC)    Past Surgical History:  Procedure Laterality Date   ABDOMINAL HYSTERECTOMY  1993   adenosin cardiolite  07/2005   (+) wall motion abnormality   AXILLARY LYMPH NODE DISSECTION  2003   squamous cell cancer   BACK SURGERY     CARDIAC CATHETERIZATION  ? 2005 / 2007   CHOLECYSTECTOMY N/A 01/24/2018   Procedure: LAPAROSCOPIC CHOLECYSTECTOMY;  Surgeon: Harriette Bouillon, MD;   Location: [facility] OR;  Service: General;  Laterality: N/A;   CT abdomen and pelvis  02/2010   Same   ESOPHAGOGASTRODUODENOSCOPY  2005   GERD   KNEE ARTHROSCOPY  1990's   "right" (03/20/2012)   LUMBAR FUSION N/A 08/22/2014   Procedure: Right L2-3 and right L1-2 transforaminal lumbar interbody fusion with pedicle screws, rods, sleeves, cages, local bone graft, Vivigen, cancellous chips;  Surgeon: Kerrin Champagne, MD;  Location: [facility] OR;  Service: Orthopedics;  Laterality: N/A;   LUMBAR LAMINECTOMY/DECOMPRESSION MICRODISCECTOMY N/A 02/04/2014   Procedure: RIGHT L2-3 MICRODISCECTOMY ;  Surgeon: Kerrin Champagne, MD;  Location: [facility] OR;  Service: Orthopedics;  Laterality: N/A;   MASTECTOMY WITH AXILLARY LYMPH NODE DISSECTION  12/2003   "right" (03/20/2012)   PARATHYROIDECTOMY Left 10/11/2022   Procedure: MINIMALLY INVASIVE LEFT INFERIOR PARATHYROIDECTOMY;  Surgeon: Axel Filler, MD;  Location: [facility] OR;  Service: General;  Laterality: Left;   POSTERIOR CERVICAL FUSION/FORAMINOTOMY  2001   SHOULDER ARTHROSCOPY W/ ROTATOR CUFF REPAIR  2003   Left   THYROIDECTOMY  1977   Right   TOTAL KNEE ARTHROPLASTY  2000   Right   [place]  of abdomen  07/2005   Fatty liver / kidney stones   Patient Active Problem List   Diagnosis Date Noted   Metallic taste 05/11/2023   S/P parathyroidectomy 10/11/2022   Allergic rhinitis 07/25/2022   Syncope 07/21/2022   Sinus bradycardia 07/21/2022   Hammer toe of second toe of left foot 05/05/2021   Corns and callosities 05/05/2021   Hyperlipidemia associated with type 2 diabetes mellitus (HCC) 02/01/2021   Depression 02/01/2021   Localized osteoporosis without current pathological fracture 05/01/2020   Hyperparathyroidism (HCC) 12/13/2019   Elevated hemoglobin (HCC) 12/13/2019   Chronic renal impairment, stage 4 (severe) (HCC) 10/28/2019   Anemia 10/28/2019   Physical exam 06/26/2019   Neck pain 04/18/2019   Ascending aortic aneurysm (HCC) 04/17/2019   Nonspecific chest pain  04/15/2019   Neuropathic pain of right flank 12/27/2018   Dysphagia 12/27/2018   Postsurgical hypothyroidism 06/11/2018   Vitamin D deficiency 06/11/2018   Multinodular goiter 06/11/2018   DM type 2 causing vascular disease (HCC)    Peripheral neuropathy    Migraine    Irritable bowel syndrome    Essential hypertension    History of right mastectomy    H/O hiatal hernia    GERD (gastroesophageal reflux disease)    Family history of anesthesia complication    Other chronic pain    Type 2 diabetes mellitus with diabetic chronic kidney disease (HCC)    Lower extremity edema    Chronic constipation 06/23/2015   Chronic diastolic heart failure (HCC) 11/17/2014   Herniated nucleus pulposus, L2-3 right 02/04/2014    Class: Acute   Spondylolisthesis of lumbar region 04/30/2012    Class: Chronic   Lumbar stenosis with neurogenic claudication 04/30/2012    Class: Chronic   SYNCOPE-CAROTID SINUS 09/16/2008   OSTEOARTHRITIS 08/23/2006   DDD (degenerative disc disease), cervical 08/23/2006   DISC DISEASE, LUMBAR 08/23/2006   Headache(784.0) 08/23/2006   BREAST CANCER, HX OF 08/23/2006   RENAL CALCULUS, HX OF 08/23/2006    PCP: Sheliah Hatch, MD   REFERRING PROVIDER: London Sheer, MD  REFERRING DIAG: M54.50 (ICD-10-CM) - Low back pain, unspecified back pain laterality, unspecified chronicity, unspecified whether sciatica present  Rationale for Evaluation and Treatment: Rehabilitation  THERAPY DIAG:  Other low back pain  Muscle weakness (generalized)  Other abnormalities of gait and mobility  ONSET DATE: chronic   SUBJECTIVE:                                                                                                                                                                                           SUBJECTIVE STATEMENT: Patient states her back feels  worse than at eval. She reports she has been completing HEP 1x/day; LTR is painful.     PERTINENT HISTORY:   Chronic LBP, hx Lumbar surgery, CKD, hx breast cancer, DM, neuropathy, osteoporosis, does have spinal cord stimulator  PAIN:  Are you having pain? Yes: NPRS scale: 8/10 Pain location: low back Pain description: achy Aggravating factors: walking/standing Relieving factors: laying down  PRECAUTIONS: None  WEIGHT BEARING RESTRICTIONS: No  FALLS:  Has patient fallen in last 6 months? No  PLOF: Independent  PATIENT GOALS: stand up and cook   OBJECTIVE: (objective measures from initial evaluation unless otherwise dated)  PATIENT SURVEYS: Modified Oswestry 30/50   SCREENING FOR RED FLAGS: Bowel or bladder incontinence: No Spinal tumors: No Cauda equina syndrome: No Compression fracture: No Abdominal aneurysm: No  COGNITION: Overall cognitive status: Within functional limits for tasks assessed     SENSATION: WFL  POSTURE: rounded shoulders, forward head, decreased lumbar lordosis, increased thoracic kyphosis, and flexed trunk   PALPATION: TTP lumbar and thoracic paraspinals with greatest tenderness throughout lumbar spine.  LUMBAR ROM:   AROM eval  Flexion 25% limited  Extension 90% limited *  Right lateral flexion 75% limited *  Left lateral flexion 75% limited*  Right rotation 50% limited*  Left rotation 50% limited*   (Blank rows = not tested) * = pain/symptoms  LOWER EXTREMITY ROM:     Active  Right eval Left eval  Hip flexion    Hip extension    Hip abduction    Hip adduction    Hip internal rotation    Hip external rotation    Knee flexion    Knee extension    Ankle dorsiflexion    Ankle plantarflexion    Ankle inversion    Ankle eversion     (Blank rows = not tested) * = pain/symptoms  LOWER EXTREMITY MMT:  unable to test extension as patient unable to get into prone  MMT Right eval Left eval  Hip flexion 4+ 4+  Hip extension    Hip abduction 4- 3+  Hip adduction    Hip internal rotation    Hip external rotation    Knee flexion 4+  4+  Knee extension 4+ 4+  Ankle dorsiflexion 4+ 4+  Ankle plantarflexion    Ankle inversion    Ankle eversion     (Blank rows = not tested) * = pain/symptoms    FUNCTIONAL TESTS:  5 times sit to stand: 27.59 seconds with UE use  GAIT: Distance walked: 100 feet Assistive device utilized: Single point cane Level of assistance: Modified independence Comments: labored, decreased foot clearance  TODAY'S TREATMENT:                                                                                                                              DATE:  [facility] Adult PT Treatment:  DATE: 07/20/23  Therapeutic Exercise: NuStep LE, L4: 5 min  (~40 SPM) At counter: trunk ext to neutral -> side stepping R/L with cues for neutral foot Seated trunk flexion stretch -> pball roll outs forward x 10 Seated Lap press with TrA set x 5 sec x 8 reps  Therapeutic Activity: Sit to/from sidlying x 2 reps (unable to roll to back) STS with use of UE on chair x 5 (spread out throughout session) with cues for core engagement Side stepping at small counter R/L with UE support on counter Self Care: Educated on ice/heat application parameters Encouraged pt to get up from chair 1x/hour    PATIENT EDUCATION:  Education details: updated HEP; issued posture and body mechanics hand out Person educated: Patient Education method: Explanation, Demonstration, and Handouts Education comprehension: verbalized understanding, returned demonstration, verbal cues required, and tactile cues required  HOME EXERCISE PROGRAM: Access Code: [facility] URL: [email or web address]   ASSESSMENT:  CLINICAL IMPRESSION: Pt reported no increase in pain during session.  She reported improved tolerance for forward flexion stretch with arms on ball for roll out.  Pt has ball at home that she can utilize.  Pt instructed on improved body mechanics for entering bed, but was unable to  tolerate rolling onto back on treatment table today; may try again on larger mat table.   Patient will benefit from skilled physical therapy in order to improve function and reduce impairment.  OBJECTIVE IMPAIRMENTS: Abnormal gait, decreased activity tolerance, decreased balance, decreased endurance, decreased mobility, difficulty walking, decreased ROM, decreased strength, increased muscle spasms, impaired flexibility, impaired sensation, improper body mechanics, postural dysfunction, and pain.   ACTIVITY LIMITATIONS: carrying, lifting, bending, sitting, standing, squatting, stairs, transfers, reach over head, hygiene/grooming, locomotion level, and caring for others  PARTICIPATION LIMITATIONS: meal prep, cleaning, laundry, shopping, community activity, and yard work  PERSONAL FACTORS: Fitness, Time since onset of injury/illness/exacerbation, and 3+ comorbidities: Chronic LBP, hx Lumbar surgery, CKD, hx breast cancer, DM, neuropathy, osteoporosis   are also affecting patient's functional outcome.   REHAB POTENTIAL: Good  CLINICAL DECISION MAKING: Evolving/moderate complexity  EVALUATION COMPLEXITY: Moderate   GOALS: Goals reviewed with patient? Yes  SHORT TERM GOALS: Target date: 08/11/2023    Patient will be independent with HEP in order to improve functional outcomes. Baseline:  Goal status: INITIAL  2.  Patient will report at least 25% improvement in symptoms for improved quality of life. Baseline:  Goal status: INITIAL   LONG TERM GOALS: Target date: 09/08/2023    Patient will report at least 75% improvement in symptoms for improved quality of life. Baseline:  Goal status: INITIAL  2.  Patient will improve modified oswestry score by at least 8 points in order to indicate improved tolerance to activity. Baseline:  Goal status: INITIAL  3.  Patient will demonstrate at least 25% improvement in lumbar ROM in all restricted planes for improved ability to move trunk while  completing chores and cooking. Baseline:  Goal status: INITIAL  4.  Patient will be able to complete 5x STS in under 15 seconds in order to demonstrate improved functional mobility/strength Baseline:  Goal status: INITIAL  5.  Patient will demonstrate grade of 5/5 MMT grade in all tested musculature as evidence of improved strength to assist with stair ambulation and gait.   Baseline:  Goal status: INITIAL     PLAN:  PT FREQUENCY: 2x/week  PT DURATION: 8 weeks  PLANNED INTERVENTIONS: 97164- PT Re-evaluation, 97110-Therapeutic exercises, 97530- Therapeutic activity, [id number]- Neuromuscular re-education, 97535- Self Care,  40981- Manual therapy, [id number]- Gait training, 19147- Orthotic Fit/training, 82956- Canalith repositioning, 21308- Aquatic Therapy, [phone]- Splinting, Patient/Family education, Balance training, Stair training, Taping, Dry Needling, Joint mobilization, Joint manipulation, Spinal manipulation, Spinal mobilization, Scar mobilization, and DME instructions.  PLAN FOR NEXT SESSION: f/u with HEP, spinal mobility, core and glute and LE strength, functional strength  Mayer Camel, PTA 07/20/23 11:33 AM [facility] [address] [place], [place], [address] Phone: [phone]   Fax:  [phone]    Referring diagnosis? M54.50 (ICD-10-CM) - Low back pain, unspecified back pain laterality, unspecified chronicity, unspecified whether sciatica present Treatment diagnosis? (if different than referring diagnosis) m54.59 What was this (referring dx) caused by? []  Surgery []  Fall [x]  Ongoing issue []  Arthritis []  Other: ____________  Laterality: []  Rt []  Lt [x]  Both  Check all possible CPT codes:  *CHOOSE 10 OR LESS*    See Planned Interventions listed in the Plan section of the Evaluation.

## 2023-07-24 ENCOUNTER — Ambulatory Visit (HOSPITAL_COMMUNITY): Payer: Medicare HMO | Attending: Cardiology

## 2023-07-24 DIAGNOSIS — I429 Cardiomyopathy, unspecified: Secondary | ICD-10-CM | POA: Diagnosis not present

## 2023-07-24 DIAGNOSIS — R079 Chest pain, unspecified: Secondary | ICD-10-CM | POA: Diagnosis not present

## 2023-07-24 DIAGNOSIS — I712 Thoracic aortic aneurysm, without rupture, unspecified: Secondary | ICD-10-CM | POA: Insufficient documentation

## 2023-07-24 DIAGNOSIS — I10 Essential (primary) hypertension: Secondary | ICD-10-CM

## 2023-07-24 LAB — ECHOCARDIOGRAM COMPLETE
Area-P 1/2: 3.89 cm2
P 1/2 time: 761 ms
S' Lateral: 2.8 cm

## 2023-07-26 ENCOUNTER — Encounter (HOSPITAL_BASED_OUTPATIENT_CLINIC_OR_DEPARTMENT_OTHER): Admitting: Physical Therapy

## 2023-07-28 DIAGNOSIS — E1165 Type 2 diabetes mellitus with hyperglycemia: Secondary | ICD-10-CM | POA: Diagnosis not present

## 2023-07-31 ENCOUNTER — Ambulatory Visit (HOSPITAL_BASED_OUTPATIENT_CLINIC_OR_DEPARTMENT_OTHER): Admitting: Physical Therapy

## 2023-08-02 ENCOUNTER — Encounter (HOSPITAL_BASED_OUTPATIENT_CLINIC_OR_DEPARTMENT_OTHER): Payer: Self-pay | Admitting: Physical Therapy

## 2023-08-02 ENCOUNTER — Ambulatory Visit (HOSPITAL_BASED_OUTPATIENT_CLINIC_OR_DEPARTMENT_OTHER): Admitting: Physical Therapy

## 2023-08-02 DIAGNOSIS — R2689 Other abnormalities of gait and mobility: Secondary | ICD-10-CM

## 2023-08-02 DIAGNOSIS — M5459 Other low back pain: Secondary | ICD-10-CM | POA: Diagnosis not present

## 2023-08-02 DIAGNOSIS — M545 Low back pain, unspecified: Secondary | ICD-10-CM | POA: Diagnosis not present

## 2023-08-02 DIAGNOSIS — M6281 Muscle weakness (generalized): Secondary | ICD-10-CM | POA: Diagnosis not present

## 2023-08-02 NOTE — Therapy (Signed)
 OUTPATIENT PHYSICAL THERAPY THORACOLUMBAR TREATMENT   Patient Name: [name] MRN: [id number] DOB:[birth year], 73 y.o., female Today's Date: 08/02/2023  END OF SESSION:  PT End of Session - 08/02/23 1100     Visit Number 3    Number of Visits 16    Date for PT Re-Evaluation 09/08/23    Authorization Type [employer] MEDICARE    Progress Note Due on Visit 10    PT Start Time 1100    PT Stop Time 1139    PT Time Calculation (min) 39 min    Behavior During Therapy [facility] for tasks assessed/performed             Past Medical History:  Diagnosis Date   Anginal pain (HCC) 03/19/2012   saw Dr. Floria Hurst .. she thinks its more related to stomach issues   Arthritis    "all over; mostly in my back" (03/20/2012)   Breast cancer (HCC)    "right" (03/20/2012)   Cardiomyopathy    Chest pain 06/2005   Hospitalized, dystolic dysfunction   Chronic lower back pain    "have 2 herniated discs; going to have to have a fusion" (03/20/2012)   CKD (chronic kidney disease), stage III (HCC)    Family history of anesthesia complication    "father has nausea and vomiting"   Gallstones    GERD (gastroesophageal reflux disease)    H/O hiatal hernia    History of kidney stones    History of right mastectomy    Hypertension    "patient states never had HTN, takes Hyzaar for heart   Hypothyroidism    Irritable bowel syndrome    Migraines    "it's been a long time since I've had one" (03/20/2012)   Osteoarthritis    Peripheral neuropathy    PONV (postoperative nausea and vomiting)    Sciatica    Sinus bradycardia    Sleep apnea    mild, no CPAP   Type II diabetes mellitus (HCC)    Past Surgical History:  Procedure Laterality Date   ABDOMINAL HYSTERECTOMY  1993   adenosin cardiolite   07/2005   (+) wall motion abnormality   AXILLARY LYMPH NODE DISSECTION  2003   squamous cell cancer   BACK SURGERY     CARDIAC CATHETERIZATION  ? 2005 / 2007   CHOLECYSTECTOMY N/A 01/24/2018   Procedure:  LAPAROSCOPIC CHOLECYSTECTOMY;  Surgeon: Sim Dryer, MD;  Location: [facility] OR;  Service: General;  Laterality: N/A;   CT abdomen and pelvis  02/2010   Same   ESOPHAGOGASTRODUODENOSCOPY  2005   GERD   KNEE ARTHROSCOPY  1990's   "right" (03/20/2012)   LUMBAR FUSION N/A 08/22/2014   Procedure: Right L2-3 and right L1-2 transforaminal lumbar interbody fusion with pedicle screws, rods, sleeves, cages, local bone graft, Vivigen, cancellous chips;  Surgeon: Alphonso Jean, MD;  Location: [facility] OR;  Service: Orthopedics;  Laterality: N/A;   LUMBAR LAMINECTOMY/DECOMPRESSION MICRODISCECTOMY N/A 02/04/2014   Procedure: RIGHT L2-3 MICRODISCECTOMY ;  Surgeon: Alphonso Jean, MD;  Location: [facility] OR;  Service: Orthopedics;  Laterality: N/A;   MASTECTOMY WITH AXILLARY LYMPH NODE DISSECTION  12/2003   "right" (03/20/2012)   PARATHYROIDECTOMY Left 10/11/2022   Procedure: MINIMALLY INVASIVE LEFT INFERIOR PARATHYROIDECTOMY;  Surgeon: Shela Derby, MD;  Location: [facility] OR;  Service: General;  Laterality: Left;   POSTERIOR CERVICAL FUSION/FORAMINOTOMY  2001   SHOULDER ARTHROSCOPY W/ ROTATOR CUFF REPAIR  2003   Left   THYROIDECTOMY  1977   Right   TOTAL  KNEE ARTHROPLASTY  2000   Right   US  of abdomen  07/2005   Fatty liver / kidney stones   Patient Active Problem List   Diagnosis Date Noted   Metallic taste 05/11/2023   S/P parathyroidectomy 10/11/2022   Allergic rhinitis 07/25/2022   Syncope 07/21/2022   Sinus bradycardia 07/21/2022   Hammer toe of second toe of left foot 05/05/2021   Corns and callosities 05/05/2021   Hyperlipidemia associated with type 2 diabetes mellitus (HCC) 02/01/2021   Depression 02/01/2021   Localized osteoporosis without current pathological fracture 05/01/2020   Hyperparathyroidism (HCC) 12/13/2019   Elevated hemoglobin (HCC) 12/13/2019   Chronic renal impairment, stage 4 (severe) (HCC) 10/28/2019   Anemia 10/28/2019   Physical exam 06/26/2019   Neck pain 04/18/2019    Ascending aortic aneurysm (HCC) 04/17/2019   Nonspecific chest pain 04/15/2019   Neuropathic pain of right flank 12/27/2018   Dysphagia 12/27/2018   Postsurgical hypothyroidism 06/11/2018   Vitamin D  deficiency 06/11/2018   Multinodular goiter 06/11/2018   DM type 2 causing vascular disease (HCC)    Peripheral neuropathy    Migraine    Irritable bowel syndrome    Essential hypertension    History of right mastectomy    H/O hiatal hernia    GERD (gastroesophageal reflux disease)    Family history of anesthesia complication    Other chronic pain    Type 2 diabetes mellitus with diabetic chronic kidney disease (HCC)    Lower extremity edema    Chronic constipation 06/23/2015   Chronic diastolic heart failure (HCC) 11/17/2014   Herniated nucleus pulposus, L2-3 right 02/04/2014    Class: Acute   Spondylolisthesis of lumbar region 04/30/2012    Class: Chronic   Lumbar stenosis with neurogenic claudication 04/30/2012    Class: Chronic   SYNCOPE-CAROTID SINUS 09/16/2008   OSTEOARTHRITIS 08/23/2006   DDD (degenerative disc disease), cervical 08/23/2006   DISC DISEASE, LUMBAR 08/23/2006   Headache(784.0) 08/23/2006   BREAST CANCER, HX OF 08/23/2006   RENAL CALCULUS, HX OF 08/23/2006    PCP: Jess Morita, MD   REFERRING PROVIDER: Diedra Fowler, MD  REFERRING DIAG: M54.50 (ICD-10-CM) - Low back pain, unspecified back pain laterality, unspecified chronicity, unspecified whether sciatica present  Rationale for Evaluation and Treatment: Rehabilitation  THERAPY DIAG:  Other low back pain  Muscle weakness (generalized)  Other abnormalities of gait and mobility  ONSET DATE: chronic   SUBJECTIVE:  SUBJECTIVE STATEMENT: Patient states she is feeling a little better.  She reports she has  been working on pulling her belly button in. She has been walking around house some without cane.   PERTINENT HISTORY:  Chronic LBP, hx Lumbar surgery, CKD, hx breast cancer, DM, neuropathy, osteoporosis, does have spinal cord stimulator  PAIN:  Are you having pain? Yes: NPRS scale: 6/10 Pain location: low back Pain description: achy Aggravating factors: walking/standing Relieving factors: laying down  PRECAUTIONS: None  WEIGHT BEARING RESTRICTIONS: No  FALLS:  Has patient fallen in last 6 months? No  PLOF: Independent  PATIENT GOALS: stand up and cook   OBJECTIVE: (objective measures from initial evaluation unless otherwise dated)  PATIENT SURVEYS: Modified Oswestry 30/50   SCREENING FOR RED FLAGS: Bowel or bladder incontinence: No Spinal tumors: No Cauda equina syndrome: No Compression fracture: No Abdominal aneurysm: No  COGNITION: Overall cognitive status: Within functional limits for tasks assessed     SENSATION: WFL  POSTURE: rounded shoulders, forward head, decreased lumbar lordosis, increased thoracic kyphosis, and flexed trunk   PALPATION: TTP lumbar and thoracic paraspinals with greatest tenderness throughout lumbar spine.  LUMBAR ROM:   AROM eval  Flexion 25% limited  Extension 90% limited *  Right lateral flexion 75% limited *  Left lateral flexion 75% limited*  Right rotation 50% limited*  Left rotation 50% limited*   (Blank rows = not tested) * = pain/symptoms  LOWER EXTREMITY ROM:     Active  Right eval Left eval  Hip flexion    Hip extension    Hip abduction    Hip adduction    Hip internal rotation    Hip external rotation    Knee flexion    Knee extension    Ankle dorsiflexion    Ankle plantarflexion    Ankle inversion    Ankle eversion     (Blank rows = not tested) * = pain/symptoms  LOWER EXTREMITY MMT:  unable to test extension as patient unable to get into prone  MMT Right eval Left eval  Hip flexion 4+ 4+  Hip  extension    Hip abduction 4- 3+  Hip adduction    Hip internal rotation    Hip external rotation    Knee flexion 4+ 4+  Knee extension 4+ 4+  Ankle dorsiflexion 4+ 4+  Ankle plantarflexion    Ankle inversion    Ankle eversion     (Blank rows = not tested) * = pain/symptoms    FUNCTIONAL TESTS:  5 times sit to stand: 27.59 seconds with UE use  GAIT: Distance walked: 100 feet Assistive device utilized: Single point cane Level of assistance: Modified independence Comments: labored, decreased foot clearance  TODAY'S TREATMENT:                                                                                                                              DATE:  [facility] Adult PT Treatment:  DATE: 08/02/23  Therapeutic Exercise: NuStep LE, L5: 5 min  (~55 SPM) Seated trunk flexion stretch -> pball roll outs forward x 10 Seated row with red band 3 x 10  At counter: side stepping R/L with cues for neutral foot, upright trunk and head Seated Lap press with TrA set x 10 sec x 5 reps  Seated w's x 3-5 sec x 8 Therapeutic Activity: STS with forward arm reach x 10 with cues for core engagement Side stepping at small counter R/L with UE support on counter   [facility] Adult PT Treatment:                                                DATE: 07/20/23  Therapeutic Exercise: NuStep LE, L4: 5 min  (~40 SPM) At counter: trunk ext to neutral -> side stepping R/L with cues for neutral foot Seated trunk flexion stretch -> pball roll outs forward x 10 Seated Lap press with TrA set x 5 sec x 8 reps  Therapeutic Activity: Sit to/from sidlying x 2 reps (unable to roll to back) STS with use of UE on chair x 5 (spread out throughout session) with cues for core engagement Side stepping at small counter R/L with UE support on counter Self Care: Educated on ice/heat application parameters Encouraged pt to get up from chair 1x/hour    PATIENT EDUCATION:   Education details:  Person educated: Patient Education method: Programmer, multimedia, Facilities manager, a Education comprehension: verbalized understanding, returned demonstration, verbal cues required, and tactile cues required  HOME EXERCISE PROGRAM: Access Code: [facility] URL: [email or web address]   ASSESSMENT:  CLINICAL IMPRESSION: Pt demonstrated improved mobility; able to complete STS without use of UE. Good tolerance for exercises added today, without increase in pain. Advised pt to expect some exercise soreness. Will plan to progress core/back/LE strengthening as tolerated.  Patient will benefit from skilled physical therapy in order to improve function and reduce impairment.  OBJECTIVE IMPAIRMENTS: Abnormal gait, decreased activity tolerance, decreased balance, decreased endurance, decreased mobility, difficulty walking, decreased ROM, decreased strength, increased muscle spasms, impaired flexibility, impaired sensation, improper body mechanics, postural dysfunction, and pain.   ACTIVITY LIMITATIONS: carrying, lifting, bending, sitting, standing, squatting, stairs, transfers, reach over head, hygiene/grooming, locomotion level, and caring for others  PARTICIPATION LIMITATIONS: meal prep, cleaning, laundry, shopping, community activity, and yard work  PERSONAL FACTORS: Fitness, Time since onset of injury/illness/exacerbation, and 3+ comorbidities: Chronic LBP, hx Lumbar surgery, CKD, hx breast cancer, DM, neuropathy, osteoporosis   are also affecting patient's functional outcome.   REHAB POTENTIAL: Good  CLINICAL DECISION MAKING: Evolving/moderate complexity  EVALUATION COMPLEXITY: Moderate   GOALS: Goals reviewed with patient? Yes  SHORT TERM GOALS: Target date: 08/11/2023    Patient will be independent with HEP in order to improve functional outcomes. Baseline:  Goal status: INITIAL  2.  Patient will report at least 25% improvement in symptoms for improved quality  of life. Baseline:  Goal status: INITIAL   LONG TERM GOALS: Target date: 09/08/2023    Patient will report at least 75% improvement in symptoms for improved quality of life. Baseline:  Goal status: INITIAL  2.  Patient will improve modified oswestry score by at least 8 points in order to indicate improved tolerance to activity. Baseline:  Goal status: INITIAL  3.  Patient will demonstrate at least 25% improvement in lumbar ROM in all restricted  planes for improved ability to move trunk while completing chores and cooking. Baseline:  Goal status: INITIAL  4.  Patient will be able to complete 5x STS in under 15 seconds in order to demonstrate improved functional mobility/strength Baseline:  Goal status: INITIAL  5.  Patient will demonstrate grade of 5/5 MMT grade in all tested musculature as evidence of improved strength to assist with stair ambulation and gait.   Baseline:  Goal status: INITIAL     PLAN:  PT FREQUENCY: 2x/week  PT DURATION: 8 weeks  PLANNED INTERVENTIONS: 97164- PT Re-evaluation, 97110-Therapeutic exercises, 97530- Therapeutic activity, 97112- Neuromuscular re-education, 97535- Self Care, 16109- Manual therapy, [phone]- Gait training, [phone]- Orthotic Fit/training, [phone]- Canalith repositioning, [id number]- Aquatic Therapy, [phone]- Splinting, Patient/Family education, Balance training, Stair training, Taping, Dry Needling, Joint mobilization, Joint manipulation, Spinal manipulation, Spinal mobilization, Scar mobilization, and DME instructions.  PLAN FOR NEXT SESSION: f/u with HEP, spinal mobility, core and glute and LE strength, functional strength   Almedia Jacobsen, PTA 08/02/23 11:41 AM [facility] [address] [place], [place], [address] Phone: [phone]   Fax:  [phone]   Referring diagnosis? M54.50 (ICD-10-CM) - Low back pain, unspecified back pain laterality, unspecified chronicity,  unspecified whether sciatica present Treatment diagnosis? (if different than referring diagnosis) m54.59 What was this (referring dx) caused by? []  Surgery []  Fall [x]  Ongoing issue []  Arthritis []  Other: ____________  Laterality: []  Rt []  Lt [x]  Both  Check all possible CPT codes:  *CHOOSE 10 OR LESS*    See Planned Interventions listed in the Plan section of the Evaluation.

## 2023-08-04 ENCOUNTER — Ambulatory Visit (HOSPITAL_BASED_OUTPATIENT_CLINIC_OR_DEPARTMENT_OTHER): Admitting: Physical Therapy

## 2023-08-04 DIAGNOSIS — M5459 Other low back pain: Secondary | ICD-10-CM | POA: Diagnosis not present

## 2023-08-04 DIAGNOSIS — M545 Low back pain, unspecified: Secondary | ICD-10-CM | POA: Diagnosis not present

## 2023-08-04 DIAGNOSIS — M6281 Muscle weakness (generalized): Secondary | ICD-10-CM | POA: Diagnosis not present

## 2023-08-04 DIAGNOSIS — R2689 Other abnormalities of gait and mobility: Secondary | ICD-10-CM

## 2023-08-04 NOTE — Therapy (Signed)
 OUTPATIENT PHYSICAL THERAPY THORACOLUMBAR TREATMENT   Patient Name: [name] MRN: [id number] DOB:1951/02/28, 73 y.o., female Today's Date: 08/04/2023  END OF SESSION:  PT End of Session - 08/04/23 1540     Visit Number 4    Number of Visits 16    Date for PT Re-Evaluation 09/08/23    Authorization Type [employer]ICARE    Progress Note Due on Visit 10    PT Start Time 1345    PT Stop Time 1423    PT Time Calculation (min) 38 min    Activity Tolerance Patient tolerated treatment well    Behavior During Therapy [facility] for tasks assessed/performed              Past Medical History:  Diagnosis Date   Anginal pain (HCC) 03/19/2012   saw Dr. Floria Hurst .. she thinks its more related to stomach issues   Arthritis    "all over; mostly in my back" (03/20/2012)   Breast cancer (HCC)    "right" (03/20/2012)   Cardiomyopathy    Chest pain 06/2005   Hospitalized, dystolic dysfunction   Chronic lower back pain    "have 2 herniated discs; going to have to have a fusion" (03/20/2012)   CKD (chronic kidney disease), stage III (HCC)    Family history of anesthesia complication    "father has nausea and vomiting"   Gallstones    GERD (gastroesophageal reflux disease)    H/O hiatal hernia    History of kidney stones    History of right mastectomy    Hypertension    "patient states never had HTN, takes Hyzaar for heart   Hypothyroidism    Irritable bowel syndrome    Migraines    "it's been a long time since I've had one" (03/20/2012)   Osteoarthritis    Peripheral neuropathy    PONV (postoperative nausea and vomiting)    Sciatica    Sinus bradycardia    Sleep apnea    mild, no CPAP   Type II diabetes mellitus (HCC)    Past Surgical History:  Procedure Laterality Date   ABDOMINAL HYSTERECTOMY  1993   adenosin cardiolite   07/2005   (+) wall motion abnormality   AXILLARY LYMPH NODE DISSECTION  2003   squamous cell cancer   BACK SURGERY     CARDIAC CATHETERIZATION  ?  2005 / 2007   CHOLECYSTECTOMY N/A 01/24/2018   Procedure: LAPAROSCOPIC CHOLECYSTECTOMY;  Surgeon: Sim Dryer, MD;  Location: [facility] OR;  Service: General;  Laterality: N/A;   CT abdomen and pelvis  02/2010   Same   ESOPHAGOGASTRODUODENOSCOPY  2005   GERD   KNEE ARTHROSCOPY  1990's   "right" (03/20/2012)   LUMBAR FUSION N/A 08/22/2014   Procedure: Right L2-3 and right L1-2 transforaminal lumbar interbody fusion with pedicle screws, rods, sleeves, cages, local bone graft, Vivigen, cancellous chips;  Surgeon: Alphonso Jean, MD;  Location: [facility] OR;  Service: Orthopedics;  Laterality: N/A;   LUMBAR LAMINECTOMY/DECOMPRESSION MICRODISCECTOMY N/A 02/04/2014   Procedure: RIGHT L2-3 MICRODISCECTOMY ;  Surgeon: Alphonso Jean, MD;  Location: [facility] OR;  Service: Orthopedics;  Laterality: N/A;   MASTECTOMY WITH AXILLARY LYMPH NODE DISSECTION  12/2003   "right" (03/20/2012)   PARATHYROIDECTOMY Left 10/11/2022   Procedure: MINIMALLY INVASIVE LEFT INFERIOR PARATHYROIDECTOMY;  Surgeon: Shela Derby, MD;  Location: [facility] OR;  Service: General;  Laterality: Left;   POSTERIOR CERVICAL FUSION/FORAMINOTOMY  2001   SHOULDER ARTHROSCOPY W/ ROTATOR CUFF REPAIR  2003   Left  THYROIDECTOMY  1977   Right   TOTAL KNEE ARTHROPLASTY  2000   Right   US  of abdomen  07/2005   Fatty liver / kidney stones   Patient Active Problem List   Diagnosis Date Noted   Metallic taste 05/11/2023   S/P parathyroidectomy 10/11/2022   Allergic rhinitis 07/25/2022   Syncope 07/21/2022   Sinus bradycardia 07/21/2022   Hammer toe of second toe of left foot 05/05/2021   Corns and callosities 05/05/2021   Hyperlipidemia associated with type 2 diabetes mellitus (HCC) 02/01/2021   Depression 02/01/2021   Localized osteoporosis without current pathological fracture 05/01/2020   Hyperparathyroidism (HCC) 12/13/2019   Elevated hemoglobin (HCC) 12/13/2019   Chronic renal impairment, stage 4 (severe) (HCC) 10/28/2019   Anemia 10/28/2019    Physical exam 06/26/2019   Neck pain 04/18/2019   Ascending aortic aneurysm (HCC) 04/17/2019   Nonspecific chest pain 04/15/2019   Neuropathic pain of right flank 12/27/2018   Dysphagia 12/27/2018   Postsurgical hypothyroidism 06/11/2018   Vitamin D  deficiency 06/11/2018   Multinodular goiter 06/11/2018   DM type 2 causing vascular disease (HCC)    Peripheral neuropathy    Migraine    Irritable bowel syndrome    Essential hypertension    History of right mastectomy    H/O hiatal hernia    GERD (gastroesophageal reflux disease)    Family history of anesthesia complication    Other chronic pain    Type 2 diabetes mellitus with diabetic chronic kidney disease (HCC)    Lower extremity edema    Chronic constipation 06/23/2015   Chronic diastolic heart failure (HCC) 11/17/2014   Herniated nucleus pulposus, L2-3 right 02/04/2014    Class: Acute   Spondylolisthesis of lumbar region 04/30/2012    Class: Chronic   Lumbar stenosis with neurogenic claudication 04/30/2012    Class: Chronic   SYNCOPE-CAROTID SINUS 09/16/2008   OSTEOARTHRITIS 08/23/2006   DDD (degenerative disc disease), cervical 08/23/2006   DISC DISEASE, LUMBAR 08/23/2006   Headache(784.0) 08/23/2006   BREAST CANCER, HX OF 08/23/2006   RENAL CALCULUS, HX OF 08/23/2006    PCP: Jess Morita, MD   REFERRING PROVIDER: Diedra Fowler, MD  REFERRING DIAG: M54.50 (ICD-10-CM) - Low back pain, unspecified back pain laterality, unspecified chronicity, unspecified whether sciatica present  Rationale for Evaluation and Treatment: Rehabilitation  THERAPY DIAG:  Other low back pain  Muscle weakness (generalized)  Other abnormalities of gait and mobility  ONSET DATE: chronic   SUBJECTIVE:  SUBJECTIVE STATEMENT: Patient states  her daughter noticed that she is walking better.  She went to restaurant and walked without cane."The pain (in back) is coming down".  Pt would like to be able to stand longer to return to cooking in kitchen.   PERTINENT HISTORY:  Chronic LBP, hx Lumbar surgery, CKD, hx breast cancer, DM, neuropathy, osteoporosis, does have spinal cord stimulator  PAIN:  Are you having pain? Yes: NPRS scale: 5/10 Pain location: low back Pain description: achy Aggravating factors: walking/standing Relieving factors: laying down  PRECAUTIONS: None  WEIGHT BEARING RESTRICTIONS: No  FALLS:  Has patient fallen in last 6 months? No  PLOF: Independent  PATIENT GOALS: stand up and cook   OBJECTIVE: (objective measures from initial evaluation unless otherwise dated)  PATIENT SURVEYS: Modified Oswestry 30/50   SCREENING FOR RED FLAGS: Bowel or bladder incontinence: No Spinal tumors: No Cauda equina syndrome: No Compression fracture: No Abdominal aneurysm: No  COGNITION: Overall cognitive status: Within functional limits for tasks assessed     SENSATION: WFL  POSTURE: rounded shoulders, forward head, decreased lumbar lordosis, increased thoracic kyphosis, and flexed trunk   PALPATION: TTP lumbar and thoracic paraspinals with greatest tenderness throughout lumbar spine.  LUMBAR ROM:   AROM eval  Flexion 25% limited  Extension 90% limited *  Right lateral flexion 75% limited *  Left lateral flexion 75% limited*  Right rotation 50% limited*  Left rotation 50% limited*   (Blank rows = not tested) * = pain/symptoms  LOWER EXTREMITY ROM:     Active  Right eval Left eval  Hip flexion    Hip extension    Hip abduction    Hip adduction    Hip internal rotation    Hip external rotation    Knee flexion    Knee extension    Ankle dorsiflexion    Ankle plantarflexion    Ankle inversion    Ankle eversion     (Blank rows = not tested) * = pain/symptoms  LOWER EXTREMITY MMT:  unable  to test extension as patient unable to get into prone  MMT Right eval Left eval  Hip flexion 4+ 4+  Hip extension    Hip abduction 4- 3+  Hip adduction    Hip internal rotation    Hip external rotation    Knee flexion 4+ 4+  Knee extension 4+ 4+  Ankle dorsiflexion 4+ 4+  Ankle plantarflexion    Ankle inversion    Ankle eversion     (Blank rows = not tested) * = pain/symptoms    FUNCTIONAL TESTS:  5 times sit to stand: 27.59 seconds with UE use  GAIT: Distance walked: 100 feet Assistive device utilized: Single point cane Level of assistance: Modified independence Comments: labored, decreased foot clearance  TODAY'S TREATMENT:  [facility]:                                                DATE: 08/04/23 Therapeutic Exercise: NuStep LE, L4: 6 min  (~45-60 SPM) Bilat-> single arm slide up cabinet x 5 each "L " stretch at sink x 5 At counter:  Marching Hip abdct x 10  Hip ext to toe touch x 10 Seated row with red band 3 x 10  Seated w's x 3-5 sec x 3  Shoulder rolls Seated ball in/out from chest Standing trunk ext at counter (ext to neutral, from forward flexed posture) x 5sec x 3 Therapeutic Activity: STS with forward arm reach x 10 with cues for core engagement (throughout session, not all at once) Side stepping at small counter R/L with UE support on counter, with cues for neutral foot, upright trunk and head  [facility] Adult PT Treatment:                                                DATE: 08/02/23  Therapeutic Exercise: NuStep LE, L5: 5 min  (~55 SPM) Seated trunk flexion stretch -> pball roll outs forward x 10 Seated row with red band 3 x 10  At counter: side stepping R/L with cues for neutral foot, upright trunk and head Seated Lap press with TrA set x 10 sec x 5 reps  Seated w's x 3-5 sec x 8 Therapeutic Activity: STS  with forward arm reach x 10 with cues for core engagement Side stepping at small counter R/L with UE support on counter   [facility] Adult PT Treatment:                                                DATE: 07/20/23  Therapeutic Exercise: NuStep LE, L4: 5 min  (~40 SPM) At counter: trunk ext to neutral -> side stepping R/L with cues for neutral foot Seated trunk flexion stretch -> pball roll outs forward x 10 Seated Lap press with TrA set x 5 sec x 8 reps  Therapeutic Activity: Sit to/from sidlying x 2 reps (unable to roll to back) STS with use of UE on chair x 5 (spread out throughout session) with cues for core engagement Side stepping at small counter R/L with UE support on counter Self Care: Educated on ice/heat application parameters Encouraged pt to get up from chair 1x/hour    PATIENT EDUCATION:  Education details:  Person educated: Patient Education method: Programmer, multimedia, Facilities manager, a Education comprehension: verbalized understanding, returned demonstration, verbal cues required, and tactile cues required  HOME EXERCISE PROGRAM: Access Code: [facility] URL: [email or web address]   ASSESSMENT:  CLINICAL IMPRESSION: Pt reported mild increase in mid/low back pain with overhead reach, sliding UE up cabinet and pain across pecs with W position. Otherwise, pt is tolerating session well.  Educated pt on expectations of some soreness with exercises including stretches. Will plan to progress core/back/LE strengthening as tolerated.  Patient will benefit from skilled physical therapy in order to improve function and reduce impairment.  OBJECTIVE IMPAIRMENTS: Abnormal gait, decreased  activity tolerance, decreased balance, decreased endurance, decreased mobility, difficulty walking, decreased ROM, decreased strength, increased muscle spasms, impaired flexibility, impaired sensation, improper body mechanics, postural dysfunction, and pain.   ACTIVITY LIMITATIONS: carrying,  lifting, bending, sitting, standing, squatting, stairs, transfers, reach over head, hygiene/grooming, locomotion level, and caring for others  PARTICIPATION LIMITATIONS: meal prep, cleaning, laundry, shopping, community activity, and yard work  PERSONAL FACTORS: Fitness, Time since onset of injury/illness/exacerbation, and 3+ comorbidities: Chronic LBP, hx Lumbar surgery, CKD, hx breast cancer, DM, neuropathy, osteoporosis   are also affecting patient's functional outcome.   REHAB POTENTIAL: Good  CLINICAL DECISION MAKING: Evolving/moderate complexity  EVALUATION COMPLEXITY: Moderate   GOALS: Goals reviewed with patient? Yes  SHORT TERM GOALS: Target date: 08/11/2023    Patient will be independent with HEP in order to improve functional outcomes. Baseline:  Goal status: INITIAL  2.  Patient will report at least 25% improvement in symptoms for improved quality of life. Baseline:  Goal status: INITIAL   LONG TERM GOALS: Target date: 09/08/2023    Patient will report at least 75% improvement in symptoms for improved quality of life. Baseline:  Goal status: INITIAL  2.  Patient will improve modified oswestry score by at least 8 points in order to indicate improved tolerance to activity. Baseline:  Goal status: INITIAL  3.  Patient will demonstrate at least 25% improvement in lumbar ROM in all restricted planes for improved ability to move trunk while completing chores and cooking. Baseline:  Goal status: INITIAL  4.  Patient will be able to complete 5x STS in under 15 seconds in order to demonstrate improved functional mobility/strength Baseline:  Goal status: INITIAL  5.  Patient will demonstrate grade of 5/5 MMT grade in all tested musculature as evidence of improved strength to assist with stair ambulation and gait.   Baseline:  Goal status: INITIAL     PLAN:  PT FREQUENCY: 2x/week  PT DURATION: 8 weeks  PLANNED INTERVENTIONS: 97164- PT Re-evaluation,  97110-Therapeutic exercises, 97530- Therapeutic activity, 97112- Neuromuscular re-education, 97535- Self Care, 61607- Manual therapy, [phone]- Gait training, [phone]- Orthotic Fit/training, [phone]- Canalith repositioning, [id number]- Aquatic Therapy, [phone]- Splinting, Patient/Family education, Balance training, Stair training, Taping, Dry Needling, Joint mobilization, Joint manipulation, Spinal manipulation, Spinal mobilization, Scar mobilization, and DME instructions.  PLAN FOR NEXT SESSION: f/u with HEP, spinal mobility, core and glute and LE strength, functional strength   Almedia Jacobsen, PTA 08/04/23 3:41 PM [facility] [address] [place], [place], [address] Phone: [phone]   Fax:  [phone]   Referring diagnosis? M54.50 (ICD-10-CM) - Low back pain, unspecified back pain laterality, unspecified chronicity, unspecified whether sciatica present Treatment diagnosis? (if different than referring diagnosis) m54.59 What was this (referring dx) caused by? []  Surgery []  Fall [x]  Ongoing issue []  Arthritis []  Other: ____________  Laterality: []  Rt []  Lt [x]  Both  Check all possible CPT codes:  *CHOOSE 10 OR LESS*    See Planned Interventions listed in the Plan section of the Evaluation.

## 2023-08-08 ENCOUNTER — Ambulatory Visit (HOSPITAL_BASED_OUTPATIENT_CLINIC_OR_DEPARTMENT_OTHER): Admitting: Physical Therapy

## 2023-08-08 DIAGNOSIS — M6281 Muscle weakness (generalized): Secondary | ICD-10-CM

## 2023-08-08 DIAGNOSIS — R2689 Other abnormalities of gait and mobility: Secondary | ICD-10-CM | POA: Diagnosis not present

## 2023-08-08 DIAGNOSIS — M5459 Other low back pain: Secondary | ICD-10-CM

## 2023-08-08 DIAGNOSIS — M545 Low back pain, unspecified: Secondary | ICD-10-CM | POA: Diagnosis not present

## 2023-08-08 DIAGNOSIS — I10 Essential (primary) hypertension: Secondary | ICD-10-CM | POA: Diagnosis not present

## 2023-08-08 DIAGNOSIS — Z6832 Body mass index (BMI) 32.0-32.9, adult: Secondary | ICD-10-CM | POA: Diagnosis not present

## 2023-08-08 DIAGNOSIS — H6123 Impacted cerumen, bilateral: Secondary | ICD-10-CM | POA: Diagnosis not present

## 2023-08-08 NOTE — Therapy (Signed)
 OUTPATIENT PHYSICAL THERAPY THORACOLUMBAR TREATMENT   Patient Name: [name] MRN: [id number] DOB:07/15/1950, 73 y.o., female Today's Date: 08/08/2023  END OF SESSION:  PT End of Session - 08/08/23 1315     Visit Number 5    Number of Visits 16    Date for PT Re-Evaluation 09/08/23    Authorization Type [employer] MEDICARE    Progress Note Due on Visit 10    PT Start Time 1231    PT Stop Time 1309    PT Time Calculation (min) 38 min    Activity Tolerance Patient tolerated treatment well    Behavior During Therapy [facility] for tasks assessed/performed               Past Medical History:  Diagnosis Date   Anginal pain (HCC) 03/19/2012   saw Dr. Floria Hurst .. she thinks its more related to stomach issues   Arthritis    "all over; mostly in my back" (03/20/2012)   Breast cancer (HCC)    "right" (03/20/2012)   Cardiomyopathy    Chest pain 06/2005   Hospitalized, dystolic dysfunction   Chronic lower back pain    "have 2 herniated discs; going to have to have a fusion" (03/20/2012)   CKD (chronic kidney disease), stage III (HCC)    Family history of anesthesia complication    "father has nausea and vomiting"   Gallstones    GERD (gastroesophageal reflux disease)    H/O hiatal hernia    History of kidney stones    History of right mastectomy    Hypertension    "patient states never had HTN, takes Hyzaar for heart   Hypothyroidism    Irritable bowel syndrome    Migraines    "it's been a long time since I've had one" (03/20/2012)   Osteoarthritis    Peripheral neuropathy    PONV (postoperative nausea and vomiting)    Sciatica    Sinus bradycardia    Sleep apnea    mild, no CPAP   Type II diabetes mellitus (HCC)    Past Surgical History:  Procedure Laterality Date   ABDOMINAL HYSTERECTOMY  1993   adenosin cardiolite   07/2005   (+) wall motion abnormality   AXILLARY LYMPH NODE DISSECTION  2003   squamous cell cancer   BACK SURGERY     CARDIAC CATHETERIZATION  ?  2005 / 2007   CHOLECYSTECTOMY N/A 01/24/2018   Procedure: LAPAROSCOPIC CHOLECYSTECTOMY;  Surgeon: Sim Dryer, MD;  Location: [facility] OR;  Service: General;  Laterality: N/A;   CT abdomen and pelvis  02/2010   Same   ESOPHAGOGASTRODUODENOSCOPY  2005   GERD   KNEE ARTHROSCOPY  1990's   "right" (03/20/2012)   LUMBAR FUSION N/A 08/22/2014   Procedure: Right L2-3 and right L1-2 transforaminal lumbar interbody fusion with pedicle screws, rods, sleeves, cages, local bone graft, Vivigen, cancellous chips;  Surgeon: Alphonso Jean, MD;  Location: [facility] OR;  Service: Orthopedics;  Laterality: N/A;   LUMBAR LAMINECTOMY/DECOMPRESSION MICRODISCECTOMY N/A 02/04/2014   Procedure: RIGHT L2-3 MICRODISCECTOMY ;  Surgeon: Alphonso Jean, MD;  Location: [facility] OR;  Service: Orthopedics;  Laterality: N/A;   MASTECTOMY WITH AXILLARY LYMPH NODE DISSECTION  12/2003   "right" (03/20/2012)   PARATHYROIDECTOMY Left 10/11/2022   Procedure: MINIMALLY INVASIVE LEFT INFERIOR PARATHYROIDECTOMY;  Surgeon: Shela Derby, MD;  Location: [facility] OR;  Service: General;  Laterality: Left;   POSTERIOR CERVICAL FUSION/FORAMINOTOMY  2001   SHOULDER ARTHROSCOPY W/ ROTATOR CUFF REPAIR  2003   Left  THYROIDECTOMY  1977   Right   TOTAL KNEE ARTHROPLASTY  2000   Right   US  of abdomen  07/2005   Fatty liver / kidney stones   Patient Active Problem List   Diagnosis Date Noted   Metallic taste 05/11/2023   S/P parathyroidectomy 10/11/2022   Allergic rhinitis 07/25/2022   Syncope 07/21/2022   Sinus bradycardia 07/21/2022   Hammer toe of second toe of left foot 05/05/2021   Corns and callosities 05/05/2021   Hyperlipidemia associated with type 2 diabetes mellitus (HCC) 02/01/2021   Depression 02/01/2021   Localized osteoporosis without current pathological fracture 05/01/2020   Hyperparathyroidism (HCC) 12/13/2019   Elevated hemoglobin (HCC) 12/13/2019   Chronic renal impairment, stage 4 (severe) (HCC) 10/28/2019   Anemia 10/28/2019    Physical exam 06/26/2019   Neck pain 04/18/2019   Ascending aortic aneurysm (HCC) 04/17/2019   Nonspecific chest pain 04/15/2019   Neuropathic pain of right flank 12/27/2018   Dysphagia 12/27/2018   Postsurgical hypothyroidism 06/11/2018   Vitamin D  deficiency 06/11/2018   Multinodular goiter 06/11/2018   DM type 2 causing vascular disease (HCC)    Peripheral neuropathy    Migraine    Irritable bowel syndrome    Essential hypertension    History of right mastectomy    H/O hiatal hernia    GERD (gastroesophageal reflux disease)    Family history of anesthesia complication    Other chronic pain    Type 2 diabetes mellitus with diabetic chronic kidney disease (HCC)    Lower extremity edema    Chronic constipation 06/23/2015   Chronic diastolic heart failure (HCC) 11/17/2014   Herniated nucleus pulposus, L2-3 right 02/04/2014    Class: Acute   Spondylolisthesis of lumbar region 04/30/2012    Class: Chronic   Lumbar stenosis with neurogenic claudication 04/30/2012    Class: Chronic   SYNCOPE-CAROTID SINUS 09/16/2008   OSTEOARTHRITIS 08/23/2006   DDD (degenerative disc disease), cervical 08/23/2006   DISC DISEASE, LUMBAR 08/23/2006   Headache(784.0) 08/23/2006   BREAST CANCER, HX OF 08/23/2006   RENAL CALCULUS, HX OF 08/23/2006    PCP: Jess Morita, MD   REFERRING PROVIDER: Diedra Fowler, MD  REFERRING DIAG: M54.50 (ICD-10-CM) - Low back pain, unspecified back pain laterality, unspecified chronicity, unspecified whether sciatica present  Rationale for Evaluation and Treatment: Rehabilitation  THERAPY DIAG:  Other low back pain  Muscle weakness (generalized)  Other abnormalities of gait and mobility  ONSET DATE: chronic   SUBJECTIVE:  SUBJECTIVE STATEMENT: "I'm getting  better". "I can stand up longer".  Not quite back to cooking yet.   PERTINENT HISTORY:  Chronic LBP, hx Lumbar surgery, CKD, hx breast cancer, DM, neuropathy, osteoporosis, does have spinal cord stimulator  PAIN:  Are you having pain? Yes: NPRS scale: 4/10 Pain location: low back Pain description: achy Aggravating factors: walking/standing Relieving factors: laying down  PRECAUTIONS: None  WEIGHT BEARING RESTRICTIONS: No  FALLS:  Has patient fallen in last 6 months? No  PLOF: Independent  PATIENT GOALS: stand up and cook   OBJECTIVE: (objective measures from initial evaluation unless otherwise dated)  PATIENT SURVEYS: Modified Oswestry 30/50   SCREENING FOR RED FLAGS: Bowel or bladder incontinence: No Spinal tumors: No Cauda equina syndrome: No Compression fracture: No Abdominal aneurysm: No  COGNITION: Overall cognitive status: Within functional limits for tasks assessed     SENSATION: WFL  POSTURE: rounded shoulders, forward head, decreased lumbar lordosis, increased thoracic kyphosis, and flexed trunk   PALPATION: TTP lumbar and thoracic paraspinals with greatest tenderness throughout lumbar spine.  LUMBAR ROM:   AROM eval  Flexion 25% limited  Extension 90% limited *  Right lateral flexion 75% limited *  Left lateral flexion 75% limited*  Right rotation 50% limited*  Left rotation 50% limited*   (Blank rows = not tested) * = pain/symptoms  LOWER EXTREMITY ROM:     Active  Right eval Left eval  Hip flexion    Hip extension    Hip abduction    Hip adduction    Hip internal rotation    Hip external rotation    Knee flexion    Knee extension    Ankle dorsiflexion    Ankle plantarflexion    Ankle inversion    Ankle eversion     (Blank rows = not tested) * = pain/symptoms  LOWER EXTREMITY MMT:  unable to test extension as patient unable to get into prone  MMT Right eval Left eval  Hip flexion 4+ 4+  Hip extension    Hip abduction 4- 3+   Hip adduction    Hip internal rotation    Hip external rotation    Knee flexion 4+ 4+  Knee extension 4+ 4+  Ankle dorsiflexion 4+ 4+  Ankle plantarflexion    Ankle inversion    Ankle eversion     (Blank rows = not tested) * = pain/symptoms    FUNCTIONAL TESTS:  5 times sit to stand: 27.59 seconds with UE use 08/08/23:  22.5s with UE use  GAIT: Distance walked: 100 feet Assistive device utilized: Single point cane Level of assistance: Modified independence Comments: labored, decreased foot clearance  TODAY'S TREATMENT:                                                                                                                              DATE:  [facility] Adult PT Treatment:  DATE: 08/08/23 NuStep LE, L5: 6 min  (~45-60 SPM) Standing shoulder ext with red band x 10;  bilat shoulder row with red band x 10  "L " stretch at sink x 2; trunk ext to neutral STS (timed) then STS with forward arm reach x 10 total At counter:  Side stepping R/L 31ft x 3 Single arm overhead reach with ball shelf above eye level x 5-7 each arm High knee Marching Forward step ups 4" with opp arm on counter (x 8 LLE, x 5 RLE) with cues for core engagement Seated Shoulder rolls Seated 2# in/out from chest x 10  [facility] Adult PT Treatment:                                                DATE: 08/04/23 Therapeutic Exercise: NuStep LE, L4: 6 min  (~45-60 SPM) Bilat-> single arm slide up cabinet x 5 each "L " stretch at sink x 5 At counter:  Marching Hip abdct x 10  Hip ext to toe touch x 10 Seated row with red band 3 x 10  Seated w's x 3-5 sec x 3  Shoulder rolls Seated ball in/out from chest Standing trunk ext at counter (ext to neutral, from forward flexed posture) x 5sec x 3 Therapeutic Activity: STS with forward arm reach x 10 with cues for core engagement (throughout session, not all at once) Side stepping at small counter R/L with UE support on counter,  with cues for neutral foot, upright trunk and head  [facility] Adult PT Treatment:                                                DATE: 08/02/23  Therapeutic Exercise: NuStep LE, L5: 5 min  (~55 SPM) Seated trunk flexion stretch -> pball roll outs forward x 10 Seated row with red band 3 x 10  At counter: side stepping R/L with cues for neutral foot, upright trunk and head Seated Lap press with TrA set x 10 sec x 5 reps  Seated w's x 3-5 sec x 8 Therapeutic Activity: STS with forward arm reach x 10 with cues for core engagement Side stepping at small counter R/L with UE support on counter   [facility] Adult PT Treatment:                                                DATE: 07/20/23  Therapeutic Exercise: NuStep LE, L4: 5 min  (~40 SPM) At counter: trunk ext to neutral -> side stepping R/L with cues for neutral foot Seated trunk flexion stretch -> pball roll outs forward x 10 Seated Lap press with TrA set x 5 sec x 8 reps  Therapeutic Activity: Sit to/from sidlying x 2 reps (unable to roll to back) STS with use of UE on chair x 5 (spread out throughout session) with cues for core engagement Side stepping at small counter R/L with UE support on counter Self Care: Educated on ice/heat application parameters Encouraged pt to get up from chair 1x/hour    PATIENT EDUCATION:  Education details: exercise rationale, progression/modification  Person educated: Patient Education method: Explanation, Demonstration, Education comprehension: verbalized understanding, returned demonstration, verbal cues required, and tactile cues required  HOME EXERCISE PROGRAM: Access Code: [facility] URL: [email or web address]   ASSESSMENT:  CLINICAL IMPRESSION: Pt demonstrated improved STS time.  She is observed with gradual improvement of more upright posture in standing. She tolerated session well without increase in pain.   Patient will benefit from skilled physical therapy in order to improve  function and reduce impairment. Pt is progressing towards goals. Therapist to ck STG2 next visit.   OBJECTIVE IMPAIRMENTS: Abnormal gait, decreased activity tolerance, decreased balance, decreased endurance, decreased mobility, difficulty walking, decreased ROM, decreased strength, increased muscle spasms, impaired flexibility, impaired sensation, improper body mechanics, postural dysfunction, and pain.   ACTIVITY LIMITATIONS: carrying, lifting, bending, sitting, standing, squatting, stairs, transfers, reach over head, hygiene/grooming, locomotion level, and caring for others  PARTICIPATION LIMITATIONS: meal prep, cleaning, laundry, shopping, community activity, and yard work  PERSONAL FACTORS: Fitness, Time since onset of injury/illness/exacerbation, and 3+ comorbidities: Chronic LBP, hx Lumbar surgery, CKD, hx breast cancer, DM, neuropathy, osteoporosis   are also affecting patient's functional outcome.   REHAB POTENTIAL: Good  CLINICAL DECISION MAKING: Evolving/moderate complexity  EVALUATION COMPLEXITY: Moderate   GOALS: Goals reviewed with patient? Yes  SHORT TERM GOALS: Target date: 08/11/2023    Patient will be independent with HEP in order to improve functional outcomes. Baseline:  Goal status: INITIAL  2.  Patient will report at least 25% improvement in symptoms for improved quality of life. Baseline:  Goal status: INITIAL   LONG TERM GOALS: Target date: 09/08/2023    Patient will report at least 75% improvement in symptoms for improved quality of life. Baseline:  Goal status: INITIAL  2.  Patient will improve modified oswestry score by at least 8 points in order to indicate improved tolerance to activity. Baseline:  Goal status: INITIAL  3.  Patient will demonstrate at least 25% improvement in lumbar ROM in all restricted planes for improved ability to move trunk while completing chores and cooking. Baseline:  Goal status: INITIAL  4.  Patient will be able to  complete 5x STS in under 15 seconds in order to demonstrate improved functional mobility/strength Baseline: see above Goal status: In progress - 08/08/23  5.  Patient will demonstrate grade of 5/5 MMT grade in all tested musculature as evidence of improved strength to assist with stair ambulation and gait.   Baseline:  Goal status: INITIAL     PLAN:  PT FREQUENCY: 2x/week  PT DURATION: 8 weeks  PLANNED INTERVENTIONS: 97164- PT Re-evaluation, 97110-Therapeutic exercises, 97530- Therapeutic activity, 97112- Neuromuscular re-education, 97535- Self Care, 96045- Manual therapy, [phone]- Gait training, [phone]- Orthotic Fit/training, [phone]- Canalith repositioning, [id number]- Aquatic Therapy, [phone]- Splinting, Patient/Family education, Balance training, Stair training, Taping, Dry Needling, Joint mobilization, Joint manipulation, Spinal manipulation, Spinal mobilization, Scar mobilization, and DME instructions.  PLAN FOR NEXT SESSION: f/u with HEP, spinal mobility, core and glute and LE strength, functional strength   Almedia Jacobsen, PTA 08/08/23 1:15 PM [facility] [address] [place], [place], [address] Phone: [phone]   Fax:  [phone]    Referring diagnosis? M54.50 (ICD-10-CM) - Low back pain, unspecified back pain laterality, unspecified chronicity, unspecified whether sciatica present Treatment diagnosis? (if different than referring diagnosis) m54.59 What was this (referring dx) caused by? []  Surgery []  Fall [x]  Ongoing issue []  Arthritis []  Other: ____________  Laterality: []  Rt []  Lt [x]  Both  Check all possible CPT codes:  *CHOOSE  10 OR LESS*    See Planned Interventions listed in the Plan section of the Evaluation.

## 2023-08-09 ENCOUNTER — Encounter: Payer: Self-pay | Admitting: Family Medicine

## 2023-08-09 DIAGNOSIS — R32 Unspecified urinary incontinence: Secondary | ICD-10-CM | POA: Insufficient documentation

## 2023-08-09 NOTE — Assessment & Plan Note (Signed)
 New to provider, ongoing for pt.  Will start Myrbetriq  and monitor for improvement.

## 2023-08-09 NOTE — Assessment & Plan Note (Signed)
 Pt's PE unchanged from previous.  UTD on colonoscopy, mammo, immunizations.  She reports feeling well w/ exception of ongoing back issues, seasonal allergies, and urinary incontinence.  Will restart allergy medication, add Myrbetriq .  Check labs.  Anticipatory guidance provided.

## 2023-08-11 ENCOUNTER — Encounter (HOSPITAL_BASED_OUTPATIENT_CLINIC_OR_DEPARTMENT_OTHER): Payer: Self-pay

## 2023-08-11 ENCOUNTER — Ambulatory Visit (HOSPITAL_BASED_OUTPATIENT_CLINIC_OR_DEPARTMENT_OTHER): Attending: Orthopedic Surgery

## 2023-08-11 DIAGNOSIS — R2689 Other abnormalities of gait and mobility: Secondary | ICD-10-CM | POA: Insufficient documentation

## 2023-08-11 DIAGNOSIS — M5459 Other low back pain: Secondary | ICD-10-CM | POA: Diagnosis not present

## 2023-08-11 DIAGNOSIS — M6281 Muscle weakness (generalized): Secondary | ICD-10-CM | POA: Insufficient documentation

## 2023-08-11 NOTE — Therapy (Signed)
 OUTPATIENT PHYSICAL THERAPY THORACOLUMBAR TREATMENT   Patient Name: [name] MRN: [id number] DOB:02/13/1951, 73 y.o., female Today's Date: 08/11/2023  END OF SESSION:  PT End of Session - 08/11/23 1055     Visit Number 6    Number of Visits 16    Date for PT Re-Evaluation 09/08/23    Authorization Type [employer]ICARE    Progress Note Due on Visit 10    PT Start Time 1058    PT Stop Time 1145    PT Time Calculation (min) 47 min    Activity Tolerance Patient tolerated treatment well    Behavior During Therapy [facility] for tasks assessed/performed                Past Medical History:  Diagnosis Date   Anginal pain (HCC) 03/19/2012   saw Dr. Floria Hurst .. she thinks its more related to stomach issues   Arthritis    "all over; mostly in my back" (03/20/2012)   Breast cancer (HCC)    "right" (03/20/2012)   Cardiomyopathy    Chest pain 06/2005   Hospitalized, dystolic dysfunction   Chronic lower back pain    "have 2 herniated discs; going to have to have a fusion" (03/20/2012)   CKD (chronic kidney disease), stage III (HCC)    Family history of anesthesia complication    "father has nausea and vomiting"   Gallstones    GERD (gastroesophageal reflux disease)    H/O hiatal hernia    History of kidney stones    History of right mastectomy    Hypertension    "patient states never had HTN, takes Hyzaar for heart   Hypothyroidism    Irritable bowel syndrome    Migraines    "it's been a long time since I've had one" (03/20/2012)   Osteoarthritis    Peripheral neuropathy    PONV (postoperative nausea and vomiting)    Sciatica    Sinus bradycardia    Sleep apnea    mild, no CPAP   Type II diabetes mellitus (HCC)    Past Surgical History:  Procedure Laterality Date   ABDOMINAL HYSTERECTOMY  1993   adenosin cardiolite   07/2005   (+) wall motion abnormality   AXILLARY LYMPH NODE DISSECTION  2003   squamous cell cancer   BACK SURGERY     CARDIAC CATHETERIZATION   ? 2005 / 2007   CHOLECYSTECTOMY N/A 01/24/2018   Procedure: LAPAROSCOPIC CHOLECYSTECTOMY;  Surgeon: Sim Dryer, MD;  Location: [facility] OR;  Service: General;  Laterality: N/A;   CT abdomen and pelvis  02/2010   Same   ESOPHAGOGASTRODUODENOSCOPY  2005   GERD   KNEE ARTHROSCOPY  1990's   "right" (03/20/2012)   LUMBAR FUSION N/A 08/22/2014   Procedure: Right L2-3 and right L1-2 transforaminal lumbar interbody fusion with pedicle screws, rods, sleeves, cages, local bone graft, Vivigen, cancellous chips;  Surgeon: Alphonso Jean, MD;  Location: [facility] OR;  Service: Orthopedics;  Laterality: N/A;   LUMBAR LAMINECTOMY/DECOMPRESSION MICRODISCECTOMY N/A 02/04/2014   Procedure: RIGHT L2-3 MICRODISCECTOMY ;  Surgeon: Alphonso Jean, MD;  Location: [facility] OR;  Service: Orthopedics;  Laterality: N/A;   MASTECTOMY WITH AXILLARY LYMPH NODE DISSECTION  12/2003   "right" (03/20/2012)   PARATHYROIDECTOMY Left 10/11/2022   Procedure: MINIMALLY INVASIVE LEFT INFERIOR PARATHYROIDECTOMY;  Surgeon: Shela Derby, MD;  Location: [facility] OR;  Service: General;  Laterality: Left;   POSTERIOR CERVICAL FUSION/FORAMINOTOMY  2001   SHOULDER ARTHROSCOPY W/ ROTATOR CUFF REPAIR  2003  Left   THYROIDECTOMY  1977   Right   TOTAL KNEE ARTHROPLASTY  2000   Right   US  of abdomen  07/2005   Fatty liver / kidney stones   Patient Active Problem List   Diagnosis Date Noted   Urinary incontinence 08/09/2023   Metallic taste 05/11/2023   S/P parathyroidectomy 10/11/2022   Allergic rhinitis 07/25/2022   Syncope 07/21/2022   Sinus bradycardia 07/21/2022   Hammer toe of second toe of left foot 05/05/2021   Corns and callosities 05/05/2021   Hyperlipidemia associated with type 2 diabetes mellitus (HCC) 02/01/2021   Depression 02/01/2021   Localized osteoporosis without current pathological fracture 05/01/2020   Hyperparathyroidism (HCC) 12/13/2019   Elevated hemoglobin (HCC) 12/13/2019   Chronic renal impairment, stage 4 (severe)  (HCC) 10/28/2019   Anemia 10/28/2019   Physical exam 06/26/2019   Neck pain 04/18/2019   Ascending aortic aneurysm (HCC) 04/17/2019   Nonspecific chest pain 04/15/2019   Neuropathic pain of right flank 12/27/2018   Dysphagia 12/27/2018   Postsurgical hypothyroidism 06/11/2018   Vitamin D  deficiency 06/11/2018   Multinodular goiter 06/11/2018   DM type 2 causing vascular disease (HCC)    Peripheral neuropathy    Migraine    Irritable bowel syndrome    Essential hypertension    History of right mastectomy    H/O hiatal hernia    GERD (gastroesophageal reflux disease)    Family history of anesthesia complication    Other chronic pain    Type 2 diabetes mellitus with diabetic chronic kidney disease (HCC)    Lower extremity edema    Chronic constipation 06/23/2015   Chronic diastolic heart failure (HCC) 11/17/2014   Herniated nucleus pulposus, L2-3 right 02/04/2014    Class: Acute   Spondylolisthesis of lumbar region 04/30/2012    Class: Chronic   Lumbar stenosis with neurogenic claudication 04/30/2012    Class: Chronic   SYNCOPE-CAROTID SINUS 09/16/2008   OSTEOARTHRITIS 08/23/2006   DDD (degenerative disc disease), cervical 08/23/2006   DISC DISEASE, LUMBAR 08/23/2006   Headache(784.0) 08/23/2006   BREAST CANCER, HX OF 08/23/2006   RENAL CALCULUS, HX OF 08/23/2006    PCP: Jess Morita, MD   REFERRING PROVIDER: Diedra Fowler, MD  REFERRING DIAG: M54.50 (ICD-10-CM) - Low back pain, unspecified back pain laterality, unspecified chronicity, unspecified whether sciatica present  Rationale for Evaluation and Treatment: Rehabilitation  THERAPY DIAG:  Muscle weakness (generalized)  Other abnormalities of gait and mobility  Other low back pain  ONSET DATE: chronic   SUBJECTIVE:  SUBJECTIVE STATEMENT: Pt reports increased stiffness this morning. Was out with her daughter getting her hearing aids and feels that may have contributed. Back and L leg are stiff. Arrives with [facility].   PERTINENT HISTORY:  Chronic LBP, hx Lumbar surgery, CKD, hx breast cancer, DM, neuropathy, osteoporosis, does have spinal cord stimulator  PAIN:  Are you having pain? Yes: NPRS scale: 4/10 Pain location: low back Pain description: achy Aggravating factors: walking/standing Relieving factors: laying down  PRECAUTIONS: None  WEIGHT BEARING RESTRICTIONS: No  FALLS:  Has patient fallen in last 6 months? No  PLOF: Independent  PATIENT GOALS: stand up and cook   OBJECTIVE: (objective measures from initial evaluation unless otherwise dated)  PATIENT SURVEYS: Modified Oswestry 30/50   SCREENING FOR RED FLAGS: Bowel or bladder incontinence: No Spinal tumors: No Cauda equina syndrome: No Compression fracture: No Abdominal aneurysm: No  COGNITION: Overall cognitive status: Within functional limits for tasks assessed     SENSATION: WFL  POSTURE: rounded shoulders, forward head, decreased lumbar lordosis, increased thoracic kyphosis, and flexed trunk   PALPATION: TTP lumbar and thoracic paraspinals with greatest tenderness throughout lumbar spine.  LUMBAR ROM:   AROM eval  Flexion 25% limited  Extension 90% limited *  Right lateral flexion 75% limited *  Left lateral flexion 75% limited*  Right rotation 50% limited*  Left rotation 50% limited*   (Blank rows = not tested) * = pain/symptoms  LOWER EXTREMITY ROM:     Active  Right eval Left eval  Hip flexion    Hip extension    Hip abduction    Hip adduction    Hip internal rotation    Hip external rotation    Knee flexion    Knee extension    Ankle dorsiflexion    Ankle plantarflexion    Ankle inversion    Ankle eversion     (Blank rows = not tested) * = pain/symptoms  LOWER EXTREMITY MMT:  unable to  test extension as patient unable to get into prone  MMT Right eval Left eval  Hip flexion 4+ 4+  Hip extension    Hip abduction 4- 3+  Hip adduction    Hip internal rotation    Hip external rotation    Knee flexion 4+ 4+  Knee extension 4+ 4+  Ankle dorsiflexion 4+ 4+  Ankle plantarflexion    Ankle inversion    Ankle eversion     (Blank rows = not tested) * = pain/symptoms    FUNCTIONAL TESTS:  5 times sit to stand: 27.59 seconds with UE use 08/08/23:  22.5s with UE use  GAIT: Distance walked: 100 feet Assistive device utilized: Single point cane Level of assistance: Modified independence Comments: labored, decreased foot clearance  TODAY'S TREATMENT:                                                                                                                              DATE:  [facility] Adult PT Treatment:                                                DATE: 08/11/23 NuStep LE/UE, L5: 6 min   Standing shoulder ext with red band x 10;  bilat shoulder row with red band x 10  Seated ball roll stretch on table for lumbar flexion 5" x10 STS with forward arm reach x 10  (slightly elevated plinth) At rail:  Side stepping x2laps  Single arm overhead reach with ball above eye level x10each arm straight up, x10each diagonal Standing march 2x10 HR/TR x20 (with core activation) Forward step ups 4" with opp arm on rail (x 10 LLE, x 10 RLE) with cues for core engagement Seated Shoulder rolls x15 Seated 2# in/out from chest x 15 Seated marching 2x10ea Seated LAQ 2x10ea Gait in hall with [facility] x1 lap   [facility] Adult PT Treatment:                                                DATE: 08/08/23 NuStep LE, L5: 6 min  (~45-60 SPM) Standing shoulder ext with red band x 10;  bilat shoulder row with red band x 10  "L " stretch at sink x 2; trunk ext to neutral STS (timed) then STS with forward arm reach x 10 total At counter:  Side stepping R/L 46ft x 3 Single arm overhead reach with ball  shelf above eye level x 5-7 each arm High knee Marching Forward step ups 4" with opp arm on counter (x 8 LLE, x 5 RLE) with cues for core engagement Seated Shoulder rolls Seated 2# in/out from chest x 10  [facility] Adult PT Treatment:                                                DATE: 08/04/23 Therapeutic Exercise: NuStep LE, L4: 6 min  (~45-60 SPM) Bilat-> single arm slide up cabinet x 5 each "L " stretch at sink x 5 At counter:  Marching Hip abdct x 10  Hip ext to toe touch x 10 Seated row with red band 3 x 10  Seated w's x 3-5 sec x 3  Shoulder rolls Seated ball in/out from chest Standing trunk ext at counter (ext to neutral, from forward flexed posture) x 5sec x 3 Therapeutic Activity: STS with forward arm reach x 10 with cues for core engagement (throughout session, not all at once) Side stepping at small counter R/L with UE support on counter, with cues for neutral foot, upright trunk and head  [facility] Adult PT Treatment:                                                DATE: 08/02/23  Therapeutic Exercise: NuStep LE, L5: 5 min  (~55 SPM) Seated trunk flexion stretch -> pball roll outs forward x 10 Seated row with red band 3 x 10  At counter: side stepping R/L  with cues for neutral foot, upright trunk and head Seated Lap press with TrA set x 10 sec x 5 reps  Seated w's x 3-5 sec x 8 Therapeutic Activity: STS with forward arm reach x 10 with cues for core engagement Side stepping at small counter R/L with UE support on counter   [facility] Adult PT Treatment:                                                DATE: 07/20/23  Therapeutic Exercise: NuStep LE, L4: 5 min  (~40 SPM) At counter: trunk ext to neutral -> side stepping R/L with cues for neutral foot Seated trunk flexion stretch -> pball roll outs forward x 10 Seated Lap press with TrA set x 5 sec x 8 reps  Therapeutic Activity: Sit to/from sidlying x 2 reps (unable to roll to back) STS with use of UE on chair x 5 (spread out  throughout session) with cues for core engagement Side stepping at small counter R/L with UE support on counter Self Care: Educated on ice/heat application parameters Encouraged pt to get up from chair 1x/hour    PATIENT EDUCATION:  Education details: exercise rationale, progression/modification  Person educated: Patient Education method: Programmer, multimedia, Facilities manager, Education comprehension: verbalized understanding, returned demonstration, verbal cues required, and tactile cues required  HOME EXERCISE PROGRAM: Access Code: [facility] URL: [email or web address]   ASSESSMENT:  CLINICAL IMPRESSION: Pt has met STG #2 and working towards STG #1. She reports overall improvement in pain level with PT. Worked on gentle lumbar stretching at start of session which she felt benefit from. Good tolerance for standing exercises, though she did require occasional seated rest break. Provided verbal cuing for core activation with exercises, which she reported decreased lumbar pain with. By end of session, she reported improved mobility with ambulation than when she first came in.   OBJECTIVE IMPAIRMENTS: Abnormal gait, decreased activity tolerance, decreased balance, decreased endurance, decreased mobility, difficulty walking, decreased ROM, decreased strength, increased muscle spasms, impaired flexibility, impaired sensation, improper body mechanics, postural dysfunction, and pain.   ACTIVITY LIMITATIONS: carrying, lifting, bending, sitting, standing, squatting, stairs, transfers, reach over head, hygiene/grooming, locomotion level, and caring for others  PARTICIPATION LIMITATIONS: meal prep, cleaning, laundry, shopping, community activity, and yard work  PERSONAL FACTORS: Fitness, Time since onset of injury/illness/exacerbation, and 3+ comorbidities: Chronic LBP, hx Lumbar surgery, CKD, hx breast cancer, DM, neuropathy, osteoporosis   are also affecting patient's functional outcome.    REHAB POTENTIAL: Good  CLINICAL DECISION MAKING: Evolving/moderate complexity  EVALUATION COMPLEXITY: Moderate   GOALS: Goals reviewed with patient? Yes  SHORT TERM GOALS: Target date: 08/11/2023    Patient will be independent with HEP in order to improve functional outcomes. Baseline:  Goal status: IN PROGRESS (performs them "a little bit.") 08/11/23  2.  Patient will report at least 25% improvement in symptoms for improved quality of life. Baseline:  Goal status: MET 08/11/23   LONG TERM GOALS: Target date: 09/08/2023    Patient will report at least 75% improvement in symptoms for improved quality of life. Baseline:  Goal status: INITIAL  2.  Patient will improve modified oswestry score by at least 8 points in order to indicate improved tolerance to activity. Baseline:  Goal status: INITIAL  3.  Patient will demonstrate at least 25% improvement in lumbar ROM in all restricted planes for improved  ability to move trunk while completing chores and cooking. Baseline:  Goal status: INITIAL  4.  Patient will be able to complete 5x STS in under 15 seconds in order to demonstrate improved functional mobility/strength Baseline: see above Goal status: In progress - 08/08/23  5.  Patient will demonstrate grade of 5/5 MMT grade in all tested musculature as evidence of improved strength to assist with stair ambulation and gait.   Baseline:  Goal status: INITIAL     PLAN:  PT FREQUENCY: 2x/week  PT DURATION: 8 weeks  PLANNED INTERVENTIONS: 97164- PT Re-evaluation, 97110-Therapeutic exercises, 97530- Therapeutic activity, 97112- Neuromuscular re-education, 97535- Self Care, [id number]- Manual therapy, [phone]- Gait training, [phone]- Orthotic Fit/training, [phone]- Canalith repositioning, [id number]- Aquatic Therapy, [phone]- Splinting, Patient/Family education, Balance training, Stair training, Taping, Dry Needling, Joint mobilization, Joint manipulation, Spinal manipulation, Spinal mobilization,  Scar mobilization, and DME instructions.  PLAN FOR NEXT SESSION: f/u with HEP, spinal mobility, core and glute and LE strength, functional strength   Almedia Jacobsen, PTA 08/11/23 12:07 PM [facility] [address] [place], [place], [address] Phone: [phone]   Fax:  [phone]    Referring diagnosis? M54.50 (ICD-10-CM) - Low back pain, unspecified back pain laterality, unspecified chronicity, unspecified whether sciatica present Treatment diagnosis? (if different than referring diagnosis) m54.59 What was this (referring dx) caused by? []  Surgery []  Fall [x]  Ongoing issue []  Arthritis []  Other: ____________  Laterality: []  Rt []  Lt [x]  Both  Check all possible CPT codes:  *CHOOSE 10 OR LESS*    See Planned Interventions listed in the Plan section of the Evaluation.

## 2023-08-14 ENCOUNTER — Encounter: Payer: Self-pay | Admitting: Nurse Practitioner

## 2023-08-14 ENCOUNTER — Ambulatory Visit (INDEPENDENT_AMBULATORY_CARE_PROVIDER_SITE_OTHER): Payer: Medicare HMO | Admitting: Nurse Practitioner

## 2023-08-14 VITALS — BP 134/80 | HR 58 | Ht 64.0 in | Wt 196.0 lb

## 2023-08-14 DIAGNOSIS — E1159 Type 2 diabetes mellitus with other circulatory complications: Secondary | ICD-10-CM | POA: Diagnosis not present

## 2023-08-14 DIAGNOSIS — E559 Vitamin D deficiency, unspecified: Secondary | ICD-10-CM | POA: Diagnosis not present

## 2023-08-14 DIAGNOSIS — I10 Essential (primary) hypertension: Secondary | ICD-10-CM

## 2023-08-14 DIAGNOSIS — Z794 Long term (current) use of insulin: Secondary | ICD-10-CM | POA: Diagnosis not present

## 2023-08-14 DIAGNOSIS — Z7984 Long term (current) use of oral hypoglycemic drugs: Secondary | ICD-10-CM | POA: Diagnosis not present

## 2023-08-14 DIAGNOSIS — E890 Postprocedural hypothyroidism: Secondary | ICD-10-CM | POA: Diagnosis not present

## 2023-08-14 DIAGNOSIS — Z789 Other specified health status: Secondary | ICD-10-CM

## 2023-08-14 DIAGNOSIS — N189 Chronic kidney disease, unspecified: Secondary | ICD-10-CM

## 2023-08-14 DIAGNOSIS — E042 Nontoxic multinodular goiter: Secondary | ICD-10-CM | POA: Diagnosis not present

## 2023-08-14 DIAGNOSIS — E039 Hypothyroidism, unspecified: Secondary | ICD-10-CM

## 2023-08-14 LAB — POCT GLYCOSYLATED HEMOGLOBIN (HGB A1C): Hemoglobin A1C: 8.7 % — AB (ref 4.0–5.6)

## 2023-08-14 NOTE — Progress Notes (Signed)
 08/14/2023, 11:33 AM   Endocrinology follow-up note   Subjective:    Patient ID: [name], female    DOB: Oct 18, 1950.  [name] is being seen in follow-up for management of currently uncontrolled,complicated symptomatic type 2 diabetes and associated hypertension, hypothyroidism. PMD :  Jess Morita, MD.   Past Medical History:  Diagnosis Date   Anginal pain (HCC) 03/19/2012   saw Dr. Floria Hurst .. she thinks its more related to stomach issues   Arthritis    "all over; mostly in my back" (03/20/2012)   Breast cancer (HCC)    "right" (03/20/2012)   Cardiomyopathy    Chest pain 06/2005   Hospitalized, dystolic dysfunction   Chronic lower back pain    "have 2 herniated discs; going to have to have a fusion" (03/20/2012)   CKD (chronic kidney disease), stage III (HCC)    Family history of anesthesia complication    "father has nausea and vomiting"   Gallstones    GERD (gastroesophageal reflux disease)    H/O hiatal hernia    History of kidney stones    History of right mastectomy    Hypertension    "patient states never had HTN, takes Hyzaar for heart   Hypothyroidism    Irritable bowel syndrome    Migraines    "it's been a long time since I've had one" (03/20/2012)   Osteoarthritis    Peripheral neuropathy    PONV (postoperative nausea and vomiting)    Sciatica    Sinus bradycardia    Sleep apnea    mild, no CPAP   Type II diabetes mellitus (HCC)    Past Surgical History:  Procedure Laterality Date   ABDOMINAL HYSTERECTOMY  1993   adenosin cardiolite   07/2005   (+) wall motion abnormality   AXILLARY LYMPH NODE DISSECTION  2003   squamous cell cancer   BACK SURGERY     CARDIAC CATHETERIZATION  ? 2005 / 2007   CHOLECYSTECTOMY N/A 01/24/2018   Procedure: LAPAROSCOPIC CHOLECYSTECTOMY;  Surgeon: Sim Dryer, MD;  Location: [facility] OR;  Service: General;  Laterality: N/A;    CT abdomen and pelvis  02/2010   Same   ESOPHAGOGASTRODUODENOSCOPY  2005   GERD   KNEE ARTHROSCOPY  1990's   "right" (03/20/2012)   LUMBAR FUSION N/A 08/22/2014   Procedure: Right L2-3 and right L1-2 transforaminal lumbar interbody fusion with pedicle screws, rods, sleeves, cages, local bone graft, Vivigen, cancellous chips;  Surgeon: Alphonso Jean, MD;  Location: [facility] OR;  Service: Orthopedics;  Laterality: N/A;   LUMBAR LAMINECTOMY/DECOMPRESSION MICRODISCECTOMY N/A 02/04/2014   Procedure: RIGHT L2-3 MICRODISCECTOMY ;  Surgeon: Alphonso Jean, MD;  Location: [facility] OR;  Service: Orthopedics;  Laterality: N/A;   MASTECTOMY WITH AXILLARY LYMPH NODE DISSECTION  12/2003   "right" (03/20/2012)   PARATHYROIDECTOMY Left 10/11/2022   Procedure: MINIMALLY INVASIVE LEFT INFERIOR PARATHYROIDECTOMY;  Surgeon: Shela Derby, MD;  Location: [facility];  Service: General;  Laterality: Left;   POSTERIOR CERVICAL FUSION/FORAMINOTOMY  2001   SHOULDER ARTHROSCOPY W/ ROTATOR CUFF REPAIR  2003   Left   THYROIDECTOMY  1977   Right   TOTAL KNEE ARTHROPLASTY  2000   Right   US  of abdomen  07/2005   Fatty liver / kidney stones   Social History   Socioeconomic History   Marital status: Married    Spouse name: Not on file   Number of children: 1   Years of education: Not on file   Highest education level: Not on file  Occupational History   Occupation: Retired since 2003  Tobacco Use   Smoking status: Never   Smokeless tobacco: Never  Vaping Use   Vaping status: Never Used  Substance and Sexual Activity   Alcohol use: No   Drug use: No   Sexual activity: Not Currently  Other Topics Concern   Not on file  Social History Narrative   Not on file   Social Drivers of Health   Financial Resource Strain: Medium Risk (01/05/2023)   Overall Financial Resource Strain (CARDIA)    Difficulty of Paying Living Expenses: Somewhat hard  Food Insecurity: No Food Insecurity (01/05/2023)   Hunger Vital Sign     Worried About Running Out of Food in the Last Year: Never true    Ran Out of Food in the Last Year: Never true  Transportation Needs: No Transportation Needs (01/05/2023)   PRAPARE - Administrator, Civil Service (Medical): No    Lack of Transportation (Non-Medical): No  Physical Activity: Inactive (01/05/2023)   Exercise Vital Sign    Days of Exercise per Week: 0 days    Minutes of Exercise per Session: 0 min  Stress: No Stress Concern Present (01/05/2023)   [employer] of Occupational Health - Occupational Stress Questionnaire    Feeling of Stress : Not at all  Social Connections: Moderately Integrated (01/05/2023)   Social Connection and Isolation Panel [NHANES]    Frequency of Communication with Friends and Family: More than three times a week    Frequency of Social Gatherings with Friends and Family: Three times a week    Attends Religious Services: More than 4 times per year    Active Member of Clubs or Organizations: No    Attends Banker Meetings: Never    Marital Status: Married   Outpatient Encounter Medications as of 08/14/2023  Medication Sig   acetaminophen  (TYLENOL ) 500 MG tablet Take 1,000 mg by mouth every 6 (six) hours as needed for moderate pain, headache or mild pain.   Alcohol Swabs (B-D SINGLE USE SWABS REGULAR) PADS Use as directed.   aspirin  81 MG tablet Take 81 mg by mouth daily.   azelastine  (ASTELIN ) 0.1 % nasal spray Place 1 spray into both nostrils 2 (two) times daily. Use in each nostril as directed   Blood Glucose Monitoring Suppl (TRUE METRIX AIR GLUCOSE METER) w/Device KIT 1 kit by Does not apply route 3 (three) times daily.   cetirizine  (ZYRTEC ) 10 MG tablet TAKE 1 TABLET BY MOUTH EVERY DAY   Cholecalciferol  (VITAMIN D ) 50 MCG (2000 UT) tablet Take 2,000 Units by mouth daily.   COMIRNATY syringe    cyanocobalamin  2000 MCG tablet Take 2,000 mcg by mouth daily.   dapagliflozin  propanediol (FARXIGA ) 10 MG TABS tablet Take 10 mg by  mouth daily.   famotidine  (PEPCID ) 40 MG tablet TAKE 1 TABLET BY MOUTH EVERY DAY   gabapentin  (NEURONTIN ) 100 MG capsule Take 1 capsule (100 mg total) by mouth 3 (three) times daily.   glucose blood (TRUE METRIX BLOOD GLUCOSE TEST) test strip Use as instructed   hydrALAZINE  (APRESOLINE ) 50 MG tablet Take 1 tablet (  50 mg total) by mouth 3 (three) times daily.   insulin  NPH-regular Human (NOVOLIN  70/30) (70-30) 100 UNIT/ML injection Inject 25 Units into the skin 2 (two) times daily with a meal.   levothyroxine  (SYNTHROID ) 100 MCG tablet Take 1 tablet (100 mcg total) by mouth daily.   losartan  (COZAAR ) 25 MG tablet Take 1 tablet (25 mg total) by mouth daily.   metoCLOPramide  (REGLAN ) 10 MG tablet TAKE 1 TABLET FOUR TIMES DAILY   Omega-3 Fatty Acids (OMEGA 3 PO) Take 2 capsules by mouth daily. Omega XL supplement   pantoprazole  (PROTONIX ) 40 MG tablet Take 1 tablet (40 mg total) by mouth 2 (two) times daily.   Polyethyl Glycol-Propyl Glycol (SYSTANE ULTRA) 0.4-0.3 % SOLN Apply 1-2 drops to eye daily as needed (dry eyes).   polyethylene glycol (MIRALAX  / GLYCOLAX ) packet Take 17 g by mouth daily as needed for moderate constipation (MIX AND DRINK).   rosuvastatin  (CRESTOR ) 20 MG tablet TAKE 1 TABLET AT BEDTIME   SUMAtriptan  (IMITREX ) 50 MG tablet TAKE 1 TAB EVERY 2 HRS AS NEEDED FOR MIGRAINE. MAY REPEAT IN 2 HOURS IF HEADACHE PERSISTS OR RECURS. (Patient taking differently: Take 50 mg by mouth every 2 (two) hours as needed for migraine (May repeat in 2 hours if headache persists or recurs.).)   torsemide  (DEMADEX ) 20 MG tablet Take 1 tablet (20 mg total) by mouth 2 (two) times daily. (Patient taking differently: Take 20 mg by mouth 2 (two) times daily as needed (swelling).)   traMADol  (ULTRAM ) 50 MG tablet Take 1 tablet (50 mg total) by mouth every 6 (six) hours as needed (mild pain).   TRUEplus Lancets 33G MISC Use as directed   diazepam  (VALIUM ) 5 MG tablet Take 5 mg by mouth 2 (two) times daily.    magnesium  oxide (MAG-OX) 400 (240 Mg) MG tablet Take 1 tablet (400 mg total) by mouth 2 (two) times daily.   mirabegron  ER (MYRBETRIQ ) 25 MG TB24 tablet Take 1 tablet (25 mg total) by mouth daily.   No facility-administered encounter medications on file as of 08/14/2023.    ALLERGIES: Allergies  Allergen Reactions   Dilaudid  [Hydromorphone  Hcl] Nausea And Vomiting   Lipitor [Atorvastatin ] Other (See Comments)    Body & Muscle Aches   Adhesive [Tape] Rash   Ampicillin Rash and Other (See Comments)    Also developed welts on her hands when she took it in the hospital   Latex Itching, Dermatitis, Rash and Other (See Comments)    Patient had a reaction to tape after surgery and they told her she was allergic to Latex    Penicillins Rash         VACCINATION STATUS: Immunization History  Administered Date(s) Administered   Fluad Quad(high Dose 65+) 12/13/2019, 02/01/2021, 01/06/2022   Influenza Split 01/31/2011   Influenza Whole 02/23/2007   Influenza, High Dose Seasonal PF 01/10/2017, 01/02/2023   Influenza,inj,Quad PF,6+ Mos 01/07/2014, 02/09/2016, 01/01/2018, 12/03/2018   Influenza-Unspecified 01/10/2012   PFIZER Comirnaty(Gray Top)Covid-19 Tri-Sucrose Vaccine 05/24/2019, 06/18/2019, 01/11/2020, 08/15/2020   PFIZER(Purple Top)SARS-COV-2 Vaccination 05/24/2019, 06/18/2019, 01/11/2020   Pfizer(Comirnaty)Fall Seasonal Vaccine 12 years and older 01/02/2023   Pneumococcal Conjugate-13 05/16/2019   Pneumococcal Polysaccharide-23 08/25/2014   Tdap 04/20/2011, 07/20/2022    Diabetes She presents for her follow-up diabetic visit. She has type 2 diabetes mellitus. Onset time: She was diagnosed at approximate age of 38 years. Her disease course has been fluctuating. There are no hypoglycemic associated symptoms. Pertinent negatives for hypoglycemia include no confusion, headaches, pallor, seizures  or tremors. Associated symptoms include fatigue, polydipsia and polyuria. Pertinent negatives  for diabetes include no blurred vision, no chest pain, no foot ulcerations, no polyphagia and no weight loss. Pertinent negatives for hypoglycemia complications include no nocturnal hypoglycemia. Symptoms are improving. Diabetic complications include heart disease, nephropathy and retinopathy. Risk factors for coronary artery disease include diabetes mellitus, hypertension, obesity, sedentary lifestyle, post-menopausal and dyslipidemia. Current diabetic treatment includes insulin  injections and oral agent (monotherapy). She is compliant with treatment most of the time. Her weight is increasing steadily. She is following a generally unhealthy diet. When asked about meal planning, she reported none. She has not had a previous visit with a dietitian. She never participates in exercise. Her home blood glucose trend is decreasing steadily. Her breakfast blood glucose range is generally 180-200 mg/dl. Her lunch blood glucose range is generally 180-200 mg/dl. Her dinner blood glucose range is generally 180-200 mg/dl. Her bedtime blood glucose range is generally 180-200 mg/dl. Her overall blood glucose range is 180-200 mg/dl. (She presents today with her CGM showing mostly at target glycemic profile.  Her POCT A1c today is 8.7%, improving from last visit of 10.1%.  Analysis of her CGM shows TIR 33%, TAR 67%, TBR 0% with a GMI of 8.1%.  She denies any significant hypoglycemia.  She does report she only eats 1 meal per day around 5 pm.  She admits she has been taking her insulin  in the morning without eating.) An ACE inhibitor/angiotensin II receptor blocker is being taken. She does not see a podiatrist.Eye exam is current.  Hypertension This is a chronic problem. The current episode started more than 1 year ago. The problem has been gradually improving since onset. The problem is controlled. Pertinent negatives include no blurred vision, chest pain, headaches, palpitations or shortness of breath. Agents associated with  hypertension include thyroid  hormones. Risk factors for coronary artery disease include diabetes mellitus, obesity, sedentary lifestyle, dyslipidemia, family history and post-menopausal state. Past treatments include angiotensin blockers, calcium  channel blockers, beta blockers, diuretics and direct vasodilators. The current treatment provides mild improvement. There are no compliance problems.  Hypertensive end-organ damage includes kidney disease, CAD/MI and retinopathy. Identifiable causes of hypertension include chronic renal disease, hyperparathyroidism and a thyroid  problem. undergoing workup for hyperparathyroidism.  Thyroid  Problem Presents for follow-up visit. Symptoms include depressed mood and fatigue. Patient reports no cold intolerance, diarrhea, heat intolerance, palpitations, tremors, weight gain or weight loss. The symptoms have been stable.     Review of systems  Constitutional: + steadily increasing body weight,  current Body mass index is 33.64 kg/m. , no fatigue, no subjective hyperthermia, no subjective hypothermia Eyes: no blurry vision, no xerophthalmia ENT: no sore throat, no nodules palpated in throat, no dysphagia/odynophagia, no hoarseness Cardiovascular: no chest pain, no shortness of breath, no palpitations Respiratory: no cough, no shortness of breath Gastrointestinal: no nausea/vomiting/diarrhea Musculoskeletal: c/o chronic back pain- has spinal stimulator placed Skin: no rashes, no hyperemia Neurological: no tremors, + numbness/tingling, cold sensation to BLE, no dizziness Psychiatric: no depression, no anxiety    Objective:    BP 134/80 (BP Location: Left Arm, Patient Position: Sitting, Cuff Size: Large)   Pulse (!) 58   Ht 5\' 4"  (1.626 m)   Wt 196 lb (88.9 kg)   BMI 33.64 kg/m   Wt Readings from Last 3 Encounters:  08/14/23 196 lb (88.9 kg)  07/12/23 194 lb 4 oz (88.1 kg)  06/19/23 193 lb (87.5 kg)    BP Readings from Last 3 Encounters:  08/14/23  134/80  07/12/23 120/70  06/19/23 (!) 156/82      Physical Exam- Limited  Constitutional:  Body mass index is 33.64 kg/m. , not in acute distress, normal state of mind Eyes:  EOMI, no exophthalmos Musculoskeletal: no gross deformities, strength intact in all four extremities, no gross restriction of joint movements Skin:  no rashes, no hyperemia Neurological: no tremor with outstretched hands  Diabetic Foot Exam - Simple   No data filed         Latest Ref Rng & Units 07/17/2023    3:07 PM 02/06/2023    2:45 PM 10/12/2022    2:11 AM  CMP  Glucose 70 - 99 mg/dL  161  096   BUN 8 - 27 mg/dL  26  27   Creatinine 0.45 - 1.00 mg/dL  4.09  8.11   Sodium 914 - 144 mmol/L  141  135   Potassium 3.5 - 5.2 mmol/L  4.8  5.2   Chloride 96 - 106 mmol/L  105  106   CO2 20 - 29 mmol/L  18  20   Calcium  8.7 - 10.3 mg/dL  8.8  9.7   Total Protein 6.0 - 8.3 g/dL 7.1  7.2    Total Bilirubin 0.2 - 1.2 mg/dL 0.5  0.4    Alkaline Phos 39 - 117 U/L 53  56    AST 0 - 37 U/L 21  22    ALT 0 - 35 U/L 9  7       Diabetic Labs (most recent): Lab Results  Component Value Date   HGBA1C 8.7 (A) 08/14/2023   HGBA1C 10.1 (A) 05/12/2023   HGBA1C 8.3 (A) 02/08/2023   MICROALBUR 163.0 (H) 04/13/2023   MICROALBUR 570.4 08/05/2019   MICROALBUR 188.7 06/01/2018     Lipid Panel ( most recent) Lipid Panel     Component Value Date/Time   CHOL 141 07/17/2023 1507   CHOL 162 02/05/2021 0947   TRIG 241.0 (H) 07/17/2023 1507   HDL 41.40 07/17/2023 1507   HDL 51 02/05/2021 0947   CHOLHDL 3 07/17/2023 1507   VLDL 48.2 (H) 07/17/2023 1507   LDLCALC 52 07/17/2023 1507   LDLCALC 76 02/05/2021 0947   LDLCALC 60 06/02/2020 0816   LDLDIRECT 58.0 06/14/2022 1122      Assessment & Plan:   1) DM type 2 causing vascular disease (HCC)  - [name] has currently uncontrolled symptomatic type 2 DM since 73 years of age.  She presents today with her CGM showing mostly at target glycemic profile.   Her POCT A1c today is 8.7%, improving from last visit of 10.1%.  Analysis of her CGM shows TIR 33%, TAR 67%, TBR 0% with a GMI of 8.1%.  She denies any significant hypoglycemia.  She does report she only eats 1 meal per day around 5 pm.  She admits she has been taking her insulin  in the morning without eating.  -her diabetes is complicated by retinopathy, obesity/sedentary life, and she remains at a high risk for more acute and chronic complications which include CAD, CVA, CKD, retinopathy, and neuropathy. These are all discussed in detail with her.  The following Lifestyle Medicine recommendations according to American College of Lifestyle Medicine [facility]) were discussed and offered to patient and she agrees to start the journey:  A. Whole Foods, Plant-based plate comprising of fruits and vegetables, plant-based proteins, whole-grain carbohydrates was discussed in detail with the patient.   A list for source of  those nutrients were also provided to the patient.  Patient will use only water  or unsweetened tea for hydration. B.  The need to stay away from risky substances including alcohol, smoking; obtaining 7 to 9 hours of restorative sleep, at least 150 minutes of moderate intensity exercise weekly, the importance of healthy social connections,  and stress reduction techniques were discussed. C.  A full color page of  Calorie density of various food groups per pound showing examples of each food groups was provided to the patient.  - Nutritional counseling repeated at each appointment due to patients tendency to fall back in to old habits.  - The patient admits there is a room for improvement in their diet and drink choices. -  Suggestion is made for the patient to avoid simple carbohydrates from their diet including Cakes, Sweet Desserts / Pastries, Ice Cream, Soda (diet and regular), Sweet Tea, Candies, Chips, Cookies, Sweet Pastries, Store Bought Juices, Alcohol in Excess of 1-2 drinks a day,  Artificial Sweeteners, Coffee Creamer, and "Sugar-free" Products. This will help patient to have stable blood glucose profile and potentially avoid unintended weight gain.   - I encouraged the patient to switch to unprocessed or minimally processed complex starch and increased protein intake (animal or plant source), fruits, and vegetables.   - Patient is advised to stick to a routine mealtimes to eat 3 meals a day and avoid unnecessary snacks (to snack only to correct hypoglycemia).  - she is following with Penny Crumpton, RDN, CDE for individualized diabetes education.  - I have approached her with the following individualized plan to manage diabetes and patient agrees:   -She will continue to benefit from her current simplified insulin  treatment regimen.    -She is encouraged to stay engaged for proper monitoring and safe use of insulin  (which she has struggled with in the past).  -She is advised to continue Novolin  70/30 25 units SQ BID with meals if glucose is above 90 and she is eating.   She can continue her Farxiga  10 mg po daily as prescribed by nephrology- once approved by PAP.  I advised her not to take her insulin  if she skips a meal but we did talk in great length about the importance of eating breakfast.  -She is encouraged to start monitoring blood glucose consistently 4 times per day (using her CGM), before meals and before bed and to call the clinic if she has readings less than 70 or greater than 200 for 3 tests in a row.  - she is warned not to take insulin  without proper monitoring per orders.  -She is not a candidate for Metformin  due to CKD.    - Patient specific target  A1c;  LDL, HDL, Triglycerides, were discussed in detail.  2) BP/HTN:  Her blood pressure is controlled to target for her age.  She is advised to continue her medication regimen as prescribed by PCP and nephrology.    3) Lipids/HPL:    Her most recent lipid panel from 07/17/23 shows controlled LDL of 52  and elevated triglycerides of 241.  She is advised to continue Crestor  20 mg po daily.    4)  Weight/Diet:  Her Body mass index is 33.64 kg/m.- clearly complicating her diabetes care.  She is a candidate for modest weight loss.  I discussed with her the fact that loss of 5 - 10% of her  current body weight will have the most impact on her diabetes management.  CDE Consult will  be initiated . Exercise, and detailed carbohydrates information provided  -  detailed on discharge instructions.  5) Hypothyroidism-postsurgical Her previsit TFTs are consistent with appropriate hormone replacement (TSH was only lab checked).  She is advised to continue Levothyroxine  100 mcg po daily before breakfast.     - We discussed about the correct intake of her thyroid  hormone, on empty stomach at fasting, with water , separated by at least 30 minutes from breakfast and other medications,  and separated by more than 4 hours from calcium , iron , multivitamins, acid reflux medications (PPIs). -Patient is made aware of the fact that thyroid  hormone replacement is needed for life, dose to be adjusted by periodic monitoring of thyroid  function tests.  6) Multinodular goiter. -Her most recent thyroid /neck ultrasound from 09/12/18 revealed 2.9 cm nodule on the left lobe of her thyroid  -She has surgically absent right thyroid  lobe.  Biopsy of 2.9 cm nodule on the left lobe is benign.    She will not need any surgical intervention at this time.    7) Vitamin D  deficiency -Her most recent vitamin D  level on 06/02/20 was 15.  She was taken off her vitamin D  supplement by her nephrologist. Will recommend she follow up with her nephrologist regarding low vitamin D  levels.  8) Chronic Care/Health Maintenance: -she is on ARB and is encouraged to initiate and continue to follow up with Ophthalmology, Dentist,  Podiatrist at least yearly or according to recommendations, and advised to  stay away from smoking. I have recommended yearly  flu vaccine and pneumonia vaccine at least every 5 years; moderate intensity exercise for up to 150 minutes weekly; and  sleep for at least 7 hours a day.  - I advised patient to maintain close follow up with Tabori, Katherine E, MD for primary care needs.     I spent  38  minutes in the care of the patient today including review of labs from CMP, Lipids, Thyroid  Function, Hematology (current and previous including abstractions from other facilities); face-to-face time discussing  her blood glucose readings/logs, discussing hypoglycemia and hyperglycemia episodes and symptoms, medications doses, her options of short and long term treatment based on the latest standards of care / guidelines;  discussion about incorporating lifestyle medicine;  and documenting the encounter. Risk reduction counseling performed per USPSTF guidelines to reduce obesity and cardiovascular risk factors.     Please refer to Patient Instructions for Blood Glucose Monitoring and Insulin /Medications Dosing Guide"  in media tab for additional information. Please  also refer to " Patient Self Inventory" in the Media  tab for reviewed elements of pertinent patient history.  Bertell Broach participated in the discussions, expressed understanding, and voiced agreement with the above plans.  All questions were answered to her satisfaction. she is encouraged to contact clinic should she have any questions or concerns prior to her return visit.   Follow up plan: - Return in about 4 months (around 12/15/2023) for Diabetes F/U with A1c in office, No previsit labs, Bring meter and logs.  Hulon Magic, [facility] [address] [place], [place] [address] Phone: [phone] Fax: [phone]  08/14/2023, 11:33 AM

## 2023-08-15 ENCOUNTER — Encounter (HOSPITAL_BASED_OUTPATIENT_CLINIC_OR_DEPARTMENT_OTHER): Payer: Self-pay | Admitting: Physical Therapy

## 2023-08-15 ENCOUNTER — Ambulatory Visit (HOSPITAL_BASED_OUTPATIENT_CLINIC_OR_DEPARTMENT_OTHER): Admitting: Physical Therapy

## 2023-08-15 DIAGNOSIS — M5459 Other low back pain: Secondary | ICD-10-CM

## 2023-08-15 DIAGNOSIS — R2689 Other abnormalities of gait and mobility: Secondary | ICD-10-CM | POA: Diagnosis not present

## 2023-08-15 DIAGNOSIS — M6281 Muscle weakness (generalized): Secondary | ICD-10-CM | POA: Diagnosis not present

## 2023-08-15 NOTE — Therapy (Signed)
 OUTPATIENT PHYSICAL THERAPY THORACOLUMBAR TREATMENT   Patient Name: [name] MRN: [id number] DOB:07-19-1950, 73 y.o., female Today's Date: 08/15/2023  END OF SESSION:  PT End of Session - 08/15/23 1239     Visit Number 7    Number of Visits 16    Date for PT Re-Evaluation 09/08/23    Authorization Type [employer]IC[employer]    Progress Note Due on Visit 10    PT Start Time 1233    PT Stop Time 1311    PT Time Calculation (min) 38 min    Behavior During Therapy [facility] for tasks assessed/performed                Past Medical History:  Diagnosis Date   Anginal pain (HCC) 03/19/2012   saw Dr. Floria Hurst .. she thinks its more related to stomach issues   Arthritis    "all over; mostly in my back" (03/20/2012)   Breast cancer (HCC)    "right" (03/20/2012)   Cardiomyopathy    Chest pain 06/2005   Hospitalized, dystolic dysfunction   Chronic lower back pain    "have 2 herniated discs; going to have to have a fusion" (03/20/2012)   CKD (chronic kidney disease), stage III (HCC)    Family history of anesthesia complication    "father has nausea and vomiting"   Gallstones    GERD (gastroesophageal reflux disease)    H/O hiatal hernia    History of kidney stones    History of right mastectomy    Hypertension    "patient states never had HTN, takes Hyzaar for heart   Hypothyroidism    Irritable bowel syndrome    Migraines    "it's been a long time since I've had one" (03/20/2012)   Osteoarthritis    Peripheral neuropathy    PONV (postoperative nausea and vomiting)    Sciatica    Sinus bradycardia    Sleep apnea    mild, no CPAP   Type II diabetes mellitus (HCC)    Past Surgical History:  Procedure Laterality Date   ABDOMINAL HYSTERECTOMY  1993   adenosin cardiolite   07/2005   (+) wall motion abnormality   AXILLARY LYMPH NODE DISSECTION  2003   squamous cell cancer   BACK SURGERY     CARDIAC CATHETERIZATION  ? 2005 / 2007   CHOLECYSTECTOMY N/A 01/24/2018    Procedure: LAPAROSCOPIC CHOLECYSTECTOMY;  Surgeon: Sim Dryer, MD;  Location: [facility] OR;  Service: General;  Laterality: N/A;   CT abdomen and pelvis  02/2010   Same   ESOPHAGOGASTRODUODENOSCOPY  2005   GERD   KNEE ARTHROSCOPY  1990's   "right" (03/20/2012)   LUMBAR FUSION N/A 08/22/2014   Procedure: Right L2-3 and right L1-2 transforaminal lumbar interbody fusion with pedicle screws, rods, sleeves, cages, local bone graft, Vivigen, cancellous chips;  Surgeon: Alphonso Jean, MD;  Location: [facility] OR;  Service: Orthopedics;  Laterality: N/A;   LUMBAR LAMINECTOMY/DECOMPRESSION MICRODISCECTOMY N/A 02/04/2014   Procedure: RIGHT L2-3 MICRODISCECTOMY ;  Surgeon: Alphonso Jean, MD;  Location: [facility] OR;  Service: Orthopedics;  Laterality: N/A;   MASTECTOMY WITH AXILLARY LYMPH NODE DISSECTION  12/2003   "right" (03/20/2012)   PARATHYROIDECTOMY Left 10/11/2022   Procedure: MINIMALLY INVASIVE LEFT INFERIOR PARATHYROIDECTOMY;  Surgeon: Shela Derby, MD;  Location: [facility] OR;  Service: General;  Laterality: Left;   POSTERIOR CERVICAL FUSION/FORAMINOTOMY  2001   SHOULDER ARTHROSCOPY W/ ROTATOR CUFF REPAIR  2003   Left   THYROIDECTOMY  1977   Right  TOTAL KNEE ARTHROPLASTY  2000   Right   US  of abdomen  07/2005   Fatty liver / kidney stones   Patient Active Problem List   Diagnosis Date Noted   Urinary incontinence 08/09/2023   Metallic taste 05/11/2023   S/P parathyroidectomy 10/11/2022   Allergic rhinitis 07/25/2022   Syncope 07/21/2022   Sinus bradycardia 07/21/2022   Hammer toe of second toe of left foot 05/05/2021   Corns and callosities 05/05/2021   Hyperlipidemia associated with type 2 diabetes mellitus (HCC) 02/01/2021   Depression 02/01/2021   Localized osteoporosis without current pathological fracture 05/01/2020   Hyperparathyroidism (HCC) 12/13/2019   Elevated hemoglobin (HCC) 12/13/2019   Chronic renal impairment, stage 4 (severe) (HCC) 10/28/2019   Anemia 10/28/2019   Physical exam  06/26/2019   Neck pain 04/18/2019   Ascending aortic aneurysm (HCC) 04/17/2019   Nonspecific chest pain 04/15/2019   Neuropathic pain of right flank 12/27/2018   Dysphagia 12/27/2018   Postsurgical hypothyroidism 06/11/2018   Vitamin D  deficiency 06/11/2018   Multinodular goiter 06/11/2018   DM type 2 causing vascular disease (HCC)    Peripheral neuropathy    Migraine    Irritable bowel syndrome    Essential hypertension    History of right mastectomy    H/O hiatal hernia    GERD (gastroesophageal reflux disease)    Family history of anesthesia complication    Other chronic pain    Type 2 diabetes mellitus with diabetic chronic kidney disease (HCC)    Lower extremity edema    Chronic constipation 06/23/2015   Chronic diastolic heart failure (HCC) 11/17/2014   Herniated nucleus pulposus, L2-3 right 02/04/2014    Class: Acute   Spondylolisthesis of lumbar region 04/30/2012    Class: Chronic   Lumbar stenosis with neurogenic claudication 04/30/2012    Class: Chronic   SYNCOPE-CAROTID SINUS 09/16/2008   OSTEOARTHRITIS 08/23/2006   DDD (degenerative disc disease), cervical 08/23/2006   DISC DISEASE, LUMBAR 08/23/2006   Headache(784.0) 08/23/2006   BREAST CANCER, HX OF 08/23/2006   RENAL CALCULUS, HX OF 08/23/2006    PCP: Jess Morita, MD   REFERRING PROVIDER: Diedra Fowler, MD  REFERRING DIAG: M54.50 (ICD-10-CM) - Low back pain, unspecified back pain laterality, unspecified chronicity, unspecified whether sciatica present  Rationale for Evaluation and Treatment: Rehabilitation  THERAPY DIAG:  Muscle weakness (generalized)  Other abnormalities of gait and mobility  Other low back pain  ONSET DATE: chronic   SUBJECTIVE:  SUBJECTIVE STATEMENT: Pt reports increased pain in  lower abdomen and in lower back.  "I'll work through it. I don't want to go backwards".   PERTINENT HISTORY:  Chronic LBP, hx Lumbar surgery, CKD, hx breast cancer, DM, neuropathy, osteoporosis, does have spinal cord stimulator  PAIN:  Are you having pain? Yes: NPRS scale: 6/10 Pain location: low back, abdomen  Pain description: achy Aggravating factors: walking/standing Relieving factors: laying down  PRECAUTIONS: None  WEIGHT BEARING RESTRICTIONS: No  FALLS:  Has patient fallen in last 6 months? No  PLOF: Independent  PATIENT GOALS: stand up and cook   OBJECTIVE: (objective measures from initial evaluation unless otherwise dated)  PATIENT SURVEYS: Modified Oswestry 30/50   SCREENING FOR RED FLAGS: Bowel or bladder incontinence: No Spinal tumors: No Cauda equina syndrome: No Compression fracture: No Abdominal aneurysm: No  COGNITION: Overall cognitive status: Within functional limits for tasks assessed     SENSATION: WFL  POSTURE: rounded shoulders, forward head, decreased lumbar lordosis, increased thoracic kyphosis, and flexed trunk   PALPATION: TTP lumbar and thoracic paraspinals with greatest tenderness throughout lumbar spine.  LUMBAR ROM:   AROM eval  Flexion 25% limited  Extension 90% limited *  Right lateral flexion 75% limited *  Left lateral flexion 75% limited*  Right rotation 50% limited*  Left rotation 50% limited*   (Blank rows = not tested) * = pain/symptoms  LOWER EXTREMITY ROM:     Active  Right eval Left eval  Hip flexion    Hip extension    Hip abduction    Hip adduction    Hip internal rotation    Hip external rotation    Knee flexion    Knee extension    Ankle dorsiflexion    Ankle plantarflexion    Ankle inversion    Ankle eversion     (Blank rows = not tested) * = pain/symptoms  LOWER EXTREMITY MMT:  unable to test extension as patient unable to get into prone  MMT Right eval Left eval  Hip flexion 4+ 4+  Hip  extension    Hip abduction 4- 3+  Hip adduction    Hip internal rotation    Hip external rotation    Knee flexion 4+ 4+  Knee extension 4+ 4+  Ankle dorsiflexion 4+ 4+  Ankle plantarflexion    Ankle inversion    Ankle eversion     (Blank rows = not tested) * = pain/symptoms    FUNCTIONAL TESTS:  5 times sit to stand: 27.59 seconds with UE use 08/08/23:  22.5s with UE use  GAIT: Distance walked: 100 feet Assistive device utilized: Single point cane Level of assistance: Modified independence Comments: labored, decreased foot clearance  TODAY'S TREATMENT:                                                                                                                              DATE:  [facility] Adult PT Treatment:  DATE: 08/15/23 NuStep LE/UE, L4: 6 min At  counter:  Side stepping x 2laps  Heel/toe raises x 15 Trunk ext to neutral Backward gait / forward march with tactile cues for more vertical trunk; "L" stretch at sink x 2; return to backward gait/ forward marching x 2 lap Bilat arm overhead reach with ball above eye level x10, x10each diagonal Leaning on counter, wag the tail Hip abdct/ addct x 10 each LE  Forward 4" step ups x 5 each LE  [facility] Adult PT Treatment:                                                DATE: 08/11/23 NuStep LE/UE, L5: 6 min   Standing shoulder ext with red band x 10;  bilat shoulder row with red band x 10  Seated ball roll stretch on table for lumbar flexion 5" x10 STS with forward arm reach x 10  (slightly elevated plinth) At rail:  Side stepping x2laps  Single arm overhead reach with ball above eye level x10each arm straight up, x10each diagonal Standing march 2x10 HR/TR x20 (with core activation) Forward step ups 4" with opp arm on rail (x 10 LLE, x 10 RLE) with cues for core engagement Seated Shoulder rolls x15 Seated 2# in/out from chest x 15 Seated marching 2x10ea Seated LAQ 2x10ea Gait in hall  with SPC x1 lap   [facility] Adult PT Treatment:                                                DATE: 08/08/23 NuStep LE, L5: 6 min  (~45-60 SPM) Standing shoulder ext with red band x 10;  bilat shoulder row with red band x 10  "L " stretch at sink x 2; trunk ext to neutral STS (timed) then STS with forward arm reach x 10 total At counter:  Side stepping R/L 28ft x 3 Single arm overhead reach with ball shelf above eye level x 5-7 each arm High knee Marching Forward step ups 4" with opp arm on counter (x 8 LLE, x 5 RLE) with cues for core engagement Seated Shoulder rolls Seated 2# in/out from chest x 10  [facility] Adult PT Treatment:                                                DATE: 08/04/23 Therapeutic Exercise: NuStep LE, L4: 6 min  (~45-60 SPM) Bilat-> single arm slide up cabinet x 5 each "L " stretch at sink x 5 At counter:  Marching Hip abdct x 10  Hip ext to toe touch x 10 Seated row with red band 3 x 10  Seated w's x 3-5 sec x 3  Shoulder rolls Seated ball in/out from chest Standing trunk ext at counter (ext to neutral, from forward flexed posture) x 5sec x 3 Therapeutic Activity: STS with forward arm reach x 10 with cues for core engagement (throughout session, not all at once) Side stepping at small counter R/L with UE support on counter, with cues for neutral foot, upright trunk and head  [facility] Adult PT  Treatment:                                                DATE: 08/02/23  Therapeutic Exercise: NuStep LE, L5: 5 min  (~55 SPM) Seated trunk flexion stretch -> pball roll outs forward x 10 Seated row with red band 3 x 10  At counter: side stepping R/L with cues for neutral foot, upright trunk and head Seated Lap press with TrA set x 10 sec x 5 reps  Seated w's x 3-5 sec x 8 Therapeutic Activity: STS with forward arm reach x 10 with cues for core engagement Side stepping at small counter R/L with UE support on counter   [facility] Adult PT Treatment:                                                 DATE: 07/20/23  Therapeutic Exercise: NuStep LE, L4: 5 min  (~40 SPM) At counter: trunk ext to neutral -> side stepping R/L with cues for neutral foot Seated trunk flexion stretch -> pball roll outs forward x 10 Seated Lap press with TrA set x 5 sec x 8 reps  Therapeutic Activity: Sit to/from sidlying x 2 reps (unable to roll to back) STS with use of UE on chair x 5 (spread out throughout session) with cues for core engagement Side stepping at small counter R/L with UE support on counter Self Care: Educated on ice/heat application parameters Encouraged pt to get up from chair 1x/hour    PATIENT EDUCATION:  Education details: exercise rationale, progression/modification  Person educated: Patient Education method: Programmer, multimedia, Facilities manager, Education comprehension: verbalized understanding, returned demonstration, verbal cues required, and tactile cues required  HOME EXERCISE PROGRAM: Access Code: [facility] URL: [email or web address]   ASSESSMENT:  CLINICAL IMPRESSION: Good tolerance for standing exercises, though pt requires occasional short seated rest break. Tactile cuing for more upright trunk needed when standing.  Pain remained unchanged during session.  Pt is progressing gradually towards remaining goals.    OBJECTIVE IMPAIRMENTS: Abnormal gait, decreased activity tolerance, decreased balance, decreased endurance, decreased mobility, difficulty walking, decreased ROM, decreased strength, increased muscle spasms, impaired flexibility, impaired sensation, improper body mechanics, postural dysfunction, and pain.   ACTIVITY LIMITATIONS: carrying, lifting, bending, sitting, standing, squatting, stairs, transfers, reach over head, hygiene/grooming, locomotion level, and caring for others  PARTICIPATION LIMITATIONS: meal prep, cleaning, laundry, shopping, community activity, and yard work  PERSONAL FACTORS: Fitness, Time since onset of  injury/illness/exacerbation, and 3+ comorbidities: Chronic LBP, hx Lumbar surgery, CKD, hx breast cancer, DM, neuropathy, osteoporosis   are also affecting patient's functional outcome.   REHAB POTENTIAL: Good  CLINICAL DECISION MAKING: Evolving/moderate complexity  EVALUATION COMPLEXITY: Moderate   GOALS: Goals reviewed with patient? Yes  SHORT TERM GOALS: Target date: 08/11/2023    Patient will be independent with HEP in order to improve functional outcomes. Baseline:  Goal status: IN PROGRESS (performs them "a little bit.") 08/11/23  2.  Patient will report at least 25% improvement in symptoms for improved quality of life. Baseline:  Goal status: MET 08/11/23   LONG TERM GOALS: Target date: 09/08/2023    Patient will report at least 75% improvement in symptoms for improved quality of life. Baseline:  Goal status: INITIAL  2.  Patient will improve modified oswestry score by at least 8 points in order to indicate improved tolerance to activity. Baseline:  Goal status: INITIAL  3.  Patient will demonstrate at least 25% improvement in lumbar ROM in all restricted planes for improved ability to move trunk while completing chores and cooking. Baseline:  Goal status: INITIAL  4.  Patient will be able to complete 5x STS in under 15 seconds in order to demonstrate improved functional mobility/strength Baseline: see above Goal status: In progress - 08/08/23  5.  Patient will demonstrate grade of 5/5 MMT grade in all tested musculature as evidence of improved strength to assist with stair ambulation and gait.   Baseline:  Goal status: INITIAL     PLAN:  PT FREQUENCY: 2x/week  PT DURATION: 8 weeks  PLANNED INTERVENTIONS: 97164- PT Re-evaluation, 97110-Therapeutic exercises, 97530- Therapeutic activity, 97112- Neuromuscular re-education, 97535- Self Care, 16109- Manual therapy, [phone]- Gait training, [phone]- Orthotic Fit/training, [phone]- Canalith repositioning, [id number]- Aquatic  Therapy, [phone]- Splinting, Patient/Family education, Balance training, Stair training, Taping, Dry Needling, Joint mobilization, Joint manipulation, Spinal manipulation, Spinal mobilization, Scar mobilization, and DME instructions.  PLAN FOR NEXT SESSION: f/u with HEP, spinal mobility, core and glute and LE strength, functional strength  Almedia Jacobsen, PTA 08/15/23 3:20 PM [facility] [address] [place], [place], [address] Phone: [phone]   Fax:  [phone]     Referring diagnosis? M54.50 (ICD-10-CM) - Low back pain, unspecified back pain laterality, unspecified chronicity, unspecified whether sciatica present Treatment diagnosis? (if different than referring diagnosis) m54.59 What was this (referring dx) caused by? []  Surgery []  Fall [x]  Ongoing issue []  Arthritis []  Other: ____________  Laterality: []  Rt []  Lt [x]  Both  Check all possible CPT codes:  *CHOOSE 10 OR LESS*    See Planned Interventions listed in the Plan section of the Evaluation.

## 2023-08-18 ENCOUNTER — Encounter (HOSPITAL_BASED_OUTPATIENT_CLINIC_OR_DEPARTMENT_OTHER): Payer: Self-pay | Admitting: Physical Therapy

## 2023-08-18 ENCOUNTER — Ambulatory Visit (HOSPITAL_BASED_OUTPATIENT_CLINIC_OR_DEPARTMENT_OTHER): Admitting: Physical Therapy

## 2023-08-18 DIAGNOSIS — M6281 Muscle weakness (generalized): Secondary | ICD-10-CM | POA: Diagnosis not present

## 2023-08-18 DIAGNOSIS — M5459 Other low back pain: Secondary | ICD-10-CM

## 2023-08-18 DIAGNOSIS — R2689 Other abnormalities of gait and mobility: Secondary | ICD-10-CM | POA: Diagnosis not present

## 2023-08-18 NOTE — Therapy (Signed)
 OUTPATIENT PHYSICAL THERAPY THORACOLUMBAR TREATMENT   Patient Name: [name] MRN: [id number] DOB:06-Mar-1951, 73 y.o., female Today's Date: 08/18/2023  END OF SESSION:  PT End of Session - 08/18/23 1103     Visit Number 8    Number of Visits 16    Date for PT Re-Evaluation 09/08/23    Authorization Type [employer]IC[employer]    Progress Note Due on Visit 10    PT Start Time 1103    PT Stop Time 1143    PT Time Calculation (min) 40 min    Behavior During Therapy [facility] for tasks assessed/performed                Past Medical History:  Diagnosis Date   Anginal pain (HCC) 03/19/2012   saw Dr. Floria Hurst .. she thinks its more related to stomach issues   Arthritis    "all over; mostly in my back" (03/20/2012)   Breast cancer (HCC)    "right" (03/20/2012)   Cardiomyopathy    Chest pain 06/2005   Hospitalized, dystolic dysfunction   Chronic lower back pain    "have 2 herniated discs; going to have to have a fusion" (03/20/2012)   CKD (chronic kidney disease), stage III (HCC)    Family history of anesthesia complication    "father has nausea and vomiting"   Gallstones    GERD (gastroesophageal reflux disease)    H/O hiatal hernia    History of kidney stones    History of right mastectomy    Hypertension    "patient states never had HTN, takes Hyzaar for heart   Hypothyroidism    Irritable bowel syndrome    Migraines    "it's been a long time since I've had one" (03/20/2012)   Osteoarthritis    Peripheral neuropathy    PONV (postoperative nausea and vomiting)    Sciatica    Sinus bradycardia    Sleep apnea    mild, no CPAP   Type II diabetes mellitus (HCC)    Past Surgical History:  Procedure Laterality Date   ABDOMINAL HYSTERECTOMY  1993   adenosin cardiolite   07/2005   (+) wall motion abnormality   AXILLARY LYMPH NODE DISSECTION  2003   squamous cell cancer   BACK SURGERY     CARDIAC CATHETERIZATION  ? 2005 / 2007   CHOLECYSTECTOMY N/A 01/24/2018    Procedure: LAPAROSCOPIC CHOLECYSTECTOMY;  Surgeon: Sim Dryer, MD;  Location: [facility] OR;  Service: General;  Laterality: N/A;   CT abdomen and pelvis  02/2010   Same   ESOPHAGOGASTRODUODENOSCOPY  2005   GERD   KNEE ARTHROSCOPY  1990's   "right" (03/20/2012)   LUMBAR FUSION N/A 08/22/2014   Procedure: Right L2-3 and right L1-2 transforaminal lumbar interbody fusion with pedicle screws, rods, sleeves, cages, local bone graft, Vivigen, cancellous chips;  Surgeon: Alphonso Jean, MD;  Location: [facility] OR;  Service: Orthopedics;  Laterality: N/A;   LUMBAR LAMINECTOMY/DECOMPRESSION MICRODISCECTOMY N/A 02/04/2014   Procedure: RIGHT L2-3 MICRODISCECTOMY ;  Surgeon: Alphonso Jean, MD;  Location: [facility] OR;  Service: Orthopedics;  Laterality: N/A;   MASTECTOMY WITH AXILLARY LYMPH NODE DISSECTION  12/2003   "right" (03/20/2012)   PARATHYROIDECTOMY Left 10/11/2022   Procedure: MINIMALLY INVASIVE LEFT INFERIOR PARATHYROIDECTOMY;  Surgeon: Shela Derby, MD;  Location: [facility] OR;  Service: General;  Laterality: Left;   POSTERIOR CERVICAL FUSION/FORAMINOTOMY  2001   SHOULDER ARTHROSCOPY W/ ROTATOR CUFF REPAIR  2003   Left   THYROIDECTOMY  1977   Right  TOTAL KNEE ARTHROPLASTY  2000   Right   US  of abdomen  07/2005   Fatty liver / kidney stones   Patient Active Problem List   Diagnosis Date Noted   Urinary incontinence 08/09/2023   Metallic taste 05/11/2023   S/P parathyroidectomy 10/11/2022   Allergic rhinitis 07/25/2022   Syncope 07/21/2022   Sinus bradycardia 07/21/2022   Hammer toe of second toe of left foot 05/05/2021   Corns and callosities 05/05/2021   Hyperlipidemia associated with type 2 diabetes mellitus (HCC) 02/01/2021   Depression 02/01/2021   Localized osteoporosis without current pathological fracture 05/01/2020   Hyperparathyroidism (HCC) 12/13/2019   Elevated hemoglobin (HCC) 12/13/2019   Chronic renal impairment, stage 4 (severe) (HCC) 10/28/2019   Anemia 10/28/2019   Physical exam  06/26/2019   Neck pain 04/18/2019   Ascending aortic aneurysm (HCC) 04/17/2019   Nonspecific chest pain 04/15/2019   Neuropathic pain of right flank 12/27/2018   Dysphagia 12/27/2018   Postsurgical hypothyroidism 06/11/2018   Vitamin D  deficiency 06/11/2018   Multinodular goiter 06/11/2018   DM type 2 causing vascular disease (HCC)    Peripheral neuropathy    Migraine    Irritable bowel syndrome    Essential hypertension    History of right mastectomy    H/O hiatal hernia    GERD (gastroesophageal reflux disease)    Family history of anesthesia complication    Other chronic pain    Type 2 diabetes mellitus with diabetic chronic kidney disease (HCC)    Lower extremity edema    Chronic constipation 06/23/2015   Chronic diastolic heart failure (HCC) 11/17/2014   Herniated nucleus pulposus, L2-3 right 02/04/2014    Class: Acute   Spondylolisthesis of lumbar region 04/30/2012    Class: Chronic   Lumbar stenosis with neurogenic claudication 04/30/2012    Class: Chronic   SYNCOPE-CAROTID SINUS 09/16/2008   OSTEOARTHRITIS 08/23/2006   DDD (degenerative disc disease), cervical 08/23/2006   DISC DISEASE, LUMBAR 08/23/2006   Headache(784.0) 08/23/2006   BREAST CANCER, HX OF 08/23/2006   RENAL CALCULUS, HX OF 08/23/2006    PCP: Jess Morita, MD   REFERRING PROVIDER: Diedra Fowler, MD  REFERRING DIAG: M54.50 (ICD-10-CM) - Low back pain, unspecified back pain laterality, unspecified chronicity, unspecified whether sciatica present  Rationale for Evaluation and Treatment: Rehabilitation  THERAPY DIAG:  Muscle weakness (generalized)  Other abnormalities of gait and mobility  Other low back pain  ONSET DATE: chronic   SUBJECTIVE:  SUBJECTIVE STATEMENT: Pt reports she feels pt has  been helping. One spot still hurts.  PERTINENT HISTORY:  Chronic LBP, hx Lumbar surgery, CKD, hx breast cancer, DM, neuropathy, osteoporosis, does have spinal cord stimulator  PAIN:  Are you having pain? Yes: NPRS scale: 6/10 Pain location: low back, abdomen  Pain description: achy Aggravating factors: walking/standing Relieving factors: laying down  PRECAUTIONS: None  WEIGHT BEARING RESTRICTIONS: No  FALLS:  Has patient fallen in last 6 months? No  PLOF: Independent  PATIENT GOALS: stand up and cook   OBJECTIVE: (objective measures from initial evaluation unless otherwise dated)  PATIENT SURVEYS: Modified Oswestry 30/50   SCREENING FOR RED FLAGS: Bowel or bladder incontinence: No Spinal tumors: No Cauda equina syndrome: No Compression fracture: No Abdominal aneurysm: No  COGNITION: Overall cognitive status: Within functional limits for tasks assessed     SENSATION: WFL  POSTURE: rounded shoulders, forward head, decreased lumbar lordosis, increased thoracic kyphosis, and flexed trunk   PALPATION: TTP lumbar and thoracic paraspinals with greatest tenderness throughout lumbar spine.  LUMBAR ROM:   AROM eval  Flexion 25% limited  Extension 90% limited *  Right lateral flexion 75% limited *  Left lateral flexion 75% limited*  Right rotation 50% limited*  Left rotation 50% limited*   (Blank rows = not tested) * = pain/symptoms  LOWER EXTREMITY ROM:     Active  Right eval Left eval  Hip flexion    Hip extension    Hip abduction    Hip adduction    Hip internal rotation    Hip external rotation    Knee flexion    Knee extension    Ankle dorsiflexion    Ankle plantarflexion    Ankle inversion    Ankle eversion     (Blank rows = not tested) * = pain/symptoms  LOWER EXTREMITY MMT:  unable to test extension as patient unable to get into prone  MMT Right eval Left eval  Hip flexion 4+ 4+  Hip extension    Hip abduction 4- 3+  Hip adduction     Hip internal rotation    Hip external rotation    Knee flexion 4+ 4+  Knee extension 4+ 4+  Ankle dorsiflexion 4+ 4+  Ankle plantarflexion    Ankle inversion    Ankle eversion     (Blank rows = not tested) * = pain/symptoms    FUNCTIONAL TESTS:  5 times sit to stand: 27.59 seconds with UE use 08/08/23:  22.5s with UE use  GAIT: Distance walked: 100 feet Assistive device utilized: Single point cane Level of assistance: Modified independence Comments: labored, decreased foot clearance  TODAY'S TREATMENT:                                                                                                                              DATE:  08/18/23 Self STM with tennis ball to lower lumbar paraspinals Standing hip abduction RTB at knees 3  x 10 Standing hip extension RTB at knees 2 x 10 Seated trunk flexion stretch 3 way stretch 5 x 5 second holds each Step up 4 inch 1 x 10 Lateral step up 4 inch 1 x 10  [facility] Adult PT Treatment:                                                DATE: 08/15/23 NuStep LE/UE, L4: 6 min At  counter:  Side stepping x 2laps  Heel/toe raises x 15 Trunk ext to neutral Backward gait / forward march with tactile cues for more vertical trunk; "L" stretch at sink x 2; return to backward gait/ forward marching x 2 lap Bilat arm overhead reach with ball above eye level x10, x10each diagonal Leaning on counter, wag the tail Hip abdct/ addct x 10 each LE  Forward 4" step ups x 5 each LE  [facility] Adult PT Treatment:                                                DATE: 08/11/23 NuStep LE/UE, L5: 6 min   Standing shoulder ext with red band x 10;  bilat shoulder row with red band x 10  Seated ball roll stretch on table for lumbar flexion 5" x10 STS with forward arm reach x 10  (slightly elevated plinth) At rail:  Side stepping x2laps  Single arm overhead reach with ball above eye level x10each arm straight up, x10each diagonal Standing march 2x10 HR/TR x20 (with core  activation) Forward step ups 4" with opp arm on rail (x 10 LLE, x 10 RLE) with cues for core engagement Seated Shoulder rolls x15 Seated 2# in/out from chest x 15 Seated marching 2x10ea Seated LAQ 2x10ea Gait in hall with SPC x1 lap   [facility] Adult PT Treatment:                                                DATE: 08/08/23 NuStep LE, L5: 6 min  (~45-60 SPM) Standing shoulder ext with red band x 10;  bilat shoulder row with red band x 10  "L " stretch at sink x 2; trunk ext to neutral STS (timed) then STS with forward arm reach x 10 total At counter:  Side stepping R/L 59ft x 3 Single arm overhead reach with ball shelf above eye level x 5-7 each arm High knee Marching Forward step ups 4" with opp arm on counter (x 8 LLE, x 5 RLE) with cues for core engagement Seated Shoulder rolls Seated 2# in/out from chest x 10  [facility] Adult PT Treatment:                                                DATE: 08/04/23 Therapeutic Exercise: NuStep LE, L4: 6 min  (~45-60 SPM) Bilat-> single arm slide up cabinet x 5 each "L " stretch at sink x 5 At counter:  Marching Hip abdct x 10  Hip ext to toe touch x 10 Seated row with red band 3 x 10  Seated w's x 3-5 sec x 3  Shoulder rolls Seated ball in/out from chest Standing trunk ext at counter (ext to neutral, from forward flexed posture) x 5sec x 3 Therapeutic Activity: STS with forward arm reach x 10 with cues for core engagement (throughout session, not all at once) Side stepping at small counter R/L with UE support on counter, with cues for neutral foot, upright trunk and head  [facility] Adult PT Treatment:                                                DATE: 08/02/23  Therapeutic Exercise: NuStep LE, L5: 5 min  (~55 SPM) Seated trunk flexion stretch -> pball roll outs forward x 10 Seated row with red band 3 x 10  At counter: side stepping R/L with cues for neutral foot, upright trunk and head Seated Lap press with TrA set x 10 sec x 5 reps  Seated w's  x 3-5 sec x 8 Therapeutic Activity: STS with forward arm reach x 10 with cues for core engagement Side stepping at small counter R/L with UE support on counter   [facility] Adult PT Treatment:                                                DATE: 07/20/23  Therapeutic Exercise: NuStep LE, L4: 5 min  (~40 SPM) At counter: trunk ext to neutral -> side stepping R/L with cues for neutral foot Seated trunk flexion stretch -> pball roll outs forward x 10 Seated Lap press with TrA set x 5 sec x 8 reps  Therapeutic Activity: Sit to/from sidlying x 2 reps (unable to roll to back) STS with use of UE on chair x 5 (spread out throughout session) with cues for core engagement Side stepping at small counter R/L with UE support on counter Self Care: Educated on ice/heat application parameters Encouraged pt to get up from chair 1x/hour    PATIENT EDUCATION:  Education details: exercise rationale, progression/modification  Person educated: Patient Education method: Programmer, multimedia, Facilities manager, Education comprehension: verbalized understanding, returned demonstration, verbal cues required, and tactile cues required  HOME EXERCISE PROGRAM: Access Code: [facility] URL: [email or web address]   ASSESSMENT:  CLINICAL IMPRESSION: Patient with tender spot lower L lumbar paraspinals. Completed self STM with tennis ball with some improvement in symptoms following. Continued with glute and core strengthening which is tolerated well. Patient will continue to benefit from physical therapy in order to improve function and reduce impairment.    OBJECTIVE IMPAIRMENTS: Abnormal gait, decreased activity tolerance, decreased balance, decreased endurance, decreased mobility, difficulty walking, decreased ROM, decreased strength, increased muscle spasms, impaired flexibility, impaired sensation, improper body mechanics, postural dysfunction, and pain.   ACTIVITY LIMITATIONS: carrying, lifting, bending,  sitting, standing, squatting, stairs, transfers, reach over head, hygiene/grooming, locomotion level, and caring for others  PARTICIPATION LIMITATIONS: meal prep, cleaning, laundry, shopping, community activity, and yard work  PERSONAL FACTORS: Fitness, Time since onset of injury/illness/exacerbation, and 3+ comorbidities: Chronic LBP, hx Lumbar surgery, CKD, hx breast cancer, DM, neuropathy, osteoporosis  are also affecting patient's functional outcome.   REHAB POTENTIAL: Good  CLINICAL DECISION MAKING: Evolving/moderate  complexity  EVALUATION COMPLEXITY: Moderate   GOALS: Goals reviewed with patient? Yes  SHORT TERM GOALS: Target date: 08/11/2023    Patient will be independent with HEP in order to improve functional outcomes. Baseline:  Goal status: IN PROGRESS (performs them "a little bit.") 08/11/23  2.  Patient will report at least 25% improvement in symptoms for improved quality of life. Baseline:  Goal status: MET 08/11/23   LONG TERM GOALS: Target date: 09/08/2023    Patient will report at least 75% improvement in symptoms for improved quality of life. Baseline:  Goal status: INITIAL  2.  Patient will improve modified oswestry score by at least 8 points in order to indicate improved tolerance to activity. Baseline:  Goal status: INITIAL  3.  Patient will demonstrate at least 25% improvement in lumbar ROM in all restricted planes for improved ability to move trunk while completing chores and cooking. Baseline:  Goal status: INITIAL  4.  Patient will be able to complete 5x STS in under 15 seconds in order to demonstrate improved functional mobility/strength Baseline: see above Goal status: In progress - 08/08/23  5.  Patient will demonstrate grade of 5/5 MMT grade in all tested musculature as evidence of improved strength to assist with stair ambulation and gait.   Baseline:  Goal status: INITIAL     PLAN:  PT FREQUENCY: 2x/week  PT DURATION: 8 weeks  PLANNED  INTERVENTIONS: 97164- PT Re-evaluation, 97110-Therapeutic exercises, 97530- Therapeutic activity, 97112- Neuromuscular re-education, 97535- Self Care, 09811- Manual therapy, [phone]- Gait training, [phone]- Orthotic Fit/training, [phone]- Canalith repositioning, [id number]- Aquatic Therapy, [phone]- Splinting, Patient/Family education, Balance training, Stair training, Taping, Dry Needling, Joint mobilization, Joint manipulation, Spinal manipulation, Spinal mobilization, Scar mobilization, and DME instructions.  PLAN FOR NEXT SESSION: f/u with HEP, spinal mobility, core and glute and LE strength, functional strength   Beather Liming, PT 08/18/2023, 11:04 AM  [facility] [address] [place], [place], [address] Phone: [phone]   Fax:  [phone]     Referring diagnosis? M54.50 (ICD-10-CM) - Low back pain, unspecified back pain laterality, unspecified chronicity, unspecified whether sciatica present Treatment diagnosis? (if different than referring diagnosis) m54.59 What was this (referring dx) caused by? []  Surgery []  Fall [x]  Ongoing issue []  Arthritis []  Other: ____________  Laterality: []  Rt []  Lt [x]  Both  Check all possible CPT codes:  *CHOOSE 10 OR LESS*    See Planned Interventions listed in the Plan section of the Evaluation.

## 2023-08-22 ENCOUNTER — Encounter (HOSPITAL_BASED_OUTPATIENT_CLINIC_OR_DEPARTMENT_OTHER): Payer: Self-pay | Admitting: Physical Therapy

## 2023-08-22 ENCOUNTER — Ambulatory Visit (HOSPITAL_BASED_OUTPATIENT_CLINIC_OR_DEPARTMENT_OTHER): Admitting: Physical Therapy

## 2023-08-22 DIAGNOSIS — R2689 Other abnormalities of gait and mobility: Secondary | ICD-10-CM | POA: Diagnosis not present

## 2023-08-22 DIAGNOSIS — M6281 Muscle weakness (generalized): Secondary | ICD-10-CM | POA: Diagnosis not present

## 2023-08-22 DIAGNOSIS — M5459 Other low back pain: Secondary | ICD-10-CM

## 2023-08-22 NOTE — Therapy (Signed)
 OUTPATIENT PHYSICAL THERAPY THORACOLUMBAR TREATMENT   Patient Name: [name] MRN: [id number] DOB:[birth year], 73 y.o., female Today's Date: 08/22/2023  END OF SESSION:  PT End of Session - 08/22/23 1152     Visit Number 9    Number of Visits 16    Date for PT Re-Evaluation 09/08/23    Authorization Type [employer]IC[employer]    Progress Note Due on Visit 10    PT Start Time 1150    PT Stop Time 1228    PT Time Calculation (min) 38 min    Behavior During Therapy [facility] for tasks assessed/performed                Past Medical History:  Diagnosis Date   Anginal pain (HCC) 03/19/2012   saw Dr. Floria Hurst .. she thinks its more related to stomach issues   Arthritis    "all over; mostly in my back" (03/20/2012)   Breast cancer (HCC)    "right" (03/20/2012)   Cardiomyopathy    Chest pain 06/2005   Hospitalized, dystolic dysfunction   Chronic lower back pain    "have 2 herniated discs; going to have to have a fusion" (03/20/2012)   CKD (chronic kidney disease), stage III (HCC)    Family history of anesthesia complication    "father has nausea and vomiting"   Gallstones    GERD (gastroesophageal reflux disease)    H/O hiatal hernia    History of kidney stones    History of right mastectomy    Hypertension    "patient states never had HTN, takes Hyzaar for heart   Hypothyroidism    Irritable bowel syndrome    Migraines    "it's been a long time since I've had one" (03/20/2012)   Osteoarthritis    Peripheral neuropathy    PONV (postoperative nausea and vomiting)    Sciatica    Sinus bradycardia    Sleep apnea    mild, no CPAP   Type II diabetes mellitus (HCC)    Past Surgical History:  Procedure Laterality Date   ABDOMINAL HYSTERECTOMY  1993   adenosin cardiolite   07/2005   (+) wall motion abnormality   AXILLARY LYMPH NODE DISSECTION  2003   squamous cell cancer   BACK SURGERY     CARDIAC CATHETERIZATION  ? 2005 / 2007   CHOLECYSTECTOMY N/A 01/24/2018    Procedure: LAPAROSCOPIC CHOLECYSTECTOMY;  Surgeon: Sim Dryer, MD;  Location: [facility] OR;  Service: General;  Laterality: N/A;   CT abdomen and pelvis  02/2010   Same   ESOPHAGOGASTRODUODENOSCOPY  2005   GERD   KNEE ARTHROSCOPY  1990's   "right" (03/20/2012)   LUMBAR FUSION N/A 08/22/2014   Procedure: Right L2-3 and right L1-2 transforaminal lumbar interbody fusion with pedicle screws, rods, sleeves, cages, local bone graft, Vivigen, cancellous chips;  Surgeon: Alphonso Jean, MD;  Location: [facility] OR;  Service: Orthopedics;  Laterality: N/A;   LUMBAR LAMINECTOMY/DECOMPRESSION MICRODISCECTOMY N/A 02/04/2014   Procedure: RIGHT L2-3 MICRODISCECTOMY ;  Surgeon: Alphonso Jean, MD;  Location: [facility] OR;  Service: Orthopedics;  Laterality: N/A;   MASTECTOMY WITH AXILLARY LYMPH NODE DISSECTION  12/2003   "right" (03/20/2012)   PARATHYROIDECTOMY Left 10/11/2022   Procedure: MINIMALLY INVASIVE LEFT INFERIOR PARATHYROIDECTOMY;  Surgeon: Shela Derby, MD;  Location: [facility] OR;  Service: General;  Laterality: Left;   POSTERIOR CERVICAL FUSION/FORAMINOTOMY  2001   SHOULDER ARTHROSCOPY W/ ROTATOR CUFF REPAIR  2003   Left   THYROIDECTOMY  1977   Right  TOTAL KNEE ARTHROPLASTY  2000   Right   US  of abdomen  07/2005   Fatty liver / kidney stones   Patient Active Problem List   Diagnosis Date Noted   Urinary incontinence 08/09/2023   Metallic taste 05/11/2023   S/P parathyroidectomy 10/11/2022   Allergic rhinitis 07/25/2022   Syncope 07/21/2022   Sinus bradycardia 07/21/2022   Hammer toe of second toe of left foot 05/05/2021   Corns and callosities 05/05/2021   Hyperlipidemia associated with type 2 diabetes mellitus (HCC) 02/01/2021   Depression 02/01/2021   Localized osteoporosis without current pathological fracture 05/01/2020   Hyperparathyroidism (HCC) 12/13/2019   Elevated hemoglobin (HCC) 12/13/2019   Chronic renal impairment, stage 4 (severe) (HCC) 10/28/2019   Anemia 10/28/2019   Physical exam  06/26/2019   Neck pain 04/18/2019   Ascending aortic aneurysm (HCC) 04/17/2019   Nonspecific chest pain 04/15/2019   Neuropathic pain of right flank 12/27/2018   Dysphagia 12/27/2018   Postsurgical hypothyroidism 06/11/2018   Vitamin D  deficiency 06/11/2018   Multinodular goiter 06/11/2018   DM type 2 causing vascular disease (HCC)    Peripheral neuropathy    Migraine    Irritable bowel syndrome    Essential hypertension    History of right mastectomy    H/O hiatal hernia    GERD (gastroesophageal reflux disease)    Family history of anesthesia complication    Other chronic pain    Type 2 diabetes mellitus with diabetic chronic kidney disease (HCC)    Lower extremity edema    Chronic constipation 06/23/2015   Chronic diastolic heart failure (HCC) 11/17/2014   Herniated nucleus pulposus, L2-3 right 02/04/2014    Class: Acute   Spondylolisthesis of lumbar region 04/30/2012    Class: Chronic   Lumbar stenosis with neurogenic claudication 04/30/2012    Class: Chronic   SYNCOPE-CAROTID SINUS 09/16/2008   OSTEOARTHRITIS 08/23/2006   DDD (degenerative disc disease), cervical 08/23/2006   DISC DISEASE, LUMBAR 08/23/2006   Headache(784.0) 08/23/2006   BREAST CANCER, HX OF 08/23/2006   RENAL CALCULUS, HX OF 08/23/2006    PCP: Jess Morita, MD   REFERRING PROVIDER: Diedra Fowler, MD  REFERRING DIAG: M54.50 (ICD-10-CM) - Low back pain, unspecified back pain laterality, unspecified chronicity, unspecified whether sciatica present  Rationale for Evaluation and Treatment: Rehabilitation  THERAPY DIAG:  Muscle weakness (generalized)  Other abnormalities of gait and mobility  Other low back pain  ONSET DATE: chronic   SUBJECTIVE:  SUBJECTIVE STATEMENT: Pt reports had trouble with STM  with ball, had husband push on the spot and massage it. HEP going well. Gets a little tired following PT, fine the next day.   PERTINENT HISTORY:  Chronic LBP, hx Lumbar surgery, CKD, hx breast cancer, DM, neuropathy, osteoporosis, does have spinal cord stimulator  PAIN:  Are you having pain? Yes: NPRS scale: 3/10 Pain location: low back, abdomen  Pain description: achy Aggravating factors: walking/standing Relieving factors: laying down  PRECAUTIONS: None  WEIGHT BEARING RESTRICTIONS: No  FALLS:  Has patient fallen in last 6 months? No  PLOF: Independent  PATIENT GOALS: stand up and cook   OBJECTIVE: (objective measures from initial evaluation unless otherwise dated)  PATIENT SURVEYS: Modified Oswestry 30/50   SCREENING FOR RED FLAGS: Bowel or bladder incontinence: No Spinal tumors: No Cauda equina syndrome: No Compression fracture: No Abdominal aneurysm: No  COGNITION: Overall cognitive status: Within functional limits for tasks assessed     SENSATION: WFL  POSTURE: rounded shoulders, forward head, decreased lumbar lordosis, increased thoracic kyphosis, and flexed trunk   PALPATION: TTP lumbar and thoracic paraspinals with greatest tenderness throughout lumbar spine.  LUMBAR ROM:   AROM eval  Flexion 25% limited  Extension 90% limited *  Right lateral flexion 75% limited *  Left lateral flexion 75% limited*  Right rotation 50% limited*  Left rotation 50% limited*   (Blank rows = not tested) * = pain/symptoms  LOWER EXTREMITY ROM:     Active  Right eval Left eval  Hip flexion    Hip extension    Hip abduction    Hip adduction    Hip internal rotation    Hip external rotation    Knee flexion    Knee extension    Ankle dorsiflexion    Ankle plantarflexion    Ankle inversion    Ankle eversion     (Blank rows = not tested) * = pain/symptoms  LOWER EXTREMITY MMT:  unable to test extension as patient unable to get into prone  MMT Right eval  Left eval  Hip flexion 4+ 4+  Hip extension    Hip abduction 4- 3+  Hip adduction    Hip internal rotation    Hip external rotation    Knee flexion 4+ 4+  Knee extension 4+ 4+  Ankle dorsiflexion 4+ 4+  Ankle plantarflexion    Ankle inversion    Ankle eversion     (Blank rows = not tested) * = pain/symptoms    FUNCTIONAL TESTS:  5 times sit to stand: 27.59 seconds with UE use 08/08/23:  22.5s with UE use  GAIT: Distance walked: 100 feet Assistive device utilized: Single point cane Level of assistance: Modified independence Comments: labored, decreased foot clearance  TODAY'S TREATMENT:  DATE:  08/22/23 STS 1 x 10 Step up 4 inch 2 x 10 Standing row RTB 2 x 10 Standing shoulder extension RTB 2 x 10 Palof press RTB 1 x 10 Lateral step up 4 inch 1 x 10 Standing alternating march 2 x 10 Seated shoulder flexion with ball 2 x 10  08/18/23 Self STM with tennis ball to lower lumbar paraspinals Standing hip abduction RTB at knees 3 x 10 Standing hip extension RTB at knees 2 x 10 Seated trunk flexion stretch 3 way stretch 5 x 5 second holds each Step up 4 inch 1 x 10 Lateral step up 4 inch 1 x 10  [facility] Adult PT Treatment:                                                DATE: 08/15/23 NuStep LE/UE, L4: 6 min At  counter:  Side stepping x 2laps  Heel/toe raises x 15 Trunk ext to neutral Backward gait / forward march with tactile cues for more vertical trunk; "L" stretch at sink x 2; return to backward gait/ forward marching x 2 lap Bilat arm overhead reach with ball above eye level x10, x10each diagonal Leaning on counter, wag the tail Hip abdct/ addct x 10 each LE  Forward 4" step ups x 5 each LE  [facility] Adult PT Treatment:                                                DATE: 08/11/23 NuStep LE/UE, L5: 6 min   Standing shoulder ext with red band x 10;   bilat shoulder row with red band x 10  Seated ball roll stretch on table for lumbar flexion 5" x10 STS with forward arm reach x 10  (slightly elevated plinth) At rail:  Side stepping x2laps  Single arm overhead reach with ball above eye level x10each arm straight up, x10each diagonal Standing march 2x10 HR/TR x20 (with core activation) Forward step ups 4" with opp arm on rail (x 10 LLE, x 10 RLE) with cues for core engagement Seated Shoulder rolls x15 Seated 2# in/out from chest x 15 Seated marching 2x10ea Seated LAQ 2x10ea Gait in hall with SPC x1 lap   [facility] Adult PT Treatment:                                                DATE: 08/08/23 NuStep LE, L5: 6 min  (~45-60 SPM) Standing shoulder ext with red band x 10;  bilat shoulder row with red band x 10  "L " stretch at sink x 2; trunk ext to neutral STS (timed) then STS with forward arm reach x 10 total At counter:  Side stepping R/L 61ft x 3 Single arm overhead reach with ball shelf above eye level x 5-7 each arm High knee Marching Forward step ups 4" with opp arm on counter (x 8 LLE, x 5 RLE) with cues for core engagement Seated Shoulder rolls Seated 2# in/out from chest x 10     PATIENT EDUCATION:  Education details: exercise rationale, progression/modification  Person educated: Patient  Education method: Explanation, Demonstration, Education comprehension: verbalized understanding, returned demonstration, verbal cues required, and tactile cues required  HOME EXERCISE PROGRAM: Access Code: [facility] URL: [email or web address]   ASSESSMENT:  CLINICAL IMPRESSION: Continued with functional strengthening which is tolerated well. Intermittent cueing for posture, sequencing, mechanics with good/ fair carry over. Moderate fatigue at EOS. Patient will continue to benefit from physical therapy in order to improve function and reduce impairment.    OBJECTIVE IMPAIRMENTS: Abnormal gait, decreased activity  tolerance, decreased balance, decreased endurance, decreased mobility, difficulty walking, decreased ROM, decreased strength, increased muscle spasms, impaired flexibility, impaired sensation, improper body mechanics, postural dysfunction, and pain.   ACTIVITY LIMITATIONS: carrying, lifting, bending, sitting, standing, squatting, stairs, transfers, reach over head, hygiene/grooming, locomotion level, and caring for others  PARTICIPATION LIMITATIONS: meal prep, cleaning, laundry, shopping, community activity, and yard work  PERSONAL FACTORS: Fitness, Time since onset of injury/illness/exacerbation, and 3+ comorbidities: Chronic LBP, hx Lumbar surgery, CKD, hx breast cancer, DM, neuropathy, osteoporosis  are also affecting patient's functional outcome.   REHAB POTENTIAL: Good  CLINICAL DECISION MAKING: Evolving/moderate complexity  EVALUATION COMPLEXITY: Moderate   GOALS: Goals reviewed with patient? Yes  SHORT TERM GOALS: Target date: 08/11/2023    Patient will be independent with HEP in order to improve functional outcomes. Baseline:  Goal status: IN PROGRESS (performs them "a little bit.") 08/11/23  2.  Patient will report at least 25% improvement in symptoms for improved quality of life. Baseline:  Goal status: MET 08/11/23   LONG TERM GOALS: Target date: 09/08/2023    Patient will report at least 75% improvement in symptoms for improved quality of life. Baseline:  Goal status: INITIAL  2.  Patient will improve modified oswestry score by at least 8 points in order to indicate improved tolerance to activity. Baseline:  Goal status: INITIAL  3.  Patient will demonstrate at least 25% improvement in lumbar ROM in all restricted planes for improved ability to move trunk while completing chores and cooking. Baseline:  Goal status: INITIAL  4.  Patient will be able to complete 5x STS in under 15 seconds in order to demonstrate improved functional mobility/strength Baseline: see  above Goal status: In progress - 08/08/23  5.  Patient will demonstrate grade of 5/5 MMT grade in all tested musculature as evidence of improved strength to assist with stair ambulation and gait.   Baseline:  Goal status: INITIAL     PLAN:  PT FREQUENCY: 2x/week  PT DURATION: 8 weeks  PLANNED INTERVENTIONS: 97164- PT Re-evaluation, 97110-Therapeutic exercises, 97530- Therapeutic activity, 97112- Neuromuscular re-education, 97535- Self Care, 21308- Manual therapy, [phone]- Gait training, [phone]- Orthotic Fit/training, [phone]- Canalith repositioning, [id number]- Aquatic Therapy, [phone]- Splinting, Patient/Family education, Balance training, Stair training, Taping, Dry Needling, Joint mobilization, Joint manipulation, Spinal manipulation, Spinal mobilization, Scar mobilization, and DME instructions.  PLAN FOR NEXT SESSION: f/u with HEP, spinal mobility, core and glute and LE strength, functional strength   Beather Liming, PT 08/22/2023, 12:26 PM  [facility] [address] [place], [place], [address] Phone: [phone]   Fax:  [phone]     Referring diagnosis? M54.50 (ICD-10-CM) - Low back pain, unspecified back pain laterality, unspecified chronicity, unspecified whether sciatica present Treatment diagnosis? (if different than referring diagnosis) m54.59 What was this (referring dx) caused by? []  Surgery []  Fall [x]  Ongoing issue []  Arthritis []  Other: ____________  Laterality: []  Rt []  Lt [x]  Both  Check all possible CPT codes:  *CHOOSE 10 OR LESS*    See  Planned Interventions listed in the Plan section of the Evaluation.

## 2023-08-25 ENCOUNTER — Encounter (HOSPITAL_BASED_OUTPATIENT_CLINIC_OR_DEPARTMENT_OTHER)

## 2023-08-29 ENCOUNTER — Encounter (HOSPITAL_BASED_OUTPATIENT_CLINIC_OR_DEPARTMENT_OTHER): Payer: Self-pay

## 2023-08-29 ENCOUNTER — Ambulatory Visit (HOSPITAL_BASED_OUTPATIENT_CLINIC_OR_DEPARTMENT_OTHER)

## 2023-08-29 DIAGNOSIS — M6281 Muscle weakness (generalized): Secondary | ICD-10-CM

## 2023-08-29 DIAGNOSIS — M5459 Other low back pain: Secondary | ICD-10-CM | POA: Diagnosis not present

## 2023-08-29 DIAGNOSIS — R2689 Other abnormalities of gait and mobility: Secondary | ICD-10-CM | POA: Diagnosis not present

## 2023-08-29 DIAGNOSIS — R69 Illness, unspecified: Secondary | ICD-10-CM | POA: Diagnosis not present

## 2023-08-29 NOTE — Therapy (Addendum)
 OUTPATIENT PHYSICAL THERAPY THORACOLUMBAR PROGRESS NOTE Progress Note Reporting Period 07/14/2023 to 09/08/2023  See note below for Objective Data and Assessment of Progress/Goals.       Patient Name: [name] MRN: [id number] DOB:29-Dec-1950, 73 y.o., female Today's Date: 08/29/2023  END OF SESSION:  PT End of Session - 08/29/23 1105     Visit Number 10    Number of Visits 16    Date for PT Re-Evaluation 09/08/23    Authorization Type [employer]IC[employer]    Progress Note Due on Visit 10    PT Start Time 1102    PT Stop Time 1145    PT Time Calculation (min) 43 min    Activity Tolerance Patient tolerated treatment well    Behavior During Therapy [facility] for tasks assessed/performed                 Past Medical History:  Diagnosis Date   Anginal pain (HCC) 03/19/2012   saw Dr. Floria Hurst .. she thinks its more related to stomach issues   Arthritis    "all over; mostly in my back" (03/20/2012)   Breast cancer (HCC)    "right" (03/20/2012)   Cardiomyopathy    Chest pain 06/2005   Hospitalized, dystolic dysfunction   Chronic lower back pain    "have 2 herniated discs; going to have to have a fusion" (03/20/2012)   CKD (chronic kidney disease), stage III (HCC)    Family history of anesthesia complication    "father has nausea and vomiting"   Gallstones    GERD (gastroesophageal reflux disease)    H/O hiatal hernia    History of kidney stones    History of right mastectomy    Hypertension    "patient states never had HTN, takes Hyzaar for heart   Hypothyroidism    Irritable bowel syndrome    Migraines    "it's been a long time since I've had one" (03/20/2012)   Osteoarthritis    Peripheral neuropathy    PONV (postoperative nausea and vomiting)    Sciatica    Sinus bradycardia    Sleep apnea    mild, no CPAP   Type II diabetes mellitus (HCC)    Past Surgical History:  Procedure Laterality Date   ABDOMINAL HYSTERECTOMY  1993   adenosin cardiolite    07/2005   (+) wall motion abnormality   AXILLARY LYMPH NODE DISSECTION  2003   squamous cell cancer   BACK SURGERY     CARDIAC CATHETERIZATION  ? 2005 / 2007   CHOLECYSTECTOMY N/A 01/24/2018   Procedure: LAPAROSCOPIC CHOLECYSTECTOMY;  Surgeon: Sim Dryer, MD;  Location: [facility] OR;  Service: General;  Laterality: N/A;   CT abdomen and pelvis  02/2010   Same   ESOPHAGOGASTRODUODENOSCOPY  2005   GERD   KNEE ARTHROSCOPY  1990's   "right" (03/20/2012)   LUMBAR FUSION N/A 08/22/2014   Procedure: Right L2-3 and right L1-2 transforaminal lumbar interbody fusion with pedicle screws, rods, sleeves, cages, local bone graft, Vivigen, cancellous chips;  Surgeon: Alphonso Jean, MD;  Location: [facility] OR;  Service: Orthopedics;  Laterality: N/A;   LUMBAR LAMINECTOMY/DECOMPRESSION MICRODISCECTOMY N/A 02/04/2014   Procedure: RIGHT L2-3 MICRODISCECTOMY ;  Surgeon: Alphonso Jean, MD;  Location: [facility] OR;  Service: Orthopedics;  Laterality: N/A;   MASTECTOMY WITH AXILLARY LYMPH NODE DISSECTION  12/2003   "right" (03/20/2012)   PARATHYROIDECTOMY Left 10/11/2022   Procedure: MINIMALLY INVASIVE LEFT INFERIOR PARATHYROIDECTOMY;  Surgeon: Shela Derby, MD;  Location: [facility] OR;  Service: General;  Laterality: Left;   POSTERIOR CERVICAL FUSION/FORAMINOTOMY  2001   SHOULDER ARTHROSCOPY W/ ROTATOR CUFF REPAIR  2003   Left   THYROIDECTOMY  1977   Right   TOTAL KNEE ARTHROPLASTY  2000   Right   US  of abdomen  07/2005   Fatty liver / kidney stones   Patient Active Problem List   Diagnosis Date Noted   Urinary incontinence 08/09/2023   Metallic taste 05/11/2023   S/P parathyroidectomy 10/11/2022   Allergic rhinitis 07/25/2022   Syncope 07/21/2022   Sinus bradycardia 07/21/2022   Hammer toe of second toe of left foot 05/05/2021   Corns and callosities 05/05/2021   Hyperlipidemia associated with type 2 diabetes mellitus (HCC) 02/01/2021   Depression 02/01/2021   Localized osteoporosis without current pathological  fracture 05/01/2020   Hyperparathyroidism (HCC) 12/13/2019   Elevated hemoglobin (HCC) 12/13/2019   Chronic renal impairment, stage 4 (severe) (HCC) 10/28/2019   Anemia 10/28/2019   Physical exam 06/26/2019   Neck pain 04/18/2019   Ascending aortic aneurysm (HCC) 04/17/2019   Nonspecific chest pain 04/15/2019   Neuropathic pain of right flank 12/27/2018   Dysphagia 12/27/2018   Postsurgical hypothyroidism 06/11/2018   Vitamin D  deficiency 06/11/2018   Multinodular goiter 06/11/2018   DM type 2 causing vascular disease (HCC)    Peripheral neuropathy    Migraine    Irritable bowel syndrome    Essential hypertension    History of right mastectomy    H/O hiatal hernia    GERD (gastroesophageal reflux disease)    Family history of anesthesia complication    Other chronic pain    Type 2 diabetes mellitus with diabetic chronic kidney disease (HCC)    Lower extremity edema    Chronic constipation 06/23/2015   Chronic diastolic heart failure (HCC) 11/17/2014   Herniated nucleus pulposus, L2-3 right 02/04/2014    Class: Acute   Spondylolisthesis of lumbar region 04/30/2012    Class: Chronic   Lumbar stenosis with neurogenic claudication 04/30/2012    Class: Chronic   SYNCOPE-CAROTID SINUS 09/16/2008   OSTEOARTHRITIS 08/23/2006   DDD (degenerative disc disease), cervical 08/23/2006   DISC DISEASE, LUMBAR 08/23/2006   Headache(784.0) 08/23/2006   BREAST CANCER, HX OF 08/23/2006   RENAL CALCULUS, HX OF 08/23/2006    PCP: Jess Morita, MD   REFERRING PROVIDER: Diedra Fowler, MD  REFERRING DIAG: M54.50 (ICD-10-CM) - Low back pain, unspecified back pain laterality, unspecified chronicity, unspecified whether sciatica present  Rationale for Evaluation and Treatment: Rehabilitation  THERAPY DIAG:  Muscle weakness (generalized)  Other abnormalities of gait and mobility  Other low back pain  ONSET DATE: chronic   SUBJECTIVE:  SUBJECTIVE STATEMENT: Pt reports 4/10 pain level in low back. "It's doing okay today." Tennis ball hurts, so she switched to a larger, softer ball which seems to help.   PERTINENT HISTORY:  Chronic LBP, hx Lumbar surgery, CKD, hx breast cancer, DM, neuropathy, osteoporosis, does have spinal cord stimulator  PAIN:  Are you having pain? Yes: NPRS scale: 3/10 Pain location: low back, abdomen  Pain description: achy Aggravating factors: walking/standing Relieving factors: laying down  PRECAUTIONS: None  WEIGHT BEARING RESTRICTIONS: No  FALLS:  Has patient fallen in last 6 months? No  PLOF: Independent  PATIENT GOALS: stand up and cook   OBJECTIVE: (objective measures from initial evaluation unless otherwise dated)  PATIENT SURVEYS: Modified Oswestry 30/50  5/20: 24/50  SCREENING FOR RED FLAGS: Bowel or bladder incontinence: No Spinal tumors: No Cauda equina syndrome: No Compression fracture: No Abdominal aneurysm: No  COGNITION: Overall cognitive status: Within functional limits for tasks assessed     SENSATION: WFL  POSTURE: rounded shoulders, forward head, decreased lumbar lordosis, increased thoracic kyphosis, and flexed trunk   PALPATION: TTP lumbar and thoracic paraspinals with greatest tenderness throughout lumbar spine.  LUMBAR ROM:   AROM eval 5/20  Flexion 25% limited Full  Extension 90% limited * 40% limited  Right lateral flexion 75% limited * 25% limited*  Left lateral flexion 75% limited* 40% limited  Right rotation 50% limited* 15% limited  Left rotation 50% limited* Full   (Blank rows = not tested) * = pain/symptoms  LOWER EXTREMITY ROM:     Active  Right eval Left eval  Hip flexion    Hip extension    Hip abduction    Hip adduction    Hip internal rotation    Hip external rotation    Knee  flexion    Knee extension    Ankle dorsiflexion    Ankle plantarflexion    Ankle inversion    Ankle eversion     (Blank rows = not tested) * = pain/symptoms  LOWER EXTREMITY MMT:  unable to test extension as patient unable to get into prone  MMT Right eval Left eval Right 5/20 Left 5/20  Hip flexion 4+ 4+ 4+ 4  Hip extension      Hip abduction 4- 3+ 4+ (seated) 4-(seated)  Hip adduction      Hip internal rotation      Hip external rotation      Knee flexion 4+ 4+ 4+ 4  Knee extension 4+ 4+ 4+ 4  Ankle dorsiflexion 4+ 4+ 5 4+  Ankle plantarflexion      Ankle inversion      Ankle eversion       (Blank rows = not tested) * = pain/symptoms    FUNCTIONAL TESTS:  5 times sit to stand: 27.59 seconds with UE use 08/08/23:  22.5s with UE use 5/20: 20.03s without UE use (lowered plinth)  GAIT: Distance walked: 100 feet Assistive device utilized: Single point cane Level of assistance: Modified independence Comments: labored, decreased foot clearance  TODAY'S TREATMENT:  DATE:   08/29/23 Updated MMT Reviewed Goals 5xSTS Modified  Oswestry 24/50 Lumbar ROM update STM (seated in chair leaning over plinth) to lumbar paraspinals     08/22/23 STS 1 x 10 Step up 4 inch 2 x 10 Standing row RTB 2 x 10 Standing shoulder extension RTB 2 x 10 Palof press RTB 1 x 10 Lateral step up 4 inch 1 x 10 Standing alternating march 2 x 10 Seated shoulder flexion with ball 2 x 10  08/18/23 Self STM with tennis ball to lower lumbar paraspinals Standing hip abduction RTB at knees 3 x 10 Standing hip extension RTB at knees 2 x 10 Seated trunk flexion stretch 3 way stretch 5 x 5 second holds each Step up 4 inch 1 x 10 Lateral step up 4 inch 1 x 10  [facility] Adult PT Treatment:                                                DATE: 08/15/23 NuStep LE/UE, L4: 6 min At   counter:  Side stepping x 2laps  Heel/toe raises x 15 Trunk ext to neutral Backward gait / forward march with tactile cues for more vertical trunk; "L" stretch at sink x 2; return to backward gait/ forward marching x 2 lap Bilat arm overhead reach with ball above eye level x10, x10each diagonal Leaning on counter, wag the tail Hip abdct/ addct x 10 each LE  Forward 4" step ups x 5 each LE  [facility] Adult PT Treatment:                                                DATE: 08/11/23 NuStep LE/UE, L5: 6 min   Standing shoulder ext with red band x 10;  bilat shoulder row with red band x 10  Seated ball roll stretch on table for lumbar flexion 5" x10 STS with forward arm reach x 10  (slightly elevated plinth) At rail:  Side stepping x2laps  Single arm overhead reach with ball above eye level x10each arm straight up, x10each diagonal Standing march 2x10 HR/TR x20 (with core activation) Forward step ups 4" with opp arm on rail (x 10 LLE, x 10 RLE) with cues for core engagement Seated Shoulder rolls x15 Seated 2# in/out from chest x 15 Seated marching 2x10ea Seated LAQ 2x10ea Gait in hall with SPC x1 lap   [facility] Adult PT Treatment:                                                DATE: 08/08/23 NuStep LE, L5: 6 min  (~45-60 SPM) Standing shoulder ext with red band x 10;  bilat shoulder row with red band x 10  "L " stretch at sink x 2; trunk ext to neutral STS (timed) then STS with forward arm reach x 10 total At counter:  Side stepping R/L 77ft x 3 Single arm overhead reach with ball shelf above eye level x 5-7 each arm High knee Marching Forward step ups 4" with opp arm on counter (x 8 LLE, x 5 RLE) with cues for  core engagement Seated Shoulder rolls Seated 2# in/out from chest x 10     PATIENT EDUCATION:  Education details: exercise rationale, progression/modification  Person educated: Patient Education method: Explanation, Demonstration, Education comprehension: verbalized  understanding, returned demonstration, verbal cues required, and tactile cues required  HOME EXERCISE PROGRAM: Access Code: [facility] URL: [email or web address]   ASSESSMENT:  CLINICAL IMPRESSION: Pt has attended 10 visits of PT thus far and is making improvements in lumbar ROM, citing decreased pain with movement as well. Minimal improvement noted with LE strength testing, with even some decrease in strength. She has met 2/2 STG and 1/5 LTG. She reports decreased pain overall with PT, though still has constant pain in middle of lumbar region. Feels most relief with STM in clinic and with husband massaging her back at home. Pt reports limitations with ADLS not only due to LBP, but also due to poor balance and L knee weakness. Reports L knee buckles 1-2 times per day. Denies falls. Has been trying to avoid a L knee replacement. Modified ODI improved to 24/50. Pt will benefit from additional PT to address ongoing deficits.  Patient has met 2/2 short term goals and 1/5 long term goals with ability to complete HEP and improvement in symptoms, ROM.Remaining goals not met due to continued deficits in strength, activity tolerance, posture, and functional mobility. Patient has made good progress toward remaining goals. Extending POC 1-2x/week for 6 weeks to work toward remaining goals. Patient will continue to benefit from skilled physical therapy in order to improve function and reduce impairment.  12:54 PM, 08/30/23 Beather Liming PT, DPT Physical Therapist at [facility]    OBJECTIVE IMPAIRMENTS: Abnormal gait, decreased activity tolerance, decreased balance, decreased endurance, decreased mobility, difficulty walking, decreased ROM, decreased strength, increased muscle spasms, impaired flexibility, impaired sensation, improper body mechanics, postural dysfunction, and pain.   ACTIVITY LIMITATIONS: carrying, lifting, bending, sitting, standing, squatting, stairs, transfers, reach  over head, hygiene/grooming, locomotion level, and caring for others  PARTICIPATION LIMITATIONS: meal prep, cleaning, laundry, shopping, community activity, and yard work  PERSONAL FACTORS: Fitness, Time since onset of injury/illness/exacerbation, and 3+ comorbidities: Chronic LBP, hx Lumbar surgery, CKD, hx breast cancer, DM, neuropathy, osteoporosis  are also affecting patient's functional outcome.   REHAB POTENTIAL: Good  CLINICAL DECISION MAKING: Evolving/moderate complexity  EVALUATION COMPLEXITY: Moderate   GOALS: Goals reviewed with patient? Yes  SHORT TERM GOALS: Target date: 08/11/2023    Patient will be independent with HEP in order to improve functional outcomes. Baseline:  Goal status: MET 5/20  2.  Patient will report at least 25% improvement in symptoms for improved quality of life. Baseline:  Goal status: MET 08/11/23   LONG TERM GOALS: Target date: 09/08/2023    Patient will report at least 75% improvement in symptoms for improved quality of life. Baseline:  Goal status: IN PROGRESS  2.  Patient will improve modified oswestry score by at least 8 points in order to indicate improved tolerance to activity. Baseline:  Goal status: IN PROGRESS (improved by 6 points 5/20)  3.  Patient will demonstrate at least 25% improvement in lumbar ROM in all restricted planes for improved ability to move trunk while completing chores and cooking. Baseline:  Goal status: MET 5/20  4.  Patient will be able to complete 5x STS in under 15 seconds in order to demonstrate improved functional mobility/strength Baseline: see above Goal status: In progress - 5/20  5.  Patient will demonstrate grade of 5/5 MMT grade in all tested musculature  as evidence of improved strength to assist with stair ambulation and gait.   Baseline:  Goal status: IN PROGRESS 5/20     PLAN:  PT FREQUENCY: 2x/week  PT DURATION: 8 weeks  PLANNED INTERVENTIONS: 97164- PT Re-evaluation,  97110-Therapeutic exercises, 97530- Therapeutic activity, 97112- Neuromuscular re-education, 97535- Self Care, 16109- Manual therapy, [phone]- Gait training, [phone]- Orthotic Fit/training, [phone]- Canalith repositioning, [id number]- Aquatic Therapy, [phone]- Splinting, Patient/Family education, Balance training, Stair training, Taping, Dry Needling, Joint mobilization, Joint manipulation, Spinal manipulation, Spinal mobilization, Scar mobilization, and DME instructions.  PLAN FOR NEXT SESSION: f/u with HEP, spinal mobility, core and glute and LE strength, functional strength   Judie Noun, PTA 08/29/2023, 4:55 PM  [facility] [address] [place], [place], [address] Phone: [phone]   Fax:  [phone]     Referring diagnosis? M54.50 (ICD-10-CM) - Low back pain, unspecified back pain laterality, unspecified chronicity, unspecified whether sciatica present Treatment diagnosis? (if different than referring diagnosis) m54.59 What was this (referring dx) caused by? []  Surgery []  Fall [x]  Ongoing issue []  Arthritis []  Other: ____________  Laterality: []  Rt []  Lt [x]  Both  Check all possible CPT codes:  *CHOOSE 10 OR LESS*    See Planned Interventions listed in the Plan section of the Evaluation.

## 2023-08-30 NOTE — Addendum Note (Signed)
 Addended by: Demitrios Molyneux S on: 08/30/2023 12:56 PM   Modules accepted: Orders

## 2023-09-01 ENCOUNTER — Encounter (HOSPITAL_BASED_OUTPATIENT_CLINIC_OR_DEPARTMENT_OTHER): Payer: Self-pay | Admitting: Physical Therapy

## 2023-09-01 ENCOUNTER — Ambulatory Visit (HOSPITAL_BASED_OUTPATIENT_CLINIC_OR_DEPARTMENT_OTHER): Admitting: Physical Therapy

## 2023-09-01 DIAGNOSIS — M5459 Other low back pain: Secondary | ICD-10-CM

## 2023-09-01 DIAGNOSIS — R2689 Other abnormalities of gait and mobility: Secondary | ICD-10-CM | POA: Diagnosis not present

## 2023-09-01 DIAGNOSIS — M6281 Muscle weakness (generalized): Secondary | ICD-10-CM | POA: Diagnosis not present

## 2023-09-01 NOTE — Therapy (Signed)
 OUTPATIENT PHYSICAL THERAPY THORACOLUMBAR PROGRESS NOTE       Patient Name: RHINA KRAMME MRN: [id number] DOB:[birth year], 73 y.o., female Today's Date: 09/01/2023  END OF SESSION:  PT End of Session - 09/01/23 1103     Visit Number 11    Number of Visits 28    Date for PT Re-Evaluation 10/11/23    Authorization Type [employer]IC[employer]    Progress Note Due on Visit 20    PT Start Time 1102    PT Stop Time 1142    PT Time Calculation (min) 40 min    Activity Tolerance Patient tolerated treatment well    Behavior During Therapy [facility] for tasks assessed/performed                 Past Medical History:  Diagnosis Date   Anginal pain (HCC) 03/19/2012   saw Dr. Floria Hurst .. she thinks its more related to stomach issues   Arthritis    "all over; mostly in my back" (03/20/2012)   Breast cancer (HCC)    "right" (03/20/2012)   Cardiomyopathy    Chest pain 06/2005   Hospitalized, dystolic dysfunction   Chronic lower back pain    "have 2 herniated discs; going to have to have a fusion" (03/20/2012)   CKD (chronic kidney disease), stage III (HCC)    Family history of anesthesia complication    "father has nausea and vomiting"   Gallstones    GERD (gastroesophageal reflux disease)    H/O hiatal hernia    History of kidney stones    History of right mastectomy    Hypertension    "patient states never had HTN, takes Hyzaar for heart   Hypothyroidism    Irritable bowel syndrome    Migraines    "it's been a long time since I've had one" (03/20/2012)   Osteoarthritis    Peripheral neuropathy    PONV (postoperative nausea and vomiting)    Sciatica    Sinus bradycardia    Sleep apnea    mild, no CPAP   Type II diabetes mellitus (HCC)    Past Surgical History:  Procedure Laterality Date   ABDOMINAL HYSTERECTOMY  1993   adenosin cardiolite   07/2005   (+) wall motion abnormality   AXILLARY LYMPH NODE DISSECTION  2003   squamous cell cancer   BACK SURGERY     CARDIAC  CATHETERIZATION  ? 2005 / 2007   CHOLECYSTECTOMY N/A 01/24/2018   Procedure: LAPAROSCOPIC CHOLECYSTECTOMY;  Surgeon: Sim Dryer, MD;  Location: [facility] OR;  Service: General;  Laterality: N/A;   CT abdomen and pelvis  02/2010   Same   ESOPHAGOGASTRODUODENOSCOPY  2005   GERD   KNEE ARTHROSCOPY  1990's   "right" (03/20/2012)   LUMBAR FUSION N/A 08/22/2014   Procedure: Right L2-3 and right L1-2 transforaminal lumbar interbody fusion with pedicle screws, rods, sleeves, cages, local bone graft, Vivigen, cancellous chips;  Surgeon: Alphonso Jean, MD;  Location: [facility] OR;  Service: Orthopedics;  Laterality: N/A;   LUMBAR LAMINECTOMY/DECOMPRESSION MICRODISCECTOMY N/A 02/04/2014   Procedure: RIGHT L2-3 MICRODISCECTOMY ;  Surgeon: Alphonso Jean, MD;  Location: [facility] OR;  Service: Orthopedics;  Laterality: N/A;   MASTECTOMY WITH AXILLARY LYMPH NODE DISSECTION  12/2003   "right" (03/20/2012)   PARATHYROIDECTOMY Left 10/11/2022   Procedure: MINIMALLY INVASIVE LEFT INFERIOR PARATHYROIDECTOMY;  Surgeon: Shela Derby, MD;  Location: [facility] OR;  Service: General;  Laterality: Left;   POSTERIOR CERVICAL FUSION/FORAMINOTOMY  2001   SHOULDER ARTHROSCOPY W/ ROTATOR  CUFF REPAIR  2003   Left   THYROIDECTOMY  1977   Right   TOTAL KNEE ARTHROPLASTY  2000   Right   US  of abdomen  07/2005   Fatty liver / kidney stones   Patient Active Problem List   Diagnosis Date Noted   Urinary incontinence 08/09/2023   Metallic taste 05/11/2023   S/P parathyroidectomy 10/11/2022   Allergic rhinitis 07/25/2022   Syncope 07/21/2022   Sinus bradycardia 07/21/2022   Hammer toe of second toe of left foot 05/05/2021   Corns and callosities 05/05/2021   Hyperlipidemia associated with type 2 diabetes mellitus (HCC) 02/01/2021   Depression 02/01/2021   Localized osteoporosis without current pathological fracture 05/01/2020   Hyperparathyroidism (HCC) 12/13/2019   Elevated hemoglobin (HCC) 12/13/2019   Chronic renal impairment,  stage 4 (severe) (HCC) 10/28/2019   Anemia 10/28/2019   Physical exam 06/26/2019   Neck pain 04/18/2019   Ascending aortic aneurysm (HCC) 04/17/2019   Nonspecific chest pain 04/15/2019   Neuropathic pain of right flank 12/27/2018   Dysphagia 12/27/2018   Postsurgical hypothyroidism 06/11/2018   Vitamin D  deficiency 06/11/2018   Multinodular goiter 06/11/2018   DM type 2 causing vascular disease (HCC)    Peripheral neuropathy    Migraine    Irritable bowel syndrome    Essential hypertension    History of right mastectomy    H/O hiatal hernia    GERD (gastroesophageal reflux disease)    Family history of anesthesia complication    Other chronic pain    Type 2 diabetes mellitus with diabetic chronic kidney disease (HCC)    Lower extremity edema    Chronic constipation 06/23/2015   Chronic diastolic heart failure (HCC) 11/17/2014   Herniated nucleus pulposus, L2-3 right 02/04/2014    Class: Acute   Spondylolisthesis of lumbar region 04/30/2012    Class: Chronic   Lumbar stenosis with neurogenic claudication 04/30/2012    Class: Chronic   SYNCOPE-CAROTID SINUS 09/16/2008   OSTEOARTHRITIS 08/23/2006   DDD (degenerative disc disease), cervical 08/23/2006   DISC DISEASE, LUMBAR 08/23/2006   Headache(784.0) 08/23/2006   BREAST CANCER, HX OF 08/23/2006   RENAL CALCULUS, HX OF 08/23/2006    PCP: Jess Morita, MD   REFERRING PROVIDER: Diedra Fowler, MD  REFERRING DIAG: M54.50 (ICD-10-CM) - Low back pain, unspecified back pain laterality, unspecified chronicity, unspecified whether sciatica present  Rationale for Evaluation and Treatment: Rehabilitation  THERAPY DIAG:  Muscle weakness (generalized)  Other abnormalities of gait and mobility  Other low back pain  ONSET DATE: chronic   SUBJECTIVE:  SUBJECTIVE STATEMENT: Pt reports continued pain in a spot in the back. HEP going well. Put heating pad on back which always helps.   PERTINENT HISTORY:  Chronic LBP, hx Lumbar surgery, CKD, hx breast cancer, DM, neuropathy, osteoporosis, does have spinal cord stimulator  PAIN:  Are you having pain? Yes: NPRS scale: 2-3/10 Pain location: low back, abdomen  Pain description: achy Aggravating factors: walking/standing Relieving factors: laying down  PRECAUTIONS: None  WEIGHT BEARING RESTRICTIONS: No  FALLS:  Has patient fallen in last 6 months? No  PLOF: Independent  PATIENT GOALS: stand up and cook   OBJECTIVE: (objective measures from initial evaluation unless otherwise dated)  PATIENT SURVEYS: Modified Oswestry 30/50  5/20: 24/50  SCREENING FOR RED FLAGS: Bowel or bladder incontinence: No Spinal tumors: No Cauda equina syndrome: No Compression fracture: No Abdominal aneurysm: No  COGNITION: Overall cognitive status: Within functional limits for tasks assessed     SENSATION: WFL  POSTURE: rounded shoulders, forward head, decreased lumbar lordosis, increased thoracic kyphosis, and flexed trunk   PALPATION: TTP lumbar and thoracic paraspinals with greatest tenderness throughout lumbar spine.  LUMBAR ROM:   AROM eval 5/20  Flexion 25% limited Full  Extension 90% limited * 40% limited  Right lateral flexion 75% limited * 25% limited*  Left lateral flexion 75% limited* 40% limited  Right rotation 50% limited* 15% limited  Left rotation 50% limited* Full   (Blank rows = not tested) * = pain/symptoms  LOWER EXTREMITY ROM:     Active  Right eval Left eval  Hip flexion    Hip extension    Hip abduction    Hip adduction    Hip internal rotation    Hip external rotation    Knee flexion    Knee extension    Ankle dorsiflexion    Ankle plantarflexion    Ankle inversion    Ankle eversion     (Blank rows = not tested) * =  pain/symptoms  LOWER EXTREMITY MMT:  unable to test extension as patient unable to get into prone  MMT Right eval Left eval Right 5/20 Left 5/20  Hip flexion 4+ 4+ 4+ 4  Hip extension      Hip abduction 4- 3+ 4+ (seated) 4-(seated)  Hip adduction      Hip internal rotation      Hip external rotation      Knee flexion 4+ 4+ 4+ 4  Knee extension 4+ 4+ 4+ 4  Ankle dorsiflexion 4+ 4+ 5 4+  Ankle plantarflexion      Ankle inversion      Ankle eversion       (Blank rows = not tested) * = pain/symptoms    FUNCTIONAL TESTS:  5 times sit to stand: 27.59 seconds with UE use 08/08/23:  22.5s with UE use 5/20: 20.03s without UE use (lowered plinth)  GAIT: Distance walked: 100 feet Assistive device utilized: Single point cane Level of assistance: Modified independence Comments: labored, decreased foot clearance  TODAY'S TREATMENT:  DATE:  09/01/23 Manual: STM to lumbar paraspinals in seated leaning on plinth Seated trunk flexion stretch 3 way stretch 5 x 5 second holds each Step up 6 inch 1 x 10 Lateral step up 6 inch 1 x 10 Palof press RTB 1 x 10 Band lift RTB 1 x 10 Seated ab iso with green ball 2 x 10 x 5 second holds   08/29/23 Updated MMT Reviewed Goals 5xSTS Modified  Oswestry 24/50 Lumbar ROM update STM (seated in chair leaning over plinth) to lumbar paraspinals     08/22/23 STS 1 x 10 Step up 4 inch 2 x 10 Standing row RTB 2 x 10 Standing shoulder extension RTB 2 x 10 Palof press RTB 1 x 10 Lateral step up 4 inch 1 x 10 Standing alternating march 2 x 10 Seated shoulder flexion with ball 2 x 10  08/18/23 Self STM with tennis ball to lower lumbar paraspinals Standing hip abduction RTB at knees 3 x 10 Standing hip extension RTB at knees 2 x 10 Seated trunk flexion stretch 3 way stretch 5 x 5 second holds each Step up 4 inch 1 x  10 Lateral step up 4 inch 1 x 10  [facility] Adult PT Treatment:                                                DATE: 08/15/23 NuStep LE/UE, L4: 6 min At  counter:  Side stepping x 2laps  Heel/toe raises x 15 Trunk ext to neutral Backward gait / forward march with tactile cues for more vertical trunk; "L" stretch at sink x 2; return to backward gait/ forward marching x 2 lap Bilat arm overhead reach with ball above eye level x10, x10each diagonal Leaning on counter, wag the tail Hip abdct/ addct x 10 each LE  Forward 4" step ups x 5 each LE  [facility] Adult PT Treatment:                                                DATE: 08/11/23 NuStep LE/UE, L5: 6 min   Standing shoulder ext with red band x 10;  bilat shoulder row with red band x 10  Seated ball roll stretch on table for lumbar flexion 5" x10 STS with forward arm reach x 10  (slightly elevated plinth) At rail:  Side stepping x2laps  Single arm overhead reach with ball above eye level x10each arm straight up, x10each diagonal Standing march 2x10 HR/TR x20 (with core activation) Forward step ups 4" with opp arm on rail (x 10 LLE, x 10 RLE) with cues for core engagement Seated Shoulder rolls x15 Seated 2# in/out from chest x 15 Seated marching 2x10ea Seated LAQ 2x10ea Gait in hall with SPC x1 lap   [facility] Adult PT Treatment:                                                DATE: 08/08/23 NuStep LE, L5: 6 min  (~45-60 SPM) Standing shoulder ext with red band x 10;  bilat shoulder row with red band x 10  "L "  stretch at sink x 2; trunk ext to neutral STS (timed) then STS with forward arm reach x 10 total At counter:  Side stepping R/L 94ft x 3 Single arm overhead reach with ball shelf above eye level x 5-7 each arm High knee Marching Forward step ups 4" with opp arm on counter (x 8 LLE, x 5 RLE) with cues for core engagement Seated Shoulder rolls Seated 2# in/out from chest x 10     PATIENT EDUCATION:  Education details: exercise  rationale, progression/modification  Person educated: Patient Education method: Programmer, multimedia, Demonstration, Education comprehension: verbalized understanding, returned demonstration, verbal cues required, and tactile cues required  HOME EXERCISE PROGRAM: Access Code: [facility] URL: [email or web address]   ASSESSMENT:  CLINICAL IMPRESSION: Continued with STM to lumbar paraspinals with some reduction in tissue tension. Tolerates increased height step today with functional strengthening. Some cueing required intermittently for mechanics and sequencing. Patient will continue to benefit from physical therapy in order to improve function and reduce impairment.    OBJECTIVE IMPAIRMENTS: Abnormal gait, decreased activity tolerance, decreased balance, decreased endurance, decreased mobility, difficulty walking, decreased ROM, decreased strength, increased muscle spasms, impaired flexibility, impaired sensation, improper body mechanics, postural dysfunction, and pain.   ACTIVITY LIMITATIONS: carrying, lifting, bending, sitting, standing, squatting, stairs, transfers, reach over head, hygiene/grooming, locomotion level, and caring for others  PARTICIPATION LIMITATIONS: meal prep, cleaning, laundry, shopping, community activity, and yard work  PERSONAL FACTORS: Fitness, Time since onset of injury/illness/exacerbation, and 3+ comorbidities: Chronic LBP, hx Lumbar surgery, CKD, hx breast cancer, DM, neuropathy, osteoporosis  are also affecting patient's functional outcome.   REHAB POTENTIAL: Good  CLINICAL DECISION MAKING: Evolving/moderate complexity  EVALUATION COMPLEXITY: Moderate   GOALS: Goals reviewed with patient? Yes  SHORT TERM GOALS: Target date: 08/11/2023    Patient will be independent with HEP in order to improve functional outcomes. Baseline:  Goal status: MET 5/20  2.  Patient will report at least 25% improvement in symptoms for improved quality of  life. Baseline:  Goal status: MET 08/11/23   LONG TERM GOALS: Target date: 09/08/2023    Patient will report at least 75% improvement in symptoms for improved quality of life. Baseline:  Goal status: IN PROGRESS  2.  Patient will improve modified oswestry score by at least 8 points in order to indicate improved tolerance to activity. Baseline:  Goal status: IN PROGRESS (improved by 6 points 5/20)  3.  Patient will demonstrate at least 25% improvement in lumbar ROM in all restricted planes for improved ability to move trunk while completing chores and cooking. Baseline:  Goal status: MET 5/20  4.  Patient will be able to complete 5x STS in under 15 seconds in order to demonstrate improved functional mobility/strength Baseline: see above Goal status: In progress - 5/20  5.  Patient will demonstrate grade of 5/5 MMT grade in all tested musculature as evidence of improved strength to assist with stair ambulation and gait.   Baseline:  Goal status: IN PROGRESS 5/20     PLAN:  PT FREQUENCY: 2x/week  PT DURATION: 8 weeks  PLANNED INTERVENTIONS: 97164- PT Re-evaluation, 97110-Therapeutic exercises, 97530- Therapeutic activity, 97112- Neuromuscular re-education, 97535- Self Care, 32440- Manual therapy, [phone]- Gait training, [phone]- Orthotic Fit/training, [phone]- Canalith repositioning, [id number]- Aquatic Therapy, [phone]- Splinting, Patient/Family education, Balance training, Stair training, Taping, Dry Needling, Joint mobilization, Joint manipulation, Spinal manipulation, Spinal mobilization, Scar mobilization, and DME instructions.  PLAN FOR NEXT SESSION: f/u with HEP, spinal mobility, core and glute and LE strength, functional strength  Perfecto Bracket Eugune Sine, PT 09/01/2023, 11:04 AM  [facility] [address] [place], [place], [address] Phone: [phone]   Fax:  [phone]     Referring diagnosis? M54.50 (ICD-10-CM) - Low back  pain, unspecified back pain laterality, unspecified chronicity, unspecified whether sciatica present Treatment diagnosis? (if different than referring diagnosis) m54.59 What was this (referring dx) caused by? []  Surgery []  Fall [x]  Ongoing issue []  Arthritis []  Other: ____________  Laterality: []  Rt []  Lt [x]  Both  Check all possible CPT codes:  *CHOOSE 10 OR LESS*    See Planned Interventions listed in the Plan section of the Evaluation.

## 2023-09-05 ENCOUNTER — Ambulatory Visit (HOSPITAL_BASED_OUTPATIENT_CLINIC_OR_DEPARTMENT_OTHER): Admitting: Physical Therapy

## 2023-09-05 ENCOUNTER — Encounter (HOSPITAL_BASED_OUTPATIENT_CLINIC_OR_DEPARTMENT_OTHER): Payer: Self-pay | Admitting: Physical Therapy

## 2023-09-05 DIAGNOSIS — M6281 Muscle weakness (generalized): Secondary | ICD-10-CM

## 2023-09-05 DIAGNOSIS — R2689 Other abnormalities of gait and mobility: Secondary | ICD-10-CM

## 2023-09-05 DIAGNOSIS — M5459 Other low back pain: Secondary | ICD-10-CM

## 2023-09-05 NOTE — Therapy (Signed)
 OUTPATIENT PHYSICAL THERAPY THORACOLUMBAR PROGRESS NOTE       Patient Name: [name] MRN: [id number] DOB:07/16/1950, 73 y.o., female Today's Date: 09/05/2023  END OF SESSION:  PT End of Session - 09/05/23 1102     Visit Number 12    Number of Visits 28    Date for PT Re-Evaluation 10/11/23    Authorization Type [employer] MEDICARE    Progress Note Due on Visit 20    PT Start Time 1102    PT Stop Time 1140    PT Time Calculation (min) 38 min    Activity Tolerance Patient tolerated treatment well    Behavior During Therapy [facility] for tasks assessed/performed                 Past Medical History:  Diagnosis Date   Anginal pain (HCC) 03/19/2012   saw Dr. Floria Hurst .. she thinks its more related to stomach issues   Arthritis    "all over; mostly in my back" (03/20/2012)   Breast cancer (HCC)    "right" (03/20/2012)   Cardiomyopathy    Chest pain 06/2005   Hospitalized, dystolic dysfunction   Chronic lower back pain    "have 2 herniated discs; going to have to have a fusion" (03/20/2012)   CKD (chronic kidney disease), stage III (HCC)    Family history of anesthesia complication    "father has nausea and vomiting"   Gallstones    GERD (gastroesophageal reflux disease)    H/O hiatal hernia    History of kidney stones    History of right mastectomy    Hypertension    "patient states never had HTN, takes Hyzaar for heart   Hypothyroidism    Irritable bowel syndrome    Migraines    "it's been a long time since I've had one" (03/20/2012)   Osteoarthritis    Peripheral neuropathy    PONV (postoperative nausea and vomiting)    Sciatica    Sinus bradycardia    Sleep apnea    mild, no CPAP   Type II diabetes mellitus (HCC)    Past Surgical History:  Procedure Laterality Date   ABDOMINAL HYSTERECTOMY  1993   adenosin cardiolite   07/2005   (+) wall motion abnormality   AXILLARY LYMPH NODE DISSECTION  2003   squamous cell cancer   BACK SURGERY     CARDIAC  CATHETERIZATION  ? 2005 / 2007   CHOLECYSTECTOMY N/A 01/24/2018   Procedure: LAPAROSCOPIC CHOLECYSTECTOMY;  Surgeon: Sim Dryer, MD;  Location: [facility] OR;  Service: General;  Laterality: N/A;   CT abdomen and pelvis  02/2010   Same   ESOPHAGOGASTRODUODENOSCOPY  2005   GERD   KNEE ARTHROSCOPY  1990's   "right" (03/20/2012)   LUMBAR FUSION N/A 08/22/2014   Procedure: Right L2-3 and right L1-2 transforaminal lumbar interbody fusion with pedicle screws, rods, sleeves, cages, local bone graft, Vivigen, cancellous chips;  Surgeon: Alphonso Jean, MD;  Location: [facility] OR;  Service: Orthopedics;  Laterality: N/A;   LUMBAR LAMINECTOMY/DECOMPRESSION MICRODISCECTOMY N/A 02/04/2014   Procedure: RIGHT L2-3 MICRODISCECTOMY ;  Surgeon: Alphonso Jean, MD;  Location: [facility] OR;  Service: Orthopedics;  Laterality: N/A;   MASTECTOMY WITH AXILLARY LYMPH NODE DISSECTION  12/2003   "right" (03/20/2012)   PARATHYROIDECTOMY Left 10/11/2022   Procedure: MINIMALLY INVASIVE LEFT INFERIOR PARATHYROIDECTOMY;  Surgeon: Shela Derby, MD;  Location: [facility] OR;  Service: General;  Laterality: Left;   POSTERIOR CERVICAL FUSION/FORAMINOTOMY  2001   SHOULDER ARTHROSCOPY W/ ROTATOR  CUFF REPAIR  2003   Left   THYROIDECTOMY  1977   Right   TOTAL KNEE ARTHROPLASTY  2000   Right   US  of abdomen  07/2005   Fatty liver / kidney stones   Patient Active Problem List   Diagnosis Date Noted   Urinary incontinence 08/09/2023   Metallic taste 05/11/2023   S/P parathyroidectomy 10/11/2022   Allergic rhinitis 07/25/2022   Syncope 07/21/2022   Sinus bradycardia 07/21/2022   Hammer toe of second toe of left foot 05/05/2021   Corns and callosities 05/05/2021   Hyperlipidemia associated with type 2 diabetes mellitus (HCC) 02/01/2021   Depression 02/01/2021   Localized osteoporosis without current pathological fracture 05/01/2020   Hyperparathyroidism (HCC) 12/13/2019   Elevated hemoglobin (HCC) 12/13/2019   Chronic renal impairment,  stage 4 (severe) (HCC) 10/28/2019   Anemia 10/28/2019   Physical exam 06/26/2019   Neck pain 04/18/2019   Ascending aortic aneurysm (HCC) 04/17/2019   Nonspecific chest pain 04/15/2019   Neuropathic pain of right flank 12/27/2018   Dysphagia 12/27/2018   Postsurgical hypothyroidism 06/11/2018   Vitamin D  deficiency 06/11/2018   Multinodular goiter 06/11/2018   DM type 2 causing vascular disease (HCC)    Peripheral neuropathy    Migraine    Irritable bowel syndrome    Essential hypertension    History of right mastectomy    H/O hiatal hernia    GERD (gastroesophageal reflux disease)    Family history of anesthesia complication    Other chronic pain    Type 2 diabetes mellitus with diabetic chronic kidney disease (HCC)    Lower extremity edema    Chronic constipation 06/23/2015   Chronic diastolic heart failure (HCC) 11/17/2014   Herniated nucleus pulposus, L2-3 right 02/04/2014    Class: Acute   Spondylolisthesis of lumbar region 04/30/2012    Class: Chronic   Lumbar stenosis with neurogenic claudication 04/30/2012    Class: Chronic   SYNCOPE-CAROTID SINUS 09/16/2008   OSTEOARTHRITIS 08/23/2006   DDD (degenerative disc disease), cervical 08/23/2006   DISC DISEASE, LUMBAR 08/23/2006   Headache(784.0) 08/23/2006   BREAST CANCER, HX OF 08/23/2006   RENAL CALCULUS, HX OF 08/23/2006    PCP: Jess Morita, MD   REFERRING PROVIDER: Diedra Fowler, MD  REFERRING DIAG: M54.50 (ICD-10-CM) - Low back pain, unspecified back pain laterality, unspecified chronicity, unspecified whether sciatica present  Rationale for Evaluation and Treatment: Rehabilitation  THERAPY DIAG:  Muscle weakness (generalized)  Other abnormalities of gait and mobility  Other low back pain  ONSET DATE: chronic   SUBJECTIVE:  SUBJECTIVE STATEMENT: Pt reports that spot is a little bit better. STM helps decrease symptoms but it doesn't last long. Continues to feel off balance. Did fairly well with shopping on Friday.   PERTINENT HISTORY:  Chronic LBP, hx Lumbar surgery, CKD, hx breast cancer, DM, neuropathy, osteoporosis, does have spinal cord stimulator  PAIN:  Are you having pain? Yes: NPRS scale: 4/10 Pain location: low back, abdomen  Pain description: achy Aggravating factors: walking/standing Relieving factors: laying down  PRECAUTIONS: None  WEIGHT BEARING RESTRICTIONS: No  FALLS:  Has patient fallen in last 6 months? No  PLOF: Independent  PATIENT GOALS: stand up and cook   OBJECTIVE: (objective measures from initial evaluation unless otherwise dated)  PATIENT SURVEYS: Modified Oswestry 30/50  5/20: 24/50  SCREENING FOR RED FLAGS: Bowel or bladder incontinence: No Spinal tumors: No Cauda equina syndrome: No Compression fracture: No Abdominal aneurysm: No  COGNITION: Overall cognitive status: Within functional limits for tasks assessed     SENSATION: WFL  POSTURE: rounded shoulders, forward head, decreased lumbar lordosis, increased thoracic kyphosis, and flexed trunk   PALPATION: TTP lumbar and thoracic paraspinals with greatest tenderness throughout lumbar spine.  LUMBAR ROM:   AROM eval 5/20  Flexion 25% limited Full  Extension 90% limited * 40% limited  Right lateral flexion 75% limited * 25% limited*  Left lateral flexion 75% limited* 40% limited  Right rotation 50% limited* 15% limited  Left rotation 50% limited* Full   (Blank rows = not tested) * = pain/symptoms  LOWER EXTREMITY ROM:     Active  Right eval Left eval  Hip flexion    Hip extension    Hip abduction    Hip adduction    Hip internal rotation    Hip external rotation    Knee flexion    Knee extension    Ankle dorsiflexion    Ankle plantarflexion    Ankle inversion    Ankle  eversion     (Blank rows = not tested) * = pain/symptoms  LOWER EXTREMITY MMT:  unable to test extension as patient unable to get into prone  MMT Right eval Left eval Right 5/20 Left 5/20  Hip flexion 4+ 4+ 4+ 4  Hip extension      Hip abduction 4- 3+ 4+ (seated) 4-(seated)  Hip adduction      Hip internal rotation      Hip external rotation      Knee flexion 4+ 4+ 4+ 4  Knee extension 4+ 4+ 4+ 4  Ankle dorsiflexion 4+ 4+ 5 4+  Ankle plantarflexion      Ankle inversion      Ankle eversion       (Blank rows = not tested) * = pain/symptoms    FUNCTIONAL TESTS:  5 times sit to stand: 27.59 seconds with UE use 08/08/23:  22.5s with UE use 5/20: 20.03s without UE use (lowered plinth)  GAIT: Distance walked: 100 feet Assistive device utilized: Single point cane Level of assistance: Modified independence Comments: labored, decreased foot clearance  TODAY'S TREATMENT:  DATE:  09/05/23 Manual: STM to lumbar paraspinals in seated leaning on plinth Step up 6 inch 1 x 10 Lateral step up 6 inch 1 x 10 Standing hip extension RTB at knees 3 x 10 Seated ab iso with green ball 2 x 10 x 5 second holds  09/01/23 Manual: STM to lumbar paraspinals in seated leaning on plinth Seated trunk flexion stretch 3 way stretch 5 x 5 second holds each Step up 6 inch 1 x 10 Lateral step up 6 inch 1 x 10 Palof press RTB 1 x 10 Band lift RTB 1 x 10 Seated ab iso with green ball 2 x 10 x 5 second holds   08/29/23 Updated MMT Reviewed Goals 5xSTS Modified  Oswestry 24/50 Lumbar ROM update STM (seated in chair leaning over plinth) to lumbar paraspinals     08/22/23 STS 1 x 10 Step up 4 inch 2 x 10 Standing row RTB 2 x 10 Standing shoulder extension RTB 2 x 10 Palof press RTB 1 x 10 Lateral step up 4 inch 1 x 10 Standing alternating march 2 x 10 Seated shoulder  flexion with ball 2 x 10  08/18/23 Self STM with tennis ball to lower lumbar paraspinals Standing hip abduction RTB at knees 3 x 10 Standing hip extension RTB at knees 2 x 10 Seated trunk flexion stretch 3 way stretch 5 x 5 second holds each Step up 4 inch 1 x 10 Lateral step up 4 inch 1 x 10  [facility] Adult PT Treatment:                                                DATE: 08/15/23 NuStep LE/UE, L4: 6 min At  counter:  Side stepping x 2laps  Heel/toe raises x 15 Trunk ext to neutral Backward gait / forward march with tactile cues for more vertical trunk; "L" stretch at sink x 2; return to backward gait/ forward marching x 2 lap Bilat arm overhead reach with ball above eye level x10, x10each diagonal Leaning on counter, wag the tail Hip abdct/ addct x 10 each LE  Forward 4" step ups x 5 each LE  [facility] Adult PT Treatment:                                                DATE: 08/11/23 NuStep LE/UE, L5: 6 min   Standing shoulder ext with red band x 10;  bilat shoulder row with red band x 10  Seated ball roll stretch on table for lumbar flexion 5" x10 STS with forward arm reach x 10  (slightly elevated plinth) At rail:  Side stepping x2laps  Single arm overhead reach with ball above eye level x10each arm straight up, x10each diagonal Standing march 2x10 HR/TR x20 (with core activation) Forward step ups 4" with opp arm on rail (x 10 LLE, x 10 RLE) with cues for core engagement Seated Shoulder rolls x15 Seated 2# in/out from chest x 15 Seated marching 2x10ea Seated LAQ 2x10ea Gait in hall with [facility] x1 lap   [facility] Adult PT Treatment:  DATE: 08/08/23 NuStep LE, L5: 6 min  (~45-60 SPM) Standing shoulder ext with red band x 10;  bilat shoulder row with red band x 10  "L " stretch at sink x 2; trunk ext to neutral STS (timed) then STS with forward arm reach x 10 total At counter:  Side stepping R/L 30ft x 3 Single arm overhead reach with ball shelf  above eye level x 5-7 each arm High knee Marching Forward step ups 4" with opp arm on counter (x 8 LLE, x 5 RLE) with cues for core engagement Seated Shoulder rolls Seated 2# in/out from chest x 10     PATIENT EDUCATION:  Education details: exercise rationale, progression/modification  Person educated: Patient Education method: Programmer, multimedia, Demonstration, Education comprehension: verbalized understanding, returned demonstration, verbal cues required, and tactile cues required  HOME EXERCISE PROGRAM: Access Code: [facility] URL: [email or web address]   ASSESSMENT:  CLINICAL IMPRESSION: Patient with continued tenderness at lower left lumbar paraspinals. Continued with STM to lumbar paraspinals with some reduction in tissue tension. Continued with functional and core strengthening. Some cueing provided for posture and mechanics throughout session. Patient will continue to benefit from physical therapy in order to improve function and reduce impairment.    OBJECTIVE IMPAIRMENTS: Abnormal gait, decreased activity tolerance, decreased balance, decreased endurance, decreased mobility, difficulty walking, decreased ROM, decreased strength, increased muscle spasms, impaired flexibility, impaired sensation, improper body mechanics, postural dysfunction, and pain.   ACTIVITY LIMITATIONS: carrying, lifting, bending, sitting, standing, squatting, stairs, transfers, reach over head, hygiene/grooming, locomotion level, and caring for others  PARTICIPATION LIMITATIONS: meal prep, cleaning, laundry, shopping, community activity, and yard work  PERSONAL FACTORS: Fitness, Time since onset of injury/illness/exacerbation, and 3+ comorbidities: Chronic LBP, hx Lumbar surgery, CKD, hx breast cancer, DM, neuropathy, osteoporosis  are also affecting patient's functional outcome.   REHAB POTENTIAL: Good  CLINICAL DECISION MAKING: Evolving/moderate complexity  EVALUATION COMPLEXITY:  Moderate   GOALS: Goals reviewed with patient? Yes  SHORT TERM GOALS: Target date: 08/11/2023    Patient will be independent with HEP in order to improve functional outcomes. Baseline:  Goal status: MET 5/20  2.  Patient will report at least 25% improvement in symptoms for improved quality of life. Baseline:  Goal status: MET 08/11/23   LONG TERM GOALS: Target date: 09/08/2023    Patient will report at least 75% improvement in symptoms for improved quality of life. Baseline:  Goal status: IN PROGRESS  2.  Patient will improve modified oswestry score by at least 8 points in order to indicate improved tolerance to activity. Baseline:  Goal status: IN PROGRESS (improved by 6 points 5/20)  3.  Patient will demonstrate at least 25% improvement in lumbar ROM in all restricted planes for improved ability to move trunk while completing chores and cooking. Baseline:  Goal status: MET 5/20  4.  Patient will be able to complete 5x STS in under 15 seconds in order to demonstrate improved functional mobility/strength Baseline: see above Goal status: In progress - 5/20  5.  Patient will demonstrate grade of 5/5 MMT grade in all tested musculature as evidence of improved strength to assist with stair ambulation and gait.   Baseline:  Goal status: IN PROGRESS 5/20     PLAN:  PT FREQUENCY: 2x/week  PT DURATION: 8 weeks  PLANNED INTERVENTIONS: 97164- PT Re-evaluation, 97110-Therapeutic exercises, 97530- Therapeutic activity, 97112- Neuromuscular re-education, 97535- Self Care, 29562- Manual therapy, [phone]- Gait training, [phone]- Orthotic Fit/training, [phone]- Canalith repositioning, [id number]- Aquatic Therapy, [phone]- Splinting, Patient/Family education, Balance  training, Stair training, Taping, Dry Needling, Joint mobilization, Joint manipulation, Spinal manipulation, Spinal mobilization, Scar mobilization, and DME instructions.  PLAN FOR NEXT SESSION: f/u with HEP, spinal mobility, core and  glute and LE strength, functional strength   Beather Liming, PT 09/05/2023, 11:39 AM  [facility] [address] [place], [place], [address] Phone: [phone]   Fax:  [phone]     Referring diagnosis? M54.50 (ICD-10-CM) - Low back pain, unspecified back pain laterality, unspecified chronicity, unspecified whether sciatica present Treatment diagnosis? (if different than referring diagnosis) m54.59 What was this (referring dx) caused by? []  Surgery []  Fall [x]  Ongoing issue []  Arthritis []  Other: ____________  Laterality: []  Rt []  Lt [x]  Both  Check all possible CPT codes:  *CHOOSE 10 OR LESS*    See Planned Interventions listed in the Plan section of the Evaluation.

## 2023-09-08 ENCOUNTER — Ambulatory Visit (HOSPITAL_BASED_OUTPATIENT_CLINIC_OR_DEPARTMENT_OTHER): Admitting: Physical Therapy

## 2023-09-08 ENCOUNTER — Encounter (HOSPITAL_BASED_OUTPATIENT_CLINIC_OR_DEPARTMENT_OTHER): Payer: Self-pay | Admitting: Physical Therapy

## 2023-09-08 DIAGNOSIS — M5459 Other low back pain: Secondary | ICD-10-CM

## 2023-09-08 DIAGNOSIS — M6281 Muscle weakness (generalized): Secondary | ICD-10-CM

## 2023-09-08 DIAGNOSIS — R2689 Other abnormalities of gait and mobility: Secondary | ICD-10-CM

## 2023-09-08 NOTE — Therapy (Signed)
 OUTPATIENT PHYSICAL THERAPY THORACOLUMBAR PROGRESS NOTE       Patient Name: [name] MRN: [id number] DOB:October 12, 1950, 73 y.o., female Today's Date: 09/08/2023  END OF SESSION:  PT End of Session - 09/08/23 1103     Visit Number 13    Number of Visits 28    Date for PT Re-Evaluation 10/11/23    Authorization Type [employer]ICARE    Progress Note Due on Visit 20    PT Start Time 1103    PT Stop Time 1143    PT Time Calculation (min) 40 min    Activity Tolerance Patient tolerated treatment well    Behavior During Therapy [facility] for tasks assessed/performed                 Past Medical History:  Diagnosis Date   Anginal pain (HCC) 03/19/2012   saw Dr. Floria Hurst .. she thinks its more related to stomach issues   Arthritis    "all over; mostly in my back" (03/20/2012)   Breast cancer (HCC)    "right" (03/20/2012)   Cardiomyopathy    Chest pain 06/2005   Hospitalized, dystolic dysfunction   Chronic lower back pain    "have 2 herniated discs; going to have to have a fusion" (03/20/2012)   CKD (chronic kidney disease), stage III (HCC)    Family history of anesthesia complication    "father has nausea and vomiting"   Gallstones    GERD (gastroesophageal reflux disease)    H/O hiatal hernia    History of kidney stones    History of right mastectomy    Hypertension    "patient states never had HTN, takes Hyzaar for heart   Hypothyroidism    Irritable bowel syndrome    Migraines    "it's been a long time since I've had one" (03/20/2012)   Osteoarthritis    Peripheral neuropathy    PONV (postoperative nausea and vomiting)    Sciatica    Sinus bradycardia    Sleep apnea    mild, no CPAP   Type II diabetes mellitus (HCC)    Past Surgical History:  Procedure Laterality Date   ABDOMINAL HYSTERECTOMY  1993   adenosin cardiolite   07/2005   (+) wall motion abnormality   AXILLARY LYMPH NODE DISSECTION  2003   squamous cell cancer   BACK SURGERY     CARDIAC  CATHETERIZATION  ? 2005 / 2007   CHOLECYSTECTOMY N/A 01/24/2018   Procedure: LAPAROSCOPIC CHOLECYSTECTOMY;  Surgeon: Sim Dryer, MD;  Location: [facility] OR;  Service: General;  Laterality: N/A;   CT abdomen and pelvis  02/2010   Same   ESOPHAGOGASTRODUODENOSCOPY  2005   GERD   KNEE ARTHROSCOPY  1990's   "right" (03/20/2012)   LUMBAR FUSION N/A 08/22/2014   Procedure: Right L2-3 and right L1-2 transforaminal lumbar interbody fusion with pedicle screws, rods, sleeves, cages, local bone graft, Vivigen, cancellous chips;  Surgeon: Alphonso Jean, MD;  Location: [facility] OR;  Service: Orthopedics;  Laterality: N/A;   LUMBAR LAMINECTOMY/DECOMPRESSION MICRODISCECTOMY N/A 02/04/2014   Procedure: RIGHT L2-3 MICRODISCECTOMY ;  Surgeon: Alphonso Jean, MD;  Location: [facility] OR;  Service: Orthopedics;  Laterality: N/A;   MASTECTOMY WITH AXILLARY LYMPH NODE DISSECTION  12/2003   "right" (03/20/2012)   PARATHYROIDECTOMY Left 10/11/2022   Procedure: MINIMALLY INVASIVE LEFT INFERIOR PARATHYROIDECTOMY;  Surgeon: Shela Derby, MD;  Location: [facility] OR;  Service: General;  Laterality: Left;   POSTERIOR CERVICAL FUSION/FORAMINOTOMY  2001   SHOULDER ARTHROSCOPY W/ ROTATOR  CUFF REPAIR  2003   Left   THYROIDECTOMY  1977   Right   TOTAL KNEE ARTHROPLASTY  2000   Right   US  of abdomen  07/2005   Fatty liver / kidney stones   Patient Active Problem List   Diagnosis Date Noted   Urinary incontinence 08/09/2023   Metallic taste 05/11/2023   S/P parathyroidectomy 10/11/2022   Allergic rhinitis 07/25/2022   Syncope 07/21/2022   Sinus bradycardia 07/21/2022   Hammer toe of second toe of left foot 05/05/2021   Corns and callosities 05/05/2021   Hyperlipidemia associated with type 2 diabetes mellitus (HCC) 02/01/2021   Depression 02/01/2021   Localized osteoporosis without current pathological fracture 05/01/2020   Hyperparathyroidism (HCC) 12/13/2019   Elevated hemoglobin (HCC) 12/13/2019   Chronic renal impairment,  stage 4 (severe) (HCC) 10/28/2019   Anemia 10/28/2019   Physical exam 06/26/2019   Neck pain 04/18/2019   Ascending aortic aneurysm (HCC) 04/17/2019   Nonspecific chest pain 04/15/2019   Neuropathic pain of right flank 12/27/2018   Dysphagia 12/27/2018   Postsurgical hypothyroidism 06/11/2018   Vitamin D  deficiency 06/11/2018   Multinodular goiter 06/11/2018   DM type 2 causing vascular disease (HCC)    Peripheral neuropathy    Migraine    Irritable bowel syndrome    Essential hypertension    History of right mastectomy    H/O hiatal hernia    GERD (gastroesophageal reflux disease)    Family history of anesthesia complication    Other chronic pain    Type 2 diabetes mellitus with diabetic chronic kidney disease (HCC)    Lower extremity edema    Chronic constipation 06/23/2015   Chronic diastolic heart failure (HCC) 11/17/2014   Herniated nucleus pulposus, L2-3 right 02/04/2014    Class: Acute   Spondylolisthesis of lumbar region 04/30/2012    Class: Chronic   Lumbar stenosis with neurogenic claudication 04/30/2012    Class: Chronic   SYNCOPE-CAROTID SINUS 09/16/2008   OSTEOARTHRITIS 08/23/2006   DDD (degenerative disc disease), cervical 08/23/2006   DISC DISEASE, LUMBAR 08/23/2006   Headache(784.0) 08/23/2006   BREAST CANCER, HX OF 08/23/2006   RENAL CALCULUS, HX OF 08/23/2006    PCP: Jess Morita, MD   REFERRING PROVIDER: Diedra Fowler, MD  REFERRING DIAG: M54.50 (ICD-10-CM) - Low back pain, unspecified back pain laterality, unspecified chronicity, unspecified whether sciatica present  Rationale for Evaluation and Treatment: Rehabilitation  THERAPY DIAG:  Muscle weakness (generalized)  Other abnormalities of gait and mobility  Other low back pain  ONSET DATE: chronic   SUBJECTIVE:  SUBJECTIVE STATEMENT: Pt reports continued symptoms in back. She feels like she is moving better. Her family thinks she is walking better.   PERTINENT HISTORY:  Chronic LBP, hx Lumbar surgery, CKD, hx breast cancer, DM, neuropathy, osteoporosis, does have spinal cord stimulator  PAIN:  Are you having pain? Yes: NPRS scale: 4/10 Pain location: low back, abdomen  Pain description: achy Aggravating factors: walking/standing Relieving factors: laying down  PRECAUTIONS: None  WEIGHT BEARING RESTRICTIONS: No  FALLS:  Has patient fallen in last 6 months? No  PLOF: Independent  PATIENT GOALS: stand up and cook   OBJECTIVE: (objective measures from initial evaluation unless otherwise dated)  PATIENT SURVEYS: Modified Oswestry 30/50  5/20: 24/50  SCREENING FOR RED FLAGS: Bowel or bladder incontinence: No Spinal tumors: No Cauda equina syndrome: No Compression fracture: No Abdominal aneurysm: No  COGNITION: Overall cognitive status: Within functional limits for tasks assessed     SENSATION: WFL  POSTURE: rounded shoulders, forward head, decreased lumbar lordosis, increased thoracic kyphosis, and flexed trunk   PALPATION: TTP lumbar and thoracic paraspinals with greatest tenderness throughout lumbar spine.  LUMBAR ROM:   AROM eval 5/20  Flexion 25% limited Full  Extension 90% limited * 40% limited  Right lateral flexion 75% limited * 25% limited*  Left lateral flexion 75% limited* 40% limited  Right rotation 50% limited* 15% limited  Left rotation 50% limited* Full   (Blank rows = not tested) * = pain/symptoms  LOWER EXTREMITY ROM:     Active  Right eval Left eval  Hip flexion    Hip extension    Hip abduction    Hip adduction    Hip internal rotation    Hip external rotation    Knee flexion    Knee extension    Ankle dorsiflexion    Ankle plantarflexion    Ankle inversion    Ankle eversion     (Blank rows = not tested) * =  pain/symptoms  LOWER EXTREMITY MMT:  unable to test extension as patient unable to get into prone  MMT Right eval Left eval Right 5/20 Left 5/20  Hip flexion 4+ 4+ 4+ 4  Hip extension      Hip abduction 4- 3+ 4+ (seated) 4-(seated)  Hip adduction      Hip internal rotation      Hip external rotation      Knee flexion 4+ 4+ 4+ 4  Knee extension 4+ 4+ 4+ 4  Ankle dorsiflexion 4+ 4+ 5 4+  Ankle plantarflexion      Ankle inversion      Ankle eversion       (Blank rows = not tested) * = pain/symptoms    FUNCTIONAL TESTS:  5 times sit to stand: 27.59 seconds with UE use 08/08/23:  22.5s with UE use 5/20: 20.03s without UE use (lowered plinth)  GAIT: Distance walked: 100 feet Assistive device utilized: Single point cane Level of assistance: Modified independence Comments: labored, decreased foot clearance  TODAY'S TREATMENT:  DATE:  09/08/23 Step up 6 inch 1 x 10 Lateral step up 6 inch 1 x 10 Standing 1 foot on step with overhead reach 2 x 10  Standing wall slides with lift off 2 x 10 Seated ab iso with green ball 2 x 10 x 5 second holds Seated trunk flexion stretch 3 way stretch 10 x 5 second holds each Standing alternating march 2 x 10  STS with overhead reach 1 x 10   09/05/23 Manual: STM to lumbar paraspinals in seated leaning on plinth Step up 6 inch 1 x 10 Lateral step up 6 inch 1 x 10 Standing hip extension RTB at knees 3 x 10 Seated ab iso with green ball 2 x 10 x 5 second holds  09/01/23 Manual: STM to lumbar paraspinals in seated leaning on plinth Seated trunk flexion stretch 3 way stretch 5 x 5 second holds each Step up 6 inch 1 x 10 Lateral step up 6 inch 1 x 10 Palof press RTB 1 x 10 Band lift RTB 1 x 10 Seated ab iso with green ball 2 x 10 x 5 second holds   08/29/23 Updated MMT Reviewed Goals 5xSTS Modified  Oswestry  24/50 Lumbar ROM update STM (seated in chair leaning over plinth) to lumbar paraspinals     08/22/23 STS 1 x 10 Step up 4 inch 2 x 10 Standing row RTB 2 x 10 Standing shoulder extension RTB 2 x 10 Palof press RTB 1 x 10 Lateral step up 4 inch 1 x 10 Standing alternating march 2 x 10 Seated shoulder flexion with ball 2 x 10  08/18/23 Self STM with tennis ball to lower lumbar paraspinals Standing hip abduction RTB at knees 3 x 10 Standing hip extension RTB at knees 2 x 10 Seated trunk flexion stretch 3 way stretch 5 x 5 second holds each Step up 4 inch 1 x 10 Lateral step up 4 inch 1 x 10      PATIENT EDUCATION:  Education details: exercise rationale, progression/modification  Person educated: Patient Education method: Programmer, multimedia, Demonstration, Education comprehension: verbalized understanding, returned demonstration, verbal cues required, and tactile cues required  HOME EXERCISE PROGRAM: Access Code: [facility] URL: [email or web address]   ASSESSMENT:  CLINICAL IMPRESSION: Continued with glute/LE/ core/postural and functional strengthening. Patient will continue to benefit from physical therapy in order to improve function and reduce impairment.    OBJECTIVE IMPAIRMENTS: Abnormal gait, decreased activity tolerance, decreased balance, decreased endurance, decreased mobility, difficulty walking, decreased ROM, decreased strength, increased muscle spasms, impaired flexibility, impaired sensation, improper body mechanics, postural dysfunction, and pain.   ACTIVITY LIMITATIONS: carrying, lifting, bending, sitting, standing, squatting, stairs, transfers, reach over head, hygiene/grooming, locomotion level, and caring for others  PARTICIPATION LIMITATIONS: meal prep, cleaning, laundry, shopping, community activity, and yard work  PERSONAL FACTORS: Fitness, Time since onset of injury/illness/exacerbation, and 3+ comorbidities: Chronic LBP, hx Lumbar surgery, CKD,  hx breast cancer, DM, neuropathy, osteoporosis  are also affecting patient's functional outcome.   REHAB POTENTIAL: Good  CLINICAL DECISION MAKING: Evolving/moderate complexity  EVALUATION COMPLEXITY: Moderate   GOALS: Goals reviewed with patient? Yes  SHORT TERM GOALS: Target date: 08/11/2023    Patient will be independent with HEP in order to improve functional outcomes. Baseline:  Goal status: MET 5/20  2.  Patient will report at least 25% improvement in symptoms for improved quality of life. Baseline:  Goal status: MET 08/11/23   LONG TERM GOALS: Target date: 09/08/2023    Patient will report at least 75%  improvement in symptoms for improved quality of life. Baseline:  Goal status: IN PROGRESS  2.  Patient will improve modified oswestry score by at least 8 points in order to indicate improved tolerance to activity. Baseline:  Goal status: IN PROGRESS (improved by 6 points 5/20)  3.  Patient will demonstrate at least 25% improvement in lumbar ROM in all restricted planes for improved ability to move trunk while completing chores and cooking. Baseline:  Goal status: MET 5/20  4.  Patient will be able to complete 5x STS in under 15 seconds in order to demonstrate improved functional mobility/strength Baseline: see above Goal status: In progress - 5/20  5.  Patient will demonstrate grade of 5/5 MMT grade in all tested musculature as evidence of improved strength to assist with stair ambulation and gait.   Baseline:  Goal status: IN PROGRESS 5/20     PLAN:  PT FREQUENCY: 2x/week  PT DURATION: 8 weeks  PLANNED INTERVENTIONS: 97164- PT Re-evaluation, 97110-Therapeutic exercises, 97530- Therapeutic activity, 97112- Neuromuscular re-education, 97535- Self Care, 16109- Manual therapy, [phone]- Gait training, [phone]- Orthotic Fit/training, [phone]- Canalith repositioning, [id number]- Aquatic Therapy, [phone]- Splinting, Patient/Family education, Balance training, Stair training,  Taping, Dry Needling, Joint mobilization, Joint manipulation, Spinal manipulation, Spinal mobilization, Scar mobilization, and DME instructions.  PLAN FOR NEXT SESSION: f/u with HEP, spinal mobility, core and glute and LE strength, functional strength   Beather Liming, PT 09/08/2023, 11:41 AM  [facility] [address] [place], [place], [address] Phone: [phone]   Fax:  [phone]     Referring diagnosis? M54.50 (ICD-10-CM) - Low back pain, unspecified back pain laterality, unspecified chronicity, unspecified whether sciatica present Treatment diagnosis? (if different than referring diagnosis) m54.59 What was this (referring dx) caused by? []  Surgery []  Fall [x]  Ongoing issue []  Arthritis []  Other: ____________  Laterality: []  Rt []  Lt [x]  Both  Check all possible CPT codes:  *CHOOSE 10 OR LESS*    See Planned Interventions listed in the Plan section of the Evaluation.

## 2023-09-18 ENCOUNTER — Telehealth: Payer: Self-pay

## 2023-09-18 NOTE — Telephone Encounter (Signed)
 Patient dropped off form for handicap parking placard placed in the providers front office file.  Patient would like to be called to pick up when this is complete  She was informed of 5-7 day turn around time.

## 2023-09-19 NOTE — Telephone Encounter (Signed)
 Tonya placed in your sign folder

## 2023-09-26 ENCOUNTER — Telehealth: Payer: Self-pay

## 2023-09-26 NOTE — Telephone Encounter (Signed)
Faxed and placed in scan folder 

## 2023-09-26 NOTE — Telephone Encounter (Signed)
 Patient is calling  to see if her handicapped sticker has been filled out

## 2023-09-26 NOTE — Telephone Encounter (Signed)
 Form completed and returned to [place]

## 2023-09-26 NOTE — Telephone Encounter (Signed)
 Pt has been called and will pick up front

## 2023-09-27 DIAGNOSIS — R809 Proteinuria, unspecified: Secondary | ICD-10-CM | POA: Diagnosis not present

## 2023-09-27 DIAGNOSIS — E2839 Other primary ovarian failure: Secondary | ICD-10-CM | POA: Diagnosis not present

## 2023-09-27 DIAGNOSIS — Z853 Personal history of malignant neoplasm of breast: Secondary | ICD-10-CM | POA: Diagnosis not present

## 2023-09-27 DIAGNOSIS — N184 Chronic kidney disease, stage 4 (severe): Secondary | ICD-10-CM | POA: Diagnosis not present

## 2023-09-27 DIAGNOSIS — I130 Hypertensive heart and chronic kidney disease with heart failure and stage 1 through stage 4 chronic kidney disease, or unspecified chronic kidney disease: Secondary | ICD-10-CM | POA: Diagnosis not present

## 2023-09-27 DIAGNOSIS — E1122 Type 2 diabetes mellitus with diabetic chronic kidney disease: Secondary | ICD-10-CM | POA: Diagnosis not present

## 2023-09-27 DIAGNOSIS — I272 Pulmonary hypertension, unspecified: Secondary | ICD-10-CM | POA: Diagnosis not present

## 2023-09-27 DIAGNOSIS — I5032 Chronic diastolic (congestive) heart failure: Secondary | ICD-10-CM | POA: Diagnosis not present

## 2023-09-27 DIAGNOSIS — M8588 Other specified disorders of bone density and structure, other site: Secondary | ICD-10-CM | POA: Diagnosis not present

## 2023-09-27 LAB — HM DEXA SCAN

## 2023-09-28 LAB — LAB REPORT - SCANNED
Creatinine, POC: 79.1 mg/dL
EGFR: 23
PTH: 50

## 2023-10-10 ENCOUNTER — Other Ambulatory Visit: Payer: Self-pay

## 2023-10-10 ENCOUNTER — Ambulatory Visit: Payer: Self-pay | Admitting: Family Medicine

## 2023-10-10 ENCOUNTER — Encounter (HOSPITAL_BASED_OUTPATIENT_CLINIC_OR_DEPARTMENT_OTHER): Payer: Self-pay | Admitting: Physical Therapy

## 2023-10-10 ENCOUNTER — Ambulatory Visit (HOSPITAL_BASED_OUTPATIENT_CLINIC_OR_DEPARTMENT_OTHER): Attending: Orthopedic Surgery | Admitting: Physical Therapy

## 2023-10-10 DIAGNOSIS — M6281 Muscle weakness (generalized): Secondary | ICD-10-CM | POA: Insufficient documentation

## 2023-10-10 DIAGNOSIS — M5459 Other low back pain: Secondary | ICD-10-CM | POA: Diagnosis not present

## 2023-10-10 DIAGNOSIS — R2689 Other abnormalities of gait and mobility: Secondary | ICD-10-CM | POA: Diagnosis not present

## 2023-10-10 MED ORDER — ALENDRONATE SODIUM 70 MG PO TABS: 70.0000 mg | ORAL_TABLET | ORAL | 3 refills | Status: DC

## 2023-10-10 NOTE — Telephone Encounter (Signed)
-----   Message from Comer Greet sent at 10/10/2023  7:39 AM EDT ----- Your bone density scan shows that you have osteoporosis in your R hip.  Based on this, we need to make sure you are taking daily Calcium  and Vit D and I also recommend starting Fosamax  70mg  weekly  (#12, 3 refills) to help prevent additional bone loss ----- Message ----- From: Micheal Nickels Sent: 10/02/2023   8:03 AM EDT To: Comer FORBES Greet, MD

## 2023-10-10 NOTE — Telephone Encounter (Signed)
 Pt has been notified Fosamax  has been sent in to pharmacy on file

## 2023-10-10 NOTE — Therapy (Signed)
 OUTPATIENT PHYSICAL THERAPY THORACOLUMBAR NOTE   Patient Name: [name] MRN: [id number] DOB:[birth year], 73 y.o., female Today's Date: 10/10/2023  END OF SESSION:  PT End of Session - 10/10/23 1657     Visit Number 14    Number of Visits 28    Date for PT Re-Evaluation 10/11/23    Authorization Type [employer]ICARE    Progress Note Due on Visit 20    PT Start Time 1448    PT Stop Time 1526    PT Time Calculation (min) 38 min    Activity Tolerance Patient tolerated treatment well    Behavior During Therapy [facility] for tasks assessed/performed               Past Medical History:  Diagnosis Date   Anginal pain (HCC) 03/19/2012   saw Dr. Calhoun .. she thinks its more related to stomach issues   Arthritis    all over; mostly in my back (03/20/2012)   Breast cancer (HCC)    right (03/20/2012)   Cardiomyopathy    Chest pain 06/2005   Hospitalized, dystolic dysfunction   Chronic lower back pain    have 2 herniated discs; going to have to have a fusion (03/20/2012)   CKD (chronic kidney disease), stage III (HCC)    Family history of anesthesia complication    father has nausea and vomiting   Gallstones    GERD (gastroesophageal reflux disease)    H/O hiatal hernia    History of kidney stones    History of right mastectomy    Hypertension    patient states never had HTN, takes Hyzaar for heart   Hypothyroidism    Irritable bowel syndrome    Migraines    it's been a long time since I've had one (03/20/2012)   Osteoarthritis    Peripheral neuropathy    PONV (postoperative nausea and vomiting)    Sciatica    Sinus bradycardia    Sleep apnea    mild, no CPAP   Type II diabetes mellitus (HCC)    Past Surgical History:  Procedure Laterality Date   ABDOMINAL HYSTERECTOMY  1993   adenosin cardiolite   07/2005   (+) wall motion abnormality   AXILLARY LYMPH NODE DISSECTION  2003   squamous cell cancer   BACK SURGERY     CARDIAC CATHETERIZATION  ?  2005 / 2007   CHOLECYSTECTOMY N/A 01/24/2018   Procedure: LAPAROSCOPIC CHOLECYSTECTOMY;  Surgeon: Vanderbilt Ned, MD;  Location: [facility] OR;  Service: General;  Laterality: N/A;   CT abdomen and pelvis  02/2010   Same   ESOPHAGOGASTRODUODENOSCOPY  2005   GERD   KNEE ARTHROSCOPY  1990's   right (03/20/2012)   LUMBAR FUSION N/A 08/22/2014   Procedure: Right L2-3 and right L1-2 transforaminal lumbar interbody fusion with pedicle screws, rods, sleeves, cages, local bone graft, Vivigen, cancellous chips;  Surgeon: Lynwood FORBES Better, MD;  Location: [facility] OR;  Service: Orthopedics;  Laterality: N/A;   LUMBAR LAMINECTOMY/DECOMPRESSION MICRODISCECTOMY N/A 02/04/2014   Procedure: RIGHT L2-3 MICRODISCECTOMY ;  Surgeon: Lynwood FORBES Better, MD;  Location: [facility] OR;  Service: Orthopedics;  Laterality: N/A;   MASTECTOMY WITH AXILLARY LYMPH NODE DISSECTION  12/2003   right (03/20/2012)   PARATHYROIDECTOMY Left 10/11/2022   Procedure: MINIMALLY INVASIVE LEFT INFERIOR PARATHYROIDECTOMY;  Surgeon: Rubin Calamity, MD;  Location: [facility] OR;  Service: General;  Laterality: Left;   POSTERIOR CERVICAL FUSION/FORAMINOTOMY  2001   SHOULDER ARTHROSCOPY W/ ROTATOR CUFF REPAIR  2003   Left  THYROIDECTOMY  1977   Right   TOTAL KNEE ARTHROPLASTY  2000   Right   US  of abdomen  07/2005   Fatty liver / kidney stones   Patient Active Problem List   Diagnosis Date Noted   Urinary incontinence 08/09/2023   Metallic taste 05/11/2023   S/P parathyroidectomy 10/11/2022   Allergic rhinitis 07/25/2022   Syncope 07/21/2022   Sinus bradycardia 07/21/2022   Hammer toe of second toe of left foot 05/05/2021   Corns and callosities 05/05/2021   Hyperlipidemia associated with type 2 diabetes mellitus (HCC) 02/01/2021   Depression 02/01/2021   Localized osteoporosis without current pathological fracture 05/01/2020   Hyperparathyroidism (HCC) 12/13/2019   Elevated hemoglobin (HCC) 12/13/2019   Chronic renal impairment, stage 4 (severe) (HCC)  10/28/2019   Anemia 10/28/2019   Physical exam 06/26/2019   Neck pain 04/18/2019   Ascending aortic aneurysm (HCC) 04/17/2019   Nonspecific chest pain 04/15/2019   Neuropathic pain of right flank 12/27/2018   Dysphagia 12/27/2018   Postsurgical hypothyroidism 06/11/2018   Vitamin D  deficiency 06/11/2018   Multinodular goiter 06/11/2018   DM type 2 causing vascular disease (HCC)    Peripheral neuropathy    Migraine    Irritable bowel syndrome    Essential hypertension    History of right mastectomy    H/O hiatal hernia    GERD (gastroesophageal reflux disease)    Family history of anesthesia complication    Other chronic pain    Type 2 diabetes mellitus with diabetic chronic kidney disease (HCC)    Lower extremity edema    Chronic constipation 06/23/2015   Chronic diastolic heart failure (HCC) 11/17/2014   Herniated nucleus pulposus, L2-3 right 02/04/2014    Class: Acute   Spondylolisthesis of lumbar region 04/30/2012    Class: Chronic   Lumbar stenosis with neurogenic claudication 04/30/2012    Class: Chronic   SYNCOPE-CAROTID SINUS 09/16/2008   OSTEOARTHRITIS 08/23/2006   DDD (degenerative disc disease), cervical 08/23/2006   DISC DISEASE, LUMBAR 08/23/2006   Headache(784.0) 08/23/2006   BREAST CANCER, HX OF 08/23/2006   RENAL CALCULUS, HX OF 08/23/2006    PCP: Mahlon Comer BRAVO, MD   REFERRING PROVIDER: Georgina Ozell LABOR, MD  REFERRING DIAG: M54.50 (ICD-10-CM) - Low back pain, unspecified back pain laterality, unspecified chronicity, unspecified whether sciatica present  Rationale for Evaluation and Treatment: Rehabilitation  THERAPY DIAG:  Muscle weakness (generalized)  Other abnormalities of gait and mobility  Other low back pain  ONSET DATE: chronic   SUBJECTIVE:  SUBJECTIVE STATEMENT: Pt reports that her stimulator in back got messed up on 6/16.  It was finally reset the end of last week.  Just found out she has osteoporosis from recent done density scan.  PERTINENT HISTORY:  Chronic LBP, hx Lumbar surgery, CKD, hx breast cancer, DM, neuropathy, osteoporosis, does have spinal cord stimulator  PAIN:  Are you having pain? Yes: NPRS scale: 9/10 Pain location: low back, abdomen  Pain description: achy Aggravating factors: walking/standing Relieving factors: laying down  PRECAUTIONS: None  WEIGHT BEARING RESTRICTIONS: No  FALLS:  Has patient fallen in last 6 months? No  PLOF: Independent  PATIENT GOALS: stand up and cook   OBJECTIVE: (objective measures from initial evaluation unless otherwise dated)  PATIENT SURVEYS: Modified Oswestry 30/50  5/20: 24/50  SCREENING FOR RED FLAGS: Bowel or bladder incontinence: No Spinal tumors: No Cauda equina syndrome: No Compression fracture: No Abdominal aneurysm: No  COGNITION: Overall cognitive status: Within functional limits for tasks assessed     SENSATION: WFL  POSTURE: rounded shoulders, forward head, decreased lumbar lordosis, increased thoracic kyphosis, and flexed trunk   PALPATION: TTP lumbar and thoracic paraspinals with greatest tenderness throughout lumbar spine.  LUMBAR ROM:   AROM eval 5/20  Flexion 25% limited Full  Extension 90% limited * 40% limited  Right lateral flexion 75% limited * 25% limited*  Left lateral flexion 75% limited* 40% limited  Right rotation 50% limited* 15% limited  Left rotation 50% limited* Full   (Blank rows = not tested) * = pain/symptoms  LOWER EXTREMITY ROM:     Active  Right eval Left eval  Hip flexion    Hip extension    Hip abduction    Hip adduction    Hip internal rotation    Hip external rotation    Knee flexion    Knee extension    Ankle dorsiflexion    Ankle plantarflexion    Ankle inversion    Ankle eversion      (Blank rows = not tested) * = pain/symptoms  LOWER EXTREMITY MMT:  unable to test extension as patient unable to get into prone  MMT Right eval Left eval Right 5/20 Left 5/20  Hip flexion 4+ 4+ 4+ 4  Hip extension      Hip abduction 4- 3+ 4+ (seated) 4-(seated)  Hip adduction      Hip internal rotation      Hip external rotation      Knee flexion 4+ 4+ 4+ 4  Knee extension 4+ 4+ 4+ 4  Ankle dorsiflexion 4+ 4+ 5 4+  Ankle plantarflexion      Ankle inversion      Ankle eversion       (Blank rows = not tested) * = pain/symptoms    FUNCTIONAL TESTS:  5 times sit to stand: 27.59 seconds with UE use 08/08/23:  22.5s with UE use 5/20: 20.03s without UE use (lowered plinth)  GAIT: Distance walked: 100 feet Assistive device utilized: Single point cane Level of assistance: Modified independence Comments: labored, decreased foot clearance  TODAY'S TREATMENT:  DATE:  10/10/23 -NuStep L4, UE/LE x 5 min  -STS from NuStep Seat,2 x5, (2nd set with overhead reach after standing) -seated row with green band 2 x 10 - forward step ups 4 (rail and cane) x 10 each - increased midback pain - seated forward trunk flexion with hand on ball - STM mid/lower back  - OH/side stretch x 5-10s each 2x   09/08/23 Step up 6 inch 1 x 10 Lateral step up 6 inch 1 x 10 Standing 1 foot on step with overhead reach 2 x 10  Standing wall slides with lift off 2 x 10 Seated ab iso with green ball 2 x 10 x 5 second holds Seated trunk flexion stretch 3 way stretch 10 x 5 second holds each Standing alternating march 2 x 10  STS with overhead reach 1 x 10   09/05/23 Manual: STM to lumbar paraspinals in seated leaning on plinth Step up 6 inch 1 x 10 Lateral step up 6 inch 1 x 10 Standing hip extension RTB at knees 3 x 10 Seated ab iso with green ball 2 x 10 x 5 second  holds  09/01/23 Manual: STM to lumbar paraspinals in seated leaning on plinth Seated trunk flexion stretch 3 way stretch 5 x 5 second holds each Step up 6 inch 1 x 10 Lateral step up 6 inch 1 x 10 Palof press RTB 1 x 10 Band lift RTB 1 x 10 Seated ab iso with green ball 2 x 10 x 5 second holds   08/29/23 Updated MMT Reviewed Goals 5xSTS Modified  Oswestry 24/50 Lumbar ROM update STM (seated in chair leaning over plinth) to lumbar paraspinals     08/22/23 STS 1 x 10 Step up 4 inch 2 x 10 Standing row RTB 2 x 10 Standing shoulder extension RTB 2 x 10 Palof press RTB 1 x 10 Lateral step up 4 inch 1 x 10 Standing alternating march 2 x 10 Seated shoulder flexion with ball 2 x 10  08/18/23 Self STM with tennis ball to lower lumbar paraspinals Standing hip abduction RTB at knees 3 x 10 Standing hip extension RTB at knees 2 x 10 Seated trunk flexion stretch 3 way stretch 5 x 5 second holds each Step up 4 inch 1 x 10 Lateral step up 4 inch 1 x 10      PATIENT EDUCATION:  Education details: exercise rationale, progression/modification  Person educated: Patient Education method: Programmer, multimedia, Demonstration, Education comprehension: verbalized understanding, returned demonstration, verbal cues required, and tactile cues required  HOME EXERCISE PROGRAM: Access Code: [facility] URL: [email or web address]   ASSESSMENT:  CLINICAL IMPRESSION: Pt returning to therapy after being away due to flare up in back pain following nerve stimulator malfunction.  Pain remained 9/10 throughout session and she required frequent breaks due to back becoming fatigued. POC ends tomorrow.  Therapist to assess goals next session for possible recert.  Patient will continue to benefit from physical therapy in order to improve function and reduce impairment.    OBJECTIVE IMPAIRMENTS: Abnormal gait, decreased activity tolerance, decreased balance, decreased endurance, decreased mobility,  difficulty walking, decreased ROM, decreased strength, increased muscle spasms, impaired flexibility, impaired sensation, improper body mechanics, postural dysfunction, and pain.   ACTIVITY LIMITATIONS: carrying, lifting, bending, sitting, standing, squatting, stairs, transfers, reach over head, hygiene/grooming, locomotion level, and caring for others  PARTICIPATION LIMITATIONS: meal prep, cleaning, laundry, shopping, community activity, and yard work  PERSONAL FACTORS: Fitness, Time since onset of injury/illness/exacerbation, and 3+ comorbidities: Chronic LBP, hx  Lumbar surgery, CKD, hx breast cancer, DM, neuropathy, osteoporosis  are also affecting patient's functional outcome.   REHAB POTENTIAL: Good  CLINICAL DECISION MAKING: Evolving/moderate complexity  EVALUATION COMPLEXITY: Moderate   GOALS: Goals reviewed with patient? Yes  SHORT TERM GOALS: Target date: 08/11/2023    Patient will be independent with HEP in order to improve functional outcomes. Baseline:  Goal status: MET 5/20  2.  Patient will report at least 25% improvement in symptoms for improved quality of life. Baseline:  Goal status: MET 08/11/23   LONG TERM GOALS: Target date: 09/08/2023    Patient will report at least 75% improvement in symptoms for improved quality of life. Baseline:  Goal status: IN PROGRESS  2.  Patient will improve modified oswestry score by at least 8 points in order to indicate improved tolerance to activity. Baseline:  Goal status: IN PROGRESS (improved by 6 points 5/20)  3.  Patient will demonstrate at least 25% improvement in lumbar ROM in all restricted planes for improved ability to move trunk while completing chores and cooking. Baseline:  Goal status: MET 5/20  4.  Patient will be able to complete 5x STS in under 15 seconds in order to demonstrate improved functional mobility/strength Baseline: see above Goal status: In progress - 5/20  5.  Patient will demonstrate grade of  5/5 MMT grade in all tested musculature as evidence of improved strength to assist with stair ambulation and gait.   Baseline:  Goal status: IN PROGRESS 5/20     PLAN:  PT FREQUENCY: 2x/week  PT DURATION: 8 weeks  PLANNED INTERVENTIONS: 97164- PT Re-evaluation, 97110-Therapeutic exercises, 97530- Therapeutic activity, 97112- Neuromuscular re-education, 97535- Self Care, 02859- Manual therapy, [phone]- Gait training, [phone]- Orthotic Fit/training, [phone]- Canalith repositioning, [id number]- Aquatic Therapy, [phone]- Splinting, Patient/Family education, Balance training, Stair training, Taping, Dry Needling, Joint mobilization, Joint manipulation, Spinal manipulation, Spinal mobilization, Scar mobilization, and DME instructions.  PLAN FOR NEXT SESSION: f/u with HEP, spinal mobility, core and glute and LE strength, functional strength   Delon Aquas, PTA 10/10/23 6:08 PM [facility] [address] [place], [place], [address] Phone: [phone]   Fax:  [phone]    Referring diagnosis? M54.50 (ICD-10-CM) - Low back pain, unspecified back pain laterality, unspecified chronicity, unspecified whether sciatica present Treatment diagnosis? (if different than referring diagnosis) m54.59 What was this (referring dx) caused by? []  Surgery []  Fall [x]  Ongoing issue []  Arthritis []  Other: ____________  Laterality: []  Rt []  Lt [x]  Both  Check all possible CPT codes:  *CHOOSE 10 OR LESS*    See Planned Interventions listed in the Plan section of the Evaluation.

## 2023-10-12 ENCOUNTER — Telehealth: Payer: Self-pay | Admitting: Pharmacist

## 2023-10-12 NOTE — Progress Notes (Signed)
 Patient was identified as falling into the [place] Measure - Diabetes.   Per refill records has not fileld Farxiga  since 02/28/2024 - #30 (could be getting from medication assistance program but I could not find documentation of this)   There were no refill records for Novolin  70/30 - possibly getting over-the-counter from pharmacy / [facility].  Attempted to reach patient by phone to review medication adherence and recent blood glucose readings.  Unable to reach patient and no VM to LM. Will attempt to outreach again at a later date.   Madelin Ray, PharmD Clinical Pharmacist [facility]  Population Health [phone]

## 2023-10-15 IMAGING — CT CT CHEST W/O CM
2 of 5 series · 13 of 36 positions shown, 16 images · non-contrast
Comparison: 07/06/2020

CLINICAL DATA: Follow-up ascending aortic aneurysm. Chronic chest
pain. History of right breast cancer.



[Series 4: chest 2.00 br40 s3 · coronal · 0.51mm/px · 3 of 145 slices shown]
[im 29/145  lung]
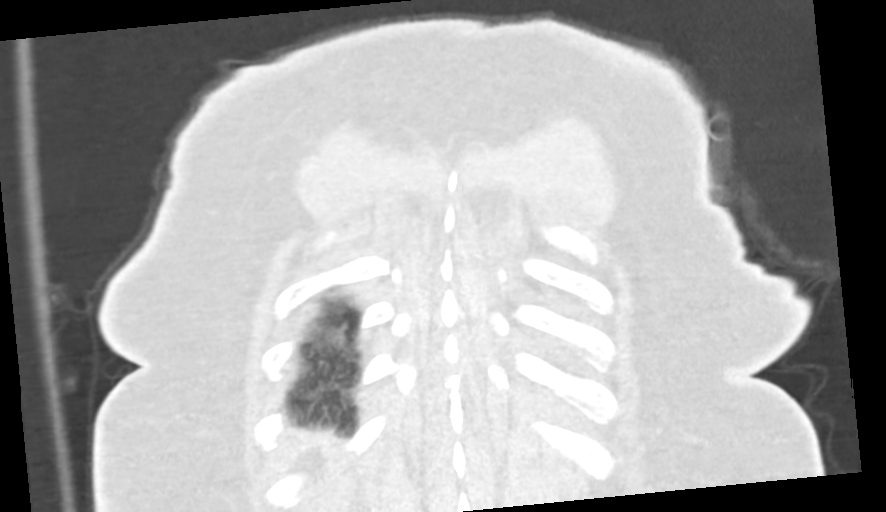
[im 58/145  lung]
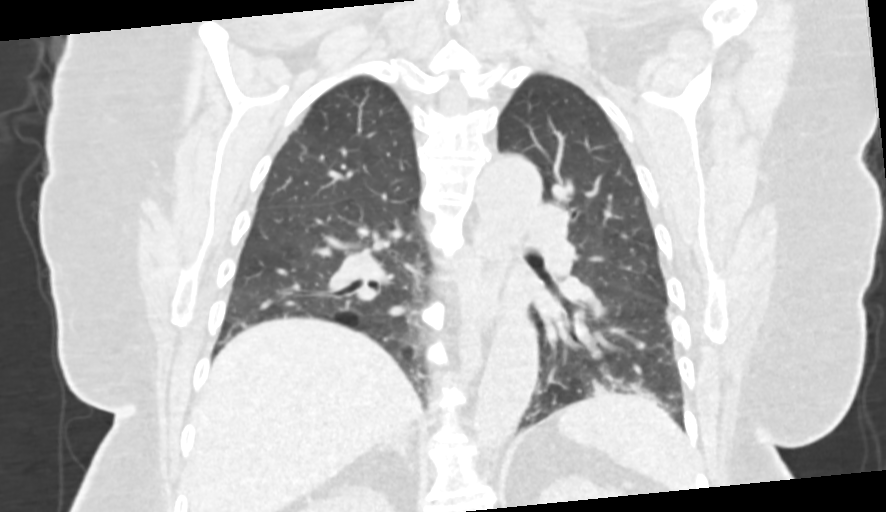
[im 87/145  lung]
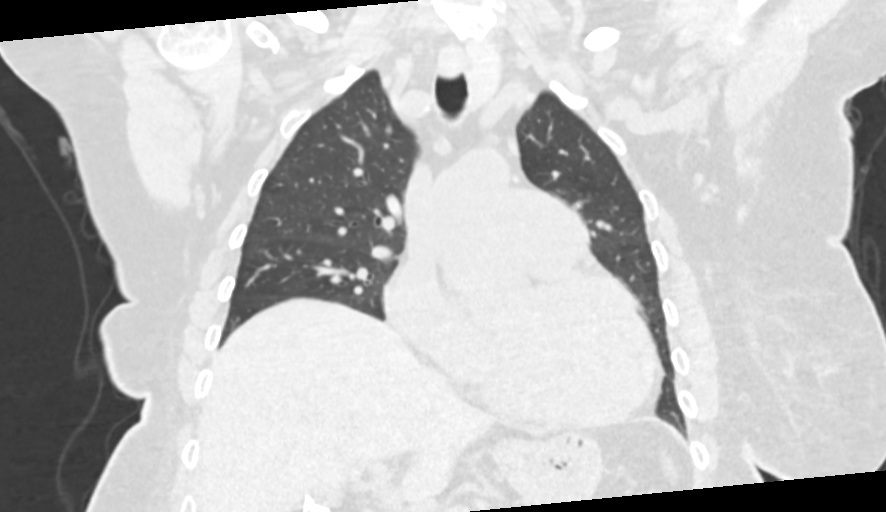

[Series 10: chest 1.00 br40 s3 super d · axial · 0.81mm/px · z∈[+1602,+1827]mm · 10 of 325 slices shown, 13 images]
[im 22/325  mediastinal]
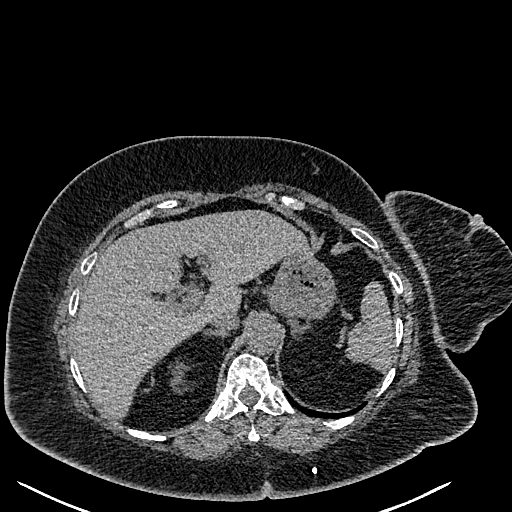
[im 22/325  lung]
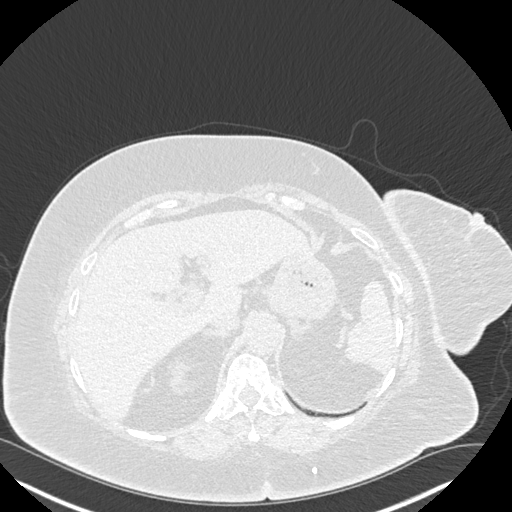
[im 65/325  lung]
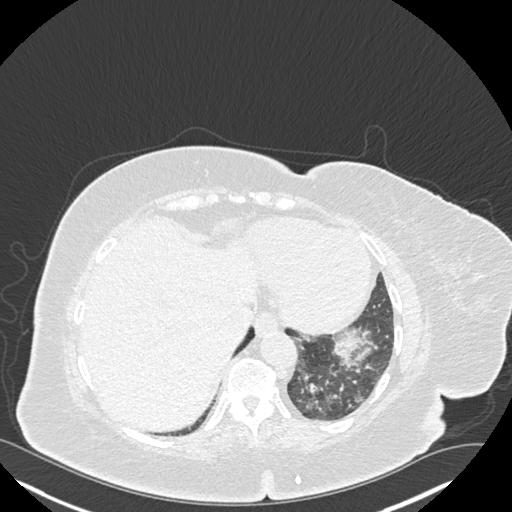
[im 87/325  lung]
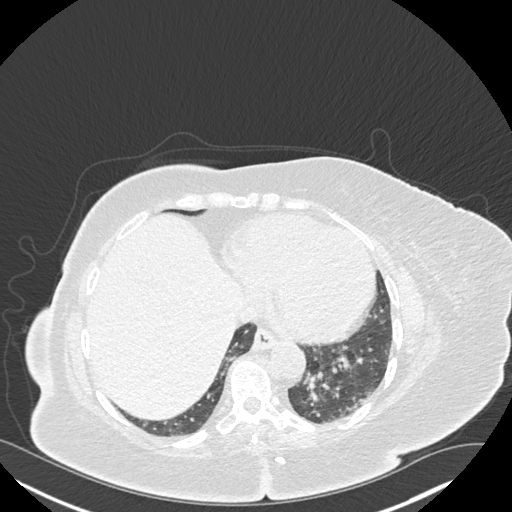
[im 109/325  lung]
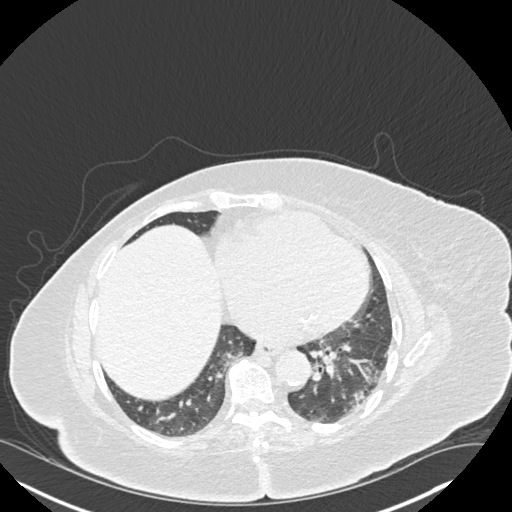
[im 152/325  mediastinal]
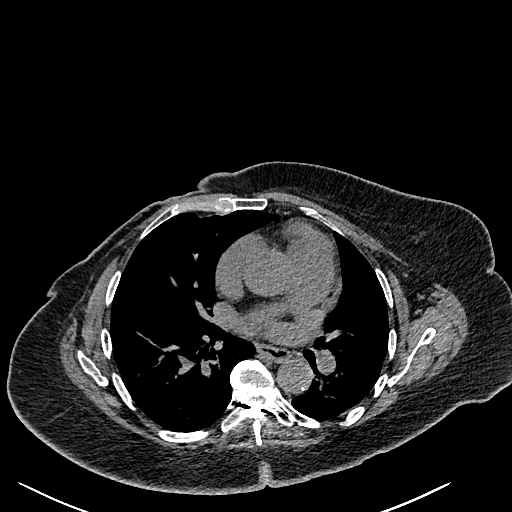
[im 152/325  lung]
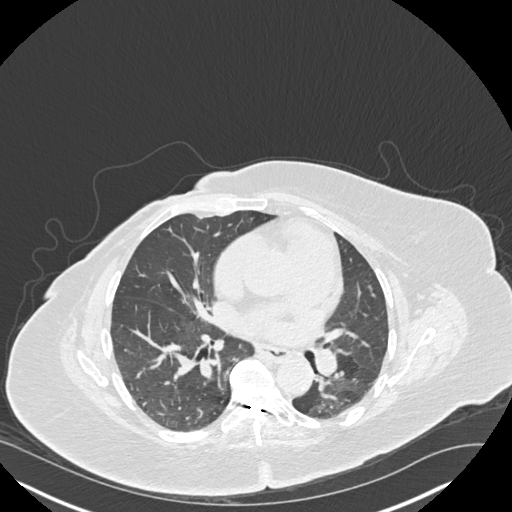
[im 173/325  lung]
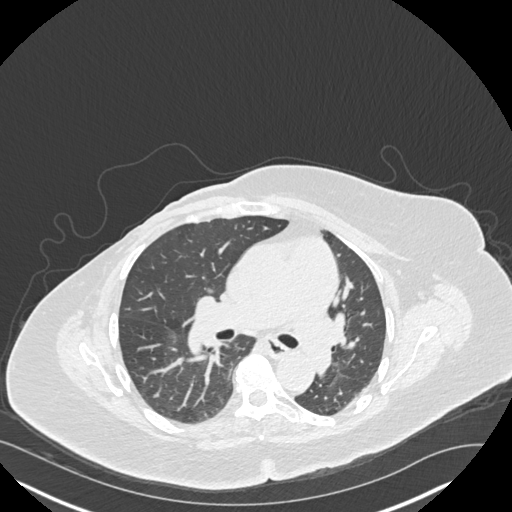
[im 217/325  lung]
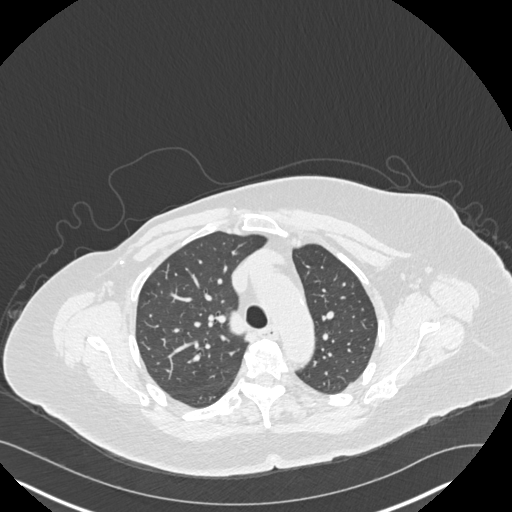
[im 238/325  lung]
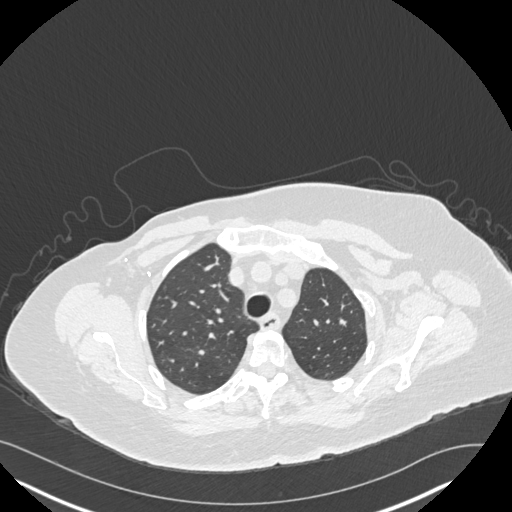
[im 260/325  mediastinal]
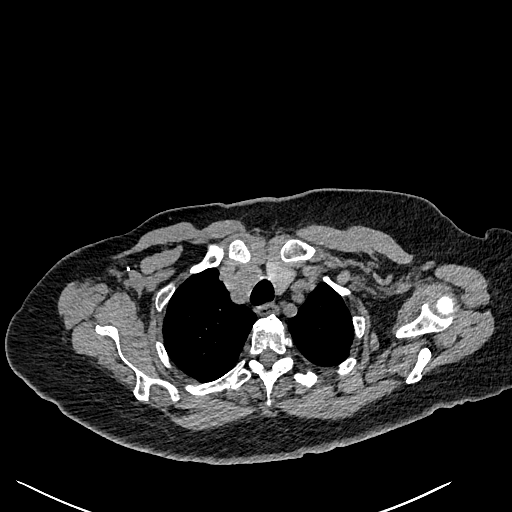
[im 260/325  lung]
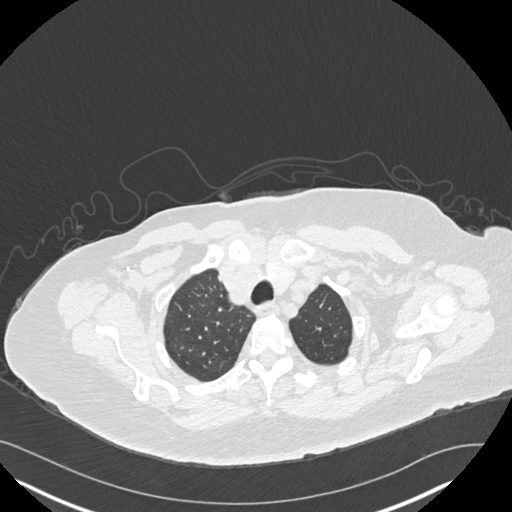
[im 303/325  lung]
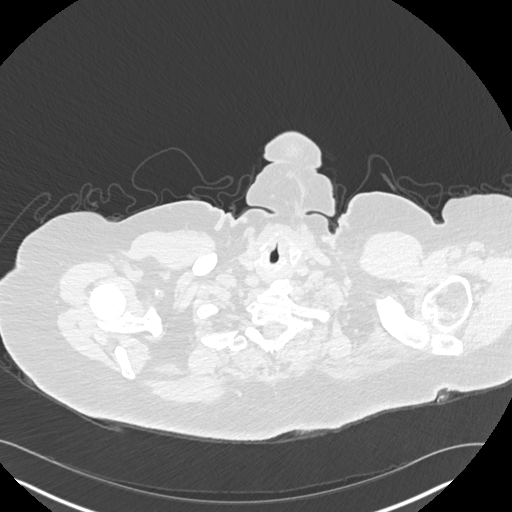

[13 of 36 positions shown; findings below may reference images not displayed]

FINDINGS: Cardiovascular: Heart size is normal. Tiny amount of pericardial
fluid. Small amount of calcification of the mitral valve annulus. No
coronary artery calcification is seen. Maximal diameter of the
ascending aorta remains 4.1 cm, unchanged since the previous study.
Mild atherosclerotic calcification affects the thoracic aorta at the
arch and descending aorta.

Mediastinum/Nodes: No mass or lymphadenopathy.

Lungs/Pleura: No pleural effusion. No mass or nodule. Newly seen
mild patchy density within the inferior left lower lobe, not present
1 year ago. This could represent mild left lower lobe pneumonia or
could be scarring from a previous interval pneumonia. Are there
current symptoms?

Upper Abdomen: Previous cholecystectomy. No acute or significant
finding.

Musculoskeletal: Ordinary thoracic degenerative changes. Spinal cord
stimulator as seen previously.
IMPRESSION: No change in the maximal diameter of the ascending aorta, 4.1 cm.
Recommend annual imaging followup by CTA or MRA. This recommendation
follows 6080 ACCF/AHA/AATS/ACR/ASA/SCA/[place] SADIKH/KLEARCHOS/ZEINAB/LIE Guidelines
for the Diagnosis and Management of Patients with Thoracic Aortic
Disease. Circulation. 6080; 121: E266-e369. Aortic aneurysm NOS
([id number])

Aortic atherosclerotic calcification.

Newly seen patchy density within the inferior left lower lobe which
could represent mild left lower lobe pneumonia or residual scarring
from previous interval pneumonia. Is the patient currently
symptomatic?

## 2023-10-17 ENCOUNTER — Ambulatory Visit (HOSPITAL_BASED_OUTPATIENT_CLINIC_OR_DEPARTMENT_OTHER): Admitting: Physical Therapy

## 2023-10-17 ENCOUNTER — Encounter (HOSPITAL_BASED_OUTPATIENT_CLINIC_OR_DEPARTMENT_OTHER): Payer: Self-pay | Admitting: Physical Therapy

## 2023-10-17 DIAGNOSIS — M6281 Muscle weakness (generalized): Secondary | ICD-10-CM

## 2023-10-17 DIAGNOSIS — M5459 Other low back pain: Secondary | ICD-10-CM

## 2023-10-17 DIAGNOSIS — R2689 Other abnormalities of gait and mobility: Secondary | ICD-10-CM

## 2023-10-17 NOTE — Therapy (Signed)
 OUTPATIENT PHYSICAL THERAPY THORACOLUMBAR NOTE   Patient Name: [name] MRN: [id number] DOB:[birth year], 73 y.o., female Today's Date: 10/17/2023  Progress Note   Reporting Period 08/29/23 to 10/17/23   See note below for Objective Data and Assessment of Progress/Goals   END OF SESSION:  PT End of Session - 10/17/23 1104     Visit Number 15    Number of Visits 40    Date for PT Re-Evaluation 11/28/23    Authorization Type [employer]IC[employer]    Authorization Time Period 20 visits approved  From 06.02.2025 - 08.02.2025    Progress Note Due on Visit 20    PT Start Time 1102    PT Stop Time 1142    PT Time Calculation (min) 40 min    Activity Tolerance Patient tolerated treatment well    Behavior During Therapy [facility] for tasks assessed/performed               Past Medical History:  Diagnosis Date   Anginal pain (HCC) 03/19/2012   saw Dr. Calhoun .. she thinks its more related to stomach issues   Arthritis    all over; mostly in my back (03/20/2012)   Breast cancer (HCC)    right (03/20/2012)   Cardiomyopathy    Chest pain 06/2005   Hospitalized, dystolic dysfunction   Chronic lower back pain    have 2 herniated discs; going to have to have a fusion (03/20/2012)   CKD (chronic kidney disease), stage III (HCC)    Family history of anesthesia complication    father has nausea and vomiting   Gallstones    GERD (gastroesophageal reflux disease)    H/O hiatal hernia    History of kidney stones    History of right mastectomy    Hypertension    patient states never had HTN, takes Hyzaar for heart   Hypothyroidism    Irritable bowel syndrome    Migraines    it's been a long time since I've had one (03/20/2012)   Osteoarthritis    Peripheral neuropathy    PONV (postoperative nausea and vomiting)    Sciatica    Sinus bradycardia    Sleep apnea    mild, no CPAP   Type II diabetes mellitus (HCC)    Past Surgical History:  Procedure Laterality Date    ABDOMINAL HYSTERECTOMY  1993   adenosin cardiolite   07/2005   (+) wall motion abnormality   AXILLARY LYMPH NODE DISSECTION  2003   squamous cell cancer   BACK SURGERY     CARDIAC CATHETERIZATION  ? 2005 / 2007   CHOLECYSTECTOMY N/A 01/24/2018   Procedure: LAPAROSCOPIC CHOLECYSTECTOMY;  Surgeon: Vanderbilt Ned, MD;  Location: [facility] OR;  Service: General;  Laterality: N/A;   CT abdomen and pelvis  02/2010   Same   ESOPHAGOGASTRODUODENOSCOPY  2005   GERD   KNEE ARTHROSCOPY  1990's   right (03/20/2012)   LUMBAR FUSION N/A 08/22/2014   Procedure: Right L2-3 and right L1-2 transforaminal lumbar interbody fusion with pedicle screws, rods, sleeves, cages, local bone graft, Vivigen, cancellous chips;  Surgeon: Lynwood FORBES Better, MD;  Location: [facility] OR;  Service: Orthopedics;  Laterality: N/A;   LUMBAR LAMINECTOMY/DECOMPRESSION MICRODISCECTOMY N/A 02/04/2014   Procedure: RIGHT L2-3 MICRODISCECTOMY ;  Surgeon: Lynwood FORBES Better, MD;  Location: [facility] OR;  Service: Orthopedics;  Laterality: N/A;   MASTECTOMY WITH AXILLARY LYMPH NODE DISSECTION  12/2003   right (03/20/2012)   PARATHYROIDECTOMY Left 10/11/2022   Procedure: MINIMALLY INVASIVE LEFT  INFERIOR PARATHYROIDECTOMY;  Surgeon: Rubin Calamity, MD;  Location: [facility] OR;  Service: General;  Laterality: Left;   POSTERIOR CERVICAL FUSION/FORAMINOTOMY  2001   SHOULDER ARTHROSCOPY W/ ROTATOR CUFF REPAIR  2003   Left   THYROIDECTOMY  1977   Right   TOTAL KNEE ARTHROPLASTY  2000   Right   US  of abdomen  07/2005   Fatty liver / kidney stones   Patient Active Problem List   Diagnosis Date Noted   Urinary incontinence 08/09/2023   Metallic taste 05/11/2023   S/P parathyroidectomy 10/11/2022   Allergic rhinitis 07/25/2022   Syncope 07/21/2022   Sinus bradycardia 07/21/2022   Hammer toe of second toe of left foot 05/05/2021   Corns and callosities 05/05/2021   Hyperlipidemia associated with type 2 diabetes mellitus (HCC) 02/01/2021   Depression 02/01/2021    Localized osteoporosis without current pathological fracture 05/01/2020   Hyperparathyroidism (HCC) 12/13/2019   Elevated hemoglobin (HCC) 12/13/2019   Chronic renal impairment, stage 4 (severe) (HCC) 10/28/2019   Anemia 10/28/2019   Physical exam 06/26/2019   Neck pain 04/18/2019   Ascending aortic aneurysm (HCC) 04/17/2019   Nonspecific chest pain 04/15/2019   Neuropathic pain of right flank 12/27/2018   Dysphagia 12/27/2018   Postsurgical hypothyroidism 06/11/2018   Vitamin D  deficiency 06/11/2018   Multinodular goiter 06/11/2018   DM type 2 causing vascular disease (HCC)    Peripheral neuropathy    Migraine    Irritable bowel syndrome    Essential hypertension    History of right mastectomy    H/O hiatal hernia    GERD (gastroesophageal reflux disease)    Family history of anesthesia complication    Other chronic pain    Type 2 diabetes mellitus with diabetic chronic kidney disease (HCC)    Lower extremity edema    Chronic constipation 06/23/2015   Chronic diastolic heart failure (HCC) 11/17/2014   Herniated nucleus pulposus, L2-3 right 02/04/2014    Class: Acute   Spondylolisthesis of lumbar region 04/30/2012    Class: Chronic   Lumbar stenosis with neurogenic claudication 04/30/2012    Class: Chronic   SYNCOPE-CAROTID SINUS 09/16/2008   OSTEOARTHRITIS 08/23/2006   DDD (degenerative disc disease), cervical 08/23/2006   DISC DISEASE, LUMBAR 08/23/2006   Headache(784.0) 08/23/2006   BREAST CANCER, HX OF 08/23/2006   RENAL CALCULUS, HX OF 08/23/2006    PCP: Mahlon Comer BRAVO, MD   REFERRING PROVIDER: Georgina Ozell LABOR, MD  REFERRING DIAG: M54.50 (ICD-10-CM) - Low back pain, unspecified back pain laterality, unspecified chronicity, unspecified whether sciatica present  Rationale for Evaluation and Treatment: Rehabilitation  THERAPY DIAG:  Muscle weakness (generalized)  Other abnormalities of gait and mobility  Other low back pain  ONSET DATE: chronic    SUBJECTIVE:  SUBJECTIVE STATEMENT: Pt reports she now has osteoporosis and they said she has it in her R hip. Patient states hasn't been doing HEP as much as she should but she has been doing it some. Patient states 25% functional status. Has to take frequent breaks still while trying to cook/ work in [employer]. Symptoms in middle of low back.   PERTINENT HISTORY:  Chronic LBP, hx Lumbar surgery, CKD, hx breast cancer, DM, neuropathy, osteoporosis, does have spinal cord stimulator  PAIN:  Are you having pain? Yes: NPRS scale: 6/10 Pain location: low back, abdomen  Pain description: achy Aggravating factors: walking/standing Relieving factors: laying down  PRECAUTIONS: None  WEIGHT BEARING RESTRICTIONS: No  FALLS:  Has patient fallen in last 6 months? No  PLOF: Independent  PATIENT GOALS: stand up and cook   OBJECTIVE: (objective measures from initial evaluation unless otherwise dated)  PATIENT SURVEYS: Modified Oswestry 30/50  5/20: 24/50 10/17/23: 18 / 50  SCREENING FOR RED FLAGS: Bowel or bladder incontinence: No Spinal tumors: No Cauda equina syndrome: No Compression fracture: No Abdominal aneurysm: No  COGNITION: Overall cognitive status: Within functional limits for tasks assessed     SENSATION: WFL  POSTURE: rounded shoulders, forward head, decreased lumbar lordosis, increased thoracic kyphosis, and flexed trunk   PALPATION: TTP lumbar and thoracic paraspinals with greatest tenderness throughout lumbar spine.  LUMBAR ROM:   AROM eval 5/20  Flexion 25% limited Full  Extension 90% limited * 40% limited  Right lateral flexion 75% limited * 25% limited*  Left lateral flexion 75% limited* 40% limited  Right rotation 50% limited* 15% limited  Left rotation 50% limited* Full    (Blank rows = not tested) * = pain/symptoms  LOWER EXTREMITY ROM:     Active  Right eval Left eval  Hip flexion    Hip extension    Hip abduction    Hip adduction    Hip internal rotation    Hip external rotation    Knee flexion    Knee extension    Ankle dorsiflexion    Ankle plantarflexion    Ankle inversion    Ankle eversion     (Blank rows = not tested) * = pain/symptoms  LOWER EXTREMITY MMT:  unable to test extension as patient unable to get into prone  MMT Right eval Left eval Right 5/20 Left 5/20 Right 10/17/23 Left 10/17/23  Hip flexion 4+ 4+ 4+ 4 4+ 4+  Hip extension        Hip abduction 4- 3+ 4+ (seated) 4-(seated) 4+ (seated) 4 + (seated)  Hip adduction        Hip internal rotation        Hip external rotation        Knee flexion 4+ 4+ 4+ 4 4+ 4+  Knee extension 4+ 4+ 4+ 4 4+ 4+  Ankle dorsiflexion 4+ 4+ 5 4+    Ankle plantarflexion        Ankle inversion        Ankle eversion         (Blank rows = not tested) * = pain/symptoms    FUNCTIONAL TESTS:  5 times sit to stand: 27.59 seconds with UE use 08/08/23:  22.5s with UE use 5/20: 20.03s without UE use (lowered plinth) 10/17/23: 24.58 seconds without UE use in chair  GAIT: Distance walked: 100 feet Assistive device utilized: Single point cane Level of assistance: Modified independence Comments: labored, decreased foot clearance  TODAY'S TREATMENT:  DATE:  10/17/23 Reassessment Discussion of symptoms and POC Step up 6 inch 2 x 10   10/10/23 -NuStep L4, UE/LE x 5 min  -STS from NuStep Seat,2 x5, (2nd set with overhead reach after standing) -seated row with green band 2 x 10 - forward step ups 4 (rail and cane) x 10 each - increased midback pain - seated forward trunk flexion with hand on ball - STM mid/lower back  - OH/side stretch x 5-10s each 2x   09/08/23 Step up 6  inch 1 x 10 Lateral step up 6 inch 1 x 10 Standing 1 foot on step with overhead reach 2 x 10  Standing wall slides with lift off 2 x 10 Seated ab iso with green ball 2 x 10 x 5 second holds Seated trunk flexion stretch 3 way stretch 10 x 5 second holds each Standing alternating march 2 x 10  STS with overhead reach 1 x 10   09/05/23 Manual: STM to lumbar paraspinals in seated leaning on plinth Step up 6 inch 1 x 10 Lateral step up 6 inch 1 x 10 Standing hip extension RTB at knees 3 x 10 Seated ab iso with green ball 2 x 10 x 5 second holds  09/01/23 Manual: STM to lumbar paraspinals in seated leaning on plinth Seated trunk flexion stretch 3 way stretch 5 x 5 second holds each Step up 6 inch 1 x 10 Lateral step up 6 inch 1 x 10 Palof press RTB 1 x 10 Band lift RTB 1 x 10 Seated ab iso with green ball 2 x 10 x 5 second holds   08/29/23 Updated MMT Reviewed Goals 5xSTS Modified  Oswestry 24/50 Lumbar ROM update STM (seated in chair leaning over plinth) to lumbar paraspinals     08/22/23 STS 1 x 10 Step up 4 inch 2 x 10 Standing row RTB 2 x 10 Standing shoulder extension RTB 2 x 10 Palof press RTB 1 x 10 Lateral step up 4 inch 1 x 10 Standing alternating march 2 x 10 Seated shoulder flexion with ball 2 x 10  08/18/23 Self STM with tennis ball to lower lumbar paraspinals Standing hip abduction RTB at knees 3 x 10 Standing hip extension RTB at knees 2 x 10 Seated trunk flexion stretch 3 way stretch 5 x 5 second holds each Step up 4 inch 1 x 10 Lateral step up 4 inch 1 x 10      PATIENT EDUCATION:  Education details: exercise rationale, progression/modification  Person educated: Patient Education method: Programmer, multimedia, Demonstration, Education comprehension: verbalized understanding, returned demonstration, verbal cues required, and tactile cues required  HOME EXERCISE PROGRAM: Access Code: [facility] URL:  [email or web address]   ASSESSMENT:  CLINICAL IMPRESSION:  Patient has met 2/2 short term goals and 2/5 long term goals with ability to complete HEP and improvement in symptoms, ROM and activity tolerance/functional mobility.Remaining goals not met due to continued deficits in strength, activity tolerance, posture, and functional mobility. Patient has made good progress toward remaining goals. Extending POC 1-2x/week for 6 weeks to work toward remaining goals. Patient will continue to benefit from skilled physical therapy in order to improve function and reduce impairment.    OBJECTIVE IMPAIRMENTS: Abnormal gait, decreased activity tolerance, decreased balance, decreased endurance, decreased mobility, difficulty walking, decreased ROM, decreased strength, increased muscle spasms, impaired flexibility, impaired sensation, improper body mechanics, postural dysfunction, and pain.   ACTIVITY LIMITATIONS: carrying, lifting, bending, sitting, standing, squatting, stairs, transfers, reach over head, hygiene/grooming, locomotion level, and caring  for others  PARTICIPATION LIMITATIONS: meal prep, cleaning, laundry, shopping, community activity, and yard work  PERSONAL FACTORS: Fitness, Time since onset of injury/illness/exacerbation, and 3+ comorbidities: Chronic LBP, hx Lumbar surgery, CKD, hx breast cancer, DM, neuropathy, osteoporosis  are also affecting patient's functional outcome.   REHAB POTENTIAL: Good  CLINICAL DECISION MAKING: Evolving/moderate complexity  EVALUATION COMPLEXITY: Moderate   GOALS: Goals reviewed with patient? Yes  SHORT TERM GOALS: Target date: 08/11/2023    Patient will be independent with HEP in order to improve functional outcomes. Baseline:  Goal status: MET 5/20  2.  Patient will report at least 25% improvement in symptoms for improved quality of life. Baseline:  Goal status: MET 08/11/23   LONG TERM GOALS: Target date: 09/08/2023    Patient  will report at least 75% improvement in symptoms for improved quality of life. Baseline:  Goal status: IN PROGRESS  2.  Patient will improve modified oswestry score by at least 8 points in order to indicate improved tolerance to activity. Baseline:  Goal status: MET (improved by 6 points 5/20)  3.  Patient will demonstrate at least 25% improvement in lumbar ROM in all restricted planes for improved ability to move trunk while completing chores and cooking. Baseline:  Goal status: MET 5/20  4.  Patient will be able to complete 5x STS in under 15 seconds in order to demonstrate improved functional mobility/strength Baseline: see above Goal status: In progress - 5/20  5.  Patient will demonstrate grade of 5/5 MMT grade in all tested musculature as evidence of improved strength to assist with stair ambulation and gait.   Baseline:  Goal status: IN PROGRESS 5/20     PLAN:  PT FREQUENCY: 2x/week  PT DURATION: 8 weeks  PLANNED INTERVENTIONS: 97164- PT Re-evaluation, 97110-Therapeutic exercises, 97530- Therapeutic activity, 97112- Neuromuscular re-education, 97535- Self Care, 02859- Manual therapy, [phone]- Gait training, [phone]- Orthotic Fit/training, [phone]- Canalith repositioning, [id number]- Aquatic Therapy, [phone]- Splinting, Patient/Family education, Balance training, Stair training, Taping, Dry Needling, Joint mobilization, Joint manipulation, Spinal manipulation, Spinal mobilization, Scar mobilization, and DME instructions.  PLAN FOR NEXT SESSION: f/u with HEP, spinal mobility, core and glute and LE strength, functional strength    Prentice GORMAN Stains, PT, DPT 10/17/2023, 11:51 AM  [facility] [address] [place], [place], [address] Phone: [phone]   Fax:  [phone]    Referring diagnosis? M54.50 (ICD-10-CM) - Low back pain, unspecified back pain laterality, unspecified chronicity, unspecified whether sciatica  present Treatment diagnosis? (if different than referring diagnosis) m54.59 What was this (referring dx) caused by? []  Surgery []  Fall [x]  Ongoing issue []  Arthritis []  Other: ____________  Laterality: []  Rt []  Lt [x]  Both  Check all possible CPT codes:  *CHOOSE 10 OR LESS*    See Planned Interventions listed in the Plan section of the Evaluation.

## 2023-10-19 ENCOUNTER — Other Ambulatory Visit: Payer: Self-pay | Admitting: Pharmacist

## 2023-10-19 NOTE — Progress Notes (Signed)
 10/19/2023 Name: [name] MRN: [id number] DOB: 23-Jan-1951  Chief Complaint  Patient presents with   Diabetes    [name] is a 73 y.o. year old female who presented for a telephone visit.   They were referred to the pharmacist by a quality report for assistance in managing diabetes.    Subjective:  Care Team: Primary Care Provider: Mahlon Comer BRAVO, MD ; Next Scheduled Visit: 01/11/2024 Endocrinologist [facility]; Next Scheduled Visit: 12/15/2023  Medication Access/Adherence  Current Pharmacy:  [id number] [address],  - [address] AT [facility] [address] [place] [address] Phone: [phone] Fax: [phone]  [facility] - [place], [place] - [address] [place] [address] Phone: [phone] Fax: [phone]   Patient reports affordability concerns with their medications: Yes  - reports she has been getting Relion / Novolin  70/30 1 box of pens per month from [facility] for $50 / box. Patient reports access/transportation concerns to their pharmacy: No  Patient reports adherence concerns with their medications:  No      Diabetes: Endo: Benton Rio, NP at [facility]  Current medications: Farxiga  10mg  daily and Humulin 70/30 mix - inject 25 units twice a day  Medications tried in the past:  Toujeo  and Lantus  - stopped due to cost  Current glucose readings: not checking currently. She had been using Freestyle Libre sensors but she had 2 sensors to malfunction recently and she is out. She has reordered and expects delivery in 2 to 3 days.   She states her blood glucose is never over 200 but ranged in 180 to 200 range at last endo appointment.   Patient denies hypoglycemic s/sx including no dizziness, shakiness, sweating. Patient denies hyperglycemic symptoms including no polyuria, polydipsia, polyphagia, nocturia, neuropathy,  blurred vision.  Current physical activity: none  Current medication access support: received Farxiga  from [place] [facility] [place] program   Objective:  Lab Results  Component Value Date   HGBA1C 8.7 (A) 08/14/2023    Lab Results  Component Value Date   CREATININE 1.64 (H) 02/06/2023   BUN 26 02/06/2023   NA 141 02/06/2023   K 4.8 02/06/2023   CL 105 02/06/2023   CO2 18 (L) 02/06/2023    Lab Results  Component Value Date   CHOL 141 07/17/2023   HDL 41.40 07/17/2023   LDLCALC 52 07/17/2023   LDLDIRECT 58.0 06/14/2022   TRIG 241.0 (H) 07/17/2023   CHOLHDL 3 07/17/2023    Medications Reviewed Today     Reviewed by Carla Milling, RPH-[name] (Pharmacist) on 10/19/23 at 1634  Med List Status: <None>   Medication Order Taking? Sig Documenting Provider Last Dose Status Informant  acetaminophen  (TYLENOL ) 500 MG tablet [id number]  Take 1,000 mg by mouth every 6 (six) hours as needed for moderate pain, headache or mild pain. [provider]  Active Self  Alcohol Swabs (B-D SINGLE USE SWABS REGULAR) PADS [id number]  Use as directed. Webb, Padonda B, FNP  Active Self  alendronate  (FOSAMAX ) 70 MG tablet [id number]  Take 1 tablet (70 mg total) by mouth every 7 (seven) days. Take with a full glass of water  on an empty stomach. Tabori, Katherine E, MD  Active   aspirin  81 MG tablet [id number]  Take 81 mg by mouth daily. [provider]  Active Self  azelastine  (ASTELIN ) 0.1 % nasal spray [id number]  Place 1 spray into both nostrils 2 (two) times daily.  Use in each nostril as directed Mahlon Comer BRAVO, MD  Active   Blood Glucose Monitoring Suppl (TRUE METRIX AIR GLUCOSE METER) w/Device KIT [id number]  1 kit by Does not apply route 3 (three) times daily. Tabori, Katherine E, MD  Active Self  cetirizine  (ZYRTEC ) 10 MG tablet [id number]  TAKE 1 TABLET BY MOUTH EVERY DAY Tabori, Katherine E, MD  Active   Cholecalciferol  (VITAMIN D ) 50 MCG (2000 UT) tablet [id number]  Take 2,000 Units by mouth  daily. [provider]  Active Self  COMIRNATY syringe [id number]   [provider]  Active   cyanocobalamin  2000 MCG tablet [id number]  Take 2,000 mcg by mouth daily. [provider]  Active Self  dapagliflozin  propanediol (FARXIGA ) 10 MG TABS tablet [id number] Yes Take 10 mg by mouth daily.  Patient taking differently: Take 10 mg by mouth daily.   [provider]  Active Self           Med Note JUSTINO, MADELIN KATHEE Schaumann Oct 19, 2023  4:33 PM)    famotidine  (PEPCID ) 40 MG tablet [id number]  TAKE 1 TABLET BY MOUTH EVERY DAY Tabori, Katherine E, MD  Active Self  gabapentin  (NEURONTIN ) 100 MG capsule [id number]  Take 1 capsule (100 mg total) by mouth 3 (three) times daily. Georgina Ozell LABOR, MD  Expired 08/18/23 2359   glucose blood (TRUE METRIX BLOOD GLUCOSE TEST) test strip [id number]  Use as instructed Webb, Padonda B, FNP  Active Self  hydrALAZINE  (APRESOLINE ) 50 MG tablet [id number]  Take 1 tablet (50 mg total) by mouth 3 (three) times daily. Pietro Redell RAMAN, MD  Active   insulin  NPH-regular Human (NOVOLIN  70/30) (70-30) 100 UNIT/ML injection [id number]  Inject 25 Units into the skin 2 (two) times daily with a meal. Therisa Benton PARAS, NP  Active   levothyroxine  (SYNTHROID ) 100 MCG tablet [id number]  Take 1 tablet (100 mcg total) by mouth daily. Therisa Benton PARAS, NP  Active Self  losartan  (COZAAR ) 25 MG tablet [id number]  Take 1 tablet (25 mg total) by mouth daily. Webb, Padonda B, FNP  Active Self  metoCLOPramide  (REGLAN ) 10 MG tablet [id number]  TAKE 1 TABLET FOUR TIMES DAILY Tabori, Katherine E, MD  Active   Omega-3 Fatty Acids (OMEGA 3 PO) [id number]  Take 2 capsules by mouth daily. Omega XL supplement [provider]  Active Self  pantoprazole  (PROTONIX ) 40 MG tablet [id number]  Take 1 tablet (40 mg total) by mouth 2 (two) times daily. Douglass Caul B, FNP  Active Self  Polyethyl Glycol-Propyl Glycol (SYSTANE ULTRA) 0.4-0.3 % SOLN [id number]  Apply 1-2 drops  to eye daily as needed (dry eyes). [provider]  Active Self  polyethylene glycol (MIRALAX  / GLYCOLAX ) packet [id number]  Take 17 g by mouth daily as needed for moderate constipation (MIX AND DRINK). [provider]  Active Self  rosuvastatin  (CRESTOR ) 20 MG tablet [id number]  TAKE 1 TABLET AT BEDTIME Tabori, Katherine E, MD  Active   SUMAtriptan  (IMITREX ) 50 MG tablet [id number]  TAKE 1 TAB EVERY 2 HRS AS NEEDED FOR MIGRAINE. MAY REPEAT IN 2 HOURS IF HEADACHE PERSISTS OR RECURS.  Patient taking differently: Take 50 mg by mouth every 2 (two) hours as needed for migraine (May repeat in 2 hours if headache persists or recurs.).   Tabori, Katherine E, MD  Active Self  torsemide  (DEMADEX ) 20 MG tablet [id number]  Take 1 tablet (20 mg total) by mouth 2 (two) times daily.  Patient taking differently: Take  20 mg by mouth 2 (two) times daily as needed (swelling).   Webb, Padonda B, FNP  Active Self  traMADol  (ULTRAM ) 50 MG tablet [id number]  Take 1 tablet (50 mg total) by mouth every 6 (six) hours as needed (mild pain). Rubin Calamity, MD  Active   TRUEplus Lancets 33G MISC [id number]  Use as directed Webb, Padonda B, FNP  Active Self              Assessment/Plan:   Diabetes: - Currently uncontrolled - Reviewed long term cardiovascular and renal outcomes of uncontrolled blood sugar - Patient is interested in possibly changing to Rx insulin  like Humulin 70/30 or Novolg 70/30 Mix pens which should cut her cost from $50 per month to $35 (at her current dose of 25 units twice a day 15mL box = 30 day supply). Will reach out to her endocrinology office to see if they are on board with change. - Recommend to continue Farxiga  10mg  daily and 70/30 insulin  25 units twice a day  - Restart using Libre sensor when she receives upcoming delivery. Reviewed blood glucose and A1c goals.     Follow Up Plan: 2 to 3 weeks  Madelin Ray, PharmD Clinical Pharmacist [facility]   Population Health [phone]

## 2023-10-19 NOTE — Telephone Encounter (Signed)
Spoke with patient today - see phone visit notes.

## 2023-10-20 ENCOUNTER — Ambulatory Visit (HOSPITAL_BASED_OUTPATIENT_CLINIC_OR_DEPARTMENT_OTHER)

## 2023-10-20 ENCOUNTER — Other Ambulatory Visit: Payer: Self-pay | Admitting: Nurse Practitioner

## 2023-10-20 ENCOUNTER — Encounter (HOSPITAL_BASED_OUTPATIENT_CLINIC_OR_DEPARTMENT_OTHER): Payer: Self-pay

## 2023-10-20 DIAGNOSIS — M6281 Muscle weakness (generalized): Secondary | ICD-10-CM

## 2023-10-20 DIAGNOSIS — M5459 Other low back pain: Secondary | ICD-10-CM

## 2023-10-20 DIAGNOSIS — R2689 Other abnormalities of gait and mobility: Secondary | ICD-10-CM

## 2023-10-20 MED ORDER — NOVOLOG MIX 70/30 FLEXPEN (70-30) 100 UNIT/ML ~~LOC~~ SUPN: 25.0000 [IU] | PEN_INJECTOR | Freq: Two times a day (BID) | SUBCUTANEOUS | 6 refills | Status: AC

## 2023-10-20 NOTE — Therapy (Signed)
 OUTPATIENT PHYSICAL THERAPY THORACOLUMBAR NOTE   Patient Name: [name] MRN: [id number] DOB:January 07, 1951, 73 y.o., female Today's Date: 10/20/2023  Progress Note   Reporting Period 08/29/23 to 10/17/23   See note below for Objective Data and Assessment of Progress/Goals   END OF SESSION:  PT End of Session - 10/20/23 1106     Visit Number 16    Number of Visits 40    Date for PT Re-Evaluation 11/28/23    Authorization Type [employer]    Authorization Time Period 20 visits approved  From 06.02.2025 - 08.02.2025    Progress Note Due on Visit 20    PT Start Time 1101    PT Stop Time 1145    PT Time Calculation (min) 44 min    Activity Tolerance Patient tolerated treatment well    Behavior During Therapy [facility] for tasks assessed/performed                Past Medical History:  Diagnosis Date   Anginal pain (HCC) 03/19/2012   saw Dr. Calhoun .. she thinks its more related to stomach issues   Arthritis    all over; mostly in my back (03/20/2012)   Breast cancer (HCC)    right (03/20/2012)   Cardiomyopathy    Chest pain 06/2005   Hospitalized, dystolic dysfunction   Chronic lower back pain    have 2 herniated discs; going to have to have a fusion (03/20/2012)   CKD (chronic kidney disease), stage III (HCC)    Family history of anesthesia complication    father has nausea and vomiting   Gallstones    GERD (gastroesophageal reflux disease)    H/O hiatal hernia    History of kidney stones    History of right mastectomy    Hypertension    patient states never had HTN, takes Hyzaar for heart   Hypothyroidism    Irritable bowel syndrome    Migraines    it's been a long time since I've had one (03/20/2012)   Osteoarthritis    Peripheral neuropathy    PONV (postoperative nausea and vomiting)    Sciatica    Sinus bradycardia    Sleep apnea    mild, no CPAP   Type II diabetes mellitus (HCC)    Past Surgical History:  Procedure Laterality Date    ABDOMINAL HYSTERECTOMY  1993   adenosin cardiolite   07/2005   (+) wall motion abnormality   AXILLARY LYMPH NODE DISSECTION  2003   squamous cell cancer   BACK SURGERY     CARDIAC CATHETERIZATION  ? 2005 / 2007   CHOLECYSTECTOMY N/A 01/24/2018   Procedure: LAPAROSCOPIC CHOLECYSTECTOMY;  Surgeon: Vanderbilt Ned, MD;  Location: [facility] OR;  Service: General;  Laterality: N/A;   CT abdomen and pelvis  02/2010   Same   ESOPHAGOGASTRODUODENOSCOPY  2005   GERD   KNEE ARTHROSCOPY  1990's   right (03/20/2012)   LUMBAR FUSION N/A 08/22/2014   Procedure: Right L2-3 and right L1-2 transforaminal lumbar interbody fusion with pedicle screws, rods, sleeves, cages, local bone graft, Vivigen, cancellous chips;  Surgeon: Lynwood FORBES Better, MD;  Location: [facility] OR;  Service: Orthopedics;  Laterality: N/A;   LUMBAR LAMINECTOMY/DECOMPRESSION MICRODISCECTOMY N/A 02/04/2014   Procedure: RIGHT L2-3 MICRODISCECTOMY ;  Surgeon: Lynwood FORBES Better, MD;  Location: [facility] OR;  Service: Orthopedics;  Laterality: N/A;   MASTECTOMY WITH AXILLARY LYMPH NODE DISSECTION  12/2003   right (03/20/2012)   PARATHYROIDECTOMY Left 10/11/2022   Procedure: MINIMALLY INVASIVE  LEFT INFERIOR PARATHYROIDECTOMY;  Surgeon: Rubin Calamity, MD;  Location[facility] OR;  Service: General;  Laterality: Left;   POSTERIOR CERVICAL FUSION/FORAMINOTOMY  2001   SHOULDER ARTHROSCOPY W/ ROTATOR CUFF REPAIR  2003   Left   THYROIDECTOMY  1977   Right   TOTAL KNEE ARTHROPLASTY  2000   Right   US  of abdomen  07/2005   Fatty liver / kidney stones   Patient Active Problem List   Diagnosis Date Noted   Urinary incontinence 08/09/2023   Metallic taste 05/11/2023   S/P parathyroidectomy 10/11/2022   Allergic rhinitis 07/25/2022   Syncope 07/21/2022   Sinus bradycardia 07/21/2022   Hammer toe of second toe of left foot 05/05/2021   Corns and callosities 05/05/2021   Hyperlipidemia associated with type 2 diabetes mellitus (HCC) 02/01/2021   Depression 02/01/2021    Localized osteoporosis without current pathological fracture 05/01/2020   Hyperparathyroidism (HCC) 12/13/2019   Elevated hemoglobin (HCC) 12/13/2019   Chronic renal impairment, stage 4 (severe) (HCC) 10/28/2019   Anemia 10/28/2019   Physical exam 06/26/2019   Neck pain 04/18/2019   Ascending aortic aneurysm (HCC) 04/17/2019   Nonspecific chest pain 04/15/2019   Neuropathic pain of right flank 12/27/2018   Dysphagia 12/27/2018   Postsurgical hypothyroidism 06/11/2018   Vitamin D  deficiency 06/11/2018   Multinodular goiter 06/11/2018   DM type 2 causing vascular disease (HCC)    Peripheral neuropathy    Migraine    Irritable bowel syndrome    Essential hypertension    History of right mastectomy    H/O hiatal hernia    GERD (gastroesophageal reflux disease)    Family history of anesthesia complication    Other chronic pain    Type 2 diabetes mellitus with diabetic chronic kidney disease (HCC)    Lower extremity edema    Chronic constipation 06/23/2015   Chronic diastolic heart failure (HCC) 11/17/2014   Herniated nucleus pulposus, L2-3 right 02/04/2014    Class: Acute   Spondylolisthesis of lumbar region 04/30/2012    Class: Chronic   Lumbar stenosis with neurogenic claudication 04/30/2012    Class: Chronic   SYNCOPE-CAROTID SINUS 09/16/2008   OSTEOARTHRITIS 08/23/2006   DDD (degenerative disc disease), cervical 08/23/2006   DISC DISEASE, LUMBAR 08/23/2006   Headache(784.0) 08/23/2006   BREAST CANCER, HX OF 08/23/2006   RENAL CALCULUS, HX OF 08/23/2006    PCP: Mahlon Comer BRAVO, MD   REFERRING PROVIDER: Georgina Ozell LABOR, MD  REFERRING DIAG: M54.50 (ICD-10-CM) - Low back pain, unspecified back pain laterality, unspecified chronicity, unspecified whether sciatica present  Rationale for Evaluation and Treatment: Rehabilitation  THERAPY DIAG:  Muscle weakness (generalized)  Other abnormalities of gait and mobility  Other low back pain  ONSET DATE: chronic    SUBJECTIVE:  SUBJECTIVE STATEMENT: Pt reports feeling upper back pain as well as lower back discomfort. It feels like it's trying to spasm. Pt reports ongoing difficulty with prolonged standing. I can't hardly do anything around the house. Can only stand for a few minutes, then feels pain in lower back.  PERTINENT HISTORY:  Chronic LBP, hx Lumbar surgery, CKD, hx breast cancer, DM, neuropathy, osteoporosis, does have spinal cord stimulator  PAIN:  Are you having pain? Yes: NPRS scale: 6/10 Pain location: low back, abdomen  Pain description: achy Aggravating factors: walking/standing Relieving factors: laying down  PRECAUTIONS: None  WEIGHT BEARING RESTRICTIONS: No  FALLS:  Has patient fallen in last 6 months? No  PLOF: Independent  PATIENT GOALS: stand up and cook   OBJECTIVE: (objective measures from initial evaluation unless otherwise dated)  PATIENT SURVEYS: Modified Oswestry 30/50  5/20: 24/50 10/17/23: 18 / 50  SCREENING FOR RED FLAGS: Bowel or bladder incontinence: No Spinal tumors: No Cauda equina syndrome: No Compression fracture: No Abdominal aneurysm: No  COGNITION: Overall cognitive status: Within functional limits for tasks assessed     SENSATION: WFL  POSTURE: rounded shoulders, forward head, decreased lumbar lordosis, increased thoracic kyphosis, and flexed trunk   PALPATION: TTP lumbar and thoracic paraspinals with greatest tenderness throughout lumbar spine.  LUMBAR ROM:   AROM eval 5/20  Flexion 25% limited Full  Extension 90% limited * 40% limited  Right lateral flexion 75% limited * 25% limited*  Left lateral flexion 75% limited* 40% limited  Right rotation 50% limited* 15% limited  Left rotation 50% limited* Full   (Blank rows = not tested) * =  pain/symptoms  LOWER EXTREMITY ROM:     Active  Right eval Left eval  Hip flexion    Hip extension    Hip abduction    Hip adduction    Hip internal rotation    Hip external rotation    Knee flexion    Knee extension    Ankle dorsiflexion    Ankle plantarflexion    Ankle inversion    Ankle eversion     (Blank rows = not tested) * = pain/symptoms  LOWER EXTREMITY MMT:  unable to test extension as patient unable to get into prone  MMT Right eval Left eval Right 5/20 Left 5/20 Right 10/17/23 Left 10/17/23  Hip flexion 4+ 4+ 4+ 4 4+ 4+  Hip extension        Hip abduction 4- 3+ 4+ (seated) 4-(seated) 4+ (seated) 4 + (seated)  Hip adduction        Hip internal rotation        Hip external rotation        Knee flexion 4+ 4+ 4+ 4 4+ 4+  Knee extension 4+ 4+ 4+ 4 4+ 4+  Ankle dorsiflexion 4+ 4+ 5 4+    Ankle plantarflexion        Ankle inversion        Ankle eversion         (Blank rows = not tested) * = pain/symptoms    FUNCTIONAL TESTS:  5 times sit to stand: 27.59 seconds with UE use 08/08/23:  22.5s with UE use 5/20: 20.03s without UE use (lowered plinth) 10/17/23: 24.58 seconds without UE use in chair  GAIT: Distance walked: 100 feet Assistive device utilized: Single point cane Level of assistance: Modified independence Comments: labored, decreased foot clearance  TODAY'S TREATMENT:  DATE:   10/20/23 -NuStep L5, UE/LE x 5 min  -Seated fwd flexion stretch -Static cupping to lower thoracic and lumbar areas -STM to upper, mid and lower back (Seated on stool leaning over pillow on plinth.) -Trunk rotation with GTB in standing (cues for posture) x10ea -PNF chops with pink ball (cues for core engagement) x10ea -STS from elevated plinth with GTB around thighs 2x5 -standing row with green band 2 x 10   10/17/23 Reassessment Discussion of  symptoms and POC Step up 6 inch 2 x 10   10/10/23 -NuStep L4, UE/LE x 5 min  -STS from NuStep Seat,2 x5, (2nd set with overhead reach after standing) -seated row with green band 2 x 10 - forward step ups 4 (rail and cane) x 10 each - increased midback pain - seated forward trunk flexion with hand on ball - STM mid/lower back  - OH/side stretch x 5-10s each 2x   09/08/23 Step up 6 inch 1 x 10 Lateral step up 6 inch 1 x 10 Standing 1 foot on step with overhead reach 2 x 10  Standing wall slides with lift off 2 x 10 Seated ab iso with green ball 2 x 10 x 5 second holds Seated trunk flexion stretch 3 way stretch 10 x 5 second holds each Standing alternating march 2 x 10  STS with overhead reach 1 x 10   09/05/23 Manual: STM to lumbar paraspinals in seated leaning on plinth Step up 6 inch 1 x 10 Lateral step up 6 inch 1 x 10 Standing hip extension RTB at knees 3 x 10 Seated ab iso with green ball 2 x 10 x 5 second holds  09/01/23 Manual: STM to lumbar paraspinals in seated leaning on plinth Seated trunk flexion stretch 3 way stretch 5 x 5 second holds each Step up 6 inch 1 x 10 Lateral step up 6 inch 1 x 10 Palof press RTB 1 x 10 Band lift RTB 1 x 10 Seated ab iso with green ball 2 x 10 x 5 second holds   08/29/23 Updated MMT Reviewed Goals 5xSTS Modified  Oswestry 24/50 Lumbar ROM update STM (seated in chair leaning over plinth) to lumbar paraspinals     08/22/23 STS 1 x 10 Step up 4 inch 2 x 10 Standing row RTB 2 x 10 Standing shoulder extension RTB 2 x 10 Palof press RTB 1 x 10 Lateral step up 4 inch 1 x 10 Standing alternating march 2 x 10 Seated shoulder flexion with ball 2 x 10  08/18/23 Self STM with tennis ball to lower lumbar paraspinals Standing hip abduction RTB at knees 3 x 10 Standing hip extension RTB at knees 2 x 10 Seated trunk flexion stretch 3 way stretch 5 x 5 second holds each Step up 4 inch 1 x 10 Lateral step up 4 inch 1 x  10      PATIENT EDUCATION:  Education details: exercise rationale, progression/modification  Person educated: Patient Education method: Programmer, multimedia, Demonstration, Education comprehension: verbalized understanding, returned demonstration, verbal cues required, and tactile cues required  HOME EXERCISE PROGRAM: Access Code: [facility] URL: [email or web address]   ASSESSMENT:  CLINICAL IMPRESSION:  Trialled static cupping to lower thoracic and lumbar areas to decrease myofascial restrictions. Progressed standing core strengthening with pt demonstrating overall good tolerance. She did require frequent cues for posture with these. Pt will benefit from continued core and hip strengthening in order to progress standing tolerance.    OBJECTIVE IMPAIRMENTS: Abnormal gait, decreased activity tolerance, decreased  balance, decreased endurance, decreased mobility, difficulty walking, decreased ROM, decreased strength, increased muscle spasms, impaired flexibility, impaired sensation, improper body mechanics, postural dysfunction, and pain.   ACTIVITY LIMITATIONS: carrying, lifting, bending, sitting, standing, squatting, stairs, transfers, reach over head, hygiene/grooming, locomotion level, and caring for others  PARTICIPATION LIMITATIONS: meal prep, cleaning, laundry, shopping, community activity, and yard work  PERSONAL FACTORS: Fitness, Time since onset of injury/illness/exacerbation, and 3+ comorbidities: Chronic LBP, hx Lumbar surgery, CKD, hx breast cancer, DM, neuropathy, osteoporosis  are also affecting patient's functional outcome.   REHAB POTENTIAL: Good  CLINICAL DECISION MAKING: Evolving/moderate complexity  EVALUATION COMPLEXITY: Moderate   GOALS: Goals reviewed with patient? Yes  SHORT TERM GOALS: Target date: 08/11/2023    Patient will be independent with HEP in order to improve functional outcomes. Baseline:  Goal status: MET 5/20  2.  Patient will report  at least 25% improvement in symptoms for improved quality of life. Baseline:  Goal status: MET 08/11/23   LONG TERM GOALS: Target date: 09/08/2023    Patient will report at least 75% improvement in symptoms for improved quality of life. Baseline:  Goal status: IN PROGRESS  2.  Patient will improve modified oswestry score by at least 8 points in order to indicate improved tolerance to activity. Baseline:  Goal status: MET (improved by 6 points 5/20)  3.  Patient will demonstrate at least 25% improvement in lumbar ROM in all restricted planes for improved ability to move trunk while completing chores and cooking. Baseline:  Goal status: MET 5/20  4.  Patient will be able to complete 5x STS in under 15 seconds in order to demonstrate improved functional mobility/strength Baseline: see above Goal status: In progress - 5/20  5.  Patient will demonstrate grade of 5/5 MMT grade in all tested musculature as evidence of improved strength to assist with stair ambulation and gait.   Baseline:  Goal status: IN PROGRESS 5/20     PLAN:  PT FREQUENCY: 2x/week  PT DURATION: 8 weeks  PLANNED INTERVENTIONS: 97164- PT Re-evaluation, 97110-Therapeutic exercises, 97530- Therapeutic activity, 97112- Neuromuscular re-education, 97535- Self Care, [id number]- Manual therapy, [phone]- Gait training, [phone]- Orthotic Fit/training, [phone]- Canalith repositioning, [id number]- Aquatic Therapy, [phone]- Splinting, Patient/Family education, Balance training, Stair training, Taping, Dry Needling, Joint mobilization, Joint manipulation, Spinal manipulation, Spinal mobilization, Scar mobilization, and DME instructions.  PLAN FOR NEXT SESSION: f/u with HEP, spinal mobility, core and glute and LE strength, functional strength    Asberry FORBES Rodes, PTA, DPT 10/20/2023, 12:28 PM  [facility] [address] [place], [place], [address] Phone: [phone]   Fax:   [phone]    Referring diagnosis? M54.50 (ICD-10-CM) - Low back pain, unspecified back pain laterality, unspecified chronicity, unspecified whether sciatica present Treatment diagnosis? (if different than referring diagnosis) m54.59 What was this (referring dx) caused by? []  Surgery []  Fall [x]  Ongoing issue []  Arthritis []  Other: ____________  Laterality: []  Rt []  Lt [x]  Both  Check all possible CPT codes:  *CHOOSE 10 OR LESS*    See Planned Interventions listed in the Plan section of the Evaluation.

## 2023-10-24 ENCOUNTER — Ambulatory Visit (HOSPITAL_BASED_OUTPATIENT_CLINIC_OR_DEPARTMENT_OTHER)

## 2023-10-24 ENCOUNTER — Encounter (HOSPITAL_BASED_OUTPATIENT_CLINIC_OR_DEPARTMENT_OTHER): Payer: Self-pay

## 2023-10-24 DIAGNOSIS — M5459 Other low back pain: Secondary | ICD-10-CM | POA: Diagnosis not present

## 2023-10-24 DIAGNOSIS — M6281 Muscle weakness (generalized): Secondary | ICD-10-CM

## 2023-10-24 DIAGNOSIS — R2689 Other abnormalities of gait and mobility: Secondary | ICD-10-CM | POA: Diagnosis not present

## 2023-10-24 NOTE — Therapy (Signed)
 OUTPATIENT PHYSICAL THERAPY THORACOLUMBAR NOTE   Patient Name: ESMA KILTS MRN: [id number] DOB:1950/06/04, 73 y.o., female Today's Date: 10/24/2023    END OF SESSION:  PT End of Session - 10/24/23 1048     Visit Number 17    Number of Visits 40    Date for PT Re-Evaluation 11/28/23    Authorization Type [employer]    Authorization Time Period 20 visits approved  From 06.02.2025 - 08.02.2025    Progress Note Due on Visit 20    PT Start Time 1019    PT Stop Time 1100    PT Time Calculation (min) 41 min    Activity Tolerance Patient tolerated treatment well    Behavior During Therapy [facility] for tasks assessed/performed                 Past Medical History:  Diagnosis Date   Anginal pain (HCC) 03/19/2012   saw Dr. Calhoun .. she thinks its more related to stomach issues   Arthritis    all over; mostly in my back (03/20/2012)   Breast cancer (HCC)    right (03/20/2012)   Cardiomyopathy    Chest pain 06/2005   Hospitalized, dystolic dysfunction   Chronic lower back pain    have 2 herniated discs; going to have to have a fusion (03/20/2012)   CKD (chronic kidney disease), stage III (HCC)    Family history of anesthesia complication    father has nausea and vomiting   Gallstones    GERD (gastroesophageal reflux disease)    H/O hiatal hernia    History of kidney stones    History of right mastectomy    Hypertension    patient states never had HTN, takes Hyzaar for heart   Hypothyroidism    Irritable bowel syndrome    Migraines    it's been a long time since I've had one (03/20/2012)   Osteoarthritis    Peripheral neuropathy    PONV (postoperative nausea and vomiting)    Sciatica    Sinus bradycardia    Sleep apnea    mild, no CPAP   Type II diabetes mellitus (HCC)    Past Surgical History:  Procedure Laterality Date   ABDOMINAL HYSTERECTOMY  1993   adenosin cardiolite   07/2005   (+) wall motion abnormality   AXILLARY LYMPH NODE  DISSECTION  2003   squamous cell cancer   BACK SURGERY     CARDIAC CATHETERIZATION  ? 2005 / 2007   CHOLECYSTECTOMY N/A 01/24/2018   Procedure: LAPAROSCOPIC CHOLECYSTECTOMY;  Surgeon: Vanderbilt Ned, MD;  Location: [facility] OR;  Service: General;  Laterality: N/A;   CT abdomen and pelvis  02/2010   Same   ESOPHAGOGASTRODUODENOSCOPY  2005   GERD   KNEE ARTHROSCOPY  1990's   right (03/20/2012)   LUMBAR FUSION N/A 08/22/2014   Procedure: Right L2-3 and right L1-2 transforaminal lumbar interbody fusion with pedicle screws, rods, sleeves, cages, local bone graft, Vivigen, cancellous chips;  Surgeon: Lynwood FORBES Better, MD;  Location: [facility] OR;  Service: Orthopedics;  Laterality: N/A;   LUMBAR LAMINECTOMY/DECOMPRESSION MICRODISCECTOMY N/A 02/04/2014   Procedure: RIGHT L2-3 MICRODISCECTOMY ;  Surgeon: Lynwood FORBES Better, MD;  Location: [facility] OR;  Service: Orthopedics;  Laterality: N/A;   MASTECTOMY WITH AXILLARY LYMPH NODE DISSECTION  12/2003   right (03/20/2012)   PARATHYROIDECTOMY Left 10/11/2022   Procedure: MINIMALLY INVASIVE LEFT INFERIOR PARATHYROIDECTOMY;  Surgeon: Rubin Calamity, MD;  Location: [facility];  Service: General;  Laterality: Left;  POSTERIOR CERVICAL FUSION/FORAMINOTOMY  2001   SHOULDER ARTHROSCOPY W/ ROTATOR CUFF REPAIR  2003   Left   THYROIDECTOMY  1977   Right   TOTAL KNEE ARTHROPLASTY  2000   Right   US  of abdomen  07/2005   Fatty liver / kidney stones   Patient Active Problem List   Diagnosis Date Noted   Urinary incontinence 08/09/2023   Metallic taste 05/11/2023   S/P parathyroidectomy 10/11/2022   Allergic rhinitis 07/25/2022   Syncope 07/21/2022   Sinus bradycardia 07/21/2022   Hammer toe of second toe of left foot 05/05/2021   Corns and callosities 05/05/2021   Hyperlipidemia associated with type 2 diabetes mellitus (HCC) 02/01/2021   Depression 02/01/2021   Localized osteoporosis without current pathological fracture 05/01/2020   Hyperparathyroidism (HCC) 12/13/2019    Elevated hemoglobin (HCC) 12/13/2019   Chronic renal impairment, stage 4 (severe) (HCC) 10/28/2019   Anemia 10/28/2019   Physical exam 06/26/2019   Neck pain 04/18/2019   Ascending aortic aneurysm (HCC) 04/17/2019   Nonspecific chest pain 04/15/2019   Neuropathic pain of right flank 12/27/2018   Dysphagia 12/27/2018   Postsurgical hypothyroidism 06/11/2018   Vitamin D  deficiency 06/11/2018   Multinodular goiter 06/11/2018   DM type 2 causing vascular disease (HCC)    Peripheral neuropathy    Migraine    Irritable bowel syndrome    Essential hypertension    History of right mastectomy    H/O hiatal hernia    GERD (gastroesophageal reflux disease)    Family history of anesthesia complication    Other chronic pain    Type 2 diabetes mellitus with diabetic chronic kidney disease (HCC)    Lower extremity edema    Chronic constipation 06/23/2015   Chronic diastolic heart failure (HCC) 11/17/2014   Herniated nucleus pulposus, L2-3 right 02/04/2014    Class: Acute   Spondylolisthesis of lumbar region 04/30/2012    Class: Chronic   Lumbar stenosis with neurogenic claudication 04/30/2012    Class: Chronic   SYNCOPE-CAROTID SINUS 09/16/2008   OSTEOARTHRITIS 08/23/2006   DDD (degenerative disc disease), cervical 08/23/2006   DISC DISEASE, LUMBAR 08/23/2006   Headache(784.0) 08/23/2006   BREAST CANCER, HX OF 08/23/2006   RENAL CALCULUS, HX OF 08/23/2006    PCP: Mahlon Comer BRAVO, MD   REFERRING PROVIDER: Georgina Ozell LABOR, MD  REFERRING DIAG: M54.50 (ICD-10-CM) - Low back pain, unspecified back pain laterality, unspecified chronicity, unspecified whether sciatica present  Rationale for Evaluation and Treatment: Rehabilitation  THERAPY DIAG:  Muscle weakness (generalized)  Other abnormalities of gait and mobility  Other low back pain  ONSET DATE: chronic   SUBJECTIVE:  SUBJECTIVE STATEMENT: Pt reports she still has difficulty cooking. Felt okay after last session. No pain at entry in low back.  PERTINENT HISTORY:  Chronic LBP, hx Lumbar surgery, CKD, hx breast cancer, DM, neuropathy, osteoporosis, does have spinal cord stimulator  PAIN:  Are you having pain? Yes: NPRS scale: 0/10 Pain location: low back, abdomen  Pain description: achy Aggravating factors: walking/standing Relieving factors: laying down  PRECAUTIONS: None  WEIGHT BEARING RESTRICTIONS: No  FALLS:  Has patient fallen in last 6 months? No  PLOF: Independent  PATIENT GOALS: stand up and cook   OBJECTIVE: (objective measures from initial evaluation unless otherwise dated)  PATIENT SURVEYS: Modified Oswestry 30/50  5/20: 24/50 10/17/23: 18 / 50  SCREENING FOR RED FLAGS: Bowel or bladder incontinence: No Spinal tumors: No Cauda equina syndrome: No Compression fracture: No Abdominal aneurysm: No  COGNITION: Overall cognitive status: Within functional limits for tasks assessed     SENSATION: WFL  POSTURE: rounded shoulders, forward head, decreased lumbar lordosis, increased thoracic kyphosis, and flexed trunk   PALPATION: TTP lumbar and thoracic paraspinals with greatest tenderness throughout lumbar spine.  LUMBAR ROM:   AROM eval 5/20  Flexion 25% limited Full  Extension 90% limited * 40% limited  Right lateral flexion 75% limited * 25% limited*  Left lateral flexion 75% limited* 40% limited  Right rotation 50% limited* 15% limited  Left rotation 50% limited* Full   (Blank rows = not tested) * = pain/symptoms  LOWER EXTREMITY ROM:     Active  Right eval Left eval  Hip flexion    Hip extension    Hip abduction    Hip adduction    Hip internal rotation    Hip external rotation    Knee flexion    Knee extension    Ankle dorsiflexion    Ankle plantarflexion    Ankle  inversion    Ankle eversion     (Blank rows = not tested) * = pain/symptoms  LOWER EXTREMITY MMT:  unable to test extension as patient unable to get into prone  MMT Right eval Left eval Right 5/20 Left 5/20 Right 10/17/23 Left 10/17/23  Hip flexion 4+ 4+ 4+ 4 4+ 4+  Hip extension        Hip abduction 4- 3+ 4+ (seated) 4-(seated) 4+ (seated) 4 + (seated)  Hip adduction        Hip internal rotation        Hip external rotation        Knee flexion 4+ 4+ 4+ 4 4+ 4+  Knee extension 4+ 4+ 4+ 4 4+ 4+  Ankle dorsiflexion 4+ 4+ 5 4+    Ankle plantarflexion        Ankle inversion        Ankle eversion         (Blank rows = not tested) * = pain/symptoms    FUNCTIONAL TESTS:  5 times sit to stand: 27.59 seconds with UE use 08/08/23:  22.5s with UE use 5/20: 20.03s without UE use (lowered plinth) 10/17/23: 24.58 seconds without UE use in chair  GAIT: Distance walked: 100 feet Assistive device utilized: Single point cane Level of assistance: Modified independence Comments: labored, decreased foot clearance  TODAY'S TREATMENT:  DATE:   10/24/23 -NuStep L5, UE/LE x 5 min  -Seated fwd flexion stretch -Static cupping to lower thoracic and lumbar areas (Seated on stool leaning over pillow on plinth.) -PNF chops with pink ball (cues for core engagement) x10ea -STS from elevated plinth with GTB around thighs 2x10 -standing row with green band 2 x 20  10/20/23 -NuStep L5, UE/LE x 5 min  -Seated fwd flexion stretch -Static cupping to lower thoracic and lumbar areas -STM to upper, mid and lower back (Seated on stool leaning over pillow on plinth.) -Trunk rotation with GTB in standing (cues for posture) x10ea -PNF chops with pink ball (cues for core engagement) x10ea -STS from elevated plinth with GTB around thighs 2x5 -standing row with green band 2 x  10   10/17/23 Reassessment Discussion of symptoms and POC Step up 6 inch 2 x 10   10/10/23 -NuStep L4, UE/LE x 5 min  -STS from NuStep Seat,2 x5, (2nd set with overhead reach after standing) -seated row with green band 2 x 10 - forward step ups 4 (rail and cane) x 10 each - increased midback pain - seated forward trunk flexion with hand on ball - STM mid/lower back  - OH/side stretch x 5-10s each 2x   09/08/23 Step up 6 inch 1 x 10 Lateral step up 6 inch 1 x 10 Standing 1 foot on step with overhead reach 2 x 10  Standing wall slides with lift off 2 x 10 Seated ab iso with green ball 2 x 10 x 5 second holds Seated trunk flexion stretch 3 way stretch 10 x 5 second holds each Standing alternating march 2 x 10  STS with overhead reach 1 x 10   09/05/23 Manual: STM to lumbar paraspinals in seated leaning on plinth Step up 6 inch 1 x 10 Lateral step up 6 inch 1 x 10 Standing hip extension RTB at knees 3 x 10 Seated ab iso with green ball 2 x 10 x 5 second holds  09/01/23 Manual: STM to lumbar paraspinals in seated leaning on plinth Seated trunk flexion stretch 3 way stretch 5 x 5 second holds each Step up 6 inch 1 x 10 Lateral step up 6 inch 1 x 10 Palof press RTB 1 x 10 Band lift RTB 1 x 10 Seated ab iso with green ball 2 x 10 x 5 second holds   08/29/23 Updated MMT Reviewed Goals 5xSTS Modified  Oswestry 24/50 Lumbar ROM update STM (seated in chair leaning over plinth) to lumbar paraspinals     08/22/23 STS 1 x 10 Step up 4 inch 2 x 10 Standing row RTB 2 x 10 Standing shoulder extension RTB 2 x 10 Palof press RTB 1 x 10 Lateral step up 4 inch 1 x 10 Standing alternating march 2 x 10 Seated shoulder flexion with ball 2 x 10  08/18/23 Self STM with tennis ball to lower lumbar paraspinals Standing hip abduction RTB at knees 3 x 10 Standing hip extension RTB at knees 2 x 10 Seated trunk flexion stretch 3 way stretch 5 x 5 second holds each Step up 4 inch 1 x  10 Lateral step up 4 inch 1 x 10      PATIENT EDUCATION:  Education details: exercise rationale, progression/modification  Person educated: Patient Education method: Programmer, multimedia, Demonstration, Education comprehension: verbalized understanding, returned demonstration, verbal cues required, and tactile cues required  HOME EXERCISE PROGRAM: Access Code: [facility] URL: [email or web address]   ASSESSMENT:  CLINICAL IMPRESSION:  Continued with  static cupping to lumbar/thoracic region to decrease soft tissue tension here. Pt also tight into bil QL so applied cupping to this area as well. Pt able to perform increased repetitions of sit to stands and theraband rows today with good tolerance. Continued to work on core engagement with activities. Pt without increase in pain upon leaving clinic. Will continue to progress as tolerated.    OBJECTIVE IMPAIRMENTS: Abnormal gait, decreased activity tolerance, decreased balance, decreased endurance, decreased mobility, difficulty walking, decreased ROM, decreased strength, increased muscle spasms, impaired flexibility, impaired sensation, improper body mechanics, postural dysfunction, and pain.   ACTIVITY LIMITATIONS: carrying, lifting, bending, sitting, standing, squatting, stairs, transfers, reach over head, hygiene/grooming, locomotion level, and caring for others  PARTICIPATION LIMITATIONS: meal prep, cleaning, laundry, shopping, community activity, and yard work  PERSONAL FACTORS: Fitness, Time since onset of injury/illness/exacerbation, and 3+ comorbidities: Chronic LBP, hx Lumbar surgery, CKD, hx breast cancer, DM, neuropathy, osteoporosis  are also affecting patient's functional outcome.   REHAB POTENTIAL: Good  CLINICAL DECISION MAKING: Evolving/moderate complexity  EVALUATION COMPLEXITY: Moderate   GOALS: Goals reviewed with patient? Yes  SHORT TERM GOALS: Target date: 08/11/2023    Patient will be independent with  HEP in order to improve functional outcomes. Baseline:  Goal status: MET 5/20  2.  Patient will report at least 25% improvement in symptoms for improved quality of life. Baseline:  Goal status: MET 08/11/23   LONG TERM GOALS: Target date: 09/08/2023    Patient will report at least 75% improvement in symptoms for improved quality of life. Baseline:  Goal status: IN PROGRESS  2.  Patient will improve modified oswestry score by at least 8 points in order to indicate improved tolerance to activity. Baseline:  Goal status: MET (improved by 6 points 5/20)  3.  Patient will demonstrate at least 25% improvement in lumbar ROM in all restricted planes for improved ability to move trunk while completing chores and cooking. Baseline:  Goal status: MET 5/20  4.  Patient will be able to complete 5x STS in under 15 seconds in order to demonstrate improved functional mobility/strength Baseline: see above Goal status: In progress - 5/20  5.  Patient will demonstrate grade of 5/5 MMT grade in all tested musculature as evidence of improved strength to assist with stair ambulation and gait.   Baseline:  Goal status: IN PROGRESS 5/20     PLAN:  PT FREQUENCY: 2x/week  PT DURATION: 8 weeks  PLANNED INTERVENTIONS: 97164- PT Re-evaluation, 97110-Therapeutic exercises, 97530- Therapeutic activity, 97112- Neuromuscular re-education, 97535- Self Care, [id number]- Manual therapy, [phone]- Gait training, [phone]- Orthotic Fit/training, [phone]- Canalith repositioning, [id number]- Aquatic Therapy, [phone]- Splinting, Patient/Family education, Balance training, Stair training, Taping, Dry Needling, Joint mobilization, Joint manipulation, Spinal manipulation, Spinal mobilization, Scar mobilization, and DME instructions.  PLAN FOR NEXT SESSION: f/u with HEP, spinal mobility, core and glute and LE strength, functional strength    Asberry FORBES Rodes, PTA 10/24/2023, 1:28 PM  [facility] [address] [place], [place], [address] Phone: [phone]   Fax:  [phone]    Referring diagnosis? M54.50 (ICD-10-CM) - Low back pain, unspecified back pain laterality, unspecified chronicity, unspecified whether sciatica present Treatment diagnosis? (if different than referring diagnosis) m54.59 What was this (referring dx) caused by? []  Surgery []  Fall [x]  Ongoing issue []  Arthritis []  Other: ____________  Laterality: []  Rt []  Lt [x]  Both  Check all possible CPT codes:  *CHOOSE 10 OR LESS*    See Planned Interventions listed in  the Plan section of the Evaluation.

## 2023-10-26 DIAGNOSIS — E1165 Type 2 diabetes mellitus with hyperglycemia: Secondary | ICD-10-CM | POA: Diagnosis not present

## 2023-10-27 ENCOUNTER — Ambulatory Visit (HOSPITAL_BASED_OUTPATIENT_CLINIC_OR_DEPARTMENT_OTHER)

## 2023-10-27 ENCOUNTER — Encounter (HOSPITAL_BASED_OUTPATIENT_CLINIC_OR_DEPARTMENT_OTHER): Payer: Self-pay

## 2023-10-27 DIAGNOSIS — R2689 Other abnormalities of gait and mobility: Secondary | ICD-10-CM | POA: Diagnosis not present

## 2023-10-27 DIAGNOSIS — M5459 Other low back pain: Secondary | ICD-10-CM

## 2023-10-27 DIAGNOSIS — M6281 Muscle weakness (generalized): Secondary | ICD-10-CM | POA: Diagnosis not present

## 2023-10-27 NOTE — Therapy (Signed)
 OUTPATIENT PHYSICAL THERAPY THORACOLUMBAR NOTE   Patient Name: [name] MRN: [id number] DOB:01-Jul-1950, 73 y.o., female Today's Date: 10/27/2023    END OF SESSION:  PT End of Session - 10/27/23 1152     Visit Number 18    Number of Visits 40    Date for PT Re-Evaluation 11/28/23    Authorization Type [employer]    Authorization Time Period 20 visits approved  From 06.02.2025 - 08.02.2025    Progress Note Due on Visit 20    PT Start Time 1148    PT Stop Time 1229    PT Time Calculation (min) 41 min    Activity Tolerance Patient tolerated treatment well    Behavior During Therapy [facility] for tasks assessed/performed                  Past Medical History:  Diagnosis Date   Anginal pain (HCC) 03/19/2012   saw Dr. Calhoun .. she thinks its more related to stomach issues   Arthritis    all over; mostly in my back (03/20/2012)   Breast cancer (HCC)    right (03/20/2012)   Cardiomyopathy    Chest pain 06/2005   Hospitalized, dystolic dysfunction   Chronic lower back pain    have 2 herniated discs; going to have to have a fusion (03/20/2012)   CKD (chronic kidney disease), stage III (HCC)    Family history of anesthesia complication    father has nausea and vomiting   Gallstones    GERD (gastroesophageal reflux disease)    H/O hiatal hernia    History of kidney stones    History of right mastectomy    Hypertension    patient states never had HTN, takes Hyzaar for heart   Hypothyroidism    Irritable bowel syndrome    Migraines    it's been a long time since I've had one (03/20/2012)   Osteoarthritis    Peripheral neuropathy    PONV (postoperative nausea and vomiting)    Sciatica    Sinus bradycardia    Sleep apnea    mild, no CPAP   Type II diabetes mellitus (HCC)    Past Surgical History:  Procedure Laterality Date   ABDOMINAL HYSTERECTOMY  1993   adenosin cardiolite   07/2005   (+) wall motion abnormality   AXILLARY LYMPH NODE  DISSECTION  2003   squamous cell cancer   BACK SURGERY     CARDIAC CATHETERIZATION  ? 2005 / 2007   CHOLECYSTECTOMY N/A 01/24/2018   Procedure: LAPAROSCOPIC CHOLECYSTECTOMY;  Surgeon: Vanderbilt Ned, MD;  Location: [facility] OR;  Service: General;  Laterality: N/A;   CT abdomen and pelvis  02/2010   Same   ESOPHAGOGASTRODUODENOSCOPY  2005   GERD   KNEE ARTHROSCOPY  1990's   right (03/20/2012)   LUMBAR FUSION N/A 08/22/2014   Procedure: Right L2-3 and right L1-2 transforaminal lumbar interbody fusion with pedicle screws, rods, sleeves, cages, local bone graft, Vivigen, cancellous chips;  Surgeon: Lynwood FORBES Better, MD;  Location: [facility] OR;  Service: Orthopedics;  Laterality: N/A;   LUMBAR LAMINECTOMY/DECOMPRESSION MICRODISCECTOMY N/A 02/04/2014   Procedure: RIGHT L2-3 MICRODISCECTOMY ;  Surgeon: Lynwood FORBES Better, MD;  Location: [facility] OR;  Service: Orthopedics;  Laterality: N/A;   MASTECTOMY WITH AXILLARY LYMPH NODE DISSECTION  12/2003   right (03/20/2012)   PARATHYROIDECTOMY Left 10/11/2022   Procedure: MINIMALLY INVASIVE LEFT INFERIOR PARATHYROIDECTOMY;  Surgeon: Rubin Calamity, MD;  Location: [facility] OR;  Service: General;  Laterality: Left;  POSTERIOR CERVICAL FUSION/FORAMINOTOMY  2001   SHOULDER ARTHROSCOPY W/ ROTATOR CUFF REPAIR  2003   Left   THYROIDECTOMY  1977   Right   TOTAL KNEE ARTHROPLASTY  2000   Right   US  of abdomen  07/2005   Fatty liver / kidney stones   Patient Active Problem List   Diagnosis Date Noted   Urinary incontinence 08/09/2023   Metallic taste 05/11/2023   S/P parathyroidectomy 10/11/2022   Allergic rhinitis 07/25/2022   Syncope 07/21/2022   Sinus bradycardia 07/21/2022   Hammer toe of second toe of left foot 05/05/2021   Corns and callosities 05/05/2021   Hyperlipidemia associated with type 2 diabetes mellitus (HCC) 02/01/2021   Depression 02/01/2021   Localized osteoporosis without current pathological fracture 05/01/2020   Hyperparathyroidism (HCC) 12/13/2019    Elevated hemoglobin (HCC) 12/13/2019   Chronic renal impairment, stage 4 (severe) (HCC) 10/28/2019   Anemia 10/28/2019   Physical exam 06/26/2019   Neck pain 04/18/2019   Ascending aortic aneurysm (HCC) 04/17/2019   Nonspecific chest pain 04/15/2019   Neuropathic pain of right flank 12/27/2018   Dysphagia 12/27/2018   Postsurgical hypothyroidism 06/11/2018   Vitamin D  deficiency 06/11/2018   Multinodular goiter 06/11/2018   DM type 2 causing vascular disease (HCC)    Peripheral neuropathy    Migraine    Irritable bowel syndrome    Essential hypertension    History of right mastectomy    H/O hiatal hernia    GERD (gastroesophageal reflux disease)    Family history of anesthesia complication    Other chronic pain    Type 2 diabetes mellitus with diabetic chronic kidney disease (HCC)    Lower extremity edema    Chronic constipation 06/23/2015   Chronic diastolic heart failure (HCC) 11/17/2014   Herniated nucleus pulposus, L2-3 right 02/04/2014    Class: Acute   Spondylolisthesis of lumbar region 04/30/2012    Class: Chronic   Lumbar stenosis with neurogenic claudication 04/30/2012    Class: Chronic   SYNCOPE-CAROTID SINUS 09/16/2008   OSTEOARTHRITIS 08/23/2006   DDD (degenerative disc disease), cervical 08/23/2006   DISC DISEASE, LUMBAR 08/23/2006   Headache(784.0) 08/23/2006   BREAST CANCER, HX OF 08/23/2006   RENAL CALCULUS, HX OF 08/23/2006    PCP: Mahlon Comer BRAVO, MD   REFERRING PROVIDER: Georgina Ozell LABOR, MD  REFERRING DIAG: M54.50 (ICD-10-CM) - Low back pain, unspecified back pain laterality, unspecified chronicity, unspecified whether sciatica present  Rationale for Evaluation and Treatment: Rehabilitation  THERAPY DIAG:  Muscle weakness (generalized)  Other abnormalities of gait and mobility  Other low back pain  ONSET DATE: chronic   SUBJECTIVE:  SUBJECTIVE STATEMENT: Pt reports she tried to cook last night and had increased pain after. Back feels alright currently.   PERTINENT HISTORY:  Chronic LBP, hx Lumbar surgery, CKD, hx breast cancer, DM, neuropathy, osteoporosis, does have spinal cord stimulator  PAIN:  Are you having pain? Yes: NPRS scale: 0/10 Pain location: low back, abdomen  Pain description: achy Aggravating factors: walking/standing Relieving factors: laying down  PRECAUTIONS: None  WEIGHT BEARING RESTRICTIONS: No  FALLS:  Has patient fallen in last 6 months? No  PLOF: Independent  PATIENT GOALS: stand up and cook   OBJECTIVE: (objective measures from initial evaluation unless otherwise dated)  PATIENT SURVEYS: Modified Oswestry 30/50  5/20: 24/50 10/17/23: 18 / 50  SCREENING FOR RED FLAGS: Bowel or bladder incontinence: No Spinal tumors: No Cauda equina syndrome: No Compression fracture: No Abdominal aneurysm: No  COGNITION: Overall cognitive status: Within functional limits for tasks assessed     SENSATION: WFL  POSTURE: rounded shoulders, forward head, decreased lumbar lordosis, increased thoracic kyphosis, and flexed trunk   PALPATION: TTP lumbar and thoracic paraspinals with greatest tenderness throughout lumbar spine.  LUMBAR ROM:   AROM eval 5/20  Flexion 25% limited Full  Extension 90% limited * 40% limited  Right lateral flexion 75% limited * 25% limited*  Left lateral flexion 75% limited* 40% limited  Right rotation 50% limited* 15% limited  Left rotation 50% limited* Full   (Blank rows = not tested) * = pain/symptoms  LOWER EXTREMITY ROM:     Active  Right eval Left eval  Hip flexion    Hip extension    Hip abduction    Hip adduction    Hip internal rotation    Hip external rotation    Knee flexion    Knee extension    Ankle dorsiflexion    Ankle plantarflexion    Ankle inversion     Ankle eversion     (Blank rows = not tested) * = pain/symptoms  LOWER EXTREMITY MMT:  unable to test extension as patient unable to get into prone  MMT Right eval Left eval Right 5/20 Left 5/20 Right 10/17/23 Left 10/17/23  Hip flexion 4+ 4+ 4+ 4 4+ 4+  Hip extension        Hip abduction 4- 3+ 4+ (seated) 4-(seated) 4+ (seated) 4 + (seated)  Hip adduction        Hip internal rotation        Hip external rotation        Knee flexion 4+ 4+ 4+ 4 4+ 4+  Knee extension 4+ 4+ 4+ 4 4+ 4+  Ankle dorsiflexion 4+ 4+ 5 4+    Ankle plantarflexion        Ankle inversion        Ankle eversion         (Blank rows = not tested) * = pain/symptoms    FUNCTIONAL TESTS:  5 times sit to stand: 27.59 seconds with UE use 08/08/23:  22.5s with UE use 5/20: 20.03s without UE use (lowered plinth) 10/17/23: 24.58 seconds without UE use in chair  GAIT: Distance walked: 100 feet Assistive device utilized: Single point cane Level of assistance: Modified independence Comments: labored, decreased foot clearance  TODAY'S TREATMENT:  DATE:   10/27/23 -NuStep L6, UE/LE x 5 min  -standing hip abduction 2x10ea (cues for posture and orientation of hips) -standing hip extension 2x10ea -side stepping at rail x1 lap with cues -standing cone taps OH with cues for posture x5ea -hip hinge mechanics at wall x10 (extensive cuing) -Seated fwd flexion stretch 10 x5  Gait in hall with SPC x158ft -PNF chops with pink ball (cues for core engagement) x10ea -STS from elevated plinth 2x10  10/24/23 -NuStep L5, UE/LE x 5 min  -Seated fwd flexion stretch -Static cupping to lower thoracic and lumbar areas (Seated on stool leaning over pillow on plinth.) -PNF chops with pink ball (cues for core engagement) x10ea -STS from elevated plinth with GTB around thighs 2x10 -standing row with green band 2  x 20  10/20/23 -NuStep L5, UE/LE x 5 min  -Seated fwd flexion stretch -Static cupping to lower thoracic and lumbar areas -STM to upper, mid and lower back (Seated on stool leaning over pillow on plinth.) -Trunk rotation with GTB in standing (cues for posture) x10ea -PNF chops with pink ball (cues for core engagement) x10ea -STS from elevated plinth with GTB around thighs 2x5 -standing row with green band 2 x 10   10/17/23 Reassessment Discussion of symptoms and POC Step up 6 inch 2 x 10      PATIENT EDUCATION:  Education details: exercise rationale, progression/modification  Person educated: Patient Education method: Explanation, Demonstration, Education comprehension: verbalized understanding, returned demonstration, verbal cues required, and tactile cues required  HOME EXERCISE PROGRAM: Access Code: [facility] URL: [email or web address]   ASSESSMENT:  CLINICAL IMPRESSION:  Worked on progressing standing/walking activities today to build endurance and monitor posture. Pt did experience back tightness /pain after standing for prolonged periods and walking down the hall. This was relieved by seated breaks and lumbar flexion stretching. Worked on challenges to posture and core activation today as with standing cone reaches overhead without UE support. Reviewed posture and mechanics with cooking. Advised she take seated stretch breaks as needed when cooking.    OBJECTIVE IMPAIRMENTS: Abnormal gait, decreased activity tolerance, decreased balance, decreased endurance, decreased mobility, difficulty walking, decreased ROM, decreased strength, increased muscle spasms, impaired flexibility, impaired sensation, improper body mechanics, postural dysfunction, and pain.   ACTIVITY LIMITATIONS: carrying, lifting, bending, sitting, standing, squatting, stairs, transfers, reach over head, hygiene/grooming, locomotion level, and caring for others  PARTICIPATION LIMITATIONS: meal  prep, cleaning, laundry, shopping, community activity, and yard work  PERSONAL FACTORS: Fitness, Time since onset of injury/illness/exacerbation, and 3+ comorbidities: Chronic LBP, hx Lumbar surgery, CKD, hx breast cancer, DM, neuropathy, osteoporosis  are also affecting patient's functional outcome.   REHAB POTENTIAL: Good  CLINICAL DECISION MAKING: Evolving/moderate complexity  EVALUATION COMPLEXITY: Moderate   GOALS: Goals reviewed with patient? Yes  SHORT TERM GOALS: Target date: 08/11/2023    Patient will be independent with HEP in order to improve functional outcomes. Baseline:  Goal status: MET 5/20  2.  Patient will report at least 25% improvement in symptoms for improved quality of life. Baseline:  Goal status: MET 08/11/23   LONG TERM GOALS: Target date: 09/08/2023    Patient will report at least 75% improvement in symptoms for improved quality of life. Baseline:  Goal status: IN PROGRESS  2.  Patient will improve modified oswestry score by at least 8 points in order to indicate improved tolerance to activity. Baseline:  Goal status: MET (improved by 6 points 5/20)  3.  Patient will demonstrate at least 25% improvement in lumbar ROM  in all restricted planes for improved ability to move trunk while completing chores and cooking. Baseline:  Goal status: MET 5/20  4.  Patient will be able to complete 5x STS in under 15 seconds in order to demonstrate improved functional mobility/strength Baseline: see above Goal status: In progress - 5/20  5.  Patient will demonstrate grade of 5/5 MMT grade in all tested musculature as evidence of improved strength to assist with stair ambulation and gait.   Baseline:  Goal status: IN PROGRESS 5/20     PLAN:  PT FREQUENCY: 2x/week  PT DURATION: 8 weeks  PLANNED INTERVENTIONS: 97164- PT Re-evaluation, 97110-Therapeutic exercises, 97530- Therapeutic activity, 97112- Neuromuscular re-education, 97535- Self Care, [id number]- Manual  therapy, [phone]- Gait training, [phone]- Orthotic Fit/training, [phone]- Canalith repositioning, [id number]- Aquatic Therapy, [phone]- Splinting, Patient/Family education, Balance training, Stair training, Taping, Dry Needling, Joint mobilization, Joint manipulation, Spinal manipulation, Spinal mobilization, Scar mobilization, and DME instructions.  PLAN FOR NEXT SESSION: f/u with HEP, spinal mobility, core and glute and LE strength, functional strength    Asberry FORBES Rodes, PTA 10/27/2023, 3:36 PM  [facility] [address] [place], [place], [address] Phone: [phone]   Fax:  [phone]    Referring diagnosis? M54.50 (ICD-10-CM) - Low back pain, unspecified back pain laterality, unspecified chronicity, unspecified whether sciatica present Treatment diagnosis? (if different than referring diagnosis) m54.59 What was this (referring dx) caused by? []  Surgery []  Fall [x]  Ongoing issue []  Arthritis []  Other: ____________  Laterality: []  Rt []  Lt [x]  Both  Check all possible CPT codes:  *CHOOSE 10 OR LESS*    See Planned Interventions listed in the Plan section of the Evaluation.

## 2023-10-31 ENCOUNTER — Ambulatory Visit (HOSPITAL_BASED_OUTPATIENT_CLINIC_OR_DEPARTMENT_OTHER)

## 2023-10-31 ENCOUNTER — Encounter (HOSPITAL_BASED_OUTPATIENT_CLINIC_OR_DEPARTMENT_OTHER): Payer: Self-pay

## 2023-10-31 DIAGNOSIS — M5459 Other low back pain: Secondary | ICD-10-CM

## 2023-10-31 DIAGNOSIS — R2689 Other abnormalities of gait and mobility: Secondary | ICD-10-CM

## 2023-10-31 DIAGNOSIS — M6281 Muscle weakness (generalized): Secondary | ICD-10-CM

## 2023-10-31 NOTE — Therapy (Signed)
 OUTPATIENT PHYSICAL THERAPY THORACOLUMBAR NOTE   Patient Name: [name] MRN: [id number] DOB:13-Feb-1951, 73 y.o., female Today's Date: 10/31/2023    END OF SESSION:  PT End of Session - 10/31/23 1305     Visit Number 19    Number of Visits 40    Date for PT Re-Evaluation 11/28/23    Authorization Type [employer]    Authorization Time Period 20 visits approved  From 06.02.2025 - 08.02.2025    Progress Note Due on Visit 20    PT Start Time 1304    PT Stop Time 1345    PT Time Calculation (min) 41 min    Activity Tolerance Patient tolerated treatment well    Behavior During Therapy [facility] for tasks assessed/performed                   Past Medical History:  Diagnosis Date   Anginal pain (HCC) 03/19/2012   saw Dr. Calhoun .. she thinks its more related to stomach issues   Arthritis    all over; mostly in my back (03/20/2012)   Breast cancer (HCC)    right (03/20/2012)   Cardiomyopathy    Chest pain 06/2005   Hospitalized, dystolic dysfunction   Chronic lower back pain    have 2 herniated discs; going to have to have a fusion (03/20/2012)   CKD (chronic kidney disease), stage III (HCC)    Family history of anesthesia complication    father has nausea and vomiting   Gallstones    GERD (gastroesophageal reflux disease)    H/O hiatal hernia    History of kidney stones    History of right mastectomy    Hypertension    patient states never had HTN, takes Hyzaar for heart   Hypothyroidism    Irritable bowel syndrome    Migraines    it's been a long time since I've had one (03/20/2012)   Osteoarthritis    Peripheral neuropathy    PONV (postoperative nausea and vomiting)    Sciatica    Sinus bradycardia    Sleep apnea    mild, no CPAP   Type II diabetes mellitus (HCC)    Past Surgical History:  Procedure Laterality Date   ABDOMINAL HYSTERECTOMY  1993   adenosin cardiolite   07/2005   (+) wall motion abnormality   AXILLARY LYMPH NODE  DISSECTION  2003   squamous cell cancer   BACK SURGERY     CARDIAC CATHETERIZATION  ? 2005 / 2007   CHOLECYSTECTOMY N/A 01/24/2018   Procedure: LAPAROSCOPIC CHOLECYSTECTOMY;  Surgeon: Vanderbilt Ned, MD;  Location: [facility] OR;  Service: General;  Laterality: N/A;   CT abdomen and pelvis  02/2010   Same   ESOPHAGOGASTRODUODENOSCOPY  2005   GERD   KNEE ARTHROSCOPY  1990's   right (03/20/2012)   LUMBAR FUSION N/A 08/22/2014   Procedure: Right L2-3 and right L1-2 transforaminal lumbar interbody fusion with pedicle screws, rods, sleeves, cages, local bone graft, Vivigen, cancellous chips;  Surgeon: Lynwood FORBES Better, MD;  Location: [facility] OR;  Service: Orthopedics;  Laterality: N/A;   LUMBAR LAMINECTOMY/DECOMPRESSION MICRODISCECTOMY N/A 02/04/2014   Procedure: RIGHT L2-3 MICRODISCECTOMY ;  Surgeon: Lynwood FORBES Better, MD;  Location: [facility] OR;  Service: Orthopedics;  Laterality: N/A;   MASTECTOMY WITH AXILLARY LYMPH NODE DISSECTION  12/2003   right (03/20/2012)   PARATHYROIDECTOMY Left 10/11/2022   Procedure: MINIMALLY INVASIVE LEFT INFERIOR PARATHYROIDECTOMY;  Surgeon: Rubin Calamity, MD;  Location: [facility] OR;  Service: General;  Laterality: Left;  POSTERIOR CERVICAL FUSION/FORAMINOTOMY  2001   SHOULDER ARTHROSCOPY W/ ROTATOR CUFF REPAIR  2003   Left   THYROIDECTOMY  1977   Right   TOTAL KNEE ARTHROPLASTY  2000   Right   US  of abdomen  07/2005   Fatty liver / kidney stones   Patient Active Problem List   Diagnosis Date Noted   Urinary incontinence 08/09/2023   Metallic taste 05/11/2023   S/P parathyroidectomy 10/11/2022   Allergic rhinitis 07/25/2022   Syncope 07/21/2022   Sinus bradycardia 07/21/2022   Hammer toe of second toe of left foot 05/05/2021   Corns and callosities 05/05/2021   Hyperlipidemia associated with type 2 diabetes mellitus (HCC) 02/01/2021   Depression 02/01/2021   Localized osteoporosis without current pathological fracture 05/01/2020   Hyperparathyroidism (HCC) 12/13/2019    Elevated hemoglobin (HCC) 12/13/2019   Chronic renal impairment, stage 4 (severe) (HCC) 10/28/2019   Anemia 10/28/2019   Physical exam 06/26/2019   Neck pain 04/18/2019   Ascending aortic aneurysm (HCC) 04/17/2019   Nonspecific chest pain 04/15/2019   Neuropathic pain of right flank 12/27/2018   Dysphagia 12/27/2018   Postsurgical hypothyroidism 06/11/2018   Vitamin D  deficiency 06/11/2018   Multinodular goiter 06/11/2018   DM type 2 causing vascular disease (HCC)    Peripheral neuropathy    Migraine    Irritable bowel syndrome    Essential hypertension    History of right mastectomy    H/O hiatal hernia    GERD (gastroesophageal reflux disease)    Family history of anesthesia complication    Other chronic pain    Type 2 diabetes mellitus with diabetic chronic kidney disease (HCC)    Lower extremity edema    Chronic constipation 06/23/2015   Chronic diastolic heart failure (HCC) 11/17/2014   Herniated nucleus pulposus, L2-3 right 02/04/2014    Class: Acute   Spondylolisthesis of lumbar region 04/30/2012    Class: Chronic   Lumbar stenosis with neurogenic claudication 04/30/2012    Class: Chronic   SYNCOPE-CAROTID SINUS 09/16/2008   OSTEOARTHRITIS 08/23/2006   DDD (degenerative disc disease), cervical 08/23/2006   DISC DISEASE, LUMBAR 08/23/2006   Headache(784.0) 08/23/2006   BREAST CANCER, HX OF 08/23/2006   RENAL CALCULUS, HX OF 08/23/2006    PCP: Mahlon Comer BRAVO, MD   REFERRING PROVIDER: Georgina Ozell LABOR, MD  REFERRING DIAG: M54.50 (ICD-10-CM) - Low back pain, unspecified back pain laterality, unspecified chronicity, unspecified whether sciatica present  Rationale for Evaluation and Treatment: Rehabilitation  THERAPY DIAG:  Muscle weakness (generalized)  Other abnormalities of gait and mobility  Other low back pain  ONSET DATE: chronic   SUBJECTIVE:  SUBJECTIVE STATEMENT: Pt denies soreness after last session. 6/10 pain level in low back today. I don't think it's ever going to go away.  PERTINENT HISTORY:  Chronic LBP, hx Lumbar surgery, CKD, hx breast cancer, DM, neuropathy, osteoporosis, does have spinal cord stimulator  PAIN:  Are you having pain? Yes: NPRS scale: 6/10 Pain location: low back, abdomen  Pain description: achy Aggravating factors: walking/standing Relieving factors: laying down  PRECAUTIONS: None  WEIGHT BEARING RESTRICTIONS: No  FALLS:  Has patient fallen in last 6 months? No  PLOF: Independent  PATIENT GOALS: stand up and cook   OBJECTIVE: (objective measures from initial evaluation unless otherwise dated)  PATIENT SURVEYS: Modified Oswestry 30/50  5/20: 24/50 10/17/23: 18 / 50  SCREENING FOR RED FLAGS: Bowel or bladder incontinence: No Spinal tumors: No Cauda equina syndrome: No Compression fracture: No Abdominal aneurysm: No  COGNITION: Overall cognitive status: Within functional limits for tasks assessed     SENSATION: WFL  POSTURE: rounded shoulders, forward head, decreased lumbar lordosis, increased thoracic kyphosis, and flexed trunk   PALPATION: TTP lumbar and thoracic paraspinals with greatest tenderness throughout lumbar spine.  LUMBAR ROM:   AROM eval 5/20  Flexion 25% limited Full  Extension 90% limited * 40% limited  Right lateral flexion 75% limited * 25% limited*  Left lateral flexion 75% limited* 40% limited  Right rotation 50% limited* 15% limited  Left rotation 50% limited* Full   (Blank rows = not tested) * = pain/symptoms  LOWER EXTREMITY ROM:     Active  Right eval Left eval  Hip flexion    Hip extension    Hip abduction    Hip adduction    Hip internal rotation    Hip external rotation    Knee flexion    Knee extension    Ankle dorsiflexion    Ankle plantarflexion     Ankle inversion    Ankle eversion     (Blank rows = not tested) * = pain/symptoms  LOWER EXTREMITY MMT:  unable to test extension as patient unable to get into prone  MMT Right eval Left eval Right 5/20 Left 5/20 Right 10/17/23 Left 10/17/23  Hip flexion 4+ 4+ 4+ 4 4+ 4+  Hip extension        Hip abduction 4- 3+ 4+ (seated) 4-(seated) 4+ (seated) 4 + (seated)  Hip adduction        Hip internal rotation        Hip external rotation        Knee flexion 4+ 4+ 4+ 4 4+ 4+  Knee extension 4+ 4+ 4+ 4 4+ 4+  Ankle dorsiflexion 4+ 4+ 5 4+    Ankle plantarflexion        Ankle inversion        Ankle eversion         (Blank rows = not tested) * = pain/symptoms    FUNCTIONAL TESTS:  5 times sit to stand: 27.59 seconds with UE use 08/08/23:  22.5s with UE use 5/20: 20.03s without UE use (lowered plinth) 10/17/23: 24.58 seconds without UE use in chair  GAIT: Distance walked: 100 feet Assistive device utilized: Single point cane Level of assistance: Modified independence Comments: labored, decreased foot clearance  TODAY'S TREATMENT:  DATE:   10/31/23 -NuStep L6, UE/LE x 6 min  -standing hip abduction 2x10ea (cues for posture and orientation of hips) -standing hip extension 2x10ea -side stepping at rail x3 laps with cues -hip hinge mechanics at wall x20 (extensive cuing) -Seated fwd flexion stretch 10 x5  Gait in hall with SPC x168ft -PNF chops with pink ball (cues for core engagement) x10ea -STS from elevated plinth 2x10 -step ups 6 x10ea (contralateral rail use)  10/27/23 -NuStep L6, UE/LE x 5 min  -standing hip abduction 2x10ea (cues for posture and orientation of hips) -standing hip extension 2x10ea -side stepping at rail x1 lap with cues -standing cone taps OH with cues for posture x5ea -hip hinge mechanics at wall x10 (extensive cuing) -Seated fwd  flexion stretch 10 x5  Gait in hall with SPC x139ft -PNF chops with pink ball (cues for core engagement) x10ea -STS from elevated plinth 2x10  10/24/23 -NuStep L5, UE/LE x 5 min  -Seated fwd flexion stretch -Static cupping to lower thoracic and lumbar areas (Seated on stool leaning over pillow on plinth.) -PNF chops with pink ball (cues for core engagement) x10ea -STS from elevated plinth with GTB around thighs 2x10 -standing row with green band 2 x 20  10/20/23 -NuStep L5, UE/LE x 5 min  -Seated fwd flexion stretch -Static cupping to lower thoracic and lumbar areas -STM to upper, mid and lower back (Seated on stool leaning over pillow on plinth.) -Trunk rotation with GTB in standing (cues for posture) x10ea -PNF chops with pink ball (cues for core engagement) x10ea -STS from elevated plinth with GTB around thighs 2x5 -standing row with green band 2 x 10   10/17/23 Reassessment Discussion of symptoms and POC Step up 6 inch 2 x 10      PATIENT EDUCATION:  Education details: exercise rationale, progression/modification  Person educated: Patient Education method: Explanation, Demonstration, Education comprehension: verbalized understanding, returned demonstration, verbal cues required, and tactile cues required  HOME EXERCISE PROGRAM: Access Code: [facility] URL: [email or web address]   ASSESSMENT:  CLINICAL IMPRESSION:  Progressed with theract today with overall good tolerance. She did have some difficulty with step ups leading with L LE. Continued with seated lumbar stretching to address tightness in low back. She was able to complete increased repetitions with side stepping today, requiring cues for proper hip alignment. Improving tolerance for ambulation in clinic with SPC. Will continue to progress as tolerated to improve strength and endurance.    OBJECTIVE IMPAIRMENTS: Abnormal gait, decreased activity tolerance, decreased balance, decreased endurance,  decreased mobility, difficulty walking, decreased ROM, decreased strength, increased muscle spasms, impaired flexibility, impaired sensation, improper body mechanics, postural dysfunction, and pain.   ACTIVITY LIMITATIONS: carrying, lifting, bending, sitting, standing, squatting, stairs, transfers, reach over head, hygiene/grooming, locomotion level, and caring for others  PARTICIPATION LIMITATIONS: meal prep, cleaning, laundry, shopping, community activity, and yard work  PERSONAL FACTORS: Fitness, Time since onset of injury/illness/exacerbation, and 3+ comorbidities: Chronic LBP, hx Lumbar surgery, CKD, hx breast cancer, DM, neuropathy, osteoporosis  are also affecting patient's functional outcome.   REHAB POTENTIAL: Good  CLINICAL DECISION MAKING: Evolving/moderate complexity  EVALUATION COMPLEXITY: Moderate   GOALS: Goals reviewed with patient? Yes  SHORT TERM GOALS: Target date: 08/11/2023    Patient will be independent with HEP in order to improve functional outcomes. Baseline:  Goal status: MET 5/20  2.  Patient will report at least 25% improvement in symptoms for improved quality of life. Baseline:  Goal status: MET 08/11/23   LONG TERM GOALS: Target  date: 09/08/2023    Patient will report at least 75% improvement in symptoms for improved quality of life. Baseline:  Goal status: IN PROGRESS  2.  Patient will improve modified oswestry score by at least 8 points in order to indicate improved tolerance to activity. Baseline:  Goal status: MET (improved by 6 points 5/20)  3.  Patient will demonstrate at least 25% improvement in lumbar ROM in all restricted planes for improved ability to move trunk while completing chores and cooking. Baseline:  Goal status: MET 5/20  4.  Patient will be able to complete 5x STS in under 15 seconds in order to demonstrate improved functional mobility/strength Baseline: see above Goal status: In progress - 5/20  5.  Patient will  demonstrate grade of 5/5 MMT grade in all tested musculature as evidence of improved strength to assist with stair ambulation and gait.   Baseline:  Goal status: IN PROGRESS 5/20     PLAN:  PT FREQUENCY: 2x/week  PT DURATION: 8 weeks  PLANNED INTERVENTIONS: 97164- PT Re-evaluation, 97110-Therapeutic exercises, 97530- Therapeutic activity, 97112- Neuromuscular re-education, 97535- Self Care, [id number]- Manual therapy, [phone]- Gait training, [phone]- Orthotic Fit/training, [phone]- Canalith repositioning, [id number]- Aquatic Therapy, [phone]- Splinting, Patient/Family education, Balance training, Stair training, Taping, Dry Needling, Joint mobilization, Joint manipulation, Spinal manipulation, Spinal mobilization, Scar mobilization, and DME instructions.  PLAN FOR NEXT SESSION: f/u with HEP, spinal mobility, core and glute and LE strength, functional strength    Asberry FORBES Rodes, PTA 10/31/2023, 2:18 PM  [facility] [address] [place], [place], [address] Phone: [phone]   Fax:  [phone]    Referring diagnosis? M54.50 (ICD-10-CM) - Low back pain, unspecified back pain laterality, unspecified chronicity, unspecified whether sciatica present Treatment diagnosis? (if different than referring diagnosis) m54.59 What was this (referring dx) caused by? []  Surgery []  Fall [x]  Ongoing issue []  Arthritis []  Other: ____________  Laterality: []  Rt []  Lt [x]  Both  Check all possible CPT codes:  *CHOOSE 10 OR LESS*    See Planned Interventions listed in the Plan section of the Evaluation.

## 2023-11-01 DIAGNOSIS — E113292 Type 2 diabetes mellitus with mild nonproliferative diabetic retinopathy without macular edema, left eye: Secondary | ICD-10-CM | POA: Diagnosis not present

## 2023-11-01 LAB — OPHTHALMOLOGY REPORT-SCANNED

## 2023-11-02 ENCOUNTER — Ambulatory Visit (HOSPITAL_BASED_OUTPATIENT_CLINIC_OR_DEPARTMENT_OTHER)

## 2023-11-02 ENCOUNTER — Encounter (HOSPITAL_BASED_OUTPATIENT_CLINIC_OR_DEPARTMENT_OTHER): Payer: Self-pay

## 2023-11-02 DIAGNOSIS — M5459 Other low back pain: Secondary | ICD-10-CM

## 2023-11-02 DIAGNOSIS — R2689 Other abnormalities of gait and mobility: Secondary | ICD-10-CM | POA: Diagnosis not present

## 2023-11-02 DIAGNOSIS — M6281 Muscle weakness (generalized): Secondary | ICD-10-CM

## 2023-11-02 NOTE — Therapy (Signed)
 OUTPATIENT PHYSICAL THERAPY THORACOLUMBAR NOTE      Patient Name: [name] MRN: [id number] DOB:1950/07/01, 73 y.o., female Today's Date: 11/02/2023    END OF SESSION:  PT End of Session - 11/02/23 1304     Visit Number 20    Number of Visits 40    Date for PT Re-Evaluation 11/28/23    Authorization Type [employer]    Authorization Time Period 20 visits approved  From 06.02.2025 - 08.02.2025    Authorization - Visit Number 7    Authorization - Number of Visits 20    Progress Note Due on Visit 25    PT Start Time 1302    PT Stop Time 1345    PT Time Calculation (min) 43 min    Activity Tolerance Patient tolerated treatment well    Behavior During Therapy [facility] for tasks assessed/performed                    Past Medical History:  Diagnosis Date   Anginal pain (HCC) 03/19/2012   saw Dr. Calhoun .. she thinks its more related to stomach issues   Arthritis    all over; mostly in my back (03/20/2012)   Breast cancer (HCC)    right (03/20/2012)   Cardiomyopathy    Chest pain 06/2005   Hospitalized, dystolic dysfunction   Chronic lower back pain    have 2 herniated discs; going to have to have a fusion (03/20/2012)   CKD (chronic kidney disease), stage III (HCC)    Family history of anesthesia complication    father has nausea and vomiting   Gallstones    GERD (gastroesophageal reflux disease)    H/O hiatal hernia    History of kidney stones    History of right mastectomy    Hypertension    patient states never had HTN, takes Hyzaar for heart   Hypothyroidism    Irritable bowel syndrome    Migraines    it's been a long time since I've had one (03/20/2012)   Osteoarthritis    Peripheral neuropathy    PONV (postoperative nausea and vomiting)    Sciatica    Sinus bradycardia    Sleep apnea    mild, no CPAP   Type II diabetes mellitus (HCC)    Past Surgical History:  Procedure Laterality Date   ABDOMINAL HYSTERECTOMY  1993    adenosin cardiolite   07/2005   (+) wall motion abnormality   AXILLARY LYMPH NODE DISSECTION  2003   squamous cell cancer   BACK SURGERY     CARDIAC CATHETERIZATION  ? 2005 / 2007   CHOLECYSTECTOMY N/A 01/24/2018   Procedure: LAPAROSCOPIC CHOLECYSTECTOMY;  Surgeon: Vanderbilt Ned, MD;  Location: [facility] OR;  Service: General;  Laterality: N/A;   CT abdomen and pelvis  02/2010   Same   ESOPHAGOGASTRODUODENOSCOPY  2005   GERD   KNEE ARTHROSCOPY  1990's   right (03/20/2012)   LUMBAR FUSION N/A 08/22/2014   Procedure: Right L2-3 and right L1-2 transforaminal lumbar interbody fusion with pedicle screws, rods, sleeves, cages, local bone graft, Vivigen, cancellous chips;  Surgeon: Lynwood FORBES Better, MD;  Location: [facility] OR;  Service: Orthopedics;  Laterality: N/A;   LUMBAR LAMINECTOMY/DECOMPRESSION MICRODISCECTOMY N/A 02/04/2014   Procedure: RIGHT L2-3 MICRODISCECTOMY ;  Surgeon: Lynwood FORBES Better, MD;  Location: [facility] OR;  Service: Orthopedics;  Laterality: N/A;   MASTECTOMY WITH AXILLARY LYMPH NODE DISSECTION  12/2003   right (03/20/2012)   PARATHYROIDECTOMY Left 10/11/2022  Procedure: MINIMALLY INVASIVE LEFT INFERIOR PARATHYROIDECTOMY;  Surgeon: Rubin Calamity, MD;  Location: [facility] OR;  Service: General;  Laterality: Left;   POSTERIOR CERVICAL FUSION/FORAMINOTOMY  2001   SHOULDER ARTHROSCOPY W/ ROTATOR CUFF REPAIR  2003   Left   THYROIDECTOMY  1977   Right   TOTAL KNEE ARTHROPLASTY  2000   Right   US  of abdomen  07/2005   Fatty liver / kidney stones   Patient Active Problem List   Diagnosis Date Noted   Urinary incontinence 08/09/2023   Metallic taste 05/11/2023   S/P parathyroidectomy 10/11/2022   Allergic rhinitis 07/25/2022   Syncope 07/21/2022   Sinus bradycardia 07/21/2022   Hammer toe of second toe of left foot 05/05/2021   Corns and callosities 05/05/2021   Hyperlipidemia associated with type 2 diabetes mellitus (HCC) 02/01/2021   Depression 02/01/2021   Localized osteoporosis without  current pathological fracture 05/01/2020   Hyperparathyroidism (HCC) 12/13/2019   Elevated hemoglobin (HCC) 12/13/2019   Chronic renal impairment, stage 4 (severe) (HCC) 10/28/2019   Anemia 10/28/2019   Physical exam 06/26/2019   Neck pain 04/18/2019   Ascending aortic aneurysm (HCC) 04/17/2019   Nonspecific chest pain 04/15/2019   Neuropathic pain of right flank 12/27/2018   Dysphagia 12/27/2018   Postsurgical hypothyroidism 06/11/2018   Vitamin D  deficiency 06/11/2018   Multinodular goiter 06/11/2018   DM type 2 causing vascular disease (HCC)    Peripheral neuropathy    Migraine    Irritable bowel syndrome    Essential hypertension    History of right mastectomy    H/O hiatal hernia    GERD (gastroesophageal reflux disease)    Family history of anesthesia complication    Other chronic pain    Type 2 diabetes mellitus with diabetic chronic kidney disease (HCC)    Lower extremity edema    Chronic constipation 06/23/2015   Chronic diastolic heart failure (HCC) 11/17/2014   Herniated nucleus pulposus, L2-3 right 02/04/2014    Class: Acute   Spondylolisthesis of lumbar region 04/30/2012    Class: Chronic   Lumbar stenosis with neurogenic claudication 04/30/2012    Class: Chronic   SYNCOPE-CAROTID SINUS 09/16/2008   OSTEOARTHRITIS 08/23/2006   DDD (degenerative disc disease), cervical 08/23/2006   DISC DISEASE, LUMBAR 08/23/2006   Headache(784.0) 08/23/2006   BREAST CANCER, HX OF 08/23/2006   RENAL CALCULUS, HX OF 08/23/2006    PCP: Mahlon Comer BRAVO, MD   REFERRING PROVIDER: Georgina Ozell LABOR, MD  REFERRING DIAG: M54.50 (ICD-10-CM) - Low back pain, unspecified back pain laterality, unspecified chronicity, unspecified whether sciatica present  Rationale for Evaluation and Treatment: Rehabilitation  THERAPY DIAG:  Muscle weakness (generalized)  Other abnormalities of gait and mobility  Other low back pain  ONSET DATE: chronic   SUBJECTIVE:  SUBJECTIVE STATEMENT: Pt reports 5/10 pain level in R hip and low back. The hip has been hurting and woke me up this morning! Pt reports some soreness in abdominal muscles after last session.   PERTINENT HISTORY:  Chronic LBP, hx Lumbar surgery, CKD, hx breast cancer, DM, neuropathy, osteoporosis, does have spinal cord stimulator  PAIN:  Are you having pain? Yes: NPRS scale: 5/10 Pain location: low back, abdomen  Pain description: achy Aggravating factors: walking/standing Relieving factors: laying down  PRECAUTIONS: None  WEIGHT BEARING RESTRICTIONS: No  FALLS:  Has patient fallen in last 6 months? No  PLOF: Independent  PATIENT GOALS: stand up and cook   OBJECTIVE: (objective measures from initial evaluation unless otherwise dated)  PATIENT SURVEYS: Modified Oswestry 30/50  5/20: 24/50 10/17/23: 18 / 50 7/24:  SCREENING FOR RED FLAGS: Bowel or bladder incontinence: No Spinal tumors: No Cauda equina syndrome: No Compression fracture: No Abdominal aneurysm: No  COGNITION: Overall cognitive status: Within functional limits for tasks assessed     SENSATION: WFL  POSTURE: rounded shoulders, forward head, decreased lumbar lordosis, increased thoracic kyphosis, and flexed trunk   PALPATION: TTP lumbar and thoracic paraspinals with greatest tenderness throughout lumbar spine.  LUMBAR ROM:   AROM eval 5/20  Flexion 25% limited Full  Extension 90% limited * 40% limited  Right lateral flexion 75% limited * 25% limited*  Left lateral flexion 75% limited* 40% limited  Right rotation 50% limited* 15% limited  Left rotation 50% limited* Full   (Blank rows = not tested) * = pain/symptoms  LOWER EXTREMITY ROM:     Active  Right eval Left eval  Hip flexion    Hip extension    Hip abduction    Hip  adduction    Hip internal rotation    Hip external rotation    Knee flexion    Knee extension    Ankle dorsiflexion    Ankle plantarflexion    Ankle inversion    Ankle eversion     (Blank rows = not tested) * = pain/symptoms  LOWER EXTREMITY MMT:  unable to test extension as patient unable to get into prone  MMT Right eval Left eval Right 5/20 Left 5/20 Right 10/17/23 Left 10/17/23  Hip flexion 4+ 4+ 4+ 4 4+ 4+  Hip extension        Hip abduction 4- 3+ 4+ (seated) 4-(seated) 4+ (seated) 4 + (seated)  Hip adduction        Hip internal rotation        Hip external rotation        Knee flexion 4+ 4+ 4+ 4 4+ 4+  Knee extension 4+ 4+ 4+ 4 4+ 4+  Ankle dorsiflexion 4+ 4+ 5 4+    Ankle plantarflexion        Ankle inversion        Ankle eversion         (Blank rows = not tested) * = pain/symptoms    FUNCTIONAL TESTS:  5 times sit to stand: 27.59 seconds with UE use 08/08/23:  22.5s with UE use 5/20: 20.03s without UE use (lowered plinth) 10/17/23: 24.58 seconds without UE use in chair  GAIT: Distance walked: 100 feet Assistive device utilized: Single point cane Level of assistance: Modified independence Comments: labored, decreased foot clearance  TODAY'S TREATMENT:  DATE:   11/02/23 -NuStep L6, UE/LE x 6 min  -SKTC 10sec x5ea -LTR x10ea -supine marching with TrA -DKTC with LES on physioball x16min -Seated clam with GTB x30 -Seated adductor sqz 5 2x10 -Seated assisted partial sit up using pink ball 2x10 -Seated fwd flexion stretch 10 x5  Gait in hall with SPC x141ft -PNF chops with pink ball (cues for core engagement) x10ea -STS from elevated plinth x10   10/31/23 -NuStep L6, UE/LE x 6 min  -standing hip abduction 2x10ea (cues for posture and orientation of hips) -standing hip extension 2x10ea -side stepping at rail x3 laps with cues -hip  hinge mechanics at wall x20 (extensive cuing) -Seated fwd flexion stretch 10 x5  Gait in hall with SPC x176ft -PNF chops with pink ball (cues for core engagement) x10ea -STS from elevated plinth 2x10 -step ups 6 x10ea (contralateral rail use)  10/27/23 -NuStep L6, UE/LE x 5 min  -standing hip abduction 2x10ea (cues for posture and orientation of hips) -standing hip extension 2x10ea -side stepping at rail x1 lap with cues -standing cone taps OH with cues for posture x5ea -hip hinge mechanics at wall x10 (extensive cuing) -Seated fwd flexion stretch 10 x5  Gait in hall with SPC x140ft -PNF chops with pink ball (cues for core engagement) x10ea -STS from elevated plinth 2x10  10/24/23 -NuStep L5, UE/LE x 5 min  -Seated fwd flexion stretch -Static cupping to lower thoracic and lumbar areas (Seated on stool leaning over pillow on plinth.) -PNF chops with pink ball (cues for core engagement) x10ea -STS from elevated plinth with GTB around thighs 2x10 -standing row with green band 2 x 20  10/20/23 -NuStep L5, UE/LE x 5 min  -Seated fwd flexion stretch -Static cupping to lower thoracic and lumbar areas -STM to upper, mid and lower back (Seated on stool leaning over pillow on plinth.) -Trunk rotation with GTB in standing (cues for posture) x10ea -PNF chops with pink ball (cues for core engagement) x10ea -STS from elevated plinth with GTB around thighs 2x5 -standing row with green band 2 x 10   10/17/23 Reassessment Discussion of symptoms and POC Step up 6 inch 2 x 10      PATIENT EDUCATION:  Education details: exercise rationale, progression/modification  Person educated: Patient Education method: Explanation, Demonstration, Education comprehension: verbalized understanding, returned demonstration, verbal cues required, and tactile cues required  HOME EXERCISE PROGRAM: Access Code: [facility] URL: [email or web address]   ASSESSMENT:  CLINICAL  IMPRESSION:  Pt limited by hip pain today so held step ups and decreased squatting activities. Worked on core strengthening/NMR, gentle hip strengthening, and ROM with core activation. No complaints of increased pain throughout session. She felt decreased hip pain by end of session.    OBJECTIVE IMPAIRMENTS: Abnormal gait, decreased activity tolerance, decreased balance, decreased endurance, decreased mobility, difficulty walking, decreased ROM, decreased strength, increased muscle spasms, impaired flexibility, impaired sensation, improper body mechanics, postural dysfunction, and pain.   ACTIVITY LIMITATIONS: carrying, lifting, bending, sitting, standing, squatting, stairs, transfers, reach over head, hygiene/grooming, locomotion level, and caring for others  PARTICIPATION LIMITATIONS: meal prep, cleaning, laundry, shopping, community activity, and yard work  PERSONAL FACTORS: Fitness, Time since onset of injury/illness/exacerbation, and 3+ comorbidities: Chronic LBP, hx Lumbar surgery, CKD, hx breast cancer, DM, neuropathy, osteoporosis  are also affecting patient's functional outcome.   REHAB POTENTIAL: Good  CLINICAL DECISION MAKING: Evolving/moderate complexity  EVALUATION COMPLEXITY: Moderate   GOALS: Goals reviewed with patient? Yes  SHORT TERM GOALS: Target date: 08/11/2023  Patient will be independent with HEP in order to improve functional outcomes. Baseline:  Goal status: MET 5/20  2.  Patient will report at least 25% improvement in symptoms for improved quality of life. Baseline:  Goal status: MET 08/11/23   LONG TERM GOALS: Target date: 09/08/2023    Patient will report at least 75% improvement in symptoms for improved quality of life. Baseline:  Goal status: IN PROGRESS  2.  Patient will improve modified oswestry score by at least 8 points in order to indicate improved tolerance to activity. Baseline:  Goal status: MET (improved by 6 points 5/20)  3.  Patient  will demonstrate at least 25% improvement in lumbar ROM in all restricted planes for improved ability to move trunk while completing chores and cooking. Baseline:  Goal status: MET 5/20  4.  Patient will be able to complete 5x STS in under 15 seconds in order to demonstrate improved functional mobility/strength Baseline: see above Goal status: In progress - 5/20  5.  Patient will demonstrate grade of 5/5 MMT grade in all tested musculature as evidence of improved strength to assist with stair ambulation and gait.   Baseline:  Goal status: IN PROGRESS 5/20     PLAN:  PT FREQUENCY: 2x/week  PT DURATION: 8 weeks  PLANNED INTERVENTIONS: 97164- PT Re-evaluation, 97110-Therapeutic exercises, 97530- Therapeutic activity, 97112- Neuromuscular re-education, 97535- Self Care, [id number]- Manual therapy, [phone]- Gait training, [phone]- Orthotic Fit/training, [phone]- Canalith repositioning, [id number]- Aquatic Therapy, [phone]- Splinting, Patient/Family education, Balance training, Stair training, Taping, Dry Needling, Joint mobilization, Joint manipulation, Spinal manipulation, Spinal mobilization, Scar mobilization, and DME instructions.  PLAN FOR NEXT SESSION: f/u with HEP, spinal mobility, core and glute and LE strength, functional strength    Asberry FORBES Rodes, PTA 11/02/2023, 2:26 PM  [facility] [address] [place], [place], [address] Phone: [phone]   Fax:  [phone]    Referring diagnosis? M54.50 (ICD-10-CM) - Low back pain, unspecified back pain laterality, unspecified chronicity, unspecified whether sciatica present Treatment diagnosis? (if different than referring diagnosis) m54.59 What was this (referring dx) caused by? []  Surgery []  Fall [x]  Ongoing issue []  Arthritis []  Other: ____________  Laterality: []  Rt []  Lt [x]  Both  Check all possible CPT codes:  *CHOOSE 10 OR LESS*    See Planned Interventions listed in the Plan  section of the Evaluation.

## 2023-11-07 ENCOUNTER — Encounter (HOSPITAL_BASED_OUTPATIENT_CLINIC_OR_DEPARTMENT_OTHER): Payer: Self-pay

## 2023-11-07 ENCOUNTER — Telehealth: Payer: Self-pay

## 2023-11-07 ENCOUNTER — Ambulatory Visit (HOSPITAL_BASED_OUTPATIENT_CLINIC_OR_DEPARTMENT_OTHER)

## 2023-11-07 DIAGNOSIS — M5459 Other low back pain: Secondary | ICD-10-CM

## 2023-11-07 DIAGNOSIS — M6281 Muscle weakness (generalized): Secondary | ICD-10-CM | POA: Diagnosis not present

## 2023-11-07 DIAGNOSIS — R2689 Other abnormalities of gait and mobility: Secondary | ICD-10-CM

## 2023-11-07 NOTE — Telephone Encounter (Signed)
 Patient was identified as falling into the [place] Measure - Diabetes.   Patient was: Appointment already scheduled for:  01/11/2024.  Patient saw PCP on 07/12/23 for physical and was instructed to follow up in 6 months and has that appt scheduled already. Last A1C was in May 2025

## 2023-11-07 NOTE — Therapy (Signed)
 OUTPATIENT PHYSICAL THERAPY THORACOLUMBAR NOTE      Patient Name: [name] MRN: [id number] DOB:[birth year], 73 y.o., female Today's Date: 11/07/2023    END OF SESSION:  PT End of Session - 11/07/23 1055     Visit Number 21    Number of Visits 40    Date for PT Re-Evaluation 11/28/23    Authorization Type [employer]    Authorization Time Period 20 visits approved  From 06.02.2025 - 08.02.2025    Authorization - Visit Number 8    Authorization - Number of Visits 20    Progress Note Due on Visit 25    PT Start Time 1101    PT Stop Time 1145    PT Time Calculation (min) 44 min    Activity Tolerance Patient tolerated treatment well    Behavior During Therapy [facility] for tasks assessed/performed                     Past Medical History:  Diagnosis Date   Anginal pain (HCC) 03/19/2012   saw Dr. Calhoun .. she thinks its more related to stomach issues   Arthritis    all over; mostly in my back (03/20/2012)   Breast cancer (HCC)    right (03/20/2012)   Cardiomyopathy    Chest pain 06/2005   Hospitalized, dystolic dysfunction   Chronic lower back pain    have 2 herniated discs; going to have to have a fusion (03/20/2012)   CKD (chronic kidney disease), stage III (HCC)    Family history of anesthesia complication    father has nausea and vomiting   Gallstones    GERD (gastroesophageal reflux disease)    H/O hiatal hernia    History of kidney stones    History of right mastectomy    Hypertension    patient states never had HTN, takes Hyzaar for heart   Hypothyroidism    Irritable bowel syndrome    Migraines    it's been a long time since I've had one (03/20/2012)   Osteoarthritis    Peripheral neuropathy    PONV (postoperative nausea and vomiting)    Sciatica    Sinus bradycardia    Sleep apnea    mild, no CPAP   Type II diabetes mellitus (HCC)    Past Surgical History:  Procedure Laterality Date   ABDOMINAL HYSTERECTOMY  1993    adenosin cardiolite   07/2005   (+) wall motion abnormality   AXILLARY LYMPH NODE DISSECTION  2003   squamous cell cancer   BACK SURGERY     CARDIAC CATHETERIZATION  ? 2005 / 2007   CHOLECYSTECTOMY N/A 01/24/2018   Procedure: LAPAROSCOPIC CHOLECYSTECTOMY;  Surgeon: Vanderbilt Ned, MD;  Location: [facility] OR;  Service: General;  Laterality: N/A;   CT abdomen and pelvis  02/2010   Same   ESOPHAGOGASTRODUODENOSCOPY  2005   GERD   KNEE ARTHROSCOPY  1990's   right (03/20/2012)   LUMBAR FUSION N/A 08/22/2014   Procedure: Right L2-3 and right L1-2 transforaminal lumbar interbody fusion with pedicle screws, rods, sleeves, cages, local bone graft, Vivigen, cancellous chips;  Surgeon: Lynwood FORBES Better, MD;  Location: [facility] OR;  Service: Orthopedics;  Laterality: N/A;   LUMBAR LAMINECTOMY/DECOMPRESSION MICRODISCECTOMY N/A 02/04/2014   Procedure: RIGHT L2-3 MICRODISCECTOMY ;  Surgeon: Lynwood FORBES Better, MD;  Location: [facility] OR;  Service: Orthopedics;  Laterality: N/A;   MASTECTOMY WITH AXILLARY LYMPH NODE DISSECTION  12/2003   right (03/20/2012)   PARATHYROIDECTOMY Left 10/11/2022  Procedure: MINIMALLY INVASIVE LEFT INFERIOR PARATHYROIDECTOMY;  Surgeon: Rubin Calamity, MD;  Location: [facility] OR;  Service: General;  Laterality: Left;   POSTERIOR CERVICAL FUSION/FORAMINOTOMY  2001   SHOULDER ARTHROSCOPY W/ ROTATOR CUFF REPAIR  2003   Left   THYROIDECTOMY  1977   Right   TOTAL KNEE ARTHROPLASTY  2000   Right   US  of abdomen  07/2005   Fatty liver / kidney stones   Patient Active Problem List   Diagnosis Date Noted   Urinary incontinence 08/09/2023   Metallic taste 05/11/2023   S/P parathyroidectomy 10/11/2022   Allergic rhinitis 07/25/2022   Syncope 07/21/2022   Sinus bradycardia 07/21/2022   Hammer toe of second toe of left foot 05/05/2021   Corns and callosities 05/05/2021   Hyperlipidemia associated with type 2 diabetes mellitus (HCC) 02/01/2021   Depression 02/01/2021   Localized osteoporosis without  current pathological fracture 05/01/2020   Hyperparathyroidism (HCC) 12/13/2019   Elevated hemoglobin (HCC) 12/13/2019   Chronic renal impairment, stage 4 (severe) (HCC) 10/28/2019   Anemia 10/28/2019   Physical exam 06/26/2019   Neck pain 04/18/2019   Ascending aortic aneurysm (HCC) 04/17/2019   Nonspecific chest pain 04/15/2019   Neuropathic pain of right flank 12/27/2018   Dysphagia 12/27/2018   Postsurgical hypothyroidism 06/11/2018   Vitamin D  deficiency 06/11/2018   Multinodular goiter 06/11/2018   DM type 2 causing vascular disease (HCC)    Peripheral neuropathy    Migraine    Irritable bowel syndrome    Essential hypertension    History of right mastectomy    H/O hiatal hernia    GERD (gastroesophageal reflux disease)    Family history of anesthesia complication    Other chronic pain    Type 2 diabetes mellitus with diabetic chronic kidney disease (HCC)    Lower extremity edema    Chronic constipation 06/23/2015   Chronic diastolic heart failure (HCC) 11/17/2014   Herniated nucleus pulposus, L2-3 right 02/04/2014    Class: Acute   Spondylolisthesis of lumbar region 04/30/2012    Class: Chronic   Lumbar stenosis with neurogenic claudication 04/30/2012    Class: Chronic   SYNCOPE-CAROTID SINUS 09/16/2008   OSTEOARTHRITIS 08/23/2006   DDD (degenerative disc disease), cervical 08/23/2006   DISC DISEASE, LUMBAR 08/23/2006   Headache(784.0) 08/23/2006   BREAST CANCER, HX OF 08/23/2006   RENAL CALCULUS, HX OF 08/23/2006    PCP: Mahlon Comer BRAVO, MD   REFERRING PROVIDER: Georgina Ozell LABOR, MD  REFERRING DIAG: M54.50 (ICD-10-CM) - Low back pain, unspecified back pain laterality, unspecified chronicity, unspecified whether sciatica present  Rationale for Evaluation and Treatment: Rehabilitation  THERAPY DIAG:  Muscle weakness (generalized)  Other abnormalities of gait and mobility  Other low back pain  ONSET DATE: chronic   SUBJECTIVE:  SUBJECTIVE STATEMENT: Pt reports walking is getting a little bit easier. Hip pain is better, though mild discomfort still present.   PERTINENT HISTORY:  Chronic LBP, hx Lumbar surgery, CKD, hx breast cancer, DM, neuropathy, osteoporosis, does have spinal cord stimulator  PAIN:  Are you having pain? Yes: NPRS scale: 5/10 Pain location: low back, abdomen  Pain description: achy Aggravating factors: walking/standing Relieving factors: laying down  PRECAUTIONS: None  WEIGHT BEARING RESTRICTIONS: No  FALLS:  Has patient fallen in last 6 months? No  PLOF: Independent  PATIENT GOALS: stand up and cook   OBJECTIVE: (objective measures from initial evaluation unless otherwise dated)  PATIENT SURVEYS: Modified Oswestry 30/50  5/20: 24/50 10/17/23: 18 / 50 7/24:  SCREENING FOR RED FLAGS: Bowel or bladder incontinence: No Spinal tumors: No Cauda equina syndrome: No Compression fracture: No Abdominal aneurysm: No  COGNITION: Overall cognitive status: Within functional limits for tasks assessed     SENSATION: WFL  POSTURE: rounded shoulders, forward head, decreased lumbar lordosis, increased thoracic kyphosis, and flexed trunk   PALPATION: TTP lumbar and thoracic paraspinals with greatest tenderness throughout lumbar spine.  LUMBAR ROM:   AROM eval 5/20  Flexion 25% limited Full  Extension 90% limited * 40% limited  Right lateral flexion 75% limited * 25% limited*  Left lateral flexion 75% limited* 40% limited  Right rotation 50% limited* 15% limited  Left rotation 50% limited* Full   (Blank rows = not tested) * = pain/symptoms  LOWER EXTREMITY ROM:     Active  Right eval Left eval  Hip flexion    Hip extension    Hip abduction    Hip adduction    Hip internal rotation    Hip external rotation     Knee flexion    Knee extension    Ankle dorsiflexion    Ankle plantarflexion    Ankle inversion    Ankle eversion     (Blank rows = not tested) * = pain/symptoms  LOWER EXTREMITY MMT:  unable to test extension as patient unable to get into prone  MMT Right eval Left eval Right 5/20 Left 5/20 Right 10/17/23 Left 10/17/23  Hip flexion 4+ 4+ 4+ 4 4+ 4+  Hip extension        Hip abduction 4- 3+ 4+ (seated) 4-(seated) 4+ (seated) 4 + (seated)  Hip adduction        Hip internal rotation        Hip external rotation        Knee flexion 4+ 4+ 4+ 4 4+ 4+  Knee extension 4+ 4+ 4+ 4 4+ 4+  Ankle dorsiflexion 4+ 4+ 5 4+    Ankle plantarflexion        Ankle inversion        Ankle eversion         (Blank rows = not tested) * = pain/symptoms    FUNCTIONAL TESTS:  5 times sit to stand: 27.59 seconds with UE use 08/08/23:  22.5s with UE use 5/20: 20.03s without UE use (lowered plinth) 10/17/23: 24.58 seconds without UE use in chair  GAIT: Distance walked: 100 feet Assistive device utilized: Single point cane Level of assistance: Modified independence Comments: labored, decreased foot clearance  TODAY'S TREATMENT:  DATE:   11/07/23 -NuStep L5, UE/LE x 5 min  -SKTC 10sec x5ea -LTR x10ea -supine marching with TrA 2x20 -DKTC with LES on physioball x51min -Seated clam with GTB x30 -Seated adductor sqz 5 2x10 -Seated assisted partial sit up using pink ball 2x10 -Seated fwd flexion stretch 10 x5  Gait in hall with SPC x130ft -PNF chops with pink ball (cues for core engagement) 2x10ea -STS from elevated plinth 2x10  11/02/23 -NuStep L6, UE/LE x 6 min  -SKTC 10sec x5ea -LTR x10ea -supine marching with TrA -DKTC with LES on physioball x24min -Seated clam with GTB x30 -Seated adductor sqz 5 2x10 -Seated assisted partial sit up using pink ball 2x10 -Seated  fwd flexion stretch 10 x5  Gait in hall with SPC x113ft -PNF chops with pink ball (cues for core engagement) x10ea -STS from elevated plinth x10   10/31/23 -NuStep L6, UE/LE x 6 min  -standing hip abduction 2x10ea (cues for posture and orientation of hips) -standing hip extension 2x10ea -side stepping at rail x3 laps with cues -hip hinge mechanics at wall x20 (extensive cuing) -Seated fwd flexion stretch 10 x5  Gait in hall with SPC x131ft -PNF chops with pink ball (cues for core engagement) x10ea -STS from elevated plinth 2x10 -step ups 6 x10ea (contralateral rail use)  10/27/23 -NuStep L6, UE/LE x 5 min  -standing hip abduction 2x10ea (cues for posture and orientation of hips) -standing hip extension 2x10ea -side stepping at rail x1 lap with cues -standing cone taps OH with cues for posture x5ea -hip hinge mechanics at wall x10 (extensive cuing) -Seated fwd flexion stretch 10 x5  Gait in hall with SPC x182ft -PNF chops with pink ball (cues for core engagement) x10ea -STS from elevated plinth 2x10  10/24/23 -NuStep L5, UE/LE x 5 min  -Seated fwd flexion stretch -Static cupping to lower thoracic and lumbar areas (Seated on stool leaning over pillow on plinth.) -PNF chops with pink ball (cues for core engagement) x10ea -STS from elevated plinth with GTB around thighs 2x10 -standing row with green band 2 x 20  10/20/23 -NuStep L5, UE/LE x 5 min  -Seated fwd flexion stretch -Static cupping to lower thoracic and lumbar areas -STM to upper, mid and lower back (Seated on stool leaning over pillow on plinth.) -Trunk rotation with GTB in standing (cues for posture) x10ea -PNF chops with pink ball (cues for core engagement) x10ea -STS from elevated plinth with GTB around thighs 2x5 -standing row with green band 2 x 10   10/17/23 Reassessment Discussion of symptoms and POC Step up 6 inch 2 x 10      PATIENT EDUCATION:  Education details: exercise rationale,  progression/modification  Person educated: Patient Education method: Explanation, Demonstration, Education comprehension: verbalized understanding, returned demonstration, verbal cues required, and tactile cues required  HOME EXERCISE PROGRAM: Access Code: [facility] URL: [email or web address]   ASSESSMENT:  CLINICAL IMPRESSION:  Improve tolerance for exercise today compared to previous sesson. Able to increase reps with PNF chops as well as with elevated STS today. Occasional cuing required throughout session for form and technique.  If hip pain continues to improve, will resume step ups next session.    OBJECTIVE IMPAIRMENTS: Abnormal gait, decreased activity tolerance, decreased balance, decreased endurance, decreased mobility, difficulty walking, decreased ROM, decreased strength, increased muscle spasms, impaired flexibility, impaired sensation, improper body mechanics, postural dysfunction, and pain.   ACTIVITY LIMITATIONS: carrying, lifting, bending, sitting, standing, squatting, stairs, transfers, reach over head, hygiene/grooming, locomotion level, and caring for others  PARTICIPATION  LIMITATIONS: meal prep, cleaning, laundry, shopping, community activity, and yard work  PERSONAL FACTORS: Fitness, Time since onset of injury/illness/exacerbation, and 3+ comorbidities: Chronic LBP, hx Lumbar surgery, CKD, hx breast cancer, DM, neuropathy, osteoporosis  are also affecting patient's functional outcome.   REHAB POTENTIAL: Good  CLINICAL DECISION MAKING: Evolving/moderate complexity  EVALUATION COMPLEXITY: Moderate   GOALS: Goals reviewed with patient? Yes  SHORT TERM GOALS: Target date: 08/11/2023    Patient will be independent with HEP in order to improve functional outcomes. Baseline:  Goal status: MET 5/20  2.  Patient will report at least 25% improvement in symptoms for improved quality of life. Baseline:  Goal status: MET 08/11/23   LONG TERM GOALS:  Target date: 09/08/2023    Patient will report at least 75% improvement in symptoms for improved quality of life. Baseline:  Goal status: IN PROGRESS  2.  Patient will improve modified oswestry score by at least 8 points in order to indicate improved tolerance to activity. Baseline:  Goal status: MET (improved by 6 points 5/20)  3.  Patient will demonstrate at least 25% improvement in lumbar ROM in all restricted planes for improved ability to move trunk while completing chores and cooking. Baseline:  Goal status: MET 5/20  4.  Patient will be able to complete 5x STS in under 15 seconds in order to demonstrate improved functional mobility/strength Baseline: see above Goal status: In progress - 5/20  5.  Patient will demonstrate grade of 5/5 MMT grade in all tested musculature as evidence of improved strength to assist with stair ambulation and gait.   Baseline:  Goal status: IN PROGRESS 5/20     PLAN:  PT FREQUENCY: 2x/week  PT DURATION: 8 weeks  PLANNED INTERVENTIONS: 97164- PT Re-evaluation, 97110-Therapeutic exercises, 97530- Therapeutic activity, 97112- Neuromuscular re-education, 97535- Self Care, [id number]- Manual therapy, [phone]- Gait training, [phone]- Orthotic Fit/training, [phone]- Canalith repositioning, [id number]- Aquatic Therapy, [phone]- Splinting, Patient/Family education, Balance training, Stair training, Taping, Dry Needling, Joint mobilization, Joint manipulation, Spinal manipulation, Spinal mobilization, Scar mobilization, and DME instructions.  PLAN FOR NEXT SESSION: f/u with HEP, spinal mobility, core and glute and LE strength, functional strength    Asberry FORBES Rodes, PTA 11/07/2023, 2:14 PM  [facility] [address] [place], [place], [address] Phone: [phone]   Fax:  [phone]    Referring diagnosis? M54.50 (ICD-10-CM) - Low back pain, unspecified back pain laterality, unspecified chronicity, unspecified  whether sciatica present Treatment diagnosis? (if different than referring diagnosis) m54.59 What was this (referring dx) caused by? []  Surgery []  Fall [x]  Ongoing issue []  Arthritis []  Other: ____________  Laterality: []  Rt []  Lt [x]  Both  Check all possible CPT codes:  *CHOOSE 10 OR LESS*    See Planned Interventions listed in the Plan section of the Evaluation.

## 2023-11-09 ENCOUNTER — Other Ambulatory Visit: Payer: Self-pay | Admitting: Pharmacist

## 2023-11-09 ENCOUNTER — Telehealth: Payer: Self-pay | Admitting: Pharmacist

## 2023-11-09 NOTE — Telephone Encounter (Signed)
 Attempt was made to contact patient by phone today for follow up by Clinical Pharmacist regarding diabetes.  Unable to reach patient or LM on VM.

## 2023-11-10 ENCOUNTER — Encounter (HOSPITAL_BASED_OUTPATIENT_CLINIC_OR_DEPARTMENT_OTHER): Payer: Self-pay | Admitting: Physical Therapy

## 2023-11-10 ENCOUNTER — Ambulatory Visit (HOSPITAL_BASED_OUTPATIENT_CLINIC_OR_DEPARTMENT_OTHER): Attending: Orthopedic Surgery | Admitting: Physical Therapy

## 2023-11-10 DIAGNOSIS — R2689 Other abnormalities of gait and mobility: Secondary | ICD-10-CM | POA: Insufficient documentation

## 2023-11-10 DIAGNOSIS — M5459 Other low back pain: Secondary | ICD-10-CM | POA: Diagnosis not present

## 2023-11-10 DIAGNOSIS — M6281 Muscle weakness (generalized): Secondary | ICD-10-CM | POA: Diagnosis not present

## 2023-11-10 NOTE — Therapy (Signed)
 OUTPATIENT PHYSICAL THERAPY THORACOLUMBAR NOTE      Patient Name: [name] MRN: [id number] DOB:[birth year], 73 y.o., female Today's Date: 11/10/2023  Progress Note   Reporting Period 10/17/23 to 11/10/23   See note below for Objective Data and Assessment of Progress/Goals    END OF SESSION:  PT End of Session - 11/10/23 1149     Visit Number 22    Number of Visits 40    Date for PT Re-Evaluation 11/28/23    Authorization Type [employer]    Authorization Time Period 20 visits approved  From 06.02.2025 - 08.02.2025    Authorization - Visit Number 9    Authorization - Number of Visits 20    Progress Note Due on Visit 32    PT Start Time 1147    PT Stop Time 1227    PT Time Calculation (min) 40 min    Activity Tolerance Patient tolerated treatment well    Behavior During Therapy [facility] for tasks assessed/performed                     Past Medical History:  Diagnosis Date   Anginal pain (HCC) 03/19/2012   saw Dr. Calhoun .. she thinks its more related to stomach issues   Arthritis    all over; mostly in my back (03/20/2012)   Breast cancer (HCC)    right (03/20/2012)   Cardiomyopathy    Chest pain 06/2005   Hospitalized, dystolic dysfunction   Chronic lower back pain    have 2 herniated discs; going to have to have a fusion (03/20/2012)   CKD (chronic kidney disease), stage III (HCC)    Family history of anesthesia complication    father has nausea and vomiting   Gallstones    GERD (gastroesophageal reflux disease)    H/O hiatal hernia    History of kidney stones    History of right mastectomy    Hypertension    patient states never had HTN, takes Hyzaar for heart   Hypothyroidism    Irritable bowel syndrome    Migraines    it's been a long time since I've had one (03/20/2012)   Osteoarthritis    Peripheral neuropathy    PONV (postoperative nausea and vomiting)    Sciatica    Sinus bradycardia    Sleep apnea    mild, no CPAP    Type II diabetes mellitus (HCC)    Past Surgical History:  Procedure Laterality Date   ABDOMINAL HYSTERECTOMY  1993   adenosin cardiolite   07/2005   (+) wall motion abnormality   AXILLARY LYMPH NODE DISSECTION  2003   squamous cell cancer   BACK SURGERY     CARDIAC CATHETERIZATION  ? 2005 / 2007   CHOLECYSTECTOMY N/A 01/24/2018   Procedure: LAPAROSCOPIC CHOLECYSTECTOMY;  Surgeon: Vanderbilt Ned, MD;  Location: [facility] OR;  Service: General;  Laterality: N/A;   CT abdomen and pelvis  02/2010   Same   ESOPHAGOGASTRODUODENOSCOPY  2005   GERD   KNEE ARTHROSCOPY  1990's   right (03/20/2012)   LUMBAR FUSION N/A 08/22/2014   Procedure: Right L2-3 and right L1-2 transforaminal lumbar interbody fusion with pedicle screws, rods, sleeves, cages, local bone graft, Vivigen, cancellous chips;  Surgeon: Lynwood FORBES Better, MD;  Location: [facility] OR;  Service: Orthopedics;  Laterality: N/A;   LUMBAR LAMINECTOMY/DECOMPRESSION MICRODISCECTOMY N/A 02/04/2014   Procedure: RIGHT L2-3 MICRODISCECTOMY ;  Surgeon: Lynwood FORBES Better, MD;  Location: [facility] OR;  Service: Orthopedics;  Laterality: N/A;   MASTECTOMY WITH AXILLARY LYMPH NODE DISSECTION  12/2003   right (03/20/2012)   PARATHYROIDECTOMY Left 10/11/2022   Procedure: MINIMALLY INVASIVE LEFT INFERIOR PARATHYROIDECTOMY;  Surgeon: Rubin Calamity, MD;  Location: [facility] OR;  Service: General;  Laterality: Left;   POSTERIOR CERVICAL FUSION/FORAMINOTOMY  2001   SHOULDER ARTHROSCOPY W/ ROTATOR CUFF REPAIR  2003   Left   THYROIDECTOMY  1977   Right   TOTAL KNEE ARTHROPLASTY  2000   Right   US  of abdomen  07/2005   Fatty liver / kidney stones   Patient Active Problem List   Diagnosis Date Noted   Urinary incontinence 08/09/2023   Metallic taste 05/11/2023   S/P parathyroidectomy 10/11/2022   Allergic rhinitis 07/25/2022   Syncope 07/21/2022   Sinus bradycardia 07/21/2022   Hammer toe of second toe of left foot 05/05/2021   Corns and callosities 05/05/2021    Hyperlipidemia associated with type 2 diabetes mellitus (HCC) 02/01/2021   Depression 02/01/2021   Localized osteoporosis without current pathological fracture 05/01/2020   Hyperparathyroidism (HCC) 12/13/2019   Elevated hemoglobin (HCC) 12/13/2019   Chronic renal impairment, stage 4 (severe) (HCC) 10/28/2019   Anemia 10/28/2019   Physical exam 06/26/2019   Neck pain 04/18/2019   Ascending aortic aneurysm (HCC) 04/17/2019   Nonspecific chest pain 04/15/2019   Neuropathic pain of right flank 12/27/2018   Dysphagia 12/27/2018   Postsurgical hypothyroidism 06/11/2018   Vitamin D  deficiency 06/11/2018   Multinodular goiter 06/11/2018   DM type 2 causing vascular disease (HCC)    Peripheral neuropathy    Migraine    Irritable bowel syndrome    Essential hypertension    History of right mastectomy    H/O hiatal hernia    GERD (gastroesophageal reflux disease)    Family history of anesthesia complication    Other chronic pain    Type 2 diabetes mellitus with diabetic chronic kidney disease (HCC)    Lower extremity edema    Chronic constipation 06/23/2015   Chronic diastolic heart failure (HCC) 11/17/2014   Herniated nucleus pulposus, L2-3 right 02/04/2014    Class: Acute   Spondylolisthesis of lumbar region 04/30/2012    Class: Chronic   Lumbar stenosis with neurogenic claudication 04/30/2012    Class: Chronic   SYNCOPE-CAROTID SINUS 09/16/2008   OSTEOARTHRITIS 08/23/2006   DDD (degenerative disc disease), cervical 08/23/2006   DISC DISEASE, LUMBAR 08/23/2006   Headache(784.0) 08/23/2006   BREAST CANCER, HX OF 08/23/2006   RENAL CALCULUS, HX OF 08/23/2006    PCP: Mahlon Comer BRAVO, MD   REFERRING PROVIDER: Georgina Ozell LABOR, MD  REFERRING DIAG: M54.50 (ICD-10-CM) - Low back pain, unspecified back pain laterality, unspecified chronicity, unspecified whether sciatica present  Rationale for Evaluation and Treatment: Rehabilitation  THERAPY DIAG:  Muscle weakness  (generalized)  Other abnormalities of gait and mobility  Other low back pain  ONSET DATE: chronic   SUBJECTIVE:  SUBJECTIVE STATEMENT: Pt reports R hip is bothering her today and yesterday but she isnt sure why. Patient states she has been doing HEP. She feels like she is at 50% functional status. Spot in back still hurts but not quite as bad when sitting, still bother her with standing.   PERTINENT HISTORY:  Chronic LBP, hx Lumbar surgery, CKD, hx breast cancer, DM, neuropathy, osteoporosis, does have spinal cord stimulator  PAIN:  Are you having pain? Yes: NPRS scale: hip 8/10; 5/10 Pain location: low back, abdomen  Pain description: achy Aggravating factors: walking/standing Relieving factors: laying down  PRECAUTIONS: None  WEIGHT BEARING RESTRICTIONS: No  FALLS:  Has patient fallen in last 6 months? No  PLOF: Independent  PATIENT GOALS: stand up and cook   OBJECTIVE: (objective measures from initial evaluation unless otherwise dated)  PATIENT SURVEYS: Modified Oswestry 30/50  5/20: 24/50 10/17/23: 18 / 50 7/24: 11/10/23 30/50  SCREENING FOR RED FLAGS: Bowel or bladder incontinence: No Spinal tumors: No Cauda equina syndrome: No Compression fracture: No Abdominal aneurysm: No  COGNITION: Overall cognitive status: Within functional limits for tasks assessed     SENSATION: WFL  POSTURE: rounded shoulders, forward head, decreased lumbar lordosis, increased thoracic kyphosis, and flexed trunk   PALPATION: TTP lumbar and thoracic paraspinals with greatest tenderness throughout lumbar spine.  LUMBAR ROM:   AROM eval 5/20 11/10/23  Flexion 25% limited Full 0% limited  Extension 90% limited * 40% limited 40% limited  Right lateral flexion 75% limited * 25% limited* 25% limited*   Left lateral flexion 75% limited* 40% limited 25% limited  Right rotation 50% limited* 15% limited 0% limited  Left rotation 50% limited* Full 0% limited   (Blank rows = not tested) * = pain/symptoms  LOWER EXTREMITY ROM:     Active  Right eval Left eval  Hip flexion    Hip extension    Hip abduction    Hip adduction    Hip internal rotation    Hip external rotation    Knee flexion    Knee extension    Ankle dorsiflexion    Ankle plantarflexion    Ankle inversion    Ankle eversion     (Blank rows = not tested) * = pain/symptoms  LOWER EXTREMITY MMT:  unable to test extension as patient unable to get into prone  MMT Right eval Left eval Right 5/20 Left 5/20 Right 10/17/23 Left 10/17/23 Right 11/10/23 Left 11/10/23  Hip flexion 4+ 4+ 4+ 4 4+ 4+ 5 4+  Hip extension          Hip abduction 4- 3+ 4+ (seated) 4-(seated) 4+ (seated) 4 + (seated) 4+ 4+  Hip adduction          Hip internal rotation          Hip external rotation          Knee flexion 4+ 4+ 4+ 4 4+ 4+ 5 5  Knee extension 4+ 4+ 4+ 4 4+ 4+ 5 5  Ankle dorsiflexion 4+ 4+ 5 4+      Ankle plantarflexion          Ankle inversion          Ankle eversion           (Blank rows = not tested) * = pain/symptoms    FUNCTIONAL TESTS:  5 times sit to stand: 27.59 seconds with UE use 08/08/23:  22.5s with UE use 5/20: 20.03s without UE use (lowered plinth) 10/17/23: 24.58 seconds  without UE use in chair 11/10/23: 23.89 seconds with intermittent UE use in chair - increased difficulty due to R hip pain  GAIT: Distance walked: 100 feet Assistive device utilized: Single point cane Level of assistance: Modified independence Comments: labored, decreased foot clearance  TODAY'S TREATMENT:                                                                                                                              DATE:  11/10/23 Reassessment and discussion of POC Standing hip abduction 2 x 10  Standing hip extension 2 x 10    11/07/23 -NuStep L5, UE/LE x 5 min  -SKTC 10sec x5ea -LTR x10ea -supine marching with TrA 2x20 -DKTC with LES on physioball x45min -Seated clam with GTB x30 -Seated adductor sqz 5 2x10 -Seated assisted partial sit up using pink ball 2x10 -Seated fwd flexion stretch 10 x5  Gait in hall with SPC x191ft -PNF chops with pink ball (cues for core engagement) 2x10ea -STS from elevated plinth 2x10  11/02/23 -NuStep L6, UE/LE x 6 min  -SKTC 10sec x5ea -LTR x10ea -supine marching with TrA -DKTC with LES on physioball x2min -Seated clam with GTB x30 -Seated adductor sqz 5 2x10 -Seated assisted partial sit up using pink ball 2x10 -Seated fwd flexion stretch 10 x5  Gait in hall with SPC x179ft -PNF chops with pink ball (cues for core engagement) x10ea -STS from elevated plinth x10   10/31/23 -NuStep L6, UE/LE x 6 min  -standing hip abduction 2x10ea (cues for posture and orientation of hips) -standing hip extension 2x10ea -side stepping at rail x3 laps with cues -hip hinge mechanics at wall x20 (extensive cuing) -Seated fwd flexion stretch 10 x5  Gait in hall with SPC x163ft -PNF chops with pink ball (cues for core engagement) x10ea -STS from elevated plinth 2x10 -step ups 6 x10ea (contralateral rail use)  10/27/23 -NuStep L6, UE/LE x 5 min  -standing hip abduction 2x10ea (cues for posture and orientation of hips) -standing hip extension 2x10ea -side stepping at rail x1 lap with cues -standing cone taps OH with cues for posture x5ea -hip hinge mechanics at wall x10 (extensive cuing) -Seated fwd flexion stretch 10 x5  Gait in hall with SPC x175ft -PNF chops with pink ball (cues for core engagement) x10ea -STS from elevated plinth 2x10  10/24/23 -NuStep L5, UE/LE x 5 min  -Seated fwd flexion stretch -Static cupping to lower thoracic and lumbar areas (Seated on stool leaning over pillow on plinth.) -PNF chops with pink ball (cues for core engagement) x10ea -STS from  elevated plinth with GTB around thighs 2x10 -standing row with green band 2 x 20  10/20/23 -NuStep L5, UE/LE x 5 min  -Seated fwd flexion stretch -Static cupping to lower thoracic and lumbar areas -STM to upper, mid and lower back (Seated on stool leaning over pillow on plinth.) -Trunk rotation with GTB in standing (cues for posture) x10ea -PNF chops with pink ball (cues  for core engagement) x10ea -STS from elevated plinth with GTB around thighs 2x5 -standing row with green band 2 x 10   10/17/23 Reassessment Discussion of symptoms and POC Step up 6 inch 2 x 10      PATIENT EDUCATION:  Education details: exercise rationale, progression/modification 11/10/23 reassessment findings, POC Person educated: Patient Education method: Explanation, Demonstration, Education comprehension: verbalized understanding, returned demonstration, verbal cues required, and tactile cues required  HOME EXERCISE PROGRAM: Access Code: [facility] URL: [email or web address]   ASSESSMENT:  CLINICAL IMPRESSION:  Patient has met 2/2 short term goals and 2/5 long term goals with ability to complete HEP and improvement in symptoms, ROM, activity tolerance. Remaining goals not met due to continued deficits in strength, activity tolerance, posture, and functional mobility. Patient has made good progress toward remaining goals with improving strength. 5xSTS likely limited by increased R hip pain over last few days. Will continue to progress in existing POC pending insurance auth. Patient will continue to benefit from skilled physical therapy in order to improve function and reduce impairment.    OBJECTIVE IMPAIRMENTS: Abnormal gait, decreased activity tolerance, decreased balance, decreased endurance, decreased mobility, difficulty walking, decreased ROM, decreased strength, increased muscle spasms, impaired flexibility, impaired sensation, improper body mechanics, postural dysfunction, and pain.    ACTIVITY LIMITATIONS: carrying, lifting, bending, sitting, standing, squatting, stairs, transfers, reach over head, hygiene/grooming, locomotion level, and caring for others  PARTICIPATION LIMITATIONS: meal prep, cleaning, laundry, shopping, community activity, and yard work  PERSONAL FACTORS: Fitness, Time since onset of injury/illness/exacerbation, and 3+ comorbidities: Chronic LBP, hx Lumbar surgery, CKD, hx breast cancer, DM, neuropathy, osteoporosis  are also affecting patient's functional outcome.   REHAB POTENTIAL: Good  CLINICAL DECISION MAKING: Evolving/moderate complexity  EVALUATION COMPLEXITY: Moderate   GOALS: Goals reviewed with patient? Yes  SHORT TERM GOALS: Target date: 08/11/2023    Patient will be independent with HEP in order to improve functional outcomes. Baseline:  Goal status: MET 5/20  2.  Patient will report at least 25% improvement in symptoms for improved quality of life. Baseline:  Goal status: MET 08/11/23   LONG TERM GOALS: Target date: 09/08/2023    Patient will report at least 75% improvement in symptoms for improved quality of life. Baseline:  Goal status: IN PROGRESS  2.  Patient will improve modified oswestry score by at least 8 points in order to indicate improved tolerance to activity. Baseline:  Goal status: MET (improved by 6 points 5/20)  3.  Patient will demonstrate at least 25% improvement in lumbar ROM in all restricted planes for improved ability to move trunk while completing chores and cooking. Baseline:  Goal status: MET 5/20  4.  Patient will be able to complete 5x STS in under 15 seconds in order to demonstrate improved functional mobility/strength Baseline: see above Goal status: In progress - 5/20  5.  Patient will demonstrate grade of 5/5 MMT grade in all tested musculature as evidence of improved strength to assist with stair ambulation and gait.   Baseline:  Goal status: IN PROGRESS 5/20     PLAN:  PT  FREQUENCY: 2x/week  PT DURATION: 8 weeks  PLANNED INTERVENTIONS: [id number]- PT Re-evaluation, 97110-Therapeutic exercises, 97530- Therapeutic activity, 97112- Neuromuscular re-education, 97535- Self Care, [id number]- Manual therapy, [phone]- Gait training, [phone]- Orthotic Fit/training, [phone]- Canalith repositioning, [id number]- Aquatic Therapy, [phone]- Splinting, Patient/Family education, Balance training, Stair training, Taping, Dry Needling, Joint mobilization, Joint manipulation, Spinal manipulation, Spinal mobilization, Scar mobilization, and DME instructions.  PLAN FOR NEXT SESSION: f/u with HEP,  spinal mobility, core and glute and LE strength, functional strength    Prentice GORMAN Stains, PT, DPT 11/10/2023, 12:29 PM  [facility] [address] [place], [place], [address] Phone: [phone]   Fax:  [phone]    Referring diagnosis? M54.50 (ICD-10-CM) - Low back pain, unspecified back pain laterality, unspecified chronicity, unspecified whether sciatica present Treatment diagnosis? (if different than referring diagnosis) m54.59 What was this (referring dx) caused by? []  Surgery []  Fall [x]  Ongoing issue []  Arthritis []  Other: ____________  Laterality: []  Rt []  Lt [x]  Both  Check all possible CPT codes:  *CHOOSE 10 OR LESS*    See Planned Interventions listed in the Plan section of the Evaluation.

## 2023-11-13 ENCOUNTER — Encounter (HOSPITAL_BASED_OUTPATIENT_CLINIC_OR_DEPARTMENT_OTHER): Payer: Self-pay

## 2023-11-13 ENCOUNTER — Ambulatory Visit (HOSPITAL_BASED_OUTPATIENT_CLINIC_OR_DEPARTMENT_OTHER)

## 2023-11-13 DIAGNOSIS — M5459 Other low back pain: Secondary | ICD-10-CM

## 2023-11-13 DIAGNOSIS — M6281 Muscle weakness (generalized): Secondary | ICD-10-CM | POA: Diagnosis not present

## 2023-11-13 DIAGNOSIS — R2689 Other abnormalities of gait and mobility: Secondary | ICD-10-CM

## 2023-11-13 NOTE — Therapy (Signed)
 OUTPATIENT PHYSICAL THERAPY THORACOLUMBAR NOTE      Patient Name: [name] MRN: [id number] DOB:May 15, 1950, 73 y.o., female Today's Date: 11/13/2023  Progress Note   Reporting Period 10/17/23 to 11/10/23   See note below for Objective Data and Assessment of Progress/Goals    END OF SESSION:  PT End of Session - 11/13/23 1150     Visit Number 23    Number of Visits 40    Date for PT Re-Evaluation 11/28/23    Authorization Type [employer]    Authorization Time Period 18 visits approved from 11/13/2023-01/13/2024    Authorization - Visit Number 1    Authorization - Number of Visits 18    Progress Note Due on Visit 32    PT Start Time 1147    PT Stop Time 1230    PT Time Calculation (min) 43 min    Activity Tolerance Patient tolerated treatment well    Behavior During Therapy [facility] for tasks assessed/performed                      Past Medical History:  Diagnosis Date   Anginal pain (HCC) 03/19/2012   saw Dr. Calhoun .. she thinks its more related to stomach issues   Arthritis    all over; mostly in my back (03/20/2012)   Breast cancer (HCC)    right (03/20/2012)   Cardiomyopathy    Chest pain 06/2005   Hospitalized, dystolic dysfunction   Chronic lower back pain    have 2 herniated discs; going to have to have a fusion (03/20/2012)   CKD (chronic kidney disease), stage III (HCC)    Family history of anesthesia complication    father has nausea and vomiting   Gallstones    GERD (gastroesophageal reflux disease)    H/O hiatal hernia    History of kidney stones    History of right mastectomy    Hypertension    patient states never had HTN, takes Hyzaar for heart   Hypothyroidism    Irritable bowel syndrome    Migraines    it's been a long time since I've had one (03/20/2012)   Osteoarthritis    Peripheral neuropathy    PONV (postoperative nausea and vomiting)    Sciatica    Sinus bradycardia    Sleep apnea    mild, no CPAP    Type II diabetes mellitus (HCC)    Past Surgical History:  Procedure Laterality Date   ABDOMINAL HYSTERECTOMY  1993   adenosin cardiolite   07/2005   (+) wall motion abnormality   AXILLARY LYMPH NODE DISSECTION  2003   squamous cell cancer   BACK SURGERY     CARDIAC CATHETERIZATION  ? 2005 / 2007   CHOLECYSTECTOMY N/A 01/24/2018   Procedure: LAPAROSCOPIC CHOLECYSTECTOMY;  Surgeon: Vanderbilt Ned, MD;  Location: [facility] OR;  Service: General;  Laterality: N/A;   CT abdomen and pelvis  02/2010   Same   ESOPHAGOGASTRODUODENOSCOPY  2005   GERD   KNEE ARTHROSCOPY  1990's   right (03/20/2012)   LUMBAR FUSION N/A 08/22/2014   Procedure: Right L2-3 and right L1-2 transforaminal lumbar interbody fusion with pedicle screws, rods, sleeves, cages, local bone graft, Vivigen, cancellous chips;  Surgeon: Lynwood FORBES Better, MD;  Location: [facility] OR;  Service: Orthopedics;  Laterality: N/A;   LUMBAR LAMINECTOMY/DECOMPRESSION MICRODISCECTOMY N/A 02/04/2014   Procedure: RIGHT L2-3 MICRODISCECTOMY ;  Surgeon: Lynwood FORBES Better, MD;  Location: [facility] OR;  Service: Orthopedics;  Laterality: N/A;  MASTECTOMY WITH AXILLARY LYMPH NODE DISSECTION  12/2003   right (03/20/2012)   PARATHYROIDECTOMY Left 10/11/2022   Procedure: MINIMALLY INVASIVE LEFT INFERIOR PARATHYROIDECTOMY;  Surgeon: Rubin Calamity, MD;  Location: [facility] OR;  Service: General;  Laterality: Left;   POSTERIOR CERVICAL FUSION/FORAMINOTOMY  2001   SHOULDER ARTHROSCOPY W/ ROTATOR CUFF REPAIR  2003   Left   THYROIDECTOMY  1977   Right   TOTAL KNEE ARTHROPLASTY  2000   Right   US  of abdomen  07/2005   Fatty liver / kidney stones   Patient Active Problem List   Diagnosis Date Noted   Urinary incontinence 08/09/2023   Metallic taste 05/11/2023   S/P parathyroidectomy 10/11/2022   Allergic rhinitis 07/25/2022   Syncope 07/21/2022   Sinus bradycardia 07/21/2022   Hammer toe of second toe of left foot 05/05/2021   Corns and callosities 05/05/2021    Hyperlipidemia associated with type 2 diabetes mellitus (HCC) 02/01/2021   Depression 02/01/2021   Localized osteoporosis without current pathological fracture 05/01/2020   Hyperparathyroidism (HCC) 12/13/2019   Elevated hemoglobin (HCC) 12/13/2019   Chronic renal impairment, stage 4 (severe) (HCC) 10/28/2019   Anemia 10/28/2019   Physical exam 06/26/2019   Neck pain 04/18/2019   Ascending aortic aneurysm (HCC) 04/17/2019   Nonspecific chest pain 04/15/2019   Neuropathic pain of right flank 12/27/2018   Dysphagia 12/27/2018   Postsurgical hypothyroidism 06/11/2018   Vitamin D  deficiency 06/11/2018   Multinodular goiter 06/11/2018   DM type 2 causing vascular disease (HCC)    Peripheral neuropathy    Migraine    Irritable bowel syndrome    Essential hypertension    History of right mastectomy    H/O hiatal hernia    GERD (gastroesophageal reflux disease)    Family history of anesthesia complication    Other chronic pain    Type 2 diabetes mellitus with diabetic chronic kidney disease (HCC)    Lower extremity edema    Chronic constipation 06/23/2015   Chronic diastolic heart failure (HCC) 11/17/2014   Herniated nucleus pulposus, L2-3 right 02/04/2014    Class: Acute   Spondylolisthesis of lumbar region 04/30/2012    Class: Chronic   Lumbar stenosis with neurogenic claudication 04/30/2012    Class: Chronic   SYNCOPE-CAROTID SINUS 09/16/2008   OSTEOARTHRITIS 08/23/2006   DDD (degenerative disc disease), cervical 08/23/2006   DISC DISEASE, LUMBAR 08/23/2006   Headache(784.0) 08/23/2006   BREAST CANCER, HX OF 08/23/2006   RENAL CALCULUS, HX OF 08/23/2006    PCP: Mahlon Comer BRAVO, MD   REFERRING PROVIDER: Georgina Ozell LABOR, MD  REFERRING DIAG: M54.50 (ICD-10-CM) - Low back pain, unspecified back pain laterality, unspecified chronicity, unspecified whether sciatica present  Rationale for Evaluation and Treatment: Rehabilitation  THERAPY DIAG:  Muscle weakness  (generalized)  Other abnormalities of gait and mobility  Other low back pain  ONSET DATE: chronic   SUBJECTIVE:  SUBJECTIVE STATEMENT: Pt reports no significant pain at entry.    PERTINENT HISTORY:  Chronic LBP, hx Lumbar surgery, CKD, hx breast cancer, DM, neuropathy, osteoporosis, does have spinal cord stimulator  PAIN:  Are you having pain? Yes: NPRS scale: hip 2/10; 6/10 Pain location: low back, abdomen  Pain description: achy Aggravating factors: walking/standing Relieving factors: laying down  PRECAUTIONS: None  WEIGHT BEARING RESTRICTIONS: No  FALLS:  Has patient fallen in last 6 months? No  PLOF: Independent  PATIENT GOALS: stand up and cook   OBJECTIVE: (objective measures from initial evaluation unless otherwise dated)  PATIENT SURVEYS: Modified Oswestry 30/50  5/20: 24/50 10/17/23: 18 / 50 7/24: 11/10/23 30/50  SCREENING FOR RED FLAGS: Bowel or bladder incontinence: No Spinal tumors: No Cauda equina syndrome: No Compression fracture: No Abdominal aneurysm: No  COGNITION: Overall cognitive status: Within functional limits for tasks assessed     SENSATION: WFL  POSTURE: rounded shoulders, forward head, decreased lumbar lordosis, increased thoracic kyphosis, and flexed trunk   PALPATION: TTP lumbar and thoracic paraspinals with greatest tenderness throughout lumbar spine.  LUMBAR ROM:   AROM eval 5/20 11/10/23  Flexion 25% limited Full 0% limited  Extension 90% limited * 40% limited 40% limited  Right lateral flexion 75% limited * 25% limited* 25% limited*  Left lateral flexion 75% limited* 40% limited 25% limited  Right rotation 50% limited* 15% limited 0% limited  Left rotation 50% limited* Full 0% limited   (Blank rows = not tested) * = pain/symptoms  LOWER  EXTREMITY ROM:     Active  Right eval Left eval  Hip flexion    Hip extension    Hip abduction    Hip adduction    Hip internal rotation    Hip external rotation    Knee flexion    Knee extension    Ankle dorsiflexion    Ankle plantarflexion    Ankle inversion    Ankle eversion     (Blank rows = not tested) * = pain/symptoms  LOWER EXTREMITY MMT:  unable to test extension as patient unable to get into prone  MMT Right eval Left eval Right 5/20 Left 5/20 Right 10/17/23 Left 10/17/23 Right 11/10/23 Left 11/10/23  Hip flexion 4+ 4+ 4+ 4 4+ 4+ 5 4+  Hip extension          Hip abduction 4- 3+ 4+ (seated) 4-(seated) 4+ (seated) 4 + (seated) 4+ 4+  Hip adduction          Hip internal rotation          Hip external rotation          Knee flexion 4+ 4+ 4+ 4 4+ 4+ 5 5  Knee extension 4+ 4+ 4+ 4 4+ 4+ 5 5  Ankle dorsiflexion 4+ 4+ 5 4+      Ankle plantarflexion          Ankle inversion          Ankle eversion           (Blank rows = not tested) * = pain/symptoms    FUNCTIONAL TESTS:  5 times sit to stand: 27.59 seconds with UE use 08/08/23:  22.5s with UE use 5/20: 20.03s without UE use (lowered plinth) 10/17/23: 24.58 seconds without UE use in chair 11/10/23: 23.89 seconds with intermittent UE use in chair - increased difficulty due to R hip pain  GAIT: Distance walked: 100 feet Assistive device utilized: Single point cane Level of assistance: Modified independence Comments:  labored, decreased foot clearance  TODAY'S TREATMENT:                                                                                                                              DATE:    11/13/23 -NuStep L5, UE/LE x 5 min  -SKTC 30sec x3ea -LTR x10ea -supine marching with TrA 2x20 -Hooklying oblique reaches 2x10ea -DKTC with LES on physioball x54min -Seated clam with GTB x30 -Seated adductor sqz 5 2x10 -Seated assisted partial sit up using pink ball 2x10 -Seated fwd flexion stretch 10 x5  -PNF  chops with pink ball (cues for core engagement) 2x10ea -STS from elevated plinth 2x10  11/10/23 Reassessment and discussion of POC Standing hip abduction 2 x 10  Standing hip extension 2 x 10   11/07/23 -NuStep L5, UE/LE x 5 min  -SKTC 10sec x5ea -LTR x10ea -supine marching with TrA 2x20 -DKTC with LES on physioball x29min -Seated clam with GTB x30 -Seated adductor sqz 5 2x10 -Seated assisted partial sit up using pink ball 2x10 -Seated fwd flexion stretch 10 x5  Gait in hall with SPC x146ft -PNF chops with pink ball (cues for core engagement) 2x10ea -STS from elevated plinth 2x10  11/02/23 -NuStep L6, UE/LE x 6 min  -SKTC 10sec x5ea -LTR x10ea -supine marching with TrA -DKTC with LES on physioball x46min -Seated clam with GTB x30 -Seated adductor sqz 5 2x10 -Seated assisted partial sit up using pink ball 2x10 -Seated fwd flexion stretch 10 x5  Gait in hall with SPC x158ft -PNF chops with pink ball (cues for core engagement) x10ea -STS from elevated plinth x10   10/31/23 -NuStep L6, UE/LE x 6 min  -standing hip abduction 2x10ea (cues for posture and orientation of hips) -standing hip extension 2x10ea -side stepping at rail x3 laps with cues -hip hinge mechanics at wall x20 (extensive cuing) -Seated fwd flexion stretch 10 x5  Gait in hall with SPC x13ft -PNF chops with pink ball (cues for core engagement) x10ea -STS from elevated plinth 2x10 -step ups 6 x10ea (contralateral rail use)  10/27/23 -NuStep L6, UE/LE x 5 min  -standing hip abduction 2x10ea (cues for posture and orientation of hips) -standing hip extension 2x10ea -side stepping at rail x1 lap with cues -standing cone taps OH with cues for posture x5ea -hip hinge mechanics at wall x10 (extensive cuing) -Seated fwd flexion stretch 10 x5  Gait in hall with SPC x185ft -PNF chops with pink ball (cues for core engagement) x10ea -STS from elevated plinth 2x10  10/24/23 -NuStep L5, UE/LE x 5 min  -Seated  fwd flexion stretch -Static cupping to lower thoracic and lumbar areas (Seated on stool leaning over pillow on plinth.) -PNF chops with pink ball (cues for core engagement) x10ea -STS from elevated plinth with GTB around thighs 2x10 -standing row with green band 2 x 20  10/20/23 -NuStep L5, UE/LE x 5 min  -Seated fwd flexion stretch -Static cupping to lower thoracic and lumbar  areas -STM to upper, mid and lower back (Seated on stool leaning over pillow on plinth.) -Trunk rotation with GTB in standing (cues for posture) x10ea -PNF chops with pink ball (cues for core engagement) x10ea -STS from elevated plinth with GTB around thighs 2x5 -standing row with green band 2 x 10   10/17/23 Reassessment Discussion of symptoms and POC Step up 6 inch 2 x 10      PATIENT EDUCATION:  Education details: exercise rationale, progression/modification 11/10/23 reassessment findings, POC Person educated: Patient Education method: Explanation, Demonstration, Education comprehension: verbalized understanding, returned demonstration, verbal cues required, and tactile cues required  HOME EXERCISE PROGRAM: Access Code: [facility] URL: [email or web address]   ASSESSMENT:  CLINICAL IMPRESSION:  Good tolerance for core stability and strengthening progressions.  Pt reported lumbar discomfort while lying on plinth, which improved once seated. Mild fatigue with sit to stands. Pt progressing well.    OBJECTIVE IMPAIRMENTS: Abnormal gait, decreased activity tolerance, decreased balance, decreased endurance, decreased mobility, difficulty walking, decreased ROM, decreased strength, increased muscle spasms, impaired flexibility, impaired sensation, improper body mechanics, postural dysfunction, and pain.   ACTIVITY LIMITATIONS: carrying, lifting, bending, sitting, standing, squatting, stairs, transfers, reach over head, hygiene/grooming, locomotion level, and caring for others  PARTICIPATION  LIMITATIONS: meal prep, cleaning, laundry, shopping, community activity, and yard work  PERSONAL FACTORS: Fitness, Time since onset of injury/illness/exacerbation, and 3+ comorbidities: Chronic LBP, hx Lumbar surgery, CKD, hx breast cancer, DM, neuropathy, osteoporosis  are also affecting patient's functional outcome.   REHAB POTENTIAL: Good  CLINICAL DECISION MAKING: Evolving/moderate complexity  EVALUATION COMPLEXITY: Moderate   GOALS: Goals reviewed with patient? Yes  SHORT TERM GOALS: Target date: 08/11/2023    Patient will be independent with HEP in order to improve functional outcomes. Baseline:  Goal status: MET 5/20  2.  Patient will report at least 25% improvement in symptoms for improved quality of life. Baseline:  Goal status: MET 08/11/23   LONG TERM GOALS: Target date: 09/08/2023    Patient will report at least 75% improvement in symptoms for improved quality of life. Baseline:  Goal status: IN PROGRESS  2.  Patient will improve modified oswestry score by at least 8 points in order to indicate improved tolerance to activity. Baseline:  Goal status: MET (improved by 6 points 5/20)  3.  Patient will demonstrate at least 25% improvement in lumbar ROM in all restricted planes for improved ability to move trunk while completing chores and cooking. Baseline:  Goal status: MET 5/20  4.  Patient will be able to complete 5x STS in under 15 seconds in order to demonstrate improved functional mobility/strength Baseline: see above Goal status: In progress - 5/20  5.  Patient will demonstrate grade of 5/5 MMT grade in all tested musculature as evidence of improved strength to assist with stair ambulation and gait.   Baseline:  Goal status: IN PROGRESS 5/20     PLAN:  PT FREQUENCY: 2x/week  PT DURATION: 8 weeks  PLANNED INTERVENTIONS: 97164- PT Re-evaluation, 97110-Therapeutic exercises, 97530- Therapeutic activity, 97112- Neuromuscular re-education, 97535- Self  Care, 02859- Manual therapy, [phone]- Gait training, [phone]- Orthotic Fit/training, [phone]- Canalith repositioning, [id number]- Aquatic Therapy, [phone]- Splinting, Patient/Family education, Balance training, Stair training, Taping, Dry Needling, Joint mobilization, Joint manipulation, Spinal manipulation, Spinal mobilization, Scar mobilization, and DME instructions.  PLAN FOR NEXT SESSION: f/u with HEP, spinal mobility, core and glute and LE strength, functional strength    Asberry FORBES Rodes, PTA, DPT 11/13/2023, 2:07 PM  [facility]  [address] [place], [place], [address] Phone: [phone]   Fax:  [phone]    Referring diagnosis? M54.50 (ICD-10-CM) - Low back pain, unspecified back pain laterality, unspecified chronicity, unspecified whether sciatica present Treatment diagnosis? (if different than referring diagnosis) m54.59 What was this (referring dx) caused by? []  Surgery []  Fall [x]  Ongoing issue []  Arthritis []  Other: ____________  Laterality: []  Rt []  Lt [x]  Both  Check all possible CPT codes:  *CHOOSE 10 OR LESS*    See Planned Interventions listed in the Plan section of the Evaluation.

## 2023-11-15 ENCOUNTER — Encounter (HOSPITAL_BASED_OUTPATIENT_CLINIC_OR_DEPARTMENT_OTHER): Payer: Self-pay

## 2023-11-15 ENCOUNTER — Ambulatory Visit (HOSPITAL_BASED_OUTPATIENT_CLINIC_OR_DEPARTMENT_OTHER)

## 2023-11-15 DIAGNOSIS — M6281 Muscle weakness (generalized): Secondary | ICD-10-CM

## 2023-11-15 DIAGNOSIS — R2689 Other abnormalities of gait and mobility: Secondary | ICD-10-CM | POA: Diagnosis not present

## 2023-11-15 DIAGNOSIS — M5459 Other low back pain: Secondary | ICD-10-CM | POA: Diagnosis not present

## 2023-11-15 NOTE — Therapy (Cosign Needed)
 OUTPATIENT PHYSICAL THERAPY THORACOLUMBAR NOTE      Patient Name: [name] MRN: [id number] DOB:12/25/1950, 73 y.o., female Today's Date: 11/15/2023     END OF SESSION:  PT End of Session - 11/15/23 1140     Visit Number 24    Number of Visits 40    Date for PT Re-Evaluation 11/28/23    Authorization Type [employer]    Authorization Time Period 18 visits approved from 11/13/2023-01/13/2024    Authorization - Visit Number 2    Authorization - Number of Visits 18    Progress Note Due on Visit 32    PT Start Time 1146    PT Stop Time 1230    PT Time Calculation (min) 44 min    Activity Tolerance Patient tolerated treatment well    Behavior During Therapy [facility] for tasks assessed/performed                       Past Medical History:  Diagnosis Date   Anginal pain (HCC) 03/19/2012   saw Dr. Calhoun .. she thinks its more related to stomach issues   Arthritis    all over; mostly in my back (03/20/2012)   Breast cancer (HCC)    right (03/20/2012)   Cardiomyopathy    Chest pain 06/2005   Hospitalized, dystolic dysfunction   Chronic lower back pain    have 2 herniated discs; going to have to have a fusion (03/20/2012)   CKD (chronic kidney disease), stage III (HCC)    Family history of anesthesia complication    father has nausea and vomiting   Gallstones    GERD (gastroesophageal reflux disease)    H/O hiatal hernia    History of kidney stones    History of right mastectomy    Hypertension    patient states never had HTN, takes Hyzaar for heart   Hypothyroidism    Irritable bowel syndrome    Migraines    it's been a long time since I've had one (03/20/2012)   Osteoarthritis    Peripheral neuropathy    PONV (postoperative nausea and vomiting)    Sciatica    Sinus bradycardia    Sleep apnea    mild, no CPAP   Type II diabetes mellitus (HCC)    Past Surgical History:  Procedure Laterality Date   ABDOMINAL HYSTERECTOMY  1993    adenosin cardiolite   07/2005   (+) wall motion abnormality   AXILLARY LYMPH NODE DISSECTION  2003   squamous cell cancer   BACK SURGERY     CARDIAC CATHETERIZATION  ? 2005 / 2007   CHOLECYSTECTOMY N/A 01/24/2018   Procedure: LAPAROSCOPIC CHOLECYSTECTOMY;  Surgeon: Vanderbilt Ned, MD;  Location: [facility] OR;  Service: General;  Laterality: N/A;   CT abdomen and pelvis  02/2010   Same   ESOPHAGOGASTRODUODENOSCOPY  2005   GERD   KNEE ARTHROSCOPY  1990's   right (03/20/2012)   LUMBAR FUSION N/A 08/22/2014   Procedure: Right L2-3 and right L1-2 transforaminal lumbar interbody fusion with pedicle screws, rods, sleeves, cages, local bone graft, Vivigen, cancellous chips;  Surgeon: Lynwood FORBES Better, MD;  Location: [facility] OR;  Service: Orthopedics;  Laterality: N/A;   LUMBAR LAMINECTOMY/DECOMPRESSION MICRODISCECTOMY N/A 02/04/2014   Procedure: RIGHT L2-3 MICRODISCECTOMY ;  Surgeon: Lynwood FORBES Better, MD;  Location: [facility] OR;  Service: Orthopedics;  Laterality: N/A;   MASTECTOMY WITH AXILLARY LYMPH NODE DISSECTION  12/2003   right (03/20/2012)   PARATHYROIDECTOMY Left 10/11/2022  Procedure: MINIMALLY INVASIVE LEFT INFERIOR PARATHYROIDECTOMY;  Surgeon: Rubin Calamity, MD;  Location: [facility] OR;  Service: General;  Laterality: Left;   POSTERIOR CERVICAL FUSION/FORAMINOTOMY  2001   SHOULDER ARTHROSCOPY W/ ROTATOR CUFF REPAIR  2003   Left   THYROIDECTOMY  1977   Right   TOTAL KNEE ARTHROPLASTY  2000   Right   US  of abdomen  07/2005   Fatty liver / kidney stones   Patient Active Problem List   Diagnosis Date Noted   Urinary incontinence 08/09/2023   Metallic taste 05/11/2023   S/P parathyroidectomy 10/11/2022   Allergic rhinitis 07/25/2022   Syncope 07/21/2022   Sinus bradycardia 07/21/2022   Hammer toe of second toe of left foot 05/05/2021   Corns and callosities 05/05/2021   Hyperlipidemia associated with type 2 diabetes mellitus (HCC) 02/01/2021   Depression 02/01/2021   Localized osteoporosis  without current pathological fracture 05/01/2020   Hyperparathyroidism (HCC) 12/13/2019   Elevated hemoglobin (HCC) 12/13/2019   Chronic renal impairment, stage 4 (severe) (HCC) 10/28/2019   Anemia 10/28/2019   Physical exam 06/26/2019   Neck pain 04/18/2019   Ascending aortic aneurysm (HCC) 04/17/2019   Nonspecific chest pain 04/15/2019   Neuropathic pain of right flank 12/27/2018   Dysphagia 12/27/2018   Postsurgical hypothyroidism 06/11/2018   Vitamin D  deficiency 06/11/2018   Multinodular goiter 06/11/2018   DM type 2 causing vascular disease (HCC)    Peripheral neuropathy    Migraine    Irritable bowel syndrome    Essential hypertension    History of right mastectomy    H/O hiatal hernia    GERD (gastroesophageal reflux disease)    Family history of anesthesia complication    Other chronic pain    Type 2 diabetes mellitus with diabetic chronic kidney disease (HCC)    Lower extremity edema    Chronic constipation 06/23/2015   Chronic diastolic heart failure (HCC) 11/17/2014   Herniated nucleus pulposus, L2-3 right 02/04/2014    Class: Acute   Spondylolisthesis of lumbar region 04/30/2012    Class: Chronic   Lumbar stenosis with neurogenic claudication 04/30/2012    Class: Chronic   SYNCOPE-CAROTID SINUS 09/16/2008   OSTEOARTHRITIS 08/23/2006   DDD (degenerative disc disease), cervical 08/23/2006   DISC DISEASE, LUMBAR 08/23/2006   Headache(784.0) 08/23/2006   BREAST CANCER, HX OF 08/23/2006   RENAL CALCULUS, HX OF 08/23/2006    PCP: Mahlon Comer BRAVO, MD   REFERRING PROVIDER: Georgina Ozell LABOR, MD  REFERRING DIAG: M54.50 (ICD-10-CM) - Low back pain, unspecified back pain laterality, unspecified chronicity, unspecified whether sciatica present  Rationale for Evaluation and Treatment: Rehabilitation  THERAPY DIAG:  Muscle weakness (generalized)  Other abnormalities of gait and mobility  Other low back pain  ONSET DATE: chronic   SUBJECTIVE:  SUBJECTIVE STATEMENT: Pt reports no significant pain at entry.    PERTINENT HISTORY:  Chronic LBP, hx Lumbar surgery, CKD, hx breast cancer, DM, neuropathy, osteoporosis, does have spinal cord stimulator  PAIN:  Are you having pain? Yes: NPRS scale: hip 2/10; 6/10 Pain location: low back, abdomen  Pain description: achy Aggravating factors: walking/standing Relieving factors: laying down  PRECAUTIONS: None  WEIGHT BEARING RESTRICTIONS: No  FALLS:  Has patient fallen in last 6 months? No  PLOF: Independent  PATIENT GOALS: stand up and cook   OBJECTIVE: (objective measures from initial evaluation unless otherwise dated)  PATIENT SURVEYS: Modified Oswestry 30/50  5/20: 24/50 10/17/23: 18 / 50 7/24: 11/10/23 30/50  SCREENING FOR RED FLAGS: Bowel or bladder incontinence: No Spinal tumors: No Cauda equina syndrome: No Compression fracture: No Abdominal aneurysm: No  COGNITION: Overall cognitive status: Within functional limits for tasks assessed     SENSATION: WFL  POSTURE: rounded shoulders, forward head, decreased lumbar lordosis, increased thoracic kyphosis, and flexed trunk   PALPATION: TTP lumbar and thoracic paraspinals with greatest tenderness throughout lumbar spine.  LUMBAR ROM:   AROM eval 5/20 11/10/23  Flexion 25% limited Full 0% limited  Extension 90% limited * 40% limited 40% limited  Right lateral flexion 75% limited * 25% limited* 25% limited*  Left lateral flexion 75% limited* 40% limited 25% limited  Right rotation 50% limited* 15% limited 0% limited  Left rotation 50% limited* Full 0% limited   (Blank rows = not tested) * = pain/symptoms  LOWER EXTREMITY ROM:     Active  Right eval Left eval  Hip flexion    Hip extension    Hip abduction    Hip adduction    Hip  internal rotation    Hip external rotation    Knee flexion    Knee extension    Ankle dorsiflexion    Ankle plantarflexion    Ankle inversion    Ankle eversion     (Blank rows = not tested) * = pain/symptoms  LOWER EXTREMITY MMT:  unable to test extension as patient unable to get into prone  MMT Right eval Left eval Right 5/20 Left 5/20 Right 10/17/23 Left 10/17/23 Right 11/10/23 Left 11/10/23  Hip flexion 4+ 4+ 4+ 4 4+ 4+ 5 4+  Hip extension          Hip abduction 4- 3+ 4+ (seated) 4-(seated) 4+ (seated) 4 + (seated) 4+ 4+  Hip adduction          Hip internal rotation          Hip external rotation          Knee flexion 4+ 4+ 4+ 4 4+ 4+ 5 5  Knee extension 4+ 4+ 4+ 4 4+ 4+ 5 5  Ankle dorsiflexion 4+ 4+ 5 4+      Ankle plantarflexion          Ankle inversion          Ankle eversion           (Blank rows = not tested) * = pain/symptoms    FUNCTIONAL TESTS:  5 times sit to stand: 27.59 seconds with UE use 08/08/23:  22.5s with UE use 5/20: 20.03s without UE use (lowered plinth) 10/17/23: 24.58 seconds without UE use in chair 11/10/23: 23.89 seconds with intermittent UE use in chair - increased difficulty due to R hip pain  GAIT: Distance walked: 100 feet Assistive device utilized: Single point cane Level of assistance: Modified independence Comments:  labored, decreased foot clearance  TODAY'S TREATMENT:                                                                                                                              DATE:   11/15/23 -NuStep L5, UE/LE x 5 min  -SKTC 30sec x3ea -LTR x20ea -manual HS and ITB stretching R LE -supine marching with TrA 2x20 -Hooklying oblique reaches 2x10ea -DKTC with LES on physioball x28min -Seated clam with GTB 2x30 -Seated adductor sqz 5 2x10 -Seated assisted partial sit up using pink ball 2x10 -Seated fwd flexion stretch 10 x5 to each side  -PNF chops with pink ball (cues for core engagement) 2x10ea -STS from elevated plinth  2x10   11/13/23 -NuStep L5, UE/LE x 5 min  -SKTC 30sec x3ea -LTR x10ea -supine marching with TrA 2x20 -Hooklying oblique reaches 2x10ea -DKTC with LES on physioball x55min -Seated clam with GTB x30 -Seated adductor sqz 5 2x10 -Seated assisted partial sit up using pink ball 2x10 -Seated fwd flexion stretch 10 x5  -PNF chops with pink ball (cues for core engagement) 2x10ea -STS from elevated plinth 2x10  11/10/23 Reassessment and discussion of POC Standing hip abduction 2 x 10  Standing hip extension 2 x 10   11/07/23 -NuStep L5, UE/LE x 5 min  -SKTC 10sec x5ea -LTR x10ea -supine marching with TrA 2x20 -DKTC with LES on physioball x56min -Seated clam with GTB x30 -Seated adductor sqz 5 2x10 -Seated assisted partial sit up using pink ball 2x10 -Seated fwd flexion stretch 10 x5  Gait in hall with SPC x173ft -PNF chops with pink ball (cues for core engagement) 2x10ea -STS from elevated plinth 2x10  11/02/23 -NuStep L6, UE/LE x 6 min  -SKTC 10sec x5ea -LTR x10ea -supine marching with TrA -DKTC with LES on physioball x74min -Seated clam with GTB x30 -Seated adductor sqz 5 2x10 -Seated assisted partial sit up using pink ball 2x10 -Seated fwd flexion stretch 10 x5  Gait in hall with SPC x153ft -PNF chops with pink ball (cues for core engagement) x10ea -STS from elevated plinth x10   10/31/23 -NuStep L6, UE/LE x 6 min  -standing hip abduction 2x10ea (cues for posture and orientation of hips) -standing hip extension 2x10ea -side stepping at rail x3 laps with cues -hip hinge mechanics at wall x20 (extensive cuing) -Seated fwd flexion stretch 10 x5  Gait in hall with SPC x156ft -PNF chops with pink ball (cues for core engagement) x10ea -STS from elevated plinth 2x10 -step ups 6 x10ea (contralateral rail use)  10/27/23 -NuStep L6, UE/LE x 5 min  -standing hip abduction 2x10ea (cues for posture and orientation of hips) -standing hip extension 2x10ea -side stepping at  rail x1 lap with cues -standing cone taps OH with cues for posture x5ea -hip hinge mechanics at wall x10 (extensive cuing) -Seated fwd flexion stretch 10 x5  Gait in hall with SPC x180ft -PNF chops with pink ball (cues for core engagement) x10ea -STS  from elevated plinth 2x10  10/24/23 -NuStep L5, UE/LE x 5 min  -Seated fwd flexion stretch -Static cupping to lower thoracic and lumbar areas (Seated on stool leaning over pillow on plinth.) -PNF chops with pink ball (cues for core engagement) x10ea -STS from elevated plinth with GTB around thighs 2x10 -standing row with green band 2 x 20  10/20/23 -NuStep L5, UE/LE x 5 min  -Seated fwd flexion stretch -Static cupping to lower thoracic and lumbar areas -STM to upper, mid and lower back (Seated on stool leaning over pillow on plinth.) -Trunk rotation with GTB in standing (cues for posture) x10ea -PNF chops with pink ball (cues for core engagement) x10ea -STS from elevated plinth with GTB around thighs 2x5 -standing row with green band 2 x 10   10/17/23 Reassessment Discussion of symptoms and POC Step up 6 inch 2 x 10      PATIENT EDUCATION:  Education details: exercise rationale, progression/modification 11/10/23 reassessment findings, POC Person educated: Patient Education method: Explanation, Demonstration, Education comprehension: verbalized understanding, returned demonstration, verbal cues required, and tactile cues required  HOME EXERCISE PROGRAM: Access Code: [facility] URL: [email or web address]   ASSESSMENT:  CLINICAL IMPRESSION:  Pt c/o R sided lumbar pain with supine exercises and laying supine on plint. Worked of LE stretching including ITB and HS which are both significantly restricted. Continued to work on [facility] and strengthening for core and hips. Pt will benefit from ongoing PT to work towards goals.   OBJECTIVE IMPAIRMENTS: Abnormal gait, decreased activity tolerance, decreased balance, decreased  endurance, decreased mobility, difficulty walking, decreased ROM, decreased strength, increased muscle spasms, impaired flexibility, impaired sensation, improper body mechanics, postural dysfunction, and pain.   ACTIVITY LIMITATIONS: carrying, lifting, bending, sitting, standing, squatting, stairs, transfers, reach over head, hygiene/grooming, locomotion level, and caring for others  PARTICIPATION LIMITATIONS: meal prep, cleaning, laundry, shopping, community activity, and yard work  PERSONAL FACTORS: Fitness, Time since onset of injury/illness/exacerbation, and 3+ comorbidities: Chronic LBP, hx Lumbar surgery, CKD, hx breast cancer, DM, neuropathy, osteoporosis  are also affecting patient's functional outcome.   REHAB POTENTIAL: Good  CLINICAL DECISION MAKING: Evolving/moderate complexity  EVALUATION COMPLEXITY: Moderate   GOALS: Goals reviewed with patient? Yes  SHORT TERM GOALS: Target date: 08/11/2023    Patient will be independent with HEP in order to improve functional outcomes. Baseline:  Goal status: MET 5/20  2.  Patient will report at least 25% improvement in symptoms for improved quality of life. Baseline:  Goal status: MET 08/11/23   LONG TERM GOALS: Target date: 09/08/2023    Patient will report at least 75% improvement in symptoms for improved quality of life. Baseline:  Goal status: IN PROGRESS  2.  Patient will improve modified oswestry score by at least 8 points in order to indicate improved tolerance to activity. Baseline:  Goal status: MET (improved by 6 points 5/20)  3.  Patient will demonstrate at least 25% improvement in lumbar ROM in all restricted planes for improved ability to move trunk while completing chores and cooking. Baseline:  Goal status: MET 5/20  4.  Patient will be able to complete 5x STS in under 15 seconds in order to demonstrate improved functional mobility/strength Baseline: see above Goal status: In progress - 5/20  5.  Patient  will demonstrate grade of 5/5 MMT grade in all tested musculature as evidence of improved strength to assist with stair ambulation and gait.   Baseline:  Goal status: IN PROGRESS 5/20     PLAN:  PT FREQUENCY: 2x/week  PT DURATION: 8 weeks  PLANNED INTERVENTIONS: 97164- PT Re-evaluation, 97110-Therapeutic exercises, 97530- Therapeutic activity, 97112- Neuromuscular re-education, [phone]- Self Care, 02859- Manual therapy, [phone]- Gait training, [phone]- Orthotic Fit/training, [phone]- Canalith repositioning, [id number]- Aquatic Therapy, [phone]- Splinting, Patient/Family education, Balance training, Stair training, Taping, Dry Needling, Joint mobilization, Joint manipulation, Spinal manipulation, Spinal mobilization, Scar mobilization, and DME instructions.  PLAN FOR NEXT SESSION: f/u with HEP, spinal mobility, core and glute and LE strength, functional strength    Asberry FORBES Rodes, PTA 11/15/2023, 1:52 PM  [facility] [address] [place], [place], [address] Phone: [phone]   Fax:  [phone]    Referring diagnosis? M54.50 (ICD-10-CM) - Low back pain, unspecified back pain laterality, unspecified chronicity, unspecified whether sciatica present Treatment diagnosis? (if different than referring diagnosis) m54.59 What was this (referring dx) caused by? []  Surgery []  Fall [x]  Ongoing issue []  Arthritis []  Other: ____________  Laterality: []  Rt []  Lt [x]  Both  Check all possible CPT codes:  *CHOOSE 10 OR LESS*    See Planned Interventions listed in the Plan section of the Evaluation.

## 2023-11-16 NOTE — Telephone Encounter (Signed)
 Second unsuccessful attempt to reach patient to follow up Continuous Glucose Monitor and insulin .  Her endocrinology office did approved chagne to Novolog  Mix 70/30 from [employer] 70/30mix so she could thru her insurance benefits.  With her insurance Novolog  Mix 70/30 would cost $35/month, She has been buying Relion 70/30 over-the-counter for about $50 per month.  Unable to reach patient or LM on VM.  I tried to contact [facility] to ask about Rx for Novolog  Mix and verify cost. I had to LM on VM with CB# [phone]

## 2023-11-21 ENCOUNTER — Encounter (HOSPITAL_BASED_OUTPATIENT_CLINIC_OR_DEPARTMENT_OTHER): Admitting: Physical Therapy

## 2023-11-21 ENCOUNTER — Encounter (HOSPITAL_BASED_OUTPATIENT_CLINIC_OR_DEPARTMENT_OTHER): Payer: Self-pay | Admitting: Physical Therapy

## 2023-11-21 ENCOUNTER — Ambulatory Visit (HOSPITAL_BASED_OUTPATIENT_CLINIC_OR_DEPARTMENT_OTHER): Payer: Self-pay | Admitting: Physical Therapy

## 2023-11-21 DIAGNOSIS — R2689 Other abnormalities of gait and mobility: Secondary | ICD-10-CM | POA: Diagnosis not present

## 2023-11-21 DIAGNOSIS — M6281 Muscle weakness (generalized): Secondary | ICD-10-CM

## 2023-11-21 DIAGNOSIS — M5459 Other low back pain: Secondary | ICD-10-CM

## 2023-11-21 NOTE — Therapy (Signed)
 OUTPATIENT PHYSICAL THERAPY THORACOLUMBAR NOTE      Patient Name: DECLAN ADAMSON MRN: [id number] DOB:[birth year], 73 y.o., female Today's Date: 11/21/2023     END OF SESSION:  PT End of Session - 11/21/23 1101     Visit Number 25    Number of Visits 40    Date for PT Re-Evaluation 11/28/23    Authorization Type [employer]    Authorization Time Period 18 visits approved from 11/13/2023-01/13/2024    Authorization - Visit Number 3    Authorization - Number of Visits 18    Progress Note Due on Visit 32    PT Start Time 1101    PT Stop Time 1139    PT Time Calculation (min) 38 min    Activity Tolerance Patient tolerated treatment well    Behavior During Therapy [facility] for tasks assessed/performed                       Past Medical History:  Diagnosis Date   Anginal pain (HCC) 03/19/2012   saw Dr. Calhoun .. she thinks its more related to stomach issues   Arthritis    all over; mostly in my back (03/20/2012)   Breast cancer (HCC)    right (03/20/2012)   Cardiomyopathy    Chest pain 06/2005   Hospitalized, dystolic dysfunction   Chronic lower back pain    have 2 herniated discs; going to have to have a fusion (03/20/2012)   CKD (chronic kidney disease), stage III (HCC)    Family history of anesthesia complication    father has nausea and vomiting   Gallstones    GERD (gastroesophageal reflux disease)    H/O hiatal hernia    History of kidney stones    History of right mastectomy    Hypertension    patient states never had HTN, takes Hyzaar for heart   Hypothyroidism    Irritable bowel syndrome    Migraines    it's been a long time since I've had one (03/20/2012)   Osteoarthritis    Peripheral neuropathy    PONV (postoperative nausea and vomiting)    Sciatica    Sinus bradycardia    Sleep apnea    mild, no CPAP   Type II diabetes mellitus (HCC)    Past Surgical History:  Procedure Laterality Date   ABDOMINAL HYSTERECTOMY  1993    adenosin cardiolite   07/2005   (+) wall motion abnormality   AXILLARY LYMPH NODE DISSECTION  2003   squamous cell cancer   BACK SURGERY     CARDIAC CATHETERIZATION  ? 2005 / 2007   CHOLECYSTECTOMY N/A 01/24/2018   Procedure: LAPAROSCOPIC CHOLECYSTECTOMY;  Surgeon: Vanderbilt Ned, MD;  Location: [facility] OR;  Service: General;  Laterality: N/A;   CT abdomen and pelvis  02/2010   Same   ESOPHAGOGASTRODUODENOSCOPY  2005   GERD   KNEE ARTHROSCOPY  1990's   right (03/20/2012)   LUMBAR FUSION N/A 08/22/2014   Procedure: Right L2-3 and right L1-2 transforaminal lumbar interbody fusion with pedicle screws, rods, sleeves, cages, local bone graft, Vivigen, cancellous chips;  Surgeon: Lynwood FORBES Better, MD;  Location: [facility] OR;  Service: Orthopedics;  Laterality: N/A;   LUMBAR LAMINECTOMY/DECOMPRESSION MICRODISCECTOMY N/A 02/04/2014   Procedure: RIGHT L2-3 MICRODISCECTOMY ;  Surgeon: Lynwood FORBES Better, MD;  Location: [facility] OR;  Service: Orthopedics;  Laterality: N/A;   MASTECTOMY WITH AXILLARY LYMPH NODE DISSECTION  12/2003   right (03/20/2012)   PARATHYROIDECTOMY Left 10/11/2022  Procedure: MINIMALLY INVASIVE LEFT INFERIOR PARATHYROIDECTOMY;  Surgeon: Rubin Calamity, MD;  Location: [facility] OR;  Service: General;  Laterality: Left;   POSTERIOR CERVICAL FUSION/FORAMINOTOMY  2001   SHOULDER ARTHROSCOPY W/ ROTATOR CUFF REPAIR  2003   Left   THYROIDECTOMY  1977   Right   TOTAL KNEE ARTHROPLASTY  2000   Right   US  of abdomen  07/2005   Fatty liver / kidney stones   Patient Active Problem List   Diagnosis Date Noted   Urinary incontinence 08/09/2023   Metallic taste 05/11/2023   S/P parathyroidectomy 10/11/2022   Allergic rhinitis 07/25/2022   Syncope 07/21/2022   Sinus bradycardia 07/21/2022   Hammer toe of second toe of left foot 05/05/2021   Corns and callosities 05/05/2021   Hyperlipidemia associated with type 2 diabetes mellitus (HCC) 02/01/2021   Depression 02/01/2021   Localized osteoporosis  without current pathological fracture 05/01/2020   Hyperparathyroidism (HCC) 12/13/2019   Elevated hemoglobin (HCC) 12/13/2019   Chronic renal impairment, stage 4 (severe) (HCC) 10/28/2019   Anemia 10/28/2019   Physical exam 06/26/2019   Neck pain 04/18/2019   Ascending aortic aneurysm (HCC) 04/17/2019   Nonspecific chest pain 04/15/2019   Neuropathic pain of right flank 12/27/2018   Dysphagia 12/27/2018   Postsurgical hypothyroidism 06/11/2018   Vitamin D  deficiency 06/11/2018   Multinodular goiter 06/11/2018   DM type 2 causing vascular disease (HCC)    Peripheral neuropathy    Migraine    Irritable bowel syndrome    Essential hypertension    History of right mastectomy    H/O hiatal hernia    GERD (gastroesophageal reflux disease)    Family history of anesthesia complication    Other chronic pain    Type 2 diabetes mellitus with diabetic chronic kidney disease (HCC)    Lower extremity edema    Chronic constipation 06/23/2015   Chronic diastolic heart failure (HCC) 11/17/2014   Herniated nucleus pulposus, L2-3 right 02/04/2014    Class: Acute   Spondylolisthesis of lumbar region 04/30/2012    Class: Chronic   Lumbar stenosis with neurogenic claudication 04/30/2012    Class: Chronic   SYNCOPE-CAROTID SINUS 09/16/2008   OSTEOARTHRITIS 08/23/2006   DDD (degenerative disc disease), cervical 08/23/2006   DISC DISEASE, LUMBAR 08/23/2006   Headache(784.0) 08/23/2006   BREAST CANCER, HX OF 08/23/2006   RENAL CALCULUS, HX OF 08/23/2006    PCP: Mahlon Comer BRAVO, MD   REFERRING PROVIDER: Georgina Ozell LABOR, MD  REFERRING DIAG: M54.50 (ICD-10-CM) - Low back pain, unspecified back pain laterality, unspecified chronicity, unspecified whether sciatica present  Rationale for Evaluation and Treatment: Rehabilitation  THERAPY DIAG:  Muscle weakness (generalized)  Other abnormalities of gait and mobility  Other low back pain  ONSET DATE: chronic   SUBJECTIVE:  SUBJECTIVE STATEMENT: Pt reports good days and bad days.    PERTINENT HISTORY:  Chronic LBP, hx Lumbar surgery, CKD, hx breast cancer, DM, neuropathy, osteoporosis, does have spinal cord stimulator  PAIN:  Are you having pain? Yes: NPRS scale: hip 2/10; 6/10 Pain location: low back, abdomen  Pain description: achy Aggravating factors: walking/standing Relieving factors: laying down  PRECAUTIONS: None  WEIGHT BEARING RESTRICTIONS: No  FALLS:  Has patient fallen in last 6 months? No  PLOF: Independent  PATIENT GOALS: stand up and cook   OBJECTIVE: (objective measures from initial evaluation unless otherwise dated)  PATIENT SURVEYS: Modified Oswestry 30/50  5/20: 24/50 10/17/23: 18 / 50 7/24: 11/10/23 30/50  SCREENING FOR RED FLAGS: Bowel or bladder incontinence: No Spinal tumors: No Cauda equina syndrome: No Compression fracture: No Abdominal aneurysm: No  COGNITION: Overall cognitive status: Within functional limits for tasks assessed     SENSATION: WFL  POSTURE: rounded shoulders, forward head, decreased lumbar lordosis, increased thoracic kyphosis, and flexed trunk   PALPATION: TTP lumbar and thoracic paraspinals with greatest tenderness throughout lumbar spine.  LUMBAR ROM:   AROM eval 5/20 11/10/23  Flexion 25% limited Full 0% limited  Extension 90% limited * 40% limited 40% limited  Right lateral flexion 75% limited * 25% limited* 25% limited*  Left lateral flexion 75% limited* 40% limited 25% limited  Right rotation 50% limited* 15% limited 0% limited  Left rotation 50% limited* Full 0% limited   (Blank rows = not tested) * = pain/symptoms  LOWER EXTREMITY ROM:     Active  Right eval Left eval  Hip flexion    Hip extension    Hip abduction    Hip adduction    Hip internal  rotation    Hip external rotation    Knee flexion    Knee extension    Ankle dorsiflexion    Ankle plantarflexion    Ankle inversion    Ankle eversion     (Blank rows = not tested) * = pain/symptoms  LOWER EXTREMITY MMT:  unable to test extension as patient unable to get into prone  MMT Right eval Left eval Right 5/20 Left 5/20 Right 10/17/23 Left 10/17/23 Right 11/10/23 Left 11/10/23  Hip flexion 4+ 4+ 4+ 4 4+ 4+ 5 4+  Hip extension          Hip abduction 4- 3+ 4+ (seated) 4-(seated) 4+ (seated) 4 + (seated) 4+ 4+  Hip adduction          Hip internal rotation          Hip external rotation          Knee flexion 4+ 4+ 4+ 4 4+ 4+ 5 5  Knee extension 4+ 4+ 4+ 4 4+ 4+ 5 5  Ankle dorsiflexion 4+ 4+ 5 4+      Ankle plantarflexion          Ankle inversion          Ankle eversion           (Blank rows = not tested) * = pain/symptoms    FUNCTIONAL TESTS:  5 times sit to stand: 27.59 seconds with UE use 08/08/23:  22.5s with UE use 5/20: 20.03s without UE use (lowered plinth) 10/17/23: 24.58 seconds without UE use in chair 11/10/23: 23.89 seconds with intermittent UE use in chair - increased difficulty due to R hip pain  GAIT: Distance walked: 100 feet Assistive device utilized: Single point cane Level of assistance: Modified independence Comments:  labored, decreased foot clearance  TODAY'S TREATMENT:                                                                                                                              DATE:  11/21/23 NuStep L5, UE/LE x 6 min Standing hip abduction RTB at knees 2 x 10  Standing hip extension RTB at knees 3 x 10  Seated trunk flexion stretch 5 x 10 second holds Lateral step up and over 2 x 10 Seated Row RTB 3 x 15 Seated shoulder extension RTB 2 x 20 Seated ab iso with green ball 2 x 10 with 5 second holds STS from elevated plinth 1x10  11/15/23 -NuStep L5, UE/LE x 5 min  -SKTC 30sec x3ea -LTR x20ea -manual HS and ITB stretching R  LE -supine marching with TrA 2x20 -Hooklying oblique reaches 2x10ea -DKTC with LES on physioball x31min -Seated clam with GTB 2x30 -Seated adductor sqz 5 2x10 -Seated assisted partial sit up using pink ball 2x10 -Seated fwd flexion stretch 10 x5 to each side  -PNF chops with pink ball (cues for core engagement) 2x10ea -STS from elevated plinth 2x10   11/13/23 -NuStep L5, UE/LE x 5 min  -SKTC 30sec x3ea -LTR x10ea -supine marching with TrA 2x20 -Hooklying oblique reaches 2x10ea -DKTC with LES on physioball x22min -Seated clam with GTB x30 -Seated adductor sqz 5 2x10 -Seated assisted partial sit up using pink ball 2x10 -Seated fwd flexion stretch 10 x5  -PNF chops with pink ball (cues for core engagement) 2x10ea -STS from elevated plinth 2x10  11/10/23 Reassessment and discussion of POC Standing hip abduction 2 x 10  Standing hip extension 2 x 10   11/07/23 -NuStep L5, UE/LE x 5 min  -SKTC 10sec x5ea -LTR x10ea -supine marching with TrA 2x20 -DKTC with LES on physioball x36min -Seated clam with GTB x30 -Seated adductor sqz 5 2x10 -Seated assisted partial sit up using pink ball 2x10 -Seated fwd flexion stretch 10 x5  Gait in hall with SPC x129ft -PNF chops with pink ball (cues for core engagement) 2x10ea -STS from elevated plinth 2x10  11/02/23 -NuStep L6, UE/LE x 6 min  -SKTC 10sec x5ea -LTR x10ea -supine marching with TrA -DKTC with LES on physioball x57min -Seated clam with GTB x30 -Seated adductor sqz 5 2x10 -Seated assisted partial sit up using pink ball 2x10 -Seated fwd flexion stretch 10 x5  Gait in hall with SPC x166ft -PNF chops with pink ball (cues for core engagement) x10ea -STS from elevated plinth x10   10/31/23 -NuStep L6, UE/LE x 6 min  -standing hip abduction 2x10ea (cues for posture and orientation of hips) -standing hip extension 2x10ea -side stepping at rail x3 laps with cues -hip hinge mechanics at wall x20 (extensive cuing) -Seated fwd  flexion stretch 10 x5  Gait in hall with SPC x133ft -PNF chops with pink ball (cues for core engagement) x10ea -STS from elevated plinth 2x10 -step ups 6 x10ea (contralateral  rail use)  10/27/23 -NuStep L6, UE/LE x 5 min  -standing hip abduction 2x10ea (cues for posture and orientation of hips) -standing hip extension 2x10ea -side stepping at rail x1 lap with cues -standing cone taps OH with cues for posture x5ea -hip hinge mechanics at wall x10 (extensive cuing) -Seated fwd flexion stretch 10 x5  Gait in hall with SPC x155ft -PNF chops with pink ball (cues for core engagement) x10ea -STS from elevated plinth 2x10  10/24/23 -NuStep L5, UE/LE x 5 min  -Seated fwd flexion stretch -Static cupping to lower thoracic and lumbar areas (Seated on stool leaning over pillow on plinth.) -PNF chops with pink ball (cues for core engagement) x10ea -STS from elevated plinth with GTB around thighs 2x10 -standing row with green band 2 x 20  10/20/23 -NuStep L5, UE/LE x 5 min  -Seated fwd flexion stretch -Static cupping to lower thoracic and lumbar areas -STM to upper, mid and lower back (Seated on stool leaning over pillow on plinth.) -Trunk rotation with GTB in standing (cues for posture) x10ea -PNF chops with pink ball (cues for core engagement) x10ea -STS from elevated plinth with GTB around thighs 2x5 -standing row with green band 2 x 10   10/17/23 Reassessment Discussion of symptoms and POC Step up 6 inch 2 x 10      PATIENT EDUCATION:  Education details: exercise rationale, progression/modification 11/10/23 reassessment findings, POC Person educated: Patient Education method: Explanation, Demonstration, Education comprehension: verbalized understanding, returned demonstration, verbal cues required, and tactile cues required  HOME EXERCISE PROGRAM: Access Code: [facility] URL: [email or web address]   ASSESSMENT:  CLINICAL IMPRESSION:  Pt continues to progress  well with strengthening exercises. Patient stating moderate to high fatigue after standing exercises, resisted postural and core strengthening for active rest break. Patient will continue to benefit from physical therapy in order to improve function and reduce impairment.   OBJECTIVE IMPAIRMENTS: Abnormal gait, decreased activity tolerance, decreased balance, decreased endurance, decreased mobility, difficulty walking, decreased ROM, decreased strength, increased muscle spasms, impaired flexibility, impaired sensation, improper body mechanics, postural dysfunction, and pain.   ACTIVITY LIMITATIONS: carrying, lifting, bending, sitting, standing, squatting, stairs, transfers, reach over head, hygiene/grooming, locomotion level, and caring for others  PARTICIPATION LIMITATIONS: meal prep, cleaning, laundry, shopping, community activity, and yard work  PERSONAL FACTORS: Fitness, Time since onset of injury/illness/exacerbation, and 3+ comorbidities: Chronic LBP, hx Lumbar surgery, CKD, hx breast cancer, DM, neuropathy, osteoporosis  are also affecting patient's functional outcome.   REHAB POTENTIAL: Good  CLINICAL DECISION MAKING: Evolving/moderate complexity  EVALUATION COMPLEXITY: Moderate   GOALS: Goals reviewed with patient? Yes  SHORT TERM GOALS: Target date: 08/11/2023    Patient will be independent with HEP in order to improve functional outcomes. Baseline:  Goal status: MET 5/20  2.  Patient will report at least 25% improvement in symptoms for improved quality of life. Baseline:  Goal status: MET 08/11/23   LONG TERM GOALS: Target date: 09/08/2023    Patient will report at least 75% improvement in symptoms for improved quality of life. Baseline:  Goal status: IN PROGRESS  2.  Patient will improve modified oswestry score by at least 8 points in order to indicate improved tolerance to activity. Baseline:  Goal status: MET (improved by 6 points 5/20)  3.  Patient will  demonstrate at least 25% improvement in lumbar ROM in all restricted planes for improved ability to move trunk while completing chores and cooking. Baseline:  Goal status: MET 5/20  4.  Patient will be able  to complete 5x STS in under 15 seconds in order to demonstrate improved functional mobility/strength Baseline: see above Goal status: In progress - 5/20  5.  Patient will demonstrate grade of 5/5 MMT grade in all tested musculature as evidence of improved strength to assist with stair ambulation and gait.   Baseline:  Goal status: IN PROGRESS 5/20     PLAN:  PT FREQUENCY: 2x/week  PT DURATION: 8 weeks  PLANNED INTERVENTIONS: 97164- PT Re-evaluation, 97110-Therapeutic exercises, 97530- Therapeutic activity, 97112- Neuromuscular re-education, 97535- Self Care, [id number]- Manual therapy, [phone]- Gait training, [phone]- Orthotic Fit/training, [phone]- Canalith repositioning, [id number]- Aquatic Therapy, [phone]- Splinting, Patient/Family education, Balance training, Stair training, Taping, Dry Needling, Joint mobilization, Joint manipulation, Spinal manipulation, Spinal mobilization, Scar mobilization, and DME instructions.  PLAN FOR NEXT SESSION: f/u with HEP, spinal mobility, core and glute and LE strength, functional strength    Prentice GORMAN Stains, PT, DPT 11/21/2023, 11:37 AM  [facility] [address] [place], [place], [address] Phone: [phone]   Fax:  [phone]    Referring diagnosis? M54.50 (ICD-10-CM) - Low back pain, unspecified back pain laterality, unspecified chronicity, unspecified whether sciatica present Treatment diagnosis? (if different than referring diagnosis) m54.59 What was this (referring dx) caused by? []  Surgery []  Fall [x]  Ongoing issue []  Arthritis []  Other: ____________  Laterality: []  Rt []  Lt [x]  Both  Check all possible CPT codes:  *CHOOSE 10 OR LESS*    See Planned Interventions listed in the  Plan section of the Evaluation.

## 2023-11-24 ENCOUNTER — Encounter (HOSPITAL_BASED_OUTPATIENT_CLINIC_OR_DEPARTMENT_OTHER)

## 2023-11-28 ENCOUNTER — Encounter (HOSPITAL_BASED_OUTPATIENT_CLINIC_OR_DEPARTMENT_OTHER): Admitting: Physical Therapy

## 2023-12-14 ENCOUNTER — Ambulatory Visit: Admitting: Family Medicine

## 2023-12-14 ENCOUNTER — Encounter (HOSPITAL_BASED_OUTPATIENT_CLINIC_OR_DEPARTMENT_OTHER): Payer: Self-pay

## 2023-12-14 ENCOUNTER — Encounter: Payer: Self-pay | Admitting: Family Medicine

## 2023-12-14 ENCOUNTER — Ambulatory Visit (HOSPITAL_BASED_OUTPATIENT_CLINIC_OR_DEPARTMENT_OTHER): Attending: Orthopedic Surgery

## 2023-12-14 VITALS — BP 122/70 | HR 63 | Temp 98.0°F | Resp 12 | Ht 64.0 in | Wt 191.8 lb

## 2023-12-14 DIAGNOSIS — M5459 Other low back pain: Secondary | ICD-10-CM | POA: Diagnosis not present

## 2023-12-14 DIAGNOSIS — H6993 Unspecified Eustachian tube disorder, bilateral: Secondary | ICD-10-CM

## 2023-12-14 DIAGNOSIS — M6281 Muscle weakness (generalized): Secondary | ICD-10-CM | POA: Insufficient documentation

## 2023-12-14 DIAGNOSIS — R2689 Other abnormalities of gait and mobility: Secondary | ICD-10-CM | POA: Diagnosis not present

## 2023-12-14 DIAGNOSIS — E039 Hypothyroidism, unspecified: Secondary | ICD-10-CM | POA: Diagnosis not present

## 2023-12-14 DIAGNOSIS — E66812 Obesity, class 2: Secondary | ICD-10-CM | POA: Insufficient documentation

## 2023-12-14 DIAGNOSIS — E1165 Type 2 diabetes mellitus with hyperglycemia: Secondary | ICD-10-CM | POA: Insufficient documentation

## 2023-12-14 MED ORDER — LEVOTHYROXINE SODIUM 100 MCG PO TABS: 100.0000 ug | ORAL_TABLET | Freq: Every day | ORAL | 3 refills | Status: AC

## 2023-12-14 MED ORDER — FLUTICASONE PROPIONATE 50 MCG/ACT NA SUSP: 2.0000 | Freq: Every day | NASAL | 6 refills | Status: AC

## 2023-12-14 NOTE — Progress Notes (Signed)
   Subjective:    Patient ID: [name], female    DOB: 05-16-1950, 72 y.o.   MRN: [id number]  HPI Ear pain- L>R.  Had a hearing aid evaluation and was told that there is fluid in her ears.  No drainage from the ear.  No fever.  When pain first started a few days ago, was intermittent.  Pain is now constant.  Taking Zyrtec  daily  Back mass- pt reports she has a 'lump' on her R upper back that is 'dark'.  Not painful.  Concerned bc she can't see it.  Fearful of cancer  Review of Systems For ROS see HPI     Objective:   Physical Exam Vitals reviewed.  Constitutional:      Appearance: Normal appearance.  HENT:     Head: Normocephalic and atraumatic.     Right Ear: A middle ear effusion is present. Tympanic membrane is retracted.     Left Ear: A middle ear effusion is present. Tympanic membrane is retracted.     Nose: Mucosal edema and congestion present.     Right Turbinates: Swollen.     Left Turbinates: Swollen.     Mouth/Throat:     Mouth: Mucous membranes are moist.     Pharynx: Posterior oropharyngeal erythema (w/ copious PND) present.  Eyes:     Extraocular Movements: Extraocular movements intact.     Conjunctiva/sclera: Conjunctivae normal.  Musculoskeletal:     Cervical back: Neck supple.  Lymphadenopathy:     Cervical: No cervical adenopathy.  Skin:    General: Skin is warm and dry.     Findings: Lesion (hyperpigmented SK on R upper back) present.  Neurological:     General: No focal deficit present.     Mental Status: She is alert and oriented to person, place, and time.  Psychiatric:        Mood and Affect: Mood normal.        Behavior: Behavior normal.        Thought Content: Thought content normal.           Assessment & Plan:  Eustachian tube dysfxn- new.  Pt's ear pain and PE consistent w/ eustachian tube dysfxn.  Continue Zyrtec  daily, add Flonase .  Reviewed supportive care.  SK- new.  Reassured pt that there is no mass and no evidence of skin  cancer.

## 2023-12-14 NOTE — Patient Instructions (Signed)
 Follow up as needed or as scheduled CONTINUE the Zyrtec  daily ADD the Flonase - 2 sprays each nostril once daily The area on your back is perfectly safe- it's a seborrheic keratosis Call with any questions or concerns Stay Safe!  Stay Healthy! Hang in there!!!

## 2023-12-14 NOTE — Therapy (Addendum)
 OUTPATIENT PHYSICAL THERAPY THORACOLUMBAR NOTE Progress Note Reporting Period 10/17/2023 to 12/14/2023  See note below for Objective Data and Assessment of Progress/Goals.          Patient Name: [name] MRN: [id number] DOB:July 16, 1950, 73 y.o., female Today's Date: 12/15/2023     END OF SESSION:  PT End of Session - 12/14/23 1148     Visit Number 26    Number of Visits 40    Date for PT Re-Evaluation 11/28/23    Authorization Type [employer]    Authorization Time Period 18 visits approved from 11/13/2023-01/13/2024    Authorization - Visit Number 4    Authorization - Number of Visits 18    Progress Note Due on Visit 32    PT Start Time 1146    PT Stop Time 1230    PT Time Calculation (min) 44 min    Activity Tolerance Patient tolerated treatment well    Behavior During Therapy [facility] for tasks assessed/performed                        Past Medical History:  Diagnosis Date   Anginal pain (HCC) 03/19/2012   saw Dr. Calhoun .. she thinks its more related to stomach issues   Arthritis    all over; mostly in my back (03/20/2012)   Breast cancer (HCC)    right (03/20/2012)   Cardiomyopathy    Chest pain 06/2005   Hospitalized, dystolic dysfunction   Chronic lower back pain    have 2 herniated discs; going to have to have a fusion (03/20/2012)   CKD (chronic kidney disease), stage III (HCC)    Family history of anesthesia complication    father has nausea and vomiting   Gallstones    GERD (gastroesophageal reflux disease)    H/O hiatal hernia    History of kidney stones    History of right mastectomy    Hypertension    patient states never had HTN, takes Hyzaar for heart   Hypothyroidism    Irritable bowel syndrome    Migraines    it's been a long time since I've had one (03/20/2012)   Osteoarthritis    Peripheral neuropathy    PONV (postoperative nausea and vomiting)    Sciatica    Sinus bradycardia    Sleep apnea     mild, no CPAP   Type II diabetes mellitus (HCC)    Past Surgical History:  Procedure Laterality Date   ABDOMINAL HYSTERECTOMY  1993   adenosin cardiolite   07/2005   (+) wall motion abnormality   AXILLARY LYMPH NODE DISSECTION  2003   squamous cell cancer   BACK SURGERY     CARDIAC CATHETERIZATION  ? 2005 / 2007   CHOLECYSTECTOMY N/A 01/24/2018   Procedure: LAPAROSCOPIC CHOLECYSTECTOMY;  Surgeon: Vanderbilt Ned, MD;  Location: [facility] OR;  Service: General;  Laterality: N/A;   CT abdomen and pelvis  02/2010   Same   ESOPHAGOGASTRODUODENOSCOPY  2005   GERD   KNEE ARTHROSCOPY  1990's   right (03/20/2012)   LUMBAR FUSION N/A 08/22/2014   Procedure: Right L2-3 and right L1-2 transforaminal lumbar interbody fusion with pedicle screws, rods, sleeves, cages, local bone graft, Vivigen, cancellous chips;  Surgeon: Lynwood FORBES Better, MD;  Location: [facility] OR;  Service: Orthopedics;  Laterality: N/A;   LUMBAR LAMINECTOMY/DECOMPRESSION MICRODISCECTOMY N/A 02/04/2014   Procedure: RIGHT L2-3 MICRODISCECTOMY ;  Surgeon: Lynwood FORBES Better, MD;  Location: [facility] OR;  Service: Orthopedics;  Laterality: N/A;   MASTECTOMY WITH AXILLARY LYMPH NODE DISSECTION  12/2003   right (03/20/2012)   PARATHYROIDECTOMY Left 10/11/2022   Procedure: MINIMALLY INVASIVE LEFT INFERIOR PARATHYROIDECTOMY;  Surgeon: Rubin Calamity, MD;  Location: [facility] OR;  Service: General;  Laterality: Left;   POSTERIOR CERVICAL FUSION/FORAMINOTOMY  2001   SHOULDER ARTHROSCOPY W/ ROTATOR CUFF REPAIR  2003   Left   THYROIDECTOMY  1977   Right   TOTAL KNEE ARTHROPLASTY  2000   Right   US  of abdomen  07/2005   Fatty liver / kidney stones   Patient Active Problem List   Diagnosis Date Noted   Class 2 obesity 12/14/2023   Hyperglycemia due to type 2 diabetes mellitus (HCC) 12/14/2023   Urinary incontinence 08/09/2023   Metallic taste 05/11/2023   S/P parathyroidectomy 10/11/2022   Allergic rhinitis 07/25/2022   Syncope 07/21/2022   Sinus bradycardia  07/21/2022   Hammer toe of second toe of left foot 05/05/2021   Corns and callosities 05/05/2021   Hyperlipidemia associated with type 2 diabetes mellitus (HCC) 02/01/2021   Depression 02/01/2021   Localized osteoporosis without current pathological fracture 05/01/2020   Hyperparathyroidism (HCC) 12/13/2019   Elevated hemoglobin (HCC) 12/13/2019   Chronic renal impairment, stage 4 (severe) (HCC) 10/28/2019   Anemia 10/28/2019   Physical exam 06/26/2019   Neck pain 04/18/2019   Ascending aortic aneurysm (HCC) 04/17/2019   Nonspecific chest pain 04/15/2019   Neuropathic pain of right flank 12/27/2018   Dysphagia 12/27/2018   Postsurgical hypothyroidism 06/11/2018   Vitamin D  deficiency 06/11/2018   Multinodular goiter 06/11/2018   DM type 2 causing vascular disease (HCC)    Peripheral neuropathy    Migraine    Irritable bowel syndrome    Essential hypertension    History of right mastectomy    H/O hiatal hernia    GERD (gastroesophageal reflux disease)    Family history of anesthesia complication    Other chronic pain    Type 2 diabetes mellitus with diabetic chronic kidney disease (HCC)    Lower extremity edema    Chronic constipation 06/23/2015   Chronic diastolic heart failure (HCC) 11/17/2014   Herniated nucleus pulposus, L2-3 right 02/04/2014    Class: Acute   Spondylolisthesis of lumbar region 04/30/2012    Class: Chronic   Lumbar stenosis with neurogenic claudication 04/30/2012    Class: Chronic   SYNCOPE-CAROTID SINUS 09/16/2008   OSTEOARTHRITIS 08/23/2006   DDD (degenerative disc disease), cervical 08/23/2006   DISC DISEASE, LUMBAR 08/23/2006   Headache(784.0) 08/23/2006   BREAST CANCER, HX OF 08/23/2006   RENAL CALCULUS, HX OF 08/23/2006    PCP: Mahlon Comer BRAVO, MD   REFERRING PROVIDER: Georgina Ozell LABOR, MD  REFERRING DIAG: M54.50 (ICD-10-CM) - Low back pain, unspecified back pain laterality, unspecified chronicity, unspecified whether sciatica  present  Rationale for Evaluation and Treatment: Rehabilitation  THERAPY DIAG:  Muscle weakness (generalized)  Other abnormalities of gait and mobility  Other low back pain  ONSET DATE: chronic   SUBJECTIVE:  SUBJECTIVE STATEMENT: Pt reports her back has been feeling worse than usual, unsure why. Pt reports she can stand up a little longer than before. Still unable to stand to cook an entire meal. Pt reports she is able to walk better than before and can walk without her cane in the house. Reports partial HEP compliance.    PERTINENT HISTORY:  Chronic LBP, hx Lumbar surgery, CKD, hx breast cancer, DM, neuropathy, osteoporosis, does have spinal cord stimulator  PAIN:  Are you having pain? Yes: NPRS scale: R hip 5/10; 8/10 Pain location: low back, abdomen  Pain description: achy Aggravating factors: walking/standing Relieving factors: laying down  PRECAUTIONS: None  WEIGHT BEARING RESTRICTIONS: No  FALLS:  Has patient fallen in last 6 months? No  PLOF: Independent  PATIENT GOALS: stand up and cook   OBJECTIVE: (objective measures from initial evaluation unless otherwise dated)  PATIENT SURVEYS: Modified Oswestry 30/50  5/20: 24/50 10/17/23: 18 / 50 7/24: 11/10/23 30/50 12/14/23: 24/50 (48%)  SCREENING FOR RED FLAGS: Bowel or bladder incontinence: No Spinal tumors: No Cauda equina syndrome: No Compression fracture: No Abdominal aneurysm: No  COGNITION: Overall cognitive status: Within functional limits for tasks assessed     SENSATION: WFL  POSTURE: rounded shoulders, forward head, decreased lumbar lordosis, increased thoracic kyphosis, and flexed trunk   PALPATION: TTP lumbar and thoracic paraspinals with greatest tenderness throughout lumbar spine.  LUMBAR ROM:   AROM eval  5/20 11/10/23 12/14/23  Flexion 25% limited Full 0% limited 0% limited   Extension 90% limited * 40% limited 40% limited 40% limited  Right lateral flexion 75% limited * 25% limited* 25% limited* 10%limited  Left lateral flexion 75% limited* 40% limited 25% limited 10% limited  Right rotation 50% limited* 15% limited 0% limited 10% limited  Left rotation 50% limited* Full 0% limited 10% limited   (Blank rows = not tested) * = pain/symptoms    LOWER EXTREMITY MMT:  unable to test extension as patient unable to get into prone  MMT Right eval Left eval Right 5/20 Left 5/20 Right 10/17/23 Left 10/17/23 Right 11/10/23 Left 11/10/23 Right 12/14/23 Left 12/14/23  Hip flexion 4+ 4+ 4+ 4 4+ 4+ 5 4+ 5 4+  Hip extension            Hip abduction 4- 3+ 4+ (seated) 4-(seated) 4+ (seated) 4 + (seated) 4+ 4+ 5 5  Hip adduction            Hip internal rotation            Hip external rotation            Knee flexion 4+ 4+ 4+ 4 4+ 4+ 5 5 5 5   Knee extension 4+ 4+ 4+ 4 4+ 4+ 5 5 5 5   Ankle dorsiflexion 4+ 4+ 5 4+        Ankle plantarflexion            Ankle inversion            Ankle eversion             (Blank rows = not tested) * = pain/symptoms    FUNCTIONAL TESTS:  5 times sit to stand: 27.59 seconds with UE use 08/08/23:  22.5s with UE use 5/20: 20.03s without UE use (lowered plinth) 10/17/23: 24.58 seconds without UE use in chair 11/10/23: 23.89 seconds with intermittent UE use in chair - increased difficulty due to R hip pain 12/14/23: 40.81 with intermittent UE use in chair-L knee  pain  GAIT: Distance walked: 100 feet Assistive device utilized: Single point cane Level of assistance: Modified independence Comments: labored, decreased foot clearance  TODAY'S TREATMENT:                                                                                                                              DATE:    12/14/23 Nu step L3 x61min MODI Updated MMT and ROM 5xSTS Review of goals Sit to stands 2x10  (cues for technique)    11/21/23 NuStep L5, UE/LE x 6 min Standing hip abduction RTB at knees 2 x 10  Standing hip extension RTB at knees 3 x 10  Seated trunk flexion stretch 5 x 10 second holds Lateral step up and over 2 x 10 Seated Row RTB 3 x 15 Seated shoulder extension RTB 2 x 20 Seated ab iso with green ball 2 x 10 with 5 second holds STS from elevated plinth 1x10  11/15/23 -NuStep L5, UE/LE x 5 min  -SKTC 30sec x3ea -LTR x20ea -manual HS and ITB stretching R LE -supine marching with TrA 2x20 -Hooklying oblique reaches 2x10ea -DKTC with LES on physioball x72min -Seated clam with GTB 2x30 -Seated adductor sqz 5 2x10 -Seated assisted partial sit up using pink ball 2x10 -Seated fwd flexion stretch 10 x5 to each side  -PNF chops with pink ball (cues for core engagement) 2x10ea -STS from elevated plinth 2x10   11/13/23 -NuStep L5, UE/LE x 5 min  -SKTC 30sec x3ea -LTR x10ea -supine marching with TrA 2x20 -Hooklying oblique reaches 2x10ea -DKTC with LES on physioball x109min -Seated clam with GTB x30 -Seated adductor sqz 5 2x10 -Seated assisted partial sit up using pink ball 2x10 -Seated fwd flexion stretch 10 x5  -PNF chops with pink ball (cues for core engagement) 2x10ea -STS from elevated plinth 2x10      PATIENT EDUCATION:  Education details: exercise rationale, progression/modification 11/10/23 reassessment findings, POC Person educated: Patient Education method: Explanation, Demonstration, Education comprehension: verbalized understanding, returned demonstration, verbal cues required, and tactile cues required  HOME EXERCISE PROGRAM: Access Code: [facility] URL: [email or web address]   ASSESSMENT:  CLINICAL IMPRESSION:  Pt has attended 26 visits of PT thus far. Pt has met 2/2 STG and 2/5 LTG. 5xSTS test decreased significantly since last tested. Pt has nearly met strength goal with slight strength deficit measured in L hip flexion. Discussed  importance of regular HEP performance with pt with verbalized understanding. Pt will benefit from additional PT x4weeks to work on improved functional strength and progress independent program.   Patient has met 2/2 short term goals and 2/5 long term goals with ability to complete HEP and improvement in symptoms, ROM, activity tolerance. Remaining goals not met due to continued deficits in strength, activity tolerance, posture, and functional mobility. Patient has made good progress toward remaining goals with improving strength and functional mobility. She continues to feel benefit from PT. Extending POC for 4 more weeks to work toward remaining  goals. Patient will continue to benefit from skilled physical therapy in order to improve function and reduce impairment.  7:57 AM, 12/19/23 Prentice CANDIE Stains PT, DPT Physical Therapist at [facility]    OBJECTIVE IMPAIRMENTS: Abnormal gait, decreased activity tolerance, decreased balance, decreased endurance, decreased mobility, difficulty walking, decreased ROM, decreased strength, increased muscle spasms, impaired flexibility, impaired sensation, improper body mechanics, postural dysfunction, and pain.   ACTIVITY LIMITATIONS: carrying, lifting, bending, sitting, standing, squatting, stairs, transfers, reach over head, hygiene/grooming, locomotion level, and caring for others  PARTICIPATION LIMITATIONS: meal prep, cleaning, laundry, shopping, community activity, and yard work  PERSONAL FACTORS: Fitness, Time since onset of injury/illness/exacerbation, and 3+ comorbidities: Chronic LBP, hx Lumbar surgery, CKD, hx breast cancer, DM, neuropathy, osteoporosis  are also affecting patient's functional outcome.   REHAB POTENTIAL: Good  CLINICAL DECISION MAKING: Evolving/moderate complexity  EVALUATION COMPLEXITY: Moderate   GOALS: Goals reviewed with patient? Yes  SHORT TERM GOALS: Target date: 08/11/2023    Patient will be independent with HEP in  order to improve functional outcomes. Baseline:  Goal status: MET 5/20  2.  Patient will report at least 25% improvement in symptoms for improved quality of life. Baseline:  Goal status: MET 08/11/23   LONG TERM GOALS: Target date: 09/08/2023    Patient will report at least 75% improvement in symptoms for improved quality of life. Baseline:  Goal status: IN PROGRESS (50% 9/4)  2.  Patient will improve modified oswestry score by at least 8 points in order to indicate improved tolerance to activity. Baseline:  Goal status: MET (improved by 6 points 5/20)  3.  Patient will demonstrate at least 25% improvement in lumbar ROM in all restricted planes for improved ability to move trunk while completing chores and cooking. Baseline:  Goal status: MET 5/20  4.  Patient will be able to complete 5x STS in under 15 seconds in order to demonstrate improved functional mobility/strength Baseline: see above Goal status: In progress - 9/4  5.  Patient will demonstrate grade of 5/5 MMT grade in all tested musculature as evidence of improved strength to assist with stair ambulation and gait.   Baseline:  Goal status: IN PROGRESS 9/4     PLAN:  PT FREQUENCY: 2x/week  PT DURATION: 4 weeks  PLANNED INTERVENTIONS: [id number]- PT Re-evaluation, 97110-Therapeutic exercises, 97530- Therapeutic activity, 97112- Neuromuscular re-education, 97535- Self Care, [id number]- Manual therapy, [phone]- Gait training, [phone]- Orthotic Fit/training, [phone]- Canalith repositioning, [id number]- Aquatic Therapy, [phone]- Splinting, Patient/Family education, Balance training, Stair training, Taping, Dry Needling, Joint mobilization, Joint manipulation, Spinal manipulation, Spinal mobilization, Scar mobilization, and DME instructions.  PLAN FOR NEXT SESSION: f/u with HEP, spinal mobility, core and glute and LE strength, functional strength    Asberry FORBES Rodes, PTA 12/15/2023, 10:36 AM  [facility] [address] [place], [place], [address] Phone: [phone]   Fax:  [phone]    Referring diagnosis? M54.50 (ICD-10-CM) - Low back pain, unspecified back pain laterality, unspecified chronicity, unspecified whether sciatica present Treatment diagnosis? (if different than referring diagnosis) m54.59 What was this (referring dx) caused by? []  Surgery []  Fall [x]  Ongoing issue []  Arthritis []  Other: ____________  Laterality: []  Rt []  Lt [x]  Both  Check all possible CPT codes:  *CHOOSE 10 OR LESS*    See Planned Interventions listed in the Plan section of the Evaluation.

## 2023-12-15 ENCOUNTER — Ambulatory Visit: Admitting: Nurse Practitioner

## 2023-12-15 ENCOUNTER — Encounter: Payer: Self-pay | Admitting: Nurse Practitioner

## 2023-12-15 VITALS — BP 130/70 | HR 62 | Ht 64.0 in | Wt 191.2 lb

## 2023-12-15 DIAGNOSIS — I10 Essential (primary) hypertension: Secondary | ICD-10-CM | POA: Diagnosis not present

## 2023-12-15 DIAGNOSIS — E559 Vitamin D deficiency, unspecified: Secondary | ICD-10-CM | POA: Diagnosis not present

## 2023-12-15 DIAGNOSIS — E039 Hypothyroidism, unspecified: Secondary | ICD-10-CM

## 2023-12-15 DIAGNOSIS — E890 Postprocedural hypothyroidism: Secondary | ICD-10-CM | POA: Diagnosis not present

## 2023-12-15 DIAGNOSIS — Z7984 Long term (current) use of oral hypoglycemic drugs: Secondary | ICD-10-CM

## 2023-12-15 DIAGNOSIS — E1159 Type 2 diabetes mellitus with other circulatory complications: Secondary | ICD-10-CM | POA: Diagnosis not present

## 2023-12-15 DIAGNOSIS — E042 Nontoxic multinodular goiter: Secondary | ICD-10-CM

## 2023-12-15 DIAGNOSIS — Z794 Long term (current) use of insulin: Secondary | ICD-10-CM | POA: Diagnosis not present

## 2023-12-15 DIAGNOSIS — Z789 Other specified health status: Secondary | ICD-10-CM | POA: Diagnosis not present

## 2023-12-15 LAB — POCT GLYCOSYLATED HEMOGLOBIN (HGB A1C): Hemoglobin A1C: 7.9 % — AB (ref 4.0–5.6)

## 2023-12-15 NOTE — Progress Notes (Signed)
 12/15/2023, 11:35 AM   Endocrinology follow-up note   Subjective:    Patient ID: [name], female    DOB: 1950/06/09.  [name] is being seen in follow-up for management of currently uncontrolled,complicated symptomatic type 2 diabetes and associated hypertension, hypothyroidism. PMD :  Mahlon Comer BRAVO, MD.   Past Medical History:  Diagnosis Date   Anginal pain (HCC) 03/19/2012   saw Dr. Calhoun .. she thinks its more related to stomach issues   Arthritis    all over; mostly in my back (03/20/2012)   Breast cancer (HCC)    right (03/20/2012)   Cardiomyopathy    Chest pain 06/2005   Hospitalized, dystolic dysfunction   Chronic lower back pain    have 2 herniated discs; going to have to have a fusion (03/20/2012)   CKD (chronic kidney disease), stage III (HCC)    Family history of anesthesia complication    father has nausea and vomiting   Gallstones    GERD (gastroesophageal reflux disease)    H/O hiatal hernia    History of kidney stones    History of right mastectomy    Hypertension    patient states never had HTN, takes Hyzaar for heart   Hypothyroidism    Irritable bowel syndrome    Migraines    it's been a long time since I've had one (03/20/2012)   Osteoarthritis    Peripheral neuropathy    PONV (postoperative nausea and vomiting)    Sciatica    Sinus bradycardia    Sleep apnea    mild, no CPAP   Type II diabetes mellitus (HCC)    Past Surgical History:  Procedure Laterality Date   ABDOMINAL HYSTERECTOMY  1993   adenosin cardiolite   07/2005   (+) wall motion abnormality   AXILLARY LYMPH NODE DISSECTION  2003   squamous cell cancer   BACK SURGERY     CARDIAC CATHETERIZATION  ? 2005 / 2007   CHOLECYSTECTOMY N/A 01/24/2018   Procedure: LAPAROSCOPIC CHOLECYSTECTOMY;  Surgeon: Vanderbilt Ned, MD;  Location: [facility] OR;  Service: General;  Laterality: N/A;    CT abdomen and pelvis  02/2010   Same   ESOPHAGOGASTRODUODENOSCOPY  2005   GERD   KNEE ARTHROSCOPY  1990's   right (03/20/2012)   LUMBAR FUSION N/A 08/22/2014   Procedure: Right L2-3 and right L1-2 transforaminal lumbar interbody fusion with pedicle screws, rods, sleeves, cages, local bone graft, Vivigen, cancellous chips;  Surgeon: Lynwood BRAVO Better, MD;  Location: [facility] OR;  Service: Orthopedics;  Laterality: N/A;   LUMBAR LAMINECTOMY/DECOMPRESSION MICRODISCECTOMY N/A 02/04/2014   Procedure: RIGHT L2-3 MICRODISCECTOMY ;  Surgeon: Lynwood BRAVO Better, MD;  Location: [facility] OR;  Service: Orthopedics;  Laterality: N/A;   MASTECTOMY WITH AXILLARY LYMPH NODE DISSECTION  12/2003   right (03/20/2012)   PARATHYROIDECTOMY Left 10/11/2022   Procedure: MINIMALLY INVASIVE LEFT INFERIOR PARATHYROIDECTOMY;  Surgeon: Rubin Calamity, MD;  Location: [facility];  Service: General;  Laterality: Left;   POSTERIOR CERVICAL FUSION/FORAMINOTOMY  2001   SHOULDER ARTHROSCOPY W/ ROTATOR CUFF REPAIR  2003   Left   THYROIDECTOMY  1977   Right   TOTAL KNEE ARTHROPLASTY  2000   Right   US  of abdomen  07/2005   Fatty liver / kidney stones   Social History   Socioeconomic History   Marital status: Married    Spouse name: Not on file   Number of children: 1   Years of education: Not on file   Highest education level: Not on file  Occupational History   Occupation: Retired since 2003  Tobacco Use   Smoking status: Never   Smokeless tobacco: Never  Vaping Use   Vaping status: Never Used  Substance and Sexual Activity   Alcohol use: No   Drug use: No   Sexual activity: Not Currently  Other Topics Concern   Not on file  Social History Narrative   Not on file   Social Drivers of Health   Financial Resource Strain: Medium Risk (01/05/2023)   Overall Financial Resource Strain (CARDIA)    Difficulty of Paying Living Expenses: Somewhat hard  Food Insecurity: No Food Insecurity (01/05/2023)   Hunger Vital Sign     Worried About Running Out of Food in the Last Year: Never true    Ran Out of Food in the Last Year: Never true  Transportation Needs: No Transportation Needs (01/05/2023)   PRAPARE - Administrator, Civil Service (Medical): No    Lack of Transportation (Non-Medical): No  Physical Activity: Inactive (01/05/2023)   Exercise Vital Sign    Days of Exercise per Week: 0 days    Minutes of Exercise per Session: 0 min  Stress: No Stress Concern Present (01/05/2023)   [employer] of Occupational Health - Occupational Stress Questionnaire    Feeling of Stress : Not at all  Social Connections: Moderately Integrated (01/05/2023)   Social Connection and Isolation Panel    Frequency of Communication with Friends and Family: More than three times a week    Frequency of Social Gatherings with Friends and Family: Three times a week    Attends Religious Services: More than 4 times per year    Active Member of Clubs or Organizations: No    Attends Banker Meetings: Never    Marital Status: Married   Outpatient Encounter Medications as of 12/15/2023  Medication Sig   acetaminophen  (TYLENOL ) 500 MG tablet Take 1,000 mg by mouth every 6 (six) hours as needed for moderate pain, headache or mild pain.   Alcohol Swabs (B-D SINGLE USE SWABS REGULAR) PADS Use as directed.   alendronate  (FOSAMAX ) 70 MG tablet Take 1 tablet (70 mg total) by mouth every 7 (seven) days. Take with a full glass of water  on an empty stomach.   aspirin  81 MG tablet Take 81 mg by mouth daily.   azelastine  (ASTELIN ) 0.1 % nasal spray Place 1 spray into both nostrils 2 (two) times daily. Use in each nostril as directed   Blood Glucose Monitoring Suppl (TRUE METRIX AIR GLUCOSE METER) w/Device KIT 1 kit by Does not apply route 3 (three) times daily.   cetirizine  (ZYRTEC ) 10 MG tablet TAKE 1 TABLET BY MOUTH EVERY DAY   Cholecalciferol  (VITAMIN D ) 50 MCG (2000 UT) tablet Take 2,000 Units by mouth daily.   COMIRNATY  syringe    cyanocobalamin  2000 MCG tablet Take 2,000 mcg by mouth daily.   dapagliflozin  propanediol (FARXIGA ) 10 MG TABS tablet Take 10 mg by mouth daily. (Patient taking differently: Take 10 mg by mouth daily.)   famotidine  (PEPCID ) 40 MG tablet TAKE 1 TABLET BY MOUTH EVERY DAY   fluticasone  (FLONASE ) 50  MCG/ACT nasal spray Place 2 sprays into both nostrils daily.   gabapentin  (NEURONTIN ) 100 MG capsule Take 1 capsule (100 mg total) by mouth 3 (three) times daily.   glucose blood (TRUE METRIX BLOOD GLUCOSE TEST) test strip Use as instructed   hydrALAZINE  (APRESOLINE ) 50 MG tablet Take 1 tablet (50 mg total) by mouth 3 (three) times daily.   insulin  aspart protamine - aspart (NOVOLOG  MIX 70/30 FLEXPEN) (70-30) 100 UNIT/ML FlexPen Inject 25 Units into the skin 2 (two) times daily with a meal.   levothyroxine  (SYNTHROID ) 100 MCG tablet Take 1 tablet (100 mcg total) by mouth daily.   losartan  (COZAAR ) 25 MG tablet Take 1 tablet (25 mg total) by mouth daily.   metoCLOPramide  (REGLAN ) 10 MG tablet TAKE 1 TABLET FOUR TIMES DAILY   Omega-3 Fatty Acids (OMEGA 3 PO) Take 2 capsules by mouth daily. Omega XL supplement   pantoprazole  (PROTONIX ) 40 MG tablet Take 1 tablet (40 mg total) by mouth 2 (two) times daily.   Polyethyl Glycol-Propyl Glycol (SYSTANE ULTRA) 0.4-0.3 % SOLN Apply 1-2 drops to eye daily as needed (dry eyes).   polyethylene glycol (MIRALAX  / GLYCOLAX ) packet Take 17 g by mouth daily as needed for moderate constipation (MIX AND DRINK).   rosuvastatin  (CRESTOR ) 20 MG tablet TAKE 1 TABLET AT BEDTIME   SUMAtriptan  (IMITREX ) 50 MG tablet TAKE 1 TAB EVERY 2 HRS AS NEEDED FOR MIGRAINE. MAY REPEAT IN 2 HOURS IF HEADACHE PERSISTS OR RECURS. (Patient taking differently: Take 50 mg by mouth every 2 (two) hours as needed for migraine (May repeat in 2 hours if headache persists or recurs.).)   torsemide  (DEMADEX ) 20 MG tablet Take 1 tablet (20 mg total) by mouth 2 (two) times daily.   traMADol  (ULTRAM )  50 MG tablet Take 1 tablet (50 mg total) by mouth every 6 (six) hours as needed (mild pain).   TRUEplus Lancets 33G MISC Use as directed   [DISCONTINUED] levothyroxine  (SYNTHROID ) 100 MCG tablet Take 1 tablet (100 mcg total) by mouth daily.   No facility-administered encounter medications on file as of 12/15/2023.    ALLERGIES: Allergies  Allergen Reactions   Dilaudid  [Hydromorphone  Hcl] Nausea And Vomiting   Lipitor [Atorvastatin ] Other (See Comments)    Body & Muscle Aches   Adhesive [Tape] Rash   Ampicillin Rash and Other (See Comments)    Also developed welts on her hands when she took it in the hospital   Latex Itching, Dermatitis, Rash and Other (See Comments)    Patient had a reaction to tape after surgery and they told her she was allergic to Latex    Penicillins Rash         VACCINATION STATUS: Immunization History  Administered Date(s) Administered   Fluad Quad(high Dose 65+) 12/13/2019, 02/01/2021, 01/06/2022   INFLUENZA, HIGH DOSE SEASONAL PF 01/10/2017, 01/02/2023   Influenza Split 01/31/2011   Influenza Whole 02/23/2007   Influenza,inj,Quad PF,6+ Mos 01/07/2014, 02/09/2016, 01/01/2018, 12/03/2018   Influenza-Unspecified 01/10/2012   PFIZER Comirnaty(Gray Top)Covid-19 Tri-Sucrose Vaccine 05/24/2019, 06/18/2019, 01/11/2020, 08/15/2020   PFIZER(Purple Top)SARS-COV-2 Vaccination 05/24/2019, 06/18/2019, 01/11/2020   Pfizer(Comirnaty)Fall Seasonal Vaccine 12 years and older 01/02/2023   Pneumococcal Conjugate-13 05/16/2019   Pneumococcal Polysaccharide-23 08/25/2014   Tdap 04/20/2011, 07/20/2022    Diabetes She presents for her follow-up diabetic visit. She has type 2 diabetes mellitus. Onset time: She was diagnosed at approximate age of 38 years. Her disease course has been fluctuating. There are no hypoglycemic associated symptoms. Pertinent negatives for hypoglycemia include no confusion,  pallor, seizures or tremors. Associated symptoms include fatigue, polydipsia  and polyuria. Pertinent negatives for diabetes include no foot ulcerations, no polyphagia and no weight loss. Pertinent negatives for hypoglycemia complications include no nocturnal hypoglycemia. Symptoms are improving. Diabetic complications include heart disease and nephropathy. Risk factors for coronary artery disease include diabetes mellitus, hypertension, obesity, sedentary lifestyle, post-menopausal and dyslipidemia. Current diabetic treatment includes insulin  injections and oral agent (monotherapy). She is compliant with treatment most of the time. Her weight is stable. She is following a generally unhealthy diet. When asked about meal planning, she reported none. She has not had a previous visit with a dietitian. She never participates in exercise. Her home blood glucose trend is decreasing steadily. Her breakfast blood glucose range is generally 140-180 mg/dl. Her lunch blood glucose range is generally 140-180 mg/dl. Her dinner blood glucose range is generally 140-180 mg/dl. Her bedtime blood glucose range is generally 140-180 mg/dl. Her overall blood glucose range is 140-180 mg/dl. (She presents today with her CGM showing mostly at target glycemic profile.  Her POCT A1c today is 7.9%, improving from last visit of 8.7%.  Analysis of her CGM shows TIR 78%, TAR 20%, TBR 2% with a GMI of 6.8%.  She has been having nocturnal hypoglycemia. She reports she is only able to tolerate eating once a day.) An ACE inhibitor/angiotensin II receptor blocker is being taken. She does not see a podiatrist.Eye exam is current.  Thyroid  Problem Presents for follow-up visit. Symptoms include depressed mood and fatigue. Patient reports no cold intolerance, diarrhea, heat intolerance, tremors, weight gain or weight loss. The symptoms have been stable.     Review of systems  Constitutional: + steadily increasing body weight,  current Body mass index is 32.82 kg/m. , no fatigue, no subjective hyperthermia, no subjective  hypothermia Eyes: no blurry vision, no xerophthalmia ENT: no sore throat, no nodules palpated in throat, no dysphagia/odynophagia, no hoarseness Cardiovascular: no chest pain, no shortness of breath, no palpitations Respiratory: no cough, no shortness of breath Gastrointestinal: no nausea/vomiting/diarrhea Musculoskeletal: c/o chronic back pain- has spinal stimulator placed Skin: no rashes, no hyperemia Neurological: no tremors, + numbness/tingling, cold sensation to BLE, no dizziness Psychiatric: no depression, no anxiety    Objective:    BP 130/70 (BP Location: Left Arm, Patient Position: Sitting, Cuff Size: Large)   Pulse 62   Ht 5' 4 (1.626 m)   Wt 191 lb 3.2 oz (86.7 kg)   BMI 32.82 kg/m   Wt Readings from Last 3 Encounters:  12/15/23 191 lb 3.2 oz (86.7 kg)  12/14/23 191 lb 12.8 oz (87 kg)  08/14/23 196 lb (88.9 kg)    BP Readings from Last 3 Encounters:  12/15/23 130/70  12/14/23 122/70  08/14/23 134/80     Physical Exam- Limited  Constitutional:  Body mass index is 32.82 kg/m. , not in acute distress, normal state of mind Eyes:  EOMI, no exophthalmos Musculoskeletal: no gross deformities, strength intact in all four extremities, no gross restriction of joint movements Skin:  no rashes, no hyperemia Neurological: no tremor with outstretched hands  Diabetic Foot Exam - Simple   No data filed         Latest Ref Rng & Units 07/17/2023    3:07 PM 02/06/2023    2:45 PM 10/12/2022    2:11 AM  CMP  Glucose 70 - 99 mg/dL  831  732   BUN 8 - 27 mg/dL  26  27   Creatinine 9.42 - 1.00 mg/dL  1.64  1.83   Sodium 134 - 144 mmol/L  141  135   Potassium 3.5 - 5.2 mmol/L  4.8  5.2   Chloride 96 - 106 mmol/L  105  106   CO2 20 - 29 mmol/L  18  20   Calcium  8.7 - 10.3 mg/dL  8.8  9.7   Total Protein 6.0 - 8.3 g/dL 7.1  7.2    Total Bilirubin 0.2 - 1.2 mg/dL 0.5  0.4    Alkaline Phos 39 - 117 U/L 53  56    AST 0 - 37 U/L 21  22    ALT 0 - 35 U/L 9  7        Diabetic Labs (most recent): Lab Results  Component Value Date   HGBA1C 7.9 (A) 12/15/2023   HGBA1C 8.7 (A) 08/14/2023   HGBA1C 10.1 (A) 05/12/2023   MICROALBUR 570.4 08/05/2019   MICROALBUR 188.7 06/01/2018   MICROALBUR 38.3 06/30/2016     Lipid Panel ( most recent) Lipid Panel     Component Value Date/Time   CHOL 141 07/17/2023 1507   CHOL 162 02/05/2021 0947   TRIG 241.0 (H) 07/17/2023 1507   HDL 41.40 07/17/2023 1507   HDL 51 02/05/2021 0947   CHOLHDL 3 07/17/2023 1507   VLDL 48.2 (H) 07/17/2023 1507   LDLCALC 52 07/17/2023 1507   LDLCALC 76 02/05/2021 0947   LDLCALC 60 06/02/2020 0816   LDLDIRECT 58.0 06/14/2022 1122      Assessment & Plan:   1) DM type 2 causing vascular disease (HCC)  - [name] has currently uncontrolled symptomatic type 2 DM since 72 years of age.  She presents today with her CGM showing mostly at target glycemic profile.  Her POCT A1c today is 7.9%, improving from last visit of 8.7%.  Analysis of her CGM shows TIR 78%, TAR 20%, TBR 2% with a GMI of 6.8%.  She has been having nocturnal hypoglycemia. She reports she is only able to tolerate eating once a day.  -her diabetes is complicated by retinopathy, obesity/sedentary life, and she remains at a high risk for more acute and chronic complications which include CAD, CVA, CKD, retinopathy, and neuropathy. These are all discussed in detail with her.  The following Lifestyle Medicine recommendations according to American College of Lifestyle Medicine [facility]) were discussed and offered to patient and she agrees to start the journey:  A. Whole Foods, Plant-based plate comprising of fruits and vegetables, plant-based proteins, whole-grain carbohydrates was discussed in detail with the patient.   A list for source of those nutrients were also provided to the patient.  Patient will use only water  or unsweetened tea for hydration. B.  The need to stay away from risky substances including  alcohol, smoking; obtaining 7 to 9 hours of restorative sleep, at least 150 minutes of moderate intensity exercise weekly, the importance of healthy social connections,  and stress reduction techniques were discussed. C.  A full color page of  Calorie density of various food groups per pound showing examples of each food groups was provided to the patient.  - Nutritional counseling repeated at each appointment due to patients tendency to fall back in to old habits.  - The patient admits there is a room for improvement in their diet and drink choices. -  Suggestion is made for the patient to avoid simple carbohydrates from their diet including Cakes, Sweet Desserts / Pastries, Ice Cream, Soda (diet and regular), Sweet Tea, Candies, Chips, Cookies,  Sweet Pastries, Store Bought Juices, Alcohol in Excess of 1-2 drinks a day, Artificial Sweeteners, Coffee Creamer, and Sugar-free Products. This will help patient to have stable blood glucose profile and potentially avoid unintended weight gain.   - I encouraged the patient to switch to unprocessed or minimally processed complex starch and increased protein intake (animal or plant source), fruits, and vegetables.   - Patient is advised to stick to a routine mealtimes to eat 3 meals a day and avoid unnecessary snacks (to snack only to correct hypoglycemia).  - she is following with Penny Crumpton, RDN, CDE for individualized diabetes education.  - I have approached her with the following individualized plan to manage diabetes and patient agrees:   -She will continue to benefit from her current simplified insulin  treatment regimen.    -She is encouraged to stay engaged for proper monitoring and safe use of insulin  (which she has struggled with in the past).  -She is advised to continue Novolin  70/30 25 units in the morning with her meal and to skip the evening dose given her recent nocturnal hypoglycemia.  She can continue her Farxiga  10 mg po daily as  prescribed by nephrology- once approved by PAP.   -She is encouraged to start monitoring blood glucose consistently 4 times per day (using her CGM), before meals and before bed and to call the clinic if she has readings less than 70 or greater than 200 for 3 tests in a row.  - she is warned not to take insulin  without proper monitoring per orders.  -She is not a candidate for Metformin  due to CKD.    - Patient specific target  A1c;  LDL, HDL, Triglycerides, were discussed in detail.  2) BP/HTN:  Her blood pressure is controlled to target for her age.  She is advised to continue her medication regimen as prescribed by PCP and nephrology.    3) Lipids/HPL:    Her most recent lipid panel from 07/17/23 shows controlled LDL of 52 and elevated triglycerides of 241.  She is advised to continue Crestor  20 mg po daily.    4)  Weight/Diet:  Her Body mass index is 32.82 kg/m.- clearly complicating her diabetes care.  She is a candidate for modest weight loss.  I discussed with her the fact that loss of 5 - 10% of her  current body weight will have the most impact on her diabetes management.  CDE Consult will be initiated . Exercise, and detailed carbohydrates information provided  -  detailed on discharge instructions.  5) Hypothyroidism-postsurgical There are no recent TFTs to review.  She is advised to continue Levothyroxine  100 mcg po daily before breakfast.     - We discussed about the correct intake of her thyroid  hormone, on empty stomach at fasting, with water , separated by at least 30 minutes from breakfast and other medications,  and separated by more than 4 hours from calcium , iron , multivitamins, acid reflux medications (PPIs). -Patient is made aware of the fact that thyroid  hormone replacement is needed for life, dose to be adjusted by periodic monitoring of thyroid  function tests.  6) Multinodular goiter. -Her most recent thyroid /neck ultrasound from 09/12/18 revealed 2.9 cm nodule on the  left lobe of her thyroid  -She has surgically absent right thyroid  lobe.  Biopsy of 2.9 cm nodule on the left lobe is benign.    She will not need any surgical intervention at this time.    7) Vitamin D  deficiency -Her most recent vitamin D   level on 06/02/20 was 15.  She was taken off her vitamin D  supplement by her nephrologist. Will recommend she follow up with her nephrologist regarding low vitamin D  levels.  8) Chronic Care/Health Maintenance: -she is on ARB and is encouraged to initiate and continue to follow up with Ophthalmology, Dentist,  Podiatrist at least yearly or according to recommendations, and advised to  stay away from smoking. I have recommended yearly flu vaccine and pneumonia vaccine at least every 5 years; moderate intensity exercise for up to 150 minutes weekly; and  sleep for at least 7 hours a day.  - I advised patient to maintain close follow up with Tabori, Katherine E, MD for primary care needs.     I spent  35  minutes in the care of the patient today including review of labs from CMP, Lipids, Thyroid  Function, Hematology (current and previous including abstractions from other facilities); face-to-face time discussing  her blood glucose readings/logs, discussing hypoglycemia and hyperglycemia episodes and symptoms, medications doses, her options of short and long term treatment based on the latest standards of care / guidelines;  discussion about incorporating lifestyle medicine;  and documenting the encounter. Risk reduction counseling performed per USPSTF guidelines to reduce obesity and cardiovascular risk factors.     Please refer to Patient Instructions for Blood Glucose Monitoring and Insulin /Medications Dosing Guide  in media tab for additional information. Please  also refer to  Patient Self Inventory in the Media  tab for reviewed elements of pertinent patient history.  [name] participated in the discussions, expressed understanding, and voiced  agreement with the above plans.  All questions were answered to her satisfaction. she is encouraged to contact clinic should she have any questions or concerns prior to her return visit.   Follow up plan: - Return in about 4 months (around 04/15/2024) for Diabetes F/U with A1c in office, No previsit labs, Bring meter and logs.  Benton Rio, [facility] [address] [place], [place] [address] Phone: [phone] Fax: [phone]  12/15/2023, 11:35 AM

## 2023-12-19 ENCOUNTER — Encounter (HOSPITAL_BASED_OUTPATIENT_CLINIC_OR_DEPARTMENT_OTHER): Payer: Self-pay | Admitting: Physical Therapy

## 2023-12-19 ENCOUNTER — Ambulatory Visit (HOSPITAL_BASED_OUTPATIENT_CLINIC_OR_DEPARTMENT_OTHER): Admitting: Physical Therapy

## 2023-12-19 DIAGNOSIS — R2689 Other abnormalities of gait and mobility: Secondary | ICD-10-CM

## 2023-12-19 DIAGNOSIS — M6281 Muscle weakness (generalized): Secondary | ICD-10-CM

## 2023-12-19 DIAGNOSIS — M5459 Other low back pain: Secondary | ICD-10-CM | POA: Diagnosis not present

## 2023-12-19 NOTE — Addendum Note (Signed)
 Addended by: Ellenore Roscoe S on: 12/19/2023 07:58 AM   Modules accepted: Orders

## 2023-12-19 NOTE — Therapy (Signed)
 OUTPATIENT PHYSICAL THERAPY THORACOLUMBAR NOTE Progress Note Reporting Period 10/17/2023 to 12/14/2023  See note below for Objective Data and Assessment of Progress/Goals.          Patient Name: [name] MRN: [id number] DOB:1950-05-07, 73 y.o., female Today's Date: 12/19/2023     END OF SESSION:  PT End of Session - 12/19/23 1150     Visit Number 27    Number of Visits 48    Date for PT Re-Evaluation 01/11/24    Authorization Type [employer]    Authorization Time Period 18 visits approved from 11/13/2023-01/13/2024    Authorization - Number of Visits 18    Progress Note Due on Visit 32    PT Start Time 1149    PT Stop Time 1228    PT Time Calculation (min) 39 min    Activity Tolerance Patient tolerated treatment well    Behavior During Therapy [facility] for tasks assessed/performed                        Past Medical History:  Diagnosis Date   Anginal pain (HCC) 03/19/2012   saw Dr. Calhoun .. she thinks its more related to stomach issues   Arthritis    all over; mostly in my back (03/20/2012)   Breast cancer (HCC)    right (03/20/2012)   Cardiomyopathy    Chest pain 06/2005   Hospitalized, dystolic dysfunction   Chronic lower back pain    have 2 herniated discs; going to have to have a fusion (03/20/2012)   CKD (chronic kidney disease), stage III (HCC)    Family history of anesthesia complication    father has nausea and vomiting   Gallstones    GERD (gastroesophageal reflux disease)    H/O hiatal hernia    History of kidney stones    History of right mastectomy    Hypertension    patient states never had HTN, takes Hyzaar for heart   Hypothyroidism    Irritable bowel syndrome    Migraines    it's been a long time since I've had one (03/20/2012)   Osteoarthritis    Peripheral neuropathy    PONV (postoperative nausea and vomiting)    Sciatica    Sinus bradycardia    Sleep apnea    mild, no CPAP   Type II diabetes  mellitus (HCC)    Past Surgical History:  Procedure Laterality Date   ABDOMINAL HYSTERECTOMY  1993   adenosin cardiolite   07/2005   (+) wall motion abnormality   AXILLARY LYMPH NODE DISSECTION  2003   squamous cell cancer   BACK SURGERY     CARDIAC CATHETERIZATION  ? 2005 / 2007   CHOLECYSTECTOMY N/A 01/24/2018   Procedure: LAPAROSCOPIC CHOLECYSTECTOMY;  Surgeon: Vanderbilt Ned, MD;  Location: [facility] OR;  Service: General;  Laterality: N/A;   CT abdomen and pelvis  02/2010   Same   ESOPHAGOGASTRODUODENOSCOPY  2005   GERD   KNEE ARTHROSCOPY  1990's   right (03/20/2012)   LUMBAR FUSION N/A 08/22/2014   Procedure: Right L2-3 and right L1-2 transforaminal lumbar interbody fusion with pedicle screws, rods, sleeves, cages, local bone graft, Vivigen, cancellous chips;  Surgeon: Lynwood FORBES Better, MD;  Location: [facility] OR;  Service: Orthopedics;  Laterality: N/A;   LUMBAR LAMINECTOMY/DECOMPRESSION MICRODISCECTOMY N/A 02/04/2014   Procedure: RIGHT L2-3 MICRODISCECTOMY ;  Surgeon: Lynwood FORBES Better, MD;  Location: [facility] OR;  Service: Orthopedics;  Laterality: N/A;   MASTECTOMY WITH AXILLARY  LYMPH NODE DISSECTION  12/2003   right (03/20/2012)   PARATHYROIDECTOMY Left 10/11/2022   Procedure: MINIMALLY INVASIVE LEFT INFERIOR PARATHYROIDECTOMY;  Surgeon: Rubin Calamity, MD;  Location: [facility] OR;  Service: General;  Laterality: Left;   POSTERIOR CERVICAL FUSION/FORAMINOTOMY  2001   SHOULDER ARTHROSCOPY W/ ROTATOR CUFF REPAIR  2003   Left   THYROIDECTOMY  1977   Right   TOTAL KNEE ARTHROPLASTY  2000   Right   US  of abdomen  07/2005   Fatty liver / kidney stones   Patient Active Problem List   Diagnosis Date Noted   Class 2 obesity 12/14/2023   Hyperglycemia due to type 2 diabetes mellitus (HCC) 12/14/2023   Urinary incontinence 08/09/2023   Metallic taste 05/11/2023   S/P parathyroidectomy 10/11/2022   Allergic rhinitis 07/25/2022   Syncope 07/21/2022   Sinus bradycardia 07/21/2022   Hammer toe of  second toe of left foot 05/05/2021   Corns and callosities 05/05/2021   Hyperlipidemia associated with type 2 diabetes mellitus (HCC) 02/01/2021   Depression 02/01/2021   Localized osteoporosis without current pathological fracture 05/01/2020   Hyperparathyroidism (HCC) 12/13/2019   Elevated hemoglobin (HCC) 12/13/2019   Chronic renal impairment, stage 4 (severe) (HCC) 10/28/2019   Anemia 10/28/2019   Physical exam 06/26/2019   Neck pain 04/18/2019   Ascending aortic aneurysm (HCC) 04/17/2019   Nonspecific chest pain 04/15/2019   Neuropathic pain of right flank 12/27/2018   Dysphagia 12/27/2018   Postsurgical hypothyroidism 06/11/2018   Vitamin D  deficiency 06/11/2018   Multinodular goiter 06/11/2018   DM type 2 causing vascular disease (HCC)    Peripheral neuropathy    Migraine    Irritable bowel syndrome    Essential hypertension    History of right mastectomy    H/O hiatal hernia    GERD (gastroesophageal reflux disease)    Family history of anesthesia complication    Other chronic pain    Type 2 diabetes mellitus with diabetic chronic kidney disease (HCC)    Lower extremity edema    Chronic constipation 06/23/2015   Chronic diastolic heart failure (HCC) 11/17/2014   Herniated nucleus pulposus, L2-3 right 02/04/2014    Class: Acute   Spondylolisthesis of lumbar region 04/30/2012    Class: Chronic   Lumbar stenosis with neurogenic claudication 04/30/2012    Class: Chronic   SYNCOPE-CAROTID SINUS 09/16/2008   OSTEOARTHRITIS 08/23/2006   DDD (degenerative disc disease), cervical 08/23/2006   DISC DISEASE, LUMBAR 08/23/2006   Headache(784.0) 08/23/2006   BREAST CANCER, HX OF 08/23/2006   RENAL CALCULUS, HX OF 08/23/2006    PCP: Mahlon Comer BRAVO, MD   REFERRING PROVIDER: Georgina Ozell LABOR, MD  REFERRING DIAG: M54.50 (ICD-10-CM) - Low back pain, unspecified back pain laterality, unspecified chronicity, unspecified whether sciatica present  Rationale for Evaluation  and Treatment: Rehabilitation  THERAPY DIAG:  Muscle weakness (generalized)  Other abnormalities of gait and mobility  Other low back pain  ONSET DATE: chronic   SUBJECTIVE:  SUBJECTIVE STATEMENT: Pt reports balance is off but the exercises seem to help.    PERTINENT HISTORY:  Chronic LBP, hx Lumbar surgery, CKD, hx breast cancer, DM, neuropathy, osteoporosis, does have spinal cord stimulator  PAIN:  Are you having pain? Yes: NPRS scale: R hip 5/10; 8/10 Pain location: low back, abdomen  Pain description: achy Aggravating factors: walking/standing Relieving factors: laying down  PRECAUTIONS: None  WEIGHT BEARING RESTRICTIONS: No  FALLS:  Has patient fallen in last 6 months? No  PLOF: Independent  PATIENT GOALS: stand up and cook   OBJECTIVE: (objective measures from initial evaluation unless otherwise dated)  PATIENT SURVEYS: Modified Oswestry 30/50  5/20: 24/50 10/17/23: 18 / 50 7/24: 11/10/23 30/50 12/14/23: 24/50 (48%)  SCREENING FOR RED FLAGS: Bowel or bladder incontinence: No Spinal tumors: No Cauda equina syndrome: No Compression fracture: No Abdominal aneurysm: No  COGNITION: Overall cognitive status: Within functional limits for tasks assessed     SENSATION: WFL  POSTURE: rounded shoulders, forward head, decreased lumbar lordosis, increased thoracic kyphosis, and flexed trunk   PALPATION: TTP lumbar and thoracic paraspinals with greatest tenderness throughout lumbar spine.  LUMBAR ROM:   AROM eval 5/20 11/10/23 12/14/23  Flexion 25% limited Full 0% limited 0% limited   Extension 90% limited * 40% limited 40% limited 40% limited  Right lateral flexion 75% limited * 25% limited* 25% limited* 10%limited  Left lateral flexion 75% limited* 40% limited 25% limited 10%  limited  Right rotation 50% limited* 15% limited 0% limited 10% limited  Left rotation 50% limited* Full 0% limited 10% limited   (Blank rows = not tested) * = pain/symptoms    LOWER EXTREMITY MMT:  unable to test extension as patient unable to get into prone  MMT Right eval Left eval Right 5/20 Left 5/20 Right 10/17/23 Left 10/17/23 Right 11/10/23 Left 11/10/23 Right 12/14/23 Left 12/14/23  Hip flexion 4+ 4+ 4+ 4 4+ 4+ 5 4+ 5 4+  Hip extension            Hip abduction 4- 3+ 4+ (seated) 4-(seated) 4+ (seated) 4 + (seated) 4+ 4+ 5 5  Hip adduction            Hip internal rotation            Hip external rotation            Knee flexion 4+ 4+ 4+ 4 4+ 4+ 5 5 5 5   Knee extension 4+ 4+ 4+ 4 4+ 4+ 5 5 5 5   Ankle dorsiflexion 4+ 4+ 5 4+        Ankle plantarflexion            Ankle inversion            Ankle eversion             (Blank rows = not tested) * = pain/symptoms    FUNCTIONAL TESTS:  5 times sit to stand: 27.59 seconds with UE use 08/08/23:  22.5s with UE use 5/20: 20.03s without UE use (lowered plinth) 10/17/23: 24.58 seconds without UE use in chair 11/10/23: 23.89 seconds with intermittent UE use in chair - increased difficulty due to R hip pain 12/14/23: 40.81 with intermittent UE use in chair-L knee pain  GAIT: Distance walked: 100 feet Assistive device utilized: Single point cane Level of assistance: Modified independence Comments: labored, decreased foot clearance  TODAY'S TREATMENT:  DATE:  12/19/23 Seated trunk flexion stretch 5 x 10 second holds Standing hip abduction RTB at knees 2 x 10  Standing hip extension RTB at knees 2 x 10  Seated Row RTB 3 x 15 Seated shoulder extension RTB 3 x 15 Palof press 1 x 10  12/14/23 Nu step L3 x98min MODI Updated MMT and ROM 5xSTS Review of goals Sit to stands 2x10 (cues for  technique)    11/21/23 NuStep L5, UE/LE x 6 min Standing hip abduction RTB at knees 2 x 10  Standing hip extension RTB at knees 3 x 10  Seated trunk flexion stretch 5 x 10 second holds Lateral step up and over 2 x 10 Seated Row RTB 3 x 15 Seated shoulder extension RTB 2 x 20 Seated ab iso with green ball 2 x 10 with 5 second holds STS from elevated plinth 1x10  11/15/23 -NuStep L5, UE/LE x 5 min  -SKTC 30sec x3ea -LTR x20ea -manual HS and ITB stretching R LE -supine marching with TrA 2x20 -Hooklying oblique reaches 2x10ea -DKTC with LES on physioball x38min -Seated clam with GTB 2x30 -Seated adductor sqz 5 2x10 -Seated assisted partial sit up using pink ball 2x10 -Seated fwd flexion stretch 10 x5 to each side  -PNF chops with pink ball (cues for core engagement) 2x10ea -STS from elevated plinth 2x10   11/13/23 -NuStep L5, UE/LE x 5 min  -SKTC 30sec x3ea -LTR x10ea -supine marching with TrA 2x20 -Hooklying oblique reaches 2x10ea -DKTC with LES on physioball x72min -Seated clam with GTB x30 -Seated adductor sqz 5 2x10 -Seated assisted partial sit up using pink ball 2x10 -Seated fwd flexion stretch 10 x5  -PNF chops with pink ball (cues for core engagement) 2x10ea -STS from elevated plinth 2x10      PATIENT EDUCATION:  Education details: exercise rationale, progression/modification 11/10/23 reassessment findings, POC Person educated: Patient Education method: Explanation, Demonstration, Education comprehension: verbalized understanding, returned demonstration, verbal cues required, and tactile cues required  HOME EXERCISE PROGRAM: Access Code: [facility] URL: [email or web address]   ASSESSMENT:  CLINICAL IMPRESSION:  Continued with glute and core strengthening. Patient with increased LE fatigue today. Requiring more frequent rest breaks. Performed additional UE postural/core strengthening today. Patient will continue to benefit from physical therapy  in order to improve function and reduce impairment.     OBJECTIVE IMPAIRMENTS: Abnormal gait, decreased activity tolerance, decreased balance, decreased endurance, decreased mobility, difficulty walking, decreased ROM, decreased strength, increased muscle spasms, impaired flexibility, impaired sensation, improper body mechanics, postural dysfunction, and pain.   ACTIVITY LIMITATIONS: carrying, lifting, bending, sitting, standing, squatting, stairs, transfers, reach over head, hygiene/grooming, locomotion level, and caring for others  PARTICIPATION LIMITATIONS: meal prep, cleaning, laundry, shopping, community activity, and yard work  PERSONAL FACTORS: Fitness, Time since onset of injury/illness/exacerbation, and 3+ comorbidities: Chronic LBP, hx Lumbar surgery, CKD, hx breast cancer, DM, neuropathy, osteoporosis  are also affecting patient's functional outcome.   REHAB POTENTIAL: Good  CLINICAL DECISION MAKING: Evolving/moderate complexity  EVALUATION COMPLEXITY: Moderate   GOALS: Goals reviewed with patient? Yes  SHORT TERM GOALS: Target date: 08/11/2023    Patient will be independent with HEP in order to improve functional outcomes. Baseline:  Goal status: MET 5/20  2.  Patient will report at least 25% improvement in symptoms for improved quality of life. Baseline:  Goal status: MET 08/11/23   LONG TERM GOALS: Target date: 09/08/2023    Patient will report at least 75% improvement in symptoms for improved quality of life. Baseline:  Goal  status: IN PROGRESS (50% 9/4)  2.  Patient will improve modified oswestry score by at least 8 points in order to indicate improved tolerance to activity. Baseline:  Goal status: MET (improved by 6 points 5/20)  3.  Patient will demonstrate at least 25% improvement in lumbar ROM in all restricted planes for improved ability to move trunk while completing chores and cooking. Baseline:  Goal status: MET 5/20  4.  Patient will be able to  complete 5x STS in under 15 seconds in order to demonstrate improved functional mobility/strength Baseline: see above Goal status: In progress - 9/4  5.  Patient will demonstrate grade of 5/5 MMT grade in all tested musculature as evidence of improved strength to assist with stair ambulation and gait.   Baseline:  Goal status: IN PROGRESS 9/4     PLAN:  PT FREQUENCY: 2x/week  PT DURATION: 4 weeks  PLANNED INTERVENTIONS: 97164- PT Re-evaluation, 97110-Therapeutic exercises, 97530- Therapeutic activity, 97112- Neuromuscular re-education, 97535- Self Care, [id number]- Manual therapy, [phone]- Gait training, [phone]- Orthotic Fit/training, [phone]- Canalith repositioning, [id number]- Aquatic Therapy, [phone]- Splinting, Patient/Family education, Balance training, Stair training, Taping, Dry Needling, Joint mobilization, Joint manipulation, Spinal manipulation, Spinal mobilization, Scar mobilization, and DME instructions.  PLAN FOR NEXT SESSION: f/u with HEP, spinal mobility, core and glute and LE strength, functional strength    Prentice GORMAN Stains, PT 12/19/2023, 11:51 AM  [facility] [address] [place], [place], [address] Phone: [phone]   Fax:  [phone]    Referring diagnosis? M54.50 (ICD-10-CM) - Low back pain, unspecified back pain laterality, unspecified chronicity, unspecified whether sciatica present Treatment diagnosis? (if different than referring diagnosis) m54.59 What was this (referring dx) caused by? []  Surgery []  Fall [x]  Ongoing issue []  Arthritis []  Other: ____________  Laterality: []  Rt []  Lt [x]  Both  Check all possible CPT codes:  *CHOOSE 10 OR LESS*    See Planned Interventions listed in the Plan section of the Evaluation.

## 2023-12-21 ENCOUNTER — Encounter (HOSPITAL_BASED_OUTPATIENT_CLINIC_OR_DEPARTMENT_OTHER)

## 2023-12-26 ENCOUNTER — Ambulatory Visit (HOSPITAL_BASED_OUTPATIENT_CLINIC_OR_DEPARTMENT_OTHER)

## 2023-12-26 ENCOUNTER — Encounter (HOSPITAL_BASED_OUTPATIENT_CLINIC_OR_DEPARTMENT_OTHER): Payer: Self-pay

## 2023-12-26 DIAGNOSIS — M6281 Muscle weakness (generalized): Secondary | ICD-10-CM | POA: Diagnosis not present

## 2023-12-26 DIAGNOSIS — M5459 Other low back pain: Secondary | ICD-10-CM | POA: Diagnosis not present

## 2023-12-26 DIAGNOSIS — R2689 Other abnormalities of gait and mobility: Secondary | ICD-10-CM | POA: Diagnosis not present

## 2023-12-26 NOTE — Therapy (Signed)
 OUTPATIENT PHYSICAL THERAPY THORACOLUMBAR NOTE Progress Note Reporting Period 10/17/2023 to 12/14/2023  See note below for Objective Data and Assessment of Progress/Goals.          Patient Name: [name] MRN: [id number] DOB:[birth year], 73 y.o., female Today's Date: 12/26/2023     END OF SESSION:  PT End of Session - 12/26/23 1323     Visit Number 28    Number of Visits 48    Date for PT Re-Evaluation 01/11/24    Authorization Type [employer]    Authorization Time Period 18 visits approved from 11/13/2023-01/13/2024    Authorization - Visit Number 5    Authorization - Number of Visits 18    Progress Note Due on Visit 32    PT Start Time 1302    PT Stop Time 1350    PT Time Calculation (min) 48 min    Activity Tolerance Patient tolerated treatment well    Behavior During Therapy [facility] for tasks assessed/performed                         Past Medical History:  Diagnosis Date   Anginal pain (HCC) 03/19/2012   saw Dr. Calhoun .. she thinks its more related to stomach issues   Arthritis    all over; mostly in my back (03/20/2012)   Breast cancer (HCC)    right (03/20/2012)   Cardiomyopathy    Chest pain 06/2005   Hospitalized, dystolic dysfunction   Chronic lower back pain    have 2 herniated discs; going to have to have a fusion (03/20/2012)   CKD (chronic kidney disease), stage III (HCC)    Family history of anesthesia complication    father has nausea and vomiting   Gallstones    GERD (gastroesophageal reflux disease)    H/O hiatal hernia    History of kidney stones    History of right mastectomy    Hypertension    patient states never had HTN, takes Hyzaar for heart   Hypothyroidism    Irritable bowel syndrome    Migraines    it's been a long time since I've had one (03/20/2012)   Osteoarthritis    Peripheral neuropathy    PONV (postoperative nausea and vomiting)    Sciatica    Sinus bradycardia    Sleep apnea     mild, no CPAP   Type II diabetes mellitus (HCC)    Past Surgical History:  Procedure Laterality Date   ABDOMINAL HYSTERECTOMY  1993   adenosin cardiolite   07/2005   (+) wall motion abnormality   AXILLARY LYMPH NODE DISSECTION  2003   squamous cell cancer   BACK SURGERY     CARDIAC CATHETERIZATION  ? 2005 / 2007   CHOLECYSTECTOMY N/A 01/24/2018   Procedure: LAPAROSCOPIC CHOLECYSTECTOMY;  Surgeon: Vanderbilt Ned, MD;  Location: [facility] OR;  Service: General;  Laterality: N/A;   CT abdomen and pelvis  02/2010   Same   ESOPHAGOGASTRODUODENOSCOPY  2005   GERD   KNEE ARTHROSCOPY  1990's   right (03/20/2012)   LUMBAR FUSION N/A 08/22/2014   Procedure: Right L2-3 and right L1-2 transforaminal lumbar interbody fusion with pedicle screws, rods, sleeves, cages, local bone graft, Vivigen, cancellous chips;  Surgeon: Lynwood FORBES Better, MD;  Location: [facility] OR;  Service: Orthopedics;  Laterality: N/A;   LUMBAR LAMINECTOMY/DECOMPRESSION MICRODISCECTOMY N/A 02/04/2014   Procedure: RIGHT L2-3 MICRODISCECTOMY ;  Surgeon: Lynwood FORBES Better, MD;  Location: [facility] OR;  Service:  Orthopedics;  Laterality: N/A;   MASTECTOMY WITH AXILLARY LYMPH NODE DISSECTION  12/2003   right (03/20/2012)   PARATHYROIDECTOMY Left 10/11/2022   Procedure: MINIMALLY INVASIVE LEFT INFERIOR PARATHYROIDECTOMY;  Surgeon: Rubin Calamity, MD;  Location: [facility] OR;  Service: General;  Laterality: Left;   POSTERIOR CERVICAL FUSION/FORAMINOTOMY  2001   SHOULDER ARTHROSCOPY W/ ROTATOR CUFF REPAIR  2003   Left   THYROIDECTOMY  1977   Right   TOTAL KNEE ARTHROPLASTY  2000   Right   US  of abdomen  07/2005   Fatty liver / kidney stones   Patient Active Problem List   Diagnosis Date Noted   Class 2 obesity 12/14/2023   Hyperglycemia due to type 2 diabetes mellitus (HCC) 12/14/2023   Urinary incontinence 08/09/2023   Metallic taste 05/11/2023   S/P parathyroidectomy 10/11/2022   Allergic rhinitis 07/25/2022   Syncope 07/21/2022   Sinus  bradycardia 07/21/2022   Hammer toe of second toe of left foot 05/05/2021   Corns and callosities 05/05/2021   Hyperlipidemia associated with type 2 diabetes mellitus (HCC) 02/01/2021   Depression 02/01/2021   Localized osteoporosis without current pathological fracture 05/01/2020   Hyperparathyroidism (HCC) 12/13/2019   Elevated hemoglobin (HCC) 12/13/2019   Chronic renal impairment, stage 4 (severe) (HCC) 10/28/2019   Anemia 10/28/2019   Physical exam 06/26/2019   Neck pain 04/18/2019   Ascending aortic aneurysm (HCC) 04/17/2019   Nonspecific chest pain 04/15/2019   Neuropathic pain of right flank 12/27/2018   Dysphagia 12/27/2018   Postsurgical hypothyroidism 06/11/2018   Vitamin D  deficiency 06/11/2018   Multinodular goiter 06/11/2018   DM type 2 causing vascular disease (HCC)    Peripheral neuropathy    Migraine    Irritable bowel syndrome    Essential hypertension    History of right mastectomy    H/O hiatal hernia    GERD (gastroesophageal reflux disease)    Family history of anesthesia complication    Other chronic pain    Type 2 diabetes mellitus with diabetic chronic kidney disease (HCC)    Lower extremity edema    Chronic constipation 06/23/2015   Chronic diastolic heart failure (HCC) 11/17/2014   Herniated nucleus pulposus, L2-3 right 02/04/2014    Class: Acute   Spondylolisthesis of lumbar region 04/30/2012    Class: Chronic   Lumbar stenosis with neurogenic claudication 04/30/2012    Class: Chronic   SYNCOPE-CAROTID SINUS 09/16/2008   OSTEOARTHRITIS 08/23/2006   DDD (degenerative disc disease), cervical 08/23/2006   DISC DISEASE, LUMBAR 08/23/2006   Headache(784.0) 08/23/2006   BREAST CANCER, HX OF 08/23/2006   RENAL CALCULUS, HX OF 08/23/2006    PCP: Mahlon Comer BRAVO, MD   REFERRING PROVIDER: Georgina Ozell LABOR, MD  REFERRING DIAG: M54.50 (ICD-10-CM) - Low back pain, unspecified back pain laterality, unspecified chronicity, unspecified whether  sciatica present  Rationale for Evaluation and Treatment: Rehabilitation  THERAPY DIAG:  Muscle weakness (generalized)  Other abnormalities of gait and mobility  Other low back pain  ONSET DATE: chronic   SUBJECTIVE:  SUBJECTIVE STATEMENT: Pt reports her back has been hurting more since Sunday night-unsure why. Spinal cord stimulator has not been working well.   PERTINENT HISTORY:  Chronic LBP, hx Lumbar surgery, CKD, hx breast cancer, DM, neuropathy, osteoporosis, does have spinal cord stimulator  PAIN:  Are you having pain? Yes: NPRS scale: R hip 4/10; 9/10 Pain location: low back, abdomen  Pain description: achy Aggravating factors: walking/standing Relieving factors: laying down  PRECAUTIONS: None  WEIGHT BEARING RESTRICTIONS: No  FALLS:  Has patient fallen in last 6 months? No  PLOF: Independent  PATIENT GOALS: stand up and cook   OBJECTIVE: (objective measures from initial evaluation unless otherwise dated)  PATIENT SURVEYS: Modified Oswestry 30/50  5/20: 24/50 10/17/23: 18 / 50 7/24: 11/10/23 30/50 12/14/23: 24/50 (48%)  SCREENING FOR RED FLAGS: Bowel or bladder incontinence: No Spinal tumors: No Cauda equina syndrome: No Compression fracture: No Abdominal aneurysm: No  COGNITION: Overall cognitive status: Within functional limits for tasks assessed     SENSATION: WFL  POSTURE: rounded shoulders, forward head, decreased lumbar lordosis, increased thoracic kyphosis, and flexed trunk   PALPATION: TTP lumbar and thoracic paraspinals with greatest tenderness throughout lumbar spine.  LUMBAR ROM:   AROM eval 5/20 11/10/23 12/14/23  Flexion 25% limited Full 0% limited 0% limited   Extension 90% limited * 40% limited 40% limited 40% limited  Right lateral flexion 75%  limited * 25% limited* 25% limited* 10%limited  Left lateral flexion 75% limited* 40% limited 25% limited 10% limited  Right rotation 50% limited* 15% limited 0% limited 10% limited  Left rotation 50% limited* Full 0% limited 10% limited   (Blank rows = not tested) * = pain/symptoms    LOWER EXTREMITY MMT:  unable to test extension as patient unable to get into prone  MMT Right eval Left eval Right 5/20 Left 5/20 Right 10/17/23 Left 10/17/23 Right 11/10/23 Left 11/10/23 Right 12/14/23 Left 12/14/23  Hip flexion 4+ 4+ 4+ 4 4+ 4+ 5 4+ 5 4+  Hip extension            Hip abduction 4- 3+ 4+ (seated) 4-(seated) 4+ (seated) 4 + (seated) 4+ 4+ 5 5  Hip adduction            Hip internal rotation            Hip external rotation            Knee flexion 4+ 4+ 4+ 4 4+ 4+ 5 5 5 5   Knee extension 4+ 4+ 4+ 4 4+ 4+ 5 5 5 5   Ankle dorsiflexion 4+ 4+ 5 4+        Ankle plantarflexion            Ankle inversion            Ankle eversion             (Blank rows = not tested) * = pain/symptoms    FUNCTIONAL TESTS:  5 times sit to stand: 27.59 seconds with UE use 08/08/23:  22.5s with UE use 5/20: 20.03s without UE use (lowered plinth) 10/17/23: 24.58 seconds without UE use in chair 11/10/23: 23.89 seconds with intermittent UE use in chair - increased difficulty due to R hip pain 12/14/23: 40.81 with intermittent UE use in chair-L knee pain  GAIT: Distance walked: 100 feet Assistive device utilized: Single point cane Level of assistance: Modified independence Comments: labored, decreased foot clearance  TODAY'S TREATMENT:  DATE:   12/26/23: Nu-step L5 xmin Seated lumbar flexion stretch 4x10sec -LTR x20ea -manual HS and ITB stretching R LE -seated marching with TrA 2x20 -DKTC with LES on physioball x76min -Seated clam with GTB x30 -Hooklying adductor sqz 5 2x10 -Seated  assisted partial sit up using pink ball 2x10 -PNF chops with pink ball (cues for core engagement) x10ea -STS from elevated plinth 2x10 -resisted side stepping at rail-RTB at ankles- x1 lap   12/19/23 Seated trunk flexion stretch 5 x 10 second holds Standing hip abduction RTB at knees 2 x 10  Standing hip extension RTB at knees 2 x 10  Seated Row RTB 3 x 15 Seated shoulder extension RTB 3 x 15 Palof press 1 x 10  12/14/23 Nu step L3 x49min MODI Updated MMT and ROM 5xSTS Review of goals Sit to stands 2x10 (cues for technique)    11/21/23 NuStep L5, UE/LE x 6 min Standing hip abduction RTB at knees 2 x 10  Standing hip extension RTB at knees 3 x 10  Seated trunk flexion stretch 5 x 10 second holds Lateral step up and over 2 x 10 Seated Row RTB 3 x 15 Seated shoulder extension RTB 2 x 20 Seated ab iso with green ball 2 x 10 with 5 second holds STS from elevated plinth 1x10  11/15/23 -NuStep L5, UE/LE x 5 min  -SKTC 30sec x3ea -LTR x20ea -manual HS and ITB stretching R LE -supine marching with TrA 2x20 -Hooklying oblique reaches 2x10ea -DKTC with LES on physioball x56min -Seated clam with GTB 2x30 -Seated adductor sqz 5 2x10 -Seated assisted partial sit up using pink ball 2x10 -Seated fwd flexion stretch 10 x5 to each side  -PNF chops with pink ball (cues for core engagement) 2x10ea -STS from elevated plinth 2x10   11/13/23 -NuStep L5, UE/LE x 5 min  -SKTC 30sec x3ea -LTR x10ea -supine marching with TrA 2x20 -Hooklying oblique reaches 2x10ea -DKTC with LES on physioball x78min -Seated clam with GTB x30 -Seated adductor sqz 5 2x10 -Seated assisted partial sit up using pink ball 2x10 -Seated fwd flexion stretch 10 x5  -PNF chops with pink ball (cues for core engagement) 2x10ea -STS from elevated plinth 2x10      PATIENT EDUCATION:  Education details: exercise rationale, progression/modification 11/10/23 reassessment findings, POC Person educated:  Patient Education method: Explanation, Demonstration, Education comprehension: verbalized understanding, returned demonstration, verbal cues required, and tactile cues required  HOME EXERCISE PROGRAM: Access Code: [facility] URL: [email or web address]   ASSESSMENT:  CLINICAL IMPRESSION:  Pt with increased lumbar discomfort today. Pt reported pain was unchanged with stretching interventions. Able to participate in PT exercises today without c/o worsening pain. Requires slight elevation of plinth in order to complete STS without assist. Pt required cues for full trunk extension with this. Pt very challenged by side stepping with RTB placed around ankles due to strength deficits. Discussed appropriate standing/sitting frequency. Pt plans to set up MD appt to discuss ongoing lumbar pain as well as to discuss her spinal stimulator.     OBJECTIVE IMPAIRMENTS: Abnormal gait, decreased activity tolerance, decreased balance, decreased endurance, decreased mobility, difficulty walking, decreased ROM, decreased strength, increased muscle spasms, impaired flexibility, impaired sensation, improper body mechanics, postural dysfunction, and pain.   ACTIVITY LIMITATIONS: carrying, lifting, bending, sitting, standing, squatting, stairs, transfers, reach over head, hygiene/grooming, locomotion level, and caring for others  PARTICIPATION LIMITATIONS: meal prep, cleaning, laundry, shopping, community activity, and yard work  PERSONAL FACTORS: Fitness, Time since onset of injury/illness/exacerbation, and 3+ comorbidities:  Chronic LBP, hx Lumbar surgery, CKD, hx breast cancer, DM, neuropathy, osteoporosis  are also affecting patient's functional outcome.   REHAB POTENTIAL: Good  CLINICAL DECISION MAKING: Evolving/moderate complexity  EVALUATION COMPLEXITY: Moderate   GOALS: Goals reviewed with patient? Yes  SHORT TERM GOALS: Target date: 08/11/2023    Patient will be independent with HEP  in order to improve functional outcomes. Baseline:  Goal status: MET 5/20  2.  Patient will report at least 25% improvement in symptoms for improved quality of life. Baseline:  Goal status: MET 08/11/23   LONG TERM GOALS: Target date: 09/08/2023    Patient will report at least 75% improvement in symptoms for improved quality of life. Baseline:  Goal status: IN PROGRESS (50% 9/4)  2.  Patient will improve modified oswestry score by at least 8 points in order to indicate improved tolerance to activity. Baseline:  Goal status: MET (improved by 6 points 5/20)  3.  Patient will demonstrate at least 25% improvement in lumbar ROM in all restricted planes for improved ability to move trunk while completing chores and cooking. Baseline:  Goal status: MET 5/20  4.  Patient will be able to complete 5x STS in under 15 seconds in order to demonstrate improved functional mobility/strength Baseline: see above Goal status: In progress - 9/4  5.  Patient will demonstrate grade of 5/5 MMT grade in all tested musculature as evidence of improved strength to assist with stair ambulation and gait.   Baseline:  Goal status: IN PROGRESS 9/4     PLAN:  PT FREQUENCY: 2x/week  PT DURATION: 4 weeks  PLANNED INTERVENTIONS: 97164- PT Re-evaluation, 97110-Therapeutic exercises, 97530- Therapeutic activity, 97112- Neuromuscular re-education, 97535- Self Care, [id number]- Manual therapy, [phone]- Gait training, [phone]- Orthotic Fit/training, [phone]- Canalith repositioning, [id number]- Aquatic Therapy, [phone]- Splinting, Patient/Family education, Balance training, Stair training, Taping, Dry Needling, Joint mobilization, Joint manipulation, Spinal manipulation, Spinal mobilization, Scar mobilization, and DME instructions.  PLAN FOR NEXT SESSION: f/u with HEP, spinal mobility, core and glute and LE strength, functional strength    Asberry FORBES Rodes, PTA 12/26/2023, 2:34 PM  [facility] [address] [place], [place], [address] Phone: [phone]   Fax:  [phone]    Referring diagnosis? M54.50 (ICD-10-CM) - Low back pain, unspecified back pain laterality, unspecified chronicity, unspecified whether sciatica present Treatment diagnosis? (if different than referring diagnosis) m54.59 What was this (referring dx) caused by? []  Surgery []  Fall [x]  Ongoing issue []  Arthritis []  Other: ____________  Laterality: []  Rt []  Lt [x]  Both  Check all possible CPT codes:  *CHOOSE 10 OR LESS*    See Planned Interventions listed in the Plan section of the Evaluation.

## 2023-12-28 ENCOUNTER — Ambulatory Visit (HOSPITAL_BASED_OUTPATIENT_CLINIC_OR_DEPARTMENT_OTHER)

## 2023-12-28 ENCOUNTER — Encounter (HOSPITAL_BASED_OUTPATIENT_CLINIC_OR_DEPARTMENT_OTHER): Payer: Self-pay

## 2023-12-28 DIAGNOSIS — M6281 Muscle weakness (generalized): Secondary | ICD-10-CM

## 2023-12-28 DIAGNOSIS — R2689 Other abnormalities of gait and mobility: Secondary | ICD-10-CM

## 2023-12-28 DIAGNOSIS — M5459 Other low back pain: Secondary | ICD-10-CM | POA: Diagnosis not present

## 2023-12-28 NOTE — Therapy (Signed)
 OUTPATIENT PHYSICAL THERAPY THORACOLUMBAR NOTE      Patient Name: [name] MRN: [id number] DOB:Nov 16, 1950, 73 y.o., female Today's Date: 12/28/2023     END OF SESSION:  PT End of Session - 12/28/23 1140     Visit Number 29    Number of Visits 48    Date for Recertification  01/11/24    Authorization Type [employer]    Authorization Time Period 18 visits approved from 11/13/2023-01/13/2024    Authorization - Visit Number 6    Authorization - Number of Visits 18    Progress Note Due on Visit 32    PT Start Time 1139    PT Stop Time 1225    PT Time Calculation (min) 46 min    Activity Tolerance Patient tolerated treatment well    Behavior During Therapy [facility] for tasks assessed/performed                          Past Medical History:  Diagnosis Date   Anginal pain (HCC) 03/19/2012   saw Dr. Calhoun .. she thinks its more related to stomach issues   Arthritis    all over; mostly in my back (03/20/2012)   Breast cancer (HCC)    right (03/20/2012)   Cardiomyopathy    Chest pain 06/2005   Hospitalized, dystolic dysfunction   Chronic lower back pain    have 2 herniated discs; going to have to have a fusion (03/20/2012)   CKD (chronic kidney disease), stage III (HCC)    Family history of anesthesia complication    father has nausea and vomiting   Gallstones    GERD (gastroesophageal reflux disease)    H/O hiatal hernia    History of kidney stones    History of right mastectomy    Hypertension    patient states never had HTN, takes Hyzaar for heart   Hypothyroidism    Irritable bowel syndrome    Migraines    it's been a long time since I've had one (03/20/2012)   Osteoarthritis    Peripheral neuropathy    PONV (postoperative nausea and vomiting)    Sciatica    Sinus bradycardia    Sleep apnea    mild, no CPAP   Type II diabetes mellitus (HCC)    Past Surgical History:  Procedure Laterality Date   ABDOMINAL HYSTERECTOMY   1993   adenosin cardiolite   07/2005   (+) wall motion abnormality   AXILLARY LYMPH NODE DISSECTION  2003   squamous cell cancer   BACK SURGERY     CARDIAC CATHETERIZATION  ? 2005 / 2007   CHOLECYSTECTOMY N/A 01/24/2018   Procedure: LAPAROSCOPIC CHOLECYSTECTOMY;  Surgeon: Vanderbilt Ned, MD;  Location: [facility] OR;  Service: General;  Laterality: N/A;   CT abdomen and pelvis  02/2010   Same   ESOPHAGOGASTRODUODENOSCOPY  2005   GERD   KNEE ARTHROSCOPY  1990's   right (03/20/2012)   LUMBAR FUSION N/A 08/22/2014   Procedure: Right L2-3 and right L1-2 transforaminal lumbar interbody fusion with pedicle screws, rods, sleeves, cages, local bone graft, Vivigen, cancellous chips;  Surgeon: Lynwood FORBES Better, MD;  Location: [facility] OR;  Service: Orthopedics;  Laterality: N/A;   LUMBAR LAMINECTOMY/DECOMPRESSION MICRODISCECTOMY N/A 02/04/2014   Procedure: RIGHT L2-3 MICRODISCECTOMY ;  Surgeon: Lynwood FORBES Better, MD;  Location: [facility] OR;  Service: Orthopedics;  Laterality: N/A;   MASTECTOMY WITH AXILLARY LYMPH NODE DISSECTION  12/2003   right (03/20/2012)   PARATHYROIDECTOMY  Left 10/11/2022   Procedure: MINIMALLY INVASIVE LEFT INFERIOR PARATHYROIDECTOMY;  Surgeon: Rubin Calamity, MD;  Location: [facility] OR;  Service: General;  Laterality: Left;   POSTERIOR CERVICAL FUSION/FORAMINOTOMY  2001   SHOULDER ARTHROSCOPY W/ ROTATOR CUFF REPAIR  2003   Left   THYROIDECTOMY  1977   Right   TOTAL KNEE ARTHROPLASTY  2000   Right   US  of abdomen  07/2005   Fatty liver / kidney stones   Patient Active Problem List   Diagnosis Date Noted   Class 2 obesity 12/14/2023   Hyperglycemia due to type 2 diabetes mellitus (HCC) 12/14/2023   Urinary incontinence 08/09/2023   Metallic taste 05/11/2023   S/P parathyroidectomy 10/11/2022   Allergic rhinitis 07/25/2022   Syncope 07/21/2022   Sinus bradycardia 07/21/2022   Hammer toe of second toe of left foot 05/05/2021   Corns and callosities 05/05/2021   Hyperlipidemia associated  with type 2 diabetes mellitus (HCC) 02/01/2021   Depression 02/01/2021   Localized osteoporosis without current pathological fracture 05/01/2020   Hyperparathyroidism (HCC) 12/13/2019   Elevated hemoglobin (HCC) 12/13/2019   Chronic renal impairment, stage 4 (severe) (HCC) 10/28/2019   Anemia 10/28/2019   Physical exam 06/26/2019   Neck pain 04/18/2019   Ascending aortic aneurysm (HCC) 04/17/2019   Nonspecific chest pain 04/15/2019   Neuropathic pain of right flank 12/27/2018   Dysphagia 12/27/2018   Postsurgical hypothyroidism 06/11/2018   Vitamin D  deficiency 06/11/2018   Multinodular goiter 06/11/2018   DM type 2 causing vascular disease (HCC)    Peripheral neuropathy    Migraine    Irritable bowel syndrome    Essential hypertension    History of right mastectomy    H/O hiatal hernia    GERD (gastroesophageal reflux disease)    Family history of anesthesia complication    Other chronic pain    Type 2 diabetes mellitus with diabetic chronic kidney disease (HCC)    Lower extremity edema    Chronic constipation 06/23/2015   Chronic diastolic heart failure (HCC) 11/17/2014   Herniated nucleus pulposus, L2-3 right 02/04/2014    Class: Acute   Spondylolisthesis of lumbar region 04/30/2012    Class: Chronic   Lumbar stenosis with neurogenic claudication 04/30/2012    Class: Chronic   SYNCOPE-CAROTID SINUS 09/16/2008   OSTEOARTHRITIS 08/23/2006   DDD (degenerative disc disease), cervical 08/23/2006   DISC DISEASE, LUMBAR 08/23/2006   Headache(784.0) 08/23/2006   BREAST CANCER, HX OF 08/23/2006   RENAL CALCULUS, HX OF 08/23/2006    PCP: Mahlon Comer BRAVO, MD   REFERRING PROVIDER: Georgina Ozell LABOR, MD  REFERRING DIAG: M54.50 (ICD-10-CM) - Low back pain, unspecified back pain laterality, unspecified chronicity, unspecified whether sciatica present  Rationale for Evaluation and Treatment: Rehabilitation  THERAPY DIAG:  Muscle weakness (generalized)  Other  abnormalities of gait and mobility  Other low back pain  ONSET DATE: chronic   SUBJECTIVE:  SUBJECTIVE STATEMENT: Pt reports her back is feeling a little bit better today.    PERTINENT HISTORY:  Chronic LBP, hx Lumbar surgery, CKD, hx breast cancer, DM, neuropathy, osteoporosis, does have spinal cord stimulator  PAIN:  Are you having pain? Yes: NPRS scale: R hip 4/10; 9/10 Pain location: low back, abdomen  Pain description: achy Aggravating factors: walking/standing Relieving factors: laying down  PRECAUTIONS: None  WEIGHT BEARING RESTRICTIONS: No  FALLS:  Has patient fallen in last 6 months? No  PLOF: Independent  PATIENT GOALS: stand up and cook   OBJECTIVE: (objective measures from initial evaluation unless otherwise dated)  PATIENT SURVEYS: Modified Oswestry 30/50  5/20: 24/50 10/17/23: 18 / 50 7/24: 11/10/23 30/50 12/14/23: 24/50 (48%)  SCREENING FOR RED FLAGS: Bowel or bladder incontinence: No Spinal tumors: No Cauda equina syndrome: No Compression fracture: No Abdominal aneurysm: No  COGNITION: Overall cognitive status: Within functional limits for tasks assessed     SENSATION: WFL  POSTURE: rounded shoulders, forward head, decreased lumbar lordosis, increased thoracic kyphosis, and flexed trunk   PALPATION: TTP lumbar and thoracic paraspinals with greatest tenderness throughout lumbar spine.  LUMBAR ROM:   AROM eval 5/20 11/10/23 12/14/23  Flexion 25% limited Full 0% limited 0% limited   Extension 90% limited * 40% limited 40% limited 40% limited  Right lateral flexion 75% limited * 25% limited* 25% limited* 10%limited  Left lateral flexion 75% limited* 40% limited 25% limited 10% limited  Right rotation 50% limited* 15% limited 0% limited 10% limited  Left rotation  50% limited* Full 0% limited 10% limited   (Blank rows = not tested) * = pain/symptoms    LOWER EXTREMITY MMT:  unable to test extension as patient unable to get into prone  MMT Right eval Left eval Right 5/20 Left 5/20 Right 10/17/23 Left 10/17/23 Right 11/10/23 Left 11/10/23 Right 12/14/23 Left 12/14/23  Hip flexion 4+ 4+ 4+ 4 4+ 4+ 5 4+ 5 4+  Hip extension            Hip abduction 4- 3+ 4+ (seated) 4-(seated) 4+ (seated) 4 + (seated) 4+ 4+ 5 5  Hip adduction            Hip internal rotation            Hip external rotation            Knee flexion 4+ 4+ 4+ 4 4+ 4+ 5 5 5 5   Knee extension 4+ 4+ 4+ 4 4+ 4+ 5 5 5 5   Ankle dorsiflexion 4+ 4+ 5 4+        Ankle plantarflexion            Ankle inversion            Ankle eversion             (Blank rows = not tested) * = pain/symptoms    FUNCTIONAL TESTS:  5 times sit to stand: 27.59 seconds with UE use 08/08/23:  22.5s with UE use 5/20: 20.03s without UE use (lowered plinth) 10/17/23: 24.58 seconds without UE use in chair 11/10/23: 23.89 seconds with intermittent UE use in chair - increased difficulty due to R hip pain 12/14/23: 40.81 with intermittent UE use in chair-L knee pain  GAIT: Distance walked: 100 feet Assistive device utilized: Single point cane Level of assistance: Modified independence Comments: labored, decreased foot clearance  TODAY'S TREATMENT:  DATE:    12/28/23: Nu-step L5 xmin Seated lumbar flexion stretch 4x10sec -manual HS and ITB stretching R LE -seated marching with TrA 2x20 -DKTC with LES on physioball x87min -Seated clam with GTB x30 -Hooklying adductor sqz 5 2x10 -Seated assisted partial sit up using pink ball 2x10 -PNF chops with pink ball (cues for core engagement) 2x10ea -STS from elevated plinth 2x10 -resisted side stepping at rail-RTB at ankles- x1 lap  12/26/23: Nu-step L5  xmin Seated lumbar flexion stretch 4x10sec -LTR x20ea -manual HS and ITB stretching R LE -seated marching with TrA 2x20 -DKTC with LES on physioball x32min -Seated clam with GTB x30 -Hooklying adductor sqz 5 2x10 -Seated assisted partial sit up using pink ball 2x10 -PNF chops with pink ball (cues for core engagement) x10ea -STS from elevated plinth 2x10 -resisted side stepping at rail-RTB at ankles- x1 lap   12/19/23 Seated trunk flexion stretch 5 x 10 second holds Standing hip abduction RTB at knees 2 x 10  Standing hip extension RTB at knees 2 x 10  Seated Row RTB 3 x 15 Seated shoulder extension RTB 3 x 15 Palof press 1 x 10  12/14/23 Nu step L3 x20min MODI Updated MMT and ROM 5xSTS Review of goals Sit to stands 2x10 (cues for technique)    11/21/23 NuStep L5, UE/LE x 6 min Standing hip abduction RTB at knees 2 x 10  Standing hip extension RTB at knees 3 x 10  Seated trunk flexion stretch 5 x 10 second holds Lateral step up and over 2 x 10 Seated Row RTB 3 x 15 Seated shoulder extension RTB 2 x 20 Seated ab iso with green ball 2 x 10 with 5 second holds STS from elevated plinth 1x10  11/15/23 -NuStep L5, UE/LE x 5 min  -SKTC 30sec x3ea -LTR x20ea -manual HS and ITB stretching R LE -supine marching with TrA 2x20 -Hooklying oblique reaches 2x10ea -DKTC with LES on physioball x78min -Seated clam with GTB 2x30 -Seated adductor sqz 5 2x10 -Seated assisted partial sit up using pink ball 2x10 -Seated fwd flexion stretch 10 x5 to each side  -PNF chops with pink ball (cues for core engagement) 2x10ea -STS from elevated plinth 2x10   11/13/23 -NuStep L5, UE/LE x 5 min  -SKTC 30sec x3ea -LTR x10ea -supine marching with TrA 2x20 -Hooklying oblique reaches 2x10ea -DKTC with LES on physioball x66min -Seated clam with GTB x30 -Seated adductor sqz 5 2x10 -Seated assisted partial sit up using pink ball 2x10 -Seated fwd flexion stretch 10 x5  -PNF chops with pink ball  (cues for core engagement) 2x10ea -STS from elevated plinth 2x10      PATIENT EDUCATION:  Education details: exercise rationale, progression/modification 11/10/23 reassessment findings, POC Person educated: Patient Education method: Explanation, Demonstration, Education comprehension: verbalized understanding, returned demonstration, verbal cues required, and tactile cues required  HOME EXERCISE PROGRAM: Access Code: [facility] URL: [email or web address]   ASSESSMENT:  CLINICAL IMPRESSION:  Pt with improved tolerance for exercise today. Remains tight into bilateral HS, especially on L LE. Improving tolerance for core strengthening. Good effort given with sit to stands, which pt does demonstrate mild difficulty with. Cues required with this for full hip and trunk extension upon standing. Pt remains challenged by resisted side stepping.      OBJECTIVE IMPAIRMENTS: Abnormal gait, decreased activity tolerance, decreased balance, decreased endurance, decreased mobility, difficulty walking, decreased ROM, decreased strength, increased muscle spasms, impaired flexibility, impaired sensation, improper body mechanics, postural dysfunction, and pain.   ACTIVITY LIMITATIONS: carrying,  lifting, bending, sitting, standing, squatting, stairs, transfers, reach over head, hygiene/grooming, locomotion level, and caring for others  PARTICIPATION LIMITATIONS: meal prep, cleaning, laundry, shopping, community activity, and yard work  PERSONAL FACTORS: Fitness, Time since onset of injury/illness/exacerbation, and 3+ comorbidities: Chronic LBP, hx Lumbar surgery, CKD, hx breast cancer, DM, neuropathy, osteoporosis  are also affecting patient's functional outcome.   REHAB POTENTIAL: Good  CLINICAL DECISION MAKING: Evolving/moderate complexity  EVALUATION COMPLEXITY: Moderate   GOALS: Goals reviewed with patient? Yes  SHORT TERM GOALS: Target date: 08/11/2023    Patient will be  independent with HEP in order to improve functional outcomes. Baseline:  Goal status: MET 5/20  2.  Patient will report at least 25% improvement in symptoms for improved quality of life. Baseline:  Goal status: MET 08/11/23   LONG TERM GOALS: Target date: 09/08/2023    Patient will report at least 75% improvement in symptoms for improved quality of life. Baseline:  Goal status: IN PROGRESS (50% 9/4)  2.  Patient will improve modified oswestry score by at least 8 points in order to indicate improved tolerance to activity. Baseline:  Goal status: MET (improved by 6 points 5/20)  3.  Patient will demonstrate at least 25% improvement in lumbar ROM in all restricted planes for improved ability to move trunk while completing chores and cooking. Baseline:  Goal status: MET 5/20  4.  Patient will be able to complete 5x STS in under 15 seconds in order to demonstrate improved functional mobility/strength Baseline: see above Goal status: In progress - 9/4  5.  Patient will demonstrate grade of 5/5 MMT grade in all tested musculature as evidence of improved strength to assist with stair ambulation and gait.   Baseline:  Goal status: IN PROGRESS 9/4     PLAN:  PT FREQUENCY: 2x/week  PT DURATION: 4 weeks  PLANNED INTERVENTIONS: [id number]- PT Re-evaluation, 97110-Therapeutic exercises, 97530- Therapeutic activity, 97112- Neuromuscular re-education, 97535- Self Care, [id number]- Manual therapy, [phone]- Gait training, [phone]- Orthotic Fit/training, [phone]- Canalith repositioning, [id number]- Aquatic Therapy, [phone]- Splinting, Patient/Family education, Balance training, Stair training, Taping, Dry Needling, Joint mobilization, Joint manipulation, Spinal manipulation, Spinal mobilization, Scar mobilization, and DME instructions.  PLAN FOR NEXT SESSION: f/u with HEP, spinal mobility, core and glute and LE strength, functional strength    Asberry FORBES Rodes, PTA 12/28/2023, 2:19 PM  [facility]  [facility] [address] [place], [place], [address] Phone: [phone]   Fax:  [phone]    Referring diagnosis? M54.50 (ICD-10-CM) - Low back pain, unspecified back pain laterality, unspecified chronicity, unspecified whether sciatica present Treatment diagnosis? (if different than referring diagnosis) m54.59 What was this (referring dx) caused by? []  Surgery []  Fall [x]  Ongoing issue []  Arthritis []  Other: ____________  Laterality: []  Rt []  Lt [x]  Both  Check all possible CPT codes:  *CHOOSE 10 OR LESS*    See Planned Interventions listed in the Plan section of the Evaluation.

## 2024-01-01 DIAGNOSIS — N184 Chronic kidney disease, stage 4 (severe): Secondary | ICD-10-CM | POA: Diagnosis not present

## 2024-01-02 ENCOUNTER — Encounter (HOSPITAL_BASED_OUTPATIENT_CLINIC_OR_DEPARTMENT_OTHER): Payer: Self-pay

## 2024-01-02 ENCOUNTER — Ambulatory Visit (HOSPITAL_BASED_OUTPATIENT_CLINIC_OR_DEPARTMENT_OTHER)

## 2024-01-02 DIAGNOSIS — M5459 Other low back pain: Secondary | ICD-10-CM | POA: Diagnosis not present

## 2024-01-02 DIAGNOSIS — R2689 Other abnormalities of gait and mobility: Secondary | ICD-10-CM | POA: Diagnosis not present

## 2024-01-02 DIAGNOSIS — M6281 Muscle weakness (generalized): Secondary | ICD-10-CM | POA: Diagnosis not present

## 2024-01-02 NOTE — Therapy (Signed)
 OUTPATIENT PHYSICAL THERAPY THORACOLUMBAR NOTE      Patient Name: [name] MRN: [id number] DOB:1951/02/06, 73 y.o., female Today's Date: 01/02/2024     END OF SESSION:  PT End of Session - 01/02/24 1305     Visit Number 30    Number of Visits 48    Date for Recertification  01/11/24    Authorization Type [employer]    Authorization Time Period 18 visits approved from 11/13/2023-01/13/2024    Authorization - Visit Number 7    Authorization - Number of Visits 18    Progress Note Due on Visit 32    PT Start Time 1302    PT Stop Time 1345    PT Time Calculation (min) 43 min    Activity Tolerance Patient tolerated treatment well    Behavior During Therapy [facility] for tasks assessed/performed                           Past Medical History:  Diagnosis Date   Anginal pain 03/19/2012   saw Dr. Calhoun .. she thinks its more related to stomach issues   Arthritis    all over; mostly in my back (03/20/2012)   Breast cancer (HCC)    right (03/20/2012)   Cardiomyopathy    Chest pain 06/2005   Hospitalized, dystolic dysfunction   Chronic lower back pain    have 2 herniated discs; going to have to have a fusion (03/20/2012)   CKD (chronic kidney disease), stage III (HCC)    Family history of anesthesia complication    father has nausea and vomiting   Gallstones    GERD (gastroesophageal reflux disease)    H/O hiatal hernia    History of kidney stones    History of right mastectomy    Hypertension    patient states never had HTN, takes Hyzaar for heart   Hypothyroidism    Irritable bowel syndrome    Migraines    it's been a long time since I've had one (03/20/2012)   Osteoarthritis    Peripheral neuropathy    PONV (postoperative nausea and vomiting)    Sciatica    Sinus bradycardia    Sleep apnea    mild, no CPAP   Type II diabetes mellitus (HCC)    Past Surgical History:  Procedure Laterality Date   ABDOMINAL HYSTERECTOMY   1993   adenosin cardiolite   07/2005   (+) wall motion abnormality   AXILLARY LYMPH NODE DISSECTION  2003   squamous cell cancer   BACK SURGERY     CARDIAC CATHETERIZATION  ? 2005 / 2007   CHOLECYSTECTOMY N/A 01/24/2018   Procedure: LAPAROSCOPIC CHOLECYSTECTOMY;  Surgeon: Vanderbilt Ned, MD;  Location: [facility] OR;  Service: General;  Laterality: N/A;   CT abdomen and pelvis  02/2010   Same   ESOPHAGOGASTRODUODENOSCOPY  2005   GERD   KNEE ARTHROSCOPY  1990's   right (03/20/2012)   LUMBAR FUSION N/A 08/22/2014   Procedure: Right L2-3 and right L1-2 transforaminal lumbar interbody fusion with pedicle screws, rods, sleeves, cages, local bone graft, Vivigen, cancellous chips;  Surgeon: Lynwood FORBES Better, MD;  Location: [facility] OR;  Service: Orthopedics;  Laterality: N/A;   LUMBAR LAMINECTOMY/DECOMPRESSION MICRODISCECTOMY N/A 02/04/2014   Procedure: RIGHT L2-3 MICRODISCECTOMY ;  Surgeon: Lynwood FORBES Better, MD;  Location: [facility] OR;  Service: Orthopedics;  Laterality: N/A;   MASTECTOMY WITH AXILLARY LYMPH NODE DISSECTION  12/2003   right (03/20/2012)   PARATHYROIDECTOMY  Left 10/11/2022   Procedure: MINIMALLY INVASIVE LEFT INFERIOR PARATHYROIDECTOMY;  Surgeon: Rubin Calamity, MD;  Location: [facility] OR;  Service: General;  Laterality: Left;   POSTERIOR CERVICAL FUSION/FORAMINOTOMY  2001   SHOULDER ARTHROSCOPY W/ ROTATOR CUFF REPAIR  2003   Left   THYROIDECTOMY  1977   Right   TOTAL KNEE ARTHROPLASTY  2000   Right   US  of abdomen  07/2005   Fatty liver / kidney stones   Patient Active Problem List   Diagnosis Date Noted   Class 2 obesity 12/14/2023   Hyperglycemia due to type 2 diabetes mellitus (HCC) 12/14/2023   Urinary incontinence 08/09/2023   Metallic taste 05/11/2023   S/P parathyroidectomy 10/11/2022   Allergic rhinitis 07/25/2022   Syncope 07/21/2022   Sinus bradycardia 07/21/2022   Hammer toe of second toe of left foot 05/05/2021   Corns and callosities 05/05/2021   Hyperlipidemia associated  with type 2 diabetes mellitus (HCC) 02/01/2021   Depression 02/01/2021   Localized osteoporosis without current pathological fracture 05/01/2020   Hyperparathyroidism 12/13/2019   Elevated hemoglobin 12/13/2019   Chronic renal impairment, stage 4 (severe) 10/28/2019   Anemia 10/28/2019   Physical exam 06/26/2019   Neck pain 04/18/2019   Ascending aortic aneurysm 04/17/2019   Nonspecific chest pain 04/15/2019   Neuropathic pain of right flank 12/27/2018   Dysphagia 12/27/2018   Postsurgical hypothyroidism 06/11/2018   Vitamin D  deficiency 06/11/2018   Multinodular goiter 06/11/2018   DM type 2 causing vascular disease (HCC)    Peripheral neuropathy    Migraine    Irritable bowel syndrome    Essential hypertension    History of right mastectomy    H/O hiatal hernia    GERD (gastroesophageal reflux disease)    Family history of anesthesia complication    Other chronic pain    Type 2 diabetes mellitus with diabetic chronic kidney disease (HCC)    Lower extremity edema    Chronic constipation 06/23/2015   Chronic diastolic heart failure (HCC) 11/17/2014   Herniated nucleus pulposus, L2-3 right 02/04/2014    Class: Acute   Spondylolisthesis of lumbar region 04/30/2012    Class: Chronic   Lumbar stenosis with neurogenic claudication 04/30/2012    Class: Chronic   SYNCOPE-CAROTID SINUS 09/16/2008   OSTEOARTHRITIS 08/23/2006   DDD (degenerative disc disease), cervical 08/23/2006   DISC DISEASE, LUMBAR 08/23/2006   Headache(784.0) 08/23/2006   BREAST CANCER, HX OF 08/23/2006   RENAL CALCULUS, HX OF 08/23/2006    PCP: Mahlon Comer BRAVO, MD   REFERRING PROVIDER: Georgina Ozell LABOR, MD  REFERRING DIAG: M54.50 (ICD-10-CM) - Low back pain, unspecified back pain laterality, unspecified chronicity, unspecified whether sciatica present  Rationale for Evaluation and Treatment: Rehabilitation  THERAPY DIAG:  Muscle weakness (generalized)  Other abnormalities of gait and  mobility  Other low back pain  ONSET DATE: chronic   SUBJECTIVE:  SUBJECTIVE STATEMENT: Pt reports her back is feeling pretty good today. Has stiffness when sitting a long time.    PERTINENT HISTORY:  Chronic LBP, hx Lumbar surgery, CKD, hx breast cancer, DM, neuropathy, osteoporosis, does have spinal cord stimulator  PAIN:  Are you having pain? Yes: NPRS scale: R hip 4/10; 9/10 Pain location: low back, abdomen  Pain description: achy Aggravating factors: walking/standing Relieving factors: laying down  PRECAUTIONS: None  WEIGHT BEARING RESTRICTIONS: No  FALLS:  Has patient fallen in last 6 months? No  PLOF: Independent  PATIENT GOALS: stand up and cook   OBJECTIVE: (objective measures from initial evaluation unless otherwise dated)  PATIENT SURVEYS: Modified Oswestry 30/50  5/20: 24/50 10/17/23: 18 / 50 7/24: 11/10/23 30/50 12/14/23: 24/50 (48%)  SCREENING FOR RED FLAGS: Bowel or bladder incontinence: No Spinal tumors: No Cauda equina syndrome: No Compression fracture: No Abdominal aneurysm: No  COGNITION: Overall cognitive status: Within functional limits for tasks assessed     SENSATION: WFL  POSTURE: rounded shoulders, forward head, decreased lumbar lordosis, increased thoracic kyphosis, and flexed trunk   PALPATION: TTP lumbar and thoracic paraspinals with greatest tenderness throughout lumbar spine.  LUMBAR ROM:   AROM eval 5/20 11/10/23 12/14/23  Flexion 25% limited Full 0% limited 0% limited   Extension 90% limited * 40% limited 40% limited 40% limited  Right lateral flexion 75% limited * 25% limited* 25% limited* 10%limited  Left lateral flexion 75% limited* 40% limited 25% limited 10% limited  Right rotation 50% limited* 15% limited 0% limited 10% limited  Left  rotation 50% limited* Full 0% limited 10% limited   (Blank rows = not tested) * = pain/symptoms    LOWER EXTREMITY MMT:  unable to test extension as patient unable to get into prone  MMT Right eval Left eval Right 5/20 Left 5/20 Right 10/17/23 Left 10/17/23 Right 11/10/23 Left 11/10/23 Right 12/14/23 Left 12/14/23  Hip flexion 4+ 4+ 4+ 4 4+ 4+ 5 4+ 5 4+  Hip extension            Hip abduction 4- 3+ 4+ (seated) 4-(seated) 4+ (seated) 4 + (seated) 4+ 4+ 5 5  Hip adduction            Hip internal rotation            Hip external rotation            Knee flexion 4+ 4+ 4+ 4 4+ 4+ 5 5 5 5   Knee extension 4+ 4+ 4+ 4 4+ 4+ 5 5 5 5   Ankle dorsiflexion 4+ 4+ 5 4+        Ankle plantarflexion            Ankle inversion            Ankle eversion             (Blank rows = not tested) * = pain/symptoms    FUNCTIONAL TESTS:  5 times sit to stand: 27.59 seconds with UE use 08/08/23:  22.5s with UE use 5/20: 20.03s without UE use (lowered plinth) 10/17/23: 24.58 seconds without UE use in chair 11/10/23: 23.89 seconds with intermittent UE use in chair - increased difficulty due to R hip pain 12/14/23: 40.81 with intermittent UE use in chair-L knee pain  GAIT: Distance walked: 100 feet Assistive device utilized: Single point cane Level of assistance: Modified independence Comments: labored, decreased foot clearance  TODAY'S TREATMENT:  DATE:   01/02/24: Nu-step L5 x82min Seated lumbar flexion stretch 6x10sec -manual HS and piriformis stretching R LE -hooklying marching with TrA 2x10 -Seated clam with GTB 2x30 -Hooklying adductor sqz 5 2x10 -Seated assisted partial sit up using pink ball 2x10 -PNF chops with pink ball (cues for core engagement) 2x10ea   12/28/23: Nu-step L5 xmin Seated lumbar flexion stretch 4x10sec -manual HS and ITB stretching R LE -seated marching  with TrA 2x20 -DKTC with LES on physioball x10min -Seated clam with GTB x30 -Hooklying adductor sqz 5 2x10 -Seated assisted partial sit up using pink ball 2x10 -PNF chops with pink ball (cues for core engagement) 2x10ea -STS from elevated plinth 2x10 -resisted side stepping at rail-RTB at ankles- x1 lap  12/26/23: Nu-step L5 xmin Seated lumbar flexion stretch 4x10sec -LTR x20ea -manual HS and ITB stretching R LE -seated marching with TrA 2x20 -DKTC with LES on physioball x49min -Seated clam with GTB x30 -Hooklying adductor sqz 5 2x10 -Seated assisted partial sit up using pink ball 2x10 -PNF chops with pink ball (cues for core engagement) x10ea -STS from elevated plinth 2x10 -resisted side stepping at rail-RTB at ankles- x1 lap   12/19/23 Seated trunk flexion stretch 5 x 10 second holds Standing hip abduction RTB at knees 2 x 10  Standing hip extension RTB at knees 2 x 10  Seated Row RTB 3 x 15 Seated shoulder extension RTB 3 x 15 Palof press 1 x 10  12/14/23 Nu step L3 x9min MODI Updated MMT and ROM 5xSTS Review of goals Sit to stands 2x10 (cues for technique)    11/21/23 NuStep L5, UE/LE x 6 min Standing hip abduction RTB at knees 2 x 10  Standing hip extension RTB at knees 3 x 10  Seated trunk flexion stretch 5 x 10 second holds Lateral step up and over 2 x 10 Seated Row RTB 3 x 15 Seated shoulder extension RTB 2 x 20 Seated ab iso with green ball 2 x 10 with 5 second holds STS from elevated plinth 1x10  11/15/23 -NuStep L5, UE/LE x 5 min  -SKTC 30sec x3ea -LTR x20ea -manual HS and ITB stretching R LE -supine marching with TrA 2x20 -Hooklying oblique reaches 2x10ea -DKTC with LES on physioball x22min -Seated clam with GTB 2x30 -Seated adductor sqz 5 2x10 -Seated assisted partial sit up using pink ball 2x10 -Seated fwd flexion stretch 10 x5 to each side  -PNF chops with pink ball (cues for core engagement) 2x10ea -STS from elevated plinth  2x10   11/13/23 -NuStep L5, UE/LE x 5 min  -SKTC 30sec x3ea -LTR x10ea -supine marching with TrA 2x20 -Hooklying oblique reaches 2x10ea -DKTC with LES on physioball x54min -Seated clam with GTB x30 -Seated adductor sqz 5 2x10 -Seated assisted partial sit up using pink ball 2x10 -Seated fwd flexion stretch 10 x5  -PNF chops with pink ball (cues for core engagement) 2x10ea -STS from elevated plinth 2x10      PATIENT EDUCATION:  Education details: exercise rationale, progression/modification 11/10/23 reassessment findings, POC Person educated: Patient Education method: Explanation, Demonstration, Education comprehension: verbalized understanding, returned demonstration, verbal cues required, and tactile cues required  HOME EXERCISE PROGRAM: Access Code: [facility] URL: [email or web address]   ASSESSMENT:  CLINICAL IMPRESSION:  Pt remains tight into bilateral HS and piriofmris mm. Spent time on stretching for these areas with good tolerance. Continued to work on core engagement with exercises, with occasional cuing required. Difficulty with supine to sit transfers due to back tightness/discomfort. Will continue to progress as  tolerated.      OBJECTIVE IMPAIRMENTS: Abnormal gait, decreased activity tolerance, decreased balance, decreased endurance, decreased mobility, difficulty walking, decreased ROM, decreased strength, increased muscle spasms, impaired flexibility, impaired sensation, improper body mechanics, postural dysfunction, and pain.   ACTIVITY LIMITATIONS: carrying, lifting, bending, sitting, standing, squatting, stairs, transfers, reach over head, hygiene/grooming, locomotion level, and caring for others  PARTICIPATION LIMITATIONS: meal prep, cleaning, laundry, shopping, community activity, and yard work  PERSONAL FACTORS: Fitness, Time since onset of injury/illness/exacerbation, and 3+ comorbidities: Chronic LBP, hx Lumbar surgery, CKD, hx breast  cancer, DM, neuropathy, osteoporosis  are also affecting patient's functional outcome.   REHAB POTENTIAL: Good  CLINICAL DECISION MAKING: Evolving/moderate complexity  EVALUATION COMPLEXITY: Moderate   GOALS: Goals reviewed with patient? Yes  SHORT TERM GOALS: Target date: 08/11/2023    Patient will be independent with HEP in order to improve functional outcomes. Baseline:  Goal status: MET 5/20  2.  Patient will report at least 25% improvement in symptoms for improved quality of life. Baseline:  Goal status: MET 08/11/23   LONG TERM GOALS: Target date: 09/08/2023    Patient will report at least 75% improvement in symptoms for improved quality of life. Baseline:  Goal status: IN PROGRESS (50% 9/4)  2.  Patient will improve modified oswestry score by at least 8 points in order to indicate improved tolerance to activity. Baseline:  Goal status: MET (improved by 6 points 5/20)  3.  Patient will demonstrate at least 25% improvement in lumbar ROM in all restricted planes for improved ability to move trunk while completing chores and cooking. Baseline:  Goal status: MET 5/20  4.  Patient will be able to complete 5x STS in under 15 seconds in order to demonstrate improved functional mobility/strength Baseline: see above Goal status: In progress - 9/4  5.  Patient will demonstrate grade of 5/5 MMT grade in all tested musculature as evidence of improved strength to assist with stair ambulation and gait.   Baseline:  Goal status: IN PROGRESS 9/4     PLAN:  PT FREQUENCY: 2x/week  PT DURATION: 4 weeks  PLANNED INTERVENTIONS: [id number]- PT Re-evaluation, 97110-Therapeutic exercises, 97530- Therapeutic activity, 97112- Neuromuscular re-education, 97535- Self Care, [id number]- Manual therapy, [phone]- Gait training, [phone]- Orthotic Fit/training, [phone]- Canalith repositioning, [id number]- Aquatic Therapy, [phone]- Splinting, Patient/Family education, Balance training, Stair training, Taping, Dry  Needling, Joint mobilization, Joint manipulation, Spinal manipulation, Spinal mobilization, Scar mobilization, and DME instructions.  PLAN FOR NEXT SESSION: f/u with HEP, spinal mobility, core and glute and LE strength, functional strength    Asberry FORBES Rodes, PTA 01/02/2024, 2:53 PM  [facility] [address] [place], [place], [address] Phone: [phone]   Fax:  [phone]    Referring diagnosis? M54.50 (ICD-10-CM) - Low back pain, unspecified back pain laterality, unspecified chronicity, unspecified whether sciatica present Treatment diagnosis? (if different than referring diagnosis) m54.59 What was this (referring dx) caused by? []  Surgery []  Fall [x]  Ongoing issue []  Arthritis []  Other: ____________  Laterality: []  Rt []  Lt [x]  Both  Check all possible CPT codes:  *CHOOSE 10 OR LESS*    See Planned Interventions listed in the Plan section of the Evaluation.

## 2024-01-05 ENCOUNTER — Encounter (HOSPITAL_BASED_OUTPATIENT_CLINIC_OR_DEPARTMENT_OTHER): Payer: Self-pay | Admitting: Physical Therapy

## 2024-01-05 ENCOUNTER — Ambulatory Visit (HOSPITAL_BASED_OUTPATIENT_CLINIC_OR_DEPARTMENT_OTHER): Admitting: Physical Therapy

## 2024-01-05 DIAGNOSIS — R2689 Other abnormalities of gait and mobility: Secondary | ICD-10-CM | POA: Diagnosis not present

## 2024-01-05 DIAGNOSIS — M6281 Muscle weakness (generalized): Secondary | ICD-10-CM | POA: Diagnosis not present

## 2024-01-05 DIAGNOSIS — M5459 Other low back pain: Secondary | ICD-10-CM

## 2024-01-05 NOTE — Therapy (Addendum)
 OUTPATIENT PHYSICAL THERAPY THORACOLUMBAR NOTE      Patient Name: [name] MRN: [id number] DOB:October 03, 1950, 73 y.o., female Today's Date: 01/05/2024     END OF SESSION:  PT End of Session - 01/05/24 1105     Visit Number 31    Number of Visits 48    Date for Recertification  01/11/24    Authorization Type [employer]    Authorization Time Period 18 visits approved from 11/13/2023-01/13/2024    Authorization - Visit Number 8    Authorization - Number of Visits 18    Progress Note Due on Visit 32    PT Start Time 1105    PT Stop Time 1145    PT Time Calculation (min) 40 min    Activity Tolerance Patient tolerated treatment well    Behavior During Therapy [facility] for tasks assessed/performed               Past Medical History:  Diagnosis Date   Anginal pain 03/19/2012   saw Dr. Calhoun .. she thinks its more related to stomach issues   Arthritis    all over; mostly in my back (03/20/2012)   Breast cancer (HCC)    right (03/20/2012)   Cardiomyopathy    Chest pain 06/2005   Hospitalized, dystolic dysfunction   Chronic lower back pain    have 2 herniated discs; going to have to have a fusion (03/20/2012)   CKD (chronic kidney disease), stage III (HCC)    Family history of anesthesia complication    father has nausea and vomiting   Gallstones    GERD (gastroesophageal reflux disease)    H/O hiatal hernia    History of kidney stones    History of right mastectomy    Hypertension    patient states never had HTN, takes Hyzaar for heart   Hypothyroidism    Irritable bowel syndrome    Migraines    it's been a long time since I've had one (03/20/2012)   Osteoarthritis    Peripheral neuropathy    PONV (postoperative nausea and vomiting)    Sciatica    Sinus bradycardia    Sleep apnea    mild, no CPAP   Type II diabetes mellitus (HCC)    Past Surgical History:  Procedure Laterality Date   ABDOMINAL HYSTERECTOMY  1993   adenosin  cardiolite   07/2005   (+) wall motion abnormality   AXILLARY LYMPH NODE DISSECTION  2003   squamous cell cancer   BACK SURGERY     CARDIAC CATHETERIZATION  ? 2005 / 2007   CHOLECYSTECTOMY N/A 01/24/2018   Procedure: LAPAROSCOPIC CHOLECYSTECTOMY;  Surgeon: Vanderbilt Ned, MD;  Location: [facility] OR;  Service: General;  Laterality: N/A;   CT abdomen and pelvis  02/2010   Same   ESOPHAGOGASTRODUODENOSCOPY  2005   GERD   KNEE ARTHROSCOPY  1990's   right (03/20/2012)   LUMBAR FUSION N/A 08/22/2014   Procedure: Right L2-3 and right L1-2 transforaminal lumbar interbody fusion with pedicle screws, rods, sleeves, cages, local bone graft, Vivigen, cancellous chips;  Surgeon: Lynwood FORBES Better, MD;  Location: [facility] OR;  Service: Orthopedics;  Laterality: N/A;   LUMBAR LAMINECTOMY/DECOMPRESSION MICRODISCECTOMY N/A 02/04/2014   Procedure: RIGHT L2-3 MICRODISCECTOMY ;  Surgeon: Lynwood FORBES Better, MD;  Location: [facility] OR;  Service: Orthopedics;  Laterality: N/A;   MASTECTOMY WITH AXILLARY LYMPH NODE DISSECTION  12/2003   right (03/20/2012)   PARATHYROIDECTOMY Left 10/11/2022   Procedure: MINIMALLY INVASIVE LEFT INFERIOR PARATHYROIDECTOMY;  Surgeon:  Rubin Calamity, MD;  Location: [facility] OR;  Service: General;  Laterality: Left;   POSTERIOR CERVICAL FUSION/FORAMINOTOMY  2001   SHOULDER ARTHROSCOPY W/ ROTATOR CUFF REPAIR  2003   Left   THYROIDECTOMY  1977   Right   TOTAL KNEE ARTHROPLASTY  2000   Right   US  of abdomen  07/2005   Fatty liver / kidney stones   Patient Active Problem List   Diagnosis Date Noted   Class 2 obesity 12/14/2023   Hyperglycemia due to type 2 diabetes mellitus (HCC) 12/14/2023   Urinary incontinence 08/09/2023   Metallic taste 05/11/2023   S/P parathyroidectomy 10/11/2022   Allergic rhinitis 07/25/2022   Syncope 07/21/2022   Sinus bradycardia 07/21/2022   Hammer toe of second toe of left foot 05/05/2021   Corns and callosities 05/05/2021   Hyperlipidemia associated with type 2 diabetes  mellitus (HCC) 02/01/2021   Depression 02/01/2021   Localized osteoporosis without current pathological fracture 05/01/2020   Hyperparathyroidism 12/13/2019   Elevated hemoglobin 12/13/2019   Chronic renal impairment, stage 4 (severe) 10/28/2019   Anemia 10/28/2019   Physical exam 06/26/2019   Neck pain 04/18/2019   Ascending aortic aneurysm 04/17/2019   Nonspecific chest pain 04/15/2019   Neuropathic pain of right flank 12/27/2018   Dysphagia 12/27/2018   Postsurgical hypothyroidism 06/11/2018   Vitamin D  deficiency 06/11/2018   Multinodular goiter 06/11/2018   DM type 2 causing vascular disease (HCC)    Peripheral neuropathy    Migraine    Irritable bowel syndrome    Essential hypertension    History of right mastectomy    H/O hiatal hernia    GERD (gastroesophageal reflux disease)    Family history of anesthesia complication    Other chronic pain    Type 2 diabetes mellitus with diabetic chronic kidney disease (HCC)    Lower extremity edema    Chronic constipation 06/23/2015   Chronic diastolic heart failure (HCC) 11/17/2014   Herniated nucleus pulposus, L2-3 right 02/04/2014    Class: Acute   Spondylolisthesis of lumbar region 04/30/2012    Class: Chronic   Lumbar stenosis with neurogenic claudication 04/30/2012    Class: Chronic   SYNCOPE-CAROTID SINUS 09/16/2008   OSTEOARTHRITIS 08/23/2006   DDD (degenerative disc disease), cervical 08/23/2006   DISC DISEASE, LUMBAR 08/23/2006   Headache(784.0) 08/23/2006   BREAST CANCER, HX OF 08/23/2006   RENAL CALCULUS, HX OF 08/23/2006    PCP: Mahlon Comer BRAVO, MD   REFERRING PROVIDER: Georgina Ozell LABOR, MD  REFERRING DIAG: M54.50 (ICD-10-CM) - Low back pain, unspecified back pain laterality, unspecified chronicity, unspecified whether sciatica present  Rationale for Evaluation and Treatment: Rehabilitation  THERAPY DIAG:  Muscle weakness (generalized)  Other abnormalities of gait and mobility  Other low back  pain  ONSET DATE: chronic   SUBJECTIVE:  SUBJECTIVE STATEMENT: Pt reports she is feeling good today. States stretching back has helped the most.   PERTINENT HISTORY:  Chronic LBP, hx Lumbar surgery, CKD, hx breast cancer, DM, neuropathy, osteoporosis, does have spinal cord stimulator  PAIN:  Are you having pain? Yes: NPRS scale: R hip 4/10; 9/10 Pain location: low back, abdomen  Pain description: achy Aggravating factors: walking/standing Relieving factors: laying down  PRECAUTIONS: None  WEIGHT BEARING RESTRICTIONS: No  FALLS:  Has patient fallen in last 6 months? No  PLOF: Independent  PATIENT GOALS: stand up and cook   OBJECTIVE: (objective measures from initial evaluation unless otherwise dated)  PATIENT SURVEYS: Modified Oswestry 30/50  5/20: 24/50 10/17/23: 18 / 50 7/24: 11/10/23 30/50 12/14/23: 24/50 (48%)  SCREENING FOR RED FLAGS: Bowel or bladder incontinence: No Spinal tumors: No Cauda equina syndrome: No Compression fracture: No Abdominal aneurysm: No  COGNITION: Overall cognitive status: Within functional limits for tasks assessed     SENSATION: WFL  POSTURE: rounded shoulders, forward head, decreased lumbar lordosis, increased thoracic kyphosis, and flexed trunk   PALPATION: TTP lumbar and thoracic paraspinals with greatest tenderness throughout lumbar spine.  LUMBAR ROM:   AROM eval 5/20 11/10/23 12/14/23  Flexion 25% limited Full 0% limited 0% limited   Extension 90% limited * 40% limited 40% limited 40% limited  Right lateral flexion 75% limited * 25% limited* 25% limited* 10%limited  Left lateral flexion 75% limited* 40% limited 25% limited 10% limited  Right rotation 50% limited* 15% limited 0% limited 10% limited  Left rotation 50% limited* Full 0% limited 10%  limited   (Blank rows = not tested) * = pain/symptoms    LOWER EXTREMITY MMT:  unable to test extension as patient unable to get into prone  MMT Right eval Left eval Right 5/20 Left 5/20 Right 10/17/23 Left 10/17/23 Right 11/10/23 Left 11/10/23 Right 12/14/23 Left 12/14/23  Hip flexion 4+ 4+ 4+ 4 4+ 4+ 5 4+ 5 4+  Hip extension            Hip abduction 4- 3+ 4+ (seated) 4-(seated) 4+ (seated) 4 + (seated) 4+ 4+ 5 5  Hip adduction            Hip internal rotation            Hip external rotation            Knee flexion 4+ 4+ 4+ 4 4+ 4+ 5 5 5 5   Knee extension 4+ 4+ 4+ 4 4+ 4+ 5 5 5 5   Ankle dorsiflexion 4+ 4+ 5 4+        Ankle plantarflexion            Ankle inversion            Ankle eversion             (Blank rows = not tested) * = pain/symptoms    FUNCTIONAL TESTS:  5 times sit to stand: 27.59 seconds with UE use 08/08/23:  22.5s with UE use 5/20: 20.03s without UE use (lowered plinth) 10/17/23: 24.58 seconds without UE use in chair 11/10/23: 23.89 seconds with intermittent UE use in chair - increased difficulty due to R hip pain 12/14/23: 40.81 with intermittent UE use in chair-L knee pain  GAIT: Distance walked: 100 feet Assistive device utilized: Single point cane Level of assistance: Modified independence Comments: labored, decreased foot clearance  TODAY'S TREATMENT:  DATE:  9/256/25 NuStep x5 min lvl 5  Leg extension machine 10# 3x10 Leg curl machine 3x20 at 10# (half reps) Lumbar ball roll outs 2x10 Supine HS stretch with strap 2x30 second bilaterally   01/02/24: Nu-step L5 x32min Seated lumbar flexion stretch 6x10sec -manual HS and piriformis stretching R LE -hooklying marching with TrA 2x10 -Seated clam with GTB 2x30 -Hooklying adductor sqz 5 2x10 -Seated assisted partial sit up using pink ball 2x10 -PNF chops with pink ball (cues for core  engagement) 2x10ea  12/28/23: Nu-step L5 xmin Seated lumbar flexion stretch 4x10sec -manual HS and ITB stretching R LE -seated marching with TrA 2x20 -DKTC with LES on physioball x74min -Seated clam with GTB x30 -Hooklying adductor sqz 5 2x10 -Seated assisted partial sit up using pink ball 2x10 -PNF chops with pink ball (cues for core engagement) 2x10ea -STS from elevated plinth 2x10 -resisted side stepping at rail-RTB at ankles- x1 lap  12/26/23: Nu-step L5 xmin Seated lumbar flexion stretch 4x10sec -LTR x20ea -manual HS and ITB stretching R LE -seated marching with TrA 2x20 -DKTC with LES on physioball x80min -Seated clam with GTB x30 -Hooklying adductor sqz 5 2x10 -Seated assisted partial sit up using pink ball 2x10 -PNF chops with pink ball (cues for core engagement) x10ea -STS from elevated plinth 2x10 -resisted side stepping at rail-RTB at ankles- x1 lap  12/19/23 Seated trunk flexion stretch 5 x 10 second holds Standing hip abduction RTB at knees 2 x 10  Standing hip extension RTB at knees 2 x 10  Seated Row RTB 3 x 15 Seated shoulder extension RTB 3 x 15 Palof press 1 x 10  12/14/23 Nu step L3 x57min MODI Updated MMT and ROM 5xSTS Review of goals Sit to stands 2x10 (cues for technique)  11/21/23 NuStep L5, UE/LE x 6 min Standing hip abduction RTB at knees 2 x 10  Standing hip extension RTB at knees 3 x 10  Seated trunk flexion stretch 5 x 10 second holds Lateral step up and over 2 x 10 Seated Row RTB 3 x 15 Seated shoulder extension RTB 2 x 20 Seated ab iso with green ball 2 x 10 with 5 second holds STS from elevated plinth 1x10  11/15/23 -NuStep L5, UE/LE x 5 min  -SKTC 30sec x3ea -LTR x20ea -manual HS and ITB stretching R LE -supine marching with TrA 2x20 -Hooklying oblique reaches 2x10ea -DKTC with LES on physioball x32min -Seated clam with GTB 2x30 -Seated adductor sqz 5 2x10 -Seated assisted partial sit up using pink ball 2x10 -Seated fwd flexion  stretch 10 x5 to each side  -PNF chops with pink ball (cues for core engagement) 2x10ea -STS from elevated plinth 2x10   11/13/23 -NuStep L5, UE/LE x 5 min  -SKTC 30sec x3ea -LTR x10ea -supine marching with TrA 2x20 -Hooklying oblique reaches 2x10ea -DKTC with LES on physioball x106min -Seated clam with GTB x30 -Seated adductor sqz 5 2x10 -Seated assisted partial sit up using pink ball 2x10 -Seated fwd flexion stretch 10 x5  -PNF chops with pink ball (cues for core engagement) 2x10ea -STS from elevated plinth 2x10      PATIENT EDUCATION:  Education details: exercise rationale, progression/modification 11/10/23 reassessment findings, POC Person educated: Patient Education method: Explanation, Demonstration, Education comprehension: verbalized understanding, returned demonstration, verbal cues required, and tactile cues required  HOME EXERCISE PROGRAM: Access Code: [facility] URL: [email or web address]   ASSESSMENT:  CLINICAL IMPRESSION:  Pt tolerated new exercises well and is continuing to progress. Treatment focused on LE strengthening exercises  and pain management for low back. Verbal cueing for breathing during exercise and body mechanics to improve safety. Patient continues to demonstrate difficulty with sit to stand. Continue to progress LE strengthening exercises as tolerated.   OBJECTIVE IMPAIRMENTS: Abnormal gait, decreased activity tolerance, decreased balance, decreased endurance, decreased mobility, difficulty walking, decreased ROM, decreased strength, increased muscle spasms, impaired flexibility, impaired sensation, improper body mechanics, postural dysfunction, and pain.   ACTIVITY LIMITATIONS: carrying, lifting, bending, sitting, standing, squatting, stairs, transfers, reach over head, hygiene/grooming, locomotion level, and caring for others  PARTICIPATION LIMITATIONS: meal prep, cleaning, laundry, shopping, community activity, and yard  work  PERSONAL FACTORS: Fitness, Time since onset of injury/illness/exacerbation, and 3+ comorbidities: Chronic LBP, hx Lumbar surgery, CKD, hx breast cancer, DM, neuropathy, osteoporosis  are also affecting patient's functional outcome.   REHAB POTENTIAL: Good  CLINICAL DECISION MAKING: Evolving/moderate complexity  EVALUATION COMPLEXITY: Moderate   GOALS: Goals reviewed with patient? Yes  SHORT TERM GOALS: Target date: 08/11/2023    Patient will be independent with HEP in order to improve functional outcomes. Baseline:  Goal status: MET 5/20  2.  Patient will report at least 25% improvement in symptoms for improved quality of life. Baseline:  Goal status: MET 08/11/23   LONG TERM GOALS: Target date: 09/08/2023    Patient will report at least 75% improvement in symptoms for improved quality of life. Baseline:  Goal status: IN PROGRESS (50% 9/4)  2.  Patient will improve modified oswestry score by at least 8 points in order to indicate improved tolerance to activity. Baseline:  Goal status: MET (improved by 6 points 5/20)  3.  Patient will demonstrate at least 25% improvement in lumbar ROM in all restricted planes for improved ability to move trunk while completing chores and cooking. Baseline:  Goal status: MET 5/20  4.  Patient will be able to complete 5x STS in under 15 seconds in order to demonstrate improved functional mobility/strength Baseline: see above Goal status: In progress - 9/4  5.  Patient will demonstrate grade of 5/5 MMT grade in all tested musculature as evidence of improved strength to assist with stair ambulation and gait.   Baseline:  Goal status: IN PROGRESS 9/4     PLAN:  PT FREQUENCY: 2x/week  PT DURATION: 4 weeks  PLANNED INTERVENTIONS: [id number]- PT Re-evaluation, 97110-Therapeutic exercises, 97530- Therapeutic activity, 97112- Neuromuscular re-education, 97535- Self Care, [id number]- Manual therapy, [phone]- Gait training, [phone]- Orthotic  Fit/training, [phone]- Canalith repositioning, [id number]- Aquatic Therapy, [phone]- Splinting, Patient/Family education, Balance training, Stair training, Taping, Dry Needling, Joint mobilization, Joint manipulation, Spinal manipulation, Spinal mobilization, Scar mobilization, and DME instructions.  PLAN FOR NEXT SESSION: f/u with HEP, spinal mobility, core and glute and LE strength, functional strength    Lili Finder, Student-PT 01/05/2024, 11:06 AM  This entire session was performed under direct supervision and direction of a licensed therapist/therapist assistant . I have personally read, edited and approve of the note as written. 12:32 PM, 01/05/24 Prentice CANDIE Stains PT, DPT Physical Therapist at [facility] [address] [place], [place], [address] Phone: [phone]   Fax:  [phone]   Referring diagnosis? M54.50 (ICD-10-CM) - Low back pain, unspecified back pain laterality, unspecified chronicity, unspecified whether sciatica present Treatment diagnosis? (if different than referring diagnosis) m54.59 What was this (referring dx) caused by? []  Surgery []  Fall [x]  Ongoing issue []  Arthritis []  Other: ____________  Laterality: []  Rt []  Lt [x]  Both  Check all possible CPT codes:  *  CHOOSE 10 OR LESS*    See Planned Interventions listed in the Plan section of the Evaluation.

## 2024-01-08 DIAGNOSIS — R809 Proteinuria, unspecified: Secondary | ICD-10-CM | POA: Diagnosis not present

## 2024-01-08 DIAGNOSIS — I130 Hypertensive heart and chronic kidney disease with heart failure and stage 1 through stage 4 chronic kidney disease, or unspecified chronic kidney disease: Secondary | ICD-10-CM | POA: Diagnosis not present

## 2024-01-08 DIAGNOSIS — I5032 Chronic diastolic (congestive) heart failure: Secondary | ICD-10-CM | POA: Diagnosis not present

## 2024-01-08 DIAGNOSIS — I272 Pulmonary hypertension, unspecified: Secondary | ICD-10-CM | POA: Diagnosis not present

## 2024-01-08 DIAGNOSIS — E1122 Type 2 diabetes mellitus with diabetic chronic kidney disease: Secondary | ICD-10-CM | POA: Diagnosis not present

## 2024-01-08 DIAGNOSIS — N184 Chronic kidney disease, stage 4 (severe): Secondary | ICD-10-CM | POA: Diagnosis not present

## 2024-01-09 ENCOUNTER — Encounter (HOSPITAL_BASED_OUTPATIENT_CLINIC_OR_DEPARTMENT_OTHER): Payer: Self-pay | Admitting: Physical Therapy

## 2024-01-09 ENCOUNTER — Ambulatory Visit (HOSPITAL_BASED_OUTPATIENT_CLINIC_OR_DEPARTMENT_OTHER): Admitting: Physical Therapy

## 2024-01-09 DIAGNOSIS — M6281 Muscle weakness (generalized): Secondary | ICD-10-CM

## 2024-01-09 DIAGNOSIS — M5459 Other low back pain: Secondary | ICD-10-CM | POA: Diagnosis not present

## 2024-01-09 DIAGNOSIS — R2689 Other abnormalities of gait and mobility: Secondary | ICD-10-CM

## 2024-01-09 NOTE — Therapy (Addendum)
 OUTPATIENT PHYSICAL THERAPY THORACOLUMBAR NOTE      Patient Name: [name] MRN: [id number] DOB:1950/08/09, 73 y.o., female Today's Date: 01/09/2024   Progress Note   Reporting Period 12/14/23 to 01/09/24   See note below for Objective Data and Assessment of Progress/Goals  END OF SESSION:   PT End of Session - 01/09/24 1023     Visit Number 32    Number of Visits 48    Date for Recertification  02/20/24    Authorization Type [employer]    Authorization Time Period 18 visits approved from 11/13/2023-01/13/2024    Authorization - Visit Number 9    Authorization - Number of Visits 18    Progress Note Due on Visit 42    PT Start Time 1023    PT Stop Time 1058    PT Time Calculation (min) 35 min    Activity Tolerance Patient tolerated treatment well    Behavior During Therapy [facility] for tasks assessed/performed           Past Medical History:  Diagnosis Date   Anginal pain 03/19/2012   saw Dr. Calhoun .. she thinks its more related to stomach issues   Arthritis    all over; mostly in my back (03/20/2012)   Breast cancer (HCC)    right (03/20/2012)   Cardiomyopathy    Chest pain 06/2005   Hospitalized, dystolic dysfunction   Chronic lower back pain    have 2 herniated discs; going to have to have a fusion (03/20/2012)   CKD (chronic kidney disease), stage III (HCC)    Family history of anesthesia complication    father has nausea and vomiting   Gallstones    GERD (gastroesophageal reflux disease)    H/O hiatal hernia    History of kidney stones    History of right mastectomy    Hypertension    patient states never had HTN, takes Hyzaar for heart   Hypothyroidism    Irritable bowel syndrome    Migraines    it's been a long time since I've had one (03/20/2012)   Osteoarthritis    Peripheral neuropathy    PONV (postoperative nausea and vomiting)    Sciatica    Sinus bradycardia    Sleep apnea    mild, no CPAP   Type II diabetes mellitus  (HCC)    Past Surgical History:  Procedure Laterality Date   ABDOMINAL HYSTERECTOMY  1993   adenosin cardiolite   07/2005   (+) wall motion abnormality   AXILLARY LYMPH NODE DISSECTION  2003   squamous cell cancer   BACK SURGERY     CARDIAC CATHETERIZATION  ? 2005 / 2007   CHOLECYSTECTOMY N/A 01/24/2018   Procedure: LAPAROSCOPIC CHOLECYSTECTOMY;  Surgeon: Vanderbilt Ned, MD;  Location: [facility] OR;  Service: General;  Laterality: N/A;   CT abdomen and pelvis  02/2010   Same   ESOPHAGOGASTRODUODENOSCOPY  2005   GERD   KNEE ARTHROSCOPY  1990's   right (03/20/2012)   LUMBAR FUSION N/A 08/22/2014   Procedure: Right L2-3 and right L1-2 transforaminal lumbar interbody fusion with pedicle screws, rods, sleeves, cages, local bone graft, Vivigen, cancellous chips;  Surgeon: Lynwood FORBES Better, MD;  Location: [facility] OR;  Service: Orthopedics;  Laterality: N/A;   LUMBAR LAMINECTOMY/DECOMPRESSION MICRODISCECTOMY N/A 02/04/2014   Procedure: RIGHT L2-3 MICRODISCECTOMY ;  Surgeon: Lynwood FORBES Better, MD;  Location: [facility] OR;  Service: Orthopedics;  Laterality: N/A;   MASTECTOMY WITH AXILLARY LYMPH NODE DISSECTION  12/2003  right (03/20/2012)   PARATHYROIDECTOMY Left 10/11/2022   Procedure: MINIMALLY INVASIVE LEFT INFERIOR PARATHYROIDECTOMY;  Surgeon: Rubin Calamity, MD;  Location: [facility] OR;  Service: General;  Laterality: Left;   POSTERIOR CERVICAL FUSION/FORAMINOTOMY  2001   SHOULDER ARTHROSCOPY W/ ROTATOR CUFF REPAIR  2003   Left   THYROIDECTOMY  1977   Right   TOTAL KNEE ARTHROPLASTY  2000   Right   US  of abdomen  07/2005   Fatty liver / kidney stones   Patient Active Problem List   Diagnosis Date Noted   Class 2 obesity 12/14/2023   Hyperglycemia due to type 2 diabetes mellitus (HCC) 12/14/2023   Urinary incontinence 08/09/2023   Metallic taste 05/11/2023   S/P parathyroidectomy 10/11/2022   Allergic rhinitis 07/25/2022   Syncope 07/21/2022   Sinus bradycardia 07/21/2022   Hammer toe of second toe of  left foot 05/05/2021   Corns and callosities 05/05/2021   Hyperlipidemia associated with type 2 diabetes mellitus (HCC) 02/01/2021   Depression 02/01/2021   Localized osteoporosis without current pathological fracture 05/01/2020   Hyperparathyroidism 12/13/2019   Elevated hemoglobin 12/13/2019   Chronic renal impairment, stage 4 (severe) 10/28/2019   Anemia 10/28/2019   Physical exam 06/26/2019   Neck pain 04/18/2019   Ascending aortic aneurysm 04/17/2019   Nonspecific chest pain 04/15/2019   Neuropathic pain of right flank 12/27/2018   Dysphagia 12/27/2018   Postsurgical hypothyroidism 06/11/2018   Vitamin D  deficiency 06/11/2018   Multinodular goiter 06/11/2018   DM type 2 causing vascular disease (HCC)    Peripheral neuropathy    Migraine    Irritable bowel syndrome    Essential hypertension    History of right mastectomy    H/O hiatal hernia    GERD (gastroesophageal reflux disease)    Family history of anesthesia complication    Other chronic pain    Type 2 diabetes mellitus with diabetic chronic kidney disease (HCC)    Lower extremity edema    Chronic constipation 06/23/2015   Chronic diastolic heart failure (HCC) 11/17/2014   Herniated nucleus pulposus, L2-3 right 02/04/2014    Class: Acute   Spondylolisthesis of lumbar region 04/30/2012    Class: Chronic   Lumbar stenosis with neurogenic claudication 04/30/2012    Class: Chronic   SYNCOPE-CAROTID SINUS 09/16/2008   OSTEOARTHRITIS 08/23/2006   DDD (degenerative disc disease), cervical 08/23/2006   DISC DISEASE, LUMBAR 08/23/2006   Headache(784.0) 08/23/2006   BREAST CANCER, HX OF 08/23/2006   RENAL CALCULUS, HX OF 08/23/2006    PCP: Mahlon Comer BRAVO, MD   REFERRING PROVIDER: Georgina Ozell LABOR, MD  REFERRING DIAG: M54.50 (ICD-10-CM) - Low back pain, unspecified back pain laterality, unspecified chronicity, unspecified whether sciatica present  Rationale for Evaluation and Treatment:  Rehabilitation  THERAPY DIAG:  Muscle weakness (generalized)  Other abnormalities of gait and mobility  Other low back pain  ONSET DATE: chronic   SUBJECTIVE:  SUBJECTIVE STATEMENT: Pt reports she is feeling good today. States she felt good after last session.   PERTINENT HISTORY:  Chronic LBP, hx Lumbar surgery, CKD, hx breast cancer, DM, neuropathy, osteoporosis, does have spinal cord stimulator  PAIN:  Are you having pain? Yes: NPRS scale: R hip 4/10; 9/10 Pain location: low back, abdomen  Pain description: achy Aggravating factors: walking/standing Relieving factors: laying down  PRECAUTIONS: None  WEIGHT BEARING RESTRICTIONS: No  FALLS:  Has patient fallen in last 6 months? No  PLOF: Independent  PATIENT GOALS: stand up and cook   OBJECTIVE: (objective measures from initial evaluation unless otherwise dated)  PATIENT SURVEYS: Modified Oswestry 30/50  5/20: 24/50 10/17/23: 18 / 50 7/24: 11/10/23 30/50 12/14/23: 24/50 (48%) 01/09/24: 25/50  SCREENING FOR RED FLAGS: Bowel or bladder incontinence: No Spinal tumors: No Cauda equina syndrome: No Compression fracture: No Abdominal aneurysm: No  COGNITION: Overall cognitive status: Within functional limits for tasks assessed     SENSATION: WFL  POSTURE: rounded shoulders, forward head, decreased lumbar lordosis, increased thoracic kyphosis, and flexed trunk   PALPATION: TTP lumbar and thoracic paraspinals with greatest tenderness throughout lumbar spine.  LUMBAR ROM:   AROM eval 5/20 11/10/23 12/14/23  Flexion 25% limited Full 0% limited 0% limited   Extension 90% limited * 40% limited 40% limited 40% limited  Right lateral flexion 75% limited * 25% limited* 25% limited* 10%limited  Left lateral flexion 75% limited* 40% limited  25% limited 10% limited  Right rotation 50% limited* 15% limited 0% limited 10% limited  Left rotation 50% limited* Full 0% limited 10% limited   (Blank rows = not tested) * = pain/symptoms    LOWER EXTREMITY MMT:  unable to test extension as patient unable to get into prone  MMT Right eval Left eval Right 5/20 Left 5/20 Right 10/17/23 Left 10/17/23 Right 11/10/23 Left 11/10/23 Right 12/14/23 Left 12/14/23 Right  9/30 Left  9/30  Hip flexion 4+ 4+ 4+ 4 4+ 4+ 5 4+ 5 4+ 4+ 4+  Hip extension              Hip abduction 4- 3+ 4+ (seated) 4-(seated) 4+ (seated) 4 + (seated) 4+ 4+ 5 5  5   Hip adduction              Hip internal rotation              Hip external rotation              Knee flexion 4+ 4+ 4+ 4 4+ 4+ 5 5 5 5 5 5   Knee extension 4+ 4+ 4+ 4 4+ 4+ 5 5 5 5 5 5   Ankle dorsiflexion 4+ 4+ 5 4+          Ankle plantarflexion              Ankle inversion              Ankle eversion               (Blank rows = not tested) * = pain/symptoms    FUNCTIONAL TESTS:  5 times sit to stand: 27.59 seconds with UE use 08/08/23:  22.5s with UE use 5/20: 20.03s without UE use (lowered plinth) 10/17/23: 24.58 seconds without UE use in chair 11/10/23: 23.89 seconds with intermittent UE use in chair - increased difficulty due to R hip pain 12/14/23: 40.81 with intermittent UE use in chair-L knee pain 01/09/24: 25.37 with no use of UE in chair  GAIT: Distance walked: 100 feet Assistive device utilized: Single point cane Level of assistance: Modified independence Comments: labored, decreased foot clearance  TODAY'S TREATMENT:                                                                                                                              DATE:  01/09/24 NuStep x5 min lvl 5  Reassessment  Discussion of POC, HEP  01/04/24 NuStep x5 min lvl 5  Leg extension machine 10# 3x10 Leg curl machine 3x20 at 10# (half reps) Lumbar ball roll outs 2x10 Supine HS stretch with strap 2x30 second  bilaterally   01/02/24: Nu-step L5 x39min Seated lumbar flexion stretch 6x10sec -manual HS and piriformis stretching R LE -hooklying marching with TrA 2x10 -Seated clam with GTB 2x30 -Hooklying adductor sqz 5 2x10 -Seated assisted partial sit up using pink ball 2x10 -PNF chops with pink ball (cues for core engagement) 2x10ea  12/28/23: Nu-step L5 xmin Seated lumbar flexion stretch 4x10sec -manual HS and ITB stretching R LE -seated marching with TrA 2x20 -DKTC with LES on physioball x72min -Seated clam with GTB x30 -Hooklying adductor sqz 5 2x10 -Seated assisted partial sit up using pink ball 2x10 -PNF chops with pink ball (cues for core engagement) 2x10ea -STS from elevated plinth 2x10 -resisted side stepping at rail-RTB at ankles- x1 lap  12/26/23: Nu-step L5 xmin Seated lumbar flexion stretch 4x10sec -LTR x20ea -manual HS and ITB stretching R LE -seated marching with TrA 2x20 -DKTC with LES on physioball x74min -Seated clam with GTB x30 -Hooklying adductor sqz 5 2x10 -Seated assisted partial sit up using pink ball 2x10 -PNF chops with pink ball (cues for core engagement) x10ea -STS from elevated plinth 2x10 -resisted side stepping at rail-RTB at ankles- x1 lap  12/19/23 Seated trunk flexion stretch 5 x 10 second holds Standing hip abduction RTB at knees 2 x 10  Standing hip extension RTB at knees 2 x 10  Seated Row RTB 3 x 15 Seated shoulder extension RTB 3 x 15 Palof press 1 x 10  12/14/23 Nu step L3 x15min MODI Updated MMT and ROM 5xSTS Review of goals Sit to stands 2x10 (cues for technique)  11/21/23 NuStep L5, UE/LE x 6 min Standing hip abduction RTB at knees 2 x 10  Standing hip extension RTB at knees 3 x 10  Seated trunk flexion stretch 5 x 10 second holds Lateral step up and over 2 x 10 Seated Row RTB 3 x 15 Seated shoulder extension RTB 2 x 20 Seated ab iso with green ball 2 x 10 with 5 second holds STS from elevated plinth 1x10  11/15/23 -NuStep L5,  UE/LE x 5 min  -SKTC 30sec x3ea -LTR x20ea -manual HS and ITB stretching R LE -supine marching with TrA 2x20 -Hooklying oblique reaches 2x10ea -DKTC with LES on physioball x76min -Seated clam with GTB 2x30 -Seated adductor sqz 5 2x10 -Seated assisted partial sit up using pink ball 2x10 -Seated fwd  flexion stretch 10 x5 to each side  -PNF chops with pink ball (cues for core engagement) 2x10ea -STS from elevated plinth 2x10   11/13/23 -NuStep L5, UE/LE x 5 min  -SKTC 30sec x3ea -LTR x10ea -supine marching with TrA 2x20 -Hooklying oblique reaches 2x10ea -DKTC with LES on physioball x78min -Seated clam with GTB x30 -Seated adductor sqz 5 2x10 -Seated assisted partial sit up using pink ball 2x10 -Seated fwd flexion stretch 10 x5  -PNF chops with pink ball (cues for core engagement) 2x10ea -STS from elevated plinth 2x10      PATIENT EDUCATION:  Education details: exercise rationale, progression/modification 11/10/23 reassessment findings, POC Person educated: Patient Education method: Explanation, Demonstration, Education comprehension: verbalized understanding, returned demonstration, verbal cues required, and tactile cues required  HOME EXERCISE PROGRAM: Access Code: [facility] URL: [email or web address]   ASSESSMENT: CLINICAL IMPRESSION:  Pt has met 2/2 short term goals and 2/5 long term goals. Pt has improved in range of motion and mobility. Remaining goals not met due to remaining  strength deficits and decreased activity tolerance. Patient is continually progressing towards remaining goals and is improving activity tolerance each session. Patient able to complete STS from chair without UE support demonstrating improving functional strength. Extending POC 2x/week for 6 weeks, patient will be out of town for the next week or so. Will develop more advanced HEP for patient over coming weeks to facilitate transition to self management.  Patient has made progress  and will continue to benefit from physical therapy to address remaining limitations.     OBJECTIVE IMPAIRMENTS: Abnormal gait, decreased activity tolerance, decreased balance, decreased endurance, decreased mobility, difficulty walking, decreased ROM, decreased strength, increased muscle spasms, impaired flexibility, impaired sensation, improper body mechanics, postural dysfunction, and pain.   ACTIVITY LIMITATIONS: carrying, lifting, bending, sitting, standing, squatting, stairs, transfers, reach over head, hygiene/grooming, locomotion level, and caring for others  PARTICIPATION LIMITATIONS: meal prep, cleaning, laundry, shopping, community activity, and yard work  PERSONAL FACTORS: Fitness, Time since onset of injury/illness/exacerbation, and 3+ comorbidities: Chronic LBP, hx Lumbar surgery, CKD, hx breast cancer, DM, neuropathy, osteoporosis  are also affecting patient's functional outcome.   REHAB POTENTIAL: Good  CLINICAL DECISION MAKING: Evolving/moderate complexity  EVALUATION COMPLEXITY: Moderate   GOALS: Goals reviewed with patient? Yes  SHORT TERM GOALS: Target date: 08/11/2023    Patient will be independent with HEP in order to improve functional outcomes. Baseline:  Goal status: MET 5/20  2.  Patient will report at least 25% improvement in symptoms for improved quality of life. Baseline:  Goal status: MET 08/11/23   LONG TERM GOALS: Target date: 09/08/2023    Patient will report at least 75% improvement in symptoms for improved quality of life. Baseline:  Goal status: IN PROGRESS (60% 9/30)  2.  Patient will improve modified oswestry score by at least 8 points in order to indicate improved tolerance to activity. Baseline:  Goal status: MET (improved by 6 points 5/20)  3.  Patient will demonstrate at least 25% improvement in lumbar ROM in all restricted planes for improved ability to move trunk while completing chores and cooking. Baseline:  Goal status: MET  5/20  4.  Patient will be able to complete 5x STS in under 15 seconds in order to demonstrate improved functional mobility/strength Baseline: see above Goal status: In progress - 9/30  5.  Patient will demonstrate grade of 5/5 MMT grade in all tested musculature as evidence of improved strength to assist with stair ambulation and gait.   Baseline:  Goal status: IN PROGRESS 9/30     PLAN:  PT FREQUENCY: 2x/week  PT DURATION: 6 weeks  PLANNED INTERVENTIONS: [id number]- PT Re-evaluation, 97110-Therapeutic exercises, 97530- Therapeutic activity, 97112- Neuromuscular re-education, 97535- Self Care, [id number]- Manual therapy, [phone]- Gait training, [phone]- Orthotic Fit/training, [phone]- Canalith repositioning, [id number]- Aquatic Therapy, [phone]- Splinting, Patient/Family education, Balance training, Stair training, Taping, Dry Needling, Joint mobilization, Joint manipulation, Spinal manipulation, Spinal mobilization, Scar mobilization, and DME instructions.  PLAN FOR NEXT SESSION: f/u with HEP, spinal mobility, core and glute and LE strength, functional strength    Lili Finder, Student-PT 01/09/2024, 10:27 AM  This entire session was performed under direct supervision and direction of a licensed therapist/therapist assistant . I have personally read, edited and approve of the note as written. 12:25 PM, 01/09/24 Prentice CANDIE Stains PT, DPT Physical Therapist at [facility] [address] [place], [place], [address] Phone: [phone]   Fax:  [phone]   Referring diagnosis? M54.50 (ICD-10-CM) - Low back pain, unspecified back pain laterality, unspecified chronicity, unspecified whether sciatica present Treatment diagnosis? (if different than referring diagnosis) m54.59 What was this (referring dx) caused by? []  Surgery []  Fall [x]  Ongoing issue []  Arthritis []  Other: ____________  Laterality: []  Rt []  Lt [x]   Both  Check all possible CPT codes:  *CHOOSE 10 OR LESS*    See Planned Interventions listed in the Plan section of the Evaluation.

## 2024-01-10 ENCOUNTER — Ambulatory Visit

## 2024-01-10 VITALS — Ht 64.0 in | Wt 191.0 lb

## 2024-01-10 DIAGNOSIS — Z0000 Encounter for general adult medical examination without abnormal findings: Secondary | ICD-10-CM

## 2024-01-10 DIAGNOSIS — Z1231 Encounter for screening mammogram for malignant neoplasm of breast: Secondary | ICD-10-CM

## 2024-01-10 NOTE — Progress Notes (Signed)
 Subjective:   [name] is a 73 y.o. who presents for a Medicare Wellness preventive visit.  As a reminder, Annual Wellness Visits don't include a physical exam, and some assessments may be limited, especially if this visit is performed virtually. We may recommend an in-person follow-up visit with your provider if needed.  Visit Complete: Virtual I connected with  [name] on 01/10/24 by a video and audio enabled telemedicine application and verified that I am speaking with the correct person using two identifiers.  Patient Location: Home  Provider Location: Home Office  I discussed the limitations of evaluation and management by telemedicine. The patient expressed understanding and agreed to proceed.  Vital Signs: Because this visit was a virtual/telehealth visit, some criteria may be missing or patient reported. Any vitals not documented were not able to be obtained and vitals that have been documented are patient reported.   Persons Participating in Visit: Patient.  AWV Questionnaire: Yes: Patient Medicare AWV questionnaire was completed by the patient on 01/09/2024; I have confirmed that all information answered by patient is correct and no changes since this date.  Cardiac Risk Factors include: advanced age (>50men, >61 women);diabetes mellitus;Other (see comment)     Objective:    Today's Vitals   01/09/24 1753 01/10/24 1339  Weight:  191 lb (86.6 kg)  Height:  5' 4 (1.626 m)  PainSc: 8     Body mass index is 32.79 kg/m.     01/10/2024    1:51 PM 07/14/2023    8:07 AM 01/05/2023    3:06 PM 10/11/2022    6:43 PM 10/11/2022    1:19 PM 10/04/2022    3:27 PM 07/21/2022    1:00 PM  Advanced Directives  Does Patient Have a Medical Advance Directive? Yes No Yes Yes Yes Yes Yes  Type of Advance Directive Living will  Healthcare Power of [employer] of [place];Living will Healthcare Power of [place];Living will Healthcare Power of [place];Living  will Living will  Does patient want to make changes to medical advance directive?    No - Patient declined  No - Patient declined No - Patient declined  Copy of Healthcare Power of Attorney in Chart? No - copy requested  No - copy requested No - copy requested No - copy requested No - copy requested   Would patient like information on creating a medical advance directive?  No - Patient declined  No - Patient declined       Current Medications (verified) Outpatient Encounter Medications as of 01/10/2024  Medication Sig   acetaminophen  (TYLENOL ) 500 MG tablet Take 1,000 mg by mouth every 6 (six) hours as needed for moderate pain, headache or mild pain.   Alcohol Swabs (B-D SINGLE USE SWABS REGULAR) PADS Use as directed.   aspirin  81 MG tablet Take 81 mg by mouth daily.   azelastine  (ASTELIN ) 0.1 % nasal spray Place 1 spray into both nostrils 2 (two) times daily. Use in each nostril as directed   Blood Glucose Monitoring Suppl (TRUE METRIX AIR GLUCOSE METER) w/Device KIT 1 kit by Does not apply route 3 (three) times daily.   cetirizine  (ZYRTEC ) 10 MG tablet TAKE 1 TABLET BY MOUTH EVERY DAY   COMIRNATY syringe    cyanocobalamin  2000 MCG tablet Take 2,000 mcg by mouth daily.   dapagliflozin  propanediol (FARXIGA ) 10 MG TABS tablet Take 10 mg by mouth daily. (Patient taking differently: Take 10 mg by mouth daily.)   fluticasone  (FLONASE ) 50 MCG/ACT  nasal spray Place 2 sprays into both nostrils daily.   glucose blood (TRUE METRIX BLOOD GLUCOSE TEST) test strip Use as instructed   hydrALAZINE  (APRESOLINE ) 50 MG tablet Take 1 tablet (50 mg total) by mouth 3 (three) times daily. (Patient taking differently: Take 50 mg by mouth 3 (three) times daily. Taking 25 mg up to 3 times a day)   insulin  aspart protamine - aspart (NOVOLOG  MIX 70/30 FLEXPEN) (70-30) 100 UNIT/ML FlexPen Inject 25 Units into the skin 2 (two) times daily with a meal.   levothyroxine  (SYNTHROID ) 100 MCG tablet Take 1 tablet (100 mcg total)  by mouth daily.   losartan  (COZAAR ) 25 MG tablet Take 1 tablet (25 mg total) by mouth daily.   metoCLOPramide  (REGLAN ) 10 MG tablet TAKE 1 TABLET FOUR TIMES DAILY   Polyethyl Glycol-Propyl Glycol (SYSTANE ULTRA) 0.4-0.3 % SOLN Apply 1-2 drops to eye daily as needed (dry eyes).   polyethylene glycol (MIRALAX  / GLYCOLAX ) packet Take 17 g by mouth daily as needed for moderate constipation (MIX AND DRINK).   rosuvastatin  (CRESTOR ) 20 MG tablet TAKE 1 TABLET AT BEDTIME (Patient taking differently: Take 10 mg by mouth at bedtime.)   SUMAtriptan  (IMITREX ) 50 MG tablet TAKE 1 TAB EVERY 2 HRS AS NEEDED FOR MIGRAINE. MAY REPEAT IN 2 HOURS IF HEADACHE PERSISTS OR RECURS. (Patient taking differently: Take 50 mg by mouth every 2 (two) hours as needed for migraine (May repeat in 2 hours if headache persists or recurs.).)   torsemide  (DEMADEX ) 20 MG tablet Take 1 tablet (20 mg total) by mouth 2 (two) times daily.   TRUEplus Lancets 33G MISC Use as directed   alendronate  (FOSAMAX ) 70 MG tablet Take 1 tablet (70 mg total) by mouth every 7 (seven) days. Take with a full glass of water  on an empty stomach. (Patient not taking: Reported on 01/10/2024)   Cholecalciferol  (VITAMIN D ) 50 MCG (2000 UT) tablet Take 2,000 Units by mouth daily. (Patient not taking: Reported on 01/10/2024)   famotidine  (PEPCID ) 40 MG tablet TAKE 1 TABLET BY MOUTH EVERY DAY (Patient not taking: Reported on 01/10/2024)   gabapentin  (NEURONTIN ) 100 MG capsule Take 1 capsule (100 mg total) by mouth 3 (three) times daily. (Patient not taking: Reported on 01/10/2024)   Omega-3 Fatty Acids (OMEGA 3 PO) Take 2 capsules by mouth daily. Omega XL supplement (Patient not taking: Reported on 01/10/2024)   pantoprazole  (PROTONIX ) 40 MG tablet Take 1 tablet (40 mg total) by mouth 2 (two) times daily. (Patient not taking: Reported on 01/10/2024)   traMADol  (ULTRAM ) 50 MG tablet Take 1 tablet (50 mg total) by mouth every 6 (six) hours as needed (mild pain). (Patient  not taking: Reported on 01/10/2024)   No facility-administered encounter medications on file as of 01/10/2024.    Allergies (verified) Dilaudid  [hydromorphone  hcl], Lipitor [atorvastatin ], Adhesive [tape], Ampicillin, Latex, and Penicillins   History: Past Medical History:  Diagnosis Date   Anginal pain 03/19/2012   saw Dr. Calhoun .. she thinks its more related to stomach issues   Arthritis    all over; mostly in my back (03/20/2012)   Breast cancer (HCC)    right (03/20/2012)   Cardiomyopathy    Chest pain 06/2005   Hospitalized, dystolic dysfunction   Chronic lower back pain    have 2 herniated discs; going to have to have a fusion (03/20/2012)   CKD (chronic kidney disease), stage III (HCC)    Family history of anesthesia complication    father has nausea and  vomiting   Gallstones    GERD (gastroesophageal reflux disease)    H/O hiatal hernia    History of kidney stones    History of right mastectomy    Hypertension    patient states never had HTN, takes Hyzaar for heart   Hypothyroidism    Irritable bowel syndrome    Migraines    it's been a long time since I've had one (03/20/2012)   Osteoarthritis    Peripheral neuropathy    PONV (postoperative nausea and vomiting)    Sciatica    Sinus bradycardia    Sleep apnea    mild, no CPAP   Type II diabetes mellitus (HCC)    Past Surgical History:  Procedure Laterality Date   ABDOMINAL HYSTERECTOMY  1993   adenosin cardiolite   07/2005   (+) wall motion abnormality   AXILLARY LYMPH NODE DISSECTION  2003   squamous cell cancer   BACK SURGERY     CARDIAC CATHETERIZATION  ? 2005 / 2007   CHOLECYSTECTOMY N/A 01/24/2018   Procedure: LAPAROSCOPIC CHOLECYSTECTOMY;  Surgeon: Vanderbilt Ned, MD;  Location: [facility] OR;  Service: General;  Laterality: N/A;   CT abdomen and pelvis  02/2010   Same   ESOPHAGOGASTRODUODENOSCOPY  2005   GERD   KNEE ARTHROSCOPY  1990's   right (03/20/2012)   LUMBAR FUSION N/A 08/22/2014    Procedure: Right L2-3 and right L1-2 transforaminal lumbar interbody fusion with pedicle screws, rods, sleeves, cages, local bone graft, Vivigen, cancellous chips;  Surgeon: Lynwood FORBES Better, MD;  Location: [facility] OR;  Service: Orthopedics;  Laterality: N/A;   LUMBAR LAMINECTOMY/DECOMPRESSION MICRODISCECTOMY N/A 02/04/2014   Procedure: RIGHT L2-3 MICRODISCECTOMY ;  Surgeon: Lynwood FORBES Better, MD;  Location: [facility] OR;  Service: Orthopedics;  Laterality: N/A;   MASTECTOMY WITH AXILLARY LYMPH NODE DISSECTION  12/2003   right (03/20/2012)   PARATHYROIDECTOMY Left 10/11/2022   Procedure: MINIMALLY INVASIVE LEFT INFERIOR PARATHYROIDECTOMY;  Surgeon: Rubin Calamity, MD;  Location: [facility] OR;  Service: General;  Laterality: Left;   POSTERIOR CERVICAL FUSION/FORAMINOTOMY  2001   SHOULDER ARTHROSCOPY W/ ROTATOR CUFF REPAIR  2003   Left   THYROIDECTOMY  1977   Right   TOTAL KNEE ARTHROPLASTY  2000   Right   US  of abdomen  07/2005   Fatty liver / kidney stones   Family History  Problem Relation Age of Onset   Hypertension Mother    Breast cancer Mother 46   Diabetes Father    Hypertension Father    Hypertension Sister    Hypertension Brother    Hypertension Brother    Diabetes Brother    Diabetes Brother    Diabetes Other        Family Hx. of Diabetes   Hypertension Other        Family History of HTN   Deep vein thrombosis Daughter    Breast cancer Maternal Aunt    Colon cancer Neg Hx    Esophageal cancer Neg Hx    Liver cancer Neg Hx    Rectal cancer Neg Hx    Stomach cancer Neg Hx    Social History   Socioeconomic History   Marital status: Married    Spouse name: Guillermina   Number of children: 1   Years of education: Not on file   Highest education level: 12th grade  Occupational History   Occupation: Retired since 2003  Tobacco Use   Smoking status: Never   Smokeless tobacco: Never  Vaping Use   Vaping  status: Never Used  Substance and Sexual Activity   Alcohol use: No   Drug use:  No   Sexual activity: Not Currently  Other Topics Concern   Not on file  Social History Narrative   Lives with husband/2025   Social Drivers of Health   Financial Resource Strain: Low Risk  (01/09/2024)   Overall Financial Resource Strain (CARDIA)    Difficulty of Paying Living Expenses: Not very hard  Food Insecurity: No Food Insecurity (01/09/2024)   Hunger Vital Sign    Worried About Running Out of Food in the Last Year: Never true    Ran Out of Food in the Last Year: Never true  Transportation Needs: No Transportation Needs (01/09/2024)   PRAPARE - Administrator, Civil Service (Medical): No    Lack of Transportation (Non-Medical): No  Physical Activity: Inactive (01/10/2024)   Exercise Vital Sign    Days of Exercise per Week: 0 days    Minutes of Exercise per Session: Not on file  Stress: No Stress Concern Present (01/09/2024)   [employer] of Occupational Health - Occupational Stress Questionnaire    Feeling of Stress: Not at all  Social Connections: Socially Integrated (01/09/2024)   Social Connection and Isolation Panel    Frequency of Communication with Friends and Family: More than three times a week    Frequency of Social Gatherings with Friends and Family: Patient declined    Attends Religious Services: More than 4 times per year    Active Member of [employer] or Organizations: Yes    Attends Engineer, structural: More than 4 times per year    Marital Status: Married    Tobacco Counseling Counseling given: Not Answered    Clinical Intake:  Pre-visit preparation completed: Yes  Pain : 0-10 Pain Score: 8  Pain Type: Chronic pain Pain Location: Back Pain Descriptors / Indicators: Aching, Discomfort Pain Onset: More than a month ago Pain Frequency: Constant Pain Relieving Factors: Tylenol   Pain Relieving Factors: Tylenol   BMI - recorded: 32.79 Nutritional Status: BMI > 30  Obese Nutritional Risks: None Diabetes: Yes CBG done?: Yes  (115-per pt) CBG resulted in Enter/ Edit results?: No Did pt. bring in CBG monitor from home?: No  Lab Results  Component Value Date   HGBA1C 7.9 (A) 12/15/2023   HGBA1C 8.7 (A) 08/14/2023   HGBA1C 10.1 (A) 05/12/2023     How often do you need to have someone help you when you read instructions, pamphlets, or other written materials from your doctor or pharmacy?: 3 - Sometimes  Interpreter Needed?: No  Information entered by :: Nyasiah Moffet, RMA   Activities of Daily Living     01/09/2024    5:53 PM  In your present state of health, do you have any difficulty performing the following activities:  Hearing? 1  Comment wears hearing aides  Vision? 0  Difficulty concentrating or making decisions? 0  Walking or climbing stairs? 1  Dressing or bathing? 1  Doing errands, shopping? 1  Preparing Food and eating ? Y  Using the Toilet? N  In the past six months, have you accidently leaked urine? Y  Do you have problems with loss of bowel control? N  Managing your Medications? N  Managing your Finances? N  Housekeeping or managing your Housekeeping? Y    Patient Care Team: Mahlon Comer BRAVO, MD as PCP - General (Family Medicine) Pietro Redell RAMAN, MD as PCP - Cardiology (Cardiology) Lenis Ethelle ORN, MD as  Consulting Physician (Endocrinology) Gershon Donnice SAUNDERS, DPM as Consulting Physician (Podiatry) Neysa Reggy BIRCH, MD as Consulting Physician (Pulmonary Disease) Marcey Elspeth PARAS, MD as Consulting Physician (Ophthalmology) Jerrye Katheryn BROCKS, MD as Consulting Physician (Nephrology) Nicholaus Sherlean CROME, [facility] (Inactive) (Pharmacist) Pa, [facility]  I have updated your Care Teams any recent Medical Services you may have received from other providers in the past year.     Assessment:   This is a routine wellness examination for [place].  Hearing/Vision screen Hearing Screening - Comments:: Wears hearing aides Vision Screening - Comments:: Wears  eyeglasses/ Selinda Reusing   Goals Addressed   None    Depression Screen     01/10/2024    1:54 PM 07/12/2023   12:47 PM 01/05/2023    3:11 PM 10/24/2022   11:07 AM 07/25/2022   10:57 AM 06/14/2022   10:50 AM 03/17/2022   11:04 AM  PHQ 2/9 Scores  PHQ - 2 Score 3 2 2 2  0 1 3  PHQ- 9 Score 6 5 7 7  0 10 6    Fall Risk     01/09/2024    5:53 PM 07/12/2023   12:47 PM 04/13/2023   12:56 PM 01/05/2023    3:03 PM 10/24/2022   11:07 AM  Fall Risk   Falls in the past year? 0 0 1 1 0  Number falls in past yr: 0 0 0 0 0  Injury with Fall? 0 0 1 1 0  Risk for fall due to :  No Fall Risks  Impaired balance/gait No Fall Risks  Follow up    Falls evaluation completed;Education provided;Falls prevention discussed Falls evaluation completed    MEDICARE RISK AT HOME:  Medicare Risk at Home Any stairs in or around the home?: (Patient-Rptd) No Home free of loose throw rugs in walkways, pet beds, electrical cords, etc?: (Patient-Rptd) Yes Adequate lighting in your home to reduce risk of falls?: (Patient-Rptd) Yes Life alert?: (Patient-Rptd) No Use of a cane, walker or w/c?: (Patient-Rptd) Yes Grab bars in the bathroom?: (Patient-Rptd) Yes Shower chair or bench in shower?: (Patient-Rptd) Yes Elevated toilet seat or a handicapped toilet?: (Patient-Rptd) Yes  TIMED UP AND GO:  Was the test performed?  No  Cognitive Function: 6CIT completed        01/10/2024    1:53 PM 01/05/2023    3:12 PM 12/23/2021    2:31 PM 06/26/2019   10:03 AM  6CIT Screen  What Year? 0 points 0 points 0 points 0 points  What month? 0 points 0 points 0 points 0 points  What time? 0 points 0 points 0 points 0 points  Count back from 20 0 points 0 points 0 points 0 points  Months in reverse 2 points 0 points 0 points 0 points  Repeat phrase 4 points 0 points 2 points 0 points  Total Score 6 points 0 points 2 points 0 points    Immunizations Immunization History  Administered Date(s) Administered   Fluad Quad(high Dose  65+) 12/13/2019, 02/01/2021, 01/06/2022   INFLUENZA, HIGH DOSE SEASONAL PF 01/10/2017, 01/02/2023   Influenza Split 01/31/2011   Influenza Whole 02/23/2007   Influenza,inj,Quad PF,6+ Mos 01/07/2014, 02/09/2016, 01/01/2018, 12/03/2018   Influenza-Unspecified 01/10/2012   PFIZER Comirnaty(Gray Top)Covid-19 Tri-Sucrose Vaccine 05/24/2019, 06/18/2019, 01/11/2020, 08/15/2020   PFIZER(Purple Top)SARS-COV-2 Vaccination 05/24/2019, 06/18/2019, 01/11/2020   Pfizer(Comirnaty)Fall Seasonal Vaccine 12 years and older 01/08/2024   Pneumococcal Conjugate-13 05/16/2019   Pneumococcal Polysaccharide-23 08/25/2014   Tdap 04/20/2011, 07/20/2022    Screening  Tests Health Maintenance  Topic Date Due   Hepatitis C Screening  Never done   OPHTHALMOLOGY EXAM  05/05/2023   Colonoscopy  11/22/2023   Medicare Annual Wellness (AWV)  01/05/2024   Mammogram  01/27/2024   Diabetic kidney evaluation - Urine ACR  09/26/2024 (Originally 08/04/2020)   HEMOGLOBIN A1C  06/13/2024   FOOT EXAM  07/11/2024   Diabetic kidney evaluation - eGFR measurement  09/26/2024   Pneumococcal Vaccine: 50+ Years  Completed   Influenza Vaccine  Completed   DEXA SCAN  Completed   HPV VACCINES  Aged Out   Meningococcal B Vaccine  Aged Out   DTaP/Tdap/Td  Discontinued   COVID-19 Vaccine  Discontinued   Zoster Vaccines- Shingrix  Discontinued    Health Maintenance Items Addressed: Mammogram ordered, Labs Due Hep C screening and UACR, See Nurse Notes at the end of this note  Additional Screening:  Vision Screening: Recommended annual ophthalmology exams for early detection of glaucoma and other disorders of the eye. Is the patient up to date with their annual eye exam?  No  Who is the provider or what is the name of the office in which the patient attends annual eye exams? [facility]  Dental Screening: Recommended annual dental exams for proper oral hygiene  Community Resource Referral / Chronic Care Management: CRR required this  visit?  No   CCM required this visit?  No   Plan:    I have personally reviewed and noted the following in the patient's chart:   Medical and social history Use of alcohol, tobacco or illicit drugs  Current medications and supplements including opioid prescriptions. Patient is not currently taking opioid prescriptions. Functional ability and status Nutritional status Physical activity Advanced directives List of other physicians Hospitalizations, surgeries, and ER visits in previous 12 months Vitals Screenings to include cognitive, depression, and falls Referrals and appointments  In addition, I have reviewed and discussed with patient certain preventive protocols, quality metrics, and best practice recommendations. A written personalized care plan for preventive services as well as general preventive health recommendations were provided to patient.   Sabri Teal L Damira Kem, CMA   01/10/2024   After Visit Summary: (MyChart) Due to this being a telephonic visit, the after visit summary with patients personalized plan was offered to patient via MyChart   Notes:Patient is due for a colonoscopy.  She is due for a mammogram and order has been placed today.  Patient stated that she has had a diabetic eye exam for this year.  I have sent a request for eye exam records in today, Dr. Robinson.  She had no other concerns to address today.

## 2024-01-10 NOTE — Patient Instructions (Signed)
 [name],  Thank you for taking the time for your Medicare Wellness Visit. I appreciate your continued commitment to your health goals. Please review the care plan we discussed, and feel free to reach out if I can assist you further.  Medicare recommends these wellness visits once per year to help you and your care team stay ahead of potential health issues. These visits are designed to focus on prevention, allowing your provider to concentrate on managing your acute and chronic conditions during your regular appointments.  Please note that Annual Wellness Visits do not include a physical exam. Some assessments may be limited, especially if the visit was conducted virtually. If needed, we may recommend a separate in-person follow-up with your provider.  Ongoing Care Seeing your primary care provider every 3 to 6 months helps us  monitor your health and provide consistent, personalized care. Last office visit on 12/14/2023.  You are due for a colonoscopy, please call to get scheduled.  You are also due for a kidney evaluation (urine sample), and a Hep C screening, which will be done during your next office visit.    Referrals If a referral was made during today's visit and you haven't received any updates within two weeks, please contact the referred provider directly to check on the status.  You have an order for: [x]   3D Mammogram    Please call for appointment: [facility] [address] [place], [place] [address] [phone]   Make sure to wear two-piece clothing.  No lotions, powders, or deodorants the day of the appointment. Make sure to bring picture ID and insurance card.  Bring list of medications you are currently taking including any supplements.    Recommended Screenings:  Health Maintenance  Topic Date Due   Hepatitis C Screening  Never done   Eye exam for diabetics  05/05/2023   Colon Cancer Screening  11/22/2023   Medicare Annual Wellness Visit   01/05/2024   Breast Cancer Screening  01/27/2024   Yearly kidney health urinalysis for diabetes  09/26/2024*   Hemoglobin A1C  06/13/2024   Complete foot exam   07/11/2024   Yearly kidney function blood test for diabetes  09/26/2024   Pneumococcal Vaccine for age over 62  Completed   Flu Shot  Completed   DEXA scan (bone density measurement)  Completed   HPV Vaccine  Aged Out   Meningitis B Vaccine  Aged Out   DTaP/Tdap/Td vaccine  Discontinued   COVID-19 Vaccine  Discontinued   Zoster (Shingles) Vaccine  Discontinued  *Topic was postponed. The date shown is not the original due date.       01/10/2024    1:51 PM  Advanced Directives  Does Patient Have a Medical Advance Directive? Yes  Type of Advance Directive Living will  Copy of Healthcare Power of Attorney in Chart? No - copy requested   Advance Care Planning is important because it: Ensures you receive medical care that aligns with your values, goals, and preferences. Provides guidance to your family and loved ones, reducing the emotional burden of decision-making during critical moments.  Vision: Annual vision screenings are recommended for early detection of glaucoma, cataracts, and diabetic retinopathy. These exams can also reveal signs of chronic conditions such as diabetes and high blood pressure.  Dental: Annual dental screenings help detect early signs of oral cancer, gum disease, and other conditions linked to overall health, including heart disease and diabetes.  Please see the attached documents for additional preventive care  recommendations.

## 2024-01-11 ENCOUNTER — Ambulatory Visit (HOSPITAL_BASED_OUTPATIENT_CLINIC_OR_DEPARTMENT_OTHER): Attending: Orthopedic Surgery

## 2024-01-11 ENCOUNTER — Encounter (HOSPITAL_BASED_OUTPATIENT_CLINIC_OR_DEPARTMENT_OTHER): Payer: Self-pay

## 2024-01-11 ENCOUNTER — Ambulatory Visit: Admitting: Family Medicine

## 2024-01-11 DIAGNOSIS — M6281 Muscle weakness (generalized): Secondary | ICD-10-CM | POA: Diagnosis not present

## 2024-01-11 DIAGNOSIS — M5459 Other low back pain: Secondary | ICD-10-CM | POA: Insufficient documentation

## 2024-01-11 DIAGNOSIS — R2689 Other abnormalities of gait and mobility: Secondary | ICD-10-CM | POA: Diagnosis not present

## 2024-01-11 NOTE — Therapy (Signed)
 OUTPATIENT PHYSICAL THERAPY THORACOLUMBAR NOTE      Patient Name: [name] MRN: [id number] DOB:14-Jul-1950, 73 y.o., female Today's Date: 01/11/2024    END OF SESSION:   PT End of Session - 01/11/24 1338     Visit Number 33    Number of Visits 48    Date for Recertification  02/20/24    Authorization Type [employer]    Authorization Time Period 18 visits approved from 11/13/2023-01/13/2024    Authorization - Visit Number 10    Authorization - Number of Visits 18    Progress Note Due on Visit 42    PT Start Time 1145    PT Stop Time 1230    PT Time Calculation (min) 45 min    Activity Tolerance Patient tolerated treatment well    Behavior During Therapy [facility] for tasks assessed/performed            Past Medical History:  Diagnosis Date   Anginal pain 03/19/2012   saw Dr. Calhoun .. she thinks its more related to stomach issues   Arthritis    all over; mostly in my back (03/20/2012)   Breast cancer (HCC)    right (03/20/2012)   Cardiomyopathy    Chest pain 06/2005   Hospitalized, dystolic dysfunction   Chronic lower back pain    have 2 herniated discs; going to have to have a fusion (03/20/2012)   CKD (chronic kidney disease), stage III (HCC)    Family history of anesthesia complication    father has nausea and vomiting   Gallstones    GERD (gastroesophageal reflux disease)    H/O hiatal hernia    History of kidney stones    History of right mastectomy    Hypertension    patient states never had HTN, takes Hyzaar for heart   Hypothyroidism    Irritable bowel syndrome    Migraines    it's been a long time since I've had one (03/20/2012)   Osteoarthritis    Peripheral neuropathy    PONV (postoperative nausea and vomiting)    Sciatica    Sinus bradycardia    Sleep apnea    mild, no CPAP   Type II diabetes mellitus (HCC)    Past Surgical History:  Procedure Laterality Date   ABDOMINAL HYSTERECTOMY  1993   adenosin cardiolite    07/2005   (+) wall motion abnormality   AXILLARY LYMPH NODE DISSECTION  2003   squamous cell cancer   BACK SURGERY     CARDIAC CATHETERIZATION  ? 2005 / 2007   CHOLECYSTECTOMY N/A 01/24/2018   Procedure: LAPAROSCOPIC CHOLECYSTECTOMY;  Surgeon: Vanderbilt Ned, MD;  Location: [facility] OR;  Service: General;  Laterality: N/A;   CT abdomen and pelvis  02/2010   Same   ESOPHAGOGASTRODUODENOSCOPY  2005   GERD   KNEE ARTHROSCOPY  1990's   right (03/20/2012)   LUMBAR FUSION N/A 08/22/2014   Procedure: Right L2-3 and right L1-2 transforaminal lumbar interbody fusion with pedicle screws, rods, sleeves, cages, local bone graft, Vivigen, cancellous chips;  Surgeon: Lynwood FORBES Better, MD;  Location: [facility] OR;  Service: Orthopedics;  Laterality: N/A;   LUMBAR LAMINECTOMY/DECOMPRESSION MICRODISCECTOMY N/A 02/04/2014   Procedure: RIGHT L2-3 MICRODISCECTOMY ;  Surgeon: Lynwood FORBES Better, MD;  Location: [facility] OR;  Service: Orthopedics;  Laterality: N/A;   MASTECTOMY WITH AXILLARY LYMPH NODE DISSECTION  12/2003   right (03/20/2012)   PARATHYROIDECTOMY Left 10/11/2022   Procedure: MINIMALLY INVASIVE LEFT INFERIOR PARATHYROIDECTOMY;  Surgeon: Rubin Calamity, MD;  Location: [facility] OR;  Service: General;  Laterality: Left;   POSTERIOR CERVICAL FUSION/FORAMINOTOMY  2001   SHOULDER ARTHROSCOPY W/ ROTATOR CUFF REPAIR  2003   Left   THYROIDECTOMY  1977   Right   TOTAL KNEE ARTHROPLASTY  2000   Right   US  of abdomen  07/2005   Fatty liver / kidney stones   Patient Active Problem List   Diagnosis Date Noted   Class 2 obesity 12/14/2023   Hyperglycemia due to type 2 diabetes mellitus (HCC) 12/14/2023   Urinary incontinence 08/09/2023   Metallic taste 05/11/2023   S/P parathyroidectomy 10/11/2022   Allergic rhinitis 07/25/2022   Syncope 07/21/2022   Sinus bradycardia 07/21/2022   Hammer toe of second toe of left foot 05/05/2021   Corns and callosities 05/05/2021   Hyperlipidemia associated with type 2 diabetes mellitus  (HCC) 02/01/2021   Depression 02/01/2021   Localized osteoporosis without current pathological fracture 05/01/2020   Hyperparathyroidism 12/13/2019   Elevated hemoglobin 12/13/2019   Chronic renal impairment, stage 4 (severe) 10/28/2019   Anemia 10/28/2019   Physical exam 06/26/2019   Neck pain 04/18/2019   Ascending aortic aneurysm 04/17/2019   Nonspecific chest pain 04/15/2019   Neuropathic pain of right flank 12/27/2018   Dysphagia 12/27/2018   Postsurgical hypothyroidism 06/11/2018   Vitamin D  deficiency 06/11/2018   Multinodular goiter 06/11/2018   DM type 2 causing vascular disease (HCC)    Peripheral neuropathy    Migraine    Irritable bowel syndrome    Essential hypertension    History of right mastectomy    H/O hiatal hernia    GERD (gastroesophageal reflux disease)    Family history of anesthesia complication    Other chronic pain    Type 2 diabetes mellitus with diabetic chronic kidney disease (HCC)    Lower extremity edema    Chronic constipation 06/23/2015   Chronic diastolic heart failure (HCC) 11/17/2014   Herniated nucleus pulposus, L2-3 right 02/04/2014    Class: Acute   Spondylolisthesis of lumbar region 04/30/2012    Class: Chronic   Lumbar stenosis with neurogenic claudication 04/30/2012    Class: Chronic   SYNCOPE-CAROTID SINUS 09/16/2008   OSTEOARTHRITIS 08/23/2006   DDD (degenerative disc disease), cervical 08/23/2006   DISC DISEASE, LUMBAR 08/23/2006   Headache(784.0) 08/23/2006   BREAST CANCER, HX OF 08/23/2006   RENAL CALCULUS, HX OF 08/23/2006    PCP: Mahlon Comer BRAVO, MD   REFERRING PROVIDER: Georgina Ozell LABOR, MD  REFERRING DIAG: M54.50 (ICD-10-CM) - Low back pain, unspecified back pain laterality, unspecified chronicity, unspecified whether sciatica present  Rationale for Evaluation and Treatment: Rehabilitation  THERAPY DIAG:  Muscle weakness (generalized)  Other abnormalities of gait and mobility  Other low back  pain  ONSET DATE: chronic   SUBJECTIVE:  SUBJECTIVE STATEMENT: Pt reports she is feeling good today. Was able to stand the other day and make a stew with help fro her daughter.   PERTINENT HISTORY:  Chronic LBP, hx Lumbar surgery, CKD, hx breast cancer, DM, neuropathy, osteoporosis, does have spinal cord stimulator  PAIN:  Are you having pain? Yes: NPRS scale:  4/10 Pain location: low back, abdomen  Pain description: achy Aggravating factors: walking/standing Relieving factors: laying down  PRECAUTIONS: None  WEIGHT BEARING RESTRICTIONS: No  FALLS:  Has patient fallen in last 6 months? No  PLOF: Independent  PATIENT GOALS: stand up and cook   OBJECTIVE: (objective measures from initial evaluation unless otherwise dated)  PATIENT SURVEYS: Modified Oswestry 30/50  5/20: 24/50 10/17/23: 18 / 50 7/24: 11/10/23 30/50 12/14/23: 24/50 (48%) 01/09/24: 25/50  SCREENING FOR RED FLAGS: Bowel or bladder incontinence: No Spinal tumors: No Cauda equina syndrome: No Compression fracture: No Abdominal aneurysm: No  COGNITION: Overall cognitive status: Within functional limits for tasks assessed     SENSATION: WFL  POSTURE: rounded shoulders, forward head, decreased lumbar lordosis, increased thoracic kyphosis, and flexed trunk   PALPATION: TTP lumbar and thoracic paraspinals with greatest tenderness throughout lumbar spine.  LUMBAR ROM:   AROM eval 5/20 11/10/23 12/14/23  Flexion 25% limited Full 0% limited 0% limited   Extension 90% limited * 40% limited 40% limited 40% limited  Right lateral flexion 75% limited * 25% limited* 25% limited* 10%limited  Left lateral flexion 75% limited* 40% limited 25% limited 10% limited  Right rotation 50% limited* 15% limited 0% limited 10% limited  Left  rotation 50% limited* Full 0% limited 10% limited   (Blank rows = not tested) * = pain/symptoms    LOWER EXTREMITY MMT:  unable to test extension as patient unable to get into prone  MMT Right eval Left eval Right 5/20 Left 5/20 Right 10/17/23 Left 10/17/23 Right 11/10/23 Left 11/10/23 Right 12/14/23 Left 12/14/23 Right  9/30 Left  9/30  Hip flexion 4+ 4+ 4+ 4 4+ 4+ 5 4+ 5 4+ 4+ 4+  Hip extension              Hip abduction 4- 3+ 4+ (seated) 4-(seated) 4+ (seated) 4 + (seated) 4+ 4+ 5 5  5   Hip adduction              Hip internal rotation              Hip external rotation              Knee flexion 4+ 4+ 4+ 4 4+ 4+ 5 5 5 5 5 5   Knee extension 4+ 4+ 4+ 4 4+ 4+ 5 5 5 5 5 5   Ankle dorsiflexion 4+ 4+ 5 4+          Ankle plantarflexion              Ankle inversion              Ankle eversion               (Blank rows = not tested) * = pain/symptoms    FUNCTIONAL TESTS:  5 times sit to stand: 27.59 seconds with UE use 08/08/23:  22.5s with UE use 5/20: 20.03s without UE use (lowered plinth) 10/17/23: 24.58 seconds without UE use in chair 11/10/23: 23.89 seconds with intermittent UE use in chair - increased difficulty due to R hip pain 12/14/23: 40.81 with intermittent UE use in chair-L knee pain 01/09/24: 25.37  with no use of UE in chair  GAIT: Distance walked: 100 feet Assistive device utilized: Single point cane Level of assistance: Modified independence Comments: labored, decreased foot clearance  TODAY'S TREATMENT:                                                                                                                              DATE:   01/11/24 Nu step L5 x58min Seated lumbar flexion stretching 4x15sec Supine HSS with strap 3x30sec each (cues to relax neck) Hooklying marching 2x20 Seated clam Blue 2x30 -Seated assisted partial sit up using pink ball 2x10 -PNF chops with pink ball (cues for core engagement) 2x10ea Standing HR 2x10 -standing marching 2x10 Standing HR  2x10 STS from plinth 2x10    01/09/24 NuStep x5 min lvl 5  Reassessment  Discussion of POC, HEP  01/04/24 NuStep x5 min lvl 5  Leg extension machine 10# 3x10 Leg curl machine 3x20 at 10# (half reps) Lumbar ball roll outs 2x10 Supine HS stretch with strap 2x30 second bilaterally   01/02/24: Nu-step L5 x31min Seated lumbar flexion stretch 6x10sec -manual HS and piriformis stretching R LE -hooklying marching with TrA 2x10 -Seated clam with GTB 2x30 -Hooklying adductor sqz 5 2x10 -Seated assisted partial sit up using pink ball 2x10 -PNF chops with pink ball (cues for core engagement) 2x10ea  12/28/23: Nu-step L5 xmin Seated lumbar flexion stretch 4x10sec -manual HS and ITB stretching R LE -seated marching with TrA 2x20 -DKTC with LES on physioball x88min -Seated clam with GTB x30 -Hooklying adductor sqz 5 2x10 -Seated assisted partial sit up using pink ball 2x10 -PNF chops with pink ball (cues for core engagement) 2x10ea -STS from elevated plinth 2x10 -resisted side stepping at rail-RTB at ankles- x1 lap  12/26/23: Nu-step L5 xmin Seated lumbar flexion stretch 4x10sec -LTR x20ea -manual HS and ITB stretching R LE -seated marching with TrA 2x20 -DKTC with LES on physioball x38min -Seated clam with GTB x30 -Hooklying adductor sqz 5 2x10 -Seated assisted partial sit up using pink ball 2x10 -PNF chops with pink ball (cues for core engagement) x10ea -STS from elevated plinth 2x10 -resisted side stepping at rail-RTB at ankles- x1 lap  12/19/23 Seated trunk flexion stretch 5 x 10 second holds Standing hip abduction RTB at knees 2 x 10  Standing hip extension RTB at knees 2 x 10  Seated Row RTB 3 x 15 Seated shoulder extension RTB 3 x 15 Palof press 1 x 10  12/14/23 Nu step L3 x38min MODI Updated MMT and ROM 5xSTS Review of goals Sit to stands 2x10 (cues for technique)  11/21/23 NuStep L5, UE/LE x 6 min Standing hip abduction RTB at knees 2 x 10  Standing hip  extension RTB at knees 3 x 10  Seated trunk flexion stretch 5 x 10 second holds Lateral step up and over 2 x 10 Seated Row RTB 3 x 15 Seated shoulder extension RTB 2 x 20 Seated ab iso with  green ball 2 x 10 with 5 second holds STS from elevated plinth 1x10  11/15/23 -NuStep L5, UE/LE x 5 min  -SKTC 30sec x3ea -LTR x20ea -manual HS and ITB stretching R LE -supine marching with TrA 2x20 -Hooklying oblique reaches 2x10ea -DKTC with LES on physioball x62min -Seated clam with GTB 2x30 -Seated adductor sqz 5 2x10 -Seated assisted partial sit up using pink ball 2x10 -Seated fwd flexion stretch 10 x5 to each side  -PNF chops with pink ball (cues for core engagement) 2x10ea -STS from elevated plinth 2x10   11/13/23 -NuStep L5, UE/LE x 5 min  -SKTC 30sec x3ea -LTR x10ea -supine marching with TrA 2x20 -Hooklying oblique reaches 2x10ea -DKTC with LES on physioball x68min -Seated clam with GTB x30 -Seated adductor sqz 5 2x10 -Seated assisted partial sit up using pink ball 2x10 -Seated fwd flexion stretch 10 x5  -PNF chops with pink ball (cues for core engagement) 2x10ea -STS from elevated plinth 2x10      PATIENT EDUCATION:  Education details: exercise rationale, progression/modification 11/10/23 reassessment findings, POC Person educated: Patient Education method: Explanation, Demonstration, Education comprehension: verbalized understanding, returned demonstration, verbal cues required, and tactile cues required  HOME EXERCISE PROGRAM: Access Code: [facility] URL: [email or web address]   ASSESSMENT: CLINICAL IMPRESSION:  Pt demonstrates improving tolerance for PT progressions. Good activation in core felt with PNF chops and partial sit ups. She denied increased pain throughout session. Will continue to progress as tolerated with strengthening interventions for core and Les.      OBJECTIVE IMPAIRMENTS: Abnormal gait, decreased activity tolerance, decreased  balance, decreased endurance, decreased mobility, difficulty walking, decreased ROM, decreased strength, increased muscle spasms, impaired flexibility, impaired sensation, improper body mechanics, postural dysfunction, and pain.   ACTIVITY LIMITATIONS: carrying, lifting, bending, sitting, standing, squatting, stairs, transfers, reach over head, hygiene/grooming, locomotion level, and caring for others  PARTICIPATION LIMITATIONS: meal prep, cleaning, laundry, shopping, community activity, and yard work  PERSONAL FACTORS: Fitness, Time since onset of injury/illness/exacerbation, and 3+ comorbidities: Chronic LBP, hx Lumbar surgery, CKD, hx breast cancer, DM, neuropathy, osteoporosis  are also affecting patient's functional outcome.   REHAB POTENTIAL: Good  CLINICAL DECISION MAKING: Evolving/moderate complexity  EVALUATION COMPLEXITY: Moderate   GOALS: Goals reviewed with patient? Yes  SHORT TERM GOALS: Target date: 08/11/2023    Patient will be independent with HEP in order to improve functional outcomes. Baseline:  Goal status: MET 5/20  2.  Patient will report at least 25% improvement in symptoms for improved quality of life. Baseline:  Goal status: MET 08/11/23   LONG TERM GOALS: Target date: 09/08/2023    Patient will report at least 75% improvement in symptoms for improved quality of life. Baseline:  Goal status: IN PROGRESS (60% 9/30)  2.  Patient will improve modified oswestry score by at least 8 points in order to indicate improved tolerance to activity. Baseline:  Goal status: MET (improved by 6 points 5/20)  3.  Patient will demonstrate at least 25% improvement in lumbar ROM in all restricted planes for improved ability to move trunk while completing chores and cooking. Baseline:  Goal status: MET 5/20  4.  Patient will be able to complete 5x STS in under 15 seconds in order to demonstrate improved functional mobility/strength Baseline: see above Goal status: In  progress - 9/30  5.  Patient will demonstrate grade of 5/5 MMT grade in all tested musculature as evidence of improved strength to assist with stair ambulation and gait.   Baseline:  Goal status: IN PROGRESS  9/30     PLAN:  PT FREQUENCY: 2x/week  PT DURATION: 6 weeks  PLANNED INTERVENTIONS: 97164- PT Re-evaluation, 97110-Therapeutic exercises, 97530- Therapeutic activity, 97112- Neuromuscular re-education, 97535- Self Care, [id number]- Manual therapy, [phone]- Gait training, [phone]- Orthotic Fit/training, [phone]- Canalith repositioning, [id number]- Aquatic Therapy, [phone]- Splinting, Patient/Family education, Balance training, Stair training, Taping, Dry Needling, Joint mobilization, Joint manipulation, Spinal manipulation, Spinal mobilization, Scar mobilization, and DME instructions.  PLAN FOR NEXT SESSION: f/u with HEP, spinal mobility, core and glute and LE strength, functional strength    Asberry FORBES Rodes, PTA 01/11/2024, 5:05 PM      [facility] [address] [place], [place], [address] Phone: [phone]   Fax:  [phone]   Referring diagnosis? M54.50 (ICD-10-CM) - Low back pain, unspecified back pain laterality, unspecified chronicity, unspecified whether sciatica present Treatment diagnosis? (if different than referring diagnosis) m54.59 What was this (referring dx) caused by? []  Surgery []  Fall [x]  Ongoing issue []  Arthritis []  Other: ____________  Laterality: []  Rt []  Lt [x]  Both  Check all possible CPT codes:  *CHOOSE 10 OR LESS*    See Planned Interventions listed in the Plan section of the Evaluation.

## 2024-01-22 ENCOUNTER — Ambulatory Visit: Admitting: Family Medicine

## 2024-01-22 ENCOUNTER — Encounter: Payer: Self-pay | Admitting: Family Medicine

## 2024-01-22 VITALS — BP 128/78 | HR 78 | Temp 97.3°F | Ht 64.0 in | Wt 187.4 lb

## 2024-01-22 DIAGNOSIS — E785 Hyperlipidemia, unspecified: Secondary | ICD-10-CM | POA: Diagnosis not present

## 2024-01-22 DIAGNOSIS — Z1159 Encounter for screening for other viral diseases: Secondary | ICD-10-CM

## 2024-01-22 DIAGNOSIS — E1169 Type 2 diabetes mellitus with other specified complication: Secondary | ICD-10-CM

## 2024-01-22 DIAGNOSIS — K146 Glossodynia: Secondary | ICD-10-CM

## 2024-01-22 DIAGNOSIS — I10 Essential (primary) hypertension: Secondary | ICD-10-CM | POA: Diagnosis not present

## 2024-01-22 DIAGNOSIS — E890 Postprocedural hypothyroidism: Secondary | ICD-10-CM

## 2024-01-22 MED ORDER — ROSUVASTATIN CALCIUM 20 MG PO TABS
20.0000 mg | ORAL_TABLET | Freq: Every day | ORAL | Status: AC
Start: 2024-01-22 — End: ?

## 2024-01-22 MED ORDER — CLONAZEPAM 0.5 MG PO TABS
0.5000 mg | ORAL_TABLET | Freq: Two times a day (BID) | ORAL | 3 refills | Status: AC
Start: 2024-01-22 — End: ?

## 2024-01-22 NOTE — Assessment & Plan Note (Signed)
 New.  Pt previously discussed a bad taste in her mouth but now struggling w/ burning tongue as well as dry mouth and bad taste.  She is having a hard time eating due to discomfort and pain.  Will start low dose Clonazepam and monitor for symptom improvement as this has been the only medical tx w/ clinical data to support its use.  Pt expressed understanding and is in agreement w/ plan.

## 2024-01-22 NOTE — Progress Notes (Signed)
   Subjective:    Patient ID: [name], female    DOB: December 11, 1950, 73 y.o.   MRN: [id number]  HPI Hyperlipidemia- ongoing issue.  Pt is prescribed Crestor  20mg  daily but nephrology decreased to 1/2 tab.  No abd pain, N/V.  HTN- chronic problem, on Losartan  25mg  daily, Hydralazine  50mg  TID w/ good control.  No CP, SOB, HA's, visual changes, edema.  Hypothyroid- chronic problem, on Levothyroxine  100mcg daily.  Burning mouth- 'i eat b/c I have to eat.  It tastes nasty'.  Pt reports tongue is burning- particularly when eating.  Meat and bread cause the most severe sxs.  + metallic taste.   Review of Systems For ROS see HPI     Objective:   Physical Exam Vitals reviewed.  Constitutional:      General: She is not in acute distress.    Appearance: She is well-developed. She is obese. She is not ill-appearing.  HENT:     Head: Normocephalic and atraumatic.     Mouth/Throat:     Mouth: Mucous membranes are moist.     Pharynx: No oropharyngeal exudate or posterior oropharyngeal erythema.  Eyes:     Conjunctiva/sclera: Conjunctivae normal.     Pupils: Pupils are equal, round, and reactive to light.  Neck:     Thyroid : No thyromegaly.  Cardiovascular:     Rate and Rhythm: Normal rate and regular rhythm.     Heart sounds: Normal heart sounds. No murmur heard. Pulmonary:     Effort: Pulmonary effort is normal. No respiratory distress.     Breath sounds: Normal breath sounds.  Abdominal:     General: There is no distension.     Palpations: Abdomen is soft.     Tenderness: There is no abdominal tenderness.  Musculoskeletal:     Cervical back: Normal range of motion and neck supple.  Lymphadenopathy:     Cervical: No cervical adenopathy.  Skin:    General: Skin is warm and dry.  Neurological:     Mental Status: She is alert and oriented to person, place, and time. Mental status is at baseline.  Psychiatric:        Mood and Affect: Mood normal.        Behavior: Behavior  normal.        Thought Content: Thought content normal.           Assessment & Plan:

## 2024-01-22 NOTE — Assessment & Plan Note (Signed)
Chronic problem.  Currently asymptomatic.  Check labs.  Adjust meds prn  

## 2024-01-22 NOTE — Assessment & Plan Note (Signed)
 Chronic problem.  Currently on Losartan  25mg  daily and Hydralazine  50mg  TID w/ good control.  Currently asymptomatic.  Check labs due to ARB and diuretic use.  Will follow.

## 2024-01-22 NOTE — Assessment & Plan Note (Signed)
 Chronic problem.  On Crestor  10mg  after nephrology reduced her dose.  Check labs.  Adjust meds prn

## 2024-01-22 NOTE — Patient Instructions (Signed)
 Follow up in 2 months to recheck burning mouth We'll notify you of your lab results and make any changes if needed START the Clonazepam twice daily to help w/ your burning mouth Continue to drink LOTS of water  Call with any questions or concerns Stay Safe!  Stay Healthy! Hang in there!

## 2024-01-23 LAB — LIPID PANEL
Cholesterol: 150 mg/dL (ref 0–200)
HDL: 42.2 mg/dL (ref >39.00)
LDL Cholesterol: 67 mg/dL (ref 0–99)
NonHDL: 107.87
Total CHOL/HDL Ratio: 4
Triglycerides: 202 mg/dL — ABNORMAL HIGH (ref 0.0–149.0)
VLDL: 40.4 mg/dL — ABNORMAL HIGH (ref 0.0–40.0)

## 2024-01-23 LAB — HEPATIC FUNCTION PANEL
ALT: 7 U/L (ref 0–35)
AST: 13 U/L (ref 0–37)
Albumin: 3.6 g/dL (ref 3.5–5.2)
Alkaline Phosphatase: 43 U/L (ref 39–117)
Bilirubin, Direct: 0 mg/dL (ref 0.0–0.3)
Total Bilirubin: 0.5 mg/dL (ref 0.2–1.2)
Total Protein: 7.4 g/dL (ref 6.0–8.3)

## 2024-01-23 LAB — BASIC METABOLIC PANEL WITH GFR
BUN: 30 mg/dL — ABNORMAL HIGH (ref 6–23)
CO2: 24 meq/L (ref 19–32)
Calcium: 8.6 mg/dL (ref 8.4–10.5)
Chloride: 108 meq/L (ref 96–112)
Creatinine, Ser: 2.36 mg/dL — ABNORMAL HIGH (ref 0.40–1.20)
GFR: 19.69 mL/min — ABNORMAL LOW (ref >60.00)
Glucose, Bld: 153 mg/dL — ABNORMAL HIGH (ref 70–99)
Potassium: 4.6 meq/L (ref 3.5–5.1)
Sodium: 140 meq/L (ref 135–145)

## 2024-01-23 LAB — B12 AND FOLATE PANEL
Folate: 13 ng/mL (ref >5.9)
Vitamin B-12: 229 pg/mL (ref 211–911)

## 2024-01-23 LAB — CBC WITH DIFFERENTIAL/PLATELET
Basophils Absolute: 0.1 K/uL (ref 0.0–0.1)
Basophils Relative: 1.7 % (ref 0.0–3.0)
Eosinophils Absolute: 0.3 K/uL (ref 0.0–0.7)
Eosinophils Relative: 3.7 % (ref 0.0–5.0)
HCT: 37.6 % (ref 36.0–46.0)
Hemoglobin: 12 g/dL (ref 12.0–15.0)
Lymphocytes Relative: 33.9 % (ref 12.0–46.0)
Lymphs Abs: 2.5 K/uL (ref 0.7–4.0)
MCHC: 31.9 g/dL (ref 30.0–36.0)
MCV: 89.4 fl (ref 78.0–100.0)
Monocytes Absolute: 0.6 K/uL (ref 0.1–1.0)
Monocytes Relative: 8.4 % (ref 3.0–12.0)
Neutro Abs: 3.8 K/uL (ref 1.4–7.7)
Neutrophils Relative %: 52.3 % (ref 43.0–77.0)
Platelets: 305 K/uL (ref 150.0–400.0)
RBC: 4.2 Mil/uL (ref 3.87–5.11)
RDW: 15.7 % — ABNORMAL HIGH (ref 11.5–15.5)
WBC: 7.3 K/uL (ref 4.0–10.5)

## 2024-01-23 LAB — MICROALBUMIN / CREATININE URINE RATIO
Creatinine,U: 111.8 mg/dL
Microalb Creat Ratio: 3239.3 mg/g — ABNORMAL HIGH (ref 0.0–30.0)
Microalb, Ur: 362.3 mg/dL — ABNORMAL HIGH (ref 0.0–1.9)

## 2024-01-23 LAB — TSH: TSH: 1.39 u[IU]/mL (ref 0.35–5.50)

## 2024-01-24 ENCOUNTER — Ambulatory Visit: Payer: Self-pay | Admitting: Family Medicine

## 2024-01-24 DIAGNOSIS — E1165 Type 2 diabetes mellitus with hyperglycemia: Secondary | ICD-10-CM | POA: Diagnosis not present

## 2024-01-24 NOTE — Progress Notes (Signed)
Called pt no vm to leave a message. ?

## 2024-01-25 NOTE — Progress Notes (Signed)
 Pt has been notified.

## 2024-01-30 NOTE — Therapy (Unsigned)
 OUTPATIENT PHYSICAL THERAPY THORACOLUMBAR NOTE      Patient Name: [name] MRN: [id number] DOB:10-06-1950, 73 y.o., female Today's Date: 01/31/2024    END OF SESSION:   PT End of Session - 01/31/24 1058     Visit Number 34    Number of Visits 48    Date for Recertification  02/20/24    Authorization Type [employer]IC[employer]    Authorization Time Period 18 visits approved from 11/13/2023-01/13/2024    Progress Note Due on Visit 42    PT Start Time 1058    Activity Tolerance Patient tolerated treatment well    Behavior During Therapy [facility] for tasks assessed/performed             Past Medical History:  Diagnosis Date   Anginal pain 03/19/2012   saw Dr. Calhoun .. she thinks its more related to stomach issues   Arthritis    all over; mostly in my back (03/20/2012)   Breast cancer (HCC)    right (03/20/2012)   Cardiomyopathy    Chest pain 06/2005   Hospitalized, dystolic dysfunction   Chronic lower back pain    have 2 herniated discs; going to have to have a fusion (03/20/2012)   CKD (chronic kidney disease), stage III (HCC)    Family history of anesthesia complication    father has nausea and vomiting   Gallstones    GERD (gastroesophageal reflux disease)    H/O hiatal hernia    History of kidney stones    History of right mastectomy    Hypertension    patient states never had HTN, takes Hyzaar for heart   Hypothyroidism    Irritable bowel syndrome    Migraines    it's been a long time since I've had one (03/20/2012)   Osteoarthritis    Peripheral neuropathy    PONV (postoperative nausea and vomiting)    Sciatica    Sinus bradycardia    Sleep apnea    mild, no CPAP   Type II diabetes mellitus (HCC)    Past Surgical History:  Procedure Laterality Date   ABDOMINAL HYSTERECTOMY  1993   adenosin cardiolite   07/2005   (+) wall motion abnormality   AXILLARY LYMPH NODE DISSECTION  2003   squamous cell cancer   BACK SURGERY     CARDIAC  CATHETERIZATION  ? 2005 / 2007   CHOLECYSTECTOMY N/A 01/24/2018   Procedure: LAPAROSCOPIC CHOLECYSTECTOMY;  Surgeon: Vanderbilt Ned, MD;  Location: [facility] OR;  Service: General;  Laterality: N/A;   CT abdomen and pelvis  02/2010   Same   ESOPHAGOGASTRODUODENOSCOPY  2005   GERD   KNEE ARTHROSCOPY  1990's   right (03/20/2012)   LUMBAR FUSION N/A 08/22/2014   Procedure: Right L2-3 and right L1-2 transforaminal lumbar interbody fusion with pedicle screws, rods, sleeves, cages, local bone graft, Vivigen, cancellous chips;  Surgeon: Lynwood FORBES Better, MD;  Location: [facility] OR;  Service: Orthopedics;  Laterality: N/A;   LUMBAR LAMINECTOMY/DECOMPRESSION MICRODISCECTOMY N/A 02/04/2014   Procedure: RIGHT L2-3 MICRODISCECTOMY ;  Surgeon: Lynwood FORBES Better, MD;  Location: [facility] OR;  Service: Orthopedics;  Laterality: N/A;   MASTECTOMY WITH AXILLARY LYMPH NODE DISSECTION  12/2003   right (03/20/2012)   PARATHYROIDECTOMY Left 10/11/2022   Procedure: MINIMALLY INVASIVE LEFT INFERIOR PARATHYROIDECTOMY;  Surgeon: Rubin Calamity, MD;  Location: [facility] OR;  Service: General;  Laterality: Left;   POSTERIOR CERVICAL FUSION/FORAMINOTOMY  2001   SHOULDER ARTHROSCOPY W/ ROTATOR CUFF REPAIR  2003   Left  THYROIDECTOMY  1977   Right   TOTAL KNEE ARTHROPLASTY  2000   Right   US  of abdomen  07/2005   Fatty liver / kidney stones   Patient Active Problem List   Diagnosis Date Noted   Burning mouth syndrome 01/22/2024   Class 2 obesity 12/14/2023   Hyperglycemia due to type 2 diabetes mellitus (HCC) 12/14/2023   Urinary incontinence 08/09/2023   Metallic taste 05/11/2023   S/P parathyroidectomy 10/11/2022   Allergic rhinitis 07/25/2022   Syncope 07/21/2022   Sinus bradycardia 07/21/2022   Hammer toe of second toe of left foot 05/05/2021   Corns and callosities 05/05/2021   Hyperlipidemia associated with type 2 diabetes mellitus (HCC) 02/01/2021   Depression 02/01/2021   Localized osteoporosis without current pathological  fracture 05/01/2020   Hyperparathyroidism 12/13/2019   Elevated hemoglobin 12/13/2019   Chronic renal impairment, stage 4 (severe) 10/28/2019   Anemia 10/28/2019   Physical exam 06/26/2019   Neck pain 04/18/2019   Ascending aortic aneurysm 04/17/2019   Nonspecific chest pain 04/15/2019   Neuropathic pain of right flank 12/27/2018   Dysphagia 12/27/2018   Postsurgical hypothyroidism 06/11/2018   Vitamin D  deficiency 06/11/2018   Multinodular goiter 06/11/2018   DM type 2 causing vascular disease (HCC)    Peripheral neuropathy    Migraine    Irritable bowel syndrome    Essential hypertension    History of right mastectomy    H/O hiatal hernia    GERD (gastroesophageal reflux disease)    Family history of anesthesia complication    Other chronic pain    Type 2 diabetes mellitus with diabetic chronic kidney disease (HCC)    Lower extremity edema    Chronic constipation 06/23/2015   Chronic diastolic heart failure (HCC) 11/17/2014   Herniated nucleus pulposus, L2-3 right 02/04/2014    Class: Acute   Spondylolisthesis of lumbar region 04/30/2012    Class: Chronic   Lumbar stenosis with neurogenic claudication 04/30/2012    Class: Chronic   SYNCOPE-CAROTID SINUS 09/16/2008   OSTEOARTHRITIS 08/23/2006   DDD (degenerative disc disease), cervical 08/23/2006   DISC DISEASE, LUMBAR 08/23/2006   Headache(784.0) 08/23/2006   BREAST CANCER, HX OF 08/23/2006   RENAL CALCULUS, HX OF 08/23/2006    PCP: Mahlon Comer BRAVO, MD   REFERRING PROVIDER: Georgina Ozell LABOR, MD  REFERRING DIAG: M54.50 (ICD-10-CM) - Low back pain, unspecified back pain laterality, unspecified chronicity, unspecified whether sciatica present  Rationale for Evaluation and Treatment: Rehabilitation  THERAPY DIAG:  Muscle weakness (generalized)  Other abnormalities of gait and mobility  Other low back pain  ONSET DATE: chronic   SUBJECTIVE:  SUBJECTIVE STATEMENT: Pt reports she is feeling good today. Was able to stand the other day and make a stew with help fro her daughter.   PERTINENT HISTORY:  Chronic LBP, hx Lumbar surgery, CKD, hx breast cancer, DM, neuropathy, osteoporosis, does have spinal cord stimulator  PAIN:  Are you having pain? Yes: NPRS scale:  4/10 Pain location: low back, abdomen  Pain description: achy Aggravating factors: walking/standing Relieving factors: laying down  PRECAUTIONS: None  WEIGHT BEARING RESTRICTIONS: No  FALLS:  Has patient fallen in last 6 months? No  PLOF: Independent  PATIENT GOALS: stand up and cook   OBJECTIVE: (objective measures from initial evaluation unless otherwise dated)  PATIENT SURVEYS: Modified Oswestry 30/50  5/20: 24/50 10/17/23: 18 / 50 7/24: 11/10/23 30/50 12/14/23: 24/50 (48%) 01/09/24: 25/50  SCREENING FOR RED FLAGS: Bowel or bladder incontinence: No Spinal tumors: No Cauda equina syndrome: No Compression fracture: No Abdominal aneurysm: No  COGNITION: Overall cognitive status: Within functional limits for tasks assessed     SENSATION: WFL  POSTURE: rounded shoulders, forward head, decreased lumbar lordosis, increased thoracic kyphosis, and flexed trunk   PALPATION: TTP lumbar and thoracic paraspinals with greatest tenderness throughout lumbar spine.  LUMBAR ROM:   AROM eval 5/20 11/10/23 12/14/23  Flexion 25% limited Full 0% limited 0% limited   Extension 90% limited * 40% limited 40% limited 40% limited  Right lateral flexion 75% limited * 25% limited* 25% limited* 10%limited  Left lateral flexion 75% limited* 40% limited 25% limited 10% limited  Right rotation 50% limited* 15% limited 0% limited 10% limited  Left rotation 50% limited* Full 0% limited 10% limited   (Blank rows = not tested) * = pain/symptoms    LOWER  EXTREMITY MMT:  unable to test extension as patient unable to get into prone  MMT Right eval Left eval Right 5/20 Left 5/20 Right 10/17/23 Left 10/17/23 Right 11/10/23 Left 11/10/23 Right 12/14/23 Left 12/14/23 Right  9/30 Left  9/30  Hip flexion 4+ 4+ 4+ 4 4+ 4+ 5 4+ 5 4+ 4+ 4+  Hip extension              Hip abduction 4- 3+ 4+ (seated) 4-(seated) 4+ (seated) 4 + (seated) 4+ 4+ 5 5  5   Hip adduction              Hip internal rotation              Hip external rotation              Knee flexion 4+ 4+ 4+ 4 4+ 4+ 5 5 5 5 5 5   Knee extension 4+ 4+ 4+ 4 4+ 4+ 5 5 5 5 5 5   Ankle dorsiflexion 4+ 4+ 5 4+          Ankle plantarflexion              Ankle inversion              Ankle eversion               (Blank rows = not tested) * = pain/symptoms    FUNCTIONAL TESTS:  5 times sit to stand: 27.59 seconds with UE use 08/08/23:  22.5s with UE use 5/20: 20.03s without UE use (lowered plinth) 10/17/23: 24.58 seconds without UE use in chair 11/10/23: 23.89 seconds with intermittent UE use in chair - increased difficulty due to R hip pain 12/14/23: 40.81 with intermittent UE use in chair-L knee pain 01/09/24: 25.37  with no use of UE in chair  GAIT: Distance walked: 100 feet Assistive device utilized: Single point cane Level of assistance: Modified independence Comments: labored, decreased foot clearance  TODAY'S TREATMENT:                                                                                                                              DATE:  01/31/24 Nu step L5 x76min Seated clam Blue 2x30 PNF chops with pink ball (cues for core engagement)  Bilat ER with RTB  STS from plinth 2x10 Step up marches 2 2x  Step taps 4 inch step 3x20 Lateral step ups 4 inch step 2x10 Ball reaches with white weighted ball.   01/11/24 Nu step L5 x58min Seated lumbar flexion stretching 4x15sec Supine HSS with strap 3x30sec each (cues to relax neck) Hooklying marching 2x20 Seated clam Blue 2x30 -Seated  assisted partial sit up using pink ball 2x10 -PNF chops with pink ball (cues for core engagement) 2x10ea Standing HR 2x10 -standing marching 2x10 Standing HR 2x10 STS from plinth 2x10   01/09/24 NuStep x5 min lvl 5  Reassessment  Discussion of POC, HEP  01/04/24 NuStep x5 min lvl 5  Leg extension machine 10# 3x10 Leg curl machine 3x20 at 10# (half reps) Lumbar ball roll outs 2x10 Supine HS stretch with strap 2x30 second bilaterally   01/02/24: Nu-step L5 x88min Seated lumbar flexion stretch 6x10sec -manual HS and piriformis stretching R LE -hooklying marching with TrA 2x10 -Seated clam with GTB 2x30 -Hooklying adductor sqz 5 2x10 -Seated assisted partial sit up using pink ball 2x10 -PNF chops with pink ball (cues for core engagement) 2x10ea  12/28/23: Nu-step L5 xmin Seated lumbar flexion stretch 4x10sec -manual HS and ITB stretching R LE -seated marching with TrA 2x20 -DKTC with LES on physioball x43min -Seated clam with GTB x30 -Hooklying adductor sqz 5 2x10 -Seated assisted partial sit up using pink ball 2x10 -PNF chops with pink ball (cues for core engagement) 2x10ea -STS from elevated plinth 2x10 -resisted side stepping at rail-RTB at ankles- x1 lap  12/26/23: Nu-step L5 xmin Seated lumbar flexion stretch 4x10sec -LTR x20ea -manual HS and ITB stretching R LE -seated marching with TrA 2x20 -DKTC with LES on physioball x78min -Seated clam with GTB x30 -Hooklying adductor sqz 5 2x10 -Seated assisted partial sit up using pink ball 2x10 -PNF chops with pink ball (cues for core engagement) x10ea -STS from elevated plinth 2x10 -resisted side stepping at rail-RTB at ankles- x1 lap  12/19/23 Seated trunk flexion stretch 5 x 10 second holds Standing hip abduction RTB at knees 2 x 10  Standing hip extension RTB at knees 2 x 10  Seated Row RTB 3 x 15 Seated shoulder extension RTB 3 x 15 Palof press 1 x 10  12/14/23 Nu step L3 x8min MODI Updated MMT and  ROM 5xSTS Review of goals Sit to stands 2x10 (cues for technique)  11/21/23 NuStep L5, UE/LE x 6 min Standing  hip abduction RTB at knees 2 x 10  Standing hip extension RTB at knees 3 x 10  Seated trunk flexion stretch 5 x 10 second holds Lateral step up and over 2 x 10 Seated Row RTB 3 x 15 Seated shoulder extension RTB 2 x 20 Seated ab iso with green ball 2 x 10 with 5 second holds STS from elevated plinth 1x10  11/15/23 -NuStep L5, UE/LE x 5 min  -SKTC 30sec x3ea -LTR x20ea -manual HS and ITB stretching R LE -supine marching with TrA 2x20 -Hooklying oblique reaches 2x10ea -DKTC with LES on physioball x63min -Seated clam with GTB 2x30 -Seated adductor sqz 5 2x10 -Seated assisted partial sit up using pink ball 2x10 -Seated fwd flexion stretch 10 x5 to each side  -PNF chops with pink ball (cues for core engagement) 2x10ea -STS from elevated plinth 2x10   11/13/23 -NuStep L5, UE/LE x 5 min  -SKTC 30sec x3ea -LTR x10ea -supine marching with TrA 2x20 -Hooklying oblique reaches 2x10ea -DKTC with LES on physioball x55min -Seated clam with GTB x30 -Seated adductor sqz 5 2x10 -Seated assisted partial sit up using pink ball 2x10 -Seated fwd flexion stretch 10 x5  -PNF chops with pink ball (cues for core engagement) 2x10ea -STS from elevated plinth 2x10      PATIENT EDUCATION:  Education details: exercise rationale, progression/modification 11/10/23 reassessment findings, POC Person educated: Patient Education method: Explanation, Demonstration, Education comprehension: verbalized understanding, returned demonstration, verbal cues required, and tactile cues required  HOME EXERCISE PROGRAM: Access Code: [facility] URL: [email or web address]   ASSESSMENT: CLINICAL IMPRESSION:  Tolerated exercise well with no reported pain. Session with focus on strength, balance and endurance today. Pt tolerated a lot of standing activity with minimal seated rest breaks.  Will continue to benefit from therapy to address remaining limitations.     OBJECTIVE IMPAIRMENTS: Abnormal gait, decreased activity tolerance, decreased balance, decreased endurance, decreased mobility, difficulty walking, decreased ROM, decreased strength, increased muscle spasms, impaired flexibility, impaired sensation, improper body mechanics, postural dysfunction, and pain.   ACTIVITY LIMITATIONS: carrying, lifting, bending, sitting, standing, squatting, stairs, transfers, reach over head, hygiene/grooming, locomotion level, and caring for others  PARTICIPATION LIMITATIONS: meal prep, cleaning, laundry, shopping, community activity, and yard work  PERSONAL FACTORS: Fitness, Time since onset of injury/illness/exacerbation, and 3+ comorbidities: Chronic LBP, hx Lumbar surgery, CKD, hx breast cancer, DM, neuropathy, osteoporosis  are also affecting patient's functional outcome.   REHAB POTENTIAL: Good  CLINICAL DECISION MAKING: Evolving/moderate complexity  EVALUATION COMPLEXITY: Moderate   GOALS: Goals reviewed with patient? Yes  SHORT TERM GOALS: Target date: 08/11/2023    Patient will be independent with HEP in order to improve functional outcomes. Baseline:  Goal status: MET 5/20  2.  Patient will report at least 25% improvement in symptoms for improved quality of life. Baseline:  Goal status: MET 08/11/23   LONG TERM GOALS: Target date: 09/08/2023    Patient will report at least 75% improvement in symptoms for improved quality of life. Baseline:  Goal status: IN PROGRESS (60% 9/30)  2.  Patient will improve modified oswestry score by at least 8 points in order to indicate improved tolerance to activity. Baseline:  Goal status: MET (improved by 6 points 5/20)  3.  Patient will demonstrate at least 25% improvement in lumbar ROM in all restricted planes for improved ability to move trunk while completing chores and cooking. Baseline:  Goal status: MET 5/20  4.  Patient  will be able to complete 5x STS in under 15  seconds in order to demonstrate improved functional mobility/strength Baseline: see above Goal status: In progress - 9/30  5.  Patient will demonstrate grade of 5/5 MMT grade in all tested musculature as evidence of improved strength to assist with stair ambulation and gait.   Baseline:  Goal status: IN PROGRESS 9/30     PLAN:  PT FREQUENCY: 2x/week  PT DURATION: 6 weeks  PLANNED INTERVENTIONS: [id number]- PT Re-evaluation, 97110-Therapeutic exercises, 97530- Therapeutic activity, 97112- Neuromuscular re-education, 97535- Self Care, [id number]- Manual therapy, [phone]- Gait training, [phone]- Orthotic Fit/training, [phone]- Canalith repositioning, [id number]- Aquatic Therapy, [phone]- Splinting, Patient/Family education, Balance training, Stair training, Taping, Dry Needling, Joint mobilization, Joint manipulation, Spinal manipulation, Spinal mobilization, Scar mobilization, and DME instructions.  PLAN FOR NEXT SESSION: f/u with HEP, spinal mobility, core and glute and LE strength, functional strength    Rojean JONELLE Batten, PT 01/31/2024, 12:21 PM    [facility] [address] [place], [place], [address] Phone: [phone]   Fax:  [phone]   Referring diagnosis? M54.50 (ICD-10-CM) - Low back pain, unspecified back pain laterality, unspecified chronicity, unspecified whether sciatica present Treatment diagnosis? (if different than referring diagnosis) m54.59 What was this (referring dx) caused by? []  Surgery []  Fall [x]  Ongoing issue []  Arthritis []  Other: ____________  Laterality: []  Rt []  Lt [x]  Both  Check all possible CPT codes:  *CHOOSE 10 OR LESS*    See Planned Interventions listed in the Plan section of the Evaluation.

## 2024-01-31 ENCOUNTER — Encounter (HOSPITAL_BASED_OUTPATIENT_CLINIC_OR_DEPARTMENT_OTHER): Payer: Self-pay | Admitting: Physical Therapy

## 2024-01-31 ENCOUNTER — Ambulatory Visit (HOSPITAL_BASED_OUTPATIENT_CLINIC_OR_DEPARTMENT_OTHER): Admitting: Physical Therapy

## 2024-01-31 DIAGNOSIS — R2689 Other abnormalities of gait and mobility: Secondary | ICD-10-CM | POA: Diagnosis not present

## 2024-01-31 DIAGNOSIS — M6281 Muscle weakness (generalized): Secondary | ICD-10-CM | POA: Diagnosis not present

## 2024-01-31 DIAGNOSIS — M5459 Other low back pain: Secondary | ICD-10-CM | POA: Diagnosis not present

## 2024-02-02 DIAGNOSIS — Z1231 Encounter for screening mammogram for malignant neoplasm of breast: Secondary | ICD-10-CM | POA: Diagnosis not present

## 2024-02-02 LAB — HM MAMMOGRAPHY

## 2024-02-06 ENCOUNTER — Encounter: Payer: Self-pay | Admitting: Family Medicine

## 2024-02-06 NOTE — Therapy (Deleted)
 OUTPATIENT PHYSICAL THERAPY THORACOLUMBAR NOTE      Patient Name: [name] MRN: [id number] DOB:[birth year], 73 y.o., female Today's Date: 02/06/2024    END OF SESSION:        Past Medical History:  Diagnosis Date   Anginal pain 03/19/2012   saw Dr. Calhoun .. she thinks its more related to stomach issues   Arthritis    all over; mostly in my back (03/20/2012)   Breast cancer (HCC)    right (03/20/2012)   Cardiomyopathy    Chest pain 06/2005   Hospitalized, dystolic dysfunction   Chronic lower back pain    have 2 herniated discs; going to have to have a fusion (03/20/2012)   CKD (chronic kidney disease), stage III (HCC)    Family history of anesthesia complication    father has nausea and vomiting   Gallstones    GERD (gastroesophageal reflux disease)    H/O hiatal hernia    History of kidney stones    History of right mastectomy    Hypertension    patient states never had HTN, takes Hyzaar for heart   Hypothyroidism    Irritable bowel syndrome    Migraines    it's been a long time since I've had one (03/20/2012)   Osteoarthritis    Peripheral neuropathy    PONV (postoperative nausea and vomiting)    Sciatica    Sinus bradycardia    Sleep apnea    mild, no CPAP   Type II diabetes mellitus (HCC)    Past Surgical History:  Procedure Laterality Date   ABDOMINAL HYSTERECTOMY  1993   adenosin cardiolite   07/2005   (+) wall motion abnormality   AXILLARY LYMPH NODE DISSECTION  2003   squamous cell cancer   BACK SURGERY     CARDIAC CATHETERIZATION  ? 2005 / 2007   CHOLECYSTECTOMY N/A 01/24/2018   Procedure: LAPAROSCOPIC CHOLECYSTECTOMY;  Surgeon: Vanderbilt Ned, MD;  Location: [facility] OR;  Service: General;  Laterality: N/A;   CT abdomen and pelvis  02/2010   Same   ESOPHAGOGASTRODUODENOSCOPY  2005   GERD   KNEE ARTHROSCOPY  1990's   right (03/20/2012)   LUMBAR FUSION N/A 08/22/2014   Procedure: Right L2-3 and right L1-2 transforaminal  lumbar interbody fusion with pedicle screws, rods, sleeves, cages, local bone graft, Vivigen, cancellous chips;  Surgeon: Lynwood FORBES Better, MD;  Location: [facility] OR;  Service: Orthopedics;  Laterality: N/A;   LUMBAR LAMINECTOMY/DECOMPRESSION MICRODISCECTOMY N/A 02/04/2014   Procedure: RIGHT L2-3 MICRODISCECTOMY ;  Surgeon: Lynwood FORBES Better, MD;  Location: [facility] OR;  Service: Orthopedics;  Laterality: N/A;   MASTECTOMY WITH AXILLARY LYMPH NODE DISSECTION  12/2003   right (03/20/2012)   PARATHYROIDECTOMY Left 10/11/2022   Procedure: MINIMALLY INVASIVE LEFT INFERIOR PARATHYROIDECTOMY;  Surgeon: Rubin Calamity, MD;  Location: [facility] OR;  Service: General;  Laterality: Left;   POSTERIOR CERVICAL FUSION/FORAMINOTOMY  2001   SHOULDER ARTHROSCOPY W/ ROTATOR CUFF REPAIR  2003   Left   THYROIDECTOMY  1977   Right   TOTAL KNEE ARTHROPLASTY  2000   Right   US  of abdomen  07/2005   Fatty liver / kidney stones   Patient Active Problem List   Diagnosis Date Noted   Burning mouth syndrome 01/22/2024   Class 2 obesity 12/14/2023   Hyperglycemia due to type 2 diabetes mellitus (HCC) 12/14/2023   Urinary incontinence 08/09/2023   Metallic taste 05/11/2023   S/P parathyroidectomy 10/11/2022   Allergic rhinitis 07/25/2022   Syncope 07/21/2022  Sinus bradycardia 07/21/2022   Hammer toe of second toe of left foot 05/05/2021   Corns and callosities 05/05/2021   Hyperlipidemia associated with type 2 diabetes mellitus (HCC) 02/01/2021   Depression 02/01/2021   Localized osteoporosis without current pathological fracture 05/01/2020   Hyperparathyroidism 12/13/2019   Elevated hemoglobin 12/13/2019   Chronic renal impairment, stage 4 (severe) 10/28/2019   Anemia 10/28/2019   Physical exam 06/26/2019   Neck pain 04/18/2019   Ascending aortic aneurysm 04/17/2019   Nonspecific chest pain 04/15/2019   Neuropathic pain of right flank 12/27/2018   Dysphagia 12/27/2018   Postsurgical hypothyroidism 06/11/2018   Vitamin D   deficiency 06/11/2018   Multinodular goiter 06/11/2018   DM type 2 causing vascular disease (HCC)    Peripheral neuropathy    Migraine    Irritable bowel syndrome    Essential hypertension    History of right mastectomy    H/O hiatal hernia    GERD (gastroesophageal reflux disease)    Family history of anesthesia complication    Other chronic pain    Type 2 diabetes mellitus with diabetic chronic kidney disease (HCC)    Lower extremity edema    Chronic constipation 06/23/2015   Chronic diastolic heart failure (HCC) 11/17/2014   Herniated nucleus pulposus, L2-3 right 02/04/2014    Class: Acute   Spondylolisthesis of lumbar region 04/30/2012    Class: Chronic   Lumbar stenosis with neurogenic claudication 04/30/2012    Class: Chronic   SYNCOPE-CAROTID SINUS 09/16/2008   OSTEOARTHRITIS 08/23/2006   DDD (degenerative disc disease), cervical 08/23/2006   DISC DISEASE, LUMBAR 08/23/2006   Headache(784.0) 08/23/2006   BREAST CANCER, HX OF 08/23/2006   RENAL CALCULUS, HX OF 08/23/2006    PCP: Mahlon Comer BRAVO, MD   REFERRING PROVIDER: Georgina Ozell LABOR, MD  REFERRING DIAG: M54.50 (ICD-10-CM) - Low back pain, unspecified back pain laterality, unspecified chronicity, unspecified whether sciatica present  Rationale for Evaluation and Treatment: Rehabilitation  THERAPY DIAG:  No diagnosis found.  ONSET DATE: chronic   SUBJECTIVE:                                                                                                                                                                                           SUBJECTIVE STATEMENT: Pt reports she is feeling good today. Was able to stand the other day and make a stew with help fro her daughter.   PERTINENT HISTORY:  Chronic LBP, hx Lumbar surgery, CKD, hx breast cancer, DM, neuropathy, osteoporosis, does have spinal cord stimulator  PAIN:  Are you having pain? Yes: NPRS scale:  4/10 Pain location: low back, abdomen  Pain description: achy Aggravating factors: walking/standing Relieving factors: laying down  PRECAUTIONS: None  WEIGHT BEARING RESTRICTIONS: No  FALLS:  Has patient fallen in last 6 months? No  PLOF: Independent  PATIENT GOALS: stand up and cook   OBJECTIVE: (objective measures from initial evaluation unless otherwise dated)  PATIENT SURVEYS: Modified Oswestry 30/50  5/20: 24/50 10/17/23: 18 / 50 7/24: 11/10/23 30/50 12/14/23: 24/50 (48%) 01/09/24: 25/50  SCREENING FOR RED FLAGS: Bowel or bladder incontinence: No Spinal tumors: No Cauda equina syndrome: No Compression fracture: No Abdominal aneurysm: No  COGNITION: Overall cognitive status: Within functional limits for tasks assessed     SENSATION: WFL  POSTURE: rounded shoulders, forward head, decreased lumbar lordosis, increased thoracic kyphosis, and flexed trunk   PALPATION: TTP lumbar and thoracic paraspinals with greatest tenderness throughout lumbar spine.  LUMBAR ROM:   AROM eval 5/20 11/10/23 12/14/23  Flexion 25% limited Full 0% limited 0% limited   Extension 90% limited * 40% limited 40% limited 40% limited  Right lateral flexion 75% limited * 25% limited* 25% limited* 10%limited  Left lateral flexion 75% limited* 40% limited 25% limited 10% limited  Right rotation 50% limited* 15% limited 0% limited 10% limited  Left rotation 50% limited* Full 0% limited 10% limited   (Blank rows = not tested) * = pain/symptoms    LOWER EXTREMITY MMT:  unable to test extension as patient unable to get into prone  MMT Right eval Left eval Right 5/20 Left 5/20 Right 10/17/23 Left 10/17/23 Right 11/10/23 Left 11/10/23 Right 12/14/23 Left 12/14/23 Right  9/30 Left  9/30  Hip flexion 4+ 4+ 4+ 4 4+ 4+ 5 4+ 5 4+ 4+ 4+  Hip extension              Hip abduction 4- 3+ 4+ (seated) 4-(seated) 4+ (seated) 4 + (seated) 4+ 4+ 5 5  5   Hip adduction              Hip internal rotation              Hip external rotation               Knee flexion 4+ 4+ 4+ 4 4+ 4+ 5 5 5 5 5 5   Knee extension 4+ 4+ 4+ 4 4+ 4+ 5 5 5 5 5 5   Ankle dorsiflexion 4+ 4+ 5 4+          Ankle plantarflexion              Ankle inversion              Ankle eversion               (Blank rows = not tested) * = pain/symptoms    FUNCTIONAL TESTS:  5 times sit to stand: 27.59 seconds with UE use 08/08/23:  22.5s with UE use 5/20: 20.03s without UE use (lowered plinth) 10/17/23: 24.58 seconds without UE use in chair 11/10/23: 23.89 seconds with intermittent UE use in chair - increased difficulty due to R hip pain 12/14/23: 40.81 with intermittent UE use in chair-L knee pain 01/09/24: 25.37 with no use of UE in chair  GAIT: Distance walked: 100 feet Assistive device utilized: Single point cane Level of assistance: Modified independence Comments: labored, decreased foot clearance  TODAY'S TREATMENT:  DATE:  01/31/24 Nu step L5 x57min Seated clam Blue 2x30 PNF chops with pink ball (cues for core engagement)  Bilat ER with RTB  STS from plinth 2x10 Step up marches 2 2x  Step taps 4 inch step 3x20 Lateral step ups 4 inch step 2x10 Ball reaches with white weighted ball.   01/11/24 Nu step L5 x28min Seated lumbar flexion stretching 4x15sec Supine HSS with strap 3x30sec each (cues to relax neck) Hooklying marching 2x20 Seated clam Blue 2x30 -Seated assisted partial sit up using pink ball 2x10 -PNF chops with pink ball (cues for core engagement) 2x10ea Standing HR 2x10 -standing marching 2x10 Standing HR 2x10 STS from plinth 2x10   01/09/24 NuStep x5 min lvl 5  Reassessment  Discussion of POC, HEP  01/04/24 NuStep x5 min lvl 5  Leg extension machine 10# 3x10 Leg curl machine 3x20 at 10# (half reps) Lumbar ball roll outs 2x10 Supine HS stretch with strap 2x30 second bilaterally   01/02/24: Nu-step L5 x44min Seated  lumbar flexion stretch 6x10sec -manual HS and piriformis stretching R LE -hooklying marching with TrA 2x10 -Seated clam with GTB 2x30 -Hooklying adductor sqz 5 2x10 -Seated assisted partial sit up using pink ball 2x10 -PNF chops with pink ball (cues for core engagement) 2x10ea  12/28/23: Nu-step L5 xmin Seated lumbar flexion stretch 4x10sec -manual HS and ITB stretching R LE -seated marching with TrA 2x20 -DKTC with LES on physioball x39min -Seated clam with GTB x30 -Hooklying adductor sqz 5 2x10 -Seated assisted partial sit up using pink ball 2x10 -PNF chops with pink ball (cues for core engagement) 2x10ea -STS from elevated plinth 2x10 -resisted side stepping at rail-RTB at ankles- x1 lap  12/26/23: Nu-step L5 xmin Seated lumbar flexion stretch 4x10sec -LTR x20ea -manual HS and ITB stretching R LE -seated marching with TrA 2x20 -DKTC with LES on physioball x40min -Seated clam with GTB x30 -Hooklying adductor sqz 5 2x10 -Seated assisted partial sit up using pink ball 2x10 -PNF chops with pink ball (cues for core engagement) x10ea -STS from elevated plinth 2x10 -resisted side stepping at rail-RTB at ankles- x1 lap  12/19/23 Seated trunk flexion stretch 5 x 10 second holds Standing hip abduction RTB at knees 2 x 10  Standing hip extension RTB at knees 2 x 10  Seated Row RTB 3 x 15 Seated shoulder extension RTB 3 x 15 Palof press 1 x 10  12/14/23 Nu step L3 x41min MODI Updated MMT and ROM 5xSTS Review of goals Sit to stands 2x10 (cues for technique)  11/21/23 NuStep L5, UE/LE x 6 min Standing hip abduction RTB at knees 2 x 10  Standing hip extension RTB at knees 3 x 10  Seated trunk flexion stretch 5 x 10 second holds Lateral step up and over 2 x 10 Seated Row RTB 3 x 15 Seated shoulder extension RTB 2 x 20 Seated ab iso with green ball 2 x 10 with 5 second holds STS from elevated plinth 1x10  11/15/23 -NuStep L5, UE/LE x 5 min  -SKTC 30sec x3ea -LTR  x20ea -manual HS and ITB stretching R LE -supine marching with TrA 2x20 -Hooklying oblique reaches 2x10ea -DKTC with LES on physioball x61min -Seated clam with GTB 2x30 -Seated adductor sqz 5 2x10 -Seated assisted partial sit up using pink ball 2x10 -Seated fwd flexion stretch 10 x5 to each side  -PNF chops with pink ball (cues for core engagement) 2x10ea -STS from elevated plinth 2x10   11/13/23 -NuStep L5, UE/LE x 5 min  -  SKTC 30sec x3ea -LTR x10ea -supine marching with TrA 2x20 -Hooklying oblique reaches 2x10ea -DKTC with LES on physioball x14min -Seated clam with GTB x30 -Seated adductor sqz 5 2x10 -Seated assisted partial sit up using pink ball 2x10 -Seated fwd flexion stretch 10 x5  -PNF chops with pink ball (cues for core engagement) 2x10ea -STS from elevated plinth 2x10      PATIENT EDUCATION:  Education details: exercise rationale, progression/modification 11/10/23 reassessment findings, POC Person educated: Patient Education method: Explanation, Demonstration, Education comprehension: verbalized understanding, returned demonstration, verbal cues required, and tactile cues required  HOME EXERCISE PROGRAM: Access Code: [facility] URL: [email or web address]   ASSESSMENT: CLINICAL IMPRESSION:  Tolerated exercise well with no reported pain. Session with focus on strength, balance and endurance today. Pt tolerated a lot of standing activity with minimal seated rest breaks. Will continue to benefit from therapy to address remaining limitations.     OBJECTIVE IMPAIRMENTS: Abnormal gait, decreased activity tolerance, decreased balance, decreased endurance, decreased mobility, difficulty walking, decreased ROM, decreased strength, increased muscle spasms, impaired flexibility, impaired sensation, improper body mechanics, postural dysfunction, and pain.   ACTIVITY LIMITATIONS: carrying, lifting, bending, sitting, standing, squatting, stairs, transfers, reach  over head, hygiene/grooming, locomotion level, and caring for others  PARTICIPATION LIMITATIONS: meal prep, cleaning, laundry, shopping, community activity, and yard work  PERSONAL FACTORS: Fitness, Time since onset of injury/illness/exacerbation, and 3+ comorbidities: Chronic LBP, hx Lumbar surgery, CKD, hx breast cancer, DM, neuropathy, osteoporosis  are also affecting patient's functional outcome.   REHAB POTENTIAL: Good  CLINICAL DECISION MAKING: Evolving/moderate complexity  EVALUATION COMPLEXITY: Moderate   GOALS: Goals reviewed with patient? Yes  SHORT TERM GOALS: Target date: 08/11/2023    Patient will be independent with HEP in order to improve functional outcomes. Baseline:  Goal status: MET 5/20  2.  Patient will report at least 25% improvement in symptoms for improved quality of life. Baseline:  Goal status: MET 08/11/23   LONG TERM GOALS: Target date: 09/08/2023    Patient will report at least 75% improvement in symptoms for improved quality of life. Baseline:  Goal status: IN PROGRESS (60% 9/30)  2.  Patient will improve modified oswestry score by at least 8 points in order to indicate improved tolerance to activity. Baseline:  Goal status: MET (improved by 6 points 5/20)  3.  Patient will demonstrate at least 25% improvement in lumbar ROM in all restricted planes for improved ability to move trunk while completing chores and cooking. Baseline:  Goal status: MET 5/20  4.  Patient will be able to complete 5x STS in under 15 seconds in order to demonstrate improved functional mobility/strength Baseline: see above Goal status: In progress - 9/30  5.  Patient will demonstrate grade of 5/5 MMT grade in all tested musculature as evidence of improved strength to assist with stair ambulation and gait.   Baseline:  Goal status: IN PROGRESS 9/30     PLAN:  PT FREQUENCY: 2x/week  PT DURATION: 6 weeks  PLANNED INTERVENTIONS: [id number]- PT Re-evaluation,  97110-Therapeutic exercises, 97530- Therapeutic activity, 97112- Neuromuscular re-education, 97535- Self Care, [id number]- Manual therapy, [phone]- Gait training, [phone]- Orthotic Fit/training, [phone]- Canalith repositioning, [id number]- Aquatic Therapy, [phone]- Splinting, Patient/Family education, Balance training, Stair training, Taping, Dry Needling, Joint mobilization, Joint manipulation, Spinal manipulation, Spinal mobilization, Scar mobilization, and DME instructions.  PLAN FOR NEXT SESSION: f/u with HEP, spinal mobility, core and glute and LE strength, functional strength    Rojean JONELLE Batten, PT 02/06/2024, 1:01 PM    [facility]  [facility] [address] [place], [place], [address] Phone: [phone]   Fax:  [phone]   Referring diagnosis? M54.50 (ICD-10-CM) - Low back pain, unspecified back pain laterality, unspecified chronicity, unspecified whether sciatica present Treatment diagnosis? (if different than referring diagnosis) m54.59 What was this (referring dx) caused by? []  Surgery []  Fall [x]  Ongoing issue []  Arthritis []  Other: ____________  Laterality: []  Rt []  Lt [x]  Both  Check all possible CPT codes:  *CHOOSE 10 OR LESS*    See Planned Interventions listed in the Plan section of the Evaluation.

## 2024-02-07 ENCOUNTER — Ambulatory Visit (HOSPITAL_BASED_OUTPATIENT_CLINIC_OR_DEPARTMENT_OTHER): Admitting: Physical Therapy

## 2024-02-07 ENCOUNTER — Encounter (HOSPITAL_BASED_OUTPATIENT_CLINIC_OR_DEPARTMENT_OTHER): Payer: Self-pay | Admitting: Physical Therapy

## 2024-02-07 DIAGNOSIS — M5459 Other low back pain: Secondary | ICD-10-CM | POA: Diagnosis not present

## 2024-02-07 DIAGNOSIS — R2689 Other abnormalities of gait and mobility: Secondary | ICD-10-CM

## 2024-02-07 DIAGNOSIS — M6281 Muscle weakness (generalized): Secondary | ICD-10-CM

## 2024-02-07 NOTE — Therapy (Addendum)
 OUTPATIENT PHYSICAL THERAPY THORACOLUMBAR TREATMENT      Patient Name: [name] MRN: [id number] DOB:[birth year], 73 y.o., female Today's Date: 02/07/2024    END OF SESSION:   PT End of Session - 02/07/24 1149     Visit Number 35    Number of Visits 48    Date for Recertification  02/20/24    Authorization Type [employer]    Authorization Time Period 18 visits approved from 11/13/2023-01/13/2024    Authorization - Visit Number 11    Authorization - Number of Visits 18    Progress Note Due on Visit 42    PT Start Time 1150    PT Stop Time 1230    PT Time Calculation (min) 40 min    Activity Tolerance Patient tolerated treatment well    Behavior During Therapy [facility] for tasks assessed/performed             Past Medical History:  Diagnosis Date   Anginal pain 03/19/2012   saw Dr. Calhoun .. she thinks its more related to stomach issues   Arthritis    all over; mostly in my back (03/20/2012)   Breast cancer (HCC)    right (03/20/2012)   Cardiomyopathy    Chest pain 06/2005   Hospitalized, dystolic dysfunction   Chronic lower back pain    have 2 herniated discs; going to have to have a fusion (03/20/2012)   CKD (chronic kidney disease), stage III (HCC)    Family history of anesthesia complication    father has nausea and vomiting   Gallstones    GERD (gastroesophageal reflux disease)    H/O hiatal hernia    History of kidney stones    History of right mastectomy    Hypertension    patient states never had HTN, takes Hyzaar for heart   Hypothyroidism    Irritable bowel syndrome    Migraines    it's been a long time since I've had one (03/20/2012)   Osteoarthritis    Peripheral neuropathy    PONV (postoperative nausea and vomiting)    Sciatica    Sinus bradycardia    Sleep apnea    mild, no CPAP   Type II diabetes mellitus (HCC)    Past Surgical History:  Procedure Laterality Date   ABDOMINAL HYSTERECTOMY  1993   adenosin  cardiolite   07/2005   (+) wall motion abnormality   AXILLARY LYMPH NODE DISSECTION  2003   squamous cell cancer   BACK SURGERY     CARDIAC CATHETERIZATION  ? 2005 / 2007   CHOLECYSTECTOMY N/A 01/24/2018   Procedure: LAPAROSCOPIC CHOLECYSTECTOMY;  Surgeon: Vanderbilt Ned, MD;  Location: [facility] OR;  Service: General;  Laterality: N/A;   CT abdomen and pelvis  02/2010   Same   ESOPHAGOGASTRODUODENOSCOPY  2005   GERD   KNEE ARTHROSCOPY  1990's   right (03/20/2012)   LUMBAR FUSION N/A 08/22/2014   Procedure: Right L2-3 and right L1-2 transforaminal lumbar interbody fusion with pedicle screws, rods, sleeves, cages, local bone graft, Vivigen, cancellous chips;  Surgeon: Lynwood FORBES Better, MD;  Location: [facility] OR;  Service: Orthopedics;  Laterality: N/A;   LUMBAR LAMINECTOMY/DECOMPRESSION MICRODISCECTOMY N/A 02/04/2014   Procedure: RIGHT L2-3 MICRODISCECTOMY ;  Surgeon: Lynwood FORBES Better, MD;  Location: [facility] OR;  Service: Orthopedics;  Laterality: N/A;   MASTECTOMY WITH AXILLARY LYMPH NODE DISSECTION  12/2003   right (03/20/2012)   PARATHYROIDECTOMY Left 10/11/2022   Procedure: MINIMALLY INVASIVE LEFT INFERIOR PARATHYROIDECTOMY;  Surgeon: Rubin Calamity,  MD;  Location: [facility] OR;  Service: General;  Laterality: Left;   POSTERIOR CERVICAL FUSION/FORAMINOTOMY  2001   SHOULDER ARTHROSCOPY W/ ROTATOR CUFF REPAIR  2003   Left   THYROIDECTOMY  1977   Right   TOTAL KNEE ARTHROPLASTY  2000   Right   US  of abdomen  07/2005   Fatty liver / kidney stones   Patient Active Problem List   Diagnosis Date Noted   Burning mouth syndrome 01/22/2024   Class 2 obesity 12/14/2023   Hyperglycemia due to type 2 diabetes mellitus (HCC) 12/14/2023   Urinary incontinence 08/09/2023   Metallic taste 05/11/2023   S/P parathyroidectomy 10/11/2022   Allergic rhinitis 07/25/2022   Syncope 07/21/2022   Sinus bradycardia 07/21/2022   Hammer toe of second toe of left foot 05/05/2021   Corns and callosities 05/05/2021    Hyperlipidemia associated with type 2 diabetes mellitus (HCC) 02/01/2021   Depression 02/01/2021   Localized osteoporosis without current pathological fracture 05/01/2020   Hyperparathyroidism 12/13/2019   Elevated hemoglobin 12/13/2019   Chronic renal impairment, stage 4 (severe) 10/28/2019   Anemia 10/28/2019   Physical exam 06/26/2019   Neck pain 04/18/2019   Ascending aortic aneurysm 04/17/2019   Nonspecific chest pain 04/15/2019   Neuropathic pain of right flank 12/27/2018   Dysphagia 12/27/2018   Postsurgical hypothyroidism 06/11/2018   Vitamin D  deficiency 06/11/2018   Multinodular goiter 06/11/2018   DM type 2 causing vascular disease (HCC)    Peripheral neuropathy    Migraine    Irritable bowel syndrome    Essential hypertension    History of right mastectomy    H/O hiatal hernia    GERD (gastroesophageal reflux disease)    Family history of anesthesia complication    Other chronic pain    Type 2 diabetes mellitus with diabetic chronic kidney disease (HCC)    Lower extremity edema    Chronic constipation 06/23/2015   Chronic diastolic heart failure (HCC) 11/17/2014   Herniated nucleus pulposus, L2-3 right 02/04/2014    Class: Acute   Spondylolisthesis of lumbar region 04/30/2012    Class: Chronic   Lumbar stenosis with neurogenic claudication 04/30/2012    Class: Chronic   SYNCOPE-CAROTID SINUS 09/16/2008   OSTEOARTHRITIS 08/23/2006   DDD (degenerative disc disease), cervical 08/23/2006   DISC DISEASE, LUMBAR 08/23/2006   Headache(784.0) 08/23/2006   BREAST CANCER, HX OF 08/23/2006   RENAL CALCULUS, HX OF 08/23/2006    PCP: Mahlon Comer BRAVO, MD   REFERRING PROVIDER: Georgina Ozell LABOR, MD  REFERRING DIAG: M54.50 (ICD-10-CM) - Low back pain, unspecified back pain laterality, unspecified chronicity, unspecified whether sciatica present  Rationale for Evaluation and Treatment: Rehabilitation  THERAPY DIAG:  Muscle weakness (generalized)  Other  abnormalities of gait and mobility  Other low back pain  ONSET DATE: chronic   SUBJECTIVE:  SUBJECTIVE STATEMENT: Reports today is a bad day and she's having a lot of pain in the same spot in her back   PERTINENT HISTORY:  Chronic LBP, hx Lumbar surgery, CKD, hx breast cancer, DM, neuropathy, osteoporosis, does have spinal cord stimulator  PAIN:  Are you having pain? Yes: NPRS scale:  4/10 Pain location: low back, abdomen  Pain description: achy Aggravating factors: walking/standing Relieving factors: laying down  PRECAUTIONS: None  WEIGHT BEARING RESTRICTIONS: No  FALLS:  Has patient fallen in last 6 months? No  PLOF: Independent  PATIENT GOALS: stand up and cook   OBJECTIVE: (objective measures from initial evaluation unless otherwise dated)  PATIENT SURVEYS: Modified Oswestry 30/50  5/20: 24/50 10/17/23: 18 / 50 7/24: 11/10/23 30/50 12/14/23: 24/50 (48%) 01/09/24: 25/50  SCREENING FOR RED FLAGS: Bowel or bladder incontinence: No Spinal tumors: No Cauda equina syndrome: No Compression fracture: No Abdominal aneurysm: No  COGNITION: Overall cognitive status: Within functional limits for tasks assessed     SENSATION: WFL  POSTURE: rounded shoulders, forward head, decreased lumbar lordosis, increased thoracic kyphosis, and flexed trunk   PALPATION: TTP lumbar and thoracic paraspinals with greatest tenderness throughout lumbar spine.  LUMBAR ROM:   AROM eval 5/20 11/10/23 12/14/23  Flexion 25% limited Full 0% limited 0% limited   Extension 90% limited * 40% limited 40% limited 40% limited  Right lateral flexion 75% limited * 25% limited* 25% limited* 10%limited  Left lateral flexion 75% limited* 40% limited 25% limited 10% limited  Right rotation 50% limited* 15% limited 0%  limited 10% limited  Left rotation 50% limited* Full 0% limited 10% limited   (Blank rows = not tested) * = pain/symptoms    LOWER EXTREMITY MMT:  unable to test extension as patient unable to get into prone  MMT Right eval Left eval Right 5/20 Left 5/20 Right 10/17/23 Left 10/17/23 Right 11/10/23 Left 11/10/23 Right 12/14/23 Left 12/14/23 Right  9/30 Left  9/30  Hip flexion 4+ 4+ 4+ 4 4+ 4+ 5 4+ 5 4+ 4+ 4+  Hip extension              Hip abduction 4- 3+ 4+ (seated) 4-(seated) 4+ (seated) 4 + (seated) 4+ 4+ 5 5  5   Hip adduction              Hip internal rotation              Hip external rotation              Knee flexion 4+ 4+ 4+ 4 4+ 4+ 5 5 5 5 5 5   Knee extension 4+ 4+ 4+ 4 4+ 4+ 5 5 5 5 5 5   Ankle dorsiflexion 4+ 4+ 5 4+          Ankle plantarflexion              Ankle inversion              Ankle eversion               (Blank rows = not tested) * = pain/symptoms    FUNCTIONAL TESTS:  5 times sit to stand: 27.59 seconds with UE use 08/08/23:  22.5s with UE use 5/20: 20.03s without UE use (lowered plinth) 10/17/23: 24.58 seconds without UE use in chair 11/10/23: 23.89 seconds with intermittent UE use in chair - increased difficulty due to R hip pain 12/14/23: 40.81 with intermittent UE use in chair-L knee pain 01/09/24: 25.37 with no use  of UE in chair  GAIT: Distance walked: 100 feet Assistive device utilized: Single point cane Level of assistance: Modified independence Comments: labored, decreased foot clearance  TODAY'S TREATMENT:                                                                                                                              DATE:  02/07/24 NuStep x5 min  STM to lumbar paraspinals  Supine pelvic 2x10  Supine marches x20 Seated clamshell 2x10  Seated adductor squeezes 3x10 Seated marches x20  01/31/24 Nu step L5 x38min Seated clam Blue 2x30 PNF chops with pink ball (cues for core engagement)  Bilat ER with RTB  STS from plinth  2x10 Step up marches 2 2x  Step taps 4 inch step 3x20 Lateral step ups 4 inch step 2x10 Ball reaches with Pratt weighted ball.   01/11/24 Nu step L5 x72min Seated lumbar flexion stretching 4x15sec Supine HSS with strap 3x30sec each (cues to relax neck) Hooklying marching 2x20 Seated clam Blue 2x30 -Seated assisted partial sit up using pink ball 2x10 -PNF chops with pink ball (cues for core engagement) 2x10ea Standing HR 2x10 -standing marching 2x10 Standing HR 2x10 STS from plinth 2x10  01/09/24 NuStep x5 min lvl 5  Reassessment  Discussion of POC, HEP  01/04/24 NuStep x5 min lvl 5  Leg extension machine 10# 3x10 Leg curl machine 3x20 at 10# (half reps) Lumbar ball roll outs 2x10 Supine HS stretch with strap 2x30 second bilaterally   01/02/24: Nu-step L5 x36min Seated lumbar flexion stretch 6x10sec -manual HS and piriformis stretching R LE -hooklying marching with TrA 2x10 -Seated clam with GTB 2x30 -Hooklying adductor sqz 5 2x10 -Seated assisted partial sit up using pink ball 2x10 -PNF chops with pink ball (cues for core engagement) 2x10ea  12/28/23: Nu-step L5 xmin Seated lumbar flexion stretch 4x10sec -manual HS and ITB stretching R LE -seated marching with TrA 2x20 -DKTC with LES on physioball x44min -Seated clam with GTB x30 -Hooklying adductor sqz 5 2x10 -Seated assisted partial sit up using pink ball 2x10 -PNF chops with pink ball (cues for core engagement) 2x10ea -STS from elevated plinth 2x10 -resisted side stepping at rail-RTB at ankles- x1 lap  12/26/23: Nu-step L5 xmin Seated lumbar flexion stretch 4x10sec -LTR x20ea -manual HS and ITB stretching R LE -seated marching with TrA 2x20 -DKTC with LES on physioball x74min -Seated clam with GTB x30 -Hooklying adductor sqz 5 2x10 -Seated assisted partial sit up using pink ball 2x10 -PNF chops with pink ball (cues for core engagement) x10ea -STS from elevated plinth 2x10 -resisted side stepping at  rail-RTB at ankles- x1 lap  12/19/23 Seated trunk flexion stretch 5 x 10 second holds Standing hip abduction RTB at knees 2 x 10  Standing hip extension RTB at knees 2 x 10  Seated Row RTB 3 x 15 Seated shoulder extension RTB 3 x 15 Palof press 1 x 10  12/14/23 Nu step L3 x4min  MODI Updated MMT and ROM 5xSTS Review of goals Sit to stands 2x10 (cues for technique)  11/21/23 NuStep L5, UE/LE x 6 min Standing hip abduction RTB at knees 2 x 10  Standing hip extension RTB at knees 3 x 10  Seated trunk flexion stretch 5 x 10 second holds Lateral step up and over 2 x 10 Seated Row RTB 3 x 15 Seated shoulder extension RTB 2 x 20 Seated ab iso with green ball 2 x 10 with 5 second holds STS from elevated plinth 1x10  11/15/23 -NuStep L5, UE/LE x 5 min  -SKTC 30sec x3ea -LTR x20ea -manual HS and ITB stretching R LE -supine marching with TrA 2x20 -Hooklying oblique reaches 2x10ea -DKTC with LES on physioball x63min -Seated clam with GTB 2x30 -Seated adductor sqz 5 2x10 -Seated assisted partial sit up using pink ball 2x10 -Seated fwd flexion stretch 10 x5 to each side  -PNF chops with pink ball (cues for core engagement) 2x10ea -STS from elevated plinth 2x10   11/13/23 -NuStep L5, UE/LE x 5 min  -SKTC 30sec x3ea -LTR x10ea -supine marching with TrA 2x20 -Hooklying oblique reaches 2x10ea -DKTC with LES on physioball x56min -Seated clam with GTB x30 -Seated adductor sqz 5 2x10 -Seated assisted partial sit up using pink ball 2x10 -Seated fwd flexion stretch 10 x5  -PNF chops with pink ball (cues for core engagement) 2x10ea -STS from elevated plinth 2x10  PATIENT EDUCATION:  Education details: exercise rationale, progression/modification 11/10/23 reassessment findings, POC Person educated: Patient Education method: Explanation, Demonstration, Education comprehension: verbalized understanding, returned demonstration, verbal cues required, and tactile cues required  HOME EXERCISE  PROGRAM: Access Code: [facility] URL: [email or web address]   ASSESSMENT: CLINICAL IMPRESSION:  Tolerated exercise well with no significant change in pain. Manual utilized to alleviate pain and improve functional mobility. Exercises focused on lower extremity strengthening within tolerable range to aid in pain management. Verbal cueing on core activation and proper breathing. Will continue to benefit from therapy to address remaining limitations.     OBJECTIVE IMPAIRMENTS: Abnormal gait, decreased activity tolerance, decreased balance, decreased endurance, decreased mobility, difficulty walking, decreased ROM, decreased strength, increased muscle spasms, impaired flexibility, impaired sensation, improper body mechanics, postural dysfunction, and pain.   ACTIVITY LIMITATIONS: carrying, lifting, bending, sitting, standing, squatting, stairs, transfers, reach over head, hygiene/grooming, locomotion level, and caring for others  PARTICIPATION LIMITATIONS: meal prep, cleaning, laundry, shopping, community activity, and yard work  PERSONAL FACTORS: Fitness, Time since onset of injury/illness/exacerbation, and 3+ comorbidities: Chronic LBP, hx Lumbar surgery, CKD, hx breast cancer, DM, neuropathy, osteoporosis  are also affecting patient's functional outcome.   REHAB POTENTIAL: Good  CLINICAL DECISION MAKING: Evolving/moderate complexity  EVALUATION COMPLEXITY: Moderate   GOALS: Goals reviewed with patient? Yes  SHORT TERM GOALS: Target date: 08/11/2023    Patient will be independent with HEP in order to improve functional outcomes. Baseline:  Goal status: MET 5/20  2.  Patient will report at least 25% improvement in symptoms for improved quality of life. Baseline:  Goal status: MET 08/11/23   LONG TERM GOALS: Target date: 09/08/2023    Patient will report at least 75% improvement in symptoms for improved quality of life. Baseline:  Goal status: IN PROGRESS (60%  9/30)  2.  Patient will improve modified oswestry score by at least 8 points in order to indicate improved tolerance to activity. Baseline:  Goal status: MET (improved by 6 points 5/20)  3.  Patient will demonstrate at least 25% improvement in lumbar ROM in all  restricted planes for improved ability to move trunk while completing chores and cooking. Baseline:  Goal status: MET 5/20  4.  Patient will be able to complete 5x STS in under 15 seconds in order to demonstrate improved functional mobility/strength Baseline: see above Goal status: In progress - 9/30  5.  Patient will demonstrate grade of 5/5 MMT grade in all tested musculature as evidence of improved strength to assist with stair ambulation and gait.   Baseline:  Goal status: IN PROGRESS 9/30     PLAN:  PT FREQUENCY: 2x/week  PT DURATION: 6 weeks  PLANNED INTERVENTIONS: [id number]- PT Re-evaluation, 97110-Therapeutic exercises, 97530- Therapeutic activity, 97112- Neuromuscular re-education, 97535- Self Care, [id number]- Manual therapy, [phone]- Gait training, [phone]- Orthotic Fit/training, [phone]- Canalith repositioning, [id number]- Aquatic Therapy, [phone]- Splinting, Patient/Family education, Balance training, Stair training, Taping, Dry Needling, Joint mobilization, Joint manipulation, Spinal manipulation, Spinal mobilization, Scar mobilization, and DME instructions.  PLAN FOR NEXT SESSION: f/u with HEP, spinal mobility, core and glute and LE strength, functional strength    Lili Finder, Student-PT 02/07/2024, 12:31 PM  This entire session was performed under direct supervision and direction of a licensed therapist/therapist assistant . I have personally read, edited and approve of the note as written. 12:36 PM, 02/07/24 Prentice CANDIE Stains PT, DPT Physical Therapist at [facility] [address] [place], [place], [address] Phone: [phone]   Fax:   [phone]   Referring diagnosis? M54.50 (ICD-10-CM) - Low back pain, unspecified back pain laterality, unspecified chronicity, unspecified whether sciatica present Treatment diagnosis? (if different than referring diagnosis) m54.59 What was this (referring dx) caused by? []  Surgery []  Fall [x]  Ongoing issue []  Arthritis []  Other: ____________  Laterality: []  Rt []  Lt [x]  Both  Check all possible CPT codes:  *CHOOSE 10 OR LESS*    See Planned Interventions listed in the Plan section of the Evaluation.

## 2024-02-09 ENCOUNTER — Encounter (HOSPITAL_BASED_OUTPATIENT_CLINIC_OR_DEPARTMENT_OTHER): Admitting: Physical Therapy

## 2024-02-12 ENCOUNTER — Encounter: Payer: Self-pay | Admitting: Radiology

## 2024-03-27 ENCOUNTER — Other Ambulatory Visit: Payer: Self-pay | Admitting: Family Medicine

## 2024-04-17 ENCOUNTER — Ambulatory Visit: Admitting: Nurse Practitioner

## 2024-04-17 ENCOUNTER — Encounter: Payer: Self-pay | Admitting: Nurse Practitioner

## 2024-04-17 VITALS — BP 126/76 | HR 59 | Ht 64.0 in | Wt 189.6 lb

## 2024-04-17 DIAGNOSIS — Z7984 Long term (current) use of oral hypoglycemic drugs: Secondary | ICD-10-CM

## 2024-04-17 DIAGNOSIS — I10 Essential (primary) hypertension: Secondary | ICD-10-CM | POA: Diagnosis not present

## 2024-04-17 DIAGNOSIS — Z794 Long term (current) use of insulin: Secondary | ICD-10-CM | POA: Diagnosis not present

## 2024-04-17 DIAGNOSIS — Z789 Other specified health status: Secondary | ICD-10-CM | POA: Diagnosis not present

## 2024-04-17 DIAGNOSIS — E1159 Type 2 diabetes mellitus with other circulatory complications: Secondary | ICD-10-CM | POA: Diagnosis not present

## 2024-04-17 DIAGNOSIS — E890 Postprocedural hypothyroidism: Secondary | ICD-10-CM

## 2024-04-17 DIAGNOSIS — E559 Vitamin D deficiency, unspecified: Secondary | ICD-10-CM

## 2024-04-17 LAB — POCT GLYCOSYLATED HEMOGLOBIN (HGB A1C): Hemoglobin A1C: 8.5 % — AB (ref 4.0–5.6)

## 2024-04-17 NOTE — Progress Notes (Signed)
 "                                                                             04/17/2024, 11:40 AM   Endocrinology follow-up note   Subjective:    Patient ID: [name], female    DOB: 06/05/1950.  [name] is being seen in follow-up for management of currently uncontrolled,complicated symptomatic type 2 diabetes and associated hypertension, hypothyroidism. PMD :  Mahlon Comer BRAVO, MD.   Past Medical History:  Diagnosis Date   Anginal pain 03/19/2012   saw Dr. Calhoun .. she thinks its more related to stomach issues   Arthritis    all over; mostly in my back (03/20/2012)   Breast cancer (HCC)    right (03/20/2012)   Cardiomyopathy    Chest pain 06/2005   Hospitalized, dystolic dysfunction   Chronic lower back pain    have 2 herniated discs; going to have to have a fusion (03/20/2012)   CKD (chronic kidney disease), stage III (HCC)    Family history of anesthesia complication    father has nausea and vomiting   Gallstones    GERD (gastroesophageal reflux disease)    H/O hiatal hernia    History of kidney stones    History of right mastectomy    Hypertension    patient states never had HTN, takes Hyzaar for heart   Hypothyroidism    Irritable bowel syndrome    Migraines    it's been a long time since I've had one (03/20/2012)   Osteoarthritis    Peripheral neuropathy    PONV (postoperative nausea and vomiting)    Sciatica    Sinus bradycardia    Sleep apnea    mild, no CPAP   Type II diabetes mellitus (HCC)    Past Surgical History:  Procedure Laterality Date   ABDOMINAL HYSTERECTOMY  1993   adenosin cardiolite   07/2005   (+) wall motion abnormality   AXILLARY LYMPH NODE DISSECTION  2003   squamous cell cancer   BACK SURGERY     CARDIAC CATHETERIZATION  ? 2005 / 2007   CHOLECYSTECTOMY N/A 01/24/2018   Procedure: LAPAROSCOPIC CHOLECYSTECTOMY;  Surgeon: Vanderbilt Ned, MD;  Location: [facility] OR;  Service: General;  Laterality: N/A;   CT  abdomen and pelvis  02/2010   Same   ESOPHAGOGASTRODUODENOSCOPY  2005   GERD   KNEE ARTHROSCOPY  1990's   right (03/20/2012)   LUMBAR FUSION N/A 08/22/2014   Procedure: Right L2-3 and right L1-2 transforaminal lumbar interbody fusion with pedicle screws, rods, sleeves, cages, local bone graft, Vivigen, cancellous chips;  Surgeon: Lynwood BRAVO Better, MD;  Location: [facility] OR;  Service: Orthopedics;  Laterality: N/A;   LUMBAR LAMINECTOMY/DECOMPRESSION MICRODISCECTOMY N/A 02/04/2014   Procedure: RIGHT L2-3 MICRODISCECTOMY ;  Surgeon: Lynwood BRAVO Better, MD;  Location: [facility] OR;  Service: Orthopedics;  Laterality: N/A;   MASTECTOMY WITH AXILLARY LYMPH NODE DISSECTION  12/2003   right (03/20/2012)   PARATHYROIDECTOMY Left 10/11/2022   Procedure: MINIMALLY INVASIVE LEFT INFERIOR PARATHYROIDECTOMY;  Surgeon: Rubin Calamity, MD;  Location: [facility] OR;  Service: General;  Laterality: Left;   POSTERIOR CERVICAL FUSION/FORAMINOTOMY  2001   SHOULDER ARTHROSCOPY W/ ROTATOR CUFF REPAIR  2003   Left   THYROIDECTOMY  1977   Right   TOTAL KNEE ARTHROPLASTY  2000   Right   US  of abdomen  07/2005   Fatty liver / kidney stones   Social History   Socioeconomic History   Marital status: Married    Spouse name: Guillermina   Number of children: 1   Years of education: Not on file   Highest education level: 12th grade  Occupational History   Occupation: Retired since 2003  Tobacco Use   Smoking status: Never   Smokeless tobacco: Never  Vaping Use   Vaping status: Never Used  Substance and Sexual Activity   Alcohol use: No   Drug use: No   Sexual activity: Not Currently  Other Topics Concern   Not on file  Social History Narrative   Lives with husband/2025   Social Drivers of Health   Tobacco Use: Low Risk (04/17/2024)   Patient History    Smoking Tobacco Use: Never    Smokeless Tobacco Use: Never    Passive Exposure: Not on file  Financial Resource Strain: Low Risk (01/09/2024)   Overall Financial Resource  Strain (CARDIA)    Difficulty of Paying Living Expenses: Not very hard  Food Insecurity: No Food Insecurity (01/09/2024)   Epic    Worried About Radiation Protection Practitioner of Food in the Last Year: Never true    Ran Out of Food in the Last Year: Never true  Transportation Needs: No Transportation Needs (01/09/2024)   Epic    Lack of Transportation (Medical): No    Lack of Transportation (Non-Medical): No  Physical Activity: Inactive (01/10/2024)   Exercise Vital Sign    Days of Exercise per Week: 0 days    Minutes of Exercise per Session: Not on file  Stress: No Stress Concern Present (01/09/2024)   [employer] of Occupational Health - Occupational Stress Questionnaire    Feeling of Stress: Not at all  Social Connections: Socially Integrated (01/09/2024)   Social Connection and Isolation Panel    Frequency of Communication with Friends and Family: More than three times a week    Frequency of Social Gatherings with Friends and Family: Patient declined    Attends Religious Services: More than 4 times per year    Active Member of Clubs or Organizations: Yes    Attends Banker Meetings: More than 4 times per year    Marital Status: Married  Depression (PHQ2-9): Low Risk (01/22/2024)   Depression (PHQ2-9)    PHQ-2 Score: 4  Recent Concern: Depression (PHQ2-9) - Medium Risk (01/10/2024)   Depression (PHQ2-9)    PHQ-2 Score: 6  Alcohol Screen: Low Risk (01/05/2023)   Alcohol Screen    Last Alcohol Screening Score (AUDIT): 0  Housing: Unknown (01/09/2024)   Epic    Unable to Pay for Housing in the Last Year: No    Number of Times Moved in the Last Year: Not on file    Homeless in the Last Year: No  Utilities: Not At Risk (01/10/2024)   Epic    Threatened with loss of utilities: No  Health Literacy: Adequate Health Literacy (01/10/2024)   B1300 Health Literacy    Frequency of need for help with medical instructions: Never   Outpatient Encounter Medications as of 04/17/2024  Medication  Sig   acetaminophen  (TYLENOL ) 500 MG tablet Take 1,000 mg by mouth every 6 (six) hours as needed for moderate pain, headache or mild pain.   Alcohol Swabs (B-D SINGLE USE  SWABS REGULAR) PADS Use as directed.   aspirin  81 MG tablet Take 81 mg by mouth daily.   azelastine  (ASTELIN ) 0.1 % nasal spray Place 1 spray into both nostrils 2 (two) times daily. Use in each nostril as directed   Blood Glucose Monitoring Suppl (TRUE METRIX AIR GLUCOSE METER) w/Device KIT 1 kit by Does not apply route 3 (three) times daily.   cetirizine  (ZYRTEC ) 10 MG tablet TAKE 1 TABLET BY MOUTH EVERY DAY   Cholecalciferol  (VITAMIN D ) 50 MCG (2000 UT) tablet Take 2,000 Units by mouth daily.   clonazePAM  (KLONOPIN ) 0.5 MG tablet Take 1 tablet (0.5 mg total) by mouth 2 (two) times daily.   COMIRNATY syringe    cyanocobalamin  2000 MCG tablet Take 2,000 mcg by mouth daily.   dapagliflozin  propanediol (FARXIGA ) 10 MG TABS tablet Take 10 mg by mouth daily.   fluticasone  (FLONASE ) 50 MCG/ACT nasal spray Place 2 sprays into both nostrils daily.   glucose blood (TRUE METRIX BLOOD GLUCOSE TEST) test strip Use as instructed   hydrALAZINE  (APRESOLINE ) 50 MG tablet Take 1 tablet (50 mg total) by mouth 3 (three) times daily.   insulin  aspart protamine - aspart (NOVOLOG  MIX 70/30 FLEXPEN) (70-30) 100 UNIT/ML FlexPen Inject 25 Units into the skin 2 (two) times daily with a meal.   levothyroxine  (SYNTHROID ) 100 MCG tablet Take 1 tablet (100 mcg total) by mouth daily.   losartan  (COZAAR ) 25 MG tablet Take 1 tablet (25 mg total) by mouth daily.   metoCLOPramide  (REGLAN ) 10 MG tablet TAKE 1 TABLET FOUR TIMES DAILY   Polyethyl Glycol-Propyl Glycol (SYSTANE ULTRA) 0.4-0.3 % SOLN Apply 1-2 drops to eye daily as needed (dry eyes).   polyethylene glycol (MIRALAX  / GLYCOLAX ) packet Take 17 g by mouth daily as needed for moderate constipation (MIX AND DRINK).   rosuvastatin  (CRESTOR ) 20 MG tablet TAKE 1 TABLET AT BEDTIME   SUMAtriptan  (IMITREX ) 50  MG tablet TAKE 1 TAB EVERY 2 HRS AS NEEDED FOR MIGRAINE. MAY REPEAT IN 2 HOURS IF HEADACHE PERSISTS OR RECURS. (Patient taking differently: Take 50 mg by mouth every 2 (two) hours as needed for migraine (May repeat in 2 hours if headache persists or recurs.).)   torsemide  (DEMADEX ) 20 MG tablet Take 1 tablet (20 mg total) by mouth 2 (two) times daily.   TRUEplus Lancets 33G MISC USE AS DIRECTED   No facility-administered encounter medications on file as of 04/17/2024.    ALLERGIES: Allergies  Allergen Reactions   Dilaudid  [Hydromorphone  Hcl] Nausea And Vomiting   Lipitor [Atorvastatin ] Other (See Comments)    Body & Muscle Aches   Adhesive [Tape] Rash   Ampicillin Rash and Other (See Comments)    Also developed welts on her hands when she took it in the hospital   Latex Itching, Dermatitis, Rash and Other (See Comments)    Patient had a reaction to tape after surgery and they told her she was allergic to Latex    Penicillins Rash         VACCINATION STATUS: Immunization History  Administered Date(s) Administered   Fluad Quad(high Dose 65+) 12/13/2019, 02/01/2021, 01/06/2022   INFLUENZA, HIGH DOSE SEASONAL PF 01/10/2017, 01/02/2023   Influenza Split 01/31/2011   Influenza Whole 02/23/2007   Influenza,inj,Quad PF,6+ Mos 01/07/2014, 02/09/2016, 01/01/2018, 12/03/2018   Influenza-Unspecified 01/10/2012   PFIZER Comirnaty(Gray Top)Covid-19 Tri-Sucrose Vaccine 05/24/2019, 06/18/2019, 01/11/2020, 08/15/2020   PFIZER(Purple Top)SARS-COV-2 Vaccination 05/24/2019, 06/18/2019, 01/11/2020   Pfizer(Comirnaty)Fall Seasonal Vaccine 12 years and older 01/08/2024   Pneumococcal Conjugate-13 05/16/2019  Pneumococcal Polysaccharide-23 08/25/2014   Tdap 04/20/2011, 07/20/2022    Diabetes She presents for her follow-up diabetic visit. She has type 2 diabetes mellitus. Onset time: She was diagnosed at approximate age of 38 years. Her disease course has been fluctuating. There are no hypoglycemic  associated symptoms. Pertinent negatives for hypoglycemia include no confusion, pallor, seizures or tremors. Associated symptoms include fatigue, polydipsia and polyuria. Pertinent negatives for diabetes include no foot ulcerations, no polyphagia and no weight loss. Pertinent negatives for hypoglycemia complications include no nocturnal hypoglycemia. Symptoms are improving. Diabetic complications include heart disease and nephropathy. Risk factors for coronary artery disease include diabetes mellitus, hypertension, obesity, sedentary lifestyle, post-menopausal and dyslipidemia. Current diabetic treatment includes insulin  injections and oral agent (monotherapy). She is compliant with treatment most of the time. Her weight is stable. She is following a generally unhealthy diet. When asked about meal planning, she reported none. She has not had a previous visit with a dietitian. She never participates in exercise. Her home blood glucose trend is fluctuating minimally. Her breakfast blood glucose range is generally 180-200 mg/dl. Her lunch blood glucose range is generally >200 mg/dl. Her dinner blood glucose range is generally >200 mg/dl. Her bedtime blood glucose range is generally 180-200 mg/dl. Her overall blood glucose range is 180-200 mg/dl. (She presents today with her CGM showing slightly above target glycemic profile overall.  Her POCT A1c today is 8.5%, increasing from last visit of 7.9%.  Analysis of her CGM shows TIR 28%, TAR 72%, TBR 0% with a GMI of 8.3%.  She notes her appetite is decreased, has to force herself to eat a second meal even though she does not want one.) An ACE inhibitor/angiotensin II receptor blocker is being taken. She does not see a podiatrist.Eye exam is current.  Thyroid  Problem Presents for follow-up visit. Symptoms include depressed mood and fatigue. Patient reports no cold intolerance, diarrhea, heat intolerance, tremors, weight gain or weight loss. The symptoms have been stable.      Review of systems  Constitutional: + stable body weight,  current Body mass index is 32.54 kg/m. , no fatigue, no subjective hyperthermia, no subjective hypothermia Eyes: no blurry vision, no xerophthalmia ENT: no sore throat, no nodules palpated in throat, no dysphagia/odynophagia, no hoarseness Cardiovascular: no chest pain, no shortness of breath, no palpitations Respiratory: no cough, no shortness of breath Gastrointestinal: no nausea/vomiting/diarrhea Musculoskeletal: c/o chronic back pain- has spinal stimulator placed Skin: no rashes, no hyperemia Neurological: no tremors, + numbness/tingling, cold sensation to BLE, no dizziness Psychiatric: no depression, no anxiety    Objective:    BP 126/76 (BP Location: Left Arm, Patient Position: Sitting, Cuff Size: Large)   Pulse (!) 59   Ht 5' 4 (1.626 m)   Wt 189 lb 9.6 oz (86 kg)   BMI 32.54 kg/m   Wt Readings from Last 3 Encounters:  04/17/24 189 lb 9.6 oz (86 kg)  01/22/24 187 lb 6 oz (85 kg)  01/10/24 191 lb (86.6 kg)    BP Readings from Last 3 Encounters:  04/17/24 126/76  01/22/24 128/78  12/15/23 130/70     Physical Exam- Limited  Constitutional:  Body mass index is 32.54 kg/m. , not in acute distress, normal state of mind Eyes:  EOMI, no exophthalmos Musculoskeletal: no gross deformities, strength intact in all four extremities, no gross restriction of joint movements Skin:  no rashes, no hyperemia Neurological: no tremor with outstretched hands   Diabetic Foot Exam - Simple   No data filed  Latest Ref Rng & Units 01/22/2024    1:39 PM 07/17/2023    3:07 PM 02/06/2023    2:45 PM  CMP  Glucose 70 - 99 mg/dL 846   831   BUN 6 - 23 mg/dL 30   26   Creatinine 9.59 - 1.20 mg/dL 7.63   8.35   Sodium 864 - 145 mEq/L 140   141   Potassium 3.5 - 5.1 mEq/L 4.6   4.8   Chloride 96 - 112 mEq/L 108   105   CO2 19 - 32 mEq/L 24   18   Calcium  8.4 - 10.5 mg/dL 8.6   8.8   Total Protein 6.0 - 8.3  g/dL 7.4  7.1  7.2   Total Bilirubin 0.2 - 1.2 mg/dL 0.5  0.5  0.4   Alkaline Phos 39 - 117 U/L 43  53  56   AST 0 - 37 U/L 13  21  22    ALT 0 - 35 U/L 7  9  7       Diabetic Labs (most recent): Lab Results  Component Value Date   HGBA1C 8.5 (A) 04/17/2024   HGBA1C 7.9 (A) 12/15/2023   HGBA1C 8.7 (A) 08/14/2023   MICROALBUR 362.3 (H) 01/22/2024   MICROALBUR 570.4 08/05/2019   MICROALBUR 188.7 06/01/2018     Lipid Panel ( most recent) Lipid Panel     Component Value Date/Time   CHOL 150 01/22/2024 1339   CHOL 162 02/05/2021 0947   TRIG 202.0 (H) 01/22/2024 1339   HDL 42.20 01/22/2024 1339   HDL 51 02/05/2021 0947   CHOLHDL 4 01/22/2024 1339   VLDL 40.4 (H) 01/22/2024 1339   LDLCALC 67 01/22/2024 1339   LDLCALC 76 02/05/2021 0947   LDLCALC 60 06/02/2020 0816   LDLDIRECT 58.0 06/14/2022 1122      Assessment & Plan:   1) DM type 2 causing vascular disease (HCC)  - TANIEKA POWNALL has currently uncontrolled symptomatic type 2 DM since 74 years of age.  She presents today with her CGM showing slightly above target glycemic profile overall.  Her POCT A1c today is 8.5%, increasing from last visit of 7.9%.  Analysis of her CGM shows TIR 28%, TAR 72%, TBR 0% with a GMI of 8.3%.  She notes her appetite is decreased, has to force herself to eat a second meal even though she does not want one.  -her diabetes is complicated by retinopathy, obesity/sedentary life, and she remains at a high risk for more acute and chronic complications which include CAD, CVA, CKD, retinopathy, and neuropathy. These are all discussed in detail with her.  The following Lifestyle Medicine recommendations according to American College of Lifestyle Medicine [facility]) were discussed and offered to patient and she agrees to start the journey:  A. Whole Foods, Plant-based plate comprising of fruits and vegetables, plant-based proteins, whole-grain carbohydrates was discussed in detail with the patient.   A  list for source of those nutrients were also provided to the patient.  Patient will use only water  or unsweetened tea for hydration. B.  The need to stay away from risky substances including alcohol, smoking; obtaining 7 to 9 hours of restorative sleep, at least 150 minutes of moderate intensity exercise weekly, the importance of healthy social connections,  and stress reduction techniques were discussed. C.  A full color page of  Calorie density of various food groups per pound showing examples of each food groups was provided to the patient.  - Nutritional  counseling repeated/built upon at each appointment.  - The patient admits there is a room for improvement in their diet and drink choices. -  Suggestion is made for the patient to avoid simple carbohydrates from their diet including Cakes, Sweet Desserts / Pastries, Ice Cream, Soda (diet and regular), Sweet Tea, Candies, Chips, Cookies, Sweet Pastries, Store Bought Juices, Alcohol in Excess of 1-2 drinks a day, Artificial Sweeteners, Coffee Creamer, and Sugar-free Products. This will help patient to have stable blood glucose profile and potentially avoid unintended weight gain.   - I encouraged the patient to switch to unprocessed or minimally processed complex starch and increased protein intake (animal or plant source), fruits, and vegetables.   - Patient is advised to stick to a routine mealtimes to eat 3 meals a day and avoid unnecessary snacks (to snack only to correct hypoglycemia).  - she is following with Penny Crumpton, RDN, CDE for individualized diabetes education.  - I have approached her with the following individualized plan to manage diabetes and patient agrees:   -She will continue to benefit from her current simplified insulin  treatment regimen.    -She is advised to increase Novolin  70/30 to 30 units in the morning with her meal and continue to skip the evening dose given her recent nocturnal hypoglycemia.  She can continue  her Farxiga  10 mg po daily as prescribed by nephrology-however she does note frequent urination with this.  -She is encouraged to start monitoring blood glucose consistently 4 times per day (using her CGM), before meals and before bed and to call the clinic if she has readings less than 70 or greater than 200 for 3 tests in a row.  - she is warned not to take insulin  without proper monitoring per orders.  -She is not a candidate for Metformin  due to CKD.    - Patient specific target  A1c;  LDL, HDL, Triglycerides, were discussed in detail.  2) BP/HTN:  Her blood pressure is controlled to target for her age.  She is advised to continue her medication regimen as prescribed by PCP and nephrology.    3) Lipids/HPL:    Her most recent lipid panel from 07/17/23 shows controlled LDL of 52 and elevated triglycerides of 241.  She is advised to continue Crestor  20 mg po daily.    4)  Weight/Diet:  Her Body mass index is 32.54 kg/m.- clearly complicating her diabetes care.  She is a candidate for modest weight loss.  I discussed with her the fact that loss of 5 - 10% of her  current body weight will have the most impact on her diabetes management.  CDE Consult will be initiated . Exercise, and detailed carbohydrates information provided  -  detailed on discharge instructions.  5) Hypothyroidism-postsurgical There are no recent TFTs to review.  She is advised to continue Levothyroxine  100 mcg po daily before breakfast.  Will recheck TFTs prior to next visit and adjust dose accordingly.   - We discussed about the correct intake of her thyroid  hormone, on empty stomach at fasting, with water , separated by at least 30 minutes from breakfast and other medications,  and separated by more than 4 hours from calcium , iron , multivitamins, acid reflux medications (PPIs). -Patient is made aware of the fact that thyroid  hormone replacement is needed for life, dose to be adjusted by periodic monitoring of thyroid   function tests.  6) Multinodular goiter. -Her most recent thyroid /neck ultrasound from 09/12/18 revealed 2.9 cm nodule on the left lobe of  her thyroid  -She has surgically absent right thyroid  lobe.  Biopsy of 2.9 cm nodule on the left lobe is benign.    She will not need any surgical intervention at this time.    7) Vitamin D  deficiency -Her most recent vitamin D  level on 06/02/20 was 15.  She was taken off her vitamin D  supplement by her nephrologist. Will recommend she follow up with her nephrologist regarding low vitamin D  levels.  8) Chronic Care/Health Maintenance: -she is on ARB and is encouraged to initiate and continue to follow up with Ophthalmology, Dentist,  Podiatrist at least yearly or according to recommendations, and advised to  stay away from smoking. I have recommended yearly flu vaccine and pneumonia vaccine at least every 5 years; moderate intensity exercise for up to 150 minutes weekly; and  sleep for at least 7 hours a day.  - I advised patient to maintain close follow up with Tabori, Katherine E, MD for primary care needs.  I did encourage her to speak with nephrology regarding her frequent urination with incontinence.     I spent  45  minutes in the care of the patient today including review of labs from CMP, Lipids, Thyroid  Function, Hematology (current and previous including abstractions from other facilities); face-to-face time discussing  her blood glucose readings/logs, discussing hypoglycemia and hyperglycemia episodes and symptoms, medications doses, her options of short and long term treatment based on the latest standards of care / guidelines;  discussion about incorporating lifestyle medicine;  and documenting the encounter. Risk reduction counseling performed per USPSTF guidelines to reduce obesity and cardiovascular risk factors.     Please refer to Patient Instructions for Blood Glucose Monitoring and Insulin /Medications Dosing Guide  in media tab for additional  information. Please  also refer to  Patient Self Inventory in the Media  tab for reviewed elements of pertinent patient history.  [name] participated in the discussions, expressed understanding, and voiced agreement with the above plans.  All questions were answered to her satisfaction. she is encouraged to contact clinic should she have any questions or concerns prior to her return visit.   Follow up plan: - Return in about 4 months (around 08/15/2024) for Diabetes F/U with A1c in office, No previsit labs, Bring meter and logs.  Benton Rio, [facility] [address] [place], [place] [address] Phone: [phone] Fax: [phone]  04/17/2024, 11:40 AM    "

## 2024-04-18 ENCOUNTER — Telehealth: Payer: Self-pay | Admitting: *Deleted

## 2024-04-18 DIAGNOSIS — E1159 Type 2 diabetes mellitus with other circulatory complications: Secondary | ICD-10-CM

## 2024-04-18 NOTE — Telephone Encounter (Signed)
 Patient left a message for a call back.  When called the patient shared that she was seen yesterday. She was advised as noted per Whitney. -She is advised to increase Novolin  70/30 to 30 units in the morning with her meal and continue to skip the evening dose given her recent nocturnal hypoglycemia.  She can continue her Farxiga  10 mg po daily as prescribed by nephrology-however she does note frequent urination with this.   -She is encouraged to start monitoring blood glucose consistently 4 times per day (using her CGM), before meals and before bed and to call the clinic if she has readings less than 70 or greater than 200 for 3 tests in a row.  Patient states that she injected 30 unit of the 70/30 this morning and she took her Farxiga . Her husband had brought her a Engineer, Water for breakfast and this is all that she has ate. She says that she cannot eat meats as it makes her mouth burn. Patient reports that her BS have been [phone] and that she had not eaten anything else. Because it made her feel bloated. I explained that she needed to eat three well balanced meal a day, gave her the time frames. I encouraged her to eat her supper and told her to drink water . She is going to bring her CGM by tomorrow for a download and any recommendations. Advised the patient that we will be leaving at 11:45 on tomorrow, it is out short day.

## 2024-04-19 NOTE — Telephone Encounter (Signed)
 Yes, she absolutely would.  I will enter a referral.

## 2024-04-19 NOTE — Addendum Note (Signed)
 Addended by: Cyril Railey J on: 04/19/2024 06:53 AM   Modules accepted: Orders

## 2024-04-19 NOTE — Telephone Encounter (Signed)
 Patient was called and made aware of the referral being made to see Santana for her Nutrition.

## 2024-04-24 ENCOUNTER — Telehealth: Payer: Self-pay

## 2024-04-24 DIAGNOSIS — G8929 Other chronic pain: Secondary | ICD-10-CM

## 2024-04-24 NOTE — Telephone Encounter (Signed)
Referral placed for PT as requested.

## 2024-04-24 NOTE — Telephone Encounter (Signed)
Called patient and let her know the referral has been placed.

## 2024-04-24 NOTE — Telephone Encounter (Signed)
 Copied from CRM [id number]. Topic: Referral - Question >> Apr 24, 2024  3:34 PM Sophia H wrote: Reason for CRM: Patient states she had been seeing PT over at [facility] and is needing an updated referral for recent back issues. Wanting to know if PCP can send updated referral, please advise # [phone]   Genna to send referral?

## 2024-05-01 ENCOUNTER — Other Ambulatory Visit: Payer: Self-pay

## 2024-05-01 ENCOUNTER — Encounter (HOSPITAL_BASED_OUTPATIENT_CLINIC_OR_DEPARTMENT_OTHER): Payer: Self-pay | Admitting: Emergency Medicine

## 2024-05-01 ENCOUNTER — Emergency Department (HOSPITAL_BASED_OUTPATIENT_CLINIC_OR_DEPARTMENT_OTHER)

## 2024-05-01 ENCOUNTER — Emergency Department (HOSPITAL_BASED_OUTPATIENT_CLINIC_OR_DEPARTMENT_OTHER)
Admission: EM | Admit: 2024-05-01 | Discharge: 2024-05-01 | Disposition: A | Discharge status: Home / Self Care | Attending: Emergency Medicine | Admitting: Emergency Medicine

## 2024-05-01 DIAGNOSIS — Z794 Long term (current) use of insulin: Secondary | ICD-10-CM | POA: Insufficient documentation

## 2024-05-01 DIAGNOSIS — I129 Hypertensive chronic kidney disease with stage 1 through stage 4 chronic kidney disease, or unspecified chronic kidney disease: Secondary | ICD-10-CM | POA: Insufficient documentation

## 2024-05-01 DIAGNOSIS — Z91040 Latex allergy status: Secondary | ICD-10-CM | POA: Insufficient documentation

## 2024-05-01 DIAGNOSIS — Z79899 Other long term (current) drug therapy: Secondary | ICD-10-CM | POA: Insufficient documentation

## 2024-05-01 DIAGNOSIS — A084 Viral intestinal infection, unspecified: Secondary | ICD-10-CM | POA: Insufficient documentation

## 2024-05-01 DIAGNOSIS — N189 Chronic kidney disease, unspecified: Secondary | ICD-10-CM | POA: Insufficient documentation

## 2024-05-01 DIAGNOSIS — E1122 Type 2 diabetes mellitus with diabetic chronic kidney disease: Secondary | ICD-10-CM | POA: Insufficient documentation

## 2024-05-01 DIAGNOSIS — E1165 Type 2 diabetes mellitus with hyperglycemia: Secondary | ICD-10-CM | POA: Insufficient documentation

## 2024-05-01 DIAGNOSIS — E860 Dehydration: Secondary | ICD-10-CM | POA: Insufficient documentation

## 2024-05-01 DIAGNOSIS — Z7982 Long term (current) use of aspirin: Secondary | ICD-10-CM | POA: Insufficient documentation

## 2024-05-01 LAB — RESP PANEL BY RT-PCR (RSV, FLU A&B, COVID)  RVPGX2
Influenza A by PCR: NEGATIVE
Influenza B by PCR: NEGATIVE
Resp Syncytial Virus by PCR: NEGATIVE
SARS Coronavirus 2 by RT PCR: NEGATIVE

## 2024-05-01 LAB — CBC WITH DIFFERENTIAL/PLATELET
Abs Immature Granulocytes: 0.04 K/uL (ref 0.00–0.07)
Basophils Absolute: 0 K/uL (ref 0.0–0.1)
Basophils Relative: 0 %
Eosinophils Absolute: 0 K/uL (ref 0.0–0.5)
Eosinophils Relative: 0 %
HCT: 39.3 % (ref 36.0–46.0)
Hemoglobin: 12.5 g/dL (ref 12.0–15.0)
Immature Granulocytes: 0 %
Lymphocytes Relative: 5 %
Lymphs Abs: 0.5 K/uL — ABNORMAL LOW (ref 0.7–4.0)
MCH: 28.9 pg (ref 26.0–34.0)
MCHC: 31.8 g/dL (ref 30.0–36.0)
MCV: 90.8 fL (ref 80.0–100.0)
Monocytes Absolute: 0.3 K/uL (ref 0.1–1.0)
Monocytes Relative: 3 %
Neutro Abs: 9.4 K/uL — ABNORMAL HIGH (ref 1.7–7.7)
Neutrophils Relative %: 92 %
Platelets: 308 K/uL (ref 150–400)
RBC: 4.33 MIL/uL (ref 3.87–5.11)
RDW: 14.6 % (ref 11.5–15.5)
WBC: 10.2 K/uL (ref 4.0–10.5)
nRBC: 0 % (ref 0.0–0.2)

## 2024-05-01 LAB — BETA-HYDROXYBUTYRIC ACID: Beta-Hydroxybutyric Acid: 0.12 mmol/L (ref 0.05–0.27)

## 2024-05-01 LAB — COMPREHENSIVE METABOLIC PANEL WITH GFR
ALT: 7 U/L (ref 0–44)
AST: 21 U/L (ref 15–41)
Albumin: 3.6 g/dL (ref 3.5–5.0)
Alkaline Phosphatase: 62 U/L (ref 38–126)
Anion gap: 17 — ABNORMAL HIGH (ref 5–15)
BUN: 29 mg/dL — ABNORMAL HIGH (ref 8–23)
CO2: 21 mmol/L — ABNORMAL LOW (ref 22–32)
Calcium: 9 mg/dL (ref 8.9–10.3)
Chloride: 105 mmol/L (ref 98–111)
Creatinine, Ser: 2.84 mg/dL — ABNORMAL HIGH (ref 0.44–1.00)
GFR, Estimated: 17 mL/min — ABNORMAL LOW
Glucose, Bld: 317 mg/dL — ABNORMAL HIGH (ref 70–99)
Potassium: 4.1 mmol/L (ref 3.5–5.1)
Sodium: 142 mmol/L (ref 135–145)
Total Bilirubin: 0.4 mg/dL (ref 0.0–1.2)
Total Protein: 7.6 g/dL (ref 6.5–8.1)

## 2024-05-01 LAB — LIPASE, BLOOD: Lipase: 28 U/L (ref 11–51)

## 2024-05-01 LAB — CBG MONITORING, ED: Glucose-Capillary: 295 mg/dL — ABNORMAL HIGH (ref 70–99)

## 2024-05-01 MED ORDER — LACTATED RINGERS IV BOLUS
1000.0000 mL | Freq: Once | INTRAVENOUS | Status: AC
  Administered 2024-05-01: 1000 mL via INTRAVENOUS

## 2024-05-01 MED ORDER — LACTATED RINGERS IV BOLUS: 1000.0000 mL | Freq: Once | INTRAVENOUS | Status: DC

## 2024-05-01 MED ORDER — ACETAMINOPHEN 500 MG PO TABS
1000.0000 mg | ORAL_TABLET | Freq: Once | ORAL | Status: AC
  Administered 2024-05-01: 1000 mg via ORAL
  Filled 2024-05-01: qty 2

## 2024-05-01 MED ORDER — ONDANSETRON HCL 4 MG PO TABS: 4.0000 mg | ORAL_TABLET | Freq: Three times a day (TID) | ORAL | 0 refills | Status: AC | PRN

## 2024-05-01 MED ORDER — SODIUM CHLORIDE 0.9 % IV BOLUS: 1000.0000 mL | Freq: Once | INTRAVENOUS | Status: DC

## 2024-05-01 MED ORDER — ONDANSETRON HCL 4 MG/2ML IJ SOLN
4.0000 mg | Freq: Once | INTRAMUSCULAR | Status: AC
  Administered 2024-05-01: 4 mg via INTRAVENOUS
  Filled 2024-05-01: qty 2

## 2024-05-01 NOTE — Discharge Instructions (Signed)
 You were seen in the emerged department for nausea vomiting diarrhea dehydration Your results are most consistent with a viral GI infection We gave you IV fluids We also gave you Zofran  and have called this medication into your pharmacy to help with nausea and vomiting Take Tylenol  as directed for fevers and discomfort Keep well-hydrated Keep a close watch on your sugars as they may be elevated during this infection Return to the emergency room if you are unable to keep liquids or solids down or have uncontrolled vomiting Otherwise follow-up with your primary doctor within 1 week for reevaluation

## 2024-05-01 NOTE — ED Triage Notes (Signed)
 Reports n/v/d since 0430 this morning. States unable to walk d/t bilateral leg weakness. CBG over 300 at home

## 2024-05-01 NOTE — ED Provider Notes (Signed)
 " [place] [facility] AT [facility] Provider Note   CSN: [id number] Arrival date & time: 05/01/24  8182     Patient presents with: Diarrhea   [name] is a 74 y.o. female.  With a history of insulin -dependent type 2 diabetes, hypertension and CKD who presents to the ED for GI illness.  Nausea vomiting diarrhea started this morning and has persisted since the onset.  Associated dehydration and weakness.  Patient feels as though she has to weak to walk.  No focal deficits.  Febrile here.  No known sick contacts.  Denies chest pain or respiratory symptoms.  Hyperglycemia around 300 earlier today    Diarrhea      Prior to Admission medications  Medication Sig Start Date End Date Taking? Authorizing Provider  ondansetron  (ZOFRAN ) 4 MG tablet Take 1 tablet (4 mg total) by mouth every 8 (eight) hours as needed for up to 5 days for nausea or vomiting. 05/01/24 05/06/24 Yes Pamella Ozell LABOR, DO  acetaminophen  (TYLENOL ) 500 MG tablet Take 1,000 mg by mouth every 6 (six) hours as needed for moderate pain, headache or mild pain.    [provider]  Alcohol Swabs (B-D SINGLE USE SWABS REGULAR) PADS Use as directed. 12/02/20   Webb, Padonda B, FNP  aspirin  81 MG tablet Take 81 mg by mouth daily.    [provider]  azelastine  (ASTELIN ) 0.1 % nasal spray Place 1 spray into both nostrils 2 (two) times daily. Use in each nostril as directed 07/12/23   Tabori, Katherine E, MD  Blood Glucose Monitoring Suppl (TRUE METRIX AIR GLUCOSE METER) w/Device KIT 1 kit by Does not apply route 3 (three) times daily. 06/14/19   Tabori, Katherine E, MD  cetirizine  (ZYRTEC ) 10 MG tablet TAKE 1 TABLET BY MOUTH EVERY DAY 01/25/23   Tabori, Katherine E, MD  Cholecalciferol  (VITAMIN D ) 50 MCG (2000 UT) tablet Take 2,000 Units by mouth daily.    [provider]  clonazePAM  (KLONOPIN ) 0.5 MG tablet Take 1 tablet (0.5 mg total) by mouth 2 (two) times daily. 01/22/24   Tabori,  Katherine E, MD  COMIRNATY syringe  01/02/23   [provider]  cyanocobalamin  2000 MCG tablet Take 2,000 mcg by mouth daily.    [provider]  dapagliflozin  propanediol (FARXIGA ) 10 MG TABS tablet Take 10 mg by mouth daily.    [provider]  fluticasone  (FLONASE ) 50 MCG/ACT nasal spray Place 2 sprays into both nostrils daily. 12/14/23   Tabori, Katherine E, MD  glucose blood (TRUE METRIX BLOOD GLUCOSE TEST) test strip Use as instructed 12/02/20   Webb, Padonda B, FNP  hydrALAZINE  (APRESOLINE ) 50 MG tablet Take 1 tablet (50 mg total) by mouth 3 (three) times daily. 01/13/23   Pietro Redell RAMAN, MD  insulin  aspart protamine - aspart (NOVOLOG  MIX 70/30 FLEXPEN) (70-30) 100 UNIT/ML FlexPen Inject 25 Units into the skin 2 (two) times daily with a meal. 10/20/23   Therisa Benton JINNY, NP  levothyroxine  (SYNTHROID ) 100 MCG tablet Take 1 tablet (100 mcg total) by mouth daily. 12/14/23   Tabori, Katherine E, MD  losartan  (COZAAR ) 25 MG tablet Take 1 tablet (25 mg total) by mouth daily. 12/02/20   Webb, Padonda B, FNP  metoCLOPramide  (REGLAN ) 10 MG tablet TAKE 1 TABLET FOUR TIMES DAILY 03/27/24   Tabori, Katherine E, MD  Polyethyl Glycol-Propyl Glycol (SYSTANE ULTRA) 0.4-0.3 % SOLN Apply 1-2 drops to eye daily as needed (dry eyes).    [provider]  polyethylene glycol (MIRALAX  / GLYCOLAX ) packet Take 17 g by mouth daily as needed for moderate constipation (MIX AND DRINK).    [provider]  rosuvastatin  (CRESTOR ) 20 MG tablet TAKE 1 TABLET AT BEDTIME 03/27/24   Tabori, Katherine E, MD  SUMAtriptan  (IMITREX ) 50 MG tablet TAKE 1 TAB EVERY 2 HRS AS NEEDED FOR MIGRAINE. MAY REPEAT IN 2 HOURS IF HEADACHE PERSISTS OR RECURS. Patient taking differently: Take 50 mg by mouth every 2 (two) hours as needed for migraine (May repeat in 2 hours if headache persists or recurs.). 04/28/21   Tabori, Katherine E, MD  torsemide  (DEMADEX ) 20 MG tablet Take 1 tablet (20 mg total) by mouth  2 (two) times daily. 12/02/20   Webb, Padonda B, FNP  TRUEplus Lancets 33G MISC USE AS DIRECTED 03/19/24   Tabori, Katherine E, MD    Allergies: Dilaudid  [hydromorphone  hcl], Lipitor [atorvastatin ], Adhesive [tape], Ampicillin, Latex, and Penicillins    Review of Systems  Gastrointestinal:  Positive for diarrhea.    Updated Vital Signs BP (!) 162/72 (BP Location: Left Arm)   Pulse 78   Temp 100.3 F (37.9 C) (Oral)   Resp (!) 23   SpO2 96%   Physical Exam Vitals and nursing note reviewed.  HENT:     Head: Normocephalic and atraumatic.     Mouth/Throat:     Mouth: Mucous membranes are dry.  Eyes:     Pupils: Pupils are equal, round, and reactive to light.  Cardiovascular:     Rate and Rhythm: Normal rate and regular rhythm.  Pulmonary:     Effort: Pulmonary effort is normal.     Breath sounds: Normal breath sounds.  Abdominal:     Palpations: Abdomen is soft.     Tenderness: There is abdominal tenderness.     Comments: Lower abdominal tenderness without rebound rigidity or guarding  Musculoskeletal:        General: No swelling.  Skin:    General: Skin is warm and dry.  Neurological:     General: No focal deficit present.     Mental Status: She is alert.     Sensory: No sensory deficit.     Motor: No weakness.  Psychiatric:        Mood and Affect: Mood normal.     (all labs ordered are listed, but only abnormal results are displayed) Labs Reviewed  COMPREHENSIVE METABOLIC PANEL WITH GFR - Abnormal; Notable for the following components:      Result Value   CO2 21 (*)    Glucose, Bld 317 (*)    BUN 29 (*)    Creatinine, Ser 2.84 (*)    GFR, Estimated 17 (*)    Anion gap 17 (*)    All other components within normal limits  CBC WITH DIFFERENTIAL/PLATELET - Abnormal; Notable for the following components:   Neutro Abs 9.4 (*)    Lymphs Abs 0.5 (*)    All other components within normal limits  CBG MONITORING, ED - Abnormal; Notable for the following components:    Glucose-Capillary 295 (*)    All other components within normal limits  RESP PANEL BY RT-PCR (RSV, FLU A&B, COVID)  [id number]  LIPASE, BLOOD  URINALYSIS, W/ REFLEX TO CULTURE (INFECTION SUSPECTED)  BETA-HYDROXYBUTYRIC ACID    EKG: None  Radiology: CT ABDOMEN PELVIS WO CONTRAST Result Date: 05/01/2024 CLINICAL DATA:  Abdomen pain nausea vomiting diarrhea EXAM: CT ABDOMEN AND PELVIS WITHOUT CONTRAST TECHNIQUE: Multidetector CT imaging of the abdomen and pelvis was  performed following the standard protocol without IV contrast. RADIATION DOSE REDUCTION: This exam was performed according to the departmental dose-optimization program which includes automated exposure control, adjustment of the mA and/or kV according to patient size and/or use of iterative reconstruction technique. COMPARISON:  CT 10/25/2019 FINDINGS: Lower chest: Lung bases demonstrate no acute air space disease. Mitral calcifications. Hepatobiliary: No focal liver abnormality is seen. Status post cholecystectomy. No biliary dilatation. Pancreas: Unremarkable. No pancreatic ductal dilatation or surrounding inflammatory changes. Spleen: Normal in size without focal abnormality. Adrenals/Urinary Tract: Adrenal glands are normal. Kidneys show no hydronephrosis. Nonspecific perinephric fat stranding. Bilateral kidney stones. The bladder is unremarkable Stomach/Bowel: Moderate fluid distension of the stomach. Multiple loops of fluid-filled nondilated small bowel. Fluid within the colon. Negative appendix. No acute bowel wall thickening. Ligament of Treitz is slightly low lying and is not completely cross to the left of midline suggesting malrotation, but no obstruction is seen. Vascular/Lymphatic: Aortic atherosclerosis. No enlarged abdominal or pelvic lymph nodes. Reproductive: Status post hysterectomy. No adnexal masses. Other: No ascites or free air. Fat containing umbilical hernia. Bilateral fat containing inguinal hernias Musculoskeletal:  Posterior spinal fusion hardware grossly similar in appearance with lucency about the left superior transpedicular screw and breach of the superior endplate. IMPRESSION: 1. Moderate fluid distension of stomach with multiple loops of fluid-filled nondilated small bowel and fluid within the colon, findings are suggestive of ileus or enteritis. No convincing evidence for a bowel obstruction or bowel wall thickening. No evidence for bowel obstruction or bowel wall thickening. 2. Bilateral kidney stones without hydronephrosis. 3. Aortic atherosclerosis. Aortic Atherosclerosis (ICD10-I70.0). Electronically Signed   By: Luke Bun M.D.   On: 05/01/2024 21:14   DG Chest Portable 1 View Result Date: 05/01/2024 EXAM: 1 VIEW(S) XRAY OF THE CHEST 05/01/2024 07:03:00 PM COMPARISON: 07/06/2020 CLINICAL HISTORY: ?pna . Nausea, vomiting, diarrhea. FINDINGS: LINES, TUBES AND DEVICES: Spinal cord stimulator in place with tip in the mid thoracic spine region. LUNGS AND PLEURA: No focal pulmonary opacity. No pleural effusion. No pneumothorax. HEART AND MEDIASTINUM: No acute abnormality of the cardiac and mediastinal silhouettes. BONES AND SOFT TISSUES: No acute osseous abnormality. IMPRESSION: 1. No acute cardiopulmonary abnormality. Electronically signed by: Franky Crease MD 05/01/2024 07:07 PM EST RP Workstation: [id number]     Procedures   Medications Ordered in the ED  lactated ringers  bolus 1,000 mL (has no administration in time range)  ondansetron  (ZOFRAN ) injection 4 mg (4 mg Intravenous Given 05/01/24 1958)  lactated ringers  bolus 1,000 mL (0 mLs Intravenous Stopped 05/01/24 2123)  acetaminophen  (TYLENOL ) tablet 1,000 mg (1,000 mg Oral Given 05/01/24 2012)    Clinical Course as of 05/01/24 2252  Wed May 01, 2024  2244 Laboratory workup notable for hyperglycemia mildly elevated anion gap.  Bicarb of 21.  Not consistent with DKA.  Patient feeling much better after IV fluids Zofran  and Tylenol  here.  COVID flu RSV  all negative.  Her CGM currently 203.  CT abdomen pelvis most consistent with enteritis.  Chest x-ray without evidence of pneumonia.  Patient requesting discharge home given that she was able to keep down some ginger ale here and is feeling much better.  This would be appropriate and I will call in Zofran  for her.  She understands return precautions that would be worrisome for severe dehydration or inability to tolerate p.o. at home. [MP]    Clinical Course User Index [MP] Pamella Ozell LABOR, DO  Medical Decision Making 74 year old female with history as above presented to the ED for nausea vomiting diarrhea that all started this morning.  Associated weakness dehydration.  Feels too weak to walk.  No focal neurologic deficit on my exam.  Lower abdominal tenderness.  Febrile hypertensive here.  Differential diagnosis includes viral gastroenteritis, COVID flu RSV, DKA, intra-abdominal infection, UTI, pneumonia.  Will obtain laboratory workup along with CT abdomen pelvis and EKG.  Will provide IV fluids for rehydration and Zofran  for nausea vomiting and continue to monitor hemodynamic status closely.  Amount and/or Complexity of Data Reviewed Labs: ordered. Radiology: ordered.  Risk OTC drugs. Prescription drug management.        Final diagnoses:  Viral gastroenteritis  Hyperglycemia due to diabetes mellitus [facility])    ED Discharge Orders          Ordered    ondansetron  (ZOFRAN ) 4 MG tablet  Every 8 hours PRN        05/01/24 2246               Pamella Ozell LABOR, DO 05/01/24 2252  "

## 2024-05-16 NOTE — Progress Notes (Signed)
 My response to the following coding query: Please clarify the medical condition(s) necessitating the order for SARS,RSV,Flu A&B   Evaluate for viral illness

## 2024-06-11 ENCOUNTER — Ambulatory Visit (HOSPITAL_BASED_OUTPATIENT_CLINIC_OR_DEPARTMENT_OTHER): Admitting: Physical Therapy

## 2024-06-18 ENCOUNTER — Encounter (HOSPITAL_BASED_OUTPATIENT_CLINIC_OR_DEPARTMENT_OTHER): Admitting: Physical Therapy

## 2024-06-25 ENCOUNTER — Encounter (HOSPITAL_BASED_OUTPATIENT_CLINIC_OR_DEPARTMENT_OTHER): Admitting: Physical Therapy

## 2024-07-02 ENCOUNTER — Encounter (HOSPITAL_BASED_OUTPATIENT_CLINIC_OR_DEPARTMENT_OTHER): Admitting: Physical Therapy

## 2024-07-10 ENCOUNTER — Ambulatory Visit: Admitting: Orthopedic Surgery

## 2024-08-15 ENCOUNTER — Ambulatory Visit: Admitting: Nurse Practitioner
